# Patient Record
Sex: Female | Born: 1950 | Race: White | Hispanic: No | Smoking: Former smoker
Health system: Southern US, Community
[De-identification: ages and names within clinical notes are randomized; demographics above are authoritative.]

## PROBLEM LIST (undated history)

## (undated) DIAGNOSIS — IMO0002 Reserved for concepts with insufficient information to code with codable children: Secondary | ICD-10-CM

## (undated) DIAGNOSIS — R600 Localized edema: Secondary | ICD-10-CM

## (undated) DIAGNOSIS — E039 Hypothyroidism, unspecified: Secondary | ICD-10-CM

## (undated) DIAGNOSIS — Z9981 Dependence on supplemental oxygen: Secondary | ICD-10-CM

## (undated) DIAGNOSIS — C801 Malignant (primary) neoplasm, unspecified: Secondary | ICD-10-CM

## (undated) DIAGNOSIS — I10 Essential (primary) hypertension: Secondary | ICD-10-CM

## (undated) DIAGNOSIS — J449 Chronic obstructive pulmonary disease, unspecified: Secondary | ICD-10-CM

## (undated) DIAGNOSIS — F419 Anxiety disorder, unspecified: Secondary | ICD-10-CM

## (undated) DIAGNOSIS — G8929 Other chronic pain: Secondary | ICD-10-CM

## (undated) DIAGNOSIS — E119 Type 2 diabetes mellitus without complications: Secondary | ICD-10-CM

## (undated) DIAGNOSIS — M549 Dorsalgia, unspecified: Secondary | ICD-10-CM

## (undated) DIAGNOSIS — I509 Heart failure, unspecified: Secondary | ICD-10-CM

## (undated) DIAGNOSIS — Z9114 Patient's other noncompliance with medication regimen: Secondary | ICD-10-CM

## (undated) DIAGNOSIS — I2510 Atherosclerotic heart disease of native coronary artery without angina pectoris: Secondary | ICD-10-CM

## (undated) HISTORY — PX: TUBAL LIGATION: SHX77

## (undated) HISTORY — PX: HERNIA REPAIR: SHX51

## (undated) HISTORY — DX: Atherosclerotic heart disease of native coronary artery without angina pectoris: I25.10

---

## 1999-01-23 ENCOUNTER — Ambulatory Visit (HOSPITAL_COMMUNITY): Admission: RE | Admit: 1999-01-23 | Discharge: 1999-01-23 | Payer: Self-pay

## 2001-03-10 ENCOUNTER — Emergency Department (HOSPITAL_COMMUNITY): Admission: EM | Admit: 2001-03-10 | Discharge: 2001-03-10 | Payer: Self-pay | Admitting: Emergency Medicine

## 2001-08-13 ENCOUNTER — Ambulatory Visit (HOSPITAL_COMMUNITY): Admission: RE | Admit: 2001-08-13 | Discharge: 2001-08-13 | Payer: Self-pay | Admitting: Obstetrics and Gynecology

## 2001-08-13 ENCOUNTER — Encounter: Payer: Self-pay | Admitting: Family Medicine

## 2001-08-18 ENCOUNTER — Ambulatory Visit (HOSPITAL_COMMUNITY): Admission: RE | Admit: 2001-08-18 | Discharge: 2001-08-18 | Payer: Self-pay | Admitting: Family Medicine

## 2001-08-18 ENCOUNTER — Encounter: Payer: Self-pay | Admitting: Family Medicine

## 2001-11-14 ENCOUNTER — Emergency Department (HOSPITAL_COMMUNITY): Admission: EM | Admit: 2001-11-14 | Discharge: 2001-11-14 | Payer: Self-pay | Admitting: Emergency Medicine

## 2001-11-14 ENCOUNTER — Encounter: Payer: Self-pay | Admitting: Emergency Medicine

## 2001-12-31 ENCOUNTER — Encounter: Payer: Self-pay | Admitting: Family Medicine

## 2001-12-31 ENCOUNTER — Ambulatory Visit (HOSPITAL_COMMUNITY): Admission: RE | Admit: 2001-12-31 | Discharge: 2001-12-31 | Payer: Self-pay | Admitting: Family Medicine

## 2002-02-03 ENCOUNTER — Encounter (HOSPITAL_COMMUNITY): Admission: RE | Admit: 2002-02-03 | Discharge: 2002-03-05 | Payer: Self-pay | Admitting: Internal Medicine

## 2002-02-07 ENCOUNTER — Encounter: Payer: Self-pay | Admitting: Internal Medicine

## 2002-04-30 ENCOUNTER — Emergency Department (HOSPITAL_COMMUNITY): Admission: EM | Admit: 2002-04-30 | Discharge: 2002-04-30 | Payer: Self-pay | Admitting: *Deleted

## 2002-04-30 ENCOUNTER — Encounter: Payer: Self-pay | Admitting: *Deleted

## 2002-07-31 ENCOUNTER — Emergency Department (HOSPITAL_COMMUNITY): Admission: EM | Admit: 2002-07-31 | Discharge: 2002-07-31 | Payer: Self-pay | Admitting: Emergency Medicine

## 2002-07-31 ENCOUNTER — Encounter: Payer: Self-pay | Admitting: Emergency Medicine

## 2002-12-08 ENCOUNTER — Emergency Department (HOSPITAL_COMMUNITY): Admission: EM | Admit: 2002-12-08 | Discharge: 2002-12-08 | Payer: Self-pay | Admitting: Emergency Medicine

## 2003-02-22 ENCOUNTER — Ambulatory Visit (HOSPITAL_COMMUNITY): Admission: RE | Admit: 2003-02-22 | Discharge: 2003-02-22 | Payer: Self-pay | Admitting: Obstetrics and Gynecology

## 2003-02-22 ENCOUNTER — Encounter: Payer: Self-pay | Admitting: Obstetrics and Gynecology

## 2003-04-23 ENCOUNTER — Emergency Department (HOSPITAL_COMMUNITY): Admission: EM | Admit: 2003-04-23 | Discharge: 2003-04-24 | Payer: Self-pay | Admitting: *Deleted

## 2003-07-03 ENCOUNTER — Emergency Department (HOSPITAL_COMMUNITY): Admission: EM | Admit: 2003-07-03 | Discharge: 2003-07-03 | Payer: Self-pay | Admitting: Emergency Medicine

## 2003-07-07 ENCOUNTER — Emergency Department (HOSPITAL_COMMUNITY): Admission: EM | Admit: 2003-07-07 | Discharge: 2003-07-07 | Payer: Self-pay | Admitting: Emergency Medicine

## 2003-12-30 ENCOUNTER — Emergency Department (HOSPITAL_COMMUNITY): Admission: EM | Admit: 2003-12-30 | Discharge: 2003-12-30 | Payer: Self-pay | Admitting: Emergency Medicine

## 2004-01-04 ENCOUNTER — Emergency Department (HOSPITAL_COMMUNITY): Admission: EM | Admit: 2004-01-04 | Discharge: 2004-01-05 | Payer: Self-pay | Admitting: Emergency Medicine

## 2004-01-06 ENCOUNTER — Emergency Department (HOSPITAL_COMMUNITY): Admission: EM | Admit: 2004-01-06 | Discharge: 2004-01-07 | Payer: Self-pay | Admitting: *Deleted

## 2004-01-10 ENCOUNTER — Emergency Department (HOSPITAL_COMMUNITY): Admission: EM | Admit: 2004-01-10 | Discharge: 2004-01-11 | Payer: Self-pay | Admitting: *Deleted

## 2004-01-15 ENCOUNTER — Emergency Department (HOSPITAL_COMMUNITY): Admission: EM | Admit: 2004-01-15 | Discharge: 2004-01-15 | Payer: Self-pay | Admitting: Emergency Medicine

## 2004-05-07 ENCOUNTER — Emergency Department (HOSPITAL_COMMUNITY): Admission: EM | Admit: 2004-05-07 | Discharge: 2004-05-08 | Payer: Self-pay | Admitting: Internal Medicine

## 2004-07-21 ENCOUNTER — Inpatient Hospital Stay (HOSPITAL_COMMUNITY): Admission: EM | Admit: 2004-07-21 | Discharge: 2004-07-25 | Payer: Self-pay | Admitting: Emergency Medicine

## 2004-07-22 ENCOUNTER — Ambulatory Visit: Payer: Self-pay | Admitting: *Deleted

## 2005-06-04 ENCOUNTER — Emergency Department (HOSPITAL_COMMUNITY): Admission: EM | Admit: 2005-06-04 | Discharge: 2005-06-04 | Payer: Self-pay | Admitting: Emergency Medicine

## 2005-06-07 ENCOUNTER — Emergency Department (HOSPITAL_COMMUNITY): Admission: EM | Admit: 2005-06-07 | Discharge: 2005-06-07 | Payer: Self-pay | Admitting: Emergency Medicine

## 2005-06-08 ENCOUNTER — Emergency Department (HOSPITAL_COMMUNITY): Admission: EM | Admit: 2005-06-08 | Discharge: 2005-06-08 | Payer: Self-pay | Admitting: Emergency Medicine

## 2006-04-12 ENCOUNTER — Emergency Department (HOSPITAL_COMMUNITY): Admission: EM | Admit: 2006-04-12 | Discharge: 2006-04-12 | Payer: Self-pay | Admitting: Emergency Medicine

## 2006-09-04 ENCOUNTER — Emergency Department (HOSPITAL_COMMUNITY): Admission: EM | Admit: 2006-09-04 | Discharge: 2006-09-05 | Payer: Self-pay | Admitting: Emergency Medicine

## 2008-07-26 ENCOUNTER — Ambulatory Visit: Payer: Self-pay | Admitting: Cardiology

## 2009-12-19 ENCOUNTER — Emergency Department (HOSPITAL_COMMUNITY): Admission: EM | Admit: 2009-12-19 | Discharge: 2009-12-19 | Payer: Self-pay | Admitting: Emergency Medicine

## 2010-06-02 ENCOUNTER — Encounter: Payer: Self-pay | Admitting: Orthopedic Surgery

## 2010-06-02 ENCOUNTER — Encounter: Payer: Self-pay | Admitting: Obstetrics and Gynecology

## 2010-06-03 ENCOUNTER — Encounter: Payer: Self-pay | Admitting: Internal Medicine

## 2010-08-12 ENCOUNTER — Other Ambulatory Visit (HOSPITAL_COMMUNITY): Payer: Self-pay | Admitting: Internal Medicine

## 2010-08-12 DIAGNOSIS — N644 Mastodynia: Secondary | ICD-10-CM

## 2010-08-21 ENCOUNTER — Ambulatory Visit (HOSPITAL_COMMUNITY): Payer: Medicare Other

## 2010-08-28 ENCOUNTER — Ambulatory Visit (HOSPITAL_COMMUNITY): Payer: Medicare Other

## 2010-09-04 ENCOUNTER — Ambulatory Visit (HOSPITAL_COMMUNITY): Admission: RE | Admit: 2010-09-04 | Payer: Medicare Other | Source: Ambulatory Visit

## 2010-09-27 NOTE — H&P (Signed)
Stephanie Combs, PEARSE               ACCOUNT NO.:  [id number]   MEDICAL RECORD NO.:  [id number]          PATIENT TYPE:  EMS   LOCATION:  ED                            FACILITY:  [facility]   PHYSICIAN:  Osvaldo Shipper, MD     DATE OF BIRTH:  [birth year]   DATE OF ADMISSION:  07/21/2004  DATE OF DISCHARGE:  LH                                HISTORY & PHYSICAL   PRIMARY CARE PHYSICIAN:  Dr. Shirley Muscat, [place], [place].   ADMITTING DIAGNOSES:  1.  Chronic obstructive pulmonary disease exacerbation.  2.  Diabetes.  3.  Hypertension.  4.  Hypothyroidism.  5.  Obesity.   CHIEF COMPLAINT:  Shortness of breath and fever for two days.   HISTORY OF PRESENT ILLNESS:  The patient is a 60 year old white female with  a past medical history of hypertension, diabetes, hypothyroidism, obesity,  who presents to the emergency room after being brought in by EMS with  complaints of progressive shortness of breath and fever starting about two  nights ago.  The patient says that she was apparently well until about two  days ago when she started noticing that she was having difficulty breathing  with minimal activity.  The patient does use home O2 at baseline, and she  does use a walker to ambulate at home.  She noticed that along with her  shortness of breath, she was also having temperatures.  She mentioned at one  point, she had a temperature of 105.  She also mentioned a history of body  aches and headache.  The patient also gives a history of cough, but with no  expectoration.  The patient gives a history of a six-minute episode of chest  tightness in the retrosternal area with no radiation, and which apparently  went away by itself last night.  There was no history or nausea of vomiting.  No history of diaphoresis.  She did have one episode of diarrhea this  morning; otherwise, no abdominal pain.  The patient mentioned that she had  leg swelling at baseline, but has not noticed any change or increase  in the  same.  The patient does not mention any kind of sick contacts recently.  She  does not give any history of any dysuria or hematuria.  No history of any  other specific joint pains or back pain.   MEDICATIONS:  1.  She is on Synthroid 300 micrograms daily.  2.  Glucovance 2.5/500 b.i.d.  3.  Cardizem 360 mg daily.  4.  Xanax 2 mg three times daily.  5.  Hydrochlorothiazide 25 mg daily.  6.  Prevacid 40 mg daily.  7.  Quinine sulfate unknown dose every four hours for leg cramps.  8.  Albuterol nebulizers up to three times a day as needed.  9.  Home O2 at 2.5 liters per minute.  10. Lasix 40 mg when she thinks her leg swelling is increasing.   ALLERGIES:  She is allergic to Actifed, Phenergan and Vistaril which causes  her to be sick.   PAST MEDICAL HISTORY:  1.  Significant for  diabetes which she has had for about 15 years, and      controlled apparently on oral medications.  2.  Hypertension for about five years.  3.  Hypothyroidism for about 15 years.  4.  Chronic obstructive pulmonary disease for about five years.   SURGICAL HISTORY:  Includes tubal ligation, history of a growth on her ovary  which was removed.  She also has a history of hernia repair.  She also gives  a history of several warts on her skin.   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  She is disabled.  She quit smoking one month ago, and she was  smoking about two packs per day, and had cut down to about eight cigarettes  per day lately.  She has been smoking for about 25 years.  No alcohol use,  and no other illicit drug use.   FAMILY HISTORY:  Father died at the age of 37 of a heart attack apparently.  Mother died at the age of 81 of a stroke.  One of her sisters died  postpartum.  The other sister had cerebral palsy.  One of her other brothers  also had cerebral palsy who has  passed away.  Her second brother has  premature coronary artery disease, and also had diabetes.   REVIEW OF SYSTEMS:  A 10-point review of systems was done which was  unremarkable.  The patient is very drowsy from all the medication that she  has received in the ER, but she is able to wake up and give appropriate  answers when asked.   PHYSICAL EXAMINATION:  VITAL SIGNS:  Temperature on arrival to the ER was  101.3 orally.  Last measured temperature is 100.6.  Blood pressure 106/64.  It was 165/72 on arrival to the ER.  Heart rate is about 90 beats per minute  and regular.  Respiratory rate is about 20 breaths per minute.  She is  saturating about 93% on two liters nasal cannula.  GENERAL:  This is a very drowsy, obese female, in no apparent distress.  She  does wake up on asking questions.  HEENT:  There is no pallor, no icterus, no cyanosis.  There is no oral  lesions seen. Mucous membranes are moist.  NECK:  Soft and supple.  LUNGS:  Very reduced air entry bilaterally over most of the lung fields.  However, no crackles are heard. A few end-expiratory wheezes are heard  bilaterally.  CARDIOVASCULAR:  Exam, S1, S2 normal, regular.  No S3, S4 is heard.  No  murmurs are appreciated.  No JVD is seen.  No carotid bruits are  appreciated.  ABDOMEN:  Very obese; however, nontender, nondistended.  No organomegaly is  appreciated.  Bowel sounds are heard.  EXTREMITIES:  Extremely edematous; however, most of it appears to be chronic  in nature with skin changes on the dorsal side of her left leg.  Peripheral  pulses are palpable.   LABORATORY DATA:  The patient had a CBC done which shows white count of 9.9  with neutrophil count of about 84%.  Hemoglobin is 12 with MCV of 86.7.  Platelet count is 259,000.   ABG was done which showed a pH of 7.32, pCO2 of 52, pO2 of 71, bicarbonate 26, saturating 92%.  This was done on about two liters of nasal cannula.  A chemistry profile was  unremarkable except for glucose of 173.  Her LFT's  were also unremarkable.  She had albumin of 3.1.  Two troponins done within  a period of an hour were negative.  A BNP was done which was 68.   EKG showed normal sinus rhythm.  All the intervals appeared normal.  No Q  waves were seen.  No T wave changes were seen. ST segments were also within  normal limits.   Chest x-ray showed emphysematous changes; otherwise, no findings of  pulmonary edema or any specific consolidation is appreciated.   IMPRESSION:  This is a 60 year old white female with a history of diabetes,  hypertension, chronic obstructive pulmonary disease, hypothyroidism who  presents with a two-day history of progressive shortness of breath along  with fever and generalized body aches.  It appears currently the patient  seems to have an exacerbation of her chronic obstructive pulmonary disease.  It is possible that the patient might have started out with some flu-like  symptoms.  There is no clear infiltrate seen on her x-ray.  The patient also  had about six-minute episode of chest tightness which resolved  spontaneously.   PLAN:  1.  Chronic obstructive pulmonary disease exacerbation.  We will put the      patient on steroids.  We will start the patient on nebulizer treatment      every three hours, and then we will cut down.  We will also start the      patient on some antibiotics.  We will also continue the patient on her      oxygen.  2.  Cardiac. The patient does not have any documented history of coronary      artery disease nor any history of congestive heart failure.  Her BNP is      within normal limits.  This is not CHF presentation at this time.  We      will, however, rule the patient out because of her episode of chest pain      that happened last night.  However, it appears it was more secondary to      her chronic obstructive pulmonary disease and all the coughing spells      that she had been having.   The patient also does not have any EKG      changes.  We will obtain a 2-D echocardiogram to evaluate her left heart      function.  3.  Diabetes.  We will continue the patient on her home medications.  We      will also check her fingersticks every a.c. and h.s.  We will cover her      with sliding scale insulin.  We will also check her HB A1c to get a      measure of how well her diabetes is controlled.  4.  Hypertension.  Continue the patient on her antihypertensive at this      time.  5.  Hypothyroidism.  We will continue the patient on Synthroid. We will      check a TSH and a free T4.  6.  We will put the patient on DVT prophylaxis with Lovenox.  7.  Diet:  We will put the patient on an 1800 calorie ADA diet.  8.  Further management decisions will be made on patient's response to this      initial treatment.      GK/MEDQ  D:  07/21/2004  T:  07/21/2004  Job:  [id number]  cc:   Sande Brothers, M.D.  [place], [place]

## 2010-09-27 NOTE — Procedures (Signed)
NAMESHIANE, WENBERG               ACCOUNT NO.:  [id number]   MEDICAL RECORD NO.:  [id number]          PATIENT TYPE:  INP   LOCATION:  A208                          FACILITY:  [facility]   PHYSICIAN:  Vida Roller, M.D.   DATE OF BIRTH:  [birth year]   DATE OF PROCEDURE:  07/22/2004  DATE OF DISCHARGE:                                  ECHOCARDIOGRAM   PRIMARY CARE PHYSICIAN:  Osvaldo Shipper, MD   TAPE:  LB-6-12.   TAPE COUNT:  3131 through 3633.   This is a 60 year old woman with chest pain, shortness of breath, and  congestive heart failure.  The technical quality of the study is difficult.   M-MODE TRACINGS:  The aorta is 31 mm.  Left atrium is 50 mm.  The septum is  15 mm.  The posterior wall is 14 mm.  Left ventricular diastolic dimension  is 42 mm.  Left ventricular systolic dimension is 29 mm.   2D AND DOPPLER IMAGING:  The left ventricle is normal size with normal  systolic function.  Estimated ejection fraction is 65-70%.  There is mild  concentric left ventricular hypertrophy.   The right ventricle is top-normal in size with normal systolic function.  There is biatrial enlargement.   There is no pericardial effusion.   The aortic valve is sclerotic with trace aortic insufficiency.  No stenosis  is seen.   The mitral valve has mild annular calcification with mild regurgitation.  No  stenosis is seen.   There is mild-to-moderate tricuspid regurgitation with an increased right  ventricular systolic pressure between 45-50 mmHg.   The inferior vena cava is enlarged.  Ascending aorta not well seen.      JH/MEDQ  D:  07/22/2004  T:  07/22/2004  Job:  [id number]

## 2010-09-27 NOTE — Discharge Summary (Signed)
NAMEAUREA, ARONOV               ACCOUNT NO.:  [id number]   MEDICAL RECORD NO.:  [id number]          PATIENT TYPE:  INP   LOCATION:  A331                          FACILITY:  [facility]   PHYSICIAN:  Osvaldo Shipper, MD     DATE OF BIRTH:  1950/11/04   DATE OF ADMISSION:  07/21/2004  DATE OF DISCHARGE:  03/16/2006LH                                 DISCHARGE SUMMARY   DISCHARGE DIAGNOSES:  1.  Chronic obstructive pulmonary disease exacerbation.  2.  Diabetes.  3.  Hypertension.  4.  Severe anxiety disorder.  5.  Hypothyroidism.  6.  Obesity.   Please review the H&P dictated March 12 for details regarding present  illness.   BRIEF HOSPITAL COURSE:  This is a 60 year old white female with multiple  medical problems who presented to the emergency room with progressive  shortness of breath.  1.  COPD exacerbation.  The patient was thought to have exacerbation of her      COPD based on clinical examination.  She was also running a low-grade      temperature.  The patient was started on IV Solu-Medrol, antibiotics,      and given nebulizer treatments.  With this regimen, the patient has      improved significantly.  She is ambulating in the hall without any      difficulty.  The patient does use home O2.  2.  Diabetes.  The patient was monitored during this hospital stay for her      diabetic care.  Her CBGs were obtained q.a.c. and h.s.  Because of      steroid use, her CBG seemed to going high.  As a result, her medications      were adjusted.  Her HBA1C came back at about 7, implying poor control.  3.  Hypertension.  The patient was continued on her home medications, and      her blood pressure was controlled during the stay.  4.  Hypothyroidism.  She will be continued on her Synthroid, and it was not      an active issue.  5.  Lower extremity edema.  The patient takes Lasix on and off for her lower      extremity edema.  At the time of admission, she has significant edema.      We  started her on initially IV Lasix followed by p.o. Lasix.  She      responded adequately to this treatment, and her leg swelling subsided.  6.  Smoking.  The patient wishes to try smoking cessation, and she was      requesting a nicotine patch.  She said she has not been smoking for a      month, but is finding it very difficulty not to be able to do so.  As a      result, we put her on a nicotine patch.   DISCHARGE MEDICATIONS:  We are discharging the patient on the following  medications -  1.  Quinine sulfate 325 mg p.o. b.i.d.  2.  Nicotine patch 14 mg per day  patch topically daily.  3.  Prednisone taper 40 mg daily. for 5 days, followed by 20 mg for 5 days,      followed by 10 mg for 5 days.  4.  Levofloxacin 500 mg daily for 3 more days.  5.  Advair inhaler 250/50 inhaled b.i.d.  6.  Lasix 40 mg daily.  7.  Prevacid 40 mg daily.  8.  Hydrochlorothiazide 25 mg daily.  9.  Cardizem CD 360 mg daily.  10. Glucovance 5/500 two tablets p.o. b.i.d.  11. Synthroid 300 mcg daily.  12. Albuterol nebulizer 2.5 q.6h.  13. The patient also needs to be on home O2, which she has available.   DISCHARGE DIET:  1800 calorie ADA diet.   PHYSICAL EXAMINATION:  As tolerated.   PROCEDURES DURING THIS ADMISSION:  None.   FOLLOW UP CARE:  The patient needs to see her primary doctor in about 1-2  weeks.      GK/MEDQ  D:  07/25/2004  T:  07/25/2004  Job:  [id number]

## 2012-02-25 ENCOUNTER — Encounter (HOSPITAL_COMMUNITY): Payer: Self-pay | Admitting: *Deleted

## 2012-02-25 ENCOUNTER — Emergency Department (HOSPITAL_COMMUNITY)
Admission: EM | Admit: 2012-02-25 | Discharge: 2012-02-25 | Disposition: A | Discharge status: Home / Self Care | Payer: Medicare Other | Attending: Emergency Medicine | Admitting: Emergency Medicine

## 2012-02-25 DIAGNOSIS — R110 Nausea: Secondary | ICD-10-CM | POA: Insufficient documentation

## 2012-02-25 DIAGNOSIS — I10 Essential (primary) hypertension: Secondary | ICD-10-CM | POA: Insufficient documentation

## 2012-02-25 DIAGNOSIS — R109 Unspecified abdominal pain: Secondary | ICD-10-CM | POA: Insufficient documentation

## 2012-02-25 DIAGNOSIS — E079 Disorder of thyroid, unspecified: Secondary | ICD-10-CM | POA: Insufficient documentation

## 2012-02-25 DIAGNOSIS — E669 Obesity, unspecified: Secondary | ICD-10-CM | POA: Insufficient documentation

## 2012-02-25 HISTORY — DX: Essential (primary) hypertension: I10

## 2012-02-25 HISTORY — DX: Type 2 diabetes mellitus without complications: E11.9

## 2012-02-25 LAB — COMPREHENSIVE METABOLIC PANEL
ALT: <5 U/L (ref 0–35)
AST: 12 U/L (ref 0–37)
Albumin: 3.5 g/dL (ref 3.5–5.2)
Alkaline Phosphatase: 124 U/L — ABNORMAL HIGH (ref 39–117)
BUN: 23 mg/dL (ref 6–23)
CO2: 27 mEq/L (ref 19–32)
Calcium: 9.5 mg/dL (ref 8.4–10.5)
Chloride: 103 mEq/L (ref 96–112)
Creatinine, Ser: 0.99 mg/dL (ref 0.50–1.10)
GFR calc Af Amer: 69 mL/min — ABNORMAL LOW (ref >90)
GFR calc non Af Amer: 60 mL/min — ABNORMAL LOW (ref >90)
Glucose, Bld: 108 mg/dL — ABNORMAL HIGH (ref 70–99)
Potassium: 4.2 mEq/L (ref 3.5–5.1)
Sodium: 138 mEq/L (ref 135–145)
Total Bilirubin: 0.2 mg/dL — ABNORMAL LOW (ref 0.3–1.2)
Total Protein: 6.9 g/dL (ref 6.0–8.3)

## 2012-02-25 LAB — CBC WITH DIFFERENTIAL/PLATELET
Basophils Absolute: 0.1 10*3/uL (ref 0.0–0.1)
Basophils Relative: 1 % (ref 0–1)
Eosinophils Absolute: 0.2 10*3/uL (ref 0.0–0.7)
Eosinophils Relative: 3 % (ref 0–5)
HCT: 37.1 % (ref 36.0–46.0)
Hemoglobin: 12.3 g/dL (ref 12.0–15.0)
Lymphocytes Relative: 26 % (ref 12–46)
Lymphs Abs: 2.3 10*3/uL (ref 0.7–4.0)
MCH: 27.3 pg (ref 26.0–34.0)
MCHC: 33.2 g/dL (ref 30.0–36.0)
MCV: 82.3 fL (ref 78.0–100.0)
Monocytes Absolute: 0.8 10*3/uL (ref 0.1–1.0)
Monocytes Relative: 9 % (ref 3–12)
Neutro Abs: 5.3 10*3/uL (ref 1.7–7.7)
Neutrophils Relative %: 61 % (ref 43–77)
Platelets: 334 10*3/uL (ref 150–400)
RBC: 4.51 MIL/uL (ref 3.87–5.11)
RDW: 14.8 % (ref 11.5–15.5)
WBC: 8.8 10*3/uL (ref 4.0–10.5)

## 2012-02-25 LAB — URINALYSIS, ROUTINE W REFLEX MICROSCOPIC
Bilirubin Urine: NEGATIVE
Glucose, UA: 100 mg/dL — AB
Ketones, ur: NEGATIVE mg/dL
Leukocytes, UA: NEGATIVE
Nitrite: NEGATIVE
Protein, ur: NEGATIVE mg/dL
Specific Gravity, Urine: >1.03 — ABNORMAL HIGH (ref 1.005–1.030)
Urobilinogen, UA: 0.2 mg/dL (ref 0.0–1.0)
pH: 5.5 (ref 5.0–8.0)

## 2012-02-25 LAB — GLUCOSE, CAPILLARY: Glucose-Capillary: 97 mg/dL (ref 70–99)

## 2012-02-25 LAB — LIPASE, BLOOD: Lipase: 140 U/L — ABNORMAL HIGH (ref 11–59)

## 2012-02-25 LAB — URINE MICROSCOPIC-ADD ON

## 2012-02-25 LAB — LACTIC ACID, PLASMA: Lactic Acid, Venous: 1.4 mmol/L (ref 0.5–2.2)

## 2012-02-25 MED ORDER — HYDROCODONE-ACETAMINOPHEN 5-325 MG PO TABS: ORAL_TABLET | ORAL | Status: DC

## 2012-02-25 MED ORDER — MORPHINE SULFATE 4 MG/ML IJ SOLN
4.0000 mg | Freq: Once | INTRAMUSCULAR | Status: AC
  Administered 2012-02-25: 4 mg via INTRAVENOUS
  Filled 2012-02-25: qty 1

## 2012-02-25 MED ORDER — ONDANSETRON 8 MG PO TBDP
8.0000 mg | ORAL_TABLET | Freq: Once | ORAL | Status: AC
  Administered 2012-02-25: 8 mg via ORAL
  Filled 2012-02-25: qty 1

## 2012-02-25 MED ORDER — ONDANSETRON HCL 4 MG PO TABS: 4.0000 mg | ORAL_TABLET | Freq: Four times a day (QID) | ORAL | Status: DC

## 2012-02-25 MED ORDER — HYDROCODONE-ACETAMINOPHEN 5-325 MG PO TABS
1.0000 | ORAL_TABLET | Freq: Once | ORAL | Status: AC
  Administered 2012-02-25: 1 via ORAL
  Filled 2012-02-25: qty 1

## 2012-02-25 MED ORDER — SODIUM CHLORIDE 0.9 % IV BOLUS (SEPSIS)
500.0000 mL | Freq: Once | INTRAVENOUS | Status: AC
  Administered 2012-02-25: 10:00:00 via INTRAVENOUS

## 2012-02-25 MED ORDER — ONDANSETRON HCL 4 MG/2ML IJ SOLN
4.0000 mg | Freq: Once | INTRAMUSCULAR | Status: AC
  Administered 2012-02-25: 4 mg via INTRAVENOUS
  Filled 2012-02-25: qty 2

## 2012-02-25 NOTE — ED Notes (Signed)
CBG 97 

## 2012-02-25 NOTE — ED Provider Notes (Signed)
History     CSN: [id number]  Arrival date & time 02/25/12  0906   First MD Initiated Contact with Patient 02/25/12 0911      Chief Complaint  Patient presents with  . Nausea  . Abdominal Pain    (Consider location/radiation/quality/duration/timing/severity/associated sxs/prior treatment) HPI Comments: Patient states she woke up at 2:00 am this morning with sudden onset of nausea and sharp abdominal pain.  States that she ate two hot dogs at 9:00 pm last evening at a social gathering and she believes that is the cause of her pain and nausea.  She used a suppository and took a stool softener and had a watery BM after w/o relief.  She states she had similar symptoms after eating "bad lasagna" several years ago.  She denies vomiting, recent abd surgery, fever, chest pain, or shortness of breath. She has hx of DM and has not taken any of her am medications.    PMD is Dr. Robynn Pane in [place]  Patient is a 61 y.o. female presenting with abdominal pain. The history is provided by the patient.  Abdominal Pain The primary symptoms of the illness include abdominal pain and nausea. The primary symptoms of the illness do not include fever, fatigue, shortness of breath, vomiting, diarrhea, hematemesis or dysuria. The current episode started 6 to 12 hours ago. The onset of the illness was sudden. The problem has not changed since onset. The pain came on suddenly. The abdominal pain has been unchanged since its onset. The abdominal pain is generalized. The abdominal pain does not radiate. The abdominal pain is relieved by nothing. Exacerbated by: nothing.  The illness is associated with eating. The patient states that she believes she is currently not pregnant. The patient has not had a change in bowel habit. Risk factors: diabetic. Additional symptoms associated with the illness include chills. Symptoms associated with the illness do not include anorexia, diaphoresis, heartburn, constipation, urgency,  hematuria, frequency or back pain. Significant associated medical issues include diabetes. Significant associated medical issues do not include substance abuse or cardiac disease.    Past Medical History  Diagnosis Date  . Thyroid disease   . Hypertension   . Diabetes mellitus without complication   . Fluid retention     Past Surgical History  Procedure Date  . Tubal ligation     No family history on file.  History  Substance Use Topics  . Smoking status: Current Every Day Smoker    Types: Cigarettes  . Smokeless tobacco: Not on file  . Alcohol Use: No    OB History    Grav Para Term Preterm Abortions TAB SAB Ect Mult Living                  Review of Systems  Constitutional: Positive for chills and appetite change. Negative for fever, diaphoresis and fatigue.  HENT: Negative for neck pain and neck stiffness.   Respiratory: Negative for chest tightness and shortness of breath.   Cardiovascular: Negative for chest pain and leg swelling.  Gastrointestinal: Positive for nausea and abdominal pain. Negative for heartburn, vomiting, diarrhea, constipation, blood in stool, abdominal distention, anorexia and hematemesis.  Genitourinary: Negative for dysuria, urgency, frequency, hematuria and difficulty urinating.  Musculoskeletal: Negative for back pain.  Skin: Negative for rash.  Neurological: Negative for dizziness, speech difficulty, weakness and numbness.  All other systems reviewed and are negative.    Allergies  Dilaudid; Oxycontin; Reglan; Valium; and Vistaril  Home Medications  No current outpatient prescriptions  on file.  BP 169/70  Pulse 95  Temp 97.9 F (36.6 C) (Oral)  Resp 20  Ht 5\' 6"  (1.676 m)  Wt 238 lb (107.956 kg)  BMI 38.41 kg/m2  SpO2 91%  Physical Exam  Nursing note and vitals reviewed. Constitutional: She is oriented to person, place, and time. She appears well-developed and well-nourished. No distress.       obese  HENT:  Head:  Normocephalic and atraumatic.  Mouth/Throat: Oropharynx is clear and moist.  Cardiovascular: Normal rate, regular rhythm, normal heart sounds and intact distal pulses.   No murmur heard. Pulmonary/Chest: Effort normal and breath sounds normal. She exhibits no tenderness.  Abdominal: Soft. Bowel sounds are normal. She exhibits no distension and no mass. There is generalized tenderness. There is no rigidity, no rebound, no guarding, no CVA tenderness and no tenderness at McBurney's point.  Musculoskeletal: She exhibits no edema and no tenderness.  Neurological: She is alert and oriented to person, place, and time.  Skin: Skin is warm and dry.    ED Course  Procedures (including critical care time)  Results for orders placed during the hospital encounter of 02/25/12  GLUCOSE, CAPILLARY      Component Value Range   Glucose-Capillary 97  70 - 99 mg/dL  CBC WITH DIFFERENTIAL      Component Value Range   WBC 8.8  4.0 - 10.5 K/uL   RBC 4.51  3.87 - 5.11 MIL/uL   Hemoglobin 12.3  12.0 - 15.0 g/dL   HCT 62.9  52.8 - 41.3 %   MCV 82.3  78.0 - 100.0 fL   MCH 27.3  26.0 - 34.0 pg   MCHC 33.2  30.0 - 36.0 g/dL   RDW 24.4  01.0 - 27.2 %   Platelets 334  150 - 400 K/uL   Neutrophils Relative 61  43 - 77 %   Neutro Abs 5.3  1.7 - 7.7 K/uL   Lymphocytes Relative 26  12 - 46 %   Lymphs Abs 2.3  0.7 - 4.0 K/uL   Monocytes Relative 9  3 - 12 %   Monocytes Absolute 0.8  0.1 - 1.0 K/uL   Eosinophils Relative 3  0 - 5 %   Eosinophils Absolute 0.2  0.0 - 0.7 K/uL   Basophils Relative 1  0 - 1 %   Basophils Absolute 0.1  0.0 - 0.1 K/uL  COMPREHENSIVE METABOLIC PANEL      Component Value Range   Sodium 138  135 - 145 mEq/L   Potassium 4.2  3.5 - 5.1 mEq/L   Chloride 103  96 - 112 mEq/L   CO2 27  19 - 32 mEq/L   Glucose, Bld 108 (*) 70 - 99 mg/dL   BUN 23  6 - 23 mg/dL   Creatinine, Ser 5.36  0.50 - 1.10 mg/dL   Calcium 9.5  8.4 - 64.4 mg/dL   Total Protein 6.9  6.0 - 8.3 g/dL   Albumin 3.5   3.5 - 5.2 g/dL   AST 12  0 - 37 U/L   ALT <5  0 - 35 U/L   Alkaline Phosphatase 124 (*) 39 - 117 U/L   Total Bilirubin 0.2 (*) 0.3 - 1.2 mg/dL   GFR calc non Af Amer 60 (*) >90 mL/min   GFR calc Af Amer 69 (*) >90 mL/min  URINALYSIS, ROUTINE W REFLEX MICROSCOPIC      Component Value Range   Color, Urine YELLOW  YELLOW   APPearance  CLEAR  CLEAR   Specific Gravity, Urine >1.030 (*) 1.005 - 1.030   pH 5.5  5.0 - 8.0   Glucose, UA 100 (*) NEGATIVE mg/dL   Hgb urine dipstick MODERATE (*) NEGATIVE   Bilirubin Urine NEGATIVE  NEGATIVE   Ketones, ur NEGATIVE  NEGATIVE mg/dL   Protein, ur NEGATIVE  NEGATIVE mg/dL   Urobilinogen, UA 0.2  0.0 - 1.0 mg/dL   Nitrite NEGATIVE  NEGATIVE   Leukocytes, UA NEGATIVE  NEGATIVE  LIPASE, BLOOD      Component Value Range   Lipase 140 (*) 11 - 59 U/L  LACTIC ACID, PLASMA      Component Value Range   Lactic Acid, Venous 1.4  0.5 - 2.2 mmol/L  URINE MICROSCOPIC-ADD ON      Component Value Range   Squamous Epithelial / LPF FEW (*) RARE   RBC / HPF 7-10  <3 RBC/hpf   Bacteria, UA FEW (*) RARE         MDM     Date: 02/25/2012  Rate: 80  Rhythm: normal sinus rhythm  QRS Axis: normal  Intervals: normal  ST/T Wave abnormalities: normal  Conduction Disutrbances:none  Narrative Interpretation:   Old EKG Reviewed: unchanged  EKG read by Dr. Bebe Shaggy     Patient is resting.  Will try po challenge  Labs reviewed, bacteria in the urine is likely contaminate.  Culture pending  Patient has received IVF's and tolerated po fluids.  No vomiting during ed stay.  abd remains soft, now NT.  No guarding or rebound tenderness.  Doubt acute pancreatitis.  Sx's are most likely related to gastroenteritis.    Patient requesting to go home, will prescribe anti-emetic, norco #10.  She agrees to clear fluids, bland diet as tolerated and close f/u with her PMD.  Also agrees to return if sx's worsen  Patient also seen by EDP and care plan discussed.     Tiane Szydlowski L. [place], [place] 02/25/12 1211

## 2012-02-25 NOTE — ED Provider Notes (Signed)
Medical screening examination/treatment/procedure(s) were conducted as a shared visit with non-physician practitioner(s) and myself.  I personally evaluated the patient during the encounter  Pt well appearing, abd soft on my exam, stable for d/c home  Joya Gaskins, MD 02/25/12 1237

## 2012-02-25 NOTE — ED Notes (Signed)
C/o nausea and abd pain that started approx 0200 this morning.  Reports taking suppositories and enemas at home for constipation with no relief.  Denies vomiting.

## 2012-08-07 ENCOUNTER — Inpatient Hospital Stay (HOSPITAL_COMMUNITY)
Admission: EM | Admit: 2012-08-07 | Discharge: 2012-08-09 | DRG: 683 | Disposition: A | Discharge status: Home / Self Care | Payer: Medicare Other | Attending: Internal Medicine | Admitting: Internal Medicine

## 2012-08-07 ENCOUNTER — Emergency Department (HOSPITAL_COMMUNITY): Payer: Medicare Other

## 2012-08-07 ENCOUNTER — Encounter (HOSPITAL_COMMUNITY): Payer: Self-pay | Admitting: Emergency Medicine

## 2012-08-07 DIAGNOSIS — Z7982 Long term (current) use of aspirin: Secondary | ICD-10-CM

## 2012-08-07 DIAGNOSIS — Z79899 Other long term (current) drug therapy: Secondary | ICD-10-CM

## 2012-08-07 DIAGNOSIS — E8809 Other disorders of plasma-protein metabolism, not elsewhere classified: Secondary | ICD-10-CM | POA: Diagnosis present

## 2012-08-07 DIAGNOSIS — Z8249 Family history of ischemic heart disease and other diseases of the circulatory system: Secondary | ICD-10-CM

## 2012-08-07 DIAGNOSIS — N390 Urinary tract infection, site not specified: Secondary | ICD-10-CM | POA: Diagnosis present

## 2012-08-07 DIAGNOSIS — I10 Essential (primary) hypertension: Secondary | ICD-10-CM | POA: Diagnosis present

## 2012-08-07 DIAGNOSIS — R4182 Altered mental status, unspecified: Secondary | ICD-10-CM

## 2012-08-07 DIAGNOSIS — M549 Dorsalgia, unspecified: Secondary | ICD-10-CM | POA: Diagnosis present

## 2012-08-07 DIAGNOSIS — J440 Chronic obstructive pulmonary disease with acute lower respiratory infection: Secondary | ICD-10-CM | POA: Diagnosis present

## 2012-08-07 DIAGNOSIS — M7989 Other specified soft tissue disorders: Secondary | ICD-10-CM | POA: Diagnosis present

## 2012-08-07 DIAGNOSIS — N179 Acute kidney failure, unspecified: Principal | ICD-10-CM | POA: Diagnosis present

## 2012-08-07 DIAGNOSIS — E861 Hypovolemia: Secondary | ICD-10-CM | POA: Diagnosis present

## 2012-08-07 DIAGNOSIS — E039 Hypothyroidism, unspecified: Secondary | ICD-10-CM | POA: Diagnosis present

## 2012-08-07 DIAGNOSIS — E1169 Type 2 diabetes mellitus with other specified complication: Secondary | ICD-10-CM | POA: Diagnosis present

## 2012-08-07 DIAGNOSIS — Z823 Family history of stroke: Secondary | ICD-10-CM

## 2012-08-07 DIAGNOSIS — E079 Disorder of thyroid, unspecified: Secondary | ICD-10-CM | POA: Diagnosis present

## 2012-08-07 DIAGNOSIS — Z833 Family history of diabetes mellitus: Secondary | ICD-10-CM

## 2012-08-07 DIAGNOSIS — F17200 Nicotine dependence, unspecified, uncomplicated: Secondary | ICD-10-CM | POA: Diagnosis present

## 2012-08-07 DIAGNOSIS — I959 Hypotension, unspecified: Secondary | ICD-10-CM | POA: Diagnosis present

## 2012-08-07 DIAGNOSIS — Z6841 Body Mass Index (BMI) 40.0 and over, adult: Secondary | ICD-10-CM

## 2012-08-07 DIAGNOSIS — F411 Generalized anxiety disorder: Secondary | ICD-10-CM | POA: Diagnosis present

## 2012-08-07 DIAGNOSIS — G8929 Other chronic pain: Secondary | ICD-10-CM | POA: Diagnosis present

## 2012-08-07 DIAGNOSIS — J449 Chronic obstructive pulmonary disease, unspecified: Secondary | ICD-10-CM | POA: Diagnosis present

## 2012-08-07 DIAGNOSIS — E8779 Other fluid overload: Secondary | ICD-10-CM | POA: Diagnosis present

## 2012-08-07 DIAGNOSIS — R51 Headache: Secondary | ICD-10-CM | POA: Diagnosis present

## 2012-08-07 DIAGNOSIS — J209 Acute bronchitis, unspecified: Secondary | ICD-10-CM | POA: Diagnosis present

## 2012-08-07 DIAGNOSIS — I509 Heart failure, unspecified: Secondary | ICD-10-CM | POA: Diagnosis present

## 2012-08-07 DIAGNOSIS — Z794 Long term (current) use of insulin: Secondary | ICD-10-CM

## 2012-08-07 DIAGNOSIS — Z23 Encounter for immunization: Secondary | ICD-10-CM

## 2012-08-07 DIAGNOSIS — E860 Dehydration: Secondary | ICD-10-CM | POA: Diagnosis present

## 2012-08-07 DIAGNOSIS — E119 Type 2 diabetes mellitus without complications: Secondary | ICD-10-CM | POA: Diagnosis present

## 2012-08-07 DIAGNOSIS — E162 Hypoglycemia, unspecified: Secondary | ICD-10-CM

## 2012-08-07 HISTORY — DX: Heart failure, unspecified: I50.9

## 2012-08-07 HISTORY — DX: Chronic obstructive pulmonary disease, unspecified: J44.9

## 2012-08-07 HISTORY — DX: Anxiety disorder, unspecified: F41.9

## 2012-08-07 LAB — URINE MICROSCOPIC-ADD ON

## 2012-08-07 LAB — URINALYSIS, ROUTINE W REFLEX MICROSCOPIC
Bilirubin Urine: NEGATIVE
Glucose, UA: NEGATIVE mg/dL
Ketones, ur: NEGATIVE mg/dL
Leukocytes, UA: NEGATIVE
Nitrite: NEGATIVE
Protein, ur: NEGATIVE mg/dL
Specific Gravity, Urine: >1.03 — ABNORMAL HIGH (ref 1.005–1.030)
Urobilinogen, UA: 0.2 mg/dL (ref 0.0–1.0)
pH: 5.5 (ref 5.0–8.0)

## 2012-08-07 LAB — CBC WITH DIFFERENTIAL/PLATELET
Basophils Absolute: 0.1 10*3/uL (ref 0.0–0.1)
Basophils Relative: 1 % (ref 0–1)
Eosinophils Absolute: 0.3 10*3/uL (ref 0.0–0.7)
Eosinophils Relative: 2 % (ref 0–5)
HCT: 33.8 % — ABNORMAL LOW (ref 36.0–46.0)
Hemoglobin: 11 g/dL — ABNORMAL LOW (ref 12.0–15.0)
Lymphocytes Relative: 19 % (ref 12–46)
Lymphs Abs: 3.1 10*3/uL (ref 0.7–4.0)
MCH: 28.1 pg (ref 26.0–34.0)
MCHC: 32.5 g/dL (ref 30.0–36.0)
MCV: 86.4 fL (ref 78.0–100.0)
Monocytes Absolute: 0.9 10*3/uL (ref 0.1–1.0)
Monocytes Relative: 6 % (ref 3–12)
Neutro Abs: 11.6 10*3/uL — ABNORMAL HIGH (ref 1.7–7.7)
Neutrophils Relative %: 73 % (ref 43–77)
Platelets: 329 10*3/uL (ref 150–400)
RBC: 3.91 MIL/uL (ref 3.87–5.11)
RDW: 14.8 % (ref 11.5–15.5)
WBC: 15.9 10*3/uL — ABNORMAL HIGH (ref 4.0–10.5)

## 2012-08-07 LAB — LACTIC ACID, PLASMA: Lactic Acid, Venous: 2 mmol/L (ref 0.5–2.2)

## 2012-08-07 LAB — BASIC METABOLIC PANEL
BUN: 50 mg/dL — ABNORMAL HIGH (ref 6–23)
CO2: 25 mEq/L (ref 19–32)
Calcium: 8.9 mg/dL (ref 8.4–10.5)
Chloride: 98 mEq/L (ref 96–112)
Creatinine, Ser: 1.88 mg/dL — ABNORMAL HIGH (ref 0.50–1.10)
GFR calc Af Amer: 32 mL/min — ABNORMAL LOW (ref >90)
GFR calc non Af Amer: 28 mL/min — ABNORMAL LOW (ref >90)
Glucose, Bld: 63 mg/dL — ABNORMAL LOW (ref 70–99)
Potassium: 3.8 mEq/L (ref 3.5–5.1)
Sodium: 134 mEq/L — ABNORMAL LOW (ref 135–145)

## 2012-08-07 LAB — GLUCOSE, CAPILLARY: Glucose-Capillary: 93 mg/dL (ref 70–99)

## 2012-08-07 MED ORDER — HYDROCODONE-ACETAMINOPHEN 5-325 MG PO TABS
1.0000 | ORAL_TABLET | ORAL | Status: DC | PRN
  Administered 2012-08-08 – 2012-08-09 (×4): 1 via ORAL
  Filled 2012-08-07 (×4): qty 1

## 2012-08-07 MED ORDER — HYDROCODONE-ACETAMINOPHEN 5-325 MG PO TABS
2.0000 | ORAL_TABLET | Freq: Once | ORAL | Status: AC
  Administered 2012-08-07: 2 via ORAL
  Filled 2012-08-07: qty 2

## 2012-08-07 MED ORDER — ALPRAZOLAM 0.5 MG PO TABS
1.0000 mg | ORAL_TABLET | Freq: Three times a day (TID) | ORAL | Status: DC | PRN
  Administered 2012-08-08 – 2012-08-09 (×4): 1 mg via ORAL
  Filled 2012-08-07 (×4): qty 2

## 2012-08-07 MED ORDER — ALBUTEROL SULFATE (5 MG/ML) 0.5% IN NEBU
2.5000 mg | INHALATION_SOLUTION | Freq: Four times a day (QID) | RESPIRATORY_TRACT | Status: DC
  Administered 2012-08-08 – 2012-08-09 (×6): 2.5 mg via RESPIRATORY_TRACT
  Filled 2012-08-07 (×7): qty 0.5

## 2012-08-07 MED ORDER — ACETAMINOPHEN 325 MG PO TABS: 650.0000 mg | ORAL_TABLET | Freq: Four times a day (QID) | ORAL | Status: DC | PRN

## 2012-08-07 MED ORDER — SODIUM CHLORIDE 0.9 % IV BOLUS (SEPSIS)
500.0000 mL | Freq: Once | INTRAVENOUS | Status: AC
  Administered 2012-08-07: 500 mL via INTRAVENOUS

## 2012-08-07 MED ORDER — ACETAMINOPHEN 650 MG RE SUPP: 650.0000 mg | Freq: Four times a day (QID) | RECTAL | Status: DC | PRN

## 2012-08-07 MED ORDER — IPRATROPIUM BROMIDE 0.02 % IN SOLN
0.5000 mg | Freq: Once | RESPIRATORY_TRACT | Status: AC
  Administered 2012-08-07: 0.5 mg via RESPIRATORY_TRACT
  Filled 2012-08-07: qty 2.5

## 2012-08-07 MED ORDER — HEPARIN SODIUM (PORCINE) 5000 UNIT/ML IJ SOLN
5000.0000 [IU] | Freq: Three times a day (TID) | INTRAMUSCULAR | Status: DC
  Administered 2012-08-08 – 2012-08-09 (×4): 5000 [IU] via SUBCUTANEOUS
  Filled 2012-08-07 (×4): qty 1

## 2012-08-07 MED ORDER — IPRATROPIUM BROMIDE 0.02 % IN SOLN
0.5000 mg | Freq: Four times a day (QID) | RESPIRATORY_TRACT | Status: DC
  Administered 2012-08-07 – 2012-08-09 (×7): 0.5 mg via RESPIRATORY_TRACT
  Filled 2012-08-07 (×7): qty 2.5

## 2012-08-07 MED ORDER — ALBUTEROL SULFATE (5 MG/ML) 0.5% IN NEBU
2.5000 mg | INHALATION_SOLUTION | RESPIRATORY_TRACT | Status: DC | PRN
  Administered 2012-08-07: 2.5 mg via RESPIRATORY_TRACT

## 2012-08-07 MED ORDER — AZITHROMYCIN 250 MG PO TABS
500.0000 mg | ORAL_TABLET | Freq: Every day | ORAL | Status: DC
  Administered 2012-08-08 – 2012-08-09 (×2): 500 mg via ORAL
  Filled 2012-08-07: qty 1
  Filled 2012-08-07 (×2): qty 2

## 2012-08-07 MED ORDER — ALBUTEROL SULFATE (5 MG/ML) 0.5% IN NEBU
5.0000 mg | INHALATION_SOLUTION | Freq: Once | RESPIRATORY_TRACT | Status: AC
  Administered 2012-08-07: 5 mg via RESPIRATORY_TRACT
  Filled 2012-08-07: qty 1

## 2012-08-07 MED ORDER — DEXTROSE 5 % IV SOLN
1.0000 g | INTRAVENOUS | Status: DC
  Administered 2012-08-08: 1 g via INTRAVENOUS
  Filled 2012-08-07: qty 10

## 2012-08-07 MED ORDER — SODIUM CHLORIDE 0.9 % IV BOLUS (SEPSIS): 500.0000 mL | Freq: Once | INTRAVENOUS | Status: AC

## 2012-08-07 MED ORDER — SODIUM CHLORIDE 0.9 % IV SOLN
INTRAVENOUS | Status: AC
  Administered 2012-08-08 (×2): 1000 mL via INTRAVENOUS

## 2012-08-07 MED ORDER — ONDANSETRON 8 MG PO TBDP
8.0000 mg | ORAL_TABLET | Freq: Once | ORAL | Status: AC
  Administered 2012-08-07: 8 mg via ORAL

## 2012-08-07 MED ORDER — SODIUM CHLORIDE 0.9 % IJ SOLN
3.0000 mL | Freq: Two times a day (BID) | INTRAMUSCULAR | Status: DC
  Administered 2012-08-08 – 2012-08-09 (×3): 3 mL via INTRAVENOUS

## 2012-08-07 MED ORDER — ONDANSETRON HCL 4 MG/2ML IJ SOLN
4.0000 mg | Freq: Once | INTRAMUSCULAR | Status: AC
  Administered 2012-08-07: 4 mg via INTRAVENOUS
  Filled 2012-08-07: qty 2

## 2012-08-07 MED ORDER — DEXTROSE 5 % IV SOLN
1.0000 g | INTRAVENOUS | Status: DC
  Administered 2012-08-07: 1 g via INTRAVENOUS
  Filled 2012-08-07 (×2): qty 10

## 2012-08-07 MED ORDER — GUAIFENESIN ER 600 MG PO TB12
600.0000 mg | ORAL_TABLET | Freq: Two times a day (BID) | ORAL | Status: DC
  Administered 2012-08-08 – 2012-08-09 (×4): 600 mg via ORAL
  Filled 2012-08-07 (×4): qty 1

## 2012-08-07 MED ORDER — MORPHINE SULFATE 4 MG/ML IJ SOLN
4.0000 mg | Freq: Once | INTRAMUSCULAR | Status: AC
  Administered 2012-08-07: 4 mg via INTRAVENOUS
  Filled 2012-08-07: qty 1

## 2012-08-07 MED ORDER — SODIUM CHLORIDE 0.9 % IV SOLN: INTRAVENOUS | Status: DC

## 2012-08-07 MED ORDER — ASPIRIN EC 325 MG PO TBEC
325.0000 mg | DELAYED_RELEASE_TABLET | Freq: Every day | ORAL | Status: DC
  Administered 2012-08-08 – 2012-08-09 (×2): 325 mg via ORAL
  Filled 2012-08-07 (×2): qty 1

## 2012-08-07 MED ORDER — ONDANSETRON HCL 4 MG/2ML IJ SOLN: 4.0000 mg | Freq: Four times a day (QID) | INTRAMUSCULAR | Status: DC | PRN

## 2012-08-07 MED ORDER — ONDANSETRON HCL 4 MG PO TABS
4.0000 mg | ORAL_TABLET | Freq: Four times a day (QID) | ORAL | Status: DC | PRN
  Administered 2012-08-08: 4 mg via ORAL
  Filled 2012-08-07: qty 1

## 2012-08-07 MED ORDER — LEVOTHYROXINE SODIUM 100 MCG PO TABS
300.0000 ug | ORAL_TABLET | Freq: Every day | ORAL | Status: DC
  Administered 2012-08-08 – 2012-08-09 (×2): 300 ug via ORAL
  Filled 2012-08-07 (×2): qty 3

## 2012-08-07 NOTE — ED Provider Notes (Addendum)
History  This chart was scribed for Suzi Roots, MD by Erskine Emery, ED Scribe. This patient was seen in room APA10/APA10 and the patient's care was started at 15:26.   CSN: [id number]  Arrival date & time 08/07/12  1515   First MD Initiated Contact with Patient 08/07/12 1526      Chief Complaint  Patient presents with  . Hypotension  . Hypoglycemia    (Consider location/radiation/quality/duration/timing/severity/associated sxs/prior treatment) The history is provided by the patient and the EMS personnel. No language interpreter was used.  Stephanie Combs is a 62 y.o. female brought in by ambulance, who presents to the Emergency Department complaining of hypoglycemia and associated altered mental status. Pt's family called EMS and when the the Fire Dept arrived her cbg was at 66. EMS made her eat a PJ&J sandwich and her cbg was 248 en route. Pt reports some associated nausea, low back pain, migraines, abdominal pain, and congestion. Pt reports she had eaten 1.5 hours before this happened. She claims she took her medication as normal today and she has not changed her dosages in 15 years. She takes 2 Metformin every morning and 2 injections of Novolog/day. Pt is a smoker. She has a h/o CHF. Pt is on 2L O2 at home.   Dr. Robynn Pane in [place] is the pt's PCP.  Past Medical History  Diagnosis Date  . Thyroid disease   . Hypertension   . Diabetes mellitus without complication   . Fluid retention     Past Surgical History  Procedure Laterality Date  . Tubal ligation      History reviewed. No pertinent family history.  History  Substance Use Topics  . Smoking status: Current Every Day Smoker    Types: Cigarettes  . Smokeless tobacco: Not on file  . Alcohol Use: No    OB History   Grav Para Term Preterm Abortions TAB SAB Ect Mult Living                  Review of Systems  Constitutional: Positive for fatigue.  HENT: Positive for congestion.   Respiratory: Positive for cough  and wheezing. Negative for shortness of breath.   Cardiovascular: Negative for chest pain.  Gastrointestinal: Positive for nausea. Negative for vomiting and diarrhea.  Endocrine: Negative for polyuria.  Genitourinary: Negative for dysuria.  Musculoskeletal: Positive for back pain.  Skin: Negative for rash.  Neurological: Positive for headaches.  Psychiatric/Behavioral: Positive for confusion.  All other systems reviewed and are negative.    Allergies  Dilaudid; Oxycontin; Reglan; Valium; and Vistaril  Home Medications   Current Outpatient Rx  Name  Route  Sig  Dispense  Refill  . alprazolam (XANAX) 2 MG tablet   Oral   Take 1-2 mg by mouth 4 (four) times daily as needed. Sometimes patient only takes 1mg , and she does not take 4 daily unless needed for anxiety         . aspirin 325 MG EC tablet   Oral   Take 325 mg by mouth daily. Ecotrin OTC         . aspirin EC 81 MG tablet   Oral   Take 81 mg by mouth daily.         . benazepril (LOTENSIN) 10 MG tablet   Oral   Take 10 mg by mouth daily.         Marland Kitchen diltiazem (TIAZAC) 240 MG 24 hr capsule   Oral   Take 240 mg by  mouth daily.         . furosemide (LASIX) 20 MG tablet   Oral   Take 10 mg by mouth at bedtime as needed. Only takes if leg swelling did not go down during day         . glyBURIDE-metformin (GLUCOVANCE) 5-500 MG per tablet   Oral   Take 2 tablets by mouth 2 (two) times daily with a meal.         . HYDROcodone-acetaminophen (NORCO/VICODIN) 5-325 MG per tablet      Take one-two tabs po q 4-6 hrs prn pain   10 tablet   0   . insulin aspart (NOVOLOG) 100 UNIT/ML injection   Subcutaneous   Inject 20 Units into the skin 2 (two) times daily. Takes back to back with Levemir         . insulin detemir (LEVEMIR) 100 UNIT/ML injection   Subcutaneous   Inject 40 Units into the skin 2 (two) times daily.         Marland Kitchen levothyroxine (SYNTHROID, LEVOTHROID) 300 MCG tablet   Oral   Take 300 mcg by  mouth daily.         . ondansetron (ZOFRAN) 4 MG tablet   Oral   Take 1 tablet (4 mg total) by mouth every 6 (six) hours. Prn nausea/vomiting   12 tablet   0   . torsemide (DEMADEX) 20 MG tablet   Oral   Take 20 mg by mouth 2 (two) times daily.           Triage Vitals: BP 111/65  Pulse 80  Temp(Src) 97.5 F (36.4 C) (Oral)  Resp 24  Ht 5\' 6"  (1.676 m)  Wt 228 lb (103.42 kg)  BMI 36.82 kg/m2  SpO2 98%  Physical Exam  Nursing note and vitals reviewed. Constitutional: She is oriented to person, place, and time. She appears well-developed and well-nourished. No distress.  HENT:  Head: Normocephalic and atraumatic.  Nose: Nose normal.  Mouth/Throat: Oropharynx is clear and moist.  Eyes: Conjunctivae and EOM are normal. Pupils are equal, round, and reactive to light.  Neck: Neck supple. No tracheal deviation present.  Cardiovascular: Normal rate, regular rhythm, normal heart sounds and intact distal pulses.   Pulmonary/Chest: Effort normal. No respiratory distress. She has wheezes.  Mild wheezing.  Abdominal: Soft. Bowel sounds are normal. She exhibits no distension and no mass. There is no tenderness. There is no rebound and no guarding.  Genitourinary:  No cva tenderness  Musculoskeletal: Normal range of motion. She exhibits edema. She exhibits no tenderness.  Bilateral leg edema to thighs, symmetric. Pt states is chronic.   Neurological: She is alert and oriented to person, place, and time.  Skin: Skin is warm and dry.  Psychiatric: She has a normal mood and affect.    ED Course  Procedures (including critical care time) DIAGNOSTIC STUDIES: Oxygen Saturation is 98% on room air, normal by my interpretation.    COORDINATION OF CARE: 15:28--I evaluated the patient and we discussed a treatment plan including chest x-ray and pain medication to which the pt agreed.    Results for orders placed during the hospital encounter of 08/07/12  CBC WITH DIFFERENTIAL       Result Value Range   WBC 15.9 (*) 4.0 - 10.5 K/uL   RBC 3.91  3.87 - 5.11 MIL/uL   Hemoglobin 11.0 (*) 12.0 - 15.0 g/dL   HCT 86.5 (*) 78.4 - 69.6 %   MCV 86.4  78.0 -  100.0 fL   MCH 28.1  26.0 - 34.0 pg   MCHC 32.5  30.0 - 36.0 g/dL   RDW 16.1  09.6 - 04.5 %   Platelets 329  150 - 400 K/uL   Neutrophils Relative 73  43 - 77 %   Neutro Abs 11.6 (*) 1.7 - 7.7 K/uL   Lymphocytes Relative 19  12 - 46 %   Lymphs Abs 3.1  0.7 - 4.0 K/uL   Monocytes Relative 6  3 - 12 %   Monocytes Absolute 0.9  0.1 - 1.0 K/uL   Eosinophils Relative 2  0 - 5 %   Eosinophils Absolute 0.3  0.0 - 0.7 K/uL   Basophils Relative 1  0 - 1 %   Basophils Absolute 0.1  0.0 - 0.1 K/uL  BASIC METABOLIC PANEL      Result Value Range   Sodium 134 (*) 135 - 145 mEq/L   Potassium 3.8  3.5 - 5.1 mEq/L   Chloride 98  96 - 112 mEq/L   CO2 25  19 - 32 mEq/L   Glucose, Bld 63 (*) 70 - 99 mg/dL   BUN 50 (*) 6 - 23 mg/dL   Creatinine, Ser 4.09 (*) 0.50 - 1.10 mg/dL   Calcium 8.9  8.4 - 81.1 mg/dL   GFR calc non Af Amer 28 (*) >90 mL/min   GFR calc Af Amer 32 (*) >90 mL/min  URINALYSIS, ROUTINE W REFLEX MICROSCOPIC      Result Value Range   Color, Urine YELLOW  YELLOW   APPearance HAZY (*) CLEAR   Specific Gravity, Urine >1.030 (*) 1.005 - 1.030   pH 5.5  5.0 - 8.0   Glucose, UA NEGATIVE  NEGATIVE mg/dL   Hgb urine dipstick SMALL (*) NEGATIVE   Bilirubin Urine NEGATIVE  NEGATIVE   Ketones, ur NEGATIVE  NEGATIVE mg/dL   Protein, ur NEGATIVE  NEGATIVE mg/dL   Urobilinogen, UA 0.2  0.0 - 1.0 mg/dL   Nitrite NEGATIVE  NEGATIVE   Leukocytes, UA NEGATIVE  NEGATIVE  GLUCOSE, CAPILLARY      Result Value Range   Glucose-Capillary 93  70 - 99 mg/dL  URINE MICROSCOPIC-ADD ON      Result Value Range   Squamous Epithelial / LPF FEW (*) RARE   WBC, UA 7-10  <3 WBC/hpf   RBC / HPF 3-6  <3 RBC/hpf   Bacteria, UA MANY (*) RARE   Dg Chest 2 View  08/07/2012  *RADIOLOGY REPORT*  Clinical Data: Cough and congestion.  CHEST -  2 VIEW  Comparison:  Portable chest 06/05/2011.  Findings:  The patient is rotated to the right.  The heart is enlarged.  There is no edema or effusion to suggest failure. Degenerative changes are noted in the thoracolumbar spine.  IMPRESSION: Cardiomegaly without evidence for failure.   Original Report Authenticated By: Marin Roberts, M.D.        MDM  I personally performed the services described in this documentation, which was scribed in my presence. The recorded information has been reviewed and is accurate.  Meal.  Labs.   Date: 08/07/2012  Rate: 79  Rhythm: normal sinus rhythm  QRS Axis: normal  Intervals: normal  ST/T Wave abnormalities: nonspecific ST changes  Conduction Disutrbances:none  Narrative Interpretation:   Old EKG Reviewed: unchanged  On multiple recheck pt noted w low to borderline bp, w bp 92/72, 86/55. Ns bolus. Probable uti on labs, u cx sent. Rocephin iv. Will call med service  to admit.   Recheck bp improved post ivf 120/70.  Pt requests med for chronic back pain, morphine iv.     Given ?uti, marginal/low bp, recurrent hypoglycemia, will admit.     Suzi Roots, MD 08/07/12 2007  Suzi Roots, MD 08/07/12 2019

## 2012-08-07 NOTE — H&P (Signed)
[facility] History and Physical  Stephanie Combs ZOX:[id number] DOB: 1950-08-22 DOA: 08/07/2012  PCP: Toma Deiters, MD  Specialists: She is planning on seeing Dr. Gerilyn Pilgrim for chronic back pain  Chief Complaint: Low blood sugar and low blood pressure  HPI: Stephanie Combs is a 62 y.o. female with a past medical history of COPD, congestive heart failure, diabetes, hypertension, obesity, hypothyroidism, who was in her usual state of health till this morning when she was feeling weak. She tells me that she's had a cough for the last 3 weeks, which has been dry. She's had some fever and chills over the last week. Her husband has been sick as well. However, she did not seek attention for the same. She however, denies any change in her oral intake. And, then last night she started having some chest pain, which would get worse with deep breathing. Denies any syncopal episode. Had a few episodes of nausea and vomiting. She's also concerned about swelling in her legs for which she takes diuretics at home. However does not know, why it's not improving. The chest pain was a nonspecific pain, described variously as achy and sharp, located in the midline of the chest. The pain increases with cough and deep breathing. Does not radiate anywhere else. No precipitating or aggravating factors otherwise identified. She denies taking more medicine than she should. Denies any diarrhea recently.  Home Medications: Prior to Admission medications   Medication Sig Start Date End Date Taking? Authorizing Provider  alprazolam Prudy Feeler) 2 MG tablet Take 1-2 mg by mouth 4 (four) times daily as needed. Sometimes patient only takes 1mg , and she does not take 4 daily unless needed for anxiety   Yes Historical Provider, MD  aspirin 325 MG EC tablet Take 325 mg by mouth daily. Ecotrin OTC   Yes Historical Provider, MD  benazepril (LOTENSIN) 10 MG tablet Take 10 mg by mouth daily.   Yes Historical Provider, MD  diltiazem  (TIAZAC) 240 MG 24 hr capsule Take 240 mg by mouth daily.   Yes Historical Provider, MD  furosemide (LASIX) 20 MG tablet Take 40 mg by mouth at bedtime as needed. Only takes if leg swelling did not go down during day   Yes Historical Provider, MD  glyBURIDE-metformin (GLUCOVANCE) 5-500 MG per tablet Take 2 tablets by mouth 2 (two) times daily with a meal.   Yes Historical Provider, MD  HYDROcodone-acetaminophen (NORCO/VICODIN) 5-325 MG per tablet Take 1 tablet by mouth every 4 (four) hours as needed for pain. 02/25/12  Yes Tammy L. Triplett, PA-C  insulin aspart (NOVOLOG) 100 UNIT/ML injection Inject 20 Units into the skin daily. Takes back to back with Levemir   Yes Historical Provider, MD  insulin detemir (LEVEMIR) 100 UNIT/ML injection Inject 40 Units into the skin daily.    Yes Historical Provider, MD  levothyroxine (SYNTHROID, LEVOTHROID) 300 MCG tablet Take 300 mcg by mouth daily.   Yes Historical Provider, MD  torsemide (DEMADEX) 20 MG tablet Take 20 mg by mouth daily.    Yes Historical Provider, MD  ondansetron (ZOFRAN) 4 MG tablet Take 1 tablet (4 mg total) by mouth every 6 (six) hours. Prn nausea/vomiting 02/25/12   Tammy L. Triplett, PA-C    Allergies:  Allergies  Allergen Reactions  . Dilaudid (Hydromorphone Hcl)   . Oxycontin (Oxycodone)   . Reglan (Metoclopramide)   . Valium (Diazepam)   . Vistaril (Hydroxyzine Hcl)     Past Medical History: Past Medical History  Diagnosis Date  .  Thyroid disease   . Hypertension   . Diabetes mellitus without complication   . Fluid retention   . COPD (chronic obstructive pulmonary disease)   . CHF (congestive heart failure)   . Anxiety     Past Surgical History  Procedure Laterality Date  . Tubal ligation    . Hernia repair      Social History:  reports that she has been smoking Cigarettes.  She has been smoking about 0.50 packs per day. She does not have any smokeless tobacco history on file. She reports that she does not drink  alcohol or use illicit drugs.  Living Situation: She lives in [place] with her husband Activity Level: Usually requires a walker to ambulate outside her house   Family History: There is history of, diabetes, stroke, and heart disease in the family  Review of Systems - History obtained from the patient General ROS: positive for  - chills, fatigue and fever Psychological ROS: negative Ophthalmic ROS: negative ENT ROS: negative Allergy and Immunology ROS: negative Hematological and Lymphatic ROS: negative Endocrine ROS: negative Respiratory ROS: as in hpi Cardiovascular ROS: as in hpi Gastrointestinal ROS: no abdominal pain, change in bowel habits, or black or bloody stools Genito-Urinary ROS: Reports difficulty with urination today Musculoskeletal ROS: negative Neurological ROS: no TIA or stroke symptoms Dermatological ROS: negative  Physical Examination  Filed Vitals:   08/07/12 1823 08/07/12 2000 08/07/12 2027 08/07/12 2100  BP: 121/70 92/72 102/50 114/46  Pulse: 81 83 82 78  Temp:      TempSrc:      Resp: 22 24 18 20   Height:      Weight:      SpO2: 97% 98% 98% 97%   Temp: 97.5  General appearance: alert, cooperative, appears stated age, no distress and morbidly obese Head: Normocephalic, without obvious abnormality, atraumatic Eyes: conjunctivae/corneas clear. PERRL, EOM's intact.  Throat: lips, mucosa, and tongue normal; teeth and gums normal Resp: End expiratory wheezing bilaterally, with rhonchi. No definite crackles. Cardio: regular rate and rhythm, S1, S2 normal, no murmur, click, rub or gallop GI: Obese. soft, non-tender; bowel sounds normal; no masses,  no organomegaly Extremities: Swelling both legs noted, without any pitting edema. No calf tenderness. No erythema Pulses: 2+ and symmetric Skin: Skin color, texture, turgor normal. No rashes or lesions Lymph nodes: Cervical, supraclavicular, and axillary nodes normal. Neurologic: Alert and oriented X 3,  normal strength and tone. Normal symmetric reflexes.   Laboratory Data: Results for orders placed during the hospital encounter of 08/07/12 (from the past 48 hour(s))  CBC WITH DIFFERENTIAL     Status: Abnormal   Collection Time    08/07/12  3:22 PM      Result Value Range   WBC 15.9 (*) 4.0 - 10.5 K/uL   RBC 3.91  3.87 - 5.11 MIL/uL   Hemoglobin 11.0 (*) 12.0 - 15.0 g/dL   HCT 16.1 (*) 09.6 - 04.5 %   MCV 86.4  78.0 - 100.0 fL   MCH 28.1  26.0 - 34.0 pg   MCHC 32.5  30.0 - 36.0 g/dL   RDW 40.9  81.1 - 91.4 %   Platelets 329  150 - 400 K/uL   Neutrophils Relative 73  43 - 77 %   Neutro Abs 11.6 (*) 1.7 - 7.7 K/uL   Lymphocytes Relative 19  12 - 46 %   Lymphs Abs 3.1  0.7 - 4.0 K/uL   Monocytes Relative 6  3 - 12 %  Monocytes Absolute 0.9  0.1 - 1.0 K/uL   Eosinophils Relative 2  0 - 5 %   Eosinophils Absolute 0.3  0.0 - 0.7 K/uL   Basophils Relative 1  0 - 1 %   Basophils Absolute 0.1  0.0 - 0.1 K/uL  BASIC METABOLIC PANEL     Status: Abnormal   Collection Time    08/07/12  3:22 PM      Result Value Range   Sodium 134 (*) 135 - 145 mEq/L   Potassium 3.8  3.5 - 5.1 mEq/L   Chloride 98  96 - 112 mEq/L   CO2 25  19 - 32 mEq/L   Glucose, Bld 63 (*) 70 - 99 mg/dL   BUN 50 (*) 6 - 23 mg/dL   Creatinine, Ser 9.14 (*) 0.50 - 1.10 mg/dL   Calcium 8.9  8.4 - 78.2 mg/dL   GFR calc non Af Amer 28 (*) >90 mL/min   GFR calc Af Amer 32 (*) >90 mL/min   Comment:            The eGFR has been calculated     using the CKD EPI equation.     This calculation has not been     validated in all clinical     situations.     eGFR's persistently     <90 mL/min signify     possible Chronic Kidney Disease.  GLUCOSE, CAPILLARY     Status: None   Collection Time    08/07/12  6:15 PM      Result Value Range   Glucose-Capillary 93  70 - 99 mg/dL  URINALYSIS, ROUTINE W REFLEX MICROSCOPIC     Status: Abnormal   Collection Time    08/07/12  7:13 PM      Result Value Range   Color, Urine  YELLOW  YELLOW   APPearance HAZY (*) CLEAR   Specific Gravity, Urine >1.030 (*) 1.005 - 1.030   pH 5.5  5.0 - 8.0   Glucose, UA NEGATIVE  NEGATIVE mg/dL   Hgb urine dipstick SMALL (*) NEGATIVE   Bilirubin Urine NEGATIVE  NEGATIVE   Ketones, ur NEGATIVE  NEGATIVE mg/dL   Protein, ur NEGATIVE  NEGATIVE mg/dL   Urobilinogen, UA 0.2  0.0 - 1.0 mg/dL   Nitrite NEGATIVE  NEGATIVE   Leukocytes, UA NEGATIVE  NEGATIVE  URINE MICROSCOPIC-ADD ON     Status: Abnormal   Collection Time    08/07/12  7:13 PM      Result Value Range   Squamous Epithelial / LPF FEW (*) RARE   WBC, UA 7-10  <3 WBC/hpf   RBC / HPF 3-6  <3 RBC/hpf   Bacteria, UA MANY (*) RARE  LACTIC ACID, PLASMA     Status: None   Collection Time    08/07/12  8:13 PM      Result Value Range   Lactic Acid, Venous 2.0  0.5 - 2.2 mmol/L    Radiology Reports: Dg Chest 2 View  08/07/2012  *RADIOLOGY REPORT*  Clinical Data: Cough and congestion.  CHEST - 2 VIEW  Comparison:  Portable chest 06/05/2011.  Findings:  The patient is rotated to the right.  The heart is enlarged.  There is no edema or effusion to suggest failure. Degenerative changes are noted in the thoracolumbar spine.  IMPRESSION: Cardiomegaly without evidence for failure.   Original Report Authenticated By: Marin Roberts, M.D.     Electrocardiogram: Pending  Problem List  Active Problems:  UTI (lower urinary tract infection)   Hypoglycemia   Hypotension, unspecified   Bronchitis, acute   COPD (chronic obstructive pulmonary disease)   Morbid obesity   DM type 2 (diabetes mellitus, type 2)   Hypothyroidism (acquired)   ARF (acute renal failure)   Assessment: This is a 62 year old, Caucasian female, who is morbidly obese, who comes as she was noted to be hypoglycemic and hypotensive at home. Patient is found to have acute renal failure. She appears to have a urinary tract infection. Lactic acid is reassuringly normal. She also has a cough, which has been  ongoing for 3 weeks. However, chest x-ray is normal. Examination does reveal wheezing bilaterally. It appears, that patient may have gotten dehydrated due to her infection. She's also had a few episodes of emesis. Her chest pain, appears to be secondary to her bronchitis.   Plan: #1 hypoglycemia in the setting of known diabetes: Probably due to continued intake of her diabetes medications superimposed on the dehydration and acute renal failure. We'll check HbA1c. We'll monitor her CBGs closely. Hold all her insulin and oral agents.  #2 hypotension: Most likely due to hypovolemia. This is improved with IV fluids. We will monitor her in step down unit overnight. Hold her antihypertensive agents.  #3 acute bronchitis and chest pain: We will put her on antibiotics. We'll give her nebulizer treatments. We'll hold off on steroids for now. Oxygen as needed. EKG will be done and troponins will be checked. Chest appears to be pleuritic.  #4 acute renal failure: Give her IV fluids, and monitor renal function closely.  #5 urinary tract infection: Follow up with, urine cultures. Continue with ceftriaxone for now.  #6 history of hypothyroidism: Continue with Synthroid. Check TSH and free T4.  #7 leg swelling: Does not have any pitting edema per se. She reports a history of CHF. We don't have any echocardiogram data. Will order echocardiogram and get venous Dopplers.   DVT Prophylaxis: Heparin Code Status: She is a full code Family Communication: Discussed with the patient  Disposition Plan: Will order PT and OT   Further management decisions will depend on results of further testing and patient's response to treatment.  [facility]  [facility] Pager [phone]  If 7PM-7AM, please contact night-coverage [email or web address] Password [facility]  08/07/2012, 9:54 PM

## 2012-08-07 NOTE — ED Notes (Signed)
Report received from off going RN Dustin Folks.

## 2012-08-07 NOTE — ED Notes (Signed)
Pt family called EMS for hypoglycemia. Fire dpt checked it and was 47. Pt has since ate pbj sandwich. cbg in route 248. Pt alert/oriented. Pt now c/o nausea "i feel lifeless". Low back pain and "migraine". No s/s of pain or severe weakness noted. Onset sx's x 2 years ago.

## 2012-08-08 ENCOUNTER — Inpatient Hospital Stay (HOSPITAL_COMMUNITY): Payer: Medicare Other

## 2012-08-08 DIAGNOSIS — J209 Acute bronchitis, unspecified: Secondary | ICD-10-CM

## 2012-08-08 DIAGNOSIS — E119 Type 2 diabetes mellitus without complications: Secondary | ICD-10-CM

## 2012-08-08 LAB — COMPREHENSIVE METABOLIC PANEL
ALT: 6 U/L (ref 0–35)
AST: 9 U/L (ref 0–37)
Albumin: 2.5 g/dL — ABNORMAL LOW (ref 3.5–5.2)
Alkaline Phosphatase: 78 U/L (ref 39–117)
BUN: 45 mg/dL — ABNORMAL HIGH (ref 6–23)
CO2: 25 mEq/L (ref 19–32)
Calcium: 7.6 mg/dL — ABNORMAL LOW (ref 8.4–10.5)
Chloride: 104 mEq/L (ref 96–112)
Creatinine, Ser: 1.62 mg/dL — ABNORMAL HIGH (ref 0.50–1.10)
GFR calc Af Amer: 38 mL/min — ABNORMAL LOW (ref >90)
GFR calc non Af Amer: 33 mL/min — ABNORMAL LOW (ref >90)
Glucose, Bld: 154 mg/dL — ABNORMAL HIGH (ref 70–99)
Potassium: 4.1 mEq/L (ref 3.5–5.1)
Sodium: 135 mEq/L (ref 135–145)
Total Bilirubin: 0.2 mg/dL — ABNORMAL LOW (ref 0.3–1.2)
Total Protein: 5.1 g/dL — ABNORMAL LOW (ref 6.0–8.3)

## 2012-08-08 LAB — T4, FREE: Free T4: 1.78 ng/dL (ref 0.80–1.80)

## 2012-08-08 LAB — GLUCOSE, CAPILLARY
Glucose-Capillary: 135 mg/dL — ABNORMAL HIGH (ref 70–99)
Glucose-Capillary: 148 mg/dL — ABNORMAL HIGH (ref 70–99)
Glucose-Capillary: 152 mg/dL — ABNORMAL HIGH (ref 70–99)
Glucose-Capillary: 175 mg/dL — ABNORMAL HIGH (ref 70–99)
Glucose-Capillary: 185 mg/dL — ABNORMAL HIGH (ref 70–99)
Glucose-Capillary: 188 mg/dL — ABNORMAL HIGH (ref 70–99)
Glucose-Capillary: 190 mg/dL — ABNORMAL HIGH (ref 70–99)

## 2012-08-08 LAB — PROTIME-INR
INR: 0.96 (ref 0.00–1.49)
Prothrombin Time: 12.7 seconds (ref 11.6–15.2)

## 2012-08-08 LAB — CBC
HCT: 29.8 % — ABNORMAL LOW (ref 36.0–46.0)
Hemoglobin: 9.7 g/dL — ABNORMAL LOW (ref 12.0–15.0)
MCH: 28 pg (ref 26.0–34.0)
MCHC: 32.6 g/dL (ref 30.0–36.0)
MCV: 86.1 fL (ref 78.0–100.0)
Platelets: 255 10*3/uL (ref 150–400)
RBC: 3.46 MIL/uL — ABNORMAL LOW (ref 3.87–5.11)
RDW: 14.8 % (ref 11.5–15.5)
WBC: 10.3 10*3/uL (ref 4.0–10.5)

## 2012-08-08 LAB — TROPONIN I
Troponin I: <0.3 ng/mL (ref <0.30)
Troponin I: <0.3 ng/mL (ref <0.30)
Troponin I: <0.3 ng/mL (ref <0.30)

## 2012-08-08 LAB — HEMOGLOBIN A1C
Hgb A1c MFr Bld: 7.2 % — ABNORMAL HIGH (ref <5.7)
Mean Plasma Glucose: 160 mg/dL — ABNORMAL HIGH (ref <117)

## 2012-08-08 LAB — MRSA PCR SCREENING: MRSA by PCR: NEGATIVE

## 2012-08-08 LAB — TSH: TSH: 0.011 u[IU]/mL — ABNORMAL LOW (ref 0.350–4.500)

## 2012-08-08 MED ORDER — SODIUM CHLORIDE 0.9 % IV BOLUS (SEPSIS)
500.0000 mL | Freq: Once | INTRAVENOUS | Status: AC
  Administered 2012-08-08: 500 mL via INTRAVENOUS

## 2012-08-08 MED ORDER — INFLUENZA VIRUS VACC SPLIT PF IM SUSP
0.5000 mL | INTRAMUSCULAR | Status: DC
  Filled 2012-08-08: qty 0.5

## 2012-08-08 MED ORDER — INSULIN ASPART 100 UNIT/ML ~~LOC~~ SOLN
0.0000 [IU] | Freq: Three times a day (TID) | SUBCUTANEOUS | Status: DC
  Administered 2012-08-08: 1 [IU] via SUBCUTANEOUS
  Administered 2012-08-08 (×2): 2 [IU] via SUBCUTANEOUS
  Administered 2012-08-09: 3 [IU] via SUBCUTANEOUS
  Administered 2012-08-09 (×2): 1 [IU] via SUBCUTANEOUS

## 2012-08-08 MED ORDER — GABAPENTIN 300 MG PO CAPS
600.0000 mg | ORAL_CAPSULE | Freq: Three times a day (TID) | ORAL | Status: DC | PRN
  Administered 2012-08-08 – 2012-08-09 (×2): 600 mg via ORAL
  Filled 2012-08-08 (×2): qty 2

## 2012-08-08 MED ORDER — PNEUMOCOCCAL VAC POLYVALENT 25 MCG/0.5ML IJ INJ
0.5000 mL | INJECTION | INTRAMUSCULAR | Status: AC
  Administered 2012-08-08: 0.5 mL via INTRAMUSCULAR
  Filled 2012-08-08: qty 0.5

## 2012-08-08 MED ORDER — INSULIN ASPART 100 UNIT/ML ~~LOC~~ SOLN: 0.0000 [IU] | Freq: Every day | SUBCUTANEOUS | Status: DC

## 2012-08-08 MED ORDER — NYSTATIN 100000 UNIT/GM EX POWD
Freq: Two times a day (BID) | CUTANEOUS | Status: DC
  Administered 2012-08-08 – 2012-08-09 (×2): via TOPICAL
  Filled 2012-08-08: qty 15

## 2012-08-08 MED ORDER — PANTOPRAZOLE SODIUM 40 MG PO TBEC
40.0000 mg | DELAYED_RELEASE_TABLET | Freq: Every day | ORAL | Status: DC
  Administered 2012-08-08 – 2012-08-09 (×2): 40 mg via ORAL
  Filled 2012-08-08 (×2): qty 1

## 2012-08-08 NOTE — Plan of Care (Signed)
Problem: Consults Goal: General Medical Patient Education See Patient Education Module for specific education. Outcome: Progressing Admitted with hypotension, 2 liters of fluid given IV. Goal: Skin Care Protocol Initiated - if Braden Score 18 or less If consults are not indicated, leave blank or document N/A Outcome: Progressing Yeast under abdominal folds Goal: Nutrition Consult-if indicated Outcome: Progressing Patient states she has lost 130lbs since the loss of her mother, father and brother. Goal: Diabetes Guidelines if Diabetic/Glucose > 140 If diabetic or lab glucose is > 140 mg/dl - Initiate Diabetes/Hyperglycemia Guidelines & Document Interventions  Outcome: Not Progressing Patient stated she ate [employer] fast food earlier today.  Poor diet  Problem: Phase I Progression Outcomes Goal: Pain controlled with appropriate interventions Outcome: Progressing Vicodin 1 tab given for back, left neck, headache, chest pain Goal: OOB as tolerated unless otherwise ordered Outcome: Progressing Patient transferred from ED stretcher to bed with minimal standby assist.   Goal: Initial discharge plan identified Outcome: Progressing To home with spouse  Goal: Hemodynamically stable Outcome: Progressing hypotensive

## 2012-08-08 NOTE — Progress Notes (Signed)
eLink Physician-Brief Progress Note Patient Name: Stephanie Combs DOB: Sep 20, 1950 MRN: [id number]  Date of Service  08/08/2012   HPI/Events of Note  Patient admitted with UTI/AKI, currently with BP of 84/38 (50).  Had received 1 liter of NS in ED.  Current IVFs at 100 cc/hr.   eICU Interventions  Plan: 500 cc bolus of NS for hypotension. Continue to monitor BP   Intervention Category Intermediate Interventions: Hypotension - evaluation and management  DETERDING,ELIZABETH 08/08/2012, 1:06 AM

## 2012-08-08 NOTE — Progress Notes (Signed)
[facility] PROGRESS NOTE  Stephanie Combs ZOX:[id number] DOB: October 19, 1950 DOA: 08/07/2012 PCP: Toma Deiters, MD  Assessment/Plan: 1. Hypoglycemia, improved, likely due to oral hypoglycemics in the setting of renal failure. 2. Hypotension.  Due to hypovolemia.  Improving with IV fluids 3. Acute bronchitis and associated pleuritic chest pain.  Continue antibiotics and supportive treatments.  Cardiac markers are negative. 4. Acute renal failure.  Improving with IV fluids.  Monitor urine output 5. Hypothyroidism.  Follow up TSH 6. Leg swelling.  Follow up venous dopplers and echocardiogram 7. Possible UTI.  Follow up cultures.  On rocephin. 8. Hypoalbuminemia.  Likely due to poor nutrition.  Code Status: full code Family Communication: discussed with patient Disposition Plan: to be determined   Consultants:  none  Procedures:  none  Antibiotics:  Rocephin 3/30  Azithromycin 3/30  HPI/Subjective: Feeling better today, starting to make urine. Still coughing and wheezing.  Objective: Filed Vitals:   08/08/12 0630 08/08/12 0645 08/08/12 0713 08/08/12 0738  BP: 89/49 93/50    Pulse: 67 64    Temp:    97.9 F (36.6 C)  TempSrc:    Oral  Resp: 15 14    Height:      Weight:      SpO2: 99% 99% 98%     Intake/Output Summary (Last 24 hours) at 08/08/12 0925 Last data filed at 08/08/12 0600  Gross per 24 hour  Intake   1260 ml  Output    300 ml  Net    960 ml   Filed Weights   08/07/12 1524 08/07/12 2300  Weight: 103.42 kg (228 lb) 129.1 kg (284 lb 9.8 oz)    Exam:   General:  NAD  Cardiovascular: S1, S2 RRR  Respiratory: b/l exp wheezes and rhonchi  Abdomen: soft, nt, nd, bs+  Musculoskeletal: no pitting edema b/l   Data Reviewed: Basic Metabolic Panel:  Recent Labs Lab 08/07/12 1522 08/08/12 0456  NA 134* 135  K 3.8 4.1  CL 98 104  CO2 25 25  GLUCOSE 63* 154*  BUN 50* 45*  CREATININE 1.88* 1.62*  CALCIUM 8.9 7.6*   Liver Function  Tests:  Recent Labs Lab 08/08/12 0456  AST 9  ALT 6  ALKPHOS 78  BILITOT 0.2*  PROT 5.1*  ALBUMIN 2.5*   No results found for this basename: LIPASE, AMYLASE,  in the last 168 hours No results found for this basename: AMMONIA,  in the last 168 hours CBC:  Recent Labs Lab 08/07/12 1522 08/08/12 0456  WBC 15.9* 10.3  NEUTROABS 11.6*  --   HGB 11.0* 9.7*  HCT 33.8* 29.8*  MCV 86.4 86.1  PLT 329 255   Cardiac Enzymes:  Recent Labs Lab 08/07/12 2326 08/08/12 0456  TROPONINI <0.30 <0.30   BNP (last 3 results) No results found for this basename: PROBNP,  in the last 8760 hours CBG:  Recent Labs Lab 08/07/12 1815 08/08/12 0030 08/08/12 0354 08/08/12 0727  GLUCAP 93 185* 152* 135*    Recent Results (from the past 240 hour(s))  MRSA PCR SCREENING     Status: None   Collection Time    08/08/12  4:40 AM      Result Value Range Status   MRSA by PCR NEGATIVE  NEGATIVE Final   Comment:            The GeneXpert MRSA Assay (FDA     approved for NASAL specimens     only), is one component of a  comprehensive MRSA colonization     surveillance program. It is not     intended to diagnose MRSA     infection nor to guide or     monitor treatment for     MRSA infections.     Studies: Dg Chest 2 View  08/07/2012  *RADIOLOGY REPORT*  Clinical Data: Cough and congestion.  CHEST - 2 VIEW  Comparison:  Portable chest 06/05/2011.  Findings:  The patient is rotated to the right.  The heart is enlarged.  There is no edema or effusion to suggest failure. Degenerative changes are noted in the thoracolumbar spine.  IMPRESSION: Cardiomegaly without evidence for failure.   Original Report Authenticated By: Marin Roberts, M.D.     Scheduled Meds: . albuterol  2.5 mg Nebulization Q6H  . aspirin  325 mg Oral Daily  . azithromycin  500 mg Oral Daily  . cefTRIAXone (ROCEPHIN)  IV  1 g Intravenous Q24H  . guaiFENesin  600 mg Oral BID  . heparin  5,000 Units Subcutaneous Q8H   . insulin aspart  0-5 Units Subcutaneous QHS  . insulin aspart  0-9 Units Subcutaneous TID WC  . ipratropium  0.5 mg Nebulization Q6H  . levothyroxine  300 mcg Oral QAC breakfast  . nystatin   Topical BID  . pantoprazole  40 mg Oral Q1200  . pneumococcal 23 valent vaccine  0.5 mL Intramuscular Tomorrow-1000  . sodium chloride  3 mL Intravenous Q12H   Continuous Infusions: . sodium chloride 150 mL/hr at 08/08/12 0600    Active Problems:   UTI (lower urinary tract infection)   Hypoglycemia   Hypotension, unspecified   Bronchitis, acute   COPD (chronic obstructive pulmonary disease)   Morbid obesity   DM type 2 (diabetes mellitus, type 2)   Hypothyroidism (acquired)   ARF (acute renal failure)    Time spent: 25    [facility]  [facility] Pager [phone]. If 7PM-7AM, please contact night-coverage at [email or web address], password [facility] 08/08/2012, 9:25 AM  LOS: 1 day

## 2012-08-09 DIAGNOSIS — I059 Rheumatic mitral valve disease, unspecified: Secondary | ICD-10-CM

## 2012-08-09 DIAGNOSIS — R4182 Altered mental status, unspecified: Secondary | ICD-10-CM

## 2012-08-09 LAB — GLUCOSE, CAPILLARY
Glucose-Capillary: 147 mg/dL — ABNORMAL HIGH (ref 70–99)
Glucose-Capillary: 221 mg/dL — ABNORMAL HIGH (ref 70–99)
Glucose-Capillary: 136 mg/dL — ABNORMAL HIGH (ref 70–99)

## 2012-08-09 LAB — CBC
HCT: 30.1 % — ABNORMAL LOW (ref 36.0–46.0)
Hemoglobin: 9.7 g/dL — ABNORMAL LOW (ref 12.0–15.0)
MCH: 28 pg (ref 26.0–34.0)
MCHC: 32.2 g/dL (ref 30.0–36.0)
MCV: 86.7 fL (ref 78.0–100.0)
Platelets: 268 10*3/uL (ref 150–400)
RBC: 3.47 MIL/uL — ABNORMAL LOW (ref 3.87–5.11)
RDW: 14.7 % (ref 11.5–15.5)
WBC: 8 10*3/uL (ref 4.0–10.5)

## 2012-08-09 LAB — BASIC METABOLIC PANEL
BUN: 28 mg/dL — ABNORMAL HIGH (ref 6–23)
CO2: 26 mEq/L (ref 19–32)
Calcium: 8.2 mg/dL — ABNORMAL LOW (ref 8.4–10.5)
Chloride: 107 mEq/L (ref 96–112)
Creatinine, Ser: 1.05 mg/dL (ref 0.50–1.10)
GFR calc Af Amer: 65 mL/min — ABNORMAL LOW (ref >90)
GFR calc non Af Amer: 56 mL/min — ABNORMAL LOW (ref >90)
Glucose, Bld: 189 mg/dL — ABNORMAL HIGH (ref 70–99)
Potassium: 4.8 mEq/L (ref 3.5–5.1)
Sodium: 138 mEq/L (ref 135–145)

## 2012-08-09 LAB — URINE CULTURE
Colony Count: NO GROWTH
Culture: NO GROWTH

## 2012-08-09 MED ORDER — CEFUROXIME AXETIL 250 MG PO TABS
500.0000 mg | ORAL_TABLET | Freq: Two times a day (BID) | ORAL | Status: DC
  Administered 2012-08-09 (×2): 500 mg via ORAL
  Filled 2012-08-09 (×2): qty 2

## 2012-08-09 MED ORDER — CEFUROXIME AXETIL 500 MG PO TABS: 500.0000 mg | ORAL_TABLET | Freq: Two times a day (BID) | ORAL | Status: DC

## 2012-08-09 MED ORDER — POLYETHYLENE GLYCOL 3350 17 G PO PACK
17.0000 g | PACK | Freq: Every day | ORAL | Status: DC
  Administered 2012-08-09: 17 g via ORAL
  Filled 2012-08-09: qty 1

## 2012-08-09 MED ORDER — AZITHROMYCIN 250 MG PO TABS: 250.0000 mg | ORAL_TABLET | Freq: Every day | ORAL | Status: DC

## 2012-08-09 NOTE — Progress Notes (Signed)
UR Chart Review Completed  

## 2012-08-09 NOTE — Progress Notes (Signed)
[facility] PROGRESS NOTE  Stephanie Combs ZOX:[id number] DOB: 07-10-1950 DOA: 08/07/2012 PCP: Toma Deiters, MD  Assessment/Plan: 1. Hypoglycemia, improved, likely due to oral hypoglycemics in the setting of renal failure. 2. Hypotension. Due to hypovolemia. Improving with IV fluids 3. Acute bronchitis and associated pleuritic chest pain. Continue antibiotics and supportive treatments. Cardiac markers are negative. 4. Acute renal failure. Improving with IV fluids. Monitor urine output 5. Hypothyroidism. TSH is low,  But free T4 is normal. this will need to be followed up by PCP. 6. Leg swelling. Venous dopplers negative. Echocardiogram pending. Will prescribe TED hose. 7. Possible UTI. Follow up cultures. On rocephin. Will empirically change to ceftin. 8. Hypoalbuminemia. Likely due to poor nutrition.   Code Status: full code  Family Communication: discussed with patient  Disposition Plan: probable discharge home today after echo.  Patient has declined any home health services including PT or RN  Consultants:  none Procedures:  none Antibiotics:  Rocephin 3/30  Azithromycin 3/30   HPI/Subjective: Feeling better today.  Wants to go home today.  Objective: Filed Vitals:   08/09/12 0600 08/09/12 0700 08/09/12 0748 08/09/12 0800  BP: 109/56 113/63  111/79  Pulse: 74 71  76  Temp:      TempSrc:      Resp: 17 18  16   Height:      Weight:      SpO2: 98% 97% 97% 94%    Intake/Output Summary (Last 24 hours) at 08/09/12 0929 Last data filed at 08/09/12 0800  Gross per 24 hour  Intake 1922.5 ml  Output   2100 ml  Net -177.5 ml   Filed Weights   08/07/12 1524 08/07/12 2300 08/09/12 0500  Weight: 103.42 kg (228 lb) 129.1 kg (284 lb 9.8 oz) 133.6 kg (294 lb 8.6 oz)    Exam:   General:  NAD  Cardiovascular: S1, S2 RRR  Respiratory: CTA B  Abdomen: S1, S2 RRR  Musculoskeletal: trace pitting edema b/l   Data Reviewed: Basic Metabolic Panel:  Recent  Labs Lab 08/07/12 1522 08/08/12 0456 08/09/12 0439  NA 134* 135 138  K 3.8 4.1 4.8  CL 98 104 107  CO2 25 25 26   GLUCOSE 63* 154* 189*  BUN 50* 45* 28*  CREATININE 1.88* 1.62* 1.05  CALCIUM 8.9 7.6* 8.2*   Liver Function Tests:  Recent Labs Lab 08/08/12 0456  AST 9  ALT 6  ALKPHOS 78  BILITOT 0.2*  PROT 5.1*  ALBUMIN 2.5*   No results found for this basename: LIPASE, AMYLASE,  in the last 168 hours No results found for this basename: AMMONIA,  in the last 168 hours CBC:  Recent Labs Lab 08/07/12 1522 08/08/12 0456 08/09/12 0439  WBC 15.9* 10.3 8.0  NEUTROABS 11.6*  --   --   HGB 11.0* 9.7* 9.7*  HCT 33.8* 29.8* 30.1*  MCV 86.4 86.1 86.7  PLT 329 255 268   Cardiac Enzymes:  Recent Labs Lab 08/07/12 2326 08/08/12 0456 08/08/12 1103  TROPONINI <0.30 <0.30 <0.30   BNP (last 3 results) No results found for this basename: PROBNP,  in the last 8760 hours CBG:  Recent Labs Lab 08/08/12 1141 08/08/12 1644 08/08/12 2015 08/08/12 2140 08/09/12 0711  GLUCAP 188* 190* 175* 148* 147*    Recent Results (from the past 240 hour(s))  MRSA PCR SCREENING     Status: None   Collection Time    08/08/12  4:40 AM      Result Value Range Status  MRSA by PCR NEGATIVE  NEGATIVE Final   Comment:            The GeneXpert MRSA Assay (FDA     approved for NASAL specimens     only), is one component of a     comprehensive MRSA colonization     surveillance program. It is not     intended to diagnose MRSA     infection nor to guide or     monitor treatment for     MRSA infections.     Studies: Dg Chest 2 View  08/07/2012  *RADIOLOGY REPORT*  Clinical Data: Cough and congestion.  CHEST - 2 VIEW  Comparison:  Portable chest 06/05/2011.  Findings:  The patient is rotated to the right.  The heart is enlarged.  There is no edema or effusion to suggest failure. Degenerative changes are noted in the thoracolumbar spine.  IMPRESSION: Cardiomegaly without evidence for  failure.   Original Report Authenticated By: Marin Roberts, M.D.    US Venous Img Lower Bilateral  08/08/2012  *RADIOLOGY REPORT*  Clinical Data: Leg pain and swelling  BILATERAL LOWER EXTREMITY VENOUS DUPLEX ULTRASOUND  Technique:  Gray-scale sonography with graded compression, as well as color Doppler and duplex ultrasound, were performed to evaluate the deep venous system of both lower extremities from the level of the common femoral vein through the popliteal and proximal calf veins.  Spectral Doppler was utilized to evaluate flow at rest and with distal augmentation maneuvers.  Comparison:  None.  Findings:  Normal compressibility of bilateral common femoral, superficial femoral, and popliteal veins is demonstrated, as well as the visualized proximal calf veins.  No filling defects to suggest DVT on grayscale or color Doppler imaging.  Doppler waveforms show normal direction of venous flow, normal respiratory phasicity and response to augmentation.  IMPRESSION: No evidence of deep vein thrombosis in either lower extremity.   Original Report Authenticated By: Judie Petit. Shick, M.D.     Scheduled Meds: . albuterol  2.5 mg Nebulization Q6H  . aspirin  325 mg Oral Daily  . azithromycin  500 mg Oral Daily  . cefUROXime  500 mg Oral BID WC  . guaiFENesin  600 mg Oral BID  . heparin  5,000 Units Subcutaneous Q8H  . insulin aspart  0-5 Units Subcutaneous QHS  . insulin aspart  0-9 Units Subcutaneous TID WC  . ipratropium  0.5 mg Nebulization Q6H  . levothyroxine  300 mcg Oral QAC breakfast  . nystatin   Topical BID  . pantoprazole  40 mg Oral Q1200  . sodium chloride  3 mL Intravenous Q12H   Continuous Infusions:   Active Problems:   UTI (lower urinary tract infection)   Hypoglycemia   Hypotension, unspecified   Bronchitis, acute   COPD (chronic obstructive pulmonary disease)   Morbid obesity   DM type 2 (diabetes mellitus, type 2)   Hypothyroidism (acquired)   ARF (acute renal  failure)    Time spent:    Parlee Amescua  [facility] Pager [phone]. If 7PM-7AM, please contact night-coverage at [email or web address], password [facility] 08/09/2012, 9:29 AM  LOS: 2 days

## 2012-08-09 NOTE — Evaluation (Signed)
Physical Therapy Evaluation Patient Details Name: Stephanie Combs MRN: [id number] DOB: Oct 24, 1950 Today's Date: 08/09/2012 Time: 4098-1191 PT Time Calculation (min): 30 min  PT Assessment / Plan / Recommendation Clinical Impression  Pt is a 62 year old female admitted to [facility] for UTI and referred to PT for mobility.  At this time pt reports she feels she is at [facility] and tolerates activity well.  At this time pt does not require f/u PT.     PT Assessment       Follow Up Recommendations  No PT follow up    Does the patient have the potential to tolerate intense rehabilitation      Barriers to Discharge        Equipment Recommendations  None recommended by PT    Recommendations for Other Services     Frequency      Precautions / Restrictions Precautions Precautions: None Restrictions Weight Bearing Restrictions: No   Pertinent Vitals/Pain Denies pain      Mobility  Bed Mobility Bed Mobility: Supine to Sit Supine to Sit: 6: Modified independent (Device/Increase time) Transfers Transfers: Sit to Stand;Stand to Sit Sit to Stand: 5: Supervision;From bed;From toilet Stand to Sit: 5: Supervision;To toilet;To bed Ambulation/Gait Ambulation/Gait Assistance: 5: Supervision Assistive device: Rolling walker    Exercises     PT Diagnosis:    PT Problem List:   PT Treatment Interventions:     PT Goals    Visit Information  Last PT Received On: 08/09/12    Subjective Data  Subjective: I need to go to the bathroom...my legs have really been swollen Patient Stated Goal: I am planning to go home tonight.    Prior Functioning  Home Living Lives With: Spouse Available Help at Discharge: Family Type of Home: House Home Access: Ramped entrance Home Layout: One level Bathroom Shower/Tub: Teacher, adult education: Shower chair with back;Walker - rolling;Shower chair without back;Straight cane Prior Function Level of Independence: Independent with  assistive device(s) Able to Take Stairs?: No Driving: Yes Comments: takes care of her home. Communication Communication: No difficulties Dominant Hand: Right    Cognition       Extremity/Trunk Assessment     Balance    End of Session PT - End of Session Equipment Utilized During Treatment: Gait belt Activity Tolerance: Patient tolerated treatment well Patient left: in chair;with call bell/phone within reach;with nursing in room Nurse Communication: Mobility status  GP     Larri Yehle, PT 08/09/2012, 10:22 AM

## 2012-08-09 NOTE — Progress Notes (Signed)
*  PRELIMINARY RESULTS* Echocardiogram 2D Echocardiogram has been performed.  Stephanie Combs 08/09/2012, 11:43 AM

## 2012-08-09 NOTE — Progress Notes (Signed)
Inpatient Diabetes Program Recommendations  AACE/ADA: New Consensus Statement on Inpatient Glycemic Control (2013)  Target Ranges:  Prepandial:   less than 140 mg/dL      Peak postprandial:   less than 180 mg/dL (1-2 hours)      Critically ill patients:  140 - 180 mg/dL   Results for KELEE, CUNNINGHAM (MRN [id number]) as of 08/09/2012 12:48  Ref. Range 08/08/2012 16:44 08/08/2012 20:15 08/08/2012 21:40 08/09/2012 07:11 08/09/2012 11:40  Glucose-Capillary Latest Range: 70-99 mg/dL 213 (H) 086 (H) 578 (H) 147 (H) 221 (H)    Inpatient Diabetes Program Recommendations Insulin - Basal: If patient is not discharged today as planned, please order basal insulin (recommend lower dose than used at home and titrate accordingly).  Note: Patient has a history of diabetes and takes Glyburide-Metformin 5-500 mg BID and Levemir 40 units daily at home for diabetes management.  Currently, patient is ordered to receive Novolog sensitive correction AC along with bedtime Novolog correction for inpatient glycemic control.  Patient was initially noted to be hypoglycemic on arrival to the hospital and hypoglycemia appears to be resolved at this time.  Noted that patient may discharge home today.  If patient is not discharged home today as planned, please consider ordering basal insulin (would recommend lower dose than used at home and titrate accordingly).  Will continue to follow as an inpatient.  Thanks, Orlando Penner, RN, BSN, CCRN Diabetes Coordinator Inpatient Diabetes Program [phone]

## 2012-08-09 NOTE — Progress Notes (Signed)
DISCHARGE INSTRUCTIONS GIVEN. LT FOREARM NSL D/C'D. VSS AND CBG'S STABLE.PT ALERT AND ORIENTED. ABLE TO AMBULATE IN ROOM PT GIVEN RX'S. DISCHARGED HOME VIA W/C ACCOMPAINED MY DAUGHTER AND SON.

## 2012-08-09 NOTE — Discharge Summary (Signed)
Physician Discharge Summary  DEDEE Combs RUE:[id number] DOB: 1950/08/09 DOA: 08/07/2012  PCP: Toma Deiters, MD  Admit date: 08/07/2012 Discharge date: 08/09/2012  Time spent: 40 minutes  Recommendations for Outpatient Follow-up:  1. Followup with Dr. Gerilyn Pilgrim tomorrow as previously scheduled 2. Followup with primary care physician in 2 weeks. 3. Patient's antihypertensives are currently on hold and can be resumed as her blood pressure allows.  Discharge Diagnoses:  Active Problems:   UTI (lower urinary tract infection)   Hypoglycemia   Hypotension, unspecified   Bronchitis, acute   COPD (chronic obstructive pulmonary disease)   Morbid obesity   DM type 2 (diabetes mellitus, type 2)   Hypothyroidism (acquired)   ARF (acute renal failure)   Discharge Condition: improved  Diet recommendation: low salt, low carb  Filed Weights   08/07/12 1524 08/07/12 2300 08/09/12 0500  Weight: 103.42 kg (228 lb) 129.1 kg (284 lb 9.8 oz) 133.6 kg (294 lb 8.6 oz)    History of present illness:  Stephanie Combs is a 62 y.o. female with a past medical history of COPD, congestive heart failure, diabetes, hypertension, obesity, hypothyroidism, who was in her usual state of health till this morning when she was feeling weak. She tells me that she's had a cough for the last 3 weeks, which has been dry. She's had some fever and chills over the last week. Her husband has been sick as well. However, she did not seek attention for the same. She however, denies any change in her oral intake. And, then last night she started having some chest pain, which would get worse with deep breathing. Denies any syncopal episode. Had a few episodes of nausea and vomiting. She's also concerned about swelling in her legs for which she takes diuretics at home. However does not know, why it's not improving. The chest pain was a nonspecific pain, described variously as achy and sharp, located in the midline of the chest. The  pain increases with cough and deep breathing. Does not radiate anywhere else. No precipitating or aggravating factors otherwise identified. She denies taking more medicine than she should. Denies any diarrhea recently.   Hospital Course:  This patient was initially admitted to the hospital blood sugar and low blood pressure. She was found to be hypoglycemic and hypotensive in the emergency room. This was likely secondary to dehydration. Patient was recently diagnosed with acute bronchitis and reports that her by mouth intake was poor for the past few days. She was sent to be acute renal failure. Of note she was taking diuretics prior to admission. Patient's antihypertensives as well as diuretics were held and she was rehydrated with IV fluids. Since that her renal failure has resolved and creatinine is back to normal range. We will restart her diuretics at this point. Her hypotension is also improved. We have not restarted her blood pressure medications since she is normotensive. These can certainly be restarted as an outpatient as her blood pressure allows.  Her hypoglycemia also resolved after admission. She was taking oral hypoglycemics which likely had a lumbar effect in the setting of acute renal failure. Since her renal failure is resolved, we will resume her outpatient therapy.  The patient's respiratory status has also improved. Her wheezing has resolved. She is continued on bronchodilators and antibiotics. She reports using oxygen at home intermittently. Currently she is breathing comfortably on 2 L and will be discharged home on the same.  Patient was found that h normal T4. This will need to  be further repeated and reevaluated the outpatient setting.  Patient is clinically improved. She requests discharge home today.  Procedures: 2D Echo: Left ventricle: The cavity size was normal. There was moderate focal basal hypertrophy. Systolic function was vigorous. The estimated ejection fraction  was in the range of 65% to 70%. Although no diagnostic regional wall motion abnormality was identified, this possibility cannot be completely excluded on the basis of this study. Features are consistent with a pseudonormal left ventricular filling pattern, with concomitant abnormal relaxation and increased filling pressure (grade 2 diastolic dysfunction). Doppler parameters are consistent with elevated ventricular end-diastolic filling pressure. No previous study for comparison. - Aortic valve: Poorly visualized. Mildly calcified annulus. Mildly calcified leaflets. Transvalvular velocity was within the normal range. - Mitral valve: Calcified annulus. Mild regurgitation. - Left atrium: The atrium was mildly to moderately dilated. - Right ventricle: The cavity size was mildly dilated. Wall thickness was mildly increased. Systolic function was mildly reduced. - Right atrium: Central venous pressure: 15mm Hg (est). - Tricuspid valve: Mild regurgitation. - Pulmonary arteries: Systolic pressure was at least moderately increased. PA peak pressure: 60mm Hg (S). - Pericardium, extracardiac: There was no pericardial effusion.     Consultations:  none  Discharge Exam: Filed Vitals:   08/09/12 1428 08/09/12 1500 08/09/12 1529 08/09/12 1600  BP:      Pulse:      Temp:   98.1 F (36.7 C)   TempSrc:      Resp:  17  16  Height:      Weight:      SpO2: 96%       General: No acute distress Cardiovascular: S1, S2, regular rate and rhythm Respiratory: Clear to auscultation bilaterally, wheezing has resolved  Discharge Instructions  Discharge Orders   Future Orders Complete By Expires     Call MD for:  difficulty breathing, headache or visual disturbances  As directed     Call MD for:  temperature >100.4  As directed     Diet - low sodium heart healthy  As directed     Increase activity slowly  As directed         Medication List    STOP taking these medications        benazepril 10 MG tablet  Commonly known as:  LOTENSIN     diltiazem 240 MG 24 hr capsule  Commonly known as:  TIAZAC     furosemide 20 MG tablet  Commonly known as:  LASIX      TAKE these medications       alprazolam 2 MG tablet  Commonly known as:  XANAX  Take 1-2 mg by mouth 4 (four) times daily as needed. Sometimes patient only takes 1mg , and she does not take 4 daily unless needed for anxiety     aspirin 325 MG EC tablet  Take 325 mg by mouth daily. Ecotrin OTC     azithromycin 250 MG tablet  Commonly known as:  ZITHROMAX  Take 1 tablet (250 mg total) by mouth daily.     cefUROXime 500 MG tablet  Commonly known as:  CEFTIN  Take 1 tablet (500 mg total) by mouth 2 (two) times daily with a meal.     glyBURIDE-metformin 5-500 MG per tablet  Commonly known as:  GLUCOVANCE  Take 2 tablets by mouth 2 (two) times daily with a meal.     HYDROcodone-acetaminophen 5-325 MG per tablet  Commonly known as:  NORCO/VICODIN  Take 1 tablet by mouth  every 4 (four) hours as needed for pain.     insulin aspart 100 UNIT/ML injection  Commonly known as:  novoLOG  Inject 20 Units into the skin daily. Takes back to back with Levemir     insulin detemir 100 UNIT/ML injection  Commonly known as:  LEVEMIR  Inject 40 Units into the skin daily.     levothyroxine 300 MCG tablet  Commonly known as:  SYNTHROID, LEVOTHROID  Take 300 mcg by mouth daily.     ondansetron 4 MG tablet  Commonly known as:  ZOFRAN  Take 1 tablet (4 mg total) by mouth every 6 (six) hours. Prn nausea/vomiting     torsemide 20 MG tablet  Commonly known as:  DEMADEX  Take 20 mg by mouth daily.           Follow-up Information   Follow up with HASANAJ,XAJE A, MD. Schedule an appointment as soon as possible for a visit in 2 weeks.      Follow up with Beryle Beams, MD. (tomorrow as scheduled)    Contact information:   [address] [place] [address] [phone]        The results of  significant diagnostics from this hospitalization (including imaging, microbiology, ancillary and laboratory) are listed below for reference.    Significant Diagnostic Studies: Dg Chest 2 View  08/07/2012  *RADIOLOGY REPORT*  Clinical Data: Cough and congestion.  CHEST - 2 VIEW  Comparison:  Portable chest 06/05/2011.  Findings:  The patient is rotated to the right.  The heart is enlarged.  There is no edema or effusion to suggest failure. Degenerative changes are noted in the thoracolumbar spine.  IMPRESSION: Cardiomegaly without evidence for failure.   Original Report Authenticated By: Marin Roberts, M.D.    US Venous Img Lower Bilateral  08/08/2012  *RADIOLOGY REPORT*  Clinical Data: Leg pain and swelling  BILATERAL LOWER EXTREMITY VENOUS DUPLEX ULTRASOUND  Technique:  Gray-scale sonography with graded compression, as well as color Doppler and duplex ultrasound, were performed to evaluate the deep venous system of both lower extremities from the level of the common femoral vein through the popliteal and proximal calf veins.  Spectral Doppler was utilized to evaluate flow at rest and with distal augmentation maneuvers.  Comparison:  None.  Findings:  Normal compressibility of bilateral common femoral, superficial femoral, and popliteal veins is demonstrated, as well as the visualized proximal calf veins.  No filling defects to suggest DVT on grayscale or color Doppler imaging.  Doppler waveforms show normal direction of venous flow, normal respiratory phasicity and response to augmentation.  IMPRESSION: No evidence of deep vein thrombosis in either lower extremity.   Original Report Authenticated By: Judie Petit. Miles Costain, M.D.     Microbiology: Recent Results (from the past 240 hour(s))  MRSA PCR SCREENING     Status: None   Collection Time    08/08/12  4:40 AM      Result Value Range Status   MRSA by PCR NEGATIVE  NEGATIVE Final   Comment:            The GeneXpert MRSA Assay (FDA     approved for  NASAL specimens     only), is one component of a     comprehensive MRSA colonization     surveillance program. It is not     intended to diagnose MRSA     infection nor to guide or     monitor treatment for     MRSA infections.  Labs: Basic Metabolic Panel:  Recent Labs Lab 08/07/12 1522 08/08/12 0456 08/09/12 0439  NA 134* 135 138  K 3.8 4.1 4.8  CL 98 104 107  CO2 25 25 26   GLUCOSE 63* 154* 189*  BUN 50* 45* 28*  CREATININE 1.88* 1.62* 1.05  CALCIUM 8.9 7.6* 8.2*   Liver Function Tests:  Recent Labs Lab 08/08/12 0456  AST 9  ALT 6  ALKPHOS 78  BILITOT 0.2*  PROT 5.1*  ALBUMIN 2.5*   No results found for this basename: LIPASE, AMYLASE,  in the last 168 hours No results found for this basename: AMMONIA,  in the last 168 hours CBC:  Recent Labs Lab 08/07/12 1522 08/08/12 0456 08/09/12 0439  WBC 15.9* 10.3 8.0  NEUTROABS 11.6*  --   --   HGB 11.0* 9.7* 9.7*  HCT 33.8* 29.8* 30.1*  MCV 86.4 86.1 86.7  PLT 329 255 268   Cardiac Enzymes:  Recent Labs Lab 08/07/12 2326 08/08/12 0456 08/08/12 1103  TROPONINI <0.30 <0.30 <0.30   BNP: BNP (last 3 results) No results found for this basename: PROBNP,  in the last 8760 hours CBG:  Recent Labs Lab 08/08/12 2015 08/08/12 2140 08/09/12 0711 08/09/12 1140 08/09/12 1714  GLUCAP 175* 148* 147* 221* 136*       Signed:  Kynesha Guerin  [facility] 08/09/2012, 6:44 PM

## 2012-10-19 ENCOUNTER — Other Ambulatory Visit: Payer: Self-pay | Admitting: Adult Health

## 2012-11-08 ENCOUNTER — Telehealth: Payer: Self-pay | Admitting: *Deleted

## 2012-11-08 NOTE — Telephone Encounter (Signed)
ERROR

## 2013-04-14 ENCOUNTER — Encounter (HOSPITAL_COMMUNITY): Payer: Self-pay | Admitting: Emergency Medicine

## 2013-04-14 ENCOUNTER — Emergency Department (HOSPITAL_COMMUNITY): Payer: Medicare Other

## 2013-04-14 ENCOUNTER — Inpatient Hospital Stay (HOSPITAL_COMMUNITY)
Admission: EM | Admit: 2013-04-14 | Discharge: 2013-04-21 | DRG: 291 | Disposition: A | Discharge status: Home Health | Payer: Medicare Other | Attending: Internal Medicine | Admitting: Internal Medicine

## 2013-04-14 DIAGNOSIS — J9620 Acute and chronic respiratory failure, unspecified whether with hypoxia or hypercapnia: Secondary | ICD-10-CM | POA: Diagnosis present

## 2013-04-14 DIAGNOSIS — E871 Hypo-osmolality and hyponatremia: Secondary | ICD-10-CM | POA: Diagnosis present

## 2013-04-14 DIAGNOSIS — I959 Hypotension, unspecified: Secondary | ICD-10-CM

## 2013-04-14 DIAGNOSIS — K5900 Constipation, unspecified: Secondary | ICD-10-CM | POA: Diagnosis present

## 2013-04-14 DIAGNOSIS — Z9981 Dependence on supplemental oxygen: Secondary | ICD-10-CM

## 2013-04-14 DIAGNOSIS — J441 Chronic obstructive pulmonary disease with (acute) exacerbation: Secondary | ICD-10-CM | POA: Diagnosis present

## 2013-04-14 DIAGNOSIS — E039 Hypothyroidism, unspecified: Secondary | ICD-10-CM | POA: Insufficient documentation

## 2013-04-14 DIAGNOSIS — D631 Anemia in chronic kidney disease: Secondary | ICD-10-CM | POA: Diagnosis present

## 2013-04-14 DIAGNOSIS — F17200 Nicotine dependence, unspecified, uncomplicated: Secondary | ICD-10-CM | POA: Diagnosis present

## 2013-04-14 DIAGNOSIS — M549 Dorsalgia, unspecified: Secondary | ICD-10-CM | POA: Diagnosis present

## 2013-04-14 DIAGNOSIS — G8929 Other chronic pain: Secondary | ICD-10-CM | POA: Diagnosis present

## 2013-04-14 DIAGNOSIS — I5033 Acute on chronic diastolic (congestive) heart failure: Principal | ICD-10-CM | POA: Diagnosis present

## 2013-04-14 DIAGNOSIS — D72829 Elevated white blood cell count, unspecified: Secondary | ICD-10-CM | POA: Diagnosis present

## 2013-04-14 DIAGNOSIS — D509 Iron deficiency anemia, unspecified: Secondary | ICD-10-CM | POA: Diagnosis present

## 2013-04-14 DIAGNOSIS — Z6841 Body Mass Index (BMI) 40.0 and over, adult: Secondary | ICD-10-CM

## 2013-04-14 DIAGNOSIS — D649 Anemia, unspecified: Secondary | ICD-10-CM

## 2013-04-14 DIAGNOSIS — N183 Chronic kidney disease, stage 3 unspecified: Secondary | ICD-10-CM | POA: Diagnosis present

## 2013-04-14 DIAGNOSIS — Z79899 Other long term (current) drug therapy: Secondary | ICD-10-CM

## 2013-04-14 DIAGNOSIS — F411 Generalized anxiety disorder: Secondary | ICD-10-CM | POA: Diagnosis present

## 2013-04-14 DIAGNOSIS — Z7982 Long term (current) use of aspirin: Secondary | ICD-10-CM

## 2013-04-14 DIAGNOSIS — I5032 Chronic diastolic (congestive) heart failure: Secondary | ICD-10-CM | POA: Insufficient documentation

## 2013-04-14 DIAGNOSIS — D638 Anemia in other chronic diseases classified elsewhere: Secondary | ICD-10-CM | POA: Diagnosis present

## 2013-04-14 DIAGNOSIS — J209 Acute bronchitis, unspecified: Secondary | ICD-10-CM

## 2013-04-14 DIAGNOSIS — I129 Hypertensive chronic kidney disease with stage 1 through stage 4 chronic kidney disease, or unspecified chronic kidney disease: Secondary | ICD-10-CM | POA: Diagnosis present

## 2013-04-14 DIAGNOSIS — N179 Acute kidney failure, unspecified: Secondary | ICD-10-CM

## 2013-04-14 DIAGNOSIS — E162 Hypoglycemia, unspecified: Secondary | ICD-10-CM

## 2013-04-14 DIAGNOSIS — Z794 Long term (current) use of insulin: Secondary | ICD-10-CM

## 2013-04-14 DIAGNOSIS — J449 Chronic obstructive pulmonary disease, unspecified: Secondary | ICD-10-CM | POA: Diagnosis present

## 2013-04-14 DIAGNOSIS — I10 Essential (primary) hypertension: Secondary | ICD-10-CM | POA: Diagnosis present

## 2013-04-14 DIAGNOSIS — N390 Urinary tract infection, site not specified: Secondary | ICD-10-CM

## 2013-04-14 DIAGNOSIS — R079 Chest pain, unspecified: Secondary | ICD-10-CM | POA: Diagnosis present

## 2013-04-14 DIAGNOSIS — E119 Type 2 diabetes mellitus without complications: Secondary | ICD-10-CM | POA: Diagnosis present

## 2013-04-14 DIAGNOSIS — N289 Disorder of kidney and ureter, unspecified: Secondary | ICD-10-CM

## 2013-04-14 DIAGNOSIS — I509 Heart failure, unspecified: Secondary | ICD-10-CM | POA: Diagnosis present

## 2013-04-14 DIAGNOSIS — T380X5A Adverse effect of glucocorticoids and synthetic analogues, initial encounter: Secondary | ICD-10-CM | POA: Diagnosis present

## 2013-04-14 DIAGNOSIS — R0600 Dyspnea, unspecified: Secondary | ICD-10-CM | POA: Diagnosis present

## 2013-04-14 HISTORY — DX: Hypothyroidism, unspecified: E03.9

## 2013-04-14 HISTORY — DX: Other chronic pain: G89.29

## 2013-04-14 HISTORY — DX: Dorsalgia, unspecified: M54.9

## 2013-04-14 HISTORY — DX: Dependence on supplemental oxygen: Z99.81

## 2013-04-14 HISTORY — DX: Localized edema: R60.0

## 2013-04-14 LAB — URINALYSIS W MICROSCOPIC + REFLEX CULTURE
Bilirubin Urine: NEGATIVE
Glucose, UA: NEGATIVE mg/dL
Ketones, ur: NEGATIVE mg/dL
Leukocytes, UA: NEGATIVE
Nitrite: NEGATIVE
Protein, ur: NEGATIVE mg/dL
Specific Gravity, Urine: 1.025 (ref 1.005–1.030)
Urobilinogen, UA: 0.2 mg/dL (ref 0.0–1.0)
pH: 5.5 (ref 5.0–8.0)

## 2013-04-14 LAB — CBC WITH DIFFERENTIAL/PLATELET
Basophils Absolute: 0.1 10*3/uL (ref 0.0–0.1)
Basophils Relative: 0 % (ref 0–1)
Eosinophils Absolute: 0 10*3/uL (ref 0.0–0.7)
Eosinophils Relative: 0 % (ref 0–5)
HCT: 36.1 % (ref 36.0–46.0)
Hemoglobin: 11.6 g/dL — ABNORMAL LOW (ref 12.0–15.0)
Lymphocytes Relative: 17 % (ref 12–46)
Lymphs Abs: 1.9 10*3/uL (ref 0.7–4.0)
MCH: 28.3 pg (ref 26.0–34.0)
MCHC: 32.1 g/dL (ref 30.0–36.0)
MCV: 88 fL (ref 78.0–100.0)
Monocytes Absolute: 0.9 10*3/uL (ref 0.1–1.0)
Monocytes Relative: 8 % (ref 3–12)
Neutro Abs: 8.7 10*3/uL — ABNORMAL HIGH (ref 1.7–7.7)
Neutrophils Relative %: 75 % (ref 43–77)
Platelets: 251 10*3/uL (ref 150–400)
RBC: 4.1 MIL/uL (ref 3.87–5.11)
RDW: 15 % (ref 11.5–15.5)
WBC: 11.6 10*3/uL — ABNORMAL HIGH (ref 4.0–10.5)

## 2013-04-14 LAB — CBC
HCT: 34.4 % — ABNORMAL LOW (ref 36.0–46.0)
Hemoglobin: 10.8 g/dL — ABNORMAL LOW (ref 12.0–15.0)
MCH: 28.6 pg (ref 26.0–34.0)
MCHC: 31.4 g/dL (ref 30.0–36.0)
MCV: 91.2 fL (ref 78.0–100.0)
Platelets: 266 10*3/uL (ref 150–400)
RBC: 3.77 MIL/uL — ABNORMAL LOW (ref 3.87–5.11)
RDW: 15.2 % (ref 11.5–15.5)
WBC: 9.3 10*3/uL (ref 4.0–10.5)

## 2013-04-14 LAB — BASIC METABOLIC PANEL
BUN: 37 mg/dL — ABNORMAL HIGH (ref 6–23)
BUN: 42 mg/dL — ABNORMAL HIGH (ref 6–23)
CO2: 26 mEq/L (ref 19–32)
CO2: 25 mEq/L (ref 19–32)
Calcium: 9.3 mg/dL (ref 8.4–10.5)
Calcium: 8.9 mg/dL (ref 8.4–10.5)
Chloride: 96 mEq/L (ref 96–112)
Chloride: 92 mEq/L — ABNORMAL LOW (ref 96–112)
Creatinine, Ser: 1.63 mg/dL — ABNORMAL HIGH (ref 0.50–1.10)
Creatinine, Ser: 1.83 mg/dL — ABNORMAL HIGH (ref 0.50–1.10)
GFR calc Af Amer: 38 mL/min — ABNORMAL LOW (ref >90)
GFR calc Af Amer: 33 mL/min — ABNORMAL LOW (ref >90)
GFR calc non Af Amer: 33 mL/min — ABNORMAL LOW (ref >90)
GFR calc non Af Amer: 28 mL/min — ABNORMAL LOW (ref >90)
Glucose, Bld: 279 mg/dL — ABNORMAL HIGH (ref 70–99)
Glucose, Bld: 773 mg/dL (ref 70–99)
Potassium: 4.3 mEq/L (ref 3.5–5.1)
Potassium: 4.5 mEq/L (ref 3.5–5.1)
Sodium: 133 mEq/L — ABNORMAL LOW (ref 135–145)
Sodium: 132 mEq/L — ABNORMAL LOW (ref 135–145)

## 2013-04-14 LAB — PRO B NATRIURETIC PEPTIDE: Pro B Natriuretic peptide (BNP): 14124 pg/mL — ABNORMAL HIGH (ref 0–125)

## 2013-04-14 LAB — RETICULOCYTES
RBC.: 4.03 MIL/uL (ref 3.87–5.11)
Retic Count, Absolute: 56.4 10*3/uL (ref 19.0–186.0)
Retic Ct Pct: 1.4 % (ref 0.4–3.1)

## 2013-04-14 LAB — TROPONIN I
Troponin I: <0.3 ng/mL (ref <0.30)
Troponin I: <0.3 ng/mL (ref <0.30)

## 2013-04-14 LAB — GLUCOSE, RANDOM
Glucose, Bld: 8 mg/dL — CL (ref 70–99)
Glucose, Bld: 773 mg/dL (ref 70–99)

## 2013-04-14 MED ORDER — ALBUTEROL SULFATE (5 MG/ML) 0.5% IN NEBU
2.5000 mg | INHALATION_SOLUTION | RESPIRATORY_TRACT | Status: DC
  Administered 2013-04-14 – 2013-04-21 (×40): 2.5 mg via RESPIRATORY_TRACT
  Filled 2013-04-14 (×38): qty 0.5

## 2013-04-14 MED ORDER — IPRATROPIUM BROMIDE 0.02 % IN SOLN
0.5000 mg | RESPIRATORY_TRACT | Status: DC
  Administered 2013-04-14 – 2013-04-21 (×40): 0.5 mg via RESPIRATORY_TRACT
  Filled 2013-04-14 (×38): qty 2.5

## 2013-04-14 MED ORDER — FUROSEMIDE 10 MG/ML IJ SOLN
40.0000 mg | Freq: Three times a day (TID) | INTRAMUSCULAR | Status: DC
  Administered 2013-04-15: 40 mg via INTRAVENOUS
  Filled 2013-04-14: qty 4

## 2013-04-14 MED ORDER — ALPRAZOLAM 1 MG PO TABS
1.0000 mg | ORAL_TABLET | Freq: Three times a day (TID) | ORAL | Status: DC | PRN
  Administered 2013-04-14: 2 mg via ORAL
  Filled 2013-04-14: qty 2

## 2013-04-14 MED ORDER — INSULIN ASPART 100 UNIT/ML ~~LOC~~ SOLN: 0.0000 [IU] | Freq: Three times a day (TID) | SUBCUTANEOUS | Status: DC

## 2013-04-14 MED ORDER — ACETAMINOPHEN 325 MG PO TABS
650.0000 mg | ORAL_TABLET | Freq: Four times a day (QID) | ORAL | Status: DC | PRN
Start: 2013-04-14 — End: 2013-04-21

## 2013-04-14 MED ORDER — LEVOFLOXACIN 750 MG PO TABS
750.0000 mg | ORAL_TABLET | ORAL | Status: DC
  Administered 2013-04-14: 750 mg via ORAL
  Filled 2013-04-14: qty 1

## 2013-04-14 MED ORDER — SODIUM CHLORIDE 0.9 % IV SOLN
INTRAVENOUS | Status: DC
  Administered 2013-04-15: 5.4 [IU]/h via INTRAVENOUS
  Filled 2013-04-14: qty 1

## 2013-04-14 MED ORDER — HYDROCODONE-ACETAMINOPHEN 5-325 MG PO TABS
1.0000 | ORAL_TABLET | ORAL | Status: DC | PRN
  Administered 2013-04-20: 1 via ORAL
  Filled 2013-04-14: qty 1

## 2013-04-14 MED ORDER — ASPIRIN EC 325 MG PO TBEC
325.0000 mg | DELAYED_RELEASE_TABLET | Freq: Every day | ORAL | Status: DC
  Administered 2013-04-14 – 2013-04-21 (×8): 325 mg via ORAL
  Filled 2013-04-14 (×8): qty 1

## 2013-04-14 MED ORDER — DILTIAZEM HCL ER COATED BEADS 240 MG PO CP24
240.0000 mg | ORAL_CAPSULE | Freq: Every day | ORAL | Status: DC
  Administered 2013-04-14 – 2013-04-21 (×8): 240 mg via ORAL
  Filled 2013-04-14 (×8): qty 1

## 2013-04-14 MED ORDER — PREDNISONE 20 MG PO TABS
40.0000 mg | ORAL_TABLET | Freq: Every day | ORAL | Status: DC
  Administered 2013-04-15 – 2013-04-17 (×3): 40 mg via ORAL
  Filled 2013-04-14 (×3): qty 2

## 2013-04-14 MED ORDER — SODIUM CHLORIDE 0.9 % IV SOLN
INTRAVENOUS | Status: DC
  Administered 2013-04-14: 19:00:00 via INTRAVENOUS

## 2013-04-14 MED ORDER — SODIUM CHLORIDE 0.9 % IJ SOLN
3.0000 mL | Freq: Two times a day (BID) | INTRAMUSCULAR | Status: DC
  Administered 2013-04-14 – 2013-04-21 (×12): 3 mL via INTRAVENOUS

## 2013-04-14 MED ORDER — INSULIN GLARGINE 100 UNIT/ML ~~LOC~~ SOLN
20.0000 [IU] | Freq: Every day | SUBCUTANEOUS | Status: DC
  Administered 2013-04-14: 20 [IU] via SUBCUTANEOUS
  Filled 2013-04-14 (×2): qty 0.2

## 2013-04-14 MED ORDER — LEVOTHYROXINE SODIUM 100 MCG PO TABS
250.0000 ug | ORAL_TABLET | Freq: Every day | ORAL | Status: DC
  Administered 2013-04-15 – 2013-04-21 (×7): 250 ug via ORAL
  Filled 2013-04-14: qty 2
  Filled 2013-04-14: qty 1
  Filled 2013-04-14 (×2): qty 2
  Filled 2013-04-14 (×3): qty 1
  Filled 2013-04-14 (×5): qty 2
  Filled 2013-04-14: qty 1
  Filled 2013-04-14: qty 2
  Filled 2013-04-14 (×2): qty 1

## 2013-04-14 MED ORDER — ENOXAPARIN SODIUM 60 MG/0.6ML ~~LOC~~ SOLN
50.0000 mg | SUBCUTANEOUS | Status: DC
  Administered 2013-04-14 – 2013-04-20 (×7): 50 mg via SUBCUTANEOUS
  Filled 2013-04-14 (×7): qty 0.6

## 2013-04-14 MED ORDER — GUAIFENESIN-DM 100-10 MG/5ML PO SYRP: 5.0000 mL | ORAL_SOLUTION | ORAL | Status: DC | PRN

## 2013-04-14 MED ORDER — ACETAMINOPHEN 650 MG RE SUPP: 650.0000 mg | Freq: Four times a day (QID) | RECTAL | Status: DC | PRN

## 2013-04-14 MED ORDER — METHYLPREDNISOLONE SODIUM SUCC 125 MG IJ SOLR
125.0000 mg | Freq: Once | INTRAMUSCULAR | Status: AC
  Administered 2013-04-14: 125 mg via INTRAVENOUS
  Filled 2013-04-14: qty 2

## 2013-04-14 MED ORDER — IPRATROPIUM BROMIDE 0.02 % IN SOLN
1.0000 mg | Freq: Once | RESPIRATORY_TRACT | Status: AC
  Administered 2013-04-14: 1 mg via RESPIRATORY_TRACT
  Filled 2013-04-14: qty 5

## 2013-04-14 MED ORDER — INSULIN ASPART 100 UNIT/ML ~~LOC~~ SOLN: 0.0000 [IU] | Freq: Every day | SUBCUTANEOUS | Status: DC

## 2013-04-14 MED ORDER — ONDANSETRON HCL 4 MG/2ML IJ SOLN: 4.0000 mg | Freq: Four times a day (QID) | INTRAMUSCULAR | Status: DC | PRN

## 2013-04-14 MED ORDER — DEXTROSE 50 % IV SOLN: 25.0000 mL | INTRAVENOUS | Status: DC | PRN

## 2013-04-14 MED ORDER — ALPRAZOLAM 1 MG PO TABS
1.0000 mg | ORAL_TABLET | Freq: Three times a day (TID) | ORAL | Status: DC | PRN
  Administered 2013-04-15 – 2013-04-16 (×2): 1 mg via ORAL
  Filled 2013-04-14: qty 2
  Filled 2013-04-14: qty 1

## 2013-04-14 MED ORDER — FUROSEMIDE 10 MG/ML IJ SOLN
40.0000 mg | Freq: Four times a day (QID) | INTRAMUSCULAR | Status: DC
  Administered 2013-04-14: 40 mg via INTRAVENOUS
  Filled 2013-04-14: qty 4

## 2013-04-14 MED ORDER — ALBUTEROL SULFATE (5 MG/ML) 0.5% IN NEBU: 2.5000 mg | INHALATION_SOLUTION | RESPIRATORY_TRACT | Status: DC | PRN

## 2013-04-14 MED ORDER — POTASSIUM CHLORIDE CRYS ER 10 MEQ PO TBCR
20.0000 meq | EXTENDED_RELEASE_TABLET | Freq: Two times a day (BID) | ORAL | Status: DC
  Administered 2013-04-14 – 2013-04-21 (×14): 20 meq via ORAL
  Filled 2013-04-14 (×17): qty 2

## 2013-04-14 MED ORDER — ONDANSETRON HCL 4 MG PO TABS: 4.0000 mg | ORAL_TABLET | Freq: Four times a day (QID) | ORAL | Status: DC | PRN

## 2013-04-14 MED ORDER — MORPHINE SULFATE 2 MG/ML IJ SOLN: 1.0000 mg | INTRAMUSCULAR | Status: DC | PRN

## 2013-04-14 MED ORDER — ALBUTEROL (5 MG/ML) CONTINUOUS INHALATION SOLN
15.0000 mg/h | INHALATION_SOLUTION | Freq: Once | RESPIRATORY_TRACT | Status: AC
  Administered 2013-04-14: 15 mg/h via RESPIRATORY_TRACT
  Filled 2013-04-14: qty 20

## 2013-04-14 MED ORDER — BISACODYL 10 MG RE SUPP: 10.0000 mg | Freq: Every day | RECTAL | Status: DC | PRN

## 2013-04-14 MED ORDER — ALUM & MAG HYDROXIDE-SIMETH 200-200-20 MG/5ML PO SUSP
30.0000 mL | Freq: Four times a day (QID) | ORAL | Status: DC | PRN
  Administered 2013-04-14: 30 mL via ORAL
  Filled 2013-04-14: qty 30

## 2013-04-14 MED ORDER — SENNOSIDES-DOCUSATE SODIUM 8.6-50 MG PO TABS
1.0000 | ORAL_TABLET | Freq: Every evening | ORAL | Status: DC | PRN
  Administered 2013-04-15: 1 via ORAL
  Filled 2013-04-14: qty 1

## 2013-04-14 MED ORDER — ALBUTEROL SULFATE (5 MG/ML) 0.5% IN NEBU: 2.5000 mg | INHALATION_SOLUTION | RESPIRATORY_TRACT | Status: DC

## 2013-04-14 MED ORDER — FUROSEMIDE 10 MG/ML IJ SOLN
40.0000 mg | Freq: Once | INTRAMUSCULAR | Status: AC
  Administered 2013-04-14: 40 mg via INTRAVENOUS
  Filled 2013-04-14: qty 4

## 2013-04-14 MED ORDER — SODIUM CHLORIDE 0.9 % IV SOLN
INTRAVENOUS | Status: DC
  Administered 2013-04-15: 20 mL/h via INTRAVENOUS

## 2013-04-14 MED ORDER — MECLIZINE HCL 12.5 MG PO TABS
25.0000 mg | ORAL_TABLET | Freq: Two times a day (BID) | ORAL | Status: DC
  Administered 2013-04-14 – 2013-04-21 (×14): 25 mg via ORAL
  Filled 2013-04-14 (×14): qty 2

## 2013-04-14 MED ORDER — GABAPENTIN 300 MG PO CAPS
600.0000 mg | ORAL_CAPSULE | Freq: Four times a day (QID) | ORAL | Status: DC
  Administered 2013-04-14 – 2013-04-21 (×28): 600 mg via ORAL
  Filled 2013-04-14 (×20): qty 2
  Filled 2013-04-14: qty 6
  Filled 2013-04-14 (×8): qty 2

## 2013-04-14 NOTE — ED Notes (Signed)
Report given to Karen,RN unit 300. Ready to receive patient. 

## 2013-04-14 NOTE — H&P (Signed)
[facility] History and Physical  [name] ZOX:[id number] DOB: May 12, 1951 DOA: 04/14/2013  Referring physician:  PCP: Toma Deiters, MD  Specialists:   Chief Complaint:   HPI: Stephanie Combs is a 62 y.o. female with past medical history that includes COPD she is oxygen dependent at home, CHF, diabetes, hypertension, chronic kidney disease, lower extremity edema presents to the emergency department with chief complaint of worsening shortness of breath and cough. Information is obtained from the patient. She reports that approximately 3 weeks ago she developed worsening shortness of breath and she went to [facility]. She was informed that she had "fluid on the lungs". She reports they "got the fluid off of me and 70 home". Liver she states that she never felt like her breathing improved. Over the next several days her dyspnea worsened and she developed worsening lower extremity edema. 3 days ago she developed wheezing and moist nonproductive cough. Associated symptoms include nausea with one episode of emesis, dizziness with position change, intermittent chest pain, 3 episodes of diarrhea. She denies any coffee ground emesis or melena or bright red blood per rectum. She denies any palpitations headache visual disturbances. She reports that chest pain is located anteriorly left and right. She describes it as a pressure that is worse with position change. She states episodes are brief and rates the intensity 8/10. She also indicates that her lower extremity edema worsened particularly on the right leg. In the emergency room initial evaluation significant for sodium of 133, creatinine of 1.63, glucose of 279, proBNP 14,124, white blood cells 11.6. Chest x-ray yields slightly increased cardiomegaly, worsened pulmonary vascular  congestion and mild interstitial edema consistent with mild-moderate CHF. EKG yields a normal sinus rhythm with T-wave inversion that is a change when compared to  the EKG done in March of this year. We are asked to admit   Review of Systems: 10 point review of systems completed and all systems are negative except as indicated in the history of present illness  Past Medical History  Diagnosis Date  . Hypertension   . Diabetes mellitus without complication   . COPD (chronic obstructive pulmonary disease)   . CHF (congestive heart failure)   . Anxiety   . Chronic back pain   . Hypothyroidism   . Pedal edema   . On home O2     2.5 L N/C prn   Past Surgical History  Procedure Laterality Date  . Tubal ligation    . Hernia repair     Social History:  reports that she has been smoking Cigarettes.  She has been smoking about 0.25 packs per day. She does not have any smokeless tobacco history on file. She reports that she does not drink alcohol or use illicit drugs. She lives at home alone. She uses a walker to ambulate. Allergies  Allergen Reactions  . Dilaudid [Hydromorphone Hcl]   . Oxycontin [Oxycodone]   . Reglan [Metoclopramide]   . Valium [Diazepam]   . Vistaril [Hydroxyzine Hcl]     No family history on file. MI medical history reviewed and noncontributory to the admission of this leg  Prior to Admission medications   Medication Sig Start Date End Date Taking? Authorizing Provider  alprazolam Prudy Feeler) 2 MG tablet Take 1-2 mg by mouth 4 (four) times daily as needed. Sometimes patient only takes 1mg , and she does not take 4 daily unless needed for anxiety   Yes Historical Provider, MD  aspirin 325 MG EC tablet Take 325 mg  by mouth daily. Ecotrin OTC   Yes Historical Provider, MD  benazepril (LOTENSIN) 10 MG tablet Take 10 mg by mouth daily. 03/29/13  Yes Historical Provider, MD  diltiazem (CARDIZEM CD) 240 MG 24 hr capsule Take 240 mg by mouth daily. 03/17/13  Yes Historical Provider, MD  furosemide (LASIX) 20 MG tablet Take 20 mg by mouth 2 (two) times daily. 02/18/13  Yes Historical Provider, MD  gabapentin (NEURONTIN) 600 MG tablet Take  600 mg by mouth 4 (four) times daily. 03/02/13  Yes Historical Provider, MD  glyBURIDE-metformin (GLUCOVANCE) 5-500 MG per tablet Take 2 tablets by mouth 2 (two) times daily with a meal.   Yes Historical Provider, MD  HYDROcodone-acetaminophen (NORCO/VICODIN) 5-325 MG per tablet Take 1 tablet by mouth every 4 (four) hours as needed for pain. 02/25/12  Yes Tammy L. Triplett, PA-C  insulin aspart (NOVOLOG) 100 UNIT/ML injection Inject 20 Units into the skin daily. Takes back to back with Levemir   Yes Historical Provider, MD  insulin detemir (LEVEMIR) 100 UNIT/ML injection Inject 40 Units into the skin daily.    Yes Historical Provider, MD  levothyroxine (SYNTHROID, LEVOTHROID) 125 MCG tablet Take 250 mcg by mouth daily before breakfast.   Yes Historical Provider, MD  meclizine (ANTIVERT) 25 MG tablet Take 25 mg by mouth 2 (two) times daily. 04/06/13  Yes Historical Provider, MD  Omega-3 Fatty Acids (FISH OIL PO) Take 1 capsule by mouth daily.   Yes Historical Provider, MD  potassium chloride (K-DUR) 10 MEQ tablet Take 20 mEq by mouth 2 (two) times daily. 03/29/13  Yes Historical Provider, MD  PROAIR HFA 108 (90 BASE) MCG/ACT inhaler Inhale 1 puff into the lungs every 6 (six) hours as needed. For shortness of breath 04/12/13  Yes Historical Provider, MD  torsemide (DEMADEX) 20 MG tablet Take 20 mg by mouth daily.    Yes Historical Provider, MD   Physical Exam: Filed Vitals:   04/14/13 1300  BP: 123/63  Pulse: 93  Temp:   Resp: 24     General:  Obese somewhat ill appearing no acute distress  Eyes: PERRLA, EOMI, no scleral icterus  ENT: Ears are clear nose without drainage oropharynx without erythema or exudate. Mucous membranes of her mouth are pink slightly dry  Neck: Supple full range of motion no lymphadenopathy  Cardiovascular: Regular rate and rhythm no murmur no gallop no rub trace to 1+ pitting edema on right lower extremity trace edema on left lower extremity. Bilateral feet  particularly puffy  Respiratory: Increased work of breathing with conversation. Use of abdominal accessory muscles. Difficulty completing a sentence. Sounds are somewhat diminished particularly in the bases otherwise very coarse with scattered rhonchi and fine crackles. Hear very little wheezing  Abdomen: Obese soft positive bowel sounds nontender to palpation no mass organomegaly noted  Skin: Warm and dry no rash no lesions noted  Musculoskeletal: Fair muscle tone. Joints without swelling/erythema. No clubbing  Psychiatric: Calm cooperative  Neurologic: Cranial nerves II through XII grossly intact speech is clear but very soft moves all extremities  Labs on Admission:  Basic Metabolic Panel:  Recent Labs Lab 04/14/13 1315  NA 133*  K 4.3  CL 96  CO2 26  GLUCOSE 279*  BUN 37*  CREATININE 1.63*  CALCIUM 9.3   Liver Function Tests: No results found for this basename: AST, ALT, ALKPHOS, BILITOT, PROT, ALBUMIN,  in the last 168 hours No results found for this basename: LIPASE, AMYLASE,  in the last 168 hours No results  found for this basename: AMMONIA,  in the last 168 hours CBC:  Recent Labs Lab 04/14/13 1315  WBC 11.6*  NEUTROABS 8.7*  HGB 11.6*  HCT 36.1  MCV 88.0  PLT 251   Cardiac Enzymes:  Recent Labs Lab 04/14/13 1315  TROPONINI <0.30    BNP (last 3 results)  Recent Labs  04/14/13 1315  PROBNP 14124.0*   CBG: No results found for this basename: GLUCAP,  in the last 168 hours  Radiological Exams on Admission: Dg Chest Portable 1 View  04/14/2013   CLINICAL DATA:  Short of breath  EXAM: PORTABLE CHEST - 1 VIEW  COMPARISON:  Prior chest x-ray 04/05/2013  FINDINGS: The patient is markedly rotated toward the right. Given rotation and differences in technique, the heart appears slightly enlarged compared to prior. Additionally, there is increased pulmonary vascular congestion now with mild interstitial edema. No large pleural effusion identified. No  pneumothorax. A skin fold projects over the left lung field. No acute osseous abnormality.  IMPRESSION: Slightly increased cardiomegaly, worsened pulmonary vascular congestion and mild interstitial edema consistent with mild-moderate CHF.   Electronically Signed   By: Malachy Moan M.D.   On: 04/14/2013 13:49    EKG: Independently reviewed  Normal sinus rhythm with T-wave inversion when compared to EKG from March of this year.  Assessment/Plan Principal Problem:   Acute on chronic diastolic HF (heart failure): The setting of COPD exacerbation. Trigger unclear. Patient reports compliance with her medication. Will admit to telemetry. Will continue IV Lasix at 40 mg every 6 hours. Will closely monitor intake and output. Will obtain daily weights. Will repeat echo cardiogram to assess LV function given EKG changes. Of note last echo done in March of this year which yielded an EF of 60% and grade 2 diastolic dysfunction. Will request cardiology consult. Active Problems:  COPD with exacerbation: Trigger unclear. Patient with gradual worsening symptoms over the last 3 weeks. Provide nebulizers every 4 hours. Will hold off on steroids for now. We'll continue oxygen supplement patient. Monitor sats closely. Provide Levaquin  Chest pain. Atypical. EKG with some T-wave inversions when compared to EKG done in March of this year. Cycle her cardiac enzymes. Will repeat EKG in the a.m. Will provide oxygen morphine and Zofran as needed. Will request a cardiology consult    Hyponatremia: Mild likely related to decreased by mouth intake over the last several days. In the anticipated diuresis will keep a close eye on her electrolytes. Will correct as indicated.    Leukocytosis: Mild. Likely reactive. Patient is currently afebrile and hemodynamically stable. She is nontoxic appearing. Will monitor closely.   CKD (chronic kidney disease), stage III: Chart review indicates history of chronic kidney disease. Current  creatinine is 1.6. He is requiring Lasix due to problem #1. Hold any other nephrotoxins. On its her urine output. Monitor closely    DM type 2 (diabetes mellitus, type 2): Patient reports decreased by mouth intake and that her CBGs have been running "low in the 90s over the last several days. Will continue her Lantus at half her home dose. Will use sliding scale insulin for optimal glycemic control. Will check a hemoglobin A1c.    HTN (hypertension), benign: Stable while in the emergency department. I medications include diltiazem M. and Lasix and torsemide. Will continue the diltiazem M. Lasix IV due to #1. Will hold Demadex    Anemia: Likely related to chronic disease. Will obtain an anemia panel.    COPD (chronic obstructive pulmonary disease):  See #2    Morbid obesity: BMI 38.5. Will request nutritional consult    Cardiology consult ordered    Code Status: full Family Communication: none presenty Disposition Plan: home when ready. Likely 2-3 days  Time spent: 60 minutes  Stephanie Combs [facility] Pager [phone]  If 7PM-7AM, please contact night-coverage [email or web address] Password [email or web address] 04/14/2013, 2:57 PM

## 2013-04-14 NOTE — ED Notes (Signed)
Patient attempted to use bed pan x 2. NO success. States she needs to urinate, but unable. MD made aware and order for foley catheter obtained.

## 2013-04-14 NOTE — ED Notes (Signed)
Patient's oxygen decreased to 2.5 liters per minute [place] per MD.

## 2013-04-14 NOTE — ED Notes (Signed)
Attempted to call report. Clydie Braun, RN to call back.

## 2013-04-14 NOTE — H&P (Signed)
Patient seen and examined. Agree with note as above per Toya Smothers, NP with the exceptions as below:  This patient was admitted for worsening shortness of breath. She does have history of diastolic congestive heart failure as well as COPD on home oxygen. She presents with wheezing as well as evidence of volume overload. She is also coughing. It is difficult to say if her symptoms apparently related to volume overload, or if there is a component of COPD exacerbation. I suspect that there are elements of both. The patient has been started on IV diuretics. We will continue her nebulizer treatments, antibiotics as well as give her a short course of prednisone. She was noted to have some EKG changes and therefore cardiac enzymes will be cycled, echocardiogram will be repeated and we will request a cardiology consultation. Her lower extremity edema it does appear somewhat chronic, but chest x-ray does indicate evidence of volume overload. She does have some elements of chronic kidney disease, stage III. We'll continue to monitor creatinine closely while we are diuresing her.  Jimi Schappert

## 2013-04-14 NOTE — ED Notes (Signed)
Patient arrives via EMS from home with c/o progressively worsening shortness of breath for past week. H/o CHF. Swelling noted to lower extremities. Patient alert. Audible wheezing noted. EMS reports rales in lower lobes bilaterally. 1 duo neb given by EMS PTA.

## 2013-04-14 NOTE — ED Provider Notes (Signed)
CSN: [id number]     Arrival date & time 04/14/13  1249 History   First MD Initiated Contact with Patient 04/14/13 1307     Chief Complaint  Patient presents with  . Shortness of Breath    HPI Pt was seen at 1315.  Per pt, c/o gradual onset and worsening of persistent cough, wheezing and SOB for the past 1 week. Describes the cough as "moist."  Describes her symptoms as "it's either my COPD or my CHF acting up."  Has been using home MDI without relief. EMS gave neb en route with "some" improvement. Denies CP/palpitations, no back pain, no abd pain, no N/V/D, no fevers, no rash.    Past Medical History  Diagnosis Date  . Hypertension   . Diabetes mellitus without complication   . COPD (chronic obstructive pulmonary disease)   . CHF (congestive heart failure)   . Anxiety   . Chronic back pain   . Hypothyroidism   . Pedal edema   . On home O2     2.5 L N/C prn   Past Surgical History  Procedure Laterality Date  . Tubal ligation    . Hernia repair      History  Substance Use Topics  . Smoking status: Current Every Day Smoker -- 0.25 packs/day    Types: Cigarettes  . Smokeless tobacco: Not on file  . Alcohol Use: No    Review of Systems ROS: Statement: All systems negative except as marked or noted in the HPI; Constitutional: Negative for fever and chills. ; ; Eyes: Negative for eye pain, redness and discharge. ; ; ENMT: Negative for ear pain, hoarseness, nasal congestion, sinus pressure and sore throat. ; ; Cardiovascular: Negative for chest pain, palpitations, diaphoresis and peripheral edema. ; ; Respiratory: +SOB, cough, wheezing. Negative for stridor. ; ; Gastrointestinal: Negative for nausea, vomiting, diarrhea, abdominal pain, blood in stool, hematemesis, jaundice and rectal bleeding. . ; ; Genitourinary: Negative for dysuria, flank pain and hematuria. ; ; Musculoskeletal: Negative for back pain and neck pain. Negative for swelling and trauma.; ; Skin: Negative for pruritus,  rash, abrasions, blisters, bruising and skin lesion.; ; Neuro: Negative for headache, lightheadedness and neck stiffness. Negative for weakness, altered level of consciousness , altered mental status, extremity weakness, paresthesias, involuntary movement, seizure and syncope.       Allergies  Dilaudid; Oxycontin; Reglan; Valium; and Vistaril  Home Medications   Current Outpatient Rx  Name  Route  Sig  Dispense  Refill  . alprazolam (XANAX) 2 MG tablet   Oral   Take 1-2 mg by mouth 4 (four) times daily as needed. Sometimes patient only takes 1mg , and she does not take 4 daily unless needed for anxiety         . aspirin 325 MG EC tablet   Oral   Take 325 mg by mouth daily. Ecotrin OTC         . benazepril (LOTENSIN) 10 MG tablet   Oral   Take 10 mg by mouth daily.         Marland Kitchen diltiazem (CARDIZEM CD) 240 MG 24 hr capsule   Oral   Take 240 mg by mouth daily.         . furosemide (LASIX) 20 MG tablet   Oral   Take 20 mg by mouth 2 (two) times daily.         Marland Kitchen gabapentin (NEURONTIN) 600 MG tablet   Oral   Take 600 mg by mouth 4 (  four) times daily.         Marland Kitchen glyBURIDE-metformin (GLUCOVANCE) 5-500 MG per tablet   Oral   Take 2 tablets by mouth 2 (two) times daily with a meal.         . HYDROcodone-acetaminophen (NORCO/VICODIN) 5-325 MG per tablet   Oral   Take 1 tablet by mouth every 4 (four) hours as needed for pain.         Marland Kitchen insulin aspart (NOVOLOG) 100 UNIT/ML injection   Subcutaneous   Inject 20 Units into the skin daily. Takes back to back with Levemir         . insulin detemir (LEVEMIR) 100 UNIT/ML injection   Subcutaneous   Inject 40 Units into the skin daily.          Marland Kitchen levothyroxine (SYNTHROID, LEVOTHROID) 125 MCG tablet   Oral   Take 250 mcg by mouth daily before breakfast.         . meclizine (ANTIVERT) 25 MG tablet   Oral   Take 25 mg by mouth 2 (two) times daily.         . Omega-3 Fatty Acids (FISH OIL PO)   Oral   Take 1  capsule by mouth daily.         . potassium chloride (K-DUR) 10 MEQ tablet   Oral   Take 20 mEq by mouth 2 (two) times daily.         Marland Kitchen PROAIR HFA 108 (90 BASE) MCG/ACT inhaler   Inhalation   Inhale 1 puff into the lungs every 6 (six) hours as needed. For shortness of breath         . torsemide (DEMADEX) 20 MG tablet   Oral   Take 20 mg by mouth daily.           BP 123/63  Pulse 93  Temp(Src) 98.3 F (36.8 C) (Oral)  Resp 24  Ht 5\' 6"  (1.676 m)  Wt 238 lb (107.956 kg)  BMI 38.43 kg/m2  SpO2 96% Physical Exam 1320: Physical examination:  Nursing notes reviewed; Vital signs and O2 SAT reviewed;  Constitutional: Well developed, Well nourished, Uncomfortable appearing; Head:  Normocephalic, atraumatic; Eyes: EOMI, PERRL, No scleral icterus; ENMT: Mouth and pharynx normal, Mucous membranes dry; Neck: Supple, Full range of motion, No lymphadenopathy; Cardiovascular: Regular rate and rhythm, No gallop; Respiratory: Breath sounds dimnished & equal bilaterally, insp/exp wheezes bilat, moist cough. Sitting upright. Speaking in long phrases. Tachypneic; Chest: Nontender, Movement normal; Abdomen: Soft, Nontender, Nondistended, Normal bowel sounds; Genitourinary: No CVA tenderness; Extremities: Pulses normal, No tenderness, +2 pedal edema bilat without calf asymmetry.; Neuro: AA&Ox3, Major CN grossly intact.  Speech clear. No gross focal motor or sensory deficits in extremities.; Skin: Color normal, Warm, Dry.   ED Course  Procedures  EKG Interpretation    Date/Time:  Thursday April 14 2013 12:49:22 EST Ventricular Rate:  95 PR Interval:  150 QRS Duration: 82 QT Interval:  374 QTC Calculation: 469 R Axis:   70 Text Interpretation:  Normal sinus rhythm Low voltage QRS T wave abnormality, consider anterior ischemia Abnormal ECG When compared with ECG of 08-Aug-2012 00:48, T wave inversion now evident in Anterior leads Confirmed by HORTON  MD, Toni Amend ([id number]) on 04/14/2013 1:01:57  PM            MDM  MDM Reviewed: previous chart, nursing note and vitals Reviewed previous: labs and ECG Interpretation: labs, ECG and x-ray Total time providing critical care: 30-74 minutes. This excludes time  spent performing separately reportable procedures and services. Consults: admitting MD     CRITICAL CARE Performed by: Laray Anger Total critical care time: 40 Critical care time was exclusive of separately billable procedures and treating other patients. Critical care was necessary to treat or prevent imminent or life-threatening deterioration. Critical care was time spent personally by me on the following activities: development of treatment plan with patient and/or surrogate as well as nursing, discussions with consultants, evaluation of patient's response to treatment, examination of patient, obtaining history from patient or surrogate, ordering and performing treatments and interventions, ordering and review of laboratory studies, ordering and review of radiographic studies, pulse oximetry and re-evaluation of patient's condition.   Date: 04/14/2013  Rate: 95  Rhythm: normal sinus rhythm  QRS Axis: normal  Intervals: normal  ST/T Wave abnormalities: nonspecific T wave changes, TWI leads V1-V4  Conduction Disutrbances:none  Narrative Interpretation:   Old EKG Reviewed: changes noted; TWI new since previous EKG dated 08/08/2012.   Results for orders placed during the hospital encounter of 04/14/13  BASIC METABOLIC PANEL      Result Value Range   Sodium 133 (*) 135 - 145 mEq/L   Potassium 4.3  3.5 - 5.1 mEq/L   Chloride 96  96 - 112 mEq/L   CO2 26  19 - 32 mEq/L   Glucose, Bld 279 (*) 70 - 99 mg/dL   BUN 37 (*) 6 - 23 mg/dL   Creatinine, Ser 8.11 (*) 0.50 - 1.10 mg/dL   Calcium 9.3  8.4 - 91.4 mg/dL   GFR calc non Af Amer 33 (*) >90 mL/min   GFR calc Af Amer 38 (*) >90 mL/min  CBC WITH DIFFERENTIAL      Result Value Range   WBC 11.6 (*) 4.0 - 10.5  K/uL   RBC 4.10  3.87 - 5.11 MIL/uL   Hemoglobin 11.6 (*) 12.0 - 15.0 g/dL   HCT 78.2  95.6 - 21.3 %   MCV 88.0  78.0 - 100.0 fL   MCH 28.3  26.0 - 34.0 pg   MCHC 32.1  30.0 - 36.0 g/dL   RDW 08.6  57.8 - 46.9 %   Platelets 251  150 - 400 K/uL   Neutrophils Relative % 75  43 - 77 %   Neutro Abs 8.7 (*) 1.7 - 7.7 K/uL   Lymphocytes Relative 17  12 - 46 %   Lymphs Abs 1.9  0.7 - 4.0 K/uL   Monocytes Relative 8  3 - 12 %   Monocytes Absolute 0.9  0.1 - 1.0 K/uL   Eosinophils Relative 0  0 - 5 %   Eosinophils Absolute 0.0  0.0 - 0.7 K/uL   Basophils Relative 0  0 - 1 %   Basophils Absolute 0.1  0.0 - 0.1 K/uL  TROPONIN I      Result Value Range   Troponin I <0.30  <0.30 ng/mL  PRO B NATRIURETIC PEPTIDE      Result Value Range   Pro B Natriuretic peptide (BNP) 14124.0 (*) 0 - 125 pg/mL   Dg Chest Portable 1 View 04/14/2013   CLINICAL DATA:  Short of breath  EXAM: PORTABLE CHEST - 1 VIEW  COMPARISON:  Prior chest x-ray 04/05/2013  FINDINGS: The patient is markedly rotated toward the right. Given rotation and differences in technique, the heart appears slightly enlarged compared to prior. Additionally, there is increased pulmonary vascular congestion now with mild interstitial edema. No large pleural effusion identified. No pneumothorax. A  skin fold projects over the left lung field. No acute osseous abnormality.  IMPRESSION: Slightly increased cardiomegaly, worsened pulmonary vascular congestion and mild interstitial edema consistent with mild-moderate CHF.   Electronically Signed   By: Malachy Moan M.D.   On: 04/14/2013 13:49    1325:  Pt tachypneic on arrival, sitting upright, lungs diminished with wheezing bilat. Pt states she has hx CHF and COPD; will start hour long neb, IV steroid and IV lasix.    1415:  Hour long neb in progress with Sats 96%. Continues to deny CP. Dx and testing d/w pt.  Questions answered.  Verb understanding, agreeable to admit.  T/C to [facility]. Kerry Hough, case  discussed, including:  HPI, pertinent PM/SHx, VS/PE, dx testing, ED course and treatment:  Agreeable to admit, requests to write temporary orders, obtain tele bed to team 2.   Laray Anger, DO 04/16/13 1517

## 2013-04-14 NOTE — Progress Notes (Signed)
Shift event: RN paged this NP secondary to pt's CBG reading "high" only. Stat glucose revealed a sugar of 773. Stat BMP ordered to check anion gap/acidosis. I suspect HONK and not DKA given this high of a sugar. Orders placed for insulin drip. IVF at [facility] secondary to CHF decompensation. If DKA, will add appropriate orders to ones placed already. Will make pt NPO. RN informed me that pt has to be transferred to SDU for use of insulin gtt. Order for transfer placed.  Pt did receive Lantus 20 U around 7pm.  Other treatment depends on BMP results. Will also recheck a BMP in am. CBGs q1hr while on gtt.  Report given to MD, Dr. Rito Ehrlich, who is present at [facility] and will take over floor calls after 1am.  Jimmye Norman, NP [facility]

## 2013-04-15 ENCOUNTER — Inpatient Hospital Stay (HOSPITAL_COMMUNITY): Payer: Medicare Other

## 2013-04-15 ENCOUNTER — Encounter (HOSPITAL_COMMUNITY): Payer: Self-pay | Admitting: Adult Health

## 2013-04-15 DIAGNOSIS — I10 Essential (primary) hypertension: Secondary | ICD-10-CM

## 2013-04-15 DIAGNOSIS — I5033 Acute on chronic diastolic (congestive) heart failure: Secondary | ICD-10-CM | POA: Diagnosis present

## 2013-04-15 DIAGNOSIS — N183 Chronic kidney disease, stage 3 unspecified: Secondary | ICD-10-CM

## 2013-04-15 DIAGNOSIS — J449 Chronic obstructive pulmonary disease, unspecified: Secondary | ICD-10-CM

## 2013-04-15 DIAGNOSIS — I509 Heart failure, unspecified: Secondary | ICD-10-CM

## 2013-04-15 DIAGNOSIS — J9620 Acute and chronic respiratory failure, unspecified whether with hypoxia or hypercapnia: Secondary | ICD-10-CM

## 2013-04-15 DIAGNOSIS — I369 Nonrheumatic tricuspid valve disorder, unspecified: Secondary | ICD-10-CM

## 2013-04-15 LAB — GLUCOSE, CAPILLARY
Glucose-Capillary: 117 mg/dL — ABNORMAL HIGH (ref 70–99)
Glucose-Capillary: 125 mg/dL — ABNORMAL HIGH (ref 70–99)
Glucose-Capillary: 153 mg/dL — ABNORMAL HIGH (ref 70–99)
Glucose-Capillary: 175 mg/dL — ABNORMAL HIGH (ref 70–99)
Glucose-Capillary: 179 mg/dL — ABNORMAL HIGH (ref 70–99)
Glucose-Capillary: 184 mg/dL — ABNORMAL HIGH (ref 70–99)
Glucose-Capillary: 203 mg/dL — ABNORMAL HIGH (ref 70–99)
Glucose-Capillary: 210 mg/dL — ABNORMAL HIGH (ref 70–99)
Glucose-Capillary: 214 mg/dL — ABNORMAL HIGH (ref 70–99)
Glucose-Capillary: 230 mg/dL — ABNORMAL HIGH (ref 70–99)
Glucose-Capillary: 356 mg/dL — ABNORMAL HIGH (ref 70–99)
Glucose-Capillary: 392 mg/dL — ABNORMAL HIGH (ref 70–99)
Glucose-Capillary: 476 mg/dL — ABNORMAL HIGH (ref 70–99)
Glucose-Capillary: 549 mg/dL — ABNORMAL HIGH (ref 70–99)
Glucose-Capillary: 94 mg/dL (ref 70–99)
Glucose-Capillary: >600 mg/dL (ref 70–99)
Glucose-Capillary: >600 mg/dL (ref 70–99)
Glucose-Capillary: >600 mg/dL (ref 70–99)

## 2013-04-15 LAB — BASIC METABOLIC PANEL
BUN: 42 mg/dL — ABNORMAL HIGH (ref 6–23)
BUN: 42 mg/dL — ABNORMAL HIGH (ref 6–23)
BUN: 42 mg/dL — ABNORMAL HIGH (ref 6–23)
CO2: 25 mEq/L (ref 19–32)
CO2: 26 mEq/L (ref 19–32)
CO2: 28 mEq/L (ref 19–32)
Calcium: 8.7 mg/dL (ref 8.4–10.5)
Calcium: 9.2 mg/dL (ref 8.4–10.5)
Calcium: 9.3 mg/dL (ref 8.4–10.5)
Chloride: 94 mEq/L — ABNORMAL LOW (ref 96–112)
Chloride: 97 mEq/L (ref 96–112)
Chloride: 99 mEq/L (ref 96–112)
Creatinine, Ser: 1.66 mg/dL — ABNORMAL HIGH (ref 0.50–1.10)
Creatinine, Ser: 1.64 mg/dL — ABNORMAL HIGH (ref 0.50–1.10)
Creatinine, Ser: 1.61 mg/dL — ABNORMAL HIGH (ref 0.50–1.10)
GFR calc Af Amer: 37 mL/min — ABNORMAL LOW (ref >90)
GFR calc Af Amer: 37 mL/min — ABNORMAL LOW (ref >90)
GFR calc Af Amer: 38 mL/min — ABNORMAL LOW (ref >90)
GFR calc non Af Amer: 32 mL/min — ABNORMAL LOW (ref >90)
GFR calc non Af Amer: 32 mL/min — ABNORMAL LOW (ref >90)
GFR calc non Af Amer: 33 mL/min — ABNORMAL LOW (ref >90)
Glucose, Bld: 705 mg/dL (ref 70–99)
Glucose, Bld: 447 mg/dL — ABNORMAL HIGH (ref 70–99)
Glucose, Bld: 169 mg/dL — ABNORMAL HIGH (ref 70–99)
Potassium: 4.4 mEq/L (ref 3.5–5.1)
Potassium: 3.8 mEq/L (ref 3.5–5.1)
Potassium: 4.3 mEq/L (ref 3.5–5.1)
Sodium: 131 mEq/L — ABNORMAL LOW (ref 135–145)
Sodium: 136 mEq/L (ref 135–145)
Sodium: 137 mEq/L (ref 135–145)

## 2013-04-15 LAB — HEMOGLOBIN A1C
Hgb A1c MFr Bld: 8.3 % — ABNORMAL HIGH (ref <5.7)
Mean Plasma Glucose: 192 mg/dL — ABNORMAL HIGH (ref <117)

## 2013-04-15 LAB — VITAMIN B12: Vitamin B-12: 345 pg/mL (ref 211–911)

## 2013-04-15 LAB — CBC
HCT: 33.5 % — ABNORMAL LOW (ref 36.0–46.0)
Hemoglobin: 10.5 g/dL — ABNORMAL LOW (ref 12.0–15.0)
MCH: 27.7 pg (ref 26.0–34.0)
MCHC: 31.3 g/dL (ref 30.0–36.0)
MCV: 88.4 fL (ref 78.0–100.0)
Platelets: 283 10*3/uL (ref 150–400)
RBC: 3.79 MIL/uL — ABNORMAL LOW (ref 3.87–5.11)
RDW: 15 % (ref 11.5–15.5)
WBC: 8.6 10*3/uL (ref 4.0–10.5)

## 2013-04-15 LAB — TSH: TSH: <0.008 u[IU]/mL — ABNORMAL LOW (ref 0.350–4.500)

## 2013-04-15 LAB — FERRITIN: Ferritin: 100 ng/mL (ref 10–291)

## 2013-04-15 LAB — IRON AND TIBC
Iron: <10 ug/dL — ABNORMAL LOW (ref 42–135)
UIBC: 253 ug/dL (ref 125–400)

## 2013-04-15 LAB — MRSA PCR SCREENING: MRSA by PCR: NEGATIVE

## 2013-04-15 LAB — TROPONIN I: Troponin I: <0.3 ng/mL (ref <0.30)

## 2013-04-15 LAB — FOLATE: Folate: 12.8 ng/mL

## 2013-04-15 MED ORDER — INSULIN ASPART 100 UNIT/ML ~~LOC~~ SOLN
0.0000 [IU] | Freq: Three times a day (TID) | SUBCUTANEOUS | Status: DC
  Administered 2013-04-15: 3 [IU] via SUBCUTANEOUS
  Administered 2013-04-16: 15 [IU] via SUBCUTANEOUS

## 2013-04-15 MED ORDER — FUROSEMIDE 10 MG/ML IJ SOLN
60.0000 mg | Freq: Three times a day (TID) | INTRAMUSCULAR | Status: DC
  Administered 2013-04-15 – 2013-04-18 (×11): 60 mg via INTRAVENOUS
  Filled 2013-04-15 (×11): qty 6

## 2013-04-15 MED ORDER — SODIUM CHLORIDE 0.9 % IV SOLN
INTRAVENOUS | Status: DC
Start: 2013-04-15 — End: 2013-04-21
  Administered 2013-04-15: 18:00:00 via INTRAVENOUS

## 2013-04-15 MED ORDER — INSULIN ASPART 100 UNIT/ML ~~LOC~~ SOLN
4.0000 [IU] | Freq: Three times a day (TID) | SUBCUTANEOUS | Status: DC
  Administered 2013-04-15 – 2013-04-17 (×5): 4 [IU] via SUBCUTANEOUS

## 2013-04-15 MED ORDER — NYSTATIN 100000 UNIT/GM EX POWD
Freq: Two times a day (BID) | CUTANEOUS | Status: DC
  Administered 2013-04-15 – 2013-04-21 (×10): via TOPICAL
  Filled 2013-04-15 (×3): qty 15

## 2013-04-15 MED ORDER — DEXTROSE-NACL 5-0.45 % IV SOLN: INTRAVENOUS | Status: DC

## 2013-04-15 MED ORDER — LEVOFLOXACIN 750 MG PO TABS
750.0000 mg | ORAL_TABLET | ORAL | Status: DC
Start: 2013-04-16 — End: 2013-04-16
  Administered 2013-04-16: 750 mg via ORAL
  Filled 2013-04-15: qty 1

## 2013-04-15 MED ORDER — INSULIN DETEMIR 100 UNIT/ML ~~LOC~~ SOLN
40.0000 [IU] | Freq: Every day | SUBCUTANEOUS | Status: DC
  Administered 2013-04-15 – 2013-04-16 (×2): 40 [IU] via SUBCUTANEOUS
  Filled 2013-04-15 (×4): qty 0.4

## 2013-04-15 NOTE — Progress Notes (Signed)
Patient seen and examined. Agree with the note as above.  Patient was transferred to the step down unit for management of her blood sugars requiring insulin infusion. Her anion gap this is close to her blood sugars have improved. She's been transitioned to subcutaneous insulin. Regarding her respiratory status, she does appear to be improving. She appears to have elements of both COPD exacerbation as well as congestive heart failure. I discussed the case with Dr. branch who wishes to continue with IV diuretics at this time. If she fails to improve with Lasix 60 mg 3 times a day, then consideration can be made for Lasix infusion. It does not appear that any acute intervention needs to be done regarding her T-wave inversions on EKG. We'll continue with steroids and nebulizers for COPD exacerbation. Continue with remainder of treatments. Anticipate discharge home in the next few days.        Stephanie Combs

## 2013-04-15 NOTE — Progress Notes (Signed)
Report called and given to Reesa Chew, RN. Patient being transferred to dept 300. Patient is alert, oriented and ins table condition at the time of transport.

## 2013-04-15 NOTE — Clinical Documentation Improvement (Signed)
Please clarify respiratory status  Patient admitted with COPD exacerbation, Acute on Chronic diastolic heart failure, and diabetes. Presented to ED with Shortness of breath and gradual and worsening of persistent cough and wheezing  X 1 week History of COPD, CHF, and Home O2 2.5L/m via Nasal canula   H&P :  COPD she is oxygen dependent at home,  Treatment  Proventil neb q4h and q2h prn Atrovent neb q4h O2 therapy Keep sats >92%-currently on 2.5L/m via [place]  Possible Clinical Conditions?  Acute Respiratory Failure Acute on Chronic Respiratory Failure Chronic Respiratory Failure Other Condition Cannot Clinically Determine   Thank You, Harless Litten ,RN Clinical Documentation Specialist:  [phone]  [facility]- Health Information Management

## 2013-04-15 NOTE — Consult Note (Signed)
CARDIOLOGY CONSULT NOTE   Patient ID: ORION VANDERVORT MRN: [id number] DOB/AGE: [birth year] 62 y.o.  Admit Date: 04/14/2013 Referring Physician: PTH Primary Physician: Toma Deiters, MD Consulting Cardiologist: Wyline Mood, MD Primary Cardiologist:New Reason for Consultation: CHF  Clinical Summary Ms. Formisano is a 62 y.o.female admitted with worsening shortness of breath and edema. She has a history of COPD, Diastolic Dysfunction (Grade II), diabetes, and hypertension. She was recently admitted to [facility] a couple of  weeks for two days, due to dyspnea and dizziness. She was released with continued edema. She has worsening coughing and weakness with ongoing dizziness and came to [facility] ER.She also began a non-productive cough which is now productive. She was scheduled to see cardiologist at [facility] as her husband and brother are followed there, but cancelled appts due to death of her husband.   In ER O2 sat 98% on 4/liters. Glucose elevated at 279. Creat 1.63. Na 133. Pro-BNP 14,124. CXR demonstrated caridomegaly, with worsened pulmonary congestion and interstitial edema. CHF. Seh was treated with lasix and steroids with inhalers. She has diuresed 925 cc. Repeat echo has been ordered. She is being treated with abx. Breathing status has not improved, but edema has. Lasix at 40 mg IV TID.      Allergies  Allergen Reactions  . Dilaudid [Hydromorphone Hcl]   . Oxycontin [Oxycodone]   . Reglan [Metoclopramide]   . Valium [Diazepam]   . Vistaril [Hydroxyzine Hcl]     Medications Scheduled Medications: . albuterol  2.5 mg Nebulization Q4H  . aspirin  325 mg Oral Daily  . diltiazem  240 mg Oral Daily  . enoxaparin (LOVENOX) injection  50 mg Subcutaneous Q24H  . furosemide  40 mg Intravenous TID  . gabapentin  600 mg Oral QID  . ipratropium  0.5 mg Nebulization Q4H  . levofloxacin  750 mg Oral Q24H  . levothyroxine  250 mcg Oral QAC breakfast  . meclizine  25 mg Oral BID  . potassium  chloride  20 mEq Oral BID  . predniSONE  40 mg Oral Q breakfast  . sodium chloride  3 mL Intravenous Q12H    Infusions: . dextrose 5 % and 0.45% NaCl    . insulin (NOVOLIN-R) infusion 2 Units/hr (04/15/13 0645)    PRN Medications: acetaminophen, acetaminophen, albuterol, alprazolam, alum & mag hydroxide-simeth, bisacodyl, dextrose, guaiFENesin-dextromethorphan, HYDROcodone-acetaminophen, morphine injection, ondansetron (ZOFRAN) IV, ondansetron, senna-docusate   Past Medical History  Diagnosis Date  . Hypertension   . Diabetes mellitus without complication   . COPD (chronic obstructive pulmonary disease)   . CHF (congestive heart failure)   . Anxiety   . Chronic back pain   . Hypothyroidism   . Pedal edema   . On home O2     2.5 L N/C prn    Past Surgical History  Procedure Laterality Date  . Tubal ligation    . Hernia repair      History reviewed. No pertinent family history.  Social History Ms. Whyte reports that she has been smoking Cigarettes.  She has been smoking about 0.25 packs per day. She does not have any smokeless tobacco history on file. Ms. Azizi reports that she does not drink alcohol.  Review of Systems Otherwise reviewed and negative except as outlined.  Physical Examination Blood pressure 132/60, pulse 81, temperature 98.1 F (36.7 C), temperature source Oral, resp. rate 28, height 5\' 5"  (1.651 m), weight 238 lb 5.1 oz (108.1 kg), SpO2 90.00%.  Intake/Output Summary (Last 24 hours) at  04/15/13 1253 Last data filed at 04/15/13 0645  Gross per 24 hour  Intake  149.6 ml  Output   1075 ml  Net -925.4 ml      HEENT: Conjunctiva and lids normal, oropharynx clear with moist mucosa. Neck: Supple, no elevated JVP or carotid bruits, no thyromegaly. Lungs: Bilateral rales, rhonchi and wheezing with productive coughing. Continues on O2. Poor respiratory effort. Cardiac: Regular rate and rhythm, tachycardic,  no pericardial rub. Abdomen: Soft,  nontender, no hepatomegaly, bowel sounds present, no guarding or rebound. Extremities: No pitting edema, distal pulses 2+. Skin: Warm and dry. Musculoskeletal: No kyphosis. Neuropsychiatric: Alert and oriented x3, affect grossly appropriate.  Prior Cardiac Testing/Procedures 1.Echo 08/09/2012 Left ventricle: The cavity size was normal. There was moderate focal basal hypertrophy. Systolic function was vigorous. The estimated ejection fraction was in the range of 65% to 70%. Although no diagnostic regional wall motion abnormality was identified, this possibility cannot be completely excluded on the basis of this study. Features are consistent with a pseudonormal left ventricular filling pattern, with concomitant abnormal relaxation and increased filling pressure (grade 2 diastolic dysfunction). Doppler parameters are consistent with elevated ventricular end-diastolic filling pressure. No previous study for comparison. - Aortic valve: Poorly visualized. Mildly calcified annulus. Mildly calcified leaflets. Transvalvular velocity was within the normal range. - Mitral valve: Calcified annulus. Mild regurgitation. - Left atrium: The atrium was mildly to moderately dilated. - Right ventricle: The cavity size was mildly dilated. Wall thickness was mildly increased. Systolic function was mildly reduced. - Right atrium: Central venous pressure: 15mm Hg (est). - Tricuspid valve: Mild regurgitation. - Pulmonary arteries: Systolic pressure was at least moderately increased. PA peak pressure: 60mm Hg (S). - Pericardium, extracardiac: There was no pericardial effusion.   Lab Results  Basic Metabolic Panel:  Recent Labs Lab 04/14/13 1315 04/14/13 1942 04/14/13 2225 04/15/13 0147 04/15/13 0413 04/15/13 0953  NA 133*  --  132* 131* 136 137  K 4.3  --  4.5 4.4 3.8 4.3  CL 96  --  92* 94* 97 99  CO2 26  --  25 25 26 28   GLUCOSE 279* 8* 773*  773* 705* 447* 169*  BUN 37*  --  42* 42*  42* 42*  CREATININE 1.63*  --  1.83* 1.66* 1.64* 1.61*  CALCIUM 9.3  --  8.9 8.7 9.2 9.3    CBC:  Recent Labs Lab 04/14/13 1315 04/14/13 2329 04/15/13 0413  WBC 11.6* 9.3 8.6  NEUTROABS 8.7*  --   --   HGB 11.6* 10.8* 10.5*  HCT 36.1 34.4* 33.5*  MCV 88.0 91.2 88.4  PLT 251 266 283    Cardiac Enzymes:  Recent Labs Lab 04/14/13 1315 04/14/13 1942 04/15/13 0413  TROPONINI <0.30 <0.30 <0.30     Radiology: Dg Chest Portable 1 View  04/14/2013   CLINICAL DATA:  Short of breath  EXAM: PORTABLE CHEST - 1 VIEW  COMPARISON:  Prior chest x-ray 04/05/2013  FINDINGS: The patient is markedly rotated toward the right. Given rotation and differences in technique, the heart appears slightly enlarged compared to prior. Additionally, there is increased pulmonary vascular congestion now with mild interstitial edema. No large pleural effusion identified. No pneumothorax. A skin fold projects over the left lung field. No acute osseous abnormality.  IMPRESSION: Slightly increased cardiomegaly, worsened pulmonary vascular congestion and mild interstitial edema consistent with mild-moderate CHF.   Electronically Signed   By: Malachy Moan M.D.   On: 04/14/2013 13:49     ECG: NSR  with anterior T-wave inversion   Impression and Recommendations:  1.Acute on Chronic Diastolic CHF: Very little urine output since admission with IV lasix. Edema improved. CXR demonstrated mild pulmonary edema. Continues with severe chest congestion. Most recent echo in March 2014, demonstrated EF of 65%-70% with grade 2 diastolic dysfunction.  Repeat echo has been ordered, but not completed presently. EKG demonstrates inferior T-wave inversion with flattening in the anterior leads. Cardiac enzymes argue against ACS. Will continue diureses, and await LV fx studies. She may benefit from transfer to [facility] to be treated over the weekend and then plan for R&L heart cath with EKG changes and COPD. Will discuss with Dr.  Wyline Mood.  2. COPD exacerbation: She appears to have more evidence of exacerbation presently event though Pro-BNP and CXR indicate CHF. Continue treatment for this per PTH. Repeat CXR.   3.Multiple CVRF: Hypertension, diabetes, obesity,FH and ongoing tobacco abuse. Check lipids. Await echo. She should be considered for further cardiac testing with CVRF. Should also be evaluated for OSA.   4.Diabetes: Per PTH. Hgb A1c 8.3 on admission. Elevated with use of steroids.   5. Hypertension: Currently controlled.       Signed: Bettey Mare. Lyman Bishop NP [facility] 04/15/2013, 12:53 PM Co-Sign MD  Attending Note Patient seen and discussed with NP Lyman Bishop, I agree with her documentation above. 62 yo female with history of COPD, grade II diastolic dysfunction, DM, and HTN admitted with SOB. She reports cold like symptoms a few days ago with sore throat and nasal congestion that progressed to cough that is somewhat productive. She denies noticing any significant LE edema, but has chronic orthopnea.She has been treated for both COPD exacerbation and acute on chronic diastolic heart failure. Previous echo 07/2012 showe LVEF 65-70%, grade II diastolic dysfunction and mildly decreased RV function. CXR was consistent with pulmonary edema, pro-BNP 14,124, troponin negative. EKG shows sinus rhythm with TWI anteroseptal leads V1-V4 which appear new. She is negative since starting lasix 40mg  tid. Repeat echo has been ordered and is pending. On exam significant expirtory wheezing, also has bilateral crackles, bilateral LE edema. Overall symptoms consistent with COPD exacerbation likely brought on by URI as well as acute on chronic diastolic heart failure. She reports she had been on torsemide but stopped working, and was recently switched back to lasix. Recommend increasing IV lasix to 60mg  tid, when convert to oral given her poor response to toresmide, would actually discharge her on oral bumex 1mg  daily  with instructions to take an extra if worsening SOB. I suspect that given her underlying renal dysfunction and likely poor oral absorption, lasix will not be enough for her at home. Will follow up results of repeat echo.   Dina Rich MD

## 2013-04-15 NOTE — Progress Notes (Signed)
CRITICAL VALUE ALERT  Critical value received:  Glucose=773  Date of notification:  04/14/13  Time of notification:  2311  Critical value read back:yes  Nurse who received alert:  Jinny Sanders, RN  MD notified (1st page):  Jimmye Norman, NP  Time of first page:  2320  MD notified (2nd page):  Time of second page:  Responding MD:  Jimmye Norman, NP notified of the above and new orders given.  Time MD responded:  2325

## 2013-04-15 NOTE — Progress Notes (Signed)
[facility] PROGRESS NOTE  Stephanie Combs UYQ:[id number] DOB: 03-31-1951 DOA: 04/14/2013 PCP: Toma Deiters, MD  Assessment/Plan: Acute on chronic diastolic HF (heart failure): The setting of COPD exacerbation. Trigger unclear. Patient reports compliance with her medication. Volume status - . Weight 108.1.  Will continue IV Lasix every 8 hours. Await echo cardiogram to assess LV function given EKG changes. Of note last echo done in March of this year which yielded an EF of 60% and grade 2 diastolic dysfunction. Await cardiology consult.   Active Problems:  COPD with exacerbation: Remains somewhat tight. Continue nebulizers every 4 hours and solumedrol withy levaquin day #2. Sats 91-93% on 3.5 L. Of note, pt oxygen at home range 2-3L.   Chest pain. Atypical. EKG with some T-wave inversions when compared to EKG done in March of this year. Troponin neg x 3. Repeat EKG when compared to yesterday change QRS axis and t wave changes in inferior leads. Continue oxygen morphine and Zofran as needed. Await cardiology consult   DM type 2 (diabetes mellitus, type 2): Events of last night noted. Gap 13 this am. Will follow glucomander protocol. Hemoglobin A1c 8.3. Will transition to home dose lantus and resistant SSI when indicated.    Hyponatremia: Resolved.    Leukocytosis: resolved.  Likely reactive.   CKD (chronic kidney disease), stage III: Chart review indicates history of chronic kidney disease. Current creatinine is 1.64 down from 1.66 on admission. Will continue to monitor closely given lasix.     HTN (hypertension), benign: Stable. medications include diltiazem  and Lasix and torsemide. Will continue the diltiazem  Lasix IV due to #1. Will hold Demadex   Anemia: Likely related to chronic disease. Anemia panel yields Iron <10 otherwise within the limits of normal. Consider supplementation   COPD (chronic obstructive pulmonary disease): See #2   Morbid obesity: BMI 38.5. Will  request nutritional consult     Code Status: full Family Communication: none present Disposition Plan: home when ready   Consultants:  cardiology  Procedures:  none  Antibiotics:  Levaquin: 04/14/13>>>  HPI/Subjective: Awake, alert. comfortable  Objective: Filed Vitals:   04/15/13 0730  BP:   Pulse:   Temp: 98.1 F (36.7 C)  Resp:     Intake/Output Summary (Last 24 hours) at 04/15/13 0922 Last data filed at 04/15/13 0645  Gross per 24 hour  Intake  149.6 ml  Output   1075 ml  Net -925.4 ml   Filed Weights   04/14/13 1252 04/15/13 0015 04/15/13 0500  Weight: 107.956 kg (238 lb) 107.8 kg (237 lb 10.5 oz) 108.1 kg (238 lb 5.1 oz)    Exam:   General:  Obese comfortable NAD  Cardiovascular: RRR no MGR 1+ LE edema on right trace LE edema on left  Respiratory: less increased work of breathing with conversation. BS remain diminished but improved. Diffuse rhonchi and coarseness  Abdomen: obese soft +BS non-tender to palpation  Musculoskeletal: no clubbing or cyanosis   Data Reviewed: Basic Metabolic Panel:  Recent Labs Lab 04/14/13 1315 04/14/13 1942 04/14/13 2225 04/15/13 0147 04/15/13 0413  NA 133*  --  132* 131* 136  K 4.3  --  4.5 4.4 3.8  CL 96  --  92* 94* 97  CO2 26  --  25 25 26   GLUCOSE 279* 8* 773*  773* 705* 447*  BUN 37*  --  42* 42* 42*  CREATININE 1.63*  --  1.83* 1.66* 1.64*  CALCIUM 9.3  --  8.9 8.7 9.2  Liver Function Tests: No results found for this basename: AST, ALT, ALKPHOS, BILITOT, PROT, ALBUMIN,  in the last 168 hours No results found for this basename: LIPASE, AMYLASE,  in the last 168 hours No results found for this basename: AMMONIA,  in the last 168 hours CBC:  Recent Labs Lab 04/14/13 1315 04/14/13 2329 04/15/13 0413  WBC 11.6* 9.3 8.6  NEUTROABS 8.7*  --   --   HGB 11.6* 10.8* 10.5*  HCT 36.1 34.4* 33.5*  MCV 88.0 91.2 88.4  PLT 251 266 283   Cardiac Enzymes:  Recent Labs Lab 04/14/13 1315  04/14/13 1942 04/15/13 0413  TROPONINI <0.30 <0.30 <0.30   BNP (last 3 results)  Recent Labs  04/14/13 1315  PROBNP 14124.0*   CBG:  Recent Labs Lab 04/15/13 0428 04/15/13 0535 04/15/13 0640 04/15/13 0744 04/15/13 0845  GLUCAP 392* 214* 125* 94 117*    Recent Results (from the past 240 hour(s))  MRSA PCR SCREENING     Status: None   Collection Time    04/15/13  1:00 AM      Result Value Range Status   MRSA by PCR NEGATIVE  NEGATIVE Final   Comment:            The GeneXpert MRSA Assay (FDA     approved for NASAL specimens     only), is one component of a     comprehensive MRSA colonization     surveillance program. It is not     intended to diagnose MRSA     infection nor to guide or     monitor treatment for     MRSA infections.     Studies: Dg Chest Portable 1 View  04/14/2013   CLINICAL DATA:  Short of breath  EXAM: PORTABLE CHEST - 1 VIEW  COMPARISON:  Prior chest x-ray 04/05/2013  FINDINGS: The patient is markedly rotated toward the right. Given rotation and differences in technique, the heart appears slightly enlarged compared to prior. Additionally, there is increased pulmonary vascular congestion now with mild interstitial edema. No large pleural effusion identified. No pneumothorax. A skin fold projects over the left lung field. No acute osseous abnormality.  IMPRESSION: Slightly increased cardiomegaly, worsened pulmonary vascular congestion and mild interstitial edema consistent with mild-moderate CHF.   Electronically Signed   By: Malachy Moan M.D.   On: 04/14/2013 13:49    Scheduled Meds: . albuterol  2.5 mg Nebulization Q4H  . aspirin  325 mg Oral Daily  . diltiazem  240 mg Oral Daily  . enoxaparin (LOVENOX) injection  50 mg Subcutaneous Q24H  . furosemide  40 mg Intravenous TID  . gabapentin  600 mg Oral QID  . ipratropium  0.5 mg Nebulization Q4H  . levofloxacin  750 mg Oral Q24H  . levothyroxine  250 mcg Oral QAC breakfast  . meclizine  25  mg Oral BID  . potassium chloride  20 mEq Oral BID  . predniSONE  40 mg Oral Q breakfast  . sodium chloride  3 mL Intravenous Q12H   Continuous Infusions: . dextrose 5 % and 0.45% NaCl    . insulin (NOVOLIN-R) infusion 2 Units/hr (04/15/13 0645)    Principal Problem:   Acute on chronic diastolic HF (heart failure) Active Problems:   COPD (chronic obstructive pulmonary disease)   Morbid obesity   DM type 2 (diabetes mellitus, type 2)   HTN (hypertension), benign   CKD (chronic kidney disease), stage III   COPD with exacerbation  Hyponatremia   Leukocytosis   Anemia   Chest pain   Dyspnea    Time spent: 30 minutes    [facility]  [facility] Pager [phone]. If 7PM-7AM, please contact night-coverage at [email or web address], password [facility] 04/15/2013, 9:22 AM  LOS: 1 day

## 2013-04-15 NOTE — Progress Notes (Signed)
UR chart review completed.  

## 2013-04-15 NOTE — Progress Notes (Signed)
CBG meter reading is "Hi" for HS CBG.  Order placed for stat lab verification and Jimmye Norman, NP notified via phone.  Nursing staff to continue to monitor.

## 2013-04-15 NOTE — Progress Notes (Signed)
Patient is currently on Levaquin for COPD exacerbation.  CKD noted.  Estimated CrCl 40-45 ml/min.  Have changed Levaquin to 750mg  sq 48h per manufacturer recommendations for CrCl 20-9ml/min.  If patient discharged on Levaquin, consider decreased dose of 750mg  q48h or 500mg  daily for renal impairment.  Thanks!  Junita Push, PharmD, BCPS 04/15/2013@12 :53 PM

## 2013-04-15 NOTE — Progress Notes (Signed)
Inpatient Diabetes Program Recommendations  AACE/ADA: New Consensus Statement on Inpatient Glycemic Control (2013)  Target Ranges:  Prepandial:   less than 140 mg/dL      Peak postprandial:   less than 180 mg/dL (1-2 hours)      Critically ill patients:  140 - 180 mg/dL   Results for SHAQUILLE, MURDY (MRN [id number]) as of 04/15/2013 11:22  Ref. Range 04/15/2013 03:32 04/15/2013 04:28 04/15/2013 05:35 04/15/2013 06:40 04/15/2013 07:44 04/15/2013 08:45 04/15/2013 09:42 04/15/2013 10:47  Glucose-Capillary Latest Range: 70-99 mg/dL 147 (H) 829 (H) 562 (H) 125 (H) 94 117 (H) 153 (H) 179 (H)   Inpatient Diabetes Program Recommendations Insulin - Basal: When ready to transition from IV to SQ insulin, please consider ordering Levemir 30 units Q24H (to start at time ready to transition and leave insulin drip going for 1-2 hours after Levemir given before stopping insulin drip) and adjusting accordingly. Correction (SSI): When ready to transition from IV to SQ insulin, please consider ordering Novolog moderate correction scale Q4H (if NPO) or ACHS (if diet ordered and patient able to tolerate diet). Insulin - Meal Coverage: Once patient is transitioned from IV to SQ insulin and diet is ordered may want to consider ordering Novolog 4 units TID with meals while on steroids if postprandial glucose consistently >180 mg/dl.  Note: Patient has a history of diabetes and takes Levemir 40 units daily, Novolog 20 units daily, and Glucovance 5-500 mg BID as an outpatient for diabetes management.  Currently, patient is ordered to receive Novolin R insulin drip via GlucoStabilizer for inpatient glycemic control.  Patient is receiving steroids which is contributing to hyperglycemia.  When patient is ready to transition from IV to SQ insulin, please consider ordering Levemir 30 units Q24H, moderate correction scale Q4H or ACHS, and when diet is ordered may want to consider ordering Novolog 4 units TID with meals if postprandial  glucose consistently greater than 180 mg/dl.  Will continue to follow.  Thanks, Orlando Penner, RN, MSN, CCRN Diabetes Coordinator Inpatient Diabetes Program [phone] (Team Pager) [phone] ([facility] office) [phone] [facility] office)

## 2013-04-15 NOTE — Progress Notes (Signed)
Nutrition Brief Note  Patient identified on the Malnutrition Screening Tool (MST) Report, yielding a score of 4.   Wt Readings from Last 15 Encounters:  04/15/13 238 lb 5.1 oz (108.1 kg)  08/09/12 294 lb 8.6 oz (133.6 kg)  02/25/12 238 lb (107.956 kg)   Chart reviewed. Pt admitted with SOB, CHF, and COPD exacerbation. Noted that pt was admitted at [facility] approximately 3 weeks ago for diuresis at [facility].  Wt hx reports a 19% wt loss x 8 months, which is clinically significant, however, baseline wt is 238#, which pt has returned to. Wt fluctuations likely due to fluid.   Body mass index is 39.66 kg/(m^2). Patient meets criteria for obesity, class II based on current BMI.   Current diet order is NPO, patient is consuming approximately n/a% of meals at this time. Labs and medications reviewed.   No nutrition interventions warranted at this time. If nutrition issues arise, please consult RD.   Ranay Ketter A. Mayford Knife, RD, LDN Pager: [phone]

## 2013-04-15 NOTE — Care Management Note (Addendum)
    Page 1 of 2   04/21/2013     11:27:10 AM   CARE MANAGEMENT NOTE 04/21/2013  Patient:  FLORIDE, HUTMACHER   Account Number:  [id number]  Date Initiated:  04/15/2013  Documentation initiated by:  Sharrie Rothman  Subjective/Objective Assessment:   Pt admitted from home with CHF and hyperglycemia. Pt lives alone and has neighbors who are very active in the care of the pt. Pt has home O2, walker, and w/c     Action/Plan:   Pt denies any HH needs.   Anticipated DC Date:  04/18/2013   Anticipated DC Plan:  HOME/SELF CARE      DC Planning Services  CM consult      Choice offered to / List presented to:     DME arranged  NEBULIZER MACHINE      DME agency  [facility]     [facility] arranged  HH-1 RN  HH-10 DISEASE MANAGEMENT  HH-4 NURSE'S AIDE      HH agency  [facility]   Status of service:  Completed, signed off Medicare Important Message given?  YES (If response is "NO", the following Medicare IM given date fields will be blank) Date Medicare IM given:  04/21/2013 Date Additional Medicare IM given:    Discharge Disposition:  HOME W HOME HEALTH SERVICES  Per UR Regulation:    If discussed at Long Length of Stay Meetings, dates discussed:   04/19/2013  04/21/2013    Comments:  04/20/13 Rosemary Holms RN BSN CM Pt requesting HH RN/Aide from [facility].  04/15/13 1445 Arlyss Queen, RN BSN CM

## 2013-04-15 NOTE — Progress Notes (Signed)
*  PRELIMINARY RESULTS* Echocardiogram 2D Echocardiogram has been performed.  Stephanie Combs 04/15/2013, 5:54 PM

## 2013-04-16 DIAGNOSIS — J441 Chronic obstructive pulmonary disease with (acute) exacerbation: Secondary | ICD-10-CM

## 2013-04-16 LAB — GLUCOSE, RANDOM: Glucose, Bld: 440 mg/dL — ABNORMAL HIGH (ref 70–99)

## 2013-04-16 LAB — GLUCOSE, CAPILLARY
Glucose-Capillary: 357 mg/dL — ABNORMAL HIGH (ref 70–99)
Glucose-Capillary: 417 mg/dL — ABNORMAL HIGH (ref 70–99)
Glucose-Capillary: 269 mg/dL — ABNORMAL HIGH (ref 70–99)

## 2013-04-16 LAB — BASIC METABOLIC PANEL
BUN: 43 mg/dL — ABNORMAL HIGH (ref 6–23)
CO2: 30 mEq/L (ref 19–32)
Calcium: 8.8 mg/dL (ref 8.4–10.5)
Chloride: 94 mEq/L — ABNORMAL LOW (ref 96–112)
Creatinine, Ser: 1.38 mg/dL — ABNORMAL HIGH (ref 0.50–1.10)
GFR calc Af Amer: 46 mL/min — ABNORMAL LOW (ref >90)
GFR calc non Af Amer: 40 mL/min — ABNORMAL LOW (ref >90)
Glucose, Bld: 388 mg/dL — ABNORMAL HIGH (ref 70–99)
Potassium: 4.6 mEq/L (ref 3.5–5.1)
Sodium: 136 mEq/L (ref 135–145)

## 2013-04-16 MED ORDER — INSULIN ASPART 100 UNIT/ML ~~LOC~~ SOLN
21.0000 [IU] | Freq: Once | SUBCUTANEOUS | Status: AC
  Administered 2013-04-16: 21 [IU] via SUBCUTANEOUS

## 2013-04-16 MED ORDER — ALPRAZOLAM 1 MG PO TABS
1.0000 mg | ORAL_TABLET | Freq: Four times a day (QID) | ORAL | Status: DC | PRN
  Administered 2013-04-16: 1 mg via ORAL
  Administered 2013-04-16: 2 mg via ORAL
  Filled 2013-04-16: qty 2
  Filled 2013-04-16: qty 1

## 2013-04-16 MED ORDER — POLYETHYLENE GLYCOL 3350 17 G PO PACK
17.0000 g | PACK | Freq: Every day | ORAL | Status: DC
  Administered 2013-04-16 – 2013-04-21 (×6): 17 g via ORAL
  Filled 2013-04-16 (×7): qty 1

## 2013-04-16 MED ORDER — PANTOPRAZOLE SODIUM 40 MG PO TBEC
40.0000 mg | DELAYED_RELEASE_TABLET | Freq: Two times a day (BID) | ORAL | Status: DC
  Administered 2013-04-16 – 2013-04-21 (×11): 40 mg via ORAL
  Filled 2013-04-16 (×11): qty 1

## 2013-04-16 MED ORDER — INSULIN DETEMIR 100 UNIT/ML ~~LOC~~ SOLN
50.0000 [IU] | Freq: Every day | SUBCUTANEOUS | Status: DC
  Administered 2013-04-17 – 2013-04-18 (×2): 50 [IU] via SUBCUTANEOUS
  Filled 2013-04-16 (×4): qty 0.5

## 2013-04-16 MED ORDER — FLEET ENEMA 7-19 GM/118ML RE ENEM
1.0000 | ENEMA | Freq: Once | RECTAL | Status: AC
  Administered 2013-04-16: 1 via RECTAL

## 2013-04-16 MED ORDER — INSULIN DETEMIR 100 UNIT/ML ~~LOC~~ SOLN
10.0000 [IU] | Freq: Once | SUBCUTANEOUS | Status: AC
  Administered 2013-04-16: 10 [IU] via SUBCUTANEOUS
  Filled 2013-04-16: qty 0.1

## 2013-04-16 MED ORDER — LEVOFLOXACIN 750 MG PO TABS
750.0000 mg | ORAL_TABLET | Freq: Every day | ORAL | Status: DC
  Administered 2013-04-17 – 2013-04-21 (×5): 750 mg via ORAL
  Filled 2013-04-16 (×5): qty 1

## 2013-04-16 MED ORDER — INSULIN ASPART 100 UNIT/ML ~~LOC~~ SOLN
0.0000 [IU] | Freq: Every day | SUBCUTANEOUS | Status: DC
  Administered 2013-04-16: 3 [IU] via SUBCUTANEOUS
  Administered 2013-04-17 – 2013-04-18 (×2): 4 [IU] via SUBCUTANEOUS
  Administered 2013-04-19: 23:00:00 via SUBCUTANEOUS
  Administered 2013-04-20: 2 [IU] via SUBCUTANEOUS

## 2013-04-16 MED ORDER — INSULIN ASPART 100 UNIT/ML ~~LOC~~ SOLN
0.0000 [IU] | Freq: Three times a day (TID) | SUBCUTANEOUS | Status: DC
  Administered 2013-04-16: 20 [IU] via SUBCUTANEOUS
  Administered 2013-04-17: 4 [IU] via SUBCUTANEOUS
  Administered 2013-04-17: 15 [IU] via SUBCUTANEOUS
  Administered 2013-04-17 – 2013-04-18 (×3): 11 [IU] via SUBCUTANEOUS
  Administered 2013-04-18: 7 [IU] via SUBCUTANEOUS
  Administered 2013-04-19 (×2): 11 [IU] via SUBCUTANEOUS
  Administered 2013-04-19 – 2013-04-20 (×2): 4 [IU] via SUBCUTANEOUS
  Administered 2013-04-20: 7 [IU] via SUBCUTANEOUS
  Administered 2013-04-20: 11 [IU] via SUBCUTANEOUS
  Administered 2013-04-21: 20 [IU] via SUBCUTANEOUS

## 2013-04-16 MED ORDER — MENTHOL 3 MG MT LOZG
1.0000 | LOZENGE | OROMUCOSAL | Status: DC | PRN
  Administered 2013-04-17: 3 mg via ORAL
  Filled 2013-04-16 (×2): qty 9

## 2013-04-16 MED ORDER — INSULIN DETEMIR 100 UNIT/ML ~~LOC~~ SOLN
50.0000 [IU] | Freq: Every day | SUBCUTANEOUS | Status: DC
  Filled 2013-04-16 (×2): qty 0.5

## 2013-04-16 NOTE — Progress Notes (Signed)
[facility] PROGRESS NOTE  Stephanie Combs OZH:[id number] DOB: June 03, 1950 DOA: 04/14/2013 PCP: Toma Deiters, MD  Assessment/Plan: Acute on chronic diastolic HF (heart failure): The setting of COPD exacerbation. Trigger unclear. Patient reports compliance with her medication. Volume status -3.1L. Will continue IV Lasix every 8 hours. Echo cardiogram shows preserved EF with diastolic dysfunction, which is similar to last echo.  COPD with exacerbation: improving with current treatment Continue nebulizers every 4 hours and solumedrol with levaquin day #3. Of note, pt oxygen at home range 2-3L. If continues to improve, can likely start steroid taper tomorrow.  Acute on chronic respiratory failure, related to #1 and #2.  Continue current treatments.  Chest pain. Atypical. EKG with some T-wave inversions when compared to EKG done in March of this year. Troponin neg x 3. Repeat EKG when compared to yesterday change QRS axis and t wave changes in inferior leads. Continue oxygen morphine and Zofran as needed. Discussed with cardiology and it appears that she does not need any ischemic work up at this time.   DM type 2 (diabetes mellitus, type 2): blood sugars are running higher due to steroids, will increase levemir and place on resistant sliding scale coverage novolog   Hyponatremia: Resolved.    Leukocytosis: resolved.  Likely reactive.   CKD (chronic kidney disease), stage III: Chart review indicates history of chronic kidney disease. Creatinine improving with diuresis.     HTN (hypertension), benign: Stable.   Anemia, Iron deficiency.  Will start on iron supplementation as an outpatient. She will need cancer screenings to be performed as an outpatient   Morbid obesity: BMI 38.5. Will request nutritional consult     Code Status: full Family Communication: none present Disposition Plan: home when ready   Consultants:  cardiology  Procedures:  none  Antibiotics:  Levaquin:  04/14/13>>>  HPI/Subjective: Feels like breathing is improving, complaining of constipation  Objective: Filed Vitals:   04/16/13 0513  BP: 104/64  Pulse: 86  Temp: 98.2 F (36.8 C)  Resp: 20    Intake/Output Summary (Last 24 hours) at 04/16/13 1344 Last data filed at 04/16/13 0848  Gross per 24 hour  Intake  149.5 ml  Output   2400 ml  Net -2250.5 ml   Filed Weights   04/15/13 0015 04/15/13 0500 04/16/13 0453  Weight: 107.8 kg (237 lb 10.5 oz) 108.1 kg (238 lb 5.1 oz) 107.2 kg (236 lb 5.3 oz)    Exam:   General:  Obese comfortable NAD  Cardiovascular: RRR no MGR 1+ LE edema on right trace LE edema on left  Respiratory:  Diffuse rhonchi and coarseness, improving  Abdomen: obese soft +BS non-tender to palpation  Musculoskeletal: no clubbing or cyanosis   Data Reviewed: Basic Metabolic Panel:  Recent Labs Lab 04/14/13 2225 04/15/13 0147 04/15/13 0413 04/15/13 0953 04/16/13 0714 04/16/13 1151  NA 132* 131* 136 137 136  --   K 4.5 4.4 3.8 4.3 4.6  --   CL 92* 94* 97 99 94*  --   CO2 25 25 26 28 30   --   GLUCOSE 773*  773* 705* 447* 169* 388* 440*  BUN 42* 42* 42* 42* 43*  --   CREATININE 1.83* 1.66* 1.64* 1.61* 1.38*  --   CALCIUM 8.9 8.7 9.2 9.3 8.8  --    Liver Function Tests: No results found for this basename: AST, ALT, ALKPHOS, BILITOT, PROT, ALBUMIN,  in the last 168 hours No results found for this basename: LIPASE, AMYLASE,  in the  last 168 hours No results found for this basename: AMMONIA,  in the last 168 hours CBC:  Recent Labs Lab 04/14/13 1315 04/14/13 2329 04/15/13 0413  WBC 11.6* 9.3 8.6  NEUTROABS 8.7*  --   --   HGB 11.6* 10.8* 10.5*  HCT 36.1 34.4* 33.5*  MCV 88.0 91.2 88.4  PLT 251 266 283   Cardiac Enzymes:  Recent Labs Lab 04/14/13 1315 04/14/13 1942 04/15/13 0413  TROPONINI <0.30 <0.30 <0.30   BNP (last 3 results)  Recent Labs  04/14/13 1315  PROBNP 14124.0*   CBG:  Recent Labs Lab 04/15/13 1457  04/15/13 1719 04/15/13 2100 04/16/13 0753 04/16/13 1136  GLUCAP 175* 184* 356* 357* 417*    Recent Results (from the past 240 hour(s))  MRSA PCR SCREENING     Status: None   Collection Time    04/15/13  1:00 AM      Result Value Range Status   MRSA by PCR NEGATIVE  NEGATIVE Final   Comment:            The GeneXpert MRSA Assay (FDA     approved for NASAL specimens     only), is one component of a     comprehensive MRSA colonization     surveillance program. It is not     intended to diagnose MRSA     infection nor to guide or     monitor treatment for     MRSA infections.     Studies: Dg Chest 2 View  04/15/2013   CLINICAL DATA:  CHF/ COPD  EXAM: CHEST  2 VIEW  COMPARISON:  04/14/2013  FINDINGS: COPD. Negative for heart failure. Improved aeration of the lung bases compared with the prior study. Negative for edema or effusion.  IMPRESSION: Overall improved aeration from the prior study. No acute cardiopulmonary abnormality.   Electronically Signed   By: Marlan Palau M.D.   On: 04/15/2013 14:46    Scheduled Meds: . albuterol  2.5 mg Nebulization Q4H  . aspirin  325 mg Oral Daily  . diltiazem  240 mg Oral Daily  . enoxaparin (LOVENOX) injection  50 mg Subcutaneous Q24H  . furosemide  60 mg Intravenous TID  . gabapentin  600 mg Oral QID  . insulin aspart  0-15 Units Subcutaneous TID WC  . insulin aspart  4 Units Subcutaneous TID WC  . insulin detemir  10 Units Subcutaneous Once  . insulin detemir  50 Units Subcutaneous QHS  . ipratropium  0.5 mg Nebulization Q4H  . [START ON 04/17/2013] levofloxacin  750 mg Oral Daily  . levothyroxine  250 mcg Oral QAC breakfast  . meclizine  25 mg Oral BID  . nystatin   Topical BID  . pantoprazole  40 mg Oral BID  . polyethylene glycol  17 g Oral Daily  . potassium chloride  20 mEq Oral BID  . predniSONE  40 mg Oral Q breakfast  . sodium chloride  3 mL Intravenous Q12H   Continuous Infusions: . sodium chloride 10 mL/hr at 04/15/13  1751    Principal Problem:   Acute on chronic diastolic HF (heart failure) Active Problems:   COPD (chronic obstructive pulmonary disease)   Morbid obesity   DM type 2 (diabetes mellitus, type 2)   HTN (hypertension), benign   CKD (chronic kidney disease), stage III   COPD with exacerbation   Hyponatremia   Leukocytosis   Anemia   Chest pain   Dyspnea   Acute on chronic diastolic  congestive heart failure   Acute-on-chronic respiratory failure    Time spent: 30 minutes    Kiptyn Rafuse  [facility] Pager [phone]. If 7PM-7AM, please contact night-coverage at [email or web address], password [facility] 04/16/2013, 1:44 PM  LOS: 2 days

## 2013-04-16 NOTE — Progress Notes (Signed)
Pt requesting diflucan or something po for yeast. She does have active yeast infection in abdominal folds. Pt states that she has these frequently and that po meds seem to work well. I notified night time hospitalist coverage, Dr. Rito Ehrlich. No orders received at this time. [name]

## 2013-04-17 LAB — GLUCOSE, CAPILLARY
Glucose-Capillary: 372 mg/dL — ABNORMAL HIGH (ref 70–99)
Glucose-Capillary: 184 mg/dL — ABNORMAL HIGH (ref 70–99)
Glucose-Capillary: 300 mg/dL — ABNORMAL HIGH (ref 70–99)
Glucose-Capillary: 344 mg/dL — ABNORMAL HIGH (ref 70–99)
Glucose-Capillary: 343 mg/dL — ABNORMAL HIGH (ref 70–99)

## 2013-04-17 LAB — BASIC METABOLIC PANEL
BUN: 42 mg/dL — ABNORMAL HIGH (ref 6–23)
CO2: 34 mEq/L — ABNORMAL HIGH (ref 19–32)
Calcium: 8.8 mg/dL (ref 8.4–10.5)
Chloride: 99 mEq/L (ref 96–112)
Creatinine, Ser: 1.27 mg/dL — ABNORMAL HIGH (ref 0.50–1.10)
GFR calc Af Amer: 51 mL/min — ABNORMAL LOW (ref >90)
GFR calc non Af Amer: 44 mL/min — ABNORMAL LOW (ref >90)
Glucose, Bld: 227 mg/dL — ABNORMAL HIGH (ref 70–99)
Potassium: 4.5 mEq/L (ref 3.5–5.1)
Sodium: 140 mEq/L (ref 135–145)

## 2013-04-17 MED ORDER — INSULIN ASPART 100 UNIT/ML ~~LOC~~ SOLN
8.0000 [IU] | Freq: Three times a day (TID) | SUBCUTANEOUS | Status: DC
  Administered 2013-04-17 – 2013-04-18 (×5): 8 [IU] via SUBCUTANEOUS

## 2013-04-17 MED ORDER — PREDNISONE 20 MG PO TABS
30.0000 mg | ORAL_TABLET | Freq: Every day | ORAL | Status: DC
  Administered 2013-04-18 – 2013-04-20 (×3): 30 mg via ORAL
  Filled 2013-04-17 (×6): qty 1

## 2013-04-17 MED ORDER — ALPRAZOLAM 1 MG PO TABS
1.0000 mg | ORAL_TABLET | Freq: Four times a day (QID) | ORAL | Status: DC | PRN
Start: 2013-04-17 — End: 2013-04-21
  Administered 2013-04-17 – 2013-04-21 (×11): 1 mg via ORAL
  Filled 2013-04-17 (×11): qty 1

## 2013-04-17 NOTE — Progress Notes (Signed)
[facility] PROGRESS NOTE  Stephanie Combs ZOX:[id number] DOB: [birth year] DOA: 04/14/2013 PCP: Toma Deiters, MD  Assessment/Plan: Acute on chronic diastolic HF (heart failure): The setting of COPD exacerbation. Trigger unclear. Patient reports compliance with her medication. Volume status -6.9L. Will continue IV Lasix every 8 hours. Echo cardiogram shows preserved EF with diastolic dysfunction, which is similar to last echo. She is having good diuresis at this time.  Continue current treatments.  COPD with exacerbation: improving with current treatment Continue nebulizers every 4 hours and steroids with levaquin day #4. Of note, pt oxygen at home range 2-3L. Start prednisone taper  Acute on chronic respiratory failure, related to #1 and #2.  Continue current treatments.  Chest pain. Atypical. EKG with some T-wave inversions when compared to EKG done in March of this year. Troponin neg x 3. Repeat EKG when compared to yesterday change QRS axis and t wave changes in inferior leads. Continue oxygen morphine and Zofran as needed. Discussed with cardiology and it appears that she does not need any ischemic work up at this time.   DM type 2 (diabetes mellitus, type 2): increased meal coverage novolog  Hyponatremia: Resolved.    Leukocytosis: resolved.  Likely reactive.   CKD (chronic kidney disease), stage III: Chart review indicates history of chronic kidney disease. Creatinine improving with diuresis.     HTN (hypertension), benign: Stable.   Anemia, Iron deficiency.  Will start on iron supplementation as an outpatient. She will need cancer screenings to be performed as an outpatient   Morbid obesity: BMI 38.5. Will request nutritional consult     Code Status: full Family Communication: none present Disposition Plan: home when ready   Consultants:  cardiology  Procedures:  none  Antibiotics:  Levaquin: 04/14/13>>>  HPI/Subjective: Patient feels sleepy today.  Feels  breathing is slowly improving.  Objective: Filed Vitals:   04/17/13 0843  BP:   Pulse: 90  Temp:   Resp: 18    Intake/Output Summary (Last 24 hours) at 04/17/13 1308 Last data filed at 04/17/13 0500  Gross per 24 hour  Intake      0 ml  Output   3800 ml  Net  -3800 ml   Filed Weights   04/15/13 0500 04/16/13 0453 04/17/13 0620  Weight: 108.1 kg (238 lb 5.1 oz) 107.2 kg (236 lb 5.3 oz) 115.667 kg (255 lb)    Exam:   General:  Obese comfortable NAD, drowsy  Cardiovascular: RRR no MGR 1+ LE edema on right trace LE edema on left  Respiratory:  Diffuse rhonchi and coarseness, improving  Abdomen: obese soft +BS non-tender to palpation  Musculoskeletal: no clubbing or cyanosis   Data Reviewed: Basic Metabolic Panel:  Recent Labs Lab 04/15/13 0147 04/15/13 0413 04/15/13 0953 04/16/13 0714 04/16/13 1151 04/17/13 0552  NA 131* 136 137 136  --  140  K 4.4 3.8 4.3 4.6  --  4.5  CL 94* 97 99 94*  --  99  CO2 25 26 28 30   --  34*  GLUCOSE 705* 447* 169* 388* 440* 227*  BUN 42* 42* 42* 43*  --  42*  CREATININE 1.66* 1.64* 1.61* 1.38*  --  1.27*  CALCIUM 8.7 9.2 9.3 8.8  --  8.8   Liver Function Tests: No results found for this basename: AST, ALT, ALKPHOS, BILITOT, PROT, ALBUMIN,  in the last 168 hours No results found for this basename: LIPASE, AMYLASE,  in the last 168 hours No results found for this basename: AMMONIA,  in the last 168 hours CBC:  Recent Labs Lab 04/14/13 1315 04/14/13 2329 04/15/13 0413  WBC 11.6* 9.3 8.6  NEUTROABS 8.7*  --   --   HGB 11.6* 10.8* 10.5*  HCT 36.1 34.4* 33.5*  MCV 88.0 91.2 88.4  PLT 251 266 283   Cardiac Enzymes:  Recent Labs Lab 04/14/13 1315 04/14/13 1942 04/15/13 0413  TROPONINI <0.30 <0.30 <0.30   BNP (last 3 results)  Recent Labs  04/14/13 1315  PROBNP 14124.0*   CBG:  Recent Labs Lab 04/16/13 0753 04/16/13 1136 04/16/13 2106 04/17/13 0736 04/17/13 1146  GLUCAP 357* 417* 269* 184* 300*     Recent Results (from the past 240 hour(s))  MRSA PCR SCREENING     Status: None   Collection Time    04/15/13  1:00 AM      Result Value Range Status   MRSA by PCR NEGATIVE  NEGATIVE Final   Comment:            The GeneXpert MRSA Assay (FDA     approved for NASAL specimens     only), is one component of a     comprehensive MRSA colonization     surveillance program. It is not     intended to diagnose MRSA     infection nor to guide or     monitor treatment for     MRSA infections.     Studies: Dg Chest 2 View  04/15/2013   CLINICAL DATA:  CHF/ COPD  EXAM: CHEST  2 VIEW  COMPARISON:  04/14/2013  FINDINGS: COPD. Negative for heart failure. Improved aeration of the lung bases compared with the prior study. Negative for edema or effusion.  IMPRESSION: Overall improved aeration from the prior study. No acute cardiopulmonary abnormality.   Electronically Signed   By: Marlan Palau M.D.   On: 04/15/2013 14:46    Scheduled Meds: . albuterol  2.5 mg Nebulization Q4H  . aspirin  325 mg Oral Daily  . diltiazem  240 mg Oral Daily  . enoxaparin (LOVENOX) injection  50 mg Subcutaneous Q24H  . furosemide  60 mg Intravenous TID  . gabapentin  600 mg Oral QID  . insulin aspart  0-20 Units Subcutaneous TID WC  . insulin aspart  0-5 Units Subcutaneous QHS  . insulin aspart  8 Units Subcutaneous TID WC  . insulin detemir  50 Units Subcutaneous Daily  . ipratropium  0.5 mg Nebulization Q4H  . levofloxacin  750 mg Oral Daily  . levothyroxine  250 mcg Oral QAC breakfast  . meclizine  25 mg Oral BID  . nystatin   Topical BID  . pantoprazole  40 mg Oral BID  . polyethylene glycol  17 g Oral Daily  . potassium chloride  20 mEq Oral BID  . predniSONE  40 mg Oral Q breakfast  . sodium chloride  3 mL Intravenous Q12H   Continuous Infusions: . sodium chloride 10 mL/hr at 04/15/13 1751    Principal Problem:   Acute on chronic diastolic HF (heart failure) Active Problems:   COPD (chronic  obstructive pulmonary disease)   Morbid obesity   DM type 2 (diabetes mellitus, type 2)   HTN (hypertension), benign   CKD (chronic kidney disease), stage III   COPD with exacerbation   Hyponatremia   Leukocytosis   Anemia   Chest pain   Dyspnea   Acute on chronic diastolic congestive heart failure   Acute-on-chronic respiratory failure    Time spent: 30  minutes    Brena Windsor  [facility] Pager [phone]. If 7PM-7AM, please contact night-coverage at [email or web address], password [facility] 04/17/2013, 1:08 PM  LOS: 3 days

## 2013-04-17 NOTE — Progress Notes (Signed)
Pt given SSE per rectum. Large amount of formed stool returned.

## 2013-04-18 DIAGNOSIS — N179 Acute kidney failure, unspecified: Secondary | ICD-10-CM

## 2013-04-18 LAB — BASIC METABOLIC PANEL
BUN: 33 mg/dL — ABNORMAL HIGH (ref 6–23)
CO2: 39 mEq/L — ABNORMAL HIGH (ref 19–32)
Calcium: 9 mg/dL (ref 8.4–10.5)
Chloride: 91 mEq/L — ABNORMAL LOW (ref 96–112)
Creatinine, Ser: 1.03 mg/dL (ref 0.50–1.10)
GFR calc Af Amer: 66 mL/min — ABNORMAL LOW (ref >90)
GFR calc non Af Amer: 57 mL/min — ABNORMAL LOW (ref >90)
Glucose, Bld: 303 mg/dL — ABNORMAL HIGH (ref 70–99)
Potassium: 4.3 mEq/L (ref 3.5–5.1)
Sodium: 137 mEq/L (ref 135–145)

## 2013-04-18 LAB — GLUCOSE, CAPILLARY
Glucose-Capillary: 340 mg/dL — ABNORMAL HIGH (ref 70–99)
Glucose-Capillary: 245 mg/dL — ABNORMAL HIGH (ref 70–99)
Glucose-Capillary: 278 mg/dL — ABNORMAL HIGH (ref 70–99)
Glucose-Capillary: 276 mg/dL — ABNORMAL HIGH (ref 70–99)
Glucose-Capillary: 352 mg/dL — ABNORMAL HIGH (ref 70–99)

## 2013-04-18 NOTE — Progress Notes (Signed)
[facility] PROGRESS NOTE  Stephanie Combs ZOX:[id number] DOB: [birth year] DOA: 04/14/2013 PCP: Toma Deiters, MD  Assessment/Plan: Acute on chronic diastolic HF (heart failure): The setting of COPD exacerbation. Volume status -12L. Wt. 110.9kg from 115.6kg.  Will defer IV lasix management to cardiology. Appreciate the assistance.  Echo cardiogram shows preserved EF with diastolic dysfunction, which is similar to last echo. Continue current treatments.   COPD with exacerbation: Continues with coarse BS and wheeze.  Continue nebulizers every 4 hours and steroid taper. Levaquin dose #4. Will discontinue after 5 doses. Of note, pt oxygen at home range 2-3L. Start prednisone taper   Acute on chronic respiratory failure, related to #1 and #2. Improved. Continue current treatments.   Chest pain. Atypical. EKG with some T-wave inversions when compared to EKG done in March of this year. Troponin neg x 3. Repeat EKG when yields change QRS axis and t wave changes in inferior leads. Discussed with cardiology and it appears that she does not need any ischemic work up at this time.   DM type 2 (diabetes mellitus, type 2): SSI. Anticipate glucose becoming more stable with steroid taper.   Hyponatremia: Resolved.   Leukocytosis: resolved. Likely reactive.   CKD (chronic kidney disease), stage III: Chart review indicates history of chronic kidney disease. Creatinine improved with diuresis.   HTN (hypertension), benign: Stable. SBP range 121-146. Continue diltiazem. Transition lasix to po per cards.   Anemia, Iron deficiency. Will start on iron supplementation as an outpatient. She will need cancer screenings to be performed as an outpatient   Morbid obesity: BMI 38.5. Will request nutritional consult    Code Status: full Family Communication: none present Disposition Plan: home when ready. 24-48 hours hopefull   Consultants:  Cardiology  Procedures:  none  Antibiotics:  Levaquin  04/13/13-04/18/13  HPI/Subjective: Lying in bed eyes closed. Easily aroused. Denies pain/discomfort. Requesting SSE for constipation  Objective: Filed Vitals:   04/18/13 0540  BP: 146/53  Pulse: 80  Temp: 98 F (36.7 C)  Resp: 18    Intake/Output Summary (Last 24 hours) at 04/18/13 0844 Last data filed at 04/18/13 0600  Gross per 24 hour  Intake    932 ml  Output   6700 ml  Net  -5768 ml   Filed Weights   04/16/13 0453 04/17/13 0620 04/18/13 0540  Weight: 107.2 kg (236 lb 5.3 oz) 115.667 kg (255 lb) 110.995 kg (244 lb 11.2 oz)    Exam:   General:  Obese NAD  Cardiovascular: RRR No m/g/r. Trace LE edema   Respiratory: mild increase work of breathing with conversation. BS diffusely coarse and anterior and exterior wheeze  Abdomen: obese soft +BS  throughout  Musculoskeletal: no joint swelling/erythema no clubbing   Data Reviewed: Basic Metabolic Panel:  Recent Labs Lab 04/15/13 0413 04/15/13 0953 04/16/13 0714 04/16/13 1151 04/17/13 0552 04/18/13 0634  NA 136 137 136  --  140 137  K 3.8 4.3 4.6  --  4.5 4.3  CL 97 99 94*  --  99 91*  CO2 26 28 30   --  34* 39*  GLUCOSE 447* 169* 388* 440* 227* 303*  BUN 42* 42* 43*  --  42* 33*  CREATININE 1.64* 1.61* 1.38*  --  1.27* 1.03  CALCIUM 9.2 9.3 8.8  --  8.8 9.0   Liver Function Tests: No results found for this basename: AST, ALT, ALKPHOS, BILITOT, PROT, ALBUMIN,  in the last 168 hours No results found for this basename: LIPASE, AMYLASE,  in the last 168 hours No results found for this basename: AMMONIA,  in the last 168 hours CBC:  Recent Labs Lab 04/14/13 1315 04/14/13 2329 04/15/13 0413  WBC 11.6* 9.3 8.6  NEUTROABS 8.7*  --   --   HGB 11.6* 10.8* 10.5*  HCT 36.1 34.4* 33.5*  MCV 88.0 91.2 88.4  PLT 251 266 283   Cardiac Enzymes:  Recent Labs Lab 04/14/13 1315 04/14/13 1942 04/15/13 0413  TROPONINI <0.30 <0.30 <0.30   BNP (last 3 results)  Recent Labs  04/14/13 1315  PROBNP 14124.0*    CBG:  Recent Labs Lab 04/17/13 1146 04/17/13 1659 04/17/13 2210 04/18/13 0226 04/18/13 0800  GLUCAP 300* 344* 343* 340* 245*    Recent Results (from the past 240 hour(s))  MRSA PCR SCREENING     Status: None   Collection Time    04/15/13  1:00 AM      Result Value Range Status   MRSA by PCR NEGATIVE  NEGATIVE Final   Comment:            The GeneXpert MRSA Assay (FDA     approved for NASAL specimens     only), is one component of a     comprehensive MRSA colonization     surveillance program. It is not     intended to diagnose MRSA     infection nor to guide or     monitor treatment for     MRSA infections.     Studies: No results found.  Scheduled Meds: . albuterol  2.5 mg Nebulization Q4H  . aspirin  325 mg Oral Daily  . diltiazem  240 mg Oral Daily  . enoxaparin (LOVENOX) injection  50 mg Subcutaneous Q24H  . furosemide  60 mg Intravenous TID  . gabapentin  600 mg Oral QID  . insulin aspart  0-20 Units Subcutaneous TID WC  . insulin aspart  0-5 Units Subcutaneous QHS  . insulin aspart  8 Units Subcutaneous TID WC  . insulin detemir  50 Units Subcutaneous Daily  . ipratropium  0.5 mg Nebulization Q4H  . levofloxacin  750 mg Oral Daily  . levothyroxine  250 mcg Oral QAC breakfast  . meclizine  25 mg Oral BID  . nystatin   Topical BID  . pantoprazole  40 mg Oral BID  . polyethylene glycol  17 g Oral Daily  . potassium chloride  20 mEq Oral BID  . predniSONE  30 mg Oral Q breakfast  . sodium chloride  3 mL Intravenous Q12H   Continuous Infusions: . sodium chloride 10 mL/hr at 04/15/13 1751    Principal Problem:   Acute on chronic diastolic HF (heart failure) Active Problems:   COPD (chronic obstructive pulmonary disease)   Morbid obesity   DM type 2 (diabetes mellitus, type 2)   HTN (hypertension), benign   CKD (chronic kidney disease), stage III   COPD with exacerbation   Hyponatremia   Leukocytosis   Anemia   Chest pain   Dyspnea   Acute on  chronic diastolic congestive heart failure   Acute-on-chronic respiratory failure    Time spent: 30 minutes    [facility]  [facility] Pager [phone]. If 7PM-7AM, please contact night-coverage at [email or web address], password [facility] 04/18/2013, 8:44 AM  LOS: 4 days

## 2013-04-18 NOTE — Progress Notes (Signed)
Consulting cardiologist: Dr. Dina Rich   Subjective:    Feeling and breathing better, less edema. Still coughing productively.   Objective:   Temp:  [98 F (36.7 C)-98.4 F (36.9 C)] 98 F (36.7 C) (12/08 0540) Pulse Rate:  [80-90] 80 (12/08 0540) Resp:  [18] 18 (12/08 0540) BP: (121-146)/(50-67) 146/53 mmHg (12/08 0540) SpO2:  [90 %-96 %] 93 % (12/08 0708) Weight:  [244 lb 11.2 oz (110.995 kg)] 244 lb 11.2 oz (110.995 kg) (12/08 0540) Last BM Date: 04/16/13  Filed Weights   04/16/13 0453 04/17/13 0620 04/18/13 0540  Weight: 236 lb 5.3 oz (107.2 kg) 255 lb (115.667 kg) 244 lb 11.2 oz (110.995 kg)    Intake/Output Summary (Last 24 hours) at 04/18/13 0907 Last data filed at 04/18/13 0600  Gross per 24 hour  Intake    932 ml  Output   6700 ml  Net  -5768 ml    Telemetry: NSR rates in the 80's.   Exam:  General: No acute distress.  Lungs: Inspiratory and expiratory wheezes and rhonchi.  Cardiac: No elevated JVP or bruits. RRR, no gallop or rub.   Extremities: No pitting edema, distal pulses full.   Lab Results:  Basic Metabolic Panel:  Recent Labs Lab 04/16/13 0714 04/16/13 1151 04/17/13 0552 04/18/13 0634  NA 136  --  140 137  K 4.6  --  4.5 4.3  CL 94*  --  99 91*  CO2 30  --  34* 39*  GLUCOSE 388* 440* 227* 303*  BUN 43*  --  42* 33*  CREATININE 1.38*  --  1.27* 1.03  CALCIUM 8.8  --  8.8 9.0    CBC:  Recent Labs Lab 04/14/13 1315 04/14/13 2329 04/15/13 0413  WBC 11.6* 9.3 8.6  HGB 11.6* 10.8* 10.5*  HCT 36.1 34.4* 33.5*  MCV 88.0 91.2 88.4  PLT 251 266 283     Medications:   Scheduled Medications: . albuterol  2.5 mg Nebulization Q4H  . aspirin  325 mg Oral Daily  . diltiazem  240 mg Oral Daily  . enoxaparin (LOVENOX) injection  50 mg Subcutaneous Q24H  . furosemide  60 mg Intravenous TID  . gabapentin  600 mg Oral QID  . insulin aspart  0-20 Units Subcutaneous TID WC  . insulin aspart  0-5 Units Subcutaneous QHS  .  insulin aspart  8 Units Subcutaneous TID WC  . insulin detemir  50 Units Subcutaneous Daily  . ipratropium  0.5 mg Nebulization Q4H  . levofloxacin  750 mg Oral Daily  . levothyroxine  250 mcg Oral QAC breakfast  . meclizine  25 mg Oral BID  . nystatin   Topical BID  . pantoprazole  40 mg Oral BID  . polyethylene glycol  17 g Oral Daily  . potassium chloride  20 mEq Oral BID  . predniSONE  30 mg Oral Q breakfast  . sodium chloride  3 mL Intravenous Q12H     Infusions: . sodium chloride 10 mL/hr at 04/15/13 1751   PRN Medications:  acetaminophen, acetaminophen, albuterol, alprazolam, alum & mag hydroxide-simeth, bisacodyl, dextrose, guaiFENesin-dextromethorphan, HYDROcodone-acetaminophen, menthol-cetylpyridinium, morphine injection, ondansetron (ZOFRAN) IV, ondansetron, senna-docusate   Assessment and Plan:   1. Acute on Chronic Diastolic CHF: LVEF 65-70% with grade I diastolic dysfunction. She continues to diurese on high-dose IV Lasix. Creatinine 1.03.  Suggest D/C foley soon to prevent UTI.  2. Hypertension: Currently on Cardizem CD. Systolic blood pressure 121/40. Could consider low-dose ACE inhibitor as  well, particularly with diabetes mellitus. Wait until off IV Lasix and ensure that creatinine is stable.  3. COPD exacerbation: Per PTH. Still with cough and some chest congestion/wheezing.   Bettey Mare. Lyman Bishop NP [facility] 04/18/2013, 9:07 AM   Attending note:  Patient seen and examined. Modified above note by Ms. Lawrence NP. She continues to diuresis on high-dose Lasix, creatinine is stable. No change to oral diuretic as yet. She continues on Cardizem CD, may eventually consider a low dose ACE inhibitor, although will hold off until IV diuresis is completed, and we ensure that creatinine has not changed significantly. Otherwise continue management of COPD per primary team.  Jonelle Sidle, M.D., F.A.C.C.

## 2013-04-18 NOTE — Progress Notes (Signed)
Patient seen and examined.  Agree with note as above.  Patient continues to improve with IV diuresis.  Creatinine also improving with diuresis. Appreciate cardiology assistance.  Would continue with current treatments.  Continue steroids and nebs for copd exacerbation. Will request physical therapy evaluation.  Cherl Gorney

## 2013-04-18 NOTE — Progress Notes (Signed)
Inpatient Diabetes Program Recommendations  AACE/ADA: New Consensus Statement on Inpatient Glycemic Control (2013)  Target Ranges:  Prepandial:   less than 140 mg/dL      Peak postprandial:   less than 180 mg/dL (1-2 hours)      Critically ill patients:  140 - 180 mg/dL    Results for MARJIE, CHEA (MRN [id number]) as of 04/18/2013 09:03  Ref. Range 04/17/2013 07:36 04/17/2013 11:46 04/17/2013 16:59 04/17/2013 22:10 04/18/2013 02:26 04/18/2013 08:00  Glucose-Capillary Latest Range: 70-99 mg/dL 782 (H) 956 (H) 213 (H) 343 (H) 340 (H) 245 (H)   Inpatient Diabetes Program Recommendations Insulin - Basal: Please consider increasing Levemir to 55 units daily. Insulin - Meal Coverage: Please consider increasing Novolog meal coverage to 10 units TID with meals.  Note: Blood glucose ranged from 184-344 mg/dl on 12/16/55 and fasting glucose this morning was 245 mg/dl.  Noted Prednisone was reduced from 40 mg to 30 mg daily.  Please consider increasing Levemir to 55 units daily and increasing meal coverage to Novolog 10 units TID with meals.  Will continue to follow.  Thanks, Orlando Penner, RN, MSN, CCRN Diabetes Coordinator Inpatient Diabetes Program [phone] (Team Pager) [phone] ([facility] office) [phone] [facility] office)

## 2013-04-19 LAB — BASIC METABOLIC PANEL
BUN: 35 mg/dL — ABNORMAL HIGH (ref 6–23)
CO2: 41 mEq/L (ref 19–32)
Calcium: 8.7 mg/dL (ref 8.4–10.5)
Chloride: 91 mEq/L — ABNORMAL LOW (ref 96–112)
Creatinine, Ser: 1.17 mg/dL — ABNORMAL HIGH (ref 0.50–1.10)
GFR calc Af Amer: 56 mL/min — ABNORMAL LOW (ref >90)
GFR calc non Af Amer: 49 mL/min — ABNORMAL LOW (ref >90)
Glucose, Bld: 265 mg/dL — ABNORMAL HIGH (ref 70–99)
Potassium: 4.1 mEq/L (ref 3.5–5.1)
Sodium: 137 mEq/L (ref 135–145)

## 2013-04-19 LAB — CBC
HCT: 37.3 % (ref 36.0–46.0)
Hemoglobin: 12.4 g/dL (ref 12.0–15.0)
MCH: 28.6 pg (ref 26.0–34.0)
MCHC: 33.2 g/dL (ref 30.0–36.0)
MCV: 85.9 fL (ref 78.0–100.0)
Platelets: 434 10*3/uL — ABNORMAL HIGH (ref 150–400)
RBC: 4.34 MIL/uL (ref 3.87–5.11)
RDW: 14.3 % (ref 11.5–15.5)
WBC: 11.8 10*3/uL — ABNORMAL HIGH (ref 4.0–10.5)

## 2013-04-19 LAB — GLUCOSE, CAPILLARY
Glucose-Capillary: 251 mg/dL — ABNORMAL HIGH (ref 70–99)
Glucose-Capillary: 256 mg/dL — ABNORMAL HIGH (ref 70–99)
Glucose-Capillary: 183 mg/dL — ABNORMAL HIGH (ref 70–99)
Glucose-Capillary: 330 mg/dL — ABNORMAL HIGH (ref 70–99)

## 2013-04-19 MED ORDER — INSULIN DETEMIR 100 UNIT/ML ~~LOC~~ SOLN
55.0000 [IU] | Freq: Every day | SUBCUTANEOUS | Status: DC
  Administered 2013-04-19 – 2013-04-20 (×2): 55 [IU] via SUBCUTANEOUS
  Filled 2013-04-19 (×4): qty 0.55

## 2013-04-19 MED ORDER — INSULIN ASPART 100 UNIT/ML ~~LOC~~ SOLN
10.0000 [IU] | Freq: Three times a day (TID) | SUBCUTANEOUS | Status: DC
  Administered 2013-04-19 – 2013-04-20 (×5): 10 [IU] via SUBCUTANEOUS

## 2013-04-19 MED ORDER — FUROSEMIDE 40 MG PO TABS
40.0000 mg | ORAL_TABLET | Freq: Two times a day (BID) | ORAL | Status: DC
  Administered 2013-04-19 – 2013-04-20 (×3): 40 mg via ORAL
  Filled 2013-04-19 (×3): qty 1

## 2013-04-19 NOTE — Progress Notes (Signed)
[facility] PROGRESS NOTE  Stephanie Combs ZOX:[id number] DOB: 1950-07-27 DOA: 04/14/2013 PCP: Toma Deiters, MD  Assessment/Plan:  Acute on chronic diastolic HF (heart failure): The setting of COPD exacerbation. Volume status -15L. Wt. 110.4kg from 115.6kg. IV lasix to po today per cardiology. Echo cardiogram shows preserved EF with diastolic dysfunction, which is similar to last echo.    COPD with exacerbation: somewhat improved but continues with coarse BS and wheeze. Continue nebulizers every 4 hours and steroids at 30 mg. Levaquin dose #5. Will discontinue today after 5 doses. Of note, pt oxygen at home range 2-3L.    Acute on chronic respiratory failure, related to #1 and #2. Improving. Continue current treatments.   Chest pain. Atypical. Improved.  EKG with some T-wave inversions when compared to EKG done in March of this year. Troponin neg x 3. Repeat EKG when yields change QRS axis and t wave changes in inferior leads. Discussed with cardiology and it appears that she does not need any ischemic work up at this time.   DM type 2 (diabetes mellitus, type 2): SSI. Anticipate glucose becoming more stable with steroid taper. Will increase levemir and meal coverage. CBG 251-352.   Hyponatremia: Resolved.   Leukocytosis: resolved. Likely reactive. She is afebrile and non-toxic appearing.    CKD (chronic kidney disease), stage III: Chart review indicates history of chronic kidney disease. Creatinine improved with diuresis.   HTN (hypertension), benign: Stable. SBP range 124-126. Continue diltiazem. Transition lasix to po per cards.   Anemia, Iron deficiency. Will start on iron supplementation as an outpatient. She will need cancer screenings to be performed as an outpatient   Morbid obesity: BMI 38.5. Will request nutritional consult    Code Status: full Family Communication: none present Disposition Plan: home when ready hopefully  tomorrow   Consultants:  cardiology  Procedures:  none  Antibiotics:  04/13/13-04/19/13 for 5 doses  HPI/Subjective: Sitting up reporting "getting there". Appears well. NAD  Objective: Filed Vitals:   04/19/13 0555  BP: 126/78  Pulse: 85  Temp: 98.6 F (37 C)  Resp: 18    Intake/Output Summary (Last 24 hours) at 04/19/13 0908 Last data filed at 04/19/13 8119  Gross per 24 hour  Intake    600 ml  Output   3901 ml  Net  -3301 ml   Filed Weights   04/17/13 0620 04/18/13 0540 04/19/13 0500  Weight: 115.667 kg (255 lb) 110.995 kg (244 lb 11.2 oz) 110.496 kg (243 lb 9.6 oz)    Exam:   General:  Obese calm appears comfortable  Cardiovascular: RRR No m/g/r trace LE edema  Respiratory: normal effort . BS with diffuse rhonchi and mild expiratory wheeze  Abdomen: obese soft +BS non-tender to palpation  Musculoskeletal: no clubbing or cyanosis   Data Reviewed: Basic Metabolic Panel:  Recent Labs Lab 04/15/13 0953 04/16/13 0714 04/16/13 1151 04/17/13 0552 04/18/13 0634 04/19/13 0500  NA 137 136  --  140 137 137  K 4.3 4.6  --  4.5 4.3 4.1  CL 99 94*  --  99 91* 91*  CO2 28 30  --  34* 39* 41*  GLUCOSE 169* 388* 440* 227* 303* 265*  BUN 42* 43*  --  42* 33* 35*  CREATININE 1.61* 1.38*  --  1.27* 1.03 1.17*  CALCIUM 9.3 8.8  --  8.8 9.0 8.7   Liver Function Tests: No results found for this basename: AST, ALT, ALKPHOS, BILITOT, PROT, ALBUMIN,  in the last 168 hours No  results found for this basename: LIPASE, AMYLASE,  in the last 168 hours No results found for this basename: AMMONIA,  in the last 168 hours CBC:  Recent Labs Lab 04/14/13 1315 04/14/13 2329 04/15/13 0413 04/19/13 0500  WBC 11.6* 9.3 8.6 11.8*  NEUTROABS 8.7*  --   --   --   HGB 11.6* 10.8* 10.5* 12.4  HCT 36.1 34.4* 33.5* 37.3  MCV 88.0 91.2 88.4 85.9  PLT 251 266 283 434*   Cardiac Enzymes:  Recent Labs Lab 04/14/13 1315 04/14/13 1942 04/15/13 0413  TROPONINI <0.30 <0.30  <0.30   BNP (last 3 results)  Recent Labs  04/14/13 1315  PROBNP 14124.0*   CBG:  Recent Labs Lab 04/18/13 0800 04/18/13 1147 04/18/13 1718 04/18/13 2145 04/19/13 0749  GLUCAP 245* 278* 276* 352* 251*    Recent Results (from the past 240 hour(s))  MRSA PCR SCREENING     Status: None   Collection Time    04/15/13  1:00 AM      Result Value Range Status   MRSA by PCR NEGATIVE  NEGATIVE Final   Comment:            The GeneXpert MRSA Assay (FDA     approved for NASAL specimens     only), is one component of a     comprehensive MRSA colonization     surveillance program. It is not     intended to diagnose MRSA     infection nor to guide or     monitor treatment for     MRSA infections.     Studies: No results found.  Scheduled Meds: . albuterol  2.5 mg Nebulization Q4H  . aspirin  325 mg Oral Daily  . diltiazem  240 mg Oral Daily  . enoxaparin (LOVENOX) injection  50 mg Subcutaneous Q24H  . furosemide  40 mg Oral BID  . gabapentin  600 mg Oral QID  . insulin aspart  0-20 Units Subcutaneous TID WC  . insulin aspart  0-5 Units Subcutaneous QHS  . insulin aspart  10 Units Subcutaneous TID WC  . insulin detemir  55 Units Subcutaneous Daily  . ipratropium  0.5 mg Nebulization Q4H  . levofloxacin  750 mg Oral Daily  . levothyroxine  250 mcg Oral QAC breakfast  . meclizine  25 mg Oral BID  . nystatin   Topical BID  . pantoprazole  40 mg Oral BID  . polyethylene glycol  17 g Oral Daily  . potassium chloride  20 mEq Oral BID  . predniSONE  30 mg Oral Q breakfast  . sodium chloride  3 mL Intravenous Q12H   Continuous Infusions: . sodium chloride 10 mL/hr at 04/15/13 1751    Principal Problem:   Acute on chronic diastolic HF (heart failure) Active Problems:   COPD (chronic obstructive pulmonary disease)   Morbid obesity   DM type 2 (diabetes mellitus, type 2)   HTN (hypertension), benign   CKD (chronic kidney disease), stage III   COPD with exacerbation    Hyponatremia   Leukocytosis   Anemia   Chest pain   Dyspnea   Acute on chronic diastolic congestive heart failure   Acute-on-chronic respiratory failure    Time spent: 30 minutes    [facility] M  [facility] Pager [phone]. If 7PM-7AM, please contact night-coverage at [email or web address], password [facility] 04/19/2013, 9:08 AM  LOS: 5 days

## 2013-04-19 NOTE — Progress Notes (Signed)
Consulting cardiologist: Dr. Jonelle Sidle  Subjective:    Up in room and out of bed yesterday. Still weak, but breathing better in general. Cough. No chest pain.  Objective:   Temp:  [97.9 F (36.6 C)-98.6 F (37 C)] 98.6 F (37 C) (12/09 0555) Pulse Rate:  [69-89] 85 (12/09 0555) Resp:  [18] 18 (12/09 0555) BP: (121-126)/(76-78) 126/78 mmHg (12/09 0555) SpO2:  [90 %-96 %] 93 % (12/09 0737) Weight:  [243 lb 9.6 oz (110.496 kg)] 243 lb 9.6 oz (110.496 kg) (12/09 0500) Last BM Date: 04/18/13  Filed Weights   04/17/13 0620 04/18/13 0540 04/19/13 0500  Weight: 255 lb (115.667 kg) 244 lb 11.2 oz (110.995 kg) 243 lb 9.6 oz (110.496 kg)    Intake/Output Summary (Last 24 hours) at 04/19/13 0850 Last data filed at 04/19/13 1610  Gross per 24 hour  Intake    600 ml  Output   3901 ml  Net  -3301 ml   Telemetry: Sinus rhythm.  Exam:  General: No distress.  Lungs: Expiratory wheezes, rhonchi.  Cardiac: RRR, no gallop.  Extremities: No pitting.  Lab Results:  Basic Metabolic Panel:  Recent Labs Lab 04/17/13 0552 04/18/13 0634 04/19/13 0500  NA 140 137 137  K 4.5 4.3 4.1  CL 99 91* 91*  CO2 34* 39* 41*  GLUCOSE 227* 303* 265*  BUN 42* 33* 35*  CREATININE 1.27* 1.03 1.17*  CALCIUM 8.8 9.0 8.7    CBC:  Recent Labs Lab 04/14/13 2329 04/15/13 0413 04/19/13 0500  WBC 9.3 8.6 11.8*  HGB 10.8* 10.5* 12.4  HCT 34.4* 33.5* 37.3  MCV 91.2 88.4 85.9  PLT 266 283 434*    Cardiac Enzymes:  Recent Labs Lab 04/14/13 1315 04/14/13 1942 04/15/13 0413  TROPONINI <0.30 <0.30 <0.30    Medications:   Scheduled Medications: . albuterol  2.5 mg Nebulization Q4H  . aspirin  325 mg Oral Daily  . diltiazem  240 mg Oral Daily  . enoxaparin (LOVENOX) injection  50 mg Subcutaneous Q24H  . furosemide  60 mg Intravenous TID  . gabapentin  600 mg Oral QID  . insulin aspart  0-20 Units Subcutaneous TID WC  . insulin aspart  0-5 Units Subcutaneous QHS  .  insulin aspart  10 Units Subcutaneous TID WC  . insulin detemir  55 Units Subcutaneous Daily  . ipratropium  0.5 mg Nebulization Q4H  . levofloxacin  750 mg Oral Daily  . levothyroxine  250 mcg Oral QAC breakfast  . meclizine  25 mg Oral BID  . nystatin   Topical BID  . pantoprazole  40 mg Oral BID  . polyethylene glycol  17 g Oral Daily  . potassium chloride  20 mEq Oral BID  . predniSONE  30 mg Oral Q breakfast  . sodium chloride  3 mL Intravenous Q12H     Infusions: . sodium chloride 10 mL/hr at 04/15/13 1751     PRN Medications:  acetaminophen, acetaminophen, albuterol, alprazolam, alum & mag hydroxide-simeth, bisacodyl, dextrose, guaiFENesin-dextromethorphan, HYDROcodone-acetaminophen, menthol-cetylpyridinium, morphine injection, ondansetron (ZOFRAN) IV, ondansetron, senna-docusate   Assessment:   1. Acute on Chronic Diastolic CHF: LVEF 65-70% with grade I diastolic dysfunction. Had 3000 cc output last 24 hours on high-dose IV Lasix. BUN and creatinine 35/1.17.  2. Hypertension: Currently on Cardizem CD. Systolic blood pressure controlled at present. Could consider low-dose ACE inhibitor later, particularly with diabetes mellitus. Would wait until off IV Lasix and ensure that renal function is stable.  3. COPD exacerbation: Per PTH. Better, but still with some cough and chest congestion/wheezing.   Plan/Discussion:    Will try and make switch to oral Lasix today. Would suggest D/C Foley and increase activity as tolerated. Otherwise continue ASA and Cardizem CD. BMET AM.   Jonelle Sidle, M.D., F.A.C.C.

## 2013-04-19 NOTE — Evaluation (Signed)
Physical Therapy Evaluation Patient Details Name: Stephanie Combs MRN: [id number] DOB: 12-22-1950 Today's Date: 04/19/2013 Time: 4098-1191 PT Time Calculation (min): 36 min  PT Assessment / Plan / Recommendation History of Present Illness  Pt with a hx of DM, HTN, chronic kidney disease and COPD is admitted with acute on chronic CHF.  She is morbidly obese.  Pt lives alone and is normally independent with ADLs...she is chronically on supplemental O2.  Clinical Impression   Pt is seen for evaluation and found to be very close to prior functional level.  She should be able to transition to home with occasional family assist.    PT Assessment  Patent does not need any further PT services    Follow Up Recommendations  No PT follow up    Does the patient have the potential to tolerate intense rehabilitation      Barriers to Discharge        Equipment Recommendations  None recommended by PT    Recommendations for Other Services     Frequency      Precautions / Restrictions Precautions Precautions: None Restrictions Weight Bearing Restrictions: No   Pertinent Vitals/Pain       Mobility  Bed Mobility Bed Mobility: Supine to Sit Supine to Sit: 7: Independent Transfers Transfers: Sit to Stand;Stand to Sit Sit to Stand: 7: Independent;With upper extremity assist Stand to Sit: 7: Independent;With upper extremity assist Ambulation/Gait Ambulation/Gait Assistance: 6: Modified independent (Device/Increase time) Ambulation Distance (Feet): 100 Feet Assistive device: Rolling walker Gait Pattern: Within Functional Limits Gait velocity: WNL Stairs: No Wheelchair Mobility Wheelchair Mobility: No    Exercises     PT Diagnosis:    PT Problem List:   PT Treatment Interventions:       PT Goals(Current goals can be found in the care plan section) Acute Rehab PT Goals PT Goal Formulation: No goals set, d/c therapy  Visit Information  Last PT Received On: 04/19/13 History  of Present Illness: Pt with a hx of DM, HTN, chronic kidney disease and COPD is admitted with acute on chronic CHF.  She is morbidly obese.  Pt lives alone and is normally independent with ADLs...she is chronically on supplemental O2.       Prior Functioning  Home Living Family/patient expects to be discharged to:: Private residence Living Arrangements: Alone Available Help at Discharge: Family;Available PRN/intermittently Type of Home: House Home Access: Ramped entrance Home Layout: One level Home Equipment: Emergency planning/management officer - 2 wheels;Cane - single point;Bedside commode Prior Function Level of Independence: Independent with assistive device(s) Comments: uses a walker for gait Communication Communication: No difficulties    Cognition  Cognition Arousal/Alertness: Awake/alert Behavior During Therapy: WFL for tasks assessed/performed Overall Cognitive Status: Within Functional Limits for tasks assessed    Extremity/Trunk Assessment Lower Extremity Assessment Lower Extremity Assessment: Overall WFL for tasks assessed Cervical / Trunk Assessment Cervical / Trunk Assessment: Normal   Balance Balance Balance Assessed: No  End of Session PT - End of Session Activity Tolerance: Patient tolerated treatment well Patient left: in bed;with family/visitor present;with call bell/phone within reach  GP     Konrad Penta 04/19/2013, 4:25 PM

## 2013-04-19 NOTE — Progress Notes (Signed)
Patient seen and examined.  Above note reviewed and agree.  She was admitted to the hospital with acute on chronic resp failure due to CHF/COPD exacerbation.  She was started on appropriate treatment with diuretics, nebs and steroids.  The patient has improved significantly.  She continues to diurese well and now lasix has been changed to po.  She is chronically on 2-3L of oxygen at home.  I suspect she is approaching discharge.  Foley catheter was discontinued today and she was encouraged to ambulate.  Anticipate discharge home in the next 24-48 hours.  Rosealee Recinos

## 2013-04-20 DIAGNOSIS — R079 Chest pain, unspecified: Secondary | ICD-10-CM

## 2013-04-20 LAB — BASIC METABOLIC PANEL
BUN: 33 mg/dL — ABNORMAL HIGH (ref 6–23)
CO2: 37 mEq/L — ABNORMAL HIGH (ref 19–32)
Calcium: 8.6 mg/dL (ref 8.4–10.5)
Chloride: 92 mEq/L — ABNORMAL LOW (ref 96–112)
Creatinine, Ser: 1.15 mg/dL — ABNORMAL HIGH (ref 0.50–1.10)
GFR calc Af Amer: 57 mL/min — ABNORMAL LOW (ref >90)
GFR calc non Af Amer: 50 mL/min — ABNORMAL LOW (ref >90)
Glucose, Bld: 260 mg/dL — ABNORMAL HIGH (ref 70–99)
Potassium: 4 mEq/L (ref 3.5–5.1)
Sodium: 137 mEq/L (ref 135–145)

## 2013-04-20 LAB — CBC
HCT: 37.1 % (ref 36.0–46.0)
Hemoglobin: 12 g/dL (ref 12.0–15.0)
MCH: 27.7 pg (ref 26.0–34.0)
MCHC: 32.3 g/dL (ref 30.0–36.0)
MCV: 85.7 fL (ref 78.0–100.0)
Platelets: 412 10*3/uL — ABNORMAL HIGH (ref 150–400)
RBC: 4.33 MIL/uL (ref 3.87–5.11)
RDW: 14.5 % (ref 11.5–15.5)
WBC: 11.3 10*3/uL — ABNORMAL HIGH (ref 4.0–10.5)

## 2013-04-20 LAB — TROPONIN I
Troponin I: <0.3 ng/mL (ref <0.30)
Troponin I: <0.3 ng/mL (ref <0.30)

## 2013-04-20 LAB — GLUCOSE, CAPILLARY
Glucose-Capillary: 219 mg/dL — ABNORMAL HIGH (ref 70–99)
Glucose-Capillary: 193 mg/dL — ABNORMAL HIGH (ref 70–99)
Glucose-Capillary: 295 mg/dL — ABNORMAL HIGH (ref 70–99)
Glucose-Capillary: 235 mg/dL — ABNORMAL HIGH (ref 70–99)

## 2013-04-20 MED ORDER — INSULIN ASPART 100 UNIT/ML ~~LOC~~ SOLN
12.0000 [IU] | Freq: Three times a day (TID) | SUBCUTANEOUS | Status: DC
  Administered 2013-04-20 – 2013-04-21 (×3): 12 [IU] via SUBCUTANEOUS

## 2013-04-20 MED ORDER — GLYBURIDE 5 MG PO TABS
2.5000 mg | ORAL_TABLET | Freq: Two times a day (BID) | ORAL | Status: DC
  Administered 2013-04-21: 2.5 mg via ORAL
  Filled 2013-04-20: qty 1

## 2013-04-20 MED ORDER — METFORMIN HCL 500 MG PO TABS
500.0000 mg | ORAL_TABLET | Freq: Two times a day (BID) | ORAL | Status: DC
  Administered 2013-04-21: 500 mg via ORAL
  Filled 2013-04-20: qty 1

## 2013-04-20 MED ORDER — INSULIN DETEMIR 100 UNIT/ML ~~LOC~~ SOLN
60.0000 [IU] | Freq: Every day | SUBCUTANEOUS | Status: DC
  Administered 2013-04-20: 60 [IU] via SUBCUTANEOUS
  Filled 2013-04-20 (×2): qty 0.6

## 2013-04-20 MED ORDER — FUROSEMIDE 40 MG PO TABS
60.0000 mg | ORAL_TABLET | Freq: Two times a day (BID) | ORAL | Status: DC
  Administered 2013-04-20 – 2013-04-21 (×2): 60 mg via ORAL
  Filled 2013-04-20 (×4): qty 1

## 2013-04-20 MED ORDER — PREDNISONE 20 MG PO TABS
20.0000 mg | ORAL_TABLET | Freq: Every day | ORAL | Status: DC
  Administered 2013-04-21: 20 mg via ORAL
  Filled 2013-04-20: qty 1

## 2013-04-20 NOTE — Progress Notes (Signed)
Pt ambulated around nurses station and back to her room. I monitored her O2 sats on room air and she maintained in the 90's. She denies any chest pain during ambulation, states that she does feel a little SOB and like her heart is racing. I informed her that this is likely due to this being the most activity that she has had in a week or two. Placed pt back on O2 when she returned to her room.  Pt tolerated very well. [name]

## 2013-04-20 NOTE — Progress Notes (Signed)
Consulting cardiologist: Dr. Dina Rich  Subjective:    State she ambulated in the hall twice yesterday and did well with a walker. Last night she got up to go the bathroom, was short of breath and had a breathing treatment. After that she has left-sided chest pain.  Objective:   Temp:  [97.5 F (36.4 C)-98.5 F (36.9 C)] 97.5 F (36.4 C) (12/10 0700) Pulse Rate:  [85-88] 85 (12/10 0700) Resp:  [15-18] 15 (12/10 0700) BP: (128-130)/(45-69) 128/45 mmHg (12/10 0700) SpO2:  [93 %-96 %] 96 % (12/10 0735) Last BM Date: 04/19/13  Filed Weights   04/17/13 0620 04/18/13 0540 04/19/13 0500  Weight: 255 lb (115.667 kg) 244 lb 11.2 oz (110.995 kg) 243 lb 9.6 oz (110.496 kg)    Intake/Output Summary (Last 24 hours) at 04/20/13 0834 Last data filed at 04/20/13 0100  Gross per 24 hour  Intake    480 ml  Output    200 ml  Net    280 ml    Telemetry: Sinus rhythm.  Exam:  General: Chronically ill-appearing, in no distress.  Lungs: Coarse breath sounds with end expiratory wheezes.  Cardiac: RRR, no gallop.  Extremities: No edema.   Lab Results:  Basic Metabolic Panel:  Recent Labs Lab 04/18/13 0634 04/19/13 0500 04/20/13 0611  NA 137 137 137  K 4.3 4.1 4.0  CL 91* 91* 92*  CO2 39* 41* 37*  GLUCOSE 303* 265* 260*  BUN 33* 35* 33*  CREATININE 1.03 1.17* 1.15*  CALCIUM 9.0 8.7 8.6    CBC:  Recent Labs Lab 04/15/13 0413 04/19/13 0500 04/20/13 0611  WBC 8.6 11.8* 11.3*  HGB 10.5* 12.4 12.0  HCT 33.5* 37.3 37.1  MCV 88.4 85.9 85.7  PLT 283 434* 412*    Cardiac Enzymes:  Recent Labs Lab 04/14/13 1315 04/14/13 1942 04/15/13 0413  TROPONINI <0.30 <0.30 <0.30     Medications:   Scheduled Medications: . albuterol  2.5 mg Nebulization Q4H  . aspirin  325 mg Oral Daily  . diltiazem  240 mg Oral Daily  . enoxaparin (LOVENOX) injection  50 mg Subcutaneous Q24H  . furosemide  40 mg Oral BID  . gabapentin  600 mg Oral QID  . insulin aspart  0-20  Units Subcutaneous TID WC  . insulin aspart  0-5 Units Subcutaneous QHS  . insulin aspart  10 Units Subcutaneous TID WC  . insulin detemir  55 Units Subcutaneous Daily  . ipratropium  0.5 mg Nebulization Q4H  . levofloxacin  750 mg Oral Daily  . levothyroxine  250 mcg Oral QAC breakfast  . meclizine  25 mg Oral BID  . nystatin   Topical BID  . pantoprazole  40 mg Oral BID  . polyethylene glycol  17 g Oral Daily  . potassium chloride  20 mEq Oral BID  . predniSONE  30 mg Oral Q breakfast  . sodium chloride  3 mL Intravenous Q12H     Infusions: . sodium chloride 10 mL/hr at 04/15/13 1751     PRN Medications:  acetaminophen, acetaminophen, albuterol, alprazolam, alum & mag hydroxide-simeth, bisacodyl, dextrose, guaiFENesin-dextromethorphan, HYDROcodone-acetaminophen, menthol-cetylpyridinium, morphine injection, ondansetron (ZOFRAN) IV, ondansetron, senna-docusate   Assessment:   1. Acute on chronic diastolic heart failure. Urine output has slowed following conversion to oral Lasix. LVEF 65-70% with grade 1 diastolic dysfunction by recent assessment.  2. Episode of chest pain overnight following breathing treatment.  3. COPD exacerbation. She continues on breathing treatments, antibiotics, and steroids. Slowly improving.  4. Hypertension, blood pressure stable with systolics in the 120s to 130s on Cardizem CD. No additional medications being at this time.  Plan/Discussion:    Increase Lasix to 60 mg by mouth twice a day. Followup ECG and set of cardiac markers with recent chest pain overnight. Her initial cardiac markers were normal. Would observe further with increased activity presuming workup is reassuring.   Jonelle Sidle, M.D., F.A.C.C.

## 2013-04-20 NOTE — Progress Notes (Signed)
The patient was seen and examined. She was discussed with nurse practitioner, Ms. Vedia Coffer. Agree with her assessment and findings. Cardiologist, Dr. Ival Bible assessment and recommendations noted and appreciated. She continues to have a few scattered rhonchus wheezing on exam, but her breathing is nonlabored. She no longer has chest pain. Her troponin I is negative x3. This afternoon, she ambulated fairly well with anticipated shortness of breath and tachycardia and but with her oxygen saturations were maintaining in the 90s on room air.   The patient's capillary blood glucose has been trending upward, attributed to steroid treatment.. Her insulin has been titrated up. Will restart metformin and glyburide tomorrow. She will likely be ready for discharge tomorrow or Friday.

## 2013-04-20 NOTE — Progress Notes (Signed)
Inpatient Diabetes Program Recommendations  AACE/ADA: New Consensus Statement on Inpatient Glycemic Control (2013)  Target Ranges:  Prepandial:   less than 140 mg/dL      Peak postprandial:   less than 180 mg/dL (1-2 hours)      Critically ill patients:  140 - 180 mg/dL   Results for FIDELIA, CATHERS (MRN [id number]) as of 04/20/2013 11:53  Ref. Range 04/19/2013 07:49 04/19/2013 11:49 04/19/2013 16:52 04/19/2013 22:00 04/20/2013 07:17 04/20/2013 11:21  Glucose-Capillary Latest Range: 70-99 mg/dL 409 (H) 811 (H) 914 (H) 330 (H) 219 (H) 193 (H)    Inpatient Diabetes Program Recommendations Insulin - Basal: Please consider increasing Levemir to 60 units daily. Insulin - Meal Coverage: Please consider increasing Novolog meal coverage to 12 units TID with meals.  Thanks, Orlando Penner, RN, MSN, CCRN Diabetes Coordinator Inpatient Diabetes Program [phone] (Team Pager) [phone] (AP office) [phone] [facility] office)

## 2013-04-20 NOTE — Progress Notes (Signed)
[facility] PROGRESS NOTE  Stephanie Combs ZOX:[id number] DOB: 12/10/1950 DOA: 04/14/2013 PCP: Toma Deiters, MD  Assessment/Plan: Acute on chronic diastolic HF (heart failure): The setting of COPD exacerbation. Volume status -14L. Urine output somewhat less since lasix changed to po.  Wt. Lasix increased to 60mg  BID per cardiology.   Echo cardiogram shows preserved EF with grade 1 diastolic dysfunction, which is similar to last echo.   COPD with exacerbation: continues to improve. Improved air flow and less wheeze. Continue nebulizers every 4 hours. Will taper steroid 20 mg. Completed 5 doses of Levaquin. Of note, pt oxygen at home range 2-3L.   Acute on chronic respiratory failure, related to #1 and #2. Improving. Continue current treatments.   Chest pain. Episode CP and SOB when ambulating to BR last night. Quickly resolved. Troponin this am negative. Repeat EKG with NSR and some T-wave abnormality. Evaluated by cardiology who opines further observation with activity warranted. Will cycle troponin as well.   DM type 2 (diabetes mellitus, type 2): SSI. Anticipate glucose becoming more stable with steroid taper. Will increase levemir and meal coverage. CBG 193-330.   Hyponatremia: Resolved.   Leukocytosis: Likely related to steroids.  She is afebrile and non-toxic appearing.   CKD (chronic kidney disease), stage III: Chart review indicates history of chronic kidney disease. Creatinine improved initially with diuresis. Current creatinine appears close to baseline.   HTN (hypertension), benign: Stable. SBP range 128-130. Continue diltiazem.    Anemia, Iron deficiency. Will start on iron supplementation as an outpatient. She will need cancer screenings to be performed as an outpatient   Morbid obesity: BMI 38.5. Will request nutritional consult     Code Status: full Family Communication: none present Disposition Plan: home hopefully tomorrow   Consultants:  Dr. Diona Browner  cardiology  Procedures:  none  Antibiotics:  levaquin 04/13/13>>>04/19/13 for 5 doses  HPI/Subjective: Sitting up eating lunch. Reports fatigue today as last night had episode chest pain. Reports breathing continuing to improve  Objective: Filed Vitals:   04/20/13 0700  BP: 128/45  Pulse: 85  Temp: 97.5 F (36.4 C)  Resp: 15    Intake/Output Summary (Last 24 hours) at 04/20/13 1233 Last data filed at 04/20/13 0100  Gross per 24 hour  Intake    240 ml  Output    200 ml  Net     40 ml   Filed Weights   04/17/13 0620 04/18/13 0540 04/19/13 0500  Weight: 115.667 kg (255 lb) 110.995 kg (244 lb 11.2 oz) 110.496 kg (243 lb 9.6 oz)    Exam:   General:  Obese comfortable  Cardiovascular: RRR No murmur gallop or rub. Trace LE edema  Respiratory: normal effort. BS with improved air flow. BS with scattered rhonchi and faint wheeze.   Abdomen: obese soft +BS. Non-tender to palpation  Musculoskeletal: no clubbing or cyanosis   Data Reviewed: Basic Metabolic Panel:  Recent Labs Lab 04/16/13 0714 04/16/13 1151 04/17/13 0552 04/18/13 0634 04/19/13 0500 04/20/13 0611  NA 136  --  140 137 137 137  K 4.6  --  4.5 4.3 4.1 4.0  CL 94*  --  99 91* 91* 92*  CO2 30  --  34* 39* 41* 37*  GLUCOSE 388* 440* 227* 303* 265* 260*  BUN 43*  --  42* 33* 35* 33*  CREATININE 1.38*  --  1.27* 1.03 1.17* 1.15*  CALCIUM 8.8  --  8.8 9.0 8.7 8.6   Liver Function Tests: No results found for this  basename: AST, ALT, ALKPHOS, BILITOT, PROT, ALBUMIN,  in the last 168 hours No results found for this basename: LIPASE, AMYLASE,  in the last 168 hours No results found for this basename: AMMONIA,  in the last 168 hours CBC:  Recent Labs Lab 04/14/13 1315 04/14/13 2329 04/15/13 0413 04/19/13 0500 04/20/13 0611  WBC 11.6* 9.3 8.6 11.8* 11.3*  NEUTROABS 8.7*  --   --   --   --   HGB 11.6* 10.8* 10.5* 12.4 12.0  HCT 36.1 34.4* 33.5* 37.3 37.1  MCV 88.0 91.2 88.4 85.9 85.7  PLT 251 266  283 434* 412*   Cardiac Enzymes:  Recent Labs Lab 04/14/13 1315 04/14/13 1942 04/15/13 0413 04/20/13 0611  TROPONINI <0.30 <0.30 <0.30 <0.30   BNP (last 3 results)  Recent Labs  04/14/13 1315  PROBNP 14124.0*   CBG:  Recent Labs Lab 04/19/13 1149 04/19/13 1652 04/19/13 2200 04/20/13 0717 04/20/13 1121  GLUCAP 256* 183* 330* 219* 193*    Recent Results (from the past 240 hour(s))  MRSA PCR SCREENING     Status: None   Collection Time    04/15/13  1:00 AM      Result Value Range Status   MRSA by PCR NEGATIVE  NEGATIVE Final   Comment:            The GeneXpert MRSA Assay (FDA     approved for NASAL specimens     only), is one component of a     comprehensive MRSA colonization     surveillance program. It is not     intended to diagnose MRSA     infection nor to guide or     monitor treatment for     MRSA infections.     Studies: No results found.  Scheduled Meds: . albuterol  2.5 mg Nebulization Q4H  . aspirin  325 mg Oral Daily  . diltiazem  240 mg Oral Daily  . enoxaparin (LOVENOX) injection  50 mg Subcutaneous Q24H  . furosemide  60 mg Oral BID  . gabapentin  600 mg Oral QID  . insulin aspart  0-20 Units Subcutaneous TID WC  . insulin aspart  0-5 Units Subcutaneous QHS  . insulin aspart  10 Units Subcutaneous TID WC  . insulin detemir  55 Units Subcutaneous Daily  . ipratropium  0.5 mg Nebulization Q4H  . levofloxacin  750 mg Oral Daily  . levothyroxine  250 mcg Oral QAC breakfast  . meclizine  25 mg Oral BID  . nystatin   Topical BID  . pantoprazole  40 mg Oral BID  . polyethylene glycol  17 g Oral Daily  . potassium chloride  20 mEq Oral BID  . predniSONE  30 mg Oral Q breakfast  . sodium chloride  3 mL Intravenous Q12H   Continuous Infusions: . sodium chloride 10 mL/hr at 04/15/13 1751    Principal Problem:   Acute on chronic diastolic HF (heart failure) Active Problems:   COPD (chronic obstructive pulmonary disease)   Morbid  obesity   DM type 2 (diabetes mellitus, type 2)   HTN (hypertension), benign   CKD (chronic kidney disease), stage III   COPD with exacerbation   Hyponatremia   Leukocytosis   Anemia   Chest pain   Dyspnea   Acute on chronic diastolic congestive heart failure   Acute-on-chronic respiratory failure    Time spent: 30 minutes    [facility] M  [facility] Pager [phone]. If 7PM-7AM, please contact night-coverage  at [email or web address], password [facility] 04/20/2013, 12:33 PM  LOS: 6 days

## 2013-04-21 LAB — BASIC METABOLIC PANEL
BUN: 34 mg/dL — ABNORMAL HIGH (ref 6–23)
CO2: 33 mEq/L — ABNORMAL HIGH (ref 19–32)
Calcium: 8.5 mg/dL (ref 8.4–10.5)
Chloride: 91 mEq/L — ABNORMAL LOW (ref 96–112)
Creatinine, Ser: 1.13 mg/dL — ABNORMAL HIGH (ref 0.50–1.10)
GFR calc Af Amer: 59 mL/min — ABNORMAL LOW (ref >90)
GFR calc non Af Amer: 51 mL/min — ABNORMAL LOW (ref >90)
Glucose, Bld: 358 mg/dL — ABNORMAL HIGH (ref 70–99)
Potassium: 4 mEq/L (ref 3.5–5.1)
Sodium: 133 mEq/L — ABNORMAL LOW (ref 135–145)

## 2013-04-21 LAB — GLUCOSE, CAPILLARY
Glucose-Capillary: 301 mg/dL — ABNORMAL HIGH (ref 70–99)
Glucose-Capillary: 352 mg/dL — ABNORMAL HIGH (ref 70–99)
Glucose-Capillary: 104 mg/dL — ABNORMAL HIGH (ref 70–99)

## 2013-04-21 LAB — CBC
HCT: 36.2 % (ref 36.0–46.0)
Hemoglobin: 11.8 g/dL — ABNORMAL LOW (ref 12.0–15.0)
MCH: 28.2 pg (ref 26.0–34.0)
MCHC: 32.6 g/dL (ref 30.0–36.0)
MCV: 86.4 fL (ref 78.0–100.0)
Platelets: 415 10*3/uL — ABNORMAL HIGH (ref 150–400)
RBC: 4.19 MIL/uL (ref 3.87–5.11)
RDW: 14.8 % (ref 11.5–15.5)
WBC: 13 10*3/uL — ABNORMAL HIGH (ref 4.0–10.5)

## 2013-04-21 MED ORDER — ALBUTEROL SULFATE HFA 108 (90 BASE) MCG/ACT IN AERS
2.0000 | INHALATION_SPRAY | RESPIRATORY_TRACT | Status: DC
  Administered 2013-04-21: 2 via RESPIRATORY_TRACT
  Filled 2013-04-21: qty 6.7

## 2013-04-21 MED ORDER — PANTOPRAZOLE SODIUM 40 MG PO TBEC: 40.0000 mg | DELAYED_RELEASE_TABLET | Freq: Two times a day (BID) | ORAL | Status: DC

## 2013-04-21 MED ORDER — PREDNISONE 10 MG PO TABS: ORAL_TABLET | ORAL | Status: DC

## 2013-04-21 MED ORDER — ALPRAZOLAM 1 MG PO TABS
1.0000 mg | ORAL_TABLET | Freq: Once | ORAL | Status: AC
  Administered 2013-04-21: 1 mg via ORAL
  Filled 2013-04-21: qty 1

## 2013-04-21 MED ORDER — INSULIN ASPART 100 UNIT/ML ~~LOC~~ SOLN: 25.0000 [IU] | Freq: Every day | SUBCUTANEOUS | Status: DC

## 2013-04-21 MED ORDER — ALBUTEROL SULFATE HFA 108 (90 BASE) MCG/ACT IN AERS: 1.0000 | INHALATION_SPRAY | RESPIRATORY_TRACT | Status: DC | PRN

## 2013-04-21 MED ORDER — FUROSEMIDE 20 MG PO TABS: 60.0000 mg | ORAL_TABLET | Freq: Two times a day (BID) | ORAL | Status: DC

## 2013-04-21 MED ORDER — INSULIN DETEMIR 100 UNIT/ML ~~LOC~~ SOLN: 45.0000 [IU] | Freq: Every day | SUBCUTANEOUS | Status: DC

## 2013-04-21 MED ORDER — GUAIFENESIN-DM 100-10 MG/5ML PO SYRP: 5.0000 mL | ORAL_SOLUTION | ORAL | Status: DC | PRN

## 2013-04-21 MED ORDER — ALUM & MAG HYDROXIDE-SIMETH 200-200-20 MG/5ML PO SUSP: 30.0000 mL | Freq: Four times a day (QID) | ORAL | Status: DC | PRN

## 2013-04-21 MED ORDER — ALBUTEROL SULFATE HFA 108 (90 BASE) MCG/ACT IN AERS: 1.0000 | INHALATION_SPRAY | Freq: Four times a day (QID) | RESPIRATORY_TRACT | Status: DC | PRN

## 2013-04-21 NOTE — Discharge Summary (Signed)
The patient was seen and examined. She was discussed with nurse practitioner Ms. Black. Agree with her findings and assessment and plan. The patient was encouraged to stop smoking forever. She was instructed to take her medications as prescribed. She will followup with cardiology next week as scheduled.

## 2013-04-21 NOTE — Progress Notes (Signed)
Pt verbalizes understanding of d/c instructions, medications and follow up appts. Pt has prescriptions at time of d/c. IV d/c without complications. Pt d/c via wheelchair accompanied by staff member and her family. [name]

## 2013-04-21 NOTE — Progress Notes (Signed)
Subjective:  Still complains of some chest tightness when ambulating. Eases with rest. Also complains of increased stress and anxiety. Worried they are trying to wean her from anxiolytics  Objective:  Vital Signs in the last 24 hours: Temp:  [97.4 F (36.3 C)-97.9 F (36.6 C)] 97.4 F (36.3 C) (12/11 0641) Pulse Rate:  [85-90] 90 (12/11 0641) Resp:  [16] 16 (12/11 0641) BP: (91-122)/(41-68) 122/41 mmHg (12/11 0641) SpO2:  [92 %-100 %] 99 % (12/11 0701) Weight:  [274 lb 6.4 oz (124.467 kg)] 274 lb 6.4 oz (124.467 kg) (12/11 0641)  Intake/Output from previous day: 12/10 0701 - 12/11 0700 In: 720 [P.O.:720] Out: 1775 [Urine:1775] Intake/Output from this shift:    Physical Exam: NECK: Without JVD, HJR, or bruit LUNGS: Decreased breath sounds with basilar crackles and scattered wheezes. HEART: Regular rate and rhythm, no murmur, gallop, rub, bruit, thrill, or heave EXTREMITIES:plus 1 edema bilaterally, otherwise Without cyanosis, clubbing   Lab Results:  Recent Labs  04/20/13 0611 04/21/13 0531  WBC 11.3* 13.0*  HGB 12.0 11.8*  PLT 412* 415*    Recent Labs  04/20/13 0611 04/21/13 0531  NA 137 133*  K 4.0 4.0  CL 92* 91*  CO2 37* 33*  GLUCOSE 260* 358*  BUN 33* 34*  CREATININE 1.15* 1.13*    Recent Labs  04/20/13 0611 04/20/13 1339  TROPONINI <0.30 <0.30   Hepatic Function Panel No results found for this basename: PROT, ALBUMIN, AST, ALT, ALKPHOS, BILITOT, BILIDIR, IBILI,  in the last 72 hours No results found for this basename: CHOL,  in the last 72 hours No results found for this basename: PROTIME,  in the last 72 hours  Imaging: ECHO:04/15/13: Study Conclusions  - Study data: Technically adequate study. - Left ventricle: The cavity size was normal. Wall thickness   was increased in a pattern of moderate LVH. Systolic   function was vigorous. The estimated ejection fraction was   in the range of 65% to 70%. Doppler parameters are  consistent with abnormal left ventricular relaxation   (grade 1 diastolic dysfunction). - Aortic valve: Mildly calcified annulus. Trileaflet; mildly   thickened leaflets. Valve area: 1.91cm^2(VTI). Valve area:   2cm^2 (Vmax). - Mitral valve: Moderately calcified annulus. Mildly   thickened leaflets . - Left atrium: The atrium was mildly dilated. - Right ventricle: The cavity size was moderately to   severely dilated. - Right atrium: The atrium was mildly dilated. - Pulmonary arteries: Systolic pressure was moderately   increased. PA peak pressure: 58mm Hg (S). - Inferior vena cava: The vessel was dilated; the   respirophasic diameter changes were blunted (< 50%);   findings are consistent with elevated central venous   pressure.   Assessment/Plan:  1. Acute on chronic diastolic heart failure. Diuresed 1055 since Lasix increased to 60mg  BID. LVEF 65-70% with grade 1 diastolic dysfunction by recent assessment.  2. Chest pain with ambulation-Troponins negative.EKG-NSR with Nonspecific ST-T wave changes & T wave inversion anterior-unchanged from prior tracing. Can consider stress testing as outpatient.  3. COPD exacerbation. She continues on breathing treatments, antibiotics, and steroids. Slowly improving.  4. Hypertension, blood pressure stable       LOS: 7 days    Jacolyn Reedy 04/21/2013, 8:32 AM   Attending note:  Patient seen and examined. Cardiac markers negative and ECG with nonspecific ST-T changes. She has had some exertional chest discomfort in the setting of her lung disease, no clear evidence of ACS. Plan per primary team  is for discharge home today. Keep her on Lasix 60 mg by mouth twice a day, arrange a 1 week followup visit. Consideration can be given to followup outpatient ischemic testing, particularly if symptoms continue.  Jonelle Sidle, M.D., F.A.C.C.

## 2013-04-21 NOTE — Discharge Summary (Signed)
Physician Discharge Summary  JAIDAH LOMAX NWG:[id number] DOB: [birth year] DOA: 04/14/2013  PCP: Toma Deiters, MD  Admit date: 04/14/2013 Discharge date: 04/21/2013  Time spent: 40 minutes  Recommendations for Outpatient Follow-up:  1. Joni Reining NP cardiology 04/29/13 at 2: 30 2. PCP 1 week for evaluation of symptoms. Recommend BMET for evaluation of sodium and potassium level. Recommend CBC for tracking of anemia. May benefit from cancer screening. Instructed to monitor CBG closely while on steroids.  3. Home Health RN and Aid  Discharge Diagnoses:  Principal Problem:   Acute-on-chronic respiratory failure Acute on chronic diastolic congestive heart failure   Active Problems: COPD with exacerbation  Dyspnea Chest pain  Hyponatremia   Leukocytosis  CKD (chronic kidney disease), stage III   COPD (chronic obstructive pulmonary disease)   Morbid obesity   DM type 2 (diabetes mellitus, type 2)   HTN (hypertension), benign    Anemia       Discharge Condition: stable  Diet recommendation: carb modified heart healthy  Filed Weights   04/18/13 0540 04/19/13 0500 04/21/13 0641  Weight: 110.995 kg (244 lb 11.2 oz) 110.496 kg (243 lb 9.6 oz) 124.467 kg (274 lb 6.4 oz)    History of present illness:  Stephanie Combs is a 62 y.o. female with past medical history that includes COPD she is oxygen dependent at home, CHF, diabetes, hypertension, chronic kidney disease, lower extremity edema presented to the emergency department on 04/14/13 with chief complaint of worsening shortness of breath and cough. She reported that approximately 3 weeks prior she developed worsening shortness of breath and she went to [facility]. She was informed that she had "fluid on the lungs". She reported they "got the fluid off of me and sent home". She states that she never felt like her breathing improved. Over the next several days her dyspnea worsened and she developed worsening lower extremity  edema. 3 days prior she developed wheezing and moist nonproductive cough. Associated symptoms included nausea with one episode of emesis, dizziness with position change, intermittent chest pain, 3 episodes of diarrhea. She denied any coffee ground emesis or melena or bright red blood per rectum. She denied any palpitations headache visual disturbances. She reported that chest pain  located anteriorly left and right. She described it as a pressure that is worse with position change. She stated episodes were brief and rated the intensity 8/10. She also indicated that her lower extremity edema worsened particularly on the right leg. In the emergency room initial evaluation significant for sodium of 133, creatinine of 1.63, glucose of 279, proBNP 14,124, white blood cells 11.6. Chest x-ray yielded slightly increased cardiomegaly, worsened pulmonary vascular  congestion and mild interstitial edema consistent with mild-moderate CHF. EKG yielded a normal sinus rhythm with T-wave inversion that was a change when compared to the EKG done in March of this year.   Hospital Course:  Acute on chronic respiratory failure, related to acute on chronic diastolic heart failure in setting of COPD exacerbation. Admitted to tele. Provided with diuretics, nebulizers and steroids. She diuresed well and responded to nebs and steroids. At discharge respiratory status at baseline. Oxygen saturation level >90% on 3L with ambulation. This is her baseline.  Acute on chronic diastolic HF (heart failure): In setting of COPD exacerbation. Provided with IV lasix on admission. Evaluated by cardiology who increased IV lasix. She diuresed 15L over 5 days. Lasix transitioned to po at higher dose than pre-admission. Urine output slightly less since lasix changed to po. Echo  cardiogram shows preserved EF with grade 1 diastolic dysfunction, which is similar to last echo. She has appointment with Joni Reining 04/29/13 for close follow up.    COPD with exacerbation: Provided with nebs every 4 hours and solumedrol on admission. Improved slowly but steadily. At discharge will complete prednisone taper over 5 days and will continue nebulizer treatments at home as pre-admission. She also completed 5 doses of Levaquin. Of note, pt oxygen at home range 2-3L. At discharge respiratory status at baseline. Recommend follow up with PCP 1 week for evaluation of smptoms  Chest pain. Episode CP and SOB when ambulating to BR on 04/20/13. Quickly resolved. Troponins negative x2. Repeat EKG with NSR and some T-wave abnormality. Evaluated by cardiology who opined further observation with activity warranted. She ambulated in hall x3 with anticipated shortness of breath and tachycardia but oxygen saturation level remained >90%. Evaluated by cardiology on day of discharge and agreed with discharge to home. Has follow up appointment as above.    DM type 2 (diabetes mellitus, type 2): uncontrolled. HgA1c 8.3 on admission.  Was provided with SSI on admission and oral agents held on admission. Continued with uncontrolled diabetes likely related to steroids. Oral agents resumed 04/20/13 and steroids with quick taper at discharge. At discharge will increase home insulins to Levimir 45 units and Novolog 25 units with oral agents. Has been instructed to monitor CBG closely while on steroids. Recommend close follow up with PCP for optimal glycemic control.     Hyponatremia: Mild. Recommend BMET on PCP visit to evaluate sodium level.    Leukocytosis: Likely related to steroids. She is afebrile and non-toxic appearing at discharge. Follow up with PCP 1 week.   CKD (chronic kidney disease), stage III: Chart review indicates history of chronic kidney disease. Creatinine improved initially with diuresis. Current creatinine remains close to baseline at discharge.  HTN (hypertension), benign: Stable. SBP range 128-130. Continue diltiazem at discharge.   Anemia, Iron  deficiency. Will start on iron supplementation as an outpatient. She will need cancer screenings to be performed as an outpatient. Recommend PCP appointment 1 week.    Morbid obesity: BMI 38.5. Provided with nutritional consult.    Procedures:  none  Consultations:  Dr. Diona Browner cardiology  Discharge Exam: Filed Vitals:   04/21/13 0641  BP: 122/41  Pulse: 90  Temp: 97.4 F (36.3 C)  Resp: 16    General: obese appears comfortable. NAD Cardiovascular: RRR No murmur gallup or rub. Trace LE edema  Respiratory: BS with good air flow and mild diffuse rhonchi with faint wheeze   Discharge Instructions      Discharge Orders   Future Appointments Provider Department Dept Phone   04/29/2013 2:30 PM Jodelle Gross, NP [facility] [place] [phone]   05/31/2013 3:40 PM Antoine Poche, MD [facility] [place] [phone]   Future Orders Complete By Expires   Diet - low sodium heart healthy  As directed    Discharge instructions  As directed    Comments:     Take medications as directed. Follow up with Joni Reining with cardiology 12/19 Follow up with PCP 12/22   Increase activity slowly  As directed        Medication List    STOP taking these medications       torsemide 20 MG tablet  Commonly known as:  DEMADEX      TAKE these medications       albuterol 108 (90 BASE) MCG/ACT inhaler  Commonly known as:  PROAIR HFA  Inhale 1 puff into the lungs every 4 (four) hours as needed. For shortness of breath     alprazolam 2 MG tablet  Commonly known as:  XANAX  Take 1-2 mg by mouth 4 (four) times daily as needed. Sometimes patient only takes 1mg , and she does not take 4 daily unless needed for anxiety     alum & mag hydroxide-simeth 200-200-20 MG/5ML suspension  Commonly known as:  MAALOX/MYLANTA  Take 30 mLs by mouth every 6 (six) hours as needed for indigestion or heartburn (dyspepsia).     aspirin 325 MG EC tablet  Take 325 mg by mouth  daily. Ecotrin OTC     benazepril 10 MG tablet  Commonly known as:  LOTENSIN  Take 10 mg by mouth daily.     diltiazem 240 MG 24 hr capsule  Commonly known as:  CARDIZEM CD  Take 240 mg by mouth daily.     FISH OIL PO  Take 1 capsule by mouth daily.     furosemide 20 MG tablet  Commonly known as:  LASIX  Take 3 tablets (60 mg total) by mouth 2 (two) times daily.     gabapentin 600 MG tablet  Commonly known as:  NEURONTIN  Take 600 mg by mouth 4 (four) times daily.     glyBURIDE-metformin 5-500 MG per tablet  Commonly known as:  GLUCOVANCE  Take 2 tablets by mouth 2 (two) times daily with a meal.     guaiFENesin-dextromethorphan 100-10 MG/5ML syrup  Commonly known as:  ROBITUSSIN DM  Take 5 mLs by mouth every 4 (four) hours as needed for cough.     HYDROcodone-acetaminophen 5-325 MG per tablet  Commonly known as:  NORCO/VICODIN  Take 1 tablet by mouth every 4 (four) hours as needed for pain.     insulin aspart 100 UNIT/ML injection  Commonly known as:  novoLOG  Inject 25 Units into the skin daily. Takes back to back with Levemir     insulin detemir 100 UNIT/ML injection  Commonly known as:  LEVEMIR  Inject 0.45 mLs (45 Units total) into the skin daily.     levothyroxine 125 MCG tablet  Commonly known as:  SYNTHROID, LEVOTHROID  Take 250 mcg by mouth daily before breakfast.     meclizine 25 MG tablet  Commonly known as:  ANTIVERT  Take 25 mg by mouth 2 (two) times daily.     pantoprazole 40 MG tablet  Commonly known as:  PROTONIX  Take 1 tablet (40 mg total) by mouth 2 (two) times daily.     potassium chloride 10 MEQ tablet  Commonly known as:  K-DUR  Take 20 mEq by mouth 2 (two) times daily.     predniSONE 10 MG tablet  Commonly known as:  DELTASONE  Take 5 tabs on 12/12, take 4 tabs on 12/13 take 3 tabs 12/14 take 2 tabs 12/15 take 1 tab 12/16 then stop.       Allergies  Allergen Reactions  . Dilaudid [Hydromorphone Hcl]   . Oxycontin [Oxycodone]   .  Reglan [Metoclopramide]   . Valium [Diazepam]   . Vistaril [Hydroxyzine Hcl]    Follow-up Information   Follow up with Joni Reining, NP On 04/29/2013. (appointment at 2:30pm)    Specialty:  Nurse Practitioner   Contact information:   [address] [place] [address] [phone]       Follow up with Toma Deiters, MD On 05/02/2013. (has appointment at 12:30. recommend BMET  and CBC to track electrolytes and Hg. recommend evaluation of CBG's as insulin increased slightly at discharge )    Specialty:  Internal Medicine   Contact information:   [address] [place] [address] [phone]        The results of significant diagnostics from this hospitalization (including imaging, microbiology, ancillary and laboratory) are listed below for reference.    Significant Diagnostic Studies: Dg Chest 2 View  04/15/2013   CLINICAL DATA:  CHF/ COPD  EXAM: CHEST  2 VIEW  COMPARISON:  04/14/2013  FINDINGS: COPD. Negative for heart failure. Improved aeration of the lung bases compared with the prior study. Negative for edema or effusion.  IMPRESSION: Overall improved aeration from the prior study. No acute cardiopulmonary abnormality.   Electronically Signed   By: Marlan Palau M.D.   On: 04/15/2013 14:46   Dg Chest Portable 1 View  04/14/2013   CLINICAL DATA:  Short of breath  EXAM: PORTABLE CHEST - 1 VIEW  COMPARISON:  Prior chest x-ray 04/05/2013  FINDINGS: The patient is markedly rotated toward the right. Given rotation and differences in technique, the heart appears slightly enlarged compared to prior. Additionally, there is increased pulmonary vascular congestion now with mild interstitial edema. No large pleural effusion identified. No pneumothorax. A skin fold projects over the left lung field. No acute osseous abnormality.  IMPRESSION: Slightly increased cardiomegaly, worsened pulmonary vascular congestion and mild interstitial edema consistent with mild-moderate CHF.    Electronically Signed   By: Malachy Moan M.D.   On: 04/14/2013 13:49    Microbiology: Recent Results (from the past 240 hour(s))  MRSA PCR SCREENING     Status: None   Collection Time    04/15/13  1:00 AM      Result Value Range Status   MRSA by PCR NEGATIVE  NEGATIVE Final   Comment:            The GeneXpert MRSA Assay (FDA     approved for NASAL specimens     only), is one component of a     comprehensive MRSA colonization     surveillance program. It is not     intended to diagnose MRSA     infection nor to guide or     monitor treatment for     MRSA infections.     Labs: Basic Metabolic Panel:  Recent Labs Lab 04/17/13 0552 04/18/13 0634 04/19/13 0500 04/20/13 0611 04/21/13 0531  NA 140 137 137 137 133*  K 4.5 4.3 4.1 4.0 4.0  CL 99 91* 91* 92* 91*  CO2 34* 39* 41* 37* 33*  GLUCOSE 227* 303* 265* 260* 358*  BUN 42* 33* 35* 33* 34*  CREATININE 1.27* 1.03 1.17* 1.15* 1.13*  CALCIUM 8.8 9.0 8.7 8.6 8.5   Liver Function Tests: No results found for this basename: AST, ALT, ALKPHOS, BILITOT, PROT, ALBUMIN,  in the last 168 hours No results found for this basename: LIPASE, AMYLASE,  in the last 168 hours No results found for this basename: AMMONIA,  in the last 168 hours CBC:  Recent Labs Lab 04/14/13 1315 04/14/13 2329 04/15/13 0413 04/19/13 0500 04/20/13 0611 04/21/13 0531  WBC 11.6* 9.3 8.6 11.8* 11.3* 13.0*  NEUTROABS 8.7*  --   --   --   --   --   HGB 11.6* 10.8* 10.5* 12.4 12.0 11.8*  HCT 36.1 34.4* 33.5* 37.3 37.1 36.2  MCV 88.0 91.2 88.4 85.9 85.7 86.4  PLT 251 266 283  434* 412* 415*   Cardiac Enzymes:  Recent Labs Lab 04/14/13 1315 04/14/13 1942 04/15/13 0413 04/20/13 0611 04/20/13 1339  TROPONINI <0.30 <0.30 <0.30 <0.30 <0.30   BNP: BNP (last 3 results)  Recent Labs  04/14/13 1315  PROBNP 14124.0*   CBG:  Recent Labs Lab 04/20/13 1635 04/20/13 2057 04/21/13 0141 04/21/13 0730 04/21/13 1145  GLUCAP 295* 235* 301*  352* 104*       Signed:  Brittnay Pigman M  [facility] 04/21/2013, 12:23 PM

## 2013-04-28 ENCOUNTER — Telehealth: Payer: Self-pay

## 2013-04-28 NOTE — Telephone Encounter (Signed)
Clarified discharge summary orders regarding meds with pt.patient to Stop torsemide,continue furosemide (lasix) rescheduled tomorrows apt

## 2013-04-28 NOTE — Telephone Encounter (Signed)
Patient called and states lost papers from hospital, not sure of medication she was supposed to start, she thinks Cardizem.  Please call.  [phone].

## 2013-04-29 ENCOUNTER — Encounter: Payer: Medicare Other | Admitting: Adult Health

## 2013-05-03 ENCOUNTER — Encounter: Payer: Medicare Other | Admitting: Adult Health

## 2013-05-03 NOTE — Progress Notes (Signed)
HPI: [name] is a 62 year old patient of Dr. Dorna Mai whereupon for ongoing assessment and management of acute on chronic diastolic CHF, hypertension, with multiple cardiovascular risk factors. The patient was seen during hospitalization in December of 2014 in the setting of decompensated CHF. She also had a COPD exacerbation. She was treated with diuretics nebulizers and steroids. She was complaining of chest pain the cardiac enzymes are found be negative. The patient was diuresis approximately 30 pounds during hospitalization with admission weight of 274 pounds in a discharge weight of 244 pounds.  Allergies  Allergen Reactions  . Dilaudid [Hydromorphone Hcl]   . Oxycontin [Oxycodone]   . Reglan [Metoclopramide]   . Valium [Diazepam]   . Vistaril [Hydroxyzine Hcl]     Current Outpatient Prescriptions  Medication Sig Dispense Refill  . albuterol (PROAIR HFA) 108 (90 BASE) MCG/ACT inhaler Inhale 1 puff into the lungs every 4 (four) hours as needed. For shortness of breath  1 Inhaler  1  . alprazolam (XANAX) 2 MG tablet Take 1-2 mg by mouth 4 (four) times daily as needed. Sometimes patient only takes 1mg , and she does not take 4 daily unless needed for anxiety      . alum & mag hydroxide-simeth (MAALOX/MYLANTA) 200-200-20 MG/5ML suspension Take 30 mLs by mouth every 6 (six) hours as needed for indigestion or heartburn (dyspepsia).  355 mL  0  . aspirin 325 MG EC tablet Take 325 mg by mouth daily. Ecotrin OTC      . benazepril (LOTENSIN) 10 MG tablet Take 10 mg by mouth daily.      Marland Kitchen diltiazem (CARDIZEM CD) 240 MG 24 hr capsule Take 240 mg by mouth daily.      . furosemide (LASIX) 20 MG tablet Take 3 tablets (60 mg total) by mouth 2 (two) times daily.  180 tablet  1  . gabapentin (NEURONTIN) 600 MG tablet Take 600 mg by mouth 4 (four) times daily.      Marland Kitchen glyBURIDE-metformin (GLUCOVANCE) 5-500 MG per tablet Take 2 tablets by mouth 2 (two) times daily with a meal.      .  guaiFENesin-dextromethorphan (ROBITUSSIN DM) 100-10 MG/5ML syrup Take 5 mLs by mouth every 4 (four) hours as needed for cough.  118 mL  0  . HYDROcodone-acetaminophen (NORCO/VICODIN) 5-325 MG per tablet Take 1 tablet by mouth every 4 (four) hours as needed for pain.      Marland Kitchen insulin aspart (NOVOLOG) 100 UNIT/ML injection Inject 25 Units into the skin daily. Takes back to back with Levemir  1 vial  12  . insulin detemir (LEVEMIR) 100 UNIT/ML injection Inject 0.45 mLs (45 Units total) into the skin daily.  10 mL  12  . levothyroxine (SYNTHROID, LEVOTHROID) 125 MCG tablet Take 250 mcg by mouth daily before breakfast.      . meclizine (ANTIVERT) 25 MG tablet Take 25 mg by mouth 2 (two) times daily.      . Omega-3 Fatty Acids (FISH OIL PO) Take 1 capsule by mouth daily.      . pantoprazole (PROTONIX) 40 MG tablet Take 1 tablet (40 mg total) by mouth 2 (two) times daily.  60 tablet  0  . potassium chloride (K-DUR) 10 MEQ tablet Take 20 mEq by mouth 2 (two) times daily.      . predniSONE (DELTASONE) 10 MG tablet Take 5 tabs on 12/12, take 4 tabs on 12/13 take 3 tabs 12/14 take 2 tabs 12/15 take 1 tab 12/16 then stop.  15 tablet  0   No current facility-administered medications for this visit.    Past Medical History  Diagnosis Date  . Hypertension   . Diabetes mellitus without complication   . COPD (chronic obstructive pulmonary disease)   . CHF (congestive heart failure)     Diastolic  . Anxiety   . Chronic back pain   . Hypothyroidism   . Pedal edema   . On home O2     2.5 L N/C prn    Past Surgical History  Procedure Laterality Date  . Tubal ligation    . Hernia repair      ROS: PHYSICAL EXAM There were no vitals taken for this visit.  EKG:  ASSESSMENT AND PLAN

## 2013-05-16 ENCOUNTER — Encounter: Payer: Medicare Other | Admitting: Adult Health

## 2013-05-25 ENCOUNTER — Inpatient Hospital Stay (HOSPITAL_COMMUNITY)
Admission: EM | Admit: 2013-05-25 | Discharge: 2013-05-30 | DRG: 190 | Disposition: A | Discharge status: Home / Self Care | Payer: Medicare Other | Attending: Internal Medicine | Admitting: Internal Medicine

## 2013-05-25 DIAGNOSIS — Z9981 Dependence on supplemental oxygen: Secondary | ICD-10-CM

## 2013-05-25 DIAGNOSIS — N183 Chronic kidney disease, stage 3 unspecified: Secondary | ICD-10-CM

## 2013-05-25 DIAGNOSIS — E162 Hypoglycemia, unspecified: Secondary | ICD-10-CM

## 2013-05-25 DIAGNOSIS — J9620 Acute and chronic respiratory failure, unspecified whether with hypoxia or hypercapnia: Secondary | ICD-10-CM | POA: Diagnosis present

## 2013-05-25 DIAGNOSIS — E119 Type 2 diabetes mellitus without complications: Secondary | ICD-10-CM | POA: Diagnosis present

## 2013-05-25 DIAGNOSIS — R079 Chest pain, unspecified: Secondary | ICD-10-CM | POA: Diagnosis present

## 2013-05-25 DIAGNOSIS — I509 Heart failure, unspecified: Secondary | ICD-10-CM

## 2013-05-25 DIAGNOSIS — F17200 Nicotine dependence, unspecified, uncomplicated: Secondary | ICD-10-CM | POA: Diagnosis present

## 2013-05-25 DIAGNOSIS — I5033 Acute on chronic diastolic (congestive) heart failure: Secondary | ICD-10-CM

## 2013-05-25 DIAGNOSIS — I5032 Chronic diastolic (congestive) heart failure: Secondary | ICD-10-CM | POA: Diagnosis present

## 2013-05-25 DIAGNOSIS — E875 Hyperkalemia: Secondary | ICD-10-CM | POA: Diagnosis not present

## 2013-05-25 DIAGNOSIS — Z66 Do not resuscitate: Secondary | ICD-10-CM | POA: Diagnosis present

## 2013-05-25 DIAGNOSIS — J449 Chronic obstructive pulmonary disease, unspecified: Secondary | ICD-10-CM

## 2013-05-25 DIAGNOSIS — G43909 Migraine, unspecified, not intractable, without status migrainosus: Secondary | ICD-10-CM | POA: Diagnosis present

## 2013-05-25 DIAGNOSIS — J209 Acute bronchitis, unspecified: Secondary | ICD-10-CM

## 2013-05-25 DIAGNOSIS — Z79899 Other long term (current) drug therapy: Secondary | ICD-10-CM

## 2013-05-25 DIAGNOSIS — D72829 Elevated white blood cell count, unspecified: Secondary | ICD-10-CM

## 2013-05-25 DIAGNOSIS — D649 Anemia, unspecified: Secondary | ICD-10-CM

## 2013-05-25 DIAGNOSIS — G8929 Other chronic pain: Secondary | ICD-10-CM | POA: Diagnosis present

## 2013-05-25 DIAGNOSIS — F3289 Other specified depressive episodes: Secondary | ICD-10-CM | POA: Diagnosis present

## 2013-05-25 DIAGNOSIS — Z7982 Long term (current) use of aspirin: Secondary | ICD-10-CM

## 2013-05-25 DIAGNOSIS — F411 Generalized anxiety disorder: Secondary | ICD-10-CM | POA: Diagnosis present

## 2013-05-25 DIAGNOSIS — F329 Major depressive disorder, single episode, unspecified: Secondary | ICD-10-CM | POA: Diagnosis present

## 2013-05-25 DIAGNOSIS — R0600 Dyspnea, unspecified: Secondary | ICD-10-CM

## 2013-05-25 DIAGNOSIS — N179 Acute kidney failure, unspecified: Secondary | ICD-10-CM

## 2013-05-25 DIAGNOSIS — M549 Dorsalgia, unspecified: Secondary | ICD-10-CM | POA: Diagnosis present

## 2013-05-25 DIAGNOSIS — R600 Localized edema: Secondary | ICD-10-CM | POA: Diagnosis present

## 2013-05-25 DIAGNOSIS — N390 Urinary tract infection, site not specified: Secondary | ICD-10-CM

## 2013-05-25 DIAGNOSIS — R519 Headache, unspecified: Secondary | ICD-10-CM | POA: Diagnosis not present

## 2013-05-25 DIAGNOSIS — T380X5A Adverse effect of glucocorticoids and synthetic analogues, initial encounter: Secondary | ICD-10-CM | POA: Diagnosis present

## 2013-05-25 DIAGNOSIS — E871 Hypo-osmolality and hyponatremia: Secondary | ICD-10-CM | POA: Diagnosis present

## 2013-05-25 DIAGNOSIS — Z794 Long term (current) use of insulin: Secondary | ICD-10-CM

## 2013-05-25 DIAGNOSIS — I10 Essential (primary) hypertension: Secondary | ICD-10-CM | POA: Diagnosis present

## 2013-05-25 DIAGNOSIS — E039 Hypothyroidism, unspecified: Secondary | ICD-10-CM | POA: Diagnosis present

## 2013-05-25 DIAGNOSIS — J441 Chronic obstructive pulmonary disease with (acute) exacerbation: Principal | ICD-10-CM | POA: Diagnosis present

## 2013-05-25 DIAGNOSIS — I959 Hypotension, unspecified: Secondary | ICD-10-CM

## 2013-05-25 DIAGNOSIS — R51 Headache: Secondary | ICD-10-CM

## 2013-05-25 DIAGNOSIS — E669 Obesity, unspecified: Secondary | ICD-10-CM | POA: Diagnosis present

## 2013-05-25 DIAGNOSIS — Z6841 Body Mass Index (BMI) 40.0 and over, adult: Secondary | ICD-10-CM

## 2013-05-25 NOTE — ED Provider Notes (Signed)
CSN: [id number]     Arrival date & time 05/25/13  2338 History  This chart was scribed for Stephanie Lower, MD by Elby Beck, ED Scribe. This patient was seen in room APA15/APA15 and the patient's care was started at 11:42 PM.   Chief Complaint  Patient presents with  . Chest Pain    did not take her xanax because she did not want to get addicted per ems  . Leg Swelling    right leg swollen  . Depression    Patient is a 63 y.o. female presenting with chest pain. The history is provided by the patient. No language interpreter was used.  Chest Pain Pain location:  Unable to specify Pain quality: throbbing   Pain radiates to:  Neck and L shoulder Pain radiates to the back: no   Pain severity:  Moderate Onset quality:  Gradual Duration:  1 hour Timing:  Constant Progression:  Worsening Chronicity:  New Relieved by:  None tried Worsened by:  Nothing tried Ineffective treatments:  None tried Associated symptoms: headache and nausea   Associated symptoms: no fever, no numbness, not vomiting and no weakness     HPI Comments: Stephanie Combs is a 63 y.o. female with a history of DM, HTN, COPD and CHF who presents to the Emergency Department complaining of "throbbing, 9/10" chest pain that radiates to her neck and left shoulder onset about 1 hour ago. She also states that she is having right leg pain with associated increased swelling today. She denies any injury to have onset any of her pain. She also states that she is having a "10/10" migraine today. She further states that she has been having associated nausea, dry heaves, congestion, chills, diarrhea and sore throat. She states that she is SOB at baseline, and she is on 2 L of supplemental oxygen at home. She states that she did not receive this season's flu vaccine. She denies fever, emesis, weakness, numbness or any other symptoms.    Past Medical History  Diagnosis Date  . Hypertension   . Diabetes mellitus without complication   .  COPD (chronic obstructive pulmonary disease)   . CHF (congestive heart failure)     Diastolic  . Anxiety   . Chronic back pain   . Hypothyroidism   . Pedal edema   . On home O2     2.5 L N/C prn   Past Surgical History  Procedure Laterality Date  . Tubal ligation    . Hernia repair     No family history on file. History  Substance Use Topics  . Smoking status: Current Every Day Smoker -- 0.25 packs/day    Types: Cigarettes  . Smokeless tobacco: Not on file  . Alcohol Use: No   OB History   Grav Para Term Preterm Abortions TAB SAB Ect Mult Living                 Review of Systems  Constitutional: Positive for chills. Negative for fever.  HENT: Positive for congestion and sore throat.   Cardiovascular: Positive for chest pain and leg swelling.  Gastrointestinal: Positive for nausea and diarrhea. Negative for vomiting.  Musculoskeletal: Positive for arthralgias (left shoulder) and neck pain.  Neurological: Positive for headaches. Negative for weakness and numbness.  All other systems reviewed and are negative.     Allergies  Dilaudid; Oxycontin; Reglan; Valium; and Vistaril  Home Medications   Current Outpatient Rx  Name  Route  Sig  Dispense  Refill  . albuterol (PROAIR HFA) 108 (90 BASE) MCG/ACT inhaler   Inhalation   Inhale 1 puff into the lungs every 4 (four) hours as needed. For shortness of breath   1 Inhaler   1   . alprazolam (XANAX) 2 MG tablet   Oral   Take 1-2 mg by mouth 4 (four) times daily as needed. Sometimes patient only takes 1mg , and she does not take 4 daily unless needed for anxiety         . alum & mag hydroxide-simeth (MAALOX/MYLANTA) 200-200-20 MG/5ML suspension   Oral   Take 30 mLs by mouth every 6 (six) hours as needed for indigestion or heartburn (dyspepsia).   355 mL   0   . aspirin 325 MG EC tablet   Oral   Take 325 mg by mouth daily. Ecotrin OTC         . benazepril (LOTENSIN) 10 MG tablet   Oral   Take 10 mg by mouth  daily.         Marland Kitchen diltiazem (CARDIZEM CD) 240 MG 24 hr capsule   Oral   Take 240 mg by mouth daily.         . furosemide (LASIX) 20 MG tablet   Oral   Take 3 tablets (60 mg total) by mouth 2 (two) times daily.   180 tablet   1   . gabapentin (NEURONTIN) 600 MG tablet   Oral   Take 600 mg by mouth 4 (four) times daily.         Marland Kitchen glyBURIDE-metformin (GLUCOVANCE) 5-500 MG per tablet   Oral   Take 2 tablets by mouth 2 (two) times daily with a meal.         . guaiFENesin-dextromethorphan (ROBITUSSIN DM) 100-10 MG/5ML syrup   Oral   Take 5 mLs by mouth every 4 (four) hours as needed for cough.   118 mL   0   . HYDROcodone-acetaminophen (NORCO/VICODIN) 5-325 MG per tablet   Oral   Take 1 tablet by mouth every 4 (four) hours as needed for pain.         Marland Kitchen insulin aspart (NOVOLOG) 100 UNIT/ML injection   Subcutaneous   Inject 25 Units into the skin daily. Takes back to back with Levemir   1 vial   12   . insulin detemir (LEVEMIR) 100 UNIT/ML injection   Subcutaneous   Inject 0.45 mLs (45 Units total) into the skin daily.   10 mL   12   . levothyroxine (SYNTHROID, LEVOTHROID) 125 MCG tablet   Oral   Take 250 mcg by mouth daily before breakfast.         . meclizine (ANTIVERT) 25 MG tablet   Oral   Take 25 mg by mouth 2 (two) times daily.         . Omega-3 Fatty Acids (FISH OIL PO)   Oral   Take 1 capsule by mouth daily.         . pantoprazole (PROTONIX) 40 MG tablet   Oral   Take 1 tablet (40 mg total) by mouth 2 (two) times daily.   60 tablet   0   . potassium chloride (K-DUR) 10 MEQ tablet   Oral   Take 20 mEq by mouth 2 (two) times daily.         . predniSONE (DELTASONE) 10 MG tablet      Take 5 tabs on 12/12, take 4 tabs on 12/13 take 3 tabs 12/14 take  2 tabs 12/15 take 1 tab 12/16 then stop.   15 tablet   0    Triage Vitals: BP 103/52  Pulse 81  Temp(Src) 98 F (36.7 C) (Oral)  Ht 5\' 7"  (1.702 m)  Wt 238 lb (107.956 kg)  BMI 37.27  kg/m2  SpO2 97%  Physical Exam  Nursing note and vitals reviewed. Constitutional: She is oriented to person, place, and time. She appears well-developed and well-nourished. No distress.  HENT:  Head: Normocephalic and atraumatic.  Eyes: EOM are normal.  Neck: Neck supple. No tracheal deviation present.  Cardiovascular: Normal rate.   Pulmonary/Chest: Effort normal. No respiratory distress. She has wheezes.  Expiratory wheezes bilaterally.   Musculoskeletal: Normal range of motion. She exhibits edema.  Severe bilateral Combs extremity edema. No calf tenderness.   Neurological: She is alert and oriented to person, place, and time. No cranial nerve deficit.  Cranial nerves intact. Speech is clear. No unilateral deficits.   Skin: Skin is warm and dry.  Psychiatric: She has a normal mood and affect. Her behavior is normal.    ED Course  Procedures (including critical care time)  DIAGNOSTIC STUDIES: Oxygen Saturation is 97% on RA, normal by my interpretation.    COORDINATION OF CARE: 11:50 PM- Discussed clinical suspicion that pt may have the flu. Also discussed possible plan for overnight admission. Pt advised of plan for treatment and pt agrees.  Labs Review Labs Reviewed  CBC - Abnormal; Notable for the following:    RDW 15.8 (*)    All other components within normal limits  COMPREHENSIVE METABOLIC PANEL - Abnormal; Notable for the following:    Glucose, Bld 159 (*)    BUN 31 (*)    Creatinine, Ser 1.60 (*)    Albumin 3.2 (*)    Total Bilirubin 0.2 (*)    GFR calc non Af Amer 33 (*)    GFR calc Af Amer 39 (*)    All other components within normal limits  PRO B NATRIURETIC PEPTIDE - Abnormal; Notable for the following:    Pro B Natriuretic peptide (BNP) 135.9 (*)    All other components within normal limits  TROPONIN I  INFLUENZA PANEL BY PCR (TYPE A & B, H1N1)   Imaging Review Dg Chest 2 View  05/26/2013   CLINICAL DATA:  Shortness of breath. generalized weakness.  Bilateral Combs extremity edema. Smoker with current history of COPD, hypertension, and diabetes.  EXAM: CHEST  2 VIEW  COMPARISON:  04/15/2013, 08/07/2012 [facility] and 04/05/2013 [facility].  FINDINGS: Suboptimal inspiration due to body habitus accounts for crowded bronchovascular markings diffusely and bibasilar atelectasis, and also accentuates the cardiac silhouette. Taking this into account, cardiac silhouette upper normal in size, unchanged. Thoracic aorta mildly atherosclerotic, unchanged. Hilar and mediastinal contours otherwise unremarkable. Lungs otherwise clear. No localized airspace consolidation. No pleural effusions. No pneumothorax. Normal pulmonary vascularity. Degenerative changes and DISH involving the thoracic spine.  IMPRESSION: Suboptimal inspiration accounts for atelectasis in the Combs lobes. No acute cardiopulmonary disease otherwise.   Electronically Signed   By: Evangeline Dakin M.D.   On: 05/26/2013 01:33     Date: 05/26/2013  Rate: 80  Rhythm: normal sinus rhythm  QRS Axis: normal  Intervals: normal  ST/T Wave abnormalities: nonspecific ST changes  Conduction Disutrbances:none  Narrative Interpretation:   Old EKG Reviewed: unchanged  IV morphine, Zofran, aspirin nitroglycerin for chest pain IV Solu-Medrol, albuterol for wheezes  On recheck still wheezing and symptomatic. Albuterol repeated and  medicine consult for admission. Discussed with Dr. Darrick Meigs, plan admit telemetry  MDM  Diagnosis: COPD exacerbation  Evaluated with EKG, labs and chest x-ray reviewed as above. Influenza PCR pending. Treated with steroids, breathing treatment and IV narcotics Medical admission  I personally performed the services described in this documentation, which was scribed in my presence. The recorded information has been reviewed and is accurate.     Stephanie Lower, MD 05/26/13 [phone]

## 2013-05-26 ENCOUNTER — Emergency Department (HOSPITAL_COMMUNITY): Payer: Medicare Other

## 2013-05-26 ENCOUNTER — Encounter (HOSPITAL_COMMUNITY): Payer: Self-pay | Admitting: Unknown Physician Specialty

## 2013-05-26 DIAGNOSIS — I5033 Acute on chronic diastolic (congestive) heart failure: Secondary | ICD-10-CM

## 2013-05-26 DIAGNOSIS — E039 Hypothyroidism, unspecified: Secondary | ICD-10-CM

## 2013-05-26 DIAGNOSIS — E119 Type 2 diabetes mellitus without complications: Secondary | ICD-10-CM

## 2013-05-26 DIAGNOSIS — I509 Heart failure, unspecified: Secondary | ICD-10-CM

## 2013-05-26 DIAGNOSIS — N183 Chronic kidney disease, stage 3 unspecified: Secondary | ICD-10-CM

## 2013-05-26 DIAGNOSIS — R079 Chest pain, unspecified: Secondary | ICD-10-CM

## 2013-05-26 DIAGNOSIS — J441 Chronic obstructive pulmonary disease with (acute) exacerbation: Secondary | ICD-10-CM | POA: Diagnosis present

## 2013-05-26 DIAGNOSIS — R51 Headache: Secondary | ICD-10-CM

## 2013-05-26 DIAGNOSIS — N179 Acute kidney failure, unspecified: Secondary | ICD-10-CM

## 2013-05-26 DIAGNOSIS — I10 Essential (primary) hypertension: Secondary | ICD-10-CM

## 2013-05-26 DIAGNOSIS — R519 Headache, unspecified: Secondary | ICD-10-CM | POA: Diagnosis not present

## 2013-05-26 LAB — TROPONIN I
Troponin I: <0.3 ng/mL (ref <0.30)
Troponin I: <0.3 ng/mL (ref <0.30)
Troponin I: <0.3 ng/mL (ref <0.30)
Troponin I: <0.3 ng/mL (ref <0.30)

## 2013-05-26 LAB — CBC
HCT: 33.7 % — ABNORMAL LOW (ref 36.0–46.0)
HCT: 36.1 % (ref 36.0–46.0)
Hemoglobin: 11.2 g/dL — ABNORMAL LOW (ref 12.0–15.0)
Hemoglobin: 12.1 g/dL (ref 12.0–15.0)
MCH: 29.4 pg (ref 26.0–34.0)
MCH: 29.7 pg (ref 26.0–34.0)
MCHC: 33.2 g/dL (ref 30.0–36.0)
MCHC: 33.5 g/dL (ref 30.0–36.0)
MCV: 88.5 fL (ref 78.0–100.0)
MCV: 88.7 fL (ref 78.0–100.0)
Platelets: 328 10*3/uL (ref 150–400)
Platelets: 343 10*3/uL (ref 150–400)
RBC: 3.81 MIL/uL — ABNORMAL LOW (ref 3.87–5.11)
RBC: 4.07 MIL/uL (ref 3.87–5.11)
RDW: 15.3 % (ref 11.5–15.5)
RDW: 15.8 % — ABNORMAL HIGH (ref 11.5–15.5)
WBC: 10.2 10*3/uL (ref 4.0–10.5)
WBC: 6 10*3/uL (ref 4.0–10.5)

## 2013-05-26 LAB — COMPREHENSIVE METABOLIC PANEL
ALT: 6 U/L (ref 0–35)
ALT: 6 U/L (ref 0–35)
AST: 12 U/L (ref 0–37)
AST: 13 U/L (ref 0–37)
Albumin: 3.1 g/dL — ABNORMAL LOW (ref 3.5–5.2)
Albumin: 3.2 g/dL — ABNORMAL LOW (ref 3.5–5.2)
Alkaline Phosphatase: 114 U/L (ref 39–117)
Alkaline Phosphatase: 116 U/L (ref 39–117)
BUN: 31 mg/dL — ABNORMAL HIGH (ref 6–23)
BUN: 33 mg/dL — ABNORMAL HIGH (ref 6–23)
CO2: 23 mEq/L (ref 19–32)
CO2: 28 mEq/L (ref 19–32)
Calcium: 8.5 mg/dL (ref 8.4–10.5)
Calcium: 8.7 mg/dL (ref 8.4–10.5)
Chloride: 95 mEq/L — ABNORMAL LOW (ref 96–112)
Chloride: 96 mEq/L (ref 96–112)
Creatinine, Ser: 1.34 mg/dL — ABNORMAL HIGH (ref 0.50–1.10)
Creatinine, Ser: 1.6 mg/dL — ABNORMAL HIGH (ref 0.50–1.10)
GFR calc Af Amer: 39 mL/min — ABNORMAL LOW (ref >90)
GFR calc Af Amer: 48 mL/min — ABNORMAL LOW (ref >90)
GFR calc non Af Amer: 33 mL/min — ABNORMAL LOW (ref >90)
GFR calc non Af Amer: 41 mL/min — ABNORMAL LOW (ref >90)
Glucose, Bld: 159 mg/dL — ABNORMAL HIGH (ref 70–99)
Glucose, Bld: 449 mg/dL — ABNORMAL HIGH (ref 70–99)
Potassium: 4.8 mEq/L (ref 3.7–5.3)
Potassium: 5 mEq/L (ref 3.7–5.3)
Sodium: 134 mEq/L — ABNORMAL LOW (ref 137–147)
Sodium: 138 mEq/L (ref 137–147)
Total Bilirubin: 0.2 mg/dL — ABNORMAL LOW (ref 0.3–1.2)
Total Bilirubin: 0.2 mg/dL — ABNORMAL LOW (ref 0.3–1.2)
Total Protein: 6.6 g/dL (ref 6.0–8.3)
Total Protein: 6.7 g/dL (ref 6.0–8.3)

## 2013-05-26 LAB — GLUCOSE, CAPILLARY
Glucose-Capillary: 395 mg/dL — ABNORMAL HIGH (ref 70–99)
Glucose-Capillary: 379 mg/dL — ABNORMAL HIGH (ref 70–99)
Glucose-Capillary: 327 mg/dL — ABNORMAL HIGH (ref 70–99)
Glucose-Capillary: 355 mg/dL — ABNORMAL HIGH (ref 70–99)

## 2013-05-26 LAB — INFLUENZA PANEL BY PCR (TYPE A & B)
H1N1 flu by pcr: NOT DETECTED
Influenza A By PCR: NEGATIVE
Influenza B By PCR: NEGATIVE

## 2013-05-26 LAB — PRO B NATRIURETIC PEPTIDE: Pro B Natriuretic peptide (BNP): 135.9 pg/mL — ABNORMAL HIGH (ref 0–125)

## 2013-05-26 MED ORDER — IPRATROPIUM-ALBUTEROL 0.5-2.5 (3) MG/3ML IN SOLN
3.0000 mL | Freq: Four times a day (QID) | RESPIRATORY_TRACT | Status: DC
  Administered 2013-05-26 – 2013-05-30 (×12): 3 mL via RESPIRATORY_TRACT
  Filled 2013-05-26 (×15): qty 3

## 2013-05-26 MED ORDER — ASPIRIN 81 MG PO CHEW
CHEWABLE_TABLET | ORAL | Status: AC
  Filled 2013-05-26: qty 4

## 2013-05-26 MED ORDER — INSULIN ASPART 100 UNIT/ML ~~LOC~~ SOLN
0.0000 [IU] | Freq: Three times a day (TID) | SUBCUTANEOUS | Status: DC
Start: 2013-05-26 — End: 2013-05-27
  Administered 2013-05-26 (×2): 9 [IU] via SUBCUTANEOUS
  Administered 2013-05-26: 7 [IU] via SUBCUTANEOUS
  Administered 2013-05-27: 5 [IU] via SUBCUTANEOUS
  Administered 2013-05-27: 9 [IU] via SUBCUTANEOUS

## 2013-05-26 MED ORDER — SALINE SPRAY 0.65 % NA SOLN
NASAL | Status: AC
Start: 2013-05-26 — End: 2013-05-26
  Filled 2013-05-26: qty 44

## 2013-05-26 MED ORDER — ALBUTEROL SULFATE (2.5 MG/3ML) 0.083% IN NEBU: 5.0000 mg | INHALATION_SOLUTION | Freq: Once | RESPIRATORY_TRACT | Status: DC

## 2013-05-26 MED ORDER — MECLIZINE HCL 12.5 MG PO TABS
25.0000 mg | ORAL_TABLET | Freq: Two times a day (BID) | ORAL | Status: DC
  Administered 2013-05-26 – 2013-05-30 (×9): 25 mg via ORAL
  Filled 2013-05-26 (×9): qty 2

## 2013-05-26 MED ORDER — ALBUTEROL SULFATE (2.5 MG/3ML) 0.083% IN NEBU
5.0000 mg | INHALATION_SOLUTION | Freq: Once | RESPIRATORY_TRACT | Status: AC
  Administered 2013-05-26: 5 mg via RESPIRATORY_TRACT

## 2013-05-26 MED ORDER — INSULIN DETEMIR 100 UNIT/ML ~~LOC~~ SOLN
50.0000 [IU] | Freq: Every day | SUBCUTANEOUS | Status: DC
  Administered 2013-05-26 – 2013-05-27 (×2): 50 [IU] via SUBCUTANEOUS
  Filled 2013-05-26 (×5): qty 0.5

## 2013-05-26 MED ORDER — OSELTAMIVIR PHOSPHATE 75 MG PO CAPS
75.0000 mg | ORAL_CAPSULE | Freq: Two times a day (BID) | ORAL | Status: DC
Start: 2013-05-26 — End: 2013-05-26

## 2013-05-26 MED ORDER — METHYLPREDNISOLONE SODIUM SUCC 40 MG IJ SOLR
40.0000 mg | Freq: Two times a day (BID) | INTRAMUSCULAR | Status: DC
  Administered 2013-05-26 – 2013-05-28 (×5): 40 mg via INTRAVENOUS
  Filled 2013-05-26 (×5): qty 1

## 2013-05-26 MED ORDER — ONDANSETRON HCL 4 MG PO TABS
4.0000 mg | ORAL_TABLET | Freq: Four times a day (QID) | ORAL | Status: DC | PRN
  Administered 2013-05-26 – 2013-05-29 (×3): 4 mg via ORAL
  Filled 2013-05-26 (×3): qty 1

## 2013-05-26 MED ORDER — NICOTINE 21 MG/24HR TD PT24
21.0000 mg | MEDICATED_PATCH | Freq: Every day | TRANSDERMAL | Status: DC
  Administered 2013-05-26 – 2013-05-30 (×5): 21 mg via TRANSDERMAL
  Filled 2013-05-26 (×5): qty 1

## 2013-05-26 MED ORDER — NITROGLYCERIN 0.4 MG SL SUBL
0.4000 mg | SUBLINGUAL_TABLET | SUBLINGUAL | Status: DC | PRN
  Administered 2013-05-26: 0.4 mg via SUBLINGUAL
  Filled 2013-05-26: qty 25

## 2013-05-26 MED ORDER — IPRATROPIUM BROMIDE 0.02 % IN SOLN: 0.5000 mg | Freq: Four times a day (QID) | RESPIRATORY_TRACT | Status: DC

## 2013-05-26 MED ORDER — METHYLPREDNISOLONE SODIUM SUCC 40 MG IJ SOLR: 40.0000 mg | Freq: Four times a day (QID) | INTRAMUSCULAR | Status: DC

## 2013-05-26 MED ORDER — INSULIN ASPART 100 UNIT/ML ~~LOC~~ SOLN
6.0000 [IU] | Freq: Three times a day (TID) | SUBCUTANEOUS | Status: DC
  Administered 2013-05-26 – 2013-05-30 (×13): 6 [IU] via SUBCUTANEOUS

## 2013-05-26 MED ORDER — SALINE SPRAY 0.65 % NA SOLN
1.0000 | NASAL | Status: DC | PRN
  Administered 2013-05-26: 1 via NASAL
  Filled 2013-05-26: qty 44

## 2013-05-26 MED ORDER — ALPRAZOLAM 1 MG PO TABS
1.0000 mg | ORAL_TABLET | Freq: Four times a day (QID) | ORAL | Status: DC | PRN
  Administered 2013-05-26: 2 mg via ORAL
  Administered 2013-05-26: 1 mg via ORAL
  Administered 2013-05-26 – 2013-05-30 (×10): 2 mg via ORAL
  Filled 2013-05-26 (×13): qty 2

## 2013-05-26 MED ORDER — MORPHINE SULFATE 2 MG/ML IJ SOLN
2.0000 mg | INTRAMUSCULAR | Status: DC | PRN
  Administered 2013-05-26 – 2013-05-27 (×4): 2 mg via INTRAVENOUS
  Filled 2013-05-26 (×4): qty 1

## 2013-05-26 MED ORDER — DILTIAZEM HCL ER COATED BEADS 240 MG PO CP24
240.0000 mg | ORAL_CAPSULE | Freq: Every day | ORAL | Status: DC
  Administered 2013-05-26 – 2013-05-30 (×5): 240 mg via ORAL
  Filled 2013-05-26 (×5): qty 1

## 2013-05-26 MED ORDER — OSELTAMIVIR PHOSPHATE 75 MG PO CAPS
75.0000 mg | ORAL_CAPSULE | Freq: Once | ORAL | Status: DC
  Administered 2013-05-26: 75 mg via ORAL
  Filled 2013-05-26: qty 1

## 2013-05-26 MED ORDER — MORPHINE SULFATE 4 MG/ML IJ SOLN
4.0000 mg | Freq: Once | INTRAMUSCULAR | Status: AC
  Administered 2013-05-26: 4 mg via INTRAVENOUS
  Filled 2013-05-26: qty 1

## 2013-05-26 MED ORDER — ASPIRIN 81 MG PO CHEW
324.0000 mg | CHEWABLE_TABLET | Freq: Once | ORAL | Status: AC
  Administered 2013-05-26: 324 mg via ORAL

## 2013-05-26 MED ORDER — ASPIRIN EC 325 MG PO TBEC
325.0000 mg | DELAYED_RELEASE_TABLET | Freq: Every day | ORAL | Status: DC
  Administered 2013-05-26 – 2013-05-30 (×5): 325 mg via ORAL
  Filled 2013-05-26 (×6): qty 1

## 2013-05-26 MED ORDER — LEVOTHYROXINE SODIUM 50 MCG PO TABS
250.0000 ug | ORAL_TABLET | Freq: Every day | ORAL | Status: DC
  Administered 2013-05-26 – 2013-05-30 (×5): 250 ug via ORAL
  Filled 2013-05-26: qty 1
  Filled 2013-05-26 (×6): qty 2
  Filled 2013-05-26 (×3): qty 1

## 2013-05-26 MED ORDER — SODIUM CHLORIDE 0.9 % IJ SOLN
3.0000 mL | Freq: Two times a day (BID) | INTRAMUSCULAR | Status: DC
  Administered 2013-05-26 – 2013-05-30 (×8): 3 mL via INTRAVENOUS

## 2013-05-26 MED ORDER — MORPHINE SULFATE 2 MG/ML IJ SOLN
INTRAMUSCULAR | Status: AC
  Administered 2013-05-26: 2 mg via INTRAVENOUS
  Filled 2013-05-26: qty 1

## 2013-05-26 MED ORDER — GABAPENTIN 300 MG PO CAPS
600.0000 mg | ORAL_CAPSULE | Freq: Four times a day (QID) | ORAL | Status: DC
  Administered 2013-05-26 – 2013-05-30 (×17): 600 mg via ORAL
  Filled 2013-05-26 (×17): qty 2

## 2013-05-26 MED ORDER — METHYLPREDNISOLONE SODIUM SUCC 125 MG IJ SOLR
125.0000 mg | Freq: Once | INTRAMUSCULAR | Status: AC
  Administered 2013-05-26: 125 mg via INTRAVENOUS
  Filled 2013-05-26: qty 2

## 2013-05-26 MED ORDER — INSULIN DETEMIR 100 UNIT/ML ~~LOC~~ SOLN
45.0000 [IU] | Freq: Every day | SUBCUTANEOUS | Status: DC
  Filled 2013-05-26: qty 0.45

## 2013-05-26 MED ORDER — ALBUTEROL SULFATE (2.5 MG/3ML) 0.083% IN NEBU: 2.5000 mg | INHALATION_SOLUTION | RESPIRATORY_TRACT | Status: DC | PRN

## 2013-05-26 MED ORDER — ALBUTEROL SULFATE (2.5 MG/3ML) 0.083% IN NEBU: 2.5000 mg | INHALATION_SOLUTION | Freq: Four times a day (QID) | RESPIRATORY_TRACT | Status: DC

## 2013-05-26 MED ORDER — MORPHINE SULFATE 2 MG/ML IJ SOLN
2.0000 mg | Freq: Once | INTRAMUSCULAR | Status: DC
Start: 2013-05-26 — End: 2013-05-30

## 2013-05-26 MED ORDER — PANTOPRAZOLE SODIUM 40 MG PO TBEC
40.0000 mg | DELAYED_RELEASE_TABLET | Freq: Two times a day (BID) | ORAL | Status: DC
  Administered 2013-05-26 – 2013-05-30 (×9): 40 mg via ORAL
  Filled 2013-05-26 (×9): qty 1

## 2013-05-26 MED ORDER — ONDANSETRON HCL 4 MG/2ML IJ SOLN
4.0000 mg | Freq: Once | INTRAMUSCULAR | Status: AC
  Administered 2013-05-26: 4 mg via INTRAVENOUS
  Filled 2013-05-26: qty 2

## 2013-05-26 MED ORDER — ONDANSETRON HCL 4 MG/2ML IJ SOLN
4.0000 mg | Freq: Four times a day (QID) | INTRAMUSCULAR | Status: DC | PRN
  Administered 2013-05-28: 4 mg via INTRAVENOUS
  Filled 2013-05-26: qty 2

## 2013-05-26 MED ORDER — ENOXAPARIN SODIUM 40 MG/0.4ML ~~LOC~~ SOLN
40.0000 mg | SUBCUTANEOUS | Status: DC
  Administered 2013-05-26 – 2013-05-30 (×5): 40 mg via SUBCUTANEOUS
  Filled 2013-05-26 (×5): qty 0.4

## 2013-05-26 MED ORDER — SODIUM CHLORIDE 0.9 % IV SOLN: INTRAVENOUS | Status: DC

## 2013-05-26 NOTE — Consult Note (Signed)
Reason for Consult:chest pain Referring Physician: PTH  Stephanie Combs is an 63 y.o. female.  HPI: This is a 63 yo female patient of Dr. Harl Bowie who was just in the hospital in December for Diastolic CHF. Home health was seeing her until last week. Appts were cancelled and rescheduled because she has trouble with transportation.  Last night she complained of chest tightness into left shoulder, left arm, and neck. She called EMS who transported her here.Troponins negative. EKG no acute change. Started on Tamiflu.& IV fluids because Crt up (1.6 today).  She has a history of COPD on home O2, continues to smoke, chronic diastolic CHF EF 68-03% with grade 1 diastolic dysfunction on echo 12/14. Currently Lasix and ACEI on hold. Also has anxiety/depression, and HTN.   Past Medical History  Diagnosis Date  . Hypertension   . Diabetes mellitus without complication   . COPD (chronic obstructive pulmonary disease)   . CHF (congestive heart failure)     Diastolic  . Anxiety   . Chronic back pain   . Hypothyroidism   . Pedal edema   . On home O2     2.5 L N/C prn    Past Surgical History  Procedure Laterality Date  . Tubal ligation    . Hernia repair      No family history on file.  Social History:  reports that she has been smoking Cigarettes.  She has been smoking about 0.25 packs per day. She does not have any smokeless tobacco history on file. She reports that she does not drink alcohol or use illicit drugs.  Allergies:  Allergies  Allergen Reactions  . Dilaudid [Hydromorphone Hcl]   . Oxycontin [Oxycodone]   . Reglan [Metoclopramide]   . Valium [Diazepam]   . Vistaril [Hydroxyzine Hcl]     Medications: Scheduled Meds: . aspirin  325 mg Oral Daily  . diltiazem  240 mg Oral Daily  . enoxaparin (LOVENOX) injection  40 mg Subcutaneous Q24H  . gabapentin  600 mg Oral QID  . insulin aspart  0-9 Units Subcutaneous TID WC  . insulin aspart  6 Units Subcutaneous TID WC  . insulin  detemir  50 Units Subcutaneous Daily  . ipratropium-albuterol  3 mL Nebulization Q6H  . levothyroxine  250 mcg Oral QAC breakfast  . meclizine  25 mg Oral BID  . methylPREDNISolone (SOLU-MEDROL) injection  40 mg Intravenous Q12H  . morphine  2 mg Intravenous Once  . nicotine  21 mg Transdermal Daily  . pantoprazole  40 mg Oral BID  . sodium chloride  3 mL Intravenous Q12H   Continuous Infusions: . sodium chloride     PRN Meds:.albuterol, alprazolam, morphine injection, ondansetron (ZOFRAN) IV, ondansetron   Results for orders placed during the hospital encounter of 05/25/13 (from the past 48 hour(s))  CBC     Status: Abnormal   Collection Time    05/26/13 12:10 AM      Result Value Range   WBC 10.2  4.0 - 10.5 K/uL   RBC 4.07  3.87 - 5.11 MIL/uL   Hemoglobin 12.1  12.0 - 15.0 g/dL   HCT 36.1  36.0 - 46.0 %   MCV 88.7  78.0 - 100.0 fL   MCH 29.7  26.0 - 34.0 pg   MCHC 33.5  30.0 - 36.0 g/dL   RDW 15.8 (*) 11.5 - 15.5 %   Platelets 343  150 - 400 K/uL  COMPREHENSIVE METABOLIC PANEL     Status: Abnormal  Collection Time    05/26/13 12:10 AM      Result Value Range   Sodium 138  137 - 147 mEq/L   Potassium 4.8  3.7 - 5.3 mEq/L   Chloride 96  96 - 112 mEq/L   CO2 28  19 - 32 mEq/L   Glucose, Bld 159 (*) 70 - 99 mg/dL   BUN 31 (*) 6 - 23 mg/dL   Creatinine, Ser 1.60 (*) 0.50 - 1.10 mg/dL   Calcium 8.7  8.4 - 10.5 mg/dL   Total Protein 6.7  6.0 - 8.3 g/dL   Albumin 3.2 (*) 3.5 - 5.2 g/dL   AST 13  0 - 37 U/L   ALT 6  0 - 35 U/L   Alkaline Phosphatase 114  39 - 117 U/L   Total Bilirubin 0.2 (*) 0.3 - 1.2 mg/dL   GFR calc non Af Amer 33 (*) >90 mL/min   GFR calc Af Amer 39 (*) >90 mL/min   Comment: (NOTE)     The eGFR has been calculated using the CKD EPI equation.     This calculation has not been validated in all clinical situations.     eGFR's persistently <90 mL/min signify possible Chronic Kidney     Disease.  TROPONIN I     Status: None   Collection Time     05/26/13 12:10 AM      Result Value Range   Troponin I <0.30  <0.30 ng/mL   Comment:            Due to the release kinetics of cTnI,     a negative result within the first hours     of the onset of symptoms does not rule out     myocardial infarction with certainty.     If myocardial infarction is still suspected,     repeat the test at appropriate intervals.  PRO B NATRIURETIC PEPTIDE     Status: Abnormal   Collection Time    05/26/13 12:10 AM      Result Value Range   Pro B Natriuretic peptide (BNP) 135.9 (*) 0 - 125 pg/mL  INFLUENZA PANEL BY PCR (TYPE A & B, H1N1)     Status: None   Collection Time    05/26/13  4:40 AM      Result Value Range   Influenza A By PCR NEGATIVE  NEGATIVE   Influenza B By PCR NEGATIVE  NEGATIVE   H1N1 flu by pcr NOT DETECTED  NOT DETECTED   Comment:            The Xpert Flu assay (FDA approved for     nasal aspirates or washes and     nasopharyngeal swab specimens), is     intended as an aid in the diagnosis of     influenza and should not be used as     a sole basis for treatment.  TROPONIN I     Status: None   Collection Time    05/26/13  6:32 AM      Result Value Range   Troponin I <0.30  <0.30 ng/mL   Comment:            Due to the release kinetics of cTnI,     a negative result within the first hours     of the onset of symptoms does not rule out     myocardial infarction with certainty.     If myocardial infarction is still  suspected,     repeat the test at appropriate intervals.  CBC     Status: Abnormal   Collection Time    05/26/13  6:32 AM      Result Value Range   WBC 6.0  4.0 - 10.5 K/uL   RBC 3.81 (*) 3.87 - 5.11 MIL/uL   Hemoglobin 11.2 (*) 12.0 - 15.0 g/dL   HCT 33.7 (*) 36.0 - 46.0 %   MCV 88.5  78.0 - 100.0 fL   MCH 29.4  26.0 - 34.0 pg   MCHC 33.2  30.0 - 36.0 g/dL   RDW 15.3  11.5 - 15.5 %   Platelets 328  150 - 400 K/uL  COMPREHENSIVE METABOLIC PANEL     Status: Abnormal   Collection Time    05/26/13  6:32 AM       Result Value Range   Sodium 134 (*) 137 - 147 mEq/L   Potassium 5.0  3.7 - 5.3 mEq/L   Chloride 95 (*) 96 - 112 mEq/L   CO2 23  19 - 32 mEq/L   Glucose, Bld 449 (*) 70 - 99 mg/dL   BUN 33 (*) 6 - 23 mg/dL   Creatinine, Ser 1.34 (*) 0.50 - 1.10 mg/dL   Calcium 8.5  8.4 - 10.5 mg/dL   Total Protein 6.6  6.0 - 8.3 g/dL   Albumin 3.1 (*) 3.5 - 5.2 g/dL   AST 12  0 - 37 U/L   ALT 6  0 - 35 U/L   Alkaline Phosphatase 116  39 - 117 U/L   Total Bilirubin 0.2 (*) 0.3 - 1.2 mg/dL   GFR calc non Af Amer 41 (*) >90 mL/min   GFR calc Af Amer 48 (*) >90 mL/min   Comment: (NOTE)     The eGFR has been calculated using the CKD EPI equation.     This calculation has not been validated in all clinical situations.     eGFR's persistently <90 mL/min signify possible Chronic Kidney     Disease.  GLUCOSE, CAPILLARY     Status: Abnormal   Collection Time    05/26/13  7:26 AM      Result Value Range   Glucose-Capillary 395 (*) 70 - 99 mg/dL    Dg Chest 2 View  05/26/2013   CLINICAL DATA:  Shortness of breath. generalized weakness. Bilateral lower extremity edema. Smoker with current history of COPD, hypertension, and diabetes.  EXAM: CHEST  2 VIEW  COMPARISON:  04/15/2013, 08/07/2012 [facility] and 04/05/2013 [facility]  FINDINGS: Suboptimal inspiration due to body habitus accounts for crowded bronchovascular markings diffusely and bibasilar atelectasis, and also accentuates the cardiac silhouette. Taking this into account, cardiac silhouette upper normal in size, unchanged. Thoracic aorta mildly atherosclerotic, unchanged. Hilar and mediastinal contours otherwise unremarkable. Lungs otherwise clear. No localized airspace consolidation. No pleural effusions. No pneumothorax. Normal pulmonary vascularity. Degenerative changes and DISH involving the thoracic spine.  IMPRESSION: Suboptimal inspiration accounts for atelectasis in the lower lobes. No acute cardiopulmonary disease  otherwise.   Electronically Signed   By: Evangeline Dakin M.D.   On: 05/26/2013 01:33    ROS See HPI headache, sorethroat, and runny nose. Eyes: Negative Ears:Negative for hearing loss, tinnitus Cardiovascular: Negative for  palpitations,irregular heartbeat, orthopnea, paroxysmal nocturnal dyspnea and syncope,edema, claudication, cyanosis,.  Respiratory:   Negative for cough, hemoptysis,sleep disturbances due to breathing, sputum production and wheezing.   Endocrine: Negative for cold intolerance and heat intolerance.  Hematologic/Lymphatic: Negative  for adenopathy and bleeding problem. Does not bruise/bleed easily.  Musculoskeletal: Negative.   Gastrointestinal: Negative for nausea, vomiting, reflux, abdominal pain, diarrhea, constipation.   Neurological: Negative.  Allergic/Immunologic: Negative for environmental allergies.  Blood pressure 111/55, pulse 89, temperature 98.5 F (36.9 C), temperature source Oral, resp. rate 20, height '5\' 6"'  (1.676 m), weight 251 lb 8.7 oz (114.1 kg), SpO2 95.00%. Physical Exam PHYSICAL EXAM: Well-nournished, in no acute distress. Neck: No JVD, HJR, Bruit, or thyroid enlargement Lungs: Decreased breath sounds with few crackles at the right base. Cardiovascular: RRR, PMI not displaced, heart sounds normal, no murmurs, gallops, bruit, thrill, or heave. Abdomen: BS normal. Soft without organomegaly, masses, lesions or tenderness. Extremities: without cyanosis, clubbing or edema. Good distal pulses bilateral SKin: Warm, no lesions or rashes  Musculoskeletal: No deformities Neuro: no focal signs  Imaging: ECHO:04/15/13: Study Conclusions  - Study data: Technically adequate study. - Left ventricle: The cavity size was normal. Wall thickness   was increased in a pattern of moderate LVH. Systolic   function was vigorous. The estimated ejection fraction was   in the range of 65% to 70%. Doppler parameters are   consistent with abnormal left ventricular  relaxation   (grade 1 diastolic dysfunction). - Aortic valve: Mildly calcified annulus. Trileaflet; mildly   thickened leaflets. Valve area: 1.91cm^2(VTI). Valve area:   2cm^2 (Vmax). - Mitral valve: Moderately calcified annulus. Mildly   thickened leaflets . - Left atrium: The atrium was mildly dilated. - Right ventricle: The cavity size was moderately to   severely dilated. - Right atrium: The atrium was mildly dilated. - Pulmonary arteries: Systolic pressure was moderately   increased. PA peak pressure: 51m Hg (S). - Inferior vena cava: The vessel was dilated; the   respirophasic diameter changes were blunted (< 50%);   findings are consistent with elevated central venous   pressure.    Assessment/Plan: Chest pain MI ruled out. Patient has never had work up for CAD with multiple cardiac risk factors. [facility].Will order lexiscan for tomorrow since patient has no car and trouble with transportation.  Chronic diastolic heart failure currently compensated. Will need to go home on Lasix with multiple admissions for CHF in the past 2 months. Was on Lasix 663mBID and Benazepril 1031mt home. Consider Lasix 86m28mD with close outpatient f/u.  Renal insufficiency: Crt down to 1.32 after IV fluids.   COPD on home O2 continues to smoke  HTN: controlled  Anxiety/depression.  Social issues: would benefit from [facility].  MichErmalinda Barrios5/2015, 9:57 AM

## 2013-05-26 NOTE — Progress Notes (Signed)
Chart review complete.  Patient is not eligible for [facility] Care Management services because his/her PCP is not a [facility] primary care provider or is not [facility] affiliated.  For any additional questions or new referrals please contact [facility] Liaison at [phone]

## 2013-05-26 NOTE — Progress Notes (Signed)
Inpatient Diabetes Program Recommendations  AACE/ADA: New Consensus Statement on Inpatient Glycemic Control (2013)  Target Ranges:  Prepandial:   less than 140 mg/dL      Peak postprandial:   less than 180 mg/dL (1-2 hours)      Critically ill patients:  140 - 180 mg/dL   Results for Stephanie Combs, Stephanie Combs (MRN [id number]) as of 05/26/2013 07:59  Ref. Range 05/26/2013 00:10 05/26/2013 06:32  Glucose Latest Range: 70-99 mg/dL 159 (H) 449 (H)   Inpatient Diabetes Program Recommendations Insulin - Basal: Please increase Levemir to 50 units daily. Correction (SSI): Please increase Novolog correction to resistant scale and add Novolog bedtime correction scale. Insulin - Meal Coverage: Please order Novolog 6 units TID with meals for meal coverage.  Thanks, Barnie Alderman, RN, MSN, CCRN Diabetes Coordinator Inpatient Diabetes Program [phone] (Team Pager) [phone] (AP office) [phone] [facility] office)

## 2013-05-26 NOTE — Progress Notes (Signed)
PT Cancellation Note  Patient Details Name: Stephanie Combs MRN: [id number] DOB: 1951-04-20   Cancelled Treatment:    Reason Eval/Treat Not Completed: Patient declined, no reason specified Pt declines PT stating that she doesn't need it.  She states that she is perfectly capable of walking, she even dances.  I recommended that if she decides that she would like to be seen for any mobility issues to please tell the MD or nurse.  Demetrios Isaacs L 05/26/2013, 3:16 PM

## 2013-05-26 NOTE — Progress Notes (Signed)
Nutrition Brief Note  Patient identified on the Malnutrition Screening Tool (MST) Report  Wt Readings from Last 15 Encounters:  05/26/13 251 lb 8.7 oz (114.1 kg)  04/21/13 274 lb 6.4 oz (124.467 kg)  08/09/12 294 lb 8.6 oz (133.6 kg)  02/25/12 238 lb (107.956 kg)    Body mass index is 40.62 kg/(m^2). 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   Pt appetite is very good.Current diet order is Heart Healthy/ CHO Modified, patient is consuming approximately 100% of meals at this time. Labs and medications reviewed. Weight loss likely related to fluid status changes.  Colman Cater MS,RD,CSG,LDN Office: [phone] Pager: [phone]

## 2013-05-26 NOTE — Progress Notes (Signed)
Utilization Review Complete  

## 2013-05-26 NOTE — Progress Notes (Signed)
63 year old female who has a past medical history of Hypertension; Diabetes mellitus without complication; COPD (chronic obstructive pulmonary disease); CHF (congestive heart failure); Anxiety; Chronic back pain; Hypothyroidism; Pedal edema; and On home O2 admitted with chest pain, copd exacerbation and flu like symptoms.  Exam: Gen: obese sitting up in bed putting on lipstick. Appears comfortable CV: RRR No m/g/r/ Trace LE edema PPP Respiratory: normal effort BS diminished with faint diffuse wheeze Abdomen: obese soft +BS non-tender to palpation Neuro: alert oriented x3. Somewhat anxious   Chest pain improved. Troponin negative to date. No events on tele. Admission for same last month and ruled out. Has not had follow up visit yet. Verbalizes anxiety about not having seen cards since discharge 04/21/13. Will request consult  Chronic diastolic HF. Continue cardizem. Appears compensated. Daily weights intake and output.   Will discontinue tamiflu as influenza panel negative  COPD exacerbation: will wean steroids. Continue nebs.   AKI/dehydration: IV fluids for 12 hours. Hold lasix and ACE. Home lasix dose 60mg  BID.    Stephanie Combs. Black NP  Patient is seen examined, chart reviewed, assessment, plan as above; appreciate cardiology eval;  [name]

## 2013-05-26 NOTE — Care Management Note (Addendum)
    Page 1 of 2   05/30/2013     11:46:56 AM   CARE MANAGEMENT NOTE 05/30/2013  Patient:  Stephanie Combs, Stephanie Combs   Account Number:  [id number]  Date Initiated:  05/26/2013  Documentation initiated by:  Claretha Cooper  Subjective/Objective Assessment:   Pt admitted from home. CM was asked to provide her with funds that could assist her with affording her nicotine patches. CM explain that there was no such fund, she insisted there was and asked for [name] to give her a call. Inez Catalina     Action/Plan:   financial Coun. aware.Prescription Drug Discount Cards given to pt.  Pt lives alone. Prev/current with [facility]. No additional  DME equipment needs identified.   Anticipated DC Date:  05/28/2013   Anticipated DC Plan:    In-house referral  [place]  CM consult      [facility] Choice  Resumption Of Svcs/PTA Provider   Choice offered to / List presented to:  NA           [place].   Status of service:  In process, will continue to follow Medicare Important Message given?  YES (If response is "NO", the following Medicare IM given date fields will be blank) Date Medicare IM given:  05/30/2013 Date Additional Medicare IM given:    Discharge Disposition:  HOME/SELF CARE  Per UR Regulation:    If discussed at Long Length of Stay Meetings, dates discussed:    Comments:  05/30/13 Claretha Cooper RN BSN CM Pt states she does not meed HH. Requests to speak with Tora prior to DC. Cecily informed since Bo Merino is out of office.  05/26/13 Claretha Cooper RN BSN CM Tim from [facility] asked to cm to call. Left VM with Tim.

## 2013-05-26 NOTE — H&P (Signed)
PCP:   HASANAJ,XAJE A, MD   Chief Complaint:  Chest pain  HPI:  63 year old female who  has a past medical history of Hypertension; Diabetes mellitus without complication; COPD (chronic obstructive pulmonary disease); CHF (congestive heart failure); Anxiety; Chronic back pain; Hypothyroidism; Pedal edema; and On home O2. Today presented to the ED with chest pain and leg swelling. Patient has history of COPD and is on home oxygen, she has been having shortness of breath but today started having chest pain so she came to the hospital. Pain radiates to neck and left shoulder him, she also complaining of headache, sore throat and runny nose. Denies nausea vomiting or diarrhea. Patient says that she has severe headache, she has a history of migraine in the past. Patient is also complaining of aches and pains in her back and legs. She does not have history of CAD. In the ED patient was started empirically on Tamiflu, influenza PCR has already been obtained.  Allergies:   Allergies  Allergen Reactions  . Dilaudid [Hydromorphone Hcl]   . Oxycontin [Oxycodone]   . Reglan [Metoclopramide]   . Valium [Diazepam]   . Vistaril [Hydroxyzine Hcl]       Past Medical History  Diagnosis Date  . Hypertension   . Diabetes mellitus without complication   . COPD (chronic obstructive pulmonary disease)   . CHF (congestive heart failure)     Diastolic  . Anxiety   . Chronic back pain   . Hypothyroidism   . Pedal edema   . On home O2     2.5 L N/C prn    Past Surgical History  Procedure Laterality Date  . Tubal ligation    . Hernia repair      Prior to Admission medications   Medication Sig Start Date End Date Taking? Authorizing Provider  albuterol (PROAIR HFA) 108 (90 BASE) MCG/ACT inhaler Inhale 1 puff into the lungs every 4 (four) hours as needed. For shortness of breath 04/21/13   Radene Gunning, NP  alprazolam Duanne Moron) 2 MG tablet Take 1-2 mg by mouth 4 (four) times daily as needed.  Sometimes patient only takes 54m, and she does not take 4 daily unless needed for anxiety    Historical Provider, MD  alum & mag hydroxide-simeth (MAALOX/MYLANTA) 200-200-20 MG/5ML suspension Take 30 mLs by mouth every 6 (six) hours as needed for indigestion or heartburn (dyspepsia). 04/21/13   KRadene Gunning NP  aspirin 325 MG EC tablet Take 325 mg by mouth daily. Ecotrin OTC    Historical Provider, MD  benazepril (LOTENSIN) 10 MG tablet Take 10 mg by mouth daily. 03/29/13   Historical Provider, MD  diltiazem (CARDIZEM CD) 240 MG 24 hr capsule Take 240 mg by mouth daily. 03/17/13   Historical Provider, MD  furosemide (LASIX) 20 MG tablet Take 3 tablets (60 mg total) by mouth 2 (two) times daily. 04/21/13   KRadene Gunning NP  gabapentin (NEURONTIN) 600 MG tablet Take 600 mg by mouth 4 (four) times daily. 03/02/13   Historical Provider, MD  glyBURIDE-metformin (GLUCOVANCE) 5-500 MG per tablet Take 2 tablets by mouth 2 (two) times daily with a meal.    Historical Provider, MD  guaiFENesin-dextromethorphan (ROBITUSSIN DM) 100-10 MG/5ML syrup Take 5 mLs by mouth every 4 (four) hours as needed for cough. 04/21/13   KRadene Gunning NP  HYDROcodone-acetaminophen (NORCO/VICODIN) 5-325 MG per tablet Take 1 tablet by mouth every 4 (four) hours as needed for pain. 02/25/12   Tammy L. Triplett,  PA-C  insulin aspart (NOVOLOG) 100 UNIT/ML injection Inject 25 Units into the skin daily. Takes back to back with Levemir 04/21/13   Radene Gunning, NP  insulin detemir (LEVEMIR) 100 UNIT/ML injection Inject 0.45 mLs (45 Units total) into the skin daily. 04/21/13   Radene Gunning, NP  levothyroxine (SYNTHROID, LEVOTHROID) 125 MCG tablet Take 250 mcg by mouth daily before breakfast.    Historical Provider, MD  meclizine (ANTIVERT) 25 MG tablet Take 25 mg by mouth 2 (two) times daily. 04/06/13   Historical Provider, MD  Omega-3 Fatty Acids (FISH OIL PO) Take 1 capsule by mouth daily.    Historical Provider, MD  pantoprazole  (PROTONIX) 40 MG tablet Take 1 tablet (40 mg total) by mouth 2 (two) times daily. 04/21/13   Radene Gunning, NP  potassium chloride (K-DUR) 10 MEQ tablet Take 20 mEq by mouth 2 (two) times daily. 03/29/13   Historical Provider, MD  predniSONE (DELTASONE) 10 MG tablet Take 5 tabs on 12/12, take 4 tabs on 12/13 take 3 tabs 12/14 take 2 tabs 12/15 take 1 tab 12/16 then stop. 04/21/13   Radene Gunning, NP    Social History:  reports that she has been smoking Cigarettes.  She has been smoking about 0.25 packs per day. She does not have any smokeless tobacco history on file. She reports that she does not drink alcohol or use illicit drugs.    All the positives are listed in BOLD  Review of Systems:  HEENT: Headache, blurred vision, runny nose, sore throat Neck: Hypothyroidism, hyperthyroidism,,lymphadenopathy Chest : Shortness of breath, history of COPD, Asthma Heart : Chest pain, history of coronary arterey disease GI:  Nausea, vomiting, diarrhea, constipation, GERD GU: Dysuria, urgency, frequency of urination, hematuria Neuro: Stroke, seizures, syncope Psych: Depression, anxiety, hallucinations   Physical Exam: Blood pressure 113/46, pulse 91, temperature 98 F (36.7 C), temperature source Oral, height '5\' 7"'  (1.702 m), weight 107.956 kg (238 lb), SpO2 97.00%. Constitutional:   Patient is a well-developed and well-nourished female in no acute distress and cooperative with exam. Head: Normocephalic and atraumatic Mouth: Mucus membranes moist Eyes: PERRL, EOMI, conjunctivae normal Neck: Supple, No Thyromegaly Cardiovascular: RRR, S1 normal, S2 normal Pulmonary/Chest: Bilateral wheezing, decreased breath sounds Abdominal: Soft. Non-tender, non-distended, bowel sounds are normal, no masses, organomegaly, or guarding present.  Neurological: A&O x3, Strenght is normal and symmetric bilaterally, cranial nerve II-XII are grossly intact, no focal motor deficit, sensory intact to light touch  bilaterally.  Extremities : Bilateral 1+ pitting edema   Labs on Admission:  Results for orders placed during the hospital encounter of 05/25/13 (from the past 48 hour(s))  CBC     Status: Abnormal   Collection Time    05/26/13 12:10 AM      Result Value Range   WBC 10.2  4.0 - 10.5 K/uL   RBC 4.07  3.87 - 5.11 MIL/uL   Hemoglobin 12.1  12.0 - 15.0 g/dL   HCT 36.1  36.0 - 46.0 %   MCV 88.7  78.0 - 100.0 fL   MCH 29.7  26.0 - 34.0 pg   MCHC 33.5  30.0 - 36.0 g/dL   RDW 15.8 (*) 11.5 - 15.5 %   Platelets 343  150 - 400 K/uL  COMPREHENSIVE METABOLIC PANEL     Status: Abnormal   Collection Time    05/26/13 12:10 AM      Result Value Range   Sodium 138  137 - 147 mEq/L  Potassium 4.8  3.7 - 5.3 mEq/L   Chloride 96  96 - 112 mEq/L   CO2 28  19 - 32 mEq/L   Glucose, Bld 159 (*) 70 - 99 mg/dL   BUN 31 (*) 6 - 23 mg/dL   Creatinine, Ser 1.60 (*) 0.50 - 1.10 mg/dL   Calcium 8.7  8.4 - 10.5 mg/dL   Total Protein 6.7  6.0 - 8.3 g/dL   Albumin 3.2 (*) 3.5 - 5.2 g/dL   AST 13  0 - 37 U/L   ALT 6  0 - 35 U/L   Alkaline Phosphatase 114  39 - 117 U/L   Total Bilirubin 0.2 (*) 0.3 - 1.2 mg/dL   GFR calc non Af Amer 33 (*) >90 mL/min   GFR calc Af Amer 39 (*) >90 mL/min   Comment: (NOTE)     The eGFR has been calculated using the CKD EPI equation.     This calculation has not been validated in all clinical situations.     eGFR's persistently <90 mL/min signify possible Chronic Kidney     Disease.  TROPONIN I     Status: None   Collection Time    05/26/13 12:10 AM      Result Value Range   Troponin I <0.30  <0.30 ng/mL   Comment:            Due to the release kinetics of cTnI,     a negative result within the first hours     of the onset of symptoms does not rule out     myocardial infarction with certainty.     If myocardial infarction is still suspected,     repeat the test at appropriate intervals.  PRO B NATRIURETIC PEPTIDE     Status: Abnormal   Collection Time    05/26/13  12:10 AM      Result Value Range   Pro B Natriuretic peptide (BNP) 135.9 (*) 0 - 125 pg/mL    Radiological Exams on Admission: Dg Chest 2 View  05/26/2013   CLINICAL DATA:  Shortness of breath. generalized weakness. Bilateral lower extremity edema. Smoker with current history of COPD, hypertension, and diabetes.  EXAM: CHEST  2 VIEW  COMPARISON:  04/15/2013, 08/07/2012 [facility] and 04/05/2013 [facility].  FINDINGS: Suboptimal inspiration due to body habitus accounts for crowded bronchovascular markings diffusely and bibasilar atelectasis, and also accentuates the cardiac silhouette. Taking this into account, cardiac silhouette upper normal in size, unchanged. Thoracic aorta mildly atherosclerotic, unchanged. Hilar and mediastinal contours otherwise unremarkable. Lungs otherwise clear. No localized airspace consolidation. No pleural effusions. No pneumothorax. Normal pulmonary vascularity. Degenerative changes and DISH involving the thoracic spine.  IMPRESSION: Suboptimal inspiration accounts for atelectasis in the lower lobes. No acute cardiopulmonary disease otherwise.   Electronically Signed   By: Evangeline Dakin M.D.   On: 05/26/2013 01:33    Assessment/Plan Principal Problem:   Chest pain Active Problems:   DM type 2 (diabetes mellitus, type 2)   Hypothyroidism (acquired)   COPD exacerbation   Headache  Chest pain-likely atypical and associated with influenza-like symptoms, patient denies pain at this time, but complains of generalized pain including shoulders and back and legs. Troponin is negative in the ED, will check troponin every 6 hours x3. EKG shows no significant ST-T changes.  Influenza like symptoms- patient has been started on Tamiflu empirically, influenza PCR has already been obtained. We'll follow the results of influenza PCR and discontinue Tamiflu  if results are negative.  COPD exacerbation-patient has underlying COPD and is on home oxygen. She  does have bilateral wheezing, will start her on Solu-Medrol, DuoNeb nebulizers.  Headache-patient has history of migraine, we'll continue with morphine when necessary along with Zofran for nausea vomiting.  Diabetes mellitus- patient takes Levemir at home, we'll continue living there at the home dose and start her on sliding-scale insulin.  AK I/ dehydration- patient has been on high-dose Lasix at home, and now has developed mild dehydration. Will hold the Lasix at this time and start gentle hydration with IV normal saline at  50 mL per hour. Will follow the BMP the morning.  Code status: Patient is DO NOT RESUSCITATE  Family discussion: no family at bedside   Time Spent on Admission: 60 minutes  [facility] Pager: [phone] 05/26/2013, 4:43 AM  If 7PM-7AM, please contact night-coverage  [email or web address]  Password [email or web address]

## 2013-05-26 NOTE — Consult Note (Signed)
The patient was seen and examined, and I agree with the assessment and plan as documented above, with modifications as noted below. Pt with h/o [facility] presented with chest pain, ruled out for an ACS. Multiple cardiovascular risk factors with no ischemic evaluation thus far. Will plan for nuclear stress test on 1/16. Of note, minimal PR depression in precordial leads with minimal PR elevation in aVR. If stress test is normal, consider pericarditis in differential. Agree with [facility] consult.

## 2013-05-27 ENCOUNTER — Encounter (HOSPITAL_COMMUNITY): Payer: Medicare Other

## 2013-05-27 ENCOUNTER — Encounter (HOSPITAL_COMMUNITY): Payer: Self-pay

## 2013-05-27 ENCOUNTER — Inpatient Hospital Stay (HOSPITAL_COMMUNITY): Payer: Medicare Other

## 2013-05-27 DIAGNOSIS — R079 Chest pain, unspecified: Secondary | ICD-10-CM

## 2013-05-27 DIAGNOSIS — R600 Localized edema: Secondary | ICD-10-CM | POA: Diagnosis present

## 2013-05-27 DIAGNOSIS — E875 Hyperkalemia: Secondary | ICD-10-CM | POA: Diagnosis not present

## 2013-05-27 DIAGNOSIS — J449 Chronic obstructive pulmonary disease, unspecified: Secondary | ICD-10-CM

## 2013-05-27 LAB — GLUCOSE, CAPILLARY
Glucose-Capillary: 394 mg/dL — ABNORMAL HIGH (ref 70–99)
Glucose-Capillary: 293 mg/dL — ABNORMAL HIGH (ref 70–99)
Glucose-Capillary: 408 mg/dL — ABNORMAL HIGH (ref 70–99)
Glucose-Capillary: 413 mg/dL — ABNORMAL HIGH (ref 70–99)

## 2013-05-27 LAB — CBC
HCT: 31.9 % — ABNORMAL LOW (ref 36.0–46.0)
Hemoglobin: 10.8 g/dL — ABNORMAL LOW (ref 12.0–15.0)
MCH: 29.6 pg (ref 26.0–34.0)
MCHC: 33.9 g/dL (ref 30.0–36.0)
MCV: 87.4 fL (ref 78.0–100.0)
Platelets: 312 10*3/uL (ref 150–400)
RBC: 3.65 MIL/uL — ABNORMAL LOW (ref 3.87–5.11)
RDW: 15 % (ref 11.5–15.5)
WBC: 11.4 10*3/uL — ABNORMAL HIGH (ref 4.0–10.5)

## 2013-05-27 LAB — BASIC METABOLIC PANEL
BUN: 37 mg/dL — ABNORMAL HIGH (ref 6–23)
CO2: 25 mEq/L (ref 19–32)
Calcium: 8.8 mg/dL (ref 8.4–10.5)
Chloride: 97 mEq/L (ref 96–112)
Creatinine, Ser: 1.12 mg/dL — ABNORMAL HIGH (ref 0.50–1.10)
GFR calc Af Amer: 59 mL/min — ABNORMAL LOW (ref >90)
GFR calc non Af Amer: 51 mL/min — ABNORMAL LOW (ref >90)
Glucose, Bld: 389 mg/dL — ABNORMAL HIGH (ref 70–99)
Potassium: 5.5 mEq/L — ABNORMAL HIGH (ref 3.7–5.3)
Sodium: 134 mEq/L — ABNORMAL LOW (ref 137–147)

## 2013-05-27 LAB — GLUCOSE, RANDOM: Glucose, Bld: 384 mg/dL — ABNORMAL HIGH (ref 70–99)

## 2013-05-27 MED ORDER — INSULIN ASPART 100 UNIT/ML ~~LOC~~ SOLN
0.0000 [IU] | Freq: Three times a day (TID) | SUBCUTANEOUS | Status: DC
  Administered 2013-05-27: 20 [IU] via SUBCUTANEOUS
  Administered 2013-05-28: 15 [IU] via SUBCUTANEOUS
  Administered 2013-05-28: 11 [IU] via SUBCUTANEOUS
  Administered 2013-05-28: 15 [IU] via SUBCUTANEOUS
  Administered 2013-05-29: 20 [IU] via SUBCUTANEOUS
  Administered 2013-05-29 (×2): 7 [IU] via SUBCUTANEOUS
  Administered 2013-05-30: 15 [IU] via SUBCUTANEOUS
  Administered 2013-05-30: 11 [IU] via SUBCUTANEOUS

## 2013-05-27 MED ORDER — TECHNETIUM TC 99M SESTAMIBI GENERIC - CARDIOLITE
10.0000 | Freq: Once | INTRAVENOUS | Status: AC | PRN
  Administered 2013-05-27: 10 via INTRAVENOUS

## 2013-05-27 MED ORDER — MORPHINE SULFATE 2 MG/ML IJ SOLN
2.0000 mg | INTRAMUSCULAR | Status: DC | PRN
  Administered 2013-05-27 – 2013-05-30 (×8): 2 mg via INTRAVENOUS
  Filled 2013-05-27 (×8): qty 1

## 2013-05-27 MED ORDER — INSULIN ASPART 100 UNIT/ML ~~LOC~~ SOLN
6.0000 [IU] | Freq: Once | SUBCUTANEOUS | Status: AC
  Administered 2013-05-27: 6 [IU] via SUBCUTANEOUS

## 2013-05-27 MED ORDER — SODIUM CHLORIDE 0.9 % IJ SOLN
INTRAMUSCULAR | Status: AC
  Administered 2013-05-27: 10 mL via INTRAVENOUS
  Filled 2013-05-27: qty 10

## 2013-05-27 MED ORDER — INSULIN DETEMIR 100 UNIT/ML ~~LOC~~ SOLN
60.0000 [IU] | Freq: Every day | SUBCUTANEOUS | Status: DC
  Administered 2013-05-28 – 2013-05-29 (×2): 60 [IU] via SUBCUTANEOUS
  Filled 2013-05-27 (×4): qty 0.6

## 2013-05-27 MED ORDER — INSULIN ASPART 100 UNIT/ML ~~LOC~~ SOLN
0.0000 [IU] | Freq: Every day | SUBCUTANEOUS | Status: DC
  Administered 2013-05-29: 3 [IU] via SUBCUTANEOUS

## 2013-05-27 MED ORDER — FUROSEMIDE 40 MG PO TABS
60.0000 mg | ORAL_TABLET | Freq: Two times a day (BID) | ORAL | Status: DC
  Administered 2013-05-27 – 2013-05-30 (×6): 60 mg via ORAL
  Filled 2013-05-27 (×12): qty 1

## 2013-05-27 MED ORDER — TECHNETIUM TC 99M SESTAMIBI - CARDIOLITE
30.0000 | Freq: Once | INTRAVENOUS | Status: AC | PRN
  Administered 2013-05-27: 30 via INTRAVENOUS

## 2013-05-27 MED ORDER — REGADENOSON 0.4 MG/5ML IV SOLN
INTRAVENOUS | Status: AC
  Administered 2013-05-27: 0.4 mg via INTRAVENOUS
  Filled 2013-05-27: qty 5

## 2013-05-27 MED ORDER — ACETAMINOPHEN 325 MG PO TABS: 650.0000 mg | ORAL_TABLET | Freq: Four times a day (QID) | ORAL | Status: DC | PRN

## 2013-05-27 NOTE — Progress Notes (Addendum)
[facility] PROGRESS NOTE  Stephanie Combs DHR:[id number] DOB: 11-Dec-1950 DOA: 05/25/2013 PCP: Stephanie Burly, MD  Assessment/Plan:   Chest pain: continue with intermittient. Troponin negative x3. No events on tele. Admission for same last month and ruled out. Stress test today. Awaiting results. Appreciate cardiology assistance.   Chronic diastolic HF. Lasix held initially due to worsening creatinine and dehydration. Will resume today as creatinine at baseline and LE edema worsening.  Continue cardizem.  Wt 114.1kg. Monitor intake and output  COPD exacerbation: Some increased work of breathing with activity. Continue steroids. Continue nebs. Improved air flow.   Acute on chronic respiratory failure: improved. Pt on oxygen at home. Current sats 98% on 2L which is close to baseline.     AKI/dehydration: IV fluids for 12 hours. Held lasix on admission. Creatinine improved. Will resume home meds.    HTN stable. Continue diltiazem and resume lasix.   Hyperkalemia:mild. Will likely resolve with lasix. Recheck in am.  Hyponatremia: related to fluid imbalance. Resuming lasix today. Will recheck in am.    Code Status: full Family Communication: family at baseline Disposition Plan: home when ready   Consultants:  cardiology  Procedures:  Stress test 05/27/13  Antibiotics *HPI/Subjective: Lying in bed visiting with family. Reports continued intermittent chest "tightness"  Objective: Filed Vitals:   05/26/13 2209  BP: 146/71  Pulse: 85  Temp: 97.9 F (36.6 C)  Resp: 20    Intake/Output Summary (Last 24 hours) at 05/27/13 1231 Last data filed at 05/26/13 2238  Gross per 24 hour  Intake    720 ml  Output   2100 ml  Net  -1380 ml   Filed Weights   05/25/13 2343 05/26/13 0643  Weight: 107.956 kg (238 lb) 114.1 kg (251 lb 8.7 oz)    Exam:   General:  Obese NAD  Cardiovascular: RRR No MGR 1-2+LE edema  Respiratory: normal effort. BS somewhat tight with wheeze.  No crackles  Abdomen: obese soft +BS non-tender to palpation  Musculoskeletal: no clubbing or cyanosis   Data Reviewed: Basic Metabolic Panel:  Recent Labs Lab 05/26/13 0010 05/26/13 0632 05/27/13 0538  NA 138 134* 134*  K 4.8 5.0 5.5*  CL 96 95* 97  CO2 28 23 25   GLUCOSE 159* 449* 389*  BUN 31* 33* 37*  CREATININE 1.60* 1.34* 1.12*  CALCIUM 8.7 8.5 8.8   Liver Function Tests:  Recent Labs Lab 05/26/13 0010 05/26/13 0632  AST 13 12  ALT 6 6  ALKPHOS 114 116  BILITOT 0.2* 0.2*  PROT 6.7 6.6  ALBUMIN 3.2* 3.1*   No results found for this basename: LIPASE, AMYLASE,  in the last 168 hours No results found for this basename: AMMONIA,  in the last 168 hours CBC:  Recent Labs Lab 05/26/13 0010 05/26/13 0632 05/27/13 0538  WBC 10.2 6.0 11.4*  HGB 12.1 11.2* 10.8*  HCT 36.1 33.7* 31.9*  MCV 88.7 88.5 87.4  PLT 343 328 312   Cardiac Enzymes:  Recent Labs Lab 05/26/13 0010 05/26/13 0632 05/26/13 1248 05/26/13 1801  TROPONINI <0.30 <0.30 <0.30 <0.30   BNP (last 3 results)  Recent Labs  04/14/13 1315 05/26/13 0010  PROBNP 14124.0* 135.9*   CBG:  Recent Labs Lab 05/26/13 1136 05/26/13 1643 05/26/13 2147 05/27/13 0756 05/27/13 1216  GLUCAP 379* 327* 355* 394* 293*    No results found for this or any previous visit (from the past 240 hour(s)).   Studies: Dg Chest 2 View  05/26/2013   CLINICAL DATA:  Shortness of breath. generalized weakness. Bilateral lower extremity edema. Smoker with current history of COPD, hypertension, and diabetes.  EXAM: CHEST  2 VIEW  COMPARISON:  04/15/2013, 08/07/2012 [facility] and 04/05/2013 [facility]  FINDINGS: Suboptimal inspiration due to body habitus accounts for crowded bronchovascular markings diffusely and bibasilar atelectasis, and also accentuates the cardiac silhouette. Taking this into account, cardiac silhouette upper normal in size, unchanged. Thoracic aorta mildly  atherosclerotic, unchanged. Hilar and mediastinal contours otherwise unremarkable. Lungs otherwise clear. No localized airspace consolidation. No pleural effusions. No pneumothorax. Normal pulmonary vascularity. Degenerative changes and DISH involving the thoracic spine.  IMPRESSION: Suboptimal inspiration accounts for atelectasis in the lower lobes. No acute cardiopulmonary disease otherwise.   Electronically Signed   By: Evangeline Dakin M.D.   On: 05/26/2013 01:33    Scheduled Meds: . aspirin  325 mg Oral Daily  . diltiazem  240 mg Oral Daily  . enoxaparin (LOVENOX) injection  40 mg Subcutaneous Q24H  . furosemide  60 mg Oral BID  . gabapentin  600 mg Oral QID  . insulin aspart  0-9 Units Subcutaneous TID WC  . insulin aspart  6 Units Subcutaneous TID WC  . insulin detemir  50 Units Subcutaneous Daily  . ipratropium-albuterol  3 mL Nebulization Q6H  . levothyroxine  250 mcg Oral QAC breakfast  . meclizine  25 mg Oral BID  . methylPREDNISolone (SOLU-MEDROL) injection  40 mg Intravenous Q12H  . morphine  2 mg Intravenous Once  . nicotine  21 mg Transdermal Daily  . pantoprazole  40 mg Oral BID  . sodium chloride  3 mL Intravenous Q12H   Continuous Infusions:   Principal Problem:   Chest pain Active Problems:   DM type 2 (diabetes mellitus, type 2)   HTN (hypertension), benign   Hypothyroidism (acquired)   CHF (congestive heart failure)   Acute-on-chronic respiratory failure   COPD exacerbation   Headache   Edema of lower extremity    Time spent: [place] [facility] [facility] Pager [phone]. If 7PM-7AM, please contact night-coverage at [email or web address], password [facility] 05/27/2013, 12:31 PM  LOS: 2 days   Patient is seen examined, acute on chronic respiratory failure with COPD exacerbation; clinically stable; cont current regimen, hyperglycemia, increase insulin regimen, close monitor;  -- assesment and plan as above; pend stress test; cardiology involved   [name],  Milad Bublitz N

## 2013-05-27 NOTE — Progress Notes (Signed)
Patient CBG 408. Dr. Daleen Bo notified. Will recheck.

## 2013-05-27 NOTE — Progress Notes (Signed)
Inpatient Diabetes Program Recommendations  AACE/ADA: New Consensus Statement on Inpatient Glycemic Control (2013)  Target Ranges:  Prepandial:   less than 140 mg/dL      Peak postprandial:   less than 180 mg/dL (1-2 hours)      Critically ill patients:  140 - 180 mg/dL   Results for Stephanie Combs, Stephanie Combs (MRN [id number]) as of 05/27/2013 12:16  Ref. Range 05/26/2013 07:26 05/26/2013 11:36 05/26/2013 16:43 05/26/2013 21:47 05/27/2013 07:56  Glucose-Capillary Latest Range: 70-99 mg/dL 395 (H) 379 (H) 327 (H) 355 (H) 394 (H)   Inpatient Diabetes Program Recommendations Insulin - Basal: Please increase Levemir to 55 units daily. Correction (SSI): Please increase Novolog correction to resistant scale and add Novolog bedtime correction.  Thanks, Barnie Alderman, RN, MSN, CCRN Diabetes Coordinator Inpatient Diabetes Program [phone] (Team Pager) [phone] ([facility] office) [phone] [facility] office)

## 2013-05-27 NOTE — Progress Notes (Signed)
Notified MD of blood sugar of 413.  Will continue to monitor.  Clayborn Bigness 05/27/2013

## 2013-05-27 NOTE — Progress Notes (Signed)
Stress Lab Nurses Notes - Stephanie Combs  Stephanie Combs 05/27/2013 Reason for doing test: Chest Pain Type of test: Wille Glaser Nurse performing test: Gerrit Halls, RN Nuclear Medicine Tech: Redmond Baseman Echo Tech: Not Applicable MD performing test: Dr. Lestine Mount.Lawrence NP Family MD: Dr. Telford Nab Test explained and consent signed: yes IV started: 20g jelco, Saline lock flushed, No redness or edema and Saline lock from floor Symptoms: Flushed Treatment/Intervention: None Reason test stopped: protocol completed After recovery IV was: No redness or edema and Saline Lock flushed Patient to return to [place]. Med at : 10:00 Patient discharged: Transported back to room 321 via [place] Patient's Condition upon discharge was: stable Comments: During test BP 116/62 & HR 98. Recovery BP 113/48 & HR 89.  Symptoms resolved in recovery. Geanie Cooley T

## 2013-05-27 NOTE — Clinical Documentation Improvement (Signed)
Please clarify respiratory status.Thank you.  Possible Clinical Conditions?  Acute Respiratory Failure Acute on Chronic Respiratory Failure Chronic Respiratory Failure Other Condition Cannot Clinically Determine   Supporting Information: Risk Factors: Admitted with COPD exacerbation History of COPD "On home O2 admitted with chest pain, copd exacerbation and flu like symptoms"  Signs & Symptoms: Shortness of breath  Diagnostics: CXR= IMPRESSION: Suboptimal inspiration accounts for atelectasis in the lower lobes.   Treatment: O2 2L/min; Keep O2 sats >92%  Thank You, Estella Husk ,RN Clinical Documentation Specialist:  [place] Information Management

## 2013-05-28 LAB — GLUCOSE, CAPILLARY
Glucose-Capillary: 318 mg/dL — ABNORMAL HIGH (ref 70–99)
Glucose-Capillary: 268 mg/dL — ABNORMAL HIGH (ref 70–99)
Glucose-Capillary: 358 mg/dL — ABNORMAL HIGH (ref 70–99)
Glucose-Capillary: 302 mg/dL — ABNORMAL HIGH (ref 70–99)
Glucose-Capillary: 490 mg/dL — ABNORMAL HIGH (ref 70–99)

## 2013-05-28 LAB — BASIC METABOLIC PANEL
BUN: 43 mg/dL — ABNORMAL HIGH (ref 6–23)
CO2: 28 mEq/L (ref 19–32)
Calcium: 8.9 mg/dL (ref 8.4–10.5)
Chloride: 97 mEq/L (ref 96–112)
Creatinine, Ser: 1.07 mg/dL (ref 0.50–1.10)
GFR calc Af Amer: 63 mL/min — ABNORMAL LOW (ref >90)
GFR calc non Af Amer: 54 mL/min — ABNORMAL LOW (ref >90)
Glucose, Bld: 297 mg/dL — ABNORMAL HIGH (ref 70–99)
Potassium: 5.2 mEq/L (ref 3.7–5.3)
Sodium: 134 mEq/L — ABNORMAL LOW (ref 137–147)

## 2013-05-28 LAB — CBC
HCT: 32.1 % — ABNORMAL LOW (ref 36.0–46.0)
Hemoglobin: 10.3 g/dL — ABNORMAL LOW (ref 12.0–15.0)
MCH: 28.4 pg (ref 26.0–34.0)
MCHC: 32.1 g/dL (ref 30.0–36.0)
MCV: 88.4 fL (ref 78.0–100.0)
Platelets: 317 10*3/uL (ref 150–400)
RBC: 3.63 MIL/uL — ABNORMAL LOW (ref 3.87–5.11)
RDW: 15 % (ref 11.5–15.5)
WBC: 10.9 10*3/uL — ABNORMAL HIGH (ref 4.0–10.5)

## 2013-05-28 LAB — GLUCOSE, RANDOM: Glucose, Bld: 528 mg/dL — ABNORMAL HIGH (ref 70–99)

## 2013-05-28 MED ORDER — METHYLPREDNISOLONE SODIUM SUCC 40 MG IJ SOLR
40.0000 mg | INTRAMUSCULAR | Status: DC
Start: 2013-05-29 — End: 2013-05-29
  Administered 2013-05-29: 40 mg via INTRAVENOUS
  Filled 2013-05-28: qty 1

## 2013-05-28 MED ORDER — INSULIN ASPART 100 UNIT/ML ~~LOC~~ SOLN
8.0000 [IU] | Freq: Once | SUBCUTANEOUS | Status: AC
  Administered 2013-05-28: 8 [IU] via SUBCUTANEOUS

## 2013-05-28 MED ORDER — AZITHROMYCIN 250 MG PO TABS
250.0000 mg | ORAL_TABLET | Freq: Every day | ORAL | Status: DC
  Administered 2013-05-28 – 2013-05-30 (×3): 250 mg via ORAL
  Filled 2013-05-28 (×3): qty 1

## 2013-05-28 NOTE — Progress Notes (Signed)
[facility] PROGRESS NOTE  ARAYAH KROUSE RDE:[id number] DOB: 1951-02-28 DOA: 05/25/2013 PCP: Neale Burly, MD  Assessment/Plan: 63 year old female who has a past medical history of Hypertension; Diabetes mellitus without complication; COPD (chronic obstructive pulmonary disease); CHF (congestive heart failure); Anxiety; Chronic back pain; Hypothyroidism; Pedal edema; and On home O2.  Today presented to the ED with chest pain and leg swelling. Patient has history of COPD and is on home oxygen, she has been having shortness of breath   1. Chest pain: Troponin negative x3. No events on tele. Stress test abnormal due to arm movement per  Cardiology;  -no new episodes; cont monitoring, cardiology recommended Outpatient follow up     2. Chronic diastolic HF.  -resume lasix, cont I/O, daily weight; lungs better on exam   3. COPD exacerbation: Some increased work of breathing with activity.  -improving, decrease steroids. Continue nebs. Improved air flow. Added azithromycin   4.Acute on chronic respiratory failure: improved. Pt on oxygen at home. Current sats 98% on 2L which is close to baseline.  -recommended to stop smoking   5. AKI/dehydration: IV fluids for 12 hours. Held lasix on admission. Creatinine improved. Will resume home meds.   6. HTN stable. Continue diltiazem and resume lasix.   7. DM uncontrolled due to steroids; cont insulin, increased, taper steroids   Code Status: DNR Family Communication: d/w patient, family at the bedside (indicate person spoken with, relationship, and if by phone, the number) Disposition Plan: home 24-48 hours    Consultants: Cardiology  Procedures:  Stress test   Antibiotics:  None  (indicate start date, and stop date if known)  HPI/Subjective: alert  Objective: Filed Vitals:   05/28/13 0505  BP: 120/49  Pulse: 71  Temp: 98.1 F (36.7 C)  Resp: 20    Intake/Output Summary (Last 24 hours) at 05/28/13 0944 Last data filed  at 05/28/13 0900  Gross per 24 hour  Intake    720 ml  Output   2750 ml  Net  -2030 ml   Filed Weights   05/25/13 2343 05/26/13 0643 05/28/13 0505  Weight: 107.956 kg (238 lb) 114.1 kg (251 lb 8.7 oz) 116 kg (255 lb 11.7 oz)    Exam:   General:  alert  Cardiovascular: s1,s2 rrr  Respiratory: CTA BL  Abdomen: soft, nt, nd   Musculoskeletal: no LE edema   Data Reviewed: Basic Metabolic Panel:  Recent Labs Lab 05/26/13 0010 05/26/13 0632 05/27/13 0538 05/27/13 2143 05/28/13 0650  NA 138 134* 134*  --  134*  K 4.8 5.0 5.5*  --  5.2  CL 96 95* 97  --  97  CO2 28 23 25   --  28  GLUCOSE 159* 449* 389* 384* 297*  BUN 31* 33* 37*  --  43*  CREATININE 1.60* 1.34* 1.12*  --  1.07  CALCIUM 8.7 8.5 8.8  --  8.9   Liver Function Tests:  Recent Labs Lab 05/26/13 0010 05/26/13 0632  AST 13 12  ALT 6 6  ALKPHOS 114 116  BILITOT 0.2* 0.2*  PROT 6.7 6.6  ALBUMIN 3.2* 3.1*   No results found for this basename: LIPASE, AMYLASE,  in the last 168 hours No results found for this basename: AMMONIA,  in the last 168 hours CBC:  Recent Labs Lab 05/26/13 0010 05/26/13 0632 05/27/13 0538 05/28/13 0650  WBC 10.2 6.0 11.4* 10.9*  HGB 12.1 11.2* 10.8* 10.3*  HCT 36.1 33.7* 31.9* 32.1*  MCV 88.7 88.5 87.4 88.4  PLT 343 328 312 317   Cardiac Enzymes:  Recent Labs Lab 05/26/13 0010 05/26/13 0632 05/26/13 1248 05/26/13 1801  TROPONINI <0.30 <0.30 <0.30 <0.30   BNP (last 3 results)  Recent Labs  04/14/13 1315 05/26/13 0010  PROBNP 14124.0* 135.9*   CBG:  Recent Labs Lab 05/27/13 1216 05/27/13 1712 05/27/13 2055 05/28/13 0154 05/28/13 0746  GLUCAP 293* 408* 413* 318* 268*    No results found for this or any previous visit (from the past 240 hour(s)).   Studies: Nm Myocar Single W/spect W/wall Motion And Ef  05/27/2013   CLINICAL DATA:  The patient is a 63 year old woman with a history of diastolic heart failure, who is admitted with chest pain. She  is referred for an ischemic evaluation.  EXAM: MYOCARDIAL IMAGING WITH SPECT (REST AND PHARMACOLOGIC-STRESS)  GATED LEFT VENTRICULAR WALL MOTION STUDY  LEFT VENTRICULAR EJECTION FRACTION  TECHNIQUE: Standard myocardial SPECT imaging was performed after resting intravenous injection of 10 mCi Tc-25m sestamibi. Subsequently, intravenous infusion of Lexiscan was performed under the supervision of the Cardiology staff. At peak effect of the drug, 30 mCi Tc-72m sestamibi was injected intravenously and standard myocardial SPECT imaging was performed. Quantitative gated imaging was also performed to evaluate left ventricular wall motion, and estimate left ventricular ejection fraction.  COMPARISON:  None.  FINDINGS: The patient was stressed according to Lexiscan protocol for 3 min and 11 seconds. The resting heart rate of 71 beats per min rose to a maximal heart of 99 beats per min. The resting blood pressure of 116/53 rose to a maximum blood pressure of 120/56. The stress test was stopped due to protocol completion.  The baseline ECG shows normal sinus rhythm, 75 beats per min, with an isolated PAC. The stress ECG showed no diagnostic ST segment or T-wave abnormalities, nor any arrhythmias.  Analysis of the raw data revealed that the patient brought her arms down, distorting the perfusion imaging. A mild degree of motion artifact was also noted. Analysis of the perfusion images revealed significant diaphragmatic artifact. There is a fixed apical anteroseptal and apical defect extending to the base. This defect was moderate and in size and severe in intensity. It may very well be due to patient arm movement. Left ventricular systolic function was normal with a calculated LV EF of 72%. TID value was 0.79.  IMPRESSION: 1.  Abnormal Lexiscan Cardiolite stress test.  2. Fixed apical anteroseptal and apical defect extending to the base. It is unclear whether this defect is actually due to myocardial scar vs significant artifact  caused by patient arm movement. Would consider re-imaging at a later point in time if deemed necessary.  3. Normal left ventricular systolic function, calculated LVEF 72%. Wall motion was abnormal in the apical region, but this may be due to the aforementioned reasons.  4.  No chest pain reported.  5.  EKG showed no diagnostic ST-T abnormalities.   Electronically Signed   By: Kate Sable   On: 05/27/2013 14:43    Scheduled Meds: . aspirin  325 mg Oral Daily  . diltiazem  240 mg Oral Daily  . enoxaparin (LOVENOX) injection  40 mg Subcutaneous Q24H  . furosemide  60 mg Oral BID  . gabapentin  600 mg Oral QID  . insulin aspart  0-20 Units Subcutaneous TID WC  . insulin aspart  0-5 Units Subcutaneous QHS  . insulin aspart  6 Units Subcutaneous TID WC  . insulin detemir  60 Units Subcutaneous Daily  . ipratropium-albuterol  3 mL Nebulization Q6H  . levothyroxine  250 mcg Oral QAC breakfast  . meclizine  25 mg Oral BID  . methylPREDNISolone (SOLU-MEDROL) injection  40 mg Intravenous Q12H  . morphine  2 mg Intravenous Once  . nicotine  21 mg Transdermal Daily  . pantoprazole  40 mg Oral BID  . sodium chloride  3 mL Intravenous Q12H   Continuous Infusions:   Principal Problem:   Chest pain Active Problems:   DM type 2 (diabetes mellitus, type 2)   HTN (hypertension), benign   Hypothyroidism (acquired)   CHF (congestive heart failure)   Hyponatremia   Acute-on-chronic respiratory failure   COPD exacerbation   Headache   Edema of lower extremity   Hyperkalemia    Time spent: >35 minutes     Kinnie Feil  [facility] Pager [phone]. If 7PM-7AM, please contact night-coverage at [email or web address], password [facility] 05/28/2013, 9:44 AM  LOS: 3 days

## 2013-05-29 DIAGNOSIS — J9620 Acute and chronic respiratory failure, unspecified whether with hypoxia or hypercapnia: Secondary | ICD-10-CM

## 2013-05-29 LAB — GLUCOSE, CAPILLARY
Glucose-Capillary: 217 mg/dL — ABNORMAL HIGH (ref 70–99)
Glucose-Capillary: 240 mg/dL — ABNORMAL HIGH (ref 70–99)
Glucose-Capillary: 259 mg/dL — ABNORMAL HIGH (ref 70–99)
Glucose-Capillary: 367 mg/dL — ABNORMAL HIGH (ref 70–99)
Glucose-Capillary: 375 mg/dL — ABNORMAL HIGH (ref 70–99)
Glucose-Capillary: 444 mg/dL — ABNORMAL HIGH (ref 70–99)

## 2013-05-29 MED ORDER — INSULIN DETEMIR 100 UNIT/ML ~~LOC~~ SOLN
80.0000 [IU] | Freq: Every day | SUBCUTANEOUS | Status: DC
  Administered 2013-05-30: 80 [IU] via SUBCUTANEOUS
  Filled 2013-05-29 (×2): qty 0.8

## 2013-05-29 MED ORDER — INSULIN ASPART 100 UNIT/ML ~~LOC~~ SOLN
8.0000 [IU] | Freq: Once | SUBCUTANEOUS | Status: AC
Start: 2013-05-29 — End: 2013-05-29
  Administered 2013-05-29: 8 [IU] via SUBCUTANEOUS

## 2013-05-29 MED ORDER — FLUCONAZOLE 100 MG PO TABS
100.0000 mg | ORAL_TABLET | Freq: Once | ORAL | Status: AC
  Administered 2013-05-29: 100 mg via ORAL
  Filled 2013-05-29: qty 1

## 2013-05-29 NOTE — Progress Notes (Signed)
[facility] PROGRESS NOTE  KAMELA BLANSETT KKX:[id number] DOB: [birth year] DOA: 05/25/2013 PCP: Neale Burly, MD  Assessment/Plan: 63 year old female who has a past medical history of Hypertension; Diabetes mellitus without complication; COPD (chronic obstructive pulmonary disease); CHF (congestive heart failure); Anxiety; Chronic back pain; Hypothyroidism; Pedal edema; and On home O2.  Today presented to the ED with chest pain and leg swelling. Patient has history of COPD and is on home oxygen, she has been having shortness of breath   1. Chest pain: Troponin negative x3. No events on tele. Stress test abnormal due to arm movement per  Cardiology;  -no new episodes; cont monitoring, cardiology recommended Outpatient follow up     2. Chronic diastolic HF.  -resumed lasix, cont I/O, daily weight; lungs better on exam   3. COPD exacerbation: Some increased work of breathing with activity.  -improving, d/c steroids. Continue nebs. Improved air flow. Added azithromycin   4. Acute on chronic respiratory failure: improved. Pt on oxygen at home. Current sats 98% on 2L which is close to baseline.  -recommended to stop smoking   5. AKI/dehydration: IV fluids for 12 hours. Held lasix on admission. Creatinine improved. Will resume home meds.   6. HTN stable. Continue diltiazem and resume lasix.   7. DM uncontrolled due to steroids; cont insulin, increased lantus 80 (1/18), d/c steroids   Code Status: DNR Family Communication: d/w patient, family at the bedside (indicate person spoken with, relationship, and if by phone, the number) Disposition Plan: home 24-48 hours    Consultants: Cardiology  Procedures:  Stress test   Antibiotics:  None  (indicate start date, and stop date if known)  HPI/Subjective: alert  Objective: Filed Vitals:   05/29/13 0311  BP: 141/58  Pulse: 73  Temp: 98.1 F (36.7 C)  Resp: 20    Intake/Output Summary (Last 24 hours) at 05/29/13 0858 Last  data filed at 05/29/13 0500  Gross per 24 hour  Intake      0 ml  Output   1400 ml  Net  -1400 ml   Filed Weights   05/25/13 2343 05/26/13 0643 05/28/13 0505  Weight: 107.956 kg (238 lb) 114.1 kg (251 lb 8.7 oz) 116 kg (255 lb 11.7 oz)    Exam:   General:  alert  Cardiovascular: s1,s2 rrr  Respiratory: CTA BL  Abdomen: soft, nt, nd   Musculoskeletal: no LE edema   Data Reviewed: Basic Metabolic Panel:  Recent Labs Lab 05/26/13 0010 05/26/13 1696 05/27/13 0538 05/27/13 2143 05/28/13 0650 05/28/13 2152  NA 138 134* 134*  --  134*  --   K 4.8 5.0 5.5*  --  5.2  --   CL 96 95* 97  --  97  --   CO2 28 23 25   --  28  --   GLUCOSE 159* 449* 389* 384* 297* 528*  BUN 31* 33* 37*  --  43*  --   CREATININE 1.60* 1.34* 1.12*  --  1.07  --   CALCIUM 8.7 8.5 8.8  --  8.9  --    Liver Function Tests:  Recent Labs Lab 05/26/13 0010 05/26/13 0632  AST 13 12  ALT 6 6  ALKPHOS 114 116  BILITOT 0.2* 0.2*  PROT 6.7 6.6  ALBUMIN 3.2* 3.1*   No results found for this basename: LIPASE, AMYLASE,  in the last 168 hours No results found for this basename: AMMONIA,  in the last 168 hours CBC:  Recent Labs Lab 05/26/13 0010 05/26/13  3151 05/27/13 0538 05/28/13 0650  WBC 10.2 6.0 11.4* 10.9*  HGB 12.1 11.2* 10.8* 10.3*  HCT 36.1 33.7* 31.9* 32.1*  MCV 88.7 88.5 87.4 88.4  PLT 343 328 312 317   Cardiac Enzymes:  Recent Labs Lab 05/26/13 0010 05/26/13 0632 05/26/13 1248 05/26/13 1801  TROPONINI <0.30 <0.30 <0.30 <0.30   BNP (last 3 results)  Recent Labs  04/14/13 1315 05/26/13 0010  PROBNP 14124.0* 135.9*   CBG:  Recent Labs Lab 05/28/13 1702 05/28/13 2129 05/29/13 0012 05/29/13 0309 05/29/13 0737  GLUCAP 302* 490* 444* 367* 217*    No results found for this or any previous visit (from the past 240 hour(s)).   Studies: Nm Myocar Single W/spect W/wall Motion And Ef  05/27/2013   CLINICAL DATA:  The patient is a 63 year old woman with a history  of diastolic heart failure, who is admitted with chest pain. She is referred for an ischemic evaluation.  EXAM: MYOCARDIAL IMAGING WITH SPECT (REST AND PHARMACOLOGIC-STRESS)  GATED LEFT VENTRICULAR WALL MOTION STUDY  LEFT VENTRICULAR EJECTION FRACTION  TECHNIQUE: Standard myocardial SPECT imaging was performed after resting intravenous injection of 10 mCi Tc-31m sestamibi. Subsequently, intravenous infusion of Lexiscan was performed under the supervision of the Cardiology staff. At peak effect of the drug, 30 mCi Tc-54m sestamibi was injected intravenously and standard myocardial SPECT imaging was performed. Quantitative gated imaging was also performed to evaluate left ventricular wall motion, and estimate left ventricular ejection fraction.  COMPARISON:  None.  FINDINGS: The patient was stressed according to Lexiscan protocol for 3 min and 11 seconds. The resting heart rate of 71 beats per min rose to a maximal heart of 99 beats per min. The resting blood pressure of 116/53 rose to a maximum blood pressure of 120/56. The stress test was stopped due to protocol completion.  The baseline ECG shows normal sinus rhythm, 75 beats per min, with an isolated PAC. The stress ECG showed no diagnostic ST segment or T-wave abnormalities, nor any arrhythmias.  Analysis of the raw data revealed that the patient brought her arms down, distorting the perfusion imaging. A mild degree of motion artifact was also noted. Analysis of the perfusion images revealed significant diaphragmatic artifact. There is a fixed apical anteroseptal and apical defect extending to the base. This defect was moderate and in size and severe in intensity. It may very well be due to patient arm movement. Left ventricular systolic function was normal with a calculated LV EF of 72%. TID value was 0.79.  IMPRESSION: 1.  Abnormal Lexiscan Cardiolite stress test.  2. Fixed apical anteroseptal and apical defect extending to the base. It is unclear whether this  defect is actually due to myocardial scar vs significant artifact caused by patient arm movement. Would consider re-imaging at a later point in time if deemed necessary.  3. Normal left ventricular systolic function, calculated LVEF 72%. Wall motion was abnormal in the apical region, but this may be due to the aforementioned reasons.  4.  No chest pain reported.  5.  EKG showed no diagnostic ST-T abnormalities.   Electronically Signed   By: Kate Sable   On: 05/27/2013 14:43    Scheduled Meds: . aspirin  325 mg Oral Daily  . azithromycin  250 mg Oral Daily  . diltiazem  240 mg Oral Daily  . enoxaparin (LOVENOX) injection  40 mg Subcutaneous Q24H  . furosemide  60 mg Oral BID  . gabapentin  600 mg Oral QID  . insulin aspart  0-20 Units Subcutaneous TID WC  . insulin aspart  0-5 Units Subcutaneous QHS  . insulin aspart  6 Units Subcutaneous TID WC  . [START ON 05/30/2013] insulin detemir  80 Units Subcutaneous Daily  . ipratropium-albuterol  3 mL Nebulization Q6H  . levothyroxine  250 mcg Oral QAC breakfast  . meclizine  25 mg Oral BID  . methylPREDNISolone (SOLU-MEDROL) injection  40 mg Intravenous Q24H  . morphine  2 mg Intravenous Once  . nicotine  21 mg Transdermal Daily  . pantoprazole  40 mg Oral BID  . sodium chloride  3 mL Intravenous Q12H   Continuous Infusions:   Principal Problem:   Chest pain Active Problems:   DM type 2 (diabetes mellitus, type 2)   HTN (hypertension), benign   Hypothyroidism (acquired)   CHF (congestive heart failure)   Hyponatremia   Acute-on-chronic respiratory failure   COPD exacerbation   Headache   Edema of lower extremity   Hyperkalemia    Time spent: >35 minutes     Kinnie Feil  [facility] Pager [phone]. If 7PM-7AM, please contact night-coverage at [email or web address], password [facility] 05/29/2013, 8:58 AM  LOS: 4 days

## 2013-05-30 LAB — GLUCOSE, CAPILLARY
Glucose-Capillary: 260 mg/dL — ABNORMAL HIGH (ref 70–99)
Glucose-Capillary: 329 mg/dL — ABNORMAL HIGH (ref 70–99)

## 2013-05-30 MED ORDER — CLOTRIMAZOLE 1 % VA CREA: 1.0000 | TOPICAL_CREAM | Freq: Every day | VAGINAL | Status: DC

## 2013-05-30 MED ORDER — DILTIAZEM HCL ER COATED BEADS 240 MG PO CP24: 240.0000 mg | ORAL_CAPSULE | Freq: Every day | ORAL | Status: DC

## 2013-05-30 MED ORDER — DSS 100 MG PO CAPS: 100.0000 mg | ORAL_CAPSULE | Freq: Two times a day (BID) | ORAL | Status: DC

## 2013-05-30 MED ORDER — ALPRAZOLAM 2 MG PO TABS: 1.0000 mg | ORAL_TABLET | Freq: Four times a day (QID) | ORAL | Status: DC | PRN

## 2013-05-30 MED ORDER — SENNA 8.6 MG PO TABS: 1.0000 | ORAL_TABLET | Freq: Every day | ORAL | Status: DC

## 2013-05-30 MED ORDER — INSULIN ASPART 100 UNIT/ML ~~LOC~~ SOLN: 6.0000 [IU] | Freq: Three times a day (TID) | SUBCUTANEOUS | Status: DC

## 2013-05-30 MED ORDER — NICOTINE 21 MG/24HR TD PT24: 21.0000 mg | MEDICATED_PATCH | Freq: Every day | TRANSDERMAL | Status: DC

## 2013-05-30 MED ORDER — INSULIN DETEMIR 100 UNIT/ML ~~LOC~~ SOLN: 60.0000 [IU] | Freq: Every day | SUBCUTANEOUS | Status: DC

## 2013-05-30 MED ORDER — HYDROCODONE-ACETAMINOPHEN 5-325 MG PO TABS: 1.0000 | ORAL_TABLET | ORAL | Status: DC | PRN

## 2013-05-30 MED ORDER — DOCUSATE SODIUM 100 MG PO CAPS
100.0000 mg | ORAL_CAPSULE | Freq: Two times a day (BID) | ORAL | Status: DC
Start: 2013-05-30 — End: 2013-05-30
  Administered 2013-05-30: 100 mg via ORAL
  Filled 2013-05-30: qty 1

## 2013-05-30 MED ORDER — IPRATROPIUM-ALBUTEROL 0.5-2.5 (3) MG/3ML IN SOLN: 3.0000 mL | Freq: Four times a day (QID) | RESPIRATORY_TRACT | Status: DC

## 2013-05-30 MED ORDER — SENNA 8.6 MG PO TABS
1.0000 | ORAL_TABLET | Freq: Every day | ORAL | Status: DC
  Administered 2013-05-30: 8.6 mg via ORAL
  Filled 2013-05-30: qty 1

## 2013-05-30 MED ORDER — GABAPENTIN 600 MG PO TABS
600.0000 mg | ORAL_TABLET | Freq: Four times a day (QID) | ORAL | Status: DC
Start: 2013-05-30 — End: 2015-05-02

## 2013-05-30 MED ORDER — FUROSEMIDE 20 MG PO TABS: 60.0000 mg | ORAL_TABLET | Freq: Two times a day (BID) | ORAL | Status: DC

## 2013-05-30 MED ORDER — CLOTRIMAZOLE 1 % VA CREA
1.0000 | TOPICAL_CREAM | Freq: Every day | VAGINAL | Status: DC
  Filled 2013-05-30: qty 45

## 2013-05-30 MED ORDER — LEVOTHYROXINE SODIUM 125 MCG PO TABS: 250.0000 ug | ORAL_TABLET | Freq: Every day | ORAL | Status: DC

## 2013-05-30 MED ORDER — ALBUTEROL SULFATE HFA 108 (90 BASE) MCG/ACT IN AERS: 1.0000 | INHALATION_SPRAY | RESPIRATORY_TRACT | Status: DC | PRN

## 2013-05-30 MED ORDER — INSULIN ASPART 100 UNIT/ML ~~LOC~~ SOLN: 0.0000 [IU] | Freq: Three times a day (TID) | SUBCUTANEOUS | Status: DC

## 2013-05-30 NOTE — Progress Notes (Signed)
Recheck of blood sugar is 444. Paged MD. Ordered 8 units of Novalog again.

## 2013-05-30 NOTE — Progress Notes (Signed)
Notified MD of 490 blood sugar. Obtained STAT labs and order for 8 units Novalog. Will recheck blood sugar

## 2013-05-30 NOTE — Plan of Care (Signed)
Problem: Discharge Progression Outcomes Goal: Pain controlled with appropriate interventions Outcome: Completed/Met Date Met:  05/30/13 Pt taking pain medication as prescribed by physician Goal: Complications resolved/controlled Outcome: Completed/Met Date Met:  05/30/13 Constipation relieved by soap suds enema

## 2013-05-30 NOTE — Discharge Summary (Signed)
Physician Discharge Summary  Stephanie Combs HWE:[id number] DOB: 12-06-1950 DOA: 05/25/2013  PCP: Neale Burly, MD  Admit date: 05/25/2013 Discharge date: 05/30/2013  Time spent: >35 minutes  Recommendations for Outpatient Follow-up:  F/u with PCP in 1 week  Discharge Diagnoses:  Principal Problem:   Chest pain Active Problems:   DM type 2 (diabetes mellitus, type 2)   HTN (hypertension), benign   Hypothyroidism (acquired)   CHF (congestive heart failure)   Hyponatremia   Acute-on-chronic respiratory failure   COPD exacerbation   Headache   Edema of lower extremity   Hyperkalemia   Discharge Condition: stable   Diet recommendation: DM  Filed Weights   05/25/13 2343 05/26/13 0643 05/28/13 0505  Weight: 107.956 kg (238 lb) 114.1 kg (251 lb 8.7 oz) 116 kg (255 lb 11.7 oz)    History of present illness:  63 year old female who has a past medical history of Hypertension; Diabetes mellitus without complication; COPD (chronic obstructive pulmonary disease); CHF (congestive heart failure); Anxiety; Chronic back pain; Hypothyroidism; Pedal edema; and On home O2.  - presented to the ED with chest pain and leg swelling. Patient has history of COPD and is on home oxygen, she has been having shortness of breath   Hospital Course 1. Chest pain: Troponin negative x3. No events on tele. Stress test abnormal due to arm movement per Cardiology;  -no new episodes; cont monitoring, cardiology recommended Outpatient follow up  2. Chronic diastolic HF.  -resumed lasix, lungs better on exam; cont diuretics OP follow up  3. COPD exacerbation: Some increased work of breathing with activity.  -improved on steroids, nebs.   4. Acute on chronic respiratory failure: improved. Pt on oxygen at home. Current sats 98% on 2L which is close to baseline.  -recommended to stop smoking  5. AKI/dehydration: resolved  6. HTN stable.   7. DM uncontrolled due to steroids; also patient was taking novolog only  once a day; insulin regimen changed to levemir 60 units+ novolog 6 unit swith meals + ISS;  -blood sugars improved and reccommended to f/u with PCP next week to titrate  As needed;    Procedures:  None  (i.e. Studies not automatically included, echos, thoracentesis, etc; not x-rays)  Consultations:  None   Discharge Exam: Filed Vitals:   05/30/13 0601  BP: 131/73  Pulse: 69  Temp: 98.2 F (36.8 C)  Resp: 20    General: alert Cardiovascular: s1,s2 rrr Respiratory: CTA BL  Discharge Instructions  Discharge Orders   Future Appointments Provider Department Dept Phone   05/31/2013 3:40 PM Arnoldo Lenis, MD [facility] [phone]   Future Orders Complete By Expires   Diet - low sodium heart healthy  As directed    Discharge instructions  As directed    Comments:     Follow up with primary care in 1 week   DME Nebulizer machine  As directed    Increase activity slowly  As directed        Medication List         albuterol 108 (90 BASE) MCG/ACT inhaler  Commonly known as:  PROAIR HFA  Inhale 1 puff into the lungs every 4 (four) hours as needed. For shortness of breath     alprazolam 2 MG tablet  Commonly known as:  XANAX  Take 0.5-1 tablets (1-2 mg total) by mouth 4 (four) times daily as needed. Sometimes patient only takes 1mg , and she does not take 4 daily unless needed for anxiety  aspirin 325 MG EC tablet  Take 325 mg by mouth daily. Ecotrin OTC     clotrimazole 1 % vaginal cream  Commonly known as:  GYNE-LOTRIMIN  Place 1 Applicatorful vaginally at bedtime.     diltiazem 240 MG 24 hr capsule  Commonly known as:  CARDIZEM CD  Take 1 capsule (240 mg total) by mouth daily.     DSS 100 MG Caps  Take 100 mg by mouth 2 (two) times daily.     FISH OIL PO  Take 1 capsule by mouth daily.     furosemide 20 MG tablet  Commonly known as:  LASIX  Take 3 tablets (60 mg total) by mouth 2 (two) times daily.     gabapentin 600 MG tablet   Commonly known as:  NEURONTIN  Take 1 tablet (600 mg total) by mouth 4 (four) times daily.     glipiZIDE-metformin 5-500 MG per tablet  Commonly known as:  METAGLIP  Take 1 tablet by mouth 2 (two) times daily before a meal.     HYDROcodone-acetaminophen 5-325 MG per tablet  Commonly known as:  NORCO/VICODIN  Take 1 tablet by mouth every 4 (four) hours as needed.     insulin aspart 100 UNIT/ML injection  Commonly known as:  novoLOG  Inject 0-20 Units into the skin 3 (three) times daily with meals.     insulin aspart 100 UNIT/ML injection  Commonly known as:  novoLOG  Inject 6 Units into the skin 3 (three) times daily with meals.     insulin detemir 100 UNIT/ML injection  Commonly known as:  LEVEMIR  Inject 0.6 mLs (60 Units total) into the skin daily.     ipratropium-albuterol 0.5-2.5 (3) MG/3ML Soln  Commonly known as:  DUONEB  Take 3 mLs by nebulization every 6 (six) hours.     levothyroxine 125 MCG tablet  Commonly known as:  SYNTHROID, LEVOTHROID  Take 2 tablets (250 mcg total) by mouth daily before breakfast.     meclizine 25 MG tablet  Commonly known as:  ANTIVERT  Take 25 mg by mouth 2 (two) times daily.     nicotine 21 mg/24hr patch  Commonly known as:  NICODERM CQ - dosed in mg/24 hours  Place 1 patch (21 mg total) onto the skin daily.     pantoprazole 40 MG tablet  Commonly known as:  PROTONIX  Take 1 tablet (40 mg total) by mouth 2 (two) times daily.     potassium chloride 10 MEQ tablet  Commonly known as:  K-DUR  Take 25 mEq by mouth daily.     senna 8.6 MG Tabs tablet  Commonly known as:  SENOKOT  Take 1 tablet (8.6 mg total) by mouth daily.       Allergies  Allergen Reactions  . Dilaudid [Hydromorphone Hcl]   . Oxycontin [Oxycodone]   . Reglan [Metoclopramide]   . Valium [Diazepam]   . Vistaril [Hydroxyzine Hcl]        Follow-up Information   Follow up with HASANAJ,XAJE A, MD In 1 week.   Specialty:  Internal Medicine   Contact  information:   [address] [place] [address] [phone]        The results of significant diagnostics from this hospitalization (including imaging, microbiology, ancillary and laboratory) are listed below for reference.    Significant Diagnostic Studies: Dg Chest 2 View  05/26/2013   CLINICAL DATA:  Shortness of breath. generalized weakness. Bilateral lower extremity edema. Smoker with current  history of COPD, hypertension, and diabetes.  EXAM: CHEST  2 VIEW  COMPARISON:  04/15/2013, 08/07/2012 [facility] and 04/05/2013 [facility].  FINDINGS: Suboptimal inspiration due to body habitus accounts for crowded bronchovascular markings diffusely and bibasilar atelectasis, and also accentuates the cardiac silhouette. Taking this into account, cardiac silhouette upper normal in size, unchanged. Thoracic aorta mildly atherosclerotic, unchanged. Hilar and mediastinal contours otherwise unremarkable. Lungs otherwise clear. No localized airspace consolidation. No pleural effusions. No pneumothorax. Normal pulmonary vascularity. Degenerative changes and DISH involving the thoracic spine.  IMPRESSION: Suboptimal inspiration accounts for atelectasis in the lower lobes. No acute cardiopulmonary disease otherwise.   Electronically Signed   By: Evangeline Dakin M.D.   On: 05/26/2013 01:33   Nm Myocar Single W/spect W/wall Motion And Ef  05/27/2013   CLINICAL DATA:  The patient is a 63 year old woman with a history of diastolic heart failure, who is admitted with chest pain. She is referred for an ischemic evaluation.  EXAM: MYOCARDIAL IMAGING WITH SPECT (REST AND PHARMACOLOGIC-STRESS)  GATED LEFT VENTRICULAR WALL MOTION STUDY  LEFT VENTRICULAR EJECTION FRACTION  TECHNIQUE: Standard myocardial SPECT imaging was performed after resting intravenous injection of 10 mCi Tc-36m sestamibi. Subsequently, intravenous infusion of Lexiscan was performed under the supervision of the Cardiology  staff. At peak effect of the drug, 30 mCi Tc-46m sestamibi was injected intravenously and standard myocardial SPECT imaging was performed. Quantitative gated imaging was also performed to evaluate left ventricular wall motion, and estimate left ventricular ejection fraction.  COMPARISON:  None.  FINDINGS: The patient was stressed according to Lexiscan protocol for 3 min and 11 seconds. The resting heart rate of 71 beats per min rose to a maximal heart of 99 beats per min. The resting blood pressure of 116/53 rose to a maximum blood pressure of 120/56. The stress test was stopped due to protocol completion.  The baseline ECG shows normal sinus rhythm, 75 beats per min, with an isolated PAC. The stress ECG showed no diagnostic ST segment or T-wave abnormalities, nor any arrhythmias.  Analysis of the raw data revealed that the patient brought her arms down, distorting the perfusion imaging. A mild degree of motion artifact was also noted. Analysis of the perfusion images revealed significant diaphragmatic artifact. There is a fixed apical anteroseptal and apical defect extending to the base. This defect was moderate and in size and severe in intensity. It may very well be due to patient arm movement. Left ventricular systolic function was normal with a calculated LV EF of 72%. TID value was 0.79.  IMPRESSION: 1.  Abnormal Lexiscan Cardiolite stress test.  2. Fixed apical anteroseptal and apical defect extending to the base. It is unclear whether this defect is actually due to myocardial scar vs significant artifact caused by patient arm movement. Would consider re-imaging at a later point in time if deemed necessary.  3. Normal left ventricular systolic function, calculated LVEF 72%. Wall motion was abnormal in the apical region, but this may be due to the aforementioned reasons.  4.  No chest pain reported.  5.  EKG showed no diagnostic ST-T abnormalities.   Electronically Signed   By: Kate Sable   On:  05/27/2013 14:43    Microbiology: No results found for this or any previous visit (from the past 240 hour(s)).   Labs: Basic Metabolic Panel:  Recent Labs Lab 05/26/13 0010 05/26/13 8413 05/27/13 0538 05/27/13 2143 05/28/13 0650 05/28/13 2152  NA 138 134* 134*  --  134*  --  K 4.8 5.0 5.5*  --  5.2  --   CL 96 95* 97  --  97  --   CO2 28 23 25   --  28  --   GLUCOSE 159* 449* 389* 384* 297* 528*  BUN 31* 33* 37*  --  43*  --   CREATININE 1.60* 1.34* 1.12*  --  1.07  --   CALCIUM 8.7 8.5 8.8  --  8.9  --    Liver Function Tests:  Recent Labs Lab 05/26/13 0010 05/26/13 0632  AST 13 12  ALT 6 6  ALKPHOS 114 116  BILITOT 0.2* 0.2*  PROT 6.7 6.6  ALBUMIN 3.2* 3.1*   No results found for this basename: LIPASE, AMYLASE,  in the last 168 hours No results found for this basename: AMMONIA,  in the last 168 hours CBC:  Recent Labs Lab 05/26/13 0010 05/26/13 0632 05/27/13 0538 05/28/13 0650  WBC 10.2 6.0 11.4* 10.9*  HGB 12.1 11.2* 10.8* 10.3*  HCT 36.1 33.7* 31.9* 32.1*  MCV 88.7 88.5 87.4 88.4  PLT 343 328 312 317   Cardiac Enzymes:  Recent Labs Lab 05/26/13 0010 05/26/13 0632 05/26/13 1248 05/26/13 1801  TROPONINI <0.30 <0.30 <0.30 <0.30   BNP: BNP (last 3 results)  Recent Labs  04/14/13 1315 05/26/13 0010  PROBNP 14124.0* 135.9*   CBG:  Recent Labs Lab 05/29/13 0737 05/29/13 1147 05/29/13 1641 05/29/13 2046 05/30/13 0723  GLUCAP 217* 375* 240* 259* 260*       Signed:  Rowe Clack N  [facility] 05/30/2013, 11:05 AM

## 2013-05-31 ENCOUNTER — Ambulatory Visit: Payer: Medicare Other | Admitting: Cardiology

## 2013-05-31 ENCOUNTER — Encounter: Payer: Self-pay | Admitting: *Deleted

## 2013-07-19 ENCOUNTER — Encounter: Payer: Self-pay | Admitting: Cardiology

## 2013-07-19 ENCOUNTER — Encounter (INDEPENDENT_AMBULATORY_CARE_PROVIDER_SITE_OTHER): Payer: Self-pay

## 2013-07-19 ENCOUNTER — Ambulatory Visit (INDEPENDENT_AMBULATORY_CARE_PROVIDER_SITE_OTHER): Payer: Medicare Other | Admitting: Cardiology

## 2013-07-19 VITALS — BP 122/58 | HR 90 | Ht 65.0 in | Wt 243.0 lb

## 2013-07-19 DIAGNOSIS — R079 Chest pain, unspecified: Secondary | ICD-10-CM

## 2013-07-19 DIAGNOSIS — I5032 Chronic diastolic (congestive) heart failure: Secondary | ICD-10-CM

## 2013-07-19 DIAGNOSIS — I10 Essential (primary) hypertension: Secondary | ICD-10-CM

## 2013-07-19 NOTE — Patient Instructions (Signed)
Your physician wants you to follow-up in: 6 months You will receive a reminder letter in the mail two months in advance. If you don't receive a letter, please call our office to schedule the follow-up appointment.    Your physician has recommended you make the following change in your medication:   Decrease aspirin dose to 81 mg daily    Thank you for choosing [facility] !

## 2013-07-19 NOTE — Progress Notes (Signed)
Clinical Summary Stephanie Combs is a 63 y.o.female seen today for follow up of the following medical problems.    1. Diastolic heart failure - compliant with meds, stable SOB. Occas LE edema, sleeps chronically with 2 pillows.  2. COPD - compliant with inhalers - has home O2 as needed  3. HTN - compliant with meds  4. Chest pain - recent admit 05/2013 with chest pain, negative cardiac enzymes - 05/2013 MPI: affected by arm movement, defect thought to be artifact. - no recent chest pain since discharge    Past Medical History  Diagnosis Date  . Hypertension   . Diabetes mellitus without complication   . COPD (chronic obstructive pulmonary disease)   . CHF (congestive heart failure)     Diastolic  . Anxiety   . Chronic back pain   . Hypothyroidism   . Pedal edema   . On home O2     2.5 L N/C prn     Allergies  Allergen Reactions  . Dilaudid [Hydromorphone Hcl]   . Oxycontin [Oxycodone]   . Reglan [Metoclopramide]   . Valium [Diazepam]   . Vistaril [Hydroxyzine Hcl]      Current Outpatient Prescriptions  Medication Sig Dispense Refill  . albuterol (PROAIR HFA) 108 (90 BASE) MCG/ACT inhaler Inhale 1 puff into the lungs every 4 (four) hours as needed. For shortness of breath  1 Inhaler  1  . alprazolam (XANAX) 2 MG tablet Take 0.5-1 tablets (1-2 mg total) by mouth 4 (four) times daily as needed. Sometimes patient only takes 1mg , and she does not take 4 daily unless needed for anxiety  30 tablet  0  . aspirin 325 MG EC tablet Take 325 mg by mouth daily. Ecotrin OTC      . clotrimazole (GYNE-LOTRIMIN) 1 % vaginal cream Place 1 Applicatorful vaginally at bedtime.  45 g  0  . diltiazem (CARDIZEM CD) 240 MG 24 hr capsule Take 1 capsule (240 mg total) by mouth daily.  30 capsule  2  . docusate sodium 100 MG CAPS Take 100 mg by mouth 2 (two) times daily.  10 capsule  0  . furosemide (LASIX) 20 MG tablet Take 3 tablets (60 mg total) by mouth 2 (two) times daily.  180  tablet  1  . gabapentin (NEURONTIN) 600 MG tablet Take 1 tablet (600 mg total) by mouth 4 (four) times daily.  60 tablet  2  . glipiZIDE-metformin (METAGLIP) 5-500 MG per tablet Take 1 tablet by mouth 2 (two) times daily before a meal.      . HYDROcodone-acetaminophen (NORCO/VICODIN) 5-325 MG per tablet Take 1 tablet by mouth every 4 (four) hours as needed.  30 tablet  0  . insulin aspart (NOVOLOG) 100 UNIT/ML injection Inject 0-20 Units into the skin 3 (three) times daily with meals.  10 mL  11  . insulin aspart (NOVOLOG) 100 UNIT/ML injection Inject 6 Units into the skin 3 (three) times daily with meals.  10 mL  11  . insulin detemir (LEVEMIR) 100 UNIT/ML injection Inject 0.6 mLs (60 Units total) into the skin daily.  10 mL  12  . ipratropium-albuterol (DUONEB) 0.5-2.5 (3) MG/3ML SOLN Take 3 mLs by nebulization every 6 (six) hours.  360 mL  2  . levothyroxine (SYNTHROID, LEVOTHROID) 125 MCG tablet Take 2 tablets (250 mcg total) by mouth daily before breakfast.  30 tablet  2  . meclizine (ANTIVERT) 25 MG tablet Take 25 mg by mouth 2 (two) times  daily.      . nicotine (NICODERM CQ - DOSED IN MG/24 HOURS) 21 mg/24hr patch Place 1 patch (21 mg total) onto the skin daily.  28 patch  0  . Omega-3 Fatty Acids (FISH OIL PO) Take 1 capsule by mouth daily.      . pantoprazole (PROTONIX) 40 MG tablet Take 1 tablet (40 mg total) by mouth 2 (two) times daily.  60 tablet  0  . potassium chloride (K-DUR) 10 MEQ tablet Take 25 mEq by mouth daily.      Marland Kitchen senna (SENOKOT) 8.6 MG TABS tablet Take 1 tablet (8.6 mg total) by mouth daily.  120 each  0   No current facility-administered medications for this visit.     Past Surgical History  Procedure Laterality Date  . Tubal ligation    . Hernia repair       Allergies  Allergen Reactions  . Dilaudid [Hydromorphone Hcl]   . Oxycontin [Oxycodone]   . Reglan [Metoclopramide]   . Valium [Diazepam]   . Vistaril [Hydroxyzine Hcl]       No family history on  file.   Social History Ms. Admire reports that she has been smoking Cigarettes.  She has been smoking about 0.25 packs per day. She does not have any smokeless tobacco history on file. Ms. Chatham reports that she does not drink alcohol.   Review of Systems CONSTITUTIONAL: No weight loss, fever, chills, weakness or fatigue.  HEENT: Eyes: No visual loss, blurred vision, double vision or yellow sclerae.No hearing loss, sneezing, congestion, runny nose or sore throat.  SKIN: No rash or itching.  CARDIOVASCULAR: per HPI RESPIRATORY: chronic SOB  GASTROINTESTINAL: No anorexia, nausea, vomiting or diarrhea. No abdominal pain or blood.  GENITOURINARY: No burning on urination, no polyuria NEUROLOGICAL: No headache, dizziness, syncope, paralysis, ataxia, numbness or tingling in the extremities. No change in bowel or bladder control.  MUSCULOSKELETAL: No muscle, back pain, joint pain or stiffness.  LYMPHATICS: No enlarged nodes. No history of splenectomy.  PSYCHIATRIC: No history of depression or anxiety.  ENDOCRINOLOGIC: No reports of sweating, cold or heat intolerance. No polyuria or polydipsia.  Marland Kitchen   Physical Examination p 90 bp 122/58 Wt 242 lbs BMI 40 Gen: resting comfortably, no acute distress HEENT: no scleral icterus, pupils equal round and reactive, no palptable cervical adenopathy,  CV: RRR, no m/r/g, no JVD Resp: Clear to auscultation bilaterally GI: abdomen is soft, non-tender, non-distended, normal bowel sounds, no hepatosplenomegaly MSK: extremities are warm, no edema.  Skin: warm, no rash Neuro:  no focal deficits Psych: appropriate affect   Diagnostic Studies 1.Echo 08/09/2012  Left ventricle: The cavity size was normal. There was moderate focal basal hypertrophy. Systolic function was vigorous. The estimated ejection fraction was in the range of 65% to 70%. Although no diagnostic regional wall motion abnormality was identified, this possibility cannot be completely  excluded on the basis of this study. Features are consistent with a pseudonormal left ventricular filling pattern, with concomitant abnormal relaxation and increased filling pressure (grade 2 diastolic dysfunction). Doppler parameters are consistent with elevated ventricular end-diastolic filling pressure. No previous study for comparison. - Aortic valve: Poorly visualized. Mildly calcified annulus. Mildly calcified leaflets. Transvalvular velocity was within the normal range. - Mitral valve: Calcified annulus. Mild regurgitation. - Left atrium: The atrium was mildly to moderately dilated. - Right ventricle: The cavity size was mildly dilated. Wall thickness was mildly increased. Systolic function was mildly reduced. - Right atrium: Central venous pressure: 93mm Hg (est). -  Tricuspid valve: Mild regurgitation. - Pulmonary arteries: Systolic pressure was at least moderately increased. PA peak pressure: 45mm Hg (S). - Pericardium, extracardiac: There was no pericardial effusion.  Jan 2015 MPI FINDINGS: The patient was stressed according to Lexiscan protocol for 3 min and 11 seconds. The resting heart rate of 71 beats per min rose to a maximal heart of 99 beats per min. The resting blood pressure of 116/53 rose to a maximum blood pressure of 120/56. The stress test was stopped due to protocol completion.  The baseline ECG shows normal sinus rhythm, 75 beats per min, with an isolated PAC. The stress ECG showed no diagnostic ST segment or T-wave abnormalities, nor any arrhythmias.  Analysis of the raw data revealed that the patient brought her arms down, distorting the perfusion imaging. A mild degree of motion artifact was also noted. Analysis of the perfusion images revealed significant diaphragmatic artifact. There is a fixed apical anteroseptal and apical defect extending to the base. This defect was moderate and in size and severe in intensity. It may very well be due to  patient arm movement. Left ventricular systolic function was normal with a calculated LV EF of 72%. TID value was 0.79.  IMPRESSION: 1. Abnormal Lexiscan Cardiolite stress test.  2. Fixed apical anteroseptal and apical defect extending to the base. It is unclear whether this defect is actually due to myocardial scar vs significant artifact caused by patient arm movement. Would consider re-imaging at a later point in time if deemed necessary.  3. Normal left ventricular systolic function, calculated LVEF 72%. Wall motion was abnormal in the apical region, but this may be due to the aforementioned reasons.  4. No chest pain reported.  5. EKG showed no diagnostic ST-T abnormalities.   04/2013 Echo LVEF 36-62%, grade I diastolic dysfunction, RV mild to moderately dilated, PASP 58 mmHg.    Assessment and Plan  1. Chronic diastolic heart failure - appears euvolemic today, continue current meds  2. COPD - continue home O2 and inhalers  3. HTN - at goal, continue current meds  4. Chest pain - recent stress test without clear evidence of ischemia, no recurrent symptoms - continue to follow        Arnoldo Lenis, M.D., F.A.C.C.

## 2013-08-13 ENCOUNTER — Encounter (HOSPITAL_COMMUNITY): Payer: Self-pay | Admitting: Emergency Medicine

## 2013-08-13 ENCOUNTER — Emergency Department (HOSPITAL_COMMUNITY)
Admission: EM | Admit: 2013-08-13 | Discharge: 2013-08-13 | Disposition: A | Discharge status: Home / Self Care | Payer: Medicare Other | Attending: Emergency Medicine | Admitting: Emergency Medicine

## 2013-08-13 ENCOUNTER — Emergency Department (HOSPITAL_COMMUNITY): Payer: Medicare Other

## 2013-08-13 DIAGNOSIS — J449 Chronic obstructive pulmonary disease, unspecified: Secondary | ICD-10-CM

## 2013-08-13 DIAGNOSIS — E039 Hypothyroidism, unspecified: Secondary | ICD-10-CM | POA: Insufficient documentation

## 2013-08-13 DIAGNOSIS — Z9981 Dependence on supplemental oxygen: Secondary | ICD-10-CM | POA: Insufficient documentation

## 2013-08-13 DIAGNOSIS — I5030 Unspecified diastolic (congestive) heart failure: Secondary | ICD-10-CM | POA: Insufficient documentation

## 2013-08-13 DIAGNOSIS — R51 Headache: Secondary | ICD-10-CM | POA: Insufficient documentation

## 2013-08-13 DIAGNOSIS — R600 Localized edema: Secondary | ICD-10-CM

## 2013-08-13 DIAGNOSIS — G8929 Other chronic pain: Secondary | ICD-10-CM | POA: Insufficient documentation

## 2013-08-13 DIAGNOSIS — F411 Generalized anxiety disorder: Secondary | ICD-10-CM | POA: Insufficient documentation

## 2013-08-13 DIAGNOSIS — N289 Disorder of kidney and ureter, unspecified: Secondary | ICD-10-CM

## 2013-08-13 DIAGNOSIS — E162 Hypoglycemia, unspecified: Secondary | ICD-10-CM

## 2013-08-13 DIAGNOSIS — F17200 Nicotine dependence, unspecified, uncomplicated: Secondary | ICD-10-CM | POA: Insufficient documentation

## 2013-08-13 DIAGNOSIS — Z7982 Long term (current) use of aspirin: Secondary | ICD-10-CM | POA: Insufficient documentation

## 2013-08-13 DIAGNOSIS — IMO0002 Reserved for concepts with insufficient information to code with codable children: Secondary | ICD-10-CM | POA: Insufficient documentation

## 2013-08-13 DIAGNOSIS — Z794 Long term (current) use of insulin: Secondary | ICD-10-CM | POA: Insufficient documentation

## 2013-08-13 DIAGNOSIS — R609 Edema, unspecified: Secondary | ICD-10-CM | POA: Insufficient documentation

## 2013-08-13 DIAGNOSIS — E1169 Type 2 diabetes mellitus with other specified complication: Secondary | ICD-10-CM | POA: Insufficient documentation

## 2013-08-13 DIAGNOSIS — E8779 Other fluid overload: Secondary | ICD-10-CM | POA: Insufficient documentation

## 2013-08-13 DIAGNOSIS — J441 Chronic obstructive pulmonary disease with (acute) exacerbation: Secondary | ICD-10-CM | POA: Insufficient documentation

## 2013-08-13 DIAGNOSIS — Z79899 Other long term (current) drug therapy: Secondary | ICD-10-CM | POA: Insufficient documentation

## 2013-08-13 DIAGNOSIS — I10 Essential (primary) hypertension: Secondary | ICD-10-CM | POA: Insufficient documentation

## 2013-08-13 DIAGNOSIS — M79609 Pain in unspecified limb: Secondary | ICD-10-CM | POA: Insufficient documentation

## 2013-08-13 LAB — CBC WITH DIFFERENTIAL/PLATELET
Basophils Absolute: 0.1 10*3/uL (ref 0.0–0.1)
Basophils Relative: 1 % (ref 0–1)
Eosinophils Absolute: 0.2 10*3/uL (ref 0.0–0.7)
Eosinophils Relative: 2 % (ref 0–5)
HCT: 34.2 % — ABNORMAL LOW (ref 36.0–46.0)
Hemoglobin: 11 g/dL — ABNORMAL LOW (ref 12.0–15.0)
Lymphocytes Relative: 35 % (ref 12–46)
Lymphs Abs: 4.1 10*3/uL — ABNORMAL HIGH (ref 0.7–4.0)
MCH: 26.9 pg (ref 26.0–34.0)
MCHC: 32.2 g/dL (ref 30.0–36.0)
MCV: 83.6 fL (ref 78.0–100.0)
Monocytes Absolute: 1 10*3/uL (ref 0.1–1.0)
Monocytes Relative: 9 % (ref 3–12)
Neutro Abs: 6.1 10*3/uL (ref 1.7–7.7)
Neutrophils Relative %: 53 % (ref 43–77)
Platelets: 328 10*3/uL (ref 150–400)
RBC: 4.09 MIL/uL (ref 3.87–5.11)
RDW: 14.5 % (ref 11.5–15.5)
WBC: 11.5 10*3/uL — ABNORMAL HIGH (ref 4.0–10.5)

## 2013-08-13 LAB — URINE MICROSCOPIC-ADD ON

## 2013-08-13 LAB — TROPONIN I: Troponin I: <0.3 ng/mL (ref <0.30)

## 2013-08-13 LAB — URINALYSIS, ROUTINE W REFLEX MICROSCOPIC
Bilirubin Urine: NEGATIVE
Glucose, UA: NEGATIVE mg/dL
Ketones, ur: NEGATIVE mg/dL
Nitrite: NEGATIVE
Protein, ur: NEGATIVE mg/dL
Specific Gravity, Urine: <1.005 — ABNORMAL LOW (ref 1.005–1.030)
Urobilinogen, UA: 0.2 mg/dL (ref 0.0–1.0)
pH: 6 (ref 5.0–8.0)

## 2013-08-13 LAB — COMPREHENSIVE METABOLIC PANEL
ALT: 8 U/L (ref 0–35)
AST: 16 U/L (ref 0–37)
Albumin: 3.3 g/dL — ABNORMAL LOW (ref 3.5–5.2)
Alkaline Phosphatase: 97 U/L (ref 39–117)
BUN: 36 mg/dL — ABNORMAL HIGH (ref 6–23)
CO2: 29 mEq/L (ref 19–32)
Calcium: 9.4 mg/dL (ref 8.4–10.5)
Chloride: 96 mEq/L (ref 96–112)
Creatinine, Ser: 1.72 mg/dL — ABNORMAL HIGH (ref 0.50–1.10)
GFR calc Af Amer: 35 mL/min — ABNORMAL LOW (ref >90)
GFR calc non Af Amer: 30 mL/min — ABNORMAL LOW (ref >90)
Glucose, Bld: 59 mg/dL — ABNORMAL LOW (ref 70–99)
Potassium: 3.8 mEq/L (ref 3.7–5.3)
Sodium: 138 mEq/L (ref 137–147)
Total Bilirubin: 0.2 mg/dL — ABNORMAL LOW (ref 0.3–1.2)
Total Protein: 6.9 g/dL (ref 6.0–8.3)

## 2013-08-13 LAB — CBG MONITORING, ED: Glucose-Capillary: 113 mg/dL — ABNORMAL HIGH (ref 70–99)

## 2013-08-13 LAB — PRO B NATRIURETIC PEPTIDE: Pro B Natriuretic peptide (BNP): 198 pg/mL — ABNORMAL HIGH (ref 0–125)

## 2013-08-13 MED ORDER — ALBUTEROL SULFATE (2.5 MG/3ML) 0.083% IN NEBU
5.0000 mg | INHALATION_SOLUTION | Freq: Once | RESPIRATORY_TRACT | Status: AC
  Administered 2013-08-13: 5 mg via RESPIRATORY_TRACT
  Filled 2013-08-13: qty 6

## 2013-08-13 MED ORDER — PREDNISONE 20 MG PO TABS
40.0000 mg | ORAL_TABLET | Freq: Once | ORAL | Status: AC
  Administered 2013-08-13: 40 mg via ORAL
  Filled 2013-08-13: qty 2

## 2013-08-13 MED ORDER — PREDNISONE 20 MG PO TABS: 40.0000 mg | ORAL_TABLET | Freq: Every day | ORAL | Status: DC

## 2013-08-13 MED ORDER — FUROSEMIDE 10 MG/ML IJ SOLN
40.0000 mg | INTRAMUSCULAR | Status: AC
  Administered 2013-08-13: 40 mg via INTRAVENOUS
  Filled 2013-08-13: qty 4

## 2013-08-13 MED ORDER — KETOROLAC TROMETHAMINE 30 MG/ML IJ SOLN
30.0000 mg | Freq: Once | INTRAMUSCULAR | Status: AC
  Administered 2013-08-13: 30 mg via INTRAVENOUS
  Filled 2013-08-13: qty 1

## 2013-08-13 MED ORDER — ALBUTEROL SULFATE HFA 108 (90 BASE) MCG/ACT IN AERS
2.0000 | INHALATION_SPRAY | Freq: Once | RESPIRATORY_TRACT | Status: AC
  Administered 2013-08-13: 2 via RESPIRATORY_TRACT
  Filled 2013-08-13: qty 6.7

## 2013-08-13 MED ORDER — NAPROXEN 500 MG PO TABS: 500.0000 mg | ORAL_TABLET | Freq: Two times a day (BID) | ORAL | Status: DC

## 2013-08-13 NOTE — ED Notes (Signed)
Ordered pt meal tray, glucose 59. Pt also requesting graham crackers and peanut butter.

## 2013-08-13 NOTE — ED Notes (Signed)
Pt alert & oriented x4, stable gait. Patient given discharge instructions, paperwork & prescription(s). Patient  instructed to stop at the registration desk to finish any additional paperwork. Patient verbalized understanding. Pt left department w/ no further questions. 

## 2013-08-13 NOTE — ED Notes (Signed)
Increased fluid retention for past 2 weeks, increasing sob tonight. "I need some IV lasix"

## 2013-08-13 NOTE — ED Provider Notes (Signed)
CSN: [id number]     Arrival date & time 08/13/13  0459 History   First MD Initiated Contact with Patient 08/13/13 0501     Chief Complaint  Patient presents with  . Shortness of Breath     (Consider location/radiation/quality/duration/timing/severity/associated sxs/prior Treatment) HPI Comments: 63 y/o morbidly Obese female with hx of Grade 1 diastolic CHF presents with 2 weeks of gradual onset, persistent SOB which is gradually worsening and associated with increased swelling of the bilateral LE's.  She has no fever but has had a cough.  She has hx of swelling for which she takes lasix, is a diabetic and has had good control of CBG according to her report.  She does have an associated headache this evening.  According to cardiolgoy clinic notes, the pt had Lexiscan stress done 3 months ago - equivocal due to the pt's inability to keep her arms elevated - may have caused abnormal findings to show up - no obvious known obstructive disease in the past.  Pt also c/o cramping of the legs from teh toes to the knees bilaterally despite taking all her K  Patient is a 63 y.o. female presenting with shortness of breath. The history is provided by the patient and medical records.  Shortness of Breath   Past Medical History  Diagnosis Date  . Hypertension   . Diabetes mellitus without complication   . COPD (chronic obstructive pulmonary disease)   . CHF (congestive heart failure)     Diastolic  . Anxiety   . Chronic back pain   . Hypothyroidism   . Pedal edema   . On home O2     2.5 L N/C prn   Past Surgical History  Procedure Laterality Date  . Tubal ligation    . Hernia repair     No family history on file. History  Substance Use Topics  . Smoking status: Current Every Day Smoker -- 0.25 packs/day    Types: Cigarettes  . Smokeless tobacco: Not on file  . Alcohol Use: No   OB History   Grav Para Term Preterm Abortions TAB SAB Ect Mult Living                 Review of Systems   Respiratory: Positive for shortness of breath.   All other systems reviewed and are negative.      Allergies  Dilaudid; Oxycontin; Reglan; Valium; and Vistaril  Home Medications   Current Outpatient Rx  Name  Route  Sig  Dispense  Refill  . albuterol (PROAIR HFA) 108 (90 BASE) MCG/ACT inhaler   Inhalation   Inhale 1 puff into the lungs every 4 (four) hours as needed. For shortness of breath   1 Inhaler   1   . alprazolam (XANAX) 2 MG tablet   Oral   Take 0.5-1 tablets (1-2 mg total) by mouth 4 (four) times daily as needed. Sometimes patient only takes 1mg , and she does not take 4 daily unless needed for anxiety   30 tablet   0   . aspirin 81 MG tablet   Oral   Take 81 mg by mouth daily.         . clotrimazole (GYNE-LOTRIMIN) 1 % vaginal cream   Vaginal   Place 1 Applicatorful vaginally at bedtime.   45 g   0   . diltiazem (CARDIZEM CD) 240 MG 24 hr capsule   Oral   Take 1 capsule (240 mg total) by mouth daily.   30 capsule  2   . docusate sodium 100 MG CAPS   Oral   Take 100 mg by mouth 2 (two) times daily.   10 capsule   0   . furosemide (LASIX) 20 MG tablet   Oral   Take 3 tablets (60 mg total) by mouth 2 (two) times daily.   180 tablet   1   . gabapentin (NEURONTIN) 600 MG tablet   Oral   Take 1 tablet (600 mg total) by mouth 4 (four) times daily.   60 tablet   2   . glipiZIDE-metformin (METAGLIP) 5-500 MG per tablet   Oral   Take 1 tablet by mouth 2 (two) times daily before a meal.         . HYDROcodone-acetaminophen (NORCO/VICODIN) 5-325 MG per tablet   Oral   Take 1 tablet by mouth every 4 (four) hours as needed.   30 tablet   0   . insulin aspart (NOVOLOG) 100 UNIT/ML injection   Subcutaneous   Inject 0-20 Units into the skin 3 (three) times daily with meals.   10 mL   11     CBG 70 - 120: 0 units CBG 121 - 150: 3 units CBG ...   . insulin aspart (NOVOLOG) 100 UNIT/ML injection   Subcutaneous   Inject 6 Units into the  skin 3 (three) times daily with meals.   10 mL   11   . insulin detemir (LEVEMIR) 100 UNIT/ML injection   Subcutaneous   Inject 0.6 mLs (60 Units total) into the skin daily.   10 mL   12   . ipratropium-albuterol (DUONEB) 0.5-2.5 (3) MG/3ML SOLN   Nebulization   Take 3 mLs by nebulization every 6 (six) hours.   360 mL   2   . levothyroxine (SYNTHROID, LEVOTHROID) 125 MCG tablet   Oral   Take 2 tablets (250 mcg total) by mouth daily before breakfast.   30 tablet   2   . meclizine (ANTIVERT) 25 MG tablet   Oral   Take 25 mg by mouth 2 (two) times daily.         . naproxen (NAPROSYN) 500 MG tablet   Oral   Take 1 tablet (500 mg total) by mouth 2 (two) times daily with a meal.   30 tablet   0   . nicotine (NICODERM CQ - DOSED IN MG/24 HOURS) 21 mg/24hr patch   Transdermal   Place 1 patch (21 mg total) onto the skin daily.   28 patch   0   . Omega-3 Fatty Acids (FISH OIL PO)   Oral   Take 1 capsule by mouth daily.         Marland Kitchen oxyCODONE-acetaminophen (PERCOCET) 10-325 MG per tablet               . pantoprazole (PROTONIX) 40 MG tablet   Oral   Take 1 tablet (40 mg total) by mouth 2 (two) times daily.   60 tablet   0   . potassium chloride (K-DUR) 10 MEQ tablet   Oral   Take 25 mEq by mouth daily.         . predniSONE (DELTASONE) 20 MG tablet   Oral   Take 2 tablets (40 mg total) by mouth daily.   10 tablet   0   . senna (SENOKOT) 8.6 MG TABS tablet   Oral   Take 1 tablet (8.6 mg total) by mouth daily.   120 each   0  BP 121/96  Pulse 86  Temp(Src) 98 F (36.7 C)  Resp 19  Ht 5\' 6"  (1.676 m)  Wt 252 lb (114.306 kg)  BMI 40.69 kg/m2  SpO2 98% Physical Exam  Nursing note and vitals reviewed. Constitutional: She appears well-developed and well-nourished.  Slight tachypnea, no other distress  HENT:  Head: Normocephalic and atraumatic.  Mouth/Throat: Oropharynx is clear and moist. No oropharyngeal exudate.  Eyes: Conjunctivae and EOM are  normal. Pupils are equal, round, and reactive to light. Right eye exhibits no discharge. Left eye exhibits no discharge. No scleral icterus.  Neck: Normal range of motion. Neck supple. No JVD present. No thyromegaly present.  Cardiovascular: Normal rate, regular rhythm, normal heart sounds and intact distal pulses.  Exam reveals no gallop and no friction rub.   No murmur heard. Pulmonary/Chest: No respiratory distress. She has wheezes ( bilateral expiratory). She has no rales.  Mild tachypnea - speaks in full sentences, no rales.  Bilateral wheezing  Abdominal: Soft. Bowel sounds are normal. She exhibits no distension and no mass. There is no tenderness.  Diffusely soft and nontender abdomen.  Musculoskeletal: Normal range of motion. She exhibits edema. She exhibits no tenderness.  Severely obese LE's.  No pitting edema.  symmetrical  Lymphadenopathy:    She has no cervical adenopathy.  Neurological: She is alert. Coordination normal.  Skin: Skin is warm and dry. No rash noted. No erythema.  Psychiatric: She has a normal mood and affect. Her behavior is normal.    ED Course  Procedures (including critical care time) Labs Review Labs Reviewed  PRO B NATRIURETIC PEPTIDE - Abnormal; Notable for the following:    Pro B Natriuretic peptide (BNP) 198.0 (*)    All other components within normal limits  CBC WITH DIFFERENTIAL - Abnormal; Notable for the following:    WBC 11.5 (*)    Hemoglobin 11.0 (*)    HCT 34.2 (*)    Lymphs Abs 4.1 (*)    All other components within normal limits  COMPREHENSIVE METABOLIC PANEL - Abnormal; Notable for the following:    Glucose, Bld 59 (*)    BUN 36 (*)    Creatinine, Ser 1.72 (*)    Albumin 3.3 (*)    Total Bilirubin 0.2 (*)    GFR calc non Af Amer 30 (*)    GFR calc Af Amer 35 (*)    All other components within normal limits  URINALYSIS, ROUTINE W REFLEX MICROSCOPIC - Abnormal; Notable for the following:    Color, Urine STRAW (*)    Specific  Gravity, Urine <1.005 (*)    Hgb urine dipstick SMALL (*)    Leukocytes, UA TRACE (*)    All other components within normal limits  URINE MICROSCOPIC-ADD ON - Abnormal; Notable for the following:    Bacteria, UA FEW (*)    Casts HYALINE CASTS (*)    All other components within normal limits  TROPONIN I   Imaging Review Dg Chest Port 1 View  08/13/2013   CLINICAL DATA:  Increased fluid retention for 2 weeks. Worsening shortness of breath.  EXAM: PORTABLE CHEST - 1 VIEW  COMPARISON:  Chest radiograph performed 05/26/2013  FINDINGS: The lungs are well-aerated. Mild vascular congestion is noted. There is no evidence of focal opacification, pleural effusion or pneumothorax.  The cardiomediastinal silhouette is within normal limits. No acute osseous abnormalities are seen.  IMPRESSION: Mild vascular congestion noted; lungs remain grossly clear.   Electronically Signed   By: Garald Balding  M.D.   On: 08/13/2013 05:54     EKG Interpretation   Date/Time:  Saturday August 13 2013 05:02:41 EDT Ventricular Rate:  85 PR Interval:  172 QRS Duration: 88 QT Interval:  390 QTC Calculation: 464 R Axis:   76 Text Interpretation:  Normal sinus rhythm Normal ECG When compared with  ECG of 26-May-2013 00:01, No significant change was found Confirmed by  Joran Kallal  MD, Kailah Pennel ([id number]) on 08/13/2013 5:17:27 AM      MDM   Final diagnoses:  Hypoglycemia  Renal insufficiency  COPD (chronic obstructive pulmonary disease)  Fluid retention in legs    The pt has SOB and headache - she appears to have increased fluid retention, need xray to r/o pneumonia though history is more consistent with CHF with fluid retention.  She requests IV lasix and d/c but is willing to stay for some additional testing.  The pt has several abnormal findings on lab work.  #1 - low blood sugar - food given, tolerated without difficulty, encouraged to check CBG frequently today.  #2 - Cr elevated to 1.7 - has hx of some fluctuation in  the level, will f/u in office for same.  BNP unremarkable and CXR without edema or consolidation - no fever or tachycarida and no hypoxia.  Given lasix with some improvement in SOB, albuterol for her SOb and prednisone.  Pt informed of all results and states she wants to go home with f/u.  MDI given for home.  Meds given in ED:  Medications  albuterol (PROVENTIL HFA;VENTOLIN HFA) 108 (90 BASE) MCG/ACT inhaler 2 puff (not administered)  predniSONE (DELTASONE) tablet 40 mg (not administered)  furosemide (LASIX) injection 40 mg (40 mg Intravenous Given 08/13/13 0538)  ketorolac (TORADOL) 30 MG/ML injection 30 mg (30 mg Intravenous Given 08/13/13 0539)  albuterol (PROVENTIL) (2.5 MG/3ML) 0.083% nebulizer solution 5 mg (5 mg Nebulization Given 08/13/13 0610)    New Prescriptions   NAPROXEN (NAPROSYN) 500 MG TABLET    Take 1 tablet (500 mg total) by mouth 2 (two) times daily with a meal.   PREDNISONE (DELTASONE) 20 MG TABLET    Take 2 tablets (40 mg total) by mouth daily.      Johnna Acosta, MD 08/13/13 0700

## 2013-08-13 NOTE — Discharge Instructions (Signed)
Please take the prednisone once daily for the wheezing and COPD - use the inhaler 2 puffs every 4 hours for 24 hours, then 2 puffs every 4 hours as needed.  Your blood sugar was slightly low - have this checked every 2 hours at home and eat a health diabetic diet  Your kidney tests need to be rechecked at your doctors office - Your Cr today was 1.7  Please call your doctor for a followup appointment within 24-48 hours. When you talk to your doctor please let them know that you were seen in the emergency department and have them acquire all of your records so that they can discuss the findings with you and formulate a treatment plan to fully care for your new and ongoing problems.

## 2013-08-13 NOTE — ED Notes (Signed)
Pt's glucose improved, pt calling for a ride at this time.

## 2013-09-10 ENCOUNTER — Telehealth: Payer: Self-pay | Admitting: Surgery

## 2013-09-10 NOTE — Telephone Encounter (Signed)
Message copied by Laurena Slimmer on Sat Sep 10, 2013  2:44 PM ------      Message from: Noemi Chapel      Created: Sat Aug 13, 2013  6:55 AM      Regarding: Order for JISELLE, SHEU             Patient Name: Stephanie Combs, MOMON J([id number])      Sex: Female      DOB: 01/02/1951              PCP: Neale Burly        Center: [facility]             Types of orders made on 08/13/2013: Consult, ECG, Imaging, Lab, Medications,                                           Nursing            Order Date:08/13/2013      Ordering User:MILLER, [place] [[id number]]      Attending Provider:Brian Amparo [place], MD [phone]      Authorizing Provider: Johnna Acosta, MD [[id number]]      Department:[facility][[id number]]            Order Specific Information      Order: COPD clinic follow up [Custom: [id number]]  Order #: [id number]  Qty: 1        Priority: Routine  Class: Hospital Performed          Where does the patient receive follow up COPD care -> Other                 Follow up in -> 5-7 days               Released on: 08/13/2013  6:55 AM                    Priority: Routine  Class: Hospital Performed          Where does the patient receive follow up COPD care -> Other                 Follow up in -> 5-7 days               Released on: 08/13/2013  6:55 AM       ------

## 2013-09-10 NOTE — Telephone Encounter (Signed)
ED CM contacted patient by phone concerning ED f/u for COPD on 08/13/13. Pt reports she f/u with her PCP who manages her COPD Dr. Donnamarie Rossetti 4/7. No further CM needs identified.

## 2013-10-02 ENCOUNTER — Encounter (HOSPITAL_COMMUNITY): Payer: Self-pay | Admitting: Emergency Medicine

## 2013-10-02 ENCOUNTER — Emergency Department (HOSPITAL_COMMUNITY): Payer: Medicare Other

## 2013-10-02 ENCOUNTER — Emergency Department (HOSPITAL_COMMUNITY)
Admission: EM | Admit: 2013-10-02 | Discharge: 2013-10-02 | Disposition: A | Discharge status: Home / Self Care | Payer: Medicare Other | Attending: Emergency Medicine | Admitting: Emergency Medicine

## 2013-10-02 DIAGNOSIS — J449 Chronic obstructive pulmonary disease, unspecified: Secondary | ICD-10-CM | POA: Insufficient documentation

## 2013-10-02 DIAGNOSIS — E119 Type 2 diabetes mellitus without complications: Secondary | ICD-10-CM | POA: Insufficient documentation

## 2013-10-02 DIAGNOSIS — F3289 Other specified depressive episodes: Secondary | ICD-10-CM | POA: Insufficient documentation

## 2013-10-02 DIAGNOSIS — Z794 Long term (current) use of insulin: Secondary | ICD-10-CM | POA: Insufficient documentation

## 2013-10-02 DIAGNOSIS — I10 Essential (primary) hypertension: Secondary | ICD-10-CM | POA: Insufficient documentation

## 2013-10-02 DIAGNOSIS — Z7982 Long term (current) use of aspirin: Secondary | ICD-10-CM | POA: Insufficient documentation

## 2013-10-02 DIAGNOSIS — I5030 Unspecified diastolic (congestive) heart failure: Secondary | ICD-10-CM | POA: Insufficient documentation

## 2013-10-02 DIAGNOSIS — F17200 Nicotine dependence, unspecified, uncomplicated: Secondary | ICD-10-CM | POA: Insufficient documentation

## 2013-10-02 DIAGNOSIS — Z8739 Personal history of other diseases of the musculoskeletal system and connective tissue: Secondary | ICD-10-CM | POA: Insufficient documentation

## 2013-10-02 DIAGNOSIS — F1310 Sedative, hypnotic or anxiolytic abuse, uncomplicated: Secondary | ICD-10-CM | POA: Insufficient documentation

## 2013-10-02 DIAGNOSIS — R519 Headache, unspecified: Secondary | ICD-10-CM

## 2013-10-02 DIAGNOSIS — G43909 Migraine, unspecified, not intractable, without status migrainosus: Secondary | ICD-10-CM | POA: Insufficient documentation

## 2013-10-02 DIAGNOSIS — J4489 Other specified chronic obstructive pulmonary disease: Secondary | ICD-10-CM | POA: Insufficient documentation

## 2013-10-02 DIAGNOSIS — Z79899 Other long term (current) drug therapy: Secondary | ICD-10-CM | POA: Insufficient documentation

## 2013-10-02 DIAGNOSIS — F32A Depression, unspecified: Secondary | ICD-10-CM

## 2013-10-02 DIAGNOSIS — R51 Headache: Secondary | ICD-10-CM

## 2013-10-02 DIAGNOSIS — E039 Hypothyroidism, unspecified: Secondary | ICD-10-CM | POA: Insufficient documentation

## 2013-10-02 DIAGNOSIS — G8929 Other chronic pain: Secondary | ICD-10-CM | POA: Insufficient documentation

## 2013-10-02 DIAGNOSIS — F329 Major depressive disorder, single episode, unspecified: Secondary | ICD-10-CM | POA: Insufficient documentation

## 2013-10-02 DIAGNOSIS — M7989 Other specified soft tissue disorders: Secondary | ICD-10-CM | POA: Insufficient documentation

## 2013-10-02 DIAGNOSIS — N390 Urinary tract infection, site not specified: Secondary | ICD-10-CM | POA: Insufficient documentation

## 2013-10-02 DIAGNOSIS — Z9981 Dependence on supplemental oxygen: Secondary | ICD-10-CM | POA: Insufficient documentation

## 2013-10-02 HISTORY — DX: Reserved for concepts with insufficient information to code with codable children: IMO0002

## 2013-10-02 LAB — URINALYSIS, ROUTINE W REFLEX MICROSCOPIC
Bilirubin Urine: NEGATIVE
Glucose, UA: NEGATIVE mg/dL
Ketones, ur: NEGATIVE mg/dL
Nitrite: NEGATIVE
Protein, ur: NEGATIVE mg/dL
Specific Gravity, Urine: 1.01 (ref 1.005–1.030)
Urobilinogen, UA: 0.2 mg/dL (ref 0.0–1.0)
pH: 6 (ref 5.0–8.0)

## 2013-10-02 LAB — TROPONIN I: Troponin I: <0.3 ng/mL (ref <0.30)

## 2013-10-02 LAB — URINE MICROSCOPIC-ADD ON

## 2013-10-02 LAB — RAPID URINE DRUG SCREEN, HOSP PERFORMED
Amphetamines: NOT DETECTED
Barbiturates: NOT DETECTED
Benzodiazepines: POSITIVE — AB
Cocaine: NOT DETECTED
Opiates: NOT DETECTED
Tetrahydrocannabinol: NOT DETECTED

## 2013-10-02 LAB — CBC WITH DIFFERENTIAL/PLATELET
Basophils Absolute: 0.1 10*3/uL (ref 0.0–0.1)
Basophils Relative: 1 % (ref 0–1)
Eosinophils Absolute: 0.3 10*3/uL (ref 0.0–0.7)
Eosinophils Relative: 3 % (ref 0–5)
HCT: 36 % (ref 36.0–46.0)
Hemoglobin: 11.4 g/dL — ABNORMAL LOW (ref 12.0–15.0)
Lymphocytes Relative: 39 % (ref 12–46)
Lymphs Abs: 3.4 10*3/uL (ref 0.7–4.0)
MCH: 26.3 pg (ref 26.0–34.0)
MCHC: 31.7 g/dL (ref 30.0–36.0)
MCV: 82.9 fL (ref 78.0–100.0)
Monocytes Absolute: 0.7 10*3/uL (ref 0.1–1.0)
Monocytes Relative: 8 % (ref 3–12)
Neutro Abs: 4.3 10*3/uL (ref 1.7–7.7)
Neutrophils Relative %: 49 % (ref 43–77)
Platelets: 260 10*3/uL (ref 150–400)
RBC: 4.34 MIL/uL (ref 3.87–5.11)
RDW: 16.3 % — ABNORMAL HIGH (ref 11.5–15.5)
WBC: 8.6 10*3/uL (ref 4.0–10.5)

## 2013-10-02 LAB — COMPREHENSIVE METABOLIC PANEL
ALT: 8 U/L (ref 0–35)
AST: 14 U/L (ref 0–37)
Albumin: 3.2 g/dL — ABNORMAL LOW (ref 3.5–5.2)
Alkaline Phosphatase: 92 U/L (ref 39–117)
BUN: 29 mg/dL — ABNORMAL HIGH (ref 6–23)
CO2: 30 mEq/L (ref 19–32)
Calcium: 9.3 mg/dL (ref 8.4–10.5)
Chloride: 97 mEq/L (ref 96–112)
Creatinine, Ser: 1.25 mg/dL — ABNORMAL HIGH (ref 0.50–1.10)
GFR calc Af Amer: 52 mL/min — ABNORMAL LOW (ref >90)
GFR calc non Af Amer: 45 mL/min — ABNORMAL LOW (ref >90)
Glucose, Bld: 89 mg/dL (ref 70–99)
Potassium: 3.6 mEq/L — ABNORMAL LOW (ref 3.7–5.3)
Sodium: 140 mEq/L (ref 137–147)
Total Bilirubin: 0.2 mg/dL — ABNORMAL LOW (ref 0.3–1.2)
Total Protein: 6.5 g/dL (ref 6.0–8.3)

## 2013-10-02 LAB — D-DIMER, QUANTITATIVE: D-Dimer, Quant: <0.27 ug/mL-FEU (ref 0.00–0.48)

## 2013-10-02 LAB — ETHANOL: Alcohol, Ethyl (B): <11 mg/dL (ref 0–11)

## 2013-10-02 LAB — CBG MONITORING, ED: Glucose-Capillary: 64 mg/dL — ABNORMAL LOW (ref 70–99)

## 2013-10-02 MED ORDER — CEPHALEXIN 500 MG PO CAPS
500.0000 mg | ORAL_CAPSULE | Freq: Three times a day (TID) | ORAL | Status: DC
  Administered 2013-10-02: 500 mg via ORAL
  Filled 2013-10-02 (×2): qty 1

## 2013-10-02 MED ORDER — ENOXAPARIN SODIUM 100 MG/ML ~~LOC~~ SOLN
100.0000 mg | Freq: Once | SUBCUTANEOUS | Status: AC
  Administered 2013-10-02: 100 mg via SUBCUTANEOUS
  Filled 2013-10-02: qty 1

## 2013-10-02 MED ORDER — VALPROATE SODIUM 500 MG/5ML IV SOLN
INTRAVENOUS | Status: AC
  Filled 2013-10-02: qty 10

## 2013-10-02 MED ORDER — FUROSEMIDE 10 MG/ML IJ SOLN
INTRAMUSCULAR | Status: AC
  Filled 2013-10-02: qty 10

## 2013-10-02 MED ORDER — MAGNESIUM SULFATE 40 MG/ML IJ SOLN
2.0000 g | Freq: Once | INTRAMUSCULAR | Status: AC
  Administered 2013-10-02: 2 g via INTRAVENOUS
  Filled 2013-10-02: qty 50

## 2013-10-02 MED ORDER — CEPHALEXIN 500 MG PO CAPS: 500.0000 mg | ORAL_CAPSULE | Freq: Three times a day (TID) | ORAL | Status: DC

## 2013-10-02 MED ORDER — DIPHENHYDRAMINE HCL 50 MG/ML IJ SOLN
25.0000 mg | Freq: Once | INTRAMUSCULAR | Status: AC
  Administered 2013-10-02: 25 mg via INTRAVENOUS
  Filled 2013-10-02: qty 1

## 2013-10-02 MED ORDER — LORAZEPAM 2 MG/ML IJ SOLN
1.0000 mg | Freq: Once | INTRAMUSCULAR | Status: AC
  Administered 2013-10-02: 1 mg via INTRAVENOUS
  Filled 2013-10-02: qty 1

## 2013-10-02 MED ORDER — VALPROATE SODIUM 500 MG/5ML IV SOLN
1000.0000 mg | Freq: Once | INTRAVENOUS | Status: AC
  Administered 2013-10-02: 1000 mg via INTRAVENOUS
  Filled 2013-10-02: qty 10

## 2013-10-02 MED ORDER — ONDANSETRON HCL 4 MG/2ML IJ SOLN
4.0000 mg | Freq: Once | INTRAMUSCULAR | Status: DC
  Filled 2013-10-02: qty 2

## 2013-10-02 MED ORDER — FUROSEMIDE 10 MG/ML IJ SOLN
60.0000 mg | Freq: Once | INTRAMUSCULAR | Status: AC
  Administered 2013-10-02: 60 mg via INTRAVENOUS

## 2013-10-02 MED ORDER — ONDANSETRON HCL 4 MG/2ML IJ SOLN
4.0000 mg | Freq: Once | INTRAMUSCULAR | Status: AC
  Administered 2013-10-02: 4 mg via INTRAVENOUS

## 2013-10-02 NOTE — Discharge Instructions (Signed)
Return to the hospital tomorrow at 8:45 for a 9:00 appointment to get a doppler ultrasound of your right lower leg to make sure you don't have a blood clot in your leg. Return to the ED if you get chest pain or shortness of breath. Call [facility] on Tuesday, May 26 at 8:30 am to get an appointment to see Dr Harrington Challenger on Tuesday for your depression. Return to the ED if you feel like you are going to do something to harm yourself or someone else.

## 2013-10-02 NOTE — ED Provider Notes (Addendum)
CSN: [id number]     Arrival date & time 10/02/13  1538 History   First MD Initiated Contact with Patient 10/02/13 1555     Chief Complaint  Patient presents with  . Migraine     (Consider location/radiation/quality/duration/timing/severity/associated sxs/prior Treatment) HPI  Patient reports she started getting depressed 2 days ago and also getting a migraine headache. She states she has had a lot of deaths in her life. She states her mother and father died 13 days apart 22 years ago. At that time she had to be admitted to the hospital for about 2 weeks because "I cried all the time". She states her sister died at childbirth and she adopted the baby who is now 76 years old. She states in June it will be one year since her husband committed suicide. She states he shot himself while they were in bed sleeping together. She states she was taking care of her brother who had cerebral palsy however he died last month. She states after her husband's death she was seeing a psychiatrist and going to group therapy however she quit after 3 or 4 months because she didn't think it was helping. She reports feeling depressed, she has loss of appetite, she has difficulty sleeping. She states she cries all the time. She states in the past year she has lost 171 pounds, down from 400 pounds to 208 pounds. She states she doesn't want to eat because her husband and her brother can't eat. She states she has recently realized that her brother and her husband are not coming back. She states after husband's death she did not leave her bedroom for 4 months. She states that she never left her husband alone for 25 years. She denies hearing any voices. She states she wishes she would hear voices of her husband or from [place]. She denies suicidal or homicidal ideation. She states "I don't want to go to hell and not be able to see my family or Jesus". She states she started getting a frontal headache and also top of her head achiness  about 2 days ago. She has had nausea and started having vomiting today. She states she used to have migraines like this however the last time was about 10 years ago. She also reports some chronic swelling of her legs, right worse than left. She states they normally "give me IV Lasix and it goes away". She denies chest pain or SOB.   PCP Dr Sherrie Sport  Past Medical History  Diagnosis Date  . Hypertension   . Diabetes mellitus without complication   . COPD (chronic obstructive pulmonary disease)   . CHF (congestive heart failure)     Diastolic  . Anxiety   . Chronic back pain   . Hypothyroidism   . Pedal edema   . On home O2     2.5 L N/C prn  . Degenerative disk disease    Past Surgical History  Procedure Laterality Date  . Tubal ligation    . Hernia repair     No family history on file. History  Substance Use Topics  . Smoking status: Current Every Day Smoker -- 0.25 packs/day    Types: Cigarettes  . Smokeless tobacco: Not on file  . Alcohol Use: No   Lives at home Lives alone Uses a walker  OB History   Grav Para Term Preterm Abortions TAB SAB Ect Mult Living  Review of Systems  All other systems reviewed and are negative.     Allergies  Dilaudid; Oxycontin; Reglan; Valium; and Vistaril  Home Medications   Prior to Admission medications   Medication Sig Start Date End Date Taking? Authorizing Provider  alprazolam Duanne Moron) 2 MG tablet Take 0.5-1 tablets (1-2 mg total) by mouth 4 (four) times daily as needed. Sometimes patient only takes 1mg , and she does not take 4 daily unless needed for anxiety 05/30/13  Yes Kinnie Feil, MD  aspirin EC 81 MG tablet Take 81 mg by mouth daily.   Yes Historical Provider, MD  diltiazem (CARDIZEM CD) 240 MG 24 hr capsule Take 1 capsule (240 mg total) by mouth daily. 05/30/13  Yes Kinnie Feil, MD  docusate sodium 100 MG CAPS Take 100 mg by mouth 2 (two) times daily. 05/30/13  Yes Kinnie Feil, MD  furosemide  (LASIX) 20 MG tablet Take 3 tablets (60 mg total) by mouth 2 (two) times daily. 05/30/13  Yes Kinnie Feil, MD  gabapentin (NEURONTIN) 600 MG tablet Take 1 tablet (600 mg total) by mouth 4 (four) times daily. 05/30/13  Yes Kinnie Feil, MD  glipiZIDE-metformin (METAGLIP) 5-500 MG per tablet Take 1 tablet by mouth 2 (two) times daily before a meal.   Yes Historical Provider, MD  insulin aspart (NOVOLOG) 100 UNIT/ML injection Inject 0-20 Units into the skin 3 (three) times daily with meals. 05/30/13  Yes Kinnie Feil, MD  insulin detemir (LEVEMIR) 100 UNIT/ML injection Inject 40 Units into the skin at bedtime.   Yes Historical Provider, MD  ipratropium-albuterol (DUONEB) 0.5-2.5 (3) MG/3ML SOLN Take 3 mLs by nebulization every 6 (six) hours. 05/30/13  Yes Kinnie Feil, MD  levothyroxine (SYNTHROID, LEVOTHROID) 125 MCG tablet Take 2 tablets (250 mcg total) by mouth daily before breakfast. 05/30/13  Yes Kinnie Feil, MD  nicotine (NICODERM CQ - DOSED IN MG/24 HOURS) 21 mg/24hr patch Place 1 patch (21 mg total) onto the skin daily. 05/30/13  Yes Kinnie Feil, MD  Omega-3 Fatty Acids (FISH OIL PO) Take 1 capsule by mouth daily.   Yes Historical Provider, MD  ondansetron (ZOFRAN) 4 MG tablet Take 4 mg by mouth 2 (two) times daily as needed. nausea 09/28/13  Yes Historical Provider, MD  oxyCODONE-acetaminophen (PERCOCET) 10-325 MG per tablet Take 1 tablet by mouth every 4 (four) hours as needed for pain.  06/30/13  Yes Historical Provider, MD  pantoprazole (PROTONIX) 40 MG tablet Take 1 tablet (40 mg total) by mouth 2 (two) times daily. 04/21/13  Yes Lezlie Octave Black, NP  potassium chloride (K-DUR) 10 MEQ tablet Take 20 mEq by mouth daily.    Yes Historical Provider, MD  tiZANidine (ZANAFLEX) 4 MG tablet Take 4 mg by mouth daily. 08/29/13  Yes Historical Provider, MD  albuterol (PROAIR HFA) 108 (90 BASE) MCG/ACT inhaler Inhale 1 puff into the lungs every 4 (four) hours as needed. For shortness of  breath 05/30/13   Kinnie Feil, MD  meclizine (ANTIVERT) 25 MG tablet Take 25 mg by mouth 2 (two) times daily. 04/06/13   Historical Provider, MD   BP 110/47  Pulse 79  Temp(Src) 97.5 F (36.4 C) (Oral)  Resp 20  Ht 5\' 6"  (1.676 m)  Wt 211 lb (95.709 kg)  BMI 34.07 kg/m2  SpO2 99%  Vital signs normal   Physical Exam  Nursing note and vitals reviewed. Constitutional: She is oriented to person, place, and time. She appears well-developed  and well-nourished.  Non-toxic appearance. She does not appear ill. No distress.  HENT:  Head: Normocephalic and atraumatic.  Right Ear: External ear normal.  Left Ear: External ear normal.  Nose: Nose normal. No mucosal edema or rhinorrhea.  Mouth/Throat: Oropharynx is clear and moist and mucous membranes are normal. No dental abscesses or uvula swelling.  Eyes: Conjunctivae and EOM are normal. Pupils are equal, round, and reactive to light.  Neck: Normal range of motion and full passive range of motion without pain. Neck supple.  Cardiovascular: Normal rate, regular rhythm and normal heart sounds.  Exam reveals no gallop and no friction rub.   No murmur heard. Pulmonary/Chest: Effort normal and breath sounds normal. No respiratory distress. She has no wheezes. She has no rhonchi. She has no rales. She exhibits no tenderness and no crepitus.  Abdominal: Soft. Normal appearance and bowel sounds are normal. She exhibits no distension. There is no tenderness. There is no rebound and no guarding.  Musculoskeletal: Normal range of motion. She exhibits no edema and no tenderness.  Moves all extremities well. Patient has swelling of her lower extremities. Right leg is more swollen than the left. It is not red or warm to touch. She states normally she gets Lasix and that swelling goes away.  Neurological: She is alert and oriented to person, place, and time. She has normal strength. No cranial nerve deficit.  Skin: Skin is warm, dry and intact. No rash  noted. No erythema. No pallor.  Psychiatric: Her speech is normal. She is slowed. She exhibits a depressed mood.  Tearful, crying    ED Course  Procedures (including critical care time)  Medications  cephALEXin (KEFLEX) capsule 500 mg (not administered)  enoxaparin (LOVENOX) injection 100 mg (not administered)  furosemide (LASIX) injection 60 mg (60 mg Intravenous Given 10/02/13 1718)  diphenhydrAMINE (BENADRYL) injection 25 mg (25 mg Intravenous Given 10/02/13 1717)  ondansetron (ZOFRAN) injection 4 mg (4 mg Intravenous Given 10/02/13 1717)  valproate (DEPACON) 1,000 mg in dextrose 5 % 50 mL IVPB (1,000 mg Intravenous New Bag/Given 10/02/13 2053)  magnesium sulfate IVPB 2 g 50 mL (0 g Intravenous Stopped 10/02/13 2004)  LORazepam (ATIVAN) injection 1 mg (1 mg Intravenous Given 10/02/13 1947)   19:45 pt continues to c/o her head hurting. PT started on keflex for possible UTI. She was given IV lasix for the swelling in her right leg.   19:50 Ford, TSS called to get reason for consult.   20:40 Ford, TSS, states they offered patient inpatient treatment, however, she refuses to leave [facility]. She is not having SI or HI so is not eligible for IVC. He wants her to follow up with Dr Harrington Challenger at the [facility] in [place] on Tuesday, May 26th, she should call to get an appointment.   21:00 pt eating in NAD, states her headache is better. We discussed her RLE which is about double the size of the left. She is willing to get lovenox and return in the morning to get an outpatient doppler US done.   Labs Review Results for orders placed during the hospital encounter of 10/02/13  CBC WITH DIFFERENTIAL      Result Value Ref Range   WBC 8.6  4.0 - 10.5 K/uL   RBC 4.34  3.87 - 5.11 MIL/uL   Hemoglobin 11.4 (*) 12.0 - 15.0 g/dL   HCT 36.0  36.0 - 46.0 %   MCV 82.9  78.0 - 100.0 fL   MCH 26.3  26.0 - 34.0  pg   MCHC 31.7  30.0 - 36.0 g/dL   RDW 16.3 (*) 11.5 - 15.5 %   Platelets 260  150 - 400 K/uL    Neutrophils Relative % 49  43 - 77 %   Neutro Abs 4.3  1.7 - 7.7 K/uL   Lymphocytes Relative 39  12 - 46 %   Lymphs Abs 3.4  0.7 - 4.0 K/uL   Monocytes Relative 8  3 - 12 %   Monocytes Absolute 0.7  0.1 - 1.0 K/uL   Eosinophils Relative 3  0 - 5 %   Eosinophils Absolute 0.3  0.0 - 0.7 K/uL   Basophils Relative 1  0 - 1 %   Basophils Absolute 0.1  0.0 - 0.1 K/uL  COMPREHENSIVE METABOLIC PANEL      Result Value Ref Range   Sodium 140  137 - 147 mEq/L   Potassium 3.6 (*) 3.7 - 5.3 mEq/L   Chloride 97  96 - 112 mEq/L   CO2 30  19 - 32 mEq/L   Glucose, Bld 89  70 - 99 mg/dL   BUN 29 (*) 6 - 23 mg/dL   Creatinine, Ser 1.25 (*) 0.50 - 1.10 mg/dL   Calcium 9.3  8.4 - 10.5 mg/dL   Total Protein 6.5  6.0 - 8.3 g/dL   Albumin 3.2 (*) 3.5 - 5.2 g/dL   AST 14  0 - 37 U/L   ALT 8  0 - 35 U/L   Alkaline Phosphatase 92  39 - 117 U/L   Total Bilirubin 0.2 (*) 0.3 - 1.2 mg/dL   GFR calc non Af Amer 45 (*) >90 mL/min   GFR calc Af Amer 52 (*) >90 mL/min  ETHANOL      Result Value Ref Range   Alcohol, Ethyl (B) <11  0 - 11 mg/dL  TROPONIN I      Result Value Ref Range   Troponin I <0.30  <0.30 ng/mL  D-DIMER, QUANTITATIVE      Result Value Ref Range   D-Dimer, Quant <0.27  0.00 - 0.48 ug/mL-FEU  URINALYSIS, ROUTINE W REFLEX MICROSCOPIC      Result Value Ref Range   Color, Urine YELLOW  YELLOW   APPearance HAZY (*) CLEAR   Specific Gravity, Urine 1.010  1.005 - 1.030   pH 6.0  5.0 - 8.0   Glucose, UA NEGATIVE  NEGATIVE mg/dL   Hgb urine dipstick SMALL (*) NEGATIVE   Bilirubin Urine NEGATIVE  NEGATIVE   Ketones, ur NEGATIVE  NEGATIVE mg/dL   Protein, ur NEGATIVE  NEGATIVE mg/dL   Urobilinogen, UA 0.2  0.0 - 1.0 mg/dL   Nitrite NEGATIVE  NEGATIVE   Leukocytes, UA SMALL (*) NEGATIVE  URINE RAPID DRUG SCREEN (HOSP PERFORMED)      Result Value Ref Range   Opiates NONE DETECTED  NONE DETECTED   Cocaine NONE DETECTED  NONE DETECTED   Benzodiazepines POSITIVE (*) NONE DETECTED    Amphetamines NONE DETECTED  NONE DETECTED   Tetrahydrocannabinol NONE DETECTED  NONE DETECTED   Barbiturates NONE DETECTED  NONE DETECTED  URINE MICROSCOPIC-ADD ON      Result Value Ref Range   Squamous Epithelial / LPF FEW (*) RARE   WBC, UA 11-20  <3 WBC/hpf   RBC / HPF 0-2  <3 RBC/hpf   Bacteria, UA FEW (*) RARE  CBG MONITORING, ED      Result Value Ref Range   Glucose-Capillary 64 (*) 70 - 99 mg/dL  Laboratory interpretation all normal except poss uti, renal insuffic     Imaging Review Dg Chest 2 View  10/02/2013   CLINICAL DATA:  Cough  EXAM: CHEST  2 VIEW  COMPARISON:  08/13/2013  FINDINGS: Heart, mediastinum hila are unremarkable. Lungs are clear. No pleural effusion. No pneumothorax.  Bony thorax is grossly intact.  IMPRESSION: No active cardiopulmonary disease.   Electronically Signed   By: Lajean Manes M.D.   On: 10/02/2013 17:28     EKG Interpretation   Date/Time:  Sunday Oct 02 2013 16:10:17 EDT Ventricular Rate:  76 PR Interval:  169 QRS Duration: 90 QT Interval:  408 QTC Calculation: 459 R Axis:   74 Text Interpretation:  Sinus rhythm Atrial premature complex Otherwise  within normal limits No significant change since last tracing 13 Aug 2013  Confirmed by [facility]  MD-I, Sakiyah Shur ([id number]) on 10/02/2013 7:21:16 PM      MDM   Final diagnoses:  Headache  Depression  Right leg swelling  UTI (urinary tract infection)    New Prescriptions   CEPHALEXIN (KEFLEX) 500 MG CAPSULE    Take 1 capsule (500 mg total) by mouth 3 (three) times daily.     Plan discharge  Rolland Porter, MD, Alanson Aly, MD 10/02/13 2141  Janice Norrie, MD 10/02/13 2158

## 2013-10-02 NOTE — ED Notes (Signed)
EKG completed at 1610. Second copy of EKG given to EDP.

## 2013-10-02 NOTE — ED Notes (Signed)
Pt. Reports increased depression. Pt. Reports that it is coming up on the anniversary of the death of her husband and son. Pt. Reports "My husband committed suicide in bed next to me." Pt. Reports hx of loss of parents and disabled brother. Pt. States "I just want to get set up with a psychiatrist." Pt. Denies suicidal ideation. Pt. Denies  Ever having feelings of wanting herself. Pt. States "I would never commit suicide, I don't want to go to hell, I want to see my family again." Pt. Denies homicidal ideation.

## 2013-10-02 NOTE — ED Notes (Signed)
Pt oxygen saturation on room air 95%. Pt reports wears 2liters when at home. Pt placed on 2liters for comfort. nad noted.

## 2013-10-02 NOTE — BH Assessment (Signed)
Assessment complete. Discussed inpatient psychiatric crisis stabilization with Pt and she said she would not be willing to sign herself into [facility] because she doesn't want to travel to [place] and would rather be served in [place]. Explained there are no inpatient behavioral health services in [place] but she did not want to go anywhere else. Discussed case with Serena Colonel, NP who agreed Pt does not meet criteria for involuntary commitment and agreed with plan for Pt to follow up with [facility] for outpatient treatment. Discussed recommendation with Dr. Rolland Porter who agreed with plan. Faxed referral letter with instructions to [facility] to be given to Pt.  Orpah Greek Rosana Hoes, [facility] Triage Specialist [phone]

## 2013-10-02 NOTE — BH Assessment (Signed)
Received call for assessment. Spoke to Dr. 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 She is crying, losing weight and having poor sleep. Tele-assessment will be initiated.  Orpah Greek Rosana Hoes, [facility] Triage Specialist [phone]

## 2013-10-02 NOTE — ED Notes (Signed)
Pt c/o migraine headache with vomiting, depression and chest tightness that started last night, states that she has "alot going on" and has been under a lot of stress lately.

## 2013-10-02 NOTE — BH Assessment (Signed)
Tele Assessment Note   Stephanie Combs is an 63 y.o. female, widowed, Caucasian who presented to [facility] accompanied by her daughter, Stephanie Combs [phone], who participated in the assessment with the Pt's permission. Pt reports she came to [facility] due to increasing symptoms of depression and migraine headaches. Pt states she has been depressed since her husband committed suicide on October 26, 2012 by shooting himself while they were sleeping in bed together. She also lost her brother, whom she was caring for due to his cerebral palsy, in 2013. She reports her depressive symptoms have been more severe over the past two weeks and she feels that as the anniversary of his death approaches "I am starting to finally accept they are gone and never coming back." She reports symptoms including persistent crying spells, poor sleep, excessive worry, social withdrawal, anhedonia, low self-esteem and feelings of sadness. Pt reports she has lost 170 lbs over the past year, from 400 to 208 lbs, because she didn't want to eat because her husband and brother could not eat. She denies any suicidal ideation or history of suicide attempts and insists she would never commit suicide because she believes she would go to hell and never be reunited with her loved ones. She denies homicidal ideation or any history of violence. She denies any auditory or visual hallucinations. She denies any alcohol or substance abuse and says she takes all her prescription medications as prescribed.  Pt lives alone in a trailer. She says her mother and father died 95 days apart 82 years ago. She says her sister died in childbirth 64 years ago and she raised the baby as her daughter. She says her daughter comes by regularly and she also has several neighbors who check in on her daily. Pt states she has seen a psychiatrist, therapist and has participated in group therapy in the past, saying "the people were nice but I didn't get a lot out of  it." She report one previous hospitalization many years ago at [facility] for depression.   Pt's daughter says she has noticed that her mother has been more depressed recently. She attributes her mother's mood to the anniversary of the death of Pt's husband. She has no concerns that her mother is suicidal or in any way a danger to herself or others.  Pt is disheveled, dressed in a hospital gown, alert, oriented x4 with normal speech and normal motor behavior. Eye contact is good and she is tearful at times. Pt's mood is depressed and sad and affect is congruent with mood. Thought process is coherent and relevant. There is no indication Pt is currently responding to internal stimuli or experiencing delusional thought content. Pt was cooperative during assessment. She says she wants an appointment to speak individually with a psychiatrist.  .   Axis I: 296.33 Major Depressive Disorder, Recurrent, Severe Axis II: Deferred Axis III:  Past Medical History  Diagnosis Date  . Hypertension   . Diabetes mellitus without complication   . COPD (chronic obstructive pulmonary disease)   . CHF (congestive heart failure)     Diastolic  . Anxiety   . Chronic back pain   . Hypothyroidism   . Pedal edema   . On home O2     2.5 L N/C prn  . Degenerative disk disease    Axis IV: Grief Axis V: GAF=40  Past Medical History:  Past Medical History  Diagnosis Date  . Hypertension   . Diabetes mellitus without complication   .  COPD (chronic obstructive pulmonary disease)   . CHF (congestive heart failure)     Diastolic  . Anxiety   . Chronic back pain   . Hypothyroidism   . Pedal edema   . On home O2     2.5 L N/C prn  . Degenerative disk disease     Past Surgical History  Procedure Laterality Date  . Tubal ligation    . Hernia repair      Family History: No family history on file.  Social History:  reports that she has been smoking Cigarettes.  She has been smoking about 0.25 packs per  day. She does not have any smokeless tobacco history on file. She reports that she does not drink alcohol or use illicit drugs.  Additional Social History:  Alcohol / Drug Use Pain Medications: Denies abuse - See MAR Prescriptions: Denies abuse - See MAR Over the Counter: Denies abuse History of alcohol / drug use?: No history of alcohol / drug abuse Longest period of sobriety (when/how long): NA  CIWA: CIWA-Ar BP: 109/60 mmHg Pulse Rate: 69 COWS:    Allergies:  Allergies  Allergen Reactions  . Dilaudid [Hydromorphone Hcl]   . Oxycontin [Oxycodone]   . Reglan [Metoclopramide]   . Valium [Diazepam]   . Vistaril [Hydroxyzine Hcl]     Home Medications:  (Not in a hospital admission)  OB/GYN Status:  No LMP recorded. Patient is postmenopausal.  General Assessment Data Location of Assessment: [facility] Is this a Tele or Face-to-Face Assessment?: Tele Assessment Is this an Initial Assessment or a Re-assessment for this encounter?: Initial Assessment Living Arrangements: Alone Can pt return to current living arrangement?: Yes Admission Status: Voluntary Is patient capable of signing voluntary admission?: Yes Transfer from: [facility] Referral Source: Self/Family/Friend     Learned Living Arrangements: Alone Name of Psychiatrist: None Name of Therapist: None  Education Status Is patient currently in school?: No Current Grade: NA Highest grade of school patient has completed: NA Name of school: NA Contact person: NA  Risk to self Suicidal Ideation: No Suicidal Intent: No Is patient at risk for suicide?: No Suicidal Plan?: No Access to Means: No What has been your use of drugs/alcohol within the last 12 months?: None Previous Attempts/Gestures: No How many times?: 0 Other Self Harm Risks: None Triggers for Past Attempts: None known Intentional Self Injurious Behavior: None Family Suicide History: Yes;See progress notes Recent stressful life  event(s): Loss (Comment);Recent negative physical changes (See note) Persecutory voices/beliefs?: No Depression: Yes Depression Symptoms: Despondent;Insomnia;Tearfulness;Isolating;Fatigue;Guilt;Loss of interest in usual pleasures;Feeling worthless/self pity Substance abuse history and/or treatment for substance abuse?: No Suicide prevention information given to non-admitted patients: Yes  Risk to Others Homicidal Ideation: No Thoughts of Harm to Others: No Current Homicidal Intent: No Current Homicidal Plan: No Access to Homicidal Means: No Identified Victim: None History of harm to others?: No Assessment of Violence: None Noted Violent Behavior Description: None Does patient have access to weapons?: No Criminal Charges Pending?: No Does patient have a court date: No  Psychosis Hallucinations: None noted Delusions: None noted  Mental Status Report Appear/Hygiene: In hospital gown;Disheveled Eye Contact: Good Motor Activity: Unremarkable Speech: Logical/coherent Level of Consciousness: Alert Mood: Depressed Affect: Depressed Anxiety Level: Minimal Thought Processes: Coherent;Relevant Judgement: Unimpaired Orientation: Person;Place;Time;Situation Obsessive Compulsive Thoughts/Behaviors: None  Cognitive Functioning Concentration: Normal Memory: Recent Intact;Remote Intact IQ: Average Insight: Good Impulse Control: Good Appetite: Poor Weight Loss: 170 (in one year) Weight Gain: 0 Sleep: Decreased Total Hours  of Sleep: 4 Vegetative Symptoms: Staying in bed  ADLScreening ([place]) Patient's cognitive ability adequate to safely complete daily activities?: Yes Patient able to express need for assistance with ADLs?: Yes Independently performs ADLs?: Yes (appropriate for developmental age)  Prior Inpatient Therapy Prior Inpatient Therapy: Yes Prior Therapy Dates: Approximately 30 years ago Prior Therapy Facilty/Provider(s): [facility] Reason  for Treatment: Depression  Prior Outpatient Therapy Prior Outpatient Therapy: Yes Prior Therapy Dates: 2014 Prior Therapy Facilty/Provider(s): [facility] Reason for Treatment: Grief  ADL Screening (condition at time of admission) Patient's cognitive ability adequate to safely complete daily activities?: Yes Is the patient deaf or have difficulty hearing?: No Does the patient have difficulty seeing, even when wearing glasses/contacts?: No Does the patient have difficulty concentrating, remembering, or making decisions?: No Patient able to express need for assistance with ADLs?: Yes Does the patient have difficulty dressing or bathing?: No Independently performs ADLs?: Yes (appropriate for developmental age) Does the patient have difficulty walking or climbing stairs?: No Weakness of Legs: Both Weakness of Arms/Hands: None  Home Assistive Devices/Equipment Home Assistive Devices/Equipment: CBG Meter;Walker (specify type)    Abuse/Neglect Assessment (Assessment to be complete while patient is alone) Physical Abuse: Denies Verbal Abuse: Denies Sexual Abuse: Denies Exploitation of patient/patient's resources: Denies Self-Neglect: Denies Values / Beliefs Cultural Requests During Hospitalization: None Spiritual Requests During Hospitalization: None   Advance Directives (For Healthcare) Advance Directive: Patient does not have advance directive;Patient would not like information Pre-existing out of facility DNR order (yellow form or pink MOST form): No Nutrition Screen- [facility] Adult/[facility] Patient's home diet: Carb modified  Additional Information 1:1 In Past 12 Months?: No CIRT Risk: No Elopement Risk: No Does patient have medical clearance?: Yes     Disposition: Discussed inpatient psychiatric crisis stabilization with Pt and she said she would not be willing to sign herself into [facility] because she doesn't want to travel to [place] and would rather be served in [place].  Explained there are no inpatient behavioral health services in [place] but she did not want to go anywhere else. Discussed case with Serena Colonel, NP who agreed Pt does not meet criteria for involuntary commitment and agreed with plan for Pt to follow up with [facility] for outpatient treatment. Discussed recommendation with Dr. Rolland Porter who agreed with plan. Pt contracts for safety. Faxed referral letter with instructions to [facility] to be given to Pt.   Disposition Initial Assessment Completed for this Encounter: Yes Disposition of Patient: Outpatient treatment Type of outpatient treatment: Adult ([facility])  Evelena Peat, [facility], [facility] Triage Specialist [phone]   Orpah Greek Anson Fret. 10/02/2013 9:11 PM

## 2013-10-02 NOTE — ED Notes (Signed)
TTS at bedside. 

## 2013-10-03 ENCOUNTER — Other Ambulatory Visit (HOSPITAL_COMMUNITY): Payer: Medicare Other

## 2013-10-06 LAB — URINE CULTURE: Colony Count: >=100000

## 2013-10-08 ENCOUNTER — Telehealth (HOSPITAL_BASED_OUTPATIENT_CLINIC_OR_DEPARTMENT_OTHER): Payer: Self-pay | Admitting: Emergency Medicine

## 2013-10-08 NOTE — Telephone Encounter (Signed)
Post ED Visit - Positive Culture Follow-up  Culture report reviewed by antimicrobial stewardship pharmacist: []  Wes Hilliard, Pharm.D., BCPS []  Heide Guile, Pharm.D., BCPS [x]  Alycia Rossetti, Pharm.D., BCPS []  [place], Pharm.D., BCPS, AAHIVP []  Legrand Como, Pharm.D., BCPS, AAHIVP []  Juliene Pina, Pharm.D.  Positive urine culture Treated with Keflex, organism sensitive to the same and no further patient follow-up is required at this time.  Chanta Bauers 10/08/2013, 6:09 PM

## 2013-12-04 ENCOUNTER — Emergency Department (HOSPITAL_COMMUNITY): Payer: Medicare Other

## 2013-12-04 ENCOUNTER — Emergency Department (HOSPITAL_COMMUNITY)
Admission: EM | Admit: 2013-12-04 | Discharge: 2013-12-04 | Disposition: A | Discharge status: Home / Self Care | Payer: Medicare Other | Attending: Emergency Medicine | Admitting: Emergency Medicine

## 2013-12-04 ENCOUNTER — Encounter (HOSPITAL_COMMUNITY): Payer: Self-pay | Admitting: Emergency Medicine

## 2013-12-04 DIAGNOSIS — R51 Headache: Secondary | ICD-10-CM | POA: Insufficient documentation

## 2013-12-04 DIAGNOSIS — G8929 Other chronic pain: Secondary | ICD-10-CM | POA: Insufficient documentation

## 2013-12-04 DIAGNOSIS — Z794 Long term (current) use of insulin: Secondary | ICD-10-CM | POA: Insufficient documentation

## 2013-12-04 DIAGNOSIS — IMO0002 Reserved for concepts with insufficient information to code with codable children: Secondary | ICD-10-CM | POA: Insufficient documentation

## 2013-12-04 DIAGNOSIS — Z79899 Other long term (current) drug therapy: Secondary | ICD-10-CM | POA: Diagnosis not present

## 2013-12-04 DIAGNOSIS — R739 Hyperglycemia, unspecified: Secondary | ICD-10-CM

## 2013-12-04 DIAGNOSIS — G43009 Migraine without aura, not intractable, without status migrainosus: Secondary | ICD-10-CM

## 2013-12-04 DIAGNOSIS — Z9981 Dependence on supplemental oxygen: Secondary | ICD-10-CM | POA: Insufficient documentation

## 2013-12-04 DIAGNOSIS — F17200 Nicotine dependence, unspecified, uncomplicated: Secondary | ICD-10-CM | POA: Insufficient documentation

## 2013-12-04 DIAGNOSIS — E039 Hypothyroidism, unspecified: Secondary | ICD-10-CM | POA: Insufficient documentation

## 2013-12-04 DIAGNOSIS — F411 Generalized anxiety disorder: Secondary | ICD-10-CM | POA: Insufficient documentation

## 2013-12-04 DIAGNOSIS — E119 Type 2 diabetes mellitus without complications: Secondary | ICD-10-CM | POA: Insufficient documentation

## 2013-12-04 DIAGNOSIS — I5032 Chronic diastolic (congestive) heart failure: Secondary | ICD-10-CM | POA: Insufficient documentation

## 2013-12-04 DIAGNOSIS — Z792 Long term (current) use of antibiotics: Secondary | ICD-10-CM | POA: Insufficient documentation

## 2013-12-04 DIAGNOSIS — J441 Chronic obstructive pulmonary disease with (acute) exacerbation: Secondary | ICD-10-CM | POA: Diagnosis not present

## 2013-12-04 DIAGNOSIS — Z7982 Long term (current) use of aspirin: Secondary | ICD-10-CM | POA: Insufficient documentation

## 2013-12-04 DIAGNOSIS — I10 Essential (primary) hypertension: Secondary | ICD-10-CM | POA: Insufficient documentation

## 2013-12-04 DIAGNOSIS — Z8739 Personal history of other diseases of the musculoskeletal system and connective tissue: Secondary | ICD-10-CM | POA: Diagnosis not present

## 2013-12-04 LAB — CBC WITH DIFFERENTIAL/PLATELET
Basophils Absolute: 0.1 10*3/uL (ref 0.0–0.1)
Basophils Relative: 1 % (ref 0–1)
Eosinophils Absolute: 0.1 10*3/uL (ref 0.0–0.7)
Eosinophils Relative: 1 % (ref 0–5)
HCT: 40.1 % (ref 36.0–46.0)
Hemoglobin: 13.3 g/dL (ref 12.0–15.0)
Lymphocytes Relative: 24 % (ref 12–46)
Lymphs Abs: 3.1 10*3/uL (ref 0.7–4.0)
MCH: 28.2 pg (ref 26.0–34.0)
MCHC: 33.2 g/dL (ref 30.0–36.0)
MCV: 85 fL (ref 78.0–100.0)
Monocytes Absolute: 0.7 10*3/uL (ref 0.1–1.0)
Monocytes Relative: 6 % (ref 3–12)
Neutro Abs: 8.8 10*3/uL — ABNORMAL HIGH (ref 1.7–7.7)
Neutrophils Relative %: 68 % (ref 43–77)
Platelets: 336 10*3/uL (ref 150–400)
RBC: 4.72 MIL/uL (ref 3.87–5.11)
RDW: 15 % (ref 11.5–15.5)
WBC: 12.8 10*3/uL — ABNORMAL HIGH (ref 4.0–10.5)

## 2013-12-04 LAB — COMPREHENSIVE METABOLIC PANEL
ALT: 8 U/L (ref 0–35)
AST: 12 U/L (ref 0–37)
Albumin: 3.6 g/dL (ref 3.5–5.2)
Alkaline Phosphatase: 109 U/L (ref 39–117)
Anion gap: 15 (ref 5–15)
BUN: 24 mg/dL — ABNORMAL HIGH (ref 6–23)
CO2: 24 mEq/L (ref 19–32)
Calcium: 9.4 mg/dL (ref 8.4–10.5)
Chloride: 96 mEq/L (ref 96–112)
Creatinine, Ser: 1.5 mg/dL — ABNORMAL HIGH (ref 0.50–1.10)
GFR calc Af Amer: 42 mL/min — ABNORMAL LOW (ref >90)
GFR calc non Af Amer: 36 mL/min — ABNORMAL LOW (ref >90)
Glucose, Bld: 323 mg/dL — ABNORMAL HIGH (ref 70–99)
Potassium: 4.1 mEq/L (ref 3.7–5.3)
Sodium: 135 mEq/L — ABNORMAL LOW (ref 137–147)
Total Bilirubin: 0.3 mg/dL (ref 0.3–1.2)
Total Protein: 7 g/dL (ref 6.0–8.3)

## 2013-12-04 LAB — TROPONIN I: Troponin I: <0.3 ng/mL (ref <0.30)

## 2013-12-04 LAB — PRO B NATRIURETIC PEPTIDE: Pro B Natriuretic peptide (BNP): 273.5 pg/mL — ABNORMAL HIGH (ref 0–125)

## 2013-12-04 MED ORDER — PREDNISONE 50 MG PO TABS
60.0000 mg | ORAL_TABLET | Freq: Once | ORAL | Status: AC
  Administered 2013-12-04: 60 mg via ORAL
  Filled 2013-12-04 (×2): qty 1

## 2013-12-04 MED ORDER — DIPHENHYDRAMINE HCL 50 MG/ML IJ SOLN
25.0000 mg | Freq: Once | INTRAMUSCULAR | Status: AC
  Administered 2013-12-04: 25 mg via INTRAVENOUS
  Filled 2013-12-04: qty 1

## 2013-12-04 MED ORDER — DOXYCYCLINE HYCLATE 100 MG PO CAPS: 100.0000 mg | ORAL_CAPSULE | Freq: Two times a day (BID) | ORAL | Status: DC

## 2013-12-04 MED ORDER — PROCHLORPERAZINE EDISYLATE 5 MG/ML IJ SOLN
10.0000 mg | Freq: Once | INTRAMUSCULAR | Status: AC
  Administered 2013-12-04: 10 mg via INTRAVENOUS
  Filled 2013-12-04: qty 2

## 2013-12-04 MED ORDER — IPRATROPIUM-ALBUTEROL 0.5-2.5 (3) MG/3ML IN SOLN
3.0000 mL | Freq: Once | RESPIRATORY_TRACT | Status: AC
  Administered 2013-12-04: 3 mL via RESPIRATORY_TRACT
  Filled 2013-12-04: qty 3

## 2013-12-04 MED ORDER — AMMONIA AROMATIC IN INHA
RESPIRATORY_TRACT | Status: AC
  Filled 2013-12-04: qty 10

## 2013-12-04 MED ORDER — PREDNISONE 20 MG PO TABS: 60.0000 mg | ORAL_TABLET | Freq: Once | ORAL | Status: DC

## 2013-12-04 NOTE — Discharge Instructions (Signed)
You were evaluated today for multiple complaints.  YOu appear to be having a COPD exacerbation.  You were noted to have high blood sugar without evidence of DKA.  YOu will be discharged on prednisone and you need to monitor your blood sugars closely while taking steroids.  Migraine Headache A migraine headache is an intense, throbbing pain on one or both sides of your head. A migraine can last for 30 minutes to several hours. CAUSES  The exact cause of a migraine headache is not always known. However, a migraine may be caused when nerves in the brain become irritated and release chemicals that cause inflammation. This causes pain. Certain things may also trigger migraines, such as:  Alcohol.  Smoking.  Stress.  Menstruation.  Aged cheeses.  Foods or drinks that contain nitrates, glutamate, aspartame, or tyramine.  Lack of sleep.  Chocolate.  Caffeine.  Hunger.  Physical exertion.  Fatigue.  Medicines used to treat chest pain (nitroglycerine), birth control pills, estrogen, and some blood pressure medicines. SIGNS AND SYMPTOMS  Pain on one or both sides of your head.  Pulsating or throbbing pain.  Severe pain that prevents daily activities.  Pain that is aggravated by any physical activity.  Nausea, vomiting, or both.  Dizziness.  Pain with exposure to bright lights, loud noises, or activity.  General sensitivity to bright lights, loud noises, or smells. Before you get a migraine, you may get warning signs that a migraine is coming (aura). An aura may include:  Seeing flashing lights.  Seeing bright spots, halos, or zigzag lines.  Having tunnel vision or blurred vision.  Having feelings of numbness or tingling.  Having trouble talking.  Having muscle weakness. DIAGNOSIS  A migraine headache is often diagnosed based on:  Symptoms.  Physical exam.  A CT scan or MRI of your head. These imaging tests cannot diagnose migraines, but they can help rule  out other causes of headaches. TREATMENT Medicines may be given for pain and nausea. Medicines can also be given to help prevent recurrent migraines.  HOME CARE INSTRUCTIONS  Only take over-the-counter or prescription medicines for pain or discomfort as directed by your health care provider. The use of long-term narcotics is not recommended.  Lie down in a dark, quiet room when you have a migraine.  Keep a journal to find out what may trigger your migraine headaches. For example, write down:  What you eat and drink.  How much sleep you get.  Any change to your diet or medicines.  Limit alcohol consumption.  Quit smoking if you smoke.  Get 7-9 hours of sleep, or as recommended by your health care provider.  Limit stress.  Keep lights dim if bright lights bother you and make your migraines worse. SEEK IMMEDIATE MEDICAL CARE IF:   Your migraine becomes severe.  You have a fever.  You have a stiff neck.  You have vision loss.  You have muscular weakness or loss of muscle control.  You start losing your balance or have trouble walking.  You feel faint or pass out.  You have severe symptoms that are different from your first symptoms. MAKE SURE YOU:   Understand these instructions.  Will watch your condition.  Will get help right away if you are not doing well or get worse. Document Released: 04/28/2005 Document Revised: 09/12/2013 Document Reviewed: 01/03/2013 [facility] Patient Information 2015 [place], [place]. This information is not intended to replace advice given to you by your health care provider. Make sure you discuss  any questions you have with your health care provider. Hyperglycemia Hyperglycemia occurs when the glucose (sugar) in your blood is too high. Hyperglycemia can happen for many reasons, but it most often happens to people who do not know they have diabetes or are not managing their diabetes properly.  CAUSES  Whether you have diabetes or not, there  are other causes of hyperglycemia. Hyperglycemia can occur when you have diabetes, but it can also occur in other situations that you might not be as aware of, such as: Diabetes  If you have diabetes and are having problems controlling your blood glucose, hyperglycemia could occur because of some of the following reasons:  Not following your meal plan.  Not taking your diabetes medications or not taking it properly.  Exercising less or doing less activity than you normally do.  Being sick. Pre-diabetes  This cannot be ignored. Before people develop Type 2 diabetes, they almost always have "pre-diabetes." This is when your blood glucose levels are higher than normal, but not yet high enough to be diagnosed as diabetes. Research has shown that some long-term damage to the body, especially the heart and circulatory system, may already be occurring during pre-diabetes. If you take action to manage your blood glucose when you have pre-diabetes, you may delay or prevent Type 2 diabetes from developing. Stress  If you have diabetes, you may be "diet" controlled or on oral medications or insulin to control your diabetes. However, you may find that your blood glucose is higher than usual in the hospital whether you have diabetes or not. This is often referred to as "stress hyperglycemia." Stress can elevate your blood glucose. This happens because of hormones put out by the body during times of stress. If stress has been the cause of your high blood glucose, it can be followed regularly by your caregiver. That way he/she can make sure your hyperglycemia does not continue to get worse or progress to diabetes. Steroids  Steroids are medications that act on the infection fighting system (immune system) to block inflammation or infection. One side effect can be a rise in blood glucose. Most people can produce enough extra insulin to allow for this rise, but for those who cannot, steroids make blood glucose  levels go even higher. It is not unusual for steroid treatments to "uncover" diabetes that is developing. It is not always possible to determine if the hyperglycemia will go away after the steroids are stopped. A special blood test called an A1c is sometimes done to determine if your blood glucose was elevated before the steroids were started. SYMPTOMS  Thirsty.  Frequent urination.  Dry mouth.  Blurred vision.  Tired or fatigue.  Weakness.  Sleepy.  Tingling in feet or leg. DIAGNOSIS  Diagnosis is made by monitoring blood glucose in one or all of the following ways:  A1c test. This is a chemical found in your blood.  Fingerstick blood glucose monitoring.  Laboratory results. TREATMENT  First, knowing the cause of the hyperglycemia is important before the hyperglycemia can be treated. Treatment may include, but is not be limited to:  Education.  Change or adjustment in medications.  Change or adjustment in meal plan.  Treatment for an illness, infection, etc.  More frequent blood glucose monitoring.  Change in exercise plan.  Decreasing or stopping steroids.  Lifestyle changes. HOME CARE INSTRUCTIONS   Test your blood glucose as directed.  Exercise regularly. Your caregiver will give you instructions about exercise. Pre-diabetes or diabetes which comes  on with stress is helped by exercising.  Eat wholesome, balanced meals. Eat often and at regular, fixed times. Your caregiver or nutritionist will give you a meal plan to guide your sugar intake.  Being at an ideal weight is important. If needed, losing as little as 10 to 15 pounds may help improve blood glucose levels. SEEK MEDICAL CARE IF:   You have questions about medicine, activity, or diet.  You continue to have symptoms (problems such as increased thirst, urination, or weight gain). SEEK IMMEDIATE MEDICAL CARE IF:   You are vomiting or have diarrhea.  Your breath smells fruity.  You are breathing  faster or slower.  You are very sleepy or incoherent.  You have numbness, tingling, or pain in your feet or hands.  You have chest pain.  Your symptoms get worse even though you have been following your caregiver's orders.  If you have any other questions or concerns. Document Released: 10/22/2000 Document Revised: 07/21/2011 Document Reviewed: 08/25/2011 [facility] Patient Information 2015 [place], [place]. This information is not intended to replace advice given to you by your health care provider. Make sure you discuss any questions you have with your health care provider. Chronic Obstructive Pulmonary Disease Chronic obstructive pulmonary disease (COPD) is a common lung condition in which airflow from the lungs is limited. COPD is a general term that can be used to describe many different lung problems that limit airflow, including both chronic bronchitis and emphysema. If you have COPD, your lung function will probably never return to normal, but there are measures you can take to improve lung function and make yourself feel better.  CAUSES   Smoking (common).   Exposure to secondhand smoke.   Genetic problems.  Chronic inflammatory lung diseases or recurrent infections. SYMPTOMS   Shortness of breath, especially with physical activity.   Deep, persistent (chronic) cough with a large amount of thick mucus.   Wheezing.   Rapid breaths (tachypnea).   Gray or bluish discoloration (cyanosis) of the skin, especially in fingers, toes, or lips.   Fatigue.   Weight loss.   Frequent infections or episodes when breathing symptoms become much worse (exacerbations).   Chest tightness. DIAGNOSIS  Your health care provider will take a medical history and perform a physical examination to make the initial diagnosis. Additional tests for COPD may include:   Lung (pulmonary) function tests.  Chest X-ray.  CT scan.  Blood tests. TREATMENT  Treatment available to help  you feel better when you have COPD includes:   Inhaler and nebulizer medicines. These help manage the symptoms of COPD and make your breathing more comfortable.  Supplemental oxygen. Supplemental oxygen is only helpful if you have a low oxygen level in your blood.   Exercise and physical activity. These are beneficial for nearly all people with COPD. Some people may also benefit from a pulmonary rehabilitation program. HOME CARE INSTRUCTIONS   Take all medicines (inhaled or pills) as directed by your health care provider.  Avoid over-the-counter medicines or cough syrups that dry up your airway (such as antihistamines) and slow down the elimination of secretions unless instructed otherwise by your health care provider.   If you are a smoker, the most important thing that you can do is stop smoking. Continuing to smoke will cause further lung damage and breathing trouble. Ask your health care provider for help with quitting smoking. He or she can direct you to community resources or hospitals that provide support.  Avoid exposure to irritants such  as smoke, chemicals, and fumes that aggravate your breathing.  Use oxygen therapy and pulmonary rehabilitation if directed by your health care provider. If you require home oxygen therapy, ask your health care provider whether you should purchase a pulse oximeter to measure your oxygen level at home.   Avoid contact with individuals who have a contagious illness.  Avoid extreme temperature and humidity changes.  Eat healthy foods. Eating smaller, more frequent meals and resting before meals may help you maintain your strength.  Stay active, but balance activity with periods of rest. Exercise and physical activity will help you maintain your ability to do things you want to do.  Preventing infection and hospitalization is very important when you have COPD. Make sure to receive all the vaccines your health care provider recommends, especially the  pneumococcal and influenza vaccines. Ask your health care provider whether you need a pneumonia vaccine.  Learn and use relaxation techniques to manage stress.  Learn and use controlled breathing techniques as directed by your health care provider. Controlled breathing techniques include:   Pursed lip breathing. Start by breathing in (inhaling) through your nose for 1 second. Then, purse your lips as if you were going to whistle and breathe out (exhale) through the pursed lips for 2 seconds.   Diaphragmatic breathing. Start by putting one hand on your abdomen just above your waist. Inhale slowly through your nose. The hand on your abdomen should move out. Then purse your lips and exhale slowly. You should be able to feel the hand on your abdomen moving in as you exhale.   Learn and use controlled coughing to clear mucus from your lungs. Controlled coughing is a series of short, progressive coughs. The steps of controlled coughing are:  1. Lean your head slightly forward.  2. Breathe in deeply using diaphragmatic breathing.  3. Try to hold your breath for 3 seconds.  4. Keep your mouth slightly open while coughing twice.  5. Spit any mucus out into a tissue.  6. Rest and repeat the steps once or twice as needed. SEEK MEDICAL CARE IF:   You are coughing up more mucus than usual.   There is a change in the color or thickness of your mucus.   Your breathing is more labored than usual.   Your breathing is faster than usual.  SEEK IMMEDIATE MEDICAL CARE IF:   You have shortness of breath while you are resting.   You have shortness of breath that prevents you from:  Being able to talk.   Performing your usual physical activities.   You have chest pain lasting longer than 5 minutes.   Your skin color is more cyanotic than usual.  You measure low oxygen saturations for longer than 5 minutes with a pulse oximeter. MAKE SURE YOU:   Understand these  instructions.  Will watch your condition.  Will get help right away if you are not doing well or get worse. Document Released: 02/05/2005 Document Revised: 09/12/2013 Document Reviewed: 12/23/2012 [facility] Patient Information 2015 [place], [place]. This information is not intended to replace advice given to you by your health care provider. Make sure you discuss any questions you have with your health care provider.

## 2013-12-04 NOTE — ED Provider Notes (Signed)
CSN: [id number]     Arrival date & time 12/04/13  0044 History   First MD Initiated Contact with Patient 12/04/13 0058     Chief Complaint  Patient presents with  . Shortness of Breath  . Headache     (Consider location/radiation/quality/duration/timing/severity/associated sxs/prior Treatment) HPI  This is a 63 year old female who presents with multiple complaints. Patient has a history of diabetes, hypertension, COPD, CHF. Patient reports 2-3 days of worsening shortness of breath and chest tightness. She also reports wheezing. She's been using her nebulizers at home. Patient wears 2 L O2 at home when necessary. She denies a cough or fever. Patient states that this pain is constant and nonradiating. Currently is 4/10.  Patient also reports migraine. She states "I feel like my head is going to blow up."  She has a history of migraines and states that she is normally given morphine or Nubain for her migraines. She states that this headache feels similar. She denies acute onset of headache. Patient reports increasing stress at home and thinks this is the cause of her symptoms.  Past Medical History  Diagnosis Date  . Hypertension   . Diabetes mellitus without complication   . COPD (chronic obstructive pulmonary disease)   . CHF (congestive heart failure)     Diastolic  . Anxiety   . Chronic back pain   . Hypothyroidism   . Pedal edema   . On home O2     2.5 L N/C prn  . Degenerative disk disease    Past Surgical History  Procedure Laterality Date  . Tubal ligation    . Hernia repair     No family history on file. History  Substance Use Topics  . Smoking status: Current Every Day Smoker -- 0.25 packs/day    Types: Cigarettes  . Smokeless tobacco: Not on file  . Alcohol Use: No   OB History   Grav Para Term Preterm Abortions TAB SAB Ect Mult Living                 Review of Systems  Constitutional: Negative for fever.  Eyes: Negative for visual disturbance.  Respiratory:  Positive for chest tightness, shortness of breath and wheezing. Negative for cough.   Cardiovascular: Positive for chest pain and leg swelling.  Gastrointestinal: Negative for nausea, vomiting and abdominal pain.  Genitourinary: Negative for dysuria.  Musculoskeletal: Negative for back pain.  Neurological: Positive for headaches. Negative for dizziness, weakness and numbness.  Psychiatric/Behavioral: Negative for confusion.  All other systems reviewed and are negative.     Allergies  Dilaudid; Oxycontin; Reglan; Valium; and Vistaril  Home Medications   Prior to Admission medications   Medication Sig Start Date End Date Taking? Authorizing Provider  albuterol (PROAIR HFA) 108 (90 BASE) MCG/ACT inhaler Inhale 1 puff into the lungs every 4 (four) hours as needed. For shortness of breath 05/30/13  Yes Kinnie Feil, MD  alprazolam Duanne Moron) 2 MG tablet Take 2 mg by mouth 4 (four) times daily.   Yes Historical Provider, MD  aspirin EC 81 MG tablet Take 81 mg by mouth daily.   Yes Historical Provider, MD  diltiazem (CARDIZEM CD) 240 MG 24 hr capsule Take 1 capsule (240 mg total) by mouth daily. 05/30/13  Yes Kinnie Feil, MD  docusate sodium 100 MG CAPS Take 100 mg by mouth 2 (two) times daily. 05/30/13  Yes Kinnie Feil, MD  furosemide (LASIX) 20 MG tablet Take 3 tablets (60 mg total) by  mouth 2 (two) times daily. 05/30/13  Yes Kinnie Feil, MD  gabapentin (NEURONTIN) 600 MG tablet Take 1 tablet (600 mg total) by mouth 4 (four) times daily. 05/30/13  Yes Kinnie Feil, MD  insulin aspart (NOVOLOG) 100 UNIT/ML injection Inject 0-20 Units into the skin 3 (three) times daily with meals. 05/30/13  Yes Kinnie Feil, MD  insulin detemir (LEVEMIR) 100 UNIT/ML injection Inject 40 Units into the skin at bedtime.   Yes Historical Provider, MD  ipratropium-albuterol (DUONEB) 0.5-2.5 (3) MG/3ML SOLN Take 3 mLs by nebulization every 6 (six) hours. 05/30/13  Yes Kinnie Feil, MD   levothyroxine (SYNTHROID, LEVOTHROID) 125 MCG tablet Take 2 tablets (250 mcg total) by mouth daily before breakfast. 05/30/13  Yes Kinnie Feil, MD  meclizine (ANTIVERT) 25 MG tablet Take 25 mg by mouth 2 (two) times daily. 04/06/13  Yes Historical Provider, MD  Omega-3 Fatty Acids (FISH OIL PO) Take 1 capsule by mouth 2 (two) times daily.    Yes Historical Provider, MD  ondansetron (ZOFRAN) 4 MG tablet Take 4 mg by mouth 2 (two) times daily as needed. nausea 09/28/13  Yes Historical Provider, MD  oxyCODONE-acetaminophen (PERCOCET) 10-325 MG per tablet Take 1 tablet by mouth every 4 (four) hours as needed for pain.  06/30/13  Yes Historical Provider, MD  pantoprazole (PROTONIX) 40 MG tablet Take 1 tablet (40 mg total) by mouth 2 (two) times daily. 04/21/13  Yes Lezlie Octave Black, NP  potassium chloride (K-DUR) 10 MEQ tablet Take 20 mEq by mouth daily.    Yes Historical Provider, MD  cephALEXin (KEFLEX) 500 MG capsule Take 1 capsule (500 mg total) by mouth 3 (three) times daily. 10/02/13   Janice Norrie, MD  doxycycline (VIBRAMYCIN) 100 MG capsule Take 1 capsule (100 mg total) by mouth 2 (two) times daily. 12/04/13   Merryl Hacker, MD  glipiZIDE-metformin (METAGLIP) 5-500 MG per tablet Take 1 tablet by mouth 2 (two) times daily before a meal.    Historical Provider, MD  nicotine (NICODERM CQ - DOSED IN MG/24 HOURS) 21 mg/24hr patch Place 1 patch (21 mg total) onto the skin daily. 05/30/13   Kinnie Feil, MD  predniSONE (DELTASONE) 20 MG tablet Take 3 tablets (60 mg total) by mouth once. 12/04/13   Merryl Hacker, MD  tiZANidine (ZANAFLEX) 4 MG tablet Take 4 mg by mouth daily. 08/29/13   Historical Provider, MD   BP 112/45  Pulse 93  Temp(Src) 97.9 F (36.6 C) (Oral)  Resp 23  Ht 5\' 6"  (1.676 m)  Wt 220 lb (99.791 kg)  BMI 35.53 kg/m2  SpO2 91% Physical Exam  Nursing note and vitals reviewed. Constitutional: She is oriented to person, place, and time. No distress.  Chronically  ill-appearing, overweight  HENT:  Head: Normocephalic and atraumatic.  Neck: Neck supple.  Cardiovascular: Normal rate, regular rhythm and normal heart sounds.   Pulmonary/Chest: Effort normal. No respiratory distress. She has wheezes. She has no rales.  Abdominal: Soft. Bowel sounds are normal. There is no tenderness. There is no rebound.  Musculoskeletal: She exhibits edema.  2+ bilateral lower extremity edema  Neurological: She is alert and oriented to person, place, and time.  5 out of 5 strength in all 4 extremities, cranial nerves II through XII intact  Skin: Skin is warm and dry.  Psychiatric: She has a normal mood and affect.    ED Course  Procedures (including critical care time) Labs Review Labs Reviewed  CBC  WITH DIFFERENTIAL - Abnormal; Notable for the following:    WBC 12.8 (*)    Neutro Abs 8.8 (*)    All other components within normal limits  COMPREHENSIVE METABOLIC PANEL - Abnormal; Notable for the following:    Sodium 135 (*)    Glucose, Bld 323 (*)    BUN 24 (*)    Creatinine, Ser 1.50 (*)    GFR calc non Af Amer 36 (*)    GFR calc Af Amer 42 (*)    All other components within normal limits  PRO B NATRIURETIC PEPTIDE - Abnormal; Notable for the following:    Pro B Natriuretic peptide (BNP) 273.5 (*)    All other components within normal limits  TROPONIN I    Imaging Review Dg Chest 2 View  12/04/2013   CLINICAL DATA:  SHORTNESS OF BREATH HEADACHE  EXAM: CHEST  2 VIEW  COMPARISON:  Prior radiograph from 10/02/2013  FINDINGS: The cardiac and mediastinal silhouettes are stable in size and contour, and remain within normal limits.  The lungs are hypoinflated. There is mild central perihilar vascular congestion without overt pulmonary edema. No definite focal infiltrate. No pneumothorax.  No acute osseous abnormality identified.  IMPRESSION: 1. Mild central perihilar vascular congestion without overt pulmonary edema. This finding may be in part related to shallow  lung inflation. 2. No other acute cardiopulmonary abnormality.   Electronically Signed   By: Jeannine Boga M.D.   On: 12/04/2013 02:31     EKG Interpretation   Date/Time:  Sunday December 04 2013 01:37:04 EDT Ventricular Rate:  86 PR Interval:  176 QRS Duration: 89 QT Interval:  389 QTC Calculation: 465 R Axis:   61 Text Interpretation:  Sinus rhythm No significant change since last  tracing Confirmed by HORTON  MD, Loma Sousa ([id number]) on 12/04/2013 1:40:44 AM      MDM   Final diagnoses:  COPD with acute exacerbation  Migraine without aura and without status migrainosus, not intractable  Hyperglycemia    Patient presents with multiple complaints including shortness of breath, chest pain, and headache. She is nontoxic on exam. Vital signs notable for a pulse ox of 91%. Patient is on her home 2 L of oxygen. She is wheezing on exam. Chest pain is related to shortness of breath.  Screening EKG reassuring. Chest x-ray shows mild perihilar congestion without pulmonary edema and no pneumonia. Basic labwork obtained.  Troponin negative.  BNP of 273. Patient has a mild leukocytosis. Creatinine is at baseline. She was noted to be hyperglycemic with a glucose of 323. Patient was given a duo neb and prednisone for presumed COPD exacerbation given exam. She was also given Compazine and Benadryl for her headache. She's been resting comfortably and sleeping while in the emergency room. On repeat exam, wheezing has improved. The patient symptoms are likely secondary to acute COPD exacerbation.  Regarding patient's headache I have low suspicion for subarachnoid or other process and this is likely consistent with patient's prior migraines. Will place patient on prednisone and doxycycline at discharge. Patient is to monitor blood sugars given noted hyperglycemia. Patient stated understanding.  After history, exam, and medical workup I feel the patient has been appropriately medically screened and is safe for  discharge home. Pertinent diagnoses were discussed with the patient. Patient was given return precautions.     Merryl Hacker, MD 12/04/13 [phone]

## 2013-12-04 NOTE — ED Notes (Signed)
Pt states she has had a headache and feels sob for several days, states she is also nauseated, having a lot of stress recently

## 2013-12-31 ENCOUNTER — Encounter (HOSPITAL_COMMUNITY): Payer: Self-pay | Admitting: Emergency Medicine

## 2013-12-31 ENCOUNTER — Emergency Department (HOSPITAL_COMMUNITY)
Admission: EM | Admit: 2013-12-31 | Discharge: 2013-12-31 | Disposition: A | Discharge status: Home / Self Care | Payer: Medicare Other | Attending: Emergency Medicine | Admitting: Emergency Medicine

## 2013-12-31 ENCOUNTER — Emergency Department (HOSPITAL_COMMUNITY): Payer: Medicare Other

## 2013-12-31 DIAGNOSIS — Z794 Long term (current) use of insulin: Secondary | ICD-10-CM | POA: Insufficient documentation

## 2013-12-31 DIAGNOSIS — E119 Type 2 diabetes mellitus without complications: Secondary | ICD-10-CM | POA: Diagnosis not present

## 2013-12-31 DIAGNOSIS — Z8739 Personal history of other diseases of the musculoskeletal system and connective tissue: Secondary | ICD-10-CM | POA: Insufficient documentation

## 2013-12-31 DIAGNOSIS — Z79899 Other long term (current) drug therapy: Secondary | ICD-10-CM | POA: Insufficient documentation

## 2013-12-31 DIAGNOSIS — Z7982 Long term (current) use of aspirin: Secondary | ICD-10-CM | POA: Diagnosis not present

## 2013-12-31 DIAGNOSIS — F17200 Nicotine dependence, unspecified, uncomplicated: Secondary | ICD-10-CM | POA: Insufficient documentation

## 2013-12-31 DIAGNOSIS — E039 Hypothyroidism, unspecified: Secondary | ICD-10-CM | POA: Insufficient documentation

## 2013-12-31 DIAGNOSIS — IMO0002 Reserved for concepts with insufficient information to code with codable children: Secondary | ICD-10-CM | POA: Diagnosis not present

## 2013-12-31 DIAGNOSIS — I509 Heart failure, unspecified: Secondary | ICD-10-CM | POA: Insufficient documentation

## 2013-12-31 DIAGNOSIS — Z9981 Dependence on supplemental oxygen: Secondary | ICD-10-CM | POA: Diagnosis not present

## 2013-12-31 DIAGNOSIS — F411 Generalized anxiety disorder: Secondary | ICD-10-CM | POA: Diagnosis not present

## 2013-12-31 DIAGNOSIS — R0602 Shortness of breath: Secondary | ICD-10-CM | POA: Insufficient documentation

## 2013-12-31 DIAGNOSIS — G8929 Other chronic pain: Secondary | ICD-10-CM | POA: Diagnosis not present

## 2013-12-31 DIAGNOSIS — J441 Chronic obstructive pulmonary disease with (acute) exacerbation: Secondary | ICD-10-CM | POA: Insufficient documentation

## 2013-12-31 DIAGNOSIS — I10 Essential (primary) hypertension: Secondary | ICD-10-CM | POA: Diagnosis not present

## 2013-12-31 DIAGNOSIS — M2550 Pain in unspecified joint: Secondary | ICD-10-CM

## 2013-12-31 LAB — CBC WITH DIFFERENTIAL/PLATELET
Basophils Absolute: 0.1 10*3/uL (ref 0.0–0.1)
Basophils Relative: 1 % (ref 0–1)
Eosinophils Absolute: 0.2 10*3/uL (ref 0.0–0.7)
Eosinophils Relative: 2 % (ref 0–5)
HCT: 36.1 % (ref 36.0–46.0)
Hemoglobin: 11.6 g/dL — ABNORMAL LOW (ref 12.0–15.0)
Lymphocytes Relative: 34 % (ref 12–46)
Lymphs Abs: 3.5 10*3/uL (ref 0.7–4.0)
MCH: 27.4 pg (ref 26.0–34.0)
MCHC: 32.1 g/dL (ref 30.0–36.0)
MCV: 85.1 fL (ref 78.0–100.0)
Monocytes Absolute: 0.7 10*3/uL (ref 0.1–1.0)
Monocytes Relative: 7 % (ref 3–12)
Neutro Abs: 5.7 10*3/uL (ref 1.7–7.7)
Neutrophils Relative %: 56 % (ref 43–77)
Platelets: 301 10*3/uL (ref 150–400)
RBC: 4.24 MIL/uL (ref 3.87–5.11)
RDW: 14.5 % (ref 11.5–15.5)
WBC: 10.3 10*3/uL (ref 4.0–10.5)

## 2013-12-31 LAB — COMPREHENSIVE METABOLIC PANEL
ALT: 8 U/L (ref 0–35)
AST: 14 U/L (ref 0–37)
Albumin: 3.4 g/dL — ABNORMAL LOW (ref 3.5–5.2)
Alkaline Phosphatase: 99 U/L (ref 39–117)
Anion gap: 12 (ref 5–15)
BUN: 33 mg/dL — ABNORMAL HIGH (ref 6–23)
CO2: 30 mEq/L (ref 19–32)
Calcium: 9.6 mg/dL (ref 8.4–10.5)
Chloride: 99 mEq/L (ref 96–112)
Creatinine, Ser: 1.4 mg/dL — ABNORMAL HIGH (ref 0.50–1.10)
GFR calc Af Amer: 45 mL/min — ABNORMAL LOW (ref >90)
GFR calc non Af Amer: 39 mL/min — ABNORMAL LOW (ref >90)
Glucose, Bld: 133 mg/dL — ABNORMAL HIGH (ref 70–99)
Potassium: 4.6 mEq/L (ref 3.7–5.3)
Sodium: 141 mEq/L (ref 137–147)
Total Bilirubin: <0.2 mg/dL — ABNORMAL LOW (ref 0.3–1.2)
Total Protein: 6.5 g/dL (ref 6.0–8.3)

## 2013-12-31 LAB — TROPONIN I: Troponin I: <0.3 ng/mL (ref <0.30)

## 2013-12-31 MED ORDER — IPRATROPIUM BROMIDE 0.02 % IN SOLN: 0.5000 mg | Freq: Once | RESPIRATORY_TRACT | Status: DC

## 2013-12-31 MED ORDER — ONDANSETRON 8 MG PO TBDP: 8.0000 mg | ORAL_TABLET | Freq: Three times a day (TID) | ORAL | Status: DC | PRN

## 2013-12-31 MED ORDER — IPRATROPIUM-ALBUTEROL 0.5-2.5 (3) MG/3ML IN SOLN
3.0000 mL | Freq: Once | RESPIRATORY_TRACT | Status: AC
  Administered 2013-12-31: 3 mL via RESPIRATORY_TRACT
  Filled 2013-12-31: qty 3

## 2013-12-31 MED ORDER — ALBUTEROL SULFATE (2.5 MG/3ML) 0.083% IN NEBU: 5.0000 mg | INHALATION_SOLUTION | Freq: Once | RESPIRATORY_TRACT | Status: DC

## 2013-12-31 MED ORDER — ALBUTEROL SULFATE HFA 108 (90 BASE) MCG/ACT IN AERS
2.0000 | INHALATION_SPRAY | RESPIRATORY_TRACT | Status: DC
  Administered 2013-12-31: 2 via RESPIRATORY_TRACT
  Filled 2013-12-31: qty 6.7

## 2013-12-31 MED ORDER — HYDROCODONE-ACETAMINOPHEN 5-325 MG PO TABS: 1.0000 | ORAL_TABLET | Freq: Four times a day (QID) | ORAL | Status: DC | PRN

## 2013-12-31 MED ORDER — OXYCODONE-ACETAMINOPHEN 5-325 MG PO TABS
1.0000 | ORAL_TABLET | Freq: Once | ORAL | Status: AC
  Administered 2013-12-31: 1 via ORAL
  Filled 2013-12-31: qty 1

## 2013-12-31 MED ORDER — PREDNISONE 10 MG PO TABS: 60.0000 mg | ORAL_TABLET | Freq: Every day | ORAL | Status: DC

## 2013-12-31 MED ORDER — ALBUTEROL SULFATE (2.5 MG/3ML) 0.083% IN NEBU
2.5000 mg | INHALATION_SOLUTION | Freq: Once | RESPIRATORY_TRACT | Status: AC
  Administered 2013-12-31: 2.5 mg via RESPIRATORY_TRACT
  Filled 2013-12-31: qty 3

## 2013-12-31 MED ORDER — BENZONATATE 100 MG PO CAPS: 100.0000 mg | ORAL_CAPSULE | Freq: Three times a day (TID) | ORAL | Status: DC | PRN

## 2013-12-31 NOTE — ED Provider Notes (Addendum)
CSN: [id number]     Arrival date & time 12/31/13  0551 History   First MD Initiated Contact with Patient 12/31/13 [phone]     Chief Complaint  Patient presents with  . Shortness of Breath  . Knee Pain     HPI Patient states she developed some increasing cough and shortness of breath this evening.  She went to her [place] dance and danced for approximately an hour and a half.  She said she was sweaty.  She states she previously had been having some coughing congestion and was placed on azithromycin by her doctor without improvement in her symptoms.  She tried her albuterol at home without improvement in her symptoms.  She continues to smoke cigarettes.  She has a history of COPD on home oxygen.  She states that when she has flares like this she prefers not to give steroids as it makes her blood sugar go up.  She also reports aching pain in her right knee, left shoulder, left elbow.  Pain is mild in severity.  Nothing worsens or improves her pain.  No fevers or chills.  Denies nausea vomiting or diarrhea.   Past Medical History  Diagnosis Date  . Hypertension   . Diabetes mellitus without complication   . COPD (chronic obstructive pulmonary disease)   . CHF (congestive heart failure)     Diastolic  . Anxiety   . Chronic back pain   . Hypothyroidism   . Pedal edema   . On home O2     2.5 L N/C prn  . Degenerative disk disease    Past Surgical History  Procedure Laterality Date  . Tubal ligation    . Hernia repair     No family history on file. History  Substance Use Topics  . Smoking status: Current Every Day Smoker -- 0.25 packs/day    Types: Cigarettes  . Smokeless tobacco: Not on file  . Alcohol Use: No   OB History   Grav Para Term Preterm Abortions TAB SAB Ect Mult Living                 Review of Systems  All other systems reviewed and are negative.     Allergies  Dilaudid; Oxycontin; Reglan; Valium; and Vistaril  Home Medications   Prior to Admission  medications   Medication Sig Start Date End Date Taking? Authorizing Provider  albuterol (PROAIR HFA) 108 (90 BASE) MCG/ACT inhaler Inhale 1 puff into the lungs every 4 (four) hours as needed. For shortness of breath 05/30/13  Yes Kinnie Feil, MD  alprazolam Duanne Moron) 2 MG tablet Take 2 mg by mouth 4 (four) times daily.   Yes Historical Provider, MD  aspirin EC 81 MG tablet Take 81 mg by mouth daily.   Yes Historical Provider, MD  benzonatate (TESSALON) 200 MG capsule Take 200 mg by mouth 2 (two) times daily as needed for cough.   Yes Historical Provider, MD  diltiazem (CARDIZEM CD) 240 MG 24 hr capsule Take 1 capsule (240 mg total) by mouth daily. 05/30/13  Yes Kinnie Feil, MD  docusate sodium 100 MG CAPS Take 100 mg by mouth 2 (two) times daily. 05/30/13  Yes Kinnie Feil, MD  furosemide (LASIX) 20 MG tablet Take 3 tablets (60 mg total) by mouth 2 (two) times daily. 05/30/13  Yes Kinnie Feil, MD  gabapentin (NEURONTIN) 600 MG tablet Take 1 tablet (600 mg total) by mouth 4 (four) times daily. 05/30/13  Yes Alicia Amel  Wynonia Lawman, MD  glipiZIDE-metformin (METAGLIP) 5-500 MG per tablet Take 1 tablet by mouth 2 (two) times daily before a meal.   Yes Historical Provider, MD  insulin aspart (NOVOLOG) 100 UNIT/ML injection Inject 0-20 Units into the skin 3 (three) times daily with meals. 05/30/13  Yes Kinnie Feil, MD  insulin detemir (LEVEMIR) 100 UNIT/ML injection Inject 40 Units into the skin at bedtime.   Yes Historical Provider, MD  ipratropium-albuterol (DUONEB) 0.5-2.5 (3) MG/3ML SOLN Take 3 mLs by nebulization every 6 (six) hours. 05/30/13  Yes Kinnie Feil, MD  levothyroxine (SYNTHROID, LEVOTHROID) 125 MCG tablet Take 2 tablets (250 mcg total) by mouth daily before breakfast. 05/30/13  Yes Kinnie Feil, MD  meclizine (ANTIVERT) 25 MG tablet Take 25 mg by mouth 2 (two) times daily. 04/06/13  Yes Historical Provider, MD  nicotine (NICODERM CQ - DOSED IN MG/24 HOURS) 21 mg/24hr patch  Place 1 patch (21 mg total) onto the skin daily. 05/30/13  Yes Kinnie Feil, MD  Omega-3 Fatty Acids (FISH OIL PO) Take 1 capsule by mouth 2 (two) times daily.    Yes Historical Provider, MD  ondansetron (ZOFRAN) 4 MG tablet Take 4 mg by mouth 2 (two) times daily as needed. nausea 09/28/13  Yes Historical Provider, MD  oxyCODONE-acetaminophen (PERCOCET) 10-325 MG per tablet Take 1 tablet by mouth every 4 (four) hours as needed for pain.  06/30/13  Yes Historical Provider, MD  pantoprazole (PROTONIX) 40 MG tablet Take 1 tablet (40 mg total) by mouth 2 (two) times daily. 04/21/13  Yes Lezlie Octave Black, NP  potassium chloride (K-DUR) 10 MEQ tablet Take 20 mEq by mouth daily.    Yes Historical Provider, MD  tiZANidine (ZANAFLEX) 4 MG tablet Take 4 mg by mouth daily. 08/29/13  Yes Historical Provider, MD  cephALEXin (KEFLEX) 500 MG capsule Take 1 capsule (500 mg total) by mouth 3 (three) times daily. 10/02/13   Janice Norrie, MD  doxycycline (VIBRAMYCIN) 100 MG capsule Take 1 capsule (100 mg total) by mouth 2 (two) times daily. 12/04/13   Merryl Hacker, MD  predniSONE (DELTASONE) 20 MG tablet Take 3 tablets (60 mg total) by mouth once. 12/04/13   Merryl Hacker, MD   BP 130/86  Pulse 94  Temp(Src) 98.1 F (36.7 C) (Oral)  Resp 24  Ht 5\' 6"  (1.676 m)  Wt 199 lb (90.266 kg)  BMI 32.13 kg/m2  SpO2 100% Physical Exam  Nursing note and vitals reviewed. Constitutional: She is oriented to person, place, and time. She appears well-developed and well-nourished. No distress.  HENT:  Head: Normocephalic and atraumatic.  Eyes: EOM are normal.  Neck: Normal range of motion.  Cardiovascular: Normal rate, regular rhythm and normal heart sounds.   Pulmonary/Chest: Effort normal. She has wheezes.  Abdominal: Soft. She exhibits no distension. There is no tenderness.  Musculoskeletal: Normal range of motion.  Neurological: She is alert and oriented to person, place, and time.  Skin: Skin is warm and dry.   Psychiatric: She has a normal mood and affect. Judgment normal.    ED Course  Procedures (including critical care time) Labs Review Labs Reviewed  CBC WITH DIFFERENTIAL - Abnormal; Notable for the following:    Hemoglobin 11.6 (*)    All other components within normal limits  COMPREHENSIVE METABOLIC PANEL - Abnormal; Notable for the following:    Glucose, Bld 133 (*)    BUN 33 (*)    Creatinine, Ser 1.40 (*)  Albumin 3.4 (*)    Total Bilirubin <0.2 (*)    GFR calc non Af Amer 39 (*)    GFR calc Af Amer 45 (*)    All other components within normal limits  TROPONIN I    Imaging Review Dg Chest Port 1 View  12/31/2013   CLINICAL DATA:  Shortness of breath.  EXAM: PORTABLE CHEST - 1 VIEW  COMPARISON:  December 04, 2013.  FINDINGS: Stable cardiomediastinal silhouette. No pneumothorax or pleural effusion is noted. Mild central pulmonary vascular congestion is noted. Bony thorax is intact. No consolidative process is noted.  IMPRESSION: Mild central pulmonary vascular congestion.   Electronically Signed   By: Sabino Dick M.D.   On: 12/31/2013 07:47     EKG Interpretation   Date/Time:  Saturday December 31 2013 06:06:42 EDT Ventricular Rate:  96 PR Interval:  184 QRS Duration: 88 QT Interval:  364 QTC Calculation: 460 R Axis:   82 Text Interpretation:  Sinus rhythm Borderline right axis deviation No  significant change was found Confirmed by Karmen Altamirano  MD, Honest Safranek ([id number]) on  12/31/2013 6:22:41 AM      MDM   Final diagnoses:  None    Suspect mild COPD exacerbation.  I think the patient would benefit from steroids but she is very hesitant to take these.  Chest x-ray pending.  Patient states she feels much better after her initial breathing treatment.  Her other joint pain sounds nonacute.  She was able to dance tonight at the [place], MD 12/31/13 [place], MD 12/31/13 [phone]

## 2013-12-31 NOTE — Discharge Instructions (Signed)

## 2013-12-31 NOTE — ED Notes (Signed)
Pt arrived from home by EMS. Pt reports having cold & flu symptoms, right knee & left shoulder pain. Pt presents w/ cough and wheezing

## 2014-01-10 ENCOUNTER — Encounter: Payer: Self-pay | Admitting: Adult Health

## 2014-01-10 ENCOUNTER — Ambulatory Visit (INDEPENDENT_AMBULATORY_CARE_PROVIDER_SITE_OTHER): Payer: Medicare Other | Admitting: Adult Health

## 2014-01-10 VITALS — BP 142/58 | Ht 66.0 in | Wt 231.0 lb

## 2014-01-10 DIAGNOSIS — L0293 Carbuncle, unspecified: Secondary | ICD-10-CM

## 2014-01-10 DIAGNOSIS — L0292 Furuncle, unspecified: Secondary | ICD-10-CM

## 2014-01-10 MED ORDER — SULFAMETHOXAZOLE-TMP DS 800-160 MG PO TABS: 1.0000 | ORAL_TABLET | Freq: Two times a day (BID) | ORAL | Status: DC

## 2014-01-10 MED ORDER — FLUCONAZOLE 150 MG PO TABS: ORAL_TABLET | ORAL | Status: DC

## 2014-01-10 NOTE — Progress Notes (Signed)
Subjective:     Patient ID: Stephanie Combs, female   DOB: 1950-05-21, 63 y.o.   MRN: [id number]  HPI Stephanie Combs is a 63 year old white female, widowed, in for boil on left labia, she says it hurts to wipe or wear panties, has had them before.She has lost about 168 lbs and is diabetic.She says she has been on antibiotics.She is walking with walker.  Review of Systems See HPI Reviewed past medical,surgical, social and family history. Reviewed medications and allergies.     Objective:   Physical Exam BP 142/58  Ht 5\' 6"  (1.676 m)  Wt 231 lb (104.781 kg)  BMI 37.30 kg/m2Has huge panniculus,   Has boil left labia,cleansed with betadine and injected with about 5 cc 2% lidocaine and then lanced with # 11 blade by Dr Bonner Puna,, and expressed about 20 cc bloody purulent fluid.Pad placed, let continue to drain.Keep clean and I gave her extra pads. She requests refill on diflucan because she gets yeast easy after antibiotics.  Assessment:     Boil left labia    Plan:     Rx septra ds #28 1 bid x 14 days with 1 refill Rx diflucan 150 mg #2 1 now and 1 in 3 days with 2 refills Rx fanny cream 120 oz use 3-4 x daily prn with 1 refill Follow up in 2 weeks for pap and physical and recheck

## 2014-01-10 NOTE — Patient Instructions (Signed)
Abscess An abscess is an infected area that contains a collection of pus and debris.It can occur in almost any part of the body. An abscess is also known as a furuncle or boil. CAUSES  An abscess occurs when tissue gets infected. This can occur from blockage of oil or sweat glands, infection of hair follicles, or a minor injury to the skin. As the body tries to fight the infection, pus collects in the area and creates pressure under the skin. This pressure causes pain. People with weakened immune systems have difficulty fighting infections and get certain abscesses more often.  SYMPTOMS Usually an abscess develops on the skin and becomes a painful mass that is red, warm, and tender. If the abscess forms under the skin, you may feel a moveable soft area under the skin. Some abscesses break open (rupture) on their own, but most will continue to get worse without care. The infection can spread deeper into the body and eventually into the bloodstream, causing you to feel ill.  DIAGNOSIS  Your caregiver will take your medical history and perform a physical exam. A sample of fluid may also be taken from the abscess to determine what is causing your infection. TREATMENT  Your caregiver may prescribe antibiotic medicines to fight the infection. However, taking antibiotics alone usually does not cure an abscess. Your caregiver may need to make a small cut (incision) in the abscess to drain the pus. In some cases, gauze is packed into the abscess to reduce pain and to continue draining the area. HOME CARE INSTRUCTIONS   Only take over-the-counter or prescription medicines for pain, discomfort, or fever as directed by your caregiver.  If you were prescribed antibiotics, take them as directed. Finish them even if you start to feel better.  If gauze is used, follow your caregiver's directions for changing the gauze.  To avoid spreading the infection:  Keep your draining abscess covered with a  bandage.  Wash your hands well.  Do not share personal care items, towels, or whirlpools with others.  Avoid skin contact with others.  Keep your skin and clothes clean around the abscess.  Keep all follow-up appointments as directed by your caregiver. SEEK MEDICAL CARE IF:   You have increased pain, swelling, redness, fluid drainage, or bleeding.  You have muscle aches, chills, or a general ill feeling.  You have a fever. MAKE SURE YOU:   Understand these instructions.  Will watch your condition.  Will get help right away if you are not doing well or get worse. Document Released: 02/05/2005 Document Revised: 10/28/2011 Document Reviewed: 07/11/2011 [facility] Patient Information 2015 [place], [place]. This information is not intended to replace advice given to you by your health care provider. Make sure you discuss any questions you have with your health care provider. Follow up in 2 weeks for pap and physical

## 2014-01-24 ENCOUNTER — Other Ambulatory Visit: Payer: Medicare Other | Admitting: Adult Health

## 2014-01-25 ENCOUNTER — Other Ambulatory Visit: Payer: Medicare Other | Admitting: Adult Health

## 2014-08-24 ENCOUNTER — Telehealth: Payer: Self-pay | Admitting: Adult Health

## 2014-08-24 NOTE — Telephone Encounter (Signed)
RX for "[name]," 60 g apply as needed with 3 refills called to [facility] per Nigel Berthold, CNM.

## 2014-08-24 NOTE — Telephone Encounter (Signed)
Please call pharmacy and rx "[name], apply as needed, disp " 60g with 3 refills"  Thanks!

## 2014-11-07 ENCOUNTER — Inpatient Hospital Stay (HOSPITAL_COMMUNITY)
Admission: EM | Admit: 2014-11-07 | Discharge: 2014-11-10 | DRG: 292 | Disposition: A | Discharge status: Home Health | Payer: Medicare Other | Attending: Internal Medicine | Admitting: Internal Medicine

## 2014-11-07 ENCOUNTER — Emergency Department (HOSPITAL_COMMUNITY): Payer: Medicare Other

## 2014-11-07 ENCOUNTER — Inpatient Hospital Stay (HOSPITAL_COMMUNITY): Payer: Medicare Other

## 2014-11-07 ENCOUNTER — Other Ambulatory Visit (HOSPITAL_COMMUNITY): Payer: Medicare Other

## 2014-11-07 ENCOUNTER — Encounter (HOSPITAL_COMMUNITY): Payer: Self-pay | Admitting: Emergency Medicine

## 2014-11-07 DIAGNOSIS — Z7952 Long term (current) use of systemic steroids: Secondary | ICD-10-CM

## 2014-11-07 DIAGNOSIS — Z833 Family history of diabetes mellitus: Secondary | ICD-10-CM

## 2014-11-07 DIAGNOSIS — Z8249 Family history of ischemic heart disease and other diseases of the circulatory system: Secondary | ICD-10-CM | POA: Diagnosis not present

## 2014-11-07 DIAGNOSIS — E13621 Other specified diabetes mellitus with foot ulcer: Secondary | ICD-10-CM | POA: Diagnosis present

## 2014-11-07 DIAGNOSIS — Z9981 Dependence on supplemental oxygen: Secondary | ICD-10-CM | POA: Diagnosis not present

## 2014-11-07 DIAGNOSIS — Z6837 Body mass index (BMI) 37.0-37.9, adult: Secondary | ICD-10-CM | POA: Diagnosis not present

## 2014-11-07 DIAGNOSIS — Z794 Long term (current) use of insulin: Secondary | ICD-10-CM | POA: Diagnosis not present

## 2014-11-07 DIAGNOSIS — F419 Anxiety disorder, unspecified: Secondary | ICD-10-CM | POA: Diagnosis present

## 2014-11-07 DIAGNOSIS — Z79891 Long term (current) use of opiate analgesic: Secondary | ICD-10-CM | POA: Diagnosis not present

## 2014-11-07 DIAGNOSIS — F17210 Nicotine dependence, cigarettes, uncomplicated: Secondary | ICD-10-CM | POA: Diagnosis present

## 2014-11-07 DIAGNOSIS — R600 Localized edema: Secondary | ICD-10-CM

## 2014-11-07 DIAGNOSIS — N183 Chronic kidney disease, stage 3 unspecified: Secondary | ICD-10-CM | POA: Diagnosis present

## 2014-11-07 DIAGNOSIS — J9610 Chronic respiratory failure, unspecified whether with hypoxia or hypercapnia: Secondary | ICD-10-CM | POA: Diagnosis present

## 2014-11-07 DIAGNOSIS — I129 Hypertensive chronic kidney disease with stage 1 through stage 4 chronic kidney disease, or unspecified chronic kidney disease: Secondary | ICD-10-CM | POA: Diagnosis present

## 2014-11-07 DIAGNOSIS — L97509 Non-pressure chronic ulcer of other part of unspecified foot with unspecified severity: Secondary | ICD-10-CM | POA: Diagnosis present

## 2014-11-07 DIAGNOSIS — E11621 Type 2 diabetes mellitus with foot ulcer: Secondary | ICD-10-CM | POA: Diagnosis present

## 2014-11-07 DIAGNOSIS — E039 Hypothyroidism, unspecified: Secondary | ICD-10-CM | POA: Diagnosis present

## 2014-11-07 DIAGNOSIS — L97519 Non-pressure chronic ulcer of other part of right foot with unspecified severity: Secondary | ICD-10-CM | POA: Diagnosis present

## 2014-11-07 DIAGNOSIS — J449 Chronic obstructive pulmonary disease, unspecified: Secondary | ICD-10-CM | POA: Diagnosis present

## 2014-11-07 DIAGNOSIS — I5032 Chronic diastolic (congestive) heart failure: Secondary | ICD-10-CM

## 2014-11-07 DIAGNOSIS — M869 Osteomyelitis, unspecified: Secondary | ICD-10-CM | POA: Diagnosis present

## 2014-11-07 DIAGNOSIS — I5031 Acute diastolic (congestive) heart failure: Secondary | ICD-10-CM | POA: Diagnosis not present

## 2014-11-07 DIAGNOSIS — E11649 Type 2 diabetes mellitus with hypoglycemia without coma: Secondary | ICD-10-CM | POA: Diagnosis not present

## 2014-11-07 DIAGNOSIS — Z79899 Other long term (current) drug therapy: Secondary | ICD-10-CM

## 2014-11-07 DIAGNOSIS — R0600 Dyspnea, unspecified: Secondary | ICD-10-CM | POA: Diagnosis not present

## 2014-11-07 DIAGNOSIS — Z823 Family history of stroke: Secondary | ICD-10-CM | POA: Diagnosis not present

## 2014-11-07 DIAGNOSIS — E1142 Type 2 diabetes mellitus with diabetic polyneuropathy: Secondary | ICD-10-CM | POA: Diagnosis present

## 2014-11-07 DIAGNOSIS — Z7982 Long term (current) use of aspirin: Secondary | ICD-10-CM

## 2014-11-07 DIAGNOSIS — I5033 Acute on chronic diastolic (congestive) heart failure: Secondary | ICD-10-CM | POA: Diagnosis not present

## 2014-11-07 DIAGNOSIS — E119 Type 2 diabetes mellitus without complications: Secondary | ICD-10-CM

## 2014-11-07 DIAGNOSIS — R0602 Shortness of breath: Secondary | ICD-10-CM

## 2014-11-07 DIAGNOSIS — L97529 Non-pressure chronic ulcer of other part of left foot with unspecified severity: Secondary | ICD-10-CM

## 2014-11-07 LAB — GLUCOSE, CAPILLARY
Glucose-Capillary: 416 mg/dL — ABNORMAL HIGH (ref 65–99)
Glucose-Capillary: 582 mg/dL (ref 65–99)
Glucose-Capillary: 435 mg/dL — ABNORMAL HIGH (ref 65–99)
Glucose-Capillary: 411 mg/dL — ABNORMAL HIGH (ref 65–99)
Glucose-Capillary: 359 mg/dL — ABNORMAL HIGH (ref 65–99)

## 2014-11-07 LAB — BASIC METABOLIC PANEL
Anion gap: 8 (ref 5–15)
BUN: 30 mg/dL — ABNORMAL HIGH (ref 6–20)
CO2: 29 mmol/L (ref 22–32)
Calcium: 8.6 mg/dL — ABNORMAL LOW (ref 8.9–10.3)
Chloride: 98 mmol/L — ABNORMAL LOW (ref 101–111)
Creatinine, Ser: 1.33 mg/dL — ABNORMAL HIGH (ref 0.44–1.00)
GFR calc Af Amer: 48 mL/min — ABNORMAL LOW (ref >60)
GFR calc non Af Amer: 41 mL/min — ABNORMAL LOW (ref >60)
Glucose, Bld: 271 mg/dL — ABNORMAL HIGH (ref 65–99)
Potassium: 4 mmol/L (ref 3.5–5.1)
Sodium: 135 mmol/L (ref 135–145)

## 2014-11-07 LAB — CBC WITH DIFFERENTIAL/PLATELET
Basophils Absolute: 0.1 10*3/uL (ref 0.0–0.1)
Basophils Relative: 1 % (ref 0–1)
Eosinophils Absolute: 0.2 10*3/uL (ref 0.0–0.7)
Eosinophils Relative: 2 % (ref 0–5)
HCT: 39.7 % (ref 36.0–46.0)
Hemoglobin: 11.8 g/dL — ABNORMAL LOW (ref 12.0–15.0)
Lymphocytes Relative: 23 % (ref 12–46)
Lymphs Abs: 1.8 10*3/uL (ref 0.7–4.0)
MCH: 26 pg (ref 26.0–34.0)
MCHC: 29.7 g/dL — ABNORMAL LOW (ref 30.0–36.0)
MCV: 87.4 fL (ref 78.0–100.0)
Monocytes Absolute: 0.3 10*3/uL (ref 0.1–1.0)
Monocytes Relative: 4 % (ref 3–12)
Neutro Abs: 5.8 10*3/uL (ref 1.7–7.7)
Neutrophils Relative %: 70 % (ref 43–77)
Platelets: 290 10*3/uL (ref 150–400)
RBC: 4.54 MIL/uL (ref 3.87–5.11)
RDW: 16.9 % — ABNORMAL HIGH (ref 11.5–15.5)
WBC: 8.2 10*3/uL (ref 4.0–10.5)

## 2014-11-07 LAB — TSH: TSH: 1.168 u[IU]/mL (ref 0.350–4.500)

## 2014-11-07 LAB — TROPONIN I: Troponin I: <0.03 ng/mL (ref <0.031)

## 2014-11-07 LAB — BRAIN NATRIURETIC PEPTIDE: B Natriuretic Peptide: 102 pg/mL — ABNORMAL HIGH (ref 0.0–100.0)

## 2014-11-07 MED ORDER — INSULIN ASPART 100 UNIT/ML ~~LOC~~ SOLN
20.0000 [IU] | Freq: Once | SUBCUTANEOUS | Status: AC
  Administered 2014-11-07: 20 [IU] via SUBCUTANEOUS

## 2014-11-07 MED ORDER — ALBUTEROL SULFATE (2.5 MG/3ML) 0.083% IN NEBU: 3.0000 mL | INHALATION_SOLUTION | RESPIRATORY_TRACT | Status: DC | PRN

## 2014-11-07 MED ORDER — INSULIN DETEMIR 100 UNIT/ML ~~LOC~~ SOLN
40.0000 [IU] | Freq: Two times a day (BID) | SUBCUTANEOUS | Status: DC
  Administered 2014-11-07 – 2014-11-10 (×6): 40 [IU] via SUBCUTANEOUS
  Filled 2014-11-07 (×12): qty 0.4

## 2014-11-07 MED ORDER — INSULIN ASPART 100 UNIT/ML ~~LOC~~ SOLN: 0.0000 [IU] | Freq: Every day | SUBCUTANEOUS | Status: DC

## 2014-11-07 MED ORDER — DILTIAZEM HCL ER COATED BEADS 240 MG PO CP24
240.0000 mg | ORAL_CAPSULE | Freq: Every day | ORAL | Status: DC
  Administered 2014-11-07: 240 mg via ORAL
  Filled 2014-11-07: qty 1

## 2014-11-07 MED ORDER — IPRATROPIUM-ALBUTEROL 0.5-2.5 (3) MG/3ML IN SOLN
3.0000 mL | Freq: Four times a day (QID) | RESPIRATORY_TRACT | Status: DC
  Administered 2014-11-07 – 2014-11-10 (×14): 3 mL via RESPIRATORY_TRACT
  Filled 2014-11-07 (×14): qty 3

## 2014-11-07 MED ORDER — GABAPENTIN 300 MG PO CAPS
600.0000 mg | ORAL_CAPSULE | Freq: Four times a day (QID) | ORAL | Status: DC
  Administered 2014-11-07 – 2014-11-10 (×14): 600 mg via ORAL
  Filled 2014-11-07 (×12): qty 2
  Filled 2014-11-07: qty 6
  Filled 2014-11-07 (×2): qty 2

## 2014-11-07 MED ORDER — VANCOMYCIN HCL 10 G IV SOLR
1250.0000 mg | INTRAVENOUS | Status: DC
  Administered 2014-11-08 – 2014-11-10 (×3): 1250 mg via INTRAVENOUS
  Filled 2014-11-07 (×6): qty 1250

## 2014-11-07 MED ORDER — POTASSIUM CHLORIDE CRYS ER 20 MEQ PO TBCR: 20.0000 meq | EXTENDED_RELEASE_TABLET | Freq: Two times a day (BID) | ORAL | Status: DC

## 2014-11-07 MED ORDER — DOCUSATE SODIUM 100 MG PO CAPS
100.0000 mg | ORAL_CAPSULE | Freq: Two times a day (BID) | ORAL | Status: DC
  Administered 2014-11-07 – 2014-11-10 (×7): 100 mg via ORAL
  Filled 2014-11-07 (×7): qty 1

## 2014-11-07 MED ORDER — OMEGA-3-ACID ETHYL ESTERS 1 G PO CAPS
1.0000 g | ORAL_CAPSULE | Freq: Two times a day (BID) | ORAL | Status: DC
  Administered 2014-11-07 – 2014-11-10 (×7): 1 g via ORAL
  Filled 2014-11-07 (×7): qty 1

## 2014-11-07 MED ORDER — SODIUM CHLORIDE 0.9 % IJ SOLN
3.0000 mL | Freq: Two times a day (BID) | INTRAMUSCULAR | Status: DC
  Administered 2014-11-07 – 2014-11-10 (×4): 3 mL via INTRAVENOUS

## 2014-11-07 MED ORDER — INSULIN ASPART 100 UNIT/ML ~~LOC~~ SOLN: 0.0000 [IU] | Freq: Three times a day (TID) | SUBCUTANEOUS | Status: DC

## 2014-11-07 MED ORDER — DILTIAZEM HCL ER COATED BEADS 180 MG PO CP24
180.0000 mg | ORAL_CAPSULE | Freq: Every day | ORAL | Status: DC
  Administered 2014-11-08 – 2014-11-10 (×3): 180 mg via ORAL
  Filled 2014-11-07 (×3): qty 1

## 2014-11-07 MED ORDER — OXYCODONE-ACETAMINOPHEN 5-325 MG PO TABS
1.0000 | ORAL_TABLET | ORAL | Status: DC | PRN
  Administered 2014-11-07 – 2014-11-10 (×10): 1 via ORAL
  Filled 2014-11-07 (×10): qty 1

## 2014-11-07 MED ORDER — INSULIN ASPART 100 UNIT/ML ~~LOC~~ SOLN
10.0000 [IU] | Freq: Three times a day (TID) | SUBCUTANEOUS | Status: DC
  Administered 2014-11-07 – 2014-11-08 (×3): 10 [IU] via SUBCUTANEOUS

## 2014-11-07 MED ORDER — LEVOTHYROXINE SODIUM 50 MCG PO TABS
250.0000 ug | ORAL_TABLET | Freq: Every day | ORAL | Status: DC
  Administered 2014-11-07 – 2014-11-10 (×4): 250 ug via ORAL
  Filled 2014-11-07 (×2): qty 1
  Filled 2014-11-07 (×3): qty 2
  Filled 2014-11-07 (×2): qty 1
  Filled 2014-11-07: qty 2

## 2014-11-07 MED ORDER — FUROSEMIDE 10 MG/ML IJ SOLN: 20.0000 mg | Freq: Every day | INTRAMUSCULAR | Status: DC

## 2014-11-07 MED ORDER — INSULIN NPH (HUMAN) (ISOPHANE) 100 UNIT/ML ~~LOC~~ SUSP
10.0000 [IU] | Freq: Once | SUBCUTANEOUS | Status: AC
  Administered 2014-11-07: 10 [IU] via SUBCUTANEOUS
  Filled 2014-11-07: qty 10

## 2014-11-07 MED ORDER — INSULIN ASPART 100 UNIT/ML ~~LOC~~ SOLN
0.0000 [IU] | SUBCUTANEOUS | Status: DC
  Administered 2014-11-07 (×2): 20 [IU] via SUBCUTANEOUS
  Administered 2014-11-08 (×3): 7 [IU] via SUBCUTANEOUS
  Administered 2014-11-08 – 2014-11-09 (×3): 11 [IU] via SUBCUTANEOUS
  Administered 2014-11-09: 4 [IU] via SUBCUTANEOUS
  Administered 2014-11-09: 3 [IU] via SUBCUTANEOUS
  Administered 2014-11-09 (×2): 7 [IU] via SUBCUTANEOUS
  Administered 2014-11-10 (×3): 4 [IU] via SUBCUTANEOUS

## 2014-11-07 MED ORDER — INSULIN ASPART 100 UNIT/ML ~~LOC~~ SOLN
10.0000 [IU] | Freq: Once | SUBCUTANEOUS | Status: AC
  Administered 2014-11-07: 10 [IU] via SUBCUTANEOUS

## 2014-11-07 MED ORDER — INSULIN DETEMIR 100 UNIT/ML ~~LOC~~ SOLN
40.0000 [IU] | Freq: Every day | SUBCUTANEOUS | Status: DC
  Filled 2014-11-07: qty 0.4

## 2014-11-07 MED ORDER — FUROSEMIDE 10 MG/ML IJ SOLN
80.0000 mg | Freq: Once | INTRAMUSCULAR | Status: AC
  Administered 2014-11-07: 80 mg via INTRAVENOUS
  Filled 2014-11-07: qty 8

## 2014-11-07 MED ORDER — INSULIN ASPART 100 UNIT/ML ~~LOC~~ SOLN: 12.0000 [IU] | Freq: Three times a day (TID) | SUBCUTANEOUS | Status: DC

## 2014-11-07 MED ORDER — SODIUM CHLORIDE 0.9 % IV BOLUS (SEPSIS)
1000.0000 mL | Freq: Once | INTRAVENOUS | Status: AC
Start: 2014-11-07 — End: 2014-11-07
  Administered 2014-11-07: 1000 mL via INTRAVENOUS

## 2014-11-07 MED ORDER — VANCOMYCIN HCL 10 G IV SOLR
2000.0000 mg | Freq: Once | INTRAVENOUS | Status: AC
  Administered 2014-11-07: 2000 mg via INTRAVENOUS
  Filled 2014-11-07: qty 1000

## 2014-11-07 MED ORDER — POTASSIUM CHLORIDE CRYS ER 20 MEQ PO TBCR
30.0000 meq | EXTENDED_RELEASE_TABLET | Freq: Two times a day (BID) | ORAL | Status: AC
  Administered 2014-11-07 – 2014-11-08 (×2): 30 meq via ORAL
  Filled 2014-11-07 (×4): qty 1

## 2014-11-07 MED ORDER — POTASSIUM CHLORIDE IN NACL 20-0.9 MEQ/L-% IV SOLN
INTRAVENOUS | Status: DC
  Administered 2014-11-07 – 2014-11-08 (×2): via INTRAVENOUS

## 2014-11-07 MED ORDER — PANTOPRAZOLE SODIUM 40 MG PO TBEC
40.0000 mg | DELAYED_RELEASE_TABLET | Freq: Two times a day (BID) | ORAL | Status: DC
  Administered 2014-11-07 – 2014-11-10 (×7): 40 mg via ORAL
  Filled 2014-11-07 (×7): qty 1

## 2014-11-07 MED ORDER — POTASSIUM CHLORIDE CRYS ER 10 MEQ PO TBCR
20.0000 meq | EXTENDED_RELEASE_TABLET | Freq: Every day | ORAL | Status: DC
  Administered 2014-11-07 – 2014-11-10 (×4): 20 meq via ORAL
  Filled 2014-11-07 (×6): qty 2

## 2014-11-07 MED ORDER — ALPRAZOLAM 0.5 MG PO TABS
2.0000 mg | ORAL_TABLET | Freq: Once | ORAL | Status: AC
  Administered 2014-11-07: 2 mg via ORAL
  Filled 2014-11-07: qty 4

## 2014-11-07 MED ORDER — HEPARIN SODIUM (PORCINE) 5000 UNIT/ML IJ SOLN
5000.0000 [IU] | Freq: Three times a day (TID) | INTRAMUSCULAR | Status: DC
  Administered 2014-11-07 – 2014-11-10 (×11): 5000 [IU] via SUBCUTANEOUS
  Filled 2014-11-07 (×11): qty 1

## 2014-11-07 MED ORDER — NICOTINE 21 MG/24HR TD PT24
21.0000 mg | MEDICATED_PATCH | Freq: Every day | TRANSDERMAL | Status: DC
  Administered 2014-11-07 – 2014-11-10 (×4): 21 mg via TRANSDERMAL
  Filled 2014-11-07 (×4): qty 1

## 2014-11-07 MED ORDER — ALPRAZOLAM 1 MG PO TABS
1.0000 mg | ORAL_TABLET | Freq: Four times a day (QID) | ORAL | Status: DC | PRN
  Administered 2014-11-07 – 2014-11-10 (×8): 1 mg via ORAL
  Filled 2014-11-07 (×8): qty 1

## 2014-11-07 MED ORDER — FUROSEMIDE 10 MG/ML IJ SOLN
40.0000 mg | Freq: Two times a day (BID) | INTRAMUSCULAR | Status: DC
  Filled 2014-11-07: qty 4

## 2014-11-07 MED ORDER — ASPIRIN EC 81 MG PO TBEC
81.0000 mg | DELAYED_RELEASE_TABLET | Freq: Every day | ORAL | Status: DC
  Administered 2014-11-07 – 2014-11-10 (×4): 81 mg via ORAL
  Filled 2014-11-07 (×4): qty 1

## 2014-11-07 MED ORDER — FUROSEMIDE 10 MG/ML IJ SOLN
20.0000 mg | Freq: Two times a day (BID) | INTRAMUSCULAR | Status: DC
  Administered 2014-11-07 – 2014-11-08 (×2): 20 mg via INTRAVENOUS
  Filled 2014-11-07 (×2): qty 2

## 2014-11-07 NOTE — ED Notes (Signed)
Noted open wound right big toe. Patient stated that a big chunk of meat fell out of bottom of big toe this am.

## 2014-11-07 NOTE — ED Provider Notes (Signed)
CSN: [id number]     Arrival date & time 11/07/14  0121 History   First MD Initiated Contact with Patient 11/07/14 0143     Chief Complaint  Patient presents with  . Shortness of Breath     (Consider location/radiation/quality/duration/timing/severity/associated sxs/prior Treatment) HPI  This is a 65 year old with a history of hypertension, diabetes, COPD on chronic oxygen, CHF who presents with worsening lower extremity edema and shortness of breath. Patient states over the last several weeks she has seen her doctor multiple times for worsening lower extremity edema. Her Lasix was increased and she was given an additional medication to try to help. She states that it is not helping. She also reports persistent shortness of breath. Denies any fever or cough. Reports chest discomfort which is on the left side worse worse with palpation.  States that she has been taking nitroglycerin more frequently. Currently denies chest pain.  Past Medical History  Diagnosis Date  . Hypertension   . Diabetes mellitus without complication   . COPD (chronic obstructive pulmonary disease)   . CHF (congestive heart failure)     Diastolic  . Anxiety   . Chronic back pain   . Hypothyroidism   . Pedal edema   . On home O2     2.5 L N/C prn  . Degenerative disk disease   . Boil 01/10/2014   Past Surgical History  Procedure Laterality Date  . Tubal ligation    . Hernia repair     Family History  Problem Relation Age of Onset  . Stroke Mother   . Heart attack Father   . Diabetes Brother   . Cerebral palsy Brother   . Pneumonia Brother   . Diabetes Other   . Heart attack Other    History  Substance Use Topics  . Smoking status: Current Every Day Smoker -- 0.25 packs/day    Types: Cigarettes  . Smokeless tobacco: Never Used  . Alcohol Use: No   OB History    No data available     Review of Systems  Constitutional: Negative for fever.  Respiratory: Positive for chest tightness and shortness  of breath. Negative for cough.   Cardiovascular: Positive for chest pain and leg swelling.  Gastrointestinal: Negative for nausea, vomiting and abdominal pain.  Genitourinary: Negative for dysuria.  Musculoskeletal: Negative for back pain.  Skin: Positive for wound. Negative for color change.  Neurological: Negative for headaches.  Psychiatric/Behavioral: Negative for confusion.  All other systems reviewed and are negative.     Allergies  Dilaudid; Oxycontin; Reglan; Valium; and Vistaril  Home Medications   Prior to Admission medications   Medication Sig Start Date End Date Taking? Authorizing Provider  ACCU-CHEK AVIVA PLUS test strip  12/29/13   Historical Provider, MD  ADACEL 09-10-13.5 LF-MCG/0.5 injection  12/27/13   Historical Provider, MD  albuterol (PROAIR HFA) 108 (90 BASE) MCG/ACT inhaler Inhale 1 puff into the lungs every 4 (four) hours as needed. For shortness of breath 05/30/13   Kinnie Feil, MD  alprazolam Duanne Moron) 2 MG tablet Take 2 mg by mouth 4 (four) times daily.    Historical Provider, MD  aspirin EC 81 MG tablet Take 81 mg by mouth daily.    Historical Provider, MD  benzonatate (TESSALON) 100 MG capsule Take 1 capsule (100 mg total) by mouth 3 (three) times daily as needed for cough. 12/31/13   Jola Schmidt, MD  Blood Glucose Monitoring Suppl (ACCU-CHEK AVIVA PLUS) W/DEVICE KIT  11/07/13  Historical Provider, MD  diltiazem (CARDIZEM CD) 240 MG 24 hr capsule Take 1 capsule (240 mg total) by mouth daily. 05/30/13   Kinnie Feil, MD  docusate sodium 100 MG CAPS Take 100 mg by mouth 2 (two) times daily. 05/30/13   Kinnie Feil, MD  fluconazole (DIFLUCAN) 150 MG tablet Take 1 now and 1 in 3 days 01/10/14   Estill Dooms, NP  furosemide (LASIX) 20 MG tablet Take 3 tablets (60 mg total) by mouth 2 (two) times daily. 05/30/13   Kinnie Feil, MD  gabapentin (NEURONTIN) 600 MG tablet Take 1 tablet (600 mg total) by mouth 4 (four) times daily. 05/30/13   Kinnie Feil, MD  glipiZIDE-metformin (METAGLIP) 5-500 MG per tablet Take 1 tablet by mouth 2 (two) times daily before a meal. Takes 2 every am and 2 every pm    Historical Provider, MD  insulin aspart (NOVOLOG) 100 UNIT/ML injection Inject 0-20 Units into the skin 3 (three) times daily with meals. Takes 25 Units in am and 25 Units at night 05/30/13   Kinnie Feil, MD  insulin detemir (LEVEMIR) 100 UNIT/ML injection Inject 40 Units into the skin at bedtime. Takes 40 Units in am and 40 Units at night.    Historical Provider, MD  ipratropium-albuterol (DUONEB) 0.5-2.5 (3) MG/3ML SOLN Take 3 mLs by nebulization every 6 (six) hours. 05/30/13   Kinnie Feil, MD  levothyroxine (SYNTHROID, LEVOTHROID) 125 MCG tablet Take 2 tablets (250 mcg total) by mouth daily before breakfast. 05/30/13   Kinnie Feil, MD  meclizine (ANTIVERT) 25 MG tablet Take 25 mg by mouth 2 (two) times daily. 04/06/13   Historical Provider, MD  nicotine (NICODERM CQ - DOSED IN MG/24 HOURS) 21 mg/24hr patch Place 1 patch (21 mg total) onto the skin daily. 05/30/13   Kinnie Feil, MD  Omega-3 Fatty Acids (FISH OIL PO) Take 1 capsule by mouth 2 (two) times daily.     Historical Provider, MD  ondansetron (ZOFRAN ODT) 8 MG disintegrating tablet Take 1 tablet (8 mg total) by mouth every 8 (eight) hours as needed for nausea or vomiting. 12/31/13   Jola Schmidt, MD  oxyCODONE-acetaminophen (PERCOCET) 10-325 MG per tablet Take 1 tablet by mouth every 4 (four) hours as needed for pain.    Historical Provider, MD  pantoprazole (PROTONIX) 40 MG tablet Take 1 tablet (40 mg total) by mouth 2 (two) times daily. 04/21/13   Radene Gunning, NP  potassium chloride (K-DUR) 10 MEQ tablet Take 20 mEq by mouth daily.     Historical Provider, MD  predniSONE (DELTASONE) 20 MG tablet Take 80 mg by mouth once. 12/04/13   Merryl Hacker, MD  sulfamethoxazole-trimethoprim (BACTRIM DS) 800-160 MG per tablet Take 1 tablet by mouth 2 (two) times daily. 01/10/14    Estill Dooms, NP   BP 101/43 mmHg  Pulse 76  Resp 21  Ht '5\' 6"'  (1.676 m)  Wt 234 lb (106.142 kg)  BMI 37.79 kg/m2  SpO2 100% Physical Exam  Constitutional: She is oriented to person, place, and time. No distress.  Obese, chronically ill-appearing, no acute distress  HENT:  Head: Normocephalic and atraumatic.  Eyes: Pupils are equal, round, and reactive to light.  Cardiovascular: Normal rate, regular rhythm and normal heart sounds.   No murmur heard. Pulmonary/Chest: Effort normal. No respiratory distress. She has wheezes. She exhibits tenderness.  Abdominal: Soft. Bowel sounds are normal. There is no tenderness. There is no rebound.  Musculoskeletal:  3+ bilateral lower extremity edema  Neurological: She is alert and oriented to person, place, and time.  Skin: Skin is warm and dry.  0.5 cm Wound noted on the plantar aspect of the right first toe, clear margins, no active drainage, no surrounding erythema  Psychiatric: She has a normal mood and affect.  Nursing note and vitals reviewed.   ED Course  Procedures (including critical care time) Labs Review Labs Reviewed  CBC WITH DIFFERENTIAL/PLATELET - Abnormal; Notable for the following:    Hemoglobin 11.8 (*)    MCHC 29.7 (*)    RDW 16.9 (*)    All other components within normal limits  BASIC METABOLIC PANEL - Abnormal; Notable for the following:    Chloride 98 (*)    Glucose, Bld 271 (*)    BUN 30 (*)    Creatinine, Ser 1.33 (*)    Calcium 8.6 (*)    GFR calc non Af Amer 41 (*)    GFR calc Af Amer 48 (*)    All other components within normal limits  BRAIN NATRIURETIC PEPTIDE - Abnormal; Notable for the following:    B Natriuretic Peptide 102.0 (*)    All other components within normal limits  TROPONIN I    Imaging Review Dg Chest Portable 1 View  11/07/2014   CLINICAL DATA:  Dyspnea for 2 weeks.  EXAM: PORTABLE CHEST - 1 VIEW  COMPARISON:  07/19/2014  FINDINGS: A single AP portable view of the chest  demonstrates no focal airspace consolidation or alveolar edema. The lungs are grossly clear. There is no large effusion or pneumothorax. Cardiac and mediastinal contours appear unremarkable.  IMPRESSION: No active disease.   Electronically Signed   By: Andreas Newport M.D.   On: 11/07/2014 02:57     EKG Interpretation   Date/Time:  Tuesday November 07 2014 01:54:05 EDT Ventricular Rate:  80 PR Interval:  182 QRS Duration: 96 QT Interval:  397 QTC Calculation: 458 R Axis:   77 Text Interpretation:  Sinus rhythm Atrial premature complex Consider left  atrial enlargement Confirmed by Beaux Wedemeyer  MD, Tryone Kille ([id number]) on 11/07/2014  3:14:21 AM      MDM   Final diagnoses:  Bilateral edema of lower extremity  Shortness of breath    Patient presents with worsening shortness of breath and lower extremity edema the last several weeks. Nontoxic on exam. Nasal cannula in place. Scattered wheezing. No acute respiratory distress. Patient has impressive bilateral lower extremity edema to the thigh.  Lab work is largely reassuring and at the patient's baseline. Troponin is negative. EKG is nonischemic. Chest x-ray shows no evidence of pulmonary edema. Suspect scattered wheezing is secondary to known COPD. Patient given 80 mg of IV Lasix. Patient reports that she is having difficulty ambulating secondary to her lower extremity edema. Given this and the extent of physical exam findings, will admit for diuresis.      Merryl Hacker, MD 11/07/14 [phone]

## 2014-11-07 NOTE — H&P (Signed)
[facility] History and Physical  Stephanie Combs ZDG:[id number] DOB: [birth year]    PCP:   Neale Burly, MD   Chief Complaint: swelling of the lower extremities.   HPI: Stephanie Combs is an 64 y.o. female with hx of COPD, DM with neuropathy, right foot ulcer, anxiety, diastolic CHF, hypothyroidism, lives at home alone, presents to the ER with bilateral leg edema.  She has swelling and pain so she can't walk.  She recently had debridement of her right foot.  She had no chest pain, orthopnea, PND, fever, chills, coughs or calf tenderness.  Evaluation in the ER showed normal WBC, Hb 11.8 g per dL, normal K, and BNP of 102.  EKG showed NSR with no acute ST T changes. CXR showed no active disease.  She said her Lasix has not worked for her.  In the ER, she was given 78m IV Lasix, and hospitalist was asked to admit her for acute on chronic CHF.   Rewiew of Systems:  Constitutional: Negative for malaise, fever and chills. No significant weight loss or weight gain Eyes: Negative for eye pain, redness and discharge, diplopia, visual changes, or flashes of light. ENMT: Negative for ear pain, hoarseness, nasal congestion, sinus pressure and sore throat. No headaches; tinnitus, drooling, or problem swallowing. Cardiovascular: Negative for chest pain, palpitations, diaphoresis, dyspnea and peripheral edema. ; No orthopnea, PND Respiratory: Negative for cough, hemoptysis, wheezing and stridor. No pleuritic chestpain. Gastrointestinal: Negative for nausea, vomiting, diarrhea, constipation, abdominal pain, melena, blood in stool, hematemesis, jaundice and rectal bleeding.    Genitourinary: Negative for frequency, dysuria, incontinence,flank pain and hematuria; Musculoskeletal: Negative for back pain and neck pain. Swelling bilaterally.   Skin: . She has erythema and a foot ulcer.   Neuro: Negative for headache, lightheadedness and neck stiffness. Negative for weakness, altered level of consciousness  , altered mental status, extremity weakness, burning feet, involuntary movement, seizure and syncope.  Psych: negative for anxiety, depression, insomnia, tearfulness, panic attacks, hallucinations, paranoia, suicidal or homicidal ideation    Past Medical History  Diagnosis Date  . Hypertension   . Diabetes mellitus without complication   . COPD (chronic obstructive pulmonary disease)   . CHF (congestive heart failure)     Diastolic  . Anxiety   . Chronic back pain   . Hypothyroidism   . Pedal edema   . On home O2     2.5 L N/C prn  . Degenerative disk disease   . Boil 01/10/2014    Past Surgical History  Procedure Laterality Date  . Tubal ligation    . Hernia repair      Medications:  HOME MEDS: Prior to Admission medications   Medication Sig Start Date End Date Taking? Authorizing Provider  ACCU-CHEK AVIVA PLUS test strip  12/29/13   Historical Provider, MD  ADACEL 09-10-13.5 LF-MCG/0.5 injection  12/27/13   Historical Provider, MD  albuterol (PROAIR HFA) 108 (90 BASE) MCG/ACT inhaler Inhale 1 puff into the lungs every 4 (four) hours as needed. For shortness of breath 05/30/13   UKinnie Feil MD  alprazolam (Duanne Moron 2 MG tablet Take 2 mg by mouth 4 (four) times daily.    Historical Provider, MD  aspirin EC 81 MG tablet Take 81 mg by mouth daily.    Historical Provider, MD  benzonatate (TESSALON) 100 MG capsule Take 1 capsule (100 mg total) by mouth 3 (three) times daily as needed for cough. 12/31/13   KJola Schmidt MD  Blood Glucose Monitoring Suppl (ACCU-CHEK  AVIVA PLUS) W/DEVICE KIT  11/07/13   Historical Provider, MD  diltiazem (CARDIZEM CD) 240 MG 24 hr capsule Take 1 capsule (240 mg total) by mouth daily. 05/30/13   Kinnie Feil, MD  docusate sodium 100 MG CAPS Take 100 mg by mouth 2 (two) times daily. 05/30/13   Kinnie Feil, MD  fluconazole (DIFLUCAN) 150 MG tablet Take 1 now and 1 in 3 days 01/10/14   Estill Dooms, NP  furosemide (LASIX) 20 MG tablet Take 3  tablets (60 mg total) by mouth 2 (two) times daily. 05/30/13   Kinnie Feil, MD  gabapentin (NEURONTIN) 600 MG tablet Take 1 tablet (600 mg total) by mouth 4 (four) times daily. 05/30/13   Kinnie Feil, MD  glipiZIDE-metformin (METAGLIP) 5-500 MG per tablet Take 1 tablet by mouth 2 (two) times daily before a meal. Takes 2 every am and 2 every pm    Historical Provider, MD  insulin aspart (NOVOLOG) 100 UNIT/ML injection Inject 0-20 Units into the skin 3 (three) times daily with meals. Takes 25 Units in am and 25 Units at night 05/30/13   Kinnie Feil, MD  insulin detemir (LEVEMIR) 100 UNIT/ML injection Inject 40 Units into the skin at bedtime. Takes 40 Units in am and 40 Units at night.    Historical Provider, MD  ipratropium-albuterol (DUONEB) 0.5-2.5 (3) MG/3ML SOLN Take 3 mLs by nebulization every 6 (six) hours. 05/30/13   Kinnie Feil, MD  levothyroxine (SYNTHROID, LEVOTHROID) 125 MCG tablet Take 2 tablets (250 mcg total) by mouth daily before breakfast. 05/30/13   Kinnie Feil, MD  meclizine (ANTIVERT) 25 MG tablet Take 25 mg by mouth 2 (two) times daily. 04/06/13   Historical Provider, MD  nicotine (NICODERM CQ - DOSED IN MG/24 HOURS) 21 mg/24hr patch Place 1 patch (21 mg total) onto the skin daily. 05/30/13   Kinnie Feil, MD  Omega-3 Fatty Acids (FISH OIL PO) Take 1 capsule by mouth 2 (two) times daily.     Historical Provider, MD  ondansetron (ZOFRAN ODT) 8 MG disintegrating tablet Take 1 tablet (8 mg total) by mouth every 8 (eight) hours as needed for nausea or vomiting. 12/31/13   Jola Schmidt, MD  oxyCODONE-acetaminophen (PERCOCET) 10-325 MG per tablet Take 1 tablet by mouth every 4 (four) hours as needed for pain.    Historical Provider, MD  pantoprazole (PROTONIX) 40 MG tablet Take 1 tablet (40 mg total) by mouth 2 (two) times daily. 04/21/13   Radene Gunning, NP  potassium chloride (K-DUR) 10 MEQ tablet Take 20 mEq by mouth daily.     Historical Provider, MD  predniSONE  (DELTASONE) 20 MG tablet Take 80 mg by mouth once. 12/04/13   Merryl Hacker, MD  sulfamethoxazole-trimethoprim (BACTRIM DS) 800-160 MG per tablet Take 1 tablet by mouth 2 (two) times daily. 01/10/14   Estill Dooms, NP     Allergies:  Allergies  Allergen Reactions  . Dilaudid [Hydromorphone Hcl]   . Oxycontin [Oxycodone]   . Reglan [Metoclopramide]   . Valium [Diazepam]   . Vistaril [Hydroxyzine Hcl]     Social History:   reports that she has been smoking Cigarettes.  She has been smoking about 0.25 packs per day. She has never used smokeless tobacco. She reports that she does not drink alcohol or use illicit drugs.  Family History: Family History  Problem Relation Age of Onset  . Stroke Mother   . Heart attack Father   .  Diabetes Brother   . Cerebral palsy Brother   . Pneumonia Brother   . Diabetes Other   . Heart attack Other      Physical Exam: Filed Vitals:   11/07/14 0124 11/07/14 0130  BP: 111/60 101/43  Pulse: 75 76  Resp: 21   Height: '5\' 6"'  (1.676 m)   Weight: 106.142 kg (234 lb)   SpO2: 100% 100%   Blood pressure 101/43, pulse 76, resp. rate 21, height '5\' 6"'  (1.676 m), weight 106.142 kg (234 lb), SpO2 100 %.  GEN:  Pleasant  patient lying in the stretcher in no acute distress; cooperative with exam. PSYCH:  alert and oriented x4; does not appear anxious or depressed; affect is appropriate. HEENT: Mucous membranes pink and anicteric; PERRLA; EOM intact; no cervical lymphadenopathy nor thyromegaly or carotid bruit; no JVD; There were no stridor. Neck is very supple. Breasts:: Not examined CHEST WALL: No tenderness CHEST: Normal respiration, bibasilar rales.  HEART: Regular rate and rhythm.  There are no murmur, rub, or gallops.   BACK: No kyphosis or scoliosis; no CVA tenderness ABDOMEN: soft and non-tender; no masses, no organomegaly, normal abdominal bowel sounds; no pannus; no intertriginous candida. There is no rebound and no distention. Rectal  Exam: Not done EXTREMITIES: No bone or joint deformity; age-appropriate arthropathy of the hands and knees; no edema; no ulcerations.  There is no calf tenderness. Genitalia: not examined PULSES: 2+ and symmetric SKIN: erythema and ulcer on right foot. This is suspicious for cellultis.  CNS: Cranial nerves 2-12 grossly intact no focal lateralizing neurologic deficit.  Speech is fluent; uvula elevated with phonation, facial symmetry and tongue midline. DTR are normal bilaterally, cerebella exam is intact, barbinski is negative and strengths are equaled bilaterally.  No sensory loss.   Labs on Admission:  Basic Metabolic Panel:  Recent Labs Lab 11/07/14 0235  NA 135  K 4.0  CL 98*  CO2 29  GLUCOSE 271*  BUN 30*  CREATININE 1.33*  CALCIUM 8.6*   CBC:  Recent Labs Lab 11/07/14 0235  WBC 8.2  NEUTROABS 5.8  HGB 11.8*  HCT 39.7  MCV 87.4  PLT 290   Cardiac Enzymes:  Recent Labs Lab 11/07/14 0235  TROPONINI <0.03    CBG: No results for input(s): GLUCAP in the last 168 hours.   Radiological Exams on Admission: Dg Chest Portable 1 View  11/07/2014   CLINICAL DATA:  Dyspnea for 2 weeks.  EXAM: PORTABLE CHEST - 1 VIEW  COMPARISON:  07/19/2014  FINDINGS: A single AP portable view of the chest demonstrates no focal airspace consolidation or alveolar edema. The lungs are grossly clear. There is no large effusion or pneumothorax. Cardiac and mediastinal contours appear unremarkable.  IMPRESSION: No active disease.   Electronically Signed   By: Andreas Newport M.D.   On: 11/07/2014 02:57    Assessment/Plan Present on Admission:  . Acute on chronic diastolic congestive heart failure . CKD (chronic kidney disease), stage III . COPD (chronic obstructive pulmonary disease) . Edema of lower extremity . Morbid obesity . Foot ulcer due to secondary DM . Diabetic ulcer of right foot  PLAN:  CHF:  Will admit her to telemetry and continue with IV diuresis.  She will be given 27m  IV Q 12.  Follow lytes, BUN and Cr daily.  For her COPD, will continue with her meds.  For her DM, will give ISS with resistant scale, place on carb modified diet.  For her ulcer, will start her on  IV vancomycin, and obtain xray to see if she has developed osteomyelitis.  She is stable, full code, and will be admitted to telemetry.  Thank you and have a great day.   Other plans as per orders.  Code Status: FULL Haskel Khan, MD. [facility] Pager [phone] 7pm to 7am.  11/07/2014, 5:14 AM

## 2014-11-07 NOTE — ED Notes (Signed)
Pt c/o sob x 2 weeks. Pt blood sugar was 238 and pt was given albuterol '5mg'$  and '125mg'$  of solumedrol enroute to facility.

## 2014-11-07 NOTE — Progress Notes (Signed)
UR chart review completed.  

## 2014-11-07 NOTE — Progress Notes (Signed)
ANTIBIOTIC CONSULT NOTE - INITIAL  Pharmacy Consult for Vancomycin Indication: cellulitis  Allergies  Allergen Reactions  . Dilaudid [Hydromorphone Hcl] Itching  . Oxycontin [Oxycodone] Other (See Comments)    "watery bowels"  . Reglan [Metoclopramide] Itching  . Valium [Diazepam] Itching  . Vistaril [Hydroxyzine Hcl] Itching   Patient Measurements: Height: '5\' 6"'$  (167.6 cm) Weight: 234 lb 12.6 oz (106.5 kg) IBW/kg (Calculated) : 59.3  Vital Signs: Temp: 98 F (36.7 C) (06/28 0629) Temp Source: Oral (06/28 0629) BP: 119/60 mmHg (06/28 1035) Pulse Rate: 75 (06/28 1035) Intake/Output from previous day:   Intake/Output from this shift:    Labs:  Recent Labs  11/07/14 0235  WBC 8.2  HGB 11.8*  PLT 290  CREATININE 1.33*   Estimated Creatinine Clearance: 52.8 mL/min (by C-G formula based on Cr of 1.33). No results for input(s): VANCOTROUGH, VANCOPEAK, VANCORANDOM, GENTTROUGH, GENTPEAK, GENTRANDOM, TOBRATROUGH, TOBRAPEAK, TOBRARND, AMIKACINPEAK, AMIKACINTROU, AMIKACIN in the last 72 hours.   Microbiology: No results found for this or any previous visit (from the past 720 hour(s)).  Medical History: Past Medical History  Diagnosis Date  . Hypertension   . Diabetes mellitus without complication   . COPD (chronic obstructive pulmonary disease)   . CHF (congestive heart failure)     Diastolic  . Anxiety   . Chronic back pain   . Hypothyroidism   . Pedal edema   . On home O2     2.5 L N/C prn  . Degenerative disk disease   . Boil 01/10/2014   Vancomycin 6/28 >>  Assessment: 64yo female with right foot ulcer.  Asked to initiate Vancomycin for cellulitis.  Pt is obese and SCr elevated.    Goal of Therapy:  Vancomycin trough level ~ 15  Plan:  Vancomycin '2000mg'$  IV today x 1 then Vancomycin '1250mg'$  IV q24hrs starting tomorrow. Check trough at steady state Monitor labs, renal fxn, and c/s  [place] Crane, Edgardo Petrenko A 11/07/2014,10:41 AM

## 2014-11-07 NOTE — Progress Notes (Signed)
Patient is a 64 yo with a hx COPD, DM, right foot ulcer, diastolic heart failure who was admitted this morning by Dr. Marin Comment for swelling of his legs. She was briefly seen and examined. Her chart, vital signs, and laboratory studies were reviewed. Agree with current assessment and plan with additions below.  -The patient's have been elevated ranging in the upper 400-500s. Therefore, will bolus her with normal saline and start her on maintenance IV fluids, despite her lower extremity edema. We'll add SCDs which may help with mobilizing the leg edema. Her chest x-ray was clear on admission.  -We will change sliding scale NovoLog to resistance scale every 4 hours and add 3 times a day mealtime coverage. Will change Levemir to twice a day rather than at bedtime. She was given one 10 unit dose of NPH.  -We will order hemoglobin A1c.  -The patient received 80 mg of Lasix in the ED early this morning. Will decrease Lasix to 20 mg IV every 12 hours. -2-D echocardiogram is pending.  -Decrease diltiazem to 180 mg due to soft blood pressures.  -TSH is within normal limits.  -X-ray of the right foot great toe revealed soft tissue ulceration with several punctate densities; no radiographic evidence of osteomyelitis; diffuse soft tissue swelling. Continue vancomycin.

## 2014-11-07 NOTE — Progress Notes (Signed)
*  PRELIMINARY RESULTS* Echocardiogram 2D Echocardiogram has been performed.  Leavy Cella 11/07/2014, 4:00 PM

## 2014-11-08 DIAGNOSIS — E08621 Diabetes mellitus due to underlying condition with foot ulcer: Secondary | ICD-10-CM

## 2014-11-08 DIAGNOSIS — J449 Chronic obstructive pulmonary disease, unspecified: Secondary | ICD-10-CM

## 2014-11-08 DIAGNOSIS — L97519 Non-pressure chronic ulcer of other part of right foot with unspecified severity: Secondary | ICD-10-CM

## 2014-11-08 DIAGNOSIS — I5033 Acute on chronic diastolic (congestive) heart failure: Principal | ICD-10-CM

## 2014-11-08 DIAGNOSIS — N183 Chronic kidney disease, stage 3 (moderate): Secondary | ICD-10-CM

## 2014-11-08 DIAGNOSIS — R600 Localized edema: Secondary | ICD-10-CM

## 2014-11-08 LAB — GLUCOSE, CAPILLARY
Glucose-Capillary: 214 mg/dL — ABNORMAL HIGH (ref 65–99)
Glucose-Capillary: 216 mg/dL — ABNORMAL HIGH (ref 65–99)
Glucose-Capillary: 276 mg/dL — ABNORMAL HIGH (ref 65–99)
Glucose-Capillary: 285 mg/dL — ABNORMAL HIGH (ref 65–99)
Glucose-Capillary: 53 mg/dL — ABNORMAL LOW (ref 65–99)
Glucose-Capillary: 84 mg/dL (ref 65–99)

## 2014-11-08 LAB — BASIC METABOLIC PANEL
Anion gap: 6 (ref 5–15)
BUN: 30 mg/dL — ABNORMAL HIGH (ref 6–20)
CO2: 29 mmol/L (ref 22–32)
Calcium: 8.1 mg/dL — ABNORMAL LOW (ref 8.9–10.3)
Chloride: 101 mmol/L (ref 101–111)
Creatinine, Ser: 1.19 mg/dL — ABNORMAL HIGH (ref 0.44–1.00)
GFR calc Af Amer: 55 mL/min — ABNORMAL LOW (ref >60)
GFR calc non Af Amer: 47 mL/min — ABNORMAL LOW (ref >60)
Glucose, Bld: 225 mg/dL — ABNORMAL HIGH (ref 65–99)
Potassium: 4.4 mmol/L (ref 3.5–5.1)
Sodium: 136 mmol/L (ref 135–145)

## 2014-11-08 LAB — CBC
HCT: 33.4 % — ABNORMAL LOW (ref 36.0–46.0)
Hemoglobin: 10.1 g/dL — ABNORMAL LOW (ref 12.0–15.0)
MCH: 25.7 pg — ABNORMAL LOW (ref 26.0–34.0)
MCHC: 30.2 g/dL (ref 30.0–36.0)
MCV: 85 fL (ref 78.0–100.0)
Platelets: 260 10*3/uL (ref 150–400)
RBC: 3.93 MIL/uL (ref 3.87–5.11)
RDW: 16.1 % — ABNORMAL HIGH (ref 11.5–15.5)
WBC: 8.2 10*3/uL (ref 4.0–10.5)

## 2014-11-08 MED ORDER — INSULIN ASPART 100 UNIT/ML ~~LOC~~ SOLN
6.0000 [IU] | Freq: Three times a day (TID) | SUBCUTANEOUS | Status: DC
  Administered 2014-11-09 (×3): 6 [IU] via SUBCUTANEOUS

## 2014-11-08 MED ORDER — ALPRAZOLAM 1 MG PO TABS
1.0000 mg | ORAL_TABLET | Freq: Once | ORAL | Status: AC
  Administered 2014-11-08: 1 mg via ORAL
  Filled 2014-11-08: qty 1

## 2014-11-08 MED ORDER — MILK AND MOLASSES ENEMA
1.0000 | Freq: Once | RECTAL | Status: AC
Start: 2014-11-08 — End: 2014-11-08
  Administered 2014-11-08: 250 mL via RECTAL

## 2014-11-08 MED ORDER — FUROSEMIDE 10 MG/ML IJ SOLN
40.0000 mg | Freq: Two times a day (BID) | INTRAMUSCULAR | Status: DC
  Administered 2014-11-08 – 2014-11-10 (×4): 40 mg via INTRAVENOUS
  Filled 2014-11-08 (×5): qty 4

## 2014-11-08 NOTE — Progress Notes (Signed)
[facility] PROGRESS NOTE  Stephanie Combs WUJ:[id number] DOB: 1950-07-08 DOA: 11/07/2014 PCP: Neale Burly, MD  Assessment/Plan: 1. Acute on chronic diastolic congestive heart failure. Continue with intravenous Lasix. Lower extremity edema appears to be improving. 2. Diabetic foot ulcer, right great toe. Plain films did not show any evidence of osteomyelitis. Check MRI of her foot since her wound has been present for several weeks now and is foul-smelling. Continue antibiotic coverage of vancomycin. Check arterial Dopplers. 3. COPD and chronic respiratory failure . appears stable. Continue bronchodilators 4. Diabetes. She is on Levemir, NovoLog sliding scale as well as meal coverage. Since she had an episode of hypoglycemia, will decrease her meal coverage. Hemoglobin A1c has been ordered. 5. Hypothyroidism. Continue Synthroid. TSH is normal. 6. Anxiety. Continue Xanax.  Code Status: Full code Family Communication: Discussed with patient Disposition Plan: Discharge home once improved   Consultants:    Procedures:    Antibiotics:  Vancomycin 6/29  HPI/Subjective: Patient is concerned that her blood pressure appears to be lower than her baseline. She did have a hypoglycemic episode today. Reports that her feet have been numb for quite some time now.  Objective: Filed Vitals:   11/08/14 1504  BP: 119/51  Pulse: 60  Temp: 97.7 F (36.5 C)  Resp: 20    Intake/Output Summary (Last 24 hours) at 11/08/14 1922 Last data filed at 11/08/14 1635  Gross per 24 hour  Intake 2209.58 ml  Output   4700 ml  Net -2490.42 ml   Filed Weights   11/07/14 0124 11/07/14 0629  Weight: 106.142 kg (234 lb) 106.5 kg (234 lb 12.6 oz)    Exam:   General:  NAD  Cardiovascular: S1, S2 RRR  Respiratory: CTA B  Abdomen: soft, nt, nd, bs+  Musculoskeletal: 1+ edema b/l. Right great toe ulceration without any visible discharge. There is foul smell from the wound.   Data  Reviewed: Basic Metabolic Panel:  Recent Labs Lab 11/07/14 0235 11/08/14 0638  NA 135 136  K 4.0 4.4  CL 98* 101  CO2 29 29  GLUCOSE 271* 225*  BUN 30* 30*  CREATININE 1.33* 1.19*  CALCIUM 8.6* 8.1*   Liver Function Tests: No results for input(s): AST, ALT, ALKPHOS, BILITOT, PROT, ALBUMIN in the last 168 hours. No results for input(s): LIPASE, AMYLASE in the last 168 hours. No results for input(s): AMMONIA in the last 168 hours. CBC:  Recent Labs Lab 11/07/14 0235 11/08/14 0638  WBC 8.2 8.2  NEUTROABS 5.8  --   HGB 11.8* 10.1*  HCT 39.7 33.4*  MCV 87.4 85.0  PLT 290 260   Cardiac Enzymes:  Recent Labs Lab 11/07/14 0235  TROPONINI <0.03   BNP (last 3 results)  Recent Labs  11/07/14 0235  BNP 102.0*    ProBNP (last 3 results)  Recent Labs  12/04/13 0100  PROBNP 273.5*    CBG:  Recent Labs Lab 11/08/14 0321 11/08/14 0736 11/08/14 1152 11/08/14 1601 11/08/14 1632  GLUCAP 276* 216* 214* 53* 84    No results found for this or any previous visit (from the past 240 hour(s)).   Studies: Dg Chest Portable 1 View  11/07/2014   CLINICAL DATA:  Dyspnea for 2 weeks.  EXAM: PORTABLE CHEST - 1 VIEW  COMPARISON:  07/19/2014  FINDINGS: A single AP portable view of the chest demonstrates no focal airspace consolidation or alveolar edema. The lungs are grossly clear. There is no large effusion or pneumothorax. Cardiac and mediastinal contours appear unremarkable.  IMPRESSION: No active disease.   Electronically Signed   By: Andreas Newport M.D.   On: 11/07/2014 02:57   Dg Foot 2 Views Right  11/07/2014   CLINICAL DATA:  Diabetic foot ulcer.  Right great toe wound.  EXAM: RIGHT FOOT - 2 VIEW  COMPARISON:  None.  FINDINGS: There is soft tissue ulceration along the dorsal aspect of the great toe with several punctate radiodensities along the margins of the ulcer. There is soft tissue swelling involving the great toe, with diffuse soft tissue swelling about the  foot and visualized lower leg as well. No dissecting soft tissue emphysema is identified. No acute fracture, dislocation, or osseous erosion is identified. The bones are diffusely osteopenic. Small plantar calcaneal enthesophyte is noted.  IMPRESSION: 1. Right great toe soft tissue ulceration with several punctate densities along its margin. Correlate for foreign bodies on the skin or in the ulcer. 2. No radiographic evidence of active osteomyelitis. 3. Diffuse soft tissue swelling.   Electronically Signed   By: Logan Bores   On: 11/07/2014 09:22    Scheduled Meds: . aspirin EC  81 mg Oral Daily  . diltiazem  180 mg Oral Daily  . docusate sodium  100 mg Oral BID  . furosemide  40 mg Intravenous Q12H  . gabapentin  600 mg Oral QID  . heparin  5,000 Units Subcutaneous 3 times per day  . insulin aspart  0-20 Units Subcutaneous 6 times per day  . insulin aspart  10 Units Subcutaneous TID WC  . insulin detemir  40 Units Subcutaneous BID  . ipratropium-albuterol  3 mL Nebulization Q6H  . levothyroxine  250 mcg Oral QAC breakfast  . nicotine  21 mg Transdermal Daily  . omega-3 acid ethyl esters  1 g Oral BID  . pantoprazole  40 mg Oral BID  . potassium chloride  20 mEq Oral Daily  . sodium chloride  3 mL Intravenous Q12H  . vancomycin  1,250 mg Intravenous Q24H   Continuous Infusions: . 0.9 % NaCl with KCl 20 mEq / L 10 mL/hr at 11/08/14 1811    Principal Problem:   Acute on chronic diastolic congestive heart failure Active Problems:   COPD (chronic obstructive pulmonary disease)   Morbid obesity   DM type 2 (diabetes mellitus, type 2)   CKD (chronic kidney disease), stage III   CHF (congestive heart failure)   Edema of lower extremity   Foot ulcer due to secondary DM   Diabetic ulcer of right foot    Time spent: 26mns    [name]  [facility] Pager [phone] If 7PM-7AM, please contact night-coverage at [email or web address], password [email or web address]6/29/2016, 7:22 PM  LOS: 1 day

## 2014-11-08 NOTE — Progress Notes (Signed)
Pt CBG 53 and asymptomatic. MD notified and made aware. Pt given 4 oz regular soda. Will recheck CBG and continue to monitor patient.

## 2014-11-08 NOTE — Care Management Note (Signed)
Case Management Note  Patient Details  Name: Stephanie Combs MRN: [id number] Date of Birth: April 24, 1951  Subjective/Objective:                  Pt admitted from home with CHF and wound infection. Pt lives alone and will return home at discharge. Pt is still driving and uses a walker for prn use. Pt has a daughter and neighbors who are very supportive and able to assist pt as needed.  Action/Plan: ? Need for [facility] RN at discharge for wound care. Will continue to follow for discharge planning needs.  Expected Discharge Date:                  Expected Discharge Plan:  Home/Self Care  In-House Referral:  NA  Discharge planning Services  CM Consult  Post Acute Care Choice:  NA Choice offered to:  NA  DME Arranged:    DME Agency:     HH Arranged:    HH Agency:     Status of Service:  Completed, signed off  Medicare Important Message Given:    Date Medicare IM Given:    Medicare IM give by:    Date Additional Medicare IM Given:    Additional Medicare Important Message give by:     If discussed at [place] of Stay Meetings, dates discussed:    Additional Comments:  Joylene Draft, RN 11/08/2014, 2:27 PM

## 2014-11-08 NOTE — Progress Notes (Signed)
Pt CBG rechecked and is 83.

## 2014-11-09 ENCOUNTER — Inpatient Hospital Stay (HOSPITAL_COMMUNITY): Payer: Medicare Other

## 2014-11-09 DIAGNOSIS — R600 Localized edema: Secondary | ICD-10-CM | POA: Insufficient documentation

## 2014-11-09 DIAGNOSIS — E11621 Type 2 diabetes mellitus with foot ulcer: Secondary | ICD-10-CM | POA: Insufficient documentation

## 2014-11-09 DIAGNOSIS — L97519 Non-pressure chronic ulcer of other part of right foot with unspecified severity: Secondary | ICD-10-CM

## 2014-11-09 LAB — GLUCOSE, CAPILLARY
Glucose-Capillary: 107 mg/dL — ABNORMAL HIGH (ref 65–99)
Glucose-Capillary: 136 mg/dL — ABNORMAL HIGH (ref 65–99)
Glucose-Capillary: 195 mg/dL — ABNORMAL HIGH (ref 65–99)
Glucose-Capillary: 203 mg/dL — ABNORMAL HIGH (ref 65–99)
Glucose-Capillary: 205 mg/dL — ABNORMAL HIGH (ref 65–99)
Glucose-Capillary: 223 mg/dL — ABNORMAL HIGH (ref 65–99)
Glucose-Capillary: 290 mg/dL — ABNORMAL HIGH (ref 65–99)

## 2014-11-09 LAB — BASIC METABOLIC PANEL
Anion gap: 8 (ref 5–15)
BUN: 32 mg/dL — ABNORMAL HIGH (ref 6–20)
CO2: 28 mmol/L (ref 22–32)
Calcium: 7.9 mg/dL — ABNORMAL LOW (ref 8.9–10.3)
Chloride: 100 mmol/L — ABNORMAL LOW (ref 101–111)
Creatinine, Ser: 1.16 mg/dL — ABNORMAL HIGH (ref 0.44–1.00)
GFR calc Af Amer: 56 mL/min — ABNORMAL LOW (ref >60)
GFR calc non Af Amer: 49 mL/min — ABNORMAL LOW (ref >60)
Glucose, Bld: 114 mg/dL — ABNORMAL HIGH (ref 65–99)
Potassium: 4.2 mmol/L (ref 3.5–5.1)
Sodium: 136 mmol/L (ref 135–145)

## 2014-11-09 LAB — HEMOGLOBIN A1C
Hgb A1c MFr Bld: 8.5 % — ABNORMAL HIGH (ref 4.8–5.6)
Mean Plasma Glucose: 197 mg/dL

## 2014-11-09 MED ORDER — LORAZEPAM 2 MG/ML IJ SOLN: 0.5000 mg | Freq: Once | INTRAMUSCULAR | Status: DC

## 2014-11-09 NOTE — Progress Notes (Signed)
[facility] PROGRESS NOTE  Stephanie Combs OIN:[id number] DOB: 1950/06/06 DOA: 11/07/2014 PCP: Neale Burly, MD  Assessment/Plan: 1. Acute on chronic diastolic congestive heart failure. Volume status -6.3L. Will continue with intravenous Lasix. Lower extremity edema continues to improve. Kidney function improving. monitor 2. Diabetic foot ulcer, right great toe. Plain films did not show any evidence of osteomyelitis. MRI worrisome for osteo vs reactive. Will request podiatry consult and wound care consult. Afebrile and non-toxic appearing. Continue vancomycin day #2.  Await dopplers.  3. COPD and chronic respiratory failure . Due to #1. remains stable. Continue bronchodilators 4. Diabetes. Episode of hypoglycemia yesterday. Insulin adjusted. Range today 107-290. Hemoglobin A1c 8.5. monitor 5. Hypothyroidism. Continue Synthroid. TSH is normal. 6. Anxiety. Continue Xanax.  Code Status: full Family Communication: none present Disposition Plan: home hopefully 24 hours   Consultants:  podiatry  Procedures:  echo 10/2014 Wall thickness was increased in a pattern of moderate LVH. Systolic function was vigorous. The estimated ejection fraction was in the range of 65% to 70%. Wall motion was normal; there were no regional wall motion abnormalities. Doppler parameters are consistent with abnormal left ventricular relaxation (grade 1 diastolic  Antibiotics:  Vancomycin 10/2014  HPI/Subjective: Awake. Reports feeling anxious and "scared".   Objective: Filed Vitals:   11/09/14 0839  BP: 135/55  Pulse:   Temp:   Resp:     Intake/Output Summary (Last 24 hours) at 11/09/14 1326 Last data filed at 11/09/14 0900  Gross per 24 hour  Intake    363 ml  Output   2700 ml  Net  -2337 ml   Filed Weights   11/07/14 0124 11/07/14 0629 11/09/14 0657  Weight: 106.142 kg (234 lb) 106.5 kg (234 lb 12.6 oz) 125.782 kg (277 lb 4.8 oz)    Exam:   General:  Obese appears  slightly lethargic  Cardiovascular: RRR trace -1+ LE edema  Respiratory: normal effort BS distant with fine crackles no wheeze  Abdomen: obese soft +BS non-tender to palpation  Musculoskeletal:  Right toe dressing dry and intact.   Data Reviewed: Basic Metabolic Panel:  Recent Labs Lab 11/07/14 0235 11/08/14 7096 11/09/14 0621  NA 135 136 136  K 4.0 4.4 4.2  CL 98* 101 100*  CO2 '29 29 28  '$ GLUCOSE 271* 225* 114*  BUN 30* 30* 32*  CREATININE 1.33* 1.19* 1.16*  CALCIUM 8.6* 8.1* 7.9*   Liver Function Tests: No results for input(s): AST, ALT, ALKPHOS, BILITOT, PROT, ALBUMIN in the last 168 hours. No results for input(s): LIPASE, AMYLASE in the last 168 hours. No results for input(s): AMMONIA in the last 168 hours. CBC:  Recent Labs Lab 11/07/14 0235 11/08/14 0638  WBC 8.2 8.2  NEUTROABS 5.8  --   HGB 11.8* 10.1*  HCT 39.7 33.4*  MCV 87.4 85.0  PLT 290 260   Cardiac Enzymes:  Recent Labs Lab 11/07/14 0235  TROPONINI <0.03   BNP (last 3 results)  Recent Labs  11/07/14 0235  BNP 102.0*    ProBNP (last 3 results)  Recent Labs  12/04/13 0100  PROBNP 273.5*    CBG:  Recent Labs Lab 11/08/14 2015 11/09/14 0017 11/09/14 0429 11/09/14 0745 11/09/14 1156  GLUCAP 223* 290* 107* 203* 136*    No results found for this or any previous visit (from the past 240 hour(s)).   Studies: Mr Foot Right Wo Contrast  11/09/2014   CLINICAL DATA:  Wound on the right great toe for several weeks. Diabetic patient. Initial encounter.  EXAM: MRI OF THE RIGHT FOREFOOT WITHOUT CONTRAST  TECHNIQUE: Multiplanar, multisequence MR imaging was performed. No intravenous contrast was administered.  COMPARISON:  Plain films of the right great toe 11/07/2014.  FINDINGS: There is intense subcutaneous edema over the dorsum of the foot. The appears to be a soft tissue wound along the medial aspect of the distal right great toe. No focal fluid collection is identified. Mildly  increased T2 signal is seen in the distal phalanx of the great toe. Bone marrow signal is otherwise unremarkable. All visualized tendons appear intact. Intrinsic musculature of the foot is atrophied. No mass identified.  IMPRESSION: Wound along the medial aspect of the right great toe. No abscess is identified. Mild marrow edema in the distal phalanx of the great toe is worrisome for osteomyelitis but could reflect reactive change.  Intense subcutaneous edema over the dorsum of the foot.  Atrophy of the intrinsic musculature of the foot.   Electronically Signed   By: Inge Rise M.D.   On: 11/09/2014 10:55    Scheduled Meds: . aspirin EC  81 mg Oral Daily  . diltiazem  180 mg Oral Daily  . docusate sodium  100 mg Oral BID  . furosemide  40 mg Intravenous Q12H  . gabapentin  600 mg Oral QID  . heparin  5,000 Units Subcutaneous 3 times per day  . insulin aspart  0-20 Units Subcutaneous 6 times per day  . insulin aspart  6 Units Subcutaneous TID WC  . insulin detemir  40 Units Subcutaneous BID  . ipratropium-albuterol  3 mL Nebulization Q6H  . levothyroxine  250 mcg Oral QAC breakfast  . LORazepam  0.5 mg Intravenous Once  . nicotine  21 mg Transdermal Daily  . omega-3 acid ethyl esters  1 g Oral BID  . pantoprazole  40 mg Oral BID  . potassium chloride  20 mEq Oral Daily  . sodium chloride  3 mL Intravenous Q12H  . vancomycin  1,250 mg Intravenous Q24H   Continuous Infusions: . 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  If 7PM-7AM, please contact night-coverage at [email or web address], password [facility] 11/09/2014, 1:26 PM  LOS: 2 days

## 2014-11-09 NOTE — Consult Note (Signed)
WOC wound consult note Reason for Consult: RGT ulcer, chronic.  Recently debrided.  MRI is worrisome for osteo. Surgical consult has been requested. Wound type:Neuropathic Pressure Ulcer POA: No Measurement:1cm x 0.8cm x 0.6cm ulcer in a callous measuring 2.5cm x 2cm. Ulcer was recently debrided to reveal this defect.  Wound has clean red wound bed, minimal exudate and no odor. Wound bed: As described above Drainage (amount, consistency, odor) As above Periwound: Intact with callous as described above.  Patient ambulates without protective foot wear and hygiene in the area is not optimal.  She requires education regarding diabetic foot care and is not receptive to my basic instructions this evening. Patient states she wishes to go home. She is unimpressed with a normal saline dressing and states she would prefer betadine for wound care.  I today have explained to her about using harsh antimicrobials in wound care with little impact.  She is polite, but not receptive to my instruction. Dressing procedure/placement/frequency: I will implement twice daily saline dressings and note that a surgical consult has been requested. If ongoing care is desired, referral to an outpatient wound care center could be considered.  Patient will require a wide dissection of this lesion and treatment for osteomyelitis for digit (and perhaps even forefoot salvage); she would benefit from antibiotic coverage for osteo and HHRN to oversee wound care and IV antibiotics. If you agree, please order. Further intervention is outside the scope [facility] nursing team will not follow, but will remain available to this patient, the nursing, surgical and medical teams.  Please re-consult if needed. Thanks, Maudie Flakes, MSN, RN, [place], [place], [place] [phone])

## 2014-11-10 LAB — GLUCOSE, CAPILLARY
Glucose-Capillary: 183 mg/dL — ABNORMAL HIGH (ref 65–99)
Glucose-Capillary: 99 mg/dL (ref 65–99)
Glucose-Capillary: 85 mg/dL (ref 65–99)
Glucose-Capillary: 156 mg/dL — ABNORMAL HIGH (ref 65–99)
Glucose-Capillary: 186 mg/dL — ABNORMAL HIGH (ref 65–99)

## 2014-11-10 LAB — BASIC METABOLIC PANEL
Anion gap: 9 (ref 5–15)
BUN: 30 mg/dL — ABNORMAL HIGH (ref 6–20)
CO2: 31 mmol/L (ref 22–32)
Calcium: 8.3 mg/dL — ABNORMAL LOW (ref 8.9–10.3)
Chloride: 96 mmol/L — ABNORMAL LOW (ref 101–111)
Creatinine, Ser: 1.18 mg/dL — ABNORMAL HIGH (ref 0.44–1.00)
GFR calc Af Amer: 55 mL/min — ABNORMAL LOW (ref >60)
GFR calc non Af Amer: 48 mL/min — ABNORMAL LOW (ref >60)
Glucose, Bld: 77 mg/dL (ref 65–99)
Potassium: 4.2 mmol/L (ref 3.5–5.1)
Sodium: 136 mmol/L (ref 135–145)

## 2014-11-10 LAB — VANCOMYCIN, TROUGH: Vancomycin Tr: 16 ug/mL (ref 10.0–20.0)

## 2014-11-10 MED ORDER — PANTOPRAZOLE SODIUM 40 MG PO TBEC: 40.0000 mg | DELAYED_RELEASE_TABLET | Freq: Two times a day (BID) | ORAL | Status: DC

## 2014-11-10 MED ORDER — LEVOFLOXACIN 750 MG PO TABS: 750.0000 mg | ORAL_TABLET | Freq: Every day | ORAL | Status: DC

## 2014-11-10 MED ORDER — ALPRAZOLAM 2 MG PO TABS: 2.0000 mg | ORAL_TABLET | Freq: Three times a day (TID) | ORAL | Status: DC | PRN

## 2014-11-10 MED ORDER — AMOXICILLIN-POT CLAVULANATE 875-125 MG PO TABS: 1.0000 | ORAL_TABLET | Freq: Two times a day (BID) | ORAL | Status: DC

## 2014-11-10 MED ORDER — AMOXICILLIN-POT CLAVULANATE 875-125 MG PO TABS
1.0000 | ORAL_TABLET | Freq: Two times a day (BID) | ORAL | Status: DC
Start: 2014-11-10 — End: 2014-11-10
  Administered 2014-11-10: 1 via ORAL
  Filled 2014-11-10: qty 1

## 2014-11-10 MED ORDER — FUROSEMIDE 20 MG PO TABS: 80.0000 mg | ORAL_TABLET | Freq: Two times a day (BID) | ORAL | Status: DC

## 2014-11-10 MED ORDER — OXYCODONE-ACETAMINOPHEN 10-325 MG PO TABS: 1.0000 | ORAL_TABLET | ORAL | Status: DC | PRN

## 2014-11-10 NOTE — Discharge Summary (Signed)
Physician Discharge Summary  Stephanie Combs URK:[id number] DOB: 03/26/1951 DOA: 11/07/2014  PCP: Stephanie Burly, MD  Admit date: 11/07/2014 Discharge date: 11/10/2014  Time spent: 40 minutes  Recommendations for Outpatient Follow-up:  1. Follow up with PCP evaluation of diabetic foot ulcer with likely osteo as well as volume status as lasix increased 2. 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 female with hx of COPD, DM with neuropathy, right foot ulcer, anxiety, diastolic CHF, hypothyroidism, lives at home alone, presented to the ER  On 11/07/14 with bilateral leg edema. She had swelling and pain so she couldnt walk. She recently had debridement of her right foot. She had no chest pain, orthopnea, PND, fever, chills, coughs or calf tenderness. Evaluation in the ER showed normal WBC, Hb 11.8 g per dL, normal K, and BNP of 102. EKG showed NSR with no acute ST T changes. CXR showed no active disease. She said her Lasix had not worked for her. In the ER, she was given '80mg'$  IV Lasix, and hospitalist was asked to admit her for acute on chronic CHF.    Hospital Course:  1. Acute on chronic diastolic congestive heart failure. Volume status  -9.2L. Will increase home lasix to 80 BID. Creatinine 1.19 on discharge down from 1.4 on admission. Close follow up with PCP for evaluation of volume status 2. Diabetic foot ulcer, right great toe. Plain films did not show any evidence of osteomyelitis. MRI worrisome for osteo vs reactive. Received vancomycin 3 days.  Evaluated by general surgery who opine no surgery needed at this time.  Patient not good candidate for picc line. Discussed with ID who recommended 1 months of levaquin and augmentin. She remained afebrile and non-toxic appearing. HHRN for dressing changes per [place] 3. COPD and chronic respiratory failure . Due to #1. remained stable.  4. Diabetes. Episode of hypoglycemia 11/07/14. Range at discharge 85-156. Will discontinue glipizide and metformin at discharge.  Hemoglobin A1c 8.5. Close OP follow uuo 5. Hypothyroidism. Continue Synthroid. TSH is normal. 6. Anxiety. Continue Xanax.   Procedures:none Consultations:  General surgery  Discharge Exam: Filed Vitals:   11/10/14 1306  BP: 128/40  Pulse: 78  Temp: 98.3 F (36.8 C)  Resp: 20    General: obese appears comfortable and slightly sleepy Cardiovascular: RRR no trace LE edema Respiratory: normal effort BS clear MS: right toe dressing dry and intact. No drainage or odor  Discharge Instructions    Current Discharge Medication List    START taking these medications   Details  amoxicillin-clavulanate (AUGMENTIN) 875-125 MG per tablet Take 1 tablet by mouth 2 (two) times daily. Qty: 60 tablet, Refills: 0  levofloxacin (LEVAQUIN) 750 MG tablet Take 1 tablet (750 mg total) by mouth daily. Qty: 30 tablet, Refills: 0      CONTINUE these medications which have CHANGED   Details  alprazolam (XANAX) 2 MG tablet Take 1 tablet (2 mg total) by mouth 3 (three) times daily as needed for anxiety.    furosemide (LASIX) 20 MG tablet Take 4 tablets (80 mg total) by mouth 2 (two) times daily. Qty: 180 tablet, Refills: 1     oxyCODONE-acetaminophen (PERCOCET) 10-325 MG per tablet Take 1 tablet by mouth every 4 (four) hours as needed for pain. Qty: 10 tablet, Refills: 0    pantoprazole (PROTONIX) 40 MG tablet Take 1 tablet (40 mg total) by mouth 2 (two) times daily. Qty: 60 tablet, Refills: 0      CONTINUE these medications which have NOT CHANGED   Details  albuterol (PROAIR HFA) 108 (90 BASE) MCG/ACT inhaler Inhale 1 puff into the lungs every 4 (four) hours as needed. For shortness of breath Qty: 1 Inhaler, Refills: 1    aspirin EC 81 MG tablet Take 81 mg by mouth daily.    benzonatate (TESSALON) 100 MG capsule Take 1 capsule (100 mg total) by mouth 3 (three) times daily as needed for cough. Qty: 21 capsule, Refills: 0    diltiazem (CARDIZEM CD) 240 MG 24 hr capsule Take 1 capsule (240 mg total) by mouth daily. Qty: 30 capsule, Refills: 2    diphenhydrAMINE (BENADRYL) 25 MG tablet Take 25 mg by mouth every 6 (six) hours as needed for allergies.    docusate sodium 100 MG CAPS Take 100 mg by mouth 2 (two) times daily. Qty: 10 capsule, Refills: 0    empagliflozin (JARDIANCE) 25 MG TABS tablet Take 25 mg by mouth daily.    gabapentin (NEURONTIN) 600 MG tablet Take 1 tablet (600 mg total) by mouth 4 (four) times daily. Qty: 60 tablet, Refills: 2    insulin aspart (NOVOLOG) 100 UNIT/ML injection Inject 0-20 Units into the skin 3 (three) times daily with meals. Takes 20 Units in am and 20 Units at night.  Per sliding scale    insulin detemir (LEVEMIR) 100 UNIT/ML injection Inject 40 Units into the skin 2 (two) times daily. Takes 40 Units in am and 40 Units at night.    ipratropium-albuterol (DUONEB) 0.5-2.5 (3) MG/3ML SOLN Take 3 mLs by nebulization every 6 (six) hours. Qty: 360 mL, Refills: 2    levothyroxine (SYNTHROID, LEVOTHROID) 125 MCG tablet Take 2 tablets (250 mcg total) by mouth daily before breakfast. Qty: 30 tablet, Refills: 2    meclizine (ANTIVERT) 25 MG tablet Take 25 mg by mouth 3  (three) times daily as needed for dizziness.     metolazone (ZAROXOLYN) 2.5 MG tablet Take 2.5 mg by mouth every other day.    naproxen sodium (ANAPROX) 220 MG tablet Take 220 mg by mouth daily as needed (headaches).    nicotine (NICODERM CQ - DOSED IN MG/24 HOURS) 21 mg/24hr patch Place 1 patch (21 mg total) onto the skin daily. Qty: 28 patch, Refills: 0    nystatin (MYCOSTATIN/NYSTOP) 100000 UNIT/GM POWD Apply 1 g topically 2 (two) times daily.    Omega-3 Fatty Acids (FISH OIL PO) Take 1 capsule by mouth 2 (two) times daily.     ondansetron (ZOFRAN ODT) 8 MG disintegrating tablet Take 1 tablet (8 mg total) by mouth every 8 (eight) hours as needed for nausea or vomiting. Qty: 10 tablet, Refills: 0    potassium chloride (  K-DUR) 10 MEQ tablet Take 20 mEq by mouth daily.     tiZANidine (ZANAFLEX) 4 MG tablet Take 4 mg by mouth 4 (four) times daily.    ADACEL 09-10-13.5 LF-MCG/0.5 injection Inject 0.5 mLs into the muscle once.       STOP taking these medications     glipiZIDE-metformin (METAGLIP) 5-500 MG per tablet        Allergies  Allergen Reactions  . Dilaudid [Hydromorphone Hcl] Itching  . Oxycontin [Oxycodone] Other (See Comments)    "watery bowels"  . Reglan [Metoclopramide] Itching  . Valium [Diazepam] Itching  . Vistaril [Hydroxyzine Hcl] Itching   Follow-up Information    Follow up with [facility] A, MD.   Specialty:  Internal Medicine   Contact information:   [address] [place] [phone]        The results of significant diagnostics from this hospitalization (including imaging, microbiology, ancillary and laboratory) are listed below for reference.    Significant Diagnostic Studies: Mr Foot Right Wo Contrast  11/09/2014   CLINICAL DATA:  Wound on the right great toe for several weeks. Diabetic patient. Initial encounter.  EXAM: MRI OF THE RIGHT FOREFOOT WITHOUT CONTRAST  TECHNIQUE: Multiplanar, multisequence MR imaging was performed.  No intravenous contrast was administered.  COMPARISON:  Plain films of the right great toe 11/07/2014.  FINDINGS: There is intense subcutaneous edema over the dorsum of the foot. The appears to be a soft tissue wound along the medial aspect of the distal right great toe. No focal fluid collection is identified. Mildly increased T2 signal is seen in the distal phalanx of the great toe. Bone marrow signal is otherwise unremarkable. All visualized tendons appear intact. Intrinsic musculature of the foot is atrophied. No mass identified.  IMPRESSION: Wound along the medial aspect of the right great toe. No abscess is identified. Mild marrow edema in the distal phalanx of the great toe is worrisome for osteomyelitis but could reflect reactive change.  Intense subcutaneous edema over the dorsum of the foot.  Atrophy of the intrinsic musculature of the foot.   Electronically Signed   By: Inge Rise M.D.   On: 11/09/2014 10:55   US Arterial Seg Multiple  11/09/2014   CLINICAL DATA:  Right great toe diabetic ulcer for 5 weeks. Peripheral vascular disease, leg pain concern for claudication.  EXAM: NONINVASIVE PHYSIOLOGIC VASCULAR STUDY OF BILATERAL LOWER EXTREMITIES  TECHNIQUE: Evaluation of both lower extremities was performed at rest, including calculation of ankle-brachial indices, multiple segmental pressure evaluation, segmental Doppler and segmental pulse volume recording.  COMPARISON:  None.  FINDINGS: Limited exam. Toe pressures were not obtainable. Patient refused pressure measurements in the thigh and popliteal regions.  Right ABI:  1.32  Left ABI:  1.21  Right Lower Extremity: Right lower thigh vessels are noncompressible compatible with peripheral atherosclerosis. Right posterior tibial tracing is triphasic. Right dorsalis pedis tracing is minimally dampened and biphasic. No significant waveform or pulse volume recording abnormality. Toe pressure is not obtained. Right ABI 1.32, likely falsely elevated  related to peripheral calcific atherosclerosis.  Left Lower Extremity: Triphasic left posterior tibial and dorsalis pedis tracings. No significant pressure gradient. Symmetric pulse from recordings.  IMPRESSION: ABIs greater than 1 bilaterally, suspect falsely elevated related to peripheral atherosclerosis. No significant posterior tibial or dorsalis pedis waveform abnormality at either ankle. Symmetric pulse volume recordings.   Electronically Signed   By: Jerilynn Mages.  Shick M.D.   On: 11/09/2014 15:18   Dg Chest Portable 1  View  11/07/2014   CLINICAL DATA:  Dyspnea for 2 weeks.  EXAM: PORTABLE CHEST - 1 VIEW  COMPARISON:  07/19/2014  FINDINGS: A single AP portable view of the chest demonstrates no focal airspace consolidation or alveolar edema. The lungs are grossly clear. There is no large effusion or pneumothorax. Cardiac and mediastinal contours appear unremarkable.  IMPRESSION: No active disease.   Electronically Signed   By: Andreas Newport M.D.   On: 11/07/2014 02:57   Dg Foot 2 Views Right  11/07/2014   CLINICAL DATA:  Diabetic foot ulcer.  Right great toe wound.  EXAM: RIGHT FOOT - 2 VIEW  COMPARISON:  None.  FINDINGS: There is soft tissue ulceration along the dorsal aspect of the great toe with several punctate radiodensities along the margins of the ulcer. There is soft tissue swelling involving the great toe, with diffuse soft tissue swelling about the foot and visualized lower leg as well. No dissecting soft tissue emphysema is identified. No acute fracture, dislocation, or osseous erosion is identified. The bones are diffusely osteopenic. Small plantar calcaneal enthesophyte is noted.  IMPRESSION: 1. Right great toe soft tissue ulceration with several punctate densities along its margin. Correlate for foreign bodies on the skin or in the ulcer. 2. No radiographic evidence of active osteomyelitis. 3. Diffuse soft tissue swelling.   Electronically Signed   By: Logan Bores   On: 11/07/2014 09:22     Microbiology: No results found for this or any previous visit (from the past 240 hour(s)).   Labs: Basic Metabolic Panel:  Recent Labs Lab 11/07/14 0235 11/08/14 5631 11/09/14 0621 11/10/14 0812  NA 135 136 136 136  K 4.0 4.4 4.2 4.2  CL 98* 101 100* 96*  CO2 '29 29 28 31  '$ GLUCOSE 271* 225* 114* 77  BUN 30* 30* 32* 30*  CREATININE 1.33* 1.19* 1.16* 1.18*  CALCIUM 8.6* 8.1* 7.9* 8.3*   Liver Function Tests: No results for input(s): AST, ALT, ALKPHOS, BILITOT, PROT, ALBUMIN in the last 168 hours. No results for input(s): LIPASE, AMYLASE in the last 168 hours. No results for input(s): AMMONIA in the last 168 hours. CBC:  Recent Labs Lab 11/07/14 0235 11/08/14 0638  WBC 8.2 8.2  NEUTROABS 5.8  --   HGB 11.8* 10.1*  HCT 39.7 33.4*  MCV 87.4 85.0  PLT 290 260   Cardiac Enzymes:  Recent Labs Lab 11/07/14 0235  TROPONINI <0.03   BNP: BNP (last 3 results)  Recent Labs  11/07/14 0235  BNP 102.0*    ProBNP (last 3 results)  Recent Labs  12/04/13 0100  PROBNP 273.5*    CBG:  Recent Labs Lab 11/09/14 2025 11/10/14 0052 11/10/14 0445 11/10/14 0803 11/10/14 1124  GLUCAP 195* 183* 99 85 156*       Signed:  BLACK,KAREN M  [facility] 11/10/2014, 3:03 PM

## 2014-11-10 NOTE — Consult Note (Signed)
Reason for Consult: Right great toe ulceration Referring Physician: Hospitalist  Stephanie Combs is an 64 y.o. female.  HPI: Patient is a 64 year old white female with multiple medical problems including diabetes mellitus with peripheral neuropathy who had a callus excised on the right great toe by Dr. Nils Pyle in the evening recently who now presented with right foot swelling and purulent drainage. MRI was performed which revealed possible osteomyelitis of the distal phalanx of the right great toe. Edematous soft tissue changes were also noted. She states that she has not had follow-up diabetic care.  Past Medical History  Diagnosis Date  . Hypertension   . Diabetes mellitus without complication   . COPD (chronic obstructive pulmonary disease)   . CHF (congestive heart failure)     Diastolic  . Anxiety   . Chronic back pain   . Hypothyroidism   . Pedal edema   . On home O2     2.5 L N/C prn  . Degenerative disk disease   . Boil 01/10/2014    Past Surgical History  Procedure Laterality Date  . Tubal ligation    . Hernia repair      Family History  Problem Relation Age of Onset  . Stroke Mother   . Heart attack Father   . Diabetes Brother   . Cerebral palsy Brother   . Pneumonia Brother   . Diabetes Other   . Heart attack Other     Social History:  reports that she has been smoking Cigarettes.  She has been smoking about 0.25 packs per day. She has never used smokeless tobacco. She reports that she does not drink alcohol or use illicit drugs.  Allergies:  Allergies  Allergen Reactions  . Dilaudid [Hydromorphone Hcl] Itching  . Oxycontin [Oxycodone] Other (See Comments)    "watery bowels"  . Reglan [Metoclopramide] Itching  . Valium [Diazepam] Itching  . Vistaril [Hydroxyzine Hcl] Itching    Medications: I have reviewed the patient's current medications.  Results for orders placed or performed during the hospital encounter of 11/07/14 (from the past 48 hour(s))   Glucose, capillary     Status: Abnormal   Collection Time: 11/08/14 11:52 AM  Result Value Ref Range   Glucose-Capillary 214 (H) 65 - 99 mg/dL   Comment 1 Notify RN   Glucose, capillary     Status: Abnormal   Collection Time: 11/08/14  4:01 PM  Result Value Ref Range   Glucose-Capillary 53 (L) 65 - 99 mg/dL   Comment 1 Notify RN   Glucose, capillary     Status: None   Collection Time: 11/08/14  4:32 PM  Result Value Ref Range   Glucose-Capillary 84 65 - 99 mg/dL   Comment 1 Notify RN   Glucose, capillary     Status: Abnormal   Collection Time: 11/08/14  8:15 PM  Result Value Ref Range   Glucose-Capillary 223 (H) 65 - 99 mg/dL   Comment 1 Notify RN   Glucose, capillary     Status: Abnormal   Collection Time: 11/09/14 12:17 AM  Result Value Ref Range   Glucose-Capillary 290 (H) 65 - 99 mg/dL   Comment 1 Notify RN   Glucose, capillary     Status: Abnormal   Collection Time: 11/09/14  4:29 AM  Result Value Ref Range   Glucose-Capillary 107 (H) 65 - 99 mg/dL   Comment 1 Notify RN   Basic metabolic panel     Status: Abnormal   Collection Time: 11/09/14  6:21 AM  Result Value Ref Range   Sodium 136 135 - 145 mmol/L   Potassium 4.2 3.5 - 5.1 mmol/L   Chloride 100 (L) 101 - 111 mmol/L   CO2 28 22 - 32 mmol/L   Glucose, Bld 114 (H) 65 - 99 mg/dL   BUN 32 (H) 6 - 20 mg/dL   Creatinine, Ser 1.16 (H) 0.44 - 1.00 mg/dL   Calcium 7.9 (L) 8.9 - 10.3 mg/dL   GFR calc non Af Amer 49 (L) >60 mL/min   GFR calc Af Amer 56 (L) >60 mL/min    Comment: (NOTE) The eGFR has been calculated using the CKD EPI equation. This calculation has not been validated in all clinical situations. eGFR's persistently <60 mL/min signify possible Chronic Kidney Disease.    Anion gap 8 5 - 15  Glucose, capillary     Status: Abnormal   Collection Time: 11/09/14  7:45 AM  Result Value Ref Range   Glucose-Capillary 203 (H) 65 - 99 mg/dL   Comment 1 Notify RN   Glucose, capillary     Status: Abnormal    Collection Time: 11/09/14 11:56 AM  Result Value Ref Range   Glucose-Capillary 136 (H) 65 - 99 mg/dL   Comment 1 Notify RN   Glucose, capillary     Status: Abnormal   Collection Time: 11/09/14  4:42 PM  Result Value Ref Range   Glucose-Capillary 205 (H) 65 - 99 mg/dL   Comment 1 Notify RN   Glucose, capillary     Status: Abnormal   Collection Time: 11/09/14  8:25 PM  Result Value Ref Range   Glucose-Capillary 195 (H) 65 - 99 mg/dL   Comment 1 Notify RN   Glucose, capillary     Status: Abnormal   Collection Time: 11/10/14 12:52 AM  Result Value Ref Range   Glucose-Capillary 183 (H) 65 - 99 mg/dL   Comment 1 Notify RN   Glucose, capillary     Status: None   Collection Time: 11/10/14  4:45 AM  Result Value Ref Range   Glucose-Capillary 99 65 - 99 mg/dL  Glucose, capillary     Status: None   Collection Time: 11/10/14  8:03 AM  Result Value Ref Range   Glucose-Capillary 85 65 - 99 mg/dL   Comment 1 Notify RN    Comment 2 Document in Chart   Basic metabolic panel     Status: Abnormal   Collection Time: 11/10/14  8:12 AM  Result Value Ref Range   Sodium 136 135 - 145 mmol/L   Potassium 4.2 3.5 - 5.1 mmol/L   Chloride 96 (L) 101 - 111 mmol/L   CO2 31 22 - 32 mmol/L   Glucose, Bld 77 65 - 99 mg/dL   BUN 30 (H) 6 - 20 mg/dL   Creatinine, Ser 1.18 (H) 0.44 - 1.00 mg/dL   Calcium 8.3 (L) 8.9 - 10.3 mg/dL   GFR calc non Af Amer 48 (L) >60 mL/min   GFR calc Af Amer 55 (L) >60 mL/min    Comment: (NOTE) The eGFR has been calculated using the CKD EPI equation. This calculation has not been validated in all clinical situations. eGFR's persistently <60 mL/min signify possible Chronic Kidney Disease.    Anion gap 9 5 - 15  Vancomycin, trough     Status: None   Collection Time: 11/10/14  8:13 AM  Result Value Ref Range   Vancomycin Tr 16 10.0 - 20.0 ug/mL    Mr Foot Right  Wo Contrast  11/09/2014   CLINICAL DATA:  Wound on the right great toe for several weeks. Diabetic patient.  Initial encounter.  EXAM: MRI OF THE RIGHT FOREFOOT WITHOUT CONTRAST  TECHNIQUE: Multiplanar, multisequence MR imaging was performed. No intravenous contrast was administered.  COMPARISON:  Plain films of the right great toe 11/07/2014.  FINDINGS: There is intense subcutaneous edema over the dorsum of the foot. The appears to be a soft tissue wound along the medial aspect of the distal right great toe. No focal fluid collection is identified. Mildly increased T2 signal is seen in the distal phalanx of the great toe. Bone marrow signal is otherwise unremarkable. All visualized tendons appear intact. Intrinsic musculature of the foot is atrophied. No mass identified.  IMPRESSION: Wound along the medial aspect of the right great toe. No abscess is identified. Mild marrow edema in the distal phalanx of the great toe is worrisome for osteomyelitis but could reflect reactive change.  Intense subcutaneous edema over the dorsum of the foot.  Atrophy of the intrinsic musculature of the foot.   Electronically Signed   By: Inge Rise M.D.   On: 11/09/2014 10:55   US Arterial Seg Multiple  11/09/2014   CLINICAL DATA:  Right great toe diabetic ulcer for 5 weeks. Peripheral vascular disease, leg pain concern for claudication.  EXAM: NONINVASIVE PHYSIOLOGIC VASCULAR STUDY OF BILATERAL LOWER EXTREMITIES  TECHNIQUE: Evaluation of both lower extremities was performed at rest, including calculation of ankle-brachial indices, multiple segmental pressure evaluation, segmental Doppler and segmental pulse volume recording.  COMPARISON:  None.  FINDINGS: Limited exam. Toe pressures were not obtainable. Patient refused pressure measurements in the thigh and popliteal regions.  Right ABI:  1.32  Left ABI:  1.21  Right Lower Extremity: Right lower thigh vessels are noncompressible compatible with peripheral atherosclerosis. Right posterior tibial tracing is triphasic. Right dorsalis pedis tracing is minimally dampened and biphasic.  No significant waveform or pulse volume recording abnormality. Toe pressure is not obtained. Right ABI 1.32, likely falsely elevated related to peripheral calcific atherosclerosis.  Left Lower Extremity: Triphasic left posterior tibial and dorsalis pedis tracings. No significant pressure gradient. Symmetric pulse from recordings.  IMPRESSION: ABIs greater than 1 bilaterally, suspect falsely elevated related to peripheral atherosclerosis. No significant posterior tibial or dorsalis pedis waveform abnormality at either ankle. Symmetric pulse volume recordings.   Electronically Signed   By: Jerilynn Mages.  Shick M.D.   On: 11/09/2014 15:18    ROS: See chart Blood pressure 123/58, pulse 68, temperature 98 F (36.7 C), temperature source Oral, resp. rate 20, height _0  (1.676 m), weight 125.329 kg (276 lb 4.8 oz), SpO2 98 %. Physical Exam: Pleasant morbidly obese white female in no acute distress. Difficult to palpate dorsalis pedis or posterior tibial pulses right foot. Mild swelling of the right foot as compared to the left foot. Decreased erythema is present. No ulceration is noted along the sole of the right great toe close to the metatarsal head. No purulent drainage was present. It does not appear to be any significant ischemic changes present right toe or foot this morning.  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 Plan: I do not think she needs amputation at this time. Patient is agreeable to IV antibiotic therapy for 6 weeks. She should also follow-up with podiatry as an outpatient for further management and treatment.  Gaelle Adriance A 11/10/2014, 9:31 AM

## 2014-11-10 NOTE — Progress Notes (Signed)
Peripheral IV and heart monitor removed. Wound dressing changed. Patient educated on how to change dressing. AVS summary given and explained to patient. Discussed heart failure management. Patient needs reinforcement in this area. Patient currently waiting for someone to pick her up.

## 2014-11-10 NOTE — Care Management (Signed)
Important Message  Patient Details  Name: Stephanie Combs MRN: [id number] Date of Birth: [birth year]   Medicare Important Message Given:  Yes-second notification given    Sherald Barge, RN 11/10/2014, 4:09 PM

## 2014-11-10 NOTE — Care Management Note (Signed)
Case Management Note  Patient Details  Name: Stephanie Combs MRN: [id number] Date of Birth: [birth year]   Expected Discharge Date:                  Expected Discharge Plan:  [place]  In-House Referral:  NA  Discharge planning Services  CM Consult  Post Acute Care Choice:  NA Choice offered to:  NA  DME Arranged:    DME Agency:     HH Arranged:  RN [place] Agency:  [place]  Status of Service:  Completed, signed off  Medicare Important Message Given:    Date Medicare IM Given:    Medicare IM give by:    Date Additional Medicare IM Given:    Additional Medicare Important Message give by:     If discussed at [place] of Stay Meetings, dates discussed:    Additional Comments: Pt discharging home today with [facility] services for wound care. Pt has requests [facility] for [facility] services. Romualdo Bolk of [facility] made aware of referral and will obtain pt info from chart. Pt aware HH has 48 hours to make first visit. No further CM needs.  Sherald Barge, RN 11/10/2014, 4:08 PM

## 2014-11-10 NOTE — Progress Notes (Signed)
ANTIBIOTIC CONSULT NOTE - follow up  Pharmacy Consult for Vancomycin Indication: cellulitis / osteomyelitis  Allergies  Allergen Reactions  . Dilaudid [Hydromorphone Hcl] Itching  . Oxycontin [Oxycodone] Other (See Comments)    "watery bowels"  . Reglan [Metoclopramide] Itching  . Valium [Diazepam] Itching  . Vistaril [Hydroxyzine Hcl] Itching   Patient Measurements: Height: '5\' 6"'$  (167.6 cm) Weight: 276 lb 4.8 oz (125.329 kg) (pt voided, telemetry off, weight accurate) IBW/kg (Calculated) : 59.3  Vital Signs: Temp: 98 F (36.7 C) (07/01 0441) Temp Source: Oral (07/01 0441) BP: 123/58 mmHg (07/01 0827) Pulse Rate: 68 (07/01 0822) Intake/Output from previous day: 06/30 0701 - 07/01 0700 In: 363 [P.O.:360; I.V.:3] Out: 3250 [Urine:3250] Intake/Output from this shift: Total I/O In: 720 [P.O.:720] Out: -   Labs:  Recent Labs  11/08/14 0638 11/09/14 0621 11/10/14 0812  WBC 8.2  --   --   HGB 10.1*  --   --   PLT 260  --   --   CREATININE 1.19* 1.16* 1.18*   Estimated Creatinine Clearance: 65.2 mL/min (by C-G formula based on Cr of 1.18).  Recent Labs  11/10/14 0813  [place] 55    Microbiology: No results found for this or any previous visit (from the past 720 hour(s)).  Medical History: Past Medical History  Diagnosis Date  . Hypertension   . Diabetes mellitus without complication   . COPD (chronic obstructive pulmonary disease)   . CHF (congestive heart failure)     Diastolic  . Anxiety   . Chronic back pain   . Hypothyroidism   . Pedal edema   . On home O2     2.5 L N/C prn  . Degenerative disk disease   . Boil 01/10/2014   Vancomycin 6/28 >>  Assessment: 64yo female with right foot ulcer.  Asked to initiate Vancomycin for cellulitis.  Pt is obese and SCr elevated.  MRI revealed possible osteomyelitis of the distal phalanx of the right great toe.  Trough level is on target.  Micro noncontributory.  Goal of Therapy:  Vancomycin trough level  ~ 15  Plan:  Continue Vancomycin '1250mg'$  IV q24hrs  Check trough level weekly or sooner if warranted Monitor SCr 3 times weekly Duration of therapy per MD  Hart Robinsons A 11/10/2014,10:29 AM

## 2014-11-24 ENCOUNTER — Encounter (HOSPITAL_COMMUNITY): Payer: Self-pay | Admitting: Emergency Medicine

## 2014-11-24 ENCOUNTER — Inpatient Hospital Stay (HOSPITAL_COMMUNITY)
Admission: EM | Admit: 2014-11-24 | Discharge: 2014-11-28 | DRG: 292 | Disposition: A | Discharge status: Home Health | Payer: Medicare Other | Attending: Internal Medicine | Admitting: Internal Medicine

## 2014-11-24 ENCOUNTER — Emergency Department (HOSPITAL_COMMUNITY): Payer: Medicare Other

## 2014-11-24 DIAGNOSIS — J441 Chronic obstructive pulmonary disease with (acute) exacerbation: Secondary | ICD-10-CM

## 2014-11-24 DIAGNOSIS — Z9981 Dependence on supplemental oxygen: Secondary | ICD-10-CM

## 2014-11-24 DIAGNOSIS — J449 Chronic obstructive pulmonary disease, unspecified: Secondary | ICD-10-CM | POA: Diagnosis not present

## 2014-11-24 DIAGNOSIS — Z794 Long term (current) use of insulin: Secondary | ICD-10-CM

## 2014-11-24 DIAGNOSIS — E039 Hypothyroidism, unspecified: Secondary | ICD-10-CM | POA: Diagnosis present

## 2014-11-24 DIAGNOSIS — F17210 Nicotine dependence, cigarettes, uncomplicated: Secondary | ICD-10-CM | POA: Diagnosis present

## 2014-11-24 DIAGNOSIS — R600 Localized edema: Secondary | ICD-10-CM | POA: Diagnosis present

## 2014-11-24 DIAGNOSIS — Z7982 Long term (current) use of aspirin: Secondary | ICD-10-CM

## 2014-11-24 DIAGNOSIS — Z6841 Body Mass Index (BMI) 40.0 and over, adult: Secondary | ICD-10-CM | POA: Diagnosis not present

## 2014-11-24 DIAGNOSIS — N179 Acute kidney failure, unspecified: Secondary | ICD-10-CM | POA: Diagnosis present

## 2014-11-24 DIAGNOSIS — Z833 Family history of diabetes mellitus: Secondary | ICD-10-CM

## 2014-11-24 DIAGNOSIS — E13621 Other specified diabetes mellitus with foot ulcer: Secondary | ICD-10-CM | POA: Diagnosis present

## 2014-11-24 DIAGNOSIS — E119 Type 2 diabetes mellitus without complications: Secondary | ICD-10-CM

## 2014-11-24 DIAGNOSIS — Z823 Family history of stroke: Secondary | ICD-10-CM | POA: Diagnosis not present

## 2014-11-24 DIAGNOSIS — I5033 Acute on chronic diastolic (congestive) heart failure: Principal | ICD-10-CM | POA: Diagnosis present

## 2014-11-24 DIAGNOSIS — Z8249 Family history of ischemic heart disease and other diseases of the circulatory system: Secondary | ICD-10-CM

## 2014-11-24 DIAGNOSIS — N289 Disorder of kidney and ureter, unspecified: Secondary | ICD-10-CM

## 2014-11-24 DIAGNOSIS — I129 Hypertensive chronic kidney disease with stage 1 through stage 4 chronic kidney disease, or unspecified chronic kidney disease: Secondary | ICD-10-CM | POA: Diagnosis present

## 2014-11-24 DIAGNOSIS — L0390 Cellulitis, unspecified: Secondary | ICD-10-CM | POA: Diagnosis present

## 2014-11-24 DIAGNOSIS — L97509 Non-pressure chronic ulcer of other part of unspecified foot with unspecified severity: Secondary | ICD-10-CM | POA: Diagnosis present

## 2014-11-24 DIAGNOSIS — E11621 Type 2 diabetes mellitus with foot ulcer: Secondary | ICD-10-CM | POA: Diagnosis present

## 2014-11-24 DIAGNOSIS — I10 Essential (primary) hypertension: Secondary | ICD-10-CM | POA: Diagnosis present

## 2014-11-24 DIAGNOSIS — L03116 Cellulitis of left lower limb: Secondary | ICD-10-CM | POA: Diagnosis present

## 2014-11-24 DIAGNOSIS — N183 Chronic kidney disease, stage 3 (moderate): Secondary | ICD-10-CM | POA: Diagnosis present

## 2014-11-24 DIAGNOSIS — E118 Type 2 diabetes mellitus with unspecified complications: Secondary | ICD-10-CM | POA: Diagnosis not present

## 2014-11-24 DIAGNOSIS — R0602 Shortness of breath: Secondary | ICD-10-CM | POA: Diagnosis not present

## 2014-11-24 DIAGNOSIS — R609 Edema, unspecified: Secondary | ICD-10-CM

## 2014-11-24 LAB — CBC WITH DIFFERENTIAL/PLATELET
Basophils Absolute: 0 10*3/uL (ref 0.0–0.1)
Basophils Relative: 1 % (ref 0–1)
Eosinophils Absolute: 0.7 10*3/uL (ref 0.0–0.7)
Eosinophils Relative: 9 % — ABNORMAL HIGH (ref 0–5)
HCT: 35.2 % — ABNORMAL LOW (ref 36.0–46.0)
Hemoglobin: 11 g/dL — ABNORMAL LOW (ref 12.0–15.0)
Lymphocytes Relative: 29 % (ref 12–46)
Lymphs Abs: 2.2 10*3/uL (ref 0.7–4.0)
MCH: 26.4 pg (ref 26.0–34.0)
MCHC: 31.3 g/dL (ref 30.0–36.0)
MCV: 84.6 fL (ref 78.0–100.0)
Monocytes Absolute: 0.6 10*3/uL (ref 0.1–1.0)
Monocytes Relative: 7 % (ref 3–12)
Neutro Abs: 4.1 10*3/uL (ref 1.7–7.7)
Neutrophils Relative %: 54 % (ref 43–77)
Platelets: 248 10*3/uL (ref 150–400)
RBC: 4.16 MIL/uL (ref 3.87–5.11)
RDW: 17.4 % — ABNORMAL HIGH (ref 11.5–15.5)
WBC: 7.7 10*3/uL (ref 4.0–10.5)

## 2014-11-24 LAB — BASIC METABOLIC PANEL
Anion gap: 11 (ref 5–15)
BUN: 37 mg/dL — ABNORMAL HIGH (ref 6–20)
CO2: 32 mmol/L (ref 22–32)
Calcium: 8.2 mg/dL — ABNORMAL LOW (ref 8.9–10.3)
Chloride: 93 mmol/L — ABNORMAL LOW (ref 101–111)
Creatinine, Ser: 1.96 mg/dL — ABNORMAL HIGH (ref 0.44–1.00)
GFR calc Af Amer: 30 mL/min — ABNORMAL LOW (ref >60)
GFR calc non Af Amer: 26 mL/min — ABNORMAL LOW (ref >60)
Glucose, Bld: 220 mg/dL — ABNORMAL HIGH (ref 65–99)
Potassium: 3.9 mmol/L (ref 3.5–5.1)
Sodium: 136 mmol/L (ref 135–145)

## 2014-11-24 LAB — GLUCOSE, CAPILLARY
Glucose-Capillary: 191 mg/dL — ABNORMAL HIGH (ref 65–99)
Glucose-Capillary: 372 mg/dL — ABNORMAL HIGH (ref 65–99)

## 2014-11-24 LAB — TROPONIN I
Troponin I: <0.03 ng/mL (ref <0.031)
Troponin I: <0.03 ng/mL (ref <0.031)

## 2014-11-24 LAB — CBG MONITORING, ED: Glucose-Capillary: 199 mg/dL — ABNORMAL HIGH (ref 65–99)

## 2014-11-24 LAB — BRAIN NATRIURETIC PEPTIDE: B Natriuretic Peptide: 50 pg/mL (ref 0.0–100.0)

## 2014-11-24 MED ORDER — DILTIAZEM HCL ER COATED BEADS 240 MG PO CP24
240.0000 mg | ORAL_CAPSULE | Freq: Every day | ORAL | Status: DC
  Administered 2014-11-24 – 2014-11-28 (×5): 240 mg via ORAL
  Filled 2014-11-24 (×5): qty 1

## 2014-11-24 MED ORDER — IPRATROPIUM-ALBUTEROL 0.5-2.5 (3) MG/3ML IN SOLN
3.0000 mL | Freq: Four times a day (QID) | RESPIRATORY_TRACT | Status: DC
  Administered 2014-11-25 – 2014-11-28 (×13): 3 mL via RESPIRATORY_TRACT
  Filled 2014-11-24 (×14): qty 3

## 2014-11-24 MED ORDER — ALBUTEROL SULFATE (2.5 MG/3ML) 0.083% IN NEBU: 2.5000 mg | INHALATION_SOLUTION | RESPIRATORY_TRACT | Status: DC | PRN

## 2014-11-24 MED ORDER — VANCOMYCIN HCL 10 G IV SOLR
1500.0000 mg | INTRAVENOUS | Status: DC
  Administered 2014-11-25 – 2014-11-26 (×2): 1500 mg via INTRAVENOUS
  Filled 2014-11-24 (×3): qty 1500

## 2014-11-24 MED ORDER — INSULIN ASPART 100 UNIT/ML ~~LOC~~ SOLN
0.0000 [IU] | Freq: Every day | SUBCUTANEOUS | Status: DC
  Administered 2014-11-24: 5 [IU] via SUBCUTANEOUS
  Administered 2014-11-25: 2 [IU] via SUBCUTANEOUS
  Administered 2014-11-26: 4 [IU] via SUBCUTANEOUS
  Administered 2014-11-27: 2 [IU] via SUBCUTANEOUS

## 2014-11-24 MED ORDER — ACETAMINOPHEN 325 MG PO TABS
650.0000 mg | ORAL_TABLET | Freq: Four times a day (QID) | ORAL | Status: DC | PRN
  Administered 2014-11-28 (×2): 650 mg via ORAL
  Filled 2014-11-24 (×2): qty 2

## 2014-11-24 MED ORDER — ALPRAZOLAM 1 MG PO TABS
2.0000 mg | ORAL_TABLET | Freq: Three times a day (TID) | ORAL | Status: DC | PRN
  Administered 2014-11-24 – 2014-11-27 (×6): 2 mg via ORAL
  Filled 2014-11-24 (×8): qty 2

## 2014-11-24 MED ORDER — FUROSEMIDE 10 MG/ML IJ SOLN
40.0000 mg | Freq: Once | INTRAMUSCULAR | Status: AC
  Administered 2014-11-24: 40 mg via INTRAVENOUS
  Filled 2014-11-24: qty 4

## 2014-11-24 MED ORDER — OXYCODONE HCL 5 MG PO TABS
5.0000 mg | ORAL_TABLET | ORAL | Status: DC | PRN
  Administered 2014-11-24 – 2014-11-26 (×5): 5 mg via ORAL
  Filled 2014-11-24 (×7): qty 1

## 2014-11-24 MED ORDER — NICOTINE 21 MG/24HR TD PT24
21.0000 mg | MEDICATED_PATCH | Freq: Every day | TRANSDERMAL | Status: DC
  Administered 2014-11-24 – 2014-11-28 (×5): 21 mg via TRANSDERMAL
  Filled 2014-11-24 (×5): qty 1

## 2014-11-24 MED ORDER — SODIUM CHLORIDE 0.9 % IJ SOLN
3.0000 mL | INTRAMUSCULAR | Status: DC | PRN
  Administered 2014-11-25 – 2014-11-26 (×2): 3 mL via INTRAVENOUS
  Filled 2014-11-24 (×2): qty 3

## 2014-11-24 MED ORDER — OXYCODONE-ACETAMINOPHEN 5-325 MG PO TABS
1.0000 | ORAL_TABLET | ORAL | Status: DC | PRN
  Administered 2014-11-24 – 2014-11-26 (×5): 1 via ORAL
  Filled 2014-11-24 (×8): qty 1

## 2014-11-24 MED ORDER — SODIUM CHLORIDE 0.9 % IJ SOLN
3.0000 mL | Freq: Two times a day (BID) | INTRAMUSCULAR | Status: DC
  Administered 2014-11-24 – 2014-11-28 (×8): 3 mL via INTRAVENOUS

## 2014-11-24 MED ORDER — ALBUTEROL SULFATE (2.5 MG/3ML) 0.083% IN NEBU
5.0000 mg | INHALATION_SOLUTION | Freq: Once | RESPIRATORY_TRACT | Status: AC
  Administered 2014-11-24: 5 mg via RESPIRATORY_TRACT
  Filled 2014-11-24: qty 6

## 2014-11-24 MED ORDER — INSULIN DETEMIR 100 UNIT/ML ~~LOC~~ SOLN
20.0000 [IU] | Freq: Every day | SUBCUTANEOUS | Status: DC
  Administered 2014-11-24: 20 [IU] via SUBCUTANEOUS
  Filled 2014-11-24 (×3): qty 0.2

## 2014-11-24 MED ORDER — OXYCODONE-ACETAMINOPHEN 10-325 MG PO TABS: 1.0000 | ORAL_TABLET | ORAL | Status: DC | PRN

## 2014-11-24 MED ORDER — LEVOFLOXACIN 500 MG PO TABS
500.0000 mg | ORAL_TABLET | ORAL | Status: DC
  Administered 2014-11-24 – 2014-11-26 (×2): 500 mg via ORAL
  Filled 2014-11-24 (×2): qty 1

## 2014-11-24 MED ORDER — VANCOMYCIN HCL 10 G IV SOLR
2000.0000 mg | Freq: Once | INTRAVENOUS | Status: AC
  Administered 2014-11-24: 2000 mg via INTRAVENOUS
  Filled 2014-11-24: qty 2000

## 2014-11-24 MED ORDER — TRAZODONE HCL 50 MG PO TABS: 25.0000 mg | ORAL_TABLET | Freq: Every evening | ORAL | Status: DC | PRN

## 2014-11-24 MED ORDER — MORPHINE SULFATE 2 MG/ML IJ SOLN
1.0000 mg | INTRAMUSCULAR | Status: DC | PRN
  Administered 2014-11-25: 1 mg via INTRAVENOUS
  Filled 2014-11-24: qty 1

## 2014-11-24 MED ORDER — POLYETHYLENE GLYCOL 3350 17 G PO PACK
17.0000 g | PACK | Freq: Every day | ORAL | Status: DC | PRN
  Administered 2014-11-25 – 2014-11-26 (×2): 17 g via ORAL
  Filled 2014-11-24 (×2): qty 1

## 2014-11-24 MED ORDER — FUROSEMIDE 10 MG/ML IJ SOLN
60.0000 mg | Freq: Two times a day (BID) | INTRAMUSCULAR | Status: AC
  Administered 2014-11-24 – 2014-11-26 (×5): 60 mg via INTRAVENOUS
  Filled 2014-11-24 (×5): qty 6

## 2014-11-24 MED ORDER — PANTOPRAZOLE SODIUM 40 MG PO TBEC
40.0000 mg | DELAYED_RELEASE_TABLET | Freq: Two times a day (BID) | ORAL | Status: DC
  Administered 2014-11-24 – 2014-11-28 (×8): 40 mg via ORAL
  Filled 2014-11-24 (×8): qty 1

## 2014-11-24 MED ORDER — ONDANSETRON HCL 4 MG PO TABS: 4.0000 mg | ORAL_TABLET | Freq: Four times a day (QID) | ORAL | Status: DC | PRN

## 2014-11-24 MED ORDER — ONDANSETRON HCL 4 MG/2ML IJ SOLN: 4.0000 mg | Freq: Four times a day (QID) | INTRAMUSCULAR | Status: DC | PRN

## 2014-11-24 MED ORDER — BENZONATATE 100 MG PO CAPS: 200.0000 mg | ORAL_CAPSULE | Freq: Two times a day (BID) | ORAL | Status: DC | PRN

## 2014-11-24 MED ORDER — LEVOTHYROXINE SODIUM 50 MCG PO TABS
250.0000 ug | ORAL_TABLET | Freq: Every day | ORAL | Status: DC
  Administered 2014-11-25 – 2014-11-28 (×4): 250 ug via ORAL
  Filled 2014-11-24: qty 1
  Filled 2014-11-24 (×3): qty 2
  Filled 2014-11-24: qty 1
  Filled 2014-11-24: qty 2
  Filled 2014-11-24 (×2): qty 1

## 2014-11-24 MED ORDER — ASPIRIN EC 81 MG PO TBEC
81.0000 mg | DELAYED_RELEASE_TABLET | Freq: Every day | ORAL | Status: DC
  Administered 2014-11-24 – 2014-11-28 (×5): 81 mg via ORAL
  Filled 2014-11-24 (×5): qty 1

## 2014-11-24 MED ORDER — HYDROCODONE-ACETAMINOPHEN 5-325 MG PO TABS
ORAL_TABLET | ORAL | Status: AC
  Filled 2014-11-24: qty 1

## 2014-11-24 MED ORDER — ALUM & MAG HYDROXIDE-SIMETH 200-200-20 MG/5ML PO SUSP: 30.0000 mL | Freq: Four times a day (QID) | ORAL | Status: DC | PRN

## 2014-11-24 MED ORDER — MECLIZINE HCL 12.5 MG PO TABS: 25.0000 mg | ORAL_TABLET | Freq: Three times a day (TID) | ORAL | Status: DC | PRN

## 2014-11-24 MED ORDER — HYDROCODONE-ACETAMINOPHEN 5-325 MG PO TABS
1.0000 | ORAL_TABLET | Freq: Once | ORAL | Status: AC
  Administered 2014-11-24: 1 via ORAL

## 2014-11-24 MED ORDER — TIZANIDINE HCL 4 MG PO TABS
4.0000 mg | ORAL_TABLET | Freq: Four times a day (QID) | ORAL | Status: DC
  Administered 2014-11-24 – 2014-11-28 (×16): 4 mg via ORAL
  Filled 2014-11-24 (×16): qty 1

## 2014-11-24 MED ORDER — POTASSIUM CHLORIDE CRYS ER 20 MEQ PO TBCR
20.0000 meq | EXTENDED_RELEASE_TABLET | Freq: Two times a day (BID) | ORAL | Status: DC
  Administered 2014-11-24 – 2014-11-28 (×8): 20 meq via ORAL
  Filled 2014-11-24 (×8): qty 1

## 2014-11-24 MED ORDER — IPRATROPIUM-ALBUTEROL 0.5-2.5 (3) MG/3ML IN SOLN
3.0000 mL | RESPIRATORY_TRACT | Status: DC
  Administered 2014-11-24: 3 mL via RESPIRATORY_TRACT
  Filled 2014-11-24: qty 3

## 2014-11-24 MED ORDER — IPRATROPIUM-ALBUTEROL 0.5-2.5 (3) MG/3ML IN SOLN
3.0000 mL | Freq: Once | RESPIRATORY_TRACT | Status: AC
  Administered 2014-11-24: 3 mL via RESPIRATORY_TRACT
  Filled 2014-11-24: qty 3

## 2014-11-24 MED ORDER — DIPHENHYDRAMINE HCL 25 MG PO CAPS: 25.0000 mg | ORAL_CAPSULE | Freq: Four times a day (QID) | ORAL | Status: DC | PRN

## 2014-11-24 MED ORDER — GABAPENTIN 400 MG PO CAPS
1200.0000 mg | ORAL_CAPSULE | Freq: Two times a day (BID) | ORAL | Status: DC
  Administered 2014-11-24 – 2014-11-28 (×8): 1200 mg via ORAL
  Filled 2014-11-24 (×8): qty 3

## 2014-11-24 MED ORDER — PREDNISONE 50 MG PO TABS
60.0000 mg | ORAL_TABLET | Freq: Once | ORAL | Status: AC
  Administered 2014-11-24: 60 mg via ORAL
  Filled 2014-11-24 (×2): qty 1

## 2014-11-24 MED ORDER — INSULIN ASPART 100 UNIT/ML ~~LOC~~ SOLN
0.0000 [IU] | Freq: Three times a day (TID) | SUBCUTANEOUS | Status: DC
  Administered 2014-11-24: 4 [IU] via SUBCUTANEOUS
  Administered 2014-11-25: 11 [IU] via SUBCUTANEOUS
  Administered 2014-11-25: 3 [IU] via SUBCUTANEOUS
  Administered 2014-11-25: 11 [IU] via SUBCUTANEOUS
  Administered 2014-11-26: 4 [IU] via SUBCUTANEOUS
  Administered 2014-11-26: 7 [IU] via SUBCUTANEOUS
  Administered 2014-11-26: 3 [IU] via SUBCUTANEOUS
  Administered 2014-11-27 (×2): 4 [IU] via SUBCUTANEOUS
  Administered 2014-11-27: 7 [IU] via SUBCUTANEOUS
  Administered 2014-11-28: 3 [IU] via SUBCUTANEOUS
  Administered 2014-11-28: 7 [IU] via SUBCUTANEOUS

## 2014-11-24 MED ORDER — FUROSEMIDE 10 MG/ML IJ SOLN
20.0000 mg | Freq: Once | INTRAMUSCULAR | Status: AC
  Administered 2014-11-24: 20 mg via INTRAVENOUS
  Filled 2014-11-24: qty 2

## 2014-11-24 MED ORDER — ALBUTEROL SULFATE HFA 108 (90 BASE) MCG/ACT IN AERS: 2.0000 | INHALATION_SPRAY | RESPIRATORY_TRACT | Status: DC | PRN

## 2014-11-24 MED ORDER — HEPARIN SODIUM (PORCINE) 5000 UNIT/ML IJ SOLN
5000.0000 [IU] | Freq: Three times a day (TID) | INTRAMUSCULAR | Status: DC
  Administered 2014-11-24 – 2014-11-28 (×13): 5000 [IU] via SUBCUTANEOUS
  Filled 2014-11-24 (×13): qty 1

## 2014-11-24 MED ORDER — SODIUM CHLORIDE 0.9 % IV SOLN: 250.0000 mL | INTRAVENOUS | Status: DC | PRN

## 2014-11-24 MED ORDER — ACETAMINOPHEN 650 MG RE SUPP: 650.0000 mg | Freq: Four times a day (QID) | RECTAL | Status: DC | PRN

## 2014-11-24 NOTE — Plan of Care (Signed)
Pt did choose to use security password. Please confirm password of anyone requesting information on her care.

## 2014-11-24 NOTE — Progress Notes (Signed)
ANTIBIOTIC CONSULT NOTE - INITIAL  Pharmacy Consult for Vancomycin Indication: cellulitis  Allergies  Allergen Reactions  . Dilaudid [Hydromorphone Hcl] Itching  . Actifed Cold-Allergy [Chlorpheniramine-Phenylephrine]     "I was sick and red and it didn't agree with me at all"  . Other Cough    Pt states she is allergic to ragweed and that she starts coughing and sneezing like crazy  . Penicillins Hives and Itching  . Reglan [Metoclopramide] Itching  . Valium [Diazepam] Itching  . Vistaril [Hydroxyzine Hcl] Itching    Patient Measurements: Height: '5\' 7"'$  (170.2 cm) Weight: 276 lb 3.2 oz (125.283 kg) IBW/kg (Calculated) : 61.6  Vital Signs: Temp: 98 F (36.7 C) (07/15 1612) Temp Source: Oral (07/15 1612) BP: 135/61 mmHg (07/15 1612) Pulse Rate: 75 (07/15 1612) Intake/Output from previous day:   Intake/Output from this shift: Total I/O In: -  Out: 500 [Urine:500]  Labs:  Recent Labs  11/24/14 0934  WBC 7.7  HGB 11.0*  PLT 248  CREATININE 1.96*   Estimated Creatinine Clearance: 39.9 mL/min (by C-G formula based on Cr of 1.96). No results for input(s): VANCOTROUGH, VANCOPEAK, VANCORANDOM, GENTTROUGH, GENTPEAK, GENTRANDOM, TOBRATROUGH, TOBRAPEAK, TOBRARND, AMIKACINPEAK, AMIKACINTROU, AMIKACIN in the last 72 hours.   Microbiology: No results found for this or any previous visit (from the past 720 hour(s)).  Medical History: Past Medical History  Diagnosis Date  . Hypertension   . Diabetes mellitus without complication   . COPD (chronic obstructive pulmonary disease)   . CHF (congestive heart failure)     Diastolic  . Anxiety   . Chronic back pain   . Hypothyroidism   . Pedal edema   . On home O2     2.5 L N/C prn  . Degenerative disk disease   . Boil 01/10/2014   Anti-infectives    Start     Dose/Rate Route Frequency Ordered Stop   11/25/14 1800  vancomycin (VANCOCIN) 1,500 mg in sodium chloride 0.9 % 500 mL IVPB     1,500 mg 250 mL/hr over 120 Minutes  Intravenous Every 24 hours 11/24/14 1643     11/24/14 1800  levofloxacin (LEVAQUIN) tablet 500 mg     500 mg Oral Every 48 hours 11/24/14 1618     11/24/14 1800  vancomycin (VANCOCIN) 2,000 mg in sodium chloride 0.9 % 500 mL IVPB     2,000 mg 250 mL/hr over 120 Minutes Intravenous  Once 11/24/14 1643       Assessment: 64yo female with worsening LE edema.  Asked to initiate Vancomycin.  SCr is elevated, Normalized clcr ~ 32-74m/min  Goal of Therapy:  Vancomycin trough level 10-15 mcg/ml  Plan:  Vancomycin '2000mg'$  IV now x 1 then Vancomycin '1500mg'$  IV q24hrs Check trough at steady state Monitor labs, renal fxn, and c/s  HHart RobinsonsA 11/24/2014,4:44 PM

## 2014-11-24 NOTE — ED Provider Notes (Signed)
CSN: [id number]     Arrival date & time 11/24/14  7829 History  This chart was scribed for Ripley Fraise, MD by Terressa Koyanagi, ED Scribe. This patient was seen in room APA10/APA10 and the patient's care was started at 9:02 AM.   Chief Complaint  Patient presents with  . Leg Swelling   The history is provided by the patient. No language interpreter was used.   PCP: Neale Burly, MD HPI Comments: Stephanie Combs is a 64 y.o. female, arriving via ambulance, with PMH noted below including daily tobacco use (0.25 ppd), COPD and pedal edema, on 2L/min O2 at home, who presents to the Emergency Department complaining of atraumatic, recurrent, worsening swelling to her legs bilaterally with associated SOB onset a few days ago. Pt also reports fever, mild nausea, and intermittent chest pain onset this morning. Pt reports she was recently admitted to [facility] for similar Sx. Pt takes  '80mg'$  of lasix daily.   Past Medical History  Diagnosis Date  . Hypertension   . Diabetes mellitus without complication   . COPD (chronic obstructive pulmonary disease)   . CHF (congestive heart failure)     Diastolic  . Anxiety   . Chronic back pain   . Hypothyroidism   . Pedal edema   . On home O2     2.5 L N/C prn  . Degenerative disk disease   . Boil 01/10/2014   Past Surgical History  Procedure Laterality Date  . Tubal ligation    . Hernia repair     Family History  Problem Relation Age of Onset  . Stroke Mother   . Heart attack Father   . Diabetes Brother   . Cerebral palsy Brother   . Pneumonia Brother   . Diabetes Other   . Heart attack Other    History  Substance Use Topics  . Smoking status: Current Every Day Smoker -- 0.25 packs/day    Types: Cigarettes  . Smokeless tobacco: Never Used  . Alcohol Use: No   OB History    No data available     Review of Systems  Constitutional: Positive for fever.  Respiratory: Positive for shortness of breath.   Cardiovascular: Positive for chest  pain and leg swelling.  Gastrointestinal: Positive for nausea.  All other systems reviewed and are negative.  Allergies  Dilaudid; Oxycontin; Reglan; Valium; and Vistaril  Home Medications   Prior to Admission medications   Medication Sig Start Date End Date Taking? Authorizing Provider  ADACEL 09-10-13.5 LF-MCG/0.5 injection Inject 0.5 mLs into the muscle once.  12/27/13   Historical Provider, MD  albuterol (PROAIR HFA) 108 (90 BASE) MCG/ACT inhaler Inhale 1 puff into the lungs every 4 (four) hours as needed. For shortness of breath 05/30/13   Kinnie Feil, MD  alprazolam Duanne Moron) 2 MG tablet Take 1 tablet (2 mg total) by mouth 3 (three) times daily as needed for anxiety. 11/10/14   Radene Gunning, NP  amoxicillin-clavulanate (AUGMENTIN) 875-125 MG per tablet Take 1 tablet by mouth 2 (two) times daily. 11/10/14   Radene Gunning, NP  aspirin EC 81 MG tablet Take 81 mg by mouth daily.    Historical Provider, MD  benzonatate (TESSALON) 100 MG capsule Take 1 capsule (100 mg total) by mouth 3 (three) times daily as needed for cough. Patient taking differently: Take 200 mg by mouth 2 (two) times daily as needed for cough.  12/31/13   Jola Schmidt, MD  diltiazem [facility] CD) 240  MG 24 hr capsule Take 1 capsule (240 mg total) by mouth daily. 05/30/13   Kinnie Feil, MD  diphenhydrAMINE (BENADRYL) 25 MG tablet Take 25 mg by mouth every 6 (six) hours as needed for allergies.    Historical Provider, MD  docusate sodium 100 MG CAPS Take 100 mg by mouth 2 (two) times daily. 05/30/13   Kinnie Feil, MD  empagliflozin (JARDIANCE) 25 MG TABS tablet Take 25 mg by mouth daily.    Historical Provider, MD  furosemide (LASIX) 20 MG tablet Take 4 tablets (80 mg total) by mouth 2 (two) times daily. 11/10/14   Radene Gunning, NP  gabapentin (NEURONTIN) 600 MG tablet Take 1 tablet (600 mg total) by mouth 4 (four) times daily. 05/30/13   Kinnie Feil, MD  insulin aspart (NOVOLOG) 100 UNIT/ML injection Inject 0-20  Units into the skin 3 (three) times daily with meals. Takes 20 Units in am and 20 Units at night.  Per sliding scale 05/30/13   Kinnie Feil, MD  insulin detemir (LEVEMIR) 100 UNIT/ML injection Inject 40 Units into the skin 2 (two) times daily. Takes 40 Units in am and 40 Units at night.    Historical Provider, MD  ipratropium-albuterol (DUONEB) 0.5-2.5 (3) MG/3ML SOLN Take 3 mLs by nebulization every 6 (six) hours. 05/30/13   Kinnie Feil, MD  levofloxacin (LEVAQUIN) 750 MG tablet Take 1 tablet (750 mg total) by mouth daily. 11/10/14   Radene Gunning, NP  levothyroxine (SYNTHROID, LEVOTHROID) 125 MCG tablet Take 2 tablets (250 mcg total) by mouth daily before breakfast. 05/30/13   Kinnie Feil, MD  meclizine (ANTIVERT) 25 MG tablet Take 25 mg by mouth 3 (three) times daily as needed for dizziness.  04/06/13   Historical Provider, MD  metolazone (ZAROXOLYN) 2.5 MG tablet Take 2.5 mg by mouth every other day.    Historical Provider, MD  nicotine (NICODERM CQ - DOSED IN MG/24 HOURS) 21 mg/24hr patch Place 1 patch (21 mg total) onto the skin daily. 05/30/13   Kinnie Feil, MD  nystatin (MYCOSTATIN/NYSTOP) 100000 UNIT/GM POWD Apply 1 g topically 2 (two) times daily.    Historical Provider, MD  Omega-3 Fatty Acids (FISH OIL PO) Take 1 capsule by mouth 2 (two) times daily.     Historical Provider, MD  ondansetron (ZOFRAN ODT) 8 MG disintegrating tablet Take 1 tablet (8 mg total) by mouth every 8 (eight) hours as needed for nausea or vomiting. 12/31/13   Jola Schmidt, MD  oxyCODONE-acetaminophen (PERCOCET) 10-325 MG per tablet Take 1 tablet by mouth every 4 (four) hours as needed for pain. 11/10/14   Radene Gunning, NP  pantoprazole (PROTONIX) 40 MG tablet Take 1 tablet (40 mg total) by mouth 2 (two) times daily. 11/10/14   Radene Gunning, NP  potassium chloride (K-DUR) 10 MEQ tablet Take 20 mEq by mouth daily.     Historical Provider, MD  tiZANidine (ZANAFLEX) 4 MG tablet Take 4 mg by mouth 4 (four) times  daily.    Historical Provider, MD   Triage Vitals: BP 114/53 mmHg  Pulse 84  Temp(Src) 98.4 F (36.9 C) (Oral)  Resp 23  Ht '5\' 2"'$  (1.575 m)  Wt 280 lb (127.007 kg)  BMI 51.20 kg/m2  SpO2 97% Physical Exam CONSTITUTIONAL: Well developed/well nourished HEAD: Normocephalic/atraumatic EYES: EOMI/PERRL ENMT: Mucous membranes moist NECK: supple no meningeal signs, positive JVD SPINE/BACK:entire spine nontender CV: S1/S2 noted, no murmurs/rubs/gallops noted LUNGS: wheezing bilaterally ABDOMEN: soft, nontender, no  rebound or guarding, bowel sounds noted throughout abdomen GU:no cva tenderness NEURO: Pt is awake/alert/appropriate, moves all extremitiesx4.  No facial droop.   EXTREMITIES: pulses normal/equal, full ROM, 2+ pitting edema BLE. Healing ulcer to right great toe.  SKIN: warm, color normal PSYCH: no abnormalities of mood noted, alert and oriented to situation  ED Course  Procedures  DIAGNOSTIC STUDIES: Oxygen Saturation is 97% on 4L/min.   COORDINATION OF CARE: 9:07 AM-Discussed treatment plan which includes breathing Tx, labs, imaging of lungs and possible hospital admittance with pt at bedside and pt agreed to plan.  1:19 PM Pt with continued sob/wheezing Will admit for further treatment/monitoring of COPD Pt agreeable D/w dr Cruzita Lederer will admit  Labs Review Labs Reviewed  BASIC METABOLIC PANEL - Abnormal; Notable for the following:    Chloride 93 (*)    Glucose, Bld 220 (*)    BUN 37 (*)    Creatinine, Ser 1.96 (*)    Calcium 8.2 (*)    GFR calc non Af Amer 26 (*)    GFR calc Af Amer 30 (*)    All other components within normal limits  CBC WITH DIFFERENTIAL/PLATELET - Abnormal; Notable for the following:    Hemoglobin 11.0 (*)    HCT 35.2 (*)    RDW 17.4 (*)    Eosinophils Relative 9 (*)    All other components within normal limits  CBG MONITORING, ED - Abnormal; Notable for the following:    Glucose-Capillary 199 (*)    All other components within normal  limits  TROPONIN I  BRAIN NATRIURETIC PEPTIDE    Imaging Review Dg Chest Portable 1 View  11/24/2014   CLINICAL DATA:  Shortness of breath.  Fluid retention.  EXAM: PORTABLE CHEST - 1 VIEW  COMPARISON:  One-view chest 11/18/2014.  FINDINGS: The heart is enlarged. The patient is rotated to the right. There is no significant edema or effusion to suggest failure. No focal airspace disease is evident.  IMPRESSION: Stable cardiomegaly without failure.   Electronically Signed   By: San Morelle M.D.   On: 11/24/2014 09:19     EKG Interpretation   Date/Time:  Friday November 24 2014 09:08:47 EDT Ventricular Rate:  82 PR Interval:  171 QRS Duration: 93 QT Interval:  401 QTC Calculation: 468 R Axis:   78 Text Interpretation:  Sinus rhythm Low voltage, precordial leads artifact  noted No significant change since last tracing Confirmed by Christy Gentles  MD,  Taz Vanness ([id number]) on 11/24/2014 9:20:02 AM     Medications  ipratropium-albuterol (DUONEB) 0.5-2.5 (3) MG/3ML nebulizer solution 3 mL (3 mLs Nebulization Given 11/24/14 1012)  HYDROcodone-acetaminophen (NORCO/VICODIN) 5-325 MG per tablet 1 tablet (1 tablet Oral Given 11/24/14 0920)  albuterol (PROVENTIL) (2.5 MG/3ML) 0.083% nebulizer solution 5 mg (5 mg Nebulization Given 11/24/14 1203)  predniSONE (DELTASONE) tablet 60 mg (60 mg Oral Given 11/24/14 1213)  furosemide (LASIX) injection 40 mg (40 mg Intravenous Given 11/24/14 1213)    MDM   Final diagnoses:  Renal insufficiency  Chronic obstructive pulmonary disease with acute exacerbation    Nursing notes including past medical history and social history reviewed and considered in documentation xrays/imaging reviewed by myself and considered during evaluation Labs/vital reviewed myself and considered during evaluation Previous records reviewed and considered    I personally performed the services described in this documentation, which was scribed in my presence. The recorded information  has been reviewed and is accurate.      Ripley Fraise, MD 11/24/14 1320

## 2014-11-24 NOTE — ED Notes (Addendum)
Pt reports Bilateral LE swelling x 2 days with redness to left LE that started yesterday. Pt reports she was recently in [facility] admitted for the same. Per EMS o2 sats were in the high 80's upon arrival to home. Pt placed on 2L o2. Pt also reports headache and increased SHOB. Pt reports she is on '80mg'$  lasix daily

## 2014-11-24 NOTE — Plan of Care (Signed)
Pt informed RN that she wanted to be a DNR. Pt stated "if something happens, I do not want to have CPR or to be put on any machines. Just let me go." Dr. Informed and stated he would prefer to talk with pt and family again before changing status.

## 2014-11-24 NOTE — H&P (Addendum)
[facility] History and Physical  Stephanie Combs KDT:[id number] DOB: 1951-01-25 DOA: 11/24/2014  Referring physician: Dr. Christy Gentles PCP: Neale Burly, MD   Chief Complaint: LE edema  HPI: Stephanie Combs is a 64 y.o. female with a past medical history that includes hypertension, diabetes, COPD on home oxygen, chronic diastolic heart failure, chronic pain, anxiety, lower extremity edema, presents to the emergency department with chief complaint of worsening lower extremity edema. Initial evaluation concerning for acute on chronic diastolic heart failure and acute on chronic kidney failure. Patient states she was discharged from the hospital July 1 and was doing well until about 3 days ago when she developed a gradual increase of her lower extremity edema. She reports compliance with her medication and her diet. Associated symptoms include shortness of breath worsening orthopnea, pain with ambulation nonproductive cough. She denies fever chills headache visual disturbances syncope or near-syncope. She denies abdominal pain nausea vomiting diarrhea constipation melanoma. She denies she really hematuria frequency or urgency. Does report that home health nursing has been coming to her house for dressing changes for diabetic wound on her right great toe. Workup in the emergency department significant for creatinine of 1.96 BUN 37 chloride 93 hemoglobin 11 serum glucose 220, proBNP 50, troponin negative. Chest X-ray yields stable cardiomegaly without failure. EKG with sinus rhythm no acute changes. She is hemodynamically stable afebrile oxygen saturation level 92% on 3 L. In the emergency department she is provided with albuterol nebulizer 40 mg of Lasix DuoNeb nebulizer and 60 mg prednisone  Review of Systems:  10 point review of systems complete and all systems are negative except as indicated in the history of present illness  Past Medical History  Diagnosis Date  . Hypertension   . Diabetes  mellitus without complication   . COPD (chronic obstructive pulmonary disease)   . CHF (congestive heart failure)     Diastolic  . Anxiety   . Chronic back pain   . Hypothyroidism   . Pedal edema   . On home O2     2.5 L N/C prn  . Degenerative disk disease   . Boil 01/10/2014   Past Surgical History  Procedure Laterality Date  . Tubal ligation    . Hernia repair     Social History:  reports that she has been smoking Cigarettes.  She has been smoking about 0.25 packs per day. She has never used smokeless tobacco. She reports that she does not drink alcohol or use illicit drugs. She lives at home alone she wears oxygen she smokes, fairly independent with ADLs Allergies  Allergen Reactions  . Dilaudid [Hydromorphone Hcl] Itching  . Actifed Cold-Allergy [Chlorpheniramine-Phenylephrine]     "I was sick and red and it didn't agree with me at all"  . Other Cough    Pt states she is allergic to ragweed and that she starts coughing and sneezing like crazy  . Penicillins Hives and Itching  . Reglan [Metoclopramide] Itching  . Valium [Diazepam] Itching  . Vistaril [Hydroxyzine Hcl] Itching   Family History  Problem Relation Age of Onset  . Stroke Mother   . Heart attack Father   . Diabetes Brother   . Cerebral palsy Brother   . Pneumonia Brother   . Diabetes Other   . Heart attack Other     Prior to Admission medications   Medication Sig Start Date End Date Taking? Authorizing Provider  albuterol (PROAIR HFA) 108 (90 BASE) MCG/ACT inhaler Inhale 1 puff into the  lungs every 4 (four) hours as needed. For shortness of breath Patient taking differently: Inhale 2 puffs into the lungs every 4 (four) hours as needed. For shortness of breath 05/30/13  Yes Kinnie Feil, MD  alprazolam Duanne Moron) 2 MG tablet Take 1 tablet (2 mg total) by mouth 3 (three) times daily as needed for anxiety. 11/10/14  Yes Radene Gunning, NP  aspirin EC 81 MG tablet Take 81 mg by mouth daily.   Yes Historical  Provider, MD  benzonatate (TESSALON) 100 MG capsule Take 1 capsule (100 mg total) by mouth 3 (three) times daily as needed for cough. Patient taking differently: Take 200 mg by mouth 2 (two) times daily as needed for cough.  12/31/13  Yes Jola Schmidt, MD  Bisacodyl (FLEET LAXATIVE RE) Place 1 application rectally daily as needed (constipation).   Yes Historical Provider, MD  diltiazem (CARDIZEM CD) 240 MG 24 hr capsule Take 1 capsule (240 mg total) by mouth daily. 05/30/13  Yes Kinnie Feil, MD  diphenhydrAMINE (BENADRYL) 25 MG tablet Take 25 mg by mouth every 6 (six) hours as needed for allergies.   Yes Historical Provider, MD  empagliflozin (JARDIANCE) 25 MG TABS tablet Take 25 mg by mouth daily.   Yes Historical Provider, MD  furosemide (LASIX) 20 MG tablet Take 4 tablets (80 mg total) by mouth 2 (two) times daily. 11/10/14  Yes Lezlie Octave Black, NP  gabapentin (NEURONTIN) 600 MG tablet Take 1 tablet (600 mg total) by mouth 4 (four) times daily. Patient taking differently: Take 1,200 mg by mouth 2 (two) times daily.  05/30/13  Yes Kinnie Feil, MD  insulin aspart (NOVOLOG) 100 UNIT/ML injection Inject 0-20 Units into the skin 3 (three) times daily with meals. Takes 20 Units in am and 20 Units at night.  Per sliding scale 05/30/13  Yes Kinnie Feil, MD  insulin detemir (LEVEMIR) 100 UNIT/ML injection Inject 40 Units into the skin 2 (two) times daily. Takes 40 Units in am and 40 Units at night.   Yes Historical Provider, MD  ipratropium-albuterol (DUONEB) 0.5-2.5 (3) MG/3ML SOLN Take 3 mLs by nebulization every 6 (six) hours. 05/30/13  Yes Kinnie Feil, MD  levothyroxine (SYNTHROID, LEVOTHROID) 125 MCG tablet Take 2 tablets (250 mcg total) by mouth daily before breakfast. 05/30/13  Yes Kinnie Feil, MD  meclizine (ANTIVERT) 25 MG tablet Take 25 mg by mouth 3 (three) times daily as needed for dizziness.  04/06/13  Yes Historical Provider, MD  metFORMIN (GLUCOPHAGE) 500 MG tablet Take 500 mg  by mouth 2 (two) times daily with a meal.   Yes Historical Provider, MD  naproxen sodium (ANAPROX) 220 MG tablet Take 440 mg by mouth 2 (two) times daily as needed (headache).   Yes Historical Provider, MD  nicotine (NICODERM CQ - DOSED IN MG/24 HOURS) 21 mg/24hr patch Place 1 patch (21 mg total) onto the skin daily. 05/30/13  Yes Kinnie Feil, MD  nystatin (MYCOSTATIN/NYSTOP) 100000 UNIT/GM POWD Apply 1 g topically 2 (two) times daily.   Yes Historical Provider, MD  Omega-3 Fatty Acids (FISH OIL PO) Take 1 capsule by mouth 2 (two) times daily.    Yes Historical Provider, MD  ondansetron (ZOFRAN ODT) 8 MG disintegrating tablet Take 1 tablet (8 mg total) by mouth every 8 (eight) hours as needed for nausea or vomiting. 12/31/13  Yes Jola Schmidt, MD  OVER THE COUNTER MEDICATION Apply 1 application topically daily.   Yes Historical Provider, MD  oxyCODONE-acetaminophen (  PERCOCET) 10-325 MG per tablet Take 1 tablet by mouth every 4 (four) hours as needed for pain. 11/10/14  Yes Lezlie Octave Black, NP  pantoprazole (PROTONIX) 40 MG tablet Take 1 tablet (40 mg total) by mouth 2 (two) times daily. 11/10/14  Yes Lezlie Octave Black, NP  potassium chloride (K-DUR) 10 MEQ tablet Take 20 mEq by mouth 2 (two) times daily.    Yes Historical Provider, MD  tiZANidine (ZANAFLEX) 4 MG tablet Take 4 mg by mouth 4 (four) times daily.   Yes Historical Provider, MD  amoxicillin-clavulanate (AUGMENTIN) 875-125 MG per tablet Take 1 tablet by mouth 2 (two) times daily. Patient not taking: Reported on 11/24/2014 11/10/14   Radene Gunning, NP  levofloxacin (LEVAQUIN) 750 MG tablet Take 1 tablet (750 mg total) by mouth daily. Patient not taking: Reported on 11/24/2014 11/10/14   Radene Gunning, NP   Physical Exam: Filed Vitals:   11/24/14 1000 11/24/14 1012 11/24/14 1130 11/24/14 1206  BP: 114/46  107/39 113/53  Pulse: 78  75 78  Temp:      TempSrc:      Resp: '26  24 19  '$ Height:      Weight:      SpO2: 100% 100% 97% 92%   Wt Readings  from Last 3 Encounters:  11/24/14 127.007 kg (280 lb)  11/10/14 125.329 kg (276 lb 4.8 oz)  11/09/14 106.142 kg (234 lb)   General:  Appears calm and comfortable Eyes: PERRL, normal lids, irises & conjunctiva ENT: grossly normal hearing, his membranes of her mouth are pink but dry Neck +jvd Cardiovascular: RRR, no m/r/g. 2-3+LE edema pitting. Respiratory: normal effort with conversation BS diminished mostly clear with fine crackles base Abdomen: soft, ntnd obese +BS Skin: left LE from top of ankle to bottom of knee with erythema, early blistering, heat and tenderness. Healing ulcer to right great toe.  Musculoskeletal: grossly normal tone BUE/BLE Psychiatric: grossly normal mood and affect, speech fluent and appropriate Neurologic: grossly non-focal. Speech clear          Labs on Admission:  Basic Metabolic Panel:  Recent Labs Lab 11/24/14 0934  NA 136  K 3.9  CL 93*  CO2 32  GLUCOSE 220*  BUN 37*  CREATININE 1.96*  CALCIUM 8.2*   CBC:  Recent Labs Lab 11/24/14 0934  WBC 7.7  NEUTROABS 4.1  HGB 11.0*  HCT 35.2*  MCV 84.6  PLT 248   Cardiac Enzymes:  Recent Labs Lab 11/24/14 0934  TROPONINI <0.03    BNP (last 3 results)  Recent Labs  11/07/14 0235 11/24/14 0933  BNP 102.0* 50.0    ProBNP (last 3 results)  Recent Labs  12/04/13 0100  PROBNP 273.5*    CBG:  Recent Labs Lab 11/24/14 0853  GLUCAP 199*   Radiological Exams on Admission: Dg Chest Portable 1 View  11/24/2014   CLINICAL DATA:  Shortness of breath.  Fluid retention.  EXAM: PORTABLE CHEST - 1 VIEW  COMPARISON:  One-view chest 11/18/2014.  FINDINGS: The heart is enlarged. The patient is rotated to the right. There is no significant edema or effusion to suggest failure. No focal airspace disease is evident.  IMPRESSION: Stable cardiomegaly without failure.   Electronically Signed   By: San Morelle M.D.   On: 11/24/2014 09:19   EKG: sinus rhythm. Independently  reviewed  Assessment/Plan  Acute on chronic diastolic HF (heart failure) - reports compliance with medication and diet. Echo done in June of this year reveals moderate  LVH EF of 65% with grade 1 diastolic dysfunction. She was recently discharged for same at that time her Lasix was increased. She does have chronic lower extremity edema but her legs this admission appear much more edematous than at discharge 3 weeks ago.  - Will admit to telemetry.  - Will provide IV Lasix 60 BID  - We'll continue her home Cardizem. Currently not on beta blocker.  - Monitor intake and output and obtain daily weights.  - Consider cardiology consult and/or referral to heart failure team given this is her second admission in a month.  Acute renal failure superimposed on stage 3 chronic kidney disease - Discharge her creatinine was 1.19. Today is 1.9. Likely related to above. Will provide IV Lasix. Monitor urine output. If no improvement consider renal ultrasound  Cellulitis - Left lower extremity with erythema early blistering. May be related to #1. She was discharged 3 weeks ago on Levaquin and Augmentin for a diabetic foot ulcer that is healing. Will hold this and ask pharmacy to dose vancomycin for now. She is afebrile and nontoxic appearing WBCs within the limits of normal - start Vancomycin and continue levaquin  COPD (chronic obstructive pulmonary disease) - Appears stable at baseline. She is on home oxygen. She doesn't always wear because she prefers to smoke. Will continue her home inhalers and nebulizers. She was given 60 mg of prednisone in the emergency department. Will hold off on this for now. Watch her closely, resume prednisone if she is wheezing in am.  Morbid obesity - BMI 51.3. Nutritional consult  DM type 2 (diabetes mellitus, type 2) - A1c 2 weeks ago was 8.5.  - I will continue her home Levemir at a lower dose as her appetite is somewhat unreliable.  - Will use sliding scale insulin for  optimal control.  HTN (hypertension), benign - Stable but slightly on the soft side. Will continue her home Cardizem. Will monitor  Edema of lower extremity - Acute on chronic. 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 Her case was discussed with infectious disease who recommended 1 month of Levaquin and Augmentin as she was not a good candidate for PICC line. This ulcer is healing nicely currently.  - will hold the Augmentin and Levaquin for now as starting antibiotics for cellulitis.  - Dressing changes per usual   Code Status: Full DVT Prophylaxis: heparin s.q. Family Communication: none present Disposition Plan: home when ready  [facility] Pager [phone]  Patient was seen, examined,treatment plan was discussed with the Advance Practice Provider.  I have directly reviewed the clinical findings, lab, imaging studies and management of this patient in detail. I have made the necessary changes to the above noted documentation, and agree with the documentation, as recorded by the Advance Practice Provider.  Time spent: 70 minutes  Marzetta Board, MD [facility] [phone]

## 2014-11-25 DIAGNOSIS — N179 Acute kidney failure, unspecified: Secondary | ICD-10-CM

## 2014-11-25 DIAGNOSIS — L03116 Cellulitis of left lower limb: Secondary | ICD-10-CM

## 2014-11-25 DIAGNOSIS — E118 Type 2 diabetes mellitus with unspecified complications: Secondary | ICD-10-CM

## 2014-11-25 DIAGNOSIS — J449 Chronic obstructive pulmonary disease, unspecified: Secondary | ICD-10-CM

## 2014-11-25 DIAGNOSIS — N183 Chronic kidney disease, stage 3 (moderate): Secondary | ICD-10-CM

## 2014-11-25 LAB — GLUCOSE, CAPILLARY
Glucose-Capillary: 300 mg/dL — ABNORMAL HIGH (ref 65–99)
Glucose-Capillary: 265 mg/dL — ABNORMAL HIGH (ref 65–99)
Glucose-Capillary: 264 mg/dL — ABNORMAL HIGH (ref 65–99)
Glucose-Capillary: 134 mg/dL — ABNORMAL HIGH (ref 65–99)
Glucose-Capillary: 226 mg/dL — ABNORMAL HIGH (ref 65–99)

## 2014-11-25 LAB — CBC
HCT: 35.4 % — ABNORMAL LOW (ref 36.0–46.0)
Hemoglobin: 11 g/dL — ABNORMAL LOW (ref 12.0–15.0)
MCH: 25.9 pg — ABNORMAL LOW (ref 26.0–34.0)
MCHC: 31.1 g/dL (ref 30.0–36.0)
MCV: 83.5 fL (ref 78.0–100.0)
Platelets: 288 10*3/uL (ref 150–400)
RBC: 4.24 MIL/uL (ref 3.87–5.11)
RDW: 16.8 % — ABNORMAL HIGH (ref 11.5–15.5)
WBC: 7.3 10*3/uL (ref 4.0–10.5)

## 2014-11-25 LAB — COMPREHENSIVE METABOLIC PANEL
ALT: 10 U/L — ABNORMAL LOW (ref 14–54)
AST: 21 U/L (ref 15–41)
Albumin: 3.4 g/dL — ABNORMAL LOW (ref 3.5–5.0)
Alkaline Phosphatase: 70 U/L (ref 38–126)
Anion gap: 12 (ref 5–15)
BUN: 35 mg/dL — ABNORMAL HIGH (ref 6–20)
CO2: 32 mmol/L (ref 22–32)
Calcium: 8.4 mg/dL — ABNORMAL LOW (ref 8.9–10.3)
Chloride: 90 mmol/L — ABNORMAL LOW (ref 101–111)
Creatinine, Ser: 1.87 mg/dL — ABNORMAL HIGH (ref 0.44–1.00)
GFR calc Af Amer: 32 mL/min — ABNORMAL LOW (ref >60)
GFR calc non Af Amer: 27 mL/min — ABNORMAL LOW (ref >60)
Glucose, Bld: 265 mg/dL — ABNORMAL HIGH (ref 65–99)
Potassium: 3.5 mmol/L (ref 3.5–5.1)
Sodium: 134 mmol/L — ABNORMAL LOW (ref 135–145)
Total Bilirubin: 0.7 mg/dL (ref 0.3–1.2)
Total Protein: 6.6 g/dL (ref 6.5–8.1)

## 2014-11-25 MED ORDER — INSULIN DETEMIR 100 UNIT/ML ~~LOC~~ SOLN
30.0000 [IU] | Freq: Two times a day (BID) | SUBCUTANEOUS | Status: DC
  Administered 2014-11-25 – 2014-11-28 (×7): 30 [IU] via SUBCUTANEOUS
  Filled 2014-11-25 (×9): qty 0.3

## 2014-11-25 MED ORDER — GLYCERIN (LAXATIVE) 2.1 G RE SUPP
1.0000 | Freq: Two times a day (BID) | RECTAL | Status: DC | PRN
  Administered 2014-11-26 – 2014-11-27 (×2): 1 via RECTAL
  Filled 2014-11-25 (×3): qty 1

## 2014-11-25 NOTE — Progress Notes (Signed)
PROGRESS NOTE  Stephanie Combs:[id number] DOB: Sep 15, 1950 DOA: 11/24/2014 PCP: Neale Burly, MD   HPI: Stephanie Combs is a 64 y.o. female with a past medical history that includes hypertension, diabetes, COPD on home oxygen, chronic diastolic heart failure, chronic pain, anxiety, lower extremity edema, presents to the emergency department with chief complaint of worsening lower extremity edema.  Subjective / 24 H Interval events - feeling better this morning, appreciates decreased swelling in her legs - no chest pain, shortness of breath, no abdominal pain, nausea or vomiting.   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   Acute renal failure superimposed on stage 3 chronic kidney disease   Cellulitis   Acute on chronic diastolic heart failure    Acute on chronic diastolic heart failure - Patient diuresed well overnight, she is net -4 L this morning, her weight improved to 276 >> 272 - blood pressure is good this morning, her renal function has improved a little bit, continue diuresis without changes  Acute renal failure superimposed on stage 3 chronic kidney disease - Creatinine on admission 1.96, 1.87 this morning, with slight improvement but not much - Monitor daily BMPs  Cellulitis - Left lower extremity with erythema early blistering - Continue vancomycin and Levaquin  COPD (chronic obstructive pulmonary disease) - Appears stable at baseline. She is on home oxygen. She doesn't always wear because she prefers to smoke.  - Stable on her medications, no wheezing   Morbid obesity - BMI 51.3.  DM type 2 (diabetes mellitus, type 2) - A1c 2 weeks ago was 8.5.  - Increase Levemir to 30 units twice a day, which is  close to her home dose since her CBGs are relatively high  HTN (hypertension), benign - Stable  Edema of lower extremity - Acute on chronic. 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 Her case was discussed with infectious disease who recommended 1 month of Levaquin and Augmentin as she was not a good candidate for PICC line. This ulcer is healing nicely currently.  - Dressing changes per usual - Antiemetics as above    Diet: Diet heart healthy/carb modified Room service appropriate?: Yes; Fluid consistency:: Thin Fluids: none  DVT Prophylaxis: heparin s.q.  Code Status: Full Code Family Communication: no family bedside  Disposition Plan: home when ready ~2-3 days  Consultants:  None   Procedures:  None    Antibiotics Vancomycin 7/15 >> Levofloxacin 7/15 >>   Studies  Dg Chest Portable 1 View  11/24/2014   CLINICAL DATA:  Shortness of breath.  Fluid retention.  EXAM: PORTABLE CHEST - 1 VIEW  COMPARISON:  One-view chest 11/18/2014.  FINDINGS: The heart is enlarged. The patient is rotated to the right. There is no significant edema or effusion to suggest failure. No focal airspace disease is evident.  IMPRESSION: Stable cardiomegaly without failure.   Electronically Signed   By: Wynetta Fines.D.  On: 11/24/2014 09:19   Objective  Filed Vitals:   11/24/14 2033 11/25/14 0232 11/25/14 0405 11/25/14 0532  BP: 120/48  118/45 119/58  Pulse: 88  62 74  Temp: 98.6 F (37 C)   98.3 F (36.8 C)  TempSrc: Oral   Oral  Resp: '16  20 18  '$ Height:      Weight:    123.741 kg (272 lb 12.8 oz)  SpO2: 95% 96% 96% 97%    Intake/Output Summary (Last 24 hours) at 11/25/14  0754 Last data filed at 11/25/14 0100  Gross per 24 hour  Intake    240 ml  Output   4300 ml  Net  -4060 ml   Filed Weights   11/24/14 0845 11/24/14 1612 11/25/14 0532  Weight: 127.007 kg (280 lb) 125.283 kg (276 lb 3.2 oz) 123.741 kg (272 lb 12.8 oz)    Exam:  General:  NAD, talkative  HEENT: no scleral icterus, PERRL  Cardiovascular: RRR without MRG, 2+ peripheral pulses, 1-2 + edema, improving  Respiratory: CTA biL, good air movement, no wheezing, no crackles, no rales  Abdomen: soft, non tender, BS +, no guarding  MSK/Extremities: no clubbing/cyanosis, no joint swelling  Skin: no rashes  Neuro: non focal  Data Reviewed: Basic Metabolic Panel:  Recent Labs Lab 11/24/14 0934 11/25/14 0606  NA 136 134*  K 3.9 3.5  CL 93* 90*  CO2 32 32  GLUCOSE 220* 265*  BUN 37* 35*  CREATININE 1.96* 1.87*  CALCIUM 8.2* 8.4*   Liver Function Tests:  Recent Labs Lab 11/25/14 0606  AST 21  ALT 10*  ALKPHOS 70  BILITOT 0.7  PROT 6.6  ALBUMIN 3.4*   No results for input(s): LIPASE, AMYLASE in the last 168 hours. No results for input(s): AMMONIA in the last 168 hours. CBC:  Recent Labs Lab 11/24/14 0934 11/25/14 0606  WBC 7.7 7.3  NEUTROABS 4.1  --   HGB 11.0* 11.0*  HCT 35.2* 35.4*  MCV 84.6 83.5  PLT 248 288   Cardiac Enzymes:  Recent Labs Lab 11/24/14 0934 11/24/14 1756  TROPONINI <0.03 <0.03   BNP (last 3 results)  Recent Labs  11/07/14 0235 11/24/14 0933  BNP 102.0* 50.0    ProBNP (last 3 results)  Recent Labs  12/04/13 0100  PROBNP 273.5*    CBG:  Recent Labs Lab 11/24/14 0853 11/24/14 1616 11/24/14 2028 11/25/14 0215 11/25/14 0713  GLUCAP 199* 191* 372* 300* 265*    No results found for this or any previous visit (from the past 240 hour(s)).   Scheduled Meds: . aspirin EC  81 mg Oral Daily  . diltiazem  240 mg Oral Daily  . furosemide  60 mg Intravenous BID  . gabapentin  1,200 mg Oral BID  . heparin  5,000 Units  Subcutaneous 3 times per day  . insulin aspart  0-20 Units Subcutaneous TID WC  . insulin aspart  0-5 Units Subcutaneous QHS  . insulin detemir  30 Units Subcutaneous BID  . ipratropium-albuterol  3 mL Nebulization Q6H WA  . levofloxacin  500 mg Oral Q48H  . levothyroxine  250 mcg Oral QAC breakfast  . nicotine  21 mg Transdermal Daily  . pantoprazole  40 mg Oral BID  . potassium chloride SA  20 mEq Oral BID  . sodium chloride  3 mL Intravenous Q12H  . tiZANidine  4 mg Oral QID  . vancomycin  1,500 mg Intravenous Q24H   Continuous Infusions:   Marzetta Board, MD  [facility] Pager [phone]. If 7 PM - 7 AM, please contact night-coverage at [email or web address], password [facility] 11/25/2014, 7:54 AM  LOS: 1 day

## 2014-11-25 NOTE — Progress Notes (Signed)
Patient complaining of chest pain.  Stat EKG ordered and obtained per protocol.  EKG showed normal sinus rhythm and vitals signs are stable.  Patient given Morphine and Xanax.  Will continue to monitor.

## 2014-11-26 LAB — BASIC METABOLIC PANEL
Anion gap: 10 (ref 5–15)
BUN: 36 mg/dL — ABNORMAL HIGH (ref 6–20)
CO2: 32 mmol/L (ref 22–32)
Calcium: 8.3 mg/dL — ABNORMAL LOW (ref 8.9–10.3)
Chloride: 95 mmol/L — ABNORMAL LOW (ref 101–111)
Creatinine, Ser: 1.69 mg/dL — ABNORMAL HIGH (ref 0.44–1.00)
GFR calc Af Amer: 36 mL/min — ABNORMAL LOW (ref >60)
GFR calc non Af Amer: 31 mL/min — ABNORMAL LOW (ref >60)
Glucose, Bld: 162 mg/dL — ABNORMAL HIGH (ref 65–99)
Potassium: 3.9 mmol/L (ref 3.5–5.1)
Sodium: 137 mmol/L (ref 135–145)

## 2014-11-26 LAB — GLUCOSE, CAPILLARY
Glucose-Capillary: 142 mg/dL — ABNORMAL HIGH (ref 65–99)
Glucose-Capillary: 213 mg/dL — ABNORMAL HIGH (ref 65–99)
Glucose-Capillary: 125 mg/dL — ABNORMAL HIGH (ref 65–99)
Glucose-Capillary: 159 mg/dL — ABNORMAL HIGH (ref 65–99)
Glucose-Capillary: 313 mg/dL — ABNORMAL HIGH (ref 65–99)

## 2014-11-26 LAB — CBC
HCT: 33.1 % — ABNORMAL LOW (ref 36.0–46.0)
Hemoglobin: 10.2 g/dL — ABNORMAL LOW (ref 12.0–15.0)
MCH: 26 pg (ref 26.0–34.0)
MCHC: 30.8 g/dL (ref 30.0–36.0)
MCV: 84.2 fL (ref 78.0–100.0)
Platelets: 246 10*3/uL (ref 150–400)
RBC: 3.93 MIL/uL (ref 3.87–5.11)
RDW: 16.8 % — ABNORMAL HIGH (ref 11.5–15.5)
WBC: 7.3 10*3/uL (ref 4.0–10.5)

## 2014-11-26 NOTE — Progress Notes (Signed)
PROGRESS NOTE  Stephanie Combs QAS:[id number] DOB: 1950-05-29 DOA: 11/24/2014 PCP: Neale Burly, MD   HPI: Stephanie Combs is a 64 y.o. female with a past medical history that includes hypertension, diabetes, COPD on home oxygen, chronic diastolic heart failure, chronic pain, anxiety, lower extremity edema, presents to the emergency department with chief complaint of worsening lower extremity edema.  Subjective / 24 H Interval events - swelling better, able to ambulate better - no chest pain, shortness of breath, no abdominal pain, nausea or vomiting.    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   Acute renal failure superimposed on stage 3 chronic kidney disease   Cellulitis   Acute on chronic diastolic heart failure   Acute on chronic diastolic heart failure - Patient diuresed well overall, her weight improved to 276 >> 272 >> 272 - blood pressure is good this morning, her renal function has improved a little bit, continue diuresis without changes via IV today and transition to po tomorrow  Acute renal failure superimposed on stage 3 chronic kidney disease - Creatinine on admission 1.96, slowly improving with diuresis - Monitor daily BMPs  Cellulitis - Left lower extremity with erythema, improving with anti-biotics, we'll transition to by mouth antibiotics tomorrow - Continue vancomycin and Levaquin  COPD (chronic obstructive pulmonary disease) - Appears stable at baseline. She is on home oxygen. She doesn't always wear because she prefers to smoke.  - Stable on her medications, no wheezing   Morbid obesity - BMI 51.3.  DM type 2 (diabetes mellitus, type 2) - A1c 2 weeks ago was 8.5.  - Increased Levemir to 30  units twice a day on 7/16, CBGs improved today  HTN (hypertension), benign - Stable  Edema of lower extremity - Acute on chronic. 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 Her case was discussed with infectious disease who recommended 1 month of Levaquin and Augmentin as she was not a good candidate for PICC line. This ulcer is healing nicely currently.  - Dressing changes per usual   Diet: Diet heart healthy/carb modified Room service appropriate?: Yes; Fluid consistency:: Thin Fluids: none  DVT Prophylaxis: heparin s.q.  Code Status: Full Code Family Communication: no family bedside  Disposition Plan: home when ready ~1-2 days  Consultants:  None   Procedures:  None    Antibiotics Vancomycin 7/15 >> Levofloxacin 7/15 >>   Studies  No results found. Objective  Filed Vitals:   11/25/14 2108 11/26/14 0121 11/26/14 0500 11/26/14 0810  BP: 114/45  107/68   Pulse: 58  70   Temp: 98 F (36.7 C)  98.8 F (37.1 C)   TempSrc: Axillary  Oral   Resp:      Height:      Weight:   123.6 kg (272 lb 7.8 oz)   SpO2: 100% 100% 99% 98%    Intake/Output Summary (Last 24 hours) at 11/26/14 1219 Last data  filed at 11/26/14 0925  Gross per 24 hour  Intake   1940 ml  Output   3350 ml  Net  -1410 ml   Filed Weights   11/24/14 1612 11/25/14 0532 11/26/14 0500  Weight: 125.283 kg (276 lb 3.2 oz) 123.741 kg (272 lb 12.8 oz) 123.6 kg (272 lb 7.8 oz)    Exam:  General:  NAD, talkative  HEENT: no scleral icterus, PERRL  Cardiovascular: RRR without MRG, 2+ peripheral pulses, 1 + edema, improving  Respiratory: CTA biL, good air movement, no wheezing, no crackles, no  rales  Abdomen: soft, non tender, BS +, no guarding  MSK/Extremities: no clubbing/cyanosis, no joint swelling  Skin: no rashes  Data Reviewed: Basic Metabolic Panel:  Recent Labs Lab 11/24/14 0934 11/25/14 0606 11/26/14 0629  NA 136 134* 137  K 3.9 3.5 3.9  CL 93* 90* 95*  CO2 32 32 32  GLUCOSE 220* 265* 162*  BUN 37* 35* 36*  CREATININE 1.96* 1.87* 1.69*  CALCIUM 8.2* 8.4* 8.3*   Liver Function Tests:  Recent Labs Lab 11/25/14 0606  AST 21  ALT 10*  ALKPHOS 70  BILITOT 0.7  PROT 6.6  ALBUMIN 3.4*   No results for input(s): LIPASE, AMYLASE in the last 168 hours. No results for input(s): AMMONIA in the last 168 hours. CBC:  Recent Labs Lab 11/24/14 0934 11/25/14 0606 11/26/14 0629  WBC 7.7 7.3 7.3  NEUTROABS 4.1  --   --   HGB 11.0* 11.0* 10.2*  HCT 35.2* 35.4* 33.1*  MCV 84.6 83.5 84.2  PLT 248 288 246   Cardiac Enzymes:  Recent Labs Lab 11/24/14 0934 11/24/14 1756  TROPONINI <0.03 <0.03   BNP (last 3 results)  Recent Labs  11/07/14 0235 11/24/14 0933  BNP 102.0* 50.0    ProBNP (last 3 results)  Recent Labs  12/04/13 0100  PROBNP 273.5*    CBG:  Recent Labs Lab 11/25/14 1634 11/25/14 2125 11/26/14 0203 11/26/14 0722 11/26/14 1145  GLUCAP 134* 226* 142* 213* 125*    No results found for this or any previous visit (from the past 240 hour(s)).   Scheduled Meds: . aspirin EC  81 mg Oral Daily  . diltiazem  240 mg Oral Daily  . furosemide  60 mg Intravenous BID  . gabapentin  1,200 mg Oral BID  . heparin  5,000 Units Subcutaneous 3 times per day  . insulin aspart  0-20 Units Subcutaneous TID WC  . insulin aspart  0-5 Units Subcutaneous QHS  . insulin detemir  30 Units Subcutaneous BID  . ipratropium-albuterol  3 mL Nebulization Q6H WA  . levofloxacin  500 mg Oral Q48H  . levothyroxine  250 mcg Oral QAC breakfast  . nicotine  21 mg Transdermal Daily  . pantoprazole  40 mg Oral BID  . potassium chloride SA  20 mEq Oral  BID  . sodium chloride  3 mL Intravenous Q12H  . tiZANidine  4 mg Oral QID  . vancomycin  1,500 mg Intravenous Q24H   Continuous Infusions:   Marzetta Board, MD [facility] Pager [phone]. If 7 PM - 7 AM, please contact night-coverage at [email or web address], password [facility] 11/26/2014, 12:19 PM  LOS: 2 days

## 2014-11-27 LAB — BASIC METABOLIC PANEL
Anion gap: 9 (ref 5–15)
BUN: 33 mg/dL — ABNORMAL HIGH (ref 6–20)
CO2: 32 mmol/L (ref 22–32)
Calcium: 8 mg/dL — ABNORMAL LOW (ref 8.9–10.3)
Chloride: 96 mmol/L — ABNORMAL LOW (ref 101–111)
Creatinine, Ser: 1.54 mg/dL — ABNORMAL HIGH (ref 0.44–1.00)
GFR calc Af Amer: 40 mL/min — ABNORMAL LOW (ref >60)
GFR calc non Af Amer: 35 mL/min — ABNORMAL LOW (ref >60)
Glucose, Bld: 282 mg/dL — ABNORMAL HIGH (ref 65–99)
Potassium: 4.3 mmol/L (ref 3.5–5.1)
Sodium: 137 mmol/L (ref 135–145)

## 2014-11-27 LAB — GLUCOSE, CAPILLARY
Glucose-Capillary: 238 mg/dL — ABNORMAL HIGH (ref 65–99)
Glucose-Capillary: 172 mg/dL — ABNORMAL HIGH (ref 65–99)
Glucose-Capillary: 161 mg/dL — ABNORMAL HIGH (ref 65–99)
Glucose-Capillary: 227 mg/dL — ABNORMAL HIGH (ref 65–99)

## 2014-11-27 MED ORDER — SORBITOL 70 % SOLN
960.0000 mL | TOPICAL_OIL | Freq: Once | ORAL | Status: AC
  Administered 2014-11-27: 960 mL via RECTAL
  Filled 2014-11-27: qty 240

## 2014-11-27 MED ORDER — LEVOFLOXACIN 500 MG PO TABS
500.0000 mg | ORAL_TABLET | ORAL | Status: DC
  Administered 2014-11-27: 500 mg via ORAL
  Filled 2014-11-27: qty 1

## 2014-11-27 MED ORDER — AMOXICILLIN-POT CLAVULANATE 875-125 MG PO TABS
1.0000 | ORAL_TABLET | Freq: Two times a day (BID) | ORAL | Status: DC
  Administered 2014-11-27 – 2014-11-28 (×3): 1 via ORAL
  Filled 2014-11-27 (×3): qty 1

## 2014-11-27 MED ORDER — FUROSEMIDE 10 MG/ML IJ SOLN
40.0000 mg | Freq: Once | INTRAMUSCULAR | Status: AC
  Administered 2014-11-27: 40 mg via INTRAVENOUS
  Filled 2014-11-27: qty 4

## 2014-11-27 MED ORDER — FUROSEMIDE 10 MG/ML IJ SOLN
60.0000 mg | Freq: Once | INTRAMUSCULAR | Status: DC
  Administered 2014-11-27: 60 mg via INTRAVENOUS
  Filled 2014-11-27: qty 6

## 2014-11-27 MED ORDER — FUROSEMIDE 10 MG/ML IJ SOLN: 60.0000 mg | Freq: Once | INTRAMUSCULAR | Status: DC

## 2014-11-27 MED ORDER — INSULIN ASPART 100 UNIT/ML ~~LOC~~ SOLN
5.0000 [IU] | Freq: Three times a day (TID) | SUBCUTANEOUS | Status: DC
  Administered 2014-11-27 – 2014-11-28 (×3): 5 [IU] via SUBCUTANEOUS

## 2014-11-27 NOTE — Discharge Instructions (Signed)
Phone number for [facility] (Aging, [place]) [phone]

## 2014-11-27 NOTE — Progress Notes (Signed)
[facility] PROGRESS NOTE  DYNA FIGUEREO GDJ:[id number] DOB: 10-20-1950 DOA: 11/24/2014 PCP: Neale Burly, MD  Assessment/Plan: Acute on chronic diastolic heart failure - weight tending up today to 279 from 276 >> 272 >> 272. Will continue IV lasix and add fluid restriction - blood pressure fair control. Creatinine continues to trend down slowly.  - anticipate needing close OP follow up with cards. Am concerned about compliance   Acute renal failure superimposed on stage 3 chronic kidney disease - Creatinine on admission 1.96 continues to slowly improve with diuresis - Monitor daily BMPs  Cellulitis - Left lower extremity with erythema. Continues to improve. - will stop Vancomycin and continue Levaquin day #4. Add back Augmentin.  COPD (chronic obstructive pulmonary disease) - remains stable at baseline. She is on home oxygen. She doesn't always wear because she prefers to smoke.  - Stable on her medications, no wheezing   Morbid obesity - BMI 51.3. Nutritional consult  DM type 2 (diabetes mellitus, type 2) - A1c 2 weeks ago was 8.5.  - Increased Levemir to 30 units twice a day on 7/16, CBG range 159-313. Will add meal coverage.  HTN (hypertension), benign - Fair control.   Edema of lower extremity - Acute on chronic. 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 Her case was discussed with infectious disease who recommended 1 month of Levaquin and Augmentin as she was not a good candidate for PICC line. This ulcer is healing nicely currently.  - Dressing changes per usual   Diet: Diet heart healthy/carb modified Room service appropriate?: Yes; Fluid consistency::  Thin. Will add fluid restriction Fluids: none  DVT Prophylaxis: heparin s.q.  Code Status: full Family Communication: none present Disposition Plan: home hopefully tomorrow  Consultants:  none  Procedures:  none  Antibiotics: Vancomycin 7/15 >>11/27/14 Levofloxacin 7/15 >>  HPI/Subjective: Sitting on side of bed. Reports feeling better yesterday than today. Denies pain/discomfort  Objective: Filed Vitals:   11/27/14 0834  BP: 117/39  Pulse:   Temp:   Resp:     Intake/Output Summary (Last 24 hours) at 11/27/14 1203 Last data filed at 11/27/14 0900  Gross per 24 hour  Intake   1100 ml  Output   3701 ml  Net  -2601 ml   Filed Weights   11/26/14 0500 11/27/14 0500 11/27/14 0552  Weight: 123.6 kg (272 lb 7.8 oz) 127.234 kg (280 lb 8 oz) 127.234 kg (280 lb 8 oz)   Exam:  General:  Obese appears comfortable  Cardiovascular: RRR no MGR 1+ LE edema  Respiratory: normal effort with conversation fine crackles base, scattered wheezing right lower base  Abdomen: obese soft +BS non-tender to palpation  Musculoskeletal: joints without swelling/erythema    Data Reviewed: Basic Metabolic Panel:  Recent Labs Lab 11/24/14 0934 11/25/14 0606 11/26/14 0629 11/27/14 0540  NA 136 134* 137 137  K 3.9 3.5 3.9 4.3  CL 93* 90* 95* 96*  CO2 32 32 32 32  GLUCOSE 220* 265* 162* 282*  BUN 37* 35* 36* 33*  CREATININE 1.96* 1.87* 1.69* 1.54*  CALCIUM 8.2* 8.4* 8.3* 8.0*   Liver Function Tests:  Recent Labs Lab 11/25/14 0606  AST 21  ALT 10*  ALKPHOS 70  BILITOT 0.7  PROT 6.6  ALBUMIN 3.4*   CBC:  Recent Labs Lab 11/24/14 0934 11/25/14 0606 11/26/14 0629  WBC 7.7 7.3 7.3  NEUTROABS 4.1  --   --   HGB 11.0* 11.0* 10.2*  HCT 35.2* 35.4* 33.1*  MCV 84.6 83.5 84.2  PLT 248 288 246   Cardiac Enzymes:  Recent Labs Lab 11/24/14 0934 11/24/14 1756  TROPONINI <0.03 <0.03   BNP (last 3 results)  Recent Labs  11/07/14 0235 11/24/14 0933  BNP  102.0* 50.0    ProBNP (last 3 results)  Recent Labs  12/04/13 0100  PROBNP 273.5*    CBG:  Recent Labs Lab 11/26/14 1145 11/26/14 1633 11/26/14 2048 11/27/14 0731 11/27/14 1126  GLUCAP 125* 159* 313* 238* 172*   Studies: No results found.  Scheduled Meds: . aspirin EC  81 mg Oral Daily  . diltiazem  240 mg Oral Daily  . furosemide  40 mg Intravenous Once  . furosemide  60 mg Intravenous Once  . gabapentin  1,200 mg Oral BID  . heparin  5,000 Units Subcutaneous 3 times per day  . insulin aspart  0-20 Units Subcutaneous TID WC  . insulin aspart  0-5 Units Subcutaneous QHS  . insulin detemir  30 Units Subcutaneous BID  . ipratropium-albuterol  3 mL Nebulization Q6H WA  . levofloxacin  500 mg Oral Q24H  . levothyroxine  250 mcg Oral QAC breakfast  . nicotine  21 mg Transdermal Daily  . pantoprazole  40 mg Oral BID  . potassium chloride SA  20 mEq Oral BID  . sodium chloride  3 mL Intravenous Q12H  . tiZANidine  4 mg Oral QID  . vancomycin  1,500 mg Intravenous Q24H   Continuous Infusions:   Principal Problem:   Acute on chronic diastolic HF (heart failure) Active Problems:   COPD (chronic obstructive pulmonary disease)   Morbid obesity   DM type 2 (diabetes mellitus, type 2)   HTN (hypertension), benign   Edema of lower extremity   Foot ulcer due to secondary DM   Acute renal failure superimposed on stage 3 chronic kidney disease   Cellulitis   Acute on chronic diastolic heart failure  [facility]  [facility] Pager [phone]. If 7PM-7AM, please contact night-coverage at [email or web address], password [facility] 11/27/2014, 12:03 PM  LOS: 3 days   Patient was seen, examined,treatment plan was discussed with the Advance Practice Provider.  I have directly reviewed the clinical findings, lab, imaging studies and management of this patient in detail. I have made the necessary changes to the above noted documentation, and agree with the documentation, as recorded by  the Advance Practice Provider.   Marzetta Board, MD [facility] [phone]

## 2014-11-27 NOTE — Care Management Note (Signed)
Case Management Note  Patient Details  Name: Stephanie Combs MRN: [id number] Date of Birth: [birth year]   Expected Discharge Date:    11/28/2014              Expected Discharge Plan:  [place]  In-House Referral:  NA  Discharge planning Services  CM Consult  Post Acute Care Choice:  Resumption of Svcs/PTA Provider Choice offered to:     DME Arranged:    DME Agency:     HH Arranged:  RN [place] Agency:  [place]  Status of Service:  In process, will continue to follow  Medicare Important Message Given:    Date Medicare IM Given:    Medicare IM give by:    Date Additional Medicare IM Given:    Additional Medicare Important Message give by:     If discussed at [place] of Stay Meetings, dates discussed:    Additional Comments: Pt is from home, lives alone and independent at baseline. Pt has home O2, neb machine prior to admission. Pt plans to discharge home with resumptions of [place] services. Romualdo Bolk, of [facility], made aware of admission and will obtain pt info from chart. Will need order to resume [place] at DC. Will cont to follow.   Sherald Barge, RN 11/27/2014, 11:52 AM

## 2014-11-27 NOTE — Care Management Important Message (Signed)
Important Message  Patient Details  Name: Stephanie Combs MRN: [id number] Date of Birth: 04/01/1951   Medicare Important Message Given:  Yes-second notification given    Sherald Barge, RN 11/27/2014, 11:58 AM

## 2014-11-28 ENCOUNTER — Inpatient Hospital Stay (HOSPITAL_COMMUNITY): Payer: Medicare Other

## 2014-11-28 LAB — BASIC METABOLIC PANEL
Anion gap: 9 (ref 5–15)
BUN: 29 mg/dL — ABNORMAL HIGH (ref 6–20)
CO2: 32 mmol/L (ref 22–32)
Calcium: 8.1 mg/dL — ABNORMAL LOW (ref 8.9–10.3)
Chloride: 95 mmol/L — ABNORMAL LOW (ref 101–111)
Creatinine, Ser: 1.35 mg/dL — ABNORMAL HIGH (ref 0.44–1.00)
GFR calc Af Amer: 47 mL/min — ABNORMAL LOW (ref >60)
GFR calc non Af Amer: 41 mL/min — ABNORMAL LOW (ref >60)
Glucose, Bld: 286 mg/dL — ABNORMAL HIGH (ref 65–99)
Potassium: 3.6 mmol/L (ref 3.5–5.1)
Sodium: 136 mmol/L (ref 135–145)

## 2014-11-28 LAB — GLUCOSE, CAPILLARY
Glucose-Capillary: 205 mg/dL — ABNORMAL HIGH (ref 65–99)
Glucose-Capillary: 144 mg/dL — ABNORMAL HIGH (ref 65–99)

## 2014-11-28 MED ORDER — INSULIN DETEMIR 100 UNIT/ML ~~LOC~~ SOLN
33.0000 [IU] | Freq: Two times a day (BID) | SUBCUTANEOUS | Status: DC
  Filled 2014-11-28 (×2): qty 0.33

## 2014-11-28 MED ORDER — AMOXICILLIN-POT CLAVULANATE 875-125 MG PO TABS: 1.0000 | ORAL_TABLET | Freq: Two times a day (BID) | ORAL | Status: DC

## 2014-11-28 MED ORDER — LEVOFLOXACIN 500 MG PO TABS: 500.0000 mg | ORAL_TABLET | ORAL | Status: DC

## 2014-11-28 NOTE — Progress Notes (Signed)
Removed pt IV, tolerated well.  Reviewed discharge instructions,including HF info, gave pt a scale and reviewed prescriptions with pt.  Answered questions at this time.

## 2014-11-28 NOTE — Progress Notes (Signed)
Inpatient Diabetes Program Recommendations  AACE/ADA: New Consensus Statement on Inpatient Glycemic Control (2013)  Target Ranges:  Prepandial:   less than 140 mg/dL      Peak postprandial:   less than 180 mg/dL (1-2 hours)      Critically ill patients:  140 - 180 mg/dL   Results for IMOGINE, CARVELL (MRN [id number]) as of 11/28/2014 08:22  Ref. Range 11/27/2014 07:31 11/27/2014 11:26 11/27/2014 16:07 11/27/2014 21:09 11/28/2014 07:34  Glucose-Capillary Latest Ref Range: 65-99 mg/dL 238 (H) 172 (H) 161 (H) 227 (H) 205 (H)   Current orders for Inpatient glycemic control: Levemir 30 units BID, Novolog 5 units TID with meals for meal coverage, Novolog 0-20 units TID with meals, Novolog 0-5 units HS  Inpatient Diabetes Program Recommendations Insulin - Basal: Please consider increasing Levemir to 33 units BID.  Thanks, Barnie Alderman, RN, MSN, CCRN, CDE Diabetes Coordinator Inpatient Diabetes Program [phone] (Team Pager from [place] to [place]) [phone] ([facility] office) [phone] [facility] office) [phone] [facility] office)

## 2014-11-28 NOTE — Care Management Note (Signed)
Case Management Note  Patient Details  Name: Stephanie Combs MRN: [id number] Date of Birth: 20-Jan-1951  Expected Discharge Date:                  Expected Discharge Plan:  [place]  In-House Referral:  NA  Discharge planning Services  CM Consult  Post Acute Care Choice:  Resumption of Svcs/PTA Provider Choice offered to:     DME Arranged:    DME Agency:     HH Arranged:  RN [place] Agency:  [place]  Status of Service:  Completed, signed off  Medicare Important Message Given:  Yes-second notification given Date Medicare IM Given:    Medicare IM give by:    Date Additional Medicare IM Given:    Additional Medicare Important Message give by:     If discussed at [place] of Stay Meetings, dates discussed:    Additional Comments: MD anticipates DC later today. Pt plans to discharge home with [facility] services. [facility], per pt's choice, is aware of pending DC. Patient aware [facility] has 48 hours to resume [place] services. No further CM needs.  Sherald Barge, RN 11/28/2014, 3:37 PM

## 2014-11-28 NOTE — Discharge Summary (Signed)
Physician Discharge Summary  Stephanie Combs WVP:[id number] DOB: 1951/02/24 DOA: 11/24/2014  PCP: Neale Burly, MD  Admit date: 11/24/2014 Discharge date: 11/28/2014  Time spent: 40 minutes  Recommendations for Outpatient Follow-up:  1. Follow up with cardiology next week to evaluate volume status and BP control. 2. Follow up with PCP 1 week evaluate kidney function  Discharge Diagnoses:  Principal Problem:   Acute on chronic diastolic HF (heart failure) Active Problems:   COPD (chronic obstructive pulmonary disease)   Morbid obesity   DM type 2 (diabetes mellitus, type 2)   HTN (hypertension), benign   Edema of lower extremity   Foot ulcer due to secondary DM   Acute renal failure superimposed on stage 3 chronic kidney disease   Cellulitis   Acute on chronic diastolic heart failure   Discharge Condition: stable  Diet recommendation: heart healthy  Filed Weights   11/27/14 0500 11/27/14 0552 11/28/14 0538  Weight: 127.234 kg (280 lb 8 oz) 127.234 kg (280 lb 8 oz) 125.9 kg (277 lb 9 oz)    History of present illness:  Stephanie Combs is a 64 y.o. female with a past medical history that includes hypertension, diabetes, COPD on home oxygen, chronic diastolic heart failure, chronic pain, anxiety, lower extremity edema, presented to the emergency department 11/24/14 with chief complaint of worsening lower extremity edema. Initial evaluation concerning for acute on chronic diastolic heart failure and acute on chronic kidney failure. Patient stated she was discharged from the hospital July 1 and was doing well until about 3 days prior when she developed a gradual increase of her lower extremity edema. She reported compliance with her medication and her diet. Associated symptoms include shortness of breath worsening orthopnea, pain with ambulation nonproductive cough. She denied fever chills headache visual disturbances syncope or near-syncope. She denied abdominal pain nausea vomiting  diarrhea constipation melanoma. She denied  hematuria frequency or urgency. Did report that home health nursing had been coming to her house for dressing changes for diabetic wound on her right great toe. Workup in the emergency department significant for creatinine of 1.96 BUN 37 chloride 93 hemoglobin 11 serum glucose 220, proBNP 50, troponin negative. Chest X-ray yielded stable cardiomegaly without failure. EKG with sinus rhythm no acute changes. She was hemodynamically stable afebrile oxygen saturation level 92% on 3 L. In the emergency department she was provided with albuterol nebulizer 40 mg of Lasix DuoNeb nebulizer and 60 mg prednisone  Hospital Course:  Acute on chronic diastolic heart failure -  Admitted and provided with IV lasix. Diuresed well. Weight trended from 276 >> 272 >> 272. Volume status -12L but doubt reliabililtiy of data. Blood pressure fair control. I am concerned about compliance. Follow up with cards as scheduled. Acute renal failure superimposed on stage 3 chronic kidney disease - Creatinine on admission 1.96 and 1.3 on discharge Cellulitis - Left lower extremity with erythema. Received  Vancomycin and Levaquin for 4 days. Will discharge with Augmentin and levaquin as per prior plans outlined below COPD (chronic obstructive pulmonary disease) - remained stable.  Supposed to wear oxygen but doesn't always wear because she prefers to smoke.  Morbid obesity - BMI 51.3. Nutritional consult DM type 2 (diabetes mellitus, type 2) - A1c 2 weeks ago was 8.5.  Continue  HTN (hypertension), benign - Fair control.  Edema of lower extremity - Acute on chronic. Improved at discharge Foot ulcer due to secondary DM - Recent hospitalization included plain films that did not show any osteomyelitis,  and MRI showed Mild marrow edema in the distal phalanx of the great toe is worrisome for osteomyelitis but could reflect reactive change - At that time she was evaluated by general surgery who did  not feel surgery needed. Her case was discussed with infectious disease who recommended 1 month of Levaquin and Augmentin as she was not a good candidate for PICC line. This ulcer is healing nicely currently.   - per current discharge paperwork it inadvertently shows that patient is to take Augmentin and levofloxacin for 3 days, I have realized this after the patient left, I have called the patient and discussed with her over the phone in the evening of the day of her discharge on 7/19, I explained to her that the antibiotics regimen is to be resumed as it was previously arranged by Dr. Roderic Palau, she expressed understanding, she was able to relate back to me that she will continue to take antibiotics for the full 1 month, she confirmed that she has the antibiotics in her home from her previous hospital stay and she will take them until finished.  Procedures:  none  Consultations:  none  Discharge Exam: Filed Vitals:   11/28/14 0538  BP: 131/44  Pulse: 80  Temp: 98.5 F (36.9 C)  Resp: 20   General: obese appears comfortable Cardiovascular: RRR no MGR trace LE edema Respiratory: normal effort BS clear bilaterally no wheeze  Discharge Instructions   Discharge Instructions    Diet - low sodium heart healthy    Complete by:  As directed      Increase activity slowly    Complete by:  As directed           Current Discharge Medication List    CONTINUE these medications which have CHANGED   Details  amoxicillin-clavulanate (AUGMENTIN) 875-125 MG per tablet Take 1 tablet by mouth every 12 (twelve) hours. For 3 more days Qty: 6 tablet, Refills: 0    levofloxacin (LEVAQUIN) 500 MG tablet Take 1 tablet (500 mg total) by mouth daily. Qty: 3 tablet, Refills: 0      CONTINUE these medications which have NOT CHANGED   Details  albuterol (PROAIR HFA) 108 (90 BASE) MCG/ACT inhaler Inhale 1 puff into the lungs every 4 (four) hours as needed. For shortness of breath Qty: 1 Inhaler,  Refills: 1    alprazolam (XANAX) 2 MG tablet Take 1 tablet (2 mg total) by mouth 3 (three) times daily as needed for anxiety.    aspirin EC 81 MG tablet Take 81 mg by mouth daily.    benzonatate (TESSALON) 100 MG capsule Take 1 capsule (100 mg total) by mouth 3 (three) times daily as needed for cough. Qty: 21 capsule, Refills: 0    Bisacodyl (FLEET LAXATIVE RE) Place 1 application rectally daily as needed (constipation).    diltiazem (CARDIZEM CD) 240 MG 24 hr capsule Take 1 capsule (240 mg total) by mouth daily. Qty: 30 capsule, Refills: 2    diphenhydrAMINE (BENADRYL) 25 MG tablet Take 25 mg by mouth every 6 (six) hours as needed for allergies.    empagliflozin (JARDIANCE) 25 MG TABS tablet Take 25 mg by mouth daily.    furosemide (LASIX) 20 MG tablet Take 4 tablets (80 mg total) by mouth 2 (two) times daily. Qty: 180 tablet, Refills: 1    gabapentin (NEURONTIN) 600 MG tablet Take 1 tablet (600 mg total) by mouth 4 (four) times daily. Qty: 60 tablet, Refills: 2    insulin aspart (NOVOLOG) 100  UNIT/ML injection Inject 0-20 Units into the skin 3 (three) times daily with meals. Takes 20 Units in am and 20 Units at night.  Per sliding scale    insulin detemir (LEVEMIR) 100 UNIT/ML injection Inject 40 Units into the skin 2 (two) times daily. Takes 40 Units in am and 40 Units at night.    ipratropium-albuterol (DUONEB) 0.5-2.5 (3) MG/3ML SOLN Take 3 mLs by nebulization every 6 (six) hours. Qty: 360 mL, Refills: 2    levothyroxine (SYNTHROID, LEVOTHROID) 125 MCG tablet Take 2 tablets (250 mcg total) by mouth daily before breakfast. Qty: 30 tablet, Refills: 2    meclizine (ANTIVERT) 25 MG tablet Take 25 mg by mouth 3 (three) times daily as needed for dizziness.     metFORMIN (GLUCOPHAGE) 500 MG tablet Take 500 mg by mouth 2 (two) times daily with a meal.    naproxen sodium (ANAPROX) 220 MG tablet Take 440 mg by mouth 2 (two) times daily as needed (headache).    nicotine (NICODERM CQ  - DOSED IN MG/24 HOURS) 21 mg/24hr patch Place 1 patch (21 mg total) onto the skin daily. Qty: 28 patch, Refills: 0    nystatin (MYCOSTATIN/NYSTOP) 100000 UNIT/GM POWD Apply 1 g topically 2 (two) times daily.    Omega-3 Fatty Acids (FISH OIL PO) Take 1 capsule by mouth 2 (two) times daily.     ondansetron (ZOFRAN ODT) 8 MG disintegrating tablet Take 1 tablet (8 mg total) by mouth every 8 (eight) hours as needed for nausea or vomiting. Qty: 10 tablet, Refills: 0    OVER THE COUNTER MEDICATION Apply 1 application topically daily.    oxyCODONE-acetaminophen (PERCOCET) 10-325 MG per tablet Take 1 tablet by mouth every 4 (four) hours as needed for pain. Qty: 10 tablet, Refills: 0    pantoprazole (PROTONIX) 40 MG tablet Take 1 tablet (40 mg total) by mouth 2 (two) times daily. Qty: 60 tablet, Refills: 0    potassium chloride (K-DUR) 10 MEQ tablet Take 20 mEq by mouth 2 (two) times daily.     tiZANidine (ZANAFLEX) 4 MG tablet Take 4 mg by mouth 4 (four) times daily.       Allergies  Allergen Reactions  . Dilaudid [Hydromorphone Hcl] Itching  . Actifed Cold-Allergy [Chlorpheniramine-Phenylephrine]     "I was sick and red and it didn't agree with me at all"  . Other Cough    Pt states she is allergic to ragweed and that she starts coughing and sneezing like crazy  . Penicillins Hives and Itching  . Reglan [Metoclopramide] Itching  . Valium [Diazepam] Itching  . Vistaril [Hydroxyzine Hcl] Itching   Follow-up Information    Follow up with [facility].   Contact information:   [address] [place] [address] [phone]       Follow up with Jory Sims, NP On 12/07/2014.   Specialties:  Nurse Practitioner, Radiology, Cardiology   Why:  appointment at 1:50   Contact information:   [place] [address] [phone]      The results of significant diagnostics from this hospitalization (including imaging, microbiology, ancillary and  laboratory) are listed below for reference.    Significant Diagnostic Studies: Mr Foot Right Wo Contrast  11/09/2014   CLINICAL DATA:  Wound on the right great toe for several weeks. Diabetic patient. Initial encounter.  EXAM: MRI OF THE RIGHT FOREFOOT WITHOUT CONTRAST  TECHNIQUE: Multiplanar, multisequence MR imaging was performed. No intravenous contrast was administered.  COMPARISON:  Plain films of the right great toe 11/07/2014.  FINDINGS: There is intense subcutaneous edema over the dorsum of the foot. The appears to be a soft tissue wound along the medial aspect of the distal right great toe. No focal fluid collection is identified. Mildly increased T2 signal is seen in the distal phalanx of the great toe. Bone marrow signal is otherwise unremarkable. All visualized tendons appear intact. Intrinsic musculature of the foot is atrophied. No mass identified.  IMPRESSION: Wound along the medial aspect of the right great toe. No abscess is identified. Mild marrow edema in the distal phalanx of the great toe is worrisome for osteomyelitis but could reflect reactive change.  Intense subcutaneous edema over the dorsum of the foot.  Atrophy of the intrinsic musculature of the foot.   Electronically Signed   By: Inge Rise M.D.   On: 11/09/2014 10:55   US Venous Img Lower Unilateral Right  11/28/2014   CLINICAL DATA:  Right lower extremity swelling.  EXAM: Right LOWER EXTREMITY VENOUS DOPPLER ULTRASOUND  TECHNIQUE: Gray-scale sonography with graded compression, as well as color Doppler and duplex ultrasound were performed to evaluate the lower extremity deep venous systems from the level of the common femoral vein and including the common femoral, femoral, profunda femoral, popliteal and calf veins including the posterior tibial, peroneal and gastrocnemius veins when visible. The superficial great saphenous vein was also interrogated. Spectral Doppler was utilized to evaluate flow at rest and with distal  augmentation maneuvers in the common femoral, femoral and popliteal veins.  COMPARISON:  None.  FINDINGS: Contralateral Common Femoral Vein: Respiratory phasicity is normal and symmetric with the symptomatic side. No evidence of thrombus. Normal compressibility.  Common Femoral Vein: No evidence of thrombus. Normal compressibility, respiratory phasicity and response to augmentation.  Saphenofemoral Junction: No evidence of thrombus. Normal compressibility and flow on color Doppler imaging.  Profunda Femoral Vein: No evidence of thrombus. Normal compressibility and flow on color Doppler imaging.  Femoral Vein: No evidence of thrombus. Normal compressibility, respiratory phasicity and response to augmentation.  Popliteal Vein: No evidence of thrombus. Normal compressibility, respiratory phasicity and response to augmentation.  Calf Veins: No evidence of thrombus. Normal compressibility and flow on color Doppler imaging.  Superficial Great Saphenous Vein: No evidence of thrombus. Normal compressibility and flow on color Doppler imaging.  Venous Reflux:  None.  Other Findings:  None.  IMPRESSION: No evidence of deep venous thrombosis seen in right lower extremity.   Electronically Signed   By: Marijo Conception, M.D.   On: 11/28/2014 14:44   US Arterial Seg Multiple  11/09/2014   CLINICAL DATA:  Right great toe diabetic ulcer for 5 weeks. Peripheral vascular disease, leg pain concern for claudication.  EXAM: NONINVASIVE PHYSIOLOGIC VASCULAR STUDY OF BILATERAL LOWER EXTREMITIES  TECHNIQUE: Evaluation of both lower extremities was performed at rest, including calculation of ankle-brachial indices, multiple segmental pressure evaluation, segmental Doppler and segmental pulse volume recording.  COMPARISON:  None.  FINDINGS: Limited exam. Toe pressures were not obtainable. Patient refused pressure measurements in the thigh and popliteal regions.  Right ABI:  1.32  Left ABI:  1.21  Right Lower Extremity: Right lower thigh  vessels are noncompressible compatible with peripheral atherosclerosis. Right posterior tibial tracing is triphasic. Right dorsalis pedis tracing is minimally dampened and biphasic. No significant waveform or pulse volume recording abnormality. Toe pressure is not obtained. Right ABI 1.32, likely falsely elevated related to peripheral calcific atherosclerosis.  Left Lower Extremity: Triphasic left posterior tibial  and dorsalis pedis tracings. No significant pressure gradient. Symmetric pulse from recordings.  IMPRESSION: ABIs greater than 1 bilaterally, suspect falsely elevated related to peripheral atherosclerosis. No significant posterior tibial or dorsalis pedis waveform abnormality at either ankle. Symmetric pulse volume recordings.   Electronically Signed   By: Jerilynn Mages.  Shick M.D.   On: 11/09/2014 15:18   Dg Chest Portable 1 View  11/24/2014   CLINICAL DATA:  Shortness of breath.  Fluid retention.  EXAM: PORTABLE CHEST - 1 VIEW  COMPARISON:  One-view chest 11/18/2014.  FINDINGS: The heart is enlarged. The patient is rotated to the right. There is no significant edema or effusion to suggest failure. No focal airspace disease is evident.  IMPRESSION: Stable cardiomegaly without failure.   Electronically Signed   By: San Morelle M.D.   On: 11/24/2014 09:19   Dg Chest Portable 1 View  11/07/2014   CLINICAL DATA:  Dyspnea for 2 weeks.  EXAM: PORTABLE CHEST - 1 VIEW  COMPARISON:  07/19/2014  FINDINGS: A single AP portable view of the chest demonstrates no focal airspace consolidation or alveolar edema. The lungs are grossly clear. There is no large effusion or pneumothorax. Cardiac and mediastinal contours appear unremarkable.  IMPRESSION: No active disease.   Electronically Signed   By: Andreas Newport M.D.   On: 11/07/2014 02:57   Dg Foot 2 Views Right  11/07/2014   CLINICAL DATA:  Diabetic foot ulcer.  Right great toe wound.  EXAM: RIGHT FOOT - 2 VIEW  COMPARISON:  None.  FINDINGS: There is soft  tissue ulceration along the dorsal aspect of the great toe with several punctate radiodensities along the margins of the ulcer. There is soft tissue swelling involving the great toe, with diffuse soft tissue swelling about the foot and visualized lower leg as well. No dissecting soft tissue emphysema is identified. No acute fracture, dislocation, or osseous erosion is identified. The bones are diffusely osteopenic. Small plantar calcaneal enthesophyte is noted.  IMPRESSION: 1. Right great toe soft tissue ulceration with several punctate densities along its margin. Correlate for foreign bodies on the skin or in the ulcer. 2. No radiographic evidence of active osteomyelitis. 3. Diffuse soft tissue swelling.   Electronically Signed   By: Logan Bores   On: 11/07/2014 09:22    Microbiology: No results found for this or any previous visit (from the past 240 hour(s)).   Labs: Basic Metabolic Panel:  Recent Labs Lab 11/24/14 0934 11/25/14 0606 11/26/14 0629 11/27/14 0540 11/28/14 0526  NA 136 134* 137 137 136  K 3.9 3.5 3.9 4.3 3.6  CL 93* 90* 95* 96* 95*  CO2 32 32 32 32 32  GLUCOSE 220* 265* 162* 282* 286*  BUN 37* 35* 36* 33* 29*  CREATININE 1.96* 1.87* 1.69* 1.54* 1.35*  CALCIUM 8.2* 8.4* 8.3* 8.0* 8.1*   Liver Function Tests:  Recent Labs Lab 11/25/14 0606  AST 21  ALT 10*  ALKPHOS 70  BILITOT 0.7  PROT 6.6  ALBUMIN 3.4*   CBC:  Recent Labs Lab 11/24/14 0934 11/25/14 0606 11/26/14 0629  WBC 7.7 7.3 7.3  NEUTROABS 4.1  --   --   HGB 11.0* 11.0* 10.2*  HCT 35.2* 35.4* 33.1*  MCV 84.6 83.5 84.2  PLT 248 288 246   Cardiac Enzymes:  Recent Labs Lab 11/24/14 0934 11/24/14 1756  TROPONINI <0.03 <0.03   BNP: BNP (last 3 results)  Recent Labs  11/07/14 0235 11/24/14 0933  BNP 102.0* 50.0    ProBNP (  last 3 results)  Recent Labs  12/04/13 0100  PROBNP 273.5*    CBG:  Recent Labs Lab 11/27/14 1126 11/27/14 1607 11/27/14 2109 11/28/14 0734  11/28/14 1108  GLUCAP 172* 161* 227* 205* 144*   Signed:  BLACK,KAREN M  [facility] 11/28/2014, 3:42 PM  Patient was seen, examined,treatment plan was discussed with the Advance Practice Provider.  I have directly reviewed the clinical findings, lab, imaging studies and management of this patient in detail. I have made the necessary changes to the above noted documentation, and agree with the documentation, as recorded by the Advance Practice Provider.    Marzetta Board, MD [facility] [phone]

## 2014-12-04 ENCOUNTER — Observation Stay (HOSPITAL_COMMUNITY)
Admission: EM | Admit: 2014-12-04 | Discharge: 2014-12-06 | Disposition: A | Discharge status: Home / Self Care | Payer: Medicare Other | Attending: Internal Medicine | Admitting: Internal Medicine

## 2014-12-04 ENCOUNTER — Encounter (HOSPITAL_COMMUNITY): Payer: Self-pay | Admitting: Emergency Medicine

## 2014-12-04 ENCOUNTER — Emergency Department (HOSPITAL_COMMUNITY): Payer: Medicare Other

## 2014-12-04 ENCOUNTER — Observation Stay (HOSPITAL_COMMUNITY): Payer: Medicare Other

## 2014-12-04 DIAGNOSIS — I10 Essential (primary) hypertension: Secondary | ICD-10-CM | POA: Diagnosis not present

## 2014-12-04 DIAGNOSIS — J449 Chronic obstructive pulmonary disease, unspecified: Secondary | ICD-10-CM | POA: Diagnosis not present

## 2014-12-04 DIAGNOSIS — N183 Chronic kidney disease, stage 3 unspecified: Secondary | ICD-10-CM | POA: Diagnosis present

## 2014-12-04 DIAGNOSIS — N189 Chronic kidney disease, unspecified: Secondary | ICD-10-CM

## 2014-12-04 DIAGNOSIS — E119 Type 2 diabetes mellitus without complications: Secondary | ICD-10-CM | POA: Diagnosis not present

## 2014-12-04 DIAGNOSIS — R600 Localized edema: Secondary | ICD-10-CM | POA: Insufficient documentation

## 2014-12-04 DIAGNOSIS — Z794 Long term (current) use of insulin: Secondary | ICD-10-CM | POA: Diagnosis not present

## 2014-12-04 DIAGNOSIS — Z880 Allergy status to penicillin: Secondary | ICD-10-CM | POA: Diagnosis not present

## 2014-12-04 DIAGNOSIS — I509 Heart failure, unspecified: Secondary | ICD-10-CM | POA: Insufficient documentation

## 2014-12-04 DIAGNOSIS — G9340 Encephalopathy, unspecified: Secondary | ICD-10-CM | POA: Diagnosis not present

## 2014-12-04 DIAGNOSIS — E13621 Other specified diabetes mellitus with foot ulcer: Secondary | ICD-10-CM | POA: Diagnosis present

## 2014-12-04 DIAGNOSIS — E1122 Type 2 diabetes mellitus with diabetic chronic kidney disease: Secondary | ICD-10-CM

## 2014-12-04 DIAGNOSIS — R609 Edema, unspecified: Secondary | ICD-10-CM

## 2014-12-04 DIAGNOSIS — Z720 Tobacco use: Secondary | ICD-10-CM | POA: Insufficient documentation

## 2014-12-04 DIAGNOSIS — R079 Chest pain, unspecified: Secondary | ICD-10-CM | POA: Diagnosis present

## 2014-12-04 DIAGNOSIS — R4182 Altered mental status, unspecified: Secondary | ICD-10-CM | POA: Diagnosis present

## 2014-12-04 DIAGNOSIS — N179 Acute kidney failure, unspecified: Secondary | ICD-10-CM

## 2014-12-04 DIAGNOSIS — I5032 Chronic diastolic (congestive) heart failure: Secondary | ICD-10-CM

## 2014-12-04 DIAGNOSIS — Z9981 Dependence on supplemental oxygen: Secondary | ICD-10-CM | POA: Insufficient documentation

## 2014-12-04 DIAGNOSIS — I9589 Other hypotension: Secondary | ICD-10-CM

## 2014-12-04 DIAGNOSIS — D631 Anemia in chronic kidney disease: Secondary | ICD-10-CM

## 2014-12-04 DIAGNOSIS — Z79899 Other long term (current) drug therapy: Secondary | ICD-10-CM | POA: Diagnosis not present

## 2014-12-04 DIAGNOSIS — G8929 Other chronic pain: Secondary | ICD-10-CM | POA: Diagnosis not present

## 2014-12-04 DIAGNOSIS — N289 Disorder of kidney and ureter, unspecified: Secondary | ICD-10-CM

## 2014-12-04 DIAGNOSIS — I959 Hypotension, unspecified: Secondary | ICD-10-CM

## 2014-12-04 DIAGNOSIS — E039 Hypothyroidism, unspecified: Secondary | ICD-10-CM | POA: Insufficient documentation

## 2014-12-04 DIAGNOSIS — Z7982 Long term (current) use of aspirin: Secondary | ICD-10-CM | POA: Insufficient documentation

## 2014-12-04 DIAGNOSIS — L97509 Non-pressure chronic ulcer of other part of unspecified foot with unspecified severity: Secondary | ICD-10-CM

## 2014-12-04 LAB — CBC WITH DIFFERENTIAL/PLATELET
Basophils Absolute: 0.2 10*3/uL — ABNORMAL HIGH (ref 0.0–0.1)
Basophils Relative: 2 % — ABNORMAL HIGH (ref 0–1)
Eosinophils Absolute: 0.6 10*3/uL (ref 0.0–0.7)
Eosinophils Relative: 6 % — ABNORMAL HIGH (ref 0–5)
HCT: 32.6 % — ABNORMAL LOW (ref 36.0–46.0)
Hemoglobin: 10.2 g/dL — ABNORMAL LOW (ref 12.0–15.0)
Lymphocytes Relative: 29 % (ref 12–46)
Lymphs Abs: 3 10*3/uL (ref 0.7–4.0)
MCH: 26.1 pg (ref 26.0–34.0)
MCHC: 31.3 g/dL (ref 30.0–36.0)
MCV: 83.4 fL (ref 78.0–100.0)
Monocytes Absolute: 1.1 10*3/uL — ABNORMAL HIGH (ref 0.1–1.0)
Monocytes Relative: 10 % (ref 3–12)
Neutro Abs: 5.4 10*3/uL (ref 1.7–7.7)
Neutrophils Relative %: 53 % (ref 43–77)
Platelets: 261 10*3/uL (ref 150–400)
RBC: 3.91 MIL/uL (ref 3.87–5.11)
RDW: 17.4 % — ABNORMAL HIGH (ref 11.5–15.5)
WBC: 10.2 10*3/uL (ref 4.0–10.5)

## 2014-12-04 LAB — COMPREHENSIVE METABOLIC PANEL
ALT: 15 U/L (ref 14–54)
AST: 16 U/L (ref 15–41)
Albumin: 3.2 g/dL — ABNORMAL LOW (ref 3.5–5.0)
Alkaline Phosphatase: 57 U/L (ref 38–126)
Anion gap: 9 (ref 5–15)
BUN: 61 mg/dL — ABNORMAL HIGH (ref 6–20)
CO2: 30 mmol/L (ref 22–32)
Calcium: 8.7 mg/dL — ABNORMAL LOW (ref 8.9–10.3)
Chloride: 96 mmol/L — ABNORMAL LOW (ref 101–111)
Creatinine, Ser: 3 mg/dL — ABNORMAL HIGH (ref 0.44–1.00)
GFR calc Af Amer: 18 mL/min — ABNORMAL LOW (ref >60)
GFR calc non Af Amer: 15 mL/min — ABNORMAL LOW (ref >60)
Glucose, Bld: 74 mg/dL (ref 65–99)
Potassium: 4.5 mmol/L (ref 3.5–5.1)
Sodium: 135 mmol/L (ref 135–145)
Total Bilirubin: 0.3 mg/dL (ref 0.3–1.2)
Total Protein: 6.1 g/dL — ABNORMAL LOW (ref 6.5–8.1)

## 2014-12-04 LAB — I-STAT CHEM 8, ED
BUN: 58 mg/dL — ABNORMAL HIGH (ref 6–20)
Calcium, Ion: 1.19 mmol/L (ref 1.13–1.30)
Chloride: 95 mmol/L — ABNORMAL LOW (ref 101–111)
Creatinine, Ser: 2.9 mg/dL — ABNORMAL HIGH (ref 0.44–1.00)
Glucose, Bld: 89 mg/dL (ref 65–99)
HCT: 33 % — ABNORMAL LOW (ref 36.0–46.0)
Hemoglobin: 11.2 g/dL — ABNORMAL LOW (ref 12.0–15.0)
Potassium: 4.4 mmol/L (ref 3.5–5.1)
Sodium: 135 mmol/L (ref 135–145)
TCO2: 29 mmol/L (ref 0–100)

## 2014-12-04 LAB — GLUCOSE, CAPILLARY
Glucose-Capillary: 57 mg/dL — ABNORMAL LOW (ref 65–99)
Glucose-Capillary: 66 mg/dL (ref 65–99)
Glucose-Capillary: 81 mg/dL (ref 65–99)
Glucose-Capillary: 115 mg/dL — ABNORMAL HIGH (ref 65–99)
Glucose-Capillary: 134 mg/dL — ABNORMAL HIGH (ref 65–99)
Glucose-Capillary: 198 mg/dL — ABNORMAL HIGH (ref 65–99)
Glucose-Capillary: 236 mg/dL — ABNORMAL HIGH (ref 65–99)

## 2014-12-04 LAB — URINE MICROSCOPIC-ADD ON

## 2014-12-04 LAB — URINALYSIS, ROUTINE W REFLEX MICROSCOPIC
Bilirubin Urine: NEGATIVE
Glucose, UA: NEGATIVE mg/dL
Leukocytes, UA: NEGATIVE
Nitrite: NEGATIVE
Protein, ur: NEGATIVE mg/dL
Specific Gravity, Urine: 1.02 (ref 1.005–1.030)
Urobilinogen, UA: 0.2 mg/dL (ref 0.0–1.0)
pH: 5.5 (ref 5.0–8.0)

## 2014-12-04 LAB — TROPONIN I
Troponin I: <0.03 ng/mL (ref <0.031)
Troponin I: <0.03 ng/mL (ref <0.031)
Troponin I: <0.03 ng/mL (ref <0.031)
Troponin I: <0.03 ng/mL (ref <0.031)

## 2014-12-04 LAB — LACTIC ACID, PLASMA
Lactic Acid, Venous: 1.1 mmol/L (ref 0.5–2.0)
Lactic Acid, Venous: 0.7 mmol/L (ref 0.5–2.0)

## 2014-12-04 LAB — CBG MONITORING, ED: Glucose-Capillary: 71 mg/dL (ref 65–99)

## 2014-12-04 LAB — MRSA PCR SCREENING: MRSA by PCR: NEGATIVE

## 2014-12-04 LAB — BRAIN NATRIURETIC PEPTIDE: B Natriuretic Peptide: 50 pg/mL (ref 0.0–100.0)

## 2014-12-04 MED ORDER — ACETAMINOPHEN 325 MG PO TABS
650.0000 mg | ORAL_TABLET | Freq: Four times a day (QID) | ORAL | Status: DC | PRN
  Administered 2014-12-05 (×2): 650 mg via ORAL
  Filled 2014-12-04 (×2): qty 2

## 2014-12-04 MED ORDER — LEVOTHYROXINE SODIUM 50 MCG PO TABS
250.0000 ug | ORAL_TABLET | Freq: Every day | ORAL | Status: DC
  Administered 2014-12-05 – 2014-12-06 (×2): 250 ug via ORAL
  Filled 2014-12-04 (×2): qty 1
  Filled 2014-12-04: qty 2
  Filled 2014-12-04: qty 1
  Filled 2014-12-04 (×2): qty 2

## 2014-12-04 MED ORDER — SODIUM CHLORIDE 0.9 % IJ SOLN: 3.0000 mL | INTRAMUSCULAR | Status: DC | PRN

## 2014-12-04 MED ORDER — NICOTINE 21 MG/24HR TD PT24
21.0000 mg | MEDICATED_PATCH | Freq: Every day | TRANSDERMAL | Status: DC
  Administered 2014-12-04 – 2014-12-06 (×3): 21 mg via TRANSDERMAL
  Filled 2014-12-04 (×3): qty 1

## 2014-12-04 MED ORDER — ONDANSETRON 4 MG PO TBDP
8.0000 mg | ORAL_TABLET | Freq: Three times a day (TID) | ORAL | Status: DC | PRN
  Filled 2014-12-04: qty 1

## 2014-12-04 MED ORDER — AMOXICILLIN-POT CLAVULANATE 875-125 MG PO TABS
1.0000 | ORAL_TABLET | Freq: Two times a day (BID) | ORAL | Status: DC
  Administered 2014-12-04 – 2014-12-06 (×5): 1 via ORAL
  Filled 2014-12-04 (×9): qty 1

## 2014-12-04 MED ORDER — SODIUM CHLORIDE 0.9 % IV SOLN: 250.0000 mL | INTRAVENOUS | Status: DC | PRN

## 2014-12-04 MED ORDER — HEPARIN SODIUM (PORCINE) 5000 UNIT/ML IJ SOLN
5000.0000 [IU] | Freq: Three times a day (TID) | INTRAMUSCULAR | Status: DC
  Administered 2014-12-04 – 2014-12-06 (×8): 5000 [IU] via SUBCUTANEOUS
  Filled 2014-12-04 (×7): qty 1

## 2014-12-04 MED ORDER — ASPIRIN EC 81 MG PO TBEC
81.0000 mg | DELAYED_RELEASE_TABLET | Freq: Every day | ORAL | Status: DC
  Administered 2014-12-04 – 2014-12-06 (×3): 81 mg via ORAL
  Filled 2014-12-04 (×3): qty 1

## 2014-12-04 MED ORDER — GABAPENTIN 400 MG PO CAPS
1200.0000 mg | ORAL_CAPSULE | Freq: Two times a day (BID) | ORAL | Status: DC
  Administered 2014-12-04 – 2014-12-06 (×5): 1200 mg via ORAL
  Filled 2014-12-04 (×5): qty 3

## 2014-12-04 MED ORDER — CETYLPYRIDINIUM CHLORIDE 0.05 % MT LIQD
7.0000 mL | Freq: Two times a day (BID) | OROMUCOSAL | Status: DC
  Administered 2014-12-05 – 2014-12-06 (×2): 7 mL via OROMUCOSAL

## 2014-12-04 MED ORDER — IPRATROPIUM-ALBUTEROL 0.5-2.5 (3) MG/3ML IN SOLN
3.0000 mL | Freq: Four times a day (QID) | RESPIRATORY_TRACT | Status: DC
  Administered 2014-12-04 – 2014-12-06 (×9): 3 mL via RESPIRATORY_TRACT
  Filled 2014-12-04 (×9): qty 3

## 2014-12-04 MED ORDER — ALPRAZOLAM 1 MG PO TABS
2.0000 mg | ORAL_TABLET | Freq: Three times a day (TID) | ORAL | Status: DC | PRN
  Administered 2014-12-04 – 2014-12-06 (×5): 2 mg via ORAL
  Filled 2014-12-04: qty 2
  Filled 2014-12-04: qty 4
  Filled 2014-12-04: qty 2
  Filled 2014-12-04 (×2): qty 4

## 2014-12-04 MED ORDER — SODIUM CHLORIDE 0.9 % IJ SOLN
3.0000 mL | Freq: Two times a day (BID) | INTRAMUSCULAR | Status: DC
  Administered 2014-12-05 (×2): 3 mL via INTRAVENOUS

## 2014-12-04 MED ORDER — ACETAMINOPHEN 650 MG RE SUPP: 650.0000 mg | Freq: Four times a day (QID) | RECTAL | Status: DC | PRN

## 2014-12-04 MED ORDER — DILTIAZEM HCL ER COATED BEADS 240 MG PO CP24: 240.0000 mg | ORAL_CAPSULE | Freq: Every day | ORAL | Status: DC

## 2014-12-04 MED ORDER — SODIUM CHLORIDE 0.9 % IV BOLUS (SEPSIS)
500.0000 mL | Freq: Once | INTRAVENOUS | Status: AC
  Administered 2014-12-04: 500 mL via INTRAVENOUS

## 2014-12-04 MED ORDER — TIZANIDINE HCL 4 MG PO TABS
4.0000 mg | ORAL_TABLET | Freq: Three times a day (TID) | ORAL | Status: DC | PRN
  Filled 2014-12-04: qty 1

## 2014-12-04 MED ORDER — POTASSIUM CHLORIDE ER 10 MEQ PO TBCR: 20.0000 meq | EXTENDED_RELEASE_TABLET | Freq: Two times a day (BID) | ORAL | Status: DC

## 2014-12-04 MED ORDER — SODIUM CHLORIDE 0.9 % IV BOLUS (SEPSIS)
1000.0000 mL | Freq: Once | INTRAVENOUS | Status: AC
  Administered 2014-12-04: 1000 mL via INTRAVENOUS

## 2014-12-04 MED ORDER — PANTOPRAZOLE SODIUM 40 MG PO TBEC
40.0000 mg | DELAYED_RELEASE_TABLET | Freq: Two times a day (BID) | ORAL | Status: DC
  Administered 2014-12-04 – 2014-12-06 (×5): 40 mg via ORAL
  Filled 2014-12-04 (×5): qty 1

## 2014-12-04 MED ORDER — MECLIZINE HCL 12.5 MG PO TABS: 25.0000 mg | ORAL_TABLET | Freq: Three times a day (TID) | ORAL | Status: DC | PRN

## 2014-12-04 MED ORDER — SODIUM CHLORIDE 0.9 % IJ SOLN
3.0000 mL | Freq: Two times a day (BID) | INTRAMUSCULAR | Status: DC
  Administered 2014-12-04 – 2014-12-05 (×2): 3 mL via INTRAVENOUS

## 2014-12-04 MED ORDER — LEVOFLOXACIN 500 MG PO TABS
500.0000 mg | ORAL_TABLET | ORAL | Status: DC
  Administered 2014-12-04 – 2014-12-06 (×3): 500 mg via ORAL
  Filled 2014-12-04 (×3): qty 1

## 2014-12-04 MED ORDER — INSULIN ASPART 100 UNIT/ML ~~LOC~~ SOLN: 0.0000 [IU] | Freq: Three times a day (TID) | SUBCUTANEOUS | Status: DC

## 2014-12-04 MED ORDER — DIPHENHYDRAMINE HCL 25 MG PO CAPS: 25.0000 mg | ORAL_CAPSULE | Freq: Four times a day (QID) | ORAL | Status: DC | PRN

## 2014-12-04 NOTE — ED Notes (Signed)
Pt arrived by EMS. Per EMS pt was alert & talking to them. upon arrival pt was lethargic. Pt would respond to painfum stimuli.

## 2014-12-04 NOTE — ED Notes (Signed)
Patient originally called EMS for chest pain that started today and was complaining of increasing swelling to legs that started Friday. Patient was alert and talking to EMS when they arrived. Patient now lethargic.

## 2014-12-04 NOTE — ED Notes (Signed)
Pt assisted to bed side commode, pt says unable to urinate. EDP notified.

## 2014-12-04 NOTE — H&P (Signed)
History and Physical  Stephanie Combs QPY:[id number] DOB: 05/01/1951 DOA: 12/04/2014  Referring physician: Dr. Rolland Porter in ED PCP: Neale Burly, MD   Chief Complaint: Low blood sugar  HPI:  63yow presented to ED with chest pain, low blood sugar. Initial evaluation with unremarkable CXR, nonacute EKG. AKI, hypotension that responded to IVF bolus. Referred for admission for AKI, hypotension, acute encephalopathy, chest pain.  History obtained from patient. Tangential, requires frequent redirection. Reports multiple complaints. She was compliant with her discharge medications including Lasix but reports low urine output. Chest pain pressure x 1 month, worse in the last 24 hours a pressure-like sensation "hurts a lot" no specific aggravating or alleviating factors, currently better. Nausea without vomiting, no abdominal pain. Slight SOB Low CBG she checked her blood sugars this morning and they were in the 30s. She ate a Snickers but it failed to improve so she called 911. She has been drinking a lot of water.  Discharge 7/19, treated for acute on chronic diastolic heart failure, acute kidney injury superimposed on chronic kidney disease stage III, left lower extremity cellulitis,  In the emergency department intermittent hypotension with most recent blood pressure 119/79. Normal heart rate. No tachypnea. Afebrile. Stable oxygenation on 2 L. Treated with 1.5 L bolus. Pertinent labs: Blood sugar 71. Creatinine 3, BUN 61 well above baseline. Anion gap 9. LFTs unremarkable. BNP 50, troponin negative, lactic acid within normal limits, hemoglobin 11.2.urinalysis unremarkable. EKG: Independently reviewed. Sinus rhythm, no acute changes. Imaging: independently reviewed. Mild cardiomegaly on chest x-ray. No acute changes.  Review of Systems:  Negative for fever, visual changes, rash, new muscle aches, dysuria, bleeding, vomiting.  Endorses chills, sore throat, difficulty urinating, nausea,    Past Medical History  Diagnosis Date  . Hypertension   . Diabetes mellitus without complication   . COPD (chronic obstructive pulmonary disease)   . CHF (congestive heart failure)     Diastolic  . Anxiety   . Chronic back pain   . Hypothyroidism   . Pedal edema   . On home O2     2.5 L N/C prn  . Degenerative disk disease   . Boil 01/10/2014    Past Surgical History  Procedure Laterality Date  . Tubal ligation    . Hernia repair      Social History:  reports that she has been smoking Cigarettes.  She has been smoking about 0.25 packs per day. She has never used smokeless tobacco. She reports that she does not drink alcohol or use illicit drugs. lives with friend, family helps Partial assistance  Allergies  Allergen Reactions  . Dilaudid [Hydromorphone Hcl] Itching  . Actifed Cold-Allergy [Chlorpheniramine-Phenylephrine]     "I was sick and red and it didn't agree with me at all"  . Other Cough    Pt states she is allergic to ragweed and that she starts coughing and sneezing like crazy  . Penicillins Hives and Itching  . Reglan [Metoclopramide] Itching  . Valium [Diazepam] Itching  . Vistaril [Hydroxyzine Hcl] Itching    Family History  Problem Relation Age of Onset  . Stroke Mother   . Heart attack Father   . Diabetes Brother   . Cerebral palsy Brother   . Pneumonia Brother   . Diabetes Other   . Heart attack Other      Prior to Admission medications   Medication Sig Start Date End Date Taking? Authorizing Provider  albuterol (PROAIR HFA) 108 (90 BASE) MCG/ACT inhaler Inhale 1  puff into the lungs every 4 (four) hours as needed. For shortness of breath Patient taking differently: Inhale 2 puffs into the lungs every 4 (four) hours as needed. For shortness of breath 05/30/13   Kinnie Feil, MD  alprazolam Duanne Moron) 2 MG tablet Take 1 tablet (2 mg total) by mouth 3 (three) times daily as needed for anxiety. 11/10/14   Radene Gunning, NP  amoxicillin-clavulanate  (AUGMENTIN) 875-125 MG per tablet Take 1 tablet by mouth every 12 (twelve) hours. For 3 more days 11/28/14   Radene Gunning, NP  aspirin EC 81 MG tablet Take 81 mg by mouth daily.    Historical Provider, MD  benzonatate (TESSALON) 100 MG capsule Take 1 capsule (100 mg total) by mouth 3 (three) times daily as needed for cough. Patient taking differently: Take 200 mg by mouth 2 (two) times daily as needed for cough.  12/31/13   Jola Schmidt, MD  Bisacodyl (FLEET LAXATIVE RE) Place 1 application rectally daily as needed (constipation).    Historical Provider, MD  diltiazem (CARDIZEM CD) 240 MG 24 hr capsule Take 1 capsule (240 mg total) by mouth daily. 05/30/13   Kinnie Feil, MD  diphenhydrAMINE (BENADRYL) 25 MG tablet Take 25 mg by mouth every 6 (six) hours as needed for allergies.    Historical Provider, MD  empagliflozin (JARDIANCE) 25 MG TABS tablet Take 25 mg by mouth daily.    Historical Provider, MD  furosemide (LASIX) 20 MG tablet Take 4 tablets (80 mg total) by mouth 2 (two) times daily. 11/10/14   Radene Gunning, NP  gabapentin (NEURONTIN) 600 MG tablet Take 1 tablet (600 mg total) by mouth 4 (four) times daily. Patient taking differently: Take 1,200 mg by mouth 2 (two) times daily.  05/30/13   Kinnie Feil, MD  insulin aspart (NOVOLOG) 100 UNIT/ML injection Inject 0-20 Units into the skin 3 (three) times daily with meals. Takes 20 Units in am and 20 Units at night.  Per sliding scale 05/30/13   Kinnie Feil, MD  insulin detemir (LEVEMIR) 100 UNIT/ML injection Inject 40 Units into the skin 2 (two) times daily. Takes 40 Units in am and 40 Units at night.    Historical Provider, MD  ipratropium-albuterol (DUONEB) 0.5-2.5 (3) MG/3ML SOLN Take 3 mLs by nebulization every 6 (six) hours. 05/30/13   Kinnie Feil, MD  levofloxacin (LEVAQUIN) 500 MG tablet Take 1 tablet (500 mg total) by mouth daily. 11/28/14   Radene Gunning, NP  levothyroxine (SYNTHROID, LEVOTHROID) 125 MCG tablet Take 2 tablets  (250 mcg total) by mouth daily before breakfast. 05/30/13   Kinnie Feil, MD  meclizine (ANTIVERT) 25 MG tablet Take 25 mg by mouth 3 (three) times daily as needed for dizziness.  04/06/13   Historical Provider, MD  metFORMIN (GLUCOPHAGE) 500 MG tablet Take 500 mg by mouth 2 (two) times daily with a meal.    Historical Provider, MD  naproxen sodium (ANAPROX) 220 MG tablet Take 440 mg by mouth 2 (two) times daily as needed (headache).    Historical Provider, MD  nicotine (NICODERM CQ - DOSED IN MG/24 HOURS) 21 mg/24hr patch Place 1 patch (21 mg total) onto the skin daily. 05/30/13   Kinnie Feil, MD  nystatin (MYCOSTATIN/NYSTOP) 100000 UNIT/GM POWD Apply 1 g topically 2 (two) times daily.    Historical Provider, MD  Omega-3 Fatty Acids (FISH OIL PO) Take 1 capsule by mouth 2 (two) times daily.     Historical  Provider, MD  ondansetron (ZOFRAN ODT) 8 MG disintegrating tablet Take 1 tablet (8 mg total) by mouth every 8 (eight) hours as needed for nausea or vomiting. 12/31/13   Jola Schmidt, MD  OVER THE COUNTER MEDICATION Apply 1 application topically daily.    Historical Provider, MD  oxyCODONE-acetaminophen (PERCOCET) 10-325 MG per tablet Take 1 tablet by mouth every 4 (four) hours as needed for pain. 11/10/14   Radene Gunning, NP  pantoprazole (PROTONIX) 40 MG tablet Take 1 tablet (40 mg total) by mouth 2 (two) times daily. 11/10/14   Radene Gunning, NP  potassium chloride (K-DUR) 10 MEQ tablet Take 20 mEq by mouth 2 (two) times daily.     Historical Provider, MD  tiZANidine (ZANAFLEX) 4 MG tablet Take 4 mg by mouth 4 (four) times daily.    Historical Provider, MD   Physical Exam: Filed Vitals:   12/04/14 0530 12/04/14 0600 12/04/14 0640 12/04/14 0700  BP: '86/61 93/49 96/52 '$ 111/79  Pulse: 71 72 72 75  Resp: '22 21 20 23  '$ Weight:      SpO2: 99% 100% 97% 100%   General: Appears calm and comfortable Eyes: PERRL, normal lids, irises  ENT: grossly normal hearing, lips & tongue Neck: no LAD, masses  or thyromegaly Cardiovascular: RRR, no m/r/g. 2-3+ LE edema. Respiratory:faint wheeze, no r/r. Decreased breath sounds. Normal respiratory effort. Speaks in paragraphs. Abdomen: soft, ntnd, obese Skin: slight erythema right foot, ulcer right 1st great without evidence of complicating features Musculoskeletal: grossly normal tone BUE/BLE Psychiatric: grossly normal mood and affect, speech fluent and appropriate Neurologic: grossly non-focal.  Wt Readings from Last 3 Encounters:  12/04/14 125.646 kg (277 lb)  11/28/14 125.9 kg (277 lb 9 oz)  11/10/14 125.329 kg (276 lb 4.8 oz)    Labs on Admission:  Basic Metabolic Panel:  Recent Labs Lab 11/28/14 0526 12/04/14 0456 12/04/14 0509  NA 136 135 135  K 3.6 4.5 4.4  CL 95* 96* 95*  CO2 32 30  --   GLUCOSE 286* 74 89  BUN 29* 61* 58*  CREATININE 1.35* 3.00* 2.90*  CALCIUM 8.1* 8.7*  --     Liver Function Tests:  Recent Labs Lab 12/04/14 0456  AST 16  ALT 15  ALKPHOS 57  BILITOT 0.3  PROT 6.1*  ALBUMIN 3.2*   CBC:  Recent Labs Lab 12/04/14 0456 12/04/14 0509  WBC 10.2  --   NEUTROABS 5.4  --   HGB 10.2* 11.2*  HCT 32.6* 33.0*  MCV 83.4  --   PLT 261  --     Cardiac Enzymes:  Recent Labs Lab 12/04/14 0456  TROPONINI <0.03   CBG:  Recent Labs Lab 11/27/14 1607 11/27/14 2109 11/28/14 0734 11/28/14 1108 12/04/14 0452  GLUCAP 161* 227* 205* 144* 71     Radiological Exams on Admission: Dg Chest Port 1 View  12/04/2014   CLINICAL DATA:  Altered mental status. Intermittent midsternal chest pain starting today. Bilateral leg swelling and weakness.  EXAM: PORTABLE CHEST - 1 VIEW  COMPARISON:  Most recent chest radiograph 11/24/2014  FINDINGS: Lung volumes remain low. The patient is rotated to the left. Cardiomediastinal contours are unchanged with mild cardiomegaly. No evidence of pulmonary edema, confluent airspace disease, pleural effusion or pneumothorax. Minimal left basilar atelectasis.  IMPRESSION:  Hypoventilatory chest with stable mild cardiomegaly.   Electronically Signed   By: Jeb Levering M.D.   On: 12/04/2014 06:04      Principal Problem:   AKI (acute  kidney injury) Active Problems:   Hypotension   COPD (chronic obstructive pulmonary disease)   Morbid obesity   DM type 2 (diabetes mellitus, type 2)   CKD (chronic kidney disease), stage III   Chronic diastolic CHF (congestive heart failure)   Anemia in CKD (chronic kidney disease)   Chest pain   Foot ulcer due to secondary DM   Acute encephalopathy   Assessment/Plan 1. AKI superimposed on CKD stage III, etiology unclear, consider NSAID. Consider diuretic versus urinary retention/postrenal obstruction, cardiorenal syndrome seems less likely. 2. Hypotension with normal lactic acid responded to IVF. U/A and CXR clear. Troponin and BNP unremarkable. Suspect related to Lasix. There is no evidence to suggest sepsis, acute coronary syndrome or severe illness. 3. Acute encephalopathy. Resolved. Likely metabolic, secondary to hypoglycemia. 4. Urinary retention? She reports difficulty urinating at home. Workup as below. 5. Chest pain. Described as a pressure-like sensation present for more than one month with acute worsening in the last 24 hours. Initial troponin and EKG unremarkable. 6. Anemia of CKD, stable. 7. DM type 2 with hypoglycemia on admission, asymptomatic. Hold empagliflozin. HgbA1c 8.5, 10/2014. Stop metformin. 8. Chronic diastolic congestive heart failure. She has bilateral lower extremity edema but chest is clear and there is no radiographic evidence of volume overload. 9. COPD, chronic hypoxic respiratory failure on 2 L [place], appears stable. 10. Morbid obesity. BMI 51.3. 11. Recent cellulitis left lower extremity. Continue outpatient antibiotics for right diabetic toe ulcer. No evidence of sepsis.   Plan admission to stepdown unit.  Judicious IV fluids, check bladder scan, renal ultrasound. Urinalysis was  unremarkable. Hold Lasix for now.  Trend troponin.   Follow blood sugars closely. Advance diet. hold insulin for now.   Continue supplemental oxygen.   Consider nephrology consultation if fails to improve.  Code Status: full  DVT prophylaxis:full code Family Communication: none present Disposition Plan/Anticipated LOS: observation, 1-2 days  Time spent: 42 minutes  Murray Hodgkins, MD  [facility] Pager [phone] 12/04/2014, 7:26 AM

## 2014-12-04 NOTE — Progress Notes (Signed)
Patient tearful and states she feels depressed. Patient requesting Xanax.

## 2014-12-04 NOTE — ED Provider Notes (Signed)
CSN: [id number]     Arrival date & time 12/04/14  0450 History   First MD Initiated Contact with Patient 12/04/14 [id number]     Chief Complaint  Patient presents with  . Chest Pain  . Altered Mental Status   Level V caveat for altered mental status  (Consider location/radiation/quality/duration/timing/severity/associated sxs/prior Treatment) HPI  Per EMS they were called for chest pain. He also reports she said she had a CBG in the 30s. EMS reports she talked to them all the way to the ED. Nursing staff states she was initially talking when she arrived. However at the time I went in to see the patient she was nonverbal. She would open her eyes to deep sternal rub and mumbled. Her initial blood pressure was 70/39.  PCP Dr.Hasanaj in [place]     Past Medical History  Diagnosis Date  . Hypertension   . Diabetes mellitus without complication   . COPD (chronic obstructive pulmonary disease)   . CHF (congestive heart failure)     Diastolic  . Anxiety   . Chronic back pain   . Hypothyroidism   . Pedal edema   . On home O2     2.5 L N/C prn  . Degenerative disk disease   . Boil 01/10/2014   Past Surgical History  Procedure Laterality Date  . Tubal ligation    . Hernia repair     Family History  Problem Relation Age of Onset  . Stroke Mother   . Heart attack Father   . Diabetes Brother   . Cerebral palsy Brother   . Pneumonia Brother   . Diabetes Other   . Heart attack Other    History  Substance Use Topics  . Smoking status: Current Every Day Smoker -- 0.25 packs/day    Types: Cigarettes  . Smokeless tobacco: Never Used  . Alcohol Use: No   Lives at home  OB History    No data available     Review of Systems  Unable to perform ROS: Mental status change      Allergies  Dilaudid; Actifed cold-allergy; Other; Penicillins; Reglan; Valium; and Vistaril  Home Medications   Prior to Admission medications   Medication Sig Start Date End Date Taking? Authorizing Provider   albuterol (PROAIR HFA) 108 (90 BASE) MCG/ACT inhaler Inhale 1 puff into the lungs every 4 (four) hours as needed. For shortness of breath Patient taking differently: Inhale 2 puffs into the lungs every 4 (four) hours as needed. For shortness of breath 05/30/13   Kinnie Feil, MD  alprazolam Duanne Moron) 2 MG tablet Take 1 tablet (2 mg total) by mouth 3 (three) times daily as needed for anxiety. 11/10/14   Radene Gunning, NP  amoxicillin-clavulanate (AUGMENTIN) 875-125 MG per tablet Take 1 tablet by mouth every 12 (twelve) hours. For 3 more days 11/28/14   Radene Gunning, NP  aspirin EC 81 MG tablet Take 81 mg by mouth daily.    Historical Provider, MD  benzonatate (TESSALON) 100 MG capsule Take 1 capsule (100 mg total) by mouth 3 (three) times daily as needed for cough. Patient taking differently: Take 200 mg by mouth 2 (two) times daily as needed for cough.  12/31/13   Jola Schmidt, MD  Bisacodyl (FLEET LAXATIVE RE) Place 1 application rectally daily as needed (constipation).    Historical Provider, MD  diltiazem (CARDIZEM CD) 240 MG 24 hr capsule Take 1 capsule (240 mg total) by mouth daily. 05/30/13   Arie Sabina  Buriev, MD  diphenhydrAMINE (BENADRYL) 25 MG tablet Take 25 mg by mouth every 6 (six) hours as needed for allergies.    Historical Provider, MD  empagliflozin (JARDIANCE) 25 MG TABS tablet Take 25 mg by mouth daily.    Historical Provider, MD  furosemide (LASIX) 20 MG tablet Take 4 tablets (80 mg total) by mouth 2 (two) times daily. 11/10/14   Radene Gunning, NP  gabapentin (NEURONTIN) 600 MG tablet Take 1 tablet (600 mg total) by mouth 4 (four) times daily. Patient taking differently: Take 1,200 mg by mouth 2 (two) times daily.  05/30/13   Kinnie Feil, MD  insulin aspart (NOVOLOG) 100 UNIT/ML injection Inject 0-20 Units into the skin 3 (three) times daily with meals. Takes 20 Units in am and 20 Units at night.  Per sliding scale 05/30/13   Kinnie Feil, MD  insulin detemir (LEVEMIR) 100  UNIT/ML injection Inject 40 Units into the skin 2 (two) times daily. Takes 40 Units in am and 40 Units at night.    Historical Provider, MD  ipratropium-albuterol (DUONEB) 0.5-2.5 (3) MG/3ML SOLN Take 3 mLs by nebulization every 6 (six) hours. 05/30/13   Kinnie Feil, MD  levofloxacin (LEVAQUIN) 500 MG tablet Take 1 tablet (500 mg total) by mouth daily. 11/28/14   Radene Gunning, NP  levothyroxine (SYNTHROID, LEVOTHROID) 125 MCG tablet Take 2 tablets (250 mcg total) by mouth daily before breakfast. 05/30/13   Kinnie Feil, MD  meclizine (ANTIVERT) 25 MG tablet Take 25 mg by mouth 3 (three) times daily as needed for dizziness.  04/06/13   Historical Provider, MD  metFORMIN (GLUCOPHAGE) 500 MG tablet Take 500 mg by mouth 2 (two) times daily with a meal.    Historical Provider, MD  naproxen sodium (ANAPROX) 220 MG tablet Take 440 mg by mouth 2 (two) times daily as needed (headache).    Historical Provider, MD  nicotine (NICODERM CQ - DOSED IN MG/24 HOURS) 21 mg/24hr patch Place 1 patch (21 mg total) onto the skin daily. 05/30/13   Kinnie Feil, MD  nystatin (MYCOSTATIN/NYSTOP) 100000 UNIT/GM POWD Apply 1 g topically 2 (two) times daily.    Historical Provider, MD  Omega-3 Fatty Acids (FISH OIL PO) Take 1 capsule by mouth 2 (two) times daily.     Historical Provider, MD  ondansetron (ZOFRAN ODT) 8 MG disintegrating tablet Take 1 tablet (8 mg total) by mouth every 8 (eight) hours as needed for nausea or vomiting. 12/31/13   Jola Schmidt, MD  OVER THE COUNTER MEDICATION Apply 1 application topically daily.    Historical Provider, MD  oxyCODONE-acetaminophen (PERCOCET) 10-325 MG per tablet Take 1 tablet by mouth every 4 (four) hours as needed for pain. 11/10/14   Radene Gunning, NP  pantoprazole (PROTONIX) 40 MG tablet Take 1 tablet (40 mg total) by mouth 2 (two) times daily. 11/10/14   Radene Gunning, NP  potassium chloride (K-DUR) 10 MEQ tablet Take 20 mEq by mouth 2 (two) times daily.     Historical  Provider, MD  tiZANidine (ZANAFLEX) 4 MG tablet Take 4 mg by mouth 4 (four) times daily.    Historical Provider, MD   ED Triage Vitals  Enc Vitals Group     BP 12/04/14 0454 70/39 mmHg     Pulse Rate 12/04/14 0454 69     Resp 12/04/14 0454 20     Temp --      Temp src --      SpO2  12/04/14 0454 97 %     Weight 12/04/14 0454 277 lb (125.646 kg)     Height --      Head Cir --      Peak Flow --      Pain Score 12/04/14 0454 0     Pain Loc --      Pain Edu? --      Excl. in [place]? --    Vital signs normal except for hypotension   Physical Exam  Constitutional: She appears well-developed and well-nourished.  Non-toxic appearance. She does not appear ill. No distress.  obese  HENT:  Head: Normocephalic and atraumatic.  Right Ear: External ear normal.  Left Ear: External ear normal.  Nose: Nose normal. No mucosal edema or rhinorrhea.  Mouth/Throat: Oropharynx is clear and moist and mucous membranes are normal. No dental abscesses or uvula swelling.  Eyes: Conjunctivae and EOM are normal. Pupils are equal, round, and reactive to light.  Neck: Normal range of motion and full passive range of motion without pain. Neck supple.  Cardiovascular: Normal rate, regular rhythm and normal heart sounds.  Exam reveals no gallop and no friction rub.   No murmur heard. Pulmonary/Chest: Effort normal and breath sounds normal. No respiratory distress. She has no wheezes. She has no rhonchi. She has no rales. She exhibits no tenderness and no crepitus.  Abdominal: Soft. Normal appearance and bowel sounds are normal. She exhibits no distension. There is no tenderness. There is no rebound and no guarding.  Musculoskeletal: Normal range of motion. She exhibits edema. She exhibits no tenderness.  Legs appear edematous with 1+ pitting edema. She has some mild diffuse redness of her left lower leg.  Neurological: She has normal strength. No cranial nerve deficit.  Patient is lethargic and responds minimally  to sternal rub she  Skin: Skin is warm, dry and intact. No rash noted. No erythema. No pallor.  Psychiatric: She has a normal mood and affect. Her speech is normal and behavior is normal. Her mood appears not anxious.  Nursing note and vitals reviewed.   ED Course  Procedures (including critical care time)  Medications  sodium chloride 0.9 % bolus 1,000 mL (1,000 mLs Intravenous Rate/Dose Change 12/04/14 0533)  sodium chloride 0.9 % bolus 500 mL (500 mLs Intravenous Bolus from Bag 12/04/14 0634)    5:15 AM. Patient is now sitting in the stretcher wide-awake. Her blood pressure is now 127/87 after about a 700 mL bolus of fluid. Patient keeps saying "I'm scared for myself". When I ask her why she states she is buried almost all of her family members. She states yesterday she had trouble sleeping and she slept until  2 PM in the afternoon. She states this morning she took her medication at 1 AM. She reports she started feeling bad during the night with a "pain, sickness, and pressure" and points to the right side of her chest. She states she's never had it before. She states her CBGs were low. She states the initial one was 32, she states she ate a stickers bar in one up to 42. She was just discharged from the hospital on July 19 with exacerbation of congestive heart failure. She reports she has been having shortness of breath and has to sit up to breathe. She denies diaphoresis. She did have a dry cough today and chills but no fever. She states her legs have been getting more swollen and her abdomen is been getting tight and swollen the day after  she left the hospital. She states yesterday she ate boiled steamed cababage, ribs marinated in [employer] sauce and she ate the snickers when her CBG was low.   6:30 AM patient remains awake. Her blood pressure is however starting to drop slightly into the 80 and 90 range. She was given another 500 mL bolus of fluid. My plan is to do small bolus's  frequently so we do not put her in overt congestive heart failure exacerbation.  07:30 BP improved after 500 cc bolus of NS  07:35 Dr Sarajane Jews, admit to obs, stepdown  Labs Review Results for orders placed or performed during the hospital encounter of 12/04/14  Comprehensive metabolic panel  Result Value Ref Range   Sodium 135 135 - 145 mmol/L   Potassium 4.5 3.5 - 5.1 mmol/L   Chloride 96 (L) 101 - 111 mmol/L   CO2 30 22 - 32 mmol/L   Glucose, Bld 74 65 - 99 mg/dL   BUN 61 (H) 6 - 20 mg/dL   Creatinine, Ser 3.00 (H) 0.44 - 1.00 mg/dL   Calcium 8.7 (L) 8.9 - 10.3 mg/dL   Total Protein 6.1 (L) 6.5 - 8.1 g/dL   Albumin 3.2 (L) 3.5 - 5.0 g/dL   AST 16 15 - 41 U/L   ALT 15 14 - 54 U/L   Alkaline Phosphatase 57 38 - 126 U/L   Total Bilirubin 0.3 0.3 - 1.2 mg/dL   GFR calc non Af Amer 15 (L) >60 mL/min   GFR calc Af Amer 18 (L) >60 mL/min   Anion gap 9 5 - 15  CBC with Differential  Result Value Ref Range   WBC 10.2 4.0 - 10.5 K/uL   RBC 3.91 3.87 - 5.11 MIL/uL   Hemoglobin 10.2 (L) 12.0 - 15.0 g/dL   HCT 32.6 (L) 36.0 - 46.0 %   MCV 83.4 78.0 - 100.0 fL   MCH 26.1 26.0 - 34.0 pg   MCHC 31.3 30.0 - 36.0 g/dL   RDW 17.4 (H) 11.5 - 15.5 %   Platelets 261 150 - 400 K/uL   Neutrophils Relative % 53 43 - 77 %   Neutro Abs 5.4 1.7 - 7.7 K/uL   Lymphocytes Relative 29 12 - 46 %   Lymphs Abs 3.0 0.7 - 4.0 K/uL   Monocytes Relative 10 3 - 12 %   Monocytes Absolute 1.1 (H) 0.1 - 1.0 K/uL   Eosinophils Relative 6 (H) 0 - 5 %   Eosinophils Absolute 0.6 0.0 - 0.7 K/uL   Basophils Relative 2 (H) 0 - 1 %   Basophils Absolute 0.2 (H) 0.0 - 0.1 K/uL  Troponin I  Result Value Ref Range   Troponin I <0.03 <0.031 ng/mL  Lactic acid, plasma  Result Value Ref Range   Lactic Acid, Venous 1.1 0.5 - 2.0 mmol/L  Brain natriuretic peptide  Result Value Ref Range   B Natriuretic Peptide 50.0 0.0 - 100.0 pg/mL  CBG monitoring, ED  Result Value Ref Range   Glucose-Capillary 71 65 - 99 mg/dL   I-stat Chem 8, ED  Result Value Ref Range   Sodium 135 135 - 145 mmol/L   Potassium 4.4 3.5 - 5.1 mmol/L   Chloride 95 (L) 101 - 111 mmol/L   BUN 58 (H) 6 - 20 mg/dL   Creatinine, Ser 2.90 (H) 0.44 - 1.00 mg/dL   Glucose, Bld 89 65 - 99 mg/dL   Calcium, Ion 1.19 1.13 - 1.30 mmol/L   TCO2 29  0 - 100 mmol/L   Hemoglobin 11.2 (L) 12.0 - 15.0 g/dL   HCT 33.0 (L) 36.0 - 46.0 %    Laboratory interpretation all normal except acute worsening of her baseline renal insufficiency, anemia   Imaging Review  Dg Chest Port 1 View  12/04/2014   CLINICAL DATA:  Altered mental status. Intermittent midsternal chest pain starting today. Bilateral leg swelling and weakness.  EXAM: PORTABLE CHEST - 1 VIEW  COMPARISON:  Most recent chest radiograph 11/24/2014  FINDINGS: Lung volumes remain low. The patient is rotated to the left. Cardiomediastinal contours are unchanged with mild cardiomegaly. No evidence of pulmonary edema, confluent airspace disease, pleural effusion or pneumothorax. Minimal left basilar atelectasis.  IMPRESSION: Hypoventilatory chest with stable mild cardiomegaly.   Electronically Signed   By: Jeb Levering M.D.   On: 12/04/2014 06:04    Mr Foot Right Wo Contrast  11/09/2014   CLINICAL DATA:  Wound on the right great toe for several weeks. Diabetic patient. Initial encounter.    IMPRESSION: Wound along the medial aspect of the right great toe. No abscess is identified. Mild marrow edema in the distal phalanx of the great toe is worrisome for osteomyelitis but could reflect reactive change.  Intense subcutaneous edema over the dorsum of the foot.  Atrophy of the intrinsic musculature of the foot.   Electronically Signed   By: Inge Rise M.D.   On: 11/09/2014 10:55   US Venous Img Lower Unilateral Right  11/28/2014   CLINICAL DATA:  Right lower extremity swelling.    IMPRESSION: No evidence of deep venous thrombosis seen in right lower extremity.   Electronically Signed   By: Marijo Conception, M.D.   On: 11/28/2014 14:44   US Arterial Seg Multiple  11/09/2014   CLINICAL DATA:  Right great toe diabetic ulcer for 5 weeks. Peripheral vascular disease, leg pain concern for claudication.  .  IMPRESSION: ABIs greater than 1 bilaterally, suspect falsely elevated related to peripheral atherosclerosis. No significant posterior tibial or dorsalis pedis waveform abnormality at either ankle. Symmetric pulse volume recordings.   Electronically Signed   By: Jerilynn Mages.  Shick M.D.   On: 11/09/2014 15:18      EKG Interpretation   Date/Time:  Monday December 04 2014 04:59:21 EDT Ventricular Rate:  71 PR Interval:  167 QRS Duration: 90 QT Interval:  404 QTC Calculation: 439 R Axis:   88 Text Interpretation:  Sinus rhythm Borderline right axis deviation  Baseline wander in lead(s) V3 Since last tracing 25 Nov 2014 I waves more  peaked Confirmed by Banesa Tristan  MD-I, Selby Foisy ([id number]) on 12/04/2014 5:07:42 AM      MDM   Final diagnoses:  Altered mental status, unspecified altered mental status type  Other specified hypotension  Acute on chronic renal insufficiency  Peripheral edema   Plan admission   Rolland Porter, MD, Barbette Or, MD 12/04/14 [phone]

## 2014-12-04 NOTE — ED Notes (Signed)
Pt alert speaking to EDP. States sugar was low tonight 36 per pt she corrected EMS states 71 when they to CBG. Pt says did not feel right tonight when she called EMS.

## 2014-12-05 DIAGNOSIS — E13621 Other specified diabetes mellitus with foot ulcer: Secondary | ICD-10-CM

## 2014-12-05 DIAGNOSIS — R079 Chest pain, unspecified: Secondary | ICD-10-CM | POA: Diagnosis not present

## 2014-12-05 DIAGNOSIS — N179 Acute kidney failure, unspecified: Secondary | ICD-10-CM | POA: Diagnosis not present

## 2014-12-05 DIAGNOSIS — L97509 Non-pressure chronic ulcer of other part of unspecified foot with unspecified severity: Secondary | ICD-10-CM

## 2014-12-05 DIAGNOSIS — G9340 Encephalopathy, unspecified: Secondary | ICD-10-CM | POA: Diagnosis not present

## 2014-12-05 DIAGNOSIS — N189 Chronic kidney disease, unspecified: Secondary | ICD-10-CM | POA: Diagnosis not present

## 2014-12-05 LAB — BASIC METABOLIC PANEL
Anion gap: 10 (ref 5–15)
BUN: 43 mg/dL — ABNORMAL HIGH (ref 6–20)
CO2: 24 mmol/L (ref 22–32)
Calcium: 8.3 mg/dL — ABNORMAL LOW (ref 8.9–10.3)
Chloride: 102 mmol/L (ref 101–111)
Creatinine, Ser: 1.8 mg/dL — ABNORMAL HIGH (ref 0.44–1.00)
GFR calc Af Amer: 33 mL/min — ABNORMAL LOW (ref >60)
GFR calc non Af Amer: 29 mL/min — ABNORMAL LOW (ref >60)
Glucose, Bld: 205 mg/dL — ABNORMAL HIGH (ref 65–99)
Potassium: 5.2 mmol/L — ABNORMAL HIGH (ref 3.5–5.1)
Sodium: 136 mmol/L (ref 135–145)

## 2014-12-05 LAB — GLUCOSE, CAPILLARY
Glucose-Capillary: 180 mg/dL — ABNORMAL HIGH (ref 65–99)
Glucose-Capillary: 253 mg/dL — ABNORMAL HIGH (ref 65–99)
Glucose-Capillary: 196 mg/dL — ABNORMAL HIGH (ref 65–99)
Glucose-Capillary: 369 mg/dL — ABNORMAL HIGH (ref 65–99)
Glucose-Capillary: 406 mg/dL — ABNORMAL HIGH (ref 65–99)

## 2014-12-05 LAB — CBC
HCT: 31.9 % — ABNORMAL LOW (ref 36.0–46.0)
Hemoglobin: 10 g/dL — ABNORMAL LOW (ref 12.0–15.0)
MCH: 26.2 pg (ref 26.0–34.0)
MCHC: 31.3 g/dL (ref 30.0–36.0)
MCV: 83.5 fL (ref 78.0–100.0)
Platelets: 215 10*3/uL (ref 150–400)
RBC: 3.82 MIL/uL — ABNORMAL LOW (ref 3.87–5.11)
RDW: 17.3 % — ABNORMAL HIGH (ref 11.5–15.5)
WBC: 6.8 10*3/uL (ref 4.0–10.5)

## 2014-12-05 MED ORDER — INSULIN ASPART 100 UNIT/ML ~~LOC~~ SOLN
0.0000 [IU] | Freq: Every day | SUBCUTANEOUS | Status: DC
  Administered 2014-12-05: 5 [IU] via SUBCUTANEOUS

## 2014-12-05 MED ORDER — INSULIN DETEMIR 100 UNIT/ML ~~LOC~~ SOLN
20.0000 [IU] | Freq: Every day | SUBCUTANEOUS | Status: DC
  Administered 2014-12-05: 20 [IU] via SUBCUTANEOUS
  Filled 2014-12-05 (×2): qty 0.2

## 2014-12-05 MED ORDER — INSULIN ASPART 100 UNIT/ML ~~LOC~~ SOLN
0.0000 [IU] | Freq: Three times a day (TID) | SUBCUTANEOUS | Status: DC
  Administered 2014-12-05: 8 [IU] via SUBCUTANEOUS
  Administered 2014-12-05 – 2014-12-06 (×2): 3 [IU] via SUBCUTANEOUS
  Administered 2014-12-06: 5 [IU] via SUBCUTANEOUS
  Administered 2014-12-06: 2 [IU] via SUBCUTANEOUS

## 2014-12-05 MED ORDER — NYSTATIN 100000 UNIT/GM EX POWD
Freq: Two times a day (BID) | CUTANEOUS | Status: DC
  Administered 2014-12-05 – 2014-12-06 (×3): via TOPICAL
  Filled 2014-12-05: qty 15

## 2014-12-05 MED ORDER — SODIUM POLYSTYRENE SULFONATE 15 GM/60ML PO SUSP
15.0000 g | Freq: Once | ORAL | Status: AC
  Administered 2014-12-05: 15 g via ORAL
  Filled 2014-12-05: qty 60

## 2014-12-05 MED ORDER — BISACODYL 10 MG RE SUPP
10.0000 mg | Freq: Every day | RECTAL | Status: DC | PRN
  Administered 2014-12-05: 10 mg via RECTAL
  Filled 2014-12-05: qty 1

## 2014-12-05 NOTE — Progress Notes (Signed)
Patient transferred to 300 without difficulty. In no obvious or stated distress. No further

## 2014-12-05 NOTE — Progress Notes (Signed)
Sitting on bed side commode patients heart rate increased to 150 beats per minute. Became very sleepy and tired. Helped to bed and sat on edge. Heart rate decreased to 85 and she stated she was cold and very tired and fell asleep. Still has not had a bowel movement.

## 2014-12-05 NOTE — Progress Notes (Addendum)
[facility]                                                                              Patient Demographics  Stephanie Combs, is a 64 y.o. female, DOB - 1951/04/04, GUR:[id number]  Admit date - 12/04/2014   Admitting Physician Samuella Cota, MD  Outpatient Primary MD for the patient is Neale Burly, MD  LOS -    Chief Complaint  Patient presents with  . Chest Pain  . Altered Mental Status      Summary 64 year old with history of diastolic heart failure, CAD, stage III, presented to the emergency department with complaints of chest pain, hypoglycemia, AKI, acute encephalopathy.  Currently improving.  Will restart patient on insulin sliding scale with Levemir and monitor carefully.  Assessment & Plan   Acute on chronic kidney disease, stage III -Possibly related to diuretics versus NSAID use versus urinary retention -Improving with IV fluids -Upon admission, creatinine was 3, currently 1.8 -Renal ultrasound unremarkable  Hypotension -Resolved, lactic acid was normal -Patient was given IV fluids  Acute encephalopathy -Resolved, likely multifactorial including hypoglycemia versus hypoxia  Urinary retention -Renal ultrasound unremarkable -Continue to monitor output  Chest pain -Currently chest pain-free, troponins cycled and found to be negative  Anemia of chronic disease -Baseline hemoglobin approximately 11, dropped likely dilutional due to IV fluids -Continue to monitor CBC  Diabetes mellitus type 2, with episodes of hypoglycemia upon admission -Blood sugars have improved, will restart insulin sliding scale with Levemir and monitor carefully -Hemoglobin A1c was 8.5 and 6 2016 -Metformin stopped -empagliflozin held  Chronic diastolic heart failure -Patient has bilateral lower extremity edema however chest is clear -Currently compensated -Continue to monitor closely, daily weights, intake and output  COPD/chronic hypoxic respiratory failure -Uses 2  L nasal cannula at home mainly at night -Will continue to monitor, patient may need to use more frequently during the day -Will monitor O2 sats with and without [place], at rest and ambulation   Morbid obesity -BMI 51.3 -Upon discharge, patient should speak with her primary care physician regarding lifestyle modifications including diet and exercise  Recent left lower extremity cellulitis -Continue outpatient dialysis for right diabetic toe ulcer -Currently no evidence of sepsis  Hyperkalemia -Will give dose of Kayexalate and monitor BMP  Code Status: Full  Family Communication: None at bedside  Disposition Plan: Admitted.  Continue to monitor.  Will restart insulin today.   Time Spent in minutes   30 minutes  Procedures  Renal US  Consults   None  DVT Prophylaxis  Heparin  Lab Results  Component Value Date   PLT 215 12/05/2014    Medications  Scheduled Meds: . amoxicillin-clavulanate  1 tablet Oral Q12H  . antiseptic oral rinse  7 mL Mouth Rinse BID  . aspirin EC  81 mg Oral Daily  . gabapentin  1,200 mg Oral BID  . heparin  5,000 Units Subcutaneous 3 times per day  . insulin aspart  0-15 Units Subcutaneous TID WC  . insulin aspart  0-5 Units Subcutaneous QHS  . insulin detemir  20 Units Subcutaneous QHS  . ipratropium-albuterol  3 mL Nebulization Q6H  . levofloxacin  500 mg Oral  Q48H  . levothyroxine  250 mcg Oral QAC breakfast  . nicotine  21 mg Transdermal Daily  . nystatin   Topical BID  . pantoprazole  40 mg Oral BID  . sodium chloride  3 mL Intravenous Q12H  . sodium chloride  3 mL Intravenous Q12H   Continuous Infusions:  PRN Meds:.sodium chloride, acetaminophen **OR** acetaminophen, alprazolam, diphenhydrAMINE, meclizine, ondansetron, sodium chloride, tiZANidine  Antibiotics    Anti-infectives    Start     Dose/Rate Route Frequency Ordered Stop   12/04/14 1100  amoxicillin-clavulanate (AUGMENTIN) 875-125 MG per tablet 1 tablet     1 tablet Oral  Every 12 hours 12/04/14 0939     12/04/14 1000  levofloxacin (LEVAQUIN) tablet 500 mg     500 mg Oral Every 48 hours 12/04/14 9833          Subjective:   Stephanie Combs seen and examined today.  Upon questioning this morning, patient was lethargic however was able to answer questions when aroused. Patient currently seems to be at her baseline. Denies any chest pain, shortness of breath, abdominal pain, dizziness or headache.   Objective:   Filed Vitals:   12/05/14 0800 12/05/14 0810 12/05/14 0900 12/05/14 1141  BP: 117/53  121/56   Pulse: 70  82   Temp:  98.3 F (36.8 C)  98.5 F (36.9 C)  TempSrc:  Oral  Oral  Resp: 21  18   Height:      Weight:      SpO2: 96%  96%     Wt Readings from Last 3 Encounters:  12/05/14 126.6 kg (279 lb 1.6 oz)  11/28/14 125.9 kg (277 lb 9 oz)  11/10/14 125.329 kg (276 lb 4.8 oz)     Intake/Output Summary (Last 24 hours) at 12/05/14 1223 Last data filed at 12/05/14 1000  Gross per 24 hour  Intake   1400 ml  Output   3850 ml  Net  -2450 ml    Exam  General: Well developed, well nourished, NAD, appears stated age  HEENT: NCAT, mucous membranes moist.   Cardiovascular: S1 S2 auscultated, no rubs, murmurs or gallops. Regular rate and rhythm.  Respiratory: Clear breath sounds, however diminished.   Abdomen: Soft, nontender, nondistended, + bowel sounds  Extremities: warm dry without cyanosis clubbing. +2 LE edema B/L. Right great toe with ulcer.   Neuro: AAOx3, nonfocal  Skin: Without rashes exudates or nodules  Psych: Normal affect and demeanor with intact judgement and insight  Data Review   Micro Results Recent Results (from the past 240 hour(s))  MRSA PCR Screening     Status: None   Collection Time: 12/04/14 10:47 AM  Result Value Ref Range Status   MRSA by PCR NEGATIVE NEGATIVE Final    Comment:        The GeneXpert MRSA Assay (FDA approved for NASAL specimens only), is one component of a comprehensive MRSA  colonization surveillance program. It is not intended to diagnose MRSA infection nor to guide or monitor treatment for MRSA infections.     Radiology Reports US Renal  12/04/2014   CLINICAL DATA:  Acute kidney injury.  EXAM: RENAL / URINARY TRACT ULTRASOUND COMPLETE  COMPARISON:  None.  FINDINGS: Right Kidney:  Length: 11.2 cm. Echogenicity within normal limits. No mass or hydronephrosis visualized.  Left Kidney:  Length: 13.7 cm. Echogenicity within normal limits. No mass or hydronephrosis visualized.  Bladder:  Decompressed secondary to Foley catheter.  IMPRESSION: No definite renal abnormality seen.  Electronically Signed   By: Marijo Conception, M.D.   On: 12/04/2014 11:11   Mr Foot Right Wo Contrast  11/09/2014   CLINICAL DATA:  Wound on the right great toe for several weeks. Diabetic patient. Initial encounter.  EXAM: MRI OF THE RIGHT FOREFOOT WITHOUT CONTRAST  TECHNIQUE: Multiplanar, multisequence MR imaging was performed. No intravenous contrast was administered.  COMPARISON:  Plain films of the right great toe 11/07/2014.  FINDINGS: There is intense subcutaneous edema over the dorsum of the foot. The appears to be a soft tissue wound along the medial aspect of the distal right great toe. No focal fluid collection is identified. Mildly increased T2 signal is seen in the distal phalanx of the great toe. Bone marrow signal is otherwise unremarkable. All visualized tendons appear intact. Intrinsic musculature of the foot is atrophied. No mass identified.  IMPRESSION: Wound along the medial aspect of the right great toe. No abscess is identified. Mild marrow edema in the distal phalanx of the great toe is worrisome for osteomyelitis but could reflect reactive change.  Intense subcutaneous edema over the dorsum of the foot.  Atrophy of the intrinsic musculature of the foot.   Electronically Signed   By: Inge Rise M.D.   On: 11/09/2014 10:55   US Venous Img Lower Unilateral Right  11/28/2014    CLINICAL DATA:  Right lower extremity swelling.  EXAM: Right LOWER EXTREMITY VENOUS DOPPLER ULTRASOUND  TECHNIQUE: Gray-scale sonography with graded compression, as well as color Doppler and duplex ultrasound were performed to evaluate the lower extremity deep venous systems from the level of the common femoral vein and including the common femoral, femoral, profunda femoral, popliteal and calf veins including the posterior tibial, peroneal and gastrocnemius veins when visible. The superficial great saphenous vein was also interrogated. Spectral Doppler was utilized to evaluate flow at rest and with distal augmentation maneuvers in the common femoral, femoral and popliteal veins.  COMPARISON:  None.  FINDINGS: Contralateral Common Femoral Vein: Respiratory phasicity is normal and symmetric with the symptomatic side. No evidence of thrombus. Normal compressibility.  Common Femoral Vein: No evidence of thrombus. Normal compressibility, respiratory phasicity and response to augmentation.  Saphenofemoral Junction: No evidence of thrombus. Normal compressibility and flow on color Doppler imaging.  Profunda Femoral Vein: No evidence of thrombus. Normal compressibility and flow on color Doppler imaging.  Femoral Vein: No evidence of thrombus. Normal compressibility, respiratory phasicity and response to augmentation.  Popliteal Vein: No evidence of thrombus. Normal compressibility, respiratory phasicity and response to augmentation.  Calf Veins: No evidence of thrombus. Normal compressibility and flow on color Doppler imaging.  Superficial Great Saphenous Vein: No evidence of thrombus. Normal compressibility and flow on color Doppler imaging.  Venous Reflux:  None.  Other Findings:  None.  IMPRESSION: No evidence of deep venous thrombosis seen in right lower extremity.   Electronically Signed   By: Marijo Conception, M.D.   On: 11/28/2014 14:44   US Arterial Seg Multiple  11/09/2014   CLINICAL DATA:  Right great toe  diabetic ulcer for 5 weeks. Peripheral vascular disease, leg pain concern for claudication.  EXAM: NONINVASIVE PHYSIOLOGIC VASCULAR STUDY OF BILATERAL LOWER EXTREMITIES  TECHNIQUE: Evaluation of both lower extremities was performed at rest, including calculation of ankle-brachial indices, multiple segmental pressure evaluation, segmental Doppler and segmental pulse volume recording.  COMPARISON:  None.  FINDINGS: Limited exam. Toe pressures were not obtainable. Patient refused pressure measurements in the thigh and popliteal regions.  Right ABI:  1.32  Left ABI:  1.21  Right Lower Extremity: Right lower thigh vessels are noncompressible compatible with peripheral atherosclerosis. Right posterior tibial tracing is triphasic. Right dorsalis pedis tracing is minimally dampened and biphasic. No significant waveform or pulse volume recording abnormality. Toe pressure is not obtained. Right ABI 1.32, likely falsely elevated related to peripheral calcific atherosclerosis.  Left Lower Extremity: Triphasic left posterior tibial and dorsalis pedis tracings. No significant pressure gradient. Symmetric pulse from recordings.  IMPRESSION: ABIs greater than 1 bilaterally, suspect falsely elevated related to peripheral atherosclerosis. No significant posterior tibial or dorsalis pedis waveform abnormality at either ankle. Symmetric pulse volume recordings.   Electronically Signed   By: Jerilynn Mages.  Shick M.D.   On: 11/09/2014 15:18   Dg Chest Port 1 View  12/04/2014   CLINICAL DATA:  Altered mental status. Intermittent midsternal chest pain starting today. Bilateral leg swelling and weakness.  EXAM: PORTABLE CHEST - 1 VIEW  COMPARISON:  Most recent chest radiograph 11/24/2014  FINDINGS: Lung volumes remain low. The patient is rotated to the left. Cardiomediastinal contours are unchanged with mild cardiomegaly. No evidence of pulmonary edema, confluent airspace disease, pleural effusion or pneumothorax. Minimal left basilar atelectasis.   IMPRESSION: Hypoventilatory chest with stable mild cardiomegaly.   Electronically Signed   By: Jeb Levering M.D.   On: 12/04/2014 06:04   Dg Chest Portable 1 View  11/24/2014   CLINICAL DATA:  Shortness of breath.  Fluid retention.  EXAM: PORTABLE CHEST - 1 VIEW  COMPARISON:  One-view chest 11/18/2014.  FINDINGS: The heart is enlarged. The patient is rotated to the right. There is no significant edema or effusion to suggest failure. No focal airspace disease is evident.  IMPRESSION: Stable cardiomegaly without failure.   Electronically Signed   By: San Morelle M.D.   On: 11/24/2014 09:19   Dg Chest Portable 1 View  11/07/2014   CLINICAL DATA:  Dyspnea for 2 weeks.  EXAM: PORTABLE CHEST - 1 VIEW  COMPARISON:  07/19/2014  FINDINGS: A single AP portable view of the chest demonstrates no focal airspace consolidation or alveolar edema. The lungs are grossly clear. There is no large effusion or pneumothorax. Cardiac and mediastinal contours appear unremarkable.  IMPRESSION: No active disease.   Electronically Signed   By: Andreas Newport M.D.   On: 11/07/2014 02:57   Dg Foot 2 Views Right  11/07/2014   CLINICAL DATA:  Diabetic foot ulcer.  Right great toe wound.  EXAM: RIGHT FOOT - 2 VIEW  COMPARISON:  None.  FINDINGS: There is soft tissue ulceration along the dorsal aspect of the great toe with several punctate radiodensities along the margins of the ulcer. There is soft tissue swelling involving the great toe, with diffuse soft tissue swelling about the foot and visualized lower leg as well. No dissecting soft tissue emphysema is identified. No acute fracture, dislocation, or osseous erosion is identified. The bones are diffusely osteopenic. Small plantar calcaneal enthesophyte is noted.  IMPRESSION: 1. Right great toe soft tissue ulceration with several punctate densities along its margin. Correlate for foreign bodies on the skin or in the ulcer. 2. No radiographic evidence of active  osteomyelitis. 3. Diffuse soft tissue swelling.   Electronically Signed   By: Logan Bores   On: 11/07/2014 09:22    CBC  Recent Labs Lab 12/04/14 0456 12/04/14 0509 12/05/14 0501  WBC 10.2  --  6.8  HGB 10.2* 11.2* 10.0*  HCT 32.6* 33.0* 31.9*  PLT 261  --  215  MCV 83.4  --  83.5  MCH 26.1  --  26.2  MCHC 31.3  --  31.3  RDW 17.4*  --  17.3*  LYMPHSABS 3.0  --   --   MONOABS 1.1*  --   --   EOSABS 0.6  --   --   BASOSABS 0.2*  --   --     Chemistries   Recent Labs Lab 12/04/14 0456 12/04/14 0509 12/05/14 0501  NA 135 135 136  K 4.5 4.4 5.2*  CL 96* 95* 102  CO2 30  --  24  GLUCOSE 74 89 205*  BUN 61* 58* 43*  CREATININE 3.00* 2.90* 1.80*  CALCIUM 8.7*  --  8.3*  AST 16  --   --   ALT 15  --   --   ALKPHOS 57  --   --   BILITOT 0.3  --   --    ------------------------------------------------------------------------------------------------------------------ estimated creatinine clearance is 43.7 mL/min (by C-G formula based on Cr of 1.8). ------------------------------------------------------------------------------------------------------------------ No results for input(s): HGBA1C in the last 72 hours. ------------------------------------------------------------------------------------------------------------------ No results for input(s): CHOL, HDL, LDLCALC, TRIG, CHOLHDL, LDLDIRECT in the last 72 hours. ------------------------------------------------------------------------------------------------------------------ No results for input(s): TSH, T4TOTAL, T3FREE, THYROIDAB in the last 72 hours.  Invalid input(s): FREET3 ------------------------------------------------------------------------------------------------------------------ No results for input(s): VITAMINB12, FOLATE, FERRITIN, TIBC, IRON, RETICCTPCT in the last 72 hours.  Coagulation profile No results for input(s): INR, PROTIME in the last 168 hours.  No results for input(s): DDIMER in the  last 72 hours.  Cardiac Enzymes  Recent Labs Lab 12/04/14 1140 12/04/14 1708 12/04/14 2218  TROPONINI <0.03 <0.03 <0.03   ------------------------------------------------------------------------------------------------------------------ Invalid input(s): POCBNP    Malachai Schalk D.O. on 12/05/2014 at 12:23 PM  Between 7am to 7pm - Pager - [phone]  After 7pm go to [email or web address] - password [email or web address]  And look for the night coverage person covering for me after hours  [facility] Office  [phone]

## 2014-12-05 NOTE — Care Management Note (Signed)
Case Management Note  Patient Details  Name: HARSHIKA MAGO MRN: [id number] Date of Birth: March 27, 1951  Expected Discharge Date:  12/06/14               Expected Discharge Plan:  [place]  In-House Referral:  NA  Discharge planning Services  CM Consult  Post Acute Care Choice:  Resumption of Svcs/PTA Provider Choice offered to:  Patient  DME Arranged:    DME Agency:     HH Arranged:  RN [place] Agency:  [place]  Status of Service:  In process, will continue to follow  Medicare Important Message Given:    Date Medicare IM Given:    Medicare IM give by:    Date Additional Medicare IM Given:    Additional Medicare Important Message give by:     If discussed at [place] of Stay Meetings, dates discussed:    Additional Comments: Pt is from home and independent at baseline. Pt lives with boyfriend. Pt is active with [facility] for RN services. [facility] aware of admission and will need order to resume [place] services at DC. Pt has home O2 and neb machine. Pt observation, notification signed by patient, copy given to patient and original placed on patient's shadow chart. Will cont to follow for DC planning.  Sherald Barge, RN 12/05/2014, 2:44 PM

## 2014-12-06 DIAGNOSIS — N179 Acute kidney failure, unspecified: Secondary | ICD-10-CM

## 2014-12-06 LAB — GLUCOSE, CAPILLARY
Glucose-Capillary: 193 mg/dL — ABNORMAL HIGH (ref 65–99)
Glucose-Capillary: 203 mg/dL — ABNORMAL HIGH (ref 65–99)
Glucose-Capillary: 185 mg/dL — ABNORMAL HIGH (ref 65–99)

## 2014-12-06 LAB — BASIC METABOLIC PANEL
Anion gap: 7 (ref 5–15)
BUN: 28 mg/dL — ABNORMAL HIGH (ref 6–20)
CO2: 31 mmol/L (ref 22–32)
Calcium: 8.6 mg/dL — ABNORMAL LOW (ref 8.9–10.3)
Chloride: 102 mmol/L (ref 101–111)
Creatinine, Ser: 1.18 mg/dL — ABNORMAL HIGH (ref 0.44–1.00)
GFR calc Af Amer: 55 mL/min — ABNORMAL LOW (ref >60)
GFR calc non Af Amer: 48 mL/min — ABNORMAL LOW (ref >60)
Glucose, Bld: 218 mg/dL — ABNORMAL HIGH (ref 65–99)
Potassium: 3.6 mmol/L (ref 3.5–5.1)
Sodium: 140 mmol/L (ref 135–145)

## 2014-12-06 LAB — CBC
HCT: 32.2 % — ABNORMAL LOW (ref 36.0–46.0)
Hemoglobin: 10.2 g/dL — ABNORMAL LOW (ref 12.0–15.0)
MCH: 26.4 pg (ref 26.0–34.0)
MCHC: 31.7 g/dL (ref 30.0–36.0)
MCV: 83.4 fL (ref 78.0–100.0)
Platelets: 256 10*3/uL (ref 150–400)
RBC: 3.86 MIL/uL — ABNORMAL LOW (ref 3.87–5.11)
RDW: 17 % — ABNORMAL HIGH (ref 11.5–15.5)
WBC: 6.1 10*3/uL (ref 4.0–10.5)

## 2014-12-06 MED ORDER — FUROSEMIDE 20 MG PO TABS: 40.0000 mg | ORAL_TABLET | Freq: Two times a day (BID) | ORAL | Status: DC

## 2014-12-06 MED ORDER — INSULIN DETEMIR 100 UNIT/ML ~~LOC~~ SOLN: 20.0000 [IU] | Freq: Every day | SUBCUTANEOUS | Status: DC

## 2014-12-06 NOTE — Clinical Social Work Note (Signed)
Pt requesting bills from previous hospital stay. Notified financial counselor who will assist. Pt appreciative and reports no further needs.   Stephanie Combs, [place]

## 2014-12-06 NOTE — Discharge Summary (Signed)
Physician Discharge Summary  Stephanie Combs CHY:[id number] DOB: 14-Mar-1951 DOA: 12/04/2014  PCP: Neale Burly, MD  Admit date: 12/04/2014 Discharge date: 12/06/2014  Time spent: 45 minutes  Recommendations for Outpatient Follow-up:  -Will be discharged home today. -Has follow-up scheduled with cardiology in 2 days, at that time would recommend a basic metabolic profile to follow renal function and make further adjustments to her diuretic regimen.   Discharge Diagnoses:  Principal Problem:   AKI (acute kidney injury) Active Problems:   Hypotension   COPD (chronic obstructive pulmonary disease)   Morbid obesity   DM type 2 (diabetes mellitus, type 2)   CKD (chronic kidney disease), stage III   Chronic diastolic CHF (congestive heart failure)   Anemia in CKD (chronic kidney disease)   Chest pain   Foot ulcer due to secondary DM   Acute encephalopathy   Discharge Condition: Stable and improved  Filed Weights   12/04/14 0900 12/05/14 0500 12/05/14 2008  Weight: 128 kg (282 lb 3 oz) 126.6 kg (279 lb 1.6 oz) 127.688 kg (281 lb 8 oz)    History of present illness:  63yow presented to ED with chest pain, low blood sugar. Initial evaluation with unremarkable CXR, nonacute EKG. AKI, hypotension that responded to IVF bolus. Referred for admission for AKI, hypotension, acute encephalopathy, chest pain.  History obtained from patient. Tangential, requires frequent redirection. Reports multiple complaints. She was compliant with her discharge medications including Lasix but reports low urine output. Chest pain pressure x 1 month, worse in the last 24 hours a pressure-like sensation "hurts a lot" no specific aggravating or alleviating factors, currently better. Nausea without vomiting, no abdominal pain. Slight SOB Low CBG she checked her blood sugars this morning and they were in the 30s. She ate a Snickers but it failed to improve so she called 911. She has been drinking a lot of  water.  Discharge 7/19, treated for acute on chronic diastolic heart failure, acute kidney injury superimposed on chronic kidney disease stage III, left lower extremity cellulitis,  In the emergency department intermittent hypotension with most recent blood pressure 119/79. Normal heart rate. No tachypnea. Afebrile. Stable oxygenation on 2 L. Treated with 1.5 L bolus.  Hospital Course:   Acute on chronic kidney disease stage III -Creatinine is back to baseline of 1.1-1.3. -Creatinine was 3 on admission. -Renal ultrasound was unremarkable for obstruction or hydronephrosis. -Resolved with IV fluids. -Possibly related to overdiuresis versus NSAID use.  Chronic diastolic CHF -Has bilateral lower extremity edema which is baseline for her, chest x-ray without signs of volume overload. -Please note that on discharge, her diuretic dose has been reduced to half her home dose given acute renal failure. -Given her recent hospitalization for acute decompensation of CHF, have recommended close follow-up with cardiology (has an appointment in 2 days) at which time renal function and diuretic dose should be re- evaluated.  Acute encephalopathy -Resolved, likely multifactorial including hypoglycemia.  Chest pain -Currently chest pain-free. -Ruled out for acute coronary syndrome with negative troponins and EKGs that are nonacute. -I did ask Dr. Harl Bowie to review patient's EKG given what appeared to be ST elevations in her inferior leads, however, he believes these are more repolarization changes.  Type 2 diabetes with hypoglycemic episodes on admission -Hemoglobin A1c was 8.5 in   June 2016. -Metformin has been discontinued, will continue Jardiance upon discharge, long acting insulin dose will be reduced, will need close follow-up with her primary care physician for further diabetic  management.  Hypotension -Patient was hypotensive on admission requiring multiple fluid boluses, this enforces the  belief that she is probably over diuresed as an outpatient. -Cardizem has been continued upon discharge. -At outpatient follow-up, consider use of alternate anti-hypertensive medication ;given her history of diabetes and chronic heart failure would probably benefit from an ACE inhibitor plus minus beta blocker.  Procedures:   None   Consultations:   None  Discharge Instructions  Discharge Instructions    Diet - low sodium heart healthy    Complete by:  As directed      Increase activity slowly    Complete by:  As directed             Medication List    STOP taking these medications        amoxicillin-clavulanate 875-125 MG per tablet  Commonly known as:  AUGMENTIN     levofloxacin 500 MG tablet  Commonly known as:  LEVAQUIN     naproxen sodium 220 MG tablet  Commonly known as:  ANAPROX     OVER THE COUNTER MEDICATION     oxyCODONE-acetaminophen 10-325 MG per tablet  Commonly known as:  PERCOCET      TAKE these medications        albuterol 108 (90 BASE) MCG/ACT inhaler  Commonly known as:  PROAIR HFA  Inhale 1 puff into the lungs every 4 (four) hours as needed. For shortness of breath     alprazolam 2 MG tablet  Commonly known as:  XANAX  Take 1 tablet (2 mg total) by mouth 3 (three) times daily as needed for anxiety.     aspirin EC 81 MG tablet  Take 81 mg by mouth daily.     benzonatate 100 MG capsule  Commonly known as:  TESSALON  Take 1 capsule (100 mg total) by mouth 3 (three) times daily as needed for cough.     diltiazem 240 MG 24 hr capsule  Commonly known as:  CARDIZEM CD  Take 1 capsule (240 mg total) by mouth daily.     diphenhydrAMINE 25 MG tablet  Commonly known as:  BENADRYL  Take 25 mg by mouth every 6 (six) hours as needed for allergies.     FISH OIL PO  Take 1 capsule by mouth 2 (two) times daily.     FLEET LAXATIVE RE  Place 1 application rectally daily as needed (constipation).     furosemide 20 MG tablet  Commonly known as:   LASIX  Take 2 tablets (40 mg total) by mouth 2 (two) times daily.     gabapentin 600 MG tablet  Commonly known as:  NEURONTIN  Take 1 tablet (600 mg total) by mouth 4 (four) times daily.     insulin aspart 100 UNIT/ML injection  Commonly known as:  novoLOG  Inject 0-20 Units into the skin 3 (three) times daily with meals. Takes 20 Units in am and 20 Units at night.  Per sliding scale     insulin detemir 100 UNIT/ML injection  Commonly known as:  LEVEMIR  Inject 0.2 mLs (20 Units total) into the skin at bedtime.     ipratropium-albuterol 0.5-2.5 (3) MG/3ML Soln  Commonly known as:  DUONEB  Take 3 mLs by nebulization every 6 (six) hours.     JARDIANCE 25 MG Tabs tablet  Generic drug:  empagliflozin  Take 25 mg by mouth daily.     levothyroxine 125 MCG tablet  Commonly known as:  SYNTHROID, LEVOTHROID  Take  2 tablets (250 mcg total) by mouth daily before breakfast.     meclizine 25 MG tablet  Commonly known as:  ANTIVERT  Take 25 mg by mouth 3 (three) times daily as needed for dizziness.     metFORMIN 500 MG tablet  Commonly known as:  GLUCOPHAGE  Take 500 mg by mouth 2 (two) times daily with a meal.     nicotine 21 mg/24hr patch  Commonly known as:  NICODERM CQ - dosed in mg/24 hours  Place 1 patch (21 mg total) onto the skin daily.     nystatin 100000 UNIT/GM Powd  Apply 1 g topically 2 (two) times daily.     ondansetron 8 MG disintegrating tablet  Commonly known as:  ZOFRAN ODT  Take 1 tablet (8 mg total) by mouth every 8 (eight) hours as needed for nausea or vomiting.     pantoprazole 40 MG tablet  Commonly known as:  PROTONIX  Take 1 tablet (40 mg total) by mouth 2 (two) times daily.     potassium chloride 10 MEQ tablet  Commonly known as:  K-DUR  Take 20 mEq by mouth 2 (two) times daily.     tiZANidine 4 MG tablet  Commonly known as:  ZANAFLEX  Take 4 mg by mouth 4 (four) times daily.       Allergies  Allergen Reactions  . Dilaudid [Hydromorphone Hcl]  Itching  . Actifed Cold-Allergy [Chlorpheniramine-Phenylephrine]     "I was sick and red and it didn't agree with me at all"  . Other Cough    Pt states she is allergic to ragweed and that she starts coughing and sneezing like crazy  . Penicillins Hives and Itching  . Reglan [Metoclopramide] Itching  . Valium [Diazepam] Itching  . Vistaril [Hydroxyzine Hcl] Itching      The results of significant diagnostics from this hospitalization (including imaging, microbiology, ancillary and laboratory) are listed below for reference.    Significant Diagnostic Studies: US Renal  12/04/2014   CLINICAL DATA:  Acute kidney injury.  EXAM: RENAL / URINARY TRACT ULTRASOUND COMPLETE  COMPARISON:  None.  FINDINGS: Right Kidney:  Length: 11.2 cm. Echogenicity within normal limits. No mass or hydronephrosis visualized.  Left Kidney:  Length: 13.7 cm. Echogenicity within normal limits. No mass or hydronephrosis visualized.  Bladder:  Decompressed secondary to Foley catheter.  IMPRESSION: No definite renal abnormality seen.   Electronically Signed   By: Marijo Conception, M.D.   On: 12/04/2014 11:11   Mr Foot Right Wo Contrast  11/09/2014   CLINICAL DATA:  Wound on the right great toe for several weeks. Diabetic patient. Initial encounter.  EXAM: MRI OF THE RIGHT FOREFOOT WITHOUT CONTRAST  TECHNIQUE: Multiplanar, multisequence MR imaging was performed. No intravenous contrast was administered.  COMPARISON:  Plain films of the right great toe 11/07/2014.  FINDINGS: There is intense subcutaneous edema over the dorsum of the foot. The appears to be a soft tissue wound along the medial aspect of the distal right great toe. No focal fluid collection is identified. Mildly increased T2 signal is seen in the distal phalanx of the great toe. Bone marrow signal is otherwise unremarkable. All visualized tendons appear intact. Intrinsic musculature of the foot is atrophied. No mass identified.  IMPRESSION: Wound along the medial  aspect of the right great toe. No abscess is identified. Mild marrow edema in the distal phalanx of the great toe is worrisome for osteomyelitis but could reflect reactive change.  Intense subcutaneous edema  over the dorsum of the foot.  Atrophy of the intrinsic musculature of the foot.   Electronically Signed   By: Inge Rise M.D.   On: 11/09/2014 10:55   US Venous Img Lower Unilateral Right  11/28/2014   CLINICAL DATA:  Right lower extremity swelling.  EXAM: Right LOWER EXTREMITY VENOUS DOPPLER ULTRASOUND  TECHNIQUE: Gray-scale sonography with graded compression, as well as color Doppler and duplex ultrasound were performed to evaluate the lower extremity deep venous systems from the level of the common femoral vein and including the common femoral, femoral, profunda femoral, popliteal and calf veins including the posterior tibial, peroneal and gastrocnemius veins when visible. The superficial great saphenous vein was also interrogated. Spectral Doppler was utilized to evaluate flow at rest and with distal augmentation maneuvers in the common femoral, femoral and popliteal veins.  COMPARISON:  None.  FINDINGS: Contralateral Common Femoral Vein: Respiratory phasicity is normal and symmetric with the symptomatic side. No evidence of thrombus. Normal compressibility.  Common Femoral Vein: No evidence of thrombus. Normal compressibility, respiratory phasicity and response to augmentation.  Saphenofemoral Junction: No evidence of thrombus. Normal compressibility and flow on color Doppler imaging.  Profunda Femoral Vein: No evidence of thrombus. Normal compressibility and flow on color Doppler imaging.  Femoral Vein: No evidence of thrombus. Normal compressibility, respiratory phasicity and response to augmentation.  Popliteal Vein: No evidence of thrombus. Normal compressibility, respiratory phasicity and response to augmentation.  Calf Veins: No evidence of thrombus. Normal compressibility and flow on color  Doppler imaging.  Superficial Great Saphenous Vein: No evidence of thrombus. Normal compressibility and flow on color Doppler imaging.  Venous Reflux:  None.  Other Findings:  None.  IMPRESSION: No evidence of deep venous thrombosis seen in right lower extremity.   Electronically Signed   By: Marijo Conception, M.D.   On: 11/28/2014 14:44   US Arterial Seg Multiple  11/09/2014   CLINICAL DATA:  Right great toe diabetic ulcer for 5 weeks. Peripheral vascular disease, leg pain concern for claudication.  EXAM: NONINVASIVE PHYSIOLOGIC VASCULAR STUDY OF BILATERAL LOWER EXTREMITIES  TECHNIQUE: Evaluation of both lower extremities was performed at rest, including calculation of ankle-brachial indices, multiple segmental pressure evaluation, segmental Doppler and segmental pulse volume recording.  COMPARISON:  None.  FINDINGS: Limited exam. Toe pressures were not obtainable. Patient refused pressure measurements in the thigh and popliteal regions.  Right ABI:  1.32  Left ABI:  1.21  Right Lower Extremity: Right lower thigh vessels are noncompressible compatible with peripheral atherosclerosis. Right posterior tibial tracing is triphasic. Right dorsalis pedis tracing is minimally dampened and biphasic. No significant waveform or pulse volume recording abnormality. Toe pressure is not obtained. Right ABI 1.32, likely falsely elevated related to peripheral calcific atherosclerosis.  Left Lower Extremity: Triphasic left posterior tibial and dorsalis pedis tracings. No significant pressure gradient. Symmetric pulse from recordings.  IMPRESSION: ABIs greater than 1 bilaterally, suspect falsely elevated related to peripheral atherosclerosis. No significant posterior tibial or dorsalis pedis waveform abnormality at either ankle. Symmetric pulse volume recordings.   Electronically Signed   By: Jerilynn Mages.  Shick M.D.   On: 11/09/2014 15:18   Dg Chest Port 1 View  12/04/2014   CLINICAL DATA:  Altered mental status. Intermittent midsternal  chest pain starting today. Bilateral leg swelling and weakness.  EXAM: PORTABLE CHEST - 1 VIEW  COMPARISON:  Most recent chest radiograph 11/24/2014  FINDINGS: Lung volumes remain low. The patient is rotated to the left. Cardiomediastinal contours are unchanged with mild  cardiomegaly. No evidence of pulmonary edema, confluent airspace disease, pleural effusion or pneumothorax. Minimal left basilar atelectasis.  IMPRESSION: Hypoventilatory chest with stable mild cardiomegaly.   Electronically Signed   By: Jeb Levering M.D.   On: 12/04/2014 06:04   Dg Chest Portable 1 View  11/24/2014   CLINICAL DATA:  Shortness of breath.  Fluid retention.  EXAM: PORTABLE CHEST - 1 VIEW  COMPARISON:  One-view chest 11/18/2014.  FINDINGS: The heart is enlarged. The patient is rotated to the right. There is no significant edema or effusion to suggest failure. No focal airspace disease is evident.  IMPRESSION: Stable cardiomegaly without failure.   Electronically Signed   By: San Morelle M.D.   On: 11/24/2014 09:19   Dg Chest Portable 1 View  11/07/2014   CLINICAL DATA:  Dyspnea for 2 weeks.  EXAM: PORTABLE CHEST - 1 VIEW  COMPARISON:  07/19/2014  FINDINGS: A single AP portable view of the chest demonstrates no focal airspace consolidation or alveolar edema. The lungs are grossly clear. There is no large effusion or pneumothorax. Cardiac and mediastinal contours appear unremarkable.  IMPRESSION: No active disease.   Electronically Signed   By: Andreas Newport M.D.   On: 11/07/2014 02:57   Dg Foot 2 Views Right  11/07/2014   CLINICAL DATA:  Diabetic foot ulcer.  Right great toe wound.  EXAM: RIGHT FOOT - 2 VIEW  COMPARISON:  None.  FINDINGS: There is soft tissue ulceration along the dorsal aspect of the great toe with several punctate radiodensities along the margins of the ulcer. There is soft tissue swelling involving the great toe, with diffuse soft tissue swelling about the foot and visualized lower leg as  well. No dissecting soft tissue emphysema is identified. No acute fracture, dislocation, or osseous erosion is identified. The bones are diffusely osteopenic. Small plantar calcaneal enthesophyte is noted.  IMPRESSION: 1. Right great toe soft tissue ulceration with several punctate densities along its margin. Correlate for foreign bodies on the skin or in the ulcer. 2. No radiographic evidence of active osteomyelitis. 3. Diffuse soft tissue swelling.   Electronically Signed   By: Logan Bores   On: 11/07/2014 09:22    Microbiology: Recent Results (from the past 240 hour(s))  MRSA PCR Screening     Status: None   Collection Time: 12/04/14 10:47 AM  Result Value Ref Range Status   MRSA by PCR NEGATIVE NEGATIVE Final    Comment:        The GeneXpert MRSA Assay (FDA approved for NASAL specimens only), is one component of a comprehensive MRSA colonization surveillance program. It is not intended to diagnose MRSA infection nor to guide or monitor treatment for MRSA infections.      Labs: Basic Metabolic Panel:  Recent Labs Lab 12/04/14 0456 12/04/14 0509 12/05/14 0501 12/06/14 0623  NA 135 135 136 140  K 4.5 4.4 5.2* 3.6  CL 96* 95* 102 102  CO2 30  --  24 31  GLUCOSE 74 89 205* 218*  BUN 61* 58* 43* 28*  CREATININE 3.00* 2.90* 1.80* 1.18*  CALCIUM 8.7*  --  8.3* 8.6*   Liver Function Tests:  Recent Labs Lab 12/04/14 0456  AST 16  ALT 15  ALKPHOS 57  BILITOT 0.3  PROT 6.1*  ALBUMIN 3.2*   No results for input(s): LIPASE, AMYLASE in the last 168 hours. No results for input(s): AMMONIA in the last 168 hours. CBC:  Recent Labs Lab 12/04/14 0456 12/04/14 0509 12/05/14 0501  12/06/14 0623  WBC 10.2  --  6.8 6.1  NEUTROABS 5.4  --   --   --   HGB 10.2* 11.2* 10.0* 10.2*  HCT 32.6* 33.0* 31.9* 32.2*  MCV 83.4  --  83.5 83.4  PLT 261  --  215 256   Cardiac Enzymes:  Recent Labs Lab 12/04/14 0456 12/04/14 1140 12/04/14 1708 12/04/14 2218  TROPONINI <0.03  <0.03 <0.03 <0.03   BNP: BNP (last 3 results)  Recent Labs  11/07/14 0235 11/24/14 0933 12/04/14 0456  BNP 102.0* 50.0 50.0    ProBNP (last 3 results) No results for input(s): PROBNP in the last 8760 hours.  CBG:  Recent Labs Lab 12/05/14 1134 12/05/14 1626 12/05/14 2059 12/05/14 2102 12/06/14 0712  GLUCAP 253* 196* 406* 369* 193*       Signed:  Lelon Frohlich  [facility] Pager: [phone] 12/06/2014, 10:22 AM

## 2014-12-06 NOTE — Care Management Note (Signed)
Case Management Note  Patient Details  Name: Stephanie Combs MRN: [id number] Date of Birth: 09-Apr-1951  Subjective/Objective:                    Action/Plan:   Expected Discharge Date:  12/06/14               Expected Discharge Plan:  [place]  In-House Referral:  NA  Discharge planning Services  CM Consult  Post Acute Care Choice:  Resumption of Svcs/PTA Provider Choice offered to:  Patient  DME Arranged:    DME Agency:     HH Arranged:  RN [place] Agency:  [facility]  Status of Service:  Completed, signed off  Medicare Important Message Given:    Date Medicare IM Given:    Medicare IM give by:    Date Additional Medicare IM Given:    Additional Medicare Important Message give by:     If discussed at [place] of Stay Meetings, dates discussed:    Additional Comments: Pt discharged home today with resumption of [facility] Rn (per pts choice). Emma with [facility] is aware of discharge and will collect pts information from the chart. [place] services to resume within 48 hours of discharge. Pt and pts nurse aware of discharge arrangements. Christinia Gully [place], RN 12/06/2014, 10:35 AM

## 2014-12-06 NOTE — Progress Notes (Signed)
Patient with orders to be discharge home. Discharge instructions given, patient verbalized understanding. Prescriptions given. Patient stable. Patient left in private vehicle with family.  

## 2014-12-07 ENCOUNTER — Ambulatory Visit (INDEPENDENT_AMBULATORY_CARE_PROVIDER_SITE_OTHER): Payer: Medicare Other | Admitting: Adult Health

## 2014-12-07 ENCOUNTER — Encounter: Payer: Self-pay | Admitting: Adult Health

## 2014-12-07 VITALS — BP 110/60 | HR 87 | Ht 67.0 in | Wt 275.2 lb

## 2014-12-07 DIAGNOSIS — I10 Essential (primary) hypertension: Secondary | ICD-10-CM | POA: Diagnosis not present

## 2014-12-07 DIAGNOSIS — E09628 Drug or chemical induced diabetes mellitus with other skin complications: Secondary | ICD-10-CM | POA: Diagnosis not present

## 2014-12-07 DIAGNOSIS — I5032 Chronic diastolic (congestive) heart failure: Secondary | ICD-10-CM

## 2014-12-07 DIAGNOSIS — F418 Other specified anxiety disorders: Secondary | ICD-10-CM

## 2014-12-07 NOTE — Patient Instructions (Addendum)
Your physician wants you to follow-up in: 6 months with Jory Sims NP You will receive a reminder letter in the mail two months in advance. If you don't receive a letter, please call our office to schedule the follow-up appointment.   Your physician recommends that you continue on your current medications as directed. Please refer to the Current Medication list given to you today.  You have been referred to Dr Dorris Fetch, for diabetic care    Thank you for choosing [facility] !

## 2014-12-07 NOTE — Progress Notes (Deleted)
Name: Stephanie Combs    DOB: 07/23/1950  Age: 64 y.o.  MR#: [id number]       PCP:  Neale Burly, MD      Insurance: Payor: [employer] MEDICARE / Plan: [facility] MEDICARE / Product Type: *No Product type* /   CC:    Chief Complaint  Patient presents with  . Congestive Heart Failure    Diastolic  . COPD  . Hypertension    VS Filed Vitals:   12/07/14 1413  BP: 110/60  Pulse: 87  Height: '5\' 7"'$  (1.702 m)  Weight: 275 lb 3.2 oz (124.83 kg)  SpO2: 94%    Weights Current Weight  12/07/14 275 lb 3.2 oz (124.83 kg)  12/05/14 281 lb 8 oz (127.688 kg)  11/28/14 277 lb 9 oz (125.9 kg)    Blood Pressure  BP Readings from Last 3 Encounters:  12/07/14 110/60  12/06/14 122/49  11/28/14 108/60     Admit date:  (Not on file) Last encounter with RMR:  Visit date not found   Allergy Dilaudid; Actifed cold-allergy; Other; Penicillins; Reglan; Valium; and Vistaril  Current Outpatient Prescriptions  Medication Sig Dispense Refill  . albuterol (PROAIR HFA) 108 (90 BASE) MCG/ACT inhaler Inhale 1 puff into the lungs every 4 (four) hours as needed. For shortness of breath (Patient taking differently: Inhale 2 puffs into the lungs every 4 (four) hours as needed. For shortness of breath) 1 Inhaler 1  . alprazolam (XANAX) 2 MG tablet Take 1 tablet (2 mg total) by mouth 3 (three) times daily as needed for anxiety.    Marland Kitchen aspirin EC 81 MG tablet Take 81 mg by mouth daily.    . benzonatate (TESSALON) 100 MG capsule Take 1 capsule (100 mg total) by mouth 3 (three) times daily as needed for cough. (Patient taking differently: Take 200 mg by mouth 2 (two) times daily as needed for cough. ) 21 capsule 0  . Bisacodyl (FLEET LAXATIVE RE) Place 1 application rectally daily as needed (constipation).    Marland Kitchen diltiazem (CARDIZEM CD) 240 MG 24 hr capsule Take 1 capsule (240 mg total) by mouth daily. 30 capsule 2  . diphenhydrAMINE (BENADRYL) 25 MG tablet Take 25 mg by mouth every 6 (six) hours as needed for  allergies.    Marland Kitchen empagliflozin (JARDIANCE) 25 MG TABS tablet Take 25 mg by mouth daily.    . furosemide (LASIX) 20 MG tablet Take 2 tablets (40 mg total) by mouth 2 (two) times daily. 180 tablet 1  . gabapentin (NEURONTIN) 600 MG tablet Take 1 tablet (600 mg total) by mouth 4 (four) times daily. (Patient taking differently: Take 1,200 mg by mouth 2 (two) times daily. ) 60 tablet 2  . insulin aspart (NOVOLOG) 100 UNIT/ML injection Inject 0-20 Units into the skin 3 (three) times daily with meals. Takes 20 Units in am and 20 Units at night.  Per sliding scale    . insulin detemir (LEVEMIR) 100 UNIT/ML injection Inject 0.2 mLs (20 Units total) into the skin at bedtime. 10 mL 11  . ipratropium-albuterol (DUONEB) 0.5-2.5 (3) MG/3ML SOLN Take 3 mLs by nebulization every 6 (six) hours. 360 mL 2  . levothyroxine (SYNTHROID, LEVOTHROID) 125 MCG tablet Take 2 tablets (250 mcg total) by mouth daily before breakfast. 30 tablet 2  . meclizine (ANTIVERT) 25 MG tablet Take 25 mg by mouth 3 (three) times daily as needed for dizziness.     . metFORMIN (GLUCOPHAGE) 500 MG tablet Take 500 mg by mouth  2 (two) times daily with a meal.    . nicotine (NICODERM CQ - DOSED IN MG/24 HOURS) 21 mg/24hr patch Place 1 patch (21 mg total) onto the skin daily. 28 patch 0  . nystatin (MYCOSTATIN/NYSTOP) 100000 UNIT/GM POWD Apply 1 g topically 2 (two) times daily.    . Omega-3 Fatty Acids (FISH OIL PO) Take 1 capsule by mouth 2 (two) times daily.     . ondansetron (ZOFRAN ODT) 8 MG disintegrating tablet Take 1 tablet (8 mg total) by mouth every 8 (eight) hours as needed for nausea or vomiting. 10 tablet 0  . pantoprazole (PROTONIX) 40 MG tablet Take 1 tablet (40 mg total) by mouth 2 (two) times daily. 60 tablet 0  . potassium chloride (K-DUR) 10 MEQ tablet Take 20 mEq by mouth 2 (two) times daily.     Marland Kitchen tiZANidine (ZANAFLEX) 4 MG tablet Take 4 mg by mouth 4 (four) times daily.     No current facility-administered medications for this  visit.    Discontinued Meds:   There are no discontinued medications.  Patient Active Problem List   Diagnosis Date Noted  . Peripheral edema 12/04/2014  . AKI (acute kidney injury) 12/04/2014  . Acute encephalopathy 12/04/2014  . Acute renal failure superimposed on stage 3 chronic kidney disease 11/24/2014  . Cellulitis 11/24/2014  . Acute on chronic diastolic heart failure 45/62/5638  . Bilateral edema of lower extremity   . Diabetic ulcer of right great toe   . Foot ulcer due to secondary DM 11/07/2014  . Diabetic ulcer of right foot 11/07/2014  . Boil 01/10/2014  . Edema of lower extremity 05/27/2013  . Hyperkalemia 05/27/2013  . Headache 05/26/2013  . Acute on chronic diastolic congestive heart failure 04/15/2013  . Acute-on-chronic respiratory failure 04/15/2013  . CKD (chronic kidney disease), stage III 04/14/2013  . Acute on chronic diastolic HF (heart failure) 04/14/2013  . Hyponatremia 04/14/2013  . Leukocytosis 04/14/2013  . Anemia in CKD (chronic kidney disease) 04/14/2013  . Chest pain 04/14/2013  . Dyspnea 04/14/2013  . Chronic diastolic CHF (congestive heart failure)   . Hypothyroidism   . UTI (lower urinary tract infection) 08/07/2012  . Hypoglycemia 08/07/2012  . Hypotension 08/07/2012  . COPD (chronic obstructive pulmonary disease) 08/07/2012  . Morbid obesity 08/07/2012  . DM type 2 (diabetes mellitus, type 2) 08/07/2012  . HTN (hypertension), benign 08/07/2012  . Hypothyroidism (acquired) 08/07/2012    LABS    Component Value Date/Time   NA 140 12/06/2014 0623   NA 136 12/05/2014 0501   NA 135 12/04/2014 0509   K 3.6 12/06/2014 0623   K 5.2* 12/05/2014 0501   K 4.4 12/04/2014 0509   CL 102 12/06/2014 0623   CL 102 12/05/2014 0501   CL 95* 12/04/2014 0509   CO2 31 12/06/2014 0623   CO2 24 12/05/2014 0501   CO2 30 12/04/2014 0456   GLUCOSE 218* 12/06/2014 0623   GLUCOSE 205* 12/05/2014 0501   GLUCOSE 89 12/04/2014 0509   BUN 28*  12/06/2014 0623   BUN 43* 12/05/2014 0501   BUN 58* 12/04/2014 0509   CREATININE 1.18* 12/06/2014 0623   CREATININE 1.80* 12/05/2014 0501   CREATININE 2.90* 12/04/2014 0509   CALCIUM 8.6* 12/06/2014 0623   CALCIUM 8.3* 12/05/2014 0501   CALCIUM 8.7* 12/04/2014 0456   GFRNONAA 48* 12/06/2014 0623   GFRNONAA 29* 12/05/2014 0501   GFRNONAA 15* 12/04/2014 0456   GFRAA 55* 12/06/2014 0623   GFRAA 33* 12/05/2014 0501  GFRAA 18* 12/04/2014 0456   CMP     Component Value Date/Time   NA 140 12/06/2014 0623   K 3.6 12/06/2014 0623   CL 102 12/06/2014 0623   CO2 31 12/06/2014 0623   GLUCOSE 218* 12/06/2014 0623   BUN 28* 12/06/2014 0623   CREATININE 1.18* 12/06/2014 0623   CALCIUM 8.6* 12/06/2014 0623   PROT 6.1* 12/04/2014 0456   ALBUMIN 3.2* 12/04/2014 0456   AST 16 12/04/2014 0456   ALT 15 12/04/2014 0456   ALKPHOS 57 12/04/2014 0456   BILITOT 0.3 12/04/2014 0456   GFRNONAA 48* 12/06/2014 0623   GFRAA 55* 12/06/2014 0623       Component Value Date/Time   WBC 6.1 12/06/2014 0623   WBC 6.8 12/05/2014 0501   WBC 10.2 12/04/2014 0456   HGB 10.2* 12/06/2014 0623   HGB 10.0* 12/05/2014 0501   HGB 11.2* 12/04/2014 0509   HCT 32.2* 12/06/2014 0623   HCT 31.9* 12/05/2014 0501   HCT 33.0* 12/04/2014 0509   MCV 83.4 12/06/2014 0623   MCV 83.5 12/05/2014 0501   MCV 83.4 12/04/2014 0456    Lipid Panel  No results found for: CHOL, TRIG, HDL, CHOLHDL, VLDL, LDLCALC, LDLDIRECT  ABG    Component Value Date/Time   TCO2 29 12/04/2014 0509     Lab Results  Component Value Date   TSH 1.168 11/07/2014   BNP (last 3 results)  Recent Labs  11/07/14 0235 11/24/14 0933 12/04/14 0456  BNP 102.0* 50.0 50.0    ProBNP (last 3 results) No results for input(s): PROBNP in the last 8760 hours.  Cardiac Panel (last 3 results)  Recent Labs  12/04/14 1708 12/04/14 2218  TROPONINI <0.03 <0.03    Iron/TIBC/Ferritin/ %Sat    Component Value Date/Time   IRON <10* 04/14/2013  1315   TIBC Not calculated due to Iron <10. 04/14/2013 1315   FERRITIN 100 04/14/2013 1315   IRONPCTSAT Not calculated due to Iron <10. 04/14/2013 1315     EKG Orders placed or performed during the hospital encounter of 12/04/14  . EKG 12-Lead  . EKG 12-Lead  . EKG     Prior Assessment and Plan Problem List as of 12/07/2014      Cardiovascular and Mediastinum   HTN (hypertension), benign   Hypotension   Chronic diastolic CHF (congestive heart failure)   Acute on chronic diastolic HF (heart failure)   Acute on chronic diastolic congestive heart failure   Acute on chronic diastolic heart failure     Respiratory   COPD (chronic obstructive pulmonary disease)   Acute-on-chronic respiratory failure     Endocrine   DM type 2 (diabetes mellitus, type 2)   Hypothyroidism (acquired)   Hypoglycemia   Hypothyroidism   Foot ulcer due to secondary DM   Diabetic ulcer of right foot   Diabetic ulcer of right great toe     Nervous and Auditory   Acute encephalopathy     Musculoskeletal and Integument   Boil     Genitourinary   UTI (lower urinary tract infection)   CKD (chronic kidney disease), stage III   Anemia in CKD (chronic kidney disease)   Acute renal failure superimposed on stage 3 chronic kidney disease   AKI (acute kidney injury)     Other   Morbid obesity   Hyponatremia   Leukocytosis   Chest pain   Dyspnea   Headache   Edema of lower extremity   Hyperkalemia   Bilateral edema of lower extremity  Cellulitis   Peripheral edema       Imaging: US Renal  12/04/2014   CLINICAL DATA:  Acute kidney injury.  EXAM: RENAL / URINARY TRACT ULTRASOUND COMPLETE  COMPARISON:  None.  FINDINGS: Right Kidney:  Length: 11.2 cm. Echogenicity within normal limits. No mass or hydronephrosis visualized.  Left Kidney:  Length: 13.7 cm. Echogenicity within normal limits. No mass or hydronephrosis visualized.  Bladder:  Decompressed secondary to Foley catheter.  IMPRESSION: No  definite renal abnormality seen.   Electronically Signed   By: Marijo Conception, M.D.   On: 12/04/2014 11:11   Mr Foot Right Wo Contrast  11/09/2014   CLINICAL DATA:  Wound on the right great toe for several weeks. Diabetic patient. Initial encounter.  EXAM: MRI OF THE RIGHT FOREFOOT WITHOUT CONTRAST  TECHNIQUE: Multiplanar, multisequence MR imaging was performed. No intravenous contrast was administered.  COMPARISON:  Plain films of the right great toe 11/07/2014.  FINDINGS: There is intense subcutaneous edema over the dorsum of the foot. The appears to be a soft tissue wound along the medial aspect of the distal right great toe. No focal fluid collection is identified. Mildly increased T2 signal is seen in the distal phalanx of the great toe. Bone marrow signal is otherwise unremarkable. All visualized tendons appear intact. Intrinsic musculature of the foot is atrophied. No mass identified.  IMPRESSION: Wound along the medial aspect of the right great toe. No abscess is identified. Mild marrow edema in the distal phalanx of the great toe is worrisome for osteomyelitis but could reflect reactive change.  Intense subcutaneous edema over the dorsum of the foot.  Atrophy of the intrinsic musculature of the foot.   Electronically Signed   By: Inge Rise M.D.   On: 11/09/2014 10:55   US Venous Img Lower Unilateral Right  11/28/2014   CLINICAL DATA:  Right lower extremity swelling.  EXAM: Right LOWER EXTREMITY VENOUS DOPPLER ULTRASOUND  TECHNIQUE: Gray-scale sonography with graded compression, as well as color Doppler and duplex ultrasound were performed to evaluate the lower extremity deep venous systems from the level of the common femoral vein and including the common femoral, femoral, profunda femoral, popliteal and calf veins including the posterior tibial, peroneal and gastrocnemius veins when visible. The superficial great saphenous vein was also interrogated. Spectral Doppler was utilized to evaluate  flow at rest and with distal augmentation maneuvers in the common femoral, femoral and popliteal veins.  COMPARISON:  None.  FINDINGS: Contralateral Common Femoral Vein: Respiratory phasicity is normal and symmetric with the symptomatic side. No evidence of thrombus. Normal compressibility.  Common Femoral Vein: No evidence of thrombus. Normal compressibility, respiratory phasicity and response to augmentation.  Saphenofemoral Junction: No evidence of thrombus. Normal compressibility and flow on color Doppler imaging.  Profunda Femoral Vein: No evidence of thrombus. Normal compressibility and flow on color Doppler imaging.  Femoral Vein: No evidence of thrombus. Normal compressibility, respiratory phasicity and response to augmentation.  Popliteal Vein: No evidence of thrombus. Normal compressibility, respiratory phasicity and response to augmentation.  Calf Veins: No evidence of thrombus. Normal compressibility and flow on color Doppler imaging.  Superficial Great Saphenous Vein: No evidence of thrombus. Normal compressibility and flow on color Doppler imaging.  Venous Reflux:  None.  Other Findings:  None.  IMPRESSION: No evidence of deep venous thrombosis seen in right lower extremity.   Electronically Signed   By: Marijo Conception, M.D.   On: 11/28/2014 14:44   US Arterial Seg Multiple  11/09/2014   CLINICAL DATA:  Right great toe diabetic ulcer for 5 weeks. Peripheral vascular disease, leg pain concern for claudication.  EXAM: NONINVASIVE PHYSIOLOGIC VASCULAR STUDY OF BILATERAL LOWER EXTREMITIES  TECHNIQUE: Evaluation of both lower extremities was performed at rest, including calculation of ankle-brachial indices, multiple segmental pressure evaluation, segmental Doppler and segmental pulse volume recording.  COMPARISON:  None.  FINDINGS: Limited exam. Toe pressures were not obtainable. Patient refused pressure measurements in the thigh and popliteal regions.  Right ABI:  1.32  Left ABI:  1.21  Right Lower  Extremity: Right lower thigh vessels are noncompressible compatible with peripheral atherosclerosis. Right posterior tibial tracing is triphasic. Right dorsalis pedis tracing is minimally dampened and biphasic. No significant waveform or pulse volume recording abnormality. Toe pressure is not obtained. Right ABI 1.32, likely falsely elevated related to peripheral calcific atherosclerosis.  Left Lower Extremity: Triphasic left posterior tibial and dorsalis pedis tracings. No significant pressure gradient. Symmetric pulse from recordings.  IMPRESSION: ABIs greater than 1 bilaterally, suspect falsely elevated related to peripheral atherosclerosis. No significant posterior tibial or dorsalis pedis waveform abnormality at either ankle. Symmetric pulse volume recordings.   Electronically Signed   By: Jerilynn Mages.  Shick M.D.   On: 11/09/2014 15:18   Dg Chest Port 1 View  12/04/2014   CLINICAL DATA:  Altered mental status. Intermittent midsternal chest pain starting today. Bilateral leg swelling and weakness.  EXAM: PORTABLE CHEST - 1 VIEW  COMPARISON:  Most recent chest radiograph 11/24/2014  FINDINGS: Lung volumes remain low. The patient is rotated to the left. Cardiomediastinal contours are unchanged with mild cardiomegaly. No evidence of pulmonary edema, confluent airspace disease, pleural effusion or pneumothorax. Minimal left basilar atelectasis.  IMPRESSION: Hypoventilatory chest with stable mild cardiomegaly.   Electronically Signed   By: Jeb Levering M.D.   On: 12/04/2014 06:04   Dg Chest Portable 1 View  11/24/2014   CLINICAL DATA:  Shortness of breath.  Fluid retention.  EXAM: PORTABLE CHEST - 1 VIEW  COMPARISON:  One-view chest 11/18/2014.  FINDINGS: The heart is enlarged. The patient is rotated to the right. There is no significant edema or effusion to suggest failure. No focal airspace disease is evident.  IMPRESSION: Stable cardiomegaly without failure.   Electronically Signed   By: San Morelle M.D.    On: 11/24/2014 09:19

## 2014-12-07 NOTE — Progress Notes (Signed)
Cardiology Office Note   Date:  12/07/2014   ID:  Stephanie Combs, DOB 1951-02-09, MRN [id number]  PCP:  Neale Burly, MD  Cardiologist:  Cloria Spring, NP   Chief Complaint  Patient presents with  . Congestive Heart Failure    Diastolic  . COPD  . Hypertension      History of Present Illness: Stephanie Combs is a 64 y.o. female who presents for and management.  Diastolic heart failure, hypertension, with history of COPD, and chronic chest pain.  The patient was recently hospitalized in the setting of decompensated diastolic CHF.  She was discharged on 11/28/2014, diuresis, 12 L, was weight on discharge, at 272.  She has a history of medical non-appearance.  The patient unfortunately, continues to smoke.  She was also treated for cellulitis of the distal phalanx of the great toe.  She was started on Levaquin and Augmentin.'Most recent echocardiogram dated 11/06/2013 demonstrated EF of 65-70%.  No wall motion abnormalities.  Grade 1 diastolic dysfunction  She comes today very anxious. She states that she is afraid she is going to die and wants the truth about her heart. She has HHN who visits her during the week who kekeps up with her blood sugar and BP. She weighs her as well. The patient is very emotional about the death of her husband and her brother, who she cared for until 18 months ago, before they died.  She has multiple questions and concerns. She is also followed by counseling services.    Past Medical History  Diagnosis Date  . Hypertension   . Diabetes mellitus without complication   . COPD (chronic obstructive pulmonary disease)   . CHF (congestive heart failure)     Diastolic  . Anxiety   . Chronic back pain   . Hypothyroidism   . Pedal edema   . On home O2     2.5 L N/C prn  . Degenerative disk disease     Past Surgical History  Procedure Laterality Date  . Tubal ligation    . Hernia repair       Current Outpatient Prescriptions  Medication Sig  Dispense Refill  . albuterol (PROAIR HFA) 108 (90 BASE) MCG/ACT inhaler Inhale 1 puff into the lungs every 4 (four) hours as needed. For shortness of breath (Patient taking differently: Inhale 2 puffs into the lungs every 4 (four) hours as needed. For shortness of breath) 1 Inhaler 1  . alprazolam (XANAX) 2 MG tablet Take 1 tablet (2 mg total) by mouth 3 (three) times daily as needed for anxiety.    Marland Kitchen aspirin EC 81 MG tablet Take 81 mg by mouth daily.    . benzonatate (TESSALON) 100 MG capsule Take 1 capsule (100 mg total) by mouth 3 (three) times daily as needed for cough. (Patient taking differently: Take 200 mg by mouth 2 (two) times daily as needed for cough. ) 21 capsule 0  . Bisacodyl (FLEET LAXATIVE RE) Place 1 application rectally daily as needed (constipation).    Marland Kitchen diltiazem (CARDIZEM CD) 240 MG 24 hr capsule Take 1 capsule (240 mg total) by mouth daily. 30 capsule 2  . diphenhydrAMINE (BENADRYL) 25 MG tablet Take 25 mg by mouth every 6 (six) hours as needed for allergies.    Marland Kitchen empagliflozin (JARDIANCE) 25 MG TABS tablet Take 25 mg by mouth daily.    . furosemide (LASIX) 20 MG tablet Take 2 tablets (40 mg total) by mouth 2 (two) times daily. [place]  tablet 1  . gabapentin (NEURONTIN) 600 MG tablet Take 1 tablet (600 mg total) by mouth 4 (four) times daily. (Patient taking differently: Take 1,200 mg by mouth 2 (two) times daily. ) 60 tablet 2  . insulin aspart (NOVOLOG) 100 UNIT/ML injection Inject 0-20 Units into the skin 3 (three) times daily with meals. Takes 20 Units in am and 20 Units at night.  Per sliding scale    . insulin detemir (LEVEMIR) 100 UNIT/ML injection Inject 0.2 mLs (20 Units total) into the skin at bedtime. 10 mL 11  . ipratropium-albuterol (DUONEB) 0.5-2.5 (3) MG/3ML SOLN Take 3 mLs by nebulization every 6 (six) hours. 360 mL 2  . levothyroxine (SYNTHROID, LEVOTHROID) 125 MCG tablet Take 2 tablets (250 mcg total) by mouth daily before breakfast. 30 tablet 2  . meclizine  (ANTIVERT) 25 MG tablet Take 25 mg by mouth 3 (three) times daily as needed for dizziness.     . metFORMIN (GLUCOPHAGE) 500 MG tablet Take 500 mg by mouth 2 (two) times daily with a meal.    . nicotine (NICODERM CQ - DOSED IN MG/24 HOURS) 21 mg/24hr patch Place 1 patch (21 mg total) onto the skin daily. 28 patch 0  . nystatin (MYCOSTATIN/NYSTOP) 100000 UNIT/GM POWD Apply 1 g topically 2 (two) times daily.    . Omega-3 Fatty Acids (FISH OIL PO) Take 1 capsule by mouth 2 (two) times daily.     . ondansetron (ZOFRAN ODT) 8 MG disintegrating tablet Take 1 tablet (8 mg total) by mouth every 8 (eight) hours as needed for nausea or vomiting. 10 tablet 0  . pantoprazole (PROTONIX) 40 MG tablet Take 1 tablet (40 mg total) by mouth 2 (two) times daily. 60 tablet 0  . potassium chloride (K-DUR) 10 MEQ tablet Take 20 mEq by mouth 2 (two) times daily.     Marland Kitchen tiZANidine (ZANAFLEX) 4 MG tablet Take 4 mg by mouth 4 (four) times daily.     No current facility-administered medications for this visit.    Allergies:   Dilaudid; Actifed cold-allergy; Other; Penicillins; Reglan; Valium; and Vistaril    Social History:  The patient  reports that she has been smoking Cigarettes.  She has been smoking about 0.25 packs per day. She has never used smokeless tobacco. She reports that she does not drink alcohol or use illicit drugs.   Family History:  The patient's family history includes Cerebral palsy in her brother; Diabetes in her brother and other; Heart attack in her father and other; Pneumonia in her brother; Stroke in her mother.    ROS: .   All other systems are reviewed and negative.Unless otherwise mentioned in H&P above.   PHYSICAL EXAM: VS:  BP 110/60 mmHg  Pulse 87  Ht '5\' 7"'$  (1.702 m)  Wt 275 lb 3.2 oz (124.83 kg)  BMI 43.09 kg/m2  SpO2 94% , BMI Body mass index is 43.09 kg/(m^2). GEN: Well nourished, well developed, in no acute distressMorbidly obese HEENT: normal Neck: no JVD, carotid bruits, or  masses Cardiac: RRR; no murmurs, rubs, or gallops,no edema  Respiratory:  clear to auscultation bilaterally, normal work of breathing GI: soft, nontender, nondistended, + BS MS: no deformity or atrophyUses a walker for ambulation Skin: warm and dry, no rash Neuro:  Strength and sensation are intact Psych: euthymic mood, full affect  Recent Labs: 11/07/2014: TSH 1.168 12/04/2014: ALT 15; B Natriuretic Peptide 50.0 12/06/2014: BUN 28*; Creatinine, Ser 1.18*; Hemoglobin 10.2*; Platelets 256; Potassium 3.6; Sodium 140  Wt Readings from Last 3 Encounters:  12/07/14 275 lb 3.2 oz (124.83 kg)  12/05/14 281 lb 8 oz (127.688 kg)  11/28/14 277 lb 9 oz (125.9 kg)      Other studies Reviewed: Additional studies/ records that were reviewed today include: Recent hospitalization. Review of the above records demonstrates: Improvement in kidney fx and wt. Echo as described above.    ASSESSMENT AND PLAN:  1. Chronic Diastolic CHF:  Currently she is well compensated. She is adhering to a low sodium diet. She  Is adamant about this. She occasionally eats peanut butter when her blood glucose is low. She will continue on furosemide 40 mg BID. She is to see her PCP next month who is planning on drawing labs. She will continue daily wts and low dodium diet. I have spent over 25 minutes with this patient going over her heart function, labs, and medications. She is given a lot of reassurance.   2. Hypertension: Currently well controlled. I have reassured her about this.   3. Diabetes: She complains that her blood sugar is not stable. Sometimes low in the mornings and other times, very high. I have referred her to Endocrinologist at her request. She states that her PCP has not referred her before. PCP to follow up on referral.  4.Chronic Pain: She is requesting pain medications. I have explained to her that cardiology will not be managing her pain control and that she will need to follow up with her PCP for  ongoing management or referral.  5.Anxiety and Depression: She is requesting that I write a letter stating that she needs to stay away from stress and send this to PCP. I have referred her back to PCP and encouraged her to continue counseling services.   6. Ongoing Tobacco abuse: At first she denies this,but then admits that she still smokes to help her anxiety. She wants to cut down. I have encouraged this.   Current medicines are reviewed at length with the patient today.    Labs/ tests ordered today include: None No orders of the defined types were placed in this encounter.     Disposition:   FU with cardiology in 6 months. I have spent over 25 minutes answering questions and giving reassurance to this patient and answering multiple questions.   Signed, Jory Sims, NP  12/07/2014 2:23 PM    [facility] [address], [place], [place] [address] Phone: [phone]; Fax: [phone]

## 2015-02-26 ENCOUNTER — Ambulatory Visit: Payer: Self-pay | Admitting: "Endocrinology

## 2015-03-24 ENCOUNTER — Inpatient Hospital Stay (HOSPITAL_COMMUNITY)
Admission: EM | Admit: 2015-03-24 | Discharge: 2015-03-25 | DRG: 603 | Disposition: A | Discharge status: Home / Self Care | Payer: Medicare Other | Attending: Internal Medicine | Admitting: Internal Medicine

## 2015-03-24 ENCOUNTER — Encounter (HOSPITAL_COMMUNITY): Payer: Self-pay | Admitting: Cardiology

## 2015-03-24 DIAGNOSIS — Z833 Family history of diabetes mellitus: Secondary | ICD-10-CM

## 2015-03-24 DIAGNOSIS — I5032 Chronic diastolic (congestive) heart failure: Secondary | ICD-10-CM | POA: Diagnosis present

## 2015-03-24 DIAGNOSIS — M79671 Pain in right foot: Secondary | ICD-10-CM | POA: Diagnosis present

## 2015-03-24 DIAGNOSIS — E1142 Type 2 diabetes mellitus with diabetic polyneuropathy: Secondary | ICD-10-CM | POA: Diagnosis present

## 2015-03-24 DIAGNOSIS — L97519 Non-pressure chronic ulcer of other part of right foot with unspecified severity: Secondary | ICD-10-CM

## 2015-03-24 DIAGNOSIS — Z794 Long term (current) use of insulin: Secondary | ICD-10-CM

## 2015-03-24 DIAGNOSIS — L03115 Cellulitis of right lower limb: Principal | ICD-10-CM | POA: Diagnosis present

## 2015-03-24 DIAGNOSIS — Z8249 Family history of ischemic heart disease and other diseases of the circulatory system: Secondary | ICD-10-CM | POA: Diagnosis not present

## 2015-03-24 DIAGNOSIS — E1169 Type 2 diabetes mellitus with other specified complication: Secondary | ICD-10-CM | POA: Diagnosis present

## 2015-03-24 DIAGNOSIS — E669 Obesity, unspecified: Secondary | ICD-10-CM | POA: Diagnosis present

## 2015-03-24 DIAGNOSIS — R7989 Other specified abnormal findings of blood chemistry: Secondary | ICD-10-CM | POA: Diagnosis present

## 2015-03-24 DIAGNOSIS — F17210 Nicotine dependence, cigarettes, uncomplicated: Secondary | ICD-10-CM | POA: Diagnosis present

## 2015-03-24 DIAGNOSIS — Z66 Do not resuscitate: Secondary | ICD-10-CM | POA: Diagnosis present

## 2015-03-24 DIAGNOSIS — M86679 Other chronic osteomyelitis, unspecified ankle and foot: Secondary | ICD-10-CM | POA: Diagnosis present

## 2015-03-24 DIAGNOSIS — J441 Chronic obstructive pulmonary disease with (acute) exacerbation: Secondary | ICD-10-CM | POA: Diagnosis present

## 2015-03-24 DIAGNOSIS — E11621 Type 2 diabetes mellitus with foot ulcer: Secondary | ICD-10-CM | POA: Diagnosis present

## 2015-03-24 DIAGNOSIS — Z9981 Dependence on supplemental oxygen: Secondary | ICD-10-CM | POA: Diagnosis not present

## 2015-03-24 DIAGNOSIS — R791 Abnormal coagulation profile: Secondary | ICD-10-CM

## 2015-03-24 DIAGNOSIS — E039 Hypothyroidism, unspecified: Secondary | ICD-10-CM | POA: Diagnosis present

## 2015-03-24 DIAGNOSIS — I110 Hypertensive heart disease with heart failure: Secondary | ICD-10-CM | POA: Diagnosis present

## 2015-03-24 DIAGNOSIS — Z823 Family history of stroke: Secondary | ICD-10-CM

## 2015-03-24 DIAGNOSIS — M7989 Other specified soft tissue disorders: Secondary | ICD-10-CM

## 2015-03-24 LAB — CBC WITH DIFFERENTIAL/PLATELET
Basophils Absolute: 0.1 10*3/uL (ref 0.0–0.1)
Basophils Relative: 1 %
Eosinophils Absolute: 0.2 10*3/uL (ref 0.0–0.7)
Eosinophils Relative: 2 %
HCT: 34.3 % — ABNORMAL LOW (ref 36.0–46.0)
Hemoglobin: 10.6 g/dL — ABNORMAL LOW (ref 12.0–15.0)
Lymphocytes Relative: 29 %
Lymphs Abs: 2.7 10*3/uL (ref 0.7–4.0)
MCH: 26.2 pg (ref 26.0–34.0)
MCHC: 30.9 g/dL (ref 30.0–36.0)
MCV: 84.9 fL (ref 78.0–100.0)
Monocytes Absolute: 0.9 10*3/uL (ref 0.1–1.0)
Monocytes Relative: 10 %
Neutro Abs: 5.4 10*3/uL (ref 1.7–7.7)
Neutrophils Relative %: 58 %
Platelets: 224 10*3/uL (ref 150–400)
RBC: 4.04 MIL/uL (ref 3.87–5.11)
RDW: 17.3 % — ABNORMAL HIGH (ref 11.5–15.5)
WBC: 9.2 10*3/uL (ref 4.0–10.5)

## 2015-03-24 LAB — COMPREHENSIVE METABOLIC PANEL
ALT: 9 U/L — ABNORMAL LOW (ref 14–54)
AST: 12 U/L — ABNORMAL LOW (ref 15–41)
Albumin: 3.1 g/dL — ABNORMAL LOW (ref 3.5–5.0)
Alkaline Phosphatase: 75 U/L (ref 38–126)
Anion gap: 8 (ref 5–15)
BUN: 21 mg/dL — ABNORMAL HIGH (ref 6–20)
CO2: 26 mmol/L (ref 22–32)
Calcium: 8.7 mg/dL — ABNORMAL LOW (ref 8.9–10.3)
Chloride: 105 mmol/L (ref 101–111)
Creatinine, Ser: 1.15 mg/dL — ABNORMAL HIGH (ref 0.44–1.00)
GFR calc Af Amer: 57 mL/min — ABNORMAL LOW (ref >60)
GFR calc non Af Amer: 49 mL/min — ABNORMAL LOW (ref >60)
Glucose, Bld: 94 mg/dL (ref 65–99)
Potassium: 4.6 mmol/L (ref 3.5–5.1)
Sodium: 139 mmol/L (ref 135–145)
Total Bilirubin: 0.3 mg/dL (ref 0.3–1.2)
Total Protein: 6.3 g/dL — ABNORMAL LOW (ref 6.5–8.1)

## 2015-03-24 LAB — BRAIN NATRIURETIC PEPTIDE: B Natriuretic Peptide: 170 pg/mL — ABNORMAL HIGH (ref 0.0–100.0)

## 2015-03-24 LAB — I-STAT CG4 LACTIC ACID, ED: Lactic Acid, Venous: 1.04 mmol/L (ref 0.5–2.0)

## 2015-03-24 LAB — GLUCOSE, CAPILLARY: Glucose-Capillary: 317 mg/dL — ABNORMAL HIGH (ref 65–99)

## 2015-03-24 LAB — D-DIMER, QUANTITATIVE: D-Dimer, Quant: 0.5 ug/mL-FEU — ABNORMAL HIGH (ref 0.00–0.48)

## 2015-03-24 LAB — TROPONIN I: Troponin I: <0.03 ng/mL (ref <0.031)

## 2015-03-24 MED ORDER — GABAPENTIN 600 MG PO TABS
1200.0000 mg | ORAL_TABLET | Freq: Two times a day (BID) | ORAL | Status: DC
  Filled 2015-03-24 (×4): qty 2

## 2015-03-24 MED ORDER — ALBUTEROL SULFATE (2.5 MG/3ML) 0.083% IN NEBU: 3.0000 mL | INHALATION_SOLUTION | RESPIRATORY_TRACT | Status: DC | PRN

## 2015-03-24 MED ORDER — INSULIN ASPART 100 UNIT/ML ~~LOC~~ SOLN
0.0000 [IU] | Freq: Three times a day (TID) | SUBCUTANEOUS | Status: DC
  Administered 2015-03-25 (×2): 20 [IU] via SUBCUTANEOUS

## 2015-03-24 MED ORDER — DOCUSATE SODIUM 100 MG PO CAPS
100.0000 mg | ORAL_CAPSULE | Freq: Two times a day (BID) | ORAL | Status: DC
  Administered 2015-03-24 – 2015-03-25 (×2): 100 mg via ORAL
  Filled 2015-03-24 (×2): qty 1

## 2015-03-24 MED ORDER — ASPIRIN EC 81 MG PO TBEC
81.0000 mg | DELAYED_RELEASE_TABLET | Freq: Every day | ORAL | Status: DC
Start: 2015-03-25 — End: 2015-03-25
  Administered 2015-03-25: 81 mg via ORAL
  Filled 2015-03-24: qty 1

## 2015-03-24 MED ORDER — FUROSEMIDE 40 MG PO TABS
40.0000 mg | ORAL_TABLET | Freq: Two times a day (BID) | ORAL | Status: DC
  Administered 2015-03-24 – 2015-03-25 (×2): 40 mg via ORAL
  Filled 2015-03-24 (×2): qty 1

## 2015-03-24 MED ORDER — NYSTATIN 100000 UNIT/GM EX POWD
CUTANEOUS | Status: AC
  Filled 2015-03-24: qty 15

## 2015-03-24 MED ORDER — DILTIAZEM HCL ER COATED BEADS 240 MG PO CP24
240.0000 mg | ORAL_CAPSULE | Freq: Every day | ORAL | Status: DC
Start: 2015-03-25 — End: 2015-03-25
  Administered 2015-03-25: 240 mg via ORAL
  Filled 2015-03-24: qty 1

## 2015-03-24 MED ORDER — ONDANSETRON 4 MG PO TBDP
8.0000 mg | ORAL_TABLET | Freq: Three times a day (TID) | ORAL | Status: DC | PRN
  Administered 2015-03-25: 8 mg via ORAL
  Filled 2015-03-24: qty 2

## 2015-03-24 MED ORDER — OXYCODONE HCL 5 MG PO TABS
10.0000 mg | ORAL_TABLET | ORAL | Status: DC | PRN
  Administered 2015-03-24 – 2015-03-25 (×2): 10 mg via ORAL
  Filled 2015-03-24 (×2): qty 2

## 2015-03-24 MED ORDER — ALUM & MAG HYDROXIDE-SIMETH 200-200-20 MG/5ML PO SUSP: 30.0000 mL | Freq: Four times a day (QID) | ORAL | Status: DC | PRN

## 2015-03-24 MED ORDER — TIZANIDINE HCL 4 MG PO TABS
4.0000 mg | ORAL_TABLET | Freq: Four times a day (QID) | ORAL | Status: DC | PRN
  Administered 2015-03-24: 4 mg via ORAL
  Filled 2015-03-24: qty 1

## 2015-03-24 MED ORDER — IPRATROPIUM BROMIDE 0.02 % IN SOLN
0.5000 mg | RESPIRATORY_TRACT | Status: AC
  Administered 2015-03-24: 0.5 mg via RESPIRATORY_TRACT
  Filled 2015-03-24: qty 2.5

## 2015-03-24 MED ORDER — INSULIN ASPART 100 UNIT/ML ~~LOC~~ SOLN
0.0000 [IU] | Freq: Every day | SUBCUTANEOUS | Status: DC
  Administered 2015-03-24: 5 [IU] via SUBCUTANEOUS

## 2015-03-24 MED ORDER — GABAPENTIN 300 MG PO CAPS
ORAL_CAPSULE | ORAL | Status: AC
  Filled 2015-03-24: qty 4

## 2015-03-24 MED ORDER — LEVOTHYROXINE SODIUM 50 MCG PO TABS
250.0000 ug | ORAL_TABLET | Freq: Every day | ORAL | Status: DC
  Administered 2015-03-25: 250 ug via ORAL
  Filled 2015-03-24: qty 1
  Filled 2015-03-24: qty 2

## 2015-03-24 MED ORDER — METFORMIN HCL 500 MG PO TABS
500.0000 mg | ORAL_TABLET | Freq: Two times a day (BID) | ORAL | Status: DC
  Administered 2015-03-25: 500 mg via ORAL
  Filled 2015-03-24: qty 1

## 2015-03-24 MED ORDER — CLINDAMYCIN PHOSPHATE 600 MG/50ML IV SOLN
600.0000 mg | Freq: Three times a day (TID) | INTRAVENOUS | Status: DC
  Administered 2015-03-25 (×2): 600 mg via INTRAVENOUS
  Filled 2015-03-24 (×5): qty 50

## 2015-03-24 MED ORDER — SENNOSIDES-DOCUSATE SODIUM 8.6-50 MG PO TABS: 1.0000 | ORAL_TABLET | Freq: Every evening | ORAL | Status: DC | PRN

## 2015-03-24 MED ORDER — NICOTINE 21 MG/24HR TD PT24
21.0000 mg | MEDICATED_PATCH | Freq: Every day | TRANSDERMAL | Status: DC
  Administered 2015-03-24 – 2015-03-25 (×2): 21 mg via TRANSDERMAL
  Filled 2015-03-24 (×2): qty 1

## 2015-03-24 MED ORDER — CLINDAMYCIN PHOSPHATE 600 MG/50ML IV SOLN
600.0000 mg | Freq: Once | INTRAVENOUS | Status: AC
  Administered 2015-03-24: 600 mg via INTRAVENOUS
  Filled 2015-03-24: qty 50

## 2015-03-24 MED ORDER — CLINDAMYCIN PHOSPHATE 600 MG/50ML IV SOLN
INTRAVENOUS | Status: AC
  Filled 2015-03-24: qty 50

## 2015-03-24 MED ORDER — ENOXAPARIN SODIUM 120 MG/0.8ML ~~LOC~~ SOLN
1.0000 mg/kg | Freq: Two times a day (BID) | SUBCUTANEOUS | Status: DC
  Administered 2015-03-25: 120 mg via SUBCUTANEOUS
  Filled 2015-03-24 (×4): qty 0.8

## 2015-03-24 MED ORDER — GABAPENTIN 300 MG PO CAPS
1200.0000 mg | ORAL_CAPSULE | Freq: Two times a day (BID) | ORAL | Status: DC
  Administered 2015-03-24: 1200 mg via ORAL

## 2015-03-24 MED ORDER — PANTOPRAZOLE SODIUM 40 MG PO TBEC
40.0000 mg | DELAYED_RELEASE_TABLET | Freq: Two times a day (BID) | ORAL | Status: DC
Start: 2015-03-24 — End: 2015-03-25
  Administered 2015-03-24 – 2015-03-25 (×2): 40 mg via ORAL
  Filled 2015-03-24 (×2): qty 1

## 2015-03-24 MED ORDER — POTASSIUM CHLORIDE ER 10 MEQ PO TBCR
20.0000 meq | EXTENDED_RELEASE_TABLET | Freq: Two times a day (BID) | ORAL | Status: DC
  Administered 2015-03-24 – 2015-03-25 (×2): 20 meq via ORAL
  Filled 2015-03-24 (×6): qty 2

## 2015-03-24 MED ORDER — ACETAMINOPHEN 650 MG RE SUPP: 650.0000 mg | Freq: Four times a day (QID) | RECTAL | Status: DC | PRN

## 2015-03-24 MED ORDER — ALBUTEROL (5 MG/ML) CONTINUOUS INHALATION SOLN
10.0000 mg/h | INHALATION_SOLUTION | RESPIRATORY_TRACT | Status: DC
  Administered 2015-03-24: 10 mg/h via RESPIRATORY_TRACT
  Filled 2015-03-24: qty 20

## 2015-03-24 MED ORDER — ACETAMINOPHEN 325 MG PO TABS: 650.0000 mg | ORAL_TABLET | Freq: Four times a day (QID) | ORAL | Status: DC | PRN

## 2015-03-24 MED ORDER — NYSTATIN 100000 UNIT/GM EX POWD
1.0000 g | Freq: Two times a day (BID) | CUTANEOUS | Status: DC
  Administered 2015-03-25: 1 g via TOPICAL
  Filled 2015-03-24: qty 15

## 2015-03-24 MED ORDER — VANCOMYCIN HCL IN DEXTROSE 1-5 GM/200ML-% IV SOLN
1000.0000 mg | Freq: Once | INTRAVENOUS | Status: DC
  Filled 2015-03-24: qty 200

## 2015-03-24 MED ORDER — METHYLPREDNISOLONE SODIUM SUCC 125 MG IJ SOLR
125.0000 mg | Freq: Once | INTRAMUSCULAR | Status: AC
  Administered 2015-03-24: 125 mg via INTRAVENOUS
  Filled 2015-03-24: qty 2

## 2015-03-24 MED ORDER — LEVOFLOXACIN IN D5W 750 MG/150ML IV SOLN
750.0000 mg | INTRAVENOUS | Status: DC
  Administered 2015-03-24: 750 mg via INTRAVENOUS
  Filled 2015-03-24: qty 150

## 2015-03-24 MED ORDER — INSULIN DETEMIR 100 UNIT/ML ~~LOC~~ SOLN
40.0000 [IU] | Freq: Every day | SUBCUTANEOUS | Status: DC
  Administered 2015-03-24: 40 [IU] via SUBCUTANEOUS
  Filled 2015-03-24 (×2): qty 0.4

## 2015-03-24 MED ORDER — EMPAGLIFLOZIN 25 MG PO TABS
25.0000 mg | ORAL_TABLET | Freq: Every day | ORAL | Status: DC
  Filled 2015-03-24 (×2): qty 25

## 2015-03-24 MED ORDER — ALPRAZOLAM 1 MG PO TABS
2.0000 mg | ORAL_TABLET | Freq: Three times a day (TID) | ORAL | Status: DC | PRN
  Administered 2015-03-24 – 2015-03-25 (×2): 2 mg via ORAL
  Filled 2015-03-24 (×2): qty 2

## 2015-03-24 MED ORDER — NAPROXEN SODIUM 220 MG PO TABS: 220.0000 mg | ORAL_TABLET | Freq: Every day | ORAL | Status: DC | PRN

## 2015-03-24 MED ORDER — IPRATROPIUM-ALBUTEROL 0.5-2.5 (3) MG/3ML IN SOLN: 3.0000 mL | Freq: Three times a day (TID) | RESPIRATORY_TRACT | Status: DC | PRN

## 2015-03-24 MED ORDER — ALPRAZOLAM 1 MG PO TABS: 2.0000 mg | ORAL_TABLET | Freq: Three times a day (TID) | ORAL | Status: DC | PRN

## 2015-03-24 NOTE — ED Notes (Signed)
EMS reports pt c/o intermittent chest pain x 3 days.  ALso has wound to r toe and is on antibiotics.  Reports r calf swelling per ems.

## 2015-03-24 NOTE — ED Provider Notes (Signed)
CSN: [id number]     Arrival date & time 03/24/15  1457 History   First MD Initiated Contact with Patient 03/24/15 1503     Chief Complaint  Patient presents with  . Chest Pain  . Wound Check     (Consider location/radiation/quality/duration/timing/severity/associated sxs/prior Treatment) HPI Comments: The patient is a 64 year old female, she is obese, she has a history of diastolic congestive heart failure (last echocardiogram in June 2015 had normal ejection fraction with grade 1 diastolic dysfunction), history of COPD (still continues to smoke). She presents to the hospital with a complaint of right lower extremity redness and swelling which is gradually worsening and is now become severe. She also has a complaint of chest pain and of note has been noted to have chronic chest pain. She states is more complaining about her red leg swelling, pain, fever as high as 104 in the evenings. The symptoms are persistent, gradually worsening and have now become severe. She is artery on antibiotics per her report.  Patient is a 64 y.o. female presenting with chest pain and wound check.  Chest Pain Wound Check Associated symptoms include chest pain.    Past Medical History  Diagnosis Date  . Hypertension   . Diabetes mellitus without complication ([place])   . COPD (chronic obstructive pulmonary disease) ([place])   . CHF (congestive heart failure) ([facility])     Diastolic  . Anxiety   . Chronic back pain   . Hypothyroidism   . Pedal edema   . On home O2     2.5 L N/C prn  . Degenerative disk disease    Past Surgical History  Procedure Laterality Date  . Tubal ligation    . Hernia repair     Family History  Problem Relation Age of Onset  . Stroke Mother   . Heart attack Father   . Diabetes Brother   . Cerebral palsy Brother   . Pneumonia Brother   . Diabetes Other   . Heart attack Other    Social History  Substance Use Topics  . Smoking status: Current Every Day Smoker -- 0.25 packs/day     Types: Cigarettes  . Smokeless tobacco: Never Used  . Alcohol Use: No   OB History    No data available     Review of Systems  Cardiovascular: Positive for chest pain.  All other systems reviewed and are negative.     Allergies  Dilaudid; Actifed cold-allergy; Other; Penicillins; Reglan; Valium; and Vistaril  Home Medications   Prior to Admission medications   Medication Sig Start Date End Date Taking? Authorizing Provider  albuterol (PROAIR HFA) 108 (90 BASE) MCG/ACT inhaler Inhale 1 puff into the lungs every 4 (four) hours as needed. For shortness of breath Patient taking differently: Inhale 2 puffs into the lungs every 4 (four) hours as needed. For shortness of breath 05/30/13  Yes Kinnie Feil, MD  alprazolam Duanne Moron) 2 MG tablet Take 1 tablet (2 mg total) by mouth 3 (three) times daily as needed for anxiety. 11/10/14  Yes Radene Gunning, NP  aspirin EC 81 MG tablet Take 81 mg by mouth daily.   Yes Historical Provider, MD  benzonatate (TESSALON) 100 MG capsule Take 1 capsule (100 mg total) by mouth 3 (three) times daily as needed for cough. Patient taking differently: Take 200 mg by mouth 2 (two) times daily as needed for cough.  12/31/13  Yes Jola Schmidt, MD  Bisacodyl (FLEET LAXATIVE RE) Place 1 application rectally daily  as needed (constipation).   Yes Historical Provider, MD  diltiazem (CARDIZEM CD) 240 MG 24 hr capsule Take 1 capsule (240 mg total) by mouth daily. 05/30/13  Yes Kinnie Feil, MD  empagliflozin (JARDIANCE) 25 MG TABS tablet Take 25 mg by mouth daily.   Yes Historical Provider, MD  furosemide (LASIX) 20 MG tablet Take 2 tablets (40 mg total) by mouth 2 (two) times daily. 12/06/14  Yes Estela Leonie Green, MD  gabapentin (NEURONTIN) 600 MG tablet Take 1 tablet (600 mg total) by mouth 4 (four) times daily. Patient taking differently: Take 1,200 mg by mouth 2 (two) times daily.  05/30/13  Yes Kinnie Feil, MD  insulin aspart (NOVOLOG) 100 UNIT/ML  injection Inject 0-20 Units into the skin 3 (three) times daily with meals. Takes 20 Units in am and 20 Units at night.  Per sliding scale 05/30/13  Yes Kinnie Feil, MD  insulin detemir (LEVEMIR) 100 UNIT/ML injection Inject 0.2 mLs (20 Units total) into the skin at bedtime. Patient taking differently: Inject 40 Units into the skin at bedtime.  12/06/14  Yes Erline Hau, MD  ipratropium-albuterol (DUONEB) 0.5-2.5 (3) MG/3ML SOLN Take 3 mLs by nebulization every 6 (six) hours. Patient taking differently: Take 3 mLs by nebulization 3 (three) times daily. *may also use as needed* 05/30/13  Yes Kinnie Feil, MD  levothyroxine (SYNTHROID, LEVOTHROID) 125 MCG tablet Take 2 tablets (250 mcg total) by mouth daily before breakfast. 05/30/13  Yes Kinnie Feil, MD  metFORMIN (GLUCOPHAGE) 500 MG tablet Take 500 mg by mouth 2 (two) times daily with a meal.   Yes Historical Provider, MD  naproxen sodium (ALEVE) 220 MG tablet Take 220 mg by mouth daily as needed (for pain).   Yes Historical Provider, MD  nicotine (NICODERM CQ - DOSED IN MG/24 HOURS) 21 mg/24hr patch Place 1 patch (21 mg total) onto the skin daily. 05/30/13  Yes Kinnie Feil, MD  nystatin (MYCOSTATIN/NYSTOP) 100000 UNIT/GM POWD Apply 1 g topically 2 (two) times daily.   Yes Historical Provider, MD  Omega-3 Fatty Acids (FISH OIL PO) Take 1 capsule by mouth 2 (two) times daily.    Yes Historical Provider, MD  ondansetron (ZOFRAN ODT) 8 MG disintegrating tablet Take 1 tablet (8 mg total) by mouth every 8 (eight) hours as needed for nausea or vomiting. 12/31/13  Yes Jola Schmidt, MD  pantoprazole (PROTONIX) 40 MG tablet Take 1 tablet (40 mg total) by mouth 2 (two) times daily. 11/10/14  Yes Lezlie Octave Black, NP  potassium chloride (K-DUR) 10 MEQ tablet Take 20 mEq by mouth 2 (two) times daily.    Yes Historical Provider, MD  tiZANidine (ZANAFLEX) 4 MG tablet Take 4 mg by mouth 4 (four) times daily.   Yes Historical Provider, MD   diphenhydrAMINE (BENADRYL) 25 MG tablet Take 25 mg by mouth every 6 (six) hours as needed for allergies.    Historical Provider, MD  meclizine (ANTIVERT) 25 MG tablet Take 25 mg by mouth 3 (three) times daily as needed for dizziness.  04/06/13   Historical Provider, MD   BP 136/53 mmHg  Pulse 78  Temp(Src) 97.8 F (36.6 C) (Oral)  Resp 29  Ht '5\' 6"'$  (1.676 m)  Wt 236 lb (107.049 kg)  BMI 38.11 kg/m2  SpO2 95% Physical Exam  Constitutional: She appears well-developed and well-nourished. She appears distressed ( increased RR).  HENT:  Head: Normocephalic and atraumatic.  Mouth/Throat: Oropharynx is clear and moist. No  oropharyngeal exudate.  Eyes: Conjunctivae and EOM are normal. Pupils are equal, round, and reactive to light. Right eye exhibits no discharge. Left eye exhibits no discharge. No scleral icterus.  Neck: Normal range of motion. Neck supple. No JVD present. No thyromegaly present.  Cardiovascular: Normal rate, regular rhythm, normal heart sounds and intact distal pulses.  Exam reveals no gallop and no friction rub.   No murmur heard. Pulmonary/Chest: Effort normal. No respiratory distress. She has wheezes. She has rales ( scattered ).  Abdominal: Soft. Bowel sounds are normal. She exhibits no distension and no mass. There is no tenderness.  Musculoskeletal: Normal range of motion. She exhibits edema ( bilateral edema of the LE's - R>L with redness of the R ankle and foot - circumferential at the anikle and foot ). She exhibits no tenderness.  Lymphadenopathy:    She has no cervical adenopathy.  Neurological: She is alert. Coordination normal.  Skin: Skin is warm and dry. No rash noted. No erythema.  Ulcer on great toe - no drainage  Psychiatric: She has a normal mood and affect. Her behavior is normal.  Nursing note and vitals reviewed.   ED Course  Procedures (including critical care time) Labs Review Labs Reviewed  CBC WITH DIFFERENTIAL/PLATELET - Abnormal; Notable  for the following:    Hemoglobin 10.6 (*)    HCT 34.3 (*)    RDW 17.3 (*)    All other components within normal limits  COMPREHENSIVE METABOLIC PANEL - Abnormal; Notable for the following:    BUN 21 (*)    Creatinine, Ser 1.15 (*)    Calcium 8.7 (*)    Total Protein 6.3 (*)    Albumin 3.1 (*)    AST 12 (*)    ALT 9 (*)    GFR calc non Af Amer 49 (*)    GFR calc Af Amer 57 (*)    All other components within normal limits  BRAIN NATRIURETIC PEPTIDE - Abnormal; Notable for the following:    B Natriuretic Peptide 170.0 (*)    All other components within normal limits  D-DIMER, QUANTITATIVE (NOT AT [facility]) - Abnormal; Notable for the following:    D-Dimer, Quant 0.50 (*)    All other components within normal limits  TROPONIN I  I-STAT CG4 LACTIC ACID, ED    Imaging Review No results found. I have personally reviewed and evaluated these images and lab results as part of my medical decision-making.   EKG Interpretation   Date/Time:  Saturday March 24 2015 15:05:49 EST Ventricular Rate:  80 PR Interval:  165 QRS Duration: 85 QT Interval:  377 QTC Calculation: 435 R Axis:   86 Text Interpretation:  Sinus rhythm Borderline right axis deviation since  last tracing no significant change Confirmed by Navya Timmons  MD, Yurika Pereda ([id number])  on 03/24/2015 3:42:05 PM      MDM   Final diagnoses:  Cellulitis of right foot  COPD exacerbation ([facility])    There is findings of COPD exacerbation - she also has CP - investigate with labs and CT - she has redness spreading up her leg with some increased swelling and redness of the R leg.  Added on d dimer - no leukocytosis -   Given neb - slight improvement  antiboitcs for LE redness,  Will d/w hospitalist.  D/w Dr. Nehemiah Settle who will admit.  Meds given in ED:  Medications  albuterol (PROVENTIL,VENTOLIN) solution continuous neb (10 mg/hr Nebulization New Bag/Given 03/24/15 1543)  clindamycin (CLEOCIN) IVPB 600 mg (  600 mg Intravenous New  Bag/Given 03/24/15 1654)  ipratropium (ATROVENT) nebulizer solution 0.5 mg (0.5 mg Nebulization Given 03/24/15 1543)  methylPREDNISolone sodium succinate (SOLU-MEDROL) 125 mg/2 mL injection 125 mg (125 mg Intravenous Given 03/24/15 1535)      Noemi Chapel, MD 03/24/15 1702

## 2015-03-24 NOTE — ED Notes (Signed)
R leg swollen, calf red and warm to touch.  Pedal pulse audible with doppler.

## 2015-03-24 NOTE — ED Notes (Signed)
cbg 143 per ems.

## 2015-03-24 NOTE — H&P (Signed)
History and Physical  Stephanie Combs IRS:[id number] DOB: 1950/11/11 DOA: 03/24/2015  Referring physician: Dr Sabra Heck, ED physician PCP: Neale Burly, MD   Chief Complaint: pain, swelling in right leg and foot.  HPI: Stephanie Combs is a 63 y.o. female  With a history of COPD, hypertension, diabetes mellitus with peripheral neuropathy and right toe diabetic foot ulcer which is chronic in nature, diastolic heart failure, hypothyroidism. The patient was admitted back in June 2016 for her diabetic foot ulcer with an MRI that showed osteomyelitis versus reactive infection. The patient has been struggling with her foot infection since that time, but appears to still have a chronic osteomyelitis. 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 Prudy Feeler. Several different antibiotics had been used. Over the past couple of days, the patient noted increased swelling and redness in her lower leg. She's also had pain particularly in the back side of her leg has been worsening over the same time. Ambulation increase pain. Rest improves the pain..   Review of Systems:   Pt denies any fevers, chills, nausea, vomiting, diarrhea, constipation, abdominal pain, shortness of breath, dyspnea on exertion, orthopnea, cough, wheezing, palpitations, headache, vision changes, lightheadedness, dizziness, diarrhea, constipation, melena, rectal bleeding.  Review of systems are otherwise negative  Past Medical History  Diagnosis Date  . Hypertension   . Diabetes mellitus without complication ([place])   . COPD (chronic obstructive pulmonary disease) ([place])   . CHF (congestive heart failure) ([facility])     Diastolic  . Anxiety   . Chronic back pain   . Hypothyroidism   . Pedal edema   . On home O2     2.5 L N/C prn  . Degenerative disk disease    Past Surgical History  Procedure Laterality Date  . Tubal ligation    . Hernia repair     Social  History:  reports that she has been smoking Cigarettes.  She has been smoking about 0.25 packs per day. She has never used smokeless tobacco. She reports that she does not drink alcohol or use illicit drugs. Patient lives at home& is able to participate in activities of daily living   Allergies  Allergen Reactions  . Dilaudid [Hydromorphone Hcl] Itching  . Actifed Cold-Allergy [Chlorpheniramine-Phenylephrine]     "I was sick and red and it didn't agree with me at all"  . Doxycycline Nausea And Vomiting  . Other Cough    Pt states she is allergic to ragweed and that she starts coughing and sneezing like crazy  . Penicillins Hives and Itching    Has patient had a PCN reaction causing immediate rash, facial/tongue/throat swelling, SOB or lightheadedness with hypotension: Yes Has patient had a PCN reaction causing severe rash involving mucus membranes or skin necrosis: No Has patient had a PCN reaction that required hospitalization Yes Has patient had a PCN reaction occurring within the last 10 years: No If all of the above answers are "NO", then may proceed with Cephalosporin use.   . Reglan [Metoclopramide] Itching  . Valium [Diazepam] Itching  . Vistaril [Hydroxyzine Hcl] Itching    Family History  Problem Relation Age of Onset  . Stroke Mother   . Heart attack Father   . Diabetes Brother   . Cerebral palsy Brother   . Pneumonia Brother   . Diabetes Other   . Heart attack Other      Prior to  Admission medications   Medication Sig Start Date End Date Taking? Authorizing Provider  albuterol (PROAIR HFA) 108 (90 BASE) MCG/ACT inhaler Inhale 1 puff into the lungs every 4 (four) hours as needed. For shortness of breath Patient taking differently: Inhale 2 puffs into the lungs every 4 (four) hours as needed. For shortness of breath 05/30/13  Yes Kinnie Feil, MD  alprazolam Duanne Moron) 2 MG tablet Take 1 tablet (2 mg total) by mouth 3 (three) times daily as needed for anxiety. 11/10/14   Yes Radene Gunning, NP  aspirin EC 81 MG tablet Take 81 mg by mouth daily.   Yes Historical Provider, MD  benzonatate (TESSALON) 100 MG capsule Take 1 capsule (100 mg total) by mouth 3 (three) times daily as needed for cough. Patient taking differently: Take 200 mg by mouth 2 (two) times daily as needed for cough.  12/31/13  Yes Jola Schmidt, MD  Bisacodyl (FLEET LAXATIVE RE) Place 1 application rectally daily as needed (constipation).   Yes Historical Provider, MD  diltiazem (CARDIZEM CD) 240 MG 24 hr capsule Take 1 capsule (240 mg total) by mouth daily. 05/30/13  Yes Kinnie Feil, MD  empagliflozin (JARDIANCE) 25 MG TABS tablet Take 25 mg by mouth daily.   Yes Historical Provider, MD  furosemide (LASIX) 20 MG tablet Take 2 tablets (40 mg total) by mouth 2 (two) times daily. 12/06/14  Yes Estela Leonie Green, MD  gabapentin (NEURONTIN) 600 MG tablet Take 1 tablet (600 mg total) by mouth 4 (four) times daily. Patient taking differently: Take 1,200 mg by mouth 2 (two) times daily.  05/30/13  Yes Kinnie Feil, MD  insulin aspart (NOVOLOG) 100 UNIT/ML injection Inject 0-20 Units into the skin 3 (three) times daily with meals. Takes 20 Units in am and 20 Units at night.  Per sliding scale 05/30/13  Yes Kinnie Feil, MD  insulin detemir (LEVEMIR) 100 UNIT/ML injection Inject 0.2 mLs (20 Units total) into the skin at bedtime. Patient taking differently: Inject 40 Units into the skin at bedtime.  12/06/14  Yes Erline Hau, MD  ipratropium-albuterol (DUONEB) 0.5-2.5 (3) MG/3ML SOLN Take 3 mLs by nebulization every 6 (six) hours. Patient taking differently: Take 3 mLs by nebulization 3 (three) times daily. *may also use as needed* 05/30/13  Yes Kinnie Feil, MD  levothyroxine (SYNTHROID, LEVOTHROID) 125 MCG tablet Take 2 tablets (250 mcg total) by mouth daily before breakfast. 05/30/13  Yes Kinnie Feil, MD  metFORMIN (GLUCOPHAGE) 500 MG tablet Take 500 mg by mouth 2 (two) times  daily with a meal.   Yes Historical Provider, MD  naproxen sodium (ALEVE) 220 MG tablet Take 220 mg by mouth daily as needed (for pain).   Yes Historical Provider, MD  nicotine (NICODERM CQ - DOSED IN MG/24 HOURS) 21 mg/24hr patch Place 1 patch (21 mg total) onto the skin daily. 05/30/13  Yes Kinnie Feil, MD  nystatin (MYCOSTATIN/NYSTOP) 100000 UNIT/GM POWD Apply 1 g topically 2 (two) times daily.   Yes Historical Provider, MD  Omega-3 Fatty Acids (FISH OIL PO) Take 1 capsule by mouth 2 (two) times daily.    Yes Historical Provider, MD  ondansetron (ZOFRAN ODT) 8 MG disintegrating tablet Take 1 tablet (8 mg total) by mouth every 8 (eight) hours as needed for nausea or vomiting. 12/31/13  Yes Jola Schmidt, MD  pantoprazole (PROTONIX) 40 MG tablet Take 1 tablet (40 mg total) by mouth 2 (two) times daily. 11/10/14  Yes  Radene Gunning, NP  potassium chloride (K-DUR) 10 MEQ tablet Take 20 mEq by mouth 2 (two) times daily.    Yes Historical Provider, MD  tiZANidine (ZANAFLEX) 4 MG tablet Take 4 mg by mouth 4 (four) times daily.   Yes Historical Provider, MD    Physical Exam: BP 124/94 mmHg  Pulse 93  Temp(Src) 98 F (36.7 C) (Oral)  Resp 20  Ht '5\' 6"'$  (1.676 m)  Wt 118.253 kg (260 lb 11.2 oz)  BMI 42.10 kg/m2  SpO2 96%  General:  elderly Caucasian female. Awake and alert and oriented x3. No acute cardiopulmonary distress.  Eyes: Pupils equal, round, reactive to light. Extraocular muscles are intact. Sclerae anicteric and noninjected.  ENT:  Moist mucosal membranes. No mucosal lesions.   Neck: Neck supple without lymphadenopathy. No carotid bruits. No masses palpated.  Cardiovascular: Regular rate with normal S1-S2 sounds. No murmurs, rubs, gallops auscultated. No JVD.  Respiratory: Good respiratory effort with no wheezes, rales, rhonchi. Lungs clear to auscultation bilaterally.  Abdomen: Soft, nontender, nondistended. Active bowel sounds. No masses or hepatosplenomegaly  Skin: Dry, warm to  touch. 2+ radial pulses. feet cool to touch. There is mild erythema in the medial aspect of the right lower leg that wraps around to the posterior aspect. This extends to the ankle and a small amount of the hindfoot but does not extend to the forefoot. There is no erythema around the diabetic foot ulcer. There is significant tenderness to the calves of her right leg. Musculoskeletal: No calf or leg pain. All major joints not erythematous nontender.  Psychiatric: Intact judgment and insight.  Neurologic: No focal neurological deficits. Cranial nerves II through XII are grossly intact.           Labs on Admission:  Basic Metabolic Panel:  Recent Labs Lab 03/24/15 1538  NA 139  K 4.6  CL 105  CO2 26  GLUCOSE 94  BUN 21*  CREATININE 1.15*  CALCIUM 8.7*   Liver Function Tests:  Recent Labs Lab 03/24/15 1538  AST 12*  ALT 9*  ALKPHOS 75  BILITOT 0.3  PROT 6.3*  ALBUMIN 3.1*   No results for input(s): LIPASE, AMYLASE in the last 168 hours. No results for input(s): AMMONIA in the last 168 hours. CBC:  Recent Labs Lab 03/24/15 1538  WBC 9.2  NEUTROABS 5.4  HGB 10.6*  HCT 34.3*  MCV 84.9  PLT 224   Cardiac Enzymes:  Recent Labs Lab 03/24/15 1538  TROPONINI <0.03    BNP (last 3 results)  Recent Labs  11/24/14 0933 12/04/14 0456 03/24/15 1538  BNP 50.0 50.0 170.0*    ProBNP (last 3 results) No results for input(s): PROBNP in the last 8760 hours.  CBG: No results for input(s): GLUCAP in the last 168 hours.  Radiological Exams on Admission: No results found.   Assessment/Plan Present on Admission:  . Cellulitis of foot, right . Elevated d-dimer . Diabetic ulcer of right great toe [facility])  This patient was discussed with the ED physician, including pertinent vitals, physical exam findings, labs, and imaging.  We also discussed care given by the ED provider.   #1 cellulitis of the right leg  Admit  Clindamycin and levofloxacin for antibiotic  coverage to cover diabetic pathogens   Wound care consult  Diabetic consult  Ankle-brachial index  Recheck CBC in the morning  #2 elevated d-dimer  We'll need right lower extremity duplex  Cover with therapeutic dose of Lovenox  #3 diabetic ulcer of  the right great toe   chronic osteomyelitis   consult wound care  #4 diabetes   continue home insulin with sliding scale insulin    CBGs before meals and daily at bedtime  DVT prophylaxis: treatment dose of Lovenox   Consultants: as above   Code Status: DO NOT RESUSCITATE  Family Communication: none    Disposition Plan: admit    Truett Mainland, DO [facility] Pager [phone]

## 2015-03-24 NOTE — ED Notes (Signed)
Lab at bedside

## 2015-03-24 NOTE — ED Notes (Signed)
RT at bedside.

## 2015-03-25 ENCOUNTER — Inpatient Hospital Stay (HOSPITAL_COMMUNITY): Payer: Medicare Other

## 2015-03-25 LAB — BASIC METABOLIC PANEL
Anion gap: 7 (ref 5–15)
BUN: 26 mg/dL — ABNORMAL HIGH (ref 6–20)
CO2: 25 mmol/L (ref 22–32)
Calcium: 8.3 mg/dL — ABNORMAL LOW (ref 8.9–10.3)
Chloride: 100 mmol/L — ABNORMAL LOW (ref 101–111)
Creatinine, Ser: 1.11 mg/dL — ABNORMAL HIGH (ref 0.44–1.00)
GFR calc Af Amer: 59 mL/min — ABNORMAL LOW (ref >60)
GFR calc non Af Amer: 51 mL/min — ABNORMAL LOW (ref >60)
Glucose, Bld: 388 mg/dL — ABNORMAL HIGH (ref 65–99)
Potassium: 4.5 mmol/L (ref 3.5–5.1)
Sodium: 132 mmol/L — ABNORMAL LOW (ref 135–145)

## 2015-03-25 LAB — GLUCOSE, CAPILLARY
Glucose-Capillary: 359 mg/dL — ABNORMAL HIGH (ref 65–99)
Glucose-Capillary: 356 mg/dL — ABNORMAL HIGH (ref 65–99)

## 2015-03-25 LAB — CBC
HCT: 33.7 % — ABNORMAL LOW (ref 36.0–46.0)
Hemoglobin: 10.5 g/dL — ABNORMAL LOW (ref 12.0–15.0)
MCH: 26.3 pg (ref 26.0–34.0)
MCHC: 31.2 g/dL (ref 30.0–36.0)
MCV: 84.3 fL (ref 78.0–100.0)
Platelets: 252 10*3/uL (ref 150–400)
RBC: 4 MIL/uL (ref 3.87–5.11)
RDW: 17 % — ABNORMAL HIGH (ref 11.5–15.5)
WBC: 6 10*3/uL (ref 4.0–10.5)

## 2015-03-25 MED ORDER — MENTHOL 3 MG MT LOZG
1.0000 | LOZENGE | OROMUCOSAL | Status: DC | PRN
  Filled 2015-03-25 (×2): qty 9

## 2015-03-25 MED ORDER — CLINDAMYCIN HCL 300 MG PO CAPS: 300.0000 mg | ORAL_CAPSULE | Freq: Three times a day (TID) | ORAL | Status: DC

## 2015-03-25 MED ORDER — LEVOFLOXACIN 500 MG PO TABS: 500.0000 mg | ORAL_TABLET | Freq: Every day | ORAL | Status: DC

## 2015-03-25 MED ORDER — SACCHAROMYCES BOULARDII 250 MG PO CAPS: 250.0000 mg | ORAL_CAPSULE | Freq: Two times a day (BID) | ORAL | Status: DC

## 2015-03-25 MED ORDER — MENTHOL 3 MG MT LOZG: 1.0000 | LOZENGE | OROMUCOSAL | Status: DC | PRN

## 2015-03-25 MED ORDER — GABAPENTIN 400 MG PO CAPS
1200.0000 mg | ORAL_CAPSULE | Freq: Two times a day (BID) | ORAL | Status: DC
  Administered 2015-03-25: 1200 mg via ORAL
  Filled 2015-03-25: qty 3

## 2015-03-25 NOTE — Discharge Summary (Signed)
Physician Discharge Summary  Stephanie Combs OXB:[id number] DOB: 1951-01-11 DOA: 03/24/2015  PCP: Neale Burly, MD  Admit date: 03/24/2015 Discharge date: 03/25/2015  Time spent: 20 minutes  Recommendations for Outpatient Follow-up:  1. Follow up with PCP in 2-3 weeks 2.  Follow up with foot specialist as already scheduled of 11/14   Discharge Diagnoses:  Active Problems:   Diabetic ulcer of right great toe ([place])   Cellulitis of foot, right   Elevated d-dimer   Discharge Condition: Stable  Diet recommendation: Diabetic  Filed Weights   03/24/15 1508 03/24/15 1832  Weight: 107.049 kg (236 lb) 118.253 kg (260 lb 11.2 oz)    History of present illness:  Please review dictated H and P from 11/12 for details. Briefly, 64 y.o. female with a history of COPD, hypertension, diabetes mellitus with peripheral neuropathy and right toe diabetic foot ulcer which is chronic in nature, diastolic heart failure, hypothyroidism who presents with increased RLE pain and swelling. Patient was admitted for tx of cellulitis.  Hospital Course:  #1 cellulitis of the right leg  Patient was started on empiric Clindamycin and levofloxacin for antibiotic coverage to cover diabetic pathogens, and will continue to complete 7 days of treatment  Lower extremity dopplers were unrevealing  On further questioning, pt reports already having a follow up with foot specialist at [facility] scheduled for around 2pm on 11/14. Pt was advised to follow up at that apponitment  Patient remained afebrile without leukocytosis #2 elevated d-dimer  Lower extremity duplex US was negative for DVT #3 diabetic ulcer of the right great toe  Known chronic osteomyelitis  Pt reports being able to do wound care at home. No purulence on exam and no surrounding erythema  Pt already scheduled to follow up with specialist for foot in [facility] scheduled for around 2pm on 11/14 #4 diabetes  Continued home insulin  with sliding scale insulin   Procedures:  LE dopplers  Discharge Exam: Filed Vitals:   03/24/15 1832 03/24/15 2035 03/24/15 2300 03/25/15 0644  BP: 124/94 123/45  119/42  Pulse: 93 86 80 75  Temp: 98 F (36.7 C) 99.4 F (37.4 C)  97.6 F (36.4 C)  TempSrc: Oral Oral  Oral  Resp: '20 20 20 20  '$ Height: '5\' 6"'$  (1.676 m)     Weight: 118.253 kg (260 lb 11.2 oz)     SpO2: 96% 97% 98% 98%    General: Awake, in nad Cardiovascular: regular, s1, s2 Respiratory: normal resp effort, no wheezing  Discharge Instructions     Medication List    TAKE these medications        albuterol 108 (90 BASE) MCG/ACT inhaler  Commonly known as:  PROAIR HFA  Inhale 1 puff into the lungs every 4 (four) hours as needed. For shortness of breath     ALEVE 220 MG tablet  Generic drug:  naproxen sodium  Take 220 mg by mouth daily as needed (for pain).     alprazolam 2 MG tablet  Commonly known as:  XANAX  Take 1 tablet (2 mg total) by mouth 3 (three) times daily as needed for anxiety.     aspirin EC 81 MG tablet  Take 81 mg by mouth daily.     benzonatate 100 MG capsule  Commonly known as:  TESSALON  Take 1 capsule (100 mg total) by mouth 3 (three) times daily as needed for cough.     clindamycin 300 MG capsule  Commonly known as:  CLEOCIN  Take 1 capsule (300 mg total) by mouth 3 (three) times daily.     diltiazem 240 MG 24 hr capsule  Commonly known as:  CARDIZEM CD  Take 1 capsule (240 mg total) by mouth daily.     FISH OIL PO  Take 1 capsule by mouth 2 (two) times daily.     FLEET LAXATIVE RE  Place 1 application rectally daily as needed (constipation).     furosemide 20 MG tablet  Commonly known as:  LASIX  Take 2 tablets (40 mg total) by mouth 2 (two) times daily.     gabapentin 600 MG tablet  Commonly known as:  NEURONTIN  Take 1 tablet (600 mg total) by mouth 4 (four) times daily.     insulin aspart 100 UNIT/ML injection  Commonly known as:  novoLOG  Inject 0-20 Units  into the skin 3 (three) times daily with meals. Takes 20 Units in am and 20 Units at night.  Per sliding scale     insulin detemir 100 UNIT/ML injection  Commonly known as:  LEVEMIR  Inject 0.2 mLs (20 Units total) into the skin at bedtime.     ipratropium-albuterol 0.5-2.5 (3) MG/3ML Soln  Commonly known as:  DUONEB  Take 3 mLs by nebulization every 6 (six) hours.     JARDIANCE 25 MG Tabs tablet  Generic drug:  empagliflozin  Take 25 mg by mouth daily.     levofloxacin 500 MG tablet  Commonly known as:  LEVAQUIN  Take 1 tablet (500 mg total) by mouth daily.     levothyroxine 125 MCG tablet  Commonly known as:  SYNTHROID, LEVOTHROID  Take 2 tablets (250 mcg total) by mouth daily before breakfast.     menthol-cetylpyridinium 3 MG lozenge  Commonly known as:  CEPACOL  Take 1 lozenge (3 mg total) by mouth as needed for sore throat.     metFORMIN 500 MG tablet  Commonly known as:  GLUCOPHAGE  Take 500 mg by mouth 2 (two) times daily with a meal.     nicotine 21 mg/24hr patch  Commonly known as:  NICODERM CQ - dosed in mg/24 hours  Place 1 patch (21 mg total) onto the skin daily.     nystatin 100000 UNIT/GM Powd  Apply 1 g topically 2 (two) times daily.     ondansetron 8 MG disintegrating tablet  Commonly known as:  ZOFRAN ODT  Take 1 tablet (8 mg total) by mouth every 8 (eight) hours as needed for nausea or vomiting.     pantoprazole 40 MG tablet  Commonly known as:  PROTONIX  Take 1 tablet (40 mg total) by mouth 2 (two) times daily.     potassium chloride 10 MEQ tablet  Commonly known as:  K-DUR  Take 20 mEq by mouth 2 (two) times daily.     saccharomyces boulardii 250 MG capsule  Commonly known as:  FLORASTOR  Take 1 capsule (250 mg total) by mouth 2 (two) times daily.     tiZANidine 4 MG tablet  Commonly known as:  ZANAFLEX  Take 4 mg by mouth 4 (four) times daily.       Allergies  Allergen Reactions  . Dilaudid [Hydromorphone Hcl] Itching  . Actifed  Cold-Allergy [Chlorpheniramine-Phenylephrine]     "I was sick and red and it didn't agree with me at all"  . Doxycycline Nausea And Vomiting  . Other Cough    Pt states she is allergic to ragweed and that she starts coughing and  sneezing like crazy  . Penicillins Hives and Itching    Has patient had a PCN reaction causing immediate rash, facial/tongue/throat swelling, SOB or lightheadedness with hypotension: Yes Has patient had a PCN reaction causing severe rash involving mucus membranes or skin necrosis: No Has patient had a PCN reaction that required hospitalization Yes Has patient had a PCN reaction occurring within the last 10 years: No If all of the above answers are "NO", then may proceed with Cephalosporin use.   . Reglan [Metoclopramide] Itching  . Valium [Diazepam] Itching  . Vistaril [Hydroxyzine Hcl] Itching   Follow-up Information    Follow up with Neale Burly, MD. Schedule an appointment as soon as possible for a visit in 2 weeks.   Specialty:  Internal Medicine   Why:  Hospital follow up   Contact information:   [place] [id number] [phone]       Follow up with Follow up with your scheduled appointment with your foot specialist on 11/14.       The results of significant diagnostics from this hospitalization (including imaging, microbiology, ancillary and laboratory) are listed below for reference.    Significant Diagnostic Studies: US Venous Img Lower Unilateral Right  03/25/2015  CLINICAL DATA:  64 year old female with right lower extremity pain and swelling for 5 days. EXAM: RIGHT LOWER EXTREMITY VENOUS DOPPLER ULTRASOUND TECHNIQUE: Gray-scale sonography with graded compression, as well as color Doppler and duplex ultrasound were performed to evaluate the lower extremity deep venous systems from the level of the common femoral vein and including the common femoral, femoral, profunda femoral, popliteal and calf veins including the posterior  tibial, peroneal and gastrocnemius veins when visible. The superficial great saphenous vein was also interrogated. Spectral Doppler was utilized to evaluate flow at rest and with distal augmentation maneuvers in the common femoral, femoral and popliteal veins. COMPARISON:  None. FINDINGS: The right deep venous system appears patent and compressible from groin through popliteal fossae. Spontaneous venous flow present with evidence of respiratory phasicity. Augmentation intact. No intraluminal thrombus identified. Visualized portions of the greater saphenous veins patent. The left common femoral vein is patent without DVT. IMPRESSION: No evidence of right lower extremity deep venous thrombosis. Electronically Signed   By: Margarette Canada M.D.   On: 03/25/2015 11:29    Microbiology: No results found for this or any previous visit (from the past 240 hour(s)).   Labs: Basic Metabolic Panel:  Recent Labs Lab 03/24/15 1538 03/25/15 0633  NA 139 132*  K 4.6 4.5  CL 105 100*  CO2 26 25  GLUCOSE 94 388*  BUN 21* 26*  CREATININE 1.15* 1.11*  CALCIUM 8.7* 8.3*   Liver Function Tests:  Recent Labs Lab 03/24/15 1538  AST 12*  ALT 9*  ALKPHOS 75  BILITOT 0.3  PROT 6.3*  ALBUMIN 3.1*   No results for input(s): LIPASE, AMYLASE in the last 168 hours. No results for input(s): AMMONIA in the last 168 hours. CBC:  Recent Labs Lab 03/24/15 1538 03/25/15 0633  WBC 9.2 6.0  NEUTROABS 5.4  --   HGB 10.6* 10.5*  HCT 34.3* 33.7*  MCV 84.9 84.3  PLT 224 252   Cardiac Enzymes:  Recent Labs Lab 03/24/15 1538  TROPONINI <0.03   BNP: BNP (last 3 results)  Recent Labs  11/24/14 0933 12/04/14 0456 03/24/15 1538  BNP 50.0 50.0 170.0*    ProBNP (last 3 results) No results for input(s): PROBNP in the last 8760 hours.  CBG:  Recent Labs Lab 03/24/15 2035 03/25/15 0731 03/25/15 1116  GLUCAP 317* 359* 356*    Signed:  Makiyah Zentz K  [facility] 03/25/2015, 2:30 PM

## 2015-03-25 NOTE — Progress Notes (Signed)
Discharge instructions given to patient with all questions answered. Prescriptions given and told to get filled today. Patient is waiting for ride.

## 2015-04-30 ENCOUNTER — Encounter (HOSPITAL_COMMUNITY): Payer: Self-pay | Admitting: *Deleted

## 2015-04-30 ENCOUNTER — Emergency Department (HOSPITAL_COMMUNITY): Payer: Medicare Other

## 2015-04-30 ENCOUNTER — Observation Stay (HOSPITAL_COMMUNITY)
Admission: EM | Admit: 2015-04-30 | Discharge: 2015-05-02 | Disposition: A | Discharge status: Home / Self Care | Payer: Medicare Other | Attending: Internal Medicine | Admitting: Internal Medicine

## 2015-04-30 ENCOUNTER — Observation Stay (HOSPITAL_COMMUNITY): Payer: Medicare Other

## 2015-04-30 DIAGNOSIS — Z7984 Long term (current) use of oral hypoglycemic drugs: Secondary | ICD-10-CM | POA: Insufficient documentation

## 2015-04-30 DIAGNOSIS — R609 Edema, unspecified: Secondary | ICD-10-CM

## 2015-04-30 DIAGNOSIS — N179 Acute kidney failure, unspecified: Secondary | ICD-10-CM | POA: Diagnosis not present

## 2015-04-30 DIAGNOSIS — Z7982 Long term (current) use of aspirin: Secondary | ICD-10-CM | POA: Diagnosis not present

## 2015-04-30 DIAGNOSIS — E039 Hypothyroidism, unspecified: Secondary | ICD-10-CM | POA: Insufficient documentation

## 2015-04-30 DIAGNOSIS — IMO0002 Reserved for concepts with insufficient information to code with codable children: Secondary | ICD-10-CM

## 2015-04-30 DIAGNOSIS — R079 Chest pain, unspecified: Secondary | ICD-10-CM | POA: Diagnosis not present

## 2015-04-30 DIAGNOSIS — J449 Chronic obstructive pulmonary disease, unspecified: Secondary | ICD-10-CM | POA: Insufficient documentation

## 2015-04-30 DIAGNOSIS — N183 Chronic kidney disease, stage 3 unspecified: Secondary | ICD-10-CM

## 2015-04-30 DIAGNOSIS — Z9981 Dependence on supplemental oxygen: Secondary | ICD-10-CM | POA: Insufficient documentation

## 2015-04-30 DIAGNOSIS — E1165 Type 2 diabetes mellitus with hyperglycemia: Secondary | ICD-10-CM

## 2015-04-30 DIAGNOSIS — E1122 Type 2 diabetes mellitus with diabetic chronic kidney disease: Secondary | ICD-10-CM

## 2015-04-30 DIAGNOSIS — I10 Essential (primary) hypertension: Secondary | ICD-10-CM | POA: Diagnosis not present

## 2015-04-30 DIAGNOSIS — F419 Anxiety disorder, unspecified: Secondary | ICD-10-CM | POA: Insufficient documentation

## 2015-04-30 DIAGNOSIS — N181 Chronic kidney disease, stage 1: Secondary | ICD-10-CM

## 2015-04-30 DIAGNOSIS — F17210 Nicotine dependence, cigarettes, uncomplicated: Secondary | ICD-10-CM | POA: Diagnosis not present

## 2015-04-30 DIAGNOSIS — I5032 Chronic diastolic (congestive) heart failure: Secondary | ICD-10-CM | POA: Diagnosis not present

## 2015-04-30 DIAGNOSIS — J441 Chronic obstructive pulmonary disease with (acute) exacerbation: Secondary | ICD-10-CM | POA: Diagnosis not present

## 2015-04-30 DIAGNOSIS — E119 Type 2 diabetes mellitus without complications: Secondary | ICD-10-CM | POA: Insufficient documentation

## 2015-04-30 DIAGNOSIS — I509 Heart failure, unspecified: Secondary | ICD-10-CM | POA: Diagnosis not present

## 2015-04-30 DIAGNOSIS — R0602 Shortness of breath: Secondary | ICD-10-CM

## 2015-04-30 DIAGNOSIS — E871 Hypo-osmolality and hyponatremia: Secondary | ICD-10-CM | POA: Diagnosis not present

## 2015-04-30 DIAGNOSIS — Z79899 Other long term (current) drug therapy: Secondary | ICD-10-CM | POA: Diagnosis not present

## 2015-04-30 DIAGNOSIS — Z794 Long term (current) use of insulin: Secondary | ICD-10-CM | POA: Insufficient documentation

## 2015-04-30 DIAGNOSIS — N189 Chronic kidney disease, unspecified: Secondary | ICD-10-CM

## 2015-04-30 DIAGNOSIS — L8990 Pressure ulcer of unspecified site, unspecified stage: Secondary | ICD-10-CM | POA: Insufficient documentation

## 2015-04-30 DIAGNOSIS — M7989 Other specified soft tissue disorders: Secondary | ICD-10-CM

## 2015-04-30 LAB — CBC WITH DIFFERENTIAL/PLATELET
Basophils Absolute: 0.1 10*3/uL (ref 0.0–0.1)
Basophils Relative: 1 %
Eosinophils Absolute: 0.3 10*3/uL (ref 0.0–0.7)
Eosinophils Relative: 3 %
HCT: 40.6 % (ref 36.0–46.0)
Hemoglobin: 13 g/dL (ref 12.0–15.0)
Lymphocytes Relative: 42 %
Lymphs Abs: 3.5 10*3/uL (ref 0.7–4.0)
MCH: 26.5 pg (ref 26.0–34.0)
MCHC: 32 g/dL (ref 30.0–36.0)
MCV: 82.7 fL (ref 78.0–100.0)
Monocytes Absolute: 0.5 10*3/uL (ref 0.1–1.0)
Monocytes Relative: 6 %
Neutro Abs: 4 10*3/uL (ref 1.7–7.7)
Neutrophils Relative %: 48 %
Platelets: 291 10*3/uL (ref 150–400)
RBC: 4.91 MIL/uL (ref 3.87–5.11)
RDW: 16.5 % — ABNORMAL HIGH (ref 11.5–15.5)
WBC: 8.3 10*3/uL (ref 4.0–10.5)

## 2015-04-30 LAB — GLUCOSE, CAPILLARY
Glucose-Capillary: 235 mg/dL — ABNORMAL HIGH (ref 65–99)
Glucose-Capillary: 466 mg/dL — ABNORMAL HIGH (ref 65–99)
Glucose-Capillary: 520 mg/dL — ABNORMAL HIGH (ref 65–99)
Glucose-Capillary: 526 mg/dL — ABNORMAL HIGH (ref 65–99)
Glucose-Capillary: 499 mg/dL — ABNORMAL HIGH (ref 65–99)

## 2015-04-30 LAB — URINE MICROSCOPIC-ADD ON: WBC, UA: NONE SEEN WBC/hpf (ref 0–5)

## 2015-04-30 LAB — URINALYSIS, ROUTINE W REFLEX MICROSCOPIC
Bilirubin Urine: NEGATIVE
Glucose, UA: >1000 mg/dL — AB
Ketones, ur: NEGATIVE mg/dL
Leukocytes, UA: NEGATIVE
Nitrite: NEGATIVE
Protein, ur: NEGATIVE mg/dL
Specific Gravity, Urine: 1.01 (ref 1.005–1.030)
pH: 5.5 (ref 5.0–8.0)

## 2015-04-30 LAB — BASIC METABOLIC PANEL
Anion gap: 7 (ref 5–15)
Anion gap: 11 (ref 5–15)
BUN: 21 mg/dL — ABNORMAL HIGH (ref 6–20)
BUN: 26 mg/dL — ABNORMAL HIGH (ref 6–20)
CO2: 25 mmol/L (ref 22–32)
CO2: 21 mmol/L — ABNORMAL LOW (ref 22–32)
Calcium: 8.9 mg/dL (ref 8.9–10.3)
Calcium: 8.3 mg/dL — ABNORMAL LOW (ref 8.9–10.3)
Chloride: 107 mmol/L (ref 101–111)
Chloride: 96 mmol/L — ABNORMAL LOW (ref 101–111)
Creatinine, Ser: 1.24 mg/dL — ABNORMAL HIGH (ref 0.44–1.00)
Creatinine, Ser: 1.63 mg/dL — ABNORMAL HIGH (ref 0.44–1.00)
GFR calc Af Amer: 52 mL/min — ABNORMAL LOW (ref >60)
GFR calc Af Amer: 37 mL/min — ABNORMAL LOW (ref >60)
GFR calc non Af Amer: 45 mL/min — ABNORMAL LOW (ref >60)
GFR calc non Af Amer: 32 mL/min — ABNORMAL LOW (ref >60)
Glucose, Bld: 167 mg/dL — ABNORMAL HIGH (ref 65–99)
Glucose, Bld: 492 mg/dL — ABNORMAL HIGH (ref 65–99)
Potassium: 4.5 mmol/L (ref 3.5–5.1)
Potassium: 5 mmol/L (ref 3.5–5.1)
Sodium: 139 mmol/L (ref 135–145)
Sodium: 128 mmol/L — ABNORMAL LOW (ref 135–145)

## 2015-04-30 LAB — TROPONIN I
Troponin I: <0.03 ng/mL (ref <0.031)
Troponin I: <0.03 ng/mL (ref <0.031)
Troponin I: <0.03 ng/mL (ref <0.031)
Troponin I: <0.03 ng/mL (ref <0.031)

## 2015-04-30 LAB — INFLUENZA PANEL BY PCR (TYPE A & B)
H1N1 flu by pcr: NOT DETECTED
Influenza A By PCR: NEGATIVE
Influenza B By PCR: NEGATIVE

## 2015-04-30 LAB — BRAIN NATRIURETIC PEPTIDE: B Natriuretic Peptide: 37 pg/mL (ref 0.0–100.0)

## 2015-04-30 MED ORDER — INSULIN ASPART 100 UNIT/ML ~~LOC~~ SOLN
0.0000 [IU] | Freq: Three times a day (TID) | SUBCUTANEOUS | Status: DC
  Administered 2015-04-30: 3 [IU] via SUBCUTANEOUS
  Administered 2015-04-30 (×2): 9 [IU] via SUBCUTANEOUS
  Administered 2015-05-01 (×2): 5 [IU] via SUBCUTANEOUS
  Administered 2015-05-01: 3 [IU] via SUBCUTANEOUS
  Administered 2015-05-02 (×2): 2 [IU] via SUBCUTANEOUS

## 2015-04-30 MED ORDER — ENOXAPARIN SODIUM 40 MG/0.4ML ~~LOC~~ SOLN
40.0000 mg | SUBCUTANEOUS | Status: DC
  Administered 2015-04-30 – 2015-05-02 (×3): 40 mg via SUBCUTANEOUS
  Filled 2015-04-30 (×4): qty 0.4

## 2015-04-30 MED ORDER — FUROSEMIDE 10 MG/ML IJ SOLN
20.0000 mg | Freq: Once | INTRAMUSCULAR | Status: AC
  Administered 2015-04-30: 20 mg via INTRAVENOUS
  Filled 2015-04-30: qty 2

## 2015-04-30 MED ORDER — LEVOFLOXACIN IN D5W 500 MG/100ML IV SOLN
500.0000 mg | INTRAVENOUS | Status: DC
  Administered 2015-05-01 – 2015-05-02 (×2): 500 mg via INTRAVENOUS
  Filled 2015-04-30 (×2): qty 100

## 2015-04-30 MED ORDER — METHYLPREDNISOLONE SODIUM SUCC 125 MG IJ SOLR: 60.0000 mg | Freq: Four times a day (QID) | INTRAMUSCULAR | Status: DC

## 2015-04-30 MED ORDER — METHYLPREDNISOLONE SODIUM SUCC 125 MG IJ SOLR
60.0000 mg | Freq: Four times a day (QID) | INTRAMUSCULAR | Status: DC
  Administered 2015-04-30: 60 mg via INTRAVENOUS
  Filled 2015-04-30: qty 2

## 2015-04-30 MED ORDER — INSULIN ASPART 100 UNIT/ML ~~LOC~~ SOLN
10.0000 [IU] | Freq: Once | SUBCUTANEOUS | Status: AC
  Administered 2015-04-30: 10 [IU] via SUBCUTANEOUS

## 2015-04-30 MED ORDER — OXYCODONE-ACETAMINOPHEN 5-325 MG PO TABS
2.0000 | ORAL_TABLET | Freq: Four times a day (QID) | ORAL | Status: DC | PRN
  Administered 2015-04-30 – 2015-05-02 (×5): 2 via ORAL
  Filled 2015-04-30 (×5): qty 2

## 2015-04-30 MED ORDER — INSULIN ASPART 100 UNIT/ML ~~LOC~~ SOLN
6.0000 [IU] | Freq: Three times a day (TID) | SUBCUTANEOUS | Status: DC
  Administered 2015-04-30: 6 [IU] via SUBCUTANEOUS

## 2015-04-30 MED ORDER — GABAPENTIN 400 MG PO CAPS
1200.0000 mg | ORAL_CAPSULE | Freq: Two times a day (BID) | ORAL | Status: DC
  Administered 2015-04-30 – 2015-05-02 (×5): 1200 mg via ORAL
  Filled 2015-04-30 (×5): qty 3

## 2015-04-30 MED ORDER — LOPERAMIDE HCL 2 MG PO CAPS
2.0000 mg | ORAL_CAPSULE | Freq: Once | ORAL | Status: AC
  Administered 2015-04-30: 2 mg via ORAL
  Filled 2015-04-30: qty 1

## 2015-04-30 MED ORDER — INSULIN DETEMIR 100 UNIT/ML ~~LOC~~ SOLN
40.0000 [IU] | Freq: Every day | SUBCUTANEOUS | Status: DC
  Filled 2015-04-30: qty 0.4

## 2015-04-30 MED ORDER — IPRATROPIUM-ALBUTEROL 0.5-2.5 (3) MG/3ML IN SOLN
3.0000 mL | Freq: Four times a day (QID) | RESPIRATORY_TRACT | Status: DC
  Administered 2015-04-30 – 2015-05-02 (×7): 3 mL via RESPIRATORY_TRACT
  Filled 2015-04-30 (×7): qty 3

## 2015-04-30 MED ORDER — DILTIAZEM HCL ER COATED BEADS 240 MG PO CP24
240.0000 mg | ORAL_CAPSULE | Freq: Every day | ORAL | Status: DC
  Administered 2015-04-30 – 2015-05-02 (×3): 240 mg via ORAL
  Filled 2015-04-30 (×4): qty 1

## 2015-04-30 MED ORDER — NITROGLYCERIN 0.4 MG SL SUBL: 0.4000 mg | SUBLINGUAL_TABLET | SUBLINGUAL | Status: DC | PRN

## 2015-04-30 MED ORDER — POTASSIUM CHLORIDE ER 10 MEQ PO TBCR
20.0000 meq | EXTENDED_RELEASE_TABLET | Freq: Two times a day (BID) | ORAL | Status: DC
  Administered 2015-04-30: 20 meq via ORAL
  Filled 2015-04-30 (×6): qty 2

## 2015-04-30 MED ORDER — LEVOTHYROXINE SODIUM 50 MCG PO TABS
250.0000 ug | ORAL_TABLET | Freq: Every day | ORAL | Status: DC
  Administered 2015-04-30 – 2015-05-02 (×3): 250 ug via ORAL
  Filled 2015-04-30 (×2): qty 1
  Filled 2015-04-30: qty 2
  Filled 2015-04-30: qty 1

## 2015-04-30 MED ORDER — TIZANIDINE HCL 4 MG PO TABS
4.0000 mg | ORAL_TABLET | Freq: Four times a day (QID) | ORAL | Status: DC
  Administered 2015-04-30 – 2015-05-02 (×10): 4 mg via ORAL
  Filled 2015-04-30 (×10): qty 1

## 2015-04-30 MED ORDER — IPRATROPIUM-ALBUTEROL 0.5-2.5 (3) MG/3ML IN SOLN
3.0000 mL | RESPIRATORY_TRACT | Status: DC
  Administered 2015-04-30: 3 mL via RESPIRATORY_TRACT
  Filled 2015-04-30: qty 3

## 2015-04-30 MED ORDER — LEVOFLOXACIN IN D5W 500 MG/100ML IV SOLN
500.0000 mg | Freq: Once | INTRAVENOUS | Status: AC
  Administered 2015-04-30: 500 mg via INTRAVENOUS
  Filled 2015-04-30: qty 100

## 2015-04-30 MED ORDER — PREDNISONE 20 MG PO TABS: 50.0000 mg | ORAL_TABLET | Freq: Every day | ORAL | Status: DC

## 2015-04-30 MED ORDER — ONDANSETRON HCL 4 MG/2ML IJ SOLN: 4.0000 mg | Freq: Four times a day (QID) | INTRAMUSCULAR | Status: DC | PRN

## 2015-04-30 MED ORDER — GUAIFENESIN ER 600 MG PO TB12
600.0000 mg | ORAL_TABLET | Freq: Two times a day (BID) | ORAL | Status: DC
  Administered 2015-04-30 – 2015-05-02 (×5): 600 mg via ORAL
  Filled 2015-04-30 (×5): qty 1

## 2015-04-30 MED ORDER — ONDANSETRON HCL 4 MG PO TABS
4.0000 mg | ORAL_TABLET | Freq: Four times a day (QID) | ORAL | Status: DC | PRN
  Administered 2015-05-02: 4 mg via ORAL
  Filled 2015-04-30: qty 1

## 2015-04-30 MED ORDER — INSULIN ASPART 100 UNIT/ML ~~LOC~~ SOLN
0.0000 [IU] | Freq: Every day | SUBCUTANEOUS | Status: DC
  Administered 2015-05-01: 2 [IU] via SUBCUTANEOUS

## 2015-04-30 MED ORDER — INSULIN DETEMIR 100 UNIT/ML ~~LOC~~ SOLN
50.0000 [IU] | Freq: Every day | SUBCUTANEOUS | Status: DC
  Administered 2015-04-30: 50 [IU] via SUBCUTANEOUS
  Filled 2015-04-30: qty 0.5

## 2015-04-30 MED ORDER — ASPIRIN 81 MG PO CHEW
324.0000 mg | CHEWABLE_TABLET | Freq: Once | ORAL | Status: AC
  Administered 2015-04-30: 324 mg via ORAL

## 2015-04-30 MED ORDER — NICOTINE 7 MG/24HR TD PT24
7.0000 mg | MEDICATED_PATCH | Freq: Every day | TRANSDERMAL | Status: DC
  Administered 2015-04-30 – 2015-05-02 (×3): 7 mg via TRANSDERMAL
  Filled 2015-04-30 (×5): qty 1

## 2015-04-30 MED ORDER — ALBUTEROL (5 MG/ML) CONTINUOUS INHALATION SOLN
15.0000 mg/h | INHALATION_SOLUTION | RESPIRATORY_TRACT | Status: DC
  Administered 2015-04-30: 15 mg/h via RESPIRATORY_TRACT
  Filled 2015-04-30: qty 20

## 2015-04-30 MED ORDER — SODIUM CHLORIDE 0.9 % IV SOLN
INTRAVENOUS | Status: DC
  Administered 2015-04-30: 10 mL/h via INTRAVENOUS
  Administered 2015-05-01: 05:00:00 via INTRAVENOUS

## 2015-04-30 MED ORDER — ASPIRIN EC 81 MG PO TBEC
81.0000 mg | DELAYED_RELEASE_TABLET | Freq: Every day | ORAL | Status: DC
  Administered 2015-04-30 – 2015-05-02 (×3): 81 mg via ORAL
  Filled 2015-04-30 (×3): qty 1

## 2015-04-30 MED ORDER — ALPRAZOLAM 1 MG PO TABS
2.0000 mg | ORAL_TABLET | Freq: Three times a day (TID) | ORAL | Status: DC | PRN
  Administered 2015-04-30 – 2015-05-02 (×4): 2 mg via ORAL
  Filled 2015-04-30 (×4): qty 2

## 2015-04-30 MED ORDER — METHYLPREDNISOLONE SODIUM SUCC 125 MG IJ SOLR
125.0000 mg | Freq: Once | INTRAMUSCULAR | Status: AC
  Administered 2015-04-30: 125 mg via INTRAVENOUS
  Filled 2015-04-30: qty 2

## 2015-04-30 MED ORDER — PANTOPRAZOLE SODIUM 40 MG PO TBEC
40.0000 mg | DELAYED_RELEASE_TABLET | Freq: Two times a day (BID) | ORAL | Status: DC
  Administered 2015-04-30 – 2015-05-02 (×5): 40 mg via ORAL
  Filled 2015-04-30 (×5): qty 1

## 2015-04-30 MED ORDER — POTASSIUM CHLORIDE CRYS ER 20 MEQ PO TBCR
20.0000 meq | EXTENDED_RELEASE_TABLET | Freq: Two times a day (BID) | ORAL | Status: DC
  Administered 2015-04-30: 20 meq via ORAL
  Filled 2015-04-30: qty 1

## 2015-04-30 MED ORDER — GABAPENTIN 600 MG PO TABS
1200.0000 mg | ORAL_TABLET | Freq: Two times a day (BID) | ORAL | Status: DC
  Filled 2015-04-30: qty 2

## 2015-04-30 NOTE — H&P (Signed)
PCP:   Neale Burly, MD   Chief Complaint:  Chest pain, shortness of breath  HPI:  64 year old female who  has a past medical history of Hypertension; Diabetes mellitus without complication ([place]); COPD (chronic obstructive pulmonary disease) ([place]); CHF (congestive heart failure) ([place]); Anxiety; Chronic back pain; Hypothyroidism; Pedal edema; On home O2; and Degenerative disk disease. Today presents to the hospital with chief complaint of left-sided chest pain, shortness of breath coughing. Patient has a history of COPD and is chronically on 2 L of oxygen at home. Patient says for the past 2 days she started having left-sided chest pressure, with shortness of breath and dry cough. Patient also complains of worsening swelling in the lower extremities. She is chronically on Lasix, as per patient her PCP is reduced the dose of Lasix to 20 mg twice a day. Since then the leg swelling got worse. Patient also says that right leg is bigger than the left one and she had duplex ultrasound of the lower extremities done last month which was negative for DVT. She described chest pain as a pressure radiating to the left arm and pain going towards the jaw. No history of CAD. She denies nausea vomiting, . Also complains of fever 10 24F at home. She is afebrile in the ED.Influenza panel by PCR is negative.   Allergies:   Allergies  Allergen Reactions  . Dilaudid [Hydromorphone Hcl] Itching  . Actifed Cold-Allergy [Chlorpheniramine-Phenylephrine]     "I was sick and red and it didn't agree with me at all"  . Doxycycline Nausea And Vomiting  . Other Cough    Pt states she is allergic to ragweed and that she starts coughing and sneezing like crazy  . Penicillins Hives and Itching    Has patient had a PCN reaction causing immediate rash, facial/tongue/throat swelling, SOB or lightheadedness with hypotension: Yes Has patient had a PCN reaction causing severe rash involving mucus membranes or skin necrosis:  No Has patient had a PCN reaction that required hospitalization Yes Has patient had a PCN reaction occurring within the last 10 years: No If all of the above answers are "NO", then may proceed with Cephalosporin use.   . Reglan [Metoclopramide] Itching  . Valium [Diazepam] Itching  . Vistaril [Hydroxyzine Hcl] Itching      Past Medical History  Diagnosis Date  . Hypertension   . Diabetes mellitus without complication ([place])   . COPD (chronic obstructive pulmonary disease) ([place])   . CHF (congestive heart failure) ([facility])     Diastolic  . Anxiety   . Chronic back pain   . Hypothyroidism   . Pedal edema   . On home O2     2.5 L N/C prn  . Degenerative disk disease     Past Surgical History  Procedure Laterality Date  . Tubal ligation    . Hernia repair      Prior to Admission medications   Medication Sig Start Date End Date Taking? Authorizing Provider  albuterol (PROAIR HFA) 108 (90 BASE) MCG/ACT inhaler Inhale 1 puff into the lungs every 4 (four) hours as needed. For shortness of breath Patient taking differently: Inhale 2 puffs into the lungs every 4 (four) hours as needed. For shortness of breath 05/30/13   Kinnie Feil, MD  alprazolam Duanne Moron) 2 MG tablet Take 1 tablet (2 mg total) by mouth 3 (three) times daily as needed for anxiety. 11/10/14   Radene Gunning, NP  aspirin EC 81 MG tablet Take  81 mg by mouth daily.    Historical Provider, MD  benzonatate (TESSALON) 100 MG capsule Take 1 capsule (100 mg total) by mouth 3 (three) times daily as needed for cough. Patient taking differently: Take 200 mg by mouth 2 (two) times daily as needed for cough.  12/31/13   Jola Schmidt, MD  Bisacodyl (FLEET LAXATIVE RE) Place 1 application rectally daily as needed (constipation).    Historical Provider, MD  clindamycin (CLEOCIN) 300 MG capsule Take 1 capsule (300 mg total) by mouth 3 (three) times daily. 03/25/15   Donne Hazel, MD  diltiazem (CARDIZEM CD) 240 MG 24 hr capsule Take 1  capsule (240 mg total) by mouth daily. 05/30/13   Kinnie Feil, MD  empagliflozin (JARDIANCE) 25 MG TABS tablet Take 25 mg by mouth daily.    Historical Provider, MD  furosemide (LASIX) 20 MG tablet Take 2 tablets (40 mg total) by mouth 2 (two) times daily. 12/06/14   Erline Hau, MD  gabapentin (NEURONTIN) 600 MG tablet Take 1 tablet (600 mg total) by mouth 4 (four) times daily. Patient taking differently: Take 1,200 mg by mouth 2 (two) times daily.  05/30/13   Kinnie Feil, MD  insulin aspart (NOVOLOG) 100 UNIT/ML injection Inject 0-20 Units into the skin 3 (three) times daily with meals. Takes 20 Units in am and 20 Units at night.  Per sliding scale 05/30/13   Kinnie Feil, MD  insulin detemir (LEVEMIR) 100 UNIT/ML injection Inject 0.2 mLs (20 Units total) into the skin at bedtime. Patient taking differently: Inject 40 Units into the skin at bedtime.  12/06/14   Erline Hau, MD  ipratropium-albuterol (DUONEB) 0.5-2.5 (3) MG/3ML SOLN Take 3 mLs by nebulization every 6 (six) hours. Patient taking differently: Take 3 mLs by nebulization 3 (three) times daily. *may also use as needed* 05/30/13   Kinnie Feil, MD  levofloxacin (LEVAQUIN) 500 MG tablet Take 1 tablet (500 mg total) by mouth daily. 03/25/15   Donne Hazel, MD  levothyroxine (SYNTHROID, LEVOTHROID) 125 MCG tablet Take 2 tablets (250 mcg total) by mouth daily before breakfast. 05/30/13   Kinnie Feil, MD  menthol-cetylpyridinium (CEPACOL) 3 MG lozenge Take 1 lozenge (3 mg total) by mouth as needed for sore throat. 03/25/15   Donne Hazel, MD  metFORMIN (GLUCOPHAGE) 500 MG tablet Take 500 mg by mouth 2 (two) times daily with a meal.    Historical Provider, MD  naproxen sodium (ALEVE) 220 MG tablet Take 220 mg by mouth daily as needed (for pain).    Historical Provider, MD  nicotine (NICODERM CQ - DOSED IN MG/24 HOURS) 21 mg/24hr patch Place 1 patch (21 mg total) onto the skin daily. 05/30/13    Kinnie Feil, MD  nystatin (MYCOSTATIN/NYSTOP) 100000 UNIT/GM POWD Apply 1 g topically 2 (two) times daily.    Historical Provider, MD  Omega-3 Fatty Acids (FISH OIL PO) Take 1 capsule by mouth 2 (two) times daily.     Historical Provider, MD  ondansetron (ZOFRAN ODT) 8 MG disintegrating tablet Take 1 tablet (8 mg total) by mouth every 8 (eight) hours as needed for nausea or vomiting. 12/31/13   Jola Schmidt, MD  pantoprazole (PROTONIX) 40 MG tablet Take 1 tablet (40 mg total) by mouth 2 (two) times daily. 11/10/14   Radene Gunning, NP  potassium chloride (K-DUR) 10 MEQ tablet Take 20 mEq by mouth 2 (two) times daily.     Historical Provider, MD  saccharomyces boulardii (FLORASTOR) 250 MG capsule Take 1 capsule (250 mg total) by mouth 2 (two) times daily. 03/25/15   Donne Hazel, MD  tiZANidine (ZANAFLEX) 4 MG tablet Take 4 mg by mouth 4 (four) times daily.    Historical Provider, MD    Social History:  reports that she has been smoking Cigarettes.  She has been smoking about 0.25 packs per day. She has never used smokeless tobacco. She reports that she does not drink alcohol or use illicit drugs.  Family History  Problem Relation Age of Onset  . Stroke Mother   . Heart attack Father   . Diabetes Brother   . Cerebral palsy Brother   . Pneumonia Brother   . Diabetes Other   . Heart attack Other     There were no vitals filed for this visit.  All the positives are listed in BOLD  Review of Systems:  HEENT: Headache, blurred vision, runny nose, sore throat Neck: Hypothyroidism, hyperthyroidism,,lymphadenopathy Chest : Shortness of breath, history of COPD, Asthma Heart : Chest pain, history of coronary arterey disease GI:  Nausea, vomiting, diarrhea, constipation, GERD GU: Dysuria, urgency, frequency of urination, hematuria Neuro: Stroke, seizures, syncope Psych: Depression, anxiety, hallucinations   Physical Exam: Blood pressure 134/77, pulse 74, temperature 98.3 F (36.8 C),  temperature source Rectal, resp. rate 22, height '5\' 6"'$  (1.676 m), SpO2 100 %. Constitutional:   Patient is a well-developed and well-nourished female* in no acute distress and cooperative with exam. Head: Normocephalic and atraumatic Mouth: Mucus membranes moist Eyes: PERRL, EOMI, conjunctivae normal Neck: Supple, No Thyromegaly Cardiovascular: RRR, S1 normal, S2 normal Pulmonary/Chest: Bilateral wheezing Abdominal: Soft. Non-tender, non-distended, bowel sounds are normal, no masses, organomegaly, or guarding present.  Neurological: A&O x3, Strength is normal and symmetric bilaterally, cranial nerve II-XII are grossly intact, no focal motor deficit, sensory intact to light touch bilaterally.  Extremities : No Cyanosis, Clubbing , 1+ edema of the lower extremities right more than left  Labs on Admission:  Basic Metabolic Panel:  Recent Labs Lab 04/30/15 0358  NA 139  K 4.5  CL 107  CO2 25  GLUCOSE 167*  BUN 21*  CREATININE 1.24*  CALCIUM 8.9   CBC:  Recent Labs Lab 04/30/15 0358  WBC 8.3  NEUTROABS 4.0  HGB 13.0  HCT 40.6  MCV 82.7  PLT 291   Cardiac Enzymes:  Recent Labs Lab 04/30/15 0358  TROPONINI <0.03    BNP (last 3 results)  Recent Labs  12/04/14 0456 03/24/15 1538 04/30/15 0358  BNP 50.0 170.0* 37.0     Radiological Exams on Admission: Dg Chest Port 1 View  04/30/2015  CLINICAL DATA:  Shortness of breath EXAM: PORTABLE CHEST 1 VIEW COMPARISON:  02/22/2015 FINDINGS: Haziness of the lower chest is likely from soft tissue attenuation. There is no edema, convincing consolidation, effusion, or pneumothorax. No cardiomegaly or aortic contour change when accounting for rotation and portable technique. IMPRESSION: Limited portable chest without acute finding. Electronically Signed   By: Monte Fantasia M.D.   On: 04/30/2015 04:48    EKG: Independently reviewed. Normal sinus rhythm   Assessment/Plan Active Problems:   DM type 2 (diabetes mellitus,  type 2) ([facility])   Hypothyroidism (acquired)   Chronic diastolic CHF (congestive heart failure) ([facility])   Peripheral edema   COPD exacerbation ([facility])  COPD exacerbation Admit the patient to telemetry under observation, start Solu-Medrol 60 mg IV every 6 hours, Levaquin per pharmacy consultation, DuoNeb nebulizers every 6 hours. Mucinex 1  tablet by mouth twice a day.   Chest pain Patient presenting with left-sided chest pressure with radiation to left arm and jaw, EKG shows normal sinus rhythm. Will cycle troponin every 6 hours 3. First troponin in the ED is negative.  Chronic diastolic CHF Patient has chronic diastolic CHF, currently compensated. Chest x-ray showed no pulmonary edema. By mouth Lasix is on hold. BNP 37  Lower extremity edema Patient has chronic lower extremity edema, will give 1 dose of Lasix 20 g IV 1. I will hold by mouth Lasix at this time due to patient's worsening renal function. Follow BMP in a.m. to decide when to restart patient's by mouth Lasix.  Diabetes mellitus Continue Lantus, hold oral hypoglycemic agents. Start sliding scale insulin with NovoLog.  Hypothyroidism Continue Synthroid  DVT prophylaxis Lovenox  Code status: DO NOT RESUSCITATE  Family discussion: No family present at bedside   Time Spent on Admission: 16 min  [facility] Pager: [phone] 04/30/2015, 6:26 AM  If 7PM-7AM, please contact night-coverage  [email or web address]  Password [email or web address]

## 2015-04-30 NOTE — Care Management Note (Signed)
Case Management Note  Patient Details  Name: Stephanie Combs MRN: [id number] Date of Birth: 01/21/1951  Subjective/Objective:                  Pt admitted from home with COPD exacerbation. Pt lives alone and will return home at discharge. Pt is on home O2 and has neb machine for home use. Pt stated she does her ADl's without assistance.  Action/Plan: No CM needs noted.  Expected Discharge Date:                  Expected Discharge Plan:  Home/Self Care  In-House Referral:  NA  Discharge planning Services  CM Consult  Post Acute Care Choice:  NA Choice offered to:  NA  DME Arranged:    DME Agency:     HH Arranged:    HH Agency:     Status of Service:  Completed, signed off  Medicare Important Message Given:    Date Medicare IM Given:    Medicare IM give by:    Date Additional Medicare IM Given:    Additional Medicare Important Message give by:     If discussed at [place] of Stay Meetings, dates discussed:    Additional Comments:  Joylene Draft, RN 04/30/2015, 4:23 PM

## 2015-04-30 NOTE — Progress Notes (Signed)
Notified Dr. Clementeen Graham of the patients last 2 reading being 500 plus even with the new meal coverage given.  Waiting on call return.

## 2015-04-30 NOTE — Progress Notes (Signed)
[place] for Levofloxacin Indication: COPD exacerbation/bronchitis  Allergies  Allergen Reactions  . Dilaudid [Hydromorphone Hcl] Itching  . Actifed Cold-Allergy [Chlorpheniramine-Phenylephrine]     "I was sick and red and it didn't agree with me at all"  . Doxycycline Nausea And Vomiting  . Other Cough    Pt states she is allergic to ragweed and that she starts coughing and sneezing like crazy  . Penicillins Hives and Itching    Has patient had a PCN reaction causing immediate rash, facial/tongue/throat swelling, SOB or lightheadedness with hypotension: Yes Has patient had a PCN reaction causing severe rash involving mucus membranes or skin necrosis: No Has patient had a PCN reaction that required hospitalization Yes Has patient had a PCN reaction occurring within the last 10 years: No If all of the above answers are "NO", then may proceed with Cephalosporin use.   . Reglan [Metoclopramide] Itching  . Valium [Diazepam] Itching  . Vistaril [Hydroxyzine Hcl] Itching    Patient Measurements: Height: '5\' 6"'$  (167.6 cm) Weight: 263 lb 14.3 oz (119.7 kg) IBW/kg (Calculated) : 59.3   Vital Signs: Temp: 98 F (36.7 C) (12/19 0655) Temp Source: Rectal (12/19 0429) BP: 141/56 mmHg (12/19 0655) Pulse Rate: 92 (12/19 0655)  Labs:  Recent Labs  04/30/15 0358  WBC 8.3  HGB 13.0  PLT 291  CREATININE 1.24*    Estimated Creatinine Clearance: 59.6 mL/min (by C-G formula based on Cr of 1.24).  No results for input(s): VANCOTROUGH, VANCOPEAK, VANCORANDOM, GENTTROUGH, GENTPEAK, GENTRANDOM, TOBRATROUGH, TOBRAPEAK, TOBRARND, AMIKACINPEAK, AMIKACINTROU, AMIKACIN in the last 72 hours.   Microbiology: No results found for this or any previous visit (from the past 720 hour(s)).  Medical History: Past Medical History  Diagnosis Date  . Hypertension   . Diabetes mellitus without complication ([place])   . COPD (chronic obstructive pulmonary  disease) ([place])   . CHF (congestive heart failure) ([facility])     Diastolic  . Anxiety   . Chronic back pain   . Hypothyroidism   . Pedal edema   . On home O2     2.5 L N/C prn  . Degenerative disk disease     Medications:   Assessment: 64 yo female with hx of COPD seen in the Ed for increased shortness of breath and left-sided chest pain. Pt reports fever but is afebrile in the ED. Pt will be admitted for exacerbation of COPD and bronchitis and started on IV antibiotics.  Goal of Therapy:  Eradicate infection  Plan:  Preliminary review of pertinent patient information completed.  Protocol will be initiated with a one-time dose of levofloxacin 500 mg IV.  Forestine Na clinical pharmacist will complete review during morning rounds to assess patient and finalize treatment regimen.  Norberto Sorenson, [facility] 04/30/2015,7:24 AM

## 2015-04-30 NOTE — ED Notes (Signed)
Pt c/o sob, chest pain that radiates to back area that started tonight, also reports that she has had a cough for the past two weeks,

## 2015-04-30 NOTE — Progress Notes (Signed)
[facility] PROGRESS NOTE  NAARA KELTY LFY:[id number] DOB: 10/19/1950 DOA: 04/30/2015 PCP: Neale Burly, MD  Brief narrative 64 year old obese female with history of uncontrolled type II mellitus, COPD on home O2, diastolic CHF, anxiety, chronic back pain, hypothyroidism, hypertension, degenerative disc disease who presented to the hospital with left-sided chest pain shortness of breath and coughing for the past 2 days. She reported having left-sided chest pressure worsened with respiration and associated with nonproductive cough. She also has noticed increased swelling in her legs (R>>L). She has been on Lasix the dose of which was reduced by her PCP recently. Since then her swelling has gotten worse and she even have Doppler of her right leg done one month back which was negative for DVT. She also reports a fever of upto 104 Fahrenheit at home 2-3 days back. Patient admitted for acute COPD exacerbation.  Assessment/Plan: Acute exacerbation of COPD. On scheduled Solu-Medrol, DuoNeb and empiric Levaquin. Continue O2 via nasal cannula. Flu PCR negative. Her clinical signs and symptoms appear mild on exam this morning. Will switch her Solu-Medrol to oral prednisone. (Has worsened her blood glucose) -Supportive care with Tylenol and antitussives. - smokes about 6 or 7 cigarettes a day and counseled strongly on cessation. Ordered nicotine patch and really prescription upon discharge.  Left-sided chest pain Appears to be pleuritic associated with COPD exacerbation and? Acute Bronchitis Resolved at this time. Serial  troponin negative. KG unremarkable. Would not pursue further workup.  Worsened bilateral leg edema R>L No signs of acute CHF exacerbation. Her right leg is definitely more swollen than the left although no pitting edema observed. However patient insists that she has developed fluid all over. Does not appear volume overloaded on my exam. Will check CT of abdomen and pelvis to  rule out any into abdominal or pelvic venous compression. Continue home dose Lasix.  Uncontrolled type 2 diabetes mellitus with peripheral neuropathy Patient on 40 units of Levemir at home with prenatal aspart. Blood glucose worsened with IV Solu-Medrol. Have increased Levemir dose to 50 units and added pre-meal aspart 6 units 3 times a day. Continue Neurontin. Check A1c. Check UA. She will likely need to be on ACE inhibitor or ARB.  Morbid obesity Needs counseling on diet and exercise to lose weight.  Hypothyroidism Continue Synthroid.  GERD Continue PPI  Chronic back pain Reports taking Percocet at home which has been resumed. Continue Zanaflex.  Anxiety Resumed her Xanax.  Essential hypertension Continue Cardizem.   CK D stage II At baseline.  DVT prophylaxis: Subcutaneous Lovenox Diet: Diabetic  Code Status: DO NOT RESUSCITATE Family Communication: None at bedside Disposition Plan: Home possibly in a.m. if symptoms improved   Consultants:  None  Procedures:  CT abdomen and pelvis ordered  Antibiotics: Levaquin   HPI/Subjective: Seen and examined. Admission H&P reviewed. Reports of breathing to be better.  Objective: Filed Vitals:   04/30/15 0429 04/30/15 0655  BP:  141/56  Pulse:  92  Temp: 98.3 F (36.8 C) 98 F (36.7 C)  Resp:      Intake/Output Summary (Last 24 hours) at 04/30/15 1404 Last data filed at 04/30/15 0853  Gross per 24 hour  Intake    360 ml  Output      0 ml  Net    360 ml   Filed Weights   04/30/15 0700  Weight: 119.7 kg (263 lb 14.3 oz)    Exam:   General:  Elderly obese female not in distress  HEENT: No pallor,  moist oral mucosa, supple neck  Chest: Diminished bibasilar breath sounds, no added sounds  CVS: Normal S1 and S2, no murmurs rub or gallop  GI: Soft, nondistended, nontender  Musculoskeletal: Warm, bilateral leg swellings (R >L)    Data Reviewed: Basic Metabolic Panel:  Recent Labs Lab  04/30/15 0358  NA 139  K 4.5  CL 107  CO2 25  GLUCOSE 167*  BUN 21*  CREATININE 1.24*  CALCIUM 8.9   Liver Function Tests: No results for input(s): AST, ALT, ALKPHOS, BILITOT, PROT, ALBUMIN in the last 168 hours. No results for input(s): LIPASE, AMYLASE in the last 168 hours. No results for input(s): AMMONIA in the last 168 hours. CBC:  Recent Labs Lab 04/30/15 0358  WBC 8.3  NEUTROABS 4.0  HGB 13.0  HCT 40.6  MCV 82.7  PLT 291   Cardiac Enzymes:  Recent Labs Lab 04/30/15 0358 04/30/15 0720  TROPONINI <0.03 <0.03   BNP (last 3 results)  Recent Labs  12/04/14 0456 03/24/15 1538 04/30/15 0358  BNP 50.0 170.0* 37.0    ProBNP (last 3 results) No results for input(s): PROBNP in the last 8760 hours.  CBG:  Recent Labs Lab 04/30/15 0710 04/30/15 1202  GLUCAP 235* 466*    No results found for this or any previous visit (from the past 240 hour(s)).   Studies: Ct Abdomen Pelvis Wo Contrast  04/30/2015  CLINICAL DATA:  Inpatient. Right lower extremity swelling. CHF. COPD. EXAM: CT ABDOMEN AND PELVIS WITHOUT CONTRAST TECHNIQUE: Multidetector CT imaging of the abdomen and pelvis was performed following the standard protocol without IV contrast. COMPARISON:  Report from 11/14/2001 CT abdomen/ pelvis (images not available). 07/27/2008 chest CT angiogram. 04/12/2006 abdominal sonogram. FINDINGS: Lower chest: No significant pulmonary nodules or acute consolidative airspace disease. Left anterior descending coronary atherosclerosis. Hepatobiliary: Mild heterogeneous hepatic steatosis. No liver mass. Normal gallbladder with no radiopaque cholelithiasis. No biliary ductal dilatation. Pancreas: Normal, with no mass or duct dilation. Spleen: Normal size. No mass. Adrenals/Urinary Tract: Normal right adrenal. Left adrenal 2.1 cm adenoma (series 3/ image 31). No hydronephrosis. Punctate nonobstructing 1-2 mm stones in the interpolar right kidney. Nonobstructing 2 mm stone in the  upper left kidney. Simple parapelvic renal cyst in the lower left renal sinus. No additional contour deforming renal masses. Normal bladder. Stomach/Bowel: Grossly normal stomach. Normal caliber small bowel with no small bowel wall thickening. Appendix is not discretely visualized. Normal large bowel with no diverticulosis, large bowel wall thickening or pericolonic fat stranding. Oral contrast progresses to the distal colon. Vascular/Lymphatic: Atherosclerotic nonaneurysmal abdominal aorta. No pathologically enlarged lymph nodes in the abdomen or pelvis. Reproductive: Grossly normal uterus.  No adnexal mass. Other: No pneumoperitoneum, ascites or focal fluid collection. Musculoskeletal: No aggressive appearing focal osseous lesions. Sclerotic focus in the T10 vertebral body is stable since 07/27/2008, in keeping with a benign etiology. Severe degenerative disc disease at L4-5 with mild degenerative disc disease throughout the remaining visualized thoracolumbar spine. IMPRESSION: 1. No noncontrast CT findings in the abdomen or pelvis to explain the lower extremity edema. No abdominopelvic lymphadenopathy. 2. No acute abnormality. No evidence of bowel obstruction or acute bowel inflammation. 3. Nonobstructing tiny stones in both kidneys.  No hydronephrosis. 4. Mild heterogeneous hepatic steatosis. 5. Coronary atherosclerosis. Electronically Signed   By: Ilona Sorrel M.D.   On: 04/30/2015 13:13   Dg Chest Port 1 View  04/30/2015  CLINICAL DATA:  Shortness of breath EXAM: PORTABLE CHEST 1 VIEW COMPARISON:  02/22/2015 FINDINGS: Haziness of the lower  chest is likely from soft tissue attenuation. There is no edema, convincing consolidation, effusion, or pneumothorax. No cardiomegaly or aortic contour change when accounting for rotation and portable technique. IMPRESSION: Limited portable chest without acute finding. Electronically Signed   By: Monte Fantasia M.D.   On: 04/30/2015 04:48    Scheduled Meds: .  aspirin EC  81 mg Oral Daily  . diltiazem  240 mg Oral Daily  . enoxaparin (LOVENOX) injection  40 mg Subcutaneous Q24H  . gabapentin  1,200 mg Oral BID  . guaiFENesin  600 mg Oral BID  . insulin aspart  0-5 Units Subcutaneous QHS  . insulin aspart  0-9 Units Subcutaneous TID WC  . insulin aspart  6 Units Subcutaneous TID WC  . insulin detemir  50 Units Subcutaneous QHS  . ipratropium-albuterol  3 mL Nebulization Q6H  . [START ON 05/01/2015] levofloxacin (LEVAQUIN) IV  500 mg Intravenous Q24H  . levothyroxine  250 mcg Oral QAC breakfast  . methylPREDNISolone (SOLU-MEDROL) injection  60 mg Intravenous Q6H  . nicotine  7 mg Transdermal Daily  . pantoprazole  40 mg Oral BID  . potassium chloride  20 mEq Oral BID  . tiZANidine  4 mg Oral QID   Continuous Infusions: . sodium chloride 10 mL/hr (04/30/15 1035)      Time spent: 20 minutes    Jan Olano  [facility] Pager [phone]. If 7PM-7AM, please contact night-coverage at [email or web address], password [facility] 04/30/2015, 2:04 PM

## 2015-04-30 NOTE — ED Provider Notes (Signed)
TIME SEEN: 4:15 AM  CHIEF COMPLAINT: Chest pain, shortness of breath, cough, wheezing  HPI: Pt is a 64 y.o. female with history of hypertension, diabetes, CHF, COPD who is chronically on 2-1/2 L of oxygen at home who presents to the emergency department with 2-3 days of left-sided chest pressure, burning, shortness of breath, dry cough, wheezing, subjective fevers, generalized weakness, lightheadedness. Also feels like both of her legs have been swelling but feels her right leg is more swollen than her left. No history of PE or DVT but has had superficial thrombophlebitis in the past. Not on anticoagulation. States that this feels like pneumonia. No no sick contacts or recent travel. No vomiting but has had some diarrhea.  PCP is Hasanaj  ROS: See HPI Constitutional: Subjective fever  Eyes: no drainage  ENT: no runny nose   Cardiovascular:   chest pain  Resp:  SOB  GI: no vomiting GU: no dysuria Integumentary: no rash  Allergy: no hives  Musculoskeletal: B/L leg swelling  Neurological: no slurred speech ROS otherwise negative  PAST MEDICAL HISTORY/PAST SURGICAL HISTORY:  Past Medical History  Diagnosis Date  . Hypertension   . Diabetes mellitus without complication ([place])   . COPD (chronic obstructive pulmonary disease) ([place])   . CHF (congestive heart failure) ([facility])     Diastolic  . Anxiety   . Chronic back pain   . Hypothyroidism   . Pedal edema   . On home O2     2.5 L N/C prn  . Degenerative disk disease     MEDICATIONS:  Prior to Admission medications   Medication Sig Start Date End Date Taking? Authorizing Provider  albuterol (PROAIR HFA) 108 (90 BASE) MCG/ACT inhaler Inhale 1 puff into the lungs every 4 (four) hours as needed. For shortness of breath Patient taking differently: Inhale 2 puffs into the lungs every 4 (four) hours as needed. For shortness of breath 05/30/13   Kinnie Feil, MD  alprazolam Duanne Moron) 2 MG tablet Take 1 tablet (2 mg total) by mouth 3  (three) times daily as needed for anxiety. 11/10/14   Radene Gunning, NP  aspirin EC 81 MG tablet Take 81 mg by mouth daily.    Historical Provider, MD  benzonatate (TESSALON) 100 MG capsule Take 1 capsule (100 mg total) by mouth 3 (three) times daily as needed for cough. Patient taking differently: Take 200 mg by mouth 2 (two) times daily as needed for cough.  12/31/13   Jola Schmidt, MD  Bisacodyl (FLEET LAXATIVE RE) Place 1 application rectally daily as needed (constipation).    Historical Provider, MD  clindamycin (CLEOCIN) 300 MG capsule Take 1 capsule (300 mg total) by mouth 3 (three) times daily. 03/25/15   Donne Hazel, MD  diltiazem (CARDIZEM CD) 240 MG 24 hr capsule Take 1 capsule (240 mg total) by mouth daily. 05/30/13   Kinnie Feil, MD  empagliflozin (JARDIANCE) 25 MG TABS tablet Take 25 mg by mouth daily.    Historical Provider, MD  furosemide (LASIX) 20 MG tablet Take 2 tablets (40 mg total) by mouth 2 (two) times daily. 12/06/14   Erline Hau, MD  gabapentin (NEURONTIN) 600 MG tablet Take 1 tablet (600 mg total) by mouth 4 (four) times daily. Patient taking differently: Take 1,200 mg by mouth 2 (two) times daily.  05/30/13   Kinnie Feil, MD  insulin aspart (NOVOLOG) 100 UNIT/ML injection Inject 0-20 Units into the skin 3 (three) times daily with meals. Takes  20 Units in am and 20 Units at night.  Per sliding scale 05/30/13   Kinnie Feil, MD  insulin detemir (LEVEMIR) 100 UNIT/ML injection Inject 0.2 mLs (20 Units total) into the skin at bedtime. Patient taking differently: Inject 40 Units into the skin at bedtime.  12/06/14   Erline Hau, MD  ipratropium-albuterol (DUONEB) 0.5-2.5 (3) MG/3ML SOLN Take 3 mLs by nebulization every 6 (six) hours. Patient taking differently: Take 3 mLs by nebulization 3 (three) times daily. *may also use as needed* 05/30/13   Kinnie Feil, MD  levofloxacin (LEVAQUIN) 500 MG tablet Take 1 tablet (500 mg total) by mouth  daily. 03/25/15   Donne Hazel, MD  levothyroxine (SYNTHROID, LEVOTHROID) 125 MCG tablet Take 2 tablets (250 mcg total) by mouth daily before breakfast. 05/30/13   Kinnie Feil, MD  menthol-cetylpyridinium (CEPACOL) 3 MG lozenge Take 1 lozenge (3 mg total) by mouth as needed for sore throat. 03/25/15   Donne Hazel, MD  metFORMIN (GLUCOPHAGE) 500 MG tablet Take 500 mg by mouth 2 (two) times daily with a meal.    Historical Provider, MD  naproxen sodium (ALEVE) 220 MG tablet Take 220 mg by mouth daily as needed (for pain).    Historical Provider, MD  nicotine (NICODERM CQ - DOSED IN MG/24 HOURS) 21 mg/24hr patch Place 1 patch (21 mg total) onto the skin daily. 05/30/13   Kinnie Feil, MD  nystatin (MYCOSTATIN/NYSTOP) 100000 UNIT/GM POWD Apply 1 g topically 2 (two) times daily.    Historical Provider, MD  Omega-3 Fatty Acids (FISH OIL PO) Take 1 capsule by mouth 2 (two) times daily.     Historical Provider, MD  ondansetron (ZOFRAN ODT) 8 MG disintegrating tablet Take 1 tablet (8 mg total) by mouth every 8 (eight) hours as needed for nausea or vomiting. 12/31/13   Jola Schmidt, MD  pantoprazole (PROTONIX) 40 MG tablet Take 1 tablet (40 mg total) by mouth 2 (two) times daily. 11/10/14   Radene Gunning, NP  potassium chloride (K-DUR) 10 MEQ tablet Take 20 mEq by mouth 2 (two) times daily.     Historical Provider, MD  saccharomyces boulardii (FLORASTOR) 250 MG capsule Take 1 capsule (250 mg total) by mouth 2 (two) times daily. 03/25/15   Donne Hazel, MD  tiZANidine (ZANAFLEX) 4 MG tablet Take 4 mg by mouth 4 (four) times daily.    Historical Provider, MD    ALLERGIES:  Allergies  Allergen Reactions  . Dilaudid [Hydromorphone Hcl] Itching  . Actifed Cold-Allergy [Chlorpheniramine-Phenylephrine]     "I was sick and red and it didn't agree with me at all"  . Doxycycline Nausea And Vomiting  . Other Cough    Pt states she is allergic to ragweed and that she starts coughing and sneezing like  crazy  . Penicillins Hives and Itching    Has patient had a PCN reaction causing immediate rash, facial/tongue/throat swelling, SOB or lightheadedness with hypotension: Yes Has patient had a PCN reaction causing severe rash involving mucus membranes or skin necrosis: No Has patient had a PCN reaction that required hospitalization Yes Has patient had a PCN reaction occurring within the last 10 years: No If all of the above answers are "NO", then may proceed with Cephalosporin use.   . Reglan [Metoclopramide] Itching  . Valium [Diazepam] Itching  . Vistaril [Hydroxyzine Hcl] Itching    SOCIAL HISTORY:  Social History  Substance Use Topics  . Smoking status: Current Every Day  Smoker -- 0.25 packs/day    Types: Cigarettes  . Smokeless tobacco: Never Used  . Alcohol Use: No    FAMILY HISTORY: Family History  Problem Relation Age of Onset  . Stroke Mother   . Heart attack Father   . Diabetes Brother   . Cerebral palsy Brother   . Pneumonia Brother   . Diabetes Other   . Heart attack Other     EXAM: BP 134/77 mmHg  Pulse 74  Temp(Src) 98.1 F (36.7 C) (Oral)  Resp 22  Ht '5\' 6"'$  (1.676 m)  SpO2 100% CONSTITUTIONAL: Alert and oriented and responds appropriately to questions. Chronically ill-appearing, oxygen dependent, obese HEAD: Normocephalic EYES: Conjunctivae clear, PERRL ENT: normal nose; no rhinorrhea; moist mucous membranes; pharynx without lesions noted NECK: Supple, no meningismus, no LAD  CARD: RRR; S1 and S2 appreciated; no murmurs, no clicks, no rubs, no gallops RESP: Normal chest excursion without splinting or tachypnea; breath sounds are equal bilaterally but has diffuse rhonchorous and wheezing breath sounds bilaterally, no rales, no hypoxia on her normal 2.5 to 3 L of oxygen, speaking full sentences when laying at rest but whenever she moves she began speaking short sentences and becomes tachypneic ABD/GI: Normal bowel sounds; non-distended; soft, non-tender, no  rebound, no guarding, no peritoneal signs BACK:  The back appears normal and is non-tender to palpation, there is no CVA tenderness EXT: Normal ROM in all joints; non-tender to palpation; bilateral lower extremities appear large but there is no pitting edema seen on exam, it is difficult to determine if she truly has swelling in her legs given her morbid obesity; normal capillary refill; no cyanosis, no calf tenderness or swelling    SKIN: Normal color for age and race; warm NEURO: Moves all extremities equally, sensation to light touch intact diffusely, cranial nerves II through XII intact PSYCH: The patient's mood and manner are appropriate. Grooming and personal hygiene are appropriate.  MEDICAL DECISION MAKING: Patient here with chest pain, shortness of breath, wheezing. Differential diagnosis includes ACS, COPD exacerbation, pneumonia, CHF exacerbation. She feels like her right leg is more swollen than her left but I do not appreciate this difference. PE and DVT are also on the differential. Will obtain labs, chest x-ray. We'll obtain a rectal temperature. We'll give continuous epidural treatment, IV steroids. We'll also obtain influenza swab. EKG shows no ischemic changes. We'll also give aspirin nitroglycerin for her chest pain as she does have many risk factors for ACS.  ED PROGRESS: Patient's labs are unremarkable. Rectal temperature is normal. Troponin negative. BNP normal. Chest x-ray clear. Suspect that this is all a COPD exacerbation. Influenza swab is pending. She has received continuous albuterol for over one hour and is still has a wheezing and is diminished at her bases. She does not have any hypoxia on her chronic oxygen but given her persistent wheezing and shortness of breath I feel she will need admission for COPD exacerbation. Her tachypnea and work of breathing with any minimal exertion has improved after treatment. We'll continue duo nebs. Will discuss with hospitalist.  6:00 AM   D/w Dr. Darrick Meigs for admission to obs, tele.  Patient comfortable with this plan. I will place holding orders per hospitalist request.     EKG Interpretation  Date/Time:  Monday April 30 2015 04:20:59 EST Ventricular Rate:  70 PR Interval:  177 QRS Duration: 93 QT Interval:  436 QTC Calculation: 470 R Axis:   85 Text Interpretation:  Sinus rhythm Borderline right axis deviation No  significant change since last tracing Confirmed by Marshawn Ninneman,  DO, Cuca Benassi [id number]) on 04/30/2015 4:26:54 AM        CRITICAL CARE Performed by: Nyra Jabs   Total critical care time: 35 minutes - COPD exacerbation requiring continuous albuterol, DuoNebs, IV steroids  Critical care time was exclusive of separately billable procedures and treating other patients.  Critical care was necessary to treat or prevent imminent or life-threatening deterioration.  Critical care was time spent personally by me on the following activities: development of treatment plan with patient and/or surrogate as well as nursing, discussions with consultants, evaluation of patient's response to treatment, examination of patient, obtaining history from patient or surrogate, ordering and performing treatments and interventions, ordering and review of laboratory studies, ordering and review of radiographic studies, pulse oximetry and re-evaluation of patient's condition.   [place], DO 04/30/15 [phone]

## 2015-04-30 NOTE — Progress Notes (Signed)
ANTIBIOTIC CONSULT NOTE - INITIAL  Pharmacy Consult for Levaquin Indication: COPD exacerbation/ bronchitis  Allergies  Allergen Reactions  . Dilaudid [Hydromorphone Hcl] Itching  . Actifed Cold-Allergy [Chlorpheniramine-Phenylephrine]     "I was sick and red and it didn't agree with me at all"  . Doxycycline Nausea And Vomiting  . Other Cough    Pt states she is allergic to ragweed and that she starts coughing and sneezing like crazy  . Penicillins Hives and Itching    Has patient had a PCN reaction causing immediate rash, facial/tongue/throat swelling, SOB or lightheadedness with hypotension: Yes Has patient had a PCN reaction causing severe rash involving mucus membranes or skin necrosis: No Has patient had a PCN reaction that required hospitalization Yes Has patient had a PCN reaction occurring within the last 10 years: No If all of the above answers are "NO", then may proceed with Cephalosporin use.   . Reglan [Metoclopramide] Itching  . Valium [Diazepam] Itching  . Vistaril [Hydroxyzine Hcl] Itching    Patient Measurements: Height: '5\' 6"'$  (167.6 cm) Weight: 263 lb 14.3 oz (119.7 kg) IBW/kg (Calculated) : 59.3  Vital Signs: Temp: 98 F (36.7 C) (12/19 0655) Temp Source: Rectal (12/19 0429) BP: 141/56 mmHg (12/19 0655) Pulse Rate: 92 (12/19 0655) Intake/Output from previous day:   Intake/Output from this shift: Total I/O In: 360 [P.O.:360] Out: -   Labs:  Recent Labs  04/30/15 0358  WBC 8.3  HGB 13.0  PLT 291  CREATININE 1.24*   Estimated Creatinine Clearance: 59.6 mL/min (by C-G formula based on Cr of 1.24). No results for input(s): VANCOTROUGH, VANCOPEAK, VANCORANDOM, GENTTROUGH, GENTPEAK, GENTRANDOM, TOBRATROUGH, TOBRAPEAK, TOBRARND, AMIKACINPEAK, AMIKACINTROU, AMIKACIN in the last 72 hours.   Microbiology: No results found for this or any previous visit (from the past 720 hour(s)).  Medical History: Past Medical History  Diagnosis Date  .  Hypertension   . Diabetes mellitus without complication ([place])   . COPD (chronic obstructive pulmonary disease) ([place])   . CHF (congestive heart failure) ([facility])     Diastolic  . Anxiety   . Chronic back pain   . Hypothyroidism   . Pedal edema   . On home O2     2.5 L N/C prn  . Degenerative disk disease     Medications:  Scheduled:  . aspirin EC  81 mg Oral Daily  . diltiazem  240 mg Oral Daily  . enoxaparin (LOVENOX) injection  40 mg Subcutaneous Q24H  . furosemide  20 mg Intravenous Once  . gabapentin  1,200 mg Oral BID  . guaiFENesin  600 mg Oral BID  . insulin aspart  0-5 Units Subcutaneous QHS  . insulin aspart  0-9 Units Subcutaneous TID WC  . insulin detemir  40 Units Subcutaneous QHS  . ipratropium-albuterol  3 mL Nebulization Q6H  . levofloxacin (LEVAQUIN) IV  500 mg Intravenous Once  . levothyroxine  250 mcg Oral QAC breakfast  . methylPREDNISolone (SOLU-MEDROL) injection  60 mg Intravenous Q6H  . nicotine  7 mg Transdermal Daily  . pantoprazole  40 mg Oral BID  . potassium chloride  20 mEq Oral BID  . tiZANidine  4 mg Oral QID   Assessment: 64 yo female with hx of COPD seen in the Ed for increased shortness of breath and left-sided chest pain. Pt reports fever but is afebrile in the ED. Pt will be admitted for exacerbation of COPD and bronchitis. Antibiotic tx with Levaquin  Goal of Therapy:  resolution of bronchitis  Plan:  Levaquin '500mg'$  IV daily Follow up culture results  Monitor  V/S and labs  Isac Sarna, BS Pharm D, BCPS Clinical Pharmacist Pager [phone] 04/30/2015,9:43 AM

## 2015-04-30 NOTE — Progress Notes (Signed)
Results for ROSEA, DORY (MRN [id number]) as of 04/30/2015 15:13  Ref. Range 04/30/2015 07:10 04/30/2015 12:02  Glucose-Capillary Latest Ref Range: 65-99 mg/dL 235 (H)  466 (H)   Noted consult for uncontrolled diabetes. In reviewing the chart, noted patient takes: Levemir 40 units QHS, Novolog 20 units QAM, and Novolog 20 units QHS for glycemic control. Currently patient is ordered: Levemir 50 units QHS, Novolog 0-9 units TID with meals, Novolog 0-5 units HS, Novolog 6 units TID. Patient was admitted with COPD exacerbation and was given Solumedrol 125 mg at 4:42 am and Solumedrol 60 mg at 10:36 am on 04/30/15. Glucose up to 466 mg/dl at 12:02. Agree with current insulin regimen ordered. Will continue to follow and make further recommendations as more data is collected.  Thanks, Barnie Alderman, RN, MSN, CDE Diabetes Coordinator Inpatient Diabetes Program [phone] (Team Pager from [place] to [place]) [phone] ([facility] office) [phone] [facility] office) [phone] [facility] office)

## 2015-04-30 NOTE — Care Management Obs Status (Signed)
[place] NOTIFICATION   Patient Details  Name: Stephanie Combs MRN: [id number] Date of Birth: 1950/09/16   Medicare Observation Status Notification Given:  Yes    Joylene Draft, RN 04/30/2015, 4:12 PM

## 2015-04-30 NOTE — Progress Notes (Signed)
Blood sugar 499, K.Berneice Heinrich, NP notified, orders placed for BMET.

## 2015-05-01 ENCOUNTER — Telehealth (HOSPITAL_COMMUNITY): Payer: Self-pay | Admitting: Physical Therapy

## 2015-05-01 DIAGNOSIS — N179 Acute kidney failure, unspecified: Secondary | ICD-10-CM

## 2015-05-01 DIAGNOSIS — E1165 Type 2 diabetes mellitus with hyperglycemia: Secondary | ICD-10-CM

## 2015-05-01 DIAGNOSIS — E1122 Type 2 diabetes mellitus with diabetic chronic kidney disease: Secondary | ICD-10-CM

## 2015-05-01 DIAGNOSIS — N183 Chronic kidney disease, stage 3 unspecified: Secondary | ICD-10-CM

## 2015-05-01 DIAGNOSIS — E871 Hypo-osmolality and hyponatremia: Secondary | ICD-10-CM | POA: Diagnosis not present

## 2015-05-01 DIAGNOSIS — R609 Edema, unspecified: Secondary | ICD-10-CM | POA: Diagnosis not present

## 2015-05-01 DIAGNOSIS — E0865 Diabetes mellitus due to underlying condition with hyperglycemia: Secondary | ICD-10-CM | POA: Diagnosis not present

## 2015-05-01 DIAGNOSIS — IMO0002 Reserved for concepts with insufficient information to code with codable children: Secondary | ICD-10-CM

## 2015-05-01 DIAGNOSIS — N189 Chronic kidney disease, unspecified: Secondary | ICD-10-CM

## 2015-05-01 DIAGNOSIS — J441 Chronic obstructive pulmonary disease with (acute) exacerbation: Secondary | ICD-10-CM | POA: Diagnosis not present

## 2015-05-01 DIAGNOSIS — E039 Hypothyroidism, unspecified: Secondary | ICD-10-CM | POA: Diagnosis not present

## 2015-05-01 LAB — COMPREHENSIVE METABOLIC PANEL
ALT: 10 U/L — ABNORMAL LOW (ref 14–54)
AST: 11 U/L — ABNORMAL LOW (ref 15–41)
Albumin: 3.2 g/dL — ABNORMAL LOW (ref 3.5–5.0)
Alkaline Phosphatase: 85 U/L (ref 38–126)
Anion gap: 9 (ref 5–15)
BUN: 28 mg/dL — ABNORMAL HIGH (ref 6–20)
CO2: 23 mmol/L (ref 22–32)
Calcium: 8.2 mg/dL — ABNORMAL LOW (ref 8.9–10.3)
Chloride: 96 mmol/L — ABNORMAL LOW (ref 101–111)
Creatinine, Ser: 1.44 mg/dL — ABNORMAL HIGH (ref 0.44–1.00)
GFR calc Af Amer: 43 mL/min — ABNORMAL LOW (ref >60)
GFR calc non Af Amer: 37 mL/min — ABNORMAL LOW (ref >60)
Glucose, Bld: 355 mg/dL — ABNORMAL HIGH (ref 65–99)
Potassium: 4.6 mmol/L (ref 3.5–5.1)
Sodium: 128 mmol/L — ABNORMAL LOW (ref 135–145)
Total Bilirubin: 0.5 mg/dL (ref 0.3–1.2)
Total Protein: 6.2 g/dL — ABNORMAL LOW (ref 6.5–8.1)

## 2015-05-01 LAB — VITAMIN B12: Vitamin B-12: 222 pg/mL (ref 180–914)

## 2015-05-01 LAB — GLUCOSE, CAPILLARY
Glucose-Capillary: 343 mg/dL — ABNORMAL HIGH (ref 65–99)
Glucose-Capillary: 298 mg/dL — ABNORMAL HIGH (ref 65–99)
Glucose-Capillary: 289 mg/dL — ABNORMAL HIGH (ref 65–99)
Glucose-Capillary: 241 mg/dL — ABNORMAL HIGH (ref 65–99)
Glucose-Capillary: 227 mg/dL — ABNORMAL HIGH (ref 65–99)

## 2015-05-01 LAB — CBC
HCT: 33.1 % — ABNORMAL LOW (ref 36.0–46.0)
Hemoglobin: 10.6 g/dL — ABNORMAL LOW (ref 12.0–15.0)
MCH: 26 pg (ref 26.0–34.0)
MCHC: 32 g/dL (ref 30.0–36.0)
MCV: 81.1 fL (ref 78.0–100.0)
Platelets: 262 10*3/uL (ref 150–400)
RBC: 4.08 MIL/uL (ref 3.87–5.11)
RDW: 15.9 % — ABNORMAL HIGH (ref 11.5–15.5)
WBC: 8.8 10*3/uL (ref 4.0–10.5)

## 2015-05-01 LAB — HEMOGLOBIN A1C
Hgb A1c MFr Bld: 8.9 % — ABNORMAL HIGH (ref 4.8–5.6)
Mean Plasma Glucose: 209 mg/dL

## 2015-05-01 LAB — TSH: TSH: 0.104 u[IU]/mL — ABNORMAL LOW (ref 0.350–4.500)

## 2015-05-01 MED ORDER — PREDNISONE 20 MG PO TABS
40.0000 mg | ORAL_TABLET | Freq: Every day | ORAL | Status: DC
  Administered 2015-05-01 – 2015-05-02 (×2): 40 mg via ORAL
  Filled 2015-05-01 (×2): qty 2

## 2015-05-01 MED ORDER — BISACODYL 10 MG RE SUPP
10.0000 mg | Freq: Once | RECTAL | Status: AC
  Administered 2015-05-01: 10 mg via RECTAL
  Filled 2015-05-01: qty 1

## 2015-05-01 MED ORDER — SODIUM CHLORIDE 0.9 % IV BOLUS (SEPSIS)
1000.0000 mL | Freq: Once | INTRAVENOUS | Status: AC
  Administered 2015-05-01: 1000 mL via INTRAVENOUS

## 2015-05-01 MED ORDER — INSULIN ASPART 100 UNIT/ML ~~LOC~~ SOLN
12.0000 [IU] | Freq: Three times a day (TID) | SUBCUTANEOUS | Status: DC
  Administered 2015-05-01 – 2015-05-02 (×5): 12 [IU] via SUBCUTANEOUS

## 2015-05-01 MED ORDER — INSULIN DETEMIR 100 UNIT/ML ~~LOC~~ SOLN
60.0000 [IU] | Freq: Every day | SUBCUTANEOUS | Status: DC
  Administered 2015-05-01: 60 [IU] via SUBCUTANEOUS
  Filled 2015-05-01 (×2): qty 0.6

## 2015-05-01 NOTE — Telephone Encounter (Signed)
Called pt to schedule apptment for Right Big Toe Wound. Could not leave a message called her friend Louie Casa and he stated she was in [facility] ED right now. 05/01/15 NF

## 2015-05-01 NOTE — Progress Notes (Signed)
[facility] PROGRESS NOTE  Stephanie Combs HUD:[id number] DOB: Sep 12, 1950 DOA: 04/30/2015 PCP: Neale Burly, MD  Brief narrative 64 year old obese female with history of uncontrolled type II mellitus, COPD on home O2, diastolic CHF, anxiety, chronic back pain, hypothyroidism, hypertension, degenerative disc disease who presented to the hospital with left-sided chest pain shortness of breath and coughing for the past 2 days. She reported having left-sided chest pressure worsened with respiration and associated with nonproductive cough. She also has noticed increased swelling in her legs (R>>L). She has been on Lasix the dose of which was reduced by her PCP recently. Since then her swelling has gotten worse and she even have Doppler of her right leg done one month back which was negative for DVT. She also reports a fever of upto 104 Fahrenheit at home 2-3 days back. Patient admitted for acute COPD exacerbation. Hospital course prolonged due to severe hyperglycemia.  Assessment/Plan: Acute exacerbation of COPD. On scheduled Solu-Medrol, DuoNeb and empiric Levaquin. Continue O2 via nasal cannula. Flu PCR negative. Times are mild. Switch so the Medrol to oral prednisone.-Supportive care with Tylenol and antitussives. - smokes about 6 or 7 cigarettes a day and counseled strongly on cessation. Ordered nicotine patch and needs prescription upon discharge.  Uncontrolled type 2 diabetes mellitus with peripheral neuropathy Patient on 40 units of Levemir at home with sliding scale insulin.. Reports her blood glucose to be stable in the range of 150-200s at home. A1c is 8.9. Patient had persistent hyperglycemia ranging from 350-550 on 12/19. Possibly worsened by IV Solu-Medrol. Increase her Levemir to 50 units and added female aspart 6 units 3 times a day with minimal improvement.  Blood glucose worsened with IV Solu-Medrol. Have increased Levemir dose to 50 units and added pre-meal aspart 6 units 3  times a day with minimal improvement. I have increased her Levemir further to 60 units daily and pre-meal aspart to 12 units 3 times a day before meals. Appreciate diabetic coordinator follow-up. UA negative for proteinuria. - Hyponatremia and acute on chronic kidney injury stage II Seen on subsequent labs most likely due to dehydration with hyperglycemia and Lasix use. Have stopped Lasix and ordered 1 L IV fluid bolus. Patient appears dry clinically. Even her history of diastolic CHF will monitor on IV fluid use. Avoid nephrotoxins. Monitor in a.m. labs.  Left-sided chest pain Appears to be pleuritic associated with COPD exacerbation and? Acute Bronchitis Resolved at this time. Serial  troponin negative. KG unremarkable. Would not pursue further workup.  Worsened bilateral leg edema R>L No signs of acute CHF exacerbation. Her right leg is definitely more swollen than the left although no pitting edema observed. However patient insists that she has developed fluid all over. Pt  is actually hypovolemic..  check CT of abdomen and pelvis to rule out any into abdominal and pelvis was unremarkable for any venous compression. -Doppler of the extremity done last month was negative for DVT.   Morbid obesity Counseled on diet and exercise to lose weight.  Hypothyroidism Continue Synthroid. Check TSH  GERD Continue PPI  Chronic back pain Reports taking Percocet at home which has been resumed. Continue Zanaflex.  Anxiety Resumed her Xanax.  Essential hypertension Continue Cardizem.  -order to Discontinue foley catheter.     DVT prophylaxis: Subcutaneous Lovenox Diet: Diabetic  Code Status: DO NOT RESUSCITATE Family Communication: None at bedside Disposition Plan: Home tomorrow if blood glucose better controlled, hyponatremia and AKI improved   Consultants:  None  Procedures:  CT abdomen  and pelvis ordered  Antibiotics: Levaquin   HPI/Subjective: Seen and examined. Had  persistent hyperglycemia yesterday. Labs showing hyponatremia and AKI. Reports breathing to be better and patient tearful for being ill several times this year  Objective: Filed Vitals:   04/30/15 2055 05/01/15 0524  BP: 121/56 115/71  Pulse: 67 71  Temp: 98 F (36.7 C) 97.8 F (36.6 C)  Resp: 16 16    Intake/Output Summary (Last 24 hours) at 05/01/15 0847 Last data filed at 05/01/15 9678  Gross per 24 hour  Intake 2587.67 ml  Output   2750 ml  Net -162.33 ml   Filed Weights   04/30/15 0700  Weight: 119.7 kg (263 lb 14.3 oz)    Exam:   General:  64 obese female not in distress  HEENT: No pallor, moist oral mucosa, supple neck  Chest: Approved bilateral breath sounds, no added sounds  CVS: Normal S1 and S2, no murmurs rub or gallop  GI: Soft, nondistended, nontender, obese  Musculoskeletal: Warm, bilateral leg swellings (R >L), foley in place.    Data Reviewed: Basic Metabolic Panel:  Recent Labs Lab 04/30/15 0358 04/30/15 2130 05/01/15 0549  NA 139 128* 128*  K 4.5 5.0 4.6  CL 107 96* 96*  CO2 25 21* 23  GLUCOSE 167* 492* 355*  BUN 21* 26* 28*  CREATININE 1.24* 1.63* 1.44*  CALCIUM 8.9 8.3* 8.2*   Liver Function Tests:  Recent Labs Lab 05/01/15 0549  AST 11*  ALT 10*  ALKPHOS 85  BILITOT 0.5  PROT 6.2*  ALBUMIN 3.2*   No results for input(s): LIPASE, AMYLASE in the last 168 hours. No results for input(s): AMMONIA in the last 168 hours. CBC:  Recent Labs Lab 04/30/15 0358 05/01/15 0549  WBC 8.3 8.8  NEUTROABS 4.0  --   HGB 13.0 10.6*  HCT 40.6 33.1*  MCV 82.7 81.1  PLT 291 262   Cardiac Enzymes:  Recent Labs Lab 04/30/15 0358 04/30/15 0720 04/30/15 1317 04/30/15 1859  TROPONINI <0.03 <0.03 <0.03 <0.03   BNP (last 3 results)  Recent Labs  12/04/14 0456 03/24/15 1538 04/30/15 0358  BNP 50.0 170.0* 37.0    ProBNP (last 3 results) No results for input(s): PROBNP in the last 8760 hours.  CBG:  Recent  Labs Lab 04/30/15 1618 04/30/15 1839 04/30/15 2052 05/01/15 0115 05/01/15 0755  GLUCAP 520* 526* 499* 343* 298*    No results found for this or any previous visit (from the past 240 hour(s)).   Studies: Ct Abdomen Pelvis Wo Contrast  04/30/2015  CLINICAL DATA:  Inpatient. Right lower extremity swelling. CHF. COPD. EXAM: CT ABDOMEN AND PELVIS WITHOUT CONTRAST TECHNIQUE: Multidetector CT imaging of the abdomen and pelvis was performed following the standard protocol without IV contrast. COMPARISON:  Report from 11/14/2001 CT abdomen/ pelvis (images not available). 07/27/2008 chest CT angiogram. 04/12/2006 abdominal sonogram. FINDINGS: Lower chest: No significant pulmonary nodules or acute consolidative airspace disease. Left anterior descending coronary atherosclerosis. Hepatobiliary: Mild heterogeneous hepatic steatosis. No liver mass. Normal gallbladder with no radiopaque cholelithiasis. No biliary ductal dilatation. Pancreas: Normal, with no mass or duct dilation. Spleen: Normal size. No mass. Adrenals/Urinary Tract: Normal right adrenal. Left adrenal 2.1 cm adenoma (series 3/ image 31). No hydronephrosis. Punctate nonobstructing 1-2 mm stones in the interpolar right kidney. Nonobstructing 2 mm stone in the upper left kidney. Simple parapelvic renal cyst in the lower left renal sinus. No additional contour deforming renal masses. Normal bladder. Stomach/Bowel: Grossly normal stomach. Normal caliber small bowel  with no small bowel wall thickening. Appendix is not discretely visualized. Normal large bowel with no diverticulosis, large bowel wall thickening or pericolonic fat stranding. Oral contrast progresses to the distal colon. Vascular/Lymphatic: Atherosclerotic nonaneurysmal abdominal aorta. No pathologically enlarged lymph nodes in the abdomen or pelvis. Reproductive: Grossly normal uterus.  No adnexal mass. Other: No pneumoperitoneum, ascites or focal fluid collection. Musculoskeletal: No  aggressive appearing focal osseous lesions. Sclerotic focus in the T10 vertebral body is stable since 07/27/2008, in keeping with a benign etiology. Severe degenerative disc disease at L4-5 with mild degenerative disc disease throughout the remaining visualized thoracolumbar spine. IMPRESSION: 1. No noncontrast CT findings in the abdomen or pelvis to explain the lower extremity edema. No abdominopelvic lymphadenopathy. 2. No acute abnormality. No evidence of bowel obstruction or acute bowel inflammation. 3. Nonobstructing tiny stones in both kidneys.  No hydronephrosis. 4. Mild heterogeneous hepatic steatosis. 5. Coronary atherosclerosis. Electronically Signed   By: Ilona Sorrel M.D.   On: 04/30/2015 13:13   Dg Chest Port 1 View  04/30/2015  CLINICAL DATA:  Shortness of breath EXAM: PORTABLE CHEST 1 VIEW COMPARISON:  02/22/2015 FINDINGS: Haziness of the lower chest is likely from soft tissue attenuation. There is no edema, convincing consolidation, effusion, or pneumothorax. No cardiomegaly or aortic contour change when accounting for rotation and portable technique. IMPRESSION: Limited portable chest without acute finding. Electronically Signed   By: Monte Fantasia M.D.   On: 04/30/2015 04:48    Scheduled Meds: . aspirin EC  81 mg Oral Daily  . diltiazem  240 mg Oral Daily  . enoxaparin (LOVENOX) injection  40 mg Subcutaneous Q24H  . gabapentin  1,200 mg Oral BID  . guaiFENesin  600 mg Oral BID  . insulin aspart  0-5 Units Subcutaneous QHS  . insulin aspart  0-9 Units Subcutaneous TID WC  . insulin aspart  12 Units Subcutaneous TID WC  . insulin detemir  60 Units Subcutaneous QHS  . ipratropium-albuterol  3 mL Nebulization Q6H  . levofloxacin (LEVAQUIN) IV  500 mg Intravenous Q24H  . levothyroxine  250 mcg Oral QAC breakfast  . nicotine  7 mg Transdermal Daily  . pantoprazole  40 mg Oral BID  . predniSONE  40 mg Oral Q breakfast  . sodium chloride  1,000 mL Intravenous Once  . tiZANidine  4  mg Oral QID   Continuous Infusions: . sodium chloride 10 mL/hr at 05/01/15 0521      Time spent: 25 minutes    Lauralynn Loeb  [facility] Pager [phone]. If 7PM-7AM, please contact night-coverage at [email or web address], password [email or web address] 05/01/2015, 8:47 AM

## 2015-05-01 NOTE — Progress Notes (Signed)
**Note De-Identified  Obfuscation** Per RT protocol no changes made to treatment schedule at this time. BBS exp. Wheeze.   RRT to continue to monitor

## 2015-05-02 DIAGNOSIS — J441 Chronic obstructive pulmonary disease with (acute) exacerbation: Secondary | ICD-10-CM | POA: Diagnosis not present

## 2015-05-02 DIAGNOSIS — N182 Chronic kidney disease, stage 2 (mild): Secondary | ICD-10-CM

## 2015-05-02 DIAGNOSIS — N179 Acute kidney failure, unspecified: Secondary | ICD-10-CM | POA: Diagnosis not present

## 2015-05-02 DIAGNOSIS — I5032 Chronic diastolic (congestive) heart failure: Secondary | ICD-10-CM | POA: Diagnosis not present

## 2015-05-02 DIAGNOSIS — L8990 Pressure ulcer of unspecified site, unspecified stage: Secondary | ICD-10-CM | POA: Insufficient documentation

## 2015-05-02 DIAGNOSIS — E1122 Type 2 diabetes mellitus with diabetic chronic kidney disease: Secondary | ICD-10-CM | POA: Diagnosis not present

## 2015-05-02 LAB — BASIC METABOLIC PANEL
Anion gap: 8 (ref 5–15)
BUN: 33 mg/dL — ABNORMAL HIGH (ref 6–20)
CO2: 25 mmol/L (ref 22–32)
Calcium: 8.6 mg/dL — ABNORMAL LOW (ref 8.9–10.3)
Chloride: 99 mmol/L — ABNORMAL LOW (ref 101–111)
Creatinine, Ser: 1.45 mg/dL — ABNORMAL HIGH (ref 0.44–1.00)
GFR calc Af Amer: 43 mL/min — ABNORMAL LOW (ref >60)
GFR calc non Af Amer: 37 mL/min — ABNORMAL LOW (ref >60)
Glucose, Bld: 178 mg/dL — ABNORMAL HIGH (ref 65–99)
Potassium: 4.3 mmol/L (ref 3.5–5.1)
Sodium: 132 mmol/L — ABNORMAL LOW (ref 135–145)

## 2015-05-02 LAB — GLUCOSE, CAPILLARY
Glucose-Capillary: 157 mg/dL — ABNORMAL HIGH (ref 65–99)
Glucose-Capillary: 153 mg/dL — ABNORMAL HIGH (ref 65–99)

## 2015-05-02 MED ORDER — NICOTINE 7 MG/24HR TD PT24: 7.0000 mg | MEDICATED_PATCH | Freq: Every day | TRANSDERMAL | Status: DC

## 2015-05-02 MED ORDER — INSULIN ASPART 100 UNIT/ML ~~LOC~~ SOLN: 12.0000 [IU] | Freq: Three times a day (TID) | SUBCUTANEOUS | Status: DC

## 2015-05-02 MED ORDER — HYDROCODONE-HOMATROPINE 5-1.5 MG/5ML PO SYRP: 5.0000 mL | ORAL_SOLUTION | Freq: Four times a day (QID) | ORAL | Status: DC | PRN

## 2015-05-02 MED ORDER — MICONAZOLE NITRATE POWD
Freq: Four times a day (QID) | Status: DC
  Administered 2015-05-02: 10:00:00 via TOPICAL

## 2015-05-02 MED ORDER — LEVOFLOXACIN 500 MG PO TABS: 500.0000 mg | ORAL_TABLET | Freq: Every day | ORAL | Status: DC

## 2015-05-02 MED ORDER — GABAPENTIN 400 MG PO CAPS: 1200.0000 mg | ORAL_CAPSULE | Freq: Two times a day (BID) | ORAL | Status: DC

## 2015-05-02 MED ORDER — PREDNISONE 20 MG PO TABS: 30.0000 mg | ORAL_TABLET | Freq: Every day | ORAL | Status: DC

## 2015-05-02 MED ORDER — OXYCODONE-ACETAMINOPHEN 5-325 MG PO TABS: 2.0000 | ORAL_TABLET | Freq: Four times a day (QID) | ORAL | Status: DC | PRN

## 2015-05-02 NOTE — Discharge Summary (Signed)
Physician Discharge Summary  Stephanie Combs NKN:[id number] DOB: 1950-07-05 DOA: 04/30/2015  PCP: Neale Burly, MD  Admit date: 04/30/2015 Discharge date: 05/02/2015  Time spent: 65mnutes  Recommendations for Outpatient Follow-up:  1. Follow up with PCP in one week.    Discharge Diagnoses:  Active Problems:   DM type 2 (diabetes mellitus, type 2) (HCC)   Hypothyroidism (acquired)   Chronic diastolic CHF (congestive heart failure) (HCC)   Peripheral edema   COPD exacerbation (HCC)   Hyponatremia   Acute[place]chronic kidney injury ([place]   Diabetes mellitus with hyperglycemia, with long-term current use of insulin (HCC)   Pressure ulcer   Discharge Condition: improved.  BS 157, and breathing without wheezing.   Diet recommendation: cardiac and carb modified diet.   Filed Weights   04/30/15 0700  Weight: 119.7 kg (263 lb 14.3 oz)    History of present illness: patient was admitted by Dr LDarrick Meigsfor SOB and chest pain on Apr 30, 2015.  As per his H and P:  " 64year old female who  has a past medical history of Hypertension; Diabetes mellitus without complication ([place]; COPD (chronic obstructive pulmonary disease) ([place]; CHF (congestive heart failure) ([place]; Anxiety; Chronic back pain; Hypothyroidism; Pedal edema; On home O2; and Degenerative disk disease. Today presents to the hospital with chief complaint of left-sided chest pain, shortness of breath coughing. Patient has a history of COPD and is chronically on 2 L of oxygen at home. Patient says for the past 2 days she started having left-sided chest pressure, with shortness of breath and dry cough. Patient also complains of worsening swelling in the lower extremities. She is chronically on Lasix, as per patient her PCP is reduced the dose of Lasix to 20 mg twice a day. Since then the leg swelling got worse. Patient also says that right leg is bigger than the left one and she had duplex ultrasound of the lower extremities done last  month which was negative for DVT. She described chest pain as a pressure radiating to the left arm and pain going towards the jaw. No history of CAD. She denies nausea vomiting, . Also complains of fever 10 65F at home. She is afebrile in the ED.Influenza panel by PCR is negative.   Hospital Course:   Patient was admitted into the hospital, and she was felt to have COPD with exacerbation and acute bronchitis.  She was started on IV Solumedrol, and was given nebs, along with IV Levaquin.  Her flu PCR was negative.  She was recommnended to stop her cigarettes, and was given Nicotine Patches.  Her breathing improved, and she said the "cough syrup" was helpful.  She was found to have severe hyperglycemia, undoubtedly was due to her IV steroids.  She was delayed in discharge, partly due to her CBG being too high.  It was in the 300-400 ranges.  She was tapered on her steroids, and her insulin coverage was increased.   On the day of discharge, her CBG was 157. With respect to her hyponatremia and CKD, her lasix was discontinued, and she was given IVF.  On the day of discharge, her Cr was 1.45.  It seems to fluctuate between 1.24 and 1.63.  With respect to her leg edema, since it was asymmetrical with R greater than L, an [place] her legs were done, and there was  No DVT.  For her hypothyroidism,  Her synthroid was continued. Her left sided CP resolved, and her troponins were negative.  She  is now ready to be discharge, and is stable for discharge.  She will be discharged on Prednisone '30mg'$  for 5 days, then stop.  She will have 5 days of Levaquin orally, and she was given 10 tablets of percocet, and 120cc of Hycodan cough syrup.  She will continue with her Insulin as before, and monitor her CBG.  She will follow up with her PCP.  Than k you and Good Day.    Discharge Exam: Filed Vitals:   05/02/15 0736 05/02/15 0910  BP:  128/56  Pulse: 64 71  Temp:    Resp: 17    Discharge Instructions   Discharge  Instructions    Diet - low sodium heart healthy    Complete by:  As directed      Increase activity slowly    Complete by:  As directed           Current Discharge Medication List    START taking these medications   Details  gabapentin (NEURONTIN) 400 MG capsule Take 3 capsules (1,200 mg total) by mouth 2 (two) times daily. Qty: 60 capsule, Refills: 1    HYDROcodone-homatropine (HYCODAN) 5-1.5 MG/5ML syrup Take 5 mLs by mouth every 6 (six) hours as needed for cough. Qty: 120 mL, Refills: 0    !! insulin aspart (NOVOLOG) 100 UNIT/ML injection Inject 12 Units into the skin 3 (three) times daily with meals. Qty: 10 mL, Refills: 11    levofloxacin (LEVAQUIN) 500 MG tablet Take 1 tablet (500 mg total) by mouth daily. Qty: 5 tablet, Refills: 0    nicotine (NICODERM CQ - DOSED IN MG/24 HR) 7 mg/24hr patch Place 1 patch (7 mg total) onto the skin daily. Qty: 28 patch, Refills: 0    oxyCODONE-acetaminophen (PERCOCET/ROXICET) 5-325 MG tablet Take 2 tablets by mouth every 6 (six) hours as needed for moderate pain. Qty: 10 tablet, Refills: 0    predniSONE (DELTASONE) 20 MG tablet Take 1.5 tablets (30 mg total) by mouth daily with breakfast. Qty: 5 tablet, Refills: 0     !! - Potential duplicate medications found. Please discuss with provider.    CONTINUE these medications which have NOT CHANGED   Details  albuterol (PROAIR HFA) 108 (90 BASE) MCG/ACT inhaler Inhale 1 puff into the lungs every 4 (four) hours as needed. For shortness of breath Qty: 1 Inhaler, Refills: 1    alprazolam (XANAX) 2 MG tablet Take 1 tablet (2 mg total) by mouth 3 (three) times daily as needed for anxiety.    aspirin EC 81 MG tablet Take 81 mg by mouth daily.    diltiazem (CARDIZEM CD) 240 MG 24 hr capsule Take 1 capsule (240 mg total) by mouth daily. Qty: 30 capsule, Refills: 2    empagliflozin (JARDIANCE) 25 MG TABS tablet Take 25 mg by mouth daily.    furosemide (LASIX) 20 MG tablet Take 2 tablets (40  mg total) by mouth 2 (two) times daily. Qty: 180 tablet, Refills: 1    gabapentin (NEURONTIN) 600 MG tablet Take 1 tablet (600 mg total) by mouth 4 (four) times daily. Qty: 60 tablet, Refills: 2    !! insulin aspart (NOVOLOG) 100 UNIT/ML injection Inject 0-20 Units into the skin 3 (three) times daily with meals. Takes 20 Units in am and 20 Units at night.  Per sliding scale    insulin detemir (LEVEMIR) 100 UNIT/ML injection Inject 0.2 mLs (20 Units total) into the skin at bedtime. Qty: 10 mL, Refills: 11    levothyroxine (SYNTHROID,  LEVOTHROID) 125 MCG tablet Take 2 tablets (250 mcg total) by mouth daily before breakfast. Qty: 30 tablet, Refills: 2    menthol-cetylpyridinium (CEPACOL) 3 MG lozenge Take 1 lozenge (3 mg total) by mouth as needed for sore throat. Qty: 100 tablet, Refills: 0    metFORMIN (GLUCOPHAGE) 500 MG tablet Take 500 mg by mouth 2 (two) times daily with a meal.    nicotine (NICODERM CQ - DOSED IN MG/24 HOURS) 21 mg/24hr patch Place 1 patch (21 mg total) onto the skin daily. Qty: 28 patch, Refills: 0    nystatin (MYCOSTATIN/NYSTOP) 100000 UNIT/GM POWD Apply 1 g topically 2 (two) times daily.    Omega-3 Fatty Acids (FISH OIL PO) Take 1 capsule by mouth 2 (two) times daily.     ondansetron (ZOFRAN ODT) 8 MG disintegrating tablet Take 1 tablet (8 mg total) by mouth every 8 (eight) hours as needed for nausea or vomiting. Qty: 10 tablet, Refills: 0    pantoprazole (PROTONIX) 40 MG tablet Take 1 tablet (40 mg total) by mouth 2 (two) times daily. Qty: 60 tablet, Refills: 0    potassium chloride (K-DUR) 10 MEQ tablet Take 20 mEq by mouth 2 (two) times daily.     saccharomyces boulardii (FLORASTOR) 250 MG capsule Take 1 capsule (250 mg total) by mouth 2 (two) times daily. Qty: 30 capsule, Refills: 0    tiZANidine (ZANAFLEX) 4 MG tablet Take 4 mg by mouth 4 (four) times daily.     !! - Potential duplicate medications found. Please discuss with provider.    STOP taking  these medications     benzonatate (TESSALON) 100 MG capsule      Bisacodyl (FLEET LAXATIVE RE)      ipratropium-albuterol (DUONEB) 0.5-2.5 (3) MG/3ML SOLN      naproxen sodium (ALEVE) 220 MG tablet        Allergies  Allergen Reactions  . Dilaudid [Hydromorphone Hcl] Itching  . Actifed Cold-Allergy [Chlorpheniramine-Phenylephrine]     "I was sick and red and it didn't agree with me at all"  . Doxycycline Nausea And Vomiting  . Other Cough    Pt states she is allergic to ragweed and that she starts coughing and sneezing like crazy  . Penicillins Hives and Itching    Has patient had a PCN reaction causing immediate rash, facial/tongue/throat swelling, SOB or lightheadedness with hypotension: Yes Has patient had a PCN reaction causing severe rash involving mucus membranes or skin necrosis: No Has patient had a PCN reaction that required hospitalization Yes Has patient had a PCN reaction occurring within the last 10 years: No If all of the above answers are "NO", then may proceed with Cephalosporin use.   . Reglan [Metoclopramide] Itching  . Valium [Diazepam] Itching  . Vistaril [Hydroxyzine Hcl] Itching      The results of significant diagnostics from this hospitalization (including imaging, microbiology, ancillary and laboratory) are listed below for reference.    Significant Diagnostic Studies: Ct Abdomen Pelvis Wo Contrast  04/30/2015  CLINICAL DATA:  Inpatient. Right lower extremity swelling. CHF. COPD. EXAM: CT ABDOMEN AND PELVIS WITHOUT CONTRAST TECHNIQUE: Multidetector CT imaging of the abdomen and pelvis was performed following the standard protocol without IV contrast. COMPARISON:  Report from 11/14/2001 CT abdomen/ pelvis (images not available). 07/27/2008 chest CT angiogram. 04/12/2006 abdominal sonogram. FINDINGS: Lower chest: No significant pulmonary nodules or acute consolidative airspace disease. Left anterior descending coronary atherosclerosis. Hepatobiliary: Mild  heterogeneous hepatic steatosis. No liver mass. Normal gallbladder with no radiopaque cholelithiasis. No  biliary ductal dilatation. Pancreas: Normal, with no mass or duct dilation. Spleen: Normal size. No mass. Adrenals/Urinary Tract: Normal right adrenal. Left adrenal 2.1 cm adenoma (series 3/ image 31). No hydronephrosis. Punctate nonobstructing 1-2 mm stones in the interpolar right kidney. Nonobstructing 2 mm stone in the upper left kidney. Simple parapelvic renal cyst in the lower left renal sinus. No additional contour deforming renal masses. Normal bladder. Stomach/Bowel: Grossly normal stomach. Normal caliber small bowel with no small bowel wall thickening. Appendix is not discretely visualized. Normal large bowel with no diverticulosis, large bowel wall thickening or pericolonic fat stranding. Oral contrast progresses to the distal colon. Vascular/Lymphatic: Atherosclerotic nonaneurysmal abdominal aorta. No pathologically enlarged lymph nodes in the abdomen or pelvis. Reproductive: Grossly normal uterus.  No adnexal mass. Other: No pneumoperitoneum, ascites or focal fluid collection. Musculoskeletal: No aggressive appearing focal osseous lesions. Sclerotic focus in the T10 vertebral body is stable since 07/27/2008, in keeping with a benign etiology. Severe degenerative disc disease at L4-5 with mild degenerative disc disease throughout the remaining visualized thoracolumbar spine. IMPRESSION: 1. No noncontrast CT findings in the abdomen or pelvis to explain the lower extremity edema. No abdominopelvic lymphadenopathy. 2. No acute abnormality. No evidence of bowel obstruction or acute bowel inflammation. 3. Nonobstructing tiny stones in both kidneys.  No hydronephrosis. 4. Mild heterogeneous hepatic steatosis. 5. Coronary atherosclerosis. Electronically Signed   By: Ilona Sorrel M.D.   On: 04/30/2015 13:13   Dg Chest Port 1 View  04/30/2015  CLINICAL DATA:  Shortness of breath EXAM: PORTABLE CHEST 1 VIEW  COMPARISON:  02/22/2015 FINDINGS: Haziness of the lower chest is likely from soft tissue attenuation. There is no edema, convincing consolidation, effusion, or pneumothorax. No cardiomegaly or aortic contour change when accounting for rotation and portable technique. IMPRESSION: Limited portable chest without acute finding. Electronically Signed   By: Monte Fantasia M.D.   On: 04/30/2015 04:48    Microbiology: No results found for this or any previous visit (from the past 240 hour(s)).   Labs: Basic Metabolic Panel:  Recent Labs Lab 04/30/15 0358 04/30/15 2130 05/01/15 0549 05/02/15 0450  NA 139 128* 128* 132*  K 4.5 5.0 4.6 4.3  CL 107 96* 96* 99*  CO2 25 21* 23 25  GLUCOSE 167* 492* 355* 178*  BUN 21* 26* 28* 33*  CREATININE 1.24* 1.63* 1.44* 1.45*  CALCIUM 8.9 8.3* 8.2* 8.6*   Liver Function Tests:  Recent Labs Lab 05/01/15 0549  AST 11*  ALT 10*  ALKPHOS 85  BILITOT 0.5  PROT 6.2*  ALBUMIN 3.2*   No results for input(s): LIPASE, AMYLASE in the last 168 hours. No results for input(s): AMMONIA in the last 168 hours. CBC:  Recent Labs Lab 04/30/15 0358 05/01/15 0549  WBC 8.3 8.8  NEUTROABS 4.0  --   HGB 13.0 10.6*  HCT 40.6 33.1*  MCV 82.7 81.1  PLT 291 262   Cardiac Enzymes:  Recent Labs Lab 04/30/15 0358 04/30/15 0720 04/30/15 1317 04/30/15 1859  TROPONINI <0.03 <0.03 <0.03 <0.03    Recent Labs  12/04/14 0456 03/24/15 1538 04/30/15 0358  BNP 50.0 170.0* 37.0   CBG:  Recent Labs Lab 05/01/15 1143 05/01/15 1642 05/01/15 2108 05/02/15 0759 05/02/15 1128  GLUCAP 289* 241* 227* 157* 153*     Signed:  Latrecia Capito MD  FACP  [facility] 05/02/2015, 1:26 PM

## 2015-05-02 NOTE — Care Management Note (Signed)
Case Management Note  Patient Details  Name: Stephanie Combs MRN: [id number] Date of Birth: 07-30-1950  Subjective/Objective:                    Action/Plan:   Expected Discharge Date:                  Expected Discharge Plan:  Home/Self Care  In-House Referral:  NA  Discharge planning Services  CM Consult  Post Acute Care Choice:  NA Choice offered to:  NA  DME Arranged:    DME Agency:     HH Arranged:    HH Agency:     Status of Service:  Completed, signed off  Medicare Important Message Given:    Date Medicare IM Given:    Medicare IM give by:    Date Additional Medicare IM Given:    Additional Medicare Important Message give by:     If discussed at [place] of Stay Meetings, dates discussed:    Additional Comments: Pt discharged home today. No CM needs noted. Christinia Gully [place], RN 05/02/2015, 2:12 PM

## 2015-05-02 NOTE — Progress Notes (Signed)
RN notified Dr. Marin Comment with questions about patient's AVS.  Dr. Marin Comment changed order for Gabapentin, stated that patient should take the Nicotine patch, and follow the insulin orders as prescribed and will need to be adjusted after steroids are complete.  Patient verbalized understanding of discharge instructions, physician follow-up, and medications.  Prescriptions for cough syrup and pain medication given to patient.  Patient's IV removed.  Site WNL.  Patient transported by Nurse Tech via wheelchair to main entrance for discharge.  Patient's family providing transportation home.  Patient stable at time of discharge.

## 2015-05-10 ENCOUNTER — Ambulatory Visit (HOSPITAL_COMMUNITY): Payer: Medicare Other | Admitting: Physical Therapy

## 2015-05-15 ENCOUNTER — Telehealth (HOSPITAL_COMMUNITY): Payer: Self-pay | Admitting: Physical Therapy

## 2015-05-15 NOTE — Telephone Encounter (Signed)
Attempted to call patient to see if she would move to 8:45am appointment tomorrow however did not answer phone; did not leave message as did not want to put patient in position of not being able to call clinic back after hours regarding potentially moving to early appointment in am. Patient currently remains scheduled for 11:45am appointment.  Deniece Ree PT, DPT [phone]

## 2015-05-16 ENCOUNTER — Ambulatory Visit (HOSPITAL_COMMUNITY): Payer: Medicare Other | Attending: Internal Medicine | Admitting: Physical Therapy

## 2015-05-16 DIAGNOSIS — I890 Lymphedema, not elsewhere classified: Secondary | ICD-10-CM | POA: Diagnosis not present

## 2015-05-16 DIAGNOSIS — T148 Other injury of unspecified body region: Secondary | ICD-10-CM | POA: Insufficient documentation

## 2015-05-16 DIAGNOSIS — R29898 Other symptoms and signs involving the musculoskeletal system: Secondary | ICD-10-CM | POA: Insufficient documentation

## 2015-05-16 DIAGNOSIS — R269 Unspecified abnormalities of gait and mobility: Secondary | ICD-10-CM | POA: Diagnosis not present

## 2015-05-16 DIAGNOSIS — X58XXXA Exposure to other specified factors, initial encounter: Secondary | ICD-10-CM | POA: Insufficient documentation

## 2015-05-16 DIAGNOSIS — R262 Difficulty in walking, not elsewhere classified: Secondary | ICD-10-CM | POA: Insufficient documentation

## 2015-05-16 DIAGNOSIS — T148XXA Other injury of unspecified body region, initial encounter: Secondary | ICD-10-CM

## 2015-05-16 NOTE — Therapy (Signed)
[facility] [place], [name], [address] Phone: [phone]   Fax:  [phone]  Physical Therapy Evaluation  Patient Details  Name: Stephanie Combs MRN: [id number] Date of Birth: 08/19/1950 No Data Recorded  Encounter Date: 05/16/2015      PT End of Session - 05/16/15 1656    Visit Number 1   Number of Visits 12   Date for PT Re-Evaluation 06/15/15   Authorization Type [employer] medicare   Authorization - Visit Number 1   Authorization - Number of Visits 10   PT Start Time 8756   PT Stop Time 1255   PT Time Calculation (min) 70 min   Activity Tolerance Patient tolerated treatment well   Behavior During Therapy [facility] for tasks assessed/performed      Past Medical History  Diagnosis Date  . Hypertension   . Diabetes mellitus without complication ([place])   . COPD (chronic obstructive pulmonary disease) ([place])   . CHF (congestive heart failure) ([facility])     Diastolic  . Anxiety   . Chronic back pain   . Hypothyroidism   . Pedal edema   . On home O2     2.5 L N/C prn  . Degenerative disk disease     Past Surgical History  Procedure Laterality Date  . Tubal ligation    . Hernia repair      There were no vitals filed for this visit.  Visit Diagnosis:  Nonhealing nonsurgical wound  Lymphedema  Leg heaviness  Difficulty walking  Abnormal gait     Date 05/16/2015 05/16/2015   right left  MTP 30.00 29.20  ankle 32 31.5  4cm 41.20 34.50  8cm 47.00 42.00  12 cm 53.00 54.30  16cm 55.50 55.20  20cm 56.50 54.00  24cm 54.20 47.00  28cm    32cm    36cm    40cm    44cm    48cm    52cm    56cm    58cm    60cm        Sum of squares 17849.58 15919.67  Total Volume 5681.7 4332.9518                    Wound Therapy - 05/16/15 1632    Subjective Stephanie Combs states that she has been in the hospital almost every month for the past year with cellulitis.  She states she is tired and wants to find out  what can heal her wound.    Patient and Family Stated Goals Legs swelling to go down as well as wound to heal.  To be able to get around again    Date of Onset 08/11/14   Prior Treatments hospitalized, various antibiotics, self care.    Pain Assessment 0-10   Pain Score 6    Pain Type Chronic pain   Pain Location Toe (Comment which one)  Rt great    Pain Descriptors / Indicators Discomfort   Pain Onset On-going   Patients Stated Pain Goal 0   Pain Intervention(s) Repositioned   Evaluation and Treatment Procedures Explained to Patient/Family Yes   Evaluation and Treatment Procedures agreed to   Pressure Ulcer Properties Date First Assessed: 04/30/15 Time First Assessed: 0825 Location: Foot Location Orientation: Posterior , right great toe.  Staging: Unstageable - Full thickness tissue loss in which the base of the ulcer is covered by slough (yellow, tan, gray, green or brown) and/or eschar (tan, brown or black) in  the wound bed. Wound Description (Comments): black dry skin on the toe Present on Admission: Yes Final Assessment Date: 05/16/15 Final Assessment Time: 1636   Wound Properties Date First Assessed: 11/07/14 Location: Toe (Comment  which one) , right great toe  Location Orientation: Right Wound Description (Comments): small open hole under the right grest toe   Dressing Type Gauze (Comment)   Dressing Changed Changed   Dressing Status Clean;Old drainage   Dressing Change Frequency PRN   Site / Wound Assessment Granulation tissue;Painful;Yellow   % Wound base Red or Granulating 75%   % Wound base Yellow 25%   Peri-wound Assessment Edema;Induration   Wound Length (cm) 0.9 cm   Wound Width (cm) 0.6 cm   Wound Depth (cm) 0.6 cm   Undermining (cm) entire wound bed ; greatest is superiorly .5 cm    Margins Epibole (rolled edges)   Drainage Amount Moderate   Treatment Cleansed;Debridement (Selective);Other (Comment)  sliver hydrofiber2x2 ; toe wrap and profore lite.    Selective  Debridement - Location wound bed    Selective Debridement - Tools Used Forceps;Scalpel   Wound Therapy - Clinical Statement Stephanie Combs is a 65 yo female who appears to have lymphedema.  The lymphedema is the most probable cause of her cellulitis that she has been hospitalized for in the past as well as her present wound.  A referral for lymphedema treatment has been sent to her referring MD.  Her condition is also complicated by the fact that she has COPD, renal failure and CHF.  She will benefit from total decongestive techniques to B LE including manual and bandaging as well as treatment for her wound located on the plantar aspect of her Rt great toe.    Wound Therapy - Functional Problem List Live impact score 84/90(impacting life severly)   Factors Delaying/Impairing Wound Healing Altered sensation;Diabetes Mellitus;Infection - systemic/local;Immobility;Multiple medical problems;Tobacco use   Hydrotherapy Plan Debridement;Dressing change;Patient/family education;Other (comment)  begin lymphedema treatment as soon as possible   Wound Therapy - Frequency Other (comment)   Wound Therapy - Current Recommendations --  2x due to transportation issues.    Wound Therapy - Follow Up Recommendations Other (comment)   Wound Plan Pt will be seen for wound care.  If pt tolerated profore lite change to profore.  Give pt lymphedema information as well as order sheet for short stretch as pt forgot them.  Begin treatment on Lt LE as soon as order has returned    Dressing  silver hydrofiber, 2x2, toe wrap and profore/proforelite progress to foam and short stretch bandages as soon as possible.    Decrease Necrotic Tissue to STG:  2 wk 0%   Decrease Necrotic Tissue - Progress Goal set today   Increase Granulation Tissue to STG: 2 weeks 100%    Increase Granulation Tissue - Progress Goal set today   Decrease Length/Width/Depth by (cm) STG: 2 weeks decrease depth and undermining by .3cm; LTG: 6 weeks:  decease by  .7x.4.x.5   Decrease Length/Width/Depth - Progress Goal set today   Improve Drainage Characteristics --  scant   Improve Drainage Characteristics - Progress Goal set today   Patient/Family will be able to  STG: 2 weeks understanding of lymphedema and risk of cellulitis;  4 weeK;; Signs and symptoms of cellulitis    Patient/Family Instruction Goal - Progress Goal set today   Additional Wound Therapy Goal STG 3 weeks:  volume to be decreased by 20%; LTG 6 weeks volume to be decreased by  35%  6 weeks:  pt to have been fitted for a compression garment   Additional Wound Therapy Goal - Progress Goal set today   Goals/treatment plan/discharge plan were made with and agreed upon by patient/family Yes   Time For Goal Achievement --  6 weeks    Wound Therapy - Potential for Goals Fair                 PT Education - 06-Jun-2015 1655    Education provided Yes   Education Details lymphedema pt given sheets but left them here.    Person(s) Educated Patient   Methods Explanation                      G-Codes - June 06, 2015 1658    Functional Assessment Tool Used lymphedema life impact scale   Functional Limitation Other PT primary   Other PT Primary Current Status (W4097) At least 80 percent but less than 100 percent impaired, limited or restricted   Other PT Primary Goal Status (D5329) At least 60 percent but less than 80 percent impaired, limited or restricted       Problem List Patient Active Problem List   Diagnosis Date Noted  . Pressure ulcer 05/02/2015  . Hyponatremia 05/01/2015  . Acute-on-chronic kidney injury ([place]) 05/01/2015  . Diabetes mellitus with hyperglycemia, with long-term current use of insulin ([place]) 05/01/2015  . COPD exacerbation ([place]) 04/30/2015  . Cellulitis of foot, right 03/24/2015  . Elevated d-dimer 03/24/2015  . Peripheral edema 12/04/2014  . Bilateral edema of lower extremity   . Diabetic ulcer of right great toe ([place])   . Foot ulcer due to  secondary DM ([place]) 11/07/2014  . Diabetic ulcer of right foot ([place]) 11/07/2014  . Edema of lower extremity 05/27/2013  . Headache 05/26/2013  . CKD (chronic kidney disease), stage III 04/14/2013  . Leukocytosis 04/14/2013  . Anemia in CKD (chronic kidney disease) 04/14/2013  . Dyspnea 04/14/2013  . Chronic diastolic CHF (congestive heart failure) ([place])   . COPD (chronic obstructive pulmonary disease) ([place]) 08/07/2012  . Morbid obesity ([place]) 08/07/2012  . DM type 2 (diabetes mellitus, type 2) ([place]) 08/07/2012  . HTN (hypertension), benign 08/07/2012  . Hypothyroidism (acquired) 08/07/2012    Rayetta Humphrey, PT CLT [phone] June 06, 2015, 4:59 PM  [facility] [address] [place], [name], [address] Phone: [phone]   Fax:  [phone]  Name: Stephanie Combs MRN: [id number] Date of Birth: Oct 20, 1950

## 2015-05-18 ENCOUNTER — Encounter (HOSPITAL_COMMUNITY): Payer: Medicare Other | Admitting: Physical Therapy

## 2015-05-18 ENCOUNTER — Telehealth (HOSPITAL_COMMUNITY): Payer: Self-pay | Admitting: Physical Therapy

## 2015-05-18 NOTE — Telephone Encounter (Signed)
She is sick today and can not come in. NF 05/18/15

## 2015-05-23 ENCOUNTER — Ambulatory Visit (HOSPITAL_COMMUNITY): Payer: Medicare Other | Admitting: Physical Therapy

## 2015-05-23 DIAGNOSIS — R269 Unspecified abnormalities of gait and mobility: Secondary | ICD-10-CM | POA: Diagnosis not present

## 2015-05-23 DIAGNOSIS — T148XXA Other injury of unspecified body region, initial encounter: Secondary | ICD-10-CM

## 2015-05-23 DIAGNOSIS — I890 Lymphedema, not elsewhere classified: Secondary | ICD-10-CM | POA: Diagnosis not present

## 2015-05-23 DIAGNOSIS — T148 Other injury of unspecified body region: Secondary | ICD-10-CM | POA: Diagnosis not present

## 2015-05-23 DIAGNOSIS — R29898 Other symptoms and signs involving the musculoskeletal system: Secondary | ICD-10-CM

## 2015-05-23 DIAGNOSIS — R262 Difficulty in walking, not elsewhere classified: Secondary | ICD-10-CM

## 2015-05-23 NOTE — Therapy (Addendum)
[facility] [place], [name], [address] Phone: [phone]   Fax:  [phone]  Wound Care Therapy  Patient Details  Name: Stephanie Combs MRN: [id number] Date of Birth: [birth year] No Data Recorded  Encounter Date: 05/23/2015        PT End of Session - 05/25/15 1300    Visit Number 2   Number of Visits 12   Date for PT Re-Evaluation 06/15/15   Authorization - Visit Number 2   Authorization - Number of Visits 10   PT Start Time 0737   PT Stop Time 1257   PT Time Calculation (min) 70 min   Activity Tolerance Patient tolerated treatment well      Past Medical History  Diagnosis Date  . Hypertension   . Diabetes mellitus without complication ([place])   . COPD (chronic obstructive pulmonary disease) ([place])   . CHF (congestive heart failure) ([facility])     Diastolic  . Anxiety   . Chronic back pain   . Hypothyroidism   . Pedal edema   . On home O2     2.5 L N/C prn  . Degenerative disk disease     Past Surgical History  Procedure Laterality Date  . Tubal ligation    . Hernia repair      There were no vitals filed for this visit.  Visit Diagnosis:  Nonhealing nonsurgical wound  Lymphedema  Leg heaviness  Difficulty walking  Abnormal gait                 Wound Therapy - 05/23/15 1519    Subjective Stephanie Combs states that the dressing helped with her swelling but then the dressing fell down and her leg began to swell again.   Patient and Family Stated Goals Legs swelling to go down as well as wound to heal.  To be able to get around again    Date of Onset 08/11/14   Prior Treatments hospitalized, various antibiotics, self care.    Pain Assessment 0-10   Pain Score 4    Pain Type Chronic pain   Pain Location Toe (Comment which one)   Pain Descriptors / Indicators Discomfort   Pain Onset On-going   Patients Stated Pain Goal 0   Evaluation and Treatment Procedures Explained to Patient/Family Yes    Evaluation and Treatment Procedures agreed to   Wound Properties Date First Assessed: 11/07/14 Location: Toe (Comment  which one) , right great toe  Location Orientation: Right Wound Description (Comments): small open hole under the right grest toe   Dressing Type Gauze (Comment);Silver hydrofiber   Dressing Changed Changed   Dressing Status Clean;Old drainage   Dressing Change Frequency PRN   Site / Wound Assessment Granulation tissue;Painful;Yellow   % Wound base Red or Granulating 75%   % Wound base Yellow 25%   Peri-wound Assessment Edema;Induration   Wound Length (cm) 0.9 cm   Wound Width (cm) 0.6 cm   Wound Depth (cm) 0.9 cm   Undermining (cm) epiboled edges and bed of wound    Margins Epibole (rolled edges)   Drainage Amount Moderate   Treatment Cleansed;Debridement (Selective)  manual techniques hip to toe/toe to hip to decrease edema.   Selective Debridement - Location wound bed    Selective Debridement - Tools Used Forceps;Scalpel   Wound Therapy - Clinical Statement Therapist hs recieved orders for lymphedema.  Pt was given order sheet for lymphedema as well as tips and exercises to complete  to improve lymph flow.  Wound is deeper but unsure if this is from debriding or if the wound is actuallly increasing in depth at this time.  Will begin lymphedema treatments to B LE once pt has purchased short stretch bandages.    Wound Therapy - Functional Problem List Live impact score 84/90(impacting life severly)   Factors Delaying/Impairing Wound Healing Altered sensation;Diabetes Mellitus;Infection - systemic/local;Immobility;Multiple medical problems;Tobacco use   Hydrotherapy Plan Debridement;Dressing change;Patient/family education;Other (comment)  begin lymphedema treatment as soon as possible   Wound Therapy - Frequency Other (comment)   Wound Therapy - Current Recommendations --  2x due to transportation issues.    Wound Therapy - Follow Up Recommendations Other (comment)    Wound Plan Pt will be seen for wound care.  If pt tolerated profore lite change to profore.  Give pt lymphedema information as well as order sheet for short stretch as pt forgot them.  Begin treatment on Lt LE as soon as order has returned    Dressing  silver hydrofiber, 2x2, toe wrap and profore/proforelite progress to foam and short stretch bandages as soon as possible.    Decrease Necrotic Tissue to STG:  2 wk 0%   Decrease Necrotic Tissue - Progress Progressing toward goal   Increase Granulation Tissue to STG: 2 weeks 100%    Increase Granulation Tissue - Progress Progressing toward goal   Decrease Length/Width/Depth by (cm) STG: 2 weeks decrease depth and undermining by .3cm; LTG: 6 weeks:  decease by .7x.4.x.5   Decrease Length/Width/Depth - Progress Progressing toward goal   Improve Drainage Characteristics --  scant   Improve Drainage Characteristics - Progress Progressing toward goal   Patient/Family will be able to  STG: 2 weeks understanding of lymphedema and risk of cellulitis;  4 weeK;; Signs and symptoms of cellulitis    Patient/Family Instruction Goal - Progress Progressing toward goal   Additional Wound Therapy Goal STG 3 weeks:  volume to be decreased by 20%; LTG 6 weeks volume to be decreased by 35%  6 weeks:  pt to have been fitted for a compression garment   Additional Wound Therapy Goal - Progress Progressing toward goal   Goals/treatment plan/discharge plan were made with and agreed upon by patient/family Yes   Time For Goal Achievement --  6 weeks    Wound Therapy - Potential for Goals Fair                 PT Education - 05/23/15 1654    Education provided Yes   Education Details given lymphedma information as well as order sheet for short stretch bandages    Person(s) Educated Patient   Methods Explanation   Comprehension Verbalized understanding                     Problem List Patient Active Problem List   Diagnosis Date Noted  .  Pressure ulcer 05/02/2015  . Hyponatremia 05/01/2015  . Acute-on-chronic kidney injury ([place]) 05/01/2015  . Diabetes mellitus with hyperglycemia, with long-term current use of insulin ([place]) 05/01/2015  . COPD exacerbation ([place]) 04/30/2015  . Cellulitis of foot, right 03/24/2015  . Elevated d-dimer 03/24/2015  . Peripheral edema 12/04/2014  . Bilateral edema of lower extremity   . Diabetic ulcer of right great toe ([place])   . Foot ulcer due to secondary DM ([place]) 11/07/2014  . Diabetic ulcer of right foot ([place]) 11/07/2014  . Edema of lower extremity 05/27/2013  . Headache 05/26/2013  . CKD (chronic  kidney disease), stage III 04/14/2013  . Leukocytosis 04/14/2013  . Anemia in CKD (chronic kidney disease) 04/14/2013  . Dyspnea 04/14/2013  . Chronic diastolic CHF (congestive heart failure) ([place])   . COPD (chronic obstructive pulmonary disease) ([place]) 08/07/2012  . Morbid obesity ([place]) 08/07/2012  . DM type 2 (diabetes mellitus, type 2) ([place]) 08/07/2012  . HTN (hypertension), benign 08/07/2012  . Hypothyroidism (acquired) 08/07/2012    Rayetta Humphrey, PT CLT [phone] 05/23/2015, 4:57 PM  [facility] [address] [place], [name], [address] Phone: [phone]   Fax:  [phone]  Name: Stephanie Combs MRN: [id number] Date of Birth: 08-15-1950

## 2015-05-24 ENCOUNTER — Telehealth (HOSPITAL_COMMUNITY): Payer: Self-pay | Admitting: Physical Therapy

## 2015-05-24 NOTE — Telephone Encounter (Signed)
Stephanie Combs corrected the sizes that the pt needed and patient was called and she understands what she needs to buy.  2 rolls - 6 CM 2 rolls- 8 CM 6 rolls - 10 CM Patient called [name] they had the supplies but patient did not want to pay their prices. Advised patient to come in for her wound care even if she did not have the wrapping bandages, confirmed by CR.  05/24/15 NF Spoke to Stephanie Combs, went over the supplies she needs to purchase to wrap her legs. Pre Stephanie Combs 2 rolls - 4 CM 2 rolls - 6 CM 6 rolls - 8 CM Patient wrote this on the back of the order form Stephanie Combs gave her yesterday and will understands what she needs to purchase. NF 05/24/15

## 2015-05-24 NOTE — Telephone Encounter (Signed)
Amber with Dr. Stoney Bang she will fax order for Eval and tx of Lymphedema of Left leg. NF 05/24/15 5:10pm

## 2015-05-24 NOTE — Telephone Encounter (Signed)
Spoke to Stephanie Combs, went over the supplies she needs to purchase to wrap her legs. Pre [place] 2 rolls - 4 CM 2 rolls - 6 CM 6 rolls - 8 CM Patient wrote this on the back of the order form Stephanie Combs gave her yesterday and will understands what she needs to purchase. NF 05/24/15

## 2015-05-25 ENCOUNTER — Ambulatory Visit (HOSPITAL_COMMUNITY): Payer: Medicare Other | Admitting: Physical Therapy

## 2015-05-25 DIAGNOSIS — R262 Difficulty in walking, not elsewhere classified: Secondary | ICD-10-CM

## 2015-05-25 DIAGNOSIS — I890 Lymphedema, not elsewhere classified: Secondary | ICD-10-CM | POA: Diagnosis not present

## 2015-05-25 DIAGNOSIS — R269 Unspecified abnormalities of gait and mobility: Secondary | ICD-10-CM | POA: Diagnosis not present

## 2015-05-25 DIAGNOSIS — R29898 Other symptoms and signs involving the musculoskeletal system: Secondary | ICD-10-CM

## 2015-05-25 DIAGNOSIS — T148 Other injury of unspecified body region: Secondary | ICD-10-CM | POA: Diagnosis not present

## 2015-05-25 DIAGNOSIS — T148XXA Other injury of unspecified body region, initial encounter: Secondary | ICD-10-CM

## 2015-05-25 NOTE — Therapy (Signed)
[place], [name], [address] Phone: [phone]   Fax:  [phone]  Wound Care Therapy  Patient Details  Name: Stephanie Combs MRN: [id number] Date of Birth: May 07, 1951 No Data Recorded  Encounter Date: 05/25/2015      PT End of Session - 05/25/15 1300    Visit Number 3   Number of Visits 12   Date for PT Re-Evaluation 06/15/15   Authorization - Visit Number 3   Authorization - Number of Visits 10   PT Start Time 1130   PT Stop Time 1250   PT Time Calculation (min) 80 min   Activity Tolerance Patient tolerated treatment well      Past Medical History  Diagnosis Date  . Hypertension   . Diabetes mellitus without complication ([facility])   . COPD (chronic obstructive pulmonary disease) ([facility])   . CHF (congestive heart failure) ([facility])     Diastolic  . Anxiety   . Chronic back pain   . Hypothyroidism   . Pedal edema   . On home O2     2.5 L N/C prn  . Degenerative disk disease     Past Surgical History  Procedure Laterality Date  . Tubal ligation    . Hernia repair      There were no vitals filed for this visit.  Visit Diagnosis:  Nonhealing nonsurgical wound  Lymphedema  Leg heaviness  Difficulty walking  Abnormal gait                 Wound Therapy - 05/25/15 1251    Subjective Pt states that she can not afford the short stretch bandages.  Pt states that her Lt LE is getting bigger an she is feaful that she will wind up in the hospital again.  Pt states after last session she had to void all night long    Patient and Family Stated Goals Legs swelling to go down as well as wound to heal.  To be able to get around again    Date of Onset 08/11/14   Prior Treatments hospitalized, various antibiotics, self care.    Pain Assessment 0-10   Pain Score 3    Pain Type Chronic pain   Pain Location Toe (Comment which one)   Pain Descriptors / Indicators Burning   Pain Onset On-going   Patients Stated Pain Goal 0   Evaluation and Treatment Procedures Explained to Patient/Family Yes   Evaluation and Treatment Procedures agreed to   Wound Properties Date First Assessed: 11/07/14 Location: Toe (Comment  which one) , right great toe  Location Orientation: Right Wound Description (Comments): small open hole under the right grest toe   Dressing Type Gauze (Comment);Silver hydrofiber   Dressing Changed Changed   Dressing Status Clean;Old drainage   Dressing Change Frequency PRN   Site / Wound Assessment Granulation tissue;Painful;Yellow   % Wound base Red or Granulating 80%   % Wound base Yellow 20%   Peri-wound Assessment Edema;Induration   Margins Epibole (rolled edges)   Drainage Amount Minimal   Treatment Cleansed;Other (Comment)   Wound Therapy - Clinical Statement Pt is unable to afford short stretch bandages will wrap with profore and send order for compression bandage to [facility] - Functional Problem List Live impact score 84/90(impacting life severly)   Factors Delaying/Impairing Wound Healing Altered sensation;Diabetes Mellitus;Infection - systemic/local;Immobility;Multiple medical problems;Tobacco use   Hydrotherapy Plan Debridement;Dressing change;Patient/family education;Other (comment)  begin  lymphedema treatment as soon as possible   Wound Therapy - Frequency Other (comment)   Wound Therapy - Current Recommendations --  2x due to transportation issues.    Wound Therapy - Follow Up Recommendations Other (comment)   Wound Plan Pt to recieve debridement as necessary to wound followed by manual techniques to decrease edema.    Dressing  silver hydrofiber, 2x2, toe wrap and profore   Manual Therapy supraclavicular, deep and superfical abdominal followed by routing fluid using inguinal/axillary anastomosis anteriorly and LE .  Manual done on both the right and the left side.    Decrease Necrotic Tissue to STG:  2 wk 0%   Increase Granulation  Tissue to STG: 2 weeks 100%    Decrease Length/Width/Depth by (cm) STG: 2 weeks decrease depth and undermining by .3cm; LTG: 6 weeks:  decease by .7x.4.x.5   Improve Drainage Characteristics --  scant   Patient/Family will be able to  STG: 2 weeks understanding of lymphedema and risk of cellulitis;  4 weeK;; Signs and symptoms of cellulitis    Additional Wound Therapy Goal STG 3 weeks:  volume to be decreased by 20%; LTG 6 weeks volume to be decreased by 35%  6 weeks:  pt to have been fitted for a compression garment   Goals/treatment plan/discharge plan were made with and agreed upon by patient/family Yes   Time For Goal Achievement --  6 weeks    Wound Therapy - Potential for Goals Fair                 PT Education - 05/25/15 1259    Education Details yes to takel layers off of bandages if they become to tight.   Person(s) Educated Patient   Methods Explanation   Comprehension Verbalized understanding                     Problem List Patient Active Problem List   Diagnosis Date Noted  . Pressure ulcer 05/02/2015  . Hyponatremia 05/01/2015  . Acute-on-chronic kidney injury ([facility]) 05/01/2015  . Diabetes mellitus with hyperglycemia, with long-term current use of insulin ([facility]) 05/01/2015  . COPD exacerbation ([facility]) 04/30/2015  . Cellulitis of foot, right 03/24/2015  . Elevated d-dimer 03/24/2015  . Peripheral edema 12/04/2014  . Bilateral edema of lower extremity   . Diabetic ulcer of right great toe ([facility])   . Foot ulcer due to secondary DM ([facility]) 11/07/2014  . Diabetic ulcer of right foot ([facility]) 11/07/2014  . Edema of lower extremity 05/27/2013  . Headache 05/26/2013  . CKD (chronic kidney disease), stage III 04/14/2013  . Leukocytosis 04/14/2013  . Anemia in CKD (chronic kidney disease) 04/14/2013  . Dyspnea 04/14/2013  . Chronic diastolic CHF (congestive heart failure) ([facility])   . COPD (chronic obstructive pulmonary disease) ([facility]) 08/07/2012  . Morbid  obesity ([facility]) 08/07/2012  . DM type 2 (diabetes mellitus, type 2) ([facility]) 08/07/2012  . HTN (hypertension), benign 08/07/2012  . Hypothyroidism (acquired) 08/07/2012    Rayetta Humphrey, PT CLT [phone] 05/25/2015, 1:02 PM  [facility] [address] [place], [name], [address] Phone: [phone]   Fax:  [phone]  Name: MALEEKA SABATINO MRN: [id number] Date of Birth: 06/27/1950

## 2015-05-28 ENCOUNTER — Telehealth: Payer: Self-pay | Admitting: Internal Medicine

## 2015-05-28 ENCOUNTER — Ambulatory Visit (HOSPITAL_COMMUNITY): Payer: Medicare Other | Admitting: Physical Therapy

## 2015-05-28 DIAGNOSIS — R29898 Other symptoms and signs involving the musculoskeletal system: Secondary | ICD-10-CM | POA: Diagnosis not present

## 2015-05-28 DIAGNOSIS — R262 Difficulty in walking, not elsewhere classified: Secondary | ICD-10-CM | POA: Diagnosis not present

## 2015-05-28 DIAGNOSIS — I890 Lymphedema, not elsewhere classified: Secondary | ICD-10-CM | POA: Diagnosis not present

## 2015-05-28 DIAGNOSIS — R269 Unspecified abnormalities of gait and mobility: Secondary | ICD-10-CM | POA: Diagnosis not present

## 2015-05-28 DIAGNOSIS — T148XXA Other injury of unspecified body region, initial encounter: Secondary | ICD-10-CM

## 2015-05-28 DIAGNOSIS — T148 Other injury of unspecified body region: Secondary | ICD-10-CM | POA: Diagnosis not present

## 2015-05-28 NOTE — Telephone Encounter (Signed)
Patient states that she would like to speak with Dr.Taylor regarding her husband Carigan Lister 08/17/46). Patient's husband is deceased and has been for some time. / tg

## 2015-05-28 NOTE — Therapy (Signed)
[facility], [name], [address] Phone: [phone]   Fax:  [phone]  Wound Care Therapy  Patient Details  Name: Stephanie Combs MRN: [id number] Date of Birth: Mar 15, 1951 No Data Recorded  Encounter Date: 05/28/2015      PT End of Session - 05/28/15 1248    Visit Number 4   Number of Visits 12   Date for PT Re-Evaluation 06/15/15   Authorization Type [place] - Visit Number 4   Authorization - Number of Visits 10   PT Start Time 1130   PT Stop Time 1236   PT Time Calculation (min) 66 min   Activity Tolerance Patient tolerated treatment well   Behavior During Therapy [facility] for tasks assessed/performed      Past Medical History  Diagnosis Date  . Hypertension   . Diabetes mellitus without complication ([facility])   . COPD (chronic obstructive pulmonary disease) ([facility])   . CHF (congestive heart failure) ([facility])     Diastolic  . Anxiety   . Chronic back pain   . Hypothyroidism   . Pedal edema   . On home O2     2.5 L N/C prn  . Degenerative disk disease     Past Surgical History  Procedure Laterality Date  . Tubal ligation    . Hernia repair      There were no vitals filed for this visit.  Visit Diagnosis:  Nonhealing nonsurgical wound  Lymphedema  Leg heaviness  Difficulty walking  Abnormal gait                 Wound Therapy - 05/28/15 1240    Subjective Pt states that she has had to void more often. States that her bandages slid down.   Patient and Family Stated Goals Legs swelling to go down as well as wound to heal.  To be able to get around again    Date of Onset 08/11/14   Prior Treatments hospitalized, various antibiotics, self care.    Pain Assessment 0-10   Pain Score 3    Pain Type Chronic pain   Pain Location Toe (Comment which one)   Pain Descriptors / Indicators Aching;Burning   Pain Onset On-going   Patients Stated Pain Goal 0   Evaluation and  Treatment Procedures Explained to Patient/Family Yes   Evaluation and Treatment Procedures agreed to   Wound Properties Date First Assessed: 11/07/14 Location: Toe (Comment  which one) , right great toe  Location Orientation: Right Wound Description (Comments): small open hole under the right grest toe   Dressing Type Gauze (Comment);Silver hydrofiber   Dressing Changed Changed   Dressing Status Clean;Old drainage   Dressing Change Frequency PRN   Site / Wound Assessment Granulation tissue;Painful;Yellow   % Wound base Red or Granulating 90%   % Wound base Yellow 10%   Peri-wound Assessment Edema;Induration   Margins Epibole (rolled edges)   Drainage Amount Minimal   Treatment Cleansed;Other (Comment)  manual therapy for lymphedema B LE    Wound Therapy - Clinical Statement PT concerned about dressing falling down.  Explained to pt that profore is not for lymphedema and she would do better with short stretch bandages but pt states that she is not able to affort the bandages.    Wound Therapy - Functional Problem List Live impact score 84/90(impacting life severly)   Factors Delaying/Impairing Wound Healing Altered sensation;Diabetes Mellitus;Infection - systemic/local;Immobility;Multiple medical problems;Tobacco use  Hydrotherapy Plan Debridement;Dressing change;Patient/family education;Other (comment)  begin lymphedema treatment as soon as possible   Wound Therapy - Frequency Other (comment)   Wound Therapy - Current Recommendations --  2x due to transportation issues.    Wound Therapy - Follow Up Recommendations Other (comment)  manual techniques for lymphedema    Wound Plan If dressing continues to fall down may have a trial of foam with profore to see if this assists in keeping dressing up.  Pt to be remeasured next treatment. Pt to recieve debridement as necessary to wound followed by manual techniques to decrease edema.    Dressing  changed to [place] as wound bed appears dry at  this time.    Manual Therapy supraclavicular, deep and superfical abdominal followed by routing fluid using inguinal/axillary anastomosis anteriorly and LE .  Manual done on both the right and the left side.    Decrease Necrotic Tissue to STG:  2 wk 0%   Decrease Necrotic Tissue - Progress Progressing toward goal   Increase Granulation Tissue to STG: 2 weeks 100%    Increase Granulation Tissue - Progress Progressing toward goal   Decrease Length/Width/Depth by (cm) STG: 2 weeks decrease depth and undermining by .3cm; LTG: 6 weeks:  decease by .7x.4.x.5   Decrease Length/Width/Depth - Progress Progressing toward goal   Improve Drainage Characteristics --  scant   Improve Drainage Characteristics - Progress Progressing toward goal   Patient/Family will be able to  STG: 2 weeks understanding of lymphedema and risk of cellulitis;  4 weeK;; Signs and symptoms of cellulitis    Patient/Family Instruction Goal - Progress Progressing toward goal   Additional Wound Therapy Goal STG 3 weeks:  volume to be decreased by 20%; LTG 6 weeks volume to be decreased by 35%  6 weeks:  pt to have been fitted for a compression garment   Additional Wound Therapy Goal - Progress Progressing toward goal   Goals/treatment plan/discharge plan were made with and agreed upon by patient/family Yes   Time For Goal Achievement --  6 weeks    Wound Therapy - Potential for Goals Fair                 PT Education - 05/28/15 1247    Education provided Yes   Education Details explained again that if the coban begins to slide down her leg she should take it off.  Pt had tied "knots" in the bandage attempting to keep it on which caused uneven compression.   Person(s) Educated Patient   Methods Explanation   Comprehension Verbalized understanding                     Problem List Patient Active Problem List   Diagnosis Date Noted  . Pressure ulcer 05/02/2015  . Hyponatremia 05/01/2015  .  Acute-on-chronic kidney injury ([facility]) 05/01/2015  . Diabetes mellitus with hyperglycemia, with long-term current use of insulin ([place]) 05/01/2015  . COPD exacerbation ([facility]) 04/30/2015  . Cellulitis of foot, right 03/24/2015  . Elevated d-dimer 03/24/2015  . Peripheral edema 12/04/2014  . Bilateral edema of lower extremity   . Diabetic ulcer of right great toe ([facility])   . Foot ulcer due to secondary DM ([place]) 11/07/2014  . Diabetic ulcer of right foot ([place]) 11/07/2014  . Edema of lower extremity 05/27/2013  . Headache 05/26/2013  . CKD (chronic kidney disease), stage III 04/14/2013  . Leukocytosis 04/14/2013  . Anemia in CKD (chronic kidney disease) 04/14/2013  . Dyspnea  04/14/2013  . Chronic diastolic CHF (congestive heart failure) ([place])   . COPD (chronic obstructive pulmonary disease) ([place]) 08/07/2012  . Morbid obesity ([place]) 08/07/2012  . DM type 2 (diabetes mellitus, type 2) ([facility] [place]) 08/07/2012  . HTN (hypertension), benign 08/07/2012  . Hypothyroidism (acquired) 08/07/2012   Rayetta Humphrey, PT CLT [phone] 05/28/2015, 12:49 PM  [facility] [address] [place], [name], [address] Phone: [phone]   Fax:  [phone]  Name: Stephanie Combs MRN: [id number] Date of Birth: [birth year]

## 2015-05-28 NOTE — Telephone Encounter (Signed)
Will route to Dr Lovena Le and he will decide if he wishes to speak with patient

## 2015-05-29 DIAGNOSIS — I890 Lymphedema, not elsewhere classified: Secondary | ICD-10-CM | POA: Diagnosis not present

## 2015-05-30 ENCOUNTER — Ambulatory Visit (HOSPITAL_COMMUNITY): Payer: Medicare Other | Admitting: Physical Therapy

## 2015-05-30 DIAGNOSIS — R269 Unspecified abnormalities of gait and mobility: Secondary | ICD-10-CM

## 2015-05-30 DIAGNOSIS — T148 Other injury of unspecified body region: Secondary | ICD-10-CM | POA: Diagnosis not present

## 2015-05-30 DIAGNOSIS — I890 Lymphedema, not elsewhere classified: Secondary | ICD-10-CM

## 2015-05-30 DIAGNOSIS — R262 Difficulty in walking, not elsewhere classified: Secondary | ICD-10-CM | POA: Diagnosis not present

## 2015-05-30 DIAGNOSIS — T148XXA Other injury of unspecified body region, initial encounter: Secondary | ICD-10-CM

## 2015-05-30 DIAGNOSIS — R29898 Other symptoms and signs involving the musculoskeletal system: Secondary | ICD-10-CM

## 2015-05-30 NOTE — Therapy (Signed)
[facility] [place], [name], [address] Phone: [phone]   Fax:  [phone]  Wound Care Therapy  Patient Details  Name: Stephanie Combs MRN: [id number] Date of Birth: 03/15/1951 No Data Recorded  Encounter Date: 05/30/2015      PT End of Session - 05/30/15 1612    Visit Number 5   Number of Visits 12   Date for PT Re-Evaluation 06/15/15   Authorization Type [employer] medicare   Authorization - Visit Number 5   Authorization - Number of Visits 10   PT Start Time 5852  Pt late for appointment    PT Stop Time 1145   PT Time Calculation (min) 70 min   Activity Tolerance Patient tolerated treatment well      Past Medical History  Diagnosis Date  . Hypertension   . Diabetes mellitus without complication ([facility])   . COPD (chronic obstructive pulmonary disease) ([facility])   . CHF (congestive heart failure) ([facility])     Diastolic  . Anxiety   . Chronic back pain   . Hypothyroidism   . Pedal edema   . On home O2     2.5 L N/C prn  . Degenerative disk disease     Past Surgical History  Procedure Laterality Date  . Tubal ligation    . Hernia repair      There were no vitals filed for this visit.  Visit Diagnosis:  Nonhealing nonsurgical wound  Lymphedema  Leg heaviness  Difficulty walking  Abnormal gait                 Wound Therapy - 05/30/15 1603    Subjective Pt states that her dressings stayed up this time; states that she has had to void more often. States that her bandages slid down.   Patient and Family Stated Goals Legs swelling to go down as well as wound to heal.  To be able to get around again    Date of Onset 08/11/14   Prior Treatments hospitalized, various antibiotics, self care.    Pain Assessment 0-10   Pain Score 3    Pain Type Chronic pain   Pain Location Toe (Comment which one)   Pain Descriptors / Indicators Aching;Sore   Pain Onset On-going   Patients Stated Pain Goal 0   Evaluation and Treatment Procedures Explained to Patient/Family Yes   Evaluation and Treatment Procedures agreed to   Wound Properties Date First Assessed: 11/07/14 Location: Toe (Comment  which one) , right great toe  Location Orientation: Right Wound Description (Comments): small open hole under the right grest toe   Dressing Type Compression wrap;Gauze (Comment);Other (Comment)  medihoney   Dressing Changed Changed   Dressing Status Clean;Old drainage   Dressing Change Frequency PRN   Site / Wound Assessment Granulation tissue;Painful;Yellow   % Wound base Red or Granulating 95%   % Wound base Yellow 5%   Peri-wound Assessment Edema;Induration   Wound Length (cm) 0.9 cm   Wound Width (cm) 0.6 cm   Wound Depth (cm) 0.5 cm   Margins Epibole (rolled edges)   Drainage Amount Minimal   Treatment Cleansed;Other (Comment)  manual lymph decongestive followed by compression bandage   Wound Therapy - Clinical Statement Wound bed is filling in with improved granulation.  Pt demonstrates decreased congestion in bilateral LE>     Wound Therapy - Functional Problem List Live impact score 84/90(impacting life severly)   Factors Delaying/Impairing Wound Healing Altered sensation;Diabetes  Mellitus;Infection - systemic/local;Immobility;Multiple medical problems;Tobacco use   Hydrotherapy Plan Debridement;Dressing change;Patient/family education;Other (comment)  begin lymphedema treatment as soon as possible   Wound Therapy - Frequency Other (comment)  decongestive manual techniques followed by compression.   Wound Therapy - Current Recommendations --  2x due to transportation issues.    Wound Therapy - Follow Up Recommendations Other (comment)  manual techniques for lymphedema    Wound Plan Continue to see for wound care and manual decongestive techniques.  Pt to be remeasured next treatment.    Dressing  continue with medihoney followed by compression wrapping to include toe and profore to LE     Manual Therapy supraclavicular, deep and superfical abdominal followed by routing fluid using inguinal/axillary anastomosis anteriorly and LE .  Manual done on both the right and the left side.    Decrease Necrotic Tissue to STG:  2 wk 0%   Decrease Necrotic Tissue - Progress Progressing toward goal   Increase Granulation Tissue to STG: 2 weeks 100%    Increase Granulation Tissue - Progress Progressing toward goal   Decrease Length/Width/Depth by (cm) STG: 2 weeks decrease depth and undermining by .3cm; LTG: 6 weeks:  decease by .7x.4.x.5   Decrease Length/Width/Depth - Progress Progressing toward goal   Improve Drainage Characteristics --  scant   Improve Drainage Characteristics - Progress Progressing toward goal   Patient/Family will be able to  STG: 2 weeks understanding of lymphedema and risk of cellulitis;  4 weeK;; Signs and symptoms of cellulitis    Patient/Family Instruction Goal - Progress Progressing toward goal   Additional Wound Therapy Goal STG 3 weeks:  volume to be decreased by 20%; LTG 6 weeks volume to be decreased by 35%  6 weeks:  pt to have been fitted for a compression garment   Additional Wound Therapy Goal - Progress Progressing toward goal   Goals/treatment plan/discharge plan were made with and agreed upon by patient/family Yes   Time For Goal Achievement --  6 weeks    Wound Therapy - Potential for Goals Good             Problem List Patient Active Problem List   Diagnosis Date Noted  . Pressure ulcer 05/02/2015  . Hyponatremia 05/01/2015  . Acute-on-chronic kidney injury ([place]) 05/01/2015  . Diabetes mellitus with hyperglycemia, with long-term current use of insulin ([place]) 05/01/2015  . COPD exacerbation ([place]) 04/30/2015  . Cellulitis of foot, right 03/24/2015  . Elevated d-dimer 03/24/2015  . Peripheral edema 12/04/2014  . Bilateral edema of lower extremity   . Diabetic ulcer of right great toe ([place])   . Foot ulcer due to secondary DM ([place])  11/07/2014  . Diabetic ulcer of right foot ([place]) 11/07/2014  . Edema of lower extremity 05/27/2013  . Headache 05/26/2013  . CKD (chronic kidney disease), stage III 04/14/2013  . Leukocytosis 04/14/2013  . Anemia in CKD (chronic kidney disease) 04/14/2013  . Dyspnea 04/14/2013  . Chronic diastolic CHF (congestive heart failure) ([place])   . COPD (chronic obstructive pulmonary disease) ([place]) 08/07/2012  . Morbid obesity ([place]) 08/07/2012  . DM type 2 (diabetes mellitus, type 2) ([place]) 08/07/2012  . HTN (hypertension), benign 08/07/2012  . Hypothyroidism (acquired) 08/07/2012    Rayetta Humphrey, PT CLT [phone] 05/30/2015, 4:14 PM  [facility] [address] [place], [name], [address] Phone: [phone]   Fax:  [phone]  Name: RONNELL MAKAREWICZ MRN: [id number] Date of Birth: 11/28/1950

## 2015-06-01 ENCOUNTER — Telehealth (HOSPITAL_COMMUNITY): Payer: Self-pay | Admitting: Physical Therapy

## 2015-06-01 ENCOUNTER — Ambulatory Visit (HOSPITAL_COMMUNITY): Payer: Medicare Other | Admitting: Physical Therapy

## 2015-06-01 NOTE — Telephone Encounter (Signed)
She can not get out of her home and keep her feet dry due to the rain, therefore she will not come for wound care today. NF 06/01/15

## 2015-06-03 DIAGNOSIS — J449 Chronic obstructive pulmonary disease, unspecified: Secondary | ICD-10-CM | POA: Diagnosis not present

## 2015-06-06 ENCOUNTER — Ambulatory Visit (HOSPITAL_COMMUNITY): Payer: Medicare Other | Admitting: Physical Therapy

## 2015-06-06 DIAGNOSIS — T148XXA Other injury of unspecified body region, initial encounter: Secondary | ICD-10-CM

## 2015-06-06 DIAGNOSIS — I890 Lymphedema, not elsewhere classified: Secondary | ICD-10-CM | POA: Diagnosis not present

## 2015-06-06 DIAGNOSIS — T148 Other injury of unspecified body region: Secondary | ICD-10-CM | POA: Diagnosis not present

## 2015-06-06 DIAGNOSIS — R262 Difficulty in walking, not elsewhere classified: Secondary | ICD-10-CM

## 2015-06-06 DIAGNOSIS — R269 Unspecified abnormalities of gait and mobility: Secondary | ICD-10-CM | POA: Diagnosis not present

## 2015-06-06 DIAGNOSIS — R29898 Other symptoms and signs involving the musculoskeletal system: Secondary | ICD-10-CM

## 2015-06-06 NOTE — Therapy (Signed)
[place], [name], [address] Phone: [phone]   Fax:  [phone]  Wound Care Therapy  Patient Details  Name: Stephanie Combs MRN: [id number] Date of Birth: 05-21-1950 No Data Recorded  Encounter Date: 06/06/2015      PT End of Session - 06/06/15 1757    Visit Number 6   Number of Visits 12   Date for PT Re-Evaluation 06/15/15   Authorization Type [employer] medicare   Authorization - Visit Number 6   Authorization - Number of Visits 10   PT Start Time 1300   PT Stop Time 1440   PT Time Calculation (min) 100 min   Activity Tolerance Patient tolerated treatment well   Behavior During Therapy [facility] for tasks assessed/performed      Past Medical History  Diagnosis Date  . Hypertension   . Diabetes mellitus without complication ([place])   . COPD (chronic obstructive pulmonary disease) ([place])   . CHF (congestive heart failure) ([facility])     Diastolic  . Anxiety   . Chronic back pain   . Hypothyroidism   . Pedal edema   . On home O2     2.5 L N/C prn  . Degenerative disk disease     Past Surgical History  Procedure Laterality Date  . Tubal ligation    . Hernia repair      There were no vitals filed for this visit.  Visit Diagnosis:  Nonhealing nonsurgical wound  Lymphedema  Leg heaviness  Difficulty walking  Abnormal gait                 Wound Therapy - 06/06/15 1750    Subjective PT states her legs feel really good and she can tell they have gotten smaller.   Patient and Family Stated Goals Legs swelling to go down as well as wound to heal.  To be able to get around again    Date of Onset 08/11/14   Prior Treatments hospitalized, various antibiotics, self care.    Pain Assessment No/denies pain   Wound Properties Date First Assessed: 11/07/14 Location: Toe (Comment  which one) , right great toe  Location Orientation: Right Wound Description (Comments): small open hole under the right  grest toe   Dressing Type Compression wrap;Gauze (Comment);Other (Comment)  medihoney gel   Dressing Changed Changed   Dressing Status Clean;Old drainage   Dressing Change Frequency PRN   Site / Wound Assessment Granulation tissue;Painful;Yellow   % Wound base Red or Granulating 95%   % Wound base Yellow 5%   Peri-wound Assessment Edema;Induration   Wound Length (cm) 0.5 cm   Wound Width (cm) 0.5 cm   Wound Depth (cm) 0.3 cm   Margins Epibole (rolled edges)   Drainage Amount Minimal   Treatment Cleansed;Debridement (Selective)   Selective Debridement - Location Plantar surface of Rt great toe   Selective Debridement - Tools Used Scissors;Forceps   Selective Debridement - Tissue Removed callous and slough   Wound Therapy - Clinical Statement Wound remeasured today with overall reduction in size.  Circumferential measurements also taken of bilateral LE's with reduction of 1,838.3cc from [place] and 1,457.3cc from [place].  Anticipate patient will be ready for garments soon pending on complete healing of Rt great toe.    Wound Therapy - Functional Problem List Live impact score 84/90(impacting life severly)   Factors Delaying/Impairing Wound Healing Altered sensation;Diabetes Mellitus;Infection - systemic/local;Immobility;Multiple medical problems;Tobacco use   Hydrotherapy Plan  Debridement;Dressing change;Patient/family education;Other (comment)  begin lymphedema treatment as soon as possible   Wound Therapy - Frequency Other (comment)  decongestive manual techniques followed by compression.   Wound Therapy - Current Recommendations --  2x due to transportation issues.    Wound Therapy - Follow Up Recommendations Other (comment)  manual techniques for lymphedema    Wound Plan Continue to see for wound care and manual decongestive techniques.  Pt to be remeasured next treatment.    Dressing  continue with medihoney followed by compression wrapping to include toe bandaging and profore to LE     Manual Therapy Manual for bilateral LE's, short neck followed by routing fluid using inguinal/axillary anastomosis anteriorly and LE .  Manual done on both the right and the left side.    Manual Therapy weight 260.4   Decrease Necrotic Tissue to STG:  2 wk 0%   Increase Granulation Tissue to STG: 2 weeks 100%    Decrease Length/Width/Depth by (cm) STG: 2 weeks decrease depth and undermining by .3cm; LTG: 6 weeks:  decease by .7x.4.x.5   Improve Drainage Characteristics --  scant   Patient/Family will be able to  STG: 2 weeks understanding of lymphedema and risk of cellulitis;  4 weeK;; Signs and symptoms of cellulitis    Additional Wound Therapy Goal STG 3 weeks:  volume to be decreased by 20%; LTG 6 weeks volume to be decreased by 35%  6 weeks:  pt to have been fitted for a compression garment   Goals/treatment plan/discharge plan were made with and agreed upon by patient/family Yes   Time For Goal Achievement --  6 weeks    Wound Therapy - Potential for Goals Good        Lymphedema volumes measured in CC   Date 05/16/2015  06/06/2015    Rt LE Lt LE Rt LE Lt LE  MTP 30.00 29.20 26.8 26.5  ankle 32 31.5 32.00 31.00  4cm 41.20 34.50 28.40 28.70  8cm 47.00 42.00 34.70 33.20  12 cm 53.00 54.30 40.50 39.40  16cm 55.50 55.20 45.00 43.30  20cm 56.50 54.00 48.00 46.00  24cm 54.20 47.00 48.50 47.00              Sum of squares 17849.58 15919.67 12074.39 11341.43  Total Volume 5681.7 5067.39 3843.399 3610.091                        Problem List Patient Active Problem List   Diagnosis Date Noted  . Pressure ulcer 05/02/2015  . Hyponatremia 05/01/2015  . Acute-on-chronic kidney injury ([place]) 05/01/2015  . Diabetes mellitus with hyperglycemia, with long-term current use of insulin ([place]) 05/01/2015  . COPD exacerbation ([place]) 04/30/2015  . Cellulitis of foot, right 03/24/2015  . Elevated d-dimer 03/24/2015  . Peripheral edema 12/04/2014  . Bilateral edema of lower  extremity   . Diabetic ulcer of right great toe ([place])   . Foot ulcer due to secondary DM ([place]) 11/07/2014  . Diabetic ulcer of right foot ([place]) 11/07/2014  . Edema of lower extremity 05/27/2013  . Headache 05/26/2013  . CKD (chronic kidney disease), stage III 04/14/2013  . Leukocytosis 04/14/2013  . Anemia in CKD (chronic kidney disease) 04/14/2013  . Dyspnea 04/14/2013  . Chronic diastolic CHF (congestive heart failure) ([place])   . COPD (chronic obstructive pulmonary disease) ([place]) 08/07/2012  . Morbid obesity ([place]) 08/07/2012  . DM type 2 (diabetes mellitus, type 2) ([place]) 08/07/2012  . HTN (hypertension), benign 08/07/2012  .  Hypothyroidism (acquired) 08/07/2012    Teena Irani, PTA/CLT [phone]  06/06/2015, 6:02 PM  [facility] [address] [place], [name], [address] Phone: [phone]   Fax:  [phone]  Name: Stephanie Combs MRN: [id number] Date of Birth: [birth year]

## 2015-06-08 ENCOUNTER — Ambulatory Visit (HOSPITAL_COMMUNITY): Payer: Medicare Other | Admitting: Physical Therapy

## 2015-06-08 DIAGNOSIS — R29898 Other symptoms and signs involving the musculoskeletal system: Secondary | ICD-10-CM | POA: Diagnosis not present

## 2015-06-08 DIAGNOSIS — T148 Other injury of unspecified body region: Secondary | ICD-10-CM | POA: Diagnosis not present

## 2015-06-08 DIAGNOSIS — R269 Unspecified abnormalities of gait and mobility: Secondary | ICD-10-CM

## 2015-06-08 DIAGNOSIS — R262 Difficulty in walking, not elsewhere classified: Secondary | ICD-10-CM

## 2015-06-08 DIAGNOSIS — T148XXA Other injury of unspecified body region, initial encounter: Secondary | ICD-10-CM

## 2015-06-08 DIAGNOSIS — I890 Lymphedema, not elsewhere classified: Secondary | ICD-10-CM

## 2015-06-08 NOTE — Therapy (Addendum)
[place], [name], [address] Phone: [phone]   Fax:  [phone]  Wound Care Therapy  Patient Details  Name: Stephanie Combs MRN: [id number] Date of Birth: [birth year] No Data Recorded  Encounter Date: 06/08/2015      PT End of Session - 06/08/15 1153    Visit Number 7   Number of Visits 12   Date for PT Re-Evaluation 06/15/15   Authorization - Visit Number 7   Authorization - Number of Visits 10   PT Start Time 1020   PT Stop Time 1138   PT Time Calculation (min) 78 min   Activity Tolerance Patient tolerated treatment well      Past Medical History  Diagnosis Date  . Hypertension   . Diabetes mellitus without complication ([place])   . COPD (chronic obstructive pulmonary disease) ([place])   . CHF (congestive heart failure) ([facility])     Diastolic  . Anxiety   . Chronic back pain   . Hypothyroidism   . Pedal edema   . On home O2     2.5 L N/C prn  . Degenerative disk disease     Past Surgical History  Procedure Laterality Date  . Tubal ligation    . Hernia repair      There were no vitals filed for this visit.  Visit Diagnosis:  Nonhealing nonsurgical wound  Lymphedema  Leg heaviness  Difficulty walking  Abnormal gait                 Wound Therapy - 06/08/15 1143    Subjective Pt states that she has not seen her legs as small as they are for years.  States it is easier to lift them into the bed.    Patient and Family Stated Goals Legs swelling to go down as well as wound to heal.  To be able to get around again    Date of Onset 08/11/14   Prior Treatments hospitalized, various antibiotics, self care.    Pain Assessment No/denies pain   Evaluation and Treatment Procedures agreed to   Wound Properties Date First Assessed: 11/07/14 Location: Toe (Comment  which one) , right great toe  Location Orientation: Right Wound Description (Comments): small open hole under the right grest  toe   Dressing Type Compression wrap;Gauze (Comment);Other (Comment)  medihoney gel   Dressing Changed Changed   Dressing Status Clean;Old drainage   Dressing Change Frequency PRN   Site / Wound Assessment Granulation tissue;Painful;Yellow   % Wound base Red or Granulating --  98%   % Wound base Yellow --  2%   Peri-wound Assessment Edema   Wound Length (cm) 0.5 cm   Wound Width (cm) 0.5 cm   Wound Depth (cm) 0.2 cm   Margins Attached edges (approximated)   Drainage Amount Minimal   Treatment Cleansed;Other (Comment)  manual decongestive techniques with compression bandaging    Wound Therapy - Clinical Statement Pt wound continues to decrease in size as well as volume of LE>  Pt will continue to benefit from  Skilled therapy until wound is healed and pt has compression garments.    Wound Therapy - Functional Problem List Live impact score 84/90(impacting life severly)   Factors Delaying/Impairing Wound Healing Altered sensation;Diabetes Mellitus;Infection - systemic/local;Immobility;Multiple medical problems;Tobacco use   Hydrotherapy Plan Debridement;Dressing change;Patient/family education;Other (comment)  begin lymphedema treatment as soon as possible   Wound Therapy - Frequency Other (comment)  decongestive manual techniques followed  by compression.   Wound Therapy - Current Recommendations --  2x due to transportation issues.    Wound Therapy - Follow Up Recommendations Other (comment)  manual techniques for lymphedema    Wound Plan Continue to see for wound care and manual decongestive techniques.  Pt to be remeasured next treatment.    Dressing  continue with medihoney followed by compression wrapping to include toe bandaging and profore to LE    Manual Therapy Manual for bilateral LE's, short neck followed by routing fluid using inguinal/axillary anastomosis anteriorly and LE .  Manual done on both the right and the left side.    Decrease Necrotic Tissue to STG:  2 wk 0%    Decrease Necrotic Tissue - Progress Progressing toward goal   Increase Granulation Tissue to STG: 2 weeks 100%    Increase Granulation Tissue - Progress Progressing toward goal   Decrease Length/Width/Depth by (cm) STG: 2 weeks decrease depth and undermining by .3cm; LTG: 6 weeks:  decease by .7x.4.x.5   Improve Drainage Characteristics --  scant   Patient/Family will be able to  STG: 2 weeks understanding of lymphedema and risk of cellulitis;  4 weeK;; Signs and symptoms of cellulitis    Patient/Family Instruction Goal - Progress Met   Additional Wound Therapy Goal STG 3 weeks:  volume to be decreased by 20%; LTG 6 weeks volume to be decreased by 35%  Rt decreased 31%; LT 37%   Additional Wound Therapy Goal - Progress Met   Goals/treatment plan/discharge plan were made with and agreed upon by patient/family Yes   Time For Goal Achievement --  6 weeks    Wound Therapy - Potential for Goals Good                              Problem List Patient Active Problem List   Diagnosis Date Noted  . Pressure ulcer 05/02/2015  . Hyponatremia 05/01/2015  . Acute-on-chronic kidney injury ([place]) 05/01/2015  . Diabetes mellitus with hyperglycemia, with long-term current use of insulin ([place]) 05/01/2015  . COPD exacerbation ([place]) 04/30/2015  . Cellulitis of foot, right 03/24/2015  . Elevated d-dimer 03/24/2015  . Peripheral edema 12/04/2014  . Bilateral edema of lower extremity   . Diabetic ulcer of right great toe ([place])   . Foot ulcer due to secondary DM ([place]) 11/07/2014  . Diabetic ulcer of right foot ([place]) 11/07/2014  . Edema of lower extremity 05/27/2013  . Headache 05/26/2013  . CKD (chronic kidney disease), stage III 04/14/2013  . Leukocytosis 04/14/2013  . Anemia in CKD (chronic kidney disease) 04/14/2013  . Dyspnea 04/14/2013  . Chronic diastolic CHF (congestive heart failure) ([place])   . COPD (chronic obstructive pulmonary disease) ([place]) 08/07/2012  . Morbid  obesity ([place]) 08/07/2012  . DM type 2 (diabetes mellitus, type 2) ([place]) 08/07/2012  . HTN (hypertension), benign 08/07/2012  . Hypothyroidism (acquired) 08/07/2012  Rayetta Humphrey, PT CLT [phone] 06/08/2015, 11:54 AM  [facility] [address] [place], [name], [address] Phone: [phone]   Fax:  [phone]  Name: SHERRYLL SKOCZYLAS MRN: [id number] Date of Birth: March 02, 1951

## 2015-06-13 ENCOUNTER — Ambulatory Visit (HOSPITAL_COMMUNITY): Payer: Medicare Other | Attending: Internal Medicine | Admitting: Physical Therapy

## 2015-06-13 DIAGNOSIS — I890 Lymphedema, not elsewhere classified: Secondary | ICD-10-CM | POA: Diagnosis present

## 2015-06-13 DIAGNOSIS — R269 Unspecified abnormalities of gait and mobility: Secondary | ICD-10-CM | POA: Diagnosis present

## 2015-06-13 DIAGNOSIS — R29898 Other symptoms and signs involving the musculoskeletal system: Secondary | ICD-10-CM | POA: Diagnosis present

## 2015-06-13 DIAGNOSIS — T148 Other injury of unspecified body region: Secondary | ICD-10-CM | POA: Diagnosis not present

## 2015-06-13 DIAGNOSIS — T148XXA Other injury of unspecified body region, initial encounter: Secondary | ICD-10-CM

## 2015-06-13 DIAGNOSIS — R262 Difficulty in walking, not elsewhere classified: Secondary | ICD-10-CM | POA: Diagnosis present

## 2015-06-13 NOTE — Therapy (Signed)
[place], [name], [address] Phone: [phone]   Fax:  [phone]  Physical Therapy Treatment  Patient Details  Name: Stephanie Combs MRN: [id number] Date of Birth: 02/12/1951 No Data Recorded  Encounter Date: 06/13/2015      PT End of Session - 06/13/15 1628    Visit Number 8   Number of Visits 12   Date for PT Re-Evaluation 06/15/15   Authorization - Visit Number 8   Authorization - Number of Visits 10   PT Start Time 1020   PT Stop Time 1130   PT Time Calculation (min) 70 min   Activity Tolerance Patient tolerated treatment well   Behavior During Therapy [facility] for tasks assessed/performed      Past Medical History  Diagnosis Date  . Hypertension   . Diabetes mellitus without complication ([place])   . COPD (chronic obstructive pulmonary disease) ([place])   . CHF (congestive heart failure) ([facility])     Diastolic  . Anxiety   . Chronic back pain   . Hypothyroidism   . Pedal edema   . On home O2     2.5 L N/C prn  . Degenerative disk disease     Past Surgical History  Procedure Laterality Date  . Tubal ligation    . Hernia repair      There were no vitals filed for this visit.  Visit Diagnosis:  Nonhealing nonsurgical wound  Lymphedema  Leg heaviness  Difficulty walking  Abnormal gait                     Wound Therapy - 06/13/15 1622    Subjective Pt states she is not feeling great and is tired.  Reported being short of breath also.  BP taken as 137/60,  HR 79bpm.   Date of Onset 08/11/14   Prior Treatments hospitalized, various antibiotics, self care.    Pain Assessment No/denies pain   Evaluation and Treatment Procedures agreed to   Wound Properties Date First Assessed: 11/07/14 Location: Toe (Comment  which one) , right great toe  Location Orientation: Right Wound Description (Comments): small open hole under the right grest toe   Dressing Type Compression wrap;Gauze  (Comment);Other (Comment)  medihoney gel   Dressing Changed Changed   Dressing Status Clean;Old drainage   Dressing Change Frequency PRN   Site / Wound Assessment Granulation tissue;Painful;Yellow   % Wound base Red or Granulating 95%  98%   % Wound base Yellow 5%  2%   Peri-wound Assessment Edema;Induration  callous   Drainage Amount Minimal   Treatment Cleansed;Debridement (Selective)   Selective Debridement - Location Plantar surface of Rt great toe   Selective Debridement - Tools Used Scalpel;Forceps   Selective Debridement - Tissue Removed callous and slough   Wound Therapy - Clinical Statement Removed large amount of callous from perimeter of wound.  This seems to be preventing approximation. Continued with medihoney dressing to promote drainage and granulation.  Pt tolerated well without pain.  manual lymph drainage completed after woundcare and prior to dressing change using profore bandage system.     Wound Therapy - Functional Problem List --   Factors Delaying/Impairing Wound Healing Altered sensation;Diabetes Mellitus;Infection - systemic/local;Immobility;Multiple medical problems;Tobacco use   Hydrotherapy Plan --   Wound Therapy - Frequency --   Wound Therapy - Current Recommendations --   Wound Therapy - Follow Up Recommendations --   Wound Plan Continue to see for wound  care and manual decongestive techniques.     Dressing  medihoney gauze, 2X2, toebandages and profore system   Manual Therapy Manual for bilateral LE's, short neck followed by routing fluid using inguinal/axillary anastomosis anteriorly and LE .  Manual done on both the right and the left side.    Decrease Necrotic Tissue to STG:  2 wk 0%   Decrease Necrotic Tissue - Progress Progressing toward goal   Increase Granulation Tissue to STG: 2 weeks 100%    Increase Granulation Tissue - Progress Progressing toward goal   Decrease Length/Width/Depth by (cm) STG: 2 weeks decrease depth and undermining by .3cm;  LTG: 6 weeks:  decease by .7x.4.x.5   Improve Drainage Characteristics --  scant   Patient/Family will be able to  STG: 2 weeks understanding of lymphedema and risk of cellulitis;  4 weeK;; Signs and symptoms of cellulitis    Patient/Family Instruction Goal - Progress Met   Additional Wound Therapy Goal STG 3 weeks:  volume to be decreased by 20%; LTG 6 weeks volume to be decreased by 35%  Rt decreased 31%; LT 37%   Additional Wound Therapy Goal - Progress Met   Goals/treatment plan/discharge plan were made with and agreed upon by patient/family Yes   Time For Goal Achievement --  6 weeks    Wound Therapy - Potential for Goals Good                              Problem List Patient Active Problem List   Diagnosis Date Noted  . Pressure ulcer 05/02/2015  . Hyponatremia 05/01/2015  . Acute-on-chronic kidney injury ([place]) 05/01/2015  . Diabetes mellitus with hyperglycemia, with long-term current use of insulin ([place]) 05/01/2015  . COPD exacerbation ([place]) 04/30/2015  . Cellulitis of foot, right 03/24/2015  . Elevated d-dimer 03/24/2015  . Peripheral edema 12/04/2014  . Bilateral edema of lower extremity   . Diabetic ulcer of right great toe ([place])   . Foot ulcer due to secondary DM ([place]) 11/07/2014  . Diabetic ulcer of right foot ([place]) 11/07/2014  . Edema of lower extremity 05/27/2013  . Headache 05/26/2013  . CKD (chronic kidney disease), stage III 04/14/2013  . Leukocytosis 04/14/2013  . Anemia in CKD (chronic kidney disease) 04/14/2013  . Dyspnea 04/14/2013  . Chronic diastolic CHF (congestive heart failure) ([place])   . COPD (chronic obstructive pulmonary disease) ([place]) 08/07/2012  . Morbid obesity ([place]) 08/07/2012  . DM type 2 (diabetes mellitus, type 2) ([place]) 08/07/2012  . HTN (hypertension), benign 08/07/2012  . Hypothyroidism (acquired) 08/07/2012    Teena Irani, PTA/CLT [phone]  06/13/2015, 4:29 PM  [facility] [address] [place], [name], [address] Phone: [phone]   Fax:  [phone]  Name: Stephanie Combs MRN: [id number] Date of Birth: 09-Dec-1950

## 2015-06-14 DIAGNOSIS — E1121 Type 2 diabetes mellitus with diabetic nephropathy: Secondary | ICD-10-CM | POA: Diagnosis not present

## 2015-06-14 DIAGNOSIS — I10 Essential (primary) hypertension: Secondary | ICD-10-CM | POA: Diagnosis not present

## 2015-06-14 DIAGNOSIS — E038 Other specified hypothyroidism: Secondary | ICD-10-CM | POA: Diagnosis not present

## 2015-06-14 DIAGNOSIS — I208 Other forms of angina pectoris: Secondary | ICD-10-CM | POA: Diagnosis not present

## 2015-06-15 ENCOUNTER — Ambulatory Visit (HOSPITAL_COMMUNITY): Payer: Medicare Other | Admitting: Physical Therapy

## 2015-06-15 DIAGNOSIS — R29898 Other symptoms and signs involving the musculoskeletal system: Secondary | ICD-10-CM | POA: Diagnosis not present

## 2015-06-15 DIAGNOSIS — I890 Lymphedema, not elsewhere classified: Secondary | ICD-10-CM

## 2015-06-15 DIAGNOSIS — R269 Unspecified abnormalities of gait and mobility: Secondary | ICD-10-CM | POA: Diagnosis not present

## 2015-06-15 DIAGNOSIS — R262 Difficulty in walking, not elsewhere classified: Secondary | ICD-10-CM | POA: Diagnosis not present

## 2015-06-15 DIAGNOSIS — T148 Other injury of unspecified body region: Secondary | ICD-10-CM | POA: Diagnosis not present

## 2015-06-15 DIAGNOSIS — T148XXA Other injury of unspecified body region, initial encounter: Secondary | ICD-10-CM

## 2015-06-15 NOTE — Therapy (Addendum)
[place], [name], [address] Phone: [phone]   Fax:  [phone]  Wound Care Therapy  Patient Details  Name: Stephanie Combs MRN: [id number] Date of Birth: 1950/07/04 No Data Recorded  Encounter Date: 06/15/2015      PT End of Session - 06/15/15 1251    Visit Number 9   Number of Visits 16   Date for PT Re-Evaluation 07/15/15   Authorization Type UHC medicare   Authorization - Visit Number 9  Progress note sent at 9    Authorization - Number of Visits 19   PT Start Time 1016   PT Stop Time 1145   PT Time Calculation (min) 89 min   Activity Tolerance Patient tolerated treatment well   Behavior During Therapy [facility] for tasks assessed/performed      Past Medical History  Diagnosis Date  . Hypertension   . Diabetes mellitus without complication ([place])   . COPD (chronic obstructive pulmonary disease) ([place])   . CHF (congestive heart failure) (H[facility])     Diastolic  . Anxiety   . Chronic back pain   . Hypothyroidism   . Pedal edema   . On home O2     2.5 L N/C prn  . Degenerative disk disease     Past Surgical History  Procedure Laterality Date  . Tubal ligation    . Hernia repair      There were no vitals filed for this visit.  Visit Diagnosis:  Nonhealing nonsurgical wound  Lymphedema  Leg heaviness  Difficulty walking  Abnormal gait      Date 05/16/2015 05/16/2015 06/06/2015 06/06/2015 06/15/2015 06/15/2015   right left right left right  Left  MTP 30.00 29.20 26.8 26.5 27.5 26.7  ankle 32 31.5 32.00 31.00 28.80 28.00  4cm 41.20 34.50 28.40 28.70 29.00 28.70  8cm 47.00 42.00 34.70 33.20 24.50 31.70  12 cm 53.00 54.30 40.50 39.40 39.00 38.00  16cm 55.50 55.20 45.00 43.30 43.50 41.20  20cm 56.50 54.00 48.00 46.00 46.20 45.30  24cm 54.20 47.00 48.50 47.00 49.00 45.80  28cm        32cm        36cm        40cm        44cm        48cm        52cm        56cm        58cm        60cm                 Sum of squares 17849.58 15919.67 12074.39 11341.43 10975.63 10616.64  Total Volume 5681.7 1093.2355 3843.3991 3610.0906 7322.0254 3379.383                Wound Therapy - 06/15/15 1239    Subjective Pt states that her bandages were to tight and caused discomfort.    Patient and Family Stated Goals wound to heal and to not have to wear profore    Date of Onset 08/11/14   Prior Treatments hospitalized, various antibiotics, self care.    Pain Assessment No/denies pain  just soreness   Evaluation and Treatment Procedures agreed to   Wound Properties Date First Assessed: 11/07/14 Location: Toe (Comment  which one) , right great toe  Location Orientation: Right Wound Description (Comments): small open hole under the right grest toe   Dressing Type Compression wrap;Gauze (Comment);Other (Comment)  medihoney gel  Dressing Changed Changed   Dressing Status Clean;Old drainage   Dressing Change Frequency PRN   Site / Wound Assessment Granulation tissue;Painful;Yellow   % Wound base Red or Granulating 95%  98%   % Wound base Yellow 5%  2%   Peri-wound Assessment Edema;Induration  callous   Wound Length (cm) 0.3 cm  was .5   Wound Width (cm) 0.3 cm  was .5   Wound Depth (cm) 0.3 cm  was .3   Undermining (cm) .3 perimeter    Drainage Amount Minimal   Treatment Cleansed;Other (Comment)  decongestive techniques    Wound Therapy - Clinical Statement Pt remeasured with contimued decongestion of LE.  PT is ready to be measured for compression wrap.[facility] contacted  Due to obesity pt may do better with juxtafit although I am not sure pt would wear these.     Factors Delaying/Impairing Wound Healing Altered sensation;Diabetes Mellitus;Infection - systemic/local;Immobility;Multiple medical problems;Tobacco use   Hydrotherapy Plan Dressing change;Patient/family education;Other (comment)  manual decongestive techniques followed by compression dress   Wound Therapy -  Frequency --  2x a week for four more weeks until compression garment arrive.    Wound Plan Continue to see for wound care and manual decongestive techniques.     Dressing  medihoney gauze, 2X2, toebandages and profore system   Manual Therapy Manual for bilateral LE's, short neck followed by routing fluid using inguinal/axillary anastomosis anteriorly and LE .  Manual done on both the right and the left side.    Decrease Necrotic Tissue to STG:  2 wk 0%   Decrease Necrotic Tissue - Progress Progressing toward goal   Increase Granulation Tissue to STG: 2 weeks 100%    Increase Granulation Tissue - Progress Progressing toward goal   Decrease Length/Width/Depth by (cm) STG: 2 weeks decrease depth and undermining by .3cm; LTG: 6 weeks:  decease by .7x.4.x.5   Decrease Length/Width/Depth - Progress Progressing toward goal   Improve Drainage Characteristics --  scant   Patient/Family will be able to  STG: 2 weeks understanding of lymphedema and risk of cellulitis;  4 weeK;; Signs and symptoms of cellulitis    Patient/Family Instruction Goal - Progress Met   Additional Wound Therapy Goal STG 3 weeks:  volume to be decreased by 20%; LTG 6 weeks volume to be decreased by 35%  Rt decreased 31%; LT 37%   Additional Wound Therapy Goal - Progress Met   Goals/treatment plan/discharge plan were made with and agreed upon by patient/family Yes   Time For Goal Achievement --  6 weeks    Wound Therapy - Potential for Goals Good                               G-Codes - 06/22/15 1256    Functional Assessment Tool Used volume   Functional Limitation Other PT primary   Other PT Primary Current Status (C3762) At least 60 percent but less than 80 percent impaired, limited or restricted   Other PT Primary Goal Status (G3151) At least 60 percent but less than 80 percent impaired, limited or restricted       Problem List Patient Active Problem List   Diagnosis Date Noted  . Pressure  ulcer 05/02/2015  . Hyponatremia 05/01/2015  . Acute-on-chronic kidney injury ([place]) 05/01/2015  . Diabetes mellitus with hyperglycemia, with long-term current use of insulin ([place]) 05/01/2015  . COPD exacerbation ([place]) 04/30/2015  . Cellulitis of foot, right  03/24/2015  . Elevated d-dimer 03/24/2015  . Peripheral edema 12/04/2014  . Bilateral edema of lower extremity   . Diabetic ulcer of right great toe ([place])   . Foot ulcer due to secondary DM ([place]) 11/07/2014  . Diabetic ulcer of right foot ([place]) 11/07/2014  . Edema of lower extremity 05/27/2013  . Headache 05/26/2013  . CKD (chronic kidney disease), stage III 04/14/2013  . Leukocytosis 04/14/2013  . Anemia in CKD (chronic kidney disease) 04/14/2013  . Dyspnea 04/14/2013  . Chronic diastolic CHF (congestive heart failure) ([place])   . COPD (chronic obstructive pulmonary disease) ([place]) 08/07/2012  . Morbid obesity ([place]) 08/07/2012  . DM type 2 (diabetes mellitus, type 2) ([place]) 08/07/2012  . HTN (hypertension), benign 08/07/2012  . Hypothyroidism (acquired) 08/07/2012    Rayetta Humphrey, PT CLT [phone] 06/15/2015, 12:56 PM  [place], [name], [address] Phone: [phone]   Fax:  [phone]  Name: Stephanie Combs MRN: [id number] Date of Birth: 1950/08/11   Physical Therapy Progress Note  Dates of Reporting Period:05/23/2015 to 06/15/2015  Objective Reports of Subjective Statement: My legs have never been smaller  Objective Measurements: see above  Goal Update: see above  Plan: continue 2x a week for 4 more weeks   Reason Skilled Services are Required: to continue volume reduction and wound healing to reduce risk of infection. Rayetta Humphrey, [place] CLT [phone]

## 2015-06-20 ENCOUNTER — Ambulatory Visit (HOSPITAL_COMMUNITY): Payer: Medicare Other | Admitting: Physical Therapy

## 2015-06-20 DIAGNOSIS — R29898 Other symptoms and signs involving the musculoskeletal system: Secondary | ICD-10-CM | POA: Diagnosis not present

## 2015-06-20 DIAGNOSIS — R269 Unspecified abnormalities of gait and mobility: Secondary | ICD-10-CM

## 2015-06-20 DIAGNOSIS — I890 Lymphedema, not elsewhere classified: Secondary | ICD-10-CM | POA: Diagnosis not present

## 2015-06-20 DIAGNOSIS — R262 Difficulty in walking, not elsewhere classified: Secondary | ICD-10-CM

## 2015-06-20 DIAGNOSIS — T148XXA Other injury of unspecified body region, initial encounter: Secondary | ICD-10-CM

## 2015-06-20 DIAGNOSIS — T148 Other injury of unspecified body region: Secondary | ICD-10-CM | POA: Diagnosis not present

## 2015-06-20 NOTE — Therapy (Signed)
[place], [name], [address] Phone: [phone]   Fax:  [phone]  Wound Care Therapy  Patient Details  Name: Stephanie Combs MRN: [id number] Date of Birth: 25-Aug-1950 No Data Recorded  Encounter Date: 06/20/2015      PT End of Session - 06/20/15 1222    Visit Number 10   Number of Visits 16   Date for PT Re-Evaluation 07/15/15   Authorization Type UHC medicare (gcode completed on visit #9)   Authorization - Visit Number 10  Progress note sent at 9    Authorization - Number of Visits 19   PT Start Time 1324   PT Stop Time 1200   PT Time Calculation (min) 85 min   Activity Tolerance Patient tolerated treatment well   Behavior During Therapy [facility] for tasks assessed/performed      Past Medical History  Diagnosis Date  . Hypertension   . Diabetes mellitus without complication ([place])   . COPD (chronic obstructive pulmonary disease) ([place])   . CHF (congestive heart failure) (H[facility])     Diastolic  . Anxiety   . Chronic back pain   . Hypothyroidism   . Pedal edema   . On home O2     2.5 L N/C prn  . Degenerative disk disease     Past Surgical History  Procedure Laterality Date  . Tubal ligation    . Hernia repair      There were no vitals filed for this visit.  Visit Diagnosis:  Nonhealing nonsurgical wound  Lymphedema  Leg heaviness  Difficulty walking  Abnormal gait                 Wound Therapy - 06/20/15 1213    Subjective Pt states [facility] called but she could not understand what they were saying.  Pt concerned that she will not be able to afford them if her insurance doesnt pay.  Pt without pain.   Patient and Family Stated Goals wound to heal and to not have to wear profore    Date of Onset 08/11/14   Prior Treatments hospitalized, various antibiotics, self care.    Pain Assessment No/denies pain   Wound Properties Date First Assessed: 11/07/14 Location: Toe (Comment   which one) , right great toe  Location Orientation: Right Wound Description (Comments): small open hole under the right grest toe   Dressing Type Compression wrap;Gauze (Comment);Other (Comment)  medihoney gel   Dressing Changed Changed   Dressing Status Clean;Old drainage   Dressing Change Frequency PRN   Site / Wound Assessment Granulation tissue;Painful;Yellow   % Wound base Red or Granulating --  98%   % Wound base Yellow --  2%   Peri-wound Assessment Edema;Induration  callous   Drainage Amount Minimal   Treatment Cleansed;Debridement (Selective)   Selective Debridement - Location Plantar surface of Rt great toe   Selective Debridement - Tools Used Scalpel;Forceps   Selective Debridement - Tissue Removed callous and slough   Wound Therapy - Clinical Statement Great toe with less depth, however thick callous remains perimeter that requires debridement.  Continued debridement to perimeter to increase approximation.  Contacted Brandi at [facility] and scheduled measurement for next Tuesday (2/14) at 12:00.  PT aware and given new scedule.  Also worked on getting appointments moved further away from each other to space out treatments.  continued dressing with medihoney gel, toe bandages and profore system.    Factors Delaying/Impairing Wound  Healing Altered sensation;Diabetes Mellitus;Infection - systemic/local;Immobility;Multiple medical problems;Tobacco use   Hydrotherapy Plan Dressing change;Patient/family education;Other (comment)  manual decongestive techniques followed by compression dress   Wound Therapy - Frequency --  2x a week for two more weeks    Wound Plan Continue to see for wound care and manual decongestive techniques.     Dressing  medihoney gauze, 2X2, toebandages and profore system   Manual Therapy Manual for bilateral LE's, short neck followed by routing fluid using inguinal/axillary anastomosis anteriorly and LE .  Manual done on both the right and the left side.     Decrease Necrotic Tissue to STG:  2 wk 0%   Increase Granulation Tissue to STG: 2 weeks 100%    Decrease Length/Width/Depth by (cm) STG: 2 weeks decrease depth and undermining by .3cm; LTG: 6 weeks:  decease by .7x.4.x.5   Improve Drainage Characteristics --  scant   Patient/Family will be able to  STG: 2 weeks understanding of lymphedema and risk of cellulitis;  4 weeK;; Signs and symptoms of cellulitis    Additional Wound Therapy Goal STG 3 weeks:  volume to be decreased by 20%; LTG 6 weeks volume to be decreased by 35%  Rt decreased 31%; LT 37%   Goals/treatment plan/discharge plan were made with and agreed upon by patient/family Yes   Time For Goal Achievement --  6 weeks    Wound Therapy - Potential for Goals Good                              Problem List Patient Active Problem List   Diagnosis Date Noted  . Pressure ulcer 05/02/2015  . Hyponatremia 05/01/2015  . Acute-on-chronic kidney injury ([place]) 05/01/2015  . Diabetes mellitus with hyperglycemia, with long-term current use of insulin ([place]) 05/01/2015  . COPD exacerbation ([place]) 04/30/2015  . Cellulitis of foot, right 03/24/2015  . Elevated d-dimer 03/24/2015  . Peripheral edema 12/04/2014  . Bilateral edema of lower extremity   . Diabetic ulcer of right great toe ([place])   . Foot ulcer due to secondary DM ([place]) 11/07/2014  . Diabetic ulcer of right foot ([place]) 11/07/2014  . Edema of lower extremity 05/27/2013  . Headache 05/26/2013  . CKD (chronic kidney disease), stage III 04/14/2013  . Leukocytosis 04/14/2013  . Anemia in CKD (chronic kidney disease) 04/14/2013  . Dyspnea 04/14/2013  . Chronic diastolic CHF (congestive heart failure) ([place])   . COPD (chronic obstructive pulmonary disease) ([place]) 08/07/2012  . Morbid obesity ([place]) 08/07/2012  . DM type 2 (diabetes mellitus, type 2) ([place]) 08/07/2012  . HTN (hypertension), benign 08/07/2012  . Hypothyroidism (acquired) 08/07/2012    Teena Irani,  PTA/CLT [phone]  06/20/2015, 12:24 PM  [place], [name], [address] Phone: [phone]   Fax:  [phone]  Name: JEWELENE MAIRENA MRN: [id number] Date of Birth: 12/16/1950

## 2015-06-22 ENCOUNTER — Ambulatory Visit (HOSPITAL_COMMUNITY): Payer: Medicare Other | Admitting: Physical Therapy

## 2015-06-22 DIAGNOSIS — T148XXA Other injury of unspecified body region, initial encounter: Secondary | ICD-10-CM

## 2015-06-22 DIAGNOSIS — R269 Unspecified abnormalities of gait and mobility: Secondary | ICD-10-CM

## 2015-06-22 DIAGNOSIS — R29898 Other symptoms and signs involving the musculoskeletal system: Secondary | ICD-10-CM | POA: Diagnosis not present

## 2015-06-22 DIAGNOSIS — R262 Difficulty in walking, not elsewhere classified: Secondary | ICD-10-CM | POA: Diagnosis not present

## 2015-06-22 DIAGNOSIS — I890 Lymphedema, not elsewhere classified: Secondary | ICD-10-CM | POA: Diagnosis not present

## 2015-06-22 DIAGNOSIS — T148 Other injury of unspecified body region: Secondary | ICD-10-CM | POA: Diagnosis not present

## 2015-06-22 NOTE — Therapy (Signed)
[place], [name], [address] Phone: [phone]   Fax:  [phone]  Wound Care Therapy  Patient Details  Name: Stephanie Combs MRN: [id number] Date of Birth: April 13, 1951 No Data Recorded  Encounter Date: 06/22/2015      PT End of Session - 06/22/15 1156    Visit Number 11   Number of Visits 16   Date for PT Re-Evaluation 07/15/15   Authorization Type UHC medicare (gcode completed on visit #9)   Authorization - Visit Number 11   Authorization - Number of Visits 16   PT Start Time 0935   PT Stop Time 1100   PT Time Calculation (min) 85 min   Activity Tolerance Patient tolerated treatment well   Behavior During Therapy [facility] for tasks assessed/performed      Past Medical History  Diagnosis Date  . Hypertension   . Diabetes mellitus without complication ([place])   . COPD (chronic obstructive pulmonary disease) ([place])   . CHF (congestive heart failure) (H[facility])     Diastolic  . Anxiety   . Chronic back pain   . Hypothyroidism   . Pedal edema   . On home O2     2.5 L N/C prn  . Degenerative disk disease     Past Surgical History  Procedure Laterality Date  . Tubal ligation    . Hernia repair      There were no vitals filed for this visit.  Visit Diagnosis:  Nonhealing nonsurgical wound  Lymphedema  Leg heaviness  Difficulty walking  Abnormal gait                 Wound Therapy - 06/22/15 1146    Subjective Pt states she is going to be measured for her compression garments on Tuesday.  Pt can not remember when her legs have been this small.    Patient and Family Stated Goals wound to heal and to not have to wear profore    Date of Onset 08/11/14   Prior Treatments hospitalized, various antibiotics, self care.    Pain Assessment No/denies pain   Evaluation and Treatment Procedures agreed to   Wound Properties Date First Assessed: 11/07/14 Location: Toe (Comment  which one) , right  great toe  Location Orientation: Right Wound Description (Comments): small open hole under the right grest toe   Dressing Type Compression wrap;Gauze (Comment);Other (Comment)  medihoney gel   Dressing Status Clean;Old drainage   Dressing Change Frequency PRN   Site / Wound Assessment Granulation tissue;Painful;Yellow   % Wound base Red or Granulating 100%  98%   % Wound base Yellow 0%  2%   Peri-wound Assessment Edema  callous   Wound Length (cm) 0.9 cm  increased due to debridement of callous;expose undermining    Wound Width (cm) 0.4 cm   Wound Depth (cm) 0.25 cm   Undermining (cm) .2 medial boarder only.   Margins Attached edges (approximated)   Drainage Amount Minimal   Treatment Cleansed;Debridement (Selective)   Selective Debridement - Location Plantar surface of Rt great toe   Selective Debridement - Tools Used Scalpel;Forceps   Selective Debridement - Tissue Removed callous and slough   Wound Therapy - Clinical Statement Debridement of callous has made wound appear to be bigger when in reality there was undermining under the callous.  The wound continues to improve in depth which is the most important aspect.  Pt continues to have undermining along the medial boarder of  the wound but this is much less than before   Factors Delaying/Impairing Wound Healing Altered sensation;Diabetes Mellitus;Infection - systemic/local;Immobility;Multiple medical problems;Tobacco use   Hydrotherapy Plan Dressing change;Patient/family education;Other (comment)  manual decongestive techniques followed by compression dress   Wound Therapy - Frequency --  Continue 2x a week for 4 weeks until garments come in.    Wound Plan Continue to see for wound care and manual decongestive techniques.     Dressing  medihoney gauze, 2X2, toebandages and profore system   Manual Therapy Manual for bilateral LE's, short neck followed by routing fluid using inguinal/axillary anastomosis anteriorly and LE .  Manual  done on both the right and the left side.    Decrease Necrotic Tissue to STG:  2 wk 0%   Decrease Necrotic Tissue - Progress Met   Increase Granulation Tissue to STG: 2 weeks 100%    Increase Granulation Tissue - Progress Met   Decrease Length/Width/Depth by (cm) STG: 2 weeks decrease depth and undermining by .3cm; LTG: 6 weeks:  decease by .7x.4.x.5   Decrease Length/Width/Depth - Progress Progressing toward goal   Improve Drainage Characteristics --  scant   Improve Drainage Characteristics - Progress Progressing toward goal   Patient/Family will be able to  STG: 2 weeks understanding of lymphedema and risk of cellulitis;  4 weeK;; Signs and symptoms of cellulitis    Additional Wound Therapy Goal STG 3 weeks:  volume to be decreased by 20%; LTG 6 weeks volume to be decreased by 35%  Rt decreased 31%; LT 37%   Additional Wound Therapy Goal - Progress Met   Goals/treatment plan/discharge plan were made with and agreed upon by patient/family Yes   Time For Goal Achievement --  6 weeks    Wound Therapy - Potential for Goals Good           06/22/15 1146  Wound Therapy Goals - Improve the function of patient's integumentary system by progressing the wound(s) through the phases of wound healing by:  Decrease Necrotic Tissue to STG:  2 wk 0%  Decrease Necrotic Tissue - Progress Met  Increase Granulation Tissue to STG: 2 weeks 100%   Increase Granulation Tissue - Progress Met  Decrease Length/Width/Depth by (cm) STG: 2 weeks decrease depth and undermining by .3cm; LTG: 6 weeks:  decease by .7x.4.x.5  Decrease Length/Width/Depth - Progress Progressing toward goal  Improve Drainage Characteristics (scant)  Improve Drainage Characteristics - Progress Progressing toward goal  Patient/Family will be able to  STG: 2 weeks understanding of lymphedema and risk of cellulitis;  4 weeK;; Signs and symptoms of cellulitis   Additional Wound Therapy Goal STG 3 weeks:  volume to be decreased by 20%; LTG  6 weeks volume to be decreased by 35% (Rt decreased 31%; LT 37%)  Additional Wound Therapy Goal - Progress Met  Goals/treatment plan/discharge plan were made with and agreed upon by patient/family Yes  Time For Goal Achievement (6 weeks )  Wound Therapy - Potential for Goals Good                        Problem List Patient Active Problem List   Diagnosis Date Noted  . Pressure ulcer 05/02/2015  . Hyponatremia 05/01/2015  . Acute-on-chronic kidney injury ([place]) 05/01/2015  . Diabetes mellitus with hyperglycemia, with long-term current use of insulin ([place]) 05/01/2015  . COPD exacerbation ([place]) 04/30/2015  . Cellulitis of foot, right 03/24/2015  . Elevated d-dimer 03/24/2015  . Peripheral edema 12/04/2014  .  Bilateral edema of lower extremity   . Diabetic ulcer of right great toe ([place])   . Foot ulcer due to secondary DM ([place]) 11/07/2014  . Diabetic ulcer of right foot ([place]) 11/07/2014  . Edema of lower extremity 05/27/2013  . Headache 05/26/2013  . CKD (chronic kidney disease), stage III 04/14/2013  . Leukocytosis 04/14/2013  . Anemia in CKD (chronic kidney disease) 04/14/2013  . Dyspnea 04/14/2013  . Chronic diastolic CHF (congestive heart failure) ([place])   . COPD (chronic obstructive pulmonary disease) ([place]) 08/07/2012  . Morbid obesity ([place]) 08/07/2012  . DM type 2 (diabetes mellitus, type 2) ([place]) 08/07/2012  . HTN (hypertension), benign 08/07/2012  . Hypothyroidism (acquired) 08/07/2012  Rayetta Humphrey, PT CLT [phone] 06/22/2015, 11:59 AM  [place] [address] [place], [name], [address] Phone: [phone]   Fax:  [phone]  Name: Stephanie Combs MRN: [id number] Date of Birth: 1951/04/02

## 2015-06-26 ENCOUNTER — Telehealth (HOSPITAL_COMMUNITY): Payer: Self-pay | Admitting: Physical Therapy

## 2015-06-26 ENCOUNTER — Ambulatory Visit (HOSPITAL_COMMUNITY): Payer: Medicare Other | Admitting: Physical Therapy

## 2015-06-26 NOTE — Telephone Encounter (Signed)
Pt did not show for 12:00 appointment with [place] from [facility] to measure for garments.  At 12:15 pm patient was called and stated her appointment was not until 1:00 and she was at her hair appointment.  Garment fitter left at 12:30 for another appointment.  Tried to contact patient X 2 more attempts to inform her that she would need to reschedule.  Pt and CG showed at 13:00 and was informed that appointment had been cancelled.  CG [place] [phone]) was given information regarding contacting [facility] to reschedule her garment appointment.  Both were instructed of next appointment at clinic on 2/15 at 2:30 pm.  Teena Irani, PTA/CLT [phone]

## 2015-06-27 ENCOUNTER — Ambulatory Visit (HOSPITAL_COMMUNITY): Payer: Medicare Other | Admitting: Physical Therapy

## 2015-06-27 DIAGNOSIS — R29898 Other symptoms and signs involving the musculoskeletal system: Secondary | ICD-10-CM | POA: Diagnosis not present

## 2015-06-27 DIAGNOSIS — T148XXA Other injury of unspecified body region, initial encounter: Secondary | ICD-10-CM

## 2015-06-27 DIAGNOSIS — I890 Lymphedema, not elsewhere classified: Secondary | ICD-10-CM | POA: Diagnosis not present

## 2015-06-27 DIAGNOSIS — T148 Other injury of unspecified body region: Secondary | ICD-10-CM | POA: Diagnosis not present

## 2015-06-27 DIAGNOSIS — R269 Unspecified abnormalities of gait and mobility: Secondary | ICD-10-CM | POA: Diagnosis not present

## 2015-06-27 DIAGNOSIS — R262 Difficulty in walking, not elsewhere classified: Secondary | ICD-10-CM | POA: Diagnosis not present

## 2015-06-27 NOTE — Therapy (Signed)
[place], [name], [address] Phone: [phone]   Fax:  [phone]  Wound Care Therapy  Patient Details  Name: Stephanie Combs MRN: [id number] Date of Birth: 04-25-1951 No Data Recorded  Encounter Date: 06/27/2015      PT End of Session - 06/27/15 1604    Visit Number 12   Number of Visits 16   Date for PT Re-Evaluation 07/15/15   Authorization Type UHC medicare (gcode completed on visit #9)   Authorization - Visit Number 12   Authorization - Number of Visits 16   PT Start Time 1430   PT Stop Time 1550   PT Time Calculation (min) 80 min   Activity Tolerance Patient tolerated treatment well   Behavior During Therapy [facility] for tasks assessed/performed      Past Medical History  Diagnosis Date  . Hypertension   . Diabetes mellitus without complication ([place])   . COPD (chronic obstructive pulmonary disease) ([place])   . CHF (congestive heart failure) (H[facility])     Diastolic  . Anxiety   . Chronic back pain   . Hypothyroidism   . Pedal edema   . On home O2     2.5 L N/C prn  . Degenerative disk disease     Past Surgical History  Procedure Laterality Date  . Tubal ligation    . Hernia repair      There were no vitals filed for this visit.  Visit Diagnosis:  Nonhealing nonsurgical wound  Lymphedema  Leg heaviness  Difficulty walking                 Wound Therapy - 06/27/15 1556    Subjective PT states she missed her appt yesterday and is suppose to be fitted tomorrow at her house.  Reminded patient she would have to remove her bandages herself and she verbalized understanding and ability to complete task.    Patient and Family Stated Goals wound to heal and to not have to wear profore    Date of Onset 08/11/14   Prior Treatments hospitalized, various antibiotics, self care.    Pain Assessment No/denies pain   Evaluation and Treatment Procedures agreed to   Wound Properties Date First  Assessed: 11/07/14 Location: Toe (Comment  which one) , right great toe  Location Orientation: Right Wound Description (Comments): small open hole under the right grest toe   Dressing Type Compression wrap;Gauze (Comment);Other (Comment)  medihoney gel   Dressing Changed Changed   Dressing Status Clean;Old drainage   Dressing Change Frequency PRN   Site / Wound Assessment Granulation tissue;Painful;Yellow   % Wound base Red or Granulating 100%  98%   % Wound base Yellow 0%  2%   Peri-wound Assessment Edema  callous   Margins Attached edges (approximated)   Drainage Amount Minimal   Drainage Description Serosanguineous   Treatment Cleansed;Debridement (Selective)   Selective Debridement - Location Plantar surface of Rt great toe   Selective Debridement - Tools Used Scalpel;Forceps   Selective Debridement - Tissue Removed callous and slough   Wound Therapy - Clinical Statement Continued thick callous on plantar surface and perimeter of wound.  Debrided away layers using scapel and moisturized well prior to redressing wtih medihoney gel/toebandages.  Profore system used bilateral LE's following cleansing and moisturizing.  Pt reported comfort of dressings in bilateral LE's at end of session.     Factors Delaying/Impairing Wound Healing Altered sensation;Diabetes Mellitus;Infection - systemic/local;Immobility;Multiple medical problems;Tobacco use  Hydrotherapy Plan Dressing change;Patient/family education;Other (comment)  manual decongestive techniques followed by compression dress   Wound Therapy - Frequency --  Continue 2x a week for 4 weeks until garments come in.    Wound Plan Continue to see for wound care and manual decongestive techniques.     Dressing  medihoney gauze, 2X2, toebandages and profore system   Manual Therapy Manual for bilateral LE's, short neck followed by routing fluid using inguinal/axillary anastomosis anteriorly and LE .  Manual done on both the right and the left  side.    Decrease Necrotic Tissue to STG:  2 wk 0%   Increase Granulation Tissue to STG: 2 weeks 100%    Decrease Length/Width/Depth by (cm) STG: 2 weeks decrease depth and undermining by .3cm; LTG: 6 weeks:  decease by .7x.4.x.5   Improve Drainage Characteristics --  scant   Patient/Family will be able to  STG: 2 weeks understanding of lymphedema and risk of cellulitis;  4 weeK;; Signs and symptoms of cellulitis    Additional Wound Therapy Goal STG 3 weeks:  volume to be decreased by 20%; LTG 6 weeks volume to be decreased by 35%  Rt decreased 31%; LT 37%   Goals/treatment plan/discharge plan were made with and agreed upon by patient/family Yes   Time For Goal Achievement --  6 weeks    Wound Therapy - Potential for Goals Good                              Problem List Patient Active Problem List   Diagnosis Date Noted  . Pressure ulcer 05/02/2015  . Hyponatremia 05/01/2015  . Acute-on-chronic kidney injury ([place]) 05/01/2015  . Diabetes mellitus with hyperglycemia, with long-term current use of insulin ([place]) 05/01/2015  . COPD exacerbation ([place]) 04/30/2015  . Cellulitis of foot, right 03/24/2015  . Elevated d-dimer 03/24/2015  . Peripheral edema 12/04/2014  . Bilateral edema of lower extremity   . Diabetic ulcer of right great toe ([place])   . Foot ulcer due to secondary DM ([place]) 11/07/2014  . Diabetic ulcer of right foot ([place]) 11/07/2014  . Edema of lower extremity 05/27/2013  . Headache 05/26/2013  . CKD (chronic kidney disease), stage III 04/14/2013  . Leukocytosis 04/14/2013  . Anemia in CKD (chronic kidney disease) 04/14/2013  . Dyspnea 04/14/2013  . Chronic diastolic CHF (congestive heart failure) ([place])   . COPD (chronic obstructive pulmonary disease) ([place]) 08/07/2012  . Morbid obesity ([place]) 08/07/2012  . DM type 2 (diabetes mellitus, type 2) ([place]) 08/07/2012  . HTN (hypertension), benign 08/07/2012  . Hypothyroidism (acquired) 08/07/2012    Teena Irani, PTA/CLT [phone]  06/27/2015, 4:06 PM  [place], [name], [address] Phone: [phone]   Fax:  [phone]  Name: BERKLEY WRIGHTSMAN MRN: [id number] Date of Birth: 10-03-1950

## 2015-06-29 ENCOUNTER — Ambulatory Visit (HOSPITAL_COMMUNITY): Payer: Medicare Other | Admitting: Physical Therapy

## 2015-06-29 DIAGNOSIS — R29898 Other symptoms and signs involving the musculoskeletal system: Secondary | ICD-10-CM

## 2015-06-29 DIAGNOSIS — T148XXA Other injury of unspecified body region, initial encounter: Secondary | ICD-10-CM

## 2015-06-29 DIAGNOSIS — R269 Unspecified abnormalities of gait and mobility: Secondary | ICD-10-CM | POA: Diagnosis not present

## 2015-06-29 DIAGNOSIS — R262 Difficulty in walking, not elsewhere classified: Secondary | ICD-10-CM

## 2015-06-29 DIAGNOSIS — T148 Other injury of unspecified body region: Secondary | ICD-10-CM | POA: Diagnosis not present

## 2015-06-29 DIAGNOSIS — I890 Lymphedema, not elsewhere classified: Secondary | ICD-10-CM

## 2015-06-29 NOTE — Therapy (Signed)
[place], [name], [address] Phone: [phone]   Fax:  [phone]  Wound Care Therapy  Patient Details  Name: Stephanie Combs MRN: [id number] Date of Birth: 1950-10-15 No Data Recorded  Encounter Date: 06/29/2015      PT End of Session - 06/29/15 1626    Visit Number 13   Number of Visits 16   Date for PT Re-Evaluation 07/15/15   Authorization - Visit Number 13   Authorization - Number of Visits 16   PT Start Time 1400  Pt was late for appointment   PT Stop Time 1514   PT Time Calculation (min) 74 min   Activity Tolerance Patient tolerated treatment well      Past Medical History  Diagnosis Date  . Hypertension   . Diabetes mellitus without complication ([place])   . COPD (chronic obstructive pulmonary disease) ([place])   . CHF (congestive heart failure) (H[facility])     Diastolic  . Anxiety   . Chronic back pain   . Hypothyroidism   . Pedal edema   . On home O2     2.5 L N/C prn  . Degenerative disk disease     Past Surgical History  Procedure Laterality Date  . Tubal ligation    . Hernia repair      There were no vitals filed for this visit.  Visit Diagnosis:  Lymphedema  Nonhealing nonsurgical wound  Leg heaviness  Difficulty walking  Abnormal gait      Subjective Assessment - 06/29/15 1615    Subjective Pt states that she is going to be fitted for her compression stocking on Wednesday.  Pt states she has been having some throbbing in her toe but it feels alright now.   Currently in Pain? No/denies                   Wound Therapy - 06/29/15 1617    Subjective PT states that she is going to be fitted for her compression stockings on Wednesday.   Patient and Family Stated Goals wound to heal and to not have to wear profore    Date of Onset 08/11/14   Prior Treatments hospitalized, various antibiotics, self care.    Pain Assessment No/denies pain   Evaluation and Treatment  Procedures agreed to   Wound Properties Date First Assessed: 11/07/14 Location: Toe (Comment  which one) , right great toe  Location Orientation: Right Wound Description (Comments): small open hole under the right grest toe   Dressing Type Compression wrap;Gauze (Comment);Other (Comment)  medihoney gel   Dressing Changed Changed   Dressing Status Clean;Old drainage   Dressing Change Frequency PRN   Site / Wound Assessment Granulation tissue;Painful;Yellow   % Wound base Red or Granulating 100%  98%   % Wound base Yellow 0%  2%   Peri-wound Assessment Edema  callous   Wound Length (cm) 0.8 cm   Wound Width (cm) 0.5 cm   Wound Depth (cm) 0.4 cm   Undermining (cm) .2 medical aspect    Margins Attached edges (approximated)   Drainage Amount Minimal   Drainage Description Serosanguineous   Treatment Other (Comment)   Selective Debridement - Location Plantar surface of Rt great toe   Selective Debridement - Tools Used Scalpel;Forceps   Selective Debridement - Tissue Removed callous and slough   Wound Therapy - Clinical Statement Pt wound appears to be deeper. Pt has been up on her LE more than  usual.  Pt encouraged to stay off of her foot as much as possible.    Factors Delaying/Impairing Wound Healing Altered sensation;Diabetes Mellitus;Infection - systemic/local;Immobility;Multiple medical problems;Tobacco use   Hydrotherapy Plan Dressing change;Patient/family education;Other (comment)  manual decongestive techniques followed by compression dress   Wound Therapy - Frequency --  Continue 2x a week for 4 weeks until garments come in.    Wound Plan manual consisit of supraclavicular deep and superficial abdominal, routing fluid using the inguinal/axillary anastomosis as well as LE anteriorly. If wound is not improving change to silver dressing instead of medihoney.    Dressing  medihoney gauze, 2X2, toebandages and profore system   Manual Therapy Manual for bilateral LE's, short neck  followed by routing fluid using inguinal/axillary anastomosis anteriorly and LE .  Manual done on both the right and the left side.    Decrease Necrotic Tissue to STG:  2 wk 0%   Decrease Necrotic Tissue - Progress Met   Increase Granulation Tissue to STG: 2 weeks 100%    Increase Granulation Tissue - Progress Met   Decrease Length/Width/Depth by (cm) STG: 2 weeks decrease depth and undermining by .3cm; LTG: 6 weeks:  decease by .7x.4.x.5   Decrease Length/Width/Depth - Progress Progressing toward goal   Improve Drainage Characteristics --  scant   Patient/Family will be able to  STG: 2 weeks understanding of lymphedema and risk of cellulitis;  4 weeK;; Signs and symptoms of cellulitis    Patient/Family Instruction Goal - Progress Met   Additional Wound Therapy Goal STG 3 weeks:  volume to be decreased by 20%; LTG 6 weeks volume to be decreased by 35%  Rt decreased 31%; LT 37%   Additional Wound Therapy Goal - Progress Met   Goals/treatment plan/discharge plan were made with and agreed upon by patient/family Yes   Time For Goal Achievement --  6 weeks    Wound Therapy - Potential for Goals Good                              Problem List Patient Active Problem List   Diagnosis Date Noted  . Pressure ulcer 05/02/2015  . Hyponatremia 05/01/2015  . Acute-on-chronic kidney injury ([place]) 05/01/2015  . Diabetes mellitus with hyperglycemia, with long-term current use of insulin ([place]) 05/01/2015  . COPD exacerbation ([place]) 04/30/2015  . Cellulitis of foot, right 03/24/2015  . Elevated d-dimer 03/24/2015  . Peripheral edema 12/04/2014  . Bilateral edema of lower extremity   . Diabetic ulcer of right great toe ([place])   . Foot ulcer due to secondary DM ([place]) 11/07/2014  . Diabetic ulcer of right foot ([place]) 11/07/2014  . Edema of lower extremity 05/27/2013  . Headache 05/26/2013  . CKD (chronic kidney disease), stage III 04/14/2013  . Leukocytosis 04/14/2013  . Anemia  in CKD (chronic kidney disease) 04/14/2013  . Dyspnea 04/14/2013  . Chronic diastolic CHF (congestive heart failure) ([place])   . COPD (chronic obstructive pulmonary disease) ([place]) 08/07/2012  . Morbid obesity ([place]) 08/07/2012  . DM type 2 (diabetes mellitus, type 2) ([place]) 08/07/2012  . HTN (hypertension), benign 08/07/2012  . Hypothyroidism (acquired) 08/07/2012    Rayetta Humphrey, PT CLT [phone] 06/29/2015, 4:28 PM  [facility] [address] [place], [name], [address] Phone: [phone]   Fax:  [phone]  Name: Stephanie Combs MRN: [id number] Date of Birth: [birth year]

## 2015-07-04 ENCOUNTER — Encounter (HOSPITAL_COMMUNITY): Payer: Self-pay | Admitting: Physical Therapy

## 2015-07-04 ENCOUNTER — Ambulatory Visit (HOSPITAL_COMMUNITY): Payer: Medicare Other | Admitting: Physical Therapy

## 2015-07-04 DIAGNOSIS — R269 Unspecified abnormalities of gait and mobility: Secondary | ICD-10-CM

## 2015-07-04 DIAGNOSIS — R29898 Other symptoms and signs involving the musculoskeletal system: Secondary | ICD-10-CM | POA: Diagnosis not present

## 2015-07-04 DIAGNOSIS — I890 Lymphedema, not elsewhere classified: Secondary | ICD-10-CM | POA: Diagnosis not present

## 2015-07-04 DIAGNOSIS — T148 Other injury of unspecified body region: Secondary | ICD-10-CM | POA: Diagnosis not present

## 2015-07-04 DIAGNOSIS — J449 Chronic obstructive pulmonary disease, unspecified: Secondary | ICD-10-CM | POA: Diagnosis not present

## 2015-07-04 DIAGNOSIS — R262 Difficulty in walking, not elsewhere classified: Secondary | ICD-10-CM | POA: Diagnosis not present

## 2015-07-04 DIAGNOSIS — T148XXA Other injury of unspecified body region, initial encounter: Secondary | ICD-10-CM

## 2015-07-04 NOTE — Therapy (Signed)
[place], [name], [address] Phone: [phone]   Fax:  [phone]  Wound Care Therapy  Patient Details  Name: Stephanie Combs MRN: [id number] Date of Birth: [birth year] No Data Recorded  Encounter Date: 07/04/2015      PT End of Session - 07/04/15 1549    Visit Number 14   Number of Visits 16   Date for PT Re-Evaluation 07/15/15   Authorization Type UHC medicare (gcode completed on visit #9)   Authorization - Visit Number 13   Authorization - Number of Visits 16   PT Start Time 1301   PT Stop Time 1500   PT Time Calculation (min) 119 min   Activity Tolerance Patient tolerated treatment well   Behavior During Therapy [facility] for tasks assessed/performed      Past Medical History  Diagnosis Date  . Hypertension   . Diabetes mellitus without complication ([place])   . COPD (chronic obstructive pulmonary disease) ([place])   . CHF (congestive heart failure) (H[facility])     Diastolic  . Anxiety   . Chronic back pain   . Hypothyroidism   . Pedal edema   . On home O2     2.5 L N/C prn  . Degenerative disk disease     Past Surgical History  Procedure Laterality Date  . Tubal ligation    . Hernia repair      There were no vitals filed for this visit.  Visit Diagnosis:  Lymphedema  Nonhealing nonsurgical wound  Leg heaviness  Difficulty walking  Abnormal gait      Subjective Assessment - 07/04/15 1547    Subjective Pt states she is tired today. She also states she will be fitted for her compression garments at 2:30.    Currently in Pain? No/denies                   Wound Therapy - 07/04/15 1535    Subjective Pt states she is tired today.    Patient and Family Stated Goals wound to heal and to not have to wear profore    Date of Onset 08/11/14   Prior Treatments hospitalized, various antibiotics, self care.    Pain Assessment No/denies pain   Evaluation and Treatment Procedures agreed to   Wound Properties Date First Assessed: 11/07/14 Location: Toe (Comment  which one) , right great toe  Location Orientation: Right Wound Description (Comments): small open hole under the right grest toe   Dressing Type Compression wrap;Gauze (Comment);Other (Comment)   Dressing Changed Other (Comment)   Dressing Status Clean  dressing not intact but wound appeared cleared.   Dressing Change Frequency PRN   Site / Wound Assessment Granulation tissue;Yellow;Dry   Wound Therapy - Clinical Statement Wound appears to be healing. Pt arrived to appointment with wound dressing and toe bandages not intact. Pt unable to state when wound dressing came off. Pt was measured for bilateral LE compression garments (nighttime and daytime).    Factors Delaying/Impairing Wound Healing Altered sensation;Diabetes Mellitus;Infection - systemic/local;Immobility;Multiple medical problems;Tobacco use   Hydrotherapy Plan Dressing change;Patient/family education;Other (comment)  manual decongestive techniques followed by compression dress   Wound Plan manual consisit of supraclavicular deep and superficial abdominal, routing fluid using the inguinal/axillary anastomosis as well as LE anteriorly. If wound is not improving change to silver dressing instead of medihoney.    Manual Therapy Manual for bilateral LE's, short neck followed by deep abdominal, then stimulation of axillary lymphnodes routing  fluid using inguinal/axillary anastomosis anteriorly and LE .  Manual done on both the right and the left side.    Wound Therapy - Potential for Goals Good                 PT Education - 07/04/15 1548    Education provided Yes   Education Details Discussed various types of compression garments and benefits of compression garments   Person(s) Educated Patient   Methods Explanation;Demonstration   Comprehension Verbalized understanding                     Problem List Patient Active Problem List    Diagnosis Date Noted  . Pressure ulcer 05/02/2015  . Hyponatremia 05/01/2015  . Acute-on-chronic kidney injury ([place]) 05/01/2015  . Diabetes mellitus with hyperglycemia, with long-term current use of insulin ([place]) 05/01/2015  . COPD exacerbation ([place]) 04/30/2015  . Cellulitis of foot, right 03/24/2015  . Elevated d-dimer 03/24/2015  . Peripheral edema 12/04/2014  . Bilateral edema of lower extremity   . Diabetic ulcer of right great toe ([place])   . Foot ulcer due to secondary DM ([place]) 11/07/2014  . Diabetic ulcer of right foot ([place]) 11/07/2014  . Edema of lower extremity 05/27/2013  . Headache 05/26/2013  . CKD (chronic kidney disease), stage III 04/14/2013  . Leukocytosis 04/14/2013  . Anemia in CKD (chronic kidney disease) 04/14/2013  . Dyspnea 04/14/2013  . Chronic diastolic CHF (congestive heart failure) ([place])   . COPD (chronic obstructive pulmonary disease) ([place]) 08/07/2012  . Morbid obesity ([place]) 08/07/2012  . DM type 2 (diabetes mellitus, type 2) ([place]) 08/07/2012  . HTN (hypertension), benign 08/07/2012  . Hypothyroidism (acquired) 08/07/2012    Teena Irani, PTA/CLT [phone]  07/04/2015, 3:55 PM  [facility] [address] [place], [name], [address] Phone: [phone]   Fax:  [phone]  Name: Stephanie Combs MRN: [id number] Date of Birth: 27-Dec-1950

## 2015-07-06 ENCOUNTER — Ambulatory Visit (HOSPITAL_COMMUNITY): Payer: Medicare Other | Admitting: Physical Therapy

## 2015-07-06 DIAGNOSIS — I890 Lymphedema, not elsewhere classified: Secondary | ICD-10-CM

## 2015-07-06 DIAGNOSIS — R29898 Other symptoms and signs involving the musculoskeletal system: Secondary | ICD-10-CM

## 2015-07-06 DIAGNOSIS — T148XXA Other injury of unspecified body region, initial encounter: Secondary | ICD-10-CM

## 2015-07-06 DIAGNOSIS — R262 Difficulty in walking, not elsewhere classified: Secondary | ICD-10-CM

## 2015-07-06 DIAGNOSIS — R269 Unspecified abnormalities of gait and mobility: Secondary | ICD-10-CM | POA: Diagnosis not present

## 2015-07-06 DIAGNOSIS — T148 Other injury of unspecified body region: Secondary | ICD-10-CM | POA: Diagnosis not present

## 2015-07-06 NOTE — Progress Notes (Signed)
   07/06/15 1521  Wound Therapy Goals - Improve the function of patient's integumentary system by progressing the wound(s) through the phases of wound healing by:  Decrease Necrotic Tissue to STG:  2 wk 0%  Decrease Necrotic Tissue - Progress Met  Increase Granulation Tissue to STG: 2 weeks 100%   Increase Granulation Tissue - Progress Met  Decrease Length/Width/Depth by (cm) STG: 2 weeks decrease depth and undermining by .3cm; LTG: 6 weeks:  decease by .7x.4.x.5  Decrease Length/Width/Depth - Progress Progressing toward goal  Improve Drainage Characteristics (scant)  Patient/Family will be able to  STG: 2 weeks understanding of lymphedema and risk of cellulitis;  4 weeK;; Signs and symptoms of cellulitis   Patient/Family Instruction Goal - Progress Met  Additional Wound Therapy Goal STG 3 weeks:  volume to be decreased by 20%; LTG 6 weeks volume to be decreased by 35% (Rt decreased 31%; LT 37%)  Additional Wound Therapy Goal - Progress Met  Goals/treatment plan/discharge plan were made with and agreed upon by patient/family Yes  Time For Goal Achievement (6 weeks )  Wound Therapy - Potential for Goals Good

## 2015-07-06 NOTE — Therapy (Signed)
[place], [name], [address] Phone: [phone]   Fax:  [phone]  Wound Care Therapy  Patient Details  Name: Stephanie Combs MRN: [id number] Date of Birth: 1950-08-26 No Data Recorded  Encounter Date: 07/06/2015      PT End of Session - 07/06/15 1529    Visit Number 15   Number of Visits 16   Date for PT Re-Evaluation 07/15/15   Authorization Type UHC medicare (gcode completed on visit #9)   Authorization - Visit Number 15   Authorization - Number of Visits 16   PT Start Time 1400   PT Stop Time 1516   PT Time Calculation (min) 76 min   Activity Tolerance Patient tolerated treatment well      Past Medical History  Diagnosis Date  . Hypertension   . Diabetes mellitus without complication ([place])   . COPD (chronic obstructive pulmonary disease) ([place])   . CHF (congestive heart failure) ([facility])     Diastolic  . Anxiety   . Chronic back pain   . Hypothyroidism   . Pedal edema   . On home O2     2.5 L N/C prn  . Degenerative disk disease     Past Surgical History  Procedure Laterality Date  . Tubal ligation    . Hernia repair      There were no vitals filed for this visit.  Visit Diagnosis:  Lymphedema  Nonhealing nonsurgical wound  Leg heaviness  Difficulty walking  Abnormal gait                 Wound Therapy - 07/06/15 1521    Subjective Pt states that she about passed out in the parking lot.  States that her blood sugar feels as though it is dropping. Pt given coke and cheese nips to eat.   Patient and Family Stated Goals wound to heal and to not have to wear profore    Date of Onset 08/11/14   Prior Treatments hospitalized, various antibiotics, self care.    Pain Assessment No/denies pain   Evaluation and Treatment Procedures agreed to   Wound Properties Date First Assessed: 11/07/14 Location: Toe (Comment  which one) , right great toe  Location Orientation: Right Wound  Description (Comments): small open hole under the right grest toe   Dressing Type Compression wrap;Gauze (Comment);Other (Comment)   Dressing Changed Changed   Dressing Status Clean  dressing not intact but wound appeared cleared.   Dressing Change Frequency PRN   Site / Wound Assessment Granulation tissue;Yellow;Dry   % Wound base Red or Granulating 100%   % Wound base Yellow 0%   Peri-wound Assessment Other (Comment)  calloused   Undermining (cm) .2 cm    Margins Attached edges (approximated)   Drainage Amount Scant   Drainage Description Serous   Treatment Cleansed;Debridement (Selective);Other (Comment)  manual techniques for decongestion   Selective Debridement - Location callous on plantar aspect of 1st MTP   Selective Debridement - Tools Used Forceps;Scissors   Selective Debridement - Tissue Removed callous    Wound Therapy - Clinical Statement Pt leg was not measured today secondary to pt being late and then therapist had to wait for pt to consume beverage and crackers due to low blood sugar.  Will measure next treatment.  Pt has no induration and forefoot appears to have decreased in edema in both LE>    Wound Therapy - Functional Problem List Live impact score 84/90(impacting  life severly)   Factors Delaying/Impairing Wound Healing Altered sensation;Diabetes Mellitus;Infection - systemic/local;Immobility;Multiple medical problems;Tobacco use   Hydrotherapy Plan Dressing change;Patient/family education;Other (comment)  manual decongestive techniques followed by compression dress   Wound Plan manual consisit of supraclavicular deep and superficial abdominal, routing fluid using the inguinal/axillary anastomosis as well as LE anteriorly. If wound is not improving change to silver dressing instead of medihoney.    Dressing  vaseline to callous, medihoney to wound followed by 2x2 and toe wrap.  Pt then dressed with layered compression system,(profore) bilaterally, netting to prevent  coban from sticking to pants.    Manual Therapy Manual for bilateral LE's, short neck followed by deep abdominal, then stimulation of axillary lymphnodes routing fluid using inguinal/axillary anastomosis anteriorly and LE .  Manual done on both the right and the left side.    Decrease Necrotic Tissue to STG:  2 wk 0%   Decrease Necrotic Tissue - Progress Met   Increase Granulation Tissue to STG: 2 weeks 100%    Increase Granulation Tissue - Progress Met   Decrease Length/Width/Depth by (cm) STG: 2 weeks decrease depth and undermining by .3cm; LTG: 6 weeks:  decease by .7x.4.x.5   Decrease Length/Width/Depth - Progress Progressing toward goal   Improve Drainage Characteristics --  scant   Patient/Family will be able to  STG: 2 weeks understanding of lymphedema and risk of cellulitis;  4 weeK;; Signs and symptoms of cellulitis    Patient/Family Instruction Goal - Progress Met   Additional Wound Therapy Goal STG 3 weeks:  volume to be decreased by 20%; LTG 6 weeks volume to be decreased by 35%  Rt decreased 31%; LT 37%   Additional Wound Therapy Goal - Progress Met   Goals/treatment plan/discharge plan were made with and agreed upon by patient/family Yes   Time For Goal Achievement --  6 weeks    Wound Therapy - Potential for Goals Good            07/06/15 1521  Wound Therapy Goals - Improve the function of patient's integumentary system by progressing the wound(s) through the phases of wound healing by:  Decrease Necrotic Tissue to STG:  2 wk 0%  Decrease Necrotic Tissue - Progress Met  Increase Granulation Tissue to STG: 2 weeks 100%   Increase Granulation Tissue - Progress Met  Decrease Length/Width/Depth by (cm) STG: 2 weeks decrease depth and undermining by .3cm; LTG: 6 weeks:  decease by .7x.4.x.5  Decrease Length/Width/Depth - Progress Progressing toward goal  Improve Drainage Characteristics (scant)  Patient/Family will be able to  STG: 2 weeks understanding of lymphedema and  risk of cellulitis;  4 weeK;; Signs and symptoms of cellulitis   Patient/Family Instruction Goal - Progress Met  Additional Wound Therapy Goal STG 3 weeks:  volume to be decreased by 20%; LTG 6 weeks volume to be decreased by 35% (Rt decreased 31%; LT 37%)  Additional Wound Therapy Goal - Progress Met  Goals/treatment plan/discharge plan were made with and agreed upon by patient/family Yes  Time For Goal Achievement (6 weeks )  Wound Therapy - Potential for Goals Good                        Problem List Patient Active Problem List   Diagnosis Date Noted  . Pressure ulcer 05/02/2015  . Hyponatremia 05/01/2015  . Acute-on-chronic kidney injury ([place]) 05/01/2015  . Diabetes mellitus with hyperglycemia, with long-term current use of insulin ([place]) 05/01/2015  . COPD  exacerbation ([place]) 04/30/2015  . Cellulitis of foot, right 03/24/2015  . Elevated d-dimer 03/24/2015  . Peripheral edema 12/04/2014  . Bilateral edema of lower extremity   . Diabetic ulcer of right great toe ([place])   . Foot ulcer due to secondary DM ([place]) 11/07/2014  . Diabetic ulcer of right foot ([place]) 11/07/2014  . Edema of lower extremity 05/27/2013  . Headache 05/26/2013  . CKD (chronic kidney disease), stage III 04/14/2013  . Leukocytosis 04/14/2013  . Anemia in CKD (chronic kidney disease) 04/14/2013  . Dyspnea 04/14/2013  . Chronic diastolic CHF (congestive heart failure) ([place])   . COPD (chronic obstructive pulmonary disease) ([place]) 08/07/2012  . Morbid obesity ([place]) 08/07/2012  . DM type 2 (diabetes mellitus, type 2) ([place]) 08/07/2012  . HTN (hypertension), benign 08/07/2012  . Hypothyroidism (acquired) 08/07/2012   Rayetta Humphrey, PT CLT [phone] 07/06/2015, 3:31 PM  [facility] [address] [place], [name], [address] Phone: [phone]   Fax:  [phone]  Name: Stephanie Combs MRN: [id number] Date of Birth: [birth year]

## 2015-07-09 ENCOUNTER — Encounter (HOSPITAL_COMMUNITY): Payer: Self-pay | Admitting: Physical Therapy

## 2015-07-09 ENCOUNTER — Ambulatory Visit (HOSPITAL_COMMUNITY): Payer: Medicare Other | Admitting: Physical Therapy

## 2015-07-09 DIAGNOSIS — R262 Difficulty in walking, not elsewhere classified: Secondary | ICD-10-CM | POA: Diagnosis not present

## 2015-07-09 DIAGNOSIS — R29898 Other symptoms and signs involving the musculoskeletal system: Secondary | ICD-10-CM | POA: Diagnosis not present

## 2015-07-09 DIAGNOSIS — R269 Unspecified abnormalities of gait and mobility: Secondary | ICD-10-CM | POA: Diagnosis not present

## 2015-07-09 DIAGNOSIS — I890 Lymphedema, not elsewhere classified: Secondary | ICD-10-CM | POA: Diagnosis not present

## 2015-07-09 DIAGNOSIS — T148XXA Other injury of unspecified body region, initial encounter: Secondary | ICD-10-CM

## 2015-07-09 DIAGNOSIS — T148 Other injury of unspecified body region: Secondary | ICD-10-CM | POA: Diagnosis not present

## 2015-07-09 NOTE — Therapy (Signed)
[place], [name], [address] Phone: [phone]   Fax:  [phone]  Physical Therapy Treatment  Patient Details  Name: Stephanie Combs MRN: [id number] Date of Birth: 1950/10/27 No Data Recorded  Encounter Date: 07/09/2015      PT End of Session - 07/09/15 1254    Visit Number 16   Number of Visits 28   Date for PT Re-Evaluation 08/20/15   Authorization Type UHC medicare (gcode completed on visit #9)   Authorization - Visit Number 16   Authorization - Number of Visits 26   PT Start Time 1025   PT Stop Time 1206   PT Time Calculation (min) 101 min   Activity Tolerance Patient tolerated treatment well   Behavior During Therapy [facility] for tasks assessed/performed      Past Medical History  Diagnosis Date  . Hypertension   . Diabetes mellitus without complication ([place])   . COPD (chronic obstructive pulmonary disease) ([place])   . CHF (congestive heart failure) ([facility])     Diastolic  . Anxiety   . Chronic back pain   . Hypothyroidism   . Pedal edema   . On home O2     2.5 L N/C prn  . Degenerative disk disease     Past Surgical History  Procedure Laterality Date  . Tubal ligation    . Hernia repair      There were no vitals filed for this visit.  Visit Diagnosis:  Lymphedema - Plan: PT PLAN OF CARE CERT/RE-CERT  Nonhealing nonsurgical wound - Plan: PT PLAN OF CARE CERT/RE-CERT  Leg heaviness - Plan: PT PLAN OF CARE CERT/RE-CERT      Subjective Assessment - 07/09/15 1036    Subjective My legs are doing better. I am getting there I think.                        Wound Therapy - 07/09/15 1330    Subjective Pt states her legs are doing better. She is eager to get her garments.    Patient and Family Stated Goals wound to heal and to not have to wear profore    Date of Onset 08/11/14   Prior Treatments hospitalized, various antibiotics, self care.    Evaluation and Treatment  Procedures agreed to   Wound Properties Date First Assessed: 11/07/14 Location: Toe (Comment  which one) , right great toe  Location Orientation: Right Wound Description (Comments): small open hole under the right grest toe   Dressing Type Compression wrap;Gauze (Comment);Other (Comment)   Dressing Status Clean   Dressing Change Frequency PRN   Site / Wound Assessment Granulation tissue;Dry   % Wound base Red or Granulating 100%   % Wound base Yellow 0%   Peri-wound Assessment Other (Comment)  calloused   Margins Attached edges (approximated)   Drainage Amount Scant   Drainage Description Serous   Wound Therapy - Clinical Statement Took pt's bilateral LE circumferential measurements today. Had pt complete Lymphedema LIfe Impact Scale this day. Pt's wound sized has decreased since it was measured last time. Depth has decreased by 0.2 cm. Goals updated this day. Pt is awaiting arrival of compression garments and once she is able to demonstrate independence with donning/doffing compression garments she will be ready for discharge from skilled PT services.    Wound Therapy - Functional Problem List Life Impact score:  24% impairment   Factors Delaying/Impairing Wound Healing Altered sensation;Diabetes Mellitus;Infection -  systemic/local;Immobility;Multiple medical problems;Tobacco use   Hydrotherapy Plan Dressing change;Patient/family education;Other (comment)  manual decongestive techniques followed by compression dress   Wound Plan manual consisit of supraclavicular deep and superficial abdominal, routing fluid using the inguinal/axillary anastomosis as well as LE anteriorly. If wound is not improving change to silver dressing instead of medihoney.    Dressing  vaseline to callous, medihoney to wound followed by 2x2 and toe wrap.  Pt then dressed with layered compression system,(profore) bilaterally, netting to prevent coban from sticking to pants.    Manual Therapy Manual for bilateral LE's, short  neck followed by deep abdominal, then stimulation of axillary lymphnodes routing fluid using inguinal/axillary anastomosis anteriorly and LE .  Manual done on both the right and the left side.    Decrease Necrotic Tissue to STG:  2 wk 0%   Increase Granulation Tissue to STG: 2 weeks 100%    Decrease Length/Width/Depth by (cm) STG: 2 weeks decrease depth and undermining by .3cm; LTG: 6 weeks:  decease by .7x.4.x.5   Patient/Family will be able to  STG: 2 weeks understanding of lymphedema and risk of cellulitis;  4 weeK;; Signs and symptoms of cellulitis    Additional Wound Therapy Goal STG 3 weeks:  volume to be decreased by 20%; LTG 6 weeks volume to be decreased by 35%   Goals/treatment plan/discharge plan were made with and agreed upon by patient/family Yes   Time For Goal Achievement --  6 weeks   Wound Therapy - Potential for Goals Good                  PT Education - July 16, 2015 1253    Education provided Yes   Education Details Importance of maintaining compression until compression garments arrive   Person(s) Educated Patient   Methods Explanation;Demonstration   Comprehension Verbalized understanding             PT Long Term Goals - 2015/07/16 1308    PT LONG TERM GOAL #1   Title Pt will be independent in donning/doffing compression garments for long term management of lymphedema.   Baseline unable - pt does not have compression garment at this point   Time 6   Period Weeks   Status New               Plan - 2015/07/16 1257    PT Frequency 2x / week   PT Duration 6 weeks   PT Treatment/Interventions --  wound care, sharp debridement   Consulted and Agree with Plan of Care Patient     Took pt's bilateral LE circumferential measurements today. Had pt complete Lymphedema LIfe Impact Scale this day. Pt's wound sized has decreased since it was measured last time. Depth has decreased by 0.2 cm. Goals updated this day. Pt is awaiting arrival of compression  garments and once she is able to demonstrate independence with donning/doffing compression garments she will be ready for discharge from skilled PT services. RLE volume decreased by 136.21 cm^3 and LLE by 632.18 cm^3. Pt will be instructed in proper donning/doffing of compression garments for long term management of lymphedema.      G-Codes - 07-16-15 1301    Functional Assessment Tool Used Lymphedema Life Impact Scale   Functional Limitation Other PT primary   Other PT Primary Current Status (M0102) At least 20 percent but less than 40 percent impaired, limited or restricted   Other PT Primary Goal Status (V2536) At least 1 percent but less than 20 percent impaired,  limited or restricted      Problem List Patient Active Problem List   Diagnosis Date Noted  . Pressure ulcer 05/02/2015  . Hyponatremia 05/01/2015  . Acute-on-chronic kidney injury ([place]) 05/01/2015  . Diabetes mellitus with hyperglycemia, with long-term current use of insulin ([place]) 05/01/2015  . COPD exacerbation ([place]) 04/30/2015  . Cellulitis of foot, right 03/24/2015  . Elevated d-dimer 03/24/2015  . Peripheral edema 12/04/2014  . Bilateral edema of lower extremity   . Diabetic ulcer of right great toe ([place])   . Foot ulcer due to secondary DM ([place]) 11/07/2014  . Diabetic ulcer of right foot ([place]) 11/07/2014  . Edema of lower extremity 05/27/2013  . Headache 05/26/2013  . CKD (chronic kidney disease), stage III 04/14/2013  . Leukocytosis 04/14/2013  . Anemia in CKD (chronic kidney disease) 04/14/2013  . Dyspnea 04/14/2013  . Chronic diastolic CHF (congestive heart failure) ([place])   . COPD (chronic obstructive pulmonary disease) ([place]) 08/07/2012  . Morbid obesity ([place]) 08/07/2012  . DM type 2 (diabetes mellitus, type 2) ([place]) 08/07/2012  . HTN (hypertension), benign 08/07/2012  . Hypothyroidism (acquired) 08/07/2012    Alexia Freestone 07/09/2015, 1:30 PM  [facility] [place], [name], [address] Phone: [phone]   Fax:  [phone]  Name: Stephanie Combs MRN: [id number] Date of Birth: 06/22/1950    Allyson Sabal, PT 07/09/2015 1:31 PM

## 2015-07-11 ENCOUNTER — Ambulatory Visit (HOSPITAL_COMMUNITY): Payer: Medicare Other | Attending: Internal Medicine | Admitting: Physical Therapy

## 2015-07-11 DIAGNOSIS — T148 Other injury of unspecified body region: Secondary | ICD-10-CM | POA: Insufficient documentation

## 2015-07-11 DIAGNOSIS — R262 Difficulty in walking, not elsewhere classified: Secondary | ICD-10-CM | POA: Diagnosis present

## 2015-07-11 DIAGNOSIS — I890 Lymphedema, not elsewhere classified: Secondary | ICD-10-CM | POA: Insufficient documentation

## 2015-07-11 DIAGNOSIS — R29898 Other symptoms and signs involving the musculoskeletal system: Secondary | ICD-10-CM

## 2015-07-11 DIAGNOSIS — R269 Unspecified abnormalities of gait and mobility: Secondary | ICD-10-CM | POA: Diagnosis present

## 2015-07-11 DIAGNOSIS — T148XXA Other injury of unspecified body region, initial encounter: Secondary | ICD-10-CM

## 2015-07-11 NOTE — Therapy (Signed)
[place], [name], [address] Phone: [phone]   Fax:  [phone]  Wound Care Therapy  Patient Details  Name: Stephanie Combs MRN: [id number] Date of Birth: 04/27/1951 No Data Recorded  Encounter Date: 07/11/2015      PT End of Session - 07/11/15 1618    Visit Number 17   Number of Visits 28   Date for PT Re-Evaluation 08/20/15   Authorization Type UHC medicare (gcode completed on visit #16)   Authorization - Visit Number 17   Authorization - Number of Visits 26   PT Start Time 1315   PT Stop Time 1440   PT Time Calculation (min) 85 min   Activity Tolerance Patient tolerated treatment well   Behavior During Therapy [facility] for tasks assessed/performed      Past Medical History  Diagnosis Date  . Hypertension   . Diabetes mellitus without complication ([place])   . COPD (chronic obstructive pulmonary disease) ([place])   . CHF (congestive heart failure) ([facility])     Diastolic  . Anxiety   . Chronic back pain   . Hypothyroidism   . Pedal edema   . On home O2     2.5 L N/C prn  . Degenerative disk disease     Past Surgical History  Procedure Laterality Date  . Tubal ligation    . Hernia repair      There were no vitals filed for this visit.  Visit Diagnosis:  Lymphedema  Nonhealing nonsurgical wound  Leg heaviness  Difficulty walking  Abnormal gait                 Wound Therapy - 07/11/15 1453    Subjective PT states she is doing well overall.  Today is week 2 since ordering stockings so she's hoping to have them next week.    Patient and Family Stated Goals wound to heal and to not have to wear profore    Date of Onset 08/11/14   Prior Treatments hospitalized, various antibiotics, self care.    Pain Assessment No/denies pain   Evaluation and Treatment Procedures agreed to   Wound Properties Date First Assessed: 11/07/14 Location: Toe (Comment  which one) , right great toe  Location  Orientation: Right Wound Description (Comments): small open hole under the right grest toe   Dressing Type Compression wrap;Gauze (Comment);Other (Comment)  profore   Dressing Changed Changed   Dressing Status Clean   Dressing Change Frequency PRN   Site / Wound Assessment Granulation tissue;Dry   % Wound base Red or Granulating 100%   % Wound base Yellow 0%   Peri-wound Assessment Other (Comment)  calloused   Margins Attached edges (approximated)   Drainage Amount Scant   Drainage Description Serous   Treatment Cleansed;Debridement (Selective)   Selective Debridement - Location callous on plantar surface of Rt great toe   Selective Debridement - Tools Used Scalpel   Selective Debridement - Tissue Removed callous    Wound Therapy - Clinical Statement Spent large amount of time shaving callous from perimeter of wound on plantar surface of great toe.  Wound is overall decreasing in depth, but not yet approximated.  Continued with medihoney dressings and toe bandages.  Manual lymph drainage completed followed by moisturizing LE's, bandaging using profore system for compression.    Wound Therapy - Functional Problem List Life Impact score:  24% impairment   Factors Delaying/Impairing Wound Healing Altered sensation;Diabetes Mellitus;Infection - systemic/local;Immobility;Multiple medical problems;Tobacco  use   Hydrotherapy Plan Dressing change;Patient/family education;Other (comment)  manual decongestive techniques followed by compression dress   Wound Plan continue wound care and manual lymph drainage/ wrapping with profore until compression stockings received.    Dressing  vaseline to callous, medihoney to wound followed by 2x2 and toe wrap.  Pt then dressed with layered compression system,(profore) bilaterally, netting to prevent coban from sticking to pants.    Manual Therapy Manual for bilateral LE's, short neck followed by deep abdominal, then stimulation of axillary lymphnodes routing  fluid using inguinal/axillary anastomosis anteriorly and LE .  Manual done on both the right and the left side.    Decrease Necrotic Tissue to STG:  2 wk 0%   Increase Granulation Tissue to STG: 2 weeks 100%    Decrease Length/Width/Depth by (cm) STG: 2 weeks decrease depth and undermining by .3cm; LTG: 6 weeks:  decease by .7x.4.x.5   Patient/Family will be able to  STG: 2 weeks understanding of lymphedema and risk of cellulitis;  4 weeK;; Signs and symptoms of cellulitis    Additional Wound Therapy Goal STG 3 weeks:  volume to be decreased by 20%; LTG 6 weeks volume to be decreased by 35%   Goals/treatment plan/discharge plan were made with and agreed upon by patient/family Yes   Time For Goal Achievement --  6 weeks   Wound Therapy - Potential for Goals Good                      PT Long Term Goals - 07/09/15 1308    PT LONG TERM GOAL #1   Title Pt will be independent in donning/doffing compression garments for long term management of lymphedema.   Baseline unable - pt does not have compression garment at this point   Time 6   Period Weeks   Status New                Problem List Patient Active Problem List   Diagnosis Date Noted  . Pressure ulcer 05/02/2015  . Hyponatremia 05/01/2015  . Acute-on-chronic kidney injury ([place]) 05/01/2015  . Diabetes mellitus with hyperglycemia, with long-term current use of insulin ([place]) 05/01/2015  . COPD exacerbation ([place]) 04/30/2015  . Cellulitis of foot, right 03/24/2015  . Elevated d-dimer 03/24/2015  . Peripheral edema 12/04/2014  . Bilateral edema of lower extremity   . Diabetic ulcer of right great toe ([place])   . Foot ulcer due to secondary DM ([place]) 11/07/2014  . Diabetic ulcer of right foot ([place]) 11/07/2014  . Edema of lower extremity 05/27/2013  . Headache 05/26/2013  . CKD (chronic kidney disease), stage III 04/14/2013  . Leukocytosis 04/14/2013  . Anemia in CKD (chronic kidney disease) 04/14/2013  . Dyspnea  04/14/2013  . Chronic diastolic CHF (congestive heart failure) ([place])   . COPD (chronic obstructive pulmonary disease) ([place]) 08/07/2012  . Morbid obesity ([place]) 08/07/2012  . DM type 2 (diabetes mellitus, type 2) ([place]) 08/07/2012  . HTN (hypertension), benign 08/07/2012  . Hypothyroidism (acquired) 08/07/2012    Teena Irani, PTA/CLT [phone]  07/11/2015, 4:19 PM  [facility] [place], [name], [address] Phone: [phone]   Fax:  [phone]  Name: KENDALLYN LIPPOLD MRN: [id number] Date of Birth: Jan 25, 1951

## 2015-07-13 ENCOUNTER — Ambulatory Visit (HOSPITAL_COMMUNITY): Payer: Medicare Other | Admitting: Physical Therapy

## 2015-07-16 ENCOUNTER — Encounter (HOSPITAL_COMMUNITY): Payer: Medicare Other | Admitting: Physical Therapy

## 2015-07-18 ENCOUNTER — Telehealth (HOSPITAL_COMMUNITY): Payer: Self-pay | Admitting: Physical Therapy

## 2015-07-18 ENCOUNTER — Ambulatory Visit (HOSPITAL_COMMUNITY): Payer: Medicare Other | Admitting: Physical Therapy

## 2015-07-18 ENCOUNTER — Encounter (HOSPITAL_COMMUNITY): Payer: Medicare Other | Admitting: Physical Therapy

## 2015-07-18 DIAGNOSIS — I890 Lymphedema, not elsewhere classified: Secondary | ICD-10-CM | POA: Diagnosis not present

## 2015-07-18 DIAGNOSIS — T148XXA Other injury of unspecified body region, initial encounter: Secondary | ICD-10-CM

## 2015-07-18 DIAGNOSIS — R269 Unspecified abnormalities of gait and mobility: Secondary | ICD-10-CM | POA: Diagnosis not present

## 2015-07-18 DIAGNOSIS — R262 Difficulty in walking, not elsewhere classified: Secondary | ICD-10-CM | POA: Diagnosis not present

## 2015-07-18 DIAGNOSIS — T148 Other injury of unspecified body region: Secondary | ICD-10-CM | POA: Diagnosis not present

## 2015-07-18 DIAGNOSIS — R29898 Other symptoms and signs involving the musculoskeletal system: Secondary | ICD-10-CM

## 2015-07-18 NOTE — Telephone Encounter (Signed)
Contacted Brandy at [facility] regarding compression garments as today is her 3 week point since getting measured.  Brandy informed that since medicaid was the covering provider they would be delivered by this source.  Contacted Hope at [phone] and discussed with her.  They are out of town and scheduled to come this way the week of March 21st.  Pt states she has spoken to Ms. Ore about this and is aware that the patient is unhappy with her wait to receive her garments.  Hope stated her reidsleeves and garments are ready and she is trying to work out an earlier delivery date on 3/16 and would let the patient and our office know.  Teena Irani, PTA/CLT [phone]

## 2015-07-18 NOTE — Therapy (Signed)
[place], [name], [address] Phone: [phone]   Fax:  [phone]  Wound Care Therapy  Patient Details  Name: Stephanie Combs MRN: [id number] Date of Birth: [birth year] No Data Recorded  Encounter Date: 07/18/2015      PT End of Session - 07/18/15 1242    Visit Number 18   Number of Visits 28   Date for PT Re-Evaluation 08/20/15   Authorization Type [employer] medicare (gcode completed on visit #16)   Authorization - Visit Number 18   Authorization - Number of Visits 26   PT Start Time 1030   PT Stop Time 1145   PT Time Calculation (min) 75 min   Activity Tolerance Patient tolerated treatment well   Behavior During Therapy [facility] for tasks assessed/performed      Past Medical History  Diagnosis Date  . Hypertension   . Diabetes mellitus without complication ([place])   . COPD (chronic obstructive pulmonary disease) ([place])   . CHF (congestive heart failure) ([facility])     Diastolic  . Anxiety   . Chronic back pain   . Hypothyroidism   . Pedal edema   . On home O2     2.5 L N/C prn  . Degenerative disk disease     Past Surgical History  Procedure Laterality Date  . Tubal ligation    . Hernia repair      There were no vitals filed for this visit.  Visit Diagnosis:  Lymphedema  Nonhealing nonsurgical wound  Leg heaviness  Difficulty walking  Abnormal gait                 Wound Therapy - 07/18/15 1220    Subjective Pt is upset with not having her garments as she thought this week.  Pt has spoken to [place] and the medicaid people about her garments.  suppose to get them in the next 2 weeks.   Patient and Family Stated Goals wound to heal and to not have to wear profore    Date of Onset 08/11/14   Prior Treatments hospitalized, various antibiotics, self care.    Pain Assessment No/denies pain   Evaluation and Treatment Procedures agreed to   Wound Properties Date First Assessed: 11/07/14  Location: Toe (Comment  which one) , right great toe  Location Orientation: Right Wound Description (Comments): small open hole under the right grest toe   Dressing Type Compression wrap;Gauze (Comment);Other (Comment)  profore   Dressing Changed Changed   Dressing Status Clean   Dressing Change Frequency PRN   Site / Wound Assessment Granulation tissue;Dry   % Wound base Red or Granulating 100%   % Wound base Yellow 0%   Peri-wound Assessment Other (Comment)  calloused   Margins Attached edges (approximated)   Drainage Amount Scant   Drainage Description Serous   Treatment Cleansed;Debridement (Selective)   Selective Debridement - Location callous on plantar surface of Rt great toe   Selective Debridement - Tools Used Scalpel   Selective Debridement - Tissue Removed callous    Wound Therapy - Clinical Statement continued shaving of callous with continued improvement.  LE's continue to stay maintained with reduced fluid.  Cleansed LE's, moisturized and continued with profore dressings to B LE's.  Pt with overall comfort expressed.   Wound Therapy - Functional Problem List Life Impact score:  24% impairment   Factors Delaying/Impairing Wound Healing Altered sensation;Diabetes Mellitus;Infection - systemic/local;Immobility;Multiple medical problems;Tobacco use   Hydrotherapy Plan Dressing  change;Patient/family education;Other (comment)  manual decongestive techniques followed by compression dress   Wound Plan manual consisit of supraclavicular deep and superficial abdominal, routing fluid using the inguinal/axillary anastomosis as well as LE anteriorly. If wound is not improving change to silver dressing instead of medihoney.    Dressing  vaseline to callous, medihoney to wound followed by 2x2 and toe wrap.  Pt then dressed with layered compression system,(profore) bilaterally, netting to prevent coban from sticking to pants.    Manual Therapy Manual for bilateral LE's, short neck followed by  deep abdominal, then stimulation of axillary lymphnodes routing fluid using inguinal/axillary anastomosis anteriorly and LE .  Manual done on both the right and the left side.    Decrease Necrotic Tissue to STG:  2 wk 0%   Increase Granulation Tissue to STG: 2 weeks 100%    Decrease Length/Width/Depth by (cm) STG: 2 weeks decrease depth and undermining by .3cm; LTG: 6 weeks:  decease by .7x.4.x.5   Patient/Family will be able to  STG: 2 weeks understanding of lymphedema and risk of cellulitis;  4 weeK;; Signs and symptoms of cellulitis    Additional Wound Therapy Goal STG 3 weeks:  volume to be decreased by 20%; LTG 6 weeks volume to be decreased by 35%   Goals/treatment plan/discharge plan were made with and agreed upon by patient/family Yes   Time For Goal Achievement --  6 weeks   Wound Therapy - Potential for Goals Good                      PT Long Term Goals - 07/09/15 1308    PT LONG TERM GOAL #1   Title Pt will be independent in donning/doffing compression garments for long term management of lymphedema.   Baseline unable - pt does not have compression garment at this point   Time 6   Period Weeks   Status New                Problem List Patient Active Problem List   Diagnosis Date Noted  . Pressure ulcer 05/02/2015  . Hyponatremia 05/01/2015  . Acute-on-chronic kidney injury ([place]) 05/01/2015  . Diabetes mellitus with hyperglycemia, with long-term current use of insulin ([place]) 05/01/2015  . COPD exacerbation ([place]) 04/30/2015  . Cellulitis of foot, right 03/24/2015  . Elevated d-dimer 03/24/2015  . Peripheral edema 12/04/2014  . Bilateral edema of lower extremity   . Diabetic ulcer of right great toe ([place])   . Foot ulcer due to secondary DM ([place]) 11/07/2014  . Diabetic ulcer of right foot ([place]) 11/07/2014  . Edema of lower extremity 05/27/2013  . Headache 05/26/2013  . CKD (chronic kidney disease), stage III 04/14/2013  . Leukocytosis 04/14/2013   . Anemia in CKD (chronic kidney disease) 04/14/2013  . Dyspnea 04/14/2013  . Chronic diastolic CHF (congestive heart failure) ([place])   . COPD (chronic obstructive pulmonary disease) ([place]) 08/07/2012  . Morbid obesity ([place]) 08/07/2012  . DM type 2 (diabetes mellitus, type 2) ([place]) 08/07/2012  . HTN (hypertension), benign 08/07/2012  . Hypothyroidism (acquired) 08/07/2012    Teena Irani, PTA/CLT [phone]  07/18/2015, 12:43 PM  [facility] [address] [place], [name], [address] Phone: [phone]   Fax:  [phone]  Name: TABYTHA GRADILLAS MRN: [id number] Date of Birth: 05-03-1951

## 2015-07-19 ENCOUNTER — Encounter (HOSPITAL_COMMUNITY): Payer: Medicare Other | Admitting: Physical Therapy

## 2015-07-23 ENCOUNTER — Ambulatory Visit (HOSPITAL_COMMUNITY): Payer: Medicare Other | Admitting: Physical Therapy

## 2015-07-23 DIAGNOSIS — R269 Unspecified abnormalities of gait and mobility: Secondary | ICD-10-CM

## 2015-07-23 DIAGNOSIS — T148 Other injury of unspecified body region: Secondary | ICD-10-CM | POA: Diagnosis not present

## 2015-07-23 DIAGNOSIS — R262 Difficulty in walking, not elsewhere classified: Secondary | ICD-10-CM

## 2015-07-23 DIAGNOSIS — T148XXA Other injury of unspecified body region, initial encounter: Secondary | ICD-10-CM

## 2015-07-23 DIAGNOSIS — I890 Lymphedema, not elsewhere classified: Secondary | ICD-10-CM | POA: Diagnosis not present

## 2015-07-23 DIAGNOSIS — R29898 Other symptoms and signs involving the musculoskeletal system: Secondary | ICD-10-CM

## 2015-07-23 NOTE — Therapy (Signed)
[place], [name], [address] Phone: [phone]   Fax:  [phone]  Wound Care Therapy  Patient Details  Name: Stephanie Combs MRN: [id number] Date of Birth: Aug 19, 1950 No Data Recorded  Encounter Date: 07/23/2015      PT End of Session - 07/23/15 1425    Visit Number 19   Number of Visits 28   Date for PT Re-Evaluation 08/20/15   Authorization Type [employer] medicare (gcode completed on visit #16)   Authorization - Visit Number 19   Authorization - Number of Visits 26   PT Start Time 6415   PT Stop Time 1200   PT Time Calculation (min) 85 min   Activity Tolerance Patient tolerated treatment well   Behavior During Therapy [facility] for tasks assessed/performed      Past Medical History  Diagnosis Date  . Hypertension   . Diabetes mellitus without complication ([place])   . COPD (chronic obstructive pulmonary disease) ([place])   . CHF (congestive heart failure) ([facility])     Diastolic  . Anxiety   . Chronic back pain   . Hypothyroidism   . Pedal edema   . On home O2     2.5 L N/C prn  . Degenerative disk disease     Past Surgical History  Procedure Laterality Date  . Tubal ligation    . Hernia repair      There were no vitals filed for this visit.  Visit Diagnosis:  Lymphedema  Nonhealing nonsurgical wound  Leg heaviness  Difficulty walking  Abnormal gait      Subjective Assessment - 07/23/15 1416    Subjective PT came at 10:30 stating her appt was at 10:15 (it was at 2:30).  Able to arrange the schedule to get visit completed.  PT reported some Lt ankle discomfort unsure of reason.   Currently in Pain? No/denies                   Wound Therapy - 07/23/15 1420    Subjective Pt hoping to receive her garment this week but should definately next week.  No pain currently.   Patient and Family Stated Goals wound to heal and to not have to wear profore    Date of Onset 08/11/14   Prior  Treatments hospitalized, various antibiotics, self care.    Evaluation and Treatment Procedures agreed to   Wound Properties Date First Assessed: 11/07/14 Location: Toe (Comment  which one) , right great toe  Location Orientation: Right Wound Description (Comments): small open hole under the right grest toe   Dressing Type Compression wrap;Gauze (Comment);Other (Comment)  profore   Dressing Changed Changed   Dressing Status Clean   Dressing Change Frequency PRN   Site / Wound Assessment Granulation tissue;Dry   % Wound base Red or Granulating 100%   % Wound base Yellow 0%   Peri-wound Assessment Other (Comment)  calloused   Margins Attached edges (approximated)   Drainage Amount Scant   Drainage Description Serosanguineous   Treatment Cleansed;Debridement (Selective)   Selective Debridement - Location callous on plantar surface of Rt great toe   Selective Debridement - Tools Used Scalpel   Selective Debridement - Tissue Removed callous    Wound Therapy - Clinical Statement Toe wound still not with vast improvement from last weel  continued to shave callous with continued improvement.  LE's continue to stay maintained with reduced fluid.  Cleansed LE's, moisturized and continued with profore dressings  to B LE's.  Pt with overall comfort expressed.   Wound Therapy - Functional Problem List Life Impact score:  24% impairment   Factors Delaying/Impairing Wound Healing Altered sensation;Diabetes Mellitus;Infection - systemic/local;Immobility;Multiple medical problems;Tobacco use   Hydrotherapy Plan Dressing change;Patient/family education;Other (comment)  manual decongestive techniques followed by compression dress   Wound Plan manual consisit of supraclavicular deep and superficial abdominal, routing fluid using the inguinal/axillary anastomosis as well as LE anteriorly. If wound is not improving change to silver dressing instead of medihoney.    Dressing  vaseline to callous, medihoney to  wound followed by 2x2 and toe wrap.  Pt then dressed with layered compression system,(profore) bilaterally, netting to prevent coban from sticking to pants.    Manual Therapy Manual for bilateral LE's, short neck followed by deep abdominal, then stimulation of axillary lymphnodes routing fluid using inguinal/axillary anastomosis anteriorly and LE .  Manual done on both the right and the left side.    Decrease Necrotic Tissue to STG:  2 wk 0%   Decrease Necrotic Tissue - Progress Progressing toward goal   Increase Granulation Tissue to STG: 2 weeks 100%    Increase Granulation Tissue - Progress Progressing toward goal   Decrease Length/Width/Depth by (cm) STG: 2 weeks decrease depth and undermining by .3cm; LTG: 6 weeks:  decease by .7x.4.x.5   Decrease Length/Width/Depth - Progress Progressing toward goal   Patient/Family will be able to  STG: 2 weeks understanding of lymphedema and risk of cellulitis;  4 weeK;; Signs and symptoms of cellulitis    Patient/Family Instruction Goal - Progress Met   Additional Wound Therapy Goal STG 3 weeks:  volume to be decreased by 20%; LTG 6 weeks volume to be decreased by 35%   Additional Wound Therapy Goal - Progress Met   Goals/treatment plan/discharge plan were made with and agreed upon by patient/family Yes   Time For Goal Achievement --  6 weeks   Wound Therapy - Potential for Goals Good                      PT Long Term Goals - 07/09/15 1308    PT LONG TERM GOAL #1   Title Pt will be independent in donning/doffing compression garments for long term management of lymphedema.   Baseline unable - pt does not have compression garment at this point   Time 6   Period Weeks   Status New                Problem List Patient Active Problem List   Diagnosis Date Noted  . Pressure ulcer 05/02/2015  . Hyponatremia 05/01/2015  . Acute-on-chronic kidney injury ([place]) 05/01/2015  . Diabetes mellitus with hyperglycemia, with long-term  current use of insulin ([place]) 05/01/2015  . COPD exacerbation ([place]) 04/30/2015  . Cellulitis of foot, right 03/24/2015  . Elevated d-dimer 03/24/2015  . Peripheral edema 12/04/2014  . Bilateral edema of lower extremity   . Diabetic ulcer of right great toe ([place])   . Foot ulcer due to secondary DM ([place]) 11/07/2014  . Diabetic ulcer of right foot ([place]) 11/07/2014  . Edema of lower extremity 05/27/2013  . Headache 05/26/2013  . CKD (chronic kidney disease), stage III 04/14/2013  . Leukocytosis 04/14/2013  . Anemia in CKD (chronic kidney disease) 04/14/2013  . Dyspnea 04/14/2013  . Chronic diastolic CHF (congestive heart failure) ([place])   . COPD (chronic obstructive pulmonary disease) ([place]) 08/07/2012  . Morbid obesity ([place]) 08/07/2012  . DM  type 2 (diabetes mellitus, type 2) ([place]) 08/07/2012  . HTN (hypertension), benign 08/07/2012  . Hypothyroidism (acquired) 08/07/2012    Teena Irani, PTA/CLT [phone]  07/23/2015, 2:28 PM  [place], [name], [address] Phone: [phone]   Fax:  [phone]  Name: AHSHA HINSLEY MRN: [id number] Date of Birth: July 09, 1950

## 2015-07-25 ENCOUNTER — Encounter (HOSPITAL_COMMUNITY): Payer: Self-pay | Admitting: Emergency Medicine

## 2015-07-25 ENCOUNTER — Emergency Department (HOSPITAL_COMMUNITY): Payer: Medicare Other

## 2015-07-25 ENCOUNTER — Inpatient Hospital Stay (HOSPITAL_COMMUNITY)
Admission: EM | Admit: 2015-07-25 | Discharge: 2015-07-27 | DRG: 152 | Disposition: A | Discharge status: Home Health | Payer: Medicare Other | Attending: Internal Medicine | Admitting: Internal Medicine

## 2015-07-25 ENCOUNTER — Encounter (HOSPITAL_COMMUNITY): Payer: Medicare Other | Admitting: Physical Therapy

## 2015-07-25 DIAGNOSIS — N183 Chronic kidney disease, stage 3 unspecified: Secondary | ICD-10-CM | POA: Diagnosis present

## 2015-07-25 DIAGNOSIS — E1122 Type 2 diabetes mellitus with diabetic chronic kidney disease: Secondary | ICD-10-CM

## 2015-07-25 DIAGNOSIS — I5032 Chronic diastolic (congestive) heart failure: Secondary | ICD-10-CM | POA: Diagnosis present

## 2015-07-25 DIAGNOSIS — Z7982 Long term (current) use of aspirin: Secondary | ICD-10-CM

## 2015-07-25 DIAGNOSIS — Z833 Family history of diabetes mellitus: Secondary | ICD-10-CM | POA: Diagnosis not present

## 2015-07-25 DIAGNOSIS — Z7952 Long term (current) use of systemic steroids: Secondary | ICD-10-CM

## 2015-07-25 DIAGNOSIS — Z794 Long term (current) use of insulin: Secondary | ICD-10-CM

## 2015-07-25 DIAGNOSIS — R531 Weakness: Secondary | ICD-10-CM

## 2015-07-25 DIAGNOSIS — M791 Myalgia: Secondary | ICD-10-CM | POA: Diagnosis present

## 2015-07-25 DIAGNOSIS — I10 Essential (primary) hypertension: Secondary | ICD-10-CM | POA: Diagnosis present

## 2015-07-25 DIAGNOSIS — I130 Hypertensive heart and chronic kidney disease with heart failure and stage 1 through stage 4 chronic kidney disease, or unspecified chronic kidney disease: Secondary | ICD-10-CM | POA: Diagnosis present

## 2015-07-25 DIAGNOSIS — Z7951 Long term (current) use of inhaled steroids: Secondary | ICD-10-CM

## 2015-07-25 DIAGNOSIS — J441 Chronic obstructive pulmonary disease with (acute) exacerbation: Secondary | ICD-10-CM | POA: Diagnosis present

## 2015-07-25 DIAGNOSIS — J440 Chronic obstructive pulmonary disease with acute lower respiratory infection: Secondary | ICD-10-CM | POA: Diagnosis present

## 2015-07-25 DIAGNOSIS — E039 Hypothyroidism, unspecified: Secondary | ICD-10-CM | POA: Diagnosis present

## 2015-07-25 DIAGNOSIS — R509 Fever, unspecified: Secondary | ICD-10-CM | POA: Diagnosis not present

## 2015-07-25 DIAGNOSIS — Z823 Family history of stroke: Secondary | ICD-10-CM

## 2015-07-25 DIAGNOSIS — Z66 Do not resuscitate: Secondary | ICD-10-CM | POA: Diagnosis present

## 2015-07-25 DIAGNOSIS — F17210 Nicotine dependence, cigarettes, uncomplicated: Secondary | ICD-10-CM | POA: Diagnosis present

## 2015-07-25 DIAGNOSIS — K5900 Constipation, unspecified: Secondary | ICD-10-CM | POA: Diagnosis present

## 2015-07-25 DIAGNOSIS — Z79891 Long term (current) use of opiate analgesic: Secondary | ICD-10-CM | POA: Diagnosis not present

## 2015-07-25 DIAGNOSIS — E1165 Type 2 diabetes mellitus with hyperglycemia: Secondary | ICD-10-CM | POA: Diagnosis present

## 2015-07-25 DIAGNOSIS — R031 Nonspecific low blood-pressure reading: Secondary | ICD-10-CM | POA: Diagnosis not present

## 2015-07-25 DIAGNOSIS — J449 Chronic obstructive pulmonary disease, unspecified: Secondary | ICD-10-CM | POA: Diagnosis present

## 2015-07-25 DIAGNOSIS — J111 Influenza due to unidentified influenza virus with other respiratory manifestations: Secondary | ICD-10-CM | POA: Diagnosis present

## 2015-07-25 DIAGNOSIS — R05 Cough: Secondary | ICD-10-CM | POA: Diagnosis not present

## 2015-07-25 DIAGNOSIS — IMO0002 Reserved for concepts with insufficient information to code with codable children: Secondary | ICD-10-CM

## 2015-07-25 DIAGNOSIS — Z9981 Dependence on supplemental oxygen: Secondary | ICD-10-CM | POA: Diagnosis not present

## 2015-07-25 DIAGNOSIS — Z8249 Family history of ischemic heart disease and other diseases of the circulatory system: Secondary | ICD-10-CM

## 2015-07-25 DIAGNOSIS — Z6841 Body Mass Index (BMI) 40.0 and over, adult: Secondary | ICD-10-CM | POA: Diagnosis not present

## 2015-07-25 DIAGNOSIS — J189 Pneumonia, unspecified organism: Secondary | ICD-10-CM

## 2015-07-25 DIAGNOSIS — J9691 Respiratory failure, unspecified with hypoxia: Secondary | ICD-10-CM | POA: Diagnosis present

## 2015-07-25 DIAGNOSIS — R600 Localized edema: Secondary | ICD-10-CM | POA: Diagnosis present

## 2015-07-25 DIAGNOSIS — J438 Other emphysema: Secondary | ICD-10-CM | POA: Diagnosis not present

## 2015-07-25 DIAGNOSIS — R609 Edema, unspecified: Secondary | ICD-10-CM | POA: Diagnosis present

## 2015-07-25 LAB — COMPREHENSIVE METABOLIC PANEL
ALT: 10 U/L — ABNORMAL LOW (ref 14–54)
AST: 17 U/L (ref 15–41)
Albumin: 3.6 g/dL (ref 3.5–5.0)
Alkaline Phosphatase: 90 U/L (ref 38–126)
Anion gap: 7 (ref 5–15)
BUN: 26 mg/dL — ABNORMAL HIGH (ref 6–20)
CO2: 29 mmol/L (ref 22–32)
Calcium: 8.4 mg/dL — ABNORMAL LOW (ref 8.9–10.3)
Chloride: 103 mmol/L (ref 101–111)
Creatinine, Ser: 1.57 mg/dL — ABNORMAL HIGH (ref 0.44–1.00)
GFR calc Af Amer: 39 mL/min — ABNORMAL LOW (ref >60)
GFR calc non Af Amer: 34 mL/min — ABNORMAL LOW (ref >60)
Glucose, Bld: 134 mg/dL — ABNORMAL HIGH (ref 65–99)
Potassium: 4.5 mmol/L (ref 3.5–5.1)
Sodium: 139 mmol/L (ref 135–145)
Total Bilirubin: 0.5 mg/dL (ref 0.3–1.2)
Total Protein: 6.9 g/dL (ref 6.5–8.1)

## 2015-07-25 LAB — GLUCOSE, CAPILLARY
Glucose-Capillary: 206 mg/dL — ABNORMAL HIGH (ref 65–99)
Glucose-Capillary: 199 mg/dL — ABNORMAL HIGH (ref 65–99)
Glucose-Capillary: 306 mg/dL — ABNORMAL HIGH (ref 65–99)

## 2015-07-25 LAB — INFLUENZA PANEL BY PCR (TYPE A & B)
H1N1 flu by pcr: NOT DETECTED
Influenza A By PCR: POSITIVE — AB
Influenza B By PCR: NEGATIVE

## 2015-07-25 LAB — CBC WITH DIFFERENTIAL/PLATELET
Basophils Absolute: 0.1 10*3/uL (ref 0.0–0.1)
Basophils Relative: 1 %
Eosinophils Absolute: 0 10*3/uL (ref 0.0–0.7)
Eosinophils Relative: 1 %
HCT: 38.7 % (ref 36.0–46.0)
Hemoglobin: 11.6 g/dL — ABNORMAL LOW (ref 12.0–15.0)
Lymphocytes Relative: 12 %
Lymphs Abs: 0.9 10*3/uL (ref 0.7–4.0)
MCH: 25.7 pg — ABNORMAL LOW (ref 26.0–34.0)
MCHC: 30 g/dL (ref 30.0–36.0)
MCV: 85.8 fL (ref 78.0–100.0)
Monocytes Absolute: 0.6 10*3/uL (ref 0.1–1.0)
Monocytes Relative: 8 %
Neutro Abs: 6 10*3/uL (ref 1.7–7.7)
Neutrophils Relative %: 78 %
Platelets: 260 10*3/uL (ref 150–400)
RBC: 4.51 MIL/uL (ref 3.87–5.11)
RDW: 17 % — ABNORMAL HIGH (ref 11.5–15.5)
WBC: 7.6 10*3/uL (ref 4.0–10.5)

## 2015-07-25 LAB — URINALYSIS, ROUTINE W REFLEX MICROSCOPIC
Bilirubin Urine: NEGATIVE
Glucose, UA: >1000 mg/dL — AB
Ketones, ur: NEGATIVE mg/dL
Leukocytes, UA: NEGATIVE
Nitrite: NEGATIVE
Protein, ur: 30 mg/dL — AB
Specific Gravity, Urine: 1.02 (ref 1.005–1.030)
pH: 5 (ref 5.0–8.0)

## 2015-07-25 LAB — URINE MICROSCOPIC-ADD ON: WBC, UA: NONE SEEN WBC/hpf (ref 0–5)

## 2015-07-25 LAB — LACTIC ACID, PLASMA
Lactic Acid, Venous: 1.2 mmol/L (ref 0.5–2.0)
Lactic Acid, Venous: 1.1 mmol/L (ref 0.5–2.0)

## 2015-07-25 LAB — CBG MONITORING, ED: Glucose-Capillary: 123 mg/dL — ABNORMAL HIGH (ref 65–99)

## 2015-07-25 LAB — TSH: TSH: 0.117 u[IU]/mL — ABNORMAL LOW (ref 0.350–4.500)

## 2015-07-25 MED ORDER — ONDANSETRON HCL 4 MG PO TABS
4.0000 mg | ORAL_TABLET | Freq: Four times a day (QID) | ORAL | Status: DC | PRN
  Administered 2015-07-26: 4 mg via ORAL
  Filled 2015-07-25: qty 1

## 2015-07-25 MED ORDER — PANTOPRAZOLE SODIUM 40 MG PO TBEC
40.0000 mg | DELAYED_RELEASE_TABLET | Freq: Two times a day (BID) | ORAL | Status: DC
  Administered 2015-07-25 – 2015-07-27 (×5): 40 mg via ORAL
  Filled 2015-07-25 (×5): qty 1

## 2015-07-25 MED ORDER — ALBUTEROL SULFATE (2.5 MG/3ML) 0.083% IN NEBU: 2.5000 mg | INHALATION_SOLUTION | RESPIRATORY_TRACT | Status: DC | PRN

## 2015-07-25 MED ORDER — SACCHAROMYCES BOULARDII 250 MG PO CAPS
250.0000 mg | ORAL_CAPSULE | Freq: Two times a day (BID) | ORAL | Status: DC
  Administered 2015-07-25 – 2015-07-27 (×5): 250 mg via ORAL
  Filled 2015-07-25 (×5): qty 1

## 2015-07-25 MED ORDER — INSULIN ASPART 100 UNIT/ML ~~LOC~~ SOLN
0.0000 [IU] | Freq: Every day | SUBCUTANEOUS | Status: DC
  Administered 2015-07-25: 4 [IU] via SUBCUTANEOUS

## 2015-07-25 MED ORDER — HEPARIN SODIUM (PORCINE) 5000 UNIT/ML IJ SOLN
5000.0000 [IU] | Freq: Three times a day (TID) | INTRAMUSCULAR | Status: DC
  Administered 2015-07-25 – 2015-07-26 (×5): 5000 [IU] via SUBCUTANEOUS
  Filled 2015-07-25 (×5): qty 1

## 2015-07-25 MED ORDER — SODIUM CHLORIDE 0.9 % IV BOLUS (SEPSIS)
1000.0000 mL | Freq: Once | INTRAVENOUS | Status: AC
  Administered 2015-07-25: 1000 mL via INTRAVENOUS

## 2015-07-25 MED ORDER — IPRATROPIUM BROMIDE 0.02 % IN SOLN
0.5000 mg | Freq: Once | RESPIRATORY_TRACT | Status: AC
  Administered 2015-07-25: 0.5 mg via RESPIRATORY_TRACT

## 2015-07-25 MED ORDER — INSULIN DETEMIR 100 UNIT/ML ~~LOC~~ SOLN
20.0000 [IU] | Freq: Every day | SUBCUTANEOUS | Status: DC
  Administered 2015-07-25 – 2015-07-26 (×2): 20 [IU] via SUBCUTANEOUS
  Filled 2015-07-25 (×5): qty 0.2

## 2015-07-25 MED ORDER — OXYCODONE-ACETAMINOPHEN 5-325 MG PO TABS
2.0000 | ORAL_TABLET | Freq: Four times a day (QID) | ORAL | Status: DC | PRN
  Administered 2015-07-25 – 2015-07-27 (×4): 2 via ORAL
  Filled 2015-07-25 (×5): qty 2

## 2015-07-25 MED ORDER — LEVOFLOXACIN IN D5W 750 MG/150ML IV SOLN
750.0000 mg | Freq: Once | INTRAVENOUS | Status: AC
  Administered 2015-07-25: 750 mg via INTRAVENOUS

## 2015-07-25 MED ORDER — NYSTATIN 100000 UNIT/GM EX POWD
1.0000 g | Freq: Two times a day (BID) | CUTANEOUS | Status: DC
  Administered 2015-07-25 – 2015-07-26 (×4): 1 g via TOPICAL
  Filled 2015-07-25 (×2): qty 15

## 2015-07-25 MED ORDER — INSULIN ASPART 100 UNIT/ML ~~LOC~~ SOLN
0.0000 [IU] | Freq: Three times a day (TID) | SUBCUTANEOUS | Status: DC
  Administered 2015-07-25: 4 [IU] via SUBCUTANEOUS
  Administered 2015-07-25: 7 [IU] via SUBCUTANEOUS
  Administered 2015-07-26: 15 [IU] via SUBCUTANEOUS
  Administered 2015-07-26: 20 [IU] via SUBCUTANEOUS
  Administered 2015-07-26: 11 [IU] via SUBCUTANEOUS
  Administered 2015-07-27: 7 [IU] via SUBCUTANEOUS
  Administered 2015-07-27: 11 [IU] via SUBCUTANEOUS

## 2015-07-25 MED ORDER — ALBUTEROL SULFATE (2.5 MG/3ML) 0.083% IN NEBU
2.5000 mg | INHALATION_SOLUTION | Freq: Four times a day (QID) | RESPIRATORY_TRACT | Status: DC
  Administered 2015-07-25 – 2015-07-27 (×6): 2.5 mg via RESPIRATORY_TRACT
  Filled 2015-07-25 (×7): qty 3

## 2015-07-25 MED ORDER — OSELTAMIVIR PHOSPHATE 75 MG PO CAPS
75.0000 mg | ORAL_CAPSULE | Freq: Two times a day (BID) | ORAL | Status: DC
  Administered 2015-07-25 – 2015-07-27 (×5): 75 mg via ORAL
  Filled 2015-07-25 (×5): qty 1

## 2015-07-25 MED ORDER — FUROSEMIDE 10 MG/ML IJ SOLN
40.0000 mg | Freq: Two times a day (BID) | INTRAMUSCULAR | Status: DC
  Administered 2015-07-25 – 2015-07-26 (×4): 40 mg via INTRAVENOUS
  Filled 2015-07-25 (×4): qty 4

## 2015-07-25 MED ORDER — ALBUTEROL (5 MG/ML) CONTINUOUS INHALATION SOLN
15.0000 mg/h | INHALATION_SOLUTION | Freq: Once | RESPIRATORY_TRACT | Status: AC
Start: 2015-07-25 — End: 2015-07-25
  Administered 2015-07-25: 15 mg/h via RESPIRATORY_TRACT

## 2015-07-25 MED ORDER — ASPIRIN EC 81 MG PO TBEC
81.0000 mg | DELAYED_RELEASE_TABLET | Freq: Every day | ORAL | Status: DC
  Administered 2015-07-25 – 2015-07-27 (×3): 81 mg via ORAL
  Filled 2015-07-25 (×3): qty 1

## 2015-07-25 MED ORDER — DILTIAZEM HCL ER COATED BEADS 240 MG PO CP24
240.0000 mg | ORAL_CAPSULE | Freq: Every day | ORAL | Status: DC
  Administered 2015-07-25 – 2015-07-27 (×3): 240 mg via ORAL
  Filled 2015-07-25 (×3): qty 1

## 2015-07-25 MED ORDER — METHYLPREDNISOLONE SODIUM SUCC 40 MG IJ SOLR
40.0000 mg | Freq: Four times a day (QID) | INTRAMUSCULAR | Status: DC
  Administered 2015-07-25 – 2015-07-26 (×4): 40 mg via INTRAVENOUS
  Filled 2015-07-25 (×5): qty 1

## 2015-07-25 MED ORDER — GABAPENTIN 400 MG PO CAPS
1200.0000 mg | ORAL_CAPSULE | Freq: Two times a day (BID) | ORAL | Status: DC
  Administered 2015-07-25 – 2015-07-27 (×5): 1200 mg via ORAL
  Filled 2015-07-25 (×5): qty 3

## 2015-07-25 MED ORDER — LEVOFLOXACIN IN D5W 750 MG/150ML IV SOLN
750.0000 mg | INTRAVENOUS | Status: DC
  Administered 2015-07-26: 750 mg via INTRAVENOUS
  Filled 2015-07-25: qty 150

## 2015-07-25 MED ORDER — ALPRAZOLAM 1 MG PO TABS
1.0000 mg | ORAL_TABLET | Freq: Three times a day (TID) | ORAL | Status: DC | PRN
  Administered 2015-07-25 – 2015-07-27 (×4): 1 mg via ORAL
  Filled 2015-07-25: qty 2
  Filled 2015-07-25: qty 1
  Filled 2015-07-25 (×2): qty 2
  Filled 2015-07-25: qty 1

## 2015-07-25 MED ORDER — ONDANSETRON HCL 4 MG/2ML IJ SOLN
4.0000 mg | Freq: Four times a day (QID) | INTRAMUSCULAR | Status: DC | PRN
  Administered 2015-07-25: 4 mg via INTRAVENOUS
  Filled 2015-07-25 (×2): qty 2

## 2015-07-25 MED ORDER — OMEGA-3-ACID ETHYL ESTERS 1 G PO CAPS
1.0000 g | ORAL_CAPSULE | Freq: Every day | ORAL | Status: DC
  Administered 2015-07-25 – 2015-07-27 (×3): 1 g via ORAL
  Filled 2015-07-25 (×3): qty 1

## 2015-07-25 MED ORDER — SODIUM CHLORIDE 0.9% FLUSH
3.0000 mL | Freq: Two times a day (BID) | INTRAVENOUS | Status: DC
  Administered 2015-07-25 – 2015-07-26 (×2): 3 mL via INTRAVENOUS

## 2015-07-25 MED ORDER — NICOTINE 7 MG/24HR TD PT24
7.0000 mg | MEDICATED_PATCH | Freq: Every day | TRANSDERMAL | Status: DC
Start: 2015-07-25 — End: 2015-07-27
  Administered 2015-07-25 – 2015-07-27 (×3): 7 mg via TRANSDERMAL
  Filled 2015-07-25 (×6): qty 1

## 2015-07-25 MED ORDER — LEVOTHYROXINE SODIUM 50 MCG PO TABS
250.0000 ug | ORAL_TABLET | Freq: Every day | ORAL | Status: DC
  Administered 2015-07-26 – 2015-07-27 (×2): 250 ug via ORAL
  Filled 2015-07-25 (×3): qty 1

## 2015-07-25 MED ORDER — METHYLPREDNISOLONE SODIUM SUCC 125 MG IJ SOLR
125.0000 mg | Freq: Once | INTRAMUSCULAR | Status: AC
  Administered 2015-07-25: 125 mg via INTRAVENOUS

## 2015-07-25 MED ORDER — MENTHOL 3 MG MT LOZG: 1.0000 | LOZENGE | OROMUCOSAL | Status: DC | PRN

## 2015-07-25 MED ORDER — HYDROCODONE-HOMATROPINE 5-1.5 MG/5ML PO SYRP: 5.0000 mL | ORAL_SOLUTION | Freq: Four times a day (QID) | ORAL | Status: DC | PRN

## 2015-07-25 NOTE — ED Notes (Signed)
Pt's son in [place] [phone]

## 2015-07-25 NOTE — ED Notes (Signed)
Per EMS pt was trying to get out of bed and "eased down to floor" and was sitting on floor when they arrived. Pt states she began having generalized body aches, congestion, N/V/, and fever. Pt denies hitting her head.

## 2015-07-25 NOTE — ED Provider Notes (Signed)
CSN: [id number]     Arrival date & time 07/25/15  0620 History   First MD Initiated Contact with Patient 07/25/15 [phone]     Chief Complaint  Patient presents with  . Fall  . Generalized Body Aches     (Consider location/radiation/quality/duration/timing/severity/associated sxs/prior Treatment) HPI  Patient reports on March 13 she started feeling weak and having cold and hot spells. She states she feels drained. She has had sneezing and coughing that's dry. She states that evening she had a fever of 104. She's had nausea and vomiting "a couple times". She's had diarrhea once. She states she feels short of breath and she's been wheezing. She uses her nebulizers which helped temporarily. She states her chest hurts when she exerts herself. She denies any abdominal pain.   PCP Dr Sherrie Sport  Past Medical History  Diagnosis Date  . Hypertension   . Diabetes mellitus without complication ([place])   . COPD (chronic obstructive pulmonary disease) ([place])   . CHF (congestive heart failure) ([facility])     Diastolic  . Anxiety   . Chronic back pain   . Hypothyroidism   . Pedal edema   . On home O2     2.5 L N/C prn  . Degenerative disk disease    Past Surgical History  Procedure Laterality Date  . Tubal ligation    . Hernia repair     Family History  Problem Relation Age of Onset  . Stroke Mother   . Heart attack Father   . Diabetes Brother   . Cerebral palsy Brother   . Pneumonia Brother   . Diabetes Other   . Heart attack Other    Social History  Substance Use Topics  . Smoking status: Current Every Day Smoker -- 0.25 packs/day    Types: Cigarettes  . Smokeless tobacco: Never Used  . Alcohol Use: No   Lives at home  Lives alone Uses oxygen 2 lpm [place] Smokes 1/2 ppd  OB History    No data available     Review of Systems  All other systems reviewed and are negative.     Allergies  Dilaudid; Actifed cold-allergy; Doxycycline; Other; Penicillins; Reglan; Valium; and  Vistaril  Home Medications   Prior to Admission medications   Medication Sig Start Date End Date Taking? Authorizing Provider  albuterol (PROAIR HFA) 108 (90 BASE) MCG/ACT inhaler Inhale 1 puff into the lungs every 4 (four) hours as needed. For shortness of breath Patient taking differently: Inhale 2 puffs into the lungs every 4 (four) hours as needed. For shortness of breath 05/30/13   Kinnie Feil, MD  alprazolam Duanne Moron) 2 MG tablet Take 1 tablet (2 mg total) by mouth 3 (three) times daily as needed for anxiety. 11/10/14   Radene Gunning, NP  aspirin EC 81 MG tablet Take 81 mg by mouth daily.    Historical Provider, MD  diltiazem (CARDIZEM CD) 240 MG 24 hr capsule Take 1 capsule (240 mg total) by mouth daily. 05/30/13   Kinnie Feil, MD  empagliflozin (JARDIANCE) 25 MG TABS tablet Take 25 mg by mouth daily.    Historical Provider, MD  furosemide (LASIX) 20 MG tablet Take 2 tablets (40 mg total) by mouth 2 (two) times daily. 12/06/14   Erline Hau, MD  gabapentin (NEURONTIN) 400 MG capsule Take 3 capsules (1,200 mg total) by mouth 2 (two) times daily. 05/02/15   Orvan Falconer, MD  HYDROcodone-homatropine [facility]) 5-1.5 MG/5ML syrup Take 5 mLs by  mouth every 6 (six) hours as needed for cough. 05/02/15   Orvan Falconer, MD  insulin aspart (NOVOLOG) 100 UNIT/ML injection Inject 0-20 Units into the skin 3 (three) times daily with meals. Takes 20 Units in am and 20 Units at night.  Per sliding scale 05/30/13   Kinnie Feil, MD  insulin aspart (NOVOLOG) 100 UNIT/ML injection Inject 12 Units into the skin 3 (three) times daily with meals. 05/02/15   Orvan Falconer, MD  insulin detemir (LEVEMIR) 100 UNIT/ML injection Inject 0.2 mLs (20 Units total) into the skin at bedtime. Patient taking differently: Inject 40 Units into the skin at bedtime.  12/06/14   Erline Hau, MD  levofloxacin (LEVAQUIN) 500 MG tablet Take 1 tablet (500 mg total) by mouth daily. 05/02/15   Orvan Falconer, MD   levothyroxine (SYNTHROID, LEVOTHROID) 125 MCG tablet Take 2 tablets (250 mcg total) by mouth daily before breakfast. 05/30/13   Kinnie Feil, MD  menthol-cetylpyridinium (CEPACOL) 3 MG lozenge Take 1 lozenge (3 mg total) by mouth as needed for sore throat. 03/25/15   Donne Hazel, MD  metFORMIN (GLUCOPHAGE) 500 MG tablet Take 500 mg by mouth 2 (two) times daily with a meal.    Historical Provider, MD  nicotine (NICODERM CQ - DOSED IN MG/24 HOURS) 21 mg/24hr patch Place 1 patch (21 mg total) onto the skin daily. 05/30/13   Kinnie Feil, MD  nicotine (NICODERM CQ - DOSED IN MG/24 HR) 7 mg/24hr patch Place 1 patch (7 mg total) onto the skin daily. 05/02/15   Orvan Falconer, MD  nystatin (MYCOSTATIN/NYSTOP) 100000 UNIT/GM POWD Apply 1 g topically 2 (two) times daily.    Historical Provider, MD  Omega-3 Fatty Acids (FISH OIL PO) Take 1 capsule by mouth 2 (two) times daily.     Historical Provider, MD  ondansetron (ZOFRAN ODT) 8 MG disintegrating tablet Take 1 tablet (8 mg total) by mouth every 8 (eight) hours as needed for nausea or vomiting. 12/31/13   Jola Schmidt, MD  oxyCODONE-acetaminophen (PERCOCET/ROXICET) 5-325 MG tablet Take 2 tablets by mouth every 6 (six) hours as needed for moderate pain. 05/02/15   Orvan Falconer, MD  pantoprazole (PROTONIX) 40 MG tablet Take 1 tablet (40 mg total) by mouth 2 (two) times daily. 11/10/14   Radene Gunning, NP  potassium chloride (K-DUR) 10 MEQ tablet Take 20 mEq by mouth 2 (two) times daily.     Historical Provider, MD  predniSONE (DELTASONE) 20 MG tablet Take 1.5 tablets (30 mg total) by mouth daily with breakfast. 05/02/15   Orvan Falconer, MD  saccharomyces boulardii (FLORASTOR) 250 MG capsule Take 1 capsule (250 mg total) by mouth 2 (two) times daily. 03/25/15   Donne Hazel, MD  tiZANidine (ZANAFLEX) 4 MG tablet Take 4 mg by mouth 4 (four) times daily.    Historical Provider, MD   BP 133/56 mmHg  Pulse 105  Temp(Src) 99.9 F (37.7 C) (Rectal)  Resp 20  SpO2  90%  Vital signs normal   Physical Exam  Constitutional: She is oriented to person, place, and time. She appears well-developed and well-nourished.  Non-toxic appearance. She does not appear ill. She appears distressed.  Appears weak  HENT:  Head: Normocephalic and atraumatic.  Right Ear: External ear normal.  Left Ear: External ear normal.  Nose: Nose normal. No mucosal edema or rhinorrhea.  Mouth/Throat: Oropharynx is clear and moist and mucous membranes are normal. No dental abscesses or uvula swelling.  Eyes: Conjunctivae and  EOM are normal. Pupils are equal, round, and reactive to light.  Neck: Normal range of motion and full passive range of motion without pain. Neck supple.  Cardiovascular: Normal rate, regular rhythm and normal heart sounds.  Exam reveals no gallop and no friction rub.   No murmur heard. Pulmonary/Chest: Tachypnea noted. She is in respiratory distress. She has decreased breath sounds. She has no wheezes. She has rhonchi. She has no rales. She exhibits no tenderness and no crepitus.  Abdominal: Soft. Normal appearance and bowel sounds are normal. She exhibits no distension. There is no tenderness. There is no rebound and no guarding.  Musculoskeletal: Normal range of motion. She exhibits no edema or tenderness.  Moves all extremities well.   Neurological: She is alert and oriented to person, place, and time. She has normal strength. No cranial nerve deficit.  Skin: Skin is warm, dry and intact. No rash noted. No erythema. No pallor.  Psychiatric: She has a normal mood and affect. Her speech is normal and behavior is normal. Her mood appears not anxious.  Nursing note and vitals reviewed.   ED Course  Procedures (including critical care time)  Medications  albuterol (PROVENTIL,VENTOLIN) solution continuous neb (not administered)  ipratropium (ATROVENT) nebulizer solution 0.5 mg (not administered)  levofloxacin (LEVAQUIN) IVPB 750 mg (not administered)   methylPREDNISolone sodium succinate (SOLU-MEDROL) 125 mg/2 mL injection 125 mg (not administered)  sodium chloride 0.9 % bolus 1,000 mL (not administered)  sodium chloride 0.9 % bolus 1,000 mL (not administered)   Patient was given IV fluids. She was given a continuous nebulizer. After reviewing her x-ray she was started on Levaquin.  Patient has COPD and is on home oxygen and continues to smoke. She has new bibasilar infiltrates. She appears to be very weak and having some shortness of breath. She was admitted.  07:43 Dr Marin Comment, will admit.  Labs Review Results for orders placed or performed during the hospital encounter of 07/25/15  Comprehensive metabolic panel  Result Value Ref Range   Sodium 139 135 - 145 mmol/L   Potassium 4.5 3.5 - 5.1 mmol/L   Chloride 103 101 - 111 mmol/L   CO2 29 22 - 32 mmol/L   Glucose, Bld 134 (H) 65 - 99 mg/dL   BUN 26 (H) 6 - 20 mg/dL   Creatinine, Ser 1.57 (H) 0.44 - 1.00 mg/dL   Calcium 8.4 (L) 8.9 - 10.3 mg/dL   Total Protein 6.9 6.5 - 8.1 g/dL   Albumin 3.6 3.5 - 5.0 g/dL   AST 17 15 - 41 U/L   ALT 10 (L) 14 - 54 U/L   Alkaline Phosphatase 90 38 - 126 U/L   Total Bilirubin 0.5 0.3 - 1.2 mg/dL   GFR calc non Af Amer 34 (L) >60 mL/min   GFR calc Af Amer 39 (L) >60 mL/min   Anion gap 7 5 - 15  CBC with Differential  Result Value Ref Range   WBC 7.6 4.0 - 10.5 K/uL   RBC 4.51 3.87 - 5.11 MIL/uL   Hemoglobin 11.6 (L) 12.0 - 15.0 g/dL   HCT 38.7 36.0 - 46.0 %   MCV 85.8 78.0 - 100.0 fL   MCH 25.7 (L) 26.0 - 34.0 pg   MCHC 30.0 30.0 - 36.0 g/dL   RDW 17.0 (H) 11.5 - 15.5 %   Platelets 260 150 - 400 K/uL   Neutrophils Relative % 78 %   Neutro Abs 6.0 1.7 - 7.7 K/uL   Lymphocytes Relative 12 %  Lymphs Abs 0.9 0.7 - 4.0 K/uL   Monocytes Relative 8 %   Monocytes Absolute 0.6 0.1 - 1.0 K/uL   Eosinophils Relative 1 %   Eosinophils Absolute 0.0 0.0 - 0.7 K/uL   Basophils Relative 1 %   Basophils Absolute 0.1 0.0 - 0.1 K/uL  CBG monitoring, ED   Result Value Ref Range   Glucose-Capillary 123 (H) 65 - 99 mg/dL   Laboratory interpretation all normal except mild anemia, renal insufficiency     Imaging Review Dg Chest Port 1 View  07/25/2015  CLINICAL DATA:  65 year old female with a history of fever and cough for 3 days. EXAM: PORTABLE CHEST 1 VIEW COMPARISON:  04/30/2015 FINDINGS: Significant right rotation and positioning of the patient limits evaluation. Ill-defined interstitial and airspace opacities at the bilateral lung bases. No definite pneumothorax.  No large pleural effusion. Cardiomediastinal silhouette likely unchanged, though evaluation is limited. IMPRESSION: Limited chest x-ray demonstrates ill-defined basilar airspace and interstitial opacities, potentially atelectasis, edema, and/ or developing infection. If there is ongoing concern for more specific evaluation, a formal PA and lateral chest x-ray with improved position may be useful. Signed, Dulcy Fanny. Earleen Newport, DO [facility] Electronically Signed   By: Corrie Mckusick D.O.   On: 07/25/2015 07:02   I have personally reviewed and evaluated these images and lab results as part of my medical decision-making.    MDM   Final diagnoses:  CAP (community acquired pneumonia)  Weakness   Plan admission  Rolland Porter, MD, Barbette Or, MD 07/25/15 [phone]

## 2015-07-25 NOTE — ED Notes (Signed)
EMS reported pt was 97% on RA PTA. Pt reports being on 2L [place] at home, O2 saturation in 80's on RA, pt put on 4L [place] and now 94%

## 2015-07-25 NOTE — H&P (Signed)
[facility] History and Physical  Stephanie Combs TMH:[id number] DOB: [birth year]    PCP:   Neale Burly, MD   Chief Complaint: cough, SOB, and chills.   HPI: Stephanie Combs is an 65 y.o. female with hx of COPD on home oxygen, DM with labile BS, hypothyrodism, CHF, anxiety, whom I discharged in Dec after COPD exacerbation, presented to the ER with chills, SOB, subjective fever, and coughs.  She has some myalgia as well.  She did not have CP, abdominal pain, nausea, vomiting, or diarrhea.  She has no ill contact or distant travel.  Evalaution in the ER showed CXR with possible infiltrate, WBC of 6K, normal Hb and Cr of 1,57.  She was given neb, IV levoquin, and hospitalist was asked to admit her for COPD exacerbation, and possible CAP.   Rewiew of Systems:  Constitutional: Negative for malaise, fever and chills. No significant weight loss or weight gain Eyes: Negative for eye pain, redness and discharge, diplopia, visual changes, or flashes of light. ENMT: Negative for ear pain, hoarseness, nasal congestion, sinus pressure and sore throat. No headaches; tinnitus, drooling, or problem swallowing. Cardiovascular: Negative for chest pain, palpitations, diaphoresis, dyspnea and peripheral edema. ; No orthopnea, PND Respiratory: Negative for  hemoptysis,  and stridor. No pleuritic chestpain. Gastrointestinal: Negative for nausea, vomiting, diarrhea, constipation, abdominal pain, melena, blood in stool, hematemesis, jaundice and rectal bleeding.    Genitourinary: Negative for frequency, dysuria, incontinence,flank pain and hematuria; Musculoskeletal: Negative for back pain and neck pain. Negative for swelling and trauma.;  Skin: . Negative for pruritus, rash, abrasions, bruising and skin lesion.; ulcerations Neuro: Negative for headache, lightheadedness and neck stiffness. Negative for weakness, altered level of consciousness , altered mental status, extremity weakness, burning feet,  involuntary movement, seizure and syncope.  Psych: negative for anxiety, depression, insomnia, tearfulness, panic attacks, hallucinations, paranoia, suicidal or homicidal ideation   Past Medical History  Diagnosis Date  . Hypertension   . Diabetes mellitus without complication ([place])   . COPD (chronic obstructive pulmonary disease) ([place])   . CHF (congestive heart failure) ([facility])     Diastolic  . Anxiety   . Chronic back pain   . Hypothyroidism   . Pedal edema   . On home O2     2.5 L N/C prn  . Degenerative disk disease     Past Surgical History  Procedure Laterality Date  . Tubal ligation    . Hernia repair      Medications:  HOME MEDS: Prior to Admission medications   Medication Sig Start Date End Date Taking? Authorizing Provider  albuterol (PROAIR HFA) 108 (90 BASE) MCG/ACT inhaler Inhale 1 puff into the lungs every 4 (four) hours as needed. For shortness of breath Patient taking differently: Inhale 2 puffs into the lungs every 4 (four) hours as needed. For shortness of breath 05/30/13   Kinnie Feil, MD  alprazolam Duanne Moron) 2 MG tablet Take 1 tablet (2 mg total) by mouth 3 (three) times daily as needed for anxiety. 11/10/14   Radene Gunning, NP  aspirin EC 81 MG tablet Take 81 mg by mouth daily.    Historical Provider, MD  diltiazem (CARDIZEM CD) 240 MG 24 hr capsule Take 1 capsule (240 mg total) by mouth daily. 05/30/13   Kinnie Feil, MD  empagliflozin (JARDIANCE) 25 MG TABS tablet Take 25 mg by mouth daily.    Historical Provider, MD  furosemide (LASIX) 20 MG tablet Take 2 tablets (40 mg total)  by mouth 2 (two) times daily. 12/06/14   Erline Hau, MD  gabapentin (NEURONTIN) 400 MG capsule Take 3 capsules (1,200 mg total) by mouth 2 (two) times daily. 05/02/15   Orvan Falconer, MD  HYDROcodone-homatropine [facility]) 5-1.5 MG/5ML syrup Take 5 mLs by mouth every 6 (six) hours as needed for cough. 05/02/15   Orvan Falconer, MD  insulin aspart (NOVOLOG) 100 UNIT/ML injection  Inject 0-20 Units into the skin 3 (three) times daily with meals. Takes 20 Units in am and 20 Units at night.  Per sliding scale 05/30/13   Kinnie Feil, MD  insulin aspart (NOVOLOG) 100 UNIT/ML injection Inject 12 Units into the skin 3 (three) times daily with meals. 05/02/15   Orvan Falconer, MD  insulin detemir (LEVEMIR) 100 UNIT/ML injection Inject 0.2 mLs (20 Units total) into the skin at bedtime. Patient taking differently: Inject 40 Units into the skin at bedtime.  12/06/14   Erline Hau, MD  levofloxacin (LEVAQUIN) 500 MG tablet Take 1 tablet (500 mg total) by mouth daily. 05/02/15   Orvan Falconer, MD  levothyroxine (SYNTHROID, LEVOTHROID) 125 MCG tablet Take 2 tablets (250 mcg total) by mouth daily before breakfast. 05/30/13   Kinnie Feil, MD  menthol-cetylpyridinium (CEPACOL) 3 MG lozenge Take 1 lozenge (3 mg total) by mouth as needed for sore throat. 03/25/15   Donne Hazel, MD  metFORMIN (GLUCOPHAGE) 500 MG tablet Take 500 mg by mouth 2 (two) times daily with a meal.    Historical Provider, MD  nicotine (NICODERM CQ - DOSED IN MG/24 HOURS) 21 mg/24hr patch Place 1 patch (21 mg total) onto the skin daily. 05/30/13   Kinnie Feil, MD  nicotine (NICODERM CQ - DOSED IN MG/24 HR) 7 mg/24hr patch Place 1 patch (7 mg total) onto the skin daily. 05/02/15   Orvan Falconer, MD  nystatin (MYCOSTATIN/NYSTOP) 100000 UNIT/GM POWD Apply 1 g topically 2 (two) times daily.    Historical Provider, MD  Omega-3 Fatty Acids (FISH OIL PO) Take 1 capsule by mouth 2 (two) times daily.     Historical Provider, MD  ondansetron (ZOFRAN ODT) 8 MG disintegrating tablet Take 1 tablet (8 mg total) by mouth every 8 (eight) hours as needed for nausea or vomiting. 12/31/13   Jola Schmidt, MD  oxyCODONE-acetaminophen (PERCOCET/ROXICET) 5-325 MG tablet Take 2 tablets by mouth every 6 (six) hours as needed for moderate pain. 05/02/15   Orvan Falconer, MD  pantoprazole (PROTONIX) 40 MG tablet Take 1 tablet (40 mg total) by  mouth 2 (two) times daily. 11/10/14   Radene Gunning, NP  potassium chloride (K-DUR) 10 MEQ tablet Take 20 mEq by mouth 2 (two) times daily.     Historical Provider, MD  predniSONE (DELTASONE) 20 MG tablet Take 1.5 tablets (30 mg total) by mouth daily with breakfast. 05/02/15   Orvan Falconer, MD  saccharomyces boulardii (FLORASTOR) 250 MG capsule Take 1 capsule (250 mg total) by mouth 2 (two) times daily. 03/25/15   Donne Hazel, MD  tiZANidine (ZANAFLEX) 4 MG tablet Take 4 mg by mouth 4 (four) times daily.    Historical Provider, MD     Allergies:  Allergies  Allergen Reactions  . Dilaudid [Hydromorphone Hcl] Itching  . Actifed Cold-Allergy [Chlorpheniramine-Phenylephrine]     "I was sick and red and it didn't agree with me at all"  . Doxycycline Nausea And Vomiting  . Other Cough    Pt states she is allergic to ragweed and that  she starts coughing and sneezing like crazy  . Penicillins Hives and Itching    Has patient had a PCN reaction causing immediate rash, facial/tongue/throat swelling, SOB or lightheadedness with hypotension: Yes Has patient had a PCN reaction causing severe rash involving mucus membranes or skin necrosis: No Has patient had a PCN reaction that required hospitalization Yes Has patient had a PCN reaction occurring within the last 10 years: No If all of the above answers are "NO", then may proceed with Cephalosporin use.   . Reglan [Metoclopramide] Itching  . Valium [Diazepam] Itching  . Vistaril [Hydroxyzine Hcl] Itching    Social History:   reports that she has been smoking Cigarettes.  She has been smoking about 0.25 packs per day. She has never used smokeless tobacco. She reports that she does not drink alcohol or use illicit drugs.  Family History: Family History  Problem Relation Age of Onset  . Stroke Mother   . Heart attack Father   . Diabetes Brother   . Cerebral palsy Brother   . Pneumonia Brother   . Diabetes Other   . Heart attack Other       Physical Exam: Filed Vitals:   07/25/15 0629 07/25/15 0630 07/25/15 0800 07/25/15 0812  BP: 97/72 133/56 123/57   Pulse: 105 105 99   Temp: 99.9 F (37.7 C)     TempSrc: Rectal     Resp: 20  23   SpO2: 92% 90% 96% 96%   Blood pressure 123/57, pulse 99, temperature 99.9 F (37.7 C), temperature source Rectal, resp. rate 23, SpO2 96 %.  GEN:  Pleasant  patient lying in the stretcher in no acute distress; cooperative with exam. PSYCH:  alert and oriented x4; does not appear anxious or depressed; affect is appropriate. HEENT: Mucous membranes pink and anicteric; PERRLA; EOM intact; no cervical lymphadenopathy nor thyromegaly or carotid bruit; no JVD; There were no stridor. Neck is very supple. Breasts:: Not examined CHEST WALL: No tenderness CHEST: Normal respiration, wheezing bilaterally.  No rales.  HEART: Regular rate and rhythm.  There are no murmur, rub, or gallops.   BACK: No kyphosis or scoliosis; no CVA tenderness ABDOMEN: soft and non-tender; no masses, no organomegaly, normal abdominal bowel sounds; no pannus; no intertriginous candida. There is no rebound and no distention. Rectal Exam: Not done EXTREMITIES: No bone or joint deformity; age-appropriate arthropathy of the hands and knees; no edema; no ulcerations.  There is no calf tenderness. Genitalia: not examined PULSES: 2+ and symmetric SKIN: Normal hydration no rash or ulceration CNS: Cranial nerves 2-12 grossly intact no focal lateralizing neurologic deficit.  Speech is fluent; uvula elevated with phonation, facial symmetry and tongue midline. DTR are normal bilaterally, cerebella exam is intact, barbinski is negative and strengths are equaled bilaterally.  No sensory loss.   Labs on Admission:  Basic Metabolic Panel:  Recent Labs Lab 07/25/15 0640  NA 139  K 4.5  CL 103  CO2 29  GLUCOSE 134*  BUN 26*  CREATININE 1.57*  CALCIUM 8.4*   Liver Function Tests:  Recent Labs Lab 07/25/15 0640  AST 17   ALT 10*  ALKPHOS 90  BILITOT 0.5  PROT 6.9  ALBUMIN 3.6   CBC:  Recent Labs Lab 07/25/15 0640  WBC 7.6  NEUTROABS 6.0  HGB 11.6*  HCT 38.7  MCV 85.8  PLT 260   Cardiac Enzymes: No results for input(s): CKTOTAL, CKMB, CKMBINDEX, TROPONINI in the last 168 hours.  CBG:  Recent Labs Lab 07/25/15  [place] on Admission: Dg Chest Port 1 View  07/25/2015  CLINICAL DATA:  65 year old female with a history of fever and cough for 3 days. EXAM: PORTABLE CHEST 1 VIEW COMPARISON:  04/30/2015 FINDINGS: Significant right rotation and positioning of the patient limits evaluation. Ill-defined interstitial and airspace opacities at the bilateral lung bases. No definite pneumothorax.  No large pleural effusion. Cardiomediastinal silhouette likely unchanged, though evaluation is limited. IMPRESSION: Limited chest x-ray demonstrates ill-defined basilar airspace and interstitial opacities, potentially atelectasis, edema, and/ or developing infection. If there is ongoing concern for more specific evaluation, a formal PA and lateral chest x-ray with improved position may be useful. Signed, Dulcy Fanny. Earleen Newport, DO [facility] Electronically Signed   By: Corrie Mckusick D.O.   On: 07/25/2015 07:02    Assessment/Plan Present on Admission:  . COPD (chronic obstructive pulmonary disease) ([place]) . CKD (chronic kidney disease), stage III . Chronic diastolic CHF (congestive heart failure) ([place]) . HTN (hypertension), benign . Morbid obesity ([place]) . Peripheral edema  PLAN:  COPD exacerbation with possible CAP:  I suspect she may have influenza.  Place on droplet, start Tamiflu pending flu swab.  Will continue with IV Levoquin, oxygen, nebs, and IV steroids.  DM2: She will likely have hyperglycemia as with last admission.  WIll use SSI resistant scale.    CKD:  Mild with stable Cr.    CHF:  No IVF.  Will continue  Lasix in IV form BID.  Follow Cr carefully.   Other plans as per orders. Code Status: DNR.    Orvan Falconer, MD. FACP [facility] Pager [phone] 7pm to 7am.  07/25/2015, 8:30 AM

## 2015-07-26 ENCOUNTER — Telehealth (HOSPITAL_COMMUNITY): Payer: Self-pay | Admitting: Physical Therapy

## 2015-07-26 ENCOUNTER — Telehealth (HOSPITAL_COMMUNITY): Payer: Self-pay

## 2015-07-26 ENCOUNTER — Ambulatory Visit (HOSPITAL_COMMUNITY): Payer: Medicare Other | Admitting: Physical Therapy

## 2015-07-26 DIAGNOSIS — J438 Other emphysema: Secondary | ICD-10-CM

## 2015-07-26 LAB — BASIC METABOLIC PANEL
Anion gap: 9 (ref 5–15)
BUN: 35 mg/dL — ABNORMAL HIGH (ref 6–20)
CO2: 28 mmol/L (ref 22–32)
Calcium: 8 mg/dL — ABNORMAL LOW (ref 8.9–10.3)
Chloride: 101 mmol/L (ref 101–111)
Creatinine, Ser: 1.53 mg/dL — ABNORMAL HIGH (ref 0.44–1.00)
GFR calc Af Amer: 40 mL/min — ABNORMAL LOW (ref >60)
GFR calc non Af Amer: 35 mL/min — ABNORMAL LOW (ref >60)
Glucose, Bld: 307 mg/dL — ABNORMAL HIGH (ref 65–99)
Potassium: 4.5 mmol/L (ref 3.5–5.1)
Sodium: 138 mmol/L (ref 135–145)

## 2015-07-26 LAB — GLUCOSE, CAPILLARY
Glucose-Capillary: 362 mg/dL — ABNORMAL HIGH (ref 65–99)
Glucose-Capillary: 347 mg/dL — ABNORMAL HIGH (ref 65–99)
Glucose-Capillary: 367 mg/dL — ABNORMAL HIGH (ref 65–99)
Glucose-Capillary: 267 mg/dL — ABNORMAL HIGH (ref 65–99)
Glucose-Capillary: 224 mg/dL — ABNORMAL HIGH (ref 65–99)

## 2015-07-26 LAB — URINE CULTURE: Culture: NO GROWTH

## 2015-07-26 LAB — CBC
HCT: 35.4 % — ABNORMAL LOW (ref 36.0–46.0)
Hemoglobin: 10.7 g/dL — ABNORMAL LOW (ref 12.0–15.0)
MCH: 26.2 pg (ref 26.0–34.0)
MCHC: 30.2 g/dL (ref 30.0–36.0)
MCV: 86.8 fL (ref 78.0–100.0)
Platelets: 232 10*3/uL (ref 150–400)
RBC: 4.08 MIL/uL (ref 3.87–5.11)
RDW: 16.8 % — ABNORMAL HIGH (ref 11.5–15.5)
WBC: 4.6 10*3/uL (ref 4.0–10.5)

## 2015-07-26 LAB — MRSA PCR SCREENING: MRSA by PCR: NEGATIVE

## 2015-07-26 MED ORDER — OSELTAMIVIR PHOSPHATE 75 MG PO CAPS: 75.0000 mg | ORAL_CAPSULE | Freq: Two times a day (BID) | ORAL | Status: DC

## 2015-07-26 MED ORDER — FLEET ENEMA 7-19 GM/118ML RE ENEM: 1.0000 | ENEMA | Freq: Once | RECTAL | Status: DC

## 2015-07-26 MED ORDER — FUROSEMIDE 40 MG PO TABS
40.0000 mg | ORAL_TABLET | Freq: Every day | ORAL | Status: DC
  Administered 2015-07-27: 40 mg via ORAL
  Filled 2015-07-26: qty 1

## 2015-07-26 MED ORDER — PREDNISONE 20 MG PO TABS
40.0000 mg | ORAL_TABLET | Freq: Every day | ORAL | Status: DC
  Administered 2015-07-27: 40 mg via ORAL
  Filled 2015-07-26: qty 2

## 2015-07-26 MED ORDER — INSULIN DETEMIR 100 UNIT/ML ~~LOC~~ SOLN: 20.0000 [IU] | Freq: Every day | SUBCUTANEOUS | Status: DC

## 2015-07-26 MED ORDER — MAGNESIUM CITRATE PO SOLN: 0.5000 | Freq: Once | ORAL | Status: DC

## 2015-07-26 MED ORDER — ALPRAZOLAM 1 MG PO TABS: 1.0000 mg | ORAL_TABLET | Freq: Three times a day (TID) | ORAL | Status: DC | PRN

## 2015-07-26 MED ORDER — MILK AND MOLASSES ENEMA
1.0000 | Freq: Once | RECTAL | Status: AC
  Administered 2015-07-26: 250 mL via RECTAL

## 2015-07-26 NOTE — Telephone Encounter (Signed)
Wendee Copp called to talk with Jenny Reichmann concerning pt ICU #7. Jenny Reichmann was at the hospital so I called her cell and requested that she speak with Purcell Nails concerning pt. NF 9:40 am

## 2015-07-26 NOTE — Telephone Encounter (Signed)
Pt did not show for appt.  Friend of patient called to report she had been admitted into the hospital yesterday with possible Flu.  Teena Irani, PTA/CLT [phone]

## 2015-07-26 NOTE — Care Management Note (Signed)
Case Management Note  Patient Details  Name: Stephanie Combs MRN: [id number] Date of Birth: [birth year]   Subjective/Objective:                  Pt admitted with CAP. Lives at home alone and receives support from her children. Pt has home O2 with port tanks that she uses PRN. Pt has neb machine and walker PTA. Pt says her walker is very old and she needs a new one. Pt would like walker to come from [place]. Pt has no HH services prior to admission. Pt has no difficulty obtaining her medications. Pt states he children make sure she gets to her appointments.   Action/Plan: Pt plans to return home with self care. Pt will be ordered walker and order will be faxed to [facility]. Pt will pick walker up after discharge from hospital. No further CM needs anticipated.   Expected Discharge Date:  07/28/15               Expected Discharge Plan:  Home/Self Care  In-House Referral:  NA  Discharge planning Services  CM Consult  Post Acute Care Choice:  Durable Medical Equipment Choice offered to:  Patient  DME Arranged:  Gilford Rile rolling DME Agency:  [employer]  HH Arranged:    [facility] Agency:     Status of Service:  Completed, signed off  Medicare Important Message Given:    Date Medicare IM Given:    Medicare IM give by:    Date Additional Medicare IM Given:    Additional Medicare Important Message give by:     If discussed at [place] of Stay Meetings, dates discussed:    Additional Comments:  Sherald Barge, RN 07/26/2015, 1:21 PM

## 2015-07-26 NOTE — Progress Notes (Signed)
Pt states she does not feel she is well enough to d/c home and demands enema for constipation. Dr. Marin Comment in room to assess pt's requests.

## 2015-07-26 NOTE — Telephone Encounter (Signed)
She is in the ICU at [facility], neighbor called last night to cx apptement, I could not cx due to power failure, Amy Dagoberto Reef is aware of issue. NF

## 2015-07-26 NOTE — Progress Notes (Signed)
[facility] PROGRESS NOTE  BASIL BLAKESLEY ELF:[id number] DOB: January 09, 1951    PCP:   Neale Burly, MD   HPI: Stephanie Combs is an 65 y.o. female admitted yesterday for URI, SOB, ? CAP, and was found to have influenza.  She was started on Tamiflu and placed on Droplet precaution upon admission.  She is breathing better.  Rewiew of Systems:  Constitutional: Negative for malaise, fever and chills. No significant weight loss or weight gain Eyes: Negative for eye pain, redness and discharge, diplopia, visual changes, or flashes of light. ENMT: Negative for ear pain, hoarseness, nasal congestion, sinus pressure and sore throat. No headaches; tinnitus, drooling, or problem swallowing. Cardiovascular: Negative for chest pain, palpitations, diaphoresis, dyspnea and peripheral edema. ; No orthopnea, PND Respiratory: Negative for cough, hemoptysis, wheezing and stridor. No pleuritic chestpain. Gastrointestinal: Negative for nausea, vomiting, diarrhea, constipation, abdominal pain, melena, blood in stool, hematemesis, jaundice and rectal bleeding.    Genitourinary: Negative for frequency, dysuria, incontinence,flank pain and hematuria; Musculoskeletal: Negative for back pain and neck pain. Negative for swelling and trauma.;  Skin: . Negative for pruritus, rash, abrasions, bruising and skin lesion.; ulcerations Neuro: Negative for headache, lightheadedness and neck stiffness. Negative for weakness, altered level of consciousness , altered mental status, extremity weakness, burning feet, involuntary movement, seizure and syncope.  Psych: negative for anxiety, depression, insomnia, tearfulness, panic attacks, hallucinations, paranoia, suicidal or homicidal ideation    Past Medical History  Diagnosis Date  . Hypertension   . Diabetes mellitus without complication ([place])   . COPD (chronic obstructive pulmonary disease) ([place])   . CHF (congestive heart failure) ([facility])     Diastolic  . Anxiety   .  Chronic back pain   . Hypothyroidism   . Pedal edema   . On home O2     2.5 L N/C prn  . Degenerative disk disease     Past Surgical History  Procedure Laterality Date  . Tubal ligation    . Hernia repair      Medications:  HOME MEDS: Prior to Admission medications   Medication Sig Start Date End Date Taking? Authorizing Provider  albuterol (PROAIR HFA) 108 (90 BASE) MCG/ACT inhaler Inhale 1 puff into the lungs every 4 (four) hours as needed. For shortness of breath Patient taking differently: Inhale 2 puffs into the lungs every 4 (four) hours as needed. For shortness of breath 05/30/13  Yes Kinnie Feil, MD  alprazolam Duanne Moron) 2 MG tablet Take 1 tablet (2 mg total) by mouth 3 (three) times daily as needed for anxiety. 11/10/14  Yes Radene Gunning, NP  aspirin EC 81 MG tablet Take 81 mg by mouth daily.   Yes Historical Provider, MD  diltiazem (CARDIZEM CD) 240 MG 24 hr capsule Take 1 capsule (240 mg total) by mouth daily. 05/30/13  Yes Kinnie Feil, MD  empagliflozin (JARDIANCE) 25 MG TABS tablet Take 25 mg by mouth daily.   Yes Historical Provider, MD  furosemide (LASIX) 20 MG tablet Take 2 tablets (40 mg total) by mouth 2 (two) times daily. 12/06/14  Yes Estela Leonie Green, MD  gabapentin (NEURONTIN) 400 MG capsule Take 3 capsules (1,200 mg total) by mouth 2 (two) times daily. 05/02/15  Yes Orvan Falconer, MD  insulin aspart (NOVOLOG) 100 UNIT/ML injection Inject 0-20 Units into the skin 3 (three) times daily with meals. Takes 20 Units in am and 20 Units at night.  Per sliding scale 05/30/13  Yes Kinnie Feil, MD  insulin detemir (LEVEMIR) 100 UNIT/ML injection Inject 0.2 mLs (20 Units total) into the skin at bedtime. Patient taking differently: Inject 40 Units into the skin 2 (two) times daily.  12/06/14  Yes Estela Leonie Green, MD  levothyroxine (SYNTHROID, LEVOTHROID) 125 MCG tablet Take 2 tablets (250 mcg total) by mouth daily before breakfast. 05/30/13  Yes Kinnie Feil, MD  menthol-cetylpyridinium (CEPACOL) 3 MG lozenge Take 1 lozenge (3 mg total) by mouth as needed for sore throat. 03/25/15  Yes Donne Hazel, MD  metFORMIN (GLUCOPHAGE) 500 MG tablet Take 500 mg by mouth 2 (two) times daily with a meal.   Yes Historical Provider, MD  nicotine (NICODERM CQ - DOSED IN MG/24 HOURS) 21 mg/24hr patch Place 1 patch (21 mg total) onto the skin daily. 05/30/13  Yes Kinnie Feil, MD  nystatin (MYCOSTATIN/NYSTOP) 100000 UNIT/GM POWD Apply 1 g topically 2 (two) times daily.   Yes Historical Provider, MD  Omega-3 Fatty Acids (FISH OIL PO) Take 1 capsule by mouth 2 (two) times daily.    Yes Historical Provider, MD  ondansetron (ZOFRAN ODT) 8 MG disintegrating tablet Take 1 tablet (8 mg total) by mouth every 8 (eight) hours as needed for nausea or vomiting. 12/31/13  Yes Jola Schmidt, MD  oxyCODONE-acetaminophen (PERCOCET/ROXICET) 5-325 MG tablet Take 2 tablets by mouth every 6 (six) hours as needed for moderate pain. 05/02/15  Yes Orvan Falconer, MD  potassium chloride (K-DUR) 10 MEQ tablet Take 20 mEq by mouth 2 (two) times daily.    Yes Historical Provider, MD  tiZANidine (ZANAFLEX) 4 MG tablet Take 4 mg by mouth 4 (four) times daily.   Yes Historical Provider, MD  ALPRAZolam Duanne Moron) 1 MG tablet Take 1 tablet (1 mg total) by mouth 3 (three) times daily as needed for anxiety. 07/26/15   Orvan Falconer, MD  HYDROcodone-homatropine [facility]) 5-1.5 MG/5ML syrup Take 5 mLs by mouth every 6 (six) hours as needed for cough. Patient not taking: Reported on 07/25/2015 05/02/15   Orvan Falconer, MD  insulin aspart (NOVOLOG) 100 UNIT/ML injection Inject 12 Units into the skin 3 (three) times daily with meals. Patient not taking: Reported on 07/25/2015 05/02/15   Orvan Falconer, MD  insulin detemir (LEVEMIR) 100 UNIT/ML injection Inject 0.2 mLs (20 Units total) into the skin at bedtime. 07/26/15   Orvan Falconer, MD  levofloxacin (LEVAQUIN) 500 MG tablet Take 1 tablet (500 mg total) by mouth daily. Patient  not taking: Reported on 07/25/2015 05/02/15   Orvan Falconer, MD  nicotine (NICODERM CQ - DOSED IN MG/24 HR) 7 mg/24hr patch Place 1 patch (7 mg total) onto the skin daily. 05/02/15   Orvan Falconer, MD  oseltamivir (TAMIFLU) 75 MG capsule Take 1 capsule (75 mg total) by mouth 2 (two) times daily. 07/26/15   Orvan Falconer, MD  pantoprazole (PROTONIX) 40 MG tablet Take 1 tablet (40 mg total) by mouth 2 (two) times daily. Patient not taking: Reported on 07/25/2015 11/10/14   Radene Gunning, NP  predniSONE (DELTASONE) 20 MG tablet Take 1.5 tablets (30 mg total) by mouth daily with breakfast. Patient not taking: Reported on 07/25/2015 05/02/15   Orvan Falconer, MD  saccharomyces boulardii (FLORASTOR) 250 MG capsule Take 1 capsule (250 mg total) by mouth 2 (two) times daily. Patient not taking: Reported on 07/25/2015 03/25/15   Donne Hazel, MD     Allergies:  Allergies  Allergen Reactions  . Dilaudid [Hydromorphone Hcl] Itching  . Actifed Cold-Allergy [Chlorpheniramine-Phenylephrine]     "  I was sick and red and it didn't agree with me at all"  . Doxycycline Nausea And Vomiting  . Other Cough    Pt states she is allergic to ragweed and that she starts coughing and sneezing like crazy  . Penicillins Hives and Itching    Has patient had a PCN reaction causing immediate rash, facial/tongue/throat swelling, SOB or lightheadedness with hypotension: Yes Has patient had a PCN reaction causing severe rash involving mucus membranes or skin necrosis: No Has patient had a PCN reaction that required hospitalization Yes Has patient had a PCN reaction occurring within the last 10 years: No If all of the above answers are "NO", then may proceed with Cephalosporin use.   . Reglan [Metoclopramide] Itching  . Valium [Diazepam] Itching  . Vistaril [Hydroxyzine Hcl] Itching    Social History:   reports that she has been smoking Cigarettes.  She has been smoking about 0.25 packs per day. She has never used smokeless tobacco. She reports  that she does not drink alcohol or use illicit drugs.  Family History: Family History  Problem Relation Age of Onset  . Stroke Mother   . Heart attack Father   . Diabetes Brother   . Cerebral palsy Brother   . Pneumonia Brother   . Diabetes Other   . Heart attack Other      Physical Exam: Filed Vitals:   07/26/15 1400 07/26/15 1458 07/26/15 1500 07/26/15 1621  BP:   114/71   Pulse: 80  72   Temp:    98.2 F (36.8 C)  TempSrc:    Axillary  Resp: 24  17   Height:      Weight:      SpO2: 96% 97% 98%    Blood pressure 114/71, pulse 72, temperature 98.2 F (36.8 C), temperature source Axillary, resp. rate 17, height '5\' 6"'$  (1.676 m), weight 120.6 kg (265 lb 14 oz), SpO2 98 %.  GEN:  Pleasant  patient lying in the stretcher in no acute distress; cooperative with exam. PSYCH:  alert and oriented x4; does not appear anxious or depressed; affect is appropriate. HEENT: Mucous membranes pink and anicteric; PERRLA; EOM intact; no cervical lymphadenopathy nor thyromegaly or carotid bruit; no JVD; There were no stridor. Neck is very supple. Breasts:: Not examined CHEST WALL: No tenderness CHEST: Normal respiration, clear to auscultation bilaterally.  HEART: Regular rate and rhythm.  There are no murmur, rub, or gallops.   BACK: No kyphosis or scoliosis; no CVA tenderness ABDOMEN: soft and non-tender; no masses, no organomegaly, normal abdominal bowel sounds; no pannus; no intertriginous candida. There is no rebound and no distention. Rectal Exam: Not done EXTREMITIES: No bone or joint deformity; age-appropriate arthropathy of the hands and knees; no edema; no ulcerations.  There is no calf tenderness. Genitalia: not examined PULSES: 2+ and symmetric SKIN: Normal hydration no rash or ulceration CNS: Cranial nerves 2-12 grossly intact no focal lateralizing neurologic deficit.  Speech is fluent; uvula elevated with phonation, facial symmetry and tongue midline. DTR are normal bilaterally,  cerebella exam is intact, barbinski is negative and strengths are equaled bilaterally.  No sensory loss.   Labs on Admission:  Basic Metabolic Panel:  Recent Labs Lab 07/25/15 0640 07/26/15 0436  NA 139 138  K 4.5 4.5  CL 103 101  CO2 29 28  GLUCOSE 134* 307*  BUN 26* 35*  CREATININE 1.57* 1.53*  CALCIUM 8.4* 8.0*   Liver Function Tests:  Recent Labs Lab 07/25/15 0640  AST 17  ALT 10*  ALKPHOS 90  BILITOT 0.5  PROT 6.9  ALBUMIN 3.6   CBC:  Recent Labs Lab 07/25/15 0640 07/26/15 0436  WBC 7.6 4.6  NEUTROABS 6.0  --   HGB 11.6* 10.7*  HCT 38.7 35.4*  MCV 85.8 86.8  PLT 260 232   CBG:  Recent Labs Lab 07/25/15 2155 07/26/15 0611 07/26/15 0748 07/26/15 1126 07/26/15 1620  GLUCAP 306* 362* 347* 367* 267*     Radiological Exams on Admission: Dg Chest Port 1 View  07/25/2015  CLINICAL DATA:  65 year old female with a history of fever and cough for 3 days. EXAM: PORTABLE CHEST 1 VIEW COMPARISON:  04/30/2015 FINDINGS: Significant right rotation and positioning of the patient limits evaluation. Ill-defined interstitial and airspace opacities at the bilateral lung bases. No definite pneumothorax.  No large pleural effusion. Cardiomediastinal silhouette likely unchanged, though evaluation is limited. IMPRESSION: Limited chest x-ray demonstrates ill-defined basilar airspace and interstitial opacities, potentially atelectasis, edema, and/ or developing infection. If there is ongoing concern for more specific evaluation, a formal PA and lateral chest x-ray with improved position may be useful. Signed, Dulcy Fanny. Earleen Newport, DO [facility] Electronically Signed   By: Corrie Mckusick D.O.   On: 07/25/2015 07:02    EKG: Independently reviewed.   Assessment/Plan Present on Admission:  . COPD (chronic obstructive pulmonary disease) ([place]) . CKD (chronic kidney disease), stage III . Chronic diastolic CHF (congestive heart  failure) ([place]) . HTN (hypertension), benign . Morbid obesity ([place]) . Peripheral edema  PLAN:  INFLUENZA :  She was discharged, but became quite upset and said that her daughter cannot take off work and she worries that her daughter would get fired.  She now would like to have an enema for her constipation.  Discharge was held, and we ll transfer her to the floor.  She feels that she is ready to go home tomorrow.   HTN:  Stable. Continue with meds.  COPD exacerbation:  She was given IV Steroids, will transition to oral Prednisone.   Obesity.  CHRONIC DIASTOLIC CHF:  Stable.   Other plans as per orders. Code Status: FULL Haskel Khan, MD.  FACP [facility] Pager [phone] 7pm to 7am.  07/26/2015, 5:03 PM

## 2015-07-26 NOTE — Care Management Note (Signed)
Case Management Note  Patient Details  Name: Stephanie Combs MRN: [id number] Date of Birth: February 16, 1951  Expected Discharge Date:  07/28/15               Expected Discharge Plan:  [place]  In-House Referral:  NA  Discharge planning Services  CM Consult  Post Acute Care Choice:  Durable Medical Equipment, Home Health Choice offered to:  Patient  DME Arranged:  Walker rolling DME Agency:  [facility]  HH Arranged:  RN [place] Agency:  [facility]  Status of Service:  Completed, signed off  Medicare Important Message Given:    Date Medicare IM Given:    Medicare IM give by:    Date Additional Medicare IM Given:    Additional Medicare Important Message give by:     If discussed at [place] of Stay Meetings, dates discussed:    Additional Comments: Pt re-seen by MD and will be discharged home today with [facility] nursing for f/u. Pt uses [facility] for [facility] services. PT understands HH has 48 hours to make first visit. Romualdo Bolk, of [facility], made aware of referral and will obtain pt info from chart. Pt concerned about transportation home. Pt told she had Medicaid and [facility] will provide transportation home for $3. Pt prefers her daughter takes her home and states her daughter does not get off work until 6pm, Pt informed that she could be discharged later this evening once her daughter gets off work. No further CM needs.  Sherald Barge, RN 07/26/2015, 3:13 PM

## 2015-07-26 NOTE — Progress Notes (Signed)
Inpatient Diabetes Program Recommendations  AACE/ADA: New Consensus Statement on Inpatient Glycemic Control (2015)  Target Ranges:  Prepandial:   less than 140 mg/dL      Peak postprandial:   less than 180 mg/dL (1-2 hours)      Critically ill patients:  140 - 180 mg/dL  Results for Stephanie Combs, Stephanie Combs (MRN [id number]) as of 07/26/2015 11:06  Ref. Range 07/25/2015 06:34 07/25/2015 11:47 07/25/2015 16:44 07/25/2015 21:55 07/26/2015 06:11 07/26/2015 07:48  Glucose-Capillary Latest Ref Range: 65-99 mg/dL 123 (H) 206 (H) 199 (H) 306 (H) 362 (H) 347 (H)   Review of Glycemic Control  Diabetes history: DM2 Outpatient Diabetes medications: Levemir 40 units BID, Novolog 20 units QAM, Novolog 20 units QPM, Jardiance 25 mg daily, Metformin 500 mg BID Current orders for Inpatient glycemic control: Levemir 20 units QHS, Novolog 0-20 units TID with meals, Novolog 0-5 units QHS  Inpatient Diabetes Program Recommendations:  Insulin - Basal: Fasting glucose was 347 mg/dl this morning after patient received Levemir 20 units last night. Please consider increasing Levemir to 20 units BID starting now (which is half of outpatient dose). Insulin - Meal Coverage: If steroids are continued as ordered, please consider ordering Novolog 5 units TID with meals for meal coverage.  Thanks, Barnie Alderman, RN, MSN, CDE Diabetes Coordinator Inpatient Diabetes Program [phone] (Team Pager from [place] to [place]) [phone] ([facility] office) [phone] [facility] office) [phone] [facility] office)

## 2015-07-26 NOTE — Care Management Important Message (Signed)
Important Message  Patient Details  Name: Stephanie Combs MRN: [id number] Date of Birth: [birth year]   Medicare Important Message Given:  N/A - LOS <3 / Initial given by admissions    Sherald Barge, RN 07/26/2015, 3:16 PM

## 2015-07-27 DIAGNOSIS — I10 Essential (primary) hypertension: Secondary | ICD-10-CM

## 2015-07-27 LAB — BASIC METABOLIC PANEL
Anion gap: 6 (ref 5–15)
BUN: 47 mg/dL — ABNORMAL HIGH (ref 6–20)
CO2: 30 mmol/L (ref 22–32)
Calcium: 7.8 mg/dL — ABNORMAL LOW (ref 8.9–10.3)
Chloride: 96 mmol/L — ABNORMAL LOW (ref 101–111)
Creatinine, Ser: 1.78 mg/dL — ABNORMAL HIGH (ref 0.44–1.00)
GFR calc Af Amer: 33 mL/min — ABNORMAL LOW (ref >60)
GFR calc non Af Amer: 29 mL/min — ABNORMAL LOW (ref >60)
Glucose, Bld: 301 mg/dL — ABNORMAL HIGH (ref 65–99)
Potassium: 4.5 mmol/L (ref 3.5–5.1)
Sodium: 132 mmol/L — ABNORMAL LOW (ref 135–145)

## 2015-07-27 LAB — GLUCOSE, CAPILLARY
Glucose-Capillary: 246 mg/dL — ABNORMAL HIGH (ref 65–99)
Glucose-Capillary: 288 mg/dL — ABNORMAL HIGH (ref 65–99)

## 2015-07-27 MED ORDER — OSELTAMIVIR PHOSPHATE 30 MG PO CAPS
30.0000 mg | ORAL_CAPSULE | Freq: Two times a day (BID) | ORAL | Status: DC
  Filled 2015-07-27 (×6): qty 1

## 2015-07-27 MED ORDER — FUROSEMIDE 40 MG PO TABS: 40.0000 mg | ORAL_TABLET | Freq: Every day | ORAL | Status: DC

## 2015-07-27 MED ORDER — PREDNISONE 20 MG PO TABS: 40.0000 mg | ORAL_TABLET | Freq: Every day | ORAL | Status: DC

## 2015-07-27 MED ORDER — OSELTAMIVIR PHOSPHATE 30 MG PO CAPS: 30.0000 mg | ORAL_CAPSULE | Freq: Two times a day (BID) | ORAL | Status: DC

## 2015-07-27 NOTE — Progress Notes (Signed)
Inpatient Diabetes Program Recommendations  AACE/ADA: New Consensus Statement on Inpatient Glycemic Control (2015)  Target Ranges:  Prepandial:   less than 140 mg/dL      Peak postprandial:   less than 180 mg/dL (1-2 hours)      Critically ill patients:  140 - 180 mg/dL  Results for Stephanie Combs, Stephanie Combs (MRN [id number]) as of 07/27/2015 15:41  Ref. Range 07/25/2015 06:40 07/25/2015 09:26 07/26/2015 04:36 07/27/2015 04:53  Glucose Latest Ref Range: 65-99 mg/dL 134 (H)  307 (H) 301 (H)  07/27/2015 Noted decrease in steroids on 07/26/15.  Please consider increasing Lantus to 20 units bid.  [place], CDE. M.Ed. Pager [phone] Inpatient Diabetes Coordinator

## 2015-07-27 NOTE — Discharge Summary (Signed)
Physician Discharge Summary  Stephanie Combs IHK:[id number] DOB: Feb 10, 1951 DOA: 07/25/2015  PCP: Neale Burly, MD  Admit date: 07/25/2015 Discharge date: 07/27/2015  Time spent: 35 minutes  Recommendations for Outpatient Follow-up:  1. Will follow up with PCP in one week.    Discharge Diagnoses:  Principal Problem:   CAP (community acquired pneumonia) Active Problems:   COPD (chronic obstructive pulmonary disease) ([place])   Morbid obesity ([place])   HTN (hypertension), benign   CKD (chronic kidney disease), stage III   Chronic diastolic CHF (congestive heart failure) ([place])   Peripheral edema   Diabetes mellitus with hyperglycemia, with long-term current use of insulin ([place])   Discharge Condition: improved. No fever, chills, and breathing well with no DOE.   Diet recommendation: Cardiac with carb modified diet.   Filed Weights   07/25/15 1012 07/26/15 0500  Weight: 122.3 kg (269 lb 10 oz) 120.6 kg (265 lb 14 oz)    History of present illness:  Patient was admitted for SOB, coughs, and chills by me on July 25, 2015.  As per my prior H and P:  " Stephanie Combs is an 65 y.o. female with hx of COPD on home oxygen, DM with labile BS, hypothyrodism, CHF, anxiety, whom I discharged in Dec after COPD exacerbation, presented to the ER with chills, SOB, subjective fever, and coughs. She has some myalgia as well. She did not have CP, abdominal pain, nausea, vomiting, or diarrhea. She has no ill contact or distant travel. Evalaution in the ER showed CXR with possible infiltrate, WBC of 6K, normal Hb and Cr of 1,57. She was given neb, IV levoquin, and hospitalist was asked to admit her for COPD exacerbation, and possible CAP.   Hospital Course: Patient was given Tamiflu for suspect influenza infection, along with antibiotics and steroids for her wheezing.  Her Influenza test came back positive, and her breathing has improved.  She was subsequently discharged to home, but she did not wanted to  have her daughter help her as she didn't want her daughter to miss work.  She was therefore kept over for another night in the hospital.  Her IV was discontinued, and her meds were placed on oral meds.  She is now ready to go home.  Home health was set up for her as well.  She will need to be on Prednisone 40 mg for another 5 days, and she will finish her 5 day course total of Tamiflu.  She will follow up with her PCP next week.  Thank you and Good Day.    Discharge Exam: Filed Vitals:   07/26/15 1943 07/27/15 0646  BP: 147/44 128/64  Pulse: 72 71  Temp: 97.8 F (36.6 C) 99 F (37.2 C)  Resp: 16 18    Discharge Instructions   Discharge Instructions    Diet - low sodium heart healthy    Complete by:  As directed      Discharge instructions    Complete by:  As directed   Follow up with your PCP in one week.     Increase activity slowly    Complete by:  As directed           Current Discharge Medication List    START taking these medications   Details  !! furosemide (LASIX) 40 MG tablet Take 1 tablet (40 mg total) by mouth daily. Qty: 30 tablet, Refills: 0    oseltamivir (TAMIFLU) 75 MG capsule Take 1 capsule (75 mg total) by  mouth 2 (two) times daily. Qty: 10 capsule, Refills: 0    !! predniSONE (DELTASONE) 20 MG tablet Take 2 tablets (40 mg total) by mouth daily before breakfast. Qty: 5 tablet, Refills: 0     !! - Potential duplicate medications found. Please discuss with provider.    CONTINUE these medications which have CHANGED   Details  ALPRAZolam (XANAX) 1 MG tablet Take 1 tablet (1 mg total) by mouth 3 (three) times daily as needed for anxiety. Qty: 30 tablet, Refills: 0    insulin detemir (LEVEMIR) 100 UNIT/ML injection Inject 0.2 mLs (20 Units total) into the skin at bedtime. Qty: 10 mL, Refills: 11      CONTINUE these medications which have NOT CHANGED   Details  albuterol (PROAIR HFA) 108 (90 BASE) MCG/ACT inhaler Inhale 1 puff into the lungs every 4  (four) hours as needed. For shortness of breath Qty: 1 Inhaler, Refills: 1    aspirin EC 81 MG tablet Take 81 mg by mouth daily.    diltiazem (CARDIZEM CD) 240 MG 24 hr capsule Take 1 capsule (240 mg total) by mouth daily. Qty: 30 capsule, Refills: 2    empagliflozin (JARDIANCE) 25 MG TABS tablet Take 25 mg by mouth daily.    !! furosemide (LASIX) 20 MG tablet Take 2 tablets (40 mg total) by mouth 2 (two) times daily. Qty: 180 tablet, Refills: 1    gabapentin (NEURONTIN) 400 MG capsule Take 3 capsules (1,200 mg total) by mouth 2 (two) times daily. Qty: 60 capsule, Refills: 1    !! insulin aspart (NOVOLOG) 100 UNIT/ML injection Inject 0-20 Units into the skin 3 (three) times daily with meals. Takes 20 Units in am and 20 Units at night.  Per sliding scale    levothyroxine (SYNTHROID, LEVOTHROID) 125 MCG tablet Take 2 tablets (250 mcg total) by mouth daily before breakfast. Qty: 30 tablet, Refills: 2    menthol-cetylpyridinium (CEPACOL) 3 MG lozenge Take 1 lozenge (3 mg total) by mouth as needed for sore throat. Qty: 100 tablet, Refills: 0    metFORMIN (GLUCOPHAGE) 500 MG tablet Take 500 mg by mouth 2 (two) times daily with a meal.    nicotine (NICODERM CQ - DOSED IN MG/24 HOURS) 21 mg/24hr patch Place 1 patch (21 mg total) onto the skin daily. Qty: 28 patch, Refills: 0    nystatin (MYCOSTATIN/NYSTOP) 100000 UNIT/GM POWD Apply 1 g topically 2 (two) times daily.    Omega-3 Fatty Acids (FISH OIL PO) Take 1 capsule by mouth 2 (two) times daily.     ondansetron (ZOFRAN ODT) 8 MG disintegrating tablet Take 1 tablet (8 mg total) by mouth every 8 (eight) hours as needed for nausea or vomiting. Qty: 10 tablet, Refills: 0    oxyCODONE-acetaminophen (PERCOCET/ROXICET) 5-325 MG tablet Take 2 tablets by mouth every 6 (six) hours as needed for moderate pain. Qty: 10 tablet, Refills: 0    potassium chloride (K-DUR) 10 MEQ tablet Take 20 mEq by mouth 2 (two) times daily.      HYDROcodone-homatropine (HYCODAN) 5-1.5 MG/5ML syrup Take 5 mLs by mouth every 6 (six) hours as needed for cough. Qty: 120 mL, Refills: 0    !! insulin aspart (NOVOLOG) 100 UNIT/ML injection Inject 12 Units into the skin 3 (three) times daily with meals. Qty: 10 mL, Refills: 11    pantoprazole (PROTONIX) 40 MG tablet Take 1 tablet (40 mg total) by mouth 2 (two) times daily. Qty: 60 tablet, Refills: 0    !! predniSONE (DELTASONE) 20  MG tablet Take 1.5 tablets (30 mg total) by mouth daily with breakfast. Qty: 5 tablet, Refills: 0    saccharomyces boulardii (FLORASTOR) 250 MG capsule Take 1 capsule (250 mg total) by mouth 2 (two) times daily. Qty: 30 capsule, Refills: 0     !! - Potential duplicate medications found. Please discuss with provider.    STOP taking these medications     tiZANidine (ZANAFLEX) 4 MG tablet      levofloxacin (LEVAQUIN) 500 MG tablet      nicotine (NICODERM CQ - DOSED IN MG/24 HR) 7 mg/24hr patch        Allergies  Allergen Reactions  . Dilaudid [Hydromorphone Hcl] Itching  . Actifed Cold-Allergy [Chlorpheniramine-Phenylephrine]     "I was sick and red and it didn't agree with me at all"  . Doxycycline Nausea And Vomiting  . Other Cough    Pt states she is allergic to ragweed and that she starts coughing and sneezing like crazy  . Penicillins Hives and Itching    Has patient had a PCN reaction causing immediate rash, facial/tongue/throat swelling, SOB or lightheadedness with hypotension: Yes Has patient had a PCN reaction causing severe rash involving mucus membranes or skin necrosis: No Has patient had a PCN reaction that required hospitalization Yes Has patient had a PCN reaction occurring within the last 10 years: No If all of the above answers are "NO", then may proceed with Cephalosporin use.   . Reglan [Metoclopramide] Itching  . Valium [Diazepam] Itching  . Vistaril [Hydroxyzine Hcl] Itching   Follow-up Information    Follow up with  [facility].   Contact information:   [address] [place] [address] [phone]        The results of significant diagnostics from this hospitalization (including imaging, microbiology, ancillary and laboratory) are listed below for reference.    Significant Diagnostic Studies: Dg Chest Port 1 View  07/25/2015  CLINICAL DATA:  65 year old female with a history of fever and cough for 3 days. EXAM: PORTABLE CHEST 1 VIEW COMPARISON:  04/30/2015 FINDINGS: Significant right rotation and positioning of the patient limits evaluation. Ill-defined interstitial and airspace opacities at the bilateral lung bases. No definite pneumothorax.  No large pleural effusion. Cardiomediastinal silhouette likely unchanged, though evaluation is limited. IMPRESSION: Limited chest x-ray demonstrates ill-defined basilar airspace and interstitial opacities, potentially atelectasis, edema, and/ or developing infection. If there is ongoing concern for more specific evaluation, a formal PA and lateral chest x-ray with improved position may be useful. Signed, Dulcy Fanny. Earleen Newport, DO [facility] Electronically Signed   By: Corrie Mckusick D.O.   On: 07/25/2015 07:02    Microbiology: Recent Results (from the past 240 hour(s))  Culture, blood (routine x 2)     Status: None (Preliminary result)   Collection Time: 07/25/15  6:40 AM  Result Value Ref Range Status   Specimen Description BLOOD RIGHT ANTECUBITAL  Final   Special Requests BOTTLES DRAWN AEROBIC AND ANAEROBIC 10CC  Final   Culture NO GROWTH 1 DAY  Final   Report Status PENDING  Incomplete  Culture, blood (routine x 2)     Status: None (Preliminary result)   Collection Time: 07/25/15  6:52 AM  Result Value Ref Range Status   Specimen Description BLOOD LEFT ANTECUBITAL  Final   Special Requests BOTTLES DRAWN AEROBIC AND ANAEROBIC 10CC  Final   Culture  Setup Time   Final    GRAM  POSITIVE COCCI  IN CLUSTERS RECOVERED FROM BOTH BOTTLES. Gram Stain Report Called to,Read Back By and Verified With: PHILLIPS C AT 0430 ON [id number] BY Mikel Cella K Performed at [facility]    Culture   Final    STAPHYLOCOCCUS SPECIES (COAGULASE NEGATIVE) THE SIGNIFICANCE OF ISOLATING THIS ORGANISM FROM A SINGLE SET OF BLOOD CULTURES WHEN MULTIPLE SETS ARE DRAWN IS UNCERTAIN. PLEASE NOTIFY THE MICROBIOLOGY DEPARTMENT WITHIN ONE WEEK IF SPECIATION AND SENSITIVITIES ARE REQUIRED. Performed at [facility]    Report Status PENDING  Incomplete  Urine culture     Status: None   Collection Time: 07/25/15  9:10 AM  Result Value Ref Range Status   Specimen Description URINE, CLEAN CATCH  Final   Special Requests NONE  Final   Culture   Final    NO GROWTH 1 DAY Performed at [facility]    Report Status 07/26/2015 FINAL  Final  MRSA PCR Screening     Status: None   Collection Time: 07/25/15 10:45 PM  Result Value Ref Range Status   MRSA by PCR NEGATIVE NEGATIVE Final    Comment:        The GeneXpert MRSA Assay (FDA approved for NASAL specimens only), is one component of a comprehensive MRSA colonization surveillance program. It is not intended to diagnose MRSA infection nor to guide or monitor treatment for MRSA infections.      Labs: Basic Metabolic Panel:  Recent Labs Lab 07/25/15 0640 07/26/15 0436 07/27/15 0453  NA 139 138 132*  K 4.5 4.5 4.5  CL 103 101 96*  CO2 '29 28 30  '$ GLUCOSE 134* 307* 301*  BUN 26* 35* 47*  CREATININE 1.57* 1.53* 1.78*  CALCIUM 8.4* 8.0* 7.8*   Liver Function Tests:  Recent Labs Lab 07/25/15 0640  AST 17  ALT 10*  ALKPHOS 90  BILITOT 0.5  PROT 6.9  ALBUMIN 3.6   CBC:  Recent Labs Lab 07/25/15 0640 07/26/15 0436  WBC 7.6 4.6  NEUTROABS 6.0  --   HGB 11.6* 10.7*  HCT 38.7 35.4*  MCV 85.8 86.8  PLT 260 232    BNP (last 3 results)  Recent Labs  12/04/14 0456 03/24/15 1538 04/30/15 0358  BNP 50.0 170.0*  37.0    CBG:  Recent Labs Lab 07/26/15 1126 07/26/15 1620 07/26/15 2006 07/27/15 0720 07/27/15 1114  GLUCAP 367* 267* 224* 246* 288*    SignedOrvan Falconer MD.  [facility] 07/27/2015, 11:23 AM

## 2015-07-27 NOTE — Clinical Documentation Improvement (Signed)
Internal Medicine  Patient is Oxygen Dependent secondary to COPD. Please provide a diagnosis associated with O2 dependence and treatment provided. Please document response in next progress note. Thank you!   Acute Hypoxic or Hypercapneic Respiratory Failure  Chronic Hypoxic or Hypercapneic Respiratory Failure  Respiratory Failure ruled out  Other Condition  Clinically Undetermined  Document any associated diagnoses/conditions.  Supporting Information:  Respiratory rates ranging from 18 to 34 per flow sheets  Heart rate > 90  Placed in 4L FiO2 on admission and gradually weaned to 3L  Having occasional drops in sats to high 80's  Please exercise your independent, professional judgment when responding. A specific answer is not anticipated or expected.  Thank You,  Zoila Shutter RN, BSN, [employer] [phone]; Cell: [phone]

## 2015-07-27 NOTE — Discharge Planning (Signed)
Pt being discharged home with home health, no c/o pain , vitals stable , all paperwork sent with pt, no questions for the nurse

## 2015-07-28 LAB — CULTURE, BLOOD (ROUTINE X 2)

## 2015-07-30 LAB — CULTURE, BLOOD (ROUTINE X 2): Culture: NO GROWTH

## 2015-07-31 ENCOUNTER — Ambulatory Visit (HOSPITAL_COMMUNITY): Payer: Medicare Other | Admitting: Physical Therapy

## 2015-07-31 DIAGNOSIS — I890 Lymphedema, not elsewhere classified: Secondary | ICD-10-CM

## 2015-07-31 DIAGNOSIS — M545 Low back pain: Secondary | ICD-10-CM | POA: Diagnosis not present

## 2015-07-31 DIAGNOSIS — J449 Chronic obstructive pulmonary disease, unspecified: Secondary | ICD-10-CM | POA: Diagnosis not present

## 2015-07-31 DIAGNOSIS — J441 Chronic obstructive pulmonary disease with (acute) exacerbation: Secondary | ICD-10-CM | POA: Diagnosis not present

## 2015-07-31 DIAGNOSIS — R262 Difficulty in walking, not elsewhere classified: Secondary | ICD-10-CM

## 2015-07-31 DIAGNOSIS — M150 Primary generalized (osteo)arthritis: Secondary | ICD-10-CM | POA: Diagnosis not present

## 2015-07-31 DIAGNOSIS — I509 Heart failure, unspecified: Secondary | ICD-10-CM | POA: Diagnosis not present

## 2015-07-31 DIAGNOSIS — T148XXA Other injury of unspecified body region, initial encounter: Secondary | ICD-10-CM

## 2015-07-31 DIAGNOSIS — E875 Hyperkalemia: Secondary | ICD-10-CM | POA: Diagnosis not present

## 2015-07-31 DIAGNOSIS — R0902 Hypoxemia: Secondary | ICD-10-CM | POA: Diagnosis not present

## 2015-07-31 DIAGNOSIS — R0602 Shortness of breath: Secondary | ICD-10-CM | POA: Diagnosis not present

## 2015-07-31 DIAGNOSIS — R29898 Other symptoms and signs involving the musculoskeletal system: Secondary | ICD-10-CM

## 2015-07-31 DIAGNOSIS — E039 Hypothyroidism, unspecified: Secondary | ICD-10-CM | POA: Diagnosis not present

## 2015-07-31 DIAGNOSIS — J9602 Acute respiratory failure with hypercapnia: Secondary | ICD-10-CM | POA: Diagnosis not present

## 2015-07-31 DIAGNOSIS — Z888 Allergy status to other drugs, medicaments and biological substances status: Secondary | ICD-10-CM | POA: Diagnosis not present

## 2015-07-31 DIAGNOSIS — J9601 Acute respiratory failure with hypoxia: Secondary | ICD-10-CM | POA: Diagnosis not present

## 2015-07-31 DIAGNOSIS — I10 Essential (primary) hypertension: Secondary | ICD-10-CM | POA: Diagnosis not present

## 2015-07-31 DIAGNOSIS — G8929 Other chronic pain: Secondary | ICD-10-CM | POA: Diagnosis not present

## 2015-07-31 DIAGNOSIS — Z91040 Latex allergy status: Secondary | ICD-10-CM | POA: Diagnosis not present

## 2015-07-31 DIAGNOSIS — R269 Unspecified abnormalities of gait and mobility: Secondary | ICD-10-CM

## 2015-07-31 NOTE — Therapy (Signed)
[place], [name], [address] Phone: [phone]   Fax:  [phone]  Physical Therapy Treatment  Patient Details  Name: Stephanie Combs MRN: [id number] Date of Birth: 07-10-1950 No Data Recorded  Encounter Date: 07/31/2015    Past Medical History  Diagnosis Date  . Hypertension   . Diabetes mellitus without complication ([place])   . COPD (chronic obstructive pulmonary disease) ([place])   . CHF (congestive heart failure) ([facility])     Diastolic  . Anxiety   . Chronic back pain   . Hypothyroidism   . Pedal edema   . On home O2     2.5 L N/C prn  . Degenerative disk disease     Past Surgical History  Procedure Laterality Date  . Tubal ligation    . Hernia repair      There were no vitals filed for this visit.  Visit Diagnosis:  Lymphedema  Nonhealing nonsurgical wound  Leg heaviness  Difficulty walking  Abnormal gait      Subjective Assessment - 07/31/15 1415    Subjective Pt states that she was admitted into  the hospitalon 3/17 discharged on the 17th.  She recieved her compression stockings in the hospital but has not had them on since discharge and her legs have swelled up to a point that she is unable to don the compression stockings at that time.   Pt states she needs something to eat she does not feel right.  Pt given potatoe chips and water with no imporvement.  O2 taken and was at 84 with HR at 89.  Pt requests to leave to go to the hospital.  Therapist asked pt if she wanted 911 to be called to transport her to the hospital pt states no that she prefers that her ride takes her to [facility] where her MD practices.  No treatment was completed today.                        Wound Therapy    Wound Properties Date First Assessed: 11/07/14 Location: Toe (Comment  which one) , right great toe  Location Orientation: Right Wound Description (Comments): small open hole under the right  grest toe                       PT Long Term Goals - 07/09/15 1308    PT LONG TERM GOAL #1   Title Pt will be independent in donning/doffing compression garments for long term management of lymphedema.   Baseline unable - pt does not have compression garment at this point   Time 6   Period Weeks   Status New               Problem List Patient Active Problem List   Diagnosis Date Noted  . CAP (community acquired pneumonia) 07/25/2015  . Pressure ulcer 05/02/2015  . Hyponatremia 05/01/2015  . Acute-on-chronic kidney injury ([place]) 05/01/2015  . Diabetes mellitus with hyperglycemia, with long-term current use of insulin ([place]) 05/01/2015  . COPD exacerbation ([place]) 04/30/2015  . Cellulitis of foot, right 03/24/2015  . Elevated d-dimer 03/24/2015  . Peripheral edema 12/04/2014  . Bilateral edema of lower extremity   . Diabetic ulcer of right great toe ([place])   . Foot ulcer due to secondary DM ([place]) 11/07/2014  . Diabetic ulcer of right foot ([place]) 11/07/2014  . Edema of lower extremity 05/27/2013  .  Headache 05/26/2013  . CKD (chronic kidney disease), stage III 04/14/2013  . Leukocytosis 04/14/2013  . Anemia in CKD (chronic kidney disease) 04/14/2013  . Dyspnea 04/14/2013  . Chronic diastolic CHF (congestive heart failure) ([place])   . COPD (chronic obstructive pulmonary disease) ([place]) 08/07/2012  . Morbid obesity ([place]) 08/07/2012  . DM type 2 (diabetes mellitus, type 2) ([place]) 08/07/2012  . HTN (hypertension), benign 08/07/2012  . Hypothyroidism (acquired) 08/07/2012  Rayetta Humphrey, [place] CLT [phone] 07/31/2015, 2:19 PM  [facility] [address] [place], [name], [address] Phone: [phone]   Fax:  [phone]  Name: JULIE NAY MRN: [id number] Date of Birth: March 27, 1951

## 2015-08-02 ENCOUNTER — Ambulatory Visit (HOSPITAL_COMMUNITY): Payer: Medicare Other | Admitting: Physical Therapy

## 2015-08-06 ENCOUNTER — Ambulatory Visit (HOSPITAL_COMMUNITY): Payer: Medicare Other | Admitting: Physical Therapy

## 2015-08-06 DIAGNOSIS — R262 Difficulty in walking, not elsewhere classified: Secondary | ICD-10-CM

## 2015-08-06 DIAGNOSIS — R29898 Other symptoms and signs involving the musculoskeletal system: Secondary | ICD-10-CM

## 2015-08-06 DIAGNOSIS — R269 Unspecified abnormalities of gait and mobility: Secondary | ICD-10-CM | POA: Diagnosis present

## 2015-08-06 DIAGNOSIS — T148 Other injury of unspecified body region: Secondary | ICD-10-CM | POA: Diagnosis present

## 2015-08-06 DIAGNOSIS — I890 Lymphedema, not elsewhere classified: Secondary | ICD-10-CM

## 2015-08-06 DIAGNOSIS — T148XXA Other injury of unspecified body region, initial encounter: Secondary | ICD-10-CM

## 2015-08-06 NOTE — Therapy (Signed)
[place], [name], [address] Phone: [phone]   Fax:  [phone] [place] Name: Stephanie Combs MRN: [id number] Date of Birth: 29-May-1950     Physical Therapy Treatment  Patient Details  Name: Stephanie Combs MRN: [id number] Date of Birth: Mar 16, 1951 Referring Provider: Stoney Bang  Encounter Date: 08/06/2015      PT End of Session - 08/06/15 1609    Visit Number 1   Number of Visits 16   Date for PT Re-Evaluation 09/05/15   Authorization Type UHC medicare (gcode completed on visit #20)   Authorization - Visit Number 1   Authorization - Number of Visits 10   PT Start Time 1300   PT Stop Time 1415   PT Time Calculation (min) 75 min   Activity Tolerance Patient tolerated treatment well   Behavior During Therapy [facility] for tasks assessed/performed      Past Medical History  Diagnosis Date  . Hypertension   . Diabetes mellitus without complication ([place])   . COPD (chronic obstructive pulmonary disease) ([place])   . CHF (congestive heart failure) ([facility])     Diastolic  . Anxiety   . Chronic back pain   . Hypothyroidism   . Pedal edema   . On home O2     2.5 L N/C prn  . Degenerative disk disease     Past Surgical History  Procedure Laterality Date  . Tubal ligation    . Hernia repair      There were no vitals filed for this visit.  Visit Diagnosis:  Lymphedema  Nonhealing nonsurgical wound  Leg heaviness  Difficulty walking  Abnormal gait      Subjective Assessment - 08/06/15 1417    Subjective [name] was admitted into [facility] on 3/15 for acute COPD.  She was discharged on 07/27/2015.  She was then admitted into [facility] on 07/31/2015 with congestive heart failure and discharged on 08/03/2015. She comes back to outpatient therapy seeking treatment of her lymphedma which has exacerbated due to the patient not having any compression on her LE since 3/17.  She states that she did  recieve her compression garment but by the time she recieved them her legs had swollen so much that they no longer fit her.      How long can you sit comfortably? no problem   How long can you stand comfortably? less than 2 mintues    How long can you walk comfortably? less than 2 minutes    Currently in Pain? Yes   Pain Score 8    Pain Location Leg   Pain Orientation Right;Left   Pain Descriptors / Indicators Aching;Tightness;Throbbing;Heaviness   Pain Onset 1 to 4 weeks ago   Pain Frequency Constant   Aggravating Factors  activity    Pain Relieving Factors not sure             [facility] PT Assessment - 08/06/15 0001    Assessment   Medical Diagnosis B lymphedema    Referring Provider Stoney Bang   Onset Date/Surgical Date 07/27/15   Hand Dominance Right   Prior Therapy for wound care and lymphedema   Restrictions   Weight Bearing Restrictions No   Balance Screen   Has the patient fallen in the past 6 months No   Has the patient had a decrease in activity level because of a fear of falling?  Yes   Is the patient reluctant to leave their home  because of a fear of falling?  Yes   [place] residence   Prior Function   Level of Independence Independent with basic ADLs;Independent with household mobility with device   Vocation Unemployed   Leisure goes to [place]   Overall Cognitive Status Within Functional Limits for tasks assessed   Observation/Other Assessments   Other Surveys  --  Lymphedema life imact survey =85/100    Transfers   Comments difficult to come sit to stand    Ambulation/Gait   Ambulation/Gait Yes   Ambulation/Gait Assistance 5: Supervision   Ambulation Distance (Feet) 80 Feet   Assistive device Rolling walker   Gait Pattern Step-to pattern   Gait Comments fatigues and out of breath.  Did not use O2          05/16/2015 05/16/2015 06/06/2015 06/06/2015 06/15/2015 06/15/2015 08/06/2015 08/06/2015  right  left right left right  Left right Left   30.00 29.20 26.8 26.5 27.5 26.7 29.7 48.3  32 31.5 32.00 31.00 28.80 28.00 31.50 31.30  41.20 34.50 28.40 28.70 29.00 28.70 38.00 35.20  47.00 42.00 34.70 33.20 24.50 31.70 45.00 41.70  53.00 54.30 40.50 39.40 39.00 38.00 49.70 48.00  55.50 55.20 45.00 43.30 43.50 41.20 51.80 52.10  56.50 54.00 48.00 46.00 46.20 45.30 53.00 53.70  54.20 47.00 48.50 47.00 49.00 45.80 52.90 51.90                                                                                                     17849.58 15919.67 12074.39 11341.43 10975.63 10616.64 16104.08 16886.22  5681.7 4627.0350 3843.3991 0938.1829 9371.6967 3379.383 8938.1017 [id number]    Rt LE volume increased by 1633 cc; Lt increased by 1995 cc.             [facility] Adult PT Treatment/Exercise - 08/06/15 0001    Manual Therapy   Manual Therapy Manual Lymphatic Drainage (MLD)   Manual therapy comments manual done to bilateral LE    Manual Lymphatic Drainage (MLD) Supraclavicular, deep and superfical abdominal, Routing fluid using inguinal/axillary anastomsis followed by toe wrapping and profore compression dressing                 PT Education - 08/06/15 1430    Education provided Yes   Education Details take the dressings off if they are painful   Person(s) Educated Patient   Methods Explanation   Comprehension Verbalized understanding          PT Short Term Goals - 08/06/15 1609    PT SHORT TERM GOAL #1   Title Pt to be able to verbalize the signs and symptoms of cellulitis and to be able to verbalize that she understands that people diagnosed with lymphedema hae an increase risk of cellulitis   Time 2   Period Weeks   Status Achieved   PT SHORT TERM GOAL #2   Title Pt pain to be no greater than a 4/10 throughout the day to allow pt to amblulate for at least 10 minutes at a time   Time  4   Period Weeks   PT SHORT TERM GOAL #3   Title Pt to volume to decrease by 20%  for improved fitting of clothes    Time 4   Period Weeks   Status New   PT SHORT TERM GOAL #4   Title Pt feeling of heaviness to decrease so that pt is able to easily lift her legs up onto the bed or couch    Time 4   Period Weeks   Status New           PT Long Term Goals - 23-Aug-2015 1614    PT LONG TERM GOAL #1   Title PT volume to decrease by 35% to allow pt to fit into her compression stockings.    Time 8   Period Weeks   Status New   PT LONG TERM GOAL #2   Title Pt pain to be no greater than a 2/10 to allow pt feel confident going to and participating in [place] dances    Time 8   Period Weeks   PT LONG TERM GOAL #3   Title Pt will be independent in donning/doffing compression garments for long term management of lymphedema.   Time 8   Period Weeks   Status New               Plan - 08-23-2015 1431    Clinical Impression Statement Ms. Norwwod has not been seen in this clinic since 07/23/2015.  She has been admitted into two different hospitals with two different diagnosis,(COPD and CHF), during this time she did not have any compression on her LE's and currently she has edema almost equal to the first time she was seen in this office;(Rt increased by 1633 cc; Lt increased by 1995 cc). The wound that was on the plantar aspect of her left foot has calloused over.  She will need continued skilled therapy to reduce her edema so that she can fit into her compression stockings.  Pt is at high risk for cellulitis with her legs as edematous as they are at this time as pt has been hospitalized with cellulitis several times in the past.    Pt will benefit from skilled therapeutic intervention in order to improve on the following deficits Pain;Difficulty walking;Decreased activity tolerance;Decreased balance;Increased edema   Rehab Potential Good   PT Frequency 2x / week   PT Duration 8 weeks   PT Treatment/Interventions Manual lymph drainage;Compression bandaging;Patient/family  education   PT Next Visit Plan continue with manual techniques as well as multilayer compression bandages.           G-Codes - 08/23/2015 1437    Functional Assessment Tool Used lympedema life impact scale    Functional Limitation Other PT primary   Other PT Primary Current Status (W1191) At least 80 percent but less than 100 percent impaired, limited or restricted   Other PT Primary Goal Status (Y7829) At least 1 percent but less than 20 percent impaired, limited or restricted      Problem List Patient Active Problem List   Diagnosis Date Noted  . CAP (community acquired pneumonia) 07/25/2015  . Pressure ulcer 05/02/2015  . Hyponatremia 05/01/2015  . Acute-on-chronic kidney injury ([place]) 05/01/2015  . Diabetes mellitus with hyperglycemia, with long-term current use of insulin ([place]) 05/01/2015  . COPD exacerbation ([place]) 04/30/2015  . Cellulitis of foot, right 03/24/2015  . Elevated d-dimer 03/24/2015  . Peripheral edema 12/04/2014  . Bilateral edema of lower extremity   .  Diabetic ulcer of right great toe ([place])   . Foot ulcer due to secondary DM ([place]) 11/07/2014  . Diabetic ulcer of right foot ([place]) 11/07/2014  . Edema of lower extremity 05/27/2013  . Headache 05/26/2013  . CKD (chronic kidney disease), stage III 04/14/2013  . Leukocytosis 04/14/2013  . Anemia in CKD (chronic kidney disease) 04/14/2013  . Dyspnea 04/14/2013  . Chronic diastolic CHF (congestive heart failure) ([place])   . COPD (chronic obstructive pulmonary disease) ([place]) 08/07/2012  . Morbid obesity ([place]) 08/07/2012  . DM type 2 (diabetes mellitus, type 2) ([place]) 08/07/2012  . HTN (hypertension), benign 08/07/2012  . Hypothyroidism (acquired) 08/07/2012   Rayetta Humphrey, PT CLT [phone] 08/06/2015, 4:16 PM

## 2015-08-08 ENCOUNTER — Ambulatory Visit (HOSPITAL_COMMUNITY): Payer: Medicare Other | Admitting: Physical Therapy

## 2015-08-08 DIAGNOSIS — I890 Lymphedema, not elsewhere classified: Secondary | ICD-10-CM

## 2015-08-08 DIAGNOSIS — T148XXA Other injury of unspecified body region, initial encounter: Secondary | ICD-10-CM

## 2015-08-08 DIAGNOSIS — T148 Other injury of unspecified body region: Secondary | ICD-10-CM | POA: Diagnosis not present

## 2015-08-08 DIAGNOSIS — R262 Difficulty in walking, not elsewhere classified: Secondary | ICD-10-CM | POA: Diagnosis not present

## 2015-08-08 DIAGNOSIS — R269 Unspecified abnormalities of gait and mobility: Secondary | ICD-10-CM

## 2015-08-08 DIAGNOSIS — R29898 Other symptoms and signs involving the musculoskeletal system: Secondary | ICD-10-CM | POA: Diagnosis not present

## 2015-08-08 NOTE — Therapy (Signed)
[place], [name], [address] Phone: [phone]   Fax:  [phone]  Physical Therapy Treatment  Patient Details  Name: Stephanie Combs MRN: [id number] Date of Birth: 09-01-1950 Referring Provider: Stoney Bang  Encounter Date: 08/08/2015      PT End of Session - 08/08/15 1200    Visit Number 2   Number of Visits 16   Date for PT Re-Evaluation 09/05/15   Authorization Type UHC medicare    Authorization - Visit Number 2   Authorization - Number of Visits 10   PT Start Time 1025   PT Stop Time 1135   PT Time Calculation (min) 70 min   Activity Tolerance Patient tolerated treatment well   Behavior During Therapy [facility] for tasks assessed/performed      Past Medical History  Diagnosis Date  . Hypertension   . Diabetes mellitus without complication ([place])   . COPD (chronic obstructive pulmonary disease) ([place])   . CHF (congestive heart failure) ([facility])     Diastolic  . Anxiety   . Chronic back pain   . Hypothyroidism   . Pedal edema   . On home O2     2.5 L N/C prn  . Degenerative disk disease     Past Surgical History  Procedure Laterality Date  . Tubal ligation    . Hernia repair      There were no vitals filed for this visit.  Visit Diagnosis:  Lymphedema  Nonhealing nonsurgical wound  Leg heaviness  Difficulty walking  Abnormal gait      Subjective Assessment - 08/08/15 1158    Subjective Pt states she can tell her LE's have gone down.  Reports she is diong well with smoking cessation and continues to use her oxygen.   Pertinent History Stephanie Combs was admitted into [facility] on 3/15 for acute COPD.  She was discharged on 07/27/2015.  She was then admitted into [facility] on 07/31/2015 with congestive heart failure and discharged on 08/03/2015. She comes back to outpatient therapy seeking treatment of her lymphedma which has exacerbated due to the patient not having any compression on her LE  since 3/17.  She states that she did recieve her compression garment but by the time she recieved them her legs had swollen so much that they no longer fit her.      Currently in Pain? No/denies                       Wound Therapy    Wound Properties Date First Assessed: 11/07/14 Location: Toe (Comment  which one) , right great toe  Location Orientation: Right Wound Description (Comments): small open hole under the right grest toe          [facility] Adult PT Treatment/Exercise - 08/08/15 0001    Manual Therapy   Manual Therapy Manual Lymphatic Drainage (MLD)   Manual therapy comments manual done prior to bandaging to bilateral LE    Manual Lymphatic Drainage (MLD) Supraclavicular, deep and superfical abdominal, Routing fluid using inguinal/axillary anastomsis followed by toe wrapping and profore compression dressing                   PT Short Term Goals - 08/06/15 1609    PT SHORT TERM GOAL #1   Title Pt to be able to verbalize the signs and symptoms of cellulitis and to be able to verbalize that she understands that people diagnosed with  lymphedema hae an increase risk of cellulitis   Time 2   Period Weeks   Status Achieved   PT SHORT TERM GOAL #2   Title Pt pain to be no greater than a 4/10 throughout the day to allow pt to amblulate for at least 10 minutes at a time   Time 4   Period Weeks   PT SHORT TERM GOAL #3   Title Pt to volume to decrease by 20% for improved fitting of clothes    Time 4   Period Weeks   Status New   PT SHORT TERM GOAL #4   Title Pt feeling of heaviness to decrease so that pt is able to easily lift her legs up onto the bed or couch    Time 4   Period Weeks   Status New           PT Long Term Goals - 08/06/15 1614    PT LONG TERM GOAL #1   Title PT volume to decrease by 35% to allow pt to fit into her compression stockings.    Time 8   Period Weeks   Status New   PT LONG TERM GOAL #2   Title Pt pain to be no greater  than a 2/10 to allow pt feel confident going to and participating in [place] dances    Time 8   Period Weeks   PT LONG TERM GOAL #3   Title Pt will be independent in donning/doffing compression garments for long term management of lymphedema.   Time 8   Period Weeks   Status New               Plan - 08/08/15 1200    Clinical Impression Statement LE's cleansed well and moisturized following manual and prior to rebandaging using profore system.  Pt with questions regarding her meds and was referred to go over to [place] for instructions.  Overall improvement in edema with no induration present today.  Continued with toe bandaging as well.     Pt will benefit from skilled therapeutic intervention in order to improve on the following deficits Pain;Difficulty walking;Decreased activity tolerance;Decreased balance;Increased edema   Rehab Potential Good   PT Frequency 2x / week   PT Duration 8 weeks   PT Treatment/Interventions Manual lymph drainage;Compression bandaging;Patient/family education   PT Next Visit Plan continue with manual techniques.  remeasure next session as may be able to transition to garments when volume returns to smallest value.         Problem Combs Patient Active Problem Combs   Diagnosis Date Noted  . CAP (community acquired pneumonia) 07/25/2015  . Pressure ulcer 05/02/2015  . Hyponatremia 05/01/2015  . Acute-on-chronic kidney injury ([place]) 05/01/2015  . Diabetes mellitus with hyperglycemia, with long-term current use of insulin ([place]) 05/01/2015  . COPD exacerbation ([place]) 04/30/2015  . Cellulitis of foot, right 03/24/2015  . Elevated d-dimer 03/24/2015  . Peripheral edema 12/04/2014  . Bilateral edema of lower extremity   . Diabetic ulcer of right great toe ([place])   . Foot ulcer due to secondary DM ([place]) 11/07/2014  . Diabetic ulcer of right foot ([place]) 11/07/2014  . Edema of lower extremity 05/27/2013  . Headache 05/26/2013  . CKD (chronic  kidney disease), stage III 04/14/2013  . Leukocytosis 04/14/2013  . Anemia in CKD (chronic kidney disease) 04/14/2013  . Dyspnea 04/14/2013  . Chronic diastolic CHF (congestive heart failure) ([place])   . COPD (chronic obstructive pulmonary disease) ([place])  08/07/2012  . Morbid obesity ([place]) 08/07/2012  . DM type 2 (diabetes mellitus, type 2) ([place]) 08/07/2012  . HTN (hypertension), benign 08/07/2012  . Hypothyroidism (acquired) 08/07/2012    Teena Irani, PTA/CLT [phone]  08/08/2015, 12:04 PM  [facility] [address] [place], [name], [address] Phone: [phone]   Fax:  [phone]  Name: Stephanie [facility]bs MRN: [id number] Date of Birth: Dec 02, 1950

## 2015-08-13 ENCOUNTER — Encounter (HOSPITAL_COMMUNITY): Payer: Medicare Other | Admitting: Physical Therapy

## 2015-08-14 ENCOUNTER — Ambulatory Visit (HOSPITAL_COMMUNITY): Payer: Medicare Other | Attending: Internal Medicine | Admitting: Physical Therapy

## 2015-08-14 DIAGNOSIS — T148 Other injury of unspecified body region: Secondary | ICD-10-CM | POA: Diagnosis present

## 2015-08-14 DIAGNOSIS — M6281 Muscle weakness (generalized): Secondary | ICD-10-CM | POA: Insufficient documentation

## 2015-08-14 DIAGNOSIS — R2689 Other abnormalities of gait and mobility: Secondary | ICD-10-CM | POA: Diagnosis present

## 2015-08-14 DIAGNOSIS — I890 Lymphedema, not elsewhere classified: Secondary | ICD-10-CM | POA: Diagnosis not present

## 2015-08-14 DIAGNOSIS — R269 Unspecified abnormalities of gait and mobility: Secondary | ICD-10-CM | POA: Diagnosis present

## 2015-08-14 DIAGNOSIS — R29898 Other symptoms and signs involving the musculoskeletal system: Secondary | ICD-10-CM | POA: Insufficient documentation

## 2015-08-14 DIAGNOSIS — T148XXA Other injury of unspecified body region, initial encounter: Secondary | ICD-10-CM

## 2015-08-14 DIAGNOSIS — R262 Difficulty in walking, not elsewhere classified: Secondary | ICD-10-CM | POA: Insufficient documentation

## 2015-08-14 NOTE — Therapy (Signed)
[place], [name], [address] Phone: [phone]   Fax:  [phone]  Physical Therapy Treatment  Patient Details  Name: Stephanie [facility]bs MRN: [id number] Date of Birth: Oct 02, 1950 Referring Provider: Stoney Bang  Encounter Date: 08/14/2015      PT End of Session - 08/14/15 1832    Visit Number 3   Number of Visits 16   Date for PT Re-Evaluation 09/05/15   Authorization Type UHC medicare    Authorization - Visit Number 3   Authorization - Number of Visits 10   PT Start Time 1608   PT Stop Time 1730   PT Time Calculation (min) 82 min   Activity Tolerance Patient tolerated treatment well   Behavior During Therapy [facility] for tasks assessed/performed      Past Medical History  Diagnosis Date  . Hypertension   . Diabetes mellitus without complication ([place])   . COPD (chronic obstructive pulmonary disease) ([place])   . CHF (congestive heart failure) ([facility])     Diastolic  . Anxiety   . Chronic back pain   . Hypothyroidism   . Pedal edema   . On home O2     2.5 L N/C prn  . Degenerative disk disease     Past Surgical History  Procedure Laterality Date  . Tubal ligation    . Hernia repair      There were no vitals filed for this visit.  Visit Diagnosis:  Lymphedema  Nonhealing nonsurgical wound  Leg heaviness  Difficulty walking  Abnormal gait      Subjective Assessment - 08/14/15 1827    Subjective PT states she got her meds straightened out and knows how to work her nebulizer now.  Pt still using oxygen and has remained smoke free.     Currently in Pain? No/denies                       Wound Therapy    Wound Properties Date First Assessed: 11/07/14 Location: Toe (Comment  which one) , right great toe  Location Orientation: Right Wound Description (Comments): small open hole under the right grest toe          [facility] Adult PT Treatment/Exercise - 08/14/15 0001    Manual Therapy    Manual Therapy Manual Lymphatic Drainage (MLD)   Manual therapy comments manual done prior to bandaging to bilateral LE    Manual Lymphatic Drainage (MLD) Supraclavicular, deep and superfical abdominal, Routing fluid using inguinal/axillary anastomsis followed by toe wrapping and profore compression dressing                   PT Short Term Goals - 08/06/15 1609    PT SHORT TERM GOAL #1   Title Pt to be able to verbalize the signs and symptoms of cellulitis and to be able to verbalize that she understands that people diagnosed with lymphedema hae an increase risk of cellulitis   Time 2   Period Weeks   Status Achieved   PT SHORT TERM GOAL #2   Title Pt pain to be no greater than a 4/10 throughout the day to allow pt to amblulate for at least 10 minutes at a time   Time 4   Period Weeks   PT SHORT TERM GOAL #3   Title Pt to volume to decrease by 20% for improved fitting of clothes    Time 4   Period Weeks  Status New   PT SHORT TERM GOAL #4   Title Pt feeling of heaviness to decrease so that pt is able to easily lift her legs up onto the bed or couch    Time 4   Period Weeks   Status New           PT Long Term Goals - 08/06/15 1614    PT LONG TERM GOAL #1   Title PT volume to decrease by 35% to allow pt to fit into her compression stockings.    Time 8   Period Weeks   Status New   PT LONG TERM GOAL #2   Title Pt pain to be no greater than a 2/10 to allow pt feel confident going to and participating in [place] dances    Time 8   Period Weeks   PT LONG TERM GOAL #3   Title Pt will be independent in donning/doffing compression garments for long term management of lymphedema.   Time 8   Period Weeks   Status New               Plan - 08/14/15 1832    Clinical Impression Statement Continued with lymphedema treatment today.  Noted redness around nailbed of Lt middle toe.  Pt reported she hit it on the vaccum cleaner.  LE's cleansed and moisturized  following massage and prior to rebandaging.  Pt reported overall comfort at end of session.    Pt will benefit from skilled therapeutic intervention in order to improve on the following deficits Pain;Difficulty walking;Decreased activity tolerance;Decreased balance;Increased edema   Rehab Potential Good   PT Frequency 2x / week   PT Duration 8 weeks   PT Treatment/Interventions Manual lymph drainage;Compression bandaging;Patient/family education   PT Next Visit Plan continue with manual techniques.  remeasure next session as may be able to transition to garments when volume returns to smallest value.         Problem List Patient Active Problem List   Diagnosis Date Noted  . CAP (community acquired pneumonia) 07/25/2015  . Pressure ulcer 05/02/2015  . Hyponatremia 05/01/2015  . Acute-on-chronic kidney injury ([place]) 05/01/2015  . Diabetes mellitus with hyperglycemia, with long-term current use of insulin ([place]) 05/01/2015  . COPD exacerbation ([place]) 04/30/2015  . Cellulitis of foot, right 03/24/2015  . Elevated d-dimer 03/24/2015  . Peripheral edema 12/04/2014  . Bilateral edema of lower extremity   . Diabetic ulcer of right great toe ([place])   . Foot ulcer due to secondary DM ([place]) 11/07/2014  . Diabetic ulcer of right foot ([place]) 11/07/2014  . Edema of lower extremity 05/27/2013  . Headache 05/26/2013  . CKD (chronic kidney disease), stage III 04/14/2013  . Leukocytosis 04/14/2013  . Anemia in CKD (chronic kidney disease) 04/14/2013  . Dyspnea 04/14/2013  . Chronic diastolic CHF (congestive heart failure) ([place])   . COPD (chronic obstructive pulmonary disease) ([place]) 08/07/2012  . Morbid obesity ([place]) 08/07/2012  . DM type 2 (diabetes mellitus, type 2) ([place]) 08/07/2012  . HTN (hypertension), benign 08/07/2012  . Hypothyroidism (acquired) 08/07/2012    Teena Irani, PTA/CLT [phone] 08/14/2015, 6:36 PM  [place], [name], [address] Phone: [phone]   Fax:  [phone]  Name: Stephanie [facility]bs MRN: [id number] Date of Birth: 25-Nov-1950

## 2015-08-15 ENCOUNTER — Encounter (HOSPITAL_COMMUNITY): Payer: Medicare Other | Admitting: Physical Therapy

## 2015-08-16 ENCOUNTER — Ambulatory Visit (HOSPITAL_COMMUNITY): Payer: Medicare Other | Admitting: Physical Therapy

## 2015-08-16 DIAGNOSIS — R262 Difficulty in walking, not elsewhere classified: Secondary | ICD-10-CM

## 2015-08-16 DIAGNOSIS — M6281 Muscle weakness (generalized): Secondary | ICD-10-CM | POA: Diagnosis not present

## 2015-08-16 DIAGNOSIS — T148 Other injury of unspecified body region: Secondary | ICD-10-CM | POA: Diagnosis not present

## 2015-08-16 DIAGNOSIS — I890 Lymphedema, not elsewhere classified: Secondary | ICD-10-CM | POA: Diagnosis not present

## 2015-08-16 DIAGNOSIS — I208 Other forms of angina pectoris: Secondary | ICD-10-CM | POA: Diagnosis not present

## 2015-08-16 DIAGNOSIS — R269 Unspecified abnormalities of gait and mobility: Secondary | ICD-10-CM | POA: Diagnosis not present

## 2015-08-16 DIAGNOSIS — R29898 Other symptoms and signs involving the musculoskeletal system: Secondary | ICD-10-CM | POA: Diagnosis not present

## 2015-08-16 DIAGNOSIS — Z0000 Encounter for general adult medical examination without abnormal findings: Secondary | ICD-10-CM | POA: Diagnosis not present

## 2015-08-16 DIAGNOSIS — R2689 Other abnormalities of gait and mobility: Secondary | ICD-10-CM | POA: Diagnosis not present

## 2015-08-16 NOTE — Therapy (Signed)
[place], [name], [address] Phone: [phone]   Fax:  [phone]  Physical Therapy Treatment  Patient Details  Name: Stephanie Combs MRN: [id number] Date of Birth: 12-May-1951 Referring Provider: Stoney Bang  Encounter Date: 08/16/2015      PT End of Session - 08/16/15 1905    Visit Number 4   Number of Visits 16   Date for PT Re-Evaluation 09/05/15   Authorization Type U[employer] medicare    Authorization - Visit Number 4   Authorization - Number of Visits 10   PT Start Time 1450   PT Stop Time 1608   PT Time Calculation (min) 78 min   Activity Tolerance Patient tolerated treatment well   Behavior During Therapy [facility] for tasks assessed/performed      Past Medical History  Diagnosis Date  . Hypertension   . Diabetes mellitus without complication ([place])   . COPD (chronic obstructive pulmonary disease) ([place])   . CHF (congestive heart failure) ([facility])     Diastolic  . Anxiety   . Chronic back pain   . Hypothyroidism   . Pedal edema   . On home O2     2.5 L N/C prn  . Degenerative disk disease     Past Surgical History  Procedure Laterality Date  . Tubal ligation    . Hernia repair      There were no vitals filed for this visit.      Subjective Assessment - 08/16/15 1901    Subjective Pt states she is doing well today.  No pain or complaints.   Currently in Pain? No/denies                         [facility] Adult PT Treatment/Exercise - 08/16/15 0001    Manual Therapy   Manual Therapy Manual Lymphatic Drainage (MLD)   Manual therapy comments manual done prior to bandaging to bilateral LE    Manual Lymphatic Drainage (MLD) Supraclavicular, deep and superfical abdominal, Routing fluid using inguinal/axillary anastomsis followed by toe wrapping and profore compression dressing           05/16/2015 05/16/2015 06/06/2015 06/06/2015 06/15/2015 06/15/2015 08/06/2015 08/06/2015  right left  right left right  Left right Left   30.00 29.20 26.8 26.5 27.5 26.7 29.7 48.3  32 31.5 32.00 31.00 28.80 28.00 31.50 31.30  41.20 34.50 28.40 28.70 29.00 28.70 38.00 35.20  47.00 42.00 34.70 33.20 24.50 31.70 45.00 41.70  53.00 54.30 40.50 39.40 39.00 38.00 49.70 48.00  55.50 55.20 45.00 43.30 43.50 41.20 51.80 52.10  56.50 54.00 48.00 46.00 46.20 45.30 53.00 53.70  54.20 47.00 48.50 47.00 49.00 45.80 52.90 51.90                    17849.58 15919.67 12074.39 11341.43 10975.63 10616.64 16104.08 16886.22  5681.7 6283.6629 3843.3991 4765.4650 3546.5681 3379.383 5126.0897 [id number]       todays measurements:  08/16/2015   Rt Lt  27 25.5  30.80 31.20  29.00 26.50  36.00 29.00  39.50 34.20  44.00 40.00  47.00 42.00  49.40 43.50        11960.25 9592.83  3807.067 3053.494             PT Short Term Goals - 08/16/15 1901    PT SHORT TERM GOAL #1   Title Pt to be able to verbalize the signs and symptoms of cellulitis and to be able  to verbalize that she understands that people diagnosed with lymphedema hae an increase risk of cellulitis   Time 2   Period Weeks   Status Achieved   PT SHORT TERM GOAL #2   Title Pt pain to be no greater than a 4/10 throughout the day to allow pt to amblulate for at least 10 minutes at a time   Time 4   Period Weeks   Status Partially Met   PT SHORT TERM GOAL #3   Title Pt to volume to decrease by 20% for improved fitting of clothes    Time 4   Period Weeks   Status On-going   PT SHORT TERM GOAL #4   Title Pt feeling of heaviness to decrease so that pt is able to easily lift her legs up onto the bed or couch    Time 4   Period Weeks   Status On-going           PT Long Term Goals - 08/16/15 1902    PT LONG TERM GOAL #1   Title PT volume to decrease by 35% to allow pt to fit into her compression stockings.    Time 8   Period  Weeks   Status Partially Met   PT LONG TERM GOAL #2   Title Pt pain to be no greater than a 2/10 to allow pt feel confident going to and participating in [place] dances    Time 8   Period Weeks   Status Partially Met   PT LONG TERM GOAL #3   Title Pt will be independent in donning/doffing compression garments for long term management of lymphedema.   Time 8   Period Weeks   Status On-going               Plan - 08/16/15 1906    Clinical Impression Statement LE's remeasured today with overall decongestion noted nearly back to original size before hospital admission.  Overall oss of 1,318.4cc from Rt LE and 2,321.6 cc from Lt LE.  Pt instructed to bring compression garments next session to attempt to work with these.     Rehab Potential Good   PT Frequency 2x / week   PT Duration 8 weeks   PT Treatment/Interventions Manual lymph drainage;Compression bandaging;Patient/family education   PT Next Visit Plan Pt to bring compression garments next session to check fit.        Patient will benefit from skilled therapeutic intervention in order to improve the following deficits and impairments:  Pain, Difficulty walking, Decreased activity tolerance, Decreased balance, Increased edema  Visit Diagnosis: Lymphedema, not elsewhere classified  Muscle weakness (generalized)  Difficulty in walking, not elsewhere classified  Other abnormalities of gait and mobility     Problem List Patient Active Problem List   Diagnosis Date Noted  . CAP (community acquired pneumonia) 07/25/2015  . Pressure ulcer 05/02/2015  . Hyponatremia 05/01/2015  . Acute-on-chronic kidney injury ([place]) 05/01/2015  . Diabetes mellitus with hyperglycemia, with long-term current use of insulin ([place]) 05/01/2015  . COPD exacerbation ([place]) 04/30/2015  . Cellulitis of foot, right 03/24/2015  . Elevated d-dimer 03/24/2015  . Peripheral edema 12/04/2014  . Bilateral edema of lower extremity   . Diabetic ulcer  of right great toe ([place])   . Foot ulcer due to secondary DM ([place]) 11/07/2014  . Diabetic ulcer of right foot ([place]) 11/07/2014  . Edema of lower extremity 05/27/2013  . Headache 05/26/2013  . CKD (chronic kidney disease), stage III 04/14/2013  .  Leukocytosis 04/14/2013  . Anemia in CKD (chronic kidney disease) 04/14/2013  . Dyspnea 04/14/2013  . Chronic diastolic CHF (congestive heart failure) ([place])   . COPD (chronic obstructive pulmonary disease) ([place]) 08/07/2012  . Morbid obesity ([place]) 08/07/2012  . DM type 2 (diabetes mellitus, type 2) ([place]) 08/07/2012  . HTN (hypertension), benign 08/07/2012  . Hypothyroidism (acquired) 08/07/2012    Teena Irani, PTA/CLT [phone]  08/16/2015, 7:08 PM Rayetta Humphrey, PT CLT [place] [address] [place], [name], [address] Phone: [phone]   Fax:  [phone]  Name: Stephanie Combs MRN: [id number] Date of Birth: 03-Jun-1950

## 2015-08-20 ENCOUNTER — Ambulatory Visit (HOSPITAL_COMMUNITY): Payer: Medicare Other | Admitting: Physical Therapy

## 2015-08-20 DIAGNOSIS — M6281 Muscle weakness (generalized): Secondary | ICD-10-CM | POA: Diagnosis not present

## 2015-08-20 DIAGNOSIS — I890 Lymphedema, not elsewhere classified: Secondary | ICD-10-CM

## 2015-08-20 DIAGNOSIS — R29898 Other symptoms and signs involving the musculoskeletal system: Secondary | ICD-10-CM | POA: Diagnosis not present

## 2015-08-20 DIAGNOSIS — R262 Difficulty in walking, not elsewhere classified: Secondary | ICD-10-CM

## 2015-08-20 DIAGNOSIS — R2689 Other abnormalities of gait and mobility: Secondary | ICD-10-CM | POA: Diagnosis not present

## 2015-08-20 DIAGNOSIS — T148 Other injury of unspecified body region: Secondary | ICD-10-CM | POA: Diagnosis not present

## 2015-08-20 DIAGNOSIS — R269 Unspecified abnormalities of gait and mobility: Secondary | ICD-10-CM | POA: Diagnosis not present

## 2015-08-20 NOTE — Therapy (Signed)
[name], [name], [address] Phone: [phone]   Fax:  [phone]  Physical Therapy Treatment  Patient Details  Name: Stephanie Combs MRN: [id number] Date of Birth: 06/23/1950 Referring Provider: Stoney Bang  Encounter Date: 08/20/2015      PT End of Session - 08/20/15 1626    Visit Number 5   Number of Visits [place] - Visit Number 5   Authorization - Number of Visits 10   PT Start Time 6962   PT Stop Time 1555   PT Time Calculation (min) 95 min   Activity Tolerance Patient tolerated treatment well   Behavior During Therapy [facility] for tasks assessed/performed      Past Medical History  Diagnosis Date  . Hypertension   . Diabetes mellitus without complication ([place])   . COPD (chronic obstructive pulmonary disease) ([place])   . CHF (congestive heart failure) ([facility])     Diastolic  . Anxiety   . Chronic back pain   . Hypothyroidism   . Pedal edema   . On home O2     2.5 L N/C prn  . Degenerative disk disease     Past Surgical History  Procedure Laterality Date  . Tubal ligation    . Hernia repair      There were no vitals filed for this visit.      Subjective Assessment - 08/20/15 1620    Subjective Pt states that her bandages were really hurting last night but she did not take them off.    Pertinent History [name] was admitted into [facility] on 3/15 for acute COPD.  She was discharged on 07/27/2015.  She was then admitted into [facility] on 07/31/2015 with congestive heart failure and discharged on 08/03/2015. She comes back to outpatient therapy seeking treatment of her lymphedma which has exacerbated due to the patient not having any compression on her LE since 3/17.  She states that she did recieve her compression garment but by the time she recieved them her legs had swollen so much that they no longer fit her.      Pain Score 8    Pain Location  Toe (Comment which one)   Pain Orientation Right   Pain Descriptors / Indicators Burning   Pain Type Acute pain   Pain Onset In the past 7 days   Pain Frequency Constant   Aggravating Factors  wraps   Pain Relieving Factors taking bandages off                        Wound Therapy    Wound Properties Date First Assessed: 11/07/14 Location: Toe (Comment  which one) , right great toe  Location Orientation: Right Wound Description (Comments): small open hole under the right grest toe          [facility] Adult PT Treatment/Exercise - 08/20/15 0001    Manual Therapy   Manual Therapy Manual Lymphatic Drainage (MLD)   Manual therapy comments manual done prior to bandaging to bilateral LE    Manual Lymphatic Drainage (MLD) Supraclavicular, deep and superfical abdominal, Routing fluid using inguinal/axillary anastomsis and LE.  Manual followed by donning of compression garment bilaterally.  Pt shown various donning equipment to assist in doning garments.                  PT Education - 08/20/15 1624  Education provided Yes   Education Details stressed that she should take dressing/ garment off when they are painful as they can cause damage.  Pt states she just wanted to deal with it.  Explained you should never deal with pain it is the body's way of warning you that something is not right.    Person(s) Educated Patient   Methods Explanation   Comprehension Verbalized understanding          PT Short Term Goals - 08/16/15 1901    PT SHORT TERM GOAL #1   Title Pt to be able to verbalize the signs and symptoms of cellulitis and to be able to verbalize that she understands that people diagnosed with lymphedema hae an increase risk of cellulitis   Time 2   Period Weeks   Status Achieved   PT SHORT TERM GOAL #2   Title Pt pain to be no greater than a 4/10 throughout the day to allow pt to amblulate for at least 10 minutes at a time   Time 4   Period Weeks   Status  Partially Met   PT SHORT TERM GOAL #3   Title Pt to volume to decrease by 20% for improved fitting of clothes    Time 4   Period Weeks   Status On-going   PT SHORT TERM GOAL #4   Title Pt feeling of heaviness to decrease so that pt is able to easily lift her legs up onto the bed or couch    Time 4   Period Weeks   Status On-going           PT Long Term Goals - 08/16/15 1902    PT LONG TERM GOAL #1   Title PT volume to decrease by 35% to allow pt to fit into her compression stockings.    Time 8   Period Weeks   Status Partially Met   PT LONG TERM GOAL #2   Title Pt pain to be no greater than a 2/10 to allow pt feel confident going to and participating in [place] dances    Time 8   Period Weeks   Status Partially Met   PT LONG TERM GOAL #3   Title Pt will be independent in donning/doffing compression garments for long term management of lymphedema.   Time 8   Period Weeks   Status On-going               Plan - 08/20/15 1626    Clinical Impression Statement Pt toe and profore bandages slid causing unsegmental edema.  Rt second and third toes red and swollen but inspection shows no broken skin.  Therapist spent excessive time with manual lymph massage at Rt toes and forefoot with noted decrease of swelling prior to donning of compression garment.  Pt verbalized the desire to attemp donning with rubber gloves only when therapist  showed patient various donning equipment..  Pt will need skilled care only until she is independent with donning of her garments at this time.    Rehab Potential Good   PT Frequency 2x / week   PT Duration 8 weeks   PT Next Visit Plan Practice donning and doffing compression garment.       Patient will benefit from skilled therapeutic intervention in order to improve the following deficits and impairments:  Pain, Difficulty walking, Decreased activity tolerance, Decreased balance, Increased edema  Visit Diagnosis: Lymphedema, not elsewhere  classified  Muscle weakness (generalized)  Difficulty in walking,  not elsewhere classified  Other abnormalities of gait and mobility     Problem List Patient Active Problem List   Diagnosis Date Noted  . CAP (community acquired pneumonia) 07/25/2015  . Pressure ulcer 05/02/2015  . Hyponatremia 05/01/2015  . Acute-on-chronic kidney injury ([place]) 05/01/2015  . Diabetes mellitus with hyperglycemia, with long-term current use of insulin ([place]) 05/01/2015  . COPD exacerbation ([place]) 04/30/2015  . Cellulitis of foot, right 03/24/2015  . Elevated d-dimer 03/24/2015  . Peripheral edema 12/04/2014  . Bilateral edema of lower extremity   . Diabetic ulcer of right great toe ([place])   . Foot ulcer due to secondary DM ([place]) 11/07/2014  . Diabetic ulcer of right foot ([place]) 11/07/2014  . Edema of lower extremity 05/27/2013  . Headache 05/26/2013  . CKD (chronic kidney disease), stage III 04/14/2013  . Leukocytosis 04/14/2013  . Anemia in CKD (chronic kidney disease) 04/14/2013  . Dyspnea 04/14/2013  . Chronic diastolic CHF (congestive heart failure) ([place])   . COPD (chronic obstructive pulmonary disease) ([place]) 08/07/2012  . Morbid obesity ([place]) 08/07/2012  . DM type 2 (diabetes mellitus, type 2) ([place]) 08/07/2012  . HTN (hypertension), benign 08/07/2012  . Hypothyroidism (acquired) 08/07/2012    Rayetta Humphrey, PT CLT [phone] 08/20/2015, 4:34 PM  [place] [address] [place], [name], [address] Phone: [phone]   Fax:  [phone]  Name: CHARONDA HEFTER MRN: [id number] Date of Birth: 09-27-1950

## 2015-08-24 ENCOUNTER — Ambulatory Visit (HOSPITAL_COMMUNITY): Payer: Medicare Other | Admitting: Physical Therapy

## 2015-08-24 DIAGNOSIS — R262 Difficulty in walking, not elsewhere classified: Secondary | ICD-10-CM | POA: Diagnosis not present

## 2015-08-24 DIAGNOSIS — I890 Lymphedema, not elsewhere classified: Secondary | ICD-10-CM | POA: Diagnosis not present

## 2015-08-24 DIAGNOSIS — R29898 Other symptoms and signs involving the musculoskeletal system: Secondary | ICD-10-CM | POA: Diagnosis not present

## 2015-08-24 DIAGNOSIS — T148 Other injury of unspecified body region: Secondary | ICD-10-CM | POA: Diagnosis not present

## 2015-08-24 DIAGNOSIS — R2689 Other abnormalities of gait and mobility: Secondary | ICD-10-CM

## 2015-08-24 DIAGNOSIS — M6281 Muscle weakness (generalized): Secondary | ICD-10-CM

## 2015-08-24 DIAGNOSIS — R269 Unspecified abnormalities of gait and mobility: Secondary | ICD-10-CM | POA: Diagnosis not present

## 2015-08-24 NOTE — Therapy (Signed)
[place], [name], [address] Phone: [phone]   Fax:  [phone]  Physical Therapy Treatment  Patient Details  Name: Stephanie Combs MRN: [id number] Date of Birth: 1950-05-23 Referring Provider: Stoney Bang  Encounter Date: 08/24/2015      PT End of Session - 08/24/15 1426    Visit Number 6   Number of Visits 16   Date for PT Re-Evaluation 09/05/15   Authorization Type UHC medicare    Authorization - Visit Number 6   Authorization - Number of Visits 10   PT Start Time 0932   PT Stop Time 1417   PT Time Calculation (min) 75 min   Activity Tolerance Patient tolerated treatment well   Behavior During Therapy [facility] for tasks assessed/performed      Past Medical History  Diagnosis Date  . Hypertension   . Diabetes mellitus without complication ([place])   . COPD (chronic obstructive pulmonary disease) ([place])   . CHF (congestive heart failure) ([facility])     Diastolic  . Anxiety   . Chronic back pain   . Hypothyroidism   . Pedal edema   . On home O2     2.5 L N/C prn  . Degenerative disk disease     Past Surgical History  Procedure Laterality Date  . Tubal ligation    . Hernia repair      There were no vitals filed for this visit.      Subjective Assessment - 08/24/15 1422    Subjective Pt states that she has not taken her compression garments off but they have been hurting her for the past two days especially at the bend of her ankle.    Pertinent History [name] was admitted into [facility] on 3/15 for acute COPD.  She was discharged on 07/27/2015.  She was then admitted into [facility] on 07/31/2015 with congestive heart failure and discharged on 08/03/2015. She comes back to outpatient therapy seeking treatment of her lymphedma which has exacerbated due to the patient not having any compression on her LE since 3/17.  She states that she did recieve her compression garment but by the time she recieved  them her legs had swollen so much that they no longer fit her.      Currently in Pain? Yes   Pain Score 5    Pain Location Ankle   Pain Orientation Right;Left   Pain Descriptors / Indicators Burning   Pain Type Acute pain   Pain Radiating Towards no radiation    Pain Onset In the past 7 days   Pain Frequency Constant   Aggravating Factors  walking    Pain Relieving Factors being still                       Wound Therapy    Wound Properties Date First Assessed: 11/07/14 Location: Toe (Comment  which one) , right great toe  Location Orientation: Right Wound Description (Comments): small open hole under the right grest toe          [facility] Adult PT Treatment/Exercise - 08/24/15 0001    Manual Therapy   Manual Therapy Manual Lymphatic Drainage (MLD)   Manual therapy comments manual done prior to bandaging to bilateral LE    Manual Lymphatic Drainage (MLD) Supraclavicular, deep and superfical abdominal, Routing fluid using inguinal/axillary anastomsis followed by toe wrapping and profore compression dressing (profore to bilateral LE)  PT Education - 08/24/15 1436    Education provided Yes   Education Details Stressed that garments and compression dressings should be pain free and should be taken off if they are not.    Person(s) Educated Patient   Methods Explanation   Comprehension Verbalized understanding          PT Short Term Goals - 08/16/15 1901    PT SHORT TERM GOAL #1   Title Pt to be able to verbalize the signs and symptoms of cellulitis and to be able to verbalize that she understands that people diagnosed with lymphedema hae an increase risk of cellulitis   Time 2   Period Weeks   Status Achieved   PT SHORT TERM GOAL #2   Title Pt pain to be no greater than a 4/10 throughout the day to allow pt to amblulate for at least 10 minutes at a time   Time 4   Period Weeks   Status Partially Met   PT SHORT TERM GOAL #3   Title Pt to  volume to decrease by 20% for improved fitting of clothes    Time 4   Period Weeks   Status On-going   PT SHORT TERM GOAL #4   Title Pt feeling of heaviness to decrease so that pt is able to easily lift her legs up onto the bed or couch    Time 4   Period Weeks   Status On-going           PT Long Term Goals - 08/16/15 1902    PT LONG TERM GOAL #1   Title PT volume to decrease by 35% to allow pt to fit into her compression stockings.    Time 8   Period Weeks   Status Partially Met   PT LONG TERM GOAL #2   Title Pt pain to be no greater than a 2/10 to allow pt feel confident going to and participating in [place] dances    Time 8   Period Weeks   Status Partially Met   PT LONG TERM GOAL #3   Title Pt will be independent in donning/doffing compression garments for long term management of lymphedema.   Time 8   Period Weeks   Status On-going               Plan - 08/24/15 1429    Clinical Impression Statement Pt garments doffed by therapist exposing skin irritation,(dark redness that did not resolve with time), on dorsal aspect of bilateral ankles.  Pt toes and forefoot with decreased swelling but swelling in forefoot continues to be more than in patient's ankles.  Due to skin irritation therapist opted to place profore on B LE with cotton build up at ankle to produce equalized pressure.  Therapist stressed once again that dressings and garments should be painfree and should be taken off if they are causing any pain.    Rehab Potential Good   PT Frequency 2x / week   PT Duration 8 weeks   PT Treatment/Interventions Manual lymph drainage;Compression bandaging;Patient/family education   PT Next Visit Plan Pt to bring in shoes and garden gloves to practice donning of compression garment next viist.       Patient will benefit from skilled therapeutic intervention in order to improve the following deficits and impairments:  Pain, Difficulty walking, Decreased activity  tolerance, Decreased balance, Increased edema  Visit Diagnosis: Lymphedema, not elsewhere classified  Muscle weakness (generalized)  Difficulty in walking, not elsewhere classified  Other abnormalities of gait and mobility     Problem List Patient Active Problem List   Diagnosis Date Noted  . CAP (community acquired pneumonia) 07/25/2015  . Pressure ulcer 05/02/2015  . Hyponatremia 05/01/2015  . Acute-on-chronic kidney injury ([place]) 05/01/2015  . Diabetes mellitus with hyperglycemia, with long-term current use of insulin ([place]) 05/01/2015  . COPD exacerbation ([place]) 04/30/2015  . Cellulitis of foot, right 03/24/2015  . Elevated d-dimer 03/24/2015  . Peripheral edema 12/04/2014  . Bilateral edema of lower extremity   . Diabetic ulcer of right great toe ([place])   . Foot ulcer due to secondary DM ([place]) 11/07/2014  . Diabetic ulcer of right foot ([place]) 11/07/2014  . Edema of lower extremity 05/27/2013  . Headache 05/26/2013  . CKD (chronic kidney disease), stage III 04/14/2013  . Leukocytosis 04/14/2013  . Anemia in CKD (chronic kidney disease) 04/14/2013  . Dyspnea 04/14/2013  . Chronic diastolic CHF (congestive heart failure) ([place])   . COPD (chronic obstructive pulmonary disease) ([place]) 08/07/2012  . Morbid obesity ([place]) 08/07/2012  . DM type 2 (diabetes mellitus, type 2) ([place]) 08/07/2012  . HTN (hypertension), benign 08/07/2012  . Hypothyroidism (acquired) 08/07/2012   Rayetta Humphrey, PT CLT [phone] 08/24/2015, 2:37 PM  [facility] [address] [place], [name], [address] Phone: [phone]   Fax:  [phone]  Name: CHANTIL BARI MRN: [id number] Date of Birth: [birth year]

## 2015-08-29 ENCOUNTER — Ambulatory Visit (HOSPITAL_COMMUNITY): Payer: Medicare Other | Admitting: Physical Therapy

## 2015-08-29 DIAGNOSIS — R29898 Other symptoms and signs involving the musculoskeletal system: Secondary | ICD-10-CM | POA: Diagnosis not present

## 2015-08-29 DIAGNOSIS — R262 Difficulty in walking, not elsewhere classified: Secondary | ICD-10-CM

## 2015-08-29 DIAGNOSIS — I890 Lymphedema, not elsewhere classified: Secondary | ICD-10-CM

## 2015-08-29 DIAGNOSIS — M6281 Muscle weakness (generalized): Secondary | ICD-10-CM

## 2015-08-29 DIAGNOSIS — R2689 Other abnormalities of gait and mobility: Secondary | ICD-10-CM

## 2015-08-29 DIAGNOSIS — R269 Unspecified abnormalities of gait and mobility: Secondary | ICD-10-CM | POA: Diagnosis not present

## 2015-08-29 DIAGNOSIS — T148 Other injury of unspecified body region: Secondary | ICD-10-CM | POA: Diagnosis not present

## 2015-08-29 NOTE — Therapy (Signed)
[place], [name], [address] Phone: [phone]   Fax:  [phone]  Physical Therapy Treatment  Patient Details  Name: Stephanie Combs MRN: [id number] Date of Birth: 03-25-1951 Referring Provider: Stoney Bang  Encounter Date: 08/29/2015      PT End of Session - 08/29/15 1541    Visit Number 7   Number of Visits 16   Date for PT Re-Evaluation 09/05/15   Authorization Type UHC medicare    Authorization - Visit Number 7   Authorization - Number of Visits 10   PT Start Time 8616   PT Stop Time 1525   PT Time Calculation (min) 80 min   Activity Tolerance Patient tolerated treatment well   Behavior During Therapy [facility] for tasks assessed/performed      Past Medical History  Diagnosis Date  . Hypertension   . Diabetes mellitus without complication ([place])   . COPD (chronic obstructive pulmonary disease) ([place])   . CHF (congestive heart failure) (HCC)     Diastolic  . Anxiety   . Chronic back pain   . Hypothyroidism   . Pedal edema   . On home O2     2.5 L N/C prn  . Degenerative disk disease     Past Surgical History  Procedure Laterality Date  . Tubal ligation    . Hernia repair      There were no vitals filed for this visit.      Subjective Assessment - 08/29/15 1537    Subjective Pt comes today wearing her profores.  States her toes and the bottoms of her feet are extremeley sore.  States she has an appointment with a cardiologist Friday afternoon.   Currently in Pain? No/denies                       Wound Therapy    Wound Properties Date First Assessed: 11/07/14 Location: Toe (Comment  which one) , right great toe  Location Orientation: Right Wound Description (Comments): small open hole under the right grest toe          [facility] Adult PT Treatment/Exercise - 08/29/15 0001    Manual Therapy   Manual Therapy Other (comment)   Manual therapy comments donning/doffing of  compression garments, general education.   Other Manual Therapy no time to complete MLD today, working with donning/doffing garments only                PT Education - 08/29/15 1555    Education provided Yes   Education Details Read assessment for all education provided.      Person(s) Educated Patient   Methods Explanation;Demonstration;Tactile cues;Verbal cues;Handout   Comprehension Verbalized understanding;Returned demonstration;Verbal cues required;Tactile cues required;Need further instruction          PT Short Term Goals - 08/16/15 1901    PT SHORT TERM GOAL #1   Title Pt to be able to verbalize the signs and symptoms of cellulitis and to be able to verbalize that she understands that people diagnosed with lymphedema hae an increase risk of cellulitis   Time 2   Period Weeks   Status Achieved   PT SHORT TERM GOAL #2   Title Pt pain to be no greater than a 4/10 throughout the day to allow pt to amblulate for at least 10 minutes at a time   Time 4   Period Weeks   Status Partially Met   PT SHORT  TERM GOAL #3   Title Pt to volume to decrease by 20% for improved fitting of clothes    Time 4   Period Weeks   Status On-going   PT SHORT TERM GOAL #4   Title Pt feeling of heaviness to decrease so that pt is able to easily lift her legs up onto the bed or couch    Time 4   Period Weeks   Status On-going           PT Long Term Goals - 08/16/15 1902    PT LONG TERM GOAL #1   Title PT volume to decrease by 35% to allow pt to fit into her compression stockings.    Time 8   Period Weeks   Status Partially Met   PT LONG TERM GOAL #2   Title Pt pain to be no greater than a 2/10 to allow pt feel confident going to and participating in [place] dances    Time 8   Period Weeks   Status Partially Met   PT LONG TERM GOAL #3   Title Pt will be independent in donning/doffing compression garments for long term management of lymphedema.   Time 8   Period Weeks    Status On-going               Plan - 08/29/15 1542    Clinical Impression Statement Pt was 15 minutes late for appt then on phone when entered clinic.  Pt with increased aggitation bilateral toes and bottoms of feet soreness. Upon removal of profores, noticed some slippage of toe bandaging on Rt foot with uneven compression.  Under third and fourth toes some irritation/redness but no opening or drainage noted.   Pt adamant that she was not wearing the hose, "they are too hot" and she would never be able to get them on or take them off.  Able to convince patient to try donning/doffing using the sock butler.  pt surprised as ability to complete task using this tool.  Pt only needed help getting sock completely onto tool and getting her heel to go into butler.  Pt able to get sock up independently from this point.  Able to don garment with extra time spent getting off over heel.  suggested a small shoe horn to wedge down to remove from heel.  Pt given information on medieven sock butler (size XL) on [employer] to order or to check with pharmacy.  Also recommended  dish glove.  Pt reported 100% comfort from garments when leaving.  able to get Croc shoes to fit over (it was raining) but instructed to remove upon return home and put socks over her feet.  Pt instructed to bring reidsleeves (she has not worn these as she has been able to don/doff) and additional stockings to session on Friday. discussed the importance of removing stockings each night, only wearing during day time hours.  Pt reports until she is able to get the butler, she is unable to get them  back on and will have to wear them until Friday.  Instructed to remove if increased pain, drainage or noted skin breakdown.  Pt verbalized understanding.     Rehab Potential Good   PT Frequency 2x / week   PT Duration 8 weeks   PT Treatment/Interventions Manual lymph drainage;Compression bandaging;Patient/family education   PT Next Visit Plan Pt to bring  in night compression and additional garments as well as kitchen gloves next session.  Follow up if able  to order/purchase a sock butler. Continue with donning/doffing to increase independence and proficiency in wearing garments as instructed.       Patient will benefit from skilled therapeutic intervention in order to improve the following deficits and impairments:  Pain, Difficulty walking, Decreased activity tolerance, Decreased balance, Increased edema  Visit Diagnosis: Lymphedema, not elsewhere classified  Muscle weakness (generalized)  Difficulty in walking, not elsewhere classified  Other abnormalities of gait and mobility     Problem List Patient Active Problem List   Diagnosis Date Noted  . CAP (community acquired pneumonia) 07/25/2015  . Pressure ulcer 05/02/2015  . Hyponatremia 05/01/2015  . Acute-on-chronic kidney injury ([place]) 05/01/2015  . Diabetes mellitus with hyperglycemia, with long-term current use of insulin ([place]) 05/01/2015  . COPD exacerbation ([place]) 04/30/2015  . Cellulitis of foot, right 03/24/2015  . Elevated d-dimer 03/24/2015  . Peripheral edema 12/04/2014  . Bilateral edema of lower extremity   . Diabetic ulcer of right great toe ([place])   . Foot ulcer due to secondary DM ([place]) 11/07/2014  . Diabetic ulcer of right foot ([place]) 11/07/2014  . Edema of lower extremity 05/27/2013  . Headache 05/26/2013  . CKD (chronic kidney disease), stage III 04/14/2013  . Leukocytosis 04/14/2013  . Anemia in CKD (chronic kidney disease) 04/14/2013  . Dyspnea 04/14/2013  . Chronic diastolic CHF (congestive heart failure) ([place])   . COPD (chronic obstructive pulmonary disease) ([place]) 08/07/2012  . Morbid obesity ([place]) 08/07/2012  . DM type 2 (diabetes mellitus, type 2) ([place]) 08/07/2012  . HTN (hypertension), benign 08/07/2012  . Hypothyroidism (acquired) 08/07/2012    Teena Irani, PTA/CLT [phone]  08/29/2015, 3:57 PM  [place] [address] [place], [name], [address] Phone: [phone]   Fax:  [phone]  Name: Stephanie Combs MRN: [id number] Date of Birth: Dec 17, 1950

## 2015-08-31 ENCOUNTER — Ambulatory Visit (HOSPITAL_COMMUNITY): Payer: Medicare Other | Admitting: Physical Therapy

## 2015-09-01 DIAGNOSIS — J449 Chronic obstructive pulmonary disease, unspecified: Secondary | ICD-10-CM | POA: Diagnosis not present

## 2015-09-03 ENCOUNTER — Encounter (HOSPITAL_COMMUNITY): Payer: Medicare Other | Admitting: Physical Therapy

## 2015-09-04 ENCOUNTER — Ambulatory Visit (HOSPITAL_COMMUNITY): Payer: Medicare Other | Admitting: Physical Therapy

## 2015-09-04 ENCOUNTER — Telehealth (HOSPITAL_COMMUNITY): Payer: Self-pay | Admitting: Physical Therapy

## 2015-09-04 DIAGNOSIS — I890 Lymphedema, not elsewhere classified: Secondary | ICD-10-CM

## 2015-09-04 DIAGNOSIS — R269 Unspecified abnormalities of gait and mobility: Secondary | ICD-10-CM | POA: Diagnosis not present

## 2015-09-04 DIAGNOSIS — R262 Difficulty in walking, not elsewhere classified: Secondary | ICD-10-CM

## 2015-09-04 DIAGNOSIS — R2689 Other abnormalities of gait and mobility: Secondary | ICD-10-CM | POA: Diagnosis not present

## 2015-09-04 DIAGNOSIS — R29898 Other symptoms and signs involving the musculoskeletal system: Secondary | ICD-10-CM | POA: Diagnosis not present

## 2015-09-04 DIAGNOSIS — T148 Other injury of unspecified body region: Secondary | ICD-10-CM | POA: Diagnosis not present

## 2015-09-04 DIAGNOSIS — M6281 Muscle weakness (generalized): Secondary | ICD-10-CM

## 2015-09-04 NOTE — Therapy (Signed)
[place], [name], [address] Phone: [phone]   Fax:  [phone]  Physical Therapy Treatment  Patient Details  Name: Stephanie Combs MRN: [id number] Date of Birth: [birth year] Referring Provider: Stoney Bang  Encounter Date: 09/04/2015      PT End of Session - 09/04/15 1647    Visit Number 8   Number of Visits 16   Date for PT Re-Evaluation 09/05/15   Authorization Type UHC medicare    Authorization - Visit Number 8   Authorization - Number of Visits 10   PT Start Time 7616   PT Stop Time 1515   PT Time Calculation (min) 67 min   Activity Tolerance Patient tolerated treatment well   Behavior During Therapy [facility] for tasks assessed/performed      Past Medical History  Diagnosis Date  . Hypertension   . Diabetes mellitus without complication ([place])   . COPD (chronic obstructive pulmonary disease) ([place])   . CHF (congestive heart failure) (H[facility])     Diastolic  . Anxiety   . Chronic back pain   . Hypothyroidism   . Pedal edema   . On home O2     2.5 L N/C prn  . Degenerative disk disease     Past Surgical History  Procedure Laterality Date  . Tubal ligation    . Hernia repair      There were no vitals filed for this visit.      Subjective Assessment - 09/04/15 1645    Subjective PT had to cancel last session due to stomach virus so has had current garments on X 6 days.  Pt reports she did have her daughter take them off once and put them back on during this time to wash them.  Pt reports no pain, only tenderness in feet.    Currently in Pain? No/denies                       Wound Therapy    Wound Properties Date First Assessed: 11/07/14 Location: Toe (Comment  which one) , right great toe  Location Orientation: Right Wound Description (Comments): small open hole under the right grest toe          [facility] Adult PT Treatment/Exercise - 09/04/15 0001    Manual Therapy   Manual  Therapy Other (comment)   Manual therapy comments donning/doffing of compression garments, general education.                PT Education - 09/04/15 1647    Education provided Yes   Education Details read assessment for all education provided.   Person(s) Educated Patient   Methods Explanation;Demonstration;Tactile cues;Verbal cues   Comprehension Verbalized understanding;Returned demonstration;Verbal cues required;Tactile cues required;Need further instruction          PT Short Term Goals - 08/16/15 1901    PT SHORT TERM GOAL #1   Title Pt to be able to verbalize the signs and symptoms of cellulitis and to be able to verbalize that she understands that people diagnosed with lymphedema hae an increase risk of cellulitis   Time 2   Period Weeks   Status Achieved   PT SHORT TERM GOAL #2   Title Pt pain to be no greater than a 4/10 throughout the day to allow pt to amblulate for at least 10 minutes at a time   Time 4   Period Weeks   Status Partially Met  PT SHORT TERM GOAL #3   Title Pt to volume to decrease by 20% for improved fitting of clothes    Time 4   Period Weeks   Status On-going   PT SHORT TERM GOAL #4   Title Pt feeling of heaviness to decrease so that pt is able to easily lift her legs up onto the bed or couch    Time 4   Period Weeks   Status On-going           PT Long Term Goals - 08/16/15 1902    PT LONG TERM GOAL #1   Title PT volume to decrease by 35% to allow pt to fit into her compression stockings.    Time 8   Period Weeks   Status Partially Met   PT LONG TERM GOAL #2   Title Pt pain to be no greater than a 2/10 to allow pt feel confident going to and participating in [place] dances    Time 8   Period Weeks   Status Partially Met   PT LONG TERM GOAL #3   Title Pt will be independent in donning/doffing compression garments for long term management of lymphedema.   Time 8   Period Weeks   Status On-going                Plan - 09/04/15 1651    Clinical Impression Statement Pt comes today in a generally better spirit than last session.  Compression garments remain intact without outward signs of irritation or skin breakdown from having them on an extended time.  counseled patient on the importance of taking them off everyday, wearing her night time garments and putting on a new, clean garment each day.  Pt states she is going to order the sock butler as she thinks she cannot get them on independently.  Spent session working on donning/doffing stockings.  Pt now able to doff independently in seated position but still  unable to don without use of AD.  Introduced sequential compression pump to patient and feel this would also be benficial for keeping her edema down.  Pt reported interest in this and forms faxed to company [facility]).  Instructed patient to bring in her reidsleeves next session to work on donning/doffing these form night time use.  Pt with noted satisfaction in ability to get stockings on/off this session and reports she will order her sock butler.    Rehab Potential Good   PT Frequency 2x / week   PT Duration 8 weeks   PT Treatment/Interventions Manual lymph drainage;Compression bandaging;Patient/family education   PT Next Visit Plan Pt to bring in night compression.  Follow up if able to order/purchase a sock butler. Continue with donning/doffing to increase independence and proficiency in wearing garments as instructed. Follow up with compression pump order.       Patient will benefit from skilled therapeutic intervention in order to improve the following deficits and impairments:  Pain, Difficulty walking, Decreased activity tolerance, Decreased balance, Increased edema  Visit Diagnosis: Lymphedema, not elsewhere classified  Muscle weakness (generalized)  Difficulty in walking, not elsewhere classified  Other abnormalities of gait and mobility     Problem List Patient Active Problem  List   Diagnosis Date Noted  . CAP (community acquired pneumonia) 07/25/2015  . Pressure ulcer 05/02/2015  . Hyponatremia 05/01/2015  . Acute-on-chronic kidney injury ([place]) 05/01/2015  . Diabetes mellitus with hyperglycemia, with long-term current use of insulin ([place]) 05/01/2015  . COPD exacerbation (  [place]) 04/30/2015  . Cellulitis of foot, right 03/24/2015  . Elevated d-dimer 03/24/2015  . Peripheral edema 12/04/2014  . Bilateral edema of lower extremity   . Diabetic ulcer of right great toe ([place])   . Foot ulcer due to secondary DM ([place]) 11/07/2014  . Diabetic ulcer of right foot ([place]) 11/07/2014  . Edema of lower extremity 05/27/2013  . Headache 05/26/2013  . CKD (chronic kidney disease), stage III 04/14/2013  . Leukocytosis 04/14/2013  . Anemia in CKD (chronic kidney disease) 04/14/2013  . Dyspnea 04/14/2013  . Chronic diastolic CHF (congestive heart failure) ([place])   . COPD (chronic obstructive pulmonary disease) ([place]) 08/07/2012  . Morbid obesity ([place]) 08/07/2012  . DM type 2 (diabetes mellitus, type 2) ([place]) 08/07/2012  . HTN (hypertension), benign 08/07/2012  . Hypothyroidism (acquired) 08/07/2012    Teena Irani, PTA/CLT [phone]  09/04/2015, 5:00 PM  [place] [address] [place], [name], [address] Phone: [phone]   Fax:  [phone]  Name: ANNISSA ANDREONI MRN: [id number] Date of Birth: Jun 17, 1950

## 2015-09-05 NOTE — Telephone Encounter (Signed)
Returned patient's phone call no answer but left message.   Rayetta Humphrey, [place] CLT [phone]

## 2015-09-06 ENCOUNTER — Ambulatory Visit (HOSPITAL_COMMUNITY): Payer: Medicare Other | Admitting: Physical Therapy

## 2015-09-06 DIAGNOSIS — R2689 Other abnormalities of gait and mobility: Secondary | ICD-10-CM | POA: Diagnosis not present

## 2015-09-06 DIAGNOSIS — M6281 Muscle weakness (generalized): Secondary | ICD-10-CM

## 2015-09-06 DIAGNOSIS — T148 Other injury of unspecified body region: Secondary | ICD-10-CM | POA: Diagnosis not present

## 2015-09-06 DIAGNOSIS — R29898 Other symptoms and signs involving the musculoskeletal system: Secondary | ICD-10-CM | POA: Diagnosis not present

## 2015-09-06 DIAGNOSIS — I890 Lymphedema, not elsewhere classified: Secondary | ICD-10-CM

## 2015-09-06 DIAGNOSIS — R262 Difficulty in walking, not elsewhere classified: Secondary | ICD-10-CM | POA: Diagnosis not present

## 2015-09-06 DIAGNOSIS — R269 Unspecified abnormalities of gait and mobility: Secondary | ICD-10-CM | POA: Diagnosis not present

## 2015-09-06 NOTE — Therapy (Signed)
[place], [name], [address] Phone: [phone]   Fax:  [phone]  Physical Therapy Treatment  Patient Details  Name: Stephanie Combs MRN: [id number] Date of Birth: 11-Sep-1950 Referring Provider: Stoney Bang  Encounter Date: 09/06/2015      PT End of Session - 09/06/15 1459    Visit Number 9   Number of Visits 16   Date for PT Re-Evaluation 09/05/15   Authorization Type UHC medicare    Authorization - Visit Number 9   Authorization - Number of Visits 10   PT Start Time 1400   PT Stop Time 1445   PT Time Calculation (min) 45 min   Activity Tolerance Patient tolerated treatment well   Behavior During Therapy [facility] for tasks assessed/performed      Past Medical History  Diagnosis Date  . Hypertension   . Diabetes mellitus without complication ([place])   . COPD (chronic obstructive pulmonary disease) ([place])   . CHF (congestive heart failure) ([facility])     Diastolic  . Anxiety   . Chronic back pain   . Hypothyroidism   . Pedal edema   . On home O2     2.5 L N/C prn  . Degenerative disk disease     Past Surgical History  Procedure Laterality Date  . Tubal ligation    . Hernia repair      There were no vitals filed for this visit.      Subjective Assessment - 09/06/15 1427    Subjective PT states her back hurts today and generally doesnt feel good.  States she is returning to her cardiologist tomorrow and is hoping for good news.  Pt did not bring new hose oor night time garments with her this session.  States he son in law has ordered her sock butler.    Currently in Pain? No/denies                       Wound Therapy    Wound Properties Date First Assessed: 11/07/14 Location: Toe (Comment  which one) , right great toe  Location Orientation: Right Wound Description (Comments): small open hole under the right grest toe                  PT Education - 09/06/15 1458    Education provided Yes   Education Details Education on lymphedema equipment and progression, see assessment   Person(s) Educated Patient   Methods Explanation   Comprehension Verbalized understanding          PT Short Term Goals - 08/16/15 1901    PT SHORT TERM GOAL #1   Title Pt to be able to verbalize the signs and symptoms of cellulitis and to be able to verbalize that she understands that people diagnosed with lymphedema hae an increase risk of cellulitis   Time 2   Period Weeks   Status Achieved   PT SHORT TERM GOAL #2   Title Pt pain to be no greater than a 4/10 throughout the day to allow pt to amblulate for at least 10 minutes at a time   Time 4   Period Weeks   Status Partially Met   PT SHORT TERM GOAL #3   Title Pt to volume to decrease by 20% for improved fitting of clothes    Time 4   Period Weeks   Status On-going   PT SHORT TERM GOAL #4  Title Pt feeling of heaviness to decrease so that pt is able to easily lift her legs up onto the bed or couch    Time 4   Period Weeks   Status On-going           PT Long Term Goals - 08/16/15 1902    PT LONG TERM GOAL #1   Title PT volume to decrease by 35% to allow pt to fit into her compression stockings.    Time 8   Period Weeks   Status Partially Met   PT LONG TERM GOAL #2   Title Pt pain to be no greater than a 2/10 to allow pt feel confident going to and participating in [place] dances    Time 8   Period Weeks   Status Partially Met   PT LONG TERM GOAL #3   Title Pt will be independent in donning/doffing compression garments for long term management of lymphedema.   Time 8   Period Weeks   Status On-going               Plan - 09/06/15 1459    Clinical Impression Statement Pt reported to therapy today wtih general malaise and complaints of not feeling well.  States she was going to call and cancel but did not want to be dismissed from therapy.  pt did not bring a clean daytime garment or her  nighttime garments to practice donning/dofficng.  States she did manage to remove her garments yesterday to take a shower and had her daughter put on a clean one afterward.  Pt reports her son in law is ordering her sock butlet.  Explained importance of bringing her garments next session as she has met all goals and this is the main purpose of continuing therapy.  Pt declined to try to don/doff her garments today as she reported her chest muscles have been hurting and she wants to go to her cardiologist first tomorrow.  Instructed patient ot have daughter come and change her garments daily and verbalized understanding.  Instructed to bring all garments and sock aid next session to therapy.   Rehab Potential Good   PT Frequency 2x / week   PT Duration 8 weeks   PT Treatment/Interventions Manual lymph drainage;Compression bandaging;Patient/family education   PT Next Visit Plan Pt to bring in night compression.  discharge when able to donn stocking independently with purchased sock butler.  Gcodes and reasssessment next session to determine continued skilled need.      Patient will benefit from skilled therapeutic intervention in order to improve the following deficits and impairments:  Pain, Difficulty walking, Decreased activity tolerance, Decreased balance, Increased edema  Visit Diagnosis: Lymphedema, not elsewhere classified  Muscle weakness (generalized)  Difficulty in walking, not elsewhere classified  Other abnormalities of gait and mobility     Problem List Patient Active Problem List   Diagnosis Date Noted  . CAP (community acquired pneumonia) 07/25/2015  . Pressure ulcer 05/02/2015  . Hyponatremia 05/01/2015  . Acute-on-chronic kidney injury ([place]) 05/01/2015  . Diabetes mellitus with hyperglycemia, with long-term current use of insulin ([place]) 05/01/2015  . COPD exacerbation ([place]) 04/30/2015  . Cellulitis of foot, right 03/24/2015  . Elevated d-dimer 03/24/2015  . Peripheral  edema 12/04/2014  . Bilateral edema of lower extremity   . Diabetic ulcer of right great toe ([place])   . Foot ulcer due to secondary DM ([place]) 11/07/2014  . Diabetic ulcer of right foot ([place]) 11/07/2014  . Edema of  lower extremity 05/27/2013  . Headache 05/26/2013  . CKD (chronic kidney disease), stage III 04/14/2013  . Leukocytosis 04/14/2013  . Anemia in CKD (chronic kidney disease) 04/14/2013  . Dyspnea 04/14/2013  . Chronic diastolic CHF (congestive heart failure) ([place])   . COPD (chronic obstructive pulmonary disease) ([place]) 08/07/2012  . Morbid obesity ([place]) 08/07/2012  . DM type 2 (diabetes mellitus, type 2) ([place]) 08/07/2012  . HTN (hypertension), benign 08/07/2012  . Hypothyroidism (acquired) 08/07/2012    Teena Irani, PTA/CLT [phone]  09/06/2015, 3:08 PM  [place], [name], [address] Phone: [phone]   Fax:  [phone]  Name: Stephanie Combs MRN: [id number] Date of Birth: 08-14-1950

## 2015-09-07 ENCOUNTER — Encounter: Payer: Self-pay | Admitting: Adult Health

## 2015-09-07 ENCOUNTER — Ambulatory Visit (INDEPENDENT_AMBULATORY_CARE_PROVIDER_SITE_OTHER): Payer: Medicare Other | Admitting: Adult Health

## 2015-09-07 VITALS — BP 132/68 | HR 85 | Ht 66.0 in | Wt 265.0 lb

## 2015-09-07 DIAGNOSIS — J42 Unspecified chronic bronchitis: Secondary | ICD-10-CM

## 2015-09-07 DIAGNOSIS — I10 Essential (primary) hypertension: Secondary | ICD-10-CM | POA: Diagnosis not present

## 2015-09-07 DIAGNOSIS — F418 Other specified anxiety disorders: Secondary | ICD-10-CM | POA: Diagnosis not present

## 2015-09-07 NOTE — Progress Notes (Signed)
Name: Stephanie Combs    DOB: 1951/01/31  Age: 65 y.o.  MR#: [id number]       PCP:  Neale Burly, MD      Insurance: Payor: [employer] MEDICARE / Plan: [facility] MEDICARE / Product Type: *No Product type* /   CC:   No chief complaint on file.   VS Filed Vitals:   09/07/15 1521  BP: 132/68  Pulse: 85  Height: '5\' 6"'$  (1.676 m)  Weight: 265 lb (120.203 kg)  SpO2: 90%    Weights Current Weight  09/07/15 265 lb (120.203 kg)  07/26/15 265 lb 14 oz (120.6 kg)  04/30/15 263 lb 14.3 oz (119.7 kg)    Blood Pressure  BP Readings from Last 3 Encounters:  09/07/15 132/68  07/27/15 132/62  05/02/15 117/48     Admit date:  (Not on file) Last encounter with RMR:  Visit date not found   Allergy Dilaudid; Actifed cold-allergy; Doxycycline; Other; Penicillins; Reglan; Valium; and Vistaril  Current Outpatient Prescriptions  Medication Sig Dispense Refill  . albuterol (PROAIR HFA) 108 (90 BASE) MCG/ACT inhaler Inhale 1 puff into the lungs every 4 (four) hours as needed. For shortness of breath (Patient taking differently: Inhale 2 puffs into the lungs every 4 (four) hours as needed. For shortness of breath) 1 Inhaler 1  . ALPRAZolam (XANAX) 1 MG tablet Take 1 tablet (1 mg total) by mouth 3 (three) times daily as needed for anxiety. 30 tablet 0  . aspirin EC 81 MG tablet Take 81 mg by mouth daily.    Marland Kitchen diltiazem (CARDIZEM CD) 240 MG 24 hr capsule Take 1 capsule (240 mg total) by mouth daily. 30 capsule 2  . empagliflozin (JARDIANCE) 25 MG TABS tablet Take 25 mg by mouth daily.    . furosemide (LASIX) 20 MG tablet Take 2 tablets (40 mg total) by mouth 2 (two) times daily. 180 tablet 1  . furosemide (LASIX) 40 MG tablet Take 1 tablet (40 mg total) by mouth daily. 30 tablet 0  . gabapentin (NEURONTIN) 400 MG capsule Take 3 capsules (1,200 mg total) by mouth 2 (two) times daily. 60 capsule 1  . HYDROcodone-homatropine (HYCODAN) 5-1.5 MG/5ML syrup Take 5 mLs by mouth every 6 (six) hours as needed  for cough. 120 mL 0  . insulin aspart (NOVOLOG) 100 UNIT/ML injection Inject 0-20 Units into the skin 3 (three) times daily with meals. Takes 20 Units in am and 20 Units at night.  Per sliding scale    . insulin aspart (NOVOLOG) 100 UNIT/ML injection Inject 12 Units into the skin 3 (three) times daily with meals. 10 mL 11  . insulin detemir (LEVEMIR) 100 UNIT/ML injection Inject 0.2 mLs (20 Units total) into the skin at bedtime. 10 mL 11  . levothyroxine (SYNTHROID, LEVOTHROID) 125 MCG tablet Take 2 tablets (250 mcg total) by mouth daily before breakfast. 30 tablet 2  . menthol-cetylpyridinium (CEPACOL) 3 MG lozenge Take 1 lozenge (3 mg total) by mouth as needed for sore throat. 100 tablet 0  . metFORMIN (GLUCOPHAGE) 500 MG tablet Take 500 mg by mouth 2 (two) times daily with a meal.    . nicotine (NICODERM CQ - DOSED IN MG/24 HOURS) 21 mg/24hr patch Place 1 patch (21 mg total) onto the skin daily. 28 patch 0  . nystatin (MYCOSTATIN/NYSTOP) 100000 UNIT/GM POWD Apply 1 g topically 2 (two) times daily.    . Omega-3 Fatty Acids (FISH OIL PO) Take 1 capsule by mouth 2 (two) times daily.     Marland Kitchen  ondansetron (ZOFRAN ODT) 8 MG disintegrating tablet Take 1 tablet (8 mg total) by mouth every 8 (eight) hours as needed for nausea or vomiting. 10 tablet 0  . oseltamivir (TAMIFLU) 30 MG capsule Take 1 capsule (30 mg total) by mouth 2 (two) times daily. 6 capsule 0  . oxyCODONE-acetaminophen (PERCOCET/ROXICET) 5-325 MG tablet Take 2 tablets by mouth every 6 (six) hours as needed for moderate pain. 10 tablet 0  . pantoprazole (PROTONIX) 40 MG tablet Take 1 tablet (40 mg total) by mouth 2 (two) times daily. 60 tablet 0  . potassium chloride (K-DUR) 10 MEQ tablet Take 20 mEq by mouth 2 (two) times daily.     . predniSONE (DELTASONE) 20 MG tablet Take 1.5 tablets (30 mg total) by mouth daily with breakfast. 5 tablet 0  . predniSONE (DELTASONE) 20 MG tablet Take 2 tablets (40 mg total) by mouth daily before breakfast. 5  tablet 0  . saccharomyces boulardii (FLORASTOR) 250 MG capsule Take 1 capsule (250 mg total) by mouth 2 (two) times daily. 30 capsule 0   No current facility-administered medications for this visit.    Discontinued Meds:   There are no discontinued medications.  Patient Active Problem List   Diagnosis Date Noted  . CAP (community acquired pneumonia) 07/25/2015  . Pressure ulcer 05/02/2015  . Hyponatremia 05/01/2015  . Acute-on-chronic kidney injury ([place]) 05/01/2015  . Diabetes mellitus with hyperglycemia, with long-term current use of insulin ([place]) 05/01/2015  . COPD exacerbation ([place]) 04/30/2015  . Cellulitis of foot, right 03/24/2015  . Elevated d-dimer 03/24/2015  . Peripheral edema 12/04/2014  . Bilateral edema of lower extremity   . Diabetic ulcer of right great toe ([place])   . Foot ulcer due to secondary DM ([place]) 11/07/2014  . Diabetic ulcer of right foot ([place]) 11/07/2014  . Edema of lower extremity 05/27/2013  . Headache 05/26/2013  . CKD (chronic kidney disease), stage III 04/14/2013  . Leukocytosis 04/14/2013  . Anemia in CKD (chronic kidney disease) 04/14/2013  . Dyspnea 04/14/2013  . Chronic diastolic CHF (congestive heart failure) ([place])   . COPD (chronic obstructive pulmonary disease) ([place]) 08/07/2012  . Morbid obesity ([place]) 08/07/2012  . DM type 2 (diabetes mellitus, type 2) ([place]) 08/07/2012  . HTN (hypertension), benign 08/07/2012  . Hypothyroidism (acquired) 08/07/2012    LABS    Component Value Date/Time   NA 132* 07/27/2015 0453   NA 138 07/26/2015 0436   NA 139 07/25/2015 0640   K 4.5 07/27/2015 0453   K 4.5 07/26/2015 0436   K 4.5 07/25/2015 0640   CL 96* 07/27/2015 0453   CL 101 07/26/2015 0436   CL 103 07/25/2015 0640   CO2 30 07/27/2015 0453   CO2 28 07/26/2015 0436   CO2 29 07/25/2015 0640   GLUCOSE 301* 07/27/2015 0453   GLUCOSE 307* 07/26/2015 0436   GLUCOSE 134* 07/25/2015 0640   BUN 47* 07/27/2015 0453   BUN 35* 07/26/2015 0436   BUN  26* 07/25/2015 0640   CREATININE 1.78* 07/27/2015 0453   CREATININE 1.53* 07/26/2015 0436   CREATININE 1.57* 07/25/2015 0640   CALCIUM 7.8* 07/27/2015 0453   CALCIUM 8.0* 07/26/2015 0436   CALCIUM 8.4* 07/25/2015 0640   GFRNONAA 29* 07/27/2015 0453   GFRNONAA 35* 07/26/2015 0436   GFRNONAA 34* 07/25/2015 0640   GFRAA 33* 07/27/2015 0453   GFRAA 40* 07/26/2015 0436   GFRAA 39* 07/25/2015 0640   CMP     Component Value Date/Time   NA 132* 07/27/2015  0453   K 4.5 07/27/2015 0453   CL 96* 07/27/2015 0453   CO2 30 07/27/2015 0453   GLUCOSE 301* 07/27/2015 0453   BUN 47* 07/27/2015 0453   CREATININE 1.78* 07/27/2015 0453   CALCIUM 7.8* 07/27/2015 0453   PROT 6.9 07/25/2015 0640   ALBUMIN 3.6 07/25/2015 0640   AST 17 07/25/2015 0640   ALT 10* 07/25/2015 0640   ALKPHOS 90 07/25/2015 0640   BILITOT 0.5 07/25/2015 0640   GFRNONAA 29* 07/27/2015 0453   GFRAA 33* 07/27/2015 0453       Component Value Date/Time   WBC 4.6 07/26/2015 0436   WBC 7.6 07/25/2015 0640   WBC 8.8 05/01/2015 0549   HGB 10.7* 07/26/2015 0436   HGB 11.6* 07/25/2015 0640   HGB 10.6* 05/01/2015 0549   HCT 35.4* 07/26/2015 0436   HCT 38.7 07/25/2015 0640   HCT 33.1* 05/01/2015 0549   MCV 86.8 07/26/2015 0436   MCV 85.8 07/25/2015 0640   MCV 81.1 05/01/2015 0549    Lipid Panel  No results found for: CHOL, TRIG, HDL, CHOLHDL, VLDL, LDLCALC, LDLDIRECT  ABG    Component Value Date/Time   TCO2 29 12/04/2014 0509     Lab Results  Component Value Date   TSH 0.117* 07/25/2015   BNP (last 3 results)  Recent Labs  12/04/14 0456 03/24/15 1538 04/30/15 0358  BNP 50.0 170.0* 37.0    ProBNP (last 3 results) No results for input(s): PROBNP in the last 8760 hours.  Cardiac Panel (last 3 results) No results for input(s): CKTOTAL, CKMB, TROPONINI, RELINDX in the last 72 hours.  Iron/TIBC/Ferritin/ %Sat    Component Value Date/Time   IRON <10* 04/14/2013 1315   TIBC Not calculated due to Iron <10.  04/14/2013 1315   FERRITIN 100 04/14/2013 1315   IRONPCTSAT Not calculated due to Iron <10. 04/14/2013 1315     EKG Orders placed or performed in visit on 09/07/15  . EKG 12-Lead     Prior Assessment and Plan Problem List as of 09/07/2015      Cardiovascular and Mediastinum   HTN (hypertension), benign   Chronic diastolic CHF (congestive heart failure) (HCC)     Respiratory   COPD (chronic obstructive pulmonary disease) (HCC)   COPD exacerbation (HCC)   CAP (community acquired pneumonia)     Endocrine   DM type 2 (diabetes mellitus, type 2) ([place])   Hypothyroidism (acquired)   Foot ulcer due to secondary DM ([place])   Diabetic ulcer of right foot ([place])   Diabetic ulcer of right great toe ([place])   Diabetes mellitus with hyperglycemia, with long-term current use of insulin ([place])     Musculoskeletal and Integument   Pressure ulcer     Genitourinary   CKD (chronic kidney disease), stage III   Anemia in CKD (chronic kidney disease)   Acute-on-chronic kidney injury ([place])     Other   Morbid obesity (HCC)   Leukocytosis   Dyspnea   Headache   Edema of lower extremity   Bilateral edema of lower extremity   Peripheral edema   Cellulitis of foot, right   Elevated d-dimer   Hyponatremia       Imaging: No results found.

## 2015-09-07 NOTE — Progress Notes (Signed)
Cardiology Office Note   Date:  09/07/2015   ID:  Stephanie Combs, DOB August 09, 1950, MRN [id number]  PCP:  Neale Burly, MD  Cardiologist: Branch/ Taylor/  Jory Sims, NP   Chief Complaint  Patient presents with  . Congestive Heart Failure  . Chest Pain      History of Present Illness: Stephanie Combs is a 65 y.o. female who presents for ongoing assessment and management of diastolic heart failure, hypertension, history of COPD, and chronic chest pain. She was last seen in the office in July 2016 at which time she was having a lot of anxiety. She was concerned mostly about her own health, and her life expectancy. She is also being followed by psychiatry as she has had severe anxiety related to deaths of family members.  On last office visit the patient was given a lot of time on reassurance and going over medications. No medication changes were made. She is advised to quit smoking, she was referred back to PCP and to continue psychiatric counseling.  She comes in with multiple somatic complaints. She denies any chest pain. She remains on oxygen via nasal cannula. She is stop smoking. She has no cardiac complaints. Her main complaint is sluggishness and feeling tired all the time.  Past Medical History  Diagnosis Date  . Hypertension   . Diabetes mellitus without complication ([place])   . COPD (chronic obstructive pulmonary disease) ([place])   . CHF (congestive heart failure) (HCC)     Diastolic  . Anxiety   . Chronic back pain   . Hypothyroidism   . Pedal edema   . On home O2     2.5 L N/C prn  . Degenerative disk disease     Past Surgical History  Procedure Laterality Date  . Tubal ligation    . Hernia repair       Current Outpatient Prescriptions  Medication Sig Dispense Refill  . albuterol (PROAIR HFA) 108 (90 BASE) MCG/ACT inhaler Inhale 1 puff into the lungs every 4 (four) hours as needed. For shortness of breath (Patient taking differently: Inhale 2 puffs into the  lungs every 4 (four) hours as needed. For shortness of breath) 1 Inhaler 1  . ALPRAZolam (XANAX) 1 MG tablet Take 1 tablet (1 mg total) by mouth 3 (three) times daily as needed for anxiety. 30 tablet 0  . aspirin EC 81 MG tablet Take 81 mg by mouth daily.    Marland Kitchen diltiazem (CARDIZEM CD) 240 MG 24 hr capsule Take 1 capsule (240 mg total) by mouth daily. 30 capsule 2  . empagliflozin (JARDIANCE) 25 MG TABS tablet Take 25 mg by mouth daily.    . furosemide (LASIX) 20 MG tablet Take 2 tablets (40 mg total) by mouth 2 (two) times daily. 180 tablet 1  . gabapentin (NEURONTIN) 400 MG capsule Take 3 capsules (1,200 mg total) by mouth 2 (two) times daily. 60 capsule 1  . HYDROcodone-homatropine (HYCODAN) 5-1.5 MG/5ML syrup Take 5 mLs by mouth every 6 (six) hours as needed for cough. 120 mL 0  . insulin aspart (NOVOLOG) 100 UNIT/ML injection Inject 0-20 Units into the skin 3 (three) times daily with meals. Takes 20 Units in am and 20 Units at night.  Per sliding scale    . insulin aspart (NOVOLOG) 100 UNIT/ML injection Inject 12 Units into the skin 3 (three) times daily with meals. 10 mL 11  . insulin detemir (LEVEMIR) 100 UNIT/ML injection Inject 0.2 mLs (20 Units total) into  the skin at bedtime. 10 mL 11  . levothyroxine (SYNTHROID, LEVOTHROID) 125 MCG tablet Take 2 tablets (250 mcg total) by mouth daily before breakfast. 30 tablet 2  . menthol-cetylpyridinium (CEPACOL) 3 MG lozenge Take 1 lozenge (3 mg total) by mouth as needed for sore throat. 100 tablet 0  . metFORMIN (GLUCOPHAGE) 500 MG tablet Take 500 mg by mouth 2 (two) times daily with a meal.    . nicotine (NICODERM CQ - DOSED IN MG/24 HOURS) 21 mg/24hr patch Place 1 patch (21 mg total) onto the skin daily. 28 patch 0  . nystatin (MYCOSTATIN/NYSTOP) 100000 UNIT/GM POWD Apply 1 g topically 2 (two) times daily.    . Omega-3 Fatty Acids (FISH OIL PO) Take 1 capsule by mouth 2 (two) times daily.     . ondansetron (ZOFRAN ODT) 8 MG disintegrating tablet  Take 1 tablet (8 mg total) by mouth every 8 (eight) hours as needed for nausea or vomiting. 10 tablet 0  . oxyCODONE-acetaminophen (PERCOCET/ROXICET) 5-325 MG tablet Take 2 tablets by mouth every 6 (six) hours as needed for moderate pain. 10 tablet 0  . pantoprazole (PROTONIX) 40 MG tablet Take 1 tablet (40 mg total) by mouth 2 (two) times daily. 60 tablet 0  . potassium chloride (K-DUR) 10 MEQ tablet Take 20 mEq by mouth 2 (two) times daily.     Marland Kitchen saccharomyces boulardii (FLORASTOR) 250 MG capsule Take 1 capsule (250 mg total) by mouth 2 (two) times daily. 30 capsule 0   No current facility-administered medications for this visit.    Allergies:   Dilaudid; Actifed cold-allergy; Doxycycline; Other; Penicillins; Reglan; Valium; and Vistaril    Social History:  The patient  reports that she quit smoking about 2 months ago. Her smoking use included Cigarettes. She smoked 0.25 packs per day. She has never used smokeless tobacco. She reports that she does not drink alcohol or use illicit drugs.   Family History:  The patient's family history includes Cerebral palsy in her brother; Diabetes in her brother and other; Heart attack in her father and other; Pneumonia in her brother; Stroke in her mother.    ROS: All other systems are reviewed and negative. Unless otherwise mentioned in H&P    PHYSICAL EXAM: VS:  BP 132/68 mmHg  Pulse 85  Ht '5\' 6"'$  (1.676 m)  Wt 265 lb (120.203 kg)  BMI 42.79 kg/m2  SpO2 90% , BMI Body mass index is 42.79 kg/(m^2). GEN: Well nourished, well developed, in no acute distress HEENT: normal Neck: no JVD, carotid bruits, or masses Cardiac: RRR; no murmurs, rubs, or gallops,no edema  Respiratory:  clear to auscultation bilaterally, normal work of breathing. O2 2 L via nasal cannula GI: soft, nontender, nondistended, + BS MS: no deformity or atrophyuses walker for ambulation. She also has knee-high TED hose on. Skin: warm and dry, no rash Neuro:  Strength and  sensation are intact Psych: euthymic mood, full affect  Recent Labs: 04/30/2015: B Natriuretic Peptide 37.0 07/25/2015: ALT 10*; TSH 0.117* 07/26/2015: Hemoglobin 10.7*; Platelets 232 07/27/2015: BUN 47*; Creatinine, Ser 1.78*; Potassium 4.5; Sodium 132*    Lipid Panel No results found for: CHOL, TRIG, HDL, CHOLHDL, VLDL, LDLCALC, LDLDIRECT    Wt Readings from Last 3 Encounters:  09/07/15 265 lb (120.203 kg)  07/26/15 265 lb 14 oz (120.6 kg)  04/30/15 263 lb 14.3 oz (119.7 kg)      ASSESSMENT AND PLAN:  1. Hypertension: She remains on diltiazem, Lasix, and potassium replacement. She did have  transient atrial fibrillation while she was in the hospital with pneumonia. This is since return to normal sinus rhythm. No changes in her medication regimen at this time.  2. Oxygen-dependent COPD:she remains on oxygen. She wishes to stop it. I've advised her to follow up with primary care. Her primary care physician has placed her on the oxygen.  3. Hypertension:blood pressure is fairly controlled. No changes in her medication regimen.  4.  Anxiety over her health issues:this is very prominent on this office visit. She talks at length about her health issues and chronic complaints. She may need to be placed on antidepressant therapy but will defer to primary care physician.  Current medicines are reviewed at length with the patient today.    Labs/ tests ordered today include:   Orders Placed This Encounter  Procedures  . EKG 12-Lead     Disposition:   FU with 6 months Signed, Jory Sims, NP  09/07/2015 4:07 PM    [facility] [address], [place], [place] [address] Phone: [phone]; Fax: [phone]

## 2015-09-07 NOTE — Patient Instructions (Addendum)
Your physician wants you to follow-up in: 6 Months.  You will receive a reminder letter in the mail two months in advance. If you don't receive a letter, please call our office to schedule the follow-up appointment.  Your physician recommends that you continue on your current medications as directed. Please refer to the Current Medication list given to you today.  If you need a refill on your cardiac medications before your next appointment, please call your pharmacy.  Thank you for choosing [facility]!   

## 2015-09-10 ENCOUNTER — Ambulatory Visit (HOSPITAL_COMMUNITY): Payer: Medicare Other | Admitting: Physical Therapy

## 2015-09-10 ENCOUNTER — Telehealth (HOSPITAL_COMMUNITY): Payer: Self-pay

## 2015-09-10 NOTE — Telephone Encounter (Signed)
09/10/15 called to  Move appts but patient said to just cx this week and she would come back next week

## 2015-09-13 ENCOUNTER — Telehealth (HOSPITAL_COMMUNITY): Payer: Self-pay | Admitting: Physical Therapy

## 2015-09-13 NOTE — Telephone Encounter (Signed)
Pt called with concerns regarding her compression stockings. Stated she didn't think they measured her right and they are hurting in her anterior ankle creases.  Asked patient about wear time and initially told me that she has had them off only once since her last visit here last week.  When I explained to her, again about being imperative that she remove them every afternoon, NOT TO SLEEP IN THEM, she then stated that she is taking them off every night.  Patient requested to be seen today, however no appt time was available.  Pt had cancelled all her appointments for this week on Monday and when questioned why she cancelled them she stated she didn't that we cancelled them.  Secretary attempted to move one of her appointments this week and when called patient requested to go ahead and cancel all her appointments this week as her LE's were doing good and she was independent with donning/doffing her garments.  Pt then expressed that no one had taught her how to wear her night time garments.  Reminded Ms Sarracino that we have requested her to bring them to every appointment since she got them and she has failed to do so.  Instructed patient to go ahead and remove her garments, moisturize and leave them off until tomorrow morning.  Instructed to bring her night time garment in next session.  Pt verbally agreed to do this.   Teena Irani, PTA/CLT [phone]

## 2015-09-14 ENCOUNTER — Encounter (HOSPITAL_COMMUNITY): Payer: Medicare Other | Admitting: Physical Therapy

## 2015-09-14 DIAGNOSIS — I5032 Chronic diastolic (congestive) heart failure: Secondary | ICD-10-CM | POA: Diagnosis not present

## 2015-09-14 DIAGNOSIS — Z1389 Encounter for screening for other disorder: Secondary | ICD-10-CM | POA: Diagnosis not present

## 2015-09-14 DIAGNOSIS — Z0000 Encounter for general adult medical examination without abnormal findings: Secondary | ICD-10-CM | POA: Diagnosis not present

## 2015-09-14 DIAGNOSIS — E1141 Type 2 diabetes mellitus with diabetic mononeuropathy: Secondary | ICD-10-CM | POA: Diagnosis not present

## 2015-09-14 DIAGNOSIS — J440 Chronic obstructive pulmonary disease with acute lower respiratory infection: Secondary | ICD-10-CM | POA: Diagnosis not present

## 2015-09-17 ENCOUNTER — Ambulatory Visit (HOSPITAL_COMMUNITY): Payer: Medicare Other | Attending: Internal Medicine | Admitting: Physical Therapy

## 2015-09-17 DIAGNOSIS — I890 Lymphedema, not elsewhere classified: Secondary | ICD-10-CM | POA: Diagnosis not present

## 2015-09-17 DIAGNOSIS — R262 Difficulty in walking, not elsewhere classified: Secondary | ICD-10-CM | POA: Insufficient documentation

## 2015-09-17 DIAGNOSIS — M6281 Muscle weakness (generalized): Secondary | ICD-10-CM | POA: Diagnosis present

## 2015-09-17 NOTE — Therapy (Addendum)
[place], [name], [address] Phone: [phone]   Fax:  [phone]  Physical Therapy Treatment  Patient Details  Name: Stephanie Combs MRN: [id number] Date of Birth: Mar 12, 1951 Referring Provider: Stoney Bang  Encounter Date: 09/17/2015      PT End of Session - 09/17/15 1336    Visit Number 10   Number of Visits 16   Date for PT Re-Evaluation 10/13/15   Authorization Type [employer] medicare    Authorization - Visit Number 10   Authorization - Number of Visits 20   PT Start Time 7858   PT Stop Time 1430   PT Time Calculation (min) 82 min   Activity Tolerance Patient tolerated treatment well   Behavior During Therapy [facility] for tasks assessed/performed      Past Medical History  Diagnosis Date  . Hypertension   . Diabetes mellitus without complication ([place])   . COPD (chronic obstructive pulmonary disease) ([place])   . CHF (congestive heart failure) ([facility])     Diastolic  . Anxiety   . Chronic back pain   . Hypothyroidism   . Pedal edema   . On home O2     2.5 L N/C prn  . Degenerative disk disease     Past Surgical History  Procedure Laterality Date  . Tubal ligation    . Hernia repair      There were no vitals filed for this visit.      Subjective Assessment - 09/17/15 1307    Subjective [name] states that she was able to get her old socking on and off with her buttress but she can not put her new ones on therefore she been keeping her stockings on overnight.  She has her Reidsleeve but has not been using them.  Pt is to get her compresion pumps next week.   Pertinent History [name] was admitted into [facility] on 3/15 for acute COPD.  She was discharged on 07/27/2015.  She was then admitted into [facility] on 07/31/2015 with congestive heart failure and discharged on 08/03/2015. She comes back to outpatient therapy seeking treatment of her lymphedma which has exacerbated due to the patient not  having any compression on her LE since 3/17.  She states that she did recieve her compression garment but by the time she recieved them her legs had swollen so much that they no longer fit her.      How long can you sit comfortably? no problem   How long can you stand comfortably? less than 2 mintues    How long can you walk comfortably? less than 2 minutes    Currently in Pain? Yes   Pain Score 10-Worst pain ever   Pain Location Ankle   Pain Orientation Right;Left   Pain Descriptors / Indicators Burning   Pain Type Acute pain   Pain Onset In the past 7 days   Pain Frequency Constant   Aggravating Factors  compression hose                        Wound Therapy    Wound Properties Date First Assessed: 11/07/14 Location: Toe (Comment  which one) , right great toe  Location Orientation: Right Wound Description (Comments): small open hole under the right grest toe          [facility] Adult PT Treatment/Exercise - 09/17/15 0001    Self-Care   Self-Care Other Self-Care Comments  Other Self-Care Comments  donnning/doffing Reidsleeve and compression dressing    Manual Therapy   Manual Therapy Compression Bandaging   Compression Bandaging Profore dressing used due to ecchymosis on anterior aspect of ankle.         08/06/2015 08/06/2015 09/17/2015 09/17/2015  right Left  RT  LT   29.7 48.3 26.7 25.6  31.50 31.30 27.40 26.30  38.00 35.20 29.50 29.30  45.00 41.70 34.70 34.80  49.70 48.00 41.00 40.80  51.80 52.10 44.70 42.90  53.00 53.70 47.70 46.10  52.90 51.90 53.30 53.20                                                         16104.08 97026.37 12333.26 11877.08  5126.0897 [id number] 3925.8 [id number]              PT Education - 09/17/15 1335    Education provided Yes   Education Details Once again stressed to patient that she is not suppose to wear her compression stockings at night. She must take the stockings off and place the Reidsleeve on     Person(s) Educated Patient   Methods Explanation   Comprehension Verbalized understanding          PT Short Term Goals - 09/17/15 1654    PT SHORT TERM GOAL #1   Title Pt to be able to verbalize the signs and symptoms of cellulitis and to be able to verbalize that she understands that people diagnosed with lymphedema hae an increase risk of cellulitis   Time 2   Period Weeks   Status Achieved   PT SHORT TERM GOAL #2   Title Pt pain to be no greater than a 4/10 throughout the day to allow pt to amblulate for at least 10 minutes at a time   Time 4   Status Partially Met  was met until new skin breakdown    PT SHORT TERM GOAL #3   Title Pt to volume to decrease by 20% for improved fitting of clothes    Time 4   Period Weeks   Status Achieved   PT SHORT TERM GOAL #4   Title Pt feeling of heaviness to decrease so that pt is able to easily lift her legs up onto the bed or couch    Time 4   Period Weeks   Status Achieved           PT Long Term Goals - 09/17/15 1655    PT LONG TERM GOAL #1   Title PT volume to decrease by 35% to allow pt to fit into her compression stockings.    Baseline able to fit into garment but wore them incorrectly so now there is skin breakdown.    Time 8   Period Weeks   Status Achieved   PT LONG TERM GOAL #2   Title Pt pain to be no greater than a 2/10 to allow pt feel confident going to and participating in [place] dances    Time 8   Status Partially Met   PT LONG TERM GOAL #3   Title Pt will be independent in donning/doffing compression garments for long term management of lymphedema.   Time 8   Period Weeks   Status On-going  Plan - 13-Oct-2015 1650    Clinical Impression Statement Since initial evaluation pt has lost 3,771 cc on the Rt and 50009 cc on the left.  Pt comes to department with her Reidsleeve as well as her Buttress.  Pt has not taken her compression stockings off and is sleeping in them despite therapist  telling her not to. She has skin breakdown on the anterior aspect of both LE.  Pt was edcuated on how to put Reidsleeve on which took the majority of the session.  Pt had an application of profore to both LE and will continue until skin breakdown is healed.     Rehab Potential Good   PT Frequency 2x / week   PT Duration 8 weeks   PT Next Visit Plan Pt Reidsleeve and buttress is in Lymph room.  Pt will need continued education on donning/ doffing compression garment and reidsleeve once this is complete pt may be discharged   Please have pt complete life impact form.     Consulted and Agree with Plan of Care Patient      Patient will benefit from skilled therapeutic intervention in order to improve the following deficits and impairments:  Pain, Difficulty walking, Decreased activity tolerance, Decreased balance, Increased edema  Visit Diagnosis: Lymphedema, not elsewhere classified  Muscle weakness (generalized)  Difficulty in walking, not elsewhere classified       G-Codes - 2015/10/13 1656    Functional Assessment Tool Used clinical judgement   Functional Limitation Other PT primary   Other PT Primary Current Status (C1448) At least 80 percent but less than 100 percent impaired, limited or restricted   Other PT Primary Goal Status (J8563) At least 40 percent but less than 60 percent impaired, limited or restricted      Problem List Patient Active Problem List   Diagnosis Date Noted  . CAP (community acquired pneumonia) 07/25/2015  . Pressure ulcer 05/02/2015  . Hyponatremia 05/01/2015  . Acute-on-chronic kidney injury ([place]) 05/01/2015  . Diabetes mellitus with hyperglycemia, with long-term current use of insulin ([place]) 05/01/2015  . COPD exacerbation ([place]) 04/30/2015  . Cellulitis of foot, right 03/24/2015  . Elevated d-dimer 03/24/2015  . Peripheral edema 12/04/2014  . Bilateral edema of lower extremity   . Diabetic ulcer of right great toe ([place])   . Foot ulcer due to  secondary DM ([place]) 11/07/2014  . Diabetic ulcer of right foot ([place]) 11/07/2014  . Edema of lower extremity 05/27/2013  . Headache 05/26/2013  . CKD (chronic kidney disease), stage III 04/14/2013  . Leukocytosis 04/14/2013  . Anemia in CKD (chronic kidney disease) 04/14/2013  . Dyspnea 04/14/2013  . Chronic diastolic CHF (congestive heart failure) ([place])   . COPD (chronic obstructive pulmonary disease) ([place]) 08/07/2012  . Morbid obesity ([place]) 08/07/2012  . DM type 2 (diabetes mellitus, type 2) ([place]) 08/07/2012  . HTN (hypertension), benign 08/07/2012  . Hypothyroidism (acquired) 08/07/2012    Rayetta Humphrey, PT CLT [phone] 2015/10/13, 4:58 PM  [facility] [address] [place], [name], [address] Phone: [phone]   Fax:  [phone]  Name: LACRETIA TINDALL MRN: [id number] Date of Birth: 09/19/1950

## 2015-09-21 ENCOUNTER — Ambulatory Visit (HOSPITAL_COMMUNITY): Payer: Medicare Other | Admitting: Physical Therapy

## 2015-09-21 DIAGNOSIS — M6281 Muscle weakness (generalized): Secondary | ICD-10-CM | POA: Diagnosis not present

## 2015-09-21 DIAGNOSIS — R262 Difficulty in walking, not elsewhere classified: Secondary | ICD-10-CM | POA: Diagnosis not present

## 2015-09-21 DIAGNOSIS — I890 Lymphedema, not elsewhere classified: Secondary | ICD-10-CM

## 2015-09-21 NOTE — Therapy (Signed)
[place], [name], [address] Phone: [phone]   Fax:  [phone]  Physical Therapy Treatment  Patient Details  Name: Stephanie Combs MRN: [id number] Date of Birth: [birth year] Referring Provider: Stoney Bang  Encounter Date: 09/21/2015      PT End of Session - 09/21/15 1335    Visit Number 11   Number of Visits 16   Date for PT Re-Evaluation 10/13/15   Authorization - Visit Number 11   Authorization - Number of Visits 20   PT Start Time 3295   PT Stop Time 1430   PT Time Calculation (min) 82 min   Activity Tolerance Patient tolerated treatment well      Past Medical History  Diagnosis Date  . Hypertension   . Diabetes mellitus without complication ([place])   . COPD (chronic obstructive pulmonary disease) ([place])   . CHF (congestive heart failure) ([facility])     Diastolic  . Anxiety   . Chronic back pain   . Hypothyroidism   . Pedal edema   . On home O2     2.5 L N/C prn  . Degenerative disk disease     Past Surgical History  Procedure Laterality Date  . Tubal ligation    . Hernia repair      There were no vitals filed for this visit.      Subjective Assessment - 09/21/15 1330    Subjective Pt states that she called [facility] and she is to leave her compression garments here. (Garments need to be longer.).     Pertinent History Ms. Widmayer was admitted into [facility] on 3/15 for acute COPD.  She was discharged on 07/27/2015.  She was then admitted into [facility] on 07/31/2015 with congestive heart failure and discharged on 08/03/2015. She comes back to outpatient therapy seeking treatment of her lymphedma which has exacerbated due to the patient not having any compression on her LE since 3/17.  She states that she did recieve her compression garment but by the time she recieved them her legs had swollen so much that they no longer fit her.      Currently in Pain? Yes   Pain Score 9    Pain Location  Toe (Comment which one) all   Pain Orientation Right;Left   Pain Descriptors / Indicators Aching;Throbbing   Pain Onset More than a month ago   Pain Frequency Constant   Aggravating Factors  weight bearing   Pain Relieving Factors toe bandages                        Wound Therapy    Wound Properties Date First Assessed: 11/07/14 Location: Toe (Comment  which one) , right great toe  Location Orientation: Right Wound Description (Comments): small open hole under the right grest toe          [facility] Adult PT Treatment/Exercise - 09/21/15 0001    Self-Care   Self-Care Other Self-Care Comments   Other Self-Care Comments  donnning/doffing Reidsleeve and compression dressing    Manual Therapy   Manual Therapy Toe bandaging to B feet followed by Compression Bandaging using multilayer profore.   Manual therapy comments Compression circles to toes and forefoot of bilateral LE. Marland Kitchen   Compression Bandaging Skin irritation on anterior aspect of both LE are much better; anticipate full healing by next visit.  PT Short Term Goals - 09/17/15 1654    PT SHORT TERM GOAL #1   Title Pt to be able to verbalize the signs and symptoms of cellulitis and to be able to verbalize that she understands that people diagnosed with lymphedema hae an increase risk of cellulitis   Time 2   Period Weeks   Status Achieved   PT SHORT TERM GOAL #2   Title Pt pain to be no greater than a 4/10 throughout the day to allow pt to amblulate for at least 10 minutes at a time   Time 4   Status Partially Met  was met until new skin breakdown    PT SHORT TERM GOAL #3   Title Pt to volume to decrease by 20% for improved fitting of clothes    Time 4   Period Weeks   Status Achieved   PT SHORT TERM GOAL #4   Title Pt feeling of heaviness to decrease so that pt is able to easily lift her legs up onto the bed or couch    Time 4   Period Weeks   Status Achieved           PT  Long Term Goals - 09/17/15 1655    PT LONG TERM GOAL #1   Title PT volume to decrease by 35% to allow pt to fit into her compression stockings.    Baseline able to fit into garment but wore them incorrectly so now there is skin breakdown.    Time 8   Period Weeks   Status Achieved   PT LONG TERM GOAL #2   Title Pt pain to be no greater than a 2/10 to allow pt feel confident going to and participating in [place] dances    Time 8   Status Partially Met   PT LONG TERM GOAL #3   Title Pt will be independent in donning/doffing compression garments for long term management of lymphedema.   Time 8   Period Weeks   Status On-going               Plan - 09/21/15 1336    Clinical Impression Statement Pt becomes SOB with mimimal exertion.  Pt able to don Reidsleeve with incrased ease but stilll needs verbal cuing and multiple rest breaks.  Dorsal aspect of B LE 90% healed.  Pt had difficulty puttiing compression garment on with her own buttress (smaller than cliics).  but was able to don garment using departments buttress    PT Frequency 2x / week   PT Duration 8 weeks   PT Next Visit Plan Assess skin breakdown; Continue donning and doffing compression garment and buttress       Patient will benefit from skilled therapeutic intervention in order to improve the following deficits and impairments:  Pain, Difficulty walking, Decreased activity tolerance, Decreased balance, Increased edema  Visit Diagnosis: Lymphedema, not elsewhere classified  Muscle weakness (generalized)  Difficulty in walking, not elsewhere classified     Problem List Patient Active Problem List   Diagnosis Date Noted  . CAP (community acquired pneumonia) 07/25/2015  . Pressure ulcer 05/02/2015  . Hyponatremia 05/01/2015  . Acute-on-chronic kidney injury ([place]) 05/01/2015  . Diabetes mellitus with hyperglycemia, with long-term current use of insulin ([place]) 05/01/2015  . COPD exacerbation ([place]) 04/30/2015  .  Cellulitis of foot, right 03/24/2015  . Elevated d-dimer 03/24/2015  . Peripheral edema 12/04/2014  . Bilateral edema of lower extremity   . Diabetic ulcer of right  great toe ([place])   . Foot ulcer due to secondary DM ([place]) 11/07/2014  . Diabetic ulcer of right foot ([place]) 11/07/2014  . Edema of lower extremity 05/27/2013  . Headache 05/26/2013  . CKD (chronic kidney disease), stage III 04/14/2013  . Leukocytosis 04/14/2013  . Anemia in CKD (chronic kidney disease) 04/14/2013  . Dyspnea 04/14/2013  . Chronic diastolic CHF (congestive heart failure) ([place])   . COPD (chronic obstructive pulmonary disease) ([place]) 08/07/2012  . Morbid obesity ([place]) 08/07/2012  . DM type 2 (diabetes mellitus, type 2) ([place]) 08/07/2012  . HTN (hypertension), benign 08/07/2012  . Hypothyroidism (acquired) 08/07/2012   Rayetta Humphrey, PT CLT [phone] 09/21/2015, 3:12 PM  [facility] [address] [place], [name], [address] Phone: [phone]   Fax:  [phone]  Name: Stephanie Combs MRN: [id number] Date of Birth: [birth year]

## 2015-09-24 ENCOUNTER — Telehealth (HOSPITAL_COMMUNITY): Payer: Self-pay

## 2015-09-24 ENCOUNTER — Encounter (HOSPITAL_COMMUNITY): Payer: Medicare Other | Admitting: Physical Therapy

## 2015-09-24 NOTE — Telephone Encounter (Signed)
09/24/15 left a message to cx today she has a stomach virus

## 2015-09-27 ENCOUNTER — Ambulatory Visit (HOSPITAL_COMMUNITY): Payer: Medicare Other | Admitting: Physical Therapy

## 2015-09-27 DIAGNOSIS — M6281 Muscle weakness (generalized): Secondary | ICD-10-CM

## 2015-09-27 DIAGNOSIS — R262 Difficulty in walking, not elsewhere classified: Secondary | ICD-10-CM | POA: Diagnosis not present

## 2015-09-27 DIAGNOSIS — I890 Lymphedema, not elsewhere classified: Secondary | ICD-10-CM

## 2015-09-27 NOTE — Therapy (Signed)
[place], [name], [address] Phone: [phone]   Fax:  [phone]  Physical Therapy Treatment  Patient Details  Name: Stephanie Combs MRN: [id number] Date of Birth: 1950-11-08 Referring Provider: Stoney Bang  Encounter Date: 09/27/2015      PT End of Session - 09/27/15 1425    Visit Number 12   Number of Visits 16   Date for PT Re-Evaluation 10/13/15   Authorization Type [place] - Visit Number 12   Authorization - Number of Visits 16   PT Start Time 8315   PT Stop Time 1428   PT Time Calculation (min) 83 min   Activity Tolerance Patient tolerated treatment well      Past Medical History  Diagnosis Date  . Hypertension   . Diabetes mellitus without complication ([place])   . COPD (chronic obstructive pulmonary disease) ([place])   . CHF (congestive heart failure) ([facility])     Diastolic  . Anxiety   . Chronic back pain   . Hypothyroidism   . Pedal edema   . On home O2     2.5 L N/C prn  . Degenerative disk disease     Past Surgical History  Procedure Laterality Date  . Tubal ligation    . Hernia repair      There were no vitals filed for this visit.      Subjective Assessment - 09/27/15 1419    Subjective Pt states that her legs and feet feel good today.    Pertinent History [name] was admitted into [facility] on 3/15 for acute COPD.  She was discharged on 07/27/2015.  She was then admitted into [facility] on 07/31/2015 with congestive heart failure and discharged on 08/03/2015. She comes back to outpatient therapy seeking treatment of her lymphedma which has exacerbated due to the patient not having any compression on her LE since 3/17.  She states that she did recieve her compression garment but by the time she recieved them her legs had swollen so much that they no longer fit her.      How long can you sit comfortably? no problem   How long can you stand comfortably? Able to  stand for 5 minutes    How long can you walk comfortably? if pt has her walker she can walk for ten minutes.    Currently in Pain? Yes   Pain Score 3    Pain Location Ankle   Pain Descriptors / Indicators Aching   Pain Radiating Towards no radiation    Pain Frequency Constant   Aggravating Factors  walking                        Wound Therapy    Wound Properties Date First Assessed: 11/07/14 Location: Toe (Comment  which one) , right great toe  Location Orientation: Right Wound Description (Comments): small open hole under the right grest toe          [facility] Adult PT Treatment/Exercise - 09/27/15 0001    Self-Care   Self-Care Other Self-Care Comments   Other Self-Care Comments  donnning/doffing Reidsleeve and compression garment    Manual Therapy   Manual Therapy Manual Lymphatic Drainage (MLD)   Manual therapy comments donning/doffing of compression garments, general education.   Manual Lymphatic Drainage (MLD) toes and forefoot bilaterally  PT Education - 09/27/15 1424    Education provided Yes   Education Details Not to sleep in compression garment.  Put reidsleeve on to sleep in. Compression garmentsfor day time.            PT Short Term Goals - 09/17/15 1654    PT SHORT TERM GOAL #1   Title Pt to be able to verbalize the signs and symptoms of cellulitis and to be able to verbalize that she understands that people diagnosed with lymphedema hae an increase risk of cellulitis   Time 2   Period Weeks   Status Achieved   PT SHORT TERM GOAL #2   Title Pt pain to be no greater than a 4/10 throughout the day to allow pt to amblulate for at least 10 minutes at a time   Time 4   Status Partially Met  was met until new skin breakdown    PT SHORT TERM GOAL #3   Title Pt to volume to decrease by 20% for improved fitting of clothes    Time 4   Period Weeks   Status Achieved   PT SHORT TERM GOAL #4   Title Pt feeling of heaviness to  decrease so that pt is able to easily lift her legs up onto the bed or couch    Time 4   Period Weeks   Status Achieved           PT Long Term Goals - 09/17/15 1655    PT LONG TERM GOAL #1   Title PT volume to decrease by 35% to allow pt to fit into her compression stockings.    Baseline able to fit into garment but wore them incorrectly so now there is skin breakdown.    Time 8   Period Weeks   Status Achieved   PT LONG TERM GOAL #2   Title Pt pain to be no greater than a 2/10 to allow pt feel confident going to and participating in [place] dances    Time 8   Status Partially Met   PT LONG TERM GOAL #3   Title Pt will be independent in donning/doffing compression garments for long term management of lymphedema.   Time 8   Period Weeks   Status On-going               Plan - 09/27/15 1425    Clinical Impression Statement Pt states that she has not recieved her compression pump.  She recieved a call last week about her compression pump but has not recieved it at this time.  Pt is able to don garment on with increased ease will have patient practice donning and doffing reidsleeve and compression garent at home.    Rehab Potential Good   PT Frequency 2x / week   PT Duration 8 weeks   PT Treatment/Interventions Manual lymph drainage;Compression bandaging;Patient/family education   PT Next Visit Plan Check on compression pump if pt has not recieved it.  COntinue practice donning and doffing garment as well as reidsleeve.       Patient will benefit from skilled therapeutic intervention in order to improve the following deficits and impairments:  Pain, Difficulty walking, Decreased activity tolerance, Decreased balance, Increased edema  Visit Diagnosis: Lymphedema, not elsewhere classified  Muscle weakness (generalized)  Difficulty in walking, not elsewhere classified     Problem List Patient Active Problem List   Diagnosis Date Noted  . CAP (community acquired  pneumonia) 07/25/2015  . Pressure ulcer  05/02/2015  . Hyponatremia 05/01/2015  . Acute-on-chronic kidney injury ([place]) 05/01/2015  . Diabetes mellitus with hyperglycemia, with long-term current use of insulin ([place]) 05/01/2015  . COPD exacerbation ([place]) 04/30/2015  . Cellulitis of foot, right 03/24/2015  . Elevated d-dimer 03/24/2015  . Peripheral edema 12/04/2014  . Bilateral edema of lower extremity   . Diabetic ulcer of right great toe ([place])   . Foot ulcer due to secondary DM ([place]) 11/07/2014  . Diabetic ulcer of right foot ([place]) 11/07/2014  . Edema of lower extremity 05/27/2013  . Headache 05/26/2013  . CKD (chronic kidney disease), stage III 04/14/2013  . Leukocytosis 04/14/2013  . Anemia in CKD (chronic kidney disease) 04/14/2013  . Dyspnea 04/14/2013  . Chronic diastolic CHF (congestive heart failure) ([place])   . COPD (chronic obstructive pulmonary disease) ([place]) 08/07/2012  . Morbid obesity ([place]) 08/07/2012  . DM type 2 (diabetes mellitus, type 2) ([place]) 08/07/2012  . HTN (hypertension), benign 08/07/2012  . Hypothyroidism (acquired) 08/07/2012   Rayetta Humphrey, PT CLT [phone] 09/27/2015, 2:31 PM  [facility] [address] [place], [name], [address] Phone: [phone]   Fax:  [phone]  Name: FORREST DEMURO MRN: [id number] Date of Birth: 23-Nov-1950

## 2015-10-01 ENCOUNTER — Ambulatory Visit (HOSPITAL_COMMUNITY): Payer: Medicare Other | Admitting: Physical Therapy

## 2015-10-01 DIAGNOSIS — M6281 Muscle weakness (generalized): Secondary | ICD-10-CM

## 2015-10-01 DIAGNOSIS — J449 Chronic obstructive pulmonary disease, unspecified: Secondary | ICD-10-CM | POA: Diagnosis not present

## 2015-10-01 DIAGNOSIS — I890 Lymphedema, not elsewhere classified: Secondary | ICD-10-CM | POA: Diagnosis not present

## 2015-10-01 DIAGNOSIS — R262 Difficulty in walking, not elsewhere classified: Secondary | ICD-10-CM

## 2015-10-01 NOTE — Therapy (Signed)
[place], [name], [address] Phone: [phone]   Fax:  [phone]  Physical Therapy Treatment  Patient Details  Name: Stephanie Combs MRN: [id number] Date of Birth: [birth year] Referring Provider: Stoney Bang  Encounter Date: 10/01/2015      PT End of Session - 10/01/15 1357    Visit Number 13   Number of Visits [facility] - Visit Number 13   Authorization - Number of Visits 13   PT Start Time 9937   PT Stop Time 1410   PT Time Calculation (min) 65 min   Activity Tolerance Patient tolerated treatment well      Past Medical History  Diagnosis Date  . Hypertension   . Diabetes mellitus without complication ([facility])   . COPD (chronic obstructive pulmonary disease) ([facility])   . CHF (congestive heart failure) ([facility])     Diastolic  . Anxiety   . Chronic back pain   . Hypothyroidism   . Pedal edema   . On home O2     2.5 L N/C prn  . Degenerative disk disease     Past Surgical History  Procedure Laterality Date  . Tubal ligation    . Hernia repair      There were no vitals filed for this visit.      Subjective Assessment - 10/01/15 1314    Subjective Pt states that she got her compression stockings on by herself; her daughter helps her with her reidsleeve at night.  Pt has not recieved her compression pump yet.    How long can you sit comfortably? no problem   How long can you stand comfortably? Able to stand for 5 minutes due to her bag.    How long can you walk comfortably? if pt has her walker she can walk for ten minutes due to her COPD    Currently in Pain? No/denies   Pain Score 0-No pain            [facility] PT Assessment - 10/01/15 0001    Observation/Other Assessments   Other Surveys  --  life impact score 42 was 85/100            09/17/2015 09/17/2015 09/27/2015 09/27/2015 10/02/2015 10/02/2015  RT  LT  RT LT RT  LT   26.7 25.6 36.3 26.1 27.2  25  27.40 26.30 28.40 28.00 27.90 28.00  29.50 29.30 28.50 28.20 29.00 28.30  34.70 34.80 32.60 30.80 36.30 33.00  41.00 40.80 37.50 35.90 42.50 40.50  44.70 42.90 41.70 39.20 44.00 33.40  47.70 46.10 45.30 44.40 47.00 44.30  53.30 53.20 49.30 46.00 47.60 44.00                                                                               12333.26 11877.08 11626.98 10121.90 11893.95 9953.19  3925.8 [id number] 3221.90199 2778.2423 5361.4431         Reviewed and completed self lymph massage with patient including supraclavicular; inguinal/axillary anastomosis And LE to the Rt and LT .             PT Education - 10/01/15  1356    Education provided Yes   Education Details self manual decongestive techniques.    Person(s) Educated Patient   Methods Explanation   Comprehension Verbalized understanding          PT Short Term Goals - 2015/10/06 1357    PT SHORT TERM GOAL #1   Title Pt to be able to verbalize the signs and symptoms of cellulitis and to be able to verbalize that she understands that people diagnosed with lymphedema hae an increase risk of cellulitis   Time 2   Period Weeks   Status Achieved   PT SHORT TERM GOAL #2   Title Pt pain to be no greater than a 4/10 throughout the day to allow pt to amblulate for at least 10 minutes at a time   Time 4   Period Weeks   Status Achieved   PT SHORT TERM GOAL #3   Title Pt to volume to decrease by 20% for improved fitting of clothes    Time 4   Period Weeks   PT SHORT TERM GOAL #4   Title Pt feeling of heaviness to decrease so that pt is able to easily lift her legs up onto the bed or couch    Time 4   Period Weeks   Status Achieved           PT Long Term Goals - 2015/10/06 1358    PT LONG TERM GOAL #1   Title PT volume to decrease by 35% to allow pt to fit into her compression stockings.    Time 8   Period Weeks   Status Achieved   PT LONG TERM GOAL #2   Title Pt pain to be no  greater than a 2/10 to allow pt feel confident going to and participating in [place] dances    Time 8   Period Weeks   Status Achieved   PT LONG TERM GOAL #3   Title Pt will be independent in donning/doffing compression garments for long term management of lymphedema.   Time 8   Period Weeks   Status Achieved               Plan - 10-06-2015 1359    Clinical Impression Statement Pt still does not have compression pump. Therapist called [facility] and they stated that they will look into it.  Pt life impact score has decreased from 85 to 42; pt able to don and doff compression garment.  Pt given and reviewed with therapist  self compression techniques. Pt volume of RT LE has decreased 140cc since 5/8; Lt has decreased 612 cc.  Pt is independent with donning and doffing of compression garment and has assistance with reidsleeve.    Rehab Potential Good   PT Treatment/Interventions Manual lymph drainage;Compression bandaging;Patient/family education   PT Next Visit Plan Discharge pt to self care.    Consulted and Agree with Plan of Care Patient      Patient will benefit from skilled therapeutic intervention in order to improve the following deficits and impairments:  Pain, Difficulty walking, Decreased activity tolerance, Decreased balance, Increased edema  Visit Diagnosis: Lymphedema, not elsewhere classified  Muscle weakness (generalized)  Difficulty in walking, not elsewhere classified       G-Codes - 10/06/15 1402    Functional Assessment Tool Used clinical judgement   Functional Limitation Other PT primary   Other PT Primary Goal Status (B4496) At least 40 percent but less than 60 percent impaired, limited or restricted  Other PT Primary Discharge Status [phone]) At least 40 percent but less than 60 percent impaired, limited or restricted      Problem List Patient Active Problem List   Diagnosis Date Noted  . CAP (community acquired pneumonia) 07/25/2015  .  Pressure ulcer 05/02/2015  . Hyponatremia 05/01/2015  . Acute-on-chronic kidney injury ([place]) 05/01/2015  . Diabetes mellitus with hyperglycemia, with long-term current use of insulin ([place]) 05/01/2015  . COPD exacerbation ([facility]) 04/30/2015  . Cellulitis of foot, right 03/24/2015  . Elevated d-dimer 03/24/2015  . Peripheral edema 12/04/2014  . Bilateral edema of lower extremity   . Diabetic ulcer of right great toe ([place])   . Foot ulcer due to secondary DM ([facility]) 11/07/2014  . Diabetic ulcer of right foot ([facility]) 11/07/2014  . Edema of lower extremity 05/27/2013  . Headache 05/26/2013  . CKD (chronic kidney disease), stage III 04/14/2013  . Leukocytosis 04/14/2013  . Anemia in CKD (chronic kidney disease) 04/14/2013  . Dyspnea 04/14/2013  . Chronic diastolic CHF (congestive heart failure) ([facility])   . COPD (chronic obstructive pulmonary disease) ([facility]) 08/07/2012  . Morbid obesity ([facility] [facility]) 08/07/2012  . DM type 2 (diabetes mellitus, type 2) ([facility]) 08/07/2012  . HTN (hypertension), benign 08/07/2012  . Hypothyroidism (acquired) 08/07/2012    RUSSELL,CINDY 10/01/2015, 2:03 PM  [facility] [address] [place], [name], [address] Phone: [phone]   Fax:  [phone]  Name: Stephanie Combs MRN: [id number] Date of Birth: 1950-06-15   PHYSICAL THERAPY DISCHARGE SUMMARY  Visits from Start of Care: 13  Current functional level related to goals / functional outcomes: See above   Remaining deficits: See above   Education / Equipment: Self massage   Plan: Patient agrees to discharge.  Patient goals were met. Patient is being discharged due to meeting the stated rehab goals.  ?????     Rayetta Humphrey, [place] CLT [phone]

## 2015-10-04 ENCOUNTER — Encounter (HOSPITAL_COMMUNITY): Payer: Medicare Other | Admitting: Physical Therapy

## 2015-10-09 ENCOUNTER — Encounter (HOSPITAL_COMMUNITY): Payer: Medicare Other | Admitting: Physical Therapy

## 2015-10-11 ENCOUNTER — Encounter (HOSPITAL_COMMUNITY): Payer: Medicare Other | Admitting: Physical Therapy

## 2015-11-01 DIAGNOSIS — J449 Chronic obstructive pulmonary disease, unspecified: Secondary | ICD-10-CM | POA: Diagnosis not present

## 2015-11-14 DIAGNOSIS — Z79899 Other long term (current) drug therapy: Secondary | ICD-10-CM | POA: Diagnosis not present

## 2015-11-14 DIAGNOSIS — I5032 Chronic diastolic (congestive) heart failure: Secondary | ICD-10-CM | POA: Diagnosis not present

## 2015-11-14 DIAGNOSIS — E038 Other specified hypothyroidism: Secondary | ICD-10-CM | POA: Diagnosis not present

## 2015-11-14 DIAGNOSIS — E1141 Type 2 diabetes mellitus with diabetic mononeuropathy: Secondary | ICD-10-CM | POA: Diagnosis not present

## 2015-11-14 DIAGNOSIS — J440 Chronic obstructive pulmonary disease with acute lower respiratory infection: Secondary | ICD-10-CM | POA: Diagnosis not present

## 2015-11-14 DIAGNOSIS — Z6841 Body Mass Index (BMI) 40.0 and over, adult: Secondary | ICD-10-CM | POA: Diagnosis not present

## 2015-11-14 DIAGNOSIS — N183 Chronic kidney disease, stage 3 (moderate): Secondary | ICD-10-CM | POA: Diagnosis not present

## 2015-11-14 DIAGNOSIS — Z5181 Encounter for therapeutic drug level monitoring: Secondary | ICD-10-CM | POA: Diagnosis not present

## 2015-11-14 DIAGNOSIS — M545 Low back pain: Secondary | ICD-10-CM | POA: Diagnosis not present

## 2015-11-19 ENCOUNTER — Other Ambulatory Visit: Payer: Self-pay | Admitting: *Deleted

## 2015-11-19 ENCOUNTER — Other Ambulatory Visit: Payer: Self-pay | Admitting: Adult Health

## 2015-12-01 DIAGNOSIS — J449 Chronic obstructive pulmonary disease, unspecified: Secondary | ICD-10-CM | POA: Diagnosis not present

## 2015-12-05 ENCOUNTER — Encounter (HOSPITAL_COMMUNITY): Payer: Self-pay | Admitting: *Deleted

## 2015-12-05 ENCOUNTER — Observation Stay (HOSPITAL_COMMUNITY)
Admission: EM | Admit: 2015-12-05 | Discharge: 2015-12-06 | Disposition: A | Discharge status: Home / Self Care | Payer: Medicare Other | Attending: Internal Medicine | Admitting: Internal Medicine

## 2015-12-05 DIAGNOSIS — R531 Weakness: Secondary | ICD-10-CM | POA: Diagnosis not present

## 2015-12-05 DIAGNOSIS — E039 Hypothyroidism, unspecified: Secondary | ICD-10-CM | POA: Diagnosis not present

## 2015-12-05 DIAGNOSIS — Z7982 Long term (current) use of aspirin: Secondary | ICD-10-CM | POA: Insufficient documentation

## 2015-12-05 DIAGNOSIS — N183 Chronic kidney disease, stage 3 (moderate): Secondary | ICD-10-CM | POA: Diagnosis not present

## 2015-12-05 DIAGNOSIS — E119 Type 2 diabetes mellitus without complications: Secondary | ICD-10-CM | POA: Diagnosis not present

## 2015-12-05 DIAGNOSIS — J449 Chronic obstructive pulmonary disease, unspecified: Secondary | ICD-10-CM | POA: Diagnosis not present

## 2015-12-05 DIAGNOSIS — Z79899 Other long term (current) drug therapy: Secondary | ICD-10-CM | POA: Diagnosis not present

## 2015-12-05 DIAGNOSIS — I5032 Chronic diastolic (congestive) heart failure: Secondary | ICD-10-CM | POA: Insufficient documentation

## 2015-12-05 DIAGNOSIS — Z794 Long term (current) use of insulin: Secondary | ICD-10-CM | POA: Insufficient documentation

## 2015-12-05 DIAGNOSIS — R29898 Other symptoms and signs involving the musculoskeletal system: Secondary | ICD-10-CM | POA: Diagnosis present

## 2015-12-05 DIAGNOSIS — Z87891 Personal history of nicotine dependence: Secondary | ICD-10-CM | POA: Insufficient documentation

## 2015-12-05 DIAGNOSIS — I639 Cerebral infarction, unspecified: Secondary | ICD-10-CM | POA: Diagnosis not present

## 2015-12-05 DIAGNOSIS — Z7984 Long term (current) use of oral hypoglycemic drugs: Secondary | ICD-10-CM | POA: Insufficient documentation

## 2015-12-05 DIAGNOSIS — R200 Anesthesia of skin: Secondary | ICD-10-CM | POA: Diagnosis present

## 2015-12-05 DIAGNOSIS — I6789 Other cerebrovascular disease: Secondary | ICD-10-CM | POA: Diagnosis not present

## 2015-12-05 DIAGNOSIS — I130 Hypertensive heart and chronic kidney disease with heart failure and stage 1 through stage 4 chronic kidney disease, or unspecified chronic kidney disease: Secondary | ICD-10-CM | POA: Diagnosis not present

## 2015-12-05 LAB — CBC
HCT: 35.8 % — ABNORMAL LOW (ref 36.0–46.0)
Hemoglobin: 11.6 g/dL — ABNORMAL LOW (ref 12.0–15.0)
MCH: 28.2 pg (ref 26.0–34.0)
MCHC: 32.4 g/dL (ref 30.0–36.0)
MCV: 86.9 fL (ref 78.0–100.0)
Platelets: 284 10*3/uL (ref 150–400)
RBC: 4.12 MIL/uL (ref 3.87–5.11)
RDW: 14.2 % (ref 11.5–15.5)
WBC: 6.2 10*3/uL (ref 4.0–10.5)

## 2015-12-05 LAB — DIFFERENTIAL
Basophils Absolute: 0.1 10*3/uL (ref 0.0–0.1)
Basophils Relative: 2 %
Eosinophils Absolute: 0.2 10*3/uL (ref 0.0–0.7)
Eosinophils Relative: 3 %
Lymphocytes Relative: 32 %
Lymphs Abs: 2 10*3/uL (ref 0.7–4.0)
Monocytes Absolute: 0.6 10*3/uL (ref 0.1–1.0)
Monocytes Relative: 9 %
Neutro Abs: 3.4 10*3/uL (ref 1.7–7.7)
Neutrophils Relative %: 54 %

## 2015-12-05 NOTE — ED Provider Notes (Signed)
[facility] DEPT Provider Note   CSN: [id number] Arrival date & time: 12/05/15  2333  First Provider Contact: 12/05/2015 11:36 PM By signing my name below, I, Georgette Shell, attest that this documentation has been prepared under the direction and in the presence of Orpah Greek, MD. Electronically Signed: Georgette Shell, ED Scribe. 12/05/15. 11:45 PM.    History   Chief Complaint Chief Complaint  Patient presents with  . Extremity Weakness    HPI Comments: Stephanie Combs is a 65 y.o. female with h/o COPD, CHF, DM and HTN who presents to the Emergency Department by EMS complaining of sudden onset, constant left hand and arm numbness and pain onset just PTA. Pt was taking a nap when it came on and woke her up out of her sleep. Pt reports the numbness and pain have spread to her bilateral legs and left cheek. Pt also has associated headache. Pt took two Aspirins with no relief. She notes her CBG has been running high recently. Pt is in no acute distress. Pt denies fever.  The history is provided by the patient. No language interpreter was used.    Past Medical History:  Diagnosis Date  . Anxiety   . CHF (congestive heart failure) ([facility])    Diastolic  . Chronic back pain   . COPD (chronic obstructive pulmonary disease) ([facility])   . Degenerative disk disease   . Diabetes mellitus without complication ([facility])   . Hypertension   . Hypothyroidism   . On home O2    2.5 L N/C prn  . Pedal edema     Patient Active Problem List   Diagnosis Date Noted  . CAP (community acquired pneumonia) 07/25/2015  . Pressure ulcer 05/02/2015  . Hyponatremia 05/01/2015  . Acute-on-chronic kidney injury ([facility]) 05/01/2015  . Diabetes mellitus with hyperglycemia, with long-term current use of insulin ([facility]) 05/01/2015  . COPD exacerbation ([place]) 04/30/2015  . Cellulitis of foot, right 03/24/2015  . Elevated d-dimer 03/24/2015  . Peripheral edema 12/04/2014  . Bilateral edema of lower extremity   .  Diabetic ulcer of right great toe ([place])   . Foot ulcer due to secondary DM ([place] [facility]) 11/07/2014  . Diabetic ulcer of right foot ([facility]) 11/07/2014  . Edema of lower extremity 05/27/2013  . Headache 05/26/2013  . CKD (chronic kidney disease), stage III 04/14/2013  . Leukocytosis 04/14/2013  . Anemia in CKD (chronic kidney disease) 04/14/2013  . Dyspnea 04/14/2013  . Chronic diastolic CHF (congestive heart failure) ([facility])   . COPD (chronic obstructive pulmonary disease) ([facility]) 08/07/2012  . Morbid obesity ([facility]) 08/07/2012  . DM type 2 (diabetes mellitus, type 2) ([facility]) 08/07/2012  . HTN (hypertension), benign 08/07/2012  . Hypothyroidism (acquired) 08/07/2012    Past Surgical History:  Procedure Laterality Date  . HERNIA REPAIR    . TUBAL LIGATION      OB History    No data available       Home Medications    Prior to Admission medications   Medication Sig Start Date End Date Taking? Authorizing Provider  albuterol (PROAIR HFA) 108 (90 BASE) MCG/ACT inhaler Inhale 1 puff into the lungs every 4 (four) hours as needed. For shortness of breath Patient taking differently: Inhale 2 puffs into the lungs every 4 (four) hours as needed. For shortness of breath 05/30/13   Kinnie Feil, MD  ALPRAZolam Duanne Moron) 1 MG tablet Take 1 tablet (1 mg total) by mouth 3 (three) times daily as needed for anxiety. 07/26/15  Orvan Falconer, MD  aspirin EC 81 MG tablet Take 81 mg by mouth daily.    Historical Provider, MD  diltiazem (CARDIZEM CD) 240 MG 24 hr capsule Take 1 capsule (240 mg total) by mouth daily. 05/30/13   Kinnie Feil, MD  empagliflozin (JARDIANCE) 25 MG TABS tablet Take 25 mg by mouth daily.    Historical Provider, MD  furosemide (LASIX) 20 MG tablet Take 2 tablets (40 mg total) by mouth 2 (two) times daily. 12/06/14   Erline Hau, MD  gabapentin (NEURONTIN) 400 MG capsule Take 3 capsules (1,200 mg total) by mouth 2 (two) times daily. 05/02/15   Orvan Falconer, MD    HYDROcodone-homatropine [facility]) 5-1.5 MG/5ML syrup Take 5 mLs by mouth every 6 (six) hours as needed for cough. 05/02/15   Orvan Falconer, MD  insulin aspart (NOVOLOG) 100 UNIT/ML injection Inject 0-20 Units into the skin 3 (three) times daily with meals. Takes 20 Units in am and 20 Units at night.  Per sliding scale 05/30/13   Kinnie Feil, MD  insulin aspart (NOVOLOG) 100 UNIT/ML injection Inject 12 Units into the skin 3 (three) times daily with meals. 05/02/15   Orvan Falconer, MD  insulin detemir (LEVEMIR) 100 UNIT/ML injection Inject 0.2 mLs (20 Units total) into the skin at bedtime. 07/26/15   Orvan Falconer, MD  levothyroxine (SYNTHROID, LEVOTHROID) 125 MCG tablet Take 2 tablets (250 mcg total) by mouth daily before breakfast. 05/30/13   Kinnie Feil, MD  menthol-cetylpyridinium (CEPACOL) 3 MG lozenge Take 1 lozenge (3 mg total) by mouth as needed for sore throat. 03/25/15   Donne Hazel, MD  metFORMIN (GLUCOPHAGE) 500 MG tablet Take 500 mg by mouth 2 (two) times daily with a meal.    Historical Provider, MD  nicotine (NICODERM CQ - DOSED IN MG/24 HOURS) 21 mg/24hr patch Place 1 patch (21 mg total) onto the skin daily. 05/30/13   Kinnie Feil, MD  nystatin (MYCOSTATIN/NYSTOP) 100000 UNIT/GM POWD Apply 1 g topically 2 (two) times daily.    Historical Provider, MD  Omega-3 Fatty Acids (FISH OIL PO) Take 1 capsule by mouth 2 (two) times daily.     Historical Provider, MD  ondansetron (ZOFRAN ODT) 8 MG disintegrating tablet Take 1 tablet (8 mg total) by mouth every 8 (eight) hours as needed for nausea or vomiting. 12/31/13   Jola Schmidt, MD  oxyCODONE-acetaminophen (PERCOCET/ROXICET) 5-325 MG tablet Take 2 tablets by mouth every 6 (six) hours as needed for moderate pain. 05/02/15   Orvan Falconer, MD  pantoprazole (PROTONIX) 40 MG tablet Take 1 tablet (40 mg total) by mouth 2 (two) times daily. 11/10/14   Radene Gunning, NP  potassium chloride (K-DUR) 10 MEQ tablet Take 20 mEq by mouth 2 (two) times daily.      Historical Provider, MD  saccharomyces boulardii (FLORASTOR) 250 MG capsule Take 1 capsule (250 mg total) by mouth 2 (two) times daily. 03/25/15   Donne Hazel, MD    Family History Family History  Problem Relation Age of Onset  . Stroke Mother   . Heart attack Father   . Diabetes Brother   . Cerebral palsy Brother   . Pneumonia Brother   . Diabetes Other   . Heart attack Other     Social History Social History  Substance Use Topics  . Smoking status: Former Smoker    Packs/day: 0.25    Types: Cigarettes    Quit date: 06/27/2015  . Smokeless tobacco: Never  Used  . Alcohol use No     Allergies   Dilaudid [hydromorphone hcl]; Actifed cold-allergy [chlorpheniramine-phenylephrine]; Doxycycline; Other; Penicillins; Reglan [metoclopramide]; Valium [diazepam]; and Vistaril [hydroxyzine hcl]   Review of Systems Review of Systems  Constitutional: Negative for fever.  Gastrointestinal: Negative for vomiting.  Neurological: Positive for numbness and headaches.   Physical Exam Updated Vital Signs BP 147/70 (BP Location: Left Arm)   Pulse 77   Temp 97.9 F (36.6 C) (Oral)   Resp 20   Ht '5\' 6"'$  (1.676 m)   Wt 266 lb (120.7 kg)   SpO2 94%   BMI 42.93 kg/m   Physical Exam  Constitutional: She is oriented to person, place, and time. She appears well-developed and well-nourished. No distress.  HENT:  Head: Normocephalic and atraumatic.  Right Ear: Hearing normal.  Left Ear: Hearing normal.  Nose: Nose normal.  Mouth/Throat: Oropharynx is clear and moist and mucous membranes are normal.  Eyes: Conjunctivae and EOM are normal. Pupils are equal, round, and reactive to light.  Neck: Normal range of motion. Neck supple.  Cardiovascular: Regular rhythm, S1 normal and S2 normal.  Exam reveals no gallop and no friction rub.   No murmur heard. Pulmonary/Chest: Effort normal and breath sounds normal. No respiratory distress. She exhibits no tenderness.  Abdominal: Soft. Normal  appearance and bowel sounds are normal. There is no hepatosplenomegaly. There is no tenderness. There is no rebound, no guarding, no tenderness at McBurney's point and negative Murphy's sign. No hernia.  Musculoskeletal: Normal range of motion.  Neurological: She is alert and oriented to person, place, and time. She has normal strength. No cranial nerve deficit or sensory deficit. Coordination normal. GCS eye subscore is 4. GCS verbal subscore is 5. GCS motor subscore is 6.  3/5 strength of LUE with decreased sensation. Subjective decreased sensation in left cheek without droop.  Skin: Skin is warm, dry and intact. No rash noted. No cyanosis.  Psychiatric: She has a normal mood and affect. Her speech is normal and behavior is normal. Thought content normal.  Nursing note and vitals reviewed.    ED Treatments / Results  DIAGNOSTIC STUDIES: Oxygen Saturation is 94% on RA, poor by my interpretation.    COORDINATION OF CARE: 11:45 PM Discussed treatment plan with pt at bedside and pt agreed to plan.  Labs (all labs ordered are listed, but only abnormal results are displayed) Labs Reviewed  CBC - Abnormal; Notable for the following:       Result Value   Hemoglobin 11.6 (*)    HCT 35.8 (*)    All other components within normal limits  PROTIME-INR  APTT  DIFFERENTIAL  ETHANOL  COMPREHENSIVE METABOLIC PANEL  URINE RAPID DRUG SCREEN, HOSP PERFORMED  URINALYSIS, ROUTINE W REFLEX MICROSCOPIC (NOT AT [facility])  TROPONIN I  I-STAT CHEM 8, ED    EKG  EKG Interpretation None       Radiology Ct Head Code Stroke W/o Cm  Result Date: 12/06/2015 CLINICAL DATA:  Code stroke.  Constant left hand pain and numbness. EXAM: CT HEAD WITHOUT CONTRAST TECHNIQUE: Contiguous axial images were obtained from the base of the skull through the vertex without intravenous contrast. COMPARISON:  None. FINDINGS: Scattered periventricular hypodensities compatible with microvascular ischemic disease.  Calcifications within the right basal ganglia. The gray-white differentiation is otherwise well maintained without CT evidence of acute large territory infarct. No intraparenchymal or extra-axial mass or hemorrhage. Normal size and configuration of the ventricles and basilar cisterns. No midline shift. Limited  visualization the paranasal sinuses and mastoid air cells is normal. No air-fluid levels. Regional soft tissues appear normal. No displaced calvarial fracture. Note is made of hyperostosis frontalis. IMPRESSION: Microvascular ischemic disease without acute intracranial process. Critical Value/emergent results were called by telephone at the time of interpretation on 12/06/2015 at 12:07 am to Dr. Joseph Berkshire , who verbally acknowledged these results. Electronically Signed   By: Sandi Mariscal M.D.   On: 12/06/2015 00:09   Procedures Procedures (including critical care time)  Medications Ordered in ED Medications - No data to display   Initial Impression / Assessment and Plan / ED Course  I have reviewed the triage vital signs and the nursing notes.  Pertinent labs & imaging results that were available during my care of the patient were reviewed by me and considered in my medical decision making (see chart for details).  Clinical Course    Patient presented to emergency department from home by ambulance. Patient had constant to sleep and then awakened with numbness and tingling of the left arm. Patient reports decreased ability to use the left arm. She was also experiencing subjective numbness on the left side of her face and left leg without any cervical deficits. She did, however, have decreased strength of the left upper extremity.  A Code Stroke was initiated. Patient was evaluated in conjunction with Dr. Donivan Scull, neurology on-call. After examination and further history taking, it was felt that the patient was out of treatment window based on the amount of time she was a sleep and  inability to clearly pin down onset of symptoms. Recommendation was therefore treated with aspirin and admitted for stroke workup.  Final Clinical Impressions(s) / ED Diagnoses   Final diagnoses:  None  CVA  New Prescriptions New Prescriptions   No medications on file  I personally performed the services described in this documentation, which was scribed in my presence. The recorded information has been reviewed and is accurate.     Orpah Greek, MD 12/06/15 [phone]

## 2015-12-05 NOTE — ED Triage Notes (Signed)
Pt c/o pain and numbness to left arm; pt states her cbg's have been running high lately

## 2015-12-06 ENCOUNTER — Observation Stay (HOSPITAL_COMMUNITY): Payer: Medicare Other

## 2015-12-06 ENCOUNTER — Observation Stay (HOSPITAL_BASED_OUTPATIENT_CLINIC_OR_DEPARTMENT_OTHER): Payer: Medicare Other

## 2015-12-06 ENCOUNTER — Emergency Department (HOSPITAL_COMMUNITY): Payer: Medicare Other

## 2015-12-06 ENCOUNTER — Encounter (HOSPITAL_COMMUNITY): Payer: Self-pay | Admitting: *Deleted

## 2015-12-06 DIAGNOSIS — R918 Other nonspecific abnormal finding of lung field: Secondary | ICD-10-CM | POA: Diagnosis not present

## 2015-12-06 DIAGNOSIS — R200 Anesthesia of skin: Secondary | ICD-10-CM | POA: Diagnosis not present

## 2015-12-06 DIAGNOSIS — R29898 Other symptoms and signs involving the musculoskeletal system: Secondary | ICD-10-CM | POA: Diagnosis present

## 2015-12-06 DIAGNOSIS — I6789 Other cerebrovascular disease: Secondary | ICD-10-CM | POA: Diagnosis not present

## 2015-12-06 DIAGNOSIS — R531 Weakness: Secondary | ICD-10-CM | POA: Diagnosis not present

## 2015-12-06 DIAGNOSIS — M6289 Other specified disorders of muscle: Secondary | ICD-10-CM | POA: Diagnosis not present

## 2015-12-06 DIAGNOSIS — I639 Cerebral infarction, unspecified: Secondary | ICD-10-CM | POA: Diagnosis present

## 2015-12-06 LAB — COMPREHENSIVE METABOLIC PANEL
ALT: 11 U/L — ABNORMAL LOW (ref 14–54)
AST: 16 U/L (ref 15–41)
Albumin: 4 g/dL (ref 3.5–5.0)
Alkaline Phosphatase: 88 U/L (ref 38–126)
Anion gap: 5 (ref 5–15)
BUN: 32 mg/dL — ABNORMAL HIGH (ref 6–20)
CO2: 24 mmol/L (ref 22–32)
Calcium: 8.8 mg/dL — ABNORMAL LOW (ref 8.9–10.3)
Chloride: 111 mmol/L (ref 101–111)
Creatinine, Ser: 1.55 mg/dL — ABNORMAL HIGH (ref 0.44–1.00)
GFR calc Af Amer: 39 mL/min — ABNORMAL LOW (ref >60)
GFR calc non Af Amer: 34 mL/min — ABNORMAL LOW (ref >60)
Glucose, Bld: 221 mg/dL — ABNORMAL HIGH (ref 65–99)
Potassium: 4.1 mmol/L (ref 3.5–5.1)
Sodium: 140 mmol/L (ref 135–145)
Total Bilirubin: 0.4 mg/dL (ref 0.3–1.2)
Total Protein: 6.9 g/dL (ref 6.5–8.1)

## 2015-12-06 LAB — GLUCOSE, CAPILLARY
Glucose-Capillary: 220 mg/dL — ABNORMAL HIGH (ref 65–99)
Glucose-Capillary: 174 mg/dL — ABNORMAL HIGH (ref 65–99)
Glucose-Capillary: 263 mg/dL — ABNORMAL HIGH (ref 65–99)
Glucose-Capillary: 179 mg/dL — ABNORMAL HIGH (ref 65–99)

## 2015-12-06 LAB — LIPID PANEL
Cholesterol: 186 mg/dL (ref 0–200)
HDL: 39 mg/dL — ABNORMAL LOW (ref >40)
LDL Cholesterol: 110 mg/dL — ABNORMAL HIGH (ref 0–99)
Total CHOL/HDL Ratio: 4.8 RATIO
Triglycerides: 185 mg/dL — ABNORMAL HIGH (ref <150)
VLDL: 37 mg/dL (ref 0–40)

## 2015-12-06 LAB — PROTIME-INR
INR: 0.95
Prothrombin Time: 12.7 seconds (ref 11.4–15.2)

## 2015-12-06 LAB — ECHOCARDIOGRAM COMPLETE
Height: 66 in
Weight: 4254 oz

## 2015-12-06 LAB — RAPID URINE DRUG SCREEN, HOSP PERFORMED
Amphetamines: NOT DETECTED
Barbiturates: NOT DETECTED
Benzodiazepines: POSITIVE — AB
Cocaine: NOT DETECTED
Opiates: NOT DETECTED
Tetrahydrocannabinol: NOT DETECTED

## 2015-12-06 LAB — TROPONIN I: Troponin I: <0.03 ng/mL (ref <0.03)

## 2015-12-06 LAB — APTT: aPTT: 26 seconds (ref 24–36)

## 2015-12-06 LAB — ETHANOL: Alcohol, Ethyl (B): <5 mg/dL (ref <5)

## 2015-12-06 MED ORDER — NYSTATIN 100000 UNIT/GM EX POWD
Freq: Two times a day (BID) | CUTANEOUS | Status: DC
  Filled 2015-12-06: qty 15

## 2015-12-06 MED ORDER — HYDROCODONE-HOMATROPINE 5-1.5 MG/5ML PO SYRP: 5.0000 mL | ORAL_SOLUTION | Freq: Four times a day (QID) | ORAL | Status: DC | PRN

## 2015-12-06 MED ORDER — SENNOSIDES-DOCUSATE SODIUM 8.6-50 MG PO TABS
1.0000 | ORAL_TABLET | Freq: Every evening | ORAL | Status: DC | PRN
  Administered 2015-12-06: 1 via ORAL
  Filled 2015-12-06: qty 1

## 2015-12-06 MED ORDER — INSULIN ASPART 100 UNIT/ML ~~LOC~~ SOLN
0.0000 [IU] | Freq: Three times a day (TID) | SUBCUTANEOUS | Status: DC
  Administered 2015-12-06: 2 [IU] via SUBCUTANEOUS
  Administered 2015-12-06: 5 [IU] via SUBCUTANEOUS
  Administered 2015-12-06: 2 [IU] via SUBCUTANEOUS

## 2015-12-06 MED ORDER — PANTOPRAZOLE SODIUM 40 MG PO TBEC
40.0000 mg | DELAYED_RELEASE_TABLET | Freq: Two times a day (BID) | ORAL | Status: DC
  Administered 2015-12-06 (×2): 40 mg via ORAL
  Filled 2015-12-06 (×2): qty 1

## 2015-12-06 MED ORDER — INSULIN DETEMIR 100 UNIT/ML ~~LOC~~ SOLN
20.0000 [IU] | Freq: Every day | SUBCUTANEOUS | Status: DC
  Administered 2015-12-06: 20 [IU] via SUBCUTANEOUS
  Filled 2015-12-06 (×2): qty 0.2

## 2015-12-06 MED ORDER — ASPIRIN EC 81 MG PO TBEC
81.0000 mg | DELAYED_RELEASE_TABLET | Freq: Every day | ORAL | Status: DC
  Administered 2015-12-06: 81 mg via ORAL
  Filled 2015-12-06: qty 1

## 2015-12-06 MED ORDER — SODIUM CHLORIDE 0.9 % IV SOLN: INTRAVENOUS | Status: DC

## 2015-12-06 MED ORDER — STROKE: EARLY STAGES OF RECOVERY BOOK
Freq: Once | Status: DC
  Filled 2015-12-06: qty 1

## 2015-12-06 MED ORDER — ALUM & MAG HYDROXIDE-SIMETH 200-200-20 MG/5ML PO SUSP
30.0000 mL | Freq: Four times a day (QID) | ORAL | Status: DC | PRN
  Filled 2015-12-06: qty 30

## 2015-12-06 MED ORDER — ALPRAZOLAM 1 MG PO TABS
1.0000 mg | ORAL_TABLET | Freq: Three times a day (TID) | ORAL | Status: DC | PRN
  Administered 2015-12-06 (×2): 1 mg via ORAL
  Filled 2015-12-06 (×3): qty 1

## 2015-12-06 MED ORDER — ENOXAPARIN SODIUM 40 MG/0.4ML ~~LOC~~ SOLN
40.0000 mg | SUBCUTANEOUS | Status: DC
  Administered 2015-12-06: 40 mg via SUBCUTANEOUS
  Filled 2015-12-06: qty 0.4

## 2015-12-06 MED ORDER — LEVOTHYROXINE SODIUM 50 MCG PO TABS
250.0000 ug | ORAL_TABLET | Freq: Every day | ORAL | Status: DC
  Administered 2015-12-06: 250 ug via ORAL
  Filled 2015-12-06: qty 1

## 2015-12-06 MED ORDER — GABAPENTIN 400 MG PO CAPS
1200.0000 mg | ORAL_CAPSULE | Freq: Two times a day (BID) | ORAL | Status: DC
  Administered 2015-12-06 (×2): 1200 mg via ORAL
  Filled 2015-12-06 (×2): qty 3

## 2015-12-06 NOTE — Progress Notes (Signed)
Inpatient Diabetes Program Recommendations  AACE/ADA: New Consensus Statement on Inpatient Glycemic Control (2015)  Target Ranges:  Prepandial:   less than 140 mg/dL      Peak postprandial:   less than 180 mg/dL (1-2 hours)      Critically ill patients:  140 - 180 mg/dL   Results for Stephanie Combs, Stephanie Combs (MRN [id number]) as of 12/06/2015 12:50  Ref. Range 07/27/2015 07:20 07/27/2015 11:14 12/06/2015 03:48 12/06/2015 08:28 12/06/2015 11:40  Glucose-Capillary Latest Ref Range: 65 - 99 mg/dL 246 (H) 288 (H) 220 (H) 174 (H) 263 (H)    Review of Glycemic Control  Diabetes history: DM2 Outpatient Diabetes medications: Levemir 40 units + Novolog 20 units ac bid + Metformin 500 mg bid Current orders for Inpatient glycemic control: Levemir 20 units hs + Novolog correction 0-9 units tid  Inpatient Diabetes Program Recommendations:  Noted patient eating poorly @ present. Will follow.   Thank you, Nani Gasser. Duanna Runk, RN, MSN, CDE Inpatient Glycemic Control Team Team Pager [phone] (8am-5pm) 12/06/2015 12:55 PM

## 2015-12-06 NOTE — ED Notes (Signed)
[facility] called @ [phone]

## 2015-12-06 NOTE — Progress Notes (Signed)
*  PRELIMINARY RESULTS* Echocardiogram 2D Echocardiogram has been performed.  Leavy Cella 12/06/2015, 4:34 PM

## 2015-12-06 NOTE — Care Management Note (Signed)
Case Management Note  Patient Details  Name: Stephanie Combs MRN: [id number] Date of Birth: 08/01/1950  Subjective/Objective: Patient adm from home with stroke like symptoms. Ind with ADL's. Has a PCP and insurance, reports no issues. OT has recommended OP OT.                   Action/Plan: Faxed order for OP OT. Patient is agreeable.    Expected Discharge Date:                  Expected Discharge Plan:  Home/Self Care  In-House Referral:  NA  Discharge planning Services  CM Consult  Post Acute Care Choice:  NA Choice offered to:  NA  DME Arranged:    DME Agency:     HH Arranged:    HH Agency:     Status of Service:  Completed, signed off  If discussed at [employer] of Stay Meetings, dates discussed:    Additional Comments:  Damin Salido, Chauncey Reading, RN 12/06/2015, 3:12 PM

## 2015-12-06 NOTE — H&P (Signed)
TRH H&P   Patient Demographics:    Stephanie Combs, is a 65 y.o. female  MRN: [id number]  DOB - 01-19-1951  Admit Date - 12/05/2015  Outpatient Primary MD for the patient is Neale Burly, MD  Referring MD/NP/PA: Dr Betsey Holiday  Patient coming from: Home  Chief Complaint  Patient presents with  . Extremity Weakness      HPI:    Stephanie Combs  is a 65 y.o. female, History of COPD, CHF, diabetes mellitus, hypertension who came to the ED with sudden onset left hand weakness and numbness. Patient was taking a nap when the symptoms came on and woke patient from sleep. Patient also complains of numbness in the left leg. She took 2 aspirin at home before coming to the hospital." Was initiated and patient was seen by neurology on call Dr. Donivan Scull, after the examination and neurological evaluation it was felt the patient was not a TPA candidate, due to inability to clearly pin down onset of symptoms. Patient admitted for stroke workup. She denies chest pain, no shortness of breath. No nausea vomiting or diarrhea. CT head negative for stroke     Review of systems:    In addition to the HPI above,  No Fever-chills, No Headache, No changes with Vision or hearing, No problems swallowing food or Liquids, No Chest pain, Cough or Shortness of Breath, No Abdominal pain, No Nausea or Vomiting, bowel movements are regular, No Blood in stool or Urine, No dysuria, No new skin rashes or bruises, No new joints pains-aches,  No new weakness, tingling, numbness in any extremity, No recent weight gain or loss, No polyuria, polydypsia or polyphagia, No significant Mental Stressors.  A full 10 point Review of Systems was done, except as stated above, all other Review of Systems were negative.   With Past History of the following :    Past Medical History:  Diagnosis Date  . Anxiety   . CHF (congestive heart failure)  (HCC)    Diastolic  . Chronic back pain   . COPD (chronic obstructive pulmonary disease) ([place])   . Degenerative disk disease   . Diabetes mellitus without complication ([place])   . Hypertension   . Hypothyroidism   . On home O2    2.5 L N/C prn  . Pedal edema       Past Surgical History:  Procedure Laterality Date  . HERNIA REPAIR    . TUBAL LIGATION        Social History:     Social History  Substance Use Topics  . Smoking status: Former Smoker    Packs/day: 0.25    Types: Cigarettes    Quit date: 06/27/2015  . Smokeless tobacco: Never Used  . Alcohol use No         Family History :     Family History  Problem Relation Age of Onset  . Stroke Mother   . Heart attack Father   . Diabetes Brother   . Cerebral palsy Brother   .  Pneumonia Brother   . Diabetes Other   . Heart attack Other       Home Medications:   Prior to Admission medications   Medication Sig Start Date End Date Taking? Authorizing Provider  albuterol (PROAIR HFA) 108 (90 BASE) MCG/ACT inhaler Inhale 1 puff into the lungs every 4 (four) hours as needed. For shortness of breath Patient taking differently: Inhale 2 puffs into the lungs every 4 (four) hours as needed. For shortness of breath 05/30/13   Kinnie Feil, MD  ALPRAZolam Duanne Moron) 1 MG tablet Take 1 tablet (1 mg total) by mouth 3 (three) times daily as needed for anxiety. 07/26/15   Orvan Falconer, MD  aspirin EC 81 MG tablet Take 81 mg by mouth daily.    Historical Provider, MD  diltiazem (CARDIZEM CD) 240 MG 24 hr capsule Take 1 capsule (240 mg total) by mouth daily. 05/30/13   Kinnie Feil, MD  empagliflozin (JARDIANCE) 25 MG TABS tablet Take 25 mg by mouth daily.    Historical Provider, MD  furosemide (LASIX) 20 MG tablet Take 2 tablets (40 mg total) by mouth 2 (two) times daily. 12/06/14   Erline Hau, MD  gabapentin (NEURONTIN) 400 MG capsule Take 3 capsules (1,200 mg total) by mouth 2 (two) times daily. 05/02/15   Orvan Falconer, MD  HYDROcodone-homatropine [facility]) 5-1.5 MG/5ML syrup Take 5 mLs by mouth every 6 (six) hours as needed for cough. 05/02/15   Orvan Falconer, MD  insulin aspart (NOVOLOG) 100 UNIT/ML injection Inject 0-20 Units into the skin 3 (three) times daily with meals. Takes 20 Units in am and 20 Units at night.  Per sliding scale 05/30/13   Kinnie Feil, MD  insulin aspart (NOVOLOG) 100 UNIT/ML injection Inject 12 Units into the skin 3 (three) times daily with meals. 05/02/15   Orvan Falconer, MD  insulin detemir (LEVEMIR) 100 UNIT/ML injection Inject 0.2 mLs (20 Units total) into the skin at bedtime. 07/26/15   Orvan Falconer, MD  levothyroxine (SYNTHROID, LEVOTHROID) 125 MCG tablet Take 2 tablets (250 mcg total) by mouth daily before breakfast. 05/30/13   Kinnie Feil, MD  menthol-cetylpyridinium (CEPACOL) 3 MG lozenge Take 1 lozenge (3 mg total) by mouth as needed for sore throat. 03/25/15   Donne Hazel, MD  metFORMIN (GLUCOPHAGE) 500 MG tablet Take 500 mg by mouth 2 (two) times daily with a meal.    Historical Provider, MD  nicotine (NICODERM CQ - DOSED IN MG/24 HOURS) 21 mg/24hr patch Place 1 patch (21 mg total) onto the skin daily. 05/30/13   Kinnie Feil, MD  nystatin (MYCOSTATIN/NYSTOP) 100000 UNIT/GM POWD Apply 1 g topically 2 (two) times daily.    Historical Provider, MD  Omega-3 Fatty Acids (FISH OIL PO) Take 1 capsule by mouth 2 (two) times daily.     Historical Provider, MD  ondansetron (ZOFRAN ODT) 8 MG disintegrating tablet Take 1 tablet (8 mg total) by mouth every 8 (eight) hours as needed for nausea or vomiting. 12/31/13   Jola Schmidt, MD  oxyCODONE-acetaminophen (PERCOCET/ROXICET) 5-325 MG tablet Take 2 tablets by mouth every 6 (six) hours as needed for moderate pain. 05/02/15   Orvan Falconer, MD  pantoprazole (PROTONIX) 40 MG tablet Take 1 tablet (40 mg total) by mouth 2 (two) times daily. 11/10/14   Radene Gunning, NP  potassium chloride (K-DUR) 10 MEQ tablet Take 20 mEq by mouth 2 (two) times daily.      Historical Provider, MD  saccharomyces  boulardii (FLORASTOR) 250 MG capsule Take 1 capsule (250 mg total) by mouth 2 (two) times daily. 03/25/15   Donne Hazel, MD     Allergies:     Allergies  Allergen Reactions  . Dilaudid [Hydromorphone Hcl] Itching  . Actifed Cold-Allergy [Chlorpheniramine-Phenylephrine]     "I was sick and red and it didn't agree with me at all"  . Doxycycline Nausea And Vomiting  . Other Cough    Pt states she is allergic to ragweed and that she starts coughing and sneezing like crazy  . Penicillins Hives and Itching    Has patient had a PCN reaction causing immediate rash, facial/tongue/throat swelling, SOB or lightheadedness with hypotension: Yes Has patient had a PCN reaction causing severe rash involving mucus membranes or skin necrosis: No Has patient had a PCN reaction that required hospitalization Yes Has patient had a PCN reaction occurring within the last 10 years: No If all of the above answers are "NO", then may proceed with Cephalosporin use.   . Reglan [Metoclopramide] Itching  . Valium [Diazepam] Itching  . Vistaril [Hydroxyzine Hcl] Itching     Physical Exam:   Vitals  Blood pressure 140/66, pulse 72, temperature 97.7 F (36.5 C), resp. rate 19, height '5\' 6"'$  (1.676 m), weight 120.7 kg (266 lb), SpO2 100 %.   1. General morbidly obese female  lying in bed in NAD, cooperative with exam  2. Normal affect and insight, Awake Alert, Oriented X 3.  3. No F.N deficits, ALL C.Nerves Intact, Strength 5/5 all 4 extremities, Sensation reduced in left upper and lower extremity. Left hand in flexed position.  4. Ears and Eyes appear Normal, Conjunctivae clear, PERRLA. Moist Oral Mucosa.  5. Supple Neck, No JVD, No cervical lymphadenopathy appriciated, No Carotid Bruits.  6. Symmetrical Chest wall movement, Good air movement bilaterally, CTAB.  7. RRR, No Gallops, Rubs or Murmurs, No Parasternal Heave.No Leg edema  8. Positive Bowel  Sounds, Abdomen Soft, No tenderness, No organomegaly appriciated,No rebound -guarding or rigidity.  9.  No Cyanosis, Normal Skin Turgor, No Skin Rash or Bruise.  10. Good muscle tone,  joints appear normal , no effusions, Normal ROM.      Data Review:    CBC  Recent Labs Lab 12/05/15 2350  WBC 6.2  HGB 11.6*  HCT 35.8*  PLT 284  MCV 86.9  MCH 28.2  MCHC 32.4  RDW 14.2  LYMPHSABS 2.0  MONOABS 0.6  EOSABS 0.2  BASOSABS 0.1   ------------------------------------------------------------------------------------------------------------------  Chemistries   Recent Labs Lab 12/05/15 2350  NA 140  K 4.1  CL 111  CO2 24  GLUCOSE 221*  BUN 32*  CREATININE 1.55*  CALCIUM 8.8*  AST 16  ALT 11*  ALKPHOS 88  BILITOT 0.4   ------------------------------------------------------------------------------------------------------------------  Coagulation profile  Recent Labs Lab 12/05/15 2350  INR 0.95   ------------------------------------------------------------------------------------------------------------------- No results for input(s): DDIMER in the last 72 hours. -------------------------------------------------------------------------------------------------------------------  Cardiac Enzymes  Recent Labs Lab 12/05/15 2350  TROPONINI <0.03   ------------------------------------------------------------------------------------------------------------------    Component Value Date/Time   BNP 37.0 04/30/2015 0358     ---------------------------------------------------------------------------------------------------------------  Urinalysis   ----------------------------------------------------------------------------------------------------------------   Imaging Results:    Ct Head Code Stroke W/o Cm  Result Date: 12/06/2015 CLINICAL DATA:  Code stroke.  Constant left hand pain and numbness. EXAM: CT HEAD WITHOUT CONTRAST TECHNIQUE: Contiguous  axial images were obtained from the base of the skull through the vertex without intravenous contrast. COMPARISON:  None. FINDINGS: Scattered periventricular hypodensities  compatible with microvascular ischemic disease. Calcifications within the right basal ganglia. The gray-white differentiation is otherwise well maintained without CT evidence of acute large territory infarct. No intraparenchymal or extra-axial mass or hemorrhage. Normal size and configuration of the ventricles and basilar cisterns. No midline shift. Limited visualization the paranasal sinuses and mastoid air cells is normal. No air-fluid levels. Regional soft tissues appear normal. No displaced calvarial fracture. Note is made of hyperostosis frontalis. IMPRESSION: Microvascular ischemic disease without acute intracranial process. Critical Value/emergent results were called by telephone at the time of interpretation on 12/06/2015 at 12:07 am to Dr. Joseph Berkshire , who verbally acknowledged these results. Electronically Signed   By: Sandi Mariscal M.D.   On: 12/06/2015 00:09   My personal review of EKG: Normal sinus rhythm   Assessment & Plan:    Active Problems:   CVA (cerebral infarction)   Left hand weakness   1. Left hand weakness- patient able to move left upper extremity, but her left hand is in flexed position. Will admit the patient for stroke workup, including MRI/MRA brain, carotid ultrasound, echocardiogram. Check hemoglobin A1c, lipid profile. Continue aspirin 81 mg by mouth daily. Neurochecks every 2 hours. PTOT evaluation. 2. Diabetes mellitus- initiate sliding scale insulin with NovoLog, Levemir. Hold metformin, Jardiance. 3. Hypertension- blood pressure is stable, continue to hold Cardizem, Lasix for permissive hypertension 4. Hypothyroidism- continue Synthroid   DVT Prophylaxis-   Lovenox   AM Labs Ordered, also please review Full Orders  Family Communication: no family at bedside  Code Status:   DNR  Admission status: Observation  Time spent in minutes : 60 min   Jenna Ardoin S M.D on 12/06/2015 at 2:00 AM  Between 7am to 7pm - Pager - [phone]. After 7pm go to [email or web address] - password [facility]  [facility] - Office  [phone]

## 2015-12-06 NOTE — Evaluation (Signed)
Physical Therapy Evaluation Patient Details Name: Stephanie Combs MRN: [id number] DOB: [birth year] Today's Date: 12/06/2015   History of Present Illness  65 yo F admitted 12/05/2015 with L hand and leg weakness and numbness.  Head CT negative for stroke.  MRI negative for stroke.  PMH COPD, CHF, DM, HTN, anxiety, chronic back pain, DDD, hypothyroidism, on home O2 - 2.5L PRN, pedal edema, hernia repair.  Clinical Impression  Pt received in bed, and was agreeable to PT evaluation.  Pt expressed that she normally uses a walker for ambulation at home, but she is independent with ADL's.  During today's PT evaluation she demonstrates bed mobility, transfers and ambulation at modified independent level with use of the RW.  She states that her L hand is the most aggravating thing to her right now.  MMT does not seem to be consistent with observations or functional mobility. Pt observed washing her hands, and can move L hand well to do this thoroughly.  No further PT is recommended at this time as pt is at her baseline level of function.  PT will sign off.     Follow Up Recommendations No PT follow up    Equipment Recommendations  None recommended by PT    Recommendations for Other Services       Precautions / Restrictions Precautions Precautions: None Restrictions Weight Bearing Restrictions: No      Mobility  Bed Mobility Overal bed mobility: Modified Independent                Transfers Overall transfer level: Modified independent Equipment used: Rolling walker (2 wheeled)                Ambulation/Gait Ambulation/Gait assistance: Modified independent (Device/Increase time) Ambulation Distance (Feet): 70 Feet Assistive device: Rolling walker (2 wheeled) Gait Pattern/deviations: Wide base of support;Trunk flexed   Gait velocity interpretation: <1.8 ft/sec, indicative of risk for recurrent falls    Stairs            Wheelchair Mobility    Modified Rankin  (Stroke Patients Only)       Balance Overall balance assessment: Modified Independent                                           Pertinent Vitals/Pain Pain Assessment: No/denies pain (Pt states she has tingling in the L hand. )    Home Living   Living Arrangements: Alone Available Help at Discharge:  (dtr, and neighbor on the corner, neighbor's son, neighbor's niece comes on the weekend.  ) Type of Home: Mobile home Home Access: Ramped entrance     Home Layout: One level Home Equipment: Tub bench;Bedside commode;Walker - 4 wheels;Walker - standard;Cane - single point;Wheelchair - manual;Other (comment) (Pt has a sock aid to help get her compression socks on.  Pt has O2 at home. )      Prior Function     Gait / Transfers Assistance Needed: Pt states that she was using the walker with skiis.  Pt states she uses it all the time.   ADL's / Homemaking Assistance Needed: Pt states she is independent with dressing and bathing.  Pt states she is independent with household work.  Pt was still driving up until 3 months ago due to doing therapy with compression hose.  Pt's neighbors and friends from church have been helping pt with running errands -  pt goes with him.         Hand Dominance   Dominant Hand: Right    Extremity/Trunk Assessment   Upper Extremity Assessment: Defer to OT evaluation           Lower Extremity Assessment: Overall WFL for tasks assessed         Communication   Communication: No difficulties  Cognition Arousal/Alertness: Awake/alert Behavior During Therapy: WFL for tasks assessed/performed Overall Cognitive Status: Within Functional Limits for tasks assessed                      General Comments      Exercises        Assessment/Plan    PT Assessment Patent does not need any further PT services  PT Diagnosis Generalized weakness   PT Problem List    PT Treatment Interventions     PT Goals (Current goals can  be found in the Care Plan section) Acute Rehab PT Goals PT Goal Formulation: All assessment and education complete, DC therapy    Frequency     Barriers to discharge        Co-evaluation               End of Session Equipment Utilized During Treatment: Gait belt;Oxygen Activity Tolerance: Patient tolerated treatment well Patient left: in chair      Functional Assessment Tool Used: [employer]	[place] AM-PAC "6-clicks"  Functional Limitation: Mobility: Walking and moving around Mobility: Walking and Moving Around Current Status [phone]): At least 1 percent but less than 20 percent impaired, limited or restricted Mobility: Walking and Moving Around Goal Status [phone]): At least 1 percent but less than 20 percent impaired, limited or restricted Mobility: Walking and Moving Around Discharge Status [phone]): At least 1 percent but less than 20 percent impaired, limited or restricted    Time: 0958-1030 PT Time Calculation (min) (ACUTE ONLY): 32 min   Charges:   PT Evaluation $PT Eval Low Complexity: 1 Procedure PT Treatments $Gait Training: 8-22 mins   PT G Codes:   PT G-Codes **NOT FOR INPATIENT CLASS** Functional Assessment Tool Used: [employer] "6-clicks"  Functional Limitation: Mobility: Walking and moving around Mobility: Walking and Moving Around Current Status [phone]): At least 1 percent but less than 20 percent impaired, limited or restricted Mobility: Walking and Moving Around Goal Status [phone]): At least 1 percent but less than 20 percent impaired, limited or restricted Mobility: Walking and Moving Around Discharge Status [phone]): At least 1 percent but less than 20 percent impaired, limited or restricted    Beth Valincia Touch, PT, DPT X: [phone]

## 2015-12-06 NOTE — ED Notes (Signed)
Dr. Lama in with pt at this time 

## 2015-12-06 NOTE — ED Notes (Signed)
[place] paged @ [phone]

## 2015-12-06 NOTE — ED Notes (Signed)
Ems reported that pt stated she could not open her left hand without assistance, but ems reports they saw pt pick up her purse, pick up her cat, and close the door with the left hand without difficulty

## 2015-12-06 NOTE — Progress Notes (Signed)
Inserted SLP eval, but patient passed swallow screen, so D/C order.  Will page MD for diet order.

## 2015-12-06 NOTE — Discharge Summary (Signed)
Physician Discharge Summary  FLYNN LININGER SNK:[id number] DOB: Sep 21, 1950 DOA: 12/05/2015  PCP: Neale Burly, MD  Admit date: 12/05/2015 Discharge date: 12/06/2015  Time spent: 45 minutes  Recommendations for Outpatient Follow-up:  -Will be discharged home today. -Advised to follow up with PCP in 2 weeks.   Discharge Diagnoses:  Active Problems:   Left hand weakness   Discharge Condition: Stable and improved  Filed Weights   12/05/15 2351 12/06/15 0206  Weight: 120.7 kg (266 lb) 120.6 kg (265 lb 14 oz)    History of present illness:  As per Dr. Darrick Meigs on 7/27: Stephanie Combs  is a 65 y.o. female, History of COPD, CHF, diabetes mellitus, hypertension who came to the ED with sudden onset left hand weakness and numbness. Patient was taking a nap when the symptoms came on and woke patient from sleep. Patient also complains of numbness in the left leg. She took 2 aspirin at home before coming to the hospital." Was initiated and patient was seen by neurology on call Dr. Donivan Scull, after the examination and neurological evaluation it was felt the patient was not a TPA candidate, due to inability to clearly pin down onset of symptoms. Patient admitted for stroke workup. She denies chest pain, no shortness of breath. No nausea vomiting or diarrhea. CT head negative for stroke   Hospital Course:   Left Arm Weakness -MRI negative for CVA. -Per patient this still persists. -ECHO: Left ventricle: The cavity size was normal. Wall thickness was   increased in a pattern of moderate LVH. Systolic function was   vigorous. The estimated ejection fraction was in the range of 65%   to 70%. Wall motion was normal; there were no regional wall   motion abnormalities. Doppler parameters are consistent with   abnormal left ventricular relaxation (grade 1 diastolic   dysfunction). Doppler parameters are consistent with high   ventricular filling pressure. -Dopplers: Color duplex indicates moderate  heterogeneous and calcified plaque, with no hemodynamically significant stenosis by duplex criteria in the extracranial cerebrovascular circulation. -I am starting to suspect she may be malingering for drug-seeking purposes: she has been requesting narcotics and benzos, and in fact has been somewhat lethargic today after her "home xanax" dose was given. -Continue ASA. -Do not believe this to be a TIA given persistence of symptoms.  Procedures:  None   Consultations:  None  Discharge Instructions  Discharge Instructions    Diet - low sodium heart healthy    Complete by:  As directed   Increase activity slowly    Complete by:  As directed       Medication List    TAKE these medications   albuterol 108 (90 Base) MCG/ACT inhaler Commonly known as:  PROAIR HFA Inhale 1 puff into the lungs every 4 (four) hours as needed. For shortness of breath What changed:  how much to take  additional instructions   alprazolam 2 MG tablet Commonly known as:  XANAX Take 2 mg by mouth 3 (three) times daily as needed for sleep or anxiety. What changed:  Another medication with the same name was removed. Continue taking this medication, and follow the directions you see here.   aspirin EC 81 MG tablet Take 81 mg by mouth daily.   diltiazem 240 MG 24 hr capsule Commonly known as:  CARDIZEM CD Take 1 capsule (240 mg total) by mouth daily.   FISH OIL PO Take 1 capsule by mouth daily.   furosemide 20 MG tablet  Commonly known as:  LASIX Take 2 tablets (40 mg total) by mouth 2 (two) times daily.   gabapentin 400 MG capsule Commonly known as:  NEURONTIN Take 3 capsules (1,200 mg total) by mouth 2 (two) times daily. What changed:  how much to take   insulin aspart 100 UNIT/ML injection Commonly known as:  novoLOG Inject 20 Units into the skin 2 (two) times daily. Sliding scale.   insulin detemir 100 UNIT/ML injection Commonly known as:  LEVEMIR Inject 0.2 mLs (20 Units total) into  the skin at bedtime. What changed:  how much to take   JARDIANCE 25 MG Tabs tablet Generic drug:  empagliflozin Take 25 mg by mouth daily.   levothyroxine 125 MCG tablet Commonly known as:  SYNTHROID, LEVOTHROID Take 2 tablets (250 mcg total) by mouth daily before breakfast.   metFORMIN 500 MG tablet Commonly known as:  GLUCOPHAGE Take 500 mg by mouth 2 (two) times daily with a meal.   nystatin powder Generic drug:  nystatin Apply 1 g topically 2 (two) times daily.   ondansetron 8 MG disintegrating tablet Commonly known as:  ZOFRAN ODT Take 1 tablet (8 mg total) by mouth every 8 (eight) hours as needed for nausea or vomiting. What changed:  when to take this   oxyCODONE-acetaminophen 5-325 MG tablet Commonly known as:  PERCOCET/ROXICET Take 2 tablets by mouth every 6 (six) hours as needed for moderate pain. What changed:  how much to take  when to take this   potassium chloride 10 MEQ tablet Commonly known as:  K-DUR Take 20 mEq by mouth 2 (two) times daily.   ranitidine 150 MG tablet Commonly known as:  ZANTAC Take 150 mg by mouth daily.      Allergies  Allergen Reactions  . Dilaudid [Hydromorphone Hcl] Itching  . Actifed Cold-Allergy [Chlorpheniramine-Phenylephrine]     "I was sick and red and it didn't agree with me at all"  . Doxycycline Nausea And Vomiting  . Other Cough    Pt states she is allergic to ragweed and that she starts coughing and sneezing like crazy  . Penicillins Hives and Itching    Has patient had a PCN reaction causing immediate rash, facial/tongue/throat swelling, SOB or lightheadedness with hypotension: Yes Has patient had a PCN reaction causing severe rash involving mucus membranes or skin necrosis: No Has patient had a PCN reaction that required hospitalization Yes Has patient had a PCN reaction occurring within the last 10 years: No If all of the above answers are "NO", then may proceed with Cephalosporin use.   . Reglan  [Metoclopramide] Itching  . Valium [Diazepam] Itching  . Vistaril [Hydroxyzine Hcl] Itching   Follow-up Information    Neale Burly, MD. Schedule an appointment as soon as possible for a visit in 2 week(s).   Specialty:  Internal Medicine Contact information: [place] [phone]            The results of significant diagnostics from this hospitalization (including imaging, microbiology, ancillary and laboratory) are listed below for reference.    Significant Diagnostic Studies: Dg Chest 2 View  Result Date: 12/06/2015 CLINICAL DATA:  Stroke-like symptoms beginning last night with left-sided weakness and numbness. EXAM: CHEST  2 VIEW COMPARISON:  07/31/2015 and 02/22/2015 FINDINGS: Lungs are adequately inflated with subtle density in the right base which may be due to atelectasis or infection. Cardiomediastinal silhouette is within normal. There are mild degenerate changes of the spine. IMPRESSION: Mild  right basilar opacification likely atelectasis although cannot exclude infection. Electronically Signed   By: Marin Olp M.D.   On: 12/06/2015 08:10  Mr Jodene Nam Head Wo Contrast  Result Date: 12/06/2015 CLINICAL DATA:  Sudden onset left hand weakness and numbness. Numbness in the left leg. EXAM: MRI HEAD WITHOUT CONTRAST MRA HEAD WITHOUT CONTRAST TECHNIQUE: Multiplanar, multiecho pulse sequences of the brain and surrounding structures were obtained without intravenous contrast. Angiographic images of the head were obtained using MRA technique without contrast. COMPARISON:  Head CT 12/06/2015 FINDINGS: MRI HEAD FINDINGS There is no evidence of acute infarct, intracranial hemorrhage, mass, midline shift, or extra-axial fluid collection. Patchy to confluent T2 hyperintensities in the cerebral white matter bilaterally and pons are nonspecific but compatible with moderate chronic small vessel ischemic disease. There is mild cerebral atrophy. Orbits are  unremarkable. There is a small right mastoid effusion. The paranasal sinuses are clear. Major intracranial vascular flow voids are preserved. Hyperostosis frontalis interna is noted as well as ossification along the anterior falx. MRA HEAD FINDINGS The visualized distal vertebral arteries are patent to the basilar with the left being strongly dominant. PICA origins and SCA origins are patent. Basilar artery is widely patent. There is a small left posterior communicating artery. PCAs are patent without evidence of significant proximal stenosis. PCA branch vessels are not well evaluated. The internal carotid arteries are patent from skullbase to carotid termini without evidence of significant stenosis. The right A1 segment is hypoplastic. The left A1 segment is widely patent, and there is a patent anterior communicating artery. No evidence of significant A2 stenosis. M1 segments are widely patent. There is mild asymmetric attenuation and mild irregular narrowing of left M2 branch vessels compared to the right. No intracranial aneurysm is identified. IMPRESSION: 1. No acute intracranial abnormality. 2. Moderate chronic small vessel ischemic disease. 3. No evidence of major intracranial arterial occlusion or significant proximal stenosis. Mild left MCA branch vessel attenuation and irregularity. Electronically Signed   By: Logan Bores M.D.   On: 12/06/2015 08:48  Mr Brain Wo Contrast  Result Date: 12/06/2015 CLINICAL DATA:  Sudden onset left hand weakness and numbness. Numbness in the left leg. EXAM: MRI HEAD WITHOUT CONTRAST MRA HEAD WITHOUT CONTRAST TECHNIQUE: Multiplanar, multiecho pulse sequences of the brain and surrounding structures were obtained without intravenous contrast. Angiographic images of the head were obtained using MRA technique without contrast. COMPARISON:  Head CT 12/06/2015 FINDINGS: MRI HEAD FINDINGS There is no evidence of acute infarct, intracranial hemorrhage, mass, midline shift, or  extra-axial fluid collection. Patchy to confluent T2 hyperintensities in the cerebral white matter bilaterally and pons are nonspecific but compatible with moderate chronic small vessel ischemic disease. There is mild cerebral atrophy. Orbits are unremarkable. There is a small right mastoid effusion. The paranasal sinuses are clear. Major intracranial vascular flow voids are preserved. Hyperostosis frontalis interna is noted as well as ossification along the anterior falx. MRA HEAD FINDINGS The visualized distal vertebral arteries are patent to the basilar with the left being strongly dominant. PICA origins and SCA origins are patent. Basilar artery is widely patent. There is a small left posterior communicating artery. PCAs are patent without evidence of significant proximal stenosis. PCA branch vessels are not well evaluated. The internal carotid arteries are patent from skullbase to carotid termini without evidence of significant stenosis. The right A1 segment is hypoplastic. The left A1 segment is widely patent, and there is a patent anterior communicating artery. No evidence of significant A2 stenosis. M1 segments are  widely patent. There is mild asymmetric attenuation and mild irregular narrowing of left M2 branch vessels compared to the right. No intracranial aneurysm is identified. IMPRESSION: 1. No acute intracranial abnormality. 2. Moderate chronic small vessel ischemic disease. 3. No evidence of major intracranial arterial occlusion or significant proximal stenosis. Mild left MCA branch vessel attenuation and irregularity. Electronically Signed   By: Logan Bores M.D.   On: 12/06/2015 08:48  US Carotid Bilateral  Result Date: 12/06/2015 CLINICAL DATA:  65 year old female with a history of weakness. Cardiovascular risk factors include hypertension, stroke/ TIA, known coronary artery disease, diabetes, tobacco use EXAM: BILATERAL CAROTID DUPLEX ULTRASOUND TECHNIQUE: Pearline Cables scale imaging, color Doppler and  duplex ultrasound were performed of bilateral carotid and vertebral arteries in the neck. COMPARISON:  No prior duplex FINDINGS: Criteria: Quantification of carotid stenosis is based on velocity parameters that correlate the residual internal carotid diameter with NASCET-based stenosis levels, using the diameter of the distal internal carotid lumen as the denominator for stenosis measurement. The following velocity measurements were obtained: RIGHT ICA:  Systolic 95 cm/sec, Diastolic 16 cm/sec CCA:  86 cm/sec SYSTOLIC ICA/CCA RATIO:  1.1 ECA:  141 cm/sec LEFT ICA:  Systolic 94 cm/sec, Diastolic 28 cm/sec CCA:  88 cm/sec SYSTOLIC ICA/CCA RATIO:  1.3 ECA:  111 cm/sec Right Brachial SBP: Not acquired Left Brachial SBP: Not acquired RIGHT CAROTID ARTERY: No significant calcifications of the right common carotid artery. Intermediate waveform maintained. Heterogeneous and partially calcified plaque at the right carotid bifurcation. No significant lumen shadowing. Low resistance waveform of the right ICA. No significant tortuosity. RIGHT VERTEBRAL ARTERY: Antegrade flow with low resistance waveform. LEFT CAROTID ARTERY: No significant calcifications of the left common carotid artery. Intermediate waveform maintained. Heterogeneous and partially calcified plaque at the left carotid bifurcation without significant lumen shadowing. Low resistance waveform of the left ICA. No significant tortuosity. LEFT VERTEBRAL ARTERY:  Antegrade flow with low resistance waveform. IMPRESSION: Color duplex indicates moderate heterogeneous and calcified plaque, with no hemodynamically significant stenosis by duplex criteria in the extracranial cerebrovascular circulation. Signed, Dulcy Fanny. Earleen Newport, DO [facility] Electronically Signed   By: Corrie Mckusick D.O.   On: 12/06/2015 08:04  Ct Head Code Stroke W/o Cm  Result Date: 12/06/2015 CLINICAL DATA:  Code stroke.  Constant left hand  pain and numbness. EXAM: CT HEAD WITHOUT CONTRAST TECHNIQUE: Contiguous axial images were obtained from the base of the skull through the vertex without intravenous contrast. COMPARISON:  None. FINDINGS: Scattered periventricular hypodensities compatible with microvascular ischemic disease. Calcifications within the right basal ganglia. The gray-white differentiation is otherwise well maintained without CT evidence of acute large territory infarct. No intraparenchymal or extra-axial mass or hemorrhage. Normal size and configuration of the ventricles and basilar cisterns. No midline shift. Limited visualization the paranasal sinuses and mastoid air cells is normal. No air-fluid levels. Regional soft tissues appear normal. No displaced calvarial fracture. Note is made of hyperostosis frontalis. IMPRESSION: Microvascular ischemic disease without acute intracranial process. Critical Value/emergent results were called by telephone at the time of interpretation on 12/06/2015 at 12:07 am to Dr. Joseph Berkshire , who verbally acknowledged these results. Electronically Signed   By: Sandi Mariscal M.D.   On: 12/06/2015 00:09   Microbiology: No results found for this or any previous visit (from the past 240 hour(s)).   Labs: Basic Metabolic Panel:  Recent Labs Lab 12/05/15 2350  NA 140  K 4.1  CL 111  CO2 24  GLUCOSE 221*  BUN 32*  CREATININE 1.55*  CALCIUM 8.8*   Liver Function Tests:  Recent Labs Lab 12/05/15 2350  AST 16  ALT 11*  ALKPHOS 88  BILITOT 0.4  PROT 6.9  ALBUMIN 4.0   No results for input(s): LIPASE, AMYLASE in the last 168 hours. No results for input(s): AMMONIA in the last 168 hours. CBC:  Recent Labs Lab 12/05/15 2350  WBC 6.2  NEUTROABS 3.4  HGB 11.6*  HCT 35.8*  MCV 86.9  PLT 284   Cardiac Enzymes:  Recent Labs Lab 12/05/15 2350  TROPONINI <0.03   BNP: BNP (last 3 results)  Recent Labs  03/24/15 1538 04/30/15 0358  BNP 170.0* 37.0    ProBNP  (last 3 results) No results for input(s): PROBNP in the last 8760 hours.  CBG:  Recent Labs Lab 12/06/15 0348 12/06/15 0828 12/06/15 1140 12/06/15 1627  GLUCAP 220* 174* 263* 179*       Signed:  HERNANDEZ ACOSTA,Sabra Sessler  [facility] Pager: [phone] 12/06/2015, 5:28 PM

## 2015-12-06 NOTE — Progress Notes (Signed)
Occupational Therapy Evaluation Patient Details Name: Stephanie Combs MRN: [id number] DOB: 1950/07/07 Today's Date: 12/06/2015    History of Present Illness 65 yo F admitted 12/05/2015 with L hand and leg weakness and numbness.  Head CT negative for stroke.  MRI negative for stroke.  PMH COPD, CHF, DM, HTN, anxiety, chronic back pain, DDD, hypothyroidism, on home O2 - 2.5L PRN, pedal edema, hernia repair.   Clinical Impression   Pt seen this am, awake, alert, and oriented. Pt reports main concern is left hand, as she has been unable to extend fingers or wrist and use hand. However, upon OT observation and ADL completion, pt uses left hand volitionally with minimal difficulty. With formal MMT pt demonstrates wrist extension of 0/5 and digit extension of 50-75% range. If pt truly has 0/5 strength in wrist extensors, resting hand splint or wrist cock-up splint recommended, however further assessment needed prior to providing a splint. Educated pt on self-ROM exercises to perform, as well as stretches for the LUE. Pt tone is normal, sensation and coordination are intact. Will continue to see while in acute care to determine extent of weakness in LUE, recommend outpatient OT services if weakness and lack of coordination continue, if pt is interested.      Follow Up Recommendations  Outpatient OT (if LUE deficits continue and pt is interested)    Equipment Recommendations  None recommended by OT       Precautions / Restrictions Precautions Precautions: None Restrictions Weight Bearing Restrictions: No      Mobility Bed Mobility Overal bed mobility: Modified Independent                Transfers Overall transfer level: Modified independent Equipment used: Rolling walker (2 wheeled)                  Balance Overall balance assessment: Modified Independent                                          ADL Overall ADL's : Needs assistance/impaired      Grooming: Wash/dry hands;Modified independent;Standing                   Toilet Transfer: Modified Independent;Grab bars;RW   Toileting- Clothing Manipulation and Hygiene: Modified independent;Sitting/lateral lean;Sit to/from stand Toileting - Clothing Manipulation Details (indicate cue type and reason): Pt used left hand for hygiene tasks     Functional mobility during ADLs: Modified independent;Rolling walker General ADL Comments: Pt reports inability to extend wrist or digits, however is able to use left hand functionally during ADL tasks.      Vision Vision Assessment?: No apparent visual deficits          Pertinent Vitals/Pain Pain Assessment: No/denies pain     Hand Dominance Right   Extremity/Trunk Assessment Upper Extremity Assessment Upper Extremity Assessment: LUE deficits/detail LUE Deficits / Details: Pt reports tingling in LUE from hand to elbow. Pt reports in ability to use left hand or extend fingers. On objective MMT: shoulder 4-/5, elbow 4/5, wrist flexors 4-/5, wrist extensors 0/5. Pt able to achieve full composite flexion, however only able to achieve 50-75% composite extension.  (Pt able to use left hand during functional tasks) LUE Coordination: decreased fine motor   Lower Extremity Assessment Lower Extremity Assessment: Defer to PT evaluation       Communication Communication Communication: No difficulties  Cognition Arousal/Alertness: Awake/alert Behavior During Therapy: WFL for tasks assessed/performed Overall Cognitive Status: Within Functional Limits for tasks assessed                                Home Living Family/patient expects to be discharged to:: Private residence Living Arrangements: Alone Available Help at Discharge: Other (Comment) (various people check in on pt)               Bathroom Shower/Tub: Tub/shower unit   Bathroom Toilet: Handicapped height     Home Equipment: Tub bench;Bedside  commode;Walker - 4 wheels;Walker - standard;Cane - single point;Wheelchair - manual;Other (comment) (O2 at home)          Prior Functioning/Environment Level of Independence: Independent with assistive device(s)  Gait / Transfers Assistance Needed: Pt states that she was using the walker with skiis.  Pt states she uses it all the time.  ADL's / Homemaking Assistance Needed: Pt reports independence in all ADL tasks and was driving until approximately 3 months ago. Pt has various people to assist with running errands, and calls someone to be at her house during bathing in case of a fall.         OT Diagnosis: Hemiplegia non-dominant side   OT Problem List: Decreased strength;Decreased range of motion;Decreased activity tolerance;Impaired UE functional use   OT Treatment/Interventions: Self-care/ADL training;Therapeutic exercise;DME and/or AE instruction;Therapeutic activities;Patient/family education    OT Goals(Current goals can be found in the care plan section) Acute Rehab OT Goals Patient Stated Goal: To be able to use my left hand OT Goal Formulation: With patient Time For Goal Achievement: 12/20/15 Potential to Achieve Goals: Good  OT Frequency: Min 2X/week    End of Session Equipment Utilized During Treatment: Gait belt;Rolling walker  Activity Tolerance: Patient tolerated treatment well Patient left: in bed;with call bell/phone within reach   Time: 0817-0859 OT Time Calculation (min): 42 min Charges:  OT General Charges $OT Visit: 1 Procedure OT Evaluation $OT Eval Low Complexity: 1 Procedure G-Codes: OT G-codes **NOT FOR INPATIENT CLASS** Functional Assessment Tool Used: clinical judgement Functional Limitation: Self care Self Care Current Status (Z6109): At least 1 percent but less than 20 percent impaired, limited or restricted Self Care Goal Status (U0454): At least 1 percent but less than 20 percent impaired, limited or restricted   Guadelupe Sabin, OTR/L   [phone] 12/06/2015, 2:12 PM

## 2015-12-06 NOTE — Plan of Care (Signed)
Problem: Coping: Goal: Ability to identify strategies to decrease anxiety will improve Outcome: Progressing Pt states she is depressed. Pt references today as the death of a close relative.  Problem: Health Behavior/Discharge Planning: Goal: Ability to manage health-related needs will improve Outcome: Progressing Pt anxious. Given Xanax to relieve anxiety.

## 2015-12-06 NOTE — Care Management Obs Status (Signed)
[place] NOTIFICATION   Patient Details  Name: CELESTER MORGAN MRN: [id number] Date of Birth: Nov 08, 1950   Medicare Observation Status Notification Given:  Yes    Pearl Berlinger, Chauncey Reading, RN 12/06/2015, 11:17 AM

## 2015-12-06 NOTE — ED Notes (Signed)
Report given to RN on 300 

## 2015-12-06 NOTE — Progress Notes (Signed)
Patient discharged home with discharge papers, Iv removed and site intact.  Patient sent home with all personal belongings.

## 2015-12-06 NOTE — Progress Notes (Signed)
CODE STROKE   BEEPER  1145PM PT. IN CT  1159PM PT. OUT OF CT 1204AM SOC CALLED  1203 RADIOLOGIST 1206

## 2015-12-07 LAB — HEMOGLOBIN A1C
Hgb A1c MFr Bld: 9.6 % — ABNORMAL HIGH (ref 4.8–5.6)
Mean Plasma Glucose: 229 mg/dL

## 2015-12-17 DIAGNOSIS — I5032 Chronic diastolic (congestive) heart failure: Secondary | ICD-10-CM | POA: Diagnosis not present

## 2015-12-17 DIAGNOSIS — N183 Chronic kidney disease, stage 3 (moderate): Secondary | ICD-10-CM | POA: Diagnosis not present

## 2015-12-17 DIAGNOSIS — E1141 Type 2 diabetes mellitus with diabetic mononeuropathy: Secondary | ICD-10-CM | POA: Diagnosis not present

## 2015-12-17 DIAGNOSIS — I6789 Other cerebrovascular disease: Secondary | ICD-10-CM | POA: Diagnosis not present

## 2015-12-20 ENCOUNTER — Ambulatory Visit (HOSPITAL_COMMUNITY): Payer: Medicare Other

## 2015-12-20 ENCOUNTER — Encounter (HOSPITAL_COMMUNITY): Payer: Self-pay

## 2015-12-20 DIAGNOSIS — N183 Chronic kidney disease, stage 3 (moderate): Secondary | ICD-10-CM | POA: Diagnosis not present

## 2015-12-20 DIAGNOSIS — I6789 Other cerebrovascular disease: Secondary | ICD-10-CM | POA: Diagnosis not present

## 2015-12-20 DIAGNOSIS — R531 Weakness: Secondary | ICD-10-CM | POA: Diagnosis not present

## 2015-12-20 DIAGNOSIS — E1141 Type 2 diabetes mellitus with diabetic mononeuropathy: Secondary | ICD-10-CM | POA: Diagnosis not present

## 2015-12-20 DIAGNOSIS — I5032 Chronic diastolic (congestive) heart failure: Secondary | ICD-10-CM | POA: Diagnosis not present

## 2015-12-21 ENCOUNTER — Encounter (HOSPITAL_COMMUNITY): Payer: Medicare Other

## 2015-12-24 DIAGNOSIS — I6789 Other cerebrovascular disease: Secondary | ICD-10-CM | POA: Diagnosis not present

## 2015-12-24 DIAGNOSIS — I5032 Chronic diastolic (congestive) heart failure: Secondary | ICD-10-CM | POA: Diagnosis not present

## 2015-12-24 DIAGNOSIS — E1141 Type 2 diabetes mellitus with diabetic mononeuropathy: Secondary | ICD-10-CM | POA: Diagnosis not present

## 2015-12-24 DIAGNOSIS — R531 Weakness: Secondary | ICD-10-CM | POA: Diagnosis not present

## 2015-12-24 DIAGNOSIS — N183 Chronic kidney disease, stage 3 (moderate): Secondary | ICD-10-CM | POA: Diagnosis not present

## 2015-12-26 DIAGNOSIS — R531 Weakness: Secondary | ICD-10-CM | POA: Diagnosis not present

## 2015-12-26 DIAGNOSIS — N183 Chronic kidney disease, stage 3 (moderate): Secondary | ICD-10-CM | POA: Diagnosis not present

## 2015-12-26 DIAGNOSIS — I6789 Other cerebrovascular disease: Secondary | ICD-10-CM | POA: Diagnosis not present

## 2015-12-26 DIAGNOSIS — I5032 Chronic diastolic (congestive) heart failure: Secondary | ICD-10-CM | POA: Diagnosis not present

## 2015-12-26 DIAGNOSIS — E1141 Type 2 diabetes mellitus with diabetic mononeuropathy: Secondary | ICD-10-CM | POA: Diagnosis not present

## 2015-12-27 DIAGNOSIS — N183 Chronic kidney disease, stage 3 (moderate): Secondary | ICD-10-CM | POA: Diagnosis not present

## 2015-12-27 DIAGNOSIS — R531 Weakness: Secondary | ICD-10-CM | POA: Diagnosis not present

## 2015-12-27 DIAGNOSIS — I5032 Chronic diastolic (congestive) heart failure: Secondary | ICD-10-CM | POA: Diagnosis not present

## 2015-12-27 DIAGNOSIS — E1141 Type 2 diabetes mellitus with diabetic mononeuropathy: Secondary | ICD-10-CM | POA: Diagnosis not present

## 2015-12-27 DIAGNOSIS — I6789 Other cerebrovascular disease: Secondary | ICD-10-CM | POA: Diagnosis not present

## 2016-01-01 DIAGNOSIS — J449 Chronic obstructive pulmonary disease, unspecified: Secondary | ICD-10-CM | POA: Diagnosis not present

## 2016-01-03 DIAGNOSIS — E1141 Type 2 diabetes mellitus with diabetic mononeuropathy: Secondary | ICD-10-CM | POA: Diagnosis not present

## 2016-01-03 DIAGNOSIS — I5032 Chronic diastolic (congestive) heart failure: Secondary | ICD-10-CM | POA: Diagnosis not present

## 2016-01-03 DIAGNOSIS — I6789 Other cerebrovascular disease: Secondary | ICD-10-CM | POA: Diagnosis not present

## 2016-01-03 DIAGNOSIS — N183 Chronic kidney disease, stage 3 (moderate): Secondary | ICD-10-CM | POA: Diagnosis not present

## 2016-01-03 DIAGNOSIS — R531 Weakness: Secondary | ICD-10-CM | POA: Diagnosis not present

## 2016-01-09 DIAGNOSIS — N183 Chronic kidney disease, stage 3 (moderate): Secondary | ICD-10-CM | POA: Diagnosis not present

## 2016-01-09 DIAGNOSIS — E1141 Type 2 diabetes mellitus with diabetic mononeuropathy: Secondary | ICD-10-CM | POA: Diagnosis not present

## 2016-01-09 DIAGNOSIS — R531 Weakness: Secondary | ICD-10-CM | POA: Diagnosis not present

## 2016-01-09 DIAGNOSIS — I5032 Chronic diastolic (congestive) heart failure: Secondary | ICD-10-CM | POA: Diagnosis not present

## 2016-01-09 DIAGNOSIS — I6789 Other cerebrovascular disease: Secondary | ICD-10-CM | POA: Diagnosis not present

## 2016-01-17 DIAGNOSIS — N183 Chronic kidney disease, stage 3 (moderate): Secondary | ICD-10-CM | POA: Diagnosis not present

## 2016-01-17 DIAGNOSIS — I6789 Other cerebrovascular disease: Secondary | ICD-10-CM | POA: Diagnosis not present

## 2016-01-17 DIAGNOSIS — E1141 Type 2 diabetes mellitus with diabetic mononeuropathy: Secondary | ICD-10-CM | POA: Diagnosis not present

## 2016-01-17 DIAGNOSIS — I5032 Chronic diastolic (congestive) heart failure: Secondary | ICD-10-CM | POA: Diagnosis not present

## 2016-01-17 DIAGNOSIS — R531 Weakness: Secondary | ICD-10-CM | POA: Diagnosis not present

## 2016-01-21 DIAGNOSIS — M5416 Radiculopathy, lumbar region: Secondary | ICD-10-CM | POA: Diagnosis not present

## 2016-01-21 DIAGNOSIS — Z79891 Long term (current) use of opiate analgesic: Secondary | ICD-10-CM | POA: Diagnosis not present

## 2016-02-01 DIAGNOSIS — Z79891 Long term (current) use of opiate analgesic: Secondary | ICD-10-CM | POA: Diagnosis not present

## 2016-02-01 DIAGNOSIS — J449 Chronic obstructive pulmonary disease, unspecified: Secondary | ICD-10-CM | POA: Diagnosis not present

## 2016-02-01 DIAGNOSIS — M5416 Radiculopathy, lumbar region: Secondary | ICD-10-CM | POA: Diagnosis not present

## 2016-02-14 DIAGNOSIS — N183 Chronic kidney disease, stage 3 (moderate): Secondary | ICD-10-CM | POA: Diagnosis not present

## 2016-02-14 DIAGNOSIS — I5032 Chronic diastolic (congestive) heart failure: Secondary | ICD-10-CM | POA: Diagnosis not present

## 2016-02-14 DIAGNOSIS — E1141 Type 2 diabetes mellitus with diabetic mononeuropathy: Secondary | ICD-10-CM | POA: Diagnosis not present

## 2016-02-14 DIAGNOSIS — G458 Other transient cerebral ischemic attacks and related syndromes: Secondary | ICD-10-CM | POA: Diagnosis not present

## 2016-02-27 DIAGNOSIS — M5416 Radiculopathy, lumbar region: Secondary | ICD-10-CM | POA: Diagnosis not present

## 2016-02-27 DIAGNOSIS — M818 Other osteoporosis without current pathological fracture: Secondary | ICD-10-CM | POA: Diagnosis not present

## 2016-02-27 DIAGNOSIS — R5383 Other fatigue: Secondary | ICD-10-CM | POA: Diagnosis not present

## 2016-02-27 DIAGNOSIS — E559 Vitamin D deficiency, unspecified: Secondary | ICD-10-CM | POA: Diagnosis not present

## 2016-02-27 DIAGNOSIS — E538 Deficiency of other specified B group vitamins: Secondary | ICD-10-CM | POA: Diagnosis not present

## 2016-02-27 DIAGNOSIS — Z79899 Other long term (current) drug therapy: Secondary | ICD-10-CM | POA: Diagnosis not present

## 2016-02-29 ENCOUNTER — Telehealth (HOSPITAL_COMMUNITY): Payer: Self-pay | Admitting: Physical Therapy

## 2016-02-29 NOTE — Telephone Encounter (Signed)
family member having kidney stone surgery. She decieded to keep this apptment and not go with the family to the hospital. NF 02/29/16

## 2016-03-02 DIAGNOSIS — J449 Chronic obstructive pulmonary disease, unspecified: Secondary | ICD-10-CM | POA: Diagnosis not present

## 2016-03-04 ENCOUNTER — Telehealth (HOSPITAL_COMMUNITY): Payer: Self-pay | Admitting: Physical Therapy

## 2016-03-04 NOTE — Telephone Encounter (Signed)
Pt called back states she has 4 blister that look like the will pop and Lymphedema in both legs per Barton Fanny [[id number]] NF 02/29/16 1:28 family member having kidney stone surgery. She decieded to keep this apptment and not go with the family to the hospital. NF 02/29/16  1:33 pm

## 2016-03-05 ENCOUNTER — Ambulatory Visit (HOSPITAL_COMMUNITY): Payer: Medicare Other | Attending: Neurology | Admitting: Physical Therapy

## 2016-03-05 DIAGNOSIS — M79661 Pain in right lower leg: Secondary | ICD-10-CM | POA: Insufficient documentation

## 2016-03-05 DIAGNOSIS — R262 Difficulty in walking, not elsewhere classified: Secondary | ICD-10-CM | POA: Diagnosis present

## 2016-03-05 DIAGNOSIS — I890 Lymphedema, not elsewhere classified: Secondary | ICD-10-CM | POA: Insufficient documentation

## 2016-03-05 DIAGNOSIS — M6281 Muscle weakness (generalized): Secondary | ICD-10-CM | POA: Diagnosis present

## 2016-03-05 DIAGNOSIS — M79605 Pain in left leg: Secondary | ICD-10-CM | POA: Insufficient documentation

## 2016-03-05 NOTE — Therapy (Signed)
[place], [name], [address] Phone: [phone]   Fax:  [phone]  Physical Therapy Evaluation  Patient Details  Name: Stephanie Combs MRN: [id number] Date of Birth: [birth year] Referring Provider: Barton Fanny  Encounter Date: 03/05/2016      PT End of Session - 03/05/16 1223    Visit Number 1   Number of Visits 12   Date for PT Re-Evaluation 04/04/16   Authorization Type [employer] medicare   Authorization - Visit Number 1   Authorization - Number of Visits 12   PT Start Time 1000   PT Stop Time 1120   PT Time Calculation (min) 80 min   Activity Tolerance Patient tolerated treatment well   Behavior During Therapy [facility] for tasks assessed/performed      Past Medical History:  Diagnosis Date  . Anxiety   . CHF (congestive heart failure) (HCC)    Diastolic  . Chronic back pain   . COPD (chronic obstructive pulmonary disease) ([place])   . Degenerative disk disease   . Diabetes mellitus without complication ([place])   . Hypertension   . Hypothyroidism   . On home O2    2.5 L N/C prn  . Pedal edema     Past Surgical History:  Procedure Laterality Date  . HERNIA REPAIR    . TUBAL LIGATION      There were no vitals filed for this visit.       Subjective Assessment - 03/05/16 0952    Subjective Pt states that she has blisters on her toes.  She states that her feet started swelling back up about a month ago.  She tried to work with them but they have not healed therefore she is being referred to physical therapy for wound and lymphedema therapy.     Pertinent History PT has been hospitalized for cellulitis; COPD and weakness; Hx of HTN, DM, Asthma, COPD    How long can you sit comfortably? no difficulty    How long can you walk comfortably? Ambulates with rolling walker; becomes SOB walking from waiting room to lymphedema room which is    Currently in Pain? Yes   Pain Location Foot   Pain Orientation  Right;Left   Pain Descriptors / Indicators Numbness;Throbbing   Pain Type Chronic pain   Pain Radiating Towards to knee area    Pain Onset More than a month ago   Pain Frequency Constant   Aggravating Factors  walking    Pain Relieving Factors not sure    Effect of Pain on Daily Activities increase            [facility] PT Assessment - 03/05/16 0001      Assessment   Medical Diagnosis nonhealing wounds with lymphedema   Referring Provider Barton Fanny   Onset Date/Surgical Date 02/04/16   Hand Dominance Right   Next MD Visit unscheduled   Prior Therapy in the past for cellulitis and lymphedema      Precautions   Precautions None     Restrictions   Weight Bearing Restrictions No     Balance Screen   Has the patient fallen in the past 6 months No   Has the patient had a decrease in activity level because of a fear of falling?  No   Is the patient reluctant to leave their home because of a fear of falling?  No     Home Ecologist residence  Prior Function   Level of Independence Independent with basic ADLs   Vocation Retired   Leisure dancing      Cognition   Overall Cognitive Status Within Functional Limits for tasks assessed     Observation/Other Assessments   Other Surveys  --  Lymphedema life impact scale 60/100           LYMPHEDEMA/ONCOLOGY QUESTIONNAIRE - 03/05/16 1015      Date Lymphedema/Swelling Started   Date (P)  02/04/16  for this episode      What other symptoms do you have   Are you Having Heaviness or Tightness (P)  Yes   Are you having Pain (P)  Yes   Are you having pitting edema (P)  Yes   Is it Hard or Difficult finding clothes that fit (P)  Yes   Do you have infections (P)  No   Stemmer Sign (P)  Yes     Lymphedema Stage   Stage (P)  STAGE 2 SPONTANEOUSLY IRREVERSIBLE     Lymphedema Assessments   Lymphedema Assessments (P)  Lower extremities     Right Lower Extremity Lymphedema   At  Midpatella/Popliteal Crease (P)  48.8 cm   30 cm Proximal to Floor at Lateral Plantar Foot (P)  57.1 cm   20 cm Proximal to Floor at Lateral Plantar Foot (P)  51.'3 1   10 '$ cm Proximal to Floor at Lateral Malleoli (P)  35.3 cm   Circumference of ankle/heel (P)  38.2 cm.   5 cm Proximal to 1st MTP Joint (P)  28.5 cm   Across MTP Joint (P)  29 cm   Around Proximal Great Toe (P)  12.3 cm     Left Lower Extremity Lymphedema   30 cm Proximal to Floor at Lateral Plantar Foot (P)  53.8 cm   20 cm Proximal to Floor at Lateral Plantar Foot (P)  48.2 cm   10 cm Proximal to Floor at Lateral Malleoli (P)  32.2 cm   Circumference of ankle/heel (P)  36.7 cm.   5 cm Proximal to 1st MTP Joint (P)  26.9 cm   Across MTP Joint (P)  27.6 cm   Around Proximal Great Toe (P)  11.3 cm                [facility] Adult PT Treatment/Exercise - 03/05/16 0001      Manual Therapy   Manual Therapy Edema management;Manual Lymphatic Drainage (MLD);Compression Bandaging   Manual therapy comments done seperate from all other aspects of treatment    Manual Lymphatic Drainage (MLD) to include:  supraclavicular, deep and superficial abdominal,routing fluid using inguinal/axillary anastomosis as well as anterior aspect of LE.  Posterior aspect was not completed due to time restraints.    Compression Bandaging toe wrapping bilaterally followed by profore application                 PT Education - 03/05/16 1222    Education provided Yes   Education Details The importance of washing feet and LE everyday to prevent cellulitis; the importance of using her compression hose everyday.    Person(s) Educated Patient   Comprehension Verbalized understanding;Returned demonstration          PT Short Term Goals - 03/05/16 1305      PT SHORT TERM GOAL #1   Title Pt pain in her feet  to decrease to no greater than a 5/10 to allow pt to ambulate with rolling walker and O2 with rolling  walker.    Time 2   Period Weeks    Status New     PT SHORT TERM GOAL #2   Title Pt to have no redness or signs of infection in her feet or legs    Time 1   Period Weeks   Status New     PT SHORT TERM GOAL #3   Title Pt to have acquired a long sponge to be able to cleanse lower legs and feet for decreased risk of infection    Time 2   Period Weeks           PT Long Term Goals - 03/05/16 1309      PT LONG TERM GOAL #1   Title Pt fluid in bilateral LE to have decreased by 3 cm to allow pt to be able to don her compression stockings    Time 4   Period Weeks   Status New     PT LONG TERM GOAL #2   Title Pt pain in bilateral LE to be no greater than a 3/10 to allow pt to ambulate with her walker and O2 for 339f without stopping  to improved commuinty ambulation    Time 4   Period Weeks   Status New     PT LONG TERM GOAL #3   Title Pt to have acquired a compression pump and be using twice a day as instructed for maintanence phase of lymphedema.    Time 4   Period Weeks               Plan - 03/05/16 1226    Clinical Impression Statement (P)  Ms. NBilliteris a 65yo female who has been referred to skilled physical therapy due to having blisters on her toes and acute exacerbation of her  lymphedema bilaterally.  Treatment was started 15 minutes late due to patient asking if we had anything to eat or drink due to her blood sugars being low; pt was given a diet sprite and muffin.  Pt LE was washed with significant dirt being taken off both pt feet and lower legs.  Therapist stressed the importance of washing pt feet and legs on a daily basis to prevent cellulitis.   Pt Rt second toe is red but no blisters are noted; Lt LE has areas on the plantar aspect of her great toe and dorsal aspect of her second toe where it appears blisters were and have already popped.  Pt right LE demonstrates increased edema over the Lt.  Pt will benefit from skilled physical therapy for both manual decongestive techniques as well as  multilayer compression bandaging.  Pt will also benefit from education on proper self massaging techniques as what pt shows therapist is a squeezing technique.     Rehab Potential (P)  Good   PT Frequency (P)  3x / week   PT Duration (P)  4 weeks   PT Treatment/Interventions (P)  ADLs/Self Care Home Management;Patient/family education;Manual techniques;Manual lymph drainage;Compression bandaging   PT Next Visit Plan (P)  Complete manal techniques both anteriorly and posteriorly; attempt to see why pt never recieved her compression pump, emphasis the need to wear compression garment everyday.    Consulted and Agree with Plan of Care (P)  Patient      Patient will benefit from skilled therapeutic intervention in order to improve the following deficits and impairments:  (P) Pain, Increased edema, Difficulty walking, Obesity, Decreased activity tolerance  Visit Diagnosis: Lymphedema, not elsewhere  classified  Muscle weakness (generalized)  Difficulty in walking, not elsewhere classified  Pain in right lower leg  Pain in left leg      G-Codes - March 10, 2016 1313    Functional Assessment Tool Used lymph impact questionaire    Functional Limitation Other PT primary   Other PT Primary Current Status (T4707) At least 60 percent but less than 80 percent impaired, limited or restricted   Other PT Primary Goal Status (A1518) At least 40 percent but less than 60 percent impaired, limited or restricted       Problem List Patient Active Problem List   Diagnosis Date Noted  . Left hand weakness 12/06/2015  . CAP (community acquired pneumonia) 07/25/2015  . Pressure ulcer 05/02/2015  . Hyponatremia 05/01/2015  . Acute-on-chronic kidney injury ([place]) 05/01/2015  . Diabetes mellitus with hyperglycemia, with long-term current use of insulin ([place]) 05/01/2015  . COPD exacerbation ([place]) 04/30/2015  . Cellulitis of foot, right 03/24/2015  . Elevated d-dimer 03/24/2015  . Peripheral edema 12/04/2014   . Bilateral edema of lower extremity   . Diabetic ulcer of right great toe ([place])   . Foot ulcer due to secondary DM ([place]) 11/07/2014  . Diabetic ulcer of right foot ([place]) 11/07/2014  . Edema of lower extremity 05/27/2013  . Headache 05/26/2013  . CKD (chronic kidney disease), stage III 04/14/2013  . Leukocytosis 04/14/2013  . Anemia in CKD (chronic kidney disease) 04/14/2013  . Dyspnea 04/14/2013  . Chronic diastolic CHF (congestive heart failure) ([place])   . COPD (chronic obstructive pulmonary disease) ([place]) 08/07/2012  . Morbid obesity ([place]) 08/07/2012  . DM type 2 (diabetes mellitus, type 2) ([place]) 08/07/2012  . HTN (hypertension), benign 08/07/2012  . Hypothyroidism (acquired) 08/07/2012    Rayetta Humphrey, PT CLT [phone] 2016-03-10, 1:14 PM  [place] [address] [place], [name], [address] Phone: [phone]   Fax:  [phone]  Name: Stephanie Combs MRN: [id number] Date of Birth: April 29, 1951

## 2016-03-06 ENCOUNTER — Ambulatory Visit (HOSPITAL_COMMUNITY): Payer: Medicare Other | Admitting: Physical Therapy

## 2016-03-06 DIAGNOSIS — I890 Lymphedema, not elsewhere classified: Secondary | ICD-10-CM | POA: Diagnosis not present

## 2016-03-06 DIAGNOSIS — M79605 Pain in left leg: Secondary | ICD-10-CM | POA: Diagnosis not present

## 2016-03-06 DIAGNOSIS — M79661 Pain in right lower leg: Secondary | ICD-10-CM | POA: Diagnosis not present

## 2016-03-06 DIAGNOSIS — M6281 Muscle weakness (generalized): Secondary | ICD-10-CM

## 2016-03-06 DIAGNOSIS — R262 Difficulty in walking, not elsewhere classified: Secondary | ICD-10-CM

## 2016-03-06 NOTE — Therapy (Signed)
[facility] [address] [place], [name], [address] Phone: [phone]   Fax:  [phone]  Physical Therapy Treatment  Patient Details  Name: Stephanie Combs MRN: [id number] Date of Birth: Apr 05, 1951 Referring Provider: Barton Fanny  Encounter Date: 03/06/2016      PT End of Session - 03/06/16 1003    Visit Number 2   Number of Visits 12   Date for PT Re-Evaluation 04/04/16   Authorization Type [employer]HC medicare   Authorization - Visit Number 2   Authorization - Number of Visits 12   PT Start Time 0900   PT Stop Time 0950   PT Time Calculation (min) 50 min   Activity Tolerance Patient tolerated treatment well   Behavior During Therapy [facility] for tasks assessed/performed      Past Medical History:  Diagnosis Date  . Anxiety   . CHF (congestive heart failure) (HCC)    Diastolic  . Chronic back pain   . COPD (chronic obstructive pulmonary disease) ([place])   . Degenerative disk disease   . Diabetes mellitus without complication ([place])   . Hypertension   . Hypothyroidism   . On home O2    2.5 L N/C prn  . Pedal edema     Past Surgical History:  Procedure Laterality Date  . HERNIA REPAIR    . TUBAL LIGATION      There were no vitals filed for this visit.      Subjective Assessment - 03/06/16 1001    Subjective Pt states the wraps felt tight but able to keep them on.  States she is going to the dance [place].    Pertinent History PT has been hospitalized for cellulitis; COPD and weakness; Hx of HTN, DM, Asthma, COPD    Currently in Pain? No/denies               LYMPHEDEMA/ONCOLOGY QUESTIONNAIRE - 03/05/16 1015      Date Lymphedema/Swelling Started   Date (P)  02/04/16  for this episode      What other symptoms do you have   Are you Having Heaviness or Tightness (P)  Yes   Are you having Pain (P)  Yes   Are you having pitting edema (P)  Yes   Is it Hard or Difficult finding clothes that fit (P)  Yes   Do you  have infections (P)  No   Stemmer Sign (P)  Yes     Lymphedema Stage   Stage (P)  STAGE 2 SPONTANEOUSLY IRREVERSIBLE     Lymphedema Assessments   Lymphedema Assessments (P)  Lower extremities     Right Lower Extremity Lymphedema   At Midpatella/Popliteal Crease (P)  48.8 cm   30 cm Proximal to Floor at Lateral Plantar Foot (P)  57.1 cm   20 cm Proximal to Floor at Lateral Plantar Foot (P)  51.'3 1   10 '$ cm Proximal to Floor at Lateral Malleoli (P)  35.3 cm   Circumference of ankle/heel (P)  38.2 cm.   5 cm Proximal to 1st MTP Joint (P)  28.5 cm   Across MTP Joint (P)  29 cm   Around Proximal Great Toe (P)  12.3 cm     Left Lower Extremity Lymphedema   30 cm Proximal to Floor at Lateral Plantar Foot (P)  53.8 cm   20 cm Proximal to Floor at Lateral Plantar Foot (P)  48.2 cm   10 cm Proximal to Floor at Lateral Malleoli (P)  32.2 cm   Circumference of ankle/heel (P)  36.7 cm.   5 cm Proximal to 1st MTP Joint (P)  26.9 cm   Across MTP Joint (P)  27.6 cm   Around Proximal Great Toe (P)  11.3 cm                  [facility] Adult PT Treatment/Exercise - 03/06/16 0001      Manual Therapy   Manual Therapy Compression Bandaging   Manual therapy comments unable to complete manual this session due to time eloted for treatment   Compression Bandaging profore bilaterally, no toe wraps today.                PT Education - 03/05/16 1222    Education provided Yes   Education Details The importance of washing feet and LE everyday to prevent cellulitis; the importance of using her compression hose everyday.    Person(s) Educated Patient   Comprehension Verbalized understanding;Returned demonstration          PT Short Term Goals - 03/05/16 1305      PT SHORT TERM GOAL #1   Title Pt pain in her feet  to decrease to no greater than a 5/10 to allow pt to ambulate with rolling walker and O2 with rolling walker.    Time 2   Period Weeks   Status New     PT SHORT TERM GOAL  #2   Title Pt to have no redness or signs of infection in her feet or legs    Time 1   Period Weeks   Status New     PT SHORT TERM GOAL #3   Title Pt to have acquired a long sponge to be able to cleanse lower legs and feet for decreased risk of infection    Time 2   Period Weeks           PT Long Term Goals - 03/05/16 1309      PT LONG TERM GOAL #1   Title Pt fluid in bilateral LE to have decreased by 3 cm to allow pt to be able to don her compression stockings    Time 4   Period Weeks   Status New     PT LONG TERM GOAL #2   Title Pt pain in bilateral LE to be no greater than a 3/10 to allow pt to ambulate with her walker and O2 for 367f without stopping  to improved commuinty ambulation    Time 4   Period Weeks   Status New     PT LONG TERM GOAL #3   Title Pt to have acquired a compression pump and be using twice a day as instructed for maintanence phase of lymphedema.    Time 4   Period Weeks               Plan - 03/06/16 1003    Clinical Impression Statement Completed rebandaging of bilateral LE's including cleansing (especially between toes and around feet) and moisturizing.  All blisters healed on toes and no new open areas noted.  Toe bandaging was irritating on her toes with several bands of compression noted and redness.  Did not utilize these this session.  Unable to complete manual due to only 45 minutes eloted for treatment.  Looking back at notes appears we only used profore and never utilized short stretch bandaging.  will continue with this until pt is able to donn compression socks comfortably.  Rehab Potential Good   PT Frequency 3x / week   PT Duration 4 weeks   PT Treatment/Interventions ADLs/Self Care Home Management;Patient/family education;Manual techniques;Manual lymph drainage;Compression bandaging   PT Next Visit Plan Complete manal techniques both anteriorly and posteriorly; attempt to see why pt never recieved her compression pump,  emphasis the need to wear compression garment everyday.    Consulted and Agree with Plan of Care Patient      Patient will benefit from skilled therapeutic intervention in order to improve the following deficits and impairments:  Pain, Increased edema, Difficulty walking, Obesity, Decreased activity tolerance  Visit Diagnosis: Lymphedema, not elsewhere classified  Muscle weakness (generalized)  Difficulty in walking, not elsewhere classified       G-Codes - 09-Mar-2016 1313    Functional Assessment Tool Used lymph impact questionaire    Functional Limitation Other PT primary   Other PT Primary Current Status (T9122) At least 60 percent but less than 80 percent impaired, limited or restricted   Other PT Primary Goal Status (Z8346) At least 40 percent but less than 60 percent impaired, limited or restricted      Problem List Patient Active Problem List   Diagnosis Date Noted  . Left hand weakness 12/06/2015  . CAP (community acquired pneumonia) 07/25/2015  . Pressure ulcer 05/02/2015  . Hyponatremia 05/01/2015  . Acute-on-chronic kidney injury ([place]) 05/01/2015  . Diabetes mellitus with hyperglycemia, with long-term current use of insulin ([place]) 05/01/2015  . COPD exacerbation ([place]) 04/30/2015  . Cellulitis of foot, right 03/24/2015  . Elevated d-dimer 03/24/2015  . Peripheral edema 12/04/2014  . Bilateral edema of lower extremity   . Diabetic ulcer of right great toe ([place])   . Foot ulcer due to secondary DM ([place]) 11/07/2014  . Diabetic ulcer of right foot ([place]) 11/07/2014  . Edema of lower extremity 05/27/2013  . Headache 05/26/2013  . CKD (chronic kidney disease), stage III 04/14/2013  . Leukocytosis 04/14/2013  . Anemia in CKD (chronic kidney disease) 04/14/2013  . Dyspnea 04/14/2013  . Chronic diastolic CHF (congestive heart failure) ([place])   . COPD (chronic obstructive pulmonary disease) ([place]) 08/07/2012  . Morbid obesity ([place]) 08/07/2012  . DM type 2 (diabetes  mellitus, type 2) ([place]) 08/07/2012  . HTN (hypertension), benign 08/07/2012  . Hypothyroidism (acquired) 08/07/2012    Teena Irani, PTA/CLT [phone]  03/06/2016, 10:08 AM  [facility] [address] [place], [name], [address] Phone: [phone]   Fax:  [phone]  Name: Stephanie Combs MRN: [id number] Date of Birth: 21-Apr-1951

## 2016-03-10 ENCOUNTER — Ambulatory Visit (HOSPITAL_COMMUNITY): Payer: Medicare Other | Admitting: Physical Therapy

## 2016-03-10 DIAGNOSIS — R262 Difficulty in walking, not elsewhere classified: Secondary | ICD-10-CM | POA: Diagnosis not present

## 2016-03-10 DIAGNOSIS — I890 Lymphedema, not elsewhere classified: Secondary | ICD-10-CM

## 2016-03-10 DIAGNOSIS — M6281 Muscle weakness (generalized): Secondary | ICD-10-CM

## 2016-03-10 DIAGNOSIS — M79605 Pain in left leg: Secondary | ICD-10-CM | POA: Diagnosis not present

## 2016-03-10 DIAGNOSIS — M79661 Pain in right lower leg: Secondary | ICD-10-CM | POA: Diagnosis not present

## 2016-03-10 NOTE — Therapy (Signed)
[facility] [address] [place], [name], [address] Phone: [phone]   Fax:  [phone]  Physical Therapy Treatment  Patient Details  Name: Stephanie Combs MRN: [id number] Date of Birth: Jul 10, 1950 Referring Provider: Barton Fanny  Encounter Date: 03/10/2016      PT End of Session - 03/10/16 1216    Visit Number 3   Number of Visits 12   Date for PT Re-Evaluation 04/04/16   Authorization Type UHC medicare   Authorization - Visit Number 3   Authorization - Number of Visits 12   PT Start Time 1102   PT Stop Time 1202   PT Time Calculation (min) 60 min   Activity Tolerance Patient tolerated treatment well   Behavior During Therapy [facility] for tasks assessed/performed      Past Medical History:  Diagnosis Date  . Anxiety   . CHF (congestive heart failure) (HCC)    Diastolic  . Chronic back pain   . COPD (chronic obstructive pulmonary disease) ([place])   . Degenerative disk disease   . Diabetes mellitus without complication ([place])   . Hypertension   . Hypothyroidism   . On home O2    2.5 L N/C prn  . Pedal edema     Past Surgical History:  Procedure Laterality Date  . HERNIA REPAIR    . TUBAL LIGATION      There were no vitals filed for this visit.      Subjective Assessment - 03/10/16 1215    Subjective Pt states that she can tell that she has lost fluid in her legs.    Pertinent History PT has been hospitalized for cellulitis; COPD and weakness; Hx of HTN, DM, Asthma, COPD    Currently in Pain? Yes   Pain Score 4    Pain Location Leg   Pain Orientation Right;Left   Pain Descriptors / Indicators Aching;Throbbing   Aggravating Factors  dependent postition    Pain Relieving Factors elevating LE                         [facility] Adult PT Treatment/Exercise - 03/10/16 0001      Manual Therapy   Manual Therapy Edema management;Manual Lymphatic Drainage (MLD);Compression Bandaging   Manual therapy  comments done seperate from all other aspects of treatment    Manual Lymphatic Drainage (MLD) to include:  supraclavicular, deep and superficial abdominal,routing fluid using inguinal/axillary anastomosis as well as anterior aspect of LE.  Posterior aspect was not completed due to time restraints.    Compression Bandaging toe wrapping bilaterally followed by profore application                   PT Short Term Goals - 03/05/16 1305      PT SHORT TERM GOAL #1   Title Pt pain in her feet  to decrease to no greater than a 5/10 to allow pt to ambulate with rolling walker and O2 with rolling walker.    Time 2   Period Weeks   Status New     PT SHORT TERM GOAL #2   Title Pt to have no redness or signs of infection in her feet or legs    Time 1   Period Weeks   Status New     PT SHORT TERM GOAL #3   Title Pt to have acquired a long sponge to be able to cleanse lower legs and feet for decreased risk  of infection    Time 2   Period Weeks           PT Long Term Goals - 03/05/16 1309      PT LONG TERM GOAL #1   Title Pt fluid in bilateral LE to have decreased by 3 cm to allow pt to be able to don her compression stockings    Time 4   Period Weeks   Status New     PT LONG TERM GOAL #2   Title Pt pain in bilateral LE to be no greater than a 3/10 to allow pt to ambulate with her walker and O2 for 355f without stopping  to improved commuinty ambulation    Time 4   Period Weeks   Status New     PT LONG TERM GOAL #3   Title Pt to have acquired a compression pump and be using twice a day as instructed for maintanence phase of lymphedema.    Time 4   Period Weeks               Plan - 03/10/16 1217    Clinical Impression Statement Pt has open blister on Rt second toe.  All toes have increased swelling.  Xeroform placed on second toe with toe wrapping resumed to B feet.  Pt has brought compression garment but we will wait until wound on toe are healed before attempting  to DON.  Pt request we keep garments here; her name was placed on the bag and we placed them below counter in lymphedema room.     Rehab Potential Good   PT Frequency 3x / week   PT Duration 4 weeks   PT Treatment/Interventions ADLs/Self Care Home Management;Patient/family education;Manual techniques;Manual lymph drainage;Compression bandaging   PT Next Visit Plan Complete manal techniques both anteriorly and posteriorly; attempt to see why pt never recieved her compression pump, emphasis the need to wear compression garment everyday.    Consulted and Agree with Plan of Care Patient      Patient will benefit from skilled therapeutic intervention in order to improve the following deficits and impairments:  Pain, Increased edema, Difficulty walking, Obesity, Decreased activity tolerance  Visit Diagnosis: Lymphedema, not elsewhere classified  Muscle weakness (generalized)  Difficulty in walking, not elsewhere classified  Pain in right lower leg  Pain in left leg     Problem List Patient Active Problem List   Diagnosis Date Noted  . Left hand weakness 12/06/2015  . CAP (community acquired pneumonia) 07/25/2015  . Pressure ulcer 05/02/2015  . Hyponatremia 05/01/2015  . Acute-on-chronic kidney injury ([place] 05/01/2015  . Diabetes mellitus with hyperglycemia, with long-term current use of insulin ([place] 05/01/2015  . COPD exacerbation (HCaddo Mills 04/30/2015  . Cellulitis of foot, right 03/24/2015  . Elevated d-dimer 03/24/2015  . Peripheral edema 12/04/2014  . Bilateral edema of lower extremity   . Diabetic ulcer of right great toe ([place]   . Foot ulcer due to secondary DM (H[place] 11/07/2014  . Diabetic ulcer of right foot (HC[place] 11/07/2014  . Edema of lower extremity 05/27/2013  . Headache 05/26/2013  . CKD (chronic kidney disease), stage III 04/14/2013  . Leukocytosis 04/14/2013  . Anemia in CKD (chronic kidney disease) 04/14/2013  . Dyspnea 04/14/2013  . Chronic diastolic CHF  (congestive heart failure) ([place]osewood [place]   . COPD (chronic obstructive pulmonary disease) ([place] 08/07/2012  . Morbid obesity (HBlo[place] 08/07/2012  . DM type 2 (diabetes mellitus, type 2) (HE[place] 08/07/2012  . HTN (hypertension), benign  08/07/2012  . Hypothyroidism (acquired) 08/07/2012    Rayetta Humphrey, PT CLT [phone] 03/10/2016, 12:20 PM  [place], [name], [address] Phone: [phone]   Fax:  [phone]  Name: Stephanie Combs MRN: [id number] Date of Birth: 10-05-1950

## 2016-03-11 ENCOUNTER — Ambulatory Visit (HOSPITAL_COMMUNITY): Payer: Medicare Other | Admitting: Physical Therapy

## 2016-03-11 DIAGNOSIS — I890 Lymphedema, not elsewhere classified: Secondary | ICD-10-CM

## 2016-03-11 DIAGNOSIS — R262 Difficulty in walking, not elsewhere classified: Secondary | ICD-10-CM

## 2016-03-11 DIAGNOSIS — M79661 Pain in right lower leg: Secondary | ICD-10-CM | POA: Diagnosis not present

## 2016-03-11 DIAGNOSIS — M79605 Pain in left leg: Secondary | ICD-10-CM

## 2016-03-11 DIAGNOSIS — M6281 Muscle weakness (generalized): Secondary | ICD-10-CM | POA: Diagnosis not present

## 2016-03-11 NOTE — Therapy (Signed)
[facility] [place], [name], [address] Phone: [phone]   Fax:  [phone]  Physical Therapy Treatment  Patient Details  Name: Stephanie Combs MRN: [id number] Date of Birth: Feb 20, 1951 Referring Provider: Barton Fanny  Encounter Date: 03/11/2016      PT End of Session - 03/11/16 1554    Visit Number 4   Number of Visits 12   Date for PT Re-Evaluation 04/04/16   Authorization Type UHC medicare   Authorization - Visit Number 4   Authorization - Number of Visits 12   PT Start Time 0900   PT Stop Time 1017   PT Time Calculation (min) 77 min   Activity Tolerance Patient tolerated treatment well   Behavior During Therapy [facility] for tasks assessed/performed      Past Medical History:  Diagnosis Date  . Anxiety   . CHF (congestive heart failure) (HCC)    Diastolic  . Chronic back pain   . COPD (chronic obstructive pulmonary disease) ([place])   . Degenerative disk disease   . Diabetes mellitus without complication ([place])   . Hypertension   . Hypothyroidism   . On home O2    2.5 L N/C prn  . Pedal edema     Past Surgical History:  Procedure Laterality Date  . HERNIA REPAIR    . TUBAL LIGATION      There were no vitals filed for this visit.      Subjective Assessment - 03/11/16 1552    Subjective Pt states her legs feel tight and her toes are tender.  No pain, just can feel it   Currently in Pain? No/denies                         [facility] Adult PT Treatment/Exercise - 03/11/16 1553      Manual Therapy   Manual Therapy Edema management;Manual Lymphatic Drainage (MLD);Compression Bandaging   Manual therapy comments done seperate from all other aspects of treatment    Manual Lymphatic Drainage (MLD) to include:  supraclavicular, deep and superficial abdominal,routing fluid using inguinal/axillary anastomosis as well as anterior aspect of LE.     Compression Bandaging toe wrapping bilaterally  followed by profore application                   PT Short Term Goals - 03/05/16 1305      PT SHORT TERM GOAL #1   Title Pt pain in her feet  to decrease to no greater than a 5/10 to allow pt to ambulate with rolling walker and O2 with rolling walker.    Time 2   Period Weeks   Status New     PT SHORT TERM GOAL #2   Title Pt to have no redness or signs of infection in her feet or legs    Time 1   Period Weeks   Status New     PT SHORT TERM GOAL #3   Title Pt to have acquired a long sponge to be able to cleanse lower legs and feet for decreased risk of infection    Time 2   Period Weeks           PT Long Term Goals - 03/05/16 1309      PT LONG TERM GOAL #1   Title Pt fluid in bilateral LE to have decreased by 3 cm to allow pt to be able to don her compression stockings  Time 4   Period Weeks   Status New     PT LONG TERM GOAL #2   Title Pt pain in bilateral LE to be no greater than a 3/10 to allow pt to ambulate with her walker and O2 for 376f without stopping  to improved commuinty ambulation    Time 4   Period Weeks   Status New     PT LONG TERM GOAL #3   Title Pt to have acquired a compression pump and be using twice a day as instructed for maintanence phase of lymphedema.    Time 4   Period Weeks               Plan - 03/11/16 1554    Clinical Impression Statement Area on Rt second toe is now 100% granulated and approximating well.  Cleansed and moisturized LE's well following manual and prior to rebandaging.  No other open areas noted.  Anticipate blistered toe to be healed next session with hopes to soon transition back in to garments.   [facility]    Rehab Potential Good   PT Frequency 3x / week   PT Duration 4 weeks   PT Treatment/Interventions ADLs/Self Care Home Management;Patient/family education;Manual techniques;Manual lymph drainage;Compression bandaging   PT Next  Visit Plan Complete manal techniques and compression bandaging using profores.  Follow up with compression pump (connie Cares).  Remeasure next session.   Consulted and Agree with Plan of Care Patient      Patient will benefit from skilled therapeutic intervention in order to improve the following deficits and impairments:  Pain, Increased edema, Difficulty walking, Obesity, Decreased activity tolerance  Visit Diagnosis: Lymphedema, not elsewhere classified  Muscle weakness (generalized)  Difficulty in walking, not elsewhere classified  Pain in right lower leg  Pain in left leg     Problem List Patient Active Problem List   Diagnosis Date Noted  . Left hand weakness 12/06/2015  . CAP (community acquired pneumonia) 07/25/2015  . Pressure ulcer 05/02/2015  . Hyponatremia 05/01/2015  . Acute-on-chronic kidney injury (HJackson 05/01/2015  . Diabetes mellitus with hyperglycemia, with long-term current use of insulin (HChanh[place] 05/01/2015  . COPD exacerbation (HLoma Vista 04/30/2015  . Cellulitis of foot, right 03/24/2015  . Elevated d-dimer 03/24/2015  . Peripheral edema 12/04/2014  . Bilateral edema of lower extremity   . Diabetic ulcer of right great toe (HKalamaz[place]   . Foot ulcer due to secondary DM (HWarren City 11/07/2014  . Diabetic ulcer of right foot (HPeak [place] 11/07/2014  . Edema of lower extremity 05/27/2013  . Headache 05/26/2013  . CKD (chronic kidney disease), stage III 04/14/2013  . Leukocytosis 04/14/2013  . Anemia in CKD (chronic kidney disease) 04/14/2013  . Dyspnea 04/14/2013  . Chronic diastolic CHF (congestive heart failure) (HT[place] Creek   . COPD (chronic obstructive pulmonary disease) (H[place] 08/07/2012  . Morbid obesity (HS[place] 08/07/2012  . DM type 2 (diabetes mellitus, type 2) (HCaspar 08/07/2012  . HTN (hypertension), benign 08/07/2012  . Hypothyroidism (acquired) 08/07/2012    ATeena Irani PTA/CLT [phone] 03/11/2016, 3:58 PM  [place] [address]Phone: [phone]  Fax:  [phone] Name: Stephanie SIMONETMRN: [id number] of Birth: 805-May-1952

## 2016-03-13 ENCOUNTER — Ambulatory Visit (HOSPITAL_COMMUNITY): Payer: Medicare Other | Attending: Neurology | Admitting: Physical Therapy

## 2016-03-13 DIAGNOSIS — M6281 Muscle weakness (generalized): Secondary | ICD-10-CM | POA: Insufficient documentation

## 2016-03-13 DIAGNOSIS — R262 Difficulty in walking, not elsewhere classified: Secondary | ICD-10-CM | POA: Insufficient documentation

## 2016-03-13 DIAGNOSIS — R2689 Other abnormalities of gait and mobility: Secondary | ICD-10-CM | POA: Diagnosis present

## 2016-03-13 DIAGNOSIS — M79605 Pain in left leg: Secondary | ICD-10-CM | POA: Diagnosis present

## 2016-03-13 DIAGNOSIS — M79661 Pain in right lower leg: Secondary | ICD-10-CM | POA: Insufficient documentation

## 2016-03-13 DIAGNOSIS — I890 Lymphedema, not elsewhere classified: Secondary | ICD-10-CM | POA: Diagnosis not present

## 2016-03-13 NOTE — Therapy (Signed)
[place], [name], [address] Phone: [phone]   Fax:  [phone]  Physical Therapy Treatment  Patient Details  Name: Stephanie Combs MRN: [id number] Date of Birth: 1950/07/18 Referring Provider: Barton Fanny  Encounter Date: 03/13/2016      PT End of Session - 03/13/16 1346    Visit Number 5   Number of Visits 12   Date for PT Re-Evaluation 04/04/16   Authorization Type UHC medicare   Authorization - Visit Number 5   Authorization - Number of Visits 12   PT Start Time 0900   PT Stop Time 0945   PT Time Calculation (min) 45 min   Activity Tolerance Patient tolerated treatment well   Behavior During Therapy [facility] for tasks assessed/performed      Past Medical History:  Diagnosis Date  . Anxiety   . CHF (congestive heart failure) (HCC)    Diastolic  . Chronic back pain   . COPD (chronic obstructive pulmonary disease) ([place])   . Degenerative disk disease   . Diabetes mellitus without complication ([place])   . Hypertension   . Hypothyroidism   . On home O2    2.5 L N/C prn  . Pedal edema     Past Surgical History:  Procedure Laterality Date  . HERNIA REPAIR    . TUBAL LIGATION      There were no vitals filed for this visit.      Subjective Assessment - 03/13/16 1344    Subjective Pt came earlier as she got her appoitnments confused.     Currently in Pain? No/denies                         [facility] Adult PT Treatment/Exercise - 03/13/16 1345      Manual Therapy   Manual Therapy Compression Bandaging   Manual therapy comments done seperate from all other aspects of treatment    Manual Lymphatic Drainage (MLD) unable to complete this session; to include:  supraclavicular, deep and superficial abdominal,routing fluid using inguinal/axillary anastomosis as well as anterior aspect of LE.     Compression Bandaging toe wrapping bilaterally followed by profore application                    PT Short Term Goals - 03/05/16 1305      PT SHORT TERM GOAL #1   Title Pt pain in her feet  to decrease to no greater than a 5/10 to allow pt to ambulate with rolling walker and O2 with rolling walker.    Time 2   Period Weeks   Status New     PT SHORT TERM GOAL #2   Title Pt to have no redness or signs of infection in her feet or legs    Time 1   Period Weeks   Status New     PT SHORT TERM GOAL #3   Title Pt to have acquired a long sponge to be able to cleanse lower legs and feet for decreased risk of infection    Time 2   Period Weeks           PT Long Term Goals - 03/05/16 1309      PT LONG TERM GOAL #1   Title Pt fluid in bilateral LE to have decreased by 3 cm to allow pt to be able to don her compression stockings    Time 4  Period Weeks   Status New     PT LONG TERM GOAL #2   Title Pt pain in bilateral LE to be no greater than a 3/10 to allow pt to ambulate with her walker and O2 for 360f without stopping  to improved commuinty ambulation    Time 4   Period Weeks   Status New     PT LONG TERM GOAL #3   Title Pt to have acquired a compression pump and be using twice a day as instructed for maintanence phase of lymphedema.    Time 4   Period Weeks               Plan - 03/13/16 1346    Clinical Impression Statement Pt unable to get full 90 minute treatment today, so unable to complete manual lymph drainage.  Removed profores and revealed all toe bandages had come off of Rt foot and toes engorged with fluid.  Lt foot/toes decompressed well with bandages still intact.  Removed dressings, cleansed, moisturized and reapplied profore including toe bandages.     Rehab Potential Good   PT Frequency 3x / week   PT Duration 4 weeks   PT Treatment/Interventions ADLs/Self Care Home Management;Patient/family education;Manual techniques;Manual lymph drainage;Compression bandaging   PT Next Visit Plan Complete manal techniques and  compression bandaging using profores.  Follow up with compression pump (connie Cares).  Remeasure next session.   Consulted and Agree with Plan of Care Patient      Patient will benefit from skilled therapeutic intervention in order to improve the following deficits and impairments:  Pain, Increased edema, Difficulty walking, Obesity, Decreased activity tolerance  Visit Diagnosis: Lymphedema, not elsewhere classified  Muscle weakness (generalized)  Difficulty in walking, not elsewhere classified  Pain in right lower leg  Pain in left leg     Problem List Patient Active Problem List   Diagnosis Date Noted  . Left hand weakness 12/06/2015  . CAP (community acquired pneumonia) 07/25/2015  . Pressure ulcer 05/02/2015  . Hyponatremia 05/01/2015  . Acute-on-chronic kidney injury (HSeboyeta 05/01/2015  . Diabetes mellitus with hyperglycemia, with long-term current use of insulin (HLorena 05/01/2015  . COPD exacerbation (HNyssa 04/30/2015  . Cellulitis of foot, right 03/24/2015  . Elevated d-dimer 03/24/2015  . Peripheral edema 12/04/2014  . Bilateral edema of lower extremity   . Diabetic ulcer of right great toe (HPortage   . Foot ulcer due to secondary DM (HTru[place] 11/07/2014  . Diabetic ulcer of right foot (HHe[place] 11/07/2014  . Edema of lower extremity 05/27/2013  . Headache 05/26/2013  . CKD (chronic kidney disease), stage III 04/14/2013  . Leukocytosis 04/14/2013  . Anemia in CKD (chronic kidney disease) 04/14/2013  . Dyspnea 04/14/2013  . Chronic diastolic CHF (congestive heart failure) (HO[place]   . COPD (chronic obstructive pulmonary disease) (HLyman 08/07/2012  . Morbid obesity (HGlencoe 08/07/2012  . DM type 2 (diabetes mellitus, type 2) (HDeer Island 08/07/2012  . HTN (hypertension), benign 08/07/2012  . Hypothyroidism (acquired) 08/07/2012    ATeena Irani PTA/CLT [phone] 03/13/2016, 1:48 PM  [id number]  [address]Phone: [phone]  Fax:  [phone] Name: Stephanie MILERMRN: [id number] of Birth: 814-Nov-1952

## 2016-03-17 ENCOUNTER — Ambulatory Visit (HOSPITAL_COMMUNITY): Payer: Medicare Other | Admitting: Physical Therapy

## 2016-03-17 DIAGNOSIS — R2689 Other abnormalities of gait and mobility: Secondary | ICD-10-CM | POA: Diagnosis not present

## 2016-03-17 DIAGNOSIS — M79605 Pain in left leg: Secondary | ICD-10-CM | POA: Diagnosis not present

## 2016-03-17 DIAGNOSIS — M79661 Pain in right lower leg: Secondary | ICD-10-CM | POA: Diagnosis not present

## 2016-03-17 DIAGNOSIS — R262 Difficulty in walking, not elsewhere classified: Secondary | ICD-10-CM | POA: Diagnosis not present

## 2016-03-17 DIAGNOSIS — I10 Essential (primary) hypertension: Secondary | ICD-10-CM | POA: Diagnosis not present

## 2016-03-17 DIAGNOSIS — Z23 Encounter for immunization: Secondary | ICD-10-CM | POA: Diagnosis not present

## 2016-03-17 DIAGNOSIS — M6281 Muscle weakness (generalized): Secondary | ICD-10-CM

## 2016-03-17 DIAGNOSIS — E1143 Type 2 diabetes mellitus with diabetic autonomic (poly)neuropathy: Secondary | ICD-10-CM | POA: Diagnosis not present

## 2016-03-17 DIAGNOSIS — E039 Hypothyroidism, unspecified: Secondary | ICD-10-CM | POA: Diagnosis not present

## 2016-03-17 DIAGNOSIS — I509 Heart failure, unspecified: Secondary | ICD-10-CM | POA: Diagnosis not present

## 2016-03-17 DIAGNOSIS — I890 Lymphedema, not elsewhere classified: Secondary | ICD-10-CM | POA: Diagnosis not present

## 2016-03-17 NOTE — Therapy (Signed)
[facility] [address] [place], [name], [address] Phone: [phone]   Fax:  [phone]  Physical Therapy Treatment  Patient Details  Name: Stephanie Combs MRN: [id number] Date of Birth: 07/28/1950 Referring Provider: Barton Fanny  Encounter Date: 03/17/2016      PT End of Session - 03/17/16 1149    Visit Number 6   Number of Visits 12   Date for PT Re-Evaluation 04/04/16   Authorization Type UHC medicare   Authorization - Visit Number 6   Authorization - Number of Visits 12   PT Start Time 4562   PT Stop Time 1125   PT Time Calculation (min) 50 min   Activity Tolerance Patient tolerated treatment well   Behavior During Therapy [facility] for tasks assessed/performed      Past Medical History:  Diagnosis Date  . Anxiety   . CHF (congestive heart failure) (HCC)    Diastolic  . Chronic back pain   . COPD (chronic obstructive pulmonary disease) ([place])   . Degenerative disk disease   . Diabetes mellitus without complication ([place])   . Hypertension   . Hypothyroidism   . On home O2    2.5 L N/C prn  . Pedal edema     Past Surgical History:  Procedure Laterality Date  . HERNIA REPAIR    . TUBAL LIGATION      There were no vitals filed for this visit.      Subjective Assessment - 03/17/16 1143    Subjective Pt comes today with wet socks and bottom of bandages well.  Also with toe bandages curled up on Rt foot.  No pain   Currently in Pain? No/denies               LYMPHEDEMA/ONCOLOGY QUESTIONNAIRE - 03/17/16 1044      Right Lower Extremity Lymphedema   At Midpatella/Popliteal Crease 48 cm  was 48.8   30 cm Proximal to Floor at Lateral Plantar Foot 48 cm  was 57.1   20 cm Proximal to Floor at Lateral Plantar Foot 41 1  was 51.3   10 cm Proximal to Floor at Lateral Malleoli 28.4 cm  was 35.3   Circumference of ankle/heel 33.6 cm.  was 38.2   5 cm Proximal to 1st MTP Joint 26 cm  was 28.5   Across MTP  Joint 27.5 cm  was 29   Around Proximal Great Toe 11 cm  was 12.3     Left Lower Extremity Lymphedema   At Midpatella/Popliteal Crease 43.2 cm   30 cm Proximal to Floor at Lateral Plantar Foot 45 cm  was 53.8   20 cm Proximal to Floor at Lateral Plantar Foot 37.5 cm  was 48.2   10 cm Proximal to Floor at Lateral Malleoli 28.2 cm  was 32.2   Circumference of ankle/heel 32.6 cm.  was 36.7   5 cm Proximal to 1st MTP Joint 24.2 cm  was 26.9   Across MTP Joint 26.4 cm  was 27.6   Around Proximal Great Toe 9.7 cm  was 11.3                  [facility] Adult PT Treatment/Exercise - 03/17/16 1143      Manual Therapy   Manual Therapy Compression Bandaging   Manual therapy comments done seperate from all other aspects of treatment    Manual Lymphatic Drainage (MLD) unable to complete this session; to include:  supraclavicular, deep and superficial  abdominal,routing fluid using inguinal/axillary anastomosis as well as anterior aspect of LE.     Compression Bandaging toe wrapping bilaterally followed by profore application                   PT Short Term Goals - 03/05/16 1305      PT SHORT TERM GOAL #1   Title Pt pain in her feet  to decrease to no greater than a 5/10 to allow pt to ambulate with rolling walker and O2 with rolling walker.    Time 2   Period Weeks   Status New     PT SHORT TERM GOAL #2   Title Pt to have no redness or signs of infection in her feet or legs    Time 1   Period Weeks   Status New     PT SHORT TERM GOAL #3   Title Pt to have acquired a long sponge to be able to cleanse lower legs and feet for decreased risk of infection    Time 2   Period Weeks           PT Long Term Goals - 03/05/16 1309      PT LONG TERM GOAL #1   Title Pt fluid in bilateral LE to have decreased by 3 cm to allow pt to be able to don her compression stockings    Time 4   Period Weeks   Status New     PT LONG TERM GOAL #2   Title Pt pain in bilateral LE  to be no greater than a 3/10 to allow pt to ambulate with her walker and O2 for 368f without stopping  to improved commuinty ambulation    Time 4   Period Weeks   Status New     PT LONG TERM GOAL #3   Title Pt to have acquired a compression pump and be using twice a day as instructed for maintanence phase of lymphedema.    Time 4   Period Weeks               Plan - 03/17/16 1149    Clinical Impression Statement Pt scheduled a single slot this session, so unable to complete manual lymph drainage.  Bilateral LE's remeasured today with overall decompression except for Rt dorsal foot and toes.  Toes remain engorged as she is having difficulty keeping the toe bandages secure.  Cleansed LE's, moisturized and redressed wtih profore system including toe bandages. Anticpate return to compression garments by end of this week.    Rehab Potential Good   PT Frequency 3x / week   PT Duration 4 weeks   PT Treatment/Interventions ADLs/Self Care Home Management;Patient/family education;Manual techniques;Manual lymph drainage;Compression bandaging   PT Next Visit Plan Complete manal techniques and compression bandaging using profores.  Follow up with compression pump (connie    Consulted and Agree with Plan of Care Patient      Patient will benefit from skilled therapeutic intervention in order to improve the following deficits and impairments:  Pain, Increased edema, Difficulty walking, Obesity, Decreased activity tolerance  Visit Diagnosis: Lymphedema, not elsewhere classified  Muscle weakness (generalized)  Difficulty in walking, not elsewhere classified     Problem List Patient Active Problem List   Diagnosis Date Noted  . Left hand weakness 12/06/2015  . CAP (community acquired pneumonia) 07/25/2015  . Pressure ulcer 05/02/2015  . Hyponatremia 05/01/2015  . Acute-on-chronic kidney injury ([place] 05/01/2015  . Diabetes mellitus with hyperglycemia, with  long-term current use of  insulin ([place]) 05/01/2015  . COPD exacerbation ([place]) 04/30/2015  . Cellulitis of foot, right 03/24/2015  . Elevated d-dimer 03/24/2015  . Peripheral edema 12/04/2014  . Bilateral edema of lower extremity   . Diabetic ulcer of right great toe ([place])   . Foot ulcer due to secondary DM ([place]) 11/07/2014  . Diabetic ulcer of right foot ([place]) 11/07/2014  . Edema of lower extremity 05/27/2013  . Headache 05/26/2013  . CKD (chronic kidney disease), stage III 04/14/2013  . Leukocytosis 04/14/2013  . Anemia in CKD (chronic kidney disease) 04/14/2013  . Dyspnea 04/14/2013  . Chronic diastolic CHF (congestive heart failure) ([place])   . COPD (chronic obstructive pulmonary disease) ([place]) 08/07/2012  . Morbid obesity ([place]) 08/07/2012  . DM type 2 (diabetes mellitus, type 2) ([place]) 08/07/2012  . HTN (hypertension), benign 08/07/2012  . Hypothyroidism (acquired) 08/07/2012    Teena Irani, PTA/CLT [phone]  03/17/2016, 11:52 AM  [facility] [address] [place], [name], [address] Phone: [phone]   Fax:  [phone]  Name: Stephanie Combs MRN: [id number] Date of Birth: Aug 14, 1950

## 2016-03-18 ENCOUNTER — Other Ambulatory Visit (HOSPITAL_COMMUNITY): Payer: Self-pay | Admitting: Internal Medicine

## 2016-03-18 DIAGNOSIS — Z780 Asymptomatic menopausal state: Secondary | ICD-10-CM

## 2016-03-18 DIAGNOSIS — Z1231 Encounter for screening mammogram for malignant neoplasm of breast: Secondary | ICD-10-CM

## 2016-03-19 ENCOUNTER — Telehealth (HOSPITAL_COMMUNITY): Payer: Self-pay | Admitting: Internal Medicine

## 2016-03-19 ENCOUNTER — Ambulatory Visit (HOSPITAL_COMMUNITY): Payer: Medicare Other | Admitting: Physical Therapy

## 2016-03-19 NOTE — Telephone Encounter (Signed)
03/19/16 cx - said that she got her flu shot yesterday and is sick today and has diarrhea

## 2016-03-21 ENCOUNTER — Ambulatory Visit (HOSPITAL_COMMUNITY): Payer: Medicare Other | Admitting: Physical Therapy

## 2016-03-21 ENCOUNTER — Telehealth (HOSPITAL_COMMUNITY): Payer: Self-pay | Admitting: Internal Medicine
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
## 2016-03-24 ENCOUNTER — Ambulatory Visit (HOSPITAL_COMMUNITY): Payer: Medicare Other | Admitting: Physical Therapy

## 2016-03-24 DIAGNOSIS — I890 Lymphedema, not elsewhere classified: Secondary | ICD-10-CM | POA: Diagnosis not present

## 2016-03-24 DIAGNOSIS — M79605 Pain in left leg: Secondary | ICD-10-CM | POA: Diagnosis not present

## 2016-03-24 DIAGNOSIS — R262 Difficulty in walking, not elsewhere classified: Secondary | ICD-10-CM

## 2016-03-24 DIAGNOSIS — R2689 Other abnormalities of gait and mobility: Secondary | ICD-10-CM | POA: Diagnosis not present

## 2016-03-24 DIAGNOSIS — M79661 Pain in right lower leg: Secondary | ICD-10-CM

## 2016-03-24 DIAGNOSIS — M6281 Muscle weakness (generalized): Secondary | ICD-10-CM | POA: Diagnosis not present
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
## 2016-03-25 ENCOUNTER — Telehealth (HOSPITAL_COMMUNITY): Payer: Self-pay | Admitting: Internal Medicine

## 2016-03-25 ENCOUNTER — Ambulatory Visit (HOSPITAL_COMMUNITY): Payer: Medicare Other | Admitting: Physical Therapy

## 2016-03-25 NOTE — Telephone Encounter (Signed)
03/25/16 pt left a messge that she was sick, has been up all night and cx for today

## 2016-03-27 ENCOUNTER — Ambulatory Visit (HOSPITAL_COMMUNITY): Payer: Medicare Other | Admitting: Physical Therapy

## 2016-03-27 DIAGNOSIS — R262 Difficulty in walking, not elsewhere classified: Secondary | ICD-10-CM | POA: Diagnosis not present

## 2016-03-27 DIAGNOSIS — M79661 Pain in right lower leg: Secondary | ICD-10-CM | POA: Diagnosis not present

## 2016-03-27 DIAGNOSIS — M6281 Muscle weakness (generalized): Secondary | ICD-10-CM | POA: Diagnosis not present

## 2016-03-27 DIAGNOSIS — Z79891 Long term (current) use of opiate analgesic: Secondary | ICD-10-CM | POA: Diagnosis not present

## 2016-03-27 DIAGNOSIS — R2689 Other abnormalities of gait and mobility: Secondary | ICD-10-CM | POA: Diagnosis not present

## 2016-03-27 DIAGNOSIS — I890 Lymphedema, not elsewhere classified: Secondary | ICD-10-CM

## 2016-03-27 DIAGNOSIS — M79605 Pain in left leg: Secondary | ICD-10-CM

## 2016-03-27 DIAGNOSIS — M5416 Radiculopathy, lumbar region: Secondary | ICD-10-CM | POA: Diagnosis not present

## 2016-03-27 NOTE — Therapy (Signed)
[facility] [place], [name], [address] Phone: [phone]   Fax:  [phone]  Physical Therapy Treatment  Patient Details  Name: Stephanie Combs MRN: [id number] Date of Birth: 11/16/1950 Referring Provider: Barton Fanny  Encounter Date: 03/27/2016      PT End of Session - 03/27/16 1108    Visit Number 8   Number of Visits 12   Date for PT Re-Evaluation 04/04/16   Authorization Type UHC medicare   Authorization - Visit Number 8   Authorization - Number of Visits 12   PT Start Time 0912   PT Stop Time 0945   PT Time Calculation (min) 33 min   Activity Tolerance Patient tolerated treatment well   Behavior During Therapy [facility] for tasks assessed/performed      Past Medical History:  Diagnosis Date  . Anxiety   . CHF (congestive heart failure) (HCC)    Diastolic  . Chronic back pain   . COPD (chronic obstructive pulmonary disease) ([place])   . Degenerative disk disease   . Diabetes mellitus without complication ([place])   . Hypertension   . Hypothyroidism   . On home O2    2.5 L N/C prn  . Pedal edema     Past Surgical History:  Procedure Laterality Date  . HERNIA REPAIR    . TUBAL LIGATION      There were no vitals filed for this visit.      Subjective Assessment - 03/27/16 1105    Subjective Pt showed 11 minutes late and only scheduled one appointment slot this session.  Reports no pain and with dressings still intact.   Currently in Pain? No/denies               LYMPHEDEMA/ONCOLOGY QUESTIONNAIRE - 03/27/16 0932      Right Lower Extremity Lymphedema   At Midpatella/Popliteal Crease 48 cm   30 cm Proximal to Floor at Lateral Plantar Foot 47 cm   20 cm Proximal to Floor at Lateral Plantar Foot 40.'5 1   10 '$ cm Proximal to Floor at Lateral Malleoli 28.8 cm   Circumference of ankle/heel 32.8 cm.   5 cm Proximal to 1st MTP Joint 25 cm   Across MTP Joint 26 cm   Around Proximal Great Toe 10 cm      Left Lower Extremity Lymphedema   At Midpatella/Popliteal Crease 43 cm   30 cm Proximal to Floor at Lateral Plantar Foot 44.5 cm   20 cm Proximal to Floor at Lateral Plantar Foot 37 cm   10 cm Proximal to Floor at Lateral Malleoli 27.4 cm   Circumference of ankle/heel 32 cm.   5 cm Proximal to 1st MTP Joint 24.3 cm   Across MTP Joint 26.5 cm   Around Proximal Great Toe 8.8 cm                  [facility] Adult PT Treatment/Exercise - 03/27/16 0001      Manual Therapy   Manual Therapy Compression Bandaging;Other (comment)   Manual therapy comments done seperate from all other aspects of treatment    Compression Bandaging donning of personal compression garments   Other Manual Therapy measurements of bilateral LE's                  PT Short Term Goals - 03/24/16 1221      PT SHORT TERM GOAL #1   Title Pt pain in her feet  to decrease to  no greater than a 5/10 to allow pt to ambulate with rolling walker and O2 with rolling walker.    Time 2   Period Weeks   Status Achieved     PT SHORT TERM GOAL #2   Title Pt to have no redness or signs of infection in her feet or legs    Time 1   Period Weeks   Status On-going     PT SHORT TERM GOAL #3   Title Pt to have acquired a long sponge to be able to cleanse lower legs and feet for decreased risk of infection    Time 2   Period Weeks   Status On-going           PT Long Term Goals - 03/24/16 1221      PT LONG TERM GOAL #1   Title Pt fluid in bilateral LE to have decreased by 3 cm to allow pt to be able to don her compression stockings    Time 4   Period Weeks   Status On-going     PT LONG TERM GOAL #2   Title Pt pain in bilateral LE to be no greater than a 3/10 to allow pt to ambulate with her walker and O2 for 326f without stopping  to improved commuinty ambulation    Time 4   Period Weeks   Status Achieved     PT LONG TERM GOAL #3   Title Pt to have acquired a compression pump and be using twice a  day as instructed for maintanence phase of lymphedema.    Time 4   Period Weeks   Status On-going               Plan - 03/27/16 1109    Clinical Impression Statement Unable to complete full session due to time available. Remeasured with slight reduction in circumference bilaterally.  Blistered area on Rt second toe is now healed with noted reduction of edema and induration in toes.  Able to donn personal knee high garments this session . pt is unable to complete this independently and has someone come do it for her daily.    Rehab Potential Good   PT Frequency 3x / week   PT Duration 4 weeks   PT Treatment/Interventions ADLs/Self Care Home Management;Patient/family education;Manual techniques;Manual lymph drainage;Compression bandaging   PT Next Visit Plan Measure volume next treatment to ensure levels are holding with using of garments.  Re-evaluate next week with possible discharge.   Consulted and Agree with Plan of Care Patient      Patient will benefit from skilled therapeutic intervention in order to improve the following deficits and impairments:  Pain, Increased edema, Difficulty walking, Obesity, Decreased activity tolerance  Visit Diagnosis: Lymphedema, not elsewhere classified  Muscle weakness (generalized)  Difficulty in walking, not elsewhere classified  Pain in right lower leg  Pain in left leg     Problem List Patient Active Problem List   Diagnosis Date Noted  . Left hand weakness 12/06/2015  . CAP (community acquired pneumonia) 07/25/2015  . Pressure ulcer 05/02/2015  . Hyponatremia 05/01/2015  . Acute-on-chronic kidney injury ([place] 05/01/2015  . Diabetes mellitus with hyperglycemia, with long-term current use of insulin ([place] 05/01/2015  . COPD exacerbation ([place] 04/30/2015  . Cellulitis of foot, right 03/24/2015  . Elevated d-dimer 03/24/2015  . Peripheral edema 12/04/2014  . Bilateral edema of lower extremity   . Diabetic ulcer of right great  toe ([facility] Center   . Foot  ulcer due to secondary DM ([place]) 11/07/2014  . Diabetic ulcer of right foot ([place]) 11/07/2014  . Edema of lower extremity 05/27/2013  . Headache 05/26/2013  . CKD (chronic kidney disease), stage III 04/14/2013  . Leukocytosis 04/14/2013  . Anemia in CKD (chronic kidney disease) 04/14/2013  . Dyspnea 04/14/2013  . Chronic diastolic CHF (congestive heart failure) ([place])   . COPD (chronic obstructive pulmonary disease) ([place]) 08/07/2012  . Morbid obesity ([place]) 08/07/2012  . DM type 2 (diabetes mellitus, type 2) ([place]) 08/07/2012  . HTN (hypertension), benign 08/07/2012  . Hypothyroidism (acquired) 08/07/2012    Teena Irani, PTA/CLT [phone]  03/27/2016, 11:12 AM  [facility], [name], [address] Phone: [phone]   Fax:  [phone]  Name: Stephanie Combs MRN: [id number] Date of Birth: 04-15-1951

## 2016-03-28 ENCOUNTER — Ambulatory Visit (HOSPITAL_COMMUNITY): Payer: Medicare Other | Admitting: Physical Therapy

## 2016-03-31 ENCOUNTER — Ambulatory Visit (HOSPITAL_COMMUNITY): Payer: Medicare Other | Admitting: Physical Therapy

## 2016-03-31 DIAGNOSIS — R262 Difficulty in walking, not elsewhere classified: Secondary | ICD-10-CM

## 2016-03-31 DIAGNOSIS — R2689 Other abnormalities of gait and mobility: Secondary | ICD-10-CM | POA: Diagnosis not present

## 2016-03-31 DIAGNOSIS — I890 Lymphedema, not elsewhere classified: Secondary | ICD-10-CM | POA: Diagnosis not present

## 2016-03-31 DIAGNOSIS — M79605 Pain in left leg: Secondary | ICD-10-CM | POA: Diagnosis not present

## 2016-03-31 DIAGNOSIS — M6281 Muscle weakness (generalized): Secondary | ICD-10-CM

## 2016-03-31 DIAGNOSIS — M79661 Pain in right lower leg: Secondary | ICD-10-CM | POA: Diagnosis not present

## 2016-03-31 NOTE — Therapy (Signed)
[place], [name], [address] Phone: [phone]   Fax:  [phone]  Physical Therapy Treatment  Patient Details  Name: Stephanie Combs MRN: [id number] Date of Birth: [birth year] Referring Provider: Barton Fanny  Encounter Date: 03/31/2016      PT End of Session - 03/31/16 1159    Visit Number 9   Number of Visits 12   Date for PT Re-Evaluation 04/04/16   Authorization Type UHC medicare   Authorization - Visit Number 9   Authorization - Number of Visits 12   PT Start Time 1779   PT Stop Time 1155   PT Time Calculation (min) 70 min   Activity Tolerance Patient tolerated treatment well   Behavior During Therapy [facility] for tasks assessed/performed      Past Medical History:  Diagnosis Date  . Anxiety   . CHF (congestive heart failure) (HCC)    Diastolic  . Chronic back pain   . COPD (chronic obstructive pulmonary disease) ([place])   . Degenerative disk disease   . Diabetes mellitus without complication ([place])   . Hypertension   . Hypothyroidism   . On home O2    2.5 L N/C prn  . Pedal edema     Past Surgical History:  Procedure Laterality Date  . HERNIA REPAIR    . TUBAL LIGATION      There were no vitals filed for this visit.      Subjective Assessment - 03/31/16 1157    Subjective Pt 12 minutes late for appointment.  States that her legs swelled with her stockings on.  Therapist questioned if the pt had her stockings on the whole weekend pt verbalized that she didi.      Pertinent History PT has been hospitalized for cellulitis; COPD and weakness; Hx of HTN, DM, Asthma, COPD    Currently in Pain? Yes   Pain Score 4    Pain Location Toe (Comment which one)   Pain Orientation Right   Pain Descriptors / Indicators Burning;Aching   Pain Type Chronic pain   Pain Onset More than a month ago   Pain Frequency Intermittent   Aggravating Factors  swelling   Pain Relieving Factors decongestion                 LYMPHEDEMA/ONCOLOGY QUESTIONNAIRE - 03/31/16 1039      Right Lower Extremity Lymphedema   At Midpatella/Popliteal Crease (P)  47.7 cm   30 cm Proximal to Floor at Lateral Plantar Foot (P)  52 cm   20 cm Proximal to Floor at Lateral Plantar Foot (P)  47.'8 1   10 '$ cm Proximal to Floor at Lateral Malleoli (P)  33.6 cm   Circumference of ankle/heel (P)  35.8 cm.   5 cm Proximal to 1st MTP Joint (P)  27.7 cm   Across MTP Joint (P)  27.2 cm   Around Proximal Great Toe (P)  12 cm     Left Lower Extremity Lymphedema   At Midpatella/Popliteal Crease (P)  43.5 cm   30 cm Proximal to Floor at Lateral Plantar Foot (P)  50.5 cm   20 cm Proximal to Floor at Lateral Plantar Foot (P)  46.7 cm   10 cm Proximal to Floor at Lateral Malleoli (P)  33 cm   Circumference of ankle/heel (P)  34.2 cm.   5 cm Proximal to 1st MTP Joint (P)  28.2 cm   Across MTP Joint (P)  27 cm  Around Proximal Great Toe (P)  10.3 cm                  [facility] Adult PT Treatment/Exercise - 03/31/16 0001      Manual Therapy   Manual Therapy Compression Bandaging   Manual therapy comments done seperate from all other aspects of treatment    Manual Lymphatic Drainage (MLD)  to include:  supraclavicular, deep and superficial abdominal,routing fluid using inguinal/axillary anastomosis as well as anterior and posterior aspect of LE.     Compression Bandaging toe wrapping bilaterally followed by profore application    Other Manual Therapy measurements of bilateral LE's                  PT Short Term Goals - 03/24/16 1221      PT SHORT TERM GOAL #1   Title Pt pain in her feet  to decrease to no greater than a 5/10 to allow pt to ambulate with rolling walker and O2 with rolling walker.    Time 2   Period Weeks   Status Achieved     PT SHORT TERM GOAL #2   Title Pt to have no redness or signs of infection in her feet or legs    Time 1   Period Weeks   Status On-going     PT SHORT TERM  GOAL #3   Title Pt to have acquired a long sponge to be able to cleanse lower legs and feet for decreased risk of infection    Time 2   Period Weeks   Status On-going           PT Long Term Goals - 03/24/16 1221      PT LONG TERM GOAL #1   Title Pt fluid in bilateral LE to have decreased by 3 cm to allow pt to be able to don her compression stockings    Time 4   Period Weeks   Status On-going     PT LONG TERM GOAL #2   Title Pt pain in bilateral LE to be no greater than a 3/10 to allow pt to ambulate with her walker and O2 for 349f without stopping  to improved commuinty ambulation    Time 4   Period Weeks   Status Achieved     PT LONG TERM GOAL #3   Title Pt to have acquired a compression pump and be using twice a day as instructed for maintanence phase of lymphedema.    Time 4   Period Weeks   Status On-going               Plan - 03/31/16 1159    Clinical Impression Statement LE measured with noted increased edema.  Pt verbalizes compliance of wearing compression garment.  Therapist believes that compression garments should contain swelling if they are being worn correctly.  Changed back to profore and toe wrapping for this treatment.    Rehab Potential Good   PT Frequency 3x / week   PT Duration 4 weeks   PT Treatment/Interventions ADLs/Self Care Home Management;Patient/family education;Manual techniques;Manual lymph drainage;Compression bandaging   PT Next Visit Plan If volume is back down attempt donning pt compression garment again.     Consulted and Agree with Plan of Care Patient      Patient will benefit from skilled therapeutic intervention in order to improve the following deficits and impairments:  Pain, Increased edema, Difficulty walking, Obesity, Decreased activity tolerance  Visit Diagnosis: No diagnosis  found.     Problem List Patient Active Problem List   Diagnosis Date Noted  . Left hand weakness 12/06/2015  . CAP (community acquired  pneumonia) 07/25/2015  . Pressure ulcer 05/02/2015  . Hyponatremia 05/01/2015  . Acute-on-chronic kidney injury ([place]) 05/01/2015  . Diabetes mellitus with hyperglycemia, with long-term current use of insulin ([place]) 05/01/2015  . COPD exacerbation ([place]) 04/30/2015  . Cellulitis of foot, right 03/24/2015  . Elevated d-dimer 03/24/2015  . Peripheral edema 12/04/2014  . Bilateral edema of lower extremity   . Diabetic ulcer of right great toe ([place])   . Foot ulcer due to secondary DM ([place]) 11/07/2014  . Diabetic ulcer of right foot ([place]) 11/07/2014  . Edema of lower extremity 05/27/2013  . Headache 05/26/2013  . CKD (chronic kidney disease), stage III 04/14/2013  . Leukocytosis 04/14/2013  . Anemia in CKD (chronic kidney disease) 04/14/2013  . Dyspnea 04/14/2013  . Chronic diastolic CHF (congestive heart failure) ([place])   . COPD (chronic obstructive pulmonary disease) ([place]) 08/07/2012  . Morbid obesity ([place]) 08/07/2012  . DM type 2 (diabetes mellitus, type 2) ([place]) 08/07/2012  . HTN (hypertension), benign 08/07/2012  . Hypothyroidism (acquired) 08/07/2012    Rayetta Humphrey, PT CLT [phone] 03/31/2016, 12:03 PM  [place] [address] [place], [name], [address] Phone: [phone]   Fax:  [phone]  Name: DANYLAH HOLDEN MRN: [id number] Date of Birth: 11/18/1950

## 2016-04-01 ENCOUNTER — Ambulatory Visit (HOSPITAL_COMMUNITY): Payer: Medicare Other | Admitting: Physical Therapy

## 2016-04-02 ENCOUNTER — Ambulatory Visit (HOSPITAL_COMMUNITY): Payer: Medicare Other | Admitting: Physical Therapy

## 2016-04-02 DIAGNOSIS — R262 Difficulty in walking, not elsewhere classified: Secondary | ICD-10-CM | POA: Diagnosis not present

## 2016-04-02 DIAGNOSIS — I890 Lymphedema, not elsewhere classified: Secondary | ICD-10-CM | POA: Diagnosis not present

## 2016-04-02 DIAGNOSIS — M79661 Pain in right lower leg: Secondary | ICD-10-CM | POA: Diagnosis not present

## 2016-04-02 DIAGNOSIS — M6281 Muscle weakness (generalized): Secondary | ICD-10-CM

## 2016-04-02 DIAGNOSIS — M79605 Pain in left leg: Secondary | ICD-10-CM | POA: Diagnosis not present

## 2016-04-02 DIAGNOSIS — R2689 Other abnormalities of gait and mobility: Secondary | ICD-10-CM | POA: Diagnosis not present

## 2016-04-02 DIAGNOSIS — J449 Chronic obstructive pulmonary disease, unspecified: Secondary | ICD-10-CM | POA: Diagnosis not present

## 2016-04-02 NOTE — Therapy (Signed)
[place], [name], [address] Phone: [phone]   Fax:  [phone]  Physical Therapy Treatment  Patient Details  Name: JALESIA LOUDENSLAGER MRN: [id number] Date of Birth: [birth year] Referring Provider: Barton Fanny  Encounter Date: 04/02/2016      PT End of Session - 04/02/16 1202    Visit Number 10   Number of Visits 12   Date for PT Re-Evaluation 04/04/16   Authorization Type UHC medicare   Authorization - Visit Number 10   Authorization - Number of Visits 12   PT Start Time 1761   PT Stop Time 1150   PT Time Calculation (min) 80 min   Activity Tolerance Patient tolerated treatment well   Behavior During Therapy [facility] for tasks assessed/performed      Past Medical History:  Diagnosis Date  . Anxiety   . CHF (congestive heart failure) (HCC)    Diastolic  . Chronic back pain   . COPD (chronic obstructive pulmonary disease) ([place])   . Degenerative disk disease   . Diabetes mellitus without complication ([place])   . Hypertension   . Hypothyroidism   . On home O2    2.5 L N/C prn  . Pedal edema     Past Surgical History:  Procedure Laterality Date  . HERNIA REPAIR    . TUBAL LIGATION      There were no vitals filed for this visit.      Subjective Assessment - 04/02/16 1200    Subjective Pt states that her toes on her right leg are sore.    Pertinent History PT has been hospitalized for cellulitis; COPD and weakness; Hx of HTN, DM, Asthma, COPD    Currently in Pain? Yes   Pain Score 2    Pain Location Toe (Comment which one)   Pain Orientation Right   Pain Descriptors / Indicators Tender   Pain Type Chronic pain   Pain Onset More than a month ago                         [facility] Adult PT Treatment/Exercise - 04/02/16 0001      Manual Therapy   Manual Therapy Compression Bandaging   Manual therapy comments done seperate from all other aspects of treatment    Manual Lymphatic  Drainage (MLD)  to include:  supraclavicular, deep and superficial abdominal,routing fluid using inguinal/axillary anastomosis as well as anterior and posterior aspect of LE.     Compression Bandaging toe wrapping bilaterally followed by profore application                   PT Short Term Goals - 04/02/16 1207      PT SHORT TERM GOAL #1   Title Pt pain in her feet  to decrease to no greater than a 5/10 to allow pt to ambulate with rolling walker and O2 with rolling walker.    Time 2   Period Weeks   Status Achieved     PT SHORT TERM GOAL #2   Title Pt to have no redness or signs of infection in her feet or legs    Time 1   Period Weeks   Status On-going     PT SHORT TERM GOAL #3   Title Pt to have acquired a long sponge to be able to cleanse lower legs and feet for decreased risk of infection    Time 2   Period  Weeks   Status On-going           PT Long Term Goals - 04/22/16 1208      PT LONG TERM GOAL #1   Title Pt fluid in bilateral LE to have decreased by 3 cm to allow pt to be able to don her compression stockings    Time 4   Period Weeks   Status On-going     PT LONG TERM GOAL #2   Title Pt pain in bilateral LE to be no greater than a 3/10 to allow pt to ambulate with her walker and O2 for 366f without stopping  to improved commuinty ambulation    Time 4   Period Weeks   Status Achieved     PT LONG TERM GOAL #3   Title Pt to have acquired a compression pump and be using twice a day as instructed for maintanence phase of lymphedema.    Time 4   Period Weeks   Status On-going               Plan - 1Dec 12, 20171203    Clinical Impression Statement Pt Rt second toe continues to be red and swollen but skin is intact.  Pt brought garment in which appears to be fraying at the seam.  Amy FLadon ApplebaumPTA took picture [place].  Therapist requested pt to bring garments without fraying in next treatment.    Rehab  Potential Good   PT Frequency 3x / week   PT Duration 4 weeks   PT Treatment/Interventions ADLs/Self Care Home Management;Patient/family education;Manual techniques;Manual lymph drainage;Compression bandaging   PT Next Visit Plan If second toe redness has decreased attempt to don patients personal compression garment.  Remeasure mid week.    Consulted and Agree with Plan of Care Patient      Patient will benefit from skilled therapeutic intervention in order to improve the following deficits and impairments:  Pain, Increased edema, Difficulty walking, Obesity, Decreased activity tolerance  Visit Diagnosis: Lymphedema, not elsewhere classified  Muscle weakness (generalized)  Difficulty in walking, not elsewhere classified       G-Codes - [id number]    Functional Assessment Tool Used clinical judgement:  swelling/pain   Functional Limitation Other PT primary   Other PT Primary Goal Status ((R4854 At least 40 percent but less than 60 percent impaired, limited or restricted   Other PT Primary Discharge Status ((O2703 At least 40 percent but less than 60 percent impaired, limited or restricted      Problem List Patient Active Problem List   Diagnosis Date Noted  . Left hand weakness 12/06/2015  . CAP (community acquired pneumonia) 07/25/2015  . Pressure ulcer 05/02/2015  . Hyponatremia 05/01/2015  . Acute-on-chronic kidney injury ([place] 05/01/2015  . Diabetes mellitus with hyperglycemia, with long-term current use of insulin ([place] 05/01/2015  . COPD exacerbation ([place] 04/30/2015  . Cellulitis of foot, right 03/24/2015  . Elevated d-dimer 03/24/2015  . Peripheral edema 12/04/2014  . Bilateral edema of lower extremity   . Diabetic ulcer of right great toe ([place]   . Foot ulcer due to secondary DM ([place] 11/07/2014  . Diabetic ulcer of right foot ([place] 11/07/2014  . Edema of lower extremity 05/27/2013  . Headache 05/26/2013  . CKD (chronic kidney disease), stage III 04/14/2013   . Leukocytosis 04/14/2013  . Anemia in CKD (chronic kidney disease) 04/14/2013  . Dyspnea 04/14/2013  . Chronic diastolic CHF (congestive heart  failure) ([place])   . COPD (chronic obstructive pulmonary disease) ([place]) 08/07/2012  . Morbid obesity ([place]) 08/07/2012  . DM type 2 (diabetes mellitus, type 2) ([place]) 08/07/2012  . HTN (hypertension), benign 08/07/2012  . Hypothyroidism (acquired) 08/07/2012   Rayetta Humphrey, PT CLT [phone] 04/02/2016, 12:11 PM  [place] [address] [place], [name], [address] Phone: [phone]   Fax:  [phone]  Name: BAYYINAH DUKEMAN MRN: [id number] Date of Birth: 05-13-1950

## 2016-04-07 ENCOUNTER — Ambulatory Visit (HOSPITAL_COMMUNITY)
Admission: RE | Admit: 2016-04-07 | Discharge: 2016-04-07 | Disposition: A | Discharge status: Home / Self Care | Payer: Medicare Other | Source: Ambulatory Visit | Attending: Internal Medicine | Admitting: Internal Medicine

## 2016-04-07 ENCOUNTER — Ambulatory Visit (HOSPITAL_COMMUNITY): Payer: Medicare Other | Admitting: Physical Therapy

## 2016-04-07 DIAGNOSIS — I890 Lymphedema, not elsewhere classified: Secondary | ICD-10-CM | POA: Diagnosis not present

## 2016-04-07 DIAGNOSIS — R262 Difficulty in walking, not elsewhere classified: Secondary | ICD-10-CM | POA: Diagnosis not present

## 2016-04-07 DIAGNOSIS — R2689 Other abnormalities of gait and mobility: Secondary | ICD-10-CM | POA: Diagnosis not present

## 2016-04-07 DIAGNOSIS — M6281 Muscle weakness (generalized): Secondary | ICD-10-CM | POA: Diagnosis not present

## 2016-04-07 DIAGNOSIS — I10 Essential (primary) hypertension: Secondary | ICD-10-CM | POA: Diagnosis not present

## 2016-04-07 DIAGNOSIS — Z780 Asymptomatic menopausal state: Secondary | ICD-10-CM | POA: Insufficient documentation

## 2016-04-07 DIAGNOSIS — M79605 Pain in left leg: Secondary | ICD-10-CM | POA: Diagnosis not present

## 2016-04-07 DIAGNOSIS — E039 Hypothyroidism, unspecified: Secondary | ICD-10-CM | POA: Diagnosis not present

## 2016-04-07 DIAGNOSIS — I509 Heart failure, unspecified: Secondary | ICD-10-CM | POA: Diagnosis not present

## 2016-04-07 DIAGNOSIS — E1143 Type 2 diabetes mellitus with diabetic autonomic (poly)neuropathy: Secondary | ICD-10-CM | POA: Diagnosis not present

## 2016-04-07 DIAGNOSIS — M85852 Other specified disorders of bone density and structure, left thigh: Secondary | ICD-10-CM | POA: Insufficient documentation

## 2016-04-07 DIAGNOSIS — M79661 Pain in right lower leg: Secondary | ICD-10-CM

## 2016-04-07 DIAGNOSIS — Z1231 Encounter for screening mammogram for malignant neoplasm of breast: Secondary | ICD-10-CM | POA: Insufficient documentation

## 2016-04-07 NOTE — Therapy (Signed)
[place], [name], [address] Phone: [phone]   Fax:  [phone]  Physical Therapy Treatment  Patient Details  Name: Stephanie Combs MRN: [id number] Date of Birth: Sep 24, 1950 Referring Provider: Barton Fanny  Encounter Date: 04/07/2016      PT End of Session - 04/07/16 1222    Visit Number 11   Number of Visits 12   Date for PT Re-Evaluation 04/04/16   Authorization Type UHC medicare   Authorization - Visit Number 11   Authorization - Number of Visits 12   PT Start Time 6948  pt is late   PT Stop Time 1200   PT Time Calculation (min) 75 min   Activity Tolerance Patient tolerated treatment well   Behavior During Therapy [facility] for tasks assessed/performed      Past Medical History:  Diagnosis Date  . Anxiety   . CHF (congestive heart failure) (HCC)    Diastolic  . Chronic back pain   . COPD (chronic obstructive pulmonary disease) ([place])   . Degenerative disk disease   . Diabetes mellitus without complication ([place])   . Hypertension   . Hypothyroidism   . On home O2    2.5 L N/C prn  . Pedal edema     Past Surgical History:  Procedure Laterality Date  . HERNIA REPAIR    . TUBAL LIGATION      There were no vitals filed for this visit.      Subjective Assessment - 04/07/16 1219    Subjective Pt states that her toes on her right leg continues to be sore.   Pertinent History PT has been hospitalized for cellulitis; COPD and weakness; Hx of HTN, DM, Asthma, COPD    Currently in Pain? Yes   Pain Score 4    Pain Location Toe (Comment which one)   Pain Orientation Right  2nd and 3rd   Pain Descriptors / Indicators Sore   Pain Type Chronic pain   Pain Radiating Towards none   Pain Onset More than a month ago   Aggravating Factors  swelling                         [facility] Adult PT Treatment/Exercise - 04/07/16 0001      Manual Therapy   Manual Therapy Compression Bandaging    Manual therapy comments done seperate from all other aspects of treatment    Manual Lymphatic Drainage (MLD)  to include:  supraclavicular, deep and superficial abdominal,routing fluid using inguinal/axillary anastomosis as well as anterior and posterior aspect of LE.     Compression Bandaging toe wrapping bilaterally followed by profore application                   PT Short Term Goals - 04/02/16 1207      PT SHORT TERM GOAL #1   Title Pt pain in her feet  to decrease to no greater than a 5/10 to allow pt to ambulate with rolling walker and O2 with rolling walker.    Time 2   Period Weeks   Status Achieved     PT SHORT TERM GOAL #2   Title Pt to have no redness or signs of infection in her feet or legs    Time 1   Period Weeks   Status On-going     PT SHORT TERM GOAL #3   Title Pt to have acquired a long sponge to  be able to cleanse lower legs and feet for decreased risk of infection    Time 2   Period Weeks   Status On-going           PT Long Term Goals - 04/02/16 1208      PT LONG TERM GOAL #1   Title Pt fluid in bilateral LE to have decreased by 3 cm to allow pt to be able to don her compression stockings    Time 4   Period Weeks   Status On-going     PT LONG TERM GOAL #2   Title Pt pain in bilateral LE to be no greater than a 3/10 to allow pt to ambulate with her walker and O2 for 352f without stopping  to improved commuinty ambulation    Time 4   Period Weeks   Status Achieved     PT LONG TERM GOAL #3   Title Pt to have acquired a compression pump and be using twice a day as instructed for maintanence phase of lymphedema.    Time 4   Period Weeks   Status On-going               Plan - 04/07/16 1223    Clinical Impression Statement Pt second toe is not as red or as swollen.  Pt did not bring her compression garments  Pt was told to bring her own compression garment to next treatment.     Rehab Potential Good   PT Frequency 3x / week    PT Duration 4 weeks   PT Treatment/Interventions ADLs/Self Care Home Management;Patient/family education;Manual techniques;Manual lymph drainage;Compression bandaging   PT Next Visit Plan Remeasure and don pt own compression garment.  Possible D/C in two visits   Consulted and Agree with Plan of Care Patient      Patient will benefit from skilled therapeutic intervention in order to improve the following deficits and impairments:  Pain, Increased edema, Difficulty walking, Obesity, Decreased activity tolerance  Visit Diagnosis: Lymphedema, not elsewhere classified  Muscle weakness (generalized)  Difficulty in walking, not elsewhere classified  Pain in right lower leg     Problem List Patient Active Problem List   Diagnosis Date Noted  . Left hand weakness 12/06/2015  . CAP (community acquired pneumonia) 07/25/2015  . Pressure ulcer 05/02/2015  . Hyponatremia 05/01/2015  . Acute-on-chronic kidney injury ([place] 05/01/2015  . Diabetes mellitus with hyperglycemia, with long-term current use of insulin ([place] 05/01/2015  . COPD exacerbation ([place] 04/30/2015  . Cellulitis of foot, right 03/24/2015  . Elevated d-dimer 03/24/2015  . Peripheral edema 12/04/2014  . Bilateral edema of lower extremity   . Diabetic ulcer of right great toe (HDutch [place]   . Foot ulcer due to secondary DM (HG[place] 11/07/2014  . Diabetic ulcer of right foot ([place] 11/07/2014  . Edema of lower extremity 05/27/2013  . Headache 05/26/2013  . CKD (chronic kidney disease), stage III 04/14/2013  . Leukocytosis 04/14/2013  . Anemia in CKD (chronic kidney disease) 04/14/2013  . Dyspnea 04/14/2013  . Chronic diastolic CHF (congestive heart failure) (HB[place]   . COPD (chronic obstructive pulmonary disease) ([place] 08/07/2012  . Morbid obesity ([place] 08/07/2012  . DM type 2 (diabetes mellitus, type 2) ([place] 08/07/2012  . HTN (hypertension), benign 08/07/2012  . Hypothyroidism (acquired) 08/07/2012    CRayetta Humphrey PT  CLT [phone]/27/2017, 12:32 PM  [place] [place] [address]Phone: [phone]  Fax:  [phone]  Name: Stephanie Combs MRN: [id number] Date of Birth: 09/23/1950

## 2016-04-09 ENCOUNTER — Telehealth (HOSPITAL_COMMUNITY): Payer: Self-pay | Admitting: Physical Therapy

## 2016-04-09 ENCOUNTER — Ambulatory Visit (HOSPITAL_COMMUNITY): Payer: Medicare Other | Admitting: Physical Therapy

## 2016-04-09 DIAGNOSIS — R2689 Other abnormalities of gait and mobility: Secondary | ICD-10-CM | POA: Diagnosis not present

## 2016-04-09 DIAGNOSIS — I890 Lymphedema, not elsewhere classified: Secondary | ICD-10-CM

## 2016-04-09 DIAGNOSIS — R262 Difficulty in walking, not elsewhere classified: Secondary | ICD-10-CM | POA: Diagnosis not present

## 2016-04-09 DIAGNOSIS — M6281 Muscle weakness (generalized): Secondary | ICD-10-CM | POA: Diagnosis not present

## 2016-04-09 DIAGNOSIS — M79661 Pain in right lower leg: Secondary | ICD-10-CM | POA: Diagnosis not present

## 2016-04-09 DIAGNOSIS — M79605 Pain in left leg: Secondary | ICD-10-CM | POA: Diagnosis not present

## 2016-04-09 NOTE — Telephone Encounter (Signed)
Called [employer] [place]) and left message to call clinic regarding patient's pump order.  Pt reports several calls from company requesting her physical address to drop shipment to.  Pt states she has given it to them but still has not received her pump Physical address is [address] [place].  Pt states make sure Edwena Felty has 2 R's because there is another Loraine in [place].  Teena Irani, PTA/CLT [phone]

## 2016-04-09 NOTE — Addendum Note (Signed)
Addended by: Leeroy Cha on: 04/09/2016 12:28 PM   Modules accepted: Orders

## 2016-04-09 NOTE — Therapy (Signed)
[place], [name], [address] Phone: [phone]   Fax:  [phone]  Physical Therapy Treatment  Patient Details  Name: Stephanie Combs MRN: [id number] Date of Birth: [birth year] Referring Provider: Barton Fanny  Encounter Date: 04/09/2016      PT End of Session - 04/09/16 1146    Visit Number 12   Number of Visits 12   Date for PT Re-Evaluation 04/04/16   Authorization Type [employer] medicare   Authorization - Visit Number 12   Authorization - Number of Visits 12   PT Start Time 6962  pt was late for appt   PT Stop Time 1140   PT Time Calculation (min) 55 min   Activity Tolerance Patient tolerated treatment well   Behavior During Therapy [facility] for tasks assessed/performed      Past Medical History:  Diagnosis Date  . Anxiety   . CHF (congestive heart failure) (HCC)    Diastolic  . Chronic back pain   . COPD (chronic obstructive pulmonary disease) ([place])   . Degenerative disk disease   . Diabetes mellitus without complication ([place])   . Hypertension   . Hypothyroidism   . On home O2    2.5 L N/C prn  . Pedal edema     Past Surgical History:  Procedure Laterality Date  . HERNIA REPAIR    . TUBAL LIGATION      There were no vitals filed for this visit.      Subjective Assessment - 04/09/16 1144    Subjective Pt states her feet are sore and thinks they are bleeding.  No pain just soreness.   Currently in Pain? No/denies               LYMPHEDEMA/ONCOLOGY QUESTIONNAIRE - 04/09/16 1047      Right Lower Extremity Lymphedema   30 cm Proximal to Floor at Lateral Plantar Foot 47.3 cm   20 cm Proximal to Floor at Lateral Plantar Foot 38.'7 1   10 '$ cm Proximal to Floor at Lateral Malleoli 27 cm   Circumference of ankle/heel 33.5 cm.   5 cm Proximal to 1st MTP Joint 25.3 cm   Across MTP Joint 25.5 cm   Around Proximal Great Toe 10 cm     Left Lower Extremity Lymphedema   At  Midpatella/Popliteal Crease 43.5 cm   30 cm Proximal to Floor at Lateral Plantar Foot 45 cm   20 cm Proximal to Floor at Lateral Plantar Foot 39 cm   10 cm Proximal to Floor at Lateral Malleoli 27.3 cm   Circumference of ankle/heel 33.2 cm.   5 cm Proximal to 1st MTP Joint 24.2 cm   Across MTP Joint 24.7 cm   Around Proximal Great Toe 8.7 cm                  [facility] Adult PT Treatment/Exercise - 04/09/16 0001      Manual Therapy   Manual Therapy Other (comment);Manual Lymphatic Drainage (MLD)   Manual therapy comments done seperate from all other aspects of treatment    Manual Lymphatic Drainage (MLD)  to include:  supraclavicular, deep and superficial abdominal,routing fluid using inguinal/axillary anastomosis as well as anterior and posterior aspect of LE.     Other Manual Therapy donning of knee high compression garments                PT Education - 04/09/16 1151    Education provided Yes  Education Details Must wear her garments everyday.  Don in the mornings and Remove in the evenings to wash and moisturize   Person(s) Educated Patient   Methods Explanation   Comprehension Verbalized understanding          PT Short Term Goals - 04/02/16 1207      PT SHORT TERM GOAL #1   Title Pt pain in her feet  to decrease to no greater than a 5/10 to allow pt to ambulate with rolling walker and O2 with rolling walker.    Time 2   Period Weeks   Status Achieved     PT SHORT TERM GOAL #2   Title Pt to have no redness or signs of infection in her feet or legs    Time 1   Period Weeks   Status On-going     PT SHORT TERM GOAL #3   Title Pt to have acquired a long sponge to be able to cleanse lower legs and feet for decreased risk of infection    Time 2   Period Weeks   Status On-going           PT Long Term Goals - 04/02/16 1208      PT LONG TERM GOAL #1   Title Pt fluid in bilateral LE to have decreased by 3 cm to allow pt to be able to don her  compression stockings    Time 4   Period Weeks   Status On-going     PT LONG TERM GOAL #2   Title Pt pain in bilateral LE to be no greater than a 3/10 to allow pt to ambulate with her walker and O2 for 33f without stopping  to improved commuinty ambulation    Time 4   Period Weeks   Status Achieved     PT LONG TERM GOAL #3   Title Pt to have acquired a compression pump and be using twice a day as instructed for maintanence phase of lymphedema.    Time 4   Period Weeks   Status On-going               Plan - 04/09/16 1146    Clinical Impression Statement Profores and toe bandages removed revealing decompressed LE's/feet/toes.  Very slight redness Rt second toe but no other areas of broken down skin or concern.  Manual completed for bilateral LE's and donning of compression garments bilaterally.  Educated, once again that these must be worn daily.  Donning in the morning and doffing in the evening to moisturize.  Pt verbalized understanding and expressed she has a sock butler to get them on independently and also has a neighbor that would help.  Pt verbalized she still has not received pump.  Call made to MJosephine Igo([facility] and left message to return call.  Bilateral LE's remeasured this session with good decompression noted.  Good fit of hose as well.     Rehab Potential Good   PT Frequency 3x / week   PT Duration 4 weeks   PT Treatment/Interventions ADLs/Self Care Home Management;Patient/family education;Manual techniques;Manual lymph drainage;Compression bandaging   PT Next Visit Plan Remeasure and ensure good results with compression garments.  Possible D/C next session.    Consulted and Agree with Plan of Care Patient      Patient will benefit from skilled therapeutic intervention in order to improve the following deficits and impairments:  Pain, Increased edema, Difficulty walking, Obesity, Decreased activity tolerance  Visit Diagnosis:  Lymphedema, not elsewhere  classified     Problem List Patient Active Problem List   Diagnosis Date Noted  . Left hand weakness 12/06/2015  . CAP (community acquired pneumonia) 07/25/2015  . Pressure ulcer 05/02/2015  . Hyponatremia 05/01/2015  . Acute-on-chronic kidney injury ([place]) 05/01/2015  . Diabetes mellitus with hyperglycemia, with long-term current use of insulin ([place]) 05/01/2015  . COPD exacerbation ([place]) 04/30/2015  . Cellulitis of foot, right 03/24/2015  . Elevated d-dimer 03/24/2015  . Peripheral edema 12/04/2014  . Bilateral edema of lower extremity   . Diabetic ulcer of right great toe ([place])   . Foot ulcer due to secondary DM ([place]) 11/07/2014  . Diabetic ulcer of right foot ([place]) 11/07/2014  . Edema of lower extremity 05/27/2013  . Headache 05/26/2013  . CKD (chronic kidney disease), stage III 04/14/2013  . Leukocytosis 04/14/2013  . Anemia in CKD (chronic kidney disease) 04/14/2013  . Dyspnea 04/14/2013  . Chronic diastolic CHF (congestive heart failure) ([place])   . COPD (chronic obstructive pulmonary disease) ([place]) 08/07/2012  . Morbid obesity ([place]) 08/07/2012  . DM type 2 (diabetes mellitus, type 2) ([place]) 08/07/2012  . HTN (hypertension), benign 08/07/2012  . Hypothyroidism (acquired) 08/07/2012    Teena Irani, PTA/CLT [phone]  04/09/2016, 11:52 AM  [facility] [address] [place], [name], [address] Phone: [phone]   Fax:  [phone]  Name: Stephanie Combs MRN: [id number] Date of Birth: 1951/02/12

## 2016-04-11 ENCOUNTER — Ambulatory Visit (HOSPITAL_COMMUNITY): Payer: Medicare Other | Attending: Neurology | Admitting: Physical Therapy

## 2016-04-11 DIAGNOSIS — I890 Lymphedema, not elsewhere classified: Secondary | ICD-10-CM | POA: Insufficient documentation

## 2016-04-11 NOTE — Therapy (Signed)
[place], [name], [address] Phone: [phone]   Fax:  [phone]  Physical Therapy Treatment  Patient Details  Name: Stephanie Combs MRN: [id number] Date of Birth: [birth year] Referring Provider: Barton Fanny  Encounter Date: 04/11/2016      PT End of Session - 04/11/16 1154    Visit Number 13   Number of Visits 13   Date for PT Re-Evaluation 04/04/16   Authorization Type [employer]HC medicare   Authorization - Visit Number 13   Authorization - Number of Visits 13   PT Start Time 2449   PT Stop Time 1145   PT Time Calculation (min) 60 min   Activity Tolerance Patient tolerated treatment well   Behavior During Therapy [facility] for tasks assessed/performed      Past Medical History:  Diagnosis Date  . Anxiety   . CHF (congestive heart failure) (HCC)    Diastolic  . Chronic back pain   . COPD (chronic obstructive pulmonary disease) ([place])   . Degenerative disk disease   . Diabetes mellitus without complication ([place])   . Hypertension   . Hypothyroidism   . On home O2    2.5 L N/C prn  . Pedal edema     Past Surgical History:  Procedure Laterality Date  . HERNIA REPAIR    . TUBAL LIGATION      There were no vitals filed for this visit.      Subjective Assessment - 04/11/16 1051    Subjective Pt states her feet are sore in the bones.   No pain just soreness.   Currently in Pain? No/denies            [facility] PT Assessment - 04/11/16 0001      Assessment   Medical Diagnosis nonhealing wounds with lymphedema   Referring Provider Barton Fanny   Onset Date/Surgical Date 02/04/16   Hand Dominance Right   Next MD Visit unscheduled   Prior Therapy in the past for cellulitis and lymphedema      Precautions   Precautions None     Restrictions   Weight Bearing Restrictions No     [place] residence     Prior Function   Level of Independence Independent with  basic ADLs   Vocation Retired   Leisure dancing      Cognition   Overall Cognitive Status Within Functional Limits for tasks assessed     Observation/Other Assessments   Other Surveys  --  Lymphedema life impact scale  44/100 was 60/100           LYMPHEDEMA/ONCOLOGY QUESTIONNAIRE - 04/11/16 1052      Right Lower Extremity Lymphedema   At Midpatella/Popliteal Crease 47.2 cm   30 cm Proximal to Floor at Lateral Plantar Foot 49.3 cm   20 cm Proximal to Floor at Lateral Plantar Foot 41.'6 1   10 ' cm Proximal to Floor at Lateral Malleoli 28.8 cm   Circumference of ankle/heel 34.4 cm.   5 cm Proximal to 1st MTP Joint 27.2 cm   Across MTP Joint 26.8 cm   Around Proximal Great Toe 11.8 cm     Left Lower Extremity Lymphedema   At Midpatella/Popliteal Crease 44.5 cm   30 cm Proximal to Floor at Lateral Plantar Foot 46 cm   20 cm Proximal to Floor at Lateral Plantar Foot 39.5 cm   10 cm Proximal to Floor at Lateral Malleoli 28.7 cm  Circumference of ankle/heel 34 cm.   5 cm Proximal to 1st MTP Joint 26.2 cm   Across MTP Joint 27.5 cm   Around Proximal Great Toe 10.8 cm                  [facility] Adult PT Treatment/Exercise - 04/11/16 0001      Manual Therapy   Manual Therapy Other (comment);Manual Lymphatic Drainage (MLD)   Manual therapy comments done seperate from all other aspects of treatment    Manual Lymphatic Drainage (MLD)  to include:  supraclavicular, deep and superficial abdominal,routing fluid using inguinal/axillary anastomosis as well as anterior and posterior aspect of LE.     Other Manual Therapy donning of knee high compression garments                  PT Short Term Goals - 04/11/16 1158      PT SHORT TERM GOAL #1   Title Pt pain in her feet  to decrease to no greater than a 5/10 to allow pt to ambulate with rolling walker and O2 with rolling walker.    Time 2   Period Weeks   Status Achieved     PT SHORT TERM GOAL #2   Title Pt to have  no redness or signs of infection in her feet or legs    Baseline noted increased redness of left big toe but pt states that it has always been like that.  Therapist urged pt to keep an eye on it.     Time 1   Period Weeks   Status Partially Met     PT SHORT TERM GOAL #3   Title Pt to have acquired a long sponge to be able to cleanse lower legs and feet for decreased risk of infection    Time 2   Period Weeks   Status Not Met           PT Long Term Goals - 04/11/16 1159      PT LONG TERM GOAL #1   Title Pt fluid in bilateral LE to have decreased by 3 cm to allow pt to be able to don her compression stockings    Time 4   Period Weeks   Status Achieved     PT LONG TERM GOAL #2   Title Pt pain in bilateral LE to be no greater than a 3/10 to allow pt to ambulate with her walker and O2 for 345f without stopping  to improved commuinty ambulation    Time 4   Period Weeks   Status Achieved     PT LONG TERM GOAL #3   Title Pt to have acquired a compression pump and be using twice a day as instructed for maintanence phase of lymphedema.    Baseline Pt has been contacted by company who state that they have sent the pump out.  Pt is awaiting arrival.    Time 4   Period Weeks   Status Partially Met               Plan - 04/11/16 1156    Clinical Impression Statement Pt LE measured again following use of pt personal compression garment rather than profore and LE continue to decrease in volume.  LE are smaller than they have ever been.  All goals have been met and pt is ready for discharge at this time.    Rehab Potential Good   PT Frequency 3x / week   PT Duration  4 weeks   PT Treatment/Interventions ADLs/Self Care Home Management;Patient/family education;Manual techniques;Manual lymph drainage;Compression bandaging   PT Next Visit Plan discharge to self care.    Consulted and Agree with Plan of Care Patient      Patient will benefit from skilled therapeutic intervention in  order to improve the following deficits and impairments:  Pain, Increased edema, Difficulty walking, Obesity, Decreased activity tolerance  Visit Diagnosis: Lymphedema, not elsewhere classified       G-Codes - May 10, 2016 1200    Functional Assessment Tool Used based on lymphedema impact score.    Functional Limitation Other PT primary   Other PT Primary Goal Status (Z6109) At least 40 percent but less than 60 percent impaired, limited or restricted   Other PT Primary Discharge Status [phone]) At least 40 percent but less than 60 percent impaired, limited or restricted      Problem List Patient Active Problem List   Diagnosis Date Noted  . Left hand weakness 12/06/2015  . CAP (community acquired pneumonia) 07/25/2015  . Pressure ulcer 05/02/2015  . Hyponatremia 05/01/2015  . Acute-on-chronic kidney injury ([place]) 05/01/2015  . Diabetes mellitus with hyperglycemia, with long-term current use of insulin ([place]) 05/01/2015  . COPD exacerbation ([place]) 04/30/2015  . Cellulitis of foot, right 03/24/2015  . Elevated d-dimer 03/24/2015  . Peripheral edema 12/04/2014  . Bilateral edema of lower extremity   . Diabetic ulcer of right great toe ([place])   . Foot ulcer due to secondary DM ([place]) 11/07/2014  . Diabetic ulcer of right foot ([place]) 11/07/2014  . Edema of lower extremity 05/27/2013  . Headache 05/26/2013  . CKD (chronic kidney disease), stage III 04/14/2013  . Leukocytosis 04/14/2013  . Anemia in CKD (chronic kidney disease) 04/14/2013  . Dyspnea 04/14/2013  . Chronic diastolic CHF (congestive heart failure) ([place])   . COPD (chronic obstructive pulmonary disease) ([place]) 08/07/2012  . Morbid obesity ([place]) 08/07/2012  . DM type 2 (diabetes mellitus, type 2) ([place]) 08/07/2012  . HTN (hypertension), benign 08/07/2012  . Hypothyroidism (acquired) 08/07/2012    Rayetta Humphrey, PT CLT [phone] May 10, 2016, 12:01 PM  [facility] [address]  [place], [name], [address] Phone: [phone]   Fax:  [phone]  Name: SHATYRA BECKA MRN: [id number] Date of Birth: 10-15-1950  PHYSICAL THERAPY DISCHARGE SUMMARY  Visits from Start of Care: 13  Current functional level related to goals / functional outcomes: See above   Remaining deficits: See above    Education / Equipment: Importance of wearing compression garment Plan: Patient agrees to discharge.  Patient goals were partially met. Patient is being discharged due to being pleased with the current functional level.  ?????        Rayetta Humphrey, Delia CLT [phone]

## 2016-04-15 DIAGNOSIS — B351 Tinea unguium: Secondary | ICD-10-CM | POA: Diagnosis not present

## 2016-04-15 DIAGNOSIS — E1142 Type 2 diabetes mellitus with diabetic polyneuropathy: Secondary | ICD-10-CM | POA: Diagnosis not present

## 2016-04-15 DIAGNOSIS — L84 Corns and callosities: Secondary | ICD-10-CM | POA: Diagnosis not present

## 2016-04-16 ENCOUNTER — Ambulatory Visit (HOSPITAL_COMMUNITY): Payer: Medicare Other | Admitting: Physical Therapy

## 2016-04-16 DIAGNOSIS — E782 Mixed hyperlipidemia: Secondary | ICD-10-CM | POA: Diagnosis not present

## 2016-04-16 DIAGNOSIS — I10 Essential (primary) hypertension: Secondary | ICD-10-CM | POA: Diagnosis not present

## 2016-04-16 DIAGNOSIS — E039 Hypothyroidism, unspecified: Secondary | ICD-10-CM | POA: Diagnosis not present

## 2016-04-16 DIAGNOSIS — E1140 Type 2 diabetes mellitus with diabetic neuropathy, unspecified: Secondary | ICD-10-CM | POA: Diagnosis not present

## 2016-04-23 ENCOUNTER — Encounter (HOSPITAL_COMMUNITY): Payer: Medicare Other | Admitting: Physical Therapy

## 2016-04-23 DIAGNOSIS — M5416 Radiculopathy, lumbar region: Secondary | ICD-10-CM | POA: Diagnosis not present

## 2016-04-28 ENCOUNTER — Telehealth: Payer: Self-pay | Admitting: Adult Health

## 2016-04-28 DIAGNOSIS — E1140 Type 2 diabetes mellitus with diabetic neuropathy, unspecified: Secondary | ICD-10-CM | POA: Diagnosis not present

## 2016-04-28 DIAGNOSIS — E039 Hypothyroidism, unspecified: Secondary | ICD-10-CM | POA: Diagnosis not present

## 2016-04-28 NOTE — Telephone Encounter (Signed)
Patient is requesting appointment with Curt Bears for "severe chest pain". Was advised that Curt Bears did not have any appointments until next week. Please advise patient that she may need to go to ER. / tg

## 2016-04-28 NOTE — Telephone Encounter (Signed)
Pt reports she had CP with N/V last night,took NTG and only got HA.Today has SSCP and was advised to got to the ED.She states she will

## 2016-04-30 ENCOUNTER — Encounter (HOSPITAL_COMMUNITY): Payer: Medicare Other | Admitting: Physical Therapy

## 2016-04-30 DIAGNOSIS — E1140 Type 2 diabetes mellitus with diabetic neuropathy, unspecified: Secondary | ICD-10-CM | POA: Diagnosis not present

## 2016-04-30 DIAGNOSIS — E039 Hypothyroidism, unspecified: Secondary | ICD-10-CM | POA: Diagnosis not present

## 2016-04-30 DIAGNOSIS — I10 Essential (primary) hypertension: Secondary | ICD-10-CM | POA: Diagnosis not present

## 2016-05-02 DIAGNOSIS — J449 Chronic obstructive pulmonary disease, unspecified: Secondary | ICD-10-CM | POA: Diagnosis not present

## 2016-05-07 ENCOUNTER — Encounter (HOSPITAL_COMMUNITY): Payer: Medicare Other | Admitting: Physical Therapy

## 2016-05-14 ENCOUNTER — Encounter (HOSPITAL_COMMUNITY): Payer: Medicare Other | Admitting: Physical Therapy

## 2016-05-16 ENCOUNTER — Ambulatory Visit (INDEPENDENT_AMBULATORY_CARE_PROVIDER_SITE_OTHER): Payer: Medicare Other | Admitting: "Endocrinology

## 2016-05-16 ENCOUNTER — Encounter: Payer: Self-pay | Admitting: "Endocrinology

## 2016-05-16 ENCOUNTER — Encounter (HOSPITAL_COMMUNITY): Payer: Medicare Other | Admitting: Physical Therapy

## 2016-05-16 VITALS — BP 121/75 | HR 60

## 2016-05-16 DIAGNOSIS — E1165 Type 2 diabetes mellitus with hyperglycemia: Secondary | ICD-10-CM | POA: Diagnosis not present

## 2016-05-16 DIAGNOSIS — E1140 Type 2 diabetes mellitus with diabetic neuropathy, unspecified: Secondary | ICD-10-CM | POA: Diagnosis not present

## 2016-05-16 DIAGNOSIS — Z794 Long term (current) use of insulin: Secondary | ICD-10-CM

## 2016-05-16 DIAGNOSIS — N183 Chronic kidney disease, stage 3 (moderate): Secondary | ICD-10-CM

## 2016-05-16 DIAGNOSIS — IMO0002 Reserved for concepts with insufficient information to code with codable children: Secondary | ICD-10-CM

## 2016-05-16 DIAGNOSIS — E1122 Type 2 diabetes mellitus with diabetic chronic kidney disease: Secondary | ICD-10-CM

## 2016-05-16 DIAGNOSIS — I10 Essential (primary) hypertension: Secondary | ICD-10-CM

## 2016-05-16 DIAGNOSIS — E039 Hypothyroidism, unspecified: Secondary | ICD-10-CM

## 2016-05-16 NOTE — Progress Notes (Signed)
Subjective:    Patient ID: Stephanie Combs, female    DOB: 27-Apr-1951. Patient is being seen in consultation for management of diabetes requested by  [name], MD  Past Medical History:  Diagnosis Date  . Anxiety   . CHF (congestive heart failure) (HCC)    Diastolic  . Chronic back pain   . COPD (chronic obstructive pulmonary disease) ([place])   . Degenerative disk disease   . Diabetes mellitus without complication ([place])   . Hypertension   . Hypothyroidism   . On home O2    2.5 L N/C prn  . Pedal edema    Past Surgical History:  Procedure Laterality Date  . HERNIA REPAIR    . TUBAL LIGATION     Social History   Social History  . Marital status: Widowed    Spouse name: N/A  . Number of children: N/A  . Years of education: N/A   Social History Main Topics  . Smoking status: Former Smoker    Packs/day: 0.25    Types: Cigarettes    Quit date: 06/27/2015  . Smokeless tobacco: Never Used  . Alcohol use No  . Drug use: No  . Sexual activity: Not Currently    Birth control/ protection: Surgical   Other Topics Concern  . Not on file   Social History Narrative  . No narrative on file   Outpatient Encounter Prescriptions as of 05/16/2016  Medication Sig  . Insulin Detemir (LEVEMIR [place]) Inject 40 Units into the skin at bedtime.  . insulin lispro (HUMALOG) 100 UNIT/ML injection Inject 10-16 Units into the skin 3 (three) times daily before meals.  Marland Kitchen levothyroxine (SYNTHROID, LEVOTHROID) 75 MCG tablet Take 75 mcg by mouth daily before breakfast.  . Liraglutide (VICTOZA [place]) Inject 0.6 mg into the skin daily after breakfast.  . [DISCONTINUED] insulin lispro (HUMALOG KWIKPEN) 100 UNIT/ML KiwkPen Inject 10-16 Units into the skin 3 (three) times daily with meals.  Marland Kitchen albuterol (PROAIR HFA) 108 (90 BASE) MCG/ACT inhaler Inhale 1 puff into the lungs every 4 (four) hours as needed. For shortness of breath (Patient taking differently: Inhale 2 puffs into the lungs every 4 (four) hours  as needed. For shortness of breath)  . alprazolam (XANAX) 2 MG tablet Take 2 mg by mouth 3 (three) times daily as needed for sleep or anxiety.  Marland Kitchen aspirin EC 81 MG tablet Take 81 mg by mouth daily.  Marland Kitchen diltiazem (CARDIZEM CD) 240 MG 24 hr capsule Take 1 capsule (240 mg total) by mouth daily.  . furosemide (LASIX) 20 MG tablet Take 2 tablets (40 mg total) by mouth 2 (two) times daily.  Marland Kitchen gabapentin (NEURONTIN) 400 MG capsule Take 3 capsules (1,200 mg total) by mouth 2 (two) times daily. (Patient taking differently: Take 1,600 mg by mouth 2 (two) times daily. )  . nystatin (MYCOSTATIN/NYSTOP) 100000 UNIT/GM POWD Apply 1 g topically 2 (two) times daily.  . Omega-3 Fatty Acids (FISH OIL PO) Take 1 capsule by mouth daily.   . ondansetron (ZOFRAN ODT) 8 MG disintegrating tablet Take 1 tablet (8 mg total) by mouth every 8 (eight) hours as needed for nausea or vomiting. (Patient taking differently: Take 8 mg by mouth daily. )  . oxyCODONE-acetaminophen (PERCOCET/ROXICET) 5-325 MG tablet Take 2 tablets by mouth every 6 (six) hours as needed for moderate pain. (Patient taking differently: Take 1 tablet by mouth every 8 (eight) hours as needed for moderate pain. )  . potassium chloride (K-DUR) 10 MEQ  tablet Take 20 mEq by mouth 2 (two) times daily.   . ranitidine (ZANTAC) 150 MG tablet Take 150 mg by mouth daily.  . [DISCONTINUED] empagliflozin (JARDIANCE) 25 MG TABS tablet Take 25 mg by mouth daily.  . [DISCONTINUED] insulin aspart (NOVOLOG) 100 UNIT/ML injection Inject 20 Units into the skin 2 (two) times daily. Sliding scale.  . [DISCONTINUED] insulin detemir (LEVEMIR) 100 UNIT/ML injection Inject 0.2 mLs (20 Units total) into the skin at bedtime. (Patient taking differently: Inject 40 Units into the skin at bedtime. )  . [DISCONTINUED] levothyroxine (SYNTHROID, LEVOTHROID) 125 MCG tablet Take 2 tablets (250 mcg total) by mouth daily before breakfast.  . [DISCONTINUED] metFORMIN (GLUCOPHAGE) 500 MG tablet Take  500 mg by mouth 2 (two) times daily with a meal.   No facility-administered encounter medications on file as of 05/16/2016.    ALLERGIES: Allergies  Allergen Reactions  . Dilaudid [Hydromorphone Hcl] Itching  . Actifed Cold-Allergy [Chlorpheniramine-Phenylephrine]     "I was sick and red and it didn't agree with me at all"  . Doxycycline Nausea And Vomiting  . Other Cough    Pt states she is allergic to ragweed and that she starts coughing and sneezing like crazy  . Penicillins Hives and Itching    Has patient had a PCN reaction causing immediate rash, facial/tongue/throat swelling, SOB or lightheadedness with hypotension: Yes Has patient had a PCN reaction causing severe rash involving mucus membranes or skin necrosis: No Has patient had a PCN reaction that required hospitalization Yes Has patient had a PCN reaction occurring within the last 10 years: No If all of the above answers are "NO", then may proceed with Cephalosporin use.   . Reglan [Metoclopramide] Itching  . Valium [Diazepam] Itching  . Vistaril [Hydroxyzine Hcl] Itching   VACCINATION STATUS: Immunization History  Administered Date(s) Administered  . Pneumococcal Polysaccharide-23 08/08/2012    Diabetes  She presents for her initial diabetic visit. She has type 2 diabetes mellitus. Onset time: She was diagnosed at approximate age of 61 years. Her disease course has been worsening. There are no hypoglycemic associated symptoms. Pertinent negatives for hypoglycemia include no confusion, headaches, pallor or seizures. Associated symptoms include blurred vision, fatigue, polydipsia and polyuria. Pertinent negatives for diabetes include no chest pain and no polyphagia. There are no hypoglycemic complications. Symptoms are worsening. Diabetic complications include a CVA, heart disease, nephropathy, peripheral neuropathy, PVD and retinopathy. Risk factors for coronary artery disease include diabetes mellitus, dyslipidemia,  hypertension, obesity and sedentary lifestyle. Current diabetic treatment includes insulin injections (She is not very clear on her medications. Her medication list in the referral packet includes Levemir, Humalog, Giardia, metformin.). Her weight is decreasing steadily. She is following a generally unhealthy diet. When asked about meal planning, she reported none. She has not had a previous visit with a dietitian. She never participates in exercise. (She did not bring any meter nor logs to review today. She admits that she is she does not monitor blood glucose regularly. )  Hyperlipidemia  This is a chronic problem. The current episode started more than 1 year ago. Exacerbating diseases include diabetes and obesity. Pertinent negatives include no chest pain, myalgias or shortness of breath. Risk factors for coronary artery disease include diabetes mellitus, dyslipidemia, hypertension, obesity, a sedentary lifestyle and post-menopausal.  Hypertension  This is a chronic problem. The current episode started more than 1 year ago. Associated symptoms include blurred vision. Pertinent negatives include no chest pain, headaches, palpitations or shortness of breath. Risk  factors for coronary artery disease include diabetes mellitus, dyslipidemia, family history, obesity, sedentary lifestyle and smoking/tobacco exposure. Hypertensive end-organ damage includes CVA, PVD and retinopathy.       Review of Systems  Constitutional: Positive for fatigue. Negative for chills, fever and unexpected weight change.  HENT: Negative for trouble swallowing and voice change.   Eyes: Positive for blurred vision. Negative for visual disturbance.  Respiratory: Negative for cough, shortness of breath and wheezing.   Cardiovascular: Negative for chest pain, palpitations and leg swelling.  Gastrointestinal: Negative for diarrhea, nausea and vomiting.  Endocrine: Positive for polydipsia and polyuria. Negative for cold  intolerance, heat intolerance and polyphagia.  Musculoskeletal: Negative for arthralgias and myalgias.  Skin: Negative for color change, pallor, rash and wound.  Neurological: Negative for seizures and headaches.  Psychiatric/Behavioral: Negative for confusion and suicidal ideas.    Objective:    BP 121/75   Pulse 60   Wt Readings from Last 3 Encounters:  12/06/15 265 lb 14 oz (120.6 kg)  09/07/15 265 lb (120.2 kg)  07/26/15 265 lb 14 oz (120.6 kg)    Physical Exam  Constitutional: She is oriented to person, place, and time. She appears well-developed.  HENT:  Head: Normocephalic and atraumatic.  Eyes: EOM are normal.  Neck: Normal range of motion. Neck supple. No tracheal deviation present. No thyromegaly present.  Cardiovascular: Normal rate and regular rhythm.   Pulmonary/Chest: Effort normal and breath sounds normal.  Abdominal: Soft. Bowel sounds are normal. There is no tenderness. There is no guarding.  Musculoskeletal: She exhibits edema and deformity.  As large bilateral lower extremities due to lymphedema. He uses a walker to get around.  Neurological: She is alert and oriented to person, place, and time. She has normal reflexes. No cranial nerve deficit. Coordination normal.  Skin: Skin is warm and dry. No rash noted. No erythema. No pallor.  Psychiatric: She has a normal mood and affect.  Reluctant affect. She is difficult to engage.     CMP ( most recent) CMP     Component Value Date/Time   NA 140 12/05/2015 2350   K 4.1 12/05/2015 2350   CL 111 12/05/2015 2350   CO2 24 12/05/2015 2350   GLUCOSE 221 (H) 12/05/2015 2350   BUN 32 (H) 12/05/2015 2350   CREATININE 1.55 (H) 12/05/2015 2350   CALCIUM 8.8 (L) 12/05/2015 2350   PROT 6.9 12/05/2015 2350   ALBUMIN 4.0 12/05/2015 2350   AST 16 12/05/2015 2350   ALT 11 (L) 12/05/2015 2350   ALKPHOS 88 12/05/2015 2350   BILITOT 0.4 12/05/2015 2350   GFRNONAA 34 (L) 12/05/2015 2350   GFRAA 39 (L) 12/05/2015 2350      Diabetic Labs (most recent): Lab Results  Component Value Date   HGBA1C 9.6 (H) 12/05/2015   HGBA1C 8.9 (H) 04/30/2015   HGBA1C 8.5 (H) 11/07/2014     Lipid Panel ( most recent) Lipid Panel     Component Value Date/Time   CHOL 186 12/06/2015 0603   TRIG 185 (H) 12/06/2015 0603   HDL 39 (L) 12/06/2015 0603   CHOLHDL 4.8 12/06/2015 0603   VLDL 37 12/06/2015 0603   LDLCALC 110 (H) 12/06/2015 0603       Assessment & Plan:   1. Uncontrolled type 2 diabetes mellitus with stage 3 chronic kidney disease, with long-term current use of insulin ([place])  - Patient has currently uncontrolled symptomatic type 2 DM since  66 years of age,  with most recent A1c of  7.2% from October 2017.  - She states she had recent labs at her primary care doctor's office, we will request for a copy.   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 These are all discussed in detail with the patient.  - I have counseled the patient on diet management and weight loss, by adopting a carbohydrate restricted/protein rich diet.  - Suggestion is made for patient to avoid simple carbohydrates   from their diet including Cakes , Desserts, Ice Cream,  Soda (  diet and regular) , Sweet Tea , Candies,  Chips, Cookies, Artificial Sweeteners,   and "Sugar-free" Products . This will help patient to have stable blood glucose profile and potentially avoid unintended weight gain.  - I encouraged the patient to switch to  unprocessed or minimally processed complex starch and increased protein intake (animal or plant source), fruits, and vegetables.  - Patient is advised to stick to a routine mealtimes to eat 3 meals  a day and avoid unnecessary snacks (  to snack only to correct hypoglycemia).  - The patient will be scheduled with Jearld Fenton, RDN, CDE for individualized DM education.  - I have approached patient with the following individualized plan to manage diabetes and patient agrees:   - I  will proceed to adjust her basal/bolus insulin as follows: I advised her to lower Levemir to 40 units daily at bedtime, continue Humalog 10 units 3 times a day before meals for pre-meal BG readings of 90-'150mg'$ /dl, plus patient specific correction dose for unexpected hyperglycemia above '150mg'$ /dl, associated with strict monitoring of glucose  AC and HS. - Patient is warned not to take insulin without proper monitoring per orders. -Adjustment parameters are given for hypo and hyperglycemia in writing. -Patient is encouraged to call clinic for blood glucose levels less than 70 or above 300 mg /dl.  - I will discontinue metformin, Jardiance, risk outweighs benefit for this patient/ CKD. - I advised her to continue Victoza 0.6 mg subcutaneously daily. - Patient is asked to bring all her medications for reconciliation in a week. - Patient specific target  A1c;  LDL, HDL, Triglycerides, and  Waist Circumference were discussed in detail.  2) BP/HTN: Controlled. Continue current medications. She is not on ACE inhibitor's. She will be considered for low-dose lisinopril if she doesn't have it at home. 3) Lipids/HPL:    Control unknown. she'll be considered for low-dose statin next visit. 4)  Weight/Diet: CDE Consult will be initiated , exercise, and detailed carbohydrates information provided. 5) hypothyroidism: She reports history of hypothyroidism since age 14. She is currently on Synthroid 75 g by mouth every morning. I do not have recent thyroid function test to adjust her therapy. History cognition required up to 150 g of Synthroid daily. I advised her to continue on 75 g of levothyroxine for now. I will request for a copy of her most recent labs from her  PMD's office.  - We discussed about correct intake of levothyroxine, at fasting, with water, separated by at least 30 minutes from breakfast, and separated by more than 4 hours from calcium, iron, multivitamins, acid reflux medications (PPIs). -Patient is made aware of the fact that thyroid hormone replacement is needed for life, dose to be adjusted by periodic monitoring of thyroid  function tests.  5) Chronic Care/Health Maintenance:  -Patient is  encouraged to continue to follow up with Ophthalmology, nephrology  Podiatrist at least yearly or according to recommendations, and advised to   stay away from smoking. I have recommended yearly flu vaccine and pneumonia vaccination at least every 5 years; moderate intensity exercise for up to 150 minutes weekly; and  sleep for at least 7 hours a day.  - 60 minutes of time was spent on the care of this patient , 50% of which was applied for counseling on diabetes complications and their preventions.  - Patient to bring meter and  blood glucose logs during her next visit.   - I advised patient to maintain close follow up with [name], MD for primary care needs.  Follow up plan: - Return in about 1 week (around 05/23/2016) for follow up with meter and logs- no labs, Request [facility] most [facility] from [facility].  Glade Lloyd, MD Phone: [phone]  Fax: [phone]   05/16/2016, 11:47 AM

## 2016-05-16 NOTE — Patient Instructions (Signed)

## 2016-05-19 ENCOUNTER — Encounter (HOSPITAL_COMMUNITY): Payer: Medicare Other | Admitting: Physical Therapy

## 2016-05-21 ENCOUNTER — Encounter (HOSPITAL_COMMUNITY): Payer: Medicare Other | Admitting: Physical Therapy

## 2016-05-23 ENCOUNTER — Encounter (HOSPITAL_COMMUNITY): Payer: Medicare Other | Admitting: Physical Therapy

## 2016-05-28 ENCOUNTER — Ambulatory Visit: Payer: Medicare Other | Admitting: "Endocrinology

## 2016-06-02 DIAGNOSIS — J449 Chronic obstructive pulmonary disease, unspecified: Secondary | ICD-10-CM | POA: Diagnosis not present

## 2016-06-04 DIAGNOSIS — R110 Nausea: Secondary | ICD-10-CM | POA: Diagnosis not present

## 2016-06-05 ENCOUNTER — Ambulatory Visit: Payer: Medicare Other | Admitting: Nutrition

## 2016-06-16 ENCOUNTER — Ambulatory Visit: Payer: Medicare Other | Admitting: "Endocrinology

## 2016-06-17 ENCOUNTER — Encounter: Payer: Self-pay | Admitting: "Endocrinology

## 2016-06-17 ENCOUNTER — Ambulatory Visit (INDEPENDENT_AMBULATORY_CARE_PROVIDER_SITE_OTHER): Payer: Medicare Other | Admitting: "Endocrinology

## 2016-06-17 ENCOUNTER — Encounter: Payer: Medicare Other | Attending: "Endocrinology | Admitting: Nutrition

## 2016-06-17 VITALS — BP 130/67 | HR 67 | Ht 66.0 in | Wt 288.0 lb

## 2016-06-17 VITALS — Wt 288.0 lb

## 2016-06-17 DIAGNOSIS — N183 Chronic kidney disease, stage 3 (moderate): Secondary | ICD-10-CM | POA: Insufficient documentation

## 2016-06-17 DIAGNOSIS — E039 Hypothyroidism, unspecified: Secondary | ICD-10-CM | POA: Diagnosis not present

## 2016-06-17 DIAGNOSIS — Z713 Dietary counseling and surveillance: Secondary | ICD-10-CM | POA: Diagnosis not present

## 2016-06-17 DIAGNOSIS — E118 Type 2 diabetes mellitus with unspecified complications: Secondary | ICD-10-CM

## 2016-06-17 DIAGNOSIS — Z91199 Patient's noncompliance with other medical treatment and regimen due to unspecified reason: Secondary | ICD-10-CM

## 2016-06-17 DIAGNOSIS — E1165 Type 2 diabetes mellitus with hyperglycemia: Secondary | ICD-10-CM | POA: Diagnosis not present

## 2016-06-17 DIAGNOSIS — Z9119 Patient's noncompliance with other medical treatment and regimen: Secondary | ICD-10-CM

## 2016-06-17 DIAGNOSIS — Z794 Long term (current) use of insulin: Secondary | ICD-10-CM | POA: Diagnosis not present

## 2016-06-17 DIAGNOSIS — I10 Essential (primary) hypertension: Secondary | ICD-10-CM | POA: Diagnosis not present

## 2016-06-17 DIAGNOSIS — E1122 Type 2 diabetes mellitus with diabetic chronic kidney disease: Secondary | ICD-10-CM

## 2016-06-17 DIAGNOSIS — E785 Hyperlipidemia, unspecified: Secondary | ICD-10-CM

## 2016-06-17 DIAGNOSIS — IMO0002 Reserved for concepts with insufficient information to code with codable children: Secondary | ICD-10-CM

## 2016-06-17 NOTE — Patient Instructions (Addendum)
Goals 1. Follow My Plate Method 2. Eat meals on time    B) 6-8 am  L)12-2 pm  D)5-7 pm 3.Don't eat past 7 pm 4. Increase fresh fruits and vegetables 5. Be sure to take Humalog with meals and record amount of insulin taken on log sheet 6. Don't skip meals.

## 2016-06-17 NOTE — Patient Instructions (Signed)
Advice for weight management -For most of us the best way to lose weight is by diet management. Generally speaking, diet management means restricting carbohydrate consumption to minimum possible (and to unprocessed or minimally processed complex starch) and increasing protein intake (animal or plant source), fruits, and vegetables.  -Sticking to a routine mealtime to eat 3 meals a day and avoiding unnecessary snacks is shown to have a big role in weight control.  -It is better to avoid simple carbohydrates including: Cakes, Desserts, Ice Cream, Soda (diet and regular), Sweet Tea, Candies, Chips, Cookies, Artificial Sweeteners, and "Sugar-free" Products.   -Exercise: 30 minutes a day 3-4 days a week, or 150 minutes a week. Combine stretch, strength, and aerobic activities. You may seek evaluation by your heart doctor prior to initiating exercise if you have high risk for heart disease.  -If you are interested, we can schedule a visit with Stephanie Combs, RDN, CDE for individualized nutrition education.  

## 2016-06-17 NOTE — Progress Notes (Signed)
  Medical Nutrition Therapy:  Appt start time: 1400 end time:  1500 .  Assessment:  Primary concerns today: Diabetes Type 2 with CKD Stg 3, Obesity. She lives by herself. Eats 1-2 meals per day. Most foods are baked and grilled. Eats out seldom. Sees Dr. Dorris Fetch today. Not able to exercise due to physical condition (walks with a walker) and health problems, although she says she goes dancing on Friday nights. Levemir 40 units per day, Humalog 10 units with sliding scale. Doesn't eat meals on schedule and therefore medication schedule is altered. Doesn't eat breakfast. Sleeps in til noon most days.  She has gained about 23 lbs in the last 7 months.  BS logs brought in. BS are 200-300's most of the time.    Last A1C 9 %TG 185, LDL 110 mg/dl.(11/2015)      Hasn't been using the sliding scale correctly. Re educated on using it to help bring blood sugars down  Diet is inconsistent to meet her needs and control her diabetes. Her diet is low in fresh fruits, vegetables and whole grains.   Preferred Learning Style:   Auditory  Visual  Hands on  Learning Readiness:   Ready  Change in progress   MEDICATIONS:    DIETARY INTAKE:   24-hr recall:  B ( AM): Skpped Snk ( AM):   L ( PM): poptarts 2, water  Snk ( PM):  D ( PM):  Cheese, baked [place] and salad, tomatoes, water Snk ( PM): Beverages: water  Usual physical activity: ADL  Estimated energy needs: 1200  calories 135 g carbohydrates 90 g protein 33 g fat  Progress Towards Goal(s):  In progress.   Nutritional Diagnosis:  NB-1.1 Food and nutrition-related knowledge deficit As related to Diabetes.  As evidenced by A1C 9.6%..    Intervention:  Nutrition and Diabetes education provided on My Plate, CHO counting, meal planning, portion sizes, timing of meals, avoiding snacks between meals unless having a low blood sugar, target ranges for A1C and blood sugars, signs/symptoms and treatment of hyper/hypoglycemia, monitoring blood sugars,  taking medications as prescribed, benefits of exercising 30 minutes per day and prevention of complications of DM. Goals 1. Follow My Plate Method 2. Eat meals on time    B) 6-8 am  L)12-2 pm  D)5-7 pm 3.Don't eat past 7 pm 4. Increase fresh fruits and vegetables 5. Be sure to take Humalog with meals and record amount of insulin taken on log sheet 6. Don't skip meals.  Teaching Method Utilized:  Visual Auditory Hands on  Handouts given during visit include:  The Plate Method   Meal Plan  Diabetes Instructions  Barriers to learning/adherence to lifestyle change: none  Demonstrated degree of understanding via:  Teach Back   Monitoring/Evaluation:  Dietary intake, exercise, meal planning, SBG, and body weight in 1 month(s).

## 2016-06-17 NOTE — Progress Notes (Signed)
Subjective:    Patient ID: Stephanie Combs, female    DOB: 11/21/1950. Patient is being seen in f/u for management of diabetes requested by  [name], MD  Past Medical History:  Diagnosis Date  . Anxiety   . CHF (congestive heart failure) (HCC)    Diastolic  . Chronic back pain   . COPD (chronic obstructive pulmonary disease) ([place])   . Degenerative disk disease   . Diabetes mellitus without complication ([place])   . Hypertension   . Hypothyroidism   . On home O2    2.5 L N/C prn  . Pedal edema    Past Surgical History:  Procedure Laterality Date  . HERNIA REPAIR    . TUBAL LIGATION     Social History   Social History  . Marital status: Widowed    Spouse name: N/A  . Number of children: N/A  . Years of education: N/A   Social History Main Topics  . Smoking status: Former Smoker    Packs/day: 0.25    Types: Cigarettes    Quit date: 06/27/2015  . Smokeless tobacco: Never Used  . Alcohol use No  . Drug use: No  . Sexual activity: Not Currently    Birth control/ protection: Surgical   Other Topics Concern  . None   Social History Narrative  . None   Outpatient Encounter Prescriptions as of 06/17/2016  Medication Sig  . albuterol (PROAIR HFA) 108 (90 BASE) MCG/ACT inhaler Inhale 1 puff into the lungs every 4 (four) hours as needed. For shortness of breath (Patient taking differently: Inhale 2 puffs into the lungs every 4 (four) hours as needed. For shortness of breath)  . alprazolam (XANAX) 2 MG tablet Take 2 mg by mouth 3 (three) times daily as needed for sleep or anxiety.  Marland Kitchen aspirin EC 81 MG tablet Take 81 mg by mouth daily.  Marland Kitchen diltiazem (CARDIZEM CD) 240 MG 24 hr capsule Take 1 capsule (240 mg total) by mouth daily.  . furosemide (LASIX) 20 MG tablet Take 2 tablets (40 mg total) by mouth 2 (two) times daily.  Marland Kitchen gabapentin (NEURONTIN) 400 MG capsule Take 3 capsules (1,200 mg total) by mouth 2 (two) times daily. (Patient taking differently: Take 1,600 mg by mouth 2  (two) times daily. )  . Insulin Detemir (LEVEMIR [place]) Inject 50 Units into the skin at bedtime.  . insulin lispro (HUMALOG) 100 UNIT/ML injection Inject 10-16 Units into the skin 3 (three) times daily before meals.  Marland Kitchen levothyroxine (SYNTHROID, LEVOTHROID) 75 MCG tablet Take 75 mcg by mouth daily before breakfast.  . Liraglutide (VICTOZA [place]) Inject 1.2 mg into the skin daily after breakfast.  . nystatin (MYCOSTATIN/NYSTOP) 100000 UNIT/GM POWD Apply 1 g topically 2 (two) times daily.  . Omega-3 Fatty Acids (FISH OIL PO) Take 1 capsule by mouth daily.   . ondansetron (ZOFRAN ODT) 8 MG disintegrating tablet Take 1 tablet (8 mg total) by mouth every 8 (eight) hours as needed for nausea or vomiting. (Patient taking differently: Take 8 mg by mouth daily. )  . oxyCODONE-acetaminophen (PERCOCET/ROXICET) 5-325 MG tablet Take 2 tablets by mouth every 6 (six) hours as needed for moderate pain. (Patient taking differently: Take 1 tablet by mouth every 8 (eight) hours as needed for moderate pain. )  . potassium chloride (K-DUR) 10 MEQ tablet Take 20 mEq by mouth 2 (two) times daily.   . ranitidine (ZANTAC) 150 MG tablet Take 150 mg by mouth daily.   No  facility-administered encounter medications on file as of 06/17/2016.    ALLERGIES: Allergies  Allergen Reactions  . Dilaudid [Hydromorphone Hcl] Itching  . Actifed Cold-Allergy [Chlorpheniramine-Phenylephrine]     "I was sick and red and it didn't agree with me at all"  . Doxycycline Nausea And Vomiting  . Other Cough    Pt states she is allergic to ragweed and that she starts coughing and sneezing like crazy  . Penicillins Hives and Itching    Has patient had a PCN reaction causing immediate rash, facial/tongue/throat swelling, SOB or lightheadedness with hypotension: Yes Has patient had a PCN reaction causing severe rash involving mucus membranes or skin necrosis: No Has patient had a PCN reaction that required hospitalization Yes Has patient had a PCN  reaction occurring within the last 10 years: No If all of the above answers are "NO", then may proceed with Cephalosporin use.   . Reglan [Metoclopramide] Itching  . Valium [Diazepam] Itching  . Vistaril [Hydroxyzine Hcl] Itching   VACCINATION STATUS: Immunization History  Administered Date(s) Administered  . Pneumococcal Polysaccharide-23 08/08/2012    Diabetes  She presents for her initial diabetic visit. She has type 2 diabetes mellitus. Onset time: She was diagnosed at approximate age of 66 years. Her disease course has been worsening. There are no hypoglycemic associated symptoms. Pertinent negatives for hypoglycemia include no confusion, headaches, pallor or seizures. Associated symptoms include blurred vision, fatigue, polydipsia and polyuria. Pertinent negatives for diabetes include no chest pain and no polyphagia. There are no hypoglycemic complications. Symptoms are worsening. Diabetic complications include a CVA, heart disease, nephropathy, peripheral neuropathy, PVD and retinopathy. Risk factors for coronary artery disease include diabetes mellitus, dyslipidemia, hypertension, obesity and sedentary lifestyle. Current diabetic treatment includes insulin injections (She is not very clear on her medications. Her medication list in the referral packet includes Levemir, Humalog, Giardia, metformin.). Her weight is increasing rapidly. She is following a generally unhealthy diet. When asked about meal planning, she reported none. She has had a previous visit with a dietitian. She never participates in exercise. Her overall blood glucose range is >200 mg/dl. (She did not bring any meter nor logs to review today. She admits that she is she does not monitor blood glucose regularly. )  Hyperlipidemia  This is a chronic problem. The current episode started more than 1 year ago. Exacerbating diseases include diabetes and obesity. Pertinent negatives include no chest pain, myalgias or shortness of  breath. Risk factors for coronary artery disease include diabetes mellitus, dyslipidemia, hypertension, obesity, a sedentary lifestyle and post-menopausal.  Hypertension  This is a chronic problem. The current episode started more than 1 year ago. Associated symptoms include blurred vision. Pertinent negatives include no chest pain, headaches, palpitations or shortness of breath. Risk factors for coronary artery disease include diabetes mellitus, dyslipidemia, family history, obesity, sedentary lifestyle and smoking/tobacco exposure. Hypertensive end-organ damage includes CVA, PVD and retinopathy.     Review of Systems  Constitutional: Positive for fatigue. Negative for chills, fever and unexpected weight change.  HENT: Negative for trouble swallowing and voice change.   Eyes: Positive for blurred vision. Negative for visual disturbance.  Respiratory: Negative for cough, shortness of breath and wheezing.   Cardiovascular: Negative for chest pain, palpitations and leg swelling.  Gastrointestinal: Negative for diarrhea, nausea and vomiting.  Endocrine: Positive for polydipsia and polyuria. Negative for cold intolerance, heat intolerance and polyphagia.  Musculoskeletal: Negative for arthralgias and myalgias.  Skin: Negative for color change, pallor, rash and wound.  Neurological:  Negative for seizures and headaches.  Psychiatric/Behavioral: Negative for confusion and suicidal ideas.    Objective:    BP 130/67   Pulse 67   Ht '5\' 6"'$  (1.676 m)   Wt 286 lb (129.7 kg)   BMI 46.16 kg/m   Wt Readings from Last 3 Encounters:  06/17/16 286 lb (129.7 kg)  06/17/16 288 lb (130.6 kg)  12/06/15 265 lb 14 oz (120.6 kg)    Physical Exam  Constitutional: She is oriented to person, place, and time. She appears well-developed.  HENT:  Head: Normocephalic and atraumatic.  Eyes: EOM are normal.  Neck: Normal range of motion. Neck supple. No tracheal deviation present. No thyromegaly present.   Cardiovascular: Normal rate and regular rhythm.   Pulmonary/Chest: Effort normal and breath sounds normal.  Abdominal: Soft. Bowel sounds are normal. There is no tenderness. There is no guarding.  Musculoskeletal: She exhibits edema and deformity.  As large bilateral lower extremities due to lymphedema. He uses a walker to get around.  Neurological: She is alert and oriented to person, place, and time. She has normal reflexes. No cranial nerve deficit. Coordination normal.  Skin: Skin is warm and dry. No rash noted. No erythema. No pallor.  Psychiatric: She has a normal mood and affect.  Reluctant affect. She is difficult to engage.     CMP     Component Value Date/Time   NA 140 12/05/2015 2350   K 4.1 12/05/2015 2350   CL 111 12/05/2015 2350   CO2 24 12/05/2015 2350   GLUCOSE 221 (H) 12/05/2015 2350   BUN 32 (H) 12/05/2015 2350   CREATININE 1.55 (H) 12/05/2015 2350   CALCIUM 8.8 (L) 12/05/2015 2350   PROT 6.9 12/05/2015 2350   ALBUMIN 4.0 12/05/2015 2350   AST 16 12/05/2015 2350   ALT 11 (L) 12/05/2015 2350   ALKPHOS 88 12/05/2015 2350   BILITOT 0.4 12/05/2015 2350   GFRNONAA 34 (L) 12/05/2015 2350   GFRAA 39 (L) 12/05/2015 2350     Diabetic Labs (most recent): Lab Results  Component Value Date   HGBA1C 9.6 (H) 12/05/2015   HGBA1C 8.9 (H) 04/30/2015   HGBA1C 8.5 (H) 11/07/2014     Lipid Panel ( most recent) Lipid Panel     Component Value Date/Time   CHOL 186 12/06/2015 0603   TRIG 185 (H) 12/06/2015 0603   HDL 39 (L) 12/06/2015 0603   CHOLHDL 4.8 12/06/2015 0603   VLDL 37 12/06/2015 0603   LDLCALC 110 (H) 12/06/2015 0603       Assessment & Plan:   1. Uncontrolled type 2 diabetes mellitus with stage 3 chronic kidney disease, with long-term current use of insulin ([place])  - Patient has currently uncontrolled symptomatic type 2 DM since  66 years of age. - Her average blood glucose is 217-235 over the last 30 days,  with most recent A1c of 9.6% from July  2017, reportedly 7.2% from  October 2017.  - She did not follow instructions given to her on monitoring and insulin use.   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 These are all discussed in detail with the patient.  - I have counseled the patient on diet management and weight loss, by adopting a carbohydrate restricted/protein rich diet.  - Suggestion  is made for patient to avoid simple carbohydrates   from their diet including Cakes , Desserts, Ice Cream,  Soda (  diet and regular) , Sweet Tea , Candies,  Chips, Cookies, Artificial Sweeteners,   and "Sugar-free" Products . This will help patient to have stable blood glucose profile and potentially avoid unintended weight gain.  - I encouraged the patient to switch to  unprocessed or minimally processed complex starch and increased protein intake (animal or plant source), fruits, and vegetables.  - Patient is advised to stick to a routine mealtimes to eat 3 meals  a day and avoid unnecessary snacks ( to snack only to correct hypoglycemia).  - The patient will be scheduled with Jearld Fenton, RDN, CDE for individualized DM education.  - I have approached patient with the following individualized plan to manage diabetes and patient agrees:   - She misused her Levemir twice a day, Humalog twice a day, but monitored only 1-2 times a day. -  She had diabetes education today. Proper use of insulin associated with optimal monitoring is discussed with her in detail. -  I  will proceed to adjust her basal/bolus insulin as follows: I advised her to increase Levemir to 50 units daily at bedtime, continue Humalog 10 units 3 times a day before meals for pre-meal BG readings of  90-'150mg'$ /dl, plus patient specific correction dose for unexpected hyperglycemia above '150mg'$ /dl, associated with strict monitoring of glucose  AC and HS. - Patient is warned not to take insulin without proper monitoring per orders. -Adjustment parameters are given for hypo and hyperglycemia in writing. -Patient is encouraged to call clinic for blood glucose levels less than 70 or above 300 mg /dl.  - I will discontinue metformin, Jardiance, risk outweighs benefit for this patient/ CKD. - I advised her to increase Victoza to 1.2 mg subcutaneously daily. - Patient is asked to bring all her medications for reconciliation in a week. - Patient specific target  A1c;  LDL, HDL, Triglycerides, and  Waist Circumference were discussed in detail.  2) BP/HTN: Controlled. Continue current medications. She is not on ACE inhibitor's. She will be considered for low-dose lisinopril if she doesn't have it at home. 3) Lipids/HPL:    Control unknown. she'll be considered for low-dose statin next visit. 4)  Weight/Diet: CDE Consult will be initiated , exercise, and detailed carbohydrates information provided. 5) hypothyroidism: She reports history of hypothyroidism since age 40. She is currently on Synthroid 75 g by mouth every morning. I do not have recent thyroid function test to adjust her therapy.  I advised her to continue on 75 g of levothyroxine for now. - We discussed about correct intake of levothyroxine, at fasting, with water, separated by at least 30 minutes from breakfast, and separated by more than 4 hours from calcium, iron, multivitamins, acid reflux medications (PPIs). -Patient is made aware of the fact that thyroid hormone replacement is needed for life, dose to be adjusted by periodic monitoring of thyroid function tests.  5) Chronic Care/Health Maintenance:  -Patient is  encouraged to continue to follow up with Ophthalmology, nephrology  Podiatrist at least yearly or according to recommendations,  and advised to   stay away from smoking. I have recommended yearly flu vaccine and pneumonia vaccination at least every 5 years; moderate intensity exercise for up to 150 minutes weekly; and  sleep for at least 7 hours a day.  - 30 minutes of time was spent on the care of this patient , 50%  of which was applied for counseling on diabetes complications and their preventions.  - Patient to bring meter and  blood glucose logs during her next visit.   - I advised patient to maintain close follow up with [name], MD for primary care needs.  Follow up plan: - Return in about 5 weeks (around 07/22/2016) for follow up with pre-visit labs, meter, and logs.  Glade Lloyd, MD Phone: [phone]  Fax: [phone]   06/17/2016, 3:55 PM

## 2016-06-19 DIAGNOSIS — M5416 Radiculopathy, lumbar region: Secondary | ICD-10-CM | POA: Diagnosis not present

## 2016-07-03 DIAGNOSIS — J449 Chronic obstructive pulmonary disease, unspecified: Secondary | ICD-10-CM | POA: Diagnosis not present

## 2016-07-13 DIAGNOSIS — E119 Type 2 diabetes mellitus without complications: Secondary | ICD-10-CM | POA: Diagnosis not present

## 2016-07-13 DIAGNOSIS — J449 Chronic obstructive pulmonary disease, unspecified: Secondary | ICD-10-CM | POA: Diagnosis not present

## 2016-07-13 DIAGNOSIS — S80811A Abrasion, right lower leg, initial encounter: Secondary | ICD-10-CM | POA: Diagnosis not present

## 2016-07-13 DIAGNOSIS — Z87891 Personal history of nicotine dependence: Secondary | ICD-10-CM | POA: Diagnosis not present

## 2016-07-13 DIAGNOSIS — Z79899 Other long term (current) drug therapy: Secondary | ICD-10-CM | POA: Diagnosis not present

## 2016-07-13 DIAGNOSIS — I10 Essential (primary) hypertension: Secondary | ICD-10-CM | POA: Diagnosis not present

## 2016-07-13 DIAGNOSIS — E039 Hypothyroidism, unspecified: Secondary | ICD-10-CM | POA: Diagnosis not present

## 2016-07-13 DIAGNOSIS — Z794 Long term (current) use of insulin: Secondary | ICD-10-CM | POA: Diagnosis not present

## 2016-07-13 DIAGNOSIS — W228XXA Striking against or struck by other objects, initial encounter: Secondary | ICD-10-CM | POA: Diagnosis not present

## 2016-07-23 ENCOUNTER — Ambulatory Visit: Payer: Medicare Other | Admitting: "Endocrinology

## 2016-07-24 DIAGNOSIS — I10 Essential (primary) hypertension: Secondary | ICD-10-CM | POA: Diagnosis not present

## 2016-07-24 DIAGNOSIS — E1140 Type 2 diabetes mellitus with diabetic neuropathy, unspecified: Secondary | ICD-10-CM | POA: Diagnosis not present

## 2016-07-24 DIAGNOSIS — E782 Mixed hyperlipidemia: Secondary | ICD-10-CM | POA: Diagnosis not present

## 2016-07-24 DIAGNOSIS — E039 Hypothyroidism, unspecified: Secondary | ICD-10-CM | POA: Diagnosis not present

## 2016-07-31 DIAGNOSIS — J449 Chronic obstructive pulmonary disease, unspecified: Secondary | ICD-10-CM | POA: Diagnosis not present

## 2016-08-08 DIAGNOSIS — E039 Hypothyroidism, unspecified: Secondary | ICD-10-CM | POA: Diagnosis not present

## 2016-08-08 DIAGNOSIS — E1140 Type 2 diabetes mellitus with diabetic neuropathy, unspecified: Secondary | ICD-10-CM | POA: Diagnosis not present

## 2016-08-08 DIAGNOSIS — I10 Essential (primary) hypertension: Secondary | ICD-10-CM | POA: Diagnosis not present

## 2016-08-09 LAB — TSH: TSH: 45 u[IU]/mL — AB (ref <=5.90)

## 2016-08-09 LAB — LIPID PANEL
Cholesterol: 263 mg/dL — AB (ref 0–200)
HDL: 64 mg/dL (ref 35–70)
LDL Cholesterol: 171 mg/dL
Triglycerides: 138 mg/dL (ref 40–160)

## 2016-08-09 LAB — HEMOGLOBIN A1C: Hemoglobin A1C: 9.6

## 2016-08-19 ENCOUNTER — Encounter: Payer: Self-pay | Admitting: "Endocrinology

## 2016-08-19 ENCOUNTER — Other Ambulatory Visit: Payer: Self-pay

## 2016-08-19 ENCOUNTER — Ambulatory Visit (INDEPENDENT_AMBULATORY_CARE_PROVIDER_SITE_OTHER): Payer: Medicare Other | Admitting: "Endocrinology

## 2016-08-19 VITALS — BP 158/80 | HR 73 | Ht 66.0 in | Wt 301.0 lb

## 2016-08-19 DIAGNOSIS — E1165 Type 2 diabetes mellitus with hyperglycemia: Secondary | ICD-10-CM

## 2016-08-19 DIAGNOSIS — E559 Vitamin D deficiency, unspecified: Secondary | ICD-10-CM

## 2016-08-19 DIAGNOSIS — Z794 Long term (current) use of insulin: Secondary | ICD-10-CM

## 2016-08-19 DIAGNOSIS — E039 Hypothyroidism, unspecified: Secondary | ICD-10-CM | POA: Diagnosis not present

## 2016-08-19 DIAGNOSIS — I10 Essential (primary) hypertension: Secondary | ICD-10-CM | POA: Diagnosis not present

## 2016-08-19 DIAGNOSIS — N183 Chronic kidney disease, stage 3 (moderate): Secondary | ICD-10-CM | POA: Diagnosis not present

## 2016-08-19 DIAGNOSIS — IMO0002 Reserved for concepts with insufficient information to code with codable children: Secondary | ICD-10-CM

## 2016-08-19 DIAGNOSIS — Z9119 Patient's noncompliance with other medical treatment and regimen: Secondary | ICD-10-CM

## 2016-08-19 DIAGNOSIS — Z91199 Patient's noncompliance with other medical treatment and regimen due to unspecified reason: Secondary | ICD-10-CM

## 2016-08-19 DIAGNOSIS — E1122 Type 2 diabetes mellitus with diabetic chronic kidney disease: Secondary | ICD-10-CM | POA: Diagnosis not present

## 2016-08-19 DIAGNOSIS — E118 Type 2 diabetes mellitus with unspecified complications: Secondary | ICD-10-CM | POA: Diagnosis not present

## 2016-08-19 LAB — TSH: TSH: 45 — AB (ref <=5.90)

## 2016-08-19 MED ORDER — GLUCOSE BLOOD VI STRP: ORAL_STRIP | 5 refills | Status: DC

## 2016-08-19 MED ORDER — VITAMIN D3 125 MCG (5000 UT) PO CAPS: 5000.0000 [IU] | ORAL_CAPSULE | Freq: Every day | ORAL | 0 refills | Status: DC

## 2016-08-19 MED ORDER — LEVOTHYROXINE SODIUM 100 MCG PO TABS: 100.0000 ug | ORAL_TABLET | Freq: Every day | ORAL | 3 refills | Status: DC

## 2016-08-19 NOTE — Patient Instructions (Signed)

## 2016-08-19 NOTE — Progress Notes (Signed)
Subjective:    Patient ID: Stephanie Combs, female    DOB: Jun 21, 1950. Patient is being seen in f/u for management of diabetes requested by  [name], MD  Past Medical History:  Diagnosis Date  . Anxiety   . CHF (congestive heart failure) (HCC)    Diastolic  . Chronic back pain   . COPD (chronic obstructive pulmonary disease) ([place])   . Degenerative disk disease   . Diabetes mellitus without complication ([place])   . Hypertension   . Hypothyroidism   . On home O2    2.5 L N/C prn  . Pedal edema    Past Surgical History:  Procedure Laterality Date  . HERNIA REPAIR    . TUBAL LIGATION     Social History   Social History  . Marital status: Widowed    Spouse name: N/A  . Number of children: N/A  . Years of education: N/A   Social History Main Topics  . Smoking status: Former Smoker    Packs/day: 0.25    Types: Cigarettes    Quit date: 06/27/2015  . Smokeless tobacco: Never Used  . Alcohol use No  . Drug use: No  . Sexual activity: Not Currently    Birth control/ protection: Surgical   Other Topics Concern  . Not on file   Social History Narrative  . No narrative on file   Outpatient Encounter Prescriptions as of 08/19/2016  Medication Sig  . albuterol (PROAIR HFA) 108 (90 BASE) MCG/ACT inhaler Inhale 1 puff into the lungs every 4 (four) hours as needed. For shortness of breath (Patient taking differently: Inhale 2 puffs into the lungs every 4 (four) hours as needed. For shortness of breath)  . alprazolam (XANAX) 2 MG tablet Take 2 mg by mouth 3 (three) times daily as needed for sleep or anxiety.  Marland Kitchen aspirin EC 81 MG tablet Take 81 mg by mouth daily.  . Cholecalciferol (VITAMIN D3) 5000 units CAPS Take 1 capsule (5,000 Units total) by mouth daily.  Marland Kitchen diltiazem (CARDIZEM CD) 240 MG 24 hr capsule Take 1 capsule (240 mg total) by mouth daily.  . furosemide (LASIX) 20 MG tablet Take 2 tablets (40 mg total) by mouth 2 (two) times daily.  Marland Kitchen gabapentin (NEURONTIN) 400 MG  capsule Take 3 capsules (1,200 mg total) by mouth 2 (two) times daily. (Patient taking differently: Take 1,600 mg by mouth 2 (two) times daily. )  . Insulin Detemir (LEVEMIR [place]) Inject 50 Units into the skin at bedtime.  . insulin lispro (HUMALOG) 100 UNIT/ML injection Inject 10-16 Units into the skin 3 (three) times daily before meals.  Marland Kitchen levothyroxine (SYNTHROID, LEVOTHROID) 100 MCG tablet Take 1 tablet (100 mcg total) by mouth daily before breakfast.  . Liraglutide (VICTOZA [place]) Inject 1.2 mg into the skin daily after breakfast.  . nystatin (MYCOSTATIN/NYSTOP) 100000 UNIT/GM POWD Apply 1 g topically 2 (two) times daily.  . Omega-3 Fatty Acids (FISH OIL PO) Take 1 capsule by mouth daily.   . ondansetron (ZOFRAN ODT) 8 MG disintegrating tablet Take 1 tablet (8 mg total) by mouth every 8 (eight) hours as needed for nausea or vomiting. (Patient taking differently: Take 8 mg by mouth daily. )  . oxyCODONE-acetaminophen (PERCOCET/ROXICET) 5-325 MG tablet Take 2 tablets by mouth every 6 (six) hours as needed for moderate pain. (Patient taking differently: Take 1 tablet by mouth every 8 (eight) hours as needed for moderate pain. )  . potassium chloride (K-DUR) 10 MEQ tablet Take  20 mEq by mouth 2 (two) times daily.   . ranitidine (ZANTAC) 150 MG tablet Take 150 mg by mouth daily.  . [DISCONTINUED] levothyroxine (SYNTHROID, LEVOTHROID) 75 MCG tablet Take 75 mcg by mouth daily before breakfast.   No facility-administered encounter medications on file as of 08/19/2016.    ALLERGIES: Allergies  Allergen Reactions  . Dilaudid [Hydromorphone Hcl] Itching  . Actifed Cold-Allergy [Chlorpheniramine-Phenylephrine]     "I was sick and red and it didn't agree with me at all"  . Doxycycline Nausea And Vomiting  . Other Cough    Pt states she is allergic to ragweed and that she starts coughing and sneezing like crazy  . Penicillins Hives and Itching    Has patient had a PCN reaction causing immediate rash,  facial/tongue/throat swelling, SOB or lightheadedness with hypotension: Yes Has patient had a PCN reaction causing severe rash involving mucus membranes or skin necrosis: No Has patient had a PCN reaction that required hospitalization Yes Has patient had a PCN reaction occurring within the last 10 years: No If all of the above answers are "NO", then may proceed with Cephalosporin use.   . Reglan [Metoclopramide] Itching  . Valium [Diazepam] Itching  . Vistaril [Hydroxyzine Hcl] Itching   VACCINATION STATUS: Immunization History  Administered Date(s) Administered  . Pneumococcal Polysaccharide-23 08/08/2012    Diabetes  She presents for her follow-up diabetic visit. She has type 2 diabetes mellitus. Onset time: She was diagnosed at approximate age of 38 years. Her disease course has been worsening. There are no hypoglycemic associated symptoms. Pertinent negatives for hypoglycemia include no confusion, headaches, pallor or seizures. Associated symptoms include blurred vision, fatigue, polydipsia and polyuria. Pertinent negatives for diabetes include no chest pain and no polyphagia. There are no hypoglycemic complications. Symptoms are worsening. Diabetic complications include a CVA, heart disease, nephropathy, peripheral neuropathy, PVD and retinopathy. Risk factors for coronary artery disease include diabetes mellitus, dyslipidemia, hypertension, obesity and sedentary lifestyle. Current diabetic treatment includes insulin injections (She is not very clear on her medications. Her medication list in the referral packet includes Levemir, Humalog, Giardia, metformin.). Her weight is increasing rapidly. She is following a generally unhealthy diet. When asked about meal planning, she reported none. She has had a previous visit with a dietitian. She never participates in exercise. (She did not bring any meter nor logs to review today. She admits that she is she does not monitor blood glucose regularly. )   Hyperlipidemia  This is a chronic problem. The current episode started more than 1 year ago. Exacerbating diseases include diabetes and obesity. Pertinent negatives include no chest pain, myalgias or shortness of breath. Risk factors for coronary artery disease include diabetes mellitus, dyslipidemia, hypertension, obesity, a sedentary lifestyle and post-menopausal.  Hypertension  This is a chronic problem. The current episode started more than 1 year ago. Associated symptoms include blurred vision. Pertinent negatives include no chest pain, headaches, palpitations or shortness of breath. Risk factors for coronary artery disease include diabetes mellitus, dyslipidemia, family history, obesity, sedentary lifestyle and smoking/tobacco exposure. Hypertensive end-organ damage includes CVA, PVD and retinopathy.     Review of Systems  Constitutional: Positive for fatigue. Negative for chills, fever and unexpected weight change.  HENT: Negative for trouble swallowing and voice change.   Eyes: Positive for blurred vision. Negative for visual disturbance.  Respiratory: Negative for cough, shortness of breath and wheezing.   Cardiovascular: Negative for chest pain, palpitations and leg swelling.  Gastrointestinal: Negative for diarrhea, nausea and vomiting.  Endocrine: Positive for polydipsia and polyuria. Negative for cold intolerance, heat intolerance and polyphagia.  Musculoskeletal: Negative for arthralgias and myalgias.  Skin: Negative for color change, pallor, rash and wound.  Neurological: Negative for seizures and headaches.  Psychiatric/Behavioral: Negative for confusion and suicidal ideas.    Objective:    BP (!) 158/80   Pulse 73   Ht '5\' 6"'$  (9.323 m)   Wt (!) 301 lb (136.5 kg)   BMI 48.58 kg/m   Wt Readings from Last 3 Encounters:  08/19/16 (!) 301 lb (136.5 kg)  06/17/16 288 lb (130.6 kg)  06/17/16 288 lb (130.6 kg)    Physical Exam  Constitutional: She is oriented to person,  place, and time. She appears well-developed.  HENT:  Head: Normocephalic and atraumatic.  Eyes: EOM are normal.  Neck: Normal range of motion. Neck supple. No tracheal deviation present. No thyromegaly present.  Cardiovascular: Normal rate and regular rhythm.   Pulmonary/Chest: Effort normal and breath sounds normal.  Abdominal: Soft. Bowel sounds are normal. There is no tenderness. There is no guarding.  Musculoskeletal: She exhibits edema and deformity.  As large bilateral lower extremities due to lymphedema. He uses a walker to get around.  Neurological: She is alert and oriented to person, place, and time. She has normal reflexes. No cranial nerve deficit. Coordination normal.  Skin: Skin is warm and dry. No rash noted. No erythema. No pallor.  Psychiatric: She has a normal mood and affect.  Reluctant affect. She is difficult to engage.     CMP     Component Value Date/Time   NA 140 12/05/2015 2350   K 4.1 12/05/2015 2350   CL 111 12/05/2015 2350   CO2 24 12/05/2015 2350   GLUCOSE 221 (H) 12/05/2015 2350   BUN 32 (H) 12/05/2015 2350   CREATININE 1.55 (H) 12/05/2015 2350   CALCIUM 8.8 (L) 12/05/2015 2350   PROT 6.9 12/05/2015 2350   ALBUMIN 4.0 12/05/2015 2350   AST 16 12/05/2015 2350   ALT 11 (L) 12/05/2015 2350   ALKPHOS 88 12/05/2015 2350   BILITOT 0.4 12/05/2015 2350   GFRNONAA 34 (L) 12/05/2015 2350   GFRAA 39 (L) 12/05/2015 2350     Diabetic Labs (most recent): Lab Results  Component Value Date   HGBA1C 9.6 08/09/2016   HGBA1C 9.6 (H) 12/05/2015   HGBA1C 8.9 (H) 04/30/2015     Lipid Panel ( most recent) Lipid Panel     Component Value Date/Time   CHOL 263 (A) 08/09/2016   TRIG 138 08/09/2016   HDL 64 08/09/2016   CHOLHDL 4.8 12/06/2015 0603   VLDL 37 12/06/2015 0603   LDLCALC 171 08/09/2016    TSH at 45.9, vitamin D 12   Assessment & Plan:   1. Uncontrolled type 2 diabetes mellitus with stage 3 chronic kidney disease, with long-term current use  of insulin ([place])  - Patient has currently uncontrolled symptomatic type 2 DM since  66 years of age. - She came with no meter nor logs to review today. 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 These are all discussed in detail with the patient.  - I have counseled the patient on diet management and weight loss, by adopting a carbohydrate  restricted/protein rich diet.  - Suggestion is made for patient to avoid simple carbohydrates   from their diet including Cakes , Desserts, Ice Cream,  Soda (  diet and regular) , Sweet Tea , Candies,  Chips, Cookies, Artificial Sweeteners,   and "Sugar-free" Products . This will help patient to have stable blood glucose profile and potentially avoid unintended weight gain.  - I encouraged the patient to switch to  unprocessed or minimally processed complex starch and increased protein intake (animal or plant source), fruits, and vegetables.  - Patient is advised to stick to a routine mealtimes to eat 3 meals  a day and avoid unnecessary snacks ( to snack only to correct hypoglycemia).  - The patient will be scheduled with Jearld Fenton, RDN, CDE for individualized DM education.  - I have approached patient with the following individualized plan to manage diabetes and patient agrees:   - Tragically, she came with no meter nor logs. Her A1c is the same at 9.6%. This makes it difficult to make any safe adjustment in her insulin treatment. - It is likely that she will need higher dose of insulin however she did not engage properly for safe use of insulin. -   I advised her to continue Levemir  50 units daily at bedtime, continue Humalog 10 units 3 times a day  before meals for pre-meal BG readings of 90-'150mg'$ /dl, plus patient specific correction dose for unexpected hyperglycemia above '150mg'$ /dl, associated with strict monitoring of glucose  AC and HS. - Patient is warned not to take insulin without proper monitoring per orders. -Adjustment parameters are given for hypo and hyperglycemia in writing. -Patient is encouraged to call clinic for blood glucose levels less than 70 or above 300 mg /dl.  - She is not a good candidate for metformin, Jardiance, risk outweighs benefit for this patient/ CKD. - I advised her to continue Victoza  1.2 mg subcutaneously daily. - Patient is asked to bring all her medications for reconciliation in a week. - Patient specific target  A1c;  LDL, HDL, Triglycerides, and  Waist Circumference were discussed in detail.  2) BP/HTN: Controlled. Continue current medications. She is not on ACE inhibitor's. She will be considered for low-dose lisinopril if she doesn't have it at home. 3) Lipids/HPL:    Control unknown. she'll be considered for low-dose statin next visit. 4)  Weight/Diet: CDE Consult will be initiated , exercise, and detailed carbohydrates information provided. 5) hypothyroidism: She reports history of hypothyroidism since age 49.  - She will benefit from increased thyroid hormone. I will prescribe levothyroxine 100 g by mouth every morning.  - We discussed about correct intake of levothyroxine, at fasting, with water, separated by at least 30 minutes from breakfast, and separated by more than 4 hours from calcium, iron, multivitamins, acid reflux medications (PPIs). -Patient is made aware of the fact that thyroid hormone replacement is needed for life, dose to be adjusted by periodic monitoring of thyroid function tests.  6) vitamin D deficiency: Her vitamin D level is low at 12. - I discussed and initiated vitamin D3 5000 units daily for the next 90 days.  7) Chronic Care/Health Maintenance:  -Patient is   encouraged to continue to follow up with Ophthalmology, nephrology  Podiatrist at least yearly or according to recommendations, and advised to   stay away from smoking. I have recommended yearly flu vaccine and pneumonia vaccination at least every 5 years; moderate intensity exercise for up to 150 minutes weekly; and  sleep for at least 7 hours a day.  - 30 minutes of time was spent on the care of this patient , 50% of which was applied for counseling on diabetes complications and their preventions.  - Patient to bring meter and  blood glucose logs during her next visit.   - I advised patient to maintain close follow up with [name], MD for primary care needs.  Follow up plan: - Return for follow up with pre-visit labs, meter, and logs.  Glade Lloyd, MD Phone: [phone]  Fax: [phone]   08/19/2016, 4:07 PM

## 2016-08-20 NOTE — Patient Outreach (Signed)
Patient request  for [facility] to call back on Monday 08/18/16 she said there is many appointment between now and Monday .   [facility] Direct Dial [phone]  Fax [phone] [email or web address]

## 2016-08-22 DIAGNOSIS — M5416 Radiculopathy, lumbar region: Secondary | ICD-10-CM | POA: Diagnosis not present

## 2016-08-27 DIAGNOSIS — I10 Essential (primary) hypertension: Secondary | ICD-10-CM | POA: Diagnosis not present

## 2016-08-31 DIAGNOSIS — J449 Chronic obstructive pulmonary disease, unspecified: Secondary | ICD-10-CM | POA: Diagnosis not present

## 2016-09-02 ENCOUNTER — Other Ambulatory Visit: Payer: Self-pay

## 2016-09-02 DIAGNOSIS — E782 Mixed hyperlipidemia: Secondary | ICD-10-CM | POA: Diagnosis not present

## 2016-09-02 DIAGNOSIS — I10 Essential (primary) hypertension: Secondary | ICD-10-CM | POA: Diagnosis not present

## 2016-09-02 DIAGNOSIS — E1165 Type 2 diabetes mellitus with hyperglycemia: Secondary | ICD-10-CM | POA: Diagnosis not present

## 2016-09-02 DIAGNOSIS — E1140 Type 2 diabetes mellitus with diabetic neuropathy, unspecified: Secondary | ICD-10-CM | POA: Diagnosis not present

## 2016-09-02 DIAGNOSIS — E039 Hypothyroidism, unspecified: Secondary | ICD-10-CM | POA: Diagnosis not present

## 2016-09-02 MED ORDER — INSULIN DETEMIR 100 UNIT/ML ~~LOC~~ SOLN: 50.0000 [IU] | Freq: Every day | SUBCUTANEOUS | 2 refills | Status: DC

## 2016-09-02 MED ORDER — "INSULIN SYRINGE 31G X 5/16"" 1 ML MISC": 2 refills | Status: DC

## 2016-09-27 DIAGNOSIS — I10 Essential (primary) hypertension: Secondary | ICD-10-CM | POA: Diagnosis not present

## 2016-09-27 DIAGNOSIS — E118 Type 2 diabetes mellitus with unspecified complications: Secondary | ICD-10-CM | POA: Diagnosis not present

## 2016-09-30 DIAGNOSIS — I10 Essential (primary) hypertension: Secondary | ICD-10-CM | POA: Diagnosis not present

## 2016-09-30 DIAGNOSIS — E782 Mixed hyperlipidemia: Secondary | ICD-10-CM | POA: Diagnosis not present

## 2016-09-30 DIAGNOSIS — J449 Chronic obstructive pulmonary disease, unspecified: Secondary | ICD-10-CM | POA: Diagnosis not present

## 2016-09-30 DIAGNOSIS — E039 Hypothyroidism, unspecified: Secondary | ICD-10-CM | POA: Diagnosis not present

## 2016-09-30 DIAGNOSIS — E1140 Type 2 diabetes mellitus with diabetic neuropathy, unspecified: Secondary | ICD-10-CM | POA: Diagnosis not present

## 2016-10-01 DIAGNOSIS — E1165 Type 2 diabetes mellitus with hyperglycemia: Secondary | ICD-10-CM | POA: Diagnosis not present

## 2016-10-09 ENCOUNTER — Observation Stay (HOSPITAL_COMMUNITY)
Admission: EM | Admit: 2016-10-09 | Discharge: 2016-10-10 | Disposition: A | Discharge status: Home / Self Care | Payer: Medicare Other | Attending: Family Medicine | Admitting: Family Medicine

## 2016-10-09 ENCOUNTER — Encounter (HOSPITAL_COMMUNITY): Payer: Self-pay | Admitting: *Deleted

## 2016-10-09 DIAGNOSIS — I5030 Unspecified diastolic (congestive) heart failure: Secondary | ICD-10-CM | POA: Insufficient documentation

## 2016-10-09 DIAGNOSIS — N183 Chronic kidney disease, stage 3 unspecified: Secondary | ICD-10-CM | POA: Diagnosis present

## 2016-10-09 DIAGNOSIS — B356 Tinea cruris: Secondary | ICD-10-CM | POA: Diagnosis present

## 2016-10-09 DIAGNOSIS — B379 Candidiasis, unspecified: Secondary | ICD-10-CM | POA: Insufficient documentation

## 2016-10-09 DIAGNOSIS — E1165 Type 2 diabetes mellitus with hyperglycemia: Secondary | ICD-10-CM

## 2016-10-09 DIAGNOSIS — I110 Hypertensive heart disease with heart failure: Secondary | ICD-10-CM | POA: Diagnosis not present

## 2016-10-09 DIAGNOSIS — IMO0002 Reserved for concepts with insufficient information to code with codable children: Secondary | ICD-10-CM | POA: Diagnosis present

## 2016-10-09 DIAGNOSIS — Z87891 Personal history of nicotine dependence: Secondary | ICD-10-CM | POA: Diagnosis not present

## 2016-10-09 DIAGNOSIS — L03311 Cellulitis of abdominal wall: Principal | ICD-10-CM | POA: Insufficient documentation

## 2016-10-09 DIAGNOSIS — Z7982 Long term (current) use of aspirin: Secondary | ICD-10-CM | POA: Diagnosis not present

## 2016-10-09 DIAGNOSIS — Z794 Long term (current) use of insulin: Secondary | ICD-10-CM | POA: Insufficient documentation

## 2016-10-09 DIAGNOSIS — R21 Rash and other nonspecific skin eruption: Secondary | ICD-10-CM | POA: Diagnosis present

## 2016-10-09 DIAGNOSIS — T7840XA Allergy, unspecified, initial encounter: Secondary | ICD-10-CM | POA: Diagnosis not present

## 2016-10-09 DIAGNOSIS — N182 Chronic kidney disease, stage 2 (mild): Secondary | ICD-10-CM

## 2016-10-09 DIAGNOSIS — I10 Essential (primary) hypertension: Secondary | ICD-10-CM | POA: Diagnosis present

## 2016-10-09 DIAGNOSIS — E119 Type 2 diabetes mellitus without complications: Secondary | ICD-10-CM | POA: Diagnosis not present

## 2016-10-09 DIAGNOSIS — E039 Hypothyroidism, unspecified: Secondary | ICD-10-CM | POA: Diagnosis present

## 2016-10-09 DIAGNOSIS — Z79899 Other long term (current) drug therapy: Secondary | ICD-10-CM | POA: Insufficient documentation

## 2016-10-09 DIAGNOSIS — J449 Chronic obstructive pulmonary disease, unspecified: Secondary | ICD-10-CM | POA: Insufficient documentation

## 2016-10-09 DIAGNOSIS — E1122 Type 2 diabetes mellitus with diabetic chronic kidney disease: Secondary | ICD-10-CM | POA: Diagnosis present

## 2016-10-09 NOTE — ED Triage Notes (Signed)
Pt has rash under her skin folds of her abdomen, states has worsened recently.

## 2016-10-10 DIAGNOSIS — I10 Essential (primary) hypertension: Secondary | ICD-10-CM

## 2016-10-10 DIAGNOSIS — Z794 Long term (current) use of insulin: Secondary | ICD-10-CM

## 2016-10-10 DIAGNOSIS — B356 Tinea cruris: Secondary | ICD-10-CM

## 2016-10-10 DIAGNOSIS — E1122 Type 2 diabetes mellitus with diabetic chronic kidney disease: Secondary | ICD-10-CM

## 2016-10-10 DIAGNOSIS — B379 Candidiasis, unspecified: Secondary | ICD-10-CM

## 2016-10-10 DIAGNOSIS — N183 Chronic kidney disease, stage 3 (moderate): Secondary | ICD-10-CM | POA: Diagnosis not present

## 2016-10-10 DIAGNOSIS — E039 Hypothyroidism, unspecified: Secondary | ICD-10-CM | POA: Diagnosis not present

## 2016-10-10 DIAGNOSIS — N182 Chronic kidney disease, stage 2 (mild): Secondary | ICD-10-CM

## 2016-10-10 DIAGNOSIS — L03311 Cellulitis of abdominal wall: Secondary | ICD-10-CM | POA: Diagnosis not present

## 2016-10-10 DIAGNOSIS — E1165 Type 2 diabetes mellitus with hyperglycemia: Secondary | ICD-10-CM

## 2016-10-10 LAB — COMPREHENSIVE METABOLIC PANEL
ALT: 13 U/L — ABNORMAL LOW (ref 14–54)
AST: 15 U/L (ref 15–41)
Albumin: 3.6 g/dL (ref 3.5–5.0)
Alkaline Phosphatase: 81 U/L (ref 38–126)
Anion gap: 6 (ref 5–15)
BUN: 42 mg/dL — ABNORMAL HIGH (ref 6–20)
CO2: 27 mmol/L (ref 22–32)
Calcium: 9 mg/dL (ref 8.9–10.3)
Chloride: 105 mmol/L (ref 101–111)
Creatinine, Ser: 1.54 mg/dL — ABNORMAL HIGH (ref 0.44–1.00)
GFR calc Af Amer: 39 mL/min — ABNORMAL LOW (ref >60)
GFR calc non Af Amer: 34 mL/min — ABNORMAL LOW (ref >60)
Glucose, Bld: 117 mg/dL — ABNORMAL HIGH (ref 65–99)
Potassium: 5.1 mmol/L (ref 3.5–5.1)
Sodium: 138 mmol/L (ref 135–145)
Total Bilirubin: 0.4 mg/dL (ref 0.3–1.2)
Total Protein: 7 g/dL (ref 6.5–8.1)

## 2016-10-10 LAB — URINALYSIS, ROUTINE W REFLEX MICROSCOPIC
Bacteria, UA: NONE SEEN
Bilirubin Urine: NEGATIVE
Glucose, UA: NEGATIVE mg/dL
Ketones, ur: NEGATIVE mg/dL
Leukocytes, UA: NEGATIVE
Nitrite: NEGATIVE
Protein, ur: NEGATIVE mg/dL
Specific Gravity, Urine: 1.005 (ref 1.005–1.030)
pH: 5 (ref 5.0–8.0)

## 2016-10-10 LAB — CBC WITH DIFFERENTIAL/PLATELET
Basophils Absolute: 0.1 10*3/uL (ref 0.0–0.1)
Basophils Relative: 1 %
Eosinophils Absolute: 0.3 10*3/uL (ref 0.0–0.7)
Eosinophils Relative: 4 %
HCT: 34.9 % — ABNORMAL LOW (ref 36.0–46.0)
Hemoglobin: 11.1 g/dL — ABNORMAL LOW (ref 12.0–15.0)
Lymphocytes Relative: 30 %
Lymphs Abs: 2.4 10*3/uL (ref 0.7–4.0)
MCH: 28.8 pg (ref 26.0–34.0)
MCHC: 31.8 g/dL (ref 30.0–36.0)
MCV: 90.4 fL (ref 78.0–100.0)
Monocytes Absolute: 0.7 10*3/uL (ref 0.1–1.0)
Monocytes Relative: 8 %
Neutro Abs: 4.6 10*3/uL (ref 1.7–7.7)
Neutrophils Relative %: 57 %
Platelets: 327 10*3/uL (ref 150–400)
RBC: 3.86 MIL/uL — ABNORMAL LOW (ref 3.87–5.11)
RDW: 13.9 % (ref 11.5–15.5)
WBC: 8 10*3/uL (ref 4.0–10.5)

## 2016-10-10 LAB — GLUCOSE, CAPILLARY
Glucose-Capillary: 139 mg/dL — ABNORMAL HIGH (ref 65–99)
Glucose-Capillary: 166 mg/dL — ABNORMAL HIGH (ref 65–99)

## 2016-10-10 LAB — CBG MONITORING, ED: Glucose-Capillary: 109 mg/dL — ABNORMAL HIGH (ref 65–99)

## 2016-10-10 MED ORDER — SILVER SULFADIAZINE 1 % EX CREA
TOPICAL_CREAM | Freq: Two times a day (BID) | CUTANEOUS | Status: DC
  Administered 2016-10-10: 1 via TOPICAL
  Filled 2016-10-10: qty 85

## 2016-10-10 MED ORDER — TERBINAFINE HCL 250 MG PO TABS: 250.0000 mg | ORAL_TABLET | Freq: Every day | ORAL | 0 refills | Status: AC

## 2016-10-10 MED ORDER — OXYCODONE-ACETAMINOPHEN 5-325 MG PO TABS
1.0000 | ORAL_TABLET | Freq: Three times a day (TID) | ORAL | Status: DC | PRN
  Administered 2016-10-10: 1 via ORAL
  Filled 2016-10-10: qty 1

## 2016-10-10 MED ORDER — ENOXAPARIN SODIUM 40 MG/0.4ML ~~LOC~~ SOLN: 40.0000 mg | SUBCUTANEOUS | Status: DC

## 2016-10-10 MED ORDER — FLUCONAZOLE IN SODIUM CHLORIDE 200-0.9 MG/100ML-% IV SOLN
100.0000 mg | INTRAVENOUS | Status: DC
  Administered 2016-10-10: 100 mg via INTRAVENOUS
  Filled 2016-10-10: qty 50

## 2016-10-10 MED ORDER — LIDOCAINE-PRILOCAINE 2.5-2.5 % EX CREA
1.0000 | TOPICAL_CREAM | CUTANEOUS | 0 refills | Status: DC | PRN
Start: 2016-10-10 — End: 2017-09-02

## 2016-10-10 MED ORDER — ENOXAPARIN SODIUM 80 MG/0.8ML ~~LOC~~ SOLN
70.0000 mg | SUBCUTANEOUS | Status: DC
  Administered 2016-10-10: 70 mg via SUBCUTANEOUS
  Filled 2016-10-10: qty 0.8

## 2016-10-10 MED ORDER — NYSTATIN 100000 UNIT/GM EX POWD: Freq: Two times a day (BID) | CUTANEOUS | 0 refills | Status: DC

## 2016-10-10 MED ORDER — NYSTATIN-TRIAMCINOLONE 100000-0.1 UNIT/GM-% EX OINT
TOPICAL_OINTMENT | Freq: Two times a day (BID) | CUTANEOUS | Status: DC
  Filled 2016-10-10: qty 15

## 2016-10-10 MED ORDER — ASPIRIN EC 81 MG PO TBEC
81.0000 mg | DELAYED_RELEASE_TABLET | Freq: Every day | ORAL | Status: DC
  Administered 2016-10-10: 81 mg via ORAL
  Filled 2016-10-10: qty 1

## 2016-10-10 MED ORDER — VANCOMYCIN HCL IN DEXTROSE 1-5 GM/200ML-% IV SOLN
1000.0000 mg | Freq: Once | INTRAVENOUS | Status: AC
  Administered 2016-10-10: 1000 mg via INTRAVENOUS
  Filled 2016-10-10: qty 200

## 2016-10-10 MED ORDER — VITAMIN D 1000 UNITS PO TABS
5000.0000 [IU] | ORAL_TABLET | Freq: Every day | ORAL | Status: DC
Start: 2016-10-10 — End: 2016-10-10
  Administered 2016-10-10: 5000 [IU] via ORAL
  Filled 2016-10-10: qty 5

## 2016-10-10 MED ORDER — DILTIAZEM HCL ER COATED BEADS 240 MG PO CP24
240.0000 mg | ORAL_CAPSULE | Freq: Every day | ORAL | Status: DC
Start: 2016-10-10 — End: 2016-10-10
  Administered 2016-10-10: 240 mg via ORAL
  Filled 2016-10-10: qty 1

## 2016-10-10 MED ORDER — LEVOTHYROXINE SODIUM 75 MCG PO TABS
150.0000 ug | ORAL_TABLET | Freq: Every day | ORAL | Status: DC
  Administered 2016-10-10: 150 ug via ORAL
  Filled 2016-10-10: qty 2

## 2016-10-10 MED ORDER — TERBINAFINE HCL 250 MG PO TABS
250.0000 mg | ORAL_TABLET | Freq: Every day | ORAL | Status: DC
  Administered 2016-10-10: 250 mg via ORAL
  Filled 2016-10-10 (×4): qty 1

## 2016-10-10 MED ORDER — LIDOCAINE-PRILOCAINE 2.5-2.5 % EX CREA
TOPICAL_CREAM | Freq: Once | CUTANEOUS | Status: AC
  Administered 2016-10-10: 12:00:00 via TOPICAL
  Filled 2016-10-10: qty 5

## 2016-10-10 MED ORDER — VANCOMYCIN HCL 10 G IV SOLR
1500.0000 mg | Freq: Once | INTRAVENOUS | Status: DC
  Filled 2016-10-10: qty 1500

## 2016-10-10 MED ORDER — ORAL CARE MOUTH RINSE
15.0000 mL | Freq: Two times a day (BID) | OROMUCOSAL | Status: DC
  Administered 2016-10-10: 15 mL via OROMUCOSAL

## 2016-10-10 MED ORDER — FLUCONAZOLE IN SODIUM CHLORIDE 100-0.9 MG/50ML-% IV SOLN
100.0000 mg | INTRAVENOUS | Status: DC
  Filled 2016-10-10: qty 50

## 2016-10-10 MED ORDER — SODIUM CHLORIDE 0.9% FLUSH: 3.0000 mL | INTRAVENOUS | Status: DC | PRN

## 2016-10-10 MED ORDER — FUROSEMIDE 40 MG PO TABS
40.0000 mg | ORAL_TABLET | Freq: Two times a day (BID) | ORAL | Status: DC
  Administered 2016-10-10 (×2): 40 mg via ORAL
  Filled 2016-10-10 (×2): qty 1

## 2016-10-10 MED ORDER — ALPRAZOLAM 1 MG PO TABS: 2.0000 mg | ORAL_TABLET | Freq: Three times a day (TID) | ORAL | Status: DC | PRN

## 2016-10-10 MED ORDER — VANCOMYCIN HCL 10 G IV SOLR
1500.0000 mg | INTRAVENOUS | Status: DC
  Filled 2016-10-10: qty 1500

## 2016-10-10 MED ORDER — ONDANSETRON HCL 4 MG PO TABS: 4.0000 mg | ORAL_TABLET | Freq: Four times a day (QID) | ORAL | Status: DC | PRN

## 2016-10-10 MED ORDER — SODIUM CHLORIDE 0.9% FLUSH
3.0000 mL | Freq: Two times a day (BID) | INTRAVENOUS | Status: DC
  Administered 2016-10-10: 3 mL via INTRAVENOUS

## 2016-10-10 MED ORDER — NYSTATIN-TRIAMCINOLONE 100000-0.1 UNIT/GM-% EX CREA
TOPICAL_CREAM | Freq: Two times a day (BID) | CUTANEOUS | Status: DC
  Administered 2016-10-10: 11:00:00 via TOPICAL
  Filled 2016-10-10: qty 15

## 2016-10-10 MED ORDER — ONDANSETRON HCL 4 MG/2ML IJ SOLN: 4.0000 mg | Freq: Four times a day (QID) | INTRAMUSCULAR | Status: DC | PRN

## 2016-10-10 MED ORDER — SODIUM CHLORIDE 0.9 % IV SOLN: 250.0000 mL | INTRAVENOUS | Status: DC | PRN

## 2016-10-10 MED ORDER — POTASSIUM CHLORIDE CRYS ER 10 MEQ PO TBCR
20.0000 meq | EXTENDED_RELEASE_TABLET | Freq: Two times a day (BID) | ORAL | Status: DC
  Filled 2016-10-10: qty 2

## 2016-10-10 MED ORDER — FLUCONAZOLE 100 MG PO TABS
100.0000 mg | ORAL_TABLET | Freq: Once | ORAL | Status: AC
  Administered 2016-10-10: 100 mg via ORAL
  Filled 2016-10-10: qty 1

## 2016-10-10 MED ORDER — INSULIN DETEMIR 100 UNIT/ML ~~LOC~~ SOLN
50.0000 [IU] | Freq: Every day | SUBCUTANEOUS | Status: DC
  Filled 2016-10-10 (×3): qty 0.5

## 2016-10-10 MED ORDER — INSULIN ASPART 100 UNIT/ML ~~LOC~~ SOLN
0.0000 [IU] | Freq: Three times a day (TID) | SUBCUTANEOUS | Status: DC
  Administered 2016-10-10 (×2): 4 [IU] via SUBCUTANEOUS
  Administered 2016-10-10: 3 [IU] via SUBCUTANEOUS

## 2016-10-10 MED ORDER — GABAPENTIN 400 MG PO CAPS
1600.0000 mg | ORAL_CAPSULE | Freq: Two times a day (BID) | ORAL | Status: DC
  Administered 2016-10-10: 1600 mg via ORAL
  Filled 2016-10-10: qty 4

## 2016-10-10 MED ORDER — NYSTATIN 100000 UNIT/GM EX POWD
Freq: Two times a day (BID) | CUTANEOUS | Status: DC
  Administered 2016-10-10: 14:00:00 via TOPICAL
  Filled 2016-10-10: qty 15

## 2016-10-10 NOTE — ED Provider Notes (Signed)
[facility] DEPT Provider Note   CSN: [id number] Arrival date & time: 10/09/16  2258  Time seen 01:00 AM   History   Chief Complaint Chief Complaint  Patient presents with  . Rash    abdominal folds    HPI Stephanie Combs is a 66 y.o. female.  HPI  patient states she has rales skin underneath her abdomen for the past 5 days. She states her PCP sent her medication for it that she received 3 days ago. She states she has been using Zeasorb AF, clotrimazole/betamethasone cream, and Triamcinolone and Silvadene cream and gentian violet. She states she's been laying naked in front of a fan to dry the skin. She states it's not getting better. She states yesterday she had fever to 103. She states it was 101 today. She last took 2 Aleve at 10 AM yesterday. She has had nausea without vomiting, she states she had "one and a half episodes" of diarrhea about 6 PM. She states she's had chills. She states earlier today her CBG was 237 and 314 although this evening was 127. She states she's had this before and its a yeast-type infection but not yeast.  PCP [name], MD   Past Medical History:  Diagnosis Date  . Anxiety   . CHF (congestive heart failure) (HCC)    Diastolic  . Chronic back pain   . COPD (chronic obstructive pulmonary disease) ([place])   . Degenerative disk disease   . Diabetes mellitus without complication ([place])   . Hypertension   . Hypothyroidism   . On home O2    2.5 L N/C prn  . Pedal edema     Patient Active Problem List   Diagnosis Date Noted  . Vitamin D deficiency 08/19/2016  . Personal history of noncompliance with medical treatment, presenting hazards to health 06/17/2016  . Left hand weakness 12/06/2015  . CAP (community acquired pneumonia) 07/25/2015  . Pressure ulcer 05/02/2015  . Hyponatremia 05/01/2015  . Acute-on-chronic kidney injury ([place]) 05/01/2015  . Uncontrolled type 2 diabetes mellitus with stage 3 chronic kidney disease ([place]) 05/01/2015  . COPD  exacerbation ([employer]) 04/30/2015  . Cellulitis of foot, right 03/24/2015  . Elevated d-dimer 03/24/2015  . Peripheral edema 12/04/2014  . Bilateral edema of lower extremity   . Diabetic ulcer of right great toe ([place])   . Foot ulcer due to secondary DM ([place]) 11/07/2014  . Diabetic ulcer of right foot ([place]) 11/07/2014  . Edema of lower extremity 05/27/2013  . Headache 05/26/2013  . CKD (chronic kidney disease), stage III 04/14/2013  . Leukocytosis 04/14/2013  . Anemia in CKD (chronic kidney disease) 04/14/2013  . Dyspnea 04/14/2013  . Chronic diastolic CHF (congestive heart failure) ([place])   . Hypothyroidism   . COPD (chronic obstructive pulmonary disease) ([place]) 08/07/2012  . Morbid obesity ([place]) 08/07/2012  . DM type 2 (diabetes mellitus, type 2) ([place]) 08/07/2012  . HTN (hypertension), benign 08/07/2012  . Hypothyroidism (acquired) 08/07/2012    Past Surgical History:  Procedure Laterality Date  . HERNIA REPAIR    . TUBAL LIGATION      OB History    No data available       Home Medications    Prior to Admission medications   Medication Sig Start Date End Date Taking? Authorizing Provider  albuterol (PROAIR HFA) 108 (90 BASE) MCG/ACT inhaler Inhale 1 puff into the lungs every 4 (four) hours as needed. For shortness of breath Patient taking differently: Inhale 2 puffs into the  lungs every 4 (four) hours as needed. For shortness of breath 05/30/13  Yes Buriev, Arie Sabina, MD  alprazolam Duanne Moron) 2 MG tablet Take 2 mg by mouth 3 (three) times daily as needed for sleep or anxiety.   Yes [provider]  aspirin EC 81 MG tablet Take 81 mg by mouth daily.   Yes [provider]  Cholecalciferol (VITAMIN D3) 5000 units CAPS Take 1 capsule (5,000 Units total) by mouth daily. 08/19/16  Yes Nida, Marella Chimes, MD  diltiazem (CARDIZEM CD) 240 MG 24 hr capsule Take 1 capsule (240 mg total) by mouth daily. 05/30/13  Yes Buriev, Arie Sabina, MD  furosemide (LASIX) 20 MG tablet  Take 2 tablets (40 mg total) by mouth 2 (two) times daily. 12/06/14  Yes Isaac Bliss, Rayford Halsted, MD  gabapentin (NEURONTIN) 400 MG capsule Take 3 capsules (1,200 mg total) by mouth 2 (two) times daily. Patient taking differently: Take 1,600 mg by mouth 2 (two) times daily.  05/02/15  Yes Orvan Falconer, MD  glucose blood (ONETOUCH VERIO) test strip Use as instructed 4 x daily. E11.65 08/19/16  Yes Nida, Marella Chimes, MD  insulin detemir (LEVEMIR) 100 UNIT/ML injection Inject 0.5 mLs (50 Units total) into the skin at bedtime. 09/02/16  Yes Nida, Marella Chimes, MD  insulin lispro (HUMALOG) 100 UNIT/ML injection Inject 10-16 Units into the skin 3 (three) times daily before meals.   Yes [provider]  Insulin Syringe-Needle U-100 (INSULIN SYRINGE 1CC/31GX5/16") 31G X 5/16" 1 ML MISC qhs 09/02/16  Yes Nida, Marella Chimes, MD  levothyroxine (SYNTHROID, LEVOTHROID) 100 MCG tablet Take 1 tablet (100 mcg total) by mouth daily before breakfast. Patient taking differently: Take 150 mcg by mouth daily before breakfast.  08/19/16  Yes Nida, Marella Chimes, MD  Liraglutide (VICTOZA [place]) Inject 1.2 mg into the skin daily after breakfast.   Yes [provider]  nystatin (MYCOSTATIN/NYSTOP) 100000 UNIT/GM POWD Apply 1 g topically 2 (two) times daily.   Yes [provider]  Omega-3 Fatty Acids (FISH OIL PO) Take 1 capsule by mouth daily.    Yes [provider]  ondansetron (ZOFRAN ODT) 8 MG disintegrating tablet Take 1 tablet (8 mg total) by mouth every 8 (eight) hours as needed for nausea or vomiting. Patient taking differently: Take 8 mg by mouth daily.  12/31/13  Yes Jola Schmidt, MD  oxyCODONE-acetaminophen (PERCOCET/ROXICET) 5-325 MG tablet Take 2 tablets by mouth every 6 (six) hours as needed for moderate pain. Patient taking differently: Take 1 tablet by mouth every 8 (eight) hours as needed for moderate pain.  05/02/15  Yes Orvan Falconer, MD  potassium chloride (K-DUR) 10 MEQ  tablet Take 20 mEq by mouth 2 (two) times daily.    Yes [provider]  ranitidine (ZANTAC) 150 MG tablet Take 150 mg by mouth daily.   Yes [provider]  sulfamethoxazole-trimethoprim (BACTRIM DS,SEPTRA DS) 800-160 MG tablet Take 1 tablet by mouth 2 (two) times daily.   Yes [provider]    Family History Family History  Problem Relation Age of Onset  . Stroke Mother   . Heart attack Father   . Diabetes Brother   . Cerebral palsy Brother   . Pneumonia Brother   . Diabetes Other   . Heart attack Other     Social History Social History  Substance Use Topics  . Smoking status: Former Smoker    Packs/day: 0.25    Types: Cigarettes    Quit date: 06/27/2015  .  Smokeless tobacco: Never Used  . Alcohol use No  on home oxygen 2 lpm [place] Lives with a friend   Allergies   Dilaudid [hydromorphone hcl]; Actifed cold-allergy [chlorpheniramine-phenylephrine]; Doxycycline; Other; Penicillins; Reglan [metoclopramide]; Valium [diazepam]; and Vistaril [hydroxyzine hcl]   Review of Systems Review of Systems  All other systems reviewed and are negative.    Physical Exam Updated Vital Signs BP (!) 143/61   Pulse 71   Temp 98.3 F (36.8 C) (Oral)   Resp 20   Ht 5\' 7"  (1.702 m)   Wt 123.4 kg (272 lb)   SpO2 100%   BMI 42.60 kg/m   Vital signs normal    Physical Exam  Constitutional: She is oriented to person, place, and time. She appears well-developed and well-nourished.  Non-toxic appearance. She does not appear ill. No distress.  obese  HENT:  Head: Normocephalic and atraumatic.  Right Ear: External ear normal.  Left Ear: External ear normal.  Nose: Nose normal. No mucosal edema or rhinorrhea.  Mouth/Throat: Oropharynx is clear and moist and mucous membranes are normal. No dental abscesses or uvula swelling.  Eyes: Conjunctivae and EOM are normal. Pupils are equal, round, and reactive to light.  Neck: Normal range of motion and full passive  range of motion without pain. Neck supple.  Cardiovascular: Normal rate, regular rhythm and normal heart sounds.  Exam reveals no gallop and no friction rub.   No murmur heard. Pulmonary/Chest: Effort normal and breath sounds normal. No respiratory distress. She has no wheezes. She has no rhonchi. She has no rales. She exhibits no tenderness and no crepitus.  Abdominal: Soft. Normal appearance and bowel sounds are normal. She exhibits no distension. There is no tenderness. There is no rebound and no guarding.  Patient has a very large pannus  Musculoskeletal: Normal range of motion. She exhibits no edema or tenderness.  Moves all extremities well.   Neurological: She is alert and oriented to person, place, and time. She has normal strength. No cranial nerve deficit.  Skin: Skin is warm, dry and intact. No rash noted. No erythema. No pallor.  When the patient's pannus was lifted up she has an area of redness and superficial loss of the epidermis on her left anterior thigh and underneath her left pannus, she also has some smaller areas on the right side. Her skin is discolored from gentian violet.  Psychiatric: She has a normal mood and affect. Her speech is normal and behavior is normal. Her mood appears not anxious.  Nursing note and vitals reviewed.  Right thigh/abdomen     Left thigh and abdomen     ED Treatments / Results  Labs (all labs ordered are listed, but only abnormal results are displayed) Results for orders placed or performed during the hospital encounter of 10/09/16  Comprehensive metabolic panel  Result Value Ref Range   Sodium 138 135 - 145 mmol/L   Potassium 5.1 3.5 - 5.1 mmol/L   Chloride 105 101 - 111 mmol/L   CO2 27 22 - 32 mmol/L   Glucose, Bld 117 (H) 65 - 99 mg/dL   BUN 42 (H) 6 - 20 mg/dL   Creatinine, Ser 1.54 (H) 0.44 - 1.00 mg/dL   Calcium 9.0 8.9 - 10.3 mg/dL   Total Protein 7.0 6.5 - 8.1 g/dL   Albumin 3.6 3.5 - 5.0 g/dL   AST 15 15 - 41 U/L   ALT  13 (L) 14 - 54 U/L   Alkaline Phosphatase 81 38 -  126 U/L   Total Bilirubin 0.4 0.3 - 1.2 mg/dL   GFR calc non Af Amer 34 (L) >60 mL/min   GFR calc Af Amer 39 (L) >60 mL/min   Anion gap 6 5 - 15  CBC with Differential  Result Value Ref Range   WBC 8.0 4.0 - 10.5 K/uL   RBC 3.86 (L) 3.87 - 5.11 MIL/uL   Hemoglobin 11.1 (L) 12.0 - 15.0 g/dL   HCT 34.9 (L) 36.0 - 46.0 %   MCV 90.4 78.0 - 100.0 fL   MCH 28.8 26.0 - 34.0 pg   MCHC 31.8 30.0 - 36.0 g/dL   RDW 13.9 11.5 - 15.5 %   Platelets 327 150 - 400 K/uL   Neutrophils Relative % 57 %   Neutro Abs 4.6 1.7 - 7.7 K/uL   Lymphocytes Relative 30 %   Lymphs Abs 2.4 0.7 - 4.0 K/uL   Monocytes Relative 8 %   Monocytes Absolute 0.7 0.1 - 1.0 K/uL   Eosinophils Relative 4 %   Eosinophils Absolute 0.3 0.0 - 0.7 K/uL   Basophils Relative 1 %   Basophils Absolute 0.1 0.0 - 0.1 K/uL  CBG monitoring, ED  Result Value Ref Range   Glucose-Capillary 109 (H) 65 - 99 mg/dL   Laboratory interpretation all normal except  Stable renal insufficiency    EKG  EKG Interpretation None       Radiology No results found.  Procedures Procedures (including critical care time)  Medications Ordered in ED Medications  vancomycin (VANCOCIN) IVPB 1000 mg/200 mL premix (0 mg Intravenous Stopped 10/10/16 0416)  fluconazole (DIFLUCAN) tablet 100 mg (100 mg Oral Given 10/10/16 0229)     Initial Impression / Assessment and Plan / ED Course  I have reviewed the triage vital signs and the nursing notes.  Pertinent labs & imaging results that were available during my care of the patient were reviewed by me and considered in my medical decision making (see chart for details).  Patient was started on vancomycin due to penicillin allergy, and oral Diflucan.  03:49 Dr Darrick Meigs, recommends oral diflucan and discharge home.   04:40 AM Patient now states she has been on 2 packs of Diflucan at home. She had not told me that before. She finally has decided she needs  to be admitted and continue getting IV antibiotics.  04:51 AM Dr Darrick Meigs, in ED, will admit patient.   Final Clinical Impressions(s) / ED Diagnoses   Final diagnoses:  Yeast infection  Cellulitis of abdominal wall    Plan admission  Rolland Porter, MD, Barbette Or, MD 10/10/16 [phone]

## 2016-10-10 NOTE — Discharge Summary (Signed)
Physician Discharge Summary  [name] RCB:[id number] DOB: Feb 18, 1951 DOA: 10/09/2016  PCP: [name], MD  Admit date: 10/09/2016 Discharge date: 10/10/2016  Admitted From: Home Disposition:  Home   Recommendations for Outpatient Follow-up:  1. Follow up with PCP in 1-2 weeks 2. Take medications as prescribed 3. Please obtain BMP/CBC in one week  Home Health: No Equipment/Devices: None    Discharge Condition:Stable CODE STATUS:DNR Diet recommendation: Heart Healthy / Carb Modified  Brief/Interim Summary: Stephanie Combs  is a 66 y.o. female, With history of diabetes mellitus, morbid obesity came to hospital with rash underneath abdominal skin fold for past 5 days. Patient's PCP prescribed multiple creams including Zeasorb AF,clotrimazole/betamethasone cream, and Triamcinolone and Silvadene cream and gentian violet. Patient also worked tablets of Diflucan and Bactrim but was not getting better. She states that she had a fever of 101 today. Patient also complains of loose bowel movements yesterday. She denies nausea vomiting. No dysuria. She denies chest pain or shortness of breath. Complains of headache.  In the ED patient was given vancomycin and Diflucan.  She was then transitioned to PO terbanifine during hospital stay.  Upon further clarification patient is unsure of what she was taking and using at home for this rash.  She reports she had been using whatever she had at home, leftover from previous rashes.  She was afebrile while in the hospital and tolerated the terbinafine.  She was discharged with instructions to take this for two weeks and to follow up with her PCP for further evaluation.  She was told that this rash may take time to improve.  Patient requested ABD's be given to her at time of discharge and she was given numerous pads to place on her rash.    Discharge Diagnoses:  Active Problems:   Morbid obesity (HCC)   HTN (hypertension), benign   Hypothyroidism    Uncontrolled type 2 diabetes mellitus with stage 3 chronic kidney disease ([place])   Tinea cruris    Discharge Instructions  Discharge Instructions    Call MD for:  difficulty breathing, headache or visual disturbances    Complete by:  As directed    Call MD for:  extreme fatigue    Complete by:  As directed    Call MD for:  hives    Complete by:  As directed    Call MD for:  persistant dizziness or light-headedness    Complete by:  As directed    Call MD for:  persistant nausea and vomiting    Complete by:  As directed    Call MD for:  severe uncontrolled pain    Complete by:  As directed    Call MD for:  temperature >100.4    Complete by:  As directed    Diet - low sodium heart healthy    Complete by:  As directed    Diet Carb Modified    Complete by:  As directed    Discharge instructions    Complete by:  As directed    Please take prescriptions as prescribed Please follow up with Dr. Maudie Mercury in 1-2 weeks   Increase activity slowly    Complete by:  As directed      Allergies as of 10/10/2016      Reactions   Dilaudid [hydromorphone Hcl] Itching   Actifed Cold-allergy [chlorpheniramine-phenylephrine]    "I was sick and red and it didn't agree with me at all"   Doxycycline Nausea And Vomiting   Other Cough  Pt states she is allergic to ragweed and that she starts coughing and sneezing like crazy   Penicillins Hives, Itching   Has patient had a PCN reaction causing immediate rash, facial/tongue/throat swelling, SOB or lightheadedness with hypotension: Yes Has patient had a PCN reaction causing severe rash involving mucus membranes or skin necrosis: No Has patient had a PCN reaction that required hospitalization Yes Has patient had a PCN reaction occurring within the last 10 years: No If all of the above answers are "NO", then may proceed with Cephalosporin use.   Reglan [metoclopramide] Itching   Valium [diazepam] Itching   Vistaril [hydroxyzine Hcl] Itching       Medication List    TAKE these medications   albuterol 108 (90 Base) MCG/ACT inhaler Commonly known as:  PROAIR HFA Inhale 1 puff into the lungs every 4 (four) hours as needed. For shortness of breath What changed:  how much to take  additional instructions   alprazolam 2 MG tablet Commonly known as:  XANAX Take 2 mg by mouth 3 (three) times daily as needed for sleep or anxiety.   aspirin EC 81 MG tablet Take 81 mg by mouth daily.   diltiazem 240 MG 24 hr capsule Commonly known as:  CARDIZEM CD Take 1 capsule (240 mg total) by mouth daily.   FISH OIL PO Take 1 capsule by mouth daily.   furosemide 20 MG tablet Commonly known as:  LASIX Take 2 tablets (40 mg total) by mouth 2 (two) times daily.   gabapentin 400 MG capsule Commonly known as:  NEURONTIN Take 3 capsules (1,200 mg total) by mouth 2 (two) times daily. What changed:  how much to take   insulin detemir 100 UNIT/ML injection Commonly known as:  LEVEMIR Inject 0.5 mLs (50 Units total) into the skin at bedtime.   insulin lispro 100 UNIT/ML injection Commonly known as:  HUMALOG Inject 10-16 Units into the skin 3 (three) times daily before meals.   levothyroxine 100 MCG tablet Commonly known as:  SYNTHROID, LEVOTHROID Take 1 tablet (100 mcg total) by mouth daily before breakfast. What changed:  how much to take   lidocaine-prilocaine cream Commonly known as:  EMLA Apply 1 application topically as needed.   nystatin powder Commonly known as:  MYCOSTATIN/NYSTOP Apply topically 2 (two) times daily.   ondansetron 8 MG disintegrating tablet Commonly known as:  ZOFRAN ODT Take 1 tablet (8 mg total) by mouth every 8 (eight) hours as needed for nausea or vomiting. What changed:  when to take this   oxyCODONE-acetaminophen 5-325 MG tablet Commonly known as:  PERCOCET/ROXICET Take 2 tablets by mouth every 6 (six) hours as needed for moderate pain. What changed:  how much to take  when to take this    potassium chloride 10 MEQ tablet Commonly known as:  K-DUR Take 20 mEq by mouth 2 (two) times daily.   ranitidine 150 MG tablet Commonly known as:  ZANTAC Take 150 mg by mouth daily.   terbinafine 250 MG tablet Commonly known as:  LAMISIL Take 1 tablet (250 mg total) by mouth daily. Start taking on:  10/11/2016   VICTOZA [place] Inject 1.2 mg into the skin daily after breakfast.   Vitamin D3 5000 units Caps Take 1 capsule (5,000 Units total) by mouth daily.      Follow-up Information    [name], MD Follow up on 10/17/2016.   Specialty:  Internal Medicine Why:  appointment time 10:45 AM Contact information: [address] [place] [address] [phone]          Allergies  Allergen Reactions  . Dilaudid [Hydromorphone Hcl] Itching  . Actifed Cold-Allergy [Chlorpheniramine-Phenylephrine]     "I was sick and red and it didn't agree with me at all"  . Doxycycline Nausea And Vomiting  . Other Cough    Pt states she is allergic to ragweed and that she starts coughing and sneezing like crazy  . Penicillins Hives and Itching    Has patient had a PCN reaction causing immediate rash, facial/tongue/throat swelling, SOB or lightheadedness with hypotension: Yes Has patient had a PCN reaction causing severe rash involving mucus membranes or skin necrosis: No Has patient had a PCN reaction that required hospitalization Yes Has patient had a PCN reaction occurring within the last 10 years: No If all of the above answers are "NO", then may proceed with Cephalosporin use.   . Reglan [Metoclopramide] Itching  . Valium [Diazepam] Itching  . Vistaril [Hydroxyzine Hcl] Itching    Consultations:  None   Procedures/Studies: No results found.    Subjective: Patient reports that she is worried she is going to go home and her rash is going to "fever" again.  She does voice some improvement after taking terbinafine and using cream.    Discharge Exam: Vitals:    10/10/16 1200 10/10/16 1551  BP:  (!) 119/45  Pulse:  62  Resp:  18  Temp: 98.7 F (37.1 C) 98.4 F (36.9 C)   Vitals:   10/10/16 0530 10/10/16 0600 10/10/16 1200 10/10/16 1551  BP: (!) 143/58 (!) 136/59  (!) 119/45  Pulse: 67 69  62  Resp:  20  18  Temp:  97.8 F (36.6 C) 98.7 F (37.1 C) 98.4 F (36.9 C)  TempSrc:  Oral Oral Oral  SpO2: 99% 100%  100%  Weight:  (!) 137.5 kg (303 lb 3.2 oz)    Height:  5\' 7"  (1.702 m)      General: Pt is alert, awake, not in acute distress Cardiovascular: RRR, S1/S2 +, no rubs, no gallops Respiratory: CTA bilaterally, no wheezing, no rhonchi Abdominal: Soft, obese, NT, ND, bowel sounds + Extremities: no edema, no cyanosis Skin: erythema in skin folds that is raised with a few possible satellite lesions at border    The results of significant diagnostics from this hospitalization (including imaging, microbiology, ancillary and laboratory) are listed below for reference.     Microbiology: No results found for this or any previous visit (from the past 240 hour(s)).   Labs: BNP (last 3 results) No results for input(s): BNP in the last 8760 hours. Basic Metabolic Panel:  Recent Labs Lab 10/10/16 0225  NA 138  K 5.1  CL 105  CO2 27  GLUCOSE 117*  BUN 42*  CREATININE 1.54*  CALCIUM 9.0   Liver Function Tests:  Recent Labs Lab 10/10/16 0225  AST 15  ALT 13*  ALKPHOS 81  BILITOT 0.4  PROT 7.0  ALBUMIN 3.6   No results for input(s): LIPASE, AMYLASE in the last 168 hours. No results for input(s): AMMONIA in the last 168 hours. CBC:  Recent Labs Lab 10/10/16 0225  WBC 8.0  NEUTROABS 4.6  HGB 11.1*  HCT 34.9*  MCV 90.4  PLT 327   Cardiac Enzymes: No results for input(s): CKTOTAL, CKMB, CKMBINDEX, TROPONINI in the last 168 hours. BNP: Invalid input(s): POCBNP CBG:  Recent Labs Lab 10/10/16 0149 10/10/16 0824 10/10/16 1104  GLUCAP 109* 139* 166*  D-Dimer No results for input(s): DDIMER in the last 72  hours. Hgb A1c No results for input(s): HGBA1C in the last 72 hours. Lipid Profile No results for input(s): CHOL, HDL, LDLCALC, TRIG, CHOLHDL, LDLDIRECT in the last 72 hours. Thyroid function studies No results for input(s): TSH, T4TOTAL, T3FREE, THYROIDAB in the last 72 hours.  Invalid input(s): FREET3 Anemia work up No results for input(s): VITAMINB12, FOLATE, FERRITIN, TIBC, IRON, RETICCTPCT in the last 72 hours. Urinalysis    Component Value Date/Time   COLORURINE COLORLESS (A) 10/10/2016 1343   APPEARANCEUR CLEAR 10/10/2016 1343   LABSPEC 1.005 10/10/2016 1343   PHURINE 5.0 10/10/2016 1343   GLUCOSEU NEGATIVE 10/10/2016 1343   HGBUR SMALL (A) 10/10/2016 1343   BILIRUBINUR NEGATIVE 10/10/2016 1343   KETONESUR NEGATIVE 10/10/2016 1343   PROTEINUR NEGATIVE 10/10/2016 1343   UROBILINOGEN 0.2 12/04/2014 0650   NITRITE NEGATIVE 10/10/2016 1343   LEUKOCYTESUR NEGATIVE 10/10/2016 1343   Sepsis Labs Invalid input(s): PROCALCITONIN,  WBC,  LACTICIDVEN Microbiology No results found for this or any previous visit (from the past 240 hour(s)).   Time coordinating discharge: 40 minutes  SIGNED:   Loretha Stapler, MD  [facility] 10/10/2016, 4:42 PM Pager [phone] If 7PM-7AM, please contact night-coverage [email or web address] Password [email or web address]

## 2016-10-10 NOTE — Progress Notes (Signed)
Dr. Tomi Bamberger called regarding admission for this patient, discussed in detail. Patient came with skin rash underneath abdominal folds, has been treated with gentian violet, 1, Silvadene cream, betamethasone, clotrimazole as outpatient. Patient has been afebrile in the ED. WBC 8.0. Blood pressure stable 117/62, pulse 70, respiration 20. Patient received vancomycin and Diflucan in the ED. I recommended patient can be discharged home on Diflucan for 7 days. Dr. Tomi Bamberger agrees with the plan, and will discuss with patient.

## 2016-10-10 NOTE — Care Management Obs Status (Signed)
[facility] NOTIFICATION   Patient Details  Name: Stephanie Combs MRN: [id number] Date of Birth: June 25, 1950   Medicare Observation Status Notification Given:  Yes    Reford Olliff, Chauncey Reading, RN 10/10/2016, 10:15 AM

## 2016-10-10 NOTE — Care Management Note (Signed)
Case Management Note  Patient Details  Name: Stephanie Combs MRN: [id number] Date of Birth: 05/19/1950  Subjective/Objective:    Adm with cellulitis. From home, ind PTA. Walks with RW. Has PCP, still drives, reports no issues affording medications. Reports having setup for continuous oxygen, but does not always wear continuously.                  Action/Plan: Plans to return home with self care. No CM needs known.    Expected Discharge Date:   10/11/2016          Expected Discharge Plan:  Home/Self Care  In-House Referral:     Discharge planning Services  CM Consult  Post Acute Care Choice:    Choice offered to:  NA  DME Arranged:    DME Agency:     HH Arranged:    HH Agency:     Status of Service:  Completed, signed off  If discussed at [employer] of Stay Meetings, dates discussed:    Additional Comments:  Lilyanah Celestin, Chauncey Reading, RN 10/10/2016, 12:11 PM

## 2016-10-10 NOTE — Progress Notes (Signed)
IV discontinued,catheter intact. Patient discharged home with instructions given on medications,and follow up visits, patient verbalized understanding. Prescriptions sent to Pharmacy of choice documented on AVS. Staff to accompany patient to awaiting vehicle.

## 2016-10-10 NOTE — Progress Notes (Signed)
ANTIBIOTIC CONSULT NOTE-Preliminary  Pharmacy Consult for Vancomycin Indication: Cellulitis  Allergies  Allergen Reactions  . Dilaudid [Hydromorphone Hcl] Itching  . Actifed Cold-Allergy [Chlorpheniramine-Phenylephrine]     "I was sick and red and it didn't agree with me at all"  . Doxycycline Nausea And Vomiting  . Other Cough    Pt states she is allergic to ragweed and that she starts coughing and sneezing like crazy  . Penicillins Hives and Itching    Has patient had a PCN reaction causing immediate rash, facial/tongue/throat swelling, SOB or lightheadedness with hypotension: Yes Has patient had a PCN reaction causing severe rash involving mucus membranes or skin necrosis: No Has patient had a PCN reaction that required hospitalization Yes Has patient had a PCN reaction occurring within the last 10 years: No If all of the above answers are "NO", then may proceed with Cephalosporin use.   . Reglan [Metoclopramide] Itching  . Valium [Diazepam] Itching  . Vistaril [Hydroxyzine Hcl] Itching    Patient Measurements: Height: 5\' 7"  (170.2 cm) Weight: 272 lb (123.4 kg) IBW/kg (Calculated) : 61.6  Vital Signs: Temp: 97.9 F (36.6 C) (06/01 0433) Temp Source: Oral (06/01 0433) BP: 143/58 (06/01 0530) Pulse Rate: 67 (06/01 0530)  Labs:  Recent Labs  10/10/16 0225  WBC 8.0  HGB 11.1*  PLT 327  CREATININE 1.54*    Estimated Creatinine Clearance: 49 mL/min (A) (by C-G formula based on SCr of 1.54 mg/dL (H)).  No results for input(s): VANCOTROUGH, VANCOPEAK, VANCORANDOM, GENTTROUGH, GENTPEAK, GENTRANDOM, TOBRATROUGH, TOBRAPEAK, TOBRARND, AMIKACINPEAK, AMIKACINTROU, AMIKACIN in the last 72 hours.   Microbiology: No results found for this or any previous visit (from the past 720 hour(s)).  Medical History: Past Medical History:  Diagnosis Date  . Anxiety   . CHF (congestive heart failure) (HCC)    Diastolic  . Chronic back pain   . COPD (chronic obstructive pulmonary  disease) ([place])   . Degenerative disk disease   . Diabetes mellitus without complication ([place])   . Hypertension   . Hypothyroidism   . On home O2    2.5 L N/C prn  . Pedal edema     Medications:  Vancomycin 1 Gm IV given in the ED  Assessment: 66 yo morbidly obese female seen in the ED for 5 day history of rash under her abdominal skin folds. Topical creams prescribed but Pt now has fever, and loose bowel movements. Pt to be started on Vancomycin for cellulitis.  Goal of Therapy:  Vancomycin troughs 10-15 mcg/ml  Plan:  Preliminary review of pertinent patient information completed.  Protocol will be initiated with a one-time dose of Vancomycin 1500 mg IV to be given in addition to the 1 Gm dose received in the ED, for a total loading dose of 2500 mg.  Forestine Na clinical pharmacist will complete review during morning rounds to assess patient and finalize treatment regimen if needed.  Norberto Sorenson, [facility] 10/10/2016,6:20 AM

## 2016-10-10 NOTE — Progress Notes (Signed)
Pharmacy Antibiotic Note  Stephanie Combs is a 66 y.o. female admitted on 10/09/2016 with cellulitis.  Pharmacy has been consulted for Vancomycin dosing.  Plan: Vancomycin 1500mg  IV every 24 hours.  Goal trough 10-15 mcg/mL. (1gm given in ED this AM) F/U cxs and clinical progress Deescalate tx as indicated  Height: 5\' 7"  (170.2 cm) Weight: (!) 303 lb 3.2 oz (137.5 kg) IBW/kg (Calculated) : 61.6  Temp (24hrs), Avg:98 F (36.7 C), Min:97.8 F (36.6 C), Max:98.3 F (36.8 C)   Recent Labs Lab 10/10/16 0225  WBC 8.0  CREATININE 1.54*    Normalized CrCl 66mls/min Estimated Creatinine Clearance: 52.2 mL/min (A) (by C-G formula based on SCr of 1.54 mg/dL (H)).    Allergies  Allergen Reactions  . Dilaudid [Hydromorphone Hcl] Itching  . Actifed Cold-Allergy [Chlorpheniramine-Phenylephrine]     "I was sick and red and it didn't agree with me at all"  . Doxycycline Nausea And Vomiting  . Other Cough    Pt states she is allergic to ragweed and that she starts coughing and sneezing like crazy  . Penicillins Hives and Itching    Has patient had a PCN reaction causing immediate rash, facial/tongue/throat swelling, SOB or lightheadedness with hypotension: Yes Has patient had a PCN reaction causing severe rash involving mucus membranes or skin necrosis: No Has patient had a PCN reaction that required hospitalization Yes Has patient had a PCN reaction occurring within the last 10 years: No If all of the above answers are "NO", then may proceed with Cephalosporin use.   . Reglan [Metoclopramide] Itching  . Valium [Diazepam] Itching  . Vistaril [Hydroxyzine Hcl] Itching    Antimicrobials this admission: Vancomcyin 6/1 >>  Fluconazole 6/1 >>   Dose adjustments this admission: N/A  Microbiology results: No cultures Thank you for allowing pharmacy to be a part of this patient's care.  Isac Sarna, BS Pharm D, [place] Clinical Pharmacist Pager [phone] 10/10/2016 8:37 AM

## 2016-10-10 NOTE — H&P (Signed)
TRH H&P    Patient Demographics:    Stephanie Combs, is a 66 y.o. female  MRN: [id number]  DOB - 10-19-1950  Admit Date - 10/09/2016  Referring MD/NP/PA: Dr Tomi Bamberger  Outpatient Primary MD for the patient is [name], MD  Patient coming from:   Chief Complaint  Patient presents with  . Rash    abdominal folds      HPI:    Stephanie Combs  is a 66 y.o. female, With history of diabetes mellitus, morbid obesity came to hospital with rash underneath abdominal skin fold for past 5 days. Patient's PCP prescribed multiple creams including Zeasorb AF,clotrimazole/betamethasone cream, and Triamcinolone and Silvadene cream and gentian violet. Patient also worked tablets of Diflucan and Bactrim but was not getting better. She states that she had a fever of 101 today. Patient also complains of loose bowel movements yesterday. She denies nausea vomiting. No dysuria. She denies chest pain or shortness of breath. Complains of headache.  In the ED patient was given vancomycin and Diflucan.      Review of systems:     A full 10 point Review of Systems was done, except as stated above, all other Review of Systems were negative.   With Past History of the following :    Past Medical History:  Diagnosis Date  . Anxiety   . CHF (congestive heart failure) (HCC)    Diastolic  . Chronic back pain   . COPD (chronic obstructive pulmonary disease) ([place])   . Degenerative disk disease   . Diabetes mellitus without complication ([place])   . Hypertension   . Hypothyroidism   . On home O2    2.5 L N/C prn  . Pedal edema       Past Surgical History:  Procedure Laterality Date  . HERNIA REPAIR    . TUBAL LIGATION        Social History:      Social History  Substance Use Topics  . Smoking status: Former Smoker    Packs/day: 0.25    Types: Cigarettes    Quit date: 06/27/2015  . Smokeless tobacco: Never Used  .  Alcohol use No       Family History :     Family History  Problem Relation Age of Onset  . Stroke Mother   . Heart attack Father   . Diabetes Brother   . Cerebral palsy Brother   . Pneumonia Brother   . Diabetes Other   . Heart attack Other       Home Medications:   Prior to Admission medications   Medication Sig Start Date End Date Taking? Authorizing Provider  albuterol (PROAIR HFA) 108 (90 BASE) MCG/ACT inhaler Inhale 1 puff into the lungs every 4 (four) hours as needed. For shortness of breath Patient taking differently: Inhale 2 puffs into the lungs every 4 (four) hours as needed. For shortness of breath 05/30/13  Yes Buriev, Arie Sabina, MD  alprazolam Duanne Moron) 2 MG tablet Take 2 mg by mouth 3 (three) times daily as needed for  sleep or anxiety.   Yes [provider]  aspirin EC 81 MG tablet Take 81 mg by mouth daily.   Yes [provider]  Cholecalciferol (VITAMIN D3) 5000 units CAPS Take 1 capsule (5,000 Units total) by mouth daily. 08/19/16  Yes Nida, Marella Chimes, MD  diltiazem (CARDIZEM CD) 240 MG 24 hr capsule Take 1 capsule (240 mg total) by mouth daily. 05/30/13  Yes Buriev, Arie Sabina, MD  furosemide (LASIX) 20 MG tablet Take 2 tablets (40 mg total) by mouth 2 (two) times daily. 12/06/14  Yes Isaac Bliss, Rayford Halsted, MD  gabapentin (NEURONTIN) 400 MG capsule Take 3 capsules (1,200 mg total) by mouth 2 (two) times daily. Patient taking differently: Take 1,600 mg by mouth 2 (two) times daily.  05/02/15  Yes Orvan Falconer, MD  glucose blood (ONETOUCH VERIO) test strip Use as instructed 4 x daily. E11.65 08/19/16  Yes Nida, Marella Chimes, MD  insulin detemir (LEVEMIR) 100 UNIT/ML injection Inject 0.5 mLs (50 Units total) into the skin at bedtime. 09/02/16  Yes Nida, Marella Chimes, MD  insulin lispro (HUMALOG) 100 UNIT/ML injection Inject 10-16 Units into the skin 3 (three) times daily before meals.   Yes [provider]  Insulin Syringe-Needle U-100  (INSULIN SYRINGE 1CC/31GX5/16") 31G X 5/16" 1 ML MISC qhs 09/02/16  Yes Nida, Marella Chimes, MD  levothyroxine (SYNTHROID, LEVOTHROID) 100 MCG tablet Take 1 tablet (100 mcg total) by mouth daily before breakfast. Patient taking differently: Take 150 mcg by mouth daily before breakfast.  08/19/16  Yes Nida, Marella Chimes, MD  Liraglutide (VICTOZA [place]) Inject 1.2 mg into the skin daily after breakfast.   Yes [provider]  nystatin (MYCOSTATIN/NYSTOP) 100000 UNIT/GM POWD Apply 1 g topically 2 (two) times daily.   Yes [provider]  Omega-3 Fatty Acids (FISH OIL PO) Take 1 capsule by mouth daily.    Yes [provider]  ondansetron (ZOFRAN ODT) 8 MG disintegrating tablet Take 1 tablet (8 mg total) by mouth every 8 (eight) hours as needed for nausea or vomiting. Patient taking differently: Take 8 mg by mouth daily.  12/31/13  Yes Jola Schmidt, MD  oxyCODONE-acetaminophen (PERCOCET/ROXICET) 5-325 MG tablet Take 2 tablets by mouth every 6 (six) hours as needed for moderate pain. Patient taking differently: Take 1 tablet by mouth every 8 (eight) hours as needed for moderate pain.  05/02/15  Yes Orvan Falconer, MD  potassium chloride (K-DUR) 10 MEQ tablet Take 20 mEq by mouth 2 (two) times daily.    Yes [provider]  ranitidine (ZANTAC) 150 MG tablet Take 150 mg by mouth daily.   Yes [provider]  sulfamethoxazole-trimethoprim (BACTRIM DS,SEPTRA DS) 800-160 MG tablet Take 1 tablet by mouth 2 (two) times daily.   Yes [provider]     Allergies:     Allergies  Allergen Reactions  . Dilaudid [Hydromorphone Hcl] Itching  . Actifed Cold-Allergy [Chlorpheniramine-Phenylephrine]     "I was sick and red and it didn't agree with me at all"  . Doxycycline Nausea And Vomiting  . Other Cough    Pt states she is allergic to ragweed and that she starts coughing and sneezing like crazy  . Penicillins Hives and Itching    Has patient had a PCN  reaction causing immediate rash, facial/tongue/throat swelling, SOB or lightheadedness with hypotension: Yes Has patient had a PCN reaction causing severe rash involving mucus membranes or skin necrosis: No Has patient had a PCN reaction that required hospitalization Yes  Has patient had a PCN reaction occurring within the last 10 years: No If all of the above answers are "NO", then may proceed with Cephalosporin use.   . Reglan [Metoclopramide] Itching  . Valium [Diazepam] Itching  . Vistaril [Hydroxyzine Hcl] Itching     Physical Exam:   Vitals  Blood pressure 117/62, pulse 70, temperature 97.9 F (36.6 C), temperature source Oral, resp. rate 20, height 5\' 7"  (1.702 m), weight 123.4 kg (272 lb), SpO2 99 %.  1.  General: Appears in no acute distress  2. Psychiatric:  Intact judgement and  insight, awake alert, oriented x 3.  3. Neurologic: No focal neurological deficits, all cranial nerves intact.Strength 5/5 all 4 extremities, sensation intact all 4 extremities, plantars down going.  4. Eyes :  anicteric sclerae, moist conjunctivae with no lid lag. PERRLA.  5. ENMT:  Oropharynx clear with moist mucous membranes and good dentition  6. Neck:  supple, no cervical lymphadenopathy appriciated, No thyromegaly  7. Respiratory : Normal respiratory effort, good air movement bilaterally,clear to  auscultation bilaterally  8. Cardiovascular : RRR, no gallops, rubs or murmurs, no leg edema  9. Gastrointestinal:  Positive bowel sounds, abdomen soft, non-tender to palpation,no hepatosplenomegaly, no rigidity or guarding       10. Skin:      11.Musculoskeletal:  Good muscle tone,  joints appear normal , no effusions,  normal range of motion    Data Review:    CBC  Recent Labs Lab 10/10/16 0225  WBC 8.0  HGB 11.1*  HCT 34.9*  PLT 327  MCV 90.4  MCH 28.8  MCHC 31.8  RDW 13.9  LYMPHSABS 2.4  MONOABS 0.7  EOSABS 0.3  BASOSABS 0.1    ------------------------------------------------------------------------------------------------------------------  Chemistries   Recent Labs Lab 10/10/16 0225  NA 138  K 5.1  CL 105  CO2 27  GLUCOSE 117*  BUN 42*  CREATININE 1.54*  CALCIUM 9.0  AST 15  ALT 13*  ALKPHOS 81  BILITOT 0.4   ------------------------------------------------------------------------------------------------------------------  ------------------------------------------------------------------------------------------------------------------ GFR: Estimated Creatinine Clearance: 49 mL/min (A) (by C-G formula based on SCr of 1.54 mg/dL (H)). Liver Function Tests:  Recent Labs Lab 10/10/16 0225  AST 15  ALT 13*  ALKPHOS 81  BILITOT 0.4  PROT 7.0  ALBUMIN 3.6   No results for input(s): LIPASE, AMYLASE in the last 168 hours. No results for input(s): AMMONIA in the last 168 hours. Coagulation Profile: No results for input(s): INR, PROTIME in the last 168 hours. Cardiac Enzymes: No results for input(s): CKTOTAL, CKMB, CKMBINDEX, TROPONINI in the last 168 hours. BNP (last 3 results) No results for input(s): PROBNP in the last 8760 hours. HbA1C: No results for input(s): HGBA1C in the last 72 hours. CBG:  Recent Labs Lab 10/10/16 0149  GLUCAP 109*   Lipid Profile: No results for input(s): CHOL, HDL, LDLCALC, TRIG, CHOLHDL, LDLDIRECT in the last 72 hours. Thyroid Function Tests: No results for input(s): TSH, T4TOTAL, FREET4, T3FREE, THYROIDAB in the last 72 hours. Anemia Panel: No results for input(s): VITAMINB12, FOLATE, FERRITIN, TIBC, IRON, RETICCTPCT in the last 72 hours.  --------------------------------------------------------------------------------------------------------------- Urine analysis:    Component Value Date/Time   COLORURINE YELLOW 07/25/2015 0910   APPEARANCEUR CLEAR 07/25/2015 0910   LABSPEC 1.020 07/25/2015 0910   PHURINE 5.0 07/25/2015 0910   GLUCOSEU >1000  (A) 07/25/2015 0910   HGBUR LARGE (A) 07/25/2015 0910   BILIRUBINUR NEGATIVE 07/25/2015 0910   KETONESUR NEGATIVE 07/25/2015 0910   PROTEINUR 30 (A) 07/25/2015 0910  UROBILINOGEN 0.2 12/04/2014 0650   NITRITE NEGATIVE 07/25/2015 0910   LEUKOCYTESUR NEGATIVE 07/25/2015 0910      Imaging Results:      Assessment & Plan:    Active Problems:   Morbid obesity ([place])   HTN (hypertension), benign   Hypothyroidism   Uncontrolled type 2 diabetes mellitus with stage 3 chronic kidney disease (HCC)   Cellulitis   1. Cellulitis- place under observation, continue Silvadene ointment twice a day, will add nystatin/triamcinolone 0.1% cream twice a day. We'll start vancomycin per pharmacy consultation, Diflucan 100 mg IV daily. 2. Diabetes mellitus-will start resistance sliding scale insulin, continue Levemir 50 units subcutaneous daily 3. Hypertension-continue Cardizem daily. 4. Chronic diastolic CHF-continue Lasix 40 mg by mouth twice a day   DVT Prophylaxis-   Lovenox   AM Labs Ordered, also please review Full Orders  Family Communication: Admission, patients condition and plan of care including tests being ordered have been discussed with the patient  who indicate understanding and agree with the plan and Code Status.  Code Status:  DO NOT RESUSCITATE  Admission status: Observation    Time spent in minutes : 60 minutes   Leshon Armistead S M.D on 10/10/2016 at 5:22 AM  Between 7am to 7pm - Pager - [phone]. After 7pm go to [email or web address] - password [facility]  [facility] - Office  [phone]

## 2016-10-13 LAB — GLUCOSE, CAPILLARY: Glucose-Capillary: 176 mg/dL — ABNORMAL HIGH (ref 65–99)

## 2016-10-21 DIAGNOSIS — M5416 Radiculopathy, lumbar region: Secondary | ICD-10-CM | POA: Diagnosis not present

## 2016-10-24 DIAGNOSIS — E118 Type 2 diabetes mellitus with unspecified complications: Secondary | ICD-10-CM | POA: Diagnosis not present

## 2016-10-24 DIAGNOSIS — B354 Tinea corporis: Secondary | ICD-10-CM | POA: Diagnosis not present

## 2016-10-31 DIAGNOSIS — J449 Chronic obstructive pulmonary disease, unspecified: Secondary | ICD-10-CM | POA: Diagnosis not present

## 2016-11-01 DIAGNOSIS — E1165 Type 2 diabetes mellitus with hyperglycemia: Secondary | ICD-10-CM | POA: Diagnosis not present

## 2016-11-11 DIAGNOSIS — E118 Type 2 diabetes mellitus with unspecified complications: Secondary | ICD-10-CM | POA: Diagnosis not present

## 2016-11-11 DIAGNOSIS — I10 Essential (primary) hypertension: Secondary | ICD-10-CM | POA: Diagnosis not present

## 2016-11-11 DIAGNOSIS — E039 Hypothyroidism, unspecified: Secondary | ICD-10-CM | POA: Diagnosis not present

## 2016-11-11 LAB — BASIC METABOLIC PANEL
BUN: 28 — AB (ref 4–21)
Creatinine: 1.5 — AB (ref <=1.1)

## 2016-11-11 LAB — HEMOGLOBIN A1C: Hemoglobin A1C: 7.5

## 2016-11-11 LAB — TSH: TSH: 28 — AB (ref <=5.90)

## 2016-11-14 DIAGNOSIS — I10 Essential (primary) hypertension: Secondary | ICD-10-CM | POA: Diagnosis not present

## 2016-11-14 DIAGNOSIS — E782 Mixed hyperlipidemia: Secondary | ICD-10-CM | POA: Diagnosis not present

## 2016-11-14 DIAGNOSIS — E039 Hypothyroidism, unspecified: Secondary | ICD-10-CM | POA: Diagnosis not present

## 2016-11-27 ENCOUNTER — Other Ambulatory Visit: Payer: Self-pay

## 2016-11-27 ENCOUNTER — Ambulatory Visit (INDEPENDENT_AMBULATORY_CARE_PROVIDER_SITE_OTHER): Payer: Medicare Other | Admitting: "Endocrinology

## 2016-11-27 VITALS — HR 82 | Ht 66.0 in | Wt 321.0 lb

## 2016-11-27 DIAGNOSIS — IMO0002 Reserved for concepts with insufficient information to code with codable children: Secondary | ICD-10-CM

## 2016-11-27 DIAGNOSIS — I10 Essential (primary) hypertension: Secondary | ICD-10-CM | POA: Diagnosis not present

## 2016-11-27 DIAGNOSIS — E039 Hypothyroidism, unspecified: Secondary | ICD-10-CM

## 2016-11-27 DIAGNOSIS — E1122 Type 2 diabetes mellitus with diabetic chronic kidney disease: Secondary | ICD-10-CM | POA: Diagnosis not present

## 2016-11-27 DIAGNOSIS — Z9119 Patient's noncompliance with other medical treatment and regimen: Secondary | ICD-10-CM

## 2016-11-27 DIAGNOSIS — N183 Chronic kidney disease, stage 3 (moderate): Secondary | ICD-10-CM

## 2016-11-27 DIAGNOSIS — Z91199 Patient's noncompliance with other medical treatment and regimen due to unspecified reason: Secondary | ICD-10-CM

## 2016-11-27 DIAGNOSIS — Z794 Long term (current) use of insulin: Secondary | ICD-10-CM

## 2016-11-27 DIAGNOSIS — E1165 Type 2 diabetes mellitus with hyperglycemia: Secondary | ICD-10-CM

## 2016-11-27 MED ORDER — INSULIN LISPRO 100 UNIT/ML ~~LOC~~ SOLN: 10.0000 [IU] | Freq: Three times a day (TID) | SUBCUTANEOUS | 2 refills | Status: DC

## 2016-11-27 MED ORDER — INSULIN DETEMIR 100 UNIT/ML ~~LOC~~ SOLN: 50.0000 [IU] | Freq: Every day | SUBCUTANEOUS | 2 refills | Status: DC

## 2016-11-27 MED ORDER — "INSULIN SYRINGE 29G X 1/2"" 0.5 ML MISC": 3 refills | Status: DC

## 2016-11-27 NOTE — Progress Notes (Signed)
Subjective:    Patient ID: Stephanie Combs, female    DOB: January 10, 1951. Patient is being seen in f/u for management of diabetes requested by  [name], MD  Past Medical History:  Diagnosis Date  . Anxiety   . CHF (congestive heart failure) (HCC)    Diastolic  . Chronic back pain   . COPD (chronic obstructive pulmonary disease) ([place])   . Degenerative disk disease   . Diabetes mellitus without complication ([place])   . Hypertension   . Hypothyroidism   . On home O2    2.5 L N/C prn  . Pedal edema    Past Surgical History:  Procedure Laterality Date  . HERNIA REPAIR    . TUBAL LIGATION     Social History   Social History  . Marital status: Widowed    Spouse name: N/A  . Number of children: N/A  . Years of education: N/A   Social History Main Topics  . Smoking status: Former Smoker    Packs/day: 0.25    Types: Cigarettes    Quit date: 06/27/2015  . Smokeless tobacco: Never Used  . Alcohol use No  . Drug use: No  . Sexual activity: Not Currently    Birth control/ protection: Surgical   Other Topics Concern  . Not on file   Social History Narrative  . No narrative on file   Outpatient Encounter Prescriptions as of 11/27/2016  Medication Sig  . diltiazem (CARDIZEM CD) 300 MG 24 hr capsule Take 300 mg by mouth daily.  Marland Kitchen albuterol (PROAIR HFA) 108 (90 BASE) MCG/ACT inhaler Inhale 1 puff into the lungs every 4 (four) hours as needed. For shortness of breath (Patient taking differently: Inhale 2 puffs into the lungs every 4 (four) hours as needed. For shortness of breath)  . alprazolam (XANAX) 2 MG tablet Take 2 mg by mouth 3 (three) times daily as needed for sleep or anxiety.  Marland Kitchen aspirin EC 81 MG tablet Take 81 mg by mouth daily.  . Cholecalciferol (VITAMIN D3) 5000 units CAPS Take 1 capsule (5,000 Units total) by mouth daily.  . furosemide (LASIX) 20 MG tablet Take 2 tablets (40 mg total) by mouth 2 (two) times daily.  Marland Kitchen gabapentin (NEURONTIN) 400 MG capsule Take 3  capsules (1,200 mg total) by mouth 2 (two) times daily. (Patient taking differently: Take 1,600 mg by mouth 2 (two) times daily. )  . insulin detemir (LEVEMIR) 100 UNIT/ML injection Inject 0.5 mLs (50 Units total) into the skin at bedtime.  . insulin lispro (HUMALOG) 100 UNIT/ML injection Inject 10-16 Units into the skin 3 (three) times daily before meals.  . INSULIN SYRINGE .5CC/29G 29G X 1/2" 0.5 ML MISC Use to inject insulin 4 times a day.  . levothyroxine (SYNTHROID, LEVOTHROID) 100 MCG tablet Take 1 tablet (100 mcg total) by mouth daily before breakfast. (Patient taking differently: Take 200 mcg by mouth daily before breakfast. )  . lidocaine-prilocaine (EMLA) cream Apply 1 application topically as needed.  . Liraglutide (VICTOZA [place]) Inject 1.2 mg into the skin daily after breakfast.  . nystatin (MYCOSTATIN/NYSTOP) powder Apply topically 2 (two) times daily.  . Omega-3 Fatty Acids (FISH OIL PO) Take 1 capsule by mouth daily.   . ondansetron (ZOFRAN ODT) 8 MG disintegrating tablet Take 1 tablet (8 mg total) by mouth every 8 (eight) hours as needed for nausea or vomiting. (Patient taking differently: Take 8 mg by mouth daily. )  . oxyCODONE-acetaminophen (PERCOCET/ROXICET) 5-325 MG tablet Take  2 tablets by mouth every 6 (six) hours as needed for moderate pain. (Patient taking differently: Take 1 tablet by mouth every 8 (eight) hours as needed for moderate pain. )  . potassium chloride (K-DUR) 10 MEQ tablet Take 20 mEq by mouth 2 (two) times daily.   . ranitidine (ZANTAC) 150 MG tablet Take 150 mg by mouth daily.  . [DISCONTINUED] diltiazem (CARDIZEM CD) 240 MG 24 hr capsule Take 1 capsule (240 mg total) by mouth daily.   No facility-administered encounter medications on file as of 11/27/2016.    ALLERGIES: Allergies  Allergen Reactions  . Dilaudid [Hydromorphone Hcl] Itching  . Actifed Cold-Allergy [Chlorpheniramine-Phenylephrine]     "I was sick and red and it didn't agree with me at all"   . Doxycycline Nausea And Vomiting  . Other Cough    Pt states she is allergic to ragweed and that she starts coughing and sneezing like crazy  . Penicillins Hives and Itching    Has patient had a PCN reaction causing immediate rash, facial/tongue/throat swelling, SOB or lightheadedness with hypotension: Yes Has patient had a PCN reaction causing severe rash involving mucus membranes or skin necrosis: No Has patient had a PCN reaction that required hospitalization Yes Has patient had a PCN reaction occurring within the last 10 years: No If all of the above answers are "NO", then may proceed with Cephalosporin use.   . Reglan [Metoclopramide] Itching  . Valium [Diazepam] Itching  . Vistaril [Hydroxyzine Hcl] Itching   VACCINATION STATUS: Immunization History  Administered Date(s) Administered  . Pneumococcal Polysaccharide-23 08/08/2012    Diabetes  She presents for her follow-up diabetic visit. She has type 2 diabetes mellitus. Onset time: She was diagnosed at approximate age of 66 years. Her disease course has been improving. There are no hypoglycemic associated symptoms. Pertinent negatives for hypoglycemia include no confusion, headaches, pallor or seizures. Associated symptoms include blurred vision, fatigue, polydipsia and polyuria. Pertinent negatives for diabetes include no chest pain and no polyphagia. There are no hypoglycemic complications. Symptoms are improving. Diabetic complications include a CVA, heart disease, nephropathy, peripheral neuropathy, PVD and retinopathy. Risk factors for coronary artery disease include diabetes mellitus, dyslipidemia, hypertension, obesity and sedentary lifestyle. Current diabetic treatment includes insulin injections (She is not very clear on her medications. Her medication list in the referral packet includes Levemir, Humalog, Giardia, metformin.). Her weight is increasing rapidly. She is following a generally unhealthy diet. When asked about meal  planning, she reported none. She has had a previous visit with a dietitian. She never participates in exercise. Her breakfast blood glucose range is generally 140-180 mg/dl. Her lunch blood glucose range is generally 140-180 mg/dl. Her dinner blood glucose range is generally 140-180 mg/dl. Her bedtime blood glucose range is generally 140-180 mg/dl. Her overall blood glucose range is 140-180 mg/dl. (She did not bring any meter nor logs to review today. She admits that she is she does not monitor blood glucose regularly. )  Hyperlipidemia  This is a chronic problem. The current episode started more than 1 year ago. Exacerbating diseases include diabetes and obesity. Pertinent negatives include no chest pain, myalgias or shortness of breath. Risk factors for coronary artery disease include diabetes mellitus, dyslipidemia, hypertension, obesity, a sedentary lifestyle and post-menopausal.  Hypertension  This is a chronic problem. The current episode started more than 1 year ago. Associated symptoms include blurred vision. Pertinent negatives include no chest pain, headaches, palpitations or shortness of breath. Risk factors for coronary artery disease include diabetes  mellitus, dyslipidemia, family history, obesity, sedentary lifestyle and smoking/tobacco exposure. Hypertensive end-organ damage includes CVA, PVD and retinopathy.     Review of Systems  Constitutional: Positive for fatigue. Negative for chills, fever and unexpected weight change.  HENT: Negative for trouble swallowing and voice change.   Eyes: Positive for blurred vision. Negative for visual disturbance.  Respiratory: Negative for cough, shortness of breath and wheezing.   Cardiovascular: Negative for chest pain, palpitations and leg swelling.  Gastrointestinal: Negative for diarrhea, nausea and vomiting.  Endocrine: Positive for polydipsia and polyuria. Negative for cold intolerance, heat intolerance and polyphagia.  Musculoskeletal:  Negative for arthralgias and myalgias.  Skin: Negative for color change, pallor, rash and wound.  Neurological: Negative for seizures and headaches.  Psychiatric/Behavioral: Negative for confusion and suicidal ideas.    Objective:    Pulse 82   Ht 5\' 6"  (1.676 m)   Wt (!) 321 lb (145.6 kg)   BMI 51.81 kg/m   Wt Readings from Last 3 Encounters:  11/27/16 (!) 321 lb (145.6 kg)  10/10/16 (!) 303 lb 3.2 oz (137.5 kg)  08/19/16 (!) 301 lb (136.5 kg)    Physical Exam  Constitutional: She is oriented to person, place, and time. She appears well-developed.  HENT:  Head: Normocephalic and atraumatic.  Eyes: EOM are normal.  Neck: Normal range of motion. Neck supple. No tracheal deviation present. No thyromegaly present.  Cardiovascular: Normal rate and regular rhythm.   Pulmonary/Chest: Effort normal and breath sounds normal.  Abdominal: Soft. Bowel sounds are normal. There is no tenderness. There is no guarding.  Musculoskeletal: She exhibits edema and deformity.  As large bilateral lower extremities due to lymphedema. He uses a walker to get around.  Neurological: She is alert and oriented to person, place, and time. She has normal reflexes. No cranial nerve deficit. Coordination normal.  Skin: Skin is warm and dry. No rash noted. No erythema. No pallor.  Psychiatric: She has a normal mood and affect.  Reluctant affect. She is difficult to engage.     CMP     Component Value Date/Time   NA 138 10/10/2016 0225   K 5.1 10/10/2016 0225   CL 105 10/10/2016 0225   CO2 27 10/10/2016 0225   GLUCOSE 117 (H) 10/10/2016 0225   BUN 28 (A) 11/11/2016   CREATININE 1.5 (A) 11/11/2016   CREATININE 1.54 (H) 10/10/2016 0225   CALCIUM 9.0 10/10/2016 0225   PROT 7.0 10/10/2016 0225   ALBUMIN 3.6 10/10/2016 0225   AST 15 10/10/2016 0225   ALT 13 (L) 10/10/2016 0225   ALKPHOS 81 10/10/2016 0225   BILITOT 0.4 10/10/2016 0225   GFRNONAA 34 (L) 10/10/2016 0225   GFRAA 39 (L) 10/10/2016 0225      Diabetic Labs (most recent): Lab Results  Component Value Date   HGBA1C 7.5 11/11/2016   HGBA1C 9.6 08/09/2016   HGBA1C 9.6 (H) 12/05/2015     Lipid Panel ( most recent) Lipid Panel     Component Value Date/Time   CHOL 263 (A) 08/09/2016   TRIG 138 08/09/2016   HDL 64 08/09/2016   CHOLHDL 4.8 12/06/2015 0603   VLDL 37 12/06/2015 0603   LDLCALC 171 08/09/2016      Assessment & Plan:   1. Uncontrolled type 2 diabetes mellitus with stage 3 chronic kidney disease, with long-term current use of insulin ([place])  - Patient has currently uncontrolled symptomatic type 2 DM since  66 years of age. - She came with her logs, Showing  better blood glucose profile. 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 These are all discussed in detail with the patient.  - I have counseled the patient on diet management and weight loss, by adopting a carbohydrate restricted/protein rich diet.  - Suggestion is made for patient to avoid simple carbohydrates   from her diet including Cakes , Desserts, Ice Cream,  Soda (  diet and regular) , Sweet Tea , Candies,  Chips, Cookies, Artificial Sweeteners,   and "Sugar-free" Products . This will help patient to have stable blood glucose profile and potentially avoid unintended weight gain.  - I encouraged the patient to switch to  unprocessed or minimally processed complex starch and increased protein intake (animal or plant source), fruits, and vegetables.  - Patient is advised to stick to a routine mealtimes to eat 3 meals  a day and avoid unnecessary snacks ( to snack only to correct hypoglycemia).  - The patient will be scheduled with Jearld Fenton, RDN, CDE for individualized DM education.  - I have approached patient with the following individualized plan to manage diabetes and patient agrees:   - She came with significant improvement in her A1c to 9.5% from  9.6%.  -   I advised her to continue Levemir  50 units daily at bedtime, continue Humalog 10 units 3 times a day before meals for pre-meal BG readings of 90-150mg /dl, plus patient specific correction dose for unexpected hyperglycemia above 150mg /dl, associated with strict monitoring of glucose  AC and HS. - Patient is warned not to take insulin without proper monitoring per orders. -Adjustment parameters are given for hypo and hyperglycemia in writing. -Patient is encouraged to call clinic for blood glucose levels less than 70 or above 300 mg /dl.  - She is not a good candidate for metformin, Jardiance, risk outweighs benefit for this patient/ CKD. - I advised her to continue Victoza  1.2 mg subcutaneously daily. - Patient is asked to bring all her medications for reconciliation in a week. - Patient specific target  A1c;  LDL, HDL, Triglycerides, and  Waist Circumference were discussed in detail.  2) BP/HTN: Controlled. Continue current medications. She is not on ACE inhibitor's. She will be considered for low-dose lisinopril if she doesn't have it at home. 3) Lipids/HPL:    Control unknown. she'll be considered for low-dose statin next visit. 4)  Weight/Diet: CDE Consult has been  initiated , exercise, and detailed carbohydrates information provided. 5) hypothyroidism: She reports history of hypothyroidism since age 36.  - Her levothyroxine dose was increased to 200 g in the interim by her PMD. Her TSH has improved to 28 from 45. - I advised her to stay on L-thyroxine 200 mcg by mouth every morning.  - We discussed about correct intake of levothyroxine, at fasting, with water, separated by at least 30 minutes from breakfast, and separated by more than 4 hours from  calcium, iron, multivitamins, acid reflux medications (PPIs). -Patient is made aware of the fact that thyroid hormone replacement is needed for life, dose to be adjusted by periodic monitoring of thyroid function tests.  6) vitamin D deficiency: Her vitamin D level was  low at 12 prior to her last visit. -  She is currently on vitamin  D3 5000 units daily for the next 90 days.  7) Chronic Care/Health Maintenance:  -Patient is  encouraged to continue to follow up with Ophthalmology, nephrology  Podiatrist at least yearly or according to recommendations, and advised to   stay away from smoking. I have recommended yearly flu vaccine and pneumonia vaccination at least every 5 years; moderate intensity exercise for up to 150 minutes weekly; and  sleep for at least 7 hours a day.  - 20 minutes of time was spent on the care of this patient , 50% of which was applied for counseling on diabetes complications and their preventions.  - Patient to bring meter and  blood glucose logs during her next visit.   - I advised patient to maintain close follow up with [name], MD for primary care needs.  Follow up plan: - Return in about 3 months (around 02/27/2017) for meter, and logs.  Glade Lloyd, MD Phone: [phone]  Fax: [phone]   11/27/2016, 4:35 PM

## 2016-11-27 NOTE — Patient Instructions (Signed)

## 2016-11-28 ENCOUNTER — Emergency Department (HOSPITAL_COMMUNITY)
Admission: EM | Admit: 2016-11-28 | Discharge: 2016-11-29 | Disposition: A | Discharge status: Home / Self Care | Payer: Medicare Other | Attending: Emergency Medicine | Admitting: Emergency Medicine

## 2016-11-28 ENCOUNTER — Encounter (HOSPITAL_COMMUNITY): Payer: Self-pay | Admitting: *Deleted

## 2016-11-28 ENCOUNTER — Encounter: Payer: Self-pay | Admitting: "Endocrinology

## 2016-11-28 DIAGNOSIS — R42 Dizziness and giddiness: Secondary | ICD-10-CM | POA: Diagnosis not present

## 2016-11-28 DIAGNOSIS — I5032 Chronic diastolic (congestive) heart failure: Secondary | ICD-10-CM | POA: Diagnosis not present

## 2016-11-28 DIAGNOSIS — Z7982 Long term (current) use of aspirin: Secondary | ICD-10-CM | POA: Diagnosis not present

## 2016-11-28 DIAGNOSIS — N183 Chronic kidney disease, stage 3 (moderate): Secondary | ICD-10-CM | POA: Insufficient documentation

## 2016-11-28 DIAGNOSIS — J441 Chronic obstructive pulmonary disease with (acute) exacerbation: Secondary | ICD-10-CM | POA: Insufficient documentation

## 2016-11-28 DIAGNOSIS — E119 Type 2 diabetes mellitus without complications: Secondary | ICD-10-CM | POA: Insufficient documentation

## 2016-11-28 DIAGNOSIS — Z794 Long term (current) use of insulin: Secondary | ICD-10-CM | POA: Insufficient documentation

## 2016-11-28 DIAGNOSIS — Z79899 Other long term (current) drug therapy: Secondary | ICD-10-CM | POA: Diagnosis not present

## 2016-11-28 DIAGNOSIS — R404 Transient alteration of awareness: Secondary | ICD-10-CM | POA: Diagnosis not present

## 2016-11-28 DIAGNOSIS — Z87891 Personal history of nicotine dependence: Secondary | ICD-10-CM | POA: Diagnosis not present

## 2016-11-28 DIAGNOSIS — E039 Hypothyroidism, unspecified: Secondary | ICD-10-CM | POA: Diagnosis not present

## 2016-11-28 DIAGNOSIS — I129 Hypertensive chronic kidney disease with stage 1 through stage 4 chronic kidney disease, or unspecified chronic kidney disease: Secondary | ICD-10-CM | POA: Diagnosis not present

## 2016-11-28 DIAGNOSIS — T383X1A Poisoning by insulin and oral hypoglycemic [antidiabetic] drugs, accidental (unintentional), initial encounter: Secondary | ICD-10-CM | POA: Diagnosis not present

## 2016-11-28 DIAGNOSIS — R061 Stridor: Secondary | ICD-10-CM | POA: Diagnosis not present

## 2016-11-28 LAB — CBC WITH DIFFERENTIAL/PLATELET
Basophils Absolute: 0.1 10*3/uL (ref 0.0–0.1)
Basophils Relative: 1 %
Eosinophils Absolute: 0.2 10*3/uL (ref 0.0–0.7)
Eosinophils Relative: 3 %
HCT: 30.2 % — ABNORMAL LOW (ref 36.0–46.0)
Hemoglobin: 9.3 g/dL — ABNORMAL LOW (ref 12.0–15.0)
Lymphocytes Relative: 30 %
Lymphs Abs: 2 10*3/uL (ref 0.7–4.0)
MCH: 27.5 pg (ref 26.0–34.0)
MCHC: 30.8 g/dL (ref 30.0–36.0)
MCV: 89.3 fL (ref 78.0–100.0)
Monocytes Absolute: 0.5 10*3/uL (ref 0.1–1.0)
Monocytes Relative: 8 %
Neutro Abs: 3.9 10*3/uL (ref 1.7–7.7)
Neutrophils Relative %: 58 %
Platelets: 297 10*3/uL (ref 150–400)
RBC: 3.38 MIL/uL — ABNORMAL LOW (ref 3.87–5.11)
RDW: 14.5 % (ref 11.5–15.5)
WBC: 6.7 10*3/uL (ref 4.0–10.5)

## 2016-11-28 LAB — COMPREHENSIVE METABOLIC PANEL
ALT: 14 U/L (ref 14–54)
AST: 15 U/L (ref 15–41)
Albumin: 3.7 g/dL (ref 3.5–5.0)
Alkaline Phosphatase: 73 U/L (ref 38–126)
Anion gap: 8 (ref 5–15)
BUN: 28 mg/dL — ABNORMAL HIGH (ref 6–20)
CO2: 32 mmol/L (ref 22–32)
Calcium: 8.9 mg/dL (ref 8.9–10.3)
Chloride: 102 mmol/L (ref 101–111)
Creatinine, Ser: 1.46 mg/dL — ABNORMAL HIGH (ref 0.44–1.00)
GFR calc Af Amer: 42 mL/min — ABNORMAL LOW (ref >60)
GFR calc non Af Amer: 36 mL/min — ABNORMAL LOW (ref >60)
Glucose, Bld: 211 mg/dL — ABNORMAL HIGH (ref 65–99)
Potassium: 4.3 mmol/L (ref 3.5–5.1)
Sodium: 142 mmol/L (ref 135–145)
Total Bilirubin: 0.7 mg/dL (ref 0.3–1.2)
Total Protein: 6.7 g/dL (ref 6.5–8.1)

## 2016-11-28 LAB — CBG MONITORING, ED
Glucose-Capillary: 227 mg/dL — ABNORMAL HIGH (ref 65–99)
Glucose-Capillary: 192 mg/dL — ABNORMAL HIGH (ref 65–99)

## 2016-11-28 MED ORDER — SODIUM CHLORIDE 0.9 % IV BOLUS (SEPSIS)
500.0000 mL | Freq: Once | INTRAVENOUS | Status: AC
  Administered 2016-11-28: 500 mL via INTRAVENOUS

## 2016-11-28 NOTE — ED Triage Notes (Signed)
Pt brought in by [facility] for c/o overdose; pt states she took 50 units of humalog instead of her levimir; pt states her cbg at home was in the upper 400's pt is c/o some diarrhea and chest pain; pt given aspirin 324mg  en route by ems

## 2016-11-29 NOTE — ED Provider Notes (Signed)
[facility] DEPT Provider Note   CSN: [id number] Arrival date & time: 11/28/16  1956     History   Chief Complaint Chief Complaint  Patient presents with  . Drug Overdose    HPI Stephanie Combs is a 66 y.o. female.  HPI Patient presents with accidental insulin overdose. States that around 6:00 today she took 30 units of Humalog instead of her 50 units of Levemir. She took 20 units of Levemir instead of 20 units of Humalog. Patient states she's felt a little shaky since then. States she called her doctor was told to come in to the hospital. Sugar was 400 at home. 200 upon arrival. States she just got confused because she was talking to someone on the phone. States her sugars been well-controlled otherwise with the best control she's had recently due according to her hemoglobin A1c. Past Medical History:  Diagnosis Date  . Anxiety   . CHF (congestive heart failure) (HCC)    Diastolic  . Chronic back pain   . COPD (chronic obstructive pulmonary disease) ([place])   . Degenerative disk disease   . Diabetes mellitus without complication ([place])   . Hypertension   . Hypothyroidism   . On home O2    2.5 L N/C prn  . Pedal edema     Patient Active Problem List   Diagnosis Date Noted  . Tinea cruris 10/10/2016  . Vitamin D deficiency 08/19/2016  . Personal history of noncompliance with medical treatment, presenting hazards to health 06/17/2016  . Left hand weakness 12/06/2015  . CAP (community acquired pneumonia) 07/25/2015  . Pressure ulcer 05/02/2015  . Hyponatremia 05/01/2015  . Acute-on-chronic kidney injury ([place]) 05/01/2015  . Uncontrolled type 2 diabetes mellitus with stage 3 chronic kidney disease ([place]) 05/01/2015  . COPD exacerbation ([place]) 04/30/2015  . Cellulitis of foot, right 03/24/2015  . Elevated d-dimer 03/24/2015  . Peripheral edema 12/04/2014  . Bilateral edema of lower extremity   . Diabetic ulcer of right great toe ([place])   . Foot ulcer due to secondary DM  ([place]) 11/07/2014  . Diabetic ulcer of right foot ([place]) 11/07/2014  . Edema of lower extremity 05/27/2013  . Headache 05/26/2013  . CKD (chronic kidney disease), stage III 04/14/2013  . Leukocytosis 04/14/2013  . Anemia in CKD (chronic kidney disease) 04/14/2013  . Dyspnea 04/14/2013  . Chronic diastolic CHF (congestive heart failure) ([place])   . Hypothyroidism   . COPD (chronic obstructive pulmonary disease) ([place]) 08/07/2012  . Morbid obesity ([place]) 08/07/2012  . DM type 2 (diabetes mellitus, type 2) ([place]) 08/07/2012  . HTN (hypertension), benign 08/07/2012  . Hypothyroidism (acquired) 08/07/2012    Past Surgical History:  Procedure Laterality Date  . HERNIA REPAIR    . TUBAL LIGATION      OB History    No data available       Home Medications    Prior to Admission medications   Medication Sig Start Date End Date Taking? Authorizing Provider  albuterol (PROAIR HFA) 108 (90 BASE) MCG/ACT inhaler Inhale 1 puff into the lungs every 4 (four) hours as needed. For shortness of breath Patient taking differently: Inhale 2 puffs into the lungs every 4 (four) hours as needed. For shortness of breath 05/30/13   Kinnie Feil, MD  alprazolam Duanne Moron) 2 MG tablet Take 2 mg by mouth 3 (three) times daily as needed for sleep or anxiety.    [provider]  aspirin EC 81 MG tablet Take 81 mg by mouth  daily.    [provider]  Cholecalciferol (VITAMIN D3) 5000 units CAPS Take 1 capsule (5,000 Units total) by mouth daily. 08/19/16   Cassandria Anger, MD  diltiazem (CARDIZEM CD) 300 MG 24 hr capsule Take 300 mg by mouth daily.    [provider]  furosemide (LASIX) 20 MG tablet Take 2 tablets (40 mg total) by mouth 2 (two) times daily. 12/06/14   Isaac Bliss, Rayford Halsted, MD  gabapentin (NEURONTIN) 400 MG capsule Take 3 capsules (1,200 mg total) by mouth 2 (two) times daily. Patient taking differently: Take 1,600 mg by mouth 2 (two) times daily.  05/02/15   Orvan Falconer, MD  insulin detemir (LEVEMIR) 100 UNIT/ML injection Inject 0.5 mLs (50 Units total) into the skin at bedtime. 11/27/16   Cassandria Anger, MD  insulin lispro (HUMALOG) 100 UNIT/ML injection Inject 0.1-0.16 mLs (10-16 Units total) into the skin 3 (three) times daily before meals. 11/27/16   Cassandria Anger, MD  INSULIN SYRINGE .5CC/29G 29G X 1/2" 0.5 ML MISC Use to inject insulin 4 times a day. 11/27/16   Cassandria Anger, MD  levothyroxine (SYNTHROID, LEVOTHROID) 100 MCG tablet Take 1 tablet (100 mcg total) by mouth daily before breakfast. Patient taking differently: Take 200 mcg by mouth daily before breakfast.  08/19/16   Nida, Marella Chimes, MD  lidocaine-prilocaine (EMLA) cream Apply 1 application topically as needed. 10/10/16   Eber Jones, MD  Liraglutide (VICTOZA [place]) Inject 1.2 mg into the skin daily after breakfast.    [provider]  nystatin (MYCOSTATIN/NYSTOP) powder Apply topically 2 (two) times daily. 10/10/16   Eber Jones, MD  Omega-3 Fatty Acids (FISH OIL PO) Take 1 capsule by mouth daily.     [provider]  ondansetron (ZOFRAN ODT) 8 MG disintegrating tablet Take 1 tablet (8 mg total) by mouth every 8 (eight) hours as needed for nausea or vomiting. Patient taking differently: Take 8 mg by mouth daily.  12/31/13   Jola Schmidt, MD  oxyCODONE-acetaminophen (PERCOCET/ROXICET) 5-325 MG tablet Take 2 tablets by mouth every 6 (six) hours as needed for moderate pain. Patient taking differently: Take 1 tablet by mouth every 8 (eight) hours as needed for moderate pain.  05/02/15   Orvan Falconer, MD  potassium chloride (K-DUR) 10 MEQ tablet Take 20 mEq by mouth 2 (two) times daily.     [provider]  ranitidine (ZANTAC) 150 MG tablet Take 150 mg by mouth daily.    [provider]    Family History Family History  Problem Relation Age of Onset  . Stroke Mother   . Heart attack Father   . Diabetes Brother   .  Cerebral palsy Brother   . Pneumonia Brother   . Diabetes Other   . Heart attack Other     Social History Social History  Substance Use Topics  . Smoking status: Former Smoker    Packs/day: 0.25    Types: Cigarettes    Quit date: 06/27/2015  . Smokeless tobacco: Never Used  . Alcohol use No     Allergies   Dilaudid [hydromorphone hcl]; Actifed cold-allergy [chlorpheniramine-phenylephrine]; Doxycycline; Other; Penicillins; Reglan [metoclopramide]; Valium [diazepam]; and Vistaril [hydroxyzine hcl]   Review of Systems Review of Systems  Constitutional: Negative for appetite change and fever.  HENT: Negative for congestion.   Respiratory: Negative for shortness of breath.   Gastrointestinal: Negative for abdominal distention.  Genitourinary: Negative for flank pain.  Musculoskeletal: Negative for arthralgias.  Neurological:  Positive for tremors.  Psychiatric/Behavioral: Negative for confusion. The patient is nervous/anxious.      Physical Exam Updated Vital Signs BP 130/69   Pulse 71   Temp 97.6 F (36.4 C) (Oral)   Resp 18   Ht 5\' 6"  (1.676 m)   Wt (!) 144.7 kg (319 lb)   SpO2 93%   BMI 51.49 kg/m   Physical Exam  Constitutional: She appears well-developed.  HENT:  Head: Atraumatic.  Neck: Neck supple.  Cardiovascular: Normal rate.   Pulmonary/Chest: Effort normal.  Abdominal: Soft.  Neurological: She is alert.  Skin: Skin is warm. Capillary refill takes less than 2 seconds.  Psychiatric:  Patient appears somewhat anxious.     ED Treatments / Results  Labs (all labs ordered are listed, but only abnormal results are displayed) Labs Reviewed  CBC WITH DIFFERENTIAL/PLATELET - Abnormal; Notable for the following:       Result Value   RBC 3.38 (*)    Hemoglobin 9.3 (*)    HCT 30.2 (*)    All other components within normal limits  COMPREHENSIVE METABOLIC PANEL - Abnormal; Notable for the following:    Glucose, Bld 211 (*)    BUN 28 (*)    Creatinine,  Ser 1.46 (*)    GFR calc non Af Amer 36 (*)    GFR calc Af Amer 42 (*)    All other components within normal limits  CBG MONITORING, ED - Abnormal; Notable for the following:    Glucose-Capillary 227 (*)    All other components within normal limits  CBG MONITORING, ED - Abnormal; Notable for the following:    Glucose-Capillary 192 (*)    All other components within normal limits    EKG  EKG Interpretation None       Radiology No results found.  Procedures Procedures (including critical care time)  Medications Ordered in ED Medications  sodium chloride 0.9 % bolus 500 mL (500 mLs Intravenous New Bag/Given 11/28/16 2045)     Initial Impression / Assessment and Plan / ED Course  I have reviewed the triage vital signs and the nursing notes.  Pertinent labs & imaging results that were available during my care of the patient were reviewed by me and considered in my medical decision making (see chart for details).     Patient with accidental insulin overdose. Discussed with poison control. Needs 5 hours of monitoring. Sugars have not gone low. Does have mild anemia that will need to get followed. Discharge home.  Final Clinical Impressions(s) / ED Diagnoses   Final diagnoses:  Insulin overdose, accidental or unintentional, initial encounter    New Prescriptions New Prescriptions   No medications on file     Davonna Belling, MD 11/29/16 [phone]

## 2016-11-30 DIAGNOSIS — J449 Chronic obstructive pulmonary disease, unspecified: Secondary | ICD-10-CM | POA: Diagnosis not present

## 2016-12-01 ENCOUNTER — Emergency Department (HOSPITAL_COMMUNITY): Payer: Medicare Other

## 2016-12-01 ENCOUNTER — Emergency Department (HOSPITAL_COMMUNITY)
Admission: EM | Admit: 2016-12-01 | Discharge: 2016-12-01 | Disposition: A | Discharge status: Home / Self Care | Payer: Medicare Other | Attending: Emergency Medicine | Admitting: Emergency Medicine

## 2016-12-01 ENCOUNTER — Encounter (HOSPITAL_COMMUNITY): Payer: Self-pay | Admitting: Emergency Medicine

## 2016-12-01 DIAGNOSIS — Z794 Long term (current) use of insulin: Secondary | ICD-10-CM | POA: Insufficient documentation

## 2016-12-01 DIAGNOSIS — Z7984 Long term (current) use of oral hypoglycemic drugs: Secondary | ICD-10-CM | POA: Diagnosis not present

## 2016-12-01 DIAGNOSIS — E11649 Type 2 diabetes mellitus with hypoglycemia without coma: Secondary | ICD-10-CM | POA: Insufficient documentation

## 2016-12-01 DIAGNOSIS — R404 Transient alteration of awareness: Secondary | ICD-10-CM | POA: Diagnosis not present

## 2016-12-01 DIAGNOSIS — J449 Chronic obstructive pulmonary disease, unspecified: Secondary | ICD-10-CM | POA: Diagnosis not present

## 2016-12-01 DIAGNOSIS — I509 Heart failure, unspecified: Secondary | ICD-10-CM | POA: Insufficient documentation

## 2016-12-01 DIAGNOSIS — Z87891 Personal history of nicotine dependence: Secondary | ICD-10-CM | POA: Insufficient documentation

## 2016-12-01 DIAGNOSIS — E039 Hypothyroidism, unspecified: Secondary | ICD-10-CM | POA: Insufficient documentation

## 2016-12-01 DIAGNOSIS — I517 Cardiomegaly: Secondary | ICD-10-CM | POA: Diagnosis not present

## 2016-12-01 DIAGNOSIS — R531 Weakness: Secondary | ICD-10-CM

## 2016-12-01 DIAGNOSIS — I110 Hypertensive heart disease with heart failure: Secondary | ICD-10-CM | POA: Diagnosis not present

## 2016-12-01 DIAGNOSIS — M6281 Muscle weakness (generalized): Secondary | ICD-10-CM | POA: Insufficient documentation

## 2016-12-01 DIAGNOSIS — R42 Dizziness and giddiness: Secondary | ICD-10-CM | POA: Diagnosis not present

## 2016-12-01 DIAGNOSIS — E11641 Type 2 diabetes mellitus with hypoglycemia with coma: Secondary | ICD-10-CM | POA: Diagnosis not present

## 2016-12-01 DIAGNOSIS — E162 Hypoglycemia, unspecified: Secondary | ICD-10-CM

## 2016-12-01 LAB — URINALYSIS, ROUTINE W REFLEX MICROSCOPIC
Bilirubin Urine: NEGATIVE
Glucose, UA: NEGATIVE mg/dL
Hgb urine dipstick: NEGATIVE
Ketones, ur: NEGATIVE mg/dL
Leukocytes, UA: NEGATIVE
Nitrite: NEGATIVE
Protein, ur: 30 mg/dL — AB
Specific Gravity, Urine: 1.013 (ref 1.005–1.030)
pH: 6 (ref 5.0–8.0)

## 2016-12-01 LAB — CBC WITH DIFFERENTIAL/PLATELET
Basophils Absolute: 0.1 10*3/uL (ref 0.0–0.1)
Basophils Relative: 1 %
Eosinophils Absolute: 0 10*3/uL (ref 0.0–0.7)
Eosinophils Relative: 0 %
HCT: 29.6 % — ABNORMAL LOW (ref 36.0–46.0)
Hemoglobin: 9.1 g/dL — ABNORMAL LOW (ref 12.0–15.0)
Lymphocytes Relative: 16 %
Lymphs Abs: 1.5 10*3/uL (ref 0.7–4.0)
MCH: 27.2 pg (ref 26.0–34.0)
MCHC: 30.7 g/dL (ref 30.0–36.0)
MCV: 88.6 fL (ref 78.0–100.0)
Monocytes Absolute: 0.8 10*3/uL (ref 0.1–1.0)
Monocytes Relative: 9 %
Neutro Abs: 7.1 10*3/uL (ref 1.7–7.7)
Neutrophils Relative %: 74 %
Platelets: 311 10*3/uL (ref 150–400)
RBC: 3.34 MIL/uL — ABNORMAL LOW (ref 3.87–5.11)
RDW: 14.6 % (ref 11.5–15.5)
WBC: 9.5 10*3/uL (ref 4.0–10.5)

## 2016-12-01 LAB — COMPREHENSIVE METABOLIC PANEL
ALT: 14 U/L (ref 14–54)
AST: 19 U/L (ref 15–41)
Albumin: 3.4 g/dL — ABNORMAL LOW (ref 3.5–5.0)
Alkaline Phosphatase: 71 U/L (ref 38–126)
Anion gap: 7 (ref 5–15)
BUN: 25 mg/dL — ABNORMAL HIGH (ref 6–20)
CO2: 30 mmol/L (ref 22–32)
Calcium: 8.5 mg/dL — ABNORMAL LOW (ref 8.9–10.3)
Chloride: 102 mmol/L (ref 101–111)
Creatinine, Ser: 1.47 mg/dL — ABNORMAL HIGH (ref 0.44–1.00)
GFR calc Af Amer: 42 mL/min — ABNORMAL LOW (ref >60)
GFR calc non Af Amer: 36 mL/min — ABNORMAL LOW (ref >60)
Glucose, Bld: 119 mg/dL — ABNORMAL HIGH (ref 65–99)
Potassium: 3.8 mmol/L (ref 3.5–5.1)
Sodium: 139 mmol/L (ref 135–145)
Total Bilirubin: 0.4 mg/dL (ref 0.3–1.2)
Total Protein: 6.4 g/dL — ABNORMAL LOW (ref 6.5–8.1)

## 2016-12-01 LAB — I-STAT TROPONIN, ED: Troponin i, poc: 0.01 ng/mL (ref 0.00–0.08)

## 2016-12-01 LAB — CBG MONITORING, ED
Glucose-Capillary: 123 mg/dL — ABNORMAL HIGH (ref 65–99)
Glucose-Capillary: 202 mg/dL — ABNORMAL HIGH (ref 65–99)

## 2016-12-01 NOTE — ED Provider Notes (Signed)
Emergency Department Provider Note   I have reviewed the triage vital signs and the nursing notes.   HISTORY  Chief Complaint Hypoglycemia   HPI Stephanie Combs is a 66 y.o. female with PMH of CHF, COPD on home O2, IDDM, HTN, and Hypothyroidism presents to the emergency department for evaluation of hypoglycemia. Patient states that she suddenly felt "terrible" and checked her blood sugar which was 50. She reportedly drank several non-diet sodas and ate a peanut butter sandwich prior to calling EMS. She denies any chest pain or difficulty breathing. She was seen in the emergency department 2 days ago with accidental insulin overdose. She states this morning she took the appropriate amount of her insulin. When she woke this morning her blood sugar was 80 and she took her Levemir. She typically does not eat breakfast except for a pack of Nab crackers which she has done for the last 15 years. She denies any fevers or chills. She does feel like she has increased fluid on her abdomen is asking for IV Lasix. Patient reports to have a conversation with her primary care physician prior to ED presentation. Changes in any of her medications recently including her insulin.    Past Medical History:  Diagnosis Date  . Anxiety   . CHF (congestive heart failure) (HCC)    Diastolic  . Chronic back pain   . COPD (chronic obstructive pulmonary disease) ([place])   . Degenerative disk disease   . Diabetes mellitus without complication ([place])   . Hypertension   . Hypothyroidism   . On home O2    2.5 L N/C prn  . Pedal edema     Patient Active Problem List   Diagnosis Date Noted  . Tinea cruris 10/10/2016  . Vitamin D deficiency 08/19/2016  . Personal history of noncompliance with medical treatment, presenting hazards to health 06/17/2016  . Left hand weakness 12/06/2015  . CAP (community acquired pneumonia) 07/25/2015  . Pressure ulcer 05/02/2015  . Hyponatremia 05/01/2015  . Acute-on-chronic  kidney injury ([place]) 05/01/2015  . Uncontrolled type 2 diabetes mellitus with stage 3 chronic kidney disease ([place]) 05/01/2015  . COPD exacerbation ([place]) 04/30/2015  . Cellulitis of foot, right 03/24/2015  . Elevated d-dimer 03/24/2015  . Peripheral edema 12/04/2014  . Bilateral edema of lower extremity   . Diabetic ulcer of right great toe ([place])   . Foot ulcer due to secondary DM ([place]) 11/07/2014  . Diabetic ulcer of right foot ([place]) 11/07/2014  . Edema of lower extremity 05/27/2013  . Headache 05/26/2013  . CKD (chronic kidney disease), stage III 04/14/2013  . Leukocytosis 04/14/2013  . Anemia in CKD (chronic kidney disease) 04/14/2013  . Dyspnea 04/14/2013  . Chronic diastolic CHF (congestive heart failure) ([place])   . Hypothyroidism   . COPD (chronic obstructive pulmonary disease) ([place]) 08/07/2012  . Morbid obesity ([place]) 08/07/2012  . DM type 2 (diabetes mellitus, type 2) ([place]) 08/07/2012  . HTN (hypertension), benign 08/07/2012  . Hypothyroidism (acquired) 08/07/2012    Past Surgical History:  Procedure Laterality Date  . HERNIA REPAIR    . TUBAL LIGATION      Current Outpatient Rx  . Order #: [id number] Class: Print  . Order #: [id number] Class: Historical Med  . Order #: [id number] Class: Historical Med  . Order #: [id number] Class: Normal  . Order #: [id number] Class: Historical Med  . Order #: [id number] Class: Print  . Order #: [id number] Class: Normal  . Order #: [id number] Class: Normal  . Order #: [id number] Class: Normal  .  Order #: [id number] Class: Normal  . Order #: [id number] Class: Historical Med  . Order #: [id number] Class: Normal  . Order #: [id number] Class: Historical Med  . Order #: [id number] Class: Print  . Order #: [id number] Class: Historical Med  . Order #: [id number] Class: Historical Med  . Order #: [id number] Class: Historical Med  . Order #: [id number] Class: Normal  . Order #: [id number] Class: Normal    Allergies Dilaudid [hydromorphone hcl]; Actifed cold-allergy  [chlorpheniramine-phenylephrine]; Doxycycline; Other; Penicillins; Reglan [metoclopramide]; Valium [diazepam]; and Vistaril [hydroxyzine hcl]  Family History  Problem Relation Age of Onset  . Stroke Mother   . Heart attack Father   . Diabetes Brother   . Cerebral palsy Brother   . Pneumonia Brother   . Diabetes Other   . Heart attack Other     Social History Social History  Substance Use Topics  . Smoking status: Former Smoker    Packs/day: 0.25    Types: Cigarettes    Quit date: 06/27/2015  . Smokeless tobacco: Never Used  . Alcohol use No    Review of Systems  Constitutional: No fever/chills. Positive generalized weakness and hypoglycemia at home.  Eyes: No visual changes. ENT: No sore throat. Cardiovascular: Denies chest pain. Respiratory: Denies shortness of breath. Gastrointestinal: No abdominal pain.  No nausea, no vomiting.  No diarrhea.  No constipation. Genitourinary: Negative for dysuria. Musculoskeletal: Negative for back pain. Skin: Negative for rash. Neurological: Negative for headaches, focal weakness or numbness.  10-point ROS otherwise negative.  ____________________________________________   PHYSICAL EXAM:  VITAL SIGNS: ED Triage Vitals  Enc Vitals Group     BP 12/01/16 1611 (!) 108/48     Pulse Rate 12/01/16 1611 77     Resp 12/01/16 1611 20     Temp 12/01/16 1611 98.8 F (37.1 C)     Temp src --      SpO2 12/01/16 1611 100 %     Weight 12/01/16 1608 (!) 321 lb (145.6 kg)     Height 12/01/16 1608 5\' 6"  (1.676 m)     Pain Score 12/01/16 1608 0   Constitutional: Alert and oriented. Well appearing and in no acute distress. Eyes: Conjunctivae are normal.  Head: Atraumatic. Nose: No congestion/rhinnorhea. Mouth/Throat: Mucous membranes are moist.  Neck: No stridor.   Cardiovascular: Normal rate, regular rhythm. Good peripheral circulation. Grossly normal heart sounds.   Respiratory: Normal respiratory effort.  No retractions. Lungs  CTAB. Gastrointestinal: Soft and nontender. No distention.  Musculoskeletal: No lower extremity tenderness. Trace B/L LE edema. No abdominal wall edema noted. No gross deformities of extremities. Neurologic:  Normal speech and language. No gross focal neurologic deficits are appreciated.  Skin:  Skin is warm, dry and intact. No rash noted.  ____________________________________________   LABS (all labs ordered are listed, but only abnormal results are displayed)  Labs Reviewed  COMPREHENSIVE METABOLIC PANEL - Abnormal; Notable for the following:       Result Value   Glucose, Bld 119 (*)    BUN 25 (*)    Creatinine, Ser 1.47 (*)    Calcium 8.5 (*)    Total Protein 6.4 (*)    Albumin 3.4 (*)    GFR calc non Af Amer 36 (*)    GFR calc Af Amer 42 (*)    All other components within normal limits  CBC WITH DIFFERENTIAL/PLATELET - Abnormal; Notable for the following:    RBC 3.34 (*)    Hemoglobin 9.1 (*)    HCT 29.6 (*)    All other components  within normal limits  URINALYSIS, ROUTINE W REFLEX MICROSCOPIC - Abnormal; Notable for the following:    Protein, ur 30 (*)    Bacteria, UA RARE (*)    Squamous Epithelial / LPF 0-5 (*)    All other components within normal limits  CBG MONITORING, ED - Abnormal; Notable for the following:    Glucose-Capillary 123 (*)    All other components within normal limits  CBG MONITORING, ED - Abnormal; Notable for the following:    Glucose-Capillary 202 (*)    All other components within normal limits  I-STAT TROPONIN, ED  CBG MONITORING, ED   ____________________________________________  EKG   EKG Interpretation  Date/Time:  Monday December 01 2016 16:40:59 EDT Ventricular Rate:  72 PR Interval:    QRS Duration: 95 QT Interval:  426 QTC Calculation: 463 R Axis:   68 Text Interpretation:  Sinus rhythm Low voltage, precordial leads Baseline wander in lead(s) I III aVL No STEMI.  Confirmed by Nanda Quinton [phone]) on 12/01/2016 4:44:35 PM        ____________________________________________  RADIOLOGY  Dg Chest 2 View  Result Date: 12/01/2016 CLINICAL DATA:  Tired and foggy EXAM: CHEST  2 VIEW COMPARISON:  12/06/2015 FINDINGS: No significant pleural effusion. Mild cardiomegaly with mild central congestion. No focal consolidation. No pneumothorax. IMPRESSION: 1. Borderline to mild cardiomegaly likely exaggerated by rotation. Suggestion of mild central vascular congestion 2. No focal infiltrate is seen Electronically Signed   By: Donavan Foil M.D.   On: 12/01/2016 18:11    ____________________________________________   PROCEDURES  Procedure(s) performed:   Procedures  None ____________________________________________   INITIAL IMPRESSION / ASSESSMENT AND PLAN / ED COURSE  Pertinent labs & imaging results that were available during my care of the patient were reviewed by me and considered in my medical decision making (see chart for details).  Patient presents to the emergency department for evaluation of hypoglycemia. She had prodrome symptoms consistent with hypoglycemic event. Very low suspicion for atypical ACS. No evidence of underlying infection. Patient has no concern that she took wrong amount of insulin which did happen to her two days prior. She feels like her fluid is increased especially in her abdominal wall. Plan for labs, serial blood sugars, and CXR.   06:51 PM She was observed in the emergency department for the last 3 hours. No hypoglycemic episodes here. Labs and chest x-ray are unremarkable. Plan for discharge home. She will call the primary care physician tomorrow to discuss her home insulin dosing.   At this time, I do not feel there is any life-threatening condition present. I have reviewed and discussed all results (EKG, imaging, lab, urine as appropriate), exam findings with patient. I have reviewed nursing notes and appropriate previous records.  I feel the patient is safe to be discharged home  without further emergent workup. Discussed usual and customary return precautions. Patient and family (if present) verbalize understanding and are comfortable with this plan.  Patient will follow-up with their primary care provider. If they do not have a primary care provider, information for follow-up has been provided to them. All questions have been answered.  ____________________________________________  FINAL CLINICAL IMPRESSION(S) / ED DIAGNOSES  Final diagnoses:  Hypoglycemia  Generalized weakness     MEDICATIONS GIVEN DURING THIS VISIT:  Medications - No data to display   NEW OUTPATIENT MEDICATIONS STARTED DURING THIS VISIT:  None   Note:  This document was prepared using Dragon voice recognition software and may include unintentional dictation errors.  Nanda Quinton, MD Emergency Medicine    Bricyn Labrada, [facility], MD 12/01/16 [phone]

## 2016-12-01 NOTE — Discharge Instructions (Signed)
As we discussed, your blood sugar dropped too low today.  We recommend that you eat small snacks and keep a very careful watch on your blood sugar.  Follow-up with your regular doctor at the next available opportunity.  Return to the emergency department with new or worsening symptoms that concern you.

## 2016-12-01 NOTE — ED Notes (Signed)
Pt alert & oriented x4, stable gait. Patient given discharge instructions, paperwork & prescription(s). Patient verbalized understanding. Pt left department in wheelchair escorted by staff. Pt left w/ no further questions. 

## 2016-12-01 NOTE — ED Triage Notes (Signed)
Pt called EMS for low blood sugar of 55 at 1500. Pt states she ate peanut butter and drank soda and blood sugar increased to 197, cbg-147 in route per EMS. Pt reports she feels "foggy and tired". No neuro deficits. nad noted.

## 2016-12-08 DIAGNOSIS — E1165 Type 2 diabetes mellitus with hyperglycemia: Secondary | ICD-10-CM | POA: Diagnosis not present

## 2016-12-19 DIAGNOSIS — E1040 Type 1 diabetes mellitus with diabetic neuropathy, unspecified: Secondary | ICD-10-CM | POA: Diagnosis not present

## 2016-12-19 DIAGNOSIS — M5416 Radiculopathy, lumbar region: Secondary | ICD-10-CM | POA: Diagnosis not present

## 2016-12-19 DIAGNOSIS — Z79891 Long term (current) use of opiate analgesic: Secondary | ICD-10-CM | POA: Diagnosis not present

## 2016-12-19 DIAGNOSIS — R269 Unspecified abnormalities of gait and mobility: Secondary | ICD-10-CM | POA: Diagnosis not present

## 2016-12-26 DIAGNOSIS — L98499 Non-pressure chronic ulcer of skin of other sites with unspecified severity: Secondary | ICD-10-CM | POA: Diagnosis not present

## 2016-12-31 DIAGNOSIS — J449 Chronic obstructive pulmonary disease, unspecified: Secondary | ICD-10-CM | POA: Diagnosis not present

## 2017-01-02 DIAGNOSIS — Z79891 Long term (current) use of opiate analgesic: Secondary | ICD-10-CM | POA: Diagnosis not present

## 2017-01-02 DIAGNOSIS — L98492 Non-pressure chronic ulcer of skin of other sites with fat layer exposed: Secondary | ICD-10-CM | POA: Diagnosis not present

## 2017-01-02 DIAGNOSIS — E1140 Type 2 diabetes mellitus with diabetic neuropathy, unspecified: Secondary | ICD-10-CM | POA: Diagnosis not present

## 2017-01-02 DIAGNOSIS — Z794 Long term (current) use of insulin: Secondary | ICD-10-CM | POA: Diagnosis not present

## 2017-01-02 DIAGNOSIS — I110 Hypertensive heart disease with heart failure: Secondary | ICD-10-CM | POA: Diagnosis not present

## 2017-01-02 DIAGNOSIS — Z9981 Dependence on supplemental oxygen: Secondary | ICD-10-CM | POA: Diagnosis not present

## 2017-01-02 DIAGNOSIS — E039 Hypothyroidism, unspecified: Secondary | ICD-10-CM | POA: Diagnosis not present

## 2017-01-02 DIAGNOSIS — I509 Heart failure, unspecified: Secondary | ICD-10-CM | POA: Diagnosis not present

## 2017-01-02 DIAGNOSIS — L98499 Non-pressure chronic ulcer of skin of other sites with unspecified severity: Secondary | ICD-10-CM | POA: Diagnosis not present

## 2017-01-02 DIAGNOSIS — Z9181 History of falling: Secondary | ICD-10-CM | POA: Diagnosis not present

## 2017-01-02 DIAGNOSIS — J449 Chronic obstructive pulmonary disease, unspecified: Secondary | ICD-10-CM | POA: Diagnosis not present

## 2017-01-03 DIAGNOSIS — Z9181 History of falling: Secondary | ICD-10-CM | POA: Diagnosis not present

## 2017-01-03 DIAGNOSIS — L98492 Non-pressure chronic ulcer of skin of other sites with fat layer exposed: Secondary | ICD-10-CM | POA: Diagnosis not present

## 2017-01-03 DIAGNOSIS — Z79891 Long term (current) use of opiate analgesic: Secondary | ICD-10-CM | POA: Diagnosis not present

## 2017-01-03 DIAGNOSIS — I509 Heart failure, unspecified: Secondary | ICD-10-CM | POA: Diagnosis not present

## 2017-01-03 DIAGNOSIS — J449 Chronic obstructive pulmonary disease, unspecified: Secondary | ICD-10-CM | POA: Diagnosis not present

## 2017-01-03 DIAGNOSIS — I110 Hypertensive heart disease with heart failure: Secondary | ICD-10-CM | POA: Diagnosis not present

## 2017-01-03 DIAGNOSIS — E1140 Type 2 diabetes mellitus with diabetic neuropathy, unspecified: Secondary | ICD-10-CM | POA: Diagnosis not present

## 2017-01-03 DIAGNOSIS — Z794 Long term (current) use of insulin: Secondary | ICD-10-CM | POA: Diagnosis not present

## 2017-01-03 DIAGNOSIS — E039 Hypothyroidism, unspecified: Secondary | ICD-10-CM | POA: Diagnosis not present

## 2017-01-03 DIAGNOSIS — Z9981 Dependence on supplemental oxygen: Secondary | ICD-10-CM | POA: Diagnosis not present

## 2017-01-04 DIAGNOSIS — Z79891 Long term (current) use of opiate analgesic: Secondary | ICD-10-CM | POA: Diagnosis not present

## 2017-01-04 DIAGNOSIS — E039 Hypothyroidism, unspecified: Secondary | ICD-10-CM | POA: Diagnosis not present

## 2017-01-04 DIAGNOSIS — J449 Chronic obstructive pulmonary disease, unspecified: Secondary | ICD-10-CM | POA: Diagnosis not present

## 2017-01-04 DIAGNOSIS — Z9981 Dependence on supplemental oxygen: Secondary | ICD-10-CM | POA: Diagnosis not present

## 2017-01-04 DIAGNOSIS — Z9181 History of falling: Secondary | ICD-10-CM | POA: Diagnosis not present

## 2017-01-04 DIAGNOSIS — E1140 Type 2 diabetes mellitus with diabetic neuropathy, unspecified: Secondary | ICD-10-CM | POA: Diagnosis not present

## 2017-01-04 DIAGNOSIS — I509 Heart failure, unspecified: Secondary | ICD-10-CM | POA: Diagnosis not present

## 2017-01-04 DIAGNOSIS — I110 Hypertensive heart disease with heart failure: Secondary | ICD-10-CM | POA: Diagnosis not present

## 2017-01-04 DIAGNOSIS — L98492 Non-pressure chronic ulcer of skin of other sites with fat layer exposed: Secondary | ICD-10-CM | POA: Diagnosis not present

## 2017-01-04 DIAGNOSIS — Z794 Long term (current) use of insulin: Secondary | ICD-10-CM | POA: Diagnosis not present

## 2017-01-05 DIAGNOSIS — Z9181 History of falling: Secondary | ICD-10-CM | POA: Diagnosis not present

## 2017-01-05 DIAGNOSIS — I110 Hypertensive heart disease with heart failure: Secondary | ICD-10-CM | POA: Diagnosis not present

## 2017-01-05 DIAGNOSIS — E039 Hypothyroidism, unspecified: Secondary | ICD-10-CM | POA: Diagnosis not present

## 2017-01-05 DIAGNOSIS — I509 Heart failure, unspecified: Secondary | ICD-10-CM | POA: Diagnosis not present

## 2017-01-05 DIAGNOSIS — E1140 Type 2 diabetes mellitus with diabetic neuropathy, unspecified: Secondary | ICD-10-CM | POA: Diagnosis not present

## 2017-01-05 DIAGNOSIS — J449 Chronic obstructive pulmonary disease, unspecified: Secondary | ICD-10-CM | POA: Diagnosis not present

## 2017-01-05 DIAGNOSIS — Z794 Long term (current) use of insulin: Secondary | ICD-10-CM | POA: Diagnosis not present

## 2017-01-05 DIAGNOSIS — L98492 Non-pressure chronic ulcer of skin of other sites with fat layer exposed: Secondary | ICD-10-CM | POA: Diagnosis not present

## 2017-01-05 DIAGNOSIS — Z9981 Dependence on supplemental oxygen: Secondary | ICD-10-CM | POA: Diagnosis not present

## 2017-01-05 DIAGNOSIS — Z79891 Long term (current) use of opiate analgesic: Secondary | ICD-10-CM | POA: Diagnosis not present

## 2017-01-06 DIAGNOSIS — Z9181 History of falling: Secondary | ICD-10-CM | POA: Diagnosis not present

## 2017-01-06 DIAGNOSIS — Z794 Long term (current) use of insulin: Secondary | ICD-10-CM | POA: Diagnosis not present

## 2017-01-06 DIAGNOSIS — J449 Chronic obstructive pulmonary disease, unspecified: Secondary | ICD-10-CM | POA: Diagnosis not present

## 2017-01-06 DIAGNOSIS — Z9981 Dependence on supplemental oxygen: Secondary | ICD-10-CM | POA: Diagnosis not present

## 2017-01-06 DIAGNOSIS — I509 Heart failure, unspecified: Secondary | ICD-10-CM | POA: Diagnosis not present

## 2017-01-06 DIAGNOSIS — Z79891 Long term (current) use of opiate analgesic: Secondary | ICD-10-CM | POA: Diagnosis not present

## 2017-01-06 DIAGNOSIS — E039 Hypothyroidism, unspecified: Secondary | ICD-10-CM | POA: Diagnosis not present

## 2017-01-06 DIAGNOSIS — E1140 Type 2 diabetes mellitus with diabetic neuropathy, unspecified: Secondary | ICD-10-CM | POA: Diagnosis not present

## 2017-01-06 DIAGNOSIS — I110 Hypertensive heart disease with heart failure: Secondary | ICD-10-CM | POA: Diagnosis not present

## 2017-01-06 DIAGNOSIS — L98492 Non-pressure chronic ulcer of skin of other sites with fat layer exposed: Secondary | ICD-10-CM | POA: Diagnosis not present

## 2017-01-06 DIAGNOSIS — L98499 Non-pressure chronic ulcer of skin of other sites with unspecified severity: Secondary | ICD-10-CM | POA: Diagnosis not present

## 2017-01-07 DIAGNOSIS — E039 Hypothyroidism, unspecified: Secondary | ICD-10-CM | POA: Diagnosis not present

## 2017-01-07 DIAGNOSIS — Z9181 History of falling: Secondary | ICD-10-CM | POA: Diagnosis not present

## 2017-01-07 DIAGNOSIS — I509 Heart failure, unspecified: Secondary | ICD-10-CM | POA: Diagnosis not present

## 2017-01-07 DIAGNOSIS — I110 Hypertensive heart disease with heart failure: Secondary | ICD-10-CM | POA: Diagnosis not present

## 2017-01-07 DIAGNOSIS — L98492 Non-pressure chronic ulcer of skin of other sites with fat layer exposed: Secondary | ICD-10-CM | POA: Diagnosis not present

## 2017-01-07 DIAGNOSIS — E1140 Type 2 diabetes mellitus with diabetic neuropathy, unspecified: Secondary | ICD-10-CM | POA: Diagnosis not present

## 2017-01-07 DIAGNOSIS — J449 Chronic obstructive pulmonary disease, unspecified: Secondary | ICD-10-CM | POA: Diagnosis not present

## 2017-01-07 DIAGNOSIS — Z79891 Long term (current) use of opiate analgesic: Secondary | ICD-10-CM | POA: Diagnosis not present

## 2017-01-07 DIAGNOSIS — Z9981 Dependence on supplemental oxygen: Secondary | ICD-10-CM | POA: Diagnosis not present

## 2017-01-07 DIAGNOSIS — Z794 Long term (current) use of insulin: Secondary | ICD-10-CM | POA: Diagnosis not present

## 2017-01-08 DIAGNOSIS — Z794 Long term (current) use of insulin: Secondary | ICD-10-CM | POA: Diagnosis not present

## 2017-01-08 DIAGNOSIS — J449 Chronic obstructive pulmonary disease, unspecified: Secondary | ICD-10-CM | POA: Diagnosis not present

## 2017-01-08 DIAGNOSIS — E039 Hypothyroidism, unspecified: Secondary | ICD-10-CM | POA: Diagnosis not present

## 2017-01-08 DIAGNOSIS — I110 Hypertensive heart disease with heart failure: Secondary | ICD-10-CM | POA: Diagnosis not present

## 2017-01-08 DIAGNOSIS — I509 Heart failure, unspecified: Secondary | ICD-10-CM | POA: Diagnosis not present

## 2017-01-08 DIAGNOSIS — Z9981 Dependence on supplemental oxygen: Secondary | ICD-10-CM | POA: Diagnosis not present

## 2017-01-08 DIAGNOSIS — E1140 Type 2 diabetes mellitus with diabetic neuropathy, unspecified: Secondary | ICD-10-CM | POA: Diagnosis not present

## 2017-01-08 DIAGNOSIS — L98492 Non-pressure chronic ulcer of skin of other sites with fat layer exposed: Secondary | ICD-10-CM | POA: Diagnosis not present

## 2017-01-08 DIAGNOSIS — Z9181 History of falling: Secondary | ICD-10-CM | POA: Diagnosis not present

## 2017-01-08 DIAGNOSIS — Z79891 Long term (current) use of opiate analgesic: Secondary | ICD-10-CM | POA: Diagnosis not present

## 2017-01-09 DIAGNOSIS — Z794 Long term (current) use of insulin: Secondary | ICD-10-CM | POA: Diagnosis not present

## 2017-01-09 DIAGNOSIS — Z9981 Dependence on supplemental oxygen: Secondary | ICD-10-CM | POA: Diagnosis not present

## 2017-01-09 DIAGNOSIS — Z79891 Long term (current) use of opiate analgesic: Secondary | ICD-10-CM | POA: Diagnosis not present

## 2017-01-09 DIAGNOSIS — I509 Heart failure, unspecified: Secondary | ICD-10-CM | POA: Diagnosis not present

## 2017-01-09 DIAGNOSIS — I110 Hypertensive heart disease with heart failure: Secondary | ICD-10-CM | POA: Diagnosis not present

## 2017-01-09 DIAGNOSIS — J449 Chronic obstructive pulmonary disease, unspecified: Secondary | ICD-10-CM | POA: Diagnosis not present

## 2017-01-09 DIAGNOSIS — Z9181 History of falling: Secondary | ICD-10-CM | POA: Diagnosis not present

## 2017-01-09 DIAGNOSIS — E039 Hypothyroidism, unspecified: Secondary | ICD-10-CM | POA: Diagnosis not present

## 2017-01-09 DIAGNOSIS — L98492 Non-pressure chronic ulcer of skin of other sites with fat layer exposed: Secondary | ICD-10-CM | POA: Diagnosis not present

## 2017-01-09 DIAGNOSIS — E1140 Type 2 diabetes mellitus with diabetic neuropathy, unspecified: Secondary | ICD-10-CM | POA: Diagnosis not present

## 2017-01-10 DIAGNOSIS — Z9181 History of falling: Secondary | ICD-10-CM | POA: Diagnosis not present

## 2017-01-10 DIAGNOSIS — Z794 Long term (current) use of insulin: Secondary | ICD-10-CM | POA: Diagnosis not present

## 2017-01-10 DIAGNOSIS — L98492 Non-pressure chronic ulcer of skin of other sites with fat layer exposed: Secondary | ICD-10-CM | POA: Diagnosis not present

## 2017-01-10 DIAGNOSIS — I509 Heart failure, unspecified: Secondary | ICD-10-CM | POA: Diagnosis not present

## 2017-01-10 DIAGNOSIS — J449 Chronic obstructive pulmonary disease, unspecified: Secondary | ICD-10-CM | POA: Diagnosis not present

## 2017-01-10 DIAGNOSIS — E1140 Type 2 diabetes mellitus with diabetic neuropathy, unspecified: Secondary | ICD-10-CM | POA: Diagnosis not present

## 2017-01-10 DIAGNOSIS — Z79891 Long term (current) use of opiate analgesic: Secondary | ICD-10-CM | POA: Diagnosis not present

## 2017-01-10 DIAGNOSIS — Z9981 Dependence on supplemental oxygen: Secondary | ICD-10-CM | POA: Diagnosis not present

## 2017-01-10 DIAGNOSIS — E039 Hypothyroidism, unspecified: Secondary | ICD-10-CM | POA: Diagnosis not present

## 2017-01-10 DIAGNOSIS — I110 Hypertensive heart disease with heart failure: Secondary | ICD-10-CM | POA: Diagnosis not present

## 2017-01-10 DIAGNOSIS — L98499 Non-pressure chronic ulcer of skin of other sites with unspecified severity: Secondary | ICD-10-CM | POA: Diagnosis not present

## 2017-01-11 DIAGNOSIS — Z9981 Dependence on supplemental oxygen: Secondary | ICD-10-CM | POA: Diagnosis not present

## 2017-01-11 DIAGNOSIS — Z9181 History of falling: Secondary | ICD-10-CM | POA: Diagnosis not present

## 2017-01-11 DIAGNOSIS — E039 Hypothyroidism, unspecified: Secondary | ICD-10-CM | POA: Diagnosis not present

## 2017-01-11 DIAGNOSIS — J449 Chronic obstructive pulmonary disease, unspecified: Secondary | ICD-10-CM | POA: Diagnosis not present

## 2017-01-11 DIAGNOSIS — Z79891 Long term (current) use of opiate analgesic: Secondary | ICD-10-CM | POA: Diagnosis not present

## 2017-01-11 DIAGNOSIS — I509 Heart failure, unspecified: Secondary | ICD-10-CM | POA: Diagnosis not present

## 2017-01-11 DIAGNOSIS — Z794 Long term (current) use of insulin: Secondary | ICD-10-CM | POA: Diagnosis not present

## 2017-01-11 DIAGNOSIS — I110 Hypertensive heart disease with heart failure: Secondary | ICD-10-CM | POA: Diagnosis not present

## 2017-01-11 DIAGNOSIS — E1140 Type 2 diabetes mellitus with diabetic neuropathy, unspecified: Secondary | ICD-10-CM | POA: Diagnosis not present

## 2017-01-11 DIAGNOSIS — L98492 Non-pressure chronic ulcer of skin of other sites with fat layer exposed: Secondary | ICD-10-CM | POA: Diagnosis not present

## 2017-01-13 DIAGNOSIS — L98492 Non-pressure chronic ulcer of skin of other sites with fat layer exposed: Secondary | ICD-10-CM | POA: Diagnosis not present

## 2017-01-13 DIAGNOSIS — Z9181 History of falling: Secondary | ICD-10-CM | POA: Diagnosis not present

## 2017-01-13 DIAGNOSIS — I509 Heart failure, unspecified: Secondary | ICD-10-CM | POA: Diagnosis not present

## 2017-01-13 DIAGNOSIS — Z9981 Dependence on supplemental oxygen: Secondary | ICD-10-CM | POA: Diagnosis not present

## 2017-01-13 DIAGNOSIS — E039 Hypothyroidism, unspecified: Secondary | ICD-10-CM | POA: Diagnosis not present

## 2017-01-13 DIAGNOSIS — J449 Chronic obstructive pulmonary disease, unspecified: Secondary | ICD-10-CM | POA: Diagnosis not present

## 2017-01-13 DIAGNOSIS — E1140 Type 2 diabetes mellitus with diabetic neuropathy, unspecified: Secondary | ICD-10-CM | POA: Diagnosis not present

## 2017-01-13 DIAGNOSIS — Z794 Long term (current) use of insulin: Secondary | ICD-10-CM | POA: Diagnosis not present

## 2017-01-13 DIAGNOSIS — I110 Hypertensive heart disease with heart failure: Secondary | ICD-10-CM | POA: Diagnosis not present

## 2017-01-13 DIAGNOSIS — Z79891 Long term (current) use of opiate analgesic: Secondary | ICD-10-CM | POA: Diagnosis not present

## 2017-01-15 DIAGNOSIS — E039 Hypothyroidism, unspecified: Secondary | ICD-10-CM | POA: Diagnosis not present

## 2017-01-15 DIAGNOSIS — L98492 Non-pressure chronic ulcer of skin of other sites with fat layer exposed: Secondary | ICD-10-CM | POA: Diagnosis not present

## 2017-01-15 DIAGNOSIS — Z9181 History of falling: Secondary | ICD-10-CM | POA: Diagnosis not present

## 2017-01-15 DIAGNOSIS — J449 Chronic obstructive pulmonary disease, unspecified: Secondary | ICD-10-CM | POA: Diagnosis not present

## 2017-01-15 DIAGNOSIS — Z79891 Long term (current) use of opiate analgesic: Secondary | ICD-10-CM | POA: Diagnosis not present

## 2017-01-15 DIAGNOSIS — I509 Heart failure, unspecified: Secondary | ICD-10-CM | POA: Diagnosis not present

## 2017-01-15 DIAGNOSIS — Z9981 Dependence on supplemental oxygen: Secondary | ICD-10-CM | POA: Diagnosis not present

## 2017-01-15 DIAGNOSIS — I110 Hypertensive heart disease with heart failure: Secondary | ICD-10-CM | POA: Diagnosis not present

## 2017-01-15 DIAGNOSIS — E1140 Type 2 diabetes mellitus with diabetic neuropathy, unspecified: Secondary | ICD-10-CM | POA: Diagnosis not present

## 2017-01-15 DIAGNOSIS — Z794 Long term (current) use of insulin: Secondary | ICD-10-CM | POA: Diagnosis not present

## 2017-01-20 DIAGNOSIS — Z9181 History of falling: Secondary | ICD-10-CM | POA: Diagnosis not present

## 2017-01-20 DIAGNOSIS — Z794 Long term (current) use of insulin: Secondary | ICD-10-CM | POA: Diagnosis not present

## 2017-01-20 DIAGNOSIS — I509 Heart failure, unspecified: Secondary | ICD-10-CM | POA: Diagnosis not present

## 2017-01-20 DIAGNOSIS — E039 Hypothyroidism, unspecified: Secondary | ICD-10-CM | POA: Diagnosis not present

## 2017-01-20 DIAGNOSIS — I110 Hypertensive heart disease with heart failure: Secondary | ICD-10-CM | POA: Diagnosis not present

## 2017-01-20 DIAGNOSIS — L98492 Non-pressure chronic ulcer of skin of other sites with fat layer exposed: Secondary | ICD-10-CM | POA: Diagnosis not present

## 2017-01-20 DIAGNOSIS — E1140 Type 2 diabetes mellitus with diabetic neuropathy, unspecified: Secondary | ICD-10-CM | POA: Diagnosis not present

## 2017-01-20 DIAGNOSIS — Z79891 Long term (current) use of opiate analgesic: Secondary | ICD-10-CM | POA: Diagnosis not present

## 2017-01-20 DIAGNOSIS — J449 Chronic obstructive pulmonary disease, unspecified: Secondary | ICD-10-CM | POA: Diagnosis not present

## 2017-01-20 DIAGNOSIS — Z9981 Dependence on supplemental oxygen: Secondary | ICD-10-CM | POA: Diagnosis not present

## 2017-01-22 DIAGNOSIS — Z9181 History of falling: Secondary | ICD-10-CM | POA: Diagnosis not present

## 2017-01-22 DIAGNOSIS — E039 Hypothyroidism, unspecified: Secondary | ICD-10-CM | POA: Diagnosis not present

## 2017-01-22 DIAGNOSIS — Z79891 Long term (current) use of opiate analgesic: Secondary | ICD-10-CM | POA: Diagnosis not present

## 2017-01-22 DIAGNOSIS — Z9981 Dependence on supplemental oxygen: Secondary | ICD-10-CM | POA: Diagnosis not present

## 2017-01-22 DIAGNOSIS — E1040 Type 1 diabetes mellitus with diabetic neuropathy, unspecified: Secondary | ICD-10-CM | POA: Diagnosis not present

## 2017-01-22 DIAGNOSIS — I509 Heart failure, unspecified: Secondary | ICD-10-CM | POA: Diagnosis not present

## 2017-01-22 DIAGNOSIS — J449 Chronic obstructive pulmonary disease, unspecified: Secondary | ICD-10-CM | POA: Diagnosis not present

## 2017-01-22 DIAGNOSIS — E1140 Type 2 diabetes mellitus with diabetic neuropathy, unspecified: Secondary | ICD-10-CM | POA: Diagnosis not present

## 2017-01-22 DIAGNOSIS — L98492 Non-pressure chronic ulcer of skin of other sites with fat layer exposed: Secondary | ICD-10-CM | POA: Diagnosis not present

## 2017-01-22 DIAGNOSIS — I110 Hypertensive heart disease with heart failure: Secondary | ICD-10-CM | POA: Diagnosis not present

## 2017-01-22 DIAGNOSIS — Z794 Long term (current) use of insulin: Secondary | ICD-10-CM | POA: Diagnosis not present

## 2017-01-22 DIAGNOSIS — M545 Low back pain: Secondary | ICD-10-CM | POA: Diagnosis not present

## 2017-01-22 DIAGNOSIS — R269 Unspecified abnormalities of gait and mobility: Secondary | ICD-10-CM | POA: Diagnosis not present

## 2017-01-26 ENCOUNTER — Encounter (HOSPITAL_COMMUNITY): Payer: Self-pay | Admitting: *Deleted

## 2017-01-26 ENCOUNTER — Emergency Department (HOSPITAL_COMMUNITY)
Admission: EM | Admit: 2017-01-26 | Discharge: 2017-01-27 | Disposition: A | Discharge status: Home / Self Care | Payer: Medicare Other | Attending: Emergency Medicine | Admitting: Emergency Medicine

## 2017-01-26 DIAGNOSIS — N183 Chronic kidney disease, stage 3 (moderate): Secondary | ICD-10-CM | POA: Diagnosis not present

## 2017-01-26 DIAGNOSIS — Z794 Long term (current) use of insulin: Secondary | ICD-10-CM | POA: Diagnosis not present

## 2017-01-26 DIAGNOSIS — Z79899 Other long term (current) drug therapy: Secondary | ICD-10-CM | POA: Insufficient documentation

## 2017-01-26 DIAGNOSIS — I5032 Chronic diastolic (congestive) heart failure: Secondary | ICD-10-CM | POA: Insufficient documentation

## 2017-01-26 DIAGNOSIS — E1122 Type 2 diabetes mellitus with diabetic chronic kidney disease: Secondary | ICD-10-CM | POA: Insufficient documentation

## 2017-01-26 DIAGNOSIS — I130 Hypertensive heart and chronic kidney disease with heart failure and stage 1 through stage 4 chronic kidney disease, or unspecified chronic kidney disease: Secondary | ICD-10-CM | POA: Insufficient documentation

## 2017-01-26 DIAGNOSIS — R0789 Other chest pain: Secondary | ICD-10-CM | POA: Diagnosis not present

## 2017-01-26 DIAGNOSIS — J449 Chronic obstructive pulmonary disease, unspecified: Secondary | ICD-10-CM | POA: Diagnosis not present

## 2017-01-26 DIAGNOSIS — D649 Anemia, unspecified: Secondary | ICD-10-CM | POA: Diagnosis not present

## 2017-01-26 DIAGNOSIS — Z87891 Personal history of nicotine dependence: Secondary | ICD-10-CM | POA: Diagnosis not present

## 2017-01-26 DIAGNOSIS — E039 Hypothyroidism, unspecified: Secondary | ICD-10-CM | POA: Diagnosis not present

## 2017-01-26 DIAGNOSIS — I5033 Acute on chronic diastolic (congestive) heart failure: Secondary | ICD-10-CM

## 2017-01-26 DIAGNOSIS — R079 Chest pain, unspecified: Secondary | ICD-10-CM | POA: Diagnosis not present

## 2017-01-26 DIAGNOSIS — Z7982 Long term (current) use of aspirin: Secondary | ICD-10-CM | POA: Diagnosis not present

## 2017-01-26 DIAGNOSIS — N289 Disorder of kidney and ureter, unspecified: Secondary | ICD-10-CM

## 2017-01-26 DIAGNOSIS — R7989 Other specified abnormal findings of blood chemistry: Secondary | ICD-10-CM | POA: Diagnosis not present

## 2017-01-26 NOTE — ED Triage Notes (Signed)
Pt c/o chest pain that started 3 days ago, migraine headache, open skin areas to lower abd folds.

## 2017-01-27 ENCOUNTER — Emergency Department (HOSPITAL_COMMUNITY): Payer: Medicare Other

## 2017-01-27 ENCOUNTER — Encounter (HOSPITAL_COMMUNITY): Payer: Self-pay

## 2017-01-27 DIAGNOSIS — R079 Chest pain, unspecified: Secondary | ICD-10-CM | POA: Diagnosis not present

## 2017-01-27 DIAGNOSIS — R7989 Other specified abnormal findings of blood chemistry: Secondary | ICD-10-CM | POA: Diagnosis not present

## 2017-01-27 LAB — CBC WITH DIFFERENTIAL/PLATELET
Basophils Absolute: 0.1 10*3/uL (ref 0.0–0.1)
Basophils Relative: 1 %
Eosinophils Absolute: 0.2 10*3/uL (ref 0.0–0.7)
Eosinophils Relative: 2 %
HCT: 35.3 % — ABNORMAL LOW (ref 36.0–46.0)
Hemoglobin: 10.9 g/dL — ABNORMAL LOW (ref 12.0–15.0)
Lymphocytes Relative: 23 %
Lymphs Abs: 1.9 10*3/uL (ref 0.7–4.0)
MCH: 26.4 pg (ref 26.0–34.0)
MCHC: 30.9 g/dL (ref 30.0–36.0)
MCV: 85.5 fL (ref 78.0–100.0)
Monocytes Absolute: 0.9 10*3/uL (ref 0.1–1.0)
Monocytes Relative: 11 %
Neutro Abs: 5.1 10*3/uL (ref 1.7–7.7)
Neutrophils Relative %: 63 %
Platelets: 333 10*3/uL (ref 150–400)
RBC: 4.13 MIL/uL (ref 3.87–5.11)
RDW: 15.7 % — ABNORMAL HIGH (ref 11.5–15.5)
WBC: 8 10*3/uL (ref 4.0–10.5)

## 2017-01-27 LAB — COMPREHENSIVE METABOLIC PANEL
ALT: 13 U/L — ABNORMAL LOW (ref 14–54)
AST: 15 U/L (ref 15–41)
Albumin: 3.7 g/dL (ref 3.5–5.0)
Alkaline Phosphatase: 93 U/L (ref 38–126)
Anion gap: 10 (ref 5–15)
BUN: 25 mg/dL — ABNORMAL HIGH (ref 6–20)
CO2: 27 mmol/L (ref 22–32)
Calcium: 9.1 mg/dL (ref 8.9–10.3)
Chloride: 101 mmol/L (ref 101–111)
Creatinine, Ser: 1.09 mg/dL — ABNORMAL HIGH (ref 0.44–1.00)
GFR calc Af Amer: 59 mL/min — ABNORMAL LOW (ref >60)
GFR calc non Af Amer: 51 mL/min — ABNORMAL LOW (ref >60)
Glucose, Bld: 148 mg/dL — ABNORMAL HIGH (ref 65–99)
Potassium: 3.7 mmol/L (ref 3.5–5.1)
Sodium: 138 mmol/L (ref 135–145)
Total Bilirubin: 0.5 mg/dL (ref 0.3–1.2)
Total Protein: 7.2 g/dL (ref 6.5–8.1)

## 2017-01-27 LAB — TROPONIN I: Troponin I: <0.03 ng/mL (ref <0.03)

## 2017-01-27 LAB — D-DIMER, QUANTITATIVE: D-Dimer, Quant: 1.74 ug/mL-FEU — ABNORMAL HIGH (ref 0.00–0.50)

## 2017-01-27 LAB — CBG MONITORING, ED: Glucose-Capillary: 136 mg/dL — ABNORMAL HIGH (ref 65–99)

## 2017-01-27 LAB — BRAIN NATRIURETIC PEPTIDE: B Natriuretic Peptide: 125 pg/mL — ABNORMAL HIGH (ref 0.0–100.0)

## 2017-01-27 MED ORDER — FUROSEMIDE 10 MG/ML IJ SOLN
80.0000 mg | Freq: Once | INTRAMUSCULAR | Status: AC
  Administered 2017-01-27: 80 mg via INTRAVENOUS
  Filled 2017-01-27: qty 8

## 2017-01-27 MED ORDER — IOPAMIDOL (ISOVUE-370) INJECTION 76%
100.0000 mL | Freq: Once | INTRAVENOUS | Status: AC | PRN
  Administered 2017-01-27: 100 mL via INTRAVENOUS

## 2017-01-27 MED ORDER — ASPIRIN 81 MG PO CHEW
324.0000 mg | CHEWABLE_TABLET | Freq: Once | ORAL | Status: DC
  Filled 2017-01-27: qty 4

## 2017-01-27 MED ORDER — OXYCODONE-ACETAMINOPHEN 5-325 MG PO TABS
2.0000 | ORAL_TABLET | Freq: Once | ORAL | Status: AC
  Administered 2017-01-27: 2 via ORAL
  Filled 2017-01-27: qty 2

## 2017-01-27 MED ORDER — ALPRAZOLAM 0.5 MG PO TABS
2.0000 mg | ORAL_TABLET | Freq: Once | ORAL | Status: AC
  Administered 2017-01-27: 2 mg via ORAL
  Filled 2017-01-27: qty 4

## 2017-01-27 NOTE — Discharge Instructions (Signed)
Increase your furosemide (Lasix) to 80 mg twice a day. If you fail to get fluid off with this regimen, then we may need to consider admitting you to the hospital. Return if symptoms are getting worse.

## 2017-01-27 NOTE — ED Notes (Signed)
Pt refusing to be discharged.  States she is supposed to be admitted today and wants to get in touch with her doctor first.  States she does not have transportation home.

## 2017-01-27 NOTE — ED Notes (Signed)
Pt continues to states that her doctor is going to call.  Dr. Lita Mains coming to talk with pt.

## 2017-01-27 NOTE — ED Notes (Addendum)
20 gauge in right ac that was placed by ems was removed due to pain,  Cath intact, site wnl

## 2017-01-27 NOTE — ED Provider Notes (Signed)
[facility] DEPT Provider Note   CSN: [id number] Arrival date & time: 01/26/17  2349     History   Chief Complaint Chief Complaint  Patient presents with  . Chest Pain    HPI Stephanie Combs is a 66 y.o. female.  The history is provided by the patient.  Patient has a very complex history and is a very colorful historian. She came in because of chest pain and dyspnea. This is been going on for several days. She had been getting relief with nitroglycerin, but is not getting relief today. She describes a heavy feeling in her chest. There is no cough. She has had some swelling in her legs. She has chronic lymphedema, but feels that it is worse. She also states that her abdomen is more swollen than normal. She has chronic sores on her abdomen which have been treated by home health nurse. She denies fever or chills. She also has chronic headache which is unchanged from baseline. She states that she was told that she would have to come in the hospital to get fluid drained off of her abdomen.  Past Medical History:  Diagnosis Date  . Anxiety   . CHF (congestive heart failure) (HCC)    Diastolic  . Chronic back pain   . COPD (chronic obstructive pulmonary disease) ([place])   . Degenerative disk disease   . Diabetes mellitus without complication ([place])   . Hypertension   . Hypothyroidism   . On home O2    2.5 L N/C prn  . Pedal edema     Patient Active Problem List   Diagnosis Date Noted  . Tinea cruris 10/10/2016  . Vitamin D deficiency 08/19/2016  . Personal history of noncompliance with medical treatment, presenting hazards to health 06/17/2016  . Left hand weakness 12/06/2015  . CAP (community acquired pneumonia) 07/25/2015  . Pressure ulcer 05/02/2015  . Hyponatremia 05/01/2015  . Acute-on-chronic kidney injury ([place]) 05/01/2015  . Uncontrolled type 2 diabetes mellitus with stage 3 chronic kidney disease ([place]) 05/01/2015  . COPD exacerbation ([place]) 04/30/2015  . Cellulitis of  foot, right 03/24/2015  . Elevated d-dimer 03/24/2015  . Peripheral edema 12/04/2014  . Bilateral edema of lower extremity   . Diabetic ulcer of right great toe ([place])   . Foot ulcer due to secondary DM ([place]) 11/07/2014  . Diabetic ulcer of right foot ([place]) 11/07/2014  . Edema of lower extremity 05/27/2013  . Headache 05/26/2013  . CKD (chronic kidney disease), stage III 04/14/2013  . Leukocytosis 04/14/2013  . Anemia in CKD (chronic kidney disease) 04/14/2013  . Dyspnea 04/14/2013  . Chronic diastolic CHF (congestive heart failure) ([place])   . Hypothyroidism   . COPD (chronic obstructive pulmonary disease) ([place]) 08/07/2012  . Morbid obesity ([place]) 08/07/2012  . DM type 2 (diabetes mellitus, type 2) ([place]) 08/07/2012  . HTN (hypertension), benign 08/07/2012  . Hypothyroidism (acquired) 08/07/2012    Past Surgical History:  Procedure Laterality Date  . HERNIA REPAIR    . TUBAL LIGATION      OB History    No data available       Home Medications    Prior to Admission medications   Medication Sig Start Date End Date Taking? Authorizing Provider  silver sulfADIAZINE (SILVADENE) 1 % cream Apply 1 application topically daily.   Yes [provider]  albuterol (PROAIR HFA) 108 (90 BASE) MCG/ACT inhaler Inhale 1 puff into the lungs every 4 (four) hours as needed. For shortness of breath Patient  taking differently: Inhale 2 puffs into the lungs every 4 (four) hours as needed. For shortness of breath 05/30/13   Kinnie Feil, MD  alprazolam Duanne Moron) 2 MG tablet Take 2 mg by mouth 3 (three) times daily as needed for sleep or anxiety.    [provider]  aspirin EC 81 MG tablet Take 81 mg by mouth daily.    [provider]  Cholecalciferol (VITAMIN D3) 5000 units CAPS Take 1 capsule (5,000 Units total) by mouth daily. 08/19/16   Cassandria Anger, MD  diltiazem (CARDIZEM CD) 300 MG 24 hr capsule Take 300 mg by mouth daily.    [provider]    furosemide (LASIX) 20 MG tablet Take 2 tablets (40 mg total) by mouth 2 (two) times daily. 12/06/14   Isaac Bliss, Rayford Halsted, MD  gabapentin (NEURONTIN) 400 MG capsule Take 3 capsules (1,200 mg total) by mouth 2 (two) times daily. Patient taking differently: Take 1,600 mg by mouth 2 (two) times daily.  05/02/15   Orvan Falconer, MD  insulin detemir (LEVEMIR) 100 UNIT/ML injection Inject 0.5 mLs (50 Units total) into the skin at bedtime. 11/27/16   Cassandria Anger, MD  insulin lispro (HUMALOG) 100 UNIT/ML injection Inject 0.1-0.16 mLs (10-16 Units total) into the skin 3 (three) times daily before meals. Patient taking differently: Inject 15-20 Units into the skin 3 (three) times daily before meals.  11/27/16   Cassandria Anger, MD  INSULIN SYRINGE .5CC/29G 29G X 1/2" 0.5 ML MISC Use to inject insulin 4 times a day. 11/27/16   Cassandria Anger, MD  levothyroxine (SYNTHROID, LEVOTHROID) 100 MCG tablet Take 1 tablet (100 mcg total) by mouth daily before breakfast. Patient taking differently: Take 244 mcg by mouth daily before breakfast.  08/19/16   Nida, Marella Chimes, MD  lidocaine-prilocaine (EMLA) cream Apply 1 application topically as needed. 10/10/16   Eber Jones, MD  Liraglutide (VICTOZA [place]) Inject 1.2 mg into the skin daily after breakfast.    [provider]  nystatin (MYCOSTATIN/NYSTOP) powder Apply topically 2 (two) times daily. 10/10/16   Eber Jones, MD  Omega-3 Fatty Acids (FISH OIL PO) Take 1 capsule by mouth daily.     [provider]  ondansetron (ZOFRAN ODT) 8 MG disintegrating tablet Take 1 tablet (8 mg total) by mouth every 8 (eight) hours as needed for nausea or vomiting. Patient taking differently: Take 8 mg by mouth daily.  12/31/13   Jola Schmidt, MD  oxyCODONE-acetaminophen (PERCOCET) 10-325 MG tablet Take 1 tablet by mouth every 6 (six) hours as needed for pain.    [provider]  potassium chloride (K-DUR) 10 MEQ tablet  Take 20 mEq by mouth 2 (two) times daily.     [provider]  ranitidine (ZANTAC) 150 MG tablet Take 150 mg by mouth daily.    [provider]    Family History Family History  Problem Relation Age of Onset  . Stroke Mother   . Heart attack Father   . Diabetes Brother   . Cerebral palsy Brother   . Pneumonia Brother   . Diabetes Other   . Heart attack Other     Social History Social History  Substance Use Topics  . Smoking status: Former Smoker    Packs/day: 0.25    Types: Cigarettes    Quit date: 06/27/2015  . Smokeless tobacco: Never Used  . Alcohol use No     Allergies   Dilaudid [hydromorphone hcl]; Actifed cold-allergy [  chlorpheniramine-phenylephrine]; Doxycycline; Other; Penicillins; Reglan [metoclopramide]; Valium [diazepam]; and Vistaril [hydroxyzine hcl]   Review of Systems Review of Systems  All other systems reviewed and are negative.    Physical Exam Updated Vital Signs BP (!) 158/74 (BP Location: Left Wrist)   Pulse 79   Temp 98 F (36.7 C) (Oral)   Resp (!) 22   Ht 5' 6.5" (1.689 m)   Wt (!) 141.1 kg (311 lb)   SpO2 100%   BMI 49.44 kg/m   Physical Exam  Nursing note and vitals reviewed.  Morbidly obese 66 year old female, resting comfortably and in no acute distress. Vital signs are significant for hypertension and tachypnea. Oxygen saturation is 100%, which is normal. Head is normocephalic and atraumatic. PERRLA, EOMI. Oropharynx is clear. Neck is nontender and supple without adenopathy or JVD. Back is nontender and there is no CVA tenderness. Lungs have bibasilar rales without wheezes or rhonchi. Chest is nontender. Heart has regular rate and rhythm without murmur. Abdomen is soft, flat, nontender without masses or hepatosplenomegaly and peristalsis is normoactive. There is a large pannus with ulcerations to the lateral sides bilaterally. These do not have erythema or drainage and did not appear grossly  infected. Extremities have 2+ lymphedema and trace to 1+ pitting edema. Skin is warm and dry without rash. Neurologic: Mental status is normal, cranial nerves are intact, there are no motor or sensory deficits.  ED Treatments / Results  Labs (all labs ordered are listed, but only abnormal results are displayed) Labs Reviewed  COMPREHENSIVE METABOLIC PANEL - Abnormal; Notable for the following:       Result Value   Glucose, Bld 148 (*)    BUN 25 (*)    Creatinine, Ser 1.09 (*)    ALT 13 (*)    GFR calc non Af Amer 51 (*)    GFR calc Af Amer 59 (*)    All other components within normal limits  CBC WITH DIFFERENTIAL/PLATELET - Abnormal; Notable for the following:    Hemoglobin 10.9 (*)    HCT 35.3 (*)    RDW 15.7 (*)    All other components within normal limits  BRAIN NATRIURETIC PEPTIDE - Abnormal; Notable for the following:    B Natriuretic Peptide 125.0 (*)    All other components within normal limits  D-DIMER, QUANTITATIVE (NOT AT [facility]) - Abnormal; Notable for the following:    D-Dimer, Quant 1.74 (*)    All other components within normal limits  CBG MONITORING, ED - Abnormal; Notable for the following:    Glucose-Capillary 136 (*)    All other components within normal limits  TROPONIN I    EKG  EKG Interpretation  Date/Time:  Monday January 26 2017 23:54:58 EDT Ventricular Rate:  83 PR Interval:    QRS Duration: 106 QT Interval:  382 QTC Calculation: 449 R Axis:   80 Text Interpretation:  Sinus rhythm Ventricular premature complex Borderline low voltage, extremity leads Nonspecific T abnormalities, lateral leads When compared with ECG of 12/01/2016, No significant change was found Confirmed by Delora Fuel ([id number]) on 01/27/2017 12:07:01 AM       Radiology Ct Angio Chest Pe W And/or Wo Contrast  Result Date: 01/27/2017 CLINICAL DATA:  Chest pain starting 3 days ago. Headaches. Elevated D-dimer. Intermediate clinical probability for pulmonary embolus. EXAM: CT  ANGIOGRAPHY CHEST WITH CONTRAST TECHNIQUE: Multidetector CT imaging of the chest was performed using the standard protocol during bolus administration of intravenous contrast. Multiplanar CT image reconstructions and MIPs  were obtained to evaluate the vascular anatomy. CONTRAST:  100 mL Isovue 370 COMPARISON:  None. FINDINGS: Cardiovascular: Satisfactory opacification of the pulmonary arteries to the segmental level. No evidence of pulmonary embolism. Normal heart size. No pericardial effusion. Normal caliber thoracic aorta. No evidence of aortic dissection. Great vessel origins are patent. Scattered calcifications in the aorta and coronary arteries. Mediastinum/Nodes: No enlarged mediastinal, hilar, or axillary lymph nodes. Thyroid gland, trachea, and esophagus demonstrate no significant findings. Lungs/Pleura: Lungs are clear. No pleural effusion or pneumothorax. Upper Abdomen: 2 cm diameter left adrenal gland nodule. Fat attenuation measurements consistent with benign adenoma. Musculoskeletal: Degenerative changes in the spine. No destructive bone lesions. Review of the MIP images confirms the above findings. IMPRESSION: 1. No evidence of significant pulmonary embolus. 2. No evidence of active pulmonary disease. 3. Aortic atherosclerosis. 4. Benign-appearing left adrenal gland nodule likely representing an adenoma. Aortic Atherosclerosis (ICD10-I70.0). Electronically Signed   By: Lucienne Capers M.D.   On: 01/27/2017 06:27   Dg Chest Port 1 View  Result Date: 01/27/2017 CLINICAL DATA:  Chest pain for 3 days EXAM: PORTABLE CHEST 1 VIEW COMPARISON:  Chest radiograph 12/01/2016 FINDINGS: Patient is rotated to the right. Cardiomegaly is unchanged. There is no focal airspace consolidation or pulmonary edema. No sizable pleural effusion. IMPRESSION: Unchanged cardiomegaly, again probably exaggerated by the degree of rotation. No focal airspace disease or pulmonary edema. Electronically Signed   By: Ulyses Jarred  M.D.   On: 01/27/2017 02:49    Procedures Procedures (including critical care time)  Medications Ordered in ED Medications - No data to display   Initial Impression / Assessment and Plan / ED Course  I have reviewed the triage vital signs and the nursing notes.  Pertinent labs & imaging results that were available during my care of the patient were reviewed by me and considered in my medical decision making (see chart for details).  Chest pain and dyspnea concerning for possible CHF/ACS. Old records reviewed, and she has been treated for lymphedema. Hospital admission for possible TIA was negative and there was some question of drug-seeking during her hospitalization. She has seen a cardiologist for diastolic heart failure. She is requesting her evening dose of oxycodone-acetaminophen and alprazolam. She states she cannot take that at home. This is given to her. She is given oral aspirin. ECG shows no acute changes. Screening labs are obtained including troponin and BNP. Chest x-ray has been ordered.  Chest x-ray showed unchanged cardiomegaly with no evidence of pulmonary vascular congestion. BNP is mildly elevated. She has a mild anemia, which is unchanged from baseline. Mild renal insufficiency is present which is actually improved over baseline. D-dimer was checked because patient is somewhat sedentary and felt to be at risk for pulmonary embolism. It has come back significantly elevated, and she is sent for CT angiogram which is negative for pulmonary embolism or occult pneumonia. At this point, her dyspnea seems to be related to exacerbation of her known diastolic heart failure. She is given a dose of furosemide intravenously. Patient is insisting that she must come in the hospital for IV furosemide. Advised that her evaluation in the ED does not show severe enough illness to warrant inpatient care. She is discharged with instructions to increase her furosemide from 40 mg twice a day to 80 mg  twice a day, follow-up with PCP. If she fails this regimen as an outpatient, can consider admission at that time. Return precautions discussed.  Final Clinical Impressions(s) / ED Diagnoses  Final diagnoses:  Atypical chest pain  Acute on chronic diastolic heart failure (HCC)  Renal insufficiency  Normochromic normocytic anemia    New Prescriptions New Prescriptions   No medications on file     Delora Fuel, MD 88/87/57 [phone]

## 2017-01-27 NOTE — ED Notes (Signed)
Pt's linen changed and new gown provided. Bed bath performed.

## 2017-01-29 DIAGNOSIS — E1140 Type 2 diabetes mellitus with diabetic neuropathy, unspecified: Secondary | ICD-10-CM | POA: Diagnosis not present

## 2017-01-29 DIAGNOSIS — Z79891 Long term (current) use of opiate analgesic: Secondary | ICD-10-CM | POA: Diagnosis not present

## 2017-01-29 DIAGNOSIS — I110 Hypertensive heart disease with heart failure: Secondary | ICD-10-CM | POA: Diagnosis not present

## 2017-01-29 DIAGNOSIS — J449 Chronic obstructive pulmonary disease, unspecified: Secondary | ICD-10-CM | POA: Diagnosis not present

## 2017-01-29 DIAGNOSIS — L98492 Non-pressure chronic ulcer of skin of other sites with fat layer exposed: Secondary | ICD-10-CM | POA: Diagnosis not present

## 2017-01-29 DIAGNOSIS — I509 Heart failure, unspecified: Secondary | ICD-10-CM | POA: Diagnosis not present

## 2017-01-29 DIAGNOSIS — Z9981 Dependence on supplemental oxygen: Secondary | ICD-10-CM | POA: Diagnosis not present

## 2017-01-29 DIAGNOSIS — Z794 Long term (current) use of insulin: Secondary | ICD-10-CM | POA: Diagnosis not present

## 2017-01-29 DIAGNOSIS — E039 Hypothyroidism, unspecified: Secondary | ICD-10-CM | POA: Diagnosis not present

## 2017-01-29 DIAGNOSIS — Z9181 History of falling: Secondary | ICD-10-CM | POA: Diagnosis not present

## 2017-01-30 ENCOUNTER — Inpatient Hospital Stay (HOSPITAL_COMMUNITY): Payer: Medicare Other

## 2017-01-30 ENCOUNTER — Encounter (HOSPITAL_COMMUNITY): Payer: Self-pay | Admitting: Internal Medicine

## 2017-01-30 ENCOUNTER — Inpatient Hospital Stay (HOSPITAL_COMMUNITY)
Admission: AD | Admit: 2017-01-30 | Discharge: 2017-02-05 | DRG: 602 | Disposition: A | Discharge status: Home Health | Payer: Medicare Other | Source: Ambulatory Visit | Attending: Internal Medicine | Admitting: Internal Medicine

## 2017-01-30 DIAGNOSIS — R0602 Shortness of breath: Secondary | ICD-10-CM | POA: Diagnosis not present

## 2017-01-30 DIAGNOSIS — N183 Chronic kidney disease, stage 3 unspecified: Secondary | ICD-10-CM | POA: Diagnosis present

## 2017-01-30 DIAGNOSIS — Z8249 Family history of ischemic heart disease and other diseases of the circulatory system: Secondary | ICD-10-CM

## 2017-01-30 DIAGNOSIS — E11622 Type 2 diabetes mellitus with other skin ulcer: Secondary | ICD-10-CM | POA: Diagnosis not present

## 2017-01-30 DIAGNOSIS — Z833 Family history of diabetes mellitus: Secondary | ICD-10-CM

## 2017-01-30 DIAGNOSIS — J9811 Atelectasis: Secondary | ICD-10-CM | POA: Diagnosis not present

## 2017-01-30 DIAGNOSIS — F419 Anxiety disorder, unspecified: Secondary | ICD-10-CM | POA: Diagnosis present

## 2017-01-30 DIAGNOSIS — Z888 Allergy status to other drugs, medicaments and biological substances status: Secondary | ICD-10-CM | POA: Diagnosis not present

## 2017-01-30 DIAGNOSIS — D509 Iron deficiency anemia, unspecified: Secondary | ICD-10-CM | POA: Diagnosis present

## 2017-01-30 DIAGNOSIS — I5032 Chronic diastolic (congestive) heart failure: Secondary | ICD-10-CM | POA: Diagnosis not present

## 2017-01-30 DIAGNOSIS — Z87891 Personal history of nicotine dependence: Secondary | ICD-10-CM

## 2017-01-30 DIAGNOSIS — Z885 Allergy status to narcotic agent status: Secondary | ICD-10-CM

## 2017-01-30 DIAGNOSIS — E1122 Type 2 diabetes mellitus with diabetic chronic kidney disease: Secondary | ICD-10-CM | POA: Diagnosis present

## 2017-01-30 DIAGNOSIS — J449 Chronic obstructive pulmonary disease, unspecified: Secondary | ICD-10-CM | POA: Diagnosis present

## 2017-01-30 DIAGNOSIS — Z23 Encounter for immunization: Secondary | ICD-10-CM | POA: Diagnosis not present

## 2017-01-30 DIAGNOSIS — I130 Hypertensive heart and chronic kidney disease with heart failure and stage 1 through stage 4 chronic kidney disease, or unspecified chronic kidney disease: Secondary | ICD-10-CM | POA: Diagnosis present

## 2017-01-30 DIAGNOSIS — Z9981 Dependence on supplemental oxygen: Secondary | ICD-10-CM

## 2017-01-30 DIAGNOSIS — E119 Type 2 diabetes mellitus without complications: Secondary | ICD-10-CM

## 2017-01-30 DIAGNOSIS — Z880 Allergy status to penicillin: Secondary | ICD-10-CM | POA: Diagnosis not present

## 2017-01-30 DIAGNOSIS — M62838 Other muscle spasm: Secondary | ICD-10-CM | POA: Diagnosis not present

## 2017-01-30 DIAGNOSIS — L03311 Cellulitis of abdominal wall: Principal | ICD-10-CM | POA: Diagnosis present

## 2017-01-30 DIAGNOSIS — Z881 Allergy status to other antibiotic agents status: Secondary | ICD-10-CM | POA: Diagnosis not present

## 2017-01-30 DIAGNOSIS — R0600 Dyspnea, unspecified: Secondary | ICD-10-CM | POA: Diagnosis present

## 2017-01-30 DIAGNOSIS — L98491 Non-pressure chronic ulcer of skin of other sites limited to breakdown of skin: Secondary | ICD-10-CM | POA: Diagnosis not present

## 2017-01-30 DIAGNOSIS — L98499 Non-pressure chronic ulcer of skin of other sites with unspecified severity: Secondary | ICD-10-CM

## 2017-01-30 DIAGNOSIS — R600 Localized edema: Secondary | ICD-10-CM | POA: Diagnosis present

## 2017-01-30 DIAGNOSIS — R109 Unspecified abdominal pain: Secondary | ICD-10-CM

## 2017-01-30 DIAGNOSIS — K5900 Constipation, unspecified: Secondary | ICD-10-CM | POA: Diagnosis not present

## 2017-01-30 DIAGNOSIS — E43 Unspecified severe protein-calorie malnutrition: Secondary | ICD-10-CM | POA: Diagnosis not present

## 2017-01-30 DIAGNOSIS — Z6841 Body Mass Index (BMI) 40.0 and over, adult: Secondary | ICD-10-CM | POA: Diagnosis not present

## 2017-01-30 DIAGNOSIS — Z794 Long term (current) use of insulin: Secondary | ICD-10-CM | POA: Diagnosis not present

## 2017-01-30 DIAGNOSIS — E1140 Type 2 diabetes mellitus with diabetic neuropathy, unspecified: Secondary | ICD-10-CM | POA: Diagnosis not present

## 2017-01-30 DIAGNOSIS — E039 Hypothyroidism, unspecified: Secondary | ICD-10-CM | POA: Diagnosis present

## 2017-01-30 DIAGNOSIS — L98492 Non-pressure chronic ulcer of skin of other sites with fat layer exposed: Secondary | ICD-10-CM | POA: Diagnosis not present

## 2017-01-30 DIAGNOSIS — K8020 Calculus of gallbladder without cholecystitis without obstruction: Secondary | ICD-10-CM | POA: Diagnosis not present

## 2017-01-30 LAB — CBC
HCT: 34.1 % — ABNORMAL LOW (ref 36.0–46.0)
Hemoglobin: 10.4 g/dL — ABNORMAL LOW (ref 12.0–15.0)
MCH: 25.7 pg — ABNORMAL LOW (ref 26.0–34.0)
MCHC: 30.5 g/dL (ref 30.0–36.0)
MCV: 84.4 fL (ref 78.0–100.0)
Platelets: 298 10*3/uL (ref 150–400)
RBC: 4.04 MIL/uL (ref 3.87–5.11)
RDW: 15.8 % — ABNORMAL HIGH (ref 11.5–15.5)
WBC: 7.8 10*3/uL (ref 4.0–10.5)

## 2017-01-30 LAB — COMPREHENSIVE METABOLIC PANEL
ALT: 16 U/L (ref 14–54)
AST: 27 U/L (ref 15–41)
Albumin: 3.6 g/dL (ref 3.5–5.0)
Alkaline Phosphatase: 96 U/L (ref 38–126)
Anion gap: 9 (ref 5–15)
BUN: 19 mg/dL (ref 6–20)
CO2: 28 mmol/L (ref 22–32)
Calcium: 8.7 mg/dL — ABNORMAL LOW (ref 8.9–10.3)
Chloride: 101 mmol/L (ref 101–111)
Creatinine, Ser: 1.17 mg/dL — ABNORMAL HIGH (ref 0.44–1.00)
GFR calc Af Amer: 55 mL/min — ABNORMAL LOW (ref >60)
GFR calc non Af Amer: 47 mL/min — ABNORMAL LOW (ref >60)
Glucose, Bld: 208 mg/dL — ABNORMAL HIGH (ref 65–99)
Potassium: 4.2 mmol/L (ref 3.5–5.1)
Sodium: 138 mmol/L (ref 135–145)
Total Bilirubin: 0.4 mg/dL (ref 0.3–1.2)
Total Protein: 6.7 g/dL (ref 6.5–8.1)

## 2017-01-30 LAB — GLUCOSE, CAPILLARY: Glucose-Capillary: 254 mg/dL — ABNORMAL HIGH (ref 65–99)

## 2017-01-30 LAB — TSH: TSH: 1.525 u[IU]/mL (ref 0.350–4.500)

## 2017-01-30 LAB — TROPONIN I: Troponin I: <0.03 ng/mL (ref <0.03)

## 2017-01-30 MED ORDER — SODIUM CHLORIDE 0.9 % IV SOLN: 250.0000 mL | INTRAVENOUS | Status: DC | PRN

## 2017-01-30 MED ORDER — LEVOTHYROXINE SODIUM 88 MCG PO TABS
244.0000 ug | ORAL_TABLET | Freq: Every day | ORAL | Status: DC
  Administered 2017-01-31 – 2017-02-05 (×6): 244 ug via ORAL
  Filled 2017-01-30 (×6): qty 2

## 2017-01-30 MED ORDER — INSULIN DETEMIR 100 UNIT/ML ~~LOC~~ SOLN
50.0000 [IU] | Freq: Every day | SUBCUTANEOUS | Status: DC
  Administered 2017-01-30 – 2017-02-04 (×6): 50 [IU] via SUBCUTANEOUS
  Filled 2017-01-30 (×7): qty 0.5

## 2017-01-30 MED ORDER — ENOXAPARIN SODIUM 80 MG/0.8ML ~~LOC~~ SOLN
0.5000 mg/kg | SUBCUTANEOUS | Status: DC
  Administered 2017-01-30 – 2017-02-04 (×6): 70 mg via SUBCUTANEOUS
  Filled 2017-01-30 (×6): qty 0.7

## 2017-01-30 MED ORDER — INSULIN ASPART 100 UNIT/ML ~~LOC~~ SOLN
10.0000 [IU] | Freq: Three times a day (TID) | SUBCUTANEOUS | Status: DC
  Administered 2017-01-31 – 2017-02-05 (×15): 10 [IU] via SUBCUTANEOUS
  Filled 2017-01-30 (×4): qty 1

## 2017-01-30 MED ORDER — OXYCODONE-ACETAMINOPHEN 5-325 MG PO TABS
1.0000 | ORAL_TABLET | Freq: Four times a day (QID) | ORAL | Status: DC | PRN
  Administered 2017-01-30 – 2017-02-05 (×9): 1 via ORAL
  Filled 2017-01-30 (×10): qty 1

## 2017-01-30 MED ORDER — ACETAMINOPHEN 325 MG PO TABS
650.0000 mg | ORAL_TABLET | Freq: Four times a day (QID) | ORAL | Status: DC | PRN
  Administered 2017-01-31: 650 mg via ORAL
  Filled 2017-01-30: qty 2

## 2017-01-30 MED ORDER — SODIUM CHLORIDE 0.9 % IV SOLN
1500.0000 mg | INTRAVENOUS | Status: DC
  Administered 2017-01-31 – 2017-02-01 (×2): 1500 mg via INTRAVENOUS
  Filled 2017-01-30 (×3): qty 1500

## 2017-01-30 MED ORDER — VANCOMYCIN HCL 10 G IV SOLR
2000.0000 mg | Freq: Once | INTRAVENOUS | Status: AC
  Administered 2017-01-30: 2000 mg via INTRAVENOUS
  Filled 2017-01-30: qty 2000

## 2017-01-30 MED ORDER — POTASSIUM CHLORIDE CRYS ER 10 MEQ PO TBCR
20.0000 meq | EXTENDED_RELEASE_TABLET | Freq: Two times a day (BID) | ORAL | Status: DC
  Administered 2017-01-30 – 2017-02-05 (×12): 20 meq via ORAL
  Filled 2017-01-30 (×13): qty 2

## 2017-01-30 MED ORDER — INSULIN ASPART 100 UNIT/ML ~~LOC~~ SOLN
0.0000 [IU] | Freq: Three times a day (TID) | SUBCUTANEOUS | Status: DC
  Administered 2017-01-31 (×2): 1 [IU] via SUBCUTANEOUS
  Administered 2017-01-31: 2 [IU] via SUBCUTANEOUS
  Administered 2017-02-01: 3 [IU] via SUBCUTANEOUS
  Administered 2017-02-01: 1 [IU] via SUBCUTANEOUS
  Administered 2017-02-02 – 2017-02-04 (×2): 3 [IU] via SUBCUTANEOUS
  Administered 2017-02-04 – 2017-02-05 (×2): 1 [IU] via SUBCUTANEOUS
  Administered 2017-02-05: 5 [IU] via SUBCUTANEOUS
  Filled 2017-01-30: qty 1

## 2017-01-30 MED ORDER — DEXTROSE 5 % IV SOLN
2.0000 g | INTRAVENOUS | Status: DC
  Administered 2017-01-30 – 2017-02-04 (×6): 2 g via INTRAVENOUS
  Filled 2017-01-30 (×7): qty 2

## 2017-01-30 MED ORDER — ALPRAZOLAM 1 MG PO TABS
1.0000 mg | ORAL_TABLET | Freq: Three times a day (TID) | ORAL | Status: DC | PRN
  Administered 2017-01-30 – 2017-02-05 (×10): 1 mg via ORAL
  Filled 2017-01-30 (×11): qty 1

## 2017-01-30 MED ORDER — VITAMIN D3 25 MCG (1000 UNIT) PO TABS
5000.0000 [IU] | ORAL_TABLET | Freq: Every day | ORAL | Status: DC
  Administered 2017-01-31 – 2017-02-03 (×3): 5000 [IU] via ORAL
  Filled 2017-01-30 (×6): qty 5

## 2017-01-30 MED ORDER — ACETAMINOPHEN 650 MG RE SUPP: 650.0000 mg | Freq: Four times a day (QID) | RECTAL | Status: DC | PRN

## 2017-01-30 MED ORDER — DEXTROSE 5 % IV SOLN
1.0000 g | INTRAVENOUS | Status: DC
  Filled 2017-01-30: qty 10

## 2017-01-30 MED ORDER — NYSTATIN 100000 UNIT/GM EX POWD
Freq: Two times a day (BID) | CUTANEOUS | Status: DC
  Administered 2017-01-31 – 2017-02-03 (×7): via TOPICAL
  Administered 2017-02-04: 1 via TOPICAL
  Administered 2017-02-04 – 2017-02-05 (×2): via TOPICAL
  Filled 2017-01-30: qty 15

## 2017-01-30 MED ORDER — LIRAGLUTIDE 18 MG/3ML ~~LOC~~ SOPN
1.2000 mg | PEN_INJECTOR | Freq: Every day | SUBCUTANEOUS | Status: DC
  Administered 2017-02-05: 1.2 mg via SUBCUTANEOUS

## 2017-01-30 MED ORDER — DILTIAZEM HCL ER COATED BEADS 180 MG PO CP24
300.0000 mg | ORAL_CAPSULE | Freq: Every day | ORAL | Status: DC
  Administered 2017-01-31 – 2017-02-05 (×6): 300 mg via ORAL
  Filled 2017-01-30 (×7): qty 1

## 2017-01-30 MED ORDER — INSULIN ASPART 100 UNIT/ML ~~LOC~~ SOLN
0.0000 [IU] | Freq: Every day | SUBCUTANEOUS | Status: DC
  Administered 2017-01-30: 3 [IU] via SUBCUTANEOUS
  Administered 2017-01-31: 2 [IU] via SUBCUTANEOUS

## 2017-01-30 MED ORDER — FAMOTIDINE 20 MG PO TABS
20.0000 mg | ORAL_TABLET | Freq: Two times a day (BID) | ORAL | Status: DC
  Administered 2017-01-30 – 2017-02-05 (×12): 20 mg via ORAL
  Filled 2017-01-30 (×12): qty 1

## 2017-01-30 MED ORDER — SODIUM CHLORIDE 0.9% FLUSH
3.0000 mL | Freq: Two times a day (BID) | INTRAVENOUS | Status: DC
  Administered 2017-01-31 – 2017-02-04 (×6): 3 mL via INTRAVENOUS

## 2017-01-30 MED ORDER — SODIUM CHLORIDE 0.9% FLUSH
3.0000 mL | INTRAVENOUS | Status: DC | PRN
  Administered 2017-02-05: 3 mL via INTRAVENOUS
  Filled 2017-01-30: qty 3

## 2017-01-30 MED ORDER — OXYCODONE HCL 5 MG PO TABS
5.0000 mg | ORAL_TABLET | Freq: Four times a day (QID) | ORAL | Status: DC | PRN
  Administered 2017-01-31 – 2017-02-05 (×6): 5 mg via ORAL
  Filled 2017-01-30 (×7): qty 1

## 2017-01-30 MED ORDER — ALBUTEROL SULFATE (2.5 MG/3ML) 0.083% IN NEBU
2.5000 mg | INHALATION_SOLUTION | Freq: Four times a day (QID) | RESPIRATORY_TRACT | Status: DC | PRN
  Administered 2017-01-31: 2.5 mg via RESPIRATORY_TRACT
  Filled 2017-01-30: qty 3

## 2017-01-30 MED ORDER — INFLUENZA VAC SPLIT HIGH-DOSE 0.5 ML IM SUSY
0.5000 mL | PREFILLED_SYRINGE | INTRAMUSCULAR | Status: AC
  Administered 2017-01-31: 0.5 mL via INTRAMUSCULAR
  Filled 2017-01-30: qty 0.5

## 2017-01-30 MED ORDER — FUROSEMIDE 10 MG/ML IJ SOLN
40.0000 mg | Freq: Two times a day (BID) | INTRAMUSCULAR | Status: DC
  Administered 2017-01-30 – 2017-02-02 (×7): 40 mg via INTRAVENOUS
  Filled 2017-01-30 (×7): qty 4

## 2017-01-30 MED ORDER — OXYCODONE-ACETAMINOPHEN 10-325 MG PO TABS: 1.0000 | ORAL_TABLET | Freq: Four times a day (QID) | ORAL | Status: DC | PRN

## 2017-01-30 MED ORDER — ASPIRIN EC 81 MG PO TBEC
81.0000 mg | DELAYED_RELEASE_TABLET | Freq: Every day | ORAL | Status: DC
  Administered 2017-01-31 – 2017-02-05 (×6): 81 mg via ORAL
  Filled 2017-01-30 (×6): qty 1

## 2017-01-30 NOTE — H&P (Addendum)
H&P   Patient Demographics:    Stephanie Combs, is a 67 y.o. female  MRN: [id number]   DOB - [birth year]  Admit Date - 01/30/2017  Outpatient Primary MD for the patient is [name], MD  Referring MD/NP/PA:   Outpatient Specialists:  Dr. Dorris Fetch (endocrinologist)    Patient coming from:  home  No chief complaint on file. skin ulcer, nonhealing, dyspnea    HPI:    Stephanie Combs  is a 66 y.o. female, w Dm2, hypertension, morbid obeisity, hypothyroidism, CHF (EF 40-45%), Copd, c/o increase in size of skin ulcer w yellow discharge as well as increase in dyspnea and orthopnea, and slight leg swelling.  + weight gain.  Pt denies fever, chills, cough, cp, palp, n/v, diarrhea, dysuria, hematuria.  Her endocrinologist physician has recommended inpatient admission with iv abx.  Pt was seen today in office and sent for direct admission for large abdominal wound as well as w/up of dyspnea.     Review of systems:    In addition to the HPI above,  No Fever-chills, No Headache, No changes with Vision or hearing, No problems swallowing food or Liquids, No Chest pain, No Cough  No Abdominal pain, No Nausea or Vommitting, Bowel movements are regular, No Blood in stool or Urine, No dysuria,  No new joints pains-aches,  No new weakness, tingling, numbness in any extremity, No recent weight gain or loss, No polyuria, polydypsia or polyphagia, No significant Mental Stressors.  A full 10 point Review of Systems was done, except as stated above, all other Review of Systems were negative.   With Past History of the following :    Past Medical History:  Diagnosis Date  . Anxiety   . CHF (congestive heart failure) (HCC)    Diastolic  . Chronic back pain   . COPD (chronic obstructive pulmonary disease) ([place])   . Degenerative disk disease   . Diabetes mellitus without complication ([place])   .  Hypertension   . Hypothyroidism   . On home O2    2.5 L N/C prn  . Pedal edema       Past Surgical History:  Procedure Laterality Date  . HERNIA REPAIR    . TUBAL LIGATION        Social History:     Social History  Substance Use Topics  . Smoking status: Former Smoker    Packs/day: 0.25    Types: Cigarettes    Quit date: 06/27/2015  . Smokeless tobacco: Never Used  . Alcohol use No     Lives - at home   Mobility -   Walks with walker at baseline   Family History :     Family History  Problem Relation Age of Onset  . Stroke Mother   . Heart attack Father   . Diabetes Brother   . Cerebral palsy Brother   . Pneumonia Brother   . Diabetes Other   . Heart  attack Other       Home Medications:   Prior to Admission medications   Medication Sig Start Date End Date Taking? Authorizing Provider  albuterol (PROAIR HFA) 108 (90 BASE) MCG/ACT inhaler Inhale 1 puff into the lungs every 4 (four) hours as needed. For shortness of breath Patient taking differently: Inhale 2 puffs into the lungs every 4 (four) hours as needed. For shortness of breath 05/30/13   Kinnie Feil, MD  alprazolam Duanne Moron) 2 MG tablet Take 2 mg by mouth 3 (three) times daily as needed for sleep or anxiety.    [provider]  aspirin EC 81 MG tablet Take 81 mg by mouth daily.    [provider]  Cholecalciferol (VITAMIN D3) 5000 units CAPS Take 1 capsule (5,000 Units total) by mouth daily. 08/19/16   Cassandria Anger, MD  diltiazem (CARDIZEM CD) 300 MG 24 hr capsule Take 300 mg by mouth daily.    [provider]  furosemide (LASIX) 20 MG tablet Take 2 tablets (40 mg total) by mouth 2 (two) times daily. 12/06/14   Isaac Bliss, Rayford Halsted, MD  gabapentin (NEURONTIN) 400 MG capsule Take 3 capsules (1,200 mg total) by mouth 2 (two) times daily. Patient taking differently: Take 1,600 mg by mouth 2 (two) times daily.  05/02/15   Orvan Falconer, MD  insulin detemir (LEVEMIR)  100 UNIT/ML injection Inject 0.5 mLs (50 Units total) into the skin at bedtime. 11/27/16   Cassandria Anger, MD  insulin lispro (HUMALOG) 100 UNIT/ML injection Inject 0.1-0.16 mLs (10-16 Units total) into the skin 3 (three) times daily before meals. Patient taking differently: Inject 15-20 Units into the skin 3 (three) times daily before meals.  11/27/16   Cassandria Anger, MD  INSULIN SYRINGE .5CC/29G 29G X 1/2" 0.5 ML MISC Use to inject insulin 4 times a day. 11/27/16   Cassandria Anger, MD  levothyroxine (SYNTHROID, LEVOTHROID) 100 MCG tablet Take 1 tablet (100 mcg total) by mouth daily before breakfast. Patient taking differently: Take 244 mcg by mouth daily before breakfast.  08/19/16   Nida, Marella Chimes, MD  lidocaine-prilocaine (EMLA) cream Apply 1 application topically as needed. 10/10/16   Eber Jones, MD  Liraglutide (VICTOZA [place]) Inject 1.2 mg into the skin daily after breakfast.    [provider]  nystatin (MYCOSTATIN/NYSTOP) powder Apply topically 2 (two) times daily. 10/10/16   Eber Jones, MD  Omega-3 Fatty Acids (FISH OIL PO) Take 1 capsule by mouth daily.     [provider]  ondansetron (ZOFRAN ODT) 8 MG disintegrating tablet Take 1 tablet (8 mg total) by mouth every 8 (eight) hours as needed for nausea or vomiting. Patient taking differently: Take 8 mg by mouth daily.  12/31/13   Jola Schmidt, MD  oxyCODONE-acetaminophen (PERCOCET) 10-325 MG tablet Take 1 tablet by mouth every 6 (six) hours as needed for pain.    [provider]  potassium chloride (K-DUR) 10 MEQ tablet Take 20 mEq by mouth 2 (two) times daily.     [provider]  ranitidine (ZANTAC) 150 MG tablet Take 150 mg by mouth daily.    [provider]  silver sulfADIAZINE (SILVADENE) 1 % cream Apply 1 application topically daily.    [provider]     Allergies:     Allergies  Allergen Reactions  . Dilaudid [Hydromorphone Hcl]  Itching  . Actifed Cold-Allergy [Chlorpheniramine-Phenylephrine]     "I was sick and red and it didn't agree  with me at all"  . Doxycycline Nausea And Vomiting  . Other Cough    Pt states she is allergic to ragweed and that she starts coughing and sneezing like crazy  . Penicillins Hives and Itching    Has patient had a PCN reaction causing immediate rash, facial/tongue/throat swelling, SOB or lightheadedness with hypotension: Yes Has patient had a PCN reaction causing severe rash involving mucus membranes or skin necrosis: No Has patient had a PCN reaction that required hospitalization Yes Has patient had a PCN reaction occurring within the last 10 years: No If all of the above answers are "NO", then may proceed with Cephalosporin use.   . Reglan [Metoclopramide] Itching  . Valium [Diazepam] Itching  . Vistaril [Hydroxyzine Hcl] Itching     Physical Exam:   Vitals  There were no vitals taken for this visit.   1. General  lying in bed in NAD,   2. Normal affect and insight, Not Suicidal or Homicidal, Awake Alert, Oriented X 3.  3. No F.N deficits, ALL C.Nerves Intact, Strength 5/5 all 4 extremities, Sensation intact all 4 extremities, Plantars down going.  4. Ears and Eyes appear Normal, Conjunctivae clear, PERRLA. Moist Oral Mucosa.  5. Supple Neck, slight  JVD, No cervical lymphadenopathy appriciated, No Carotid Bruits.  6. Symmetrical Chest wall movement, Good air movement bilaterally, slight crackle bilateral base, no wheezing.   7. RRR, No Gallops, Rubs or Murmurs, No Parasternal Heave.  8. Positive Bowel Sounds, Abdomen Soft, No tenderness, No organomegaly appriciated,No rebound -guarding or rigidity.  9.  No Cyanosis,  Trace edema  10. Good muscle tone,  joints appear normal , no effusions, Normal ROM.  11. No Palpable Lymph Nodes in Neck or Axillae  Multiple shallow skin ulcers on the left lateral abdomen largest about 2.5x3 cm round.   Large 7cm skin ulcer  below  mid abdomen   Data Review:    CBC  Recent Labs Lab 01/27/17 0109  WBC 8.0  HGB 10.9*  HCT 35.3*  PLT 333  MCV 85.5  MCH 26.4  MCHC 30.9  RDW 15.7*  LYMPHSABS 1.9  MONOABS 0.9  EOSABS 0.2  BASOSABS 0.1   ------------------------------------------------------------------------------------------------------------------  Chemistries   Recent Labs Lab 01/27/17 0109  NA 138  K 3.7  CL 101  CO2 27  GLUCOSE 148*  BUN 25*  CREATININE 1.09*  CALCIUM 9.1  AST 15  ALT 13*  ALKPHOS 93  BILITOT 0.5   ------------------------------------------------------------------------------------------------------------------ estimated creatinine clearance is 73.3 mL/min (A) (by C-G formula based on SCr of 1.09 mg/dL (H)). ------------------------------------------------------------------------------------------------------------------ No results for input(s): TSH, T4TOTAL, T3FREE, THYROIDAB in the last 72 hours.  Invalid input(s): FREET3  Coagulation profile No results for input(s): INR, PROTIME in the last 168 hours. ------------------------------------------------------------------------------------------------------------------- No results for input(s): DDIMER in the last 72 hours. -------------------------------------------------------------------------------------------------------------------  Cardiac Enzymes  Recent Labs Lab 01/27/17 0109  TROPONINI <0.03   ------------------------------------------------------------------------------------------------------------------    Component Value Date/Time   BNP 125.0 (H) 01/27/2017 0109     ---------------------------------------------------------------------------------------------------------------  Urinalysis    Component Value Date/Time   COLORURINE YELLOW 12/01/2016 1628   APPEARANCEUR CLEAR 12/01/2016 1628   LABSPEC 1.013 12/01/2016 1628   PHURINE 6.0 12/01/2016 1628   GLUCOSEU NEGATIVE 12/01/2016 1628    HGBUR NEGATIVE 12/01/2016 1628   BILIRUBINUR NEGATIVE 12/01/2016 1628   KETONESUR NEGATIVE 12/01/2016 1628   PROTEINUR 30 (A) 12/01/2016 1628   UROBILINOGEN 0.2 12/04/2014 0650   NITRITE NEGATIVE 12/01/2016 1628   LEUKOCYTESUR NEGATIVE 12/01/2016 1628    ----------------------------------------------------------------------------------------------------------------  Imaging Results:    No results found.    Assessment & Plan:    Principal Problem:   Skin ulcer ([place]) Active Problems:   DM type 2 (diabetes mellitus, type 2) ([place])   CKD (chronic kidney disease), stage III   Chronic diastolic CHF (congestive heart failure) (HCC)   Dyspnea   Bilateral edema of lower extremity    Skin ulcer/ wound Wound culture vanco iv pharmacy to dose, rocephin iv Wound care rn consult Wet to dry dressing for now  CHF (EF 40%) CXR  BNP Trop 12 lead ekg Lasix 40mg  iv bid Check daily weight strict I and O Consider echo if not improving  Bilateral LE edema Lasix iv as above  CKD stage 3 Check cmp in am  Dm2 fsbs ac and qhs, ISS  Copd Albuterol neb 1 q6h prn       DVT Prophylaxis  Lovenox -  AM Labs Ordered, also please review Full Orders  Family Communication: Admission, patients condition and plan of care including tests being ordered have been discussed with the patient  who indicate understanding and agree with the plan and Code Status.  Code Status FULL CODE  Likely DC to  home  Condition GUARDED    Consults called: none  Admission status: inpatient   Time spent in minutes : 45 minutes   [name] M.D on 01/30/2017 at 5:35 PM  Between 7am to [place] - Pager - [phone]

## 2017-01-30 NOTE — Progress Notes (Signed)
Patient stated that MD "promised her" a foley catheter because they were going to be giving her IV lasix.   Patient very upset at this time. RN tried informing her of hospital processes, and that RN's must have MD order to insert a foley catheter. Patient stated to page the MD.

## 2017-01-30 NOTE — Progress Notes (Signed)
Pharmacy Antibiotic Note  Stephanie Combs is a 66 y.o. female admitted on 01/30/2017 with abdominal wound.  Pharmacy has been consulted for vancomycin dosing. MD is dosing ceftriaxone.  Note Hx CKD, SCr 1.17, CrCl 53 ml/min/1.47m2 (normalized)  Plan: Vancomycin 2g IV x 1, then 1500mg  IV q24h Check trough at steady state, goal 10-15 mcg/ml Ceftriaxone 2g IV q24h (per MD) Follow up renal function & cultures   Height: 5\' 6"  (167.6 cm) Weight: (!) 300 lb 0.7 oz (136.1 kg) IBW/kg (Calculated) : 59.3  Temp (24hrs), Avg:98.5 F (36.9 C), Min:98.5 F (36.9 C), Max:98.5 F (36.9 C)   Recent Labs Lab 01/27/17 0109 01/30/17 1816  WBC 8.0 7.8  CREATININE 1.09* 1.17*    Estimated Creatinine Clearance: 66.3 mL/min (A) (by C-G formula based on SCr of 1.17 mg/dL (H)).    Allergies  Allergen Reactions  . Dilaudid [Hydromorphone Hcl] Itching  . Actifed Cold-Allergy [Chlorpheniramine-Phenylephrine]     "I was sick and red and it didn't agree with me at all"  . Doxycycline Nausea And Vomiting  . Other Cough    Pt states she is allergic to ragweed and that she starts coughing and sneezing like crazy  . Penicillins Hives and Itching    Has patient had a PCN reaction causing immediate rash, facial/tongue/throat swelling, SOB or lightheadedness with hypotension: Yes Has patient had a PCN reaction causing severe rash involving mucus membranes or skin necrosis: No Has patient had a PCN reaction that required hospitalization Yes Has patient had a PCN reaction occurring within the last 10 years: No If all of the above answers are "NO", then may proceed with Cephalosporin use.   . Reglan [Metoclopramide] Itching  . Valium [Diazepam] Itching  . Vistaril [Hydroxyzine Hcl] Itching    Antimicrobials this admission: 9/21 Vancomycin >> 9/21 Ceftriaxone >>  Dose adjustments this admission:   Microbiology results: 9/21 Wound:  Thank you for allowing pharmacy to be a part of this patient's  care.  Peggyann Juba, PharmD, BCPS Pager: [phone] 01/30/2017 7:53 PM

## 2017-01-31 LAB — GLUCOSE, CAPILLARY
Glucose-Capillary: 124 mg/dL — ABNORMAL HIGH (ref 65–99)
Glucose-Capillary: 127 mg/dL — ABNORMAL HIGH (ref 65–99)
Glucose-Capillary: 181 mg/dL — ABNORMAL HIGH (ref 65–99)
Glucose-Capillary: 243 mg/dL — ABNORMAL HIGH (ref 65–99)

## 2017-01-31 LAB — BRAIN NATRIURETIC PEPTIDE: B Natriuretic Peptide: 115.6 pg/mL — ABNORMAL HIGH (ref 0.0–100.0)

## 2017-01-31 LAB — HEMOGLOBIN A1C
Hgb A1c MFr Bld: 7.5 % — ABNORMAL HIGH (ref 4.8–5.6)
Mean Plasma Glucose: 168.55 mg/dL

## 2017-01-31 MED ORDER — ALBUTEROL SULFATE (2.5 MG/3ML) 0.083% IN NEBU
2.5000 mg | INHALATION_SOLUTION | Freq: Three times a day (TID) | RESPIRATORY_TRACT | Status: DC
  Administered 2017-01-31 – 2017-02-01 (×6): 2.5 mg via RESPIRATORY_TRACT
  Filled 2017-01-31 (×6): qty 3

## 2017-01-31 MED ORDER — FLEET ENEMA 7-19 GM/118ML RE ENEM
1.0000 | ENEMA | Freq: Every day | RECTAL | Status: DC | PRN
  Administered 2017-01-31: 1 via RECTAL
  Filled 2017-01-31: qty 1

## 2017-01-31 MED ORDER — TIZANIDINE HCL 4 MG PO TABS
2.0000 mg | ORAL_TABLET | Freq: Four times a day (QID) | ORAL | Status: DC | PRN
  Administered 2017-01-31: 2 mg via ORAL
  Filled 2017-01-31: qty 1

## 2017-01-31 NOTE — Progress Notes (Signed)
Patient ID: Stephanie Combs, female   DOB: 06-19-1950, 66 y.o.   MRN: [id number]                                                                PROGRESS NOTE                                                                                                                                                                                                             Patient Demographics:    Stephanie Combs, is a 66 y.o. female, DOB - 09/05/1950, ZTI:[id number]  Admit date - 01/30/2017   Admitting Physician [name], MD  Outpatient Primary MD for the patient is [name], MD  LOS - 1  Outpatient Specialists:   No chief complaint on file. dyspnea, skin ulcer     Brief [place] y.o. female, w Dm2, hypertension, morbid obeisity, hypothyroidism, CHF (EF 40-45%), Copd, c/o increase in size of skin ulcer w yellow discharge as well as increase in dyspnea and orthopnea, and slight leg swelling.  + weight gain.  Pt denies fever, chills, cough, cp, palp, n/v, diarrhea, dysuria, hematuria.  Her endocrinologist physician has recommended inpatient admission with iv abx.  Pt was seen today in office and sent for direct admission for large abdominal wound as well as w/up of dyspnea.     Subjective:    Stephanie Combs today thinks her breathing has improved some with diuresis.  Wound stable.  Afebrile.  + constipation.    No headache, No chest pain, No abdominal pain - No Nausea, No new weakness tingling or numbness   Assessment  & Plan :    Principal Problem:   Skin ulcer ([place]) Active Problems:   DM type 2 (diabetes mellitus, type 2) (HCC)   CKD (chronic kidney disease), stage III   Chronic diastolic CHF (congestive heart failure) (HCC)   Dyspnea   Bilateral edema of lower extremity   Skin ulcer,  Wound culture pending Cont vanco iv, rocephin iv Wound care appreciate  Check cbc in am  Dyspnea, Chronic CHF, Bilateral lower ext edema.  Cont lasix 40mg  iv bid  Constipation Fleet enema  prn  Muscle spasm Tizanidine 2mg  po q6h prn   CKD stage3 Check cmp in am  Anemia Check b12, folate, ferritin, iron , tibc, spep  DM2 (Hga1c=7.5) Cont fsbs ac and qhs, ISS    Code Status : FULL CODE  Family Communication  : w patient  Disposition Plan  : home  Barriers For Discharge :   Consults  :  Wound care  Procedures  :   DVT Prophylaxis  :  Lovenox -  SCD  Lab Results  Component Value Date   PLT 298 01/30/2017    Antibiotics  :  vanco/rocephin  Anti-infectives    Start     Dose/Rate Route Frequency Ordered Stop   01/31/17 2200  vancomycin (VANCOCIN) 1,500 mg in sodium chloride 0.9 % 500 mL IVPB     1,500 mg 250 mL/hr over 120 Minutes Intravenous Every 24 hours 01/30/17 1957     01/30/17 2200  cefTRIAXone (ROCEPHIN) 1 g in dextrose 5 % 50 mL IVPB  Status:  Discontinued     1 g 100 mL/hr over 30 Minutes Intravenous Every 24 hours 01/30/17 1938 01/30/17 1950   01/30/17 2100  vancomycin (VANCOCIN) 2,000 mg in sodium chloride 0.9 % 500 mL IVPB     2,000 mg 250 mL/hr over 120 Minutes Intravenous  Once 01/30/17 1949 01/31/17 0006   01/30/17 2030  cefTRIAXone (ROCEPHIN) 2 g in dextrose 5 % 50 mL IVPB     2 g 100 mL/hr over 30 Minutes Intravenous Every 24 hours 01/30/17 1950          Objective:   Vitals:   01/30/17 1751 01/30/17 2200 01/31/17 0054 01/31/17 0537  BP:  (!) 145/61  (!) 150/70  Pulse:  87  82  Resp:  20  20  Temp:  98.6 F (37 C)  98.6 F (37 C)  TempSrc:      SpO2:  95% 99% 100%  Weight: (!) 136.1 kg (300 lb 0.7 oz)   (!) 136.6 kg (301 lb 2.4 oz)  Height: 5\' 6"  (1.676 m)       Wt Readings from Last 3 Encounters:  01/31/17 (!) 136.6 kg (301 lb 2.4 oz)  01/26/17 (!) 141.1 kg (311 lb)  12/01/16 (!) 145.6 kg (321 lb)     Intake/Output Summary (Last 24 hours) at 01/31/17 0621 Last data filed at 01/31/17 0538  Gross per 24 hour  Intake              960 ml  Output             2200 ml  Net            -1240 ml     Physical  Exam  Awake Alert, Oriented X 3, No new F.N deficits, Normal affect [place].AT,PERRAL Supple Neck,No JVD, No cervical lymphadenopathy appriciated.  Symmetrical Chest wall movement, Good air movement bilaterally, slight crackle, bilateral lung base, no wheezing,  RRR,No Gallops,Rubs or new Murmurs, No Parasternal Heave +ve B.Sounds, Abd Soft, No tenderness, No organomegaly appriciated, No rebound - guarding or rigidity. No Cyanosis, Clubbing , trace-1+ edema Multiple shallow skin ulcers on the left lateral abdomen largest about 2.5x3 cm round.   Large 7cm skin ulcer below  mid abdomen   Data Review:    CBC  Recent Labs Lab 01/27/17 0109 01/30/17 1816  WBC 8.0 7.8  HGB 10.9* 10.4*  HCT 35.3* 34.1*  PLT 333 298  MCV 85.5 84.4  MCH 26.4 25.7*  MCHC 30.9 30.5  RDW 15.7* 15.8*  LYMPHSABS 1.9  --   MONOABS 0.9  --   EOSABS 0.2  --   BASOSABS 0.1  --  Chemistries   Recent Labs Lab 01/27/17 0109 01/30/17 1816  NA 138 138  K 3.7 4.2  CL 101 101  CO2 27 28  GLUCOSE 148* 208*  BUN 25* 19  CREATININE 1.09* 1.17*  CALCIUM 9.1 8.7*  AST 15 27  ALT 13* 16  ALKPHOS 93 96  BILITOT 0.5 0.4   ------------------------------------------------------------------------------------------------------------------ No results for input(s): CHOL, HDL, LDLCALC, TRIG, CHOLHDL, LDLDIRECT in the last 72 hours.  Lab Results  Component Value Date   HGBA1C 7.5 (H) 01/30/2017   ------------------------------------------------------------------------------------------------------------------  Recent Labs  01/30/17 1816  TSH 1.525   ------------------------------------------------------------------------------------------------------------------ No results for input(s): VITAMINB12, FOLATE, FERRITIN, TIBC, IRON, RETICCTPCT in the last 72 hours.  Coagulation profile No results for input(s): INR, PROTIME in the last 168 hours.  No results for input(s): DDIMER in the last 72  hours.  Cardiac Enzymes  Recent Labs Lab 01/27/17 0109 01/30/17 2014  TROPONINI <0.03 <0.03   ------------------------------------------------------------------------------------------------------------------    Component Value Date/Time   BNP 115.6 (H) 01/30/2017 1816    Inpatient Medications  Scheduled Meds: . albuterol  2.5 mg Nebulization TID  . aspirin EC  81 mg Oral Daily  . cholecalciferol  5,000 Units Oral Daily  . diltiazem  300 mg Oral Daily  . enoxaparin (LOVENOX) injection  0.5 mg/kg Subcutaneous Q24H  . famotidine  20 mg Oral BID  . furosemide  40 mg Intravenous BID  . Influenza vac split quadrivalent PF  0.5 mL Intramuscular Tomorrow-1000  . insulin aspart  0-5 Units Subcutaneous QHS  . insulin aspart  0-9 Units Subcutaneous TID WC  . insulin aspart  10 Units Subcutaneous TID WC  . insulin detemir  50 Units Subcutaneous QHS  . levothyroxine  244 mcg Oral QAC breakfast  . liraglutide  1.2 mg Subcutaneous QPC breakfast  . nystatin   Topical BID  . potassium chloride  20 mEq Oral BID  . sodium chloride flush  3 mL Intravenous Q12H   Continuous Infusions: . sodium chloride    . cefTRIAXone (ROCEPHIN)  IV 2 g (01/30/17 2057)  . vancomycin     PRN Meds:.sodium chloride, acetaminophen **OR** acetaminophen, albuterol, ALPRAZolam, oxyCODONE-acetaminophen **AND** oxyCODONE, sodium chloride flush  Micro Results No results found for this or any previous visit (from the past 240 hour(s)).  Radiology Reports Dg Chest 1 View  Result Date: 01/30/2017 CLINICAL DATA:  Dyspnea EXAM: CHEST 1 VIEW COMPARISON:  None. FINDINGS: Rotated patient. Minimal basilar atelectasis. No acute consolidation or effusion. Borderline cardiomegaly. No pneumothorax. IMPRESSION: Minimal bibasilar atelectasis Electronically Signed   By: Donavan Foil M.D.   On: 01/30/2017 20:36   Ct Angio Chest Pe W And/or Wo Contrast  Result Date: 01/27/2017 CLINICAL DATA:  Chest pain starting 3 days ago.  Headaches. Elevated D-dimer. Intermediate clinical probability for pulmonary embolus. EXAM: CT ANGIOGRAPHY CHEST WITH CONTRAST TECHNIQUE: Multidetector CT imaging of the chest was performed using the standard protocol during bolus administration of intravenous contrast. Multiplanar CT image reconstructions and MIPs were obtained to evaluate the vascular anatomy. CONTRAST:  100 mL Isovue 370 COMPARISON:  None. FINDINGS: Cardiovascular: Satisfactory opacification of the pulmonary arteries to the segmental level. No evidence of pulmonary embolism. Normal heart size. No pericardial effusion. Normal caliber thoracic aorta. No evidence of aortic dissection. Great vessel origins are patent. Scattered calcifications in the aorta and coronary arteries. Mediastinum/Nodes: No enlarged mediastinal, hilar, or axillary lymph nodes. Thyroid gland, trachea, and esophagus demonstrate no significant findings. Lungs/Pleura: Lungs are clear. No pleural effusion or pneumothorax.  Upper Abdomen: 2 cm diameter left adrenal gland nodule. Fat attenuation measurements consistent with benign adenoma. Musculoskeletal: Degenerative changes in the spine. No destructive bone lesions. Review of the MIP images confirms the above findings. IMPRESSION: 1. No evidence of significant pulmonary embolus. 2. No evidence of active pulmonary disease. 3. Aortic atherosclerosis. 4. Benign-appearing left adrenal gland nodule likely representing an adenoma. Aortic Atherosclerosis (ICD10-I70.0). Electronically Signed   By: Lucienne Capers M.D.   On: 01/27/2017 06:27   Dg Chest Port 1 View  Result Date: 01/27/2017 CLINICAL DATA:  Chest pain for 3 days EXAM: PORTABLE CHEST 1 VIEW COMPARISON:  Chest radiograph 12/01/2016 FINDINGS: Patient is rotated to the right. Cardiomegaly is unchanged. There is no focal airspace consolidation or pulmonary edema. No sizable pleural effusion. IMPRESSION: Unchanged cardiomegaly, again probably exaggerated by the degree of  rotation. No focal airspace disease or pulmonary edema. Electronically Signed   By: Ulyses Jarred M.D.   On: 01/27/2017 02:49    Time Spent in minutes  30   [name] M.D on 01/31/2017 at 6:21 AM  Between 7am to 7am - Pager - [phone]

## 2017-01-31 NOTE — Progress Notes (Signed)
Patient with multiple full thick ness wounds to pannus. Wound to right side of pannus measures 2x1x0.2 cm. Base of wound pale red and dry. Xeroform gauze and dry 2x2 and tegaderm as per patient request because of tape allergy. To the medial section of pannus, wound measures 3x4.5x2cm woth pale red tissue to base and draining scant amount of grayish drainage. Xeroform gauze applied, dry 4x4 and tegaderm. The left side of pannus has a 1.5x3x0.2 cm full thickness wound with pale dry red base. Xeroform gauze applied, dry 2x2 and tegaderm.

## 2017-02-01 LAB — IRON AND TIBC
Iron: 44 ug/dL (ref 28–170)
Saturation Ratios: 13 % (ref 10.4–31.8)
TIBC: 333 ug/dL (ref 250–450)
UIBC: 289 ug/dL

## 2017-02-01 LAB — CBC
HCT: 30.5 % — ABNORMAL LOW (ref 36.0–46.0)
Hemoglobin: 9.2 g/dL — ABNORMAL LOW (ref 12.0–15.0)
MCH: 25.6 pg — ABNORMAL LOW (ref 26.0–34.0)
MCHC: 30.2 g/dL (ref 30.0–36.0)
MCV: 84.7 fL (ref 78.0–100.0)
Platelets: 270 10*3/uL (ref 150–400)
RBC: 3.6 MIL/uL — ABNORMAL LOW (ref 3.87–5.11)
RDW: 15.8 % — ABNORMAL HIGH (ref 11.5–15.5)
WBC: 6.2 10*3/uL (ref 4.0–10.5)

## 2017-02-01 LAB — COMPREHENSIVE METABOLIC PANEL
ALT: 12 U/L — ABNORMAL LOW (ref 14–54)
AST: 16 U/L (ref 15–41)
Albumin: 3 g/dL — ABNORMAL LOW (ref 3.5–5.0)
Alkaline Phosphatase: 79 U/L (ref 38–126)
Anion gap: 9 (ref 5–15)
BUN: 18 mg/dL (ref 6–20)
CO2: 30 mmol/L (ref 22–32)
Calcium: 8.4 mg/dL — ABNORMAL LOW (ref 8.9–10.3)
Chloride: 101 mmol/L (ref 101–111)
Creatinine, Ser: 1.09 mg/dL — ABNORMAL HIGH (ref 0.44–1.00)
GFR calc Af Amer: 59 mL/min — ABNORMAL LOW (ref >60)
GFR calc non Af Amer: 51 mL/min — ABNORMAL LOW (ref >60)
Glucose, Bld: 168 mg/dL — ABNORMAL HIGH (ref 65–99)
Potassium: 4.1 mmol/L (ref 3.5–5.1)
Sodium: 140 mmol/L (ref 135–145)
Total Bilirubin: 0.4 mg/dL (ref 0.3–1.2)
Total Protein: 6 g/dL — ABNORMAL LOW (ref 6.5–8.1)

## 2017-02-01 LAB — GLUCOSE, CAPILLARY
Glucose-Capillary: 143 mg/dL — ABNORMAL HIGH (ref 65–99)
Glucose-Capillary: 112 mg/dL — ABNORMAL HIGH (ref 65–99)
Glucose-Capillary: 224 mg/dL — ABNORMAL HIGH (ref 65–99)
Glucose-Capillary: 196 mg/dL — ABNORMAL HIGH (ref 65–99)

## 2017-02-01 LAB — VITAMIN B12: Vitamin B-12: 249 pg/mL (ref 180–914)

## 2017-02-01 LAB — FERRITIN: Ferritin: 8 ng/mL — ABNORMAL LOW (ref 11–307)

## 2017-02-01 MED ORDER — ONDANSETRON HCL 4 MG/2ML IJ SOLN
4.0000 mg | Freq: Four times a day (QID) | INTRAMUSCULAR | Status: DC | PRN
  Administered 2017-02-01 – 2017-02-04 (×7): 4 mg via INTRAVENOUS
  Filled 2017-02-01 (×7): qty 2

## 2017-02-01 MED ORDER — PRO-STAT SUGAR FREE PO LIQD
30.0000 mL | Freq: Two times a day (BID) | ORAL | Status: DC
  Administered 2017-02-01 – 2017-02-05 (×5): 30 mL via ORAL
  Filled 2017-02-01 (×9): qty 30

## 2017-02-01 MED ORDER — GABAPENTIN 300 MG PO CAPS
300.0000 mg | ORAL_CAPSULE | Freq: Three times a day (TID) | ORAL | Status: DC
  Administered 2017-02-01 – 2017-02-05 (×12): 300 mg via ORAL
  Filled 2017-02-01 (×12): qty 1

## 2017-02-01 MED ORDER — ALBUTEROL SULFATE (2.5 MG/3ML) 0.083% IN NEBU
2.5000 mg | INHALATION_SOLUTION | Freq: Two times a day (BID) | RESPIRATORY_TRACT | Status: DC
  Administered 2017-02-02 – 2017-02-04 (×5): 2.5 mg via RESPIRATORY_TRACT
  Filled 2017-02-01 (×5): qty 3

## 2017-02-01 NOTE — Consult Note (Signed)
[place] Nurse wound consult note Reason for Consult: Intertriginous dermatitis with full thickness wounds.  See over weekend by [facility] Nurse Rhae Hammock.  Her interventions are appreciated. Wound type:Intertriginous dermatitis, moisture plus friction and pressure Pressure Injury POA: N/A Measurement: three (3) distinct, full thickness wounds:  Right, central and left. Central wound is the largest and deepest Right pannicular:  0.4cm x 2cm x 0.2cm with pink, moist wound bed. Small amount of light yellow exudate. Central pannicular:  3cm x 4cm x 0.4cm with undermining at 6 o'clock measuring 2cm. Red wound bed with 20% yellow slouch, 80% red tissue.  Moist, moderate light yellow exudate. Left pannicular:  1cm x 3cm x 0.2cm red, moist wound bed with recently healed satellite lesions. Small amount of light yellow exudate. Wound bed:As described above Drainage (amount, consistency, odor) As described above Periwound: Some medical adhesive related skin injury (MARSI) due to repeated dressing removal with pitted appearance of subcutaneous adipose Dressing procedure/placement/frequency: I will provide patient with a bariatric bed due to her body habitus (wider width than standard mattress accomodates despite meeting weight requirements).  She does not use the trapeze, so I will discontinue that.  We will provide local wound care using a calcium alginate to enhance wound recovery and allow for once daily dressing changes rather than twice daily. Hopefully this will decrease the MARSI. We will provide our house antimicrobial wicking textile (InterDry Ag+) for her use while in house and this will decrease moisture accumulation in her vulnerable area. [place] nursing team will not follow, but will remain available to this patient, the nursing and medical teams.  Please re-consult if needed. Thanks, Maudie Flakes, MSN, RN, [place], Arther Abbott  Pager# [phone]

## 2017-02-01 NOTE — Progress Notes (Signed)
Wounds to pannus cleaned and dressed with xeroform gauze, dry gauze and secure with transparent tape. Wound to lateral sides of pannus lean and pale red with no drainage noted. Wound to midline with scant thick grayish drainage and wound base remains pale red. For wound care consult.

## 2017-02-01 NOTE — Progress Notes (Signed)
Patient ID: Stephanie Combs, female   DOB: February 05, 1951, 66 y.o.   MRN: [id number]                                                                PROGRESS NOTE                                                                                                                                                                                                             Patient Demographics:    Stephanie Combs, is a 66 y.o. female, DOB - 1951/04/06, IEP:[id number]  Admit date - 01/30/2017   Admitting Physician [name], MD  Outpatient Primary MD for the patient is [name], MD  LOS - 2  Outpatient Specialists:    No chief complaint on file.    Skin ulcer  Brief Narrative  [place] y.o.female,w Dm2, hypertension, morbid obeisity, hypothyroidism, CHF (EF 40-45%), Copd, c/o increase in size of skin ulcer w yellow discharge as well as increase in dyspnea and orthopnea, and slight leg swelling. + weight gain. Pt denies fever, chills, cough, cp, palp, n/v, diarrhea, dysuria, hematuria. Her endocrinologistphysician has recommended inpatient admission with iv abx. Pt was seen today in office and sent for direct admission for large abdominal wound as well as w/up of dyspnea.    Subjective:    [name] today states that her wound still has yellow discharge.  Pt afebrile.  Dyspnea improving slowly with iv diuresis.   No headache, No chest pain, No abdominal pain - No Nausea, No new weakness tingling or numbness, No Cough    Assessment  & Plan :    Principal Problem:   Skin ulcer ([place]) Active Problems:   DM type 2 (diabetes mellitus, type 2) (HCC)   CKD (chronic kidney disease), stage III   Chronic diastolic CHF (congestive heart failure) (HCC)   Dyspnea   Bilateral edema of lower extremity   Skin ulcer,  Wound culture pending Cont vanco iv, rocephin iv Wound care appreciated  Check cbc in am  Dyspnea, Chronic CHF, Bilateral lower ext edema.  Cont lasix 40mg  iv  bid  Constipation Fleet enema prn  Muscle spasm Tizanidine 2mg  po q6h prn   CKD stage3 Check cmp in am  Anemia Check b12, folate, ferritin, iron , tibc, spep pending  DM2 (Hga1c=7.5) Cont fsbs ac and qhs, ISS  Diabetic neuropathy Start gabapentin, monitor to see if worsens edema  Severe Protein Calorie malnutrition Start prostat    Code Status : FULL CODE  Family Communication  : w patient  Disposition Plan  : home  Barriers For Discharge :   Consults  :  Wound care  Procedures  :   DVT Prophylaxis  :  Lovenox -  SCD  Antibiotics  :  vanco/rocephin     Lab Results  Component Value Date   PLT 270 02/01/2017    Anti-infectives    Start     Dose/Rate Route Frequency Ordered Stop   01/31/17 2200  vancomycin (VANCOCIN) 1,500 mg in sodium chloride 0.9 % 500 mL IVPB     1,500 mg 250 mL/hr over 120 Minutes Intravenous Every 24 hours 01/30/17 1957     01/30/17 2200  cefTRIAXone (ROCEPHIN) 1 g in dextrose 5 % 50 mL IVPB  Status:  Discontinued     1 g 100 mL/hr over 30 Minutes Intravenous Every 24 hours 01/30/17 1938 01/30/17 1950   01/30/17 2100  vancomycin (VANCOCIN) 2,000 mg in sodium chloride 0.9 % 500 mL IVPB     2,000 mg 250 mL/hr over 120 Minutes Intravenous  Once 01/30/17 1949 01/31/17 0006   01/30/17 2030  cefTRIAXone (ROCEPHIN) 2 g in dextrose 5 % 50 mL IVPB     2 g 100 mL/hr over 30 Minutes Intravenous Every 24 hours 01/30/17 1950          Objective:   Vitals:   01/31/17 1404 01/31/17 1428 01/31/17 2023 01/31/17 2100  BP: (!) 159/64   (!) 144/92  Pulse: 75   84  Resp: 18   20  Temp: 98.4 F (36.9 C)   98.5 F (36.9 C)  TempSrc: Oral     SpO2: 99% 98% 100% 98%  Weight:      Height:        Wt Readings from Last 3 Encounters:  01/31/17 (!) 136.6 kg (301 lb 2.4 oz)  01/26/17 (!) 141.1 kg (311 lb)  12/01/16 (!) 145.6 kg (321 lb)     Intake/Output Summary (Last 24 hours) at 02/01/17 0615 Last data filed at 01/31/17  2101  Gross per 24 hour  Intake              840 ml  Output             3450 ml  Net            -2610 ml     Physical Exam  Awake Alert, Oriented X 3, No new F.N deficits, Normal affect [place].AT,PERRAL Supple Neck,No JVD, No cervical lymphadenopathy appriciated.  Symmetrical Chest wall movement, Good air movement bilaterally, slight crackle, bilateral lung base, no wheezing,  RRR,No Gallops,Rubs or new Murmurs, No Parasternal Heave +ve B.Sounds, Abd Soft, No tenderness, No organomegaly appriciated, No rebound - guarding or rigidity. No Cyanosis, Clubbing , trace-1+ edema + lymphedema Multiple shallow skin ulcers on the left lateral abdomen largest about 2.5x3 cm round.  Large 7cm skin ulcer below mid abdomen    Data Review:    CBC  Recent Labs Lab 01/27/17 0109 01/30/17 1816 02/01/17 0528  WBC 8.0 7.8 6.2  HGB 10.9* 10.4* 9.2*  HCT 35.3* 34.1* 30.5*  PLT 333 298 270  MCV 85.5 84.4 84.7  MCH 26.4 25.7* 25.6*  MCHC 30.9 30.5 30.2  RDW 15.7* 15.8* 15.8*  LYMPHSABS 1.9  --   --  MONOABS 0.9  --   --   EOSABS 0.2  --   --   BASOSABS 0.1  --   --     Chemistries   Recent Labs Lab 01/27/17 0109 01/30/17 1816  NA 138 138  K 3.7 4.2  CL 101 101  CO2 27 28  GLUCOSE 148* 208*  BUN 25* 19  CREATININE 1.09* 1.17*  CALCIUM 9.1 8.7*  AST 15 27  ALT 13* 16  ALKPHOS 93 96  BILITOT 0.5 0.4   ------------------------------------------------------------------------------------------------------------------ No results for input(s): CHOL, HDL, LDLCALC, TRIG, CHOLHDL, LDLDIRECT in the last 72 hours.  Lab Results  Component Value Date   HGBA1C 7.5 (H) 01/30/2017   ------------------------------------------------------------------------------------------------------------------  Recent Labs  01/30/17 1816  TSH 1.525   ------------------------------------------------------------------------------------------------------------------ No results for input(s):  VITAMINB12, FOLATE, FERRITIN, TIBC, IRON, RETICCTPCT in the last 72 hours.  Coagulation profile No results for input(s): INR, PROTIME in the last 168 hours.  No results for input(s): DDIMER in the last 72 hours.  Cardiac Enzymes  Recent Labs Lab 01/27/17 0109 01/30/17 2014  TROPONINI <0.03 <0.03   ------------------------------------------------------------------------------------------------------------------    Component Value Date/Time   BNP 115.6 (H) 01/30/2017 1816    Inpatient Medications  Scheduled Meds: . albuterol  2.5 mg Nebulization TID  . aspirin EC  81 mg Oral Daily  . cholecalciferol  5,000 Units Oral Daily  . diltiazem  300 mg Oral Daily  . enoxaparin (LOVENOX) injection  0.5 mg/kg Subcutaneous Q24H  . famotidine  20 mg Oral BID  . furosemide  40 mg Intravenous BID  . insulin aspart  0-5 Units Subcutaneous QHS  . insulin aspart  0-9 Units Subcutaneous TID WC  . insulin aspart  10 Units Subcutaneous TID WC  . insulin detemir  50 Units Subcutaneous QHS  . levothyroxine  244 mcg Oral QAC breakfast  . liraglutide  1.2 mg Subcutaneous QPC breakfast  . nystatin   Topical BID  . potassium chloride  20 mEq Oral BID  . sodium chloride flush  3 mL Intravenous Q12H   Continuous Infusions: . sodium chloride    . cefTRIAXone (ROCEPHIN)  IV Stopped (01/31/17 2003)  . vancomycin Stopped (02/01/17 0103)   PRN Meds:.sodium chloride, acetaminophen **OR** acetaminophen, albuterol, ALPRAZolam, oxyCODONE-acetaminophen **AND** oxyCODONE, sodium chloride flush, sodium phosphate, tiZANidine  Micro Results No results found for this or any previous visit (from the past 240 hour(s)).  Radiology Reports Dg Chest 1 View  Result Date: 01/30/2017 CLINICAL DATA:  Dyspnea EXAM: CHEST 1 VIEW COMPARISON:  None. FINDINGS: Rotated patient. Minimal basilar atelectasis. No acute consolidation or effusion. Borderline cardiomegaly. No pneumothorax. IMPRESSION: Minimal bibasilar  atelectasis Electronically Signed   By: Donavan Foil M.D.   On: 01/30/2017 20:36   Ct Angio Chest Pe W And/or Wo Contrast  Result Date: 01/27/2017 CLINICAL DATA:  Chest pain starting 3 days ago. Headaches. Elevated D-dimer. Intermediate clinical probability for pulmonary embolus. EXAM: CT ANGIOGRAPHY CHEST WITH CONTRAST TECHNIQUE: Multidetector CT imaging of the chest was performed using the standard protocol during bolus administration of intravenous contrast. Multiplanar CT image reconstructions and MIPs were obtained to evaluate the vascular anatomy. CONTRAST:  100 mL Isovue 370 COMPARISON:  None. FINDINGS: Cardiovascular: Satisfactory opacification of the pulmonary arteries to the segmental level. No evidence of pulmonary embolism. Normal heart size. No pericardial effusion. Normal caliber thoracic aorta. No evidence of aortic dissection. Great vessel origins are patent. Scattered calcifications in the aorta and coronary arteries. Mediastinum/Nodes: No enlarged mediastinal, hilar, or axillary  lymph nodes. Thyroid gland, trachea, and esophagus demonstrate no significant findings. Lungs/Pleura: Lungs are clear. No pleural effusion or pneumothorax. Upper Abdomen: 2 cm diameter left adrenal gland nodule. Fat attenuation measurements consistent with benign adenoma. Musculoskeletal: Degenerative changes in the spine. No destructive bone lesions. Review of the MIP images confirms the above findings. IMPRESSION: 1. No evidence of significant pulmonary embolus. 2. No evidence of active pulmonary disease. 3. Aortic atherosclerosis. 4. Benign-appearing left adrenal gland nodule likely representing an adenoma. Aortic Atherosclerosis (ICD10-I70.0). Electronically Signed   By: Lucienne Capers M.D.   On: 01/27/2017 06:27   Dg Chest Port 1 View  Result Date: 01/27/2017 CLINICAL DATA:  Chest pain for 3 days EXAM: PORTABLE CHEST 1 VIEW COMPARISON:  Chest radiograph 12/01/2016 FINDINGS: Patient is rotated to the right.  Cardiomegaly is unchanged. There is no focal airspace consolidation or pulmonary edema. No sizable pleural effusion. IMPRESSION: Unchanged cardiomegaly, again probably exaggerated by the degree of rotation. No focal airspace disease or pulmonary edema. Electronically Signed   By: Ulyses Jarred M.D.   On: 01/27/2017 02:49    Time Spent in minutes  30   [name] M.D on 02/01/2017 at 6:15 AM  Between 7am to 7am - Pager - [phone]

## 2017-02-01 NOTE — Plan of Care (Signed)
Problem: Skin Integrity: Goal: Risk for impaired skin integrity will decrease Outcome: Progressing Practice good skin care to especially pannus area/. Keep area clean and dry and continue nystatin powder as ordered

## 2017-02-02 LAB — CREATININE, SERUM
Creatinine, Ser: 1.27 mg/dL — ABNORMAL HIGH (ref 0.44–1.00)
GFR calc Af Amer: 49 mL/min — ABNORMAL LOW (ref >60)
GFR calc non Af Amer: 43 mL/min — ABNORMAL LOW (ref >60)

## 2017-02-02 LAB — HEPATIC FUNCTION PANEL
ALT: 12 U/L — ABNORMAL LOW (ref 14–54)
AST: 17 U/L (ref 15–41)
Albumin: 3 g/dL — ABNORMAL LOW (ref 3.5–5.0)
Alkaline Phosphatase: 80 U/L (ref 38–126)
Bilirubin, Direct: <0.1 mg/dL — ABNORMAL LOW (ref 0.1–0.5)
Total Bilirubin: 0.4 mg/dL (ref 0.3–1.2)
Total Protein: 5.9 g/dL — ABNORMAL LOW (ref 6.5–8.1)

## 2017-02-02 LAB — FOLATE RBC
Folate, Hemolysate: 484.4 ng/mL
Folate, RBC: 1563 ng/mL (ref >498)
Hematocrit: 31 % — ABNORMAL LOW (ref 34.0–46.6)

## 2017-02-02 LAB — GLUCOSE, CAPILLARY
Glucose-Capillary: 214 mg/dL — ABNORMAL HIGH (ref 65–99)
Glucose-Capillary: 102 mg/dL — ABNORMAL HIGH (ref 65–99)
Glucose-Capillary: 116 mg/dL — ABNORMAL HIGH (ref 65–99)
Glucose-Capillary: 155 mg/dL — ABNORMAL HIGH (ref 65–99)

## 2017-02-02 LAB — VANCOMYCIN, TROUGH: Vancomycin Tr: 23 ug/mL (ref 15–20)

## 2017-02-02 LAB — POTASSIUM: Potassium: 4.1 mmol/L (ref 3.5–5.1)

## 2017-02-02 NOTE — Progress Notes (Signed)
Patient ID: Stephanie Combs, female   DOB: 06-19-1950, 66 y.o.   MRN: [id number]                                                                PROGRESS NOTE                                                                                                                                                                                                             Patient Demographics:    Stephanie Combs, is a 66 y.o. female, DOB - [birth year], [id number]:[id number]  Admit date - 01/30/2017   Admitting Physician [name], MD  Outpatient Primary MD for the patient is [name], MD  LOS - 3  Outpatient Specialists:  No chief complaint on file.    Skin ulcer  Brief Narrative   66 y.o.female,w Dm2, hypertension, morbid obeisity, hypothyroidism, CHF (EF 40-45%), Copd, c/o increase in size of skin ulcer w yellow discharge as well as increase in dyspnea and orthopnea, and slight leg swelling. + weight gain. Pt denies fever, chills, cough, cp, palp, n/v, diarrhea, dysuria, hematuria. Her endocrinologistphysician has recommended inpatient admission with iv abx. Pt was seen today in office and sent for direct admission for large abdominal wound as well as w/up of dyspnea.    Subjective:    Stephanie Combs today has skin ulcer has less yellow drainage.  Slight dyspnea but improving.   No headache, No chest pain, No abdominal pain - No Nausea, No new weakness tingling or numbness, No Cough -   Assessment  & Plan :    Principal Problem:   Skin ulcer ([place]) Active Problems:   DM type 2 (diabetes mellitus, type 2) (HCC)   CKD (chronic kidney disease), stage III   Chronic diastolic CHF (congestive heart failure) (HCC)   Dyspnea   Bilateral edema of lower extremity   Skin ulcer, improving Wound culture pending Cont vanco iv, rocephin iv Wound care appreciated  Check cbc in am  Dyspnea, Chronic CHF, Bilateral lower ext edema.  Cont lasix 40mg  iv bid  Constipation Fleet enema prn  Muscle  spasm Tizanidine 2mg  po q6h prn   CKD stage3 Check cmp in am  Anemia Check b12, folate, ferritin, iron , tibc, spep pending  DM2 (Hga1c=7.5) Cont fsbs ac and qhs,  ISS  Diabetic neuropathy Cont  gabapentin, monitor to see if worsens edema  Severe Protein Calorie malnutrition Cont prostat    Code Status:FULL CODE  Family Communication :w patient  Disposition Plan:home  Barriers For Discharge:  Consults :Wound care  Procedures :   DVT Prophylaxis: Lovenox- SCD  Antibiotics :vanco/rocephin     Lab Results  Component Value Date   PLT 270 02/01/2017      Anti-infectives    Start     Dose/Rate Route Frequency Ordered Stop   01/31/17 2200  vancomycin (VANCOCIN) 1,500 mg in sodium chloride 0.9 % 500 mL IVPB     1,500 mg 250 mL/hr over 120 Minutes Intravenous Every 24 hours 01/30/17 1957     01/30/17 2200  cefTRIAXone (ROCEPHIN) 1 g in dextrose 5 % 50 mL IVPB  Status:  Discontinued     1 g 100 mL/hr over 30 Minutes Intravenous Every 24 hours 01/30/17 1938 01/30/17 1950   01/30/17 2100  vancomycin (VANCOCIN) 2,000 mg in sodium chloride 0.9 % 500 mL IVPB     2,000 mg 250 mL/hr over 120 Minutes Intravenous  Once 01/30/17 1949 01/31/17 0006   01/30/17 2030  cefTRIAXone (ROCEPHIN) 2 g in dextrose 5 % 50 mL IVPB     2 g 100 mL/hr over 30 Minutes Intravenous Every 24 hours 01/30/17 1950          Objective:   Vitals:   02/01/17 1453 02/01/17 2015 02/01/17 2241 02/02/17 0528  BP:   (!) 120/40 (!) 109/42  Pulse:   72 75  Resp:   14 18  Temp:   98.4 F (36.9 C) 98.3 F (36.8 C)  TempSrc:   Oral Oral  SpO2: 98% 96% 98% 99%  Weight:    133.4 kg (294 lb 1.5 oz)  Height:        Wt Readings from Last 3 Encounters:  02/02/17 133.4 kg (294 lb 1.5 oz)  01/26/17 (!) 141.1 kg (311 lb)  12/01/16 (!) 145.6 kg (321 lb)     Intake/Output Summary (Last 24 hours) at 02/02/17 0728 Last data filed at 02/02/17 0550  Gross per 24  hour  Intake             1870 ml  Output             3850 ml  Net            -1980 ml     Physical Exam  Awake Alert, Oriented X 3, No new F.N deficits, Normal affect [address] [place] [address]AT,PERRAL Supple Neck,No JVD, No cervical lymphadenopathy appriciated.  Symmetrical Chest wall movement, Good air movement bilaterally, slight crackle, bilateral lung base, no wheezing,  RRR,No Gallops,Rubs or new Murmurs, No Parasternal Heave +ve B.Sounds, Abd Soft, Notenderness, No organomegaly appriciated,No rebound -guarding or rigidity. No Cyanosis, Clubbing , trace-1+ edema + lymphedema Multiple shallow skin ulcers on the left lateral abdomen largest about 2.5x3 cm round.  Large 7cm skin ulcer below mid abdomen slight yellow discharge    Data Review:    CBC  Recent Labs Lab 01/27/17 0109 01/30/17 1816 02/01/17 0528  WBC 8.0 7.8 6.2  HGB 10.9* 10.4* 9.2*  HCT 35.3* 34.1* 30.5*  PLT 333 298 270  MCV 85.5 84.4 84.7  MCH 26.4 25.7* 25.6*  MCHC 30.9 30.5 30.2  RDW 15.7* 15.8* 15.8*  LYMPHSABS 1.9  --   --   MONOABS 0.9  --   --   EOSABS 0.2  --   --  BASOSABS 0.1  --   --     Chemistries   Recent Labs Lab 01/27/17 0109 01/30/17 1816 02/01/17 0528 02/02/17 0520  NA 138 138 140  --   K 3.7 4.2 4.1  --   CL 101 101 101  --   CO2 27 28 30   --   GLUCOSE 148* 208* 168*  --   BUN 25* 19 18  --   CREATININE 1.09* 1.17* 1.09* 1.27*  CALCIUM 9.1 8.7* 8.4*  --   AST 15 27 16   --   ALT 13* 16 12*  --   ALKPHOS 93 96 79  --   BILITOT 0.5 0.4 0.4  --    ------------------------------------------------------------------------------------------------------------------ No results for input(s): CHOL, HDL, LDLCALC, TRIG, CHOLHDL, LDLDIRECT in the last 72 hours.  Lab Results  Component Value Date   HGBA1C 7.5 (H) 01/30/2017   ------------------------------------------------------------------------------------------------------------------  Recent Labs  01/30/17 1816  TSH 1.525    ------------------------------------------------------------------------------------------------------------------  Recent Labs  02/01/17 0528  VITAMINB12 249  FERRITIN 8*  TIBC 333  IRON 44    Coagulation profile No results for input(s): INR, PROTIME in the last 168 hours.  No results for input(s): DDIMER in the last 72 hours.  Cardiac Enzymes  Recent Labs Lab 01/27/17 0109 01/30/17 2014  TROPONINI <0.03 <0.03   ------------------------------------------------------------------------------------------------------------------    Component Value Date/Time   BNP 115.6 (H) 01/30/2017 1816    Inpatient Medications  Scheduled Meds: . albuterol  2.5 mg Nebulization BID  . aspirin EC  81 mg Oral Daily  . cholecalciferol  5,000 Units Oral Daily  . diltiazem  300 mg Oral Daily  . enoxaparin (LOVENOX) injection  0.5 mg/kg Subcutaneous Q24H  . famotidine  20 mg Oral BID  . feeding supplement (PRO-STAT SUGAR FREE 64)  30 mL Oral BID  . furosemide  40 mg Intravenous BID  . gabapentin  300 mg Oral TID  . insulin aspart  0-5 Units Subcutaneous QHS  . insulin aspart  0-9 Units Subcutaneous TID WC  . insulin aspart  10 Units Subcutaneous TID WC  . insulin detemir  50 Units Subcutaneous QHS  . levothyroxine  244 mcg Oral QAC breakfast  . liraglutide  1.2 mg Subcutaneous QPC breakfast  . nystatin   Topical BID  . potassium chloride  20 mEq Oral BID  . sodium chloride flush  3 mL Intravenous Q12H   Continuous Infusions: . sodium chloride    . cefTRIAXone (ROCEPHIN)  IV Stopped (02/01/17 2101)  . vancomycin Stopped (02/02/17 0059)   PRN Meds:.sodium chloride, acetaminophen **OR** acetaminophen, albuterol, ALPRAZolam, ondansetron (ZOFRAN) IV, oxyCODONE-acetaminophen **AND** oxyCODONE, sodium chloride flush, sodium phosphate, tiZANidine  Micro Results No results found for this or any previous visit (from the past 240 hour(s)).  Radiology Reports Dg Chest 1 View  Result  Date: 01/30/2017 CLINICAL DATA:  Dyspnea EXAM: CHEST 1 VIEW COMPARISON:  None. FINDINGS: Rotated patient. Minimal basilar atelectasis. No acute consolidation or effusion. Borderline cardiomegaly. No pneumothorax. IMPRESSION: Minimal bibasilar atelectasis Electronically Signed   By: Donavan Foil M.D.   On: 01/30/2017 20:36   Ct Angio Chest Pe W And/or Wo Contrast  Result Date: 01/27/2017 CLINICAL DATA:  Chest pain starting 3 days ago. Headaches. Elevated D-dimer. Intermediate clinical probability for pulmonary embolus. EXAM: CT ANGIOGRAPHY CHEST WITH CONTRAST TECHNIQUE: Multidetector CT imaging of the chest was performed using the standard protocol during bolus administration of intravenous contrast. Multiplanar CT image reconstructions and MIPs were obtained to evaluate the vascular anatomy.  CONTRAST:  100 mL Isovue 370 COMPARISON:  None. FINDINGS: Cardiovascular: Satisfactory opacification of the pulmonary arteries to the segmental level. No evidence of pulmonary embolism. Normal heart size. No pericardial effusion. Normal caliber thoracic aorta. No evidence of aortic dissection. Great vessel origins are patent. Scattered calcifications in the aorta and coronary arteries. Mediastinum/Nodes: No enlarged mediastinal, hilar, or axillary lymph nodes. Thyroid gland, trachea, and esophagus demonstrate no significant findings. Lungs/Pleura: Lungs are clear. No pleural effusion or pneumothorax. Upper Abdomen: 2 cm diameter left adrenal gland nodule. Fat attenuation measurements consistent with benign adenoma. Musculoskeletal: Degenerative changes in the spine. No destructive bone lesions. Review of the MIP images confirms the above findings. IMPRESSION: 1. No evidence of significant pulmonary embolus. 2. No evidence of active pulmonary disease. 3. Aortic atherosclerosis. 4. Benign-appearing left adrenal gland nodule likely representing an adenoma. Aortic Atherosclerosis (ICD10-I70.0). Electronically Signed   By:  Lucienne Capers M.D.   On: 01/27/2017 06:27   Dg Chest Port 1 View  Result Date: 01/27/2017 CLINICAL DATA:  Chest pain for 3 days EXAM: PORTABLE CHEST 1 VIEW COMPARISON:  Chest radiograph 12/01/2016 FINDINGS: Patient is rotated to the right. Cardiomegaly is unchanged. There is no focal airspace consolidation or pulmonary edema. No sizable pleural effusion. IMPRESSION: Unchanged cardiomegaly, again probably exaggerated by the degree of rotation. No focal airspace disease or pulmonary edema. Electronically Signed   By: Ulyses Jarred M.D.   On: 01/27/2017 02:49    Time Spent in minutes  30   [name] M.D on 02/02/2017 at 7:28 AM  Between 7am to 7am - Pager - [phone]

## 2017-02-02 NOTE — Care Management Important Message (Signed)
Important Message  Patient Details  Name: Stephanie Combs MRN: [id number] Date of Birth: 14-Apr-1951   Medicare Important Message Given:  Yes    Kerin Salen 02/02/2017, 12:05 [place] Message  Patient Details  Name: Stephanie Combs MRN: [id number] Date of Birth: Sep 16, 1950   Medicare Important Message Given:  Yes    Kerin Salen 02/02/2017, 12:05 PM

## 2017-02-02 NOTE — Progress Notes (Signed)
Brief Pharmacy Note:    See note by Reuel Boom, PharmD, for full details. Briefly, this is a 25 y/oF on Vancomycin and Ceftriaxone for wound infection/skin ulcer.    Vancomycin trough level this PM = 23 mcg/mL, supratherapeutic on Vancomycin 1500mg  IV q24h.   SCr increased to 1.27 today  Specimen for wound culture does not appear to have been collected   Plan:  Hold Vancomycin tonight due to elevated trough level.  Check random Vancomycin level tomorrow morning. Resume Vancomycin when level <= 15 mcg/mL.   Daily SCr.   Consider de-escalation of antibiotics as clinically appropriate.    Lindell Spar, PharmD, BCPS Pager: [phone] 02/02/2017 10:41 PM

## 2017-02-02 NOTE — Progress Notes (Signed)
Pharmacy Antibiotic Note  Stephanie Combs is a 66 y.o. female admitted on 01/30/2017 with abdominal wound.  Pharmacy has been consulted for vancomycin dosing. MD is dosing ceftriaxone.  Today, 02/02/2017:  MD note states awaiting wound culture results but doesn't appear to have been collected  SCr back up today  WBC wnl, remains afebrile  Plan:  Continue Vancomycin 2g IV x 1, then 1500mg  IV q24h  Will check vanc trough tonight, goal 10-15 mcg/ml  Ceftriaxone 2g IV q24h (per MD)  Follow up renal function & cultures   Height: 5\' 6"  (167.6 cm) Weight: 294 lb 1.5 oz (133.4 kg) IBW/kg (Calculated) : 59.3  Temp (24hrs), Avg:98.4 F (36.9 C), Min:98.3 F (36.8 C), Max:98.6 F (37 C)   Recent Labs Lab 01/27/17 0109 01/30/17 1816 02/01/17 0528 02/02/17 0520  WBC 8.0 7.8 6.2  --   CREATININE 1.09* 1.17* 1.09* 1.27*    Estimated Creatinine Clearance: 60.3 mL/min (A) (by C-G formula based on SCr of 1.27 mg/dL (H)).    Allergies  Allergen Reactions  . Dilaudid [Hydromorphone Hcl] Itching  . Actifed Cold-Allergy [Chlorpheniramine-Phenylephrine]     "I was sick and red and it didn't agree with me at all"  . Doxycycline Nausea And Vomiting  . Other Cough    Pt states she is allergic to ragweed and that she starts coughing and sneezing like crazy  . Penicillins Hives and Itching    Has patient had a PCN reaction causing immediate rash, facial/tongue/throat swelling, SOB or lightheadedness with hypotension: Yes Has patient had a PCN reaction causing severe rash involving mucus membranes or skin necrosis: No Has patient had a PCN reaction that required hospitalization Yes Has patient had a PCN reaction occurring within the last 10 years: No If all of the above answers are "NO", then may proceed with Cephalosporin use.   . Reglan [Metoclopramide] Itching  . Valium [Diazepam] Itching  . Vistaril [Hydroxyzine Hcl] Itching     Thank you for allowing pharmacy to be a part of  this patient's care.  Reuel Boom, PharmD, BCPS Pager: [phone] 02/02/2017, 3:39 PM

## 2017-02-03 LAB — CBC
HCT: 28.8 % — ABNORMAL LOW (ref 36.0–46.0)
Hemoglobin: 8.9 g/dL — ABNORMAL LOW (ref 12.0–15.0)
MCH: 26.5 pg (ref 26.0–34.0)
MCHC: 30.9 g/dL (ref 30.0–36.0)
MCV: 85.7 fL (ref 78.0–100.0)
Platelets: 235 10*3/uL (ref 150–400)
RBC: 3.36 MIL/uL — ABNORMAL LOW (ref 3.87–5.11)
RDW: 15.8 % — ABNORMAL HIGH (ref 11.5–15.5)
WBC: 6.8 10*3/uL (ref 4.0–10.5)

## 2017-02-03 LAB — COMPREHENSIVE METABOLIC PANEL
ALT: 13 U/L — ABNORMAL LOW (ref 14–54)
AST: 15 U/L (ref 15–41)
Albumin: 2.8 g/dL — ABNORMAL LOW (ref 3.5–5.0)
Alkaline Phosphatase: 69 U/L (ref 38–126)
Anion gap: 6 (ref 5–15)
BUN: 26 mg/dL — ABNORMAL HIGH (ref 6–20)
CO2: 32 mmol/L (ref 22–32)
Calcium: 8.4 mg/dL — ABNORMAL LOW (ref 8.9–10.3)
Chloride: 102 mmol/L (ref 101–111)
Creatinine, Ser: 1.68 mg/dL — ABNORMAL HIGH (ref 0.44–1.00)
GFR calc Af Amer: 35 mL/min — ABNORMAL LOW (ref >60)
GFR calc non Af Amer: 30 mL/min — ABNORMAL LOW (ref >60)
Glucose, Bld: 136 mg/dL — ABNORMAL HIGH (ref 65–99)
Potassium: 4.5 mmol/L (ref 3.5–5.1)
Sodium: 140 mmol/L (ref 135–145)
Total Bilirubin: 0.4 mg/dL (ref 0.3–1.2)
Total Protein: 5.5 g/dL — ABNORMAL LOW (ref 6.5–8.1)

## 2017-02-03 LAB — GLUCOSE, CAPILLARY
Glucose-Capillary: 116 mg/dL — ABNORMAL HIGH (ref 65–99)
Glucose-Capillary: 101 mg/dL — ABNORMAL HIGH (ref 65–99)
Glucose-Capillary: 97 mg/dL (ref 65–99)
Glucose-Capillary: 155 mg/dL — ABNORMAL HIGH (ref 65–99)

## 2017-02-03 MED ORDER — FERROUS SULFATE 325 (65 FE) MG PO TABS
325.0000 mg | ORAL_TABLET | Freq: Every day | ORAL | Status: DC
  Administered 2017-02-03 – 2017-02-05 (×2): 325 mg via ORAL
  Filled 2017-02-03 (×3): qty 1

## 2017-02-03 MED ORDER — PROCHLORPERAZINE EDISYLATE 5 MG/ML IJ SOLN
10.0000 mg | Freq: Four times a day (QID) | INTRAMUSCULAR | Status: DC | PRN
  Administered 2017-02-03 (×2): 10 mg via INTRAVENOUS
  Filled 2017-02-03 (×2): qty 2

## 2017-02-03 NOTE — Progress Notes (Addendum)
Patient ID: Stephanie Combs, female   DOB: [birth year], 66 y.o.   MRN: [id number]                                                                PROGRESS NOTE                                                                                                                                                                                                             Patient Demographics:    Stephanie Combs, is a 66 y.o. female, DOB - 04/19/1951, YTK:[id number]  Admit date - 01/30/2017   Admitting Physician [name], MD  Outpatient Primary MD for the patient is [name], MD  LOS - 4  Outpatient Specialists:    No chief complaint on file. skin ulcer     Brief Narrative    Subjective:    Stephanie Combs today has been afebrile,  Skin ulcer has slight yellow drainage but appears to be less.  Slight dyspnea.  But improved.    No headache, No chest pain, No abdominal pain - No Nausea, No new weakness tingling or numbness,    Assessment  & Plan :    Principal Problem:   Skin ulcer ([place]) Active Problems:   DM type 2 (diabetes mellitus, type 2) (HCC)   CKD (chronic kidney disease), stage III   Chronic diastolic CHF (congestive heart failure) (HCC)   Dyspnea   Bilateral edema of lower extremity   DM type 2 (diabetes mellitus, type 2) (HCC)   CKD (chronic kidney disease), stage III   Chronic diastolic CHF (congestive heart failure) (HCC)   Dyspnea   Bilateral edema of lower extremity   Skin ulcer, improving Wound culture (there is an order), but apparently not collected, cont empiric abx Cont vanco iv, rocephin iv Wound care appreciated Check cbc in am  Dyspnea, Chronic CHF, Bilateral lower ext edema.  Hold Lasix today due to renal insufficiency  Constipation Fleet enema prn  Muscle spasm Tizanidine 2mg  po q6h prn   CKD stage3 Hold Lasix today Check cmp in am  Anemia Iron deficiency Start Ferrous sulfate 325mg  po qday  DM2 (Hga1c=7.5) Cont fsbs ac and qhs,  ISS  Diabetic neuropathy Cont  gabapentin, monitor to see if worsens edema  Severe Protein Calorie malnutrition Cont  prostat    Code Status:FULL CODE  Family Communication :w patient  Disposition Plan:home  Barriers For Discharge:  Consults :Wound care  Procedures :   DVT Prophylaxis: Lovenox- SCD  Antibiotics :vanco/rocephin  Lab Results  Component Value Date   PLT 235 02/03/2017    Anti-infectives    Start     Dose/Rate Route Frequency Ordered Stop   01/31/17 2200  vancomycin (VANCOCIN) 1,500 mg in sodium chloride 0.9 % 500 mL IVPB  Status:  Discontinued     1,500 mg 250 mL/hr over 120 Minutes Intravenous Every 24 hours 01/30/17 1957 02/02/17 2221   01/30/17 2200  cefTRIAXone (ROCEPHIN) 1 g in dextrose 5 % 50 mL IVPB  Status:  Discontinued     1 g 100 mL/hr over 30 Minutes Intravenous Every 24 hours 01/30/17 1938 01/30/17 1950   01/30/17 2100  vancomycin (VANCOCIN) 2,000 mg in sodium chloride 0.9 % 500 mL IVPB     2,000 mg 250 mL/hr over 120 Minutes Intravenous  Once 01/30/17 1949 01/31/17 0006   01/30/17 2030  cefTRIAXone (ROCEPHIN) 2 g in dextrose 5 % 50 mL IVPB     2 g 100 mL/hr over 30 Minutes Intravenous Every 24 hours 01/30/17 1950          Objective:   Vitals:   02/02/17 0939 02/02/17 1341 02/02/17 1930 02/02/17 2200  BP: 117/90 (!) 101/56  (!) 132/47  Pulse:  61  65  Resp:  18  18  Temp:  98.6 F (37 C)  98.6 F (37 C)  TempSrc:  Oral  Oral  SpO2:  96% 98% 99%  Weight:      Height:        Wt Readings from Last 3 Encounters:  02/02/17 133.4 kg (294 lb 1.5 oz)  01/26/17 (!) 141.1 kg (311 lb)  12/01/16 (!) 145.6 kg (321 lb)     Intake/Output Summary (Last 24 hours) at 02/03/17 0657 Last data filed at 02/03/17 0200  Gross per 24 hour  Intake             1193 ml  Output             3775 ml  Net            -2582 ml     Physical Exam  Awake Alert, Oriented X 3, No new F.N deficits, Normal  affect [place].AT,PERRAL Supple Neck,No JVD, No cervical lymphadenopathy appriciated.  Symmetrical Chest wall movement, Good air movement bilaterally, CTAB RRR,No Gallops,Rubs or new Murmurs, No Parasternal Heave +ve B.Sounds, Abd Soft, No tenderness, No organomegaly appriciated, No rebound - guarding or rigidity. No Cyanosis, Clubbing  trace-1+ edema + lymphedema Multiple shallow skin ulcers on the left lateral abdomen largest about 2.5x3 cm round.  Large 7cm skin ulcer below mid abdomen slight yellow discharge lessening    Data Review:    CBC  Recent Labs Lab 01/30/17 1816 02/01/17 0528 02/03/17 0551  WBC 7.8 6.2 6.8  HGB 10.4* 9.2* 8.9*  HCT 34.1* 30.5*  31.0* 28.8*  PLT 298 270 235  MCV 84.4 84.7 85.7  MCH 25.7* 25.6* 26.5  MCHC 30.5 30.2 30.9  RDW 15.8* 15.8* 15.8*    Chemistries   Recent Labs Lab 01/30/17 1816 02/01/17 0528 02/02/17 0520 02/03/17 0551  NA 138 140  --  140  K 4.2 4.1 4.1 4.5  CL 101 101  --  102  CO2 28 30  --  32  GLUCOSE 208* 168*  --  136*  BUN 19 18  --  26*  CREATININE 1.17* 1.09* 1.27* 1.68*  CALCIUM 8.7* 8.4*  --  8.4*  AST 27 16 17 15   ALT 16 12* 12* 13*  ALKPHOS 96 79 80 69  BILITOT 0.4 0.4 0.4 0.4   ------------------------------------------------------------------------------------------------------------------ No results for input(s): CHOL, HDL, LDLCALC, TRIG, CHOLHDL, LDLDIRECT in the last 72 hours.  Lab Results  Component Value Date   HGBA1C 7.5 (H) 01/30/2017   ------------------------------------------------------------------------------------------------------------------ No results for input(s): TSH, T4TOTAL, T3FREE, THYROIDAB in the last 72 hours.  Invalid input(s): FREET3 ------------------------------------------------------------------------------------------------------------------  Recent Labs  02/01/17 0528  VITAMINB12 249  FERRITIN 8*  TIBC 333  IRON 44    Coagulation profile No results for  input(s): INR, PROTIME in the last 168 hours.  No results for input(s): DDIMER in the last 72 hours.  Cardiac Enzymes  Recent Labs Lab 01/30/17 2014  TROPONINI <0.03   ------------------------------------------------------------------------------------------------------------------    Component Value Date/Time   BNP 115.6 (H) 01/30/2017 1816    Inpatient Medications  Scheduled Meds: . albuterol  2.5 mg Nebulization BID  . aspirin EC  81 mg Oral Daily  . cholecalciferol  5,000 Units Oral Daily  . diltiazem  300 mg Oral Daily  . enoxaparin (LOVENOX) injection  0.5 mg/kg Subcutaneous Q24H  . famotidine  20 mg Oral BID  . feeding supplement (PRO-STAT SUGAR FREE 64)  30 mL Oral BID  . furosemide  40 mg Intravenous BID  . gabapentin  300 mg Oral TID  . insulin aspart  0-5 Units Subcutaneous QHS  . insulin aspart  0-9 Units Subcutaneous TID WC  . insulin aspart  10 Units Subcutaneous TID WC  . insulin detemir  50 Units Subcutaneous QHS  . levothyroxine  244 mcg Oral QAC breakfast  . liraglutide  1.2 mg Subcutaneous QPC breakfast  . nystatin   Topical BID  . potassium chloride  20 mEq Oral BID  . sodium chloride flush  3 mL Intravenous Q12H   Continuous Infusions: . sodium chloride Stopped (02/02/17 1600)  . cefTRIAXone (ROCEPHIN)  IV Stopped (02/02/17 2235)   PRN Meds:.sodium chloride, acetaminophen **OR** acetaminophen, albuterol, ALPRAZolam, ondansetron (ZOFRAN) IV, oxyCODONE-acetaminophen **AND** oxyCODONE, sodium chloride flush, sodium phosphate, tiZANidine  Micro Results No results found for this or any previous visit (from the past 240 hour(s)).  Radiology Reports Dg Chest 1 View  Result Date: 01/30/2017 CLINICAL DATA:  Dyspnea EXAM: CHEST 1 VIEW COMPARISON:  None. FINDINGS: Rotated patient. Minimal basilar atelectasis. No acute consolidation or effusion. Borderline cardiomegaly. No pneumothorax. IMPRESSION: Minimal bibasilar atelectasis Electronically Signed   By:  Donavan Foil M.D.   On: 01/30/2017 20:36   Ct Angio Chest Pe W And/or Wo Contrast  Result Date: 01/27/2017 CLINICAL DATA:  Chest pain starting 3 days ago. Headaches. Elevated D-dimer. Intermediate clinical probability for pulmonary embolus. EXAM: CT ANGIOGRAPHY CHEST WITH CONTRAST TECHNIQUE: Multidetector CT imaging of the chest was performed using the standard protocol during bolus administration of intravenous contrast. Multiplanar CT image reconstructions and MIPs were obtained to evaluate the vascular anatomy. CONTRAST:  100 mL Isovue 370 COMPARISON:  None. FINDINGS: Cardiovascular: Satisfactory opacification of the pulmonary arteries to the segmental level. No evidence of pulmonary embolism. Normal heart size. No pericardial effusion. Normal caliber thoracic aorta. No evidence of aortic dissection. Great vessel origins are patent. Scattered calcifications in the aorta and coronary arteries. Mediastinum/Nodes: No enlarged mediastinal, hilar, or axillary lymph nodes. Thyroid gland, trachea, and esophagus demonstrate no significant findings. Lungs/Pleura: Lungs  are clear. No pleural effusion or pneumothorax. Upper Abdomen: 2 cm diameter left adrenal gland nodule. Fat attenuation measurements consistent with benign adenoma. Musculoskeletal: Degenerative changes in the spine. No destructive bone lesions. Review of the MIP images confirms the above findings. IMPRESSION: 1. No evidence of significant pulmonary embolus. 2. No evidence of active pulmonary disease. 3. Aortic atherosclerosis. 4. Benign-appearing left adrenal gland nodule likely representing an adenoma. Aortic Atherosclerosis (ICD10-I70.0). Electronically Signed   By: Lucienne Capers M.D.   On: 01/27/2017 06:27   Dg Chest Port 1 View  Result Date: 01/27/2017 CLINICAL DATA:  Chest pain for 3 days EXAM: PORTABLE CHEST 1 VIEW COMPARISON:  Chest radiograph 12/01/2016 FINDINGS: Patient is rotated to the right. Cardiomegaly is unchanged. There is no  focal airspace consolidation or pulmonary edema. No sizable pleural effusion. IMPRESSION: Unchanged cardiomegaly, again probably exaggerated by the degree of rotation. No focal airspace disease or pulmonary edema. Electronically Signed   By: Ulyses Jarred M.D.   On: 01/27/2017 02:49    Time Spent in minutes  30   [name] M.D on 02/03/2017 at 6:57 AM  Between 7am to 7am    [phone]

## 2017-02-04 ENCOUNTER — Inpatient Hospital Stay (HOSPITAL_COMMUNITY): Payer: Medicare Other

## 2017-02-04 LAB — CBC
HCT: 30.6 % — ABNORMAL LOW (ref 36.0–46.0)
Hemoglobin: 9.2 g/dL — ABNORMAL LOW (ref 12.0–15.0)
MCH: 25.8 pg — ABNORMAL LOW (ref 26.0–34.0)
MCHC: 30.1 g/dL (ref 30.0–36.0)
MCV: 85.7 fL (ref 78.0–100.0)
Platelets: 257 10*3/uL (ref 150–400)
RBC: 3.57 MIL/uL — ABNORMAL LOW (ref 3.87–5.11)
RDW: 15.7 % — ABNORMAL HIGH (ref 11.5–15.5)
WBC: 6.7 10*3/uL (ref 4.0–10.5)

## 2017-02-04 LAB — COMPREHENSIVE METABOLIC PANEL
ALT: 13 U/L — ABNORMAL LOW (ref 14–54)
AST: 18 U/L (ref 15–41)
Albumin: 2.9 g/dL — ABNORMAL LOW (ref 3.5–5.0)
Alkaline Phosphatase: 68 U/L (ref 38–126)
Anion gap: 6 (ref 5–15)
BUN: 25 mg/dL — ABNORMAL HIGH (ref 6–20)
CO2: 30 mmol/L (ref 22–32)
Calcium: 8.5 mg/dL — ABNORMAL LOW (ref 8.9–10.3)
Chloride: 102 mmol/L (ref 101–111)
Creatinine, Ser: 1.28 mg/dL — ABNORMAL HIGH (ref 0.44–1.00)
GFR calc Af Amer: 49 mL/min — ABNORMAL LOW (ref >60)
GFR calc non Af Amer: 42 mL/min — ABNORMAL LOW (ref >60)
Glucose, Bld: 164 mg/dL — ABNORMAL HIGH (ref 65–99)
Potassium: 4.6 mmol/L (ref 3.5–5.1)
Sodium: 138 mmol/L (ref 135–145)
Total Bilirubin: 0.4 mg/dL (ref 0.3–1.2)
Total Protein: 5.6 g/dL — ABNORMAL LOW (ref 6.5–8.1)

## 2017-02-04 LAB — GLUCOSE, CAPILLARY
Glucose-Capillary: 137 mg/dL — ABNORMAL HIGH (ref 65–99)
Glucose-Capillary: 134 mg/dL — ABNORMAL HIGH (ref 65–99)
Glucose-Capillary: 216 mg/dL — ABNORMAL HIGH (ref 65–99)
Glucose-Capillary: 182 mg/dL — ABNORMAL HIGH (ref 65–99)

## 2017-02-04 MED ORDER — FUROSEMIDE 40 MG PO TABS
40.0000 mg | ORAL_TABLET | Freq: Two times a day (BID) | ORAL | Status: DC
  Administered 2017-02-04 – 2017-02-05 (×3): 40 mg via ORAL
  Filled 2017-02-04 (×3): qty 1

## 2017-02-04 MED ORDER — PANTOPRAZOLE SODIUM 40 MG PO TBEC
40.0000 mg | DELAYED_RELEASE_TABLET | Freq: Every day | ORAL | Status: DC
  Administered 2017-02-04 – 2017-02-05 (×2): 40 mg via ORAL
  Filled 2017-02-04 (×2): qty 1

## 2017-02-04 NOTE — Progress Notes (Addendum)
Patient ID: Stephanie Combs, female   DOB: 05/13/1950, 66 y.o.   MRN: [id number]                                                                PROGRESS NOTE                                                                                                                                                                                                             Patient Demographics:    Stephanie Combs, is a 66 y.o. female, DOB - 1951/04/23, TFT:[id number]  Admit date - 01/30/2017   Admitting Physician [name], MD  Outpatient Primary MD for the patient is [name], MD  LOS - 5  Outpatient Specialists:    No chief complaint on file.    Skin ulcer  Brief Narrative  66 y.o.female,w Dm2, hypertension, morbid obeisity, hypothyroidism, CHF (EF 40-45%), Copd, c/o increase in size of skin ulcer w yellow discharge as well as increase in dyspnea and orthopnea, and slight leg swelling. + weight gain. Pt denies fever, chills, cough, cp, palp, n/v, diarrhea, dysuria, hematuria. Her endocrinologistphysician has recommended inpatient admission with iv abx. Pt was seen today in office and sent for direct admission for large abdominal wound as well as w/up of dyspnea.    Subjective:    Stephanie Combs today had nausea yesterday,  Complains of slight ruq pain, sharp   Skin ulcer is smaller,  Had to stop vanco yesterday due to renal insufficiency,  Pt afebrile,  Yellow discharge minimal .     No headache, No chest pain,   No new weakness tingling or numbness, No Cough - SOB.    Assessment  & Plan :    Principal Problem:   Skin ulcer ([place]) Active Problems:   DM type 2 (diabetes mellitus, type 2) ([place])   CKD (chronic kidney disease), stage III   Chronic diastolic CHF (congestive heart failure) (HCC)   Dyspnea   Bilateral edema of lower extremity   DM type 2 (diabetes mellitus, type 2) (HCC) CKD (chronic kidney disease), stage III Chronic diastolic CHF (congestive heart failure)  (HCC) Dyspnea Bilateral edema of lower extremity   Skin ulcer, improving Wound culture (there is an order), but apparently not collected, cont empiric abx Cont vanco iv, rocephin iv  Wound care appreciated Check cbc in am  Dyspnea, Chronic CHF, Bilateral lower ext edema.  Hold Lasix 9/26 Start lasix 40mg  po bid  Nausea, RUQ pain  RUQ ultrasound Start protonix 40mg  po qday Cont pepcid 20mg  po bid Cont zofran, compazine prn  Constipation Fleet enema prn  Muscle spasm Tizanidine 2mg  po q6h prn   CKD stage3 Hold Lasix 9/26 Check cmp in am  Anemia Iron deficiency Cont  Ferrous sulfate 325mg  po qday  DM2 (Hga1c=7.5) Cont fsbs ac and qhs, ISS  Diabetic neuropathy Cont gabapentin, monitor to see if worsens edema  Severe Protein Calorie malnutrition Cont prostat    Code Status:FULL CODE  Family Communication :w patient  Disposition Plan:home  Barriers For Discharge:  Consults :Wound care  Procedures :   DVT Prophylaxis: Lovenox- SCD  Antibiotics :vanco  9/21=> 9/25 Rocephin 9/21=>   Lab Results  Component Value Date   PLT 257 02/04/2017    Anti-infectives    Start     Dose/Rate Route Frequency Ordered Stop   01/31/17 2200  vancomycin (VANCOCIN) 1,500 mg in sodium chloride 0.9 % 500 mL IVPB  Status:  Discontinued     1,500 mg 250 mL/hr over 120 Minutes Intravenous Every 24 hours 01/30/17 1957 02/02/17 2221   01/30/17 2200  cefTRIAXone (ROCEPHIN) 1 g in dextrose 5 % 50 mL IVPB  Status:  Discontinued     1 g 100 mL/hr over 30 Minutes Intravenous Every 24 hours 01/30/17 1938 01/30/17 1950   01/30/17 2100  vancomycin (VANCOCIN) 2,000 mg in sodium chloride 0.9 % 500 mL IVPB     2,000 mg 250 mL/hr over 120 Minutes Intravenous  Once 01/30/17 1949 01/31/17 0006   01/30/17 2030  cefTRIAXone (ROCEPHIN) 2 g in dextrose 5 % 50 mL IVPB     2 g 100 mL/hr over 30 Minutes Intravenous Every 24 hours 01/30/17 1950           Objective:   Vitals:   02/03/17 1936 02/03/17 2101 02/03/17 2133 02/04/17 0537  BP:  (!) 116/38 (!) 154/58 (!) 120/46  Pulse:  65 69 (!) 56  Resp:  18  17  Temp:  98.5 F (36.9 C)  98.5 F (36.9 C)  TempSrc:  Oral  Oral  SpO2: 96% 98%  100%  Weight:      Height:        Wt Readings from Last 3 Encounters:  02/02/17 133.4 kg (294 lb 1.5 oz)  01/26/17 (!) 141.1 kg (311 lb)  12/01/16 (!) 145.6 kg (321 lb)     Intake/Output Summary (Last 24 hours) at 02/04/17 0724 Last data filed at 02/04/17 0538  Gross per 24 hour  Intake             1190 ml  Output             3450 ml  Net            -2260 ml     Physical Exam  Awake Alert, Oriented X 3, No new F.N deficits, Normal affect [place].AT,PERRAL Supple Neck,No JVD, No cervical lymphadenopathy appriciated.  Symmetrical Chest wall movement, Good air movement bilaterally, CTAB RRR,No Gallops,Rubs or new Murmurs, No Parasternal Heave +ve B.Sounds, Abd Soft, No tenderness, No organomegaly appriciated, No rebound - guarding or rigidity. No Cyanosis, Clubbing Awake Alert, Oriented X 3, No new F.N deficits, Normal affect [place].AT,PERRAL Supple Neck,No JVD, No cervical lymphadenopathy appriciated.  Symmetrical Chest wall movement, Good air movement bilaterally, CTAB RRR,No Gallops,Rubs or  new Murmurs, No Parasternal Heave +ve B.Sounds, Abd Soft, No tenderness, No organomegaly appriciated, No rebound - guarding or rigidity. No Cyanosis, Clubbing  trace-1+ edema + lymphedema Multiple shallow skin ulcers on the left lateral abdomen largest about 2.5x3 cm round.  Large 6cm skin ulcer below mid abdomen (size improved) slight yellow discharge lessening   Data Review:    CBC  Recent Labs Lab 01/30/17 1816 02/01/17 0528 02/03/17 0551 02/04/17 0516  WBC 7.8 6.2 6.8 6.7  HGB 10.4* 9.2* 8.9* 9.2*  HCT 34.1* 30.5*  31.0* 28.8* 30.6*  PLT 298 270 235 257  MCV 84.4 84.7 85.7 85.7  MCH 25.7* 25.6* 26.5 25.8*  MCHC 30.5 30.2  30.9 30.1  RDW 15.8* 15.8* 15.8* 15.7*    Chemistries   Recent Labs Lab 01/30/17 1816 02/01/17 0528 02/02/17 0520 02/03/17 0551 02/04/17 0516  NA 138 140  --  140 138  K 4.2 4.1 4.1 4.5 4.6  CL 101 101  --  102 102  CO2 28 30  --  32 30  GLUCOSE 208* 168*  --  136* 164*  BUN 19 18  --  26* 25*  CREATININE 1.17* 1.09* 1.27* 1.68* 1.28*  CALCIUM 8.7* 8.4*  --  8.4* 8.5*  AST 27 16 17 15 18   ALT 16 12* 12* 13* 13*  ALKPHOS 96 79 80 69 68  BILITOT 0.4 0.4 0.4 0.4 0.4   ------------------------------------------------------------------------------------------------------------------ No results for input(s): CHOL, HDL, LDLCALC, TRIG, CHOLHDL, LDLDIRECT in the last 72 hours.  Lab Results  Component Value Date   HGBA1C 7.5 (H) 01/30/2017   ------------------------------------------------------------------------------------------------------------------ No results for input(s): TSH, T4TOTAL, T3FREE, THYROIDAB in the last 72 hours.  Invalid input(s): FREET3 ------------------------------------------------------------------------------------------------------------------ No results for input(s): VITAMINB12, FOLATE, FERRITIN, TIBC, IRON, RETICCTPCT in the last 72 hours.  Coagulation profile No results for input(s): INR, PROTIME in the last 168 hours.  No results for input(s): DDIMER in the last 72 hours.  Cardiac Enzymes  Recent Labs Lab 01/30/17 2014  TROPONINI <0.03   ------------------------------------------------------------------------------------------------------------------    Component Value Date/Time   BNP 115.6 (H) 01/30/2017 1816    Inpatient Medications  Scheduled Meds: . albuterol  2.5 mg Nebulization BID  . aspirin EC  81 mg Oral Daily  . cholecalciferol  5,000 Units Oral Daily  . diltiazem  300 mg Oral Daily  . enoxaparin (LOVENOX) injection  0.5 mg/kg Subcutaneous Q24H  . famotidine  20 mg Oral BID  . feeding supplement (PRO-STAT SUGAR FREE 64)   30 mL Oral BID  . ferrous sulfate  325 mg Oral Q breakfast  . gabapentin  300 mg Oral TID  . insulin aspart  0-5 Units Subcutaneous QHS  . insulin aspart  0-9 Units Subcutaneous TID WC  . insulin aspart  10 Units Subcutaneous TID WC  . insulin detemir  50 Units Subcutaneous QHS  . levothyroxine  244 mcg Oral QAC breakfast  . liraglutide  1.2 mg Subcutaneous QPC breakfast  . nystatin   Topical BID  . potassium chloride  20 mEq Oral BID  . sodium chloride flush  3 mL Intravenous Q12H   Continuous Infusions: . sodium chloride Stopped (02/02/17 1600)  . cefTRIAXone (ROCEPHIN)  IV Stopped (02/03/17 2203)   PRN Meds:.sodium chloride, acetaminophen **OR** acetaminophen, albuterol, ALPRAZolam, ondansetron (ZOFRAN) IV, oxyCODONE-acetaminophen **AND** oxyCODONE, prochlorperazine, sodium chloride flush, sodium phosphate, tiZANidine  Micro Results No results found for this or any previous visit (from the past 240 hour(s)).  Radiology Reports Dg Chest 1 View  Result Date: 01/30/2017 CLINICAL DATA:  Dyspnea EXAM: CHEST 1 VIEW COMPARISON:  None. FINDINGS: Rotated patient. Minimal basilar atelectasis. No acute consolidation or effusion. Borderline cardiomegaly. No pneumothorax. IMPRESSION: Minimal bibasilar atelectasis Electronically Signed   By: Donavan Foil M.D.   On: 01/30/2017 20:36   Ct Angio Chest Pe W And/or Wo Contrast  Result Date: 01/27/2017 CLINICAL DATA:  Chest pain starting 3 days ago. Headaches. Elevated D-dimer. Intermediate clinical probability for pulmonary embolus. EXAM: CT ANGIOGRAPHY CHEST WITH CONTRAST TECHNIQUE: Multidetector CT imaging of the chest was performed using the standard protocol during bolus administration of intravenous contrast. Multiplanar CT image reconstructions and MIPs were obtained to evaluate the vascular anatomy. CONTRAST:  100 mL Isovue 370 COMPARISON:  None. FINDINGS: Cardiovascular: Satisfactory opacification of the pulmonary arteries to the segmental  level. No evidence of pulmonary embolism. Normal heart size. No pericardial effusion. Normal caliber thoracic aorta. No evidence of aortic dissection. Great vessel origins are patent. Scattered calcifications in the aorta and coronary arteries. Mediastinum/Nodes: No enlarged mediastinal, hilar, or axillary lymph nodes. Thyroid gland, trachea, and esophagus demonstrate no significant findings. Lungs/Pleura: Lungs are clear. No pleural effusion or pneumothorax. Upper Abdomen: 2 cm diameter left adrenal gland nodule. Fat attenuation measurements consistent with benign adenoma. Musculoskeletal: Degenerative changes in the spine. No destructive bone lesions. Review of the MIP images confirms the above findings. IMPRESSION: 1. No evidence of significant pulmonary embolus. 2. No evidence of active pulmonary disease. 3. Aortic atherosclerosis. 4. Benign-appearing left adrenal gland nodule likely representing an adenoma. Aortic Atherosclerosis (ICD10-I70.0). Electronically Signed   By: Lucienne Capers M.D.   On: 01/27/2017 06:27   Dg Chest Port 1 View  Result Date: 01/27/2017 CLINICAL DATA:  Chest pain for 3 days EXAM: PORTABLE CHEST 1 VIEW COMPARISON:  Chest radiograph 12/01/2016 FINDINGS: Patient is rotated to the right. Cardiomegaly is unchanged. There is no focal airspace consolidation or pulmonary edema. No sizable pleural effusion. IMPRESSION: Unchanged cardiomegaly, again probably exaggerated by the degree of rotation. No focal airspace disease or pulmonary edema. Electronically Signed   By: Ulyses Jarred M.D.   On: 01/27/2017 02:49    Time Spent in minutes  30   [name] M.D on 02/04/2017 at 7:24 AM  Between 7am to [place] - Pager - [phone]

## 2017-02-04 NOTE — Progress Notes (Addendum)
Spoke with patient at bedside. Patient states she was recently active with [facility] for home abx, she states they did not do wound care. Discussed need for wound care at d/c, patient is agreeable and wants to continue with [facility]. Contacted [facility] for referral, they declined. Contacted multiple agencies, [facility] agreed to accept patient. Patient states her niece will be teachable caregiver, she has all the DME she needs, has home O2 from [place]. Has friend who also assist with transportation. Anticipating d/c in am.

## 2017-02-05 DIAGNOSIS — Z23 Encounter for immunization: Secondary | ICD-10-CM | POA: Diagnosis not present

## 2017-02-05 LAB — CBC
HCT: 37.4 % (ref 36.0–46.0)
Hemoglobin: 11.2 g/dL — ABNORMAL LOW (ref 12.0–15.0)
MCH: 25.3 pg — ABNORMAL LOW (ref 26.0–34.0)
MCHC: 29.9 g/dL — ABNORMAL LOW (ref 30.0–36.0)
MCV: 84.6 fL (ref 78.0–100.0)
Platelets: 309 10*3/uL (ref 150–400)
RBC: 4.42 MIL/uL (ref 3.87–5.11)
RDW: 15.6 % — ABNORMAL HIGH (ref 11.5–15.5)
WBC: 6.7 10*3/uL (ref 4.0–10.5)

## 2017-02-05 LAB — COMPREHENSIVE METABOLIC PANEL
ALT: 20 U/L (ref 14–54)
AST: 28 U/L (ref 15–41)
Albumin: 3.7 g/dL (ref 3.5–5.0)
Alkaline Phosphatase: 101 U/L (ref 38–126)
Anion gap: 10 (ref 5–15)
BUN: 29 mg/dL — ABNORMAL HIGH (ref 6–20)
CO2: 29 mmol/L (ref 22–32)
Calcium: 9.2 mg/dL (ref 8.9–10.3)
Chloride: 98 mmol/L — ABNORMAL LOW (ref 101–111)
Creatinine, Ser: 1.49 mg/dL — ABNORMAL HIGH (ref 0.44–1.00)
GFR calc Af Amer: 41 mL/min — ABNORMAL LOW (ref >60)
GFR calc non Af Amer: 35 mL/min — ABNORMAL LOW (ref >60)
Glucose, Bld: 258 mg/dL — ABNORMAL HIGH (ref 65–99)
Potassium: 4.8 mmol/L (ref 3.5–5.1)
Sodium: 137 mmol/L (ref 135–145)
Total Bilirubin: 0.4 mg/dL (ref 0.3–1.2)
Total Protein: 7.2 g/dL (ref 6.5–8.1)

## 2017-02-05 LAB — GLUCOSE, CAPILLARY
Glucose-Capillary: 256 mg/dL — ABNORMAL HIGH (ref 65–99)
Glucose-Capillary: 127 mg/dL — ABNORMAL HIGH (ref 65–99)

## 2017-02-05 MED ORDER — FERROUS SULFATE 325 (65 FE) MG PO TABS: 325.0000 mg | ORAL_TABLET | Freq: Every day | ORAL | 3 refills | Status: DC

## 2017-02-05 MED ORDER — FUROSEMIDE 20 MG PO TABS: 20.0000 mg | ORAL_TABLET | Freq: Two times a day (BID) | ORAL | 0 refills | Status: DC

## 2017-02-05 MED ORDER — PANTOPRAZOLE SODIUM 40 MG PO TBEC: 40.0000 mg | DELAYED_RELEASE_TABLET | Freq: Every day | ORAL | 0 refills | Status: DC

## 2017-02-05 MED ORDER — PRO-STAT SUGAR FREE PO LIQD: 30.0000 mL | Freq: Two times a day (BID) | ORAL | 0 refills | Status: DC

## 2017-02-05 MED ORDER — CEPHALEXIN 500 MG PO CAPS: 500.0000 mg | ORAL_CAPSULE | Freq: Four times a day (QID) | ORAL | 0 refills | Status: AC

## 2017-02-05 MED ORDER — GABAPENTIN 300 MG PO CAPS: 300.0000 mg | ORAL_CAPSULE | Freq: Three times a day (TID) | ORAL | 0 refills | Status: DC

## 2017-02-05 MED ORDER — CHLORHEXIDINE GLUCONATE CLOTH 2 % EX PADS: 6.0000 | MEDICATED_PAD | Freq: Every day | CUTANEOUS | Status: DC

## 2017-02-05 MED ORDER — MUPIROCIN 2 % EX OINT: 1.0000 "application " | TOPICAL_OINTMENT | Freq: Two times a day (BID) | CUTANEOUS | Status: DC

## 2017-02-05 NOTE — Discharge Summary (Signed)
Stephanie Combs, is a 66 y.o. female  DOB Jun 12, 1950  MRN [id number].  Admission date:  01/30/2017  Admitting Physician  [name], MD  Discharge Date:  02/05/2017   Primary MD  [name], MD  Recommendations for primary care physician for things to follow:   Skin ulcer full thickness Wound care RN. [place], appreciate input.  Keflex 500mg  po qid x 5 days  Diabetic neuropathy Cont gabapentin 300mg  tid  Chronic CHF  Resume lasix 20mg  po bid Check cmp in 1-2 week  Edema Resolved  Severe protein calorie malnutrition Cont prostat  Iron deficiency anemia Check ferritin, iron, tibc cbc in 6 weeks.    Admission Diagnosis  skin ulcer cellulitis   Discharge Diagnosis  skin ulcer cellulitis    Principal Problem:   Skin ulcer ([place]) Active Problems:   DM type 2 (diabetes mellitus, type 2) (HCC)   CKD (chronic kidney disease), stage III   Chronic diastolic CHF (congestive heart failure) (HCC)   Dyspnea   Bilateral edema of lower extremity      Past Medical History:  Diagnosis Date  . Anxiety   . CHF (congestive heart failure) (HCC)    Diastolic  . Chronic back pain   . COPD (chronic obstructive pulmonary disease) ([place])   . Degenerative disk disease   . Diabetes mellitus without complication ([place])   . Hypertension   . Hypothyroidism   . On home O2    2.5 L N/C prn  . Pedal edema     Past Surgical History:  Procedure Laterality Date  . HERNIA REPAIR    . TUBAL LIGATION         HPI  from the history and physical done on the day of admission:   66 y.o. female, w Dm2, hypertension, morbid obeisity, hypothyroidism, CHF (EF 40-45%), Copd, c/o increase in size of skin ulcer w yellow discharge as well as increase in dyspnea and orthopnea, and slight leg swelling.  + weight gain.  Pt denies fever, chills, cough, cp, palp, n/v, diarrhea, dysuria, hematuria.  Her endocrinologist  physician has recommended inpatient admission with iv abx.  Pt was seen today in office and sent for direct admission for large abdominal wound as well as w/up of dyspnea.       Hospital Course:    Pt was admitted and started on vanco iv, rocephin iv empirically, blood culture x2 negative, wound culture was ordered but apparently not obtained. Pt was started on lasix 60mg  iv bid,  And developed mild renal insufficiency,  With a bump in creatinine 1.69.  Lasix was held and creatinne improved and patient was started on oral lasix.  Pt wound has improved with wound care,  Previously like 7cm now 5.5cm.        Follow UP  Follow-up Information    [place] Follow up.   Specialty:  [place] Why:  nurse to assist with wound care Contact information: [place]  [place]        [name], MD Follow up in 1 week(s).   Specialty:  Internal Medicine Contact information: [place] [address] [phone]            Consults obtained - none  Discharge Condition: stable  Diet and Activity recommendation: See Discharge Instructions below  Discharge Instructions         Discharge Medications     Allergies as of 02/05/2017      Reactions   Dilaudid [hydromorphone Hcl] Itching   Actifed Cold-allergy [chlorpheniramine-phenylephrine]    "I was sick and red and it didn't agree with me at all"   Doxycycline Nausea And Vomiting   Other Cough   Pt states she is allergic to ragweed and that she starts coughing and sneezing like crazy   Penicillins Hives, Itching   Tolerates Rocephin Has patient had a PCN reaction causing immediate rash, facial/tongue/throat swelling, SOB or lightheadedness with hypotension: Yes Has patient had a PCN reaction causing severe rash involving mucus membranes or skin necrosis: No Has patient had a PCN reaction that required hospitalization  Yes Has patient had a PCN reaction occurring within the last 10 years: No If all of the above answers are "NO", then may proceed with Cephalosporin use.   Reglan [metoclopramide] Itching   Valium [diazepam] Itching   Vistaril [hydroxyzine Hcl] Itching      Medication List    TAKE these medications   albuterol 108 (90 Base) MCG/ACT inhaler Commonly known as:  PROAIR HFA Inhale 1 puff into the lungs every 4 (four) hours as needed. For shortness of breath What changed:  how much to take  additional instructions   ALPRAZolam 1 MG tablet Commonly known as:  XANAX Take 1 mg by mouth 3 (three) times daily as needed for anxiety or sleep.   aspirin EC 81 MG tablet Take 81 mg by mouth daily.   cephALEXin 500 MG capsule Commonly known as:  KEFLEX Take 1 capsule (500 mg total) by mouth 4 (four) times daily.   diltiazem 300 MG 24 hr capsule Commonly known as:  CARDIZEM CD Take 300 mg by mouth daily.   feeding supplement (PRO-STAT SUGAR FREE 64) Liqd Take 30 mLs by mouth 2 (two) times daily.   ferrous sulfate 325 (65 FE) MG tablet Take 1 tablet (325 mg total) by mouth daily with breakfast.   FISH OIL PO Take 1 capsule by mouth daily.   furosemide 20 MG tablet Commonly known as:  LASIX Take 1 tablet (20 mg total) by mouth 2 (two) times daily.   gabapentin 300 MG capsule Commonly known as:  NEURONTIN Take 1 capsule (300 mg total) by mouth 3 (three) times daily. What changed:  medication strength  how much to take  when to take this  Another medication with the same name was removed. Continue taking this medication, and follow the directions you see here.   insulin detemir 100 UNIT/ML injection Commonly known as:  LEVEMIR Inject 0.5 mLs (50 Units total) into the skin at bedtime.   insulin lispro 100 UNIT/ML injection Commonly known as:  HUMALOG Inject 0.1-0.16 mLs (10-16 Units total) into the skin 3 (three) times daily before meals. What changed:  how much to  take  when to take this  reasons to take this   INSULIN SYRINGE .5CC/29G 29G X 1/2" 0.5 ML Misc Use to inject insulin 4 times a day.   levothyroxine 112 MCG tablet Commonly known  as:  SYNTHROID, LEVOTHROID Take 224 mcg by mouth daily before breakfast.   lidocaine-prilocaine cream Commonly known as:  EMLA Apply 1 application topically as needed. What changed:  when to take this   nystatin powder Commonly known as:  MYCOSTATIN/NYSTOP Apply topically 2 (two) times daily. What changed:  Another medication with the same name was removed. Continue taking this medication, and follow the directions you see here.   ondansetron 4 MG tablet Commonly known as:  ZOFRAN Take 4 mg by mouth every 8 (eight) hours as needed for vomiting.   oxyCODONE-acetaminophen 10-325 MG tablet Commonly known as:  PERCOCET Take 1 tablet by mouth every 6 (six) hours as needed for pain.   pantoprazole 40 MG tablet Commonly known as:  PROTONIX Take 1 tablet (40 mg total) by mouth daily.   potassium chloride 10 MEQ tablet Commonly known as:  K-DUR Take 20 mEq by mouth 2 (two) times daily.   ranitidine 150 MG tablet Commonly known as:  ZANTAC Take 450 mg by mouth 2 (two) times daily.   silver sulfADIAZINE 1 % cream Commonly known as:  SILVADENE Apply 1 application topically daily.   tiZANidine 4 MG tablet Commonly known as:  ZANAFLEX Take 4 mg by mouth every 8 (eight) hours as needed for muscle spasms.   topiramate 25 MG tablet Commonly known as:  TOPAMAX Take 75 mg by mouth daily.   triamcinolone cream 0.1 % Commonly known as:  KENALOG Apply 1 application topically 4 (four) times daily.   VICTOZA 18 MG/3ML Sopn Generic drug:  liraglutide Inject 1.2 mg into the skin daily.   Vitamin D3 5000 units Caps Take 1 capsule (5,000 Units total) by mouth daily.            Discharge Care Instructions        Start     Ordered   02/05/17 0000  Amino Acids-Protein Hydrolys (FEEDING  SUPPLEMENT, PRO-STAT SUGAR FREE 64,) LIQD  2 times daily     02/05/17 0840   02/05/17 0000  ferrous sulfate 325 (65 FE) MG tablet  Daily with breakfast     02/05/17 0840   02/05/17 0000  furosemide (LASIX) 20 MG tablet  2 times daily     02/05/17 0840   02/05/17 0000  gabapentin (NEURONTIN) 300 MG capsule  3 times daily     02/05/17 0840   02/05/17 0000  pantoprazole (PROTONIX) 40 MG tablet  Daily     02/05/17 0840   02/05/17 0000  cephALEXin (KEFLEX) 500 MG capsule  4 times daily     02/05/17 0840      Major procedures and Radiology Reports - PLEASE review detailed and final reports for all details, in brief -      Dg Chest 1 View  Result Date: 01/30/2017 CLINICAL DATA:  Dyspnea EXAM: CHEST 1 VIEW COMPARISON:  None. FINDINGS: Rotated patient. Minimal basilar atelectasis. No acute consolidation or effusion. Borderline cardiomegaly. No pneumothorax. IMPRESSION: Minimal bibasilar atelectasis Electronically Signed   By: Donavan Foil M.D.   On: 01/30/2017 20:36   Ct Angio Chest Pe W And/or Wo Contrast  Result Date: 01/27/2017 CLINICAL DATA:  Chest pain starting 3 days ago. Headaches. Elevated D-dimer. Intermediate clinical probability for pulmonary embolus. EXAM: CT ANGIOGRAPHY CHEST WITH CONTRAST TECHNIQUE: Multidetector CT imaging of the chest was performed using the standard protocol during bolus administration of intravenous contrast. Multiplanar CT image reconstructions and MIPs were obtained to evaluate the vascular anatomy. CONTRAST:  100 mL Isovue 370  COMPARISON:  None. FINDINGS: Cardiovascular: Satisfactory opacification of the pulmonary arteries to the segmental level. No evidence of pulmonary embolism. Normal heart size. No pericardial effusion. Normal caliber thoracic aorta. No evidence of aortic dissection. Great vessel origins are patent. Scattered calcifications in the aorta and coronary arteries. Mediastinum/Nodes: No enlarged mediastinal, hilar, or axillary lymph nodes.  Thyroid gland, trachea, and esophagus demonstrate no significant findings. Lungs/Pleura: Lungs are clear. No pleural effusion or pneumothorax. Upper Abdomen: 2 cm diameter left adrenal gland nodule. Fat attenuation measurements consistent with benign adenoma. Musculoskeletal: Degenerative changes in the spine. No destructive bone lesions. Review of the MIP images confirms the above findings. IMPRESSION: 1. No evidence of significant pulmonary embolus. 2. No evidence of active pulmonary disease. 3. Aortic atherosclerosis. 4. Benign-appearing left adrenal gland nodule likely representing an adenoma. Aortic Atherosclerosis (ICD10-I70.0). Electronically Signed   By: Lucienne Capers M.D.   On: 01/27/2017 06:27   Dg Chest Port 1 View  Result Date: 01/27/2017 CLINICAL DATA:  Chest pain for 3 days EXAM: PORTABLE CHEST 1 VIEW COMPARISON:  Chest radiograph 12/01/2016 FINDINGS: Patient is rotated to the right. Cardiomegaly is unchanged. There is no focal airspace consolidation or pulmonary edema. No sizable pleural effusion. IMPRESSION: Unchanged cardiomegaly, again probably exaggerated by the degree of rotation. No focal airspace disease or pulmonary edema. Electronically Signed   By: Ulyses Jarred M.D.   On: 01/27/2017 02:49   US Abdomen Limited Ruq  Result Date: 02/04/2017 CLINICAL DATA:  Acute right upper quadrant abdominal pain. EXAM: ULTRASOUND ABDOMEN LIMITED RIGHT UPPER QUADRANT COMPARISON:  None. FINDINGS: Gallbladder: Mild cholelithiasis is noted with the largest stone measuring 3 mm. No gallbladder wall thickening or pericholecystic fluid is noted. No sonographic Murphy's sign is noted. Common bile duct: Diameter: 5 mm which is within normal limits. Liver: No focal lesion identified. Within normal limits in parenchymal echogenicity. Portal vein is patent on color Doppler imaging with normal direction of blood flow towards the liver. IMPRESSION: Mild cholelithiasis without evidence of cholecystitis. No other  abnormality seen in the right upper quadrant of the abdomen. Electronically Signed   By: Marijo Conception, M.D.   On: 02/04/2017 15:00    Micro Results     No results found for this or any previous visit (from the past 240 hour(s)).     Today   Subjective    Stephanie Combs today states that her wound has less yellow discharge.  Edema resolved.  Breathing improved.   no headache,no chest abdominal pain,no new weakness tingling or numbness, feels much better wants to go home today.   Objective   Blood pressure (!) 144/60, pulse 60, temperature 98.4 F (36.9 C), temperature source Oral, resp. rate 18, height 5\' 6"  (1.676 m), weight 133.4 kg (294 lb 1.5 oz), SpO2 100 %.   Intake/Output Summary (Last 24 hours) at 02/05/17 0841 Last data filed at 02/05/17 0600  Gross per 24 hour  Intake              650 ml  Output             4700 ml  Net            -4050 ml    Exam Awake Alert, Oriented x 3, No new F.N deficits, Normal affect [place].AT,PERRAL Supple Neck,No JVD, No cervical lymphadenopathy appriciated.  Symmetrical Chest wall movement, Good air movement bilaterally, CTAB RRR,No Gallops,Rubs or new Murmurs, No Parasternal Heave +ve B.Sounds, Abd Soft, Non tender, No organomegaly appriciated, No rebound -  guarding or rigidity. No Cyanosis, Clubbing or edema, No new Rash or bruise Multiple shallow skin ulcers on the left lateral abdomen largest about 2.5x3 cm round.  Large 5.5cm skin ulcer below mid abdomen (size improved) slight yellow discharge lessening   Data Review   CBC w Diff: Lab Results  Component Value Date   WBC 6.7 02/05/2017   HGB 11.2 (L) 02/05/2017   HCT 37.4 02/05/2017   HCT 31.0 (L) 02/01/2017   PLT 309 02/05/2017   LYMPHOPCT 23 01/27/2017   MONOPCT 11 01/27/2017   EOSPCT 2 01/27/2017   BASOPCT 1 01/27/2017    CMP: Lab Results  Component Value Date   NA 137 02/05/2017   K 4.8 02/05/2017   CL 98 (L) 02/05/2017   CO2 29 02/05/2017   BUN 29 (H)  02/05/2017   BUN 28 (A) 11/11/2016   CREATININE 1.49 (H) 02/05/2017   PROT 7.2 02/05/2017   ALBUMIN 3.7 02/05/2017   BILITOT 0.4 02/05/2017   ALKPHOS 101 02/05/2017   AST 28 02/05/2017   ALT 20 02/05/2017  .   Total Time in preparing paper work, data evaluation and todays exam - 46 minutes  [name] M.D on 02/05/2017 at 8:41 AM  [facility]   Office  [phone]

## 2017-02-07 DIAGNOSIS — N390 Urinary tract infection, site not specified: Secondary | ICD-10-CM | POA: Diagnosis not present

## 2017-02-07 DIAGNOSIS — R0600 Dyspnea, unspecified: Secondary | ICD-10-CM | POA: Diagnosis not present

## 2017-02-07 DIAGNOSIS — I10 Essential (primary) hypertension: Secondary | ICD-10-CM | POA: Diagnosis not present

## 2017-02-07 DIAGNOSIS — N179 Acute kidney failure, unspecified: Secondary | ICD-10-CM | POA: Diagnosis not present

## 2017-02-07 DIAGNOSIS — J209 Acute bronchitis, unspecified: Secondary | ICD-10-CM | POA: Diagnosis not present

## 2017-02-07 DIAGNOSIS — L98499 Non-pressure chronic ulcer of skin of other sites with unspecified severity: Secondary | ICD-10-CM | POA: Diagnosis not present

## 2017-02-07 DIAGNOSIS — J449 Chronic obstructive pulmonary disease, unspecified: Secondary | ICD-10-CM | POA: Diagnosis not present

## 2017-02-07 DIAGNOSIS — E119 Type 2 diabetes mellitus without complications: Secondary | ICD-10-CM | POA: Diagnosis not present

## 2017-02-07 DIAGNOSIS — E162 Hypoglycemia, unspecified: Secondary | ICD-10-CM | POA: Diagnosis not present

## 2017-02-07 DIAGNOSIS — I959 Hypotension, unspecified: Secondary | ICD-10-CM | POA: Diagnosis not present

## 2017-02-08 DIAGNOSIS — L98499 Non-pressure chronic ulcer of skin of other sites with unspecified severity: Secondary | ICD-10-CM | POA: Diagnosis not present

## 2017-02-08 DIAGNOSIS — N390 Urinary tract infection, site not specified: Secondary | ICD-10-CM | POA: Diagnosis not present

## 2017-02-08 DIAGNOSIS — N179 Acute kidney failure, unspecified: Secondary | ICD-10-CM | POA: Diagnosis not present

## 2017-02-08 DIAGNOSIS — E119 Type 2 diabetes mellitus without complications: Secondary | ICD-10-CM | POA: Diagnosis not present

## 2017-02-08 DIAGNOSIS — J209 Acute bronchitis, unspecified: Secondary | ICD-10-CM | POA: Diagnosis not present

## 2017-02-08 DIAGNOSIS — I959 Hypotension, unspecified: Secondary | ICD-10-CM | POA: Diagnosis not present

## 2017-02-08 DIAGNOSIS — R0600 Dyspnea, unspecified: Secondary | ICD-10-CM | POA: Diagnosis not present

## 2017-02-08 DIAGNOSIS — E162 Hypoglycemia, unspecified: Secondary | ICD-10-CM | POA: Diagnosis not present

## 2017-02-08 DIAGNOSIS — I10 Essential (primary) hypertension: Secondary | ICD-10-CM | POA: Diagnosis not present

## 2017-02-08 DIAGNOSIS — J449 Chronic obstructive pulmonary disease, unspecified: Secondary | ICD-10-CM | POA: Diagnosis not present

## 2017-02-09 DIAGNOSIS — E1122 Type 2 diabetes mellitus with diabetic chronic kidney disease: Secondary | ICD-10-CM | POA: Diagnosis not present

## 2017-02-09 DIAGNOSIS — N183 Chronic kidney disease, stage 3 (moderate): Secondary | ICD-10-CM | POA: Diagnosis not present

## 2017-02-09 DIAGNOSIS — I130 Hypertensive heart and chronic kidney disease with heart failure and stage 1 through stage 4 chronic kidney disease, or unspecified chronic kidney disease: Secondary | ICD-10-CM | POA: Diagnosis not present

## 2017-02-09 DIAGNOSIS — D631 Anemia in chronic kidney disease: Secondary | ICD-10-CM | POA: Diagnosis not present

## 2017-02-09 DIAGNOSIS — L304 Erythema intertrigo: Secondary | ICD-10-CM | POA: Diagnosis not present

## 2017-02-09 DIAGNOSIS — J449 Chronic obstructive pulmonary disease, unspecified: Secondary | ICD-10-CM | POA: Diagnosis not present

## 2017-02-09 DIAGNOSIS — Z79810 Long term (current) use of selective estrogen receptor modulators (SERMs): Secondary | ICD-10-CM | POA: Diagnosis not present

## 2017-02-09 DIAGNOSIS — Z794 Long term (current) use of insulin: Secondary | ICD-10-CM | POA: Diagnosis not present

## 2017-02-09 DIAGNOSIS — I5032 Chronic diastolic (congestive) heart failure: Secondary | ICD-10-CM | POA: Diagnosis not present

## 2017-02-11 DIAGNOSIS — Z79810 Long term (current) use of selective estrogen receptor modulators (SERMs): Secondary | ICD-10-CM | POA: Diagnosis not present

## 2017-02-11 DIAGNOSIS — J449 Chronic obstructive pulmonary disease, unspecified: Secondary | ICD-10-CM | POA: Diagnosis not present

## 2017-02-11 DIAGNOSIS — L304 Erythema intertrigo: Secondary | ICD-10-CM | POA: Diagnosis not present

## 2017-02-11 DIAGNOSIS — I5032 Chronic diastolic (congestive) heart failure: Secondary | ICD-10-CM | POA: Diagnosis not present

## 2017-02-11 DIAGNOSIS — Z794 Long term (current) use of insulin: Secondary | ICD-10-CM | POA: Diagnosis not present

## 2017-02-11 DIAGNOSIS — D631 Anemia in chronic kidney disease: Secondary | ICD-10-CM | POA: Diagnosis not present

## 2017-02-11 DIAGNOSIS — E1122 Type 2 diabetes mellitus with diabetic chronic kidney disease: Secondary | ICD-10-CM | POA: Diagnosis not present

## 2017-02-11 DIAGNOSIS — E1165 Type 2 diabetes mellitus with hyperglycemia: Secondary | ICD-10-CM | POA: Diagnosis not present

## 2017-02-11 DIAGNOSIS — N183 Chronic kidney disease, stage 3 (moderate): Secondary | ICD-10-CM | POA: Diagnosis not present

## 2017-02-11 DIAGNOSIS — I130 Hypertensive heart and chronic kidney disease with heart failure and stage 1 through stage 4 chronic kidney disease, or unspecified chronic kidney disease: Secondary | ICD-10-CM | POA: Diagnosis not present

## 2017-02-12 DIAGNOSIS — N183 Chronic kidney disease, stage 3 (moderate): Secondary | ICD-10-CM | POA: Diagnosis not present

## 2017-02-12 DIAGNOSIS — Z79810 Long term (current) use of selective estrogen receptor modulators (SERMs): Secondary | ICD-10-CM | POA: Diagnosis not present

## 2017-02-12 DIAGNOSIS — D631 Anemia in chronic kidney disease: Secondary | ICD-10-CM | POA: Diagnosis not present

## 2017-02-12 DIAGNOSIS — L304 Erythema intertrigo: Secondary | ICD-10-CM | POA: Diagnosis not present

## 2017-02-12 DIAGNOSIS — J449 Chronic obstructive pulmonary disease, unspecified: Secondary | ICD-10-CM | POA: Diagnosis not present

## 2017-02-12 DIAGNOSIS — Z794 Long term (current) use of insulin: Secondary | ICD-10-CM | POA: Diagnosis not present

## 2017-02-12 DIAGNOSIS — L89152 Pressure ulcer of sacral region, stage 2: Secondary | ICD-10-CM | POA: Diagnosis not present

## 2017-02-12 DIAGNOSIS — E1122 Type 2 diabetes mellitus with diabetic chronic kidney disease: Secondary | ICD-10-CM | POA: Diagnosis not present

## 2017-02-12 DIAGNOSIS — I5032 Chronic diastolic (congestive) heart failure: Secondary | ICD-10-CM | POA: Diagnosis not present

## 2017-02-12 DIAGNOSIS — I130 Hypertensive heart and chronic kidney disease with heart failure and stage 1 through stage 4 chronic kidney disease, or unspecified chronic kidney disease: Secondary | ICD-10-CM | POA: Diagnosis not present

## 2017-02-13 DIAGNOSIS — I130 Hypertensive heart and chronic kidney disease with heart failure and stage 1 through stage 4 chronic kidney disease, or unspecified chronic kidney disease: Secondary | ICD-10-CM | POA: Diagnosis not present

## 2017-02-13 DIAGNOSIS — Z79810 Long term (current) use of selective estrogen receptor modulators (SERMs): Secondary | ICD-10-CM | POA: Diagnosis not present

## 2017-02-13 DIAGNOSIS — Z794 Long term (current) use of insulin: Secondary | ICD-10-CM | POA: Diagnosis not present

## 2017-02-13 DIAGNOSIS — D631 Anemia in chronic kidney disease: Secondary | ICD-10-CM | POA: Diagnosis not present

## 2017-02-13 DIAGNOSIS — I5032 Chronic diastolic (congestive) heart failure: Secondary | ICD-10-CM | POA: Diagnosis not present

## 2017-02-13 DIAGNOSIS — L304 Erythema intertrigo: Secondary | ICD-10-CM | POA: Diagnosis not present

## 2017-02-13 DIAGNOSIS — J449 Chronic obstructive pulmonary disease, unspecified: Secondary | ICD-10-CM | POA: Diagnosis not present

## 2017-02-13 DIAGNOSIS — E1122 Type 2 diabetes mellitus with diabetic chronic kidney disease: Secondary | ICD-10-CM | POA: Diagnosis not present

## 2017-02-13 DIAGNOSIS — N183 Chronic kidney disease, stage 3 (moderate): Secondary | ICD-10-CM | POA: Diagnosis not present

## 2017-02-16 DIAGNOSIS — R269 Unspecified abnormalities of gait and mobility: Secondary | ICD-10-CM | POA: Diagnosis not present

## 2017-02-16 DIAGNOSIS — Z79891 Long term (current) use of opiate analgesic: Secondary | ICD-10-CM | POA: Diagnosis not present

## 2017-02-16 DIAGNOSIS — M5416 Radiculopathy, lumbar region: Secondary | ICD-10-CM | POA: Diagnosis not present

## 2017-02-17 DIAGNOSIS — L304 Erythema intertrigo: Secondary | ICD-10-CM | POA: Diagnosis not present

## 2017-02-17 DIAGNOSIS — Z794 Long term (current) use of insulin: Secondary | ICD-10-CM | POA: Diagnosis not present

## 2017-02-17 DIAGNOSIS — Z79810 Long term (current) use of selective estrogen receptor modulators (SERMs): Secondary | ICD-10-CM | POA: Diagnosis not present

## 2017-02-17 DIAGNOSIS — D631 Anemia in chronic kidney disease: Secondary | ICD-10-CM | POA: Diagnosis not present

## 2017-02-17 DIAGNOSIS — J449 Chronic obstructive pulmonary disease, unspecified: Secondary | ICD-10-CM | POA: Diagnosis not present

## 2017-02-17 DIAGNOSIS — I5032 Chronic diastolic (congestive) heart failure: Secondary | ICD-10-CM | POA: Diagnosis not present

## 2017-02-17 DIAGNOSIS — E1122 Type 2 diabetes mellitus with diabetic chronic kidney disease: Secondary | ICD-10-CM | POA: Diagnosis not present

## 2017-02-17 DIAGNOSIS — I130 Hypertensive heart and chronic kidney disease with heart failure and stage 1 through stage 4 chronic kidney disease, or unspecified chronic kidney disease: Secondary | ICD-10-CM | POA: Diagnosis not present

## 2017-02-17 DIAGNOSIS — N183 Chronic kidney disease, stage 3 (moderate): Secondary | ICD-10-CM | POA: Diagnosis not present

## 2017-02-18 DIAGNOSIS — Z79810 Long term (current) use of selective estrogen receptor modulators (SERMs): Secondary | ICD-10-CM | POA: Diagnosis not present

## 2017-02-18 DIAGNOSIS — Z794 Long term (current) use of insulin: Secondary | ICD-10-CM | POA: Diagnosis not present

## 2017-02-18 DIAGNOSIS — I130 Hypertensive heart and chronic kidney disease with heart failure and stage 1 through stage 4 chronic kidney disease, or unspecified chronic kidney disease: Secondary | ICD-10-CM | POA: Diagnosis not present

## 2017-02-18 DIAGNOSIS — I5032 Chronic diastolic (congestive) heart failure: Secondary | ICD-10-CM | POA: Diagnosis not present

## 2017-02-18 DIAGNOSIS — J449 Chronic obstructive pulmonary disease, unspecified: Secondary | ICD-10-CM | POA: Diagnosis not present

## 2017-02-18 DIAGNOSIS — E1122 Type 2 diabetes mellitus with diabetic chronic kidney disease: Secondary | ICD-10-CM | POA: Diagnosis not present

## 2017-02-18 DIAGNOSIS — L304 Erythema intertrigo: Secondary | ICD-10-CM | POA: Diagnosis not present

## 2017-02-18 DIAGNOSIS — N183 Chronic kidney disease, stage 3 (moderate): Secondary | ICD-10-CM | POA: Diagnosis not present

## 2017-02-18 DIAGNOSIS — D631 Anemia in chronic kidney disease: Secondary | ICD-10-CM | POA: Diagnosis not present

## 2017-02-20 DIAGNOSIS — D631 Anemia in chronic kidney disease: Secondary | ICD-10-CM | POA: Diagnosis not present

## 2017-02-20 DIAGNOSIS — J449 Chronic obstructive pulmonary disease, unspecified: Secondary | ICD-10-CM | POA: Diagnosis not present

## 2017-02-20 DIAGNOSIS — L304 Erythema intertrigo: Secondary | ICD-10-CM | POA: Diagnosis not present

## 2017-02-20 DIAGNOSIS — Z794 Long term (current) use of insulin: Secondary | ICD-10-CM | POA: Diagnosis not present

## 2017-02-20 DIAGNOSIS — N183 Chronic kidney disease, stage 3 (moderate): Secondary | ICD-10-CM | POA: Diagnosis not present

## 2017-02-20 DIAGNOSIS — I130 Hypertensive heart and chronic kidney disease with heart failure and stage 1 through stage 4 chronic kidney disease, or unspecified chronic kidney disease: Secondary | ICD-10-CM | POA: Diagnosis not present

## 2017-02-20 DIAGNOSIS — E1122 Type 2 diabetes mellitus with diabetic chronic kidney disease: Secondary | ICD-10-CM | POA: Diagnosis not present

## 2017-02-20 DIAGNOSIS — I5032 Chronic diastolic (congestive) heart failure: Secondary | ICD-10-CM | POA: Diagnosis not present

## 2017-02-20 DIAGNOSIS — Z79810 Long term (current) use of selective estrogen receptor modulators (SERMs): Secondary | ICD-10-CM | POA: Diagnosis not present

## 2017-02-23 DIAGNOSIS — I130 Hypertensive heart and chronic kidney disease with heart failure and stage 1 through stage 4 chronic kidney disease, or unspecified chronic kidney disease: Secondary | ICD-10-CM | POA: Diagnosis not present

## 2017-02-23 DIAGNOSIS — N183 Chronic kidney disease, stage 3 (moderate): Secondary | ICD-10-CM | POA: Diagnosis not present

## 2017-02-23 DIAGNOSIS — I5032 Chronic diastolic (congestive) heart failure: Secondary | ICD-10-CM | POA: Diagnosis not present

## 2017-02-23 DIAGNOSIS — E1122 Type 2 diabetes mellitus with diabetic chronic kidney disease: Secondary | ICD-10-CM | POA: Diagnosis not present

## 2017-02-23 DIAGNOSIS — J449 Chronic obstructive pulmonary disease, unspecified: Secondary | ICD-10-CM | POA: Diagnosis not present

## 2017-02-23 DIAGNOSIS — L304 Erythema intertrigo: Secondary | ICD-10-CM | POA: Diagnosis not present

## 2017-02-23 DIAGNOSIS — Z794 Long term (current) use of insulin: Secondary | ICD-10-CM | POA: Diagnosis not present

## 2017-02-23 DIAGNOSIS — Z79810 Long term (current) use of selective estrogen receptor modulators (SERMs): Secondary | ICD-10-CM | POA: Diagnosis not present

## 2017-02-23 DIAGNOSIS — D631 Anemia in chronic kidney disease: Secondary | ICD-10-CM | POA: Diagnosis not present

## 2017-02-25 DIAGNOSIS — L304 Erythema intertrigo: Secondary | ICD-10-CM | POA: Diagnosis not present

## 2017-02-25 DIAGNOSIS — I130 Hypertensive heart and chronic kidney disease with heart failure and stage 1 through stage 4 chronic kidney disease, or unspecified chronic kidney disease: Secondary | ICD-10-CM | POA: Diagnosis not present

## 2017-02-25 DIAGNOSIS — Z79810 Long term (current) use of selective estrogen receptor modulators (SERMs): Secondary | ICD-10-CM | POA: Diagnosis not present

## 2017-02-25 DIAGNOSIS — J449 Chronic obstructive pulmonary disease, unspecified: Secondary | ICD-10-CM | POA: Diagnosis not present

## 2017-02-25 DIAGNOSIS — I5032 Chronic diastolic (congestive) heart failure: Secondary | ICD-10-CM | POA: Diagnosis not present

## 2017-02-25 DIAGNOSIS — D631 Anemia in chronic kidney disease: Secondary | ICD-10-CM | POA: Diagnosis not present

## 2017-02-25 DIAGNOSIS — Z794 Long term (current) use of insulin: Secondary | ICD-10-CM | POA: Diagnosis not present

## 2017-02-25 DIAGNOSIS — E1122 Type 2 diabetes mellitus with diabetic chronic kidney disease: Secondary | ICD-10-CM | POA: Diagnosis not present

## 2017-02-25 DIAGNOSIS — N183 Chronic kidney disease, stage 3 (moderate): Secondary | ICD-10-CM | POA: Diagnosis not present

## 2017-02-26 DIAGNOSIS — M5416 Radiculopathy, lumbar region: Secondary | ICD-10-CM | POA: Diagnosis not present

## 2017-02-27 DIAGNOSIS — D631 Anemia in chronic kidney disease: Secondary | ICD-10-CM | POA: Diagnosis not present

## 2017-02-27 DIAGNOSIS — L304 Erythema intertrigo: Secondary | ICD-10-CM | POA: Diagnosis not present

## 2017-02-27 DIAGNOSIS — Z79810 Long term (current) use of selective estrogen receptor modulators (SERMs): Secondary | ICD-10-CM | POA: Diagnosis not present

## 2017-02-27 DIAGNOSIS — E1122 Type 2 diabetes mellitus with diabetic chronic kidney disease: Secondary | ICD-10-CM | POA: Diagnosis not present

## 2017-02-27 DIAGNOSIS — I5032 Chronic diastolic (congestive) heart failure: Secondary | ICD-10-CM | POA: Diagnosis not present

## 2017-02-27 DIAGNOSIS — I130 Hypertensive heart and chronic kidney disease with heart failure and stage 1 through stage 4 chronic kidney disease, or unspecified chronic kidney disease: Secondary | ICD-10-CM | POA: Diagnosis not present

## 2017-02-27 DIAGNOSIS — J449 Chronic obstructive pulmonary disease, unspecified: Secondary | ICD-10-CM | POA: Diagnosis not present

## 2017-02-27 DIAGNOSIS — Z794 Long term (current) use of insulin: Secondary | ICD-10-CM | POA: Diagnosis not present

## 2017-02-27 DIAGNOSIS — N183 Chronic kidney disease, stage 3 (moderate): Secondary | ICD-10-CM | POA: Diagnosis not present

## 2017-03-02 DIAGNOSIS — Z79810 Long term (current) use of selective estrogen receptor modulators (SERMs): Secondary | ICD-10-CM | POA: Diagnosis not present

## 2017-03-02 DIAGNOSIS — E1122 Type 2 diabetes mellitus with diabetic chronic kidney disease: Secondary | ICD-10-CM | POA: Diagnosis not present

## 2017-03-02 DIAGNOSIS — I5032 Chronic diastolic (congestive) heart failure: Secondary | ICD-10-CM | POA: Diagnosis not present

## 2017-03-02 DIAGNOSIS — I130 Hypertensive heart and chronic kidney disease with heart failure and stage 1 through stage 4 chronic kidney disease, or unspecified chronic kidney disease: Secondary | ICD-10-CM | POA: Diagnosis not present

## 2017-03-02 DIAGNOSIS — Z794 Long term (current) use of insulin: Secondary | ICD-10-CM | POA: Diagnosis not present

## 2017-03-02 DIAGNOSIS — N183 Chronic kidney disease, stage 3 (moderate): Secondary | ICD-10-CM | POA: Diagnosis not present

## 2017-03-02 DIAGNOSIS — D631 Anemia in chronic kidney disease: Secondary | ICD-10-CM | POA: Diagnosis not present

## 2017-03-02 DIAGNOSIS — L304 Erythema intertrigo: Secondary | ICD-10-CM | POA: Diagnosis not present

## 2017-03-02 DIAGNOSIS — J449 Chronic obstructive pulmonary disease, unspecified: Secondary | ICD-10-CM | POA: Diagnosis not present

## 2017-03-04 DIAGNOSIS — Z794 Long term (current) use of insulin: Secondary | ICD-10-CM | POA: Diagnosis not present

## 2017-03-04 DIAGNOSIS — E1140 Type 2 diabetes mellitus with diabetic neuropathy, unspecified: Secondary | ICD-10-CM | POA: Diagnosis not present

## 2017-03-04 DIAGNOSIS — D631 Anemia in chronic kidney disease: Secondary | ICD-10-CM | POA: Diagnosis not present

## 2017-03-04 DIAGNOSIS — Z79810 Long term (current) use of selective estrogen receptor modulators (SERMs): Secondary | ICD-10-CM | POA: Diagnosis not present

## 2017-03-04 DIAGNOSIS — N183 Chronic kidney disease, stage 3 (moderate): Secondary | ICD-10-CM | POA: Diagnosis not present

## 2017-03-04 DIAGNOSIS — L98499 Non-pressure chronic ulcer of skin of other sites with unspecified severity: Secondary | ICD-10-CM | POA: Diagnosis not present

## 2017-03-04 DIAGNOSIS — L304 Erythema intertrigo: Secondary | ICD-10-CM | POA: Diagnosis not present

## 2017-03-04 DIAGNOSIS — I10 Essential (primary) hypertension: Secondary | ICD-10-CM | POA: Diagnosis not present

## 2017-03-04 DIAGNOSIS — M25561 Pain in right knee: Secondary | ICD-10-CM | POA: Diagnosis not present

## 2017-03-04 DIAGNOSIS — I130 Hypertensive heart and chronic kidney disease with heart failure and stage 1 through stage 4 chronic kidney disease, or unspecified chronic kidney disease: Secondary | ICD-10-CM | POA: Diagnosis not present

## 2017-03-04 DIAGNOSIS — I5032 Chronic diastolic (congestive) heart failure: Secondary | ICD-10-CM | POA: Diagnosis not present

## 2017-03-04 DIAGNOSIS — J449 Chronic obstructive pulmonary disease, unspecified: Secondary | ICD-10-CM | POA: Diagnosis not present

## 2017-03-04 DIAGNOSIS — E1122 Type 2 diabetes mellitus with diabetic chronic kidney disease: Secondary | ICD-10-CM | POA: Diagnosis not present

## 2017-03-05 ENCOUNTER — Ambulatory Visit: Payer: Medicare Other | Admitting: "Endocrinology

## 2017-03-05 ENCOUNTER — Other Ambulatory Visit: Payer: Self-pay | Admitting: "Endocrinology

## 2017-03-05 ENCOUNTER — Encounter: Payer: Self-pay | Admitting: "Endocrinology

## 2017-03-06 DIAGNOSIS — N183 Chronic kidney disease, stage 3 (moderate): Secondary | ICD-10-CM | POA: Diagnosis not present

## 2017-03-06 DIAGNOSIS — L304 Erythema intertrigo: Secondary | ICD-10-CM | POA: Diagnosis not present

## 2017-03-06 DIAGNOSIS — I5032 Chronic diastolic (congestive) heart failure: Secondary | ICD-10-CM | POA: Diagnosis not present

## 2017-03-06 DIAGNOSIS — Z794 Long term (current) use of insulin: Secondary | ICD-10-CM | POA: Diagnosis not present

## 2017-03-06 DIAGNOSIS — I130 Hypertensive heart and chronic kidney disease with heart failure and stage 1 through stage 4 chronic kidney disease, or unspecified chronic kidney disease: Secondary | ICD-10-CM | POA: Diagnosis not present

## 2017-03-06 DIAGNOSIS — E1122 Type 2 diabetes mellitus with diabetic chronic kidney disease: Secondary | ICD-10-CM | POA: Diagnosis not present

## 2017-03-06 DIAGNOSIS — J449 Chronic obstructive pulmonary disease, unspecified: Secondary | ICD-10-CM | POA: Diagnosis not present

## 2017-03-06 DIAGNOSIS — Z79810 Long term (current) use of selective estrogen receptor modulators (SERMs): Secondary | ICD-10-CM | POA: Diagnosis not present

## 2017-03-06 DIAGNOSIS — D631 Anemia in chronic kidney disease: Secondary | ICD-10-CM | POA: Diagnosis not present

## 2017-03-09 DIAGNOSIS — N183 Chronic kidney disease, stage 3 (moderate): Secondary | ICD-10-CM | POA: Diagnosis not present

## 2017-03-09 DIAGNOSIS — I5032 Chronic diastolic (congestive) heart failure: Secondary | ICD-10-CM | POA: Diagnosis not present

## 2017-03-09 DIAGNOSIS — E1122 Type 2 diabetes mellitus with diabetic chronic kidney disease: Secondary | ICD-10-CM | POA: Diagnosis not present

## 2017-03-09 DIAGNOSIS — Z79810 Long term (current) use of selective estrogen receptor modulators (SERMs): Secondary | ICD-10-CM | POA: Diagnosis not present

## 2017-03-09 DIAGNOSIS — D631 Anemia in chronic kidney disease: Secondary | ICD-10-CM | POA: Diagnosis not present

## 2017-03-09 DIAGNOSIS — J449 Chronic obstructive pulmonary disease, unspecified: Secondary | ICD-10-CM | POA: Diagnosis not present

## 2017-03-09 DIAGNOSIS — Z794 Long term (current) use of insulin: Secondary | ICD-10-CM | POA: Diagnosis not present

## 2017-03-09 DIAGNOSIS — I130 Hypertensive heart and chronic kidney disease with heart failure and stage 1 through stage 4 chronic kidney disease, or unspecified chronic kidney disease: Secondary | ICD-10-CM | POA: Diagnosis not present

## 2017-03-09 DIAGNOSIS — L304 Erythema intertrigo: Secondary | ICD-10-CM | POA: Diagnosis not present

## 2017-03-11 DIAGNOSIS — E1122 Type 2 diabetes mellitus with diabetic chronic kidney disease: Secondary | ICD-10-CM | POA: Diagnosis not present

## 2017-03-11 DIAGNOSIS — Z794 Long term (current) use of insulin: Secondary | ICD-10-CM | POA: Diagnosis not present

## 2017-03-11 DIAGNOSIS — I5032 Chronic diastolic (congestive) heart failure: Secondary | ICD-10-CM | POA: Diagnosis not present

## 2017-03-11 DIAGNOSIS — D631 Anemia in chronic kidney disease: Secondary | ICD-10-CM | POA: Diagnosis not present

## 2017-03-11 DIAGNOSIS — I130 Hypertensive heart and chronic kidney disease with heart failure and stage 1 through stage 4 chronic kidney disease, or unspecified chronic kidney disease: Secondary | ICD-10-CM | POA: Diagnosis not present

## 2017-03-11 DIAGNOSIS — N183 Chronic kidney disease, stage 3 (moderate): Secondary | ICD-10-CM | POA: Diagnosis not present

## 2017-03-11 DIAGNOSIS — J449 Chronic obstructive pulmonary disease, unspecified: Secondary | ICD-10-CM | POA: Diagnosis not present

## 2017-03-11 DIAGNOSIS — Z79810 Long term (current) use of selective estrogen receptor modulators (SERMs): Secondary | ICD-10-CM | POA: Diagnosis not present

## 2017-03-11 DIAGNOSIS — L304 Erythema intertrigo: Secondary | ICD-10-CM | POA: Diagnosis not present

## 2017-03-12 DIAGNOSIS — E1165 Type 2 diabetes mellitus with hyperglycemia: Secondary | ICD-10-CM | POA: Diagnosis not present

## 2017-03-13 DIAGNOSIS — Z794 Long term (current) use of insulin: Secondary | ICD-10-CM | POA: Diagnosis not present

## 2017-03-13 DIAGNOSIS — I5032 Chronic diastolic (congestive) heart failure: Secondary | ICD-10-CM | POA: Diagnosis not present

## 2017-03-13 DIAGNOSIS — E1122 Type 2 diabetes mellitus with diabetic chronic kidney disease: Secondary | ICD-10-CM | POA: Diagnosis not present

## 2017-03-13 DIAGNOSIS — Z79810 Long term (current) use of selective estrogen receptor modulators (SERMs): Secondary | ICD-10-CM | POA: Diagnosis not present

## 2017-03-13 DIAGNOSIS — L98492 Non-pressure chronic ulcer of skin of other sites with fat layer exposed: Secondary | ICD-10-CM | POA: Diagnosis not present

## 2017-03-13 DIAGNOSIS — Z79891 Long term (current) use of opiate analgesic: Secondary | ICD-10-CM | POA: Diagnosis not present

## 2017-03-13 DIAGNOSIS — I130 Hypertensive heart and chronic kidney disease with heart failure and stage 1 through stage 4 chronic kidney disease, or unspecified chronic kidney disease: Secondary | ICD-10-CM | POA: Diagnosis not present

## 2017-03-13 DIAGNOSIS — Z4800 Encounter for change or removal of nonsurgical wound dressing: Secondary | ICD-10-CM | POA: Diagnosis not present

## 2017-03-13 DIAGNOSIS — R2689 Other abnormalities of gait and mobility: Secondary | ICD-10-CM | POA: Diagnosis not present

## 2017-03-13 DIAGNOSIS — D631 Anemia in chronic kidney disease: Secondary | ICD-10-CM | POA: Diagnosis not present

## 2017-03-13 DIAGNOSIS — Z9981 Dependence on supplemental oxygen: Secondary | ICD-10-CM | POA: Diagnosis not present

## 2017-03-13 DIAGNOSIS — L304 Erythema intertrigo: Secondary | ICD-10-CM | POA: Diagnosis not present

## 2017-03-13 DIAGNOSIS — Z7982 Long term (current) use of aspirin: Secondary | ICD-10-CM | POA: Diagnosis not present

## 2017-03-13 DIAGNOSIS — J449 Chronic obstructive pulmonary disease, unspecified: Secondary | ICD-10-CM | POA: Diagnosis not present

## 2017-03-13 DIAGNOSIS — G894 Chronic pain syndrome: Secondary | ICD-10-CM | POA: Diagnosis not present

## 2017-03-13 DIAGNOSIS — N183 Chronic kidney disease, stage 3 (moderate): Secondary | ICD-10-CM | POA: Diagnosis not present

## 2017-03-15 DIAGNOSIS — L98492 Non-pressure chronic ulcer of skin of other sites with fat layer exposed: Secondary | ICD-10-CM | POA: Diagnosis not present

## 2017-03-15 DIAGNOSIS — Z79891 Long term (current) use of opiate analgesic: Secondary | ICD-10-CM | POA: Diagnosis not present

## 2017-03-15 DIAGNOSIS — J449 Chronic obstructive pulmonary disease, unspecified: Secondary | ICD-10-CM | POA: Diagnosis not present

## 2017-03-15 DIAGNOSIS — Z79810 Long term (current) use of selective estrogen receptor modulators (SERMs): Secondary | ICD-10-CM | POA: Diagnosis not present

## 2017-03-15 DIAGNOSIS — E1122 Type 2 diabetes mellitus with diabetic chronic kidney disease: Secondary | ICD-10-CM | POA: Diagnosis not present

## 2017-03-15 DIAGNOSIS — Z794 Long term (current) use of insulin: Secondary | ICD-10-CM | POA: Diagnosis not present

## 2017-03-15 DIAGNOSIS — Z7982 Long term (current) use of aspirin: Secondary | ICD-10-CM | POA: Diagnosis not present

## 2017-03-15 DIAGNOSIS — I5032 Chronic diastolic (congestive) heart failure: Secondary | ICD-10-CM | POA: Diagnosis not present

## 2017-03-15 DIAGNOSIS — Z9981 Dependence on supplemental oxygen: Secondary | ICD-10-CM | POA: Diagnosis not present

## 2017-03-15 DIAGNOSIS — N183 Chronic kidney disease, stage 3 (moderate): Secondary | ICD-10-CM | POA: Diagnosis not present

## 2017-03-15 DIAGNOSIS — D631 Anemia in chronic kidney disease: Secondary | ICD-10-CM | POA: Diagnosis not present

## 2017-03-15 DIAGNOSIS — Z4800 Encounter for change or removal of nonsurgical wound dressing: Secondary | ICD-10-CM | POA: Diagnosis not present

## 2017-03-15 DIAGNOSIS — I130 Hypertensive heart and chronic kidney disease with heart failure and stage 1 through stage 4 chronic kidney disease, or unspecified chronic kidney disease: Secondary | ICD-10-CM | POA: Diagnosis not present

## 2017-03-15 DIAGNOSIS — L304 Erythema intertrigo: Secondary | ICD-10-CM | POA: Diagnosis not present

## 2017-03-17 DIAGNOSIS — L304 Erythema intertrigo: Secondary | ICD-10-CM | POA: Diagnosis not present

## 2017-03-17 DIAGNOSIS — E1122 Type 2 diabetes mellitus with diabetic chronic kidney disease: Secondary | ICD-10-CM | POA: Diagnosis not present

## 2017-03-17 DIAGNOSIS — N183 Chronic kidney disease, stage 3 (moderate): Secondary | ICD-10-CM | POA: Diagnosis not present

## 2017-03-17 DIAGNOSIS — D631 Anemia in chronic kidney disease: Secondary | ICD-10-CM | POA: Diagnosis not present

## 2017-03-17 DIAGNOSIS — Z79810 Long term (current) use of selective estrogen receptor modulators (SERMs): Secondary | ICD-10-CM | POA: Diagnosis not present

## 2017-03-17 DIAGNOSIS — Z7982 Long term (current) use of aspirin: Secondary | ICD-10-CM | POA: Diagnosis not present

## 2017-03-17 DIAGNOSIS — J449 Chronic obstructive pulmonary disease, unspecified: Secondary | ICD-10-CM | POA: Diagnosis not present

## 2017-03-17 DIAGNOSIS — Z4800 Encounter for change or removal of nonsurgical wound dressing: Secondary | ICD-10-CM | POA: Diagnosis not present

## 2017-03-17 DIAGNOSIS — L98492 Non-pressure chronic ulcer of skin of other sites with fat layer exposed: Secondary | ICD-10-CM | POA: Diagnosis not present

## 2017-03-17 DIAGNOSIS — I5032 Chronic diastolic (congestive) heart failure: Secondary | ICD-10-CM | POA: Diagnosis not present

## 2017-03-17 DIAGNOSIS — Z79891 Long term (current) use of opiate analgesic: Secondary | ICD-10-CM | POA: Diagnosis not present

## 2017-03-17 DIAGNOSIS — Z9981 Dependence on supplemental oxygen: Secondary | ICD-10-CM | POA: Diagnosis not present

## 2017-03-17 DIAGNOSIS — Z794 Long term (current) use of insulin: Secondary | ICD-10-CM | POA: Diagnosis not present

## 2017-03-17 DIAGNOSIS — I130 Hypertensive heart and chronic kidney disease with heart failure and stage 1 through stage 4 chronic kidney disease, or unspecified chronic kidney disease: Secondary | ICD-10-CM | POA: Diagnosis not present

## 2017-03-20 DIAGNOSIS — Z7982 Long term (current) use of aspirin: Secondary | ICD-10-CM | POA: Diagnosis not present

## 2017-03-20 DIAGNOSIS — I5032 Chronic diastolic (congestive) heart failure: Secondary | ICD-10-CM | POA: Diagnosis not present

## 2017-03-20 DIAGNOSIS — Z794 Long term (current) use of insulin: Secondary | ICD-10-CM | POA: Diagnosis not present

## 2017-03-20 DIAGNOSIS — J449 Chronic obstructive pulmonary disease, unspecified: Secondary | ICD-10-CM | POA: Diagnosis not present

## 2017-03-20 DIAGNOSIS — Z79891 Long term (current) use of opiate analgesic: Secondary | ICD-10-CM | POA: Diagnosis not present

## 2017-03-20 DIAGNOSIS — N183 Chronic kidney disease, stage 3 (moderate): Secondary | ICD-10-CM | POA: Diagnosis not present

## 2017-03-20 DIAGNOSIS — I130 Hypertensive heart and chronic kidney disease with heart failure and stage 1 through stage 4 chronic kidney disease, or unspecified chronic kidney disease: Secondary | ICD-10-CM | POA: Diagnosis not present

## 2017-03-20 DIAGNOSIS — Z4800 Encounter for change or removal of nonsurgical wound dressing: Secondary | ICD-10-CM | POA: Diagnosis not present

## 2017-03-20 DIAGNOSIS — L98492 Non-pressure chronic ulcer of skin of other sites with fat layer exposed: Secondary | ICD-10-CM | POA: Diagnosis not present

## 2017-03-20 DIAGNOSIS — E1122 Type 2 diabetes mellitus with diabetic chronic kidney disease: Secondary | ICD-10-CM | POA: Diagnosis not present

## 2017-03-20 DIAGNOSIS — L304 Erythema intertrigo: Secondary | ICD-10-CM | POA: Diagnosis not present

## 2017-03-20 DIAGNOSIS — Z79810 Long term (current) use of selective estrogen receptor modulators (SERMs): Secondary | ICD-10-CM | POA: Diagnosis not present

## 2017-03-20 DIAGNOSIS — D631 Anemia in chronic kidney disease: Secondary | ICD-10-CM | POA: Diagnosis not present

## 2017-03-20 DIAGNOSIS — Z9981 Dependence on supplemental oxygen: Secondary | ICD-10-CM | POA: Diagnosis not present

## 2017-03-23 DIAGNOSIS — Z79891 Long term (current) use of opiate analgesic: Secondary | ICD-10-CM | POA: Diagnosis not present

## 2017-03-23 DIAGNOSIS — Z79810 Long term (current) use of selective estrogen receptor modulators (SERMs): Secondary | ICD-10-CM | POA: Diagnosis not present

## 2017-03-23 DIAGNOSIS — N183 Chronic kidney disease, stage 3 (moderate): Secondary | ICD-10-CM | POA: Diagnosis not present

## 2017-03-23 DIAGNOSIS — Z9981 Dependence on supplemental oxygen: Secondary | ICD-10-CM | POA: Diagnosis not present

## 2017-03-23 DIAGNOSIS — I130 Hypertensive heart and chronic kidney disease with heart failure and stage 1 through stage 4 chronic kidney disease, or unspecified chronic kidney disease: Secondary | ICD-10-CM | POA: Diagnosis not present

## 2017-03-23 DIAGNOSIS — L98492 Non-pressure chronic ulcer of skin of other sites with fat layer exposed: Secondary | ICD-10-CM | POA: Diagnosis not present

## 2017-03-23 DIAGNOSIS — L89152 Pressure ulcer of sacral region, stage 2: Secondary | ICD-10-CM | POA: Diagnosis not present

## 2017-03-23 DIAGNOSIS — Z7982 Long term (current) use of aspirin: Secondary | ICD-10-CM | POA: Diagnosis not present

## 2017-03-23 DIAGNOSIS — D631 Anemia in chronic kidney disease: Secondary | ICD-10-CM | POA: Diagnosis not present

## 2017-03-23 DIAGNOSIS — I5032 Chronic diastolic (congestive) heart failure: Secondary | ICD-10-CM | POA: Diagnosis not present

## 2017-03-23 DIAGNOSIS — E1122 Type 2 diabetes mellitus with diabetic chronic kidney disease: Secondary | ICD-10-CM | POA: Diagnosis not present

## 2017-03-23 DIAGNOSIS — J449 Chronic obstructive pulmonary disease, unspecified: Secondary | ICD-10-CM | POA: Diagnosis not present

## 2017-03-23 DIAGNOSIS — L304 Erythema intertrigo: Secondary | ICD-10-CM | POA: Diagnosis not present

## 2017-03-23 DIAGNOSIS — Z794 Long term (current) use of insulin: Secondary | ICD-10-CM | POA: Diagnosis not present

## 2017-03-23 DIAGNOSIS — Z4800 Encounter for change or removal of nonsurgical wound dressing: Secondary | ICD-10-CM | POA: Diagnosis not present

## 2017-03-25 ENCOUNTER — Other Ambulatory Visit: Payer: Self-pay | Admitting: "Endocrinology

## 2017-03-25 DIAGNOSIS — Z9981 Dependence on supplemental oxygen: Secondary | ICD-10-CM | POA: Diagnosis not present

## 2017-03-25 DIAGNOSIS — D631 Anemia in chronic kidney disease: Secondary | ICD-10-CM | POA: Diagnosis not present

## 2017-03-25 DIAGNOSIS — N183 Chronic kidney disease, stage 3 (moderate): Secondary | ICD-10-CM | POA: Diagnosis not present

## 2017-03-25 DIAGNOSIS — Z79810 Long term (current) use of selective estrogen receptor modulators (SERMs): Secondary | ICD-10-CM | POA: Diagnosis not present

## 2017-03-25 DIAGNOSIS — L304 Erythema intertrigo: Secondary | ICD-10-CM | POA: Diagnosis not present

## 2017-03-25 DIAGNOSIS — Z4800 Encounter for change or removal of nonsurgical wound dressing: Secondary | ICD-10-CM | POA: Diagnosis not present

## 2017-03-25 DIAGNOSIS — Z794 Long term (current) use of insulin: Secondary | ICD-10-CM | POA: Diagnosis not present

## 2017-03-25 DIAGNOSIS — Z79891 Long term (current) use of opiate analgesic: Secondary | ICD-10-CM | POA: Diagnosis not present

## 2017-03-25 DIAGNOSIS — J449 Chronic obstructive pulmonary disease, unspecified: Secondary | ICD-10-CM | POA: Diagnosis not present

## 2017-03-25 DIAGNOSIS — I130 Hypertensive heart and chronic kidney disease with heart failure and stage 1 through stage 4 chronic kidney disease, or unspecified chronic kidney disease: Secondary | ICD-10-CM | POA: Diagnosis not present

## 2017-03-25 DIAGNOSIS — I5032 Chronic diastolic (congestive) heart failure: Secondary | ICD-10-CM | POA: Diagnosis not present

## 2017-03-25 DIAGNOSIS — E1122 Type 2 diabetes mellitus with diabetic chronic kidney disease: Secondary | ICD-10-CM | POA: Diagnosis not present

## 2017-03-25 DIAGNOSIS — L98492 Non-pressure chronic ulcer of skin of other sites with fat layer exposed: Secondary | ICD-10-CM | POA: Diagnosis not present

## 2017-03-25 DIAGNOSIS — Z7982 Long term (current) use of aspirin: Secondary | ICD-10-CM | POA: Diagnosis not present

## 2017-03-27 DIAGNOSIS — E1122 Type 2 diabetes mellitus with diabetic chronic kidney disease: Secondary | ICD-10-CM | POA: Diagnosis not present

## 2017-03-27 DIAGNOSIS — Z9981 Dependence on supplemental oxygen: Secondary | ICD-10-CM | POA: Diagnosis not present

## 2017-03-27 DIAGNOSIS — Z79810 Long term (current) use of selective estrogen receptor modulators (SERMs): Secondary | ICD-10-CM | POA: Diagnosis not present

## 2017-03-27 DIAGNOSIS — D631 Anemia in chronic kidney disease: Secondary | ICD-10-CM | POA: Diagnosis not present

## 2017-03-27 DIAGNOSIS — E039 Hypothyroidism, unspecified: Secondary | ICD-10-CM | POA: Diagnosis not present

## 2017-03-27 DIAGNOSIS — I10 Essential (primary) hypertension: Secondary | ICD-10-CM | POA: Diagnosis not present

## 2017-03-27 DIAGNOSIS — L304 Erythema intertrigo: Secondary | ICD-10-CM | POA: Diagnosis not present

## 2017-03-27 DIAGNOSIS — N183 Chronic kidney disease, stage 3 (moderate): Secondary | ICD-10-CM | POA: Diagnosis not present

## 2017-03-27 DIAGNOSIS — I5032 Chronic diastolic (congestive) heart failure: Secondary | ICD-10-CM | POA: Diagnosis not present

## 2017-03-27 DIAGNOSIS — E1140 Type 2 diabetes mellitus with diabetic neuropathy, unspecified: Secondary | ICD-10-CM | POA: Diagnosis not present

## 2017-03-27 DIAGNOSIS — I130 Hypertensive heart and chronic kidney disease with heart failure and stage 1 through stage 4 chronic kidney disease, or unspecified chronic kidney disease: Secondary | ICD-10-CM | POA: Diagnosis not present

## 2017-03-27 DIAGNOSIS — Z7982 Long term (current) use of aspirin: Secondary | ICD-10-CM | POA: Diagnosis not present

## 2017-03-27 DIAGNOSIS — E782 Mixed hyperlipidemia: Secondary | ICD-10-CM | POA: Diagnosis not present

## 2017-03-27 DIAGNOSIS — Z79891 Long term (current) use of opiate analgesic: Secondary | ICD-10-CM | POA: Diagnosis not present

## 2017-03-27 DIAGNOSIS — L98492 Non-pressure chronic ulcer of skin of other sites with fat layer exposed: Secondary | ICD-10-CM | POA: Diagnosis not present

## 2017-03-27 DIAGNOSIS — J449 Chronic obstructive pulmonary disease, unspecified: Secondary | ICD-10-CM | POA: Diagnosis not present

## 2017-03-27 DIAGNOSIS — Z4800 Encounter for change or removal of nonsurgical wound dressing: Secondary | ICD-10-CM | POA: Diagnosis not present

## 2017-03-27 DIAGNOSIS — Z794 Long term (current) use of insulin: Secondary | ICD-10-CM | POA: Diagnosis not present

## 2017-03-30 DIAGNOSIS — J449 Chronic obstructive pulmonary disease, unspecified: Secondary | ICD-10-CM | POA: Diagnosis not present

## 2017-03-30 DIAGNOSIS — L304 Erythema intertrigo: Secondary | ICD-10-CM | POA: Diagnosis not present

## 2017-03-30 DIAGNOSIS — E1122 Type 2 diabetes mellitus with diabetic chronic kidney disease: Secondary | ICD-10-CM | POA: Diagnosis not present

## 2017-03-30 DIAGNOSIS — Z7982 Long term (current) use of aspirin: Secondary | ICD-10-CM | POA: Diagnosis not present

## 2017-03-30 DIAGNOSIS — Z79810 Long term (current) use of selective estrogen receptor modulators (SERMs): Secondary | ICD-10-CM | POA: Diagnosis not present

## 2017-03-30 DIAGNOSIS — Z794 Long term (current) use of insulin: Secondary | ICD-10-CM | POA: Diagnosis not present

## 2017-03-30 DIAGNOSIS — L98492 Non-pressure chronic ulcer of skin of other sites with fat layer exposed: Secondary | ICD-10-CM | POA: Diagnosis not present

## 2017-03-30 DIAGNOSIS — I5032 Chronic diastolic (congestive) heart failure: Secondary | ICD-10-CM | POA: Diagnosis not present

## 2017-03-30 DIAGNOSIS — N183 Chronic kidney disease, stage 3 (moderate): Secondary | ICD-10-CM | POA: Diagnosis not present

## 2017-03-30 DIAGNOSIS — I130 Hypertensive heart and chronic kidney disease with heart failure and stage 1 through stage 4 chronic kidney disease, or unspecified chronic kidney disease: Secondary | ICD-10-CM | POA: Diagnosis not present

## 2017-03-30 DIAGNOSIS — Z4800 Encounter for change or removal of nonsurgical wound dressing: Secondary | ICD-10-CM | POA: Diagnosis not present

## 2017-03-30 DIAGNOSIS — D631 Anemia in chronic kidney disease: Secondary | ICD-10-CM | POA: Diagnosis not present

## 2017-03-30 DIAGNOSIS — Z9981 Dependence on supplemental oxygen: Secondary | ICD-10-CM | POA: Diagnosis not present

## 2017-03-30 DIAGNOSIS — Z79891 Long term (current) use of opiate analgesic: Secondary | ICD-10-CM | POA: Diagnosis not present

## 2017-04-01 DIAGNOSIS — L304 Erythema intertrigo: Secondary | ICD-10-CM | POA: Diagnosis not present

## 2017-04-01 DIAGNOSIS — Z4800 Encounter for change or removal of nonsurgical wound dressing: Secondary | ICD-10-CM | POA: Diagnosis not present

## 2017-04-01 DIAGNOSIS — I130 Hypertensive heart and chronic kidney disease with heart failure and stage 1 through stage 4 chronic kidney disease, or unspecified chronic kidney disease: Secondary | ICD-10-CM | POA: Diagnosis not present

## 2017-04-01 DIAGNOSIS — J449 Chronic obstructive pulmonary disease, unspecified: Secondary | ICD-10-CM | POA: Diagnosis not present

## 2017-04-01 DIAGNOSIS — Z794 Long term (current) use of insulin: Secondary | ICD-10-CM | POA: Diagnosis not present

## 2017-04-01 DIAGNOSIS — E1122 Type 2 diabetes mellitus with diabetic chronic kidney disease: Secondary | ICD-10-CM | POA: Diagnosis not present

## 2017-04-01 DIAGNOSIS — I5032 Chronic diastolic (congestive) heart failure: Secondary | ICD-10-CM | POA: Diagnosis not present

## 2017-04-01 DIAGNOSIS — Z79891 Long term (current) use of opiate analgesic: Secondary | ICD-10-CM | POA: Diagnosis not present

## 2017-04-01 DIAGNOSIS — Z7982 Long term (current) use of aspirin: Secondary | ICD-10-CM | POA: Diagnosis not present

## 2017-04-01 DIAGNOSIS — L98492 Non-pressure chronic ulcer of skin of other sites with fat layer exposed: Secondary | ICD-10-CM | POA: Diagnosis not present

## 2017-04-01 DIAGNOSIS — Z79810 Long term (current) use of selective estrogen receptor modulators (SERMs): Secondary | ICD-10-CM | POA: Diagnosis not present

## 2017-04-01 DIAGNOSIS — D631 Anemia in chronic kidney disease: Secondary | ICD-10-CM | POA: Diagnosis not present

## 2017-04-01 DIAGNOSIS — Z9981 Dependence on supplemental oxygen: Secondary | ICD-10-CM | POA: Diagnosis not present

## 2017-04-01 DIAGNOSIS — N183 Chronic kidney disease, stage 3 (moderate): Secondary | ICD-10-CM | POA: Diagnosis not present

## 2017-04-02 DIAGNOSIS — J449 Chronic obstructive pulmonary disease, unspecified: Secondary | ICD-10-CM | POA: Diagnosis not present

## 2017-04-03 DIAGNOSIS — L98492 Non-pressure chronic ulcer of skin of other sites with fat layer exposed: Secondary | ICD-10-CM | POA: Diagnosis not present

## 2017-04-03 DIAGNOSIS — D631 Anemia in chronic kidney disease: Secondary | ICD-10-CM | POA: Diagnosis not present

## 2017-04-03 DIAGNOSIS — Z4800 Encounter for change or removal of nonsurgical wound dressing: Secondary | ICD-10-CM | POA: Diagnosis not present

## 2017-04-03 DIAGNOSIS — L304 Erythema intertrigo: Secondary | ICD-10-CM | POA: Diagnosis not present

## 2017-04-03 DIAGNOSIS — Z79891 Long term (current) use of opiate analgesic: Secondary | ICD-10-CM | POA: Diagnosis not present

## 2017-04-03 DIAGNOSIS — Z9981 Dependence on supplemental oxygen: Secondary | ICD-10-CM | POA: Diagnosis not present

## 2017-04-03 DIAGNOSIS — Z794 Long term (current) use of insulin: Secondary | ICD-10-CM | POA: Diagnosis not present

## 2017-04-03 DIAGNOSIS — E1122 Type 2 diabetes mellitus with diabetic chronic kidney disease: Secondary | ICD-10-CM | POA: Diagnosis not present

## 2017-04-03 DIAGNOSIS — Z79810 Long term (current) use of selective estrogen receptor modulators (SERMs): Secondary | ICD-10-CM | POA: Diagnosis not present

## 2017-04-03 DIAGNOSIS — J449 Chronic obstructive pulmonary disease, unspecified: Secondary | ICD-10-CM | POA: Diagnosis not present

## 2017-04-03 DIAGNOSIS — I130 Hypertensive heart and chronic kidney disease with heart failure and stage 1 through stage 4 chronic kidney disease, or unspecified chronic kidney disease: Secondary | ICD-10-CM | POA: Diagnosis not present

## 2017-04-03 DIAGNOSIS — I5032 Chronic diastolic (congestive) heart failure: Secondary | ICD-10-CM | POA: Diagnosis not present

## 2017-04-03 DIAGNOSIS — Z7982 Long term (current) use of aspirin: Secondary | ICD-10-CM | POA: Diagnosis not present

## 2017-04-03 DIAGNOSIS — N183 Chronic kidney disease, stage 3 (moderate): Secondary | ICD-10-CM | POA: Diagnosis not present

## 2017-04-06 DIAGNOSIS — Z7982 Long term (current) use of aspirin: Secondary | ICD-10-CM | POA: Diagnosis not present

## 2017-04-06 DIAGNOSIS — Z79810 Long term (current) use of selective estrogen receptor modulators (SERMs): Secondary | ICD-10-CM | POA: Diagnosis not present

## 2017-04-06 DIAGNOSIS — Z794 Long term (current) use of insulin: Secondary | ICD-10-CM | POA: Diagnosis not present

## 2017-04-06 DIAGNOSIS — I5032 Chronic diastolic (congestive) heart failure: Secondary | ICD-10-CM | POA: Diagnosis not present

## 2017-04-06 DIAGNOSIS — Z4800 Encounter for change or removal of nonsurgical wound dressing: Secondary | ICD-10-CM | POA: Diagnosis not present

## 2017-04-06 DIAGNOSIS — L98492 Non-pressure chronic ulcer of skin of other sites with fat layer exposed: Secondary | ICD-10-CM | POA: Diagnosis not present

## 2017-04-06 DIAGNOSIS — I130 Hypertensive heart and chronic kidney disease with heart failure and stage 1 through stage 4 chronic kidney disease, or unspecified chronic kidney disease: Secondary | ICD-10-CM | POA: Diagnosis not present

## 2017-04-06 DIAGNOSIS — J449 Chronic obstructive pulmonary disease, unspecified: Secondary | ICD-10-CM | POA: Diagnosis not present

## 2017-04-06 DIAGNOSIS — D631 Anemia in chronic kidney disease: Secondary | ICD-10-CM | POA: Diagnosis not present

## 2017-04-06 DIAGNOSIS — L304 Erythema intertrigo: Secondary | ICD-10-CM | POA: Diagnosis not present

## 2017-04-06 DIAGNOSIS — E1122 Type 2 diabetes mellitus with diabetic chronic kidney disease: Secondary | ICD-10-CM | POA: Diagnosis not present

## 2017-04-06 DIAGNOSIS — Z9981 Dependence on supplemental oxygen: Secondary | ICD-10-CM | POA: Diagnosis not present

## 2017-04-06 DIAGNOSIS — Z79891 Long term (current) use of opiate analgesic: Secondary | ICD-10-CM | POA: Diagnosis not present

## 2017-04-06 DIAGNOSIS — N183 Chronic kidney disease, stage 3 (moderate): Secondary | ICD-10-CM | POA: Diagnosis not present

## 2017-04-08 DIAGNOSIS — D631 Anemia in chronic kidney disease: Secondary | ICD-10-CM | POA: Diagnosis not present

## 2017-04-08 DIAGNOSIS — E1122 Type 2 diabetes mellitus with diabetic chronic kidney disease: Secondary | ICD-10-CM | POA: Diagnosis not present

## 2017-04-08 DIAGNOSIS — L304 Erythema intertrigo: Secondary | ICD-10-CM | POA: Diagnosis not present

## 2017-04-08 DIAGNOSIS — Z4800 Encounter for change or removal of nonsurgical wound dressing: Secondary | ICD-10-CM | POA: Diagnosis not present

## 2017-04-08 DIAGNOSIS — N183 Chronic kidney disease, stage 3 (moderate): Secondary | ICD-10-CM | POA: Diagnosis not present

## 2017-04-08 DIAGNOSIS — L98492 Non-pressure chronic ulcer of skin of other sites with fat layer exposed: Secondary | ICD-10-CM | POA: Diagnosis not present

## 2017-04-08 DIAGNOSIS — Z7982 Long term (current) use of aspirin: Secondary | ICD-10-CM | POA: Diagnosis not present

## 2017-04-08 DIAGNOSIS — Z79891 Long term (current) use of opiate analgesic: Secondary | ICD-10-CM | POA: Diagnosis not present

## 2017-04-08 DIAGNOSIS — Z79810 Long term (current) use of selective estrogen receptor modulators (SERMs): Secondary | ICD-10-CM | POA: Diagnosis not present

## 2017-04-08 DIAGNOSIS — Z9981 Dependence on supplemental oxygen: Secondary | ICD-10-CM | POA: Diagnosis not present

## 2017-04-08 DIAGNOSIS — Z794 Long term (current) use of insulin: Secondary | ICD-10-CM | POA: Diagnosis not present

## 2017-04-08 DIAGNOSIS — I5032 Chronic diastolic (congestive) heart failure: Secondary | ICD-10-CM | POA: Diagnosis not present

## 2017-04-08 DIAGNOSIS — J449 Chronic obstructive pulmonary disease, unspecified: Secondary | ICD-10-CM | POA: Diagnosis not present

## 2017-04-08 DIAGNOSIS — I130 Hypertensive heart and chronic kidney disease with heart failure and stage 1 through stage 4 chronic kidney disease, or unspecified chronic kidney disease: Secondary | ICD-10-CM | POA: Diagnosis not present

## 2017-04-10 DIAGNOSIS — J449 Chronic obstructive pulmonary disease, unspecified: Secondary | ICD-10-CM | POA: Diagnosis not present

## 2017-04-10 DIAGNOSIS — Z9981 Dependence on supplemental oxygen: Secondary | ICD-10-CM | POA: Diagnosis not present

## 2017-04-10 DIAGNOSIS — D631 Anemia in chronic kidney disease: Secondary | ICD-10-CM | POA: Diagnosis not present

## 2017-04-10 DIAGNOSIS — I5032 Chronic diastolic (congestive) heart failure: Secondary | ICD-10-CM | POA: Diagnosis not present

## 2017-04-10 DIAGNOSIS — L304 Erythema intertrigo: Secondary | ICD-10-CM | POA: Diagnosis not present

## 2017-04-10 DIAGNOSIS — Z794 Long term (current) use of insulin: Secondary | ICD-10-CM | POA: Diagnosis not present

## 2017-04-10 DIAGNOSIS — Z4800 Encounter for change or removal of nonsurgical wound dressing: Secondary | ICD-10-CM | POA: Diagnosis not present

## 2017-04-10 DIAGNOSIS — L98492 Non-pressure chronic ulcer of skin of other sites with fat layer exposed: Secondary | ICD-10-CM | POA: Diagnosis not present

## 2017-04-10 DIAGNOSIS — Z79891 Long term (current) use of opiate analgesic: Secondary | ICD-10-CM | POA: Diagnosis not present

## 2017-04-10 DIAGNOSIS — I130 Hypertensive heart and chronic kidney disease with heart failure and stage 1 through stage 4 chronic kidney disease, or unspecified chronic kidney disease: Secondary | ICD-10-CM | POA: Diagnosis not present

## 2017-04-10 DIAGNOSIS — N183 Chronic kidney disease, stage 3 (moderate): Secondary | ICD-10-CM | POA: Diagnosis not present

## 2017-04-10 DIAGNOSIS — Z7982 Long term (current) use of aspirin: Secondary | ICD-10-CM | POA: Diagnosis not present

## 2017-04-10 DIAGNOSIS — E1122 Type 2 diabetes mellitus with diabetic chronic kidney disease: Secondary | ICD-10-CM | POA: Diagnosis not present

## 2017-04-10 DIAGNOSIS — Z79810 Long term (current) use of selective estrogen receptor modulators (SERMs): Secondary | ICD-10-CM | POA: Diagnosis not present

## 2017-04-13 DIAGNOSIS — Z7982 Long term (current) use of aspirin: Secondary | ICD-10-CM | POA: Diagnosis not present

## 2017-04-13 DIAGNOSIS — L304 Erythema intertrigo: Secondary | ICD-10-CM | POA: Diagnosis not present

## 2017-04-13 DIAGNOSIS — I130 Hypertensive heart and chronic kidney disease with heart failure and stage 1 through stage 4 chronic kidney disease, or unspecified chronic kidney disease: Secondary | ICD-10-CM | POA: Diagnosis not present

## 2017-04-13 DIAGNOSIS — Z4800 Encounter for change or removal of nonsurgical wound dressing: Secondary | ICD-10-CM | POA: Diagnosis not present

## 2017-04-13 DIAGNOSIS — Z9981 Dependence on supplemental oxygen: Secondary | ICD-10-CM | POA: Diagnosis not present

## 2017-04-13 DIAGNOSIS — Z79810 Long term (current) use of selective estrogen receptor modulators (SERMs): Secondary | ICD-10-CM | POA: Diagnosis not present

## 2017-04-13 DIAGNOSIS — Z794 Long term (current) use of insulin: Secondary | ICD-10-CM | POA: Diagnosis not present

## 2017-04-13 DIAGNOSIS — E1122 Type 2 diabetes mellitus with diabetic chronic kidney disease: Secondary | ICD-10-CM | POA: Diagnosis not present

## 2017-04-13 DIAGNOSIS — L98492 Non-pressure chronic ulcer of skin of other sites with fat layer exposed: Secondary | ICD-10-CM | POA: Diagnosis not present

## 2017-04-13 DIAGNOSIS — I5032 Chronic diastolic (congestive) heart failure: Secondary | ICD-10-CM | POA: Diagnosis not present

## 2017-04-13 DIAGNOSIS — Z79891 Long term (current) use of opiate analgesic: Secondary | ICD-10-CM | POA: Diagnosis not present

## 2017-04-13 DIAGNOSIS — E1165 Type 2 diabetes mellitus with hyperglycemia: Secondary | ICD-10-CM | POA: Diagnosis not present

## 2017-04-13 DIAGNOSIS — J449 Chronic obstructive pulmonary disease, unspecified: Secondary | ICD-10-CM | POA: Diagnosis not present

## 2017-04-13 DIAGNOSIS — N183 Chronic kidney disease, stage 3 (moderate): Secondary | ICD-10-CM | POA: Diagnosis not present

## 2017-04-13 DIAGNOSIS — D631 Anemia in chronic kidney disease: Secondary | ICD-10-CM | POA: Diagnosis not present

## 2017-04-15 ENCOUNTER — Other Ambulatory Visit: Payer: Self-pay | Admitting: "Endocrinology

## 2017-04-15 ENCOUNTER — Telehealth: Payer: Self-pay

## 2017-04-15 ENCOUNTER — Ambulatory Visit: Payer: Self-pay | Admitting: "Endocrinology

## 2017-04-15 DIAGNOSIS — Z79810 Long term (current) use of selective estrogen receptor modulators (SERMs): Secondary | ICD-10-CM | POA: Diagnosis not present

## 2017-04-15 DIAGNOSIS — D631 Anemia in chronic kidney disease: Secondary | ICD-10-CM | POA: Diagnosis not present

## 2017-04-15 DIAGNOSIS — J449 Chronic obstructive pulmonary disease, unspecified: Secondary | ICD-10-CM | POA: Diagnosis not present

## 2017-04-15 DIAGNOSIS — L98492 Non-pressure chronic ulcer of skin of other sites with fat layer exposed: Secondary | ICD-10-CM | POA: Diagnosis not present

## 2017-04-15 DIAGNOSIS — Z7982 Long term (current) use of aspirin: Secondary | ICD-10-CM | POA: Diagnosis not present

## 2017-04-15 DIAGNOSIS — E1165 Type 2 diabetes mellitus with hyperglycemia: Secondary | ICD-10-CM

## 2017-04-15 DIAGNOSIS — Z9981 Dependence on supplemental oxygen: Secondary | ICD-10-CM | POA: Diagnosis not present

## 2017-04-15 DIAGNOSIS — Z79891 Long term (current) use of opiate analgesic: Secondary | ICD-10-CM | POA: Diagnosis not present

## 2017-04-15 DIAGNOSIS — E1122 Type 2 diabetes mellitus with diabetic chronic kidney disease: Secondary | ICD-10-CM | POA: Diagnosis not present

## 2017-04-15 DIAGNOSIS — I130 Hypertensive heart and chronic kidney disease with heart failure and stage 1 through stage 4 chronic kidney disease, or unspecified chronic kidney disease: Secondary | ICD-10-CM | POA: Diagnosis not present

## 2017-04-15 DIAGNOSIS — Z794 Long term (current) use of insulin: Secondary | ICD-10-CM | POA: Diagnosis not present

## 2017-04-15 DIAGNOSIS — I5032 Chronic diastolic (congestive) heart failure: Secondary | ICD-10-CM | POA: Diagnosis not present

## 2017-04-15 DIAGNOSIS — Z4800 Encounter for change or removal of nonsurgical wound dressing: Secondary | ICD-10-CM | POA: Diagnosis not present

## 2017-04-15 DIAGNOSIS — N183 Chronic kidney disease, stage 3 (moderate): Secondary | ICD-10-CM | POA: Diagnosis not present

## 2017-04-15 DIAGNOSIS — L304 Erythema intertrigo: Secondary | ICD-10-CM | POA: Diagnosis not present

## 2017-04-15 NOTE — Telephone Encounter (Signed)
Pt called screaming in the phone today stating that we will see her even though she is going to be late. After looking at the schedule I notified her that she wasn't on the schedule for today. Pt continued to scream in the phone that she was coming and we will see her. I advised pt that we would not talk to her while she continued to scream and advised her to call back.

## 2017-04-16 DIAGNOSIS — M130 Polyarthritis, unspecified: Secondary | ICD-10-CM | POA: Diagnosis not present

## 2017-04-16 DIAGNOSIS — M549 Dorsalgia, unspecified: Secondary | ICD-10-CM | POA: Diagnosis not present

## 2017-04-16 DIAGNOSIS — M5416 Radiculopathy, lumbar region: Secondary | ICD-10-CM | POA: Diagnosis not present

## 2017-04-16 DIAGNOSIS — R2689 Other abnormalities of gait and mobility: Secondary | ICD-10-CM | POA: Diagnosis not present

## 2017-04-16 DIAGNOSIS — M545 Low back pain: Secondary | ICD-10-CM | POA: Diagnosis not present

## 2017-04-16 DIAGNOSIS — Z79891 Long term (current) use of opiate analgesic: Secondary | ICD-10-CM | POA: Diagnosis not present

## 2017-04-17 DIAGNOSIS — D631 Anemia in chronic kidney disease: Secondary | ICD-10-CM | POA: Diagnosis not present

## 2017-04-17 DIAGNOSIS — N183 Chronic kidney disease, stage 3 (moderate): Secondary | ICD-10-CM | POA: Diagnosis not present

## 2017-04-17 DIAGNOSIS — Z7982 Long term (current) use of aspirin: Secondary | ICD-10-CM | POA: Diagnosis not present

## 2017-04-17 DIAGNOSIS — J449 Chronic obstructive pulmonary disease, unspecified: Secondary | ICD-10-CM | POA: Diagnosis not present

## 2017-04-17 DIAGNOSIS — Z79810 Long term (current) use of selective estrogen receptor modulators (SERMs): Secondary | ICD-10-CM | POA: Diagnosis not present

## 2017-04-17 DIAGNOSIS — L304 Erythema intertrigo: Secondary | ICD-10-CM | POA: Diagnosis not present

## 2017-04-17 DIAGNOSIS — Z4800 Encounter for change or removal of nonsurgical wound dressing: Secondary | ICD-10-CM | POA: Diagnosis not present

## 2017-04-17 DIAGNOSIS — I5032 Chronic diastolic (congestive) heart failure: Secondary | ICD-10-CM | POA: Diagnosis not present

## 2017-04-17 DIAGNOSIS — Z79891 Long term (current) use of opiate analgesic: Secondary | ICD-10-CM | POA: Diagnosis not present

## 2017-04-17 DIAGNOSIS — Z9981 Dependence on supplemental oxygen: Secondary | ICD-10-CM | POA: Diagnosis not present

## 2017-04-17 DIAGNOSIS — Z794 Long term (current) use of insulin: Secondary | ICD-10-CM | POA: Diagnosis not present

## 2017-04-17 DIAGNOSIS — L98492 Non-pressure chronic ulcer of skin of other sites with fat layer exposed: Secondary | ICD-10-CM | POA: Diagnosis not present

## 2017-04-17 DIAGNOSIS — E1122 Type 2 diabetes mellitus with diabetic chronic kidney disease: Secondary | ICD-10-CM | POA: Diagnosis not present

## 2017-04-17 DIAGNOSIS — I130 Hypertensive heart and chronic kidney disease with heart failure and stage 1 through stage 4 chronic kidney disease, or unspecified chronic kidney disease: Secondary | ICD-10-CM | POA: Diagnosis not present

## 2017-04-21 DIAGNOSIS — Z79891 Long term (current) use of opiate analgesic: Secondary | ICD-10-CM | POA: Diagnosis not present

## 2017-04-21 DIAGNOSIS — L98492 Non-pressure chronic ulcer of skin of other sites with fat layer exposed: Secondary | ICD-10-CM | POA: Diagnosis not present

## 2017-04-21 DIAGNOSIS — I5032 Chronic diastolic (congestive) heart failure: Secondary | ICD-10-CM | POA: Diagnosis not present

## 2017-04-21 DIAGNOSIS — Z9981 Dependence on supplemental oxygen: Secondary | ICD-10-CM | POA: Diagnosis not present

## 2017-04-21 DIAGNOSIS — L304 Erythema intertrigo: Secondary | ICD-10-CM | POA: Diagnosis not present

## 2017-04-21 DIAGNOSIS — Z79810 Long term (current) use of selective estrogen receptor modulators (SERMs): Secondary | ICD-10-CM | POA: Diagnosis not present

## 2017-04-21 DIAGNOSIS — Z4800 Encounter for change or removal of nonsurgical wound dressing: Secondary | ICD-10-CM | POA: Diagnosis not present

## 2017-04-21 DIAGNOSIS — N183 Chronic kidney disease, stage 3 (moderate): Secondary | ICD-10-CM | POA: Diagnosis not present

## 2017-04-21 DIAGNOSIS — I130 Hypertensive heart and chronic kidney disease with heart failure and stage 1 through stage 4 chronic kidney disease, or unspecified chronic kidney disease: Secondary | ICD-10-CM | POA: Diagnosis not present

## 2017-04-21 DIAGNOSIS — Z794 Long term (current) use of insulin: Secondary | ICD-10-CM | POA: Diagnosis not present

## 2017-04-21 DIAGNOSIS — J449 Chronic obstructive pulmonary disease, unspecified: Secondary | ICD-10-CM | POA: Diagnosis not present

## 2017-04-21 DIAGNOSIS — Z7982 Long term (current) use of aspirin: Secondary | ICD-10-CM | POA: Diagnosis not present

## 2017-04-21 DIAGNOSIS — E1122 Type 2 diabetes mellitus with diabetic chronic kidney disease: Secondary | ICD-10-CM | POA: Diagnosis not present

## 2017-04-21 DIAGNOSIS — D631 Anemia in chronic kidney disease: Secondary | ICD-10-CM | POA: Diagnosis not present

## 2017-04-22 DIAGNOSIS — N183 Chronic kidney disease, stage 3 (moderate): Secondary | ICD-10-CM | POA: Diagnosis not present

## 2017-04-22 DIAGNOSIS — Z794 Long term (current) use of insulin: Secondary | ICD-10-CM | POA: Diagnosis not present

## 2017-04-22 DIAGNOSIS — Z9981 Dependence on supplemental oxygen: Secondary | ICD-10-CM | POA: Diagnosis not present

## 2017-04-22 DIAGNOSIS — J449 Chronic obstructive pulmonary disease, unspecified: Secondary | ICD-10-CM | POA: Diagnosis not present

## 2017-04-22 DIAGNOSIS — L98492 Non-pressure chronic ulcer of skin of other sites with fat layer exposed: Secondary | ICD-10-CM | POA: Diagnosis not present

## 2017-04-22 DIAGNOSIS — Z4800 Encounter for change or removal of nonsurgical wound dressing: Secondary | ICD-10-CM | POA: Diagnosis not present

## 2017-04-22 DIAGNOSIS — Z79891 Long term (current) use of opiate analgesic: Secondary | ICD-10-CM | POA: Diagnosis not present

## 2017-04-22 DIAGNOSIS — E1122 Type 2 diabetes mellitus with diabetic chronic kidney disease: Secondary | ICD-10-CM | POA: Diagnosis not present

## 2017-04-22 DIAGNOSIS — Z79810 Long term (current) use of selective estrogen receptor modulators (SERMs): Secondary | ICD-10-CM | POA: Diagnosis not present

## 2017-04-22 DIAGNOSIS — Z7982 Long term (current) use of aspirin: Secondary | ICD-10-CM | POA: Diagnosis not present

## 2017-04-22 DIAGNOSIS — I5032 Chronic diastolic (congestive) heart failure: Secondary | ICD-10-CM | POA: Diagnosis not present

## 2017-04-22 DIAGNOSIS — L304 Erythema intertrigo: Secondary | ICD-10-CM | POA: Diagnosis not present

## 2017-04-22 DIAGNOSIS — I130 Hypertensive heart and chronic kidney disease with heart failure and stage 1 through stage 4 chronic kidney disease, or unspecified chronic kidney disease: Secondary | ICD-10-CM | POA: Diagnosis not present

## 2017-04-22 DIAGNOSIS — D631 Anemia in chronic kidney disease: Secondary | ICD-10-CM | POA: Diagnosis not present

## 2017-04-24 DIAGNOSIS — I5032 Chronic diastolic (congestive) heart failure: Secondary | ICD-10-CM | POA: Diagnosis not present

## 2017-04-24 DIAGNOSIS — D631 Anemia in chronic kidney disease: Secondary | ICD-10-CM | POA: Diagnosis not present

## 2017-04-24 DIAGNOSIS — N183 Chronic kidney disease, stage 3 (moderate): Secondary | ICD-10-CM | POA: Diagnosis not present

## 2017-04-24 DIAGNOSIS — Z79891 Long term (current) use of opiate analgesic: Secondary | ICD-10-CM | POA: Diagnosis not present

## 2017-04-24 DIAGNOSIS — E1122 Type 2 diabetes mellitus with diabetic chronic kidney disease: Secondary | ICD-10-CM | POA: Diagnosis not present

## 2017-04-24 DIAGNOSIS — Z4800 Encounter for change or removal of nonsurgical wound dressing: Secondary | ICD-10-CM | POA: Diagnosis not present

## 2017-04-24 DIAGNOSIS — Z9981 Dependence on supplemental oxygen: Secondary | ICD-10-CM | POA: Diagnosis not present

## 2017-04-24 DIAGNOSIS — L304 Erythema intertrigo: Secondary | ICD-10-CM | POA: Diagnosis not present

## 2017-04-24 DIAGNOSIS — Z79810 Long term (current) use of selective estrogen receptor modulators (SERMs): Secondary | ICD-10-CM | POA: Diagnosis not present

## 2017-04-24 DIAGNOSIS — Z7982 Long term (current) use of aspirin: Secondary | ICD-10-CM | POA: Diagnosis not present

## 2017-04-24 DIAGNOSIS — Z794 Long term (current) use of insulin: Secondary | ICD-10-CM | POA: Diagnosis not present

## 2017-04-24 DIAGNOSIS — J449 Chronic obstructive pulmonary disease, unspecified: Secondary | ICD-10-CM | POA: Diagnosis not present

## 2017-04-24 DIAGNOSIS — L98492 Non-pressure chronic ulcer of skin of other sites with fat layer exposed: Secondary | ICD-10-CM | POA: Diagnosis not present

## 2017-04-24 DIAGNOSIS — I130 Hypertensive heart and chronic kidney disease with heart failure and stage 1 through stage 4 chronic kidney disease, or unspecified chronic kidney disease: Secondary | ICD-10-CM | POA: Diagnosis not present

## 2017-04-27 ENCOUNTER — Other Ambulatory Visit (HOSPITAL_COMMUNITY)
Admission: RE | Admit: 2017-04-27 | Discharge: 2017-04-27 | Disposition: A | Discharge status: Home / Self Care | Payer: Medicare Other | Source: Other Acute Inpatient Hospital | Attending: Internal Medicine | Admitting: Internal Medicine

## 2017-04-27 DIAGNOSIS — Z79891 Long term (current) use of opiate analgesic: Secondary | ICD-10-CM | POA: Diagnosis not present

## 2017-04-27 DIAGNOSIS — I5032 Chronic diastolic (congestive) heart failure: Secondary | ICD-10-CM | POA: Diagnosis not present

## 2017-04-27 DIAGNOSIS — J449 Chronic obstructive pulmonary disease, unspecified: Secondary | ICD-10-CM | POA: Diagnosis not present

## 2017-04-27 DIAGNOSIS — E1122 Type 2 diabetes mellitus with diabetic chronic kidney disease: Secondary | ICD-10-CM | POA: Diagnosis not present

## 2017-04-27 DIAGNOSIS — N183 Chronic kidney disease, stage 3 (moderate): Secondary | ICD-10-CM | POA: Diagnosis not present

## 2017-04-27 DIAGNOSIS — I130 Hypertensive heart and chronic kidney disease with heart failure and stage 1 through stage 4 chronic kidney disease, or unspecified chronic kidney disease: Secondary | ICD-10-CM | POA: Diagnosis not present

## 2017-04-27 DIAGNOSIS — L98492 Non-pressure chronic ulcer of skin of other sites with fat layer exposed: Secondary | ICD-10-CM | POA: Diagnosis not present

## 2017-04-27 DIAGNOSIS — Z4800 Encounter for change or removal of nonsurgical wound dressing: Secondary | ICD-10-CM | POA: Diagnosis not present

## 2017-04-27 DIAGNOSIS — Z79810 Long term (current) use of selective estrogen receptor modulators (SERMs): Secondary | ICD-10-CM | POA: Diagnosis not present

## 2017-04-27 DIAGNOSIS — D631 Anemia in chronic kidney disease: Secondary | ICD-10-CM | POA: Diagnosis not present

## 2017-04-27 DIAGNOSIS — Z7982 Long term (current) use of aspirin: Secondary | ICD-10-CM | POA: Diagnosis not present

## 2017-04-27 DIAGNOSIS — Z794 Long term (current) use of insulin: Secondary | ICD-10-CM | POA: Diagnosis not present

## 2017-04-27 DIAGNOSIS — L304 Erythema intertrigo: Secondary | ICD-10-CM | POA: Diagnosis not present

## 2017-04-27 DIAGNOSIS — Z9981 Dependence on supplemental oxygen: Secondary | ICD-10-CM | POA: Diagnosis not present

## 2017-04-27 LAB — CBC WITH DIFFERENTIAL/PLATELET
Basophils Absolute: 0.1 10*3/uL (ref 0.0–0.1)
Basophils Relative: 1 %
Eosinophils Absolute: 0.2 10*3/uL (ref 0.0–0.7)
Eosinophils Relative: 2 %
HCT: 36.6 % (ref 36.0–46.0)
Hemoglobin: 10.8 g/dL — ABNORMAL LOW (ref 12.0–15.0)
Lymphocytes Relative: 19 %
Lymphs Abs: 1.5 10*3/uL (ref 0.7–4.0)
MCH: 26 pg (ref 26.0–34.0)
MCHC: 29.5 g/dL — ABNORMAL LOW (ref 30.0–36.0)
MCV: 88 fL (ref 78.0–100.0)
Monocytes Absolute: 0.5 10*3/uL (ref 0.1–1.0)
Monocytes Relative: 6 %
Neutro Abs: 5.7 10*3/uL (ref 1.7–7.7)
Neutrophils Relative %: 72 %
Platelets: 345 10*3/uL (ref 150–400)
RBC: 4.16 MIL/uL (ref 3.87–5.11)
RDW: 16.3 % — ABNORMAL HIGH (ref 11.5–15.5)
WBC: 7.9 10*3/uL (ref 4.0–10.5)

## 2017-04-27 LAB — BRAIN NATRIURETIC PEPTIDE: B Natriuretic Peptide: 188 pg/mL — ABNORMAL HIGH (ref 0.0–100.0)

## 2017-04-29 DIAGNOSIS — N183 Chronic kidney disease, stage 3 (moderate): Secondary | ICD-10-CM | POA: Diagnosis not present

## 2017-04-29 DIAGNOSIS — I5032 Chronic diastolic (congestive) heart failure: Secondary | ICD-10-CM | POA: Diagnosis not present

## 2017-04-29 DIAGNOSIS — Z7982 Long term (current) use of aspirin: Secondary | ICD-10-CM | POA: Diagnosis not present

## 2017-04-29 DIAGNOSIS — I130 Hypertensive heart and chronic kidney disease with heart failure and stage 1 through stage 4 chronic kidney disease, or unspecified chronic kidney disease: Secondary | ICD-10-CM | POA: Diagnosis not present

## 2017-04-29 DIAGNOSIS — J449 Chronic obstructive pulmonary disease, unspecified: Secondary | ICD-10-CM | POA: Diagnosis not present

## 2017-04-29 DIAGNOSIS — D631 Anemia in chronic kidney disease: Secondary | ICD-10-CM | POA: Diagnosis not present

## 2017-04-29 DIAGNOSIS — Z9981 Dependence on supplemental oxygen: Secondary | ICD-10-CM | POA: Diagnosis not present

## 2017-04-29 DIAGNOSIS — L304 Erythema intertrigo: Secondary | ICD-10-CM | POA: Diagnosis not present

## 2017-04-29 DIAGNOSIS — Z79810 Long term (current) use of selective estrogen receptor modulators (SERMs): Secondary | ICD-10-CM | POA: Diagnosis not present

## 2017-04-29 DIAGNOSIS — Z794 Long term (current) use of insulin: Secondary | ICD-10-CM | POA: Diagnosis not present

## 2017-04-29 DIAGNOSIS — E1122 Type 2 diabetes mellitus with diabetic chronic kidney disease: Secondary | ICD-10-CM | POA: Diagnosis not present

## 2017-04-29 DIAGNOSIS — Z79891 Long term (current) use of opiate analgesic: Secondary | ICD-10-CM | POA: Diagnosis not present

## 2017-04-29 DIAGNOSIS — L98492 Non-pressure chronic ulcer of skin of other sites with fat layer exposed: Secondary | ICD-10-CM | POA: Diagnosis not present

## 2017-04-29 DIAGNOSIS — Z4800 Encounter for change or removal of nonsurgical wound dressing: Secondary | ICD-10-CM | POA: Diagnosis not present

## 2017-04-30 ENCOUNTER — Ambulatory Visit: Payer: Self-pay | Admitting: "Endocrinology

## 2017-04-30 DIAGNOSIS — L304 Erythema intertrigo: Secondary | ICD-10-CM | POA: Diagnosis not present

## 2017-04-30 DIAGNOSIS — J449 Chronic obstructive pulmonary disease, unspecified: Secondary | ICD-10-CM | POA: Diagnosis not present

## 2017-04-30 DIAGNOSIS — D631 Anemia in chronic kidney disease: Secondary | ICD-10-CM | POA: Diagnosis not present

## 2017-04-30 DIAGNOSIS — I5032 Chronic diastolic (congestive) heart failure: Secondary | ICD-10-CM | POA: Diagnosis not present

## 2017-04-30 DIAGNOSIS — E1122 Type 2 diabetes mellitus with diabetic chronic kidney disease: Secondary | ICD-10-CM | POA: Diagnosis not present

## 2017-04-30 DIAGNOSIS — Z4800 Encounter for change or removal of nonsurgical wound dressing: Secondary | ICD-10-CM | POA: Diagnosis not present

## 2017-04-30 DIAGNOSIS — Z794 Long term (current) use of insulin: Secondary | ICD-10-CM | POA: Diagnosis not present

## 2017-04-30 DIAGNOSIS — Z9981 Dependence on supplemental oxygen: Secondary | ICD-10-CM | POA: Diagnosis not present

## 2017-04-30 DIAGNOSIS — Z7982 Long term (current) use of aspirin: Secondary | ICD-10-CM | POA: Diagnosis not present

## 2017-04-30 DIAGNOSIS — L98492 Non-pressure chronic ulcer of skin of other sites with fat layer exposed: Secondary | ICD-10-CM | POA: Diagnosis not present

## 2017-04-30 DIAGNOSIS — I130 Hypertensive heart and chronic kidney disease with heart failure and stage 1 through stage 4 chronic kidney disease, or unspecified chronic kidney disease: Secondary | ICD-10-CM | POA: Diagnosis not present

## 2017-04-30 DIAGNOSIS — N183 Chronic kidney disease, stage 3 (moderate): Secondary | ICD-10-CM | POA: Diagnosis not present

## 2017-04-30 DIAGNOSIS — Z79891 Long term (current) use of opiate analgesic: Secondary | ICD-10-CM | POA: Diagnosis not present

## 2017-04-30 DIAGNOSIS — Z79810 Long term (current) use of selective estrogen receptor modulators (SERMs): Secondary | ICD-10-CM | POA: Diagnosis not present

## 2017-05-01 ENCOUNTER — Other Ambulatory Visit (HOSPITAL_COMMUNITY)
Admission: RE | Admit: 2017-05-01 | Discharge: 2017-05-01 | Disposition: A | Discharge status: Home / Self Care | Payer: Medicare Other | Source: Other Acute Inpatient Hospital | Attending: Internal Medicine | Admitting: Internal Medicine

## 2017-05-01 DIAGNOSIS — I130 Hypertensive heart and chronic kidney disease with heart failure and stage 1 through stage 4 chronic kidney disease, or unspecified chronic kidney disease: Secondary | ICD-10-CM | POA: Diagnosis not present

## 2017-05-01 DIAGNOSIS — Z79891 Long term (current) use of opiate analgesic: Secondary | ICD-10-CM | POA: Diagnosis not present

## 2017-05-01 DIAGNOSIS — Z794 Long term (current) use of insulin: Secondary | ICD-10-CM | POA: Diagnosis not present

## 2017-05-01 DIAGNOSIS — D631 Anemia in chronic kidney disease: Secondary | ICD-10-CM | POA: Diagnosis not present

## 2017-05-01 DIAGNOSIS — I5032 Chronic diastolic (congestive) heart failure: Secondary | ICD-10-CM | POA: Insufficient documentation

## 2017-05-01 DIAGNOSIS — Z9981 Dependence on supplemental oxygen: Secondary | ICD-10-CM | POA: Diagnosis not present

## 2017-05-01 DIAGNOSIS — Z79810 Long term (current) use of selective estrogen receptor modulators (SERMs): Secondary | ICD-10-CM | POA: Diagnosis not present

## 2017-05-01 DIAGNOSIS — Z7982 Long term (current) use of aspirin: Secondary | ICD-10-CM | POA: Diagnosis not present

## 2017-05-01 DIAGNOSIS — E1122 Type 2 diabetes mellitus with diabetic chronic kidney disease: Secondary | ICD-10-CM | POA: Diagnosis not present

## 2017-05-01 DIAGNOSIS — Z4800 Encounter for change or removal of nonsurgical wound dressing: Secondary | ICD-10-CM | POA: Diagnosis not present

## 2017-05-01 DIAGNOSIS — J449 Chronic obstructive pulmonary disease, unspecified: Secondary | ICD-10-CM | POA: Diagnosis not present

## 2017-05-01 DIAGNOSIS — N183 Chronic kidney disease, stage 3 (moderate): Secondary | ICD-10-CM | POA: Diagnosis not present

## 2017-05-01 DIAGNOSIS — L304 Erythema intertrigo: Secondary | ICD-10-CM | POA: Diagnosis not present

## 2017-05-01 DIAGNOSIS — L98492 Non-pressure chronic ulcer of skin of other sites with fat layer exposed: Secondary | ICD-10-CM | POA: Diagnosis not present

## 2017-05-01 LAB — COMPREHENSIVE METABOLIC PANEL
ALT: 10 U/L — ABNORMAL LOW (ref 14–54)
AST: 12 U/L — ABNORMAL LOW (ref 15–41)
Albumin: 3.3 g/dL — ABNORMAL LOW (ref 3.5–5.0)
Alkaline Phosphatase: 86 U/L (ref 38–126)
Anion gap: 12 (ref 5–15)
BUN: 24 mg/dL — ABNORMAL HIGH (ref 6–20)
CO2: 25 mmol/L (ref 22–32)
Calcium: 9.1 mg/dL (ref 8.9–10.3)
Chloride: 101 mmol/L (ref 101–111)
Creatinine, Ser: 1.06 mg/dL — ABNORMAL HIGH (ref 0.44–1.00)
GFR calc Af Amer: >60 mL/min (ref >60)
GFR calc non Af Amer: 53 mL/min — ABNORMAL LOW (ref >60)
Glucose, Bld: 136 mg/dL — ABNORMAL HIGH (ref 65–99)
Potassium: 3.7 mmol/L (ref 3.5–5.1)
Sodium: 138 mmol/L (ref 135–145)
Total Bilirubin: 0.5 mg/dL (ref 0.3–1.2)
Total Protein: 6.7 g/dL (ref 6.5–8.1)

## 2017-05-01 LAB — BRAIN NATRIURETIC PEPTIDE: B Natriuretic Peptide: 108 pg/mL — ABNORMAL HIGH (ref 0.0–100.0)

## 2017-05-02 DIAGNOSIS — J449 Chronic obstructive pulmonary disease, unspecified: Secondary | ICD-10-CM | POA: Diagnosis not present

## 2017-05-03 DIAGNOSIS — Z9981 Dependence on supplemental oxygen: Secondary | ICD-10-CM | POA: Diagnosis not present

## 2017-05-03 DIAGNOSIS — N183 Chronic kidney disease, stage 3 (moderate): Secondary | ICD-10-CM | POA: Diagnosis not present

## 2017-05-03 DIAGNOSIS — L98492 Non-pressure chronic ulcer of skin of other sites with fat layer exposed: Secondary | ICD-10-CM | POA: Diagnosis not present

## 2017-05-03 DIAGNOSIS — I5032 Chronic diastolic (congestive) heart failure: Secondary | ICD-10-CM | POA: Diagnosis not present

## 2017-05-03 DIAGNOSIS — I130 Hypertensive heart and chronic kidney disease with heart failure and stage 1 through stage 4 chronic kidney disease, or unspecified chronic kidney disease: Secondary | ICD-10-CM | POA: Diagnosis not present

## 2017-05-03 DIAGNOSIS — L304 Erythema intertrigo: Secondary | ICD-10-CM | POA: Diagnosis not present

## 2017-05-03 DIAGNOSIS — D631 Anemia in chronic kidney disease: Secondary | ICD-10-CM | POA: Diagnosis not present

## 2017-05-03 DIAGNOSIS — J449 Chronic obstructive pulmonary disease, unspecified: Secondary | ICD-10-CM | POA: Diagnosis not present

## 2017-05-03 DIAGNOSIS — E1122 Type 2 diabetes mellitus with diabetic chronic kidney disease: Secondary | ICD-10-CM | POA: Diagnosis not present

## 2017-05-03 DIAGNOSIS — Z7982 Long term (current) use of aspirin: Secondary | ICD-10-CM | POA: Diagnosis not present

## 2017-05-03 DIAGNOSIS — Z79810 Long term (current) use of selective estrogen receptor modulators (SERMs): Secondary | ICD-10-CM | POA: Diagnosis not present

## 2017-05-03 DIAGNOSIS — Z79891 Long term (current) use of opiate analgesic: Secondary | ICD-10-CM | POA: Diagnosis not present

## 2017-05-03 DIAGNOSIS — Z794 Long term (current) use of insulin: Secondary | ICD-10-CM | POA: Diagnosis not present

## 2017-05-03 DIAGNOSIS — Z4800 Encounter for change or removal of nonsurgical wound dressing: Secondary | ICD-10-CM | POA: Diagnosis not present

## 2017-05-06 DIAGNOSIS — L98492 Non-pressure chronic ulcer of skin of other sites with fat layer exposed: Secondary | ICD-10-CM | POA: Diagnosis not present

## 2017-05-06 DIAGNOSIS — Z79891 Long term (current) use of opiate analgesic: Secondary | ICD-10-CM | POA: Diagnosis not present

## 2017-05-06 DIAGNOSIS — Z4800 Encounter for change or removal of nonsurgical wound dressing: Secondary | ICD-10-CM | POA: Diagnosis not present

## 2017-05-06 DIAGNOSIS — Z794 Long term (current) use of insulin: Secondary | ICD-10-CM | POA: Diagnosis not present

## 2017-05-06 DIAGNOSIS — D631 Anemia in chronic kidney disease: Secondary | ICD-10-CM | POA: Diagnosis not present

## 2017-05-06 DIAGNOSIS — E1122 Type 2 diabetes mellitus with diabetic chronic kidney disease: Secondary | ICD-10-CM | POA: Diagnosis not present

## 2017-05-06 DIAGNOSIS — L304 Erythema intertrigo: Secondary | ICD-10-CM | POA: Diagnosis not present

## 2017-05-06 DIAGNOSIS — Z79810 Long term (current) use of selective estrogen receptor modulators (SERMs): Secondary | ICD-10-CM | POA: Diagnosis not present

## 2017-05-06 DIAGNOSIS — Z9981 Dependence on supplemental oxygen: Secondary | ICD-10-CM | POA: Diagnosis not present

## 2017-05-06 DIAGNOSIS — I130 Hypertensive heart and chronic kidney disease with heart failure and stage 1 through stage 4 chronic kidney disease, or unspecified chronic kidney disease: Secondary | ICD-10-CM | POA: Diagnosis not present

## 2017-05-06 DIAGNOSIS — J449 Chronic obstructive pulmonary disease, unspecified: Secondary | ICD-10-CM | POA: Diagnosis not present

## 2017-05-06 DIAGNOSIS — Z7982 Long term (current) use of aspirin: Secondary | ICD-10-CM | POA: Diagnosis not present

## 2017-05-06 DIAGNOSIS — I5032 Chronic diastolic (congestive) heart failure: Secondary | ICD-10-CM | POA: Diagnosis not present

## 2017-05-06 DIAGNOSIS — N183 Chronic kidney disease, stage 3 (moderate): Secondary | ICD-10-CM | POA: Diagnosis not present

## 2017-05-08 DIAGNOSIS — L98492 Non-pressure chronic ulcer of skin of other sites with fat layer exposed: Secondary | ICD-10-CM | POA: Diagnosis not present

## 2017-05-08 DIAGNOSIS — L304 Erythema intertrigo: Secondary | ICD-10-CM | POA: Diagnosis not present

## 2017-05-08 DIAGNOSIS — Z79891 Long term (current) use of opiate analgesic: Secondary | ICD-10-CM | POA: Diagnosis not present

## 2017-05-08 DIAGNOSIS — E1122 Type 2 diabetes mellitus with diabetic chronic kidney disease: Secondary | ICD-10-CM | POA: Diagnosis not present

## 2017-05-08 DIAGNOSIS — J449 Chronic obstructive pulmonary disease, unspecified: Secondary | ICD-10-CM | POA: Diagnosis not present

## 2017-05-08 DIAGNOSIS — Z79810 Long term (current) use of selective estrogen receptor modulators (SERMs): Secondary | ICD-10-CM | POA: Diagnosis not present

## 2017-05-08 DIAGNOSIS — I130 Hypertensive heart and chronic kidney disease with heart failure and stage 1 through stage 4 chronic kidney disease, or unspecified chronic kidney disease: Secondary | ICD-10-CM | POA: Diagnosis not present

## 2017-05-08 DIAGNOSIS — Z7982 Long term (current) use of aspirin: Secondary | ICD-10-CM | POA: Diagnosis not present

## 2017-05-08 DIAGNOSIS — N183 Chronic kidney disease, stage 3 (moderate): Secondary | ICD-10-CM | POA: Diagnosis not present

## 2017-05-08 DIAGNOSIS — D631 Anemia in chronic kidney disease: Secondary | ICD-10-CM | POA: Diagnosis not present

## 2017-05-08 DIAGNOSIS — Z794 Long term (current) use of insulin: Secondary | ICD-10-CM | POA: Diagnosis not present

## 2017-05-08 DIAGNOSIS — L89152 Pressure ulcer of sacral region, stage 2: Secondary | ICD-10-CM | POA: Diagnosis not present

## 2017-05-08 DIAGNOSIS — Z9981 Dependence on supplemental oxygen: Secondary | ICD-10-CM | POA: Diagnosis not present

## 2017-05-08 DIAGNOSIS — Z4800 Encounter for change or removal of nonsurgical wound dressing: Secondary | ICD-10-CM | POA: Diagnosis not present

## 2017-05-08 DIAGNOSIS — I5032 Chronic diastolic (congestive) heart failure: Secondary | ICD-10-CM | POA: Diagnosis not present

## 2017-05-12 DIAGNOSIS — I130 Hypertensive heart and chronic kidney disease with heart failure and stage 1 through stage 4 chronic kidney disease, or unspecified chronic kidney disease: Secondary | ICD-10-CM | POA: Diagnosis not present

## 2017-05-12 DIAGNOSIS — L304 Erythema intertrigo: Secondary | ICD-10-CM | POA: Diagnosis not present

## 2017-05-12 DIAGNOSIS — Z79891 Long term (current) use of opiate analgesic: Secondary | ICD-10-CM | POA: Diagnosis not present

## 2017-05-12 DIAGNOSIS — Z79810 Long term (current) use of selective estrogen receptor modulators (SERMs): Secondary | ICD-10-CM | POA: Diagnosis not present

## 2017-05-12 DIAGNOSIS — Z4800 Encounter for change or removal of nonsurgical wound dressing: Secondary | ICD-10-CM | POA: Diagnosis not present

## 2017-05-12 DIAGNOSIS — Z7982 Long term (current) use of aspirin: Secondary | ICD-10-CM | POA: Diagnosis not present

## 2017-05-12 DIAGNOSIS — J449 Chronic obstructive pulmonary disease, unspecified: Secondary | ICD-10-CM | POA: Diagnosis not present

## 2017-05-12 DIAGNOSIS — Z794 Long term (current) use of insulin: Secondary | ICD-10-CM | POA: Diagnosis not present

## 2017-05-12 DIAGNOSIS — L98492 Non-pressure chronic ulcer of skin of other sites with fat layer exposed: Secondary | ICD-10-CM | POA: Diagnosis not present

## 2017-05-12 DIAGNOSIS — I5032 Chronic diastolic (congestive) heart failure: Secondary | ICD-10-CM | POA: Diagnosis not present

## 2017-05-12 DIAGNOSIS — L89152 Pressure ulcer of sacral region, stage 2: Secondary | ICD-10-CM | POA: Diagnosis not present

## 2017-05-12 DIAGNOSIS — Z9981 Dependence on supplemental oxygen: Secondary | ICD-10-CM | POA: Diagnosis not present

## 2017-05-12 DIAGNOSIS — N183 Chronic kidney disease, stage 3 (moderate): Secondary | ICD-10-CM | POA: Diagnosis not present

## 2017-05-12 DIAGNOSIS — E1122 Type 2 diabetes mellitus with diabetic chronic kidney disease: Secondary | ICD-10-CM | POA: Diagnosis not present

## 2017-05-12 DIAGNOSIS — D631 Anemia in chronic kidney disease: Secondary | ICD-10-CM | POA: Diagnosis not present

## 2017-05-13 ENCOUNTER — Other Ambulatory Visit: Payer: Self-pay | Admitting: "Endocrinology

## 2017-05-13 ENCOUNTER — Telehealth: Payer: Self-pay

## 2017-05-13 DIAGNOSIS — Z79891 Long term (current) use of opiate analgesic: Secondary | ICD-10-CM | POA: Diagnosis not present

## 2017-05-13 DIAGNOSIS — Z794 Long term (current) use of insulin: Secondary | ICD-10-CM | POA: Diagnosis not present

## 2017-05-13 DIAGNOSIS — L98492 Non-pressure chronic ulcer of skin of other sites with fat layer exposed: Secondary | ICD-10-CM | POA: Diagnosis not present

## 2017-05-13 DIAGNOSIS — Z9981 Dependence on supplemental oxygen: Secondary | ICD-10-CM | POA: Diagnosis not present

## 2017-05-13 DIAGNOSIS — I5032 Chronic diastolic (congestive) heart failure: Secondary | ICD-10-CM | POA: Diagnosis not present

## 2017-05-13 DIAGNOSIS — D631 Anemia in chronic kidney disease: Secondary | ICD-10-CM | POA: Diagnosis not present

## 2017-05-13 DIAGNOSIS — Z79810 Long term (current) use of selective estrogen receptor modulators (SERMs): Secondary | ICD-10-CM | POA: Diagnosis not present

## 2017-05-13 DIAGNOSIS — J449 Chronic obstructive pulmonary disease, unspecified: Secondary | ICD-10-CM | POA: Diagnosis not present

## 2017-05-13 DIAGNOSIS — Z7982 Long term (current) use of aspirin: Secondary | ICD-10-CM | POA: Diagnosis not present

## 2017-05-13 DIAGNOSIS — I130 Hypertensive heart and chronic kidney disease with heart failure and stage 1 through stage 4 chronic kidney disease, or unspecified chronic kidney disease: Secondary | ICD-10-CM | POA: Diagnosis not present

## 2017-05-13 DIAGNOSIS — L89152 Pressure ulcer of sacral region, stage 2: Secondary | ICD-10-CM | POA: Diagnosis not present

## 2017-05-13 DIAGNOSIS — E1122 Type 2 diabetes mellitus with diabetic chronic kidney disease: Secondary | ICD-10-CM | POA: Diagnosis not present

## 2017-05-13 DIAGNOSIS — Z4800 Encounter for change or removal of nonsurgical wound dressing: Secondary | ICD-10-CM | POA: Diagnosis not present

## 2017-05-13 DIAGNOSIS — N183 Chronic kidney disease, stage 3 (moderate): Secondary | ICD-10-CM | POA: Diagnosis not present

## 2017-05-13 DIAGNOSIS — L304 Erythema intertrigo: Secondary | ICD-10-CM | POA: Diagnosis not present

## 2017-05-13 MED ORDER — INSULIN LISPRO 100 UNIT/ML ~~LOC~~ SOLN: SUBCUTANEOUS | 0 refills | Status: DC

## 2017-05-13 MED ORDER — INSULIN DETEMIR 100 UNIT/ML ~~LOC~~ SOLN: SUBCUTANEOUS | 0 refills | Status: DC

## 2017-05-13 NOTE — Telephone Encounter (Signed)
Pt called screaming in the phone that she was having an emergency. Her BG is 500. I advised her to go to the ER. She refused. She is wanting refills but states that she cannot come for a office visit. She states it will be 4 more months before she might be able to get here. Last appt was 7-18.  I again advised her to go to the ER and she again refused.

## 2017-05-13 NOTE — Telephone Encounter (Signed)
That is the right recommendation. Since she missed her appointments, it is difficult to help her based on one reading of 500. She needs ER visit, and can reshcedule her office visit , if she wants.

## 2017-05-13 NOTE — Telephone Encounter (Signed)
Pt notified. Pt states that even though her BG readings are extremely high she refuses to go to the ER and states that she will not make an appt. With Dr Dorris Fetch. No refills authorized.

## 2017-05-14 DIAGNOSIS — I130 Hypertensive heart and chronic kidney disease with heart failure and stage 1 through stage 4 chronic kidney disease, or unspecified chronic kidney disease: Secondary | ICD-10-CM | POA: Diagnosis not present

## 2017-05-14 DIAGNOSIS — J449 Chronic obstructive pulmonary disease, unspecified: Secondary | ICD-10-CM | POA: Diagnosis not present

## 2017-05-14 DIAGNOSIS — Z79891 Long term (current) use of opiate analgesic: Secondary | ICD-10-CM | POA: Diagnosis not present

## 2017-05-14 DIAGNOSIS — E1122 Type 2 diabetes mellitus with diabetic chronic kidney disease: Secondary | ICD-10-CM | POA: Diagnosis not present

## 2017-05-14 DIAGNOSIS — Z9981 Dependence on supplemental oxygen: Secondary | ICD-10-CM | POA: Diagnosis not present

## 2017-05-14 DIAGNOSIS — I5032 Chronic diastolic (congestive) heart failure: Secondary | ICD-10-CM | POA: Diagnosis not present

## 2017-05-14 DIAGNOSIS — L89152 Pressure ulcer of sacral region, stage 2: Secondary | ICD-10-CM | POA: Diagnosis not present

## 2017-05-14 DIAGNOSIS — Z7982 Long term (current) use of aspirin: Secondary | ICD-10-CM | POA: Diagnosis not present

## 2017-05-14 DIAGNOSIS — Z794 Long term (current) use of insulin: Secondary | ICD-10-CM | POA: Diagnosis not present

## 2017-05-14 DIAGNOSIS — L304 Erythema intertrigo: Secondary | ICD-10-CM | POA: Diagnosis not present

## 2017-05-14 DIAGNOSIS — N183 Chronic kidney disease, stage 3 (moderate): Secondary | ICD-10-CM | POA: Diagnosis not present

## 2017-05-14 DIAGNOSIS — Z79810 Long term (current) use of selective estrogen receptor modulators (SERMs): Secondary | ICD-10-CM | POA: Diagnosis not present

## 2017-05-14 DIAGNOSIS — L98492 Non-pressure chronic ulcer of skin of other sites with fat layer exposed: Secondary | ICD-10-CM | POA: Diagnosis not present

## 2017-05-14 DIAGNOSIS — D631 Anemia in chronic kidney disease: Secondary | ICD-10-CM | POA: Diagnosis not present

## 2017-05-14 DIAGNOSIS — Z4800 Encounter for change or removal of nonsurgical wound dressing: Secondary | ICD-10-CM | POA: Diagnosis not present

## 2017-05-15 ENCOUNTER — Encounter (HOSPITAL_COMMUNITY): Payer: Self-pay | Admitting: *Deleted

## 2017-05-15 ENCOUNTER — Inpatient Hospital Stay (HOSPITAL_COMMUNITY): Payer: Medicare Other

## 2017-05-15 ENCOUNTER — Inpatient Hospital Stay (HOSPITAL_COMMUNITY)
Admission: AD | Admit: 2017-05-15 | Discharge: 2017-05-26 | DRG: 246 | Disposition: A | Discharge status: Home Health | Payer: Medicare Other | Source: Ambulatory Visit | Attending: Internal Medicine | Admitting: Internal Medicine

## 2017-05-15 DIAGNOSIS — E039 Hypothyroidism, unspecified: Secondary | ICD-10-CM | POA: Diagnosis not present

## 2017-05-15 DIAGNOSIS — F329 Major depressive disorder, single episode, unspecified: Secondary | ICD-10-CM | POA: Diagnosis present

## 2017-05-15 DIAGNOSIS — K30 Functional dyspepsia: Secondary | ICD-10-CM | POA: Diagnosis not present

## 2017-05-15 DIAGNOSIS — Z66 Do not resuscitate: Secondary | ICD-10-CM | POA: Diagnosis not present

## 2017-05-15 DIAGNOSIS — D649 Anemia, unspecified: Secondary | ICD-10-CM | POA: Diagnosis not present

## 2017-05-15 DIAGNOSIS — I200 Unstable angina: Secondary | ICD-10-CM | POA: Diagnosis not present

## 2017-05-15 DIAGNOSIS — K5900 Constipation, unspecified: Secondary | ICD-10-CM | POA: Diagnosis not present

## 2017-05-15 DIAGNOSIS — I208 Other forms of angina pectoris: Secondary | ICD-10-CM | POA: Diagnosis not present

## 2017-05-15 DIAGNOSIS — R0602 Shortness of breath: Secondary | ICD-10-CM

## 2017-05-15 DIAGNOSIS — I272 Pulmonary hypertension, unspecified: Secondary | ICD-10-CM | POA: Diagnosis present

## 2017-05-15 DIAGNOSIS — Z87891 Personal history of nicotine dependence: Secondary | ICD-10-CM

## 2017-05-15 DIAGNOSIS — I2510 Atherosclerotic heart disease of native coronary artery without angina pectoris: Secondary | ICD-10-CM | POA: Diagnosis not present

## 2017-05-15 DIAGNOSIS — R072 Precordial pain: Secondary | ICD-10-CM | POA: Diagnosis not present

## 2017-05-15 DIAGNOSIS — L929 Granulomatous disorder of the skin and subcutaneous tissue, unspecified: Secondary | ICD-10-CM | POA: Diagnosis present

## 2017-05-15 DIAGNOSIS — F419 Anxiety disorder, unspecified: Secondary | ICD-10-CM

## 2017-05-15 DIAGNOSIS — N179 Acute kidney failure, unspecified: Secondary | ICD-10-CM | POA: Diagnosis not present

## 2017-05-15 DIAGNOSIS — R079 Chest pain, unspecified: Secondary | ICD-10-CM | POA: Diagnosis not present

## 2017-05-15 DIAGNOSIS — Z794 Long term (current) use of insulin: Secondary | ICD-10-CM | POA: Diagnosis not present

## 2017-05-15 DIAGNOSIS — E1165 Type 2 diabetes mellitus with hyperglycemia: Secondary | ICD-10-CM | POA: Diagnosis not present

## 2017-05-15 DIAGNOSIS — E119 Type 2 diabetes mellitus without complications: Secondary | ICD-10-CM

## 2017-05-15 DIAGNOSIS — M549 Dorsalgia, unspecified: Secondary | ICD-10-CM | POA: Diagnosis present

## 2017-05-15 DIAGNOSIS — L98492 Non-pressure chronic ulcer of skin of other sites with fat layer exposed: Secondary | ICD-10-CM | POA: Diagnosis not present

## 2017-05-15 DIAGNOSIS — Z885 Allergy status to narcotic agent status: Secondary | ICD-10-CM | POA: Diagnosis not present

## 2017-05-15 DIAGNOSIS — J441 Chronic obstructive pulmonary disease with (acute) exacerbation: Secondary | ICD-10-CM | POA: Diagnosis present

## 2017-05-15 DIAGNOSIS — Z9981 Dependence on supplemental oxygen: Secondary | ICD-10-CM | POA: Diagnosis not present

## 2017-05-15 DIAGNOSIS — R262 Difficulty in walking, not elsewhere classified: Secondary | ICD-10-CM | POA: Diagnosis present

## 2017-05-15 DIAGNOSIS — I25118 Atherosclerotic heart disease of native coronary artery with other forms of angina pectoris: Principal | ICD-10-CM | POA: Diagnosis present

## 2017-05-15 DIAGNOSIS — Z880 Allergy status to penicillin: Secondary | ICD-10-CM

## 2017-05-15 DIAGNOSIS — I10 Essential (primary) hypertension: Secondary | ICD-10-CM | POA: Diagnosis not present

## 2017-05-15 DIAGNOSIS — Z6841 Body Mass Index (BMI) 40.0 and over, adult: Secondary | ICD-10-CM

## 2017-05-15 DIAGNOSIS — L89152 Pressure ulcer of sacral region, stage 2: Secondary | ICD-10-CM | POA: Diagnosis not present

## 2017-05-15 DIAGNOSIS — R9439 Abnormal result of other cardiovascular function study: Secondary | ICD-10-CM | POA: Diagnosis present

## 2017-05-15 DIAGNOSIS — I130 Hypertensive heart and chronic kidney disease with heart failure and stage 1 through stage 4 chronic kidney disease, or unspecified chronic kidney disease: Secondary | ICD-10-CM | POA: Diagnosis not present

## 2017-05-15 DIAGNOSIS — J449 Chronic obstructive pulmonary disease, unspecified: Secondary | ICD-10-CM | POA: Diagnosis not present

## 2017-05-15 DIAGNOSIS — Z4800 Encounter for change or removal of nonsurgical wound dressing: Secondary | ICD-10-CM | POA: Diagnosis not present

## 2017-05-15 DIAGNOSIS — Z7982 Long term (current) use of aspirin: Secondary | ICD-10-CM

## 2017-05-15 DIAGNOSIS — E1122 Type 2 diabetes mellitus with diabetic chronic kidney disease: Secondary | ICD-10-CM | POA: Diagnosis present

## 2017-05-15 DIAGNOSIS — N183 Chronic kidney disease, stage 3 (moderate): Secondary | ICD-10-CM | POA: Diagnosis present

## 2017-05-15 DIAGNOSIS — R0789 Other chest pain: Secondary | ICD-10-CM | POA: Diagnosis not present

## 2017-05-15 DIAGNOSIS — R0600 Dyspnea, unspecified: Secondary | ICD-10-CM

## 2017-05-15 DIAGNOSIS — R609 Edema, unspecified: Secondary | ICD-10-CM | POA: Diagnosis present

## 2017-05-15 DIAGNOSIS — E785 Hyperlipidemia, unspecified: Secondary | ICD-10-CM | POA: Diagnosis present

## 2017-05-15 DIAGNOSIS — Z79810 Long term (current) use of selective estrogen receptor modulators (SERMs): Secondary | ICD-10-CM | POA: Diagnosis not present

## 2017-05-15 DIAGNOSIS — Z9862 Peripheral vascular angioplasty status: Secondary | ICD-10-CM | POA: Diagnosis not present

## 2017-05-15 DIAGNOSIS — Z9861 Coronary angioplasty status: Secondary | ICD-10-CM | POA: Diagnosis not present

## 2017-05-15 DIAGNOSIS — L304 Erythema intertrigo: Secondary | ICD-10-CM | POA: Diagnosis not present

## 2017-05-15 DIAGNOSIS — Z79890 Hormone replacement therapy (postmenopausal): Secondary | ICD-10-CM

## 2017-05-15 DIAGNOSIS — I25119 Atherosclerotic heart disease of native coronary artery with unspecified angina pectoris: Secondary | ICD-10-CM | POA: Diagnosis not present

## 2017-05-15 DIAGNOSIS — I5033 Acute on chronic diastolic (congestive) heart failure: Secondary | ICD-10-CM | POA: Diagnosis not present

## 2017-05-15 DIAGNOSIS — Z833 Family history of diabetes mellitus: Secondary | ICD-10-CM

## 2017-05-15 DIAGNOSIS — R600 Localized edema: Secondary | ICD-10-CM | POA: Diagnosis not present

## 2017-05-15 DIAGNOSIS — D631 Anemia in chronic kidney disease: Secondary | ICD-10-CM | POA: Diagnosis not present

## 2017-05-15 DIAGNOSIS — I5030 Unspecified diastolic (congestive) heart failure: Secondary | ICD-10-CM | POA: Diagnosis not present

## 2017-05-15 DIAGNOSIS — E1140 Type 2 diabetes mellitus with diabetic neuropathy, unspecified: Secondary | ICD-10-CM | POA: Diagnosis not present

## 2017-05-15 DIAGNOSIS — Z823 Family history of stroke: Secondary | ICD-10-CM

## 2017-05-15 DIAGNOSIS — I5032 Chronic diastolic (congestive) heart failure: Secondary | ICD-10-CM | POA: Diagnosis not present

## 2017-05-15 DIAGNOSIS — Z79891 Long term (current) use of opiate analgesic: Secondary | ICD-10-CM | POA: Diagnosis not present

## 2017-05-15 DIAGNOSIS — Z8249 Family history of ischemic heart disease and other diseases of the circulatory system: Secondary | ICD-10-CM

## 2017-05-15 DIAGNOSIS — G8929 Other chronic pain: Secondary | ICD-10-CM | POA: Diagnosis present

## 2017-05-15 LAB — CBC
HCT: 38 % (ref 36.0–46.0)
Hemoglobin: 11.4 g/dL — ABNORMAL LOW (ref 12.0–15.0)
MCH: 25.6 pg — ABNORMAL LOW (ref 26.0–34.0)
MCHC: 30 g/dL (ref 30.0–36.0)
MCV: 85.2 fL (ref 78.0–100.0)
Platelets: 333 10*3/uL (ref 150–400)
RBC: 4.46 MIL/uL (ref 3.87–5.11)
RDW: 15.9 % — ABNORMAL HIGH (ref 11.5–15.5)
WBC: 7.2 10*3/uL (ref 4.0–10.5)

## 2017-05-15 LAB — COMPREHENSIVE METABOLIC PANEL
ALT: 11 U/L — ABNORMAL LOW (ref 14–54)
AST: 16 U/L (ref 15–41)
Albumin: 3.6 g/dL (ref 3.5–5.0)
Alkaline Phosphatase: 82 U/L (ref 38–126)
Anion gap: 7 (ref 5–15)
BUN: 29 mg/dL — ABNORMAL HIGH (ref 6–20)
CO2: 29 mmol/L (ref 22–32)
Calcium: 9 mg/dL (ref 8.9–10.3)
Chloride: 101 mmol/L (ref 101–111)
Creatinine, Ser: 1.21 mg/dL — ABNORMAL HIGH (ref 0.44–1.00)
GFR calc Af Amer: 52 mL/min — ABNORMAL LOW (ref >60)
GFR calc non Af Amer: 45 mL/min — ABNORMAL LOW (ref >60)
Glucose, Bld: 179 mg/dL — ABNORMAL HIGH (ref 65–99)
Potassium: 4 mmol/L (ref 3.5–5.1)
Sodium: 137 mmol/L (ref 135–145)
Total Bilirubin: 0.5 mg/dL (ref 0.3–1.2)
Total Protein: 7 g/dL (ref 6.5–8.1)

## 2017-05-15 LAB — URINALYSIS, ROUTINE W REFLEX MICROSCOPIC
Bilirubin Urine: NEGATIVE
Glucose, UA: 50 mg/dL — AB
Ketones, ur: NEGATIVE mg/dL
Leukocytes, UA: NEGATIVE
Nitrite: NEGATIVE
Protein, ur: NEGATIVE mg/dL
Specific Gravity, Urine: 1.006 (ref 1.005–1.030)
pH: 7 (ref 5.0–8.0)

## 2017-05-15 LAB — GLUCOSE, CAPILLARY
Glucose-Capillary: 166 mg/dL — ABNORMAL HIGH (ref 65–99)
Glucose-Capillary: 253 mg/dL — ABNORMAL HIGH (ref 65–99)

## 2017-05-15 LAB — HEMOGLOBIN A1C
Hgb A1c MFr Bld: 7.7 % — ABNORMAL HIGH (ref 4.8–5.6)
Mean Plasma Glucose: 174.29 mg/dL

## 2017-05-15 LAB — TROPONIN I: Troponin I: <0.03 ng/mL (ref <0.03)

## 2017-05-15 LAB — TSH: TSH: 0.32 u[IU]/mL — ABNORMAL LOW (ref 0.350–4.500)

## 2017-05-15 MED ORDER — INSULIN DETEMIR 100 UNIT/ML ~~LOC~~ SOLN
50.0000 [IU] | Freq: Every day | SUBCUTANEOUS | Status: DC
Start: 2017-05-15 — End: 2017-05-23
  Administered 2017-05-15 – 2017-05-22 (×8): 50 [IU] via SUBCUTANEOUS
  Filled 2017-05-15 (×9): qty 0.5

## 2017-05-15 MED ORDER — ENOXAPARIN SODIUM 80 MG/0.8ML ~~LOC~~ SOLN
0.5000 mg/kg | SUBCUTANEOUS | Status: DC
  Administered 2017-05-15 – 2017-05-18 (×4): 65 mg via SUBCUTANEOUS
  Filled 2017-05-15 (×4): qty 0.8

## 2017-05-15 MED ORDER — VITAMIN D 1000 UNITS PO TABS
5000.0000 [IU] | ORAL_TABLET | Freq: Every day | ORAL | Status: DC
  Administered 2017-05-16 – 2017-05-26 (×10): 5000 [IU] via ORAL
  Filled 2017-05-15 (×11): qty 5

## 2017-05-15 MED ORDER — ALBUTEROL SULFATE (2.5 MG/3ML) 0.083% IN NEBU: 2.5000 mg | INHALATION_SOLUTION | RESPIRATORY_TRACT | Status: DC | PRN

## 2017-05-15 MED ORDER — PANTOPRAZOLE SODIUM 40 MG PO TBEC
40.0000 mg | DELAYED_RELEASE_TABLET | Freq: Every day | ORAL | Status: DC
  Administered 2017-05-16 – 2017-05-26 (×10): 40 mg via ORAL
  Filled 2017-05-15 (×10): qty 1

## 2017-05-15 MED ORDER — SODIUM CHLORIDE 0.9 % IV SOLN: 250.0000 mL | INTRAVENOUS | Status: DC | PRN

## 2017-05-15 MED ORDER — SODIUM CHLORIDE 0.9% FLUSH
3.0000 mL | Freq: Two times a day (BID) | INTRAVENOUS | Status: DC
  Administered 2017-05-15 – 2017-05-25 (×13): 3 mL via INTRAVENOUS

## 2017-05-15 MED ORDER — OXYCODONE-ACETAMINOPHEN 5-325 MG PO TABS
1.0000 | ORAL_TABLET | Freq: Four times a day (QID) | ORAL | Status: DC | PRN
  Administered 2017-05-16 – 2017-05-26 (×24): 1 via ORAL
  Filled 2017-05-15 (×24): qty 1

## 2017-05-15 MED ORDER — LEVOTHYROXINE SODIUM 112 MCG PO TABS
224.0000 ug | ORAL_TABLET | Freq: Every day | ORAL | Status: DC
  Administered 2017-05-16 – 2017-05-26 (×11): 224 ug via ORAL
  Filled 2017-05-15 (×11): qty 2

## 2017-05-15 MED ORDER — FAMOTIDINE 20 MG PO TABS
20.0000 mg | ORAL_TABLET | Freq: Every day | ORAL | Status: DC
  Administered 2017-05-15 – 2017-05-25 (×11): 20 mg via ORAL
  Filled 2017-05-15 (×11): qty 1

## 2017-05-15 MED ORDER — FUROSEMIDE 10 MG/ML IJ SOLN
40.0000 mg | Freq: Two times a day (BID) | INTRAMUSCULAR | Status: DC
  Administered 2017-05-15 – 2017-05-17 (×5): 40 mg via INTRAVENOUS
  Filled 2017-05-15 (×5): qty 4

## 2017-05-15 MED ORDER — SODIUM CHLORIDE 0.9% FLUSH
3.0000 mL | INTRAVENOUS | Status: DC | PRN
  Administered 2017-05-17: 3 mL via INTRAVENOUS
  Filled 2017-05-15: qty 3

## 2017-05-15 MED ORDER — ONDANSETRON HCL 4 MG PO TABS
4.0000 mg | ORAL_TABLET | Freq: Three times a day (TID) | ORAL | Status: DC | PRN
  Administered 2017-05-17 – 2017-05-24 (×8): 4 mg via ORAL
  Filled 2017-05-15 (×10): qty 1

## 2017-05-15 MED ORDER — NYSTATIN 100000 UNIT/GM EX POWD
Freq: Two times a day (BID) | CUTANEOUS | Status: DC
  Administered 2017-05-15 – 2017-05-20 (×11): via TOPICAL
  Administered 2017-05-21: 1 via TOPICAL
  Administered 2017-05-21 – 2017-05-22 (×2): via TOPICAL
  Administered 2017-05-22: 1 via TOPICAL
  Administered 2017-05-23 – 2017-05-25 (×6): via TOPICAL
  Filled 2017-05-15 (×4): qty 15

## 2017-05-15 MED ORDER — DILTIAZEM HCL ER COATED BEADS 180 MG PO CP24
300.0000 mg | ORAL_CAPSULE | Freq: Every day | ORAL | Status: DC
  Administered 2017-05-16 – 2017-05-20 (×4): 300 mg via ORAL
  Filled 2017-05-15 (×4): qty 1

## 2017-05-15 MED ORDER — INSULIN ASPART 100 UNIT/ML ~~LOC~~ SOLN
0.0000 [IU] | Freq: Three times a day (TID) | SUBCUTANEOUS | Status: DC
  Administered 2017-05-16: 3 [IU] via SUBCUTANEOUS
  Administered 2017-05-16: 5 [IU] via SUBCUTANEOUS
  Administered 2017-05-17: 3 [IU] via SUBCUTANEOUS
  Administered 2017-05-17: 2 [IU] via SUBCUTANEOUS
  Administered 2017-05-18: 3 [IU] via SUBCUTANEOUS
  Administered 2017-05-18 – 2017-05-19 (×2): 2 [IU] via SUBCUTANEOUS
  Administered 2017-05-20: 4 [IU] via SUBCUTANEOUS
  Administered 2017-05-20: 1 [IU] via SUBCUTANEOUS
  Administered 2017-05-20 – 2017-05-21 (×3): 3 [IU] via SUBCUTANEOUS
  Administered 2017-05-21 – 2017-05-23 (×2): 2 [IU] via SUBCUTANEOUS
  Administered 2017-05-23: 3 [IU] via SUBCUTANEOUS
  Administered 2017-05-23: 1 [IU] via SUBCUTANEOUS
  Administered 2017-05-24: 2 [IU] via SUBCUTANEOUS
  Administered 2017-05-24: 5 [IU] via SUBCUTANEOUS
  Administered 2017-05-24: 1 [IU] via SUBCUTANEOUS
  Administered 2017-05-25: 2 [IU] via SUBCUTANEOUS
  Administered 2017-05-25: 3 [IU] via SUBCUTANEOUS
  Administered 2017-05-26: 2 [IU] via SUBCUTANEOUS
  Administered 2017-05-26: 3 [IU] via SUBCUTANEOUS

## 2017-05-15 MED ORDER — TOPIRAMATE 25 MG PO TABS
75.0000 mg | ORAL_TABLET | Freq: Every day | ORAL | Status: DC
  Administered 2017-05-15 – 2017-05-25 (×11): 75 mg via ORAL
  Filled 2017-05-15 (×11): qty 3

## 2017-05-15 MED ORDER — GABAPENTIN 300 MG PO CAPS
300.0000 mg | ORAL_CAPSULE | Freq: Three times a day (TID) | ORAL | Status: DC
  Administered 2017-05-15 – 2017-05-26 (×29): 300 mg via ORAL
  Filled 2017-05-15 (×29): qty 1

## 2017-05-15 MED ORDER — TIZANIDINE HCL 4 MG PO TABS
4.0000 mg | ORAL_TABLET | Freq: Three times a day (TID) | ORAL | Status: DC | PRN
  Administered 2017-05-20: 4 mg via ORAL
  Filled 2017-05-15 (×3): qty 1

## 2017-05-15 MED ORDER — INSULIN ASPART 100 UNIT/ML ~~LOC~~ SOLN
0.0000 [IU] | Freq: Every day | SUBCUTANEOUS | Status: DC
  Administered 2017-05-15: 3 [IU] via SUBCUTANEOUS
  Administered 2017-05-16: 4 [IU] via SUBCUTANEOUS
  Administered 2017-05-17: 2 [IU] via SUBCUTANEOUS
  Administered 2017-05-18 – 2017-05-21 (×3): 3 [IU] via SUBCUTANEOUS
  Administered 2017-05-21: 2 [IU] via SUBCUTANEOUS
  Administered 2017-05-23: 4 [IU] via SUBCUTANEOUS
  Administered 2017-05-24: 3 [IU] via SUBCUTANEOUS

## 2017-05-15 MED ORDER — POTASSIUM CHLORIDE CRYS ER 10 MEQ PO TBCR
20.0000 meq | EXTENDED_RELEASE_TABLET | Freq: Two times a day (BID) | ORAL | Status: DC
  Administered 2017-05-15 – 2017-05-17 (×5): 20 meq via ORAL
  Filled 2017-05-15 (×6): qty 2

## 2017-05-15 MED ORDER — OXYCODONE HCL 5 MG PO TABS
5.0000 mg | ORAL_TABLET | Freq: Four times a day (QID) | ORAL | Status: DC | PRN
  Administered 2017-05-16 – 2017-05-26 (×23): 5 mg via ORAL
  Filled 2017-05-15 (×23): qty 1

## 2017-05-15 MED ORDER — FERROUS SULFATE 325 (65 FE) MG PO TABS
325.0000 mg | ORAL_TABLET | Freq: Every day | ORAL | Status: DC
  Administered 2017-05-16 – 2017-05-26 (×9): 325 mg via ORAL
  Filled 2017-05-15 (×9): qty 1

## 2017-05-15 MED ORDER — LIRAGLUTIDE 18 MG/3ML ~~LOC~~ SOPN
1.2000 mg | PEN_INJECTOR | Freq: Every day | SUBCUTANEOUS | Status: DC
  Filled 2017-05-15: qty 3

## 2017-05-15 MED ORDER — ACETAMINOPHEN 325 MG PO TABS
650.0000 mg | ORAL_TABLET | Freq: Four times a day (QID) | ORAL | Status: DC | PRN
Start: 2017-05-15 — End: 2017-05-26

## 2017-05-15 MED ORDER — ACETAMINOPHEN 650 MG RE SUPP: 650.0000 mg | Freq: Four times a day (QID) | RECTAL | Status: DC | PRN

## 2017-05-15 MED ORDER — ALPRAZOLAM 0.5 MG PO TABS
1.0000 mg | ORAL_TABLET | Freq: Three times a day (TID) | ORAL | Status: DC | PRN
  Administered 2017-05-16 – 2017-05-26 (×24): 1 mg via ORAL
  Filled 2017-05-15 (×2): qty 2
  Filled 2017-05-15: qty 1
  Filled 2017-05-15 (×4): qty 2
  Filled 2017-05-15: qty 1
  Filled 2017-05-15: qty 2
  Filled 2017-05-15: qty 1
  Filled 2017-05-15 (×4): qty 2
  Filled 2017-05-15: qty 1
  Filled 2017-05-15 (×4): qty 2
  Filled 2017-05-15: qty 1
  Filled 2017-05-15: qty 2
  Filled 2017-05-15: qty 1
  Filled 2017-05-15: qty 2
  Filled 2017-05-15: qty 1
  Filled 2017-05-15: qty 2
  Filled 2017-05-15: qty 1

## 2017-05-15 MED ORDER — OXYCODONE-ACETAMINOPHEN 10-325 MG PO TABS: 1.0000 | ORAL_TABLET | Freq: Four times a day (QID) | ORAL | Status: DC | PRN

## 2017-05-15 MED ORDER — PRO-STAT SUGAR FREE PO LIQD
30.0000 mL | Freq: Two times a day (BID) | ORAL | Status: DC
  Administered 2017-05-15 – 2017-05-23 (×5): 30 mL via ORAL
  Filled 2017-05-15 (×14): qty 30

## 2017-05-15 MED ORDER — ASPIRIN EC 81 MG PO TBEC
81.0000 mg | DELAYED_RELEASE_TABLET | Freq: Every day | ORAL | Status: DC
  Administered 2017-05-16 – 2017-05-26 (×8): 81 mg via ORAL
  Filled 2017-05-15 (×9): qty 1

## 2017-05-15 NOTE — Progress Notes (Signed)
Patient arrived on unit via [facility].  No family at bedside.  Telemetry placed and CMT notified. Dr. Maudie Mercury notified of patient's arrival.

## 2017-05-15 NOTE — H&P (Addendum)
H&P   Patient Demographics:    Stephanie Combs, is a 67 y.o. female  MRN: [id number]   DOB - 1951-01-29  Admit Date - 05/15/2017  Outpatient Primary MD for the patient is [name], MD  Referring MD/NP/PA:   Outpatient Specialists:  Dr. Dorris Fetch (endocrine)  Patient coming from: home  No chief complaint on file.  dyspnea, chest pain   HPI:    Stephanie Combs  is a 67 y.o. female, w hypothyroidism, hypertension, CHF (EF 65%), Copd Dm2 w neuropathy apparently c/o increase in bilateral lower ext edema, + weight gain , dyspnea, orthopnea, as well as chest pain "burning" and squeezing and radiation to the left arm for the past 2 week. Intermittent. Last had chest pain last nite.  Pt requested direct admission for chest pain and dyspnea and lower ext edema.     Review of systems:    In addition to the HPI above,  No Fever-chills, No Headache, No changes with Vision or hearing, No problems swallowing food or Liquids, + dry cough No Abdominal pain, No Nausea or Vommitting, Bowel movements are regular, No Blood in stool or Urine, No dysuria, No new skin rashes or bruises, No new joints pains-aches,  No new weakness, tingling, numbness in any extremity, No recent weight gain or loss, No polyuria, polydypsia or polyphagia, No significant Mental Stressors.  A full 10 point Review of Systems was done, except as stated above, all other Review of Systems were negative.   With Past History of the following :    Past Medical History:  Diagnosis Date  . Anxiety   . CHF (congestive heart failure) (HCC)    Diastolic  . Chronic back pain   . COPD (chronic obstructive pulmonary disease) ([place])   . Degenerative disk disease   . Diabetes mellitus without complication ([place])   . Hypertension   . Hypothyroidism   . On home O2    2.5 L N/C prn  . Pedal edema       Past Surgical  History:  Procedure Laterality Date  . HERNIA REPAIR    . TUBAL LIGATION        Social History:     Social History   Tobacco Use  . Smoking status: Former Smoker    Packs/day: 0.25    Types: Cigarettes    Last attempt to quit: 06/27/2015    Years since quitting: 1.8  . Smokeless tobacco: Never Used  Substance Use Topics  . Alcohol use: No    Alcohol/week: 0.0 oz     Lives - at home by self  Mobility - unable to walk   Family History :     Family History  Problem Relation Age of Onset  . Stroke Mother   . Heart attack Father   . Diabetes Brother   . Cerebral palsy Brother   . Pneumonia Brother   .  Diabetes Other   . Heart attack Other       Home Medications:   Prior to Admission medications   Medication Sig Start Date End Date Taking? Authorizing Provider  albuterol (PROAIR HFA) 108 (90 BASE) MCG/ACT inhaler Inhale 1 puff into the lungs every 4 (four) hours as needed. For shortness of breath Patient taking differently: Inhale 2 puffs into the lungs every 4 (four) hours as needed. For shortness of breath 05/30/13   Kinnie Feil, MD  ALPRAZolam Duanne Moron) 1 MG tablet Take 1 mg by mouth 3 (three) times daily as needed for anxiety or sleep.  01/05/17   [provider]  Amino Acids-Protein Hydrolys (FEEDING SUPPLEMENT, PRO-STAT SUGAR FREE 64,) LIQD Take 30 mLs by mouth 2 (two) times daily. 02/05/17   [name], MD  aspirin EC 81 MG tablet Take 81 mg by mouth daily.    [provider]  Cholecalciferol (VITAMIN D3) 5000 units CAPS Take 1 capsule (5,000 Units total) by mouth daily. 08/19/16   Cassandria Anger, MD  diltiazem (CARDIZEM CD) 300 MG 24 hr capsule Take 300 mg by mouth daily.    [provider]  ferrous sulfate 325 (65 FE) MG tablet Take 1 tablet (325 mg total) by mouth daily with breakfast. 02/05/17   [name], MD  furosemide (LASIX) 20 MG tablet Take 1 tablet (20 mg total) by mouth 2 (two) times daily. 02/05/17   [name], MD   gabapentin (NEURONTIN) 300 MG capsule Take 1 capsule (300 mg total) by mouth 3 (three) times daily. 02/05/17   [name], MD  insulin detemir (LEVEMIR) 100 UNIT/ML injection INJECT 50 UNITS SUB-Q EVERY NIGHT AT BEDTIME. 05/13/17   Nida, Marella Chimes, MD  insulin lispro (HUMALOG) 100 UNIT/ML injection INJECT 10-16 UNITS 3 TIMES DAILY BEFORE MEALS 05/13/17   Cassandria Anger, MD  levothyroxine (SYNTHROID, LEVOTHROID) 112 MCG tablet Take 224 mcg by mouth daily before breakfast.  01/21/17   [provider]  lidocaine-prilocaine (EMLA) cream Apply 1 application topically as needed. Patient taking differently: Apply 1 application topically daily as needed.  10/10/16   Eber Jones, MD  nystatin (MYCOSTATIN/NYSTOP) powder Apply topically 2 (two) times daily. 10/10/16   Eber Jones, MD  Omega-3 Fatty Acids (FISH OIL PO) Take 1 capsule by mouth daily.     [provider]  ondansetron (ZOFRAN) 4 MG tablet Take 4 mg by mouth every 8 (eight) hours as needed for vomiting.  01/01/17   [provider]  oxyCODONE-acetaminophen (PERCOCET) 10-325 MG tablet Take 1 tablet by mouth every 6 (six) hours as needed for pain.    [provider]  pantoprazole (PROTONIX) 40 MG tablet Take 1 tablet (40 mg total) by mouth daily. 02/05/17   [name], MD  potassium chloride (K-DUR) 10 MEQ tablet Take 20 mEq by mouth 2 (two) times daily.     [provider]  ranitidine (ZANTAC) 150 MG tablet Take 450 mg by mouth 2 (two) times daily.     [provider]  silver sulfADIAZINE (SILVADENE) 1 % cream Apply 1 application topically daily.    [provider]  SURE COMFORT INS SYR 1CC/28G 28G X 1/2" 1 ML MISC USE TO INJECT INSULIN UP TO 4 TIMES DAILY. 03/06/17   Cassandria Anger, MD  tiZANidine (ZANAFLEX) 4 MG tablet Take 4 mg by mouth every 8 (eight) hours as needed for muscle spasms.  01/10/17   [provider]  topiramate (TOPAMAX) 25 MG  tablet  Take 75 mg by mouth daily.  01/02/17   [provider]  triamcinolone cream (KENALOG) 0.1 % Apply 1 application topically 4 (four) times daily.  01/24/17   [provider]  VICTOZA 18 MG/3ML SOPN Inject 1.2 mg into the skin daily.  01/01/17   [provider]     Allergies:     Allergies  Allergen Reactions  . Dilaudid [Hydromorphone Hcl] Itching  . Actifed Cold-Allergy [Chlorpheniramine-Phenylephrine]     "I was sick and red and it didn't agree with me at all"  . Doxycycline Nausea And Vomiting  . Other Cough    Pt states she is allergic to ragweed and that she starts coughing and sneezing like crazy  . Penicillins Hives and Itching    Tolerates Rocephin Has patient had a PCN reaction causing immediate rash, facial/tongue/throat swelling, SOB or lightheadedness with hypotension: Yes Has patient had a PCN reaction causing severe rash involving mucus membranes or skin necrosis: No Has patient had a PCN reaction that required hospitalization Yes Has patient had a PCN reaction occurring within the last 10 years: No If all of the above answers are "NO", then may proceed with Cephalosporin use.   . Reglan [Metoclopramide] Itching  . Valium [Diazepam] Itching  . Vistaril [Hydroxyzine Hcl] Itching     Physical Exam:   Vitals  There were no vitals taken for this visit.   1. General lying in bed in NAD,    2. Normal affect and insight, Not Suicidal or Homicidal, Awake Alert, Oriented X 3.  3. No F.N deficits, ALL C.Nerves Intact, Strength 5/5 all 4 extremities, Sensation intact all 4 extremities, Plantars down going.  4. Ears and Eyes appear Normal, Conjunctivae clear, PERRLA. Moist Oral Mucosa.  5. Supple Neck,  ? JVD, No cervical lymphadenopathy appriciated, No Carotid Bruits.  6. Symmetrical Chest wall movement, Good air movement bilaterally, slight crackles right lung base, slight bilateral exp wheezing  7. RRR, No Gallops, Rubs or Murmurs, No  Parasternal Heave.  8. Positive Bowel Sounds, Abdomen Soft, No tenderness, No organomegaly appriciated,No rebound -guarding or rigidity.  9.  No Cyanosis, 2+ edema bilateral LE, slight skin tears 80mmx 86mm on bilateral buttock  10. Good muscle tone,  joints appear normal , no effusions, Normal ROM.  11. No Palpable Lymph Nodes in Neck or Axillae    Data Review:    CBC No results for input(s): WBC, HGB, HCT, PLT, MCV, MCH, MCHC, RDW, LYMPHSABS, MONOABS, EOSABS, BASOSABS, BANDABS in the last 168 hours.  Invalid input(s): NEUTRABS, BANDSABD ------------------------------------------------------------------------------------------------------------------  Chemistries  No results for input(s): NA, K, CL, CO2, GLUCOSE, BUN, CREATININE, CALCIUM, MG, AST, ALT, ALKPHOS, BILITOT in the last 168 hours.  Invalid input(s): GFRCGP ------------------------------------------------------------------------------------------------------------------ CrCl cannot be calculated (Unknown ideal weight.). ------------------------------------------------------------------------------------------------------------------ No results for input(s): TSH, T4TOTAL, T3FREE, THYROIDAB in the last 72 hours.  Invalid input(s): FREET3  Coagulation profile No results for input(s): INR, PROTIME in the last 168 hours. ------------------------------------------------------------------------------------------------------------------- No results for input(s): DDIMER in the last 72 hours. -------------------------------------------------------------------------------------------------------------------  Cardiac Enzymes No results for input(s): CKMB, TROPONINI, MYOGLOBIN in the last 168 hours.  Invalid input(s): CK ------------------------------------------------------------------------------------------------------------------    Component Value Date/Time   BNP 108.0 (H) 05/01/2017 1000      ---------------------------------------------------------------------------------------------------------------  Urinalysis    Component Value Date/Time   COLORURINE YELLOW 12/01/2016 1628   APPEARANCEUR CLEAR 12/01/2016 1628   LABSPEC 1.013 12/01/2016 1628   PHURINE 6.0 12/01/2016 1628   GLUCOSEU NEGATIVE 12/01/2016 1628  HGBUR NEGATIVE 12/01/2016 1628   BILIRUBINUR NEGATIVE 12/01/2016 1628   KETONESUR NEGATIVE 12/01/2016 1628   PROTEINUR 30 (A) 12/01/2016 1628   UROBILINOGEN 0.2 12/04/2014 0650   NITRITE NEGATIVE 12/01/2016 1628   LEUKOCYTESUR NEGATIVE 12/01/2016 1628    ----------------------------------------------------------------------------------------------------------------   Imaging Results:    No results found.    Assessment & Plan:    Active Problems:   DM type 2 (diabetes mellitus, type 2) (HCC)   Dyspnea   Edema   Cp Tele Trop I q6h x3 12 lead ekg Cardiac echo  Dyspnea  cxr Check echo  LE edema Lasix 40mg  iv bid  Dm2 fsbs ac and qhs, ISS Cont lantus  Hypothyroidism Check tsh  Abdominal wound, skin tears on buttock Wound care consult   DVT ProphylaxisLovenox - SCDs  AM Labs Ordered, also please review Full Orders  Family Communication: Admission, patients condition and plan of care including tests being ordered have been discussed with the patient who indicate understanding and agree with the plan and Code Status.  Code Status FULL CODE  Likely DC to  home  Condition GUARDED   Consults called: none  Admission status: inpatient   Time spent in minutes : 45    [name] M.D on 05/15/2017 at 6:24 PM  Between 7am to 7am  - Pager - [phone]

## 2017-05-15 NOTE — Progress Notes (Signed)
Patient has wounds on sacrum which she states is d/t a yeast infection.  Patient has an abdominal wound which she states she does not want the dressing removed because it was just packed today. Patient states she has a Therapist, sports that comes to her home and dresses her wounds. Bilateral lower extremities are wrapped with compression wraps.  Remove wraps and leave open to air per Dr. Maudie Mercury.  WOC consult placed by MD.

## 2017-05-16 ENCOUNTER — Ambulatory Visit (HOSPITAL_COMMUNITY)
Admit: 2017-05-16 | Discharge: 2017-05-16 | Disposition: A | Discharge status: Home / Self Care | Payer: Medicare Other | Attending: Internal Medicine | Admitting: Internal Medicine

## 2017-05-16 ENCOUNTER — Inpatient Hospital Stay (HOSPITAL_COMMUNITY): Payer: Medicare Other

## 2017-05-16 LAB — PROTIME-INR
INR: 0.99
Prothrombin Time: 13 seconds (ref 11.4–15.2)

## 2017-05-16 LAB — GLUCOSE, CAPILLARY
Glucose-Capillary: 141 mg/dL — ABNORMAL HIGH (ref 65–99)
Glucose-Capillary: 159 mg/dL — ABNORMAL HIGH (ref 65–99)
Glucose-Capillary: 269 mg/dL — ABNORMAL HIGH (ref 65–99)
Glucose-Capillary: 278 mg/dL — ABNORMAL HIGH (ref 65–99)

## 2017-05-16 LAB — TROPONIN I
Troponin I: <0.03 ng/mL (ref <0.03)
Troponin I: <0.03 ng/mL (ref <0.03)

## 2017-05-16 LAB — ECHOCARDIOGRAM COMPLETE: Weight: 4335.13 oz

## 2017-05-16 LAB — APTT: aPTT: 28 seconds (ref 24–36)

## 2017-05-16 LAB — T4, FREE: Free T4: 1.65 ng/dL — ABNORMAL HIGH (ref 0.61–1.12)

## 2017-05-16 MED ORDER — TECHNETIUM TC 99M TETROFOSMIN IV KIT
30.0000 | PACK | Freq: Once | INTRAVENOUS | Status: AC | PRN
  Administered 2017-05-16: 30 via INTRAVENOUS

## 2017-05-16 MED ORDER — ALBUTEROL SULFATE (2.5 MG/3ML) 0.083% IN NEBU
2.5000 mg | INHALATION_SOLUTION | Freq: Three times a day (TID) | RESPIRATORY_TRACT | Status: DC
  Administered 2017-05-16 – 2017-05-24 (×17): 2.5 mg via RESPIRATORY_TRACT
  Filled 2017-05-16 (×24): qty 3

## 2017-05-16 MED ORDER — UMECLIDINIUM-VILANTEROL 62.5-25 MCG/INH IN AEPB
1.0000 | INHALATION_SPRAY | Freq: Every day | RESPIRATORY_TRACT | Status: DC
  Administered 2017-05-16 – 2017-05-26 (×6): 1 via RESPIRATORY_TRACT
  Filled 2017-05-16 (×2): qty 14

## 2017-05-16 NOTE — Plan of Care (Signed)
  Safety: Ability to remain free from injury will improve 05/16/2017 0245 - Progressing by Mickie Kay, RN

## 2017-05-16 NOTE — Progress Notes (Addendum)
Patient ID: Stephanie Combs, female   DOB: [birth year], 67 y.o.   MRN: [id number]                                                                PROGRESS NOTE                                                                                                                                                                                                             Patient Demographics:    Stephanie Combs, is a 67 y.o. female, DOB - 31-Mar-1951, NTZ:[id number]  Admit date - 05/15/2017   Admitting Physician [name], MD  Outpatient Primary MD for the patient is [name], MD  LOS - 1  Outpatient Specialists: [facility] cardiology Carlyle Dolly  No chief complaint on file.      Brief Narrative  67 y.o. female, w hypothyroidism, hypertension, CHF (EF 65%), Copd Dm2 w neuropathy apparently c/o increase in bilateral lower ext edema, + weight gain , dyspnea, orthopnea, as well as chest pain "burning" and squeezing and radiation to the left arm for the past 2 week. Intermittent. Last had chest pain last nite.  Pt requested direct admission for chest pain and dyspnea and lower ext edema.      Subjective:    [name] today is not currently having chest pain, typically she says she has at nite.  Still with some dyspnea, lower ext edema slowly improving with lasix.  Still has abdominal wound.   No headache,  No abdominal pain - No Nausea, No new weakness tingling or numbness, No Cough -   Assessment  & Plan :    Active Problems:   DM type 2 (diabetes mellitus, type 2) (HCC)   Dyspnea   Edema   Chest pain Tele Cardiac echo to assess EF Nuclear stress test ordered for tomorrow AM NPO after midnite  Dyspnea/ wheezing likely mild Copd exacerbation, if not improving will start steroids Start on Anoro Start albuterol neb tid Check echo  LE edema (failed outpatient diuresis) Lasix 40mg  iv bid  Dm2/neuropathy fsbs ac and qhs iss  Code Status : DNR  Family Communication  : w  patient  Disposition Plan  : home  Barriers For Discharge :   Consults  :  none  Procedures  :  DVT Prophylaxis  :  Lovenox  SCDs   Lab Results  Component Value Date   PLT 333 05/15/2017    Antibiotics  :    Anti-infectives (From admission, onward)   None        Objective:   Vitals:   05/15/17 1943 05/15/17 2030 05/16/17 0457  BP:  (!) 152/62 137/63  Pulse:  71 61  Resp:  18 19  Temp:  98.2 F (36.8 C) 98.5 F (36.9 C)  TempSrc:  Oral Oral  SpO2:  100% 100%  Weight: 130.3 kg (287 lb 4.2 oz)  122.9 kg (270 lb 15.1 oz)    Wt Readings from Last 3 Encounters:  05/16/17 122.9 kg (270 lb 15.1 oz)  02/02/17 133.4 kg (294 lb 1.5 oz)  01/26/17 (!) 141.1 kg (311 lb)     Intake/Output Summary (Last 24 hours) at 05/16/2017 0735 Last data filed at 05/16/2017 0259 Gross per 24 hour  Intake -  Output 2500 ml  Net -2500 ml     Physical Exam  Awake Alert, Oriented X 3, No new F.N deficits, Normal affect [place].AT,PERRAL Supple Neck,No JVD, No cervical lymphadenopathy appriciated.  Symmetrical Chest wall movement, Good air movement bilaterally, bilateral exp wheezing, no crackle.  RRR,No Gallops,Rubs or new Murmurs, No Parasternal Heave +ve B.Sounds, Abd Soft, No tenderness, No organomegaly appriciated, No rebound - guarding or rigidity. No Cyanosis, 2+ edema Unable to walk    Data Review:    CBC Recent Labs  Lab 05/15/17 1837  WBC 7.2  HGB 11.4*  HCT 38.0  PLT 333  MCV 85.2  MCH 25.6*  MCHC 30.0  RDW 15.9*    Chemistries  Recent Labs  Lab 05/15/17 1837  NA 137  K 4.0  CL 101  CO2 29  GLUCOSE 179*  BUN 29*  CREATININE 1.21*  CALCIUM 9.0  AST 16  ALT 11*  ALKPHOS 82  BILITOT 0.5   ------------------------------------------------------------------------------------------------------------------ No results for input(s): CHOL, HDL, LDLCALC, TRIG, CHOLHDL, LDLDIRECT in the last 72 hours.  Lab Results  Component Value Date   HGBA1C 7.7 (H)  05/15/2017   ------------------------------------------------------------------------------------------------------------------ Recent Labs    05/15/17 1837  TSH 0.320*   ------------------------------------------------------------------------------------------------------------------ No results for input(s): VITAMINB12, FOLATE, FERRITIN, TIBC, IRON, RETICCTPCT in the last 72 hours.  Coagulation profile Recent Labs  Lab 05/16/17 0517  INR 0.99    No results for input(s): DDIMER in the last 72 hours.  Cardiac Enzymes Recent Labs  Lab 05/15/17 1837 05/16/17 0011 05/16/17 0517  TROPONINI <0.03 <0.03 <0.03   ------------------------------------------------------------------------------------------------------------------    Component Value Date/Time   BNP 108.0 (H) 05/01/2017 1000    Inpatient Medications  Scheduled Meds: . aspirin EC  81 mg Oral Daily  . cholecalciferol  5,000 Units Oral Daily  . diltiazem  300 mg Oral Daily  . enoxaparin (LOVENOX) injection  0.5 mg/kg Subcutaneous Q24H  . famotidine  20 mg Oral QHS  . feeding supplement (PRO-STAT SUGAR FREE 64)  30 mL Oral BID  . ferrous sulfate  325 mg Oral Q breakfast  . furosemide  40 mg Intravenous BID  . gabapentin  300 mg Oral TID  . insulin aspart  0-5 Units Subcutaneous QHS  . insulin aspart  0-9 Units Subcutaneous TID WC  . insulin detemir  50 Units Subcutaneous QHS  . levothyroxine  224 mcg Oral QAC breakfast  . liraglutide  1.2 mg Subcutaneous Daily  . nystatin   Topical BID  . pantoprazole  40 mg Oral  Daily  . potassium chloride  20 mEq Oral BID  . sodium chloride flush  3 mL Intravenous Q12H  . topiramate  75 mg Oral Daily   Continuous Infusions: . sodium chloride     PRN Meds:.sodium chloride, acetaminophen **OR** acetaminophen, albuterol, ALPRAZolam, ondansetron, oxyCODONE-acetaminophen **AND** oxyCODONE, sodium chloride flush, tiZANidine  Micro Results No results found for this or any  previous visit (from the past 240 hour(s)).  Radiology Reports Dg Chest Portable 1 View  Result Date: 05/16/2017 CLINICAL DATA:  Chest pain EXAM: PORTABLE CHEST 1 VIEW COMPARISON:  01/30/2017 FINDINGS: Patient rotation limits evaluation. Heart size and pulmonary vascularity are normal. Linear atelectasis in the lung bases, slightly more pronounced than on previous study. No developing consolidation or airspace disease. No pneumothorax. IMPRESSION: Shallow inspiration with linear atelectasis in the lung bases. Electronically Signed   By: Lucienne Capers M.D.   On: 05/16/2017 00:59    Time Spent in minutes  30   [name] M.D on 05/16/2017 at 7:35 AM  Between 7am to 7am - Pager - [phone]

## 2017-05-16 NOTE — Plan of Care (Signed)
  Pain Managment: General experience of comfort will improve 05/16/2017 2323 - Progressing by Mickie Kay, RN

## 2017-05-16 NOTE — Progress Notes (Signed)
  Echocardiogram 2D Echocardiogram has been performed.  Merrie Roof F 05/16/2017, 4:55 PM

## 2017-05-17 DIAGNOSIS — R079 Chest pain, unspecified: Secondary | ICD-10-CM

## 2017-05-17 LAB — COMPREHENSIVE METABOLIC PANEL
ALT: 8 U/L — ABNORMAL LOW (ref 14–54)
AST: 11 U/L — ABNORMAL LOW (ref 15–41)
Albumin: 3 g/dL — ABNORMAL LOW (ref 3.5–5.0)
Alkaline Phosphatase: 69 U/L (ref 38–126)
Anion gap: 8 (ref 5–15)
BUN: 28 mg/dL — ABNORMAL HIGH (ref 6–20)
CO2: 28 mmol/L (ref 22–32)
Calcium: 8.7 mg/dL — ABNORMAL LOW (ref 8.9–10.3)
Chloride: 101 mmol/L (ref 101–111)
Creatinine, Ser: 1.44 mg/dL — ABNORMAL HIGH (ref 0.44–1.00)
GFR calc Af Amer: 42 mL/min — ABNORMAL LOW (ref >60)
GFR calc non Af Amer: 37 mL/min — ABNORMAL LOW (ref >60)
Glucose, Bld: 210 mg/dL — ABNORMAL HIGH (ref 65–99)
Potassium: 4.2 mmol/L (ref 3.5–5.1)
Sodium: 137 mmol/L (ref 135–145)
Total Bilirubin: 0.5 mg/dL (ref 0.3–1.2)
Total Protein: 5.9 g/dL — ABNORMAL LOW (ref 6.5–8.1)

## 2017-05-17 LAB — CBC
HCT: 34.6 % — ABNORMAL LOW (ref 36.0–46.0)
Hemoglobin: 10.5 g/dL — ABNORMAL LOW (ref 12.0–15.0)
MCH: 26.1 pg (ref 26.0–34.0)
MCHC: 30.3 g/dL (ref 30.0–36.0)
MCV: 85.9 fL (ref 78.0–100.0)
Platelets: 272 10*3/uL (ref 150–400)
RBC: 4.03 MIL/uL (ref 3.87–5.11)
RDW: 15.9 % — ABNORMAL HIGH (ref 11.5–15.5)
WBC: 6.5 10*3/uL (ref 4.0–10.5)

## 2017-05-17 LAB — GLUCOSE, CAPILLARY
Glucose-Capillary: 307 mg/dL — ABNORMAL HIGH (ref 65–99)
Glucose-Capillary: 176 mg/dL — ABNORMAL HIGH (ref 65–99)
Glucose-Capillary: 176 mg/dL — ABNORMAL HIGH (ref 65–99)
Glucose-Capillary: 233 mg/dL — ABNORMAL HIGH (ref 65–99)
Glucose-Capillary: 247 mg/dL — ABNORMAL HIGH (ref 65–99)

## 2017-05-17 LAB — NM MYOCAR MULTI W/SPECT W/WALL MOTION / EF
Estimated workload: 1 METS
Exercise duration (min): 0 min
Exercise duration (sec): 0 s
MPHR: 153 {beats}/min
Peak HR: 96 {beats}/min
Percent HR: 62 %
Rest HR: 57 {beats}/min

## 2017-05-17 LAB — URINE CULTURE: Culture: NO GROWTH

## 2017-05-17 MED ORDER — REGADENOSON 0.4 MG/5ML IV SOLN
INTRAVENOUS | Status: AC
  Filled 2017-05-17: qty 5

## 2017-05-17 MED ORDER — TECHNETIUM TC 99M TETROFOSMIN IV KIT
30.0000 | PACK | Freq: Once | INTRAVENOUS | Status: AC | PRN
  Administered 2017-05-17: 30 via INTRAVENOUS

## 2017-05-17 MED ORDER — REGADENOSON 0.4 MG/5ML IV SOLN
0.4000 mg | Freq: Once | INTRAVENOUS | Status: AC
  Administered 2017-05-17: 0.4 mg via INTRAVENOUS

## 2017-05-17 NOTE — Progress Notes (Signed)
Abnormal nuclear stress, moderate ischemia in apex. If clinically indicated consider full cardiology consultation, this will need to be called in. I have made patient npo at midnight in case further testing is neccesary.    Carlyle Dolly MD

## 2017-05-17 NOTE — Progress Notes (Signed)
   [name] presented for a lexiscan cardiolite today.  No immediate complications.  Stress imaging is pending at this time.  Murray Hodgkins, NP 05/17/2017, 9:36 AM

## 2017-05-17 NOTE — Progress Notes (Signed)
Patient ID: Stephanie Combs, female   DOB: [birth year], 67 y.o.   MRN: [id number]                                                                PROGRESS NOTE                                                                                                                                                                                                             Patient Demographics:    Stephanie Combs, is a 67 y.o. female, DOB - [birth year], [id number]:[id number]  Admit date - 05/15/2017   Admitting Physician [name], MD  Outpatient Primary MD for the patient is [name], MD  LOS - 2  Outpatient Specialists:   [facility] cardiology Carlyle Dolly    No chief complaint on file.      Brief Narrative 67 y.o.female,w hypothyroidism, hypertension, CHF (EF 65%), Copd Dm2 w neuropathy apparently c/o increase in bilateral lower ext edema, + weight gain , dyspnea, orthopnea, as well as chest pain "burning" and squeezing and radiation to the left arm for the past 2 week. Intermittent. Last had chest pain last nite. Pt requested direct admission for chest pain and dyspnea and lower ext edema.      Subjective:    Stephanie Combs today notes slight chest discomfort last nite.  She is awaiting her nuclear stress test.  Lower ext edema slowly improving with lasix.  Her breathing is feeling slightly better since -3L. As well as starting Anoro yesterday.   Still with abdominal wound, for which she is receiving wound care.    No headache,  No abdominal pain - No Nausea, No new weakness tingling or numbness, No Cough -   Assessment  & Plan :    Active Problems:   DM type 2 (diabetes mellitus, type 2) (HCC)   Dyspnea   Edema     Chest pain Tele Cardiac echo to assess EF NPO currently , while awaiting nuclear stress test  Dyspnea/ wheezing likely mild Copd exacerbation Cont Anoro Cont albuterol neb tid Check echo  LE edema (failed outpatient diuresis) Lasix 40mg  iv bid cmp in  am  Dm2/neuropathy fsbs ac and qhs iss  Hypothyroidism Cont levothyroxine  Anemia Check cbc in am  Abdominal wound, 3x2cm pink granulation tissue  Continue wound care  Code Status : DNR  Family Communication  : w patient  Disposition Plan  : home  Barriers For Discharge :   Consults  :  none  Procedures  :   DVT Prophylaxis  :  Lovenox  SCDs       Lab Results  Component Value Date   PLT 272 05/17/2017      Anti-infectives (From admission, onward)   None        Objective:   Vitals:   05/16/17 1500 05/16/17 1531 05/16/17 2124 05/17/17 0649  BP: (!) 96/36  (!) 136/58 124/62  Pulse: 65  70 (!) 56  Resp: 18  16 18   Temp: (!) 97.2 F (36.2 C)  98.1 F (36.7 C) 98.5 F (36.9 C)  TempSrc: Oral  Oral Oral  SpO2: 100% 97% 100% 100%  Weight:    126 kg (277 lb 12.5 oz)    Wt Readings from Last 3 Encounters:  05/17/17 126 kg (277 lb 12.5 oz)  02/02/17 133.4 kg (294 lb 1.5 oz)  01/26/17 (!) 141.1 kg (311 lb)     Intake/Output Summary (Last 24 hours) at 05/17/2017 0725 Last data filed at 05/17/2017 0649 Gross per 24 hour  Intake 360 ml  Output 1750 ml  Net -1390 ml     Physical Exam  Awake Alert, Oriented X 3, No new F.N deficits, Normal affect [place].AT,PERRAL Supple Neck,No JVD, No cervical lymphadenopathy appriciated.  Symmetrical Chest wall movement, Good air movement bilaterally, CTAB RRR,No Gallops,Rubs or new Murmurs, No Parasternal Heave +ve B.Sounds, Abd Soft, No tenderness, No organomegaly appriciated, No rebound - guarding or rigidity. No Cyanosis, Clubbing , has  2+ edema  2x3cm oval wound mid lower abdomen.    Data Review:    CBC Recent Labs  Lab 05/15/17 1837 05/17/17 0602  WBC 7.2 6.5  HGB 11.4* 10.5*  HCT 38.0 34.6*  PLT 333 272  MCV 85.2 85.9  MCH 25.6* 26.1  MCHC 30.0 30.3  RDW 15.9* 15.9*    Chemistries  Recent Labs  Lab 05/15/17 1837  NA 137  K 4.0  CL 101  CO2 29  GLUCOSE 179*  BUN 29*   CREATININE 1.21*  CALCIUM 9.0  AST 16  ALT 11*  ALKPHOS 82  BILITOT 0.5   ------------------------------------------------------------------------------------------------------------------ No results for input(s): CHOL, HDL, LDLCALC, TRIG, CHOLHDL, LDLDIRECT in the last 72 hours.  Lab Results  Component Value Date   HGBA1C 7.7 (H) 05/15/2017   ------------------------------------------------------------------------------------------------------------------ Recent Labs    05/15/17 1837  TSH 0.320*   ------------------------------------------------------------------------------------------------------------------ No results for input(s): VITAMINB12, FOLATE, FERRITIN, TIBC, IRON, RETICCTPCT in the last 72 hours.  Coagulation profile Recent Labs  Lab 05/16/17 0517  INR 0.99    No results for input(s): DDIMER in the last 72 hours.  Cardiac Enzymes Recent Labs  Lab 05/15/17 1837 05/16/17 0011 05/16/17 0517  TROPONINI <0.03 <0.03 <0.03   ------------------------------------------------------------------------------------------------------------------    Component Value Date/Time   BNP 108.0 (H) 05/01/2017 1000    Inpatient Medications  Scheduled Meds: . albuterol  2.5 mg Nebulization TID  . aspirin EC  81 mg Oral Daily  . cholecalciferol  5,000 Units Oral Daily  . diltiazem  300 mg Oral Daily  . enoxaparin (LOVENOX) injection  0.5 mg/kg Subcutaneous Q24H  . famotidine  20 mg Oral QHS  . feeding supplement (PRO-STAT SUGAR FREE 64)  30 mL Oral BID  . ferrous sulfate  325 mg Oral Q breakfast  . furosemide  40 mg Intravenous BID  . gabapentin  300 mg Oral TID  . insulin aspart  0-5 Units Subcutaneous QHS  . insulin aspart  0-9 Units Subcutaneous TID WC  . insulin detemir  50 Units Subcutaneous QHS  . levothyroxine  224 mcg Oral QAC breakfast  . liraglutide  1.2 mg Subcutaneous Daily  . nystatin   Topical BID  . pantoprazole  40 mg Oral Daily  . potassium  chloride  20 mEq Oral BID  . sodium chloride flush  3 mL Intravenous Q12H  . topiramate  75 mg Oral Daily  . umeclidinium-vilanterol  1 puff Inhalation Daily   Continuous Infusions: . sodium chloride     PRN Meds:.sodium chloride, acetaminophen **OR** acetaminophen, albuterol, ALPRAZolam, ondansetron, oxyCODONE-acetaminophen **AND** oxyCODONE, sodium chloride flush, tiZANidine  Micro Results No results found for this or any previous visit (from the past 240 hour(s)).  Radiology Reports Dg Chest Portable 1 View  Result Date: 05/16/2017 CLINICAL DATA:  Chest pain EXAM: PORTABLE CHEST 1 VIEW COMPARISON:  01/30/2017 FINDINGS: Patient rotation limits evaluation. Heart size and pulmonary vascularity are normal. Linear atelectasis in the lung bases, slightly more pronounced than on previous study. No developing consolidation or airspace disease. No pneumothorax. IMPRESSION: Shallow inspiration with linear atelectasis in the lung bases. Electronically Signed   By: Lucienne Capers M.D.   On: 05/16/2017 00:59    Time Spent in minutes  30   [name] M.D on 05/17/2017 at 7:25 AM  Between 7am to 7am- Pager - [phone]

## 2017-05-18 DIAGNOSIS — R072 Precordial pain: Secondary | ICD-10-CM

## 2017-05-18 LAB — CBC
HCT: 39.1 % (ref 36.0–46.0)
Hemoglobin: 11.8 g/dL — ABNORMAL LOW (ref 12.0–15.0)
MCH: 26.2 pg (ref 26.0–34.0)
MCHC: 30.2 g/dL (ref 30.0–36.0)
MCV: 86.7 fL (ref 78.0–100.0)
Platelets: 325 10*3/uL (ref 150–400)
RBC: 4.51 MIL/uL (ref 3.87–5.11)
RDW: 15.8 % — ABNORMAL HIGH (ref 11.5–15.5)
WBC: 6.7 10*3/uL (ref 4.0–10.5)

## 2017-05-18 LAB — COMPREHENSIVE METABOLIC PANEL
ALT: 10 U/L — ABNORMAL LOW (ref 14–54)
AST: 15 U/L (ref 15–41)
Albumin: 3.7 g/dL (ref 3.5–5.0)
Alkaline Phosphatase: 82 U/L (ref 38–126)
Anion gap: 9 (ref 5–15)
BUN: 30 mg/dL — ABNORMAL HIGH (ref 6–20)
CO2: 29 mmol/L (ref 22–32)
Calcium: 9.2 mg/dL (ref 8.9–10.3)
Chloride: 97 mmol/L — ABNORMAL LOW (ref 101–111)
Creatinine, Ser: 1.66 mg/dL — ABNORMAL HIGH (ref 0.44–1.00)
GFR calc Af Amer: 36 mL/min — ABNORMAL LOW (ref >60)
GFR calc non Af Amer: 31 mL/min — ABNORMAL LOW (ref >60)
Glucose, Bld: 199 mg/dL — ABNORMAL HIGH (ref 65–99)
Potassium: 4.5 mmol/L (ref 3.5–5.1)
Sodium: 135 mmol/L (ref 135–145)
Total Bilirubin: 0.5 mg/dL (ref 0.3–1.2)
Total Protein: 7.2 g/dL (ref 6.5–8.1)

## 2017-05-18 LAB — GLUCOSE, CAPILLARY
Glucose-Capillary: 180 mg/dL — ABNORMAL HIGH (ref 65–99)
Glucose-Capillary: 106 mg/dL — ABNORMAL HIGH (ref 65–99)
Glucose-Capillary: 207 mg/dL — ABNORMAL HIGH (ref 65–99)
Glucose-Capillary: 274 mg/dL — ABNORMAL HIGH (ref 65–99)

## 2017-05-18 MED ORDER — GERHARDT'S BUTT CREAM
TOPICAL_CREAM | Freq: Three times a day (TID) | CUTANEOUS | Status: DC
  Administered 2017-05-18 – 2017-05-19 (×2): via TOPICAL
  Administered 2017-05-19: 1 via TOPICAL
  Administered 2017-05-20 (×3): via TOPICAL
  Administered 2017-05-21: 1 via TOPICAL
  Administered 2017-05-21 – 2017-05-22 (×3): via TOPICAL
  Administered 2017-05-22: 1 via TOPICAL
  Administered 2017-05-23 – 2017-05-25 (×8): via TOPICAL
  Filled 2017-05-18 (×6): qty 1

## 2017-05-18 MED ORDER — SODIUM CHLORIDE 0.9 % IV SOLN
INTRAVENOUS | Status: AC
Start: 2017-05-18 — End: 2017-05-18
  Administered 2017-05-18: 09:00:00 via INTRAVENOUS

## 2017-05-18 MED ORDER — ATORVASTATIN CALCIUM 80 MG PO TABS
80.0000 mg | ORAL_TABLET | Freq: Every day | ORAL | Status: DC
  Administered 2017-05-18 – 2017-05-25 (×7): 80 mg via ORAL
  Filled 2017-05-18 (×5): qty 1
  Filled 2017-05-18: qty 2
  Filled 2017-05-18: qty 1

## 2017-05-18 NOTE — Progress Notes (Signed)
Pt refused low air loss mattress; this RN explained the benefits of the low air loss mattress and risks of not using on. Pt states she hated it last time and can not move in the bed and does not want it.

## 2017-05-18 NOTE — Care Management Note (Signed)
Case Management Note  Patient Details  Name: Stephanie Combs MRN: [id number] Date of Birth: 04-28-1951  Subjective/Objective:                  Chest apin, dyspnea and bilateral lower ext. edema  Action/Plan: Date:  May 18, 2017 Chart reviewed for concurrent status and case management needs.  Will continue to follow patient progress.  Discharge Planning: following for needs  Expected discharge date: May 21 2017 Velva Harman, BSN, [place], [place]   Expected Discharge Date:  (unknown)               Expected Discharge Plan:  [place]  In-House Referral:     Discharge planning Services  CM Consult  Post Acute Care Choice:  Resumption of Svcs/PTA Provider Choice offered to:     DME Arranged:    DME Agency:     HH Arranged:  Nurse's Aide [place] Agency:  Other - See comment  Status of Service:  In process, will continue to follow  If discussed at Long Length of Stay Meetings, dates discussed:    Additional Comments:  Leeroy Cha, RN 05/18/2017, 10:30 AM

## 2017-05-18 NOTE — Consult Note (Signed)
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 Patient known to me from a previous admission. Wound type: Moisture Pressure Injury POA: NA Measurement: Central subpannicular wound (full thickness) is much improved over last admission and currently measures 2.2cm x 1.5cm x 0.2cm.  Red, moist wound bed with maceration in the periwound area.  Dressing is due to be changed. 5 circular lesions in the lower buttock, perineal area: pink wound bed, scant serous drainage. The largest lesion measures .8cm round x 0.1cm deep. Intertriginous dermatitis on either side of the subpannicular skin fold with musty odor and maceration, but no break in skin. Wound bed:As described above Drainage (amount, consistency, odor) As described above Periwound:As described above Dressing procedure/placement/frequency: I will place patient on a mattress replacement with low air loss feature to manage moisture and redistribute pressure. Additionally, topical care to the buttock lesions exacerbated by urinary incontinence despite the external female powered wicking device (PurWick) is provided using three times daily application of a compounded prescriptive product: Gerhart's Butt Cream (1:1:1 hydrocortisone, lotrimin and zinc oxide). Intertriginous dermatitis will be managed and prevented using our house antimicrobial textile, InterDry Ag+. Finally, the central subpannicular wound is much improved over her last admission and is a credit to the [name] with her care at home. I will continue topical wound care to this area with daily dressing changes using Aquacel Ag+.  (It is understood that dressing changes were reduced to three times weekly at home). Turning and repositioning remains an important component of this patients POC. Recommendations discussed with patient and with bedside RN. [place] nursing team will not  follow, but will remain available to this patient, the nursing and medical teams.  Please re-consult if needed. Thanks, Maudie Flakes, MSN, RN, [place], Arther Abbott  Pager# [phone]

## 2017-05-18 NOTE — Plan of Care (Signed)
  Safety: Ability to remain free from injury will improve 05/18/2017 0046 - Progressing by Mickie Kay, RN

## 2017-05-18 NOTE — Consult Note (Signed)
Cardiology Consultation:   Patient ID: Stephanie Combs; [id number]; Jan 27, 1951   Admit date: 05/15/2017 Date of Consult: 05/18/2017  Primary Care Provider: [name], MD Primary Cardiologist: Carlyle Dolly, MD   Patient Profile:   Stephanie Combs is a 67 y.o. female with a PMH of diastolic HF (EF 56%), HTN, HLD, COPD, DM type 2, hypothyroidism, depression, anxiety, and neuropathy who is being seen today for the evaluation of CP with an abnormal stress test at the request of Dr. Maudie Mercury.  History of Present Illness:   Stephanie Combs is a 67yo F with PMH of diastolic HF (EF 38%), HTN, HLD, COPD, DM type 2, hypothyroidism, depression, anxiety, and neuropathy, who presented to [facility] ED 05/15/17 with complaints of chest pain and volume overload symptoms, worsening over the past 2 weeks. CP has been an intermittent burning/squeezing pain which radiates to the L arm. States pain occurs at both rest and with activity, typically lasts about 30 minutes, and is relieved with taking ASA and nitroglycerine. She notes associated dizziness, SOB, and nausea when chest pain occurs. States she has been experiencing a significant amount of personal stress, particularly around the holidays. She has never had a cardiac cath.   Patient states she has also been having increase weight gain, b/l LE swelling, dyspnea, and orthopnea. States she was last admitted to [facility] 01/2017 for CHF exacerbation and diuresed with IV lasix. She was discharged home on po lasix 20mg  BID.   Last seen by Cardiology outpatient 08/2015, thought to be doing well from a cardiac standpoint. No changes were made to her CHF or HTN regimen.   Hospital course: Admitted to medicine for CHF exacerbation and evaluation of chest pain. VSS - afebrile, BP stable. EKG non-ischemic, troponin negative x3. Echo with no significant change - stable EF, no wall motion abnormalities. Stress test concerning for moderate ischemia in the apex. She has been diuresed with IV  lasix 40mg  BID with good UOP.  (-5L).  Past Medical History:  Diagnosis Date  . Anxiety   . CHF (congestive heart failure) (HCC)    Diastolic  . Chronic back pain   . COPD (chronic obstructive pulmonary disease) ([place])   . Degenerative disk disease   . Diabetes mellitus without complication ([place])   . Hypertension   . Hypothyroidism   . On home O2    2.5 L N/C prn  . Pedal edema     Past Surgical History:  Procedure Laterality Date  . HERNIA REPAIR    . TUBAL LIGATION         Inpatient Medications: Scheduled Meds: . albuterol  2.5 mg Nebulization TID  . aspirin EC  81 mg Oral Daily  . atorvastatin  80 mg Oral q1800  . cholecalciferol  5,000 Units Oral Daily  . diltiazem  300 mg Oral Daily  . enoxaparin (LOVENOX) injection  0.5 mg/kg Subcutaneous Q24H  . famotidine  20 mg Oral QHS  . feeding supplement (PRO-STAT SUGAR FREE 64)  30 mL Oral BID  . ferrous sulfate  325 mg Oral Q breakfast  . gabapentin  300 mg Oral TID  . insulin aspart  0-5 Units Subcutaneous QHS  . insulin aspart  0-9 Units Subcutaneous TID WC  . insulin detemir  50 Units Subcutaneous QHS  . levothyroxine  224 mcg Oral QAC breakfast  . liraglutide  1.2 mg Subcutaneous Daily  . nystatin   Topical BID  . pantoprazole  40 mg Oral Daily  . potassium chloride  20 mEq Oral BID  . sodium chloride flush  3 mL Intravenous Q12H  . topiramate  75 mg Oral Daily  . umeclidinium-vilanterol  1 puff Inhalation Daily   Continuous Infusions: . sodium chloride    . sodium chloride 75 mL/hr at 05/18/17 0835   PRN Meds: sodium chloride, acetaminophen **OR** acetaminophen, albuterol, ALPRAZolam, ondansetron, oxyCODONE-acetaminophen **AND** oxyCODONE, sodium chloride flush, tiZANidine  Allergies:    Allergies  Allergen Reactions  . Dilaudid [Hydromorphone Hcl] Itching  . Actifed Cold-Allergy [Chlorpheniramine-Phenylephrine]     "I was sick and red and it didn't agree with me at all"  . Doxycycline Nausea And  Vomiting  . Other Cough    Pt states she is allergic to ragweed and that she starts coughing and sneezing like crazy  . Penicillins Hives and Itching    Tolerates Rocephin Has patient had a PCN reaction causing immediate rash, facial/tongue/throat swelling, SOB or lightheadedness with hypotension: Yes Has patient had a PCN reaction causing severe rash involving mucus membranes or skin necrosis: No Has patient had a PCN reaction that required hospitalization Yes Has patient had a PCN reaction occurring within the last 10 years: No If all of the above answers are "NO", then may proceed with Cephalosporin use.   . Reglan [Metoclopramide] Itching  . Valium [Diazepam] Itching  . Vistaril [Hydroxyzine Hcl] Itching    Social History:   Social History   Socioeconomic History  . Marital status: Widowed    Spouse name: Not on file  . Number of children: Not on file  . Years of education: Not on file  . Highest education level: Not on file  Social Needs  . Financial resource strain: Not on file  . Food insecurity - worry: Not on file  . Food insecurity - inability: Not on file  . Transportation needs - medical: Not on file  . Transportation needs - non-medical: Not on file  Occupational History  . Not on file  Tobacco Use  . Smoking status: Former Smoker    Packs/day: 0.25    Types: Cigarettes    Last attempt to quit: 06/27/2015    Years since quitting: 1.8  . Smokeless tobacco: Never Used  Substance and Sexual Activity  . Alcohol use: No    Alcohol/week: 0.0 oz  . Drug use: No  . Sexual activity: Not Currently    Birth control/protection: Surgical  Other Topics Concern  . Not on file  Social History Narrative  . Not on file    Family History:    Family History  Problem Relation Age of Onset  . Stroke Mother   . Heart attack Father   . Diabetes Brother   . Cerebral palsy Brother   . Pneumonia Brother   . Diabetes Other   . Heart attack Other      ROS:  Please see  the history of present illness.   All other ROS reviewed and negative.     Physical Exam/Data:   Vitals:   05/17/17 2204 05/17/17 2222 05/18/17 0451 05/18/17 0850  BP: 102/68  (!) 131/49   Pulse: 65  (!) 56   Resp: 18  18   Temp: 98.3 F (36.8 C)  98.3 F (36.8 C)   TempSrc: Oral  Oral   SpO2: 100% 98% 98% 99%  Weight:   283 lb 1.1 oz (128.4 kg)     Intake/Output Summary (Last 24 hours) at 05/18/2017 0859 Last data filed at 05/18/2017 0738 Gross per 24 hour  Intake 480 ml  Output 1750 ml  Net -1270 ml   Filed Weights   05/16/17 0457 05/17/17 0649 05/18/17 0451  Weight: 270 lb 15.1 oz (122.9 kg) 277 lb 12.5 oz (126 kg) 283 lb 1.1 oz (128.4 kg)   Body mass index is 45.69 kg/m.  General:  Chronically ill appearing female laying in bed in NAD HEENT: sclera anicteric  Neck: JVD difficulty to appreciate given body habitus Vascular: No carotid bruits; distal pulses 2+ bilaterally Cardiac:  Heart sounds distant due to body habitus, RRR; no obvious murmur, gallops, or rubs Lungs:  Receiving O2 via [place] (2L), clear to auscultation bilaterally, no wheezing, rhonchi or rales  Abd: NABS, obese, soft, nontender, no hepatomegaly Ext: 1+ edema b/l LE Musculoskeletal:  No deformities, BUE and BLE strength normal and equal Skin: warm and dry  Neuro:  CNs 2-12 intact, no focal abnormalities noted Psych:  Anxious   EKG:  The EKG was personally reviewed and demonstrates:  Sinus rhythm, no STE/D, no TWI Telemetry:  Telemetry was personally reviewed and demonstrates:  Not on telemetry - nothing to review  Relevant CV Studies: ECHO 5/0/93: PMH of diastolic HF (EF 26%), HTN, COPD, DM type 2, hypothyroidism, and neuropathy Study Conclusions  - Left ventricle: The cavity size was normal. Wall thickness was   increased in a pattern of moderate LVH. Systolic function was   normal. The estimated ejection fraction was in the range of 60%   to 65%. Wall motion was normal; there were no regional  wall   motion abnormalities. Doppler parameters are consistent with   abnormal left ventricular relaxation (grade 1 diastolic   dysfunction). - Mitral valve: Mildly to moderately calcified annulus. - Left atrium: The atrium was moderately dilated. - Right ventricle: The cavity size was mildly dilated. Wall   thickness was normal. Systolic function was mildly reduced. - Right atrium: The atrium was moderately dilated. - Pulmonary arteries: Systolic pressure was mildly to moderately   increased.  Nuclear Stress Test: 05/16/17  There was no ST segment deviation noted during stress.  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 Prior small basal septal infarct.  This is an intermediate risk study. Borderline TID of 1.29 may suggest higher risk and balanced ischemia  The left ventricular ejection fraction is normal (55-65%).  Abnormal nuclear stress, moderate ischemia in apex.    Laboratory Data:  Chemistry Recent Labs  Lab 05/15/17 1837 05/17/17 0602 05/18/17 0618  NA 137 137 135  K 4.0 4.2 4.5  CL 101 101 97*  CO2 29 28 29   GLUCOSE 179* 210* 199*  BUN 29* 28* 30*  CREATININE 1.21* 1.44* 1.66*  CALCIUM 9.0 8.7* 9.2  GFRNONAA 45* 37* 31*  GFRAA 52* 42* 36*  ANIONGAP 7 8 9     Recent Labs  Lab 05/15/17 1837 05/17/17 0602 05/18/17 0618  PROT 7.0 5.9* 7.2  ALBUMIN 3.6 3.0* 3.7  AST 16 11* 15  ALT 11* 8* 10*  ALKPHOS 82 69 82  BILITOT 0.5 0.5 0.5   Hematology Recent Labs  Lab 05/15/17 1837 05/17/17 0602 05/18/17 0618  WBC 7.2 6.5 6.7  RBC 4.46 4.03 4.51  HGB 11.4* 10.5* 11.8*  HCT 38.0 34.6* 39.1  MCV 85.2 85.9 86.7  MCH 25.6* 26.1 26.2  MCHC 30.0 30.3 30.2  RDW 15.9* 15.9* 15.8*  PLT 333 272 325   Cardiac Enzymes Recent Labs  Lab 05/15/17 1837 05/16/17 0011 05/16/17 0517  TROPONINI <0.03 <  0.03 <0.03   No results for input(s): TROPIPOC in the last 168 hours.  BNPNo results for  input(s): BNP, PROBNP in the last 168 hours.  DDimer No results for input(s): DDIMER in the last 168 hours.  Radiology/Studies:  Nm Myocar Multi W/spect W/wall Motion / Ef  Result Date: 05/17/2017  There was no ST segment deviation noted during stress.  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 Prior small basal septal infarct.  This is an intermediate risk study. Borderline TID of 1.29 may suggest higher risk and balanced ischemia  The left ventricular ejection fraction is normal (55-65%).    Dg Chest Portable 1 View  Result Date: 05/16/2017 CLINICAL DATA:  Chest pain EXAM: PORTABLE CHEST 1 VIEW COMPARISON:  01/30/2017 FINDINGS: Patient rotation limits evaluation. Heart size and pulmonary vascularity are normal. Linear atelectasis in the lung bases, slightly more pronounced than on previous study. No developing consolidation or airspace disease. No pneumothorax. IMPRESSION: Shallow inspiration with linear atelectasis in the lung bases. Electronically Signed   By: Lucienne Capers M.D.   On: 05/16/2017 00:59    Assessment and Plan:   1. Chest pain: patient is tangential and has difficulty pinpointing nature of chest pain, although sounds atypical. Could have anxiety component. Risk factors for ACS include HTN, DM, HLD obesity, family history (dad with MI in 37s).   - Troponin negative x3 - EKG non-ischemic - Echo with stable EF and no evidence of wall motion abnormalities - Nuclear stress test concerning for moderate ischemia at the apex - Will plan for cardiac cath once Cr improved. Plan for 1L NS today and hold lasix. Will reassess daily to determine timing of cath. - Continue ASA  2. Acute on Chronic Diastolic Heart Failure:   - CXR without vascular congestion - Diureses with IV lasix 40mg  BID  - Total output this admission: -5.1L - Wt down 4lb this admission - Cr bumped up to 1.66 today - IV lasix on hold. Would  continue to hold until Cr improved in anticipation of cardiac cath.  3. HTN: BP well controlled - Continue current regimen  4. HLD: - Continue Statin  5. DM: - Hgb A1C 7.7 - Continue ISS per primary team  6. Hypothyroid - TSH wnl - Continue levothyroxine per primary team     For questions or updates, please contact [place] Please consult [email or web address] for contact info under Cardiology/STEMI.   Signed, Abigail Butts, PA-C  05/18/2017 8:59 AM [phone] As above, patient seen and examined.  Briefly she is a 67 year old female with past medical history of chronic diastolic congestive heart failure, diabetes mellitus, hypertension, hyperlipidemia, home O2 dependent COPD for evaluation of chest pain and abnormal nuclear study.  Patient was admitted with complaints of progressive lower extremity edema, weight gain and worsening dyspnea.  She also describes intermittent chest pain which sounds to be chronic.  The pain is in her left chest radiating to her shoulder and left upper extremity.  Described as a tightness and burning pain.  Some improvement with nitroglycerin.  Lasts 15-45 minutes.  Enzymes negative.  Electrocardiogram shows sinus rhythm with no ST changes.  Nuclear study shows normal LV function and prior apical infarct with peri-infarct ischemia.  BUN 30 and creatinine 1.66.  Echocardiogram shows normal LV function, biatrial enlargement and mild right ventricular enlargement.  1 chest pain-symptoms with both typical and atypical features.  Enzymes negative.  Nuclear study shows possible anterior ischemia though breast attenuation  cannot be excluded.  We will plan definitive evaluation with cardiac catheterization.  The risks and benefits including myocardial infarction, CVA and death discussed and she agrees to proceed.  Her renal function has deteriorated since admission with diuresis.  Hold diuretics.  We will proceed with cardiac catheterization as creatinine improves.   Continue aspirin, statin and Cardizem.  Note she has home O2 dependent COPD and limited functional capacity.  She would not be a candidate for coronary artery bypass and graft but we are looking for a lesion amenable to PCI.  2 acute on chronic diastolic congestive heart failure-she is not markedly volume overloaded today.  Hold diuretics in anticipation of cardiac catheterization.  Would proceed with right heart catheterization at time of procedure to evaluate filling pressures.  3 acute on chronic stage III kidney disease-patient would be at risk for contrast nephropathy.  Hold diuretics and check renal function daily prior to catheterization.  4 hypertension-blood pressure is controlled.  Continue present medications.  5 home O2 dependent COPD  Kirk Ruths, MD

## 2017-05-18 NOTE — Progress Notes (Addendum)
Patient ID: Stephanie Combs, female   DOB: [birth year], 67 y.o.   MRN: [id number]                                                                PROGRESS NOTE                                                                                                                                                                                                             Patient Demographics:    Stephanie Combs, is a 67 y.o. female, DOB - [birth year], UVO:[id number]  Admit date - 05/15/2017   Admitting Physician Stephanie Gravel, MD  Outpatient Primary MD for the patient is Stephanie Gravel, MD  LOS - 3  Outpatient Specialists: [facility] in [place] ? Branch  No chief complaint on file.      Brief Narrative   67 y.o.female,w hypothyroidism, hypertension, CHF (EF 65%), Copd Dm2 w neuropathy apparently c/o increase in bilateral lower ext edema, + weight gain , dyspnea, orthopnea, as well as chest pain "burning" and squeezing and radiation to the left arm for the past 2 week. Intermittent. Last had chest pain last nite. Pt requested direct admission for chest pain and dyspnea and lower ext edema.       Subjective:    Stephanie Combs today is not currently having chest discomfort.  She had some chest pain during her stress testing.  Pt has mild renal insufficiency due to lasix, and having been NPO per the last 2 nite, can't eat or drink per pt.  Lower ext edema (failed outpatient diuresis), better.  Breathing better as well on Anoro.     No abdominal pain - No Nausea, No new weakness tingling or numbness, No Cough -   Assessment  & Plan :    Active Problems:   DM type 2 (diabetes mellitus, type 2) (HCC)   Dyspnea   Edema     Chest pain, nuclear stress test =>intermediate risk Tele NPO currently  Cont aspirin lipitor 80mg  po qhs Cardiology consult, appreciated   Dyspnea/ wheezing likely mild Copd exacerbation Cont Anoro Cont albuterol neb tid   LE edema (failed outpatient  diuresis) Hold lasix due to renal insufficiency cmp in am  Dm2/neuropathy fsbs ac and qhs iss  Hypothyroidism Cont levothyroxine  Anemia Check  cbc in am  Abdominal wound, 3x2cm pink granulation tissue Continue wound care  Code Status:DNR  Family Communication :w patient  Disposition Plan:home  Barriers For Discharge:  Consults :cardiology consult phoned in   Procedures :   DVT Prophylaxis: LovenoxSCDs      Lab Results  Component Value Date   PLT 325 05/18/2017    Antibiotics  :  none  Anti-infectives (From admission, onward)   None        Objective:   Vitals:   05/17/17 1432 05/17/17 2204 05/17/17 2222 05/18/17 0451  BP: 123/78 102/68  (!) 131/49  Pulse: 64 65  (!) 56  Resp: 18 18  18   Temp: 98.7 F (37.1 C) 98.3 F (36.8 C)  98.3 F (36.8 C)  TempSrc: Oral Oral  Oral  SpO2: 100% 100% 98% 98%  Weight:    128.4 kg (283 lb 1.1 oz)    Wt Readings from Last 3 Encounters:  05/18/17 128.4 kg (283 lb 1.1 oz)  02/02/17 133.4 kg (294 lb 1.5 oz)  01/26/17 (!) 141.1 kg (311 lb)     Intake/Output Summary (Last 24 hours) at 05/18/2017 0756 Last data filed at 05/18/2017 0738 Gross per 24 hour  Intake 480 ml  Output 1750 ml  Net -1270 ml     Physical Exam  Awake Alert, Oriented X 3, No new F.N deficits, Normal affect [place].AT,PERRAL Supple Neck,No JVD, No cervical lymphadenopathy appriciated.  Symmetrical Chest wall movement, Good air movement bilaterally, slight exp wheezing, no crackle RRR,No Gallops,Rubs or new Murmurs, No Parasternal Heave +ve B.Sounds, Abd Soft, No tenderness, No organomegaly appriciated, No rebound - guarding or rigidity. No Cyanosis, Clubbing  1+ edema  2x3cm oval wound mid lower abdomen.      Data Review:    CBC Recent Labs  Lab 05/15/17 1837 05/17/17 0602 05/18/17 0618  WBC 7.2 6.5 6.7  HGB 11.4* 10.5* 11.8*  HCT 38.0 34.6* 39.1  PLT 333 272 325  MCV 85.2 85.9 86.7  MCH  25.6* 26.1 26.2  MCHC 30.0 30.3 30.2  RDW 15.9* 15.9* 15.8*    Chemistries  Recent Labs  Lab 05/15/17 1837 05/17/17 0602 05/18/17 0618  NA 137 137 135  K 4.0 4.2 4.5  CL 101 101 97*  CO2 29 28 29   GLUCOSE 179* 210* 199*  BUN 29* 28* 30*  CREATININE 1.21* 1.44* 1.66*  CALCIUM 9.0 8.7* 9.2  AST 16 11* 15  ALT 11* 8* 10*  ALKPHOS 82 69 82  BILITOT 0.5 0.5 0.5   ------------------------------------------------------------------------------------------------------------------ No results for input(s): CHOL, HDL, LDLCALC, TRIG, CHOLHDL, LDLDIRECT in the last 72 hours.  Lab Results  Component Value Date   HGBA1C 7.7 (H) 05/15/2017   ------------------------------------------------------------------------------------------------------------------ Recent Labs    05/15/17 1837  TSH 0.320*   ------------------------------------------------------------------------------------------------------------------ No results for input(s): VITAMINB12, FOLATE, FERRITIN, TIBC, IRON, RETICCTPCT in the last 72 hours.  Coagulation profile Recent Labs  Lab 05/16/17 0517  INR 0.99    No results for input(s): DDIMER in the last 72 hours.  Cardiac Enzymes Recent Labs  Lab 05/15/17 1837 05/16/17 0011 05/16/17 0517  TROPONINI <0.03 <0.03 <0.03   ------------------------------------------------------------------------------------------------------------------    Component Value Date/Time   BNP 108.0 (H) 05/01/2017 1000    Inpatient Medications  Scheduled Meds: . albuterol  2.5 mg Nebulization TID  . aspirin EC  81 mg Oral Daily  . cholecalciferol  5,000 Units Oral Daily  . diltiazem  300 mg Oral Daily  . enoxaparin (LOVENOX) injection  0.5 mg/kg Subcutaneous Q24H  . famotidine  20 mg Oral QHS  . feeding supplement (PRO-STAT SUGAR FREE 64)  30 mL Oral BID  . ferrous sulfate  325 mg Oral Q breakfast  . gabapentin  300 mg Oral TID  . insulin aspart  0-5 Units Subcutaneous QHS  .  insulin aspart  0-9 Units Subcutaneous TID WC  . insulin detemir  50 Units Subcutaneous QHS  . levothyroxine  224 mcg Oral QAC breakfast  . liraglutide  1.2 mg Subcutaneous Daily  . nystatin   Topical BID  . pantoprazole  40 mg Oral Daily  . potassium chloride  20 mEq Oral BID  . sodium chloride flush  3 mL Intravenous Q12H  . topiramate  75 mg Oral Daily  . umeclidinium-vilanterol  1 puff Inhalation Daily   Continuous Infusions: . sodium chloride     PRN Meds:.sodium chloride, acetaminophen **OR** acetaminophen, albuterol, ALPRAZolam, ondansetron, oxyCODONE-acetaminophen **AND** oxyCODONE, sodium chloride flush, tiZANidine  Micro Results Recent Results (from the past 240 hour(s))  Culture, Urine     Status: None   Collection Time: 05/15/17  9:19 PM  Result Value Ref Range Status   Specimen Description URINE, CLEAN CATCH  Final   Special Requests NONE  Final   Culture   Final    NO GROWTH Performed at [facility], [place] [address], [place], [place] [address]    Report Status 05/17/2017 FINAL  Final    Radiology Reports Nm Myocar Multi W/spect W/wall Motion / Ef  Result Date: 05/17/2017  There was no ST segment deviation noted during stress.  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 Prior small basal septal infarct.  This is an intermediate risk study. Borderline TID of 1.29 may suggest higher risk and balanced ischemia  The left ventricular ejection fraction is normal (55-65%).    Dg Chest Portable 1 View  Result Date: 05/16/2017 CLINICAL DATA:  Chest pain EXAM: PORTABLE CHEST 1 VIEW COMPARISON:  01/30/2017 FINDINGS: Patient rotation limits evaluation. Heart size and pulmonary vascularity are normal. Linear atelectasis in the lung bases, slightly more pronounced than on previous study. No developing consolidation or airspace disease. No pneumothorax. IMPRESSION: Shallow inspiration with linear  atelectasis in the lung bases. Electronically Signed   By: Lucienne Capers M.D.   On: 05/16/2017 00:59    Time Spent in minutes  30   Stephanie Combs M.D on 05/18/2017 at 7:56 AM  Between 7am to 7am- Pager - [phone]

## 2017-05-18 NOTE — Care Management Important Message (Signed)
Important Message  Patient Details  Name: BREEAN NANNINI MRN: [id number] Date of Birth: 05-28-1950   Medicare Important Message Given:  Yes    Kerin Salen 05/18/2017, 11:25 AMImportant Message  Patient Details  Name: CAILEEN VERACRUZ MRN: [id number] Date of Birth: [birth year]   Medicare Important Message Given:  Yes    Kerin Salen 05/18/2017, 11:25 AM

## 2017-05-19 ENCOUNTER — Encounter (HOSPITAL_COMMUNITY)
Admission: AD | Disposition: A | Discharge status: Home Health | Payer: Self-pay | Source: Ambulatory Visit | Attending: Internal Medicine

## 2017-05-19 DIAGNOSIS — R9439 Abnormal result of other cardiovascular function study: Secondary | ICD-10-CM | POA: Diagnosis present

## 2017-05-19 DIAGNOSIS — R0600 Dyspnea, unspecified: Secondary | ICD-10-CM

## 2017-05-19 DIAGNOSIS — I208 Other forms of angina pectoris: Secondary | ICD-10-CM | POA: Clinically undetermined

## 2017-05-19 HISTORY — PX: RIGHT/LEFT HEART CATH AND CORONARY ANGIOGRAPHY: CATH118266

## 2017-05-19 LAB — COMPREHENSIVE METABOLIC PANEL
ALT: 10 U/L — ABNORMAL LOW (ref 14–54)
AST: 13 U/L — ABNORMAL LOW (ref 15–41)
Albumin: 3 g/dL — ABNORMAL LOW (ref 3.5–5.0)
Alkaline Phosphatase: 71 U/L (ref 38–126)
Anion gap: 6 (ref 5–15)
BUN: 25 mg/dL — ABNORMAL HIGH (ref 6–20)
CO2: 27 mmol/L (ref 22–32)
Calcium: 8.8 mg/dL — ABNORMAL LOW (ref 8.9–10.3)
Chloride: 102 mmol/L (ref 101–111)
Creatinine, Ser: 1.06 mg/dL — ABNORMAL HIGH (ref 0.44–1.00)
GFR calc Af Amer: >60 mL/min (ref >60)
GFR calc non Af Amer: 53 mL/min — ABNORMAL LOW (ref >60)
Glucose, Bld: 176 mg/dL — ABNORMAL HIGH (ref 65–99)
Potassium: 4.1 mmol/L (ref 3.5–5.1)
Sodium: 135 mmol/L (ref 135–145)
Total Bilirubin: 0.5 mg/dL (ref 0.3–1.2)
Total Protein: 5.8 g/dL — ABNORMAL LOW (ref 6.5–8.1)

## 2017-05-19 LAB — POCT I-STAT 3, ART BLOOD GAS (G3+)
Acid-Base Excess: 1 mmol/L (ref 0.0–2.0)
Bicarbonate: 27.1 mmol/L (ref 20.0–28.0)
O2 Saturation: 94 %
TCO2: 29 mmol/L (ref 22–32)
pCO2 arterial: 49.8 mmHg — ABNORMAL HIGH (ref 32.0–48.0)
pH, Arterial: 7.344 — ABNORMAL LOW (ref 7.350–7.450)
pO2, Arterial: 74 mmHg — ABNORMAL LOW (ref 83.0–108.0)

## 2017-05-19 LAB — POCT I-STAT 3, VENOUS BLOOD GAS (G3P V)
Acid-Base Excess: 2 mmol/L (ref 0.0–2.0)
Acid-Base Excess: 2 mmol/L (ref 0.0–2.0)
Bicarbonate: 28.8 mmol/L — ABNORMAL HIGH (ref 20.0–28.0)
Bicarbonate: 29.2 mmol/L — ABNORMAL HIGH (ref 20.0–28.0)
O2 Saturation: 56 %
O2 Saturation: 59 %
TCO2: 30 mmol/L (ref 22–32)
TCO2: 31 mmol/L (ref 22–32)
pCO2, Ven: 55.8 mmHg (ref 44.0–60.0)
pCO2, Ven: 57.1 mmHg (ref 44.0–60.0)
pH, Ven: 7.316 (ref 7.250–7.430)
pH, Ven: 7.32 (ref 7.250–7.430)
pO2, Ven: 33 mmHg (ref 32.0–45.0)
pO2, Ven: 34 mmHg (ref 32.0–45.0)

## 2017-05-19 LAB — LIPID PANEL
Cholesterol: 164 mg/dL (ref 0–200)
HDL: 36 mg/dL — ABNORMAL LOW (ref >40)
LDL Cholesterol: 107 mg/dL — ABNORMAL HIGH (ref 0–99)
Total CHOL/HDL Ratio: 4.6 RATIO
Triglycerides: 104 mg/dL (ref <150)
VLDL: 21 mg/dL (ref 0–40)

## 2017-05-19 LAB — GLUCOSE, CAPILLARY
Glucose-Capillary: 173 mg/dL — ABNORMAL HIGH (ref 65–99)
Glucose-Capillary: 90 mg/dL (ref 65–99)
Glucose-Capillary: 91 mg/dL (ref 65–99)
Glucose-Capillary: 251 mg/dL — ABNORMAL HIGH (ref 65–99)

## 2017-05-19 LAB — CBC
HCT: 35 % — ABNORMAL LOW (ref 36.0–46.0)
Hemoglobin: 10.7 g/dL — ABNORMAL LOW (ref 12.0–15.0)
MCH: 26.4 pg (ref 26.0–34.0)
MCHC: 30.6 g/dL (ref 30.0–36.0)
MCV: 86.2 fL (ref 78.0–100.0)
Platelets: 281 10*3/uL (ref 150–400)
RBC: 4.06 MIL/uL (ref 3.87–5.11)
RDW: 15.6 % — ABNORMAL HIGH (ref 11.5–15.5)
WBC: 5.9 10*3/uL (ref 4.0–10.5)

## 2017-05-19 SURGERY — RIGHT/LEFT HEART CATH AND CORONARY ANGIOGRAPHY
Anesthesia: LOCAL

## 2017-05-19 MED ORDER — HEPARIN (PORCINE) IN NACL 100-0.45 UNIT/ML-% IJ SOLN
1400.0000 [IU]/h | INTRAMUSCULAR | Status: DC
  Administered 2017-05-20: 1250 [IU]/h via INTRAVENOUS
  Administered 2017-05-20: 1400 [IU]/h via INTRAVENOUS
  Filled 2017-05-19 (×3): qty 250

## 2017-05-19 MED ORDER — IOPAMIDOL (ISOVUE-370) INJECTION 76%
INTRAVENOUS | Status: AC
  Filled 2017-05-19: qty 100

## 2017-05-19 MED ORDER — VERAPAMIL HCL 2.5 MG/ML IV SOLN
INTRAVENOUS | Status: AC
  Filled 2017-05-19: qty 2

## 2017-05-19 MED ORDER — FENTANYL CITRATE (PF) 100 MCG/2ML IJ SOLN
INTRAMUSCULAR | Status: AC
  Filled 2017-05-19: qty 2

## 2017-05-19 MED ORDER — HEPARIN SODIUM (PORCINE) 1000 UNIT/ML IJ SOLN
INTRAMUSCULAR | Status: DC | PRN
  Administered 2017-05-19: 5000 [IU] via INTRAVENOUS

## 2017-05-19 MED ORDER — LIDOCAINE HCL (PF) 1 % IJ SOLN
INTRAMUSCULAR | Status: DC | PRN
  Administered 2017-05-19 (×2): 2 mL via INTRADERMAL

## 2017-05-19 MED ORDER — OXYCODONE HCL 5 MG PO TABS
ORAL_TABLET | ORAL | Status: AC
  Filled 2017-05-19: qty 1

## 2017-05-19 MED ORDER — SODIUM CHLORIDE 0.9 % IV SOLN
250.0000 mL | INTRAVENOUS | Status: DC | PRN
  Administered 2017-05-20: 250 mL via INTRAVENOUS

## 2017-05-19 MED ORDER — IOPAMIDOL (ISOVUE-370) INJECTION 76%
INTRAVENOUS | Status: DC | PRN
  Administered 2017-05-19: 75 mL via INTRA_ARTERIAL

## 2017-05-19 MED ORDER — HEPARIN (PORCINE) IN NACL 2-0.9 UNIT/ML-% IJ SOLN
INTRAMUSCULAR | Status: AC
  Filled 2017-05-19: qty 1000

## 2017-05-19 MED ORDER — NITROGLYCERIN 1 MG/10 ML FOR IR/CATH LAB
INTRA_ARTERIAL | Status: DC | PRN
Start: 2017-05-19 — End: 2017-05-19
  Administered 2017-05-19: 200 ug via INTRACORONARY

## 2017-05-19 MED ORDER — SODIUM CHLORIDE 0.9 % IV SOLN
INTRAVENOUS | Status: DC
  Administered 2017-05-19: 01:00:00 via INTRAVENOUS

## 2017-05-19 MED ORDER — HEPARIN SODIUM (PORCINE) 1000 UNIT/ML IJ SOLN
INTRAMUSCULAR | Status: AC
  Filled 2017-05-19: qty 1

## 2017-05-19 MED ORDER — SODIUM CHLORIDE 0.9% FLUSH: 3.0000 mL | INTRAVENOUS | Status: DC | PRN

## 2017-05-19 MED ORDER — NITROGLYCERIN 1 MG/10 ML FOR IR/CATH LAB
INTRA_ARTERIAL | Status: AC
  Filled 2017-05-19: qty 10

## 2017-05-19 MED ORDER — VERAPAMIL HCL 2.5 MG/ML IV SOLN
INTRAVENOUS | Status: DC | PRN
  Administered 2017-05-19: 10 mL via INTRA_ARTERIAL

## 2017-05-19 MED ORDER — OXYCODONE-ACETAMINOPHEN 5-325 MG PO TABS
ORAL_TABLET | ORAL | Status: AC
  Filled 2017-05-19: qty 1

## 2017-05-19 MED ORDER — LIDOCAINE HCL (PF) 1 % IJ SOLN
INTRAMUSCULAR | Status: AC
  Filled 2017-05-19: qty 30

## 2017-05-19 MED ORDER — ENOXAPARIN SODIUM 60 MG/0.6ML ~~LOC~~ SOLN: 60.0000 mg | SUBCUTANEOUS | Status: DC

## 2017-05-19 MED ORDER — HEPARIN (PORCINE) IN NACL 2-0.9 UNIT/ML-% IJ SOLN
INTRAMUSCULAR | Status: AC | PRN
  Administered 2017-05-19: 1000 mL via INTRA_ARTERIAL

## 2017-05-19 MED ORDER — SODIUM CHLORIDE 0.9 % IV SOLN
INTRAVENOUS | Status: AC | PRN
  Administered 2017-05-19: 125 mL/h via INTRAVENOUS

## 2017-05-19 MED ORDER — ONDANSETRON HCL 4 MG/2ML IJ SOLN
4.0000 mg | Freq: Four times a day (QID) | INTRAMUSCULAR | Status: DC | PRN
  Administered 2017-05-20 – 2017-05-21 (×2): 4 mg via INTRAVENOUS
  Filled 2017-05-19 (×2): qty 2

## 2017-05-19 MED ORDER — ALPRAZOLAM 0.25 MG PO TABS
ORAL_TABLET | ORAL | Status: AC
  Filled 2017-05-19: qty 4

## 2017-05-19 MED ORDER — SODIUM CHLORIDE 0.9 % IV SOLN: INTRAVENOUS | Status: AC

## 2017-05-19 MED ORDER — SODIUM CHLORIDE 0.9% FLUSH
3.0000 mL | Freq: Two times a day (BID) | INTRAVENOUS | Status: DC
  Administered 2017-05-20 – 2017-05-23 (×4): 3 mL via INTRAVENOUS

## 2017-05-19 MED ORDER — FENTANYL CITRATE (PF) 100 MCG/2ML IJ SOLN
INTRAMUSCULAR | Status: DC | PRN
  Administered 2017-05-19: 25 ug via INTRAVENOUS

## 2017-05-19 SURGICAL SUPPLY — 12 items
CATH BALLN WEDGE 5F 110CM (CATHETERS) ×2 IMPLANT
CATH IMPULSE 5F ANG/FL3.5 (CATHETERS) ×2 IMPLANT
DEVICE RAD COMP TR BAND LRG (VASCULAR PRODUCTS) ×2 IMPLANT
GLIDESHEATH SLEND SS 6F .021 (SHEATH) ×2 IMPLANT
GUIDEWIRE INQWIRE 1.5J.035X260 (WIRE) ×1 IMPLANT
INQWIRE 1.5J .035X260CM (WIRE) ×2
KIT HEART LEFT (KITS) ×2 IMPLANT
PACK CARDIAC CATHETERIZATION (CUSTOM PROCEDURE TRAY) ×2 IMPLANT
SHEATH GLIDE SLENDER 4/5FR (SHEATH) ×2 IMPLANT
TRANSDUCER W/STOPCOCK (MISCELLANEOUS) ×2 IMPLANT
TUBING CIL FLEX 10 FLL-RA (TUBING) ×2 IMPLANT
WIRE EMERALD 3MM-J .025X260CM (WIRE) ×2 IMPLANT

## 2017-05-19 NOTE — Progress Notes (Signed)
Progress Note  Patient Name: Stephanie Combs Date of Encounter: 05/19/2017  Primary Cardiologist: Carlyle Dolly, MD   Subjective   No dyspnea; brief CP last PM; none this AM  Inpatient Medications    Scheduled Meds: . albuterol  2.5 mg Nebulization TID  . aspirin EC  81 mg Oral Daily  . atorvastatin  80 mg Oral q1800  . cholecalciferol  5,000 Units Oral Daily  . diltiazem  300 mg Oral Daily  . enoxaparin (LOVENOX) injection  0.5 mg/kg Subcutaneous Q24H  . famotidine  20 mg Oral QHS  . feeding supplement (PRO-STAT SUGAR FREE 64)  30 mL Oral BID  . ferrous sulfate  325 mg Oral Q breakfast  . gabapentin  300 mg Oral TID  . Gerhardt's butt cream   Topical TID  . insulin aspart  0-5 Units Subcutaneous QHS  . insulin aspart  0-9 Units Subcutaneous TID WC  . insulin detemir  50 Units Subcutaneous QHS  . levothyroxine  224 mcg Oral QAC breakfast  . liraglutide  1.2 mg Subcutaneous Daily  . nystatin   Topical BID  . pantoprazole  40 mg Oral Daily  . sodium chloride flush  3 mL Intravenous Q12H  . topiramate  75 mg Oral Daily  . umeclidinium-vilanterol  1 puff Inhalation Daily   Continuous Infusions: . sodium chloride    . sodium chloride 50 mL/hr at 05/19/17 0045   PRN Meds: sodium chloride, acetaminophen **OR** acetaminophen, albuterol, ALPRAZolam, ondansetron, oxyCODONE-acetaminophen **AND** oxyCODONE, sodium chloride flush, tiZANidine   Vital Signs    Vitals:   05/18/17 1646 05/18/17 2042 05/18/17 2131 05/19/17 0615  BP:  (!) 124/51  (!) 143/57  Pulse:  61  (!) 50  Resp:  19  16  Temp:  98.3 F (36.8 C)  98.4 F (36.9 C)  TempSrc:  Oral  Oral  SpO2: 100% 99% 96% 100%  Weight:    275 lb 5.7 oz (124.9 kg)    Intake/Output Summary (Last 24 hours) at 05/19/2017 0723 Last data filed at 05/19/2017 0041 Gross per 24 hour  Intake 756.75 ml  Output 900 ml  Net -143.25 ml   Filed Weights   05/17/17 0649 05/18/17 0451 05/19/17 0615  Weight: 277 lb 12.5 oz (126 kg)  283 lb 1.1 oz (128.4 kg) 275 lb 5.7 oz (124.9 kg)    Telemetry    Sinus - Personally Reviewed  Physical Exam   GEN: WD, morbidly obese No acute distress.   Neck: No JVD Cardiac: RRR, no murmurs, rubs, or gallops.  Respiratory: Clear to auscultation bilaterally. GI: Soft, nontender, non-distended  MS: Trace edema. Neuro:  Nonfocal  Psych: Normal affect   Labs    Chemistry Recent Labs  Lab 05/17/17 0602 05/18/17 0618 05/19/17 0622  NA 137 135 135  K 4.2 4.5 4.1  CL 101 97* 102  CO2 28 29 27   GLUCOSE 210* 199* 176*  BUN 28* 30* 25*  CREATININE 1.44* 1.66* 1.06*  CALCIUM 8.7* 9.2 8.8*  PROT 5.9* 7.2 5.8*  ALBUMIN 3.0* 3.7 3.0*  AST 11* 15 13*  ALT 8* 10* 10*  ALKPHOS 69 82 71  BILITOT 0.5 0.5 0.5  GFRNONAA 37* 31* 53*  GFRAA 42* 36* >60  ANIONGAP 8 9 6      Hematology Recent Labs  Lab 05/17/17 0602 05/18/17 0618 05/19/17 0622  WBC 6.5 6.7 5.9  RBC 4.03 4.51 4.06  HGB 10.5* 11.8* 10.7*  HCT 34.6* 39.1 35.0*  MCV 85.9 86.7 86.2  MCH 26.1 26.2 26.4  MCHC 30.3 30.2 30.6  RDW 15.9* 15.8* 15.6*  PLT 272 325 281    Cardiac Enzymes Recent Labs  Lab 05/15/17 1837 05/16/17 0011 05/16/17 0517  TROPONINI <0.03 <0.03 <0.03    Radiology    Nm Myocar Multi W/spect W/wall Motion / Ef  Result Date: 05/17/2017  There was no ST segment deviation noted during stress.  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 Prior small basal septal infarct.  This is an intermediate risk study. Borderline TID of 1.29 may suggest higher risk and balanced ischemia  The left ventricular ejection fraction is normal (55-65%).     Patient Profile     67 year old female with past medical history of chronic diastolic congestive heart failure, diabetes mellitus, hypertension, hyperlipidemia, home O2 dependent COPD for evaluation of chest pain and abnormal nuclear study. Enzymes negative. Nuclear study shows normal  LV function and prior apical infarct with peri-infarct ischemia. Echocardiogram shows normal LV function, biatrial enlargement and mild right ventricular enlargement.    Assessment & Plan    1 chest pain-continues with intermittent CP. Enzymes negative.  Nuclear study shows possible anterior ischemia though breast attenuation cannot be excluded.  We will plan definitive evaluation with cardiac catheterization.  The risks and benefits including myocardial infarction, CVA and death discussed previously and she agrees to proceed. Renal function better with gentle hydration; will arrange cardiac catheterization today. Continue aspirin, statin and Cardizem.  Note she has home O2 dependent COPD and limited functional capacity.  She would not be a candidate for coronary artery bypass and graft but we are looking for a lesion amenable to PCI.  2 acute on chronic diastolic congestive heart failure-diuretics on hold for cath. Not markedly volume overloaded on exam. Will arrange right heart catheterization as well to evaluate filling pressures.  3 acute on chronic stage III kidney disease-diuretics on hold; recheck BMET in AM.   4 hypertension-blood pressure is controlled.  Continue present medications.  5 home O2 dependent COPD    For questions or updates, please contact [place] Please consult [email or web address] for contact info under Cardiology/STEMI.      Signed, Kirk Ruths, MD  05/19/2017, 7:23 AM

## 2017-05-19 NOTE — Interval H&P Note (Signed)
History and Physical Interval Note:  05/19/2017 3:22 PM  Stephanie Combs  has presented today for surgery, with the diagnosis of angina -abnormal nuclear stress test the various methods of treatment have been discussed with the patient and family. After consideration of risks, benefits and other options for treatment, the patient has consented to  Procedure(s): LEFT HEART CATH AND CORONARY ANGIOGRAPHY (N/A) with possible PERCUTANEOUS CORONARY INTERVENTION as a surgical intervention .  The patient's history has been reviewed, patient examined, no change in status, stable for surgery.  I have reviewed the patient's chart and labs.  Questions were answered to the patient's satisfaction.    Cath Lab Visit (complete for each Cath Lab visit)  Clinical Evaluation Leading to the Procedure:   ACS: No.  Non-ACS:    Anginal Classification: CCS III  Anti-ischemic medical therapy: Minimal Therapy (1 class of medications)  Non-Invasive Test Results: Intermediate-risk stress test findings: cardiac mortality 1-3%/year  Prior CABG: No previous CABG   Glenetta Hew

## 2017-05-19 NOTE — H&P (View-Only) (Signed)
Progress Note  Patient Name: Stephanie Combs Date of Encounter: 05/19/2017  Primary Cardiologist: Carlyle Dolly, MD   Subjective   No dyspnea; brief CP last PM; none this AM  Inpatient Medications    Scheduled Meds: . albuterol  2.5 mg Nebulization TID  . aspirin EC  81 mg Oral Daily  . atorvastatin  80 mg Oral q1800  . cholecalciferol  5,000 Units Oral Daily  . diltiazem  300 mg Oral Daily  . enoxaparin (LOVENOX) injection  0.5 mg/kg Subcutaneous Q24H  . famotidine  20 mg Oral QHS  . feeding supplement (PRO-STAT SUGAR FREE 64)  30 mL Oral BID  . ferrous sulfate  325 mg Oral Q breakfast  . gabapentin  300 mg Oral TID  . Gerhardt's butt cream   Topical TID  . insulin aspart  0-5 Units Subcutaneous QHS  . insulin aspart  0-9 Units Subcutaneous TID WC  . insulin detemir  50 Units Subcutaneous QHS  . levothyroxine  224 mcg Oral QAC breakfast  . liraglutide  1.2 mg Subcutaneous Daily  . nystatin   Topical BID  . pantoprazole  40 mg Oral Daily  . sodium chloride flush  3 mL Intravenous Q12H  . topiramate  75 mg Oral Daily  . umeclidinium-vilanterol  1 puff Inhalation Daily   Continuous Infusions: . sodium chloride    . sodium chloride 50 mL/hr at 05/19/17 0045   PRN Meds: sodium chloride, acetaminophen **OR** acetaminophen, albuterol, ALPRAZolam, ondansetron, oxyCODONE-acetaminophen **AND** oxyCODONE, sodium chloride flush, tiZANidine   Vital Signs    Vitals:   05/18/17 1646 05/18/17 2042 05/18/17 2131 05/19/17 0615  BP:  (!) 124/51  (!) 143/57  Pulse:  61  (!) 50  Resp:  19  16  Temp:  98.3 F (36.8 C)  98.4 F (36.9 C)  TempSrc:  Oral  Oral  SpO2: 100% 99% 96% 100%  Weight:    275 lb 5.7 oz (124.9 kg)    Intake/Output Summary (Last 24 hours) at 05/19/2017 0723 Last data filed at 05/19/2017 0041 Gross per 24 hour  Intake 756.75 ml  Output 900 ml  Net -143.25 ml   Filed Weights   05/17/17 0649 05/18/17 0451 05/19/17 0615  Weight: 277 lb 12.5 oz (126 kg)  283 lb 1.1 oz (128.4 kg) 275 lb 5.7 oz (124.9 kg)    Telemetry    Sinus - Personally Reviewed  Physical Exam   GEN: WD, morbidly obese No acute distress.   Neck: No JVD Cardiac: RRR, no murmurs, rubs, or gallops.  Respiratory: Clear to auscultation bilaterally. GI: Soft, nontender, non-distended  MS: Trace edema. Neuro:  Nonfocal  Psych: Normal affect   Labs    Chemistry Recent Labs  Lab 05/17/17 0602 05/18/17 0618 05/19/17 0622  NA 137 135 135  K 4.2 4.5 4.1  CL 101 97* 102  CO2 28 29 27   GLUCOSE 210* 199* 176*  BUN 28* 30* 25*  CREATININE 1.44* 1.66* 1.06*  CALCIUM 8.7* 9.2 8.8*  PROT 5.9* 7.2 5.8*  ALBUMIN 3.0* 3.7 3.0*  AST 11* 15 13*  ALT 8* 10* 10*  ALKPHOS 69 82 71  BILITOT 0.5 0.5 0.5  GFRNONAA 37* 31* 53*  GFRAA 42* 36* >60  ANIONGAP 8 9 6      Hematology Recent Labs  Lab 05/17/17 0602 05/18/17 0618 05/19/17 0622  WBC 6.5 6.7 5.9  RBC 4.03 4.51 4.06  HGB 10.5* 11.8* 10.7*  HCT 34.6* 39.1 35.0*  MCV 85.9 86.7 86.2  MCH 26.1 26.2 26.4  MCHC 30.3 30.2 30.6  RDW 15.9* 15.8* 15.6*  PLT 272 325 281    Cardiac Enzymes Recent Labs  Lab 05/15/17 1837 05/16/17 0011 05/16/17 0517  TROPONINI <0.03 <0.03 <0.03    Radiology    Nm Myocar Multi W/spect W/wall Motion / Ef  Result Date: 05/17/2017  There was no ST segment deviation noted during stress.  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 Prior small basal septal infarct.  This is an intermediate risk study. Borderline TID of 1.29 may suggest higher risk and balanced ischemia  The left ventricular ejection fraction is normal (55-65%).     Patient Profile     67 year old female with past medical history of chronic diastolic congestive heart failure, diabetes mellitus, hypertension, hyperlipidemia, home O2 dependent COPD for evaluation of chest pain and abnormal nuclear study. Enzymes negative. Nuclear study shows normal  LV function and prior apical infarct with peri-infarct ischemia. Echocardiogram shows normal LV function, biatrial enlargement and mild right ventricular enlargement.    Assessment & Plan    1 chest pain-continues with intermittent CP. Enzymes negative.  Nuclear study shows possible anterior ischemia though breast attenuation cannot be excluded.  We will plan definitive evaluation with cardiac catheterization.  The risks and benefits including myocardial infarction, CVA and death discussed previously and she agrees to proceed. Renal function better with gentle hydration; will arrange cardiac catheterization today. Continue aspirin, statin and Cardizem.  Note she has home O2 dependent COPD and limited functional capacity.  She would not be a candidate for coronary artery bypass and graft but we are looking for a lesion amenable to PCI.  2 acute on chronic diastolic congestive heart failure-diuretics on hold for cath. Not markedly volume overloaded on exam. Will arrange right heart catheterization as well to evaluate filling pressures.  3 acute on chronic stage III kidney disease-diuretics on hold; recheck BMET in AM.   4 hypertension-blood pressure is controlled.  Continue present medications.  5 home O2 dependent COPD    For questions or updates, please contact [place] Please consult [email or web address] for contact info under Cardiology/STEMI.      Signed, Kirk Ruths, MD  05/19/2017, 7:23 AM

## 2017-05-19 NOTE — Progress Notes (Signed)
Patient ID: Stephanie Combs, female   DOB: [birth year], 67 y.o.   MRN: [id number]                                                                PROGRESS NOTE                                                                                                                                                                                                             Patient Demographics:    Stephanie Combs, is a 67 y.o. female, DOB - [birth year], VUY:[id number]  Admit date - 05/15/2017   Admitting Physician [name], MD  Outpatient Primary MD for the patient is [name], MD  LOS - 4  Outpatient Specialists: Leonia Reader Dr. Avon Gully  No chief complaint on file.      Brief Narrative  67 y.o.female,w hypothyroidism, hypertension, CHF (EF 65%), Copd Dm2 w neuropathy apparently c/o increase in bilateral lower ext edema, + weight gain , dyspnea, orthopnea, as well as chest pain "burning" and squeezing and radiation to the left arm for the past 2 week. Intermittent. Last had chest pain last nite. Pt requested direct admission for chest pain and dyspnea and lower ext edema.       Subjective:    [name] today is s/p cardiac catheterization.  Pt is currently chest pain free.   Pt has been afebrile.  Lower ext edema. Improved.  Off lasix due to renal insufficiency. Denies dyspnea, orthopnea, pnd, lower ext edema.    Pt is contemplating cabg vs stenting of LAD lesions. She appears afraid of surgery    Assessment  & Plan :    Active Problems:   DM type 2 (diabetes mellitus, type 2) (HCC)   Dyspnea   Edema   Abnormal nuclear stress test   Atypical angina (HCC)    Chest pain, nuclear stress test =>intermediate risk S/p Cardiac cath 05/19/2017=>LAD lesions  Cont aspirin Cont Lipitor 80mg  po qhs Cont Cardizem  Heparin Iv per pharmacy  Cardiology consult, appreciated  Dyspnea/ wheezing likely mild Copd exacerbation ContAnoro Contalbuterol neb tid   LE edema (failed  outpatient diuresis) Hold lasix due to renal insufficiency cmp in am  Dm2/neuropathy fsbs ac and qhs iss  Hypothyroidism Cont levothyroxine  Anemia Check cbc in am  Abdominal  wound, 3x2cm pink granulation tissue Continue wound care  Code Status:DNR  Family Communication :w patient  Disposition Plan:home  Barriers For Discharge:  Consults :cardiology consult  Procedures :   DVT Prophylaxis: Heparin    Anti-infectives (From admission, onward)   None        Objective:   Vitals:   05/19/17 1725 05/19/17 1745 05/19/17 1820 05/19/17 1825  BP: (!) 161/31 (!) 128/28 (!) 116/56 (!) 111/36  Pulse: (!) 53 (!) 51 70 65  Resp: 17 12 18 15   Temp:      TempSrc:      SpO2: 100% 98% 97% 96%  Weight:        Wt Readings from Last 3 Encounters:  05/19/17 124.9 kg (275 lb 5.7 oz)  02/02/17 133.4 kg (294 lb 1.5 oz)  01/26/17 (!) 141.1 kg (311 lb)     Intake/Output Summary (Last 24 hours) at 05/19/2017 1929 Last data filed at 05/19/2017 1500 Gross per 24 hour  Intake 3 ml  Output 2250 ml  Net -2247 ml     Physical Exam  Awake Alert, Oriented X 3, No new F.N deficits, Normal affect [place].AT,PERRAL Supple Neck,No JVD, No cervical lymphadenopathy appriciated.  Symmetrical Chest wall movement, Good air movement bilaterally, CTAB RRR,No Gallops,Rubs or new Murmurs, No Parasternal Heave +ve B.Sounds, Abd Soft, No tenderness, No organomegaly appriciated, No rebound - guarding or rigidity. No Cyanosis, Clubbing or edema, No new Rash or bruise     2x3cm oval wound mid lower abdomen     Data Review:    CBC Recent Labs  Lab 05/15/17 1837 05/17/17 0602 05/18/17 0618 05/19/17 0622  WBC 7.2 6.5 6.7 5.9  HGB 11.4* 10.5* 11.8* 10.7*  HCT 38.0 34.6* 39.1 35.0*  PLT 333 272 325 281  MCV 85.2 85.9 86.7 86.2  MCH 25.6* 26.1 26.2 26.4  MCHC 30.0 30.3 30.2 30.6  RDW 15.9* 15.9* 15.8* 15.6*    Chemistries  Recent Labs  Lab 05/15/17 1837  05/17/17 0602 05/18/17 0618 05/19/17 0622  NA 137 137 135 135  K 4.0 4.2 4.5 4.1  CL 101 101 97* 102  CO2 29 28 29 27   GLUCOSE 179* 210* 199* 176*  BUN 29* 28* 30* 25*  CREATININE 1.21* 1.44* 1.66* 1.06*  CALCIUM 9.0 8.7* 9.2 8.8*  AST 16 11* 15 13*  ALT 11* 8* 10* 10*  ALKPHOS 82 69 82 71  BILITOT 0.5 0.5 0.5 0.5   ------------------------------------------------------------------------------------------------------------------ Recent Labs    05/19/17 0622  CHOL 164  HDL 36*  LDLCALC 107*  TRIG 104  CHOLHDL 4.6    Lab Results  Component Value Date   HGBA1C 7.7 (H) 05/15/2017   ------------------------------------------------------------------------------------------------------------------ No results for input(s): TSH, T4TOTAL, T3FREE, THYROIDAB in the last 72 hours.  Invalid input(s): FREET3 ------------------------------------------------------------------------------------------------------------------ No results for input(s): VITAMINB12, FOLATE, FERRITIN, TIBC, IRON, RETICCTPCT in the last 72 hours.  Coagulation profile Recent Labs  Lab 05/16/17 0517  INR 0.99    No results for input(s): DDIMER in the last 72 hours.  Cardiac Enzymes Recent Labs  Lab 05/15/17 1837 05/16/17 0011 05/16/17 0517  TROPONINI <0.03 <0.03 <0.03   ------------------------------------------------------------------------------------------------------------------    Component Value Date/Time   BNP 108.0 (H) 05/01/2017 1000    Inpatient Medications  Scheduled Meds: . albuterol  2.5 mg Nebulization TID  . aspirin EC  81 mg Oral Daily  . atorvastatin  80 mg Oral q1800  . cholecalciferol  5,000 Units Oral Daily  . diltiazem  300 mg  Oral Daily  . famotidine  20 mg Oral QHS  . feeding supplement (PRO-STAT SUGAR FREE 64)  30 mL Oral BID  . ferrous sulfate  325 mg Oral Q breakfast  . gabapentin  300 mg Oral TID  . Gerhardt's butt cream   Topical TID  . insulin aspart  0-5  Units Subcutaneous QHS  . insulin aspart  0-9 Units Subcutaneous TID WC  . insulin detemir  50 Units Subcutaneous QHS  . levothyroxine  224 mcg Oral QAC breakfast  . liraglutide  1.2 mg Subcutaneous Daily  . nystatin   Topical BID  . pantoprazole  40 mg Oral Daily  . sodium chloride flush  3 mL Intravenous Q12H  . topiramate  75 mg Oral Daily  . umeclidinium-vilanterol  1 puff Inhalation Daily   Continuous Infusions: . sodium chloride    . sodium chloride 100 mL/hr at 05/19/17 1701  . [START ON 05/20/2017] heparin     PRN Meds:.sodium chloride, acetaminophen **OR** acetaminophen, albuterol, ALPRAZolam, ondansetron, oxyCODONE-acetaminophen **AND** oxyCODONE, sodium chloride flush, tiZANidine  Micro Results Recent Results (from the past 240 hour(s))  Culture, Urine     Status: None   Collection Time: 05/15/17  9:19 PM  Result Value Ref Range Status   Specimen Description URINE, CLEAN CATCH  Final   Special Requests NONE  Final   Culture   Final    NO GROWTH Performed at [facility], [place] [address], [place], [place] [address]    Report Status 05/17/2017 FINAL  Final    Radiology Reports Nm Myocar Multi W/spect W/wall Motion / Ef  Result Date: 05/17/2017  There was no ST segment deviation noted during stress.  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 Prior small basal septal infarct.  This is an intermediate risk study. Borderline TID of 1.29 may suggest higher risk and balanced ischemia  The left ventricular ejection fraction is normal (55-65%).    Dg Chest Portable 1 View  Result Date: 05/16/2017 CLINICAL DATA:  Chest pain EXAM: PORTABLE CHEST 1 VIEW COMPARISON:  01/30/2017 FINDINGS: Patient rotation limits evaluation. Heart size and pulmonary vascularity are normal. Linear atelectasis in the lung bases, slightly more pronounced than on previous study. No developing consolidation or airspace disease.  No pneumothorax. IMPRESSION: Shallow inspiration with linear atelectasis in the lung bases. Electronically Signed   By: Lucienne Capers M.D.   On: 05/16/2017 00:59    Time Spent in minutes  30   [name] M.D on 05/19/2017 at 7:29 PM  Between 7am to 7am - Pager - [phone]

## 2017-05-19 NOTE — Progress Notes (Signed)
ANTICOAGULATION CONSULT NOTE - Initial Consult  Pharmacy Consult:  Heparin Indication: chest pain/ACS  Allergies  Allergen Reactions  . Dilaudid [Hydromorphone Hcl] Itching  . Actifed Cold-Allergy [Chlorpheniramine-Phenylephrine]     "I was sick and red and it didn't agree with me at all"  . Doxycycline Nausea And Vomiting  . Other Cough    Pt states she is allergic to ragweed and that she starts coughing and sneezing like crazy  . Penicillins Hives and Itching    Tolerates Rocephin Has patient had a PCN reaction causing immediate rash, facial/tongue/throat swelling, SOB or lightheadedness with hypotension: Yes Has patient had a PCN reaction causing severe rash involving mucus membranes or skin necrosis: No Has patient had a PCN reaction that required hospitalization Yes Has patient had a PCN reaction occurring within the last 10 years: No If all of the above answers are "NO", then may proceed with Cephalosporin use.   . Reglan [Metoclopramide] Itching  . Valium [Diazepam] Itching  . Vistaril [Hydroxyzine Hcl] Itching    Patient Measurements: Weight: 275 lb 5.7 oz (124.9 kg) Heparin Dosing Weight: 89 kg  Vital Signs: Temp: 97.5 F (36.4 C) (01/08 1311) Temp Source: Oral (01/08 1311) BP: 161/31 (01/08 1725) Pulse Rate: 53 (01/08 1725)  Labs: Recent Labs    05/17/17 0602 05/18/17 0618 05/19/17 0622  HGB 10.5* 11.8* 10.7*  HCT 34.6* 39.1 35.0*  PLT 272 325 281  CREATININE 1.44* 1.66* 1.06*    Estimated Creatinine Clearance: 69.5 mL/min (A) (by C-G formula based on SCr of 1.06 mg/dL (H)).   Medical History: Past Medical History:  Diagnosis Date  . Anxiety   . CHF (congestive heart failure) (HCC)    Diastolic  . Chronic back pain   . COPD (chronic obstructive pulmonary disease) ([place])   . Degenerative disk disease   . Diabetes mellitus without complication ([place])   . Hypertension   . Hypothyroidism   . On home O2    2.5 L N/C prn  . Pedal edema        Assessment: 61 YOF [facility] with [facility], then transferred to [facility] for cardiac cath.  Pharmacy consulted to start IV heparin 8 hours post sheath removal.  Sheath removed around 1625 per procedural log.  No bleeding nor hematoma documented.  Per documentation, patient is not a good candidate for bypass surgery; possible bifurcation PCI.   Goal of Therapy:  Heparin level 0.3-0.7 units/ml Monitor platelets by anticoagulation protocol: Yes    Plan:  On 05/20/17 at [place], start heparin gtt at 1250 units/hr, no bolus post cath Check 6 hr heparin level Daily heparin level and CBC Monitor for s/sx of bleeding, hematoma   Araminta Zorn D. Mina Marble, PharmD, BCPS Pager:  [phone] 05/19/2017, 5:35 PM

## 2017-05-19 NOTE — Progress Notes (Signed)
Inpatient Diabetes Program Recommendations  AACE/ADA: New Consensus Statement on Inpatient Glycemic Control (2015)  Target Ranges:  Prepandial:   less than 140 mg/dL      Peak postprandial:   less than 180 mg/dL (1-2 hours)      Critically ill patients:  140 - 180 mg/dL   Lab Results  Component Value Date   GLUCAP 173 (H) 05/19/2017   HGBA1C 7.7 (H) 05/15/2017    Review of Glycemic Control  Post-prandials > 180 mg/dL May benefit from addition of meal coverage insulin.  Inpatient Diabetes Program Recommendations:   Add Novolog 3 units tidwc for meal coverage insulin if pt eats > 50% meal.  Thank you. Lorenda Peck, RD, LDN, CDE Inpatient Diabetes Coordinator [phone]

## 2017-05-20 ENCOUNTER — Encounter (HOSPITAL_COMMUNITY): Payer: Self-pay | Admitting: Cardiology

## 2017-05-20 DIAGNOSIS — I208 Other forms of angina pectoris: Secondary | ICD-10-CM

## 2017-05-20 DIAGNOSIS — R9439 Abnormal result of other cardiovascular function study: Secondary | ICD-10-CM

## 2017-05-20 LAB — HEPARIN LEVEL (UNFRACTIONATED)
Heparin Unfractionated: 0.28 IU/mL — ABNORMAL LOW (ref 0.30–0.70)
Heparin Unfractionated: 0.5 IU/mL (ref 0.30–0.70)

## 2017-05-20 LAB — CBC
HCT: 35.7 % — ABNORMAL LOW (ref 36.0–46.0)
Hemoglobin: 10.6 g/dL — ABNORMAL LOW (ref 12.0–15.0)
MCH: 25.5 pg — ABNORMAL LOW (ref 26.0–34.0)
MCHC: 29.7 g/dL — ABNORMAL LOW (ref 30.0–36.0)
MCV: 86 fL (ref 78.0–100.0)
Platelets: 262 10*3/uL (ref 150–400)
RBC: 4.15 MIL/uL (ref 3.87–5.11)
RDW: 15.5 % (ref 11.5–15.5)
WBC: 6 10*3/uL (ref 4.0–10.5)

## 2017-05-20 LAB — COMPREHENSIVE METABOLIC PANEL
ALT: 10 U/L — ABNORMAL LOW (ref 14–54)
AST: 16 U/L (ref 15–41)
Albumin: 2.9 g/dL — ABNORMAL LOW (ref 3.5–5.0)
Alkaline Phosphatase: 74 U/L (ref 38–126)
Anion gap: 7 (ref 5–15)
BUN: 19 mg/dL (ref 6–20)
CO2: 27 mmol/L (ref 22–32)
Calcium: 8.8 mg/dL — ABNORMAL LOW (ref 8.9–10.3)
Chloride: 101 mmol/L (ref 101–111)
Creatinine, Ser: 1.05 mg/dL — ABNORMAL HIGH (ref 0.44–1.00)
GFR calc Af Amer: >60 mL/min (ref >60)
GFR calc non Af Amer: 54 mL/min — ABNORMAL LOW (ref >60)
Glucose, Bld: 178 mg/dL — ABNORMAL HIGH (ref 65–99)
Potassium: 3.9 mmol/L (ref 3.5–5.1)
Sodium: 135 mmol/L (ref 135–145)
Total Bilirubin: 0.5 mg/dL (ref 0.3–1.2)
Total Protein: 6 g/dL — ABNORMAL LOW (ref 6.5–8.1)

## 2017-05-20 LAB — GLUCOSE, CAPILLARY
Glucose-Capillary: 211 mg/dL — ABNORMAL HIGH (ref 65–99)
Glucose-Capillary: 146 mg/dL — ABNORMAL HIGH (ref 65–99)
Glucose-Capillary: 204 mg/dL — ABNORMAL HIGH (ref 65–99)
Glucose-Capillary: 251 mg/dL — ABNORMAL HIGH (ref 65–99)

## 2017-05-20 LAB — MRSA PCR SCREENING: MRSA by PCR: POSITIVE — AB

## 2017-05-20 MED ORDER — ISOSORBIDE MONONITRATE ER 30 MG PO TB24
30.0000 mg | ORAL_TABLET | Freq: Every day | ORAL | Status: DC
  Administered 2017-05-20: 30 mg via ORAL
  Filled 2017-05-20: qty 1

## 2017-05-20 MED ORDER — MUPIROCIN 2 % EX OINT
1.0000 "application " | TOPICAL_OINTMENT | Freq: Two times a day (BID) | CUTANEOUS | Status: AC
  Administered 2017-05-20 – 2017-05-24 (×9): 1 via NASAL
  Filled 2017-05-20 (×2): qty 22

## 2017-05-20 MED ORDER — GABAPENTIN 300 MG PO CAPS
300.0000 mg | ORAL_CAPSULE | Freq: Once | ORAL | Status: AC
  Administered 2017-05-20: 300 mg via ORAL
  Filled 2017-05-20: qty 1

## 2017-05-20 MED ORDER — CHLORHEXIDINE GLUCONATE CLOTH 2 % EX PADS
6.0000 | MEDICATED_PAD | Freq: Every day | CUTANEOUS | Status: AC
  Administered 2017-05-20 – 2017-05-21 (×2): 6 via TOPICAL

## 2017-05-20 NOTE — Care Management Note (Signed)
Case Management Note  Patient Details  Name: Stephanie Combs MRN: [id number] Date of Birth: 10-Apr-1951  Subjective/Objective:                  Pt admitted with CHF and edema  Action/Plan: PTA from home, pt is active with [facility] for [facility] to assist with wound care - agency will resume services at discharge - CM requested resumption order.  Pt is on home oxygen supplied by [facility]    Expected Discharge Date:  (unknown)               Expected Discharge Plan:  [employer]  In-House Referral:     Discharge planning Services  CM Consult  Post Acute Care Choice:  Resumption of Svcs/PTA Provider Choice offered to:     DME Arranged:    DME Agency:     HH Arranged:  [facility] [place] Agency:  Nanine Means)  Status of Service:  In process, will continue to follow  If discussed at Long Length of Stay Meetings, dates discussed:    Additional Comments:  Maryclare Labrador, RN 05/20/2017, 3:56 PM

## 2017-05-20 NOTE — Progress Notes (Signed)
Progress Note  Patient Name: Stephanie Combs Date of Encounter: 05/20/2017  Primary Cardiologist: Carlyle Dolly, MD   Subjective   No dyspnea; CP last PM  Inpatient Medications    Scheduled Meds: . albuterol  2.5 mg Nebulization TID  . aspirin EC  81 mg Oral Daily  . atorvastatin  80 mg Oral q1800  . cholecalciferol  5,000 Units Oral Daily  . diltiazem  300 mg Oral Daily  . famotidine  20 mg Oral QHS  . feeding supplement (PRO-STAT SUGAR FREE 64)  30 mL Oral BID  . ferrous sulfate  325 mg Oral Q breakfast  . gabapentin  300 mg Oral TID  . Gerhardt's butt cream   Topical TID  . insulin aspart  0-5 Units Subcutaneous QHS  . insulin aspart  0-9 Units Subcutaneous TID WC  . insulin detemir  50 Units Subcutaneous QHS  . levothyroxine  224 mcg Oral QAC breakfast  . liraglutide  1.2 mg Subcutaneous Daily  . nystatin   Topical BID  . pantoprazole  40 mg Oral Daily  . sodium chloride flush  3 mL Intravenous Q12H  . sodium chloride flush  3 mL Intravenous Q12H  . topiramate  75 mg Oral Daily  . umeclidinium-vilanterol  1 puff Inhalation Daily   Continuous Infusions: . sodium chloride 250 mL (05/20/17 0607)  . heparin 1,250 Units/hr (05/20/17 0300)   PRN Meds: sodium chloride, acetaminophen **OR** acetaminophen, albuterol, ALPRAZolam, ondansetron (ZOFRAN) IV, ondansetron, oxyCODONE-acetaminophen **AND** oxyCODONE, sodium chloride flush, sodium chloride flush, tiZANidine   Vital Signs    Vitals:   05/19/17 2328 05/20/17 0030 05/20/17 0300 05/20/17 0516  BP: 134/72   113/82  Pulse: 77 (!) 55 (!) 49 (!) 51  Resp: 16 18 15 17   Temp: 98.5 F (36.9 C)   98.3 F (36.8 C)  TempSrc: Oral   Oral  SpO2: 98% 99% 97% 99%  Weight:    283 lb 8.2 oz (128.6 kg)    Intake/Output Summary (Last 24 hours) at 05/20/2017 0731 Last data filed at 05/20/2017 0519 Gross per 24 hour  Intake 629.58 ml  Output 2450 ml  Net -1820.42 ml   Filed Weights   05/18/17 0451 05/19/17 0615 05/20/17  0516  Weight: 283 lb 1.1 oz (128.4 kg) 275 lb 5.7 oz (124.9 kg) 283 lb 8.2 oz (128.6 kg)    Telemetry    Sinus - Personally Reviewed  Physical Exam   GEN: WD, morbidly obese NAD Neck: No JVD, supple Cardiac: RRR Respiratory: Clear to auscultation bilaterally; no wheeze GI: Soft, obese, chronic ulcer MS: No edema; radial cath site with no hematoma Neuro:  grossly intact   Labs    Chemistry Recent Labs  Lab 05/17/17 0602 05/18/17 0618 05/19/17 0622  NA 137 135 135  K 4.2 4.5 4.1  CL 101 97* 102  CO2 28 29 27   GLUCOSE 210* 199* 176*  BUN 28* 30* 25*  CREATININE 1.44* 1.66* 1.06*  CALCIUM 8.7* 9.2 8.8*  PROT 5.9* 7.2 5.8*  ALBUMIN 3.0* 3.7 3.0*  AST 11* 15 13*  ALT 8* 10* 10*  ALKPHOS 69 82 71  BILITOT 0.5 0.5 0.5  GFRNONAA 37* 31* 53*  GFRAA 42* 36* >60  ANIONGAP 8 9 6      Hematology Recent Labs  Lab 05/17/17 0602 05/18/17 0618 05/19/17 0622  WBC 6.5 6.7 5.9  RBC 4.03 4.51 4.06  HGB 10.5* 11.8* 10.7*  HCT 34.6* 39.1 35.0*  MCV 85.9 86.7 86.2  MCH  26.1 26.2 26.4  MCHC 30.3 30.2 30.6  RDW 15.9* 15.8* 15.6*  PLT 272 325 281    Cardiac Enzymes Recent Labs  Lab 05/15/17 1837 05/16/17 0011 05/16/17 0517  TROPONINI <0.03 <0.03 <0.03    Patient Profile     67 year old female with past medical history of chronic diastolic congestive heart failure, diabetes mellitus, hypertension, hyperlipidemia, home O2 dependent COPD for evaluation of chest pain and abnormal nuclear study. Enzymes negative. Nuclear study shows normal LV function and prior apical infarct with peri-infarct ischemia. Echocardiogram shows normal LV function, biatrial enlargement and mild right ventricular enlargement.  Cardiac catheterization reveals ostial 65 LAD, 70 followed by 65 prox LAD and first diagonal of 80%.    Assessment & Plan    1 chest pain-cardiac catheterization reveals LAD and diagonal disease.  Dr. Ellyn Hack will review films with interventional colleagues and make  decision concerning PCI.  This will likely be performed tomorrow.  Note she has home O2 dependent COPD and limited functional capacity.  Doubt she would not be a candidate for coronary artery bypass and graft and pt states she would not be agreeable to surgery.  Continue aspirin, Cardizem, heparin and statin. Add imdur 30 mg daily.  2 acute on chronic diastolic congestive heart failure-diuretics on hold for possible PCI in AM. Will resume follow procedure when renal function stable.   3 acute on chronic stage III kidney disease-diuretics on hold; recheck BMET today and in AM.  4 hypertension-blood pressure is controlled.  Continue present medications.  5 home O2 dependent COPD    For questions or updates, please contact [place] Please consult [email or web address] for contact info under Cardiology/STEMI.      Signed, Kirk Ruths, MD  05/20/2017, 7:31 AM

## 2017-05-20 NOTE — Progress Notes (Signed)
ANTICOAGULATION CONSULT NOTE - Follow Up Consult  Pharmacy Consult for Heparin Indication: chest pain/ACS  Allergies  Allergen Reactions  . Dilaudid [Hydromorphone Hcl] Itching  . Actifed Cold-Allergy [Chlorpheniramine-Phenylephrine]     "I was sick and red and it didn't agree with me at all"  . Doxycycline Nausea And Vomiting  . Other Cough    Pt states she is allergic to ragweed and that she starts coughing and sneezing like crazy  . Penicillins Hives and Itching    Tolerates Rocephin Has patient had a PCN reaction causing immediate rash, facial/tongue/throat swelling, SOB or lightheadedness with hypotension: Yes Has patient had a PCN reaction causing severe rash involving mucus membranes or skin necrosis: No Has patient had a PCN reaction that required hospitalization Yes Has patient had a PCN reaction occurring within the last 10 years: No If all of the above answers are "NO", then may proceed with Cephalosporin use.   . Reglan [Metoclopramide] Itching  . Valium [Diazepam] Itching  . Vistaril [Hydroxyzine Hcl] Itching    Patient Measurements: Height: 5\' 6"  (167.6 cm) Weight: 283 lb 8.2 oz (128.6 kg) IBW/kg (Calculated) : 59.3 Heparin Dosing Weight: 90 kg  Vital Signs: Temp: 98.3 F (36.8 C) (01/09 1710) Temp Source: Oral (01/09 1710) BP: 147/73 (01/09 1710) Pulse Rate: 63 (01/09 1710)  Labs: Recent Labs    05/18/17 0618 05/19/17 0622 05/20/17 0853 05/20/17 1747  HGB 11.8* 10.7* 10.6*  --   HCT 39.1 35.0* 35.7*  --   PLT 325 281 262  --   HEPARINUNFRC  --   --  0.28* 0.50  CREATININE 1.66* 1.06* 1.05*  --     Estimated Creatinine Clearance: 71.4 mL/min (A) (by C-G formula based on SCr of 1.05 mg/dL (H)).  Assessment: 67 yo female with CP s/p cath and possible PCI on 1/10. Pharmacy consulted to dose heparin  -Heparin level is at goal after increase to 1400 units/hr  Goal of Therapy:  Heparin level 0.3-0.7 units/ml Monitor platelets by anticoagulation  protocol: Yes   Plan:  -No heparin changes needed -Daily heparin level and CBC  Hildred Laser, Pharm D 05/20/2017 7:02 PM

## 2017-05-20 NOTE — Progress Notes (Signed)
ANTICOAGULATION CONSULT NOTE - Follow Up Consult  Pharmacy Consult for Heparin Indication: chest pain/ACS  Allergies  Allergen Reactions  . Dilaudid [Hydromorphone Hcl] Itching  . Actifed Cold-Allergy [Chlorpheniramine-Phenylephrine]     "I was sick and red and it didn't agree with me at all"  . Doxycycline Nausea And Vomiting  . Other Cough    Pt states she is allergic to ragweed and that she starts coughing and sneezing like crazy  . Penicillins Hives and Itching    Tolerates Rocephin Has patient had a PCN reaction causing immediate rash, facial/tongue/throat swelling, SOB or lightheadedness with hypotension: Yes Has patient had a PCN reaction causing severe rash involving mucus membranes or skin necrosis: No Has patient had a PCN reaction that required hospitalization Yes Has patient had a PCN reaction occurring within the last 10 years: No If all of the above answers are "NO", then may proceed with Cephalosporin use.   . Reglan [Metoclopramide] Itching  . Valium [Diazepam] Itching  . Vistaril [Hydroxyzine Hcl] Itching    Patient Measurements: Height: 5\' 6"  (167.6 cm) Weight: 283 lb 8.2 oz (128.6 kg) IBW/kg (Calculated) : 59.3 Heparin Dosing Weight: 90 kg  Vital Signs: Temp: 98.3 F (36.8 C) (01/09 0516) Temp Source: Oral (01/09 0516) BP: 113/82 (01/09 0516) Pulse Rate: 51 (01/09 0516)  Labs: Recent Labs    05/18/17 0618 05/19/17 0622 05/20/17 0853  HGB 11.8* 10.7* 10.6*  HCT 39.1 35.0* 35.7*  PLT 325 281 262  HEPARINUNFRC  --   --  0.28*  CREATININE 1.66* 1.06* 1.05*    Estimated Creatinine Clearance: 71.4 mL/min (A) (by C-G formula based on SCr of 1.05 mg/dL (H)).  Assessment:    Initial heparin level is 0.28 on 1250 units/hr.  Just below target range.    Possible PCI on 1/10.  Goal of Therapy:  Heparin level 0.3-0.7 units/ml Monitor platelets by anticoagulation protocol: Yes   Plan:   Increase heparin drip to 1400 units/hr  Heparin level ~6 hrs  after rate change.  Daily heparin level and CBC while on heparin.  Follow up plans.  Arty Baumgartner, [place] Pager: [phone] 05/20/2017,11:56 AM

## 2017-05-20 NOTE — Progress Notes (Signed)
Patient ID: Stephanie Combs, female   DOB: Jun 02, 1950, 67 y.o.   MRN: [id number]                                                                PROGRESS NOTE                                                                                                                                                                                                             Patient Demographics:    Stephanie Combs, is a 67 y.o. female, DOB - 1951-01-09, VOZ:[id number]  Admit date - 05/15/2017   Admitting Physician [name], MD  Outpatient Primary MD for the patient is [name], MD  LOS - 5  Outpatient Specialists: Dr. Norberto Sorenson  No chief complaint on file.      Brief Narrative 67 y.o.female,w hypothyroidism, hypertension, CHF (EF 65%), Copd Dm2 w neuropathy apparently c/o increase in bilateral lower ext edema, + weight gain , dyspnea, orthopnea, as well as chest pain "burning" and squeezing and radiation to the left arm for the past 2 week. Intermittent. Last had chest pain last nite. Pt requested direct admission for chest pain and dyspnea and lower ext edema.       Subjective:    [name] today no chest pain, no dyspnea.  Pt denies any bleeding.  Breathing stable.     Assessment  & Plan :    Active Problems:   DM type 2 (diabetes mellitus, type 2) (HCC)   Dyspnea   Edema   Abnormal nuclear stress test   Atypical angina (HCC)   Chest pain, nuclear stress test =>intermediate risk S/p Cardiac cath 05/19/2017=>LAD lesions  Cont aspirin Cont Lipitor 80mg  po qhs Cont Cardizem  Heparin Iv per pharmacy  Cardiology consult, appreciated  Dyspnea/ wheezing likely mild Copd exacerbation ContAnoro Contalbuterol neb tid   LE edema (failed outpatient diuresis), CHF (diastolic) Hold lasix due to renal insufficiency cmp in am  Dm2/neuropathy fsbs ac and qhs iss  Hypothyroidism Cont levothyroxine  Anemia Check cbc in am  Abdominal wound, 3x2cm  pink granulation tissue Continue wound care  Code Status:DNR  Family Communication :w patient  Disposition Plan:home  Barriers For Discharge:  Consults :cardiology consult  Procedures :   DVT Prophylaxis: Heparin  Anti-infectives (From admission, onward)   None        RecentLabs       Lab Results  Component Value Date   PLT 325 05/18/2017      Antibiotics  :  none     Anti-infectives (From admission, onward)   None          RecentLabs       Lab Results  Component Value Date   PLT 325 05/18/2017      Antibiotics  :  none     Anti-infectives (From admission, onward)   None      Anti-infectives (From admission, onward)   None        Objective:   Vitals:   05/20/17 0300 05/20/17 0516 05/20/17 1100 05/20/17 1213  BP:  113/82  (!) 155/67  Pulse: (!) 49 (!) 51    Resp: 15 17  17   Temp:  98.3 F (36.8 C)  98.5 F (36.9 C)  TempSrc:  Oral  Oral  SpO2: 97% 99%    Weight:  128.6 kg (283 lb 8.2 oz)    Height:   5\' 6"  (1.676 m)     Wt Readings from Last 3 Encounters:  05/20/17 128.6 kg (283 lb 8.2 oz)  02/02/17 133.4 kg (294 lb 1.5 oz)  01/26/17 (!) 141.1 kg (311 lb)     Intake/Output Summary (Last 24 hours) at 05/20/2017 1307 Last data filed at 05/20/2017 1234 Gross per 24 hour  Intake 739.58 ml  Output 2400 ml  Net -1660.42 ml     Physical Exam  Awake Alert, Oriented X 3, No new F.N deficits, Normal affect Williams.AT,PERRAL Supple Neck,No JVD, No cervical lymphadenopathy appriciated.  Symmetrical Chest wall movement, Good air movement bilaterally, CTAB RRR,No Gallops,Rubs or new Murmurs, No Parasternal Heave +ve B.Sounds, Abd Soft, No tenderness, No organomegaly appriciated, No rebound - guarding or rigidity. No Cyanosis, Clubbing or edema, No new Rash or bruise      2x3cm oval wound mid lower abdomen      Data Review:    CBC Recent Labs  Lab 05/15/17 1837  05/17/17 0602 05/18/17 0618 05/19/17 0622 05/20/17 0853  WBC 7.2 6.5 6.7 5.9 6.0  HGB 11.4* 10.5* 11.8* 10.7* 10.6*  HCT 38.0 34.6* 39.1 35.0* 35.7*  PLT 333 272 325 281 262  MCV 85.2 85.9 86.7 86.2 86.0  MCH 25.6* 26.1 26.2 26.4 25.5*  MCHC 30.0 30.3 30.2 30.6 29.7*  RDW 15.9* 15.9* 15.8* 15.6* 15.5    Chemistries  Recent Labs  Lab 05/15/17 1837 05/17/17 0602 05/18/17 0618 05/19/17 0622 05/20/17 0853  NA 137 137 135 135 135  K 4.0 4.2 4.5 4.1 3.9  CL 101 101 97* 102 101  CO2 29 28 29 27 27   GLUCOSE 179* 210* 199* 176* 178*  BUN 29* 28* 30* 25* 19  CREATININE 1.21* 1.44* 1.66* 1.06* 1.05*  CALCIUM 9.0 8.7* 9.2 8.8* 8.8*  AST 16 11* 15 13* 16  ALT 11* 8* 10* 10* 10*  ALKPHOS 82 69 82 71 74  BILITOT 0.5 0.5 0.5 0.5 0.5   ------------------------------------------------------------------------------------------------------------------ Recent Labs    05/19/17 0622  CHOL 164  HDL 36*  LDLCALC 107*  TRIG 104  CHOLHDL 4.6    Lab Results  Component Value Date   HGBA1C 7.7 (H) 05/15/2017   ------------------------------------------------------------------------------------------------------------------ No results for input(s): TSH, T4TOTAL, T3FREE, THYROIDAB in the last 72 hours.  Invalid input(s): FREET3 ------------------------------------------------------------------------------------------------------------------ No results for input(s): VITAMINB12, FOLATE, FERRITIN, TIBC, IRON,  RETICCTPCT in the last 72 hours.  Coagulation profile Recent Labs  Lab 05/16/17 0517  INR 0.99    No results for input(s): DDIMER in the last 72 hours.  Cardiac Enzymes Recent Labs  Lab 05/15/17 1837 05/16/17 0011 05/16/17 0517  TROPONINI <0.03 <0.03 <0.03   ------------------------------------------------------------------------------------------------------------------    Component Value Date/Time   BNP 108.0 (H) 05/01/2017 1000    Inpatient  Medications  Scheduled Meds: . albuterol  2.5 mg Nebulization TID  . aspirin EC  81 mg Oral Daily  . atorvastatin  80 mg Oral q1800  . Chlorhexidine Gluconate Cloth  6 each Topical Q0600  . cholecalciferol  5,000 Units Oral Daily  . diltiazem  300 mg Oral Daily  . famotidine  20 mg Oral QHS  . feeding supplement (PRO-STAT SUGAR FREE 64)  30 mL Oral BID  . ferrous sulfate  325 mg Oral Q breakfast  . gabapentin  300 mg Oral TID  . Gerhardt's butt cream   Topical TID  . insulin aspart  0-5 Units Subcutaneous QHS  . insulin aspart  0-9 Units Subcutaneous TID WC  . insulin detemir  50 Units Subcutaneous QHS  . isosorbide mononitrate  30 mg Oral Daily  . levothyroxine  224 mcg Oral QAC breakfast  . mupirocin ointment  1 application Nasal BID  . nystatin   Topical BID  . pantoprazole  40 mg Oral Daily  . sodium chloride flush  3 mL Intravenous Q12H  . sodium chloride flush  3 mL Intravenous Q12H  . topiramate  75 mg Oral Daily  . umeclidinium-vilanterol  1 puff Inhalation Daily   Continuous Infusions: . sodium chloride 250 mL (05/20/17 0607)  . heparin 1,400 Units/hr (05/20/17 1205)   PRN Meds:.sodium chloride, acetaminophen **OR** acetaminophen, albuterol, ALPRAZolam, ondansetron (ZOFRAN) IV, ondansetron, oxyCODONE-acetaminophen **AND** oxyCODONE, sodium chloride flush, sodium chloride flush, tiZANidine  Micro Results Recent Results (from the past 240 hour(s))  Culture, Urine     Status: None   Collection Time: 05/15/17  9:19 PM  Result Value Ref Range Status   Specimen Description URINE, CLEAN CATCH  Final   Special Requests NONE  Final   Culture   Final    NO GROWTH Performed at [facility], [place] [address] [place], [place] [address]    Report Status 05/17/2017 FINAL  Final  MRSA PCR Screening     Status: Abnormal   Collection Time: 05/20/17  4:20 AM  Result Value Ref Range Status   MRSA by PCR POSITIVE (A) NEGATIVE Final    Comment:        The GeneXpert MRSA  Assay (FDA approved for NASAL specimens only), is one component of a comprehensive MRSA colonization surveillance program. It is not intended to diagnose MRSA infection nor to guide or monitor treatment for MRSA infections. RESULT CALLED TO, READ BACK BY AND VERIFIED WITH: Verdene Rio RN 9:50 05/20/17 ([email or web address])     Radiology Reports Nm Myocar Multi W/spect W/wall Motion / Ef  Result Date: 05/17/2017  There was no ST segment deviation noted during stress.  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 Prior small basal septal infarct.  This is an intermediate risk study. Borderline TID of 1.29 may suggest higher risk and balanced ischemia  The left ventricular ejection fraction is normal (55-65%).    Dg Chest Portable 1 View  Result Date: 05/16/2017 CLINICAL DATA:  Chest pain EXAM: PORTABLE CHEST 1 VIEW COMPARISON:  01/30/2017 FINDINGS:  Patient rotation limits evaluation. Heart size and pulmonary vascularity are normal. Linear atelectasis in the lung bases, slightly more pronounced than on previous study. No developing consolidation or airspace disease. No pneumothorax. IMPRESSION: Shallow inspiration with linear atelectasis in the lung bases. Electronically Signed   By: Lucienne Capers M.D.   On: 05/16/2017 00:59    Time Spent in minutes  30   [name] M.D on 05/20/2017 at 1:07 PM  Between 7am to [place] - Pager - [phone]

## 2017-05-21 ENCOUNTER — Other Ambulatory Visit: Payer: Self-pay

## 2017-05-21 LAB — GLUCOSE, CAPILLARY
Glucose-Capillary: 159 mg/dL — ABNORMAL HIGH (ref 65–99)
Glucose-Capillary: 220 mg/dL — ABNORMAL HIGH (ref 65–99)
Glucose-Capillary: 229 mg/dL — ABNORMAL HIGH (ref 65–99)
Glucose-Capillary: 230 mg/dL — ABNORMAL HIGH (ref 65–99)

## 2017-05-21 LAB — CBC
HCT: 34.3 % — ABNORMAL LOW (ref 36.0–46.0)
Hemoglobin: 10.6 g/dL — ABNORMAL LOW (ref 12.0–15.0)
MCH: 26.7 pg (ref 26.0–34.0)
MCHC: 30.9 g/dL (ref 30.0–36.0)
MCV: 86.4 fL (ref 78.0–100.0)
Platelets: 255 10*3/uL (ref 150–400)
RBC: 3.97 MIL/uL (ref 3.87–5.11)
RDW: 15.9 % — ABNORMAL HIGH (ref 11.5–15.5)
WBC: 5.5 10*3/uL (ref 4.0–10.5)

## 2017-05-21 LAB — BASIC METABOLIC PANEL
Anion gap: 4 — ABNORMAL LOW (ref 5–15)
BUN: 24 mg/dL — ABNORMAL HIGH (ref 6–20)
CO2: 29 mmol/L (ref 22–32)
Calcium: 8.9 mg/dL (ref 8.9–10.3)
Chloride: 103 mmol/L (ref 101–111)
Creatinine, Ser: 1.37 mg/dL — ABNORMAL HIGH (ref 0.44–1.00)
GFR calc Af Amer: 45 mL/min — ABNORMAL LOW (ref >60)
GFR calc non Af Amer: 39 mL/min — ABNORMAL LOW (ref >60)
Glucose, Bld: 240 mg/dL — ABNORMAL HIGH (ref 65–99)
Potassium: 4.7 mmol/L (ref 3.5–5.1)
Sodium: 136 mmol/L (ref 135–145)

## 2017-05-21 LAB — HEPARIN LEVEL (UNFRACTIONATED): Heparin Unfractionated: 0.56 IU/mL (ref 0.30–0.70)

## 2017-05-21 MED ORDER — NITROGLYCERIN 0.4 MG SL SUBL
0.4000 mg | SUBLINGUAL_TABLET | Freq: Once | SUBLINGUAL | Status: AC
  Administered 2017-05-21: 0.4 mg via SUBLINGUAL
  Filled 2017-05-21: qty 1

## 2017-05-21 MED ORDER — METOPROLOL TARTRATE 25 MG PO TABS
25.0000 mg | ORAL_TABLET | Freq: Two times a day (BID) | ORAL | Status: DC
  Administered 2017-05-21 – 2017-05-26 (×7): 25 mg via ORAL
  Filled 2017-05-21 (×9): qty 1

## 2017-05-21 MED ORDER — HEPARIN SODIUM (PORCINE) 5000 UNIT/ML IJ SOLN
5000.0000 [IU] | Freq: Three times a day (TID) | INTRAMUSCULAR | Status: DC
  Administered 2017-05-21 – 2017-05-22 (×6): 5000 [IU] via SUBCUTANEOUS
  Filled 2017-05-21 (×6): qty 1

## 2017-05-21 MED ORDER — ISOSORBIDE MONONITRATE ER 60 MG PO TB24
60.0000 mg | ORAL_TABLET | Freq: Every day | ORAL | Status: DC
  Administered 2017-05-21 – 2017-05-22 (×2): 60 mg via ORAL
  Filled 2017-05-21 (×3): qty 1

## 2017-05-21 MED ORDER — FUROSEMIDE 10 MG/ML IJ SOLN: 20.0000 mg | Freq: Once | INTRAMUSCULAR | Status: DC

## 2017-05-21 NOTE — Progress Notes (Signed)
Cardiology paged, pt c/o chest pain that has been unrelieved since time of admission. Describes pain as tightness, burning sensation that radiates down left shoulder, rates 9/10. VSS. Verbal orders given for SL Nitro. Will administer, re-evaluate and continue to monitor.

## 2017-05-21 NOTE — H&P (Signed)
PROGRESS NOTE                                                                                                                                                                                                             Patient Demographics:    Stephanie Combs, is a 67 y.o. female, DOB - 04-30-1951, KPT:[id number]  Admit date - 05/15/2017   Admitting Physician [name], MD  Outpatient Primary MD for the patient is [name], MD  LOS - 6  Outpatient Specialists: Dr. Norberto Sorenson   No chief complaint on file.    Dyspnea, edema  Brief Narrative   67 y.o.female,w hypothyroidism, hypertension, CHF (EF 65%), Copd Dm2 w neuropathy apparently c/o increase in bilateral lower ext edema, + weight gain , dyspnea, orthopnea, as well as chest pain "burning" and squeezing and radiation to the left arm for the past 2 week. Intermittent. Last had chest pain last nite. Pt requested direct admission for chest pain and dyspnea and lower ext edema.       Subjective:    Jenin Birdsall today has no chest pain this am.  Pt currently on heparin awaiting cath vs CABG,  Cardiology following. No lasix this am due to renal insufficiency  No chest pain, No abdominal pain - No Nausea, No new weakness tingling or numbness, No Cough - SOB.    Assessment  & Plan :    Active Problems:   DM type 2 (diabetes mellitus, type 2) (HCC)   Dyspnea   Edema   Abnormal nuclear stress test   Atypical angina (HCC)     Chest pain, nuclear stress test =>intermediate risk S/p Cardiac cath 05/19/2017=>LAD lesions Cont aspirin Cont Lipitor 80mg  po qhs Cont Cardizem  Cont imdur Heparin Iv per pharmacy Cardiology consult, appreciated Awaiting cath vs CABG  Dyspnea/ wheezing likely mild Copd exacerbation ContAnoro Contalbuterol neb tid   LE edema (failed outpatient diuresis), CHF (diastolic) Hold lasix due to renal  insufficiency bmp in am  Dm2/neuropathy fsbs ac and qhs iss  Hypothyroidism Cont levothyroxine  Anemia Check cbc in am  Abdominal wound, 3x2cm pink granulation tissue Continue wound care  Code Status:DNR  Family  Communication :w patient  Disposition Plan:home  Barriers For Discharge:  Consults :cardiology consult  Procedures :   DVT Prophylaxis: Heparin       Anti-infectives(From admission, onward)   None        Lab Results  Component Value Date   PLT 255 05/21/2017    Antibiotics  :  none  Anti-infectives (From admission, onward)   None        Objective:   Vitals:   05/21/17 0014 05/21/17 0438 05/21/17 0528 05/21/17 0754  BP: 124/76 (!) 113/59  123/62  Pulse:    (!) 50  Resp: 17 (!) 27  12  Temp: 97.9 F (36.6 C) 98.2 F (36.8 C)  97.6 F (36.4 C)  TempSrc: Oral Oral  Oral  SpO2:      Weight:   128.6 kg (283 lb 8.2 oz)   Height:        Wt Readings from Last 3 Encounters:  05/21/17 128.6 kg (283 lb 8.2 oz)  02/02/17 133.4 kg (294 lb 1.5 oz)  01/26/17 (!) 141.1 kg (311 lb)     Intake/Output Summary (Last 24 hours) at 05/21/2017 0801 Last data filed at 05/20/2017 1400 Gross per 24 hour  Intake 220 ml  Output 550 ml  Net -330 ml     Physical Exam  Awake Alert, Oriented X 3, No new F.N deficits, Normal affect [place].AT,PERRAL Supple Neck,No JVD, No cervical lymphadenopathy appriciated.  Symmetrical Chest wall movement, Good air movement bilaterally, CTAB RRR,No Gallops,Rubs or new Murmurs, No Parasternal Heave +ve B.Sounds, Abd Soft, No tenderness, No organomegaly appriciated, No rebound - guarding or rigidity. No Cyanosis, Clubbing or edema, No new Rash or bruise      2x3cm oval wound mid lower abdomen      Data Review:    CBC Recent Labs  Lab 05/17/17 0602 05/18/17 0618 05/19/17 0622 05/20/17 0853 05/21/17 0309  WBC 6.5 6.7 5.9 6.0 5.5  HGB 10.5* 11.8* 10.7* 10.6* 10.6*   HCT 34.6* 39.1 35.0* 35.7* 34.3*  PLT 272 325 281 262 255  MCV 85.9 86.7 86.2 86.0 86.4  MCH 26.1 26.2 26.4 25.5* 26.7  MCHC 30.3 30.2 30.6 29.7* 30.9  RDW 15.9* 15.8* 15.6* 15.5 15.9*    Chemistries  Recent Labs  Lab 05/15/17 1837 05/17/17 0602 05/18/17 0618 05/19/17 0622 05/20/17 0853 05/21/17 0309  NA 137 137 135 135 135 136  K 4.0 4.2 4.5 4.1 3.9 4.7  CL 101 101 97* 102 101 103  CO2 29 28 29 27 27 29   GLUCOSE 179* 210* 199* 176* 178* 240*  BUN 29* 28* 30* 25* 19 24*  CREATININE 1.21* 1.44* 1.66* 1.06* 1.05* 1.37*  CALCIUM 9.0 8.7* 9.2 8.8* 8.8* 8.9  AST 16 11* 15 13* 16  --   ALT 11* 8* 10* 10* 10*  --   ALKPHOS 82 69 82 71 74  --   BILITOT 0.5 0.5 0.5 0.5 0.5  --    ------------------------------------------------------------------------------------------------------------------ Recent Labs    05/19/17 0622  CHOL 164  HDL 36*  LDLCALC 107*  TRIG 104  CHOLHDL 4.6    Lab Results  Component Value Date   HGBA1C 7.7 (H) 05/15/2017   ------------------------------------------------------------------------------------------------------------------ No results for input(s): TSH, T4TOTAL, T3FREE, THYROIDAB in the last 72 hours.  Invalid input(s): FREET3 ------------------------------------------------------------------------------------------------------------------ No results for input(s): VITAMINB12, FOLATE, FERRITIN, TIBC, IRON, RETICCTPCT in the last 72 hours.  Coagulation profile Recent Labs  Lab 05/16/17 0517  INR 0.99    No results  for input(s): DDIMER in the last 72 hours.  Cardiac Enzymes Recent Labs  Lab 05/15/17 1837 05/16/17 0011 05/16/17 0517  TROPONINI <0.03 <0.03 <0.03   ------------------------------------------------------------------------------------------------------------------    Component Value Date/Time   BNP 108.0 (H) 05/01/2017 1000    Inpatient Medications  Scheduled Meds: . albuterol  2.5 mg Nebulization TID  .  aspirin EC  81 mg Oral Daily  . atorvastatin  80 mg Oral q1800  . Chlorhexidine Gluconate Cloth  6 each Topical Q0600  . cholecalciferol  5,000 Units Oral Daily  . famotidine  20 mg Oral QHS  . feeding supplement (PRO-STAT SUGAR FREE 64)  30 mL Oral BID  . ferrous sulfate  325 mg Oral Q breakfast  . furosemide  20 mg Intravenous Once  . gabapentin  300 mg Oral TID  . Gerhardt's butt cream   Topical TID  . heparin injection (subcutaneous)  5,000 Units Subcutaneous Q8H  . insulin aspart  0-5 Units Subcutaneous QHS  . insulin aspart  0-9 Units Subcutaneous TID WC  . insulin detemir  50 Units Subcutaneous QHS  . isosorbide mononitrate  60 mg Oral Daily  . levothyroxine  224 mcg Oral QAC breakfast  . metoprolol tartrate  25 mg Oral BID  . mupirocin ointment  1 application Nasal BID  . nystatin   Topical BID  . pantoprazole  40 mg Oral Daily  . sodium chloride flush  3 mL Intravenous Q12H  . sodium chloride flush  3 mL Intravenous Q12H  . topiramate  75 mg Oral Daily  . umeclidinium-vilanterol  1 puff Inhalation Daily   Continuous Infusions: . sodium chloride 250 mL (05/20/17 0607)   PRN Meds:.sodium chloride, acetaminophen **OR** acetaminophen, albuterol, ALPRAZolam, ondansetron (ZOFRAN) IV, ondansetron, oxyCODONE-acetaminophen **AND** oxyCODONE, sodium chloride flush, sodium chloride flush, tiZANidine  Micro Results Recent Results (from the past 240 hour(s))  Culture, Urine     Status: None   Collection Time: 05/15/17  9:19 PM  Result Value Ref Range Status   Specimen Description URINE, CLEAN CATCH  Final   Special Requests NONE  Final   Culture   Final    NO GROWTH Performed at [facility], [place] [address], [place], [place] [address]    Report Status 05/17/2017 FINAL  Final  MRSA PCR Screening     Status: Abnormal   Collection Time: 05/20/17  4:20 AM  Result Value Ref Range Status   MRSA by PCR POSITIVE (A) NEGATIVE Final    Comment:        The GeneXpert MRSA Assay  (FDA approved for NASAL specimens only), is one component of a comprehensive MRSA colonization surveillance program. It is not intended to diagnose MRSA infection nor to guide or monitor treatment for MRSA infections. RESULT CALLED TO, READ BACK BY AND VERIFIED WITH: Verdene Rio RN 9:50 05/20/17 ([email or web address])     Radiology Reports Nm Myocar Multi W/spect W/wall Motion / Ef  Result Date: 05/17/2017  There was no ST segment deviation noted during stress.  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 Prior small basal septal infarct.  This is an intermediate risk study. Borderline TID of 1.29 may suggest higher risk and balanced ischemia  The left ventricular ejection fraction is normal (55-65%).    Dg Chest Portable 1 View  Result Date: 05/16/2017 CLINICAL DATA:  Chest pain EXAM: PORTABLE CHEST 1 VIEW COMPARISON:  01/30/2017 FINDINGS: Patient rotation limits evaluation. Heart size and pulmonary vascularity are normal.  Linear atelectasis in the lung bases, slightly more pronounced than on previous study. No developing consolidation or airspace disease. No pneumothorax. IMPRESSION: Shallow inspiration with linear atelectasis in the lung bases. Electronically Signed   By: Lucienne Capers M.D.   On: 05/16/2017 00:59    Time Spent in minutes  30   [name] M.D on 05/21/2017 at 8:01 AM  Between 7am to 7am - Pager - [phone]

## 2017-05-21 NOTE — Progress Notes (Signed)
Progress Note  Patient Name: Stephanie Combs Date of Encounter: 05/21/2017  Primary Cardiologist: Carlyle Dolly, MD   Subjective   Continues with intermittent CP; no dyspnea  Inpatient Medications    Scheduled Meds: . albuterol  2.5 mg Nebulization TID  . aspirin EC  81 mg Oral Daily  . atorvastatin  80 mg Oral q1800  . Chlorhexidine Gluconate Cloth  6 each Topical Q0600  . cholecalciferol  5,000 Units Oral Daily  . diltiazem  300 mg Oral Daily  . famotidine  20 mg Oral QHS  . feeding supplement (PRO-STAT SUGAR FREE 64)  30 mL Oral BID  . ferrous sulfate  325 mg Oral Q breakfast  . gabapentin  300 mg Oral TID  . Gerhardt's butt cream   Topical TID  . insulin aspart  0-5 Units Subcutaneous QHS  . insulin aspart  0-9 Units Subcutaneous TID WC  . insulin detemir  50 Units Subcutaneous QHS  . isosorbide mononitrate  30 mg Oral Daily  . levothyroxine  224 mcg Oral QAC breakfast  . mupirocin ointment  1 application Nasal BID  . nystatin   Topical BID  . pantoprazole  40 mg Oral Daily  . sodium chloride flush  3 mL Intravenous Q12H  . sodium chloride flush  3 mL Intravenous Q12H  . topiramate  75 mg Oral Daily  . umeclidinium-vilanterol  1 puff Inhalation Daily   Continuous Infusions: . sodium chloride 250 mL (05/20/17 0607)  . heparin 1,400 Units/hr (05/20/17 1752)   PRN Meds: sodium chloride, acetaminophen **OR** acetaminophen, albuterol, ALPRAZolam, ondansetron (ZOFRAN) IV, ondansetron, oxyCODONE-acetaminophen **AND** oxyCODONE, sodium chloride flush, sodium chloride flush, tiZANidine   Vital Signs    Vitals:   05/20/17 2049 05/21/17 0014 05/21/17 0438 05/21/17 0528  BP: 126/61 124/76 (!) 113/59   Pulse: 73     Resp: 16 17 (!) 27   Temp: 99.1 F (37.3 C) 97.9 F (36.6 C) 98.2 F (36.8 C)   TempSrc: Oral Oral Oral   SpO2:      Weight:    283 lb 8.2 oz (128.6 kg)  Height:        Intake/Output Summary (Last 24 hours) at 05/21/2017 0730 Last data filed at  05/20/2017 1400 Gross per 24 hour  Intake 230 ml  Output 550 ml  Net -320 ml   Filed Weights   05/19/17 0615 05/20/17 0516 05/21/17 0528  Weight: 275 lb 5.7 oz (124.9 kg) 283 lb 8.2 oz (128.6 kg) 283 lb 8.2 oz (128.6 kg)    Telemetry    Sinus bradycardia - Personally Reviewed  Physical Exam   GEN: WD, obese Neck: supple Cardiac: RRR, no murmur Respiratory: CTA anteriorly GI: Soft, NT, obese, healing ulcer MS: No lower ext edema Neuro:  no focal findings   Labs    Chemistry Recent Labs  Lab 05/18/17 0618 05/19/17 0622 05/20/17 0853 05/21/17 0309  NA 135 135 135 136  K 4.5 4.1 3.9 4.7  CL 97* 102 101 103  CO2 29 27 27 29   GLUCOSE 199* 176* 178* 240*  BUN 30* 25* 19 24*  CREATININE 1.66* 1.06* 1.05* 1.37*  CALCIUM 9.2 8.8* 8.8* 8.9  PROT 7.2 5.8* 6.0*  --   ALBUMIN 3.7 3.0* 2.9*  --   AST 15 13* 16  --   ALT 10* 10* 10*  --   ALKPHOS 82 71 74  --   BILITOT 0.5 0.5 0.5  --   GFRNONAA 31* 53* 54* 39*  GFRAA 36* >60 >60 45*  ANIONGAP 9 6 7  4*     Hematology Recent Labs  Lab 05/19/17 0622 05/20/17 0853 05/21/17 0309  WBC 5.9 6.0 5.5  RBC 4.06 4.15 3.97  HGB 10.7* 10.6* 10.6*  HCT 35.0* 35.7* 34.3*  MCV 86.2 86.0 86.4  MCH 26.4 25.5* 26.7  MCHC 30.6 29.7* 30.9  RDW 15.6* 15.5 15.9*  PLT 281 262 255    Cardiac Enzymes Recent Labs  Lab 05/15/17 1837 05/16/17 0011 05/16/17 0517  TROPONINI <0.03 <0.03 <0.03    Patient Profile     67 year old female with past medical history of chronic diastolic congestive heart failure, diabetes mellitus, hypertension, hyperlipidemia, home O2 dependent COPD for evaluation of chest pain and abnormal nuclear study. Enzymes negative. Nuclear study shows normal LV function and prior apical infarct with peri-infarct ischemia. Echocardiogram shows normal LV function, biatrial enlargement and mild right ventricular enlargement.  Cardiac catheterization reveals ostial 65 LAD, 70 followed by 65 prox LAD and first diagonal of  80%.    Assessment & Plan    1 chest pain-Difficult situation; pt continues to have intermittent CP predominantly at night; nuclear study abnormal; cardiac catheterization reveals LAD and diagonal disease.  Pt does not want to consider CABG (she also has home O2 dependent COPD, morbid obesity and limited functional capacity). Dr Ellyn Hack and Dr Tamala Julian reviewed films yesterday and felt medical therapy should be attempted; will also review with other interventionalists today. Continue aspirin and statin. DC cardizem. Add metoprolol 25 mg BID. Increase imdur to 60 mg daily. Change heparin to SQ.  2 acute on chronic diastolic congestive heart failure-renal function slightly worse today; will resume diuretics in AM if renal function stable.  3 acute on chronic stage III kidney disease-diuretics on hold; recheck BMET in AM.  4 hypertension-blood pressure is controlled.  Continue present medications.  5 home O2 dependent COPD    For questions or updates, please contact [place] Please consult [email or web address] for contact info under Cardiology/STEMI.      Signed, Kirk Ruths, MD  05/21/2017, 7:30 AM

## 2017-05-22 ENCOUNTER — Encounter (HOSPITAL_COMMUNITY)
Admission: AD | Disposition: A | Discharge status: Home Health | Payer: Self-pay | Source: Ambulatory Visit | Attending: Internal Medicine

## 2017-05-22 DIAGNOSIS — N183 Chronic kidney disease, stage 3 (moderate): Secondary | ICD-10-CM

## 2017-05-22 DIAGNOSIS — I200 Unstable angina: Secondary | ICD-10-CM

## 2017-05-22 DIAGNOSIS — I2510 Atherosclerotic heart disease of native coronary artery without angina pectoris: Secondary | ICD-10-CM

## 2017-05-22 HISTORY — PX: CORONARY STENT INTERVENTION: CATH118234

## 2017-05-22 LAB — CBC
HCT: 33.2 % — ABNORMAL LOW (ref 36.0–46.0)
Hemoglobin: 9.8 g/dL — ABNORMAL LOW (ref 12.0–15.0)
MCH: 25.4 pg — ABNORMAL LOW (ref 26.0–34.0)
MCHC: 29.5 g/dL — ABNORMAL LOW (ref 30.0–36.0)
MCV: 86 fL (ref 78.0–100.0)
Platelets: 229 10*3/uL (ref 150–400)
RBC: 3.86 MIL/uL — ABNORMAL LOW (ref 3.87–5.11)
RDW: 15.8 % — ABNORMAL HIGH (ref 11.5–15.5)
WBC: 6.1 10*3/uL (ref 4.0–10.5)

## 2017-05-22 LAB — BASIC METABOLIC PANEL
Anion gap: 6 (ref 5–15)
BUN: 26 mg/dL — ABNORMAL HIGH (ref 6–20)
CO2: 29 mmol/L (ref 22–32)
Calcium: 9 mg/dL (ref 8.9–10.3)
Chloride: 99 mmol/L — ABNORMAL LOW (ref 101–111)
Creatinine, Ser: 1.41 mg/dL — ABNORMAL HIGH (ref 0.44–1.00)
GFR calc Af Amer: 44 mL/min — ABNORMAL LOW (ref >60)
GFR calc non Af Amer: 38 mL/min — ABNORMAL LOW (ref >60)
Glucose, Bld: 144 mg/dL — ABNORMAL HIGH (ref 65–99)
Potassium: 4.3 mmol/L (ref 3.5–5.1)
Sodium: 134 mmol/L — ABNORMAL LOW (ref 135–145)

## 2017-05-22 LAB — GLUCOSE, CAPILLARY
Glucose-Capillary: 94 mg/dL (ref 65–99)
Glucose-Capillary: 81 mg/dL (ref 65–99)
Glucose-Capillary: 159 mg/dL — ABNORMAL HIGH (ref 65–99)

## 2017-05-22 LAB — POCT ACTIVATED CLOTTING TIME: Activated Clotting Time: 516 seconds

## 2017-05-22 LAB — PROTIME-INR
INR: 0.94
Prothrombin Time: 12.5 seconds (ref 11.4–15.2)

## 2017-05-22 SURGERY — CORONARY STENT INTERVENTION
Anesthesia: LOCAL

## 2017-05-22 MED ORDER — IOPAMIDOL (ISOVUE-370) INJECTION 76%
INTRAVENOUS | Status: AC
  Filled 2017-05-22: qty 50

## 2017-05-22 MED ORDER — LIDOCAINE HCL (PF) 1 % IJ SOLN
INTRAMUSCULAR | Status: AC
  Filled 2017-05-22: qty 30

## 2017-05-22 MED ORDER — MIDAZOLAM HCL 2 MG/2ML IJ SOLN
INTRAMUSCULAR | Status: AC
  Filled 2017-05-22: qty 2

## 2017-05-22 MED ORDER — BIVALIRUDIN TRIFLUOROACETATE 250 MG IV SOLR
INTRAVENOUS | Status: AC
  Filled 2017-05-22: qty 250

## 2017-05-22 MED ORDER — HEPARIN SODIUM (PORCINE) 1000 UNIT/ML IJ SOLN
INTRAMUSCULAR | Status: AC
  Filled 2017-05-22: qty 1

## 2017-05-22 MED ORDER — SODIUM CHLORIDE 0.9 % IV SOLN: INTRAVENOUS | Status: DC

## 2017-05-22 MED ORDER — VERAPAMIL HCL 2.5 MG/ML IV SOLN
INTRAVENOUS | Status: AC
  Filled 2017-05-22: qty 2

## 2017-05-22 MED ORDER — FENTANYL CITRATE (PF) 100 MCG/2ML IJ SOLN
INTRAMUSCULAR | Status: DC | PRN
  Administered 2017-05-22 (×4): 25 ug via INTRAVENOUS

## 2017-05-22 MED ORDER — FENTANYL CITRATE (PF) 100 MCG/2ML IJ SOLN
INTRAMUSCULAR | Status: AC
  Filled 2017-05-22: qty 2

## 2017-05-22 MED ORDER — IOPAMIDOL (ISOVUE-300) INJECTION 61%
INTRAVENOUS | Status: AC
  Filled 2017-05-22: qty 50

## 2017-05-22 MED ORDER — SODIUM CHLORIDE 0.9 % IV SOLN: 250.0000 mL | INTRAVENOUS | Status: DC | PRN

## 2017-05-22 MED ORDER — HEPARIN (PORCINE) IN NACL 2-0.9 UNIT/ML-% IJ SOLN
INTRAMUSCULAR | Status: AC | PRN
  Administered 2017-05-22: 1000 mL

## 2017-05-22 MED ORDER — SODIUM CHLORIDE 0.9 % IV SOLN
INTRAVENOUS | Status: DC
  Administered 2017-05-22: 22:00:00 via INTRAVENOUS

## 2017-05-22 MED ORDER — SODIUM CHLORIDE 0.9 % IV SOLN
250.0000 mL | INTRAVENOUS | Status: DC | PRN
  Administered 2017-05-22: 250 mL via INTRAVENOUS

## 2017-05-22 MED ORDER — CLOPIDOGREL BISULFATE 75 MG PO TABS
75.0000 mg | ORAL_TABLET | Freq: Every day | ORAL | Status: DC
  Administered 2017-05-23 – 2017-05-26 (×4): 75 mg via ORAL
  Filled 2017-05-22 (×4): qty 1

## 2017-05-22 MED ORDER — MIDAZOLAM HCL 2 MG/2ML IJ SOLN
INTRAMUSCULAR | Status: DC | PRN
  Administered 2017-05-22 (×2): 1 mg via INTRAVENOUS

## 2017-05-22 MED ORDER — BIVALIRUDIN BOLUS VIA INFUSION - CUPID
INTRAVENOUS | Status: DC | PRN
  Administered 2017-05-22: 94.875 mg via INTRAVENOUS

## 2017-05-22 MED ORDER — SODIUM CHLORIDE 0.9 % WEIGHT BASED INFUSION: 1.0000 mL/kg/h | INTRAVENOUS | Status: DC

## 2017-05-22 MED ORDER — LIDOCAINE HCL (PF) 1 % IJ SOLN
INTRAMUSCULAR | Status: DC | PRN
  Administered 2017-05-22: 2 mL

## 2017-05-22 MED ORDER — IOPAMIDOL (ISOVUE-370) INJECTION 76%
INTRAVENOUS | Status: AC
  Filled 2017-05-22: qty 100

## 2017-05-22 MED ORDER — NITROGLYCERIN 1 MG/10 ML FOR IR/CATH LAB
INTRA_ARTERIAL | Status: DC | PRN
  Administered 2017-05-22: 200 ug via INTRACORONARY

## 2017-05-22 MED ORDER — VERAPAMIL HCL 2.5 MG/ML IV SOLN
INTRAVENOUS | Status: DC | PRN
  Administered 2017-05-22: 10 mL via INTRA_ARTERIAL

## 2017-05-22 MED ORDER — NITROGLYCERIN 1 MG/10 ML FOR IR/CATH LAB
INTRA_ARTERIAL | Status: AC
  Filled 2017-05-22: qty 10

## 2017-05-22 MED ORDER — ASPIRIN 81 MG PO CHEW
81.0000 mg | CHEWABLE_TABLET | ORAL | Status: AC
  Administered 2017-05-22: 81 mg via ORAL
  Filled 2017-05-22: qty 1

## 2017-05-22 MED ORDER — SODIUM CHLORIDE 0.9 % IV SOLN
INTRAVENOUS | Status: DC | PRN
  Administered 2017-05-22: 17:00:00
  Administered 2017-05-22: 1.75 mg/kg/h via INTRAVENOUS

## 2017-05-22 MED ORDER — SODIUM CHLORIDE 0.9% FLUSH: 3.0000 mL | INTRAVENOUS | Status: DC | PRN

## 2017-05-22 MED ORDER — SODIUM CHLORIDE 0.9% FLUSH
3.0000 mL | Freq: Two times a day (BID) | INTRAVENOUS | Status: DC
  Administered 2017-05-22: 3 mL via INTRAVENOUS

## 2017-05-22 MED ORDER — HEPARIN (PORCINE) IN NACL 2-0.9 UNIT/ML-% IJ SOLN
INTRAMUSCULAR | Status: AC
  Filled 2017-05-22: qty 1000

## 2017-05-22 MED ORDER — IOPAMIDOL (ISOVUE-370) INJECTION 76%
INTRAVENOUS | Status: DC | PRN
  Administered 2017-05-22: 130 mL via INTRA_ARTERIAL

## 2017-05-22 MED ORDER — CLOPIDOGREL BISULFATE 300 MG PO TABS
600.0000 mg | ORAL_TABLET | Freq: Once | ORAL | Status: AC
  Administered 2017-05-22: 600 mg via ORAL
  Filled 2017-05-22: qty 2

## 2017-05-22 SURGICAL SUPPLY — 20 items
BALLN SAPPHIRE 2.5X12 (BALLOONS) ×2
BALLN WOLVERINE 2.25X10 (BALLOONS) ×2
BALLN ~~LOC~~ EUPHORA RX 2.75X15 (BALLOONS) ×2
BALLOON SAPPHIRE 2.5X12 (BALLOONS) ×1 IMPLANT
BALLOON WOLVERINE 2.25X10 (BALLOONS) ×1 IMPLANT
BALLOON ~~LOC~~ EUPHORA RX 2.75X15 (BALLOONS) ×1 IMPLANT
CATH VISTA GUIDE 6FR XBLAD3.5 (CATHETERS) ×2 IMPLANT
DEVICE RAD COMP TR BAND LRG (VASCULAR PRODUCTS) ×2 IMPLANT
GLIDESHEATH SLEND SS 6F .021 (SHEATH) ×2 IMPLANT
GUIDEWIRE INQWIRE 1.5J.035X260 (WIRE) ×2 IMPLANT
INQWIRE 1.5J .035X260CM (WIRE) ×4
KIT ENCORE 26 ADVANTAGE (KITS) ×4 IMPLANT
KIT HEART LEFT (KITS) ×2 IMPLANT
PACK CARDIAC CATHETERIZATION (CUSTOM PROCEDURE TRAY) ×2 IMPLANT
STENT SYNERGY DES 2.5X20 (Permanent Stent) ×2 IMPLANT
TRANSDUCER W/STOPCOCK (MISCELLANEOUS) ×2 IMPLANT
TUBING CIL FLEX 10 FLL-RA (TUBING) ×2 IMPLANT
WIRE ASAHI PROWATER 180CM (WIRE) ×2 IMPLANT
WIRE FIGHTER CROSSING 190CM (WIRE) ×2 IMPLANT
WIRE PT2 MS 185 (WIRE) ×2 IMPLANT

## 2017-05-22 NOTE — H&P (View-Only) (Signed)
Progress Note  Patient Name: Stephanie Combs Date of Encounter: 05/22/2017  Primary Cardiologist: Carlyle Dolly, MD   Subjective   No dyspnea; continues with intermittent CP  Inpatient Medications    Scheduled Meds: . albuterol  2.5 mg Nebulization TID  . aspirin EC  81 mg Oral Daily  . atorvastatin  80 mg Oral q1800  . Chlorhexidine Gluconate Cloth  6 each Topical Q0600  . cholecalciferol  5,000 Units Oral Daily  . famotidine  20 mg Oral QHS  . feeding supplement (PRO-STAT SUGAR FREE 64)  30 mL Oral BID  . ferrous sulfate  325 mg Oral Q breakfast  . gabapentin  300 mg Oral TID  . Gerhardt's butt cream   Topical TID  . heparin injection (subcutaneous)  5,000 Units Subcutaneous Q8H  . insulin aspart  0-5 Units Subcutaneous QHS  . insulin aspart  0-9 Units Subcutaneous TID WC  . insulin detemir  50 Units Subcutaneous QHS  . isosorbide mononitrate  60 mg Oral Daily  . levothyroxine  224 mcg Oral QAC breakfast  . metoprolol tartrate  25 mg Oral BID  . mupirocin ointment  1 application Nasal BID  . nystatin   Topical BID  . pantoprazole  40 mg Oral Daily  . sodium chloride flush  3 mL Intravenous Q12H  . sodium chloride flush  3 mL Intravenous Q12H  . topiramate  75 mg Oral Daily  . umeclidinium-vilanterol  1 puff Inhalation Daily   Continuous Infusions: . sodium chloride 250 mL (05/21/17 0957)   PRN Meds: sodium chloride, acetaminophen **OR** acetaminophen, albuterol, ALPRAZolam, ondansetron (ZOFRAN) IV, ondansetron, oxyCODONE-acetaminophen **AND** oxyCODONE, sodium chloride flush, sodium chloride flush, tiZANidine   Vital Signs    Vitals:   05/21/17 1602 05/21/17 2007 05/22/17 0005 05/22/17 0418  BP: 131/66 (!) 125/54 136/65 (!) 123/47  Pulse: 64 65 65   Resp: 16 (!) 22 20 15   Temp: 97.6 F (36.4 C) 98.4 F (36.9 C)  97.8 F (36.6 C)  TempSrc: Oral Oral Oral Oral  SpO2: 100% 97% 100% 100%  Weight:    287 lb 11.2 oz (130.5 kg)  Height:         Intake/Output Summary (Last 24 hours) at 05/22/2017 0722 Last data filed at 05/22/2017 0422 Gross per 24 hour  Intake 1007.75 ml  Output 1650 ml  Net -642.25 ml   Filed Weights   05/20/17 0516 05/21/17 0528 05/22/17 0418  Weight: 283 lb 8.2 oz (128.6 kg) 283 lb 8.2 oz (128.6 kg) 287 lb 11.2 oz (130.5 kg)    Telemetry    Sinus bradycardia - Personally Reviewed  Physical Exam   GEN: WD, morbidly obese, NAD Neck: supple, no JVD Cardiac: RRR Respiratory: CTA anteriorly, no wheeze GI: Soft, NT, morbidly obese, small nonhealing ulcer MS: ext with no edema Neuro:  grossly intact   Labs    Chemistry Recent Labs  Lab 05/18/17 0618 05/19/17 0622 05/20/17 0853 05/21/17 0309 05/22/17 0315  NA 135 135 135 136 134*  K 4.5 4.1 3.9 4.7 4.3  CL 97* 102 101 103 99*  CO2 29 27 27 29 29   GLUCOSE 199* 176* 178* 240* 144*  BUN 30* 25* 19 24* 26*  CREATININE 1.66* 1.06* 1.05* 1.37* 1.41*  CALCIUM 9.2 8.8* 8.8* 8.9 9.0  PROT 7.2 5.8* 6.0*  --   --   ALBUMIN 3.7 3.0* 2.9*  --   --   AST 15 13* 16  --   --   ALT  10* 10* 10*  --   --   ALKPHOS 82 71 74  --   --   BILITOT 0.5 0.5 0.5  --   --   GFRNONAA 31* 53* 54* 39* 38*  GFRAA 36* >60 >60 45* 44*  ANIONGAP 9 6 7  4* 6     Hematology Recent Labs  Lab 05/20/17 0853 05/21/17 0309 05/22/17 0315  WBC 6.0 5.5 6.1  RBC 4.15 3.97 3.86*  HGB 10.6* 10.6* 9.8*  HCT 35.7* 34.3* 33.2*  MCV 86.0 86.4 86.0  MCH 25.5* 26.7 25.4*  MCHC 29.7* 30.9 29.5*  RDW 15.5 15.9* 15.8*  PLT 262 255 229    Cardiac Enzymes Recent Labs  Lab 05/15/17 1837 05/16/17 0011 05/16/17 0517  TROPONINI <0.03 <0.03 <0.03    Patient Profile     67 year old female with past medical history of chronic diastolic congestive heart failure, diabetes mellitus, hypertension, hyperlipidemia, home O2 dependent COPD for evaluation of chest pain and abnormal nuclear study. Enzymes negative. Nuclear study shows normal LV function and prior apical infarct with  peri-infarct ischemia. Echocardiogram shows normal LV function, biatrial enlargement and mild right ventricular enlargement.  Cardiac catheterization reveals ostial 65 LAD, 70 followed by 65 prox LAD and first diagonal of 80%.    Assessment & Plan    1 chest pain-Pt continues to have CP despite medical therapy; nuclear study abnormal; cardiac catheterization reveals LAD and diagonal disease.  Pt not agreeable to CABG and I do not think she would be a surgical candidate regardless (home O2 dependent COPD, morbid obesity and limited functional capacity). I have reviewed films with Dr [place] this AM; given continued CP despite medical therapy, high risk PCI will be pursued (risk and benefits including MI, CVA, death and worsening renal insuff discussed with pt and she agrees to proceed). Continue aspirin, metoprolol, imdur and statin. Load with plavix 600 mg x 1; plavix 75 mg daily thereafter. Hydrate prior to cath; follow renal function after procedure.   2 acute on chronic diastolic congestive heart failure-diuretics on hold prior to PCI.  3 acute on chronic stage III kidney disease-diuretics on hold; hydrate prior to PCI; recheck BMET in AM.  4 hypertension-blood pressure is controlled.  Continue present medications.  5 home O2 dependent COPD    For questions or updates, please contact [place] Please consult [email or web address] for contact info under Cardiology/STEMI.      Signed, Kirk Ruths, MD  05/22/2017, 7:22 AM

## 2017-05-22 NOTE — Interval H&P Note (Signed)
History and Physical Interval Note:  05/22/2017 3:55 PM  Stephanie Combs  has presented today for surgery, with the diagnosis of cad  The various methods of treatment have been discussed with the patient and family. After consideration of risks, benefits and other options for treatment, the patient has consented to  Procedure(s): CORONARY STENT INTERVENTION (N/A) as a surgical intervention .  The patient's history has been reviewed, patient examined, no change in status, stable for surgery.  I have reviewed the patient's chart and labs.  Questions were answered to the patient's satisfaction.    Cath Lab Visit (complete for each Cath Lab visit)  Clinical Evaluation Leading to the Procedure:   ACS: Yes.    Non-ACS:    Anginal Classification: CCS IV  Anti-ischemic medical therapy: Maximal Therapy (2 or more classes of medications)  Non-Invasive Test Results: Intermediate-risk stress test findings: cardiac mortality 1-3%/year  Prior CABG: No previous CABG       Stephanie Combs [facility] 05/22/2017 3:56 PM

## 2017-05-22 NOTE — Progress Notes (Signed)
Progress Note  Patient Name: Stephanie Combs Date of Encounter: 05/22/2017  Primary Cardiologist: Carlyle Dolly, MD   Subjective   No dyspnea; continues with intermittent CP  Inpatient Medications    Scheduled Meds: . albuterol  2.5 mg Nebulization TID  . aspirin EC  81 mg Oral Daily  . atorvastatin  80 mg Oral q1800  . Chlorhexidine Gluconate Cloth  6 each Topical Q0600  . cholecalciferol  5,000 Units Oral Daily  . famotidine  20 mg Oral QHS  . feeding supplement (PRO-STAT SUGAR FREE 64)  30 mL Oral BID  . ferrous sulfate  325 mg Oral Q breakfast  . gabapentin  300 mg Oral TID  . Gerhardt's butt cream   Topical TID  . heparin injection (subcutaneous)  5,000 Units Subcutaneous Q8H  . insulin aspart  0-5 Units Subcutaneous QHS  . insulin aspart  0-9 Units Subcutaneous TID WC  . insulin detemir  50 Units Subcutaneous QHS  . isosorbide mononitrate  60 mg Oral Daily  . levothyroxine  224 mcg Oral QAC breakfast  . metoprolol tartrate  25 mg Oral BID  . mupirocin ointment  1 application Nasal BID  . nystatin   Topical BID  . pantoprazole  40 mg Oral Daily  . sodium chloride flush  3 mL Intravenous Q12H  . sodium chloride flush  3 mL Intravenous Q12H  . topiramate  75 mg Oral Daily  . umeclidinium-vilanterol  1 puff Inhalation Daily   Continuous Infusions: . sodium chloride 250 mL (05/21/17 0957)   PRN Meds: sodium chloride, acetaminophen **OR** acetaminophen, albuterol, ALPRAZolam, ondansetron (ZOFRAN) IV, ondansetron, oxyCODONE-acetaminophen **AND** oxyCODONE, sodium chloride flush, sodium chloride flush, tiZANidine   Vital Signs    Vitals:   05/21/17 1602 05/21/17 2007 05/22/17 0005 05/22/17 0418  BP: 131/66 (!) 125/54 136/65 (!) 123/47  Pulse: 64 65 65   Resp: 16 (!) 22 20 15   Temp: 97.6 F (36.4 C) 98.4 F (36.9 C)  97.8 F (36.6 C)  TempSrc: Oral Oral Oral Oral  SpO2: 100% 97% 100% 100%  Weight:    287 lb 11.2 oz (130.5 kg)  Height:         Intake/Output Summary (Last 24 hours) at 05/22/2017 0722 Last data filed at 05/22/2017 0422 Gross per 24 hour  Intake 1007.75 ml  Output 1650 ml  Net -642.25 ml   Filed Weights   05/20/17 0516 05/21/17 0528 05/22/17 0418  Weight: 283 lb 8.2 oz (128.6 kg) 283 lb 8.2 oz (128.6 kg) 287 lb 11.2 oz (130.5 kg)    Telemetry    Sinus bradycardia - Personally Reviewed  Physical Exam   GEN: WD, morbidly obese, NAD Neck: supple, no JVD Cardiac: RRR Respiratory: CTA anteriorly, no wheeze GI: Soft, NT, morbidly obese, small nonhealing ulcer MS: ext with no edema Neuro:  grossly intact   Labs    Chemistry Recent Labs  Lab 05/18/17 0618 05/19/17 0622 05/20/17 0853 05/21/17 0309 05/22/17 0315  NA 135 135 135 136 134*  K 4.5 4.1 3.9 4.7 4.3  CL 97* 102 101 103 99*  CO2 29 27 27 29 29   GLUCOSE 199* 176* 178* 240* 144*  BUN 30* 25* 19 24* 26*  CREATININE 1.66* 1.06* 1.05* 1.37* 1.41*  CALCIUM 9.2 8.8* 8.8* 8.9 9.0  PROT 7.2 5.8* 6.0*  --   --   ALBUMIN 3.7 3.0* 2.9*  --   --   AST 15 13* 16  --   --   ALT  10* 10* 10*  --   --   ALKPHOS 82 71 74  --   --   BILITOT 0.5 0.5 0.5  --   --   GFRNONAA 31* 53* 54* 39* 38*  GFRAA 36* >60 >60 45* 44*  ANIONGAP 9 6 7  4* 6     Hematology Recent Labs  Lab 05/20/17 0853 05/21/17 0309 05/22/17 0315  WBC 6.0 5.5 6.1  RBC 4.15 3.97 3.86*  HGB 10.6* 10.6* 9.8*  HCT 35.7* 34.3* 33.2*  MCV 86.0 86.4 86.0  MCH 25.5* 26.7 25.4*  MCHC 29.7* 30.9 29.5*  RDW 15.5 15.9* 15.8*  PLT 262 255 229    Cardiac Enzymes Recent Labs  Lab 05/15/17 1837 05/16/17 0011 05/16/17 0517  TROPONINI <0.03 <0.03 <0.03    Patient Profile     67 year old female with past medical history of chronic diastolic congestive heart failure, diabetes mellitus, hypertension, hyperlipidemia, home O2 dependent COPD for evaluation of chest pain and abnormal nuclear study. Enzymes negative. Nuclear study shows normal LV function and prior apical infarct with  peri-infarct ischemia. Echocardiogram shows normal LV function, biatrial enlargement and mild right ventricular enlargement.  Cardiac catheterization reveals ostial 65 LAD, 70 followed by 65 prox LAD and first diagonal of 80%.    Assessment & Plan    1 chest pain-Pt continues to have CP despite medical therapy; nuclear study abnormal; cardiac catheterization reveals LAD and diagonal disease.  Pt not agreeable to CABG and I do not think she would be a surgical candidate regardless (home O2 dependent COPD, morbid obesity and limited functional capacity). I have reviewed films with Dr Martinique this AM; given continued CP despite medical therapy, high risk PCI will be pursued (risk and benefits including MI, CVA, death and worsening renal insuff discussed with pt and she agrees to proceed). Continue aspirin, metoprolol, imdur and statin. Load with plavix 600 mg x 1; plavix 75 mg daily thereafter. Hydrate prior to cath; follow renal function after procedure.   2 acute on chronic diastolic congestive heart failure-diuretics on hold prior to PCI.  3 acute on chronic stage III kidney disease-diuretics on hold; hydrate prior to PCI; recheck BMET in AM.  4 hypertension-blood pressure is controlled.  Continue present medications.  5 home O2 dependent COPD    For questions or updates, please contact [place] Please consult [email or web address] for contact info under Cardiology/STEMI.      Signed, Kirk Ruths, MD  05/22/2017, 7:22 AM

## 2017-05-22 NOTE — Progress Notes (Signed)
2cc removed from TR band, 12cc remaining. No bleeding noted or c/o at this time. WCTM

## 2017-05-22 NOTE — Care Management Note (Signed)
Case Management Note  Patient Details  Name: Stephanie Combs MRN: [id number] Date of Birth: 11/15/1950  Subjective/Objective:                  Pt admitted with CHF and edema  Action/Plan: PTA from home, pt is active with [facility] to assist with wound care - agency will resume services at discharge - CM requested resumption order.  Pt is on home oxygen supplied by [facility]    Expected Discharge Date:  (unknown)               Expected Discharge Plan:  [place]  In-House Referral:     Discharge planning Services  CM Consult  Post Acute Care Choice:  Resumption of Svcs/PTA Provider Choice offered to:     DME Arranged:    DME Agency:     HH Arranged:  [facility] [place] Agency:  Nanine Means)  Status of Service:  In process, will continue to follow  If discussed at Long Length of Stay Meetings, dates discussed:    Additional Comments: Pt states her daughter can transport her home at discharge - daughter will also retrieve portable oxygen tank from her home and bring to hospital for transport home.   Pt is currently on baseline oxygen need of 2 liters Maryclare Labrador, RN 05/22/2017, 4:20 PM

## 2017-05-22 NOTE — Progress Notes (Signed)
Inpatient Diabetes Program Recommendations  AACE/ADA: New Consensus Statement on Inpatient Glycemic Control (2015)  Target Ranges:  Prepandial:   less than 140 mg/dL      Peak postprandial:   less than 180 mg/dL (1-2 hours)      Critically ill patients:  140 - 180 mg/dL   Results for Stephanie Combs, Stephanie Combs (MRN [id number]) as of 05/22/2017 09:21  Ref. Range 05/21/2017 08:41 05/21/2017 12:38 05/21/2017 16:01 05/21/2017 22:52  Glucose-Capillary Latest Ref Range: 65 - 99 mg/dL 159 (H) 220 (H) 229 (H) 230 (H)   Results for Stephanie Combs, Stephanie Combs (MRN [id number]) as of 05/22/2017 09:21  Ref. Range 05/22/2017 08:26  Glucose-Capillary Latest Ref Range: 65 - 99 mg/dL 94    Home DM Meds: Levemir 50 units QHS       Humalog 10-16 units TID       Victoza 1.2 mg daily  Current Insulin Orders: Levemir 50 units QHS      Novolog Sensitive Correction Scale/ SSI (0-9 units) TID AC + HS        Note patient NPO today.  Has been eating 100% of meals the past few days.  Having elevated postprandial glucose levels.  Takes Humalog 10-16 units TID with meals at home.  MD- Please consider starting Novolog Meal Coverage for patient after patient resumes PO diet today post-procedure:  Novolog 5 units TID with meals (hold if pt eats <50% of meal)       --Will follow patient during hospitalization--  Wyn Quaker RN, MSN, CDE Diabetes Coordinator Inpatient Glycemic Control Team Team Pager: [phone] (8a-5p)

## 2017-05-22 NOTE — Progress Notes (Signed)
3cc removed from TR band, 9 remaining. No bleeding or c/o at this time. WCTM

## 2017-05-22 NOTE — Progress Notes (Signed)
Patient ID: Stephanie Combs, female   DOB: Oct 19, 1950, 67 y.o.   MRN: [id number]                                                                PROGRESS NOTE                                                                                                                                                                                                             Patient Demographics:    Stephanie Combs, is a 67 y.o. female, DOB - 1950-06-21, BOF:[id number]  Admit date - 05/15/2017   Admitting Physician [name], MD  Outpatient Primary MD for the patient is [name], MD  LOS - 7  Outpatient Specialists:    No chief complaint on file.      Brief Narrative  67 y.o.female,w hypothyroidism, hypertension, CHF (EF 65%), Copd Dm2 w neuropathy apparently c/o increase in bilateral lower ext edema, + weight gain , dyspnea, orthopnea, as well as chest pain "burning" and squeezing and radiation to the left arm for the past 2 week. Intermittent. Last had chest pain last nite. Pt requested direct admission for chest pain and dyspnea and lower ext edema.       Subjective:    Stephanie Combs apparently had some chest pain last nite.  Cardiology contemplating stent.  No chest pain currently .   No headache,  No abdominal pain - No Nausea, No new weakness tingling or numbness, No Cough - SOB.    Assessment  & Plan :    Active Problems:   DM type 2 (diabetes mellitus, type 2) (HCC)   Dyspnea   Edema   Abnormal nuclear stress test   Atypical angina (HCC)     Chest pain, nuclear stress test =>intermediate risk S/p Cardiac cath 05/19/2017=>LAD lesions Cont aspirin Cont Lipitor 80mg  po qhs Cont Cardizem  Cont imdur Heparin Iv per pharmacy Agree with Plavix load Cardiology consult, appreciated Awaiting cath vs CABG  Renal insufficiency Hydrate gently w ns this evening.   Dyspnea/ wheezing likely mild Copd exacerbation ContAnoro Contalbuterol neb tid   LE edema (failed  outpatient diuresis), CHF (diastolic) Hold lasix due to renal insufficiency bmp in am  Dm2/neuropathy fsbs ac and qhs iss  Hypothyroidism Cont levothyroxine  Anemia Check cbc in  am  Abdominal wound, 3x2cm pink granulation tissue Continue wound care  Code Status:DNR  Family Communication :w patient  Disposition Plan:home  Barriers For Discharge:  Consults :cardiology consult  Procedures :   DVT Prophylaxis: Heparin       Anti-infectives(From admission, onward)   None             Lab Results  Component Value Date   PLT 229 05/22/2017     Anti-infectives (From admission, onward)   None        Objective:   Vitals:   05/21/17 1602 05/21/17 2007 05/22/17 0005 05/22/17 0418  BP: 131/66 (!) 125/54 136/65 (!) 123/47  Pulse: 64 65 65   Resp: 16 (!) 22 20 15   Temp: 97.6 F (36.4 C) 98.4 F (36.9 C)  97.8 F (36.6 C)  TempSrc: Oral Oral Oral Oral  SpO2: 100% 97% 100% 100%  Weight:    130.5 kg (287 lb 11.2 oz)  Height:        Wt Readings from Last 3 Encounters:  05/22/17 130.5 kg (287 lb 11.2 oz)  02/02/17 133.4 kg (294 lb 1.5 oz)  01/26/17 (!) 141.1 kg (311 lb)     Intake/Output Summary (Last 24 hours) at 05/22/2017 0801 Last data filed at 05/22/2017 0422 Gross per 24 hour  Intake 1007.75 ml  Output 1650 ml  Net -642.25 ml     Physical Exam  Awake Alert, Oriented X 3, No new F.N deficits, Normal affect [place].AT,PERRAL Supple Neck,No JVD, No cervical lymphadenopathy appriciated.  Symmetrical Chest wall movement, Good air movement bilaterally, CTAB RRR,No Gallops,Rubs or new Murmurs, No Parasternal Heave +ve B.Sounds, Abd Soft, No tenderness, No organomegaly appriciated, No rebound - guarding or rigidity. No Cyanosis, Clubbing or edema, No new Rash or bruise        2x3cm oval wound mid lower abdomen        Data Review:    CBC Recent Labs  Lab 05/18/17 0618 05/19/17 0622  05/20/17 0853 05/21/17 0309 05/22/17 0315  WBC 6.7 5.9 6.0 5.5 6.1  HGB 11.8* 10.7* 10.6* 10.6* 9.8*  HCT 39.1 35.0* 35.7* 34.3* 33.2*  PLT 325 281 262 255 229  MCV 86.7 86.2 86.0 86.4 86.0  MCH 26.2 26.4 25.5* 26.7 25.4*  MCHC 30.2 30.6 29.7* 30.9 29.5*  RDW 15.8* 15.6* 15.5 15.9* 15.8*    Chemistries  Recent Labs  Lab 05/15/17 1837 05/17/17 0602 05/18/17 0618 05/19/17 0622 05/20/17 0853 05/21/17 0309 05/22/17 0315  NA 137 137 135 135 135 136 134*  K 4.0 4.2 4.5 4.1 3.9 4.7 4.3  CL 101 101 97* 102 101 103 99*  CO2 29 28 29 27 27 29 29   GLUCOSE 179* 210* 199* 176* 178* 240* 144*  BUN 29* 28* 30* 25* 19 24* 26*  CREATININE 1.21* 1.44* 1.66* 1.06* 1.05* 1.37* 1.41*  CALCIUM 9.0 8.7* 9.2 8.8* 8.8* 8.9 9.0  AST 16 11* 15 13* 16  --   --   ALT 11* 8* 10* 10* 10*  --   --   ALKPHOS 82 69 82 71 74  --   --   BILITOT 0.5 0.5 0.5 0.5 0.5  --   --    ------------------------------------------------------------------------------------------------------------------ No results for input(s): CHOL, HDL, LDLCALC, TRIG, CHOLHDL, LDLDIRECT in the last 72 hours.  Lab Results  Component Value Date   HGBA1C 7.7 (H) 05/15/2017   ------------------------------------------------------------------------------------------------------------------ No results for input(s): TSH, T4TOTAL, T3FREE, THYROIDAB in the last 72 hours.  Invalid input(s): FREET3 ------------------------------------------------------------------------------------------------------------------  No results for input(s): VITAMINB12, FOLATE, FERRITIN, TIBC, IRON, RETICCTPCT in the last 72 hours.  Coagulation profile Recent Labs  Lab 05/16/17 0517  INR 0.99    No results for input(s): DDIMER in the last 72 hours.  Cardiac Enzymes Recent Labs  Lab 05/15/17 1837 05/16/17 0011 05/16/17 0517  TROPONINI <0.03 <0.03 <0.03    ------------------------------------------------------------------------------------------------------------------    Component Value Date/Time   BNP 108.0 (H) 05/01/2017 1000    Inpatient Medications  Scheduled Meds: . albuterol  2.5 mg Nebulization TID  . aspirin EC  81 mg Oral Daily  . atorvastatin  80 mg Oral q1800  . Chlorhexidine Gluconate Cloth  6 each Topical Q0600  . cholecalciferol  5,000 Units Oral Daily  . clopidogrel  600 mg Oral Once  . [START ON 05/23/2017] clopidogrel  75 mg Oral Daily  . famotidine  20 mg Oral QHS  . feeding supplement (PRO-STAT SUGAR FREE 64)  30 mL Oral BID  . ferrous sulfate  325 mg Oral Q breakfast  . gabapentin  300 mg Oral TID  . Gerhardt's butt cream   Topical TID  . heparin injection (subcutaneous)  5,000 Units Subcutaneous Q8H  . insulin aspart  0-5 Units Subcutaneous QHS  . insulin aspart  0-9 Units Subcutaneous TID WC  . insulin detemir  50 Units Subcutaneous QHS  . isosorbide mononitrate  60 mg Oral Daily  . levothyroxine  224 mcg Oral QAC breakfast  . metoprolol tartrate  25 mg Oral BID  . mupirocin ointment  1 application Nasal BID  . nystatin   Topical BID  . pantoprazole  40 mg Oral Daily  . sodium chloride flush  3 mL Intravenous Q12H  . sodium chloride flush  3 mL Intravenous Q12H  . topiramate  75 mg Oral Daily  . umeclidinium-vilanterol  1 puff Inhalation Daily   Continuous Infusions: . sodium chloride    . sodium chloride     PRN Meds:.sodium chloride, acetaminophen **OR** acetaminophen, albuterol, ALPRAZolam, ondansetron (ZOFRAN) IV, ondansetron, oxyCODONE-acetaminophen **AND** oxyCODONE, sodium chloride flush, sodium chloride flush, tiZANidine  Micro Results Recent Results (from the past 240 hour(s))  Culture, Urine     Status: None   Collection Time: 05/15/17  9:19 PM  Result Value Ref Range Status   Specimen Description URINE, CLEAN CATCH  Final   Special Requests NONE  Final   Culture   Final    NO  GROWTH Performed at [facility], [place] [address], [place], [place] [address]    Report Status 05/17/2017 FINAL  Final  MRSA PCR Screening     Status: Abnormal   Collection Time: 05/20/17  4:20 AM  Result Value Ref Range Status   MRSA by PCR POSITIVE (A) NEGATIVE Final    Comment:        The GeneXpert MRSA Assay (FDA approved for NASAL specimens only), is one component of a comprehensive MRSA colonization surveillance program. It is not intended to diagnose MRSA infection nor to guide or monitor treatment for MRSA infections. RESULT CALLED TO, READ BACK BY AND VERIFIED WITH: Verdene Rio RN 9:50 05/20/17 ([email or web address])     Radiology Reports Nm Myocar Multi W/spect W/wall Motion / Ef  Result Date: 05/17/2017  There was no ST segment deviation noted during stress.  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 Prior small basal septal infarct.  This is an intermediate risk study. Borderline TID of 1.29 may suggest higher risk and balanced  ischemia  The left ventricular ejection fraction is normal (55-65%).    Dg Chest Portable 1 View  Result Date: 05/16/2017 CLINICAL DATA:  Chest pain EXAM: PORTABLE CHEST 1 VIEW COMPARISON:  01/30/2017 FINDINGS: Patient rotation limits evaluation. Heart size and pulmonary vascularity are normal. Linear atelectasis in the lung bases, slightly more pronounced than on previous study. No developing consolidation or airspace disease. No pneumothorax. IMPRESSION: Shallow inspiration with linear atelectasis in the lung bases. Electronically Signed   By: Lucienne Capers M.D.   On: 05/16/2017 00:59    Time Spent in minutes  30   [name] M.D on 05/22/2017 at 8:01 AM  Between 7am to 7am - Pager - [phone]

## 2017-05-23 ENCOUNTER — Encounter (HOSPITAL_COMMUNITY): Payer: Self-pay | Admitting: Cardiology

## 2017-05-23 LAB — CBC
HCT: 34.7 % — ABNORMAL LOW (ref 36.0–46.0)
Hemoglobin: 10.4 g/dL — ABNORMAL LOW (ref 12.0–15.0)
MCH: 25.9 pg — ABNORMAL LOW (ref 26.0–34.0)
MCHC: 30 g/dL (ref 30.0–36.0)
MCV: 86.5 fL (ref 78.0–100.0)
Platelets: 244 10*3/uL (ref 150–400)
RBC: 4.01 MIL/uL (ref 3.87–5.11)
RDW: 15.9 % — ABNORMAL HIGH (ref 11.5–15.5)
WBC: 6.3 10*3/uL (ref 4.0–10.5)

## 2017-05-23 LAB — BASIC METABOLIC PANEL
Anion gap: 5 (ref 5–15)
BUN: 22 mg/dL — ABNORMAL HIGH (ref 6–20)
CO2: 28 mmol/L (ref 22–32)
Calcium: 9.1 mg/dL (ref 8.9–10.3)
Chloride: 104 mmol/L (ref 101–111)
Creatinine, Ser: 1.28 mg/dL — ABNORMAL HIGH (ref 0.44–1.00)
GFR calc Af Amer: 49 mL/min — ABNORMAL LOW (ref >60)
GFR calc non Af Amer: 42 mL/min — ABNORMAL LOW (ref >60)
Glucose, Bld: 161 mg/dL — ABNORMAL HIGH (ref 65–99)
Potassium: 4.5 mmol/L (ref 3.5–5.1)
Sodium: 137 mmol/L (ref 135–145)

## 2017-05-23 LAB — GLUCOSE, CAPILLARY
Glucose-Capillary: 143 mg/dL — ABNORMAL HIGH (ref 65–99)
Glucose-Capillary: 199 mg/dL — ABNORMAL HIGH (ref 65–99)
Glucose-Capillary: 204 mg/dL — ABNORMAL HIGH (ref 65–99)
Glucose-Capillary: 349 mg/dL — ABNORMAL HIGH (ref 65–99)

## 2017-05-23 MED ORDER — SODIUM CHLORIDE 0.9 % IV SOLN: 250.0000 mL | INTRAVENOUS | Status: DC | PRN

## 2017-05-23 MED ORDER — SODIUM CHLORIDE 0.9% FLUSH: 3.0000 mL | INTRAVENOUS | Status: DC | PRN

## 2017-05-23 MED ORDER — ISOSORBIDE MONONITRATE ER 30 MG PO TB24
30.0000 mg | ORAL_TABLET | Freq: Every day | ORAL | Status: DC
  Administered 2017-05-23 – 2017-05-26 (×4): 30 mg via ORAL
  Filled 2017-05-23 (×3): qty 1

## 2017-05-23 MED ORDER — POLYETHYLENE GLYCOL 3350 17 G PO PACK
17.0000 g | PACK | Freq: Every day | ORAL | Status: DC | PRN
  Administered 2017-05-23: 17 g via ORAL
  Filled 2017-05-23: qty 1

## 2017-05-23 MED ORDER — INSULIN DETEMIR 100 UNIT/ML ~~LOC~~ SOLN
52.0000 [IU] | Freq: Every day | SUBCUTANEOUS | Status: DC
  Administered 2017-05-23 – 2017-05-25 (×3): 52 [IU] via SUBCUTANEOUS
  Filled 2017-05-23 (×5): qty 0.52

## 2017-05-23 MED ORDER — SODIUM CHLORIDE 0.9% FLUSH
3.0000 mL | Freq: Two times a day (BID) | INTRAVENOUS | Status: DC
  Administered 2017-05-24 – 2017-05-26 (×3): 3 mL via INTRAVENOUS

## 2017-05-23 MED ORDER — ASPIRIN 81 MG PO CHEW: 81.0000 mg | CHEWABLE_TABLET | ORAL | Status: AC

## 2017-05-23 MED ORDER — SODIUM CHLORIDE 0.9 % IV SOLN: INTRAVENOUS | Status: DC

## 2017-05-23 MED ORDER — HEPARIN SODIUM (PORCINE) 5000 UNIT/ML IJ SOLN
5000.0000 [IU] | Freq: Three times a day (TID) | INTRAMUSCULAR | Status: DC
  Administered 2017-05-23 – 2017-05-26 (×11): 5000 [IU] via SUBCUTANEOUS
  Filled 2017-05-23 (×11): qty 1

## 2017-05-23 MED ORDER — FUROSEMIDE 40 MG PO TABS
40.0000 mg | ORAL_TABLET | Freq: Every day | ORAL | Status: DC
  Administered 2017-05-23 – 2017-05-26 (×4): 40 mg via ORAL
  Filled 2017-05-23 (×4): qty 1

## 2017-05-23 MED ORDER — SODIUM CHLORIDE 0.9 % WEIGHT BASED INFUSION: 1.0000 mL/kg/h | INTRAVENOUS | Status: AC

## 2017-05-23 MED ORDER — LABETALOL HCL 5 MG/ML IV SOLN: 10.0000 mg | INTRAVENOUS | Status: AC | PRN

## 2017-05-23 MED ORDER — HYDRALAZINE HCL 20 MG/ML IJ SOLN: 5.0000 mg | INTRAMUSCULAR | Status: AC | PRN

## 2017-05-23 MED ORDER — SODIUM CHLORIDE 0.9% FLUSH
3.0000 mL | Freq: Two times a day (BID) | INTRAVENOUS | Status: DC
  Administered 2017-05-23: 3 mL via INTRAVENOUS

## 2017-05-23 NOTE — Progress Notes (Signed)
CARDIAC REHAB PHASE I   PRE:  Rate/Rhythm: 80 SR  BP:  Supine:   Sitting: 123/44  Standing:    SaO2: 98 2L  MODE:  Ambulation: 140 ft   POST:  Rate/Rhythm: 114  BP:  Supine:   Sitting: 109/59  Standing:    SaO2: 98 2L 1310-1425 Assisted X 2 to ambulate. Used walker and O2 2L to ambulate and pushed wheelchair behind her. Pt states that she has not walked in 3 months. States that she mostly goes to the [facility] to chair and bed. Pt was willing to try to walk. She was able to walk 140 feet. Pt was exhausted by end of walk. To recliner after walk with call light in reach. Started stent education with pt and gave her stent card.We discussed Plavix and I left her diabetic diet guidelines. She states that she does not leave home but to go to the doctor.Pt is not a candidate for Outpt. CRP at this point. Pt would also benefit from Physical Therapy consult to assist with discharge needs.   Rodney Langton RN 05/23/2017 2:23 PM

## 2017-05-23 NOTE — Progress Notes (Signed)
Patient ID: Stephanie Combs, female   DOB: 08-18-1950, 67 y.o.   MRN: [id number]                                                               PROGRESS NOTE                                                                                                                                                                                                             Patient Demographics:    Stephanie Combs, is a 67 y.o. female, DOB - 1950-09-11, LNL:[id number]  Admit date - 05/15/2017   Admitting Physician [name], MD  Outpatient Primary MD for the patient is [name], MD  LOS - 8  Outpatient Specialists: Dr. Clayburn Pert  No chief complaint on file.    Dyspnea, chest pain  Brief Narrative   67 y.o.female,w hypothyroidism, hypertension, CHF (EF 65%), Copd Dm2 w neuropathy apparently c/o increase in bilateral lower ext edema, + weight gain , dyspnea, orthopnea, as well as chest pain "burning" and squeezing and radiation to the left arm for the past 2 week. Intermittent. Last had chest pain last nite. Pt requested direct admission for chest pain and dyspnea and lower ext edema.      Subjective:    Stephanie Combs today has slight substernal chest discomfort "burning" and pressure.  Slight headache from nitro. Doing well s/p cath and DES to LAD as well as angioplasty to LM.   Breathing good.  Afebrile.    No abdominal pain - No Nausea, No new weakness tingling or numbness   Assessment  & Plan :    Active Problems:   DM type 2 (diabetes mellitus, type 2) (HCC)   Dyspnea   Edema   Abnormal nuclear stress test   Atypical angina (HCC)     Chest pain, nuclear stress test =>intermediate risk S/p Cardiac cath 05/19/2017=>LAD lesions S/p cardiac cath 05/22/2017=> intervension with angioplasty to LM and DES to LAD Cont aspirin Cont Lipitor 80mg  po qhs Cont Cardizem Cont imdur Cont plavix Cardiology consult, appreciated   Renal insufficiency Bmp pending cmp in  am  Dyspnea/ wheezing likely mild Copd exacerbation ContAnoro Contalbuterol neb tid   LE edema (failed outpatient diuresis), CHF (diastolic) Hold lasix due to renal insufficiency Comp in am  Dm2/neuropathy  fsbs ac and qhs iss Increase levemir to 52 units  Hypothyroidism Cont levothyroxine  Anemia Check cbc in am  Abdominal wound, 3x2cm pink granulation tissue Continue wound care  Code Status:DNR  Family Communication :w patient  Disposition Plan:home  Barriers For Discharge:  Consults :cardiology consult  Procedures :   DVT Prophylaxis: Heparin         Lab Results  Component Value Date   PLT 229 05/22/2017     Anti-infectives (From admission, onward)   None        Objective:   Vitals:   05/22/17 1633 05/22/17 1955 05/23/17 0021 05/23/17 0413  BP:  (!) 103/54 124/61 (!) 145/57  Pulse:  64 67 (!) 52  Resp:  11 (!) 25 15  Temp:  97.6 F (36.4 C) 98.3 F (36.8 C)   TempSrc:  Oral Oral   SpO2: 97% 100% 100% 99%  Weight:    127.8 kg (281 lb 12 oz)  Height:        Wt Readings from Last 3 Encounters:  05/23/17 127.8 kg (281 lb 12 oz)  02/02/17 133.4 kg (294 lb 1.5 oz)  01/26/17 (!) 141.1 kg (311 lb)     Intake/Output Summary (Last 24 hours) at 05/23/2017 0628 Last data filed at 05/23/2017 0030 Gross per 24 hour  Intake 799.75 ml  Output 1450 ml  Net -650.25 ml     Physical Exam  Awake Alert, Oriented X 3, No new F.N deficits, Normal affect .AT,PERRAL Supple Neck,No JVD, No cervical lymphadenopathy appriciated.  Symmetrical Chest wall movement, Good air movement bilaterally, CTAB RRR,No Gallops,Rubs or new Murmurs, No Parasternal Heave +ve B.Sounds, Abd Soft, No tenderness, No organomegaly appriciated, No rebound - guarding or rigidity. No Cyanosis, Clubbing or edema, No new Rash or bruise      2x3cm oval wound mid lower abdomen        Data Review:    CBC Recent Labs  Lab  05/18/17 0618 05/19/17 0622 05/20/17 0853 05/21/17 0309 05/22/17 0315  WBC 6.7 5.9 6.0 5.5 6.1  HGB 11.8* 10.7* 10.6* 10.6* 9.8*  HCT 39.1 35.0* 35.7* 34.3* 33.2*  PLT 325 281 262 255 229  MCV 86.7 86.2 86.0 86.4 86.0  MCH 26.2 26.4 25.5* 26.7 25.4*  MCHC 30.2 30.6 29.7* 30.9 29.5*  RDW 15.8* 15.6* 15.5 15.9* 15.8*    Chemistries  Recent Labs  Lab 05/17/17 0602 05/18/17 0618 05/19/17 0622 05/20/17 0853 05/21/17 0309 05/22/17 0315  NA 137 135 135 135 136 134*  K 4.2 4.5 4.1 3.9 4.7 4.3  CL 101 97* 102 101 103 99*  CO2 28 29 27 27 29 29   GLUCOSE 210* 199* 176* 178* 240* 144*  BUN 28* 30* 25* 19 24* 26*  CREATININE 1.44* 1.66* 1.06* 1.05* 1.37* 1.41*  CALCIUM 8.7* 9.2 8.8* 8.8* 8.9 9.0  AST 11* 15 13* 16  --   --   ALT 8* 10* 10* 10*  --   --   ALKPHOS 69 82 71 74  --   --   BILITOT 0.5 0.5 0.5 0.5  --   --    ------------------------------------------------------------------------------------------------------------------ No results for input(s): CHOL, HDL, LDLCALC, TRIG, CHOLHDL, LDLDIRECT in the last 72 hours.  Lab Results  Component Value Date   HGBA1C 7.7 (H) 05/15/2017   ------------------------------------------------------------------------------------------------------------------ No results for input(s): TSH, T4TOTAL, T3FREE, THYROIDAB in the last 72 hours.  Invalid input(s): FREET3 ------------------------------------------------------------------------------------------------------------------ No results for input(s): VITAMINB12, FOLATE, FERRITIN, TIBC, IRON, RETICCTPCT in the last 72  hours.  Coagulation profile Recent Labs  Lab 05/22/17 1324  INR 0.94    No results for input(s): DDIMER in the last 72 hours.  Cardiac Enzymes No results for input(s): CKMB, TROPONINI, MYOGLOBIN in the last 168 hours.  Invalid input(s): CK ------------------------------------------------------------------------------------------------------------------     Component Value Date/Time   BNP 108.0 (H) 05/01/2017 1000    Inpatient Medications  Scheduled Meds: . albuterol  2.5 mg Nebulization TID  . [START ON 05/24/2017] aspirin  81 mg Oral Pre-Cath  . aspirin EC  81 mg Oral Daily  . atorvastatin  80 mg Oral q1800  . Chlorhexidine Gluconate Cloth  6 each Topical Q0600  . cholecalciferol  5,000 Units Oral Daily  . clopidogrel  75 mg Oral Daily  . famotidine  20 mg Oral QHS  . feeding supplement (PRO-STAT SUGAR FREE 64)  30 mL Oral BID  . ferrous sulfate  325 mg Oral Q breakfast  . gabapentin  300 mg Oral TID  . Gerhardt's butt cream   Topical TID  . heparin  5,000 Units Subcutaneous Q8H  . insulin aspart  0-5 Units Subcutaneous QHS  . insulin aspart  0-9 Units Subcutaneous TID WC  . insulin detemir  50 Units Subcutaneous QHS  . isosorbide mononitrate  60 mg Oral Daily  . levothyroxine  224 mcg Oral QAC breakfast  . metoprolol tartrate  25 mg Oral BID  . mupirocin ointment  1 application Nasal BID  . nystatin   Topical BID  . pantoprazole  40 mg Oral Daily  . sodium chloride flush  3 mL Intravenous Q12H  . sodium chloride flush  3 mL Intravenous Q12H  . sodium chloride flush  3 mL Intravenous Q12H  . sodium chloride flush  3 mL Intravenous Q12H  . topiramate  75 mg Oral Daily  . umeclidinium-vilanterol  1 puff Inhalation Daily   Continuous Infusions: . sodium chloride    . [START ON 05/24/2017] sodium chloride    . sodium chloride    . sodium chloride     PRN Meds:.sodium chloride, sodium chloride, acetaminophen **OR** acetaminophen, albuterol, ALPRAZolam, hydrALAZINE, labetalol, ondansetron (ZOFRAN) IV, ondansetron, oxyCODONE-acetaminophen **AND** oxyCODONE, sodium chloride flush, sodium chloride flush, sodium chloride flush, sodium chloride flush, tiZANidine  Micro Results Recent Results (from the past 240 hour(s))  Culture, Urine     Status: None   Collection Time: 05/15/17  9:19 PM  Result Value Ref Range Status   Specimen  Description URINE, CLEAN CATCH  Final   Special Requests NONE  Final   Culture   Final    NO GROWTH Performed at [facility], [place] [address] [place], [place] [address]    Report Status 05/17/2017 FINAL  Final  MRSA PCR Screening     Status: Abnormal   Collection Time: 05/20/17  4:20 AM  Result Value Ref Range Status   MRSA by PCR POSITIVE (A) NEGATIVE Final    Comment:        The GeneXpert MRSA Assay (FDA approved for NASAL specimens only), is one component of a comprehensive MRSA colonization surveillance program. It is not intended to diagnose MRSA infection nor to guide or monitor treatment for MRSA infections. RESULT CALLED TO, READ BACK BY AND VERIFIED WITH: Verdene Rio RN 9:50 05/20/17 ([email or web address])     Radiology Reports Nm Myocar Multi W/spect W/wall Motion / Ef  Result Date: 05/17/2017  There was no ST segment deviation noted during stress.  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 Prior small basal septal infarct.  This is an intermediate risk study. Borderline TID of 1.29 may suggest higher risk and balanced ischemia  The left ventricular ejection fraction is normal (55-65%).    Dg Chest Portable 1 View  Result Date: 05/16/2017 CLINICAL DATA:  Chest pain EXAM: PORTABLE CHEST 1 VIEW COMPARISON:  01/30/2017 FINDINGS: Patient rotation limits evaluation. Heart size and pulmonary vascularity are normal. Linear atelectasis in the lung bases, slightly more pronounced than on previous study. No developing consolidation or airspace disease. No pneumothorax. IMPRESSION: Shallow inspiration with linear atelectasis in the lung bases. Electronically Signed   By: Lucienne Capers M.D.   On: 05/16/2017 00:59    Time Spent in minutes  30   [name] M.D on 05/23/2017 at 6:28 AM  Between 7am to 7am - Pager - [phone]

## 2017-05-23 NOTE — Progress Notes (Signed)
Progress Note  Patient Name: Stephanie Combs Date of Encounter: 05/23/2017  Primary Cardiologist: Carlyle Dolly, MD   Subjective   Chest "sore and burning"; no dyspnea  Inpatient Medications    Scheduled Meds: . albuterol  2.5 mg Nebulization TID  . [START ON 05/24/2017] aspirin  81 mg Oral Pre-Cath  . aspirin EC  81 mg Oral Daily  . atorvastatin  80 mg Oral q1800  . Chlorhexidine Gluconate Cloth  6 each Topical Q0600  . cholecalciferol  5,000 Units Oral Daily  . clopidogrel  75 mg Oral Daily  . famotidine  20 mg Oral QHS  . feeding supplement (PRO-STAT SUGAR FREE 64)  30 mL Oral BID  . ferrous sulfate  325 mg Oral Q breakfast  . gabapentin  300 mg Oral TID  . Gerhardt's butt cream   Topical TID  . heparin  5,000 Units Subcutaneous Q8H  . insulin aspart  0-5 Units Subcutaneous QHS  . insulin aspart  0-9 Units Subcutaneous TID WC  . insulin detemir  52 Units Subcutaneous QHS  . isosorbide mononitrate  60 mg Oral Daily  . levothyroxine  224 mcg Oral QAC breakfast  . metoprolol tartrate  25 mg Oral BID  . mupirocin ointment  1 application Nasal BID  . nystatin   Topical BID  . pantoprazole  40 mg Oral Daily  . sodium chloride flush  3 mL Intravenous Q12H  . sodium chloride flush  3 mL Intravenous Q12H  . sodium chloride flush  3 mL Intravenous Q12H  . sodium chloride flush  3 mL Intravenous Q12H  . topiramate  75 mg Oral Daily  . umeclidinium-vilanterol  1 puff Inhalation Daily   Continuous Infusions: . sodium chloride    . [START ON 05/24/2017] sodium chloride    . sodium chloride     PRN Meds: sodium chloride, sodium chloride, acetaminophen **OR** acetaminophen, albuterol, ALPRAZolam, ondansetron (ZOFRAN) IV, ondansetron, oxyCODONE-acetaminophen **AND** oxyCODONE, sodium chloride flush, sodium chloride flush, sodium chloride flush, sodium chloride flush, tiZANidine   Vital Signs    Vitals:   05/22/17 1633 05/22/17 1955 05/23/17 0021 05/23/17 0413  BP:  (!)  103/54 124/61 (!) 145/57  Pulse:  64 67 (!) 52  Resp:  11 (!) 25 15  Temp:  97.6 F (36.4 C) 98.3 F (36.8 C)   TempSrc:  Oral Oral   SpO2: 97% 100% 100% 99%  Weight:    281 lb 12 oz (127.8 kg)  Height:        Intake/Output Summary (Last 24 hours) at 05/23/2017 0747 Last data filed at 05/23/2017 0700 Gross per 24 hour  Intake 799.75 ml  Output 1800 ml  Net -1000.25 ml   Filed Weights   05/22/17 0418 05/22/17 1048 05/23/17 0413  Weight: 287 lb 11.2 oz (130.5 kg) 278 lb 14.1 oz (126.5 kg) 281 lb 12 oz (127.8 kg)    Telemetry    Sinus- Personally Reviewed  Physical Exam   GEN: Morbidly obese, NAD Neck: supple Cardiac: RRR, no murmur Respiratory: CTA GI: Soft, NT/ND small nonhealing ulcer, obese MS: ext with trace edema; radial cath site with no hematoma Neuro:  no focal findings   Labs    Chemistry Recent Labs  Lab 05/18/17 0618 05/19/17 0622 05/20/17 0853 05/21/17 0309 05/22/17 0315 05/23/17 0552  NA 135 135 135 136 134* 137  K 4.5 4.1 3.9 4.7 4.3 4.5  CL 97* 102 101 103 99* 104  CO2 29 27 27 29 29  28  GLUCOSE 199* 176* 178* 240* 144* 161*  BUN 30* 25* 19 24* 26* 22*  CREATININE 1.66* 1.06* 1.05* 1.37* 1.41* 1.28*  CALCIUM 9.2 8.8* 8.8* 8.9 9.0 9.1  PROT 7.2 5.8* 6.0*  --   --   --   ALBUMIN 3.7 3.0* 2.9*  --   --   --   AST 15 13* 16  --   --   --   ALT 10* 10* 10*  --   --   --   ALKPHOS 82 71 74  --   --   --   BILITOT 0.5 0.5 0.5  --   --   --   GFRNONAA 31* 53* 54* 39* 38* 42*  GFRAA 36* >60 >60 45* 44* 49*  ANIONGAP 9 6 7  4* 6 5     Hematology Recent Labs  Lab 05/21/17 0309 05/22/17 0315 05/23/17 0552  WBC 5.5 6.1 6.3  RBC 3.97 3.86* 4.01  HGB 10.6* 9.8* 10.4*  HCT 34.3* 33.2* 34.7*  MCV 86.4 86.0 86.5  MCH 26.7 25.4* 25.9*  MCHC 30.9 29.5* 30.0  RDW 15.9* 15.8* 15.9*  PLT 255 229 244     Patient Profile     67 year old female with past medical history of chronic diastolic congestive heart failure, diabetes mellitus,  hypertension, hyperlipidemia, home O2 dependent COPD for evaluation of chest pain and abnormal nuclear study. Enzymes negative. Nuclear study shows normal LV function and prior apical infarct with peri-infarct ischemia. Echocardiogram shows normal LV function, biatrial enlargement and mild right ventricular enlargement.  Cardiac catheterization reveals ostial 65 LAD, 70 followed by 65 prox LAD and first diagonal of 80%.    Assessment & Plan    1 chest pain-Pt s/p DES to LAD and PTCA of diagonal; chest "sore and burning this AM"; not clear all CP cardiac. Continue aspirin, plavix, metoprolol, imdur (decrease to 30 mg daily as BP borderline) and statin.   2 acute on chronic diastolic congestive heart failure-resume lasix 40 mg daily; bmet in AM.  3 acute on chronic stage III kidney disease-renal function stable; follow post PCI.  4 hypertension-blood pressure is borderline; decrease imdur to 30 mg daily and follow.  5 home O2 dependent COPD    For questions or updates, please contact [place] Please consult [email or web address] for contact info under Cardiology/STEMI.      Signed, Kirk Ruths, MD  05/23/2017, 7:47 AM

## 2017-05-24 LAB — COMPREHENSIVE METABOLIC PANEL
ALT: 11 U/L — ABNORMAL LOW (ref 14–54)
AST: 14 U/L — ABNORMAL LOW (ref 15–41)
Albumin: 2.8 g/dL — ABNORMAL LOW (ref 3.5–5.0)
Alkaline Phosphatase: 65 U/L (ref 38–126)
Anion gap: 9 (ref 5–15)
BUN: 25 mg/dL — ABNORMAL HIGH (ref 6–20)
CO2: 25 mmol/L (ref 22–32)
Calcium: 8.8 mg/dL — ABNORMAL LOW (ref 8.9–10.3)
Chloride: 101 mmol/L (ref 101–111)
Creatinine, Ser: 1.43 mg/dL — ABNORMAL HIGH (ref 0.44–1.00)
GFR calc Af Amer: 43 mL/min — ABNORMAL LOW (ref >60)
GFR calc non Af Amer: 37 mL/min — ABNORMAL LOW (ref >60)
Glucose, Bld: 261 mg/dL — ABNORMAL HIGH (ref 65–99)
Potassium: 4.4 mmol/L (ref 3.5–5.1)
Sodium: 135 mmol/L (ref 135–145)
Total Bilirubin: 0.5 mg/dL (ref 0.3–1.2)
Total Protein: 5.3 g/dL — ABNORMAL LOW (ref 6.5–8.1)

## 2017-05-24 LAB — GLUCOSE, CAPILLARY
Glucose-Capillary: 262 mg/dL — ABNORMAL HIGH (ref 65–99)
Glucose-Capillary: 152 mg/dL — ABNORMAL HIGH (ref 65–99)
Glucose-Capillary: 270 mg/dL — ABNORMAL HIGH (ref 65–99)

## 2017-05-24 LAB — CBC
HCT: 29.6 % — ABNORMAL LOW (ref 36.0–46.0)
Hemoglobin: 9.2 g/dL — ABNORMAL LOW (ref 12.0–15.0)
MCH: 26.7 pg (ref 26.0–34.0)
MCHC: 31.1 g/dL (ref 30.0–36.0)
MCV: 85.8 fL (ref 78.0–100.0)
Platelets: 228 10*3/uL (ref 150–400)
RBC: 3.45 MIL/uL — ABNORMAL LOW (ref 3.87–5.11)
RDW: 15.8 % — ABNORMAL HIGH (ref 11.5–15.5)
WBC: 6.3 10*3/uL (ref 4.0–10.5)

## 2017-05-24 MED ORDER — POLYETHYLENE GLYCOL 3350 17 G PO PACK
17.0000 g | PACK | Freq: Every day | ORAL | Status: DC
Start: 2017-05-24 — End: 2017-05-26
  Administered 2017-05-24 – 2017-05-26 (×3): 17 g via ORAL
  Filled 2017-05-24 (×4): qty 1

## 2017-05-24 MED ORDER — MINERAL OIL RE ENEM
1.0000 | ENEMA | Freq: Once | RECTAL | Status: AC
  Administered 2017-05-24: 1 via RECTAL
  Filled 2017-05-24: qty 1

## 2017-05-24 MED ORDER — ONDANSETRON HCL 4 MG/2ML IJ SOLN
4.0000 mg | INTRAMUSCULAR | Status: DC | PRN
  Administered 2017-05-24 – 2017-05-26 (×4): 4 mg via INTRAVENOUS
  Filled 2017-05-24 (×4): qty 2

## 2017-05-24 NOTE — Progress Notes (Signed)
Progress Note  Patient Name: Stephanie Combs Date of Encounter: 05/24/2017  Primary Cardiologist: Carlyle Dolly, MD   Subjective   Still with mild chest burning; no dyspnea this AM  Inpatient Medications    Scheduled Meds: . albuterol  2.5 mg Nebulization TID  . aspirin  81 mg Oral Pre-Cath  . aspirin EC  81 mg Oral Daily  . atorvastatin  80 mg Oral q1800  . Chlorhexidine Gluconate Cloth  6 each Topical Q0600  . cholecalciferol  5,000 Units Oral Daily  . clopidogrel  75 mg Oral Daily  . famotidine  20 mg Oral QHS  . feeding supplement (PRO-STAT SUGAR FREE 64)  30 mL Oral BID  . ferrous sulfate  325 mg Oral Q breakfast  . furosemide  40 mg Oral Daily  . gabapentin  300 mg Oral TID  . Gerhardt's butt cream   Topical TID  . heparin  5,000 Units Subcutaneous Q8H  . insulin aspart  0-5 Units Subcutaneous QHS  . insulin aspart  0-9 Units Subcutaneous TID WC  . insulin detemir  52 Units Subcutaneous QHS  . isosorbide mononitrate  30 mg Oral Daily  . levothyroxine  224 mcg Oral QAC breakfast  . metoprolol tartrate  25 mg Oral BID  . mupirocin ointment  1 application Nasal BID  . nystatin   Topical BID  . pantoprazole  40 mg Oral Daily  . sodium chloride flush  3 mL Intravenous Q12H  . sodium chloride flush  3 mL Intravenous Q12H  . sodium chloride flush  3 mL Intravenous Q12H  . sodium chloride flush  3 mL Intravenous Q12H  . topiramate  75 mg Oral Daily  . umeclidinium-vilanterol  1 puff Inhalation Daily   Continuous Infusions: . sodium chloride    . sodium chloride    . sodium chloride     PRN Meds: sodium chloride, sodium chloride, acetaminophen **OR** acetaminophen, albuterol, ALPRAZolam, ondansetron (ZOFRAN) IV, ondansetron, oxyCODONE-acetaminophen **AND** oxyCODONE, polyethylene glycol, sodium chloride flush, sodium chloride flush, sodium chloride flush, sodium chloride flush, tiZANidine   Vital Signs    Vitals:   05/24/17 0424 05/24/17 0626 05/24/17 0804  05/24/17 0830  BP:      Pulse: (!) 55   (!) 59  Resp: 15   16  Temp:    98.1 F (36.7 C)  TempSrc:    Oral  SpO2: 100%  100% 100%  Weight:  274 lb 0.5 oz (124.3 kg)    Height:        Intake/Output Summary (Last 24 hours) at 05/24/2017 0931 Last data filed at 05/24/2017 0017 Gross per 24 hour  Intake 240 ml  Output 1150 ml  Net -910 ml   Filed Weights   05/22/17 1048 05/23/17 0413 05/24/17 0626  Weight: 278 lb 14.1 oz (126.5 kg) 281 lb 12 oz (127.8 kg) 274 lb 0.5 oz (124.3 kg)    Telemetry    Sinus- Personally Reviewed  Physical Exam   GEN: WD Morbidly obese, NAD Neck: supple, no JVD Cardiac: RRR Respiratory: CTA, no wheeze GI: Soft, NT/ND MS: ext with trace edema Neuro:  grossly intact   Labs    Chemistry Recent Labs  Lab 05/19/17 0622 05/20/17 0853  05/22/17 0315 05/23/17 0552 05/24/17 0225  NA 135 135   < > 134* 137 135  K 4.1 3.9   < > 4.3 4.5 4.4  CL 102 101   < > 99* 104 101  CO2 27 27   < >  29 28 25   GLUCOSE 176* 178*   < > 144* 161* 261*  BUN 25* 19   < > 26* 22* 25*  CREATININE 1.06* 1.05*   < > 1.41* 1.28* 1.43*  CALCIUM 8.8* 8.8*   < > 9.0 9.1 8.8*  PROT 5.8* 6.0*  --   --   --  5.3*  ALBUMIN 3.0* 2.9*  --   --   --  2.8*  AST 13* 16  --   --   --  14*  ALT 10* 10*  --   --   --  11*  ALKPHOS 71 74  --   --   --  65  BILITOT 0.5 0.5  --   --   --  0.5  GFRNONAA 53* 54*   < > 38* 42* 37*  GFRAA >60 >60   < > 44* 49* 43*  ANIONGAP 6 7   < > 6 5 9    < > = values in this interval not displayed.     Hematology Recent Labs  Lab 05/22/17 0315 05/23/17 0552 05/24/17 0225  WBC 6.1 6.3 6.3  RBC 3.86* 4.01 3.45*  HGB 9.8* 10.4* 9.2*  HCT 33.2* 34.7* 29.6*  MCV 86.0 86.5 85.8  MCH 25.4* 25.9* 26.7  MCHC 29.5* 30.0 31.1  RDW 15.8* 15.9* 15.8*  PLT 229 244 228     Patient Profile     67 year old female with past medical history of chronic diastolic congestive heart failure, diabetes mellitus, hypertension, hyperlipidemia, home O2  dependent COPD for evaluation of chest pain and abnormal nuclear study. Enzymes negative. Nuclear study shows normal LV function and prior apical infarct with peri-infarct ischemia. Echocardiogram shows normal LV function, biatrial enlargement and mild right ventricular enlargement.  Cardiac catheterization reveals ostial 65 LAD, 70 followed by 65 prox LAD and first diagonal of 80%.    Assessment & Plan    1 chest pain-Pt s/p DES to LAD and PTCA of diagonal; continues with chest burning; I do not think this is cardiac pain. Continue aspirin, plavix, metoprolol, imdur and statin.   2 acute on chronic diastolic congestive heart failure-continue lasix 40 mg daily; follow renal function.  3 acute on chronic stage III kidney disease-repeat BMET in AM.  4 hypertension-blood pressure controlled; continue present meds.  5 home O2 dependent COPD  If renal function stable in AM, could be DCed from a cardiac standpoint; needs rehab.  For questions or updates, please contact [place] Please consult [email or web address] for contact info under Cardiology/STEMI.      Signed, Kirk Ruths, MD  05/24/2017, 9:31 AM

## 2017-05-24 NOTE — Progress Notes (Signed)
Patient ID: Stephanie Combs, female   DOB: [birth year], 67 y.o.   MRN: [id number]                                                                PROGRESS NOTE                                                                                                                                                                                                             Patient Demographics:    Stephanie Combs, is a 67 y.o. female, DOB - Jul 20, 1950, VEL:[id number]  Admit date - 05/15/2017   Admitting Physician [name], MD  Outpatient Primary MD for the patient is [name], MD  LOS - 9  Outpatient Specialists: Dr. Avon Gully  No chief complaint on file.     Dyspnea, chest pain  Brief Narrative    67 y.o.female,w hypothyroidism, hypertension, CHF (EF 65%), Copd Dm2 w neuropathy apparently c/o increase in bilateral lower ext edema, + weight gain , dyspnea, orthopnea, as well as chest pain "burning" and squeezing and radiation to the left arm for the past 2 week. Intermittent. Last had chest pain last nite. Pt requested direct admission for chest pain and dyspnea and lower ext edema.     Subjective:    [name] today has constipation, enema was ordered and she has had a bm. Slight chest discomfort but per cardiology seems atypical.  Breathing fine.    No headache, No abdominal pain - No Nausea, No new weakness tingling or numbness, No Cough - SOB.    Assessment  & Plan :    Active Problems:   DM type 2 (diabetes mellitus, type 2) (HCC)   Dyspnea   Edema   Abnormal nuclear stress test   Atypical angina (HCC)  Chest pain, nuclear stress test =>intermediate risk S/p Cardiac cath 05/19/2017=>LAD lesions S/p cardiac cath 05/22/2017=> intervension with angioplasty to LM and DES to LAD Cont aspirin Cont Lipitor 80mg  po qhs Cont Cardizem Cont imdur Cont plavix Cont lasix Cardiology consult, appreciated   Renal insufficiency cmp in am  Dyspnea/ wheezing likely mild Copd  exacerbation ContAnoro Contalbuterol neb tid   LE edema (failed outpatient diuresis), CHF (diastolic) Cont lasix 40mg  po qday Cmp in am  Dm2/neuropathy fsbs ac and qhs iss Increase levemir to  52 units 05/23/2017 Hypothyroidism Cont levothyroxine  Anemia Check cbc in am  Abdominal wound, 3x2cm pink granulation tissue Continue wound care  Code Status:DNR  Family Communication :w patient  Disposition Plan:home  Barriers For Discharge:  Consults :cardiology consult  Procedures :   DVT Prophylaxis: Heparin    Lab Results  Component Value Date   PLT 228 05/24/2017    Antibiotics  :  None  Anti-infectives (From admission, onward)   None        Objective:   Vitals:   05/24/17 0626 05/24/17 0804 05/24/17 0830 05/24/17 1155  BP:      Pulse:   (!) 59 (!) 53  Resp:   16 16  Temp:   98.1 F (36.7 C) 98.6 F (37 C)  TempSrc:   Oral Oral  SpO2:  100% 100% 99%  Weight: 124.3 kg (274 lb 0.5 oz)     Height:        Wt Readings from Last 3 Encounters:  05/24/17 124.3 kg (274 lb 0.5 oz)  02/02/17 133.4 kg (294 lb 1.5 oz)  01/26/17 (!) 141.1 kg (311 lb)     Intake/Output Summary (Last 24 hours) at 05/24/2017 1316 Last data filed at 05/24/2017 0017 Gross per 24 hour  Intake 240 ml  Output 950 ml  Net -710 ml     Physical Exam  Awake Alert, Oriented X 3, No new F.N deficits, Normal affect [place].AT,PERRAL Supple Neck,No JVD, No cervical lymphadenopathy appriciated.  Symmetrical Chest wall movement, Good air movement bilaterally, CTAB RRR,No Gallops,Rubs or new Murmurs, No Parasternal Heave +ve B.Sounds, Abd Soft, No tenderness, No organomegaly appriciated, No rebound - guarding or rigidity. No Cyanosis, Clubbing or edema, No new Rash or bruise     2x3cm oval wound mid lower abdomen       Data Review:    CBC Recent Labs  Lab 05/20/17 0853 05/21/17 0309 05/22/17 0315 05/23/17 0552 05/24/17 0225  WBC 6.0  5.5 6.1 6.3 6.3  HGB 10.6* 10.6* 9.8* 10.4* 9.2*  HCT 35.7* 34.3* 33.2* 34.7* 29.6*  PLT 262 255 229 244 228  MCV 86.0 86.4 86.0 86.5 85.8  MCH 25.5* 26.7 25.4* 25.9* 26.7  MCHC 29.7* 30.9 29.5* 30.0 31.1  RDW 15.5 15.9* 15.8* 15.9* 15.8*    Chemistries  Recent Labs  Lab 05/18/17 0618 05/19/17 0622 05/20/17 0853 05/21/17 0309 05/22/17 0315 05/23/17 0552 05/24/17 0225  NA 135 135 135 136 134* 137 135  K 4.5 4.1 3.9 4.7 4.3 4.5 4.4  CL 97* 102 101 103 99* 104 101  CO2 29 27 27 29 29 28 25   GLUCOSE 199* 176* 178* 240* 144* 161* 261*  BUN 30* 25* 19 24* 26* 22* 25*  CREATININE 1.66* 1.06* 1.05* 1.37* 1.41* 1.28* 1.43*  CALCIUM 9.2 8.8* 8.8* 8.9 9.0 9.1 8.8*  AST 15 13* 16  --   --   --  14*  ALT 10* 10* 10*  --   --   --  11*  ALKPHOS 82 71 74  --   --   --  65  BILITOT 0.5 0.5 0.5  --   --   --  0.5   ------------------------------------------------------------------------------------------------------------------ No results for input(s): CHOL, HDL, LDLCALC, TRIG, CHOLHDL, LDLDIRECT in the last 72 hours.  Lab Results  Component Value Date   HGBA1C 7.7 (H) 05/15/2017   ------------------------------------------------------------------------------------------------------------------ No results for input(s): TSH, T4TOTAL, T3FREE, THYROIDAB in the last 72 hours.  Invalid input(s): FREET3 ------------------------------------------------------------------------------------------------------------------ No results for input(s):  VITAMINB12, FOLATE, FERRITIN, TIBC, IRON, RETICCTPCT in the last 72 hours.  Coagulation profile Recent Labs  Lab 05/22/17 1324  INR 0.94    No results for input(s): DDIMER in the last 72 hours.  Cardiac Enzymes No results for input(s): CKMB, TROPONINI, MYOGLOBIN in the last 168 hours.  Invalid input(s): CK ------------------------------------------------------------------------------------------------------------------    Component Value  Date/Time   BNP 108.0 (H) 05/01/2017 1000    Inpatient Medications  Scheduled Meds: . aspirin  81 mg Oral Pre-Cath  . aspirin EC  81 mg Oral Daily  . atorvastatin  80 mg Oral q1800  . Chlorhexidine Gluconate Cloth  6 each Topical Q0600  . cholecalciferol  5,000 Units Oral Daily  . clopidogrel  75 mg Oral Daily  . famotidine  20 mg Oral QHS  . feeding supplement (PRO-STAT SUGAR FREE 64)  30 mL Oral BID  . ferrous sulfate  325 mg Oral Q breakfast  . furosemide  40 mg Oral Daily  . gabapentin  300 mg Oral TID  . Gerhardt's butt cream   Topical TID  . heparin  5,000 Units Subcutaneous Q8H  . insulin aspart  0-5 Units Subcutaneous QHS  . insulin aspart  0-9 Units Subcutaneous TID WC  . insulin detemir  52 Units Subcutaneous QHS  . isosorbide mononitrate  30 mg Oral Daily  . levothyroxine  224 mcg Oral QAC breakfast  . metoprolol tartrate  25 mg Oral BID  . mupirocin ointment  1 application Nasal BID  . nystatin   Topical BID  . pantoprazole  40 mg Oral Daily  . sodium chloride flush  3 mL Intravenous Q12H  . sodium chloride flush  3 mL Intravenous Q12H  . topiramate  75 mg Oral Daily  . umeclidinium-vilanterol  1 puff Inhalation Daily   Continuous Infusions: . sodium chloride    . sodium chloride     PRN Meds:.sodium chloride, acetaminophen **OR** acetaminophen, albuterol, ALPRAZolam, ondansetron (ZOFRAN) IV, ondansetron, oxyCODONE-acetaminophen **AND** oxyCODONE, polyethylene glycol, sodium chloride flush, sodium chloride flush, tiZANidine  Micro Results Recent Results (from the past 240 hour(s))  Culture, Urine     Status: None   Collection Time: 05/15/17  9:19 PM  Result Value Ref Range Status   Specimen Description URINE, CLEAN CATCH  Final   Special Requests NONE  Final   Culture   Final    NO GROWTH Performed at [facility], [place] [address] [place], [place] [address]    Report Status 05/17/2017 FINAL  Final  MRSA PCR Screening     Status: Abnormal    Collection Time: 05/20/17  4:20 AM  Result Value Ref Range Status   MRSA by PCR POSITIVE (A) NEGATIVE Final    Comment:        The GeneXpert MRSA Assay (FDA approved for NASAL specimens only), is one component of a comprehensive MRSA colonization surveillance program. It is not intended to diagnose MRSA infection nor to guide or monitor treatment for MRSA infections. RESULT CALLED TO, READ BACK BY AND VERIFIED WITH: Verdene Rio RN 9:50 05/20/17 ([email or web address])     Radiology Reports Nm Myocar Multi W/spect W/wall Motion / Ef  Result Date: 05/17/2017  There was no ST segment deviation noted during stress.  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 Prior small basal septal infarct.  This is an intermediate risk study. Borderline TID of 1.29 may suggest higher risk and balanced ischemia  The left ventricular ejection fraction is  normal (55-65%).    Dg Chest Portable 1 View  Result Date: 05/16/2017 CLINICAL DATA:  Chest pain EXAM: PORTABLE CHEST 1 VIEW COMPARISON:  01/30/2017 FINDINGS: Patient rotation limits evaluation. Heart size and pulmonary vascularity are normal. Linear atelectasis in the lung bases, slightly more pronounced than on previous study. No developing consolidation or airspace disease. No pneumothorax. IMPRESSION: Shallow inspiration with linear atelectasis in the lung bases. Electronically Signed   By: Lucienne Capers M.D.   On: 05/16/2017 00:59    Time Spent in minutes  30   [name] M.D on 05/24/2017 at 1:16 PM  Between 7am to 7am - Pager - [phone]

## 2017-05-25 ENCOUNTER — Other Ambulatory Visit: Payer: Self-pay

## 2017-05-25 DIAGNOSIS — Z9862 Peripheral vascular angioplasty status: Secondary | ICD-10-CM

## 2017-05-25 LAB — CBC
HCT: 33.7 % — ABNORMAL LOW (ref 36.0–46.0)
Hemoglobin: 10.2 g/dL — ABNORMAL LOW (ref 12.0–15.0)
MCH: 26.1 pg (ref 26.0–34.0)
MCHC: 30.3 g/dL (ref 30.0–36.0)
MCV: 86.2 fL (ref 78.0–100.0)
Platelets: 231 10*3/uL (ref 150–400)
RBC: 3.91 MIL/uL (ref 3.87–5.11)
RDW: 16.1 % — ABNORMAL HIGH (ref 11.5–15.5)
WBC: 7 10*3/uL (ref 4.0–10.5)

## 2017-05-25 LAB — GLUCOSE, CAPILLARY
Glucose-Capillary: 145 mg/dL — ABNORMAL HIGH (ref 65–99)
Glucose-Capillary: 155 mg/dL — ABNORMAL HIGH (ref 65–99)
Glucose-Capillary: 103 mg/dL — ABNORMAL HIGH (ref 65–99)
Glucose-Capillary: 202 mg/dL — ABNORMAL HIGH (ref 65–99)
Glucose-Capillary: 197 mg/dL — ABNORMAL HIGH (ref 65–99)

## 2017-05-25 LAB — COMPREHENSIVE METABOLIC PANEL
ALT: 13 U/L — ABNORMAL LOW (ref 14–54)
AST: 16 U/L (ref 15–41)
Albumin: 3.1 g/dL — ABNORMAL LOW (ref 3.5–5.0)
Alkaline Phosphatase: 67 U/L (ref 38–126)
Anion gap: 9 (ref 5–15)
BUN: 25 mg/dL — ABNORMAL HIGH (ref 6–20)
CO2: 27 mmol/L (ref 22–32)
Calcium: 9.3 mg/dL (ref 8.9–10.3)
Chloride: 101 mmol/L (ref 101–111)
Creatinine, Ser: 1.23 mg/dL — ABNORMAL HIGH (ref 0.44–1.00)
GFR calc Af Amer: 51 mL/min — ABNORMAL LOW (ref >60)
GFR calc non Af Amer: 44 mL/min — ABNORMAL LOW (ref >60)
Glucose, Bld: 171 mg/dL — ABNORMAL HIGH (ref 65–99)
Potassium: 4.3 mmol/L (ref 3.5–5.1)
Sodium: 137 mmol/L (ref 135–145)
Total Bilirubin: 0.5 mg/dL (ref 0.3–1.2)
Total Protein: 5.8 g/dL — ABNORMAL LOW (ref 6.5–8.1)

## 2017-05-25 NOTE — Progress Notes (Signed)
CARDIAC REHAB PHASE I   PRE:  Rate/Rhythm: 97 SR  BP:  Supine:   Sitting: 99/61  Standing:    SaO2: 100% 2L  MODE:  Ambulation: 80 ft In room and then to bathroom  POST:  Rate/Rhythm: 70  BP:  Supine:   Sitting: 113/55  Standing:   SaO2: 100% 2L 1315-1354 Pt stated she walked in room earlier and would prefer to do that again. Pt walked about 80 ft in room and then to bathroom. C/o feeling a little dizzy and we assisted to sitting on side of bed. BP 113/55.  Reinforced taking plavix for stent.  Pt walked with rolling walker on 2L of oxygen and asst x 2. No c/o CP.   Graylon Good, RN BSN  05/25/2017 1:50 PM

## 2017-05-25 NOTE — Progress Notes (Signed)
Progress Note  Patient Name: Stephanie Combs Date of Encounter: 05/25/2017  Primary Cardiologist: Stephanie Dolly, MD wants to see Stephanie Combs   Subjective   Pain all the time thinks it's indigestion Nerves are bad Indicates not being able To go home no heat in house   Inpatient Medications    Scheduled Meds: . aspirin EC  81 mg Oral Daily  . atorvastatin  80 mg Oral q1800  . cholecalciferol  5,000 Units Oral Daily  . clopidogrel  75 mg Oral Daily  . famotidine  20 mg Oral QHS  . feeding supplement (PRO-STAT SUGAR FREE 64)  30 mL Oral BID  . ferrous sulfate  325 mg Oral Q breakfast  . furosemide  40 mg Oral Daily  . gabapentin  300 mg Oral TID  . Gerhardt's butt cream   Topical TID  . heparin  5,000 Units Subcutaneous Q8H  . insulin aspart  0-5 Units Subcutaneous QHS  . insulin aspart  0-9 Units Subcutaneous TID WC  . insulin detemir  52 Units Subcutaneous QHS  . isosorbide mononitrate  30 mg Oral Daily  . levothyroxine  224 mcg Oral QAC breakfast  . metoprolol tartrate  25 mg Oral BID  . mupirocin ointment  1 application Nasal BID  . nystatin   Topical BID  . pantoprazole  40 mg Oral Daily  . polyethylene glycol  17 g Oral Daily  . sodium chloride flush  3 mL Intravenous Q12H  . sodium chloride flush  3 mL Intravenous Q12H  . topiramate  75 mg Oral Daily  . umeclidinium-vilanterol  1 puff Inhalation Daily   Continuous Infusions: . sodium chloride    . sodium chloride     PRN Meds: sodium chloride, acetaminophen **OR** acetaminophen, albuterol, ALPRAZolam, ondansetron (ZOFRAN) IV, oxyCODONE-acetaminophen **AND** oxyCODONE, polyethylene glycol, sodium chloride flush, sodium chloride flush, tiZANidine   Vital Signs    Vitals:   05/24/17 1913 05/24/17 2140 05/25/17 0245 05/25/17 0515  BP: (!) 110/38 (!) 117/59  (!) 134/42  Pulse: 68 61  (!) 58  Resp: 18   18  Temp: 98.3 F (36.8 C)   98.5 F (36.9 C)  TempSrc: Oral   Oral  SpO2: 100%   100%  Weight:   272  lb 6.4 oz (123.6 kg)   Height:        Intake/Output Summary (Last 24 hours) at 05/25/2017 0759 Last data filed at 05/25/2017 0542 Gross per 24 hour  Intake 600 ml  Output 1050 ml  Net -450 ml   Filed Weights   05/24/17 0626 05/24/17 1445 05/25/17 0245  Weight: 274 lb 0.5 oz (124.3 kg) 274 lb 7.6 oz (124.5 kg) 272 lb 6.4 oz (123.6 kg)    Telemetry    Sinus- Personally Reviewed  Physical Exam   Affect appropriate Obese white female  HEENT: normal Neck supple with no adenopathy JVP normal no bruits no thyromegaly Lungs clear with no wheezing and good diaphragmatic motion Heart:  S1/S2 no murmur, no rub, gallop or click PMI normal Abdomen: benighn, BS positve, no tenderness, no AAA no bruit.  No HSM or HJR Distal pulses intact with no bruits No edema Neuro non-focal Skin warm and dry No muscular weakness    Labs    Chemistry Recent Labs  Lab 05/20/17 0853  05/23/17 0552 05/24/17 0225 05/25/17 0626  NA 135   < > 137 135 137  K 3.9   < > 4.5 4.4 4.3  CL 101   < >  104 101 101  CO2 27   < > 28 25 27   GLUCOSE 178*   < > 161* 261* 171*  BUN 19   < > 22* 25* 25*  CREATININE 1.05*   < > 1.28* 1.43* 1.23*  CALCIUM 8.8*   < > 9.1 8.8* 9.3  PROT 6.0*  --   --  5.3* 5.8*  ALBUMIN 2.9*  --   --  2.8* 3.1*  AST 16  --   --  14* 16  ALT 10*  --   --  11* 13*  ALKPHOS 74  --   --  65 67  BILITOT 0.5  --   --  0.5 0.5  GFRNONAA 54*   < > 42* 37* 44*  GFRAA >60   < > 49* 43* 51*  ANIONGAP 7   < > 5 9 9    < > = values in this interval not displayed.     Hematology Recent Labs  Lab 05/23/17 0552 05/24/17 0225 05/25/17 0626  WBC 6.3 6.3 7.0  RBC 4.01 3.45* 3.91  HGB 10.4* 9.2* 10.2*  HCT 34.7* 29.6* 33.7*  MCV 86.5 85.8 86.2  MCH 25.9* 26.7 26.1  MCHC 30.0 31.1 30.3  RDW 15.9* 15.8* 16.1*  PLT 244 228 231     Patient Profile     67 year old female with past medical history of chronic diastolic congestive heart failure, diabetes mellitus, hypertension,  hyperlipidemia, home O2 dependent COPD for evaluation of chest pain and abnormal nuclear study. Enzymes negative. Nuclear study shows normal LV function and prior apical infarct with peri-infarct ischemia. Echocardiogram shows normal LV function, biatrial enlargement and mild right ventricular enlargement.  Cardiac catheterization reveals ostial 65 LAD, 70 followed by 65 prox LAD and first diagonal of 80%.    Assessment & Plan    1 chest pain-Pt s/p DES to LAD and PTCA of diagonal; stable with non cardiac pain now Continue medical Rx will arrange f/u with Stephanie Combs in [place]   2 acute on chronic diastolic congestive heart failure-continue lasix 40 mg daily; follow renal function.  3 acute on chronic stage III kidney disease- K 4.3 Cr 1.23 stable   4 hypertension-blood pressure controlled; continue present meds.  5 home O2 dependent COPD   Will sign off ok to d/c when primary service able f/u Stephanie Combs   For questions or updates, please contact Stephanie Combs Please consult [email or web address] for contact info under Cardiology/STEMI.      Signed, Stephanie Rouge, MD  05/25/2017, 7:59 AM

## 2017-05-25 NOTE — Progress Notes (Signed)
Patient ID: Stephanie Combs, female   DOB: Jul 11, 1950, 67 y.o.   MRN: [id number]                                                                PROGRESS NOTE                                                                                                                                                                                                             Patient Demographics:    Stephanie Combs, is a 67 y.o. female, DOB - Mar 31, 1951, VHQ:[id number]  Admit date - 05/15/2017   Admitting Physician [name], MD  Outpatient Primary MD for the patient is [name], MD  LOS - 10  Outpatient Specialists:    No chief complaint on file.      Brief Narrative   67 y.o.female,w hypothyroidism, hypertension, CHF (EF 65%), Copd Dm2 w neuropathy apparently c/o increase in bilateral lower ext edema, + weight gain , dyspnea, orthopnea, as well as chest pain "burning" and squeezing and radiation to the left arm for the past 2 week. Intermittent. Last had chest pain last nite. Pt requested direct admission for chest pain and dyspnea and lower ext edema.      Subjective:    Stephanie Combs today has no chest pain.  Denies fever, chills, cough, dyspnea, palp, n/v, diarrhea.  Pt states that her electicity at her house is not working and therefore unable to go home today.   No headache,  No abdominal pain - No Nausea, No new weakness tingling or numbness,    Assessment  & Plan :    Active Problems:   DM type 2 (diabetes mellitus, type 2) (HCC)   Dyspnea   Edema   Abnormal nuclear stress test   Atypical angina (HCC)    Chest pain, nuclear stress test =>intermediate risk S/p Cardiac cath 05/19/2017=>LAD lesions S/p cardiac cath 05/22/2017=> intervension with angioplasty to LM and DES to LAD Cont aspirin Cont Lipitor 80mg  po qhs Cont Cardizem Cont imdur Cont plavix Cont lasix Cardiology consult, appreciated   Renal insufficiency cmp in am  Dyspnea/ wheezing likely mild Copd  exacerbation ContAnoro Contalbuterol neb tid unable to go home today due to lack of home o2  LE edema (failed outpatient diuresis), CHF (diastolic) Cont lasix 40mg   po qday Cmp in am  Dm2/neuropathy fsbs ac and qhs iss Increase levemir to 52 units 05/23/2017 Hypothyroidism Cont levothyroxine  Anemia Check cbc in am  Abdominal wound, 3x2cm pink granulation tissue Continue wound care  Code Status:DNR  Family Communication :w patient  Disposition Plan:home  Barriers For Discharge: no home o2 available to due lack of electricity  Consults :cardiology consult  Procedures :   DVT Prophylaxis: Heparin    RecentLabs       Lab Results  Component Value Date   PLT 228 05/24/2017      Antibiotics  :  None      Lab Results  Component Value Date   PLT 231 05/25/2017   Anti-infectives (From admission, onward)   None        Objective:   Vitals:   05/24/17 1913 05/24/17 2140 05/25/17 0245 05/25/17 0515  BP: (!) 110/38 (!) 117/59  (!) 134/42  Pulse: 68 61  (!) 58  Resp: 18   18  Temp: 98.3 F (36.8 C)   98.5 F (36.9 C)  TempSrc: Oral   Oral  SpO2: 100%   100%  Weight:   123.6 kg (272 lb 6.4 oz)   Height:        Wt Readings from Last 3 Encounters:  05/25/17 123.6 kg (272 lb 6.4 oz)  02/02/17 133.4 kg (294 lb 1.5 oz)  01/26/17 (!) 141.1 kg (311 lb)     Intake/Output Summary (Last 24 hours) at 05/25/2017 0804 Last data filed at 05/25/2017 0542 Gross per 24 hour  Intake 600 ml  Output 1050 ml  Net -450 ml     Physical Exam  Awake Alert, Oriented X 3, No new F.N deficits, Normal affect [place].AT,PERRAL Supple Neck,No JVD, No cervical lymphadenopathy appriciated.  Symmetrical Chest wall movement, Good air movement bilaterally, CTAB RRR,No Gallops,Rubs or new Murmurs, No Parasternal Heave +ve B.Sounds, Abd Soft, No tenderness, No organomegaly appriciated, No rebound - guarding or rigidity. No Cyanosis,  Clubbing or edema, No new Rash or bruise    Wound stable    Data Review:    CBC Recent Labs  Lab 05/21/17 0309 05/22/17 0315 05/23/17 0552 05/24/17 0225 05/25/17 0626  WBC 5.5 6.1 6.3 6.3 7.0  HGB 10.6* 9.8* 10.4* 9.2* 10.2*  HCT 34.3* 33.2* 34.7* 29.6* 33.7*  PLT 255 229 244 228 231  MCV 86.4 86.0 86.5 85.8 86.2  MCH 26.7 25.4* 25.9* 26.7 26.1  MCHC 30.9 29.5* 30.0 31.1 30.3  RDW 15.9* 15.8* 15.9* 15.8* 16.1*    Chemistries  Recent Labs  Lab 05/19/17 0622 05/20/17 0853 05/21/17 0309 05/22/17 0315 05/23/17 0552 05/24/17 0225 05/25/17 0626  NA 135 135 136 134* 137 135 137  K 4.1 3.9 4.7 4.3 4.5 4.4 4.3  CL 102 101 103 99* 104 101 101  CO2 27 27 29 29 28 25 27   GLUCOSE 176* 178* 240* 144* 161* 261* 171*  BUN 25* 19 24* 26* 22* 25* 25*  CREATININE 1.06* 1.05* 1.37* 1.41* 1.28* 1.43* 1.23*  CALCIUM 8.8* 8.8* 8.9 9.0 9.1 8.8* 9.3  AST 13* 16  --   --   --  14* 16  ALT 10* 10*  --   --   --  11* 13*  ALKPHOS 71 74  --   --   --  65 67  BILITOT 0.5 0.5  --   --   --  0.5 0.5   ------------------------------------------------------------------------------------------------------------------ No results for input(s): CHOL, HDL, LDLCALC, TRIG, CHOLHDL,  LDLDIRECT in the last 72 hours.  Lab Results  Component Value Date   HGBA1C 7.7 (H) 05/15/2017   ------------------------------------------------------------------------------------------------------------------ No results for input(s): TSH, T4TOTAL, T3FREE, THYROIDAB in the last 72 hours.  Invalid input(s): FREET3 ------------------------------------------------------------------------------------------------------------------ No results for input(s): VITAMINB12, FOLATE, FERRITIN, TIBC, IRON, RETICCTPCT in the last 72 hours.  Coagulation profile Recent Labs  Lab 05/22/17 1324  INR 0.94    No results for input(s): DDIMER in the last 72 hours.  Cardiac Enzymes No results for input(s): CKMB, TROPONINI,  MYOGLOBIN in the last 168 hours.  Invalid input(s): CK ------------------------------------------------------------------------------------------------------------------    Component Value Date/Time   BNP 108.0 (H) 05/01/2017 1000    Inpatient Medications  Scheduled Meds: . aspirin EC  81 mg Oral Daily  . atorvastatin  80 mg Oral q1800  . cholecalciferol  5,000 Units Oral Daily  . clopidogrel  75 mg Oral Daily  . famotidine  20 mg Oral QHS  . feeding supplement (PRO-STAT SUGAR FREE 64)  30 mL Oral BID  . ferrous sulfate  325 mg Oral Q breakfast  . furosemide  40 mg Oral Daily  . gabapentin  300 mg Oral TID  . Gerhardt's butt cream   Topical TID  . heparin  5,000 Units Subcutaneous Q8H  . insulin aspart  0-5 Units Subcutaneous QHS  . insulin aspart  0-9 Units Subcutaneous TID WC  . insulin detemir  52 Units Subcutaneous QHS  . isosorbide mononitrate  30 mg Oral Daily  . levothyroxine  224 mcg Oral QAC breakfast  . metoprolol tartrate  25 mg Oral BID  . mupirocin ointment  1 application Nasal BID  . nystatin   Topical BID  . pantoprazole  40 mg Oral Daily  . polyethylene glycol  17 g Oral Daily  . sodium chloride flush  3 mL Intravenous Q12H  . sodium chloride flush  3 mL Intravenous Q12H  . topiramate  75 mg Oral Daily  . umeclidinium-vilanterol  1 puff Inhalation Daily   Continuous Infusions: . sodium chloride    . sodium chloride     PRN Meds:.sodium chloride, acetaminophen **OR** acetaminophen, albuterol, ALPRAZolam, ondansetron (ZOFRAN) IV, oxyCODONE-acetaminophen **AND** oxyCODONE, polyethylene glycol, sodium chloride flush, sodium chloride flush, tiZANidine  Micro Results Recent Results (from the past 240 hour(s))  Culture, Urine     Status: None   Collection Time: 05/15/17  9:19 PM  Result Value Ref Range Status   Specimen Description URINE, CLEAN CATCH  Final   Special Requests NONE  Final   Culture   Final    NO GROWTH Performed at [facility],  [place] [address], [place], [place] [address]    Report Status 05/17/2017 FINAL  Final  MRSA PCR Screening     Status: Abnormal   Collection Time: 05/20/17  4:20 AM  Result Value Ref Range Status   MRSA by PCR POSITIVE (A) NEGATIVE Final    Comment:        The GeneXpert MRSA Assay (FDA approved for NASAL specimens only), is one component of a comprehensive MRSA colonization surveillance program. It is not intended to diagnose MRSA infection nor to guide or monitor treatment for MRSA infections. RESULT CALLED TO, READ BACK BY AND VERIFIED WITH: Verdene Rio RN 9:50 05/20/17 ([email or web address])     Radiology Reports Nm Myocar Multi W/spect W/wall Motion / Ef  Result Date: 05/17/2017  There was no ST segment deviation noted during stress.  Findings consistent with prior medium to large apical myocardial infarction  with moderate peri-infarct ischemia. Prior small basal septal infarct.  This is an intermediate risk study. Borderline TID of 1.29 may suggest higher risk and balanced ischemia  The left ventricular ejection fraction is normal (55-65%).    Dg Chest Portable 1 View  Result Date: 05/16/2017 CLINICAL DATA:  Chest pain EXAM: PORTABLE CHEST 1 VIEW COMPARISON:  01/30/2017 FINDINGS: Patient rotation limits evaluation. Heart size and pulmonary vascularity are normal. Linear atelectasis in the lung bases, slightly more pronounced than on previous study. No developing consolidation or airspace disease. No pneumothorax. IMPRESSION: Shallow inspiration with linear atelectasis in the lung bases. Electronically Signed   By: Lucienne Capers M.D.   On: 05/16/2017 00:59    Time Spent in minutes  30   [name] M.D on 05/25/2017 at 8:04 AM  Between 7am to 7pm - Pager - [phone]  After 7pm go to [email or web address] - password [facility]  [facility] -  Office  [phone]

## 2017-05-25 NOTE — Consult Note (Addendum)
   [facility] CM Inpatient Consult   05/25/2017  Stephanie Combs 02/24/1951 [id number]  Patient screened for potential [place] [facility] services. Patient is in the [facility] of the [place] [facility] services under patient's [employer]  plan. Patient will be followed by [facility]. Met with the patient at the bedside.  She endorses Dr. [name] as her primary care provider. She gets her oxygen from [place] but her medications she gets from [facility] in [place].  She denies any issues with transportation or medications.  She states she needs a bariatric scale because she had trouble with using the tele-monitoring scale she had returned to [facility].  She states she will look in her benefits book an get them to send one.  She accepted a brochure and 24 hour nurse advise line magnet and encourage to call for needs. She also states she is going to apply for Medicaid again and needs her billing from her hospitalizations and ambulance services she used.  Offered to give her the phone number to [facility] patient billing but she declined stating, "I just hate holding for long periods of time." Patient's primary care provider is listed to provide the transition of care calls and follow up.  Patient will receive EMMI COPD follow up calls.    For questions contact:   Natividad Brood, RN BSN [facility] Liaison  [phone] business mobile phone Toll free office [phone]  1030 am: Patient states she gets wound care from [facility] and home health aide. Follow up with inpatient RNCM.

## 2017-05-25 NOTE — Care Management Note (Signed)
Case Management Note  Patient Details  Name: Stephanie Combs MRN: [id number] Date of Birth: 12-11-1950  Subjective/Objective:  CHF                Action/Plan: Patient lives at home alone; PCP is Dr [name]; has private insurance with [facility] with prescription drug coverage; pharmacy of choice is [facility], they deliver medication to her home; she is active with Nanine Means for [facility] as prior to admission; home oxygen through [place]; She stated that the electricity is off at her home but is expecting it to be back on by today or tomorrow; she states that she has a back up plan, spare 02 tanks, kerosine  Heater; patient voiced no concerns or complaints; CM will continue to follow for progression of care.  Expected Discharge Date:  Possibly 05/26/2017            Expected Discharge Plan:  [place]  Discharge planning Services  CM Consult  HH Arranged:  Nurse's Aide [facility] Agency:  Nanine Means)  Status of Service:  In process, will continue to follow  Sherrilyn Rist [phone] 05/25/2017, 9:56 AM

## 2017-05-26 LAB — BASIC METABOLIC PANEL
Anion gap: 9 (ref 5–15)
BUN: 32 mg/dL — ABNORMAL HIGH (ref 6–20)
CO2: 26 mmol/L (ref 22–32)
Calcium: 9.1 mg/dL (ref 8.9–10.3)
Chloride: 100 mmol/L — ABNORMAL LOW (ref 101–111)
Creatinine, Ser: 1.47 mg/dL — ABNORMAL HIGH (ref 0.44–1.00)
GFR calc Af Amer: 41 mL/min — ABNORMAL LOW (ref >60)
GFR calc non Af Amer: 36 mL/min — ABNORMAL LOW (ref >60)
Glucose, Bld: 136 mg/dL — ABNORMAL HIGH (ref 65–99)
Potassium: 4.4 mmol/L (ref 3.5–5.1)
Sodium: 135 mmol/L (ref 135–145)

## 2017-05-26 LAB — GLUCOSE, CAPILLARY
Glucose-Capillary: 233 mg/dL — ABNORMAL HIGH (ref 65–99)
Glucose-Capillary: 152 mg/dL — ABNORMAL HIGH (ref 65–99)

## 2017-05-26 MED ORDER — FUROSEMIDE 40 MG PO TABS: 40.0000 mg | ORAL_TABLET | Freq: Every day | ORAL | 0 refills | Status: DC

## 2017-05-26 MED ORDER — ATORVASTATIN CALCIUM 80 MG PO TABS: 80.0000 mg | ORAL_TABLET | Freq: Every day | ORAL | 0 refills | Status: DC

## 2017-05-26 MED ORDER — ISOSORBIDE MONONITRATE ER 30 MG PO TB24: 30.0000 mg | ORAL_TABLET | Freq: Every day | ORAL | 0 refills | Status: DC

## 2017-05-26 MED ORDER — GERHARDT'S BUTT CREAM: 1.0000 "application " | TOPICAL_CREAM | Freq: Three times a day (TID) | CUTANEOUS | 0 refills | Status: DC

## 2017-05-26 MED ORDER — POLYETHYLENE GLYCOL 3350 17 G PO PACK: 17.0000 g | PACK | Freq: Every day | ORAL | 0 refills | Status: DC | PRN

## 2017-05-26 MED ORDER — INSULIN ASPART 100 UNIT/ML ~~LOC~~ SOLN: 0.0000 [IU] | Freq: Every day | SUBCUTANEOUS | 11 refills | Status: DC

## 2017-05-26 MED ORDER — INSULIN DETEMIR 100 UNIT/ML ~~LOC~~ SOLN: 52.0000 [IU] | Freq: Every day | SUBCUTANEOUS | 11 refills | Status: DC

## 2017-05-26 MED ORDER — CLOPIDOGREL BISULFATE 75 MG PO TABS: 75.0000 mg | ORAL_TABLET | Freq: Every day | ORAL | 0 refills | Status: DC

## 2017-05-26 MED ORDER — INSULIN ASPART 100 UNIT/ML ~~LOC~~ SOLN: 0.0000 [IU] | Freq: Three times a day (TID) | SUBCUTANEOUS | 11 refills | Status: DC

## 2017-05-26 MED ORDER — METOPROLOL TARTRATE 25 MG PO TABS: 25.0000 mg | ORAL_TABLET | Freq: Two times a day (BID) | ORAL | 0 refills | Status: DC

## 2017-05-26 MED ORDER — UMECLIDINIUM-VILANTEROL 62.5-25 MCG/INH IN AEPB: 1.0000 | INHALATION_SPRAY | Freq: Every day | RESPIRATORY_TRACT | 0 refills | Status: DC

## 2017-05-26 NOTE — Discharge Summary (Addendum)
Stephanie Combs, is a 67 y.o. female  DOB [birth year]  MRN [id number].  Admission date:  05/15/2017  Admitting Physician  [name], MD  Discharge Date:  05/26/2017   Primary MD  [name], MD  Recommendations for primary care physician for things to follow:    Chest pain, nuclear stress test =>intermediate risk S/p Cardiac cath 05/19/2017=>LAD lesions S/p cardiac cath 05/22/2017=> intervension with angioplasty to LM and DES to LAD Cont aspirin Cont Lipitor 80mg  po qhs Cont metoprolol Cont imdur Cont plavix Cont lasix Cardiology consult, appreciated check cmp in 1 week   Renal insufficiency cmp in 1 week  Dyspnea/ wheezing likely mild Copd exacerbation ContAnoro Contalbuterol HFA prn  Cont home o2  LE edema (failed outpatient diuresis), Acute CHF (diastolic) Cont lasix 40mg  po qday Needs home health RN to check vital signs and educated patient   Dm2/neuropathy fsbs ac and qhs iss Increase levemir to 52 units1/04/2018  Hypothyroidism Cont levothyroxine  Anemia Cont iron Check cbc in 1 week  Abdominal wound, 3x2cm pink granulation tissue Continue wound care per home health       Admission Diagnosis  dyspnea   Discharge Diagnosis  dyspnea      Active Problems:   DM type 2 (diabetes mellitus, type 2) (HCC)   Dyspnea   Edema   Abnormal nuclear stress test   Atypical angina [facility])      Past Medical History:  Diagnosis Date  . Anxiety   . CHF (congestive heart failure) (HCC)    Diastolic  . Chronic back pain   . COPD (chronic obstructive pulmonary disease) ([place])   . Degenerative disk disease   . Diabetes mellitus without complication ([place])   . Hypertension   . Hypothyroidism   . On home O2    2.5 L N/C prn  . Pedal edema     Past Surgical History:  Procedure Laterality Date  . CORONARY STENT INTERVENTION N/A 05/22/2017   Procedure: CORONARY  STENT INTERVENTION;  Surgeon: Martinique, Peter M, MD;  Location: [facility];  Service: Cardiovascular;  Laterality: N/A;  . HERNIA REPAIR    . RIGHT/LEFT HEART CATH AND CORONARY ANGIOGRAPHY N/A 05/19/2017   Procedure: RIGHT/LEFT HEART CATH AND CORONARY ANGIOGRAPHY;  Surgeon: Leonie Man, MD;  Location: [facility];  Service: Cardiovascular;  Laterality: N/A;  . TUBAL LIGATION         HPI  from the history and physical done on the day of admission:     67 y.o.female,w hypothyroidism, hypertension, CHF (EF 65%), Copd Dm2 w neuropathy apparently c/o increase in bilateral lower ext edema, + weight gain , dyspnea, orthopnea, as well as chest pain "burning" and squeezing and radiation to the left arm for the past 2 week. Intermittent. Last had chest pain last nite. Pt requested direct admission for chest pain and dyspnea and lower ext edema.        Hospital Course:      Pt was admitted for diuresis with  iv lasix as she had failed outpatient diuretics for LE edema.  Pt c/o dyspnea as well as chest pain.  Though CXR 05/15/2017=> linear atelectasis in the lung base, cardiology thought might have a component of CHF. Her dyspnea was probably a combination of fluid overload as well as Copd. Pt was tx with IV lasix initially 40mg  iv bid and then transitioned to lasix 40mg  po qday.  Due to her Cp, pt had   Cardiac echo 05/16/2017=>  EF 60-65%, and grade1 diastolic dysfunction and mild to moderate pulmonary hypertension.    Nuclear stress test 05/17/2017 =>intermediate risk  Cardiac catheterization 05/19/2017=>   Ost LAD lesion is 65% stenosed.  Prox LAD-1 lesion is 70% stenosed with 80% stenosed side branch in Ost 1st Diag.  Prox LAD-2 lesion is 60% stenosed.  LV end diastolic pressure is mildly elevated.  There is no aortic valve stenosis.  Hemodynamic findings consistent with mild pulmonary hypertension.  Cardiac catheterization 05/22/2017=>  Ost LAD lesion is 55%  stenosed.  Prox LAD lesion is 95% stenosed with 80% stenosed side branch in Ost 1st Diag.  A drug-eluting stent was successfully placed using a STENT SYNERGY DES 2.5X20.  Post intervention, there is a 0% residual stenosis.  Post intervention, the side branch was reduced to 20% residual stenosis.  Pt was having some post procedure chest discomfort but this is thought to be atypical chest pain by cardiology.  Pt breathing has been doing well.  Pt has been restarted on lasix 40mg  po qday and will need f/u of renal insufficiency as well as cholesterol. Pt appears stable and will be discharged to home.     Follow UP  Follow-up Information    [place] [place] Follow up.   Specialty:  [place] Why:  They will continue to do your home health care at your home Contact information: [name] [address] [phone]        [name], MD Follow up in 1 week(s).   Specialty:  Internal Medicine Contact information: [address] [place] [address] [phone]        Arnoldo Lenis, MD Follow up in 1 week(s).   Specialty:  Cardiology Contact information: [address] [name] [address] [phone]            Consults obtained - cardiology  Discharge Condition: stable  Diet and Activity recommendation: See Discharge Instructions below  Discharge Instructions         Discharge Medications     Allergies as of 05/26/2017      Reactions   Dilaudid [hydromorphone Hcl] Itching   Actifed Cold-allergy [chlorpheniramine-phenylephrine]    "I was sick and red and it didn't agree with me at all"   Doxycycline Nausea And Vomiting   Other Cough   Pt states she is allergic to ragweed and that she starts coughing and sneezing like crazy   Penicillins Hives, Itching   Tolerates Rocephin Has patient had a PCN reaction causing immediate rash, facial/tongue/throat swelling, SOB or  lightheadedness with hypotension: Yes Has patient had a PCN reaction causing severe rash involving mucus membranes or skin necrosis: No Has patient had a PCN reaction that required hospitalization Yes Has patient had a PCN reaction occurring within the last 10 years: No If all of the above answers are "NO", then may proceed with Cephalosporin use.   Reglan [metoclopramide] Itching   Valium [diazepam] Itching   Vistaril [hydroxyzine Hcl]  Itching      Medication List    STOP taking these medications   diltiazem 300 MG 24 hr capsule Commonly known as:  CARDIZEM CD   potassium chloride 10 MEQ tablet Commonly known as:  K-DUR   ranitidine 150 MG tablet Commonly known as:  ZANTAC     TAKE these medications   albuterol 108 (90 Base) MCG/ACT inhaler Commonly known as:  PROAIR HFA Inhale 1 puff into the lungs every 4 (four) hours as needed. For shortness of breath What changed:    how much to take  additional instructions   ALPRAZolam 1 MG tablet Commonly known as:  XANAX Take 1 mg by mouth 3 (three) times daily as needed for anxiety or sleep.   aspirin EC 81 MG tablet Take 81 mg by mouth daily.   atorvastatin 80 MG tablet Commonly known as:  LIPITOR Take 1 tablet (80 mg total) by mouth daily at 6 PM.   clopidogrel 75 MG tablet Commonly known as:  PLAVIX Take 1 tablet (75 mg total) by mouth daily.   feeding supplement (PRO-STAT SUGAR FREE 64) Liqd Take 30 mLs by mouth 2 (two) times daily.   ferrous sulfate 325 (65 FE) MG tablet Take 1 tablet (325 mg total) by mouth daily with breakfast.   FISH OIL PO Take 1 capsule by mouth daily.   furosemide 40 MG tablet Commonly known as:  LASIX Take 1 tablet (40 mg total) by mouth daily. What changed:    medication strength  how much to take  when to take this   gabapentin 300 MG capsule Commonly known as:  NEURONTIN Take 1 capsule (300 mg total) by mouth 3 (three) times daily.   Gerhardt's butt cream Crea Apply 1  application topically 3 (three) times daily.   insulin aspart 100 UNIT/ML injection Commonly known as:  novoLOG Inject 0-5 Units into the skin at bedtime.   insulin aspart 100 UNIT/ML injection Commonly known as:  novoLOG Inject 0-9 Units into the skin 3 (three) times daily with meals.   insulin detemir 100 UNIT/ML injection Commonly known as:  LEVEMIR Inject 0.52 mLs (52 Units total) into the skin at bedtime. What changed:    how much to take  how to take this  when to take this  additional instructions   insulin lispro 100 UNIT/ML injection Commonly known as:  HUMALOG INJECT 10-16 UNITS 3 TIMES DAILY BEFORE MEALS   isosorbide mononitrate 30 MG 24 hr tablet Commonly known as:  IMDUR Take 1 tablet (30 mg total) by mouth daily.   levothyroxine 112 MCG tablet Commonly known as:  SYNTHROID, LEVOTHROID Take 224 mcg by mouth daily before breakfast.   lidocaine-prilocaine cream Commonly known as:  EMLA Apply 1 application topically as needed. What changed:  when to take this   metoprolol tartrate 25 MG tablet Commonly known as:  LOPRESSOR Take 1 tablet (25 mg total) by mouth 2 (two) times daily.   nystatin powder Commonly known as:  MYCOSTATIN/NYSTOP Apply topically 2 (two) times daily.   ondansetron 4 MG tablet Commonly known as:  ZOFRAN Take 4 mg by mouth every 8 (eight) hours as needed for vomiting.   oxyCODONE-acetaminophen 10-325 MG tablet Commonly known as:  PERCOCET Take 1 tablet by mouth every 6 (six) hours as needed for pain.   pantoprazole 40 MG tablet Commonly known as:  PROTONIX Take 1 tablet (40 mg total) by mouth daily.   polyethylene glycol packet Commonly known as:  MIRALAX / GLYCOLAX  Take 17 g by mouth daily as needed for mild constipation or moderate constipation.   silver sulfADIAZINE 1 % cream Commonly known as:  SILVADENE Apply 1 application topically daily.   SURE COMFORT INS SYR 1CC/28G 28G X 1/2" 1 ML Misc Generic drug:  INS  SYRINGE/NEEDLE 1CC/28G USE TO INJECT INSULIN UP TO 4 TIMES DAILY.   tiZANidine 4 MG tablet Commonly known as:  ZANAFLEX Take 4 mg by mouth every 8 (eight) hours as needed for muscle spasms.   topiramate 25 MG tablet Commonly known as:  TOPAMAX Take 75 mg by mouth daily.   triamcinolone cream 0.1 % Commonly known as:  KENALOG Apply 1 application topically 4 (four) times daily.   umeclidinium-vilanterol 62.5-25 MCG/INH Aepb Commonly known as:  ANORO ELLIPTA Inhale 1 puff into the lungs daily.   VICTOZA 18 MG/3ML Sopn Generic drug:  liraglutide Inject 1.2 mg into the skin daily.   Vitamin D3 5000 units Caps Take 1 capsule (5,000 Units total) by mouth daily.       Major procedures and Radiology Reports - PLEASE review detailed and final reports for all details, in brief -      Nm Myocar Multi W/spect W/wall Motion / Ef  Result Date: 05/17/2017  There was no ST segment deviation noted during stress.  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 Prior small basal septal infarct.  This is an intermediate risk study. Borderline TID of 1.29 may suggest higher risk and balanced ischemia  The left ventricular ejection fraction is normal (55-65%).    Dg Chest Portable 1 View  Result Date: 05/16/2017 CLINICAL DATA:  Chest pain EXAM: PORTABLE CHEST 1 VIEW COMPARISON:  01/30/2017 FINDINGS: Patient rotation limits evaluation. Heart size and pulmonary vascularity are normal. Linear atelectasis in the lung bases, slightly more pronounced than on previous study. No developing consolidation or airspace disease. No pneumothorax. IMPRESSION: Shallow inspiration with linear atelectasis in the lung bases. Electronically Signed   By: Lucienne Capers M.D.   On: 05/16/2017 00:59    Micro Results     Recent Results (from the past 240 hour(s))  MRSA PCR Screening     Status: Abnormal   Collection Time: 05/20/17  4:20 AM   Result Value Ref Range Status   MRSA by PCR POSITIVE (A) NEGATIVE Final    Comment:        The GeneXpert MRSA Assay (FDA approved for NASAL specimens only), is one component of a comprehensive MRSA colonization surveillance program. It is not intended to diagnose MRSA infection nor to guide or monitor treatment for MRSA infections. RESULT CALLED TO, READ BACK BY AND VERIFIED WITH: Verdene Rio RN 9:50 05/20/17 ([email or web address])        Today   Subjective    [name] today has no headache,no chest abdominal pain,no new weakness tingling or numbness, feels much better wants to go home today.   Objective   Blood pressure (!) 108/45, pulse 61, temperature 98.3 F (36.8 C), temperature source Oral, resp. rate 18, height 5\' 6"  (1.676 m), weight 124.6 kg (274 lb 9.6 oz), SpO2 100 %.   Intake/Output Summary (Last 24 hours) at 05/26/2017 0814 Last data filed at 05/25/2017 2200 Gross per 24 hour  Intake 120 ml  Output 800 ml  Net -680 ml    Exam Awake Alert, Oriented x 3, No new F.N deficits, Normal affect [place].AT,PERRAL Supple Neck,No JVD, No cervical lymphadenopathy appriciated.  Symmetrical Chest wall movement, Good  air movement bilaterally, CTAB RRR,No Gallops,Rubs or new Murmurs, No Parasternal Heave +ve B.Sounds, Abd Soft, Non tender, No organomegaly appriciated, No rebound -guarding or rigidity. No Cyanosis, Clubbing or edema, No new Rash or bruise  Wound stable on mid abdomen   Data Review   CBC w Diff:  Lab Results  Component Value Date   WBC 7.0 05/25/2017   HGB 10.2 (L) 05/25/2017   HCT 33.7 (L) 05/25/2017   HCT 31.0 (L) 02/01/2017   PLT 231 05/25/2017   LYMPHOPCT 19 04/27/2017   MONOPCT 6 04/27/2017   EOSPCT 2 04/27/2017   BASOPCT 1 04/27/2017    CMP:  Lab Results  Component Value Date   NA 135 05/26/2017   K 4.4 05/26/2017   CL 100 (L) 05/26/2017   CO2 26 05/26/2017   BUN 32 (H) 05/26/2017   BUN 28 (A) 11/11/2016   CREATININE 1.47 (H) 05/26/2017    PROT 5.8 (L) 05/25/2017   ALBUMIN 3.1 (L) 05/25/2017   BILITOT 0.5 05/25/2017   ALKPHOS 67 05/25/2017   AST 16 05/25/2017   ALT 13 (L) 05/25/2017  .   Total Time in preparing paper work, data evaluation and todays exam - 66 minutes  [name] M.D on 05/26/2017 at [place]  [phone]

## 2017-05-26 NOTE — Progress Notes (Signed)
Pt discharge instructions reviewed with pt. Pt verbalizes understanding. Pt belongings with pt. Pt is not in distress. Pt is waiting for her family member to pick her up. RN applied ace wrap to pt's bilateral legs (foot to knee) and changed the pink foam on pt's sacrum.

## 2017-05-28 DIAGNOSIS — I130 Hypertensive heart and chronic kidney disease with heart failure and stage 1 through stage 4 chronic kidney disease, or unspecified chronic kidney disease: Secondary | ICD-10-CM | POA: Diagnosis not present

## 2017-05-28 DIAGNOSIS — E1122 Type 2 diabetes mellitus with diabetic chronic kidney disease: Secondary | ICD-10-CM | POA: Diagnosis not present

## 2017-05-28 DIAGNOSIS — L304 Erythema intertrigo: Secondary | ICD-10-CM | POA: Diagnosis not present

## 2017-05-28 DIAGNOSIS — J449 Chronic obstructive pulmonary disease, unspecified: Secondary | ICD-10-CM | POA: Diagnosis not present

## 2017-05-28 DIAGNOSIS — Z79891 Long term (current) use of opiate analgesic: Secondary | ICD-10-CM | POA: Diagnosis not present

## 2017-05-28 DIAGNOSIS — Z4800 Encounter for change or removal of nonsurgical wound dressing: Secondary | ICD-10-CM | POA: Diagnosis not present

## 2017-05-28 DIAGNOSIS — L98492 Non-pressure chronic ulcer of skin of other sites with fat layer exposed: Secondary | ICD-10-CM | POA: Diagnosis not present

## 2017-05-28 DIAGNOSIS — Z9981 Dependence on supplemental oxygen: Secondary | ICD-10-CM | POA: Diagnosis not present

## 2017-05-28 DIAGNOSIS — Z7982 Long term (current) use of aspirin: Secondary | ICD-10-CM | POA: Diagnosis not present

## 2017-05-28 DIAGNOSIS — L89152 Pressure ulcer of sacral region, stage 2: Secondary | ICD-10-CM | POA: Diagnosis not present

## 2017-05-28 DIAGNOSIS — Z794 Long term (current) use of insulin: Secondary | ICD-10-CM | POA: Diagnosis not present

## 2017-05-28 DIAGNOSIS — N183 Chronic kidney disease, stage 3 (moderate): Secondary | ICD-10-CM | POA: Diagnosis not present

## 2017-05-28 DIAGNOSIS — Z79810 Long term (current) use of selective estrogen receptor modulators (SERMs): Secondary | ICD-10-CM | POA: Diagnosis not present

## 2017-05-28 DIAGNOSIS — D631 Anemia in chronic kidney disease: Secondary | ICD-10-CM | POA: Diagnosis not present

## 2017-05-28 DIAGNOSIS — I5032 Chronic diastolic (congestive) heart failure: Secondary | ICD-10-CM | POA: Diagnosis not present

## 2017-06-01 DIAGNOSIS — L98492 Non-pressure chronic ulcer of skin of other sites with fat layer exposed: Secondary | ICD-10-CM | POA: Diagnosis not present

## 2017-06-01 DIAGNOSIS — Z79810 Long term (current) use of selective estrogen receptor modulators (SERMs): Secondary | ICD-10-CM | POA: Diagnosis not present

## 2017-06-01 DIAGNOSIS — Z9981 Dependence on supplemental oxygen: Secondary | ICD-10-CM | POA: Diagnosis not present

## 2017-06-01 DIAGNOSIS — E1122 Type 2 diabetes mellitus with diabetic chronic kidney disease: Secondary | ICD-10-CM | POA: Diagnosis not present

## 2017-06-01 DIAGNOSIS — Z794 Long term (current) use of insulin: Secondary | ICD-10-CM | POA: Diagnosis not present

## 2017-06-01 DIAGNOSIS — Z79891 Long term (current) use of opiate analgesic: Secondary | ICD-10-CM | POA: Diagnosis not present

## 2017-06-01 DIAGNOSIS — I5032 Chronic diastolic (congestive) heart failure: Secondary | ICD-10-CM | POA: Diagnosis not present

## 2017-06-01 DIAGNOSIS — L304 Erythema intertrigo: Secondary | ICD-10-CM | POA: Diagnosis not present

## 2017-06-01 DIAGNOSIS — Z7982 Long term (current) use of aspirin: Secondary | ICD-10-CM | POA: Diagnosis not present

## 2017-06-01 DIAGNOSIS — I130 Hypertensive heart and chronic kidney disease with heart failure and stage 1 through stage 4 chronic kidney disease, or unspecified chronic kidney disease: Secondary | ICD-10-CM | POA: Diagnosis not present

## 2017-06-01 DIAGNOSIS — Z4800 Encounter for change or removal of nonsurgical wound dressing: Secondary | ICD-10-CM | POA: Diagnosis not present

## 2017-06-01 DIAGNOSIS — J449 Chronic obstructive pulmonary disease, unspecified: Secondary | ICD-10-CM | POA: Diagnosis not present

## 2017-06-01 DIAGNOSIS — L89152 Pressure ulcer of sacral region, stage 2: Secondary | ICD-10-CM | POA: Diagnosis not present

## 2017-06-01 DIAGNOSIS — D631 Anemia in chronic kidney disease: Secondary | ICD-10-CM | POA: Diagnosis not present

## 2017-06-01 DIAGNOSIS — N183 Chronic kidney disease, stage 3 (moderate): Secondary | ICD-10-CM | POA: Diagnosis not present

## 2017-06-02 DIAGNOSIS — Z9981 Dependence on supplemental oxygen: Secondary | ICD-10-CM | POA: Diagnosis not present

## 2017-06-02 DIAGNOSIS — D631 Anemia in chronic kidney disease: Secondary | ICD-10-CM | POA: Diagnosis not present

## 2017-06-02 DIAGNOSIS — I130 Hypertensive heart and chronic kidney disease with heart failure and stage 1 through stage 4 chronic kidney disease, or unspecified chronic kidney disease: Secondary | ICD-10-CM | POA: Diagnosis not present

## 2017-06-02 DIAGNOSIS — I5032 Chronic diastolic (congestive) heart failure: Secondary | ICD-10-CM | POA: Diagnosis not present

## 2017-06-02 DIAGNOSIS — L304 Erythema intertrigo: Secondary | ICD-10-CM | POA: Diagnosis not present

## 2017-06-02 DIAGNOSIS — L89152 Pressure ulcer of sacral region, stage 2: Secondary | ICD-10-CM | POA: Diagnosis not present

## 2017-06-02 DIAGNOSIS — Z794 Long term (current) use of insulin: Secondary | ICD-10-CM | POA: Diagnosis not present

## 2017-06-02 DIAGNOSIS — Z79810 Long term (current) use of selective estrogen receptor modulators (SERMs): Secondary | ICD-10-CM | POA: Diagnosis not present

## 2017-06-02 DIAGNOSIS — E1122 Type 2 diabetes mellitus with diabetic chronic kidney disease: Secondary | ICD-10-CM | POA: Diagnosis not present

## 2017-06-02 DIAGNOSIS — Z4800 Encounter for change or removal of nonsurgical wound dressing: Secondary | ICD-10-CM | POA: Diagnosis not present

## 2017-06-02 DIAGNOSIS — Z7982 Long term (current) use of aspirin: Secondary | ICD-10-CM | POA: Diagnosis not present

## 2017-06-02 DIAGNOSIS — N183 Chronic kidney disease, stage 3 (moderate): Secondary | ICD-10-CM | POA: Diagnosis not present

## 2017-06-02 DIAGNOSIS — J449 Chronic obstructive pulmonary disease, unspecified: Secondary | ICD-10-CM | POA: Diagnosis not present

## 2017-06-02 DIAGNOSIS — L98492 Non-pressure chronic ulcer of skin of other sites with fat layer exposed: Secondary | ICD-10-CM | POA: Diagnosis not present

## 2017-06-02 DIAGNOSIS — Z79891 Long term (current) use of opiate analgesic: Secondary | ICD-10-CM | POA: Diagnosis not present

## 2017-06-03 DIAGNOSIS — Z4800 Encounter for change or removal of nonsurgical wound dressing: Secondary | ICD-10-CM | POA: Diagnosis not present

## 2017-06-03 DIAGNOSIS — Z9981 Dependence on supplemental oxygen: Secondary | ICD-10-CM | POA: Diagnosis not present

## 2017-06-03 DIAGNOSIS — E1122 Type 2 diabetes mellitus with diabetic chronic kidney disease: Secondary | ICD-10-CM | POA: Diagnosis not present

## 2017-06-03 DIAGNOSIS — N183 Chronic kidney disease, stage 3 (moderate): Secondary | ICD-10-CM | POA: Diagnosis not present

## 2017-06-03 DIAGNOSIS — J449 Chronic obstructive pulmonary disease, unspecified: Secondary | ICD-10-CM | POA: Diagnosis not present

## 2017-06-03 DIAGNOSIS — D631 Anemia in chronic kidney disease: Secondary | ICD-10-CM | POA: Diagnosis not present

## 2017-06-03 DIAGNOSIS — Z79810 Long term (current) use of selective estrogen receptor modulators (SERMs): Secondary | ICD-10-CM | POA: Diagnosis not present

## 2017-06-03 DIAGNOSIS — I130 Hypertensive heart and chronic kidney disease with heart failure and stage 1 through stage 4 chronic kidney disease, or unspecified chronic kidney disease: Secondary | ICD-10-CM | POA: Diagnosis not present

## 2017-06-03 DIAGNOSIS — L304 Erythema intertrigo: Secondary | ICD-10-CM | POA: Diagnosis not present

## 2017-06-03 DIAGNOSIS — Z794 Long term (current) use of insulin: Secondary | ICD-10-CM | POA: Diagnosis not present

## 2017-06-03 DIAGNOSIS — I5032 Chronic diastolic (congestive) heart failure: Secondary | ICD-10-CM | POA: Diagnosis not present

## 2017-06-03 DIAGNOSIS — L89152 Pressure ulcer of sacral region, stage 2: Secondary | ICD-10-CM | POA: Diagnosis not present

## 2017-06-03 DIAGNOSIS — Z79891 Long term (current) use of opiate analgesic: Secondary | ICD-10-CM | POA: Diagnosis not present

## 2017-06-03 DIAGNOSIS — L98492 Non-pressure chronic ulcer of skin of other sites with fat layer exposed: Secondary | ICD-10-CM | POA: Diagnosis not present

## 2017-06-03 DIAGNOSIS — Z7982 Long term (current) use of aspirin: Secondary | ICD-10-CM | POA: Diagnosis not present

## 2017-06-03 NOTE — Progress Notes (Deleted)
Cardiology Office Note    Date:  06/03/2017   ID:  Stephanie Combs, DOB May 15, 1950, MRN [id number]  PCP:  [name], MD  Cardiologist: Dr. Harl Bowie   No chief complaint on file.   History of Present Illness:    Stephanie Combs is a 67 y.o. female ***    Past Medical History:  Diagnosis Date  . Anxiety   . CHF (congestive heart failure) (HCC)    Diastolic  . Chronic back pain   . COPD (chronic obstructive pulmonary disease) ([place])   . Degenerative disk disease   . Diabetes mellitus without complication ([place])   . Hypertension   . Hypothyroidism   . On home O2    2.5 L N/C prn  . Pedal edema     Past Surgical History:  Procedure Laterality Date  . CORONARY STENT INTERVENTION N/A 05/22/2017   Procedure: CORONARY STENT INTERVENTION;  Surgeon: Martinique, Peter M, MD;  Location: [facility];  Service: Cardiovascular;  Laterality: N/A;  . HERNIA REPAIR    . RIGHT/LEFT HEART CATH AND CORONARY ANGIOGRAPHY N/A 05/19/2017   Procedure: RIGHT/LEFT HEART CATH AND CORONARY ANGIOGRAPHY;  Surgeon: Leonie Man, MD;  Location: [facility];  Service: Cardiovascular;  Laterality: N/A;  . TUBAL LIGATION      Current Medications: Outpatient Medications Prior to Visit  Medication Sig Dispense Refill  . albuterol (PROAIR HFA) 108 (90 BASE) MCG/ACT inhaler Inhale 1 puff into the lungs every 4 (four) hours as needed. For shortness of breath (Patient taking differently: Inhale 2 puffs into the lungs every 4 (four) hours as needed. For shortness of breath) 1 Inhaler 1  . ALPRAZolam (XANAX) 1 MG tablet Take 1 mg by mouth 3 (three) times daily as needed for anxiety or sleep.     . Amino Acids-Protein Hydrolys (FEEDING SUPPLEMENT, PRO-STAT SUGAR FREE 64,) LIQD Take 30 mLs by mouth 2 (two) times daily. 900 mL 0  . aspirin EC 81 MG tablet Take 81 mg by mouth daily.    Marland Kitchen atorvastatin (LIPITOR) 80 MG tablet Take 1 tablet (80 mg total) by mouth daily at 6 PM. 30 tablet 0  . Cholecalciferol  (VITAMIN D3) 5000 units CAPS Take 1 capsule (5,000 Units total) by mouth daily. 90 capsule 0  . clopidogrel (PLAVIX) 75 MG tablet Take 1 tablet (75 mg total) by mouth daily. 30 tablet 0  . ferrous sulfate 325 (65 FE) MG tablet Take 1 tablet (325 mg total) by mouth daily with breakfast. 30 tablet 3  . furosemide (LASIX) 40 MG tablet Take 1 tablet (40 mg total) by mouth daily. 30 tablet 0  . gabapentin (NEURONTIN) 300 MG capsule Take 1 capsule (300 mg total) by mouth 3 (three) times daily. 90 capsule 0  . Hydrocortisone (GERHARDT'S BUTT CREAM) CREA Apply 1 application topically 3 (three) times daily. 90 each 0  . insulin aspart (NOVOLOG) 100 UNIT/ML injection Inject 0-5 Units into the skin at bedtime. 10 mL 11  . insulin aspart (NOVOLOG) 100 UNIT/ML injection Inject 0-9 Units into the skin 3 (three) times daily with meals. 10 mL 11  . insulin detemir (LEVEMIR) 100 UNIT/ML injection Inject 0.52 mLs (52 Units total) into the skin at bedtime. 10 mL 11  . insulin lispro (HUMALOG) 100 UNIT/ML injection INJECT 10-16 UNITS 3 TIMES DAILY BEFORE MEALS 10 mL 0  . isosorbide mononitrate (IMDUR) 30 MG 24 hr tablet Take 1 tablet (30 mg total) by mouth daily. 30 tablet 0  .  levothyroxine (SYNTHROID, LEVOTHROID) 112 MCG tablet Take 224 mcg by mouth daily before breakfast.     . lidocaine-prilocaine (EMLA) cream Apply 1 application topically as needed. (Patient taking differently: Apply 1 application topically daily as needed. ) 30 g 0  . metoprolol tartrate (LOPRESSOR) 25 MG tablet Take 1 tablet (25 mg total) by mouth 2 (two) times daily. 60 tablet 0  . nystatin (MYCOSTATIN/NYSTOP) powder Apply topically 2 (two) times daily. 15 g 0  . Omega-3 Fatty Acids (FISH OIL PO) Take 1 capsule by mouth daily.     . ondansetron (ZOFRAN) 4 MG tablet Take 4 mg by mouth every 8 (eight) hours as needed for vomiting.     Marland Kitchen oxyCODONE-acetaminophen (PERCOCET) 10-325 MG tablet Take 1 tablet by mouth every 6 (six) hours as needed for  pain.    . pantoprazole (PROTONIX) 40 MG tablet Take 1 tablet (40 mg total) by mouth daily. 30 tablet 0  . polyethylene glycol (MIRALAX / GLYCOLAX) packet Take 17 g by mouth daily as needed for mild constipation or moderate constipation. 14 each 0  . silver sulfADIAZINE (SILVADENE) 1 % cream Apply 1 application topically daily.    . SURE COMFORT INS SYR 1CC/28G 28G X 1/2" 1 ML MISC USE TO INJECT INSULIN UP TO 4 TIMES DAILY. 150 each 5  . tiZANidine (ZANAFLEX) 4 MG tablet Take 4 mg by mouth every 8 (eight) hours as needed for muscle spasms.     Marland Kitchen topiramate (TOPAMAX) 25 MG tablet Take 75 mg by mouth daily.     Marland Kitchen triamcinolone cream (KENALOG) 0.1 % Apply 1 application topically 4 (four) times daily.     Marland Kitchen umeclidinium-vilanterol (ANORO ELLIPTA) 62.5-25 MCG/INH AEPB Inhale 1 puff into the lungs daily. 30 each 0  . VICTOZA 18 MG/3ML SOPN Inject 1.2 mg into the skin daily.      No facility-administered medications prior to visit.      Allergies:   Dilaudid [hydromorphone hcl]; Actifed cold-allergy [chlorpheniramine-phenylephrine]; Doxycycline; Other; Penicillins; Reglan [metoclopramide]; Valium [diazepam]; and Vistaril [hydroxyzine hcl]   Social History   Socioeconomic History  . Marital status: Widowed    Spouse name: Not on file  . Number of children: Not on file  . Years of education: Not on file  . Highest education level: Not on file  Social Needs  . Financial resource strain: Not on file  . Food insecurity - worry: Not on file  . Food insecurity - inability: Not on file  . Transportation needs - medical: Not on file  . Transportation needs - non-medical: Not on file  Occupational History  . Not on file  Tobacco Use  . Smoking status: Former Smoker    Packs/day: 0.25    Types: Cigarettes    Last attempt to quit: 06/27/2015    Years since quitting: 1.9  . Smokeless tobacco: Never Used  Substance and Sexual Activity  . Alcohol use: No    Alcohol/week: 0.0 oz  . Drug use: No  .  Sexual activity: Not Currently    Birth control/protection: Surgical  Other Topics Concern  . Not on file  Social History Narrative  . Not on file     Family History:  The patient's ***family history includes Cerebral palsy in her brother; Diabetes in her brother and other; Heart attack in her father and other; Pneumonia in her brother; Stroke in her mother.   Review of Systems:   Please see the history of present illness.     General:  No chills, fever, night sweats or weight changes.  Cardiovascular:  No chest pain, dyspnea on exertion, edema, orthopnea, palpitations, paroxysmal nocturnal dyspnea. Dermatological: No rash, lesions/masses Respiratory: No cough, dyspnea Urologic: No hematuria, dysuria Abdominal:   No nausea, vomiting, diarrhea, bright red blood per rectum, melena, or hematemesis Neurologic:  No visual changes, wkns, changes in mental status. All other systems reviewed and are otherwise negative except as noted above.   Physical Exam:    VS:  There were no vitals taken for this visit.   General: Well developed, well nourished,female appearing in no acute distress. Head: Normocephalic, atraumatic, sclera non-icteric, no xanthomas, nares are without discharge.  Neck: No carotid bruits. JVD not elevated.  Lungs: Respirations regular and unlabored, without wheezes or rales.  Heart: ***Regular rate and rhythm. No S3 or S4.  No murmur, no rubs, or gallops appreciated. Abdomen: Soft, non-tender, non-distended with normoactive bowel sounds. No hepatomegaly. No rebound/guarding. No obvious abdominal masses. Msk:  Strength and tone appear normal for age. No joint deformities or effusions. Extremities: No clubbing or cyanosis. No edema.  Distal pedal pulses are 2+ bilaterally. Neuro: Alert and oriented X 3. Moves all extremities spontaneously. No focal deficits noted. Psych:  Responds to questions appropriately with a normal affect. Skin: No rashes or lesions noted  Wt  Readings from Last 3 Encounters:  05/26/17 274 lb 9.6 oz (124.6 kg)  02/02/17 294 lb 1.5 oz (133.4 kg)  01/26/17 (!) 311 lb (141.1 kg)        Studies/Labs Reviewed:   EKG:  EKG is*** ordered today.  The ekg ordered today demonstrates ***  Recent Labs: 05/01/2017: B Natriuretic Peptide 108.0 05/15/2017: TSH 0.320 05/25/2017: ALT 13; Hemoglobin 10.2; Platelets 231 05/26/2017: BUN 32; Creatinine, Ser 1.47; Potassium 4.4; Sodium 135   Lipid Panel    Component Value Date/Time   CHOL 164 05/19/2017 0622   TRIG 104 05/19/2017 0622   HDL 36 (L) 05/19/2017 0622   CHOLHDL 4.6 05/19/2017 0622   VLDL 21 05/19/2017 0622   LDLCALC 107 (H) 05/19/2017 0622    Additional studies/ records that were reviewed today include:  ***  Assessment:    No diagnosis found.   Plan:   In order of problems listed above:  1. ***    Medication Adjustments/Labs and Tests Ordered: Current medicines are reviewed at length with the patient today.  Concerns regarding medicines are outlined above.  Medication changes, Labs and Tests ordered today are listed in the Patient Instructions below. There are no Patient Instructions on file for this visit.   Signed, Erma Heritage, PA-C  06/03/2017 10:24 AM    [facility] [address] [place], [place] [address] Phone: [phone]

## 2017-06-04 ENCOUNTER — Ambulatory Visit: Payer: Medicare Other | Admitting: Student

## 2017-06-04 DIAGNOSIS — Z7982 Long term (current) use of aspirin: Secondary | ICD-10-CM | POA: Diagnosis not present

## 2017-06-04 DIAGNOSIS — Z794 Long term (current) use of insulin: Secondary | ICD-10-CM | POA: Diagnosis not present

## 2017-06-04 DIAGNOSIS — L89152 Pressure ulcer of sacral region, stage 2: Secondary | ICD-10-CM | POA: Diagnosis not present

## 2017-06-04 DIAGNOSIS — D631 Anemia in chronic kidney disease: Secondary | ICD-10-CM | POA: Diagnosis not present

## 2017-06-04 DIAGNOSIS — Z79891 Long term (current) use of opiate analgesic: Secondary | ICD-10-CM | POA: Diagnosis not present

## 2017-06-04 DIAGNOSIS — L304 Erythema intertrigo: Secondary | ICD-10-CM | POA: Diagnosis not present

## 2017-06-04 DIAGNOSIS — Z79810 Long term (current) use of selective estrogen receptor modulators (SERMs): Secondary | ICD-10-CM | POA: Diagnosis not present

## 2017-06-04 DIAGNOSIS — N183 Chronic kidney disease, stage 3 (moderate): Secondary | ICD-10-CM | POA: Diagnosis not present

## 2017-06-04 DIAGNOSIS — I130 Hypertensive heart and chronic kidney disease with heart failure and stage 1 through stage 4 chronic kidney disease, or unspecified chronic kidney disease: Secondary | ICD-10-CM | POA: Diagnosis not present

## 2017-06-04 DIAGNOSIS — E1122 Type 2 diabetes mellitus with diabetic chronic kidney disease: Secondary | ICD-10-CM | POA: Diagnosis not present

## 2017-06-04 DIAGNOSIS — Z4800 Encounter for change or removal of nonsurgical wound dressing: Secondary | ICD-10-CM | POA: Diagnosis not present

## 2017-06-04 DIAGNOSIS — J449 Chronic obstructive pulmonary disease, unspecified: Secondary | ICD-10-CM | POA: Diagnosis not present

## 2017-06-04 DIAGNOSIS — L98492 Non-pressure chronic ulcer of skin of other sites with fat layer exposed: Secondary | ICD-10-CM | POA: Diagnosis not present

## 2017-06-04 DIAGNOSIS — Z9981 Dependence on supplemental oxygen: Secondary | ICD-10-CM | POA: Diagnosis not present

## 2017-06-04 DIAGNOSIS — I5032 Chronic diastolic (congestive) heart failure: Secondary | ICD-10-CM | POA: Diagnosis not present

## 2017-06-05 DIAGNOSIS — I130 Hypertensive heart and chronic kidney disease with heart failure and stage 1 through stage 4 chronic kidney disease, or unspecified chronic kidney disease: Secondary | ICD-10-CM | POA: Diagnosis not present

## 2017-06-05 DIAGNOSIS — Z7982 Long term (current) use of aspirin: Secondary | ICD-10-CM | POA: Diagnosis not present

## 2017-06-05 DIAGNOSIS — Z9981 Dependence on supplemental oxygen: Secondary | ICD-10-CM | POA: Diagnosis not present

## 2017-06-05 DIAGNOSIS — Z4800 Encounter for change or removal of nonsurgical wound dressing: Secondary | ICD-10-CM | POA: Diagnosis not present

## 2017-06-05 DIAGNOSIS — N183 Chronic kidney disease, stage 3 (moderate): Secondary | ICD-10-CM | POA: Diagnosis not present

## 2017-06-05 DIAGNOSIS — Z79891 Long term (current) use of opiate analgesic: Secondary | ICD-10-CM | POA: Diagnosis not present

## 2017-06-05 DIAGNOSIS — L89152 Pressure ulcer of sacral region, stage 2: Secondary | ICD-10-CM | POA: Diagnosis not present

## 2017-06-05 DIAGNOSIS — J449 Chronic obstructive pulmonary disease, unspecified: Secondary | ICD-10-CM | POA: Diagnosis not present

## 2017-06-05 DIAGNOSIS — E1122 Type 2 diabetes mellitus with diabetic chronic kidney disease: Secondary | ICD-10-CM | POA: Diagnosis not present

## 2017-06-05 DIAGNOSIS — L304 Erythema intertrigo: Secondary | ICD-10-CM | POA: Diagnosis not present

## 2017-06-05 DIAGNOSIS — I5032 Chronic diastolic (congestive) heart failure: Secondary | ICD-10-CM | POA: Diagnosis not present

## 2017-06-05 DIAGNOSIS — Z79810 Long term (current) use of selective estrogen receptor modulators (SERMs): Secondary | ICD-10-CM | POA: Diagnosis not present

## 2017-06-05 DIAGNOSIS — Z794 Long term (current) use of insulin: Secondary | ICD-10-CM | POA: Diagnosis not present

## 2017-06-05 DIAGNOSIS — D631 Anemia in chronic kidney disease: Secondary | ICD-10-CM | POA: Diagnosis not present

## 2017-06-05 DIAGNOSIS — L98492 Non-pressure chronic ulcer of skin of other sites with fat layer exposed: Secondary | ICD-10-CM | POA: Diagnosis not present

## 2017-06-08 DIAGNOSIS — N183 Chronic kidney disease, stage 3 (moderate): Secondary | ICD-10-CM | POA: Diagnosis not present

## 2017-06-08 DIAGNOSIS — I130 Hypertensive heart and chronic kidney disease with heart failure and stage 1 through stage 4 chronic kidney disease, or unspecified chronic kidney disease: Secondary | ICD-10-CM | POA: Diagnosis not present

## 2017-06-08 DIAGNOSIS — Z4800 Encounter for change or removal of nonsurgical wound dressing: Secondary | ICD-10-CM | POA: Diagnosis not present

## 2017-06-08 DIAGNOSIS — L98492 Non-pressure chronic ulcer of skin of other sites with fat layer exposed: Secondary | ICD-10-CM | POA: Diagnosis not present

## 2017-06-08 DIAGNOSIS — L89152 Pressure ulcer of sacral region, stage 2: Secondary | ICD-10-CM | POA: Diagnosis not present

## 2017-06-08 DIAGNOSIS — E1122 Type 2 diabetes mellitus with diabetic chronic kidney disease: Secondary | ICD-10-CM | POA: Diagnosis not present

## 2017-06-08 DIAGNOSIS — Z7982 Long term (current) use of aspirin: Secondary | ICD-10-CM | POA: Diagnosis not present

## 2017-06-08 DIAGNOSIS — Z9981 Dependence on supplemental oxygen: Secondary | ICD-10-CM | POA: Diagnosis not present

## 2017-06-08 DIAGNOSIS — Z79891 Long term (current) use of opiate analgesic: Secondary | ICD-10-CM | POA: Diagnosis not present

## 2017-06-08 DIAGNOSIS — L304 Erythema intertrigo: Secondary | ICD-10-CM | POA: Diagnosis not present

## 2017-06-08 DIAGNOSIS — J449 Chronic obstructive pulmonary disease, unspecified: Secondary | ICD-10-CM | POA: Diagnosis not present

## 2017-06-08 DIAGNOSIS — I5032 Chronic diastolic (congestive) heart failure: Secondary | ICD-10-CM | POA: Diagnosis not present

## 2017-06-08 DIAGNOSIS — Z79810 Long term (current) use of selective estrogen receptor modulators (SERMs): Secondary | ICD-10-CM | POA: Diagnosis not present

## 2017-06-08 DIAGNOSIS — D631 Anemia in chronic kidney disease: Secondary | ICD-10-CM | POA: Diagnosis not present

## 2017-06-08 DIAGNOSIS — Z794 Long term (current) use of insulin: Secondary | ICD-10-CM | POA: Diagnosis not present

## 2017-06-09 ENCOUNTER — Encounter: Payer: Self-pay | Admitting: Internal Medicine

## 2017-06-09 DIAGNOSIS — I2510 Atherosclerotic heart disease of native coronary artery without angina pectoris: Secondary | ICD-10-CM | POA: Diagnosis not present

## 2017-06-09 DIAGNOSIS — E039 Hypothyroidism, unspecified: Secondary | ICD-10-CM | POA: Diagnosis not present

## 2017-06-09 DIAGNOSIS — E1140 Type 2 diabetes mellitus with diabetic neuropathy, unspecified: Secondary | ICD-10-CM | POA: Diagnosis not present

## 2017-06-09 DIAGNOSIS — I10 Essential (primary) hypertension: Secondary | ICD-10-CM | POA: Diagnosis not present

## 2017-06-10 DIAGNOSIS — Z79810 Long term (current) use of selective estrogen receptor modulators (SERMs): Secondary | ICD-10-CM | POA: Diagnosis not present

## 2017-06-10 DIAGNOSIS — L98492 Non-pressure chronic ulcer of skin of other sites with fat layer exposed: Secondary | ICD-10-CM | POA: Diagnosis not present

## 2017-06-10 DIAGNOSIS — J449 Chronic obstructive pulmonary disease, unspecified: Secondary | ICD-10-CM | POA: Diagnosis not present

## 2017-06-10 DIAGNOSIS — I130 Hypertensive heart and chronic kidney disease with heart failure and stage 1 through stage 4 chronic kidney disease, or unspecified chronic kidney disease: Secondary | ICD-10-CM | POA: Diagnosis not present

## 2017-06-10 DIAGNOSIS — Z4800 Encounter for change or removal of nonsurgical wound dressing: Secondary | ICD-10-CM | POA: Diagnosis not present

## 2017-06-10 DIAGNOSIS — D631 Anemia in chronic kidney disease: Secondary | ICD-10-CM | POA: Diagnosis not present

## 2017-06-10 DIAGNOSIS — Z9981 Dependence on supplemental oxygen: Secondary | ICD-10-CM | POA: Diagnosis not present

## 2017-06-10 DIAGNOSIS — L304 Erythema intertrigo: Secondary | ICD-10-CM | POA: Diagnosis not present

## 2017-06-10 DIAGNOSIS — Z79891 Long term (current) use of opiate analgesic: Secondary | ICD-10-CM | POA: Diagnosis not present

## 2017-06-10 DIAGNOSIS — E1122 Type 2 diabetes mellitus with diabetic chronic kidney disease: Secondary | ICD-10-CM | POA: Diagnosis not present

## 2017-06-10 DIAGNOSIS — Z7982 Long term (current) use of aspirin: Secondary | ICD-10-CM | POA: Diagnosis not present

## 2017-06-10 DIAGNOSIS — I5032 Chronic diastolic (congestive) heart failure: Secondary | ICD-10-CM | POA: Diagnosis not present

## 2017-06-10 DIAGNOSIS — L89152 Pressure ulcer of sacral region, stage 2: Secondary | ICD-10-CM | POA: Diagnosis not present

## 2017-06-10 DIAGNOSIS — Z794 Long term (current) use of insulin: Secondary | ICD-10-CM | POA: Diagnosis not present

## 2017-06-10 DIAGNOSIS — N183 Chronic kidney disease, stage 3 (moderate): Secondary | ICD-10-CM | POA: Diagnosis not present

## 2017-06-11 DIAGNOSIS — I5032 Chronic diastolic (congestive) heart failure: Secondary | ICD-10-CM | POA: Diagnosis not present

## 2017-06-11 DIAGNOSIS — N183 Chronic kidney disease, stage 3 (moderate): Secondary | ICD-10-CM | POA: Diagnosis not present

## 2017-06-11 DIAGNOSIS — Z7982 Long term (current) use of aspirin: Secondary | ICD-10-CM | POA: Diagnosis not present

## 2017-06-11 DIAGNOSIS — Z794 Long term (current) use of insulin: Secondary | ICD-10-CM | POA: Diagnosis not present

## 2017-06-11 DIAGNOSIS — J449 Chronic obstructive pulmonary disease, unspecified: Secondary | ICD-10-CM | POA: Diagnosis not present

## 2017-06-11 DIAGNOSIS — L89152 Pressure ulcer of sacral region, stage 2: Secondary | ICD-10-CM | POA: Diagnosis not present

## 2017-06-11 DIAGNOSIS — E1122 Type 2 diabetes mellitus with diabetic chronic kidney disease: Secondary | ICD-10-CM | POA: Diagnosis not present

## 2017-06-11 DIAGNOSIS — D631 Anemia in chronic kidney disease: Secondary | ICD-10-CM | POA: Diagnosis not present

## 2017-06-11 DIAGNOSIS — Z79891 Long term (current) use of opiate analgesic: Secondary | ICD-10-CM | POA: Diagnosis not present

## 2017-06-11 DIAGNOSIS — Z79810 Long term (current) use of selective estrogen receptor modulators (SERMs): Secondary | ICD-10-CM | POA: Diagnosis not present

## 2017-06-11 DIAGNOSIS — Z9981 Dependence on supplemental oxygen: Secondary | ICD-10-CM | POA: Diagnosis not present

## 2017-06-11 DIAGNOSIS — I130 Hypertensive heart and chronic kidney disease with heart failure and stage 1 through stage 4 chronic kidney disease, or unspecified chronic kidney disease: Secondary | ICD-10-CM | POA: Diagnosis not present

## 2017-06-11 DIAGNOSIS — L98492 Non-pressure chronic ulcer of skin of other sites with fat layer exposed: Secondary | ICD-10-CM | POA: Diagnosis not present

## 2017-06-11 DIAGNOSIS — L304 Erythema intertrigo: Secondary | ICD-10-CM | POA: Diagnosis not present

## 2017-06-11 DIAGNOSIS — Z4800 Encounter for change or removal of nonsurgical wound dressing: Secondary | ICD-10-CM | POA: Diagnosis not present

## 2017-06-12 DIAGNOSIS — I130 Hypertensive heart and chronic kidney disease with heart failure and stage 1 through stage 4 chronic kidney disease, or unspecified chronic kidney disease: Secondary | ICD-10-CM | POA: Diagnosis not present

## 2017-06-12 DIAGNOSIS — I5032 Chronic diastolic (congestive) heart failure: Secondary | ICD-10-CM | POA: Diagnosis not present

## 2017-06-12 DIAGNOSIS — E1122 Type 2 diabetes mellitus with diabetic chronic kidney disease: Secondary | ICD-10-CM | POA: Diagnosis not present

## 2017-06-12 DIAGNOSIS — Z9981 Dependence on supplemental oxygen: Secondary | ICD-10-CM | POA: Diagnosis not present

## 2017-06-12 DIAGNOSIS — Z4800 Encounter for change or removal of nonsurgical wound dressing: Secondary | ICD-10-CM | POA: Diagnosis not present

## 2017-06-12 DIAGNOSIS — L98492 Non-pressure chronic ulcer of skin of other sites with fat layer exposed: Secondary | ICD-10-CM | POA: Diagnosis not present

## 2017-06-12 DIAGNOSIS — Z794 Long term (current) use of insulin: Secondary | ICD-10-CM | POA: Diagnosis not present

## 2017-06-12 DIAGNOSIS — J449 Chronic obstructive pulmonary disease, unspecified: Secondary | ICD-10-CM | POA: Diagnosis not present

## 2017-06-12 DIAGNOSIS — Z79891 Long term (current) use of opiate analgesic: Secondary | ICD-10-CM | POA: Diagnosis not present

## 2017-06-12 DIAGNOSIS — L304 Erythema intertrigo: Secondary | ICD-10-CM | POA: Diagnosis not present

## 2017-06-12 DIAGNOSIS — N183 Chronic kidney disease, stage 3 (moderate): Secondary | ICD-10-CM | POA: Diagnosis not present

## 2017-06-12 DIAGNOSIS — D631 Anemia in chronic kidney disease: Secondary | ICD-10-CM | POA: Diagnosis not present

## 2017-06-12 DIAGNOSIS — Z7982 Long term (current) use of aspirin: Secondary | ICD-10-CM | POA: Diagnosis not present

## 2017-06-12 DIAGNOSIS — L89152 Pressure ulcer of sacral region, stage 2: Secondary | ICD-10-CM | POA: Diagnosis not present

## 2017-06-12 DIAGNOSIS — Z79810 Long term (current) use of selective estrogen receptor modulators (SERMs): Secondary | ICD-10-CM | POA: Diagnosis not present

## 2017-06-15 DIAGNOSIS — L98492 Non-pressure chronic ulcer of skin of other sites with fat layer exposed: Secondary | ICD-10-CM | POA: Diagnosis not present

## 2017-06-15 DIAGNOSIS — Z4800 Encounter for change or removal of nonsurgical wound dressing: Secondary | ICD-10-CM | POA: Diagnosis not present

## 2017-06-15 DIAGNOSIS — D631 Anemia in chronic kidney disease: Secondary | ICD-10-CM | POA: Diagnosis not present

## 2017-06-15 DIAGNOSIS — Z79810 Long term (current) use of selective estrogen receptor modulators (SERMs): Secondary | ICD-10-CM | POA: Diagnosis not present

## 2017-06-15 DIAGNOSIS — N183 Chronic kidney disease, stage 3 (moderate): Secondary | ICD-10-CM | POA: Diagnosis not present

## 2017-06-15 DIAGNOSIS — L89152 Pressure ulcer of sacral region, stage 2: Secondary | ICD-10-CM | POA: Diagnosis not present

## 2017-06-15 DIAGNOSIS — M545 Low back pain: Secondary | ICD-10-CM | POA: Diagnosis not present

## 2017-06-15 DIAGNOSIS — Z7982 Long term (current) use of aspirin: Secondary | ICD-10-CM | POA: Diagnosis not present

## 2017-06-15 DIAGNOSIS — J449 Chronic obstructive pulmonary disease, unspecified: Secondary | ICD-10-CM | POA: Diagnosis not present

## 2017-06-15 DIAGNOSIS — I5032 Chronic diastolic (congestive) heart failure: Secondary | ICD-10-CM | POA: Diagnosis not present

## 2017-06-15 DIAGNOSIS — Z794 Long term (current) use of insulin: Secondary | ICD-10-CM | POA: Diagnosis not present

## 2017-06-15 DIAGNOSIS — G894 Chronic pain syndrome: Secondary | ICD-10-CM | POA: Diagnosis not present

## 2017-06-15 DIAGNOSIS — L304 Erythema intertrigo: Secondary | ICD-10-CM | POA: Diagnosis not present

## 2017-06-15 DIAGNOSIS — I130 Hypertensive heart and chronic kidney disease with heart failure and stage 1 through stage 4 chronic kidney disease, or unspecified chronic kidney disease: Secondary | ICD-10-CM | POA: Diagnosis not present

## 2017-06-15 DIAGNOSIS — R2689 Other abnormalities of gait and mobility: Secondary | ICD-10-CM | POA: Diagnosis not present

## 2017-06-15 DIAGNOSIS — E1122 Type 2 diabetes mellitus with diabetic chronic kidney disease: Secondary | ICD-10-CM | POA: Diagnosis not present

## 2017-06-15 DIAGNOSIS — Z9981 Dependence on supplemental oxygen: Secondary | ICD-10-CM | POA: Diagnosis not present

## 2017-06-15 DIAGNOSIS — Z79891 Long term (current) use of opiate analgesic: Secondary | ICD-10-CM | POA: Diagnosis not present

## 2017-06-16 DIAGNOSIS — Z7982 Long term (current) use of aspirin: Secondary | ICD-10-CM | POA: Diagnosis not present

## 2017-06-16 DIAGNOSIS — I5032 Chronic diastolic (congestive) heart failure: Secondary | ICD-10-CM | POA: Diagnosis not present

## 2017-06-16 DIAGNOSIS — Z79891 Long term (current) use of opiate analgesic: Secondary | ICD-10-CM | POA: Diagnosis not present

## 2017-06-16 DIAGNOSIS — Z9981 Dependence on supplemental oxygen: Secondary | ICD-10-CM | POA: Diagnosis not present

## 2017-06-16 DIAGNOSIS — D631 Anemia in chronic kidney disease: Secondary | ICD-10-CM | POA: Diagnosis not present

## 2017-06-16 DIAGNOSIS — I130 Hypertensive heart and chronic kidney disease with heart failure and stage 1 through stage 4 chronic kidney disease, or unspecified chronic kidney disease: Secondary | ICD-10-CM | POA: Diagnosis not present

## 2017-06-16 DIAGNOSIS — Z794 Long term (current) use of insulin: Secondary | ICD-10-CM | POA: Diagnosis not present

## 2017-06-16 DIAGNOSIS — N183 Chronic kidney disease, stage 3 (moderate): Secondary | ICD-10-CM | POA: Diagnosis not present

## 2017-06-16 DIAGNOSIS — E1122 Type 2 diabetes mellitus with diabetic chronic kidney disease: Secondary | ICD-10-CM | POA: Diagnosis not present

## 2017-06-16 DIAGNOSIS — L89152 Pressure ulcer of sacral region, stage 2: Secondary | ICD-10-CM | POA: Diagnosis not present

## 2017-06-16 DIAGNOSIS — Z4800 Encounter for change or removal of nonsurgical wound dressing: Secondary | ICD-10-CM | POA: Diagnosis not present

## 2017-06-16 DIAGNOSIS — L98492 Non-pressure chronic ulcer of skin of other sites with fat layer exposed: Secondary | ICD-10-CM | POA: Diagnosis not present

## 2017-06-16 DIAGNOSIS — L304 Erythema intertrigo: Secondary | ICD-10-CM | POA: Diagnosis not present

## 2017-06-16 DIAGNOSIS — J449 Chronic obstructive pulmonary disease, unspecified: Secondary | ICD-10-CM | POA: Diagnosis not present

## 2017-06-16 DIAGNOSIS — Z79810 Long term (current) use of selective estrogen receptor modulators (SERMs): Secondary | ICD-10-CM | POA: Diagnosis not present

## 2017-06-17 DIAGNOSIS — Z79810 Long term (current) use of selective estrogen receptor modulators (SERMs): Secondary | ICD-10-CM | POA: Diagnosis not present

## 2017-06-17 DIAGNOSIS — Z7982 Long term (current) use of aspirin: Secondary | ICD-10-CM | POA: Diagnosis not present

## 2017-06-17 DIAGNOSIS — N183 Chronic kidney disease, stage 3 (moderate): Secondary | ICD-10-CM | POA: Diagnosis not present

## 2017-06-17 DIAGNOSIS — Z794 Long term (current) use of insulin: Secondary | ICD-10-CM | POA: Diagnosis not present

## 2017-06-17 DIAGNOSIS — L89152 Pressure ulcer of sacral region, stage 2: Secondary | ICD-10-CM | POA: Diagnosis not present

## 2017-06-17 DIAGNOSIS — E1122 Type 2 diabetes mellitus with diabetic chronic kidney disease: Secondary | ICD-10-CM | POA: Diagnosis not present

## 2017-06-17 DIAGNOSIS — Z4800 Encounter for change or removal of nonsurgical wound dressing: Secondary | ICD-10-CM | POA: Diagnosis not present

## 2017-06-17 DIAGNOSIS — Z9981 Dependence on supplemental oxygen: Secondary | ICD-10-CM | POA: Diagnosis not present

## 2017-06-17 DIAGNOSIS — Z79891 Long term (current) use of opiate analgesic: Secondary | ICD-10-CM | POA: Diagnosis not present

## 2017-06-17 DIAGNOSIS — L304 Erythema intertrigo: Secondary | ICD-10-CM | POA: Diagnosis not present

## 2017-06-17 DIAGNOSIS — D631 Anemia in chronic kidney disease: Secondary | ICD-10-CM | POA: Diagnosis not present

## 2017-06-17 DIAGNOSIS — I5032 Chronic diastolic (congestive) heart failure: Secondary | ICD-10-CM | POA: Diagnosis not present

## 2017-06-17 DIAGNOSIS — L98492 Non-pressure chronic ulcer of skin of other sites with fat layer exposed: Secondary | ICD-10-CM | POA: Diagnosis not present

## 2017-06-17 DIAGNOSIS — J449 Chronic obstructive pulmonary disease, unspecified: Secondary | ICD-10-CM | POA: Diagnosis not present

## 2017-06-17 DIAGNOSIS — I130 Hypertensive heart and chronic kidney disease with heart failure and stage 1 through stage 4 chronic kidney disease, or unspecified chronic kidney disease: Secondary | ICD-10-CM | POA: Diagnosis not present

## 2017-06-18 ENCOUNTER — Ambulatory Visit: Payer: Medicare Other | Admitting: Student

## 2017-06-19 DIAGNOSIS — L89152 Pressure ulcer of sacral region, stage 2: Secondary | ICD-10-CM | POA: Diagnosis not present

## 2017-06-19 DIAGNOSIS — I5032 Chronic diastolic (congestive) heart failure: Secondary | ICD-10-CM | POA: Diagnosis not present

## 2017-06-19 DIAGNOSIS — E1122 Type 2 diabetes mellitus with diabetic chronic kidney disease: Secondary | ICD-10-CM | POA: Diagnosis not present

## 2017-06-19 DIAGNOSIS — Z4800 Encounter for change or removal of nonsurgical wound dressing: Secondary | ICD-10-CM | POA: Diagnosis not present

## 2017-06-19 DIAGNOSIS — L304 Erythema intertrigo: Secondary | ICD-10-CM | POA: Diagnosis not present

## 2017-06-19 DIAGNOSIS — Z79810 Long term (current) use of selective estrogen receptor modulators (SERMs): Secondary | ICD-10-CM | POA: Diagnosis not present

## 2017-06-19 DIAGNOSIS — Z9981 Dependence on supplemental oxygen: Secondary | ICD-10-CM | POA: Diagnosis not present

## 2017-06-19 DIAGNOSIS — Z79891 Long term (current) use of opiate analgesic: Secondary | ICD-10-CM | POA: Diagnosis not present

## 2017-06-19 DIAGNOSIS — J449 Chronic obstructive pulmonary disease, unspecified: Secondary | ICD-10-CM | POA: Diagnosis not present

## 2017-06-19 DIAGNOSIS — N183 Chronic kidney disease, stage 3 (moderate): Secondary | ICD-10-CM | POA: Diagnosis not present

## 2017-06-19 DIAGNOSIS — Z794 Long term (current) use of insulin: Secondary | ICD-10-CM | POA: Diagnosis not present

## 2017-06-19 DIAGNOSIS — L98492 Non-pressure chronic ulcer of skin of other sites with fat layer exposed: Secondary | ICD-10-CM | POA: Diagnosis not present

## 2017-06-19 DIAGNOSIS — D631 Anemia in chronic kidney disease: Secondary | ICD-10-CM | POA: Diagnosis not present

## 2017-06-19 DIAGNOSIS — I130 Hypertensive heart and chronic kidney disease with heart failure and stage 1 through stage 4 chronic kidney disease, or unspecified chronic kidney disease: Secondary | ICD-10-CM | POA: Diagnosis not present

## 2017-06-19 DIAGNOSIS — Z7982 Long term (current) use of aspirin: Secondary | ICD-10-CM | POA: Diagnosis not present

## 2017-06-22 DIAGNOSIS — Z79891 Long term (current) use of opiate analgesic: Secondary | ICD-10-CM | POA: Diagnosis not present

## 2017-06-22 DIAGNOSIS — J449 Chronic obstructive pulmonary disease, unspecified: Secondary | ICD-10-CM | POA: Diagnosis not present

## 2017-06-22 DIAGNOSIS — N183 Chronic kidney disease, stage 3 (moderate): Secondary | ICD-10-CM | POA: Diagnosis not present

## 2017-06-22 DIAGNOSIS — I5032 Chronic diastolic (congestive) heart failure: Secondary | ICD-10-CM | POA: Diagnosis not present

## 2017-06-22 DIAGNOSIS — L98492 Non-pressure chronic ulcer of skin of other sites with fat layer exposed: Secondary | ICD-10-CM | POA: Diagnosis not present

## 2017-06-22 DIAGNOSIS — Z79810 Long term (current) use of selective estrogen receptor modulators (SERMs): Secondary | ICD-10-CM | POA: Diagnosis not present

## 2017-06-22 DIAGNOSIS — Z4800 Encounter for change or removal of nonsurgical wound dressing: Secondary | ICD-10-CM | POA: Diagnosis not present

## 2017-06-22 DIAGNOSIS — L304 Erythema intertrigo: Secondary | ICD-10-CM | POA: Diagnosis not present

## 2017-06-22 DIAGNOSIS — E1122 Type 2 diabetes mellitus with diabetic chronic kidney disease: Secondary | ICD-10-CM | POA: Diagnosis not present

## 2017-06-22 DIAGNOSIS — I130 Hypertensive heart and chronic kidney disease with heart failure and stage 1 through stage 4 chronic kidney disease, or unspecified chronic kidney disease: Secondary | ICD-10-CM | POA: Diagnosis not present

## 2017-06-22 DIAGNOSIS — Z9981 Dependence on supplemental oxygen: Secondary | ICD-10-CM | POA: Diagnosis not present

## 2017-06-22 DIAGNOSIS — D631 Anemia in chronic kidney disease: Secondary | ICD-10-CM | POA: Diagnosis not present

## 2017-06-22 DIAGNOSIS — Z794 Long term (current) use of insulin: Secondary | ICD-10-CM | POA: Diagnosis not present

## 2017-06-22 DIAGNOSIS — L89152 Pressure ulcer of sacral region, stage 2: Secondary | ICD-10-CM | POA: Diagnosis not present

## 2017-06-22 DIAGNOSIS — Z7982 Long term (current) use of aspirin: Secondary | ICD-10-CM | POA: Diagnosis not present

## 2017-06-23 DIAGNOSIS — N183 Chronic kidney disease, stage 3 (moderate): Secondary | ICD-10-CM | POA: Diagnosis not present

## 2017-06-23 DIAGNOSIS — L98492 Non-pressure chronic ulcer of skin of other sites with fat layer exposed: Secondary | ICD-10-CM | POA: Diagnosis not present

## 2017-06-23 DIAGNOSIS — Z9981 Dependence on supplemental oxygen: Secondary | ICD-10-CM | POA: Diagnosis not present

## 2017-06-23 DIAGNOSIS — I130 Hypertensive heart and chronic kidney disease with heart failure and stage 1 through stage 4 chronic kidney disease, or unspecified chronic kidney disease: Secondary | ICD-10-CM | POA: Diagnosis not present

## 2017-06-23 DIAGNOSIS — Z794 Long term (current) use of insulin: Secondary | ICD-10-CM | POA: Diagnosis not present

## 2017-06-23 DIAGNOSIS — I5032 Chronic diastolic (congestive) heart failure: Secondary | ICD-10-CM | POA: Diagnosis not present

## 2017-06-23 DIAGNOSIS — Z7982 Long term (current) use of aspirin: Secondary | ICD-10-CM | POA: Diagnosis not present

## 2017-06-23 DIAGNOSIS — D631 Anemia in chronic kidney disease: Secondary | ICD-10-CM | POA: Diagnosis not present

## 2017-06-23 DIAGNOSIS — Z79891 Long term (current) use of opiate analgesic: Secondary | ICD-10-CM | POA: Diagnosis not present

## 2017-06-23 DIAGNOSIS — E1122 Type 2 diabetes mellitus with diabetic chronic kidney disease: Secondary | ICD-10-CM | POA: Diagnosis not present

## 2017-06-23 DIAGNOSIS — Z79810 Long term (current) use of selective estrogen receptor modulators (SERMs): Secondary | ICD-10-CM | POA: Diagnosis not present

## 2017-06-23 DIAGNOSIS — L89152 Pressure ulcer of sacral region, stage 2: Secondary | ICD-10-CM | POA: Diagnosis not present

## 2017-06-23 DIAGNOSIS — L304 Erythema intertrigo: Secondary | ICD-10-CM | POA: Diagnosis not present

## 2017-06-23 DIAGNOSIS — Z4800 Encounter for change or removal of nonsurgical wound dressing: Secondary | ICD-10-CM | POA: Diagnosis not present

## 2017-06-23 DIAGNOSIS — J449 Chronic obstructive pulmonary disease, unspecified: Secondary | ICD-10-CM | POA: Diagnosis not present

## 2017-06-24 DIAGNOSIS — E1122 Type 2 diabetes mellitus with diabetic chronic kidney disease: Secondary | ICD-10-CM | POA: Diagnosis not present

## 2017-06-24 DIAGNOSIS — N183 Chronic kidney disease, stage 3 (moderate): Secondary | ICD-10-CM | POA: Diagnosis not present

## 2017-06-24 DIAGNOSIS — Z9981 Dependence on supplemental oxygen: Secondary | ICD-10-CM | POA: Diagnosis not present

## 2017-06-24 DIAGNOSIS — Z79891 Long term (current) use of opiate analgesic: Secondary | ICD-10-CM | POA: Diagnosis not present

## 2017-06-24 DIAGNOSIS — J449 Chronic obstructive pulmonary disease, unspecified: Secondary | ICD-10-CM | POA: Diagnosis not present

## 2017-06-24 DIAGNOSIS — L89152 Pressure ulcer of sacral region, stage 2: Secondary | ICD-10-CM | POA: Diagnosis not present

## 2017-06-24 DIAGNOSIS — Z794 Long term (current) use of insulin: Secondary | ICD-10-CM | POA: Diagnosis not present

## 2017-06-24 DIAGNOSIS — L98492 Non-pressure chronic ulcer of skin of other sites with fat layer exposed: Secondary | ICD-10-CM | POA: Diagnosis not present

## 2017-06-24 DIAGNOSIS — Z79810 Long term (current) use of selective estrogen receptor modulators (SERMs): Secondary | ICD-10-CM | POA: Diagnosis not present

## 2017-06-24 DIAGNOSIS — I130 Hypertensive heart and chronic kidney disease with heart failure and stage 1 through stage 4 chronic kidney disease, or unspecified chronic kidney disease: Secondary | ICD-10-CM | POA: Diagnosis not present

## 2017-06-24 DIAGNOSIS — I5032 Chronic diastolic (congestive) heart failure: Secondary | ICD-10-CM | POA: Diagnosis not present

## 2017-06-24 DIAGNOSIS — Z7982 Long term (current) use of aspirin: Secondary | ICD-10-CM | POA: Diagnosis not present

## 2017-06-24 DIAGNOSIS — L304 Erythema intertrigo: Secondary | ICD-10-CM | POA: Diagnosis not present

## 2017-06-24 DIAGNOSIS — D631 Anemia in chronic kidney disease: Secondary | ICD-10-CM | POA: Diagnosis not present

## 2017-06-24 DIAGNOSIS — Z4800 Encounter for change or removal of nonsurgical wound dressing: Secondary | ICD-10-CM | POA: Diagnosis not present

## 2017-06-25 DIAGNOSIS — L89152 Pressure ulcer of sacral region, stage 2: Secondary | ICD-10-CM | POA: Diagnosis not present

## 2017-06-25 DIAGNOSIS — Z79891 Long term (current) use of opiate analgesic: Secondary | ICD-10-CM | POA: Diagnosis not present

## 2017-06-25 DIAGNOSIS — L304 Erythema intertrigo: Secondary | ICD-10-CM | POA: Diagnosis not present

## 2017-06-25 DIAGNOSIS — Z4800 Encounter for change or removal of nonsurgical wound dressing: Secondary | ICD-10-CM | POA: Diagnosis not present

## 2017-06-25 DIAGNOSIS — J449 Chronic obstructive pulmonary disease, unspecified: Secondary | ICD-10-CM | POA: Diagnosis not present

## 2017-06-25 DIAGNOSIS — Z79810 Long term (current) use of selective estrogen receptor modulators (SERMs): Secondary | ICD-10-CM | POA: Diagnosis not present

## 2017-06-25 DIAGNOSIS — Z794 Long term (current) use of insulin: Secondary | ICD-10-CM | POA: Diagnosis not present

## 2017-06-25 DIAGNOSIS — I5032 Chronic diastolic (congestive) heart failure: Secondary | ICD-10-CM | POA: Diagnosis not present

## 2017-06-25 DIAGNOSIS — Z7982 Long term (current) use of aspirin: Secondary | ICD-10-CM | POA: Diagnosis not present

## 2017-06-25 DIAGNOSIS — L98492 Non-pressure chronic ulcer of skin of other sites with fat layer exposed: Secondary | ICD-10-CM | POA: Diagnosis not present

## 2017-06-25 DIAGNOSIS — E1165 Type 2 diabetes mellitus with hyperglycemia: Secondary | ICD-10-CM | POA: Diagnosis not present

## 2017-06-25 DIAGNOSIS — N183 Chronic kidney disease, stage 3 (moderate): Secondary | ICD-10-CM | POA: Diagnosis not present

## 2017-06-25 DIAGNOSIS — I130 Hypertensive heart and chronic kidney disease with heart failure and stage 1 through stage 4 chronic kidney disease, or unspecified chronic kidney disease: Secondary | ICD-10-CM | POA: Diagnosis not present

## 2017-06-25 DIAGNOSIS — D631 Anemia in chronic kidney disease: Secondary | ICD-10-CM | POA: Diagnosis not present

## 2017-06-25 DIAGNOSIS — E1122 Type 2 diabetes mellitus with diabetic chronic kidney disease: Secondary | ICD-10-CM | POA: Diagnosis not present

## 2017-06-25 DIAGNOSIS — Z9981 Dependence on supplemental oxygen: Secondary | ICD-10-CM | POA: Diagnosis not present

## 2017-06-26 DIAGNOSIS — Z9981 Dependence on supplemental oxygen: Secondary | ICD-10-CM | POA: Diagnosis not present

## 2017-06-26 DIAGNOSIS — N183 Chronic kidney disease, stage 3 (moderate): Secondary | ICD-10-CM | POA: Diagnosis not present

## 2017-06-26 DIAGNOSIS — L89152 Pressure ulcer of sacral region, stage 2: Secondary | ICD-10-CM | POA: Diagnosis not present

## 2017-06-26 DIAGNOSIS — I130 Hypertensive heart and chronic kidney disease with heart failure and stage 1 through stage 4 chronic kidney disease, or unspecified chronic kidney disease: Secondary | ICD-10-CM | POA: Diagnosis not present

## 2017-06-26 DIAGNOSIS — J449 Chronic obstructive pulmonary disease, unspecified: Secondary | ICD-10-CM | POA: Diagnosis not present

## 2017-06-26 DIAGNOSIS — Z79810 Long term (current) use of selective estrogen receptor modulators (SERMs): Secondary | ICD-10-CM | POA: Diagnosis not present

## 2017-06-26 DIAGNOSIS — I5032 Chronic diastolic (congestive) heart failure: Secondary | ICD-10-CM | POA: Diagnosis not present

## 2017-06-26 DIAGNOSIS — Z7982 Long term (current) use of aspirin: Secondary | ICD-10-CM | POA: Diagnosis not present

## 2017-06-26 DIAGNOSIS — Z4800 Encounter for change or removal of nonsurgical wound dressing: Secondary | ICD-10-CM | POA: Diagnosis not present

## 2017-06-26 DIAGNOSIS — L98492 Non-pressure chronic ulcer of skin of other sites with fat layer exposed: Secondary | ICD-10-CM | POA: Diagnosis not present

## 2017-06-26 DIAGNOSIS — E1122 Type 2 diabetes mellitus with diabetic chronic kidney disease: Secondary | ICD-10-CM | POA: Diagnosis not present

## 2017-06-26 DIAGNOSIS — Z794 Long term (current) use of insulin: Secondary | ICD-10-CM | POA: Diagnosis not present

## 2017-06-26 DIAGNOSIS — Z79891 Long term (current) use of opiate analgesic: Secondary | ICD-10-CM | POA: Diagnosis not present

## 2017-06-26 DIAGNOSIS — L304 Erythema intertrigo: Secondary | ICD-10-CM | POA: Diagnosis not present

## 2017-06-26 DIAGNOSIS — D631 Anemia in chronic kidney disease: Secondary | ICD-10-CM | POA: Diagnosis not present

## 2017-06-29 DIAGNOSIS — J449 Chronic obstructive pulmonary disease, unspecified: Secondary | ICD-10-CM | POA: Diagnosis not present

## 2017-06-29 DIAGNOSIS — N183 Chronic kidney disease, stage 3 (moderate): Secondary | ICD-10-CM | POA: Diagnosis not present

## 2017-06-29 DIAGNOSIS — Z7982 Long term (current) use of aspirin: Secondary | ICD-10-CM | POA: Diagnosis not present

## 2017-06-29 DIAGNOSIS — Z79810 Long term (current) use of selective estrogen receptor modulators (SERMs): Secondary | ICD-10-CM | POA: Diagnosis not present

## 2017-06-29 DIAGNOSIS — Z794 Long term (current) use of insulin: Secondary | ICD-10-CM | POA: Diagnosis not present

## 2017-06-29 DIAGNOSIS — Z79891 Long term (current) use of opiate analgesic: Secondary | ICD-10-CM | POA: Diagnosis not present

## 2017-06-29 DIAGNOSIS — I130 Hypertensive heart and chronic kidney disease with heart failure and stage 1 through stage 4 chronic kidney disease, or unspecified chronic kidney disease: Secondary | ICD-10-CM | POA: Diagnosis not present

## 2017-06-29 DIAGNOSIS — Z4800 Encounter for change or removal of nonsurgical wound dressing: Secondary | ICD-10-CM | POA: Diagnosis not present

## 2017-06-29 DIAGNOSIS — E1122 Type 2 diabetes mellitus with diabetic chronic kidney disease: Secondary | ICD-10-CM | POA: Diagnosis not present

## 2017-06-29 DIAGNOSIS — Z9981 Dependence on supplemental oxygen: Secondary | ICD-10-CM | POA: Diagnosis not present

## 2017-06-29 DIAGNOSIS — L98492 Non-pressure chronic ulcer of skin of other sites with fat layer exposed: Secondary | ICD-10-CM | POA: Diagnosis not present

## 2017-06-29 DIAGNOSIS — D631 Anemia in chronic kidney disease: Secondary | ICD-10-CM | POA: Diagnosis not present

## 2017-06-29 DIAGNOSIS — L304 Erythema intertrigo: Secondary | ICD-10-CM | POA: Diagnosis not present

## 2017-06-29 DIAGNOSIS — L89152 Pressure ulcer of sacral region, stage 2: Secondary | ICD-10-CM | POA: Diagnosis not present

## 2017-06-29 DIAGNOSIS — I5032 Chronic diastolic (congestive) heart failure: Secondary | ICD-10-CM | POA: Diagnosis not present

## 2017-06-30 DIAGNOSIS — Z79810 Long term (current) use of selective estrogen receptor modulators (SERMs): Secondary | ICD-10-CM | POA: Diagnosis not present

## 2017-06-30 DIAGNOSIS — I5032 Chronic diastolic (congestive) heart failure: Secondary | ICD-10-CM | POA: Diagnosis not present

## 2017-06-30 DIAGNOSIS — N183 Chronic kidney disease, stage 3 (moderate): Secondary | ICD-10-CM | POA: Diagnosis not present

## 2017-06-30 DIAGNOSIS — Z7982 Long term (current) use of aspirin: Secondary | ICD-10-CM | POA: Diagnosis not present

## 2017-06-30 DIAGNOSIS — L304 Erythema intertrigo: Secondary | ICD-10-CM | POA: Diagnosis not present

## 2017-06-30 DIAGNOSIS — Z4800 Encounter for change or removal of nonsurgical wound dressing: Secondary | ICD-10-CM | POA: Diagnosis not present

## 2017-06-30 DIAGNOSIS — L98492 Non-pressure chronic ulcer of skin of other sites with fat layer exposed: Secondary | ICD-10-CM | POA: Diagnosis not present

## 2017-06-30 DIAGNOSIS — Z79891 Long term (current) use of opiate analgesic: Secondary | ICD-10-CM | POA: Diagnosis not present

## 2017-06-30 DIAGNOSIS — D631 Anemia in chronic kidney disease: Secondary | ICD-10-CM | POA: Diagnosis not present

## 2017-06-30 DIAGNOSIS — Z794 Long term (current) use of insulin: Secondary | ICD-10-CM | POA: Diagnosis not present

## 2017-06-30 DIAGNOSIS — Z9981 Dependence on supplemental oxygen: Secondary | ICD-10-CM | POA: Diagnosis not present

## 2017-06-30 DIAGNOSIS — I130 Hypertensive heart and chronic kidney disease with heart failure and stage 1 through stage 4 chronic kidney disease, or unspecified chronic kidney disease: Secondary | ICD-10-CM | POA: Diagnosis not present

## 2017-06-30 DIAGNOSIS — J449 Chronic obstructive pulmonary disease, unspecified: Secondary | ICD-10-CM | POA: Diagnosis not present

## 2017-06-30 DIAGNOSIS — L89152 Pressure ulcer of sacral region, stage 2: Secondary | ICD-10-CM | POA: Diagnosis not present

## 2017-06-30 DIAGNOSIS — E1122 Type 2 diabetes mellitus with diabetic chronic kidney disease: Secondary | ICD-10-CM | POA: Diagnosis not present

## 2017-07-01 DIAGNOSIS — I130 Hypertensive heart and chronic kidney disease with heart failure and stage 1 through stage 4 chronic kidney disease, or unspecified chronic kidney disease: Secondary | ICD-10-CM | POA: Diagnosis not present

## 2017-07-01 DIAGNOSIS — L98492 Non-pressure chronic ulcer of skin of other sites with fat layer exposed: Secondary | ICD-10-CM | POA: Diagnosis not present

## 2017-07-01 DIAGNOSIS — Z79891 Long term (current) use of opiate analgesic: Secondary | ICD-10-CM | POA: Diagnosis not present

## 2017-07-01 DIAGNOSIS — Z9981 Dependence on supplemental oxygen: Secondary | ICD-10-CM | POA: Diagnosis not present

## 2017-07-01 DIAGNOSIS — L89152 Pressure ulcer of sacral region, stage 2: Secondary | ICD-10-CM | POA: Diagnosis not present

## 2017-07-01 DIAGNOSIS — L304 Erythema intertrigo: Secondary | ICD-10-CM | POA: Diagnosis not present

## 2017-07-01 DIAGNOSIS — Z794 Long term (current) use of insulin: Secondary | ICD-10-CM | POA: Diagnosis not present

## 2017-07-01 DIAGNOSIS — Z4800 Encounter for change or removal of nonsurgical wound dressing: Secondary | ICD-10-CM | POA: Diagnosis not present

## 2017-07-01 DIAGNOSIS — Z79810 Long term (current) use of selective estrogen receptor modulators (SERMs): Secondary | ICD-10-CM | POA: Diagnosis not present

## 2017-07-01 DIAGNOSIS — J449 Chronic obstructive pulmonary disease, unspecified: Secondary | ICD-10-CM | POA: Diagnosis not present

## 2017-07-01 DIAGNOSIS — N183 Chronic kidney disease, stage 3 (moderate): Secondary | ICD-10-CM | POA: Diagnosis not present

## 2017-07-01 DIAGNOSIS — Z7982 Long term (current) use of aspirin: Secondary | ICD-10-CM | POA: Diagnosis not present

## 2017-07-01 DIAGNOSIS — E1122 Type 2 diabetes mellitus with diabetic chronic kidney disease: Secondary | ICD-10-CM | POA: Diagnosis not present

## 2017-07-01 DIAGNOSIS — I5032 Chronic diastolic (congestive) heart failure: Secondary | ICD-10-CM | POA: Diagnosis not present

## 2017-07-01 DIAGNOSIS — D631 Anemia in chronic kidney disease: Secondary | ICD-10-CM | POA: Diagnosis not present

## 2017-07-03 DIAGNOSIS — L304 Erythema intertrigo: Secondary | ICD-10-CM | POA: Diagnosis not present

## 2017-07-03 DIAGNOSIS — E1122 Type 2 diabetes mellitus with diabetic chronic kidney disease: Secondary | ICD-10-CM | POA: Diagnosis not present

## 2017-07-03 DIAGNOSIS — Z79810 Long term (current) use of selective estrogen receptor modulators (SERMs): Secondary | ICD-10-CM | POA: Diagnosis not present

## 2017-07-03 DIAGNOSIS — L98492 Non-pressure chronic ulcer of skin of other sites with fat layer exposed: Secondary | ICD-10-CM | POA: Diagnosis not present

## 2017-07-03 DIAGNOSIS — Z79891 Long term (current) use of opiate analgesic: Secondary | ICD-10-CM | POA: Diagnosis not present

## 2017-07-03 DIAGNOSIS — N183 Chronic kidney disease, stage 3 (moderate): Secondary | ICD-10-CM | POA: Diagnosis not present

## 2017-07-03 DIAGNOSIS — Z4800 Encounter for change or removal of nonsurgical wound dressing: Secondary | ICD-10-CM | POA: Diagnosis not present

## 2017-07-03 DIAGNOSIS — J449 Chronic obstructive pulmonary disease, unspecified: Secondary | ICD-10-CM | POA: Diagnosis not present

## 2017-07-03 DIAGNOSIS — Z794 Long term (current) use of insulin: Secondary | ICD-10-CM | POA: Diagnosis not present

## 2017-07-03 DIAGNOSIS — I5032 Chronic diastolic (congestive) heart failure: Secondary | ICD-10-CM | POA: Diagnosis not present

## 2017-07-03 DIAGNOSIS — D631 Anemia in chronic kidney disease: Secondary | ICD-10-CM | POA: Diagnosis not present

## 2017-07-03 DIAGNOSIS — Z7982 Long term (current) use of aspirin: Secondary | ICD-10-CM | POA: Diagnosis not present

## 2017-07-03 DIAGNOSIS — I130 Hypertensive heart and chronic kidney disease with heart failure and stage 1 through stage 4 chronic kidney disease, or unspecified chronic kidney disease: Secondary | ICD-10-CM | POA: Diagnosis not present

## 2017-07-03 DIAGNOSIS — Z9981 Dependence on supplemental oxygen: Secondary | ICD-10-CM | POA: Diagnosis not present

## 2017-07-03 DIAGNOSIS — L89152 Pressure ulcer of sacral region, stage 2: Secondary | ICD-10-CM | POA: Diagnosis not present

## 2017-07-05 DIAGNOSIS — L89152 Pressure ulcer of sacral region, stage 2: Secondary | ICD-10-CM | POA: Diagnosis not present

## 2017-07-05 DIAGNOSIS — L98492 Non-pressure chronic ulcer of skin of other sites with fat layer exposed: Secondary | ICD-10-CM | POA: Diagnosis not present

## 2017-07-06 DIAGNOSIS — L89152 Pressure ulcer of sacral region, stage 2: Secondary | ICD-10-CM | POA: Diagnosis not present

## 2017-07-06 DIAGNOSIS — J449 Chronic obstructive pulmonary disease, unspecified: Secondary | ICD-10-CM | POA: Diagnosis not present

## 2017-07-06 DIAGNOSIS — I130 Hypertensive heart and chronic kidney disease with heart failure and stage 1 through stage 4 chronic kidney disease, or unspecified chronic kidney disease: Secondary | ICD-10-CM | POA: Diagnosis not present

## 2017-07-06 DIAGNOSIS — N183 Chronic kidney disease, stage 3 (moderate): Secondary | ICD-10-CM | POA: Diagnosis not present

## 2017-07-06 DIAGNOSIS — D631 Anemia in chronic kidney disease: Secondary | ICD-10-CM | POA: Diagnosis not present

## 2017-07-06 DIAGNOSIS — Z4800 Encounter for change or removal of nonsurgical wound dressing: Secondary | ICD-10-CM | POA: Diagnosis not present

## 2017-07-06 DIAGNOSIS — Z79810 Long term (current) use of selective estrogen receptor modulators (SERMs): Secondary | ICD-10-CM | POA: Diagnosis not present

## 2017-07-06 DIAGNOSIS — Z79891 Long term (current) use of opiate analgesic: Secondary | ICD-10-CM | POA: Diagnosis not present

## 2017-07-06 DIAGNOSIS — I5032 Chronic diastolic (congestive) heart failure: Secondary | ICD-10-CM | POA: Diagnosis not present

## 2017-07-06 DIAGNOSIS — Z794 Long term (current) use of insulin: Secondary | ICD-10-CM | POA: Diagnosis not present

## 2017-07-06 DIAGNOSIS — Z9981 Dependence on supplemental oxygen: Secondary | ICD-10-CM | POA: Diagnosis not present

## 2017-07-06 DIAGNOSIS — E1122 Type 2 diabetes mellitus with diabetic chronic kidney disease: Secondary | ICD-10-CM | POA: Diagnosis not present

## 2017-07-06 DIAGNOSIS — Z7982 Long term (current) use of aspirin: Secondary | ICD-10-CM | POA: Diagnosis not present

## 2017-07-06 DIAGNOSIS — L304 Erythema intertrigo: Secondary | ICD-10-CM | POA: Diagnosis not present

## 2017-07-06 DIAGNOSIS — L98492 Non-pressure chronic ulcer of skin of other sites with fat layer exposed: Secondary | ICD-10-CM | POA: Diagnosis not present

## 2017-07-07 DIAGNOSIS — Z7982 Long term (current) use of aspirin: Secondary | ICD-10-CM | POA: Diagnosis not present

## 2017-07-07 DIAGNOSIS — Z9981 Dependence on supplemental oxygen: Secondary | ICD-10-CM | POA: Diagnosis not present

## 2017-07-07 DIAGNOSIS — D631 Anemia in chronic kidney disease: Secondary | ICD-10-CM | POA: Diagnosis not present

## 2017-07-07 DIAGNOSIS — J449 Chronic obstructive pulmonary disease, unspecified: Secondary | ICD-10-CM | POA: Diagnosis not present

## 2017-07-07 DIAGNOSIS — L98492 Non-pressure chronic ulcer of skin of other sites with fat layer exposed: Secondary | ICD-10-CM | POA: Diagnosis not present

## 2017-07-07 DIAGNOSIS — Z4800 Encounter for change or removal of nonsurgical wound dressing: Secondary | ICD-10-CM | POA: Diagnosis not present

## 2017-07-07 DIAGNOSIS — Z79891 Long term (current) use of opiate analgesic: Secondary | ICD-10-CM | POA: Diagnosis not present

## 2017-07-07 DIAGNOSIS — I5032 Chronic diastolic (congestive) heart failure: Secondary | ICD-10-CM | POA: Diagnosis not present

## 2017-07-07 DIAGNOSIS — N183 Chronic kidney disease, stage 3 (moderate): Secondary | ICD-10-CM | POA: Diagnosis not present

## 2017-07-07 DIAGNOSIS — Z794 Long term (current) use of insulin: Secondary | ICD-10-CM | POA: Diagnosis not present

## 2017-07-07 DIAGNOSIS — I130 Hypertensive heart and chronic kidney disease with heart failure and stage 1 through stage 4 chronic kidney disease, or unspecified chronic kidney disease: Secondary | ICD-10-CM | POA: Diagnosis not present

## 2017-07-07 DIAGNOSIS — Z79810 Long term (current) use of selective estrogen receptor modulators (SERMs): Secondary | ICD-10-CM | POA: Diagnosis not present

## 2017-07-07 DIAGNOSIS — L89152 Pressure ulcer of sacral region, stage 2: Secondary | ICD-10-CM | POA: Diagnosis not present

## 2017-07-07 DIAGNOSIS — E1122 Type 2 diabetes mellitus with diabetic chronic kidney disease: Secondary | ICD-10-CM | POA: Diagnosis not present

## 2017-07-07 DIAGNOSIS — L304 Erythema intertrigo: Secondary | ICD-10-CM | POA: Diagnosis not present

## 2017-07-10 DIAGNOSIS — I5032 Chronic diastolic (congestive) heart failure: Secondary | ICD-10-CM | POA: Diagnosis not present

## 2017-07-10 DIAGNOSIS — Z9981 Dependence on supplemental oxygen: Secondary | ICD-10-CM | POA: Diagnosis not present

## 2017-07-10 DIAGNOSIS — S71112D Laceration without foreign body, left thigh, subsequent encounter: Secondary | ICD-10-CM | POA: Diagnosis not present

## 2017-07-10 DIAGNOSIS — Z4800 Encounter for change or removal of nonsurgical wound dressing: Secondary | ICD-10-CM | POA: Diagnosis not present

## 2017-07-10 DIAGNOSIS — Z79891 Long term (current) use of opiate analgesic: Secondary | ICD-10-CM | POA: Diagnosis not present

## 2017-07-10 DIAGNOSIS — L98492 Non-pressure chronic ulcer of skin of other sites with fat layer exposed: Secondary | ICD-10-CM | POA: Diagnosis not present

## 2017-07-10 DIAGNOSIS — L89152 Pressure ulcer of sacral region, stage 2: Secondary | ICD-10-CM | POA: Diagnosis not present

## 2017-07-10 DIAGNOSIS — N183 Chronic kidney disease, stage 3 (moderate): Secondary | ICD-10-CM | POA: Diagnosis not present

## 2017-07-10 DIAGNOSIS — I130 Hypertensive heart and chronic kidney disease with heart failure and stage 1 through stage 4 chronic kidney disease, or unspecified chronic kidney disease: Secondary | ICD-10-CM | POA: Diagnosis not present

## 2017-07-10 DIAGNOSIS — E1122 Type 2 diabetes mellitus with diabetic chronic kidney disease: Secondary | ICD-10-CM | POA: Diagnosis not present

## 2017-07-10 DIAGNOSIS — Z794 Long term (current) use of insulin: Secondary | ICD-10-CM | POA: Diagnosis not present

## 2017-07-10 DIAGNOSIS — Z7982 Long term (current) use of aspirin: Secondary | ICD-10-CM | POA: Diagnosis not present

## 2017-07-10 DIAGNOSIS — Z7902 Long term (current) use of antithrombotics/antiplatelets: Secondary | ICD-10-CM | POA: Diagnosis not present

## 2017-07-10 DIAGNOSIS — Z79810 Long term (current) use of selective estrogen receptor modulators (SERMs): Secondary | ICD-10-CM | POA: Diagnosis not present

## 2017-07-10 DIAGNOSIS — L304 Erythema intertrigo: Secondary | ICD-10-CM | POA: Diagnosis not present

## 2017-07-10 DIAGNOSIS — J449 Chronic obstructive pulmonary disease, unspecified: Secondary | ICD-10-CM | POA: Diagnosis not present

## 2017-07-10 DIAGNOSIS — D631 Anemia in chronic kidney disease: Secondary | ICD-10-CM | POA: Diagnosis not present

## 2017-07-13 DIAGNOSIS — I5032 Chronic diastolic (congestive) heart failure: Secondary | ICD-10-CM | POA: Diagnosis not present

## 2017-07-13 DIAGNOSIS — L98492 Non-pressure chronic ulcer of skin of other sites with fat layer exposed: Secondary | ICD-10-CM | POA: Diagnosis not present

## 2017-07-13 DIAGNOSIS — Z4800 Encounter for change or removal of nonsurgical wound dressing: Secondary | ICD-10-CM | POA: Diagnosis not present

## 2017-07-13 DIAGNOSIS — S71112D Laceration without foreign body, left thigh, subsequent encounter: Secondary | ICD-10-CM | POA: Diagnosis not present

## 2017-07-13 DIAGNOSIS — N183 Chronic kidney disease, stage 3 (moderate): Secondary | ICD-10-CM | POA: Diagnosis not present

## 2017-07-13 DIAGNOSIS — Z9981 Dependence on supplemental oxygen: Secondary | ICD-10-CM | POA: Diagnosis not present

## 2017-07-13 DIAGNOSIS — I130 Hypertensive heart and chronic kidney disease with heart failure and stage 1 through stage 4 chronic kidney disease, or unspecified chronic kidney disease: Secondary | ICD-10-CM | POA: Diagnosis not present

## 2017-07-13 DIAGNOSIS — Z7982 Long term (current) use of aspirin: Secondary | ICD-10-CM | POA: Diagnosis not present

## 2017-07-13 DIAGNOSIS — D631 Anemia in chronic kidney disease: Secondary | ICD-10-CM | POA: Diagnosis not present

## 2017-07-13 DIAGNOSIS — Z794 Long term (current) use of insulin: Secondary | ICD-10-CM | POA: Diagnosis not present

## 2017-07-13 DIAGNOSIS — L304 Erythema intertrigo: Secondary | ICD-10-CM | POA: Diagnosis not present

## 2017-07-13 DIAGNOSIS — E1122 Type 2 diabetes mellitus with diabetic chronic kidney disease: Secondary | ICD-10-CM | POA: Diagnosis not present

## 2017-07-13 DIAGNOSIS — J449 Chronic obstructive pulmonary disease, unspecified: Secondary | ICD-10-CM | POA: Diagnosis not present

## 2017-07-13 DIAGNOSIS — L89152 Pressure ulcer of sacral region, stage 2: Secondary | ICD-10-CM | POA: Diagnosis not present

## 2017-07-13 DIAGNOSIS — Z79810 Long term (current) use of selective estrogen receptor modulators (SERMs): Secondary | ICD-10-CM | POA: Diagnosis not present

## 2017-07-13 DIAGNOSIS — Z7902 Long term (current) use of antithrombotics/antiplatelets: Secondary | ICD-10-CM | POA: Diagnosis not present

## 2017-07-13 DIAGNOSIS — Z79891 Long term (current) use of opiate analgesic: Secondary | ICD-10-CM | POA: Diagnosis not present

## 2017-07-14 DIAGNOSIS — L89152 Pressure ulcer of sacral region, stage 2: Secondary | ICD-10-CM | POA: Diagnosis not present

## 2017-07-14 DIAGNOSIS — N183 Chronic kidney disease, stage 3 (moderate): Secondary | ICD-10-CM | POA: Diagnosis not present

## 2017-07-14 DIAGNOSIS — Z9981 Dependence on supplemental oxygen: Secondary | ICD-10-CM | POA: Diagnosis not present

## 2017-07-14 DIAGNOSIS — Z794 Long term (current) use of insulin: Secondary | ICD-10-CM | POA: Diagnosis not present

## 2017-07-14 DIAGNOSIS — Z7982 Long term (current) use of aspirin: Secondary | ICD-10-CM | POA: Diagnosis not present

## 2017-07-14 DIAGNOSIS — L98492 Non-pressure chronic ulcer of skin of other sites with fat layer exposed: Secondary | ICD-10-CM | POA: Diagnosis not present

## 2017-07-14 DIAGNOSIS — S71112D Laceration without foreign body, left thigh, subsequent encounter: Secondary | ICD-10-CM | POA: Diagnosis not present

## 2017-07-14 DIAGNOSIS — Z7902 Long term (current) use of antithrombotics/antiplatelets: Secondary | ICD-10-CM | POA: Diagnosis not present

## 2017-07-14 DIAGNOSIS — I130 Hypertensive heart and chronic kidney disease with heart failure and stage 1 through stage 4 chronic kidney disease, or unspecified chronic kidney disease: Secondary | ICD-10-CM | POA: Diagnosis not present

## 2017-07-14 DIAGNOSIS — D631 Anemia in chronic kidney disease: Secondary | ICD-10-CM | POA: Diagnosis not present

## 2017-07-14 DIAGNOSIS — L304 Erythema intertrigo: Secondary | ICD-10-CM | POA: Diagnosis not present

## 2017-07-14 DIAGNOSIS — Z79810 Long term (current) use of selective estrogen receptor modulators (SERMs): Secondary | ICD-10-CM | POA: Diagnosis not present

## 2017-07-14 DIAGNOSIS — E1122 Type 2 diabetes mellitus with diabetic chronic kidney disease: Secondary | ICD-10-CM | POA: Diagnosis not present

## 2017-07-14 DIAGNOSIS — J449 Chronic obstructive pulmonary disease, unspecified: Secondary | ICD-10-CM | POA: Diagnosis not present

## 2017-07-14 DIAGNOSIS — Z4800 Encounter for change or removal of nonsurgical wound dressing: Secondary | ICD-10-CM | POA: Diagnosis not present

## 2017-07-14 DIAGNOSIS — Z79891 Long term (current) use of opiate analgesic: Secondary | ICD-10-CM | POA: Diagnosis not present

## 2017-07-14 DIAGNOSIS — I5032 Chronic diastolic (congestive) heart failure: Secondary | ICD-10-CM | POA: Diagnosis not present

## 2017-07-17 DIAGNOSIS — S71112D Laceration without foreign body, left thigh, subsequent encounter: Secondary | ICD-10-CM | POA: Diagnosis not present

## 2017-07-17 DIAGNOSIS — Z9981 Dependence on supplemental oxygen: Secondary | ICD-10-CM | POA: Diagnosis not present

## 2017-07-17 DIAGNOSIS — E1122 Type 2 diabetes mellitus with diabetic chronic kidney disease: Secondary | ICD-10-CM | POA: Diagnosis not present

## 2017-07-17 DIAGNOSIS — Z4800 Encounter for change or removal of nonsurgical wound dressing: Secondary | ICD-10-CM | POA: Diagnosis not present

## 2017-07-17 DIAGNOSIS — N183 Chronic kidney disease, stage 3 (moderate): Secondary | ICD-10-CM | POA: Diagnosis not present

## 2017-07-17 DIAGNOSIS — Z7982 Long term (current) use of aspirin: Secondary | ICD-10-CM | POA: Diagnosis not present

## 2017-07-17 DIAGNOSIS — I5032 Chronic diastolic (congestive) heart failure: Secondary | ICD-10-CM | POA: Diagnosis not present

## 2017-07-17 DIAGNOSIS — Z79891 Long term (current) use of opiate analgesic: Secondary | ICD-10-CM | POA: Diagnosis not present

## 2017-07-17 DIAGNOSIS — L89152 Pressure ulcer of sacral region, stage 2: Secondary | ICD-10-CM | POA: Diagnosis not present

## 2017-07-17 DIAGNOSIS — J449 Chronic obstructive pulmonary disease, unspecified: Secondary | ICD-10-CM | POA: Diagnosis not present

## 2017-07-17 DIAGNOSIS — Z79810 Long term (current) use of selective estrogen receptor modulators (SERMs): Secondary | ICD-10-CM | POA: Diagnosis not present

## 2017-07-17 DIAGNOSIS — I130 Hypertensive heart and chronic kidney disease with heart failure and stage 1 through stage 4 chronic kidney disease, or unspecified chronic kidney disease: Secondary | ICD-10-CM | POA: Diagnosis not present

## 2017-07-17 DIAGNOSIS — Z794 Long term (current) use of insulin: Secondary | ICD-10-CM | POA: Diagnosis not present

## 2017-07-17 DIAGNOSIS — L98492 Non-pressure chronic ulcer of skin of other sites with fat layer exposed: Secondary | ICD-10-CM | POA: Diagnosis not present

## 2017-07-17 DIAGNOSIS — L304 Erythema intertrigo: Secondary | ICD-10-CM | POA: Diagnosis not present

## 2017-07-17 DIAGNOSIS — Z7902 Long term (current) use of antithrombotics/antiplatelets: Secondary | ICD-10-CM | POA: Diagnosis not present

## 2017-07-17 DIAGNOSIS — D631 Anemia in chronic kidney disease: Secondary | ICD-10-CM | POA: Diagnosis not present

## 2017-07-20 DIAGNOSIS — I5032 Chronic diastolic (congestive) heart failure: Secondary | ICD-10-CM | POA: Diagnosis not present

## 2017-07-20 DIAGNOSIS — Z79810 Long term (current) use of selective estrogen receptor modulators (SERMs): Secondary | ICD-10-CM | POA: Diagnosis not present

## 2017-07-20 DIAGNOSIS — S71112D Laceration without foreign body, left thigh, subsequent encounter: Secondary | ICD-10-CM | POA: Diagnosis not present

## 2017-07-20 DIAGNOSIS — L98492 Non-pressure chronic ulcer of skin of other sites with fat layer exposed: Secondary | ICD-10-CM | POA: Diagnosis not present

## 2017-07-20 DIAGNOSIS — Z9981 Dependence on supplemental oxygen: Secondary | ICD-10-CM | POA: Diagnosis not present

## 2017-07-20 DIAGNOSIS — Z79891 Long term (current) use of opiate analgesic: Secondary | ICD-10-CM | POA: Diagnosis not present

## 2017-07-20 DIAGNOSIS — Z7982 Long term (current) use of aspirin: Secondary | ICD-10-CM | POA: Diagnosis not present

## 2017-07-20 DIAGNOSIS — Z7902 Long term (current) use of antithrombotics/antiplatelets: Secondary | ICD-10-CM | POA: Diagnosis not present

## 2017-07-20 DIAGNOSIS — L89152 Pressure ulcer of sacral region, stage 2: Secondary | ICD-10-CM | POA: Diagnosis not present

## 2017-07-20 DIAGNOSIS — L304 Erythema intertrigo: Secondary | ICD-10-CM | POA: Diagnosis not present

## 2017-07-20 DIAGNOSIS — N183 Chronic kidney disease, stage 3 (moderate): Secondary | ICD-10-CM | POA: Diagnosis not present

## 2017-07-20 DIAGNOSIS — I130 Hypertensive heart and chronic kidney disease with heart failure and stage 1 through stage 4 chronic kidney disease, or unspecified chronic kidney disease: Secondary | ICD-10-CM | POA: Diagnosis not present

## 2017-07-20 DIAGNOSIS — Z794 Long term (current) use of insulin: Secondary | ICD-10-CM | POA: Diagnosis not present

## 2017-07-20 DIAGNOSIS — J449 Chronic obstructive pulmonary disease, unspecified: Secondary | ICD-10-CM | POA: Diagnosis not present

## 2017-07-20 DIAGNOSIS — Z4800 Encounter for change or removal of nonsurgical wound dressing: Secondary | ICD-10-CM | POA: Diagnosis not present

## 2017-07-20 DIAGNOSIS — E1122 Type 2 diabetes mellitus with diabetic chronic kidney disease: Secondary | ICD-10-CM | POA: Diagnosis not present

## 2017-07-20 DIAGNOSIS — D631 Anemia in chronic kidney disease: Secondary | ICD-10-CM | POA: Diagnosis not present

## 2017-07-21 ENCOUNTER — Other Ambulatory Visit: Payer: Self-pay

## 2017-07-21 ENCOUNTER — Encounter: Payer: Self-pay | Admitting: Cardiovascular Disease

## 2017-07-21 ENCOUNTER — Ambulatory Visit (INDEPENDENT_AMBULATORY_CARE_PROVIDER_SITE_OTHER): Payer: Medicare Other | Admitting: Cardiovascular Disease

## 2017-07-21 VITALS — BP 134/68 | HR 90 | Ht 66.0 in | Wt 269.0 lb

## 2017-07-21 DIAGNOSIS — I10 Essential (primary) hypertension: Secondary | ICD-10-CM

## 2017-07-21 DIAGNOSIS — Z7982 Long term (current) use of aspirin: Secondary | ICD-10-CM | POA: Diagnosis not present

## 2017-07-21 DIAGNOSIS — L98492 Non-pressure chronic ulcer of skin of other sites with fat layer exposed: Secondary | ICD-10-CM | POA: Diagnosis not present

## 2017-07-21 DIAGNOSIS — Z7902 Long term (current) use of antithrombotics/antiplatelets: Secondary | ICD-10-CM | POA: Diagnosis not present

## 2017-07-21 DIAGNOSIS — Z79810 Long term (current) use of selective estrogen receptor modulators (SERMs): Secondary | ICD-10-CM | POA: Diagnosis not present

## 2017-07-21 DIAGNOSIS — N183 Chronic kidney disease, stage 3 unspecified: Secondary | ICD-10-CM

## 2017-07-21 DIAGNOSIS — Z9289 Personal history of other medical treatment: Secondary | ICD-10-CM

## 2017-07-21 DIAGNOSIS — Z4800 Encounter for change or removal of nonsurgical wound dressing: Secondary | ICD-10-CM | POA: Diagnosis not present

## 2017-07-21 DIAGNOSIS — L304 Erythema intertrigo: Secondary | ICD-10-CM | POA: Diagnosis not present

## 2017-07-21 DIAGNOSIS — S71112D Laceration without foreign body, left thigh, subsequent encounter: Secondary | ICD-10-CM | POA: Diagnosis not present

## 2017-07-21 DIAGNOSIS — E1122 Type 2 diabetes mellitus with diabetic chronic kidney disease: Secondary | ICD-10-CM | POA: Diagnosis not present

## 2017-07-21 DIAGNOSIS — I5032 Chronic diastolic (congestive) heart failure: Secondary | ICD-10-CM | POA: Diagnosis not present

## 2017-07-21 DIAGNOSIS — R079 Chest pain, unspecified: Secondary | ICD-10-CM

## 2017-07-21 DIAGNOSIS — I130 Hypertensive heart and chronic kidney disease with heart failure and stage 1 through stage 4 chronic kidney disease, or unspecified chronic kidney disease: Secondary | ICD-10-CM | POA: Diagnosis not present

## 2017-07-21 DIAGNOSIS — Z9981 Dependence on supplemental oxygen: Secondary | ICD-10-CM | POA: Diagnosis not present

## 2017-07-21 DIAGNOSIS — Z79891 Long term (current) use of opiate analgesic: Secondary | ICD-10-CM | POA: Diagnosis not present

## 2017-07-21 DIAGNOSIS — J449 Chronic obstructive pulmonary disease, unspecified: Secondary | ICD-10-CM | POA: Diagnosis not present

## 2017-07-21 DIAGNOSIS — E785 Hyperlipidemia, unspecified: Secondary | ICD-10-CM | POA: Diagnosis not present

## 2017-07-21 DIAGNOSIS — I25118 Atherosclerotic heart disease of native coronary artery with other forms of angina pectoris: Secondary | ICD-10-CM | POA: Diagnosis not present

## 2017-07-21 DIAGNOSIS — L89152 Pressure ulcer of sacral region, stage 2: Secondary | ICD-10-CM | POA: Diagnosis not present

## 2017-07-21 DIAGNOSIS — Z955 Presence of coronary angioplasty implant and graft: Secondary | ICD-10-CM

## 2017-07-21 DIAGNOSIS — D631 Anemia in chronic kidney disease: Secondary | ICD-10-CM | POA: Diagnosis not present

## 2017-07-21 DIAGNOSIS — Z794 Long term (current) use of insulin: Secondary | ICD-10-CM | POA: Diagnosis not present

## 2017-07-21 MED ORDER — METOPROLOL TARTRATE 50 MG PO TABS: 50.0000 mg | ORAL_TABLET | Freq: Two times a day (BID) | ORAL | 1 refills | Status: DC

## 2017-07-21 MED ORDER — ISOSORBIDE MONONITRATE ER 60 MG PO TB24: 60.0000 mg | ORAL_TABLET | Freq: Every day | ORAL | 1 refills | Status: DC

## 2017-07-21 NOTE — Progress Notes (Signed)
SUBJECTIVE: The patient presents for posthospitalization follow-up.  She underwent drug-eluting stent placement to the proximal LAD which had a 95% stenosis with an 80% stenosis in a side branch in the ostium of the first diagonal.  The side branch was reduced to 20% residual stenosis after intervention via balloon angioplasty.  Echocardiogram 05/16/17 showed normal left ventricular systolic function and regional wall motion, LVEF 60-65%, moderate LVH, grade 1 diastolic dysfunction, moderate left atrial and moderate right atrial dilatation with mildly reduced right ventricular systolic function and mild right ventricular dilatation.  Pulmonary pressures were mild to moderately increased.  Past medical history also includes chronic diastolic heart failure, hypertension, hyperlipidemia, type 2 diabetes mellitus, and COPD.  I personally reviewed the ECG performed today which demonstrated sinus rhythm with frequent PACs.  She said she has left elbow pain which radiates into her jaw and then into her chest.  She has a history of chronic chest pain dating back several years.    Review of Systems: As per "subjective", otherwise negative.  Allergies  Allergen Reactions  . Dilaudid [Hydromorphone Hcl] Itching  . Actifed Cold-Allergy [Chlorpheniramine-Phenylephrine]     "I was sick and red and it didn't agree with me at all"  . Doxycycline Nausea And Vomiting  . Other Cough    Pt states she is allergic to ragweed and that she starts coughing and sneezing like crazy  . Penicillins Hives and Itching    Tolerates Rocephin Has patient had a PCN reaction causing immediate rash, facial/tongue/throat swelling, SOB or lightheadedness with hypotension: Yes Has patient had a PCN reaction causing severe rash involving mucus membranes or skin necrosis: No Has patient had a PCN reaction that required hospitalization Yes Has patient had a PCN reaction occurring within the last 10 years: No If all of the  above answers are "NO", then may proceed with Cephalosporin use.   . Reglan [Metoclopramide] Itching  . Valium [Diazepam] Itching  . Vistaril [Hydroxyzine Hcl] Itching    Current Outpatient Medications  Medication Sig Dispense Refill  . albuterol (PROAIR HFA) 108 (90 BASE) MCG/ACT inhaler Inhale 1 puff into the lungs every 4 (four) hours as needed. For shortness of breath (Patient taking differently: Inhale 2 puffs into the lungs every 4 (four) hours as needed. For shortness of breath) 1 Inhaler 1  . ALPRAZolam (XANAX) 1 MG tablet Take 1 mg by mouth 3 (three) times daily as needed for anxiety or sleep.     . Amino Acids-Protein Hydrolys (FEEDING SUPPLEMENT, PRO-STAT SUGAR FREE 64,) LIQD Take 30 mLs by mouth 2 (two) times daily. 900 mL 0  . aspirin EC 81 MG tablet Take 81 mg by mouth daily.    Marland Kitchen atorvastatin (LIPITOR) 80 MG tablet Take 1 tablet (80 mg total) by mouth daily at 6 PM. 30 tablet 0  . Cholecalciferol (VITAMIN D3) 5000 units CAPS Take 1 capsule (5,000 Units total) by mouth daily. 90 capsule 0  . clopidogrel (PLAVIX) 75 MG tablet Take 1 tablet (75 mg total) by mouth daily. 30 tablet 0  . ferrous sulfate 325 (65 FE) MG tablet Take 1 tablet (325 mg total) by mouth daily with breakfast. 30 tablet 3  . furosemide (LASIX) 40 MG tablet Take 1 tablet (40 mg total) by mouth daily. 30 tablet 0  . gabapentin (NEURONTIN) 300 MG capsule Take 1 capsule (300 mg total) by mouth 3 (three) times daily. 90 capsule 0  . Hydrocortisone (GERHARDT'S BUTT CREAM) CREA Apply 1 application topically  3 (three) times daily. 90 each 0  . insulin aspart (NOVOLOG) 100 UNIT/ML injection Inject 0-5 Units into the skin at bedtime. 10 mL 11  . insulin aspart (NOVOLOG) 100 UNIT/ML injection Inject 0-9 Units into the skin 3 (three) times daily with meals. 10 mL 11  . insulin detemir (LEVEMIR) 100 UNIT/ML injection Inject 0.52 mLs (52 Units total) into the skin at bedtime. 10 mL 11  . insulin lispro (HUMALOG) 100 UNIT/ML  injection INJECT 10-16 UNITS 3 TIMES DAILY BEFORE MEALS 10 mL 0  . isosorbide mononitrate (IMDUR) 30 MG 24 hr tablet Take 1 tablet (30 mg total) by mouth daily. 30 tablet 0  . levothyroxine (SYNTHROID, LEVOTHROID) 112 MCG tablet Take 224 mcg by mouth daily before breakfast.     . lidocaine-prilocaine (EMLA) cream Apply 1 application topically as needed. (Patient taking differently: Apply 1 application topically daily as needed. ) 30 g 0  . metoprolol tartrate (LOPRESSOR) 25 MG tablet Take 1 tablet (25 mg total) by mouth 2 (two) times daily. 60 tablet 0  . nystatin (MYCOSTATIN/NYSTOP) powder Apply topically 2 (two) times daily. 15 g 0  . Omega-3 Fatty Acids (FISH OIL PO) Take 1 capsule by mouth daily.     . ondansetron (ZOFRAN) 4 MG tablet Take 4 mg by mouth every 8 (eight) hours as needed for vomiting.     Marland Kitchen oxyCODONE-acetaminophen (PERCOCET) 10-325 MG tablet Take 1 tablet by mouth every 6 (six) hours as needed for pain.    . pantoprazole (PROTONIX) 40 MG tablet Take 1 tablet (40 mg total) by mouth daily. 30 tablet 0  . polyethylene glycol (MIRALAX / GLYCOLAX) packet Take 17 g by mouth daily as needed for mild constipation or moderate constipation. 14 each 0  . silver sulfADIAZINE (SILVADENE) 1 % cream Apply 1 application topically daily.    . SURE COMFORT INS SYR 1CC/28G 28G X 1/2" 1 ML MISC USE TO INJECT INSULIN UP TO 4 TIMES DAILY. 150 each 5  . tiZANidine (ZANAFLEX) 4 MG tablet Take 4 mg by mouth every 8 (eight) hours as needed for muscle spasms.     Marland Kitchen topiramate (TOPAMAX) 25 MG tablet Take 75 mg by mouth daily.     Marland Kitchen triamcinolone cream (KENALOG) 0.1 % Apply 1 application topically 4 (four) times daily.     Marland Kitchen umeclidinium-vilanterol (ANORO ELLIPTA) 62.5-25 MCG/INH AEPB Inhale 1 puff into the lungs daily. 30 each 0  . VICTOZA 18 MG/3ML SOPN Inject 1.2 mg into the skin daily.      No current facility-administered medications for this visit.     Past Medical History:  Diagnosis Date  .  Anxiety   . CAD (coronary artery disease)   . CHF (congestive heart failure) ([facility])    Diastolic  . Chronic back pain   . COPD (chronic obstructive pulmonary disease) ([place])   . Degenerative disk disease   . Diabetes mellitus without complication ([place])   . Hypertension   . Hypothyroidism   . On home O2    2.5 L N/C prn  . Pedal edema     Past Surgical History:  Procedure Laterality Date  . CORONARY STENT INTERVENTION N/A 05/22/2017   Procedure: CORONARY STENT INTERVENTION;  Surgeon: Martinique, Peter M, MD;  Location: [facility];  Service: Cardiovascular;  Laterality: N/A;  . HERNIA REPAIR    . RIGHT/LEFT HEART CATH AND CORONARY ANGIOGRAPHY N/A 05/19/2017   Procedure: RIGHT/LEFT HEART CATH AND CORONARY ANGIOGRAPHY;  Surgeon: Leonie Man, MD;  Location: [facility];  Service: Cardiovascular;  Laterality: N/A;  . TUBAL LIGATION      Social History   Socioeconomic History  . Marital status: Widowed    Spouse name: Not on file  . Number of children: Not on file  . Years of education: Not on file  . Highest education level: Not on file  Social Needs  . Financial resource strain: Not on file  . Food insecurity - worry: Not on file  . Food insecurity - inability: Not on file  . Transportation needs - medical: Not on file  . Transportation needs - non-medical: Not on file  Occupational History  . Not on file  Tobacco Use  . Smoking status: Former Smoker    Packs/day: 0.25    Types: Cigarettes    Last attempt to quit: 06/27/2015    Years since quitting: 2.0  . Smokeless tobacco: Never Used  Substance and Sexual Activity  . Alcohol use: No    Alcohol/week: 0.0 oz  . Drug use: No  . Sexual activity: Not Currently    Birth control/protection: Surgical  Other Topics Concern  . Not on file  Social History Narrative  . Not on file     Vitals:   07/21/17 1613  BP: 134/68  Pulse: 90  SpO2: 97%  Weight: 269 lb (122 kg)  Height: 5\' 6"  (1.676 m)    Wt  Readings from Last 3 Encounters:  07/21/17 269 lb (122 kg)  05/26/17 274 lb 9.6 oz (124.6 kg)  02/02/17 294 lb 1.5 oz (133.4 kg)     PHYSICAL EXAM General: NAD HEENT: Normal. Neck: No JVD, no thyromegaly. Lungs: Clear to auscultation bilaterally with normal respiratory effort. CV: Regular rate and rhythm, normal S1/S2, no S3/S4, no murmur.  Legs are wrapped. Abdomen: Morbidly obese.  Neurologic: Alert and oriented.  Psych: Normal affect. Skin: Normal. Musculoskeletal: No gross deformities.    ECG: Most recent ECG reviewed.   Labs: Lab Results  Component Value Date/Time   K 4.4 05/26/2017 05:56 AM   BUN 32 (H) 05/26/2017 05:56 AM   BUN 28 (A) 11/11/2016   CREATININE 1.47 (H) 05/26/2017 05:56 AM   ALT 13 (L) 05/25/2017 06:26 AM   TSH 0.320 (L) 05/15/2017 06:37 PM   TSH <0.008 (L) 04/14/2013 01:15 PM   HGB 10.2 (L) 05/25/2017 06:26 AM     Lipids: Lab Results  Component Value Date/Time   LDLCALC 107 (H) 05/19/2017 06:22 AM   CHOL 164 05/19/2017 06:22 AM   TRIG 104 05/19/2017 06:22 AM   HDL 36 (L) 05/19/2017 06:22 AM       ASSESSMENT AND PLAN: 1.  Coronary disease status post drug-eluting stent placement to the LAD and angioplasty of the diagonal branch with chest pain: Continue dual antiplatelet therapy for a minimum of 1 year with aspirin and Plavix.  Continue Imdur (increased to 60 mg), metoprolol (increase to 50 mg twice daily), and Lipitor.  2.  Chronic diastolic heart failure: Euvolemic on Lasix 40 mg daily.  No change to therapy.  3.  Hypertension: Blood pressure is normal.  No change to therapy.  4.  Hyperlipidemia: Continue Lipitor 80 mg.  HDL 36 and LDL 107 on 05/19/17.  Lipids will need to be repeated within the next few months.  5.  Oxygen dependent COPD: Stable.  6.  Chronic kidney disease stage III: Stable.  BUN 32, creatinine 1.47 on 05/26/17.    Disposition: Follow up 6 months  Time spent:  40 minutes, of which greater than 50% was spent  reviewing symptoms, relevant blood tests and studies, and discussing management plan with the patient.    Kate Sable, M.D., F.A.C.C.

## 2017-07-21 NOTE — Patient Instructions (Signed)
Medication Instructions:  Your physician has recommended you make the following change in your medication:    INCREASE Lopressor to 50 mg twice daily    INCREASE Imdur to 60 mg daily   Please continue all other medications as prescribed  Labwork: NONE  Testing/Procedures: NONE  Follow-Up: Your physician wants you to follow-up in: [place] will receive a reminder letter in the mail two months in advance. If you don't receive a letter, please call our office to schedule the follow-up appointment.  Any Other Special Instructions Will Be Listed Below (If Applicable).  If you need a refill on your cardiac medications before your next appointment, please call your pharmacy.

## 2017-07-23 ENCOUNTER — Ambulatory Visit: Payer: Medicare Other | Admitting: Cardiovascular Disease

## 2017-07-24 ENCOUNTER — Telehealth: Payer: Self-pay

## 2017-07-24 DIAGNOSIS — Z79810 Long term (current) use of selective estrogen receptor modulators (SERMs): Secondary | ICD-10-CM | POA: Diagnosis not present

## 2017-07-24 DIAGNOSIS — Z794 Long term (current) use of insulin: Secondary | ICD-10-CM | POA: Diagnosis not present

## 2017-07-24 DIAGNOSIS — D631 Anemia in chronic kidney disease: Secondary | ICD-10-CM | POA: Diagnosis not present

## 2017-07-24 DIAGNOSIS — S71112D Laceration without foreign body, left thigh, subsequent encounter: Secondary | ICD-10-CM | POA: Diagnosis not present

## 2017-07-24 DIAGNOSIS — L89152 Pressure ulcer of sacral region, stage 2: Secondary | ICD-10-CM | POA: Diagnosis not present

## 2017-07-24 DIAGNOSIS — Z9981 Dependence on supplemental oxygen: Secondary | ICD-10-CM | POA: Diagnosis not present

## 2017-07-24 DIAGNOSIS — J449 Chronic obstructive pulmonary disease, unspecified: Secondary | ICD-10-CM | POA: Diagnosis not present

## 2017-07-24 DIAGNOSIS — L304 Erythema intertrigo: Secondary | ICD-10-CM | POA: Diagnosis not present

## 2017-07-24 DIAGNOSIS — N183 Chronic kidney disease, stage 3 (moderate): Secondary | ICD-10-CM | POA: Diagnosis not present

## 2017-07-24 DIAGNOSIS — Z7982 Long term (current) use of aspirin: Secondary | ICD-10-CM | POA: Diagnosis not present

## 2017-07-24 DIAGNOSIS — Z79891 Long term (current) use of opiate analgesic: Secondary | ICD-10-CM | POA: Diagnosis not present

## 2017-07-24 DIAGNOSIS — I130 Hypertensive heart and chronic kidney disease with heart failure and stage 1 through stage 4 chronic kidney disease, or unspecified chronic kidney disease: Secondary | ICD-10-CM | POA: Diagnosis not present

## 2017-07-24 DIAGNOSIS — I5032 Chronic diastolic (congestive) heart failure: Secondary | ICD-10-CM | POA: Diagnosis not present

## 2017-07-24 DIAGNOSIS — Z7902 Long term (current) use of antithrombotics/antiplatelets: Secondary | ICD-10-CM | POA: Diagnosis not present

## 2017-07-24 DIAGNOSIS — L98492 Non-pressure chronic ulcer of skin of other sites with fat layer exposed: Secondary | ICD-10-CM | POA: Diagnosis not present

## 2017-07-24 DIAGNOSIS — E1122 Type 2 diabetes mellitus with diabetic chronic kidney disease: Secondary | ICD-10-CM | POA: Diagnosis not present

## 2017-07-24 DIAGNOSIS — Z4800 Encounter for change or removal of nonsurgical wound dressing: Secondary | ICD-10-CM | POA: Diagnosis not present

## 2017-07-24 MED ORDER — ISOSORBIDE MONONITRATE ER 30 MG PO TB24: 30.0000 mg | ORAL_TABLET | Freq: Every day | ORAL | 0 refills | Status: DC

## 2017-07-24 NOTE — Telephone Encounter (Signed)
Can decrease Imdur to 30 mg.

## 2017-07-24 NOTE — Telephone Encounter (Signed)
Home health nurse notified.

## 2017-07-24 NOTE — Telephone Encounter (Signed)
Home health nurse contacted office stating that today at her visit with patient her BP after checking it multiple times was 90/64. Nurse wanted to know if the increase in lopressor might be too much for patient. Nurse also stated that patient reported feeling light headed and dizzy but unsure if these were true statements. Will forward to provider to advise.

## 2017-07-28 DIAGNOSIS — Z7982 Long term (current) use of aspirin: Secondary | ICD-10-CM | POA: Diagnosis not present

## 2017-07-28 DIAGNOSIS — I130 Hypertensive heart and chronic kidney disease with heart failure and stage 1 through stage 4 chronic kidney disease, or unspecified chronic kidney disease: Secondary | ICD-10-CM | POA: Diagnosis not present

## 2017-07-28 DIAGNOSIS — J449 Chronic obstructive pulmonary disease, unspecified: Secondary | ICD-10-CM | POA: Diagnosis not present

## 2017-07-28 DIAGNOSIS — Z4800 Encounter for change or removal of nonsurgical wound dressing: Secondary | ICD-10-CM | POA: Diagnosis not present

## 2017-07-28 DIAGNOSIS — I5032 Chronic diastolic (congestive) heart failure: Secondary | ICD-10-CM | POA: Diagnosis not present

## 2017-07-28 DIAGNOSIS — Z9981 Dependence on supplemental oxygen: Secondary | ICD-10-CM | POA: Diagnosis not present

## 2017-07-28 DIAGNOSIS — Z79891 Long term (current) use of opiate analgesic: Secondary | ICD-10-CM | POA: Diagnosis not present

## 2017-07-28 DIAGNOSIS — L89152 Pressure ulcer of sacral region, stage 2: Secondary | ICD-10-CM | POA: Diagnosis not present

## 2017-07-28 DIAGNOSIS — E1122 Type 2 diabetes mellitus with diabetic chronic kidney disease: Secondary | ICD-10-CM | POA: Diagnosis not present

## 2017-07-28 DIAGNOSIS — N183 Chronic kidney disease, stage 3 (moderate): Secondary | ICD-10-CM | POA: Diagnosis not present

## 2017-07-28 DIAGNOSIS — Z79810 Long term (current) use of selective estrogen receptor modulators (SERMs): Secondary | ICD-10-CM | POA: Diagnosis not present

## 2017-07-28 DIAGNOSIS — Z794 Long term (current) use of insulin: Secondary | ICD-10-CM | POA: Diagnosis not present

## 2017-07-28 DIAGNOSIS — S71112D Laceration without foreign body, left thigh, subsequent encounter: Secondary | ICD-10-CM | POA: Diagnosis not present

## 2017-07-28 DIAGNOSIS — D631 Anemia in chronic kidney disease: Secondary | ICD-10-CM | POA: Diagnosis not present

## 2017-07-28 DIAGNOSIS — Z7902 Long term (current) use of antithrombotics/antiplatelets: Secondary | ICD-10-CM | POA: Diagnosis not present

## 2017-07-28 DIAGNOSIS — L304 Erythema intertrigo: Secondary | ICD-10-CM | POA: Diagnosis not present

## 2017-07-28 DIAGNOSIS — L98492 Non-pressure chronic ulcer of skin of other sites with fat layer exposed: Secondary | ICD-10-CM | POA: Diagnosis not present

## 2017-07-29 DIAGNOSIS — L98492 Non-pressure chronic ulcer of skin of other sites with fat layer exposed: Secondary | ICD-10-CM | POA: Diagnosis not present

## 2017-07-31 DIAGNOSIS — J449 Chronic obstructive pulmonary disease, unspecified: Secondary | ICD-10-CM | POA: Diagnosis not present

## 2017-07-31 DIAGNOSIS — Z794 Long term (current) use of insulin: Secondary | ICD-10-CM | POA: Diagnosis not present

## 2017-07-31 DIAGNOSIS — Z9981 Dependence on supplemental oxygen: Secondary | ICD-10-CM | POA: Diagnosis not present

## 2017-07-31 DIAGNOSIS — Z7982 Long term (current) use of aspirin: Secondary | ICD-10-CM | POA: Diagnosis not present

## 2017-07-31 DIAGNOSIS — Z7902 Long term (current) use of antithrombotics/antiplatelets: Secondary | ICD-10-CM | POA: Diagnosis not present

## 2017-07-31 DIAGNOSIS — Z4800 Encounter for change or removal of nonsurgical wound dressing: Secondary | ICD-10-CM | POA: Diagnosis not present

## 2017-07-31 DIAGNOSIS — S71112D Laceration without foreign body, left thigh, subsequent encounter: Secondary | ICD-10-CM | POA: Diagnosis not present

## 2017-07-31 DIAGNOSIS — L304 Erythema intertrigo: Secondary | ICD-10-CM | POA: Diagnosis not present

## 2017-07-31 DIAGNOSIS — E1122 Type 2 diabetes mellitus with diabetic chronic kidney disease: Secondary | ICD-10-CM | POA: Diagnosis not present

## 2017-07-31 DIAGNOSIS — I5032 Chronic diastolic (congestive) heart failure: Secondary | ICD-10-CM | POA: Diagnosis not present

## 2017-07-31 DIAGNOSIS — D631 Anemia in chronic kidney disease: Secondary | ICD-10-CM | POA: Diagnosis not present

## 2017-07-31 DIAGNOSIS — Z79891 Long term (current) use of opiate analgesic: Secondary | ICD-10-CM | POA: Diagnosis not present

## 2017-07-31 DIAGNOSIS — Z79810 Long term (current) use of selective estrogen receptor modulators (SERMs): Secondary | ICD-10-CM | POA: Diagnosis not present

## 2017-07-31 DIAGNOSIS — L98492 Non-pressure chronic ulcer of skin of other sites with fat layer exposed: Secondary | ICD-10-CM | POA: Diagnosis not present

## 2017-07-31 DIAGNOSIS — N183 Chronic kidney disease, stage 3 (moderate): Secondary | ICD-10-CM | POA: Diagnosis not present

## 2017-07-31 DIAGNOSIS — I130 Hypertensive heart and chronic kidney disease with heart failure and stage 1 through stage 4 chronic kidney disease, or unspecified chronic kidney disease: Secondary | ICD-10-CM | POA: Diagnosis not present

## 2017-07-31 DIAGNOSIS — L89152 Pressure ulcer of sacral region, stage 2: Secondary | ICD-10-CM | POA: Diagnosis not present

## 2017-08-03 DIAGNOSIS — I130 Hypertensive heart and chronic kidney disease with heart failure and stage 1 through stage 4 chronic kidney disease, or unspecified chronic kidney disease: Secondary | ICD-10-CM | POA: Diagnosis not present

## 2017-08-03 DIAGNOSIS — N183 Chronic kidney disease, stage 3 (moderate): Secondary | ICD-10-CM | POA: Diagnosis not present

## 2017-08-03 DIAGNOSIS — J449 Chronic obstructive pulmonary disease, unspecified: Secondary | ICD-10-CM | POA: Diagnosis not present

## 2017-08-03 DIAGNOSIS — D631 Anemia in chronic kidney disease: Secondary | ICD-10-CM | POA: Diagnosis not present

## 2017-08-03 DIAGNOSIS — L89152 Pressure ulcer of sacral region, stage 2: Secondary | ICD-10-CM | POA: Diagnosis not present

## 2017-08-03 DIAGNOSIS — Z794 Long term (current) use of insulin: Secondary | ICD-10-CM | POA: Diagnosis not present

## 2017-08-03 DIAGNOSIS — Z9981 Dependence on supplemental oxygen: Secondary | ICD-10-CM | POA: Diagnosis not present

## 2017-08-03 DIAGNOSIS — L98492 Non-pressure chronic ulcer of skin of other sites with fat layer exposed: Secondary | ICD-10-CM | POA: Diagnosis not present

## 2017-08-03 DIAGNOSIS — L304 Erythema intertrigo: Secondary | ICD-10-CM | POA: Diagnosis not present

## 2017-08-03 DIAGNOSIS — S71112D Laceration without foreign body, left thigh, subsequent encounter: Secondary | ICD-10-CM | POA: Diagnosis not present

## 2017-08-03 DIAGNOSIS — Z7902 Long term (current) use of antithrombotics/antiplatelets: Secondary | ICD-10-CM | POA: Diagnosis not present

## 2017-08-03 DIAGNOSIS — E1122 Type 2 diabetes mellitus with diabetic chronic kidney disease: Secondary | ICD-10-CM | POA: Diagnosis not present

## 2017-08-03 DIAGNOSIS — Z79891 Long term (current) use of opiate analgesic: Secondary | ICD-10-CM | POA: Diagnosis not present

## 2017-08-03 DIAGNOSIS — Z7982 Long term (current) use of aspirin: Secondary | ICD-10-CM | POA: Diagnosis not present

## 2017-08-03 DIAGNOSIS — I5032 Chronic diastolic (congestive) heart failure: Secondary | ICD-10-CM | POA: Diagnosis not present

## 2017-08-03 DIAGNOSIS — Z79810 Long term (current) use of selective estrogen receptor modulators (SERMs): Secondary | ICD-10-CM | POA: Diagnosis not present

## 2017-08-03 DIAGNOSIS — Z4800 Encounter for change or removal of nonsurgical wound dressing: Secondary | ICD-10-CM | POA: Diagnosis not present

## 2017-08-04 DIAGNOSIS — E1165 Type 2 diabetes mellitus with hyperglycemia: Secondary | ICD-10-CM | POA: Diagnosis not present

## 2017-08-05 DIAGNOSIS — L98492 Non-pressure chronic ulcer of skin of other sites with fat layer exposed: Secondary | ICD-10-CM | POA: Diagnosis not present

## 2017-08-05 DIAGNOSIS — N183 Chronic kidney disease, stage 3 (moderate): Secondary | ICD-10-CM | POA: Diagnosis not present

## 2017-08-05 DIAGNOSIS — E1122 Type 2 diabetes mellitus with diabetic chronic kidney disease: Secondary | ICD-10-CM | POA: Diagnosis not present

## 2017-08-05 DIAGNOSIS — Z9981 Dependence on supplemental oxygen: Secondary | ICD-10-CM | POA: Diagnosis not present

## 2017-08-05 DIAGNOSIS — Z794 Long term (current) use of insulin: Secondary | ICD-10-CM | POA: Diagnosis not present

## 2017-08-05 DIAGNOSIS — L89152 Pressure ulcer of sacral region, stage 2: Secondary | ICD-10-CM | POA: Diagnosis not present

## 2017-08-05 DIAGNOSIS — I130 Hypertensive heart and chronic kidney disease with heart failure and stage 1 through stage 4 chronic kidney disease, or unspecified chronic kidney disease: Secondary | ICD-10-CM | POA: Diagnosis not present

## 2017-08-05 DIAGNOSIS — D631 Anemia in chronic kidney disease: Secondary | ICD-10-CM | POA: Diagnosis not present

## 2017-08-05 DIAGNOSIS — Z79891 Long term (current) use of opiate analgesic: Secondary | ICD-10-CM | POA: Diagnosis not present

## 2017-08-05 DIAGNOSIS — S71112D Laceration without foreign body, left thigh, subsequent encounter: Secondary | ICD-10-CM | POA: Diagnosis not present

## 2017-08-05 DIAGNOSIS — L304 Erythema intertrigo: Secondary | ICD-10-CM | POA: Diagnosis not present

## 2017-08-05 DIAGNOSIS — I5032 Chronic diastolic (congestive) heart failure: Secondary | ICD-10-CM | POA: Diagnosis not present

## 2017-08-05 DIAGNOSIS — Z7902 Long term (current) use of antithrombotics/antiplatelets: Secondary | ICD-10-CM | POA: Diagnosis not present

## 2017-08-05 DIAGNOSIS — Z7982 Long term (current) use of aspirin: Secondary | ICD-10-CM | POA: Diagnosis not present

## 2017-08-05 DIAGNOSIS — J449 Chronic obstructive pulmonary disease, unspecified: Secondary | ICD-10-CM | POA: Diagnosis not present

## 2017-08-05 DIAGNOSIS — Z79810 Long term (current) use of selective estrogen receptor modulators (SERMs): Secondary | ICD-10-CM | POA: Diagnosis not present

## 2017-08-05 DIAGNOSIS — Z4800 Encounter for change or removal of nonsurgical wound dressing: Secondary | ICD-10-CM | POA: Diagnosis not present

## 2017-08-06 DIAGNOSIS — Z79891 Long term (current) use of opiate analgesic: Secondary | ICD-10-CM | POA: Diagnosis not present

## 2017-08-06 DIAGNOSIS — J449 Chronic obstructive pulmonary disease, unspecified: Secondary | ICD-10-CM | POA: Diagnosis not present

## 2017-08-06 DIAGNOSIS — L98492 Non-pressure chronic ulcer of skin of other sites with fat layer exposed: Secondary | ICD-10-CM | POA: Diagnosis not present

## 2017-08-06 DIAGNOSIS — I5032 Chronic diastolic (congestive) heart failure: Secondary | ICD-10-CM | POA: Diagnosis not present

## 2017-08-06 DIAGNOSIS — S71112D Laceration without foreign body, left thigh, subsequent encounter: Secondary | ICD-10-CM | POA: Diagnosis not present

## 2017-08-06 DIAGNOSIS — L89152 Pressure ulcer of sacral region, stage 2: Secondary | ICD-10-CM | POA: Diagnosis not present

## 2017-08-06 DIAGNOSIS — Z79810 Long term (current) use of selective estrogen receptor modulators (SERMs): Secondary | ICD-10-CM | POA: Diagnosis not present

## 2017-08-06 DIAGNOSIS — D631 Anemia in chronic kidney disease: Secondary | ICD-10-CM | POA: Diagnosis not present

## 2017-08-06 DIAGNOSIS — L304 Erythema intertrigo: Secondary | ICD-10-CM | POA: Diagnosis not present

## 2017-08-06 DIAGNOSIS — Z4800 Encounter for change or removal of nonsurgical wound dressing: Secondary | ICD-10-CM | POA: Diagnosis not present

## 2017-08-06 DIAGNOSIS — I130 Hypertensive heart and chronic kidney disease with heart failure and stage 1 through stage 4 chronic kidney disease, or unspecified chronic kidney disease: Secondary | ICD-10-CM | POA: Diagnosis not present

## 2017-08-06 DIAGNOSIS — Z9981 Dependence on supplemental oxygen: Secondary | ICD-10-CM | POA: Diagnosis not present

## 2017-08-06 DIAGNOSIS — Z7902 Long term (current) use of antithrombotics/antiplatelets: Secondary | ICD-10-CM | POA: Diagnosis not present

## 2017-08-06 DIAGNOSIS — N183 Chronic kidney disease, stage 3 (moderate): Secondary | ICD-10-CM | POA: Diagnosis not present

## 2017-08-06 DIAGNOSIS — Z794 Long term (current) use of insulin: Secondary | ICD-10-CM | POA: Diagnosis not present

## 2017-08-06 DIAGNOSIS — E1122 Type 2 diabetes mellitus with diabetic chronic kidney disease: Secondary | ICD-10-CM | POA: Diagnosis not present

## 2017-08-06 DIAGNOSIS — Z7982 Long term (current) use of aspirin: Secondary | ICD-10-CM | POA: Diagnosis not present

## 2017-08-07 DIAGNOSIS — L89152 Pressure ulcer of sacral region, stage 2: Secondary | ICD-10-CM | POA: Diagnosis not present

## 2017-08-07 DIAGNOSIS — L98492 Non-pressure chronic ulcer of skin of other sites with fat layer exposed: Secondary | ICD-10-CM | POA: Diagnosis not present

## 2017-08-07 DIAGNOSIS — I5032 Chronic diastolic (congestive) heart failure: Secondary | ICD-10-CM | POA: Diagnosis not present

## 2017-08-07 DIAGNOSIS — S71112D Laceration without foreign body, left thigh, subsequent encounter: Secondary | ICD-10-CM | POA: Diagnosis not present

## 2017-08-07 DIAGNOSIS — Z9981 Dependence on supplemental oxygen: Secondary | ICD-10-CM | POA: Diagnosis not present

## 2017-08-07 DIAGNOSIS — Z7902 Long term (current) use of antithrombotics/antiplatelets: Secondary | ICD-10-CM | POA: Diagnosis not present

## 2017-08-07 DIAGNOSIS — Z7982 Long term (current) use of aspirin: Secondary | ICD-10-CM | POA: Diagnosis not present

## 2017-08-07 DIAGNOSIS — J449 Chronic obstructive pulmonary disease, unspecified: Secondary | ICD-10-CM | POA: Diagnosis not present

## 2017-08-07 DIAGNOSIS — D631 Anemia in chronic kidney disease: Secondary | ICD-10-CM | POA: Diagnosis not present

## 2017-08-07 DIAGNOSIS — Z4800 Encounter for change or removal of nonsurgical wound dressing: Secondary | ICD-10-CM | POA: Diagnosis not present

## 2017-08-07 DIAGNOSIS — N183 Chronic kidney disease, stage 3 (moderate): Secondary | ICD-10-CM | POA: Diagnosis not present

## 2017-08-07 DIAGNOSIS — I130 Hypertensive heart and chronic kidney disease with heart failure and stage 1 through stage 4 chronic kidney disease, or unspecified chronic kidney disease: Secondary | ICD-10-CM | POA: Diagnosis not present

## 2017-08-07 DIAGNOSIS — L304 Erythema intertrigo: Secondary | ICD-10-CM | POA: Diagnosis not present

## 2017-08-07 DIAGNOSIS — Z79810 Long term (current) use of selective estrogen receptor modulators (SERMs): Secondary | ICD-10-CM | POA: Diagnosis not present

## 2017-08-07 DIAGNOSIS — E1122 Type 2 diabetes mellitus with diabetic chronic kidney disease: Secondary | ICD-10-CM | POA: Diagnosis not present

## 2017-08-07 DIAGNOSIS — Z794 Long term (current) use of insulin: Secondary | ICD-10-CM | POA: Diagnosis not present

## 2017-08-07 DIAGNOSIS — Z79891 Long term (current) use of opiate analgesic: Secondary | ICD-10-CM | POA: Diagnosis not present

## 2017-08-10 DIAGNOSIS — Z79891 Long term (current) use of opiate analgesic: Secondary | ICD-10-CM | POA: Diagnosis not present

## 2017-08-10 DIAGNOSIS — Z79810 Long term (current) use of selective estrogen receptor modulators (SERMs): Secondary | ICD-10-CM | POA: Diagnosis not present

## 2017-08-10 DIAGNOSIS — I130 Hypertensive heart and chronic kidney disease with heart failure and stage 1 through stage 4 chronic kidney disease, or unspecified chronic kidney disease: Secondary | ICD-10-CM | POA: Diagnosis not present

## 2017-08-10 DIAGNOSIS — Z794 Long term (current) use of insulin: Secondary | ICD-10-CM | POA: Diagnosis not present

## 2017-08-10 DIAGNOSIS — L304 Erythema intertrigo: Secondary | ICD-10-CM | POA: Diagnosis not present

## 2017-08-10 DIAGNOSIS — S71112D Laceration without foreign body, left thigh, subsequent encounter: Secondary | ICD-10-CM | POA: Diagnosis not present

## 2017-08-10 DIAGNOSIS — E1122 Type 2 diabetes mellitus with diabetic chronic kidney disease: Secondary | ICD-10-CM | POA: Diagnosis not present

## 2017-08-10 DIAGNOSIS — Z7982 Long term (current) use of aspirin: Secondary | ICD-10-CM | POA: Diagnosis not present

## 2017-08-10 DIAGNOSIS — N183 Chronic kidney disease, stage 3 (moderate): Secondary | ICD-10-CM | POA: Diagnosis not present

## 2017-08-10 DIAGNOSIS — Z7902 Long term (current) use of antithrombotics/antiplatelets: Secondary | ICD-10-CM | POA: Diagnosis not present

## 2017-08-10 DIAGNOSIS — I5032 Chronic diastolic (congestive) heart failure: Secondary | ICD-10-CM | POA: Diagnosis not present

## 2017-08-10 DIAGNOSIS — J449 Chronic obstructive pulmonary disease, unspecified: Secondary | ICD-10-CM | POA: Diagnosis not present

## 2017-08-10 DIAGNOSIS — L98492 Non-pressure chronic ulcer of skin of other sites with fat layer exposed: Secondary | ICD-10-CM | POA: Diagnosis not present

## 2017-08-10 DIAGNOSIS — Z4800 Encounter for change or removal of nonsurgical wound dressing: Secondary | ICD-10-CM | POA: Diagnosis not present

## 2017-08-10 DIAGNOSIS — Z9981 Dependence on supplemental oxygen: Secondary | ICD-10-CM | POA: Diagnosis not present

## 2017-08-10 DIAGNOSIS — D631 Anemia in chronic kidney disease: Secondary | ICD-10-CM | POA: Diagnosis not present

## 2017-08-10 DIAGNOSIS — L89152 Pressure ulcer of sacral region, stage 2: Secondary | ICD-10-CM | POA: Diagnosis not present

## 2017-08-11 DIAGNOSIS — L98492 Non-pressure chronic ulcer of skin of other sites with fat layer exposed: Secondary | ICD-10-CM | POA: Diagnosis not present

## 2017-08-11 DIAGNOSIS — Z4800 Encounter for change or removal of nonsurgical wound dressing: Secondary | ICD-10-CM | POA: Diagnosis not present

## 2017-08-11 DIAGNOSIS — Z79810 Long term (current) use of selective estrogen receptor modulators (SERMs): Secondary | ICD-10-CM | POA: Diagnosis not present

## 2017-08-11 DIAGNOSIS — M545 Low back pain: Secondary | ICD-10-CM | POA: Diagnosis not present

## 2017-08-11 DIAGNOSIS — Z7982 Long term (current) use of aspirin: Secondary | ICD-10-CM | POA: Diagnosis not present

## 2017-08-11 DIAGNOSIS — S71112D Laceration without foreign body, left thigh, subsequent encounter: Secondary | ICD-10-CM | POA: Diagnosis not present

## 2017-08-11 DIAGNOSIS — J449 Chronic obstructive pulmonary disease, unspecified: Secondary | ICD-10-CM | POA: Diagnosis not present

## 2017-08-11 DIAGNOSIS — Z7902 Long term (current) use of antithrombotics/antiplatelets: Secondary | ICD-10-CM | POA: Diagnosis not present

## 2017-08-11 DIAGNOSIS — D631 Anemia in chronic kidney disease: Secondary | ICD-10-CM | POA: Diagnosis not present

## 2017-08-11 DIAGNOSIS — E1122 Type 2 diabetes mellitus with diabetic chronic kidney disease: Secondary | ICD-10-CM | POA: Diagnosis not present

## 2017-08-11 DIAGNOSIS — L89152 Pressure ulcer of sacral region, stage 2: Secondary | ICD-10-CM | POA: Diagnosis not present

## 2017-08-11 DIAGNOSIS — Z794 Long term (current) use of insulin: Secondary | ICD-10-CM | POA: Diagnosis not present

## 2017-08-11 DIAGNOSIS — Z9981 Dependence on supplemental oxygen: Secondary | ICD-10-CM | POA: Diagnosis not present

## 2017-08-11 DIAGNOSIS — N183 Chronic kidney disease, stage 3 (moderate): Secondary | ICD-10-CM | POA: Diagnosis not present

## 2017-08-11 DIAGNOSIS — I130 Hypertensive heart and chronic kidney disease with heart failure and stage 1 through stage 4 chronic kidney disease, or unspecified chronic kidney disease: Secondary | ICD-10-CM | POA: Diagnosis not present

## 2017-08-11 DIAGNOSIS — Z79891 Long term (current) use of opiate analgesic: Secondary | ICD-10-CM | POA: Diagnosis not present

## 2017-08-11 DIAGNOSIS — L304 Erythema intertrigo: Secondary | ICD-10-CM | POA: Diagnosis not present

## 2017-08-11 DIAGNOSIS — I5032 Chronic diastolic (congestive) heart failure: Secondary | ICD-10-CM | POA: Diagnosis not present

## 2017-08-13 DIAGNOSIS — Z79891 Long term (current) use of opiate analgesic: Secondary | ICD-10-CM | POA: Diagnosis not present

## 2017-08-13 DIAGNOSIS — E1122 Type 2 diabetes mellitus with diabetic chronic kidney disease: Secondary | ICD-10-CM | POA: Diagnosis not present

## 2017-08-13 DIAGNOSIS — L98492 Non-pressure chronic ulcer of skin of other sites with fat layer exposed: Secondary | ICD-10-CM | POA: Diagnosis not present

## 2017-08-13 DIAGNOSIS — L304 Erythema intertrigo: Secondary | ICD-10-CM | POA: Diagnosis not present

## 2017-08-13 DIAGNOSIS — Z7902 Long term (current) use of antithrombotics/antiplatelets: Secondary | ICD-10-CM | POA: Diagnosis not present

## 2017-08-13 DIAGNOSIS — Z4800 Encounter for change or removal of nonsurgical wound dressing: Secondary | ICD-10-CM | POA: Diagnosis not present

## 2017-08-13 DIAGNOSIS — J449 Chronic obstructive pulmonary disease, unspecified: Secondary | ICD-10-CM | POA: Diagnosis not present

## 2017-08-13 DIAGNOSIS — Z7982 Long term (current) use of aspirin: Secondary | ICD-10-CM | POA: Diagnosis not present

## 2017-08-13 DIAGNOSIS — Z794 Long term (current) use of insulin: Secondary | ICD-10-CM | POA: Diagnosis not present

## 2017-08-13 DIAGNOSIS — D631 Anemia in chronic kidney disease: Secondary | ICD-10-CM | POA: Diagnosis not present

## 2017-08-13 DIAGNOSIS — I130 Hypertensive heart and chronic kidney disease with heart failure and stage 1 through stage 4 chronic kidney disease, or unspecified chronic kidney disease: Secondary | ICD-10-CM | POA: Diagnosis not present

## 2017-08-13 DIAGNOSIS — Z79810 Long term (current) use of selective estrogen receptor modulators (SERMs): Secondary | ICD-10-CM | POA: Diagnosis not present

## 2017-08-13 DIAGNOSIS — Z9981 Dependence on supplemental oxygen: Secondary | ICD-10-CM | POA: Diagnosis not present

## 2017-08-13 DIAGNOSIS — N183 Chronic kidney disease, stage 3 (moderate): Secondary | ICD-10-CM | POA: Diagnosis not present

## 2017-08-13 DIAGNOSIS — L89152 Pressure ulcer of sacral region, stage 2: Secondary | ICD-10-CM | POA: Diagnosis not present

## 2017-08-13 DIAGNOSIS — S71112D Laceration without foreign body, left thigh, subsequent encounter: Secondary | ICD-10-CM | POA: Diagnosis not present

## 2017-08-13 DIAGNOSIS — I5032 Chronic diastolic (congestive) heart failure: Secondary | ICD-10-CM | POA: Diagnosis not present

## 2017-08-14 DIAGNOSIS — L304 Erythema intertrigo: Secondary | ICD-10-CM | POA: Diagnosis not present

## 2017-08-14 DIAGNOSIS — Z9981 Dependence on supplemental oxygen: Secondary | ICD-10-CM | POA: Diagnosis not present

## 2017-08-14 DIAGNOSIS — D631 Anemia in chronic kidney disease: Secondary | ICD-10-CM | POA: Diagnosis not present

## 2017-08-14 DIAGNOSIS — Z4800 Encounter for change or removal of nonsurgical wound dressing: Secondary | ICD-10-CM | POA: Diagnosis not present

## 2017-08-14 DIAGNOSIS — I5032 Chronic diastolic (congestive) heart failure: Secondary | ICD-10-CM | POA: Diagnosis not present

## 2017-08-14 DIAGNOSIS — Z79810 Long term (current) use of selective estrogen receptor modulators (SERMs): Secondary | ICD-10-CM | POA: Diagnosis not present

## 2017-08-14 DIAGNOSIS — Z7902 Long term (current) use of antithrombotics/antiplatelets: Secondary | ICD-10-CM | POA: Diagnosis not present

## 2017-08-14 DIAGNOSIS — Z79891 Long term (current) use of opiate analgesic: Secondary | ICD-10-CM | POA: Diagnosis not present

## 2017-08-14 DIAGNOSIS — J449 Chronic obstructive pulmonary disease, unspecified: Secondary | ICD-10-CM | POA: Diagnosis not present

## 2017-08-14 DIAGNOSIS — Z794 Long term (current) use of insulin: Secondary | ICD-10-CM | POA: Diagnosis not present

## 2017-08-14 DIAGNOSIS — L98492 Non-pressure chronic ulcer of skin of other sites with fat layer exposed: Secondary | ICD-10-CM | POA: Diagnosis not present

## 2017-08-14 DIAGNOSIS — N183 Chronic kidney disease, stage 3 (moderate): Secondary | ICD-10-CM | POA: Diagnosis not present

## 2017-08-14 DIAGNOSIS — S71112D Laceration without foreign body, left thigh, subsequent encounter: Secondary | ICD-10-CM | POA: Diagnosis not present

## 2017-08-14 DIAGNOSIS — I130 Hypertensive heart and chronic kidney disease with heart failure and stage 1 through stage 4 chronic kidney disease, or unspecified chronic kidney disease: Secondary | ICD-10-CM | POA: Diagnosis not present

## 2017-08-14 DIAGNOSIS — L89152 Pressure ulcer of sacral region, stage 2: Secondary | ICD-10-CM | POA: Diagnosis not present

## 2017-08-14 DIAGNOSIS — E1122 Type 2 diabetes mellitus with diabetic chronic kidney disease: Secondary | ICD-10-CM | POA: Diagnosis not present

## 2017-08-14 DIAGNOSIS — Z7982 Long term (current) use of aspirin: Secondary | ICD-10-CM | POA: Diagnosis not present

## 2017-08-17 DIAGNOSIS — N183 Chronic kidney disease, stage 3 (moderate): Secondary | ICD-10-CM | POA: Diagnosis not present

## 2017-08-17 DIAGNOSIS — Z4800 Encounter for change or removal of nonsurgical wound dressing: Secondary | ICD-10-CM | POA: Diagnosis not present

## 2017-08-17 DIAGNOSIS — L304 Erythema intertrigo: Secondary | ICD-10-CM | POA: Diagnosis not present

## 2017-08-17 DIAGNOSIS — D631 Anemia in chronic kidney disease: Secondary | ICD-10-CM | POA: Diagnosis not present

## 2017-08-17 DIAGNOSIS — Z7982 Long term (current) use of aspirin: Secondary | ICD-10-CM | POA: Diagnosis not present

## 2017-08-17 DIAGNOSIS — Z794 Long term (current) use of insulin: Secondary | ICD-10-CM | POA: Diagnosis not present

## 2017-08-17 DIAGNOSIS — L98492 Non-pressure chronic ulcer of skin of other sites with fat layer exposed: Secondary | ICD-10-CM | POA: Diagnosis not present

## 2017-08-17 DIAGNOSIS — J449 Chronic obstructive pulmonary disease, unspecified: Secondary | ICD-10-CM | POA: Diagnosis not present

## 2017-08-17 DIAGNOSIS — L89152 Pressure ulcer of sacral region, stage 2: Secondary | ICD-10-CM | POA: Diagnosis not present

## 2017-08-17 DIAGNOSIS — Z9981 Dependence on supplemental oxygen: Secondary | ICD-10-CM | POA: Diagnosis not present

## 2017-08-17 DIAGNOSIS — E1122 Type 2 diabetes mellitus with diabetic chronic kidney disease: Secondary | ICD-10-CM | POA: Diagnosis not present

## 2017-08-17 DIAGNOSIS — Z79810 Long term (current) use of selective estrogen receptor modulators (SERMs): Secondary | ICD-10-CM | POA: Diagnosis not present

## 2017-08-17 DIAGNOSIS — Z79891 Long term (current) use of opiate analgesic: Secondary | ICD-10-CM | POA: Diagnosis not present

## 2017-08-17 DIAGNOSIS — I5032 Chronic diastolic (congestive) heart failure: Secondary | ICD-10-CM | POA: Diagnosis not present

## 2017-08-17 DIAGNOSIS — S71112D Laceration without foreign body, left thigh, subsequent encounter: Secondary | ICD-10-CM | POA: Diagnosis not present

## 2017-08-17 DIAGNOSIS — I130 Hypertensive heart and chronic kidney disease with heart failure and stage 1 through stage 4 chronic kidney disease, or unspecified chronic kidney disease: Secondary | ICD-10-CM | POA: Diagnosis not present

## 2017-08-17 DIAGNOSIS — Z7902 Long term (current) use of antithrombotics/antiplatelets: Secondary | ICD-10-CM | POA: Diagnosis not present

## 2017-08-18 DIAGNOSIS — J449 Chronic obstructive pulmonary disease, unspecified: Secondary | ICD-10-CM | POA: Diagnosis not present

## 2017-08-18 DIAGNOSIS — E1122 Type 2 diabetes mellitus with diabetic chronic kidney disease: Secondary | ICD-10-CM | POA: Diagnosis not present

## 2017-08-18 DIAGNOSIS — Z4800 Encounter for change or removal of nonsurgical wound dressing: Secondary | ICD-10-CM | POA: Diagnosis not present

## 2017-08-18 DIAGNOSIS — Z7902 Long term (current) use of antithrombotics/antiplatelets: Secondary | ICD-10-CM | POA: Diagnosis not present

## 2017-08-18 DIAGNOSIS — S71112D Laceration without foreign body, left thigh, subsequent encounter: Secondary | ICD-10-CM | POA: Diagnosis not present

## 2017-08-18 DIAGNOSIS — Z7982 Long term (current) use of aspirin: Secondary | ICD-10-CM | POA: Diagnosis not present

## 2017-08-18 DIAGNOSIS — Z79810 Long term (current) use of selective estrogen receptor modulators (SERMs): Secondary | ICD-10-CM | POA: Diagnosis not present

## 2017-08-18 DIAGNOSIS — I130 Hypertensive heart and chronic kidney disease with heart failure and stage 1 through stage 4 chronic kidney disease, or unspecified chronic kidney disease: Secondary | ICD-10-CM | POA: Diagnosis not present

## 2017-08-18 DIAGNOSIS — Z79891 Long term (current) use of opiate analgesic: Secondary | ICD-10-CM | POA: Diagnosis not present

## 2017-08-18 DIAGNOSIS — I5032 Chronic diastolic (congestive) heart failure: Secondary | ICD-10-CM | POA: Diagnosis not present

## 2017-08-18 DIAGNOSIS — Z794 Long term (current) use of insulin: Secondary | ICD-10-CM | POA: Diagnosis not present

## 2017-08-18 DIAGNOSIS — N183 Chronic kidney disease, stage 3 (moderate): Secondary | ICD-10-CM | POA: Diagnosis not present

## 2017-08-18 DIAGNOSIS — D631 Anemia in chronic kidney disease: Secondary | ICD-10-CM | POA: Diagnosis not present

## 2017-08-18 DIAGNOSIS — L98492 Non-pressure chronic ulcer of skin of other sites with fat layer exposed: Secondary | ICD-10-CM | POA: Diagnosis not present

## 2017-08-18 DIAGNOSIS — L304 Erythema intertrigo: Secondary | ICD-10-CM | POA: Diagnosis not present

## 2017-08-18 DIAGNOSIS — Z9981 Dependence on supplemental oxygen: Secondary | ICD-10-CM | POA: Diagnosis not present

## 2017-08-18 DIAGNOSIS — L89152 Pressure ulcer of sacral region, stage 2: Secondary | ICD-10-CM | POA: Diagnosis not present

## 2017-08-21 DIAGNOSIS — Z7982 Long term (current) use of aspirin: Secondary | ICD-10-CM | POA: Diagnosis not present

## 2017-08-21 DIAGNOSIS — E1122 Type 2 diabetes mellitus with diabetic chronic kidney disease: Secondary | ICD-10-CM | POA: Diagnosis not present

## 2017-08-21 DIAGNOSIS — D631 Anemia in chronic kidney disease: Secondary | ICD-10-CM | POA: Diagnosis not present

## 2017-08-21 DIAGNOSIS — Z7902 Long term (current) use of antithrombotics/antiplatelets: Secondary | ICD-10-CM | POA: Diagnosis not present

## 2017-08-21 DIAGNOSIS — S71112D Laceration without foreign body, left thigh, subsequent encounter: Secondary | ICD-10-CM | POA: Diagnosis not present

## 2017-08-21 DIAGNOSIS — Z79810 Long term (current) use of selective estrogen receptor modulators (SERMs): Secondary | ICD-10-CM | POA: Diagnosis not present

## 2017-08-21 DIAGNOSIS — I5032 Chronic diastolic (congestive) heart failure: Secondary | ICD-10-CM | POA: Diagnosis not present

## 2017-08-21 DIAGNOSIS — J449 Chronic obstructive pulmonary disease, unspecified: Secondary | ICD-10-CM | POA: Diagnosis not present

## 2017-08-21 DIAGNOSIS — L98492 Non-pressure chronic ulcer of skin of other sites with fat layer exposed: Secondary | ICD-10-CM | POA: Diagnosis not present

## 2017-08-21 DIAGNOSIS — I130 Hypertensive heart and chronic kidney disease with heart failure and stage 1 through stage 4 chronic kidney disease, or unspecified chronic kidney disease: Secondary | ICD-10-CM | POA: Diagnosis not present

## 2017-08-21 DIAGNOSIS — Z79891 Long term (current) use of opiate analgesic: Secondary | ICD-10-CM | POA: Diagnosis not present

## 2017-08-21 DIAGNOSIS — Z4800 Encounter for change or removal of nonsurgical wound dressing: Secondary | ICD-10-CM | POA: Diagnosis not present

## 2017-08-21 DIAGNOSIS — Z794 Long term (current) use of insulin: Secondary | ICD-10-CM | POA: Diagnosis not present

## 2017-08-21 DIAGNOSIS — L304 Erythema intertrigo: Secondary | ICD-10-CM | POA: Diagnosis not present

## 2017-08-21 DIAGNOSIS — L89152 Pressure ulcer of sacral region, stage 2: Secondary | ICD-10-CM | POA: Diagnosis not present

## 2017-08-21 DIAGNOSIS — N183 Chronic kidney disease, stage 3 (moderate): Secondary | ICD-10-CM | POA: Diagnosis not present

## 2017-08-21 DIAGNOSIS — Z9981 Dependence on supplemental oxygen: Secondary | ICD-10-CM | POA: Diagnosis not present

## 2017-08-24 DIAGNOSIS — Z79810 Long term (current) use of selective estrogen receptor modulators (SERMs): Secondary | ICD-10-CM | POA: Diagnosis not present

## 2017-08-24 DIAGNOSIS — L98492 Non-pressure chronic ulcer of skin of other sites with fat layer exposed: Secondary | ICD-10-CM | POA: Diagnosis not present

## 2017-08-24 DIAGNOSIS — I5032 Chronic diastolic (congestive) heart failure: Secondary | ICD-10-CM | POA: Diagnosis not present

## 2017-08-24 DIAGNOSIS — E1122 Type 2 diabetes mellitus with diabetic chronic kidney disease: Secondary | ICD-10-CM | POA: Diagnosis not present

## 2017-08-24 DIAGNOSIS — Z7982 Long term (current) use of aspirin: Secondary | ICD-10-CM | POA: Diagnosis not present

## 2017-08-24 DIAGNOSIS — J449 Chronic obstructive pulmonary disease, unspecified: Secondary | ICD-10-CM | POA: Diagnosis not present

## 2017-08-24 DIAGNOSIS — Z79891 Long term (current) use of opiate analgesic: Secondary | ICD-10-CM | POA: Diagnosis not present

## 2017-08-24 DIAGNOSIS — L304 Erythema intertrigo: Secondary | ICD-10-CM | POA: Diagnosis not present

## 2017-08-24 DIAGNOSIS — Z794 Long term (current) use of insulin: Secondary | ICD-10-CM | POA: Diagnosis not present

## 2017-08-24 DIAGNOSIS — S71112D Laceration without foreign body, left thigh, subsequent encounter: Secondary | ICD-10-CM | POA: Diagnosis not present

## 2017-08-24 DIAGNOSIS — Z7902 Long term (current) use of antithrombotics/antiplatelets: Secondary | ICD-10-CM | POA: Diagnosis not present

## 2017-08-24 DIAGNOSIS — I130 Hypertensive heart and chronic kidney disease with heart failure and stage 1 through stage 4 chronic kidney disease, or unspecified chronic kidney disease: Secondary | ICD-10-CM | POA: Diagnosis not present

## 2017-08-24 DIAGNOSIS — L89152 Pressure ulcer of sacral region, stage 2: Secondary | ICD-10-CM | POA: Diagnosis not present

## 2017-08-24 DIAGNOSIS — N183 Chronic kidney disease, stage 3 (moderate): Secondary | ICD-10-CM | POA: Diagnosis not present

## 2017-08-24 DIAGNOSIS — Z4800 Encounter for change or removal of nonsurgical wound dressing: Secondary | ICD-10-CM | POA: Diagnosis not present

## 2017-08-24 DIAGNOSIS — D631 Anemia in chronic kidney disease: Secondary | ICD-10-CM | POA: Diagnosis not present

## 2017-08-24 DIAGNOSIS — Z9981 Dependence on supplemental oxygen: Secondary | ICD-10-CM | POA: Diagnosis not present

## 2017-08-25 ENCOUNTER — Other Ambulatory Visit: Payer: Self-pay | Admitting: Cardiovascular Disease

## 2017-08-25 DIAGNOSIS — I5032 Chronic diastolic (congestive) heart failure: Secondary | ICD-10-CM | POA: Diagnosis not present

## 2017-08-25 DIAGNOSIS — Z9981 Dependence on supplemental oxygen: Secondary | ICD-10-CM | POA: Diagnosis not present

## 2017-08-25 DIAGNOSIS — D631 Anemia in chronic kidney disease: Secondary | ICD-10-CM | POA: Diagnosis not present

## 2017-08-25 DIAGNOSIS — N183 Chronic kidney disease, stage 3 (moderate): Secondary | ICD-10-CM | POA: Diagnosis not present

## 2017-08-25 DIAGNOSIS — Z7902 Long term (current) use of antithrombotics/antiplatelets: Secondary | ICD-10-CM | POA: Diagnosis not present

## 2017-08-25 DIAGNOSIS — J449 Chronic obstructive pulmonary disease, unspecified: Secondary | ICD-10-CM | POA: Diagnosis not present

## 2017-08-25 DIAGNOSIS — Z794 Long term (current) use of insulin: Secondary | ICD-10-CM | POA: Diagnosis not present

## 2017-08-25 DIAGNOSIS — E1122 Type 2 diabetes mellitus with diabetic chronic kidney disease: Secondary | ICD-10-CM | POA: Diagnosis not present

## 2017-08-25 DIAGNOSIS — I130 Hypertensive heart and chronic kidney disease with heart failure and stage 1 through stage 4 chronic kidney disease, or unspecified chronic kidney disease: Secondary | ICD-10-CM | POA: Diagnosis not present

## 2017-08-25 DIAGNOSIS — Z7982 Long term (current) use of aspirin: Secondary | ICD-10-CM | POA: Diagnosis not present

## 2017-08-25 DIAGNOSIS — S71112D Laceration without foreign body, left thigh, subsequent encounter: Secondary | ICD-10-CM | POA: Diagnosis not present

## 2017-08-25 DIAGNOSIS — Z79810 Long term (current) use of selective estrogen receptor modulators (SERMs): Secondary | ICD-10-CM | POA: Diagnosis not present

## 2017-08-25 DIAGNOSIS — Z79891 Long term (current) use of opiate analgesic: Secondary | ICD-10-CM | POA: Diagnosis not present

## 2017-08-25 DIAGNOSIS — L304 Erythema intertrigo: Secondary | ICD-10-CM | POA: Diagnosis not present

## 2017-08-25 DIAGNOSIS — L89152 Pressure ulcer of sacral region, stage 2: Secondary | ICD-10-CM | POA: Diagnosis not present

## 2017-08-25 DIAGNOSIS — Z4800 Encounter for change or removal of nonsurgical wound dressing: Secondary | ICD-10-CM | POA: Diagnosis not present

## 2017-08-25 DIAGNOSIS — L98492 Non-pressure chronic ulcer of skin of other sites with fat layer exposed: Secondary | ICD-10-CM | POA: Diagnosis not present

## 2017-08-28 DIAGNOSIS — Z794 Long term (current) use of insulin: Secondary | ICD-10-CM | POA: Diagnosis not present

## 2017-08-28 DIAGNOSIS — L304 Erythema intertrigo: Secondary | ICD-10-CM | POA: Diagnosis not present

## 2017-08-28 DIAGNOSIS — I130 Hypertensive heart and chronic kidney disease with heart failure and stage 1 through stage 4 chronic kidney disease, or unspecified chronic kidney disease: Secondary | ICD-10-CM | POA: Diagnosis not present

## 2017-08-28 DIAGNOSIS — D631 Anemia in chronic kidney disease: Secondary | ICD-10-CM | POA: Diagnosis not present

## 2017-08-28 DIAGNOSIS — Z9981 Dependence on supplemental oxygen: Secondary | ICD-10-CM | POA: Diagnosis not present

## 2017-08-28 DIAGNOSIS — Z7902 Long term (current) use of antithrombotics/antiplatelets: Secondary | ICD-10-CM | POA: Diagnosis not present

## 2017-08-28 DIAGNOSIS — Z79891 Long term (current) use of opiate analgesic: Secondary | ICD-10-CM | POA: Diagnosis not present

## 2017-08-28 DIAGNOSIS — Z7982 Long term (current) use of aspirin: Secondary | ICD-10-CM | POA: Diagnosis not present

## 2017-08-28 DIAGNOSIS — L98492 Non-pressure chronic ulcer of skin of other sites with fat layer exposed: Secondary | ICD-10-CM | POA: Diagnosis not present

## 2017-08-28 DIAGNOSIS — L89152 Pressure ulcer of sacral region, stage 2: Secondary | ICD-10-CM | POA: Diagnosis not present

## 2017-08-28 DIAGNOSIS — Z79810 Long term (current) use of selective estrogen receptor modulators (SERMs): Secondary | ICD-10-CM | POA: Diagnosis not present

## 2017-08-28 DIAGNOSIS — E1122 Type 2 diabetes mellitus with diabetic chronic kidney disease: Secondary | ICD-10-CM | POA: Diagnosis not present

## 2017-08-28 DIAGNOSIS — S71112D Laceration without foreign body, left thigh, subsequent encounter: Secondary | ICD-10-CM | POA: Diagnosis not present

## 2017-08-28 DIAGNOSIS — I5032 Chronic diastolic (congestive) heart failure: Secondary | ICD-10-CM | POA: Diagnosis not present

## 2017-08-28 DIAGNOSIS — Z4800 Encounter for change or removal of nonsurgical wound dressing: Secondary | ICD-10-CM | POA: Diagnosis not present

## 2017-08-28 DIAGNOSIS — N183 Chronic kidney disease, stage 3 (moderate): Secondary | ICD-10-CM | POA: Diagnosis not present

## 2017-08-28 DIAGNOSIS — J449 Chronic obstructive pulmonary disease, unspecified: Secondary | ICD-10-CM | POA: Diagnosis not present

## 2017-08-31 DIAGNOSIS — L89152 Pressure ulcer of sacral region, stage 2: Secondary | ICD-10-CM | POA: Diagnosis not present

## 2017-08-31 DIAGNOSIS — Z9981 Dependence on supplemental oxygen: Secondary | ICD-10-CM | POA: Diagnosis not present

## 2017-08-31 DIAGNOSIS — Z7902 Long term (current) use of antithrombotics/antiplatelets: Secondary | ICD-10-CM | POA: Diagnosis not present

## 2017-08-31 DIAGNOSIS — L98492 Non-pressure chronic ulcer of skin of other sites with fat layer exposed: Secondary | ICD-10-CM | POA: Diagnosis not present

## 2017-08-31 DIAGNOSIS — J449 Chronic obstructive pulmonary disease, unspecified: Secondary | ICD-10-CM | POA: Diagnosis not present

## 2017-08-31 DIAGNOSIS — Z794 Long term (current) use of insulin: Secondary | ICD-10-CM | POA: Diagnosis not present

## 2017-08-31 DIAGNOSIS — N183 Chronic kidney disease, stage 3 (moderate): Secondary | ICD-10-CM | POA: Diagnosis not present

## 2017-08-31 DIAGNOSIS — D631 Anemia in chronic kidney disease: Secondary | ICD-10-CM | POA: Diagnosis not present

## 2017-08-31 DIAGNOSIS — I130 Hypertensive heart and chronic kidney disease with heart failure and stage 1 through stage 4 chronic kidney disease, or unspecified chronic kidney disease: Secondary | ICD-10-CM | POA: Diagnosis not present

## 2017-08-31 DIAGNOSIS — S71112D Laceration without foreign body, left thigh, subsequent encounter: Secondary | ICD-10-CM | POA: Diagnosis not present

## 2017-08-31 DIAGNOSIS — Z4800 Encounter for change or removal of nonsurgical wound dressing: Secondary | ICD-10-CM | POA: Diagnosis not present

## 2017-08-31 DIAGNOSIS — Z79891 Long term (current) use of opiate analgesic: Secondary | ICD-10-CM | POA: Diagnosis not present

## 2017-08-31 DIAGNOSIS — I5032 Chronic diastolic (congestive) heart failure: Secondary | ICD-10-CM | POA: Diagnosis not present

## 2017-08-31 DIAGNOSIS — L304 Erythema intertrigo: Secondary | ICD-10-CM | POA: Diagnosis not present

## 2017-08-31 DIAGNOSIS — E1122 Type 2 diabetes mellitus with diabetic chronic kidney disease: Secondary | ICD-10-CM | POA: Diagnosis not present

## 2017-08-31 DIAGNOSIS — Z79810 Long term (current) use of selective estrogen receptor modulators (SERMs): Secondary | ICD-10-CM | POA: Diagnosis not present

## 2017-08-31 DIAGNOSIS — Z7982 Long term (current) use of aspirin: Secondary | ICD-10-CM | POA: Diagnosis not present

## 2017-09-01 DIAGNOSIS — N183 Chronic kidney disease, stage 3 (moderate): Secondary | ICD-10-CM | POA: Diagnosis not present

## 2017-09-01 DIAGNOSIS — L304 Erythema intertrigo: Secondary | ICD-10-CM | POA: Diagnosis not present

## 2017-09-01 DIAGNOSIS — Z4800 Encounter for change or removal of nonsurgical wound dressing: Secondary | ICD-10-CM | POA: Diagnosis not present

## 2017-09-01 DIAGNOSIS — Z79891 Long term (current) use of opiate analgesic: Secondary | ICD-10-CM | POA: Diagnosis not present

## 2017-09-01 DIAGNOSIS — Z7982 Long term (current) use of aspirin: Secondary | ICD-10-CM | POA: Diagnosis not present

## 2017-09-01 DIAGNOSIS — Z7902 Long term (current) use of antithrombotics/antiplatelets: Secondary | ICD-10-CM | POA: Diagnosis not present

## 2017-09-01 DIAGNOSIS — Z9981 Dependence on supplemental oxygen: Secondary | ICD-10-CM | POA: Diagnosis not present

## 2017-09-01 DIAGNOSIS — Z794 Long term (current) use of insulin: Secondary | ICD-10-CM | POA: Diagnosis not present

## 2017-09-01 DIAGNOSIS — J449 Chronic obstructive pulmonary disease, unspecified: Secondary | ICD-10-CM | POA: Diagnosis not present

## 2017-09-01 DIAGNOSIS — Z79810 Long term (current) use of selective estrogen receptor modulators (SERMs): Secondary | ICD-10-CM | POA: Diagnosis not present

## 2017-09-01 DIAGNOSIS — I5032 Chronic diastolic (congestive) heart failure: Secondary | ICD-10-CM | POA: Diagnosis not present

## 2017-09-01 DIAGNOSIS — S71112D Laceration without foreign body, left thigh, subsequent encounter: Secondary | ICD-10-CM | POA: Diagnosis not present

## 2017-09-01 DIAGNOSIS — L98492 Non-pressure chronic ulcer of skin of other sites with fat layer exposed: Secondary | ICD-10-CM | POA: Diagnosis not present

## 2017-09-01 DIAGNOSIS — E1122 Type 2 diabetes mellitus with diabetic chronic kidney disease: Secondary | ICD-10-CM | POA: Diagnosis not present

## 2017-09-01 DIAGNOSIS — L89152 Pressure ulcer of sacral region, stage 2: Secondary | ICD-10-CM | POA: Diagnosis not present

## 2017-09-01 DIAGNOSIS — I130 Hypertensive heart and chronic kidney disease with heart failure and stage 1 through stage 4 chronic kidney disease, or unspecified chronic kidney disease: Secondary | ICD-10-CM | POA: Diagnosis not present

## 2017-09-01 DIAGNOSIS — D631 Anemia in chronic kidney disease: Secondary | ICD-10-CM | POA: Diagnosis not present

## 2017-09-02 ENCOUNTER — Other Ambulatory Visit: Payer: Self-pay

## 2017-09-02 ENCOUNTER — Emergency Department (HOSPITAL_COMMUNITY): Payer: Medicare Other

## 2017-09-02 ENCOUNTER — Encounter (HOSPITAL_COMMUNITY): Payer: Self-pay | Admitting: Emergency Medicine

## 2017-09-02 ENCOUNTER — Observation Stay (HOSPITAL_COMMUNITY)
Admission: EM | Admit: 2017-09-02 | Discharge: 2017-09-04 | Disposition: A | Discharge status: Home / Self Care | Payer: Medicare Other | Attending: Internal Medicine | Admitting: Internal Medicine

## 2017-09-02 DIAGNOSIS — I498 Other specified cardiac arrhythmias: Secondary | ICD-10-CM | POA: Diagnosis present

## 2017-09-02 DIAGNOSIS — L89309 Pressure ulcer of unspecified buttock, unspecified stage: Secondary | ICD-10-CM | POA: Diagnosis not present

## 2017-09-02 DIAGNOSIS — Z79899 Other long term (current) drug therapy: Secondary | ICD-10-CM | POA: Diagnosis not present

## 2017-09-02 DIAGNOSIS — R4020 Unspecified coma: Secondary | ICD-10-CM | POA: Diagnosis not present

## 2017-09-02 DIAGNOSIS — I259 Chronic ischemic heart disease, unspecified: Secondary | ICD-10-CM | POA: Insufficient documentation

## 2017-09-02 DIAGNOSIS — I5032 Chronic diastolic (congestive) heart failure: Secondary | ICD-10-CM | POA: Diagnosis not present

## 2017-09-02 DIAGNOSIS — J449 Chronic obstructive pulmonary disease, unspecified: Secondary | ICD-10-CM | POA: Diagnosis present

## 2017-09-02 DIAGNOSIS — N183 Chronic kidney disease, stage 3 unspecified: Secondary | ICD-10-CM | POA: Diagnosis present

## 2017-09-02 DIAGNOSIS — L98499 Non-pressure chronic ulcer of skin of other sites with unspecified severity: Secondary | ICD-10-CM | POA: Diagnosis present

## 2017-09-02 DIAGNOSIS — G9341 Metabolic encephalopathy: Secondary | ICD-10-CM | POA: Insufficient documentation

## 2017-09-02 DIAGNOSIS — R531 Weakness: Secondary | ICD-10-CM | POA: Insufficient documentation

## 2017-09-02 DIAGNOSIS — E1165 Type 2 diabetes mellitus with hyperglycemia: Secondary | ICD-10-CM | POA: Insufficient documentation

## 2017-09-02 DIAGNOSIS — G9340 Encephalopathy, unspecified: Secondary | ICD-10-CM

## 2017-09-02 DIAGNOSIS — E039 Hypothyroidism, unspecified: Secondary | ICD-10-CM | POA: Diagnosis not present

## 2017-09-02 DIAGNOSIS — Z87891 Personal history of nicotine dependence: Secondary | ICD-10-CM | POA: Diagnosis not present

## 2017-09-02 DIAGNOSIS — E119 Type 2 diabetes mellitus without complications: Secondary | ICD-10-CM

## 2017-09-02 DIAGNOSIS — N189 Chronic kidney disease, unspecified: Secondary | ICD-10-CM

## 2017-09-02 DIAGNOSIS — R600 Localized edema: Secondary | ICD-10-CM | POA: Diagnosis present

## 2017-09-02 DIAGNOSIS — D631 Anemia in chronic kidney disease: Secondary | ICD-10-CM | POA: Diagnosis not present

## 2017-09-02 DIAGNOSIS — Z794 Long term (current) use of insulin: Secondary | ICD-10-CM | POA: Insufficient documentation

## 2017-09-02 DIAGNOSIS — I130 Hypertensive heart and chronic kidney disease with heart failure and stage 1 through stage 4 chronic kidney disease, or unspecified chronic kidney disease: Secondary | ICD-10-CM | POA: Diagnosis not present

## 2017-09-02 DIAGNOSIS — R001 Bradycardia, unspecified: Secondary | ICD-10-CM

## 2017-09-02 DIAGNOSIS — R55 Syncope and collapse: Principal | ICD-10-CM | POA: Diagnosis present

## 2017-09-02 DIAGNOSIS — I10 Essential (primary) hypertension: Secondary | ICD-10-CM | POA: Diagnosis present

## 2017-09-02 DIAGNOSIS — E66813 Obesity, class 3: Secondary | ICD-10-CM | POA: Diagnosis present

## 2017-09-02 DIAGNOSIS — I951 Orthostatic hypotension: Secondary | ICD-10-CM | POA: Diagnosis present

## 2017-09-02 DIAGNOSIS — N390 Urinary tract infection, site not specified: Secondary | ICD-10-CM | POA: Diagnosis not present

## 2017-09-02 LAB — CBC WITH DIFFERENTIAL/PLATELET
Basophils Absolute: 0.1 10*3/uL (ref 0.0–0.1)
Basophils Relative: 1 %
Eosinophils Absolute: 0.3 10*3/uL (ref 0.0–0.7)
Eosinophils Relative: 4 %
HCT: 33.4 % — ABNORMAL LOW (ref 36.0–46.0)
Hemoglobin: 10.6 g/dL — ABNORMAL LOW (ref 12.0–15.0)
Lymphocytes Relative: 28 %
Lymphs Abs: 2.1 10*3/uL (ref 0.7–4.0)
MCH: 28 pg (ref 26.0–34.0)
MCHC: 31.7 g/dL (ref 30.0–36.0)
MCV: 88.4 fL (ref 78.0–100.0)
Monocytes Absolute: 0.6 10*3/uL (ref 0.1–1.0)
Monocytes Relative: 8 %
Neutro Abs: 4.3 10*3/uL (ref 1.7–7.7)
Neutrophils Relative %: 59 %
Platelets: 284 10*3/uL (ref 150–400)
RBC: 3.78 MIL/uL — ABNORMAL LOW (ref 3.87–5.11)
RDW: 14.4 % (ref 11.5–15.5)
WBC: 7.3 10*3/uL (ref 4.0–10.5)

## 2017-09-02 LAB — BLOOD GAS, VENOUS
Acid-base deficit: 0.1 mmol/L (ref 0.0–2.0)
Bicarbonate: 23.9 mmol/L (ref 20.0–28.0)
Drawn by: 28459
pCO2, Ven: 47.9 mmHg (ref 44.0–60.0)
pH, Ven: 7.337 (ref 7.250–7.430)

## 2017-09-02 LAB — RAPID URINE DRUG SCREEN, HOSP PERFORMED
Amphetamines: NOT DETECTED
Barbiturates: NOT DETECTED
Benzodiazepines: POSITIVE — AB
Cocaine: NOT DETECTED
Opiates: NOT DETECTED
Tetrahydrocannabinol: NOT DETECTED

## 2017-09-02 LAB — COMPREHENSIVE METABOLIC PANEL
ALT: 12 U/L — ABNORMAL LOW (ref 14–54)
AST: 17 U/L (ref 15–41)
Albumin: 3.5 g/dL (ref 3.5–5.0)
Alkaline Phosphatase: 91 U/L (ref 38–126)
Anion gap: 9 (ref 5–15)
BUN: 24 mg/dL — ABNORMAL HIGH (ref 6–20)
CO2: 25 mmol/L (ref 22–32)
Calcium: 8.9 mg/dL (ref 8.9–10.3)
Chloride: 101 mmol/L (ref 101–111)
Creatinine, Ser: 1.45 mg/dL — ABNORMAL HIGH (ref 0.44–1.00)
GFR calc Af Amer: 42 mL/min — ABNORMAL LOW (ref >60)
GFR calc non Af Amer: 36 mL/min — ABNORMAL LOW (ref >60)
Glucose, Bld: 233 mg/dL — ABNORMAL HIGH (ref 65–99)
Potassium: 4.2 mmol/L (ref 3.5–5.1)
Sodium: 135 mmol/L (ref 135–145)
Total Bilirubin: 0.7 mg/dL (ref 0.3–1.2)
Total Protein: 6.4 g/dL — ABNORMAL LOW (ref 6.5–8.1)

## 2017-09-02 LAB — URINALYSIS, ROUTINE W REFLEX MICROSCOPIC
Bilirubin Urine: NEGATIVE
Glucose, UA: NEGATIVE mg/dL
Ketones, ur: NEGATIVE mg/dL
Leukocytes, UA: NEGATIVE
Nitrite: NEGATIVE
Protein, ur: NEGATIVE mg/dL
Specific Gravity, Urine: 1.013 (ref 1.005–1.030)
pH: 5 (ref 5.0–8.0)

## 2017-09-02 LAB — TSH: TSH: 0.052 u[IU]/mL — ABNORMAL LOW (ref 0.350–4.500)

## 2017-09-02 LAB — I-STAT CG4 LACTIC ACID, ED: Lactic Acid, Venous: 1.52 mmol/L (ref 0.5–1.9)

## 2017-09-02 LAB — AMMONIA: Ammonia: 10 umol/L (ref 9–35)

## 2017-09-02 LAB — ETHANOL: Alcohol, Ethyl (B): <10 mg/dL (ref <10)

## 2017-09-02 MED ORDER — ASPIRIN EC 81 MG PO TBEC
81.0000 mg | DELAYED_RELEASE_TABLET | Freq: Every day | ORAL | Status: DC
  Administered 2017-09-03 – 2017-09-04 (×2): 81 mg via ORAL
  Filled 2017-09-02 (×2): qty 1

## 2017-09-02 MED ORDER — SODIUM CHLORIDE 0.9 % IV BOLUS
500.0000 mL | Freq: Once | INTRAVENOUS | Status: AC
  Administered 2017-09-02: 500 mL via INTRAVENOUS

## 2017-09-02 MED ORDER — ATORVASTATIN CALCIUM 40 MG PO TABS
80.0000 mg | ORAL_TABLET | Freq: Every day | ORAL | Status: DC
  Administered 2017-09-03 – 2017-09-04 (×2): 80 mg via ORAL
  Filled 2017-09-02 (×2): qty 2

## 2017-09-02 MED ORDER — HYDRALAZINE HCL 20 MG/ML IJ SOLN: 10.0000 mg | INTRAMUSCULAR | Status: DC | PRN

## 2017-09-02 MED ORDER — ACETAMINOPHEN 650 MG RE SUPP: 650.0000 mg | Freq: Four times a day (QID) | RECTAL | Status: DC | PRN

## 2017-09-02 MED ORDER — SODIUM CHLORIDE 0.9 % IV BOLUS: 1000.0000 mL | Freq: Once | INTRAVENOUS | Status: DC

## 2017-09-02 MED ORDER — SODIUM CHLORIDE 0.9% FLUSH
3.0000 mL | Freq: Two times a day (BID) | INTRAVENOUS | Status: DC
  Administered 2017-09-03 (×2): 3 mL via INTRAVENOUS

## 2017-09-02 MED ORDER — INSULIN ASPART 100 UNIT/ML ~~LOC~~ SOLN
1.0000 [IU] | Freq: Three times a day (TID) | SUBCUTANEOUS | Status: DC
Start: 2017-09-03 — End: 2017-09-03

## 2017-09-02 MED ORDER — NYSTATIN 100000 UNIT/GM EX POWD
Freq: Two times a day (BID) | CUTANEOUS | Status: DC
  Administered 2017-09-03 – 2017-09-04 (×4): via TOPICAL
  Filled 2017-09-02 (×2): qty 15

## 2017-09-02 MED ORDER — INSULIN ASPART 100 UNIT/ML ~~LOC~~ SOLN
0.0000 [IU] | Freq: Three times a day (TID) | SUBCUTANEOUS | Status: DC
  Administered 2017-09-03: 7 [IU] via SUBCUTANEOUS
  Administered 2017-09-03: 4 [IU] via SUBCUTANEOUS
  Administered 2017-09-04: 3 [IU] via SUBCUTANEOUS
  Administered 2017-09-04: 7 [IU] via SUBCUTANEOUS
  Administered 2017-09-04: 4 [IU] via SUBCUTANEOUS

## 2017-09-02 MED ORDER — PANTOPRAZOLE SODIUM 40 MG PO TBEC
40.0000 mg | DELAYED_RELEASE_TABLET | Freq: Every day | ORAL | Status: DC
  Administered 2017-09-03 – 2017-09-04 (×2): 40 mg via ORAL
  Filled 2017-09-02 (×2): qty 1

## 2017-09-02 MED ORDER — OMEGA-3-ACID ETHYL ESTERS 1 G PO CAPS
1.0000 g | ORAL_CAPSULE | Freq: Every day | ORAL | Status: DC
  Administered 2017-09-03 – 2017-09-04 (×2): 1 g via ORAL
  Filled 2017-09-02 (×2): qty 1

## 2017-09-02 MED ORDER — SODIUM CHLORIDE 0.9 % IV SOLN: 250.0000 mL | INTRAVENOUS | Status: DC | PRN

## 2017-09-02 MED ORDER — LIRAGLUTIDE 18 MG/3ML ~~LOC~~ SOPN: 1.2000 mg | PEN_INJECTOR | Freq: Every day | SUBCUTANEOUS | Status: DC

## 2017-09-02 MED ORDER — IPRATROPIUM-ALBUTEROL 0.5-2.5 (3) MG/3ML IN SOLN: 3.0000 mL | Freq: Four times a day (QID) | RESPIRATORY_TRACT | Status: DC | PRN

## 2017-09-02 MED ORDER — CLOPIDOGREL BISULFATE 75 MG PO TABS
75.0000 mg | ORAL_TABLET | Freq: Every day | ORAL | Status: DC
  Administered 2017-09-03 – 2017-09-04 (×2): 75 mg via ORAL
  Filled 2017-09-02 (×2): qty 1

## 2017-09-02 MED ORDER — ACETAMINOPHEN 325 MG PO TABS: 650.0000 mg | ORAL_TABLET | Freq: Four times a day (QID) | ORAL | Status: DC | PRN

## 2017-09-02 MED ORDER — INSULIN DETEMIR 100 UNIT/ML ~~LOC~~ SOLN
52.0000 [IU] | Freq: Every day | SUBCUTANEOUS | Status: DC
  Administered 2017-09-03 (×2): 52 [IU] via SUBCUTANEOUS
  Filled 2017-09-02 (×5): qty 0.52

## 2017-09-02 MED ORDER — SODIUM CHLORIDE 0.9% FLUSH: 3.0000 mL | INTRAVENOUS | Status: DC | PRN

## 2017-09-02 MED ORDER — POLYETHYLENE GLYCOL 3350 17 G PO PACK: 17.0000 g | PACK | Freq: Every day | ORAL | Status: DC | PRN

## 2017-09-02 MED ORDER — ONDANSETRON HCL 4 MG PO TABS: 4.0000 mg | ORAL_TABLET | Freq: Four times a day (QID) | ORAL | Status: DC | PRN

## 2017-09-02 MED ORDER — INSULIN ASPART 100 UNIT/ML ~~LOC~~ SOLN
0.0000 [IU] | Freq: Every day | SUBCUTANEOUS | Status: DC
  Administered 2017-09-03: 2 [IU] via SUBCUTANEOUS

## 2017-09-02 MED ORDER — TOPIRAMATE 25 MG PO TABS
75.0000 mg | ORAL_TABLET | Freq: Every day | ORAL | Status: DC
  Administered 2017-09-03 – 2017-09-04 (×2): 75 mg via ORAL
  Filled 2017-09-02 (×2): qty 3

## 2017-09-02 MED ORDER — ENOXAPARIN SODIUM 60 MG/0.6ML ~~LOC~~ SOLN
0.5000 mg/kg | SUBCUTANEOUS | Status: DC
  Administered 2017-09-03 (×2): 55 mg via SUBCUTANEOUS
  Filled 2017-09-02 (×2): qty 0.6

## 2017-09-02 MED ORDER — FUROSEMIDE 40 MG PO TABS: 40.0000 mg | ORAL_TABLET | Freq: Every day | ORAL | Status: DC

## 2017-09-02 MED ORDER — ISOSORBIDE MONONITRATE ER 30 MG PO TB24
30.0000 mg | ORAL_TABLET | Freq: Every day | ORAL | Status: DC
  Administered 2017-09-03: 30 mg via ORAL
  Filled 2017-09-02: qty 1

## 2017-09-02 MED ORDER — VITAMIN D 1000 UNITS PO TABS
5000.0000 [IU] | ORAL_TABLET | Freq: Every day | ORAL | Status: DC
  Administered 2017-09-03 – 2017-09-04 (×2): 5000 [IU] via ORAL
  Filled 2017-09-02 (×2): qty 5

## 2017-09-02 MED ORDER — GABAPENTIN 300 MG PO CAPS
300.0000 mg | ORAL_CAPSULE | Freq: Three times a day (TID) | ORAL | Status: DC
  Administered 2017-09-03 – 2017-09-04 (×6): 300 mg via ORAL
  Filled 2017-09-02 (×6): qty 1

## 2017-09-02 MED ORDER — SILVER SULFADIAZINE 1 % EX CREA
1.0000 "application " | TOPICAL_CREAM | Freq: Every day | CUTANEOUS | Status: DC
  Administered 2017-09-03 – 2017-09-04 (×2): 1 via TOPICAL
  Filled 2017-09-02: qty 85

## 2017-09-02 MED ORDER — ONDANSETRON HCL 4 MG/2ML IJ SOLN: 4.0000 mg | Freq: Four times a day (QID) | INTRAMUSCULAR | Status: DC | PRN

## 2017-09-02 MED ORDER — FERROUS SULFATE 325 (65 FE) MG PO TABS
325.0000 mg | ORAL_TABLET | Freq: Every day | ORAL | Status: DC
  Administered 2017-09-03 – 2017-09-04 (×2): 325 mg via ORAL
  Filled 2017-09-02 (×2): qty 1

## 2017-09-02 MED ORDER — TRIAMCINOLONE ACETONIDE 0.1 % EX CREA
1.0000 "application " | TOPICAL_CREAM | Freq: Four times a day (QID) | CUTANEOUS | Status: DC
  Administered 2017-09-03 – 2017-09-04 (×6): 1 via TOPICAL
  Filled 2017-09-02 (×2): qty 15

## 2017-09-02 MED ORDER — UMECLIDINIUM-VILANTEROL 62.5-25 MCG/INH IN AEPB
1.0000 | INHALATION_SPRAY | Freq: Every day | RESPIRATORY_TRACT | Status: DC
  Administered 2017-09-03 – 2017-09-04 (×2): 1 via RESPIRATORY_TRACT
  Filled 2017-09-02: qty 14

## 2017-09-02 NOTE — ED Triage Notes (Signed)
Called out for Stephanie Combs. Pt a/o upon ems arrival and was bradycardic of 56, and bs 247. Pt c/o boil on buttocks. Pt arrived a/o. Pale . Pt states lost 80 lbs in last 4 months due to no appetite.

## 2017-09-02 NOTE — ED Notes (Addendum)
Rt called for [facility]. Pt aware needing urine sample

## 2017-09-02 NOTE — H&P (Addendum)
History and Physical    Stephanie Combs XIP:[id number] DOB: 1951/03/25 DOA: 09/02/2017  PCP: [name], MD   Patient coming from: Home  Chief Complaint: Weakness with syncopal episode  HPI: Stephanie Combs is a 67 y.o. female with medical history significant for CAD, chronic diastolic CHF grade 1 on echo 05/2017, hypertension, dyslipidemia, CKD stage III, type 2 diabetes, hypothyroidism, anemia, morbid obesity, and COPD who presents to the emergency department via EMS after a syncopal episode earlier this morning.  She has a difficult time describing exactly what happened and is a poor historian but states that she was instant messaging some friends on the computer and was sitting at the time and the next thing she knew she was on the floor.  She states that there was a period of time where she appears to have lost consciousness and called her neighbor and then 911.  She states that she last took her pain medications at 8 PM last night and has not taken her anxiety medication this morning. She denies any chest pain, palpitations, dyspnea, nausea, vomiting, or diaphoresis. She does report that recently she has had a very poor appetite and has lost approximately 80 pounds of weight over the last several months.   ED Course: Vital signs are noted to be stable, but she is bradycardic with heart rate in the 50 to 60 bpm range with sinus rhythm.  She was initially noted to be drowsy on conversation with the ED physician, but is now more awake and alert.  Laboratory data is unremarkable aside from glucose of 233, and TSH of 0.052.  Two-view chest x-ray with no acute findings and CT head with no acute findings and some noted atrophy.  EKG was sinus rhythm at 53 bpm with low voltage.  Review of Systems: All others reviewed and otherwise negative.  Past Medical History:  Diagnosis Date  . Anxiety   . CAD (coronary artery disease)   . CHF (congestive heart failure) (HCC)    Diastolic  . Chronic back  pain   . COPD (chronic obstructive pulmonary disease) ([place])   . Degenerative disk disease   . Diabetes mellitus without complication ([place])   . Hypertension   . Hypothyroidism   . On home O2    2.5 L N/C prn  . Pedal edema     Past Surgical History:  Procedure Laterality Date  . CORONARY STENT INTERVENTION N/A 05/22/2017   Procedure: CORONARY STENT INTERVENTION;  Surgeon: Martinique, Peter M, MD;  Location: [facility];  Service: Cardiovascular;  Laterality: N/A;  . HERNIA REPAIR    . RIGHT/LEFT HEART CATH AND CORONARY ANGIOGRAPHY N/A 05/19/2017   Procedure: RIGHT/LEFT HEART CATH AND CORONARY ANGIOGRAPHY;  Surgeon: Leonie Man, MD;  Location: [facility];  Service: Cardiovascular;  Laterality: N/A;  . TUBAL LIGATION       reports that she quit smoking about 2 years ago. Her smoking use included cigarettes. She smoked 0.25 packs per day. She has never used smokeless tobacco. She reports that she does not drink alcohol or use drugs.  Allergies  Allergen Reactions  . Dilaudid [Hydromorphone Hcl] Itching  . Actifed Cold-Allergy [Chlorpheniramine-Phenylephrine]     "I was sick and red and it didn't agree with me at all"  . Doxycycline Nausea And Vomiting  . Other Cough    Pt states she is allergic to ragweed and that she starts coughing and sneezing like crazy  . Penicillins Hives and Itching  Tolerates Rocephin Has patient had a PCN reaction causing immediate rash, facial/tongue/throat swelling, SOB or lightheadedness with hypotension: Yes Has patient had a PCN reaction causing severe rash involving mucus membranes or skin necrosis: No Has patient had a PCN reaction that required hospitalization Yes Has patient had a PCN reaction occurring within the last 10 years: No If all of the above answers are "NO", then may proceed with Cephalosporin use.   . Reglan [Metoclopramide] Itching  . Valium [Diazepam] Itching  . Vistaril [Hydroxyzine Hcl] Itching    Family History   Problem Relation Age of Onset  . Stroke Mother   . Heart attack Father   . Diabetes Brother   . Cerebral palsy Brother   . Pneumonia Brother   . Diabetes Other   . Heart attack Other     Prior to Admission medications   Medication Sig Start Date End Date Taking? Authorizing Provider  albuterol (PROAIR HFA) 108 (90 BASE) MCG/ACT inhaler Inhale 1 puff into the lungs every 4 (four) hours as needed. For shortness of breath Patient taking differently: Inhale 2 puffs into the lungs every 4 (four) hours as needed. For shortness of breath 05/30/13  Yes Buriev, Arie Sabina, MD  ALPRAZolam Duanne Moron) 1 MG tablet Take 1 mg by mouth 3 (three) times daily as needed for anxiety or sleep.  01/05/17  Yes [provider]  aspirin EC 81 MG tablet Take 81 mg by mouth daily.   Yes [provider]  atorvastatin (LIPITOR) 80 MG tablet Take 1 tablet (80 mg total) by mouth daily at 6 PM. 05/26/17  Yes [name], MD  Cholecalciferol (VITAMIN D3) 5000 units CAPS Take 1 capsule (5,000 Units total) by mouth daily. 08/19/16  Yes Cassandria Anger, MD  clopidogrel (PLAVIX) 75 MG tablet Take 1 tablet (75 mg total) by mouth daily. 05/26/17  Yes [name], MD  ferrous sulfate 325 (65 FE) MG tablet Take 1 tablet (325 mg total) by mouth daily with breakfast. 02/05/17  Yes [name], MD  furosemide (LASIX) 40 MG tablet Take 1 tablet (40 mg total) by mouth daily. 05/26/17  Yes [name], MD  gabapentin (NEURONTIN) 300 MG capsule Take 1 capsule (300 mg total) by mouth 3 (three) times daily. 02/05/17  Yes [name], MD  insulin aspart (NOVOLOG) 100 UNIT/ML injection Inject 0-9 Units into the skin 3 (three) times daily with meals. Patient taking differently: Inject 1-18 Units into the skin 3 (three) times daily with meals. Per sliding scale 05/26/17  Yes [name], MD  insulin detemir (LEVEMIR) 100 UNIT/ML injection Inject 0.52 mLs (52 Units total) into the skin at bedtime. 05/26/17  Yes [name], MD  isosorbide  mononitrate (IMDUR) 30 MG 24 hr tablet Take 1 tablet (30 mg total) by mouth daily. Patient taking differently: Take 60 mg by mouth daily.  07/24/17 10/22/17 Yes Herminio Commons, MD  levothyroxine (SYNTHROID, LEVOTHROID) 112 MCG tablet Take 224 mcg by mouth daily before breakfast.  01/21/17  Yes [provider]  metoprolol tartrate (LOPRESSOR) 50 MG tablet TAKE (1) TABLET TWICE DAILY. Patient taking differently: TAKE (1) TABLET BY MOUTH DAILY 08/25/17  Yes Herminio Commons, MD  nystatin (MYCOSTATIN/NYSTOP) powder Apply topically 2 (two) times daily. 10/10/16  Yes Eber Jones, MD  Omega-3 Fatty Acids (FISH OIL PO) Take 1 capsule by mouth daily.    Yes [provider]  ondansetron (ZOFRAN) 4 MG tablet Take 4 mg by mouth every 8 (eight) hours as needed for  vomiting.  01/01/17  Yes [provider]  oxyCODONE-acetaminophen (PERCOCET) 10-325 MG tablet Take 1 tablet by mouth every 6 (six) hours as needed for pain.   Yes [provider]  pantoprazole (PROTONIX) 40 MG tablet Take 1 tablet (40 mg total) by mouth daily. 02/05/17  Yes [name], MD  polyethylene glycol [facility] / Floria Raveling) packet Take 17 g by mouth daily as needed for mild constipation or moderate constipation. 05/26/17  Yes [name], MD  silver sulfADIAZINE (SILVADENE) 1 % cream Apply 1 application topically daily.   Yes [provider]  tiZANidine (ZANAFLEX) 4 MG tablet Take 4 mg by mouth every 8 (eight) hours as needed for muscle spasms.  01/10/17  Yes [provider]  topiramate (TOPAMAX) 25 MG tablet Take 75 mg by mouth daily.  01/02/17  Yes [provider]  triamcinolone cream (KENALOG) 0.1 % Apply 1 application topically 4 (four) times daily.  01/24/17  Yes [provider]  umeclidinium-vilanterol (ANORO ELLIPTA) 62.5-25 MCG/INH AEPB Inhale 1 puff into the lungs daily. 05/26/17  Yes [name], MD  VICTOZA 18 MG/3ML SOPN Inject 1.2 mg into the skin daily.   01/01/17  Yes [provider]  Hydrocortisone (GERHARDT'S BUTT CREAM) CREA Apply 1 application topically 3 (three) times daily. 05/26/17   [name], MD  SURE COMFORT INS SYR 1CC/28G 28G X 1/2" 1 ML MISC USE TO INJECT INSULIN UP TO 4 TIMES DAILY. 03/06/17   Cassandria Anger, MD    Physical Exam: Vitals:   09/02/17 1949 09/02/17 2030 09/02/17 2100 09/02/17 2130  BP:  (!) 132/49 (!) 137/42 (!) 143/51  Pulse:  (!) 55 (!) 54 (!) 55  Resp:  15 19 15   Temp:      TempSrc:      SpO2:  96% 97% 97%  Weight: 107.5 kg (237 lb)     Height: 5' 6.5" (1.689 m)       Constitutional: NAD, calm, comfortable; obese Vitals:   09/02/17 1949 09/02/17 2030 09/02/17 2100 09/02/17 2130  BP:  (!) 132/49 (!) 137/42 (!) 143/51  Pulse:  (!) 55 (!) 54 (!) 55  Resp:  15 19 15   Temp:      TempSrc:      SpO2:  96% 97% 97%  Weight: 107.5 kg (237 lb)     Height: 5' 6.5" (1.689 m)      Eyes: lids and conjunctivae normal ENMT: Mucous membranes are moist.  Neck: normal, supple Respiratory: clear to auscultation bilaterally. Normal respiratory effort. No accessory muscle use. On RA. Cardiovascular: Bradycardic, regular rate and rhythm, no murmurs. Extremity edema present. Abdomen: no tenderness, no distention. Bowel sounds positive. Abd pannus. Musculoskeletal:  No joint deformity upper and lower extremities.   Skin: no rashes, lesions, ulcers. Skin breakdown with no abscess on buttocks.  Labs on Admission: I have personally reviewed following labs and imaging studies  CBC: Recent Labs  Lab 09/02/17 1845  WBC 7.3  NEUTROABS 4.3  HGB 10.6*  HCT 33.4*  MCV 88.4  PLT 916   Basic Metabolic Panel: Recent Labs  Lab 09/02/17 1845  NA 135  K 4.2  CL 101  CO2 25  GLUCOSE 233*  BUN 24*  CREATININE 1.45*  CALCIUM 8.9   GFR: Estimated Creatinine Clearance: 47.1 mL/min (A) (by C-G formula based on SCr of 1.45 mg/dL (H)). Liver Function Tests: Recent Labs  Lab 09/02/17 1845  AST 17    ALT 12*  ALKPHOS 91  BILITOT 0.7  PROT 6.4*  ALBUMIN 3.5   No results for input(s): LIPASE, AMYLASE in the last 168 hours. Recent Labs  Lab 09/02/17 1946  AMMONIA 10   Coagulation Profile: No results for input(s): INR, PROTIME in the last 168 hours. Cardiac Enzymes: No results for input(s): CKTOTAL, CKMB, CKMBINDEX, TROPONINI in the last 168 hours. BNP (last 3 results) No results for input(s): PROBNP in the last 8760 hours. HbA1C: No results for input(s): HGBA1C in the last 72 hours. CBG: No results for input(s): GLUCAP in the last 168 hours. Lipid Profile: No results for input(s): CHOL, HDL, LDLCALC, TRIG, CHOLHDL, LDLDIRECT in the last 72 hours. Thyroid Function Tests: Recent Labs    09/02/17 1846  TSH 0.052*   Anemia Panel: No results for input(s): VITAMINB12, FOLATE, FERRITIN, TIBC, IRON, RETICCTPCT in the last 72 hours. Urine analysis:    Component Value Date/Time   COLORURINE YELLOW 09/02/2017 1821   APPEARANCEUR CLEAR 09/02/2017 1821   LABSPEC 1.013 09/02/2017 1821   PHURINE 5.0 09/02/2017 1821   GLUCOSEU NEGATIVE 09/02/2017 1821   HGBUR SMALL (A) 09/02/2017 1821   BILIRUBINUR NEGATIVE 09/02/2017 1821   KETONESUR NEGATIVE 09/02/2017 1821   PROTEINUR NEGATIVE 09/02/2017 1821   UROBILINOGEN 0.2 12/04/2014 0650   NITRITE NEGATIVE 09/02/2017 1821   LEUKOCYTESUR NEGATIVE 09/02/2017 1821    Radiological Exams on Admission: Dg Chest 2 View  Result Date: 09/02/2017 CLINICAL DATA:  Altered mental status. EXAM: CHEST - 2 VIEW COMPARISON:  Single-view of the chest 05/15/2017. CT chest 01/27/2017. FINDINGS: Lungs clear. Heart size normal. No pneumothorax or pleural fluid. No acute bony abnormality. IMPRESSION: No acute disease. Electronically Signed   By: Inge Rise M.D.   On: 09/02/2017 19:49   Ct Head Wo Contrast  Result Date: 09/02/2017 CLINICAL DATA:  Altered level of consciousness. EXAM: CT HEAD WITHOUT CONTRAST TECHNIQUE: Contiguous axial images were  obtained from the base of the skull through the vertex without intravenous contrast. COMPARISON:  12/06/2015 FINDINGS: Brain: There is atrophy and chronic small vessel disease changes. No acute intracranial abnormality. Specifically, no hemorrhage, hydrocephalus, mass lesion, acute infarction, or significant intracranial injury. Vascular: No hyperdense vessel or unexpected calcification. Skull: No acute calvarial abnormality.  Extensive hyperostosis. Sinuses/Orbits: Visualized paranasal sinuses and mastoids clear. Orbital soft tissues unremarkable. Other: None IMPRESSION: No acute intracranial abnormality. Atrophy, chronic microvascular disease. Electronically Signed   By: Rolm Baptise M.D.   On: 09/02/2017 19:49    EKG: Independently reviewed.  Sinus rhythm at 53 bpm with low voltage.  Assessment/Plan Principal Problem:   Syncope and collapse Active Problems:   COPD (chronic obstructive pulmonary disease) (HCC)   Morbid obesity (HCC)   DM type 2 (diabetes mellitus, type 2) (HCC)   HTN (hypertension), benign   CKD (chronic kidney disease), stage III (HCC)   Chronic diastolic CHF (congestive heart failure) (HCC)   Hypothyroidism   Anemia in CKD (chronic kidney disease)   Edema of lower extremity   Skin ulcer ([place])    1. Generalized weakness with syncope.  Patient appears to have had poor oral intake and general poor appetite, but does not appear to be volume depleted and therefore, I will hold on IV fluid for now.  I will check orthostatics in a.m. as well as carotid ultrasounds.  We will not cycle troponins as she has not had any chest pain.  Recent echo on 05/2017 which I will not currently repeat.  I believe much of this may be related to her high doses of Synthroid creating  a hyperthyroid state along with polypharmacy. 2. Sinus bradycardia.  Hold metoprolol and monitor on telemetry. 3. History of hypothyroidism now with decreased TSH.  Hold Synthroid and check T3 and T4 levels.  This may be  responsible for her weight loss and decreased appetite. 4. CAD. Maintain on ASA and Plavix. 5. DM type 2 with hyperglycemia. Maintain on home medications with insulin. SSI for additional coverage and Carb modified diet. 6. Hypertension.  Controlled metoprolol with hydralazine pushes as needed. 7. CKD stage III.  Appears to be at baseline.  Monitor I's and O's and daily weights with IV fluid.  Recheck labs in a.m. 8. Anemia in CKD.  Currently stable.  Continue to monitor.  No signs of bleeding noted. 9. COPD.  No acute bronchospasms noted.  Will add duo nebs as needed 10. Possible developing pressure sore.  Wound care evaluation.  Patient does have home health services who assist with this.   DVT prophylaxis: Lovenox Code Status: Full Family Communication: None at bedside Disposition Plan: Observation on telemetry with some syncope work-up.  If feeling better in a.m. can likely DC to home. Consults called:None Admission status: Observation, telemetry   Pratik Darleen Crocker DO [facility] Pager [phone]  If 7PM-7AM, please contact night-coverage [email or web address] Password [facility]  09/02/2017, 10:53 PM

## 2017-09-02 NOTE — ED Notes (Signed)
Pt was talking upon arrival and awake. Pt now falling asleep during conversation, arouses easily with calling name. Mumbling at times.

## 2017-09-02 NOTE — ED Notes (Signed)
Patient given decaf coffee at this time.

## 2017-09-02 NOTE — ED Provider Notes (Signed)
Emergency Department Provider Note   I have reviewed the triage vital signs and the nursing notes.   HISTORY  Chief Complaint Bradycardia and Weakness   HPI Stephanie Combs is a 67 y.o. female with PMH of CAD, CHF, COPD, elevated BMI, DM, HTN, and Hypothyroidism presents to the emergency department for evaluation by EMS of generalized weakness.  Patient states that she had a syncope event this morning but has difficulty describing exactly what happened.  She denies any chest pain or difficulty breathing currently.  She has been having pain in her gluteal area and is concerned about possibly developing a "boil."  Patient denies any drug or alcohol use.  She states she last took her pain medications at 8 PM last night.   Level 5 caveat: Patient is very drowsy and dozing off during conversation.   Past Medical History:  Diagnosis Date  . Anxiety   . CAD (coronary artery disease)   . CHF (congestive heart failure) (HCC)    Diastolic  . Chronic back pain   . COPD (chronic obstructive pulmonary disease) ([place])   . Degenerative disk disease   . Diabetes mellitus without complication ([place])   . Hypertension   . Hypothyroidism   . On home O2    2.5 L N/C prn  . Pedal edema     Patient Active Problem List   Diagnosis Date Noted  . Syncope and collapse 09/02/2017  . Syncope 09/02/2017  . Abnormal nuclear stress test 05/19/2017  . Atypical angina ([place]) 05/19/2017  . Edema 05/15/2017  . Skin ulcer ([place]) 01/30/2017  . Tinea cruris 10/10/2016  . Vitamin D deficiency 08/19/2016  . Personal history of noncompliance with medical treatment, presenting hazards to health 06/17/2016  . Left hand weakness 12/06/2015  . CAP (community acquired pneumonia) 07/25/2015  . Pressure ulcer 05/02/2015  . Hyponatremia 05/01/2015  . Acute-on-chronic kidney injury ([place]) 05/01/2015  . Uncontrolled type 2 diabetes mellitus with stage 3 chronic kidney disease ([place]) 05/01/2015  . COPD exacerbation  ([place]) 04/30/2015  . Cellulitis of foot, right 03/24/2015  . Elevated d-dimer 03/24/2015  . Peripheral edema 12/04/2014  . Bilateral edema of lower extremity   . Diabetic ulcer of right great toe ([place])   . Foot ulcer due to secondary DM ([place]) 11/07/2014  . Diabetic ulcer of right foot ([place]) 11/07/2014  . Edema of lower extremity 05/27/2013  . Headache 05/26/2013  . CKD (chronic kidney disease), stage III ([place]) 04/14/2013  . Leukocytosis 04/14/2013  . Anemia in CKD (chronic kidney disease) 04/14/2013  . Dyspnea 04/14/2013  . Chronic diastolic CHF (congestive heart failure) ([place])   . Hypothyroidism   . COPD (chronic obstructive pulmonary disease) ([place]) 08/07/2012  . Morbid obesity ([place]) 08/07/2012  . DM type 2 (diabetes mellitus, type 2) ([place]) 08/07/2012  . HTN (hypertension), benign 08/07/2012  . Hypothyroidism (acquired) 08/07/2012    Past Surgical History:  Procedure Laterality Date  . CORONARY STENT INTERVENTION N/A 05/22/2017   Procedure: CORONARY STENT INTERVENTION;  Surgeon: Martinique, Peter M, MD;  Location: [facility];  Service: Cardiovascular;  Laterality: N/A;  . HERNIA REPAIR    . RIGHT/LEFT HEART CATH AND CORONARY ANGIOGRAPHY N/A 05/19/2017   Procedure: RIGHT/LEFT HEART CATH AND CORONARY ANGIOGRAPHY;  Surgeon: Leonie Man, MD;  Location: [facility];  Service: Cardiovascular;  Laterality: N/A;  . TUBAL LIGATION        Allergies Dilaudid [hydromorphone hcl]; Actifed cold-allergy [chlorpheniramine-phenylephrine]; Doxycycline; Other; Penicillins; Reglan [metoclopramide]; Valium [  diazepam]; and Vistaril [hydroxyzine hcl]  Family History  Problem Relation Age of Onset  . Stroke Mother   . Heart attack Father   . Diabetes Brother   . Cerebral palsy Brother   . Pneumonia Brother   . Diabetes Other   . Heart attack Other     Social History Social History   Tobacco Use  . Smoking status: Former Smoker    Packs/day: 0.25    Types: Cigarettes     Last attempt to quit: 06/27/2015    Years since quitting: 2.1  . Smokeless tobacco: Never Used  Substance Use Topics  . Alcohol use: No    Alcohol/week: 0.0 oz  . Drug use: No    Review of Systems  Level 5 caveat: Somnolence  ____________________________________________   PHYSICAL EXAM:  VITAL SIGNS: ED Triage Vitals  Enc Vitals Group     BP 09/02/17 1811 (!) 101/56     Pulse Rate 09/02/17 1811 (!) 52     Resp 09/02/17 1811 19     Temp 09/02/17 1811 (!) 97.5 F (36.4 C)     Temp Source 09/02/17 1811 Oral     SpO2 09/02/17 1811 97 %     Pain Score 09/02/17 1808 10   Constitutional: Somnolent with voice trailing off during interview. Waking with loud voice or gentle nudge to the shoulder. No acute distress.  Eyes: Conjunctivae are normal.  Head: Atraumatic. Nose: No congestion/rhinnorhea. Mouth/Throat: Mucous membranes are very dry.  Neck: No stridor.   Cardiovascular: Sinus bradycardia. Good peripheral circulation. Grossly normal heart sounds.   Respiratory: Normal respiratory effort.  No retractions. Lungs CTAB. Gastrointestinal: Soft and nontender. No distention. Obese abdomen.  Musculoskeletal: No lower extremity tenderness nor edema. No gross deformities of extremities. Neurologic:  Somnolent as above but awakens to provide short history with frequent prompting. No gross focal neurologic deficits are appreciated.  Skin:  Skin is warm, dry and intact. Erythema over the sacrum with no fluctuance or induration. Area of skin breakdown noted most consistent with developing pressure sore. No abscess appreciated.   ____________________________________________   LABS (all labs ordered are listed, but only abnormal results are displayed)  Labs Reviewed  COMPREHENSIVE METABOLIC PANEL - Abnormal; Notable for the following components:      Result Value   Glucose, Bld 233 (*)    BUN 24 (*)    Creatinine, Ser 1.45 (*)    Total Protein 6.4 (*)    ALT 12 (*)    GFR calc non  Af Amer 36 (*)    GFR calc Af Amer 42 (*)    All other components within normal limits  CBC WITH DIFFERENTIAL/PLATELET - Abnormal; Notable for the following components:   RBC 3.78 (*)    Hemoglobin 10.6 (*)    HCT 33.4 (*)    All other components within normal limits  URINALYSIS, ROUTINE W REFLEX MICROSCOPIC - Abnormal; Notable for the following components:   Hgb urine dipstick SMALL (*)    Bacteria, UA RARE (*)    All other components within normal limits  RAPID URINE DRUG SCREEN, HOSP PERFORMED - Abnormal; Notable for the following components:   Benzodiazepines POSITIVE (*)    All other components within normal limits  TSH - Abnormal; Notable for the following components:   TSH 0.052 (*)    All other components within normal limits  GLUCOSE, CAPILLARY - Abnormal; Notable for the following components:   Glucose-Capillary 180 (*)    All other components within  normal limits  URINE CULTURE  ETHANOL  BLOOD GAS, VENOUS  AMMONIA  T4, FREE  T3, FREE  BASIC METABOLIC PANEL  CBC  HEMOGLOBIN A1C  I-STAT TROPONIN, ED  I-STAT CG4 LACTIC ACID, ED   ____________________________________________  EKG   EKG Interpretation  Date/Time:  Wednesday September 02 2017 18:17:14 EDT Ventricular Rate:  53 PR Interval:    QRS Duration: 104 QT Interval:  506 QTC Calculation: 476 R Axis:   82 Text Interpretation:  Sinus rhythm Borderline right axis deviation Low voltage, precordial leads No STEMI.  Confirmed by Nanda Quinton [phone]) on 09/02/2017 6:22:57 PM       ____________________________________________  RADIOLOGY  Dg Chest 2 View  Result Date: 09/02/2017 CLINICAL DATA:  Altered mental status. EXAM: CHEST - 2 VIEW COMPARISON:  Single-view of the chest 05/15/2017. CT chest 01/27/2017. FINDINGS: Lungs clear. Heart size normal. No pneumothorax or pleural fluid. No acute bony abnormality. IMPRESSION: No acute disease. Electronically Signed   By: Inge Rise M.D.   On: 09/02/2017 19:49    Ct Head Wo Contrast  Result Date: 09/02/2017 CLINICAL DATA:  Altered level of consciousness. EXAM: CT HEAD WITHOUT CONTRAST TECHNIQUE: Contiguous axial images were obtained from the base of the skull through the vertex without intravenous contrast. COMPARISON:  12/06/2015 FINDINGS: Brain: There is atrophy and chronic small vessel disease changes. No acute intracranial abnormality. Specifically, no hemorrhage, hydrocephalus, mass lesion, acute infarction, or significant intracranial injury. Vascular: No hyperdense vessel or unexpected calcification. Skull: No acute calvarial abnormality.  Extensive hyperostosis. Sinuses/Orbits: Visualized paranasal sinuses and mastoids clear. Orbital soft tissues unremarkable. Other: None IMPRESSION: No acute intracranial abnormality. Atrophy, chronic microvascular disease. Electronically Signed   By: Rolm Baptise M.D.   On: 09/02/2017 19:49    ____________________________________________   PROCEDURES  Procedure(s) performed:   .Critical Care Performed by: Margette Fast, MD Authorized by: Margette Fast, MD   Critical care provider statement:    Critical care time (minutes):  35   Critical care time was exclusive of:  Separately billable procedures and treating other patients and teaching time   Critical care was necessary to treat or prevent imminent or life-threatening deterioration of the following conditions:  CNS failure or compromise, cardiac failure and dehydration   Critical care was time spent personally by me on the following activities:  Blood draw for specimens, development of treatment plan with patient or surrogate, evaluation of patient's response to treatment, examination of patient, obtaining history from patient or surrogate, ordering and performing treatments and interventions, ordering and review of radiographic studies, ordering and review of laboratory studies, pulse oximetry, re-evaluation of patient's condition and review of old  charts   I assumed direction of critical care for this patient from another provider in my specialty: no      ____________________________________________   INITIAL IMPRESSION / ASSESSMENT AND PLAN / ED COURSE  Pertinent labs & imaging results that were available during my care of the patient were reviewed by me and considered in my medical decision making (see chart for details).  Patient presents to the emergency department with generalized weakness, somnolence by EMS.  She has mild bradycardia with normal intervals.  She is afebrile here with mild hypotension.  She is able to provide brief segments of history but nothing terribly detailed.  She does endorse having a syncope event but denies any chest pain.  At this point my differential remains very broad and includes infectious etiology, thyroid disorder, head injury, polypharmacy, and/or symptomatic bradycardia.  No indication at this time for external pacing.   Patient mental status and vitals are improving. No fever. No clear labs abnormalities other than low TSH. Additional thyroid labs pending. Patient now able to provide more history which sounds concerns for syncope vs seizure. Plan for overnight obs. CT head and CXR reviewed with no acute findings.   Discussed patient's case with Hospitalist, Dr. Manuella Ghazi to request admission. Patient and family (if present) updated with plan. Care transferred to Hospitalist service.  I reviewed all nursing notes, vitals, pertinent old records, EKGs, labs, imaging (as available).  ____________________________________________  FINAL CLINICAL IMPRESSION(S) / ED DIAGNOSES  Final diagnoses:  Syncope, unspecified syncope type  Bradycardia  Acute encephalopathy     MEDICATIONS GIVEN DURING THIS VISIT:  Medications  aspirin EC tablet 81 mg (has no administration in time range)  atorvastatin (LIPITOR) tablet 80 mg (has no administration in time range)  cholecalciferol (VITAMIN D) tablet 5,000  Units (has no administration in time range)  clopidogrel (PLAVIX) tablet 75 mg (has no administration in time range)  ferrous sulfate tablet 325 mg (has no administration in time range)  furosemide (LASIX) tablet 40 mg (has no administration in time range)  gabapentin (NEURONTIN) capsule 300 mg (300 mg Oral Given 09/03/17 0053)  insulin detemir (LEVEMIR) injection 52 Units (has no administration in time range)  isosorbide mononitrate (IMDUR) 24 hr tablet 30 mg (has no administration in time range)  nystatin (MYCOSTATIN/NYSTOP) topical powder (has no administration in time range)  omega-3 acid ethyl esters (LOVAZA) capsule 1 g (has no administration in time range)  pantoprazole (PROTONIX) EC tablet 40 mg (has no administration in time range)  polyethylene glycol (MIRALAX / GLYCOLAX) packet 17 g (has no administration in time range)  silver sulfADIAZINE (SILVADENE) 1 % cream 1 application (has no administration in time range)  topiramate (TOPAMAX) tablet 75 mg (has no administration in time range)  triamcinolone cream (KENALOG) 0.1 % 1 application (has no administration in time range)  umeclidinium-vilanterol (ANORO ELLIPTA) 62.5-25 MCG/INH 1 puff (has no administration in time range)  liraglutide (VICTOZA) SOPN 1.2 mg (has no administration in time range)  enoxaparin (LOVENOX) injection 55 mg (55 mg Subcutaneous Given 09/03/17 0052)  sodium chloride flush (NS) 0.9 % injection 3 mL (3 mLs Intravenous Given 09/03/17 0054)  sodium chloride flush (NS) 0.9 % injection 3 mL (has no administration in time range)  0.9 %  sodium chloride infusion (has no administration in time range)  acetaminophen (TYLENOL) tablet 650 mg (has no administration in time range)    Or  acetaminophen (TYLENOL) suppository 650 mg (has no administration in time range)  ondansetron (ZOFRAN) tablet 4 mg (has no administration in time range)    Or  ondansetron (ZOFRAN) injection 4 mg (has no administration in time range)   ipratropium-albuterol (DUONEB) 0.5-2.5 (3) MG/3ML nebulizer solution 3 mL (has no administration in time range)  hydrALAZINE (APRESOLINE) injection 10 mg (has no administration in time range)  insulin aspart (novoLOG) injection 0-5 Units (0 Units Subcutaneous Not Given 09/03/17 0026)  insulin aspart (novoLOG) injection 0-20 Units (has no administration in time range)  sodium chloride 0.9 % bolus 500 mL (0 mLs Intravenous Stopped 09/02/17 1948)    Note:  This document was prepared using Dragon voice recognition software and may include unintentional dictation errors.  Nanda Quinton, MD Emergency Medicine    Hasel Janish, Wonda [facility], MD 09/03/17 [phone]

## 2017-09-03 ENCOUNTER — Observation Stay (HOSPITAL_COMMUNITY): Payer: Medicare Other

## 2017-09-03 DIAGNOSIS — I498 Other specified cardiac arrhythmias: Secondary | ICD-10-CM | POA: Diagnosis not present

## 2017-09-03 DIAGNOSIS — I951 Orthostatic hypotension: Secondary | ICD-10-CM | POA: Diagnosis not present

## 2017-09-03 DIAGNOSIS — E032 Hypothyroidism due to medicaments and other exogenous substances: Secondary | ICD-10-CM

## 2017-09-03 DIAGNOSIS — E0865 Diabetes mellitus due to underlying condition with hyperglycemia: Secondary | ICD-10-CM | POA: Diagnosis not present

## 2017-09-03 DIAGNOSIS — R55 Syncope and collapse: Secondary | ICD-10-CM | POA: Diagnosis not present

## 2017-09-03 DIAGNOSIS — I5032 Chronic diastolic (congestive) heart failure: Secondary | ICD-10-CM | POA: Diagnosis not present

## 2017-09-03 LAB — BASIC METABOLIC PANEL
Anion gap: 12 (ref 5–15)
BUN: 19 mg/dL (ref 6–20)
CO2: 25 mmol/L (ref 22–32)
Calcium: 8.8 mg/dL — ABNORMAL LOW (ref 8.9–10.3)
Chloride: 103 mmol/L (ref 101–111)
Creatinine, Ser: 1.2 mg/dL — ABNORMAL HIGH (ref 0.44–1.00)
GFR calc Af Amer: 53 mL/min — ABNORMAL LOW (ref >60)
GFR calc non Af Amer: 46 mL/min — ABNORMAL LOW (ref >60)
Glucose, Bld: 135 mg/dL — ABNORMAL HIGH (ref 65–99)
Potassium: 3.5 mmol/L (ref 3.5–5.1)
Sodium: 140 mmol/L (ref 135–145)

## 2017-09-03 LAB — HEMOGLOBIN A1C
Hgb A1c MFr Bld: 8.2 % — ABNORMAL HIGH (ref 4.8–5.6)
Mean Plasma Glucose: 188.64 mg/dL

## 2017-09-03 LAB — CBC
HCT: 33.3 % — ABNORMAL LOW (ref 36.0–46.0)
Hemoglobin: 10.4 g/dL — ABNORMAL LOW (ref 12.0–15.0)
MCH: 27.7 pg (ref 26.0–34.0)
MCHC: 31.2 g/dL (ref 30.0–36.0)
MCV: 88.8 fL (ref 78.0–100.0)
Platelets: 274 10*3/uL (ref 150–400)
RBC: 3.75 MIL/uL — ABNORMAL LOW (ref 3.87–5.11)
RDW: 14.2 % (ref 11.5–15.5)
WBC: 7 10*3/uL (ref 4.0–10.5)

## 2017-09-03 LAB — T4, FREE: Free T4: 2.06 ng/dL — ABNORMAL HIGH (ref 0.61–1.12)

## 2017-09-03 LAB — GLUCOSE, CAPILLARY
Glucose-Capillary: 116 mg/dL — ABNORMAL HIGH (ref 65–99)
Glucose-Capillary: 180 mg/dL — ABNORMAL HIGH (ref 65–99)
Glucose-Capillary: 189 mg/dL — ABNORMAL HIGH (ref 65–99)
Glucose-Capillary: 223 mg/dL — ABNORMAL HIGH (ref 65–99)
Glucose-Capillary: 227 mg/dL — ABNORMAL HIGH (ref 65–99)

## 2017-09-03 LAB — POCT I-STAT TROPONIN I: Troponin i, poc: 0 ng/mL (ref 0.00–0.08)

## 2017-09-03 LAB — MRSA PCR SCREENING: MRSA by PCR: POSITIVE — AB

## 2017-09-03 MED ORDER — SODIUM CHLORIDE 0.9 % IV SOLN
INTRAVENOUS | Status: AC
  Administered 2017-09-03 – 2017-09-04 (×2): via INTRAVENOUS

## 2017-09-03 MED ORDER — ALPRAZOLAM 1 MG PO TABS
1.0000 mg | ORAL_TABLET | Freq: Three times a day (TID) | ORAL | Status: DC | PRN
  Administered 2017-09-03 – 2017-09-04 (×3): 1 mg via ORAL
  Filled 2017-09-03 (×3): qty 1

## 2017-09-03 MED ORDER — CHLORHEXIDINE GLUCONATE CLOTH 2 % EX PADS
6.0000 | MEDICATED_PAD | Freq: Every day | CUTANEOUS | Status: DC
  Administered 2017-09-03 – 2017-09-04 (×2): 6 via TOPICAL

## 2017-09-03 MED ORDER — OXYCODONE-ACETAMINOPHEN 5-325 MG PO TABS
1.0000 | ORAL_TABLET | Freq: Four times a day (QID) | ORAL | Status: DC | PRN
  Administered 2017-09-03 – 2017-09-04 (×2): 1 via ORAL
  Filled 2017-09-03 (×2): qty 1

## 2017-09-03 MED ORDER — MUPIROCIN 2 % EX OINT
1.0000 "application " | TOPICAL_OINTMENT | Freq: Two times a day (BID) | CUTANEOUS | Status: DC
  Administered 2017-09-03 – 2017-09-04 (×3): 1 via NASAL
  Filled 2017-09-03: qty 22

## 2017-09-03 MED ORDER — OXYCODONE HCL 5 MG PO TABS
5.0000 mg | ORAL_TABLET | Freq: Four times a day (QID) | ORAL | Status: DC | PRN
  Administered 2017-09-03 – 2017-09-04 (×3): 5 mg via ORAL
  Filled 2017-09-03 (×3): qty 1

## 2017-09-03 MED ORDER — LEVOTHYROXINE SODIUM 25 MCG PO TABS
25.0000 ug | ORAL_TABLET | Freq: Every day | ORAL | Status: DC
  Administered 2017-09-04: 25 ug via ORAL
  Filled 2017-09-03: qty 1

## 2017-09-03 MED ORDER — TIZANIDINE HCL 4 MG PO TABS: 4.0000 mg | ORAL_TABLET | Freq: Three times a day (TID) | ORAL | Status: DC | PRN

## 2017-09-03 MED ORDER — OXYCODONE-ACETAMINOPHEN 10-325 MG PO TABS: 1.0000 | ORAL_TABLET | Freq: Four times a day (QID) | ORAL | Status: DC | PRN

## 2017-09-03 NOTE — Care Management Note (Signed)
Case Management Note  Patient Details  Name: Stephanie Combs MRN: [id number] Date of Birth: 31-Jul-1950  Subjective/Objective:   Adm with syncope. Active with [facility] home [facility] for nursing and aide services. Recommended to add PT services. Patient is agreeable. Will need new orders for RN, PT and aide services to resume care.  Has oxygen at home. Reports daughter or pastor will transport her home and both have tanks available for transport.                 Action/Plan: DC home with home health. Judson Roch of [facility] aware and will obtain orders when available.   Expected Discharge Date:    09/03/2017             Expected Discharge Plan:  [place]  In-House Referral:     Discharge planning Services  CM Consult  Post Acute Care Choice:  Home Health, Resumption of Svcs/PTA Provider Choice offered to:  Patient  DME Arranged:    DME Agency:     HH Arranged:  PT [place]:  [facility]  Status of Service:  Completed, signed off  If discussed at [place] of Stay Meetings, dates discussed:    Additional Comments:  Melvina Pangelinan, Chauncey [place], RN 09/03/2017, 1:36 PM

## 2017-09-03 NOTE — Plan of Care (Signed)
  Problem: Acute Rehab PT Goals(only PT should resolve) Goal: Pt Will Go Supine/Side To Sit Outcome: Progressing Flowsheets (Taken 09/03/2017 1228) Pt will go Supine/Side to Sit: with supervision Goal: Patient Will Transfer Sit To/From Stand Outcome: Progressing Flowsheets (Taken 09/03/2017 1228) Patient will transfer sit to/from stand: with supervision Goal: Pt Will Transfer Bed To Chair/Chair To Bed Outcome: Progressing Flowsheets (Taken 09/03/2017 1228) Pt will Transfer Bed to Chair/Chair to Bed: with supervision Goal: Pt Will Ambulate Outcome: Progressing Flowsheets (Taken 09/03/2017 1228) Pt will Ambulate: with modified independence;75 feet;with rolling walker  12:29 PM, 09/03/17 Lonell Grandchild, MPT Physical Therapist with [facility] [phone] office [phone] mobile phone

## 2017-09-03 NOTE — Care Management Obs Status (Signed)
[place] NOTIFICATION   Patient Details  Name: Stephanie Combs MRN: [id number] Date of Birth: 08-24-1950   Medicare Observation Status Notification Given:  Yes    Murry Diaz, Chauncey [place], RN 09/03/2017, 11:24 AM

## 2017-09-03 NOTE — Evaluation (Signed)
Physical Therapy Evaluation Patient Details Name: Stephanie Combs MRN: [id number] DOB: [birth year] Today's Date: 09/03/2017   History of Present Illness  Stephanie Combs is a 67 y.o. female with medical history significant for CAD, chronic diastolic CHF grade 1 on echo 05/2017, hypertension, dyslipidemia, CKD stage III, type 2 diabetes, hypothyroidism, anemia, morbid obesity, and COPD who presents to the emergency department via EMS after a syncopal episode earlier this morning.  She has a difficult time describing exactly what happened and is a poor historian but states that she was instant messaging some friends on the computer and was sitting at the time and the next thing she knew she was on the floor.  She states that there was a period of time where she appears to have lost consciousness and called her neighbor and then 911.  She states that she last took her pain medications at 8 PM last night and has not taken her anxiety medication this morning.    Clinical Impression  Patient demonstrates labored movement for sitting up at bedside requiring assistance to pull self up, able to stand with RW for up to 5 minutes while checking BP, tolerated walking in hallway without loss of balance, but limited secondary to c/o fatigue and chest pain while leaning on RW, mostly likely muscular and tolerated transferring to wheelchair to be taken to procedure by nursing staff.  Orthostatics: lying 151/52, sitting 162/60, standing 65/44, after transferring to [facility], then chair 153/77.  Patient c/o mild dizziness that resolved after activity.  Patient will benefit from continued physical therapy in hospital and recommended venue below to increase strength, balance, endurance for safe ADLs and gait.    Follow Up Recommendations Home health PT    Equipment Recommendations  None recommended by PT    Recommendations for Other Services       Precautions / Restrictions Precautions Precautions:  Fall Restrictions Weight Bearing Restrictions: No      Mobility  Bed Mobility Overal bed mobility: Needs Assistance Bed Mobility: Supine to Sit     Supine to sit: Min assist     General bed mobility comments: required assistance to pull self up   Transfers Overall transfer level: Needs assistance Equipment used: Rolling walker (2 wheeled) Transfers: Sit to/from [facility] Sit to Stand: Min guard Stand pivot transfers: Min guard       General transfer comment: labored movement  Ambulation/Gait Ambulation/Gait assistance: Supervision Ambulation Distance (Feet): 35 Feet Assistive device: Rolling walker (2 wheeled) Gait Pattern/deviations: Decreased step length - right;Decreased step length - left;Decreased stride length Gait velocity: decreased   General Gait Details: demnostrates slightly labored slow cadence without loss of balance, limited secondary to c/o fatigue, on 2 LPM O2  Stairs            Wheelchair Mobility    Modified Rankin (Stroke Patients Only)       Balance Overall balance assessment: Needs assistance Sitting-balance support: Feet supported;No upper extremity supported Sitting balance-Leahy Scale: Good     Standing balance support: Bilateral upper extremity supported;During functional activity Standing balance-Leahy Scale: Fair                               Pertinent Vitals/Pain Pain Assessment: Faces Faces Pain Scale: Hurts a little bit Pain Location: chest pain when leaning on RW Pain Descriptors / Indicators: Discomfort Pain Intervention(s): Limited activity within patient's tolerance;Monitored during session    Home Living Family/patient  expects to be discharged to:: Private residence Living Arrangements: Alone Available Help at Discharge: Friend(s) Type of Home: Mobile home Home Access: [place]: One level Home Equipment: Environmental consultant - 2 wheels;Cane - single point;Shower  seat;Bedside commode;Wheelchair - manual      Prior Function Level of Independence: Independent with assistive device(s)         Comments: short distanced community gait with RW, drives     Hand Dominance   Dominant Hand: Right    Extremity/Trunk Assessment   Upper Extremity Assessment Upper Extremity Assessment: Generalized weakness    Lower Extremity Assessment Lower Extremity Assessment: Generalized weakness    Cervical / Trunk Assessment Cervical / Trunk Assessment: Normal  Communication   Communication: No difficulties  Cognition Arousal/Alertness: Awake/alert Behavior During Therapy: WFL for tasks assessed/performed Overall Cognitive Status: Within Functional Limits for tasks assessed                                        General Comments      Exercises     Assessment/Plan    PT Assessment Patient needs continued PT services  PT Problem List Decreased strength;Decreased activity tolerance;Decreased balance;Decreased mobility       PT Treatment Interventions Gait training;Functional mobility training;Therapeutic activities;Therapeutic exercise;Wheelchair mobility training;Patient/family education    PT Goals (Current goals can be found in the Care Plan section)  Acute Rehab PT Goals Patient Stated Goal: return home PT Goal Formulation: With patient Time For Goal Achievement: 09/08/17 Potential to Achieve Goals: Good    Frequency Min 3X/week   Barriers to discharge        Co-evaluation               AM-PAC PT "6 Clicks" Daily Activity  Outcome Measure Difficulty turning over in bed (including adjusting bedclothes, sheets and blankets)?: None Difficulty moving from lying on back to sitting on the side of the bed? : A Little Difficulty sitting down on and standing up from a chair with arms (e.g., wheelchair, bedside commode, etc,.)?: A Little Help needed moving to and from a bed to chair (including a wheelchair)?: A  Little Help needed walking in hospital room?: A Little Help needed climbing 3-5 steps with a railing? : A Lot 6 Click Score: 18    End of Session   Activity Tolerance: Patient tolerated treatment well;Patient limited by fatigue Patient left: in chair;Other (comment)(Patient transferred to wheelchair to be taken for a procedure) Nurse Communication: Mobility status PT Visit Diagnosis: Unsteadiness on feet (R26.81);Other abnormalities of gait and mobility (R26.89);Muscle weakness (generalized) (M62.81)    Time: 6269-4854 PT Time Calculation (min) (ACUTE ONLY): 37 min   Charges:   PT Evaluation $PT Eval Moderate Complexity: 1 Mod PT Treatments $Gait Training: 8-22 mins $Therapeutic Activity: 8-22 mins   PT G Codes:        12:26 PM, 2017-09-19 Lonell Grandchild, MPT Physical Therapist with [facility] [phone] office [phone] mobile phone

## 2017-09-03 NOTE — Progress Notes (Signed)
PROGRESS NOTE                                                                                                                                                                                                             Patient Demographics:    Stephanie Combs, is a 67 y.o. female, DOB - 1950-09-21, EYC:[id number]  Admit date - 09/02/2017   Admitting Physician Pratik Darleen Crocker, DO  Outpatient Primary MD for the patient is [name], MD  LOS - 0  Outpatient Specialists: Dr Lorelee New  Chief Complaint  Patient presents with  . Bradycardia  . Weakness       Brief Narrative   67 year old really obese female with coronary artery disease with recent unstable angina requiring cardiac cath (in January 2019 with proximal LAD lesion of 95% stenosed with 80% stenosed sidebranch in Ost 1st diagonal) with a drug-eluting stent and balloon angioplasty of the diagonal branch, on DAPT, chronic diastolic CHF, hypertension, dyslipidemia, CKD stage III, type 2 diabetes mellitus, hypothyroidism, anemia, COPD presented with a syncopal episode at home.  Patient was messaging some of her friends when she suddenly passed out.  Denies having prodromal symptoms.  Patient was found on the floor by her neighbor who was passing by and patient does not remember how long she was on the floor for.  She denies similar symptoms in the past, not sure if she had a witnessed seizure, denies bowel or urinary incontinence.  She reports she took her last pain medications the previous night and had not taken her antianxiety medicine on the morning of admission. Patient reports poor appetite and has lost almost 80 pounds weight in the past several months. In the ED heart rate was found to be in the 50s-60s and was found to be drowsy during conversation.  Blood work showed TSH markedly suppressed at 0.052 and blood glucose of 233.  Chest x-ray and head CT were unremarkable for  acute findings. Patient placed on observation for further management.    Subjective:   She reports having pain in her arms and back and also feels anxious requesting for her pain and antianxiety medications.  Feels lightheaded on ambulating to the bathroom.   Assessment  & Plan :    Principal Problem:   Syncope and collapse Suspect orthostatic syncope with significant drop in blood pressure to  60 systolic on standing.  Other possibilities include markedly suppressed thyroid function and polypharmacy (patient is on Xanax, Neurontin, oxycodone and Zanaflex for pain)  Stable on telemetry without signs of bradycardia.  Added IV fluids.  Hold all home blood pressure medications. Repeat orthostasis in a.m.  If symptoms persistent despite blood pressure improved she may need outpatient 30-day Holter monitoring. Recent 2D echo results reviewed.  Carotid Doppler showing moderate plaques without significant stenosis.  EEG ordered.  PT recommends home health.   Active Problems: Hypothyroidism Markedly suppressed TSH.  On high-dose Synthroid (224 mcg) which is reduced to minimum dose of 25 mcg daily.  Elevated free T4 of 2.06.  On reviewing her labs she did have suppressed TSH back in January but her dose was not reduced at that time.  Diabetes mellitus type 2, uncontrolled with hyperglycemia and peripheral neuropathy A1c of 8.2.  Continue home dose Levemir with sliding scale coverage.  Continue Neurontin.    COPD (chronic obstructive pulmonary disease) (HCC) Stable.  Continue home inhaler.    Morbid obesity ([place]) Needs counseling on diet and exercise.  Patient does report unintentional significant weight loss.  DM type 2 (diabetes mellitus, type 2) (HCC)   HTN (hypertension), benign   CKD (chronic kidney disease), stage III (HCC) Stable.     Chronic diastolic CHF (congestive heart failure) (HCC) Euvolemic  Coronary artery disease with recent unstable angina Underwent cardiac cath  with DES to LAD and balloon angioplasty of diagonal branch.  On DAPT with aspirin and Plavix for at least one year    Code Status : Full code  Family Communication  : None at bedside  Disposition Plan  : Meet home health tomorrow if blood pressure stable.  Barriers For Discharge : Active symptoms, orthostasis  Consults  : None  Procedures  : CT head, carotid ultrasound  DVT Prophylaxis  :  Lovenox -   Lab Results  Component Value Date   PLT 274 09/03/2017    Antibiotics  :    Anti-infectives (From admission, onward)   None        Objective:   Vitals:   09/03/17 0412 09/03/17 0940 09/03/17 1023 09/03/17 1434  BP: (!) 169/65 (!) 151/52  134/67  Pulse: 63   70  Resp: 20   18  Temp: 98.5 F (36.9 C)   97.8 F (36.6 C)  TempSrc: Oral   Oral  SpO2: 100%  92% 100%  Weight: 119.2 kg (262 lb 12.6 oz)     Height:        Wt Readings from Last 3 Encounters:  09/03/17 119.2 kg (262 lb 12.6 oz)  07/21/17 122 kg (269 lb)  05/26/17 124.6 kg (274 lb 9.6 oz)     Intake/Output Summary (Last 24 hours) at 09/03/2017 1442 Last data filed at 09/03/2017 0600 Gross per 24 hour  Intake 740 ml  Output 1500 ml  Net -760 ml     Physical Exam  Gen: not in distress, fatigued HEENT: no pallor, moist mucosa, supple neck Chest: clear b/l, no added sounds CVS: N S1&S2, no murmurs,  GI: soft, NT, ND,  Musculoskeletal: warm, no edema CNS: Alert and oriented, nonfocal    Data Review:    CBC Recent Labs  Lab 09/02/17 1845 09/03/17 0501  WBC 7.3 7.0  HGB 10.6* 10.4*  HCT 33.4* 33.3*  PLT 284 274  MCV 88.4 88.8  MCH 28.0 27.7  MCHC 31.7 31.2  RDW 14.4 14.2  LYMPHSABS 2.1  --  MONOABS 0.6  --   EOSABS 0.3  --   BASOSABS 0.1  --     Chemistries  Recent Labs  Lab 09/02/17 1845 09/03/17 0501  NA 135 140  K 4.2 3.5  CL 101 103  CO2 25 25  GLUCOSE 233* 135*  BUN 24* 19  CREATININE 1.45* 1.20*  CALCIUM 8.9 8.8*  AST 17  --   ALT 12*  --   ALKPHOS 91  --     BILITOT 0.7  --    ------------------------------------------------------------------------------------------------------------------ No results for input(s): CHOL, HDL, LDLCALC, TRIG, CHOLHDL, LDLDIRECT in the last 72 hours.  Lab Results  Component Value Date   HGBA1C 8.2 (H) 09/03/2017   ------------------------------------------------------------------------------------------------------------------ Recent Labs    09/02/17 1846  TSH 0.052*   ------------------------------------------------------------------------------------------------------------------ No results for input(s): VITAMINB12, FOLATE, FERRITIN, TIBC, IRON, RETICCTPCT in the last 72 hours.  Coagulation profile No results for input(s): INR, PROTIME in the last 168 hours.  No results for input(s): DDIMER in the last 72 hours.  Cardiac Enzymes No results for input(s): CKMB, TROPONINI, MYOGLOBIN in the last 168 hours.  Invalid input(s): CK ------------------------------------------------------------------------------------------------------------------    Component Value Date/Time   BNP 108.0 (H) 05/01/2017 1000    Inpatient Medications  Scheduled Meds: . aspirin EC  81 mg Oral Daily  . atorvastatin  80 mg Oral q1800  . Chlorhexidine Gluconate Cloth  6 each Topical Q0600  . cholecalciferol  5,000 Units Oral Daily  . clopidogrel  75 mg Oral Daily  . enoxaparin (LOVENOX) injection  0.5 mg/kg Subcutaneous Q24H  . ferrous sulfate  325 mg Oral Q breakfast  . gabapentin  300 mg Oral TID  . insulin aspart  0-20 Units Subcutaneous TID WC  . insulin aspart  0-5 Units Subcutaneous QHS  . insulin detemir  52 Units Subcutaneous QHS  . isosorbide mononitrate  30 mg Oral Daily  . [START ON 09/04/2017] levothyroxine  25 mcg Oral Q0600  . liraglutide  1.2 mg Subcutaneous Daily  . mupirocin ointment  1 application Nasal BID  . nystatin   Topical BID  . omega-3 acid ethyl esters  1 g Oral Daily  . pantoprazole  40 mg  Oral Daily  . silver sulfADIAZINE  1 application Topical Daily  . sodium chloride flush  3 mL Intravenous Q12H  . topiramate  75 mg Oral Daily  . triamcinolone cream  1 application Topical QID  . umeclidinium-vilanterol  1 puff Inhalation Daily   Continuous Infusions: . sodium chloride    . sodium chloride     PRN Meds:.sodium chloride, acetaminophen **OR** acetaminophen, ALPRAZolam, hydrALAZINE, ipratropium-albuterol, ondansetron **OR** ondansetron (ZOFRAN) IV, oxyCODONE-acetaminophen **AND** oxyCODONE, polyethylene glycol, sodium chloride flush, tiZANidine  Micro Results Recent Results (from the past 240 hour(s))  MRSA PCR Screening     Status: Abnormal   Collection Time: 09/03/17  2:30 AM  Result Value Ref Range Status   MRSA by PCR POSITIVE (A) NEGATIVE Final    Comment:        The GeneXpert MRSA Assay (FDA approved for NASAL specimens only), is one component of a comprehensive MRSA colonization surveillance program. It is not intended to diagnose MRSA infection nor to guide or monitor treatment for MRSA infections. RESULT CALLED TO, READ BACK BY AND VERIFIED WITH: LIGHTNER,N AT 0650 BY HUFFINES,S ON 09/03/17. Performed at [facility], [address] [place], [place] [address]     Radiology Reports Dg Chest 2 View  Result Date: 09/02/2017 CLINICAL DATA:  Altered mental status.  EXAM: CHEST - 2 VIEW COMPARISON:  Single-view of the chest 05/15/2017. CT chest 01/27/2017. FINDINGS: Lungs clear. Heart size normal. No pneumothorax or pleural fluid. No acute bony abnormality. IMPRESSION: No acute disease. Electronically Signed   By: Inge Rise M.D.   On: 09/02/2017 19:49   Ct Head Wo Contrast  Result Date: 09/02/2017 CLINICAL DATA:  Altered level of consciousness. EXAM: CT HEAD WITHOUT CONTRAST TECHNIQUE: Contiguous axial images were obtained from the base of the skull through the vertex without intravenous contrast. COMPARISON:  12/06/2015 FINDINGS: Brain: There is  atrophy and chronic small vessel disease changes. No acute intracranial abnormality. Specifically, no hemorrhage, hydrocephalus, mass lesion, acute infarction, or significant intracranial injury. Vascular: No hyperdense vessel or unexpected calcification. Skull: No acute calvarial abnormality.  Extensive hyperostosis. Sinuses/Orbits: Visualized paranasal sinuses and mastoids clear. Orbital soft tissues unremarkable. Other: None IMPRESSION: No acute intracranial abnormality. Atrophy, chronic microvascular disease. Electronically Signed   By: Rolm Baptise M.D.   On: 09/02/2017 19:49   US Carotid Bilateral  Result Date: 09/03/2017 CLINICAL DATA:  67 year old female with a history of syncope. Cardiovascular risk factors include hypertension, stroke/TIA, known coronary disease, diabetes, tobacco use EXAM: BILATERAL CAROTID DUPLEX ULTRASOUND TECHNIQUE: Pearline Cables scale imaging, color Doppler and duplex ultrasound were performed of bilateral carotid and vertebral arteries in the neck. COMPARISON:  12/06/2015 FINDINGS: Criteria: Quantification of carotid stenosis is based on velocity parameters that correlate the residual internal carotid diameter with NASCET-based stenosis levels, using the diameter of the distal internal carotid lumen as the denominator for stenosis measurement. The following velocity measurements were obtained: RIGHT ICA:  Systolic 951 cm/sec, Diastolic 12 cm/sec CCA:  884 cm/sec SYSTOLIC ICA/CCA RATIO:  1.0 ECA:  229 cm/sec LEFT ICA:  Systolic 93 cm/sec, Diastolic 19 cm/sec CCA:  166 cm/sec SYSTOLIC ICA/CCA RATIO:  0.9 ECA:  75 cm/sec Right Brachial SBP: Not acquired Left Brachial SBP: Not acquired RIGHT CAROTID ARTERY: No significant calcifications of the right common carotid artery. Intermediate waveform maintained. Moderate heterogeneous and partially calcified plaque at the right carotid bifurcation. No significant lumen shadowing. Low resistance waveform of the right ICA. No significant tortuosity.  RIGHT VERTEBRAL ARTERY: Antegrade flow with low resistance waveform. LEFT CAROTID ARTERY: No significant calcifications of the left common carotid artery. Intermediate waveform maintained. Moderate heterogeneous and partially calcified plaque at the left carotid bifurcation. No significant lumen shadowing. Low resistance waveform of the left ICA. No significant tortuosity. LEFT VERTEBRAL ARTERY:  Antegrade flow with low resistance waveform. IMPRESSION: Color duplex indicates moderate heterogeneous and calcified plaque, with no hemodynamically significant stenosis by duplex criteria in the extracranial cerebrovascular circulation. Signed, Dulcy Fanny. Earleen Newport, DO [facility] Radiology Electronically Signed   By: Corrie Mckusick D.O.   On: 09/03/2017 11:56    Time Spent in minutes  25   Emerick Weatherly M.D on 09/03/2017 at 2:42 PM  Between 7am to 7pm - Pager - [phone]  After 7pm go to [email or web address] - password [facility]  [facility] -  Office  [phone]

## 2017-09-04 ENCOUNTER — Telehealth: Payer: Self-pay

## 2017-09-04 DIAGNOSIS — I5032 Chronic diastolic (congestive) heart failure: Secondary | ICD-10-CM | POA: Diagnosis not present

## 2017-09-04 DIAGNOSIS — I951 Orthostatic hypotension: Secondary | ICD-10-CM | POA: Diagnosis not present

## 2017-09-04 DIAGNOSIS — R55 Syncope and collapse: Secondary | ICD-10-CM

## 2017-09-04 DIAGNOSIS — I498 Other specified cardiac arrhythmias: Secondary | ICD-10-CM | POA: Diagnosis not present

## 2017-09-04 DIAGNOSIS — E032 Hypothyroidism due to medicaments and other exogenous substances: Secondary | ICD-10-CM | POA: Diagnosis not present

## 2017-09-04 DIAGNOSIS — Z794 Long term (current) use of insulin: Secondary | ICD-10-CM

## 2017-09-04 DIAGNOSIS — E0865 Diabetes mellitus due to underlying condition with hyperglycemia: Secondary | ICD-10-CM

## 2017-09-04 LAB — URINE CULTURE
Culture: NO GROWTH
Special Requests: NORMAL

## 2017-09-04 LAB — GLUCOSE, CAPILLARY
Glucose-Capillary: 244 mg/dL — ABNORMAL HIGH (ref 65–99)
Glucose-Capillary: 169 mg/dL — ABNORMAL HIGH (ref 65–99)
Glucose-Capillary: 137 mg/dL — ABNORMAL HIGH (ref 65–99)

## 2017-09-04 LAB — T3, FREE: T3, Free: 2.4 pg/mL (ref 2.0–4.4)

## 2017-09-04 MED ORDER — LEVOTHYROXINE SODIUM 25 MCG PO TABS: 25.0000 ug | ORAL_TABLET | Freq: Every day | ORAL | 0 refills | Status: DC

## 2017-09-04 MED ORDER — METOPROLOL TARTRATE 50 MG PO TABS: 25.0000 mg | ORAL_TABLET | Freq: Two times a day (BID) | ORAL | 6 refills | Status: DC

## 2017-09-04 MED ORDER — MUPIROCIN 2 % EX OINT: 1.0000 "application " | TOPICAL_OINTMENT | Freq: Two times a day (BID) | CUTANEOUS | 0 refills | Status: AC

## 2017-09-04 MED ORDER — FUROSEMIDE 40 MG PO TABS: 20.0000 mg | ORAL_TABLET | Freq: Every day | ORAL | 0 refills | Status: DC

## 2017-09-04 NOTE — Progress Notes (Signed)
Inpatient Diabetes Program Recommendations  AACE/ADA: New Consensus Statement on Inpatient Glycemic Control (2015)  Target Ranges:  Prepandial:   less than 140 mg/dL      Peak postprandial:   less than 180 mg/dL (1-2 hours)      Critically ill patients:  140 - 180 mg/dL  Results for Stephanie Combs, Stephanie Combs (MRN [id number]) as of 09/04/2017 09:17  Ref. Range 09/03/2017 07:30 09/03/2017 11:24 09/03/2017 16:11 09/03/2017 21:57 09/04/2017 07:37  Glucose-Capillary Latest Ref Range: 65 - 99 mg/dL 116 (H) 189 (H) 227 (H) 223 (H) 244 (H)    Review of Glycemic Control  Diabetes history: DM2 Outpatient Diabetes medications: Levemir 52 units QHS, Novolog 0-18 units TID, Victoza 1.2 mg daily Current orders for Inpatient glycemic control:  Levemir 52 units QHS, Novolog 0-20units TID, Novolog 0-5 units QHS, Victoza 1.2 mg daily  Inpatient Diabetes Program Recommendations: Insulin - Meal Coverage: Please consider ordering Novolog 5 units TID with meals for meal coverage if patient eats at least 50% of meals.  Thanks, Barnie Alderman, RN, MSN, CDE Diabetes Coordinator Inpatient Diabetes Program [phone] (Team Pager from 8am to 5pm)

## 2017-09-04 NOTE — Discharge Instructions (Signed)
Orthostatic Hypotension Orthostatic hypotension is a sudden drop in blood pressure that happens when you quickly change positions, such as when you get up from a seated or lying position. Blood pressure is a measurement of how strongly, or weakly, your blood is pressing against the walls of your arteries. Arteries are blood vessels that carry blood from your heart throughout your body. When blood pressure is too low, you may not get enough blood to your brain or to the rest of your organs. This can cause weakness, light-headedness, rapid heartbeat, and fainting. This can last for just a few seconds or for up to a few minutes. Orthostatic hypotension is usually not a serious problem. However, if it happens frequently or gets worse, it may be a sign of something more serious. What are the causes? This condition may be caused by:  Sudden changes in posture, such as standing up quickly after you have been sitting or lying down.  Blood loss.  Loss of body fluids (dehydration).  Heart problems.  Hormone (endocrine) problems.  Pregnancy.  Severe infection.  Lack of certain nutrients.  Severe allergic reactions (anaphylaxis).  Certain medicines, such as blood pressure medicine or medicines that make the body lose excess fluids (diuretics). Sometimes, this condition can be caused by not taking medicine as directed, such as taking too much of a certain medicine.  What increases the risk? Certain factors can make you more likely to develop orthostatic hypotension, including:  Age. Risk increases as you get older.  Conditions that affect the heart or the central nervous system.  Taking certain medicines, such as blood pressure medicine or diuretics.  Being pregnant.  What are the signs or symptoms? Symptoms of this condition may include:  Weakness.  Light-headedness.  Dizziness.  Blurred vision.  Fatigue.  Rapid heartbeat.  Fainting, in severe cases.  How is this  diagnosed? This condition is diagnosed based on:  Your medical history.  Your symptoms.  Your blood pressure measurement. Your health care provider will check your blood pressure when you are: ? Lying down. ? Sitting. ? Standing.  A blood pressure reading is recorded as two numbers, such as "120 over 80" (or 120/80). The first ("top") number is called the systolic pressure. It is a measure of the pressure in your arteries as your heart beats. The second ("bottom") number is called the diastolic pressure. It is a measure of the pressure in your arteries when your heart relaxes between beats. Blood pressure is measured in a unit called mm Hg. Healthy blood pressure for adults is 120/80. If your blood pressure is below 90/60, you may be diagnosed with hypotension. Other information or tests that may be used to diagnose orthostatic hypotension include:  Your other vital signs, such as your heart rate and temperature.  Blood tests.  Tilt table test. For this test, you will be safely secured to a table that moves you from a lying position to an upright position. Your heart rhythm and blood pressure will be monitored during the test.  How is this treated? Treatment for this condition may include:  Changing your diet. This may involve eating more salt (sodium) or drinking more water.  Taking medicines to raise your blood pressure.  Changing the dosage of certain medicines you are taking that might be lowering your blood pressure.  Wearing compression stockings. These stockings help to prevent blood clots and reduce swelling in your legs.  In some cases, you may need to go to the hospital for:    Fluid replacement. This means you will receive fluids through an IV tube.  Blood replacement. This means you will receive donated blood through an IV tube (transfusion).  Treating an infection or heart problems, if this applies.  Monitoring. You may need to be monitored while medicines that you  are taking wear off.  Follow these instructions at home: Eating and drinking   Drink enough fluid to keep your urine clear or pale yellow.  Eat a healthy diet and follow instructions from your health care provider about eating or drinking restrictions. A healthy diet includes: ? Fresh fruits and vegetables. ? Whole grains. ? Lean meats. ? Low-fat dairy products.  Eat extra salt only as directed. Do not add extra salt to your diet unless your health care provider told you to do that.  Eat frequent, small meals.  Avoid standing up suddenly after eating. Medicines  Take over-the-counter and prescription medicines only as told by your health care provider. ? Follow instructions from your health care provider about changing the dosage of your current medicines, if this applies. ? Do not stop or adjust any of your medicines on your own. General instructions  Wear compression stockings as told by your health care provider.  Get up slowly from lying down or sitting positions. This gives your blood pressure a chance to adjust.  Avoid hot showers and excessive heat as directed by your health care provider.  Return to your normal activities as told by your health care provider. Ask your health care provider what activities are safe for you.  Do not use any products that contain nicotine or tobacco, such as cigarettes and e-cigarettes. If you need help quitting, ask your health care provider.  Keep all follow-up visits as told by your health care provider. This is important. Contact a health care provider if:  You vomit.  You have diarrhea.  You have a fever for more than 2-3 days.  You feel more thirsty than usual.  You feel weak and tired. Get help right away if:  You have chest pain.  You have a fast or irregular heartbeat.  You develop numbness in any part of your body.  You cannot move your arms or your legs.  You have trouble speaking.  You become sweaty or feel  lightheaded.  You faint.  You feel short of breath.  You have trouble staying awake.  You feel confused. This information is not intended to replace advice given to you by your health care provider. Make sure you discuss any questions you have with your health care provider. Document Released: 04/18/2002 Document Revised: 01/15/2016 Document Reviewed: 10/19/2015 Elsevier Interactive Patient Education  2018 [employer]  

## 2017-09-04 NOTE — Discharge Summary (Addendum)
Physician Discharge Summary  Stephanie Combs BJY:[id number] DOB: 1950-07-03 DOA: 09/02/2017  PCP: [name], MD  Admit date: 09/02/2017 Discharge date: 09/04/2017  Admitted From: home Disposition:  home  Recommendations for Outpatient Follow-up:  1. Follow up with PCP in 1 week.  Cardiology office will arrange for outpatient 30-day Holter monitoring.  She should follow-up with her cardiologist in the next 4 weeks.  Home Health: RN, PT, aid Equipment/Devices: Chronic 2 L via nasal cannula  Discharge Condition: Fair CODE STATUS: Full code Diet recommendation: Heart Healthy / Carb Modified   Discharge Diagnoses:  Principal Problem:   Syncope due to orthostatic hypotension   Active Problems:   Hypothyroidism   COPD (chronic obstructive pulmonary disease) (HCC)   Morbid obesity (HCC)   DM type 2 (diabetes mellitus, type 2) (HCC)   HTN (hypertension), benign   CKD (chronic kidney disease), stage III (HCC)   Chronic diastolic CHF (congestive heart failure) (HCC)   Anemia in CKD (chronic kidney disease)   Edema of lower extremity   Skin ulcer (HCC)   Syncope and collapse   Sinus arrhythmia   Obesity, Class III, BMI 40-49.9 (morbid obesity) ([place])  Brief narrative/HPI Please refer to admission H&P for details, in brief,67 year old really obese female with coronary artery disease with recent unstable angina requiring cardiac cath (in January 2019 with proximal LAD lesion of 95% stenosed with 80% stenosed sidebranch in Ost 1st diagonal) with a drug-eluting stent and balloon angioplasty of the diagonal branch, on DAPT, chronic diastolic CHF, hypertension, dyslipidemia, CKD stage III, type 2 diabetes mellitus, hypothyroidism, anemia, COPD presented with a syncopal episode at home.  Patient was messaging some of her friends when she suddenly passed out.  Denies having prodromal symptoms.  Patient was found on the floor by her neighbor who was passing by and patient does not remember how  long she was on the floor for.  She denies similar symptoms in the past, not sure if she had a witnessed seizure, denies bowel or urinary incontinence.  She reports she took her last pain medications the previous night and had not taken her antianxiety medicine on the morning of admission. Patient reports poor appetite and has lost almost 80 pounds weight in the past several months. In the ED heart rate was found to be in the 50s-60s and was found to be drowsy during conversation.  Blood work showed TSH markedly suppressed at 0.052 and blood glucose of 233.  Chest x-ray and head CT were unremarkable for acute findings. Patient placed on observation for further management.  Hospital course  Principal Problem:   Syncope and collapse Likely orthostatic syncope with significant drop in blood pressure to 60 systolic on standing.  Other possibilities include markedly suppressed thyroid function and polypharmacy (patient is on Xanax, Neurontin, oxycodone and Zanaflex for pain).  Stable on telemetry without signs of bradycardia (heart rate reportedly was in the 50s on presentation).  Orthostasis now improved with IV fluids and holding all home blood pressure medications. Does not need EEG as seizure is unlikely.  Carotid Doppler showing moderate plaques without significant stenosis.  Recent 2D echo results reviewed and does not need to be repeated. Discussed with cardiology to arrange outpatient 30-day event monitor. Imdur on hold.  Reduced both Lasix and metoprolol dose.  Seen by PT and recommends home health.   Active Problems:  Sinus arrhythmia No signs of A. fib on the monitor.  Her CHADS2 vasc is 4 and given her syncope she would benefit  from getting a 30-day event monitor to capture any A. fib..  I have discussed with cardiology and will arrange it as outpatient.  Hypothyroidism Markedly suppressed TSH.  On high-dose Synthroid (224 mcg) which is reduced to minimum dose of 25 mcg daily.   (Prescription given). Elevated free T4 of 2.06.  On reviewing her labs she did have suppressed TSH back in January but her dose was not reduced at that time.  Diabetes mellitus type 2, uncontrolled with hyperglycemia and peripheral neuropathy A1c of 8.2.  Continue home dose Levemir with sliding scale coverage.    Resume Victoza upon discharge.  Continue Neurontin.    COPD (chronic obstructive pulmonary disease) (HCC) Stable.    On 2 L home O2.  Continue home inhaler.    Morbid obesity ([place]) Needs counseling on diet and exercise.  Patient does report unintentional significant weight loss which should be followed as outpatient.      CKD (chronic kidney disease), stage III (HCC) Stable.     Chronic diastolic CHF (congestive heart failure) (HCC) Euvolemic.  I will reduce her Lasix dose given her significant orthostasis.  Coronary artery disease with recent unstable angina Underwent cardiac cath with DES to LAD and balloon angioplasty of diagonal branch.  On DAPT with aspirin and Plavix for at least one year.  Will reduce metoprolol dose due to orthostasis.  Hold Imdur until blood pressure remained stable as outpatient.      Family Communication  : None at bedside  Disposition Plan  :  Home  Consults  : None  Procedures  : CT head, carotid ultrasound     Discharge Instructions   Allergies as of 09/04/2017      Reactions   Dilaudid [hydromorphone Hcl] Itching   Actifed Cold-allergy [chlorpheniramine-phenylephrine]    "I was sick and red and it didn't agree with me at all"   Doxycycline Nausea And Vomiting   Other Cough   Pt states she is allergic to ragweed and that she starts coughing and sneezing like crazy   Penicillins Hives, Itching   Tolerates Rocephin Has patient had a PCN reaction causing immediate rash, facial/tongue/throat swelling, SOB or lightheadedness with hypotension: Yes Has patient had a PCN reaction causing severe rash involving mucus  membranes or skin necrosis: No Has patient had a PCN reaction that required hospitalization Yes Has patient had a PCN reaction occurring within the last 10 years: No If all of the above answers are "NO", then may proceed with Cephalosporin use.   Reglan [metoclopramide] Itching   Valium [diazepam] Itching   Vistaril [hydroxyzine Hcl] Itching      Medication List    STOP taking these medications   isosorbide mononitrate 30 MG 24 hr tablet Commonly known as:  IMDUR     TAKE these medications   albuterol 108 (90 Base) MCG/ACT inhaler Commonly known as:  PROAIR HFA Inhale 1 puff into the lungs every 4 (four) hours as needed. For shortness of breath What changed:    how much to take  additional instructions   ALPRAZolam 1 MG tablet Commonly known as:  XANAX Take 1 mg by mouth 3 (three) times daily as needed for anxiety or sleep.   aspirin EC 81 MG tablet Take 81 mg by mouth daily.   atorvastatin 80 MG tablet Commonly known as:  LIPITOR Take 1 tablet (80 mg total) by mouth daily at 6 PM.   clopidogrel 75 MG tablet Commonly known as:  PLAVIX Take 1 tablet (75 mg total)  by mouth daily.   ferrous sulfate 325 (65 FE) MG tablet Take 1 tablet (325 mg total) by mouth daily with breakfast.   FISH OIL PO Take 1 capsule by mouth daily.   furosemide 40 MG tablet Commonly known as:  LASIX Take 0.5 tablets (20 mg total) by mouth daily. What changed:  how much to take   gabapentin 300 MG capsule Commonly known as:  NEURONTIN Take 1 capsule (300 mg total) by mouth 3 (three) times daily.   Gerhardt's butt cream Crea Apply 1 application topically 3 (three) times daily.   insulin aspart 100 UNIT/ML injection Commonly known as:  novoLOG Inject 0-9 Units into the skin 3 (three) times daily with meals. What changed:    how much to take  additional instructions   insulin detemir 100 UNIT/ML injection Commonly known as:  LEVEMIR Inject 0.52 mLs (52 Units total) into the skin  at bedtime.   levothyroxine 25 MCG tablet Commonly known as:  SYNTHROID, LEVOTHROID Take 1 tablet (25 mcg total) by mouth daily at 6 (six) AM. Start taking on:  09/05/2017 What changed:    medication strength  how much to take  when to take this   metoprolol tartrate 50 MG tablet Commonly known as:  LOPRESSOR Take 0.5 tablets (25 mg total) by mouth 2 (two) times daily. What changed:  See the new instructions.   mupirocin ointment 2 % Commonly known as:  BACTROBAN Place 1 application into the nose 2 (two) times daily for 5 days.   nystatin powder Commonly known as:  MYCOSTATIN/NYSTOP Apply topically 2 (two) times daily.   ondansetron 4 MG tablet Commonly known as:  ZOFRAN Take 4 mg by mouth every 8 (eight) hours as needed for vomiting.   oxyCODONE-acetaminophen 10-325 MG tablet Commonly known as:  PERCOCET Take 1 tablet by mouth every 6 (six) hours as needed for pain.   pantoprazole 40 MG tablet Commonly known as:  PROTONIX Take 1 tablet (40 mg total) by mouth daily.   polyethylene glycol packet Commonly known as:  MIRALAX / GLYCOLAX Take 17 g by mouth daily as needed for mild constipation or moderate constipation.   silver sulfADIAZINE 1 % cream Commonly known as:  SILVADENE Apply 1 application topically daily.   SURE COMFORT INS SYR 1CC/28G 28G X 1/2" 1 ML Misc Generic drug:  INS SYRINGE/NEEDLE 1CC/28G USE TO INJECT INSULIN UP TO 4 TIMES DAILY.   tiZANidine 4 MG tablet Commonly known as:  ZANAFLEX Take 4 mg by mouth every 8 (eight) hours as needed for muscle spasms.   topiramate 25 MG tablet Commonly known as:  TOPAMAX Take 75 mg by mouth daily.   triamcinolone cream 0.1 % Commonly known as:  KENALOG Apply 1 application topically 4 (four) times daily.   umeclidinium-vilanterol 62.5-25 MCG/INH Aepb Commonly known as:  ANORO ELLIPTA Inhale 1 puff into the lungs daily.   VICTOZA 18 MG/3ML Sopn Generic drug:  liraglutide Inject 1.2 mg into the skin  daily.   Vitamin D3 5000 units Caps Take 1 capsule (5,000 Units total) by mouth daily.      Follow-up Information    [name], MD. Schedule an appointment as soon as possible for a visit in 1 week(s).   Specialty:  Internal Medicine Contact information: [address] [place] [address] [phone]        Herminio Commons, MD. Schedule an appointment as soon as possible for a visit in 2 week(s).   Specialty:  Cardiology Contact information: [place] [address] [phone]          Allergies  Allergen Reactions  . Dilaudid [Hydromorphone Hcl] Itching  . Actifed Cold-Allergy [Chlorpheniramine-Phenylephrine]     "I was sick and red and it didn't agree with me at all"  . Doxycycline Nausea And Vomiting  . Other Cough    Pt states she is allergic to ragweed and that she starts coughing and sneezing like crazy  . Penicillins Hives and Itching    Tolerates Rocephin Has patient had a PCN reaction causing immediate rash, facial/tongue/throat swelling, SOB or lightheadedness with hypotension: Yes Has patient had a PCN reaction causing severe rash involving mucus membranes or skin necrosis: No Has patient had a PCN reaction that required hospitalization Yes Has patient had a PCN reaction occurring within the last 10 years: No If all of the above answers are "NO", then may proceed with Cephalosporin use.   . Reglan [Metoclopramide] Itching  . Valium [Diazepam] Itching  . Vistaril [Hydroxyzine Hcl] Itching        Procedures/Studies: Dg Chest 2 View  Result Date: 09/02/2017 CLINICAL DATA:  Altered mental status. EXAM: CHEST - 2 VIEW COMPARISON:  Single-view of the chest 05/15/2017. CT chest 01/27/2017. FINDINGS: Lungs clear. Heart size normal. No pneumothorax or pleural fluid. No acute bony abnormality. IMPRESSION: No acute disease. Electronically Signed   By: Inge Rise M.D.   On: 09/02/2017 19:49   Ct Head Wo  Contrast  Result Date: 09/02/2017 CLINICAL DATA:  Altered level of consciousness. EXAM: CT HEAD WITHOUT CONTRAST TECHNIQUE: Contiguous axial images were obtained from the base of the skull through the vertex without intravenous contrast. COMPARISON:  12/06/2015 FINDINGS: Brain: There is atrophy and chronic small vessel disease changes. No acute intracranial abnormality. Specifically, no hemorrhage, hydrocephalus, mass lesion, acute infarction, or significant intracranial injury. Vascular: No hyperdense vessel or unexpected calcification. Skull: No acute calvarial abnormality.  Extensive hyperostosis. Sinuses/Orbits: Visualized paranasal sinuses and mastoids clear. Orbital soft tissues unremarkable. Other: None IMPRESSION: No acute intracranial abnormality. Atrophy, chronic microvascular disease. Electronically Signed   By: Rolm Baptise M.D.   On: 09/02/2017 19:49   US Carotid Bilateral  Result Date: 09/03/2017 CLINICAL DATA:  67 year old female with a history of syncope. Cardiovascular risk factors include hypertension, stroke/TIA, known coronary disease, diabetes, tobacco use EXAM: BILATERAL CAROTID DUPLEX ULTRASOUND TECHNIQUE: Pearline Cables scale imaging, color Doppler and duplex ultrasound were performed of bilateral carotid and vertebral arteries in the neck. COMPARISON:  12/06/2015 FINDINGS: Criteria: Quantification of carotid stenosis is based on velocity parameters that correlate the residual internal carotid diameter with NASCET-based stenosis levels, using the diameter of the distal internal carotid lumen as the denominator for stenosis measurement. The following velocity measurements were obtained: RIGHT ICA:  Systolic 269 cm/sec, Diastolic 12 cm/sec CCA:  485 cm/sec SYSTOLIC ICA/CCA RATIO:  1.0 ECA:  229 cm/sec LEFT ICA:  Systolic 93 cm/sec, Diastolic 19 cm/sec CCA:  462 cm/sec SYSTOLIC ICA/CCA RATIO:  0.9 ECA:  75 cm/sec Right Brachial SBP: Not acquired Left Brachial SBP: Not acquired RIGHT CAROTID ARTERY:  No significant calcifications of the right common carotid artery. Intermediate waveform maintained. Moderate heterogeneous and partially calcified plaque at the right carotid bifurcation. No significant lumen shadowing. Low resistance waveform of the right ICA. No significant tortuosity. RIGHT VERTEBRAL ARTERY: Antegrade flow with low resistance waveform. LEFT CAROTID ARTERY: No significant calcifications of the left common carotid artery. Intermediate waveform maintained. Moderate heterogeneous and partially calcified plaque  at the left carotid bifurcation. No significant lumen shadowing. Low resistance waveform of the left ICA. No significant tortuosity. LEFT VERTEBRAL ARTERY:  Antegrade flow with low resistance waveform. IMPRESSION: Color duplex indicates moderate heterogeneous and calcified plaque, with no hemodynamically significant stenosis by duplex criteria in the extracranial cerebrovascular circulation. Signed, Dulcy Fanny. Earleen Newport, DO [facility] Electronically Signed   By: Corrie Mckusick D.O.   On: 09/03/2017 11:56      Subjective: Feels much better today and able to ambulate to the bathroom without feeling dizzy or lightheaded.  Discharge Exam: Vitals:   09/04/17 0805 09/04/17 0922  BP: (!) 142/75   Pulse: (!) 110   Resp:    Temp:    SpO2: 96% 95%   Vitals:   09/04/17 0801 09/04/17 0803 09/04/17 0805 09/04/17 0922  BP: (!) 143/70 128/66 (!) 142/75   Pulse: 70 91 (!) 110   Resp:      Temp:      TempSrc:      SpO2: 97%  96% 95%  Weight:      Height:        General: Elderly obese female not in distress HEENT: Moist mucosa, supple neck Chest: Clear to auscultation bilaterally CVs: Normal S1-S2, no murmurs GI: Soft, nondistended, nontender Musculoskeletal: Warm, no edema CNS: Alert and oriented, nonfocal    The results of significant diagnostics from this hospitalization (including imaging, microbiology, ancillary and  laboratory) are listed below for reference.     Microbiology: Recent Results (from the past 240 hour(s))  Urine culture     Status: None   Collection Time: 09/02/17  6:22 PM  Result Value Ref Range Status   Specimen Description   Final    URINE, CATHETERIZED Performed at [facility], [address], [place], [place] [address]    Special Requests   Final    Normal Performed at [facility], [address], [place], [place] [address]    Culture   Final    NO GROWTH Performed at [facility], [place] [address] [place], [place] [address]    Report Status 09/04/2017 FINAL  Final  MRSA PCR Screening     Status: Abnormal   Collection Time: 09/03/17  2:30 AM  Result Value Ref Range Status   MRSA by PCR POSITIVE (A) NEGATIVE Final    Comment:        The GeneXpert MRSA Assay (FDA approved for NASAL specimens only), is one component of a comprehensive MRSA colonization surveillance program. It is not intended to diagnose MRSA infection nor to guide or monitor treatment for MRSA infections. RESULT CALLED TO, READ BACK BY AND VERIFIED WITH: LIGHTNER,N AT 0650 BY HUFFINES,S ON 09/03/17. Performed at [facility], [address], [place], [place] [address]      Labs: BNP (last 3 results) Recent Labs    01/30/17 1816 04/27/17 1500 05/01/17 1000  BNP 115.6* 188.0* 283.1*   Basic Metabolic Panel: Recent Labs  Lab 09/02/17 1845 09/03/17 0501  NA 135 140  K 4.2 3.5  CL 101 103  CO2 25 25  GLUCOSE 233* 135*  BUN 24* 19  CREATININE 1.45* 1.20*  CALCIUM 8.9 8.8*   Liver Function Tests: Recent Labs  Lab 09/02/17 1845  AST 17  ALT 12*  ALKPHOS 91  BILITOT 0.7  PROT 6.4*  ALBUMIN 3.5   No results for input(s): LIPASE, AMYLASE in the last 168 hours. Recent Labs  Lab 09/02/17 1946  AMMONIA  10   CBC: Recent Labs  Lab 09/02/17 1845 09/03/17 0501  WBC 7.3 7.0  NEUTROABS 4.3  --   HGB 10.6* 10.4*  HCT 33.4* 33.3*  MCV 88.4 88.8  PLT 284 274   Cardiac  Enzymes: No results for input(s): CKTOTAL, CKMB, CKMBINDEX, TROPONINI in the last 168 hours. BNP: Invalid input(s): POCBNP CBG: Recent Labs  Lab 09/03/17 0730 09/03/17 1124 09/03/17 1611 09/03/17 2157 09/04/17 0737  GLUCAP 116* 189* 227* 223* 244*   D-Dimer No results for input(s): DDIMER in the last 72 hours. Hgb A1c Recent Labs    09/03/17 0502  HGBA1C 8.2*   Lipid Profile No results for input(s): CHOL, HDL, LDLCALC, TRIG, CHOLHDL, LDLDIRECT in the last 72 hours. Thyroid function studies Recent Labs    09/02/17 1846 09/02/17 1950  TSH 0.052*  --   T3FREE  --  2.4   Anemia work up No results for input(s): VITAMINB12, FOLATE, FERRITIN, TIBC, IRON, RETICCTPCT in the last 72 hours. Urinalysis    Component Value Date/Time   COLORURINE YELLOW 09/02/2017 1821   APPEARANCEUR CLEAR 09/02/2017 1821   LABSPEC 1.013 09/02/2017 1821   PHURINE 5.0 09/02/2017 1821   GLUCOSEU NEGATIVE 09/02/2017 1821   HGBUR SMALL (A) 09/02/2017 1821   BILIRUBINUR NEGATIVE 09/02/2017 1821   KETONESUR NEGATIVE 09/02/2017 1821   PROTEINUR NEGATIVE 09/02/2017 1821   UROBILINOGEN 0.2 12/04/2014 0650   NITRITE NEGATIVE 09/02/2017 1821   LEUKOCYTESUR NEGATIVE 09/02/2017 1821   Sepsis Labs Invalid input(s): PROCALCITONIN,  WBC,  LACTICIDVEN Microbiology Recent Results (from the past 240 hour(s))  Urine culture     Status: None   Collection Time: 09/02/17  6:22 PM  Result Value Ref Range Status   Specimen Description   Final    URINE, CATHETERIZED Performed at [facility], [address], [place], [place] [address]    Special Requests   Final    Normal Performed at [facility], [address], [place], [place] [address]    Culture   Final    NO GROWTH Performed at [facility], [place] [address] [place], [place] [address]    Report Status 09/04/2017 FINAL  Final  MRSA PCR Screening     Status: Abnormal   Collection Time: 09/03/17  2:30 AM  Result Value Ref Range Status   MRSA by  PCR POSITIVE (A) NEGATIVE Final    Comment:        The GeneXpert MRSA Assay (FDA approved for NASAL specimens only), is one component of a comprehensive MRSA colonization surveillance program. It is not intended to diagnose MRSA infection nor to guide or monitor treatment for MRSA infections. RESULT CALLED TO, READ BACK BY AND VERIFIED WITH: LIGHTNER,N AT 0650 BY HUFFINES,S ON 09/03/17. Performed at [facility], [address] [place], [place] [address]      Time coordinating discharge: <30 minutes  SIGNED:   Louellen Molder, MD  [facility] 09/04/2017, 11:07 AM Pager   If 7PM-7AM, please contact night-coverage [email or web address] Password [email or web address]

## 2017-09-04 NOTE — Telephone Encounter (Signed)
-----   Message from Erma Heritage, [place] sent at 09/04/2017 11:12 AM EDT ----- Regarding: 30-day monitor [facility] Hospitalist contacted me and asked that we order a 30-day cardiac event monitor for this patient for syncope. Being discharged today. Patient of Dr. [name].   Thanks,  [name]

## 2017-09-04 NOTE — Telephone Encounter (Signed)
I spoke with patient, confirmed address as [place], phone [phone].She understands that [employer] will call her and mail monitor via [employer] next week

## 2017-09-06 DIAGNOSIS — D631 Anemia in chronic kidney disease: Secondary | ICD-10-CM | POA: Diagnosis not present

## 2017-09-06 DIAGNOSIS — L89152 Pressure ulcer of sacral region, stage 2: Secondary | ICD-10-CM | POA: Diagnosis not present

## 2017-09-06 DIAGNOSIS — Z7902 Long term (current) use of antithrombotics/antiplatelets: Secondary | ICD-10-CM | POA: Diagnosis not present

## 2017-09-06 DIAGNOSIS — I5032 Chronic diastolic (congestive) heart failure: Secondary | ICD-10-CM | POA: Diagnosis not present

## 2017-09-06 DIAGNOSIS — J449 Chronic obstructive pulmonary disease, unspecified: Secondary | ICD-10-CM | POA: Diagnosis not present

## 2017-09-06 DIAGNOSIS — E1122 Type 2 diabetes mellitus with diabetic chronic kidney disease: Secondary | ICD-10-CM | POA: Diagnosis not present

## 2017-09-06 DIAGNOSIS — Z7982 Long term (current) use of aspirin: Secondary | ICD-10-CM | POA: Diagnosis not present

## 2017-09-06 DIAGNOSIS — I130 Hypertensive heart and chronic kidney disease with heart failure and stage 1 through stage 4 chronic kidney disease, or unspecified chronic kidney disease: Secondary | ICD-10-CM | POA: Diagnosis not present

## 2017-09-06 DIAGNOSIS — L304 Erythema intertrigo: Secondary | ICD-10-CM | POA: Diagnosis not present

## 2017-09-06 DIAGNOSIS — Z9981 Dependence on supplemental oxygen: Secondary | ICD-10-CM | POA: Diagnosis not present

## 2017-09-06 DIAGNOSIS — Z4800 Encounter for change or removal of nonsurgical wound dressing: Secondary | ICD-10-CM | POA: Diagnosis not present

## 2017-09-06 DIAGNOSIS — Z79891 Long term (current) use of opiate analgesic: Secondary | ICD-10-CM | POA: Diagnosis not present

## 2017-09-06 DIAGNOSIS — Z79810 Long term (current) use of selective estrogen receptor modulators (SERMs): Secondary | ICD-10-CM | POA: Diagnosis not present

## 2017-09-06 DIAGNOSIS — Z794 Long term (current) use of insulin: Secondary | ICD-10-CM | POA: Diagnosis not present

## 2017-09-06 DIAGNOSIS — N183 Chronic kidney disease, stage 3 (moderate): Secondary | ICD-10-CM | POA: Diagnosis not present

## 2017-09-06 DIAGNOSIS — S71112D Laceration without foreign body, left thigh, subsequent encounter: Secondary | ICD-10-CM | POA: Diagnosis not present

## 2017-09-06 DIAGNOSIS — L98492 Non-pressure chronic ulcer of skin of other sites with fat layer exposed: Secondary | ICD-10-CM | POA: Diagnosis not present

## 2017-09-06 NOTE — Progress Notes (Signed)
Patient discharged home with instructions given on medications,and follow up visits,patient verbalized understanding.Prescriptions sent to Pharmacy of choice documented on AVS. IV discontinued catheter intact. Accompanied by staff to an awaiting vehicle.

## 2017-09-08 DIAGNOSIS — S71112D Laceration without foreign body, left thigh, subsequent encounter: Secondary | ICD-10-CM | POA: Diagnosis not present

## 2017-09-08 DIAGNOSIS — L304 Erythema intertrigo: Secondary | ICD-10-CM | POA: Diagnosis not present

## 2017-09-08 DIAGNOSIS — Z79810 Long term (current) use of selective estrogen receptor modulators (SERMs): Secondary | ICD-10-CM | POA: Diagnosis not present

## 2017-09-08 DIAGNOSIS — D631 Anemia in chronic kidney disease: Secondary | ICD-10-CM | POA: Diagnosis not present

## 2017-09-08 DIAGNOSIS — Z7982 Long term (current) use of aspirin: Secondary | ICD-10-CM | POA: Diagnosis not present

## 2017-09-08 DIAGNOSIS — I130 Hypertensive heart and chronic kidney disease with heart failure and stage 1 through stage 4 chronic kidney disease, or unspecified chronic kidney disease: Secondary | ICD-10-CM | POA: Diagnosis not present

## 2017-09-08 DIAGNOSIS — L89152 Pressure ulcer of sacral region, stage 2: Secondary | ICD-10-CM | POA: Diagnosis not present

## 2017-09-08 DIAGNOSIS — N183 Chronic kidney disease, stage 3 (moderate): Secondary | ICD-10-CM | POA: Diagnosis not present

## 2017-09-08 DIAGNOSIS — Z79891 Long term (current) use of opiate analgesic: Secondary | ICD-10-CM | POA: Diagnosis not present

## 2017-09-08 DIAGNOSIS — Z4800 Encounter for change or removal of nonsurgical wound dressing: Secondary | ICD-10-CM | POA: Diagnosis not present

## 2017-09-08 DIAGNOSIS — L98492 Non-pressure chronic ulcer of skin of other sites with fat layer exposed: Secondary | ICD-10-CM | POA: Diagnosis not present

## 2017-09-08 DIAGNOSIS — E1122 Type 2 diabetes mellitus with diabetic chronic kidney disease: Secondary | ICD-10-CM | POA: Diagnosis not present

## 2017-09-08 DIAGNOSIS — I5032 Chronic diastolic (congestive) heart failure: Secondary | ICD-10-CM | POA: Diagnosis not present

## 2017-09-08 DIAGNOSIS — Z794 Long term (current) use of insulin: Secondary | ICD-10-CM | POA: Diagnosis not present

## 2017-09-08 DIAGNOSIS — J449 Chronic obstructive pulmonary disease, unspecified: Secondary | ICD-10-CM | POA: Diagnosis not present

## 2017-09-08 DIAGNOSIS — Z9981 Dependence on supplemental oxygen: Secondary | ICD-10-CM | POA: Diagnosis not present

## 2017-09-08 DIAGNOSIS — Z7902 Long term (current) use of antithrombotics/antiplatelets: Secondary | ICD-10-CM | POA: Diagnosis not present

## 2017-09-09 DIAGNOSIS — L98492 Non-pressure chronic ulcer of skin of other sites with fat layer exposed: Secondary | ICD-10-CM | POA: Diagnosis not present

## 2017-09-09 DIAGNOSIS — L304 Erythema intertrigo: Secondary | ICD-10-CM | POA: Diagnosis not present

## 2017-09-09 DIAGNOSIS — Z4800 Encounter for change or removal of nonsurgical wound dressing: Secondary | ICD-10-CM | POA: Diagnosis not present

## 2017-09-09 DIAGNOSIS — Z7902 Long term (current) use of antithrombotics/antiplatelets: Secondary | ICD-10-CM | POA: Diagnosis not present

## 2017-09-09 DIAGNOSIS — Z794 Long term (current) use of insulin: Secondary | ICD-10-CM | POA: Diagnosis not present

## 2017-09-09 DIAGNOSIS — Z79891 Long term (current) use of opiate analgesic: Secondary | ICD-10-CM | POA: Diagnosis not present

## 2017-09-09 DIAGNOSIS — E1122 Type 2 diabetes mellitus with diabetic chronic kidney disease: Secondary | ICD-10-CM | POA: Diagnosis not present

## 2017-09-09 DIAGNOSIS — Z79810 Long term (current) use of selective estrogen receptor modulators (SERMs): Secondary | ICD-10-CM | POA: Diagnosis not present

## 2017-09-09 DIAGNOSIS — N183 Chronic kidney disease, stage 3 (moderate): Secondary | ICD-10-CM | POA: Diagnosis not present

## 2017-09-09 DIAGNOSIS — I130 Hypertensive heart and chronic kidney disease with heart failure and stage 1 through stage 4 chronic kidney disease, or unspecified chronic kidney disease: Secondary | ICD-10-CM | POA: Diagnosis not present

## 2017-09-09 DIAGNOSIS — J449 Chronic obstructive pulmonary disease, unspecified: Secondary | ICD-10-CM | POA: Diagnosis not present

## 2017-09-09 DIAGNOSIS — S71112D Laceration without foreign body, left thigh, subsequent encounter: Secondary | ICD-10-CM | POA: Diagnosis not present

## 2017-09-09 DIAGNOSIS — D631 Anemia in chronic kidney disease: Secondary | ICD-10-CM | POA: Diagnosis not present

## 2017-09-09 DIAGNOSIS — L89152 Pressure ulcer of sacral region, stage 2: Secondary | ICD-10-CM | POA: Diagnosis not present

## 2017-09-09 DIAGNOSIS — I5032 Chronic diastolic (congestive) heart failure: Secondary | ICD-10-CM | POA: Diagnosis not present

## 2017-09-09 DIAGNOSIS — Z9981 Dependence on supplemental oxygen: Secondary | ICD-10-CM | POA: Diagnosis not present

## 2017-09-09 DIAGNOSIS — Z7982 Long term (current) use of aspirin: Secondary | ICD-10-CM | POA: Diagnosis not present

## 2017-09-10 DIAGNOSIS — E1165 Type 2 diabetes mellitus with hyperglycemia: Secondary | ICD-10-CM | POA: Diagnosis not present

## 2017-09-10 DIAGNOSIS — E782 Mixed hyperlipidemia: Secondary | ICD-10-CM | POA: Diagnosis not present

## 2017-09-10 DIAGNOSIS — E039 Hypothyroidism, unspecified: Secondary | ICD-10-CM | POA: Diagnosis not present

## 2017-09-11 DIAGNOSIS — E1122 Type 2 diabetes mellitus with diabetic chronic kidney disease: Secondary | ICD-10-CM | POA: Diagnosis not present

## 2017-09-11 DIAGNOSIS — I130 Hypertensive heart and chronic kidney disease with heart failure and stage 1 through stage 4 chronic kidney disease, or unspecified chronic kidney disease: Secondary | ICD-10-CM | POA: Diagnosis not present

## 2017-09-11 DIAGNOSIS — Z79810 Long term (current) use of selective estrogen receptor modulators (SERMs): Secondary | ICD-10-CM | POA: Diagnosis not present

## 2017-09-11 DIAGNOSIS — J449 Chronic obstructive pulmonary disease, unspecified: Secondary | ICD-10-CM | POA: Diagnosis not present

## 2017-09-11 DIAGNOSIS — Z79891 Long term (current) use of opiate analgesic: Secondary | ICD-10-CM | POA: Diagnosis not present

## 2017-09-11 DIAGNOSIS — Z7982 Long term (current) use of aspirin: Secondary | ICD-10-CM | POA: Diagnosis not present

## 2017-09-11 DIAGNOSIS — Z9981 Dependence on supplemental oxygen: Secondary | ICD-10-CM | POA: Diagnosis not present

## 2017-09-11 DIAGNOSIS — Z7902 Long term (current) use of antithrombotics/antiplatelets: Secondary | ICD-10-CM | POA: Diagnosis not present

## 2017-09-11 DIAGNOSIS — L89152 Pressure ulcer of sacral region, stage 2: Secondary | ICD-10-CM | POA: Diagnosis not present

## 2017-09-11 DIAGNOSIS — L304 Erythema intertrigo: Secondary | ICD-10-CM | POA: Diagnosis not present

## 2017-09-11 DIAGNOSIS — L98492 Non-pressure chronic ulcer of skin of other sites with fat layer exposed: Secondary | ICD-10-CM | POA: Diagnosis not present

## 2017-09-11 DIAGNOSIS — Z794 Long term (current) use of insulin: Secondary | ICD-10-CM | POA: Diagnosis not present

## 2017-09-11 DIAGNOSIS — S71112D Laceration without foreign body, left thigh, subsequent encounter: Secondary | ICD-10-CM | POA: Diagnosis not present

## 2017-09-11 DIAGNOSIS — Z4800 Encounter for change or removal of nonsurgical wound dressing: Secondary | ICD-10-CM | POA: Diagnosis not present

## 2017-09-11 DIAGNOSIS — N183 Chronic kidney disease, stage 3 (moderate): Secondary | ICD-10-CM | POA: Diagnosis not present

## 2017-09-11 DIAGNOSIS — D631 Anemia in chronic kidney disease: Secondary | ICD-10-CM | POA: Diagnosis not present

## 2017-09-11 DIAGNOSIS — I5032 Chronic diastolic (congestive) heart failure: Secondary | ICD-10-CM | POA: Diagnosis not present

## 2017-09-12 ENCOUNTER — Other Ambulatory Visit: Payer: Self-pay | Admitting: Cardiovascular Disease

## 2017-09-14 ENCOUNTER — Ambulatory Visit (INDEPENDENT_AMBULATORY_CARE_PROVIDER_SITE_OTHER): Payer: Medicare Other

## 2017-09-14 DIAGNOSIS — S71112D Laceration without foreign body, left thigh, subsequent encounter: Secondary | ICD-10-CM | POA: Diagnosis not present

## 2017-09-14 DIAGNOSIS — Z4800 Encounter for change or removal of nonsurgical wound dressing: Secondary | ICD-10-CM | POA: Diagnosis not present

## 2017-09-14 DIAGNOSIS — R55 Syncope and collapse: Secondary | ICD-10-CM

## 2017-09-14 DIAGNOSIS — L98492 Non-pressure chronic ulcer of skin of other sites with fat layer exposed: Secondary | ICD-10-CM | POA: Diagnosis not present

## 2017-09-14 DIAGNOSIS — L89152 Pressure ulcer of sacral region, stage 2: Secondary | ICD-10-CM | POA: Diagnosis not present

## 2017-09-14 DIAGNOSIS — J449 Chronic obstructive pulmonary disease, unspecified: Secondary | ICD-10-CM | POA: Diagnosis not present

## 2017-09-14 DIAGNOSIS — Z7902 Long term (current) use of antithrombotics/antiplatelets: Secondary | ICD-10-CM | POA: Diagnosis not present

## 2017-09-14 DIAGNOSIS — Z79891 Long term (current) use of opiate analgesic: Secondary | ICD-10-CM | POA: Diagnosis not present

## 2017-09-14 DIAGNOSIS — Z7982 Long term (current) use of aspirin: Secondary | ICD-10-CM | POA: Diagnosis not present

## 2017-09-14 DIAGNOSIS — L304 Erythema intertrigo: Secondary | ICD-10-CM | POA: Diagnosis not present

## 2017-09-14 DIAGNOSIS — Z79810 Long term (current) use of selective estrogen receptor modulators (SERMs): Secondary | ICD-10-CM | POA: Diagnosis not present

## 2017-09-14 DIAGNOSIS — N183 Chronic kidney disease, stage 3 (moderate): Secondary | ICD-10-CM | POA: Diagnosis not present

## 2017-09-14 DIAGNOSIS — D631 Anemia in chronic kidney disease: Secondary | ICD-10-CM | POA: Diagnosis not present

## 2017-09-14 DIAGNOSIS — I5032 Chronic diastolic (congestive) heart failure: Secondary | ICD-10-CM | POA: Diagnosis not present

## 2017-09-14 DIAGNOSIS — I130 Hypertensive heart and chronic kidney disease with heart failure and stage 1 through stage 4 chronic kidney disease, or unspecified chronic kidney disease: Secondary | ICD-10-CM | POA: Diagnosis not present

## 2017-09-14 DIAGNOSIS — E1122 Type 2 diabetes mellitus with diabetic chronic kidney disease: Secondary | ICD-10-CM | POA: Diagnosis not present

## 2017-09-14 DIAGNOSIS — Z9981 Dependence on supplemental oxygen: Secondary | ICD-10-CM | POA: Diagnosis not present

## 2017-09-14 DIAGNOSIS — Z794 Long term (current) use of insulin: Secondary | ICD-10-CM | POA: Diagnosis not present

## 2017-09-16 DIAGNOSIS — Z9981 Dependence on supplemental oxygen: Secondary | ICD-10-CM | POA: Diagnosis not present

## 2017-09-16 DIAGNOSIS — S71112D Laceration without foreign body, left thigh, subsequent encounter: Secondary | ICD-10-CM | POA: Diagnosis not present

## 2017-09-16 DIAGNOSIS — Z4800 Encounter for change or removal of nonsurgical wound dressing: Secondary | ICD-10-CM | POA: Diagnosis not present

## 2017-09-16 DIAGNOSIS — Z79891 Long term (current) use of opiate analgesic: Secondary | ICD-10-CM | POA: Diagnosis not present

## 2017-09-16 DIAGNOSIS — E1122 Type 2 diabetes mellitus with diabetic chronic kidney disease: Secondary | ICD-10-CM | POA: Diagnosis not present

## 2017-09-16 DIAGNOSIS — J449 Chronic obstructive pulmonary disease, unspecified: Secondary | ICD-10-CM | POA: Diagnosis not present

## 2017-09-16 DIAGNOSIS — L89152 Pressure ulcer of sacral region, stage 2: Secondary | ICD-10-CM | POA: Diagnosis not present

## 2017-09-16 DIAGNOSIS — L98492 Non-pressure chronic ulcer of skin of other sites with fat layer exposed: Secondary | ICD-10-CM | POA: Diagnosis not present

## 2017-09-16 DIAGNOSIS — I5032 Chronic diastolic (congestive) heart failure: Secondary | ICD-10-CM | POA: Diagnosis not present

## 2017-09-16 DIAGNOSIS — N183 Chronic kidney disease, stage 3 (moderate): Secondary | ICD-10-CM | POA: Diagnosis not present

## 2017-09-16 DIAGNOSIS — L304 Erythema intertrigo: Secondary | ICD-10-CM | POA: Diagnosis not present

## 2017-09-16 DIAGNOSIS — Z79810 Long term (current) use of selective estrogen receptor modulators (SERMs): Secondary | ICD-10-CM | POA: Diagnosis not present

## 2017-09-16 DIAGNOSIS — Z7902 Long term (current) use of antithrombotics/antiplatelets: Secondary | ICD-10-CM | POA: Diagnosis not present

## 2017-09-16 DIAGNOSIS — D631 Anemia in chronic kidney disease: Secondary | ICD-10-CM | POA: Diagnosis not present

## 2017-09-16 DIAGNOSIS — I130 Hypertensive heart and chronic kidney disease with heart failure and stage 1 through stage 4 chronic kidney disease, or unspecified chronic kidney disease: Secondary | ICD-10-CM | POA: Diagnosis not present

## 2017-09-16 DIAGNOSIS — Z794 Long term (current) use of insulin: Secondary | ICD-10-CM | POA: Diagnosis not present

## 2017-09-16 DIAGNOSIS — Z7982 Long term (current) use of aspirin: Secondary | ICD-10-CM | POA: Diagnosis not present

## 2017-09-17 DIAGNOSIS — Z794 Long term (current) use of insulin: Secondary | ICD-10-CM | POA: Diagnosis not present

## 2017-09-17 DIAGNOSIS — L89152 Pressure ulcer of sacral region, stage 2: Secondary | ICD-10-CM | POA: Diagnosis not present

## 2017-09-17 DIAGNOSIS — J449 Chronic obstructive pulmonary disease, unspecified: Secondary | ICD-10-CM | POA: Diagnosis not present

## 2017-09-17 DIAGNOSIS — E1122 Type 2 diabetes mellitus with diabetic chronic kidney disease: Secondary | ICD-10-CM | POA: Diagnosis not present

## 2017-09-17 DIAGNOSIS — D631 Anemia in chronic kidney disease: Secondary | ICD-10-CM | POA: Diagnosis not present

## 2017-09-17 DIAGNOSIS — N183 Chronic kidney disease, stage 3 (moderate): Secondary | ICD-10-CM | POA: Diagnosis not present

## 2017-09-17 DIAGNOSIS — Z79810 Long term (current) use of selective estrogen receptor modulators (SERMs): Secondary | ICD-10-CM | POA: Diagnosis not present

## 2017-09-17 DIAGNOSIS — S71112D Laceration without foreign body, left thigh, subsequent encounter: Secondary | ICD-10-CM | POA: Diagnosis not present

## 2017-09-17 DIAGNOSIS — Z79891 Long term (current) use of opiate analgesic: Secondary | ICD-10-CM | POA: Diagnosis not present

## 2017-09-17 DIAGNOSIS — I5032 Chronic diastolic (congestive) heart failure: Secondary | ICD-10-CM | POA: Diagnosis not present

## 2017-09-17 DIAGNOSIS — Z9981 Dependence on supplemental oxygen: Secondary | ICD-10-CM | POA: Diagnosis not present

## 2017-09-17 DIAGNOSIS — Z7982 Long term (current) use of aspirin: Secondary | ICD-10-CM | POA: Diagnosis not present

## 2017-09-17 DIAGNOSIS — L304 Erythema intertrigo: Secondary | ICD-10-CM | POA: Diagnosis not present

## 2017-09-17 DIAGNOSIS — Z4800 Encounter for change or removal of nonsurgical wound dressing: Secondary | ICD-10-CM | POA: Diagnosis not present

## 2017-09-17 DIAGNOSIS — I130 Hypertensive heart and chronic kidney disease with heart failure and stage 1 through stage 4 chronic kidney disease, or unspecified chronic kidney disease: Secondary | ICD-10-CM | POA: Diagnosis not present

## 2017-09-17 DIAGNOSIS — L98492 Non-pressure chronic ulcer of skin of other sites with fat layer exposed: Secondary | ICD-10-CM | POA: Diagnosis not present

## 2017-09-17 DIAGNOSIS — Z7902 Long term (current) use of antithrombotics/antiplatelets: Secondary | ICD-10-CM | POA: Diagnosis not present

## 2017-09-18 DIAGNOSIS — D631 Anemia in chronic kidney disease: Secondary | ICD-10-CM | POA: Diagnosis not present

## 2017-09-18 DIAGNOSIS — L89152 Pressure ulcer of sacral region, stage 2: Secondary | ICD-10-CM | POA: Diagnosis not present

## 2017-09-18 DIAGNOSIS — Z7982 Long term (current) use of aspirin: Secondary | ICD-10-CM | POA: Diagnosis not present

## 2017-09-18 DIAGNOSIS — J449 Chronic obstructive pulmonary disease, unspecified: Secondary | ICD-10-CM | POA: Diagnosis not present

## 2017-09-18 DIAGNOSIS — Z79891 Long term (current) use of opiate analgesic: Secondary | ICD-10-CM | POA: Diagnosis not present

## 2017-09-18 DIAGNOSIS — L304 Erythema intertrigo: Secondary | ICD-10-CM | POA: Diagnosis not present

## 2017-09-18 DIAGNOSIS — Z79810 Long term (current) use of selective estrogen receptor modulators (SERMs): Secondary | ICD-10-CM | POA: Diagnosis not present

## 2017-09-18 DIAGNOSIS — I5032 Chronic diastolic (congestive) heart failure: Secondary | ICD-10-CM | POA: Diagnosis not present

## 2017-09-18 DIAGNOSIS — Z9981 Dependence on supplemental oxygen: Secondary | ICD-10-CM | POA: Diagnosis not present

## 2017-09-18 DIAGNOSIS — E1122 Type 2 diabetes mellitus with diabetic chronic kidney disease: Secondary | ICD-10-CM | POA: Diagnosis not present

## 2017-09-18 DIAGNOSIS — Z4800 Encounter for change or removal of nonsurgical wound dressing: Secondary | ICD-10-CM | POA: Diagnosis not present

## 2017-09-18 DIAGNOSIS — L98492 Non-pressure chronic ulcer of skin of other sites with fat layer exposed: Secondary | ICD-10-CM | POA: Diagnosis not present

## 2017-09-18 DIAGNOSIS — S71112D Laceration without foreign body, left thigh, subsequent encounter: Secondary | ICD-10-CM | POA: Diagnosis not present

## 2017-09-18 DIAGNOSIS — Z7902 Long term (current) use of antithrombotics/antiplatelets: Secondary | ICD-10-CM | POA: Diagnosis not present

## 2017-09-18 DIAGNOSIS — N183 Chronic kidney disease, stage 3 (moderate): Secondary | ICD-10-CM | POA: Diagnosis not present

## 2017-09-18 DIAGNOSIS — Z794 Long term (current) use of insulin: Secondary | ICD-10-CM | POA: Diagnosis not present

## 2017-09-18 DIAGNOSIS — I130 Hypertensive heart and chronic kidney disease with heart failure and stage 1 through stage 4 chronic kidney disease, or unspecified chronic kidney disease: Secondary | ICD-10-CM | POA: Diagnosis not present

## 2017-09-21 DIAGNOSIS — L98492 Non-pressure chronic ulcer of skin of other sites with fat layer exposed: Secondary | ICD-10-CM | POA: Diagnosis not present

## 2017-09-21 DIAGNOSIS — D631 Anemia in chronic kidney disease: Secondary | ICD-10-CM | POA: Diagnosis not present

## 2017-09-21 DIAGNOSIS — I5032 Chronic diastolic (congestive) heart failure: Secondary | ICD-10-CM | POA: Diagnosis not present

## 2017-09-21 DIAGNOSIS — L89152 Pressure ulcer of sacral region, stage 2: Secondary | ICD-10-CM | POA: Diagnosis not present

## 2017-09-21 DIAGNOSIS — J449 Chronic obstructive pulmonary disease, unspecified: Secondary | ICD-10-CM | POA: Diagnosis not present

## 2017-09-21 DIAGNOSIS — Z4800 Encounter for change or removal of nonsurgical wound dressing: Secondary | ICD-10-CM | POA: Diagnosis not present

## 2017-09-21 DIAGNOSIS — Z79810 Long term (current) use of selective estrogen receptor modulators (SERMs): Secondary | ICD-10-CM | POA: Diagnosis not present

## 2017-09-21 DIAGNOSIS — Z794 Long term (current) use of insulin: Secondary | ICD-10-CM | POA: Diagnosis not present

## 2017-09-21 DIAGNOSIS — E1122 Type 2 diabetes mellitus with diabetic chronic kidney disease: Secondary | ICD-10-CM | POA: Diagnosis not present

## 2017-09-21 DIAGNOSIS — Z7982 Long term (current) use of aspirin: Secondary | ICD-10-CM | POA: Diagnosis not present

## 2017-09-21 DIAGNOSIS — L304 Erythema intertrigo: Secondary | ICD-10-CM | POA: Diagnosis not present

## 2017-09-21 DIAGNOSIS — I130 Hypertensive heart and chronic kidney disease with heart failure and stage 1 through stage 4 chronic kidney disease, or unspecified chronic kidney disease: Secondary | ICD-10-CM | POA: Diagnosis not present

## 2017-09-21 DIAGNOSIS — Z9981 Dependence on supplemental oxygen: Secondary | ICD-10-CM | POA: Diagnosis not present

## 2017-09-21 DIAGNOSIS — Z7902 Long term (current) use of antithrombotics/antiplatelets: Secondary | ICD-10-CM | POA: Diagnosis not present

## 2017-09-21 DIAGNOSIS — N183 Chronic kidney disease, stage 3 (moderate): Secondary | ICD-10-CM | POA: Diagnosis not present

## 2017-09-21 DIAGNOSIS — S71112D Laceration without foreign body, left thigh, subsequent encounter: Secondary | ICD-10-CM | POA: Diagnosis not present

## 2017-09-21 DIAGNOSIS — Z79891 Long term (current) use of opiate analgesic: Secondary | ICD-10-CM | POA: Diagnosis not present

## 2017-09-22 DIAGNOSIS — L98492 Non-pressure chronic ulcer of skin of other sites with fat layer exposed: Secondary | ICD-10-CM | POA: Diagnosis not present

## 2017-09-22 DIAGNOSIS — Z794 Long term (current) use of insulin: Secondary | ICD-10-CM | POA: Diagnosis not present

## 2017-09-22 DIAGNOSIS — I130 Hypertensive heart and chronic kidney disease with heart failure and stage 1 through stage 4 chronic kidney disease, or unspecified chronic kidney disease: Secondary | ICD-10-CM | POA: Diagnosis not present

## 2017-09-22 DIAGNOSIS — Z79810 Long term (current) use of selective estrogen receptor modulators (SERMs): Secondary | ICD-10-CM | POA: Diagnosis not present

## 2017-09-22 DIAGNOSIS — Z4800 Encounter for change or removal of nonsurgical wound dressing: Secondary | ICD-10-CM | POA: Diagnosis not present

## 2017-09-22 DIAGNOSIS — Z7982 Long term (current) use of aspirin: Secondary | ICD-10-CM | POA: Diagnosis not present

## 2017-09-22 DIAGNOSIS — Z7902 Long term (current) use of antithrombotics/antiplatelets: Secondary | ICD-10-CM | POA: Diagnosis not present

## 2017-09-22 DIAGNOSIS — E1122 Type 2 diabetes mellitus with diabetic chronic kidney disease: Secondary | ICD-10-CM | POA: Diagnosis not present

## 2017-09-22 DIAGNOSIS — Z79891 Long term (current) use of opiate analgesic: Secondary | ICD-10-CM | POA: Diagnosis not present

## 2017-09-22 DIAGNOSIS — N183 Chronic kidney disease, stage 3 (moderate): Secondary | ICD-10-CM | POA: Diagnosis not present

## 2017-09-22 DIAGNOSIS — I5032 Chronic diastolic (congestive) heart failure: Secondary | ICD-10-CM | POA: Diagnosis not present

## 2017-09-22 DIAGNOSIS — S71112D Laceration without foreign body, left thigh, subsequent encounter: Secondary | ICD-10-CM | POA: Diagnosis not present

## 2017-09-22 DIAGNOSIS — Z9981 Dependence on supplemental oxygen: Secondary | ICD-10-CM | POA: Diagnosis not present

## 2017-09-22 DIAGNOSIS — J449 Chronic obstructive pulmonary disease, unspecified: Secondary | ICD-10-CM | POA: Diagnosis not present

## 2017-09-22 DIAGNOSIS — L89152 Pressure ulcer of sacral region, stage 2: Secondary | ICD-10-CM | POA: Diagnosis not present

## 2017-09-22 DIAGNOSIS — L304 Erythema intertrigo: Secondary | ICD-10-CM | POA: Diagnosis not present

## 2017-09-22 DIAGNOSIS — D631 Anemia in chronic kidney disease: Secondary | ICD-10-CM | POA: Diagnosis not present

## 2017-09-23 DIAGNOSIS — I130 Hypertensive heart and chronic kidney disease with heart failure and stage 1 through stage 4 chronic kidney disease, or unspecified chronic kidney disease: Secondary | ICD-10-CM | POA: Diagnosis not present

## 2017-09-23 DIAGNOSIS — Z7982 Long term (current) use of aspirin: Secondary | ICD-10-CM | POA: Diagnosis not present

## 2017-09-23 DIAGNOSIS — J449 Chronic obstructive pulmonary disease, unspecified: Secondary | ICD-10-CM | POA: Diagnosis not present

## 2017-09-23 DIAGNOSIS — I5032 Chronic diastolic (congestive) heart failure: Secondary | ICD-10-CM | POA: Diagnosis not present

## 2017-09-23 DIAGNOSIS — L304 Erythema intertrigo: Secondary | ICD-10-CM | POA: Diagnosis not present

## 2017-09-23 DIAGNOSIS — Z4800 Encounter for change or removal of nonsurgical wound dressing: Secondary | ICD-10-CM | POA: Diagnosis not present

## 2017-09-23 DIAGNOSIS — N183 Chronic kidney disease, stage 3 (moderate): Secondary | ICD-10-CM | POA: Diagnosis not present

## 2017-09-23 DIAGNOSIS — L89152 Pressure ulcer of sacral region, stage 2: Secondary | ICD-10-CM | POA: Diagnosis not present

## 2017-09-23 DIAGNOSIS — Z9981 Dependence on supplemental oxygen: Secondary | ICD-10-CM | POA: Diagnosis not present

## 2017-09-23 DIAGNOSIS — L98492 Non-pressure chronic ulcer of skin of other sites with fat layer exposed: Secondary | ICD-10-CM | POA: Diagnosis not present

## 2017-09-23 DIAGNOSIS — Z794 Long term (current) use of insulin: Secondary | ICD-10-CM | POA: Diagnosis not present

## 2017-09-23 DIAGNOSIS — E1122 Type 2 diabetes mellitus with diabetic chronic kidney disease: Secondary | ICD-10-CM | POA: Diagnosis not present

## 2017-09-23 DIAGNOSIS — Z79810 Long term (current) use of selective estrogen receptor modulators (SERMs): Secondary | ICD-10-CM | POA: Diagnosis not present

## 2017-09-23 DIAGNOSIS — D631 Anemia in chronic kidney disease: Secondary | ICD-10-CM | POA: Diagnosis not present

## 2017-09-23 DIAGNOSIS — Z79891 Long term (current) use of opiate analgesic: Secondary | ICD-10-CM | POA: Diagnosis not present

## 2017-09-23 DIAGNOSIS — S71112D Laceration without foreign body, left thigh, subsequent encounter: Secondary | ICD-10-CM | POA: Diagnosis not present

## 2017-09-23 DIAGNOSIS — Z7902 Long term (current) use of antithrombotics/antiplatelets: Secondary | ICD-10-CM | POA: Diagnosis not present

## 2017-09-24 DIAGNOSIS — Z4800 Encounter for change or removal of nonsurgical wound dressing: Secondary | ICD-10-CM | POA: Diagnosis not present

## 2017-09-24 DIAGNOSIS — L89152 Pressure ulcer of sacral region, stage 2: Secondary | ICD-10-CM | POA: Diagnosis not present

## 2017-09-24 DIAGNOSIS — Z9981 Dependence on supplemental oxygen: Secondary | ICD-10-CM | POA: Diagnosis not present

## 2017-09-24 DIAGNOSIS — Z79891 Long term (current) use of opiate analgesic: Secondary | ICD-10-CM | POA: Diagnosis not present

## 2017-09-24 DIAGNOSIS — L304 Erythema intertrigo: Secondary | ICD-10-CM | POA: Diagnosis not present

## 2017-09-24 DIAGNOSIS — Z7982 Long term (current) use of aspirin: Secondary | ICD-10-CM | POA: Diagnosis not present

## 2017-09-24 DIAGNOSIS — D631 Anemia in chronic kidney disease: Secondary | ICD-10-CM | POA: Diagnosis not present

## 2017-09-24 DIAGNOSIS — E1122 Type 2 diabetes mellitus with diabetic chronic kidney disease: Secondary | ICD-10-CM | POA: Diagnosis not present

## 2017-09-24 DIAGNOSIS — Z79810 Long term (current) use of selective estrogen receptor modulators (SERMs): Secondary | ICD-10-CM | POA: Diagnosis not present

## 2017-09-24 DIAGNOSIS — S71112D Laceration without foreign body, left thigh, subsequent encounter: Secondary | ICD-10-CM | POA: Diagnosis not present

## 2017-09-24 DIAGNOSIS — Z794 Long term (current) use of insulin: Secondary | ICD-10-CM | POA: Diagnosis not present

## 2017-09-24 DIAGNOSIS — L98492 Non-pressure chronic ulcer of skin of other sites with fat layer exposed: Secondary | ICD-10-CM | POA: Diagnosis not present

## 2017-09-24 DIAGNOSIS — I5032 Chronic diastolic (congestive) heart failure: Secondary | ICD-10-CM | POA: Diagnosis not present

## 2017-09-24 DIAGNOSIS — I130 Hypertensive heart and chronic kidney disease with heart failure and stage 1 through stage 4 chronic kidney disease, or unspecified chronic kidney disease: Secondary | ICD-10-CM | POA: Diagnosis not present

## 2017-09-24 DIAGNOSIS — N183 Chronic kidney disease, stage 3 (moderate): Secondary | ICD-10-CM | POA: Diagnosis not present

## 2017-09-24 DIAGNOSIS — Z7902 Long term (current) use of antithrombotics/antiplatelets: Secondary | ICD-10-CM | POA: Diagnosis not present

## 2017-09-24 DIAGNOSIS — J449 Chronic obstructive pulmonary disease, unspecified: Secondary | ICD-10-CM | POA: Diagnosis not present

## 2017-09-25 DIAGNOSIS — J449 Chronic obstructive pulmonary disease, unspecified: Secondary | ICD-10-CM | POA: Diagnosis not present

## 2017-09-25 DIAGNOSIS — L89152 Pressure ulcer of sacral region, stage 2: Secondary | ICD-10-CM | POA: Diagnosis not present

## 2017-09-25 DIAGNOSIS — L98492 Non-pressure chronic ulcer of skin of other sites with fat layer exposed: Secondary | ICD-10-CM | POA: Diagnosis not present

## 2017-09-25 DIAGNOSIS — I5032 Chronic diastolic (congestive) heart failure: Secondary | ICD-10-CM | POA: Diagnosis not present

## 2017-09-25 DIAGNOSIS — Z4800 Encounter for change or removal of nonsurgical wound dressing: Secondary | ICD-10-CM | POA: Diagnosis not present

## 2017-09-25 DIAGNOSIS — Z7902 Long term (current) use of antithrombotics/antiplatelets: Secondary | ICD-10-CM | POA: Diagnosis not present

## 2017-09-25 DIAGNOSIS — Z794 Long term (current) use of insulin: Secondary | ICD-10-CM | POA: Diagnosis not present

## 2017-09-25 DIAGNOSIS — N183 Chronic kidney disease, stage 3 (moderate): Secondary | ICD-10-CM | POA: Diagnosis not present

## 2017-09-25 DIAGNOSIS — D631 Anemia in chronic kidney disease: Secondary | ICD-10-CM | POA: Diagnosis not present

## 2017-09-25 DIAGNOSIS — E1122 Type 2 diabetes mellitus with diabetic chronic kidney disease: Secondary | ICD-10-CM | POA: Diagnosis not present

## 2017-09-25 DIAGNOSIS — Z9981 Dependence on supplemental oxygen: Secondary | ICD-10-CM | POA: Diagnosis not present

## 2017-09-25 DIAGNOSIS — L304 Erythema intertrigo: Secondary | ICD-10-CM | POA: Diagnosis not present

## 2017-09-25 DIAGNOSIS — Z7982 Long term (current) use of aspirin: Secondary | ICD-10-CM | POA: Diagnosis not present

## 2017-09-25 DIAGNOSIS — E1165 Type 2 diabetes mellitus with hyperglycemia: Secondary | ICD-10-CM | POA: Diagnosis not present

## 2017-09-25 DIAGNOSIS — I130 Hypertensive heart and chronic kidney disease with heart failure and stage 1 through stage 4 chronic kidney disease, or unspecified chronic kidney disease: Secondary | ICD-10-CM | POA: Diagnosis not present

## 2017-09-25 DIAGNOSIS — S71112D Laceration without foreign body, left thigh, subsequent encounter: Secondary | ICD-10-CM | POA: Diagnosis not present

## 2017-09-25 DIAGNOSIS — Z79891 Long term (current) use of opiate analgesic: Secondary | ICD-10-CM | POA: Diagnosis not present

## 2017-09-28 DIAGNOSIS — Z4800 Encounter for change or removal of nonsurgical wound dressing: Secondary | ICD-10-CM | POA: Diagnosis not present

## 2017-09-28 DIAGNOSIS — Z7982 Long term (current) use of aspirin: Secondary | ICD-10-CM | POA: Diagnosis not present

## 2017-09-28 DIAGNOSIS — L89152 Pressure ulcer of sacral region, stage 2: Secondary | ICD-10-CM | POA: Diagnosis not present

## 2017-09-28 DIAGNOSIS — J449 Chronic obstructive pulmonary disease, unspecified: Secondary | ICD-10-CM | POA: Diagnosis not present

## 2017-09-28 DIAGNOSIS — D631 Anemia in chronic kidney disease: Secondary | ICD-10-CM | POA: Diagnosis not present

## 2017-09-28 DIAGNOSIS — E1122 Type 2 diabetes mellitus with diabetic chronic kidney disease: Secondary | ICD-10-CM | POA: Diagnosis not present

## 2017-09-28 DIAGNOSIS — N183 Chronic kidney disease, stage 3 (moderate): Secondary | ICD-10-CM | POA: Diagnosis not present

## 2017-09-28 DIAGNOSIS — Z79810 Long term (current) use of selective estrogen receptor modulators (SERMs): Secondary | ICD-10-CM | POA: Diagnosis not present

## 2017-09-28 DIAGNOSIS — Z79891 Long term (current) use of opiate analgesic: Secondary | ICD-10-CM | POA: Diagnosis not present

## 2017-09-28 DIAGNOSIS — I130 Hypertensive heart and chronic kidney disease with heart failure and stage 1 through stage 4 chronic kidney disease, or unspecified chronic kidney disease: Secondary | ICD-10-CM | POA: Diagnosis not present

## 2017-09-28 DIAGNOSIS — Z7902 Long term (current) use of antithrombotics/antiplatelets: Secondary | ICD-10-CM | POA: Diagnosis not present

## 2017-09-28 DIAGNOSIS — I5032 Chronic diastolic (congestive) heart failure: Secondary | ICD-10-CM | POA: Diagnosis not present

## 2017-09-28 DIAGNOSIS — Z794 Long term (current) use of insulin: Secondary | ICD-10-CM | POA: Diagnosis not present

## 2017-09-28 DIAGNOSIS — Z9981 Dependence on supplemental oxygen: Secondary | ICD-10-CM | POA: Diagnosis not present

## 2017-09-28 DIAGNOSIS — S71112D Laceration without foreign body, left thigh, subsequent encounter: Secondary | ICD-10-CM | POA: Diagnosis not present

## 2017-09-28 DIAGNOSIS — L304 Erythema intertrigo: Secondary | ICD-10-CM | POA: Diagnosis not present

## 2017-09-28 DIAGNOSIS — L98492 Non-pressure chronic ulcer of skin of other sites with fat layer exposed: Secondary | ICD-10-CM | POA: Diagnosis not present

## 2017-09-30 DIAGNOSIS — N183 Chronic kidney disease, stage 3 (moderate): Secondary | ICD-10-CM | POA: Diagnosis not present

## 2017-09-30 DIAGNOSIS — E1122 Type 2 diabetes mellitus with diabetic chronic kidney disease: Secondary | ICD-10-CM | POA: Diagnosis not present

## 2017-09-30 DIAGNOSIS — L98492 Non-pressure chronic ulcer of skin of other sites with fat layer exposed: Secondary | ICD-10-CM | POA: Diagnosis not present

## 2017-09-30 DIAGNOSIS — L304 Erythema intertrigo: Secondary | ICD-10-CM | POA: Diagnosis not present

## 2017-09-30 DIAGNOSIS — S71112D Laceration without foreign body, left thigh, subsequent encounter: Secondary | ICD-10-CM | POA: Diagnosis not present

## 2017-09-30 DIAGNOSIS — I5032 Chronic diastolic (congestive) heart failure: Secondary | ICD-10-CM | POA: Diagnosis not present

## 2017-09-30 DIAGNOSIS — I130 Hypertensive heart and chronic kidney disease with heart failure and stage 1 through stage 4 chronic kidney disease, or unspecified chronic kidney disease: Secondary | ICD-10-CM | POA: Diagnosis not present

## 2017-09-30 DIAGNOSIS — Z9981 Dependence on supplemental oxygen: Secondary | ICD-10-CM | POA: Diagnosis not present

## 2017-09-30 DIAGNOSIS — Z7902 Long term (current) use of antithrombotics/antiplatelets: Secondary | ICD-10-CM | POA: Diagnosis not present

## 2017-09-30 DIAGNOSIS — J449 Chronic obstructive pulmonary disease, unspecified: Secondary | ICD-10-CM | POA: Diagnosis not present

## 2017-09-30 DIAGNOSIS — D631 Anemia in chronic kidney disease: Secondary | ICD-10-CM | POA: Diagnosis not present

## 2017-09-30 DIAGNOSIS — L89152 Pressure ulcer of sacral region, stage 2: Secondary | ICD-10-CM | POA: Diagnosis not present

## 2017-09-30 DIAGNOSIS — Z79810 Long term (current) use of selective estrogen receptor modulators (SERMs): Secondary | ICD-10-CM | POA: Diagnosis not present

## 2017-09-30 DIAGNOSIS — Z7982 Long term (current) use of aspirin: Secondary | ICD-10-CM | POA: Diagnosis not present

## 2017-09-30 DIAGNOSIS — Z4800 Encounter for change or removal of nonsurgical wound dressing: Secondary | ICD-10-CM | POA: Diagnosis not present

## 2017-09-30 DIAGNOSIS — Z794 Long term (current) use of insulin: Secondary | ICD-10-CM | POA: Diagnosis not present

## 2017-09-30 DIAGNOSIS — Z79891 Long term (current) use of opiate analgesic: Secondary | ICD-10-CM | POA: Diagnosis not present

## 2017-10-01 DIAGNOSIS — Z79810 Long term (current) use of selective estrogen receptor modulators (SERMs): Secondary | ICD-10-CM | POA: Diagnosis not present

## 2017-10-01 DIAGNOSIS — Z79891 Long term (current) use of opiate analgesic: Secondary | ICD-10-CM | POA: Diagnosis not present

## 2017-10-01 DIAGNOSIS — D631 Anemia in chronic kidney disease: Secondary | ICD-10-CM | POA: Diagnosis not present

## 2017-10-01 DIAGNOSIS — I5032 Chronic diastolic (congestive) heart failure: Secondary | ICD-10-CM | POA: Diagnosis not present

## 2017-10-01 DIAGNOSIS — J449 Chronic obstructive pulmonary disease, unspecified: Secondary | ICD-10-CM | POA: Diagnosis not present

## 2017-10-01 DIAGNOSIS — N183 Chronic kidney disease, stage 3 (moderate): Secondary | ICD-10-CM | POA: Diagnosis not present

## 2017-10-01 DIAGNOSIS — Z7982 Long term (current) use of aspirin: Secondary | ICD-10-CM | POA: Diagnosis not present

## 2017-10-01 DIAGNOSIS — L89152 Pressure ulcer of sacral region, stage 2: Secondary | ICD-10-CM | POA: Diagnosis not present

## 2017-10-01 DIAGNOSIS — S71112D Laceration without foreign body, left thigh, subsequent encounter: Secondary | ICD-10-CM | POA: Diagnosis not present

## 2017-10-01 DIAGNOSIS — Z9981 Dependence on supplemental oxygen: Secondary | ICD-10-CM | POA: Diagnosis not present

## 2017-10-01 DIAGNOSIS — L304 Erythema intertrigo: Secondary | ICD-10-CM | POA: Diagnosis not present

## 2017-10-01 DIAGNOSIS — Z794 Long term (current) use of insulin: Secondary | ICD-10-CM | POA: Diagnosis not present

## 2017-10-01 DIAGNOSIS — L98492 Non-pressure chronic ulcer of skin of other sites with fat layer exposed: Secondary | ICD-10-CM | POA: Diagnosis not present

## 2017-10-01 DIAGNOSIS — Z4800 Encounter for change or removal of nonsurgical wound dressing: Secondary | ICD-10-CM | POA: Diagnosis not present

## 2017-10-01 DIAGNOSIS — E1122 Type 2 diabetes mellitus with diabetic chronic kidney disease: Secondary | ICD-10-CM | POA: Diagnosis not present

## 2017-10-01 DIAGNOSIS — Z7902 Long term (current) use of antithrombotics/antiplatelets: Secondary | ICD-10-CM | POA: Diagnosis not present

## 2017-10-01 DIAGNOSIS — I130 Hypertensive heart and chronic kidney disease with heart failure and stage 1 through stage 4 chronic kidney disease, or unspecified chronic kidney disease: Secondary | ICD-10-CM | POA: Diagnosis not present

## 2017-10-02 DIAGNOSIS — J449 Chronic obstructive pulmonary disease, unspecified: Secondary | ICD-10-CM | POA: Diagnosis not present

## 2017-10-02 DIAGNOSIS — Z7982 Long term (current) use of aspirin: Secondary | ICD-10-CM | POA: Diagnosis not present

## 2017-10-02 DIAGNOSIS — D631 Anemia in chronic kidney disease: Secondary | ICD-10-CM | POA: Diagnosis not present

## 2017-10-02 DIAGNOSIS — Z7902 Long term (current) use of antithrombotics/antiplatelets: Secondary | ICD-10-CM | POA: Diagnosis not present

## 2017-10-02 DIAGNOSIS — Z4800 Encounter for change or removal of nonsurgical wound dressing: Secondary | ICD-10-CM | POA: Diagnosis not present

## 2017-10-02 DIAGNOSIS — I5032 Chronic diastolic (congestive) heart failure: Secondary | ICD-10-CM | POA: Diagnosis not present

## 2017-10-02 DIAGNOSIS — Z79810 Long term (current) use of selective estrogen receptor modulators (SERMs): Secondary | ICD-10-CM | POA: Diagnosis not present

## 2017-10-02 DIAGNOSIS — I130 Hypertensive heart and chronic kidney disease with heart failure and stage 1 through stage 4 chronic kidney disease, or unspecified chronic kidney disease: Secondary | ICD-10-CM | POA: Diagnosis not present

## 2017-10-02 DIAGNOSIS — E1122 Type 2 diabetes mellitus with diabetic chronic kidney disease: Secondary | ICD-10-CM | POA: Diagnosis not present

## 2017-10-02 DIAGNOSIS — Z794 Long term (current) use of insulin: Secondary | ICD-10-CM | POA: Diagnosis not present

## 2017-10-02 DIAGNOSIS — Z9981 Dependence on supplemental oxygen: Secondary | ICD-10-CM | POA: Diagnosis not present

## 2017-10-02 DIAGNOSIS — N183 Chronic kidney disease, stage 3 (moderate): Secondary | ICD-10-CM | POA: Diagnosis not present

## 2017-10-02 DIAGNOSIS — L98492 Non-pressure chronic ulcer of skin of other sites with fat layer exposed: Secondary | ICD-10-CM | POA: Diagnosis not present

## 2017-10-02 DIAGNOSIS — S71112D Laceration without foreign body, left thigh, subsequent encounter: Secondary | ICD-10-CM | POA: Diagnosis not present

## 2017-10-02 DIAGNOSIS — Z79891 Long term (current) use of opiate analgesic: Secondary | ICD-10-CM | POA: Diagnosis not present

## 2017-10-02 DIAGNOSIS — L304 Erythema intertrigo: Secondary | ICD-10-CM | POA: Diagnosis not present

## 2017-10-02 DIAGNOSIS — L89152 Pressure ulcer of sacral region, stage 2: Secondary | ICD-10-CM | POA: Diagnosis not present

## 2017-10-09 DIAGNOSIS — M545 Low back pain: Secondary | ICD-10-CM | POA: Diagnosis not present

## 2017-10-09 DIAGNOSIS — G894 Chronic pain syndrome: Secondary | ICD-10-CM | POA: Diagnosis not present

## 2017-10-09 DIAGNOSIS — Z79891 Long term (current) use of opiate analgesic: Secondary | ICD-10-CM | POA: Diagnosis not present

## 2017-10-13 ENCOUNTER — Encounter (HOSPITAL_COMMUNITY): Payer: Self-pay | Admitting: Emergency Medicine

## 2017-10-13 ENCOUNTER — Other Ambulatory Visit: Payer: Self-pay

## 2017-10-13 ENCOUNTER — Emergency Department (HOSPITAL_COMMUNITY): Payer: Medicare HMO

## 2017-10-13 ENCOUNTER — Inpatient Hospital Stay (HOSPITAL_COMMUNITY)
Admission: EM | Admit: 2017-10-13 | Discharge: 2017-10-16 | DRG: 312 | Disposition: A | Discharge status: Home Health | Payer: Medicare HMO | Attending: Family Medicine | Admitting: Family Medicine

## 2017-10-13 DIAGNOSIS — G8929 Other chronic pain: Secondary | ICD-10-CM | POA: Diagnosis present

## 2017-10-13 DIAGNOSIS — Z79891 Long term (current) use of opiate analgesic: Secondary | ICD-10-CM | POA: Diagnosis not present

## 2017-10-13 DIAGNOSIS — I25119 Atherosclerotic heart disease of native coronary artery with unspecified angina pectoris: Secondary | ICD-10-CM | POA: Diagnosis not present

## 2017-10-13 DIAGNOSIS — Z7902 Long term (current) use of antithrombotics/antiplatelets: Secondary | ICD-10-CM

## 2017-10-13 DIAGNOSIS — Z6841 Body Mass Index (BMI) 40.0 and over, adult: Secondary | ICD-10-CM

## 2017-10-13 DIAGNOSIS — R42 Dizziness and giddiness: Secondary | ICD-10-CM

## 2017-10-13 DIAGNOSIS — I5032 Chronic diastolic (congestive) heart failure: Secondary | ICD-10-CM | POA: Diagnosis present

## 2017-10-13 DIAGNOSIS — E039 Hypothyroidism, unspecified: Secondary | ICD-10-CM | POA: Diagnosis present

## 2017-10-13 DIAGNOSIS — N183 Chronic kidney disease, stage 3 unspecified: Secondary | ICD-10-CM | POA: Diagnosis present

## 2017-10-13 DIAGNOSIS — I130 Hypertensive heart and chronic kidney disease with heart failure and stage 1 through stage 4 chronic kidney disease, or unspecified chronic kidney disease: Secondary | ICD-10-CM | POA: Diagnosis present

## 2017-10-13 DIAGNOSIS — I951 Orthostatic hypotension: Secondary | ICD-10-CM | POA: Diagnosis present

## 2017-10-13 DIAGNOSIS — Z9851 Tubal ligation status: Secondary | ICD-10-CM

## 2017-10-13 DIAGNOSIS — I10 Essential (primary) hypertension: Secondary | ICD-10-CM | POA: Diagnosis present

## 2017-10-13 DIAGNOSIS — R55 Syncope and collapse: Secondary | ICD-10-CM | POA: Diagnosis present

## 2017-10-13 DIAGNOSIS — Z66 Do not resuscitate: Secondary | ICD-10-CM | POA: Diagnosis present

## 2017-10-13 DIAGNOSIS — Z7982 Long term (current) use of aspirin: Secondary | ICD-10-CM

## 2017-10-13 DIAGNOSIS — Z880 Allergy status to penicillin: Secondary | ICD-10-CM

## 2017-10-13 DIAGNOSIS — M545 Low back pain, unspecified: Secondary | ICD-10-CM

## 2017-10-13 DIAGNOSIS — E032 Hypothyroidism due to medicaments and other exogenous substances: Secondary | ICD-10-CM | POA: Diagnosis not present

## 2017-10-13 DIAGNOSIS — R0789 Other chest pain: Secondary | ICD-10-CM | POA: Diagnosis not present

## 2017-10-13 DIAGNOSIS — E1165 Type 2 diabetes mellitus with hyperglycemia: Secondary | ICD-10-CM | POA: Diagnosis present

## 2017-10-13 DIAGNOSIS — I25118 Atherosclerotic heart disease of native coronary artery with other forms of angina pectoris: Secondary | ICD-10-CM | POA: Diagnosis not present

## 2017-10-13 DIAGNOSIS — E1122 Type 2 diabetes mellitus with diabetic chronic kidney disease: Secondary | ICD-10-CM | POA: Diagnosis present

## 2017-10-13 DIAGNOSIS — E785 Hyperlipidemia, unspecified: Secondary | ICD-10-CM | POA: Diagnosis present

## 2017-10-13 DIAGNOSIS — N179 Acute kidney failure, unspecified: Secondary | ICD-10-CM | POA: Diagnosis present

## 2017-10-13 DIAGNOSIS — Z9981 Dependence on supplemental oxygen: Secondary | ICD-10-CM | POA: Diagnosis not present

## 2017-10-13 DIAGNOSIS — R001 Bradycardia, unspecified: Secondary | ICD-10-CM | POA: Diagnosis present

## 2017-10-13 DIAGNOSIS — R079 Chest pain, unspecified: Secondary | ICD-10-CM | POA: Diagnosis not present

## 2017-10-13 DIAGNOSIS — Z833 Family history of diabetes mellitus: Secondary | ICD-10-CM

## 2017-10-13 DIAGNOSIS — I2510 Atherosclerotic heart disease of native coronary artery without angina pectoris: Secondary | ICD-10-CM | POA: Diagnosis present

## 2017-10-13 DIAGNOSIS — J449 Chronic obstructive pulmonary disease, unspecified: Secondary | ICD-10-CM | POA: Diagnosis present

## 2017-10-13 DIAGNOSIS — Z79890 Hormone replacement therapy (postmenopausal): Secondary | ICD-10-CM | POA: Diagnosis not present

## 2017-10-13 DIAGNOSIS — Z87891 Personal history of nicotine dependence: Secondary | ICD-10-CM

## 2017-10-13 DIAGNOSIS — Z79899 Other long term (current) drug therapy: Secondary | ICD-10-CM | POA: Diagnosis not present

## 2017-10-13 DIAGNOSIS — Z885 Allergy status to narcotic agent status: Secondary | ICD-10-CM

## 2017-10-13 DIAGNOSIS — IMO0002 Reserved for concepts with insufficient information to code with codable children: Secondary | ICD-10-CM | POA: Diagnosis present

## 2017-10-13 DIAGNOSIS — Z8249 Family history of ischemic heart disease and other diseases of the circulatory system: Secondary | ICD-10-CM

## 2017-10-13 DIAGNOSIS — Z955 Presence of coronary angioplasty implant and graft: Secondary | ICD-10-CM | POA: Diagnosis not present

## 2017-10-13 DIAGNOSIS — Z794 Long term (current) use of insulin: Secondary | ICD-10-CM | POA: Diagnosis not present

## 2017-10-13 DIAGNOSIS — Z888 Allergy status to other drugs, medicaments and biological substances status: Secondary | ICD-10-CM

## 2017-10-13 LAB — BASIC METABOLIC PANEL
Anion gap: 7 (ref 5–15)
BUN: 33 mg/dL — ABNORMAL HIGH (ref 6–20)
CO2: 27 mmol/L (ref 22–32)
Calcium: 8.9 mg/dL (ref 8.9–10.3)
Chloride: 102 mmol/L (ref 101–111)
Creatinine, Ser: 2.09 mg/dL — ABNORMAL HIGH (ref 0.44–1.00)
GFR calc Af Amer: 27 mL/min — ABNORMAL LOW (ref >60)
GFR calc non Af Amer: 23 mL/min — ABNORMAL LOW (ref >60)
Glucose, Bld: 90 mg/dL (ref 65–99)
Potassium: 4.6 mmol/L (ref 3.5–5.1)
Sodium: 136 mmol/L (ref 135–145)

## 2017-10-13 LAB — BLOOD GAS, VENOUS
Acid-base deficit: 0.4 mmol/L (ref 0.0–2.0)
Bicarbonate: 22.9 mmol/L (ref 20.0–28.0)
O2 Saturation: 72.6 %
Patient temperature: 37
pCO2, Ven: 56.7 mmHg (ref 44.0–60.0)
pH, Ven: 7.277 (ref 7.250–7.430)
pO2, Ven: 44.5 mmHg (ref 32.0–45.0)

## 2017-10-13 LAB — CBC
HCT: 33.7 % — ABNORMAL LOW (ref 36.0–46.0)
Hemoglobin: 10.2 g/dL — ABNORMAL LOW (ref 12.0–15.0)
MCH: 27.7 pg (ref 26.0–34.0)
MCHC: 30.3 g/dL (ref 30.0–36.0)
MCV: 91.6 fL (ref 78.0–100.0)
Platelets: 256 10*3/uL (ref 150–400)
RBC: 3.68 MIL/uL — ABNORMAL LOW (ref 3.87–5.11)
RDW: 15.2 % (ref 11.5–15.5)
WBC: 8.1 10*3/uL (ref 4.0–10.5)

## 2017-10-13 LAB — LACTIC ACID, PLASMA
Lactic Acid, Venous: 1.5 mmol/L (ref 0.5–1.9)
Lactic Acid, Venous: 1 mmol/L (ref 0.5–1.9)

## 2017-10-13 LAB — TSH: TSH: 199.456 u[IU]/mL — ABNORMAL HIGH (ref 0.350–4.500)

## 2017-10-13 LAB — CK: Total CK: 162 U/L (ref 38–234)

## 2017-10-13 LAB — TROPONIN I: Troponin I: <0.03 ng/mL (ref <0.03)

## 2017-10-13 LAB — CBG MONITORING, ED: Glucose-Capillary: 105 mg/dL — ABNORMAL HIGH (ref 65–99)

## 2017-10-13 MED ORDER — SODIUM CHLORIDE 0.9 % IV BOLUS
1000.0000 mL | Freq: Once | INTRAVENOUS | Status: AC
  Administered 2017-10-13: 1000 mL via INTRAVENOUS

## 2017-10-13 MED ORDER — LEVOTHYROXINE SODIUM 75 MCG PO TABS
75.0000 ug | ORAL_TABLET | Freq: Once | ORAL | Status: AC
  Administered 2017-10-14: 75 ug via ORAL
  Filled 2017-10-13: qty 1

## 2017-10-13 NOTE — ED Notes (Signed)
Checked with pt for urine sample,pt tried no luck,will try back in 30 minutes.

## 2017-10-13 NOTE — H&P (Signed)
TRH H&P    Patient Demographics:    Stephanie Combs, is a 67 y.o. female  MRN: [id number]  DOB - [birth year]  Admit Date - 10/13/2017  Referring MD/NP/PA: Dr. Melina Copa  Outpatient Primary MD for the patient is [name], MD  Patient coming from: Home  Chief complaint-passed out   HPI:    Stephanie Combs  is a 67 y.o. female, with history of coronary artery disease, recent cardiac catheter in January 2019 showed proximal LAD lesion of 95% stenosis and 80% stenosed sidebranch in OST first diagonal, drug-eluting stent with balloon angioplasty of diagonal branch on dual antiplatelet therapy, chronic diastolic heart failure, hypertension, dyslipidemia, chronic kidney disease stage III, type 2 diabetes mellitus, hypothyroidism, syncope, morbid obesity, anemia who came to hospital after patient passed out at home.  Patient says that she was sitting in chair and was reaching over to get something off the floor and then does not recall anything after that.  Patient says that she was on the floor for unknown duration of time and she was very tired and went back to sleep after that.    Patient had syncopal episode in previous admission and was discharged home with Holter monitor.  Patient is currently wearing Holter monitor.  Patient's blood pressure was low at home per EMS.  Her heart rate was in 50s, blood pressure was 80/42.  No history of tongue biting, no bladder or bowel incontinence.  This was unwitnessed episode.  Patient has hypothyroidism, lab work in the ED showed TSH of 199.456.  Patients dose of Synthroid was changed from 224 mcg to  25 mcg due to chronically suppressed TSH.  Patient says that she has been feeling tired for past 1 month.  Dose of metoprolol also was changed by her cardiologist to 25 mg twice a day.  She denies chest pain, no shortness of breath. No nausea vomiting or diarrhea. No fever or  chills. Denies dysuria.    Review of systems:      All other systems reviewed and are negative.   With Past History of the following :    Past Medical History:  Diagnosis Date  . Anxiety   . CAD (coronary artery disease)   . CHF (congestive heart failure) (HCC)    Diastolic  . Chronic back pain   . COPD (chronic obstructive pulmonary disease) ([place])   . Degenerative disk disease   . Diabetes mellitus without complication ([place])   . Hypertension   . Hypothyroidism   . On home O2    2.5 L N/C prn  . Pedal edema       Past Surgical History:  Procedure Laterality Date  . CORONARY STENT INTERVENTION N/A 05/22/2017   Procedure: CORONARY STENT INTERVENTION;  Surgeon: Martinique, Peter M, MD;  Location: [facility];  Service: Cardiovascular;  Laterality: N/A;  . HERNIA REPAIR    . RIGHT/LEFT HEART CATH AND CORONARY ANGIOGRAPHY N/A 05/19/2017   Procedure: RIGHT/LEFT HEART CATH AND CORONARY ANGIOGRAPHY;  Surgeon: Leonie Man, MD;  Location: [facility];  Service: Cardiovascular;  Laterality: N/A;  . TUBAL LIGATION        Social History:      Social History   Tobacco Use  . Smoking status: Former Smoker    Packs/day: 0.25    Types: Cigarettes    Last attempt to quit: 06/27/2015    Years since quitting: 2.2  . Smokeless tobacco: Never Used  Substance Use Topics  . Alcohol use: No    Alcohol/week: 0.0 oz       Family History :     Family History  Problem Relation Age of Onset  . Stroke Mother   . Heart attack Father   . Diabetes Brother   . Cerebral palsy Brother   . Pneumonia Brother   . Diabetes Other   . Heart attack Other       Home Medications:   Prior to Admission medications   Medication Sig Start Date End Date Taking? Authorizing Provider  albuterol (PROAIR HFA) 108 (90 BASE) MCG/ACT inhaler Inhale 1 puff into the lungs every 4 (four) hours as needed. For shortness of breath Patient taking differently: Inhale 2 puffs into the lungs  every 4 (four) hours as needed. For shortness of breath 05/30/13   Kinnie Feil, MD  ALPRAZolam Duanne Moron) 1 MG tablet Take 1 mg by mouth 3 (three) times daily as needed for anxiety or sleep.  01/05/17   [provider]  aspirin EC 81 MG tablet Take 81 mg by mouth daily.    [provider]  atorvastatin (LIPITOR) 80 MG tablet Take 1 tablet (80 mg total) by mouth daily at 6 PM. 05/26/17   [name], MD  Cholecalciferol (VITAMIN D3) 5000 units CAPS Take 1 capsule (5,000 Units total) by mouth daily. 08/19/16   Cassandria Anger, MD  clopidogrel (PLAVIX) 75 MG tablet Take 1 tablet (75 mg total) by mouth daily. 05/26/17   [name], MD  ferrous sulfate 325 (65 FE) MG tablet Take 1 tablet (325 mg total) by mouth daily with breakfast. 02/05/17   [name], MD  furosemide (LASIX) 40 MG tablet Take 0.5 tablets (20 mg total) by mouth daily. 09/04/17   Dhungel, Flonnie Overman, MD  gabapentin (NEURONTIN) 300 MG capsule Take 1 capsule (300 mg total) by mouth 3 (three) times daily. 02/05/17   [name], MD  Hydrocortisone (GERHARDT'S BUTT CREAM) CREA Apply 1 application topically 3 (three) times daily. 05/26/17   [name], MD  insulin aspart (NOVOLOG) 100 UNIT/ML injection Inject 0-9 Units into the skin 3 (three) times daily with meals. Patient taking differently: Inject 1-18 Units into the skin 3 (three) times daily with meals. Per sliding scale 05/26/17   [name], MD  insulin detemir (LEVEMIR) 100 UNIT/ML injection Inject 0.52 mLs (52 Units total) into the skin at bedtime. 05/26/17   [name], MD  levothyroxine (SYNTHROID, LEVOTHROID) 25 MCG tablet Take 1 tablet (25 mcg total) by mouth daily at 6 (six) AM. 09/05/17   Dhungel, Flonnie Overman, MD  metoprolol tartrate (LOPRESSOR) 50 MG tablet Take 0.5 tablets (25 mg total) by mouth 2 (two) times daily. 09/04/17   Dhungel, Flonnie Overman, MD  nystatin (MYCOSTATIN/NYSTOP) powder Apply topically 2 (two) times daily. 10/10/16   Eber Jones, MD  Omega-3 Fatty  Acids (FISH OIL PO) Take 1 capsule by mouth daily.     [provider]  ondansetron (ZOFRAN) 4 MG tablet Take 4 mg by mouth every 8 (eight) hours as needed for vomiting.  01/01/17  [provider]  oxyCODONE-acetaminophen (PERCOCET) 10-325 MG tablet Take 1 tablet by mouth every 6 (six) hours as needed for pain.    [provider]  pantoprazole (PROTONIX) 40 MG tablet Take 1 tablet (40 mg total) by mouth daily. 02/05/17   [name], MD  polyethylene glycol [facility] / Floria Raveling) packet Take 17 g by mouth daily as needed for mild constipation or moderate constipation. 05/26/17   [name], MD  silver sulfADIAZINE (SILVADENE) 1 % cream Apply 1 application topically daily.    [provider]  SURE COMFORT INS SYR 1CC/28G 28G X 1/2" 1 ML MISC USE TO INJECT INSULIN UP TO 4 TIMES DAILY. 03/06/17   Cassandria Anger, MD  tiZANidine (ZANAFLEX) 4 MG tablet Take 4 mg by mouth every 8 (eight) hours as needed for muscle spasms.  01/10/17   [provider]  topiramate (TOPAMAX) 25 MG tablet Take 75 mg by mouth daily.  01/02/17   [provider]  triamcinolone cream (KENALOG) 0.1 % Apply 1 application topically 4 (four) times daily.  01/24/17   [provider]  umeclidinium-vilanterol (ANORO ELLIPTA) 62.5-25 MCG/INH AEPB Inhale 1 puff into the lungs daily. 05/26/17   [name], MD  VICTOZA 18 MG/3ML SOPN Inject 1.2 mg into the skin daily.  01/01/17   [provider]     Allergies:     Allergies  Allergen Reactions  . Dilaudid [Hydromorphone Hcl] Itching  . Actifed Cold-Allergy [Chlorpheniramine-Phenylephrine]     "I was sick and red and it didn't agree with me at all"  . Doxycycline Nausea And Vomiting  . Other Cough    Pt states she is allergic to ragweed and that she starts coughing and sneezing like crazy  . Penicillins Hives and Itching    Tolerates Rocephin Has patient had a PCN reaction causing immediate rash, facial/tongue/throat  swelling, SOB or lightheadedness with hypotension: Yes Has patient had a PCN reaction causing severe rash involving mucus membranes or skin necrosis: No Has patient had a PCN reaction that required hospitalization Yes Has patient had a PCN reaction occurring within the last 10 years: No If all of the above answers are "NO", then may proceed with Cephalosporin use.   . Reglan [Metoclopramide] Itching  . Valium [Diazepam] Itching  . Vistaril [Hydroxyzine Hcl] Itching     Physical Exam:   Vitals  Blood pressure (!) 150/73, pulse (!) 44, temperature (!) 97.5 F (36.4 C), temperature source Oral, resp. rate 16, height 5\' 6"  (1.676 m), weight 117.5 kg (259 lb), SpO2 100 %.  1.  General: Appears in no acute distress  2. Psychiatric:  Intact judgement and  insight, awake alert, oriented x 3.  3. Neurologic: No focal neurological deficits, all cranial nerves intact.Strength 5/5 all 4 extremities, sensation intact all 4 extremities, plantars down going.  4. Eyes :  anicteric sclerae, moist conjunctivae with no lid lag. PERRLA.  5. ENMT:  Oropharynx clear with moist mucous membranes and good dentition  6. Neck:  supple, no cervical lymphadenopathy appriciated, No thyromegaly  7. Respiratory : Normal respiratory effort, good air movement bilaterally,clear to  auscultation bilaterally  8. Cardiovascular : RRR, no gallops, rubs or murmurs, trace edema of the lower extremities  9. Gastrointestinal:  Positive bowel sounds, abdomen soft, non-tender to palpation,no hepatosplenomegaly, no rigidity or guarding       10. Skin:  No cyanosis, normal texture and turgor, no rash, lesions or ulcers  11.Musculoskeletal:  Good muscle tone,  joints appear  normal , no effusions,  normal range of motion    Data Review:    CBC Recent Labs  Lab 10/13/17 1916  WBC 8.1  HGB 10.2*  HCT 33.7*  PLT 256  MCV 91.6  MCH 27.7  MCHC 30.3  RDW 15.2    ------------------------------------------------------------------------------------------------------------------  Chemistries  Recent Labs  Lab 10/13/17 1916  NA 136  K 4.6  CL 102  CO2 27  GLUCOSE 90  BUN 33*  CREATININE 2.09*  CALCIUM 8.9   ------------------------------------------------------------------------------------------------------------------  ------------------------------------------------------------------------------------------------------------------ GFR: Estimated Creatinine Clearance: 34.1 mL/min (A) (by C-G formula based on SCr of 2.09 mg/dL (H)). Liver Function Tests: No results for input(s): AST, ALT, ALKPHOS, BILITOT, PROT, ALBUMIN in the last 168 hours. No results for input(s): LIPASE, AMYLASE in the last 168 hours. No results for input(s): AMMONIA in the last 168 hours. Coagulation Profile: No results for input(s): INR, PROTIME in the last 168 hours. Cardiac Enzymes: Recent Labs  Lab 10/13/17 1856 10/13/17 1916  CKTOTAL  --  162  TROPONINI <0.03  --    BNP (last 3 results) No results for input(s): PROBNP in the last 8760 hours. HbA1C: No results for input(s): HGBA1C in the last 72 hours. CBG: Recent Labs  Lab 10/13/17 1856  GLUCAP 105*   Lipid Profile: No results for input(s): CHOL, HDL, LDLCALC, TRIG, CHOLHDL, LDLDIRECT in the last 72 hours. Thyroid Function Tests: Recent Labs    10/13/17 1916  TSH 199.456*   Anemia Panel: No results for input(s): VITAMINB12, FOLATE, FERRITIN, TIBC, IRON, RETICCTPCT in the last 72 hours.  --------------------------------------------------------------------------------------------------------------- Urine analysis:    Component Value Date/Time   COLORURINE YELLOW 09/02/2017 1821   APPEARANCEUR CLEAR 09/02/2017 1821   LABSPEC 1.013 09/02/2017 1821   PHURINE 5.0 09/02/2017 1821   GLUCOSEU NEGATIVE 09/02/2017 1821   HGBUR SMALL (A) 09/02/2017 1821   BILIRUBINUR NEGATIVE 09/02/2017 1821    KETONESUR NEGATIVE 09/02/2017 1821   PROTEINUR NEGATIVE 09/02/2017 1821   UROBILINOGEN 0.2 12/04/2014 0650   NITRITE NEGATIVE 09/02/2017 1821   LEUKOCYTESUR NEGATIVE 09/02/2017 1821      Imaging Results:    Ct Head Wo Contrast  Result Date: 10/13/2017 CLINICAL DATA:  Syncope and fall. EXAM: CT HEAD WITHOUT CONTRAST TECHNIQUE: Contiguous axial images were obtained from the base of the skull through the vertex without intravenous contrast. COMPARISON:  CT head dated September 02, 2017. FINDINGS: Brain: No evidence of acute infarction, hemorrhage, hydrocephalus, extra-axial collection or mass lesion/mass effect. Stable atrophy and chronic microvascular ischemic changes. Vascular: No hyperdense vessel or unexpected calcification. Skull: Negative for fracture or focal lesion. Sinuses/Orbits: No acute finding. Other: None. IMPRESSION: 1.  No acute intracranial abnormality. Electronically Signed   By: Titus Dubin M.D.   On: 10/13/2017 21:29   Dg Chest Port 1 View  Result Date: 10/13/2017 CLINICAL DATA:  Syncope and weakness. EXAM: PORTABLE CHEST 1 VIEW COMPARISON:  09/02/2017 FINDINGS: The patient is rotated. Cardiomegaly and pulmonary vascular congestion noted. Chronic RIGHT basilar atelectasis/scarring noted. No definite pleural effusion or pneumothorax. No acute bony abnormalities. IMPRESSION: Cardiomegaly with pulmonary vascular congestion. Electronically Signed   By: Margarette Canada M.D.   On: 10/13/2017 19:50    My personal review of EKG: Rhythm NSR, Rate 49 /min, QTc 461,no Acute ST changes   Assessment & Plan:    Active Problems:   Morbid obesity ([place])   HTN (hypertension), benign   CKD (chronic kidney disease), stage III (HCC)   Hypothyroidism   Uncontrolled type 2 diabetes mellitus with  stage 3 chronic kidney disease ([place])   Syncope due to orthostatic hypotension   Syncope   1. Syncope-likely from orthostatic hypotension, patient does have Holter monitor in place.  Will consult  cardiology in a.m. for further recommendations.  2. Severe hypothyroidism-patient's TSH is significantly elevated 199.456, will start patient on 100 mcg of Synthroid daily.  She took 25 mcg dose at home today, will give additional 75 mcg of Synthroid x1 in the ED.  She will need to follow-up with PCP to recheck TSH in 3 to 4 weeks  3. Diabetes mellitus-continue with Levemir, will start sliding scale insulin with NovoLog.  4. Acute kidney injury on chronic kidney disease stage III-today patient's creatinine is 2.09, baseline creatinine is around 1.20.  Will hold Lasix, start gentle IV hydration with normal saline.  Follow BMP in am.  5. Chronic diastolic CHF-we will hold Lasix due to worsening renal function, patient does not appear to be in exacerbation.    6. Coronary artery disease-stable, continue dual antiplatelet therapy with aspirin Plavix for DES to LAD.   DVT Prophylaxis-   Lovenox   AM Labs Ordered, also please review Full Orders  Family Communication: Admission, patients condition and plan of care including tests being ordered have been discussed with the patient and her family members at bedside who indicate understanding and agree with the plan and Code Status.  Code Status: DNR  Admission status: Inpatient  Time spent in minutes : 60 minutes   Oswald Hillock M.D on 10/13/2017 at 10:52 PM  Between 7am to 7pm - Pager - [phone]. After 7pm go to [email or web address] - password [email or web address]  [facility] - Office  [phone]

## 2017-10-13 NOTE — ED Provider Notes (Signed)
[facility] EMERGENCY DEPARTMENT Provider Note   CSN: [id number] Arrival date & time: 10/13/17  1832     History   Chief Complaint Chief Complaint  Patient presents with  . Loss of Consciousness    HPI Stephanie Combs is a 67 y.o. female.  sHe is brought in by EMS after being found on the floor in her house.  She states she was sitting in her chair and was reaching over to get something off the floor and then does not recall anything after that.  She thinks she might have woken up in the floor and then went back to sleep and feels like she is been there for a couple hours.  It sounds like family eventually called and came over and found her and called EMS.  She is complaining of feeling lightheaded and floating kind of feeling in her head.  She denies any head pain.  She is also complaining of some back pain and she has a history of chronic back pain and is on narcotics for this.  She states her last dose of this was probably 10 hours ago.  She denies taking any extra doses of meds.  She was admitted about 2 months ago for syncopal event.  She denies any recent change to her meds.  Is complaining of feeling very thirsty.  She also has chronic lower extremity edema.  She denies any chest pain or shortness of breath or abdominal pain.  No neck pain.  The history is provided by the patient.  Fall  This is a new problem. The current episode started 3 to 5 hours ago. The problem has not changed since onset.Pertinent negatives include no chest pain, no abdominal pain, no headaches and no shortness of breath. The symptoms are aggravated by bending and twisting. The symptoms are relieved by rest. She has tried rest for the symptoms. The treatment provided mild relief.    Past Medical History:  Diagnosis Date  . Anxiety   . CAD (coronary artery disease)   . CHF (congestive heart failure) (HCC)    Diastolic  . Chronic back pain   . COPD (chronic obstructive pulmonary disease) ([place])   .  Degenerative disk disease   . Diabetes mellitus without complication ([place])   . Hypertension   . Hypothyroidism   . On home O2    2.5 L N/C prn  . Pedal edema     Patient Active Problem List   Diagnosis Date Noted  . Syncope due to orthostatic hypotension 09/04/2017  . Sinus arrhythmia 09/04/2017  . Obesity, Class III, BMI 40-49.9 (morbid obesity) ([place]) 09/04/2017  . Syncope and collapse 09/02/2017  . Abnormal nuclear stress test 05/19/2017  . Atypical angina ([place]) 05/19/2017  . Edema 05/15/2017  . Skin ulcer ([place]) 01/30/2017  . Tinea cruris 10/10/2016  . Vitamin D deficiency 08/19/2016  . Personal history of noncompliance with medical treatment, presenting hazards to health 06/17/2016  . Left hand weakness 12/06/2015  . CAP (community acquired pneumonia) 07/25/2015  . Pressure ulcer 05/02/2015  . Hyponatremia 05/01/2015  . Acute-on-chronic kidney injury ([place]) 05/01/2015  . Uncontrolled type 2 diabetes mellitus with stage 3 chronic kidney disease ([place]) 05/01/2015  . COPD exacerbation ([place]) 04/30/2015  . Cellulitis of foot, right 03/24/2015  . Elevated d-dimer 03/24/2015  . Peripheral edema 12/04/2014  . Bilateral edema of lower extremity   . Diabetic ulcer of right great toe ([place])   . Foot ulcer due to secondary DM ([place]) 11/07/2014  .  Diabetic ulcer of right foot ([place]) 11/07/2014  . Edema of lower extremity 05/27/2013  . Headache 05/26/2013  . CKD (chronic kidney disease), stage III ([place]) 04/14/2013  . Leukocytosis 04/14/2013  . Anemia in CKD (chronic kidney disease) 04/14/2013  . Dyspnea 04/14/2013  . Chronic diastolic CHF (congestive heart failure) ([place])   . Hypothyroidism   . COPD (chronic obstructive pulmonary disease) ([place]) 08/07/2012  . Morbid obesity ([place]) 08/07/2012  . DM type 2 (diabetes mellitus, type 2) ([place]) 08/07/2012  . HTN (hypertension), benign 08/07/2012  . Hypothyroidism (acquired) 08/07/2012    Past Surgical History:  Procedure Laterality Date  .  CORONARY STENT INTERVENTION N/A 05/22/2017   Procedure: CORONARY STENT INTERVENTION;  Surgeon: Martinique, Peter M, MD;  Location: [place] [facility];  Service: Cardiovascular;  Laterality: N/A;  . HERNIA REPAIR    . RIGHT/LEFT HEART CATH AND CORONARY ANGIOGRAPHY N/A 05/19/2017   Procedure: RIGHT/LEFT HEART CATH AND CORONARY ANGIOGRAPHY;  Surgeon: Leonie Man, MD;  Location: [place] [facility];  Service: Cardiovascular;  Laterality: N/A;  . TUBAL LIGATION       OB History   None      Home Medications    Prior to Admission medications   Medication Sig Start Date End Date Taking? Authorizing Provider  albuterol (PROAIR HFA) 108 (90 BASE) MCG/ACT inhaler Inhale 1 puff into the lungs every 4 (four) hours as needed. For shortness of breath Patient taking differently: Inhale 2 puffs into the lungs every 4 (four) hours as needed. For shortness of breath 05/30/13   Kinnie Feil, MD  ALPRAZolam Duanne Moron) 1 MG tablet Take 1 mg by mouth 3 (three) times daily as needed for anxiety or sleep.  01/05/17   [provider]  aspirin EC 81 MG tablet Take 81 mg by mouth daily.    [provider]  atorvastatin (LIPITOR) 80 MG tablet Take 1 tablet (80 mg total) by mouth daily at 6 PM. 05/26/17   [name], MD  Cholecalciferol (VITAMIN D3) 5000 units CAPS Take 1 capsule (5,000 Units total) by mouth daily. 08/19/16   Cassandria Anger, MD  clopidogrel (PLAVIX) 75 MG tablet Take 1 tablet (75 mg total) by mouth daily. 05/26/17   [name], MD  ferrous sulfate 325 (65 FE) MG tablet Take 1 tablet (325 mg total) by mouth daily with breakfast. 02/05/17   [name], MD  furosemide (LASIX) 40 MG tablet Take 0.5 tablets (20 mg total) by mouth daily. 09/04/17   Dhungel, Flonnie Overman, MD  gabapentin (NEURONTIN) 300 MG capsule Take 1 capsule (300 mg total) by mouth 3 (three) times daily. 02/05/17   [name], MD  Hydrocortisone (GERHARDT'S BUTT CREAM) CREA Apply 1 application topically 3 (three) times daily.  05/26/17   [name], MD  insulin aspart (NOVOLOG) 100 UNIT/ML injection Inject 0-9 Units into the skin 3 (three) times daily with meals. Patient taking differently: Inject 1-18 Units into the skin 3 (three) times daily with meals. Per sliding scale 05/26/17   [name], MD  insulin detemir (LEVEMIR) 100 UNIT/ML injection Inject 0.52 mLs (52 Units total) into the skin at bedtime. 05/26/17   [name], MD  levothyroxine (SYNTHROID, LEVOTHROID) 25 MCG tablet Take 1 tablet (25 mcg total) by mouth daily at 6 (six) AM. 09/05/17   Dhungel, Flonnie Overman, MD  metoprolol tartrate (LOPRESSOR) 50 MG tablet Take 0.5 tablets (25 mg total) by mouth 2 (two) times daily. 09/04/17   Dhungel, Flonnie Overman, MD  nystatin (MYCOSTATIN/NYSTOP) powder Apply  topically 2 (two) times daily. 10/10/16   Eber Jones, MD  Omega-3 Fatty Acids (FISH OIL PO) Take 1 capsule by mouth daily.     [provider]  ondansetron (ZOFRAN) 4 MG tablet Take 4 mg by mouth every 8 (eight) hours as needed for vomiting.  01/01/17   [provider]  oxyCODONE-acetaminophen (PERCOCET) 10-325 MG tablet Take 1 tablet by mouth every 6 (six) hours as needed for pain.    [provider]  pantoprazole (PROTONIX) 40 MG tablet Take 1 tablet (40 mg total) by mouth daily. 02/05/17   [name], MD  polyethylene glycol [facility] / Floria Raveling) packet Take 17 g by mouth daily as needed for mild constipation or moderate constipation. 05/26/17   [name], MD  silver sulfADIAZINE (SILVADENE) 1 % cream Apply 1 application topically daily.    [provider]  SURE COMFORT INS SYR 1CC/28G 28G X 1/2" 1 ML MISC USE TO INJECT INSULIN UP TO 4 TIMES DAILY. 03/06/17   Cassandria Anger, MD  tiZANidine (ZANAFLEX) 4 MG tablet Take 4 mg by mouth every 8 (eight) hours as needed for muscle spasms.  01/10/17   [provider]  topiramate (TOPAMAX) 25 MG tablet Take 75 mg by mouth daily.  01/02/17   [provider]  triamcinolone cream  (KENALOG) 0.1 % Apply 1 application topically 4 (four) times daily.  01/24/17   [provider]  umeclidinium-vilanterol (ANORO ELLIPTA) 62.5-25 MCG/INH AEPB Inhale 1 puff into the lungs daily. 05/26/17   [name], MD  VICTOZA 18 MG/3ML SOPN Inject 1.2 mg into the skin daily.  01/01/17   [provider]    Family History Family History  Problem Relation Age of Onset  . Stroke Mother   . Heart attack Father   . Diabetes Brother   . Cerebral palsy Brother   . Pneumonia Brother   . Diabetes Other   . Heart attack Other     Social History Social History   Tobacco Use  . Smoking status: Former Smoker    Packs/day: 0.25    Types: Cigarettes    Last attempt to quit: 06/27/2015    Years since quitting: 2.2  . Smokeless tobacco: Never Used  Substance Use Topics  . Alcohol use: No    Alcohol/week: 0.0 oz  . Drug use: No     Allergies   Dilaudid [hydromorphone hcl]; Actifed cold-allergy [chlorpheniramine-phenylephrine]; Doxycycline; Other; Penicillins; Reglan [metoclopramide]; Valium [diazepam]; and Vistaril [hydroxyzine hcl]   Review of Systems Review of Systems  Constitutional: Negative for chills and fever.  HENT: Negative for nosebleeds and sore throat.   Eyes: Negative for visual disturbance.  Respiratory: Negative for shortness of breath.   Cardiovascular: Negative for chest pain and palpitations.  Gastrointestinal: Negative for abdominal pain, nausea and vomiting.  Genitourinary: Negative for dysuria and hematuria.  Musculoskeletal: Positive for back pain. Negative for neck pain.  Skin: Negative for rash and wound.  Neurological: Positive for syncope and light-headedness. Negative for headaches.     Physical Exam Updated Vital Signs Ht 5\' 6"  (1.676 m)   Wt 117.5 kg (259 lb)   BMI 41.80 kg/m   Physical Exam  Constitutional: She appears well-developed and well-nourished. No distress.  HENT:  Head: Normocephalic and atraumatic.  Right Ear:  External ear normal.  Left Ear: External ear normal.  Nose: Nose normal.  Mouth/Throat: Oropharynx is clear and moist.  Eyes: Pupils are equal, round, and reactive to light. Conjunctivae are normal.  Neck: Normal range of motion. Neck supple.  Cardiovascular: Normal rate and regular rhythm.  No murmur heard. Pulmonary/Chest: Effort normal and breath sounds normal. No respiratory distress.  Abdominal: Soft. She exhibits no mass. There is no tenderness.  Musculoskeletal: Normal range of motion. She exhibits edema and tenderness (diffuse low back). She exhibits no deformity.  Neurological: She is alert. She has normal strength. No cranial nerve deficit or sensory deficit. GCS eye subscore is 4. GCS verbal subscore is 5. GCS motor subscore is 6.  Skin: Skin is warm and dry.  Psychiatric: She has a normal mood and affect.  Nursing note and vitals reviewed.    ED Treatments / Results  Labs (all labs ordered are listed, but only abnormal results are displayed) Labs Reviewed  MRSA PCR SCREENING - Abnormal; Notable for the following components:      Result Value   MRSA by PCR POSITIVE (*)    All other components within normal limits  BASIC METABOLIC PANEL - Abnormal; Notable for the following components:   BUN 33 (*)    Creatinine, Ser 2.09 (*)    GFR calc non Af Amer 23 (*)    GFR calc Af Amer 27 (*)    All other components within normal limits  CBC - Abnormal; Notable for the following components:   RBC 3.68 (*)    Hemoglobin 10.2 (*)    HCT 33.7 (*)    All other components within normal limits  URINALYSIS, ROUTINE W REFLEX MICROSCOPIC - Abnormal; Notable for the following components:   Hgb urine dipstick SMALL (*)    All other components within normal limits  TSH - Abnormal; Notable for the following components:   TSH 199.456 (*)    All other components within normal limits  T4, FREE - Abnormal; Notable for the following components:   Free T4 <0.25 (*)    All other components  within normal limits  HEMOGLOBIN A1C - Abnormal; Notable for the following components:   Hgb A1c MFr Bld 8.1 (*)    All other components within normal limits  CBC - Abnormal; Notable for the following components:   RBC 3.60 (*)    Hemoglobin 10.2 (*)    HCT 32.6 (*)    All other components within normal limits  COMPREHENSIVE METABOLIC PANEL - Abnormal; Notable for the following components:   Sodium 134 (*)    Chloride 100 (*)    Glucose, Bld 177 (*)    BUN 33 (*)    Creatinine, Ser 1.77 (*)    Calcium 8.4 (*)    Total Protein 5.7 (*)    Albumin 3.2 (*)    GFR calc non Af Amer 29 (*)    GFR calc Af Amer 33 (*)    All other components within normal limits  GLUCOSE, CAPILLARY - Abnormal; Notable for the following components:   Glucose-Capillary 133 (*)    All other components within normal limits  GLUCOSE, CAPILLARY - Abnormal; Notable for the following components:   Glucose-Capillary 126 (*)    All other components within normal limits  GLUCOSE, CAPILLARY - Abnormal; Notable for the following components:   Glucose-Capillary 220 (*)    All other components within normal limits  BASIC METABOLIC PANEL - Abnormal; Notable for the following components:   Glucose, Bld 226 (*)    BUN 28 (*)    Creatinine, Ser 1.36 (*)    Calcium 8.8 (*)    GFR calc non Af Amer 39 (*)  GFR calc Af Amer 46 (*)    All other components within normal limits  GLUCOSE, CAPILLARY - Abnormal; Notable for the following components:   Glucose-Capillary 225 (*)    All other components within normal limits  GLUCOSE, CAPILLARY - Abnormal; Notable for the following components:   Glucose-Capillary 216 (*)    All other components within normal limits  GLUCOSE, CAPILLARY - Abnormal; Notable for the following components:   Glucose-Capillary 192 (*)    All other components within normal limits  CBG MONITORING, ED - Abnormal; Notable for the following components:   Glucose-Capillary 105 (*)    All other  components within normal limits  TROPONIN I  LACTIC ACID, PLASMA  LACTIC ACID, PLASMA  CK  BLOOD GAS, VENOUS  HIV ANTIBODY (ROUTINE TESTING)  MAGNESIUM    EKG EKG Interpretation  Date/Time:  Tuesday October 13 2017 18:43:44 EDT Ventricular Rate:  49 PR Interval:    QRS Duration: 112 QT Interval:  510 QTC Calculation: 461 R Axis:   58 Text Interpretation:  Sinus bradycardia Borderline intraventricular conduction delay slower than prior tracing 4/19 Confirmed by Aletta Edouard [phone]) on 10/13/2017 6:47:26 PM   Radiology Ct Head Wo Contrast  Result Date: 10/13/2017 CLINICAL DATA:  Syncope and fall. EXAM: CT HEAD WITHOUT CONTRAST TECHNIQUE: Contiguous axial images were obtained from the base of the skull through the vertex without intravenous contrast. COMPARISON:  CT head dated September 02, 2017. FINDINGS: Brain: No evidence of acute infarction, hemorrhage, hydrocephalus, extra-axial collection or mass lesion/mass effect. Stable atrophy and chronic microvascular ischemic changes. Vascular: No hyperdense vessel or unexpected calcification. Skull: Negative for fracture or focal lesion. Sinuses/Orbits: No acute finding. Other: None. IMPRESSION: 1.  No acute intracranial abnormality. Electronically Signed   By: Titus Dubin M.D.   On: 10/13/2017 21:29   Dg Chest Port 1 View  Result Date: 10/13/2017 CLINICAL DATA:  Syncope and weakness. EXAM: PORTABLE CHEST 1 VIEW COMPARISON:  09/02/2017 FINDINGS: The patient is rotated. Cardiomegaly and pulmonary vascular congestion noted. Chronic RIGHT basilar atelectasis/scarring noted. No definite pleural effusion or pneumothorax. No acute bony abnormalities. IMPRESSION: Cardiomegaly with pulmonary vascular congestion. Electronically Signed   By: Margarette Canada M.D.   On: 10/13/2017 19:50    Procedures Procedures (including critical care time)  Medications Ordered in ED Medications  sodium chloride 0.9 % bolus 1,000 mL (has no administration in time range)       Initial Impression / Assessment and Plan / ED Course  I have reviewed the triage vital signs and the nursing notes.  Pertinent labs & imaging results that were available during my care of the patient were reviewed by me and considered in my medical decision making (see chart for details).  Clinical Course as of Oct 15 1140  Tue Oct 13, 2017  2107 Patient was initially hypotensive on arrival and I ordered some IV fluids.  She does have a significant history of some CHF and her chest x-ray was read as increased edema.  I did asked the nurse to stop the IV fluids until we get a better sense of what her fluid status is.  Her creatinine is bumped up from her baseline so she must have some level of prerenal.    [MB]  2109 Her TSH is quite high has any prior levels that high before.  She does have a history of hypothyroidism and is on supplementation.  It seems like she has been bradycardic on her last few evaluations so I do not  think that completely accounts for her being in acute hypothyroidism but it may be a contributing factor.   [MB]  2133 Review the results with the patient and she states she still is feeling very weak and uncomfortable about returning home.  I ultimately do not feel a great idea to get her home to with her thyroid being off with her worsening of her renal function and her multiple chronic medical complaints.  Hospitalist has been paged for admission.   [MB]  2149 Discussed with Dr. Darrick Meigs hospitalist who will evaluate the patient in the ED for admission.   [MB]    Clinical Course User Index [MB] Hayden Rasmussen, MD    Final Clinical Impressions(s) / ED Diagnoses   Final diagnoses:  Syncope and collapse  Low back pain without sciatica, unspecified back pain laterality, unspecified chronicity  AKI (acute kidney injury) ([place])  Lightheadedness  Hypothyroidism, unspecified type  Bradycardia    ED Discharge Orders    None       Hayden Rasmussen, MD 10/15/17  1147

## 2017-10-13 NOTE — ED Triage Notes (Signed)
Patient brought in by EMS for syncopal episode after standing up today. Per EMS, heart rate was in 50's and B/P 80/42 upon arrival to home.

## 2017-10-14 ENCOUNTER — Encounter (HOSPITAL_COMMUNITY): Payer: Self-pay | Admitting: Student

## 2017-10-14 DIAGNOSIS — I10 Essential (primary) hypertension: Secondary | ICD-10-CM

## 2017-10-14 DIAGNOSIS — R0789 Other chest pain: Secondary | ICD-10-CM

## 2017-10-14 DIAGNOSIS — R001 Bradycardia, unspecified: Secondary | ICD-10-CM

## 2017-10-14 DIAGNOSIS — N183 Chronic kidney disease, stage 3 (moderate): Secondary | ICD-10-CM

## 2017-10-14 DIAGNOSIS — I25118 Atherosclerotic heart disease of native coronary artery with other forms of angina pectoris: Secondary | ICD-10-CM

## 2017-10-14 DIAGNOSIS — E039 Hypothyroidism, unspecified: Secondary | ICD-10-CM

## 2017-10-14 DIAGNOSIS — R55 Syncope and collapse: Principal | ICD-10-CM

## 2017-10-14 DIAGNOSIS — I5032 Chronic diastolic (congestive) heart failure: Secondary | ICD-10-CM

## 2017-10-14 DIAGNOSIS — Z955 Presence of coronary angioplasty implant and graft: Secondary | ICD-10-CM

## 2017-10-14 LAB — URINALYSIS, ROUTINE W REFLEX MICROSCOPIC
Bacteria, UA: NONE SEEN
Bilirubin Urine: NEGATIVE
Glucose, UA: NEGATIVE mg/dL
Ketones, ur: NEGATIVE mg/dL
Leukocytes, UA: NEGATIVE
Nitrite: NEGATIVE
Protein, ur: NEGATIVE mg/dL
Specific Gravity, Urine: 1.009 (ref 1.005–1.030)
pH: 5 (ref 5.0–8.0)

## 2017-10-14 LAB — GLUCOSE, CAPILLARY
Glucose-Capillary: 133 mg/dL — ABNORMAL HIGH (ref 65–99)
Glucose-Capillary: 126 mg/dL — ABNORMAL HIGH (ref 65–99)
Glucose-Capillary: 220 mg/dL — ABNORMAL HIGH (ref 65–99)
Glucose-Capillary: 225 mg/dL — ABNORMAL HIGH (ref 65–99)
Glucose-Capillary: 216 mg/dL — ABNORMAL HIGH (ref 65–99)

## 2017-10-14 LAB — T4, FREE: Free T4: <0.25 ng/dL — ABNORMAL LOW (ref 0.82–1.77)

## 2017-10-14 LAB — CBC
HCT: 32.6 % — ABNORMAL LOW (ref 36.0–46.0)
Hemoglobin: 10.2 g/dL — ABNORMAL LOW (ref 12.0–15.0)
MCH: 28.3 pg (ref 26.0–34.0)
MCHC: 31.3 g/dL (ref 30.0–36.0)
MCV: 90.6 fL (ref 78.0–100.0)
Platelets: 257 10*3/uL (ref 150–400)
RBC: 3.6 MIL/uL — ABNORMAL LOW (ref 3.87–5.11)
RDW: 15 % (ref 11.5–15.5)
WBC: 7.6 10*3/uL (ref 4.0–10.5)

## 2017-10-14 LAB — MRSA PCR SCREENING: MRSA by PCR: POSITIVE — AB

## 2017-10-14 LAB — COMPREHENSIVE METABOLIC PANEL
ALT: 14 U/L (ref 14–54)
AST: 21 U/L (ref 15–41)
Albumin: 3.2 g/dL — ABNORMAL LOW (ref 3.5–5.0)
Alkaline Phosphatase: 73 U/L (ref 38–126)
Anion gap: 6 (ref 5–15)
BUN: 33 mg/dL — ABNORMAL HIGH (ref 6–20)
CO2: 28 mmol/L (ref 22–32)
Calcium: 8.4 mg/dL — ABNORMAL LOW (ref 8.9–10.3)
Chloride: 100 mmol/L — ABNORMAL LOW (ref 101–111)
Creatinine, Ser: 1.77 mg/dL — ABNORMAL HIGH (ref 0.44–1.00)
GFR calc Af Amer: 33 mL/min — ABNORMAL LOW (ref >60)
GFR calc non Af Amer: 29 mL/min — ABNORMAL LOW (ref >60)
Glucose, Bld: 177 mg/dL — ABNORMAL HIGH (ref 65–99)
Potassium: 4.1 mmol/L (ref 3.5–5.1)
Sodium: 134 mmol/L — ABNORMAL LOW (ref 135–145)
Total Bilirubin: 0.4 mg/dL (ref 0.3–1.2)
Total Protein: 5.7 g/dL — ABNORMAL LOW (ref 6.5–8.1)

## 2017-10-14 LAB — HEMOGLOBIN A1C
Hgb A1c MFr Bld: 8.1 % — ABNORMAL HIGH (ref 4.8–5.6)
Mean Plasma Glucose: 185.77 mg/dL

## 2017-10-14 MED ORDER — LEVOTHYROXINE SODIUM 100 MCG PO TABS
100.0000 ug | ORAL_TABLET | Freq: Every day | ORAL | Status: DC
  Administered 2017-10-14 – 2017-10-16 (×3): 100 ug via ORAL
  Filled 2017-10-14 (×3): qty 1

## 2017-10-14 MED ORDER — OXYCODONE HCL 5 MG PO TABS
5.0000 mg | ORAL_TABLET | Freq: Four times a day (QID) | ORAL | Status: DC | PRN
  Administered 2017-10-14 – 2017-10-15 (×5): 5 mg via ORAL
  Filled 2017-10-14 (×6): qty 1

## 2017-10-14 MED ORDER — POLYETHYLENE GLYCOL 3350 17 G PO PACK
17.0000 g | PACK | Freq: Every day | ORAL | Status: DC | PRN
  Administered 2017-10-15: 17 g via ORAL
  Filled 2017-10-14 (×2): qty 1

## 2017-10-14 MED ORDER — UMECLIDINIUM-VILANTEROL 62.5-25 MCG/INH IN AEPB
1.0000 | INHALATION_SPRAY | Freq: Every day | RESPIRATORY_TRACT | Status: DC
  Administered 2017-10-14 – 2017-10-16 (×3): 1 via RESPIRATORY_TRACT
  Filled 2017-10-14: qty 14

## 2017-10-14 MED ORDER — INSULIN DETEMIR 100 UNIT/ML ~~LOC~~ SOLN
52.0000 [IU] | Freq: Every day | SUBCUTANEOUS | Status: DC
  Administered 2017-10-14 – 2017-10-15 (×3): 52 [IU] via SUBCUTANEOUS
  Filled 2017-10-14 (×6): qty 0.52

## 2017-10-14 MED ORDER — INSULIN ASPART 100 UNIT/ML ~~LOC~~ SOLN
3.0000 [IU] | Freq: Three times a day (TID) | SUBCUTANEOUS | Status: DC
  Administered 2017-10-14 – 2017-10-15 (×3): 3 [IU] via SUBCUTANEOUS

## 2017-10-14 MED ORDER — ONDANSETRON HCL 4 MG PO TABS: 4.0000 mg | ORAL_TABLET | Freq: Four times a day (QID) | ORAL | Status: DC | PRN

## 2017-10-14 MED ORDER — OXYCODONE-ACETAMINOPHEN 10-325 MG PO TABS: 1.0000 | ORAL_TABLET | Freq: Four times a day (QID) | ORAL | Status: DC | PRN

## 2017-10-14 MED ORDER — ATORVASTATIN CALCIUM 40 MG PO TABS
80.0000 mg | ORAL_TABLET | Freq: Every day | ORAL | Status: DC
  Administered 2017-10-14 – 2017-10-15 (×2): 80 mg via ORAL
  Filled 2017-10-14 (×2): qty 2

## 2017-10-14 MED ORDER — FERROUS SULFATE 325 (65 FE) MG PO TABS
325.0000 mg | ORAL_TABLET | Freq: Every day | ORAL | Status: DC
  Administered 2017-10-14 – 2017-10-16 (×4): 325 mg via ORAL
  Filled 2017-10-14 (×3): qty 1

## 2017-10-14 MED ORDER — SODIUM CHLORIDE 0.9 % IV SOLN
INTRAVENOUS | Status: DC
  Administered 2017-10-14 – 2017-10-15 (×3): via INTRAVENOUS

## 2017-10-14 MED ORDER — INSULIN ASPART 100 UNIT/ML ~~LOC~~ SOLN
0.0000 [IU] | Freq: Three times a day (TID) | SUBCUTANEOUS | Status: DC
  Administered 2017-10-14: 3 [IU] via SUBCUTANEOUS
  Administered 2017-10-15 (×2): 2 [IU] via SUBCUTANEOUS
  Administered 2017-10-15: 3 [IU] via SUBCUTANEOUS
  Administered 2017-10-16: 2 [IU] via SUBCUTANEOUS
  Administered 2017-10-16: 3 [IU] via SUBCUTANEOUS

## 2017-10-14 MED ORDER — ALPRAZOLAM 1 MG PO TABS
1.0000 mg | ORAL_TABLET | Freq: Three times a day (TID) | ORAL | Status: DC | PRN
  Administered 2017-10-14 – 2017-10-16 (×6): 1 mg via ORAL
  Filled 2017-10-14 (×6): qty 1

## 2017-10-14 MED ORDER — TIZANIDINE HCL 4 MG PO TABS: 4.0000 mg | ORAL_TABLET | Freq: Three times a day (TID) | ORAL | Status: DC | PRN

## 2017-10-14 MED ORDER — ONDANSETRON HCL 4 MG/2ML IJ SOLN: 4.0000 mg | Freq: Four times a day (QID) | INTRAMUSCULAR | Status: DC | PRN

## 2017-10-14 MED ORDER — ASPIRIN EC 81 MG PO TBEC
81.0000 mg | DELAYED_RELEASE_TABLET | Freq: Every day | ORAL | Status: DC
  Administered 2017-10-14 – 2017-10-16 (×3): 81 mg via ORAL
  Filled 2017-10-14 (×3): qty 1

## 2017-10-14 MED ORDER — CLOPIDOGREL BISULFATE 75 MG PO TABS
75.0000 mg | ORAL_TABLET | Freq: Every day | ORAL | Status: DC
  Administered 2017-10-14 – 2017-10-16 (×3): 75 mg via ORAL
  Filled 2017-10-14 (×3): qty 1

## 2017-10-14 MED ORDER — PANTOPRAZOLE SODIUM 40 MG PO TBEC
40.0000 mg | DELAYED_RELEASE_TABLET | Freq: Every day | ORAL | Status: DC
  Administered 2017-10-14 – 2017-10-16 (×3): 40 mg via ORAL
  Filled 2017-10-14 (×3): qty 1

## 2017-10-14 MED ORDER — OXYCODONE-ACETAMINOPHEN 5-325 MG PO TABS
1.0000 | ORAL_TABLET | Freq: Four times a day (QID) | ORAL | Status: DC | PRN
  Administered 2017-10-14 – 2017-10-16 (×6): 1 via ORAL
  Filled 2017-10-14 (×7): qty 1

## 2017-10-14 MED ORDER — GABAPENTIN 300 MG PO CAPS
300.0000 mg | ORAL_CAPSULE | Freq: Three times a day (TID) | ORAL | Status: DC
  Administered 2017-10-14 – 2017-10-16 (×7): 300 mg via ORAL
  Filled 2017-10-14 (×7): qty 1

## 2017-10-14 MED ORDER — ORAL CARE MOUTH RINSE
15.0000 mL | Freq: Two times a day (BID) | OROMUCOSAL | Status: DC
  Administered 2017-10-14 – 2017-10-16 (×5): 15 mL via OROMUCOSAL

## 2017-10-14 MED ORDER — ENOXAPARIN SODIUM 60 MG/0.6ML ~~LOC~~ SOLN
0.5000 mg/kg | SUBCUTANEOUS | Status: DC
  Administered 2017-10-14 – 2017-10-16 (×3): 60 mg via SUBCUTANEOUS
  Filled 2017-10-14 (×3): qty 0.6

## 2017-10-14 MED ORDER — TOPIRAMATE 25 MG PO TABS
75.0000 mg | ORAL_TABLET | Freq: Every day | ORAL | Status: DC
Start: 2017-10-14 — End: 2017-10-16
  Administered 2017-10-14 – 2017-10-16 (×3): 75 mg via ORAL
  Filled 2017-10-14 (×3): qty 3

## 2017-10-14 NOTE — Consult Note (Addendum)
Cardiology Consult    Patient ID: Stephanie Combs; [id number]; 02-01-1951   Admit date: 10/13/2017 Date of Consult: 10/14/2017  Primary Care Provider: [name], MD Primary Cardiologist: Kate Sable, MD    Patient Profile    Stephanie Combs is a 67 y.o. female with past medical history of CAD (s/p DES to LAD and angioplasty to D1 in 05/2017), chronic diastolic CHF, HTN, HLD, COPD, and Stage 3 CKD who is being seen today for the evaluation of syncope at the request of Dr. Darrick Meigs.   History of Present Illness    [name] was last examined by Dr. [name] in 07/2017 and reported having left elbow pain at that time which would radiate into her jaw and chest. Also reported having a history of chronic chest pain which had been present for several years. She was continued on DAPT with ASA and Plavix. Imdur was increased to 60 mg daily and Metoprolol was increased to 50 mg twice daily.  In the interim, she was admitted to [facility] from 09/02/2017 to 09/04/2017 for evaluation following a syncopal episode. She was found on the floor by a neighbor and was unsure how long she had lost consciousness for. Denied any specific prodromal symptoms at that time. During admission, her syncopal episode was thought to be due to orthostasis as SBP dropped by more than 60 mm Hg with standing by review of the discharge summary. Lopressor dosing was reduced from 50mg  BID to 25mg  BID and Lasix was reduced to 20mg  daily. A 30-day cardiac event monitor was arranged and she has been wearing this over the past month.  She presented back to the ED on 10/13/2017 for recurrent syncopal event. She reports that she was sitting in a chair and woke up on the floor. She is unable to recall how long she lost consciousness for. Denies any specific chest discomfort or palpitations prior to her event. She does report feeling weak over the past several days and consuming little food and drink. She has also experienced weakness  along her right arm for the past several months and is being followed by Orthopedics. Denies any associated headaches, vision changes, or slurred speech  She does report having episodes of significant chest discomfort for the past several months which can last for hours at a time and are not associated with exertion or positional changes.  She is unsure of what her weight has been at home but denies any recent orthopnea or PND. Does have chronic lower extremity edema. Metoprolol was reduced at the time of her last hospitalization and she has been taking Lopressor 25 mg twice daily.  Says that her heart rate has been in the 50's to 60's when checked at home.  While in the ED, BP was soft at 96/55. This had actually been at 80/42 by EMS report with heart rate in the 50's upon their arrival. Labs show WBC 8.1, Hgb 10.2, platelets 256, Na+ 136, K+ 4.6, and creatinine 2.09 (baseline 1.2 - 1.4).  TSH 199.45. CXR shows cardiomegaly with pulmonary vascular congestion. CT Head with no acute intracranial abnormalities. EKG shows sinus bradycardia, heart rate 49, with no acute ST changes when compared to prior tracings.   Past Medical History:  Diagnosis Date  . Anxiety   . CAD (coronary artery disease)    a. s/p DES to LAD and angioplasty to D1 in 05/2017  . CHF (congestive heart failure) (HCC)    Diastolic  . Chronic back pain   .  COPD (chronic obstructive pulmonary disease) ([place])   . Degenerative disk disease   . Diabetes mellitus without complication ([place])   . Hypertension   . Hypothyroidism   . On home O2    2.5 L N/C prn  . Pedal edema     Past Surgical History:  Procedure Laterality Date  . CORONARY STENT INTERVENTION N/A 05/22/2017   Procedure: CORONARY STENT INTERVENTION;  Surgeon: Martinique, Peter M, MD;  Location: [place] [facility];  Service: Cardiovascular;  Laterality: N/A;  . HERNIA REPAIR    . RIGHT/LEFT HEART CATH AND CORONARY ANGIOGRAPHY N/A 05/19/2017   Procedure: RIGHT/LEFT HEART  CATH AND CORONARY ANGIOGRAPHY;  Surgeon: Leonie Man, MD;  Location: [place] [facility];  Service: Cardiovascular;  Laterality: N/A;  . TUBAL LIGATION       Home Medications:  Prior to Admission medications   Medication Sig Start Date End Date Taking? Authorizing Provider  albuterol (PROAIR HFA) 108 (90 BASE) MCG/ACT inhaler Inhale 1 puff into the lungs every 4 (four) hours as needed. For shortness of breath Patient taking differently: Inhale 2 puffs into the lungs every 4 (four) hours as needed. For shortness of breath 05/30/13  Yes Buriev, Arie Sabina, MD  albuterol (PROVENTIL) (2.5 MG/3ML) 0.083% nebulizer solution Take 2.5 mg by nebulization 2 (two) times daily.   Yes [provider]  ALPRAZolam Duanne Moron) 1 MG tablet Take 1 mg by mouth 3 (three) times daily as needed for anxiety or sleep.  01/05/17  Yes [provider]  aspirin EC 81 MG tablet Take 81 mg by mouth daily.   Yes [provider]  atorvastatin (LIPITOR) 80 MG tablet Take 1 tablet (80 mg total) by mouth daily at 6 PM. Patient taking differently: Take 80 mg by mouth every morning.  05/26/17  Yes [name], MD  Cholecalciferol (VITAMIN D3) 5000 units CAPS Take 1 capsule (5,000 Units total) by mouth daily. 08/19/16  Yes Cassandria Anger, MD  clopidogrel (PLAVIX) 75 MG tablet Take 1 tablet (75 mg total) by mouth daily. 05/26/17  Yes [name], MD  ferrous sulfate 325 (65 FE) MG tablet Take 1 tablet (325 mg total) by mouth daily with breakfast. 02/05/17  Yes [name], MD  furosemide (LASIX) 40 MG tablet Take 0.5 tablets (20 mg total) by mouth daily. Patient taking differently: Take 40 mg by mouth daily.  09/04/17  Yes Dhungel, Nishant, MD  gabapentin (NEURONTIN) 300 MG capsule Take 1 capsule (300 mg total) by mouth 3 (three) times daily. 02/05/17  Yes [name], MD  Hydrocortisone (GERHARDT'S BUTT CREAM) CREA Apply 1 application topically 3 (three) times daily. 05/26/17  Yes [name], MD  insulin aspart  (NOVOLOG) 100 UNIT/ML injection Inject 0-9 Units into the skin 3 (three) times daily with meals. Patient taking differently: Inject 1-18 Units into the skin 3 (three) times daily with meals. Per sliding scale 05/26/17  Yes [name], MD  insulin detemir (LEVEMIR) 100 UNIT/ML injection Inject 0.52 mLs (52 Units total) into the skin at bedtime. 05/26/17  Yes [name], MD  levothyroxine (SYNTHROID, LEVOTHROID) 25 MCG tablet Take 1 tablet (25 mcg total) by mouth daily at 6 (six) AM. 09/05/17  Yes Dhungel, Nishant, MD  metoprolol tartrate (LOPRESSOR) 50 MG tablet Take 0.5 tablets (25 mg total) by mouth 2 (two) times daily. Patient taking differently: Take 25 mg by mouth daily.  09/04/17  Yes Dhungel, Nishant, MD  nystatin (MYCOSTATIN/NYSTOP) powder Apply topically 2 (two) times daily. 10/10/16  Yes  Eber Jones, MD  Omega-3 Fatty Acids (FISH OIL PO) Take 1 capsule by mouth daily.    Yes [provider]  ondansetron (ZOFRAN) 4 MG tablet Take 4 mg by mouth every 8 (eight) hours as needed for vomiting.  01/01/17  Yes [provider]  oxyCODONE-acetaminophen (PERCOCET) 10-325 MG tablet Take 1 tablet by mouth every 6 (six) hours as needed for pain.   Yes [provider]  OXYGEN Inhale 2 L into the lungs continuous.   Yes [provider]  pantoprazole (PROTONIX) 40 MG tablet Take 1 tablet (40 mg total) by mouth daily. 02/05/17  Yes [name], MD  silver sulfADIAZINE (SILVADENE) 1 % cream Apply 1 application topically daily.   Yes [provider]  tiZANidine (ZANAFLEX) 4 MG tablet Take 8 mg by mouth 2 (two) times daily.  01/10/17  Yes [provider]  topiramate (TOPAMAX) 25 MG tablet Take 75 mg by mouth daily.  01/02/17  Yes [provider]  triamcinolone cream (KENALOG) 0.1 % Apply 1 application topically 4 (four) times daily.  01/24/17  Yes [provider]  umeclidinium-vilanterol (ANORO ELLIPTA) 62.5-25 MCG/INH AEPB Inhale 1 puff into  the lungs daily. 05/26/17  Yes [name], MD  VICTOZA 18 MG/3ML SOPN Inject 1.2 mg into the skin daily.  01/01/17  Yes [provider]  SURE COMFORT INS SYR 1CC/28G 28G X 1/2" 1 ML MISC USE TO INJECT INSULIN UP TO 4 TIMES DAILY. 03/06/17   Cassandria Anger, MD    Inpatient Medications: Scheduled Meds: . aspirin EC  81 mg Oral Daily  . atorvastatin  80 mg Oral q1800  . clopidogrel  75 mg Oral Daily  . enoxaparin (LOVENOX) injection  0.5 mg/kg Subcutaneous Q24H  . ferrous sulfate  325 mg Oral Q breakfast  . gabapentin  300 mg Oral TID  . insulin aspart  0-9 Units Subcutaneous TID WC  . insulin detemir  52 Units Subcutaneous QHS  . levothyroxine  100 mcg Oral QAC breakfast  . mouth rinse  15 mL Mouth Rinse BID  . pantoprazole  40 mg Oral Daily  . topiramate  75 mg Oral Daily  . umeclidinium-vilanterol  1 puff Inhalation Daily   Continuous Infusions: . sodium chloride 75 mL/hr at 10/14/17 0117   PRN Meds: ALPRAZolam, ondansetron **OR** ondansetron (ZOFRAN) IV, oxyCODONE-acetaminophen **AND** oxyCODONE, polyethylene glycol, tiZANidine  Allergies:    Allergies  Allergen Reactions  . Dilaudid [Hydromorphone Hcl] Itching  . Actifed Cold-Allergy [Chlorpheniramine-Phenylephrine]     "I was sick and red and it didn't agree with me at all"  . Doxycycline Nausea And Vomiting  . Other Cough    Pt states she is allergic to ragweed and that she starts coughing and sneezing like crazy  . Penicillins Hives and Itching    Tolerates Rocephin Has patient had a PCN reaction causing immediate rash, facial/tongue/throat swelling, SOB or lightheadedness with hypotension: Yes Has patient had a PCN reaction causing severe rash involving mucus membranes or skin necrosis: No Has patient had a PCN reaction that required hospitalization Yes Has patient had a PCN reaction occurring within the last 10 years: No If all of the above answers are "NO", then may proceed with Cephalosporin use.   .  Reglan [Metoclopramide] Itching  . Valium [Diazepam] Itching  . Vistaril [Hydroxyzine Hcl] Itching    Social History:   Social History   Socioeconomic History  . Marital status: Widowed    Spouse name: Not on file  .  Number of children: Not on file  . Years of education: Not on file  . Highest education level: Not on file  Occupational History  . Not on file  Social Needs  . Financial resource strain: Not on file  . Food insecurity:    Worry: Not on file    Inability: Not on file  . Transportation needs:    Medical: Not on file    Non-medical: Not on file  Tobacco Use  . Smoking status: Former Smoker    Packs/day: 0.25    Types: Cigarettes    Last attempt to quit: 06/27/2015    Years since quitting: 2.3  . Smokeless tobacco: Never Used  Substance and Sexual Activity  . Alcohol use: No    Alcohol/week: 0.0 oz  . Drug use: No  . Sexual activity: Not Currently    Birth control/protection: Surgical  Lifestyle  . Physical activity:    Days per week: Not on file    Minutes per session: Not on file  . Stress: Not on file  Relationships  . Social connections:    Talks on phone: Not on file    Gets together: Not on file    Attends religious service: Not on file    Active member of club or organization: Not on file    Attends meetings of clubs or organizations: Not on file    Relationship status: Not on file  . Intimate partner violence:    Fear of current or ex partner: Not on file    Emotionally abused: Not on file    Physically abused: Not on file    Forced sexual activity: Not on file  Other Topics Concern  . Not on file  Social History Narrative  . Not on file     Family History:    Family History  Problem Relation Age of Onset  . Stroke Mother   . Heart attack Father   . Diabetes Brother   . Cerebral palsy Brother   . Pneumonia Brother   . Diabetes Other   . Heart attack Other       Review of Systems    General:  No chills, fever, night sweats  or weight changes. Positive for recurrent syncope.   Cardiovascular:  No edema, orthopnea, palpitations, paroxysmal nocturnal dyspnea. Positive for chest pain and dyspnea on exertion.  Dermatological: No rash, lesions/masses Respiratory: No cough, dyspnea Urologic: No hematuria, dysuria Abdominal:   No nausea, vomiting, diarrhea, bright red blood per rectum, melena, or hematemesis Neurologic:  No visual changes, wkns, changes in mental status. All other systems reviewed and are otherwise negative except as noted above.  Physical Exam/Data    Vitals:   10/14/17 0000 10/14/17 0054 10/14/17 0524 10/14/17 0649  BP: (!) 154/77 (!) 172/83 (!) 123/92   Pulse:  (!) 48 (!) 55   Resp: 12 18 19    Temp:  97.6 F (36.4 C) 97.9 F (36.6 C)   TempSrc:  Oral Oral   SpO2:  100% 99%   Weight:  278 lb (126.1 kg)  279 lb 1.6 oz (126.6 kg)  Height:  5\' 6"  (1.676 m)      Intake/Output Summary (Last 24 hours) at 10/14/2017 0855 Last data filed at 10/14/2017 0700 Gross per 24 hour  Intake 1968.75 ml  Output 1300 ml  Net 668.75 ml   Filed Weights   10/13/17 1836 10/14/17 0054 10/14/17 0649  Weight: 259 lb (117.5 kg) 278 lb (126.1 kg) 279 lb 1.6 oz (  126.6 kg)   Body mass index is 45.05 kg/m.   General: Pleasant, elderly Caucasian female appearing in NAD Psych: Normal affect. Neuro: Alert and oriented X 3. Moves all extremities spontaneously. HEENT: Normal  Neck: Supple without bruits or JVD. Lungs:  Resp regular and unlabored, CTA without wheezing or rales. Heart: Bradycardiac rate, regular rhythm, no s3, s4, or murmurs. Abdomen: Soft, non-tender, non-distended, BS + x 4.  Extremities: No clubbing or cyanosis. Chronic edema. DP/PT/Radials 2+ and equal bilaterally.   EKG:  The EKG was personally reviewed and demonstrates: Sinus bradycardia, heart rate 49, with no acute ST changes when compared to prior tracings.   Labs/Studies     Relevant CV Studies:  Cardiac Catheterization:  05/2017  Ost LAD lesion is 65% stenosed.  Prox LAD-1 lesion is 70% stenosed with 80% stenosed side branch in Ost 1st Diag.  Prox LAD-2 lesion is 60% stenosed.  LV end diastolic pressure is mildly elevated.  There is no aortic valve stenosis.  Hemodynamic findings consistent with mild pulmonary hypertension.   Patient has essentially severe single/2-vessel disease involving the LAD major diagonal branch with otherwise normal circumflex, ramus and RCA with PDA/PLA.  There is ostial LAD as well as bifurcation LAD having diagonal.  By rights, the patient's best option would be bypass surgery, however I have been told by her primary team that she probably is not a good bypass candidate.  I will therefore discuss the findings with my interventional colleagues to determine if there is an approachable option for bifurcation PCI.  The ostial LAD may be not approachable.  Plan: She will be transferred to [facility] 6 E./stepdown unit. We will start IV heparin 6-8 hours post sheath removal/TR band removal. Continue symptomatic treatment with anginal agents.   Will determine course of action after discussion with interventional colleagues.   Coronary Stent Intervention: 05/22/2017  Ost LAD lesion is 55% stenosed.  Prox LAD lesion is 95% stenosed with 80% stenosed side branch in Ost 1st Diag.  A drug-eluting stent was successfully placed using a STENT SYNERGY DES 2.5X20.  Post intervention, there is a 0% residual stenosis.  Post intervention, the side branch was reduced to 20% residual stenosis.   1. Successful PCI with stenting of the LAD with a DES and balloon angioplasty of the diagonal branch   Plan: continue DAPT for at least one year. Anticipate DC when medically stable.    Echocardiogram: 05/16/2017 Study Conclusions  - Left ventricle: The cavity size was normal. Wall thickness was   increased in a pattern of moderate LVH. Systolic function was   normal. The  estimated ejection fraction was in the range of 60%   to 65%. Wall motion was normal; there were no regional wall   motion abnormalities. Doppler parameters are consistent with   abnormal left ventricular relaxation (grade 1 diastolic   dysfunction). - Mitral valve: Mildly to moderately calcified annulus. - Left atrium: The atrium was moderately dilated. - Right ventricle: The cavity size was mildly dilated. Wall   thickness was normal. Systolic function was mildly reduced. - Right atrium: The atrium was moderately dilated. - Pulmonary arteries: Systolic pressure was mildly to moderately   increased.   Laboratory Data:  Chemistry Recent Labs  Lab 10/13/17 1916 10/14/17 0519  NA 136 134*  K 4.6 4.1  CL 102 100*  CO2 27 28  GLUCOSE 90 177*  BUN 33* 33*  CREATININE 2.09* 1.77*  CALCIUM 8.9 8.4*  GFRNONAA 23* 29*  GFRAA  27* 33*  ANIONGAP 7 6    Recent Labs  Lab 10/14/17 0519  PROT 5.7*  ALBUMIN 3.2*  AST 21  ALT 14  ALKPHOS 73  BILITOT 0.4   Hematology Recent Labs  Lab 10/13/17 1916 10/14/17 0519  WBC 8.1 7.6  RBC 3.68* 3.60*  HGB 10.2* 10.2*  HCT 33.7* 32.6*  MCV 91.6 90.6  MCH 27.7 28.3  MCHC 30.3 31.3  RDW 15.2 15.0  PLT 256 257   Cardiac Enzymes Recent Labs  Lab 10/13/17 1856  TROPONINI <0.03   No results for input(s): TROPIPOC in the last 168 hours.  BNPNo results for input(s): BNP, PROBNP in the last 168 hours.  DDimer No results for input(s): DDIMER in the last 168 hours.  Radiology/Studies:  Ct Head Wo Contrast  Result Date: 10/13/2017 CLINICAL DATA:  Syncope and fall. EXAM: CT HEAD WITHOUT CONTRAST TECHNIQUE: Contiguous axial images were obtained from the base of the skull through the vertex without intravenous contrast. COMPARISON:  CT head dated September 02, 2017. FINDINGS: Brain: No evidence of acute infarction, hemorrhage, hydrocephalus, extra-axial collection or mass lesion/mass effect. Stable atrophy and chronic microvascular ischemic  changes. Vascular: No hyperdense vessel or unexpected calcification. Skull: Negative for fracture or focal lesion. Sinuses/Orbits: No acute finding. Other: None. IMPRESSION: 1.  No acute intracranial abnormality. Electronically Signed   By: Titus Dubin M.D.   On: 10/13/2017 21:29   Dg Chest Port 1 View  Result Date: 10/13/2017 CLINICAL DATA:  Syncope and weakness. EXAM: PORTABLE CHEST 1 VIEW COMPARISON:  09/02/2017 FINDINGS: The patient is rotated. Cardiomegaly and pulmonary vascular congestion noted. Chronic RIGHT basilar atelectasis/scarring noted. No definite pleural effusion or pneumothorax. No acute bony abnormalities. IMPRESSION: Cardiomegaly with pulmonary vascular congestion. Electronically Signed   By: Margarette Canada M.D.   On: 10/13/2017 19:50     Assessment & Plan    1. Recurrent Syncopal Events - The patient reports multiple symptoms over the past several weeks but most notable is that she has been having a decreased appetite and fluid intake over the past several days while continuing on Lasix. She experienced a syncopal event in 08/2017 and was found to be orthostatic at that time. In reviewing records, it does not appear that orthostatics have yet been obtained this admission.   - Will recheck orthostatic vitals at this time (of note, she was started on IVF at time of admission). Continue to hold Lasix in the setting of presumed orthostasis and her AKI.  - She has been bradycardic on telemetry and Lopressor has been held since admission. Her dose was reduced last admission but heart rates have continued to be low in the 40's to 50's. Would hold all AV nodal blocking agents at this time and continue to follow on telemetry. She has an event monitor in place and I did touch base with the office to review that we have not yet received any critical results. The full report from 6/4 is pending.   2. CAD - s/p DES to LAD and angioplasty to D1 in 05/2017.  - She reports having episodes of  chest wall pain which is worse with deep breathing and can last for hours at a time. This does not resemble when she required stent placement earlier this year. Initial troponin was negative and EKG shows no acute ischemic changes.   - continue ASA, Plavix, and statin therapy. BB held due to bradycardia.   3. Chronic Diastolic CHF - She has chronic edema on examination but  denies any recent changes in her respiratory status.  Receiving IV fluids at this time due to orthostasis and AKI. Continue to hold Lasix and monitor fluid status closely.   4. Acute on Chronic Stage 3 CKD  - creatinine elevated to 2.09 (baseline 1.2 - 1.4). Improved to 1.77 this AM.  - continue with IVF at this time.   5. Uncontrolled Hypothyroidism - TSH significantly elevated to 199.45 on admission. - Synthroid dose has been adjusted by the admitting team.   For questions or updates, please contact [place] Please consult [email or web address] for contact info under Cardiology/STEMI.  Signed, Erma Heritage, PA-C 10/14/2017, 8:55 AM Pager: [phone]   The patient was seen and examined, and I agree with the history, physical exam, assessment and plan as documented above, with modifications as noted below. I have also personally reviewed all relevant documentation, old records, labs, and both radiographic and cardiovascular studies. I have also independently interpreted old and new ECG's.  Briefly, this is a 67 year old woman whom I know well from clinic and most recently evaluated her on 07/21/2017.  She has coronary artery disease with drug-eluting stent placement to the LAD and angioplasty of the diagonal branch as noted above.  She has a history of chronic chest wall pain dating back several years.  She was hospitalized in April for syncope thought due to orthostatic hypotension.  Metoprolol was reduced to 25 mg twice daily and Lasix was reduced to 20 mg daily and a 30-day event monitor was placed.  She  presented to the [facility] ED on 10/13/2017 for syncope.  She was sitting in a chair and woke up on the floor.  She is a bit drowsy at the time of my evaluation but responds to questions appropriately.  She complains of her usual chest pains.  It is exacerbated with specific movements in bed.  Of note, TSH is 199.45.  Levothyroxine dose has since been adjusted.  Blood pressure was 96/55 but had been 80/42 as per EMS with a heart rate in the 50s.  ECG which I personally reviewed demonstrates sinus bradycardia, heart rate 49 bpm, with no acute ischemic abnormalities.  She is now receiving IV fluids as she had some acute on chronic renal failure.  Metoprolol and Lasix have since been discontinued.  Assessment and plan: I suspect her syncopal episode is multifactorial in etiology with the primary reason being severe hypothyroidism.  Severe hypothyroidism and of itself will lead to profound bradycardia and possibly syncope.  Bradycardia should improve with cessation of AV nodal blocking agents and correction of hypothyroidism with recent adjustment in levothyroxine dose (increased from 25 mcg daily to 100 mcg daily).  Thus far no critical results have been reported with respect to her event monitor.  She is stable with respect to ischemic heart disease and I do not feel her syncopal episode was mediated by ischemia.  Continue aspirin, Plavix, and statin.  Continue IV fluids for orthostatic hypotension and syncope and acute on chronic renal failure but monitor volume status closely given her history of chronic diastolic heart failure and some degree of pulmonary vascular congestion seen on chest x-ray at time of admission.   Kate Sable, MD, [facility]  10/14/2017 11:01 AM

## 2017-10-14 NOTE — Progress Notes (Signed)
PROGRESS NOTE    Stephanie HILGERT  Combs:[id number]  DOB: 1950/09/27  DOA: 10/13/2017 PCP: [name], MD   Brief Admission Hx: Stephanie Combs  is a 67 y.o. female, with history of coronary artery disease, recent cardiac catheter in January 2019 showed proximal LAD lesion of 95% stenosis and 80% stenosed sidebranch in OST first diagonal, drug-eluting stent with balloon angioplasty of diagonal branch on dual antiplatelet therapy, chronic diastolic heart failure, hypertension, dyslipidemia, chronic kidney disease stage III, type 2 diabetes mellitus, hypothyroidism, syncope, morbid obesity, anemia who came to hospital after patient passed out at home.  Patient says that she was sitting in chair and was reaching over to get something off the floor and then does not recall anything after that.  Patient says that she was on the floor for unknown duration of time and she was very tired and went back to sleep after that.     MDM/Assessment & Plan:    1. Syncope-likely from orthostatic hypotension, patient does have Holter monitor in place.  Cardiology has seen patient and do not feel syncope is from ischemic heart disease but from uncontrolled hypothyroidism.    2. Severe hypothyroidism-patient's TSH is significantly elevated 199.456, will start patient on 100 mcg of Synthroid daily.  She took 25 mcg dose at home today, will give additional 75 mcg of Synthroid x1 in the ED.  She will need to follow-up with PCP to recheck TSH in 3 to 4 weeks  3. Diabetes mellitus-continue with Levemir, will start sliding scale insulin with NovoLog.  CBG (last 3)  Recent Labs    10/14/17 0105 10/14/17 0817 10/14/17 1139  GLUCAP 133* 126* 220*    4. Acute kidney injury on chronic kidney disease stage III-today patient's creatinine is 2.09, baseline creatinine is around 1.20.  Will hold Lasix, start gentle IV hydration with normal saline.  Follow BMP in am.  5. Chronic diastolic CHF-we will hold Lasix due to  worsening renal function, patient does not appear to be in exacerbation.    6. Coronary artery disease-stable, continue dual antiplatelet therapy with aspirin Plavix for DES to LAD.   DVT Prophylaxis-   Lovenox   AM Labs Ordered, also please review Full Orders  Family Communication: Admission, patients condition and plan of care including tests being ordered have been discussed with the patient and her family members at bedside who indicate understanding and agree with the plan and Code Status.  Code Status: DNR  Subjective: Pt says that she continues to feel weak.  She has no chest pain and no SOB.   Objective: Vitals:   10/14/17 0054 10/14/17 0524 10/14/17 0649 10/14/17 0926  BP: (!) 172/83 (!) 123/92    Pulse: (!) 48 (!) 55    Resp: 18 19    Temp: 97.6 F (36.4 C) 97.9 F (36.6 C)    TempSrc: Oral Oral    SpO2: 100% 99%  98%  Weight: 126.1 kg (278 lb)  126.6 kg (279 lb 1.6 oz)   Height: 5\' 6"  (1.676 m)       Intake/Output Summary (Last 24 hours) at 10/14/2017 1513 Last data filed at 10/14/2017 1100 Gross per 24 hour  Intake 1968.75 ml  Output 2100 ml  Net -131.25 ml   Filed Weights   10/13/17 1836 10/14/17 0054 10/14/17 0649  Weight: 117.5 kg (259 lb) 126.1 kg (278 lb) 126.6 kg (279 lb 1.6 oz)     REVIEW OF SYSTEMS  As per history otherwise all reviewed and  reported negative  Exam:  General exam: awake, alert, NAD Respiratory system: Clear. No increased work of breathing. Cardiovascular system: S1 & S2 heard, RRR. No JVD, murmurs, gallops, clicks or pedal edema. Gastrointestinal system: Abdomen is nondistended, soft and nontender. Normal bowel sounds heard. Central nervous system: Alert and oriented. No focal neurological deficits. Extremities: no CCE.  Data Reviewed: Basic Metabolic Panel: Recent Labs  Lab 10/13/17 1916 10/14/17 0519  NA 136 134*  K 4.6 4.1  CL 102 100*  CO2 27 28  GLUCOSE 90 177*  BUN 33* 33*  CREATININE 2.09* 1.77*    CALCIUM 8.9 8.4*   Liver Function Tests: Recent Labs  Lab 10/14/17 0519  AST 21  ALT 14  ALKPHOS 73  BILITOT 0.4  PROT 5.7*  ALBUMIN 3.2*   No results for input(s): LIPASE, AMYLASE in the last 168 hours. No results for input(s): AMMONIA in the last 168 hours. CBC: Recent Labs  Lab 10/13/17 1916 10/14/17 0519  WBC 8.1 7.6  HGB 10.2* 10.2*  HCT 33.7* 32.6*  MCV 91.6 90.6  PLT 256 257   Cardiac Enzymes: Recent Labs  Lab 10/13/17 1856 10/13/17 1916  CKTOTAL  --  162  TROPONINI <0.03  --    CBG (last 3)  Recent Labs    10/14/17 0105 10/14/17 0817 10/14/17 1139  GLUCAP 133* 126* 220*   Recent Results (from the past 240 hour(s))  MRSA PCR Screening     Status: Abnormal   Collection Time: 10/14/17  1:04 AM  Result Value Ref Range Status   MRSA by PCR POSITIVE (A) NEGATIVE Final    Comment:        The GeneXpert MRSA Assay (FDA approved for NASAL specimens only), is one component of a comprehensive MRSA colonization surveillance program. It is not intended to diagnose MRSA infection nor to guide or monitor treatment for MRSA infections. RESULT CALLED TO, READ BACK BY AND VERIFIED WITH: BONNIE STONE 10/14/17 @ 0912 [id number] Performed at [facility], [address] [place], [place] [address]      Studies: Ct Head Wo Contrast  Result Date: 10/13/2017 CLINICAL DATA:  Syncope and fall. EXAM: CT HEAD WITHOUT CONTRAST TECHNIQUE: Contiguous axial images were obtained from the base of the skull through the vertex without intravenous contrast. COMPARISON:  CT head dated September 02, 2017. FINDINGS: Brain: No evidence of acute infarction, hemorrhage, hydrocephalus, extra-axial collection or mass lesion/mass effect. Stable atrophy and chronic microvascular ischemic changes. Vascular: No hyperdense vessel or unexpected calcification. Skull: Negative for fracture or focal lesion. Sinuses/Orbits: No acute finding. Other: None. IMPRESSION: 1.  No acute intracranial abnormality.  Electronically Signed   By: Titus Dubin M.D.   On: 10/13/2017 21:29   Dg Chest Port 1 View  Result Date: 10/13/2017 CLINICAL DATA:  Syncope and weakness. EXAM: PORTABLE CHEST 1 VIEW COMPARISON:  09/02/2017 FINDINGS: The patient is rotated. Cardiomegaly and pulmonary vascular congestion noted. Chronic RIGHT basilar atelectasis/scarring noted. No definite pleural effusion or pneumothorax. No acute bony abnormalities. IMPRESSION: Cardiomegaly with pulmonary vascular congestion. Electronically Signed   By: Margarette Canada M.D.   On: 10/13/2017 19:50     Scheduled Meds: . aspirin EC  81 mg Oral Daily  . atorvastatin  80 mg Oral q1800  . clopidogrel  75 mg Oral Daily  . enoxaparin (LOVENOX) injection  0.5 mg/kg Subcutaneous Q24H  . ferrous sulfate  325 mg Oral Q breakfast  . gabapentin  300 mg Oral TID  . insulin aspart  0-9 Units  Subcutaneous TID WC  . insulin detemir  52 Units Subcutaneous QHS  . levothyroxine  100 mcg Oral QAC breakfast  . mouth rinse  15 mL Mouth Rinse BID  . pantoprazole  40 mg Oral Daily  . topiramate  75 mg Oral Daily  . umeclidinium-vilanterol  1 puff Inhalation Daily   Continuous Infusions: . sodium chloride 75 mL/hr at 10/14/17 0117    Active Problems:   Morbid obesity (HCC)   HTN (hypertension), benign   CKD (chronic kidney disease), stage III (HCC)   Hypothyroidism   Uncontrolled type 2 diabetes mellitus with stage 3 chronic kidney disease ([place])   Syncope due to orthostatic hypotension   Syncope   Time spent:   Irwin Brakeman, MD, FAAFP [facility] Pager [phone]  If 7PM-7AM, please contact night-coverage [email or web address] Password [email or web address] 10/14/2017, 3:13 PM    LOS: 1 day

## 2017-10-15 ENCOUNTER — Inpatient Hospital Stay (HOSPITAL_COMMUNITY): Payer: Medicare HMO

## 2017-10-15 DIAGNOSIS — R079 Chest pain, unspecified: Secondary | ICD-10-CM

## 2017-10-15 DIAGNOSIS — N179 Acute kidney failure, unspecified: Secondary | ICD-10-CM

## 2017-10-15 LAB — BASIC METABOLIC PANEL
Anion gap: 5 (ref 5–15)
BUN: 28 mg/dL — ABNORMAL HIGH (ref 6–20)
CO2: 28 mmol/L (ref 22–32)
Calcium: 8.8 mg/dL — ABNORMAL LOW (ref 8.9–10.3)
Chloride: 106 mmol/L (ref 101–111)
Creatinine, Ser: 1.36 mg/dL — ABNORMAL HIGH (ref 0.44–1.00)
GFR calc Af Amer: 46 mL/min — ABNORMAL LOW (ref >60)
GFR calc non Af Amer: 39 mL/min — ABNORMAL LOW (ref >60)
Glucose, Bld: 226 mg/dL — ABNORMAL HIGH (ref 65–99)
Potassium: 4.3 mmol/L (ref 3.5–5.1)
Sodium: 139 mmol/L (ref 135–145)

## 2017-10-15 LAB — TROPONIN I
Troponin I: <0.03 ng/mL (ref <0.03)
Troponin I: <0.03 ng/mL (ref <0.03)

## 2017-10-15 LAB — GLUCOSE, CAPILLARY
Glucose-Capillary: 159 mg/dL — ABNORMAL HIGH (ref 65–99)
Glucose-Capillary: 192 mg/dL — ABNORMAL HIGH (ref 65–99)
Glucose-Capillary: 202 mg/dL — ABNORMAL HIGH (ref 65–99)
Glucose-Capillary: 236 mg/dL — ABNORMAL HIGH (ref 65–99)

## 2017-10-15 LAB — HIV ANTIBODY (ROUTINE TESTING W REFLEX): HIV Screen 4th Generation wRfx: NONREACTIVE

## 2017-10-15 LAB — MAGNESIUM: Magnesium: 1.8 mg/dL (ref 1.7–2.4)

## 2017-10-15 MED ORDER — ALUM & MAG HYDROXIDE-SIMETH 200-200-20 MG/5ML PO SUSP
30.0000 mL | Freq: Four times a day (QID) | ORAL | Status: DC | PRN
  Administered 2017-10-16: 30 mL via ORAL
  Filled 2017-10-15: qty 30

## 2017-10-15 MED ORDER — MUPIROCIN 2 % EX OINT
1.0000 "application " | TOPICAL_OINTMENT | Freq: Two times a day (BID) | CUTANEOUS | Status: DC
  Administered 2017-10-15 – 2017-10-16 (×3): 1 via NASAL
  Filled 2017-10-15: qty 22

## 2017-10-15 MED ORDER — CHLORHEXIDINE GLUCONATE CLOTH 2 % EX PADS
6.0000 | MEDICATED_PAD | Freq: Every day | CUTANEOUS | Status: DC
  Administered 2017-10-15: 6 via TOPICAL

## 2017-10-15 MED ORDER — FUROSEMIDE 40 MG PO TABS
40.0000 mg | ORAL_TABLET | Freq: Every day | ORAL | Status: DC
  Filled 2017-10-15: qty 1

## 2017-10-15 MED ORDER — NITROGLYCERIN 0.4 MG SL SUBL
0.4000 mg | SUBLINGUAL_TABLET | SUBLINGUAL | Status: DC | PRN
  Administered 2017-10-15 (×2): 0.4 mg via SUBLINGUAL
  Filled 2017-10-15 (×2): qty 1

## 2017-10-15 MED ORDER — ISOSORBIDE MONONITRATE ER 60 MG PO TB24
30.0000 mg | ORAL_TABLET | Freq: Every day | ORAL | Status: DC
Start: 2017-10-15 — End: 2017-10-16
  Administered 2017-10-15 – 2017-10-16 (×2): 30 mg via ORAL
  Filled 2017-10-15 (×2): qty 1

## 2017-10-15 MED ORDER — DOCUSATE SODIUM 100 MG PO CAPS
200.0000 mg | ORAL_CAPSULE | Freq: Two times a day (BID) | ORAL | Status: DC
  Administered 2017-10-15 – 2017-10-16 (×3): 200 mg via ORAL
  Filled 2017-10-15 (×3): qty 2

## 2017-10-15 MED ORDER — INSULIN ASPART 100 UNIT/ML ~~LOC~~ SOLN
5.0000 [IU] | Freq: Three times a day (TID) | SUBCUTANEOUS | Status: DC
Start: 2017-10-15 — End: 2017-10-16
  Administered 2017-10-15 – 2017-10-16 (×3): 5 [IU] via SUBCUTANEOUS

## 2017-10-15 NOTE — Progress Notes (Signed)
Inpatient Diabetes Program Recommendations  AACE/ADA: New Consensus Statement on Inpatient Glycemic Control (2019)  Target Ranges:  Prepandial:   less than 140 mg/dL      Peak postprandial:   less than 180 mg/dL (1-2 hours)      Critically ill patients:  140 - 180 mg/dL   Results for Stephanie Combs, Stephanie Combs (MRN [id number]) as of 10/15/2017 08:13  Ref. Range 10/14/2017 08:17 10/14/2017 11:39 10/14/2017 17:05 10/14/2017 22:25  Glucose-Capillary Latest Ref Range: 65 - 99 mg/dL 126 (H) 220 (H) 225 (H) 216 (H)   Review of Glycemic Control  Diabetes history: DM2 Outpatient Diabetes medications: Levemir 52 units QHS, Novolog 1-18 units TID with meals, Victoza 1.2 mg daily Current orders for Inpatient glycemic control: Levemir 52 units QHS, Novolog 0-9 units TID with meals, Novolog 3 units TID with meals for meal coverage  Inpatient Diabetes Program Recommendations: Correction (SSI): Please consider increasing Novolog correction to moderate scale (0-15 units) and adding Novolog 0-5 units QHS for bedtime correction scale.  Thanks, Barnie Alderman, RN, MSN, CDE Diabetes Coordinator Inpatient Diabetes Program [phone] (Team Pager from 8am to 5pm)

## 2017-10-15 NOTE — Progress Notes (Signed)
C/O sharp chest pain and soreness and stated that it comes and goes as it has been doing at home and for which she takes nitro.  Texted Dr. Wynetta Emery and he ordered EKG, CXR, Troponin and nitro

## 2017-10-15 NOTE — Progress Notes (Addendum)
Progress Note  Patient Name: Stephanie Combs Date of Encounter: 10/15/2017  Primary Cardiologist: Kate Sable, MD   Subjective   Reports she had "cramping" along her chest and abdomen this morning which spontaneously resolved. Says breathing is at baseline. Still feels significantly fatigued.   Inpatient Medications    Scheduled Meds: . aspirin EC  81 mg Oral Daily  . atorvastatin  80 mg Oral q1800  . clopidogrel  75 mg Oral Daily  . enoxaparin (LOVENOX) injection  0.5 mg/kg Subcutaneous Q24H  . ferrous sulfate  325 mg Oral Q breakfast  . gabapentin  300 mg Oral TID  . insulin aspart  0-9 Units Subcutaneous TID WC  . insulin aspart  3 Units Subcutaneous TID WC  . insulin detemir  52 Units Subcutaneous QHS  . levothyroxine  100 mcg Oral QAC breakfast  . mouth rinse  15 mL Mouth Rinse BID  . pantoprazole  40 mg Oral Daily  . topiramate  75 mg Oral Daily  . umeclidinium-vilanterol  1 puff Inhalation Daily   Continuous Infusions: . sodium chloride 60 mL/hr at 10/15/17 0636   PRN Meds: ALPRAZolam, ondansetron **OR** ondansetron (ZOFRAN) IV, oxyCODONE-acetaminophen **AND** oxyCODONE, polyethylene glycol, tiZANidine   Vital Signs    Vitals:   10/14/17 2054 10/14/17 2203 10/15/17 0647 10/15/17 1001  BP:  (!) 153/62 (!) 143/72   Pulse:  (!) 57 64   Resp:      Temp:  98.3 F (36.8 C) 97.6 F (36.4 C)   TempSrc:  Oral Oral   SpO2: 96% 100% 98% 98%  Weight:   282 lb 10.1 oz (128.2 kg)   Height:        Intake/Output Summary (Last 24 hours) at 10/15/2017 1022 Last data filed at 10/15/2017 0952 Gross per 24 hour  Intake 1440 ml  Output 3250 ml  Net -1810 ml   Filed Weights   10/14/17 0054 10/14/17 0649 10/15/17 0647  Weight: 278 lb (126.1 kg) 279 lb 1.6 oz (126.6 kg) 282 lb 10.1 oz (128.2 kg)    Telemetry    NSR, HR in 60's to 70's with PAC's. Some strips labeled as atrial fibrillation but appear most consistent with NSR and frequent PAC's.  - Personally  Reviewed  ECG    No new tracings.   Physical Exam   General: Well developed, well nourished, female appearing in no acute distress. Head: Normocephalic, atraumatic.  Neck: Supple without bruits, JVD not elevated. Lungs:  Resp regular and unlabored, CTA without wheezing or rales. Heart: RRR, S1, S2, no S3, S4, or murmur; no rub. Abdomen: Soft, non-tender, non-distended with normoactive bowel sounds. No hepatomegaly. No rebound/guarding. No obvious abdominal masses. Extremities: No clubbing or cyanosis, 1+ pitting edema. Distal pedal pulses are 2+ bilaterally. Neuro: Alert and oriented X 3. Moves all extremities spontaneously. Psych: Normal affect.  Labs    Chemistry Recent Labs  Lab 10/13/17 1916 10/14/17 0519 10/15/17 0504  NA 136 134* 139  K 4.6 4.1 4.3  CL 102 100* 106  CO2 27 28 28   GLUCOSE 90 177* 226*  BUN 33* 33* 28*  CREATININE 2.09* 1.77* 1.36*  CALCIUM 8.9 8.4* 8.8*  PROT  --  5.7*  --   ALBUMIN  --  3.2*  --   AST  --  21  --   ALT  --  14  --   ALKPHOS  --  73  --   BILITOT  --  0.4  --   GFRNONAA 23*  29* 39*  GFRAA 27* 33* 46*  ANIONGAP 7 6 5      Hematology Recent Labs  Lab 10/13/17 1916 10/14/17 0519  WBC 8.1 7.6  RBC 3.68* 3.60*  HGB 10.2* 10.2*  HCT 33.7* 32.6*  MCV 91.6 90.6  MCH 27.7 28.3  MCHC 30.3 31.3  RDW 15.2 15.0  PLT 256 257    Cardiac Enzymes Recent Labs  Lab 10/13/17 1856  TROPONINI <0.03   No results for input(s): TROPIPOC in the last 168 hours.   BNPNo results for input(s): BNP, PROBNP in the last 168 hours.   DDimer No results for input(s): DDIMER in the last 168 hours.   Radiology    Ct Head Wo Contrast  Result Date: 10/13/2017 CLINICAL DATA:  Syncope and fall. EXAM: CT HEAD WITHOUT CONTRAST TECHNIQUE: Contiguous axial images were obtained from the base of the skull through the vertex without intravenous contrast. COMPARISON:  CT head dated September 02, 2017. FINDINGS: Brain: No evidence of acute infarction,  hemorrhage, hydrocephalus, extra-axial collection or mass lesion/mass effect. Stable atrophy and chronic microvascular ischemic changes. Vascular: No hyperdense vessel or unexpected calcification. Skull: Negative for fracture or focal lesion. Sinuses/Orbits: No acute finding. Other: None. IMPRESSION: 1.  No acute intracranial abnormality. Electronically Signed   By: Titus Dubin M.D.   On: 10/13/2017 21:29   Dg Chest Port 1 View  Result Date: 10/13/2017 CLINICAL DATA:  Syncope and weakness. EXAM: PORTABLE CHEST 1 VIEW COMPARISON:  09/02/2017 FINDINGS: The patient is rotated. Cardiomegaly and pulmonary vascular congestion noted. Chronic RIGHT basilar atelectasis/scarring noted. No definite pleural effusion or pneumothorax. No acute bony abnormalities. IMPRESSION: Cardiomegaly with pulmonary vascular congestion. Electronically Signed   By: Margarette Canada M.D.   On: 10/13/2017 19:50    Cardiac Studies   Echocardiogram: 05/16/2017 Study Conclusions  - Left ventricle: The cavity size was normal. Wall thickness was   increased in a pattern of moderate LVH. Systolic function was   normal. The estimated ejection fraction was in the range of 60%   to 65%. Wall motion was normal; there were no regional wall   motion abnormalities. Doppler parameters are consistent with   abnormal left ventricular relaxation (grade 1 diastolic   dysfunction). - Mitral valve: Mildly to moderately calcified annulus. - Left atrium: The atrium was moderately dilated. - Right ventricle: The cavity size was mildly dilated. Wall   thickness was normal. Systolic function was mildly reduced. - Right atrium: The atrium was moderately dilated. - Pulmonary arteries: Systolic pressure was mildly to moderately   increased.   Carotid Dopplers: 08/2017 IMPRESSION: Color duplex indicates moderate heterogeneous and calcified plaque, with no hemodynamically significant stenosis by duplex criteria in the extracranial cerebrovascular  circulation.  Patient Profile     67 y.o. female w/ PMH of CAD (s/p DES to LAD and angioplasty to D1 in 05/2017), chronic diastolic CHF, HTN, HLD, COPD, and Stage 3 CKD who presented to [facility] ED on 10/13/2017 for evaluation of syncope. Had previously been admitted for similar symptoms in 08/2017 with an event monitor placed at that time.   Assessment & Plan    1. Recurrent Syncopal Events - has experienced two syncopal events over the past few months with an event monitor being placed in 09/2017. We have not been notified of any critical results at this time.  - she was bradycardiac on telemetry with HR in the 40's at the time of admission but this has improved with holding Lopressor. Would continue to hold Lopressor  at this time. HR should improve with treatment of her severe hypothyroidism as well.  - concern for dehydration at the time of admission and that her episode could have been due to orthostatic hypotension. Creatinine has improved, therefore will stop IVF in the setting of her known chronic diastolic CHF.   2. CAD - s/p DES to LAD and angioplasty to D1 in 05/2017. Has chronic chest wall pain which is unchanged in etiology. Initial troponin value has been negative this admission.  - continue ASA, Plavix, and statin therapy. BB held due to bradycardia. She had been on Imdur 30mg  daily PTA by review of office notes and it is unclear why this was not listed on her home medications at the time of admission. Will reorder.   3. Chronic Diastolic CHF - noted to have chronic edema on examination but lungs are clear. She was receiving IVF at the time of admission due to concern for dehydration and orthostasis. Will stop IVF today as her kidney function has returned to baseline. Anticipate restarting PTA Lasix 40mg  tomorrow.   4. Acute on Chronic Stage 3 CKD  - creatinine elevated to 2.09 (baseline 1.2 - 1.4). Back to baseline of 1.36 this AM.   5. Uncontrolled Hypothyroidism - TSH  significantly elevated to 199.45 on admission. - Synthroid dose has been increased to 100 mcg daily by the admitting team.   For questions or updates, please contact [place] Please consult [email or web address] for contact info under Cardiology/STEMI.   Arna Medici , PA-C 10:22 AM 10/15/2017 Pager: [phone]   Attending note:  I reviewed consultation note by Dr. [name] from yesterday as well as interval hospital course.  Case discussed with Ms. Ahmed Prima PA-C.  Patient is noted to be markedly hypothyroid with TSH of 199, currently managed by primary team on higher dose Synthroid.  Her cardiac monitor has not demonstrated any specific arrhythmias that would clearly explain syncope.  Bradycardia in the 40s was noted on admission but has improved following discontinuation of Lopressor and should further improve with treatment for hypothyroidism.  She also received IV fluids for documented orthostasis.  ACS is not suspected at this time and with plan to continue with medical therapy including aspirin, Plavix, statin, and Imdur.  Further ischemic testing is not planned.  Satira Sark, M.D., F.A.C.C.

## 2017-10-15 NOTE — Progress Notes (Signed)
**Note De-Identified  Obfuscation** EKG complete; RN notified and placed on patient chart

## 2017-10-15 NOTE — Progress Notes (Signed)
PROGRESS NOTE    Stephanie Combs  LFY:[id number]  DOB: 26-Dec-1950  DOA: 10/13/2017 PCP: [name], MD   Brief Admission Hx: Stephanie Combs  is a 67 y.o. female, with history of coronary artery disease, recent cardiac catheter in January 2019 showed proximal LAD lesion of 95% stenosis and 80% stenosed sidebranch in OST first diagonal, drug-eluting stent with balloon angioplasty of diagonal branch on dual antiplatelet therapy, chronic diastolic heart failure, hypertension, dyslipidemia, chronic kidney disease stage III, type 2 diabetes mellitus, hypothyroidism, syncope, morbid obesity, anemia who came to hospital after patient passed out at home.  Patient says that she was sitting in chair and was reaching over to get something off the floor and then does not recall anything after that.  Patient says that she was on the floor for unknown duration of time and she was very tired and went back to sleep after that.     MDM/Assessment & Plan:    1. Syncope-likely from orthostatic hypotension, patient does have Holter monitor in place.  Cardiology has seen patient and do not feel syncope is from ischemic heart disease but from uncontrolled hypothyroidism.    2. Severe hypothyroidism-patient's TSH is significantly elevated 199.456, will start patient on 100 mcg of Synthroid daily.  She took 25 mcg dose at home today, will give additional 75 mcg of Synthroid x1 in the ED.  She will need to follow-up with PCP to recheck TSH in 3 to 4 weeks  3. Diabetes mellitus-continue with Levemir, will start sliding scale insulin with NovoLog.  CBG (last 3)  Recent Labs    10/14/17 2225 10/15/17 0758 10/15/17 1136  GLUCAP 216* 159* 192*    4. Acute kidney injury on chronic kidney disease stage III-today patient's creatinine is 2.09, baseline creatinine is around 1.20.  Improving to baseline, restart lasix tomorrow per cardiology team.    5. Chronic diastolic CHF-we will hold Lasix due to worsening renal  function, patient does not appear to be in exacerbation.    6. Coronary artery disease-stable, continue dual antiplatelet therapy with aspirin Plavix for DES to LAD.  7.  Chest pain - Pt had some atypical chest pain symptoms, we checked EKG, port CXR, and atelectasis seen on CXR, will have her to start incentive spirometry and follow, nitroglycerin ordered as needed for CP symptoms.  No ischemic work up planned by cardiology service.  Troponin normal.    DVT Prophylaxis-   Lovenox   AM Labs Ordered, also please review Full Orders  Family Communication: Admission, patients condition and plan of care including tests being ordered have been discussed with the patient and her family members at bedside who indicate understanding and agree with the plan and Code Status.  Code Status: DNR  Subjective: Pt had chest pain earlier today.    Objective: Vitals:   10/14/17 2203 10/15/17 0647 10/15/17 1001 10/15/17 1454  BP: (!) 153/62 (!) 143/72  (!) 149/78  Pulse: (!) 57 64  93  Resp:    17  Temp: 98.3 F (36.8 C) 97.6 F (36.4 C)  98.7 F (37.1 C)  TempSrc: Oral Oral  Oral  SpO2: 100% 98% 98% 96%  Weight:  128.2 kg (282 lb 10.1 oz)    Height:        Intake/Output Summary (Last 24 hours) at 10/15/2017 1604 Last data filed at 10/15/2017 1522 Gross per 24 hour  Intake 840 ml  Output 2750 ml  Net -1910 ml   Filed Weights   10/14/17 0054  10/14/17 0649 10/15/17 0647  Weight: 126.1 kg (278 lb) 126.6 kg (279 lb 1.6 oz) 128.2 kg (282 lb 10.1 oz)     REVIEW OF SYSTEMS  As per history otherwise all reviewed and reported negative  Exam:  General exam: awake, alert, NAD Respiratory system: Clear. No increased work of breathing. Cardiovascular system: S1 & S2 heard, RRR. No JVD, murmurs, gallops, clicks or pedal edema. Gastrointestinal system: Abdomen is nondistended, soft and nontender. Normal bowel sounds heard. Central nervous system: Alert and oriented. No focal neurological  deficits. Extremities: no CCE.  Data Reviewed: Basic Metabolic Panel: Recent Labs  Lab 10/13/17 1916 10/14/17 0519 10/15/17 0504  NA 136 134* 139  K 4.6 4.1 4.3  CL 102 100* 106  CO2 27 28 28   GLUCOSE 90 177* 226*  BUN 33* 33* 28*  CREATININE 2.09* 1.77* 1.36*  CALCIUM 8.9 8.4* 8.8*  MG  --   --  1.8   Liver Function Tests: Recent Labs  Lab 10/14/17 0519  AST 21  ALT 14  ALKPHOS 73  BILITOT 0.4  PROT 5.7*  ALBUMIN 3.2*   No results for input(s): LIPASE, AMYLASE in the last 168 hours. No results for input(s): AMMONIA in the last 168 hours. CBC: Recent Labs  Lab 10/13/17 1916 10/14/17 0519  WBC 8.1 7.6  HGB 10.2* 10.2*  HCT 33.7* 32.6*  MCV 91.6 90.6  PLT 256 257   Cardiac Enzymes: Recent Labs  Lab 10/13/17 1856 10/13/17 1916 10/15/17 1220  CKTOTAL  --  162  --   TROPONINI <0.03  --  <0.03   CBG (last 3)  Recent Labs    10/14/17 2225 10/15/17 0758 10/15/17 1136  GLUCAP 216* 159* 192*   Recent Results (from the past 240 hour(s))  MRSA PCR Screening     Status: Abnormal   Collection Time: 10/14/17  1:04 AM  Result Value Ref Range Status   MRSA by PCR POSITIVE (A) NEGATIVE Final    Comment:        The GeneXpert MRSA Assay (FDA approved for NASAL specimens only), is one component of a comprehensive MRSA colonization surveillance program. It is not intended to diagnose MRSA infection nor to guide or monitor treatment for MRSA infections. RESULT CALLED TO, READ BACK BY AND VERIFIED WITH: BONNIE STONE 10/14/17 @ 0912 [id number] Performed at [facility], [address], [place], [place] [address]      Studies: Ct Head Wo Contrast  Result Date: 10/13/2017 CLINICAL DATA:  Syncope and fall. EXAM: CT HEAD WITHOUT CONTRAST TECHNIQUE: Contiguous axial images were obtained from the base of the skull through the vertex without intravenous contrast. COMPARISON:  CT head dated September 02, 2017. FINDINGS: Brain: No evidence of acute infarction, hemorrhage,  hydrocephalus, extra-axial collection or mass lesion/mass effect. Stable atrophy and chronic microvascular ischemic changes. Vascular: No hyperdense vessel or unexpected calcification. Skull: Negative for fracture or focal lesion. Sinuses/Orbits: No acute finding. Other: None. IMPRESSION: 1.  No acute intracranial abnormality. Electronically Signed   By: Titus Dubin M.D.   On: 10/13/2017 21:29   Dg Chest Port 1 View  Result Date: 10/15/2017 CLINICAL DATA:  Chest pain that is in the center and radiating to the left per patient. Patient states that it feels like pressure when patient is holding her breath and becomes painful. Hx of CAD, CHF, COPD, diabetes, HTN. Former smoker. EXAM: PORTABLE CHEST 1 VIEW COMPARISON:  10/13/2017 FINDINGS: Patient is rotated. Heart size is mildly enlarged. There are no focal consolidations  or pleural effusions. Minimal RIGHT LOWER lobe atelectasis. IMPRESSION: 1. Patient rotation. 2. RIGHT LOWER lobe atelectasis. Electronically Signed   By: Nolon Nations M.D.   On: 10/15/2017 12:35   Dg Chest Port 1 View  Result Date: 10/13/2017 CLINICAL DATA:  Syncope and weakness. EXAM: PORTABLE CHEST 1 VIEW COMPARISON:  09/02/2017 FINDINGS: The patient is rotated. Cardiomegaly and pulmonary vascular congestion noted. Chronic RIGHT basilar atelectasis/scarring noted. No definite pleural effusion or pneumothorax. No acute bony abnormalities. IMPRESSION: Cardiomegaly with pulmonary vascular congestion. Electronically Signed   By: Margarette Canada M.D.   On: 10/13/2017 19:50     Scheduled Meds: . aspirin EC  81 mg Oral Daily  . atorvastatin  80 mg Oral q1800  . Chlorhexidine Gluconate Cloth  6 each Topical Q0600  . clopidogrel  75 mg Oral Daily  . docusate sodium  200 mg Oral BID  . enoxaparin (LOVENOX) injection  0.5 mg/kg Subcutaneous Q24H  . ferrous sulfate  325 mg Oral Q breakfast  . [START ON 10/16/2017] furosemide  40 mg Oral Daily  . gabapentin  300 mg Oral TID  . insulin  aspart  0-9 Units Subcutaneous TID WC  . insulin aspart  5 Units Subcutaneous TID WC  . insulin detemir  52 Units Subcutaneous QHS  . isosorbide mononitrate  30 mg Oral Daily  . levothyroxine  100 mcg Oral QAC breakfast  . mouth rinse  15 mL Mouth Rinse BID  . mupirocin ointment  1 application Nasal BID  . pantoprazole  40 mg Oral Daily  . topiramate  75 mg Oral Daily  . umeclidinium-vilanterol  1 puff Inhalation Daily   Continuous Infusions:   Active Problems:   Morbid obesity (HCC)   HTN (hypertension), benign   CKD (chronic kidney disease), stage III (HCC)   Hypothyroidism   Uncontrolled type 2 diabetes mellitus with stage 3 chronic kidney disease ([place])   Syncope due to orthostatic hypotension   Syncope   Time spent:   Irwin Brakeman, MD, FAAFP [facility] Pager [phone]  If 7PM-7AM, please contact night-coverage [email or web address] Password [email or web address] 10/15/2017, 4:04 PM    LOS: 2 days

## 2017-10-16 DIAGNOSIS — I25119 Atherosclerotic heart disease of native coronary artery with unspecified angina pectoris: Secondary | ICD-10-CM

## 2017-10-16 DIAGNOSIS — I951 Orthostatic hypotension: Secondary | ICD-10-CM

## 2017-10-16 DIAGNOSIS — E1122 Type 2 diabetes mellitus with diabetic chronic kidney disease: Secondary | ICD-10-CM

## 2017-10-16 DIAGNOSIS — R001 Bradycardia, unspecified: Secondary | ICD-10-CM

## 2017-10-16 DIAGNOSIS — E1165 Type 2 diabetes mellitus with hyperglycemia: Secondary | ICD-10-CM

## 2017-10-16 LAB — GLUCOSE, CAPILLARY
Glucose-Capillary: 180 mg/dL — ABNORMAL HIGH (ref 65–99)
Glucose-Capillary: 207 mg/dL — ABNORMAL HIGH (ref 65–99)
Glucose-Capillary: 240 mg/dL — ABNORMAL HIGH (ref 65–99)

## 2017-10-16 LAB — TROPONIN I: Troponin I: <0.03 ng/mL (ref <0.03)

## 2017-10-16 MED ORDER — FUROSEMIDE 20 MG PO TABS
20.0000 mg | ORAL_TABLET | Freq: Two times a day (BID) | ORAL | Status: DC
  Administered 2017-10-16: 20 mg via ORAL

## 2017-10-16 MED ORDER — LEVOTHYROXINE SODIUM 100 MCG PO TABS: 100.0000 ug | ORAL_TABLET | Freq: Every day | ORAL | 0 refills | Status: DC

## 2017-10-16 MED ORDER — POLYETHYLENE GLYCOL 3350 17 G PO PACK: 17.0000 g | PACK | Freq: Every day | ORAL | 0 refills | Status: DC | PRN

## 2017-10-16 MED ORDER — FUROSEMIDE 40 MG PO TABS: 40.0000 mg | ORAL_TABLET | Freq: Every day | ORAL | Status: DC

## 2017-10-16 MED ORDER — MECLIZINE HCL 12.5 MG PO TABS: 12.5000 mg | ORAL_TABLET | Freq: Two times a day (BID) | ORAL | Status: DC | PRN

## 2017-10-16 MED ORDER — ISOSORBIDE MONONITRATE ER 30 MG PO TB24: 30.0000 mg | ORAL_TABLET | Freq: Every day | ORAL | 0 refills | Status: DC

## 2017-10-16 NOTE — Care Management Note (Addendum)
Case Management Note  Patient Details  Name: AIRIANA ELMAN MRN: [id number] Date of Birth: [birth year]  Subjective/Objective:                    Action/Plan: Patient discharging home today. Tim of [facility] aware of DC.  Patient will have [facility] services as well,   ADDENDUM:  Patient coming up as having [employer] Medicare. [facility] can not accept this insurance. Discussed with patient. [facility] will accept referral.  Financial counselor notified to verify insurance.   Expected Discharge Date:  10/16/17               Expected Discharge Plan:  [place]  In-House Referral:  NA  Discharge planning Services  CM Consult  Post Acute Care Choice:  Home Health Choice offered to:  Patient  DME Arranged:    DME Agency:     HH Arranged:  RN, PT [place] Agency:  [facility] (formerly [employer])  Status of Service:  Completed, signed off  If discussed at [employer], dates discussed:    Additional Comments:  Forever Arechiga, Chauncey [place], RN 10/16/2017, 12:41 PM

## 2017-10-16 NOTE — Discharge Instructions (Signed)
STOP TAKING METOPROLOL SEEK MEDICAL CARE OR RETURN TO EMERGENCY ROOM IF SYMPTOMS RETURN, WORSEN, OR NEW PROBLEM DEVELOPS.  Blood Glucose Monitoring, Adult Monitoring your blood sugar (glucose) helps you manage your diabetes. It also helps you and your health care provider determine how well your diabetes management plan is working. Blood glucose monitoring involves checking your blood glucose as often as directed, and keeping a record (log) of your results over time. Why should I monitor my blood glucose? Checking your blood glucose regularly can:  Help you understand how food, exercise, illnesses, and medicines affect your blood glucose.  Let you know what your blood glucose is at any time. You can quickly tell if you are having low blood glucose (hypoglycemia) or high blood glucose (hyperglycemia).  Help you and your health care provider adjust your medicines as needed.  When should I check my blood glucose? Follow instructions from your health care provider about how often to check your blood glucose. This may depend on:  The type of diabetes you have.  How well-controlled your diabetes is.  Medicines you are taking.  If you have type 1 diabetes:  Check your blood glucose at least 2 times a day.  Also check your blood glucose: ? Before every insulin injection. ? Before and after exercise. ? Between meals. ? 2 hours after a meal. ? Occasionally between 2:00 a.m. and 3:00 a.m., as directed. ? Before potentially dangerous tasks, like driving or using heavy machinery. ? At bedtime.  You may need to check your blood glucose more often, up to 6-10 times a day: ? If you use an insulin pump. ? If you need multiple daily injections (MDI). ? If your diabetes is not well-controlled. ? If you are ill. ? If you have a history of severe hypoglycemia. ? If you have a history of not knowing when your blood glucose is getting low (hypoglycemia unawareness). If you have type 2  diabetes:  If you take insulin or other diabetes medicines, check your blood glucose at least 2 times a day.  If you are on intensive insulin therapy, check your blood glucose at least 4 times a day. Occasionally, you may also need to check between 2:00 a.m. and 3:00 a.m., as directed.  Also check your blood glucose: ? Before and after exercise. ? Before potentially dangerous tasks, like driving or using heavy machinery.  You may need to check your blood glucose more often if: ? Your medicine is being adjusted. ? Your diabetes is not well-controlled. ? You are ill. What is a blood glucose log?  A blood glucose log is a record of your blood glucose readings. It helps you and your health care provider: ? Look for patterns in your blood glucose over time. ? Adjust your diabetes management plan as needed.  Every time you check your blood glucose, write down your result and notes about things that may be affecting your blood glucose, such as your diet and exercise for the day.  Most glucose meters store a record of glucose readings in the meter. Some meters allow you to download your records to a computer. How do I check my blood glucose? Follow these steps to get accurate readings of your blood glucose: Supplies needed   Blood glucose meter.  Test strips for your meter. Each meter has its own strips. You must use the strips that come with your meter.  A needle to prick your finger (lancet). Do not use lancets more than once.  A  device that holds the lancet (lancing device).  A journal or log book to write down your results. Procedure  Wash your hands with soap and water.  Prick the side of your finger (not the tip) with the lancet. Use a different finger each time.  Gently rub the finger until a small drop of blood appears.  Follow instructions that come with your meter for inserting the test strip, applying blood to the strip, and using your blood glucose meter.  Write  down your result and any notes. Alternative testing sites  Some meters allow you to use areas of your body other than your finger (alternative sites) to test your blood.  If you think you may have hypoglycemia, or if you have hypoglycemia unawareness, do not use alternative sites. Use your finger instead.  Alternative sites may not be as accurate as the fingers, because blood flow is slower in these areas. This means that the result you get may be delayed, and it may be different from the result that you would get from your finger.  The most common alternative sites are: ? Forearm. ? Thigh. ? Palm of the hand. Additional tips  Always keep your supplies with you.  If you have questions or need help, all blood glucose meters have a 24-hour hotline number that you can call. You may also contact your health care provider.  After you use a few boxes of test strips, adjust (calibrate) your blood glucose meter by following instructions that came with your meter. This information is not intended to replace advice given to you by your health care provider. Make sure you discuss any questions you have with your health care provider. Document Released: 05/01/2003 Document Revised: 11/16/2015 Document Reviewed: 10/08/2015 Elsevier Interactive Patient Education  2017 [employer].   Follow with Primary MD  [name], MD  and other consultant's as instructed your Hospitalist MD  Please get a complete blood count and chemistry panel checked by your Primary MD at your next visit, and again as instructed by your Primary MD.  Get Medicines reviewed and adjusted: Please take all your medications with you for your next visit with your Primary MD  Laboratory/radiological data: Please request your Primary MD to go over all hospital tests and procedure/radiological results at the follow up, please ask your Primary MD to get all Hospital records sent to his/her office.  In some cases, they will be  blood work, cultures and biopsy results pending at the time of your discharge. Please request that your primary care M.D. follows up on these results.  Also Note the following: If you experience worsening of your admission symptoms, develop shortness of breath, life threatening emergency, suicidal or homicidal thoughts you must seek medical attention immediately by calling 911 or calling your MD immediately  if symptoms less severe.  You must read complete instructions/literature along with all the possible adverse reactions/side effects for all the Medicines you take and that have been prescribed to you. Take any new Medicines after you have completely understood and accpet all the possible adverse reactions/side effects.   Do not drive when taking Pain medications or sleeping medications (Benzodaizepines)  Do not take more than prescribed Pain, Sleep and Anxiety Medications. It is not advisable to combine anxiety,sleep and pain medications without talking with your primary care practitioner  Special Instructions: If you have smoked or chewed Tobacco  in the last 2 yrs please stop smoking, stop any regular Alcohol  and or any Recreational drug use.  Wear Seat belts while driving.  Please note: You were cared for by a hospitalist during your hospital stay. Once you are discharged, your primary care physician will handle any further medical issues. Please note that NO REFILLS for any discharge medications will be authorized once you are discharged, as it is imperative that you return to your primary care physician (or establish a relationship with a primary care physician if you do not have one) for your post hospital discharge needs so that they can reassess your need for medications and monitor your lab values.     Aspirin and Your Heart Aspirin is a medicine that affects the way blood clots. Aspirin can be used to help reduce the risk of blood clots, heart attacks, and other heart-related  problems. Should I take aspirin? Your health care provider will help you determine whether it is safe and beneficial for you to take aspirin daily. Taking aspirin daily may be beneficial if you:  Have had a heart attack or chest pain.  Have undergone open heart surgery such as coronary artery bypass surgery (CABG).  Have had coronary angioplasty.  Have experienced a stroke or transient ischemic attack (TIA).  Have peripheral vascular disease (PVD).  Have chronic heart rhythm problems such as atrial fibrillation.  Are there any risks of taking aspirin daily? Daily use of aspirin can increase your risk of side effects. Some of these include:  Bleeding. Bleeding problems can be minor or serious. An example of a minor problem is a cut that does not stop bleeding. An example of a more serious problem is stomach bleeding or bleeding into the brain. Your risk of bleeding is increased if you are also taking non-steroidal anti-inflammatory medicine (NSAIDs).  Increased bruising.  Upset stomach.  An allergic reaction. People who have nasal polyps have an increased risk of developing an aspirin allergy.  What are some guidelines I should follow when taking aspirin?  Take aspirin only as directed by your health care provider. Make sure you understand how much you should take and what form you should take. The two forms of aspirin are: ? Non-enteric-coated. This type of aspirin does not have a coating and is absorbed quickly. Non-enteric-coated aspirin is usually recommended for people with chest pain. This type of aspirin also comes in a chewable form. ? Enteric-coated. This type of aspirin has a special coating that releases the medicine very slowly. Enteric-coated aspirin causes less stomach upset than non-enteric-coated aspirin. This type of aspirin should not be chewed or crushed.  Drink alcohol in moderation. Drinking alcohol increases your risk of bleeding. When should I seek medical  care?  You have unusual bleeding or bruising.  You have stomach pain.  You have an allergic reaction. Symptoms of an allergic reaction include: ? Hives. ? Itchy skin. ? Swelling of the lips, tongue, or face.  You have ringing in your ears. When should I seek immediate medical care?  Your bowel movements are bloody, dark red, or black in color.  You vomit or cough up blood.  You have blood in your urine.  You cough, wheeze, or feel short of breath. If you have any of the following symptoms, this is an emergency. Do not wait to see if the pain will go away. Get medical help at once. Call your local emergency services (911 in the [place] Do not drive yourself to the hospital.  You have severe chest pain, especially if the pain is crushing or pressure-like and spreads to the arms,  back, neck, or jaw.  You have stroke-like symptoms, such as: ? Loss of vision. ? Difficulty talking. ? Numbness or weakness on one side of your body. ? Numbness or weakness in your arm or leg. ? Not thinking clearly or feeling confused.  This information is not intended to replace advice given to you by your health care provider. Make sure you discuss any questions you have with your health care provider. Document Released: 04/10/2008 Document Revised: 09/05/2015 Document Reviewed: 08/03/2013 Elsevier Interactive Patient Education  2018 [employer].   Chest Wall Pain Chest wall pain is pain in or around the bones and muscles of your chest. Sometimes, an injury causes this pain. Sometimes, the cause may not be known. This pain may take several weeks or longer to get better. Follow these instructions at home: Pay attention to any changes in your symptoms. Take these actions to help with your pain:  Rest as told by your doctor.  Avoid activities that cause pain. Try not to use your chest, belly (abdominal), or side muscles to lift heavy things.  If directed, apply ice to the painful area: ? Put ice  in a plastic bag. ? Place a towel between your skin and the bag. ? Leave the ice on for 20 minutes, 2-3 times per day.  Take over-the-counter and prescription medicines only as told by your doctor.  Do not use tobacco products, including cigarettes, chewing tobacco, and e-cigarettes. If you need help quitting, ask your doctor.  Keep all follow-up visits as told by your doctor. This is important.  Contact a doctor if:  You have a fever.  Your chest pain gets worse.  You have new symptoms. Get help right away if:  You feel sick to your stomach (nauseous) or you throw up (vomit).  You feel sweaty or light-headed.  You have a cough with phlegm (sputum) or you cough up blood.  You are short of breath. This information is not intended to replace advice given to you by your health care provider. Make sure you discuss any questions you have with your health care provider. Document Released: 10/15/2007 Document Revised: 10/04/2015 Document Reviewed: 07/24/2014 Elsevier Interactive Patient Education  2018 [employer].  Dizziness Dizziness is a common problem. It makes you feel unsteady or light-headed. You may feel like you are about to pass out (faint). Dizziness can lead to getting hurt if you stumble or fall. Dizziness can be caused by many things, including:  Medicines.  Not having enough water in your body (dehydration).  Illness.  Follow these instructions at home: Eating and drinking  Drink enough fluid to keep your pee (urine) clear or pale yellow. This helps to keep you from getting dehydrated. Try to drink more clear fluids, such as water.  Do not drink alcohol.  Limit how much caffeine you drink or eat, if your doctor tells you to do that.  Limit how much salt (sodium) you drink or eat, if your doctor tells you to do that. Activity  Avoid making quick movements. ? When you stand up from sitting in a chair, steady yourself until you feel okay. ? In the morning,  first sit up on the side of the bed. When you feel okay, stand slowly while you hold onto something. Do this until you know that your balance is fine.  If you need to stand in one place for a long time, move your legs often. Tighten and relax the muscles in your legs while you are standing.  Do not drive or use heavy machinery if you feel dizzy.  Avoid bending down if you feel dizzy. Place items in your home so you can reach them easily without leaning over. Lifestyle  Do not use any products that contain nicotine or tobacco, such as cigarettes and e-cigarettes. If you need help quitting, ask your doctor.  Try to lower your stress level. You can do this by using methods such as yoga or meditation. Talk with your doctor if you need help. General instructions  Watch your dizziness for any changes.  Take over-the-counter and prescription medicines only as told by your doctor. Talk with your doctor if you think that you are dizzy because of a medicine that you are taking.  Tell a friend or a family member that you are feeling dizzy. If he or she notices any changes in your behavior, have this person call your doctor.  Keep all follow-up visits as told by your doctor. This is important. Contact a doctor if:  Your dizziness does not go away.  Your dizziness or light-headedness gets worse.  You feel sick to your stomach (nauseous).  You have trouble hearing.  You have new symptoms.  You are unsteady on your feet.  You feel like the room is spinning. Get help right away if:  You throw up (vomit) or have watery poop (diarrhea), and you cannot eat or drink anything.  You have trouble: ? Talking. ? Walking. ? Swallowing. ? Using your arms, hands, or legs.  You feel generally weak.  You are not thinking clearly, or you have trouble forming sentences. A friend or family member may notice this.  You have: ? Chest pain. ? Pain in your belly (abdomen). ? Shortness of  breath. ? Sweating.  Your vision changes.  You are bleeding.  You have a very bad headache.  You have neck pain or a stiff neck.  You have a fever. These symptoms may be an emergency. Do not wait to see if the symptoms will go away. Get medical help right away. Call your local emergency services (911 in the [place] Do not drive yourself to the hospital. Summary  Dizziness makes you feel unsteady or light-headed. You may feel like you are about to pass out (faint).  Drink enough fluid to keep your pee (urine) clear or pale yellow. Do not drink alcohol.  Avoid making quick movements if you feel dizzy.  Watch your dizziness for any changes. This information is not intended to replace advice given to you by your health care provider. Make sure you discuss any questions you have with your health care provider. Document Released: 04/17/2011 Document Revised: 05/15/2016 Document Reviewed: 05/15/2016 Elsevier Interactive Patient Education  2017 [place].   Near-Syncope Near-syncope is when you suddenly get weak or dizzy, or you feel like you might pass out (faint). During an episode of near-syncope, you may:  Feel dizzy or light-headed.  Feel sick to your stomach (nauseous).  See all white or all black.  Have cold, clammy skin.  If you passed out, get help right away.Call your local emergency services (911 in the [place] Do not drive yourself to the hospital. Follow these instructions at home: Pay attention to any changes in your symptoms. Take these actions to help with your condition:  Have someone stay with you until you feel stable.  Do not drive, use machinery, or play sports until your doctor says it is okay.  Keep all follow-up visits as told by your doctor.  This is important.  If you start to feel like you might pass out, lie down right away and raise (elevate) your feet above the level of your heart. Breathe deeply and steadily. Wait until all of the symptoms are  gone.  Drink enough fluid to keep your pee (urine) clear or pale yellow.  If you are taking blood pressure or heart medicine, get up slowly and spend many minutes getting ready to sit and then stand. This can help with dizziness.  Take over-the-counter and prescription medicines only as told by your doctor.  Get help right away if:  You have a very bad headache.  You have unusual pain in your chest, tummy, or back.  You are bleeding from your mouth or rectum.  You have black or tarry poop (stool).  You have a very fast or uneven heartbeat (palpitations).  You pass out one time or more than once.  You have jerky movements that you cannot control (seizure).  You are confused.  You have trouble walking.  You are very weak.  You have vision problems. These symptoms may be an emergency. Do not wait to see if the symptoms will go away. Get medical help right away. Call your local emergency services (911 in the [place] Do not drive yourself to the hospital. This information is not intended to replace advice given to you by your health care provider. Make sure you discuss any questions you have with your health care provider. Document Released: 10/15/2007 Document Revised: 10/04/2015 Document Reviewed: 01/10/2015 Elsevier Interactive Patient Education  2017 [employer].   Syncope Syncope is when you lose temporarily pass out (faint). Signs that you may be about to pass out include:  Feeling dizzy or light-headed.  Feeling sick to your stomach (nauseous).  Seeing all white or all black.  Having cold, clammy skin.  If you passed out, get help right away. Call your local emergency services (911 in the [place] Do not drive yourself to the hospital. Follow these instructions at home: Pay attention to any changes in your symptoms. Take these actions to help with your condition:  Have someone stay with you until you feel stable.  Do not drive, use machinery, or play sports  until your doctor says it is okay.  Keep all follow-up visits as told by your doctor. This is important.  If you start to feel like you might pass out, lie down right away and raise (elevate) your feet above the level of your heart. Breathe deeply and steadily. Wait until all of the symptoms are gone.  Drink enough fluid to keep your pee (urine) clear or pale yellow.  If you are taking blood pressure or heart medicine, get up slowly and spend many minutes getting ready to sit and then stand. This can help with dizziness.  Take over-the-counter and prescription medicines only as told by your doctor.  Get help right away if:  You have a very bad headache.  You have unusual pain in your chest, tummy, or back.  You are bleeding from your mouth or rectum.  You have black or tarry poop (stool).  You have a very fast or uneven heartbeat (palpitations).  It hurts to breathe.  You pass out once or more than once.  You have jerky movements that you cannot control (seizure).  You are confused.  You have trouble walking.  You are very weak.  You have vision problems. These symptoms may be an emergency. Do not wait to see if  the symptoms will go away. Get medical help right away. Call your local emergency services (911 in the [place] Do not drive yourself to the hospital. This information is not intended to replace advice given to you by your health care provider. Make sure you discuss any questions you have with your health care provider. Document Released: 10/15/2007 Document Revised: 10/04/2015 Document Reviewed: 01/10/2015 Elsevier Interactive Patient Education  [name].

## 2017-10-16 NOTE — Discharge Summary (Signed)
Physician Discharge Summary  [name] NIO:[id number] DOB: 12-14-1950 DOA: 10/13/2017  PCP: [name], MD  Admit date: 10/13/2017 Discharge date: 10/16/2017  Admitted From: Home  Disposition: Home   Recommendations for Outpatient Follow-up:  1. Follow up with PCP in 1 weeks 2. Please obtain BMP/CBC in one week 3. Please check TSH in 3-4 weeks to assess treatment response 4. Please follow up on the following pending results: final culture data  Home Health: resume home health services  Discharge Condition: STABLE   CODE STATUS: DNR    Brief Hospitalization Summary: Please see all hospital notes, images, labs for full details of the hospitalization.  HPI:    Stephanie Combs  is a 67 y.o. female, with history of coronary artery disease, recent cardiac catheter in January 2019 showed proximal LAD lesion of 95% stenosis and 80% stenosed sidebranch in OST first diagonal, drug-eluting stent with balloon angioplasty of diagonal branch on dual antiplatelet therapy, chronic diastolic heart failure, hypertension, dyslipidemia, chronic kidney disease stage III, type 2 diabetes mellitus, hypothyroidism, syncope, morbid obesity, anemia who came to hospital after patient passed out at home.  Patient says that she was sitting in chair and was reaching over to get something off the floor and then does not recall anything after that.  Patient says that she was on the floor for unknown duration of time and she was very tired and went back to sleep after that.    Patient had syncopal episode in previous admission and was discharged home with Holter monitor.  Patient is currently wearing Holter monitor.  Patient's blood pressure was low at home per EMS.  Her heart rate was in 50s, blood pressure was 80/42.  No history of tongue biting, no bladder or bowel incontinence.  This was unwitnessed episode.  Patient has hypothyroidism, lab work in the ED showed TSH of 199.456.  Patients dose of Synthroid was  changed from 224 mcg to  25 mcg due to chronically suppressed TSH.  Patient says that she has been feeling tired for past 1 month.  Dose of metoprolol also was changed by her cardiologist to 25 mg twice a day.  She denies chest pain, no shortness of breath. No nausea vomiting or diarrhea. No fever or chills. Denies dysuria.  Brief Admission Hx: HelenNorwoodis a85 y.o.female,with history of coronary artery disease, recent cardiac catheter in January 2019 showed proximal LAD lesion of 95% stenosis and 80% stenosed sidebranch in OST first diagonal, drug-eluting stent with balloon angioplasty of diagonal branch on dual antiplatelet therapy, chronic diastolic heart failure, hypertension, dyslipidemia, chronic kidney disease stage III, type 2 diabetes mellitus, hypothyroidism, syncope, morbid obesity, anemia who came to hospital after patient passed out at home. Patient says that she was sitting in chair and was reaching over to get something off the floor and then does not recall anything after that. Patient says that she was on the floor for unknown duration of time and she was very tired and went back to sleep after that.   MDM/Assessment & Plan:    1. Syncope-likely from orthostatic hypotension, patient does have Holter monitor in place. Cardiology has seen patient and do not feel syncope is not from ischemic heart disease but most likely uncontrolled hypothyroidism that is being addressed and she was taken off the beta blockers, started on imdur 30 mg daily.    2. Severe hypothyroidism-patient's TSH is significantly elevated 199.456, will start patient on 100 mcg of Synthroid daily. She will need to follow-up with PCP  to recheck TSH in 3 to 4 weeks  3. Diabetes mellitus type 2 poorly controlled -continue with Levemir and home diabetes treatment program.  Follow up with Dr. Maudie Mercury.    CBG (last 3)  Recent Labs    10/15/17 1614 10/15/17 2100 10/16/17 0806  GLUCAP 202* 236* 180*     4. Acute kidney injury on chronic kidney disease stage III-admission creatinine was 2.09, baseline creatinine is around 1.20.Improving to baseline.  Follow up with PCP.     5. Chronic diastolic CHF-resumed home lasix therapy.    6. Coronary artery disease-stable, continue dual antiplatelet therapy with aspirin Plavix for DES to LAD.  7.  Chest pain - Pt had some atypical chest pain symptoms, we checked EKG, port CXR, and atelectasis seen on CXR, will have her to start incentive spirometry and follow, nitroglycerin ordered as needed for CP symptoms.  No ischemic work up planned by cardiology service.  Troponins normal x 3.  Cardiology started her on imdur 30 mg daily.  Follow up with cardiology service outpatient as scheduled.     DVT Prophylaxis- Lovenox   AM Labs Ordered, also please review Full Orders  Family Communication:Admission, patients condition and plan of care including tests being ordered have been discussed with the patient andher family members at [place] indicate understanding and agree with the plan and Code Status.  Code Status:DNR  Discharge Diagnoses:  Active Problems:   Morbid obesity (HCC)   HTN (hypertension), benign   CKD (chronic kidney disease), stage III (HCC)   Hypothyroidism   Uncontrolled type 2 diabetes mellitus with stage 3 chronic kidney disease (HCC)   Syncope due to orthostatic hypotension   Syncope  Discharge Instructions: Discharge Instructions    Call MD for:  difficulty breathing, headache or visual disturbances   Complete by:  As directed    Call MD for:  extreme fatigue   Complete by:  As directed    Call MD for:  persistant dizziness or light-headedness   Complete by:  As directed    Call MD for:  severe uncontrolled pain   Complete by:  As directed    Diet - low sodium heart healthy   Complete by:  As directed    Increase activity slowly   Complete by:  As directed      Allergies as of 10/16/2017       Reactions   Dilaudid [hydromorphone Hcl] Itching   Actifed Cold-allergy [chlorpheniramine-phenylephrine]    "I was sick and red and it didn't agree with me at all"   Doxycycline Nausea And Vomiting   Other Cough   Pt states she is allergic to ragweed and that she starts coughing and sneezing like crazy   Penicillins Hives, Itching   Tolerates Rocephin Has patient had a PCN reaction causing immediate rash, facial/tongue/throat swelling, SOB or lightheadedness with hypotension: Yes Has patient had a PCN reaction causing severe rash involving mucus membranes or skin necrosis: No Has patient had a PCN reaction that required hospitalization Yes Has patient had a PCN reaction occurring within the last 10 years: No If all of the above answers are "NO", then may proceed with Cephalosporin use.   Reglan [metoclopramide] Itching   Valium [diazepam] Itching   Vistaril [hydroxyzine Hcl] Itching      Medication List    STOP taking these medications   metoprolol tartrate 50 MG tablet Commonly known as:  LOPRESSOR   silver sulfADIAZINE 1 % cream Commonly known as:  SILVADENE   triamcinolone  cream 0.1 % Commonly known as:  KENALOG     TAKE these medications   albuterol (2.5 MG/3ML) 0.083% nebulizer solution Commonly known as:  PROVENTIL Take 2.5 mg by nebulization 2 (two) times daily. What changed:  Another medication with the same name was changed. Make sure you understand how and when to take each.   albuterol 108 (90 Base) MCG/ACT inhaler Commonly known as:  PROAIR HFA Inhale 1 puff into the lungs every 4 (four) hours as needed. For shortness of breath What changed:    how much to take  additional instructions   ALPRAZolam 1 MG tablet Commonly known as:  XANAX Take 1 mg by mouth 3 (three) times daily as needed for anxiety or sleep.   aspirin EC 81 MG tablet Take 81 mg by mouth daily.   atorvastatin 80 MG tablet Commonly known as:  LIPITOR Take 1 tablet (80 mg total) by  mouth daily at 6 PM. What changed:  when to take this   clopidogrel 75 MG tablet Commonly known as:  PLAVIX Take 1 tablet (75 mg total) by mouth daily.   ferrous sulfate 325 (65 FE) MG tablet Take 1 tablet (325 mg total) by mouth daily with breakfast.   FISH OIL PO Take 1 capsule by mouth daily.   furosemide 40 MG tablet Commonly known as:  LASIX Take 1 tablet (40 mg total) by mouth daily.   gabapentin 300 MG capsule Commonly known as:  NEURONTIN Take 1 capsule (300 mg total) by mouth 3 (three) times daily.   Gerhardt's butt cream Crea Apply 1 application topically 3 (three) times daily.   insulin aspart 100 UNIT/ML injection Commonly known as:  novoLOG Inject 0-9 Units into the skin 3 (three) times daily with meals. What changed:    how much to take  additional instructions   insulin detemir 100 UNIT/ML injection Commonly known as:  LEVEMIR Inject 0.52 mLs (52 Units total) into the skin at bedtime.   isosorbide mononitrate 30 MG 24 hr tablet Commonly known as:  IMDUR Take 1 tablet (30 mg total) by mouth daily. Start taking on:  10/17/2017   levothyroxine 100 MCG tablet Commonly known as:  SYNTHROID, LEVOTHROID Take 1 tablet (100 mcg total) by mouth daily at 6 (six) AM. What changed:    medication strength  how much to take   nystatin powder Commonly known as:  MYCOSTATIN/NYSTOP Apply topically 2 (two) times daily.   ondansetron 4 MG tablet Commonly known as:  ZOFRAN Take 4 mg by mouth every 8 (eight) hours as needed for vomiting.   oxyCODONE-acetaminophen 10-325 MG tablet Commonly known as:  PERCOCET Take 1 tablet by mouth every 6 (six) hours as needed for pain.   OXYGEN Inhale 2 L into the lungs continuous.   pantoprazole 40 MG tablet Commonly known as:  PROTONIX Take 1 tablet (40 mg total) by mouth daily.   polyethylene glycol packet Commonly known as:  MIRALAX / GLYCOLAX Take 17 g by mouth daily as needed for mild constipation or moderate  constipation.   SURE COMFORT INS SYR 1CC/28G 28G X 1/2" 1 ML Misc Generic drug:  INS SYRINGE/NEEDLE 1CC/28G USE TO INJECT INSULIN UP TO 4 TIMES DAILY.   tiZANidine 4 MG tablet Commonly known as:  ZANAFLEX Take 8 mg by mouth 2 (two) times daily.   topiramate 25 MG tablet Commonly known as:  TOPAMAX Take 75 mg by mouth daily.   umeclidinium-vilanterol 62.5-25 MCG/INH Aepb Commonly known as:  ANORO ELLIPTA Inhale  1 puff into the lungs daily.   VICTOZA 18 MG/3ML Sopn Generic drug:  liraglutide Inject 1.2 mg into the skin daily.   Vitamin D3 5000 units Caps Take 1 capsule (5,000 Units total) by mouth daily.      Follow-up Information    [name], MD. Schedule an appointment as soon as possible for a visit in 1 week(s).   Specialty:  Internal Medicine Contact information: [address] [place] [address] [phone]        Herminio Commons, MD. Schedule an appointment as soon as possible for a visit in 3 week(s).   Specialty:  Cardiology Contact information: [place] [address] [phone]          Allergies  Allergen Reactions  . Dilaudid [Hydromorphone Hcl] Itching  . Actifed Cold-Allergy [Chlorpheniramine-Phenylephrine]     "I was sick and red and it didn't agree with me at all"  . Doxycycline Nausea And Vomiting  . Other Cough    Pt states she is allergic to ragweed and that she starts coughing and sneezing like crazy  . Penicillins Hives and Itching    Tolerates Rocephin Has patient had a PCN reaction causing immediate rash, facial/tongue/throat swelling, SOB or lightheadedness with hypotension: Yes Has patient had a PCN reaction causing severe rash involving mucus membranes or skin necrosis: No Has patient had a PCN reaction that required hospitalization Yes Has patient had a PCN reaction occurring within the last 10 years: No If all of the above answers are "NO", then may proceed with Cephalosporin use.    . Reglan [Metoclopramide] Itching  . Valium [Diazepam] Itching  . Vistaril [Hydroxyzine Hcl] Itching   Allergies as of 10/16/2017      Reactions   Dilaudid [hydromorphone Hcl] Itching   Actifed Cold-allergy [chlorpheniramine-phenylephrine]    "I was sick and red and it didn't agree with me at all"   Doxycycline Nausea And Vomiting   Other Cough   Pt states she is allergic to ragweed and that she starts coughing and sneezing like crazy   Penicillins Hives, Itching   Tolerates Rocephin Has patient had a PCN reaction causing immediate rash, facial/tongue/throat swelling, SOB or lightheadedness with hypotension: Yes Has patient had a PCN reaction causing severe rash involving mucus membranes or skin necrosis: No Has patient had a PCN reaction that required hospitalization Yes Has patient had a PCN reaction occurring within the last 10 years: No If all of the above answers are "NO", then may proceed with Cephalosporin use.   Reglan [metoclopramide] Itching   Valium [diazepam] Itching   Vistaril [hydroxyzine Hcl] Itching      Medication List    STOP taking these medications   metoprolol tartrate 50 MG tablet Commonly known as:  LOPRESSOR   silver sulfADIAZINE 1 % cream Commonly known as:  SILVADENE   triamcinolone cream 0.1 % Commonly known as:  KENALOG     TAKE these medications   albuterol (2.5 MG/3ML) 0.083% nebulizer solution Commonly known as:  PROVENTIL Take 2.5 mg by nebulization 2 (two) times daily. What changed:  Another medication with the same name was changed. Make sure you understand how and when to take each.   albuterol 108 (90 Base) MCG/ACT inhaler Commonly known as:  PROAIR HFA Inhale 1 puff into the lungs every 4 (four) hours as needed. For shortness of breath What changed:    how much to take  additional instructions   ALPRAZolam 1  MG tablet Commonly known as:  XANAX Take 1 mg by mouth 3 (three) times daily as needed for anxiety or sleep.   aspirin  EC 81 MG tablet Take 81 mg by mouth daily.   atorvastatin 80 MG tablet Commonly known as:  LIPITOR Take 1 tablet (80 mg total) by mouth daily at 6 PM. What changed:  when to take this   clopidogrel 75 MG tablet Commonly known as:  PLAVIX Take 1 tablet (75 mg total) by mouth daily.   ferrous sulfate 325 (65 FE) MG tablet Take 1 tablet (325 mg total) by mouth daily with breakfast.   FISH OIL PO Take 1 capsule by mouth daily.   furosemide 40 MG tablet Commonly known as:  LASIX Take 1 tablet (40 mg total) by mouth daily.   gabapentin 300 MG capsule Commonly known as:  NEURONTIN Take 1 capsule (300 mg total) by mouth 3 (three) times daily.   Gerhardt's butt cream Crea Apply 1 application topically 3 (three) times daily.   insulin aspart 100 UNIT/ML injection Commonly known as:  novoLOG Inject 0-9 Units into the skin 3 (three) times daily with meals. What changed:    how much to take  additional instructions   insulin detemir 100 UNIT/ML injection Commonly known as:  LEVEMIR Inject 0.52 mLs (52 Units total) into the skin at bedtime.   isosorbide mononitrate 30 MG 24 hr tablet Commonly known as:  IMDUR Take 1 tablet (30 mg total) by mouth daily. Start taking on:  10/17/2017   levothyroxine 100 MCG tablet Commonly known as:  SYNTHROID, LEVOTHROID Take 1 tablet (100 mcg total) by mouth daily at 6 (six) AM. What changed:    medication strength  how much to take   nystatin powder Commonly known as:  MYCOSTATIN/NYSTOP Apply topically 2 (two) times daily.   ondansetron 4 MG tablet Commonly known as:  ZOFRAN Take 4 mg by mouth every 8 (eight) hours as needed for vomiting.   oxyCODONE-acetaminophen 10-325 MG tablet Commonly known as:  PERCOCET Take 1 tablet by mouth every 6 (six) hours as needed for pain.   OXYGEN Inhale 2 L into the lungs continuous.   pantoprazole 40 MG tablet Commonly known as:  PROTONIX Take 1 tablet (40 mg total) by mouth daily.    polyethylene glycol packet Commonly known as:  MIRALAX / GLYCOLAX Take 17 g by mouth daily as needed for mild constipation or moderate constipation.   SURE COMFORT INS SYR 1CC/28G 28G X 1/2" 1 ML Misc Generic drug:  INS SYRINGE/NEEDLE 1CC/28G USE TO INJECT INSULIN UP TO 4 TIMES DAILY.   tiZANidine 4 MG tablet Commonly known as:  ZANAFLEX Take 8 mg by mouth 2 (two) times daily.   topiramate 25 MG tablet Commonly known as:  TOPAMAX Take 75 mg by mouth daily.   umeclidinium-vilanterol 62.5-25 MCG/INH Aepb Commonly known as:  ANORO ELLIPTA Inhale 1 puff into the lungs daily.   VICTOZA 18 MG/3ML Sopn Generic drug:  liraglutide Inject 1.2 mg into the skin daily.   Vitamin D3 5000 units Caps Take 1 capsule (5,000 Units total) by mouth daily.       Procedures/Studies: Ct Head Wo Contrast  Result Date: 10/13/2017 CLINICAL DATA:  Syncope and fall. EXAM: CT HEAD WITHOUT CONTRAST TECHNIQUE: Contiguous axial images were obtained from the base of the skull through the vertex without intravenous contrast. COMPARISON:  CT head dated September 02, 2017. FINDINGS: Brain: No evidence of acute infarction, hemorrhage, hydrocephalus, extra-axial collection or mass  lesion/mass effect. Stable atrophy and chronic microvascular ischemic changes. Vascular: No hyperdense vessel or unexpected calcification. Skull: Negative for fracture or focal lesion. Sinuses/Orbits: No acute finding. Other: None. IMPRESSION: 1.  No acute intracranial abnormality. Electronically Signed   By: Titus Dubin M.D.   On: 10/13/2017 21:29   Dg Chest Port 1 View  Result Date: 10/15/2017 CLINICAL DATA:  Chest pain that is in the center and radiating to the left per patient. Patient states that it feels like pressure when patient is holding her breath and becomes painful. Hx of CAD, CHF, COPD, diabetes, HTN. Former smoker. EXAM: PORTABLE CHEST 1 VIEW COMPARISON:  10/13/2017 FINDINGS: Patient is rotated. Heart size is mildly enlarged.  There are no focal consolidations or pleural effusions. Minimal RIGHT LOWER lobe atelectasis. IMPRESSION: 1. Patient rotation. 2. RIGHT LOWER lobe atelectasis. Electronically Signed   By: Nolon Nations M.D.   On: 10/15/2017 12:35   Dg Chest Port 1 View  Result Date: 10/13/2017 CLINICAL DATA:  Syncope and weakness. EXAM: PORTABLE CHEST 1 VIEW COMPARISON:  09/02/2017 FINDINGS: The patient is rotated. Cardiomegaly and pulmonary vascular congestion noted. Chronic RIGHT basilar atelectasis/scarring noted. No definite pleural effusion or pneumothorax. No acute bony abnormalities. IMPRESSION: Cardiomegaly with pulmonary vascular congestion. Electronically Signed   By: Margarette Canada M.D.   On: 10/13/2017 19:50     Subjective: Pt without complaints. Chest pain resolved now.    Discharge Exam: Vitals:   10/16/17 0551 10/16/17 1033  BP: 131/66   Pulse: 66   Resp:    Temp: 98.5 F (36.9 C)   SpO2: 100% 98%   Vitals:   10/15/17 2005 10/15/17 2059 10/16/17 0551 10/16/17 1033  BP:  127/62 131/66   Pulse:  90 66   Resp:      Temp:  98.5 F (36.9 C) 98.5 F (36.9 C)   TempSrc:  Oral Oral   SpO2: 96% 100% 100% 98%  Weight:   129.3 kg (285 lb 0.9 oz)   Height:       General: Pt is alert, awake, not in acute distress Cardiovascular: RRR, S1/S2 +, no rubs, no gallops Respiratory: CTA bilaterally, no wheezing, no rhonchi Abdominal: Soft, NT, ND, bowel sounds + Extremities: no edema, no cyanosis   The results of significant diagnostics from this hospitalization (including imaging, microbiology, ancillary and laboratory) are listed below for reference.     Microbiology: Recent Results (from the past 240 hour(s))  MRSA PCR Screening     Status: Abnormal   Collection Time: 10/14/17  1:04 AM  Result Value Ref Range Status   MRSA by PCR POSITIVE (A) NEGATIVE Final    Comment:        The GeneXpert MRSA Assay (FDA approved for NASAL specimens only), is one component of a comprehensive MRSA  colonization surveillance program. It is not intended to diagnose MRSA infection nor to guide or monitor treatment for MRSA infections. RESULT CALLED TO, READ BACK BY AND VERIFIED WITH: BONNIE STONE 10/14/17 @ 0912 LSCHMIDT Performed at [facility], [address] [place], [place] [address]      Labs: BNP (last 3 results) Recent Labs    01/30/17 1816 04/27/17 1500 05/01/17 1000  BNP 115.6* 188.0* 341.9*   Basic Metabolic Panel: Recent Labs  Lab 10/13/17 1916 10/14/17 0519 10/15/17 0504  NA 136 134* 139  K 4.6 4.1 4.3  CL 102 100* 106  CO2 27 28 28   GLUCOSE 90 177* 226*  BUN 33* 33* 28*  CREATININE 2.09*  1.77* 1.36*  CALCIUM 8.9 8.4* 8.8*  MG  --   --  1.8   Liver Function Tests: Recent Labs  Lab 10/14/17 0519  AST 21  ALT 14  ALKPHOS 73  BILITOT 0.4  PROT 5.7*  ALBUMIN 3.2*   No results for input(s): LIPASE, AMYLASE in the last 168 hours. No results for input(s): AMMONIA in the last 168 hours. CBC: Recent Labs  Lab 10/13/17 1916 10/14/17 0519  WBC 8.1 7.6  HGB 10.2* 10.2*  HCT 33.7* 32.6*  MCV 91.6 90.6  PLT 256 257   Cardiac Enzymes: Recent Labs  Lab 10/13/17 1856 10/13/17 1916 10/15/17 1220 10/15/17 1751 10/15/17 2342  CKTOTAL  --  162  --   --   --   TROPONINI <0.03  --  <0.03 <0.03 <0.03   BNP: Invalid input(s): POCBNP CBG: Recent Labs  Lab 10/15/17 0758 10/15/17 1136 10/15/17 1614 10/15/17 2100 10/16/17 0806  GLUCAP 159* 192* 202* 236* 180*   D-Dimer No results for input(s): DDIMER in the last 72 hours. Hgb A1c Recent Labs    10/13/17 1916  HGBA1C 8.1*   Lipid Profile No results for input(s): CHOL, HDL, LDLCALC, TRIG, CHOLHDL, LDLDIRECT in the last 72 hours. Thyroid function studies Recent Labs    10/13/17 1916  TSH 199.456*   Anemia work up No results for input(s): VITAMINB12, FOLATE, FERRITIN, TIBC, IRON, RETICCTPCT in the last 72 hours. Urinalysis    Component Value Date/Time   COLORURINE YELLOW 10/14/2017  0057   APPEARANCEUR CLEAR 10/14/2017 0057   LABSPEC 1.009 10/14/2017 0057   PHURINE 5.0 10/14/2017 0057   GLUCOSEU NEGATIVE 10/14/2017 0057   HGBUR SMALL (A) 10/14/2017 0057   BILIRUBINUR NEGATIVE 10/14/2017 0057   KETONESUR NEGATIVE 10/14/2017 0057   PROTEINUR NEGATIVE 10/14/2017 0057   UROBILINOGEN 0.2 12/04/2014 0650   NITRITE NEGATIVE 10/14/2017 0057   LEUKOCYTESUR NEGATIVE 10/14/2017 0057   Sepsis Labs Invalid input(s): PROCALCITONIN,  WBC,  LACTICIDVEN Microbiology Recent Results (from the past 240 hour(s))  MRSA PCR Screening     Status: Abnormal   Collection Time: 10/14/17  1:04 AM  Result Value Ref Range Status   MRSA by PCR POSITIVE (A) NEGATIVE Final    Comment:        The GeneXpert MRSA Assay (FDA approved for NASAL specimens only), is one component of a comprehensive MRSA colonization surveillance program. It is not intended to diagnose MRSA infection nor to guide or monitor treatment for MRSA infections. RESULT CALLED TO, READ BACK BY AND VERIFIED WITH: BONNIE STONE 10/14/17 @ 0912 LSCHMIDT Performed at [facility], [address] [place], [place] [address]     Time coordinating discharge: [place]  SIGNED:  Irwin Brakeman, MD  [facility] 10/16/2017, 12:01 PM Pager [phone]  If 7PM-7AM, please contact night-coverage [email or web address] Password [email or web address]

## 2017-10-16 NOTE — Care Management Note (Signed)
Case Management Note  Patient Details  Name: MAELLE SHEAFFER MRN: [id number] Date of Birth: 02-May-1951  Subjective/Objective:   Pt from home, ind with ADL's. She was previously referred to [facility]. They have discharged patient in May and are unable to accept her back d/t poor compliance. This is patients 3rd admission in 6 months. Her THN status is "pending".                  Action/Plan: DC home. Pt would like [place] services at DC. She has chosen [facility] from [facility] provier options. Pt aware HH has 48 hrs to make first visit. [facility] rep, aware of referral and will pull info from chart. [facility] will need to be notified when pt is discharged. This CM see's no recent notes in Epic about communication with [place]. CM wll reach out to [facility] rep to get details about pt's THN status pending.   Expected Discharge Date:     10/16/2017             Expected Discharge Plan:  [place]  In-House Referral:  NA  Discharge planning Services  CM Consult  Post Acute Care Choice:  Home Health Choice offered to:  Patient  DME Arranged:    DME Agency:     HH Arranged:  RN, PT [place] Agency:  [facility] (formerly [employer])  Status of Service:  Completed, signed off  If discussed at [employer], dates discussed:    Additional Comments:  Sherald Barge, RN 10/16/2017, 7:31 AM

## 2017-10-16 NOTE — Progress Notes (Addendum)
Progress Note  Patient Name: Stephanie Combs Date of Encounter: 10/16/2017  Primary Cardiologist: Kate Sable, MD   Subjective   Says she continues to have "cramping and sharp pains" along her entire chest wall which occurs at rest and is relieved with SL NTG. Has been occurring for years by her report. Was just restarted on Imdur yesterday.   Her main concern today is that she had not had a bowel movement since admission. Was restarted on Colace yesterday.   Inpatient Medications    Scheduled Meds: . aspirin EC  81 mg Oral Daily  . atorvastatin  80 mg Oral q1800  . Chlorhexidine Gluconate Cloth  6 each Topical Q0600  . clopidogrel  75 mg Oral Daily  . docusate sodium  200 mg Oral BID  . enoxaparin (LOVENOX) injection  0.5 mg/kg Subcutaneous Q24H  . ferrous sulfate  325 mg Oral Q breakfast  . furosemide  40 mg Oral Daily  . gabapentin  300 mg Oral TID  . insulin aspart  0-9 Units Subcutaneous TID WC  . insulin aspart  5 Units Subcutaneous TID WC  . insulin detemir  52 Units Subcutaneous QHS  . isosorbide mononitrate  30 mg Oral Daily  . levothyroxine  100 mcg Oral QAC breakfast  . mouth rinse  15 mL Mouth Rinse BID  . mupirocin ointment  1 application Nasal BID  . pantoprazole  40 mg Oral Daily  . topiramate  75 mg Oral Daily  . umeclidinium-vilanterol  1 puff Inhalation Daily   Continuous Infusions:  PRN Meds: ALPRAZolam, alum & mag hydroxide-simeth, nitroGLYCERIN, ondansetron **OR** ondansetron (ZOFRAN) IV, oxyCODONE-acetaminophen **AND** oxyCODONE, polyethylene glycol, tiZANidine   Vital Signs    Vitals:   10/15/17 1454 10/15/17 2005 10/15/17 2059 10/16/17 0551  BP: (!) 149/78  127/62 131/66  Pulse: 93  90 66  Resp: 17     Temp: 98.7 F (37.1 C)  98.5 F (36.9 C) 98.5 F (36.9 C)  TempSrc: Oral  Oral Oral  SpO2: 96% 96% 100% 100%  Weight:    285 lb 0.9 oz (129.3 kg)  Height:        Intake/Output Summary (Last 24 hours) at 10/16/2017 0842 Last data  filed at 10/16/2017 0600 Gross per 24 hour  Intake 960 ml  Output 1900 ml  Net -940 ml   Filed Weights   10/14/17 0649 10/15/17 0647 10/16/17 0551  Weight: 279 lb 1.6 oz (126.6 kg) 282 lb 10.1 oz (128.2 kg) 285 lb 0.9 oz (129.3 kg)    Telemetry    NSR, HR in low-50's to 70's. No significant pauses. Occasional PAC's and PVC's.  - Personally Reviewed  ECG    NSR with PAC's, HR 78, with no acute ST changes when compared to prior tracings.   - Personally Reviewed  Physical Exam   General: Obese Caucasian female appearing in no acute distress. Head: Normocephalic, atraumatic.  Neck: Supple without bruits, JVD not elevated. Lungs:  Resp regular and unlabored, CTA without wheezing or rales. Heart: RRR, S1, S2, no S3, S4, or murmur; no rub. Abdomen: Soft, non-tender, non-distended with normoactive bowel sounds. No hepatomegaly. No rebound/guarding. No obvious abdominal masses. Extremities: No clubbing or cyanosis, 1+ pitting edema bilaterally. Distal pedal pulses are 2+ bilaterally. Neuro: Alert and oriented X 3. Moves all extremities spontaneously. Psych: Normal affect.  Labs    Chemistry Recent Labs  Lab 10/13/17 1916 10/14/17 0519 10/15/17 0504  NA 136 134* 139  K 4.6 4.1 4.3  CL 102 100* 106  CO2 27 28 28   GLUCOSE 90 177* 226*  BUN 33* 33* 28*  CREATININE 2.09* 1.77* 1.36*  CALCIUM 8.9 8.4* 8.8*  PROT  --  5.7*  --   ALBUMIN  --  3.2*  --   AST  --  21  --   ALT  --  14  --   ALKPHOS  --  73  --   BILITOT  --  0.4  --   GFRNONAA 23* 29* 39*  GFRAA 27* 33* 46*  ANIONGAP 7 6 5      Hematology Recent Labs  Lab 10/13/17 1916 10/14/17 0519  WBC 8.1 7.6  RBC 3.68* 3.60*  HGB 10.2* 10.2*  HCT 33.7* 32.6*  MCV 91.6 90.6  MCH 27.7 28.3  MCHC 30.3 31.3  RDW 15.2 15.0  PLT 256 257    Cardiac Enzymes Recent Labs  Lab 10/13/17 1856 10/15/17 1220 10/15/17 1751 10/15/17 2342  TROPONINI <0.03 <0.03 <0.03 <0.03   No results for input(s): TROPIPOC in the last  168 hours.   BNPNo results for input(s): BNP, PROBNP in the last 168 hours.   DDimer No results for input(s): DDIMER in the last 168 hours.   Radiology    Dg Chest Port 1 View  Result Date: 10/15/2017 CLINICAL DATA:  Chest pain that is in the center and radiating to the left per patient. Patient states that it feels like pressure when patient is holding her breath and becomes painful. Hx of CAD, CHF, COPD, diabetes, HTN. Former smoker. EXAM: PORTABLE CHEST 1 VIEW COMPARISON:  10/13/2017 FINDINGS: Patient is rotated. Heart size is mildly enlarged. There are no focal consolidations or pleural effusions. Minimal RIGHT LOWER lobe atelectasis. IMPRESSION: 1. Patient rotation. 2. RIGHT LOWER lobe atelectasis. Electronically Signed   By: Nolon Nations M.D.   On: 10/15/2017 12:35    Cardiac Studies   Cardiac Catheterization: 05/2017  Ost LAD lesion is 65% stenosed.  Prox LAD-1 lesion is 70% stenosed with 80% stenosed side branch in Ost 1st Diag.  Prox LAD-2 lesion is 60% stenosed.  LV end diastolic pressure is mildly elevated.  There is no aortic valve stenosis.  Hemodynamic findings consistent with mild pulmonary hypertension.   Patient has essentially severe single/2-vessel disease involving the LAD major diagonal branch with otherwise normal circumflex, ramus and RCA with PDA/PLA.  There is ostial LAD as well as bifurcation LAD having diagonal.  By rights, the patient's best option would be bypass surgery, however I have been told by her primary team that she probably is not a good bypass candidate.  I will therefore discuss the findings with my interventional colleagues to determine if there is an approachable option for bifurcation PCI.  The ostial LAD may be not approachable.  Plan: She will be transferred to [facility] 6 E./stepdown unit. We will start IV heparin 6-8 hours post sheath removal/TR band removal. Continue symptomatic treatment with anginal agents.  Will  determine course of action after discussion with interventional colleagues.  Coronary Stent Intervention: 05/2017  Ost LAD lesion is 55% stenosed.  Prox LAD lesion is 95% stenosed with 80% stenosed side branch in Ost 1st Diag.  A drug-eluting stent was successfully placed using a STENT SYNERGY DES 2.5X20.  Post intervention, there is a 0% residual stenosis.  Post intervention, the side branch was reduced to 20% residual stenosis.   1. Successful PCI with stenting of the LAD with a DES and balloon angioplasty of the diagonal  branch   Plan: continue DAPT for at least one year. Anticipate DC when medically stable.   Patient Profile     67 y.o. female w/ PMH of CAD (s/p DES to [place] angioplasty to [place] 05/2017), chronic diastolic CHF, HTN, HLD, COPD, and Stage 3 CKDwho presented to [facility] ED on 10/13/2017 for evaluation of syncope. Had previously been admitted for similar symptoms in 08/2017 with an event monitor placed at that time.   Assessment & Plan    1. Recurrent Syncopal Events -has experienced two syncopal events over the past few months with an event monitor being placed in 09/2017. The office has not received any notifications of critical results. The official report is pending.  - was initially bradycardiac but this has improved with the discontinuation of Lopressor. Also dehydrated at the time of admission but has received IVF with improvement in her dizziness. Would continue to address her severe hypothyroidism as this is likely playing a significant role as well.   2. CAD -s/p DES to LADand angioplasty to [place] 05/2017. Has chronic chest wall pain which is unchanged in etiology.  - had recurrent chest pain yesterday afternoon and cyclic troponin values were obtained and have remained negative. Repeat EKG shows no acute ischemic changes.  - continue ASA, Plavix, Imdur, and statin therapy. Just restarted on Imdur 30mg  daily and says she was previously unable to  tolerate a higher dosing (was also on Lopressor at that time and experienced associated hypotension). Can consider further titration as an outpatient or the initiation of Ranexa.   3.Chronic Diastolic CHF - the patient has a preserved EF of 60-65% by echocardiogram in 05/2017. Was initially rceiving IVF at the time of admission due to dehydration and her AKI. IVF were discontinued yesterday. Will plan to restart PTA Lasix 40mg  today.   4. Acute on Chronic Stage 3 CKD  -creatinineelevated to2.09(baseline 1.2 - 1.4). Improved to 1.36 on 10/15/2017. Repeat labs have not yet been obtained this morning.   5. Uncontrolled Hypothyroidism -TSHsignificantly elevated to199.45 on admission. Synthroid dosing has been titrated by the admitting team.   For questions or updates, please contact [place] Please consult [email or web address] for contact info under Cardiology/STEMI.   Arna Medici , PA-C 8:42 AM 10/16/2017 Pager: [phone]   Attending note:  I reviewed interval hospital course since assessment yesterday and discussed the case with Ms. Ahmed Prima PA-C.  Ms. Saar did experience recurrent atypical thoracic discomfort, she has had episodes of this over time with plan to continue medical therapy.  Imdur was resumed yesterday and she indicates not being able to tolerate higher doses than 30 mg a day. Troponin I levels are negative x3.   Heart rate has been in the 50 to 70 bpm range since coming off beta-blocker and in the setting of severe hypothyroidism.  Anticipate continued medical therapy for treatment of CAD, ACS is not suspected.  Continue aspirin, Plavix, Imdur, and statin.  Beta-blocker discontinued in the setting of bradycardia.  Satira Sark, M.D., F.A.C.C.

## 2017-10-16 NOTE — Care Management Important Message (Signed)
Important Message  Patient Details  Name: Stephanie Combs MRN: [id number] Date of Birth: 09/10/1950   Medicare Important Message Given:  Yes    Shelda Altes 10/16/2017, 12:22 PM

## 2017-10-16 NOTE — Progress Notes (Signed)
IV removed, 2x2 gauze and paper tape applied to site, patient tolerated well.  Reviewed AVS/ discharge information with patient who verbalized understanding.  Called patient's daughter Silvano Rusk, legal guardian and left message on voicemail to inform that patient will be discharging home. Patient awaiting friend arrival to transport home.

## 2017-10-16 NOTE — Consult Note (Signed)
   [facility] CM Inpatient Consult   10/16/2017  Stephanie Combs [birth year] [id number]   Chart review revealed patient eligible for [place] Management services and post hospital discharge follow up related to a diagnosis of HF, COPD, DM and 3 hospitalizations in the las 6 months. Patient was evaluated for community based chronic disease management services with [facility] care Management Program as a benefit of patient's [employer]. Called into patient's hospital room to explain [place] Management services. Patient endorses her primary care provider to be [name], MD. Patient states her insulin is not affordable and may have to stop taking it. She also stated she was in the process of applying for medicaid but needed assistance getting all of the necessary paperwork together. Verbal consent received for [facility] Care Management. Patient gave [phone] as the best number to reach her. She also gave [address] [place] [address] inside [facility] [place] as her physical address as a PO box is listed in her chart. Patient will receive post hospital discharge calls by [place], pharmacy and social work. [facility] Care Management services do not interfere with or replace any services arranged by the inpatient care management team. Made inpatient RNCM aware [facility] will be following for care management. For additional questions please contact:   Naziah Weckerly RN, [place] Liaison  [phone]) Business Mobile [phone]) Toll free office

## 2017-10-19 ENCOUNTER — Other Ambulatory Visit: Payer: Self-pay | Admitting: *Deleted

## 2017-10-19 ENCOUNTER — Other Ambulatory Visit: Payer: Self-pay

## 2017-10-19 NOTE — Patient Outreach (Signed)
Transition of care call and follow-up on RED EMMI ALERT (does not have discharge papers and does not have all meds) I did not reach the pt this evening but was able to leave a message and request a return call.  I will try and reach her again tomorrow.  Eulah Pont. Myrtie Neither, MSN, [facility] Gerontological Nurse Practitioner [facility] Care Management [phone]

## 2017-10-19 NOTE — Patient Outreach (Addendum)
[facility]) Care Management  10/19/2017  Stephanie Combs [birth year] [id number]  67 year old female referred to [place] Management by [facility] hospital liason.  [place] services requested for medication assistance for insulins.  PMHx includes, but not limited to, hypertension, diastolic CHF, angina, syncope, COPD, Type 2 diabetes mellitus, hypothyroidism and stage III chronic kidneys disease.  Unsucessful outreach attempt #1 to [name].  Left HIPAA compliant voice message requesting return call.  Plan: Outreach attempt  #2 in 3-4 business days.  Send unsuccessful outreach letter to patient.   Joetta Manners, PharmD Clinical Pharmacist [place] [phone]

## 2017-10-20 ENCOUNTER — Other Ambulatory Visit: Payer: Self-pay | Admitting: *Deleted

## 2017-10-20 ENCOUNTER — Encounter: Payer: Self-pay | Admitting: *Deleted

## 2017-10-20 NOTE — Patient Outreach (Addendum)
Unsuccessful outreach for transition of care calls #2. I will send pt an unsuccesdsul letter and I will try to call her again on Friday.  Eulah Pont. Myrtie Neither, MSN, GNP-BC Gerontological Nurse Practitioner [place] Management [phone]  Pt returned my call when I was not able to answer. I have called her back and was not able to reach her again. I have left a message for her to return my call.  Eulah Pont. Myrtie Neither, MSN, [facility] Gerontological Nurse Practitioner [facility] Care Management [phone]

## 2017-10-22 ENCOUNTER — Other Ambulatory Visit: Payer: Self-pay

## 2017-10-22 NOTE — Patient Outreach (Addendum)
  [place] [facility]) Care Management  10/22/2017  Stephanie Combs [birth year] [id number]  67 year old female referred to [place] Management by [facility] hospital liason.  [place] services requested for medication assistance for insulins.  PMHx includes, but not limited to, hypertension, diastolic CHF, angina, syncope, COPD, Type 2 diabetes mellitus, hypothyroidism and stage III chronic kidneys disease.  Unsucessful outreach attempt #2 to [name]. Left HIPAA compliant voice message requesting return call.  Plan: Outreach attempt  #3 in 3-4 business days.   Joetta Manners, PharmD Clinical Pharmacist [place] [phone]

## 2017-10-23 ENCOUNTER — Inpatient Hospital Stay (HOSPITAL_COMMUNITY)
Admission: EM | Admit: 2017-10-23 | Discharge: 2017-10-25 | DRG: 312 | Disposition: A | Discharge status: Home / Self Care | Payer: Medicare HMO | Attending: Internal Medicine | Admitting: Internal Medicine

## 2017-10-23 ENCOUNTER — Encounter (HOSPITAL_COMMUNITY): Payer: Self-pay

## 2017-10-23 ENCOUNTER — Other Ambulatory Visit: Payer: Self-pay

## 2017-10-23 ENCOUNTER — Other Ambulatory Visit: Payer: Self-pay | Admitting: *Deleted

## 2017-10-23 DIAGNOSIS — I951 Orthostatic hypotension: Secondary | ICD-10-CM | POA: Diagnosis present

## 2017-10-23 DIAGNOSIS — I5030 Unspecified diastolic (congestive) heart failure: Secondary | ICD-10-CM | POA: Diagnosis present

## 2017-10-23 DIAGNOSIS — I130 Hypertensive heart and chronic kidney disease with heart failure and stage 1 through stage 4 chronic kidney disease, or unspecified chronic kidney disease: Secondary | ICD-10-CM | POA: Diagnosis present

## 2017-10-23 DIAGNOSIS — Z87891 Personal history of nicotine dependence: Secondary | ICD-10-CM

## 2017-10-23 DIAGNOSIS — N179 Acute kidney failure, unspecified: Secondary | ICD-10-CM | POA: Diagnosis present

## 2017-10-23 DIAGNOSIS — Z885 Allergy status to narcotic agent status: Secondary | ICD-10-CM

## 2017-10-23 DIAGNOSIS — R55 Syncope and collapse: Secondary | ICD-10-CM | POA: Diagnosis present

## 2017-10-23 DIAGNOSIS — Z955 Presence of coronary angioplasty implant and graft: Secondary | ICD-10-CM

## 2017-10-23 DIAGNOSIS — R5381 Other malaise: Secondary | ICD-10-CM

## 2017-10-23 DIAGNOSIS — Z888 Allergy status to other drugs, medicaments and biological substances status: Secondary | ICD-10-CM

## 2017-10-23 DIAGNOSIS — E039 Hypothyroidism, unspecified: Secondary | ICD-10-CM | POA: Diagnosis present

## 2017-10-23 DIAGNOSIS — G8929 Other chronic pain: Secondary | ICD-10-CM | POA: Diagnosis present

## 2017-10-23 DIAGNOSIS — E860 Dehydration: Secondary | ICD-10-CM | POA: Diagnosis present

## 2017-10-23 DIAGNOSIS — J449 Chronic obstructive pulmonary disease, unspecified: Secondary | ICD-10-CM | POA: Diagnosis present

## 2017-10-23 DIAGNOSIS — Z794 Long term (current) use of insulin: Secondary | ICD-10-CM

## 2017-10-23 DIAGNOSIS — I10 Essential (primary) hypertension: Secondary | ICD-10-CM | POA: Diagnosis present

## 2017-10-23 DIAGNOSIS — E871 Hypo-osmolality and hyponatremia: Secondary | ICD-10-CM | POA: Diagnosis present

## 2017-10-23 DIAGNOSIS — I2510 Atherosclerotic heart disease of native coronary artery without angina pectoris: Secondary | ICD-10-CM | POA: Diagnosis present

## 2017-10-23 DIAGNOSIS — N189 Chronic kidney disease, unspecified: Secondary | ICD-10-CM

## 2017-10-23 DIAGNOSIS — W1830XA Fall on same level, unspecified, initial encounter: Secondary | ICD-10-CM | POA: Diagnosis present

## 2017-10-23 DIAGNOSIS — Z881 Allergy status to other antibiotic agents status: Secondary | ICD-10-CM

## 2017-10-23 DIAGNOSIS — N183 Chronic kidney disease, stage 3 (moderate): Secondary | ICD-10-CM | POA: Diagnosis present

## 2017-10-23 DIAGNOSIS — Z7982 Long term (current) use of aspirin: Secondary | ICD-10-CM

## 2017-10-23 DIAGNOSIS — Z7952 Long term (current) use of systemic steroids: Secondary | ICD-10-CM

## 2017-10-23 DIAGNOSIS — Z880 Allergy status to penicillin: Secondary | ICD-10-CM

## 2017-10-23 DIAGNOSIS — D631 Anemia in chronic kidney disease: Secondary | ICD-10-CM | POA: Diagnosis present

## 2017-10-23 DIAGNOSIS — F419 Anxiety disorder, unspecified: Secondary | ICD-10-CM | POA: Diagnosis present

## 2017-10-23 DIAGNOSIS — Z7902 Long term (current) use of antithrombotics/antiplatelets: Secondary | ICD-10-CM

## 2017-10-23 DIAGNOSIS — E1122 Type 2 diabetes mellitus with diabetic chronic kidney disease: Secondary | ICD-10-CM | POA: Diagnosis present

## 2017-10-23 DIAGNOSIS — Z79899 Other long term (current) drug therapy: Secondary | ICD-10-CM

## 2017-10-23 DIAGNOSIS — E119 Type 2 diabetes mellitus without complications: Secondary | ICD-10-CM

## 2017-10-23 DIAGNOSIS — Z9981 Dependence on supplemental oxygen: Secondary | ICD-10-CM

## 2017-10-23 DIAGNOSIS — M549 Dorsalgia, unspecified: Secondary | ICD-10-CM | POA: Diagnosis present

## 2017-10-23 DIAGNOSIS — E861 Hypovolemia: Secondary | ICD-10-CM | POA: Diagnosis present

## 2017-10-23 LAB — CBC WITH DIFFERENTIAL/PLATELET
Basophils Absolute: 0.1 10*3/uL (ref 0.0–0.1)
Basophils Relative: 1 %
Eosinophils Absolute: 0.3 10*3/uL (ref 0.0–0.7)
Eosinophils Relative: 3 %
HCT: 30.9 % — ABNORMAL LOW (ref 36.0–46.0)
Hemoglobin: 9.6 g/dL — ABNORMAL LOW (ref 12.0–15.0)
Lymphocytes Relative: 38 %
Lymphs Abs: 2.9 10*3/uL (ref 0.7–4.0)
MCH: 28.8 pg (ref 26.0–34.0)
MCHC: 31.1 g/dL (ref 30.0–36.0)
MCV: 92.8 fL (ref 78.0–100.0)
Monocytes Absolute: 0.6 10*3/uL (ref 0.1–1.0)
Monocytes Relative: 9 %
Neutro Abs: 3.7 10*3/uL (ref 1.7–7.7)
Neutrophils Relative %: 49 %
Platelets: 268 10*3/uL (ref 150–400)
RBC: 3.33 MIL/uL — ABNORMAL LOW (ref 3.87–5.11)
RDW: 15.9 % — ABNORMAL HIGH (ref 11.5–15.5)
WBC: 7.6 10*3/uL (ref 4.0–10.5)

## 2017-10-23 LAB — URINALYSIS, ROUTINE W REFLEX MICROSCOPIC
Glucose, UA: NEGATIVE mg/dL
Hgb urine dipstick: NEGATIVE
Ketones, ur: 5 mg/dL — AB
Leukocytes, UA: NEGATIVE
Nitrite: NEGATIVE
Protein, ur: NEGATIVE mg/dL
Specific Gravity, Urine: 1.02 (ref 1.005–1.030)
pH: 5 (ref 5.0–8.0)

## 2017-10-23 MED ORDER — SODIUM CHLORIDE 0.9 % IV BOLUS
500.0000 mL | Freq: Once | INTRAVENOUS | Status: DC
  Administered 2017-10-23: 500 mL via INTRAVENOUS

## 2017-10-23 MED ORDER — SODIUM CHLORIDE 0.9 % IV BOLUS
1000.0000 mL | Freq: Once | INTRAVENOUS | Status: AC
  Administered 2017-10-23: 1000 mL via INTRAVENOUS

## 2017-10-23 NOTE — ED Notes (Signed)
Pt tolerated fluid challenge and passed without complication.

## 2017-10-23 NOTE — ED Provider Notes (Signed)
[facility] EMERGENCY DEPARTMENT Provider Note   CSN: [id number] Arrival date & time: 10/23/17  2046     History   Chief Complaint Chief Complaint  Patient presents with  . Fall    HPI Stephanie Combs is a 67 y.o. female.  HPI   The patient presents for evaluation of fall to the floor at home.  She was reportedly sitting in her chair and suddenly fell to the floor.  It is estimated that she was on the floor for about 45 minutes prior to arrival of EMS.  She had a similar fall about a week ago and was hospitalized after that.  She has had several hospitalizations recently, and outpatient efforts to control her recurrent admissions have been undertaken with only partial success.  The patient states that she was sitting in her chair when she suddenly fell, passed out, and awoke on the floor.  She states that there is no way she can go back home because she is worried that she will continue to fall.  She states that the same thing happened last week when she was admitted to the hospital.  She states that after she fell tonight she developed chest pain and vomited several times.  Prior to that she has been able to eat well, and take her usual medications.  She states that she is following the directions for change in medications, which were undertaken during the recent hospitalization.  These include lowering dose of Imdur and Lopressor.  She denies fever, chills, cough, new headache or back pain.  There are no other no modifying factors.  Past Medical History:  Diagnosis Date  . Anxiety   . CAD (coronary artery disease)    a. s/p DES to LAD and angioplasty to D1 in 05/2017  . CHF (congestive heart failure) (HCC)    Diastolic  . Chronic back pain   . COPD (chronic obstructive pulmonary disease) ([place])   . Degenerative disk disease   . Diabetes mellitus without complication ([place])   . Hypertension   . Hypothyroidism   . On home O2    2.5 L N/C prn  . Pedal edema     Patient Active  Problem List   Diagnosis Date Noted  . Bradycardia   . Syncope 10/13/2017  . Syncope due to orthostatic hypotension 09/04/2017  . Sinus arrhythmia 09/04/2017  . Obesity, Class III, BMI 40-49.9 (morbid obesity) ([place]) 09/04/2017  . Syncope and collapse 09/02/2017  . Abnormal nuclear stress test 05/19/2017  . Atypical angina ([place]) 05/19/2017  . Edema 05/15/2017  . Skin ulcer ([place]) 01/30/2017  . Tinea cruris 10/10/2016  . Vitamin D deficiency 08/19/2016  . Personal history of noncompliance with medical treatment, presenting hazards to health 06/17/2016  . Left hand weakness 12/06/2015  . CAP (community acquired pneumonia) 07/25/2015  . Pressure ulcer 05/02/2015  . Hyponatremia 05/01/2015  . Acute-on-chronic kidney injury ([place]) 05/01/2015  . Uncontrolled type 2 diabetes mellitus with stage 3 chronic kidney disease ([place]) 05/01/2015  . COPD exacerbation ([place]) 04/30/2015  . Cellulitis of foot, right 03/24/2015  . Elevated d-dimer 03/24/2015  . Peripheral edema 12/04/2014  . AKI (acute kidney injury) ([place]) 12/04/2014  . Bilateral edema of lower extremity   . Diabetic ulcer of right great toe ([place])   . Foot ulcer due to secondary DM ([place]) 11/07/2014  . Diabetic ulcer of right foot ([place]) 11/07/2014  . Edema of lower extremity 05/27/2013  . Headache 05/26/2013  . CKD (chronic kidney disease), stage  III ([place]) 04/14/2013  . Leukocytosis 04/14/2013  . Anemia in CKD (chronic kidney disease) 04/14/2013  . Chest pain 04/14/2013  . Dyspnea 04/14/2013  . Chronic diastolic CHF (congestive heart failure) ([place])   . Hypothyroidism   . COPD (chronic obstructive pulmonary disease) ([place]) 08/07/2012  . Morbid obesity ([place]) 08/07/2012  . DM type 2 (diabetes mellitus, type 2) ([place]) 08/07/2012  . HTN (hypertension), benign 08/07/2012  . Hypothyroidism (acquired) 08/07/2012    Past Surgical History:  Procedure Laterality Date  . CORONARY STENT INTERVENTION N/A 05/22/2017   Procedure: CORONARY STENT  INTERVENTION;  Surgeon: Martinique, Peter M, MD;  Location: [facility];  Service: Cardiovascular;  Laterality: N/A;  . HERNIA REPAIR    . RIGHT/LEFT HEART CATH AND CORONARY ANGIOGRAPHY N/A 05/19/2017   Procedure: RIGHT/LEFT HEART CATH AND CORONARY ANGIOGRAPHY;  Surgeon: Leonie Man, MD;  Location: [facility];  Service: Cardiovascular;  Laterality: N/A;  . TUBAL LIGATION       OB History   None      Home Medications    Prior to Admission medications   Medication Sig Start Date End Date Taking? Authorizing Provider  ALPRAZolam Duanne Moron) 1 MG tablet Take 1 mg by mouth 3 (three) times daily as needed for anxiety or sleep.  01/05/17  Yes [provider]  aspirin EC 81 MG tablet Take 81 mg by mouth daily.   Yes [provider]  atorvastatin (LIPITOR) 80 MG tablet Take 1 tablet (80 mg total) by mouth daily at 6 PM. Patient taking differently: Take 80 mg by mouth every morning.  05/26/17  Yes [name], MD  Cholecalciferol (VITAMIN D3) 5000 units CAPS Take 1 capsule (5,000 Units total) by mouth daily. 08/19/16  Yes Cassandria Anger, MD  clopidogrel (PLAVIX) 75 MG tablet Take 1 tablet (75 mg total) by mouth daily. 05/26/17  Yes [name], MD  ferrous sulfate 325 (65 FE) MG tablet Take 1 tablet (325 mg total) by mouth daily with breakfast. 02/05/17  Yes [name], MD  furosemide (LASIX) 40 MG tablet Take 1 tablet (40 mg total) by mouth daily. 10/16/17  Yes Johnson, Clanford L, MD  gabapentin (NEURONTIN) 300 MG capsule Take 1 capsule (300 mg total) by mouth 3 (three) times daily. 02/05/17  Yes [name], MD  insulin aspart (NOVOLOG) 100 UNIT/ML injection Inject 0-9 Units into the skin 3 (three) times daily with meals. Patient taking differently: Inject 1-18 Units into the skin 3 (three) times daily with meals. Per sliding scale 05/26/17  Yes [name], MD  insulin detemir (LEVEMIR) 100 UNIT/ML injection Inject 0.52 mLs (52 Units total) into the skin at bedtime.  05/26/17  Yes [name], MD  levothyroxine (SYNTHROID, LEVOTHROID) 100 MCG tablet Take 1 tablet (100 mcg total) by mouth daily at 6 (six) AM. 10/16/17 11/15/17 Yes Johnson, Clanford L, MD  Omega-3 Fatty Acids (FISH OIL PO) Take 1 capsule by mouth daily.    Yes [provider]  pantoprazole (PROTONIX) 40 MG tablet Take 1 tablet (40 mg total) by mouth daily. 02/05/17  Yes [name], MD  albuterol (PROAIR HFA) 108 (90 BASE) MCG/ACT inhaler Inhale 1 puff into the lungs every 4 (four) hours as needed. For shortness of breath Patient taking differently: Inhale 2 puffs into the lungs every 4 (four) hours as needed. For shortness of breath 05/30/13   Kinnie Feil, MD  albuterol (PROVENTIL) (2.5 MG/3ML) 0.083% nebulizer solution Take 2.5 mg by nebulization 2 (two) times  daily.    [provider]  Hydrocortisone (GERHARDT'S BUTT CREAM) CREA Apply 1 application topically 3 (three) times daily. 05/26/17   [name], MD  isosorbide mononitrate (IMDUR) 30 MG 24 hr tablet Take 1 tablet (30 mg total) by mouth daily. 10/17/17 11/16/17  Johnson, Clanford L, MD  nystatin (MYCOSTATIN/NYSTOP) powder Apply topically 2 (two) times daily. 10/10/16   Eber Jones, MD  ondansetron (ZOFRAN) 4 MG tablet Take 4 mg by mouth every 8 (eight) hours as needed for vomiting.  01/01/17   [provider]  oxyCODONE-acetaminophen (PERCOCET) 10-325 MG tablet Take 1 tablet by mouth every 6 (six) hours as needed for pain.    [provider]  OXYGEN Inhale 2 L into the lungs continuous.    [provider]  polyethylene glycol (MIRALAX / GLYCOLAX) packet Take 17 g by mouth daily as needed for mild constipation or moderate constipation. 10/16/17   Johnson, Clanford L, MD  SURE COMFORT INS SYR 1CC/28G 28G X 1/2" 1 ML MISC USE TO INJECT INSULIN UP TO 4 TIMES DAILY. 03/06/17   Cassandria Anger, MD  tiZANidine (ZANAFLEX) 4 MG tablet Take 8 mg by mouth 2 (two) times daily.  01/10/17   [provider]  topiramate (TOPAMAX) 25 MG tablet Take 75 mg by mouth daily.  01/02/17   [provider]  umeclidinium-vilanterol (ANORO ELLIPTA) 62.5-25 MCG/INH AEPB Inhale 1 puff into the lungs daily. 05/26/17   [name], MD  VICTOZA 18 MG/3ML SOPN Inject 1.2 mg into the skin daily.  01/01/17   [provider]    Family History Family History  Problem Relation Age of Onset  . Stroke Mother   . Heart attack Father   . Diabetes Brother   . Cerebral palsy Brother   . Pneumonia Brother   . Diabetes Other   . Heart attack Other     Social History Social History   Tobacco Use  . Smoking status: Former Smoker    Packs/day: 0.25    Types: Cigarettes    Last attempt to quit: 06/27/2015    Years since quitting: 2.3  . Smokeless tobacco: Never Used  Substance Use Topics  . Alcohol use: No    Alcohol/week: 0.0 oz  . Drug use: No     Allergies   Dilaudid [hydromorphone hcl]; Actifed cold-allergy [chlorpheniramine-phenylephrine]; Doxycycline; Other; Penicillins; Reglan [metoclopramide]; Valium [diazepam]; and Vistaril [hydroxyzine hcl]   Review of Systems Review of Systems  All other systems reviewed and are negative.    Physical Exam Updated Vital Signs BP 90/72 (BP Location: Right Arm)   Pulse (!) 49   Temp (!) 97.4 F (36.3 C) (Oral)   Resp 14   Ht 5\' 6"  (1.676 m)   Wt 129.3 kg (285 lb)   SpO2 100%   BMI 46.00 kg/m   Physical Exam  Constitutional: She is oriented to person, place, and time. She appears well-developed.  Morbidly obese, appears older than stated age  HENT:  Head: Normocephalic and atraumatic.  Somewhat dry oral mucous membranes  Eyes: Pupils are equal, round, and reactive to light. Conjunctivae and EOM are normal.  Neck: Normal range of motion and phonation normal. Neck supple.  Cardiovascular: Normal rate and regular rhythm.  Pulmonary/Chest: Effort normal and breath sounds normal. She exhibits no tenderness.  Abdominal: Soft.  She exhibits no distension. There is no tenderness. There is no guarding.  Musculoskeletal: Normal range of motion. She exhibits edema (Legs bilaterally.  Difficult to assess  for edema versus adiposity.).  Neurological: She is alert and oriented to person, place, and time. She exhibits normal muscle tone.  Skin: Skin is warm and dry.  Psychiatric: She has a normal mood and affect. Her behavior is normal. Judgment and thought content normal.  Nursing note and vitals reviewed.    ED Treatments / Results  Labs (all labs ordered are listed, but only abnormal results are displayed) Labs Reviewed - No data to display  EKG None  Radiology No results found.  Procedures Procedures (including critical care time)  Medications Ordered in ED Medications - No data to display   Initial Impression / Assessment and Plan / ED Course  I have reviewed the triage vital signs and the nursing notes.  Pertinent labs & imaging results that were available during my care of the patient were reviewed by me and considered in my medical decision making (see chart for details).  Clinical Course as of Oct 23 2324  Fri Oct 23, 2017  2147 Patient is clinically dehydrated, and likely needs fluids because of hypotension.  She does not have heart failure.  Ejection fraction 65% during catheterization January 2019.  Also at that time she did receive cardiac stenting.  She has other coronary lesions which could not be treated.  IV fluid bolus ordered to improve status.   [EW]  2150 The patient is requesting to be admitted because she is worried that she will fall again at home.   [EW]  2230 Blood pressure improved after IV fluids  BP(!): 103/55 [EW]  2323 Normal except hemoglobin low 9.6  CBC with Differential(!) [EW]    Clinical Course User Index [EW] Daleen Bo, MD     Patient Vitals for the past 24 hrs:  BP Temp Temp src Pulse Resp SpO2 Height Weight  10/23/17 2130 (!) 90/44 - - - 18 - - -    10/23/17 2055 - - - - - - 5\' 6"  (1.676 m) 129.3 kg (285 lb)  10/23/17 2053 90/72 (!) 97.4 F (36.3 C) Oral (!) 49 14 100 % - -   10:20 PM Reevaluation with update and discussion. After initial assessment and treatment, an updated evaluation reveals she is comfortable texting and talking on her phone.Daleen Bo   Medical Decision Making: Apparent recurrent syncope, with nonspecific complaints.  Patient has had frequent hospitalizations recently.  Blood pressure improved after IV fluids were administered.  Screening evaluation undertaken for signs of serious illness or requiring interventions.  I suspect that the patient is not thriving and may need to be placed.  This can potentially be done from the emergency department to avoid hospitalization again.  CRITICAL CARE-no Performed by: Daleen Bo   Nursing Notes Reviewed/ Care Coordinated Applicable Imaging Reviewed Interpretation of Laboratory Data incorporated into ED treatment   Plan-disposition per oncoming provider team, following completion of treatment and IV fluid administration.  She will likely need social work consultation as soon as possible perhaps tomorrow morning in the emergency department.    Final Clinical Impressions(s) / ED Diagnoses   Final diagnoses:  Syncope, unspecified syncope type  [facility]    ED Discharge Orders    None       Daleen Bo, MD 10/23/17 2327

## 2017-10-23 NOTE — Patient Outreach (Addendum)
[facility]) Care Management  10/23/2017  Stephanie Combs Oct 20, 1950 [id number]   First outreach attempt to patient regarding social work referral.  BSW left [employer].  Will make second attempt within four business days. Unsuccessful outreach letter mailed.    [name], BSW Social Worker [phone]

## 2017-10-23 NOTE — ED Triage Notes (Signed)
Pt states she fell at home and was on the floor for 30-45 minutes before EMS arrived. Pt states she was sitting in her chair at home and suddenly fell to the floor. Pt c/o headache, back pain and extremities feel heavy.

## 2017-10-23 NOTE — ED Notes (Signed)
Pt provided snack and beverage

## 2017-10-23 NOTE — Patient Outreach (Signed)
Transition of care call. Pt answered but unable to talk at the moment, asks if I will call back. I assured her I will call her back later this afternoon.  Eulah Pont. Myrtie Neither, MSN, GNP-BC Gerontological Nurse Practitioner [facility] Care Management [phone]  Called pt back as requested and a home health nurse is with her right now. I requested if I could schedule a home visit with her and she agreed to an appointment on Monday, June 17th at 1:00 pm.  Kayleen Memos C. Myrtie Neither, MSN, [facility] Gerontological Nurse Practitioner [facility] Care Management [phone]

## 2017-10-24 ENCOUNTER — Inpatient Hospital Stay (HOSPITAL_COMMUNITY): Payer: Medicare HMO

## 2017-10-24 ENCOUNTER — Encounter (HOSPITAL_COMMUNITY): Payer: Self-pay | Admitting: Family Medicine

## 2017-10-24 DIAGNOSIS — Z880 Allergy status to penicillin: Secondary | ICD-10-CM | POA: Diagnosis not present

## 2017-10-24 DIAGNOSIS — Z7982 Long term (current) use of aspirin: Secondary | ICD-10-CM | POA: Diagnosis not present

## 2017-10-24 DIAGNOSIS — Z794 Long term (current) use of insulin: Secondary | ICD-10-CM

## 2017-10-24 DIAGNOSIS — E860 Dehydration: Secondary | ICD-10-CM | POA: Diagnosis present

## 2017-10-24 DIAGNOSIS — I10 Essential (primary) hypertension: Secondary | ICD-10-CM

## 2017-10-24 DIAGNOSIS — Z87891 Personal history of nicotine dependence: Secondary | ICD-10-CM | POA: Diagnosis not present

## 2017-10-24 DIAGNOSIS — N183 Chronic kidney disease, stage 3 (moderate): Secondary | ICD-10-CM | POA: Diagnosis present

## 2017-10-24 DIAGNOSIS — Z885 Allergy status to narcotic agent status: Secondary | ICD-10-CM | POA: Diagnosis not present

## 2017-10-24 DIAGNOSIS — G8929 Other chronic pain: Secondary | ICD-10-CM | POA: Diagnosis present

## 2017-10-24 DIAGNOSIS — E1122 Type 2 diabetes mellitus with diabetic chronic kidney disease: Secondary | ICD-10-CM | POA: Diagnosis present

## 2017-10-24 DIAGNOSIS — Z7902 Long term (current) use of antithrombotics/antiplatelets: Secondary | ICD-10-CM | POA: Diagnosis not present

## 2017-10-24 DIAGNOSIS — E039 Hypothyroidism, unspecified: Secondary | ICD-10-CM

## 2017-10-24 DIAGNOSIS — I5030 Unspecified diastolic (congestive) heart failure: Secondary | ICD-10-CM | POA: Diagnosis present

## 2017-10-24 DIAGNOSIS — M549 Dorsalgia, unspecified: Secondary | ICD-10-CM | POA: Diagnosis present

## 2017-10-24 DIAGNOSIS — J438 Other emphysema: Secondary | ICD-10-CM | POA: Diagnosis not present

## 2017-10-24 DIAGNOSIS — N179 Acute kidney failure, unspecified: Secondary | ICD-10-CM

## 2017-10-24 DIAGNOSIS — J449 Chronic obstructive pulmonary disease, unspecified: Secondary | ICD-10-CM | POA: Diagnosis present

## 2017-10-24 DIAGNOSIS — I340 Nonrheumatic mitral (valve) insufficiency: Secondary | ICD-10-CM

## 2017-10-24 DIAGNOSIS — I951 Orthostatic hypotension: Secondary | ICD-10-CM | POA: Diagnosis present

## 2017-10-24 DIAGNOSIS — Z9981 Dependence on supplemental oxygen: Secondary | ICD-10-CM | POA: Diagnosis not present

## 2017-10-24 DIAGNOSIS — N182 Chronic kidney disease, stage 2 (mild): Secondary | ICD-10-CM | POA: Diagnosis not present

## 2017-10-24 DIAGNOSIS — F419 Anxiety disorder, unspecified: Secondary | ICD-10-CM | POA: Diagnosis present

## 2017-10-24 DIAGNOSIS — W1830XA Fall on same level, unspecified, initial encounter: Secondary | ICD-10-CM | POA: Diagnosis present

## 2017-10-24 DIAGNOSIS — D631 Anemia in chronic kidney disease: Secondary | ICD-10-CM | POA: Diagnosis present

## 2017-10-24 DIAGNOSIS — E861 Hypovolemia: Secondary | ICD-10-CM | POA: Diagnosis present

## 2017-10-24 DIAGNOSIS — I2510 Atherosclerotic heart disease of native coronary artery without angina pectoris: Secondary | ICD-10-CM | POA: Diagnosis present

## 2017-10-24 DIAGNOSIS — Z79899 Other long term (current) drug therapy: Secondary | ICD-10-CM | POA: Diagnosis not present

## 2017-10-24 DIAGNOSIS — E871 Hypo-osmolality and hyponatremia: Secondary | ICD-10-CM

## 2017-10-24 DIAGNOSIS — I130 Hypertensive heart and chronic kidney disease with heart failure and stage 1 through stage 4 chronic kidney disease, or unspecified chronic kidney disease: Secondary | ICD-10-CM | POA: Diagnosis present

## 2017-10-24 DIAGNOSIS — R55 Syncope and collapse: Secondary | ICD-10-CM | POA: Diagnosis present

## 2017-10-24 LAB — BASIC METABOLIC PANEL
BUN: 30 mg/dL — ABNORMAL HIGH (ref 6–20)
Calcium: 7.9 mg/dL — ABNORMAL LOW (ref 8.9–10.3)
Chloride: 105 mmol/L (ref 101–111)
Creatinine, Ser: 1.56 mg/dL — ABNORMAL HIGH (ref 0.44–1.00)
GFR calc Af Amer: 39 mL/min — ABNORMAL LOW (ref >60)
GFR calc non Af Amer: 33 mL/min — ABNORMAL LOW (ref >60)
Glucose, Bld: 163 mg/dL — ABNORMAL HIGH (ref 65–99)
Potassium: 4.1 mmol/L (ref 3.5–5.1)
Sodium: 138 mmol/L (ref 135–145)

## 2017-10-24 LAB — CBC
HCT: 30 % — ABNORMAL LOW (ref 36.0–46.0)
Hemoglobin: 9.3 g/dL — ABNORMAL LOW (ref 12.0–15.0)
MCH: 28.6 pg (ref 26.0–34.0)
MCHC: 31 g/dL (ref 30.0–36.0)
MCV: 92.3 fL (ref 78.0–100.0)
Platelets: 254 10*3/uL (ref 150–400)
RBC: 3.25 MIL/uL — ABNORMAL LOW (ref 3.87–5.11)
RDW: 15.9 % — ABNORMAL HIGH (ref 11.5–15.5)
WBC: 7.7 10*3/uL (ref 4.0–10.5)

## 2017-10-24 LAB — GLUCOSE, CAPILLARY
Glucose-Capillary: 120 mg/dL — ABNORMAL HIGH (ref 65–99)
Glucose-Capillary: 145 mg/dL — ABNORMAL HIGH (ref 65–99)
Glucose-Capillary: 200 mg/dL — ABNORMAL HIGH (ref 65–99)
Glucose-Capillary: 204 mg/dL — ABNORMAL HIGH (ref 65–99)
Glucose-Capillary: 209 mg/dL — ABNORMAL HIGH (ref 65–99)

## 2017-10-24 LAB — COMPREHENSIVE METABOLIC PANEL
ALT: 20 U/L (ref 14–54)
AST: 27 U/L (ref 15–41)
Albumin: 3.9 g/dL (ref 3.5–5.0)
Alkaline Phosphatase: 47 U/L (ref 38–126)
BUN: 25 mg/dL — ABNORMAL HIGH (ref 6–20)
Calcium: 8.5 mg/dL — ABNORMAL LOW (ref 8.9–10.3)
Chloride: 104 mmol/L (ref 101–111)
Creatinine, Ser: 1.76 mg/dL — ABNORMAL HIGH (ref 0.44–1.00)
GFR calc Af Amer: 33 mL/min — ABNORMAL LOW (ref >60)
GFR calc non Af Amer: 29 mL/min — ABNORMAL LOW (ref >60)
Glucose, Bld: 113 mg/dL — ABNORMAL HIGH (ref 65–99)
Potassium: 3.8 mmol/L (ref 3.5–5.1)
Sodium: 133 mmol/L — ABNORMAL LOW (ref 135–145)
Total Bilirubin: 0.5 mg/dL (ref 0.3–1.2)
Total Protein: 6.7 g/dL (ref 6.5–8.1)

## 2017-10-24 LAB — RAPID URINE DRUG SCREEN, HOSP PERFORMED
Amphetamines: NOT DETECTED
Benzodiazepines: POSITIVE — AB
Cocaine: NOT DETECTED
Opiates: NOT DETECTED
Tetrahydrocannabinol: NOT DETECTED

## 2017-10-24 LAB — ECHOCARDIOGRAM COMPLETE
Height: 66 in
Weight: 4649.06 oz

## 2017-10-24 LAB — ETHANOL: Alcohol, Ethyl (B): <10 mg/dL (ref <10)

## 2017-10-24 LAB — TSH: TSH: 173.254 u[IU]/mL — ABNORMAL HIGH (ref 0.350–4.500)

## 2017-10-24 LAB — TROPONIN I: Troponin I: <0.03 ng/mL (ref <0.03)

## 2017-10-24 LAB — MAGNESIUM: Magnesium: 2.1 mg/dL (ref 1.7–2.4)

## 2017-10-24 MED ORDER — ASPIRIN EC 81 MG PO TBEC
81.0000 mg | DELAYED_RELEASE_TABLET | Freq: Every day | ORAL | Status: DC
  Administered 2017-10-24 – 2017-10-25 (×2): 81 mg via ORAL
  Filled 2017-10-24 (×2): qty 1

## 2017-10-24 MED ORDER — INSULIN GLARGINE 100 UNIT/ML ~~LOC~~ SOLN
35.0000 [IU] | Freq: Every day | SUBCUTANEOUS | Status: DC
  Filled 2017-10-24 (×2): qty 0.35

## 2017-10-24 MED ORDER — INSULIN ASPART 100 UNIT/ML ~~LOC~~ SOLN
0.0000 [IU] | Freq: Every day | SUBCUTANEOUS | Status: DC
  Administered 2017-10-24: 2 [IU] via SUBCUTANEOUS

## 2017-10-24 MED ORDER — ISOSORBIDE MONONITRATE ER 30 MG PO TB24
30.0000 mg | ORAL_TABLET | Freq: Every day | ORAL | Status: DC
  Administered 2017-10-24 – 2017-10-25 (×2): 30 mg via ORAL
  Filled 2017-10-24 (×2): qty 1

## 2017-10-24 MED ORDER — OXYCODONE-ACETAMINOPHEN 5-325 MG PO TABS
2.0000 | ORAL_TABLET | Freq: Four times a day (QID) | ORAL | Status: DC | PRN
  Administered 2017-10-24 – 2017-10-25 (×5): 2 via ORAL
  Filled 2017-10-24 (×5): qty 2

## 2017-10-24 MED ORDER — CLOPIDOGREL BISULFATE 75 MG PO TABS
75.0000 mg | ORAL_TABLET | Freq: Every day | ORAL | Status: DC
  Administered 2017-10-24 – 2017-10-25 (×2): 75 mg via ORAL
  Filled 2017-10-24 (×2): qty 1

## 2017-10-24 MED ORDER — ONDANSETRON HCL 4 MG PO TABS: 4.0000 mg | ORAL_TABLET | Freq: Four times a day (QID) | ORAL | Status: DC | PRN

## 2017-10-24 MED ORDER — HEPARIN SODIUM (PORCINE) 5000 UNIT/ML IJ SOLN
5000.0000 [IU] | Freq: Three times a day (TID) | INTRAMUSCULAR | Status: DC
  Administered 2017-10-24 – 2017-10-25 (×4): 5000 [IU] via SUBCUTANEOUS
  Filled 2017-10-24 (×4): qty 1

## 2017-10-24 MED ORDER — INSULIN ASPART 100 UNIT/ML ~~LOC~~ SOLN
0.0000 [IU] | Freq: Three times a day (TID) | SUBCUTANEOUS | Status: DC
  Administered 2017-10-24: 3 [IU] via SUBCUTANEOUS
  Administered 2017-10-24: 1 [IU] via SUBCUTANEOUS
  Administered 2017-10-24: 2 [IU] via SUBCUTANEOUS
  Administered 2017-10-25: 3 [IU] via SUBCUTANEOUS
  Administered 2017-10-25: 2 [IU] via SUBCUTANEOUS

## 2017-10-24 MED ORDER — INSULIN DETEMIR 100 UNIT/ML ~~LOC~~ SOLN
35.0000 [IU] | Freq: Every day | SUBCUTANEOUS | Status: DC
  Administered 2017-10-24: 35 [IU] via SUBCUTANEOUS
  Filled 2017-10-24 (×2): qty 0.35

## 2017-10-24 MED ORDER — ATORVASTATIN CALCIUM 40 MG PO TABS
80.0000 mg | ORAL_TABLET | Freq: Every day | ORAL | Status: DC
  Administered 2017-10-24: 80 mg via ORAL
  Filled 2017-10-24: qty 2

## 2017-10-24 MED ORDER — PANTOPRAZOLE SODIUM 40 MG PO TBEC
40.0000 mg | DELAYED_RELEASE_TABLET | Freq: Every day | ORAL | Status: DC
  Administered 2017-10-24 – 2017-10-25 (×2): 40 mg via ORAL
  Filled 2017-10-24 (×2): qty 1

## 2017-10-24 MED ORDER — SODIUM CHLORIDE 0.9 % IV SOLN
INTRAVENOUS | Status: DC
  Administered 2017-10-24 – 2017-10-25 (×2): via INTRAVENOUS

## 2017-10-24 MED ORDER — UMECLIDINIUM-VILANTEROL 62.5-25 MCG/INH IN AEPB
1.0000 | INHALATION_SPRAY | Freq: Every day | RESPIRATORY_TRACT | Status: DC
  Administered 2017-10-24 – 2017-10-25 (×2): 1 via RESPIRATORY_TRACT
  Filled 2017-10-24: qty 14

## 2017-10-24 MED ORDER — VITAMIN D 1000 UNITS PO TABS
5000.0000 [IU] | ORAL_TABLET | Freq: Every day | ORAL | Status: DC
  Administered 2017-10-24 – 2017-10-25 (×2): 5000 [IU] via ORAL
  Filled 2017-10-24 (×2): qty 5

## 2017-10-24 MED ORDER — POLYETHYLENE GLYCOL 3350 17 G PO PACK: 17.0000 g | PACK | Freq: Every day | ORAL | Status: DC | PRN

## 2017-10-24 MED ORDER — ALBUTEROL SULFATE (2.5 MG/3ML) 0.083% IN NEBU: 2.5000 mg | INHALATION_SOLUTION | Freq: Four times a day (QID) | RESPIRATORY_TRACT | Status: DC | PRN

## 2017-10-24 MED ORDER — GABAPENTIN 300 MG PO CAPS
300.0000 mg | ORAL_CAPSULE | Freq: Three times a day (TID) | ORAL | Status: DC
  Administered 2017-10-24 – 2017-10-25 (×5): 300 mg via ORAL
  Filled 2017-10-24 (×5): qty 1

## 2017-10-24 MED ORDER — SODIUM CHLORIDE 0.9% FLUSH
3.0000 mL | Freq: Two times a day (BID) | INTRAVENOUS | Status: DC
  Administered 2017-10-24: 3 mL via INTRAVENOUS

## 2017-10-24 MED ORDER — POTASSIUM CHLORIDE IN NACL 40-0.9 MEQ/L-% IV SOLN: INTRAVENOUS | Status: DC

## 2017-10-24 MED ORDER — ONDANSETRON HCL 4 MG/2ML IJ SOLN: 4.0000 mg | Freq: Four times a day (QID) | INTRAMUSCULAR | Status: DC | PRN

## 2017-10-24 MED ORDER — LEVOTHYROXINE SODIUM 50 MCG PO TABS
150.0000 ug | ORAL_TABLET | Freq: Every day | ORAL | Status: DC
  Administered 2017-10-24 – 2017-10-25 (×2): 150 ug via ORAL
  Filled 2017-10-24 (×2): qty 3

## 2017-10-24 MED ORDER — TIZANIDINE HCL 4 MG PO TABS: 8.0000 mg | ORAL_TABLET | Freq: Three times a day (TID) | ORAL | Status: DC | PRN

## 2017-10-24 MED ORDER — ACETAMINOPHEN 325 MG PO TABS: 650.0000 mg | ORAL_TABLET | Freq: Four times a day (QID) | ORAL | Status: DC | PRN

## 2017-10-24 MED ORDER — SODIUM CHLORIDE 0.9 % IV SOLN
INTRAVENOUS | Status: AC
  Administered 2017-10-24: 04:00:00 via INTRAVENOUS

## 2017-10-24 MED ORDER — ALPRAZOLAM 0.5 MG PO TABS
1.0000 mg | ORAL_TABLET | Freq: Three times a day (TID) | ORAL | Status: DC | PRN
  Administered 2017-10-24 – 2017-10-25 (×5): 1 mg via ORAL
  Filled 2017-10-24 (×5): qty 2

## 2017-10-24 MED ORDER — ACETAMINOPHEN 650 MG RE SUPP: 650.0000 mg | Freq: Four times a day (QID) | RECTAL | Status: DC | PRN

## 2017-10-24 MED ORDER — DOCUSATE SODIUM 100 MG PO CAPS
300.0000 mg | ORAL_CAPSULE | Freq: Every day | ORAL | Status: DC
  Administered 2017-10-24 – 2017-10-25 (×2): 300 mg via ORAL
  Filled 2017-10-24 (×2): qty 3

## 2017-10-24 MED ORDER — TOPIRAMATE 25 MG PO TABS
75.0000 mg | ORAL_TABLET | Freq: Every day | ORAL | Status: DC
  Administered 2017-10-24 – 2017-10-25 (×2): 75 mg via ORAL
  Filled 2017-10-24 (×2): qty 3

## 2017-10-24 NOTE — Medical Student Note (Signed)
[facility] DEPT Provider Student Note For educational purposes for Medical, PA and NP students only and not part of the legal medical record.   CSN: [id number] Arrival date & time: 10/23/17  2046     History   Chief Complaint Chief Complaint  Patient presents with  . Fall   Patient states " I fall out of the chair while texting and do not recall how I got onto the floor."  HPI Stephanie Combs is a 67 y.o. female.  67 year old female patient complaining of "passing out" while seated and finding herself" in the floor unexplainably" after. She states that recently her medications have been altered by her physician. Recently decreased Metoprolol and Imdur dosages by her recollection. He states her Synthroid was also changed by her primary physician multiple times within the last few weeks. She has no nausea vomiting or recurrent chest pain at this time.      Past Medical History:  Diagnosis Date  . Anxiety   . CAD (coronary artery disease)    a. s/p DES to LAD and angioplasty to D1 in 05/2017  . CHF (congestive heart failure) (HCC)    Diastolic  . Chronic back pain   . COPD (chronic obstructive pulmonary disease) ([place])   . Degenerative disk disease   . Diabetes mellitus without complication ([place])   . Hypertension   . Hypothyroidism   . On home O2    2.5 L N/C prn  . Pedal edema     Patient Active Problem List   Diagnosis Date Noted  . Bradycardia   . Syncope 10/13/2017  . Syncope due to orthostatic hypotension 09/04/2017  . Sinus arrhythmia 09/04/2017  . Obesity, Class III, BMI 40-49.9 (morbid obesity) ([place]) 09/04/2017  . Syncope and collapse 09/02/2017  . Abnormal nuclear stress test 05/19/2017  . Atypical angina ([place]) 05/19/2017  . Edema 05/15/2017  . Skin ulcer ([place]) 01/30/2017  . Tinea cruris 10/10/2016  . Vitamin D deficiency 08/19/2016  . Personal history of noncompliance with medical treatment, presenting hazards to health 06/17/2016  . Left hand  weakness 12/06/2015  . CAP (community acquired pneumonia) 07/25/2015  . Pressure ulcer 05/02/2015  . Hyponatremia 05/01/2015  . Acute-on-chronic kidney injury ([place]) 05/01/2015  . Uncontrolled type 2 diabetes mellitus with stage 3 chronic kidney disease ([place]) 05/01/2015  . COPD exacerbation ([place]) 04/30/2015  . Cellulitis of foot, right 03/24/2015  . Elevated d-dimer 03/24/2015  . Peripheral edema 12/04/2014  . AKI (acute kidney injury) ([place]) 12/04/2014  . Bilateral edema of lower extremity   . Diabetic ulcer of right great toe ([place])   . Foot ulcer due to secondary DM ([place]) 11/07/2014  . Diabetic ulcer of right foot ([place]) 11/07/2014  . Edema of lower extremity 05/27/2013  . Headache 05/26/2013  . CKD (chronic kidney disease), stage III ([place]) 04/14/2013  . Leukocytosis 04/14/2013  . Anemia in CKD (chronic kidney disease) 04/14/2013  . Chest pain 04/14/2013  . Dyspnea 04/14/2013  . Chronic diastolic CHF (congestive heart failure) ([place])   . Hypothyroidism   . COPD (chronic obstructive pulmonary disease) ([place]) 08/07/2012  . Morbid obesity ([place]) 08/07/2012  . DM type 2 (diabetes mellitus, type 2) ([place]) 08/07/2012  . HTN (hypertension), benign 08/07/2012  . Hypothyroidism (acquired) 08/07/2012    Past Surgical History:  Procedure Laterality Date  . CORONARY STENT INTERVENTION N/A 05/22/2017   Procedure: CORONARY STENT INTERVENTION;  Surgeon: Martinique, Peter M, MD;  Location: [facility];  Service: Cardiovascular;  Laterality: N/A;  . HERNIA REPAIR    . RIGHT/LEFT HEART CATH AND CORONARY ANGIOGRAPHY N/A 05/19/2017   Procedure: RIGHT/LEFT HEART CATH AND CORONARY ANGIOGRAPHY;  Surgeon: Leonie Man, MD;  Location: [facility];  Service: Cardiovascular;  Laterality: N/A;  . TUBAL LIGATION      OB History   None      Home Medications    Prior to Admission medications   Medication Sig Start Date End Date Taking? Authorizing Provider  albuterol (PROAIR HFA) 108 (90  BASE) MCG/ACT inhaler Inhale 1 puff into the lungs every 4 (four) hours as needed. For shortness of breath Patient taking differently: Inhale 2 puffs into the lungs every 4 (four) hours as needed. For shortness of breath 05/30/13  Yes Buriev, Arie Sabina, MD  albuterol (PROVENTIL) (2.5 MG/3ML) 0.083% nebulizer solution Take 2.5 mg by nebulization 2 (two) times daily as needed for wheezing or shortness of breath.    Yes [provider]  ALPRAZolam Duanne Moron) 1 MG tablet Take 1 mg by mouth 3 (three) times daily as needed for anxiety or sleep.  01/05/17  Yes [provider]  aspirin EC 81 MG tablet Take 81 mg by mouth daily.   Yes [provider]  atorvastatin (LIPITOR) 80 MG tablet Take 1 tablet (80 mg total) by mouth daily at 6 PM. Patient taking differently: Take 80 mg by mouth every morning.  05/26/17  Yes [name], MD  Cholecalciferol (VITAMIN D3) 5000 units CAPS Take 1 capsule (5,000 Units total) by mouth daily. 08/19/16  Yes Cassandria Anger, MD  clopidogrel (PLAVIX) 75 MG tablet Take 1 tablet (75 mg total) by mouth daily. 05/26/17  Yes [name], MD  docusate sodium (COLACE) 100 MG capsule Take 300 mg by mouth daily.   Yes [provider]  ferrous sulfate 325 (65 FE) MG tablet Take 1 tablet (325 mg total) by mouth daily with breakfast. 02/05/17  Yes [name], MD  furosemide (LASIX) 40 MG tablet Take 1 tablet (40 mg total) by mouth daily. 10/16/17  Yes Johnson, Clanford L, MD  gabapentin (NEURONTIN) 300 MG capsule Take 1 capsule (300 mg total) by mouth 3 (three) times daily. 02/05/17  Yes [name], MD  Hydrocortisone (GERHARDT'S BUTT CREAM) CREA Apply 1 application topically 3 (three) times daily. 05/26/17  Yes [name], MD  insulin aspart (NOVOLOG) 100 UNIT/ML injection Inject 0-9 Units into the skin 3 (three) times daily with meals. Patient taking differently: Inject 1-18 Units into the skin 3 (three) times daily with meals. Per sliding scale 05/26/17  Yes [name], MD  insulin detemir (LEVEMIR) 100 UNIT/ML injection Inject 0.52 mLs (52 Units total) into the skin at bedtime. 05/26/17  Yes [name], MD  isosorbide mononitrate (IMDUR) 30 MG 24 hr tablet Take 1 tablet (30 mg total) by mouth daily. 10/17/17 11/16/17 Yes Johnson, Clanford L, MD  levothyroxine (SYNTHROID, LEVOTHROID) 100 MCG tablet Take 1 tablet (100 mcg total) by mouth daily at 6 (six) AM. Patient taking differently: Take 150 mcg by mouth daily at 6 (six) AM.  10/16/17 11/15/17 Yes Johnson, Clanford L, MD  nystatin (MYCOSTATIN/NYSTOP) powder Apply topically 2 (two) times daily. 10/10/16  Yes Eber Jones, MD  Omega-3 Fatty Acids (FISH OIL PO) Take 1 capsule by mouth daily.    Yes [provider]  ondansetron (ZOFRAN) 4 MG tablet Take 4 mg by mouth every 8 (eight) hours as needed for vomiting.  01/01/17  Yes [provider]  oxyCODONE-acetaminophen (PERCOCET) 10-325 MG tablet Take 1 tablet by mouth every 6 (six) hours as needed for pain.   Yes [provider]  OXYGEN Inhale 2 L into the lungs continuous.   Yes [provider]  pantoprazole (PROTONIX) 40 MG tablet Take 1 tablet (40 mg total) by mouth daily. 02/05/17  Yes [name], MD  polyethylene glycol [facility] / Floria Raveling) packet Take 17 g by mouth daily as needed for mild constipation or moderate constipation. 10/16/17  Yes Johnson, Clanford L, MD  tiZANidine (ZANAFLEX) 4 MG tablet Take 8 mg by mouth 2 (two) times daily.  01/10/17  Yes [provider]  topiramate (TOPAMAX) 25 MG tablet Take 75 mg by mouth daily.  01/02/17  Yes [provider]  umeclidinium-vilanterol (ANORO ELLIPTA) 62.5-25 MCG/INH AEPB Inhale 1 puff into the lungs daily. 05/26/17  Yes [name], MD  VICTOZA 18 MG/3ML SOPN Inject 1.2 mg into the skin daily.  01/01/17  Yes [provider]  SURE COMFORT INS SYR 1CC/28G 28G X 1/2" 1 ML MISC USE TO INJECT INSULIN UP TO 4 TIMES DAILY. 03/06/17   Cassandria Anger, MD     Family History Family History  Problem Relation Age of Onset  . Stroke Mother   . Heart attack Father   . Diabetes Brother   . Cerebral palsy Brother   . Pneumonia Brother   . Diabetes Other   . Heart attack Other     Social History Social History   Tobacco Use  . Smoking status: Former Smoker    Packs/day: 0.25    Types: Cigarettes    Last attempt to quit: 06/27/2015    Years since quitting: 2.3  . Smokeless tobacco: Never Used  Substance Use Topics  . Alcohol use: No    Alcohol/week: 0.0 oz  . Drug use: No     Allergies   Dilaudid [hydromorphone hcl]; Actifed cold-allergy [chlorpheniramine-phenylephrine]; Doxycycline; Other; Penicillins; Reglan [metoclopramide]; Valium [diazepam]; and Vistaril [hydroxyzine hcl]   Review of Systems Review of Systems  Constitutional: Positive for appetite change and fatigue. Negative for fever and unexpected weight change.  HENT: Negative.   Eyes: Negative for visual disturbance.  Respiratory: Positive for chest tightness and shortness of breath.        Occasional shortness of breathe for which she has home O2 but "does not need it regularly".   Cardiovascular: Positive for chest pain.       No chest pain currently.  Gastrointestinal: Positive for nausea. Negative for vomiting.  Endocrine: Negative.   Genitourinary: Negative.   Musculoskeletal: Negative.   Skin: Negative.   Allergic/Immunologic: Negative.   Neurological: Positive for syncope and light-headedness. Negative for seizures, speech difficulty, numbness and headaches.  Hematological: Negative.      Physical Exam Updated Vital Signs BP (!) 164/79   Pulse (!) 101   Temp (!) 97.4 F (36.3 C) (Oral)   Resp 13   Ht 5\' 6"  (1.676 m)   Wt 129.3 kg   SpO2 98%   BMI 46.00 kg/m   Physical Exam  Constitutional: She is oriented to person, place, and time. She appears well-developed and well-nourished.  HENT:  Head: Atraumatic.  Eyes: Pupils are equal, round, and  reactive to light.  Neck: Normal range of motion. Neck supple.  Cardiovascular: Normal rate, regular rhythm, normal heart sounds and intact distal pulses.  Pulmonary/Chest: Effort normal and breath sounds normal.  Abdominal: Soft. Bowel sounds are normal. There is no tenderness. There is no rebound  and no guarding.  Musculoskeletal: Normal range of motion.  Neurological: She is alert and oriented to person, place, and time. No sensory deficit.  Skin: Skin is warm and dry. Capillary refill takes less than 2 seconds.  Psychiatric: Her behavior is normal.  Some anxiety about her changes in medication and current condition/reason for visit.     ED Treatments / Results  Labs (all labs ordered are listed, but only abnormal results are displayed) Labs Reviewed  CBC WITH DIFFERENTIAL/PLATELET - Abnormal; Notable for the following components:      Result Value   RBC 3.33 (*)    Hemoglobin 9.6 (*)    HCT 30.9 (*)    RDW 15.9 (*)    All other components within normal limits  URINALYSIS, ROUTINE W REFLEX MICROSCOPIC - Abnormal; Notable for the following components:   Color, Urine AMBER (*)    APPearance HAZY (*)    Bilirubin Urine SMALL (*)    Ketones, ur 5 (*)    All other components within normal limits  TSH - Abnormal; Notable for the following components:   TSH 173.254 (*)    All other components within normal limits  RAPID URINE DRUG SCREEN, HOSP PERFORMED - Abnormal; Notable for the following components:   Benzodiazepines POSITIVE (*)    Barbiturates   (*)    Value: Result not available. Reagent lot number recalled by manufacturer.   All other components within normal limits  ETHANOL  COMPREHENSIVE METABOLIC PANEL  TROPONIN I    EKG None Radiology No results found.  Procedures Procedures (including critical care time)  Medications Ordered in ED Medications  sodium chloride 0.9 % bolus 1,000 mL (1,000 mLs Intravenous New Bag/Given 10/23/17 2244)     Initial Impression  / Assessment and Plan / ED Course  I have reviewed the triage vital signs and the nursing notes.  Pertinent labs & imaging results that were available during my care of the patient were reviewed by me and considered in my medical decision making (see chart for details).  Clinical Course as of Oct 24 136  Fri Oct 23, 2017  2147 Patient is clinically dehydrated, and likely needs fluids because of hypotension.  She does not have heart failure.  Ejection fraction 65% during catheterization January 2019.  Also at that time she did receive cardiac stenting.  She has other coronary lesions which could not be treated.  IV fluid bolus ordered to improve status.   [EW]  2150 The patient is requesting to be admitted because she is worried that she will fall again at home.   [EW]  2230 Blood pressure improved after IV fluids  BP(!): 103/55 [EW]  2323 Normal except hemoglobin low 9.6  CBC with Differential(!) [EW]    Clinical Course User Index [EW] Daleen Bo, MD      Final Clinical Impressions(s) / ED Diagnoses   Final diagnoses:  Syncope, unspecified syncope type  Malaise    New Prescriptions New Prescriptions   No medications on file

## 2017-10-24 NOTE — H&P (Signed)
History and Physical    Stephanie Combs NID:[id number] DOB: 01/10/1951 DOA: 10/23/2017  PCP: [name], MD   Patient coming from: Home  Chief Complaint: Syncope   HPI: Stephanie Combs is a 67 y.o. female with medical history significant for coronary artery disease, chronic back pain, COPD, anxiety, insulin-dependent diabetes mellitus, and hypothyroidism, now presenting to the emergency department with a recurrent syncopal episode.  Patient was admitted to the hospital earlier this month after syncopal episode.  She was found to be severely hypothyroid and orthostatic, instructed to discontinue her beta-blocker and increase her Synthroid, and she was discharged home in much improved and stable condition.  She reports feeling fatigued and having a poor appetite, but had otherwise been going about her usual activities, and was sitting in a chair tonight when she apparently passed out, waking on the floor.  She called EMS and was transported to the ED.  Patient denies any chest pain, palpitations, headache, change in vision or hearing, or focal numbness or weakness.  ED Course: Upon arrival to the ED, patient is found to be afebrile, saturating low 90s on room air, blood pressure 83/48, and normal heart rate.  There was a 25 bpm increase in HR upon standing.  EKG features a sinus bradycardia with rate 49.  Chemistry panel is notable for mild hyponatremia and creatinine 1.76, up from 1.36 earlier this month.  CBC is notable for stable chronic normocytic anemia with hemoglobin of 9.6.  Troponin is undetectable and urinalysis unremarkable.  Patient was given a liter of normal saline in the ED with improvement in her blood pressure.  She will be observed on the telemetry unit for ongoing evaluation and management of recurrent syncope.  Review of Systems:  All other systems reviewed and apart from HPI, are negative.  Past Medical History:  Diagnosis Date  . Anxiety   . CAD (coronary artery disease)    a. s/p DES to LAD and angioplasty to D1 in 05/2017  . CHF (congestive heart failure) (HCC)    Diastolic  . Chronic back pain   . COPD (chronic obstructive pulmonary disease) ([place])   . Degenerative disk disease   . Diabetes mellitus without complication ([place])   . Hypertension   . Hypothyroidism   . On home O2    2.5 L N/C prn  . Pedal edema     Past Surgical History:  Procedure Laterality Date  . CORONARY STENT INTERVENTION N/A 05/22/2017   Procedure: CORONARY STENT INTERVENTION;  Surgeon: Martinique, Peter M, MD;  Location: [facility];  Service: Cardiovascular;  Laterality: N/A;  . HERNIA REPAIR    . RIGHT/LEFT HEART CATH AND CORONARY ANGIOGRAPHY N/A 05/19/2017   Procedure: RIGHT/LEFT HEART CATH AND CORONARY ANGIOGRAPHY;  Surgeon: Leonie Man, MD;  Location: [facility];  Service: Cardiovascular;  Laterality: N/A;  . TUBAL LIGATION       reports that she quit smoking about 2 years ago. Her smoking use included cigarettes. She smoked 0.25 packs per day. She has never used smokeless tobacco. She reports that she does not drink alcohol or use drugs.  Allergies  Allergen Reactions  . Dilaudid [Hydromorphone Hcl] Itching  . Actifed Cold-Allergy [Chlorpheniramine-Phenylephrine]     "I was sick and red and it didn't agree with me at all"  . Doxycycline Nausea And Vomiting  . Other Cough    Pt states she is allergic to ragweed and that she starts coughing and sneezing like crazy  .  Penicillins Hives and Itching    Tolerates Rocephin Has patient had a PCN reaction causing immediate rash, facial/tongue/throat swelling, SOB or lightheadedness with hypotension: Yes Has patient had a PCN reaction causing severe rash involving mucus membranes or skin necrosis: No Has patient had a PCN reaction that required hospitalization Yes Has patient had a PCN reaction occurring within the last 10 years: No If all of the above answers are "NO", then may proceed with Cephalosporin use.     . Reglan [Metoclopramide] Itching  . Valium [Diazepam] Itching  . Vistaril [Hydroxyzine Hcl] Itching    Family History  Problem Relation Age of Onset  . Stroke Mother   . Heart attack Father   . Diabetes Brother   . Cerebral palsy Brother   . Pneumonia Brother   . Diabetes Other   . Heart attack Other      Prior to Admission medications   Medication Sig Start Date End Date Taking? Authorizing Provider  albuterol (PROAIR HFA) 108 (90 BASE) MCG/ACT inhaler Inhale 1 puff into the lungs every 4 (four) hours as needed. For shortness of breath Patient taking differently: Inhale 2 puffs into the lungs every 4 (four) hours as needed. For shortness of breath 05/30/13  Yes Buriev, Arie Sabina, MD  albuterol (PROVENTIL) (2.5 MG/3ML) 0.083% nebulizer solution Take 2.5 mg by nebulization 2 (two) times daily as needed for wheezing or shortness of breath.    Yes [provider]  ALPRAZolam Duanne Moron) 1 MG tablet Take 1 mg by mouth 3 (three) times daily as needed for anxiety or sleep.  01/05/17  Yes [provider]  aspirin EC 81 MG tablet Take 81 mg by mouth daily.   Yes [provider]  atorvastatin (LIPITOR) 80 MG tablet Take 1 tablet (80 mg total) by mouth daily at 6 PM. Patient taking differently: Take 80 mg by mouth every morning.  05/26/17  Yes [name], MD  Cholecalciferol (VITAMIN D3) 5000 units CAPS Take 1 capsule (5,000 Units total) by mouth daily. 08/19/16  Yes Cassandria Anger, MD  clopidogrel (PLAVIX) 75 MG tablet Take 1 tablet (75 mg total) by mouth daily. 05/26/17  Yes [name], MD  docusate sodium (COLACE) 100 MG capsule Take 300 mg by mouth daily.   Yes [provider]  ferrous sulfate 325 (65 FE) MG tablet Take 1 tablet (325 mg total) by mouth daily with breakfast. 02/05/17  Yes [name], MD  furosemide (LASIX) 40 MG tablet Take 1 tablet (40 mg total) by mouth daily. 10/16/17  Yes Johnson, Clanford L, MD  gabapentin (NEURONTIN) 300 MG capsule Take  1 capsule (300 mg total) by mouth 3 (three) times daily. 02/05/17  Yes [name], MD  Hydrocortisone (GERHARDT'S BUTT CREAM) CREA Apply 1 application topically 3 (three) times daily. 05/26/17  Yes [name], MD  insulin aspart (NOVOLOG) 100 UNIT/ML injection Inject 0-9 Units into the skin 3 (three) times daily with meals. Patient taking differently: Inject 1-18 Units into the skin 3 (three) times daily with meals. Per sliding scale 05/26/17  Yes [name], MD  insulin detemir (LEVEMIR) 100 UNIT/ML injection Inject 0.52 mLs (52 Units total) into the skin at bedtime. 05/26/17  Yes [name], MD  isosorbide mononitrate (IMDUR) 30 MG 24 hr tablet Take 1 tablet (30 mg total) by mouth daily. 10/17/17 11/16/17 Yes Johnson, Clanford L, MD  levothyroxine (SYNTHROID, LEVOTHROID) 100 MCG tablet Take 1 tablet (100 mcg total) by mouth daily at 6 (six) AM. Patient taking differently:  Take 150 mcg by mouth daily at 6 (six) AM.  10/16/17 11/15/17 Yes Johnson, Clanford L, MD  nystatin (MYCOSTATIN/NYSTOP) powder Apply topically 2 (two) times daily. 10/10/16  Yes Eber Jones, MD  Omega-3 Fatty Acids (FISH OIL PO) Take 1 capsule by mouth daily.    Yes [provider]  ondansetron (ZOFRAN) 4 MG tablet Take 4 mg by mouth every 8 (eight) hours as needed for vomiting.  01/01/17  Yes [provider]  oxyCODONE-acetaminophen (PERCOCET) 10-325 MG tablet Take 1 tablet by mouth every 6 (six) hours as needed for pain.   Yes [provider]  OXYGEN Inhale 2 L into the lungs continuous.   Yes [provider]  pantoprazole (PROTONIX) 40 MG tablet Take 1 tablet (40 mg total) by mouth daily. 02/05/17  Yes [name], MD  polyethylene glycol [facility] / Floria Raveling) packet Take 17 g by mouth daily as needed for mild constipation or moderate constipation. 10/16/17  Yes Johnson, Clanford L, MD  tiZANidine (ZANAFLEX) 4 MG tablet Take 8 mg by mouth 2 (two) times daily.  01/10/17  Yes [provider]    topiramate (TOPAMAX) 25 MG tablet Take 75 mg by mouth daily.  01/02/17  Yes [provider]  umeclidinium-vilanterol (ANORO ELLIPTA) 62.5-25 MCG/INH AEPB Inhale 1 puff into the lungs daily. 05/26/17  Yes [name], MD  VICTOZA 18 MG/3ML SOPN Inject 1.2 mg into the skin daily.  01/01/17  Yes [provider]  SURE COMFORT INS SYR 1CC/28G 28G X 1/2" 1 ML MISC USE TO INJECT INSULIN UP TO 4 TIMES DAILY. 03/06/17   Cassandria Anger, MD    Physical Exam: Vitals:   10/24/17 0100 10/24/17 0101 10/24/17 0130 10/24/17 0200  BP: (!) 164/79 (!) 164/79 (!) 154/46 (!) 109/49  Pulse:  (!) 101    Resp: 14 13 11 13   Temp:      TempSrc:      SpO2: 97% 98% 99%   Weight:      Height:          Constitutional: NAD, calm, obese Eyes: PERTLA, lids and conjunctivae normal ENMT: Mucous membranes are moist. Posterior pharynx clear of any exudate or lesions.   Neck: normal, supple, no masses, no thyromegaly Respiratory: clear to auscultation bilaterally, no wheezing, no crackles. Normal respiratory effort.    Cardiovascular: S1 & S2 heard, regular rate and rhythm. 1+ pretibial edema bilaterally. No significant JVD. Abdomen: No distension, no tenderness, soft. Bowel sounds normal.  Musculoskeletal: no clubbing / cyanosis. No joint deformity upper and lower extremities.    Skin: no significant rashes, lesions, ulcers. Warm, dry, well-perfused. Neurologic: CN 2-12 grossly intact. Sensation intact. Strength 5/5 in all 4 limbs.  Psychiatric:  Alert and oriented to person, place, and situation. Cooperative.     Labs on Admission: I have personally reviewed following labs and imaging studies  CBC: Recent Labs  Lab 10/23/17 2125  WBC 7.6  NEUTROABS 3.7  HGB 9.6*  HCT 30.9*  MCV 92.8  PLT 527   Basic Metabolic Panel: Recent Labs  Lab 10/23/17 2125  NA 133*  K 3.8  CL 104  CO2 PENDING  GLUCOSE 113*  BUN 25*  CREATININE 1.76*  CALCIUM 8.5*   GFR: Estimated Creatinine  Clearance: 42.7 mL/min (A) (by C-G formula based on SCr of 1.76 mg/dL (H)). Liver Function Tests: Recent Labs  Lab 10/23/17 2125  AST 27  ALT 20  ALKPHOS 47  BILITOT 0.5  PROT 6.7  ALBUMIN 3.9  No results for input(s): LIPASE, AMYLASE in the last 168 hours. No results for input(s): AMMONIA in the last 168 hours. Coagulation Profile: No results for input(s): INR, PROTIME in the last 168 hours. Cardiac Enzymes: Recent Labs  Lab 10/23/17 2125  TROPONINI <0.03   BNP (last 3 results) No results for input(s): PROBNP in the last 8760 hours. HbA1C: No results for input(s): HGBA1C in the last 72 hours. CBG: No results for input(s): GLUCAP in the last 168 hours. Lipid Profile: No results for input(s): CHOL, HDL, LDLCALC, TRIG, CHOLHDL, LDLDIRECT in the last 72 hours. Thyroid Function Tests: Recent Labs    10/23/17 2125  TSH 173.254*   Anemia Panel: No results for input(s): VITAMINB12, FOLATE, FERRITIN, TIBC, IRON, RETICCTPCT in the last 72 hours. Urine analysis:    Component Value Date/Time   COLORURINE AMBER (A) 10/23/2017 2301   APPEARANCEUR HAZY (A) 10/23/2017 2301   LABSPEC 1.020 10/23/2017 2301   PHURINE 5.0 10/23/2017 2301   GLUCOSEU NEGATIVE 10/23/2017 2301   HGBUR NEGATIVE 10/23/2017 2301   BILIRUBINUR SMALL (A) 10/23/2017 2301   KETONESUR 5 (A) 10/23/2017 2301   PROTEINUR NEGATIVE 10/23/2017 2301   UROBILINOGEN 0.2 12/04/2014 0650   NITRITE NEGATIVE 10/23/2017 2301   LEUKOCYTESUR NEGATIVE 10/23/2017 2301   Sepsis Labs: @LABRCNTIP (procalcitonin:4,lacticidven:4) )No results found for this or any previous visit (from the past 240 hour(s)).   Radiological Exams on Admission: No results found.  EKG: Independently reviewed. Sinus bradycardia (rate 49).   Assessment/Plan  1. Recurrent syncope  - Presents following a syncopal episode at home without prodrome  - Found to be dehydrated and orthostatic  - EKG with sinus bradycardia; she has continued to take  metoprolol despite instructions to stop  - Treated with 1 liter NS in ED  - Continue cardiac monitoring, continue gentle IVF hydration, check echocardiogram    2. Acute kidney disease superimposed on CKD stage III  - SCr is 1.76 on admission, up from apparent baseline of ~1.3 - Likely prerenal azotemia in setting of hypovolemia  - Treated in ED with 1 liter of NS and continued on IVF hydration  - Renally-dose medications, avoid nephrotoxins, repeat chem panel in am    3. Hypothyroidism  - TSH markedly elevated, improving since recent increase in Synthroid dosing  - Continue Synthroid    4. Insulin-dependent DM  - A1c was 8.1% earlier this month  - Managed at home with Levemir 52 units qHS and Novolog 1-18 units TID  - Check CBGs and continue with basal insulin and sliding-scale correctional    5. CAD - No anginal complaints  - Continue ASA, Plavix, statin, and Imdur    6. COPD - No wheezing or SOB  - Continue Anoro Ellipta, prn albuterol    7. Hyponatremia  - Serum sodium is 133 in setting of hypothyroidism and hypovolemia  - Treated with 1 liter NS in ED, continued on IVF hydration, repeat chem panel in am     DVT prophylaxis: sq heparin  Code Status: Full  Family Communication: Discussed with patient Consults called: None Admission status: Observation    Vianne Bulls, MD [facility] Pager [phone]  If 7PM-7AM, please contact night-coverage [email or web address] Password [email or web address]  10/24/2017, 2:40 AM

## 2017-10-24 NOTE — Plan of Care (Signed)
  Problem: Acute Rehab PT Goals(only PT should resolve) Goal: Pt Will Transfer Bed To Chair/Chair To Bed Flowsheets (Taken 10/24/2017 1055) Pt will Transfer Bed to Chair/Chair to Bed: with modified independence Goal: Pt Will Ambulate Flowsheets (Taken 10/24/2017 1055) Pt will Ambulate: 25 feet;with supervision;with rolling walker     Geraldine Solar PT, DPT

## 2017-10-24 NOTE — Progress Notes (Signed)
  Echocardiogram 2D Echocardiogram has been performed.  Ataya Murdy L Androw 10/24/2017, 1:25 PM

## 2017-10-24 NOTE — ED Notes (Signed)
ED Provider at bedside. 

## 2017-10-24 NOTE — ED Provider Notes (Signed)
Care assumed from Dr. Eulis Foster at shift change.  Patient presents after syncopal episode without prodrome.  States she was sitting in the chair when she passed out and awoke on the floor and does not know how she got there.  Similar admission last week for the same.  Develop chest pain after this episode tonight.  History of CAD status post stent in January.  History of diabetes, COPD, severe hypothyroidism and home oxygen use.  Recent admission records reviewed.  Patient did have her thyroid medication increased and her AV nodal blockers stopped due to bradycardia and syncope.  Orthostatics today are positive by heart rate.  Patient states she is still taking 25 mg of Lopressor though according to cardiology notes this was to be discontinued.  She states she is taking 150 mcg of Synthroid that her PCP increased.  She does have dizziness with standing and still appears to be orthostatic. Hypothyroidism likely contributing to presentation as well.   With elevated creatinine, recurrent syncope, and dehydration will plan observation admission.  Discussed with Dr. Myna Hidalgo.   Ezequiel Essex, MD 10/24/17 [phone]

## 2017-10-24 NOTE — Progress Notes (Signed)
Patient seen and examined, database reviewed.  No family members at bedside.  Patient admitted earlier today due to a syncopal episode.  She had a recent admission for the same at which time she was found to be orthostatic and with profound hypothyroidism with a TSH of 199.  She was instructed to increase her Synthroid dose to 100 mcg and to discontinue use of her metoprolol.  She however did not follow recommendations and continued taking metoprolol.  She did follow-up with her primary care physician in the past week and her Synthroid dose was increased to 150 mcg.  TSH this admission is 173.  She remains orthostatic.  We will keep overnight to continue IV fluids, no plans to further change Synthroid dosing as it may take 4 to 6 weeks for appropriate negative feedback loop.  We will continue to follow.  Domingo Mend, MD [facility] Pager: [phone]

## 2017-10-24 NOTE — Evaluation (Signed)
Physical Therapy Evaluation Patient Details Name: Stephanie Combs MRN: [id number] DOB: 01-09-1951 Today's Date: 10/24/2017   History of Present Illness  Stephanie Combs is a 67 y.o. female with medical history significant for coronary artery disease, chronic back pain, COPD, anxiety, insulin-dependent diabetes mellitus, and hypothyroidism, now presenting to the emergency department with a recurrent syncopal episode.  Patient was admitted to the hospital earlier this month after syncopal episode.  She was found to be severely hypothyroid and orthostatic, instructed to discontinue her beta-blocker and increase her Synthroid, and she was discharged home in much improved and stable condition.  She reports feeling fatigued and having a poor appetite, but had otherwise been going about her usual activities, and was sitting in a chair tonight when she apparently passed out, waking on the floor.  She called EMS and was transported to the ED.  Patient denies any chest pain, palpitations, headache, change in vision or hearing, or focal numbness or weakness.  Clinical Impression  Pt received in bed and was agreeable to PT evaluation. RN present during PLOF questioning and was taking orthostatic hypotension vitals. Pt from home alone where she was independent with HH ambulation without AD and short community ambulator with RW. She reports her family comes everyday to help her bathe and perform higher level IADLs but she is able to perform ADLs independently. Currently, pt mod I for bed mobility and transfers and required supervision for sidestepping at EOB. Pt reporting that she had recently just been approved for HHPT and nursing following her last hospital discharge; PT was set to come out on Monday, 10/26/17. PT recommends resumption of HHPT services in order to maximize pt's gait, endurance, balance, and overall strength in order to promote return to PLOF. Will follow acutely to address the impairments listed  below.    Follow Up Recommendations Home health PT;Supervision - Intermittent    Equipment Recommendations  None recommended by PT    Recommendations for Other Services       Precautions / Restrictions Precautions Precautions: Fall Precaution Comments: due to IV poles and reports of 2 falls in last 6 months when sitting      Mobility  Bed Mobility Overal bed mobility: Modified Independent                Transfers Overall transfer level: Modified independent               General transfer comment: sit to stands at EOB  Ambulation/Gait Ambulation/Gait assistance: Supervision Gait Distance (Feet): 4 Feet Assistive device: None       General Gait Details: limited to few short side steps at EOB per pt's request to return to bed after taking orthostatic hypotension vitals with RN  Stairs            Wheelchair Mobility    Modified Rankin (Stroke Patients Only)       Balance Overall balance assessment: Mild deficits observed, not formally tested                                           Pertinent Vitals/Pain Pain Assessment: 0-10 Pain Score: 7  Pain Location: LBP Pain Descriptors / Indicators: Aching Pain Intervention(s): Limited activity within patient's tolerance;Monitored during session;Repositioned    Home Living Family/patient expects to be discharged to:: Private residence Living Arrangements: Alone Available Help at Discharge: Family;Available 24 hours/day Type of  Home: Mobile home Home Access: Ramped entrance     Home Layout: One level Home Equipment: Pitman - 2 wheels;Cane - single point;Shower seat;Bedside commode;Wheelchair - manual      Prior Function Level of Independence: Independent with assistive device(s)         Comments: HH ambulation without AD, short community distances with RW; states she does not like to get out of the house a lot      Hand Dominance   Dominant Hand: Right     Extremity/Trunk Assessment   Upper Extremity Assessment Upper Extremity Assessment: Overall WFL for tasks assessed    Lower Extremity Assessment Lower Extremity Assessment: Generalized weakness       Communication   Communication: No difficulties  Cognition Arousal/Alertness: Awake/alert Behavior During Therapy: WFL for tasks assessed/performed Overall Cognitive Status: Within Functional Limits for tasks assessed                                        General Comments      Exercises     Assessment/Plan    PT Assessment Patient needs continued PT services  PT Problem List Decreased activity tolerance;Decreased strength;Decreased mobility;Obesity;Cardiopulmonary status limiting activity;Decreased balance       PT Treatment Interventions DME instruction;Gait training;Functional mobility training;Therapeutic activities;Therapeutic exercise;Balance training;Neuromuscular re-education;Patient/family education    PT Goals (Current goals can be found in the Care Plan section)  Acute Rehab PT Goals Patient Stated Goal: to go home PT Goal Formulation: With patient Time For Goal Achievement: 10/31/17 Potential to Achieve Goals: Good    Frequency Min 2X/week   Barriers to discharge        Co-evaluation               AM-PAC PT "6 Clicks" Daily Activity  Outcome Measure                  End of Session   Activity Tolerance: Patient tolerated treatment well;No increased pain Patient left: in bed;with call bell/phone within reach;with bed alarm set;Other (comment)(RT in room) Nurse Communication: Mobility status;Precautions PT Visit Diagnosis: Muscle weakness (generalized) (M62.81);History of falling (Z91.81);Unsteadiness on feet (R26.81);Other abnormalities of gait and mobility (R26.89)    Time: 7048-8891 PT Time Calculation (min) (ACUTE ONLY): 25 min   Charges:   PT Evaluation $PT Eval Low Complexity: 1 Low PT Treatments $Therapeutic  Activity: 8-22 mins   PT G Codes:           Geraldine Solar PT, DPT

## 2017-10-25 LAB — GLUCOSE, CAPILLARY
Glucose-Capillary: 239 mg/dL — ABNORMAL HIGH (ref 65–99)
Glucose-Capillary: 200 mg/dL — ABNORMAL HIGH (ref 65–99)

## 2017-10-25 MED ORDER — LEVOTHYROXINE SODIUM 150 MCG PO TABS: 150.0000 ug | ORAL_TABLET | Freq: Every day | ORAL | 2 refills | Status: DC

## 2017-10-25 NOTE — Discharge Summary (Signed)
Physician Discharge Summary  Stephanie Combs HUT:[id number] DOB: 11-26-1950 DOA: 10/23/2017  PCP: [name], MD  Admit date: 10/23/2017 Discharge date: 10/25/2017  Time spent: 45  minutes  Recommendations for Outpatient Follow-up:  -Will be discharged home today. -Advised to follow up with PCP in 2 weeks. -Advised to stop metoprolol given bradycardia and hypotension. -Advised to hold lasix and imdur for 3 days due to severe orthostatic hypotension and to resume on 10/29/17.   Discharge Diagnoses:  Principal Problem:   Syncope due to orthostatic hypotension Active Problems:   COPD (chronic obstructive pulmonary disease) (HCC)   DM type 2 (diabetes mellitus, type 2) (HCC)   HTN (hypertension), benign   Hypothyroidism   Anemia in CKD (chronic kidney disease)   Hyponatremia   Acute-on-chronic kidney injury ([place])   Syncope   Discharge Condition: Stable and improved  Filed Weights   10/23/17 2055 10/24/17 0334 10/25/17 0505  Weight: 129.3 kg (285 lb) 131.8 kg (290 lb 9.1 oz) 134.5 kg (296 lb 8.3 oz)    History of present illness:  As per Dr. Myna Hidalgo on 6/15: Stephanie Combs is a 67 y.o. female with medical history significant for coronary artery disease, chronic back pain, COPD, anxiety, insulin-dependent diabetes mellitus, and hypothyroidism, now presenting to the emergency department with a recurrent syncopal episode.  Patient was admitted to the hospital earlier this month after syncopal episode.  She was found to be severely hypothyroid and orthostatic, instructed to discontinue her beta-blocker and increase her Synthroid, and she was discharged home in much improved and stable condition.  She reports feeling fatigued and having a poor appetite, but had otherwise been going about her usual activities, and was sitting in a chair tonight when she apparently passed out, waking on the floor.  She called EMS and was transported to the ED.  Patient denies any chest pain, palpitations,  headache, change in vision or hearing, or focal numbness or weakness.  ED Course: Upon arrival to the ED, patient is found to be afebrile, saturating low 90s on room air, blood pressure 83/48, and normal heart rate.  There was a 25 bpm increase in HR upon standing.  EKG features a sinus bradycardia with rate 49.  Chemistry panel is notable for mild hyponatremia and creatinine 1.76, up from 1.36 earlier this month.  CBC is notable for stable chronic normocytic anemia with hemoglobin of 9.6.  Troponin is undetectable and urinalysis unremarkable.  Patient was given a liter of normal saline in the ED with improvement in her blood pressure.  She will be observed on the telemetry unit for ongoing evaluation and management of recurrent syncope.    Hospital Course:   Syncopal event -Believe due to severe orthostatic hypotension as well as severe hypothyroidism. -TSH 2 weeks ago was 199, down to 173 this admission. -Between her 2 admissions she has seen her PCP who has increased her Synthroid to 150 mcg daily, no plans to further change Synthroid dosing as it may take 4 to 6 weeks for appropriate negative feedback loop. -Her orthostasis has improved with holding of her blood pressure medications including diuretics and IV fluids, despite lower extremity edema she did appear clinically dry on admission. -Have instructed her to discontinue her metoprolol until seen by her PCP given bradycardia in the 50s with her orthostasis, she has also been instructed to hold her Lasix for 3 days and to resume on 6/20.  Rest of chronic conditions have been stable this admission.  Procedures:  None   Consultations:  None  Discharge Instructions  Discharge Instructions    Diet - low sodium heart healthy   Complete by:  As directed    Increase activity slowly   Complete by:  As directed      Allergies as of 10/25/2017      Reactions   Dilaudid [hydromorphone Hcl] Itching   Actifed Cold-allergy  [chlorpheniramine-phenylephrine]    "I was sick and red and it didn't agree with me at all"   Doxycycline Nausea And Vomiting   Other Cough   Pt states she is allergic to ragweed and that she starts coughing and sneezing like crazy   Penicillins Hives, Itching   Tolerates Rocephin Has patient had a PCN reaction causing immediate rash, facial/tongue/throat swelling, SOB or lightheadedness with hypotension: Yes Has patient had a PCN reaction causing severe rash involving mucus membranes or skin necrosis: No Has patient had a PCN reaction that required hospitalization Yes Has patient had a PCN reaction occurring within the last 10 years: No If all of the above answers are "NO", then may proceed with Cephalosporin use.   Reglan [metoclopramide] Itching   Valium [diazepam] Itching   Vistaril [hydroxyzine Hcl] Itching      Medication List    STOP taking these medications   furosemide 40 MG tablet Commonly known as:  LASIX   isosorbide mononitrate 30 MG 24 hr tablet Commonly known as:  IMDUR     TAKE these medications   albuterol (2.5 MG/3ML) 0.083% nebulizer solution Commonly known as:  PROVENTIL Take 2.5 mg by nebulization 2 (two) times daily as needed for wheezing or shortness of breath. What changed:  Another medication with the same name was changed. Make sure you understand how and when to take each.   albuterol 108 (90 Base) MCG/ACT inhaler Commonly known as:  PROAIR HFA Inhale 1 puff into the lungs every 4 (four) hours as needed. For shortness of breath What changed:    how much to take  additional instructions   ALPRAZolam 1 MG tablet Commonly known as:  XANAX Take 1 mg by mouth 3 (three) times daily as needed for anxiety or sleep.   aspirin EC 81 MG tablet Take 81 mg by mouth daily.   atorvastatin 80 MG tablet Commonly known as:  LIPITOR Take 1 tablet (80 mg total) by mouth daily at 6 PM. What changed:  when to take this   clopidogrel 75 MG tablet Commonly  known as:  PLAVIX Take 1 tablet (75 mg total) by mouth daily.   docusate sodium 100 MG capsule Commonly known as:  COLACE Take 300 mg by mouth daily.   ferrous sulfate 325 (65 FE) MG tablet Take 1 tablet (325 mg total) by mouth daily with breakfast.   FISH OIL PO Take 1 capsule by mouth daily.   gabapentin 300 MG capsule Commonly known as:  NEURONTIN Take 1 capsule (300 mg total) by mouth 3 (three) times daily.   Gerhardt's butt cream Crea Apply 1 application topically 3 (three) times daily.   insulin aspart 100 UNIT/ML injection Commonly known as:  novoLOG Inject 0-9 Units into the skin 3 (three) times daily with meals. What changed:    how much to take  additional instructions   insulin detemir 100 UNIT/ML injection Commonly known as:  LEVEMIR Inject 0.52 mLs (52 Units total) into the skin at bedtime.   levothyroxine 150 MCG tablet Commonly known as:  SYNTHROID, LEVOTHROID Take 1 tablet (150 mcg total) by  mouth daily at 6 (six) AM. Start taking on:  10/26/2017 What changed:    medication strength  how much to take   nystatin powder Commonly known as:  MYCOSTATIN/NYSTOP Apply topically 2 (two) times daily.   ondansetron 4 MG tablet Commonly known as:  ZOFRAN Take 4 mg by mouth every 8 (eight) hours as needed for vomiting.   oxyCODONE-acetaminophen 10-325 MG tablet Commonly known as:  PERCOCET Take 1 tablet by mouth every 6 (six) hours as needed for pain.   OXYGEN Inhale 2 L into the lungs continuous.   pantoprazole 40 MG tablet Commonly known as:  PROTONIX Take 1 tablet (40 mg total) by mouth daily.   polyethylene glycol packet Commonly known as:  MIRALAX / GLYCOLAX Take 17 g by mouth daily as needed for mild constipation or moderate constipation.   SURE COMFORT INS SYR 1CC/28G 28G X 1/2" 1 ML Misc Generic drug:  INS SYRINGE/NEEDLE 1CC/28G USE TO INJECT INSULIN UP TO 4 TIMES DAILY.   tiZANidine 4 MG tablet Commonly known as:  ZANAFLEX Take 8 mg  by mouth 2 (two) times daily.   topiramate 25 MG tablet Commonly known as:  TOPAMAX Take 75 mg by mouth daily.   umeclidinium-vilanterol 62.5-25 MCG/INH Aepb Commonly known as:  ANORO ELLIPTA Inhale 1 puff into the lungs daily.   VICTOZA 18 MG/3ML Sopn Generic drug:  liraglutide Inject 1.2 mg into the skin daily.   Vitamin D3 5000 units Caps Take 1 capsule (5,000 Units total) by mouth daily.      Allergies  Allergen Reactions  . Dilaudid [Hydromorphone Hcl] Itching  . Actifed Cold-Allergy [Chlorpheniramine-Phenylephrine]     "I was sick and red and it didn't agree with me at all"  . Doxycycline Nausea And Vomiting  . Other Cough    Pt states she is allergic to ragweed and that she starts coughing and sneezing like crazy  . Penicillins Hives and Itching    Tolerates Rocephin Has patient had a PCN reaction causing immediate rash, facial/tongue/throat swelling, SOB or lightheadedness with hypotension: Yes Has patient had a PCN reaction causing severe rash involving mucus membranes or skin necrosis: No Has patient had a PCN reaction that required hospitalization Yes Has patient had a PCN reaction occurring within the last 10 years: No If all of the above answers are "NO", then may proceed with Cephalosporin use.   . Reglan [Metoclopramide] Itching  . Valium [Diazepam] Itching  . Vistaril [Hydroxyzine Hcl] Itching   Follow-up Information    [name], MD Follow up.   Specialty:  Internal Medicine Why:  as scheduled for 1 week Contact information: [address] [place] [address] [phone]        Herminio Commons, MD .   Specialty:  Cardiology Contact information: [place] [address] [phone]            The results of significant diagnostics from this hospitalization (including imaging, microbiology, ancillary and laboratory) are listed below for reference.    Significant Diagnostic Studies: Ct Head Wo  Contrast  Result Date: 10/13/2017 CLINICAL DATA:  Syncope and fall. EXAM: CT HEAD WITHOUT CONTRAST TECHNIQUE: Contiguous axial images were obtained from the base of the skull through the vertex without intravenous contrast. COMPARISON:  CT head dated September 02, 2017. FINDINGS: Brain: No evidence of acute infarction, hemorrhage, hydrocephalus, extra-axial collection or mass lesion/mass effect. Stable atrophy and chronic microvascular ischemic changes. Vascular: No hyperdense vessel or unexpected  calcification. Skull: Negative for fracture or focal lesion. Sinuses/Orbits: No acute finding. Other: None. IMPRESSION: 1.  No acute intracranial abnormality. Electronically Signed   By: Titus Dubin M.D.   On: 10/13/2017 21:29   Dg Chest Port 1 View  Result Date: 10/15/2017 CLINICAL DATA:  Chest pain that is in the center and radiating to the left per patient. Patient states that it feels like pressure when patient is holding her breath and becomes painful. Hx of CAD, CHF, COPD, diabetes, HTN. Former smoker. EXAM: PORTABLE CHEST 1 VIEW COMPARISON:  10/13/2017 FINDINGS: Patient is rotated. Heart size is mildly enlarged. There are no focal consolidations or pleural effusions. Minimal RIGHT LOWER lobe atelectasis. IMPRESSION: 1. Patient rotation. 2. RIGHT LOWER lobe atelectasis. Electronically Signed   By: Nolon Nations M.D.   On: 10/15/2017 12:35   Dg Chest Port 1 View  Result Date: 10/13/2017 CLINICAL DATA:  Syncope and weakness. EXAM: PORTABLE CHEST 1 VIEW COMPARISON:  09/02/2017 FINDINGS: The patient is rotated. Cardiomegaly and pulmonary vascular congestion noted. Chronic RIGHT basilar atelectasis/scarring noted. No definite pleural effusion or pneumothorax. No acute bony abnormalities. IMPRESSION: Cardiomegaly with pulmonary vascular congestion. Electronically Signed   By: Margarette Canada M.D.   On: 10/13/2017 19:50    Microbiology: No results found for this or any previous visit (from the past 240 hour(s)).     Labs: Basic Metabolic Panel: Recent Labs  Lab 10/23/17 2125 10/24/17 0635  NA 133* 138  K 3.8 4.1  CL 104 105  CO2 TEST NOT PERFORMED, REAGENT NOT AVAILABLE TEST NOT PERFORMED, REAGENT NOT AVAILABLE  GLUCOSE 113* 163*  BUN 25* 30*  CREATININE 1.76* 1.56*  CALCIUM 8.5* 7.9*  MG  --  2.1   Liver Function Tests: Recent Labs  Lab 10/23/17 2125  AST 27  ALT 20  ALKPHOS 47  BILITOT 0.5  PROT 6.7  ALBUMIN 3.9   No results for input(s): LIPASE, AMYLASE in the last 168 hours. No results for input(s): AMMONIA in the last 168 hours. CBC: Recent Labs  Lab 10/23/17 2125 10/24/17 0635  WBC 7.6 7.7  NEUTROABS 3.7  --   HGB 9.6* 9.3*  HCT 30.9* 30.0*  MCV 92.8 92.3  PLT 268 254   Cardiac Enzymes: Recent Labs  Lab 10/23/17 2125  TROPONINI <0.03   BNP: BNP (last 3 results) Recent Labs    01/30/17 1816 04/27/17 1500 05/01/17 1000  BNP 115.6* 188.0* 108.0*    ProBNP (last 3 results) No results for input(s): PROBNP in the last 8760 hours.  CBG: Recent Labs  Lab 10/24/17 1115 10/24/17 1628 10/24/17 2133 10/25/17 0448 10/25/17 0732  GLUCAP 200* 204* 209* 239* 200*       Signed:  [facility] Pager: [phone] 10/25/2017, 11:30 AM

## 2017-10-25 NOTE — Progress Notes (Signed)
IV removed, WNL. D/C instructions given to pt. Verbalized understanding. Pt daughter at bedside to transport home.  

## 2017-10-25 NOTE — Discharge Instructions (Signed)
Hold lasix and imdur for 3 days. Resume on 10/29/17. STOP taking metoprolol.

## 2017-10-26 ENCOUNTER — Telehealth: Payer: Self-pay | Admitting: *Deleted

## 2017-10-26 ENCOUNTER — Other Ambulatory Visit: Payer: Self-pay | Admitting: *Deleted

## 2017-10-26 LAB — GLUCOSE, CAPILLARY: Glucose-Capillary: 205 mg/dL — ABNORMAL HIGH (ref 65–99)

## 2017-10-26 NOTE — Telephone Encounter (Signed)
Patient informed. 

## 2017-10-26 NOTE — Telephone Encounter (Signed)
-----   Message from Herminio Commons, MD sent at 10/23/2017  3:46 PM EDT ----- Regular rhythm with occasional extra beats.  No significant abnormal heart rhythms.

## 2017-10-27 ENCOUNTER — Encounter: Payer: Self-pay | Admitting: *Deleted

## 2017-10-27 ENCOUNTER — Other Ambulatory Visit: Payer: Self-pay

## 2017-10-27 ENCOUNTER — Ambulatory Visit: Payer: Self-pay

## 2017-10-27 NOTE — Patient Outreach (Signed)
[facility]) Care Management   10/27/2017  Stephanie Combs March 21, 1951 [id number]  Stephanie Combs is an 67 y.o. female  Subjective: Initial home visit for care management services.  Objective:   Review of Systems  Constitutional: Negative.   HENT: Negative.   Eyes: Positive for blurred vision.  Respiratory: Negative.   Cardiovascular:       Irregular rhythm  Gastrointestinal: Negative.   Genitourinary: Negative.   Musculoskeletal: Positive for back pain, falls, joint pain and myalgias.  Skin: Negative.   Neurological: Positive for dizziness, sensory change, weakness and headaches.  Endo/Heme/Allergies: Bruises/bleeds easily.  Psychiatric/Behavioral: Positive for depression.    Physical Exam  Constitutional: She is oriented to person, place, and time. She appears well-developed and well-nourished.  Cardiovascular:  Irregular rhythm  Respiratory: Effort normal and breath sounds normal.  GI: Soft. Bowel sounds are normal.  Musculoskeletal:  Severely limited R shoulder with pain. Chronic back pain.  Neurological: She is alert and oriented to person, place, and time.  Skin: Skin is warm and dry.  Psychiatric: She has a normal mood and affect.   Resp 18   Wt 239 lb (108.4 kg)   SpO2 96%   BMI 38.58 kg/m BP sitting 104/54, standing 120/60.  Patient was recently discharged from hospital and all medications have been reviewed.  Encounter Medications:   Outpatient Encounter Medications as of 10/26/2017  Medication Sig Note  . albuterol (PROAIR HFA) 108 (90 BASE) MCG/ACT inhaler Inhale 1 puff into the lungs every 4 (four) hours as needed. For shortness of breath (Patient taking differently: Inhale 2 puffs into the lungs every 4 (four) hours as needed. For shortness of breath)   . albuterol (PROVENTIL) (2.5 MG/3ML) 0.083% nebulizer solution Take 2.5 mg by nebulization 2 (two) times daily as needed for wheezing or shortness of breath.    . ALPRAZolam (XANAX) 1  MG tablet Take 1 mg by mouth 3 (three) times daily as needed for anxiety or sleep.    Marland Kitchen aspirin EC 81 MG tablet Take 81 mg by mouth daily.   Marland Kitchen atorvastatin (LIPITOR) 80 MG tablet Take 1 tablet (80 mg total) by mouth daily at 6 PM. (Patient taking differently: Take 80 mg by mouth every morning. )   . Cholecalciferol (VITAMIN D3) 5000 units CAPS Take 1 capsule (5,000 Units total) by mouth daily.   . clopidogrel (PLAVIX) 75 MG tablet Take 1 tablet (75 mg total) by mouth daily.   Marland Kitchen docusate sodium (COLACE) 100 MG capsule Take 300 mg by mouth daily.   . ferrous sulfate 325 (65 FE) MG tablet Take 1 tablet (325 mg total) by mouth daily with breakfast.   . furosemide (LASIX) 20 MG tablet Take 20 mg by mouth 2 (two) times daily. 10/26/2017: Pt states she was not told to hold her furosemide. She insists on continuing it.  . gabapentin (NEURONTIN) 300 MG capsule Take 1 capsule (300 mg total) by mouth 3 (three) times daily.   . Hydrocortisone (GERHARDT'S BUTT CREAM) CREA Apply 1 application topically 3 (three) times daily. 10/13/2017: Applied to toes  . insulin aspart (NOVOLOG) 100 UNIT/ML injection Inject 0-9 Units into the skin 3 (three) times daily with meals. (Patient taking differently: Inject 1-18 Units into the skin 3 (three) times daily with meals. Per sliding scale)   . insulin detemir (LEVEMIR) 100 UNIT/ML injection Inject 0.52 mLs (52 Units total) into the skin at bedtime.   Marland Kitchen levothyroxine (SYNTHROID, LEVOTHROID) 150 MCG tablet Take 1 tablet (150  mcg total) by mouth daily at 6 (six) AM.   . nystatin (MYCOSTATIN/NYSTOP) powder Apply topically 2 (two) times daily.   . Omega-3 Fatty Acids (FISH OIL PO) Take 1 capsule by mouth daily.    . ondansetron (ZOFRAN) 4 MG tablet Take 4 mg by mouth every 8 (eight) hours as needed for vomiting.    Marland Kitchen oxyCODONE-acetaminophen (PERCOCET) 10-325 MG tablet Take 1 tablet by mouth every 6 (six) hours as needed for pain.   . OXYGEN Inhale 2 L into the lungs continuous.   .  pantoprazole (PROTONIX) 40 MG tablet Take 1 tablet (40 mg total) by mouth daily.   . polyethylene glycol (MIRALAX / GLYCOLAX) packet Take 17 g by mouth daily as needed for mild constipation or moderate constipation.   . potassium chloride (MICRO-K) 10 MEQ CR capsule Take 10 mEq by mouth 2 (two) times daily. 10/26/2017: Pt is taking her potassium as she insists to continue her lasix and potassium.  . SURE COMFORT INS SYR 1CC/28G 28G X 1/2" 1 ML MISC USE TO INJECT INSULIN UP TO 4 TIMES DAILY.   Marland Kitchen tiZANidine (ZANAFLEX) 4 MG tablet Take 8 mg by mouth 2 (two) times daily.  10/13/2017: States that this medication is needed for night time so the doses are taken starting in the evening  . topiramate (TOPAMAX) 25 MG tablet Take 75 mg by mouth daily.    Marland Kitchen umeclidinium-vilanterol (ANORO ELLIPTA) 62.5-25 MCG/INH AEPB Inhale 1 puff into the lungs daily.   Marland Kitchen VICTOZA 18 MG/3ML SOPN Inject 1.2 mg into the skin daily.     No facility-administered encounter medications on file as of 10/26/2017.     Functional Status:   In your present state of health, do you have any difficulty performing the following activities: 10/26/2017 10/24/2017  Hearing? N N  Vision? N N  Difficulty concentrating or making decisions? N N  Walking or climbing stairs? Y Y  Dressing or bathing? N N  Doing errands, shopping? Tempie Donning  Preparing Food and eating ? Y -  Using the Toilet? N -  In the past six months, have you accidently leaked urine? N -  Do you have problems with loss of bowel control? N -  Managing your Medications? N -  Managing your Finances? N -  Housekeeping or managing your Housekeeping? Y -  Some recent data might be hidden    Fall/Depression Screening:    Fall Risk  10/26/2017 08/19/2016 06/17/2016  Falls in the past year? Yes No No  Number falls in past yr: 2 or more - -  Injury with Fall? Yes - -  Risk Factor Category  High Fall Risk - -  Risk for fall due to : History of fall(s) - -  Risk for fall due to: Comment Due  to orthostasis - -  Follow up Falls evaluation completed;Falls prevention discussed - -   PHQ 2/9 Scores 10/26/2017 08/19/2016 06/17/2016 06/17/2016  PHQ - 2 Score 1 0 2 0  PHQ- 9 Score - - 11 -   [facility] CM Care Plan Problem One     Most Recent Value  Care Plan Problem One  Medication descrepancies  Role Documenting the Problem One  Care Management [place] for Problem One  Active  [facility] CM Short Term Goal #1   Pt will verify that MD has called and advised her about taking furosemide and potassium which are not on med list within the next 3 days.  [facility] CM Short  Term Goal #1 Start Date  10/27/17  Interventions for Short Term Goal #1  I have showed the pt the discharge orders that say she is to hold her lasix for 3 days and that potassium is no longer on her med list. She adamantly says she is not going to stop them.  [facility] CM Short Term Goal #2   Reorganize medications into meds currently taken and meds not currently taking and store these in a safe place. Pt is unwilling to remove these as often she is restated on meds.  Interventions for Short Term Goal #2  Provided med box.      Assessment:  Medication descrepancies: Pt discharge summary says to hold lasix 3 days. Pt reports no one told her this and she still has pedal edema 3+. She insists on contining this. Also she continues to take potassium which was not on either of her last 2 hospital discharge sheets.  Pt has multi-comorbidities not optimally managed: DM, Hypothyroidisim, HTN, COPD, Morbid Obesity, CKD, CHF, Recent syncopal episodes  Plan: Will call pt weekly, visit every other week for now.           Call Dr. Maudie Mercury regarding med descrepancies and have his office call with advice on lasix and potassium.  Eulah Pont. Myrtie Neither, MSN, [facility] Gerontological Nurse Practitioner [facility] Care Management [phone]

## 2017-10-27 NOTE — Patient Outreach (Signed)
[facility]) Care Management  10/27/2017  Stephanie Combs [birth year] [id number]  48year old maleref[place] to [place] Management by[facility] hospital liason. [place] services requested for medication assistance for insulins. PMHx includes, but not limited to, hypertension, diastolic CHF, angina, syncope, COPD, Type 2 diabetes mellitus, hypothyroidism and stage III chronic kidney disease.  Unsucessful outreach attempt#3to Ms. Can. Left HIPAA compliant voice message requesting return call.  Plan: If not response from patient, will close case on 10/30/17.  Joetta Manners, PharmD Clinical Pharmacist [place] [phone]

## 2017-10-28 ENCOUNTER — Other Ambulatory Visit: Payer: Self-pay

## 2017-10-28 ENCOUNTER — Ambulatory Visit: Payer: Self-pay

## 2017-10-28 ENCOUNTER — Telehealth: Payer: Self-pay | Admitting: Student

## 2017-10-28 NOTE — Telephone Encounter (Signed)
Based upon hospital discharge summary review dated 10/25/2017, syncope is due to orthostatic hypotension and thus medications were stopped.

## 2017-10-28 NOTE — Telephone Encounter (Signed)
Per phone call from Bradd Canary NP w/ [facility]--  called stating pt has had back to back hospital visits for syncopal episodes, she's needing to speak w/ the nurse concerning her medications   Please give Arbie Cookey a call @ [phone]

## 2017-10-28 NOTE — Patient Outreach (Signed)
[facility]) Care Management  10/28/2017  Stephanie Combs 03-Mar-1951 [id number]    Second outreach attempt to patient regarding social work referral.  BSW left [employer].  Will make second attempt within four business days. Unsuccessful outreach letter mailed.    [name], BSW Social Worker [phone]

## 2017-10-28 NOTE — Telephone Encounter (Signed)
Per Bradd Canary, NP with [facility]- Patient has continued to take furosemide 20 mg BID after being told to hold it as well at K 10 meq BID. Patient refuses to hold these medications. FYI

## 2017-10-29 ENCOUNTER — Other Ambulatory Visit: Payer: Self-pay

## 2017-10-29 NOTE — Patient Outreach (Signed)
[facility]) Care Management  10/29/2017  DIANN BANGERTER 1951/04/20 [id number]   BSW received return phone call from patient regarding social work referral to assist with Medicaid application.  Patient reported that she has already completed the application with her previous case worker over the phone.  Per patient, the application will be mailed to her and she just has to sign it and return it to [place].  BSW is closing case at this time due to no other social work needs. BSW provided contact information for Joetta Manners, [facility] Pharmacist, that has attempting to contact patient.  Patient was encouraged to call her.   [name], BSW Social Worker [phone]

## 2017-10-30 ENCOUNTER — Other Ambulatory Visit: Payer: Self-pay

## 2017-10-30 NOTE — Patient Outreach (Signed)
[facility]) Care Management  10/30/2017  EYANA STOLZE 07-08-1950 [id number]   67year old malereferred to [place] Management by[facility] hospital liason. [place] services requested for medication assistance for insulins. PMHx includes, but not limited to, hypertension, diastolic CHF, angina, syncope, COPD, Type 2 diabetes mellitus, hypothyroidism and stage III chronic kidneys disease.  Incoming voice message received from Ms. Peak, followed by successful outreach attempt.  HIPAA identifiers verified.   Ms. Hsia states that she has been having trouble with her telephone.  She requests that I call her Wednesday afternoon.  Plan: Outreach to patient on Wednesday 6/26 at 2 pm.   Joetta Manners, [place] [phone]

## 2017-11-02 ENCOUNTER — Other Ambulatory Visit: Payer: Self-pay | Admitting: *Deleted

## 2017-11-02 ENCOUNTER — Ambulatory Visit: Payer: Self-pay

## 2017-11-02 NOTE — Patient Outreach (Signed)
Called to complete a weekly transition of care call and evaluate pt status. I was able to leave a message and request a return call.  Stephanie Combs. Myrtie Neither, MSN, [facility] Gerontological Nurse Practitioner [facility] Care Management [phone]

## 2017-11-03 ENCOUNTER — Ambulatory Visit: Payer: Medicare Other

## 2017-11-04 ENCOUNTER — Ambulatory Visit: Payer: Self-pay

## 2017-11-04 ENCOUNTER — Other Ambulatory Visit: Payer: Self-pay

## 2017-11-04 NOTE — Patient Outreach (Signed)
[place] [facility]) Care Management  11/04/2017  Stephanie Combs 1950-10-28 [id number]  19year old malereferred to [place] Management by[facility] hospital liason. [place] services requested for medication assistance for insulins. PMHx includes, but not limited to, hypertension, diastolic CHF, angina, syncope, COPD, Type 2 diabetes mellitus, hypothyroidism and stage III chronic kidney disease.  Sucessful outreach attemptto Stephanie Combs.  HIPAA identifiers verified.  Subjective: Stephanie Combs states that she thinks that she has the flu.  She says that she has a fever of 102 degrees, but does think that she needs to call the doctor.  She states she had a nurse visit yesterday and that she has a doctor's appointment in the morning.  She states that she does not feel well enough to discuss her medications.   She asked if I can call her back another time next week.   Plan: Outreach attempt to Stephanie Combs next week.   Stephanie Combs, PharmD Clinical Pharmacist [place] [phone]

## 2017-11-06 NOTE — Progress Notes (Signed)
Cardiology Office Note    Date:  11/09/2017   ID:  Stephanie Combs, DOB 01-23-1951, MRN [id number]  PCP:  [name], MD  Cardiologist: Kate Sable, MD    Chief Complaint  Patient presents with  . Hospitalization Follow-up    History of Present Illness:    Stephanie Combs is a 67 y.o. female with past medical history of CAD (s/p DES to LAD and angioplasty to D1 in 05/2017), chronic diastolic CHF, HTN, HLD, IDDM, COPD, and Stage 3 CKD  who presents to the office today for hospital follow-up.  She had been admitted in 08/2017 for evaluation of a syncopal episode which was thought to be due to orthostasis as SBP dropped by more than 60 mm Hg at the time of admission. An event monitor was placed upon hospital discharge which showed normal sinus rhythm with occasional PAC's and isolated atrial runs with no significant arrhythmias.  Was again admitted to [facility] from 10/13/2017 to 10/16/2017 for recurrent syncope. Reported that she had been sitting in a chair and woke up on the floor and was unable to recall how long she had lost consciousness for.  Electrolytes were within normal limits but creatinine was elevated 2.09 on admission and TSH was significantly elevated at 199.45. Synthroid dosing was appropriately titrated by the admitting team. She had been bradycardic with heart rate in the 40's to 50's upon admission, therefore Lopressor was discontinued. Heart rate had improved into the 60's by the time of discharge and creatinine was at 1.36 on 10/15/2017.   Presented to the ED again on 10/23/2017 for recurrent syncope and while in the ED blood pressure was found to be at 83/48 and her heart rate increased by 25 bpm with standing. She reported having continued to have taken Metoprolol despite this being discontinued at the time of her most recent admission. This was again discontinued and Lasix was held for 3 days with instructions to resume on 10/29/2017. Imdur was also discontinued. TSH had  improved but remained elevated at 173.  In talking with the patient today, she reports a variety of symptoms over the past several months. She reports being told the part of her heart was "dead" at some point over the past several months and we reviewed her catheterization and most recent echocardiogram in detail which showed diastolic dysfunction but an overall preserved EF. She denies any exertional chest discomfort but does report having sternal pain at night after consuming supper and lying down to go to sleep. She reports compliance with Protonix but usually takes this in the morning hours and denies any reflux symptoms during the day.  Denies any repeat syncopal episodes since her most recent hospital discharge. Home health has been checking her heart rate and blood pressure at home and she says this has overall been stable. BP is at 126/70 during today's visit. She has chronic lower extremity edema but denies any acute changes in her respiratory status.   Past Medical History:  Diagnosis Date  . Anxiety   . CAD (coronary artery disease)    a. s/p DES to LAD and angioplasty to D1 in 05/2017  . CHF (congestive heart failure) (HCC)    Diastolic  . Chronic back pain   . COPD (chronic obstructive pulmonary disease) ([place])   . Degenerative disk disease   . Diabetes mellitus without complication ([place])   . Hypertension   . Hypothyroidism   . On home O2    2.5 L N/C prn  .  Pedal edema     Past Surgical History:  Procedure Laterality Date  . CORONARY STENT INTERVENTION N/A 05/22/2017   Procedure: CORONARY STENT INTERVENTION;  Surgeon: Martinique, Peter M, MD;  Location: [facility];  Service: Cardiovascular;  Laterality: N/A;  . HERNIA REPAIR    . RIGHT/LEFT HEART CATH AND CORONARY ANGIOGRAPHY N/A 05/19/2017   Procedure: RIGHT/LEFT HEART CATH AND CORONARY ANGIOGRAPHY;  Surgeon: Leonie Man, MD;  Location: [place];  Service: Cardiovascular;  Laterality: N/A;  . TUBAL  LIGATION      Current Medications: Outpatient Medications Prior to Visit  Medication Sig Dispense Refill  . albuterol (PROAIR HFA) 108 (90 BASE) MCG/ACT inhaler Inhale 1 puff into the lungs every 4 (four) hours as needed. For shortness of breath (Patient taking differently: Inhale 2 puffs into the lungs every 4 (four) hours as needed. For shortness of breath) 1 Inhaler 1  . albuterol (PROVENTIL) (2.5 MG/3ML) 0.083% nebulizer solution Take 2.5 mg by nebulization 2 (two) times daily as needed for wheezing or shortness of breath.     . ALPRAZolam (XANAX) 1 MG tablet Take 1 mg by mouth 3 (three) times daily as needed for anxiety or sleep.     Marland Kitchen aspirin EC 81 MG tablet Take 81 mg by mouth daily.    Marland Kitchen atorvastatin (LIPITOR) 80 MG tablet Take 1 tablet (80 mg total) by mouth daily at 6 PM. (Patient taking differently: Take 80 mg by mouth every morning. ) 30 tablet 0  . Cholecalciferol (VITAMIN D3) 5000 units CAPS Take 1 capsule (5,000 Units total) by mouth daily. 90 capsule 0  . clopidogrel (PLAVIX) 75 MG tablet Take 1 tablet (75 mg total) by mouth daily. 30 tablet 0  . docusate sodium (COLACE) 100 MG capsule Take 300 mg by mouth daily.    . ferrous sulfate 325 (65 FE) MG tablet Take 1 tablet (325 mg total) by mouth daily with breakfast. 30 tablet 3  . furosemide (LASIX) 20 MG tablet Take 20 mg by mouth 2 (two) times daily.    Marland Kitchen gabapentin (NEURONTIN) 300 MG capsule Take 1 capsule (300 mg total) by mouth 3 (three) times daily. 90 capsule 0  . Hydrocortisone (GERHARDT'S BUTT CREAM) CREA Apply 1 application topically 3 (three) times daily. 90 each 0  . insulin aspart (NOVOLOG) 100 UNIT/ML injection Inject 0-9 Units into the skin 3 (three) times daily with meals. (Patient taking differently: Inject 1-18 Units into the skin 3 (three) times daily with meals. Per sliding scale) 10 mL 11  . insulin detemir (LEVEMIR) 100 UNIT/ML injection Inject 0.52 mLs (52 Units total) into the skin at bedtime. 10 mL 11  .  levothyroxine (SYNTHROID, LEVOTHROID) 150 MCG tablet Take 1 tablet (150 mcg total) by mouth daily at 6 (six) AM. 30 tablet 2  . nystatin (MYCOSTATIN/NYSTOP) powder Apply topically 2 (two) times daily. 15 g 0  . Omega-3 Fatty Acids (FISH OIL PO) Take 1 capsule by mouth daily.     . ondansetron (ZOFRAN) 4 MG tablet Take 4 mg by mouth every 8 (eight) hours as needed for vomiting.     Marland Kitchen oxyCODONE-acetaminophen (PERCOCET) 10-325 MG tablet Take 1 tablet by mouth every 6 (six) hours as needed for pain.    . OXYGEN Inhale 2 L into the lungs continuous.    . pantoprazole (PROTONIX) 40 MG tablet Take 1 tablet (40 mg total) by mouth daily. 30 tablet 0  . polyethylene glycol (MIRALAX / GLYCOLAX) packet Take 17  g by mouth daily as needed for mild constipation or moderate constipation. 14 each 0  . potassium chloride (MICRO-K) 10 MEQ CR capsule Take 10 mEq by mouth 2 (two) times daily.    . SURE COMFORT INS SYR 1CC/28G 28G X 1/2" 1 ML MISC USE TO INJECT INSULIN UP TO 4 TIMES DAILY. 150 each 5  . tiZANidine (ZANAFLEX) 4 MG tablet Take 8 mg by mouth 2 (two) times daily.     Marland Kitchen topiramate (TOPAMAX) 25 MG tablet Take 75 mg by mouth daily.     Marland Kitchen umeclidinium-vilanterol (ANORO ELLIPTA) 62.5-25 MCG/INH AEPB Inhale 1 puff into the lungs daily. 30 each 0  . VICTOZA 18 MG/3ML SOPN Inject 1.2 mg into the skin daily.      No facility-administered medications prior to visit.      Allergies:   Dilaudid [hydromorphone hcl]; Actifed cold-allergy [chlorpheniramine-phenylephrine]; Doxycycline; Other; Penicillins; Reglan [metoclopramide]; Valium [diazepam]; and Vistaril [hydroxyzine hcl]   Social History   Socioeconomic History  . Marital status: Widowed    Spouse name: Not on file  . Number of children: Not on file  . Years of education: Not on file  . Highest education level: Not on file  Occupational History  . Not on file  Social Needs  . Financial resource strain: Not on file  . Food insecurity:    Worry: Not on  file    Inability: Not on file  . Transportation needs:    Medical: Not on file    Non-medical: Not on file  Tobacco Use  . Smoking status: Former Smoker    Packs/day: 0.25    Types: Cigarettes    Last attempt to quit: 11/24/2014    Years since quitting: 2.9  . Smokeless tobacco: Never Used  Substance and Sexual Activity  . Alcohol use: No    Alcohol/week: 0.0 oz  . Drug use: No  . Sexual activity: Not Currently    Birth control/protection: Surgical  Lifestyle  . Physical activity:    Days per week: Not on file    Minutes per session: Not on file  . Stress: Not on file  Relationships  . Social connections:    Talks on phone: Not on file    Gets together: Not on file    Attends religious service: Not on file    Active member of club or organization: Not on file    Attends meetings of clubs or organizations: Not on file    Relationship status: Not on file  Other Topics Concern  . Not on file  Social History Narrative  . Not on file     Family History:  The patient's family history includes Cerebral palsy in her brother; Diabetes in her brother and other; Heart attack in her father and other; Pneumonia in her brother; Stroke in her mother.   Review of Systems:   Please see the history of present illness.     General:  No chills, fever, night sweats or weight changes. Positive for recent syncopal events.  Cardiovascular:  No dyspnea on exertion, edema, orthopnea, palpitations, paroxysmal nocturnal dyspnea. Positive for chest pain (after consuming food).  Dermatological: No rash, lesions/masses Respiratory: No cough, dyspnea Urologic: No hematuria, dysuria Abdominal:   No nausea, vomiting, diarrhea, bright red blood per rectum, melena, or hematemesis Neurologic:  No visual changes, wkns, changes in mental status. All other systems reviewed and are otherwise negative except as noted above.   Physical Exam:    VS:  BP 126/70 (  BP Location: Left Arm)   Pulse 84   Ht 5'  6" (1.676 m)   Wt 294 lb (133.4 kg)   SpO2 93%   BMI 47.45 kg/m    General: Well developed, well nourished Caucasian female appearing in no acute distress. Head: Normocephalic, atraumatic, sclera non-icteric, no xanthomas, nares are without discharge.  Neck: No carotid bruits. JVD not elevated.  Lungs: Respirations regular and unlabored, without wheezes or rales.  Heart: Regular rate and rhythm. No S3 or S4.  No murmur, no rubs, or gallops appreciated. Abdomen: Soft, non-tender, non-distended with normoactive bowel sounds. No hepatomegaly. No rebound/guarding. No obvious abdominal masses. Msk:  Strength and tone appear normal for age. No joint deformities or effusions. Extremities: No clubbing or cyanosis. 1+ pitting edema bilaterally.  Distal pedal pulses are 2+ bilaterally. Neuro: Alert and oriented X 3. Moves all extremities spontaneously. No focal deficits noted. Psych:  Responds to questions appropriately with a normal affect. Skin: No rashes or lesions noted  Wt Readings from Last 3 Encounters:  11/09/17 294 lb (133.4 kg)  10/26/17 239 lb (108.4 kg)  10/25/17 296 lb 8.3 oz (134.5 kg)    Studies/Labs Reviewed:   EKG:  EKG is not ordered today.   Recent Labs: 05/01/2017: B Natriuretic Peptide 108.0 10/23/2017: ALT 20; TSH 173.254 10/24/2017: BUN 30; Creatinine, Ser 1.56; Hemoglobin 9.3; Magnesium 2.1; Platelets 254; Potassium 4.1; Sodium 138   Lipid Panel    Component Value Date/Time   CHOL 164 05/19/2017 0622   TRIG 104 05/19/2017 0622   HDL 36 (L) 05/19/2017 0622   CHOLHDL 4.6 05/19/2017 0622   VLDL 21 05/19/2017 0622   LDLCALC 107 (H) 05/19/2017 0622    Additional studies/ records that were reviewed today include:   Echocardiogram: 10/24/2017 Study Conclusions  - Left ventricle: The cavity size was normal. Wall thickness was   increased in a pattern of moderate LVH. Systolic function was   vigorous. The estimated ejection fraction was in the range of 65%   to  70%. Doppler parameters are consistent with abnormal left   ventricular relaxation (grade 1 diastolic dysfunction). Doppler   parameters are consistent with high ventricular filling pressure. - Aortic valve: Mildly calcified annulus. Trileaflet; normal   thickness leaflets. Valve area (VTI): 1.87 cm^2. Valve area   (Vmax): 1.87 cm^2. Valve area (Vmean): 1.9 cm^2. - Mitral valve: Mildly calcified annulus. Mildly thickened leaflets   . There was mild regurgitation. - Left atrium: The atrium was moderately dilated. - Atrial septum: No defect or patent foramen ovale was identified. - Pulmonary arteries: Systolic pressure was moderately increased.   PA peak pressure: 41 mm Hg (S). - Technically adequate study.  Event Monitor: 09/2017  Sinus rhythm. Average heart rate 72 bpm. Occasional PACs with isolated atrial runs. No significant arrhythmias.  Assessment:    1. Syncope, unspecified syncope type   2. Coronary artery disease involving native coronary artery of native heart with other form of angina pectoris ([place])   3. Chronic diastolic CHF (congestive heart failure) ([place])   4. HTN (hypertension), benign   5. Hyperlipidemia LDL goal <70   6. Medication management   7. Hypothyroidism, unspecified type   8. CKD (chronic kidney disease), stage III ([place])      Plan:   In order of problems listed above:  1. Recurrent Syncopal Events - she has been admitted to the hospital over 3 times within the past few months for recurrent syncopal events. An event monitor was obtained and showed  normal sinus rhythm with occasional PAC's and isolated atrial runs with no significant arrhythmias. TSH was found to be significantly elevated at 199.45 in 10/2017 and Synthroid dosing was appropriately adjusted. Was also bradycardiac during her admission last month and Lopressor was discontinued.  - she denies any recurrent events since her most recent admission. Symptoms were overall thought to be most  consistent with orthostasis and in the setting of her significant hypothyroidism. No further testing currently indicated. She will remain off AV nodal blocking agents at this time given recent bradycardia.   2. CAD - s/p DES to LAD and angioplasty to D1 in 05/2017. She denies any recent exertional chest pain. Does report episodes of sternal discomfort after food consumption which improves with the use of Zantac. Recommended she consider taking her PPI in the evening hours as her symptoms most commonly occur at night. Recent echo showed a preserved EF with no regional WMA. No plans for further ischemic evaluation at this time. Of note, she was previously on Imdur and Lopressor but these were discontinued over the course of her recent admissions due to hypotension and bradycardia. - remains on ASA, Plavix, and statin therapy.   3. Chronic Diastolic CHF - recent echocardiogram in 10/2017 showed a preserved EF of 65-70% with Grade 1 DD. She denies any recent changes in her respiratory status and lungs are clear on examination. Does have chronic lower extremity edema.  - continue Lasix 20mg  BID. Will recheck a BMET today to assess kidney function and K+ levels.   4. HTN - BP is overall well-controlled at 126/70 during today's visit. Continue current medication regimen.   5. HLD - followed by PCP. Goal LDL is < 70 in the setting of known CAD. Remains on Atorvastatin 80mg  daily.   6. Hypothyroidism - TSH elevated to 173.25 during her recent hospitalization and Synthroid dosing was appropriately adjusted. Being followed by her PCP. Due for repeat labs next week.   7. Stage 3 CKD  - baseline creatinine 1.4 - 1.5. Overall stable at 1.56 on 10/24/2017. Will recheck BMET at the time of her labs next week following resumption of Lasix.    Medication Adjustments/Labs and Tests Ordered: Current medicines are reviewed at length with the patient today.  Concerns regarding medicines are outlined above.   Medication changes, Labs and Tests ordered today are listed in the Patient Instructions below. Patient Instructions  Medication Instructions:  Your physician recommends that you continue on your current medications as directed. Please refer to the Current Medication list given to you today.   Labwork: BMET   Testing/Procedures: NONE  Follow-Up: Your physician recommends that you schedule a follow-up appointment in: 2-3 MONTHS    Any Other Special Instructions Will Be Listed Below (If Applicable).   If you need a refill on your cardiac medications before your next appointment, please call your pharmacy.   Signed, Erma Heritage, PA-C  11/09/2017 7:22 PM    [facility] [address] [place], [place] [address] Phone: [phone]

## 2017-11-09 ENCOUNTER — Encounter: Payer: Self-pay | Admitting: Student

## 2017-11-09 ENCOUNTER — Ambulatory Visit: Payer: Medicare HMO | Admitting: Student

## 2017-11-09 VITALS — BP 126/70 | HR 84 | Ht 66.0 in | Wt 294.0 lb

## 2017-11-09 DIAGNOSIS — I5032 Chronic diastolic (congestive) heart failure: Secondary | ICD-10-CM

## 2017-11-09 DIAGNOSIS — Z79899 Other long term (current) drug therapy: Secondary | ICD-10-CM

## 2017-11-09 DIAGNOSIS — N183 Chronic kidney disease, stage 3 unspecified: Secondary | ICD-10-CM

## 2017-11-09 DIAGNOSIS — E039 Hypothyroidism, unspecified: Secondary | ICD-10-CM

## 2017-11-09 DIAGNOSIS — I10 Essential (primary) hypertension: Secondary | ICD-10-CM | POA: Diagnosis not present

## 2017-11-09 DIAGNOSIS — Z9114 Patient's other noncompliance with medication regimen: Secondary | ICD-10-CM

## 2017-11-09 DIAGNOSIS — R55 Syncope and collapse: Secondary | ICD-10-CM | POA: Diagnosis not present

## 2017-11-09 DIAGNOSIS — I25118 Atherosclerotic heart disease of native coronary artery with other forms of angina pectoris: Secondary | ICD-10-CM | POA: Diagnosis not present

## 2017-11-09 DIAGNOSIS — E785 Hyperlipidemia, unspecified: Secondary | ICD-10-CM

## 2017-11-09 DIAGNOSIS — Z91148 Patient's other noncompliance with medication regimen for other reason: Secondary | ICD-10-CM

## 2017-11-09 HISTORY — DX: Patient's other noncompliance with medication regimen: Z91.14

## 2017-11-09 HISTORY — DX: Patient's other noncompliance with medication regimen for other reason: Z91.148

## 2017-11-09 NOTE — Patient Instructions (Signed)
Medication Instructions:  Your physician recommends that you continue on your current medications as directed. Please refer to the Current Medication list given to you today.   Labwork: BMET   Testing/Procedures: NONE  Follow-Up: Your physician recommends that you schedule a follow-up appointment in: 2-3 MONTHS   Any Other Special Instructions Will Be Listed Below (If Applicable).     If you need a refill on your cardiac medications before your next appointment, please call your pharmacy.   

## 2017-11-10 ENCOUNTER — Other Ambulatory Visit: Payer: Self-pay | Admitting: *Deleted

## 2017-11-10 NOTE — Patient Outreach (Addendum)
[facility]) Care Management  11/10/2017  Stephanie Combs 28-Jun-1950 [id number]   Home visit to follow up on chronic diseases and medication management. Pt went to cardiologist yesterday.  BP (!) 150/60   Pulse 68   Resp 18   Wt 294 lb (133.4 kg)   SpO2 98%   BMI 47.45 kg/m  FBS:107  Heart: Irregular Lungs: clear Extremities: R foot 3+, L foot 1+  Prepared MOST form: Pt does NOT want to be resuciitated. We also completed a HCPOA and Living Will, which pt needs to have notarized and copies made and distributed to doctors and family members.  Provided [facility] Planning and Monitoring Calendar.  Encouraged pt to call if any problems arise.  I will see pt again in 2 weeks.   [facility] CM Care Plan Problem One     Most Recent Value  Care Plan Problem One  Medication descrepancies  Role Documenting the Problem One  Care Management Coordinator  Care Plan for Problem One  Active  [facility] CM Short Term Goal #1   Pt will verify that MD has called and advised her about taking furosemide and potassium which are not on med list within the next 3 days.  [facility] CM Short Term Goal #1 Start Date  10/27/17  [facility] CM Short Term Goal #1 Met Date  10/30/17  Interventions for Short Term Goal #1  Called primary and cardiologist leaving messages about pt medication regimen asking to advise if they wanted her to do otherwise.  [facility] CM Short Term Goal #2   Reorganize medications into meds currently taken and meds not currently taking and store these in a safe place. Pt is unwilling to remove these as often she is restated on meds.  [facility] CM Short Term Goal #2 Met Date  11/10/17    [facility] CM Care Plan Problem Two     Most Recent Value  Care Plan Problem Two  Complete Advanced Directives.  Role Documenting the Problem Two  Care Management Coordinator  Care Plan for Problem Two  Active  [facility] CM Short Term Goal #1   Pt will complete a MOST form.  [facility] CM Short Term Goal #1 Start Date  10/27/17  [facility] CM Short  Term Goal #1 Met Date   11/10/17  [facility] CM Short Term Goal #2   Pt will complete her HCPOA and Living  Will over the next 30 days.  [facility] CM Short Term Goal #2 Start Date  10/27/17  Interventions for Short Term Goal #2  Provided forms, completed all but signature page. Advised to take to a notary and then make copies giving them to MDs and designated [employer] C. Myrtie Neither, MSN, [facility] Gerontological Nurse Practitioner [facility] Care Management [phone]

## 2017-11-13 ENCOUNTER — Other Ambulatory Visit: Payer: Self-pay

## 2017-11-13 NOTE — Patient Outreach (Addendum)
   [facility]) Care Management  11/13/2017  BINTOU LAFATA 01-17-1951 [id number]  67year old malereferred to [place] Management by[facility] hospital liason. [place] services requested for medication assistance for insulins. PMHx includes, but not limited to, hypertension, diastolic CHF, angina, syncope, COPD, Type 2 diabetes mellitus, hypothyroidism and stage III chronic kidneys disease.  Unsuccessful outreach attempt to Ms. Linson, despite this being a scheduled time that she requested that I outreach to her to discuss her medications.   I have been attempting outreach to Ms. Scull since 6/10, encompassing six outreach calls with scheduled calls that were either not answered or rescheduled.    Left HIPAA compliant voice message, informing Ms. Korinek that I will close her patient case, as I have been unable to maintain contact with her.  I left my phone number in case she desires to return my call.  Plan: Route discipline closure letter to PCP, Dr. Maudie Mercury.  Inform [facility] NP, Deloria Lair of Pharmacy case closure.  Joetta Manners, PharmD Clinical Pharmacist [place] [phone]

## 2017-11-24 ENCOUNTER — Ambulatory Visit: Payer: Self-pay | Admitting: *Deleted

## 2017-11-24 ENCOUNTER — Other Ambulatory Visit: Payer: Self-pay

## 2017-11-24 ENCOUNTER — Inpatient Hospital Stay (HOSPITAL_COMMUNITY)
Admission: EM | Admit: 2017-11-24 | Discharge: 2017-11-26 | DRG: 312 | Disposition: A | Discharge status: Home Health | Payer: Medicare HMO | Attending: Internal Medicine | Admitting: Internal Medicine

## 2017-11-24 ENCOUNTER — Encounter (HOSPITAL_COMMUNITY): Payer: Self-pay

## 2017-11-24 ENCOUNTER — Emergency Department (HOSPITAL_COMMUNITY): Payer: Medicare HMO

## 2017-11-24 DIAGNOSIS — Z888 Allergy status to other drugs, medicaments and biological substances status: Secondary | ICD-10-CM

## 2017-11-24 DIAGNOSIS — Z880 Allergy status to penicillin: Secondary | ICD-10-CM

## 2017-11-24 DIAGNOSIS — Z87891 Personal history of nicotine dependence: Secondary | ICD-10-CM

## 2017-11-24 DIAGNOSIS — E039 Hypothyroidism, unspecified: Secondary | ICD-10-CM | POA: Diagnosis present

## 2017-11-24 DIAGNOSIS — Z9981 Dependence on supplemental oxygen: Secondary | ICD-10-CM

## 2017-11-24 DIAGNOSIS — Z7902 Long term (current) use of antithrombotics/antiplatelets: Secondary | ICD-10-CM

## 2017-11-24 DIAGNOSIS — Z885 Allergy status to narcotic agent status: Secondary | ICD-10-CM

## 2017-11-24 DIAGNOSIS — I10 Essential (primary) hypertension: Secondary | ICD-10-CM | POA: Diagnosis present

## 2017-11-24 DIAGNOSIS — IMO0002 Reserved for concepts with insufficient information to code with codable children: Secondary | ICD-10-CM | POA: Diagnosis present

## 2017-11-24 DIAGNOSIS — E1122 Type 2 diabetes mellitus with diabetic chronic kidney disease: Secondary | ICD-10-CM | POA: Diagnosis present

## 2017-11-24 DIAGNOSIS — E1165 Type 2 diabetes mellitus with hyperglycemia: Secondary | ICD-10-CM | POA: Diagnosis not present

## 2017-11-24 DIAGNOSIS — F419 Anxiety disorder, unspecified: Secondary | ICD-10-CM | POA: Diagnosis not present

## 2017-11-24 DIAGNOSIS — N183 Chronic kidney disease, stage 3 unspecified: Secondary | ICD-10-CM | POA: Diagnosis present

## 2017-11-24 DIAGNOSIS — R55 Syncope and collapse: Secondary | ICD-10-CM | POA: Diagnosis present

## 2017-11-24 DIAGNOSIS — J9611 Chronic respiratory failure with hypoxia: Secondary | ICD-10-CM | POA: Diagnosis not present

## 2017-11-24 DIAGNOSIS — I130 Hypertensive heart and chronic kidney disease with heart failure and stage 1 through stage 4 chronic kidney disease, or unspecified chronic kidney disease: Secondary | ICD-10-CM | POA: Diagnosis present

## 2017-11-24 DIAGNOSIS — Z6841 Body Mass Index (BMI) 40.0 and over, adult: Secondary | ICD-10-CM

## 2017-11-24 DIAGNOSIS — E11649 Type 2 diabetes mellitus with hypoglycemia without coma: Secondary | ICD-10-CM

## 2017-11-24 DIAGNOSIS — G894 Chronic pain syndrome: Secondary | ICD-10-CM

## 2017-11-24 DIAGNOSIS — Z881 Allergy status to other antibiotic agents status: Secondary | ICD-10-CM

## 2017-11-24 DIAGNOSIS — E66813 Obesity, class 3: Secondary | ICD-10-CM | POA: Diagnosis present

## 2017-11-24 DIAGNOSIS — Z7982 Long term (current) use of aspirin: Secondary | ICD-10-CM

## 2017-11-24 DIAGNOSIS — K219 Gastro-esophageal reflux disease without esophagitis: Secondary | ICD-10-CM | POA: Diagnosis present

## 2017-11-24 DIAGNOSIS — Z7951 Long term (current) use of inhaled steroids: Secondary | ICD-10-CM

## 2017-11-24 DIAGNOSIS — I2510 Atherosclerotic heart disease of native coronary artery without angina pectoris: Secondary | ICD-10-CM | POA: Diagnosis present

## 2017-11-24 DIAGNOSIS — E785 Hyperlipidemia, unspecified: Secondary | ICD-10-CM | POA: Diagnosis present

## 2017-11-24 DIAGNOSIS — I5032 Chronic diastolic (congestive) heart failure: Secondary | ICD-10-CM | POA: Diagnosis present

## 2017-11-24 DIAGNOSIS — Z79899 Other long term (current) drug therapy: Secondary | ICD-10-CM

## 2017-11-24 DIAGNOSIS — Z955 Presence of coronary angioplasty implant and graft: Secondary | ICD-10-CM

## 2017-11-24 DIAGNOSIS — E162 Hypoglycemia, unspecified: Secondary | ICD-10-CM

## 2017-11-24 DIAGNOSIS — Z794 Long term (current) use of insulin: Secondary | ICD-10-CM

## 2017-11-24 DIAGNOSIS — D649 Anemia, unspecified: Secondary | ICD-10-CM | POA: Diagnosis present

## 2017-11-24 DIAGNOSIS — J438 Other emphysema: Secondary | ICD-10-CM

## 2017-11-24 DIAGNOSIS — J449 Chronic obstructive pulmonary disease, unspecified: Secondary | ICD-10-CM | POA: Diagnosis present

## 2017-11-24 DIAGNOSIS — R001 Bradycardia, unspecified: Secondary | ICD-10-CM | POA: Diagnosis present

## 2017-11-24 DIAGNOSIS — I890 Lymphedema, not elsewhere classified: Secondary | ICD-10-CM | POA: Diagnosis present

## 2017-11-24 DIAGNOSIS — Z79891 Long term (current) use of opiate analgesic: Secondary | ICD-10-CM

## 2017-11-24 LAB — CBC WITH DIFFERENTIAL/PLATELET
Basophils Absolute: 0.1 10*3/uL (ref 0.0–0.1)
Basophils Relative: 2 %
Eosinophils Absolute: 0.2 10*3/uL (ref 0.0–0.7)
Eosinophils Relative: 3 %
HCT: 29.6 % — ABNORMAL LOW (ref 36.0–46.0)
Hemoglobin: 9 g/dL — ABNORMAL LOW (ref 12.0–15.0)
Lymphocytes Relative: 26 %
Lymphs Abs: 2.1 10*3/uL (ref 0.7–4.0)
MCH: 27.9 pg (ref 26.0–34.0)
MCHC: 30.4 g/dL (ref 30.0–36.0)
MCV: 91.6 fL (ref 78.0–100.0)
Monocytes Absolute: 0.8 10*3/uL (ref 0.1–1.0)
Monocytes Relative: 10 %
Neutro Abs: 4.8 10*3/uL (ref 1.7–7.7)
Neutrophils Relative %: 59 %
Platelets: 273 10*3/uL (ref 150–400)
RBC: 3.23 MIL/uL — ABNORMAL LOW (ref 3.87–5.11)
RDW: 15.1 % (ref 11.5–15.5)
WBC: 8.1 10*3/uL (ref 4.0–10.5)

## 2017-11-24 LAB — COMPREHENSIVE METABOLIC PANEL
ALT: 10 U/L (ref 0–44)
AST: 17 U/L (ref 15–41)
Albumin: 3.3 g/dL — ABNORMAL LOW (ref 3.5–5.0)
Alkaline Phosphatase: 84 U/L (ref 38–126)
Anion gap: 8 (ref 5–15)
BUN: 30 mg/dL — ABNORMAL HIGH (ref 8–23)
CO2: 26 mmol/L (ref 22–32)
Calcium: 8.1 mg/dL — ABNORMAL LOW (ref 8.9–10.3)
Chloride: 108 mmol/L (ref 98–111)
Creatinine, Ser: 1.37 mg/dL — ABNORMAL HIGH (ref 0.44–1.00)
GFR calc Af Amer: 45 mL/min — ABNORMAL LOW (ref >60)
GFR calc non Af Amer: 39 mL/min — ABNORMAL LOW (ref >60)
Glucose, Bld: 70 mg/dL (ref 70–99)
Potassium: 3.7 mmol/L (ref 3.5–5.1)
Sodium: 142 mmol/L (ref 135–145)
Total Bilirubin: 0.7 mg/dL (ref 0.3–1.2)
Total Protein: 6.3 g/dL — ABNORMAL LOW (ref 6.5–8.1)

## 2017-11-24 LAB — TSH: TSH: 34.012 u[IU]/mL — ABNORMAL HIGH (ref 0.350–4.500)

## 2017-11-24 LAB — TROPONIN I
Troponin I: <0.03 ng/mL (ref <0.03)
Troponin I: <0.03 ng/mL (ref <0.03)
Troponin I: <0.03 ng/mL (ref <0.03)

## 2017-11-24 LAB — GLUCOSE, CAPILLARY
Glucose-Capillary: 181 mg/dL — ABNORMAL HIGH (ref 70–99)
Glucose-Capillary: 233 mg/dL — ABNORMAL HIGH (ref 70–99)

## 2017-11-24 LAB — CORTISOL: Cortisol, Plasma: 6.9 ug/dL

## 2017-11-24 LAB — CBG MONITORING, ED
Glucose-Capillary: 76 mg/dL (ref 70–99)
Glucose-Capillary: 42 mg/dL — CL (ref 70–99)
Glucose-Capillary: 130 mg/dL — ABNORMAL HIGH (ref 70–99)

## 2017-11-24 MED ORDER — GABAPENTIN 300 MG PO CAPS
300.0000 mg | ORAL_CAPSULE | Freq: Three times a day (TID) | ORAL | Status: DC
  Administered 2017-11-24 – 2017-11-26 (×6): 300 mg via ORAL
  Filled 2017-11-24: qty 1
  Filled 2017-11-24: qty 3
  Filled 2017-11-24 (×4): qty 1

## 2017-11-24 MED ORDER — PANTOPRAZOLE SODIUM 40 MG PO TBEC
40.0000 mg | DELAYED_RELEASE_TABLET | Freq: Every day | ORAL | Status: DC
  Administered 2017-11-25 – 2017-11-26 (×2): 40 mg via ORAL
  Filled 2017-11-24 (×2): qty 1

## 2017-11-24 MED ORDER — SODIUM CHLORIDE 0.9% FLUSH
3.0000 mL | Freq: Two times a day (BID) | INTRAVENOUS | Status: DC
  Administered 2017-11-24 – 2017-11-26 (×4): 3 mL via INTRAVENOUS

## 2017-11-24 MED ORDER — CLOPIDOGREL BISULFATE 75 MG PO TABS
75.0000 mg | ORAL_TABLET | Freq: Every day | ORAL | Status: DC
  Administered 2017-11-25 – 2017-11-26 (×2): 75 mg via ORAL
  Filled 2017-11-24 (×2): qty 1

## 2017-11-24 MED ORDER — ATORVASTATIN CALCIUM 40 MG PO TABS
80.0000 mg | ORAL_TABLET | Freq: Every day | ORAL | Status: DC
  Administered 2017-11-24 – 2017-11-25 (×2): 80 mg via ORAL
  Filled 2017-11-24 (×2): qty 2

## 2017-11-24 MED ORDER — POTASSIUM CHLORIDE CRYS ER 20 MEQ PO TBCR
20.0000 meq | EXTENDED_RELEASE_TABLET | Freq: Every day | ORAL | Status: DC
  Administered 2017-11-25 – 2017-11-26 (×2): 20 meq via ORAL
  Filled 2017-11-24 (×2): qty 1

## 2017-11-24 MED ORDER — INSULIN ASPART 100 UNIT/ML ~~LOC~~ SOLN
0.0000 [IU] | Freq: Three times a day (TID) | SUBCUTANEOUS | Status: DC
  Administered 2017-11-25 (×2): 3 [IU] via SUBCUTANEOUS
  Administered 2017-11-25 – 2017-11-26 (×3): 5 [IU] via SUBCUTANEOUS

## 2017-11-24 MED ORDER — ASPIRIN EC 81 MG PO TBEC
81.0000 mg | DELAYED_RELEASE_TABLET | Freq: Every day | ORAL | Status: DC
  Administered 2017-11-25 – 2017-11-26 (×2): 81 mg via ORAL
  Filled 2017-11-24 (×2): qty 1

## 2017-11-24 MED ORDER — FERROUS SULFATE 325 (65 FE) MG PO TABS
325.0000 mg | ORAL_TABLET | Freq: Every day | ORAL | Status: DC
  Administered 2017-11-25 – 2017-11-26 (×2): 325 mg via ORAL
  Filled 2017-11-24 (×3): qty 1

## 2017-11-24 MED ORDER — POLYETHYLENE GLYCOL 3350 17 G PO PACK
17.0000 g | PACK | Freq: Every day | ORAL | Status: DC | PRN
  Administered 2017-11-25: 17 g via ORAL
  Filled 2017-11-24: qty 1

## 2017-11-24 MED ORDER — DOCUSATE SODIUM 100 MG PO CAPS
300.0000 mg | ORAL_CAPSULE | Freq: Every day | ORAL | Status: DC
  Administered 2017-11-25 – 2017-11-26 (×2): 300 mg via ORAL
  Filled 2017-11-24 (×2): qty 3

## 2017-11-24 MED ORDER — UMECLIDINIUM-VILANTEROL 62.5-25 MCG/INH IN AEPB
1.0000 | INHALATION_SPRAY | Freq: Every day | RESPIRATORY_TRACT | Status: DC
  Administered 2017-11-25 – 2017-11-26 (×2): 1 via RESPIRATORY_TRACT
  Filled 2017-11-24: qty 14

## 2017-11-24 MED ORDER — ACETAMINOPHEN 325 MG PO TABS
650.0000 mg | ORAL_TABLET | Freq: Four times a day (QID) | ORAL | Status: DC | PRN
  Administered 2017-11-25: 650 mg via ORAL
  Filled 2017-11-24: qty 2

## 2017-11-24 MED ORDER — OXYCODONE-ACETAMINOPHEN 5-325 MG PO TABS
1.0000 | ORAL_TABLET | Freq: Three times a day (TID) | ORAL | Status: DC | PRN
  Administered 2017-11-24 – 2017-11-26 (×5): 1 via ORAL
  Filled 2017-11-24 (×5): qty 1

## 2017-11-24 MED ORDER — OXYCODONE HCL 5 MG PO TABS
5.0000 mg | ORAL_TABLET | Freq: Three times a day (TID) | ORAL | Status: DC | PRN
  Administered 2017-11-24 – 2017-11-26 (×5): 5 mg via ORAL
  Filled 2017-11-24 (×5): qty 1

## 2017-11-24 MED ORDER — SODIUM CHLORIDE 0.9 % IV SOLN: 250.0000 mL | INTRAVENOUS | Status: DC | PRN

## 2017-11-24 MED ORDER — ALPRAZOLAM 0.5 MG PO TABS
0.5000 mg | ORAL_TABLET | Freq: Three times a day (TID) | ORAL | Status: DC | PRN
  Administered 2017-11-24 – 2017-11-26 (×5): 0.5 mg via ORAL
  Filled 2017-11-24 (×5): qty 1

## 2017-11-24 MED ORDER — ONDANSETRON HCL 4 MG PO TABS
4.0000 mg | ORAL_TABLET | Freq: Four times a day (QID) | ORAL | Status: DC | PRN
  Administered 2017-11-25 – 2017-11-26 (×2): 4 mg via ORAL
  Filled 2017-11-24 (×2): qty 1

## 2017-11-24 MED ORDER — DEXTROSE 50 % IV SOLN
1.0000 | Freq: Once | INTRAVENOUS | Status: AC
  Administered 2017-11-24: 50 mL via INTRAVENOUS

## 2017-11-24 MED ORDER — ONDANSETRON HCL 4 MG/2ML IJ SOLN
4.0000 mg | Freq: Four times a day (QID) | INTRAMUSCULAR | Status: DC | PRN
  Administered 2017-11-24 – 2017-11-25 (×3): 4 mg via INTRAVENOUS
  Filled 2017-11-24 (×3): qty 2

## 2017-11-24 MED ORDER — OXYCODONE-ACETAMINOPHEN 10-325 MG PO TABS: 1.0000 | ORAL_TABLET | Freq: Three times a day (TID) | ORAL | Status: DC | PRN

## 2017-11-24 MED ORDER — ACETAMINOPHEN 650 MG RE SUPP: 650.0000 mg | Freq: Four times a day (QID) | RECTAL | Status: DC | PRN

## 2017-11-24 MED ORDER — DEXTROSE 50 % IV SOLN
INTRAVENOUS | Status: AC
  Filled 2017-11-24: qty 50

## 2017-11-24 MED ORDER — SODIUM CHLORIDE 0.9 % IV BOLUS
500.0000 mL | Freq: Once | INTRAVENOUS | Status: AC
  Administered 2017-11-24: 500 mL via INTRAVENOUS

## 2017-11-24 MED ORDER — ALBUTEROL SULFATE (2.5 MG/3ML) 0.083% IN NEBU: 2.5000 mg | INHALATION_SOLUTION | Freq: Four times a day (QID) | RESPIRATORY_TRACT | Status: DC | PRN

## 2017-11-24 MED ORDER — VITAMIN D 1000 UNITS PO TABS
5000.0000 [IU] | ORAL_TABLET | Freq: Every day | ORAL | Status: DC
  Administered 2017-11-25 – 2017-11-26 (×2): 5000 [IU] via ORAL
  Filled 2017-11-24 (×2): qty 5

## 2017-11-24 MED ORDER — OMEGA-3-ACID ETHYL ESTERS 1 G PO CAPS
1.0000 g | ORAL_CAPSULE | Freq: Every day | ORAL | Status: DC
  Administered 2017-11-25 – 2017-11-26 (×2): 1 g via ORAL
  Filled 2017-11-24 (×2): qty 1

## 2017-11-24 MED ORDER — LEVOTHYROXINE SODIUM 75 MCG PO TABS
175.0000 ug | ORAL_TABLET | Freq: Every day | ORAL | Status: DC
Start: 2017-11-25 — End: 2017-11-26
  Administered 2017-11-25 – 2017-11-26 (×2): 175 ug via ORAL
  Filled 2017-11-24 (×3): qty 1

## 2017-11-24 MED ORDER — SODIUM CHLORIDE 0.9% FLUSH: 3.0000 mL | INTRAVENOUS | Status: DC | PRN

## 2017-11-24 MED ORDER — HEPARIN SODIUM (PORCINE) 5000 UNIT/ML IJ SOLN
5000.0000 [IU] | Freq: Three times a day (TID) | INTRAMUSCULAR | Status: DC
  Administered 2017-11-24 – 2017-11-26 (×5): 5000 [IU] via SUBCUTANEOUS
  Filled 2017-11-24 (×5): qty 1

## 2017-11-24 MED ORDER — NYSTATIN 100000 UNIT/GM EX POWD
Freq: Two times a day (BID) | CUTANEOUS | Status: DC
  Administered 2017-11-24 – 2017-11-26 (×4): via TOPICAL
  Filled 2017-11-24: qty 15

## 2017-11-24 NOTE — ED Notes (Signed)
EDP made aware of CBG 42. PT given a meal tray. PT alert and oriented.

## 2017-11-24 NOTE — ED Triage Notes (Signed)
EMS reports was called out for fall.  EMs says pt fell out of bed.  They arrived to find pt in the floor with bp 60/40 and cbg 49.  EMS gave 1 amp D50 and started fluid bolus.   CBG increased to 146  Pta.   Pt alert and oriented.  BP increased to 93/41.  Pt c/o pain r arm, back, and shoulder.  Reports is supposed to get injections in her shoulder.  Pt also says had chest pain when she "came to."   CBG in ED was 76.

## 2017-11-24 NOTE — H&P (Signed)
History and Physical    AMMA CREAR UXN:[id number] DOB: 1951-05-12 DOA: 11/24/2017  Referring MD/NP/PA: Dr. Alvino Chapel PCP: [name], MD  Patient coming from: Home  Chief Complaint: Syncope, hypoglycemia.  HPI: Stephanie Combs is a 67 y.o. female with extended past medical history including coronary artery disease, chronic back pain, COPD (with chronic respiratory failure wearing 2 L nasal cannula oxygen supplementation at baseline), anxiety, insulin-dependent diabetes mellitus, chronic kidney disease is stage III and hypothyroidism; presented to the emergency department with recurrent episodes of syncope.  This is not the first time that she has been admitted for the same problem.  Patient reports that she woke up this morning have some breakfast felt her cat and fell to go back to bed waking later on on the floor at bedside.  Patient reports no hitting her head but was complaining of chest pain.  The pain was on her right side radiated to the left and to her shoulders, no associated diaphoresis, nausea or vomiting.  Patient contacted EMS when this happened and on their arrival she was found to be with a blood pressure in the 25K systolic and CBG in the 27C.  Patient received an amp of D50 and was transported to the ED for further evaluation and treatment. Patient reported no feeling good the night before this event, but expressed that she continue eating, drinking and taking her medications as prescribed.  She denies any fever, chills, dysuria, hematuria, headaches, focal weaknesses, coughing, worsening of her breathing, melena, hematochezia or hematemesis.  In the ED EKG demonstrated no acute ischemic changes, troponin x1 neg, chest x-ray without acute cardiopulmonary process, blood work essentially unremarkable for her with a demonstrated improved TSH.  Her CBG was low and despite feeding her and receiving the amp of D50 her CBG dropped to 42.  TRH was called to place patient in observation for  further evaluation and treatment of recurrent syncope and hypoglycemia.  Past Medical/Surgical History: Past Medical History:  Diagnosis Date  . Anxiety   . CAD (coronary artery disease)    a. s/p DES to LAD and angioplasty to D1 in 05/2017  . CHF (congestive heart failure) (HCC)    Diastolic  . Chronic back pain   . COPD (chronic obstructive pulmonary disease) ([place])   . Degenerative disk disease   . Diabetes mellitus without complication ([place])   . Hypertension   . Hypothyroidism   . On home O2    2.5 L N/C prn  . Pedal edema     Past Surgical History:  Procedure Laterality Date  . CORONARY STENT INTERVENTION N/A 05/22/2017   Procedure: CORONARY STENT INTERVENTION;  Surgeon: Martinique, Peter M, MD;  Location: [facility];  Service: Cardiovascular;  Laterality: N/A;  . HERNIA REPAIR    . RIGHT/LEFT HEART CATH AND CORONARY ANGIOGRAPHY N/A 05/19/2017   Procedure: RIGHT/LEFT HEART CATH AND CORONARY ANGIOGRAPHY;  Surgeon: Leonie Man, MD;  Location: [facility];  Service: Cardiovascular;  Laterality: N/A;  . TUBAL LIGATION      Social History:  reports that she quit smoking about 3 years ago. Her smoking use included cigarettes. She smoked 0.25 packs per day. She has never used smokeless tobacco. She reports that she does not drink alcohol or use drugs.  Allergies: Allergies  Allergen Reactions  . Dilaudid [Hydromorphone Hcl] Itching  . Actifed Cold-Allergy [Chlorpheniramine-Phenylephrine]     "I was sick and red and it didn't agree with me at all"  .  Doxycycline Nausea And Vomiting  . Other Cough    Pt states she is allergic to ragweed and that she starts coughing and sneezing like crazy  . Penicillins Hives and Itching    Tolerates Rocephin Has patient had a PCN reaction causing immediate rash, facial/tongue/throat swelling, SOB or lightheadedness with hypotension: Yes Has patient had a PCN reaction causing severe rash involving mucus membranes or skin  necrosis: No Has patient had a PCN reaction that required hospitalization Yes Has patient had a PCN reaction occurring within the last 10 years: No If all of the above answers are "NO", then may proceed with Cephalosporin use.   . Reglan [Metoclopramide] Itching  . Valium [Diazepam] Itching  . Vistaril [Hydroxyzine Hcl] Itching    Family History:  Family History  Problem Relation Age of Onset  . Stroke Mother   . Heart attack Father   . Diabetes Brother   . Cerebral palsy Brother   . Pneumonia Brother   . Diabetes Other   . Heart attack Other     Prior to Admission medications   Medication Sig Start Date End Date Taking? Authorizing Provider  albuterol (PROAIR HFA) 108 (90 BASE) MCG/ACT inhaler Inhale 1 puff into the lungs every 4 (four) hours as needed. For shortness of breath Patient taking differently: Inhale 2 puffs into the lungs every 4 (four) hours as needed. For shortness of breath 05/30/13  Yes Buriev, Arie Sabina, MD  albuterol (PROVENTIL) (2.5 MG/3ML) 0.083% nebulizer solution Take 2.5 mg by nebulization 2 (two) times daily as needed for wheezing or shortness of breath.    Yes [provider]  ALPRAZolam Duanne Moron) 1 MG tablet Take 1 mg by mouth 3 (three) times daily as needed for anxiety or sleep.  01/05/17  Yes [provider]  aspirin EC 81 MG tablet Take 81 mg by mouth daily.   Yes [provider]  atorvastatin (LIPITOR) 80 MG tablet Take 1 tablet (80 mg total) by mouth daily at 6 PM. Patient taking differently: Take 80 mg by mouth every morning.  05/26/17  Yes [name], MD  Cholecalciferol (VITAMIN D3) 5000 units CAPS Take 1 capsule (5,000 Units total) by mouth daily. 08/19/16  Yes Cassandria Anger, MD  clopidogrel (PLAVIX) 75 MG tablet Take 1 tablet (75 mg total) by mouth daily. 05/26/17  Yes [name], MD  docusate sodium (COLACE) 100 MG capsule Take 300 mg by mouth daily.   Yes [provider]  ferrous sulfate 325 (65 FE) MG  tablet Take 1 tablet (325 mg total) by mouth daily with breakfast. 02/05/17  Yes [name], MD  furosemide (LASIX) 20 MG tablet Take 20 mg by mouth 2 (two) times daily.   Yes [provider]  gabapentin (NEURONTIN) 300 MG capsule Take 1 capsule (300 mg total) by mouth 3 (three) times daily. 02/05/17  Yes [name], MD  Hydrocortisone (GERHARDT'S BUTT CREAM) CREA Apply 1 application topically 3 (three) times daily. 05/26/17  Yes [name], MD  insulin aspart (NOVOLOG) 100 UNIT/ML injection Inject 0-9 Units into the skin 3 (three) times daily with meals. Patient taking differently: Inject 1-18 Units into the skin 3 (three) times daily with meals. Per sliding scale 05/26/17  Yes [name], MD  insulin detemir (LEVEMIR) 100 UNIT/ML injection Inject 0.52 mLs (52 Units total) into the skin at bedtime. 05/26/17  Yes [name], MD  levothyroxine (SYNTHROID, LEVOTHROID) 150 MCG tablet Take 1 tablet (150 mcg total) by mouth daily at 6 (six)  AM. 10/26/17  Yes Isaac Bliss, Rayford Halsted, MD  nystatin (MYCOSTATIN/NYSTOP) powder Apply topically 2 (two) times daily. 10/10/16  Yes Eber Jones, MD  Omega-3 Fatty Acids (FISH OIL PO) Take 1 capsule by mouth daily.    Yes [provider]  ondansetron (ZOFRAN) 4 MG tablet Take 4 mg by mouth every 8 (eight) hours as needed for vomiting.  01/01/17  Yes [provider]  oxyCODONE-acetaminophen (PERCOCET) 10-325 MG tablet Take 1 tablet by mouth every 6 (six) hours as needed for pain.   Yes [provider]  OXYGEN Inhale 2 L into the lungs continuous.   Yes [provider]  pantoprazole (PROTONIX) 40 MG tablet Take 1 tablet (40 mg total) by mouth daily. 02/05/17  Yes [name], MD  polyethylene glycol [facility] / Floria Raveling) packet Take 17 g by mouth daily as needed for mild constipation or moderate constipation. 10/16/17  Yes Johnson, Clanford L, MD  potassium chloride (MICRO-K) 10 MEQ CR capsule Take 10 mEq by mouth 2 (two) times  daily.   Yes [provider]  tiZANidine (ZANAFLEX) 4 MG tablet Take 8 mg by mouth 2 (two) times daily.  01/10/17  Yes [provider]  topiramate (TOPAMAX) 25 MG tablet Take 75 mg by mouth daily.  01/02/17  Yes [provider]  umeclidinium-vilanterol (ANORO ELLIPTA) 62.5-25 MCG/INH AEPB Inhale 1 puff into the lungs daily. 05/26/17  Yes [name], MD  VICTOZA 18 MG/3ML SOPN Inject 1.2 mg into the skin daily.  01/01/17  Yes [provider]  SURE COMFORT INS SYR 1CC/28G 28G X 1/2" 1 ML MISC USE TO INJECT INSULIN UP TO 4 TIMES DAILY. 03/06/17   Cassandria Anger, MD    Review of Systems:  10 system reviewed and negative except as otherwise mentioned in HPI.   Physical Exam: Vitals:   11/24/17 1515 11/24/17 1530 11/24/17 1610 11/24/17 1611  BP:    (!) 128/52  Pulse: 70 70  69  Resp: 16 12  20   Temp:    97.9 F (36.6 C)  TempSrc:    Oral  SpO2: 96% 100%  100%  Weight:   (!) 137 kg (302 lb 2 oz)   Height:   5\' 6"  (1.676 m)     Constitutional: NAD, calm, comfortable; oriented x3 and complaining of lower back pain.  Reports feeling some burning sensation in her chest. Eyes: PERRL, lids and conjunctivae normal, no icterus, no nystagmus ENMT: Mucous membranes are moist. Posterior pharynx clear of any exudate or lesions. Normal dentition.  Neck: normal, supple, no masses, no thyromegaly, no JVD Respiratory: Good air movement bilaterally, no wheezing, no crackles appreciated. Decreased breath sounds at the bases, positive for scattered rhonchi; no using accessory muscles.  Patient with nasal cannula oxygen supplementation 2 L (unchanged from baseline). Cardiovascular: Regular rate and rhythm, no murmurs / rubs / gallops.  Unable to fully assess JVD given body habitus. Abdomen: Obese, no guarding, no tenderness, no masses palpated. No hepatosplenomegaly. Bowel sounds positive.  Musculoskeletal: no clubbing / cyanosis. No contractures. Normal muscle tone.   Patient with 1-2+ edema bilaterally extending all the way to her knees. Skin: no significant rashes, lesions, open ulcers or induration appreciated on exam. Neurologic: CN 2-12 grossly intact. Sensation intact, DTR normal. Strength 4/5 in all 4 limbs in the setting of poor effort and deconditioning.Marland Kitchen  Psychiatric: Normal judgment and insight. Alert and oriented x 3.  Slightly anxious, no hallucinations, no agitation.   Labs on Admission: I  have personally reviewed the following labs and imaging studies  CBC: Recent Labs  Lab 11/24/17 1218  WBC 8.1  NEUTROABS 4.8  HGB 9.0*  HCT 29.6*  MCV 91.6  PLT 573   Basic Metabolic Panel: Recent Labs  Lab 11/24/17 1218  NA 142  K 3.7  CL 108  CO2 26  GLUCOSE 70  BUN 30*  CREATININE 1.37*  CALCIUM 8.1*   GFR: Estimated Creatinine Clearance: 56.9 mL/min (A) (by C-G formula based on SCr of 1.37 mg/dL (H)).    Liver Function Tests: Recent Labs  Lab 11/24/17 1218  AST 17  ALT 10  ALKPHOS 84  BILITOT 0.7  PROT 6.3*  ALBUMIN 3.3*   Cardiac Enzymes: Recent Labs  Lab 11/24/17 1218 11/24/17 1733  TROPONINI <0.03 <0.03   CBG: Recent Labs  Lab 11/24/17 1158 11/24/17 1403 11/24/17 1443 11/24/17 1614  GLUCAP 76 42* 130* 181*   Thyroid Function Tests: Recent Labs    11/24/17 1218  TSH 34.012*   Urine analysis:    Component Value Date/Time   COLORURINE AMBER (A) 10/23/2017 2301   APPEARANCEUR HAZY (A) 10/23/2017 2301   LABSPEC 1.020 10/23/2017 2301   PHURINE 5.0 10/23/2017 2301   GLUCOSEU NEGATIVE 10/23/2017 2301   HGBUR NEGATIVE 10/23/2017 2301   BILIRUBINUR SMALL (A) 10/23/2017 2301   KETONESUR 5 (A) 10/23/2017 2301   PROTEINUR NEGATIVE 10/23/2017 2301   UROBILINOGEN 0.2 12/04/2014 0650   NITRITE NEGATIVE 10/23/2017 2301   LEUKOCYTESUR NEGATIVE 10/23/2017 2301   Radiological Exams on Admission: Dg Chest Portable 1 View  Result Date: 11/24/2017 CLINICAL DATA:  Acute onset chest pain. Patient fell out of bed  earlier today. EXAM: PORTABLE CHEST 1 VIEW COMPARISON:  10/15/2017, 10/13/2017, 09/02/2017 and earlier. FINDINGS: Suboptimal inspiration accounts for crowded bronchovascular markings, especially in the bases, and accentuates the cardiac silhouette. Taking this into account, cardiac silhouette normal in size for AP portable technique. Lungs clear. Bronchovascular markings normal. Pulmonary vascularity normal. No visible pleural effusions. No pneumothorax. IMPRESSION: Suboptimal inspiration.  No acute cardiopulmonary disease. Electronically Signed   By: Evangeline Dakin M.D.   On: 11/24/2017 12:41    EKG: Independently reviewed.  Normal sinus rhythm, nonsustained PACs; no acute ischemic changes appreciated on exam.  Assessment/Plan 1-syncope: Appears to be associated with hypotension and hypoglycemia. -When EMS arrived to her house blood pressure was in the 80s and the patient CBG was in the mid 40s -She received an amp of D50 and was transported to the ED for further evaluation and treatment. -In the ED fluid resuscitation was given with improvement in her blood pressure, troponin will check being negative x1 no acute abnormalities seen on her EKG except for scattered PACs. -Work-up failed to demonstrate any acute source of infection or any other abnormalities that would explain her syncope. -Patient blood sugar continued to be low and the patient was admitted for observation in order to stabilize her CBGs, blood pressure and further evaluation for her syncope. -Will check cortisol level -Hold Victoza and long-acting insulin at this time -Sliding scale without bedtime coverage will be provided. -PT in am  2-chronic resp failure due to COPD (chronic obstructive pulmonary disease) (HCC) -breathing stable -no wheezing -will continue oxygen supplementation and home inhaler/nebulizer regimen   3-HTN (hypertension), benign -will hold diuretics for now -IVF's given in ED -will follow VS and check  orthostatic VS in am  4-CKD (chronic kidney disease), stage III ([place]) -appears to be at baseline -follow Cr trend   5-Hypothyroidism -improved  since last medication adjustment -TSH is 32.0 now -continue synthroid at 175 mcg daily  6-Uncontrolled type 2 diabetes mellitus with stage 3 chronic kidney disease ([place]): with hypoglycemic event -hold victoza and Levemir -continue novolog SSI -last A1C 8.1  7-Obesity, Class III, BMI 40-49.9 (morbid obesity) (HCC) -low calorie diet and increase physical activity discussed with patient  -Body mass index is 48.76 kg/m.  8-Anxiety -continue PRN xanax (hald dose only  9-chronic pain syndrome/CP -will continue PRN analgesics -Including chest discomfort -Will cycle troponin, especially in the setting of syncope event. -No acute ischemic changes on EKG or telemetry so far. -Continue statins, aspirin and Plavix.  10-hyperlipidemia -Continue statins.  11-GERD -continue PPI  DVT prophylaxis: Heparin Code Status: Full code Family Communication: No family at bedside Disposition Plan: To be determined.  Physical therapy to assess in the morning; patient has declined multiple times admissions at skilled nursing facility but continue to fail taking care of herself at home. Consults called: Cardiology was consulted by ED physician. Admission status: LOS less than 2 midnights; telemetry bed, observation.   Time Spent: 70 minutes  Barton Dubois MD [facility] Pager [phone]  If 7PM-7AM, please contact night-coverage [email or web address] Password [email or web address]  11/24/2017, 6:36 PM

## 2017-11-24 NOTE — ED Provider Notes (Signed)
[facility] EMERGENCY DEPARTMENT Provider Note   CSN: [id number] Arrival date & time: 11/24/17  1155     History   Chief Complaint Chief Complaint  Patient presents with  . Fall    HPI Stephanie Combs is a 67 y.o. female.  HPI Patient presents with syncopal episode.  States she was at home this morning.  States that she got up took her medicines and states she was feeling sleepy.  States she checked her sugar and it was 120.  States she later was on the floor by her bed.  States she fell out of bed.  States she has had chest pain all morning but worse after the fall.  Also some pain in left shoulder.  She has had syncopal episodes before.  Thought to be due to bradycardia and hypothyroidism.  Had her thyroid medication increased on Friday 275 mcg.  Dr. Maudie Mercury reportedly said it is going to go up to 200 soon.  Had briefly been on metoprolol but she has been off that now for a while.  History of CHF.  States she is caring a lot of fluid on her abdomen.  States that she is 40 pounds up.  When EMS came and found her to be hypotensive with sugars in the 40s also.  Given D50 and fluid.  Some improvement with that.  States she did eat breakfast this morning.  States she ate extra fluid thinking that she was going to nap all day because she was not feeling good.  Patient states she is in atrial fibrillation.  States she has a history of this but I cannot find that in her chart.  States that her home health nurse told her that she could feel A. fib.  EMS reportedly told her she was in A. fib also. Past Medical History:  Diagnosis Date  . Anxiety   . CAD (coronary artery disease)    a. s/p DES to LAD and angioplasty to D1 in 05/2017  . CHF (congestive heart failure) (HCC)    Diastolic  . Chronic back pain   . COPD (chronic obstructive pulmonary disease) ([place])   . Degenerative disk disease   . Diabetes mellitus without complication ([place])   . Hypertension   . Hypothyroidism   . On home O2    2.5  L N/C prn  . Pedal edema     Patient Active Problem List   Diagnosis Date Noted  . Syncope 10/24/2017  . Bradycardia   . Syncope due to orthostatic hypotension 09/04/2017  . Obesity, Class III, BMI 40-49.9 (morbid obesity) ([place]) 09/04/2017  . Abnormal nuclear stress test 05/19/2017  . Atypical angina ([place]) 05/19/2017  . Edema 05/15/2017  . Skin ulcer ([place]) 01/30/2017  . Tinea cruris 10/10/2016  . Vitamin D deficiency 08/19/2016  . Personal history of noncompliance with medical treatment, presenting hazards to health 06/17/2016  . CAP (community acquired pneumonia) 07/25/2015  . Pressure ulcer 05/02/2015  . Hyponatremia 05/01/2015  . Acute-on-chronic kidney injury ([place]) 05/01/2015  . Uncontrolled type 2 diabetes mellitus with stage 3 chronic kidney disease ([place]) 05/01/2015  . Cellulitis of foot, right 03/24/2015  . Peripheral edema 12/04/2014  . Acute renal failure superimposed on stage 3 chronic kidney disease ([place]) 11/24/2014  . Bilateral edema of lower extremity   . Diabetic ulcer of right great toe ([place])   . Foot ulcer due to secondary DM ([place]) 11/07/2014  . Diabetic ulcer of right foot ([place]) 11/07/2014  . Edema of lower  extremity 05/27/2013  . CKD (chronic kidney disease), stage III ([place]) 04/14/2013  . Anemia in CKD (chronic kidney disease) 04/14/2013  . Chest pain 04/14/2013  . Dyspnea 04/14/2013  . Chronic diastolic CHF (congestive heart failure) ([place])   . Hypothyroidism   . COPD (chronic obstructive pulmonary disease) ([place]) 08/07/2012  . Morbid obesity ([place]) 08/07/2012  . DM type 2 (diabetes mellitus, type 2) ([place]) 08/07/2012  . HTN (hypertension), benign 08/07/2012    Past Surgical History:  Procedure Laterality Date  . CORONARY STENT INTERVENTION N/A 05/22/2017   Procedure: CORONARY STENT INTERVENTION;  Surgeon: Martinique, Peter M, MD;  Location: [place] CV LAB;  Service: Cardiovascular;  Laterality: N/A;  . HERNIA REPAIR    . RIGHT/LEFT HEART CATH AND CORONARY  ANGIOGRAPHY N/A 05/19/2017   Procedure: RIGHT/LEFT HEART CATH AND CORONARY ANGIOGRAPHY;  Surgeon: Leonie Man, MD;  Location: [place] CV LAB;  Service: Cardiovascular;  Laterality: N/A;  . TUBAL LIGATION       OB History   None      Home Medications    Prior to Admission medications   Medication Sig Start Date End Date Taking? Authorizing Provider  albuterol (PROAIR HFA) 108 (90 BASE) MCG/ACT inhaler Inhale 1 puff into the lungs every 4 (four) hours as needed. For shortness of breath Patient taking differently: Inhale 2 puffs into the lungs every 4 (four) hours as needed. For shortness of breath 05/30/13  Yes Buriev, Arie Sabina, MD  albuterol (PROVENTIL) (2.5 MG/3ML) 0.083% nebulizer solution Take 2.5 mg by nebulization 2 (two) times daily as needed for wheezing or shortness of breath.    Yes [provider]  ALPRAZolam Duanne Moron) 1 MG tablet Take 1 mg by mouth 3 (three) times daily as needed for anxiety or sleep.  01/05/17  Yes [provider]  aspirin EC 81 MG tablet Take 81 mg by mouth daily.   Yes [provider]  atorvastatin (LIPITOR) 80 MG tablet Take 1 tablet (80 mg total) by mouth daily at 6 PM. Patient taking differently: Take 80 mg by mouth every morning.  05/26/17  Yes [name], MD  Cholecalciferol (VITAMIN D3) 5000 units CAPS Take 1 capsule (5,000 Units total) by mouth daily. 08/19/16  Yes Cassandria Anger, MD  clopidogrel (PLAVIX) 75 MG tablet Take 1 tablet (75 mg total) by mouth daily. 05/26/17  Yes [name], MD  docusate sodium (COLACE) 100 MG capsule Take 300 mg by mouth daily.   Yes [provider]  ferrous sulfate 325 (65 FE) MG tablet Take 1 tablet (325 mg total) by mouth daily with breakfast. 02/05/17  Yes [name], MD  furosemide (LASIX) 20 MG tablet Take 20 mg by mouth 2 (two) times daily.   Yes [provider]  gabapentin (NEURONTIN) 300 MG capsule Take 1 capsule (300 mg total) by mouth 3 (three) times daily.  02/05/17  Yes [name], MD  Hydrocortisone (GERHARDT'S BUTT CREAM) CREA Apply 1 application topically 3 (three) times daily. 05/26/17  Yes [name], MD  insulin aspart (NOVOLOG) 100 UNIT/ML injection Inject 0-9 Units into the skin 3 (three) times daily with meals. Patient taking differently: Inject 1-18 Units into the skin 3 (three) times daily with meals. Per sliding scale 05/26/17  Yes [name], MD  insulin detemir (LEVEMIR) 100 UNIT/ML injection Inject 0.52 mLs (52 Units total) into the skin at bedtime. 05/26/17  Yes [name], MD  levothyroxine (SYNTHROID, LEVOTHROID) 150 MCG tablet Take 1 tablet (150 mcg total) by  mouth daily at 6 (six) AM. 10/26/17  Yes Isaac Bliss, Rayford Halsted, MD  nystatin (MYCOSTATIN/NYSTOP) powder Apply topically 2 (two) times daily. 10/10/16  Yes Eber Jones, MD  Omega-3 Fatty Acids (FISH OIL PO) Take 1 capsule by mouth daily.    Yes [provider]  ondansetron (ZOFRAN) 4 MG tablet Take 4 mg by mouth every 8 (eight) hours as needed for vomiting.  01/01/17  Yes [provider]  oxyCODONE-acetaminophen (PERCOCET) 10-325 MG tablet Take 1 tablet by mouth every 6 (six) hours as needed for pain.   Yes [provider]  OXYGEN Inhale 2 L into the lungs continuous.   Yes [provider]  pantoprazole (PROTONIX) 40 MG tablet Take 1 tablet (40 mg total) by mouth daily. 02/05/17  Yes [name], MD  polyethylene glycol [facility] / Floria Raveling) packet Take 17 g by mouth daily as needed for mild constipation or moderate constipation. 10/16/17  Yes Johnson, Clanford L, MD  potassium chloride (MICRO-K) 10 MEQ CR capsule Take 10 mEq by mouth 2 (two) times daily.   Yes [provider]  tiZANidine (ZANAFLEX) 4 MG tablet Take 8 mg by mouth 2 (two) times daily.  01/10/17  Yes [provider]  topiramate (TOPAMAX) 25 MG tablet Take 75 mg by mouth daily.  01/02/17  Yes [provider]  umeclidinium-vilanterol (ANORO ELLIPTA)  62.5-25 MCG/INH AEPB Inhale 1 puff into the lungs daily. 05/26/17  Yes [name], MD  VICTOZA 18 MG/3ML SOPN Inject 1.2 mg into the skin daily.  01/01/17  Yes [provider]  SURE COMFORT INS SYR 1CC/28G 28G X 1/2" 1 ML MISC USE TO INJECT INSULIN UP TO 4 TIMES DAILY. 03/06/17   Cassandria Anger, MD    Family History Family History  Problem Relation Age of Onset  . Stroke Mother   . Heart attack Father   . Diabetes Brother   . Cerebral palsy Brother   . Pneumonia Brother   . Diabetes Other   . Heart attack Other     Social History Social History   Tobacco Use  . Smoking status: Former Smoker    Packs/day: 0.25    Types: Cigarettes    Last attempt to quit: 11/24/2014    Years since quitting: 3.0  . Smokeless tobacco: Never Used  Substance Use Topics  . Alcohol use: No    Alcohol/week: 0.0 oz  . Drug use: No     Allergies   Dilaudid [hydromorphone hcl]; Actifed cold-allergy [chlorpheniramine-phenylephrine]; Doxycycline; Other; Penicillins; Reglan [metoclopramide]; Valium [diazepam]; and Vistaril [hydroxyzine hcl]   Review of Systems Review of Systems  Constitutional: Negative for diaphoresis.  HENT: Negative for congestion.   Respiratory: Negative for shortness of breath.   Cardiovascular: Positive for chest pain.  Gastrointestinal: Negative for abdominal pain.  Genitourinary: Negative for flank pain.  Musculoskeletal: Positive for back pain.  Skin: Negative for rash and wound.  Neurological: Positive for syncope.  Psychiatric/Behavioral: Negative for confusion.     Physical Exam Updated Vital Signs BP 91/62   Pulse 63   Temp 98.1 F (36.7 C) (Oral)   Resp 19   Ht 5\' 6"  (1.676 m)   Wt 135.7 kg (299 lb 3.2 oz)   SpO2 100%   BMI 48.29 kg/m   Physical Exam  Constitutional: She appears well-developed.  HENT:  Head: Normocephalic.  Eyes: EOM are normal.  Neck: Neck supple.  Cardiovascular: Normal rate.  Pulmonary/Chest: Effort normal. She  has no wheezes. She has no  rales.  Abdominal: Soft. There is no tenderness.  Musculoskeletal: She exhibits edema.  Some edema bilateral lower extremities.  Neurological: She is alert.  Skin: Skin is warm.     ED Treatments / Results  Labs (all labs ordered are listed, but only abnormal results are displayed) Labs Reviewed  CBC WITH DIFFERENTIAL/PLATELET - Abnormal; Notable for the following components:      Result Value   RBC 3.23 (*)    Hemoglobin 9.0 (*)    HCT 29.6 (*)    All other components within normal limits  COMPREHENSIVE METABOLIC PANEL - Abnormal; Notable for the following components:   BUN 30 (*)    Creatinine, Ser 1.37 (*)    Calcium 8.1 (*)    Total Protein 6.3 (*)    Albumin 3.3 (*)    GFR calc non Af Amer 39 (*)    GFR calc Af Amer 45 (*)    All other components within normal limits  TSH - Abnormal; Notable for the following components:   TSH 34.012 (*)    All other components within normal limits  CBG MONITORING, ED - Abnormal; Notable for the following components:   Glucose-Capillary 42 (*)    All other components within normal limits  TROPONIN I  CBG MONITORING, ED    EKG EKG Interpretation  Date/Time:  Tuesday November 24 2017 12:06:10 EDT Ventricular Rate:  87 PR Interval:    QRS Duration: 91 QT Interval:  396 QTC Calculation: 477 R Axis:   82 Text Interpretation:  sinus rhythm Borderline right axis deviation Low voltage, precordial leads Confirmed by Davonna Belling [id number]) on 11/24/2017 12:26:53 PM   EKG Interpretation  Date/Time:  Tuesday November 24 2017 12:48:58 EDT Ventricular Rate:  89 PR Interval:    QRS Duration: 98 QT Interval:  396 QTC Calculation: 482 R Axis:   84 Text Interpretation:  Sinus rhythm Atrial premature complexes Borderline right axis deviation Low voltage, precordial leads Confirmed by Davonna Belling [phone]) on 11/24/2017 2:00:44 PM       Radiology Dg Chest Portable 1 View  Result Date: 11/24/2017 CLINICAL  DATA:  Acute onset chest pain. Patient fell out of bed earlier today. EXAM: PORTABLE CHEST 1 VIEW COMPARISON:  10/15/2017, 10/13/2017, 09/02/2017 and earlier. FINDINGS: Suboptimal inspiration accounts for crowded bronchovascular markings, especially in the bases, and accentuates the cardiac silhouette. Taking this into account, cardiac silhouette normal in size for AP portable technique. Lungs clear. Bronchovascular markings normal. Pulmonary vascularity normal. No visible pleural effusions. No pneumothorax. IMPRESSION: Suboptimal inspiration.  No acute cardiopulmonary disease. Electronically Signed   By: Evangeline Dakin M.D.   On: 11/24/2017 12:41    Procedures Procedures (including critical care time)  Medications Ordered in ED Medications  dextrose 50 % solution 50 mL (has no administration in time range)  sodium chloride 0.9 % bolus 500 mL (0 mLs Intravenous Stopped 11/24/17 1405)     Initial Impression / Assessment and Plan / ED Course  I have reviewed the triage vital signs and the nursing notes.  Pertinent labs & imaging results that were available during my care of the patient were reviewed by me and considered in my medical decision making (see chart for details).     Patient with syncope.  Has had several episodes of the same recently.  Had been thought to be related to combination of bradycardia and hypo-thyroidism.  TSH is improving and actually had increase dose of her Synthroid 4 days ago.  However still may be clinically  hypothyroid.  EKG shows a sinus rhythm with frequent PACs.  Not atrial fibrillation.  I can find no evidence of previous atrial fibrillation.  Patient did have reported chest pain however troponin is reassuring.  Patient has had recurrent hypoglycemia with sugars going back down to the 40s again.  Blood pressures improved somewhat with some IV fluids.  However with the recurrent hypoglycemia and recurrent syncope but I feel patient would benefit from admission.   Her weight is actually up around 5 pounds from most recent measurement.  Final Clinical Impressions(s) / ED Diagnoses   Final diagnoses:  Syncope, unspecified syncope type  Hypoglycemia    ED Discharge Orders    None       Davonna Belling, MD 11/24/17 1415

## 2017-11-25 DIAGNOSIS — Z880 Allergy status to penicillin: Secondary | ICD-10-CM | POA: Diagnosis not present

## 2017-11-25 DIAGNOSIS — E1122 Type 2 diabetes mellitus with diabetic chronic kidney disease: Secondary | ICD-10-CM

## 2017-11-25 DIAGNOSIS — Z6841 Body Mass Index (BMI) 40.0 and over, adult: Secondary | ICD-10-CM | POA: Diagnosis not present

## 2017-11-25 DIAGNOSIS — Z79899 Other long term (current) drug therapy: Secondary | ICD-10-CM | POA: Diagnosis not present

## 2017-11-25 DIAGNOSIS — Z7951 Long term (current) use of inhaled steroids: Secondary | ICD-10-CM | POA: Diagnosis not present

## 2017-11-25 DIAGNOSIS — I130 Hypertensive heart and chronic kidney disease with heart failure and stage 1 through stage 4 chronic kidney disease, or unspecified chronic kidney disease: Secondary | ICD-10-CM | POA: Diagnosis present

## 2017-11-25 DIAGNOSIS — E11649 Type 2 diabetes mellitus with hypoglycemia without coma: Secondary | ICD-10-CM

## 2017-11-25 DIAGNOSIS — Z955 Presence of coronary angioplasty implant and graft: Secondary | ICD-10-CM | POA: Diagnosis not present

## 2017-11-25 DIAGNOSIS — R55 Syncope and collapse: Principal | ICD-10-CM

## 2017-11-25 DIAGNOSIS — G894 Chronic pain syndrome: Secondary | ICD-10-CM | POA: Diagnosis present

## 2017-11-25 DIAGNOSIS — E038 Other specified hypothyroidism: Secondary | ICD-10-CM

## 2017-11-25 DIAGNOSIS — F419 Anxiety disorder, unspecified: Secondary | ICD-10-CM | POA: Diagnosis present

## 2017-11-25 DIAGNOSIS — N183 Chronic kidney disease, stage 3 (moderate): Secondary | ICD-10-CM | POA: Diagnosis present

## 2017-11-25 DIAGNOSIS — J9611 Chronic respiratory failure with hypoxia: Secondary | ICD-10-CM

## 2017-11-25 DIAGNOSIS — Z794 Long term (current) use of insulin: Secondary | ICD-10-CM | POA: Diagnosis not present

## 2017-11-25 DIAGNOSIS — E1165 Type 2 diabetes mellitus with hyperglycemia: Secondary | ICD-10-CM | POA: Diagnosis not present

## 2017-11-25 DIAGNOSIS — J449 Chronic obstructive pulmonary disease, unspecified: Secondary | ICD-10-CM | POA: Diagnosis present

## 2017-11-25 DIAGNOSIS — Z9981 Dependence on supplemental oxygen: Secondary | ICD-10-CM | POA: Diagnosis not present

## 2017-11-25 DIAGNOSIS — Z881 Allergy status to other antibiotic agents status: Secondary | ICD-10-CM | POA: Diagnosis not present

## 2017-11-25 DIAGNOSIS — E162 Hypoglycemia, unspecified: Secondary | ICD-10-CM

## 2017-11-25 DIAGNOSIS — Z885 Allergy status to narcotic agent status: Secondary | ICD-10-CM | POA: Diagnosis not present

## 2017-11-25 DIAGNOSIS — E039 Hypothyroidism, unspecified: Secondary | ICD-10-CM | POA: Diagnosis present

## 2017-11-25 DIAGNOSIS — I5032 Chronic diastolic (congestive) heart failure: Secondary | ICD-10-CM | POA: Diagnosis present

## 2017-11-25 DIAGNOSIS — Z888 Allergy status to other drugs, medicaments and biological substances status: Secondary | ICD-10-CM | POA: Diagnosis not present

## 2017-11-25 DIAGNOSIS — Z87891 Personal history of nicotine dependence: Secondary | ICD-10-CM | POA: Diagnosis not present

## 2017-11-25 DIAGNOSIS — I2510 Atherosclerotic heart disease of native coronary artery without angina pectoris: Secondary | ICD-10-CM | POA: Diagnosis present

## 2017-11-25 LAB — BASIC METABOLIC PANEL
Anion gap: 7 (ref 5–15)
BUN: 27 mg/dL — ABNORMAL HIGH (ref 8–23)
CO2: 26 mmol/L (ref 22–32)
Calcium: 8.5 mg/dL — ABNORMAL LOW (ref 8.9–10.3)
Chloride: 108 mmol/L (ref 98–111)
Creatinine, Ser: 1.41 mg/dL — ABNORMAL HIGH (ref 0.44–1.00)
GFR calc Af Amer: 44 mL/min — ABNORMAL LOW (ref >60)
GFR calc non Af Amer: 38 mL/min — ABNORMAL LOW (ref >60)
Glucose, Bld: 207 mg/dL — ABNORMAL HIGH (ref 70–99)
Potassium: 4.2 mmol/L (ref 3.5–5.1)
Sodium: 141 mmol/L (ref 135–145)

## 2017-11-25 LAB — TROPONIN I: Troponin I: <0.03 ng/mL (ref <0.03)

## 2017-11-25 LAB — MRSA PCR SCREENING: MRSA by PCR: POSITIVE — AB

## 2017-11-25 LAB — GLUCOSE, CAPILLARY
Glucose-Capillary: 174 mg/dL — ABNORMAL HIGH (ref 70–99)
Glucose-Capillary: 194 mg/dL — ABNORMAL HIGH (ref 70–99)
Glucose-Capillary: 247 mg/dL — ABNORMAL HIGH (ref 70–99)
Glucose-Capillary: 234 mg/dL — ABNORMAL HIGH (ref 70–99)

## 2017-11-25 LAB — CBC
HCT: 30.2 % — ABNORMAL LOW (ref 36.0–46.0)
Hemoglobin: 9.1 g/dL — ABNORMAL LOW (ref 12.0–15.0)
MCH: 27.8 pg (ref 26.0–34.0)
MCHC: 30.1 g/dL (ref 30.0–36.0)
MCV: 92.4 fL (ref 78.0–100.0)
Platelets: 300 10*3/uL (ref 150–400)
RBC: 3.27 MIL/uL — ABNORMAL LOW (ref 3.87–5.11)
RDW: 15.1 % (ref 11.5–15.5)
WBC: 6.9 10*3/uL (ref 4.0–10.5)

## 2017-11-25 MED ORDER — MUPIROCIN 2 % EX OINT
1.0000 "application " | TOPICAL_OINTMENT | Freq: Two times a day (BID) | CUTANEOUS | Status: DC
  Administered 2017-11-25 – 2017-11-26 (×3): 1 via NASAL
  Filled 2017-11-25: qty 22

## 2017-11-25 MED ORDER — ORAL CARE MOUTH RINSE
15.0000 mL | Freq: Two times a day (BID) | OROMUCOSAL | Status: DC
  Administered 2017-11-25 – 2017-11-26 (×3): 15 mL via OROMUCOSAL

## 2017-11-25 MED ORDER — CHLORHEXIDINE GLUCONATE CLOTH 2 % EX PADS
6.0000 | MEDICATED_PAD | Freq: Every day | CUTANEOUS | Status: DC
  Administered 2017-11-25 – 2017-11-26 (×2): 6 via TOPICAL

## 2017-11-25 NOTE — Evaluation (Signed)
Physical Therapy Evaluation Patient Details Name: Stephanie Combs MRN: [id number] DOB: 07-28-1950 Today's Date: 11/25/2017   History of Present Illness  CATLYNN GRONDAHL is a 67 y.o. female with extended past medical history including coronary artery disease, chronic back pain, COPD (with chronic respiratory failure wearing 2 L nasal cannula oxygen supplementation at baseline), anxiety, insulin-dependent diabetes mellitus, chronic kidney disease is stage III and hypothyroidism; presented to the emergency department with recurrent episodes of syncope.  This is not the first time that she has been admitted for the same problem.  Patient reports that she woke up this morning have some breakfast felt her cat and fell to go back to bed waking later on on the floor at bedside.  Patient reports no hitting her head but was complaining of chest pain.  The pain was on her right side radiated to the left and to her shoulders, no associated diaphoresis, nausea or vomiting.  Patient contacted EMS when this happened and on their arrival she was found to be with a blood pressure in the 60F systolic and CBG in the 09N.  Patient received an amp of D50 and was transported to the ED for further evaluation and treatment.    Clinical Impression  Patient limited for functional mobility as stated below secondary to BLE weakness, fatigue and poor standing balance.  Patient will benefit from continued physical therapy in hospital and recommended venue below to increase strength, balance, endurance for safe ADLs and gait.     Follow Up Recommendations Home health PT;Supervision - Intermittent    Equipment Recommendations  None recommended by PT    Recommendations for Other Services       Precautions / Restrictions Precautions Precautions: Fall Restrictions Weight Bearing Restrictions: No      Mobility  Bed Mobility Overal bed mobility: Needs Assistance Bed Mobility: Supine to Sit;Sit to Supine     Supine to  sit: Supervision Sit to supine: Min guard   General bed mobility comments: requires assistance to move RLE back onto bed during sit to supine  Transfers Overall transfer level: Needs assistance Equipment used: Rolling walker (2 wheeled);None Transfers: Sit to/from [employer] to Stand: Supervision Stand pivot transfers: Supervision       General transfer comment: Min guard assist with hand held assist, Supervised using RW  Ambulation/Gait Ambulation/Gait assistance: Supervision Gait Distance (Feet): 50 Feet Assistive device: Rolling walker (2 wheeled) Gait Pattern/deviations: Decreased step length - right;Decreased step length - left;Decreased stride length Gait velocity: decreased   General Gait Details: slightly labored cadence without loss of balance, limite secondary to c/o fatigue  Stairs            Wheelchair Mobility    Modified Rankin (Stroke Patients Only)       Balance Overall balance assessment: Needs assistance Sitting-balance support: Feet supported;No upper extremity supported Sitting balance-Leahy Scale: Good     Standing balance support: During functional activity;No upper extremity supported Standing balance-Leahy Scale: Poor Standing balance comment: fair/poor without AD, fair with RW                             Pertinent Vitals/Pain Pain Assessment: 0-10 Pain Score: 7  Pain Location: low back, head ache, knees, right shoulder Pain Descriptors / Indicators: Aching;Sore Pain Intervention(s): Limited activity within patient's tolerance;Monitored during session    [place] expects to be discharged to:: Private residence Living Arrangements: Alone Available Help at Discharge:  Family;Available 24 hours/day Type of Home: Mobile home Home Access: Ramped entrance     Home Layout: One level Home Equipment: [place] - 2 wheels;Cane - single point;Shower seat;Bedside commode;Wheelchair - manual       Prior Function Level of Independence: Independent with assistive device(s)         Comments: household gait with RW PRN, uses RW for community distances     Hand Dominance   Dominant Hand: Right    Extremity/Trunk Assessment   Upper Extremity Assessment Upper Extremity Assessment: Overall WFL for tasks assessed    Lower Extremity Assessment Lower Extremity Assessment: Generalized weakness    Cervical / Trunk Assessment Cervical / Trunk Assessment: Normal  Communication   Communication: No difficulties  Cognition Arousal/Alertness: Awake/alert Behavior During Therapy: WFL for tasks assessed/performed Overall Cognitive Status: Within Functional Limits for tasks assessed                                        General Comments      Exercises     Assessment/Plan    PT Assessment Patient needs continued PT services  PT Problem List Decreased strength;Decreased activity tolerance;Decreased balance;Decreased mobility       PT Treatment Interventions Gait training;Functional mobility training;Therapeutic activities;Therapeutic exercise;Patient/family education;Stair training    PT Goals (Current goals can be found in the Care Plan section)  Acute Rehab PT Goals Patient Stated Goal: return home PT Goal Formulation: With patient Time For Goal Achievement: 11/28/17 Potential to Achieve Goals: Good    Frequency Min 3X/week   Barriers to discharge        Co-evaluation               AM-PAC PT "6 Clicks" Daily Activity  Outcome Measure Difficulty turning over in bed (including adjusting bedclothes, sheets and blankets)?: None Difficulty moving from lying on back to sitting on the side of the bed? : None Difficulty sitting down on and standing up from a chair with arms (e.g., wheelchair, bedside commode, etc,.)?: None Help needed moving to and from a bed to chair (including a wheelchair)?: A Little Help needed walking in hospital room?: A  Little Help needed climbing 3-5 steps with a railing? : A Lot 6 Click Score: 20    End of Session   Activity Tolerance: Patient tolerated treatment well;Patient limited by fatigue Patient left: in bed;with call bell/phone within reach Nurse Communication: Mobility status PT Visit Diagnosis: Unsteadiness on feet (R26.81);Other abnormalities of gait and mobility (R26.89);Muscle weakness (generalized) (M62.81)    Time: 9794-8016 PT Time Calculation (min) (ACUTE ONLY): 36 min   Charges:   PT Evaluation $PT Eval Moderate Complexity: 1 Mod PT Treatments $Therapeutic Activity: 23-37 mins   PT G Codes:        12:23 PM, 2017-12-24 Lonell Grandchild, MPT Physical Therapist with [facility] [phone] office [phone] mobile phone

## 2017-11-25 NOTE — Progress Notes (Signed)
PROGRESS NOTE  Stephanie Combs WIO:[id number] DOB: [birth year] DOA: 11/24/2017 PCP: [name], MD  Brief History:  This is a 67 year old female with a history of morbid obesity, COPD with chronic respiratory failure on home O2, chronic back pain, anxiety, IDDM, CKD III, hypothyroidism, chronic diastolic CHF, CAD s/p DES to LAD and angioplasty to D1 in 05/2017, DDD, pedal edema presenting with recurrent syncope.  Patient states that after she took a nap with her cat, the patient was sitting up on the side of her bed.  The next thing she remembers is lying on the floor by her bed.  She denies any prodromal type symptoms.  She denies biting her tongue or urinary or bowel incontinence.  This represents the fourth admission since late April for recurrent syncope.  During her each of her last admissions, she was found to be orthostatic.  Her beta-blocker was subsequently discontinued during her admission on 10/13/2017.  Unfortunately, the patient remains orthostatic.  In addition, it was felt that her profound hypothyroidism may also be contributing.  As result, her Synthroid has been gradually increased by her primary care provider.  The patient was noted to have BP 60/40 and CBG 49 by EMS.  She was given D50.  She had recurrent hypotension documented in the ER down to 67/56 at 1230pm, with recovery BP at 1248 to 105/37      Assessment/Plan: Recurrent Syncope -likely multifactorial including orthostatic hypotension, hypoglycemia and polypharmacy -the patient frequently self-adjusts medications without notifying her physicians which may have also contributed to her hypoglycemia -Appreciate cardiology input -Telemetry without concerning dysrhythmia -10/14/2017 echo EF 65-70%, grade 1 DD, mild MR  Orthostatic hypotension -Suspect this may be in part due to her polypharmacy contributing to her orthostasis with medications including but not not limited to gabapentin, Xanax, Zanaflex -Beta-blocker has  previously been discontinued -May need to tolerate higher baseline blood pressure -Awaiting a.m. cortisol, but would anticipate electrolyte abnormalities including hyperkalemia and hyponatremia  Hypothyroidism -Continue Synthroid -TSH is improving -11/24/2017 TSH 34.01  Hypoglycemia/diabetes mellitus type 2 uncontrolled with hyperglycemia -Presented with CBG 42 -Patient frequently self adjust her own Levemir -Discontinue Levemir -Monitor CBGs with NovoLog sliding scale -10/13/2017 hemoglobin A 1C--8.1  Chest Pain -troponins neg x 4 -defer to cardiology -personally reviewed EKG--no concerning ST-T changes -05/2017 she underwent R/LHC showing 75% ostial LAD, 70% prox LAD1 with 80% side branch ostial D1, 60% prox LAD2 -> felt possibly to need bypass but not a suitable candidate for s   CKD stage III -Baseline creatinine 1.0-1.3 -A.m. BMP  Coronary artery disease -Continue aspirin, Plavix, statin  Chronic respiratory failure with hypoxia -Stable on 2 L at home -Stable presently  COPD -Stable without exacerbation -continue Anoro    Disposition Plan:   Home in 1-2 days when cleared by cardiology Family Communication:   No Family at bedside--Total time spent 35 minutes.  Greater than 50% spent face to face counseling and coordinating care.   Consultants:  cardiology  Code Status:  FULL  DVT Prophylaxis:  [place] Heparin    Procedures: As Listed in Progress Note Above  Antibiotics: None    Subjective: Patient denies fevers, chills, headache, chest pain, dyspnea, nausea, vomiting, diarrhea, abdominal pain, dysuria, hematuria, hematochezia, and melena.   Objective: Vitals:   11/24/17 2108 11/25/17 0500 11/25/17 1100 11/25/17 1427  BP: (!) 175/73 (!) 131/59  (!) 120/47  Pulse: 66 71  67  Resp: 18 16  18  Temp: 98.5 F (36.9 C) 98.6 F (37 C)  98.4 F (36.9 C)  TempSrc: Oral Oral  Oral  SpO2: 93% 100% 98% 100%  Weight:  (!) 136.5 kg (300 lb 14.4 oz)    Height:         Intake/Output Summary (Last 24 hours) at 11/25/2017 1742 Last data filed at 11/25/2017 1422 Gross per 24 hour  Intake 366 ml  Output 3150 ml  Net -2784 ml   Weight change:  Exam:   General:  Pt is alert, follows commands appropriately, not in acute distress  HEENT: No icterus, No thrush, No neck mass, [place]/AT  Cardiovascular: RRR, S1/S2, no rubs, no gallops  Respiratory: Bibasilar rales but no wheezing.  Good air movement.  Abdomen: Soft/+BS, non tender, non distended, no guarding  Extremities: 2 + LE edema, No lymphangitis, No petechiae, No rashes, no synovitis   Data Reviewed: I have personally reviewed following labs and imaging studies Basic Metabolic Panel: Recent Labs  Lab 11/24/17 1218 11/25/17 0502  NA 142 141  K 3.7 4.2  CL 108 108  CO2 26 26  GLUCOSE 70 207*  BUN 30* 27*  CREATININE 1.37* 1.41*  CALCIUM 8.1* 8.5*   Liver Function Tests: Recent Labs  Lab 11/24/17 1218  AST 17  ALT 10  ALKPHOS 84  BILITOT 0.7  PROT 6.3*  ALBUMIN 3.3*   No results for input(s): LIPASE, AMYLASE in the last 168 hours. No results for input(s): AMMONIA in the last 168 hours. Coagulation Profile: No results for input(s): INR, PROTIME in the last 168 hours. CBC: Recent Labs  Lab 11/24/17 1218 11/25/17 0502  WBC 8.1 6.9  NEUTROABS 4.8  --   HGB 9.0* 9.1*  HCT 29.6* 30.2*  MCV 91.6 92.4  PLT 273 300   Cardiac Enzymes: Recent Labs  Lab 11/24/17 1218 11/24/17 1733 11/24/17 2249 11/25/17 0502  TROPONINI <0.03 <0.03 <0.03 <0.03   BNP: Invalid input(s): POCBNP CBG: Recent Labs  Lab 11/24/17 1614 11/24/17 2101 11/25/17 0746 11/25/17 1148 11/25/17 1605  GLUCAP 181* 233* 174* 194* 247*   HbA1C: No results for input(s): HGBA1C in the last 72 hours. Urine analysis:    Component Value Date/Time   COLORURINE AMBER (A) 10/23/2017 2301   APPEARANCEUR HAZY (A) 10/23/2017 2301   LABSPEC 1.020 10/23/2017 2301   PHURINE 5.0 10/23/2017 2301   GLUCOSEU  NEGATIVE 10/23/2017 2301   HGBUR NEGATIVE 10/23/2017 2301   BILIRUBINUR SMALL (A) 10/23/2017 2301   KETONESUR 5 (A) 10/23/2017 2301   PROTEINUR NEGATIVE 10/23/2017 2301   UROBILINOGEN 0.2 12/04/2014 0650   NITRITE NEGATIVE 10/23/2017 2301   LEUKOCYTESUR NEGATIVE 10/23/2017 2301   Sepsis Labs: @LABRCNTIP (procalcitonin:4,lacticidven:4) ) Recent Results (from the past 240 hour(s))  MRSA PCR Screening     Status: Abnormal   Collection Time: 11/24/17  9:51 PM  Result Value Ref Range Status   MRSA by PCR POSITIVE (A) NEGATIVE Final    Comment: RESULT CALLED TO, READ BACK BY AND VERIFIED WITH: LIGHTNER,N @ 0351 ON 7.17.19 BY BOWMAN,L        The GeneXpert MRSA Assay (FDA approved for NASAL specimens only), is one component of a comprehensive MRSA colonization surveillance program. It is not intended to diagnose MRSA infection nor to guide or monitor treatment for MRSA infections. Performed at [facility], [address], [place], [place] [address]      Scheduled Meds: . aspirin EC  81 mg Oral Daily  . atorvastatin  80 mg Oral q1800  .  Chlorhexidine Gluconate Cloth  6 each Topical Q0600  . cholecalciferol  5,000 Units Oral Daily  . clopidogrel  75 mg Oral Daily  . docusate sodium  300 mg Oral Daily  . ferrous sulfate  325 mg Oral Q breakfast  . gabapentin  300 mg Oral TID  . heparin injection (subcutaneous)  5,000 Units Subcutaneous Q8H  . insulin aspart  0-15 Units Subcutaneous TID WC  . levothyroxine  175 mcg Oral Q0600  . mouth rinse  15 mL Mouth Rinse BID  . mupirocin ointment  1 application Nasal BID  . nystatin   Topical BID  . omega-3 acid ethyl esters  1 g Oral Daily  . pantoprazole  40 mg Oral Daily  . potassium chloride  20 mEq Oral Daily  . sodium chloride flush  3 mL Intravenous Q12H  . umeclidinium-vilanterol  1 puff Inhalation Daily   Continuous Infusions: . sodium chloride      Procedures/Studies: Dg Chest Portable 1 View  Result Date:  11/24/2017 CLINICAL DATA:  Acute onset chest pain. Patient fell out of bed earlier today. EXAM: PORTABLE CHEST 1 VIEW COMPARISON:  10/15/2017, 10/13/2017, 09/02/2017 and earlier. FINDINGS: Suboptimal inspiration accounts for crowded bronchovascular markings, especially in the bases, and accentuates the cardiac silhouette. Taking this into account, cardiac silhouette normal in size for AP portable technique. Lungs clear. Bronchovascular markings normal. Pulmonary vascularity normal. No visible pleural effusions. No pneumothorax. IMPRESSION: Suboptimal inspiration.  No acute cardiopulmonary disease. Electronically Signed   By: Evangeline Dakin M.D.   On: 11/24/2017 12:41    Orson Eva, DO  [facility] Pager [phone]  If 7PM-7AM, please contact night-coverage [email or web address] Password [email or web address] 11/25/2017, 5:42 PM   LOS: 0 days

## 2017-11-25 NOTE — Consult Note (Addendum)
Cardiology Consultation:   Patient ID: Stephanie SWEARINGIN; [id number]; 1950-10-28   Admit date: 11/24/2017 Date of Consult: 11/25/2017  Primary Care Provider: [name], MD Primary Cardiologist: Kate Sable, MD  Chief Complaint: passed out again  Patient Profile:   Stephanie Combs is a 67 y.o. female with a hx of morbid obesity, COPD with chronic respiratory failure on home O2, chronic back pain, anxiety, IDDM, CKD III, hypothyroidism, chronic diastolic CHF, CAD s/p DES to LAD and angioplasty to D1 in 05/2017, DDD, pedal edema, anemia who is being seen today for the evaluation of syncope at the request of Dr. Carles Collet.  History of Present Illness:   In 05/2017 she underwent R/LHC showing 75% ostial LAD, 70% prox LAD1 with 80% side [name] ostial D1, 60% prox LAD2 -> felt possibly to need bypass but not a suitable candidate for such, so underwent stenting to LAD and angioplasty to diagonal - findings also notable for mildly elevated LVEDP and mild pulm HTN.   Since that time she's been admitted multiple times with syncope. She was admitted 08/2017 for evaluation of a syncopal episode which was thought to be due to orthostasis as SBP dropped by more than 60 mm Hg at the time of admission. TSH was low so thyroid supplementation was cut way back to minimum dose. An event monitor was placed upon hospital discharge which showed normal sinus rhythm with occasional PAC's and isolated atrial runs with no significant arrhythmias. She was again admitted to [facility] from 10/13/2017 to 10/16/2017 for recurrent syncope. She reported that she had been sitting in a chair and woke up on the floor and was unable to recall how long she had lost consciousness for.  Electrolytes were within normal limits but creatinine was elevated 2.09 on admission and TSH was significantly elevated at 199.45. Synthroid dosing was titrated back upwards. She had been bradycardic with heart rate in the 40's to 50's upon admission, therefore  Lopressor was discontinued. Heart rate had improved into the 60's by the time of discharge and creatinine was at 1.36 on 10/15/2017. 2D echo 10/24/17 showed EF 65-70%, grade 1 DD, mild MR, PASP 34mmHg. She again presented to ER 10/23/17 for syncope - while in the ED blood pressure was found to be at 83/48 and her heart rate increased by 25 bpm with standing. She reported having continued to have taken Metoprolol despite this being discontinued at the time of her most recent admission. This was again discontinued and Lasix was held for 3 days with instructions to resume on 10/29/2017. Imdur was also discontinued.[name]'s last note indicates there was some discord between what the patient recalled being told about her heart (that there was a part that was "dead") and what the actual diagnoses had been.  She was in her USOH day of admission and recalls multiple things to eat (sandwich, Nabs). She decided she was going to go take a nap with her cat. She recalls walking across her home but then after that has amnesia about the event. She woke up on the floor with her water bottle and phone underneath her. Based on how she woke up she assumes she had sat down on the floor. She dialed 911. EMS arrived to find patient on the floor with BP 60/40 and CBG of 49 -> rec'd D50 and fluid bolus. She reported severe substernal chest pain lasting about 5 minutes, improving spontaneously. She had some recurrence last night as well, again resolving spontaneously. She had recurrent hypotension  documented in the ER down to 67/56 at 1230pm, with recovery BP at 1248 to 105/37 as well as recurrent hypoglycemia. HR documented in the 60s at that time but no specific provider/nurse notes regarding any corresponding sx. Peak BP this admission 175/73. EDP  note indicates the patient thought she had history of atrial fib or had been told she was in atrial fib, which was not the case. Tele appears to show NSR with PACs versus wandering  atrial pacemaker. Labs notable for neg trop x 4, Hgb 9.1, Cr 1.37-1.41, TSH 34 (recent adjustment again as OP). CXR NAD. Patient denies current CP but is clearly frustrated at her recurrent events. She also appears to have gained a significant amount of weight since the spring, but this appears highly variable in Epic as well. (269-278 in 10/2017 with gradual uptrend since that time). Volume status extremely challenging given BMI 48.   Past Medical History:  Diagnosis Date  . Anxiety   . CAD (coronary artery disease)    a. s/p DES to LAD and angioplasty to D1 in 05/2017  . CHF (congestive heart failure) (HCC)    Diastolic  . Chronic back pain   . COPD (chronic obstructive pulmonary disease) ([place])   . Degenerative disk disease   . Diabetes mellitus without complication ([place])   . Hypertension   . Hypothyroidism   . On home O2    2.5 L N/C prn  . Pedal edema     Past Surgical History:  Procedure Laterality Date  . CORONARY STENT INTERVENTION N/A 05/22/2017   Procedure: CORONARY STENT INTERVENTION;  Surgeon: Martinique, Peter M, MD;  Location: [facility];  Service: Cardiovascular;  Laterality: N/A;  . HERNIA REPAIR    . RIGHT/LEFT HEART CATH AND CORONARY ANGIOGRAPHY N/A 05/19/2017   Procedure: RIGHT/LEFT HEART CATH AND CORONARY ANGIOGRAPHY;  Surgeon: Leonie Man, MD;  Location: [facility];  Service: Cardiovascular;  Laterality: N/A;  . TUBAL LIGATION       Inpatient Medications: Scheduled Meds: . aspirin EC  81 mg Oral Daily  . atorvastatin  80 mg Oral q1800  . Chlorhexidine Gluconate Cloth  6 each Topical Q0600  . cholecalciferol  5,000 Units Oral Daily  . clopidogrel  75 mg Oral Daily  . docusate sodium  300 mg Oral Daily  . ferrous sulfate  325 mg Oral Q breakfast  . gabapentin  300 mg Oral TID  . heparin injection (subcutaneous)  5,000 Units Subcutaneous Q8H  . insulin aspart  0-15 Units Subcutaneous TID WC  . levothyroxine  175 mcg Oral Q0600  . mouth rinse   15 mL Mouth Rinse BID  . mupirocin ointment  1 application Nasal BID  . nystatin   Topical BID  . omega-3 acid ethyl esters  1 g Oral Daily  . pantoprazole  40 mg Oral Daily  . potassium chloride  20 mEq Oral Daily  . sodium chloride flush  3 mL Intravenous Q12H  . umeclidinium-vilanterol  1 puff Inhalation Daily   Continuous Infusions: . sodium chloride     PRN Meds: sodium chloride, acetaminophen **OR** acetaminophen, albuterol, ALPRAZolam, ondansetron **OR** ondansetron (ZOFRAN) IV, oxyCODONE-acetaminophen **AND** oxyCODONE, polyethylene glycol, sodium chloride flush  Home Meds: Prior to Admission medications   Medication Sig Start Date End Date Taking? Authorizing Provider  albuterol (PROAIR HFA) 108 (90 BASE) MCG/ACT inhaler Inhale 1 puff into the lungs every 4 (four) hours as needed. For shortness of breath Patient taking differently: Inhale 2 puffs  into the lungs every 4 (four) hours as needed. For shortness of breath 05/30/13  Yes Buriev, Arie Sabina, MD  albuterol (PROVENTIL) (2.5 MG/3ML) 0.083% nebulizer solution Take 2.5 mg by nebulization 2 (two) times daily as needed for wheezing or shortness of breath.    Yes [provider]  ALPRAZolam Duanne Moron) 1 MG tablet Take 1 mg by mouth 3 (three) times daily as needed for anxiety or sleep.  01/05/17  Yes [provider]  aspirin EC 81 MG tablet Take 81 mg by mouth daily.   Yes [provider]  atorvastatin (LIPITOR) 80 MG tablet Take 1 tablet (80 mg total) by mouth daily at 6 PM. Patient taking differently: Take 80 mg by mouth every morning.  05/26/17  Yes [name], MD  Cholecalciferol (VITAMIN D3) 5000 units CAPS Take 1 capsule (5,000 Units total) by mouth daily. 08/19/16  Yes Cassandria Anger, MD  clopidogrel (PLAVIX) 75 MG tablet Take 1 tablet (75 mg total) by mouth daily. 05/26/17  Yes [name], MD  docusate sodium (COLACE) 100 MG capsule Take 300 mg by mouth daily.   Yes [provider]    ferrous sulfate 325 (65 FE) MG tablet Take 1 tablet (325 mg total) by mouth daily with breakfast. 02/05/17  Yes [name], MD  furosemide (LASIX) 20 MG tablet Take 20 mg by mouth 2 (two) times daily.   Yes [provider]  gabapentin (NEURONTIN) 300 MG capsule Take 1 capsule (300 mg total) by mouth 3 (three) times daily. 02/05/17  Yes [name], MD  Hydrocortisone (GERHARDT'S BUTT CREAM) CREA Apply 1 application topically 3 (three) times daily. 05/26/17  Yes [name], MD  insulin aspart (NOVOLOG) 100 UNIT/ML injection Inject 0-9 Units into the skin 3 (three) times daily with meals. Patient taking differently: Inject 1-18 Units into the skin 3 (three) times daily with meals. Per sliding scale 05/26/17  Yes [name], MD  insulin detemir (LEVEMIR) 100 UNIT/ML injection Inject 0.52 mLs (52 Units total) into the skin at bedtime. 05/26/17  Yes [name], MD  levothyroxine (SYNTHROID, LEVOTHROID) 150 MCG tablet Take 1 tablet (150 mcg total) by mouth daily at 6 (six) AM. 10/26/17  Yes Isaac Bliss, Rayford Halsted, MD  nystatin (MYCOSTATIN/NYSTOP) powder Apply topically 2 (two) times daily. 10/10/16  Yes Eber Jones, MD  Omega-3 Fatty Acids (FISH OIL PO) Take 1 capsule by mouth daily.    Yes [provider]  ondansetron (ZOFRAN) 4 MG tablet Take 4 mg by mouth every 8 (eight) hours as needed for vomiting.  01/01/17  Yes [provider]  oxyCODONE-acetaminophen (PERCOCET) 10-325 MG tablet Take 1 tablet by mouth every 6 (six) hours as needed for pain.   Yes [provider]  OXYGEN Inhale 2 L into the lungs continuous.   Yes [provider]  pantoprazole (PROTONIX) 40 MG tablet Take 1 tablet (40 mg total) by mouth daily. 02/05/17  Yes [name], MD  polyethylene glycol [facility] / Floria Raveling) packet Take 17 g by mouth daily as needed for mild constipation or moderate constipation. 10/16/17  Yes Johnson, Clanford L, MD  potassium chloride (MICRO-K) 10 MEQ CR capsule  Take 10 mEq by mouth 2 (two) times daily.   Yes [provider]  tiZANidine (ZANAFLEX) 4 MG tablet Take 8 mg by mouth 2 (two) times daily.  01/10/17  Yes [provider]  topiramate (TOPAMAX) 25 MG tablet Take 75 mg by mouth daily.  01/02/17  Yes [provider]  umeclidinium-vilanterol (ANORO ELLIPTA) 62.5-25 MCG/INH AEPB Inhale 1 puff into the lungs daily. 05/26/17  Yes [name], MD  VICTOZA 18 MG/3ML SOPN Inject 1.2 mg into the skin daily.  01/01/17  Yes [provider]  SURE COMFORT INS SYR 1CC/28G 28G X 1/2" 1 ML MISC USE TO INJECT INSULIN UP TO 4 TIMES DAILY. 03/06/17   Cassandria Anger, MD    Allergies:    Allergies  Allergen Reactions  . Dilaudid [Hydromorphone Hcl] Itching  . Actifed Cold-Allergy [Chlorpheniramine-Phenylephrine]     "I was sick and red and it didn't agree with me at all"  . Doxycycline Nausea And Vomiting  . Other Cough    Pt states she is allergic to ragweed and that she starts coughing and sneezing like crazy  . Penicillins Hives and Itching    Tolerates Rocephin Has patient had a PCN reaction causing immediate rash, facial/tongue/throat swelling, SOB or lightheadedness with hypotension: Yes Has patient had a PCN reaction causing severe rash involving mucus membranes or skin necrosis: No Has patient had a PCN reaction that required hospitalization Yes Has patient had a PCN reaction occurring within the last 10 years: No If all of the above answers are "NO", then may proceed with Cephalosporin use.   . Reglan [Metoclopramide] Itching  . Valium [Diazepam] Itching  . Vistaril [Hydroxyzine Hcl] Itching    Social History:   Social History   Socioeconomic History  . Marital status: Widowed    Spouse name: Not on file  . Number of children: Not on file  . Years of education: Not on file  . Highest education level: Not on file  Occupational History  . Not on file  Social Needs  . Financial resource strain: Not on file   . Food insecurity:    Worry: Not on file    Inability: Not on file  . Transportation needs:    Medical: Not on file    Non-medical: Not on file  Tobacco Use  . Smoking status: Former Smoker    Packs/day: 0.25    Types: Cigarettes    Last attempt to quit: 11/24/2014    Years since quitting: 3.0  . Smokeless tobacco: Never Used  Substance and Sexual Activity  . Alcohol use: No    Alcohol/week: 0.0 oz  . Drug use: No  . Sexual activity: Not Currently    Birth control/protection: Surgical  Lifestyle  . Physical activity:    Days per week: Not on file    Minutes per session: Not on file  . Stress: Not on file  Relationships  . Social connections:    Talks on phone: Not on file    Gets together: Not on file    Attends religious service: Not on file    Active member of club or organization: Not on file    Attends meetings of clubs or organizations: Not on file    Relationship status: Not on file  . Intimate partner violence:    Fear of current or ex partner: Not on file    Emotionally abused: Not on file    Physically abused: Not on file    Forced sexual activity: Not on file  Other Topics Concern  . Not on file  Social History Narrative  . Not on file    Family History:   The patient's family history includes Cerebral palsy in her brother; Diabetes in her brother and other; Heart attack in her father and other; Pneumonia in her brother; Stroke  in her mother.  ROS:  Please see the history of present illness.  No bleeding, fevers, chills. + chronic edema. All other ROS reviewed and negative.     Physical Exam/Data:   Vitals:   11/24/17 1611 11/24/17 1942 11/24/17 2108 11/25/17 0500  BP: (!) 128/52  (!) 175/73 (!) 131/59  Pulse: 69  66 71  Resp: 20  18 16   Temp: 97.9 F (36.6 C)  98.5 F (36.9 C) 98.6 F (37 C)  TempSrc: Oral  Oral Oral  SpO2: 100% 99% 93% 100%  Weight:    (!) 300 lb 14.4 oz (136.5 kg)  Height:        Intake/Output Summary (Last 24 hours) at  11/25/2017 1040 Last data filed at 11/25/2017 0656 Gross per 24 hour  Intake 746 ml  Output 2350 ml  Net -1604 ml   Filed Weights   11/24/17 1314 11/24/17 1610 11/25/17 0500  Weight: 299 lb 3.2 oz (135.7 kg) (!) 302 lb 2 oz (137 kg) (!) 300 lb 14.4 oz (136.5 kg)   Body mass index is 48.57 kg/m.  General: Morbidly obese WF in no acute distress.  Head: Normocephalic, atraumatic, sclera non-icteric, no xanthomas, nares are without discharge.  Neck: Negative for carotid bruits. JVD not elevated. Lungs: Clear bilaterally to auscultation without wheezes, rales, or rhonchi. Breathing is unlabored. On O2. Heart: RRR with S1 S2. No murmurs, rubs, or gallops appreciated. Abdomen: Soft, non-tender, non-distended with normoactive bowel sounds. No hepatomegaly. No rebound/guarding. No obvious abdominal masses. Msk:  Strength and tone appear normal for age. Extremities: No clubbing or cyanosis. 1+ BLE edema superimposed on very large baseline leg habitus - general venous stasis suggested.  Distal pedal pulses are 2+ and equal bilaterally. Neuro: Alert and oriented X 3. No facial asymmetry. No focal deficit. Moves all extremities spontaneously. Psych:  Responds to questions appropriately with a normal affect.  EKG:  The EKG was personally reviewed appears to show NSR with low P wave amplitude and episodic PACs  Relevant CV Studies: As above  Laboratory Data:  Chemistry Recent Labs  Lab 11/24/17 1218 11/25/17 0502  NA 142 141  K 3.7 4.2  CL 108 108  CO2 26 26  GLUCOSE 70 207*  BUN 30* 27*  CREATININE 1.37* 1.41*  CALCIUM 8.1* 8.5*  GFRNONAA 39* 38*  GFRAA 45* 44*  ANIONGAP 8 7    Recent Labs  Lab 11/24/17 1218  PROT 6.3*  ALBUMIN 3.3*  AST 17  ALT 10  ALKPHOS 84  BILITOT 0.7   Hematology Recent Labs  Lab 11/24/17 1218 11/25/17 0502  WBC 8.1 6.9  RBC 3.23* 3.27*  HGB 9.0* 9.1*  HCT 29.6* 30.2*  MCV 91.6 92.4  MCH 27.9 27.8  MCHC 30.4 30.1  RDW 15.1 15.1  PLT 273  300   Cardiac Enzymes Recent Labs  Lab 11/24/17 1218 11/24/17 1733 11/24/17 2249 11/25/17 0502  TROPONINI <0.03 <0.03 <0.03 <0.03   No results for input(s): TROPIPOC in the last 168 hours.  BNPNo results for input(s): BNP, PROBNP in the last 168 hours.  DDimer No results for input(s): DDIMER in the last 168 hours.  Radiology/Studies:  Dg Chest Portable 1 View  Result Date: 11/24/2017 CLINICAL DATA:  Acute onset chest pain. Patient fell out of bed earlier today. EXAM: PORTABLE CHEST 1 VIEW COMPARISON:  10/15/2017, 10/13/2017, 09/02/2017 and earlier. FINDINGS: Suboptimal inspiration accounts for crowded bronchovascular markings, especially in the bases, and accentuates the cardiac silhouette. Taking this into account, cardiac  silhouette normal in size for AP portable technique. Lungs clear. Bronchovascular markings normal. Pulmonary vascularity normal. No visible pleural effusions. No pneumothorax. IMPRESSION: Suboptimal inspiration.  No acute cardiopulmonary disease. Electronically Signed   By: Evangeline Dakin M.D.   On: 11/24/2017 12:41    Assessment and Plan:   1. Recurrent episodes of altered mental status/possible syncope 2. Documented hypotension 3. IDDM with intermittent hypoglycemia 4. CAD with recurrent CP this admission 5. Severe hypothyroidism 6. NSR with PACs vs WAP 7. Acute on chronic appearing anemia  Unusual presentation of recurrent syncope without clear trigger - appears to be related to hypotension. We have not seen any acute heart rhythm disturbances but we have not actually captured a syncopal event while on an event recorder before. She is on very little by way of antihypertensives, only Lasix 20mg  BID. She has edema on exam but given the skin thickness I suspect this is also lymphedema related to her morbid obesity. The Lasix remains on hold until we can get a handle on her blood pressure. It does sound like the patient does some self-dosing of meds (I.e. Her  potassium when she is having leg cramps) but has not mentioned adjusting any other meds recently other than the adjustment of her thyroid supplement as above.  Her thyroid disturbances have been labile this year. I do suspect the TSH of 199 was due to the fact that her thyroid supplement was cut back so drastically in 08/2017 after the suppressed TSH came back. Given her hypotension I question whether the diagnosis of primary adrenal insufficiency should be investigated, as this can sometimes co-exist with hypothyroidism. A random cortisol would not exclude the diagnosis, would need to consider ACTH stim testing.  Chest pain is difficult to sort out, question related to hypotension - troponins are negative and EKG is nonacute.   Will review above plan with Dr. Harl Bowie.   For questions or updates, please contact [place] Please consult [email or web address] for contact info under Cardiology/STEMI.    Signed, Charlie Pitter, PA-C  11/25/2017 10:40 AM   Attending note  Patient seen and discussed with PA Dunn, I agree with her documentation above. 67 yo female history of CAD with prior DES to LAD and PTCA of D1 in Jan 3762, chronic diastolic HF, HTN, HL, DM2, COPD, CKD 3, and syncope. From notes prior syncope thought to be orthostatic. Admit 10/2017 with recurrent syncope. Severe hypothyroidism, brady to 40s-50s at that time. Lopressor was stopped. Repeat admit later 10/2017 and was orhostatic, imdur stopped. Readmitted with syncope.    ER vitals: 85/52 p 81 98% 2L [place] Hgb 9, Plt 273, K 3.7, Cr 1.37, TSH 34, evening cortisol 6.9 Trop neg x 4 CXR no acute process EKG SR, PACs 10/2017 echo LVEF 83-15%, grade I diastolic dysfunction.    Recurrent orthostatic syncope despite changes in meds over the last several months. In ER HR increases 66 to 99 with standing. Repeat showes SBP drops 22 points with lying to sitting. Unclear if this is medication related. She looks to have poorly controlled DM2 which  could play a role with potential autonomic neuropathy.. Also with severe hypothyroidism  if could have coexisting adrenal in  sufficiency. Would repeat cortisol but as random AM cortisol level. She reports adequate hydration. Caution with diuretics, she seems to have a significant component of lymphedema that in generally does not respond well to diuretics. If normal cortisol level no and no arrhythmias overnight would start midodrine tomorrow.   Carlyle Dolly  MD

## 2017-11-25 NOTE — Care Management Note (Signed)
Case Management Note  Patient Details  Name: Stephanie Combs MRN: [id number] Date of Birth: November 05, 1950  Subjective/Objective:    Pt admitted after syncopal episode at home. Pt says she was "almost dead". Pt says she has been compliant with diet and medications. She wants to make sure they find out what is wrong with her before she leaves, tells me the doctors are starting and stopping different medications to figure out which one is making this happen to her. Pt lives alone, she is active with [facility] for RN and PT. She has home oxygen pta. Pt is active with [facility] services. This is pt's 5th admission is 6 months.              Action/Plan: Pt plans to return home with resumption of [place] services. CM has discussed admission with [facility] rep, CM has asked for any information the [facility] nurse may have documented that may help [place] better care for the patient. Notes from [place] noted. CM will cont to follow.  Expected Discharge Date:     11/27/2017             Expected Discharge Plan:  [place]  In-House Referral:  NA  Discharge planning Services  CM Consult  Post Acute Care Choice:  Home Health Choice offered to:  Patient  DME Arranged:    DME Agency:     HH Arranged:  RN, PT [place] Agency:  [facility]  Status of Service:  In process, will continue to follow  If discussed at Long Length of Stay Meetings, dates discussed:    Additional Comments:  Sherald Barge, RN 11/25/2017, 3:58 PM

## 2017-11-25 NOTE — Care Management Obs Status (Signed)
[facility] NOTIFICATION   Patient Details  Name: Stephanie Combs MRN: [id number] Date of Birth: [birth year]   Medicare Observation Status Notification Given:  Yes    Sherald Barge, RN 11/25/2017, 11:44 AM

## 2017-11-25 NOTE — Progress Notes (Addendum)
Inpatient Diabetes Program Recommendations  AACE/ADA: New Consensus Statement on Inpatient Glycemic Control (2015)  Target Ranges:  Prepandial:   less than 140 mg/dL      Peak postprandial:   less than 180 mg/dL (1-2 hours)      Critically ill patients:  140 - 180 mg/dL   Lab Results  Component Value Date   GLUCAP 174 (H) 11/25/2017   HGBA1C 8.1 (H) 10/13/2017    Review of Glycemic Control Results for Stephanie Combs, Stephanie Combs (MRN [id number]) as of 11/25/2017 08:54  Ref. Range 11/24/2017 14:03 11/24/2017 14:43 11/24/2017 16:14 11/24/2017 21:01 11/25/2017 07:46  Glucose-Capillary Latest Ref Range: 70 - 99 mg/dL 42 (LL) 130 (H) 181 (H) 233 (H) 174 (H)   Diabetes history: DM2 Outpatient Diabetes medications: Levemir 52 units + Novolog 1-18 units tid + Victoza 1.2 mg qd Current orders for Inpatient glycemic control: Novolog moderate correction tid Inpatient Diabetes Program Recommendations:   Will speak with patient concerning hypoglycemia @ home. 9:30 Spoke with patient @ bedside. Patient said her CBG was 138 the morning that she became unresponsive. Patient states she had only taken 40 units of Levemir the previous night instead of 52 and did not take Novolog insulin that morning. Patient states she eats very little and stays up through the night. Patient keeps her MD appts and uses a Novolog correction scale instead of covering carbohydrates. Patient wants to be back on her Levemir and explained that her insulin needs will be evaluated and Levemir added per MD as insulin needs assessed.  Thank you, Nani Gasser. Acadia Thammavong, RN, MSN, CDE  Diabetes Coordinator Inpatient Glycemic Control Team Team Pager [phone] (8am-5pm) 11/25/2017 9:05 AM

## 2017-11-25 NOTE — Plan of Care (Signed)
  Problem: Acute Rehab PT Goals(only PT should resolve) Goal: Pt Will Go Supine/Side To Sit Outcome: Progressing Flowsheets (Taken 11/25/2017 1224) Pt will go Supine/Side to Sit: with modified independence Goal: Patient Will Transfer Sit To/From Stand Outcome: Progressing Flowsheets (Taken 11/25/2017 1224) Patient will transfer sit to/from stand: with modified independence Goal: Pt Will Transfer Bed To Chair/Chair To Bed Outcome: Progressing Flowsheets (Taken 11/25/2017 1224) Pt will Transfer Bed to Chair/Chair to Bed: with modified independence Goal: Pt Will Ambulate Outcome: Progressing Flowsheets (Taken 11/25/2017 1224) Pt will Ambulate: with modified independence;75 feet;with rolling walker   12:25 PM, 11/25/17 Lonell Grandchild, MPT Physical Therapist with [facility] [phone] office [phone] mobile phone

## 2017-11-26 LAB — BASIC METABOLIC PANEL
Anion gap: 7 (ref 5–15)
BUN: 28 mg/dL — ABNORMAL HIGH (ref 8–23)
CO2: 27 mmol/L (ref 22–32)
Calcium: 8.8 mg/dL — ABNORMAL LOW (ref 8.9–10.3)
Chloride: 104 mmol/L (ref 98–111)
Creatinine, Ser: 1.32 mg/dL — ABNORMAL HIGH (ref 0.44–1.00)
GFR calc Af Amer: 47 mL/min — ABNORMAL LOW (ref >60)
GFR calc non Af Amer: 41 mL/min — ABNORMAL LOW (ref >60)
Glucose, Bld: 245 mg/dL — ABNORMAL HIGH (ref 70–99)
Potassium: 4.2 mmol/L (ref 3.5–5.1)
Sodium: 138 mmol/L (ref 135–145)

## 2017-11-26 LAB — GLUCOSE, CAPILLARY
Glucose-Capillary: 246 mg/dL — ABNORMAL HIGH (ref 70–99)
Glucose-Capillary: 202 mg/dL — ABNORMAL HIGH (ref 70–99)

## 2017-11-26 LAB — MAGNESIUM: Magnesium: 2 mg/dL (ref 1.7–2.4)

## 2017-11-26 LAB — CORTISOL-AM, BLOOD: Cortisol - AM: 3.7 ug/dL — ABNORMAL LOW (ref 6.7–22.6)

## 2017-11-26 MED ORDER — MIDODRINE HCL 2.5 MG PO TABS: 2.5000 mg | ORAL_TABLET | Freq: Three times a day (TID) | ORAL | 0 refills | Status: DC

## 2017-11-26 MED ORDER — MIDODRINE HCL 5 MG PO TABS
2.5000 mg | ORAL_TABLET | Freq: Three times a day (TID) | ORAL | Status: DC
  Administered 2017-11-26: 2.5 mg via ORAL
  Filled 2017-11-26: qty 1

## 2017-11-26 MED ORDER — INSULIN DETEMIR 100 UNIT/ML ~~LOC~~ SOLN: 40.0000 [IU] | Freq: Every day | SUBCUTANEOUS | 0 refills | Status: DC

## 2017-11-26 MED ORDER — INSULIN DETEMIR 100 UNIT/ML ~~LOC~~ SOLN
28.0000 [IU] | Freq: Every day | SUBCUTANEOUS | Status: DC
  Administered 2017-11-26: 28 [IU] via SUBCUTANEOUS
  Filled 2017-11-26 (×2): qty 0.28

## 2017-11-26 NOTE — Care Management Note (Signed)
Case Management Note  Patient Details  Name: Stephanie Combs MRN: [id number] Date of Birth: 1951-02-20  Expected Discharge Date:  11/26/17               Expected Discharge Plan:  [place]  In-House Referral:  NA  Discharge planning Services  CM Consult  Post Acute Care Choice:  Home Health Choice offered to:  Patient  HH Arranged:  RN, PT [facility] Agency:  [place]  Status of Service:  Completed, signed off  If discussed at [place] of Stay Meetings, dates discussed:    Additional Comments: Pt discharging home today with resumption of [place] services. Per [facility] rep, pt had been discharged earlier this week from SN d/t missed visits and not returning phone calls. Pt says this I not true, she wants the nurse to keep coming. Pt is aware HH has 48 hrs to make next visit. Santiago Glad, [facility] rep, aware of DC today. Pt active with [facility]. [facility] requested [place] consult while in hospital, MD did not feel this would be beneficial at this time. CM has offered referral to Integrated HC/para-medicine Program, pt is not interested.   Sherald Barge, RN 11/26/2017, 1:02 PM

## 2017-11-26 NOTE — Discharge Summary (Signed)
Physician Discharge Summary  Stephanie Combs FGH:[id number] DOB: 08-05-1950 DOA: 11/24/2017  PCP: [name], MD  Admit date: 11/24/2017 Discharge date: 11/26/2017  Admitted From: Home Disposition:  Home  Recommendations for Outpatient Follow-up:  1. Follow up with PCP in 1-2 weeks 2. Please obtain BMP/CBC in one week   Home Health:YES Equipment/Devices: 2L  Discharge Condition: Stable CODE STATUS: FULL Diet recommendation: Heart Healthy / Carb Modified   Brief/Interim Summary:  67 year old female with a history of morbid obesity, COPD with chronic respiratoryfailureon home O2, chronicback pain, anxiety, IDDM, CKD III, hypothyroidism, chronic diastolic CHF, CAD s/p DES to LAD and angioplasty to D1 in 05/2017, DDD, pedal edema presenting with recurrent syncope.  Patient states that after she took a nap with her cat, the patient was sitting up on the side of her bed.  The next thing she remembers is lying on the floor by her bed.  She denies any prodromal type symptoms.  She denies biting her tongue or urinary or bowel incontinence.  This represents the fourth admission since late April for recurrent syncope.  During her each of her last admissions, she was found to be orthostatic.  Her beta-blocker was subsequently discontinued during her admission on 10/13/2017.  Unfortunately, the patient remains orthostatic.  In addition, it was felt that her profound hypothyroidism may also be contributing.  As result, her Synthroid has been gradually increased by her primary care provider.  The patient was noted to have BP 60/40 and CBG 49 by EMS.  She was given D50. She had recurrent hypotension documented in the ER down to 67/56 at 1230pm, with recovery BP at 1248 to 105/37     Discharge Diagnoses:  Recurrent Syncope -likely multifactorial including orthostatic hypotension, hypoglycemia, severe hypothyroidism and polypharmacy -the patient frequently self-adjusts medications without notifying her  physicians which may have also contributed to her hypoglycemia -Appreciate cardiology input -Telemetry without concerning dysrhythmia -10/14/2017 echo EF 65-70%, grade 1 DD, mild MR  Orthostatic hypotension -Suspect this may be in part due to her polypharmacy contributing to her orthostasis with medications including but not not limited to gabapentin, Xanax, Zanaflex -diabetic dysautonomia may also be contributing -Beta-blocker has previously been discontinued -May need to tolerate higher baseline blood pressure -Awaiting a.m. cortisol, but would anticipate electrolyte abnormalities including hyperkalemia and hyponatremia -started midodrine 2.5 mg tid  Hypothyroidism -Continue Synthroid -TSH is improving -11/24/2017 TSH 34.01  Hypoglycemia/diabetes mellitus type 2 uncontrolled with hyperglycemia -Presented with CBG 42--initially held levemir during hospitalization -Patient frequently self adjust her own Levemir -restart levemir at 40 units daily -Monitor CBGs--no further hypoglycemia after restart levemir -10/13/2017 hemoglobin A 1C--8.1  Chest Pain -troponins neg x 4 -defer to cardiology -personally reviewed EKG--no concerning ST-T changes -05/2017 she underwent R/LHC showing 75% ostial LAD, 70% prox LAD1 with 80% side branch ostial D1, 60% prox LAD2 -> felt possibly to need bypass but not a suitable candidate for s   CKD stage III -Baseline creatinine 1.0-1.3 -A.m. BMP  Coronary artery disease -Continue aspirin, Plavix, statin  Chronic respiratory failure with hypoxia -Stable on 2 L at home -Stable presently  COPD -Stable without exacerbation -continue Anoro  Morbid Obesity -BMI 49.2 -lifestyle modification       Discharge Instructions   Allergies as of 11/26/2017      Reactions   Dilaudid [hydromorphone Hcl] Itching   Actifed Cold-allergy [chlorpheniramine-phenylephrine]    "I was sick and red and it didn't agree with me at all"   Doxycycline  Nausea And Vomiting  Other Cough   Pt states she is allergic to ragweed and that she starts coughing and sneezing like crazy   Penicillins Hives, Itching   Tolerates Rocephin Has patient had a PCN reaction causing immediate rash, facial/tongue/throat swelling, SOB or lightheadedness with hypotension: Yes Has patient had a PCN reaction causing severe rash involving mucus membranes or skin necrosis: No Has patient had a PCN reaction that required hospitalization Yes Has patient had a PCN reaction occurring within the last 10 years: No If all of the above answers are "NO", then may proceed with Cephalosporin use.   Reglan [metoclopramide] Itching   Valium [diazepam] Itching   Vistaril [hydroxyzine Hcl] Itching      Medication List    TAKE these medications   albuterol (2.5 MG/3ML) 0.083% nebulizer solution Commonly known as:  PROVENTIL Take 2.5 mg by nebulization 2 (two) times daily as needed for wheezing or shortness of breath. What changed:  Another medication with the same name was changed. Make sure you understand how and when to take each.   albuterol 108 (90 Base) MCG/ACT inhaler Commonly known as:  PROAIR HFA Inhale 1 puff into the lungs every 4 (four) hours as needed. For shortness of breath What changed:    how much to take  additional instructions   ALPRAZolam 1 MG tablet Commonly known as:  XANAX Take 1 mg by mouth 3 (three) times daily as needed for anxiety or sleep.   aspirin EC 81 MG tablet Take 81 mg by mouth daily.   atorvastatin 80 MG tablet Commonly known as:  LIPITOR Take 1 tablet (80 mg total) by mouth daily at 6 PM. What changed:  when to take this   clopidogrel 75 MG tablet Commonly known as:  PLAVIX Take 1 tablet (75 mg total) by mouth daily.   docusate sodium 100 MG capsule Commonly known as:  COLACE Take 300 mg by mouth daily.   ferrous sulfate 325 (65 FE) MG tablet Take 1 tablet (325 mg total) by mouth daily with breakfast.   FISH OIL  PO Take 1 capsule by mouth daily.   furosemide 20 MG tablet Commonly known as:  LASIX Take 20 mg by mouth 2 (two) times daily.   gabapentin 300 MG capsule Commonly known as:  NEURONTIN Take 1 capsule (300 mg total) by mouth 3 (three) times daily.   Gerhardt's butt cream Crea Apply 1 application topically 3 (three) times daily.   insulin aspart 100 UNIT/ML injection Commonly known as:  novoLOG Inject 0-9 Units into the skin 3 (three) times daily with meals. What changed:    how much to take  additional instructions   insulin detemir 100 UNIT/ML injection Commonly known as:  LEVEMIR Inject 0.4 mLs (40 Units total) into the skin at bedtime. What changed:  how much to take   levothyroxine 150 MCG tablet Commonly known as:  SYNTHROID, LEVOTHROID Take 1 tablet (150 mcg total) by mouth daily at 6 (six) AM.   midodrine 2.5 MG tablet Commonly known as:  PROAMATINE Take 1 tablet (2.5 mg total) by mouth 3 (three) times daily with meals.   nystatin powder Commonly known as:  MYCOSTATIN/NYSTOP Apply topically 2 (two) times daily.   ondansetron 4 MG tablet Commonly known as:  ZOFRAN Take 4 mg by mouth every 8 (eight) hours as needed for vomiting.   oxyCODONE-acetaminophen 10-325 MG tablet Commonly known as:  PERCOCET Take 1 tablet by mouth every 6 (six) hours as needed for pain.   OXYGEN Inhale  2 L into the lungs continuous.   pantoprazole 40 MG tablet Commonly known as:  PROTONIX Take 1 tablet (40 mg total) by mouth daily.   polyethylene glycol packet Commonly known as:  MIRALAX / GLYCOLAX Take 17 g by mouth daily as needed for mild constipation or moderate constipation.   potassium chloride 10 MEQ CR capsule Commonly known as:  MICRO-K Take 10 mEq by mouth 2 (two) times daily.   SURE COMFORT INS SYR 1CC/28G 28G X 1/2" 1 ML Misc Generic drug:  INS SYRINGE/NEEDLE 1CC/28G USE TO INJECT INSULIN UP TO 4 TIMES DAILY.   tiZANidine 4 MG tablet Commonly known as:   ZANAFLEX Take 8 mg by mouth 2 (two) times daily.   topiramate 25 MG tablet Commonly known as:  TOPAMAX Take 75 mg by mouth daily.   umeclidinium-vilanterol 62.5-25 MCG/INH Aepb Commonly known as:  ANORO ELLIPTA Inhale 1 puff into the lungs daily.   VICTOZA 18 MG/3ML Sopn Generic drug:  liraglutide Inject 1.2 mg into the skin daily.   Vitamin D3 5000 units Caps Take 1 capsule (5,000 Units total) by mouth daily.       Allergies  Allergen Reactions  . Dilaudid [Hydromorphone Hcl] Itching  . Actifed Cold-Allergy [Chlorpheniramine-Phenylephrine]     "I was sick and red and it didn't agree with me at all"  . Doxycycline Nausea And Vomiting  . Other Cough    Pt states she is allergic to ragweed and that she starts coughing and sneezing like crazy  . Penicillins Hives and Itching    Tolerates Rocephin Has patient had a PCN reaction causing immediate rash, facial/tongue/throat swelling, SOB or lightheadedness with hypotension: Yes Has patient had a PCN reaction causing severe rash involving mucus membranes or skin necrosis: No Has patient had a PCN reaction that required hospitalization Yes Has patient had a PCN reaction occurring within the last 10 years: No If all of the above answers are "NO", then may proceed with Cephalosporin use.   . Reglan [Metoclopramide] Itching  . Valium [Diazepam] Itching  . Vistaril [Hydroxyzine Hcl] Itching    Consultations:  cardiology   Procedures/Studies: Dg Chest Portable 1 View  Result Date: 11/24/2017 CLINICAL DATA:  Acute onset chest pain. Patient fell out of bed earlier today. EXAM: PORTABLE CHEST 1 VIEW COMPARISON:  10/15/2017, 10/13/2017, 09/02/2017 and earlier. FINDINGS: Suboptimal inspiration accounts for crowded bronchovascular markings, especially in the bases, and accentuates the cardiac silhouette. Taking this into account, cardiac silhouette normal in size for AP portable technique. Lungs clear. Bronchovascular markings normal.  Pulmonary vascularity normal. No visible pleural effusions. No pneumothorax. IMPRESSION: Suboptimal inspiration.  No acute cardiopulmonary disease. Electronically Signed   By: Evangeline Dakin M.D.   On: 11/24/2017 12:41         Discharge Exam: Vitals:   11/26/17 0749 11/26/17 1246  BP:  124/63  Pulse:  64  Resp:  18  Temp:  97.8 F (36.6 C)  SpO2: 99% 98%   Vitals:   11/25/17 2115 11/26/17 0500 11/26/17 0749 11/26/17 1246  BP: 140/67 132/68  124/63  Pulse: 78 74  64  Resp: 20 18  18   Temp: 99 F (37.2 C) 98.4 F (36.9 C)  97.8 F (36.6 C)  TempSrc: Oral Oral  Oral  SpO2: 100% 100% 99% 98%  Weight:  (!) 138.5 kg (305 lb 6.4 oz)    Height:        General: Pt is alert, awake, not in acute distress Cardiovascular: RRR, S1/S2 +,  no rubs, no gallops Respiratory: bibasilar crackles, no wheeze Abdominal: Soft, NT, ND, bowel sounds + Extremities: no edema, no cyanosis   The results of significant diagnostics from this hospitalization (including imaging, microbiology, ancillary and laboratory) are listed below for reference.    Significant Diagnostic Studies: Dg Chest Portable 1 View  Result Date: 11/24/2017 CLINICAL DATA:  Acute onset chest pain. Patient fell out of bed earlier today. EXAM: PORTABLE CHEST 1 VIEW COMPARISON:  10/15/2017, 10/13/2017, 09/02/2017 and earlier. FINDINGS: Suboptimal inspiration accounts for crowded bronchovascular markings, especially in the bases, and accentuates the cardiac silhouette. Taking this into account, cardiac silhouette normal in size for AP portable technique. Lungs clear. Bronchovascular markings normal. Pulmonary vascularity normal. No visible pleural effusions. No pneumothorax. IMPRESSION: Suboptimal inspiration.  No acute cardiopulmonary disease. Electronically Signed   By: Evangeline Dakin M.D.   On: 11/24/2017 12:41     Microbiology: Recent Results (from the past 240 hour(s))  MRSA PCR Screening     Status: Abnormal    Collection Time: 11/24/17  9:51 PM  Result Value Ref Range Status   MRSA by PCR POSITIVE (A) NEGATIVE Final    Comment: RESULT CALLED TO, READ BACK BY AND VERIFIED WITH: LIGHTNER,N @ 0351 ON 7.17.19 BY BOWMAN,L        The GeneXpert MRSA Assay (FDA approved for NASAL specimens only), is one component of a comprehensive MRSA colonization surveillance program. It is not intended to diagnose MRSA infection nor to guide or monitor treatment for MRSA infections. Performed at [facility], [address] [place], [place] [address]      Labs: Basic Metabolic Panel: Recent Labs  Lab 11/24/17 1218 11/25/17 0502 11/26/17 0538  NA 142 141 138  K 3.7 4.2 4.2  CL 108 108 104  CO2 26 26 27   GLUCOSE 70 207* 245*  BUN 30* 27* 28*  CREATININE 1.37* 1.41* 1.32*  CALCIUM 8.1* 8.5* 8.8*  MG  --   --  2.0   Liver Function Tests: Recent Labs  Lab 11/24/17 1218  AST 17  ALT 10  ALKPHOS 84  BILITOT 0.7  PROT 6.3*  ALBUMIN 3.3*   No results for input(s): LIPASE, AMYLASE in the last 168 hours. No results for input(s): AMMONIA in the last 168 hours. CBC: Recent Labs  Lab 11/24/17 1218 11/25/17 0502  WBC 8.1 6.9  NEUTROABS 4.8  --   HGB 9.0* 9.1*  HCT 29.6* 30.2*  MCV 91.6 92.4  PLT 273 300   Cardiac Enzymes: Recent Labs  Lab 11/24/17 1218 11/24/17 1733 11/24/17 2249 11/25/17 0502  TROPONINI <0.03 <0.03 <0.03 <0.03   BNP: Invalid input(s): POCBNP CBG: Recent Labs  Lab 11/25/17 1148 11/25/17 1605 11/25/17 2113 11/26/17 0804 11/26/17 1102  GLUCAP 194* 247* 234* 246* 202*    Time coordinating discharge:  36 minutes  Signed:  Orson Eva, DO [facility] Pager: [phone] 11/26/2017, 12:50 PM

## 2017-11-26 NOTE — Progress Notes (Signed)
Patient discharged home today per MD orders. Patient vital signs WDL. IV removed and site WDL. Discharge Instructions including follow up appointments, medications, and education reviewed with patient. Patient verbalizes understanding. Patient is transported out via wheelchair.  

## 2017-11-26 NOTE — Progress Notes (Signed)
Inpatient Diabetes Program Recommendations  AACE/ADA: New Consensus Statement on Inpatient Glycemic Control (2015)  Target Ranges:  Prepandial:   less than 140 mg/dL      Peak postprandial:   less than 180 mg/dL (1-2 hours)      Critically ill patients:  140 - 180 mg/dL   Lab Results  Component Value Date   GLUCAP 246 (H) 11/26/2017   HGBA1C 8.1 (H) 10/13/2017    Review of Glycemic Control Results for Stephanie Combs, Stephanie Combs (MRN [id number]) as of 11/26/2017 09:23  Ref. Range 11/25/2017 16:05 11/25/2017 21:13 11/26/2017 08:04  Glucose-Capillary Latest Ref Range: 70 - 99 mg/dL 247 (H) 234 (H) 246 (H)   Diabetes history:  DM2 Outpatient Diabetes medications: Levemir 52 units + Novolog 1-18 units tid + Victoza 1.2 mg qd Current orders for Inpatient glycemic control: Novolog moderate correction   Inpatient Diabetes Program Recommendations:   Reviewed CBGs. -Restart portion of Levemir 0.2 units/kg=28 units daily  Thank you, Bethena Roys E. Tashon Capp, RN, MSN, CDE  Diabetes Coordinator Inpatient Glycemic Control Team Team Pager [phone] (8am-5pm) 11/26/2017 9:26 AM

## 2017-11-26 NOTE — Consult Note (Signed)
   [facility] CM Inpatient Consult   11/26/2017  Stephanie Combs 1950-11-24 [id number]    Patient discussed in Multidisciplinary meeting with [facility] Management team and MD Directors. Suggestion was made for goals of care consult.  Patient is active with [facility] Management program. [name] is followed by [facility] NP.  Telephone call made to inpatient RNCM to make aware of all above notes and request for goals of care discussion.  Appreciative of inpatient [facility] collaboration.   Marthenia Rolling, MSN-Ed, RN,BSN [facility] Liaison [phone]

## 2017-11-26 NOTE — Progress Notes (Signed)
Progress Note  Patient Name: Stephanie Combs Date of Encounter: 11/26/2017  Primary Cardiologist: Kate Sable, MD   Subjective   Nauesous this AM  Inpatient Medications    Scheduled Meds: . aspirin EC  81 mg Oral Daily  . atorvastatin  80 mg Oral q1800  . Chlorhexidine Gluconate Cloth  6 each Topical Q0600  . cholecalciferol  5,000 Units Oral Daily  . clopidogrel  75 mg Oral Daily  . docusate sodium  300 mg Oral Daily  . ferrous sulfate  325 mg Oral Q breakfast  . gabapentin  300 mg Oral TID  . heparin injection (subcutaneous)  5,000 Units Subcutaneous Q8H  . insulin aspart  0-15 Units Subcutaneous TID WC  . insulin detemir  28 Units Subcutaneous Daily  . levothyroxine  175 mcg Oral Q0600  . mouth rinse  15 mL Mouth Rinse BID  . mupirocin ointment  1 application Nasal BID  . nystatin   Topical BID  . omega-3 acid ethyl esters  1 g Oral Daily  . pantoprazole  40 mg Oral Daily  . potassium chloride  20 mEq Oral Daily  . sodium chloride flush  3 mL Intravenous Q12H  . umeclidinium-vilanterol  1 puff Inhalation Daily   Continuous Infusions: . sodium chloride     PRN Meds: sodium chloride, acetaminophen **OR** acetaminophen, albuterol, ALPRAZolam, ondansetron **OR** ondansetron (ZOFRAN) IV, oxyCODONE-acetaminophen **AND** oxyCODONE, polyethylene glycol, sodium chloride flush   Vital Signs    Vitals:   11/25/17 2002 11/25/17 2115 11/26/17 0500 11/26/17 0749  BP:  140/67 132/68   Pulse:  78 74   Resp:  20 18   Temp:  99 F (37.2 C) 98.4 F (36.9 C)   TempSrc:  Oral Oral   SpO2: 100% 100% 100% 99%  Weight:   (!) 305 lb 6.4 oz (138.5 kg)   Height:        Intake/Output Summary (Last 24 hours) at 11/26/2017 1118 Last data filed at 11/26/2017 2694 Gross per 24 hour  Intake 1080 ml  Output 1750 ml  Net -670 ml   Filed Weights   11/24/17 1610 11/25/17 0500 11/26/17 0500  Weight: (!) 302 lb 2 oz (137 kg) (!) 300 lb 14.4 oz (136.5 kg) (!) 305 lb 6.4 oz (138.5  kg)    Telemetry    SR - Personally Reviewed  ECG    na  Physical Exam   GEN: No acute distress.   Neck: No JVD Cardiac: RRR, no murmurs, rubs, or gallops.  Respiratory: Clear to auscultation bilaterally. GI: Soft, nontender, non-distended  MS: 1+ nonpitting edema; No deformity. Neuro:  Nonfocal  Psych: Normal affect   Labs    Chemistry Recent Labs  Lab 11/24/17 1218 11/25/17 0502 11/26/17 0538  NA 142 141 138  K 3.7 4.2 4.2  CL 108 108 104  CO2 26 26 27   GLUCOSE 70 207* 245*  BUN 30* 27* 28*  CREATININE 1.37* 1.41* 1.32*  CALCIUM 8.1* 8.5* 8.8*  PROT 6.3*  --   --   ALBUMIN 3.3*  --   --   AST 17  --   --   ALT 10  --   --   ALKPHOS 84  --   --   BILITOT 0.7  --   --   GFRNONAA 39* 38* 41*  GFRAA 45* 44* 47*  ANIONGAP 8 7 7      Hematology Recent Labs  Lab 11/24/17 1218 11/25/17 0502  WBC 8.1 6.9  RBC  3.23* 3.27*  HGB 9.0* 9.1*  HCT 29.6* 30.2*  MCV 91.6 92.4  MCH 27.9 27.8  MCHC 30.4 30.1  RDW 15.1 15.1  PLT 273 300    Cardiac Enzymes Recent Labs  Lab 11/24/17 1218 11/24/17 1733 11/24/17 2249 11/25/17 0502  TROPONINI <0.03 <0.03 <0.03 <0.03   No results for input(s): TROPIPOC in the last 168 hours.   BNPNo results for input(s): BNP, PROBNP in the last 168 hours.   DDimer No results for input(s): DDIMER in the last 168 hours.   Radiology    Dg Chest Portable 1 View  Result Date: 11/24/2017 CLINICAL DATA:  Acute onset chest pain. Patient fell out of bed earlier today. EXAM: PORTABLE CHEST 1 VIEW COMPARISON:  10/15/2017, 10/13/2017, 09/02/2017 and earlier. FINDINGS: Suboptimal inspiration accounts for crowded bronchovascular markings, especially in the bases, and accentuates the cardiac silhouette. Taking this into account, cardiac silhouette normal in size for AP portable technique. Lungs clear. Bronchovascular markings normal. Pulmonary vascularity normal. No visible pleural effusions. No pneumothorax. IMPRESSION: Suboptimal  inspiration.  No acute cardiopulmonary disease. Electronically Signed   By: Evangeline Dakin M.D.   On: 11/24/2017 12:41    Cardiac Studies    Patient Profile   Stephanie Combs is a 67 y.o. female with a hx of morbid obesity, COPD with chronic respiratory failure on home O2, chronic back pain, anxiety, IDDM, CKD III, hypothyroidism, chronic diastolic CHF, CAD s/p DES to LAD and angioplasty to D1 in 05/2017, DDD, pedal edema, anemia who is being seen today for the evaluation of syncope at the request of Dr. Carles Collet.    Assessment & Plan    1. Syncope - multiple recent admissions for syncope. Has been orthostatic, has not improved with adjusting meds over the last several weeks. Orthostatic this admit - AM cortisol pending, she has severe hypythyroidism and may have coexisting adrenal insufficiency - also with history of poorly controlled DM2, at risk for diabetic autnomic neuropathy - we will start midodrine 2.5mg  tid, would avoid florinef due to issues with edema. Agree with primary team polypharmacy could be playing a role in her symptoms, can try to wean some of these meds overtime as outpatient.    No further cardiac testing planned at this time, we will arrange f/u   [place] will sign off.   Medication Recommendations:  Start midodrine 2.5mg  tid Other recommendations (labs, testing, etc): none Follow up as an outpatient:  We will arrange in 3 weeks    For questions or updates, please contact [place] Please consult [email or web address] for contact info under Cardiology/STEMI.      Merrily Pew, MD  11/26/2017, 11:18 AM

## 2017-11-27 ENCOUNTER — Other Ambulatory Visit: Payer: Self-pay | Admitting: *Deleted

## 2017-11-27 NOTE — Patient Outreach (Signed)
Transition of care call #1 attempted. Pt discharged home from [facility] yesterday. She did not answer her phone but I did leave her a message that I need to verify she has her meds and a follow up appt with Dr. Maudie Mercury scheduled. I have also encouraged her to answer her phone and collaborate with the home health nurses so she has the support she needs during this time.  I have advised her I would like to come see her on Monday afternoon.  Eulah Pont. Myrtie Neither, MSN, [facility] Gerontological Nurse Practitioner [facility] Care Management [phone]

## 2017-11-30 ENCOUNTER — Other Ambulatory Visit: Payer: Self-pay | Admitting: *Deleted

## 2017-11-30 NOTE — Patient Outreach (Signed)
[name]) Care Management   11/30/2017  Stephanie Combs 04/04/1951 [id number]  Stephanie Combs is an 67 y.o. female  Subjective: Acute home visit post hospitalization for hypotension and hypoglycemia. [facility] is providing home health and started services yesterday. She has an apppointment with Dr. Maudie Mercury on 12/08/17.  Pt had a severe allergic reaction swelling of lips oropharynx with midodrine (new medication) and had to call EMS. Pt sxs started subsiding even before EMS arrived. She is not taking this medication now.  Objective:   Review of Systems  Constitutional: Negative.   HENT: Negative.   Eyes: Negative.   Respiratory: Negative.   Cardiovascular: Positive for chest pain and leg swelling.       States she had mild CP but did not take her NTG because of the HA. She waits to see if it will subside on it's own which it did.  Gastrointestinal: Positive for heartburn.  Genitourinary: Negative.   Musculoskeletal: Positive for back pain and myalgias.  Skin: Positive for rash.  Neurological: Positive for dizziness.  Endo/Heme/Allergies: Bruises/bleeds easily.  Psychiatric/Behavioral: Positive for depression.   BP 140/70 (BP Location: Left Arm, Patient Position: Sitting, Cuff Size: Large)   Pulse 70 Comment: AFIB  Resp 18   SpO2 96%  FBS 168, 2PM NFBS 116  Physical Exam  Constitutional: She is oriented to person, place, and time. She appears well-developed and well-nourished.  Cardiovascular:  AFIB  Respiratory: Effort normal and breath sounds normal.  GI: Soft. Bowel sounds are normal.  Neurological: She is alert and oriented to person, place, and time.  Skin: Skin is warm and dry.  Psychiatric: She has a normal mood and affect.   Patient was recently discharged from hospital and all medications have been reviewed. PT TAKING SOME MEDS AS SHE WAS BEFORE PER DR. Maudie Mercury. Please see below assessment.   Encounter Medications:   Outpatient Encounter Medications as  of 11/30/2017  Medication Sig Note  . albuterol (PROAIR HFA) 108 (90 BASE) MCG/ACT inhaler Inhale 1 puff into the lungs every 4 (four) hours as needed. For shortness of breath (Patient taking differently: Inhale 2 puffs into the lungs every 4 (four) hours as needed. For shortness of breath)   . albuterol (PROVENTIL) (2.5 MG/3ML) 0.083% nebulizer solution Take 2.5 mg by nebulization 2 (two) times daily as needed for wheezing or shortness of breath.    . ALPRAZolam (XANAX) 1 MG tablet Take 1 mg by mouth 3 (three) times daily as needed for anxiety or sleep.    Marland Kitchen aspirin EC 81 MG tablet Take 81 mg by mouth daily.   Marland Kitchen atorvastatin (LIPITOR) 80 MG tablet Take 1 tablet (80 mg total) by mouth daily at 6 PM. (Patient taking differently: Take 80 mg by mouth every morning. )   . Cholecalciferol (VITAMIN D3) 5000 units CAPS Take 1 capsule (5,000 Units total) by mouth daily.   . clopidogrel (PLAVIX) 75 MG tablet Take 1 tablet (75 mg total) by mouth daily.   Marland Kitchen docusate sodium (COLACE) 100 MG capsule Take 300 mg by mouth daily.   . ferrous sulfate 325 (65 FE) MG tablet Take 1 tablet (325 mg total) by mouth daily with breakfast.   . furosemide (LASIX) 20 MG tablet Take 20 mg by mouth 2 (two) times daily.   Marland Kitchen gabapentin (NEURONTIN) 600 MG tablet Take 600 mg by mouth 4 (four) times daily.   . Hydrocortisone (GERHARDT'S BUTT CREAM) CREA Apply 1 application topically 3 (three) times daily. 10/13/2017: Applied  to toes  . insulin aspart (NOVOLOG) 100 UNIT/ML injection Inject 0-9 Units into the skin 3 (three) times daily with meals. (Patient taking differently: Inject 1-18 Units into the skin 3 (three) times daily with meals. Per sliding scale)   . insulin detemir (LEVEMIR) 100 UNIT/ML injection Inject 0.4 mLs (40 Units total) into the skin at bedtime.   Marland Kitchen levothyroxine (SYNTHROID, LEVOTHROID) 150 MCG tablet Take 1 tablet (150 mcg total) by mouth daily at 6 (six) AM.   . nystatin (MYCOSTATIN/NYSTOP) powder Apply topically 2  (two) times daily.   . Omega-3 Fatty Acids (FISH OIL PO) Take 1 capsule by mouth daily.    . ondansetron (ZOFRAN) 4 MG tablet Take 4 mg by mouth every 8 (eight) hours as needed for vomiting.    Marland Kitchen oxyCODONE-acetaminophen (PERCOCET) 10-325 MG tablet Take 1 tablet by mouth every 6 (six) hours as needed for pain.   . OXYGEN Inhale 2 L into the lungs continuous.   . pantoprazole (PROTONIX) 40 MG tablet Take 1 tablet (40 mg total) by mouth daily. 11/30/2017: Pt states she is taking this bid.  . potassium chloride (MICRO-K) 10 MEQ CR capsule Take 10 mEq by mouth 2 (two) times daily. 11/30/2017: Takes  one in am and 3 in prm. Pt is emphatic that she is going to take it this way.  . SURE COMFORT INS SYR 1CC/28G 28G X 1/2" 1 ML MISC USE TO INJECT INSULIN UP TO 4 TIMES DAILY.   Marland Kitchen tiZANidine (ZANAFLEX) 4 MG tablet Take 8 mg by mouth 2 (two) times daily.  11/30/2017: Pt takes 1 tab in am and at 2pm and 2 tabs at bedtime.  . topiramate (TOPAMAX) 25 MG tablet Take 75 mg by mouth daily.    Marland Kitchen umeclidinium-vilanterol (ANORO ELLIPTA) 62.5-25 MCG/INH AEPB Inhale 1 puff into the lungs daily.   Marland Kitchen VICTOZA 18 MG/3ML SOPN Inject 1.2 mg into the skin daily.    . [DISCONTINUED] gabapentin (NEURONTIN) 300 MG capsule Take 1 capsule (300 mg total) by mouth 3 (three) times daily.   . [DISCONTINUED] midodrine (PROAMATINE) 2.5 MG tablet Take 1 tablet (2.5 mg total) by mouth 3 (three) times daily with meals. (Patient not taking: Reported on 11/30/2017)   . [DISCONTINUED] polyethylene glycol (MIRALAX / GLYCOLAX) packet Take 17 g by mouth daily as needed for mild constipation or moderate constipation.    No facility-administered encounter medications on file as of 11/30/2017.     Functional Status:   In your present state of health, do you have any difficulty performing the following activities: 11/24/2017 10/26/2017  Hearing? N N  Vision? N N  Difficulty concentrating or making decisions? N N  Walking or climbing stairs? Y Y   Dressing or bathing? N N  Doing errands, shopping? N Y  Conservation officer, nature and eating ? - Y  Using the Toilet? - N  In the past six months, have you accidently leaked urine? - N  Do you have problems with loss of bowel control? - N  Managing your Medications? - N  Managing your Finances? - N  Housekeeping or managing your Housekeeping? - Y  Some recent data might be hidden    Fall/Depression Screening:    Fall Risk  10/26/2017 08/19/2016 06/17/2016  Falls in the past year? Yes No No  Number falls in past yr: 2 or more - -  Injury with Fall? Yes - -  Risk Factor Category  High Fall Risk - -  Risk for fall due to :  History of fall(s) - -  Risk for fall due to: Comment Due to orthostasis - -  Follow up Falls evaluation completed;Falls prevention discussed - -   PHQ 2/9 Scores 10/26/2017 08/19/2016 06/17/2016 06/17/2016  PHQ - 2 Score 1 0 2 0  PHQ- 9 Score - - 11 -    THN CM Care Plan Problem One     Most Recent Value  Care Plan Problem One  Medication descrepancies  Role Documenting the Problem One  Care Management [place] for Problem One  Active  THN CM Short Term Goal #1   Pt to meet with Dr Maudie Mercury and be counseled on her medication compliance and that changes are possible in many of her meds. Please Clarify on next MD office what her current medication should look like.  THN CM Short Term Goal #1 Start Date  11/30/17  Interventions for Short Term Goal #1  Sent MD todays notes with concerns of descrepancies and unwillingness to follow discharge order med list on some meds.  THN CM Short Term Goal #2   Reorganize medications into meds currently taken and meds not currently taking and store these in a safe place. Pt is unwilling to remove these as often she is restated on meds.  THN CM Short Term Goal #2 Met Date  11/10/17  THN CM Short Term Goal #3  Pt to cooperative with health team members trying to assist her over the next month.  THN CM Short Term Goal #3 Start Date  11/30/17   Interventions for Short Tern Goal #3  Requested that pt answer her phone and cooperative with health team members trying to make arrangements to visit her.    THN CM Care Plan Problem Two     Most Recent Value  Care Plan Problem Two  Complete Advanced Directives.  Role Documenting the Problem Two  Care Management Coordinator  Care Plan for Problem Two  Active  THN CM Short Term Goal #1   Pt will complete a MOST form.  THN CM Short Term Goal #1 Start Date  10/27/17  THN CM Short Term Goal #1 Met Date   11/10/17  THN CM Short Term Goal #2   Pt will complete her HCPOA and Living  Will over the next 30 days.  THN CM Short Term Goal #2 Start Date  10/27/17      Assessment:  Multiple Medication Administration Discrepancies and sedating polypharmacy. Also pt is in the donut hole and needs assistance with her insulin meds. [place] has tried to engage pt without success, pt either doesn't answer her phone or doesn't wish to speak long enough to get appropriate information for applications. As mentioned above the midodrine was discontinued after a severe allergic reaction.  Gabapentin 600 mg vs 300 mg qid ( states stinging came back with only 300 mg) Pantoprazole 40 mg  - taking this bid. Potassium 10 meq - taking tid Tizanidine 25 mg - takes one in am, one at 2 pm and 2 at bedtime.  Inpt pharmacy has recommended primary MD, Dr. Maudie Mercury to re- evaluate meds and doses to consider reduction or discontinuation of some of these  Tizanidine  Gabapentin  Alprazolam  Oxycodone/APAP  Diabetes in acceptable control for her condition at this time. We would like her to be on the high side of normal that too low. CHF - No SOB. Has great edema but pt explains this is her usual.  Plan:  Attempted to reconcile meds,  however Ms. Eichhorn insists that the discharge list is wrong and she will continue to take her meds as only Dr. Maudie Mercury has instructed her.              Sending this assessment to Dr. Maudie Mercury to  consult with her specifically for her medication regimen as she will only take orders from him to change her regimen.  Encouraged to get HCPOA and LIving Will notorized.  I will be calling pt weekly for transition of care calls. I will see her in 2 weeks.   Eulah Pont. Myrtie Neither, MSN, [facility] Gerontological Nurse Practitioner [facility] Care Management [phone]

## 2017-12-01 ENCOUNTER — Other Ambulatory Visit: Payer: Self-pay

## 2017-12-01 NOTE — Patient Outreach (Signed)
[facility]) Care Management  12/01/2017  Stephanie Combs 11-Mar-1951 [id number]  Outreach attempt made to Stephanie Combs for medication assistance.  I had received an in-basket message from [facility] NP, Deloria Lair that patient needs assistance with insulin.    Phone initially was picked up and nobody said hello and then hung up.  Return call went straight to voice mail.  Previous attempts unsuccessful and case had been closed.    Plan: Follow up with Deloria Lair, NP.   Joetta Manners, [place] [phone]

## 2017-12-07 ENCOUNTER — Other Ambulatory Visit: Payer: Self-pay | Admitting: *Deleted

## 2017-12-07 NOTE — Patient Outreach (Signed)
Transition of care call. Stephanie Combs was home and I tried to find out when her upcoming MD appts are. She said she will see Stephanie Combs Tomorrow. She saw Stephanie Combs last week and he injected her shoulder. She has other appts coming up but she could not tell me when I rquested an appt with her next Monday at 1:00 and she has written this down.  She reports she has been feeling a bit queasy and weak. Her glucose this am was 90 which she states she rather wake up with it in the 120's. She did have a bed time snack of peanut butter crackers.  I will call today to see if we can enroll her in th [employer] HF scale only monitoring and request an electronic blood pressure cuff. I have sent in a referral form with this requests  Stephanie Combs C. Myrtie Neither, MSN, [facility] Gerontological Nurse Practitioner [facility] Care Management [phone]

## 2017-12-14 ENCOUNTER — Other Ambulatory Visit: Payer: Self-pay | Admitting: *Deleted

## 2017-12-14 NOTE — Patient Outreach (Signed)
Routine home visit in lieu of a transition of care call.  Pt has been overheated because her AC went out for a while last night. She was able to put on the ceiling fan to keep her moderately comfortable.  No ED visits this week.  Objective: BP (!) 90/44   Pulse 74   Resp 18   SpO2 96%  RRR Lungs are clear Extremities with 3+edema  Assessment: Hypotension                       CHF stable                       DM stable  Plan: Hold lasix today and drink plenty of fluids.           Call NP for any problems.            I will be calling for future transition of care calls and see her next month.  Eulah Pont. Myrtie Neither, MSN, [facility] Gerontological Nurse Practitioner [facility] Care Management [phone]

## 2017-12-21 ENCOUNTER — Other Ambulatory Visit: Payer: Medicare HMO | Admitting: *Deleted

## 2017-12-22 ENCOUNTER — Encounter: Payer: Self-pay | Admitting: Student

## 2017-12-22 ENCOUNTER — Ambulatory Visit (INDEPENDENT_AMBULATORY_CARE_PROVIDER_SITE_OTHER): Payer: Medicare HMO | Admitting: Student

## 2017-12-22 VITALS — BP 144/60 | HR 70 | Ht 66.0 in | Wt 305.0 lb

## 2017-12-22 DIAGNOSIS — R55 Syncope and collapse: Secondary | ICD-10-CM | POA: Diagnosis not present

## 2017-12-22 DIAGNOSIS — E785 Hyperlipidemia, unspecified: Secondary | ICD-10-CM

## 2017-12-22 DIAGNOSIS — I5032 Chronic diastolic (congestive) heart failure: Secondary | ICD-10-CM

## 2017-12-22 DIAGNOSIS — E039 Hypothyroidism, unspecified: Secondary | ICD-10-CM

## 2017-12-22 DIAGNOSIS — N183 Chronic kidney disease, stage 3 unspecified: Secondary | ICD-10-CM

## 2017-12-22 DIAGNOSIS — I25118 Atherosclerotic heart disease of native coronary artery with other forms of angina pectoris: Secondary | ICD-10-CM

## 2017-12-22 DIAGNOSIS — I10 Essential (primary) hypertension: Secondary | ICD-10-CM | POA: Diagnosis not present

## 2017-12-22 MED ORDER — FUROSEMIDE 20 MG PO TABS: 20.0000 mg | ORAL_TABLET | Freq: Every day | ORAL | 3 refills | Status: DC

## 2017-12-22 NOTE — Patient Instructions (Addendum)
Medication Instructions:   Your physician has recommended you make the following change in your medication:   Decrease furosemide to 20 mg by mouth daily.  Do not take benadryl.  You may take claritin or loratadine 10 mg by mouth daily.  Continue all other medications the same.  Labwork:  NONE  Testing/Procedures:  NONE  Follow-Up:  Your physician recommends that you schedule a follow-up appointment in: keep already scheduled visit with Dr. [name].  Any Other Special Instructions Will Be Listed Below (If Applicable).  If you need a refill on your cardiac medications before your next appointment, please call your pharmacy.

## 2017-12-22 NOTE — Progress Notes (Signed)
Cardiology Office Note    Date:  12/22/2017   ID:  Stephanie Combs, DOB 02-Jan-1951, MRN [id number]  PCP:  [name], MD  Cardiologist: Kate Sable, MD    Chief Complaint  Patient presents with  . Hospitalization Follow-up    History of Present Illness:    Stephanie Combs is a 66 y.o. female with past medical history of CAD (s/p DES to [place] angioplasty to [place] 05/2017), chronic diastolic CHF, HTN, HLD, IDDM, COPD, Hypothyroidism, Stage 3 CKD, and recurrent syncopal events who presents to the office today for hospital follow-up.   She was last examined by myself on 11/09/2017 for follow-up from multiple hospitalizations for recurrent syncopal events in which she was found to be significantly hypothyroid with TSH elevated to 199. She denied any repeat episodes of syncope at the time of her visit with heart rate and BP remaining well-controlled following the discontinuation of Lopressor.  In the interim, she presented back to [facility] ED on 11/24/2017 for a recurrent syncopal event. Upon EMS arrival, BP was found to be at 60/40 and blood sugar was at 49. She also reported episodes of substernal chest discomfort which would last for 5 minutes and spontaneously resolve. Was found to be orthostatic upon admission and was started on Midodrine 2.5 mg 3 times daily and was continued on Lasix 20mg  BID at the time of discharge. Cyclic troponin values remained negative and her EKG showed no acute ischemic changes.  Cortisol levels were found to be low at 3.7 when checked in the morning hours but this was not addressed prior to discharge.  In talking with the patient today, she reports a variety of symptoms since recent hospital discharge. Reports that she has mostly been consuming peanut butter crackers for most meals as she does not have a good appetite.  Says she is consuming 4 to 5 packs of crackers per day. She does report intermittent nausea. Denies any repeat episodes of chest pain or  dyspnea on exertion. Also reports her lower extremity edema has improved.  She does not weigh herself daily but this has been stable as weight was 305 lbs at the time of hospital discharge on 11/26/2017 and weight is at 305 lbs during today's visit.  Reports home health has been following blood pressure and this has overall been well-controlled. Denies any repeat episodes of hypotension. She did take Midodrine on the first day upon going home and says she developed swelling along her throat and immediately stopped the medication. This is now listed as an allergy in her chart. She was prescribed Lasix 20 mg twice daily at the time of hospital discharge and says that she is taking this intermittently due to already having frequent urination at baseline.  She continues to experience episodes of hypoglycemia with blood sugar in the 60's during the AM hours by her report.  he has refused to see Endocrinology, therefore her Type 2 DM and Hypothyroidism are managed by her PCP.    Past Medical History:  Diagnosis Date  . Anxiety   . CAD (coronary artery disease)    a. s/p DES to LAD and angioplasty to D1 in 05/2017  . CHF (congestive heart failure) (HCC)    Diastolic  . Chronic back pain   . COPD (chronic obstructive pulmonary disease) ([place])   . Degenerative disk disease   . Diabetes mellitus without complication ([place])   . Hypertension   . Hypothyroidism   . On home O2  2.5 L N/C prn  . Pedal edema     Past Surgical History:  Procedure Laterality Date  . CORONARY STENT INTERVENTION N/A 05/22/2017   Procedure: CORONARY STENT INTERVENTION;  Surgeon: Martinique, Peter M, MD;  Location: [facility];  Service: Cardiovascular;  Laterality: N/A;  . HERNIA REPAIR    . RIGHT/LEFT HEART CATH AND CORONARY ANGIOGRAPHY N/A 05/19/2017   Procedure: RIGHT/LEFT HEART CATH AND CORONARY ANGIOGRAPHY;  Surgeon: Leonie Man, MD;  Location: [facility];  Service: Cardiovascular;  Laterality: N/A;  .  TUBAL LIGATION      Current Medications: Outpatient Medications Prior to Visit  Medication Sig Dispense Refill  . albuterol (PROAIR HFA) 108 (90 BASE) MCG/ACT inhaler Inhale 1 puff into the lungs every 4 (four) hours as needed. For shortness of breath (Patient taking differently: Inhale 2 puffs into the lungs every 4 (four) hours as needed. For shortness of breath) 1 Inhaler 1  . albuterol (PROVENTIL) (2.5 MG/3ML) 0.083% nebulizer solution Take 2.5 mg by nebulization 2 (two) times daily as needed for wheezing or shortness of breath.     . ALPRAZolam (XANAX) 1 MG tablet Take 1 mg by mouth 3 (three) times daily as needed for anxiety or sleep.     Marland Kitchen aspirin EC 81 MG tablet Take 81 mg by mouth daily.    Marland Kitchen atorvastatin (LIPITOR) 80 MG tablet Take 1 tablet (80 mg total) by mouth daily at 6 PM. (Patient taking differently: Take 80 mg by mouth every morning. ) 30 tablet 0  . Cholecalciferol (VITAMIN D3) 5000 units CAPS Take 1 capsule (5,000 Units total) by mouth daily. 90 capsule 0  . clopidogrel (PLAVIX) 75 MG tablet Take 1 tablet (75 mg total) by mouth daily. 30 tablet 0  . docusate sodium (COLACE) 100 MG capsule Take 300 mg by mouth daily.    . ferrous sulfate 325 (65 FE) MG tablet Take 1 tablet (325 mg total) by mouth daily with breakfast. 30 tablet 3  . gabapentin (NEURONTIN) 600 MG tablet Take 600 mg by mouth 4 (four) times daily.    . Hydrocortisone (GERHARDT'S BUTT CREAM) CREA Apply 1 application topically 3 (three) times daily. 90 each 0  . insulin aspart (NOVOLOG) 100 UNIT/ML injection Inject 0-9 Units into the skin 3 (three) times daily with meals. (Patient taking differently: Inject 1-18 Units into the skin 3 (three) times daily with meals. Per sliding scale) 10 mL 11  . insulin detemir (LEVEMIR) 100 UNIT/ML injection Inject 0.4 mLs (40 Units total) into the skin at bedtime. 10 mL 0  . levothyroxine (SYNTHROID, LEVOTHROID) 175 MCG tablet Take 1 tablet by mouth daily.    Marland Kitchen nystatin  (MYCOSTATIN/NYSTOP) powder Apply topically 2 (two) times daily. 15 g 0  . Omega-3 Fatty Acids (FISH OIL PO) Take 1 capsule by mouth daily.     . ondansetron (ZOFRAN) 4 MG tablet Take 4 mg by mouth every 8 (eight) hours as needed for vomiting.     Marland Kitchen oxyCODONE-acetaminophen (PERCOCET) 10-325 MG tablet Take 1 tablet by mouth every 6 (six) hours as needed for pain.    . OXYGEN Inhale 2 L into the lungs continuous.    . pantoprazole (PROTONIX) 40 MG tablet Take 1 tablet (40 mg total) by mouth daily. 30 tablet 0  . potassium chloride (MICRO-K) 10 MEQ CR capsule Take 10 mEq by mouth 2 (two) times daily.    . SURE COMFORT INS SYR 1CC/28G 28G X 1/2" 1 ML MISC USE  TO INJECT INSULIN UP TO 4 TIMES DAILY. 150 each 5  . tiZANidine (ZANAFLEX) 4 MG tablet Take 8 mg by mouth 2 (two) times daily.     Marland Kitchen topiramate (TOPAMAX) 25 MG tablet Take 75 mg by mouth daily.     Marland Kitchen umeclidinium-vilanterol (ANORO ELLIPTA) 62.5-25 MCG/INH AEPB Inhale 1 puff into the lungs daily. 30 each 0  . VICTOZA 18 MG/3ML SOPN Inject 1.2 mg into the skin daily.     . furosemide (LASIX) 20 MG tablet Take 20 mg by mouth 2 (two) times daily.    Marland Kitchen levothyroxine (SYNTHROID, LEVOTHROID) 150 MCG tablet Take 1 tablet (150 mcg total) by mouth daily at 6 (six) AM. 30 tablet 2   No facility-administered medications prior to visit.      Allergies:   Dilaudid [hydromorphone hcl]; Midodrine hcl; Actifed cold-allergy [chlorpheniramine-phenylephrine]; Doxycycline; Other; Penicillins; Reglan [metoclopramide]; Valium [diazepam]; and Vistaril [hydroxyzine hcl]   Social History   Socioeconomic History  . Marital status: Widowed    Spouse name: Not on file  . Number of children: Not on file  . Years of education: Not on file  . Highest education level: Not on file  Occupational History  . Not on file  Social Needs  . Financial resource strain: Not on file  . Food insecurity:    Worry: Not on file    Inability: Not on file  . Transportation needs:      Medical: Not on file    Non-medical: Not on file  Tobacco Use  . Smoking status: Former Smoker    Packs/day: 0.25    Types: Cigarettes    Last attempt to quit: 11/24/2014    Years since quitting: 3.0  . Smokeless tobacco: Never Used  Substance and Sexual Activity  . Alcohol use: No    Alcohol/week: 0.0 standard drinks  . Drug use: No  . Sexual activity: Not Currently    Birth control/protection: Surgical  Lifestyle  . Physical activity:    Days per week: Not on file    Minutes per session: Not on file  . Stress: Not on file  Relationships  . Social connections:    Talks on phone: Not on file    Gets together: Not on file    Attends religious service: Not on file    Active member of club or organization: Not on file    Attends meetings of clubs or organizations: Not on file    Relationship status: Not on file  Other Topics Concern  . Not on file  Social History Narrative  . Not on file     Family History:  The patient's family history includes Cerebral palsy in her brother; Diabetes in her brother and other; Heart attack in her father and other; Pneumonia in her brother; Stroke in her mother.   Review of Systems:   Please see the history of present illness.     General:  No chills, fever, night sweats or weight changes.  Cardiovascular:  No chest pain, dyspnea on exertion,  orthopnea, palpitations, paroxysmal nocturnal dyspnea. Positive for edema.  Dermatological: No rash, lesions/masses Respiratory: No cough, dyspnea Urologic: No hematuria, dysuria Abdominal:   No vomiting, diarrhea, bright red blood per rectum, melena, or hematemesis. Positive for nausea.  Neurologic:  No visual changes, wkns, changes in mental status. All other systems reviewed and are otherwise negative except as noted above.   Physical Exam:    VS:  BP (!) 144/60   Pulse 70  Ht 5\' 6"  (1.676 m)   Wt (!) 305 lb (138.3 kg)   SpO2 96% Comment: on room air  BMI 49.23 kg/m    General: Well  developed, morbidly obese Caucasian female appearing in no acute distress. Head: Normocephalic, atraumatic, sclera non-icteric, no xanthomas, nares are without discharge.  Neck: No carotid bruits. JVD not elevated.  Lungs: Respirations regular and unlabored, without wheezes or rales.  Heart: Regular rate and rhythm. No S3 or S4.  No murmur, no rubs, or gallops appreciated. Abdomen: Soft, non-tender, non-distended with normoactive bowel sounds. No hepatomegaly. No rebound/guarding. No obvious abdominal masses. Msk:  Strength and tone appear normal for age. No joint deformities or effusions. Extremities: No clubbing or cyanosis. Chronic edema, improved from prior examinations.  Distal pedal pulses are 2+ bilaterally. Neuro: Alert and oriented X 3. Moves all extremities spontaneously. No focal deficits noted. Psych:  Responds to questions appropriately with a normal affect. Skin: No rashes or lesions noted  Wt Readings from Last 3 Encounters:  12/22/17 (!) 305 lb (138.3 kg)  11/26/17 (!) 305 lb 6.4 oz (138.5 kg)  11/10/17 294 lb (133.4 kg)     Studies/Labs Reviewed:   EKG:  EKG is not ordered today.    Recent Labs: 05/01/2017: B Natriuretic Peptide 108.0 11/24/2017: ALT 10; TSH 34.012 11/25/2017: Hemoglobin 9.1; Platelets 300 11/26/2017: BUN 28; Creatinine, Ser 1.32; Magnesium 2.0; Potassium 4.2; Sodium 138   Lipid Panel    Component Value Date/Time   CHOL 164 05/19/2017 0622   TRIG 104 05/19/2017 0622   HDL 36 (L) 05/19/2017 0622   CHOLHDL 4.6 05/19/2017 0622   VLDL 21 05/19/2017 0622   LDLCALC 107 (H) 05/19/2017 0622    Additional studies/ records that were reviewed today include:   Event Monitor: 09/2017  Sinus rhythm. Average heart rate 72 bpm. Occasional PACs with isolated atrial runs. No significant arrhythmias.  Echocardiogram: 10/24/2017 Study Conclusions  - Left ventricle: The cavity size was normal. Wall thickness was   increased in a pattern of moderate LVH.  Systolic function was   vigorous. The estimated ejection fraction was in the range of 65%   to 70%. Doppler parameters are consistent with abnormal left   ventricular relaxation (grade 1 diastolic dysfunction). Doppler   parameters are consistent with high ventricular filling pressure. - Aortic valve: Mildly calcified annulus. Trileaflet; normal   thickness leaflets. Valve area (VTI): 1.87 cm^2. Valve area   (Vmax): 1.87 cm^2. Valve area (Vmean): 1.9 cm^2. - Mitral valve: Mildly calcified annulus. Mildly thickened leaflets   . There was mild regurgitation. - Left atrium: The atrium was moderately dilated. - Atrial septum: No defect or patent foramen ovale was identified. - Pulmonary arteries: Systolic pressure was moderately increased.   PA peak pressure: 41 mm Hg (S). - Technically adequate study.   Assessment:    1. Syncope, unspecified syncope type   2. Coronary artery disease involving native coronary artery of native heart with other form of angina pectoris ([place])   3. Chronic diastolic CHF (congestive heart failure) ([place])   4. HTN (hypertension), benign   5. Hyperlipidemia LDL goal <70   6. Hypothyroidism, unspecified type   7. CKD (chronic kidney disease), stage III ([place])      Plan:   In order of problems listed above:  1. Recurrent Syncopal Events - The patient has been admitted multiple times for syncopal events which have been thought to be secondary to bradycardia, hypotension, and her severe hypothyroidism. Her TSH was previously elevated  to 199 and had improved to 34 by most recent check. She has been found to have low cortisol levels and I recommended she follow-up with Endocrinology but she declines and she will discuss this further with her PCP. - She was on Midodrine due to episodes of orthostasis but developed an allergic reaction to this during which she says her throat started to swell shut. She has not been started on Florinef due to her chronic lower extremity  edema. With her BP currently at 144/60, would hold off on additional agents at this time. Avoid AV nodal blocking agents given history of symptomatic bradycardia on low-dose Lopressor in the past.   2. CAD - s/p DES to LADand angioplasty to D1in 05/2017. She did report episodes of chest pain during her recent admission and cyclic troponin values were negative and EKG showed no acute ischemic changes. She denies any repeat episodes of chest pain since. - Continue ASA, Plavix, and statin therapy. She has been intolerant to beta-blocker therapy due to baseline bradycardia.  3. Chronic Diastolic CHF - echocardiogram in 10/2017 showed a preserved EF of 65-70% with Grade 1 DD.  She has chronic dyspnea on exertion but denies any recent changes in her respiratory status. She has only been taking Lasix intermittently due to having frequent urination at baseline but on other days takes this twice daily. I recommended that she reduce her current dose of 20 mg twice daily to a standard dose of 20 mg daily to avoid such significant shifts in her fluid levels.   4. HTN - BP is slightly elevated at 144/60 during today's visit. She is no longer on Midodrine due to having an allergic reaction to this. Allowing for slightly permissive hypertension given her labile BP readings and history of orthostasis.  5. HLD - followed by PCP.  Remains on Atorvastatin 80 mg daily.   6. Hypothyroidism/ Low Cortisol Level - TSH improved to 34 by most recent labs on 11/24/2017. AM Cortisol level was found to be low at 3.7 on 11/26/2017 which was checked in part to rule out adrenal insufficiency given her recurrent syncopal events. I recommended that she follow-up with Endocrinology for further evaluation of this but she declines and asked that records be sent to her PCP as he manages her DM and Hypothyroidism. Will forward today's note to Dr. Maudie Mercury.   7. Stage 3 CKD - baseline creatinine 1.4 - 1.5.  Improved to 1.32 at the time  most recent labs on 11/26/2017.   Medication Adjustments/Labs and Tests Ordered: Current medicines are reviewed at length with the patient today.  Concerns regarding medicines are outlined above.  Medication changes, Labs and Tests ordered today are listed in the Patient Instructions below. Patient Instructions  Medication Instructions:   Your physician has recommended you make the following change in your medication:   Decrease furosemide to 20 mg by mouth daily.  Do not take benadryl.  You may take claritin or loratadine 10 mg by mouth daily.  Continue all other medications the same.  Labwork:  NONE  Testing/Procedures:  NONE  Follow-Up:  Your physician recommends that you schedule a follow-up appointment in: keep already scheduled visit with Dr. [name].  Any Other Special Instructions Will Be Listed Below (If Applicable).  If you need a refill on your cardiac medications before your next appointment, please call your pharmacy.    Signed, Erma Heritage, PA-C  12/22/2017 4:24 PM    [address]  [place], [place] [address] Phone: [phone]

## 2017-12-23 ENCOUNTER — Other Ambulatory Visit: Payer: Self-pay | Admitting: *Deleted

## 2017-12-23 NOTE — Patient Outreach (Addendum)
Transition of care call. Pt did not answer her phone today. I left a message to call me back at her convenience.  Eulah Pont. Myrtie Neither, MSN, GNP-BC Gerontological Nurse Practitioner [facility] Care Management [phone]  Pt called me back. She reports she has been having some nausea and stomach cramping which she thinks may be her gallbladder. She has also been taking terrible cramps in her hands and legs. Blood glucose range is normal. No hypotensive or hypoglycemic episodes to report.  I have advised her to call Dr. Maudie Mercury to see if she can get in to be seen for this problems to avoid an ED visit. She said she will. She states, however, that Dr. Maudie Mercury is only in [place] one day a week but his PA or NP is there. I emphasized she needs to see someone and preferably in the office not in the ED.  I will be seeing her in one week.  Eulah Pont. Myrtie Neither, MSN, [facility] Gerontological Nurse Practitioner [facility] Care Management [phone]

## 2017-12-28 ENCOUNTER — Other Ambulatory Visit: Payer: Self-pay | Admitting: *Deleted

## 2017-12-28 NOTE — Patient Outreach (Signed)
[facility]) Care Management  12/28/2017  Stephanie Combs Stephanie Combs [birth year] [id number]  Routine home visit. [name] reports she has had a cardiology appt. Her furosemide was reduced to 20 mg daily only. She does feel like she is a little more SOB than usual. She says it is because it is so hot. She stays in her home with the [facility] on. She only goes out for appts.  BP (!) 122/40   Pulse 93   Resp 18   Wt (!) 305 lb (138.3 kg)   SpO2 94%   BMI 49.23 kg/m FBS at 4 am was 70.(She ate her nabs and went back to bed.)  Heart - irregular  Lungs are clear Extremities - 3+ edema  A:  CHF      COPD       DM  P: Continue current regimen. Reminded Daysi to call me with any problem or issues.      I will continue to see [name] every other week and call her every other week.  Eulah Pont. Myrtie Neither, MSN, [facility] Gerontological Nurse Practitioner [facility] Care Management [phone]

## 2018-01-04 ENCOUNTER — Ambulatory Visit: Payer: Medicare HMO | Admitting: *Deleted

## 2018-01-06 ENCOUNTER — Other Ambulatory Visit: Payer: Self-pay

## 2018-01-06 ENCOUNTER — Encounter (HOSPITAL_COMMUNITY): Payer: Self-pay

## 2018-01-06 ENCOUNTER — Emergency Department (HOSPITAL_COMMUNITY): Payer: Medicare HMO

## 2018-01-06 ENCOUNTER — Inpatient Hospital Stay (HOSPITAL_COMMUNITY)
Admission: EM | Admit: 2018-01-06 | Discharge: 2018-01-11 | DRG: 602 | Disposition: A | Discharge status: Home Health | Payer: Medicare HMO | Attending: Internal Medicine | Admitting: Internal Medicine

## 2018-01-06 DIAGNOSIS — E1122 Type 2 diabetes mellitus with diabetic chronic kidney disease: Secondary | ICD-10-CM | POA: Diagnosis present

## 2018-01-06 DIAGNOSIS — I5033 Acute on chronic diastolic (congestive) heart failure: Secondary | ICD-10-CM | POA: Diagnosis present

## 2018-01-06 DIAGNOSIS — M549 Dorsalgia, unspecified: Secondary | ICD-10-CM | POA: Diagnosis present

## 2018-01-06 DIAGNOSIS — D631 Anemia in chronic kidney disease: Secondary | ICD-10-CM | POA: Diagnosis present

## 2018-01-06 DIAGNOSIS — I2510 Atherosclerotic heart disease of native coronary artery without angina pectoris: Secondary | ICD-10-CM | POA: Diagnosis present

## 2018-01-06 DIAGNOSIS — G8929 Other chronic pain: Secondary | ICD-10-CM | POA: Diagnosis present

## 2018-01-06 DIAGNOSIS — N183 Chronic kidney disease, stage 3 (moderate): Secondary | ICD-10-CM | POA: Diagnosis present

## 2018-01-06 DIAGNOSIS — W19XXXA Unspecified fall, initial encounter: Secondary | ICD-10-CM

## 2018-01-06 DIAGNOSIS — I5032 Chronic diastolic (congestive) heart failure: Secondary | ICD-10-CM | POA: Diagnosis present

## 2018-01-06 DIAGNOSIS — R55 Syncope and collapse: Secondary | ICD-10-CM | POA: Diagnosis present

## 2018-01-06 DIAGNOSIS — Z6841 Body Mass Index (BMI) 40.0 and over, adult: Secondary | ICD-10-CM

## 2018-01-06 DIAGNOSIS — Z91199 Patient's noncompliance with other medical treatment and regimen due to unspecified reason: Secondary | ICD-10-CM

## 2018-01-06 DIAGNOSIS — R110 Nausea: Secondary | ICD-10-CM | POA: Diagnosis present

## 2018-01-06 DIAGNOSIS — Z955 Presence of coronary angioplasty implant and graft: Secondary | ICD-10-CM

## 2018-01-06 DIAGNOSIS — Z9181 History of falling: Secondary | ICD-10-CM

## 2018-01-06 DIAGNOSIS — Z7982 Long term (current) use of aspirin: Secondary | ICD-10-CM

## 2018-01-06 DIAGNOSIS — I130 Hypertensive heart and chronic kidney disease with heart failure and stage 1 through stage 4 chronic kidney disease, or unspecified chronic kidney disease: Secondary | ICD-10-CM | POA: Diagnosis present

## 2018-01-06 DIAGNOSIS — R4182 Altered mental status, unspecified: Secondary | ICD-10-CM | POA: Diagnosis present

## 2018-01-06 DIAGNOSIS — J9611 Chronic respiratory failure with hypoxia: Secondary | ICD-10-CM | POA: Diagnosis present

## 2018-01-06 DIAGNOSIS — Z79890 Hormone replacement therapy (postmenopausal): Secondary | ICD-10-CM

## 2018-01-06 DIAGNOSIS — I959 Hypotension, unspecified: Secondary | ICD-10-CM

## 2018-01-06 DIAGNOSIS — L03115 Cellulitis of right lower limb: Principal | ICD-10-CM | POA: Diagnosis present

## 2018-01-06 DIAGNOSIS — Z823 Family history of stroke: Secondary | ICD-10-CM

## 2018-01-06 DIAGNOSIS — Z9119 Patient's noncompliance with other medical treatment and regimen: Secondary | ICD-10-CM

## 2018-01-06 DIAGNOSIS — Z87891 Personal history of nicotine dependence: Secondary | ICD-10-CM

## 2018-01-06 DIAGNOSIS — Y92009 Unspecified place in unspecified non-institutional (private) residence as the place of occurrence of the external cause: Secondary | ICD-10-CM

## 2018-01-06 DIAGNOSIS — R531 Weakness: Secondary | ICD-10-CM | POA: Diagnosis present

## 2018-01-06 DIAGNOSIS — E039 Hypothyroidism, unspecified: Secondary | ICD-10-CM | POA: Diagnosis present

## 2018-01-06 DIAGNOSIS — L0390 Cellulitis, unspecified: Secondary | ICD-10-CM | POA: Diagnosis present

## 2018-01-06 DIAGNOSIS — Z880 Allergy status to penicillin: Secondary | ICD-10-CM

## 2018-01-06 DIAGNOSIS — Z794 Long term (current) use of insulin: Secondary | ICD-10-CM

## 2018-01-06 DIAGNOSIS — Z7902 Long term (current) use of antithrombotics/antiplatelets: Secondary | ICD-10-CM

## 2018-01-06 DIAGNOSIS — Z888 Allergy status to other drugs, medicaments and biological substances status: Secondary | ICD-10-CM

## 2018-01-06 DIAGNOSIS — Z833 Family history of diabetes mellitus: Secondary | ICD-10-CM

## 2018-01-06 DIAGNOSIS — J449 Chronic obstructive pulmonary disease, unspecified: Secondary | ICD-10-CM | POA: Diagnosis present

## 2018-01-06 DIAGNOSIS — R1084 Generalized abdominal pain: Secondary | ICD-10-CM | POA: Diagnosis present

## 2018-01-06 DIAGNOSIS — F419 Anxiety disorder, unspecified: Secondary | ICD-10-CM | POA: Diagnosis present

## 2018-01-06 DIAGNOSIS — I10 Essential (primary) hypertension: Secondary | ICD-10-CM | POA: Diagnosis present

## 2018-01-06 DIAGNOSIS — Z9981 Dependence on supplemental oxygen: Secondary | ICD-10-CM

## 2018-01-06 DIAGNOSIS — Z8249 Family history of ischemic heart disease and other diseases of the circulatory system: Secondary | ICD-10-CM

## 2018-01-06 HISTORY — DX: Patient's other noncompliance with medication regimen: Z91.14

## 2018-01-06 LAB — COMPREHENSIVE METABOLIC PANEL
ALT: 12 U/L (ref 0–44)
AST: 17 U/L (ref 15–41)
Albumin: 3.4 g/dL — ABNORMAL LOW (ref 3.5–5.0)
Alkaline Phosphatase: 89 U/L (ref 38–126)
Anion gap: 5 (ref 5–15)
BUN: 32 mg/dL — ABNORMAL HIGH (ref 8–23)
CO2: 26 mmol/L (ref 22–32)
Calcium: 8.6 mg/dL — ABNORMAL LOW (ref 8.9–10.3)
Chloride: 107 mmol/L (ref 98–111)
Creatinine, Ser: 1.65 mg/dL — ABNORMAL HIGH (ref 0.44–1.00)
GFR calc Af Amer: 36 mL/min — ABNORMAL LOW (ref >60)
GFR calc non Af Amer: 31 mL/min — ABNORMAL LOW (ref >60)
Glucose, Bld: 226 mg/dL — ABNORMAL HIGH (ref 70–99)
Potassium: 5.1 mmol/L (ref 3.5–5.1)
Sodium: 138 mmol/L (ref 135–145)
Total Bilirubin: 0.4 mg/dL (ref 0.3–1.2)
Total Protein: 6.4 g/dL — ABNORMAL LOW (ref 6.5–8.1)

## 2018-01-06 LAB — CBC WITH DIFFERENTIAL/PLATELET
Basophils Absolute: 0.1 10*3/uL (ref 0.0–0.1)
Basophils Relative: 2 %
Eosinophils Absolute: 0.3 10*3/uL (ref 0.0–0.7)
Eosinophils Relative: 5 %
HCT: 29.6 % — ABNORMAL LOW (ref 36.0–46.0)
Hemoglobin: 8.8 g/dL — ABNORMAL LOW (ref 12.0–15.0)
Lymphocytes Relative: 23 %
Lymphs Abs: 1.3 10*3/uL (ref 0.7–4.0)
MCH: 26.9 pg (ref 26.0–34.0)
MCHC: 29.7 g/dL — ABNORMAL LOW (ref 30.0–36.0)
MCV: 90.5 fL (ref 78.0–100.0)
Monocytes Absolute: 0.5 10*3/uL (ref 0.1–1.0)
Monocytes Relative: 10 %
Neutro Abs: 3.3 10*3/uL (ref 1.7–7.7)
Neutrophils Relative %: 60 %
Platelets: 300 10*3/uL (ref 150–400)
RBC: 3.27 MIL/uL — ABNORMAL LOW (ref 3.87–5.11)
RDW: 15.1 % (ref 11.5–15.5)
WBC: 5.5 10*3/uL (ref 4.0–10.5)

## 2018-01-06 LAB — URINALYSIS, ROUTINE W REFLEX MICROSCOPIC
Bilirubin Urine: NEGATIVE
Glucose, UA: NEGATIVE mg/dL
Hgb urine dipstick: NEGATIVE
Ketones, ur: NEGATIVE mg/dL
Leukocytes, UA: NEGATIVE
Nitrite: NEGATIVE
Protein, ur: NEGATIVE mg/dL
Specific Gravity, Urine: 1.019 (ref 1.005–1.030)
pH: 5 (ref 5.0–8.0)

## 2018-01-06 LAB — CBG MONITORING, ED: Glucose-Capillary: 232 mg/dL — ABNORMAL HIGH (ref 70–99)

## 2018-01-06 LAB — PROTIME-INR
INR: 0.98
Prothrombin Time: 12.9 seconds (ref 11.4–15.2)

## 2018-01-06 LAB — ETHANOL: Alcohol, Ethyl (B): <10 mg/dL (ref <10)

## 2018-01-06 LAB — LACTIC ACID, PLASMA
Lactic Acid, Venous: 1.2 mmol/L (ref 0.5–1.9)
Lactic Acid, Venous: 0.8 mmol/L (ref 0.5–1.9)

## 2018-01-06 LAB — RAPID URINE DRUG SCREEN, HOSP PERFORMED
Amphetamines: NOT DETECTED
Barbiturates: NOT DETECTED
Benzodiazepines: POSITIVE — AB
Cocaine: NOT DETECTED
Opiates: NOT DETECTED
Tetrahydrocannabinol: NOT DETECTED

## 2018-01-06 LAB — TROPONIN I: Troponin I: <0.03 ng/mL (ref <0.03)

## 2018-01-06 LAB — ACETAMINOPHEN LEVEL: Acetaminophen (Tylenol), Serum: <10 ug/mL — ABNORMAL LOW (ref 10–30)

## 2018-01-06 LAB — BRAIN NATRIURETIC PEPTIDE: B Natriuretic Peptide: 257 pg/mL — ABNORMAL HIGH (ref 0.0–100.0)

## 2018-01-06 LAB — SALICYLATE LEVEL: Salicylate Lvl: <7 mg/dL (ref 2.8–30.0)

## 2018-01-06 MED ORDER — SODIUM CHLORIDE 0.45 % IV SOLN: INTRAVENOUS | Status: DC

## 2018-01-06 MED ORDER — ALBUTEROL SULFATE (2.5 MG/3ML) 0.083% IN NEBU: 2.5000 mg | INHALATION_SOLUTION | RESPIRATORY_TRACT | Status: DC | PRN

## 2018-01-06 MED ORDER — ONDANSETRON HCL 4 MG PO TABS: 4.0000 mg | ORAL_TABLET | Freq: Four times a day (QID) | ORAL | Status: DC | PRN

## 2018-01-06 MED ORDER — ACETAMINOPHEN 325 MG PO TABS: 650.0000 mg | ORAL_TABLET | Freq: Four times a day (QID) | ORAL | Status: DC | PRN

## 2018-01-06 MED ORDER — SODIUM CHLORIDE 0.45 % IV SOLN
INTRAVENOUS | Status: DC
  Administered 2018-01-07: 01:00:00 via INTRAVENOUS

## 2018-01-06 MED ORDER — ACETAMINOPHEN 650 MG RE SUPP: 650.0000 mg | Freq: Four times a day (QID) | RECTAL | Status: DC | PRN

## 2018-01-06 MED ORDER — SODIUM CHLORIDE 0.9 % IV SOLN
INTRAVENOUS | Status: DC
  Administered 2018-01-06 – 2018-01-07 (×2): via INTRAVENOUS

## 2018-01-06 MED ORDER — HEPARIN SODIUM (PORCINE) 5000 UNIT/ML IJ SOLN
5000.0000 [IU] | Freq: Three times a day (TID) | INTRAMUSCULAR | Status: DC
  Administered 2018-01-06 – 2018-01-11 (×13): 5000 [IU] via SUBCUTANEOUS
  Filled 2018-01-06 (×14): qty 1

## 2018-01-06 MED ORDER — ONDANSETRON HCL 4 MG/2ML IJ SOLN
4.0000 mg | Freq: Four times a day (QID) | INTRAMUSCULAR | Status: DC | PRN
  Administered 2018-01-09 – 2018-01-11 (×7): 4 mg via INTRAVENOUS
  Filled 2018-01-06 (×7): qty 2

## 2018-01-06 MED ORDER — SODIUM CHLORIDE 0.45 % IV BOLUS
1000.0000 mL | Freq: Once | INTRAVENOUS | Status: AC
  Administered 2018-01-06: 1000 mL via INTRAVENOUS

## 2018-01-06 NOTE — H&P (Signed)
History and Physical    Stephanie Combs TXM:[id number] DOB: 01-10-1951 DOA: 01/06/2018  PCP: [name], MD   Patient coming from: Home.  I have personally briefly reviewed patient's old medical records in [place]  Chief Complaint: AMS.  HPI: Stephanie Combs is a 67 y.o. female with medical history significant of anxiety, history of CAD (s/p DES to LAD and angioplasty to D1 in 05/2017), chronic diastolic CHF, chronic back pain, history of degenerative disc disease, COPD, type 2 diabetes, hypertension, hypothyroidism, chronic pedal edema, morbid obesity, chronic anemia, recurrent syncopal episodes with several admissions this year due to this who is coming to the emergency department after passing out at home followed by a period of confusion.  The patient stated that she does not remember seeing the EMS crew, having dizziness, chest pain, palpitations, diaphoresis or dyspnea prior to these symptoms.  She denies fever, but feels chills.  Her appetite is decreased.  Denies productive cough, wheezing or hemoptysis.  She denies abdominal pain, nausea, emesis, diarrhea, but complains of severe constipation.  Denies dysuria, frequency or hematuria.  No polyuria, polydipsia, polyphagia or blurred vision.  ED Course: Initial vital signs temperature 98.3 F, pulse 60, respirations 17, blood pressure 94/43 mmHg and O2 sat 96% on room air.  Her urinalysis was normal.  CBC showed a white count of 5.5, hemoglobin 8.8 g/dL and platelets 300.  PT was 12.9 seconds and INR 0.98.  Troponin was less than 0.03 ng/mL.BNP was 257.0 pg/mL.  CMP shows normal electrolytes, BUN of 32, creatinine 1.65 and glucose 226 mg/dL.  LFTs show total protein of 6.4 and albumin of 3.4 g/dL, the rest of the LFTs are within normal limits.  Lactic acid was 1.2 mmol/L.  Acetaminophen, Advil alcohol and salicylate levels were within normal parameters.  CT head did not show any acute abnormalities.  CT lumbar spine showed  multiple levels with DDD and concentric moderate disc bulge at the L2-3 level.  Please see images and full radiology report for further details.  Review of Systems: As per HPI otherwise 10 point review of systems negative.   Past Medical History:  Diagnosis Date  . Anxiety   . CAD (coronary artery disease)    a. s/p DES to LAD and angioplasty to D1 in 05/2017  . CHF (congestive heart failure) (HCC)    Diastolic  . Chronic back pain   . COPD (chronic obstructive pulmonary disease) ([place])   . Degenerative disk disease   . Diabetes mellitus without complication ([place])   . History of medication noncompliance 11/2017   "the patient frequently self-adjusts medications without notifying her physicians"  . Hypertension   . Hypothyroidism   . On home O2    2.5 L N/C prn  . Pedal edema     Past Surgical History:  Procedure Laterality Date  . CORONARY STENT INTERVENTION N/A 05/22/2017   Procedure: CORONARY STENT INTERVENTION;  Surgeon: Martinique, Peter M, MD;  Location: [facility];  Service: Cardiovascular;  Laterality: N/A;  . HERNIA REPAIR    . RIGHT/LEFT HEART CATH AND CORONARY ANGIOGRAPHY N/A 05/19/2017   Procedure: RIGHT/LEFT HEART CATH AND CORONARY ANGIOGRAPHY;  Surgeon: Leonie Man, MD;  Location: [facility];  Service: Cardiovascular;  Laterality: N/A;  . TUBAL LIGATION       reports that she quit smoking about 3 years ago. Her smoking use included cigarettes. She smoked 0.25 packs per day. She has  never used smokeless tobacco. She reports that she does not drink alcohol or use drugs.  Allergies  Allergen Reactions  . Dilaudid [Hydromorphone Hcl] Itching  . Midodrine Hcl Swelling    After one dose had anaphylactic reaction, had to call EMS.  Marland Kitchen Actifed Cold-Allergy [Chlorpheniramine-Phenylephrine]     "I was sick and red and it didn't agree with me at all"  . Doxycycline Nausea And Vomiting  . Other Cough    Pt states she is allergic to ragweed and that she starts  coughing and sneezing like crazy  . Penicillins Hives and Itching    Tolerates Rocephin Has patient had a PCN reaction causing immediate rash, facial/tongue/throat swelling, SOB or lightheadedness with hypotension: Yes Has patient had a PCN reaction causing severe rash involving mucus membranes or skin necrosis: No Has patient had a PCN reaction that required hospitalization Yes Has patient had a PCN reaction occurring within the last 10 years: No If all of the above answers are "NO", then may proceed with Cephalosporin use.   . Reglan [Metoclopramide] Itching  . Valium [Diazepam] Itching  . Vistaril [Hydroxyzine Hcl] Itching    Family History  Problem Relation Age of Onset  . Stroke Mother   . Heart attack Father   . Diabetes Brother   . Cerebral palsy Brother   . Pneumonia Brother   . Diabetes Other   . Heart attack Other     Prior to Admission medications   Medication Sig Start Date End Date Taking? Authorizing Provider  albuterol (PROAIR HFA) 108 (90 BASE) MCG/ACT inhaler Inhale 1 puff into the lungs every 4 (four) hours as needed. For shortness of breath Patient taking differently: Inhale 2 puffs into the lungs every 4 (four) hours as needed. For shortness of breath 05/30/13   Kinnie Feil, MD  albuterol (PROVENTIL) (2.5 MG/3ML) 0.083% nebulizer solution Take 2.5 mg by nebulization 2 (two) times daily as needed for wheezing or shortness of breath.     [provider]  ALPRAZolam Duanne Moron) 1 MG tablet Take 1 mg by mouth 3 (three) times daily as needed for anxiety or sleep.  01/05/17   [provider]  aspirin EC 81 MG tablet Take 81 mg by mouth daily.    [provider]  atorvastatin (LIPITOR) 80 MG tablet Take 1 tablet (80 mg total) by mouth daily at 6 PM. Patient taking differently: Take 80 mg by mouth every morning.  05/26/17   [name], MD  Cholecalciferol (VITAMIN D3) 5000 units CAPS Take 1 capsule (5,000 Units total) by mouth daily. 08/19/16    Cassandria Anger, MD  clopidogrel (PLAVIX) 75 MG tablet Take 1 tablet (75 mg total) by mouth daily. 05/26/17   [name], MD  docusate sodium (COLACE) 100 MG capsule Take 300 mg by mouth daily.    [provider]  ferrous sulfate 325 (65 FE) MG tablet Take 1 tablet (325 mg total) by mouth daily with breakfast. 02/05/17   [name], MD  furosemide (LASIX) 20 MG tablet Take 1 tablet (20 mg total) by mouth daily. 12/22/17   Combs, Fransisco Hertz, PA-C  gabapentin (NEURONTIN) 600 MG tablet Take 600 mg by mouth 4 (four) times daily.    [provider]  Hydrocortisone (GERHARDT'S BUTT CREAM) CREA Apply 1 application topically 3 (three) times daily. 05/26/17   [name], MD  insulin aspart (NOVOLOG) 100 UNIT/ML injection Inject 0-9 Units into the skin 3 (three) times daily with meals. Patient taking differently: Inject  1-18 Units into the skin 3 (three) times daily with meals. Per sliding scale 05/26/17   [name], MD  insulin detemir (LEVEMIR) 100 UNIT/ML injection Inject 0.4 mLs (40 Units total) into the skin at bedtime. 11/26/17   Orson Eva, MD  levothyroxine (SYNTHROID, LEVOTHROID) 175 MCG tablet Take 1 tablet by mouth daily. 12/15/17   [provider]  nystatin (MYCOSTATIN/NYSTOP) powder Apply topically 2 (two) times daily. 10/10/16   Eber Jones, MD  Omega-3 Fatty Acids (FISH OIL PO) Take 1 capsule by mouth daily.     [provider]  ondansetron (ZOFRAN) 4 MG tablet Take 4 mg by mouth every 8 (eight) hours as needed for vomiting.  01/01/17   [provider]  oxyCODONE-acetaminophen (PERCOCET) 10-325 MG tablet Take 1 tablet by mouth every 6 (six) hours as needed for pain.    [provider]  OXYGEN Inhale 2 L into the lungs continuous.    [provider]  pantoprazole (PROTONIX) 40 MG tablet Take 1 tablet (40 mg total) by mouth daily. 02/05/17   [name], MD  potassium chloride (MICRO-K) 10 MEQ CR capsule Take 10 mEq by mouth 2  (two) times daily.    [provider]  SURE COMFORT INS SYR 1CC/28G 28G X 1/2" 1 ML MISC USE TO INJECT INSULIN UP TO 4 TIMES DAILY. 03/06/17   Cassandria Anger, MD  tiZANidine (ZANAFLEX) 4 MG tablet Take 8 mg by mouth 2 (two) times daily.  01/10/17   [provider]  topiramate (TOPAMAX) 25 MG tablet Take 75 mg by mouth daily.  01/02/17   [provider]  umeclidinium-vilanterol (ANORO ELLIPTA) 62.5-25 MCG/INH AEPB Inhale 1 puff into the lungs daily. 05/26/17   [name], MD  VICTOZA 18 MG/3ML SOPN Inject 1.2 mg into the skin daily.  01/01/17   [provider]    Physical Exam: Vitals:   01/06/18 1732 01/06/18 1818 01/06/18 2030 01/06/18 2055  BP: (!) 94/43  (!) 137/53   Pulse: 76  75 67  Resp: 18 18 18 17   Temp: 98.3 F (36.8 C) 98.2 F (36.8 C)    TempSrc: Oral Rectal    SpO2: 93%  96% 100%  Weight:      Height:        Constitutional: NAD, calm, comfortable Eyes: PERRL, lids and conjunctivae normal ENMT: Mucous membranes are moist. Posterior pharynx clear of any exudate or Neck: normal, supple, no masses, no thyromegaly Respiratory: Clear to auscultation bilaterally, no wheezing, no crackles. Normal respiratory effort. No accessory muscle use.  Cardiovascular: Regular rate and rhythm, no murmurs / rubs / gallops.  Stage III lymphedema with elephantiasis skin changes.  No significant pitting edema. 2+ pedal pulses. No carotid bruits.  Abdomen: Obese, soft, no tenderness, no masses palpated. No hepatosplenomegaly. Bowel sounds positive.  Musculoskeletal: no clubbing / cyanosis. Good ROM, no contractures. Normal muscle tone.  Skin: Positive erythema, calor, mild edema and tenderness on right lower extremity pretibial, medial and posterior aspect.  Elephantiasis skin changes on feet.  Please see pictures below. Neurologic: CN 2-12 grossly intact. Sensation intact, DTR normal. Strength 5/5 in all 4.  Psychiatric: Normal judgment and insight. Alert  and oriented x 3. Normal mood.       Labs on Admission: I have personally reviewed following labs and imaging studies  CBC: Recent Labs  Lab 01/06/18 1820  WBC 5.5  NEUTROABS 3.3  HGB 8.8*  HCT 29.6*  MCV 90.5  PLT 300  Basic Metabolic Panel: Recent Labs  Lab 01/06/18 1820  NA 138  K 5.1  CL 107  CO2 26  GLUCOSE 226*  BUN 32*  CREATININE 1.65*  CALCIUM 8.6*   GFR: Estimated Creatinine Clearance: 45.2 mL/min (A) (by C-G formula based on SCr of 1.65 mg/dL (H)). Liver Function Tests: Recent Labs  Lab 01/06/18 1820  AST 17  ALT 12  ALKPHOS 89  BILITOT 0.4  PROT 6.4*  ALBUMIN 3.4*   No results for input(s): LIPASE, AMYLASE in the last 168 hours. No results for input(s): AMMONIA in the last 168 hours. Coagulation Profile: Recent Labs  Lab 01/06/18 1820  INR 0.98   Cardiac Enzymes: Recent Labs  Lab 01/06/18 1820  TROPONINI <0.03   BNP (last 3 results) No results for input(s): PROBNP in the last 8760 hours. HbA1C: No results for input(s): HGBA1C in the last 72 hours. CBG: Recent Labs  Lab 01/06/18 1730  GLUCAP 232*   Lipid Profile: No results for input(s): CHOL, HDL, LDLCALC, TRIG, CHOLHDL, LDLDIRECT in the last 72 hours. Thyroid Function Tests: No results for input(s): TSH, T4TOTAL, FREET4, T3FREE, THYROIDAB in the last 72 hours. Anemia Panel: No results for input(s): VITAMINB12, FOLATE, FERRITIN, TIBC, IRON, RETICCTPCT in the last 72 hours. Urine analysis:    Component Value Date/Time   COLORURINE YELLOW 01/06/2018 [place] 01/06/2018 1820   LABSPEC 1.019 01/06/2018 1820   PHURINE 5.0 01/06/2018 1820   GLUCOSEU NEGATIVE 01/06/2018 1820   HGBUR NEGATIVE 01/06/2018 1820   BILIRUBINUR NEGATIVE 01/06/2018 1820   KETONESUR NEGATIVE 01/06/2018 1820   PROTEINUR NEGATIVE 01/06/2018 1820   UROBILINOGEN 0.2 12/04/2014 0650   NITRITE NEGATIVE 01/06/2018 1820   LEUKOCYTESUR NEGATIVE 01/06/2018 1820    Radiological Exams on  Admission: Dg Chest 2 View  Result Date: 01/06/2018 CLINICAL DATA:  67 year old with recent progressively worsening generalized weakness and falls. EXAM: CHEST - 2 VIEW COMPARISON:  11/24/2017, 10/15/2017 and earlier. FINDINGS: AP ERECT and LATERAL images were obtained. Cardiac silhouette upper normal in size to slightly enlarged for AP technique, unchanged. Thoracic aorta mildly atherosclerotic, unchanged. Hilar and mediastinal contours otherwise unremarkable. Linear atelectasis in the RIGHT LOWER LOBE and RIGHT MIDDLE LOBE. Lungs otherwise clear. No localized airspace consolidation. No pleural effusions. No pneumothorax. Normal pulmonary vascularity. Degenerative changes and DISH involving the thoracic spine. IMPRESSION: Mild linear atelectasis involving the RIGHT LOWER LOBE and RIGHT MIDDLE LOBE. No acute cardiopulmonary disease otherwise. Electronically Signed   By: Evangeline Dakin M.D.   On: 01/06/2018 19:07   Ct Head Wo Contrast  Result Date: 01/06/2018 CLINICAL DATA:  Altered mental status, weakness and disoriented. Slid from chair to ground. Leg numbness. History of hypertension and diabetes. EXAM: CT HEAD WITHOUT CONTRAST TECHNIQUE: Contiguous axial images were obtained from the base of the skull through the vertex without intravenous contrast. COMPARISON:  CT HEAD October 13, 2017 FINDINGS: Moderate motion degraded examination. BRAIN: No intraparenchymal hemorrhage, mass effect nor midline shift. The ventricles and sulci are normal for age. Patchy to confluent supratentorial white matter hypodensities. No acute large vascular territory infarcts. No abnormal extra-axial fluid collections. Basal cisterns are patent. VASCULAR: Mild calcific atherosclerosis of the carotid siphons. SKULL: No skull fracture. No significant scalp soft tissue swelling. SINUSES/ORBITS: Paranasal sinuses are well aerated. Minimal RIGHT mastoid effusion.The included ocular globes and orbital contents are non-suspicious. OTHER:  None. IMPRESSION: 1. Moderate motion degraded examination. No acute intracranial process. 2. Moderate to severe chronic small vessel ischemic changes. Electronically Signed  By: Elon Alas M.D.   On: 01/06/2018 19:00   Ct Lumbar Spine Wo Contrast  Result Date: 01/06/2018 CLINICAL DATA:  Chronic back pain after fall. Decreased responsiveness with fatigue and weakness. EXAM: CT LUMBAR SPINE WITHOUT CONTRAST TECHNIQUE: Multidetector CT imaging of the lumbar spine was performed without intravenous contrast administration. Multiplanar CT image reconstructions were also generated. COMPARISON:  None. FINDINGS: Segmentation: There are 5 non ribbed lumbar vertebrae. Alignment: Maintained lumbar lordosis without pars defects or listhesis. Vertebrae: Osteopenic appearance of the included lower thoracic and lumbar spine without acute fracture or suspicious osseous lesions. 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 Paraspinal and other soft tissues: Moderate aortoiliac atherosclerosis. Nonobstructing left lower pole renal calculus measuring 5 mm. No adenopathy nor abnormal fluid collections. Disc levels: T11-T12 degenerative disc disease with vacuum disc phenomenon and bilateral facet arthropathy, left greater than right. No focal disc herniation or significant foraminal encroachment. T12-L1: No focal disc herniation or significant foraminal encroachment. Minimal facet arthropathy bilaterally. L1-L2: Mild disc bulge and facet hypertrophy. Slight thickening of the ligamentum flavum bilaterally. Mild prominence of posterior epidural fat contributing to mild central canal stenosis. No significant foraminal encroachment. L2-L3: Degenerative disc disease with vacuum disc phenomenon, facet hypertrophy, moderate disc bulge and ligamentum flavum hypertrophy contributing to mild central canal and moderate right foraminal stenosis. Slight left foraminal stenosis. L3-L4: Mild concentric disc bulge with facet  hypertrophy. Mild foraminal stenosis. L4-L5: Advanced degenerative disc disease with marked disc flattening and vacuum disc phenomenon, facet hypertrophy and concentric disc bulge contributing to mild foraminal stenosis. No significant central canal stenosis. L5-S1: Degenerative disc disease with moderate disc flattening, facet hypertrophy but without focal disc herniation. No significant foraminal encroachment. IMPRESSION: 1. 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 2. Concentric moderate disc bulge, facet hypertrophy and ligamentum flavum hypertrophy at L2-3 contributing to mild central canal stenosis with moderate right and mild left foraminal stenosis. Lesser degrees of concentric disc bulging noted at L3-4 at L4-5. 3. No acute nor aggressive osseous abnormality. 4. Aortoiliac atherosclerosis without aneurysm. 5. 5 mm nonobstructing left lower pole renal calculus. Electronically Signed   By: Ashley Royalty M.D.   On: 01/06/2018 19:10    EKG: Independently reviewed. Vent. rate 86 BPM PR interval * ms QRS duration 91 ms QT/QTc 395/473 ms P-R-T axes 58 65 42 Sinus rhythm Atrial premature complex Low voltage, precordial leads  Assessment/Plan Principal Problem:   Altered mental status Likely due to syncopal episode. This has been worked up earlier this year. Consider cardiology evaluation if appropriate. The patient may benefit from event recorder placement.  Active Problems:   COPD (chronic obstructive pulmonary disease) (HCC)   Chronic respiratory failure with hypoxia (HCC) Supplemental oxygen and bronchodilators as needed. I suspect sleep apnea may be a consideration. Outpatient sleep study if not done before.    Morbid obesity ([place]) Needs significant lifestyle modifications to avoid complications.    HTN (hypertension), benign Only taking furosemide with potassium supplementation at this time. Monitor blood pressure, renal  function electrolytes.    Chronic diastolic CHF (congestive heart failure) (HCC) Not on beta-blocker, ARB or ACE inhibitor. No signs of decompensation at this time. Continue furosemide as needed. Potassium supplementation.    Hypothyroidism Continue levothyroxine 175 mcg p.o. daily.    Uncontrolled type 2 diabetes mellitus with stage 3 chronic kidney disease (HCC) Last hemoglobin A1c was 8.1% in 10/13/2017. Carbohydrate modified diet. Continue Levemir 40 units SQ at bedtime. CBG  monitoring regular insulin sliding scale. Continue with [place] injections.    Personal history of noncompliance with medical treatment, presenting hazards to health This seems to be a chronic issue.  Per patient, she has recently been compliant with her treatment regimen.  However, the patient needs significant lifestyle modifications to avoid further complications of uncontrolled diabetes, hypertension, etc.    Anxiety Continue alprazolam as needed.    DVT prophylaxis: SQ heparin. Code Status: Full code. Family Communication: None at bedside. Disposition Plan: Observation for AMS and PT evaluation in a.m.  once MS at baseline. Consults called: Physical therapy consult in the morning.  Case management. Admission status: Observation/telemetry.   Reubin Milan MD [facility] Pager [phone].  If 7PM-7AM, please contact night-coverage [email or web address] Password [facility]  01/06/2018, 9:03 PM

## 2018-01-06 NOTE — ED Triage Notes (Signed)
EMS reports pt's physical therapist was at her house and noticed pt had decreased responsiveness.  EMS says pt was alert but tired and weak when they arrived.  Pt oriented.  Pt initially didn't want to go with ems to the hospital because she said she was just tired because she didn't sleep last night and took some meds today that make her drowsy.  Pt says she usually sleeps during the day.  EMS says pt refused all pt care and left.  Pt called ems back because pt slid out of her chair and her friend couldn't get her up out of the floor.  EMS says pt told them she couldn't get up because her legs were numb and then told ems she had not taken any meds today.  cbg 229 with ems.

## 2018-01-06 NOTE — ED Notes (Signed)
Lactic not drawn at 1800 because patient was not in room. Pt was at CT

## 2018-01-06 NOTE — ED Provider Notes (Signed)
[facility] EMERGENCY DEPARTMENT Provider Note   CSN: [id number] Arrival date & time: 01/06/18  1722     History   Chief Complaint Chief Complaint  Patient presents with  . Weakness    HPI Stephanie Combs is a 67 y.o. female.   Weakness     Pt was seen at 1805. Per EMS and pt report: EMS states pt's PT was at her home and noted pt had "decreased responsiveness." EMS states pt was "tired and weak" on their arrival to scene. Pt refused transport to the ED at that time. EMS was called back because she was too weak to stand and slid out of her chair to the floor. Pt initially told EMS she took meds to make her drowsy today, but then denied this. Pt states her legs (knees to feet bilat) intermittently "get numb" with most recent episode starting yesterday. Pt also states "they told her" (unclear who they is) that her RLE was "red." Denies CP/SOB, no cough, no abd pain, no N/V/D, no focal motor weakness, no syncope.    Past Medical History:  Diagnosis Date  . Anxiety   . CAD (coronary artery disease)    a. s/p DES to LAD and angioplasty to D1 in 05/2017  . CHF (congestive heart failure) (HCC)    Diastolic  . Chronic back pain   . COPD (chronic obstructive pulmonary disease) ([place])   . Degenerative disk disease   . Diabetes mellitus without complication ([place])   . History of medication noncompliance   . Hypertension   . Hypothyroidism   . On home O2    2.5 L N/C prn  . Pedal edema     Patient Active Problem List   Diagnosis Date Noted  . Type 2 diabetes mellitus with hypoglycemia without coma ([place]) 11/24/2017  . Anxiety 11/24/2017  . Chronic respiratory failure with hypoxia ([place]) 11/24/2017  . Syncope 10/24/2017  . Bradycardia   . Syncope due to orthostatic hypotension 09/04/2017  . Obesity, Class III, BMI 40-49.9 (morbid obesity) ([place]) 09/04/2017  . Abnormal nuclear stress test 05/19/2017  . Atypical angina ([place]) 05/19/2017  . Edema 05/15/2017  . Skin ulcer ([place])  01/30/2017  . Tinea cruris 10/10/2016  . Vitamin D deficiency 08/19/2016  . Personal history of noncompliance with medical treatment, presenting hazards to health 06/17/2016  . CAP (community acquired pneumonia) 07/25/2015  . Pressure ulcer 05/02/2015  . Hyponatremia 05/01/2015  . Acute-on-chronic kidney injury ([place]) 05/01/2015  . Uncontrolled type 2 diabetes mellitus with stage 3 chronic kidney disease ([place]) 05/01/2015  . Cellulitis of foot, right 03/24/2015  . Peripheral edema 12/04/2014  . Acute renal failure superimposed on stage 3 chronic kidney disease ([place]) 11/24/2014  . Bilateral edema of lower extremity   . Diabetic ulcer of right great toe ([place])   . Foot ulcer due to secondary DM ([place]) 11/07/2014  . Diabetic ulcer of right foot ([place]) 11/07/2014  . Edema of lower extremity 05/27/2013  . CKD (chronic kidney disease), stage III ([place]) 04/14/2013  . Anemia in CKD (chronic kidney disease) 04/14/2013  . Chest pain 04/14/2013  . Dyspnea 04/14/2013  . Chronic diastolic CHF (congestive heart failure) ([place])   . Hypothyroidism   . Hypoglycemia 08/07/2012  . COPD (chronic obstructive pulmonary disease) ([place]) 08/07/2012  . Morbid obesity ([place]) 08/07/2012  . DM type 2 (diabetes mellitus, type 2) ([place]) 08/07/2012  . HTN (hypertension), benign 08/07/2012    Past Surgical History:  Procedure Laterality Date  . CORONARY STENT  INTERVENTION N/A 05/22/2017   Procedure: CORONARY STENT INTERVENTION;  Surgeon: Martinique, Peter M, MD;  Location: [facility];  Service: Cardiovascular;  Laterality: N/A;  . HERNIA REPAIR    . RIGHT/LEFT HEART CATH AND CORONARY ANGIOGRAPHY N/A 05/19/2017   Procedure: RIGHT/LEFT HEART CATH AND CORONARY ANGIOGRAPHY;  Surgeon: Leonie Man, MD;  Location: [facility];  Service: Cardiovascular;  Laterality: N/A;  . TUBAL LIGATION       OB History   None      Home Medications    Prior to Admission medications   Medication Sig Start Date End Date  Taking? Authorizing Provider  albuterol (PROAIR HFA) 108 (90 BASE) MCG/ACT inhaler Inhale 1 puff into the lungs every 4 (four) hours as needed. For shortness of breath Patient taking differently: Inhale 2 puffs into the lungs every 4 (four) hours as needed. For shortness of breath 05/30/13   Kinnie Feil, MD  albuterol (PROVENTIL) (2.5 MG/3ML) 0.083% nebulizer solution Take 2.5 mg by nebulization 2 (two) times daily as needed for wheezing or shortness of breath.     [provider]  ALPRAZolam Duanne Moron) 1 MG tablet Take 1 mg by mouth 3 (three) times daily as needed for anxiety or sleep.  01/05/17   [provider]  aspirin EC 81 MG tablet Take 81 mg by mouth daily.    [provider]  atorvastatin (LIPITOR) 80 MG tablet Take 1 tablet (80 mg total) by mouth daily at 6 PM. Patient taking differently: Take 80 mg by mouth every morning.  05/26/17   [name], MD  Cholecalciferol (VITAMIN D3) 5000 units CAPS Take 1 capsule (5,000 Units total) by mouth daily. 08/19/16   Cassandria Anger, MD  clopidogrel (PLAVIX) 75 MG tablet Take 1 tablet (75 mg total) by mouth daily. 05/26/17   [name], MD  docusate sodium (COLACE) 100 MG capsule Take 300 mg by mouth daily.    [provider]  ferrous sulfate 325 (65 FE) MG tablet Take 1 tablet (325 mg total) by mouth daily with breakfast. 02/05/17   [name], MD  furosemide (LASIX) 20 MG tablet Take 1 tablet (20 mg total) by mouth daily. 12/22/17   Strader, Fransisco Hertz, PA-C  gabapentin (NEURONTIN) 600 MG tablet Take 600 mg by mouth 4 (four) times daily.    [provider]  Hydrocortisone (GERHARDT'S BUTT CREAM) CREA Apply 1 application topically 3 (three) times daily. 05/26/17   [name], MD  insulin aspart (NOVOLOG) 100 UNIT/ML injection Inject 0-9 Units into the skin 3 (three) times daily with meals. Patient taking differently: Inject 1-18 Units into the skin 3 (three) times daily with meals. Per sliding scale  05/26/17   [name], MD  insulin detemir (LEVEMIR) 100 UNIT/ML injection Inject 0.4 mLs (40 Units total) into the skin at bedtime. 11/26/17   Orson Eva, MD  levothyroxine (SYNTHROID, LEVOTHROID) 175 MCG tablet Take 1 tablet by mouth daily. 12/15/17   [provider]  nystatin (MYCOSTATIN/NYSTOP) powder Apply topically 2 (two) times daily. 10/10/16   Eber Jones, MD  Omega-3 Fatty Acids (FISH OIL PO) Take 1 capsule by mouth daily.     [provider]  ondansetron (ZOFRAN) 4 MG tablet Take 4 mg by mouth every 8 (eight) hours as needed for vomiting.  01/01/17   [provider]  oxyCODONE-acetaminophen (PERCOCET) 10-325 MG tablet Take 1 tablet by mouth every 6 (six) hours as needed for pain.    [provider]  OXYGEN Inhale 2 L into the lungs continuous.    [provider]  pantoprazole (PROTONIX) 40 MG tablet Take 1 tablet (40 mg total) by mouth daily. 02/05/17   [name], MD  potassium chloride (MICRO-K) 10 MEQ CR capsule Take 10 mEq by mouth 2 (two) times daily.    [provider]  SURE COMFORT INS SYR 1CC/28G 28G X 1/2" 1 ML MISC USE TO INJECT INSULIN UP TO 4 TIMES DAILY. 03/06/17   Cassandria Anger, MD  tiZANidine (ZANAFLEX) 4 MG tablet Take 8 mg by mouth 2 (two) times daily.  01/10/17   [provider]  topiramate (TOPAMAX) 25 MG tablet Take 75 mg by mouth daily.  01/02/17   [provider]  umeclidinium-vilanterol (ANORO ELLIPTA) 62.5-25 MCG/INH AEPB Inhale 1 puff into the lungs daily. 05/26/17   [name], MD  VICTOZA 18 MG/3ML SOPN Inject 1.2 mg into the skin daily.  01/01/17   [provider]    Family History Family History  Problem Relation Age of Onset  . Stroke Mother   . Heart attack Father   . Diabetes Brother   . Cerebral palsy Brother   . Pneumonia Brother   . Diabetes Other   . Heart attack Other     Social History Social History   Tobacco Use  . Smoking status: Former Smoker     Packs/day: 0.25    Types: Cigarettes    Last attempt to quit: 11/24/2014    Years since quitting: 3.1  . Smokeless tobacco: Never Used  Substance Use Topics  . Alcohol use: No    Alcohol/week: 0.0 standard drinks  . Drug use: No     Allergies   Dilaudid [hydromorphone hcl]; Midodrine hcl; Actifed cold-allergy [chlorpheniramine-phenylephrine]; Doxycycline; Other; Penicillins; Reglan [metoclopramide]; Valium [diazepam]; and Vistaril [hydroxyzine hcl]   Review of Systems Review of Systems  Neurological: Positive for weakness.  ROS: Statement: All systems negative except as marked or noted in the HPI; Constitutional: Negative for fever and chills. ; ; Eyes: Negative for eye pain, redness and discharge. ; ; ENMT: Negative for ear pain, hoarseness, nasal congestion, sinus pressure and sore throat. ; ; Cardiovascular: Negative for chest pain, palpitations, diaphoresis, dyspnea and peripheral edema. ; ; Respiratory: Negative for cough, wheezing and stridor. ; ; Gastrointestinal: Negative for nausea, vomiting, diarrhea, abdominal pain, blood in stool, hematemesis, jaundice and rectal bleeding. . ; ; Genitourinary: Negative for dysuria, flank pain and hematuria. ; ; Musculoskeletal: Negative for back pain and neck pain. Negative for swelling and trauma.; ; Skin: +rash. Negative for pruritus, abrasions, blisters, bruising and skin lesion.; ; Neuro: +generalized weakness, neuropathy. Negative for headache, lightheadedness and neck stiffness. Negative for altered level of consciousness, altered mental status, extremity weakness, involuntary movement, seizure and syncope.      Physical Exam Updated Vital Signs BP (!) 94/43 (BP Location: Left Arm)   Pulse 76   Temp 98.2 F (36.8 C) (Rectal)   Resp 18   Ht 5\' 6"  (1.676 m)   Wt 127.5 kg   SpO2 93%   BMI 45.35 kg/m    Patient Vitals for the past 24 hrs:  BP Temp Temp src Pulse Resp SpO2 Height Weight  01/06/18 2055 - - - 67 17 100 % - -  01/06/18  2030 (!) 137/53 - - 75 18 96 % - -  01/06/18 1818 - 98.2 F (36.8 C) Rectal - 18 - - -  01/06/18 1732 (!) 94/43 98.3 F (  36.8 C) Oral 76 18 93 % - -  01/06/18 1730 (!) 94/43 - - 60 17 96 % - -  01/06/18 1729 - - - - - - 5\' 6"  (1.676 m) 127.5 kg   18:27 Orthostatic Vital Signs DF  Orthostatic Lying   BP- Lying: 103/44   Pulse- Lying: 63       Orthostatic Sitting  BP- Sitting: 142/49   Pulse- Sitting: 77       Orthostatic Standing at 0 minutes  BP- Standing at 0 minutes: 132/54   Pulse- Standing at 0 minutes: 88      Physical Exam 1810: Physical examination:  Nursing notes reviewed; Vital signs and O2 SAT reviewed;  Constitutional: Well developed, Well nourished, Well hydrated, In no acute distress; Head:  Normocephalic, atraumatic; Eyes: EOMI, PERRL, No scleral icterus; ENMT: Mouth and pharynx normal, Mucous membranes moist; Neck: Supple, Full range of motion, No lymphadenopathy; Cardiovascular: Regular rate and rhythm, No gallop; Respiratory: Breath sounds clear & equal bilaterally, No wheezes.  Speaking full sentences with ease, Normal respiratory effort/excursion; Chest: Nontender, Movement normal; Abdomen: Obese with large pannus. Soft, Nontender, Normal bowel sounds; Genitourinary: No CVA tenderness; Spine:  No midline CS, TS, LS tenderness. +TTP bilat lumbar paraspinal muscles.;; Extremities: Peripheral pulses normal, No tenderness, +2 pedal edema bilat. No calf asymmetry. +erythema and warmth to right anterior-medial-and posterior lower leg..; Neuro: AA&Ox3, Major CN grossly intact. No facial droop. Speech clear. No gross focal motor or sensory deficits in extremities.; Skin: Color normal, Warm, Dry.    ED Treatments / Results  Labs (all labs ordered are listed, but only abnormal results are displayed)   EKG EKG Interpretation  Date/Time:  Wednesday January 06 2018 18:03:51 EDT Ventricular Rate:  86 PR Interval:    QRS Duration: 91 QT Interval:  395 QTC  Calculation: 473 R Axis:   65 Text Interpretation:  Sinus rhythm Atrial premature complex Low voltage, precordial leads When compared with ECG of 11/24/2017 No significant change was found Confirmed by Francine Graven [phone]) on 01/06/2018 8:12:22 PM   Radiology   Procedures Procedures (including critical care time)  Medications Ordered in ED Medications - No data to display   Initial Impression / Assessment and Plan / ED Course  I have reviewed the triage vital signs and the nursing notes.  Pertinent labs & imaging results that were available during my care of the patient were reviewed by me and considered in my medical decision making (see chart for details).  MDM Reviewed: previous chart, nursing note and vitals Reviewed previous: labs and ECG Interpretation: labs, ECG, x-ray and CT scan   Results for orders placed or performed during the hospital encounter of 01/06/18  Acetaminophen level  Result Value Ref Range   Acetaminophen (Tylenol), Serum <10 (L) 10 - 30 ug/mL  Comprehensive metabolic panel  Result Value Ref Range   Sodium 138 135 - 145 mmol/L   Potassium 5.1 3.5 - 5.1 mmol/L   Chloride 107 98 - 111 mmol/L   CO2 26 22 - 32 mmol/L   Glucose, Bld 226 (H) 70 - 99 mg/dL   BUN 32 (H) 8 - 23 mg/dL   Creatinine, Ser 1.65 (H) 0.44 - 1.00 mg/dL   Calcium 8.6 (L) 8.9 - 10.3 mg/dL   Total Protein 6.4 (L) 6.5 - 8.1 g/dL   Albumin 3.4 (L) 3.5 - 5.0 g/dL   AST 17 15 - 41 U/L   ALT 12 0 - 44 U/L   Alkaline Phosphatase 89  38 - 126 U/L   Total Bilirubin 0.4 0.3 - 1.2 mg/dL   GFR calc non Af Amer 31 (L) >60 mL/min   GFR calc Af Amer 36 (L) >60 mL/min   Anion gap 5 5 - 15  Ethanol  Result Value Ref Range   Alcohol, Ethyl (B) <10 <10 mg/dL  Brain natriuretic peptide  Result Value Ref Range   B Natriuretic Peptide 257.0 (H) 0.0 - 100.0 pg/mL  Troponin I  Result Value Ref Range   Troponin I <1.66 <0.63 ng/mL  Salicylate level  Result Value Ref Range   Salicylate Lvl  <0.1 2.8 - 30.0 mg/dL  Lactic acid, plasma  Result Value Ref Range   Lactic Acid, Venous 1.2 0.5 - 1.9 mmol/L  CBC with Differential  Result Value Ref Range   WBC 5.5 4.0 - 10.5 K/uL   RBC 3.27 (L) 3.87 - 5.11 MIL/uL   Hemoglobin 8.8 (L) 12.0 - 15.0 g/dL   HCT 29.6 (L) 36.0 - 46.0 %   MCV 90.5 78.0 - 100.0 fL   MCH 26.9 26.0 - 34.0 pg   MCHC 29.7 (L) 30.0 - 36.0 g/dL   RDW 15.1 11.5 - 15.5 %   Platelets 300 150 - 400 K/uL   Neutrophils Relative % 60 %   Neutro Abs 3.3 1.7 - 7.7 K/uL   Lymphocytes Relative 23 %   Lymphs Abs 1.3 0.7 - 4.0 K/uL   Monocytes Relative 10 %   Monocytes Absolute 0.5 0.1 - 1.0 K/uL   Eosinophils Relative 5 %   Eosinophils Absolute 0.3 0.0 - 0.7 K/uL   Basophils Relative 2 %   Basophils Absolute 0.1 0.0 - 0.1 K/uL  Protime-INR  Result Value Ref Range   Prothrombin Time 12.9 11.4 - 15.2 seconds   INR 0.98   Urinalysis, Routine w reflex microscopic  Result Value Ref Range   Color, Urine YELLOW YELLOW   APPearance CLEAR CLEAR   Specific Gravity, Urine 1.019 1.005 - 1.030   pH 5.0 5.0 - 8.0   Glucose, UA NEGATIVE NEGATIVE mg/dL   Hgb urine dipstick NEGATIVE NEGATIVE   Bilirubin Urine NEGATIVE NEGATIVE   Ketones, ur NEGATIVE NEGATIVE mg/dL   Protein, ur NEGATIVE NEGATIVE mg/dL   Nitrite NEGATIVE NEGATIVE   Leukocytes, UA NEGATIVE NEGATIVE  CBG monitoring, ED  Result Value Ref Range   Glucose-Capillary 232 (H) 70 - 99 mg/dL   Dg Chest 2 View Result Date: 01/06/2018 CLINICAL DATA:  67 year old with recent progressively worsening generalized weakness and falls. EXAM: CHEST - 2 VIEW COMPARISON:  11/24/2017, 10/15/2017 and earlier. FINDINGS: AP ERECT and LATERAL images were obtained. Cardiac silhouette upper normal in size to slightly enlarged for AP technique, unchanged. Thoracic aorta mildly atherosclerotic, unchanged. Hilar and mediastinal contours otherwise unremarkable. Linear atelectasis in the RIGHT LOWER LOBE and RIGHT MIDDLE LOBE. Lungs otherwise  clear. No localized airspace consolidation. No pleural effusions. No pneumothorax. Normal pulmonary vascularity. Degenerative changes and DISH involving the thoracic spine. IMPRESSION: Mild linear atelectasis involving the RIGHT LOWER LOBE and RIGHT MIDDLE LOBE. No acute cardiopulmonary disease otherwise. Electronically Signed   By: Evangeline Dakin M.D.   On: 01/06/2018 19:07   Ct Head Wo Contrast Result Date: 01/06/2018 CLINICAL DATA:  Altered mental status, weakness and disoriented. Slid from chair to ground. Leg numbness. History of hypertension and diabetes. EXAM: CT HEAD WITHOUT CONTRAST TECHNIQUE: Contiguous axial images were obtained from the base of the skull through the vertex without intravenous contrast. COMPARISON:  CT HEAD  October 13, 2017 FINDINGS: Moderate motion degraded examination. BRAIN: No intraparenchymal hemorrhage, mass effect nor midline shift. The ventricles and sulci are normal for age. Patchy to confluent supratentorial white matter hypodensities. No acute large vascular territory infarcts. No abnormal extra-axial fluid collections. Basal cisterns are patent. VASCULAR: Mild calcific atherosclerosis of the carotid siphons. SKULL: No skull fracture. No significant scalp soft tissue swelling. SINUSES/ORBITS: Paranasal sinuses are well aerated. Minimal RIGHT mastoid effusion.The included ocular globes and orbital contents are non-suspicious. OTHER: None. IMPRESSION: 1. Moderate motion degraded examination. No acute intracranial process. 2. Moderate to severe chronic small vessel ischemic changes. Electronically Signed   By: Elon Alas M.D.   On: 01/06/2018 19:00   Ct Lumbar Spine Wo Contrast Result Date: 01/06/2018 CLINICAL DATA:  Chronic back pain after fall. Decreased responsiveness with fatigue and weakness. EXAM: CT LUMBAR SPINE WITHOUT CONTRAST TECHNIQUE: Multidetector CT imaging of the lumbar spine was performed without intravenous contrast administration. Multiplanar CT image  reconstructions were also generated. COMPARISON:  None. FINDINGS: Segmentation: There are 5 non ribbed lumbar vertebrae. Alignment: Maintained lumbar lordosis without pars defects or listhesis. Vertebrae: Osteopenic appearance of the included lower thoracic and lumbar spine without acute fracture or suspicious osseous lesions. 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 Paraspinal and other soft tissues: Moderate aortoiliac atherosclerosis. Nonobstructing left lower pole renal calculus measuring 5 mm. No adenopathy nor abnormal fluid collections. Disc levels: T11-T12 degenerative disc disease with vacuum disc phenomenon and bilateral facet arthropathy, left greater than right. No focal disc herniation or significant foraminal encroachment. T12-L1: No focal disc herniation or significant foraminal encroachment. Minimal facet arthropathy bilaterally. L1-L2: Mild disc bulge and facet hypertrophy. Slight thickening of the ligamentum flavum bilaterally. Mild prominence of posterior epidural fat contributing to mild central canal stenosis. No significant foraminal encroachment. L2-L3: Degenerative disc disease with vacuum disc phenomenon, facet hypertrophy, moderate disc bulge and ligamentum flavum hypertrophy contributing to mild central canal and moderate right foraminal stenosis. Slight left foraminal stenosis. L3-L4: Mild concentric disc bulge with facet hypertrophy. Mild foraminal stenosis. L4-L5: Advanced degenerative disc disease with marked disc flattening and vacuum disc phenomenon, facet hypertrophy and concentric disc bulge contributing to mild foraminal stenosis. No significant central canal stenosis. L5-S1: Degenerative disc disease with moderate disc flattening, facet hypertrophy but without focal disc herniation. No significant foraminal encroachment. IMPRESSION: 1. 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 2. Concentric moderate disc  bulge, facet hypertrophy and ligamentum flavum hypertrophy at L2-3 contributing to mild central canal stenosis with moderate right and mild left foraminal stenosis. Lesser degrees of concentric disc bulging noted at L3-4 at L4-5. 3. No acute nor aggressive osseous abnormality. 4. Aortoiliac atherosclerosis without aneurysm. 5. 5 mm nonobstructing left lower pole renal calculus. Electronically Signed   By: Ashley Royalty M.D.   On: 01/06/2018 19:10    Results for ASJA, FROMMER (MRN [id number]) as of 01/06/2018 20:14  Ref. Range 11/25/2017 05:02 11/26/2017 05:38 01/06/2018 18:20  BUN Latest Ref Range: 8 - 23 mg/dL 27 (H) 28 (H) 32 (H)  Creatinine Latest Ref Range: 0.44 - 1.00 mg/dL 1.41 (H) 1.32 (H) 1.65 (H)   Results for AMELIANA, BRASHEAR (MRN [id number]) as of 01/06/2018 20:14  Ref. Range 10/24/2017 06:35 11/24/2017 12:18 11/25/2017 05:02 01/06/2018 18:20  Hemoglobin Latest Ref Range: 12.0 - 15.0 g/dL 9.3 (L) 9.0 (L) 9.1 (L) 8.8 (L)  HCT Latest Ref Range: 36.0 - 46.0 % 30.0 (L) 29.6 (L) 30.2 (L) 29.6 (  L)    2035:  Judicious IVF given for soft BP with improvement. RLE erythema, but no fever, WBC count or elevated lactic acid to suggest cellulitis at this time; will continue to monitor. Pt is not multiple sedating medications which could contribute to her AMS. Pt's chart also notes that pt is non-compliant with her meds, and stops/starts meds herself without informing her physicians. T/C returned from [facility]. Olevia Bowens, case discussed, including:  HPI, pertinent PM/SHx, VS/PE, dx testing, ED course and treatment:  Agreeable to admit.            Final Clinical Impressions(s) / ED Diagnoses   Final diagnoses:  None    ED Discharge Orders    None       Francine Graven, DO 01/07/18 2253

## 2018-01-07 ENCOUNTER — Observation Stay (HOSPITAL_COMMUNITY)
Admit: 2018-01-07 | Discharge: 2018-01-07 | Disposition: A | Discharge status: Home / Self Care | Payer: Medicare HMO | Attending: Internal Medicine | Admitting: Internal Medicine

## 2018-01-07 ENCOUNTER — Encounter (HOSPITAL_COMMUNITY): Payer: Self-pay | Admitting: Internal Medicine

## 2018-01-07 ENCOUNTER — Other Ambulatory Visit: Payer: Self-pay

## 2018-01-07 DIAGNOSIS — J438 Other emphysema: Secondary | ICD-10-CM

## 2018-01-07 DIAGNOSIS — J9611 Chronic respiratory failure with hypoxia: Secondary | ICD-10-CM

## 2018-01-07 DIAGNOSIS — L03115 Cellulitis of right lower limb: Secondary | ICD-10-CM | POA: Diagnosis not present

## 2018-01-07 DIAGNOSIS — R4182 Altered mental status, unspecified: Secondary | ICD-10-CM

## 2018-01-07 DIAGNOSIS — I5032 Chronic diastolic (congestive) heart failure: Secondary | ICD-10-CM | POA: Diagnosis not present

## 2018-01-07 DIAGNOSIS — E038 Other specified hypothyroidism: Secondary | ICD-10-CM

## 2018-01-07 DIAGNOSIS — I10 Essential (primary) hypertension: Secondary | ICD-10-CM

## 2018-01-07 LAB — GLUCOSE, CAPILLARY
Glucose-Capillary: 311 mg/dL — ABNORMAL HIGH (ref 70–99)
Glucose-Capillary: 293 mg/dL — ABNORMAL HIGH (ref 70–99)
Glucose-Capillary: 164 mg/dL — ABNORMAL HIGH (ref 70–99)
Glucose-Capillary: 205 mg/dL — ABNORMAL HIGH (ref 70–99)

## 2018-01-07 LAB — CBC
HCT: 28.5 % — ABNORMAL LOW (ref 36.0–46.0)
Hemoglobin: 8.4 g/dL — ABNORMAL LOW (ref 12.0–15.0)
MCH: 26.8 pg (ref 26.0–34.0)
MCHC: 29.5 g/dL — ABNORMAL LOW (ref 30.0–36.0)
MCV: 90.8 fL (ref 78.0–100.0)
Platelets: 248 10*3/uL (ref 150–400)
RBC: 3.14 MIL/uL — ABNORMAL LOW (ref 3.87–5.11)
RDW: 15.1 % (ref 11.5–15.5)
WBC: 5.1 10*3/uL (ref 4.0–10.5)

## 2018-01-07 LAB — BASIC METABOLIC PANEL
Anion gap: 6 (ref 5–15)
BUN: 31 mg/dL — ABNORMAL HIGH (ref 8–23)
CO2: 27 mmol/L (ref 22–32)
Calcium: 8.3 mg/dL — ABNORMAL LOW (ref 8.9–10.3)
Chloride: 105 mmol/L (ref 98–111)
Creatinine, Ser: 1.38 mg/dL — ABNORMAL HIGH (ref 0.44–1.00)
GFR calc Af Amer: 45 mL/min — ABNORMAL LOW (ref >60)
GFR calc non Af Amer: 39 mL/min — ABNORMAL LOW (ref >60)
Glucose, Bld: 302 mg/dL — ABNORMAL HIGH (ref 70–99)
Potassium: 4.3 mmol/L (ref 3.5–5.1)
Sodium: 138 mmol/L (ref 135–145)

## 2018-01-07 LAB — MRSA PCR SCREENING: MRSA by PCR: NEGATIVE

## 2018-01-07 MED ORDER — INSULIN ASPART 100 UNIT/ML ~~LOC~~ SOLN
0.0000 [IU] | Freq: Three times a day (TID) | SUBCUTANEOUS | Status: DC
  Administered 2018-01-07: 11 [IU] via SUBCUTANEOUS
  Administered 2018-01-07: 4 [IU] via SUBCUTANEOUS
  Administered 2018-01-08 – 2018-01-09 (×4): 7 [IU] via SUBCUTANEOUS
  Administered 2018-01-09: 4 [IU] via SUBCUTANEOUS
  Administered 2018-01-09: 11 [IU] via SUBCUTANEOUS
  Administered 2018-01-10: 7 [IU] via SUBCUTANEOUS
  Administered 2018-01-10: 11 [IU] via SUBCUTANEOUS
  Administered 2018-01-10 – 2018-01-11 (×3): 4 [IU] via SUBCUTANEOUS

## 2018-01-07 MED ORDER — LIRAGLUTIDE 18 MG/3ML ~~LOC~~ SOPN: 1.2000 mg | PEN_INJECTOR | Freq: Every day | SUBCUTANEOUS | Status: DC

## 2018-01-07 MED ORDER — FUROSEMIDE 20 MG PO TABS: 20.0000 mg | ORAL_TABLET | Freq: Every day | ORAL | Status: DC | PRN

## 2018-01-07 MED ORDER — ASPIRIN EC 81 MG PO TBEC
81.0000 mg | DELAYED_RELEASE_TABLET | Freq: Every day | ORAL | Status: DC
  Administered 2018-01-07 – 2018-01-11 (×5): 81 mg via ORAL
  Filled 2018-01-07 (×5): qty 1

## 2018-01-07 MED ORDER — LEVOTHYROXINE SODIUM 75 MCG PO TABS
175.0000 ug | ORAL_TABLET | Freq: Every day | ORAL | Status: DC
  Administered 2018-01-07 – 2018-01-11 (×5): 175 ug via ORAL
  Filled 2018-01-07 (×5): qty 1

## 2018-01-07 MED ORDER — VITAMIN D 1000 UNITS PO TABS
5000.0000 [IU] | ORAL_TABLET | Freq: Every day | ORAL | Status: DC
  Administered 2018-01-07 – 2018-01-11 (×5): 5000 [IU] via ORAL
  Filled 2018-01-07 (×5): qty 5

## 2018-01-07 MED ORDER — OXYCODONE-ACETAMINOPHEN 5-325 MG PO TABS
1.0000 | ORAL_TABLET | Freq: Four times a day (QID) | ORAL | Status: DC | PRN
  Administered 2018-01-07 – 2018-01-09 (×6): 1 via ORAL
  Filled 2018-01-07 (×6): qty 1

## 2018-01-07 MED ORDER — FUROSEMIDE 20 MG PO TABS
20.0000 mg | ORAL_TABLET | Freq: Every day | ORAL | Status: DC
  Administered 2018-01-07 – 2018-01-08 (×2): 20 mg via ORAL
  Filled 2018-01-07 (×2): qty 1

## 2018-01-07 MED ORDER — ATORVASTATIN CALCIUM 40 MG PO TABS
80.0000 mg | ORAL_TABLET | Freq: Every morning | ORAL | Status: DC
  Administered 2018-01-07 – 2018-01-11 (×5): 80 mg via ORAL
  Filled 2018-01-07 (×5): qty 2

## 2018-01-07 MED ORDER — CLINDAMYCIN PHOSPHATE 900 MG/50ML IV SOLN
900.0000 mg | Freq: Four times a day (QID) | INTRAVENOUS | Status: DC
  Administered 2018-01-07 (×2): 900 mg via INTRAVENOUS
  Filled 2018-01-07 (×2): qty 50

## 2018-01-07 MED ORDER — GABAPENTIN 300 MG PO CAPS
600.0000 mg | ORAL_CAPSULE | Freq: Four times a day (QID) | ORAL | Status: DC
  Administered 2018-01-07 – 2018-01-08 (×7): 600 mg via ORAL
  Filled 2018-01-07 (×7): qty 2

## 2018-01-07 MED ORDER — OXYCODONE-ACETAMINOPHEN 10-325 MG PO TABS: 1.0000 | ORAL_TABLET | Freq: Four times a day (QID) | ORAL | Status: DC | PRN

## 2018-01-07 MED ORDER — LEVOFLOXACIN IN D5W 500 MG/100ML IV SOLN: 500.0000 mg | INTRAVENOUS | Status: DC

## 2018-01-07 MED ORDER — TOPIRAMATE 25 MG PO TABS
75.0000 mg | ORAL_TABLET | Freq: Every day | ORAL | Status: DC
  Administered 2018-01-07 – 2018-01-11 (×5): 75 mg via ORAL
  Filled 2018-01-07 (×5): qty 3

## 2018-01-07 MED ORDER — LEVOFLOXACIN IN D5W 750 MG/150ML IV SOLN: 750.0000 mg | INTRAVENOUS | Status: DC

## 2018-01-07 MED ORDER — PANTOPRAZOLE SODIUM 40 MG PO TBEC
40.0000 mg | DELAYED_RELEASE_TABLET | Freq: Two times a day (BID) | ORAL | Status: DC
  Administered 2018-01-07 – 2018-01-11 (×9): 40 mg via ORAL
  Filled 2018-01-07 (×9): qty 1

## 2018-01-07 MED ORDER — INSULIN ASPART 100 UNIT/ML ~~LOC~~ SOLN
0.0000 [IU] | Freq: Every day | SUBCUTANEOUS | Status: DC
  Administered 2018-01-07 – 2018-01-08 (×2): 2 [IU] via SUBCUTANEOUS
  Administered 2018-01-09: 3 [IU] via SUBCUTANEOUS

## 2018-01-07 MED ORDER — OXYCODONE HCL 5 MG PO TABS
5.0000 mg | ORAL_TABLET | Freq: Four times a day (QID) | ORAL | Status: DC | PRN
  Administered 2018-01-07 – 2018-01-08 (×4): 5 mg via ORAL
  Filled 2018-01-07 (×5): qty 1

## 2018-01-07 MED ORDER — DOCUSATE SODIUM 100 MG PO CAPS
300.0000 mg | ORAL_CAPSULE | Freq: Every day | ORAL | Status: DC
  Administered 2018-01-07 – 2018-01-11 (×5): 300 mg via ORAL
  Filled 2018-01-07 (×5): qty 3

## 2018-01-07 MED ORDER — MILK AND MOLASSES ENEMA: 1.0000 | Freq: Once | RECTAL | Status: DC

## 2018-01-07 MED ORDER — GABAPENTIN 600 MG PO TABS
600.0000 mg | ORAL_TABLET | Freq: Four times a day (QID) | ORAL | Status: DC
  Filled 2018-01-07 (×6): qty 1

## 2018-01-07 MED ORDER — CLOPIDOGREL BISULFATE 75 MG PO TABS
75.0000 mg | ORAL_TABLET | Freq: Every day | ORAL | Status: DC
  Administered 2018-01-07 – 2018-01-11 (×5): 75 mg via ORAL
  Filled 2018-01-07 (×5): qty 1

## 2018-01-07 MED ORDER — LEVOFLOXACIN IN D5W 750 MG/150ML IV SOLN
750.0000 mg | Freq: Once | INTRAVENOUS | Status: AC
  Administered 2018-01-07: 750 mg via INTRAVENOUS
  Filled 2018-01-07: qty 150

## 2018-01-07 MED ORDER — INSULIN DETEMIR 100 UNIT/ML ~~LOC~~ SOLN
40.0000 [IU] | Freq: Every day | SUBCUTANEOUS | Status: DC
  Administered 2018-01-07 – 2018-01-10 (×4): 40 [IU] via SUBCUTANEOUS
  Filled 2018-01-07 (×5): qty 0.4

## 2018-01-07 MED ORDER — CLINDAMYCIN PHOSPHATE 600 MG/50ML IV SOLN
600.0000 mg | Freq: Four times a day (QID) | INTRAVENOUS | Status: DC
  Administered 2018-01-07 – 2018-01-08 (×4): 600 mg via INTRAVENOUS
  Filled 2018-01-07 (×4): qty 50

## 2018-01-07 MED ORDER — POTASSIUM CHLORIDE CRYS ER 20 MEQ PO TBCR
20.0000 meq | EXTENDED_RELEASE_TABLET | Freq: Two times a day (BID) | ORAL | Status: DC
  Administered 2018-01-07 – 2018-01-08 (×3): 20 meq via ORAL
  Filled 2018-01-07 (×3): qty 1

## 2018-01-07 MED ORDER — UMECLIDINIUM-VILANTEROL 62.5-25 MCG/INH IN AEPB
1.0000 | INHALATION_SPRAY | Freq: Every day | RESPIRATORY_TRACT | Status: DC
  Administered 2018-01-07 – 2018-01-09 (×3): 1 via RESPIRATORY_TRACT
  Filled 2018-01-07: qty 14

## 2018-01-07 MED ORDER — NYSTATIN 100000 UNIT/GM EX POWD
Freq: Two times a day (BID) | CUTANEOUS | Status: DC
  Administered 2018-01-07 – 2018-01-11 (×9): via TOPICAL
  Filled 2018-01-07 (×2): qty 15

## 2018-01-07 MED ORDER — ALPRAZOLAM 1 MG PO TABS
1.0000 mg | ORAL_TABLET | Freq: Three times a day (TID) | ORAL | Status: DC | PRN
  Administered 2018-01-07 – 2018-01-08 (×5): 1 mg via ORAL
  Filled 2018-01-07 (×5): qty 1

## 2018-01-07 MED ORDER — ALBUTEROL SULFATE (2.5 MG/3ML) 0.083% IN NEBU: 2.5000 mg | INHALATION_SOLUTION | Freq: Two times a day (BID) | RESPIRATORY_TRACT | Status: DC | PRN

## 2018-01-07 MED ORDER — FERROUS SULFATE 325 (65 FE) MG PO TABS
325.0000 mg | ORAL_TABLET | Freq: Every day | ORAL | Status: DC
  Administered 2018-01-07 – 2018-01-11 (×5): 325 mg via ORAL
  Filled 2018-01-07 (×5): qty 1

## 2018-01-07 MED ORDER — INSULIN DETEMIR 100 UNIT/ML ~~LOC~~ SOLN
40.0000 [IU] | Freq: Every day | SUBCUTANEOUS | Status: DC
  Filled 2018-01-07: qty 0.4

## 2018-01-07 NOTE — Progress Notes (Signed)
PROGRESS NOTE    Stephanie Combs  UVO:[id number] DOB: [birth year] DOA: 01/06/2018 PCP: [name], MD    Brief Narrative:  67 year old female with multiple medical problems including coronary artery disease, chronic diastolic CHF, chronic kidney disease, COPD, chronic pedal edema, presents with an episode of hypotension/syncope.  Noted to be hypotensive on arrival to the emergency room.  She is noted to have some significant lower extremity cellulitis and was started on intravenous antibiotics.  She did receive some IV fluids.  She does have a history of orthostatic hypotension.  Continue on intravenous antibiotics for today.  Monitor in the hospital overnight.   Assessment & Plan:   Principal Problem:   Altered mental status Active Problems:   COPD (chronic obstructive pulmonary disease) (HCC)   Morbid obesity (HCC)   HTN (hypertension), benign   Chronic diastolic CHF (congestive heart failure) (HCC)   Hypothyroidism   Personal history of noncompliance with medical treatment, presenting hazards to health   Anxiety   Chronic respiratory failure with hypoxia (HCC)   Cellulitis of right lower extremity   1. Altered mental status/syncope.  Patient did have elevated creatinine on arrival which has improved with some fluids.  She may have some degree of dehydration.  She also has orthostatic hypotension.  She has had an extensive cardiac work-up recently and is followed by cardiology as an outpatient.  EEG has been performed and has been unrevealing.  Mental status appears to be improving.  Echocardiogram was recently performed did not show any clear cause of syncope 2. Cellulitis.  Started on intravenous antibiotics.  Overall erythema lower extremities is improving. 3. Chronic diastolic CHF.  Patient does have chronic lower extremity edema.  Does not appear to have any pulmonary compromise at this time.  Continue her home dose of Lasix. 4. Diabetes.  Restarted on Levemir.  Continue on  sliding scale insulin.  Blood sugars initially elevated, but have not improved. 5. COPD with chronic respiratory failure.  No signs of wheezing or worsening shortness of breath.  Continue current treatments. 6. Hypothyroidism.  Continue on home dose of Synthroid 7. Chronic kidney disease stage III.  Creatinine appears to be improved back to baseline.  Continue to monitor. 8. Anemia chronic kidney disease.  No signs of bleeding at this time.  Continue to follow hemoglobin.   DVT prophylaxis: Heparin Code Status: Full code Family Communication: No family present Disposition Plan: Discharge home once improved   Consultants:     Procedures:   EEG: No epileptiform discharges.  Antimicrobials:   Clindamycin 8/28 >  Levofloxacin 8/28 > 8/29   Subjective: Says she does not feel well today.  Feels a lot of "fever" in her legs.  Feels generally weak.  Reports she has pain in her feet when she tries to stand out.  Objective: Vitals:   01/06/18 2055 01/07/18 0649 01/07/18 1200 01/07/18 1339  BP:  131/64  (!) 139/59  Pulse: 67 98  86  Resp: 17 18  16   Temp:  98 F (36.7 C)  98.2 F (36.8 C)  TempSrc:  Oral  Oral  SpO2: 100% 98% 98% 99%  Weight:      Height:        Intake/Output Summary (Last 24 hours) at 01/07/2018 1913 Last data filed at 01/07/2018 1600 Gross per 24 hour  Intake 2837.5 ml  Output 400 ml  Net 2437.5 ml   Filed Weights   01/06/18 1729  Weight: 127.5 kg    Examination:  General exam:  Appears calm and comfortable  Respiratory system: Clear to auscultation. Respiratory effort normal. Cardiovascular system: S1 & S2 heard, RRR. No JVD, murmurs, rubs, gallops or clicks.  1+ pedal edema. Gastrointestinal system: Abdomen is nondistended, soft and nontender. No organomegaly or masses felt. Normal bowel sounds heard. Central nervous system: Alert and oriented. No focal neurological deficits. Extremities: Symmetric 5 x 5 power. Skin: Mild erythema of her lower  extremities bilaterally Psychiatry: Judgement and insight appear normal. Mood & affect appropriate.     Data Reviewed: I have personally reviewed following labs and imaging studies  CBC: Recent Labs  Lab 01/06/18 1820 01/07/18 0427  WBC 5.5 5.1  NEUTROABS 3.3  --   HGB 8.8* 8.4*  HCT 29.6* 28.5*  MCV 90.5 90.8  PLT 300 841   Basic Metabolic Panel: Recent Labs  Lab 01/06/18 1820 01/07/18 0427  NA 138 138  K 5.1 4.3  CL 107 105  CO2 26 27  GLUCOSE 226* 302*  BUN 32* 31*  CREATININE 1.65* 1.38*  CALCIUM 8.6* 8.3*   GFR: Estimated Creatinine Clearance: 54.1 mL/min (A) (by C-G formula based on SCr of 1.38 mg/dL (H)). Liver Function Tests: Recent Labs  Lab 01/06/18 1820  AST 17  ALT 12  ALKPHOS 89  BILITOT 0.4  PROT 6.4*  ALBUMIN 3.4*   No results for input(s): LIPASE, AMYLASE in the last 168 hours. No results for input(s): AMMONIA in the last 168 hours. Coagulation Profile: Recent Labs  Lab 01/06/18 1820  INR 0.98   Cardiac Enzymes: Recent Labs  Lab 01/06/18 1820  TROPONINI <0.03   BNP (last 3 results) No results for input(s): PROBNP in the last 8760 hours. HbA1C: No results for input(s): HGBA1C in the last 72 hours. CBG: Recent Labs  Lab 01/06/18 1730 01/07/18 0944 01/07/18 1140 01/07/18 1627  GLUCAP 232* 311* 293* 164*   Lipid Profile: No results for input(s): CHOL, HDL, LDLCALC, TRIG, CHOLHDL, LDLDIRECT in the last 72 hours. Thyroid Function Tests: No results for input(s): TSH, T4TOTAL, FREET4, T3FREE, THYROIDAB in the last 72 hours. Anemia Panel: No results for input(s): VITAMINB12, FOLATE, FERRITIN, TIBC, IRON, RETICCTPCT in the last 72 hours. Sepsis Labs: Recent Labs  Lab 01/06/18 1906 01/06/18 2005  LATICACIDVEN 1.2 0.8    Recent Results (from the past 240 hour(s))  Culture, blood (routine x 2)     Status: None (Preliminary result)   Collection Time: 01/07/18  6:38 AM  Result Value Ref Range Status   Specimen Description  BLOOD LEFT ARM  Final   Special Requests   Final    BOTTLES DRAWN AEROBIC AND ANAEROBIC Blood Culture adequate volume   Culture   Final    NO GROWTH <12 HOURS Performed at [facility], [address] [place], [place] [address]    Report Status PENDING  Incomplete  Culture, blood (routine x 2)     Status: None (Preliminary result)   Collection Time: 01/07/18  6:38 AM  Result Value Ref Range Status   Specimen Description BLOOD RIGHT HAND  Final   Special Requests   Final    BOTTLES DRAWN AEROBIC AND ANAEROBIC Blood Culture adequate volume   Culture   Final    NO GROWTH <12 HOURS Performed at [facility], [address] [place], [place] [address]    Report Status PENDING  Incomplete  MRSA PCR Screening     Status: None   Collection Time: 01/07/18  1:06 PM  Result Value Ref Range Status   MRSA by  PCR NEGATIVE NEGATIVE Final    Comment:        The GeneXpert MRSA Assay (FDA approved for NASAL specimens only), is one component of a comprehensive MRSA colonization surveillance program. It is not intended to diagnose MRSA infection nor to guide or monitor treatment for MRSA infections. Performed at [facility], [address] [place], [place] [address]          Radiology Studies: Dg Chest 2 View  Result Date: 01/06/2018 CLINICAL DATA:  67 year old with recent progressively worsening generalized weakness and falls. EXAM: CHEST - 2 VIEW COMPARISON:  11/24/2017, 10/15/2017 and earlier. FINDINGS: AP ERECT and LATERAL images were obtained. Cardiac silhouette upper normal in size to slightly enlarged for AP technique, unchanged. Thoracic aorta mildly atherosclerotic, unchanged. Hilar and mediastinal contours otherwise unremarkable. Linear atelectasis in the RIGHT LOWER LOBE and RIGHT MIDDLE LOBE. Lungs otherwise clear. No localized airspace consolidation. No pleural effusions. No pneumothorax. Normal pulmonary vascularity. Degenerative changes and DISH involving the thoracic spine.  IMPRESSION: Mild linear atelectasis involving the RIGHT LOWER LOBE and RIGHT MIDDLE LOBE. No acute cardiopulmonary disease otherwise. Electronically Signed   By: Evangeline Dakin M.D.   On: 01/06/2018 19:07   Ct Head Wo Contrast  Result Date: 01/06/2018 CLINICAL DATA:  Altered mental status, weakness and disoriented. Slid from chair to ground. Leg numbness. History of hypertension and diabetes. EXAM: CT HEAD WITHOUT CONTRAST TECHNIQUE: Contiguous axial images were obtained from the base of the skull through the vertex without intravenous contrast. COMPARISON:  CT HEAD October 13, 2017 FINDINGS: Moderate motion degraded examination. BRAIN: No intraparenchymal hemorrhage, mass effect nor midline shift. The ventricles and sulci are normal for age. Patchy to confluent supratentorial white matter hypodensities. No acute large vascular territory infarcts. No abnormal extra-axial fluid collections. Basal cisterns are patent. VASCULAR: Mild calcific atherosclerosis of the carotid siphons. SKULL: No skull fracture. No significant scalp soft tissue swelling. SINUSES/ORBITS: Paranasal sinuses are well aerated. Minimal RIGHT mastoid effusion.The included ocular globes and orbital contents are non-suspicious. OTHER: None. IMPRESSION: 1. Moderate motion degraded examination. No acute intracranial process. 2. Moderate to severe chronic small vessel ischemic changes. Electronically Signed   By: Elon Alas M.D.   On: 01/06/2018 19:00   Ct Lumbar Spine Wo Contrast  Result Date: 01/06/2018 CLINICAL DATA:  Chronic back pain after fall. Decreased responsiveness with fatigue and weakness. EXAM: CT LUMBAR SPINE WITHOUT CONTRAST TECHNIQUE: Multidetector CT imaging of the lumbar spine was performed without intravenous contrast administration. Multiplanar CT image reconstructions were also generated. COMPARISON:  None. FINDINGS: Segmentation: There are 5 non ribbed lumbar vertebrae. Alignment: Maintained lumbar lordosis without  pars defects or listhesis. Vertebrae: Osteopenic appearance of the included lower thoracic and lumbar spine without acute fracture or suspicious osseous lesions. 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 Paraspinal and other soft tissues: Moderate aortoiliac atherosclerosis. Nonobstructing left lower pole renal calculus measuring 5 mm. No adenopathy nor abnormal fluid collections. Disc levels: T11-T12 degenerative disc disease with vacuum disc phenomenon and bilateral facet arthropathy, left greater than right. No focal disc herniation or significant foraminal encroachment. T12-L1: No focal disc herniation or significant foraminal encroachment. Minimal facet arthropathy bilaterally. L1-L2: Mild disc bulge and facet hypertrophy. Slight thickening of the ligamentum flavum bilaterally. Mild prominence of posterior epidural fat contributing to mild central canal stenosis. No significant foraminal encroachment. L2-L3: Degenerative disc disease with vacuum disc phenomenon, facet hypertrophy, moderate disc bulge and ligamentum flavum hypertrophy contributing to mild central canal and moderate right foraminal stenosis. Slight left  foraminal stenosis. L3-L4: Mild concentric disc bulge with facet hypertrophy. Mild foraminal stenosis. L4-L5: Advanced degenerative disc disease with marked disc flattening and vacuum disc phenomenon, facet hypertrophy and concentric disc bulge contributing to mild foraminal stenosis. No significant central canal stenosis. L5-S1: Degenerative disc disease with moderate disc flattening, facet hypertrophy but without focal disc herniation. No significant foraminal encroachment. IMPRESSION: 1. 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 2. Concentric moderate disc bulge, facet hypertrophy and ligamentum flavum hypertrophy at L2-3 contributing to mild central canal stenosis with moderate right and mild left foraminal stenosis.  Lesser degrees of concentric disc bulging noted at L3-4 at L4-5. 3. No acute nor aggressive osseous abnormality. 4. Aortoiliac atherosclerosis without aneurysm. 5. 5 mm nonobstructing left lower pole renal calculus. Electronically Signed   By: Ashley Royalty M.D.   On: 01/06/2018 19:10        Scheduled Meds: . aspirin EC  81 mg Oral Daily  . atorvastatin  80 mg Oral q morning - 10a  . cholecalciferol  5,000 Units Oral Daily  . clopidogrel  75 mg Oral Daily  . docusate sodium  300 mg Oral Daily  . ferrous sulfate  325 mg Oral Q breakfast  . gabapentin  600 mg Oral QID  . heparin  5,000 Units Subcutaneous Q8H  . insulin aspart  0-20 Units Subcutaneous TID WC  . insulin aspart  0-5 Units Subcutaneous QHS  . insulin detemir  40 Units Subcutaneous Daily  . levothyroxine  175 mcg Oral QAC breakfast  . milk and molasses  1 enema Rectal Once  . nystatin   Topical BID  . pantoprazole  40 mg Oral BID  . potassium chloride  20 mEq Oral BID  . topiramate  75 mg Oral Daily  . umeclidinium-vilanterol  1 puff Inhalation Daily   Continuous Infusions: . [START ON 01/08/2018] clindamycin (CLEOCIN) IV       LOS: 0 days    Time spent: 44mins    Kathie Dike, MD [facility] Pager [phone]  If 7PM-7AM, please contact night-coverage [email or web address] Password [facility] 01/07/2018, 7:13 PM

## 2018-01-07 NOTE — Progress Notes (Signed)
EEG completed, results pending. 

## 2018-01-07 NOTE — Procedures (Signed)
ELECTROENCEPHALOGRAM REPORT   Patient: Stephanie Combs       Room #: G269 EEG No. ID: [id number] Age: 67 y.o.        Sex: female Referring Physician: Memon Report Date:  01/07/2018        Interpreting Physician: Alexis Goodell  History: Stephanie Combs is an 67 y.o. female with episodes of recurrent syncope  Medications:  Topamax, K-dur, Protonix, Synthroid, Levaquin, Insulin, Ferrous sulfate, Colace, Plavix, Cleocin, Vitamin D, Lipitor, ASA  Conditions of Recording:  This is a 21 channel routine scalp EEG performed with bipolar and monopolar montages arranged in accordance to the international 10/20 system of electrode placement. One channel was dedicated to EKG recording.  The patient is in the awake, drowsy and asleep states.  Description:  The waking background activity consists of a low voltage, symmetrical, fairly well organized, 10-11 Hz alpha activity, seen from the parieto-occipital and posterior temporal regions.  Low voltage fast activity, poorly organized, is seen anteriorly and is at times superimposed on more posterior regions.  A mixture of theta and alpha rhythms are seen from the central and temporal regions. The patient drowses with slowing to irregular, low voltage theta and beta activity.   The patient goes in to a light sleep with symmetrical sleep spindles, vertex central sharp transients and irregular slow activity.  No epileptiform activity is noted.   Hyperventilation and intermittent photic stimulation were not performed.  IMPRESSION: Normal electroencephalogram, awake, asleep and with activation procedures. There are no focal lateralizing or epileptiform features.   Alexis Goodell, MD Neurology [phone] 01/07/2018, 4:06 PM

## 2018-01-07 NOTE — Progress Notes (Signed)
Inpatient Diabetes Program Recommendations  AACE/ADA: New Consensus Statement on Inpatient Glycemic Control (2015)  Target Ranges:  Prepandial:   less than 140 mg/dL      Peak postprandial:   less than 180 mg/dL (1-2 hours)      Critically ill patients:  140 - 180 mg/dL   Lab Results  Component Value Date   GLUCAP 311 (H) 01/07/2018   HGBA1C 8.1 (H) 10/13/2017    Review of Glycemic Control Results for Stephanie Combs, Stephanie Combs (MRN [id number]) as of 01/07/2018 10:21  Ref. Range 01/06/2018 17:30 01/07/2018 09:44  Glucose-Capillary Latest Ref Range: 70 - 99 mg/dL 232 (H) 311 (H)   Diabetes history: DM2 Diabetes medications: Levemir 40 units + Novolog 1-18 units tid meal coverage + Victoza 1.2 mg Current orders for Inpatient glycemic control: Levemir 40 units + Victoza  Inpatient Diabetes Program Recommendations:    Current CBG 311 without correction ordered. -Give Levemir this am instead of waiting until hs -Add novolog moderate correction tid + hs 0-5 units Text page to Dr. Roderic Palau with recommendations.  Thank you, Nani Gasser. Midori Dado, RN, MSN, CDE  Diabetes Coordinator Inpatient Glycemic Control Team Team Pager [phone] (8am-5pm) 01/07/2018 10:26 AM

## 2018-01-07 NOTE — Care Management Obs Status (Signed)
[place] NOTIFICATION   Patient Details  Name: ASTA CORBRIDGE MRN: [id number] Date of Birth: [birth year]   Medicare Observation Status Notification Given:  Yes    Sherald Barge, RN 01/07/2018, 2:32 PM

## 2018-01-07 NOTE — Progress Notes (Signed)
PT Cancellation Note  Patient Details Name: Stephanie Combs MRN: [id number] DOB: May 07, 1951   Cancelled Treatment:    Reason Eval/Treat Not Completed: Patient declined, no reason specified.  Patient declined therapy secondary to not wanting to increase pain in BLE/feet and wants 1 day of antibiotics for cellulitis before starting therapy - RN/CM notified.   12:31 PM, 01/07/18 Lonell Grandchild, MPT Physical Therapist with [facility] [phone] office [phone] mobile phone

## 2018-01-08 DIAGNOSIS — R4182 Altered mental status, unspecified: Secondary | ICD-10-CM | POA: Diagnosis not present

## 2018-01-08 DIAGNOSIS — R531 Weakness: Secondary | ICD-10-CM

## 2018-01-08 DIAGNOSIS — I5032 Chronic diastolic (congestive) heart failure: Secondary | ICD-10-CM | POA: Diagnosis not present

## 2018-01-08 DIAGNOSIS — L03115 Cellulitis of right lower limb: Secondary | ICD-10-CM | POA: Diagnosis not present

## 2018-01-08 LAB — BASIC METABOLIC PANEL
Anion gap: 4 — ABNORMAL LOW (ref 5–15)
BUN: 30 mg/dL — ABNORMAL HIGH (ref 8–23)
CO2: 28 mmol/L (ref 22–32)
Calcium: 8.6 mg/dL — ABNORMAL LOW (ref 8.9–10.3)
Chloride: 106 mmol/L (ref 98–111)
Creatinine, Ser: 1.28 mg/dL — ABNORMAL HIGH (ref 0.44–1.00)
GFR calc Af Amer: 49 mL/min — ABNORMAL LOW (ref >60)
GFR calc non Af Amer: 42 mL/min — ABNORMAL LOW (ref >60)
Glucose, Bld: 172 mg/dL — ABNORMAL HIGH (ref 70–99)
Potassium: 5 mmol/L (ref 3.5–5.1)
Sodium: 138 mmol/L (ref 135–145)

## 2018-01-08 LAB — URINE CULTURE: Culture: NO GROWTH

## 2018-01-08 LAB — GLUCOSE, CAPILLARY
Glucose-Capillary: 210 mg/dL — ABNORMAL HIGH (ref 70–99)
Glucose-Capillary: 203 mg/dL — ABNORMAL HIGH (ref 70–99)
Glucose-Capillary: 225 mg/dL — ABNORMAL HIGH (ref 70–99)
Glucose-Capillary: 207 mg/dL — ABNORMAL HIGH (ref 70–99)

## 2018-01-08 LAB — BLOOD GAS, ARTERIAL
Acid-base deficit: 0.7 mmol/L (ref 0.0–2.0)
Bicarbonate: 23.6 mmol/L (ref 20.0–28.0)
Drawn by: 105551
FIO2: 28
O2 Saturation: 97.9 %
pCO2 arterial: 48.4 mmHg — ABNORMAL HIGH (ref 32.0–48.0)
pH, Arterial: 7.325 — ABNORMAL LOW (ref 7.350–7.450)
pO2, Arterial: 109 mmHg — ABNORMAL HIGH (ref 83.0–108.0)

## 2018-01-08 LAB — CBC
HCT: 28.9 % — ABNORMAL LOW (ref 36.0–46.0)
Hemoglobin: 8.4 g/dL — ABNORMAL LOW (ref 12.0–15.0)
MCH: 26.7 pg (ref 26.0–34.0)
MCHC: 29.1 g/dL — ABNORMAL LOW (ref 30.0–36.0)
MCV: 91.7 fL (ref 78.0–100.0)
Platelets: 268 10*3/uL (ref 150–400)
RBC: 3.15 MIL/uL — ABNORMAL LOW (ref 3.87–5.11)
RDW: 14.9 % (ref 11.5–15.5)
WBC: 5.2 10*3/uL (ref 4.0–10.5)

## 2018-01-08 LAB — AMMONIA: Ammonia: 16 umol/L (ref 9–35)

## 2018-01-08 MED ORDER — FUROSEMIDE 10 MG/ML IJ SOLN
20.0000 mg | Freq: Two times a day (BID) | INTRAMUSCULAR | Status: DC
  Administered 2018-01-08 – 2018-01-11 (×6): 20 mg via INTRAVENOUS
  Filled 2018-01-08 (×6): qty 2

## 2018-01-08 MED ORDER — ALPRAZOLAM 0.5 MG PO TABS
0.5000 mg | ORAL_TABLET | Freq: Three times a day (TID) | ORAL | Status: DC | PRN
  Administered 2018-01-08 – 2018-01-09 (×2): 0.5 mg via ORAL
  Filled 2018-01-08 (×2): qty 1

## 2018-01-08 MED ORDER — CEPHALEXIN 500 MG PO CAPS
500.0000 mg | ORAL_CAPSULE | Freq: Three times a day (TID) | ORAL | Status: DC
  Administered 2018-01-08 – 2018-01-11 (×8): 500 mg via ORAL
  Filled 2018-01-08 (×9): qty 1

## 2018-01-08 NOTE — Progress Notes (Signed)
ABG was postponed due to patient being up on bedside commode and being bathed. She was alert, coherent and had an SpO2 of 95% on 3L [place].I attempted to obtain ABG twice and was unsuccessful. Night shift RT was notified and will attempt later.

## 2018-01-08 NOTE — Plan of Care (Signed)
  Problem: Acute Rehab PT Goals(only PT should resolve) Goal: Pt Will Ambulate Outcome: Progressing Flowsheets (Taken 01/08/2018 1419) Pt will Ambulate: 50 feet; with modified independence; with rolling walker   2:20 PM, 01/08/18 Lonell Grandchild, MPT Physical Therapist with [facility] [phone] office [phone] mobile phone

## 2018-01-08 NOTE — Evaluation (Signed)
Physical Therapy Evaluation Patient Details Name: Stephanie Combs MRN: [id number] DOB: [birth year] Today's Date: 01/08/2018   History of Present Illness  Stephanie Combs is a 67 y.o. female with medical history significant of anxiety, history of CAD (s/p DES to LAD and angioplasty to D1 in 05/2017), chronic diastolic CHF, chronic back pain, history of degenerative disc disease, COPD, type 2 diabetes, hypertension, hypothyroidism, chronic pedal edema, morbid obesity, chronic anemia, recurrent syncopal episodes with several admissions this year due to this who is coming to the emergency department after passing out at home followed by a period of confusion.  The patient stated that she does not remember seeing the EMS crew, having dizziness, chest pain, palpitations, diaphoresis or dyspnea prior to these symptoms.  She denies fever, but feels chills.  Her appetite is decreased.  Denies productive cough, wheezing or hemoptysis.  She denies abdominal pain, nausea, emesis, diarrhea, but complains of severe constipation.  Denies dysuria, frequency or hematuria.  No polyuria, polydipsia, polyphagia or blurred vision.    Clinical Impression  Patient functioning near baseline for functional mobility and gait, other than c/o pain/discomfort BLE and feet when walking, no loss of balance, transferred to [facility] to urinate prior to going back to bed.  Patient will benefit from continued physical therapy in hospital and recommended venue below to increase strength, balance, endurance for safe ADLs and gait.     Follow Up Recommendations Home health PT;Supervision - Intermittent    Equipment Recommendations  None recommended by PT    Recommendations for Other Services       Precautions / Restrictions Precautions Precautions: None Restrictions Weight Bearing Restrictions: No      Mobility  Bed Mobility Overal bed mobility: Modified Independent             General bed mobility comments: uses bed  rail  Transfers Overall transfer level: Modified independent                  Ambulation/Gait Ambulation/Gait assistance: Supervision Gait Distance (Feet): 30 Feet Assistive device: Rolling walker (2 wheeled) Gait Pattern/deviations: Decreased step length - right;Decreased step length - left;Decreased stride length Gait velocity: decreased   General Gait Details: slightly labored slow cadence without loss of balnace, limited secondary to c/o discomfort in feet  Stairs            Wheelchair Mobility    Modified Rankin (Stroke Patients Only)       Balance Overall balance assessment: Needs assistance Sitting-balance support: Feet supported;No upper extremity supported Sitting balance-Leahy Scale: Good     Standing balance support: Bilateral upper extremity supported;During functional activity Standing balance-Leahy Scale: Fair Standing balance comment: using RW                             Pertinent Vitals/Pain Pain Assessment: Faces Faces Pain Scale: Hurts a little bit Pain Location: BLE Pain Descriptors / Indicators: Discomfort Pain Intervention(s): Limited activity within patient's tolerance;Monitored during session    Home Living Family/patient expects to be discharged to:: Private residence Living Arrangements: Alone Available Help at Discharge: Family;Friend(s);Available 24 hours/day Type of Home: Mobile home Home Access: Ramped entrance     Home Layout: One level Home Equipment: [place] - 2 wheels;Cane - single point;Shower seat;Bedside commode;Wheelchair - manual      Prior Function Level of Independence: Independent with assistive device(s)         Comments: household gait with RW PRN, uses RW  for community distances     Hand Dominance   Dominant Hand: Right    Extremity/Trunk Assessment   Upper Extremity Assessment Upper Extremity Assessment: Generalized weakness    Lower Extremity Assessment Lower Extremity  Assessment: Generalized weakness       Communication   Communication: No difficulties  Cognition Arousal/Alertness: Awake/alert Behavior During Therapy: WFL for tasks assessed/performed Overall Cognitive Status: Within Functional Limits for tasks assessed                                        General Comments      Exercises     Assessment/Plan    PT Assessment Patient needs continued PT services  PT Problem List Decreased strength;Decreased activity tolerance;Decreased balance;Decreased mobility       PT Treatment Interventions Gait training;Stair training;Functional mobility training;Therapeutic activities;Therapeutic exercise;Patient/family education    PT Goals (Current goals can be found in the Care Plan section)  Acute Rehab PT Goals Patient Stated Goal: return home with friends/family to assist PT Goal Formulation: With patient Time For Goal Achievement: 01/15/18 Potential to Achieve Goals: Good    Frequency Min 3X/week   Barriers to discharge        Co-evaluation               AM-PAC PT "6 Clicks" Daily Activity  Outcome Measure Difficulty turning over in bed (including adjusting bedclothes, sheets and blankets)?: None Difficulty moving from lying on back to sitting on the side of the bed? : None Difficulty sitting down on and standing up from a chair with arms (e.g., wheelchair, bedside commode, etc,.)?: None Help needed moving to and from a bed to chair (including a wheelchair)?: None Help needed walking in hospital room?: A Little Help needed climbing 3-5 steps with a railing? : A Little 6 Click Score: 22    End of Session   Activity Tolerance: Patient tolerated treatment well;Patient limited by fatigue Patient left: in bed;with call bell/phone within reach Nurse Communication: Mobility status PT Visit Diagnosis: Unsteadiness on feet (R26.81);Other abnormalities of gait and mobility (R26.89);Muscle weakness (generalized)  (M62.81)    Time: 8675-4492 PT Time Calculation (min) (ACUTE ONLY): 30 min   Charges:   PT Evaluation $PT Eval Moderate Complexity: 1 Mod PT Treatments $Therapeutic Activity: 23-37 mins        2:18 PM, 01/08/18 Lonell Grandchild, MPT Physical Therapist with [facility] [phone] office [phone] mobile phone

## 2018-01-08 NOTE — Progress Notes (Signed)
Inpatient Diabetes Program Recommendations  AACE/ADA: New Consensus Statement on Inpatient Glycemic Control (2015)  Target Ranges:  Prepandial:   less than 140 mg/dL      Peak postprandial:   less than 180 mg/dL (1-2 hours)      Critically ill patients:  140 - 180 mg/dL   Lab Results  Component Value Date   GLUCAP 210 (H) 01/08/2018   HGBA1C 8.1 (H) 10/13/2017    Review of Glycemic Control Results for Stephanie Combs, Stephanie Combs (MRN [id number]) as of 01/08/2018 09:28  Ref. Range 01/07/2018 09:44 01/07/2018 11:40 01/07/2018 16:27 01/07/2018 22:00 01/08/2018 07:22  Glucose-Capillary Latest Ref Range: 70 - 99 mg/dL 311 (H) 293 (H) 164 (H) 205 (H) 210 (H)   Inpatient Diabetes Program Recommendations:    -Add Novolog 5 units meal coverage tid if eats 50%  Thank you, Bethena Roys E. Shaily Librizzi, RN, MSN, CDE  Diabetes Coordinator Inpatient Glycemic Control Team Team Pager [phone] (8am-5pm) 01/08/2018 9:29 AM

## 2018-01-08 NOTE — Care Management (Addendum)
From home. With [facility] services through [facility]. Pt will DC back home today. [facility] HH rep aware of observation status in hospital. No resumption order needed. Pt also active with [facility].

## 2018-01-08 NOTE — Progress Notes (Signed)
PROGRESS NOTE    Stephanie Combs  PIR:[id number] DOB: 06/15/1950 DOA: 01/06/2018 PCP: [name], MD    Brief Narrative:  67 year old female with multiple medical problems including coronary artery disease, chronic diastolic CHF, chronic kidney disease, COPD, chronic pedal edema, presents with an episode of hypotension/syncope.  Noted to be hypotensive on arrival to the emergency room.  She is noted to have some significant lower extremity cellulitis and was started on intravenous antibiotics.  She did receive some IV fluids.  She does have a history of orthostatic hypotension.  Continue on intravenous antibiotics for today.  Monitor in the hospital overnight.   Assessment & Plan:   Principal Problem:   Altered mental status Active Problems:   COPD (chronic obstructive pulmonary disease) (HCC)   Morbid obesity (HCC)   HTN (hypertension), benign   Chronic diastolic CHF (congestive heart failure) (HCC)   Hypothyroidism   Personal history of noncompliance with medical treatment, presenting hazards to health   Anxiety   Chronic respiratory failure with hypoxia (HCC)   Cellulitis of right lower extremity   1. Altered mental status/syncope.  Patient did have elevated creatinine on arrival which has improved with some fluids.  She may have some degree of dehydration.  She also has a history of orthostatic hypotension.  She has had an extensive cardiac work-up recently and is followed by cardiology as an outpatient.  EEG has been performed and has been unrevealing. Echocardiogram was recently performed did not show any clear cause of syncope. Today she is very somonlent. This may be related to polypharmacy. Will decrease dosing of xanax, opiates and gabapentin. Check ammonia and ABG. 2. Cellulitis.  Started on intravenous antibiotics.  Overall erythema lower extremities is improving. Transition antibiotics to oral keflex 3. Chronic diastolic CHF.  Patient does have chronic lower extremity edema.   I suspect this may be contributing to some of the redness and pain in her legs. Will give a trial of intravenous lasix. 4. Diabetes.  Currently on Levemir.  Continue on sliding scale insulin.  Blood sugars are still elevated, but stable. 5. COPD with chronic respiratory failure.  No signs of wheezing or worsening shortness of breath.  Continue current treatments. 6. Hypothyroidism.  Continue on home dose of Synthroid 7. Chronic kidney disease stage III.  Creatinine appears to be improved back to baseline.  Continue to monitor. 8. Anemia chronic kidney disease.  No signs of bleeding at this time.  Continue to follow hemoglobin.   DVT prophylaxis: Heparin Code Status: Full code Family Communication: No family present Disposition Plan: Discharge home once improved   Consultants:     Procedures:   EEG: No epileptiform discharges.  Antimicrobials:   Clindamycin 8/28 >8/30  Levofloxacin 8/28 > 8/29  Keflex 8/30>   Subjective: Says she does not feel well today. She is lethargic. Says she is having some stomach pain today. Her legs continue to hurt. History is difficult since she quickly falls back asleep after each question  Objective: Vitals:   01/08/18 0548 01/08/18 0813 01/08/18 1036 01/08/18 1512  BP: (!) 124/49   (!) 121/42  Pulse: 87   66  Resp:    18  Temp: 98.4 F (36.9 C)  98 F (36.7 C)   TempSrc: Oral  Oral   SpO2: 100% 97%  100%  Weight:      Height:        Intake/Output Summary (Last 24 hours) at 01/08/2018 1857 Last data filed at 01/08/2018 1700 Gross per 24  hour  Intake 891.67 ml  Output 1800 ml  Net -908.33 ml   Filed Weights   01/06/18 1729  Weight: 127.5 kg    Examination:  General exam: lethargic. Briefly wakes up to voice, but quickly falls back asleep Respiratory system: Clear to auscultation. Respiratory effort normal. Cardiovascular system:RRR. No murmurs, rubs, gallops. Gastrointestinal system: Abdomen is nondistended, soft and  nontender. No organomegaly or masses felt. Normal bowel sounds heard. Central nervous system: somnolent. No focal neurological deficits. Extremities: erythema noted to LE bilaterally. 1-2+ edema bilaterally Skin: No rashes, lesions or ulcers Psychiatry: somnolent, minimal conversation .     Data Reviewed: I have personally reviewed following labs and imaging studies  CBC: Recent Labs  Lab 01/06/18 1820 01/07/18 0427 01/08/18 0521  WBC 5.5 5.1 5.2  NEUTROABS 3.3  --   --   HGB 8.8* 8.4* 8.4*  HCT 29.6* 28.5* 28.9*  MCV 90.5 90.8 91.7  PLT 300 248 277   Basic Metabolic Panel: Recent Labs  Lab 01/06/18 1820 01/07/18 0427 01/08/18 0521  NA 138 138 138  K 5.1 4.3 5.0  CL 107 105 106  CO2 26 27 28   GLUCOSE 226* 302* 172*  BUN 32* 31* 30*  CREATININE 1.65* 1.38* 1.28*  CALCIUM 8.6* 8.3* 8.6*   GFR: Estimated Creatinine Clearance: 57.5 mL/min (A) (by C-G formula based on SCr of 1.28 mg/dL (H)). Liver Function Tests: Recent Labs  Lab 01/06/18 1820  AST 17  ALT 12  ALKPHOS 89  BILITOT 0.4  PROT 6.4*  ALBUMIN 3.4*   No results for input(s): LIPASE, AMYLASE in the last 168 hours. No results for input(s): AMMONIA in the last 168 hours. Coagulation Profile: Recent Labs  Lab 01/06/18 1820  INR 0.98   Cardiac Enzymes: Recent Labs  Lab 01/06/18 1820  TROPONINI <0.03   BNP (last 3 results) No results for input(s): PROBNP in the last 8760 hours. HbA1C: No results for input(s): HGBA1C in the last 72 hours. CBG: Recent Labs  Lab 01/07/18 1627 01/07/18 2200 01/08/18 0722 01/08/18 1106 01/08/18 1617  GLUCAP 164* 205* 210* 203* 225*   Lipid Profile: No results for input(s): CHOL, HDL, LDLCALC, TRIG, CHOLHDL, LDLDIRECT in the last 72 hours. Thyroid Function Tests: No results for input(s): TSH, T4TOTAL, FREET4, T3FREE, THYROIDAB in the last 72 hours. Anemia Panel: No results for input(s): VITAMINB12, FOLATE, FERRITIN, TIBC, IRON, RETICCTPCT in the last 72  hours. Sepsis Labs: Recent Labs  Lab 01/06/18 1906 01/06/18 2005  LATICACIDVEN 1.2 0.8    Recent Results (from the past 240 hour(s))  Urine culture     Status: None   Collection Time: 01/06/18  6:20 PM  Result Value Ref Range Status   Specimen Description   Final    URINE, CATHETERIZED Performed at [facility], [address], [place], [place] [address]    Special Requests   Final    NONE Performed at [facility], [address], [place], [place] [address]    Culture   Final    NO GROWTH Performed at [facility], [place] [address] [place], [place] [address]    Report Status 01/08/2018 FINAL  Final  Culture, blood (routine x 2)     Status: None (Preliminary result)   Collection Time: 01/07/18  6:38 AM  Result Value Ref Range Status   Specimen Description BLOOD LEFT ARM  Final   Special Requests   Final    BOTTLES DRAWN AEROBIC AND ANAEROBIC Blood Culture adequate volume  Culture   Final    NO GROWTH 1 DAY Performed at [facility], [address], [place], [place] [address]    Report Status PENDING  Incomplete  Culture, blood (routine x 2)     Status: None (Preliminary result)   Collection Time: 01/07/18  6:38 AM  Result Value Ref Range Status   Specimen Description BLOOD RIGHT HAND  Final   Special Requests   Final    BOTTLES DRAWN AEROBIC AND ANAEROBIC Blood Culture adequate volume   Culture   Final    NO GROWTH 1 DAY Performed at [facility], [address], [place], [place] [address]    Report Status PENDING  Incomplete  MRSA PCR Screening     Status: None   Collection Time: 01/07/18  1:06 PM  Result Value Ref Range Status   MRSA by PCR NEGATIVE NEGATIVE Final    Comment:        The GeneXpert MRSA Assay (FDA approved for NASAL specimens only), is one component of a comprehensive MRSA colonization surveillance program. It is not intended to diagnose MRSA infection nor to guide or monitor treatment for MRSA infections. Performed at [facility], [address], [place], [place] [address]          Radiology Studies: Dg Chest 2 View  Result Date: 01/06/2018 CLINICAL DATA:  67 year old with recent progressively worsening generalized weakness and falls. EXAM: CHEST - 2 VIEW COMPARISON:  11/24/2017, 10/15/2017 and earlier. FINDINGS: AP ERECT and LATERAL images were obtained. Cardiac silhouette upper normal in size to slightly enlarged for AP technique, unchanged. Thoracic aorta mildly atherosclerotic, unchanged. Hilar and mediastinal contours otherwise unremarkable. Linear atelectasis in the RIGHT LOWER LOBE and RIGHT MIDDLE LOBE. Lungs otherwise clear. No localized airspace consolidation. No pleural effusions. No pneumothorax. Normal pulmonary vascularity. Degenerative changes and DISH involving the thoracic spine. IMPRESSION: Mild linear atelectasis involving the RIGHT LOWER LOBE and RIGHT MIDDLE LOBE. No acute cardiopulmonary disease otherwise. Electronically Signed   By: Evangeline Dakin M.D.   On: 01/06/2018 19:07        Scheduled Meds: . aspirin EC  81 mg Oral Daily  . atorvastatin  80 mg Oral q morning - 10a  . cephALEXin  500 mg Oral Q8H  . cholecalciferol  5,000 Units Oral Daily  . clopidogrel  75 mg Oral Daily  . docusate sodium  300 mg Oral Daily  . ferrous sulfate  325 mg Oral Q breakfast  . furosemide  20 mg Intravenous BID  . heparin  5,000 Units Subcutaneous Q8H  . insulin aspart  0-20 Units Subcutaneous TID WC  . insulin aspart  0-5 Units Subcutaneous QHS  . insulin detemir  40 Units Subcutaneous Daily  . levothyroxine  175 mcg Oral QAC breakfast  . milk and molasses  1 enema Rectal Once  . nystatin   Topical BID  . pantoprazole  40 mg Oral BID  . topiramate  75 mg Oral Daily  . umeclidinium-vilanterol  1 puff Inhalation Daily   Continuous Infusions:    LOS: 0 days    Time spent: 52mins    Kathie Dike, MD [facility] Pager [phone]  If 7PM-7AM, please contact  night-coverage [email or web address] Password [facility] 01/08/2018, 6:57 PM

## 2018-01-09 DIAGNOSIS — R4182 Altered mental status, unspecified: Secondary | ICD-10-CM | POA: Diagnosis not present

## 2018-01-09 DIAGNOSIS — R531 Weakness: Secondary | ICD-10-CM | POA: Diagnosis not present

## 2018-01-09 DIAGNOSIS — F419 Anxiety disorder, unspecified: Secondary | ICD-10-CM | POA: Diagnosis not present

## 2018-01-09 DIAGNOSIS — L03115 Cellulitis of right lower limb: Secondary | ICD-10-CM | POA: Diagnosis not present

## 2018-01-09 DIAGNOSIS — I959 Hypotension, unspecified: Secondary | ICD-10-CM

## 2018-01-09 LAB — GLUCOSE, CAPILLARY
Glucose-Capillary: 265 mg/dL — ABNORMAL HIGH (ref 70–99)
Glucose-Capillary: 216 mg/dL — ABNORMAL HIGH (ref 70–99)
Glucose-Capillary: 154 mg/dL — ABNORMAL HIGH (ref 70–99)
Glucose-Capillary: 267 mg/dL — ABNORMAL HIGH (ref 70–99)

## 2018-01-09 LAB — BASIC METABOLIC PANEL
Anion gap: 9 (ref 5–15)
BUN: 27 mg/dL — ABNORMAL HIGH (ref 8–23)
CO2: 27 mmol/L (ref 22–32)
Calcium: 8.5 mg/dL — ABNORMAL LOW (ref 8.9–10.3)
Chloride: 101 mmol/L (ref 98–111)
Creatinine, Ser: 1.19 mg/dL — ABNORMAL HIGH (ref 0.44–1.00)
GFR calc Af Amer: 53 mL/min — ABNORMAL LOW (ref >60)
GFR calc non Af Amer: 46 mL/min — ABNORMAL LOW (ref >60)
Glucose, Bld: 291 mg/dL — ABNORMAL HIGH (ref 70–99)
Potassium: 4.5 mmol/L (ref 3.5–5.1)
Sodium: 137 mmol/L (ref 135–145)

## 2018-01-09 LAB — CBC
HCT: 29.7 % — ABNORMAL LOW (ref 36.0–46.0)
Hemoglobin: 8.8 g/dL — ABNORMAL LOW (ref 12.0–15.0)
MCH: 26.5 pg (ref 26.0–34.0)
MCHC: 29.6 g/dL — ABNORMAL LOW (ref 30.0–36.0)
MCV: 89.5 fL (ref 78.0–100.0)
Platelets: 304 10*3/uL (ref 150–400)
RBC: 3.32 MIL/uL — ABNORMAL LOW (ref 3.87–5.11)
RDW: 14.6 % (ref 11.5–15.5)
WBC: 5.5 10*3/uL (ref 4.0–10.5)

## 2018-01-09 LAB — MAGNESIUM: Magnesium: 1.9 mg/dL (ref 1.7–2.4)

## 2018-01-09 LAB — TSH: TSH: 8.354 u[IU]/mL — ABNORMAL HIGH (ref 0.350–4.500)

## 2018-01-09 LAB — PHOSPHORUS: Phosphorus: 4.5 mg/dL (ref 2.5–4.6)

## 2018-01-09 MED ORDER — ALPRAZOLAM 1 MG PO TABS
1.0000 mg | ORAL_TABLET | Freq: Three times a day (TID) | ORAL | Status: DC | PRN
  Administered 2018-01-09 – 2018-01-11 (×5): 1 mg via ORAL
  Filled 2018-01-09 (×5): qty 1

## 2018-01-09 MED ORDER — GABAPENTIN 300 MG PO CAPS
300.0000 mg | ORAL_CAPSULE | Freq: Three times a day (TID) | ORAL | Status: DC
  Administered 2018-01-09 – 2018-01-11 (×6): 300 mg via ORAL
  Filled 2018-01-09 (×6): qty 1

## 2018-01-09 MED ORDER — POTASSIUM CHLORIDE CRYS ER 20 MEQ PO TBCR
20.0000 meq | EXTENDED_RELEASE_TABLET | Freq: Two times a day (BID) | ORAL | Status: DC
  Administered 2018-01-09 – 2018-01-11 (×4): 20 meq via ORAL
  Filled 2018-01-09 (×4): qty 1

## 2018-01-09 MED ORDER — OXYCODONE HCL 5 MG PO TABS
5.0000 mg | ORAL_TABLET | Freq: Four times a day (QID) | ORAL | Status: DC | PRN
  Administered 2018-01-09 – 2018-01-11 (×6): 5 mg via ORAL
  Filled 2018-01-09 (×6): qty 1

## 2018-01-09 MED ORDER — OXYCODONE-ACETAMINOPHEN 5-325 MG PO TABS
1.0000 | ORAL_TABLET | Freq: Four times a day (QID) | ORAL | Status: DC | PRN
  Administered 2018-01-09 – 2018-01-11 (×6): 1 via ORAL
  Filled 2018-01-09 (×6): qty 1

## 2018-01-09 NOTE — Progress Notes (Signed)
PROGRESS NOTE    Stephanie Combs  NWG:[id number] DOB: 1950/10/18 DOA: 01/06/2018 PCP: [name], MD    Brief Narrative:  67 year old female with multiple medical problems including coronary artery disease, chronic diastolic CHF, chronic kidney disease, COPD, chronic pedal edema, presents with an episode of hypotension/syncope.  Noted to be hypotensive on arrival to the emergency room.  She was noted to have some significant lower extremity cellulitis and was started on intravenous antibiotics.  Overall erythema is better.  Due to her significant lower extremity edema, she is being treated for acute on chronic diastolic heart failure with intravenous Lasix.   Assessment & Plan:   Principal Problem:   Altered mental status Active Problems:   COPD (chronic obstructive pulmonary disease) (HCC)   Morbid obesity (HCC)   HTN (hypertension), benign   Chronic diastolic CHF (congestive heart failure) (HCC)   Hypothyroidism   Personal history of noncompliance with medical treatment, presenting hazards to health   Anxiety   Chronic respiratory failure with hypoxia (HCC)   Cellulitis of right lower extremity   1. Altered mental status/syncope.  Patient did have elevated creatinine on arrival which has improved with some fluids.  She may have some degree of dehydration.  She also has a history of orthostatic hypotension.  She has had an extensive cardiac work-up recently and is followed by cardiology as an outpatient.  EEG has been performed and has been unrevealing. Echocardiogram was recently performed did not show any clear cause of syncope.  Mental status appears to be better today.  Ammonia and ABG unrevealing.  Her medication doses were decreased, but she reports being on these medications for several years.  Review of prior discharge summaries confirms this.  We will continue to monitor 2. Cellulitis.  She was treated with intravenous antibiotics and overall erythema appears stable.  She is  been transitioned to oral antibiotics 3. Acute on chronic chronic diastolic CHF.  Patient does have  lower extremity edema.  I suspect this may be contributing to some of the redness and pain in her legs. Started on IV Lasix with excellent urine output.  She still has significant edema.  We will continue current treatments. 4. Diabetes.  Currently on Levemir.  Continue on sliding scale insulin.  Blood sugars are still elevated, but stable. 5. COPD with chronic respiratory failure.  No signs of wheezing or worsening shortness of breath.  Continue current treatments. 6. Hypothyroidism.  Continue on home dose of Synthroid 7. Chronic kidney disease stage III.  Creatinine appears to be improved back to baseline.  Continue to monitor. 8. Anemia chronic kidney disease.  No signs of bleeding at this time.  Continue to follow hemoglobin.   DVT prophylaxis: Heparin Code Status: Full code Family Communication: No family present Disposition Plan: Discharge home once improved   Consultants:     Procedures:   EEG: No epileptiform discharges.  Antimicrobials:   Clindamycin 8/28 >8/30  Levofloxacin 8/28 > 8/29  Keflex 8/30>   Subjective: She is feeling very cold today.  Complains of diffuse muscle cramps throughout her body overnight and into today.  Continues to have pain in her legs bilaterally. Objective: Vitals:   01/08/18 2123 01/09/18 0537 01/09/18 0749 01/09/18 1505  BP: (!) 149/63 (!) 128/47  (!) 128/49  Pulse: (!) 57 72  66  Resp: 20 20  16   Temp: 98 F (36.7 C) 98.4 F (36.9 C)  97.7 F (36.5 C)  TempSrc: Oral Oral  Oral  SpO2: 100% 100%  99% 99%  Weight:      Height:        Intake/Output Summary (Last 24 hours) at 01/09/2018 1719 Last data filed at 01/09/2018 1525 Gross per 24 hour  Intake 720 ml  Output 4400 ml  Net -3680 ml   Filed Weights   01/06/18 1729  Weight: 127.5 kg    Examination:  General exam: Awake and alert, not in any distress Respiratory  system: Mild crackles at bases. Respiratory effort normal. Cardiovascular system:RRR. No murmurs, rubs, gallops. Gastrointestinal system: Abdomen is nondistended, soft and nontender. No organomegaly or masses felt. Normal bowel sounds heard. Central nervous system: No focal neurological deficits. Extremities: erythema noted to LE bilaterally, improving. 1-2+ edema bilaterally Skin: No rashes, lesions or ulcers Psychiatry: awake and alert. Participates in conversation .     Data Reviewed: I have personally reviewed following labs and imaging studies  CBC: Recent Labs  Lab 01/06/18 1820 01/07/18 0427 01/08/18 0521 01/09/18 0618  WBC 5.5 5.1 5.2 5.5  NEUTROABS 3.3  --   --   --   HGB 8.8* 8.4* 8.4* 8.8*  HCT 29.6* 28.5* 28.9* 29.7*  MCV 90.5 90.8 91.7 89.5  PLT 300 248 268 361   Basic Metabolic Panel: Recent Labs  Lab 01/06/18 1820 01/07/18 0427 01/08/18 0521 01/09/18 0618  NA 138 138 138 137  K 5.1 4.3 5.0 4.5  CL 107 105 106 101  CO2 26 27 28 27   GLUCOSE 226* 302* 172* 291*  BUN 32* 31* 30* 27*  CREATININE 1.65* 1.38* 1.28* 1.19*  CALCIUM 8.6* 8.3* 8.6* 8.5*   GFR: Estimated Creatinine Clearance: 61.9 mL/min (A) (by C-G formula based on SCr of 1.19 mg/dL (H)). Liver Function Tests: Recent Labs  Lab 01/06/18 1820  AST 17  ALT 12  ALKPHOS 89  BILITOT 0.4  PROT 6.4*  ALBUMIN 3.4*   No results for input(s): LIPASE, AMYLASE in the last 168 hours. Recent Labs  Lab 01/08/18 1909  AMMONIA 16   Coagulation Profile: Recent Labs  Lab 01/06/18 1820  INR 0.98   Cardiac Enzymes: Recent Labs  Lab 01/06/18 1820  TROPONINI <0.03   BNP (last 3 results) No results for input(s): PROBNP in the last 8760 hours. HbA1C: No results for input(s): HGBA1C in the last 72 hours. CBG: Recent Labs  Lab 01/08/18 1617 01/08/18 2125 01/09/18 0722 01/09/18 1117 01/09/18 1608  GLUCAP 225* 207* 265* 216* 154*   Lipid Profile: No results for input(s): CHOL, HDL,  LDLCALC, TRIG, CHOLHDL, LDLDIRECT in the last 72 hours. Thyroid Function Tests: No results for input(s): TSH, T4TOTAL, FREET4, T3FREE, THYROIDAB in the last 72 hours. Anemia Panel: No results for input(s): VITAMINB12, FOLATE, FERRITIN, TIBC, IRON, RETICCTPCT in the last 72 hours. Sepsis Labs: Recent Labs  Lab 01/06/18 1906 01/06/18 2005  LATICACIDVEN 1.2 0.8    Recent Results (from the past 240 hour(s))  Urine culture     Status: None   Collection Time: 01/06/18  6:20 PM  Result Value Ref Range Status   Specimen Description   Final    URINE, CATHETERIZED Performed at [facility], [address], [place], [place] [address]    Special Requests   Final    NONE Performed at [facility], [address] [place], [place] [address]    Culture   Final    NO GROWTH Performed at [facility], [place] [address], [place],  [address]    Report Status 01/08/2018 FINAL  Final  Culture, blood (  routine x 2)     Status: None (Preliminary result)   Collection Time: 01/07/18  6:38 AM  Result Value Ref Range Status   Specimen Description BLOOD LEFT ARM  Final   Special Requests   Final    BOTTLES DRAWN AEROBIC AND ANAEROBIC Blood Culture adequate volume   Culture   Final    NO GROWTH 2 DAYS Performed at [facility], [address] [place], [place] [address]    Report Status PENDING  Incomplete  Culture, blood (routine x 2)     Status: None (Preliminary result)   Collection Time: 01/07/18  6:38 AM  Result Value Ref Range Status   Specimen Description BLOOD RIGHT HAND  Final   Special Requests   Final    BOTTLES DRAWN AEROBIC AND ANAEROBIC Blood Culture adequate volume   Culture   Final    NO GROWTH 2 DAYS Performed at [facility], [address] [place], [place] [address]    Report Status PENDING  Incomplete  MRSA PCR Screening     Status: None   Collection Time: 01/07/18  1:06 PM  Result Value Ref Range Status   MRSA by PCR NEGATIVE NEGATIVE Final    Comment:          The GeneXpert MRSA Assay (FDA approved for NASAL specimens only), is one component of a comprehensive MRSA colonization surveillance program. It is not intended to diagnose MRSA infection nor to guide or monitor treatment for MRSA infections. Performed at [facility], [address] [place], [place] [address]          Radiology Studies: No results found.      Scheduled Meds: . aspirin EC  81 mg Oral Daily  . atorvastatin  80 mg Oral q morning - 10a  . cephALEXin  500 mg Oral Q8H  . cholecalciferol  5,000 Units Oral Daily  . clopidogrel  75 mg Oral Daily  . docusate sodium  300 mg Oral Daily  . ferrous sulfate  325 mg Oral Q breakfast  . furosemide  20 mg Intravenous BID  . gabapentin  300 mg Oral TID  . heparin  5,000 Units Subcutaneous Q8H  . insulin aspart  0-20 Units Subcutaneous TID WC  . insulin aspart  0-5 Units Subcutaneous QHS  . insulin detemir  40 Units Subcutaneous Daily  . levothyroxine  175 mcg Oral QAC breakfast  . milk and molasses  1 enema Rectal Once  . nystatin   Topical BID  . pantoprazole  40 mg Oral BID  . potassium chloride  20 mEq Oral BID  . topiramate  75 mg Oral Daily  . umeclidinium-vilanterol  1 puff Inhalation Daily   Continuous Infusions:    LOS: 0 days    Time spent: 23mins    Kathie Dike, MD [facility] Pager [phone]  If 7PM-7AM, please contact night-coverage [email or web address] Password [facility] 01/09/2018, 5:19 PM

## 2018-01-10 DIAGNOSIS — Z9981 Dependence on supplemental oxygen: Secondary | ICD-10-CM | POA: Diagnosis not present

## 2018-01-10 DIAGNOSIS — I2510 Atherosclerotic heart disease of native coronary artery without angina pectoris: Secondary | ICD-10-CM | POA: Diagnosis present

## 2018-01-10 DIAGNOSIS — I5033 Acute on chronic diastolic (congestive) heart failure: Secondary | ICD-10-CM | POA: Diagnosis present

## 2018-01-10 DIAGNOSIS — Z823 Family history of stroke: Secondary | ICD-10-CM | POA: Diagnosis not present

## 2018-01-10 DIAGNOSIS — Z6841 Body Mass Index (BMI) 40.0 and over, adult: Secondary | ICD-10-CM | POA: Diagnosis not present

## 2018-01-10 DIAGNOSIS — G8929 Other chronic pain: Secondary | ICD-10-CM | POA: Diagnosis present

## 2018-01-10 DIAGNOSIS — E039 Hypothyroidism, unspecified: Secondary | ICD-10-CM | POA: Diagnosis present

## 2018-01-10 DIAGNOSIS — Z8249 Family history of ischemic heart disease and other diseases of the circulatory system: Secondary | ICD-10-CM | POA: Diagnosis not present

## 2018-01-10 DIAGNOSIS — R55 Syncope and collapse: Secondary | ICD-10-CM | POA: Diagnosis present

## 2018-01-10 DIAGNOSIS — I130 Hypertensive heart and chronic kidney disease with heart failure and stage 1 through stage 4 chronic kidney disease, or unspecified chronic kidney disease: Secondary | ICD-10-CM | POA: Diagnosis present

## 2018-01-10 DIAGNOSIS — R1084 Generalized abdominal pain: Secondary | ICD-10-CM | POA: Diagnosis present

## 2018-01-10 DIAGNOSIS — J449 Chronic obstructive pulmonary disease, unspecified: Secondary | ICD-10-CM | POA: Diagnosis present

## 2018-01-10 DIAGNOSIS — F419 Anxiety disorder, unspecified: Secondary | ICD-10-CM | POA: Diagnosis present

## 2018-01-10 DIAGNOSIS — R110 Nausea: Secondary | ICD-10-CM | POA: Diagnosis present

## 2018-01-10 DIAGNOSIS — J9611 Chronic respiratory failure with hypoxia: Secondary | ICD-10-CM | POA: Diagnosis present

## 2018-01-10 DIAGNOSIS — D631 Anemia in chronic kidney disease: Secondary | ICD-10-CM | POA: Diagnosis present

## 2018-01-10 DIAGNOSIS — E1122 Type 2 diabetes mellitus with diabetic chronic kidney disease: Secondary | ICD-10-CM | POA: Diagnosis present

## 2018-01-10 DIAGNOSIS — Z955 Presence of coronary angioplasty implant and graft: Secondary | ICD-10-CM | POA: Diagnosis not present

## 2018-01-10 DIAGNOSIS — N183 Chronic kidney disease, stage 3 (moderate): Secondary | ICD-10-CM | POA: Diagnosis present

## 2018-01-10 DIAGNOSIS — L0390 Cellulitis, unspecified: Secondary | ICD-10-CM | POA: Diagnosis present

## 2018-01-10 DIAGNOSIS — Z833 Family history of diabetes mellitus: Secondary | ICD-10-CM | POA: Diagnosis not present

## 2018-01-10 DIAGNOSIS — R531 Weakness: Secondary | ICD-10-CM | POA: Diagnosis present

## 2018-01-10 DIAGNOSIS — M549 Dorsalgia, unspecified: Secondary | ICD-10-CM | POA: Diagnosis present

## 2018-01-10 DIAGNOSIS — R4182 Altered mental status, unspecified: Secondary | ICD-10-CM | POA: Diagnosis not present

## 2018-01-10 DIAGNOSIS — L03115 Cellulitis of right lower limb: Secondary | ICD-10-CM | POA: Diagnosis present

## 2018-01-10 DIAGNOSIS — I5032 Chronic diastolic (congestive) heart failure: Secondary | ICD-10-CM | POA: Diagnosis not present

## 2018-01-10 LAB — CBC
HCT: 28.5 % — ABNORMAL LOW (ref 36.0–46.0)
Hemoglobin: 8.8 g/dL — ABNORMAL LOW (ref 12.0–15.0)
MCH: 27.3 pg (ref 26.0–34.0)
MCHC: 30.9 g/dL (ref 30.0–36.0)
MCV: 88.5 fL (ref 78.0–100.0)
Platelets: 298 10*3/uL (ref 150–400)
RBC: 3.22 MIL/uL — ABNORMAL LOW (ref 3.87–5.11)
RDW: 14.8 % (ref 11.5–15.5)
WBC: 5.9 10*3/uL (ref 4.0–10.5)

## 2018-01-10 LAB — COMPREHENSIVE METABOLIC PANEL
ALT: 12 U/L (ref 0–44)
AST: 16 U/L (ref 15–41)
Albumin: 3.1 g/dL — ABNORMAL LOW (ref 3.5–5.0)
Alkaline Phosphatase: 83 U/L (ref 38–126)
Anion gap: 7 (ref 5–15)
BUN: 28 mg/dL — ABNORMAL HIGH (ref 8–23)
CO2: 29 mmol/L (ref 22–32)
Calcium: 8.5 mg/dL — ABNORMAL LOW (ref 8.9–10.3)
Chloride: 101 mmol/L (ref 98–111)
Creatinine, Ser: 1.19 mg/dL — ABNORMAL HIGH (ref 0.44–1.00)
GFR calc Af Amer: 53 mL/min — ABNORMAL LOW (ref >60)
GFR calc non Af Amer: 46 mL/min — ABNORMAL LOW (ref >60)
Glucose, Bld: 271 mg/dL — ABNORMAL HIGH (ref 70–99)
Potassium: 4.2 mmol/L (ref 3.5–5.1)
Sodium: 137 mmol/L (ref 135–145)
Total Bilirubin: 0.5 mg/dL (ref 0.3–1.2)
Total Protein: 5.7 g/dL — ABNORMAL LOW (ref 6.5–8.1)

## 2018-01-10 LAB — GLUCOSE, CAPILLARY
Glucose-Capillary: 275 mg/dL — ABNORMAL HIGH (ref 70–99)
Glucose-Capillary: 215 mg/dL — ABNORMAL HIGH (ref 70–99)
Glucose-Capillary: 180 mg/dL — ABNORMAL HIGH (ref 70–99)
Glucose-Capillary: 177 mg/dL — ABNORMAL HIGH (ref 70–99)

## 2018-01-10 NOTE — Progress Notes (Signed)
PROGRESS NOTE    Stephanie Combs  VVO:[id number] DOB: June 26, 1950 DOA: 01/06/2018 PCP: [name], MD    Brief Narrative:  67 year old female with multiple medical problems including coronary artery disease, chronic diastolic CHF, chronic kidney disease, COPD, chronic pedal edema, presents with an episode of hypotension/syncope.  Noted to be hypotensive on arrival to the emergency room.  She was noted to have some significant lower extremity cellulitis and was started on intravenous antibiotics.  Overall erythema is better.  Due to her significant lower extremity edema, she is being treated for acute on chronic diastolic heart failure with intravenous Lasix.   Assessment & Plan:   Principal Problem:   Altered mental status Active Problems:   COPD (chronic obstructive pulmonary disease) (HCC)   Morbid obesity (HCC)   HTN (hypertension), benign   Chronic diastolic CHF (congestive heart failure) (HCC)   Hypothyroidism   Personal history of noncompliance with medical treatment, presenting hazards to health   Anxiety   Chronic respiratory failure with hypoxia (HCC)   Cellulitis of right lower extremity   1. Altered mental status/syncope.  Patient did have elevated creatinine on arrival which has improved with some fluids.  She may have some degree of dehydration.  She also has a history of orthostatic hypotension.  She has had an extensive cardiac work-up recently and is followed by cardiology as an outpatient.  EEG has been performed and has been unrevealing. Echocardiogram was recently performed did not show any clear cause of syncope.  Mental status appears to be better today.  Ammonia and ABG unrevealing.  Her medication doses were decreased, but she reports being on these medications for several years.  Review of prior discharge summaries confirms this.  We will continue to monitor 2. Cellulitis.  She was treated with intravenous antibiotics and overall erythema appears stable.  She is  been transitioned to oral antibiotics 3. Acute on chronic chronic diastolic CHF.  Patient does have  lower extremity edema.  I suspect this may be contributing to some of the redness and pain in her legs. Started on IV Lasix with excellent urine output.  She still has significant edema.  We will continue current treatments. 4. Diabetes.  Currently on Levemir.  Continue on sliding scale insulin.  Blood sugars are still elevated, but stable. 5. COPD with chronic respiratory failure.  No signs of wheezing or worsening shortness of breath.  Continue current treatments. 6. Hypothyroidism.  Continue on home dose of Synthroid.  TSH continues to trend down 7. Chronic kidney disease stage III.  Creatinine appears to be improved back to baseline.  Continue to monitor. 8. Anemia chronic kidney disease.  No signs of bleeding at this time.  Continue to follow hemoglobin.  9. Nausea and abdominal discomfort.  Unclear etiology.  Possibly related to medications.  We will keep the patient on clear liquids.  LFTs are unrevealing.  No right upper quadrant tenderness.  Continue antiemetics   DVT prophylaxis: Heparin Code Status: Full code Family Communication: No family present Disposition Plan: Discharge home once improved   Consultants:     Procedures:   EEG: No epileptiform discharges.  Antimicrobials:   Clindamycin 8/28 >8/30  Levofloxacin 8/28 > 8/29  Keflex 8/30>   Subjective: Patient has various complaints today.  Her main complaint is nausea, but no vomiting.  Describes diffuse abdominal pain, does not localize.  She says that she is having normal bowel movements.  Continues to complain of pain in her legs.  Objective: Vitals:  01/09/18 1505 01/09/18 2246 01/10/18 0655 01/10/18 1501  BP: (!) 128/49 138/72 (!) 123/53 (!) 98/54  Pulse: 66 84 87 66  Resp: 16 20 20 18   Temp: 97.7 F (36.5 C) 98.4 F (36.9 C) 99.1 F (37.3 C) 97.6 F (36.4 C)  TempSrc: Oral Oral Oral Oral  SpO2: 99%  99% 99% 97%  Weight:      Height:        Intake/Output Summary (Last 24 hours) at 01/10/2018 1746 Last data filed at 01/10/2018 1600 Gross per 24 hour  Intake 480 ml  Output 3200 ml  Net -2720 ml   Filed Weights   01/06/18 1729  Weight: 127.5 kg    Examination:  General exam: Alert, awake, oriented x 3 Respiratory system: Clear to auscultation. Respiratory effort normal. Cardiovascular system:RRR. No murmurs, rubs, gallops. Gastrointestinal system: Abdomen is nondistended, soft and nontender. No organomegaly or masses felt. Normal bowel sounds heard. Central nervous system: Alert and oriented. No focal neurological deficits. Extremities: Erythema and swelling in lower extremities is improving Skin: No rashes, lesions or ulcers Psychiatry: Judgement and insight appear normal. Mood & affect appropriate.   .     Data Reviewed: I have personally reviewed following labs and imaging studies  CBC: Recent Labs  Lab 01/06/18 1820 01/07/18 0427 01/08/18 0521 01/09/18 0618 01/10/18 0606  WBC 5.5 5.1 5.2 5.5 5.9  NEUTROABS 3.3  --   --   --   --   HGB 8.8* 8.4* 8.4* 8.8* 8.8*  HCT 29.6* 28.5* 28.9* 29.7* 28.5*  MCV 90.5 90.8 91.7 89.5 88.5  PLT 300 248 268 304 836   Basic Metabolic Panel: Recent Labs  Lab 01/06/18 1820 01/07/18 0427 01/08/18 0521 01/09/18 0618 01/09/18 1733 01/10/18 0606  NA 138 138 138 137  --  137  K 5.1 4.3 5.0 4.5  --  4.2  CL 107 105 106 101  --  101  CO2 26 27 28 27   --  29  GLUCOSE 226* 302* 172* 291*  --  271*  BUN 32* 31* 30* 27*  --  28*  CREATININE 1.65* 1.38* 1.28* 1.19*  --  1.19*  CALCIUM 8.6* 8.3* 8.6* 8.5*  --  8.5*  MG  --   --   --   --  1.9  --   PHOS  --   --   --   --  4.5  --    GFR: Estimated Creatinine Clearance: 61.9 mL/min (A) (by C-G formula based on SCr of 1.19 mg/dL (H)). Liver Function Tests: Recent Labs  Lab 01/06/18 1820 01/10/18 0606  AST 17 16  ALT 12 12  ALKPHOS 89 83  BILITOT 0.4 0.5  PROT 6.4* 5.7*    ALBUMIN 3.4* 3.1*   No results for input(s): LIPASE, AMYLASE in the last 168 hours. Recent Labs  Lab 01/08/18 1909  AMMONIA 16   Coagulation Profile: Recent Labs  Lab 01/06/18 1820  INR 0.98   Cardiac Enzymes: Recent Labs  Lab 01/06/18 1820  TROPONINI <0.03   BNP (last 3 results) No results for input(s): PROBNP in the last 8760 hours. HbA1C: No results for input(s): HGBA1C in the last 72 hours. CBG: Recent Labs  Lab 01/09/18 1608 01/09/18 2243 01/10/18 0733 01/10/18 1123 01/10/18 1635  GLUCAP 154* 267* 275* 215* 180*   Lipid Profile: No results for input(s): CHOL, HDL, LDLCALC, TRIG, CHOLHDL, LDLDIRECT in the last 72 hours. Thyroid Function Tests: Recent Labs    01/09/18 1733  TSH  8.354*   Anemia Panel: No results for input(s): VITAMINB12, FOLATE, FERRITIN, TIBC, IRON, RETICCTPCT in the last 72 hours. Sepsis Labs: Recent Labs  Lab 01/06/18 1906 01/06/18 2005  LATICACIDVEN 1.2 0.8    Recent Results (from the past 240 hour(s))  Urine culture     Status: None   Collection Time: 01/06/18  6:20 PM  Result Value Ref Range Status   Specimen Description   Final    URINE, CATHETERIZED Performed at [facility], [address] [place], [place] [address]    Special Requests   Final    NONE Performed at [facility], [address], [place], [place] [address]    Culture   Final    NO GROWTH Performed at [facility], [place] [address], [place], [place] [address]    Report Status 01/08/2018 FINAL  Final  Culture, blood (routine x 2)     Status: None (Preliminary result)   Collection Time: 01/07/18  6:38 AM  Result Value Ref Range Status   Specimen Description BLOOD LEFT ARM  Final   Special Requests   Final    BOTTLES DRAWN AEROBIC AND ANAEROBIC Blood Culture adequate volume   Culture   Final    NO GROWTH 3 DAYS Performed at [facility], [address], [place], [place] [address]    Report Status PENDING  Incomplete  Culture, blood (routine x 2)      Status: None (Preliminary result)   Collection Time: 01/07/18  6:38 AM  Result Value Ref Range Status   Specimen Description BLOOD RIGHT HAND  Final   Special Requests   Final    BOTTLES DRAWN AEROBIC AND ANAEROBIC Blood Culture adequate volume   Culture   Final    NO GROWTH 3 DAYS Performed at [facility], [address] [place], [place] [address]    Report Status PENDING  Incomplete  MRSA PCR Screening     Status: None   Collection Time: 01/07/18  1:06 PM  Result Value Ref Range Status   MRSA by PCR NEGATIVE NEGATIVE Final    Comment:        The GeneXpert MRSA Assay (FDA approved for NASAL specimens only), is one component of a comprehensive MRSA colonization surveillance program. It is not intended to diagnose MRSA infection nor to guide or monitor treatment for MRSA infections. Performed at [facility], [address], [place], [place] [address]          Radiology Studies: No results found.      Scheduled Meds: . aspirin EC  81 mg Oral Daily  . atorvastatin  80 mg Oral q morning - 10a  . cephALEXin  500 mg Oral Q8H  . cholecalciferol  5,000 Units Oral Daily  . clopidogrel  75 mg Oral Daily  . docusate sodium  300 mg Oral Daily  . ferrous sulfate  325 mg Oral Q breakfast  . furosemide  20 mg Intravenous BID  . gabapentin  300 mg Oral TID  . heparin  5,000 Units Subcutaneous Q8H  . insulin aspart  0-20 Units Subcutaneous TID WC  . insulin aspart  0-5 Units Subcutaneous QHS  . insulin detemir  40 Units Subcutaneous Daily  . levothyroxine  175 mcg Oral QAC breakfast  . nystatin   Topical BID  . pantoprazole  40 mg Oral BID  . potassium chloride  20 mEq Oral BID  . topiramate  75 mg Oral Daily  . umeclidinium-vilanterol  1 puff Inhalation Daily  Continuous Infusions:    LOS: 0 days    Time spent: 61mins    Kathie Dike, MD [facility] Pager [phone]  If 7PM-7AM, please contact night-coverage [email or web address] Password  [facility] 01/10/2018, 5:46 PM

## 2018-01-10 NOTE — Progress Notes (Signed)
Patient has refused cephalexin capsule 500 mg at this time. Pt states "it makes me sick to my stomach" MD made aware.

## 2018-01-10 NOTE — Progress Notes (Signed)
PT Cancellation Note  Patient Details Name: Stephanie Combs MRN: [id number] DOB: 01/14/1951   Cancelled Treatment:    Reason Eval/Treat Not Completed: Patient declined, no reason specified .  Patient declined therapy secondary to c/o nausea and feeling sick, RN aware.  Will check back tomorrow.   11:51 AM, 01/10/18 Lonell Grandchild, MPT Physical Therapist with [facility] 336 [phone] office [phone] mobile phone

## 2018-01-11 LAB — GLUCOSE, CAPILLARY
Glucose-Capillary: 163 mg/dL — ABNORMAL HIGH (ref 70–99)
Glucose-Capillary: 160 mg/dL — ABNORMAL HIGH (ref 70–99)

## 2018-01-11 LAB — BASIC METABOLIC PANEL
Anion gap: 5 (ref 5–15)
BUN: 28 mg/dL — ABNORMAL HIGH (ref 8–23)
CO2: 32 mmol/L (ref 22–32)
Calcium: 8.7 mg/dL — ABNORMAL LOW (ref 8.9–10.3)
Chloride: 101 mmol/L (ref 98–111)
Creatinine, Ser: 1.47 mg/dL — ABNORMAL HIGH (ref 0.44–1.00)
GFR calc Af Amer: 41 mL/min — ABNORMAL LOW (ref >60)
GFR calc non Af Amer: 35 mL/min — ABNORMAL LOW (ref >60)
Glucose, Bld: 166 mg/dL — ABNORMAL HIGH (ref 70–99)
Potassium: 4.4 mmol/L (ref 3.5–5.1)
Sodium: 138 mmol/L (ref 135–145)

## 2018-01-11 MED ORDER — ONDANSETRON HCL 8 MG PO TABS: 8.0000 mg | ORAL_TABLET | Freq: Three times a day (TID) | ORAL | 0 refills | Status: DC | PRN

## 2018-01-11 MED ORDER — CEPHALEXIN 500 MG PO CAPS: 500.0000 mg | ORAL_CAPSULE | Freq: Three times a day (TID) | ORAL | 0 refills | Status: DC

## 2018-01-11 NOTE — Care Management Important Message (Signed)
Important Message  Patient Details  Name: Stephanie Combs MRN: [id number] Date of Birth: 03-Apr-1951   Medicare Important Message Given:  Yes    Sherald Barge, RN 01/11/2018, 12:10 PM

## 2018-01-11 NOTE — Care Management (Signed)
Per MD pt will DC today. [facility] notified of DC today.

## 2018-01-11 NOTE — Discharge Summary (Signed)
Physician Discharge Summary  Stephanie Combs TDV:[id number] DOB: 1950/06/12 DOA: 01/06/2018  PCP: [name], MD  Admit date: 01/06/2018 Discharge date: 01/11/2018  Admitted From: Home Disposition: Home  Recommendations for Outpatient Follow-up:  1. Follow up with PCP in 1-2 weeks 2. Please obtain BMP/CBC in one week 3. Recommend follow-up with endocrinology to discuss low cortisol as well as follow TSH  Home Health: Resuming home health services on discharge Equipment/Devices:  Discharge Condition: Stable CODE STATUS: Full code Diet recommendation: Heart healthy, carb modified  Brief/Interim Summary: 67 year old female with multiple medical problems including coronary artery disease, chronic diastolic CHF, chronic kidney disease, COPD, chronic pedal edema, presents with an episode of hypotension/syncope.  Noted to be hypotensive on arrival to the emergency room.  She was noted to have some significant lower extremity cellulitis and was started on intravenous antibiotics.  Overall erythema is better.  She was also noted to have acute on chronic diastolic heart failure was treated with intravenous Lasix.Marland Kitchen  Discharge Diagnoses:  Principal Problem:   Altered mental status Active Problems:   COPD (chronic obstructive pulmonary disease) (HCC)   Morbid obesity (HCC)   HTN (hypertension), benign   Chronic diastolic CHF (congestive heart failure) (HCC)   Hypothyroidism   Personal history of noncompliance with medical treatment, presenting hazards to health   Anxiety   Chronic respiratory failure with hypoxia (HCC)   Cellulitis of right lower extremity   Cellulitis  1. Altered mental status/syncope.  Patient did have elevated creatinine on arrival which improved with some fluids.  She may have had some degree of intravascular volume depletion.  She also has a history of orthostatic hypotension.  She has had an extensive cardiac work-up recently and is followed by cardiology as an  outpatient.  EEG was unrevealing. Echocardiogram was recently performed and did not show any clear cause of syncope.  Ammonia and ABG unrevealing.    I suspect that polypharmacy is likely playing some role in her lethargy.  Mental status appears to be back to baseline.   2. Cellulitis.  She was treated with intravenous antibiotics and overall erythema appears stable.  She is been transitioned to oral Keflex 3. Acute on chronic chronic diastolic CHF.  Patient did have  lower extremity edema.  I suspect this may be contributing to some of the redness and pain in her legs.  She was treated with IV Lasix with excellent urine output.  Overall net volume status -6.1 L.   Creatinine had mild uptrend with IV diuresis, so Lasix has been changed back to p.o.  Lower extremity edema, although still present has significantly improved.  Likely has an element of chronic edema. 4. Diabetes.    Continue Levemir on discharge.  Blood sugars have been stable 5. COPD with chronic respiratory failure.  No signs of wheezing or worsening shortness of breath.  Continue current treatments. 6. Hypothyroidism.  Continue on home dose of Synthroid.  TSH continues to trend down when compared to prior values 7. Chronic kidney disease stage III.  Creatinine appears to be improved back to baseline.  Continue to monitor. 8. Anemia chronic kidney disease.  No signs of bleeding at this time.  Continue to follow hemoglobin.  9. Nausea and abdominal discomfort.  Unclear etiology.  LFTs found to be unrevealing.  Abdominal exam is benign.  Diet was changed to clear liquids and her symptoms resolved.  She is able to eat now without any vomiting.  She still has some nausea, but wishes to continue  with Zofran.  Discharge Instructions  Discharge Instructions    Diet - low sodium heart healthy   Complete by:  As directed    Increase activity slowly   Complete by:  As directed      Allergies as of 01/11/2018      Reactions   Dilaudid  [hydromorphone Hcl] Itching   Midodrine Hcl Swelling   After one dose had anaphylactic reaction, had to call EMS.   Actifed Cold-allergy [chlorpheniramine-phenylephrine]    "I was sick and red and it didn't agree with me at all"   Doxycycline Nausea And Vomiting   Other Cough   Pt states she is allergic to ragweed and that she starts coughing and sneezing like crazy   Penicillins Hives, Itching   Tolerates Rocephin Has patient had a PCN reaction causing immediate rash, facial/tongue/throat swelling, SOB or lightheadedness with hypotension: Yes Has patient had a PCN reaction causing severe rash involving mucus membranes or skin necrosis: No Has patient had a PCN reaction that required hospitalization Yes Has patient had a PCN reaction occurring within the last 10 years: No If all of the above answers are "NO", then may proceed with Cephalosporin use.   Reglan [metoclopramide] Itching   Valium [diazepam] Itching   Vistaril [hydroxyzine Hcl] Itching      Medication List    TAKE these medications   albuterol (2.5 MG/3ML) 0.083% nebulizer solution Commonly known as:  PROVENTIL Take 2.5 mg by nebulization 2 (two) times daily as needed for wheezing or shortness of breath. What changed:  Another medication with the same name was changed. Make sure you understand how and when to take each.   albuterol 108 (90 Base) MCG/ACT inhaler Commonly known as:  PROVENTIL HFA;VENTOLIN HFA Inhale 1 puff into the lungs every 4 (four) hours as needed. For shortness of breath What changed:  how much to take   ALPRAZolam 1 MG tablet Commonly known as:  XANAX Take 1 mg by mouth 3 (three) times daily as needed for anxiety or sleep.   aspirin EC 81 MG tablet Take 81 mg by mouth daily.   atorvastatin 80 MG tablet Commonly known as:  LIPITOR Take 1 tablet (80 mg total) by mouth daily at 6 PM. What changed:  when to take this   cephALEXin 500 MG capsule Commonly known as:  KEFLEX Take 1 capsule (500  mg total) by mouth 3 (three) times daily.   clopidogrel 75 MG tablet Commonly known as:  PLAVIX Take 1 tablet (75 mg total) by mouth daily.   docusate sodium 100 MG capsule Commonly known as:  COLACE Take 300 mg by mouth daily.   ferrous sulfate 325 (65 FE) MG tablet Take 1 tablet (325 mg total) by mouth daily with breakfast.   FISH OIL PO Take 1 capsule by mouth daily.   furosemide 20 MG tablet Commonly known as:  LASIX Take 1 tablet (20 mg total) by mouth daily. What changed:  when to take this   gabapentin 600 MG tablet Commonly known as:  NEURONTIN Take 600 mg by mouth 4 (four) times daily.   Gerhardt's butt cream Crea Apply 1 application topically 3 (three) times daily.   insulin aspart 100 UNIT/ML injection Commonly known as:  novoLOG Inject 0-9 Units into the skin 3 (three) times daily with meals. What changed:    how much to take  additional instructions   insulin detemir 100 UNIT/ML injection Commonly known as:  LEVEMIR Inject 0.4 mLs (40 Units total) into  the skin at bedtime.   levothyroxine 175 MCG tablet Commonly known as:  SYNTHROID, LEVOTHROID Take 175 mcg by mouth daily.   nystatin powder Commonly known as:  MYCOSTATIN/NYSTOP Apply topically 2 (two) times daily.   ondansetron 8 MG tablet Commonly known as:  ZOFRAN Take 1 tablet (8 mg total) by mouth every 8 (eight) hours as needed for vomiting. What changed:    medication strength  how much to take   oxyCODONE-acetaminophen 10-325 MG tablet Commonly known as:  PERCOCET Take 1 tablet by mouth every 6 (six) hours as needed for pain.   OXYGEN Inhale 2 L into the lungs continuous.   pantoprazole 40 MG tablet Commonly known as:  PROTONIX Take 1 tablet (40 mg total) by mouth daily. What changed:  when to take this   potassium chloride 10 MEQ CR capsule Commonly known as:  MICRO-K Take 20 mEq by mouth 2 (two) times daily. *May take one additional tablet as needed for cramping   SURE  COMFORT INS SYR 1CC/28G 28G X 1/2" 1 ML Misc Generic drug:  INS SYRINGE/NEEDLE 1CC/28G USE TO INJECT INSULIN UP TO 4 TIMES DAILY.   tiZANidine 4 MG tablet Commonly known as:  ZANAFLEX Take 4-8 mg by mouth See admin instructions. 4mg  twice daily and 8mg  at bedtime   topiramate 25 MG tablet Commonly known as:  TOPAMAX Take 75 mg by mouth daily.   umeclidinium-vilanterol 62.5-25 MCG/INH Aepb Commonly known as:  ANORO ELLIPTA Inhale 1 puff into the lungs daily.   VICTOZA 18 MG/3ML Sopn Generic drug:  liraglutide Inject 1.2 mg into the skin daily.   Vitamin D3 5000 units Caps Take 1 capsule (5,000 Units total) by mouth daily.       Allergies  Allergen Reactions  . Dilaudid [Hydromorphone Hcl] Itching  . Midodrine Hcl Swelling    After one dose had anaphylactic reaction, had to call EMS.  Marland Kitchen Actifed Cold-Allergy [Chlorpheniramine-Phenylephrine]     "I was sick and red and it didn't agree with me at all"  . Doxycycline Nausea And Vomiting  . Other Cough    Pt states she is allergic to ragweed and that she starts coughing and sneezing like crazy  . Penicillins Hives and Itching    Tolerates Rocephin Has patient had a PCN reaction causing immediate rash, facial/tongue/throat swelling, SOB or lightheadedness with hypotension: Yes Has patient had a PCN reaction causing severe rash involving mucus membranes or skin necrosis: No Has patient had a PCN reaction that required hospitalization Yes Has patient had a PCN reaction occurring within the last 10 years: No If all of the above answers are "NO", then may proceed with Cephalosporin use.   . Reglan [Metoclopramide] Itching  . Valium [Diazepam] Itching  . Vistaril [Hydroxyzine Hcl] Itching    Consultations:     Procedures/Studies: Dg Chest 2 View  Result Date: 01/06/2018 CLINICAL DATA:  67 year old with recent progressively worsening generalized weakness and falls. EXAM: CHEST - 2 VIEW COMPARISON:  11/24/2017, 10/15/2017  and earlier. FINDINGS: AP ERECT and LATERAL images were obtained. Cardiac silhouette upper normal in size to slightly enlarged for AP technique, unchanged. Thoracic aorta mildly atherosclerotic, unchanged. Hilar and mediastinal contours otherwise unremarkable. Linear atelectasis in the RIGHT LOWER LOBE and RIGHT MIDDLE LOBE. Lungs otherwise clear. No localized airspace consolidation. No pleural effusions. No pneumothorax. Normal pulmonary vascularity. Degenerative changes and DISH involving the thoracic spine. IMPRESSION: Mild linear atelectasis involving the RIGHT LOWER LOBE and RIGHT MIDDLE LOBE. No acute cardiopulmonary disease otherwise.  Electronically Signed   By: Evangeline Dakin M.D.   On: 01/06/2018 19:07   Ct Head Wo Contrast  Result Date: 01/06/2018 CLINICAL DATA:  Altered mental status, weakness and disoriented. Slid from chair to ground. Leg numbness. History of hypertension and diabetes. EXAM: CT HEAD WITHOUT CONTRAST TECHNIQUE: Contiguous axial images were obtained from the base of the skull through the vertex without intravenous contrast. COMPARISON:  CT HEAD October 13, 2017 FINDINGS: Moderate motion degraded examination. BRAIN: No intraparenchymal hemorrhage, mass effect nor midline shift. The ventricles and sulci are normal for age. Patchy to confluent supratentorial white matter hypodensities. No acute large vascular territory infarcts. No abnormal extra-axial fluid collections. Basal cisterns are patent. VASCULAR: Mild calcific atherosclerosis of the carotid siphons. SKULL: No skull fracture. No significant scalp soft tissue swelling. SINUSES/ORBITS: Paranasal sinuses are well aerated. Minimal RIGHT mastoid effusion.The included ocular globes and orbital contents are non-suspicious. OTHER: None. IMPRESSION: 1. Moderate motion degraded examination. No acute intracranial process. 2. Moderate to severe chronic small vessel ischemic changes. Electronically Signed   By: Elon Alas M.D.   On:  01/06/2018 19:00   Ct Lumbar Spine Wo Contrast  Result Date: 01/06/2018 CLINICAL DATA:  Chronic back pain after fall. Decreased responsiveness with fatigue and weakness. EXAM: CT LUMBAR SPINE WITHOUT CONTRAST TECHNIQUE: Multidetector CT imaging of the lumbar spine was performed without intravenous contrast administration. Multiplanar CT image reconstructions were also generated. COMPARISON:  None. FINDINGS: Segmentation: There are 5 non ribbed lumbar vertebrae. Alignment: Maintained lumbar lordosis without pars defects or listhesis. Vertebrae: Osteopenic appearance of the included lower thoracic and lumbar spine without acute fracture or suspicious osseous lesions. 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 Paraspinal and other soft tissues: Moderate aortoiliac atherosclerosis. Nonobstructing left lower pole renal calculus measuring 5 mm. No adenopathy nor abnormal fluid collections. Disc levels: T11-T12 degenerative disc disease with vacuum disc phenomenon and bilateral facet arthropathy, left greater than right. No focal disc herniation or significant foraminal encroachment. T12-L1: No focal disc herniation or significant foraminal encroachment. Minimal facet arthropathy bilaterally. L1-L2: Mild disc bulge and facet hypertrophy. Slight thickening of the ligamentum flavum bilaterally. Mild prominence of posterior epidural fat contributing to mild central canal stenosis. No significant foraminal encroachment. L2-L3: Degenerative disc disease with vacuum disc phenomenon, facet hypertrophy, moderate disc bulge and ligamentum flavum hypertrophy contributing to mild central canal and moderate right foraminal stenosis. Slight left foraminal stenosis. L3-L4: Mild concentric disc bulge with facet hypertrophy. Mild foraminal stenosis. L4-L5: Advanced degenerative disc disease with marked disc flattening and vacuum disc phenomenon, facet hypertrophy and concentric disc bulge contributing to mild foraminal  stenosis. No significant central canal stenosis. L5-S1: Degenerative disc disease with moderate disc flattening, facet hypertrophy but without focal disc herniation. No significant foraminal encroachment. IMPRESSION: 1. 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 2. Concentric moderate disc bulge, facet hypertrophy and ligamentum flavum hypertrophy at L2-3 contributing to mild central canal stenosis with moderate right and mild left foraminal stenosis. Lesser degrees of concentric disc bulging noted at L3-4 at L4-5. 3. No acute nor aggressive osseous abnormality. 4. Aortoiliac atherosclerosis without aneurysm. 5. 5 mm nonobstructing left lower pole renal calculus. Electronically Signed   By: Ashley Royalty M.D.   On: 01/06/2018 19:10       Subjective: Nausea is better today.  Tolerating solid diet without vomiting.  Respiratory status appears to be at baseline.  Discharge Exam: Vitals:   01/10/18 1501 01/10/18 2153 01/11/18 8676  01/11/18 1227  BP: (!) 98/54 (!) 111/50 (!) 125/50 (!) 93/44  Pulse: 66 62 61 77  Resp: 18 20 20 18   Temp: 97.6 F (36.4 C) 98.6 F (37 C) 98.6 F (37 C) 98.2 F (36.8 C)  TempSrc: Oral Oral Oral Oral  SpO2: 97% 97% 100% 100%  Weight:      Height:        General: Pt is alert, awake, not in acute distress Cardiovascular: RRR, S1/S2 +, no rubs, no gallops Respiratory: CTA bilaterally, no wheezing, no rhonchi Abdominal: Soft, NT, ND, bowel sounds + Extremities: 1+ edema (chronic) with mild erythema, no cyanosis    The results of significant diagnostics from this hospitalization (including imaging, microbiology, ancillary and laboratory) are listed below for reference.     Microbiology: Recent Results (from the past 240 hour(s))  Urine culture     Status: None   Collection Time: 01/06/18  6:20 PM  Result Value Ref Range Status   Specimen Description   Final    URINE, CATHETERIZED Performed at [facility], [address], [place], [place] [address]    Special Requests   Final    NONE Performed at [facility], [address] [place], [place] [address]    Culture   Final    NO GROWTH Performed at [facility], [place] [address], [place], [place] [address]    Report Status 01/08/2018 FINAL  Final  Culture, blood (routine x 2)     Status: None (Preliminary result)   Collection Time: 01/07/18  6:38 AM  Result Value Ref Range Status   Specimen Description BLOOD LEFT ARM  Final   Special Requests   Final    BOTTLES DRAWN AEROBIC AND ANAEROBIC Blood Culture adequate volume   Culture   Final    NO GROWTH 4 DAYS Performed at [facility], [address], [place], [place] [address]    Report Status PENDING  Incomplete  Culture, blood (routine x 2)     Status: None (Preliminary result)   Collection Time: 01/07/18  6:38 AM  Result Value Ref Range Status   Specimen Description BLOOD RIGHT HAND  Final   Special Requests   Final    BOTTLES DRAWN AEROBIC AND ANAEROBIC Blood Culture adequate volume   Culture   Final    NO GROWTH 4 DAYS Performed at [facility], [address] [place], [place] [address]    Report Status PENDING  Incomplete  MRSA PCR Screening     Status: None   Collection Time: 01/07/18  1:06 PM  Result Value Ref Range Status   MRSA by PCR NEGATIVE NEGATIVE Final    Comment:        The GeneXpert MRSA Assay (FDA approved for NASAL specimens only), is one component of a comprehensive MRSA colonization surveillance program. It is not intended to diagnose MRSA infection nor to guide or monitor treatment for MRSA infections. Performed at [facility], [address] [place], [place] [address]      Labs: BNP (last 3 results) Recent Labs    04/27/17 1500 05/01/17 1000 01/06/18 1820  BNP 188.0* 108.0* 433.2*   Basic Metabolic Panel: Recent Labs  Lab 01/07/18 0427 01/08/18 0521 01/09/18 0618 01/09/18 1733 01/10/18 0606 01/11/18 0506  NA 138 138 137   --  137 138  K 4.3 5.0 4.5  --  4.2 4.4  CL 105 106 101  --  101 101  CO2 27 28 27   --  29 32  GLUCOSE 302* 172* 291*  --  271* 166*  BUN 31* 30* 27*  --  28* 28*  CREATININE 1.38* 1.28* 1.19*  --  1.19* 1.47*  CALCIUM 8.3* 8.6* 8.5*  --  8.5* 8.7*  MG  --   --   --  1.9  --   --   PHOS  --   --   --  4.5  --   --    Liver Function Tests: Recent Labs  Lab 01/06/18 1820 01/10/18 0606  AST 17 16  ALT 12 12  ALKPHOS 89 83  BILITOT 0.4 0.5  PROT 6.4* 5.7*  ALBUMIN 3.4* 3.1*   No results for input(s): LIPASE, AMYLASE in the last 168 hours. Recent Labs  Lab 01/08/18 1909  AMMONIA 16   CBC: Recent Labs  Lab 01/06/18 1820 01/07/18 0427 01/08/18 0521 01/09/18 0618 01/10/18 0606  WBC 5.5 5.1 5.2 5.5 5.9  NEUTROABS 3.3  --   --   --   --   HGB 8.8* 8.4* 8.4* 8.8* 8.8*  HCT 29.6* 28.5* 28.9* 29.7* 28.5*  MCV 90.5 90.8 91.7 89.5 88.5  PLT 300 248 268 304 298   Cardiac Enzymes: Recent Labs  Lab 01/06/18 1820  TROPONINI <0.03   BNP: Invalid input(s): POCBNP CBG: Recent Labs  Lab 01/10/18 1123 01/10/18 1635 01/10/18 2149 01/11/18 0749 01/11/18 1142  GLUCAP 215* 180* 177* 163* 160*   D-Dimer No results for input(s): DDIMER in the last 72 hours. Hgb A1c No results for input(s): HGBA1C in the last 72 hours. Lipid Profile No results for input(s): CHOL, HDL, LDLCALC, TRIG, CHOLHDL, LDLDIRECT in the last 72 hours. Thyroid function studies Recent Labs    01/09/18 1733  TSH 8.354*   Anemia work up No results for input(s): VITAMINB12, FOLATE, FERRITIN, TIBC, IRON, RETICCTPCT in the last 72 hours. Urinalysis    Component Value Date/Time   COLORURINE YELLOW 01/06/2018 1820   APPEARANCEUR CLEAR 01/06/2018 1820   LABSPEC 1.019 01/06/2018 1820   PHURINE 5.0 01/06/2018 1820   GLUCOSEU NEGATIVE 01/06/2018 1820   HGBUR NEGATIVE 01/06/2018 1820   BILIRUBINUR NEGATIVE 01/06/2018 1820   KETONESUR NEGATIVE 01/06/2018 1820   PROTEINUR NEGATIVE 01/06/2018 1820    UROBILINOGEN 0.2 12/04/2014 0650   NITRITE NEGATIVE 01/06/2018 1820   LEUKOCYTESUR NEGATIVE 01/06/2018 1820   Sepsis Labs Invalid input(s): PROCALCITONIN,  WBC,  LACTICIDVEN Microbiology Recent Results (from the past 240 hour(s))  Urine culture     Status: None   Collection Time: 01/06/18  6:20 PM  Result Value Ref Range Status   Specimen Description   Final    URINE, CATHETERIZED Performed at [facility], [address] [place], [place] [address]    Special Requests   Final    NONE Performed at [facility], [address] [place], [place] [address]    Culture   Final    NO GROWTH Performed at [facility], [place] [address] [place],  [address]    Report Status 01/08/2018 FINAL  Final  Culture, blood (routine x 2)     Status: None (Preliminary result)   Collection Time: 01/07/18  6:38 AM  Result Value Ref Range Status   Specimen Description BLOOD LEFT ARM  Final   Special Requests   Final    BOTTLES DRAWN AEROBIC AND ANAEROBIC Blood Culture adequate volume   Culture   Final    NO GROWTH 4 DAYS Performed at [facility], [address], [place],  [name] [address]    Report Status PENDING  Incomplete  Culture, blood (routine x 2)     Status: None (Preliminary result)   Collection Time: 01/07/18  6:38 AM  Result Value Ref Range Status   Specimen Description BLOOD RIGHT HAND  Final   Special Requests   Final    BOTTLES DRAWN AEROBIC AND ANAEROBIC Blood Culture adequate volume   Culture   Final    NO GROWTH 4 DAYS Performed at [facility], [address], [place], [place] [address]    Report Status PENDING  Incomplete  MRSA PCR Screening     Status: None   Collection Time: 01/07/18  1:06 PM  Result Value Ref Range Status   MRSA by PCR NEGATIVE NEGATIVE Final    Comment:        The GeneXpert MRSA Assay (FDA approved for NASAL specimens only), is one component of a comprehensive MRSA colonization surveillance program. It is not intended to diagnose  MRSA infection nor to guide or monitor treatment for MRSA infections. Performed at [facility], [address] [place], [place] [address]      Time coordinating discharge: 4mins  SIGNED:   Kathie Dike, MD  [facility] 01/11/2018, 2:22 PM Pager   If 7PM-7AM, please contact night-coverage [email or web address] Password [email or web address]

## 2018-01-11 NOTE — Progress Notes (Signed)
Patient states understanding of discharge instructions.  

## 2018-01-11 NOTE — Progress Notes (Signed)
PT Cancellation Note  Patient Details Name: Stephanie Combs MRN: [id number] DOB: 1951/01/26   Cancelled Treatment:    Reason Eval/Treat Not Completed: Other (comment)(Pt stating that she felt nauseous and did not want to participate right now. Will check back at a later time/day.)    Geraldine Solar PT, DPT

## 2018-01-12 ENCOUNTER — Ambulatory Visit: Payer: Medicare HMO | Admitting: *Deleted

## 2018-01-12 ENCOUNTER — Other Ambulatory Visit: Payer: Self-pay | Admitting: *Deleted

## 2018-01-12 LAB — CULTURE, BLOOD (ROUTINE X 2)
Culture: NO GROWTH
Culture: NO GROWTH
Special Requests: ADEQUATE
Special Requests: ADEQUATE

## 2018-01-12 NOTE — Patient Outreach (Signed)
[facility]) Care Management   01/12/2018  VASHON ARCH [birth year] [id number]  Stephanie Combs is an 67 y.o. female  Subjective: Pt was hospitalized last week, due to weakness, abdominal pain, nausea, vomiting. Her stomach still doesn't feel good. She feels she had some kind of food poisoning. She was well diuresed in the hospital #305 to #279. A loss of 26 lbs.  Objective:   Review of Systems  Constitutional: Positive for malaise/fatigue.  HENT: Negative.   Eyes: Negative.   Respiratory: Negative.   Cardiovascular: Positive for leg swelling.  Gastrointestinal: Positive for nausea.  Musculoskeletal: Positive for back pain, joint pain and myalgias.  Skin: Negative.        Thickened skin on feet.  Endo/Heme/Allergies: Bruises/bleeds easily.   BP (!) 120/50   Pulse 85   Resp 18   Wt 279 lb (126.6 kg)   SpO2 97%   BMI 45.03 kg/m   FBS 169  Physical Exam  Constitutional: She is oriented to person, place, and time. She appears well-developed and well-nourished.  Cardiovascular: Normal rate, regular rhythm and normal heart sounds.  Respiratory: Effort normal and breath sounds normal.  GI: Soft. Bowel sounds are normal.  Musculoskeletal:  Limited ROM.  Neurological: She is alert and oriented to person, place, and time.  Skin: Skin is warm and dry.  Psychiatric: She has a normal mood and affect.    Encounter Medications:   Outpatient Encounter Medications as of 01/12/2018  Medication Sig Note  . albuterol (PROAIR HFA) 108 (90 BASE) MCG/ACT inhaler Inhale 1 puff into the lungs every 4 (four) hours as needed. For shortness of breath (Patient taking differently: Inhale 2 puffs into the lungs every 4 (four) hours as needed. For shortness of breath)   . albuterol (PROVENTIL) (2.5 MG/3ML) 0.083% nebulizer solution Take 2.5 mg by nebulization 2 (two) times daily as needed for wheezing or shortness of breath.    . ALPRAZolam (XANAX) 1 MG tablet Take 1 mg by mouth 3  (three) times daily as needed for anxiety or sleep.    Marland Kitchen aspirin EC 81 MG tablet Take 81 mg by mouth daily.   Marland Kitchen atorvastatin (LIPITOR) 80 MG tablet Take 1 tablet (80 mg total) by mouth daily at 6 PM. (Patient taking differently: Take 80 mg by mouth every morning. )   . cephALEXin (KEFLEX) 500 MG capsule Take 1 capsule (500 mg total) by mouth 3 (three) times daily.   . Cholecalciferol (VITAMIN D3) 5000 units CAPS Take 1 capsule (5,000 Units total) by mouth daily.   . clopidogrel (PLAVIX) 75 MG tablet Take 1 tablet (75 mg total) by mouth daily.   Marland Kitchen docusate sodium (COLACE) 100 MG capsule Take 300 mg by mouth daily.   . ferrous sulfate 325 (65 FE) MG tablet Take 1 tablet (325 mg total) by mouth daily with breakfast.   . furosemide (LASIX) 20 MG tablet Take 1 tablet (20 mg total) by mouth daily. (Patient taking differently: Take 20 mg by mouth every 3 (three) days. ) 01/12/2018: Pt is taking 20 mg furosemide once a day as ordered.  . gabapentin (NEURONTIN) 600 MG tablet Take 600 mg by mouth 4 (four) times daily.   . Hydrocortisone (GERHARDT'S BUTT CREAM) CREA Apply 1 application topically 3 (three) times daily. 10/13/2017: Applied to toes  . insulin aspart (NOVOLOG) 100 UNIT/ML injection Inject 0-9 Units into the skin 3 (three) times daily with meals. (Patient taking differently: Inject 1-18 Units into the skin 3 (three)  times daily with meals. Per sliding scale) 01/12/2018: Takes on her own sliding scale and that is that.  . insulin detemir (LEVEMIR) 100 UNIT/ML injection Inject 0.4 mLs (40 Units total) into the skin at bedtime.   Marland Kitchen levothyroxine (SYNTHROID, LEVOTHROID) 175 MCG tablet Take 175 mcg by mouth daily.    Marland Kitchen nystatin (MYCOSTATIN/NYSTOP) powder Apply topically 2 (two) times daily.   . Omega-3 Fatty Acids (FISH OIL PO) Take 1 capsule by mouth daily.    . ondansetron (ZOFRAN) 8 MG tablet Take 1 tablet (8 mg total) by mouth every 8 (eight) hours as needed for vomiting.   Marland Kitchen oxyCODONE-acetaminophen  (PERCOCET) 10-325 MG tablet Take 1 tablet by mouth every 6 (six) hours as needed for pain.   . OXYGEN Inhale 2 L into the lungs continuous.   . pantoprazole (PROTONIX) 40 MG tablet Take 1 tablet (40 mg total) by mouth daily. (Patient taking differently: Take 40 mg by mouth 2 (two) times daily. ) 11/30/2017: Pt states she is taking this bid.  . potassium chloride (MICRO-K) 10 MEQ CR capsule Take 20 mEq by mouth 2 (two) times daily. *May take one additional tablet as needed for cramping   . SURE COMFORT INS SYR 1CC/28G 28G X 1/2" 1 ML MISC USE TO INJECT INSULIN UP TO 4 TIMES DAILY.   Marland Kitchen tiZANidine (ZANAFLEX) 4 MG tablet Take 4-8 mg by mouth See admin instructions. 42m twice daily and 839mat bedtime   . topiramate (TOPAMAX) 25 MG tablet Take 75 mg by mouth daily.    . Marland Kitchenmeclidinium-vilanterol (ANORO ELLIPTA) 62.5-25 MCG/INH AEPB Inhale 1 puff into the lungs daily.   . Marland KitchenICTOZA 18 MG/3ML SOPN Inject 1.2 mg into the skin daily.     No facility-administered encounter medications on file as of 01/12/2018.    Patient was recently discharged from hospital and all medications have been reviewed.  Functional Status:   In your present state of health, do you have any difficulty performing the following activities: 01/06/2018 11/24/2017  Hearing? N N  Vision? N N  Difficulty concentrating or making decisions? N N  Walking or climbing stairs? Y Y  Dressing or bathing? Y N  Doing errands, shopping? Y N  Preparing Food and eating ? - -  Using the Toilet? - -  In the past six months, have you accidently leaked urine? - -  Do you have problems with loss of bowel control? - -  Managing your Medications? - -  Managing your Finances? - -  Housekeeping or managing your Housekeeping? - -  Some recent data might be hidden    Fall/Depression Screening:    Fall Risk  10/26/2017 08/19/2016 06/17/2016  Falls in the past year? Yes No No  Number falls in past yr: 2 or more - -  Injury with Fall? Yes - -  Risk Factor  Category  High Fall Risk - -  Risk for fall due to : History of fall(s) - -  Risk for fall due to: Comment Due to orthostasis - -  Follow up Falls evaluation completed;Falls prevention discussed - -   PHQ 2/9 Scores 10/26/2017 08/19/2016 06/17/2016 06/17/2016  PHQ - 2 Score 1 0 2 0  PHQ- 9 Score - - 11 -   . [facility] Problem One     Most Recent Value  Care Plan Problem One  Medication descrepancies  Role Documenting the Problem One  Care Management Coordinator  Care Plan for Problem One  Not Active  THN CM Short Term Goal #1   Pt to meet with Dr Maudie Mercury and be counseled on her medication compliance and that changes are possible in many of her meds. Please Clarify on next MD office what her current medication should look like.  THN CM Short Term Goal #1 Start Date  11/30/17  THN CM Short Term Goal #1 Met Date  12/08/17  THN CM Short Term Goal #2   Reorganize medications into meds currently taken and meds not currently taking and store these in a safe place. Pt is unwilling to remove these as often she is restated on meds.  THN CM Short Term Goal #2 Met Date  11/10/17  THN CM Short Term Goal #3  Pt to cooperative with health team members trying to assist her over the next month.  THN CM Short Term Goal #3 Start Date  11/30/17    [facility] CM Care Plan Problem Three     Most Recent Value  Care Plan Problem Three  New problems today: nausea, abd pain and muscle cramps in legs and hands.  Role Documenting the Problem Three  Care Management Coordinator  Care Plan for Problem Three  (S) Not Active  THN CM Short Term Goal #1   Pt is to choose low fat, not fried foods over the next 30 days.  THN CM Short Term Goal #1 Start Date  12/23/17  THN CM Short Term Goal #1 Met Date  12/28/17  THN CM Short Term Goal #2   Pt to schedule a visit with her primary care MD today for first available.  THN CM Short Term Goal #2 Start Date  12/23/17  [facility] CM Short Term Goal #2 Met Date  12/28/17      Assessment:   Abdominal pain and nausea of unknown origin.                         DM                         CHF                         COPD                         Hyperthyroidism  Plan: I will call or see pt weekly over the next 3 weeks. Pt to call me if any needs, or call MD.  Eulah Pont. Myrtie Neither, MSN, [facility] Gerontological Nurse Practitioner [facility] Care Management [phone]

## 2018-01-12 NOTE — Patient Outreach (Signed)
Patient is home returned home yesterday. Still not feeling well. I asked if I could come visit her this afternoon and she has agree. I will see her after 3:00 pm.  Kayleen Memos C. Myrtie Neither, MSN, [facility] Gerontological Nurse Practitioner [facility] Care Management [phone]

## 2018-01-13 NOTE — Progress Notes (Signed)
Pt completed HCPOA and LIving Will with signature but has not given copy to MD.

## 2018-01-18 ENCOUNTER — Ambulatory Visit: Payer: Medicare HMO | Admitting: *Deleted

## 2018-01-18 ENCOUNTER — Other Ambulatory Visit: Payer: Self-pay | Admitting: *Deleted

## 2018-01-18 NOTE — Patient Outreach (Addendum)
Transtion of care call. Mrs Wonnacott reports she is sleepy today. She also admits to having an incident where she fell asleep in her recliner and slipped down to the floor in her sleep. She called the first responders who came out and and assisted her up. She was not hypoglycemic, NFBS was 111. She is taking all her po meds as prescribed. She is not SOB but has lower extremity edema as usual. She has seen her pain management MD and he has given her an extra 15 tabs on top of her usual allotment for extra coverage when she is in severe pain.  THN CM Care Plan Problem One     Most Recent Value  Care Plan Problem One  High risk for falls and other household accidents.  Role Documenting the Problem One  Care Management [place] for Problem One  Active  THN Long Term Goal   Pt will not sustain any injury due to falls or any other household accident over the next 60 days.  THN CM Short Term Goal #1   Pt will go to bed when sleepy to avoid slipping out of her easy chair.  THN CM Short Term Goal #1 Start Date  01/18/18  Interventions for Short Term Goal #1  Discussed solutions and we agreed that she needs to go to bed to sleep and not stay in her easy chair.    [facility] CM Care Plan Problem Two     Most Recent Value  Care Plan Problem Two  Hypoglycemia  Role Documenting the Problem Two  Care Management Coordinator  Care Plan for Problem Two  Active  THN CM Short Term Goal #1   Pt to check glucose levels before taking insulin and record # of times her glucose is < 70 to NP  THN CM Short Term Goal #1 Start Date  01/18/18  THN CM Short Term Goal #2   Pt will recognize signs of hypoglycemia and self treat with appropriate snack or meal and recheck gluocse in 30 minutes each time she has a low over the next 30 days.  THN CM Short Term Goal #2 Start Date  01/18/18  Interventions for Short Term Goal #2  Discussed current habits for hypoglycemic episodes and she does treat appropriately when she  recognizes the event. Reinforced checking glucose levels before insulin dosing.       I will call her again next week.  Eulah Pont. Myrtie Neither, MSN, [facility] Gerontological Nurse Practitioner [facility] Care Management [phone]

## 2018-01-25 ENCOUNTER — Other Ambulatory Visit: Payer: Self-pay | Admitting: *Deleted

## 2018-01-25 ENCOUNTER — Ambulatory Visit: Payer: Medicare HMO | Admitting: *Deleted

## 2018-01-25 NOTE — Patient Outreach (Deleted)
Transition of care call. Sherri reports she is doing OK. The PT and OT are coming out but they have been able to work with her out of bed since she has been non wt bearing. She is looking forward to start touch down. She is totally bed bound at this point.  I have reminded her I am available if she needs something or support.  I have advised her to let me know when she needs her next transportation arranged.  Eulah Pont. Myrtie Neither, MSN, [facility] Gerontological Nurse Practitioner [facility] Care Management [phone]

## 2018-01-25 NOTE — Patient Outreach (Signed)
Transition of care call to [facility]. She has had a home health nurse there today. He has a lot of shoulder pain. She has had to use more pain medication than usual as the steroid injection she had didn't do much.   FBS was 105 this am. Pt took some diet [place] and ate nabs. No sxs of hypoglycemia.  No falls.  No SOB, has been using her O2 prn,  The weather has been hotter and more humid. Wearing her O2 helps her feel less anxious at times like this.  Pt says she will call me with problems, IF she is able to call when she has a problem.  I will call her again in a week.  Stephanie Pont. Myrtie Neither, MSN, [facility] Gerontological Nurse Practitioner [facility] Care Management [phone]

## 2018-02-01 ENCOUNTER — Other Ambulatory Visit: Payer: Self-pay | Admitting: *Deleted

## 2018-02-01 ENCOUNTER — Ambulatory Visit: Payer: Medicare HMO | Admitting: *Deleted

## 2018-02-01 NOTE — Patient Outreach (Addendum)
Transition of care call. Stephanie Combs reports she thinks she picked up a virus from a friend who visited her on the way back from a doctors appt. She reported she has has HA's and nausea which is what her friend had told her she had. She has not been eating or drinking much as she says she in nauseated. She has held her insulin and her furosemide. She reports her glucose this am was 110.  I have encouraged her to drink as much fluid as possible, encouraging water. Also to try some bland food like dry toast, peanut butter cracker, apple sauce. She can also take her zofran for nausea if she feels like it.  I will call her again in one week.  Stephanie Combs. Stephanie Neither, MSN, [facility] Gerontological Nurse Practitioner [facility] Care Management [phone]

## 2018-02-08 ENCOUNTER — Other Ambulatory Visit: Payer: Self-pay | Admitting: *Deleted

## 2018-02-08 ENCOUNTER — Ambulatory Visit: Payer: Medicare HMO | Admitting: *Deleted

## 2018-02-08 NOTE — Patient Outreach (Signed)
Transition of care call. Pt did not answer her phone but I was able to leave a message and request a return call.  Stephanie Combs. Myrtie Neither, MSN, [facility] Gerontological Nurse Practitioner [facility] Care Management [phone]

## 2018-02-10 ENCOUNTER — Ambulatory Visit: Payer: Medicare HMO | Admitting: Cardiovascular Disease

## 2018-02-15 ENCOUNTER — Ambulatory Visit: Payer: Medicare HMO | Admitting: *Deleted

## 2018-02-23 ENCOUNTER — Other Ambulatory Visit: Payer: Self-pay | Admitting: *Deleted

## 2018-02-23 NOTE — Patient Outreach (Addendum)
Transition of care call for HF, DM and COPD. Pt did not answer her phone and I left a message requesting her to return my call.  Eulah Pont. Myrtie Neither, MSN, GNP-BC Gerontological Nurse Practitioner [facility] Care Management [phone]  Mrs. Bergey returned my call last night after hours and then again this morning 02/24/18. She reports she saw her MD yesterday and he had a serious conversation with her about her health trajectory although she became emotional and could not go into detail. I listened and supported her. She reports she has had a cold and is staying in for the most part and in bed except for getting something to eat. She reports she has lost a lot of weight but is unsure how much. She admits when it gets cold she has to decide if she wants to eat or pay for heat. She has not been in the hospital the last few weeks. She is due to get her steroid injections in her back the first week of November. She reports her glucose levels have been in the 90's fasting and she says she feels much better when they are 120-130.  I have encouraged her to continue taking her meds, hydrating and eating a balanced diet. Call for medical advice when she has a problem.  I will make a home visit in 2 weeks in which I plan of closing her case. I want to introduce the idea of palliative care. There is a Paramedic palliative care and hospice that serves [facility] now.  Eulah Pont. Myrtie Neither, MSN, [facility] Gerontological Nurse Practitioner [facility] Care Management [phone]

## 2018-02-26 NOTE — Addendum Note (Signed)
Addended by: Deloria Lair on: 02/26/2018 10:22 AM   Modules accepted: Orders

## 2018-03-01 ENCOUNTER — Ambulatory Visit: Payer: Medicare HMO | Admitting: *Deleted

## 2018-03-03 ENCOUNTER — Other Ambulatory Visit: Payer: Self-pay

## 2018-03-03 NOTE — Patient Outreach (Signed)
[place] [facility]) Care Management  03/03/2018  [name] 17-Apr-1951 [id number]   Initial outreach to patient regarding social work referral for food and utility resources.  NP, Deloria Lair, requested that patient get 30 days of meal delivery from [employer].  BSW discussed this with patient and she agreed to referral being submitted.  [name] reported that she is aware of other resources for food but finding a way to pick it up is a challenge.  BSW inquired about food stamps.  [name] has been approved for food stamps but misplaced her card.  She said that she called earlier today to request a new one.  BSW agreed to send [name] a list of agencies that may be able to assist with paying her utility bill.  [name] said that her heating bill is high because she needs new under paneling on her mobile home as well as floor repair in the kitchen.  BSW informed her of agencies that may be able to assist with home modification.  BSW and [name] attempted to contact [employer] via three way call to submit referral.  Left voicemail message.  [name] also reported that she needs assistance in the home with housekeeping, bathing, etc.  Per [name], she was recently approved for Medicaid and she provided BSW with Medicaid number.  BSW informed her about [place] and the process for requesting these services. Referral was submitted to Independent Living for home modification. Referral was submitted to [facility] [place] [facility] [place] for [place] was faxed to Dr. [name] List of agencies that may be able to assist with utility bill was mailed to patient.  BSW received call back from [place].They are unable to assist with the type of home modification that [name] is requesting and suggested Independent Living.  BSW will follow up with patient once referrals to [place] and  [employer] are confirmed by agencies.    [name], BSW Social Worker [phone]

## 2018-03-08 ENCOUNTER — Other Ambulatory Visit: Payer: Self-pay

## 2018-03-08 NOTE — Patient Outreach (Signed)
[facility]) Care Management  03/08/2018  Stephanie Combs 03/31/1951 [id number]   BSW left voicemail message at Dr. Julianne Rice office regarding receipt of request for [place] that was faxed last week. BSW contacted [employer] regarding assistance with requested home modifications.  They are not able to assist with the under paneling of the home because [name] does not own the land that her home is on. They may, however, be able to assist with some other modifications to make the home more efficient.  An application has to be completed and submitted by [name]. BSW contacted [name] to provide update.  She gave permission for address to be given to [facility] for the purpose of mailing an application. [name] said that her daughter can assist with completion.  BSW called back to the council and provided her mailing address.  BSW informed [name] that she should be contacted by [name] from [place] because they may possibly be able to assist with repairs to kitchen floor that are needed.   Patient's daughter picked up 10 meals from [employer] programs and two other pick up dates have been arranged.  BSW will follow up with [name] again before the end of the week.    [name], BSW Social Worker [phone]

## 2018-03-09 ENCOUNTER — Other Ambulatory Visit: Payer: Self-pay | Admitting: *Deleted

## 2018-03-09 ENCOUNTER — Other Ambulatory Visit: Payer: Medicare HMO | Admitting: *Deleted

## 2018-03-09 NOTE — Patient Outreach (Addendum)
[facility]) Care Management  03/10/2018  Stephanie Combs March 10, 1951 [id number]  Home visit, possible case closure, depending on today's evaluation:   Subjective: Pt reports she has been sick for 3 days with nausea, vomiting and diarrhea. She has not taken any med or eaten much of anything in 3 days, only jello. She asked me to bring her a hamburger and fries and a tea when I come, which I did although that was not ideal. She states she feel like she's been hit by a truck. She hurts all over, especially her upper back, neck and head. She feels dizzy.  Objective: NFBS: 186, Pulse 66   Resp 18   SpO2 92%  BP 80/40 Alert and oriented. RRR Lungs are clear. MS: grimacing and shifting her wt frequently. Neck, shoulders, back and upper arms are tender to          Touch. Lower extremities - chronic edema 3+ with chronic skin changes  A:  Probable dehydration from vomiting and diarrhea, not eating or drinking adequately last 3 days.      Dizziness - hypotension due to dehydration      Chronic pain      COPD stable      DM moderate control, nomcompliance with diet and eating routine meals.      CHF stable  Completion of simple care plan goals. Pt is unwilling or unable to agree on most issues that could be improved. Please disregard that in care plan #1 that the short term goal has just been set and is not met, this refers to the prior goal only.     THN CM Care Plan Problem One     Most Recent Value  Care Plan Problem One  Pt wants minimal medical intervention and does not follow through with medical recommendations for disease management will transiition to palliative care.  Role Documenting the Problem One  Care Management [place] for Problem One  Active  THN Long Term Goal   Pt will transition to palliative care over the next 60 days.  THN Long Term Goal Start Date  03/10/18  THN Long Term Goal Met Date  03/10/18  Interventions for Problem One Long  Term Goal  Second discussion on palliatve care and need to transition. Educated pt on service. Will transition slowly from [place] [facility] to full Palliative care over the next 60 days per pt request and her needs for consistancy and mental wellbing.  THN CM Short Term Goal #1   Pt will cooperate with joing visit from [place] and palliative care initial visit by the end of 30 days.  THN CM Short Term Goal #1 Start Date  03/10/18  THN CM Short Term Goal #1 Met Date  02/24/18  Interventions for Short Term Goal #1  See interventions above for long term goal.. Made referral to [place]. Request joint visit for their initial visit.    THN CM Care Plan Problem Two     Most Recent Value  Care Plan Problem Two  Hypoglycemia  Role Documenting the Problem Two  Care Management Coordinator  Care Plan for Problem Two  Not Active  THN CM Short Term Goal #1   Pt to check glucose levels before taking insulin and record # of times her glucose is < 70 to NP  THN CM Short Term Goal #1 Start Date  01/18/18  [facility] CM Short Term Goal #1 Met Date   02/24/18  THN CM Short Term Goal #2   Pt will recognize signs of hypoglycemia and self treat with appropriate snack or meal and recheck gluocse in 30 minutes each time she has a low over the next 30 days.  THN CM Short Term Goal #2 Start Date  01/18/18  [facility] CM Short Term Goal #2 Met Date  02/24/18      P:  Advised she should go to ER for hydration and evaluation. Pt states she does not want to go. Encouraged pt to drink adequate fluids and eat 3 times a day. Provided a meal today with 12 oz of fluids and left pt with a bottle of water. We discussed transition to [place] for long term management since she does not want to go to the hospital and does not want aggressive care ( She has a MOST, HCPOA and Living will form and is a DNR). I explained that Palliative care would transition to Hospice care if her condition continued to decline.She wants to be  cared for at  home. I discussed my need to close her case and this time and she vehemently objected and became very emotional and stated that she really needs me to continue to be on her care team. I could tell that closing her case would be very detrimental to her at this time, so I made a compromise with her: I will make the referral to [facility] and request a joint visit to transition, then I will at least stay in contact by phone and see her again at agreed upon time in the future. She agreed to this plan. I will call Dr. Maudie Mercury to get his blessing to refer to Palliative care.   Called Dr. Maudie Mercury, he is on medical leave. I advised triage staff member I will be making referral to palliative care on this pt in his absence and that the service may be contacting the office for an associate to approve.  Amber Chrismon, [place], continues to be active with pt in arranging community services including meals, resources for financial assistance with heat and home repairs.   Eulah Pont. Myrtie Neither, MSN, [facility] Nurse Practitioner [facility] Care Management [phone]

## 2018-03-10 ENCOUNTER — Encounter: Payer: Self-pay | Admitting: *Deleted

## 2018-03-11 ENCOUNTER — Ambulatory Visit: Payer: Self-pay

## 2018-03-15 ENCOUNTER — Ambulatory Visit: Payer: Medicare HMO | Admitting: *Deleted

## 2018-03-16 ENCOUNTER — Other Ambulatory Visit: Payer: Self-pay

## 2018-03-16 ENCOUNTER — Ambulatory Visit: Payer: Self-pay

## 2018-03-16 NOTE — Patient Outreach (Addendum)
[place] [facility]) Care Management  03/16/2018  Stephanie Combs 1950-07-14 [id number]  BSW messaged [name] from Independent [facility] regarding status of referral for home modification. BSW is awaiting response.   Follow up call to Dr. [name] [name]'s office to get an update on request for [place] that was faxed on 03/09/18.  BSW was asked to fax request again because it could not be located.  BSW faxed request to the attention of Gerald Stabs.  Receipt of fax was confirmed and it was passed to NP for completion.   BSW contacted [name] to inform her that request is still being processed.  BSW inquired about whether or not she received application from [employer]. As of yet, she has not received this application.  BSW will follow up with [name] again once request for [place] has been sent to [facility] and response is received from [name] at [employer].    Addendum: BSW received communication from [name] at [employer]  that an in-home assessment has been scheduled for 03/30/18 @ 1:00.  [name], BSW Social Worker [phone]

## 2018-03-23 ENCOUNTER — Other Ambulatory Visit: Payer: Self-pay

## 2018-03-23 NOTE — Patient Outreach (Signed)
[place] [facility]) Care Management  03/23/2018  Stephanie Combs [birth year] [id number]   Outreach to provider regarding status of completion of request for [place].  Request has not yet been completed.  Nurse said she will ask for it to be completed tomorrow.  [name], BSW Social Worker [phone]

## 2018-03-26 ENCOUNTER — Other Ambulatory Visit: Payer: Self-pay

## 2018-03-26 NOTE — Patient Outreach (Signed)
[place] [facility]) Care Management  03/26/2018  SANII KUKLA 1950/06/23 [id number]   Voicemail message left for Roxan Hockey regarding request for personal care services that was faxed on 03/16/18.  [name], BSW Social Worker [phone]

## 2018-03-31 ENCOUNTER — Other Ambulatory Visit: Payer: Self-pay

## 2018-03-31 NOTE — Patient Outreach (Signed)
[place]) Care Management  03/31/2018  MIKENZI RAYSOR 09-29-1950 [id number]   Request for [place] faxed to [facility].  Will contact them on 04/02/18 to confirm receipt. Will also contact Ms. Mccollom to provide update.   [name], BSW Social Worker [phone]

## 2018-03-31 NOTE — Patient Outreach (Signed)
[facility]) Care Management  03/31/2018  Stephanie Combs January 13, 1951 [id number]   Follow up call to Dr. Julianne Rice office regarding request for personal care services that was faxed on 03/03/18 and 03/16/18.  No return call from voicemail left on 03/26/18.  Was asked to fax request again to the attention of Magda Paganini.  Request was faxed again and confirmation was received.   [name], BSW Social Worker [phone]

## 2018-04-02 ENCOUNTER — Other Ambulatory Visit: Payer: Self-pay

## 2018-04-02 ENCOUNTER — Ambulatory Visit: Payer: Self-pay

## 2018-04-02 NOTE — Patient Outreach (Signed)
[place] [facility]) Care Management  04/02/2018  Stephanie Combs [birth year] [id number]   Call to [facility] to ensure receipt of request for [place].  Request not yet received.  Customer Service Representative said it takes at least two business days to show in system and encouraged BSW to call back next week.  Will call on 04/05/18 to ensure receipt and fax again if necessary.  BSW will also call Ms. Trick to provide update.   [name], BSW Social Worker [phone]

## 2018-04-05 ENCOUNTER — Other Ambulatory Visit: Payer: Self-pay

## 2018-04-05 ENCOUNTER — Encounter (HOSPITAL_COMMUNITY): Payer: Self-pay

## 2018-04-05 ENCOUNTER — Emergency Department (HOSPITAL_COMMUNITY): Payer: Medicare HMO

## 2018-04-05 ENCOUNTER — Observation Stay (HOSPITAL_COMMUNITY)
Admission: EM | Admit: 2018-04-05 | Discharge: 2018-04-09 | Disposition: A | Discharge status: Home / Self Care | Payer: Medicare HMO | Attending: Family Medicine | Admitting: Family Medicine

## 2018-04-05 ENCOUNTER — Ambulatory Visit: Payer: Self-pay

## 2018-04-05 DIAGNOSIS — D631 Anemia in chronic kidney disease: Secondary | ICD-10-CM | POA: Diagnosis present

## 2018-04-05 DIAGNOSIS — I2510 Atherosclerotic heart disease of native coronary artery without angina pectoris: Secondary | ICD-10-CM | POA: Insufficient documentation

## 2018-04-05 DIAGNOSIS — Z7982 Long term (current) use of aspirin: Secondary | ICD-10-CM | POA: Diagnosis not present

## 2018-04-05 DIAGNOSIS — Z7902 Long term (current) use of antithrombotics/antiplatelets: Secondary | ICD-10-CM | POA: Diagnosis not present

## 2018-04-05 DIAGNOSIS — I130 Hypertensive heart and chronic kidney disease with heart failure and stage 1 through stage 4 chronic kidney disease, or unspecified chronic kidney disease: Secondary | ICD-10-CM | POA: Insufficient documentation

## 2018-04-05 DIAGNOSIS — R112 Nausea with vomiting, unspecified: Secondary | ICD-10-CM

## 2018-04-05 DIAGNOSIS — N390 Urinary tract infection, site not specified: Secondary | ICD-10-CM | POA: Diagnosis present

## 2018-04-05 DIAGNOSIS — D649 Anemia, unspecified: Secondary | ICD-10-CM | POA: Insufficient documentation

## 2018-04-05 DIAGNOSIS — R1084 Generalized abdominal pain: Secondary | ICD-10-CM | POA: Diagnosis present

## 2018-04-05 DIAGNOSIS — E1122 Type 2 diabetes mellitus with diabetic chronic kidney disease: Secondary | ICD-10-CM | POA: Diagnosis not present

## 2018-04-05 DIAGNOSIS — Z794 Long term (current) use of insulin: Secondary | ICD-10-CM | POA: Insufficient documentation

## 2018-04-05 DIAGNOSIS — E119 Type 2 diabetes mellitus without complications: Secondary | ICD-10-CM

## 2018-04-05 DIAGNOSIS — E039 Hypothyroidism, unspecified: Secondary | ICD-10-CM | POA: Diagnosis present

## 2018-04-05 DIAGNOSIS — N189 Chronic kidney disease, unspecified: Secondary | ICD-10-CM

## 2018-04-05 DIAGNOSIS — I5032 Chronic diastolic (congestive) heart failure: Secondary | ICD-10-CM | POA: Diagnosis present

## 2018-04-05 DIAGNOSIS — F419 Anxiety disorder, unspecified: Secondary | ICD-10-CM | POA: Diagnosis present

## 2018-04-05 DIAGNOSIS — J449 Chronic obstructive pulmonary disease, unspecified: Secondary | ICD-10-CM | POA: Diagnosis not present

## 2018-04-05 DIAGNOSIS — N179 Acute kidney failure, unspecified: Principal | ICD-10-CM | POA: Diagnosis present

## 2018-04-05 DIAGNOSIS — N183 Chronic kidney disease, stage 3 (moderate): Secondary | ICD-10-CM | POA: Diagnosis not present

## 2018-04-05 DIAGNOSIS — I10 Essential (primary) hypertension: Secondary | ICD-10-CM | POA: Diagnosis present

## 2018-04-05 DIAGNOSIS — R079 Chest pain, unspecified: Secondary | ICD-10-CM

## 2018-04-05 HISTORY — DX: Other chronic pain: G89.29

## 2018-04-05 LAB — COMPREHENSIVE METABOLIC PANEL
ALT: 11 U/L (ref 0–44)
AST: 16 U/L (ref 15–41)
Albumin: 3.9 g/dL (ref 3.5–5.0)
Alkaline Phosphatase: 84 U/L (ref 38–126)
Anion gap: 7 (ref 5–15)
BUN: 22 mg/dL (ref 8–23)
CO2: 26 mmol/L (ref 22–32)
Calcium: 9.1 mg/dL (ref 8.9–10.3)
Chloride: 105 mmol/L (ref 98–111)
Creatinine, Ser: 1.8 mg/dL — ABNORMAL HIGH (ref 0.44–1.00)
GFR calc Af Amer: 32 mL/min — ABNORMAL LOW (ref >60)
GFR calc non Af Amer: 28 mL/min — ABNORMAL LOW (ref >60)
Glucose, Bld: 130 mg/dL — ABNORMAL HIGH (ref 70–99)
Potassium: 4.5 mmol/L (ref 3.5–5.1)
Sodium: 138 mmol/L (ref 135–145)
Total Bilirubin: 0.9 mg/dL (ref 0.3–1.2)
Total Protein: 7.1 g/dL (ref 6.5–8.1)

## 2018-04-05 LAB — URINALYSIS, ROUTINE W REFLEX MICROSCOPIC
Glucose, UA: NEGATIVE mg/dL
Ketones, ur: 15 mg/dL — AB
Nitrite: NEGATIVE
Protein, ur: 100 mg/dL — AB
Specific Gravity, Urine: >1.03 — ABNORMAL HIGH (ref 1.005–1.030)
pH: 5.5 (ref 5.0–8.0)

## 2018-04-05 LAB — DIFFERENTIAL
Basophils Absolute: 0.1 10*3/uL (ref 0.0–0.1)
Basophils Relative: 1 %
Eosinophils Absolute: 0.1 10*3/uL (ref 0.0–0.5)
Eosinophils Relative: 2 %
Lymphocytes Relative: 19 %
Lymphs Abs: 1.5 10*3/uL (ref 0.7–4.0)
Monocytes Absolute: 0.7 10*3/uL (ref 0.1–1.0)
Monocytes Relative: 8 %
Neutro Abs: 5.4 10*3/uL (ref 1.7–7.7)
Neutrophils Relative %: 69 %

## 2018-04-05 LAB — URINALYSIS, MICROSCOPIC (REFLEX)

## 2018-04-05 LAB — CBC
HCT: 37.7 % (ref 36.0–46.0)
Hemoglobin: 11 g/dL — ABNORMAL LOW (ref 12.0–15.0)
MCH: 25.5 pg — ABNORMAL LOW (ref 26.0–34.0)
MCHC: 29.2 g/dL — ABNORMAL LOW (ref 30.0–36.0)
MCV: 87.3 fL (ref 80.0–100.0)
Platelets: 298 10*3/uL (ref 150–400)
RBC: 4.32 MIL/uL (ref 3.87–5.11)
RDW: 15.8 % — ABNORMAL HIGH (ref 11.5–15.5)
WBC: 7.5 10*3/uL (ref 4.0–10.5)
nRBC: 0 % (ref 0.0–0.2)

## 2018-04-05 LAB — TROPONIN I
Troponin I: <0.03 ng/mL (ref <0.03)
Troponin I: <0.03 ng/mL (ref <0.03)

## 2018-04-05 LAB — LIPASE, BLOOD: Lipase: 21 U/L (ref 11–51)

## 2018-04-05 MED ORDER — LACTATED RINGERS IV SOLN: INTRAVENOUS | Status: DC

## 2018-04-05 MED ORDER — SODIUM CHLORIDE 0.9 % IV SOLN
2.0000 g | Freq: Once | INTRAVENOUS | Status: AC
  Administered 2018-04-06: 2 g via INTRAVENOUS
  Filled 2018-04-05: qty 20

## 2018-04-05 MED ORDER — HEPARIN SODIUM (PORCINE) 5000 UNIT/ML IJ SOLN
5000.0000 [IU] | Freq: Three times a day (TID) | INTRAMUSCULAR | Status: DC
  Administered 2018-04-06 – 2018-04-09 (×12): 5000 [IU] via SUBCUTANEOUS
  Filled 2018-04-05 (×12): qty 1

## 2018-04-05 MED ORDER — ALUM & MAG HYDROXIDE-SIMETH 200-200-20 MG/5ML PO SUSP
30.0000 mL | Freq: Once | ORAL | Status: AC
  Administered 2018-04-05: 30 mL via ORAL
  Filled 2018-04-05: qty 30

## 2018-04-05 MED ORDER — ONDANSETRON HCL 4 MG PO TABS
4.0000 mg | ORAL_TABLET | Freq: Four times a day (QID) | ORAL | Status: DC | PRN
  Administered 2018-04-06: 4 mg via ORAL
  Filled 2018-04-05: qty 1

## 2018-04-05 MED ORDER — IOPAMIDOL (ISOVUE-300) INJECTION 61%
50.0000 mL | Freq: Once | INTRAVENOUS | Status: AC | PRN
  Administered 2018-04-05: 30 mL via ORAL

## 2018-04-05 MED ORDER — ONDANSETRON HCL 4 MG/2ML IJ SOLN
4.0000 mg | Freq: Four times a day (QID) | INTRAMUSCULAR | Status: DC | PRN
  Administered 2018-04-06 – 2018-04-09 (×3): 4 mg via INTRAVENOUS
  Filled 2018-04-05 (×3): qty 2

## 2018-04-05 MED ORDER — IOPAMIDOL (ISOVUE-300) INJECTION 61%: 50.0000 mL | Freq: Once | INTRAVENOUS | Status: DC | PRN

## 2018-04-05 MED ORDER — ONDANSETRON HCL 4 MG/2ML IJ SOLN
4.0000 mg | INTRAMUSCULAR | Status: DC | PRN
  Administered 2018-04-05: 4 mg via INTRAVENOUS
  Filled 2018-04-05: qty 2

## 2018-04-05 MED ORDER — SODIUM CHLORIDE 0.9 % IV SOLN
1.0000 g | Freq: Once | INTRAVENOUS | Status: DC
  Filled 2018-04-05: qty 10

## 2018-04-05 MED ORDER — FAMOTIDINE IN NACL 20-0.9 MG/50ML-% IV SOLN
20.0000 mg | Freq: Once | INTRAVENOUS | Status: AC
  Administered 2018-04-05: 20 mg via INTRAVENOUS
  Filled 2018-04-05: qty 50

## 2018-04-05 MED ORDER — FENTANYL CITRATE (PF) 100 MCG/2ML IJ SOLN
50.0000 ug | INTRAMUSCULAR | Status: DC | PRN
  Administered 2018-04-05: 50 ug via INTRAVENOUS
  Filled 2018-04-05: qty 2

## 2018-04-05 MED ORDER — SODIUM CHLORIDE 0.9 % IV SOLN
INTRAVENOUS | Status: DC
  Administered 2018-04-05 – 2018-04-06 (×3): via INTRAVENOUS

## 2018-04-05 MED ORDER — MORPHINE SULFATE (PF) 2 MG/ML IV SOLN
2.0000 mg | INTRAVENOUS | Status: AC | PRN
  Administered 2018-04-05 – 2018-04-06 (×2): 2 mg via INTRAVENOUS
  Filled 2018-04-05 (×2): qty 1

## 2018-04-05 NOTE — ED Notes (Signed)
ED TO INPATIENT HANDOFF REPORT  Name/Age/Gender Stephanie Combs 67 y.o. female  Code Status    Code Status Orders  (From admission, onward)         Start     Ordered   04/05/18 2223  Full code  Continuous     04/05/18 2225        Code Status History    Date Active Date Inactive Code Status Order ID Comments User Context   01/06/2018 2055 01/11/2018 1740 Full Code [id number]  Reubin Milan, MD ED   11/24/2017 1835 11/26/2017 2018 Full Code [id number]  Barton Dubois, MD Inpatient   10/24/2017 0240 10/25/2017 1926 Full Code [id number]  Vianne Bulls, MD ED   10/14/2017 0039 10/16/2017 1924 DNR [id number]  Oswald Hillock, MD Inpatient   09/02/2017 2321 09/04/2017 2230 Full Code [id number]  Heath Lark D, DO ED   05/15/2017 1821 05/26/2017 1955 DNR [id number]  [name], MD Inpatient   05/15/2017 1821 05/15/2017 1821 Full Code [id number]  [name], MD Inpatient   01/30/2017 1750 02/05/2017 1715 Full Code [id number]  [name], MD Inpatient   10/10/2016 0555 10/10/2016 2121 DNR [id number]  Oswald Hillock, MD Inpatient   12/06/2015 0200 12/06/2015 2147 DNR [id number]  Oswald Hillock, MD Inpatient   07/25/2015 1031 07/27/2015 1956 Full Code [id number]  Orvan Falconer, MD Inpatient   07/25/2015 0827 07/25/2015 1031 DNR [id number]  Orvan Falconer, MD ED   04/30/2015 0636 05/02/2015 2009 DNR [id number]  Oswald Hillock, MD Inpatient   03/24/2015 1843 03/25/2015 1804 DNR [id number]  Truett Mainland, DO Inpatient   12/04/2014 0939 12/06/2014 2115 Full Code [id number]  Samuella Cota, MD Inpatient   11/24/2014 1558 11/28/2014 1944 Full Code [id number]  Radene Gunning, NP Inpatient   11/07/2014 0654 11/10/2014 2231 Full Code [id number]  Orvan Falconer, MD Inpatient   10/02/2013 1939 10/03/2013 0125 Full Code [id number]  Janice Norrie, MD ED   05/26/2013 0620 05/30/2013 1758 DNR [id number]  Oswald Hillock, MD Inpatient   04/14/2013 2318 04/21/2013 1737 Full Code [id number]  Gardiner Barefoot, NP Inpatient   04/14/2013 1726 04/14/2013 2318 Full Code  [id number]  Radene Gunning, NP Inpatient   08/07/2012 2304 08/09/2012 2107 Full Code [id number]  Bonnielee Haff, MD Inpatient    Advance Directive Documentation     Most Recent Value  Type of Advance Directive  Living will, Out of facility DNR (pink MOST or yellow form)  Pre-existing out of facility DNR order (yellow form or pink MOST form)  -  "MOST" Form in Place?  -      Home/SNF/Other Home  Chief Complaint .  Level of Care/Admitting Diagnosis ED Disposition    ED Disposition Condition [facility] Area: [facility] [[id number]]  Level of Care: Telemetry [5]  Diagnosis: AKI (acute kidney injury) [facility]) [867619]  Admitting Physician: Reubin Milan [[id number]]  Attending Physician: Reubin Milan [phone]  PT Class (Do Not Modify): Observation [104]  PT Acc Code (Do Not Modify): Observation [10022]       Medical History Past Medical History:  Diagnosis Date  . Anxiety   . CAD (coronary artery disease)    a. s/p DES to LAD and angioplasty to D1 in 05/2017  . CHF (congestive heart failure) (HCC)    Diastolic  . Chronic back pain   . Chronic pain   . COPD (chronic obstructive pulmonary disease) ([place])   . Degenerative disk disease   .  Diabetes mellitus without complication ([place])   . History of medication noncompliance 11/2017   "the patient frequently self-adjusts medications without notifying her physicians"  . Hypertension   . Hypothyroidism   . On home O2    2.5 L N/C prn  . Pedal edema     Allergies Allergies  Allergen Reactions  . Dilaudid [Hydromorphone Hcl] Itching  . Midodrine Hcl Swelling    After one dose had anaphylactic reaction, had to call EMS.  Marland Kitchen Actifed Cold-Allergy [Chlorpheniramine-Phenylephrine]     "I was sick and red and it didn't agree with me at all"  . Doxycycline Nausea And Vomiting  . Other Cough    Pt states she is allergic to ragweed and that she starts coughing and sneezing like crazy  . Penicillins Hives  and Itching    Tolerates Rocephin Has patient had a PCN reaction causing immediate rash, facial/tongue/throat swelling, SOB or lightheadedness with hypotension: Yes Has patient had a PCN reaction causing severe rash involving mucus membranes or skin necrosis: No Has patient had a PCN reaction that required hospitalization Yes Has patient had a PCN reaction occurring within the last 10 years: No If all of the above answers are "NO", then may proceed with Cephalosporin use.   . Reglan [Metoclopramide] Itching  . Valium [Diazepam] Itching  . Vistaril [Hydroxyzine Hcl] Itching    IV Location/Drains/Wounds Patient Lines/Drains/Airways Status   Active Line/Drains/Airways    Name:   Placement date:   Placement time:   Site:   Days:   Peripheral IV 04/05/18 Left Antecubital   04/05/18    1602    Antecubital   less than 1          Labs/Imaging Results for orders placed or performed during the hospital encounter of 04/05/18 (from the past 48 hour(s))  Urinalysis, Routine w reflex microscopic     Status: Abnormal   Collection Time: 04/05/18  3:59 PM  Result Value Ref Range   Color, Urine YELLOW YELLOW   APPearance HAZY (A) CLEAR   Specific Gravity, Urine >1.030 (H) 1.005 - 1.030   pH 5.5 5.0 - 8.0   Glucose, UA NEGATIVE NEGATIVE mg/dL   Hgb urine dipstick SMALL (A) NEGATIVE   Bilirubin Urine SMALL (A) NEGATIVE   Ketones, ur 15 (A) NEGATIVE mg/dL   Protein, ur 100 (A) NEGATIVE mg/dL   Nitrite NEGATIVE NEGATIVE   Leukocytes, UA SMALL (A) NEGATIVE    Comment: Performed at [facility], [address] [place], [place] [address]  Urinalysis, Microscopic (reflex)     Status: Abnormal   Collection Time: 04/05/18  3:59 PM  Result Value Ref Range   RBC / HPF 6-10 0 - 5 RBC/hpf   WBC, UA 21-50 0 - 5 WBC/hpf   Bacteria, UA MANY (A) NONE SEEN   Squamous Epithelial / LPF 21-50 0 - 5   Hyaline Casts, UA PRESENT     Comment: Performed at [facility], [address] [place], [place] [address]   Lipase, blood     Status: None   Collection Time: 04/05/18  4:29 PM  Result Value Ref Range   Lipase 21 11 - 51 U/L    Comment: Performed at [facility], [address], [place], [place] [address]  Comprehensive metabolic panel     Status: Abnormal   Collection Time: 04/05/18  4:29 PM  Result Value Ref Range   Sodium 138 135 - 145 mmol/L   Potassium 4.5 3.5 - 5.1 mmol/L   Chloride  105 98 - 111 mmol/L   CO2 26 22 - 32 mmol/L   Glucose, Bld 130 (H) 70 - 99 mg/dL   BUN 22 8 - 23 mg/dL   Creatinine, Ser 1.80 (H) 0.44 - 1.00 mg/dL   Calcium 9.1 8.9 - 10.3 mg/dL   Total Protein 7.1 6.5 - 8.1 g/dL   Albumin 3.9 3.5 - 5.0 g/dL   AST 16 15 - 41 U/L   ALT 11 0 - 44 U/L   Alkaline Phosphatase 84 38 - 126 U/L   Total Bilirubin 0.9 0.3 - 1.2 mg/dL   GFR calc non Af Amer 28 (L) >60 mL/min   GFR calc Af Amer 32 (L) >60 mL/min    Comment: (NOTE) The eGFR has been calculated using the CKD EPI equation. This calculation has not been validated in all clinical situations. eGFR's persistently <60 mL/min signify possible Chronic Kidney Disease.    Anion gap 7 5 - 15    Comment: Performed at [facility], [address] [place], [place] [address]  CBC     Status: Abnormal   Collection Time: 04/05/18  4:29 PM  Result Value Ref Range   WBC 7.5 4.0 - 10.5 K/uL   RBC 4.32 3.87 - 5.11 MIL/uL   Hemoglobin 11.0 (L) 12.0 - 15.0 g/dL   HCT 37.7 36.0 - 46.0 %   MCV 87.3 80.0 - 100.0 fL   MCH 25.5 (L) 26.0 - 34.0 pg   MCHC 29.2 (L) 30.0 - 36.0 g/dL   RDW 15.8 (H) 11.5 - 15.5 %   Platelets 298 150 - 400 K/uL   nRBC 0.0 0.0 - 0.2 %    Comment: Performed at [facility], [address], [place], [place] [address]  Differential     Status: None   Collection Time: 04/05/18  4:29 PM  Result Value Ref Range   Neutrophils Relative % 69 %   Neutro Abs 5.4 1.7 - 7.7 K/uL   Lymphocytes Relative 19 %   Lymphs Abs 1.5 0.7 - 4.0 K/uL   Monocytes Relative 8 %   Monocytes Absolute 0.7 0.1 - 1.0 K/uL    Eosinophils Relative 2 %   Eosinophils Absolute 0.1 0.0 - 0.5 K/uL   Basophils Relative 1 %   Basophils Absolute 0.1 0.0 - 0.1 K/uL    Comment: Performed at [facility], [address] [place], [place] [address]  Troponin I - Once     Status: None   Collection Time: 04/05/18  5:28 PM  Result Value Ref Range   Troponin I <0.03 <0.03 ng/mL    Comment: Performed at [facility], [address] [place], [place] [address]  Troponin I - Once     Status: None   Collection Time: 04/05/18  8:26 PM  Result Value Ref Range   Troponin I <0.03 <0.03 ng/mL    Comment: Performed at [facility], [address] [place], [place] [address]   Ct Abdomen Pelvis Wo Contrast  Result Date: 04/05/2018 CLINICAL DATA:  Mid abdominal pain for 5 days.  Nausea and vomiting. EXAM: CT ABDOMEN AND PELVIS WITHOUT CONTRAST TECHNIQUE: Multidetector CT imaging of the abdomen and pelvis was performed following the standard protocol without IV contrast. COMPARISON:  CT of the abdomen and pelvis 04/30/2015 FINDINGS: Lower chest: Lung bases are clear without focal nodule, mass, or airspace disease. Mitral valve annular calcifications are present. Heart size is normal. No significant pleural or pericardial effusion is present. Hepatobiliary: No focal liver abnormality is  seen. No gallstones, gallbladder wall thickening, or biliary dilatation. Pancreas: Unremarkable. No pancreatic ductal dilatation or surrounding inflammatory changes. Spleen: Normal in size without focal abnormality. Adrenals/Urinary Tract: Left adrenal myelolipoma is stable. Right adrenal gland is within normal limits. Two punctate nonobstructing stones are present in the right kidney. A single stone at the upper pole of the left kidney measures 4 mm. There is no obstruction of either collecting system. Ureters are within normal limits to the urinary bladder which is mostly collapsed. No mass lesion is present. Stomach/Bowel: A duodenal diverticulum is present. Duodenum  is otherwise within normal limits. No inflammatory changes evident. Small bowel is within normal limits. Terminal ileum is normal. The appendix is not discretely visualized and may be surgically absent. The ascending and transverse colon are within normal limits. Descending colon is normal. Diverticular changes are present the sigmoid colon without inflammation. Vascular/Lymphatic: Atherosclerotic calcifications are present without aneurysm. No significant adenopathy is present. Reproductive: Uterus and bilateral adnexa are unremarkable. Other: Subcutaneous injection granulomata are again noted. No significant ventral hernia is present. There is no significant free fluid. Musculoskeletal: Advanced degenerative changes are again noted at L4-5. Vertebral body heights are maintained. Advanced facet degenerative changes contribute to significant foraminal stenosis, also greatest at L4-5. Pelvis is within normal limits. Hips are located and normal bilaterally. IMPRESSION: 1. No acute or focal abnormality to explain the patient's pain. 2. Stable chronic findings. 3.  Aortic Atherosclerosis (ICD10-I70.0). 4. Left adrenal myelolipoma. 5. Nonobstructing nephrolithiasis. No evidence for recent obstruction. 6. Multilevel degenerative changes of the lumbar spine. Electronically Signed   By: San Morelle M.D.   On: 04/05/2018 21:35   Dg Chest 2 View  Result Date: 04/05/2018 CLINICAL DATA:  Mid to left chest pain EXAM: CHEST - 2 VIEW COMPARISON:  01/06/2018 FINDINGS: The patient is rotated to the right on today's radiograph, reducing diagnostic sensitivity and specificity. Lower thoracic spondylosis. There could be some volume loss in the right lung causing the rightward shift of cardiac and mediastinal structures, although some of the appearance is rotational. Suspected scarring at the right lung base. Mild cardiomegaly, without edema. IMPRESSION: 1. Stable mild cardiomegaly, without edema. 2. Mild scarring at the  right lung base. 3. Possible volume loss in the right hemithorax, versus rightward rotation as a cause for the rightward deviation of cardiac mediastinal structures. Electronically Signed   By: Van Clines M.D.   On: 04/05/2018 20:08    Pending Labs Unresulted Labs (From admission, onward)    Start     Ordered   04/06/18 0500  CBC WITH DIFFERENTIAL  Tomorrow morning,   R     04/05/18 2225   04/06/18 9030  Basic metabolic panel  Tomorrow morning,   R     04/05/18 2225   04/05/18 2220  Magnesium  Add-on,   R     04/05/18 2220   04/05/18 1729  Urine culture  ONCE - STAT,   STAT     04/05/18 1728          Vitals/Pain Today's Vitals   04/05/18 2000 04/05/18 2030 04/05/18 2100 04/05/18 2130  BP: 131/61 130/66 118/80 (!) 144/74  Pulse:  (!) 56 64 64  Resp: '18 15 19 18  ' SpO2:  97% 98% 96%  Weight:      Height:      PainSc:        Isolation Precautions No active isolations  Medications Medications  ondansetron (ZOFRAN) injection 4 mg (4 mg Intravenous Given 04/05/18 1736)  morphine 2 MG/ML injection 2 mg (2 mg Intravenous Given 04/05/18 1736)  0.9 %  sodium chloride infusion ( Intravenous New Bag/Given 04/05/18 1745)  iopamidol (ISOVUE-300) 61 % injection 50 mL (has no administration in time range)  fentaNYL (SUBLIMAZE) injection 50 mcg (50 mcg Intravenous Given 04/05/18 2230)  cefTRIAXone (ROCEPHIN) 2 g in sodium chloride 0.9 % 100 mL IVPB (has no administration in time range)  heparin injection 5,000 Units (has no administration in time range)  lactated ringers infusion (has no administration in time range)  ondansetron (ZOFRAN) tablet 4 mg (has no administration in time range)    Or  ondansetron (ZOFRAN) injection 4 mg (has no administration in time range)  famotidine (PEPCID) IVPB 20 mg premix (0 mg Intravenous Stopped 04/05/18 1811)  iopamidol (ISOVUE-300) 61 % injection 50 mL (30 mLs Oral Contrast Given 04/05/18 1813)  alum & mag hydroxide-simeth (MAALOX/MYLANTA)  200-200-20 MG/5ML suspension 30 mL (30 mLs Oral Given 04/05/18 2230)    Mobility walks

## 2018-04-05 NOTE — H&P (Signed)
History and Physical    Stephanie Combs VZD:[id number] DOB: 1950-06-14 DOA: 04/05/2018  PCP: [name], MD   Patient coming from: Home.  I have personally briefly reviewed patient's old medical records in [place]  Chief Complaint: Nausea and vomiting.  HPI: Stephanie Combs is a 67 y.o. female with medical history significant of anxiety, history of CAD (s/p DES to LAD and angioplasty to D1 in 05/2017), chronic diastolic CHF, chronic back pain, history of degenerative disc disease, COPD, type 2 diabetes, hypertension, hypothyroidism, chronic pedal edema, morbid obesity, chronic anemia, recurrent syncopal episodes with several admissions this year due to this who is coming to the emergency department due to abdominal pain, nausea and emesis for the past 5 days. She denies fever, but feels chills. She denies  diarrhea, but complains of severe constipation. Her appetite is has been poor.  She also complains of mild dysuria and left flank pain.  She denies gross hematuria, but states that her urinary output has been decreased. Denies productive cough, wheezing or hemoptysis.    She denies chest pain, palpitations, dizziness, diaphoresis, PND, orthopnea, but complains of occasional lower extremity edema.  No polyuria, polydipsia, polyphagia or blurred vision.  ED Course: Initial vital signs temperature 98.4 F, pulse 72, respirations 24, blood pressure 128/64 mmHg and O2 sat 97%.  He received Maalox, 2 g of ceftriaxone, famotidine 20 mg IVPB and morphine 2 mg IVPB x2 doses in the emergency department.  Her urinalysis is hazy color, concentrated, with positive bilirubin, ketonuria, proteinuria, small leukocyte esterase, 20-50 WBCs with many bacteria.  Her white blood cell count was 7.5, hemoglobin 11.0 g/dL and platelets 298.  CMP shows a glucose of 130 and a creatinine of 1.8 mg/dL.   Imaging: Her chest radiograph shows stable mild cardiomegaly, without edema.  There is mild scarring at the  right lung base with possible volume loss in the right hemithorax versus rightward rotation.  CT abdomen/pelvis no acute intra-abdominal or intrapelvic pathology.  There are some nonobstructing nephrolithiasis among other chronic findings.  Please see images and full radiology report for further detail.  Review of Systems: As per HPI otherwise 10 point review of systems negative.   Past Medical History:  Diagnosis Date  . Anxiety   . CAD (coronary artery disease)    a. s/p DES to LAD and angioplasty to D1 in 05/2017  . CHF (congestive heart failure) (HCC)    Diastolic  . Chronic back pain   . Chronic pain   . COPD (chronic obstructive pulmonary disease) ([place])   . Degenerative disk disease   . Diabetes mellitus without complication ([place])   . History of medication noncompliance 11/2017   "the patient frequently self-adjusts medications without notifying her physicians"  . Hypertension   . Hypothyroidism   . On home O2    2.5 L N/C prn  . Pedal edema     Past Surgical History:  Procedure Laterality Date  . CORONARY STENT INTERVENTION N/A 05/22/2017   Procedure: CORONARY STENT INTERVENTION;  Surgeon: Martinique, Peter M, MD;  Location: [facility];  Service: Cardiovascular;  Laterality: N/A;  . HERNIA REPAIR    . RIGHT/LEFT HEART CATH AND CORONARY ANGIOGRAPHY N/A 05/19/2017   Procedure: RIGHT/LEFT HEART CATH AND CORONARY ANGIOGRAPHY;  Surgeon: Leonie Man, MD;  Location: [facility];  Service: Cardiovascular;  Laterality: N/A;  . TUBAL LIGATION       reports that she quit smoking about 3 years ago. Her  smoking use included cigarettes. She smoked 0.25 packs per day. She has never used smokeless tobacco. She reports that she does not drink alcohol or use drugs.  Allergies  Allergen Reactions  . Dilaudid [Hydromorphone Hcl] Itching  . Midodrine Hcl Swelling    After one dose had anaphylactic reaction, had to call EMS.  Marland Kitchen Actifed Cold-Allergy  [Chlorpheniramine-Phenylephrine]     "I was sick and red and it didn't agree with me at all"  . Doxycycline Nausea And Vomiting  . Other Cough    Pt states she is allergic to ragweed and that she starts coughing and sneezing like crazy  . Penicillins Hives and Itching    Tolerates Rocephin Has patient had a PCN reaction causing immediate rash, facial/tongue/throat swelling, SOB or lightheadedness with hypotension: Yes Has patient had a PCN reaction causing severe rash involving mucus membranes or skin necrosis: No Has patient had a PCN reaction that required hospitalization Yes Has patient had a PCN reaction occurring within the last 10 years: No If all of the above answers are "NO", then may proceed with Cephalosporin use.   . Reglan [Metoclopramide] Itching  . Valium [Diazepam] Itching  . Vistaril [Hydroxyzine Hcl] Itching    Family History  Problem Relation Age of Onset  . Stroke Mother   . Heart attack Father   . Diabetes Brother   . Cerebral palsy Brother   . Pneumonia Brother   . Diabetes Other   . Heart attack Other    Prior to Admission medications   Medication Sig Start Date End Date Taking? Authorizing Provider  albuterol (PROAIR HFA) 108 (90 BASE) MCG/ACT inhaler Inhale 1 puff into the lungs every 4 (four) hours as needed. For shortness of breath Patient taking differently: Inhale 2 puffs into the lungs every 4 (four) hours as needed. For shortness of breath 05/30/13  Yes Buriev, Arie Sabina, MD  albuterol (PROVENTIL) (2.5 MG/3ML) 0.083% nebulizer solution Take 2.5 mg by nebulization 2 (two) times daily as needed for wheezing or shortness of breath.    Yes [provider]  ALPRAZolam Duanne Moron) 1 MG tablet Take 1 mg by mouth 3 (three) times daily as needed for anxiety or sleep.  01/05/17  Yes [provider]  aspirin EC 81 MG tablet Take 81 mg by mouth daily.   Yes [provider]  atorvastatin (LIPITOR) 80 MG tablet Take 1 tablet (80 mg total) by  mouth daily at 6 PM. Patient taking differently: Take 80 mg by mouth every morning.  05/26/17  Yes [name], MD  Cholecalciferol (VITAMIN D3) 5000 units CAPS Take 1 capsule (5,000 Units total) by mouth daily. 08/19/16  Yes Cassandria Anger, MD  clopidogrel (PLAVIX) 75 MG tablet Take 1 tablet (75 mg total) by mouth daily. 05/26/17  Yes [name], MD  docusate sodium (COLACE) 100 MG capsule Take 300 mg by mouth daily.   Yes [provider]  ferrous sulfate 325 (65 FE) MG tablet Take 1 tablet (325 mg total) by mouth daily with breakfast. 02/05/17  Yes [name], MD  furosemide (LASIX) 20 MG tablet Take 1 tablet (20 mg total) by mouth daily. Patient taking differently: Take 20 mg by mouth every 3 (three) days.  12/22/17  Yes Strader, [name] M, PA-C  gabapentin (NEURONTIN) 600 MG tablet Take 600 mg by mouth 4 (four) times daily.   Yes [provider]  Hydrocortisone (GERHARDT'S BUTT CREAM) CREA Apply 1 application topically 3 (three) times daily. 05/26/17  Yes [name],  MD  insulin aspart (NOVOLOG) 100 UNIT/ML injection Inject 0-9 Units into the skin 3 (three) times daily with meals. Patient taking differently: Inject 1-18 Units into the skin 3 (three) times daily with meals. Per sliding scale 05/26/17  Yes [name], MD  insulin detemir (LEVEMIR) 100 UNIT/ML injection Inject 0.4 mLs (40 Units total) into the skin at bedtime. 11/26/17  Yes Tat, Shanon Brow, MD  levothyroxine (SYNTHROID, LEVOTHROID) 175 MCG tablet Take 175 mcg by mouth daily.  12/15/17  Yes [provider]  nitroGLYCERIN (NITROSTAT) 0.4 MG SL tablet Place 0.4 mg under the tongue every 5 (five) minutes as needed for chest pain.  03/26/18  Yes [provider]  nystatin (MYCOSTATIN/NYSTOP) powder Apply topically 2 (two) times daily. 10/10/16  Yes Eber Jones, MD  Omega-3 Fatty Acids (FISH OIL PO) Take 1 capsule by mouth daily.    Yes [provider]  ondansetron (ZOFRAN-ODT) 8 MG  disintegrating tablet Take 8 mg by mouth every 8 (eight) hours as needed for nausea or vomiting.  03/15/18  Yes [provider]  oxyCODONE-acetaminophen (PERCOCET) 10-325 MG tablet Take 1 tablet by mouth every 6 (six) hours as needed for pain.   Yes [provider]  OXYGEN Inhale 2 L into the lungs continuous.   Yes [provider]  pantoprazole (PROTONIX) 40 MG tablet Take 1 tablet (40 mg total) by mouth daily. Patient taking differently: Take 40 mg by mouth 2 (two) times daily.  02/05/17  Yes [name], MD  potassium chloride (MICRO-K) 10 MEQ CR capsule Take 20 mEq by mouth 2 (two) times daily. *May take one additional tablet as needed for cramping   Yes [provider]  tiZANidine (ZANAFLEX) 4 MG tablet Take 4-8 mg by mouth See admin instructions. 4mg  three times daily and 8mg  at bedtime 01/10/17  Yes [provider]  topiramate (TOPAMAX) 50 MG tablet Take 150 mg by mouth every evening.  03/18/18  Yes [provider]  umeclidinium-vilanterol (ANORO ELLIPTA) 62.5-25 MCG/INH AEPB Inhale 1 puff into the lungs daily. Patient taking differently: Inhale 1 puff into the lungs daily as needed (for shortness of breath).  05/26/17  Yes [name], MD  VICTOZA 18 MG/3ML SOPN Inject 1.2 mg into the skin daily.  01/01/17  Yes [provider]  SURE COMFORT INS SYR 1CC/28G 28G X 1/2" 1 ML MISC USE TO INJECT INSULIN UP TO 4 TIMES DAILY. 03/06/17   Cassandria Anger, MD    Physical Exam: Vitals:   04/05/18 2000 04/05/18 2030 04/05/18 2100 04/05/18 2130  BP: 131/61 130/66 118/80 (!) 144/74  Pulse:  (!) 56 64 64  Resp: 18 15 19 18   SpO2:  97% 98% 96%  Weight:      Height:        Constitutional: NAD, calm, comfortable Eyes: PERRL, lids and conjunctivae normal ENMT: Mucous membranes are dry.  Posterior pharynx clear of any exudate or lesions. Neck: Normal, supple, no masses, no thyromegaly Respiratory: Decreased breath sounds on bases, otherwise  clear to auscultation bilaterally, no wheezing, no crackles. Normal respiratory effort. No accessory muscle use.  Cardiovascular: Regular rate and rhythm, no murmurs / rubs / gallops. No extremity edema. 2+ pedal pulses. No carotid bruits.  Abdomen: Obese soft, right CVA tenderness, no guarding or rebound, no masses palpated. No hepatosplenomegaly. Bowel sounds positive.  Musculoskeletal: no clubbing / cyanosis.  Good ROM, no contractures. Normal muscle tone.  Skin: Positive erythema, calor, mild edema and tenderness on right lower extremity  pretibial, medial and posterior aspect.  Positive erythema on left pretibial area. Elephantiasis-like skin changes on dorsal aspect of feet.  Neurologic: CN 2-12 grossly intact. Sensation intact, DTR normal. Strength 5/5 in all 4.  Psychiatric: Normal judgment and insight. Alert and oriented x 4.  Mildly anxious mood.      Labs on Admission: I have personally reviewed following labs and imaging studies  CBC: Recent Labs  Lab 04/05/18 1629  WBC 7.5  NEUTROABS 5.4  HGB 11.0*  HCT 37.7  MCV 87.3  PLT 532   Basic Metabolic Panel: Recent Labs  Lab 04/05/18 1629  NA 138  K 4.5  CL 105  CO2 26  GLUCOSE 130*  BUN 22  CREATININE 1.80*  CALCIUM 9.1   GFR: Estimated Creatinine Clearance: 41.6 mL/min (A) (by C-G formula based on SCr of 1.8 mg/dL (H)). Liver Function Tests: Recent Labs  Lab 04/05/18 1629  AST 16  ALT 11  ALKPHOS 84  BILITOT 0.9  PROT 7.1  ALBUMIN 3.9   Recent Labs  Lab 04/05/18 1629  LIPASE 21   No results for input(s): AMMONIA in the last 168 hours. Coagulation Profile: No results for input(s): INR, PROTIME in the last 168 hours. Cardiac Enzymes: Recent Labs  Lab 04/05/18 1728 04/05/18 2026  TROPONINI <0.03 <0.03   BNP (last 3 results) No results for input(s): PROBNP in the last 8760 hours. HbA1C: No results for input(s): HGBA1C in the last 72 hours. CBG: No results for input(s): GLUCAP in the last 168  hours. Lipid Profile: No results for input(s): CHOL, HDL, LDLCALC, TRIG, CHOLHDL, LDLDIRECT in the last 72 hours. Thyroid Function Tests: No results for input(s): TSH, T4TOTAL, FREET4, T3FREE, THYROIDAB in the last 72 hours. Anemia Panel: No results for input(s): VITAMINB12, FOLATE, FERRITIN, TIBC, IRON, RETICCTPCT in the last 72 hours. Urine analysis:    Component Value Date/Time   COLORURINE YELLOW 04/05/2018 1559   APPEARANCEUR HAZY (A) 04/05/2018 1559   LABSPEC >1.030 (H) 04/05/2018 1559   PHURINE 5.5 04/05/2018 1559   GLUCOSEU NEGATIVE 04/05/2018 1559   HGBUR SMALL (A) 04/05/2018 1559   BILIRUBINUR SMALL (A) 04/05/2018 1559   KETONESUR 15 (A) 04/05/2018 1559   PROTEINUR 100 (A) 04/05/2018 1559   UROBILINOGEN 0.2 12/04/2014 0650   NITRITE NEGATIVE 04/05/2018 1559   LEUKOCYTESUR SMALL (A) 04/05/2018 1559    Radiological Exams on Admission: Ct Abdomen Pelvis Wo Contrast  Result Date: 04/05/2018 CLINICAL DATA:  Mid abdominal pain for 5 days.  Nausea and vomiting. EXAM: CT ABDOMEN AND PELVIS WITHOUT CONTRAST TECHNIQUE: Multidetector CT imaging of the abdomen and pelvis was performed following the standard protocol without IV contrast. COMPARISON:  CT of the abdomen and pelvis 04/30/2015 FINDINGS: Lower chest: Lung bases are clear without focal nodule, mass, or airspace disease. Mitral valve annular calcifications are present. Heart size is normal. No significant pleural or pericardial effusion is present. Hepatobiliary: No focal liver abnormality is seen. No gallstones, gallbladder wall thickening, or biliary dilatation. Pancreas: Unremarkable. No pancreatic ductal dilatation or surrounding inflammatory changes. Spleen: Normal in size without focal abnormality. Adrenals/Urinary Tract: Left adrenal myelolipoma is stable. Right adrenal gland is within normal limits. Two punctate nonobstructing stones are present in the right kidney. A single stone at the upper pole of the left kidney  measures 4 mm. There is no obstruction of either collecting system. Ureters are within normal limits to the urinary bladder which is mostly collapsed. No mass lesion is present. Stomach/Bowel: A duodenal diverticulum is present. Duodenum  is otherwise within normal limits. No inflammatory changes evident. Small bowel is within normal limits. Terminal ileum is normal. The appendix is not discretely visualized and may be surgically absent. The ascending and transverse colon are within normal limits. Descending colon is normal. Diverticular changes are present the sigmoid colon without inflammation. Vascular/Lymphatic: Atherosclerotic calcifications are present without aneurysm. No significant adenopathy is present. Reproductive: Uterus and bilateral adnexa are unremarkable. Other: Subcutaneous injection granulomata are again noted. No significant ventral hernia is present. There is no significant free fluid. Musculoskeletal: Advanced degenerative changes are again noted at L4-5. Vertebral body heights are maintained. Advanced facet degenerative changes contribute to significant foraminal stenosis, also greatest at L4-5. Pelvis is within normal limits. Hips are located and normal bilaterally. IMPRESSION: 1. No acute or focal abnormality to explain the patient's pain. 2. Stable chronic findings. 3.  Aortic Atherosclerosis (ICD10-I70.0). 4. Left adrenal myelolipoma. 5. Nonobstructing nephrolithiasis. No evidence for recent obstruction. 6. Multilevel degenerative changes of the lumbar spine. Electronically Signed   By: San Morelle M.D.   On: 04/05/2018 21:35   Dg Chest 2 View  Result Date: 04/05/2018 CLINICAL DATA:  Mid to left chest pain EXAM: CHEST - 2 VIEW COMPARISON:  01/06/2018 FINDINGS: The patient is rotated to the right on today's radiograph, reducing diagnostic sensitivity and specificity. Lower thoracic spondylosis. There could be some volume loss in the right lung causing the rightward shift of  cardiac and mediastinal structures, although some of the appearance is rotational. Suspected scarring at the right lung base. Mild cardiomegaly, without edema. IMPRESSION: 1. Stable mild cardiomegaly, without edema. 2. Mild scarring at the right lung base. 3. Possible volume loss in the right hemithorax, versus rightward rotation as a cause for the rightward deviation of cardiac mediastinal structures. Electronically Signed   By: Van Clines M.D.   On: 04/05/2018 20:08    EKG: Independently reviewed.  Vent. rate 68 BPM PR interval * ms QRS duration 104 ms QT/QTc 401/427 ms P-R-T axes 66 70 83 Sinus rhythm Atrial premature complexes Baseline wander  Assessment/Plan Principal Problem:   AKI (acute kidney injury) ([place]) Observation/telemetry. Continue IV fluids. Hold torsemide. Monitor intake and output. Follow-up renal function and electrolytes.  Active Problems:   UTI (urine tract infection) Continue ceftriaxone 2 g IVPB every 24 hours. Follow-up urine culture and sensitivity.    COPD (chronic obstructive pulmonary disease) (HCC) No signs of decompensation. Continue supplemental oxygen. Bronchodilators as needed.    DM type 2 (diabetes mellitus, type 2) (HCC) Last hemoglobin A1c was 8.1% in June this year. Carbohydrate modified diet. Continue Levemir 40 units SQ daily. Continue Victoza 1.2 mg SQ daily. CBG monitoring with regular insulin sliding scale.    HTN (hypertension), benign Hold furosemide. Monitor blood pressure, renal function and electrolytes.    Chronic diastolic CHF (congestive heart failure) (HCC) No signs of decompensation at this time. Monitor intake and output. Hold furosemide due to AKI.    Hypothyroidism Continue levothyroxine 175 mcg p.o. daily. Last TSH was 8.354 on 01/09/2018    Anemia in CKD (chronic kidney disease) Anemia panel in September of last year showed decrease iron and low ferritin. Continue iron supplementation. Monitor  hematocrit and hemoglobin.    Anxiety Continue alprazolam 1 mg p.o. 3 times daily as needed.    DVT prophylaxis: Heparin SQ. Code Status: Full code. Family Communication: Disposition Plan: Observation for IV hydration for AKI. Consults called:  Admission status: Observation/telemetry.   Reubin Milan MD [facility] Pager [phone].  If  7PM-7AM, please contact night-coverage [email or web address] Password [facility]  04/05/2018, 11:50 PM   This document was prepared using Dragon voice recognition software and may contain some unintended transcription errors.

## 2018-04-05 NOTE — ED Provider Notes (Signed)
[facility] EMERGENCY DEPARTMENT Provider Note   CSN: [id number] Arrival date & time: 04/05/18  1554     History   Chief Complaint Chief Complaint  Patient presents with  . Abdominal Pain    HPI Stephanie Combs is a 67 y.o. female.  HPI Pt was seen at 1650.  Per pt, c/o gradual onset and persistence of constant generalized abd "pain" for the past 5 days.  Has been associated with multiple intermittent episodes of N/V.  Describes the abd pain as "aching."  Pt also states she has had constant anterior chest pain for the past 1.5 weeks. Pt states she took her own SL ntg without improvement. Pt states she was also taking her own zofran without improvement. Denies diarrhea, no fevers, no back pain, no rash, no cough/SOB, no palpitations, no black or blood in stools or emesis.      Past Medical History:  Diagnosis Date  . Anxiety   . CAD (coronary artery disease)    a. s/p DES to LAD and angioplasty to D1 in 05/2017  . CHF (congestive heart failure) (HCC)    Diastolic  . Chronic back pain   . Chronic pain   . COPD (chronic obstructive pulmonary disease) ([place])   . Degenerative disk disease   . Diabetes mellitus without complication ([place])   . History of medication noncompliance 11/2017   "the patient frequently self-adjusts medications without notifying her physicians"  . Hypertension   . Hypothyroidism   . On home O2    2.5 L N/C prn  . Pedal edema     Patient Active Problem List   Diagnosis Date Noted  . Cellulitis 01/10/2018  . Cellulitis of right lower extremity 01/07/2018  . Altered mental status 01/06/2018  . Type 2 diabetes mellitus with hypoglycemia without coma ([place]) 11/24/2017  . Anxiety 11/24/2017  . Chronic respiratory failure with hypoxia ([place]) 11/24/2017  . Syncope 10/24/2017  . Bradycardia   . Syncope due to orthostatic hypotension 09/04/2017  . Obesity, Class III, BMI 40-49.9 (morbid obesity) ([place]) 09/04/2017  . Abnormal nuclear stress test 05/19/2017    . Atypical angina ([place]) 05/19/2017  . Edema 05/15/2017  . Skin ulcer ([place]) 01/30/2017  . Tinea cruris 10/10/2016  . Vitamin D deficiency 08/19/2016  . Personal history of noncompliance with medical treatment, presenting hazards to health 06/17/2016  . CAP (community acquired pneumonia) 07/25/2015  . Pressure ulcer 05/02/2015  . Hyponatremia 05/01/2015  . Acute-on-chronic kidney injury ([place]) 05/01/2015  . Uncontrolled type 2 diabetes mellitus with stage 3 chronic kidney disease ([place]) 05/01/2015  . Cellulitis of foot, right 03/24/2015  . Peripheral edema 12/04/2014  . Acute renal failure superimposed on stage 3 chronic kidney disease ([place]) 11/24/2014  . Bilateral edema of lower extremity   . Diabetic ulcer of right great toe ([place])   . Foot ulcer due to secondary DM ([place]) 11/07/2014  . Diabetic ulcer of right foot ([place]) 11/07/2014  . Edema of lower extremity 05/27/2013  . CKD (chronic kidney disease), stage III ([place]) 04/14/2013  . Anemia in CKD (chronic kidney disease) 04/14/2013  . Chest pain 04/14/2013  . Dyspnea 04/14/2013  . Chronic diastolic CHF (congestive heart failure) ([place])   . Hypothyroidism   . Hypoglycemia 08/07/2012  . COPD (chronic obstructive pulmonary disease) ([place]) 08/07/2012  . Morbid obesity ([place]) 08/07/2012  . DM type 2 (diabetes mellitus, type 2) ([place]) 08/07/2012  . HTN (hypertension), benign 08/07/2012    Past Surgical History:  Procedure Laterality Date  .  CORONARY STENT INTERVENTION N/A 05/22/2017   Procedure: CORONARY STENT INTERVENTION;  Surgeon: Martinique, Peter M, MD;  Location: [facility];  Service: Cardiovascular;  Laterality: N/A;  . HERNIA REPAIR    . RIGHT/LEFT HEART CATH AND CORONARY ANGIOGRAPHY N/A 05/19/2017   Procedure: RIGHT/LEFT HEART CATH AND CORONARY ANGIOGRAPHY;  Surgeon: Leonie Man, MD;  Location: [facility];  Service: Cardiovascular;  Laterality: N/A;  . TUBAL LIGATION       OB History   None      Home  Medications    Prior to Admission medications   Medication Sig Start Date End Date Taking? Authorizing Provider  albuterol (PROAIR HFA) 108 (90 BASE) MCG/ACT inhaler Inhale 1 puff into the lungs every 4 (four) hours as needed. For shortness of breath Patient taking differently: Inhale 2 puffs into the lungs every 4 (four) hours as needed. For shortness of breath 05/30/13  Yes Buriev, Arie Sabina, MD  albuterol (PROVENTIL) (2.5 MG/3ML) 0.083% nebulizer solution Take 2.5 mg by nebulization 2 (two) times daily as needed for wheezing or shortness of breath.    Yes [provider]  ALPRAZolam Duanne Moron) 1 MG tablet Take 1 mg by mouth 3 (three) times daily as needed for anxiety or sleep.  01/05/17  Yes [provider]  aspirin EC 81 MG tablet Take 81 mg by mouth daily.   Yes [provider]  atorvastatin (LIPITOR) 80 MG tablet Take 1 tablet (80 mg total) by mouth daily at 6 PM. Patient taking differently: Take 80 mg by mouth every morning.  05/26/17  Yes [name], MD  Cholecalciferol (VITAMIN D3) 5000 units CAPS Take 1 capsule (5,000 Units total) by mouth daily. 08/19/16  Yes Cassandria Anger, MD  clopidogrel (PLAVIX) 75 MG tablet Take 1 tablet (75 mg total) by mouth daily. 05/26/17  Yes [name], MD  docusate sodium (COLACE) 100 MG capsule Take 300 mg by mouth daily.   Yes [provider]  ferrous sulfate 325 (65 FE) MG tablet Take 1 tablet (325 mg total) by mouth daily with breakfast. 02/05/17  Yes [name], MD  furosemide (LASIX) 20 MG tablet Take 1 tablet (20 mg total) by mouth daily. Patient taking differently: Take 20 mg by mouth every 3 (three) days.  12/22/17  Yes Strader, [name] M, PA-C  gabapentin (NEURONTIN) 600 MG tablet Take 600 mg by mouth 4 (four) times daily.   Yes [provider]  Hydrocortisone (GERHARDT'S BUTT CREAM) CREA Apply 1 application topically 3 (three) times daily. 05/26/17  Yes [name], MD  insulin aspart (NOVOLOG) 100 UNIT/ML  injection Inject 0-9 Units into the skin 3 (three) times daily with meals. Patient taking differently: Inject 1-18 Units into the skin 3 (three) times daily with meals. Per sliding scale 05/26/17  Yes [name], MD  insulin detemir (LEVEMIR) 100 UNIT/ML injection Inject 0.4 mLs (40 Units total) into the skin at bedtime. 11/26/17  Yes Tat, Shanon Brow, MD  levothyroxine (SYNTHROID, LEVOTHROID) 175 MCG tablet Take 175 mcg by mouth daily.  12/15/17  Yes [provider]  nitroGLYCERIN (NITROSTAT) 0.4 MG SL tablet Place 0.4 mg under the tongue every 5 (five) minutes as needed for chest pain.  03/26/18  Yes [provider]  nystatin (MYCOSTATIN/NYSTOP) powder Apply topically 2 (two) times daily. 10/10/16  Yes Eber Jones, MD  Omega-3 Fatty Acids (FISH OIL PO) Take 1 capsule by mouth daily.    Yes [provider]  ondansetron (ZOFRAN-ODT) 8 MG  disintegrating tablet Take 8 mg by mouth every 8 (eight) hours as needed for nausea or vomiting.  03/15/18  Yes [provider]  oxyCODONE-acetaminophen (PERCOCET) 10-325 MG tablet Take 1 tablet by mouth every 6 (six) hours as needed for pain.   Yes [provider]  OXYGEN Inhale 2 L into the lungs continuous.   Yes [provider]  pantoprazole (PROTONIX) 40 MG tablet Take 1 tablet (40 mg total) by mouth daily. Patient taking differently: Take 40 mg by mouth 2 (two) times daily.  02/05/17  Yes [name], MD  potassium chloride (MICRO-K) 10 MEQ CR capsule Take 20 mEq by mouth 2 (two) times daily. *May take one additional tablet as needed for cramping   Yes [provider]  tiZANidine (ZANAFLEX) 4 MG tablet Take 4-8 mg by mouth See admin instructions. 4mg  three times daily and 8mg  at bedtime 01/10/17  Yes [provider]  topiramate (TOPAMAX) 50 MG tablet Take 150 mg by mouth every evening.  03/18/18  Yes [provider]  umeclidinium-vilanterol (ANORO ELLIPTA) 62.5-25 MCG/INH AEPB Inhale 1 puff  into the lungs daily. Patient taking differently: Inhale 1 puff into the lungs daily as needed (for shortness of breath).  05/26/17  Yes [name], MD  VICTOZA 18 MG/3ML SOPN Inject 1.2 mg into the skin daily.  01/01/17  Yes [provider]  SURE COMFORT INS SYR 1CC/28G 28G X 1/2" 1 ML MISC USE TO INJECT INSULIN UP TO 4 TIMES DAILY. 03/06/17   Cassandria Anger, MD    Family History Family History  Problem Relation Age of Onset  . Stroke Mother   . Heart attack Father   . Diabetes Brother   . Cerebral palsy Brother   . Pneumonia Brother   . Diabetes Other   . Heart attack Other     Social History Social History   Tobacco Use  . Smoking status: Former Smoker    Packs/day: 0.25    Types: Cigarettes    Last attempt to quit: 11/24/2014    Years since quitting: 3.3  . Smokeless tobacco: Never Used  Substance Use Topics  . Alcohol use: No    Alcohol/week: 0.0 standard drinks  . Drug use: No     Allergies   Dilaudid [hydromorphone hcl]; Midodrine hcl; Actifed cold-allergy [chlorpheniramine-phenylephrine]; Doxycycline; Other; Penicillins; Reglan [metoclopramide]; Valium [diazepam]; and Vistaril [hydroxyzine hcl]   Review of Systems Review of Systems ROS: Statement: All systems negative except as marked or noted in the HPI; Constitutional: Negative for fever and chills. ; ; Eyes: Negative for eye pain, redness and discharge. ; ; ENMT: Negative for ear pain, hoarseness, nasal congestion, sinus pressure and sore throat. ; ; Cardiovascular: +CP. Negative for palpitations, diaphoresis, dyspnea and peripheral edema. ; ; Respiratory: Negative for cough, wheezing and stridor. ; ; Gastrointestinal: +N/V, abd pain. Negative for diarrhea, blood in stool, hematemesis, jaundice and rectal bleeding. . ; ; Genitourinary: Negative for dysuria, flank pain and hematuria. ; ; Musculoskeletal: Negative for back pain and neck pain. Negative for swelling and trauma.; ; Skin: Negative for  pruritus, rash, abrasions, blisters, bruising and skin lesion.; ; Neuro: Negative for headache, lightheadedness and neck stiffness. Negative for weakness, altered level of consciousness, altered mental status, extremity weakness, paresthesias, involuntary movement, seizure and syncope.       Physical Exam Updated Vital Signs BP (!) 64/54   Pulse 72   Resp 18   Ht 5\' 6"  (1.676 m)   Wt 131.1 kg  SpO2 97%   BMI 46.65 kg/m  .  Patient Vitals for the past 24 hrs:  BP Pulse Resp SpO2 Height Weight  04/05/18 1730 (!) 64/54 - 18 - - -  04/05/18 1700 (!) 136/47 - (!) 23 - - -  04/05/18 1630 128/64 72 (!) 24 97 % - -  04/05/18 1557 - - - - 5\' 6"  (1.676 m) 131.1 kg    Physical Exam 1655: Physical examination:  Nursing notes reviewed; Vital signs and O2 SAT reviewed;  Constitutional: Well developed, Well nourished, Well hydrated, In no acute distress; Head:  Normocephalic, atraumatic; Eyes: EOMI, PERRL, No scleral icterus; ENMT: Mouth and pharynx normal, Mucous membranes moist; Neck: Supple, Full range of motion, No lymphadenopathy; Cardiovascular: Regular rate and rhythm, No gallop; Respiratory: Breath sounds clear & equal bilaterally, No wheezes.  Speaking full sentences with ease, Normal respiratory effort/excursion; Chest: Nontender, Movement normal; Abdomen: Soft, +morbidly obese, +diffuse tenderness to palp. Normal bowel sounds; Genitourinary: No CVA tenderness; Extremities: Peripheral pulses normal, No tenderness, No edema, No calf asymmetry.; Neuro: AA&Ox3, Major CN grossly intact.  Speech clear. No gross focal motor deficits in extremities.; Skin: Color normal, Warm, Dry.; Psych:  Rapid, pressured speech.   ED Treatments / Results  Labs (all labs ordered are listed, but only abnormal results are displayed)   EKG EKG Interpretation  Date/Time:  Monday April 05 2018 15:59:11 EST Ventricular Rate:  68 PR Interval:    QRS Duration: 104 QT Interval:  401 QTC Calculation: 427 R  Axis:   70 Text Interpretation:  Sinus rhythm Atrial premature complexes Baseline wander When compared with ECG of 01/06/2018 Artifact is present otherwise no apparent acute changes Confirmed by Francine Graven [phone]) on 04/05/2018 5:53:56 PM   Radiology   Procedures Procedures (including critical care time)  Medications Ordered in ED Medications  ondansetron (ZOFRAN) injection 4 mg (4 mg Intravenous Given 04/05/18 1736)  morphine 2 MG/ML injection 2 mg (2 mg Intravenous Given 04/05/18 1736)  0.9 %  sodium chloride infusion ( Intravenous New Bag/Given 04/05/18 1745)  iopamidol (ISOVUE-300) 61 % injection 50 mL (has no administration in time range)  famotidine (PEPCID) IVPB 20 mg premix (0 mg Intravenous Stopped 04/05/18 1811)  iopamidol (ISOVUE-300) 61 % injection 50 mL (30 mLs Oral Contrast Given 04/05/18 1813)     Initial Impression / Assessment and Plan / ED Course  I have reviewed the triage vital signs and the nursing notes.  Pertinent labs & imaging results that were available during my care of the patient were reviewed by me and considered in my medical decision making (see chart for details).  MDM Reviewed: previous chart, nursing note and vitals Reviewed previous: labs and ECG Interpretation: labs, ECG, x-ray and CT scan   Results for orders placed or performed during the hospital encounter of 04/05/18  Lipase, blood  Result Value Ref Range   Lipase 21 11 - 51 U/L  Comprehensive metabolic panel  Result Value Ref Range   Sodium 138 135 - 145 mmol/L   Potassium 4.5 3.5 - 5.1 mmol/L   Chloride 105 98 - 111 mmol/L   CO2 26 22 - 32 mmol/L   Glucose, Bld 130 (H) 70 - 99 mg/dL   BUN 22 8 - 23 mg/dL   Creatinine, Ser 1.80 (H) 0.44 - 1.00 mg/dL   Calcium 9.1 8.9 - 10.3 mg/dL   Total Protein 7.1 6.5 - 8.1 g/dL   Albumin 3.9 3.5 - 5.0 g/dL   AST 16 15 -  41 U/L   ALT 11 0 - 44 U/L   Alkaline Phosphatase 84 38 - 126 U/L   Total Bilirubin 0.9 0.3 - 1.2 mg/dL   GFR  calc non Af Amer 28 (L) >60 mL/min   GFR calc Af Amer 32 (L) >60 mL/min   Anion gap 7 5 - 15  CBC  Result Value Ref Range   WBC 7.5 4.0 - 10.5 K/uL   RBC 4.32 3.87 - 5.11 MIL/uL   Hemoglobin 11.0 (L) 12.0 - 15.0 g/dL   HCT 37.7 36.0 - 46.0 %   MCV 87.3 80.0 - 100.0 fL   MCH 25.5 (L) 26.0 - 34.0 pg   MCHC 29.2 (L) 30.0 - 36.0 g/dL   RDW 15.8 (H) 11.5 - 15.5 %   Platelets 298 150 - 400 K/uL   nRBC 0.0 0.0 - 0.2 %  Urinalysis, Routine w reflex microscopic  Result Value Ref Range   Color, Urine YELLOW YELLOW   APPearance HAZY (A) CLEAR   Specific Gravity, Urine >1.030 (H) 1.005 - 1.030   pH 5.5 5.0 - 8.0   Glucose, UA NEGATIVE NEGATIVE mg/dL   Hgb urine dipstick SMALL (A) NEGATIVE   Bilirubin Urine SMALL (A) NEGATIVE   Ketones, ur 15 (A) NEGATIVE mg/dL   Protein, ur 100 (A) NEGATIVE mg/dL   Nitrite NEGATIVE NEGATIVE   Leukocytes, UA SMALL (A) NEGATIVE  Troponin I - Once  Result Value Ref Range   Troponin I <0.03 <0.03 ng/mL  Differential  Result Value Ref Range   Neutrophils Relative % 69 %   Neutro Abs 5.4 1.7 - 7.7 K/uL   Lymphocytes Relative 19 %   Lymphs Abs 1.5 0.7 - 4.0 K/uL   Monocytes Relative 8 %   Monocytes Absolute 0.7 0.1 - 1.0 K/uL   Eosinophils Relative 2 %   Eosinophils Absolute 0.1 0.0 - 0.5 K/uL   Basophils Relative 1 %   Basophils Absolute 0.1 0.0 - 0.1 K/uL  Urinalysis, Microscopic (reflex)  Result Value Ref Range   RBC / HPF 6-10 0 - 5 RBC/hpf   WBC, UA 21-50 0 - 5 WBC/hpf   Bacteria, UA MANY (A) NONE SEEN   Squamous Epithelial / LPF 21-50 0 - 5   Hyaline Casts, UA PRESENT   Troponin I - Once  Result Value Ref Range   Troponin I <0.03 <0.03 ng/mL   Ct Abdomen Pelvis Wo Contrast Result Date: 04/05/2018 CLINICAL DATA:  Mid abdominal pain for 5 days.  Nausea and vomiting. EXAM: CT ABDOMEN AND PELVIS WITHOUT CONTRAST TECHNIQUE: Multidetector CT imaging of the abdomen and pelvis was performed following the standard protocol without IV contrast.  COMPARISON:  CT of the abdomen and pelvis 04/30/2015 FINDINGS: Lower chest: Lung bases are clear without focal nodule, mass, or airspace disease. Mitral valve annular calcifications are present. Heart size is normal. No significant pleural or pericardial effusion is present. Hepatobiliary: No focal liver abnormality is seen. No gallstones, gallbladder wall thickening, or biliary dilatation. Pancreas: Unremarkable. No pancreatic ductal dilatation or surrounding inflammatory changes. Spleen: Normal in size without focal abnormality. Adrenals/Urinary Tract: Left adrenal myelolipoma is stable. Right adrenal gland is within normal limits. Two punctate nonobstructing stones are present in the right kidney. A single stone at the upper pole of the left kidney measures 4 mm. There is no obstruction of either collecting system. Ureters are within normal limits to the urinary bladder which is mostly collapsed. No mass lesion is present. Stomach/Bowel: A duodenal diverticulum is  present. Duodenum is otherwise within normal limits. No inflammatory changes evident. Small bowel is within normal limits. Terminal ileum is normal. The appendix is not discretely visualized and may be surgically absent. The ascending and transverse colon are within normal limits. Descending colon is normal. Diverticular changes are present the sigmoid colon without inflammation. Vascular/Lymphatic: Atherosclerotic calcifications are present without aneurysm. No significant adenopathy is present. Reproductive: Uterus and bilateral adnexa are unremarkable. Other: Subcutaneous injection granulomata are again noted. No significant ventral hernia is present. There is no significant free fluid. Musculoskeletal: Advanced degenerative changes are again noted at L4-5. Vertebral body heights are maintained. Advanced facet degenerative changes contribute to significant foraminal stenosis, also greatest at L4-5. Pelvis is within normal limits. Hips are located and  normal bilaterally. IMPRESSION: 1. No acute or focal abnormality to explain the patient's pain. 2. Stable chronic findings. 3.  Aortic Atherosclerosis (ICD10-I70.0). 4. Left adrenal myelolipoma. 5. Nonobstructing nephrolithiasis. No evidence for recent obstruction. 6. Multilevel degenerative changes of the lumbar spine. Electronically Signed   By: San Morelle M.D.   On: 04/05/2018 21:35   Dg Chest 2 View Result Date: 04/05/2018 CLINICAL DATA:  Mid to left chest pain EXAM: CHEST - 2 VIEW COMPARISON:  01/06/2018 FINDINGS: The patient is rotated to the right on today's radiograph, reducing diagnostic sensitivity and specificity. Lower thoracic spondylosis. There could be some volume loss in the right lung causing the rightward shift of cardiac and mediastinal structures, although some of the appearance is rotational. Suspected scarring at the right lung base. Mild cardiomegaly, without edema. IMPRESSION: 1. Stable mild cardiomegaly, without edema. 2. Mild scarring at the right lung base. 3. Possible volume loss in the right hemithorax, versus rightward rotation as a cause for the rightward deviation of cardiac mediastinal structures. Electronically Signed   By: Van Clines M.D.   On: 04/05/2018 20:08     2115:   Doubt ACS as cause for symptoms with normal troponin x2 and unchanged EKG from previous after 1.5 weeks of constant symptoms. Pt continues to c/o abd pain and chest pain despite pain meds. No N/V while in the ED. +UTI, UC pending; IV rocephin ordered. Pt states she "just can't go home!"  T/C returned from [facility]. Olevia Bowens, case discussed, including:  HPI, pertinent PM/SHx, VS/PE, dx testing, ED course and treatment:  Agreeable to admit.    Final Clinical Impressions(s) / ED Diagnoses   Final diagnoses:  None    ED Discharge Orders    None       Francine Graven, DO 04/08/18 1641

## 2018-04-05 NOTE — ED Triage Notes (Signed)
Pt brought to ED via EMS with complaints of abdominal pain x 5 days. Pt states she has had nausea, vomiting but denies diarrhea.

## 2018-04-05 NOTE — Patient Outreach (Signed)
[facility]) Care Management  04/05/2018  Stephanie Combs [birth year] [id number]   Call to [facility] to ensure receipt of request for [place] that was faxed on 03/31/18.  Per customer service, this request has not been received.  Request form faxed again.  BSW will follow up with [name] when [facility] confirms receipt of request.   [name], [place] Social Worker [phone]

## 2018-04-06 ENCOUNTER — Ambulatory Visit: Payer: Self-pay

## 2018-04-06 ENCOUNTER — Encounter (HOSPITAL_COMMUNITY): Payer: Self-pay | Admitting: Family Medicine

## 2018-04-06 ENCOUNTER — Other Ambulatory Visit: Payer: Self-pay | Admitting: *Deleted

## 2018-04-06 ENCOUNTER — Observation Stay (HOSPITAL_COMMUNITY): Payer: Medicare HMO

## 2018-04-06 ENCOUNTER — Other Ambulatory Visit: Payer: Self-pay

## 2018-04-06 DIAGNOSIS — E038 Other specified hypothyroidism: Secondary | ICD-10-CM

## 2018-04-06 DIAGNOSIS — N390 Urinary tract infection, site not specified: Secondary | ICD-10-CM | POA: Diagnosis not present

## 2018-04-06 DIAGNOSIS — I5032 Chronic diastolic (congestive) heart failure: Secondary | ICD-10-CM | POA: Diagnosis not present

## 2018-04-06 DIAGNOSIS — I10 Essential (primary) hypertension: Secondary | ICD-10-CM | POA: Diagnosis not present

## 2018-04-06 DIAGNOSIS — J438 Other emphysema: Secondary | ICD-10-CM | POA: Diagnosis not present

## 2018-04-06 DIAGNOSIS — N179 Acute kidney failure, unspecified: Secondary | ICD-10-CM | POA: Diagnosis not present

## 2018-04-06 DIAGNOSIS — J449 Chronic obstructive pulmonary disease, unspecified: Secondary | ICD-10-CM | POA: Diagnosis not present

## 2018-04-06 DIAGNOSIS — E1122 Type 2 diabetes mellitus with diabetic chronic kidney disease: Secondary | ICD-10-CM | POA: Diagnosis not present

## 2018-04-06 LAB — CBC WITH DIFFERENTIAL/PLATELET
Abs Immature Granulocytes: 0.01 10*3/uL (ref 0.00–0.07)
Basophils Absolute: 0.1 10*3/uL (ref 0.0–0.1)
Basophils Relative: 2 %
Eosinophils Absolute: 0.2 10*3/uL (ref 0.0–0.5)
Eosinophils Relative: 3 %
HCT: 33.8 % — ABNORMAL LOW (ref 36.0–46.0)
Hemoglobin: 10 g/dL — ABNORMAL LOW (ref 12.0–15.0)
Immature Granulocytes: 0 %
Lymphocytes Relative: 32 %
Lymphs Abs: 2 10*3/uL (ref 0.7–4.0)
MCH: 25.6 pg — ABNORMAL LOW (ref 26.0–34.0)
MCHC: 29.6 g/dL — ABNORMAL LOW (ref 30.0–36.0)
MCV: 86.4 fL (ref 80.0–100.0)
Monocytes Absolute: 0.5 10*3/uL (ref 0.1–1.0)
Monocytes Relative: 8 %
Neutro Abs: 3.4 10*3/uL (ref 1.7–7.7)
Neutrophils Relative %: 55 %
Platelets: 268 10*3/uL (ref 150–400)
RBC: 3.91 MIL/uL (ref 3.87–5.11)
RDW: 15.9 % — ABNORMAL HIGH (ref 11.5–15.5)
WBC: 6.2 10*3/uL (ref 4.0–10.5)
nRBC: 0 % (ref 0.0–0.2)

## 2018-04-06 LAB — BASIC METABOLIC PANEL
Anion gap: 8 (ref 5–15)
BUN: 23 mg/dL (ref 8–23)
CO2: 22 mmol/L (ref 22–32)
Calcium: 8.6 mg/dL — ABNORMAL LOW (ref 8.9–10.3)
Chloride: 108 mmol/L (ref 98–111)
Creatinine, Ser: 1.63 mg/dL — ABNORMAL HIGH (ref 0.44–1.00)
GFR calc Af Amer: 36 mL/min — ABNORMAL LOW (ref >60)
GFR calc non Af Amer: 31 mL/min — ABNORMAL LOW (ref >60)
Glucose, Bld: 132 mg/dL — ABNORMAL HIGH (ref 70–99)
Potassium: 5 mmol/L (ref 3.5–5.1)
Sodium: 138 mmol/L (ref 135–145)

## 2018-04-06 LAB — GLUCOSE, CAPILLARY
Glucose-Capillary: 143 mg/dL — ABNORMAL HIGH (ref 70–99)
Glucose-Capillary: 122 mg/dL — ABNORMAL HIGH (ref 70–99)
Glucose-Capillary: 168 mg/dL — ABNORMAL HIGH (ref 70–99)
Glucose-Capillary: 213 mg/dL — ABNORMAL HIGH (ref 70–99)
Glucose-Capillary: 185 mg/dL — ABNORMAL HIGH (ref 70–99)
Glucose-Capillary: 220 mg/dL — ABNORMAL HIGH (ref 70–99)

## 2018-04-06 LAB — TSH: TSH: 11.607 u[IU]/mL — ABNORMAL HIGH (ref 0.350–4.500)

## 2018-04-06 LAB — MRSA PCR SCREENING: MRSA by PCR: POSITIVE — AB

## 2018-04-06 LAB — TROPONIN I
Troponin I: <0.03 ng/mL (ref <0.03)
Troponin I: <0.03 ng/mL (ref <0.03)

## 2018-04-06 LAB — MAGNESIUM: Magnesium: 2.2 mg/dL (ref 1.7–2.4)

## 2018-04-06 MED ORDER — POTASSIUM CHLORIDE CRYS ER 20 MEQ PO TBCR
20.0000 meq | EXTENDED_RELEASE_TABLET | Freq: Two times a day (BID) | ORAL | Status: DC
  Administered 2018-04-07 – 2018-04-09 (×5): 20 meq via ORAL
  Filled 2018-04-06 (×5): qty 1

## 2018-04-06 MED ORDER — INSULIN DETEMIR 100 UNIT/ML ~~LOC~~ SOLN
40.0000 [IU] | Freq: Every day | SUBCUTANEOUS | Status: DC
  Administered 2018-04-06 – 2018-04-08 (×3): 40 [IU] via SUBCUTANEOUS
  Filled 2018-04-06 (×6): qty 0.4

## 2018-04-06 MED ORDER — SODIUM CHLORIDE 0.9 % IV BOLUS
500.0000 mL | Freq: Once | INTRAVENOUS | Status: AC
  Administered 2018-04-06: 500 mL via INTRAVENOUS

## 2018-04-06 MED ORDER — POTASSIUM CHLORIDE CRYS ER 20 MEQ PO TBCR
20.0000 meq | EXTENDED_RELEASE_TABLET | Freq: Two times a day (BID) | ORAL | Status: DC
  Filled 2018-04-06: qty 1

## 2018-04-06 MED ORDER — GABAPENTIN 100 MG PO CAPS
200.0000 mg | ORAL_CAPSULE | Freq: Four times a day (QID) | ORAL | Status: DC
  Administered 2018-04-06 – 2018-04-09 (×12): 200 mg via ORAL
  Filled 2018-04-06 (×11): qty 2

## 2018-04-06 MED ORDER — ASPIRIN EC 81 MG PO TBEC
81.0000 mg | DELAYED_RELEASE_TABLET | Freq: Every day | ORAL | Status: DC
  Administered 2018-04-06 – 2018-04-09 (×4): 81 mg via ORAL
  Filled 2018-04-06 (×3): qty 1

## 2018-04-06 MED ORDER — LIDOCAINE VISCOUS HCL 2 % MT SOLN
15.0000 mL | Freq: Once | OROMUCOSAL | Status: AC
  Administered 2018-04-06: 15 mL via ORAL
  Filled 2018-04-06: qty 15

## 2018-04-06 MED ORDER — ALUM & MAG HYDROXIDE-SIMETH 200-200-20 MG/5ML PO SUSP
30.0000 mL | Freq: Once | ORAL | Status: AC
  Administered 2018-04-06: 30 mL via ORAL
  Filled 2018-04-06: qty 30

## 2018-04-06 MED ORDER — LEVOTHYROXINE SODIUM 75 MCG PO TABS
175.0000 ug | ORAL_TABLET | Freq: Every day | ORAL | Status: DC
  Administered 2018-04-06 – 2018-04-09 (×4): 175 ug via ORAL
  Filled 2018-04-06 (×4): qty 1

## 2018-04-06 MED ORDER — ALBUTEROL SULFATE (2.5 MG/3ML) 0.083% IN NEBU: 2.5000 mg | INHALATION_SOLUTION | Freq: Two times a day (BID) | RESPIRATORY_TRACT | Status: DC | PRN

## 2018-04-06 MED ORDER — TIZANIDINE HCL 4 MG PO TABS
4.0000 mg | ORAL_TABLET | Freq: Three times a day (TID) | ORAL | Status: DC
  Administered 2018-04-06 (×2): 4 mg via ORAL
  Filled 2018-04-06 (×2): qty 1

## 2018-04-06 MED ORDER — ALPRAZOLAM 1 MG PO TABS
1.0000 mg | ORAL_TABLET | Freq: Three times a day (TID) | ORAL | Status: DC | PRN
  Administered 2018-04-06 – 2018-04-09 (×7): 1 mg via ORAL
  Filled 2018-04-06 (×8): qty 1

## 2018-04-06 MED ORDER — SIMETHICONE 80 MG PO CHEW
80.0000 mg | CHEWABLE_TABLET | Freq: Four times a day (QID) | ORAL | Status: DC | PRN
  Administered 2018-04-06: 80 mg via ORAL
  Filled 2018-04-06: qty 1

## 2018-04-06 MED ORDER — MUPIROCIN 2 % EX OINT
1.0000 "application " | TOPICAL_OINTMENT | Freq: Two times a day (BID) | CUTANEOUS | Status: DC
  Administered 2018-04-06 – 2018-04-09 (×6): 1 via NASAL
  Filled 2018-04-06 (×2): qty 22

## 2018-04-06 MED ORDER — TIZANIDINE HCL 4 MG PO TABS: 8.0000 mg | ORAL_TABLET | Freq: Every day | ORAL | Status: DC

## 2018-04-06 MED ORDER — MORPHINE SULFATE (PF) 4 MG/ML IV SOLN
4.0000 mg | INTRAVENOUS | Status: DC | PRN
  Administered 2018-04-06 – 2018-04-08 (×7): 4 mg via INTRAVENOUS
  Filled 2018-04-06 (×7): qty 1

## 2018-04-06 MED ORDER — TOPIRAMATE 25 MG PO TABS
150.0000 mg | ORAL_TABLET | Freq: Every evening | ORAL | Status: DC
  Administered 2018-04-06 – 2018-04-09 (×4): 150 mg via ORAL
  Filled 2018-04-06 (×4): qty 2

## 2018-04-06 MED ORDER — CHLORHEXIDINE GLUCONATE CLOTH 2 % EX PADS
6.0000 | MEDICATED_PAD | Freq: Every day | CUTANEOUS | Status: DC
  Administered 2018-04-07 – 2018-04-09 (×2): 6 via TOPICAL

## 2018-04-06 MED ORDER — NYSTATIN 100000 UNIT/GM EX POWD
Freq: Two times a day (BID) | CUTANEOUS | Status: DC | PRN
  Administered 2018-04-06: 22:00:00 via TOPICAL
  Filled 2018-04-06: qty 15

## 2018-04-06 MED ORDER — SODIUM CHLORIDE 0.9 % IV SOLN
2.0000 g | INTRAVENOUS | Status: AC
  Administered 2018-04-06 – 2018-04-08 (×3): 2 g via INTRAVENOUS
  Filled 2018-04-06 (×3): qty 2

## 2018-04-06 MED ORDER — DOCUSATE SODIUM 100 MG PO CAPS
300.0000 mg | ORAL_CAPSULE | Freq: Every day | ORAL | Status: DC
  Administered 2018-04-06 – 2018-04-09 (×4): 300 mg via ORAL
  Filled 2018-04-06 (×4): qty 3

## 2018-04-06 MED ORDER — GABAPENTIN 300 MG PO CAPS
600.0000 mg | ORAL_CAPSULE | Freq: Four times a day (QID) | ORAL | Status: DC
  Administered 2018-04-06 (×2): 600 mg via ORAL
  Filled 2018-04-06 (×2): qty 2

## 2018-04-06 MED ORDER — FERROUS SULFATE 325 (65 FE) MG PO TABS
325.0000 mg | ORAL_TABLET | Freq: Every day | ORAL | Status: DC
  Administered 2018-04-06 – 2018-04-09 (×4): 325 mg via ORAL
  Filled 2018-04-06 (×4): qty 1

## 2018-04-06 MED ORDER — GABAPENTIN 600 MG PO TABS
600.0000 mg | ORAL_TABLET | Freq: Four times a day (QID) | ORAL | Status: DC
  Filled 2018-04-06 (×6): qty 1

## 2018-04-06 MED ORDER — INSULIN ASPART 100 UNIT/ML ~~LOC~~ SOLN
0.0000 [IU] | Freq: Three times a day (TID) | SUBCUTANEOUS | Status: DC
  Administered 2018-04-06: 4 [IU] via SUBCUTANEOUS
  Administered 2018-04-06: 3 [IU] via SUBCUTANEOUS
  Administered 2018-04-06: 4 [IU] via SUBCUTANEOUS
  Administered 2018-04-07: 3 [IU] via SUBCUTANEOUS
  Administered 2018-04-07: 4 [IU] via SUBCUTANEOUS
  Administered 2018-04-07: 3 [IU] via SUBCUTANEOUS
  Administered 2018-04-08: 4 [IU] via SUBCUTANEOUS
  Administered 2018-04-08 (×2): 7 [IU] via SUBCUTANEOUS
  Administered 2018-04-09: 3 [IU] via SUBCUTANEOUS
  Administered 2018-04-09: 4 [IU] via SUBCUTANEOUS
  Administered 2018-04-09: 7 [IU] via SUBCUTANEOUS

## 2018-04-06 MED ORDER — PANTOPRAZOLE SODIUM 40 MG PO TBEC
40.0000 mg | DELAYED_RELEASE_TABLET | Freq: Two times a day (BID) | ORAL | Status: DC
  Administered 2018-04-06 – 2018-04-09 (×7): 40 mg via ORAL
  Filled 2018-04-06 (×7): qty 1

## 2018-04-06 MED ORDER — ACETAMINOPHEN 325 MG PO TABS
650.0000 mg | ORAL_TABLET | Freq: Four times a day (QID) | ORAL | Status: DC | PRN
  Administered 2018-04-08 – 2018-04-09 (×3): 650 mg via ORAL
  Filled 2018-04-06 (×4): qty 2

## 2018-04-06 MED ORDER — CLOPIDOGREL BISULFATE 75 MG PO TABS
75.0000 mg | ORAL_TABLET | Freq: Every day | ORAL | Status: DC
  Administered 2018-04-06 – 2018-04-09 (×4): 75 mg via ORAL
  Filled 2018-04-06 (×4): qty 1

## 2018-04-06 NOTE — Progress Notes (Signed)
PROGRESS NOTE    GAYATRI TEASDALE  ERX:[id number]  DOB: [birth year]  DOA: 04/05/2018 PCP: [name], MD   Brief Admission Hx:  67 y.o. female with medical history significant of anxiety, history of CAD (s/p DES to LAD and angioplasty to D1 in 05/2017), chronic diastolic CHF, chronic back pain, history of degenerative disc disease, COPD, type 2 diabetes, hypertension, hypothyroidism, chronic pedal edema, morbid obesity, chronic anemia, recurrent syncopal episodes with several admissions this year due to this who is coming to the emergency department due to abdominal pain, nausea and emesis for the past 5 days.  She was diagnosed with a UTI.   MDM/Assessment & Plan:   1. UTI -IV ceftriaxone and follow urine culture and sensitivity. 2. Dehydration with acute kidney injury-treating with IV fluids and temporarily holding torsemide.  Repeat BMP. 3. COPD- does not appear to be in exacerbation, continue home respiratory medications. 4. Type 2 diabetes mellitus- sliding scale coverage ordered.  Car modified diet.  Follow CBG. 5. Essential hypertension- no blood pressure medications due to her recent history of recurrent hypotension and syncope.  She had been taken off all of her blood pressure medications by cardiology.  Holding torsemide temporarily. 6. Chronic diastolic CHF- monitor closely with fluids, holding torsemide temporarily. 7. Hypothyroidism- continue levothyroxine, check TSH. 8. Anemia and CKD- monitoring. 9. GAD- alprazolam ordered as needed. 10. Chest pain-patient complained of 8/10 chest pain, ordered EKG, troponins, continuous telemetry monitoring, GI cocktail given, portable chest x-ray ordered.  Follow closely. 11. Hypotension-patient had some hypotension after Zanaflex was given.  I am discontinuing that as the side effect of this is hypotension and bradycardia.  DVT prophylaxis: Subcu heparin Code Status: Full code Family Communication: Shin updated at bedside Disposition  Plan: Home when medically stable   Antimicrobials:  Ceftriaxone 11/25  Subjective: Patient complained of chest pain when waking up this afternoon 8/10 in the center of the chest radiating into the left jaw and into the back  Objective: Vitals:   04/06/18 0556 04/06/18 0851 04/06/18 1507 04/06/18 1525  BP: (!) 134/59  (!) 98/47 (!) 95/58  Pulse: 67  (!) 56   Resp: 20  16   Temp: 98.3 F (36.8 C)  98.2 F (36.8 C)   TempSrc: Oral  Oral   SpO2: 98% 98% 99%   Weight:      Height:        Intake/Output Summary (Last 24 hours) at 04/06/2018 1619 Last data filed at 04/05/2018 1811 Gross per 24 hour  Intake 100 ml  Output -  Net 100 ml   Filed Weights   04/05/18 1557 04/06/18 0017  Weight: 131.1 kg 131.3 kg     REVIEW OF SYSTEMS  As per history otherwise all reviewed and reported negative  Exam:  General exam: Morbidly obese female sitting up in the bed she is awake alert talking oriented x3 in no apparent distress. Respiratory system: Bilateral breath sounds fairly clear no increased work of breathing. Cardiovascular system: S1 & S2 heard, RRR.  2++ edema bilateral lower extremities Gastrointestinal system: Abdomen is nondistended, soft and nontender. Normal bowel sounds heard. Central nervous system: Alert and oriented. No focal neurological deficits. Extremities: 2++ pitting edema bilateral lower extremities.  Data Reviewed: Basic Metabolic Panel: Recent Labs  Lab 04/05/18 1629 04/05/18 2026 04/06/18 0551  NA 138  --  138  K 4.5  --  5.0  CL 105  --  108  CO2 26  --  22  GLUCOSE 130*  --  132*  BUN 22  --  23  CREATININE 1.80*  --  1.63*  CALCIUM 9.1  --  8.6*  MG  --  2.2  --    Liver Function Tests: Recent Labs  Lab 04/05/18 1629  AST 16  ALT 11  ALKPHOS 84  BILITOT 0.9  PROT 7.1  ALBUMIN 3.9   Recent Labs  Lab 04/05/18 1629  LIPASE 21   No results for input(s): AMMONIA in the last 168 hours. CBC: Recent Labs  Lab 04/05/18 1629  04/06/18 0551  WBC 7.5 6.2  NEUTROABS 5.4 3.4  HGB 11.0* 10.0*  HCT 37.7 33.8*  MCV 87.3 86.4  PLT 298 268   Cardiac Enzymes: Recent Labs  Lab 04/05/18 1728 04/05/18 2026  TROPONINI <0.03 <0.03   CBG (last 3)  Recent Labs    04/06/18 0729 04/06/18 1128 04/06/18 1526  GLUCAP 122* 168* 213*   Recent Results (from the past 240 hour(s))  MRSA PCR Screening     Status: Abnormal   Collection Time: 04/06/18  9:46 AM  Result Value Ref Range Status   MRSA by PCR POSITIVE (A) NEGATIVE Final    Comment:        The GeneXpert MRSA Assay (FDA approved for NASAL specimens only), is one component of a comprehensive MRSA colonization surveillance program. It is not intended to diagnose MRSA infection nor to guide or monitor treatment for MRSA infections. RESULT CALLED TO, READ BACK BY AND VERIFIED WITH: COX,D. AT 1405 ON 04/06/2018 BY Elza Rafter. Performed at [facility], [address] [place], [place] [address]      Studies: Ct Abdomen Pelvis Wo Contrast  Result Date: 04/05/2018 CLINICAL DATA:  Mid abdominal pain for 5 days.  Nausea and vomiting. EXAM: CT ABDOMEN AND PELVIS WITHOUT CONTRAST TECHNIQUE: Multidetector CT imaging of the abdomen and pelvis was performed following the standard protocol without IV contrast. COMPARISON:  CT of the abdomen and pelvis 04/30/2015 FINDINGS: Lower chest: Lung bases are clear without focal nodule, mass, or airspace disease. Mitral valve annular calcifications are present. Heart size is normal. No significant pleural or pericardial effusion is present. Hepatobiliary: No focal liver abnormality is seen. No gallstones, gallbladder wall thickening, or biliary dilatation. Pancreas: Unremarkable. No pancreatic ductal dilatation or surrounding inflammatory changes. Spleen: Normal in size without focal abnormality. Adrenals/Urinary Tract: Left adrenal myelolipoma is stable. Right adrenal gland is within normal limits. Two punctate nonobstructing stones  are present in the right kidney. A single stone at the upper pole of the left kidney measures 4 mm. There is no obstruction of either collecting system. Ureters are within normal limits to the urinary bladder which is mostly collapsed. No mass lesion is present. Stomach/Bowel: A duodenal diverticulum is present. Duodenum is otherwise within normal limits. No inflammatory changes evident. Small bowel is within normal limits. Terminal ileum is normal. The appendix is not discretely visualized and may be surgically absent. The ascending and transverse colon are within normal limits. Descending colon is normal. Diverticular changes are present the sigmoid colon without inflammation. Vascular/Lymphatic: Atherosclerotic calcifications are present without aneurysm. No significant adenopathy is present. Reproductive: Uterus and bilateral adnexa are unremarkable. Other: Subcutaneous injection granulomata are again noted. No significant ventral hernia is present. There is no significant free fluid. Musculoskeletal: Advanced degenerative changes are again noted at L4-5. Vertebral body heights are maintained. Advanced facet degenerative changes contribute to significant foraminal stenosis, also greatest at L4-5. Pelvis is within normal limits. Hips are located and normal bilaterally. IMPRESSION: 1.  No acute or focal abnormality to explain the patient's pain. 2. Stable chronic findings. 3.  Aortic Atherosclerosis (ICD10-I70.0). 4. Left adrenal myelolipoma. 5. Nonobstructing nephrolithiasis. No evidence for recent obstruction. 6. Multilevel degenerative changes of the lumbar spine. Electronically Signed   By: San Morelle M.D.   On: 04/05/2018 21:35   Dg Chest 2 View  Result Date: 04/05/2018 CLINICAL DATA:  Mid to left chest pain EXAM: CHEST - 2 VIEW COMPARISON:  01/06/2018 FINDINGS: The patient is rotated to the right on today's radiograph, reducing diagnostic sensitivity and specificity. Lower thoracic spondylosis.  There could be some volume loss in the right lung causing the rightward shift of cardiac and mediastinal structures, although some of the appearance is rotational. Suspected scarring at the right lung base. Mild cardiomegaly, without edema. IMPRESSION: 1. Stable mild cardiomegaly, without edema. 2. Mild scarring at the right lung base. 3. Possible volume loss in the right hemithorax, versus rightward rotation as a cause for the rightward deviation of cardiac mediastinal structures. Electronically Signed   By: Van Clines M.D.   On: 04/05/2018 20:08   Dg Chest Port 1 View  Result Date: 04/06/2018 CLINICAL DATA:  Chest pain EXAM: PORTABLE CHEST 1 VIEW COMPARISON:  04/05/2018, 01/06/2018, 01/30/2017 FINDINGS: Rotated patient. No acute opacity or pleural effusion. Stable cardiomediastinal silhouette. No pneumothorax. Similar appearance rightward shift of mediastinal contents. IMPRESSION: No active disease.  Mild cardiomegaly. Electronically Signed   By: Donavan Foil M.D.   On: 04/06/2018 15:48     Scheduled Meds: . aspirin EC  81 mg Oral Daily  . [START ON 04/07/2018] Chlorhexidine Gluconate Cloth  6 each Topical Q0600  . clopidogrel  75 mg Oral Daily  . docusate sodium  300 mg Oral Daily  . ferrous sulfate  325 mg Oral Q breakfast  . gabapentin  200 mg Oral QID  . heparin  5,000 Units Subcutaneous Q8H  . insulin aspart  0-20 Units Subcutaneous TID WC  . insulin detemir  40 Units Subcutaneous QHS  . levothyroxine  175 mcg Oral Q0600  . mupirocin ointment  1 application Nasal BID  . pantoprazole  40 mg Oral BID  . [START ON 04/07/2018] potassium chloride  20 mEq Oral BID  . topiramate  150 mg Oral QPM   Continuous Infusions: . sodium chloride 100 mL/hr at 04/06/18 1426  . cefTRIAXone (ROCEPHIN)  IV    . sodium chloride      Principal Problem:   AKI (acute kidney injury) ([place]) Active Problems:   UTI (urinary tract infection)   COPD (chronic obstructive pulmonary disease) (HCC)    DM type 2 (diabetes mellitus, type 2) (HCC)   HTN (hypertension), benign   Chronic diastolic CHF (congestive heart failure) (HCC)   Hypothyroidism   Anemia in CKD (chronic kidney disease)   Anxiety   Time spent:   Irwin Brakeman, MD, FAAFP [facility] Pager [phone]  If 7PM-7AM, please contact night-coverage [email or web address] Password [email or web address] 04/06/2018, 4:19 PM    LOS: 0 days

## 2018-04-06 NOTE — Progress Notes (Signed)
Patient has positive MRSA PCR today. Positive MRSA PCR standing order set in place. Notified Dr. Wynetta Emery. Stated okay. Patient educated on MRSA PCR swab and contact precautions. Donavan Foil, RN

## 2018-04-06 NOTE — Progress Notes (Signed)
Patient c/o headache this afternoon during vital sign check. States she usually takes "extra topamax or a percocet for headaches at home." BP soft this afternoon at 98/47. text paged Dr. Wynetta Emery to notify. Patient then stated "my chest and back are hurting and so is my jaw and arm. It started when I woke up few minutes ago." Paged Dr. Wynetta Emery to notify. Orders placed for troponins, EKG, CBG check, manual BP and fluid bolus. MD arrived to see patient during manual BP check. Notified BP 98/58 manually. HR 56. No nitro for chest pain at this time d/t low BP and HR. Notified RT of need for EKG. Portable CXR in process. Denies palpitations or shortness of breath. MD aware of need for new IV access since IV had come out and was leaking. Stated okay. Will start fluid bolus with new IV access. Nursing supervisor aware of need for IV access. Donavan Foil, RN

## 2018-04-06 NOTE — Patient Outreach (Signed)
[place]) Care Management  04/06/2018  Stephanie Combs May 03, 1951 [id number]   Confirmation of receipt of request for [place] from [facility].  Unable to process request because form indicated "beneficiary is not medically stable" therefore [facility] deemed that these services are not appropriate at this time.   Messaged [name] from [place] for update on request for home modifications.   Stephanie Combs is currently inpatient at [facility].  Will follow up with her upon discharge.     [name], BSW Social Worker [phone]

## 2018-04-06 NOTE — Care Management Note (Signed)
Case Management Note  Patient Details  Name: Stephanie Combs MRN: [id number] Date of Birth: 04/14/1951  Subjective/Objective:       Admitted with abdominal pain. From home, active with [facility] and [facility] through [facility] recently [place]. Per notes [facility] has received approval for aid services.   Action/Plan: Pt would like [facility] to start services back after DC. Santiago Glad, [facility] rep, aware and will pull HH orders once put in. CM will cont to follow for DC planning.              Expected Discharge Date:       04/07/18           Expected Discharge Plan:  [place]  In-House Referral:  NA  Discharge planning Services  CM Consult  Post Acute Care Choice:  Home Health, NA Choice offered to:     DME Arranged:    DME Agency:     HH Arranged:  RN, PT HH Agency:  [place]  Status of Service:  In process, will continue to follow  If discussed at Long Length of Stay Meetings, dates discussed:    Additional Comments:  Sherald Barge, RN 04/06/2018, 2:06 PM

## 2018-04-06 NOTE — Care Management Obs Status (Signed)
[place] NOTIFICATION   Patient Details  Name: Stephanie Combs MRN: [id number] Date of Birth: 1950-11-16   Medicare Observation Status Notification Given:  Yes    Sherald Barge, RN 04/06/2018, 2:09 PM

## 2018-04-06 NOTE — Progress Notes (Signed)
Patient noted to have her belongings bag this evening with medication bottles clearly visible. Reminded patient that home medications are to be sent home with family member or sent to the pharmacy for safe keeping until discharge per hospital policy. Patient stated "No, I'm keeping my medicines because last time I lost them when someone broke in my home. Dr Wynetta Emery knows I have them and I know not to take them." encouraged patient to allow nursing staff to lock up in pharmacy for safety but patient adamantly refused. Discussed with night shift RN and notified nursing supervisor. States she will also speak with patient regarding medication policy. Donavan Foil, RN

## 2018-04-06 NOTE — Progress Notes (Signed)
NS 500 ml bolus administered with new IV access. MD aware of delay due to loss of IV access. Offered tylenol as ordered PRN for headache, pt refused stating "I don't take tylenol. I'll wait until tonight for my topamax." no c/o chest pain or discomfort at this time. Nursing to continue monitoring. Donavan Foil, RN

## 2018-04-07 DIAGNOSIS — I5032 Chronic diastolic (congestive) heart failure: Secondary | ICD-10-CM | POA: Diagnosis not present

## 2018-04-07 DIAGNOSIS — J438 Other emphysema: Secondary | ICD-10-CM | POA: Diagnosis not present

## 2018-04-07 DIAGNOSIS — N179 Acute kidney failure, unspecified: Secondary | ICD-10-CM | POA: Diagnosis not present

## 2018-04-07 DIAGNOSIS — I10 Essential (primary) hypertension: Secondary | ICD-10-CM | POA: Diagnosis not present

## 2018-04-07 LAB — GLUCOSE, CAPILLARY
Glucose-Capillary: 143 mg/dL — ABNORMAL HIGH (ref 70–99)
Glucose-Capillary: 158 mg/dL — ABNORMAL HIGH (ref 70–99)
Glucose-Capillary: 150 mg/dL — ABNORMAL HIGH (ref 70–99)
Glucose-Capillary: 220 mg/dL — ABNORMAL HIGH (ref 70–99)

## 2018-04-07 LAB — URINE CULTURE: Culture: NO GROWTH

## 2018-04-07 LAB — TROPONIN I: Troponin I: <0.03 ng/mL (ref <0.03)

## 2018-04-07 MED ORDER — BISACODYL 5 MG PO TBEC
10.0000 mg | DELAYED_RELEASE_TABLET | Freq: Every day | ORAL | Status: DC | PRN
  Administered 2018-04-07: 10 mg via ORAL
  Filled 2018-04-07: qty 2

## 2018-04-07 NOTE — Progress Notes (Signed)
PROGRESS NOTE  Stephanie Combs  YQM:[id number]  DOB: [birth year]  DOA: 04/05/2018 PCP: [name], MD   Brief Admission Hx:  67 y.o. female with medical history significant of anxiety, history of CAD (s/p DES to LAD and angioplasty to D1 in 05/2017), chronic diastolic CHF, chronic back pain, history of degenerative disc disease, COPD, type 2 diabetes, hypertension, hypothyroidism, chronic pedal edema, morbid obesity, chronic anemia, recurrent syncopal episodes with several admissions this year due to this who is coming to the emergency department due to abdominal pain, nausea and emesis for the past 5 days.  She was diagnosed with a UTI.   MDM/Assessment & Plan:   1. UTI -IV ceftriaxone and follow urine culture and sensitivity. 2. Dehydration with acute kidney injury-treated with gentle IV fluids and temporarily holding torsemide.   3. COPD- does not appear to be in exacerbation, continue home respiratory medications. 4. Type 2 diabetes mellitus- sliding scale coverage ordered.  Car modified diet.  Follow CBG. 5. Essential hypertension- no blood pressure medications due to her recent history of recurrent hypotension and syncope.  She had been taken off all of her blood pressure medications by cardiology.  Holding torsemide temporarily. 6. Chronic diastolic CHF- monitor closely with fluids, holding torsemide temporarily. 7. Generalized weakness - Pt says she is too weak to go home. Will obtain PT eval.  8. Hypothyroidism- continue levothyroxine, check TSH. 9. Anemia and CKD- monitoring. 10. GAD- alprazolam ordered as needed. 11. Chest pain-patient complained of 8/10 chest pain, ordered EKG, troponins, continuous telemetry monitoring, GI cocktail given, portable chest x-ray ordered.  Follow closely. 12. Hypotension-patient had some hypotension after Zanaflex was given.  I am discontinuing that as the side effect of this is hypotension and bradycardia.  DVT prophylaxis: Subcu heparin Code  Status: Full code Family Communication: Shin updated at bedside Disposition Plan: Home   Antimicrobials:  Ceftriaxone 11/25  Subjective: Patient says she continues to feel weak and says she is too weak to go home today.   Objective: Vitals:   04/06/18 1525 04/06/18 1831 04/06/18 2150 04/07/18 0510  BP: (!) 95/58 104/62 (!) 127/48 136/88  Pulse:   67 68  Resp:   20 (!) 21  Temp:   98.2 F (36.8 C) 98.1 F (36.7 C)  TempSrc:   Oral Oral  SpO2:   100% 100%  Weight:      Height:        Intake/Output Summary (Last 24 hours) at 04/07/2018 1310 Last data filed at 04/07/2018 0900 Gross per 24 hour  Intake 861.51 ml  Output 850 ml  Net 11.51 ml   Filed Weights   04/05/18 1557 04/06/18 0017  Weight: 131.1 kg 131.3 kg   REVIEW OF SYSTEMS  As per history otherwise all reviewed and reported negative  Exam:  General exam: Morbidly obese female sitting up in the bed she is awake alert talking oriented x3 in no apparent distress. Respiratory system: Bilateral breath sounds fairly clear no increased work of breathing. Cardiovascular system: S1 & S2 heard, RRR.  2++ edema bilateral lower extremities (chronic) Gastrointestinal system: Abdomen is nondistended, soft and nontender. Normal bowel sounds heard. Central nervous system: Alert and oriented. No focal neurological deficits. Extremities: 2++ pitting edema bilateral lower extremities (chronic).   Data Reviewed: Basic Metabolic Panel: Recent Labs  Lab 04/05/18 1629 04/05/18 2026 04/06/18 0551  NA 138  --  138  K 4.5  --  5.0  CL 105  --  108  CO2 26  --  22  GLUCOSE 130*  --  132*  BUN 22  --  23  CREATININE 1.80*  --  1.63*  CALCIUM 9.1  --  8.6*  MG  --  2.2  --    Liver Function Tests: Recent Labs  Lab 04/05/18 1629  AST 16  ALT 11  ALKPHOS 84  BILITOT 0.9  PROT 7.1  ALBUMIN 3.9   Recent Labs  Lab 04/05/18 1629  LIPASE 21   No results for input(s): AMMONIA in the last 168 hours. CBC: Recent  Labs  Lab 04/05/18 1629 04/06/18 0551  WBC 7.5 6.2  NEUTROABS 5.4 3.4  HGB 11.0* 10.0*  HCT 37.7 33.8*  MCV 87.3 86.4  PLT 298 268   Cardiac Enzymes: Recent Labs  Lab 04/05/18 1728 04/05/18 2026 04/06/18 1528 04/06/18 2111 04/07/18 0256  TROPONINI <0.03 <0.03 <0.03 <0.03 <0.03   CBG (last 3)  Recent Labs    04/06/18 2147 04/07/18 0721 04/07/18 1115  GLUCAP 220* 143* 158*   Recent Results (from the past 240 hour(s))  Urine culture     Status: None   Collection Time: 04/05/18  5:29 PM  Result Value Ref Range Status   Specimen Description   Final    URINE, RANDOM Performed at [facility], [address] [place], [place] [address]    Special Requests   Final    NONE Performed at [facility], [address] [place], [place] [address]    Culture   Final    NO GROWTH Performed at [facility], [place] [address] [place], [place] [address]    Report Status 04/07/2018 FINAL  Final  MRSA PCR Screening     Status: Abnormal   Collection Time: 04/06/18  9:46 AM  Result Value Ref Range Status   MRSA by PCR POSITIVE (A) NEGATIVE Final    Comment:        The GeneXpert MRSA Assay (FDA approved for NASAL specimens only), is one component of a comprehensive MRSA colonization surveillance program. It is not intended to diagnose MRSA infection nor to guide or monitor treatment for MRSA infections. RESULT CALLED TO, READ BACK BY AND VERIFIED WITH: COX,D. AT 1405 ON 04/06/2018 BY Elza Rafter. Performed at [facility], [address] [place], [place] [address]      Studies: Ct Abdomen Pelvis Wo Contrast  Result Date: 04/05/2018 CLINICAL DATA:  Mid abdominal pain for 5 days.  Nausea and vomiting. EXAM: CT ABDOMEN AND PELVIS WITHOUT CONTRAST TECHNIQUE: Multidetector CT imaging of the abdomen and pelvis was performed following the standard protocol without IV contrast. COMPARISON:  CT of the abdomen and pelvis 04/30/2015 FINDINGS: Lower chest: Lung bases are clear  without focal nodule, mass, or airspace disease. Mitral valve annular calcifications are present. Heart size is normal. No significant pleural or pericardial effusion is present. Hepatobiliary: No focal liver abnormality is seen. No gallstones, gallbladder wall thickening, or biliary dilatation. Pancreas: Unremarkable. No pancreatic ductal dilatation or surrounding inflammatory changes. Spleen: Normal in size without focal abnormality. Adrenals/Urinary Tract: Left adrenal myelolipoma is stable. Right adrenal gland is within normal limits. Two punctate nonobstructing stones are present in the right kidney. A single stone at the upper pole of the left kidney measures 4 mm. There is no obstruction of either collecting system. Ureters are within normal limits to the urinary bladder which is mostly collapsed. No mass lesion is present. Stomach/Bowel: A duodenal diverticulum is present. Duodenum is otherwise within normal limits. No inflammatory changes evident. Small bowel  is within normal limits. Terminal ileum is normal. The appendix is not discretely visualized and may be surgically absent. The ascending and transverse colon are within normal limits. Descending colon is normal. Diverticular changes are present the sigmoid colon without inflammation. Vascular/Lymphatic: Atherosclerotic calcifications are present without aneurysm. No significant adenopathy is present. Reproductive: Uterus and bilateral adnexa are unremarkable. Other: Subcutaneous injection granulomata are again noted. No significant ventral hernia is present. There is no significant free fluid. Musculoskeletal: Advanced degenerative changes are again noted at L4-5. Vertebral body heights are maintained. Advanced facet degenerative changes contribute to significant foraminal stenosis, also greatest at L4-5. Pelvis is within normal limits. Hips are located and normal bilaterally. IMPRESSION: 1. No acute or focal abnormality to explain the patient's pain.  2. Stable chronic findings. 3.  Aortic Atherosclerosis (ICD10-I70.0). 4. Left adrenal myelolipoma. 5. Nonobstructing nephrolithiasis. No evidence for recent obstruction. 6. Multilevel degenerative changes of the lumbar spine. Electronically Signed   By: San Morelle M.D.   On: 04/05/2018 21:35   Dg Chest 2 View  Result Date: 04/05/2018 CLINICAL DATA:  Mid to left chest pain EXAM: CHEST - 2 VIEW COMPARISON:  01/06/2018 FINDINGS: The patient is rotated to the right on today's radiograph, reducing diagnostic sensitivity and specificity. Lower thoracic spondylosis. There could be some volume loss in the right lung causing the rightward shift of cardiac and mediastinal structures, although some of the appearance is rotational. Suspected scarring at the right lung base. Mild cardiomegaly, without edema. IMPRESSION: 1. Stable mild cardiomegaly, without edema. 2. Mild scarring at the right lung base. 3. Possible volume loss in the right hemithorax, versus rightward rotation as a cause for the rightward deviation of cardiac mediastinal structures. Electronically Signed   By: Van Clines M.D.   On: 04/05/2018 20:08   Dg Chest Port 1 View  Result Date: 04/06/2018 CLINICAL DATA:  Chest pain EXAM: PORTABLE CHEST 1 VIEW COMPARISON:  04/05/2018, 01/06/2018, 01/30/2017 FINDINGS: Rotated patient. No acute opacity or pleural effusion. Stable cardiomediastinal silhouette. No pneumothorax. Similar appearance rightward shift of mediastinal contents. IMPRESSION: No active disease.  Mild cardiomegaly. Electronically Signed   By: Donavan Foil M.D.   On: 04/06/2018 15:48   Scheduled Meds: . aspirin EC  81 mg Oral Daily  . Chlorhexidine Gluconate Cloth  6 each Topical Q0600  . clopidogrel  75 mg Oral Daily  . docusate sodium  300 mg Oral Daily  . ferrous sulfate  325 mg Oral Q breakfast  . gabapentin  200 mg Oral QID  . heparin  5,000 Units Subcutaneous Q8H  . insulin aspart  0-20 Units Subcutaneous TID WC   . insulin detemir  40 Units Subcutaneous QHS  . levothyroxine  175 mcg Oral Q0600  . mupirocin ointment  1 application Nasal BID  . pantoprazole  40 mg Oral BID  . potassium chloride  20 mEq Oral BID  . topiramate  150 mg Oral QPM   Continuous Infusions: . sodium chloride 65 mL/hr at 04/06/18 2145  . cefTRIAXone (ROCEPHIN)  IV Stopped (04/06/18 2235)    Principal Problem:   AKI (acute kidney injury) ([place]) Active Problems:   UTI (urinary tract infection)   COPD (chronic obstructive pulmonary disease) (HCC)   DM type 2 (diabetes mellitus, type 2) (HCC)   HTN (hypertension), benign   Chronic diastolic CHF (congestive heart failure) (HCC)   Hypothyroidism   Anemia in CKD (chronic kidney disease)   Anxiety   Time spent:   Irwin Brakeman, MD, FAAFP [facility]  Pager [phone]  If 7PM-7AM, please contact night-coverage [email or web address] Password [email or web address] 04/07/2018, 1:10 PM    LOS: 0 days

## 2018-04-07 NOTE — Progress Notes (Signed)
PT Cancellation Note  Patient Details Name: Stephanie Combs MRN: [id number] DOB: 1951/01/11   Cancelled Treatment:    Reason Eval/Treat Not Completed: Pain limiting ability to participate.  Patient declined therapy secondary to c/o chest pain - nurse aware.   3:45 PM, 04/07/18 Lonell Grandchild, MPT Physical Therapist with [facility] [phone] office [phone] mobile phone

## 2018-04-08 DIAGNOSIS — N179 Acute kidney failure, unspecified: Secondary | ICD-10-CM | POA: Diagnosis not present

## 2018-04-08 DIAGNOSIS — J438 Other emphysema: Secondary | ICD-10-CM | POA: Diagnosis not present

## 2018-04-08 DIAGNOSIS — I10 Essential (primary) hypertension: Secondary | ICD-10-CM | POA: Diagnosis not present

## 2018-04-08 DIAGNOSIS — I5032 Chronic diastolic (congestive) heart failure: Secondary | ICD-10-CM | POA: Diagnosis not present

## 2018-04-08 LAB — GLUCOSE, CAPILLARY
Glucose-Capillary: 191 mg/dL — ABNORMAL HIGH (ref 70–99)
Glucose-Capillary: 210 mg/dL — ABNORMAL HIGH (ref 70–99)
Glucose-Capillary: 244 mg/dL — ABNORMAL HIGH (ref 70–99)
Glucose-Capillary: 191 mg/dL — ABNORMAL HIGH (ref 70–99)

## 2018-04-08 MED ORDER — INSULIN ASPART 100 UNIT/ML ~~LOC~~ SOLN
10.0000 [IU] | Freq: Three times a day (TID) | SUBCUTANEOUS | Status: DC
  Administered 2018-04-09 (×3): 10 [IU] via SUBCUTANEOUS

## 2018-04-08 MED ORDER — INSULIN ASPART 100 UNIT/ML ~~LOC~~ SOLN
6.0000 [IU] | Freq: Three times a day (TID) | SUBCUTANEOUS | Status: DC
  Administered 2018-04-08 (×2): 6 [IU] via SUBCUTANEOUS

## 2018-04-08 MED ORDER — FLEET ENEMA 7-19 GM/118ML RE ENEM: 1.0000 | ENEMA | Freq: Every day | RECTAL | Status: DC | PRN

## 2018-04-08 MED ORDER — ALBUTEROL SULFATE (2.5 MG/3ML) 0.083% IN NEBU
2.5000 mg | INHALATION_SOLUTION | Freq: Two times a day (BID) | RESPIRATORY_TRACT | Status: DC
  Administered 2018-04-08 – 2018-04-09 (×2): 2.5 mg via RESPIRATORY_TRACT
  Filled 2018-04-08 (×2): qty 3

## 2018-04-08 MED ORDER — ALBUTEROL SULFATE (2.5 MG/3ML) 0.083% IN NEBU: 2.5000 mg | INHALATION_SOLUTION | Freq: Two times a day (BID) | RESPIRATORY_TRACT | Status: DC

## 2018-04-08 MED ORDER — FUROSEMIDE 20 MG PO TABS
20.0000 mg | ORAL_TABLET | Freq: Every day | ORAL | Status: DC
  Administered 2018-04-08 – 2018-04-09 (×2): 20 mg via ORAL
  Filled 2018-04-08 (×3): qty 1

## 2018-04-08 NOTE — Progress Notes (Signed)
PROGRESS NOTE  Stephanie Combs  OAC:[id number]  DOB: January 23, 1951  DOA: 04/05/2018 PCP: [name], MD  Brief Admission Hx:  67 y.o. female with medical history significant of anxiety, history of CAD (s/p DES to LAD and angioplasty to D1 in 05/2017), chronic diastolic CHF, chronic back pain, history of degenerative disc disease, COPD, type 2 diabetes, hypertension, hypothyroidism, chronic pedal edema, morbid obesity, chronic anemia, recurrent syncopal episodes with several admissions this year due to this who is coming to the emergency department due to abdominal pain, nausea and emesis for the past 5 days.  She was diagnosed with a UTI.   MDM/Assessment & Plan:   1. UTI -IV ceftriaxone x 3 days then discontinue.  2. Dehydration with acute kidney injury-treated with gentle IV fluids.   3. COPD- does not appear to be in exacerbation, continue home respiratory medications. 4. Type 2 diabetes mellitus- sliding scale coverage ordered.  Car modified diet.  Follow CBG. 5. Essential hypertension- no blood pressure medications due to her recent history of recurrent hypotension and syncope.  She had been taken off all of her blood pressure medications by cardiology.   6. Chronic diastolic CHF- monitor closely with fluids, resumed home lasix. 7. Generalized weakness - Pt says she is too weak to go home. Will obtain PT eval.  8. Hypothyroidism- continue levothyroxine. TSH elevated, but suspect noncompliance with taking medication.  9. Anemia and CKD- monitoring. 10. GAD- alprazolam ordered as needed. 11. Chest pain-patient complained of 8/10 chest pain, EKG, troponins and telemetry monitoring has been negative, GI cocktail given with improvement.   Follow closely. 12. Hypotension-patient had some hypotension after Zanaflex was given.  I am discontinuing that as the side effect of this is hypotension and bradycardia. 13. Constipation - pt requesting enema.  14. Skin breakdown - small area under pannus -  consult to wound care nurse.  Continue local wound care.   DVT prophylaxis: Subcu heparin Code Status: Full code Family Communication: patient updated at bedside Disposition Plan: Home   Antimicrobials:  Ceftriaxone 11/25  Subjective: Patient says she has intermittent chest pains.   Objective: Vitals:   04/07/18 2032 04/07/18 2132 04/08/18 0616 04/08/18 0659  BP:  127/73 135/70   Pulse:  61 64   Resp:  18 (!) 21   Temp:  98.2 F (36.8 C) 97.7 F (36.5 C)   TempSrc:  Oral Oral   SpO2: 100% 100% 97%   Weight:    (!) 138.7 kg  Height:        Intake/Output Summary (Last 24 hours) at 04/08/2018 0913 Last data filed at 04/08/2018 0109 Gross per 24 hour  Intake 3551.27 ml  Output 1600 ml  Net 1951.27 ml   Filed Weights   04/05/18 1557 04/06/18 0017 04/08/18 0659  Weight: 131.1 kg 131.3 kg (!) 138.7 kg   REVIEW OF SYSTEMS  As per history otherwise all reviewed and reported negative  Exam:  General exam: Morbidly obese female sitting up in the bed she is awake alert talking oriented x3 in no apparent distress. Respiratory system: Bilateral breath sounds fairly clear no increased work of breathing. Cardiovascular system: S1 & S2 heard, RRR.  2++ edema bilateral lower extremities (chronic) Gastrointestinal system: Abdomen is nondistended, soft and nontender. Normal bowel sounds heard. Central nervous system: Alert and oriented. No focal neurological deficits. Extremities: 2++ pitting edema bilateral lower extremities (chronic).  Skin: small area of skin breakdown under abdominal pannus with surrounding yeast infection.   Data Reviewed: Basic Metabolic  Panel: Recent Labs  Lab 04/05/18 1629 04/05/18 2026 04/06/18 0551  NA 138  --  138  K 4.5  --  5.0  CL 105  --  108  CO2 26  --  22  GLUCOSE 130*  --  132*  BUN 22  --  23  CREATININE 1.80*  --  1.63*  CALCIUM 9.1  --  8.6*  MG  --  2.2  --    Liver Function Tests: Recent Labs  Lab 04/05/18 1629  AST 16    ALT 11  ALKPHOS 84  BILITOT 0.9  PROT 7.1  ALBUMIN 3.9   Recent Labs  Lab 04/05/18 1629  LIPASE 21   No results for input(s): AMMONIA in the last 168 hours. CBC: Recent Labs  Lab 04/05/18 1629 04/06/18 0551  WBC 7.5 6.2  NEUTROABS 5.4 3.4  HGB 11.0* 10.0*  HCT 37.7 33.8*  MCV 87.3 86.4  PLT 298 268   Cardiac Enzymes: Recent Labs  Lab 04/05/18 1728 04/05/18 2026 04/06/18 1528 04/06/18 2111 04/07/18 0256  TROPONINI <0.03 <0.03 <0.03 <0.03 <0.03   CBG (last 3)  Recent Labs    04/07/18 1646 04/07/18 2132 04/08/18 0733  GLUCAP 150* 220* 191*   Recent Results (from the past 240 hour(s))  Urine culture     Status: None   Collection Time: 04/05/18  5:29 PM  Result Value Ref Range Status   Specimen Description   Final    URINE, RANDOM Performed at [facility], [address], [place], [place] [address]    Special Requests   Final    NONE Performed at [facility], [address] [place], [place] [address]    Culture   Final    NO GROWTH Performed at [facility], [place] [address], [place], [place] [address]    Report Status 04/07/2018 FINAL  Final  MRSA PCR Screening     Status: Abnormal   Collection Time: 04/06/18  9:46 AM  Result Value Ref Range Status   MRSA by PCR POSITIVE (A) NEGATIVE Final    Comment:        The GeneXpert MRSA Assay (FDA approved for NASAL specimens only), is one component of a comprehensive MRSA colonization surveillance program. It is not intended to diagnose MRSA infection nor to guide or monitor treatment for MRSA infections. RESULT CALLED TO, READ BACK BY AND VERIFIED WITH: COX,D. AT 1405 ON 04/06/2018 BY Elza Rafter. Performed at [facility], [address] [place], [place] [address]      Studies: Dg Chest Port 1 View  Result Date: 04/06/2018 CLINICAL DATA:  Chest pain EXAM: PORTABLE CHEST 1 VIEW COMPARISON:  04/05/2018, 01/06/2018, 01/30/2017 FINDINGS: Rotated patient. No acute opacity or pleural effusion.  Stable cardiomediastinal silhouette. No pneumothorax. Similar appearance rightward shift of mediastinal contents. IMPRESSION: No active disease.  Mild cardiomegaly. Electronically Signed   By: Donavan Foil M.D.   On: 04/06/2018 15:48   Scheduled Meds: . aspirin EC  81 mg Oral Daily  . Chlorhexidine Gluconate Cloth  6 each Topical Q0600  . clopidogrel  75 mg Oral Daily  . docusate sodium  300 mg Oral Daily  . ferrous sulfate  325 mg Oral Q breakfast  . furosemide  20 mg Oral Daily  . gabapentin  200 mg Oral QID  . heparin  5,000 Units Subcutaneous Q8H  . insulin aspart  0-20 Units Subcutaneous TID WC  . insulin detemir  40 Units Subcutaneous QHS  . levothyroxine  175  mcg Oral Q0600  . mupirocin ointment  1 application Nasal BID  . pantoprazole  40 mg Oral BID  . potassium chloride  20 mEq Oral BID  . topiramate  150 mg Oral QPM   Continuous Infusions: . sodium chloride 10 mL/hr at 04/07/18 1904  . cefTRIAXone (ROCEPHIN)  IV 2 g (04/07/18 2151)    Principal Problem:   AKI (acute kidney injury) ([place]) Active Problems:   UTI (urinary tract infection)   COPD (chronic obstructive pulmonary disease) ([place])   DM type 2 (diabetes mellitus, type 2) (HCC)   HTN (hypertension), benign   Chronic diastolic CHF (congestive heart failure) (HCC)   Hypothyroidism   Anemia in CKD (chronic kidney disease)   Anxiety  Time spent:   Irwin Brakeman, MD, FAAFP [facility] Pager [phone]  If 7PM-7AM, please contact night-coverage [email or web address] Password [email or web address] 04/08/2018, 9:13 AM    LOS: 0 days

## 2018-04-09 DIAGNOSIS — N179 Acute kidney failure, unspecified: Secondary | ICD-10-CM | POA: Diagnosis not present

## 2018-04-09 DIAGNOSIS — I5032 Chronic diastolic (congestive) heart failure: Secondary | ICD-10-CM | POA: Diagnosis not present

## 2018-04-09 DIAGNOSIS — D631 Anemia in chronic kidney disease: Secondary | ICD-10-CM

## 2018-04-09 DIAGNOSIS — E1122 Type 2 diabetes mellitus with diabetic chronic kidney disease: Secondary | ICD-10-CM | POA: Diagnosis not present

## 2018-04-09 DIAGNOSIS — Z794 Long term (current) use of insulin: Secondary | ICD-10-CM

## 2018-04-09 DIAGNOSIS — N189 Chronic kidney disease, unspecified: Secondary | ICD-10-CM

## 2018-04-09 DIAGNOSIS — N182 Chronic kidney disease, stage 2 (mild): Secondary | ICD-10-CM

## 2018-04-09 LAB — GLUCOSE, CAPILLARY
Glucose-Capillary: 124 mg/dL — ABNORMAL HIGH (ref 70–99)
Glucose-Capillary: 193 mg/dL — ABNORMAL HIGH (ref 70–99)
Glucose-Capillary: 225 mg/dL — ABNORMAL HIGH (ref 70–99)

## 2018-04-09 MED ORDER — AMMONIUM LACTATE 12 % EX LOTN
TOPICAL_LOTION | Freq: Two times a day (BID) | CUTANEOUS | Status: DC
  Administered 2018-04-09: 13:00:00 via TOPICAL
  Filled 2018-04-09: qty 222

## 2018-04-09 MED ORDER — ALBUTEROL SULFATE HFA 108 (90 BASE) MCG/ACT IN AERS: 2.0000 | INHALATION_SPRAY | RESPIRATORY_TRACT | Status: DC | PRN

## 2018-04-09 MED ORDER — PANTOPRAZOLE SODIUM 40 MG PO TBEC: 40.0000 mg | DELAYED_RELEASE_TABLET | Freq: Two times a day (BID) | ORAL | Status: DC

## 2018-04-09 MED ORDER — INSULIN ASPART 100 UNIT/ML ~~LOC~~ SOLN: 1.0000 [IU] | Freq: Three times a day (TID) | SUBCUTANEOUS | Status: DC

## 2018-04-09 MED ORDER — GERHARDT'S BUTT CREAM
TOPICAL_CREAM | Freq: Every day | CUTANEOUS | Status: DC
  Filled 2018-04-09: qty 1

## 2018-04-09 MED ORDER — FUROSEMIDE 20 MG PO TABS: 20.0000 mg | ORAL_TABLET | ORAL | Status: DC

## 2018-04-09 NOTE — Discharge Instructions (Signed)
Follow with Primary MD  [name], MD  and other consultants as instructed your Hospitalist MD  Please get a complete blood count and chemistry panel checked by your Primary MD at your next visit, and again as instructed by your Primary MD.  Get Medicines reviewed and adjusted: Please take all your medications with you for your next visit with your Primary MD  Laboratory/radiological data: Please request your Primary MD to go over all hospital tests and procedure/radiological results at the follow up, please ask your Primary MD to get all Hospital records sent to his/her office.  In some cases, they will be blood work, cultures and biopsy results pending at the time of your discharge. Please request that your primary care M.D. follows up on these results.  Also Note the following: If you experience worsening of your admission symptoms, develop shortness of breath, life threatening emergency, suicidal or homicidal thoughts you must seek medical attention immediately by calling 911 or calling your MD immediately  if symptoms less severe.  You must read complete instructions/literature along with all the possible adverse reactions/side effects for all the Medicines you take and that have been prescribed to you. Take any new Medicines after you have completely understood and accpet all the possible adverse reactions/side effects.   Do not drive when taking Pain medications or sleeping medications (Benzodaizepines)  Do not take more than prescribed Pain, Sleep and Anxiety Medications. It is not advisable to combine anxiety,sleep and pain medications without talking with your primary care practitioner  Special Instructions: If you have smoked or chewed Tobacco  in the last 2 yrs please stop smoking, stop any regular Alcohol  and or any Recreational drug use.  Wear Seat belts while driving.  Please note: You were cared for by a hospitalist during your hospital stay. Once you are discharged, your  primary care physician will handle any further medical issues. Please note that NO REFILLS for any discharge medications will be authorized once you are discharged, as it is imperative that you return to your primary care physician (or establish a relationship with a primary care physician if you do not have one) for your post hospital discharge needs so that they can reassess your need for medications and monitor your lab values.

## 2018-04-09 NOTE — Consult Note (Signed)
[facility] Nurse wound consult note I spoke with the patient's night shift nurse, P. J. By phone.  She will ask the day shift RN to measure the wound in the abdominal pannus and put those measurements in the record.  She stated they ordered InterDry yesterday for the abdominal pannus and it is helping.  We discussed if adding a piece of Alginate to the actual wound would help with drying the area up, and she stated that would be a good idea.  Also, the patient states she has used Gerhardt's butt cream on her feet for the extreme dryness and would like to have this.  I understand from my discussion with P.J. That there is not erythema of the feet, an actual wound anywhere on the feet, or any signs of a fungal rash, and therefore I do not feel comfortable ordering Gerhardt's butt cream, but would order LacHydrin lotion to apply to the dry skin. Reason for Consult: Wound in Pannus Wound type: MASD, Intertriginous Dermatitis Measurement: To be placed in the record by the patient's primary RN today. Wound bed: See flowsheet Drainage (amount, consistency, odor), MASD Periwound: See flowsheet Dressing procedure/placement/frequency: Continue the use of InterDry.  I will order LacHydrin for the feet. Monitor the wound area(s) for worsening of condition such as: Signs/symptoms of infection,  Increase in size,  Development of or worsening of odor, Development of pain, or increased pain at the affected locations.  Notify the medical team if any of these develop.  Thank you for the consult.  Discussed plan of care with the patient and bedside nurse.  [facility] nurse will not follow at this time.  Please re-consult the [facility] team if needed.  Val Riles, RN, MSN, CWOCN, CNS-BC, pager [phone]

## 2018-04-09 NOTE — Progress Notes (Signed)
Patient given discharge instructions all questions answered. Patient assisted with dressing and discharge to her home with daughter.

## 2018-04-09 NOTE — Evaluation (Signed)
Physical Therapy Evaluation Patient Details Name: Stephanie Combs MRN: [id number] DOB: [birth year] Today's Date: 04/09/2018   History of Present Illness  Patient is a 67 year old female admitted 04/05/2018 with diagnosis acute kidney injury and UTI. PMH: AKI, UTI, COPD, T2DM, HTN, CHF, anemia, anxiety, chronic back pain, degen. disc disease, recurrent syncopal episodes, hypothyroid.    Clinical Impression  Patient required minimal guard to supervision for all mobility with 1L O2. Patient resistive to participating in PT without supplemental oxygen. Patient agreeable to participating in PT session with 1L O2 via nasal cannula. Patient did not exhibit LOB while using RW, however, without RW, patient reached for objects. Patient reports walking around her small home without RW but reaching from object to object for balance.  Patient would continue to benefit from skilled physical therapy in current environment and next venue to continue return to prior function and increase strength, endurance, balance, coordination, and functional mobility and gait skills. Patient functioning close to her baseline functional level. Patient was receiving HHPT prior to admission.      Follow Up Recommendations Home health PT    Equipment Recommendations  None recommended by PT    Recommendations for Other Services       Precautions / Restrictions Precautions Precautions: Fall Restrictions Weight Bearing Restrictions: No      Mobility  Bed Mobility Overal bed mobility: Modified Independent             General bed mobility comments: increased time, HOB elevated  Transfers Overall transfer level: Needs assistance Equipment used: Rolling walker (2 wheeled) Transfers: Sit to/from [facility] Sit to Stand: Supervision Stand pivot transfers: Supervision          Ambulation/Gait Ambulation/Gait assistance: Supervision;Min guard Gait Distance (Feet): 20 Feet Assistive device:  Rolling walker (2 wheeled) Gait Pattern/deviations: Step-through pattern;Decreased stride length;Decreased step length - left;Decreased step length - right;Trunk flexed Gait velocity: decreased   General Gait Details: without RW reaches for objects, no LOB, limited by fatigue, 1L O2 via   Stairs            Wheelchair Mobility    Modified Rankin (Stroke Patients Only)       Balance Overall balance assessment: Needs assistance Sitting-balance support: Bilateral upper extremity supported;Feet supported Sitting balance-Leahy Scale: Good     Standing balance support: Bilateral upper extremity supported;During functional activity Standing balance-Leahy Scale: Fair Standing balance comment: fair/poor without AD, fair with RW                             Pertinent Vitals/Pain Pain Assessment: 0-10 Pain Score: 6  Pain Location: head and stomach Pain Descriptors / Indicators: Sharp Pain Intervention(s): Limited activity within patient's tolerance;Monitored during session    [place] expects to be discharged to:: Private residence Living Arrangements: Alone Available Help at Discharge: Family;Friend(s);Available PRN/intermittently Type of Home: Mobile home Home Access: Ramped entrance     Home Layout: One level Home Equipment: [place] - 2 wheels;Cane - single point;Shower seat;Bedside commode;Wheelchair - manual      Prior Function Level of Independence: Independent with assistive device(s)         Comments: household gait with RW PRN, uses RW for community distances     Hand Dominance   Dominant Hand: Right    Extremity/Trunk Assessment   Upper Extremity Assessment Upper Extremity Assessment: Generalized weakness    Lower Extremity Assessment Lower Extremity Assessment: Generalized weakness  Cervical / Trunk Assessment Cervical / Trunk Assessment: Kyphotic  Communication   Communication: No difficulties  Cognition  Arousal/Alertness: Awake/alert;Lethargic(dozing off during subjective portion) Behavior During Therapy: WFL for tasks assessed/performed Overall Cognitive Status: Within Functional Limits for tasks assessed                                        General Comments      Exercises     Assessment/Plan    PT Assessment Patient needs continued PT services  PT Problem List Decreased mobility;Decreased strength;Obesity;Decreased activity tolerance;Decreased balance       PT Treatment Interventions DME instruction;Therapeutic activities;Gait training;Therapeutic exercise;Patient/family education;Balance training;Neuromuscular re-education    PT Goals (Current goals can be found in the Care Plan section)  Acute Rehab PT Goals Patient Stated Goal: go home with HHPT continuance. PT Goal Formulation: With patient Time For Goal Achievement: 04/23/18 Potential to Achieve Goals: Good    Frequency Min 3X/week   Barriers to discharge        Co-evaluation               AM-PAC PT "6 Clicks" Mobility  Outcome Measure Help needed turning from your back to your side while in a flat bed without using bedrails?: A Little Help needed moving from lying on your back to sitting on the side of a flat bed without using bedrails?: None Help needed moving to and from a bed to a chair (including a wheelchair)?: A Little Help needed standing up from a chair using your arms (e.g., wheelchair or bedside chair)?: A Little Help needed to walk in hospital room?: A Little Help needed climbing 3-5 steps with a railing? : A Little 6 Click Score: 19    End of Session Equipment Utilized During Treatment: Oxygen Activity Tolerance: Patient tolerated treatment well;Patient limited by fatigue Patient left: in chair;with chair alarm set;with nursing/sitter in room;with call bell/phone within reach Nurse Communication: Mobility status PT Visit Diagnosis: Unsteadiness on feet (R26.81);Other  abnormalities of gait and mobility (R26.89);Muscle weakness (generalized) (M62.81)    Time: 7371-0626 PT Time Calculation (min) (ACUTE ONLY): 32 min   Charges:   PT Evaluation $PT Eval Low Complexity: 1 Low PT Treatments $Therapeutic Activity: 8-22 mins        Floria Raveling. Hartnett-Rands, MS, PT Per [place] [phone] 04/09/2018, 11:22 AM

## 2018-04-09 NOTE — Plan of Care (Signed)
  Problem: Acute Rehab PT Goals(only PT should resolve) Goal: Pt will Roll Supine to Side Outcome: Progressing Flowsheets (Taken 04/09/2018 1126) Pt will Roll Supine to Side: with modified independence Goal: Pt Will Go Supine/Side To Sit Outcome: Progressing Flowsheets (Taken 04/09/2018 1126) Pt will go Supine/Side to Sit: with modified independence Goal: Pt Will Go Sit To Supine/Side Outcome: Progressing Flowsheets (Taken 04/09/2018 1126) Pt will go Sit to Supine/Side: with modified independence Goal: Patient Will Transfer Sit To/From Stand Outcome: Progressing Flowsheets (Taken 04/09/2018 1126) Patient will transfer sit to/from stand: with supervision Goal: Pt Will Perform Standing Balance Or Pre-Gait Outcome: Progressing Flowsheets (Taken 04/09/2018 1126) Pt will perform standing balance or pre-gait : with Supervision Goal: Pt Will Ambulate Outcome: Progressing Flowsheets (Taken 04/09/2018 1126) Pt will Ambulate: 50 feet; with rolling walker; with min guard assist Goal: Pt/caregiver will Perform Home Exercise Program Outcome: Progressing Flowsheets (Taken 04/09/2018 1126) Pt/caregiver will Perform Home Exercise Program: For increased strengthening; For improved balance; With minimal assist  Floria Raveling. Hartnett-Rands, MS, PT Per [place] [phone] 04/09/2018

## 2018-04-09 NOTE — Discharge Summary (Signed)
Physician Discharge Summary  TERI LEGACY OFB:[id number] DOB: 07/18/1950 DOA: 04/05/2018  PCP: [name], MD Cardiology: Dr. [name]  Admit date: 04/05/2018 Discharge date: 04/09/2018  Admitted From: Home  Disposition: Home   Recommendations for Outpatient Follow-up:  1. Follow up with PCP in 1 weeks 2. Please follow up with cardiologist in 2 weeks  Home Health: RN, PT  Discharge Condition: STABLE   CODE STATUS: DNR    Brief Hospitalization Summary: Please see all hospital notes, images, labs for full details of the hospitalization. HPI: Stephanie Combs is a 67 y.o. female with medical history significant of anxiety, history of CAD (s/p DES to LAD and angioplasty to D1 in 05/2017), chronic diastolic CHF, chronic back pain, history of degenerative disc disease, COPD, type 2 diabetes, hypertension, hypothyroidism, chronic pedal edema, morbid obesity, chronic anemia, recurrent syncopal episodes with several admissions this year due to this who is coming to the emergency department due to abdominal pain, nausea and emesis for the past 5 days.She denies fever, but feels chills. She denies  diarrhea, but complains of severe constipation.Her appetite is has been poor.  She also complains of mild dysuria and left flank pain.  She denies gross hematuria, but states that her urinary output has been decreased.Denies productive cough, wheezing or hemoptysis.   She denies chest pain, palpitations, dizziness, diaphoresis, PND, orthopnea, but complains of occasional lower extremity edema. No polyuria, polydipsia, polyphagia or blurred vision.  ED Course: Initial vital signs temperature 98.4 F, pulse 72, respirations 24, blood pressure 128/64 mmHg and O2 sat 97%.  He received Maalox, 2 g of ceftriaxone, famotidine 20 mg IVPB and morphine 2 mg IVPB x2 doses in the emergency department.  Her urinalysis is hazy color, concentrated, with positive bilirubin, ketonuria, proteinuria, small leukocyte  esterase, 20-50 WBCs with many bacteria.  Her white blood cell count was 7.5, hemoglobin 11.0 g/dL and platelets 298.  CMP shows a glucose of 130 and a creatinine of 1.8 mg/dL.   Imaging: Her chest radiograph shows stable mild cardiomegaly, without edema.  There is mild scarring at the right lung base with possible volume loss in the right hemithorax versus rightward rotation.  CT abdomen/pelvis no acute intra-abdominal or intrapelvic pathology.  There are some nonobstructing nephrolithiasis among other chronic findings.  Please see images and full radiology report for further detail.  1. UTI -IV ceftriaxone x 3 days.  Urine culture was negative and ceftriaxone was discontinued.   2. Dehydration with acute kidney injury-treated with gentle IV fluids.   3. COPD- does not appear to be in exacerbation, continue home respiratory medications. 4. Type 2 diabetes mellitus- resume home treatment plan.  Follow CBG. 5. Essential hypertension- no blood pressure medications due to her recent history of recurrent hypotension and syncope.  She had been taken off all of her blood pressure medications by cardiology.  Discontinued Zanaflex as it causes her to have hypotension.  6. Chronic diastolic CHF- stable, resume home lasix. 7. Generalized weakness - PT eval recommending Home health PT.   8. Hypothyroidism- continue levothyroxine. TSH elevated, but suspect noncompliance with taking medication.   Resume home levothyroxine dose and follow up with PCP. Pt refuses to go to endocrinologist.  9. Anemia and CKD- Hg stable at 10. 10. GAD- resume home alprazolam as needed. 11. Chest pain-resolved now. EKG, troponins and telemetry monitoring has been negative, GI cocktail given with improvement.  Pt has had no further chest pain complaints.  Pt advised to follow up with her cardiologist  if she has any recurrence of symptoms or return to ER.  12. Hypotension-resolved now.  Patient had mild brief hypotension after Zanaflex  was given.  I  discontinued that as the side effect of this is hypotension and bradycardia. 13. Constipation - continue laxatives.  14. Skin breakdown - small area under pannus - consult to wound care nurse.  Home health nurse to continue to assist patient with local wound care.     DVT prophylaxis: Subcu heparin Code Status: Full code Family Communication: patient updated at bedside Disposition Plan: Home  Discharge Diagnoses:  Principal Problem:   AKI (acute kidney injury) ([place]) Active Problems:   UTI (urinary tract infection)   COPD (chronic obstructive pulmonary disease) ([place])   DM type 2 (diabetes mellitus, type 2) ([place])   HTN (hypertension), benign   Chronic diastolic CHF (congestive heart failure) ([facility])   Hypothyroidism   Anemia in CKD (chronic kidney disease)   Anxiety  Discharge Instructions: Discharge Instructions    Call MD for:  difficulty breathing, headache or visual disturbances   Complete by:  As directed    Call MD for:  extreme fatigue   Complete by:  As directed    Call MD for:  persistant dizziness or light-headedness   Complete by:  As directed    Call MD for:  persistant nausea and vomiting   Complete by:  As directed    Call MD for:  severe uncontrolled pain   Complete by:  As directed    Diet - low sodium heart healthy   Complete by:  As directed    Increase activity slowly   Complete by:  As directed      Allergies as of 04/09/2018      Reactions   Dilaudid [hydromorphone Hcl] Itching   Midodrine Hcl Swelling   After one dose had anaphylactic reaction, had to call EMS.   Actifed Cold-allergy [chlorpheniramine-phenylephrine]    "I was sick and red and it didn't agree with me at all"   Doxycycline Nausea And Vomiting   Other Cough   Pt states she is allergic to ragweed and that she starts coughing and sneezing like crazy   Penicillins Hives, Itching   Tolerates Rocephin Has patient had a PCN reaction causing immediate rash,  facial/tongue/throat swelling, SOB or lightheadedness with hypotension: Yes Has patient had a PCN reaction causing severe rash involving mucus membranes or skin necrosis: No Has patient had a PCN reaction that required hospitalization Yes Has patient had a PCN reaction occurring within the last 10 years: No If all of the above answers are "NO", then may proceed with Cephalosporin use.   Reglan [metoclopramide] Itching   Valium [diazepam] Itching   Vistaril [hydroxyzine Hcl] Itching      Medication List    STOP taking these medications   tiZANidine 4 MG tablet Commonly known as:  ZANAFLEX     TAKE these medications   albuterol (2.5 MG/3ML) 0.083% nebulizer solution Commonly known as:  PROVENTIL Take 2.5 mg by nebulization 2 (two) times daily as needed for wheezing or shortness of breath.   albuterol 108 (90 Base) MCG/ACT inhaler Commonly known as:  PROVENTIL HFA;VENTOLIN HFA Inhale 2 puffs into the lungs every 4 (four) hours as needed. For shortness of breath   ALPRAZolam 1 MG tablet Commonly known as:  XANAX Take 1 mg by mouth 3 (three) times daily as needed for anxiety or sleep.   aspirin EC 81 MG tablet Take 81 mg by mouth daily.  atorvastatin 80 MG tablet Commonly known as:  LIPITOR Take 1 tablet (80 mg total) by mouth daily at 6 PM. What changed:  when to take this   clopidogrel 75 MG tablet Commonly known as:  PLAVIX Take 1 tablet (75 mg total) by mouth daily.   docusate sodium 100 MG capsule Commonly known as:  COLACE Take 300 mg by mouth daily.   ferrous sulfate 325 (65 FE) MG tablet Take 1 tablet (325 mg total) by mouth daily with breakfast.   FISH OIL PO Take 1 capsule by mouth daily.   furosemide 20 MG tablet Commonly known as:  LASIX Take 1 tablet (20 mg total) by mouth every 3 (three) days.   gabapentin 600 MG tablet Commonly known as:  NEURONTIN Take 600 mg by mouth 4 (four) times daily.   Gerhardt's butt cream Crea Apply 1 application  topically 3 (three) times daily.   insulin aspart 100 UNIT/ML injection Commonly known as:  novoLOG Inject 1-18 Units into the skin 3 (three) times daily with meals. What changed:  how much to take   insulin detemir 100 UNIT/ML injection Commonly known as:  LEVEMIR Inject 0.4 mLs (40 Units total) into the skin at bedtime.   levothyroxine 175 MCG tablet Commonly known as:  SYNTHROID, LEVOTHROID Take 175 mcg by mouth daily.   nitroGLYCERIN 0.4 MG SL tablet Commonly known as:  NITROSTAT Place 0.4 mg under the tongue every 5 (five) minutes as needed for chest pain.   nystatin powder Commonly known as:  MYCOSTATIN/NYSTOP Apply topically 2 (two) times daily.   ondansetron 8 MG disintegrating tablet Commonly known as:  ZOFRAN-ODT Take 8 mg by mouth every 8 (eight) hours as needed for nausea or vomiting.   oxyCODONE-acetaminophen 10-325 MG tablet Commonly known as:  PERCOCET Take 1 tablet by mouth every 6 (six) hours as needed for pain.   OXYGEN Inhale 2 L into the lungs continuous.   pantoprazole 40 MG tablet Commonly known as:  PROTONIX Take 1 tablet (40 mg total) by mouth 2 (two) times daily.   potassium chloride 10 MEQ CR capsule Commonly known as:  MICRO-K Take 20 mEq by mouth 2 (two) times daily. *May take one additional tablet as needed for cramping   SURE COMFORT INS SYR 1CC/28G 28G X 1/2" 1 ML Misc Generic drug:  INS SYRINGE/NEEDLE 1CC/28G USE TO INJECT INSULIN UP TO 4 TIMES DAILY.   topiramate 50 MG tablet Commonly known as:  TOPAMAX Take 150 mg by mouth every evening.   umeclidinium-vilanterol 62.5-25 MCG/INH Aepb Commonly known as:  ANORO ELLIPTA Inhale 1 puff into the lungs daily. What changed:    when to take this  reasons to take this   VICTOZA 18 MG/3ML Sopn Generic drug:  liraglutide Inject 1.2 mg into the skin daily.   Vitamin D3 125 MCG (5000 UT) Caps Take 1 capsule (5,000 Units total) by mouth daily.      Follow-up Information    [name], MD. Schedule an appointment as soon as possible for a visit in 1 week(s).   Specialty:  Internal Medicine Why:  Hospital Follow Up  Contact information: [address] [place] [address] [phone]        Herminio Commons, MD .   Specialty:  Cardiology Contact information: [place] [address] [phone]          Allergies  Allergen Reactions  . Dilaudid [Hydromorphone Hcl] Itching  . Midodrine  Hcl Swelling    After one dose had anaphylactic reaction, had to call EMS.  Marland Kitchen Actifed Cold-Allergy [Chlorpheniramine-Phenylephrine]     "I was sick and red and it didn't agree with me at all"  . Doxycycline Nausea And Vomiting  . Other Cough    Pt states she is allergic to ragweed and that she starts coughing and sneezing like crazy  . Penicillins Hives and Itching    Tolerates Rocephin Has patient had a PCN reaction causing immediate rash, facial/tongue/throat swelling, SOB or lightheadedness with hypotension: Yes Has patient had a PCN reaction causing severe rash involving mucus membranes or skin necrosis: No Has patient had a PCN reaction that required hospitalization Yes Has patient had a PCN reaction occurring within the last 10 years: No If all of the above answers are "NO", then may proceed with Cephalosporin use.   . Reglan [Metoclopramide] Itching  . Valium [Diazepam] Itching  . Vistaril [Hydroxyzine Hcl] Itching   Allergies as of 04/09/2018      Reactions   Dilaudid [hydromorphone Hcl] Itching   Midodrine Hcl Swelling   After one dose had anaphylactic reaction, had to call EMS.   Actifed Cold-allergy [chlorpheniramine-phenylephrine]    "I was sick and red and it didn't agree with me at all"   Doxycycline Nausea And Vomiting   Other Cough   Pt states she is allergic to ragweed and that she starts coughing and sneezing like crazy   Penicillins Hives, Itching   Tolerates Rocephin Has patient had a PCN reaction  causing immediate rash, facial/tongue/throat swelling, SOB or lightheadedness with hypotension: Yes Has patient had a PCN reaction causing severe rash involving mucus membranes or skin necrosis: No Has patient had a PCN reaction that required hospitalization Yes Has patient had a PCN reaction occurring within the last 10 years: No If all of the above answers are "NO", then may proceed with Cephalosporin use.   Reglan [metoclopramide] Itching   Valium [diazepam] Itching   Vistaril [hydroxyzine Hcl] Itching      Medication List    STOP taking these medications   tiZANidine 4 MG tablet Commonly known as:  ZANAFLEX     TAKE these medications   albuterol (2.5 MG/3ML) 0.083% nebulizer solution Commonly known as:  PROVENTIL Take 2.5 mg by nebulization 2 (two) times daily as needed for wheezing or shortness of breath.   albuterol 108 (90 Base) MCG/ACT inhaler Commonly known as:  PROVENTIL HFA;VENTOLIN HFA Inhale 2 puffs into the lungs every 4 (four) hours as needed. For shortness of breath   ALPRAZolam 1 MG tablet Commonly known as:  XANAX Take 1 mg by mouth 3 (three) times daily as needed for anxiety or sleep.   aspirin EC 81 MG tablet Take 81 mg by mouth daily.   atorvastatin 80 MG tablet Commonly known as:  LIPITOR Take 1 tablet (80 mg total) by mouth daily at 6 PM. What changed:  when to take this   clopidogrel 75 MG tablet Commonly known as:  PLAVIX Take 1 tablet (75 mg total) by mouth daily.   docusate sodium 100 MG capsule Commonly known as:  COLACE Take 300 mg by mouth daily.   ferrous sulfate 325 (65 FE) MG tablet Take 1 tablet (325 mg total) by mouth daily with breakfast.   FISH OIL PO Take 1 capsule by mouth daily.   furosemide 20 MG tablet Commonly known as:  LASIX Take 1 tablet (20 mg total) by mouth every 3 (three) days.  gabapentin 600 MG tablet Commonly known as:  NEURONTIN Take 600 mg by mouth 4 (four) times daily.   Gerhardt's butt cream  Crea Apply 1 application topically 3 (three) times daily.   insulin aspart 100 UNIT/ML injection Commonly known as:  novoLOG Inject 1-18 Units into the skin 3 (three) times daily with meals. What changed:  how much to take   insulin detemir 100 UNIT/ML injection Commonly known as:  LEVEMIR Inject 0.4 mLs (40 Units total) into the skin at bedtime.   levothyroxine 175 MCG tablet Commonly known as:  SYNTHROID, LEVOTHROID Take 175 mcg by mouth daily.   nitroGLYCERIN 0.4 MG SL tablet Commonly known as:  NITROSTAT Place 0.4 mg under the tongue every 5 (five) minutes as needed for chest pain.   nystatin powder Commonly known as:  MYCOSTATIN/NYSTOP Apply topically 2 (two) times daily.   ondansetron 8 MG disintegrating tablet Commonly known as:  ZOFRAN-ODT Take 8 mg by mouth every 8 (eight) hours as needed for nausea or vomiting.   oxyCODONE-acetaminophen 10-325 MG tablet Commonly known as:  PERCOCET Take 1 tablet by mouth every 6 (six) hours as needed for pain.   OXYGEN Inhale 2 L into the lungs continuous.   pantoprazole 40 MG tablet Commonly known as:  PROTONIX Take 1 tablet (40 mg total) by mouth 2 (two) times daily.   potassium chloride 10 MEQ CR capsule Commonly known as:  MICRO-K Take 20 mEq by mouth 2 (two) times daily. *May take one additional tablet as needed for cramping   SURE COMFORT INS SYR 1CC/28G 28G X 1/2" 1 ML Misc Generic drug:  INS SYRINGE/NEEDLE 1CC/28G USE TO INJECT INSULIN UP TO 4 TIMES DAILY.   topiramate 50 MG tablet Commonly known as:  TOPAMAX Take 150 mg by mouth every evening.   umeclidinium-vilanterol 62.5-25 MCG/INH Aepb Commonly known as:  ANORO ELLIPTA Inhale 1 puff into the lungs daily. What changed:    when to take this  reasons to take this   VICTOZA 18 MG/3ML Sopn Generic drug:  liraglutide Inject 1.2 mg into the skin daily.   Vitamin D3 125 MCG (5000 UT) Caps Take 1 capsule (5,000 Units total) by mouth daily.        Procedures/Studies: Ct Abdomen Pelvis Wo Contrast  Result Date: 04/05/2018 CLINICAL DATA:  Mid abdominal pain for 5 days.  Nausea and vomiting. EXAM: CT ABDOMEN AND PELVIS WITHOUT CONTRAST TECHNIQUE: Multidetector CT imaging of the abdomen and pelvis was performed following the standard protocol without IV contrast. COMPARISON:  CT of the abdomen and pelvis 04/30/2015 FINDINGS: Lower chest: Lung bases are clear without focal nodule, mass, or airspace disease. Mitral valve annular calcifications are present. Heart size is normal. No significant pleural or pericardial effusion is present. Hepatobiliary: No focal liver abnormality is seen. No gallstones, gallbladder wall thickening, or biliary dilatation. Pancreas: Unremarkable. No pancreatic ductal dilatation or surrounding inflammatory changes. Spleen: Normal in size without focal abnormality. Adrenals/Urinary Tract: Left adrenal myelolipoma is stable. Right adrenal gland is within normal limits. Two punctate nonobstructing stones are present in the right kidney. A single stone at the upper pole of the left kidney measures 4 mm. There is no obstruction of either collecting system. Ureters are within normal limits to the urinary bladder which is mostly collapsed. No mass lesion is present. Stomach/Bowel: A duodenal diverticulum is present. Duodenum is otherwise within normal limits. No inflammatory changes evident. Small bowel is within normal limits. Terminal ileum is normal. The appendix is not discretely visualized  and may be surgically absent. The ascending and transverse colon are within normal limits. Descending colon is normal. Diverticular changes are present the sigmoid colon without inflammation. Vascular/Lymphatic: Atherosclerotic calcifications are present without aneurysm. No significant adenopathy is present. Reproductive: Uterus and bilateral adnexa are unremarkable. Other: Subcutaneous injection granulomata are again noted. No significant  ventral hernia is present. There is no significant free fluid. Musculoskeletal: Advanced degenerative changes are again noted at L4-5. Vertebral body heights are maintained. Advanced facet degenerative changes contribute to significant foraminal stenosis, also greatest at L4-5. Pelvis is within normal limits. Hips are located and normal bilaterally. IMPRESSION: 1. No acute or focal abnormality to explain the patient's pain. 2. Stable chronic findings. 3.  Aortic Atherosclerosis (ICD10-I70.0). 4. Left adrenal myelolipoma. 5. Nonobstructing nephrolithiasis. No evidence for recent obstruction. 6. Multilevel degenerative changes of the lumbar spine. Electronically Signed   By: San Morelle M.D.   On: 04/05/2018 21:35   Dg Chest 2 View  Result Date: 04/05/2018 CLINICAL DATA:  Mid to left chest pain EXAM: CHEST - 2 VIEW COMPARISON:  01/06/2018 FINDINGS: The patient is rotated to the right on today's radiograph, reducing diagnostic sensitivity and specificity. Lower thoracic spondylosis. There could be some volume loss in the right lung causing the rightward shift of cardiac and mediastinal structures, although some of the appearance is rotational. Suspected scarring at the right lung base. Mild cardiomegaly, without edema. IMPRESSION: 1. Stable mild cardiomegaly, without edema. 2. Mild scarring at the right lung base. 3. Possible volume loss in the right hemithorax, versus rightward rotation as a cause for the rightward deviation of cardiac mediastinal structures. Electronically Signed   By: Van Clines M.D.   On: 04/05/2018 20:08   Dg Chest Port 1 View  Result Date: 04/06/2018 CLINICAL DATA:  Chest pain EXAM: PORTABLE CHEST 1 VIEW COMPARISON:  04/05/2018, 01/06/2018, 01/30/2017 FINDINGS: Rotated patient. No acute opacity or pleural effusion. Stable cardiomediastinal silhouette. No pneumothorax. Similar appearance rightward shift of mediastinal contents. IMPRESSION: No active disease.  Mild  cardiomegaly. Electronically Signed   By: Donavan Foil M.D.   On: 04/06/2018 15:48      Subjective: Pt complains of some nausea this morning.  No emesis and tolerated breakfast and lunch.    Discharge Exam: Vitals:   04/09/18 0605 04/09/18 0755  BP: 131/77   Pulse: (!) 59   Resp: 20   Temp: 98 F (36.7 C)   SpO2: 99% 98%   Vitals:   04/08/18 1926 04/08/18 2320 04/09/18 0605 04/09/18 0755  BP:  133/77 131/77   Pulse:  62 (!) 59   Resp:  18 20   Temp:  98.3 F (36.8 C) 98 F (36.7 C)   TempSrc:  Oral Oral   SpO2: 98% 100% 99% 98%  Weight:      Height:       General exam: Morbidly obese female sitting up in the bed she is awake alert talking oriented x3 in no apparent distress. Respiratory system: Bilateral breath sounds fairly clear no increased work of breathing. Cardiovascular system: S1 & S2 heard, RRR.  2++ edema bilateral lower extremities (chronic) Gastrointestinal system: Abdomen is nondistended, soft and nontender. Normal bowel sounds heard. Central nervous system: Alert and oriented. No focal neurological deficits. Extremities: 2++ pitting edema bilateral lower extremities (chronic).  Skin: small area of skin breakdown under abdominal pannus with surrounding yeast infection. No signs of cellulitis or yeast infection seen.   The results of significant diagnostics from this hospitalization (including imaging, microbiology,  ancillary and laboratory) are listed below for reference.     Microbiology: Recent Results (from the past 240 hour(s))  Urine culture     Status: None   Collection Time: 04/05/18  5:29 PM  Result Value Ref Range Status   Specimen Description   Final    URINE, RANDOM Performed at [facility], [address] [place], [place] [address]    Special Requests   Final    NONE Performed at [facility], [address] [place], [place] [address]    Culture   Final    NO GROWTH Performed at [facility], [place] [address] [place], [place]  [address]    Report Status 04/07/2018 FINAL  Final  MRSA PCR Screening     Status: Abnormal   Collection Time: 04/06/18  9:46 AM  Result Value Ref Range Status   MRSA by PCR POSITIVE (A) NEGATIVE Final    Comment:        The GeneXpert MRSA Assay (FDA approved for NASAL specimens only), is one component of a comprehensive MRSA colonization surveillance program. It is not intended to diagnose MRSA infection nor to guide or monitor treatment for MRSA infections. RESULT CALLED TO, READ BACK BY AND VERIFIED WITH: COX,D. AT 1405 ON 04/06/2018 BY BAUGHAM,M. Performed at [facility], [address], [place], [place] [address]      Labs: BNP (last 3 results) Recent Labs    04/27/17 1500 05/01/17 1000 01/06/18 1820  BNP 188.0* 108.0* 324.4*   Basic Metabolic Panel: Recent Labs  Lab 04/05/18 1629 04/05/18 2026 04/06/18 0551  NA 138  --  138  K 4.5  --  5.0  CL 105  --  108  CO2 26  --  22  GLUCOSE 130*  --  132*  BUN 22  --  23  CREATININE 1.80*  --  1.63*  CALCIUM 9.1  --  8.6*  MG  --  2.2  --    Liver Function Tests: Recent Labs  Lab 04/05/18 1629  AST 16  ALT 11  ALKPHOS 84  BILITOT 0.9  PROT 7.1  ALBUMIN 3.9   Recent Labs  Lab 04/05/18 1629  LIPASE 21   No results for input(s): AMMONIA in the last 168 hours. CBC: Recent Labs  Lab 04/05/18 1629 04/06/18 0551  WBC 7.5 6.2  NEUTROABS 5.4 3.4  HGB 11.0* 10.0*  HCT 37.7 33.8*  MCV 87.3 86.4  PLT 298 268   Cardiac Enzymes: Recent Labs  Lab 04/05/18 1728 04/05/18 2026 04/06/18 1528 04/06/18 2111 04/07/18 0256  TROPONINI <0.03 <0.03 <0.03 <0.03 <0.03   BNP: Invalid input(s): POCBNP CBG: Recent Labs  Lab 04/08/18 1109 04/08/18 1626 04/08/18 2314 04/09/18 0733 04/09/18 1156  GLUCAP 210* 244* 191* 124* 193*   D-Dimer No results for input(s): DDIMER in the last 72 hours. Hgb A1c No results for input(s): HGBA1C in the last 72 hours. Lipid Profile No results for input(s): CHOL, HDL,  LDLCALC, TRIG, CHOLHDL, LDLDIRECT in the last 72 hours. Thyroid function studies No results for input(s): TSH, T4TOTAL, T3FREE, THYROIDAB in the last 72 hours.  Invalid input(s): FREET3 Anemia work up No results for input(s): VITAMINB12, FOLATE, FERRITIN, TIBC, IRON, RETICCTPCT in the last 72 hours. Urinalysis    Component Value Date/Time   COLORURINE YELLOW 04/05/2018 1559   APPEARANCEUR HAZY (A) 04/05/2018 1559   LABSPEC >1.030 (H) 04/05/2018 1559   PHURINE 5.5 04/05/2018 1559   GLUCOSEU NEGATIVE 04/05/2018 1559   HGBUR SMALL (  A) 04/05/2018 1559   BILIRUBINUR SMALL (A) 04/05/2018 1559   KETONESUR 15 (A) 04/05/2018 1559   PROTEINUR 100 (A) 04/05/2018 1559   UROBILINOGEN 0.2 12/04/2014 0650   NITRITE NEGATIVE 04/05/2018 1559   LEUKOCYTESUR SMALL (A) 04/05/2018 1559   Sepsis Labs Invalid input(s): PROCALCITONIN,  WBC,  LACTICIDVEN Microbiology Recent Results (from the past 240 hour(s))  Urine culture     Status: None   Collection Time: 04/05/18  5:29 PM  Result Value Ref Range Status   Specimen Description   Final    URINE, RANDOM Performed at [facility], [address], [place], [place] [address]    Special Requests   Final    NONE Performed at [facility], [address], [place], [place] [address]    Culture   Final    NO GROWTH Performed at [facility], [place] [address] [place], [place] [address]    Report Status 04/07/2018 FINAL  Final  MRSA PCR Screening     Status: Abnormal   Collection Time: 04/06/18  9:46 AM  Result Value Ref Range Status   MRSA by PCR POSITIVE (A) NEGATIVE Final    Comment:        The GeneXpert MRSA Assay (FDA approved for NASAL specimens only), is one component of a comprehensive MRSA colonization surveillance program. It is not intended to diagnose MRSA infection nor to guide or monitor treatment for MRSA infections. RESULT CALLED TO, READ BACK BY AND VERIFIED WITH: COX,D. AT 8185 ON 04/06/2018 BY Elza Rafter. Performed at  [facility], [address] [place],  [address]    Time coordinating discharge:   SIGNED:  Irwin Brakeman, MD  [facility] 04/09/2018, 12:51 PM Pager [phone]  If 7PM-7AM, please contact night-coverage [email or web address] Password [email or web address]

## 2018-04-09 NOTE — Care Management (Signed)
Santiago Glad of [employer] notified of patient discharge and will obtain orders via Epic.

## 2018-04-12 ENCOUNTER — Other Ambulatory Visit: Payer: Self-pay | Admitting: *Deleted

## 2018-04-12 NOTE — Patient Outreach (Signed)
Acute home visit, pt came home on Friday after 5 day hospital stay. Dxs: UTI, AKI. This is an initial transition of care evaluation.  S: Today, pt reports she fell and was on the floor this am. She prayers for someone to come, she could not reach her phone. A friend of hers came about 10 minutes later. He called 911. They helped Noemie get up and into her chair.   Pt has given herself a good bath after her episode this am. She has curlers in her hair. She hadn't had any lunch and I prepared a ham sandwhich for her. She continues to have some nausea. She states she still thinks her gall bladder is some of the cause of her nausea.  Patient was recently discharged from hospital and all medications have been reviewed.  Tizanidine was discontinued due to hypotension HOWEVER PT INSISTS SHE IS GOING TO TAKE IT.   Patient has duloxetine but does not take it regulary. She takes it prn only, despite educating on dosing regimen for effective results.  O:  BP (!) 90/50   Pulse 62   Resp 18   SpO2 96%  NFBS: 150       Alert and oriented       Verbose        RRR        Lungs are clear        Bilateral LE edema R>L no fluid seepage  A:  S/P UTI, AKI (hospitalized)      DM - NFBS today      Hypotension      COPD - stable      HF - stable       P:   Provided education regarding safe medication administration.       Will reach out to [facility] to make home visit with me to get their foot in the door.       Assisted pt with several necessary personal and household chores.       I will call her again in one week.         Eulah Pont. Myrtie Neither, MSN, [facility] Gerontological Nurse Practitioner [name] Care Management [phone]

## 2018-04-19 ENCOUNTER — Other Ambulatory Visit: Payer: Self-pay

## 2018-04-19 NOTE — Patient Outreach (Signed)
[facility]) Care Management  04/19/2018  Stephanie Combs Aug 15, 1950 [id number]   Follow up call to [name] regarding status of referral to [facility] for home modifications.  An assessment occurred on 03/30/18.  [name] reported that [facility] requested documentation from her but BSW could not get a clear understanding of what is needed to proceed with referral.  BSW left voicemail message for [name] at [facility] regarding update. BSW will follow up with [name] after getting update from Ms. Amos.     [name], BSW Social Worker [phone]

## 2018-04-21 ENCOUNTER — Other Ambulatory Visit: Payer: Self-pay

## 2018-04-21 NOTE — Patient Outreach (Signed)
[facility]) Care Management  04/21/2018  KINESHA AUTEN [birth year] [id number]   BSW received update from [name] at [facility] regarding request for home modifications.  Per [name], she is currently waiting on requested medical records.  Ms. Sergent is requesting that [facility] assist with repair of her ramp and bathroom modification. There is a soft place in the flooring in the kitchen but the engineer with [facility] will have to make the determination about whether or not they can repair it.   BSW contacted Ms. Hunsucker and provided her with the above update.  [name] requested that BSW seek clarification about whether or not Ms. Ikner is working with another agency for home repairs.  Per Ms. Lecy, [facility] is the only agency that has been to her home regarding modifications.  She mentioned that someone  has requested documentation from her such as copy of her ID and her husbands death certificate but BSW could not get an understanding of who is requesting this documentation.  Ms. Plack gave BSW permission to contact daughter for clarification but BSW had to leave voicemail message.  BSW will follow up with Ms. Juday again within the next four weeks.    [name], BSW Social Worker [phone]

## 2018-04-23 ENCOUNTER — Telehealth: Payer: Self-pay | Admitting: Cardiovascular Disease

## 2018-04-23 MED ORDER — POTASSIUM CHLORIDE ER 10 MEQ PO CPCR: 20.0000 meq | ORAL_CAPSULE | Freq: Two times a day (BID) | ORAL | 3 refills | Status: DC

## 2018-04-23 NOTE — Telephone Encounter (Signed)
Patient states that she would like to speak with nurse regarding chest pains and not being able to get appointment until 1/6. / tg

## 2018-04-23 NOTE — Telephone Encounter (Signed)
Patient has not used NTG in 3 days, pain not similar to when she had stent.Really wanted refill for her potassium, which I filled for her.   Has f/u apt 05/17/18, will go to ED if she has to use more than 3 NTG

## 2018-04-27 ENCOUNTER — Other Ambulatory Visit: Payer: Self-pay | Admitting: *Deleted

## 2018-05-03 ENCOUNTER — Other Ambulatory Visit: Payer: Self-pay | Admitting: *Deleted

## 2018-05-03 NOTE — Patient Outreach (Addendum)
[facility]) Care Management  05/03/2018  Stephanie Combs 08-22-1950 [id number]   Joint home visit with [name], Maurice Small, NP and myself. [name] and I have been talking about the possibility for Palliative Care from [facility]. She recognizes she needs a lot more support than what she has had available. [facility] can provide personal care services, bathing, meal preparation, laundry, housekeeping and supportive medial care. [name] appealed to [name] desire for a good quality of life and she has agree to particpate in the [employer] with emphasis on CHF. She has previously completed a MOST form, Living Will and HCPOA, she is a DNR.  I will see [name] one more time in a month and the close her case.  THN CM Care Plan Problem One     Most Recent Value  Care Plan Problem One  Pt wants minimal medical intervention and does not follow through with medical recommendations for disease management will transiition to palliative care.  Role Documenting the Problem One  Care Management [place] for Problem One  Active  THN Long Term Goal   Pt will transition to palliative care over the next 60 days.  THN Long Term Goal Start Date  03/10/18  THN Long Term Goal Met Date  05/03/18  Interventions for Problem One Long Term Goal  -- [Joint visit with [facility] for transition.]  THN CM Short Term Goal #1   Pt will cooperate with joing visit from [facility] and palliative care initial visit by the end of 30 days.  THN CM Short Term Goal #1 Start Date  03/10/18  [facility] CM Short Term Goal #1 Met Date  05/03/18  Interventions for Short Term Goal #1  All in agreeement with the plan of care.     Eulah Pont. Myrtie Neither, MSN, [facility] Gerontological Nurse Practitioner [facility] Care Management [phone]

## 2018-05-14 DIAGNOSIS — I2510 Atherosclerotic heart disease of native coronary artery without angina pectoris: Secondary | ICD-10-CM | POA: Insufficient documentation

## 2018-05-14 NOTE — Progress Notes (Deleted)
Cardiology Office Note    Date:  05/14/2018   ID:  MARVA HENDRYX, DOB 06/09/1950, MRN [id number]  PCP:  [name], MD  Cardiologist: Kate Sable, MD EPS: None  No chief complaint on file.   History of Present Illness:  Stephanie Combs is a 68 y.o. female with history of CAD S/P DED to LAD and angioplasty to D1 08/6268, chronic diastolic CHF,recurrent syncope-with TSH of 199 felt secondary to bradycardia, hypotension and severe hypothyroidism, HTN, morbid obesity, DM, COPD, CKD.  Hospitalized 03/2018 with UTI, dehydration, some chest pain relieved with GI cocktail.Hospitalized 01/2018 with altered mental status/syncope and orthostatic hypotension felt secondary to volume depletion & polypharmacy.  Last saw Bernerd Pho PA-C 12/22/17  Past Medical History:  Diagnosis Date  . Anxiety   . CAD (coronary artery disease)    a. s/p DES to LAD and angioplasty to D1 in 05/2017  . CHF (congestive heart failure) (HCC)    Diastolic  . Chronic back pain   . Chronic pain   . COPD (chronic obstructive pulmonary disease) ([place])   . Degenerative disk disease   . Diabetes mellitus without complication ([place])   . History of medication noncompliance 11/2017   "the patient frequently self-adjusts medications without notifying her physicians"  . Hypertension   . Hypothyroidism   . On home O2    2.5 L N/C prn  . Pedal edema     Past Surgical History:  Procedure Laterality Date  . CORONARY STENT INTERVENTION N/A 05/22/2017   Procedure: CORONARY STENT INTERVENTION;  Surgeon: Martinique, Peter M, MD;  Location: [facility];  Service: Cardiovascular;  Laterality: N/A;  . HERNIA REPAIR    . RIGHT/LEFT HEART CATH AND CORONARY ANGIOGRAPHY N/A 05/19/2017   Procedure: RIGHT/LEFT HEART CATH AND CORONARY ANGIOGRAPHY;  Surgeon: Leonie Man, MD;  Location: [facility];  Service: Cardiovascular;  Laterality: N/A;  . TUBAL LIGATION      Current Medications: No outpatient medications  have been marked as taking for the 05/17/18 encounter (Appointment) with Imogene Burn, PA-C.     Allergies:   Dilaudid [hydromorphone hcl]; Midodrine hcl; Actifed cold-allergy [chlorpheniramine-phenylephrine]; Doxycycline; Other; Penicillins; Reglan [metoclopramide]; Valium [diazepam]; and Vistaril [hydroxyzine hcl]   Social History   Socioeconomic History  . Marital status: Widowed    Spouse name: Not on file  . Number of children: Not on file  . Years of education: Not on file  . Highest education level: Not on file  Occupational History  . Not on file  Social Needs  . Financial resource strain: Not on file  . Food insecurity:    Worry: Not on file    Inability: Not on file  . Transportation needs:    Medical: Not on file    Non-medical: Not on file  Tobacco Use  . Smoking status: Former Smoker    Packs/day: 0.25    Types: Cigarettes    Last attempt to quit: 11/24/2014    Years since quitting: 3.4  . Smokeless tobacco: Never Used  Substance and Sexual Activity  . Alcohol use: No    Alcohol/week: 0.0 standard drinks  . Drug use: No  . Sexual activity: Not Currently    Birth control/protection: Surgical  Lifestyle  . Physical activity:    Days per week: Not on file    Minutes per session: Not on file  . Stress: Not on file  Relationships  . Social connections:    Talks on phone: Not  on file    Gets together: Not on file    Attends religious service: Not on file    Active member of club or organization: Not on file    Attends meetings of clubs or organizations: Not on file    Relationship status: Not on file  Other Topics Concern  . Not on file  Social History Narrative  . Not on file     Family History:  The patient's ***family history includes Cerebral palsy in her brother; Diabetes in her brother and another family member; Heart attack in her father and another family member; Pneumonia in her brother; Stroke in her mother.   ROS:   Please see the history  of present illness.    ROS All other systems reviewed and are negative.   PHYSICAL EXAM:   VS:  There were no vitals taken for this visit.  Physical Exam  GEN: Well nourished, well developed, in no acute distress  HEENT: normal  Neck: no JVD, carotid bruits, or masses Cardiac:RRR; no murmurs, rubs, or gallops  Respiratory:  clear to auscultation bilaterally, normal work of breathing GI: soft, nontender, nondistended, + BS Ext: without cyanosis, clubbing, or edema, Good distal pulses bilaterally MS: no deformity or atrophy  Skin: warm and dry, no rash Neuro:  Alert and Oriented x 3, Strength and sensation are intact Psych: euthymic mood, full affect  Wt Readings from Last 3 Encounters:  04/08/18 (!) 305 lb 12.5 oz (138.7 kg)  01/12/18 279 lb (126.6 kg)  01/06/18 281 lb (127.5 kg)      Studies/Labs Reviewed:   EKG:  EKG is*** ordered today.  The ekg ordered today demonstrates ***  Recent Labs: 01/06/2018: B Natriuretic Peptide 257.0 04/05/2018: ALT 11; Magnesium 2.2; TSH 11.607 04/06/2018: BUN 23; Creatinine, Ser 1.63; Hemoglobin 10.0; Platelets 268; Potassium 5.0; Sodium 138   Lipid Panel    Component Value Date/Time   CHOL 164 05/19/2017 0622   TRIG 104 05/19/2017 0622   HDL 36 (L) 05/19/2017 0622   CHOLHDL 4.6 05/19/2017 0622   VLDL 21 05/19/2017 0622   LDLCALC 107 (H) 05/19/2017 0622    Additional studies/ records that were reviewed today include:   Event Monitor: 09/2017  Sinus rhythm. Average heart rate 72 bpm. Occasional PACs with isolated atrial runs. No significant arrhythmias.   Echocardiogram: 10/24/2017 Study Conclusions   - Left ventricle: The cavity size was normal. Wall thickness was   increased in a pattern of moderate LVH. Systolic function was   vigorous. The estimated ejection fraction was in the range of 65%   to 70%. Doppler parameters are consistent with abnormal left   ventricular relaxation (grade 1 diastolic dysfunction). Doppler    parameters are consistent with high ventricular filling pressure. - Aortic valve: Mildly calcified annulus. Trileaflet; normal   thickness leaflets. Valve area (VTI): 1.87 cm^2. Valve area   (Vmax): 1.87 cm^2. Valve area (Vmean): 1.9 cm^2. - Mitral valve: Mildly calcified annulus. Mildly thickened leaflets   . There was mild regurgitation. - Left atrium: The atrium was moderately dilated. - Atrial septum: No defect or patent foramen ovale was identified. - Pulmonary arteries: Systolic pressure was moderately increased.   PA peak pressure: 41 mm Hg (S). - Technically adequate study.    Cardiac cath 1/2019Ost LAD lesion is 55% stenosed.  Prox LAD lesion is 95% stenosed with 80% stenosed side branch in Ost 1st Diag.  A drug-eluting stent was successfully placed using a STENT SYNERGY DES 2.5X20.  Post intervention,  there is a 0% residual stenosis.  Post intervention, the side branch was reduced to 20% residual stenosis.   1. Successful PCI with stenting of the LAD with a DES and balloon angioplasty of the diagonal branch    Plan: continue DAPT for at least one year. Anticipate DC when medically stable.        ASSESSMENT:    1. Syncope, unspecified syncope type   2. Coronary artery disease involving native coronary artery of native heart without angina pectoris   3. HTN (hypertension), benign   4. Chronic diastolic CHF (congestive heart failure) ([place])   5. CKD (chronic kidney disease), stage III (HCC)      PLAN:  In order of problems listed above:  CAD S/P DES LAD & PTCA Dx 05/2017  Recurrent syncope felt secondary to hypotension, bradycardia, severe hypothyroidism  Essential HTN  Chronic diastolic CHF- normal LVEF echo 10/2017  CKD stage 3 last Crt 1.63 03/2018    Medication Adjustments/Labs and Tests Ordered: Current medicines are reviewed at length with the patient today.  Concerns regarding medicines are outlined above.  Medication changes, Labs and Tests ordered  today are listed in the Patient Instructions below. There are no Patient Instructions on file for this visit.   Sumner Boast, PA-C  05/14/2018 8:51 AM    [facility] [place], [place], [place]  [address] Phone: [phone]; Fax: [phone]

## 2018-05-17 ENCOUNTER — Ambulatory Visit: Payer: Medicare HMO | Admitting: Physician Assistant

## 2018-05-17 ENCOUNTER — Encounter

## 2018-05-18 ENCOUNTER — Other Ambulatory Visit: Payer: Self-pay | Admitting: *Deleted

## 2018-05-19 NOTE — Patient Outreach (Signed)
[facility]) Care Management   05/19/2018  Stephanie Combs [birth year] [id number]  Stephanie Combs is an 68 y.o. female  Subjective: Stephanie Combs is currently active with [facility]. She had a stay at the [facility] facility to treat her abdominal skin fissure and yeast infection and is now back home. She is receiving an aid 3 times a week and she is happy with the service. She continues to want to stay at home at this time.  Objective:   Review of Systems  Constitutional: Positive for malaise/fatigue.  HENT: Negative.   Eyes: Negative.   Respiratory: Negative.   Cardiovascular: Positive for leg swelling.  Gastrointestinal: Positive for diarrhea and nausea.  Genitourinary: Negative.   Musculoskeletal: Positive for back pain, joint pain and myalgias.  Skin: Negative.   Neurological: Positive for dizziness, sensory change and weakness.  Endo/Heme/Allergies: Negative.   Psychiatric/Behavioral: Positive for depression. The patient is nervous/anxious.    BP 100/60   Pulse 68   Resp 18   SpO2 96%   Physical Exam  Constitutional: She is oriented to person, place, and time. She appears well-developed and well-nourished.  HENT:  Head: Normocephalic.  Cardiovascular: Normal rate, regular rhythm and normal heart sounds.  Respiratory: Effort normal and breath sounds normal.  GI: Soft. Bowel sounds are normal.  Musculoskeletal:     Comments: Severe pain and posture alteration related to DJD/DDD.  Neurological: She is alert and oriented to person, place, and time.  Skin: Skin is warm and dry.  Psychiatric: She has a normal mood and affect.    Encounter Medications:   Outpatient Encounter Medications as of 05/18/2018  Medication Sig Note  . albuterol (PROAIR HFA) 108 (90 Base) MCG/ACT inhaler Inhale 2 puffs into the lungs every 4 (four) hours as needed. For shortness of breath   . albuterol (PROVENTIL) (2.5 MG/3ML) 0.083% nebulizer solution Take 2.5 mg by  nebulization 2 (two) times daily as needed for wheezing or shortness of breath.    . ALPRAZolam (XANAX) 1 MG tablet Take 1 mg by mouth 3 (three) times daily as needed for anxiety or sleep.    Marland Kitchen aspirin EC 81 MG tablet Take 81 mg by mouth daily. 04/05/2018: 324mg  taken today prior to coming in   . atorvastatin (LIPITOR) 80 MG tablet Take 1 tablet (80 mg total) by mouth daily at 6 PM. (Patient taking differently: Take 80 mg by mouth every morning. )   . Cholecalciferol (VITAMIN D3) 5000 units CAPS Take 1 capsule (5,000 Units total) by mouth daily.   . clopidogrel (PLAVIX) 75 MG tablet Take 1 tablet (75 mg total) by mouth daily.   Marland Kitchen docusate sodium (COLACE) 100 MG capsule Take 300 mg by mouth daily.   . ferrous sulfate 325 (65 FE) MG tablet Take 1 tablet (325 mg total) by mouth daily with breakfast.   . furosemide (LASIX) 20 MG tablet Take 1 tablet (20 mg total) by mouth every 3 (three) days.   Marland Kitchen gabapentin (NEURONTIN) 600 MG tablet Take 600 mg by mouth 4 (four) times daily.   . Hydrocortisone (GERHARDT'S BUTT CREAM) CREA Apply 1 application topically 3 (three) times daily. 10/13/2017: Applied to toes  . insulin aspart (NOVOLOG) 100 UNIT/ML injection Inject 1-18 Units into the skin 3 (three) times daily with meals.   . insulin detemir (LEVEMIR) 100 UNIT/ML injection Inject 0.4 mLs (40 Units total) into the skin at bedtime.   Marland Kitchen levothyroxine (SYNTHROID, LEVOTHROID) 175 MCG tablet Take 175 mcg by  mouth daily.    . nitroGLYCERIN (NITROSTAT) 0.4 MG SL tablet Place 0.4 mg under the tongue every 5 (five) minutes as needed for chest pain.  04/05/2018: 6 total doses in the past 24 hours  . nystatin (MYCOSTATIN/NYSTOP) powder Apply topically 2 (two) times daily.   . Omega-3 Fatty Acids (FISH OIL PO) Take 1 capsule by mouth daily.    . ondansetron (ZOFRAN-ODT) 8 MG disintegrating tablet Take 8 mg by mouth every 8 (eight) hours as needed for nausea or vomiting.    Marland Kitchen oxyCODONE-acetaminophen (PERCOCET) 10-325 MG  tablet Take 1 tablet by mouth every 6 (six) hours as needed for pain.   . OXYGEN Inhale 2 L into the lungs continuous.   . pantoprazole (PROTONIX) 40 MG tablet Take 1 tablet (40 mg total) by mouth 2 (two) times daily.   . potassium chloride (MICRO-K) 10 MEQ CR capsule Take 2 capsules (20 mEq total) by mouth 2 (two) times daily. *May take one additional tablet as needed for cramping   . SURE COMFORT INS SYR 1CC/28G 28G X 1/2" 1 ML MISC USE TO INJECT INSULIN UP TO 4 TIMES DAILY.   Marland Kitchen topiramate (TOPAMAX) 50 MG tablet Take 150 mg by mouth every evening.    . umeclidinium-vilanterol (ANORO ELLIPTA) 62.5-25 MCG/INH AEPB Inhale 1 puff into the lungs daily. (Patient taking differently: Inhale 1 puff into the lungs daily as needed (for shortness of breath). )   . VICTOZA 18 MG/3ML SOPN Inject 1.2 mg into the skin daily.     No facility-administered encounter medications on file as of 05/18/2018.     Functional Status:   In your present state of health, do you have any difficulty performing the following activities: 04/06/2018 01/06/2018  Hearing? N N  Vision? N N  Difficulty concentrating or making decisions? N N  Walking or climbing stairs? Y Y  Dressing or bathing? N Y  Doing errands, shopping? N Y  Conservation officer, nature and eating ? - -  Using the Toilet? - -  In the past six months, have you accidently leaked urine? - -  Do you have problems with loss of bowel control? - -  Managing your Medications? - -  Managing your Finances? - -  Housekeeping or managing your Housekeeping? - -  Some recent data might be hidden    Fall/Depression Screening:    Fall Risk  10/26/2017 08/19/2016 06/17/2016  Falls in the past year? Yes No No  Number falls in past yr: 2 or more - -  Injury with Fall? Yes - -  Risk Factor Category  High Fall Risk - -  Risk for fall due to : History of fall(s) - -  Risk for fall due to: Comment Due to orthostasis - -  Follow up Falls evaluation completed;Falls prevention discussed - -    PHQ 2/9 Scores 10/26/2017 08/19/2016 06/17/2016 06/17/2016  PHQ - 2 Score 1 0 2 0  PHQ- 9 Score - - 11 -    Assessment:  Stable and now active with [facility].  Plan: Closing pt case. Pt has been advised and understands this transition. I will stay in touch personally just to support her in an unofficial capacity.  Eulah Pont. Myrtie Neither, MSN, [facility] Gerontological Nurse Practitioner [facility] Care Management [phone]

## 2018-05-21 ENCOUNTER — Other Ambulatory Visit: Payer: Self-pay

## 2018-05-21 NOTE — Patient Outreach (Signed)
[facility]) Care Management  05/21/2018  Stephanie Combs December 21, 1950 [id number]   BSW received communication from [name] regarding status of referral for home modifications.   Ms. [name] is currently gathering the necessary medical records to complete medical eligibility for modifications.  She reported that she has three other referrals being processed ahead of [name] and she will keep me updated on status.  BSW called [name] to inform her of this.  BSW will follow up with [name] and [name] again within the next four weeks.  [name], BSW Social Worker [phone]

## 2018-06-04 ENCOUNTER — Ambulatory Visit: Payer: Medicare HMO | Admitting: Physician Assistant

## 2018-06-11 ENCOUNTER — Other Ambulatory Visit: Payer: Self-pay | Admitting: *Deleted

## 2018-06-11 NOTE — Patient Outreach (Signed)
Telephone call received from Mrs. Salvatierra. She has called me several times this week. She is currently not my active patient but I always emphasized that she could call me:  Jan 28th at 10:52 am leaving me a message and at 3:19 pm. I returned her call at 6:23 pm and we talked for 40 minutes. During this call she voiced dissatisfaction with the [place] program. I advised she could disconitnue the service and I emphasized that I had referred her for Palliative care only. I offered to call [facility] and advise them myself that Mrs. Oddo wanted to discontinue this service, but she did NOT want me to do that today or tomorrow. She asked that I wait until Friday, after, she said she would call me.  January 29th 2020 Mrs. Mcburney called me at 1:47 pm. and 1:50 pm . I called her back at 6:51 pm and 6:52 pm on her 2 different lines and at 6:53 pm she returned my call. We spoke for 27 minutes. She continues to complain and states that she did not want Hospice and that this is causing her extreme emotional distress. I again advised her she can discontniue the service and again I offered to call [facility] and again Mrs. Fairley did NOT want me to call them. She does however want me to call them on Friday after the nurse comes and discontinues her foley catheter. She said she will call me when that task is completed so that I may call [facility] at that time.  Today, June 11, 2018, Mrs. Rossa called me at 8:36 am and left me a message to return her call. I returned her call at 9:13 am which she did not answer and at 9:17 am which she did not answer. I did leave a message that I received her message and I was calling back. She returned my call at 9:21 am and we talked for 17 minutes. She continued to vent about her experience this week and her perception that.Marland KitchenMarland Kitchen"They took away my medications." "They are trying to kill me." I attempted to support the agency's intent to support her but she interrupts and  doesn't hear me often during the conversation. I apologized to the patient that her experience has been unsatisfactory as I was the one who initiated the referral for palliative care which we had discussed multiple times over the last couple of months and that we had tried to do a smooth transition for this care as I was closing her case but she did need further nursing oversight going forward.  I called [facility] at 8:57 am and talked with Olevia Bowens, RN, Team Manager, over Mrs. Nakagawa's care. I advised her of my past role with Mrs. Leung and that I made the referral for my patient. I told her that the service needed to be discontinued because it had not been the smooth transition that we had hoped for and the experience had caused Mrs. Esquivel a lot of distress. She advised that she was familiar with the case and in fact had talked with Mrs. Sulkowski earlier today. She advised that she was in agreement that discontinuing the service was in the best interest for both patient and her team as the stress on both sides had escalated due to Mrs. Moritz's misunderstanding of much information since their involvement. She advised that the nurse would be going today to discontinue her foley catheter and would bring the REVOCATION STATEMENT for the patient to sign. I advised  that he should read the statement and ask her if she has any questions before she signs the document.  June 11, 2018 at 12:25 pm Mrs. Couser called me back. She vented her frustrations over the next 8 minutes while I listened. I asked her to allow me to speak without interruption and I encouraged her to calm down and listen. I told her that the service is discontinued as of today. I asked her to please put this experience behind her and go forward with a positive outlook. I told her I would call her Monday, Feb 2nd and see her on Tuesday Feb 3rd if she was in agreement and we could talk about the resumption of [place]  [facility] services. I again told her I was the one responsible for the referral to palliative care which she had agreed to. She began venting her frustrations and she became very upset, yelling at me and stated to me, "[place]!" She hung up the phone at that point. I called her back at 12:34 pm which she did not answer, but I was able to leave a message. I stated that I could not believe that she said that and that it was very hurtful to me as I have been attending to her for the last 8 months and we had developed a very good relationship over this time. I said all I have wanted is for you to have the best quality of life and that I felt like this (referral to palliative care) was appropripriate for the next step in her care. I advised that she can think about me coming to see her next week over the weekend and I will call her on Monday.  Eulah Pont. Myrtie Neither, MSN, [facility] Gerontological Nurse Practitioner [facility] Care Management [phone]

## 2018-06-14 DIAGNOSIS — E1165 Type 2 diabetes mellitus with hyperglycemia: Secondary | ICD-10-CM | POA: Diagnosis not present

## 2018-06-15 ENCOUNTER — Other Ambulatory Visit: Payer: Self-pay | Admitting: *Deleted

## 2018-06-15 DIAGNOSIS — J449 Chronic obstructive pulmonary disease, unspecified: Secondary | ICD-10-CM | POA: Diagnosis not present

## 2018-06-15 NOTE — Patient Outreach (Signed)
[facility]) Care Management  06/15/2018  Stephanie Combs 11-07-1950 [id number]  Acute home visit as pt has revoked services from [facility] and as her previous care manager I want to discuss next steps for her and to see if she wants to re-engage with [facility] care management or myself for assistance with her chronic disease management (HF and DM).  Stephanie Combs allowed me into her home, she was talking on the phone. I waited for her to finish her call and then I told her again that I am so sorry that the transition to Palliative Care turned into such a bad experience. She began venting her frustrations and her perceptions of the whole experience. I listened. I then told her I wanted to review the whole history of how the palliative care referral came about.  1) I started care with Bonnita Nasuti on 10/19/17. 2) I provided chronic disease management and education for her until 05/18/2018. We established a good patient/nurse relationship during this time. 3) Stephanie Combs had 4 admissions and 3 Emergency room visits in the 6 months prior to my engagement. After start up of care she did have 4 Admissions but no Emergency room visits. 4)On my home visit to Stephanie Combs on 02/23/18, Stephanie Combs recounted her visit with her doctors the day before. She said they had a serious conversation about her health status and her expected chronic disease trajectory towards the end of life.  On this visit, I began contemplating the possibility of referring her for Palliative Care as I just learned that the service was now available in [facility]. 5)On my next visit, 03/09/18, Stephanie Combs and I discussed transition to palliative care as the next appropriate step in her care for symptom management of her chronic disease. She told me on that day that she did not want to go to the hospital any more and she did not want aggressive care. I explained the difference between palliative care and Hospice. 6) Home  visit with Stephanie Combs 04/12/18 post hospitalization I called Dr. Julianne Rice office to discuss the situation with him but was told he was out of the office for an extended time. I advised the triage nurse of who I was and that I was making a referral to palliative care. I advised they would get a call from [facility] for a doctor's order for start up of care. 7)Joint visit with [facility] for palliative care assessment and to ensure a smooth transition from care management to palliative care. Stephanie Combs agreed to the service. 8) Visited pt on 05/19/2018 to see how transition was going. She had been an inpatientt at the Hospice home for several Days and had been back home for a couple of days. She told me she was satisfied at this time with the transition and was getting good care from all the [facility] staff that was attending her. I advised that I was closing her case that day. 9) Stephanie Combs began calling me on January 28th to report her dissatisfaction. I advised she could revoke the service if she desired. We talked again on 06/07/17. We spoke again on 06/11/17 and she continued to vent her frustrations. 10) Called [facility] on 06/11/17 to advise that patient wanted to revoke their services due to multiple misunderstandings regarding the service. I spoke to Olevia Bowens, Therapist, sports, Freight forwarder. We both agreed that it was best for all concerned that if Stephanie Combs wanted to revoke services that would be best. 11)Visit with  Stephanie Combs on 06/15/2018 to discuss next steps for her care. After much discussion. I asked Stephanie Combs if she would sign a consent for re-engagement with [facility], which she stated, I will never sign any forms again. I advised that I could not provide services without her consent as I would need to be able to have access to her medical information and be able to talk with her healthcare team. She again, refused. I advised that I would now leave, but if she wants to resume,  she will have to sign the consent to resume our services.  Stephanie Combs interrupted me many times to object to the statements I made and to disagree that her medical record was wrong for one reason or another according to perception or recollection. She stated she had talked to a lawyer and eveyrbody better look out cause she is going to sue everyone involved in this situation.  I will talk to my clinical manager tomorrow regarding this case.  I will advise Dr. Maudie Mercury and Dr. [name] of patients decisions.   Eulah Pont. Myrtie Neither, MSN, [facility] Gerontological Nurse Practitioner [facility] Care Management [phone]

## 2018-06-17 ENCOUNTER — Other Ambulatory Visit: Payer: Self-pay

## 2018-06-17 NOTE — Patient Outreach (Addendum)
[name]) Care Management  06/17/2018  Stephanie Combs Mar 14, 1951 [id number]   At the request of patient, BSW is closing case and [facility] services are being discharged.  BSW informed [name] from [facility] about discharge from [facility] and requested that she still work with her on home modifications.  Please see notes from NP, Deloria Lair, for more information.  [name], BSW Social Worker [phone]

## 2018-06-18 ENCOUNTER — Ambulatory Visit: Payer: Self-pay

## 2018-06-24 DIAGNOSIS — N179 Acute kidney failure, unspecified: Secondary | ICD-10-CM | POA: Diagnosis not present

## 2018-06-24 DIAGNOSIS — E039 Hypothyroidism, unspecified: Secondary | ICD-10-CM | POA: Diagnosis not present

## 2018-06-24 DIAGNOSIS — E1140 Type 2 diabetes mellitus with diabetic neuropathy, unspecified: Secondary | ICD-10-CM | POA: Diagnosis not present

## 2018-06-24 DIAGNOSIS — R2681 Unsteadiness on feet: Secondary | ICD-10-CM | POA: Diagnosis not present

## 2018-06-24 DIAGNOSIS — M6281 Muscle weakness (generalized): Secondary | ICD-10-CM | POA: Diagnosis not present

## 2018-06-24 DIAGNOSIS — N183 Chronic kidney disease, stage 3 (moderate): Secondary | ICD-10-CM | POA: Diagnosis not present

## 2018-07-05 ENCOUNTER — Observation Stay (HOSPITAL_COMMUNITY)
Admission: EM | Admit: 2018-07-05 | Discharge: 2018-07-08 | Disposition: A | Discharge status: Home / Self Care | Payer: Medicare Other | Attending: Internal Medicine | Admitting: Internal Medicine

## 2018-07-05 ENCOUNTER — Emergency Department (HOSPITAL_COMMUNITY): Payer: Medicare Other

## 2018-07-05 ENCOUNTER — Observation Stay (HOSPITAL_BASED_OUTPATIENT_CLINIC_OR_DEPARTMENT_OTHER): Payer: Medicare Other

## 2018-07-05 ENCOUNTER — Other Ambulatory Visit: Payer: Self-pay

## 2018-07-05 ENCOUNTER — Encounter (HOSPITAL_COMMUNITY): Payer: Self-pay

## 2018-07-05 DIAGNOSIS — E038 Other specified hypothyroidism: Secondary | ICD-10-CM

## 2018-07-05 DIAGNOSIS — D631 Anemia in chronic kidney disease: Secondary | ICD-10-CM | POA: Diagnosis present

## 2018-07-05 DIAGNOSIS — R600 Localized edema: Secondary | ICD-10-CM | POA: Diagnosis present

## 2018-07-05 DIAGNOSIS — E669 Obesity, unspecified: Secondary | ICD-10-CM | POA: Diagnosis not present

## 2018-07-05 DIAGNOSIS — N183 Chronic kidney disease, stage 3 unspecified: Secondary | ICD-10-CM | POA: Diagnosis present

## 2018-07-05 DIAGNOSIS — I10 Essential (primary) hypertension: Secondary | ICD-10-CM | POA: Diagnosis present

## 2018-07-05 DIAGNOSIS — I2510 Atherosclerotic heart disease of native coronary artery without angina pectoris: Secondary | ICD-10-CM | POA: Insufficient documentation

## 2018-07-05 DIAGNOSIS — I130 Hypertensive heart and chronic kidney disease with heart failure and stage 1 through stage 4 chronic kidney disease, or unspecified chronic kidney disease: Secondary | ICD-10-CM | POA: Insufficient documentation

## 2018-07-05 DIAGNOSIS — R079 Chest pain, unspecified: Secondary | ICD-10-CM | POA: Diagnosis not present

## 2018-07-05 DIAGNOSIS — E1122 Type 2 diabetes mellitus with diabetic chronic kidney disease: Secondary | ICD-10-CM | POA: Diagnosis present

## 2018-07-05 DIAGNOSIS — R2243 Localized swelling, mass and lump, lower limb, bilateral: Secondary | ICD-10-CM | POA: Diagnosis not present

## 2018-07-05 DIAGNOSIS — I5032 Chronic diastolic (congestive) heart failure: Secondary | ICD-10-CM | POA: Insufficient documentation

## 2018-07-05 DIAGNOSIS — I361 Nonrheumatic tricuspid (valve) insufficiency: Secondary | ICD-10-CM

## 2018-07-05 DIAGNOSIS — Z7982 Long term (current) use of aspirin: Secondary | ICD-10-CM | POA: Insufficient documentation

## 2018-07-05 DIAGNOSIS — E119 Type 2 diabetes mellitus without complications: Secondary | ICD-10-CM

## 2018-07-05 DIAGNOSIS — Z79899 Other long term (current) drug therapy: Secondary | ICD-10-CM | POA: Insufficient documentation

## 2018-07-05 DIAGNOSIS — Z9981 Dependence on supplemental oxygen: Secondary | ICD-10-CM

## 2018-07-05 DIAGNOSIS — J438 Other emphysema: Secondary | ICD-10-CM

## 2018-07-05 DIAGNOSIS — E039 Hypothyroidism, unspecified: Secondary | ICD-10-CM | POA: Diagnosis not present

## 2018-07-05 DIAGNOSIS — I959 Hypotension, unspecified: Secondary | ICD-10-CM | POA: Diagnosis not present

## 2018-07-05 DIAGNOSIS — J9621 Acute and chronic respiratory failure with hypoxia: Secondary | ICD-10-CM | POA: Insufficient documentation

## 2018-07-05 DIAGNOSIS — E559 Vitamin D deficiency, unspecified: Secondary | ICD-10-CM | POA: Diagnosis present

## 2018-07-05 DIAGNOSIS — I4891 Unspecified atrial fibrillation: Secondary | ICD-10-CM | POA: Diagnosis not present

## 2018-07-05 DIAGNOSIS — I340 Nonrheumatic mitral (valve) insufficiency: Secondary | ICD-10-CM | POA: Diagnosis not present

## 2018-07-05 DIAGNOSIS — J449 Chronic obstructive pulmonary disease, unspecified: Secondary | ICD-10-CM | POA: Diagnosis not present

## 2018-07-05 DIAGNOSIS — E1165 Type 2 diabetes mellitus with hyperglycemia: Secondary | ICD-10-CM

## 2018-07-05 DIAGNOSIS — Z9119 Patient's noncompliance with other medical treatment and regimen: Secondary | ICD-10-CM

## 2018-07-05 DIAGNOSIS — R0789 Other chest pain: Principal | ICD-10-CM | POA: Insufficient documentation

## 2018-07-05 DIAGNOSIS — R072 Precordial pain: Secondary | ICD-10-CM

## 2018-07-05 DIAGNOSIS — IMO0002 Reserved for concepts with insufficient information to code with codable children: Secondary | ICD-10-CM | POA: Diagnosis present

## 2018-07-05 DIAGNOSIS — F1120 Opioid dependence, uncomplicated: Secondary | ICD-10-CM | POA: Diagnosis present

## 2018-07-05 DIAGNOSIS — R609 Edema, unspecified: Secondary | ICD-10-CM | POA: Diagnosis present

## 2018-07-05 DIAGNOSIS — G8929 Other chronic pain: Secondary | ICD-10-CM | POA: Diagnosis not present

## 2018-07-05 DIAGNOSIS — N189 Chronic kidney disease, unspecified: Secondary | ICD-10-CM

## 2018-07-05 DIAGNOSIS — I491 Atrial premature depolarization: Secondary | ICD-10-CM | POA: Diagnosis not present

## 2018-07-05 DIAGNOSIS — Z91199 Patient's noncompliance with other medical treatment and regimen due to unspecified reason: Secondary | ICD-10-CM

## 2018-07-05 DIAGNOSIS — J9611 Chronic respiratory failure with hypoxia: Secondary | ICD-10-CM | POA: Diagnosis present

## 2018-07-05 LAB — TROPONIN I
Troponin I: <0.03 ng/mL (ref <0.03)
Troponin I: <0.03 ng/mL (ref <0.03)
Troponin I: <0.03 ng/mL (ref <0.03)

## 2018-07-05 LAB — CBC
HCT: 31.1 % — ABNORMAL LOW (ref 36.0–46.0)
Hemoglobin: 8.8 g/dL — ABNORMAL LOW (ref 12.0–15.0)
MCH: 24.9 pg — ABNORMAL LOW (ref 26.0–34.0)
MCHC: 28.3 g/dL — ABNORMAL LOW (ref 30.0–36.0)
MCV: 88.1 fL (ref 80.0–100.0)
Platelets: 317 10*3/uL (ref 150–400)
RBC: 3.53 MIL/uL — ABNORMAL LOW (ref 3.87–5.11)
RDW: 15 % (ref 11.5–15.5)
WBC: 7 10*3/uL (ref 4.0–10.5)
nRBC: 0 % (ref 0.0–0.2)

## 2018-07-05 LAB — BASIC METABOLIC PANEL
Anion gap: 7 (ref 5–15)
BUN: 31 mg/dL — ABNORMAL HIGH (ref 8–23)
CO2: 26 mmol/L (ref 22–32)
Calcium: 8.5 mg/dL — ABNORMAL LOW (ref 8.9–10.3)
Chloride: 104 mmol/L (ref 98–111)
Creatinine, Ser: 1.54 mg/dL — ABNORMAL HIGH (ref 0.44–1.00)
GFR calc Af Amer: 40 mL/min — ABNORMAL LOW (ref >60)
GFR calc non Af Amer: 34 mL/min — ABNORMAL LOW (ref >60)
Glucose, Bld: 193 mg/dL — ABNORMAL HIGH (ref 70–99)
Potassium: 4.6 mmol/L (ref 3.5–5.1)
Sodium: 137 mmol/L (ref 135–145)

## 2018-07-05 LAB — HEMOGLOBIN A1C
Hgb A1c MFr Bld: 7.9 % — ABNORMAL HIGH (ref 4.8–5.6)
Mean Plasma Glucose: 180.03 mg/dL

## 2018-07-05 LAB — ECHOCARDIOGRAM COMPLETE
Height: 66 in
Weight: 4304 oz

## 2018-07-05 LAB — MRSA PCR SCREENING: MRSA by PCR: NEGATIVE

## 2018-07-05 LAB — BRAIN NATRIURETIC PEPTIDE: B Natriuretic Peptide: 229 pg/mL — ABNORMAL HIGH (ref 0.0–100.0)

## 2018-07-05 LAB — GLUCOSE, CAPILLARY: Glucose-Capillary: 154 mg/dL — ABNORMAL HIGH (ref 70–99)

## 2018-07-05 MED ORDER — ATORVASTATIN CALCIUM 40 MG PO TABS
80.0000 mg | ORAL_TABLET | Freq: Every day | ORAL | Status: DC
  Administered 2018-07-06 – 2018-07-07 (×2): 80 mg via ORAL
  Filled 2018-07-05 (×2): qty 2

## 2018-07-05 MED ORDER — GABAPENTIN 300 MG PO CAPS
600.0000 mg | ORAL_CAPSULE | Freq: Four times a day (QID) | ORAL | Status: DC
  Administered 2018-07-05: 600 mg via ORAL
  Filled 2018-07-05: qty 2

## 2018-07-05 MED ORDER — UMECLIDINIUM-VILANTEROL 62.5-25 MCG/INH IN AEPB
1.0000 | INHALATION_SPRAY | Freq: Every day | RESPIRATORY_TRACT | Status: DC
  Administered 2018-07-06 – 2018-07-08 (×3): 1 via RESPIRATORY_TRACT
  Filled 2018-07-05: qty 14

## 2018-07-05 MED ORDER — ONDANSETRON HCL 4 MG/2ML IJ SOLN: 4.0000 mg | Freq: Four times a day (QID) | INTRAMUSCULAR | Status: DC | PRN

## 2018-07-05 MED ORDER — FERROUS SULFATE 325 (65 FE) MG PO TABS
325.0000 mg | ORAL_TABLET | Freq: Every day | ORAL | Status: DC
  Administered 2018-07-06 – 2018-07-08 (×3): 325 mg via ORAL
  Filled 2018-07-05 (×5): qty 1

## 2018-07-05 MED ORDER — TOPIRAMATE 25 MG PO TABS
150.0000 mg | ORAL_TABLET | Freq: Every evening | ORAL | Status: DC
  Administered 2018-07-06 – 2018-07-07 (×2): 150 mg via ORAL
  Filled 2018-07-05 (×2): qty 6

## 2018-07-05 MED ORDER — ALUM & MAG HYDROXIDE-SIMETH 200-200-20 MG/5ML PO SUSP
30.0000 mL | Freq: Once | ORAL | Status: AC
  Administered 2018-07-05: 30 mL via ORAL
  Filled 2018-07-05: qty 30

## 2018-07-05 MED ORDER — ZOLPIDEM TARTRATE 5 MG PO TABS
5.0000 mg | ORAL_TABLET | Freq: Every evening | ORAL | Status: DC | PRN
  Administered 2018-07-07: 5 mg via ORAL
  Filled 2018-07-05: qty 1

## 2018-07-05 MED ORDER — POTASSIUM CHLORIDE CRYS ER 20 MEQ PO TBCR
20.0000 meq | EXTENDED_RELEASE_TABLET | Freq: Two times a day (BID) | ORAL | Status: DC
  Administered 2018-07-05 – 2018-07-08 (×6): 20 meq via ORAL
  Filled 2018-07-05 (×6): qty 1

## 2018-07-05 MED ORDER — MORPHINE SULFATE (PF) 2 MG/ML IV SOLN: 2.0000 mg | INTRAVENOUS | Status: DC | PRN

## 2018-07-05 MED ORDER — GABAPENTIN 300 MG PO CAPS
300.0000 mg | ORAL_CAPSULE | Freq: Four times a day (QID) | ORAL | Status: DC
  Administered 2018-07-05 – 2018-07-08 (×11): 300 mg via ORAL
  Filled 2018-07-05 (×11): qty 1

## 2018-07-05 MED ORDER — IPRATROPIUM-ALBUTEROL 0.5-2.5 (3) MG/3ML IN SOLN: 3.0000 mL | RESPIRATORY_TRACT | Status: DC | PRN

## 2018-07-05 MED ORDER — LIDOCAINE VISCOUS HCL 2 % MT SOLN
15.0000 mL | Freq: Once | OROMUCOSAL | Status: AC
  Administered 2018-07-05: 15 mL via ORAL
  Filled 2018-07-05: qty 15

## 2018-07-05 MED ORDER — PANTOPRAZOLE SODIUM 40 MG IV SOLR
40.0000 mg | Freq: Two times a day (BID) | INTRAVENOUS | Status: DC
  Administered 2018-07-05 – 2018-07-06 (×2): 40 mg via INTRAVENOUS
  Filled 2018-07-05 (×2): qty 40

## 2018-07-05 MED ORDER — OXYCODONE HCL 5 MG PO TABS
5.0000 mg | ORAL_TABLET | Freq: Four times a day (QID) | ORAL | Status: DC | PRN
  Administered 2018-07-05 – 2018-07-08 (×9): 5 mg via ORAL
  Filled 2018-07-05 (×9): qty 1

## 2018-07-05 MED ORDER — ENOXAPARIN SODIUM 40 MG/0.4ML ~~LOC~~ SOLN: 40.0000 mg | SUBCUTANEOUS | Status: DC

## 2018-07-05 MED ORDER — INSULIN ASPART 100 UNIT/ML ~~LOC~~ SOLN
0.0000 [IU] | Freq: Every day | SUBCUTANEOUS | Status: DC
  Administered 2018-07-06: 2 [IU] via SUBCUTANEOUS
  Administered 2018-07-08: 5 [IU] via SUBCUTANEOUS

## 2018-07-05 MED ORDER — INSULIN DETEMIR 100 UNIT/ML ~~LOC~~ SOLN
40.0000 [IU] | Freq: Every day | SUBCUTANEOUS | Status: DC
  Administered 2018-07-05 – 2018-07-08 (×3): 40 [IU] via SUBCUTANEOUS
  Filled 2018-07-05 (×4): qty 0.4

## 2018-07-05 MED ORDER — LEVOTHYROXINE SODIUM 50 MCG PO TABS
200.0000 ug | ORAL_TABLET | Freq: Every day | ORAL | Status: DC
  Administered 2018-07-06 – 2018-07-08 (×3): 200 ug via ORAL
  Filled 2018-07-05 (×3): qty 4

## 2018-07-05 MED ORDER — PANTOPRAZOLE SODIUM 40 MG IV SOLR
40.0000 mg | Freq: Once | INTRAVENOUS | Status: AC
  Administered 2018-07-05: 40 mg via INTRAVENOUS
  Filled 2018-07-05: qty 40

## 2018-07-05 MED ORDER — ONDANSETRON HCL 4 MG/2ML IJ SOLN
4.0000 mg | Freq: Once | INTRAMUSCULAR | Status: AC
  Administered 2018-07-05: 4 mg via INTRAVENOUS
  Filled 2018-07-05: qty 2

## 2018-07-05 MED ORDER — FUROSEMIDE 20 MG PO TABS
30.0000 mg | ORAL_TABLET | Freq: Two times a day (BID) | ORAL | Status: DC
  Administered 2018-07-05 – 2018-07-08 (×6): 30 mg via ORAL
  Filled 2018-07-05 (×6): qty 2

## 2018-07-05 MED ORDER — NYSTATIN 100000 UNIT/GM EX POWD
Freq: Two times a day (BID) | CUTANEOUS | Status: DC
  Administered 2018-07-05 – 2018-07-08 (×6): via TOPICAL
  Filled 2018-07-05: qty 15

## 2018-07-05 MED ORDER — ALPRAZOLAM 1 MG PO TABS
1.0000 mg | ORAL_TABLET | Freq: Three times a day (TID) | ORAL | Status: DC | PRN
  Administered 2018-07-05 – 2018-07-08 (×6): 1 mg via ORAL
  Filled 2018-07-05 (×6): qty 1

## 2018-07-05 MED ORDER — ACETAMINOPHEN 325 MG PO TABS: 650.0000 mg | ORAL_TABLET | ORAL | Status: DC | PRN

## 2018-07-05 MED ORDER — NITROGLYCERIN 0.4 MG SL SUBL: 0.4000 mg | SUBLINGUAL_TABLET | SUBLINGUAL | Status: DC | PRN

## 2018-07-05 MED ORDER — PANTOPRAZOLE SODIUM 40 MG IV SOLR: 40.0000 mg | Freq: Two times a day (BID) | INTRAVENOUS | Status: DC

## 2018-07-05 MED ORDER — CLOPIDOGREL BISULFATE 75 MG PO TABS
75.0000 mg | ORAL_TABLET | Freq: Every day | ORAL | Status: DC
  Administered 2018-07-06 – 2018-07-08 (×3): 75 mg via ORAL
  Filled 2018-07-05 (×3): qty 1

## 2018-07-05 MED ORDER — OXYCODONE-ACETAMINOPHEN 5-325 MG PO TABS
1.0000 | ORAL_TABLET | Freq: Four times a day (QID) | ORAL | Status: DC | PRN
  Administered 2018-07-07 – 2018-07-08 (×2): 1 via ORAL
  Filled 2018-07-05 (×2): qty 1

## 2018-07-05 MED ORDER — INSULIN ASPART 100 UNIT/ML ~~LOC~~ SOLN
0.0000 [IU] | Freq: Three times a day (TID) | SUBCUTANEOUS | Status: DC
  Administered 2018-07-05: 5 [IU] via SUBCUTANEOUS
  Administered 2018-07-06: 3 [IU] via SUBCUTANEOUS
  Administered 2018-07-06 – 2018-07-07 (×2): 5 [IU] via SUBCUTANEOUS
  Administered 2018-07-07: 3 [IU] via SUBCUTANEOUS
  Administered 2018-07-07: 2 [IU] via SUBCUTANEOUS
  Administered 2018-07-08: 3 [IU] via SUBCUTANEOUS

## 2018-07-05 MED ORDER — MORPHINE SULFATE (PF) 2 MG/ML IV SOLN: 1.0000 mg | INTRAVENOUS | Status: DC | PRN

## 2018-07-05 MED ORDER — OXYCODONE-ACETAMINOPHEN 10-325 MG PO TABS: 1.0000 | ORAL_TABLET | Freq: Four times a day (QID) | ORAL | Status: DC | PRN

## 2018-07-05 MED ORDER — GERHARDT'S BUTT CREAM
1.0000 "application " | TOPICAL_CREAM | Freq: Three times a day (TID) | CUTANEOUS | Status: DC
  Administered 2018-07-06 – 2018-07-08 (×6): 1 via TOPICAL
  Filled 2018-07-05: qty 1

## 2018-07-05 MED ORDER — DOCUSATE SODIUM 100 MG PO CAPS
300.0000 mg | ORAL_CAPSULE | Freq: Every day | ORAL | Status: DC
  Administered 2018-07-05 – 2018-07-08 (×4): 300 mg via ORAL
  Filled 2018-07-05 (×4): qty 3

## 2018-07-05 MED ORDER — ENOXAPARIN SODIUM 60 MG/0.6ML ~~LOC~~ SOLN
60.0000 mg | SUBCUTANEOUS | Status: DC
  Administered 2018-07-05 – 2018-07-07 (×3): 60 mg via SUBCUTANEOUS
  Filled 2018-07-05 (×3): qty 0.6

## 2018-07-05 MED ORDER — MORPHINE SULFATE (PF) 2 MG/ML IV SOLN
2.0000 mg | Freq: Once | INTRAVENOUS | Status: AC
  Administered 2018-07-05: 2 mg via INTRAVENOUS
  Filled 2018-07-05: qty 1

## 2018-07-05 MED ORDER — ONDANSETRON 4 MG PO TBDP
8.0000 mg | ORAL_TABLET | Freq: Three times a day (TID) | ORAL | Status: DC | PRN
  Administered 2018-07-07: 8 mg via ORAL
  Filled 2018-07-05: qty 2

## 2018-07-05 MED ORDER — ASPIRIN EC 81 MG PO TBEC
81.0000 mg | DELAYED_RELEASE_TABLET | Freq: Every day | ORAL | Status: DC
  Administered 2018-07-06 – 2018-07-08 (×3): 81 mg via ORAL
  Filled 2018-07-05 (×3): qty 1

## 2018-07-05 NOTE — ED Triage Notes (Signed)
Pt reports started having cp this morning around 2am.  Pt says she thought it was acid reflux.  Reports took something for it but started getting worse at 4 am.  Pt says left chest hurts and radiates to left jaw, neck, and shoulder.  EMS reports pt took 4 baby asa and 2 nitro.  Reports did not help her cp.  CBG 190

## 2018-07-05 NOTE — H&P (Signed)
History and Physical  Stephanie Combs XIP:[id number] DOB: Oct 09, 1950 DOA: 07/05/2018  Referring physician: Rogene Houston  PCP: [name], MD   Chief Complaint: Chest Pain   HPI: Stephanie Combs is a 68 y.o. female with known coronary artery disease status post DES to LAD and angioplasty to D1 in 6/73/4193, chronic diastolic congestive heart failure, chronic back pain, COPD, type 2 diabetes mellitus, hypertension, hypothyroidism, chronic lower extremity edema, morbid obesity, chronic anemia recurrent syncopal episodes with multiple recent hospitalizations presents with complaints of sudden onset chest pain that started approximately 6 hours prior to arrival.  The chest pain was described as a sharp pain in the center of the chest that is middle to left-sided but not very well localized and described as a heavy pressure and tightness with no radiations to the arms neck or jaw.  The patient says that the pain has been constant and 10/10 in severity.  The patient says that she had initially thought that it was caused by acid reflux but her medications did not improve the symptoms.  She says that she has had chest pain intermittently yesterday but at around 2 AM it became acutely worse.  She is a former smoker but reports that she has not smoked in the last 3 months.  ED course: The patient reported 10 out of 10 chest pain.  Patient was brought in by EMS.  She had received nitroglycerin and aspirin with no improvement in symptoms.  Her EKG did not show acute ST-T wave changes.  Her troponin was less than 0.03.  Her BNP was elevated at 229.  Her hemoglobin was 8.8.  Her platelet count was 317.  Her white blood cell count was 7.0.  Her pulse ox was 96% on room air although she does report that she is on 2 L/min continuously at home of oxygen. Her chest x-ray shows mild atelectasis and pulmonary vascular congestion.  She was given morphine for pain with some relief.  Review of Systems: All systems reviewed and  apart from history of presenting illness, are negative.  Past Medical History:  Diagnosis Date  . Anxiety   . CAD (coronary artery disease)    a. s/p DES to LAD and angioplasty to D1 in 05/2017  . CHF (congestive heart failure) (HCC)    Diastolic  . Chronic back pain   . Chronic pain   . COPD (chronic obstructive pulmonary disease) ([place])   . Degenerative disk disease   . Diabetes mellitus without complication ([place])   . History of medication noncompliance 11/2017   "the patient frequently self-adjusts medications without notifying her physicians"  . Hypertension   . Hypothyroidism   . On home O2    2.5 L N/C prn  . Pedal edema    Past Surgical History:  Procedure Laterality Date  . CORONARY STENT INTERVENTION N/A 05/22/2017   Procedure: CORONARY STENT INTERVENTION;  Surgeon: Martinique, Peter M, MD;  Location: [facility];  Service: Cardiovascular;  Laterality: N/A;  . HERNIA REPAIR    . RIGHT/LEFT HEART CATH AND CORONARY ANGIOGRAPHY N/A 05/19/2017   Procedure: RIGHT/LEFT HEART CATH AND CORONARY ANGIOGRAPHY;  Surgeon: Leonie Man, MD;  Location: [facility];  Service: Cardiovascular;  Laterality: N/A;  . TUBAL LIGATION     Social History:  reports that she quit smoking about 3 years ago. Her smoking use included cigarettes. She smoked 0.25 packs per day. She has never used smokeless tobacco. She reports that she does not drink  alcohol or use drugs.  Allergies  Allergen Reactions  . Dilaudid [Hydromorphone Hcl] Itching  . Midodrine Hcl Swelling    After one dose had anaphylactic reaction, had to call EMS.  Marland Kitchen Actifed Cold-Allergy [Chlorpheniramine-Phenylephrine]     "I was sick and red and it didn't agree with me at all"  . Doxycycline Nausea And Vomiting  . Other Cough    Pt states she is allergic to ragweed and that she starts coughing and sneezing like crazy  . Penicillins Hives and Itching    Tolerates Rocephin Has patient had a PCN reaction causing immediate  rash, facial/tongue/throat swelling, SOB or lightheadedness with hypotension: Yes Has patient had a PCN reaction causing severe rash involving mucus membranes or skin necrosis: No Has patient had a PCN reaction that required hospitalization Yes Has patient had a PCN reaction occurring within the last 10 years: No If all of the above answers are "NO", then may proceed with Cephalosporin use.   . Reglan [Metoclopramide] Itching  . Valium [Diazepam] Itching  . Vistaril [Hydroxyzine Hcl] Itching    Family History  Problem Relation Age of Onset  . Stroke Mother   . Heart attack Father   . Diabetes Brother   . Cerebral palsy Brother   . Pneumonia Brother   . Diabetes Other   . Heart attack Other     Prior to Admission medications   Medication Sig Start Date End Date Taking? Authorizing Provider  albuterol (PROAIR HFA) 108 (90 Base) MCG/ACT inhaler Inhale 2 puffs into the lungs every 4 (four) hours as needed. For shortness of breath 04/09/18  Yes Keshun Berrett L, MD  albuterol (PROVENTIL) (2.5 MG/3ML) 0.083% nebulizer solution Take 2.5 mg by nebulization 2 (two) times daily as needed for wheezing or shortness of breath.    Yes [provider]  ALPRAZolam Duanne Moron) 1 MG tablet Take 1 mg by mouth 3 (three) times daily as needed for anxiety or sleep.  01/05/17  Yes [provider]  atorvastatin (LIPITOR) 80 MG tablet Take 1 tablet (80 mg total) by mouth daily at 6 PM. Patient taking differently: Take 80 mg by mouth every morning.  05/26/17  Yes [name], MD  Cholecalciferol (VITAMIN D3) 5000 units CAPS Take 1 capsule (5,000 Units total) by mouth daily. 08/19/16  Yes Cassandria Anger, MD  clopidogrel (PLAVIX) 75 MG tablet Take 1 tablet (75 mg total) by mouth daily. 05/26/17  Yes [name], MD  docusate sodium (COLACE) 100 MG capsule Take 300 mg by mouth daily.   Yes [provider]  ferrous sulfate 325 (65 FE) MG tablet Take 1 tablet (325 mg total) by mouth daily  with breakfast. 02/05/17  Yes [name], MD  furosemide (LASIX) 20 MG tablet Take 1 tablet (20 mg total) by mouth every 3 (three) days. Patient taking differently: Take 20 mg by mouth 2 (two) times daily.  04/09/18  Yes Kemper Hochman L, MD  gabapentin (NEURONTIN) 600 MG tablet Take 600 mg by mouth 4 (four) times daily.   Yes [provider]  Hydrocortisone (GERHARDT'S BUTT CREAM) CREA Apply 1 application topically 3 (three) times daily. 05/26/17  Yes [name], MD  insulin aspart (NOVOLOG) 100 UNIT/ML injection Inject 1-18 Units into the skin 3 (three) times daily with meals. 04/09/18  Yes Dyllen Menning L, MD  insulin detemir (LEVEMIR) 100 UNIT/ML injection Inject 0.4 mLs (40 Units total) into the skin at bedtime. 11/26/17  Yes Tat, Shanon Brow, MD  nitroGLYCERIN (NITROSTAT) 0.4 MG  SL tablet Place 0.4 mg under the tongue every 5 (five) minutes as needed for chest pain.  03/26/18  Yes [provider]  nystatin (MYCOSTATIN/NYSTOP) powder Apply topically 2 (two) times daily. 10/10/16  Yes Eber Jones, MD  Omega-3 Fatty Acids (FISH OIL PO) Take 1 capsule by mouth daily.    Yes [provider]  ondansetron (ZOFRAN-ODT) 8 MG disintegrating tablet Take 8 mg by mouth every 8 (eight) hours as needed for nausea or vomiting.  03/15/18  Yes [provider]  oxyCODONE-acetaminophen (PERCOCET) 10-325 MG tablet Take 1 tablet by mouth every 6 (six) hours as needed for pain.   Yes [provider]  OXYGEN Inhale 2 L into the lungs continuous.   Yes [provider]  pantoprazole (PROTONIX) 40 MG tablet Take 1 tablet (40 mg total) by mouth 2 (two) times daily. 04/09/18  Yes Ekam Bonebrake L, MD  potassium chloride (MICRO-K) 10 MEQ CR capsule Take 2 capsules (20 mEq total) by mouth 2 (two) times daily. *May take one additional tablet as needed for cramping 04/23/18  Yes Herminio Commons, MD  SURE COMFORT INS SYR 1CC/28G 28G X 1/2" 1 ML MISC USE TO INJECT  INSULIN UP TO 4 TIMES DAILY. 03/06/17  Yes Cassandria Anger, MD  topiramate (TOPAMAX) 50 MG tablet Take 150 mg by mouth every evening.  03/18/18  Yes [provider]  umeclidinium-vilanterol (ANORO ELLIPTA) 62.5-25 MCG/INH AEPB Inhale 1 puff into the lungs daily. Patient taking differently: Inhale 1 puff into the lungs daily as needed (for shortness of breath).  05/26/17  Yes [name], MD  VICTOZA 18 MG/3ML SOPN Inject 1.2 mg into the skin daily.  01/01/17  Yes [provider]  aspirin EC 81 MG tablet Take 81 mg by mouth daily.    [provider]  levothyroxine (SYNTHROID, LEVOTHROID) 200 MCG tablet Take 1 tablet by mouth daily. 06/24/18   [provider]  OZEMPIC, 0.25 OR 0.5 MG/DOSE, 2 MG/1.5ML SOPN  06/30/18   [provider]  tiZANidine (ZANAFLEX) 4 MG tablet  06/16/18   [provider]   Physical Exam: Vitals:   07/05/18 1245 07/05/18 1300 07/05/18 1356 07/05/18 1401  BP:  (!) 142/76 140/64 (!) 145/68  Pulse: 67 64  (!) 101  Resp: 18 17  18   Temp:      TempSrc:      SpO2: 96% 96%  100%  Weight:      Height:        General exam: Morbidly obese female, lying comfortably supine on the gurney in no obvious distress.  Head, eyes and ENT: Nontraumatic and normocephalic. Pupils equally reacting to light and accommodation. Oral mucosa moist.  Neck: Supple. No JVD, carotid bruit or thyromegaly.  Lymphatics: No lymphadenopathy.  Respiratory system: Clear to auscultation. No increased work of breathing.  Cardiovascular system: S1 and S2 heard, RRR.  2++ pedal edema.   Gastrointestinal system: Abdomen is nondistended, soft and nontender. Normal bowel sounds heard. No organomegaly or masses appreciated.  Central nervous system: Alert and oriented. No focal neurological deficits.  Extremities: Symmetric 5 x 5 power. Peripheral pulses symmetrically felt.   Skin: No rashes or acute findings.  Musculoskeletal system: chronic venous  stasis and 2++ pitting pretibial edema bilateral lower extremities.   Psychiatry: Pleasant and cooperative.  Labs on Admission:  Basic Metabolic Panel: Recent Labs  Lab 07/05/18 1138  NA 137  K 4.6  CL 104  CO2 26  GLUCOSE 193*  BUN 31*  CREATININE 1.54*  CALCIUM 8.5*   Liver Function Tests: No results for input(s): AST, ALT, ALKPHOS, BILITOT, PROT, ALBUMIN in the last 168 hours. No results for input(s): LIPASE, AMYLASE in the last 168 hours. No results for input(s): AMMONIA in the last 168 hours. CBC: Recent Labs  Lab 07/05/18 1138  WBC 7.0  HGB 8.8*  HCT 31.1*  MCV 88.1  PLT 317   Cardiac Enzymes: Recent Labs  Lab 07/05/18 1138  TROPONINI <0.03    BNP (last 3 results) No results for input(s): PROBNP in the last 8760 hours. CBG: No results for input(s): GLUCAP in the last 168 hours.  Radiological Exams on Admission: Dg Chest 1 View  Result Date: 07/05/2018 CLINICAL DATA:  Left-sided chest pain. EXAM: CHEST  1 VIEW COMPARISON:  Chest x-ray dated April 06, 2018. FINDINGS: The patient is rotated to the right. Stable cardiomegaly. New mild pulmonary vascular congestion. Mild bibasilar atelectasis. No focal consolidation, pleural effusion, or pneumothorax. No acute osseous abnormality. IMPRESSION: 1. New mild pulmonary vascular congestion. 2. Mild bibasilar atelectasis. Electronically Signed   By: Titus Dubin M.D.   On: 07/05/2018 12:05   EKG: Independently reviewed. No acute ST-T wave abnormalities.   Assessment/Plan Principal Problem:   Chest pain Active Problems:   COPD (chronic obstructive pulmonary disease) (HCC)   Morbid obesity (HCC)   DM type 2 (diabetes mellitus, type 2) (HCC)   HTN (hypertension), benign   CKD (chronic kidney disease), stage III (HCC)   Chronic diastolic CHF (congestive heart failure) (HCC)   Hypothyroidism   Anemia in CKD (chronic kidney disease)   Edema of lower extremity   Bilateral edema of lower extremity   Peripheral  edema   Uncontrolled type 2 diabetes mellitus with stage 3 chronic kidney disease (HCC)   Personal history of noncompliance with medical treatment, presenting hazards to health   Vitamin D deficiency   Chronic respiratory failure with hypoxia (HCC)   CAD (coronary artery disease)   Chronic pain   On home O2   Opioid dependence ([place])  1. Chest pain -patient has a long history of coronary artery disease and now presenting with severe chest pain, relieved with morphine, no troponin elevation so far, admit to telemetry for further monitoring. Cycle troponin to rule out acute myocardial ischemia.  Follow telemetry.  Check fasting lipid panel.  Repeat EKG in a.m.  Continue IV Protonix as ordered.  Pt requesting that cardiology service see her tomorrow.   2. Stage III CKD-monitor creatinine-appears to be stable from recent admissions. 3. Chronic respiratory failure and COPD-resume home respiratory medications bronchodilators and duo nebs ordered as needed for symptoms.  Continue supplemental oxygen. 4. Chronic pain and opioid dependence-resumed home pain medications and IV morphine ordered for severe breakthrough pain if needed. 5. Chronic constipation secondary to opioid-resume home laxative therapy. 6. Type 2 diabetes mellitus with vascular complications- resume basal insulin from home and provide supplemental sliding scale insulin and monitor CBG.  Check hemoglobin A1c. 7. Chronic lower extremity edema-resume home Lasix therapy.  Monitor electrolytes.  Continue potassium supplement. 8. Hypothyroidism-resume home levothyroxine. 9. Essential hypertension-resume home medications and follow. 10. Anemia and CKD- follow hemoglobin closely.  Continue iron supplements. 11. Chronic diastolic congestive heart failure- resume home Lasix therapy as noted above.   DVT Prophylaxis: lovenox Code Status: Full   Family Communication: patient   Disposition Plan: OBV    Time spent: 47 mins  Abhimanyu Cruces,  MD [facility] How to contact the [facility]  Attending or Consulting provider [place] or covering provider during after hours [place], for this patient?  1. Check the care team in [facility] and look for a) attending/consulting [facility] provider listed and b) the [facility] team listed 2. Log into [email or web address] and use [place]'s universal password to access. If you do not have the password, please contact the hospital operator. 3. Locate the [facility] provider you are looking for under [facility] and page to a number that you can be directly reached. 4. If you still have difficulty reaching the provider, please page the [facility] (Director on Call) for the Hospitalists listed on amion for assistance.

## 2018-07-05 NOTE — Progress Notes (Signed)
*  PRELIMINARY RESULTS* Echocardiogram 2D Echocardiogram has been performed.  Leavy Cella 07/05/2018, 4:12 PM

## 2018-07-05 NOTE — ED Provider Notes (Signed)
[facility] EMERGENCY DEPARTMENT Provider Note   CSN: [id number] Arrival date & time: 07/05/18  1107    History   Chief Complaint Chief Complaint  Patient presents with  . Chest Pain    HPI Stephanie Combs is a 68 y.o. female.  HPI: A 68 year old patient with a history of treated diabetes, hypertension and obesity presents for evaluation of chest pain. Initial onset of pain was more than 6 hours ago. The patient's chest pain is sharp and is not worse with exertion. The patient's chest pain is middle- or left-sided, is not well-localized, is not described as heaviness/pressure/tightness and does radiate to the arms/jaw/neck. The patient does not complain of nausea and denies diaphoresis. The patient has no history of stroke, has no history of peripheral artery disease, has not smoked in the past 90 days, has no relevant family history of coronary artery disease (first degree relative at less than age 47) and has no history of hypercholesterolemia.   Patient brought in by EMS.  Patient states that she had worsening chest pain at 2 in the morning.  Had some on and off starting on Saturday.  Pain is in the across her chest left and right does radiate to left arm left jaw.  Patient states pain is 10 out of 10.  In January 2019 patient had angioplasty and stent placed.  Last saw cardiology in August.  Patient followed by cardiology here in [place].  Patient took aspirin and 2 nitro at home prior to EMS arriving stated no real change.  Besides the known history of coronary artery disease.  Patient has a history of chronic diastolic congestive heart failure hypertension hyperlipidemia diabetes and COPD hypothyroidism and stage III chronic kidney disease.     Past Medical History:  Diagnosis Date  . Anxiety   . CAD (coronary artery disease)    a. s/p DES to LAD and angioplasty to D1 in 05/2017  . CHF (congestive heart failure) (HCC)    Diastolic  . Chronic back pain   . Chronic pain   .  COPD (chronic obstructive pulmonary disease) ([place])   . Degenerative disk disease   . Diabetes mellitus without complication ([place])   . History of medication noncompliance 11/2017   "the patient frequently self-adjusts medications without notifying her physicians"  . Hypertension   . Hypothyroidism   . On home O2    2.5 L N/C prn  . Pedal edema     Patient Active Problem List   Diagnosis Date Noted  . CAD (coronary artery disease) 05/14/2018  . AKI (acute kidney injury) ([place]) 04/05/2018  . Cellulitis 01/10/2018  . Cellulitis of right lower extremity 01/07/2018  . Altered mental status 01/06/2018  . Type 2 diabetes mellitus with hypoglycemia without coma ([place]) 11/24/2017  . Anxiety 11/24/2017  . Chronic respiratory failure with hypoxia ([place]) 11/24/2017  . Syncope 10/24/2017  . Bradycardia   . Syncope due to orthostatic hypotension 09/04/2017  . Obesity, Class III, BMI 40-49.9 (morbid obesity) ([place]) 09/04/2017  . Abnormal nuclear stress test 05/19/2017  . Atypical angina ([place]) 05/19/2017  . Edema 05/15/2017  . Skin ulcer ([place]) 01/30/2017  . Tinea cruris 10/10/2016  . Vitamin D deficiency 08/19/2016  . Personal history of noncompliance with medical treatment, presenting hazards to health 06/17/2016  . CAP (community acquired pneumonia) 07/25/2015  . Pressure ulcer 05/02/2015  . Hyponatremia 05/01/2015  . Acute-on-chronic kidney injury ([place]) 05/01/2015  . Uncontrolled type 2 diabetes mellitus with stage 3 chronic kidney disease (  [place]) 05/01/2015  . Cellulitis of foot, right 03/24/2015  . Peripheral edema 12/04/2014  . Acute renal failure superimposed on stage 3 chronic kidney disease ([place]) 11/24/2014  . Bilateral edema of lower extremity   . Diabetic ulcer of right great toe ([place])   . Foot ulcer due to secondary DM ([place]) 11/07/2014  . Diabetic ulcer of right foot ([place]) 11/07/2014  . Edema of lower extremity 05/27/2013  . CKD (chronic kidney disease), stage III ([place]) 04/14/2013    . Anemia in CKD (chronic kidney disease) 04/14/2013  . Chest pain 04/14/2013  . Dyspnea 04/14/2013  . Chronic diastolic CHF (congestive heart failure) ([place])   . Hypothyroidism   . UTI (urinary tract infection) 08/07/2012  . Hypoglycemia 08/07/2012  . COPD (chronic obstructive pulmonary disease) ([place]) 08/07/2012  . Morbid obesity ([place]) 08/07/2012  . DM type 2 (diabetes mellitus, type 2) ([place]) 08/07/2012  . HTN (hypertension), benign 08/07/2012    Past Surgical History:  Procedure Laterality Date  . CORONARY STENT INTERVENTION N/A 05/22/2017   Procedure: CORONARY STENT INTERVENTION;  Surgeon: Martinique, Peter M, MD;  Location: [facility];  Combs: Cardiovascular;  Laterality: N/A;  . HERNIA REPAIR    . RIGHT/LEFT HEART CATH AND CORONARY ANGIOGRAPHY N/A 05/19/2017   Procedure: RIGHT/LEFT HEART CATH AND CORONARY ANGIOGRAPHY;  Surgeon: Leonie Man, MD;  Location: [facility];  Combs: Cardiovascular;  Laterality: N/A;  . TUBAL LIGATION       OB History   No obstetric history on file.      Home Medications    Prior to Admission medications   Medication Sig Start Date End Date Taking? Authorizing Provider  albuterol (PROAIR HFA) 108 (90 Base) MCG/ACT inhaler Inhale 2 puffs into the lungs every 4 (four) hours as needed. For shortness of breath 04/09/18   Wynetta Emery, Clanford L, MD  albuterol (PROVENTIL) (2.5 MG/3ML) 0.083% nebulizer solution Take 2.5 mg by nebulization 2 (two) times daily as needed for wheezing or shortness of breath.     [provider]  ALPRAZolam Duanne Moron) 1 MG tablet Take 1 mg by mouth 3 (three) times daily as needed for anxiety or sleep.  01/05/17   [provider]  aspirin EC 81 MG tablet Take 81 mg by mouth daily.    [provider]  atorvastatin (LIPITOR) 80 MG tablet Take 1 tablet (80 mg total) by mouth daily at 6 PM. Patient taking differently: Take 80 mg by mouth every morning.  05/26/17   [name], MD  Cholecalciferol  (VITAMIN D3) 5000 units CAPS Take 1 capsule (5,000 Units total) by mouth daily. 08/19/16   Cassandria Anger, MD  clopidogrel (PLAVIX) 75 MG tablet Take 1 tablet (75 mg total) by mouth daily. 05/26/17   [name], MD  docusate sodium (COLACE) 100 MG capsule Take 300 mg by mouth daily.    [provider]  ferrous sulfate 325 (65 FE) MG tablet Take 1 tablet (325 mg total) by mouth daily with breakfast. 02/05/17   [name], MD  furosemide (LASIX) 20 MG tablet Take 1 tablet (20 mg total) by mouth every 3 (three) days. 04/09/18   Johnson, Clanford L, MD  gabapentin (NEURONTIN) 600 MG tablet Take 600 mg by mouth 4 (four) times daily.    [provider]  Hydrocortisone (GERHARDT'S BUTT CREAM) CREA Apply 1 application topically 3 (three) times daily. 05/26/17   [name], MD  insulin aspart (NOVOLOG) 100 UNIT/ML injection Inject 1-18 Units into the skin  3 (three) times daily with meals. 04/09/18   Johnson, Clanford L, MD  insulin detemir (LEVEMIR) 100 UNIT/ML injection Inject 0.4 mLs (40 Units total) into the skin at bedtime. 11/26/17   Orson Eva, MD  levothyroxine (SYNTHROID, LEVOTHROID) 175 MCG tablet Take 175 mcg by mouth daily.  12/15/17   [provider]  nitroGLYCERIN (NITROSTAT) 0.4 MG SL tablet Place 0.4 mg under the tongue every 5 (five) minutes as needed for chest pain.  03/26/18   [provider]  nystatin (MYCOSTATIN/NYSTOP) powder Apply topically 2 (two) times daily. 10/10/16   Eber Jones, MD  Omega-3 Fatty Acids (FISH OIL PO) Take 1 capsule by mouth daily.     [provider]  ondansetron (ZOFRAN-ODT) 8 MG disintegrating tablet Take 8 mg by mouth every 8 (eight) hours as needed for nausea or vomiting.  03/15/18   [provider]  oxyCODONE-acetaminophen (PERCOCET) 10-325 MG tablet Take 1 tablet by mouth every 6 (six) hours as needed for pain.    [provider]  OXYGEN Inhale 2 L into the lungs continuous.    [provider]  pantoprazole (PROTONIX) 40 MG tablet Take 1 tablet (40 mg total) by mouth 2 (two) times daily. 04/09/18   Johnson, Clanford L, MD  potassium chloride (MICRO-K) 10 MEQ CR capsule Take 2 capsules (20 mEq total) by mouth 2 (two) times daily. *May take one additional tablet as needed for cramping 04/23/18   Herminio Commons, MD  SURE COMFORT INS SYR 1CC/28G 28G X 1/2" 1 ML MISC USE TO INJECT INSULIN UP TO 4 TIMES DAILY. 03/06/17   Cassandria Anger, MD  topiramate (TOPAMAX) 50 MG tablet Take 150 mg by mouth every evening.  03/18/18   [provider]  umeclidinium-vilanterol (ANORO ELLIPTA) 62.5-25 MCG/INH AEPB Inhale 1 puff into the lungs daily. Patient taking differently: Inhale 1 puff into the lungs daily as needed (for shortness of breath).  05/26/17   [name], MD  VICTOZA 18 MG/3ML SOPN Inject 1.2 mg into the skin daily.  01/01/17   [provider]    Family History Family History  Problem Relation Age of Onset  . Stroke Mother   . Heart attack Father   . Diabetes Brother   . Cerebral palsy Brother   . Pneumonia Brother   . Diabetes Other   . Heart attack Other     Social History Social History   Tobacco Use  . Smoking status: Former Smoker    Packs/day: 0.25    Types: Cigarettes    Last attempt to quit: 11/24/2014    Years since quitting: 3.6  . Smokeless tobacco: Never Used  Substance Use Topics  . Alcohol use: No    Alcohol/week: 0.0 standard drinks  . Drug use: No     Allergies   Dilaudid [hydromorphone hcl]; Midodrine hcl; Actifed cold-allergy [chlorpheniramine-phenylephrine]; Doxycycline; Other; Penicillins; Reglan [metoclopramide]; Valium [diazepam]; and Vistaril [hydroxyzine hcl]   Review of Systems Review of Systems  Constitutional: Negative for chills and fever.  HENT: Negative for congestion, rhinorrhea and sore throat.   Eyes: Negative for visual disturbance.  Respiratory: Negative for cough and shortness of breath.    Cardiovascular: Positive for chest pain and leg swelling.  Gastrointestinal: Negative for abdominal pain, diarrhea, nausea and vomiting.  Genitourinary: Negative for dysuria.  Musculoskeletal: Negative for back pain and neck pain.  Skin: Negative for rash.  Neurological: Negative for dizziness, light-headedness and headaches.  Hematological: Does not bruise/bleed easily.  Psychiatric/Behavioral:  Negative for confusion.     Physical Exam Updated Vital Signs BP (!) 142/76   Pulse 64   Temp 98 F (36.7 C) (Oral)   Resp 17   Ht 1.676 m (5\' 6" )   Wt 122 kg   SpO2 96%   BMI 43.42 kg/m   Physical Exam Vitals signs and nursing note reviewed.  Constitutional:      General: She is not in acute distress.    Appearance: She is well-developed.  HENT:     Head: Normocephalic and atraumatic.     Mouth/Throat:     Mouth: Mucous membranes are moist.  Eyes:     Extraocular Movements: Extraocular movements intact.     Conjunctiva/sclera: Conjunctivae normal.     Pupils: Pupils are equal, round, and reactive to light.  Neck:     Musculoskeletal: Normal range of motion and neck supple.  Cardiovascular:     Rate and Rhythm: Normal rate and regular rhythm.     Heart sounds: No murmur.  Pulmonary:     Effort: Pulmonary effort is normal. No respiratory distress.     Breath sounds: Normal breath sounds. No wheezing.     Comments: Questionable rales Abdominal:     General: Bowel sounds are normal.     Palpations: Abdomen is soft.     Tenderness: There is no abdominal tenderness.  Musculoskeletal:     Right lower leg: Edema present.     Left lower leg: Edema present.     Comments: Chronic bilateral leg edema.  With some chronic skin changes.  Skin:    General: Skin is warm and dry.  Neurological:     General: No focal deficit present.     Mental Status: She is alert and oriented to person, place, and time.      ED Treatments / Results  Labs (all labs ordered are listed, but  only abnormal results are displayed) Labs Reviewed  BASIC METABOLIC PANEL - Abnormal; Notable for the following components:      Result Value   Glucose, Bld 193 (*)    BUN 31 (*)    Creatinine, Ser 1.54 (*)    Calcium 8.5 (*)    GFR calc non Af Amer 34 (*)    GFR calc Af Amer 40 (*)    All other components within normal limits  CBC - Abnormal; Notable for the following components:   RBC 3.53 (*)    Hemoglobin 8.8 (*)    HCT 31.1 (*)    MCH 24.9 (*)    MCHC 28.3 (*)    All other components within normal limits  BRAIN NATRIURETIC PEPTIDE - Abnormal; Notable for the following components:   B Natriuretic Peptide 229.0 (*)    All other components within normal limits  TROPONIN I    EKG EKG Interpretation  Date/Time:  Monday July 05 2018 11:47:28 EST Ventricular Rate:  69 PR Interval:    QRS Duration: 92 QT Interval:  432 QTC Calculation: 463 R Axis:   98 Text Interpretation:  Sinus rhythm Probable lateral infarct, age indeterminate Confirmed by Fredia Sorrow [phone]) on 07/05/2018 11:50:33 AM   Radiology Dg Chest 1 View  Result Date: 07/05/2018 CLINICAL DATA:  Left-sided chest pain. EXAM: CHEST  1 VIEW COMPARISON:  Chest x-ray dated April 06, 2018. FINDINGS: The patient is rotated to the right. Stable cardiomegaly. New mild pulmonary vascular congestion. Mild bibasilar atelectasis. No focal consolidation, pleural effusion, or pneumothorax. No acute osseous abnormality. IMPRESSION: 1. New  mild pulmonary vascular congestion. 2. Mild bibasilar atelectasis. Electronically Signed   By: Titus Dubin M.D.   On: 07/05/2018 12:05    Procedures Procedures (including critical care time)  Medications Ordered in ED Medications  pantoprazole (PROTONIX) injection 40 mg (has no administration in time range)  morphine 2 MG/ML injection 2 mg (has no administration in time range)  ondansetron (ZOFRAN) injection 4 mg (has no administration in time range)     Initial Impression  / Assessment and Plan / ED Course  I have reviewed the triage vital signs and the nursing notes.  Pertinent labs & imaging results that were available during my care of the patient were reviewed by me and considered in my medical decision making (see chart for details).     HEAR Score: 4  Patient with a long list of allergies.  But is able to take morphine for pain.  Patient stated that aspirin and nitro did not help the chest pain at all still was 10 out of 10 upon arrival.  Will give some morphine.  Initial troponin negative which is reassuring.  EKG without acute changes.  However patient does have a known history of stent and angioplasty in January 2019 followed by cardiology here in [place].  Patient chest x-ray without any acute findings.  With the exception does show some evidence of may be some mild pulmonary edema.  Patient's BNP is elevated at 229 but this is close to her baseline.  Will discuss with hospitalist regarding admission.  Final Clinical Impressions(s) / ED Diagnoses   Final diagnoses:  Precordial pain    ED Discharge Orders    None       Fredia Sorrow, MD 07/05/18 1354

## 2018-07-05 NOTE — ED Notes (Signed)
EKG given to MD [facility]

## 2018-07-05 NOTE — ED Notes (Signed)
Patient transported to X-ray 

## 2018-07-06 DIAGNOSIS — N182 Chronic kidney disease, stage 2 (mild): Secondary | ICD-10-CM

## 2018-07-06 DIAGNOSIS — Z794 Long term (current) use of insulin: Secondary | ICD-10-CM

## 2018-07-06 DIAGNOSIS — G894 Chronic pain syndrome: Secondary | ICD-10-CM

## 2018-07-06 LAB — LIPID PANEL
Cholesterol: 90 mg/dL (ref 0–200)
HDL: 35 mg/dL — ABNORMAL LOW (ref >40)
LDL Cholesterol: 37 mg/dL (ref 0–99)
Total CHOL/HDL Ratio: 2.6 RATIO
Triglycerides: 90 mg/dL (ref <150)
VLDL: 18 mg/dL (ref 0–40)

## 2018-07-06 LAB — COMPREHENSIVE METABOLIC PANEL
ALT: 8 U/L (ref 0–44)
AST: 11 U/L — ABNORMAL LOW (ref 15–41)
Albumin: 3 g/dL — ABNORMAL LOW (ref 3.5–5.0)
Alkaline Phosphatase: 64 U/L (ref 38–126)
Anion gap: 5 (ref 5–15)
BUN: 30 mg/dL — ABNORMAL HIGH (ref 8–23)
CO2: 27 mmol/L (ref 22–32)
Calcium: 8.2 mg/dL — ABNORMAL LOW (ref 8.9–10.3)
Chloride: 108 mmol/L (ref 98–111)
Creatinine, Ser: 1.54 mg/dL — ABNORMAL HIGH (ref 0.44–1.00)
GFR calc Af Amer: 40 mL/min — ABNORMAL LOW (ref >60)
GFR calc non Af Amer: 34 mL/min — ABNORMAL LOW (ref >60)
Glucose, Bld: 129 mg/dL — ABNORMAL HIGH (ref 70–99)
Potassium: 4.3 mmol/L (ref 3.5–5.1)
Sodium: 140 mmol/L (ref 135–145)
Total Bilirubin: 0.4 mg/dL (ref 0.3–1.2)
Total Protein: 5.5 g/dL — ABNORMAL LOW (ref 6.5–8.1)

## 2018-07-06 LAB — GLUCOSE, CAPILLARY
Glucose-Capillary: 136 mg/dL — ABNORMAL HIGH (ref 70–99)
Glucose-Capillary: 112 mg/dL — ABNORMAL HIGH (ref 70–99)
Glucose-Capillary: 155 mg/dL — ABNORMAL HIGH (ref 70–99)
Glucose-Capillary: 202 mg/dL — ABNORMAL HIGH (ref 70–99)
Glucose-Capillary: 228 mg/dL — ABNORMAL HIGH (ref 70–99)

## 2018-07-06 MED ORDER — FUROSEMIDE 10 MG/ML IJ SOLN
40.0000 mg | Freq: Once | INTRAMUSCULAR | Status: AC
  Administered 2018-07-06: 40 mg via INTRAVENOUS
  Filled 2018-07-06: qty 4

## 2018-07-06 MED ORDER — PANTOPRAZOLE SODIUM 40 MG PO TBEC
40.0000 mg | DELAYED_RELEASE_TABLET | Freq: Two times a day (BID) | ORAL | Status: DC
  Administered 2018-07-06 – 2018-07-08 (×4): 40 mg via ORAL
  Filled 2018-07-06 (×4): qty 1

## 2018-07-06 MED ORDER — SORBITOL 70 % SOLN
960.0000 mL | TOPICAL_OIL | Freq: Once | ORAL | Status: AC
  Administered 2018-07-06: 960 mL via RECTAL
  Filled 2018-07-06: qty 473

## 2018-07-06 MED ORDER — TIZANIDINE HCL 4 MG PO TABS
4.0000 mg | ORAL_TABLET | Freq: Three times a day (TID) | ORAL | Status: DC
  Administered 2018-07-06 – 2018-07-08 (×7): 4 mg via ORAL
  Filled 2018-07-06 (×7): qty 1

## 2018-07-06 NOTE — Progress Notes (Signed)
Pt asking for pain medication when nurse is in room and states, "I just wish I could get some sleep." Pt resting comfortably with eyes closed on rounds. Pt also closes eyes and nods off when nurse is in the room. Informed patient that it is too soon for pain medication to be given at this time. No signs noted that patient is in severe enough pain for IV Morphine. Pt up to bedside commode with 1 assist, tolerated well. Will continue to monitor.

## 2018-07-06 NOTE — Care Management Obs Status (Signed)
[facility] NOTIFICATION   Patient Details  Name: Stephanie Combs MRN: [id number] Date of Birth: 1950-10-18   Medicare Observation Status Notification Given:  Yes    Sherald Barge, RN 07/06/2018, 11:13 AM

## 2018-07-06 NOTE — Progress Notes (Signed)
PROGRESS NOTE    Stephanie Combs  TKZ:[id number] DOB: Nov 05, 1950 DOA: 07/05/2018 PCP: [name], MD    Brief Narrative:  68 year old female with a history of coronary artery disease,, chronic diastolic heart failure, COPD, diabetes, chronic lower extremity edema, presents to the hospital with complaints of chest discomfort.  She describes a heaviness in her chest.  Is worse with deep inspiration.  She was admitted for further treatment.   Assessment & Plan:   Principal Problem:   Chest pain Active Problems:   COPD (chronic obstructive pulmonary disease) (HCC)   Morbid obesity (HCC)   DM type 2 (diabetes mellitus, type 2) (HCC)   HTN (hypertension), benign   CKD (chronic kidney disease), stage III (HCC)   Chronic diastolic CHF (congestive heart failure) (HCC)   Hypothyroidism   Anemia in CKD (chronic kidney disease)   Edema of lower extremity   Bilateral edema of lower extremity   Peripheral edema   Uncontrolled type 2 diabetes mellitus with stage 3 chronic kidney disease (HCC)   Personal history of noncompliance with medical treatment, presenting hazards to health   Vitamin D deficiency   Chronic respiratory failure with hypoxia (HCC)   CAD (coronary artery disease)   Chronic pain   On home O2   Opioid dependence ([place])   1. Chest pain.  Atypical.  She is ruled out for ACS with negative cardiac markers.  Suspect this is pleuritic related to underlying viral process with pleurisy.  Continue to treat supportively. 2. Chronic kidney disease stage III.  Creatinine is currently at baseline.  Continue to monitor. 3. Chronic respiratory failure with COPD.  No wheezing at this time.  Lungs are clear bilaterally.  Continue current treatments. 4. Acute on chronic diastolic congestive heart failure.  She does have some significant lower extremity edema.  Will give 1 dose of IV Lasix and continue on home Lasix therapy. 5. Anemia in chronic kidney disease.  Hemoglobin is currently stable.   Continue to follow. 6. Diabetes.  Continued on basal insulin and sliding scale insulin.  Blood sugars are stable.  A1c 7.9 7. Hypothyroidism.  Continue on Synthroid.  Chronic pain.  Continue on home dose of pain medications.   DVT prophylaxis: lovenox Code Status: full code Family Communication: discussed with patient  Disposition Plan: discharge home tomorrow if symptoms are improved   Consultants:     Procedures:     Antimicrobials:       Subjective: She feels generally unwell.  Has multiple complaints.  Has not had a bowel movement in several days.  Continues to have chest pain which is worse with deep inspiration.  Objective: Vitals:   07/05/18 2154 07/06/18 0506 07/06/18 0820 07/06/18 1426  BP: (!) 143/63 (!) 124/52  (!) 168/82  Pulse: 79 68  76  Resp:  20  18  Temp: 98.6 F (37 C) 99.1 F (37.3 C)  98.6 F (37 C)  TempSrc: Oral Oral  Oral  SpO2: 100% 99% 98% 100%  Weight:      Height:        Intake/Output Summary (Last 24 hours) at 07/06/2018 1753 Last data filed at 07/06/2018 1300 Gross per 24 hour  Intake 480 ml  Output -  Net 480 ml   Filed Weights   07/05/18 1110  Weight: 122 kg    Examination:  General exam: Appears calm and comfortable  Respiratory system: Clear to auscultation. Respiratory effort normal. Cardiovascular system: S1 & S2 heard, RRR. No JVD, murmurs, rubs, gallops  or clicks. No pedal edema. Gastrointestinal system: Abdomen is nondistended, soft and nontender. No organomegaly or masses felt. Normal bowel sounds heard. Central nervous system: Alert and oriented. No focal neurological deficits. Extremities: Symmetric 5 x 5 power. Skin: 1+ edema in the lower extremities with venous stasis changes Psychiatry: Judgement and insight appear normal. Mood & affect appropriate.     Data Reviewed: I have personally reviewed following labs and imaging studies  CBC: Recent Labs  Lab 07/05/18 1138  WBC 7.0  HGB 8.8*  HCT 31.1*    MCV 88.1  PLT 268   Basic Metabolic Panel: Recent Labs  Lab 07/05/18 1138 07/06/18 0545  NA 137 140  K 4.6 4.3  CL 104 108  CO2 26 27  GLUCOSE 193* 129*  BUN 31* 30*  CREATININE 1.54* 1.54*  CALCIUM 8.5* 8.2*   GFR: Estimated Creatinine Clearance: 46.6 mL/min (A) (by C-G formula based on SCr of 1.54 mg/dL (H)). Liver Function Tests: Recent Labs  Lab 07/06/18 0545  AST 11*  ALT 8  ALKPHOS 64  BILITOT 0.4  PROT 5.5*  ALBUMIN 3.0*   No results for input(s): LIPASE, AMYLASE in the last 168 hours. No results for input(s): AMMONIA in the last 168 hours. Coagulation Profile: No results for input(s): INR, PROTIME in the last 168 hours. Cardiac Enzymes: Recent Labs  Lab 07/05/18 1138 07/05/18 1648 07/05/18 2252  TROPONINI <0.03 <0.03 <0.03   BNP (last 3 results) No results for input(s): PROBNP in the last 8760 hours. HbA1C: Recent Labs    07/05/18 1138  HGBA1C 7.9*   CBG: Recent Labs  Lab 07/05/18 1614 07/05/18 2153 07/06/18 0734 07/06/18 1110 07/06/18 1607  GLUCAP 136* 154* 112* 155* 202*   Lipid Profile: Recent Labs    07/06/18 0545  CHOL 90  HDL 35*  LDLCALC 37  TRIG 90  CHOLHDL 2.6   Thyroid Function Tests: No results for input(s): TSH, T4TOTAL, FREET4, T3FREE, THYROIDAB in the last 72 hours. Anemia Panel: No results for input(s): VITAMINB12, FOLATE, FERRITIN, TIBC, IRON, RETICCTPCT in the last 72 hours. Sepsis Labs: No results for input(s): PROCALCITON, LATICACIDVEN in the last 168 hours.  Recent Results (from the past 240 hour(s))  MRSA PCR Screening     Status: None   Collection Time: 07/05/18  5:38 PM  Result Value Ref Range Status   MRSA by PCR NEGATIVE NEGATIVE Final    Comment:        The GeneXpert MRSA Assay (FDA approved for NASAL specimens only), is one component of a comprehensive MRSA colonization surveillance program. It is not intended to diagnose MRSA infection nor to guide or monitor treatment for MRSA  infections. Performed at [facility], [address] [place], [place] [address]          Radiology Studies: Dg Chest 1 View  Result Date: 07/05/2018 CLINICAL DATA:  Left-sided chest pain. EXAM: CHEST  1 VIEW COMPARISON:  Chest x-ray dated April 06, 2018. FINDINGS: The patient is rotated to the right. Stable cardiomegaly. New mild pulmonary vascular congestion. Mild bibasilar atelectasis. No focal consolidation, pleural effusion, or pneumothorax. No acute osseous abnormality. IMPRESSION: 1. New mild pulmonary vascular congestion. 2. Mild bibasilar atelectasis. Electronically Signed   By: Titus Dubin M.D.   On: 07/05/2018 12:05        Scheduled Meds: . aspirin EC  81 mg Oral Daily  . atorvastatin  80 mg Oral q1800  . clopidogrel  75 mg Oral Daily  . docusate sodium  300  mg Oral Daily  . enoxaparin (LOVENOX) injection  60 mg Subcutaneous Q24H  . ferrous sulfate  325 mg Oral Q breakfast  . furosemide  30 mg Oral BID  . gabapentin  300 mg Oral QID  . Gerhardt's butt cream  1 application Topical TID  . insulin aspart  0-15 Units Subcutaneous TID WC  . insulin aspart  0-5 Units Subcutaneous QHS  . insulin detemir  40 Units Subcutaneous QHS  . levothyroxine  200 mcg Oral Daily  . nystatin   Topical BID  . pantoprazole  40 mg Oral BID  . potassium chloride  20 mEq Oral BID  . tiZANidine  4 mg Oral TID  . topiramate  150 mg Oral QPM  . umeclidinium-vilanterol  1 puff Inhalation Daily   Continuous Infusions:   LOS: 0 days    Time spent: 43mins    Kathie Dike, MD [facility]   If 7PM-7AM, please contact night-coverage [email or web address]  07/06/2018, 5:53 PM

## 2018-07-07 DIAGNOSIS — R079 Chest pain, unspecified: Secondary | ICD-10-CM

## 2018-07-07 LAB — GLUCOSE, CAPILLARY
Glucose-Capillary: 141 mg/dL — ABNORMAL HIGH (ref 70–99)
Glucose-Capillary: 203 mg/dL — ABNORMAL HIGH (ref 70–99)
Glucose-Capillary: 180 mg/dL — ABNORMAL HIGH (ref 70–99)
Glucose-Capillary: 393 mg/dL — ABNORMAL HIGH (ref 70–99)

## 2018-07-07 LAB — BASIC METABOLIC PANEL
Anion gap: 7 (ref 5–15)
BUN: 32 mg/dL — ABNORMAL HIGH (ref 8–23)
CO2: 28 mmol/L (ref 22–32)
Calcium: 8.6 mg/dL — ABNORMAL LOW (ref 8.9–10.3)
Chloride: 105 mmol/L (ref 98–111)
Creatinine, Ser: 1.56 mg/dL — ABNORMAL HIGH (ref 0.44–1.00)
GFR calc Af Amer: 39 mL/min — ABNORMAL LOW (ref >60)
GFR calc non Af Amer: 34 mL/min — ABNORMAL LOW (ref >60)
Glucose, Bld: 181 mg/dL — ABNORMAL HIGH (ref 70–99)
Potassium: 3.9 mmol/L (ref 3.5–5.1)
Sodium: 140 mmol/L (ref 135–145)

## 2018-07-07 MED ORDER — FUROSEMIDE 10 MG/ML IJ SOLN
40.0000 mg | Freq: Once | INTRAMUSCULAR | Status: AC
  Administered 2018-07-07: 40 mg via INTRAVENOUS
  Filled 2018-07-07: qty 4

## 2018-07-07 NOTE — Progress Notes (Signed)
PROGRESS NOTE    Stephanie Combs  FBP:[id number] DOB: 02/07/1951 DOA: 07/05/2018 PCP: [name], MD    Brief Narrative:  68 year old female with a history of coronary artery disease,, chronic diastolic heart failure, COPD, diabetes, chronic lower extremity edema, presents to the hospital with complaints of chest discomfort.  She describes a heaviness in her chest.  Is worse with deep inspiration.  She was admitted for further treatment.   Assessment & Plan:   Principal Problem:   Chest pain Active Problems:   COPD (chronic obstructive pulmonary disease) (HCC)   Morbid obesity (HCC)   DM type 2 (diabetes mellitus, type 2) (HCC)   HTN (hypertension), benign   CKD (chronic kidney disease), stage III (HCC)   Chronic diastolic CHF (congestive heart failure) (HCC)   Hypothyroidism   Anemia in CKD (chronic kidney disease)   Edema of lower extremity   Bilateral edema of lower extremity   Peripheral edema   Uncontrolled type 2 diabetes mellitus with stage 3 chronic kidney disease (HCC)   Personal history of noncompliance with medical treatment, presenting hazards to health   Vitamin D deficiency   Chronic respiratory failure with hypoxia (HCC)   CAD (coronary artery disease)   Chronic pain   On home O2   Opioid dependence ([place])   1. Chest pain.  Atypical.  She is ruled out for ACS with negative cardiac markers.  Suspect this is pleuritic related to underlying viral process with pleurisy.  Continue to treat supportively. 2. Chronic kidney disease stage III.  Creatinine is currently at baseline.  Continue to monitor. 3. Chronic respiratory failure with COPD.  No wheezing at this time.  Lungs are clear bilaterally.  Continue current treatments. 4. Acute on chronic diastolic congestive heart failure.  She continues to have some significant lower extremity edema.  We will give another dose of IV Lasix and continue on home Lasix therapy. 5. Anemia in chronic kidney disease.  Hemoglobin is  currently stable.  Continue to follow. 6. Diabetes.  Continued on basal insulin and sliding scale insulin.  Blood sugars are stable.  A1c 7.9 7. Hypothyroidism.  Continue on Synthroid.  Chronic pain.  Continue on home dose of pain medications. 8. Constipation.  Resolved with enema.   DVT prophylaxis: lovenox Code Status: full code Family Communication: discussed with patient  Disposition Plan: discharge home tomorrow if symptoms are improved   Consultants:     Procedures:     Antimicrobials:       Subjective: Continues to have chest discomfort with deep inspiration.  Still feels short of breath.  Has significant lower extremity edema.  She had a bowel movement last night with enema and another one this morning.  Objective: Vitals:   07/06/18 2100 07/07/18 0730 07/07/18 0732 07/07/18 0734  BP:    (!) 118/55  Pulse:   68 66  Resp:    18  Temp:    98.2 F (36.8 C)  TempSrc:    Oral  SpO2: 97% 100% 100% 100%  Weight:      Height:        Intake/Output Summary (Last 24 hours) at 07/07/2018 1854 Last data filed at 07/07/2018 1249 Gross per 24 hour  Intake 720 ml  Output -  Net 720 ml   Filed Weights   07/05/18 1110  Weight: 122 kg    Examination:  General exam: Alert, awake, oriented x 3 Respiratory system: Clear to auscultation. Respiratory effort normal. Cardiovascular system:RRR. No murmurs, rubs, gallops. Gastrointestinal  system: Abdomen is nondistended, soft and nontender. No organomegaly or masses felt. Normal bowel sounds heard. Central nervous system: Alert and oriented. No focal neurological deficits. Extremities: 1+ edema lower extremities bilaterally Skin: No rashes, lesions or ulcers Psychiatry: Judgement and insight appear normal.  She appears to be irritable     Data Reviewed: I have personally reviewed following labs and imaging studies  CBC: Recent Labs  Lab 07/05/18 1138  WBC 7.0  HGB 8.8*  HCT 31.1*  MCV 88.1  PLT 956   Basic  Metabolic Panel: Recent Labs  Lab 07/05/18 1138 07/06/18 0545 07/07/18 0531  NA 137 140 140  K 4.6 4.3 3.9  CL 104 108 105  CO2 26 27 28   GLUCOSE 193* 129* 181*  BUN 31* 30* 32*  CREATININE 1.54* 1.54* 1.56*  CALCIUM 8.5* 8.2* 8.6*   GFR: Estimated Creatinine Clearance: 46 mL/min (A) (by C-G formula based on SCr of 1.56 mg/dL (H)). Liver Function Tests: Recent Labs  Lab 07/06/18 0545  AST 11*  ALT 8  ALKPHOS 64  BILITOT 0.4  PROT 5.5*  ALBUMIN 3.0*   No results for input(s): LIPASE, AMYLASE in the last 168 hours. No results for input(s): AMMONIA in the last 168 hours. Coagulation Profile: No results for input(s): INR, PROTIME in the last 168 hours. Cardiac Enzymes: Recent Labs  Lab 07/05/18 1138 07/05/18 1648 07/05/18 2252  TROPONINI <0.03 <0.03 <0.03   BNP (last 3 results) No results for input(s): PROBNP in the last 8760 hours. HbA1C: Recent Labs    07/05/18 1138  HGBA1C 7.9*   CBG: Recent Labs  Lab 07/06/18 1607 07/06/18 2108 07/07/18 0814 07/07/18 1203 07/07/18 1624  GLUCAP 202* 228* 141* 203* 180*   Lipid Profile: Recent Labs    07/06/18 0545  CHOL 90  HDL 35*  LDLCALC 37  TRIG 90  CHOLHDL 2.6   Thyroid Function Tests: No results for input(s): TSH, T4TOTAL, FREET4, T3FREE, THYROIDAB in the last 72 hours. Anemia Panel: No results for input(s): VITAMINB12, FOLATE, FERRITIN, TIBC, IRON, RETICCTPCT in the last 72 hours. Sepsis Labs: No results for input(s): PROCALCITON, LATICACIDVEN in the last 168 hours.  Recent Results (from the past 240 hour(s))  MRSA PCR Screening     Status: None   Collection Time: 07/05/18  5:38 PM  Result Value Ref Range Status   MRSA by PCR NEGATIVE NEGATIVE Final    Comment:        The GeneXpert MRSA Assay (FDA approved for NASAL specimens only), is one component of a comprehensive MRSA colonization surveillance program. It is not intended to diagnose MRSA infection nor to guide or monitor treatment  for MRSA infections. Performed at [facility], [address] [place], [place] [address]          Radiology Studies: No results found.      Scheduled Meds: . aspirin EC  81 mg Oral Daily  . atorvastatin  80 mg Oral q1800  . clopidogrel  75 mg Oral Daily  . docusate sodium  300 mg Oral Daily  . enoxaparin (LOVENOX) injection  60 mg Subcutaneous Q24H  . ferrous sulfate  325 mg Oral Q breakfast  . furosemide  30 mg Oral BID  . gabapentin  300 mg Oral QID  . Gerhardt's butt cream  1 application Topical TID  . insulin aspart  0-15 Units Subcutaneous TID WC  . insulin aspart  0-5 Units Subcutaneous QHS  . insulin detemir  40 Units Subcutaneous QHS  . levothyroxine  200 mcg Oral Daily  . nystatin   Topical BID  . pantoprazole  40 mg Oral BID  . potassium chloride  20 mEq Oral BID  . tiZANidine  4 mg Oral TID  . topiramate  150 mg Oral QPM  . umeclidinium-vilanterol  1 puff Inhalation Daily   Continuous Infusions:   LOS: 0 days    Time spent: 52mins    Kathie Dike, MD [facility]   If 7PM-7AM, please contact night-coverage [email or web address]  07/07/2018, 6:54 PM

## 2018-07-07 NOTE — Progress Notes (Signed)
Ambulated to BR and back to bed w/ one assist. Dyspnea noted. No c/o pain, dizziness or nausea. wiill continue to monitor.

## 2018-07-07 NOTE — Progress Notes (Signed)
Inpatient Diabetes Program Recommendations  AACE/ADA: New Consensus Statement on Inpatient Glycemic Control  Target Ranges:  Prepandial:   less than 140 mg/dL      Peak postprandial:   less than 180 mg/dL (1-2 hours)      Critically ill patients:  140 - 180 mg/dL   Results for Stephanie Combs, Stephanie Combs (MRN [id number]) as of 07/07/2018 09:20  Ref. Range 07/06/2018 07:34 07/06/2018 11:10 07/06/2018 16:07 07/06/2018 21:08 07/07/2018 08:14  Glucose-Capillary Latest Ref Range: 70 - 99 mg/dL 112 (H) 155 (H) 202 (H) 228 (H) 141 (H)   Review of Glycemic Control  Diabetes history: DM2 Outpatient Diabetes medications: Levemir 40 units QHS, Novolog 1-18 units TID with meals, Victoza 1.2 mg daily Current orders for Inpatient glycemic control: Levemir 40 units QHS, Novolog 0-15 units TID with meals, Novolog 0-5 units QHS  Inpatient Diabetes Program Recommendations:  Insulin - Meal Coverage: Please consider ordering Novolog 3 units TID with meals for meal coverage if patient eats at least 50% of meals.  Thanks, Barnie Alderman, RN, MSN, CDE Diabetes Coordinator Inpatient Diabetes Program [phone] (Team Pager from 8am to 5pm)

## 2018-07-08 LAB — BASIC METABOLIC PANEL
Anion gap: 7 (ref 5–15)
BUN: 35 mg/dL — ABNORMAL HIGH (ref 8–23)
CO2: 30 mmol/L (ref 22–32)
Calcium: 8.3 mg/dL — ABNORMAL LOW (ref 8.9–10.3)
Chloride: 104 mmol/L (ref 98–111)
Creatinine, Ser: 1.54 mg/dL — ABNORMAL HIGH (ref 0.44–1.00)
GFR calc Af Amer: 40 mL/min — ABNORMAL LOW (ref >60)
GFR calc non Af Amer: 34 mL/min — ABNORMAL LOW (ref >60)
Glucose, Bld: 130 mg/dL — ABNORMAL HIGH (ref 70–99)
Potassium: 3.9 mmol/L (ref 3.5–5.1)
Sodium: 141 mmol/L (ref 135–145)

## 2018-07-08 LAB — GLUCOSE, CAPILLARY
Glucose-Capillary: 115 mg/dL — ABNORMAL HIGH (ref 70–99)
Glucose-Capillary: 170 mg/dL — ABNORMAL HIGH (ref 70–99)
Glucose-Capillary: 221 mg/dL — ABNORMAL HIGH (ref 70–99)

## 2018-07-08 MED ORDER — ALPRAZOLAM 1 MG PO TABS
1.0000 mg | ORAL_TABLET | Freq: Three times a day (TID) | ORAL | Status: DC | PRN
  Administered 2018-07-08: 1 mg via ORAL
  Filled 2018-07-08: qty 1

## 2018-07-08 MED ORDER — FUROSEMIDE 20 MG PO TABS: ORAL_TABLET | ORAL | 0 refills | Status: DC

## 2018-07-08 MED ORDER — TIZANIDINE HCL 4 MG PO TABS: 4.0000 mg | ORAL_TABLET | Freq: Three times a day (TID) | ORAL | 0 refills | Status: DC | PRN

## 2018-07-08 NOTE — Progress Notes (Signed)
DC instructions given to pt w/ med changes, diet, activity, and fU appointment. IV d/c'd. No distress noted. Tele d/c'd. Await daughter for transport to home via car. All questions answered.

## 2018-07-08 NOTE — Discharge Summary (Signed)
Physician Discharge Summary  Stephanie Combs ZYS:[id number] DOB: 04-Aug-1950 DOA: 07/05/2018  PCP: [name], MD  Admit date: 07/05/2018 Discharge date: 07/08/2018  Admitted From: Home Disposition: Home  Recommendations for Outpatient Follow-up:  1. Follow up with PCP in 1-2 weeks 2. Please obtain BMP/CBC in one week  Home Health: Home health PT, RN, aide Equipment/Devices:  Discharge Condition: Stable CODE STATUS: Full code Diet recommendation: Heart healthy, carb modified  Brief/Interim Summary: 68 year old female with a history of coronary artery disease, chronic diastolic heart failure, COPD, diabetes, hypertension, chronic kidney disease stage III who was admitted to the hospital with complaints of chest discomfort.  Patient ruled out for ACS with negative cardiac markers.  It was felt that her pain was likely pleuritic in nature, likely related to an underlying viral process with pleurisy.  She did not have any worsening hypoxia or tachycardia.  She is known to have significant lower extremity edema and has a history of diastolic heart failure.  She was treated with intravenous Lasix and her home dose of Lasix has been increased.  She is not having any shortness of breath and her lungs are clear at this time.  She complained of significant nausea and vomiting, but was eating 75 200% of her meals.  She also complained of diarrhea, but staff reported that she had formed stools that occurred once to twice a day.  The remainder of her medical problems remained stable.  I think she has gained maximum benefit from being in the hospital.  She can follow-up with her primary care physician as an outpatient.  Discharge Diagnoses:  Principal Problem:   Chest pain Active Problems:   COPD (chronic obstructive pulmonary disease) (HCC)   Morbid obesity (HCC)   DM type 2 (diabetes mellitus, type 2) (HCC)   HTN (hypertension), benign   CKD (chronic kidney disease), stage III (HCC)   Chronic  diastolic CHF (congestive heart failure) (HCC)   Hypothyroidism   Anemia in CKD (chronic kidney disease)   Edema of lower extremity   Bilateral edema of lower extremity   Peripheral edema   Uncontrolled type 2 diabetes mellitus with stage 3 chronic kidney disease (HCC)   Personal history of noncompliance with medical treatment, presenting hazards to health   Vitamin D deficiency   Chronic respiratory failure with hypoxia (HCC)   CAD (coronary artery disease)   Chronic pain   On home O2   Opioid dependence ([place])    Discharge Instructions  Discharge Instructions    Diet - low sodium heart healthy   Complete by:  As directed    Increase activity slowly   Complete by:  As directed      Allergies as of 07/08/2018      Reactions   Dilaudid [hydromorphone Hcl] Itching   Midodrine Hcl Swelling   After one dose had anaphylactic reaction, had to call EMS.   Actifed Cold-allergy [chlorpheniramine-phenylephrine]    "I was sick and red and it didn't agree with me at all"   Doxycycline Nausea And Vomiting   Other Cough   Pt states she is allergic to ragweed and that she starts coughing and sneezing like crazy   Penicillins Hives, Itching   Tolerates Rocephin Has patient had a PCN reaction causing immediate rash, facial/tongue/throat swelling, SOB or lightheadedness with hypotension: Yes Has patient had a PCN reaction causing severe rash involving mucus membranes or skin necrosis: No Has patient had a PCN reaction that required hospitalization Yes Has patient had a PCN  reaction occurring within the last 10 years: No If all of the above answers are "NO", then may proceed with Cephalosporin use.   Reglan [metoclopramide] Itching   Valium [diazepam] Itching   Vistaril [hydroxyzine Hcl] Itching      Medication List    STOP taking these medications   OZEMPIC (0.25 OR 0.5 MG/DOSE) 2 MG/1.5ML Sopn Generic drug:  Semaglutide(0.25 or 0.5MG /DOS)     TAKE these medications   albuterol  (2.5 MG/3ML) 0.083% nebulizer solution Commonly known as:  PROVENTIL Take 2.5 mg by nebulization 2 (two) times daily as needed for wheezing or shortness of breath.   albuterol 108 (90 Base) MCG/ACT inhaler Commonly known as:  PROAIR HFA Inhale 2 puffs into the lungs every 4 (four) hours as needed. For shortness of breath   ALPRAZolam 1 MG tablet Commonly known as:  XANAX Take 1 mg by mouth 3 (three) times daily as needed for anxiety or sleep.   aspirin EC 81 MG tablet Take 81 mg by mouth daily.   atorvastatin 80 MG tablet Commonly known as:  LIPITOR Take 1 tablet (80 mg total) by mouth daily at 6 PM. What changed:  when to take this   clopidogrel 75 MG tablet Commonly known as:  PLAVIX Take 1 tablet (75 mg total) by mouth daily.   docusate sodium 100 MG capsule Commonly known as:  COLACE Take 300 mg by mouth daily.   ferrous sulfate 325 (65 FE) MG tablet Take 1 tablet (325 mg total) by mouth daily with breakfast.   FISH OIL PO Take 1 capsule by mouth daily.   furosemide 20 MG tablet Commonly known as:  LASIX Take 40mg  po in the morning and 20mg  in the evening What changed:    how much to take  how to take this  when to take this  additional instructions   gabapentin 600 MG tablet Commonly known as:  NEURONTIN Take 600 mg by mouth 4 (four) times daily.   Gerhardt's butt cream Crea Apply 1 application topically 3 (three) times daily.   insulin aspart 100 UNIT/ML injection Commonly known as:  novoLOG Inject 1-18 Units into the skin 3 (three) times daily with meals.   insulin detemir 100 UNIT/ML injection Commonly known as:  LEVEMIR Inject 0.4 mLs (40 Units total) into the skin at bedtime.   levothyroxine 200 MCG tablet Commonly known as:  SYNTHROID, LEVOTHROID Take 1 tablet by mouth daily.   nitroGLYCERIN 0.4 MG SL tablet Commonly known as:  NITROSTAT Place 0.4 mg under the tongue every 5 (five) minutes as needed for chest pain.   nystatin  powder Commonly known as:  MYCOSTATIN/NYSTOP Apply topically 2 (two) times daily.   ondansetron 8 MG disintegrating tablet Commonly known as:  ZOFRAN-ODT Take 8 mg by mouth every 8 (eight) hours as needed for nausea or vomiting.   oxyCODONE-acetaminophen 10-325 MG tablet Commonly known as:  PERCOCET Take 1 tablet by mouth every 6 (six) hours as needed for pain.   OXYGEN Inhale 2 L into the lungs continuous.   pantoprazole 40 MG tablet Commonly known as:  PROTONIX Take 1 tablet (40 mg total) by mouth 2 (two) times daily.   potassium chloride 10 MEQ CR capsule Commonly known as:  MICRO-K Take 2 capsules (20 mEq total) by mouth 2 (two) times daily. *May take one additional tablet as needed for cramping   SURE COMFORT INS SYR 1CC/28G 28G X 1/2" 1 ML Misc Generic drug:  INS SYRINGE/NEEDLE 1CC/28G USE TO INJECT  INSULIN UP TO 4 TIMES DAILY.   tiZANidine 4 MG tablet Commonly known as:  ZANAFLEX Take 1 tablet (4 mg total) by mouth every 8 (eight) hours as needed for muscle spasms. What changed:    how much to take  how to take this  when to take this  reasons to take this   topiramate 50 MG tablet Commonly known as:  TOPAMAX Take 150 mg by mouth every evening.   umeclidinium-vilanterol 62.5-25 MCG/INH Aepb Commonly known as:  ANORO ELLIPTA Inhale 1 puff into the lungs daily. What changed:    when to take this  reasons to take this   VICTOZA 18 MG/3ML Sopn Generic drug:  liraglutide Inject 1.2 mg into the skin daily.   Vitamin D3 125 MCG (5000 UT) Caps Take 1 capsule (5,000 Units total) by mouth daily.       Allergies  Allergen Reactions  . Dilaudid [Hydromorphone Hcl] Itching  . Midodrine Hcl Swelling    After one dose had anaphylactic reaction, had to call EMS.  Marland Kitchen Actifed Cold-Allergy [Chlorpheniramine-Phenylephrine]     "I was sick and red and it didn't agree with me at all"  . Doxycycline Nausea And Vomiting  . Other Cough    Pt states she is allergic  to ragweed and that she starts coughing and sneezing like crazy  . Penicillins Hives and Itching    Tolerates Rocephin Has patient had a PCN reaction causing immediate rash, facial/tongue/throat swelling, SOB or lightheadedness with hypotension: Yes Has patient had a PCN reaction causing severe rash involving mucus membranes or skin necrosis: No Has patient had a PCN reaction that required hospitalization Yes Has patient had a PCN reaction occurring within the last 10 years: No If all of the above answers are "NO", then may proceed with Cephalosporin use.   . Reglan [Metoclopramide] Itching  . Valium [Diazepam] Itching  . Vistaril [Hydroxyzine Hcl] Itching    Consultations:     Procedures/Studies: Dg Chest 1 View  Result Date: 07/05/2018 CLINICAL DATA:  Left-sided chest pain. EXAM: CHEST  1 VIEW COMPARISON:  Chest x-ray dated April 06, 2018. FINDINGS: The patient is rotated to the right. Stable cardiomegaly. New mild pulmonary vascular congestion. Mild bibasilar atelectasis. No focal consolidation, pleural effusion, or pneumothorax. No acute osseous abnormality. IMPRESSION: 1. New mild pulmonary vascular congestion. 2. Mild bibasilar atelectasis. Electronically Signed   By: Titus Dubin M.D.   On: 07/05/2018 12:05      Subjective: Shortness of breath is better.  Feels as though she may be having some diarrhea.  Still has some right-sided chest pain with deep inspiration.  Staff notes that she is able to get up fairly independently and walk to the bathroom without complaining of shortness of breath or chest pain.  Discharge Exam: Vitals:   07/07/18 0734 07/07/18 2207 07/08/18 0521 07/08/18 0813  BP: (!) 118/55 (!) 126/55 (!) 127/53   Pulse: 66 84 69   Resp: 18 20 18    Temp: 98.2 F (36.8 C) 98.8 F (37.1 C) 98.3 F (36.8 C)   TempSrc: Oral Oral Oral   SpO2: 100% 99% 99% 98%  Weight:   131.3 kg   Height:        General: Pt is alert, awake, not in acute  distress Cardiovascular: RRR, S1/S2 +, no rubs, no gallops Respiratory: CTA bilaterally, no wheezing, no rhonchi Abdominal: Soft, NT, ND, bowel sounds + Extremities: no edema, no cyanosis    The results of significant diagnostics from this  hospitalization (including imaging, microbiology, ancillary and laboratory) are listed below for reference.     Microbiology: Recent Results (from the past 240 hour(s))  MRSA PCR Screening     Status: None   Collection Time: 07/05/18  5:38 PM  Result Value Ref Range Status   MRSA by PCR NEGATIVE NEGATIVE Final    Comment:        The GeneXpert MRSA Assay (FDA approved for NASAL specimens only), is one component of a comprehensive MRSA colonization surveillance program. It is not intended to diagnose MRSA infection nor to guide or monitor treatment for MRSA infections. Performed at [facility], [address], [place], [place] [address]      Labs: BNP (last 3 results) Recent Labs    01/06/18 1820 07/05/18 1138  BNP 257.0* 536.1*   Basic Metabolic Panel: Recent Labs  Lab 07/05/18 1138 07/06/18 0545 07/07/18 0531 07/08/18 0504  NA 137 140 140 141  K 4.6 4.3 3.9 3.9  CL 104 108 105 104  CO2 26 27 28 30   GLUCOSE 193* 129* 181* 130*  BUN 31* 30* 32* 35*  CREATININE 1.54* 1.54* 1.56* 1.54*  CALCIUM 8.5* 8.2* 8.6* 8.3*   Liver Function Tests: Recent Labs  Lab 07/06/18 0545  AST 11*  ALT 8  ALKPHOS 64  BILITOT 0.4  PROT 5.5*  ALBUMIN 3.0*   No results for input(s): LIPASE, AMYLASE in the last 168 hours. No results for input(s): AMMONIA in the last 168 hours. CBC: Recent Labs  Lab 07/05/18 1138  WBC 7.0  HGB 8.8*  HCT 31.1*  MCV 88.1  PLT 317   Cardiac Enzymes: Recent Labs  Lab 07/05/18 1138 07/05/18 1648 07/05/18 2252  TROPONINI <0.03 <0.03 <0.03   BNP: Invalid input(s): POCBNP CBG: Recent Labs  Lab 07/07/18 1624 07/07/18 2209 07/08/18 0803 07/08/18 1201 07/08/18 1606  GLUCAP 180* 393* 115* 170*  221*   D-Dimer No results for input(s): DDIMER in the last 72 hours. Hgb A1c No results for input(s): HGBA1C in the last 72 hours. Lipid Profile Recent Labs    07/06/18 0545  CHOL 90  HDL 35*  LDLCALC 37  TRIG 90  CHOLHDL 2.6   Thyroid function studies No results for input(s): TSH, T4TOTAL, T3FREE, THYROIDAB in the last 72 hours.  Invalid input(s): FREET3 Anemia work up No results for input(s): VITAMINB12, FOLATE, FERRITIN, TIBC, IRON, RETICCTPCT in the last 72 hours. Urinalysis    Component Value Date/Time   COLORURINE YELLOW 04/05/2018 1559   APPEARANCEUR HAZY (A) 04/05/2018 1559   LABSPEC >1.030 (H) 04/05/2018 1559   PHURINE 5.5 04/05/2018 1559   GLUCOSEU NEGATIVE 04/05/2018 1559   HGBUR SMALL (A) 04/05/2018 1559   BILIRUBINUR SMALL (A) 04/05/2018 1559   KETONESUR 15 (A) 04/05/2018 1559   PROTEINUR 100 (A) 04/05/2018 1559   UROBILINOGEN 0.2 12/04/2014 0650   NITRITE NEGATIVE 04/05/2018 1559   LEUKOCYTESUR SMALL (A) 04/05/2018 1559   Sepsis Labs Invalid input(s): PROCALCITONIN,  WBC,  LACTICIDVEN Microbiology Recent Results (from the past 240 hour(s))  MRSA PCR Screening     Status: None   Collection Time: 07/05/18  5:38 PM  Result Value Ref Range Status   MRSA by PCR NEGATIVE NEGATIVE Final    Comment:        The GeneXpert MRSA Assay (FDA approved for NASAL specimens only), is one component of a comprehensive MRSA colonization surveillance program. It is not intended to diagnose MRSA infection nor to guide or monitor treatment for MRSA infections. Performed  at [facility], [address] [place], [place] [address]      Time coordinating discharge: 95mins  SIGNED:   Kathie Dike, MD  [facility] 07/08/2018, 8:03 PM   If 7PM-7AM, please contact night-coverage [email or web address]

## 2018-07-08 NOTE — Care Management Note (Signed)
Case Management Note  Patient Details  Name: Stephanie Combs MRN: [id number] Date of Birth: [birth year]  Subjective/Objective:    Admitted with CP. Pt from home, lives alone. Has home O2. Recently revoked hospice and refused to sign paperwork accepting [facility] services again (per chart review). Pt active with [facility] prior to Hospice services. HH rep agreeable to accept pt back on to services. Pt aware HH has 48 hrs to make first visit.                Action/Plan: DC home today with HH. Referral given to [place] Mall, [facility] rep.   Expected Discharge Date:  07/08/18               Expected Discharge Plan:  [place]  In-House Referral:  NA  Discharge planning Services  CM Consult  Post Acute Care Choice:  Home Health Choice offered to:  Patient  HH Arranged:  RN, PT [facility] Agency:  [place]  Status of Service:  Completed, signed off  Sherald Barge, RN 07/08/2018, 1:40 PM

## 2018-07-09 DIAGNOSIS — E039 Hypothyroidism, unspecified: Secondary | ICD-10-CM | POA: Diagnosis not present

## 2018-07-09 DIAGNOSIS — Z9981 Dependence on supplemental oxygen: Secondary | ICD-10-CM | POA: Diagnosis not present

## 2018-07-09 DIAGNOSIS — D631 Anemia in chronic kidney disease: Secondary | ICD-10-CM | POA: Diagnosis not present

## 2018-07-09 DIAGNOSIS — E559 Vitamin D deficiency, unspecified: Secondary | ICD-10-CM | POA: Diagnosis not present

## 2018-07-09 DIAGNOSIS — I130 Hypertensive heart and chronic kidney disease with heart failure and stage 1 through stage 4 chronic kidney disease, or unspecified chronic kidney disease: Secondary | ICD-10-CM | POA: Diagnosis not present

## 2018-07-09 DIAGNOSIS — J9611 Chronic respiratory failure with hypoxia: Secondary | ICD-10-CM | POA: Diagnosis not present

## 2018-07-09 DIAGNOSIS — I5032 Chronic diastolic (congestive) heart failure: Secondary | ICD-10-CM | POA: Diagnosis not present

## 2018-07-09 DIAGNOSIS — E1122 Type 2 diabetes mellitus with diabetic chronic kidney disease: Secondary | ICD-10-CM | POA: Diagnosis not present

## 2018-07-09 DIAGNOSIS — J449 Chronic obstructive pulmonary disease, unspecified: Secondary | ICD-10-CM | POA: Diagnosis not present

## 2018-07-09 DIAGNOSIS — N183 Chronic kidney disease, stage 3 (moderate): Secondary | ICD-10-CM | POA: Diagnosis not present

## 2018-07-09 DIAGNOSIS — Z7902 Long term (current) use of antithrombotics/antiplatelets: Secondary | ICD-10-CM | POA: Diagnosis not present

## 2018-07-09 DIAGNOSIS — I2510 Atherosclerotic heart disease of native coronary artery without angina pectoris: Secondary | ICD-10-CM | POA: Diagnosis not present

## 2018-07-09 DIAGNOSIS — Z7982 Long term (current) use of aspirin: Secondary | ICD-10-CM | POA: Diagnosis not present

## 2018-07-09 DIAGNOSIS — Z794 Long term (current) use of insulin: Secondary | ICD-10-CM | POA: Diagnosis not present

## 2018-07-09 DIAGNOSIS — G8929 Other chronic pain: Secondary | ICD-10-CM | POA: Diagnosis not present

## 2018-07-12 DIAGNOSIS — E1122 Type 2 diabetes mellitus with diabetic chronic kidney disease: Secondary | ICD-10-CM | POA: Diagnosis not present

## 2018-07-12 DIAGNOSIS — I130 Hypertensive heart and chronic kidney disease with heart failure and stage 1 through stage 4 chronic kidney disease, or unspecified chronic kidney disease: Secondary | ICD-10-CM | POA: Diagnosis not present

## 2018-07-12 DIAGNOSIS — I2510 Atherosclerotic heart disease of native coronary artery without angina pectoris: Secondary | ICD-10-CM | POA: Diagnosis not present

## 2018-07-12 DIAGNOSIS — Z794 Long term (current) use of insulin: Secondary | ICD-10-CM | POA: Diagnosis not present

## 2018-07-12 DIAGNOSIS — G8929 Other chronic pain: Secondary | ICD-10-CM | POA: Diagnosis not present

## 2018-07-12 DIAGNOSIS — Z7982 Long term (current) use of aspirin: Secondary | ICD-10-CM | POA: Diagnosis not present

## 2018-07-12 DIAGNOSIS — J9611 Chronic respiratory failure with hypoxia: Secondary | ICD-10-CM | POA: Diagnosis not present

## 2018-07-12 DIAGNOSIS — D631 Anemia in chronic kidney disease: Secondary | ICD-10-CM | POA: Diagnosis not present

## 2018-07-12 DIAGNOSIS — Z7902 Long term (current) use of antithrombotics/antiplatelets: Secondary | ICD-10-CM | POA: Diagnosis not present

## 2018-07-12 DIAGNOSIS — J449 Chronic obstructive pulmonary disease, unspecified: Secondary | ICD-10-CM | POA: Diagnosis not present

## 2018-07-12 DIAGNOSIS — N183 Chronic kidney disease, stage 3 (moderate): Secondary | ICD-10-CM | POA: Diagnosis not present

## 2018-07-12 DIAGNOSIS — E559 Vitamin D deficiency, unspecified: Secondary | ICD-10-CM | POA: Diagnosis not present

## 2018-07-12 DIAGNOSIS — E039 Hypothyroidism, unspecified: Secondary | ICD-10-CM | POA: Diagnosis not present

## 2018-07-12 DIAGNOSIS — I5032 Chronic diastolic (congestive) heart failure: Secondary | ICD-10-CM | POA: Diagnosis not present

## 2018-07-12 DIAGNOSIS — Z9981 Dependence on supplemental oxygen: Secondary | ICD-10-CM | POA: Diagnosis not present

## 2018-07-13 ENCOUNTER — Ambulatory Visit: Payer: Medicare HMO | Admitting: Student

## 2018-07-13 DIAGNOSIS — I130 Hypertensive heart and chronic kidney disease with heart failure and stage 1 through stage 4 chronic kidney disease, or unspecified chronic kidney disease: Secondary | ICD-10-CM | POA: Diagnosis not present

## 2018-07-13 DIAGNOSIS — E1122 Type 2 diabetes mellitus with diabetic chronic kidney disease: Secondary | ICD-10-CM | POA: Diagnosis not present

## 2018-07-13 DIAGNOSIS — E039 Hypothyroidism, unspecified: Secondary | ICD-10-CM | POA: Diagnosis not present

## 2018-07-13 DIAGNOSIS — Z9981 Dependence on supplemental oxygen: Secondary | ICD-10-CM | POA: Diagnosis not present

## 2018-07-13 DIAGNOSIS — Z794 Long term (current) use of insulin: Secondary | ICD-10-CM | POA: Diagnosis not present

## 2018-07-13 DIAGNOSIS — E559 Vitamin D deficiency, unspecified: Secondary | ICD-10-CM | POA: Diagnosis not present

## 2018-07-13 DIAGNOSIS — Z7902 Long term (current) use of antithrombotics/antiplatelets: Secondary | ICD-10-CM | POA: Diagnosis not present

## 2018-07-13 DIAGNOSIS — Z7982 Long term (current) use of aspirin: Secondary | ICD-10-CM | POA: Diagnosis not present

## 2018-07-13 DIAGNOSIS — D631 Anemia in chronic kidney disease: Secondary | ICD-10-CM | POA: Diagnosis not present

## 2018-07-13 DIAGNOSIS — G8929 Other chronic pain: Secondary | ICD-10-CM | POA: Diagnosis not present

## 2018-07-13 DIAGNOSIS — J449 Chronic obstructive pulmonary disease, unspecified: Secondary | ICD-10-CM | POA: Diagnosis not present

## 2018-07-13 DIAGNOSIS — N183 Chronic kidney disease, stage 3 (moderate): Secondary | ICD-10-CM | POA: Diagnosis not present

## 2018-07-13 DIAGNOSIS — I2510 Atherosclerotic heart disease of native coronary artery without angina pectoris: Secondary | ICD-10-CM | POA: Diagnosis not present

## 2018-07-13 DIAGNOSIS — I5032 Chronic diastolic (congestive) heart failure: Secondary | ICD-10-CM | POA: Diagnosis not present

## 2018-07-13 DIAGNOSIS — J9611 Chronic respiratory failure with hypoxia: Secondary | ICD-10-CM | POA: Diagnosis not present

## 2018-07-14 DIAGNOSIS — I2510 Atherosclerotic heart disease of native coronary artery without angina pectoris: Secondary | ICD-10-CM | POA: Diagnosis not present

## 2018-07-14 DIAGNOSIS — N183 Chronic kidney disease, stage 3 (moderate): Secondary | ICD-10-CM | POA: Diagnosis not present

## 2018-07-14 DIAGNOSIS — Z7982 Long term (current) use of aspirin: Secondary | ICD-10-CM | POA: Diagnosis not present

## 2018-07-14 DIAGNOSIS — E039 Hypothyroidism, unspecified: Secondary | ICD-10-CM | POA: Diagnosis not present

## 2018-07-14 DIAGNOSIS — E559 Vitamin D deficiency, unspecified: Secondary | ICD-10-CM | POA: Diagnosis not present

## 2018-07-14 DIAGNOSIS — I5032 Chronic diastolic (congestive) heart failure: Secondary | ICD-10-CM | POA: Diagnosis not present

## 2018-07-14 DIAGNOSIS — D631 Anemia in chronic kidney disease: Secondary | ICD-10-CM | POA: Diagnosis not present

## 2018-07-14 DIAGNOSIS — Z794 Long term (current) use of insulin: Secondary | ICD-10-CM | POA: Diagnosis not present

## 2018-07-14 DIAGNOSIS — Z7902 Long term (current) use of antithrombotics/antiplatelets: Secondary | ICD-10-CM | POA: Diagnosis not present

## 2018-07-14 DIAGNOSIS — Z9981 Dependence on supplemental oxygen: Secondary | ICD-10-CM | POA: Diagnosis not present

## 2018-07-14 DIAGNOSIS — E1122 Type 2 diabetes mellitus with diabetic chronic kidney disease: Secondary | ICD-10-CM | POA: Diagnosis not present

## 2018-07-14 DIAGNOSIS — G8929 Other chronic pain: Secondary | ICD-10-CM | POA: Diagnosis not present

## 2018-07-14 DIAGNOSIS — J449 Chronic obstructive pulmonary disease, unspecified: Secondary | ICD-10-CM | POA: Diagnosis not present

## 2018-07-14 DIAGNOSIS — J9611 Chronic respiratory failure with hypoxia: Secondary | ICD-10-CM | POA: Diagnosis not present

## 2018-07-14 DIAGNOSIS — I130 Hypertensive heart and chronic kidney disease with heart failure and stage 1 through stage 4 chronic kidney disease, or unspecified chronic kidney disease: Secondary | ICD-10-CM | POA: Diagnosis not present

## 2018-07-15 DIAGNOSIS — J449 Chronic obstructive pulmonary disease, unspecified: Secondary | ICD-10-CM | POA: Diagnosis not present

## 2018-07-15 DIAGNOSIS — G8929 Other chronic pain: Secondary | ICD-10-CM | POA: Diagnosis not present

## 2018-07-15 DIAGNOSIS — I2510 Atherosclerotic heart disease of native coronary artery without angina pectoris: Secondary | ICD-10-CM | POA: Diagnosis not present

## 2018-07-15 DIAGNOSIS — I5032 Chronic diastolic (congestive) heart failure: Secondary | ICD-10-CM | POA: Diagnosis not present

## 2018-07-15 DIAGNOSIS — Z794 Long term (current) use of insulin: Secondary | ICD-10-CM | POA: Diagnosis not present

## 2018-07-15 DIAGNOSIS — Z7982 Long term (current) use of aspirin: Secondary | ICD-10-CM | POA: Diagnosis not present

## 2018-07-15 DIAGNOSIS — E1122 Type 2 diabetes mellitus with diabetic chronic kidney disease: Secondary | ICD-10-CM | POA: Diagnosis not present

## 2018-07-15 DIAGNOSIS — Z7902 Long term (current) use of antithrombotics/antiplatelets: Secondary | ICD-10-CM | POA: Diagnosis not present

## 2018-07-15 DIAGNOSIS — I130 Hypertensive heart and chronic kidney disease with heart failure and stage 1 through stage 4 chronic kidney disease, or unspecified chronic kidney disease: Secondary | ICD-10-CM | POA: Diagnosis not present

## 2018-07-15 DIAGNOSIS — J9611 Chronic respiratory failure with hypoxia: Secondary | ICD-10-CM | POA: Diagnosis not present

## 2018-07-15 DIAGNOSIS — E1165 Type 2 diabetes mellitus with hyperglycemia: Secondary | ICD-10-CM | POA: Diagnosis not present

## 2018-07-15 DIAGNOSIS — N183 Chronic kidney disease, stage 3 (moderate): Secondary | ICD-10-CM | POA: Diagnosis not present

## 2018-07-15 DIAGNOSIS — E559 Vitamin D deficiency, unspecified: Secondary | ICD-10-CM | POA: Diagnosis not present

## 2018-07-15 DIAGNOSIS — Z9981 Dependence on supplemental oxygen: Secondary | ICD-10-CM | POA: Diagnosis not present

## 2018-07-15 DIAGNOSIS — D631 Anemia in chronic kidney disease: Secondary | ICD-10-CM | POA: Diagnosis not present

## 2018-07-15 DIAGNOSIS — E039 Hypothyroidism, unspecified: Secondary | ICD-10-CM | POA: Diagnosis not present

## 2018-07-17 DIAGNOSIS — J9611 Chronic respiratory failure with hypoxia: Secondary | ICD-10-CM | POA: Diagnosis not present

## 2018-07-17 DIAGNOSIS — N183 Chronic kidney disease, stage 3 (moderate): Secondary | ICD-10-CM | POA: Diagnosis not present

## 2018-07-17 DIAGNOSIS — E039 Hypothyroidism, unspecified: Secondary | ICD-10-CM | POA: Diagnosis not present

## 2018-07-17 DIAGNOSIS — E559 Vitamin D deficiency, unspecified: Secondary | ICD-10-CM | POA: Diagnosis not present

## 2018-07-17 DIAGNOSIS — D631 Anemia in chronic kidney disease: Secondary | ICD-10-CM | POA: Diagnosis not present

## 2018-07-17 DIAGNOSIS — G8929 Other chronic pain: Secondary | ICD-10-CM | POA: Diagnosis not present

## 2018-07-17 DIAGNOSIS — Z794 Long term (current) use of insulin: Secondary | ICD-10-CM | POA: Diagnosis not present

## 2018-07-17 DIAGNOSIS — I5032 Chronic diastolic (congestive) heart failure: Secondary | ICD-10-CM | POA: Diagnosis not present

## 2018-07-17 DIAGNOSIS — I130 Hypertensive heart and chronic kidney disease with heart failure and stage 1 through stage 4 chronic kidney disease, or unspecified chronic kidney disease: Secondary | ICD-10-CM | POA: Diagnosis not present

## 2018-07-17 DIAGNOSIS — E1122 Type 2 diabetes mellitus with diabetic chronic kidney disease: Secondary | ICD-10-CM | POA: Diagnosis not present

## 2018-07-17 DIAGNOSIS — Z7902 Long term (current) use of antithrombotics/antiplatelets: Secondary | ICD-10-CM | POA: Diagnosis not present

## 2018-07-17 DIAGNOSIS — J449 Chronic obstructive pulmonary disease, unspecified: Secondary | ICD-10-CM | POA: Diagnosis not present

## 2018-07-17 DIAGNOSIS — Z9981 Dependence on supplemental oxygen: Secondary | ICD-10-CM | POA: Diagnosis not present

## 2018-07-17 DIAGNOSIS — I2510 Atherosclerotic heart disease of native coronary artery without angina pectoris: Secondary | ICD-10-CM | POA: Diagnosis not present

## 2018-07-17 DIAGNOSIS — Z7982 Long term (current) use of aspirin: Secondary | ICD-10-CM | POA: Diagnosis not present

## 2018-07-18 DIAGNOSIS — J449 Chronic obstructive pulmonary disease, unspecified: Secondary | ICD-10-CM | POA: Diagnosis not present

## 2018-07-18 DIAGNOSIS — Z794 Long term (current) use of insulin: Secondary | ICD-10-CM | POA: Diagnosis not present

## 2018-07-18 DIAGNOSIS — I5032 Chronic diastolic (congestive) heart failure: Secondary | ICD-10-CM | POA: Diagnosis not present

## 2018-07-18 DIAGNOSIS — J9611 Chronic respiratory failure with hypoxia: Secondary | ICD-10-CM | POA: Diagnosis not present

## 2018-07-18 DIAGNOSIS — E559 Vitamin D deficiency, unspecified: Secondary | ICD-10-CM | POA: Diagnosis not present

## 2018-07-18 DIAGNOSIS — I2510 Atherosclerotic heart disease of native coronary artery without angina pectoris: Secondary | ICD-10-CM | POA: Diagnosis not present

## 2018-07-18 DIAGNOSIS — Z7982 Long term (current) use of aspirin: Secondary | ICD-10-CM | POA: Diagnosis not present

## 2018-07-18 DIAGNOSIS — Z9981 Dependence on supplemental oxygen: Secondary | ICD-10-CM | POA: Diagnosis not present

## 2018-07-18 DIAGNOSIS — Z7902 Long term (current) use of antithrombotics/antiplatelets: Secondary | ICD-10-CM | POA: Diagnosis not present

## 2018-07-18 DIAGNOSIS — I130 Hypertensive heart and chronic kidney disease with heart failure and stage 1 through stage 4 chronic kidney disease, or unspecified chronic kidney disease: Secondary | ICD-10-CM | POA: Diagnosis not present

## 2018-07-18 DIAGNOSIS — D631 Anemia in chronic kidney disease: Secondary | ICD-10-CM | POA: Diagnosis not present

## 2018-07-18 DIAGNOSIS — G8929 Other chronic pain: Secondary | ICD-10-CM | POA: Diagnosis not present

## 2018-07-18 DIAGNOSIS — E1122 Type 2 diabetes mellitus with diabetic chronic kidney disease: Secondary | ICD-10-CM | POA: Diagnosis not present

## 2018-07-18 DIAGNOSIS — E039 Hypothyroidism, unspecified: Secondary | ICD-10-CM | POA: Diagnosis not present

## 2018-07-18 DIAGNOSIS — N183 Chronic kidney disease, stage 3 (moderate): Secondary | ICD-10-CM | POA: Diagnosis not present

## 2018-07-20 DIAGNOSIS — E039 Hypothyroidism, unspecified: Secondary | ICD-10-CM | POA: Diagnosis not present

## 2018-07-20 DIAGNOSIS — I5032 Chronic diastolic (congestive) heart failure: Secondary | ICD-10-CM | POA: Diagnosis not present

## 2018-07-20 DIAGNOSIS — D631 Anemia in chronic kidney disease: Secondary | ICD-10-CM | POA: Diagnosis not present

## 2018-07-20 DIAGNOSIS — Z7902 Long term (current) use of antithrombotics/antiplatelets: Secondary | ICD-10-CM | POA: Diagnosis not present

## 2018-07-20 DIAGNOSIS — Z7982 Long term (current) use of aspirin: Secondary | ICD-10-CM | POA: Diagnosis not present

## 2018-07-20 DIAGNOSIS — E559 Vitamin D deficiency, unspecified: Secondary | ICD-10-CM | POA: Diagnosis not present

## 2018-07-20 DIAGNOSIS — I2510 Atherosclerotic heart disease of native coronary artery without angina pectoris: Secondary | ICD-10-CM | POA: Diagnosis not present

## 2018-07-20 DIAGNOSIS — E1122 Type 2 diabetes mellitus with diabetic chronic kidney disease: Secondary | ICD-10-CM | POA: Diagnosis not present

## 2018-07-20 DIAGNOSIS — J9611 Chronic respiratory failure with hypoxia: Secondary | ICD-10-CM | POA: Diagnosis not present

## 2018-07-20 DIAGNOSIS — Z794 Long term (current) use of insulin: Secondary | ICD-10-CM | POA: Diagnosis not present

## 2018-07-20 DIAGNOSIS — N183 Chronic kidney disease, stage 3 (moderate): Secondary | ICD-10-CM | POA: Diagnosis not present

## 2018-07-20 DIAGNOSIS — Z9981 Dependence on supplemental oxygen: Secondary | ICD-10-CM | POA: Diagnosis not present

## 2018-07-20 DIAGNOSIS — I130 Hypertensive heart and chronic kidney disease with heart failure and stage 1 through stage 4 chronic kidney disease, or unspecified chronic kidney disease: Secondary | ICD-10-CM | POA: Diagnosis not present

## 2018-07-20 DIAGNOSIS — G8929 Other chronic pain: Secondary | ICD-10-CM | POA: Diagnosis not present

## 2018-07-20 DIAGNOSIS — J449 Chronic obstructive pulmonary disease, unspecified: Secondary | ICD-10-CM | POA: Diagnosis not present

## 2018-07-21 DIAGNOSIS — J9611 Chronic respiratory failure with hypoxia: Secondary | ICD-10-CM | POA: Diagnosis not present

## 2018-07-21 DIAGNOSIS — Z7982 Long term (current) use of aspirin: Secondary | ICD-10-CM | POA: Diagnosis not present

## 2018-07-21 DIAGNOSIS — Z794 Long term (current) use of insulin: Secondary | ICD-10-CM | POA: Diagnosis not present

## 2018-07-21 DIAGNOSIS — I130 Hypertensive heart and chronic kidney disease with heart failure and stage 1 through stage 4 chronic kidney disease, or unspecified chronic kidney disease: Secondary | ICD-10-CM | POA: Diagnosis not present

## 2018-07-21 DIAGNOSIS — E039 Hypothyroidism, unspecified: Secondary | ICD-10-CM | POA: Diagnosis not present

## 2018-07-21 DIAGNOSIS — N183 Chronic kidney disease, stage 3 (moderate): Secondary | ICD-10-CM | POA: Diagnosis not present

## 2018-07-21 DIAGNOSIS — J449 Chronic obstructive pulmonary disease, unspecified: Secondary | ICD-10-CM | POA: Diagnosis not present

## 2018-07-21 DIAGNOSIS — E1122 Type 2 diabetes mellitus with diabetic chronic kidney disease: Secondary | ICD-10-CM | POA: Diagnosis not present

## 2018-07-21 DIAGNOSIS — Z9981 Dependence on supplemental oxygen: Secondary | ICD-10-CM | POA: Diagnosis not present

## 2018-07-21 DIAGNOSIS — I2510 Atherosclerotic heart disease of native coronary artery without angina pectoris: Secondary | ICD-10-CM | POA: Diagnosis not present

## 2018-07-21 DIAGNOSIS — E559 Vitamin D deficiency, unspecified: Secondary | ICD-10-CM | POA: Diagnosis not present

## 2018-07-21 DIAGNOSIS — Z7902 Long term (current) use of antithrombotics/antiplatelets: Secondary | ICD-10-CM | POA: Diagnosis not present

## 2018-07-21 DIAGNOSIS — D631 Anemia in chronic kidney disease: Secondary | ICD-10-CM | POA: Diagnosis not present

## 2018-07-21 DIAGNOSIS — I5032 Chronic diastolic (congestive) heart failure: Secondary | ICD-10-CM | POA: Diagnosis not present

## 2018-07-21 DIAGNOSIS — G8929 Other chronic pain: Secondary | ICD-10-CM | POA: Diagnosis not present

## 2018-07-22 DIAGNOSIS — Z794 Long term (current) use of insulin: Secondary | ICD-10-CM | POA: Diagnosis not present

## 2018-07-22 DIAGNOSIS — Z9981 Dependence on supplemental oxygen: Secondary | ICD-10-CM | POA: Diagnosis not present

## 2018-07-22 DIAGNOSIS — I10 Essential (primary) hypertension: Secondary | ICD-10-CM | POA: Diagnosis not present

## 2018-07-22 DIAGNOSIS — J9611 Chronic respiratory failure with hypoxia: Secondary | ICD-10-CM | POA: Diagnosis not present

## 2018-07-22 DIAGNOSIS — I5032 Chronic diastolic (congestive) heart failure: Secondary | ICD-10-CM | POA: Diagnosis not present

## 2018-07-22 DIAGNOSIS — J449 Chronic obstructive pulmonary disease, unspecified: Secondary | ICD-10-CM | POA: Diagnosis not present

## 2018-07-22 DIAGNOSIS — G8929 Other chronic pain: Secondary | ICD-10-CM | POA: Diagnosis not present

## 2018-07-22 DIAGNOSIS — E1122 Type 2 diabetes mellitus with diabetic chronic kidney disease: Secondary | ICD-10-CM | POA: Diagnosis not present

## 2018-07-22 DIAGNOSIS — I2510 Atherosclerotic heart disease of native coronary artery without angina pectoris: Secondary | ICD-10-CM | POA: Diagnosis not present

## 2018-07-22 DIAGNOSIS — E559 Vitamin D deficiency, unspecified: Secondary | ICD-10-CM | POA: Diagnosis not present

## 2018-07-22 DIAGNOSIS — Z7902 Long term (current) use of antithrombotics/antiplatelets: Secondary | ICD-10-CM | POA: Diagnosis not present

## 2018-07-22 DIAGNOSIS — E039 Hypothyroidism, unspecified: Secondary | ICD-10-CM | POA: Diagnosis not present

## 2018-07-22 DIAGNOSIS — Z7982 Long term (current) use of aspirin: Secondary | ICD-10-CM | POA: Diagnosis not present

## 2018-07-22 DIAGNOSIS — D631 Anemia in chronic kidney disease: Secondary | ICD-10-CM | POA: Diagnosis not present

## 2018-07-22 DIAGNOSIS — I130 Hypertensive heart and chronic kidney disease with heart failure and stage 1 through stage 4 chronic kidney disease, or unspecified chronic kidney disease: Secondary | ICD-10-CM | POA: Diagnosis not present

## 2018-07-22 DIAGNOSIS — N183 Chronic kidney disease, stage 3 (moderate): Secondary | ICD-10-CM | POA: Diagnosis not present

## 2018-07-23 DIAGNOSIS — J9611 Chronic respiratory failure with hypoxia: Secondary | ICD-10-CM | POA: Diagnosis not present

## 2018-07-23 DIAGNOSIS — I2510 Atherosclerotic heart disease of native coronary artery without angina pectoris: Secondary | ICD-10-CM | POA: Diagnosis not present

## 2018-07-23 DIAGNOSIS — N183 Chronic kidney disease, stage 3 (moderate): Secondary | ICD-10-CM | POA: Diagnosis not present

## 2018-07-23 DIAGNOSIS — E1122 Type 2 diabetes mellitus with diabetic chronic kidney disease: Secondary | ICD-10-CM | POA: Diagnosis not present

## 2018-07-23 DIAGNOSIS — Z794 Long term (current) use of insulin: Secondary | ICD-10-CM | POA: Diagnosis not present

## 2018-07-23 DIAGNOSIS — D631 Anemia in chronic kidney disease: Secondary | ICD-10-CM | POA: Diagnosis not present

## 2018-07-23 DIAGNOSIS — Z9981 Dependence on supplemental oxygen: Secondary | ICD-10-CM | POA: Diagnosis not present

## 2018-07-23 DIAGNOSIS — I130 Hypertensive heart and chronic kidney disease with heart failure and stage 1 through stage 4 chronic kidney disease, or unspecified chronic kidney disease: Secondary | ICD-10-CM | POA: Diagnosis not present

## 2018-07-23 DIAGNOSIS — G8929 Other chronic pain: Secondary | ICD-10-CM | POA: Diagnosis not present

## 2018-07-23 DIAGNOSIS — Z7982 Long term (current) use of aspirin: Secondary | ICD-10-CM | POA: Diagnosis not present

## 2018-07-23 DIAGNOSIS — Z7902 Long term (current) use of antithrombotics/antiplatelets: Secondary | ICD-10-CM | POA: Diagnosis not present

## 2018-07-23 DIAGNOSIS — E559 Vitamin D deficiency, unspecified: Secondary | ICD-10-CM | POA: Diagnosis not present

## 2018-07-23 DIAGNOSIS — E039 Hypothyroidism, unspecified: Secondary | ICD-10-CM | POA: Diagnosis not present

## 2018-07-23 DIAGNOSIS — J449 Chronic obstructive pulmonary disease, unspecified: Secondary | ICD-10-CM | POA: Diagnosis not present

## 2018-07-23 DIAGNOSIS — I5032 Chronic diastolic (congestive) heart failure: Secondary | ICD-10-CM | POA: Diagnosis not present

## 2018-07-27 DIAGNOSIS — G8929 Other chronic pain: Secondary | ICD-10-CM | POA: Diagnosis not present

## 2018-07-27 DIAGNOSIS — I5032 Chronic diastolic (congestive) heart failure: Secondary | ICD-10-CM | POA: Diagnosis not present

## 2018-07-27 DIAGNOSIS — Z7982 Long term (current) use of aspirin: Secondary | ICD-10-CM | POA: Diagnosis not present

## 2018-07-27 DIAGNOSIS — Z7902 Long term (current) use of antithrombotics/antiplatelets: Secondary | ICD-10-CM | POA: Diagnosis not present

## 2018-07-27 DIAGNOSIS — E1122 Type 2 diabetes mellitus with diabetic chronic kidney disease: Secondary | ICD-10-CM | POA: Diagnosis not present

## 2018-07-27 DIAGNOSIS — I130 Hypertensive heart and chronic kidney disease with heart failure and stage 1 through stage 4 chronic kidney disease, or unspecified chronic kidney disease: Secondary | ICD-10-CM | POA: Diagnosis not present

## 2018-07-27 DIAGNOSIS — E559 Vitamin D deficiency, unspecified: Secondary | ICD-10-CM | POA: Diagnosis not present

## 2018-07-27 DIAGNOSIS — Z794 Long term (current) use of insulin: Secondary | ICD-10-CM | POA: Diagnosis not present

## 2018-07-27 DIAGNOSIS — J449 Chronic obstructive pulmonary disease, unspecified: Secondary | ICD-10-CM | POA: Diagnosis not present

## 2018-07-27 DIAGNOSIS — E039 Hypothyroidism, unspecified: Secondary | ICD-10-CM | POA: Diagnosis not present

## 2018-07-27 DIAGNOSIS — D631 Anemia in chronic kidney disease: Secondary | ICD-10-CM | POA: Diagnosis not present

## 2018-07-27 DIAGNOSIS — J9611 Chronic respiratory failure with hypoxia: Secondary | ICD-10-CM | POA: Diagnosis not present

## 2018-07-27 DIAGNOSIS — Z9981 Dependence on supplemental oxygen: Secondary | ICD-10-CM | POA: Diagnosis not present

## 2018-07-27 DIAGNOSIS — I2510 Atherosclerotic heart disease of native coronary artery without angina pectoris: Secondary | ICD-10-CM | POA: Diagnosis not present

## 2018-07-27 DIAGNOSIS — N183 Chronic kidney disease, stage 3 (moderate): Secondary | ICD-10-CM | POA: Diagnosis not present

## 2018-07-29 ENCOUNTER — Ambulatory Visit: Payer: Medicare Other | Admitting: Student

## 2018-07-29 DIAGNOSIS — I5032 Chronic diastolic (congestive) heart failure: Secondary | ICD-10-CM | POA: Diagnosis not present

## 2018-07-29 DIAGNOSIS — E039 Hypothyroidism, unspecified: Secondary | ICD-10-CM | POA: Diagnosis not present

## 2018-07-29 DIAGNOSIS — J9611 Chronic respiratory failure with hypoxia: Secondary | ICD-10-CM | POA: Diagnosis not present

## 2018-07-29 DIAGNOSIS — E559 Vitamin D deficiency, unspecified: Secondary | ICD-10-CM | POA: Diagnosis not present

## 2018-07-29 DIAGNOSIS — J449 Chronic obstructive pulmonary disease, unspecified: Secondary | ICD-10-CM | POA: Diagnosis not present

## 2018-07-29 DIAGNOSIS — Z7982 Long term (current) use of aspirin: Secondary | ICD-10-CM | POA: Diagnosis not present

## 2018-07-29 DIAGNOSIS — Z9981 Dependence on supplemental oxygen: Secondary | ICD-10-CM | POA: Diagnosis not present

## 2018-07-29 DIAGNOSIS — I2510 Atherosclerotic heart disease of native coronary artery without angina pectoris: Secondary | ICD-10-CM | POA: Diagnosis not present

## 2018-07-29 DIAGNOSIS — N183 Chronic kidney disease, stage 3 (moderate): Secondary | ICD-10-CM | POA: Diagnosis not present

## 2018-07-29 DIAGNOSIS — I130 Hypertensive heart and chronic kidney disease with heart failure and stage 1 through stage 4 chronic kidney disease, or unspecified chronic kidney disease: Secondary | ICD-10-CM | POA: Diagnosis not present

## 2018-07-29 DIAGNOSIS — E1122 Type 2 diabetes mellitus with diabetic chronic kidney disease: Secondary | ICD-10-CM | POA: Diagnosis not present

## 2018-07-29 DIAGNOSIS — G8929 Other chronic pain: Secondary | ICD-10-CM | POA: Diagnosis not present

## 2018-07-29 DIAGNOSIS — D631 Anemia in chronic kidney disease: Secondary | ICD-10-CM | POA: Diagnosis not present

## 2018-07-29 DIAGNOSIS — Z7902 Long term (current) use of antithrombotics/antiplatelets: Secondary | ICD-10-CM | POA: Diagnosis not present

## 2018-07-29 DIAGNOSIS — Z794 Long term (current) use of insulin: Secondary | ICD-10-CM | POA: Diagnosis not present

## 2018-08-03 DIAGNOSIS — Z794 Long term (current) use of insulin: Secondary | ICD-10-CM | POA: Diagnosis not present

## 2018-08-03 DIAGNOSIS — I2510 Atherosclerotic heart disease of native coronary artery without angina pectoris: Secondary | ICD-10-CM | POA: Diagnosis not present

## 2018-08-03 DIAGNOSIS — J449 Chronic obstructive pulmonary disease, unspecified: Secondary | ICD-10-CM | POA: Diagnosis not present

## 2018-08-03 DIAGNOSIS — D631 Anemia in chronic kidney disease: Secondary | ICD-10-CM | POA: Diagnosis not present

## 2018-08-03 DIAGNOSIS — Z7902 Long term (current) use of antithrombotics/antiplatelets: Secondary | ICD-10-CM | POA: Diagnosis not present

## 2018-08-03 DIAGNOSIS — I130 Hypertensive heart and chronic kidney disease with heart failure and stage 1 through stage 4 chronic kidney disease, or unspecified chronic kidney disease: Secondary | ICD-10-CM | POA: Diagnosis not present

## 2018-08-03 DIAGNOSIS — E559 Vitamin D deficiency, unspecified: Secondary | ICD-10-CM | POA: Diagnosis not present

## 2018-08-03 DIAGNOSIS — G8929 Other chronic pain: Secondary | ICD-10-CM | POA: Diagnosis not present

## 2018-08-03 DIAGNOSIS — Z9981 Dependence on supplemental oxygen: Secondary | ICD-10-CM | POA: Diagnosis not present

## 2018-08-03 DIAGNOSIS — N183 Chronic kidney disease, stage 3 (moderate): Secondary | ICD-10-CM | POA: Diagnosis not present

## 2018-08-03 DIAGNOSIS — E039 Hypothyroidism, unspecified: Secondary | ICD-10-CM | POA: Diagnosis not present

## 2018-08-03 DIAGNOSIS — E1122 Type 2 diabetes mellitus with diabetic chronic kidney disease: Secondary | ICD-10-CM | POA: Diagnosis not present

## 2018-08-03 DIAGNOSIS — Z7982 Long term (current) use of aspirin: Secondary | ICD-10-CM | POA: Diagnosis not present

## 2018-08-03 DIAGNOSIS — J9611 Chronic respiratory failure with hypoxia: Secondary | ICD-10-CM | POA: Diagnosis not present

## 2018-08-03 DIAGNOSIS — I5032 Chronic diastolic (congestive) heart failure: Secondary | ICD-10-CM | POA: Diagnosis not present

## 2018-08-04 DIAGNOSIS — G8929 Other chronic pain: Secondary | ICD-10-CM | POA: Diagnosis not present

## 2018-08-04 DIAGNOSIS — Z7902 Long term (current) use of antithrombotics/antiplatelets: Secondary | ICD-10-CM | POA: Diagnosis not present

## 2018-08-04 DIAGNOSIS — E039 Hypothyroidism, unspecified: Secondary | ICD-10-CM | POA: Diagnosis not present

## 2018-08-04 DIAGNOSIS — Z794 Long term (current) use of insulin: Secondary | ICD-10-CM | POA: Diagnosis not present

## 2018-08-04 DIAGNOSIS — I2510 Atherosclerotic heart disease of native coronary artery without angina pectoris: Secondary | ICD-10-CM | POA: Diagnosis not present

## 2018-08-04 DIAGNOSIS — Z7982 Long term (current) use of aspirin: Secondary | ICD-10-CM | POA: Diagnosis not present

## 2018-08-04 DIAGNOSIS — Z9981 Dependence on supplemental oxygen: Secondary | ICD-10-CM | POA: Diagnosis not present

## 2018-08-04 DIAGNOSIS — E1122 Type 2 diabetes mellitus with diabetic chronic kidney disease: Secondary | ICD-10-CM | POA: Diagnosis not present

## 2018-08-04 DIAGNOSIS — E559 Vitamin D deficiency, unspecified: Secondary | ICD-10-CM | POA: Diagnosis not present

## 2018-08-04 DIAGNOSIS — I5032 Chronic diastolic (congestive) heart failure: Secondary | ICD-10-CM | POA: Diagnosis not present

## 2018-08-04 DIAGNOSIS — J9611 Chronic respiratory failure with hypoxia: Secondary | ICD-10-CM | POA: Diagnosis not present

## 2018-08-04 DIAGNOSIS — J449 Chronic obstructive pulmonary disease, unspecified: Secondary | ICD-10-CM | POA: Diagnosis not present

## 2018-08-04 DIAGNOSIS — I130 Hypertensive heart and chronic kidney disease with heart failure and stage 1 through stage 4 chronic kidney disease, or unspecified chronic kidney disease: Secondary | ICD-10-CM | POA: Diagnosis not present

## 2018-08-04 DIAGNOSIS — D631 Anemia in chronic kidney disease: Secondary | ICD-10-CM | POA: Diagnosis not present

## 2018-08-04 DIAGNOSIS — N183 Chronic kidney disease, stage 3 (moderate): Secondary | ICD-10-CM | POA: Diagnosis not present

## 2018-08-05 ENCOUNTER — Emergency Department (HOSPITAL_COMMUNITY)
Admission: EM | Admit: 2018-08-05 | Discharge: 2018-08-05 | Disposition: A | Discharge status: Home / Self Care | Payer: Medicare Other | Attending: Emergency Medicine | Admitting: Emergency Medicine

## 2018-08-05 ENCOUNTER — Encounter (HOSPITAL_COMMUNITY): Payer: Self-pay

## 2018-08-05 ENCOUNTER — Other Ambulatory Visit: Payer: Self-pay

## 2018-08-05 DIAGNOSIS — I950 Idiopathic hypotension: Secondary | ICD-10-CM | POA: Diagnosis present

## 2018-08-05 DIAGNOSIS — E11649 Type 2 diabetes mellitus with hypoglycemia without coma: Secondary | ICD-10-CM | POA: Insufficient documentation

## 2018-08-05 DIAGNOSIS — Z7901 Long term (current) use of anticoagulants: Secondary | ICD-10-CM | POA: Insufficient documentation

## 2018-08-05 DIAGNOSIS — I2510 Atherosclerotic heart disease of native coronary artery without angina pectoris: Secondary | ICD-10-CM | POA: Diagnosis not present

## 2018-08-05 DIAGNOSIS — Z794 Long term (current) use of insulin: Secondary | ICD-10-CM | POA: Insufficient documentation

## 2018-08-05 DIAGNOSIS — Z79899 Other long term (current) drug therapy: Secondary | ICD-10-CM | POA: Diagnosis not present

## 2018-08-05 DIAGNOSIS — Z87891 Personal history of nicotine dependence: Secondary | ICD-10-CM | POA: Diagnosis not present

## 2018-08-05 DIAGNOSIS — E162 Hypoglycemia, unspecified: Secondary | ICD-10-CM

## 2018-08-05 DIAGNOSIS — I5032 Chronic diastolic (congestive) heart failure: Secondary | ICD-10-CM | POA: Diagnosis not present

## 2018-08-05 DIAGNOSIS — J449 Chronic obstructive pulmonary disease, unspecified: Secondary | ICD-10-CM | POA: Diagnosis not present

## 2018-08-05 DIAGNOSIS — E559 Vitamin D deficiency, unspecified: Secondary | ICD-10-CM | POA: Diagnosis not present

## 2018-08-05 DIAGNOSIS — Z7982 Long term (current) use of aspirin: Secondary | ICD-10-CM | POA: Insufficient documentation

## 2018-08-05 DIAGNOSIS — N183 Chronic kidney disease, stage 3 (moderate): Secondary | ICD-10-CM | POA: Diagnosis not present

## 2018-08-05 DIAGNOSIS — E039 Hypothyroidism, unspecified: Secondary | ICD-10-CM | POA: Insufficient documentation

## 2018-08-05 DIAGNOSIS — Z7902 Long term (current) use of antithrombotics/antiplatelets: Secondary | ICD-10-CM | POA: Diagnosis not present

## 2018-08-05 DIAGNOSIS — D631 Anemia in chronic kidney disease: Secondary | ICD-10-CM | POA: Diagnosis not present

## 2018-08-05 DIAGNOSIS — E1122 Type 2 diabetes mellitus with diabetic chronic kidney disease: Secondary | ICD-10-CM | POA: Diagnosis not present

## 2018-08-05 DIAGNOSIS — I130 Hypertensive heart and chronic kidney disease with heart failure and stage 1 through stage 4 chronic kidney disease, or unspecified chronic kidney disease: Secondary | ICD-10-CM | POA: Diagnosis not present

## 2018-08-05 DIAGNOSIS — M25511 Pain in right shoulder: Secondary | ICD-10-CM | POA: Diagnosis not present

## 2018-08-05 DIAGNOSIS — J9611 Chronic respiratory failure with hypoxia: Secondary | ICD-10-CM | POA: Diagnosis not present

## 2018-08-05 DIAGNOSIS — Z9981 Dependence on supplemental oxygen: Secondary | ICD-10-CM | POA: Diagnosis not present

## 2018-08-05 DIAGNOSIS — M25562 Pain in left knee: Secondary | ICD-10-CM | POA: Diagnosis not present

## 2018-08-05 DIAGNOSIS — G8929 Other chronic pain: Secondary | ICD-10-CM | POA: Diagnosis not present

## 2018-08-05 DIAGNOSIS — I959 Hypotension, unspecified: Secondary | ICD-10-CM | POA: Diagnosis not present

## 2018-08-05 LAB — CBC
HCT: 28.9 % — ABNORMAL LOW (ref 36.0–46.0)
Hemoglobin: 8.2 g/dL — ABNORMAL LOW (ref 12.0–15.0)
MCH: 25.1 pg — ABNORMAL LOW (ref 26.0–34.0)
MCHC: 28.4 g/dL — ABNORMAL LOW (ref 30.0–36.0)
MCV: 88.4 fL (ref 80.0–100.0)
Platelets: 368 10*3/uL (ref 150–400)
RBC: 3.27 MIL/uL — ABNORMAL LOW (ref 3.87–5.11)
RDW: 15.5 % (ref 11.5–15.5)
WBC: 7.8 10*3/uL (ref 4.0–10.5)
nRBC: 0 % (ref 0.0–0.2)

## 2018-08-05 LAB — BASIC METABOLIC PANEL
Anion gap: 8 (ref 5–15)
BUN: 33 mg/dL — ABNORMAL HIGH (ref 8–23)
CO2: 24 mmol/L (ref 22–32)
Calcium: 8.5 mg/dL — ABNORMAL LOW (ref 8.9–10.3)
Chloride: 104 mmol/L (ref 98–111)
Creatinine, Ser: 1.77 mg/dL — ABNORMAL HIGH (ref 0.44–1.00)
GFR calc Af Amer: 34 mL/min — ABNORMAL LOW (ref >60)
GFR calc non Af Amer: 29 mL/min — ABNORMAL LOW (ref >60)
Glucose, Bld: 78 mg/dL (ref 70–99)
Potassium: 4.1 mmol/L (ref 3.5–5.1)
Sodium: 136 mmol/L (ref 135–145)

## 2018-08-05 LAB — CBG MONITORING, ED
Glucose-Capillary: 76 mg/dL (ref 70–99)
Glucose-Capillary: 62 mg/dL — ABNORMAL LOW (ref 70–99)
Glucose-Capillary: 101 mg/dL — ABNORMAL HIGH (ref 70–99)

## 2018-08-05 MED ORDER — ACETAMINOPHEN 325 MG PO TABS: 650.0000 mg | ORAL_TABLET | Freq: Once | ORAL | Status: DC

## 2018-08-05 MED ORDER — SODIUM CHLORIDE 0.9% FLUSH: 3.0000 mL | Freq: Once | INTRAVENOUS | Status: DC

## 2018-08-05 NOTE — ED Notes (Signed)
Stephanie Combs [phone] Call for updates and concerns please

## 2018-08-05 NOTE — Discharge Instructions (Addendum)
Make an appointment to follow-up with your regular doctor.  If you are able to check your blood sugar at home followed at home.  Return for any new or worse symptoms.  Blood pressure here fine blood sugar now up.

## 2018-08-05 NOTE — ED Notes (Signed)
Immediately upon arrival patient had to use restroom and urine was not collected.

## 2018-08-05 NOTE — ED Notes (Addendum)
Patient was able to stand and take a few steps.  Patient could ambulate if she would stop demanding warm blankets, coffee, and food.  Patient's o2 sat 100% on room air while standing and making all these demands.  Patient also is hard to arouse to follow commands.  She will go back to sleep while you are talking to her and attempting to get her to follow commands.

## 2018-08-05 NOTE — ED Triage Notes (Addendum)
Pt sent to ED for hypotension by Dr Merlene Laughter. Pt had an appointment this am and her bp was 86/38 and 84/42, and they also reported she was lethargic. Pt alert but drowsy in triage. Pt states she is just tired and not able to sleep. Pt states she has been dizzy also

## 2018-08-05 NOTE — ED Provider Notes (Signed)
[facility] EMERGENCY DEPARTMENT Provider Note   CSN: [id number] Arrival date & time: 08/05/18  1406    History   Chief Complaint Chief Complaint  Patient presents with  . Hypotension  . Dizziness    HPI Stephanie Combs is a 68 y.o. female with a history as outlined below most significant for CAD, CHF, COPD, diabetes and hypertension who was sent here from Dr. Freddie Apley office secondary to low blood pressures while in his office.  Her blood pressures obtained there were 86/38 and recheck 84/42.  They noted her to be lethargic during her visit there.  She does endorse having had no p.o. intake today in anticipation of her doctor's appointment.  She also states she has had no medications today, specifically has not taken her NovoLog.  She does appear fatigued during her initial evaluation here, but states she is just drowsy as she has had multiple personal emotional stressors and states she has been sleeping very poorly for the past week.  Endorse mild lightheadedness which is not triggered by movement or positional change.  She states she just feels weak and fatigued.  She denies focal weakness, denies headache, no nausea, vomiting, chest pain shortness of breath or abdominal pain.     The history is provided by the patient.    Past Medical History:  Diagnosis Date  . Anxiety   . CAD (coronary artery disease)    a. s/p DES to LAD and angioplasty to D1 in 05/2017  . CHF (congestive heart failure) (HCC)    Diastolic  . Chronic back pain   . Chronic pain   . COPD (chronic obstructive pulmonary disease) ([place])   . Degenerative disk disease   . Diabetes mellitus without complication ([place])   . History of medication noncompliance 11/2017   "the patient frequently self-adjusts medications without notifying her physicians"  . Hypertension   . Hypothyroidism   . On home O2    2.5 L N/C prn  . Pedal edema     Patient Active Problem List   Diagnosis Date Noted  . Chronic pain  07/05/2018  . On home O2 07/05/2018  . Opioid dependence ([place]) 07/05/2018  . CAD (coronary artery disease) 05/14/2018  . AKI (acute kidney injury) ([place]) 04/05/2018  . Cellulitis 01/10/2018  . Cellulitis of right lower extremity 01/07/2018  . Altered mental status 01/06/2018  . Type 2 diabetes mellitus with hypoglycemia without coma ([place]) 11/24/2017  . Anxiety 11/24/2017  . Chronic respiratory failure with hypoxia ([place]) 11/24/2017  . Syncope 10/24/2017  . Bradycardia   . Syncope due to orthostatic hypotension 09/04/2017  . Obesity, Class III, BMI 40-49.9 (morbid obesity) ([place]) 09/04/2017  . Abnormal nuclear stress test 05/19/2017  . Atypical angina ([place]) 05/19/2017  . Edema 05/15/2017  . Skin ulcer ([place]) 01/30/2017  . Tinea cruris 10/10/2016  . Vitamin D deficiency 08/19/2016  . Personal history of noncompliance with medical treatment, presenting hazards to health 06/17/2016  . CAP (community acquired pneumonia) 07/25/2015  . Pressure ulcer 05/02/2015  . Hyponatremia 05/01/2015  . Acute-on-chronic kidney injury ([place]) 05/01/2015  . Uncontrolled type 2 diabetes mellitus with stage 3 chronic kidney disease ([place]) 05/01/2015  . Cellulitis of foot, right 03/24/2015  . Peripheral edema 12/04/2014  . Acute renal failure superimposed on stage 3 chronic kidney disease ([place]) 11/24/2014  . Bilateral edema of lower extremity   . Diabetic ulcer of right great toe ([place])   . Foot ulcer due to secondary DM ([place]) 11/07/2014  .  Diabetic ulcer of right foot ([place]) 11/07/2014  . Edema of lower extremity 05/27/2013  . CKD (chronic kidney disease), stage III ([place]) 04/14/2013  . Anemia in CKD (chronic kidney disease) 04/14/2013  . Chest pain 04/14/2013  . Dyspnea 04/14/2013  . Chronic diastolic CHF (congestive heart failure) ([place])   . Hypothyroidism   . COPD (chronic obstructive pulmonary disease) ([place]) 08/07/2012  . Morbid obesity ([place]) 08/07/2012  . DM type 2 (diabetes mellitus, type 2) ([place])  08/07/2012  . HTN (hypertension), benign 08/07/2012    Past Surgical History:  Procedure Laterality Date  . CORONARY STENT INTERVENTION N/A 05/22/2017   Procedure: CORONARY STENT INTERVENTION;  Surgeon: Martinique, Peter M, MD;  Location: [facility];  Service: Cardiovascular;  Laterality: N/A;  . HERNIA REPAIR    . RIGHT/LEFT HEART CATH AND CORONARY ANGIOGRAPHY N/A 05/19/2017   Procedure: RIGHT/LEFT HEART CATH AND CORONARY ANGIOGRAPHY;  Surgeon: Leonie Man, MD;  Location: [facility];  Service: Cardiovascular;  Laterality: N/A;  . TUBAL LIGATION       OB History   No obstetric history on file.      Home Medications    Prior to Admission medications   Medication Sig Start Date End Date Taking? Authorizing Provider  albuterol (PROAIR HFA) 108 (90 Base) MCG/ACT inhaler Inhale 2 puffs into the lungs every 4 (four) hours as needed. For shortness of breath 04/09/18   Wynetta Emery, Clanford L, MD  albuterol (PROVENTIL) (2.5 MG/3ML) 0.083% nebulizer solution Take 2.5 mg by nebulization 2 (two) times daily as needed for wheezing or shortness of breath.     [provider]  ALPRAZolam Duanne Moron) 1 MG tablet Take 1 mg by mouth 3 (three) times daily as needed for anxiety or sleep.  01/05/17   [provider]  aspirin EC 81 MG tablet Take 81 mg by mouth daily.    [provider]  atorvastatin (LIPITOR) 80 MG tablet Take 1 tablet (80 mg total) by mouth daily at 6 PM. Patient taking differently: Take 80 mg by mouth every morning.  05/26/17   [name], MD  Cholecalciferol (VITAMIN D3) 5000 units CAPS Take 1 capsule (5,000 Units total) by mouth daily. 08/19/16   Cassandria Anger, MD  clopidogrel (PLAVIX) 75 MG tablet Take 1 tablet (75 mg total) by mouth daily. 05/26/17   [name], MD  docusate sodium (COLACE) 100 MG capsule Take 300 mg by mouth daily.    [provider]  ferrous sulfate 325 (65 FE) MG tablet Take 1 tablet (325 mg total) by mouth daily  with breakfast. 02/05/17   [name], MD  furosemide (LASIX) 20 MG tablet Take 40mg  po in the morning and 20mg  in the evening 07/08/18   Kathie Dike, MD  gabapentin (NEURONTIN) 600 MG tablet Take 600 mg by mouth 4 (four) times daily.    [provider]  Hydrocortisone (GERHARDT'S BUTT CREAM) CREA Apply 1 application topically 3 (three) times daily. 05/26/17   [name], MD  insulin aspart (NOVOLOG) 100 UNIT/ML injection Inject 1-18 Units into the skin 3 (three) times daily with meals. 04/09/18   Johnson, Clanford L, MD  insulin detemir (LEVEMIR) 100 UNIT/ML injection Inject 0.4 mLs (40 Units total) into the skin at bedtime. 11/26/17   Orson Eva, MD  levothyroxine (SYNTHROID, LEVOTHROID) 200 MCG tablet Take 1 tablet by mouth daily. 06/24/18   [provider]  nitroGLYCERIN (NITROSTAT) 0.4 MG SL tablet Place 0.4 mg under the tongue every 5 (five)  minutes as needed for chest pain.  03/26/18   [provider]  nystatin (MYCOSTATIN/NYSTOP) powder Apply topically 2 (two) times daily. 10/10/16   Eber Jones, MD  Omega-3 Fatty Acids (FISH OIL PO) Take 1 capsule by mouth daily.     [provider]  ondansetron (ZOFRAN-ODT) 8 MG disintegrating tablet Take 8 mg by mouth every 8 (eight) hours as needed for nausea or vomiting.  03/15/18   [provider]  oxyCODONE-acetaminophen (PERCOCET) 10-325 MG tablet Take 1 tablet by mouth every 6 (six) hours as needed for pain.    [provider]  OXYGEN Inhale 2 L into the lungs continuous.    [provider]  pantoprazole (PROTONIX) 40 MG tablet Take 1 tablet (40 mg total) by mouth 2 (two) times daily. 04/09/18   Johnson, Clanford L, MD  potassium chloride (MICRO-K) 10 MEQ CR capsule Take 2 capsules (20 mEq total) by mouth 2 (two) times daily. *May take one additional tablet as needed for cramping 04/23/18   Herminio Commons, MD  SURE COMFORT INS SYR 1CC/28G 28G X 1/2" 1 ML MISC USE TO INJECT  INSULIN UP TO 4 TIMES DAILY. 03/06/17   Cassandria Anger, MD  tiZANidine (ZANAFLEX) 4 MG tablet Take 1 tablet (4 mg total) by mouth every 8 (eight) hours as needed for muscle spasms. 07/08/18   Kathie Dike, MD  topiramate (TOPAMAX) 50 MG tablet Take 150 mg by mouth every evening.  03/18/18   [provider]  umeclidinium-vilanterol (ANORO ELLIPTA) 62.5-25 MCG/INH AEPB Inhale 1 puff into the lungs daily. Patient taking differently: Inhale 1 puff into the lungs daily as needed (for shortness of breath).  05/26/17   [name], MD  VICTOZA 18 MG/3ML SOPN Inject 1.2 mg into the skin daily.  01/01/17   [provider]    Family History Family History  Problem Relation Age of Onset  . Stroke Mother   . Heart attack Father   . Diabetes Brother   . Cerebral palsy Brother   . Pneumonia Brother   . Diabetes Other   . Heart attack Other     Social History Social History   Tobacco Use  . Smoking status: Former Smoker    Packs/day: 0.25    Types: Cigarettes    Last attempt to quit: 11/24/2014    Years since quitting: 3.6  . Smokeless tobacco: Never Used  Substance Use Topics  . Alcohol use: No    Alcohol/week: 0.0 standard drinks  . Drug use: No     Allergies   Dilaudid [hydromorphone hcl]; Midodrine hcl; Actifed cold-allergy [chlorpheniramine-phenylephrine]; Doxycycline; Other; Penicillins; Reglan [metoclopramide]; Valium [diazepam]; and Vistaril [hydroxyzine hcl]   Review of Systems Review of Systems  Constitutional: Positive for fatigue. Negative for fever.  HENT: Negative.   Eyes: Negative.   Respiratory: Negative for chest tightness and shortness of breath.   Cardiovascular: Negative for chest pain.  Gastrointestinal: Negative for abdominal pain, nausea and vomiting.  Genitourinary: Negative.   Musculoskeletal: Negative for arthralgias, joint swelling and neck pain.  Skin: Negative.  Negative for rash and wound.  Neurological: Positive for  light-headedness. Negative for dizziness, weakness, numbness and headaches.  Psychiatric/Behavioral: Positive for sleep disturbance.     Physical Exam Updated Vital Signs BP (!) 113/50 (BP Location: Left Arm)   Pulse 70   Temp 98 F (36.7 C) (Oral)   Resp 18   Ht 5\' 5"  (1.651 m)   Wt (!) 141.1 kg   SpO2  100%   BMI 51.75 kg/m   Physical Exam Vitals signs and nursing note reviewed.  Constitutional:      Appearance: She is well-developed.     Comments: Morbidly obese.  HENT:     Head: Normocephalic and atraumatic.  Eyes:     Conjunctiva/sclera: Conjunctivae normal.  Neck:     Musculoskeletal: Normal range of motion.  Cardiovascular:     Rate and Rhythm: Normal rate and regular rhythm.     Heart sounds: Normal heart sounds.  Pulmonary:     Effort: Pulmonary effort is normal.     Breath sounds: Normal breath sounds. No wheezing.  Abdominal:     General: Bowel sounds are normal. There is distension.     Palpations: Abdomen is soft. There is no mass.     Tenderness: There is no abdominal tenderness. There is no guarding.     Comments: Difficult exam secondary to body habitus.  Large pannus.  Musculoskeletal: Normal range of motion.        General: Swelling present.     Comments: Bilateral lower extremity edema.  Skin:    General: Skin is warm and dry.  Neurological:     Mental Status: She is alert and oriented to person, place, and time.     Comments: No focal weakness.  Patient does appear fatigued.  She is conversive.      ED Treatments / Results  Labs (all labs ordered are listed, but only abnormal results are displayed) Labs Reviewed  BASIC METABOLIC PANEL - Abnormal; Notable for the following components:      Result Value   BUN 33 (*)    Creatinine, Ser 1.77 (*)    Calcium 8.5 (*)    GFR calc non Af Amer 29 (*)    GFR calc Af Amer 34 (*)    All other components within normal limits  CBC - Abnormal; Notable for the following components:   RBC 3.27 (*)     Hemoglobin 8.2 (*)    HCT 28.9 (*)    MCH 25.1 (*)    MCHC 28.4 (*)    All other components within normal limits  CBG MONITORING, ED - Abnormal; Notable for the following components:   Glucose-Capillary 62 (*)    All other components within normal limits  URINALYSIS, ROUTINE W REFLEX MICROSCOPIC  CBG MONITORING, ED    EKG EKG Interpretation  Date/Time:  Thursday August 05 2018 14:48:33 EDT Ventricular Rate:  88 PR Interval:    QRS Duration: 96 QT Interval:  381 QTC Calculation: 461 R Axis:   71 Text Interpretation:  Unknown rhythm, irregular rate Low voltage, precordial leads Confirmed by Julianne Rice [phone]) on 08/05/2018 2:55:01 PM   Radiology No results found.  Procedures Procedures (including critical care time)  Medications Ordered in ED Medications  sodium chloride flush (NS) 0.9 % injection 3 mL (has no administration in time range)  acetaminophen (TYLENOL) tablet 650 mg (has no administration in time range)     Initial Impression / Assessment and Plan / ED Course  I have reviewed the triage vital signs and the nursing notes.  Pertinent labs & imaging results that were available during my care of the patient were reviewed by me and considered in my medical decision making (see chart for details).        Labs reviewed and discussed with pt.  Prior to dc, pt seemed a little slow to respond, still conversing but less animated than on initial presentation.  She added that at her neurologists office, they checked her blood glucose level and it was 33.  However, there was no documentation of this finding on the paperwork sent from her provider, just low bp's as concern.  She states she stopped and ate cinnamon rolls from [employer] prior to arriving here.  Recheck cbg resulting 62.  Ordered snack and regular soda or juice.     Discussed with Dr. Rogene Houston who will continue to follow patient.  May be able to dc home with stabilized glucose.  Final Clinical  Impressions(s) / ED Diagnoses   Final diagnoses:  Hypoglycemia    ED Discharge Orders    None       Landis Martins 08/05/18 Domenica Fail, MD 08/05/18 2013

## 2018-08-05 NOTE — ED Notes (Signed)
Per P.A. patient was given peanut butter and crackers due to BGL 62

## 2018-08-10 DIAGNOSIS — Z7902 Long term (current) use of antithrombotics/antiplatelets: Secondary | ICD-10-CM | POA: Diagnosis not present

## 2018-08-10 DIAGNOSIS — E559 Vitamin D deficiency, unspecified: Secondary | ICD-10-CM | POA: Diagnosis not present

## 2018-08-10 DIAGNOSIS — J449 Chronic obstructive pulmonary disease, unspecified: Secondary | ICD-10-CM | POA: Diagnosis not present

## 2018-08-10 DIAGNOSIS — E1122 Type 2 diabetes mellitus with diabetic chronic kidney disease: Secondary | ICD-10-CM | POA: Diagnosis not present

## 2018-08-10 DIAGNOSIS — I2510 Atherosclerotic heart disease of native coronary artery without angina pectoris: Secondary | ICD-10-CM | POA: Diagnosis not present

## 2018-08-10 DIAGNOSIS — J9611 Chronic respiratory failure with hypoxia: Secondary | ICD-10-CM | POA: Diagnosis not present

## 2018-08-10 DIAGNOSIS — D631 Anemia in chronic kidney disease: Secondary | ICD-10-CM | POA: Diagnosis not present

## 2018-08-10 DIAGNOSIS — N183 Chronic kidney disease, stage 3 (moderate): Secondary | ICD-10-CM | POA: Diagnosis not present

## 2018-08-10 DIAGNOSIS — Z794 Long term (current) use of insulin: Secondary | ICD-10-CM | POA: Diagnosis not present

## 2018-08-10 DIAGNOSIS — I130 Hypertensive heart and chronic kidney disease with heart failure and stage 1 through stage 4 chronic kidney disease, or unspecified chronic kidney disease: Secondary | ICD-10-CM | POA: Diagnosis not present

## 2018-08-10 DIAGNOSIS — Z9981 Dependence on supplemental oxygen: Secondary | ICD-10-CM | POA: Diagnosis not present

## 2018-08-10 DIAGNOSIS — I5032 Chronic diastolic (congestive) heart failure: Secondary | ICD-10-CM | POA: Diagnosis not present

## 2018-08-10 DIAGNOSIS — E039 Hypothyroidism, unspecified: Secondary | ICD-10-CM | POA: Diagnosis not present

## 2018-08-10 DIAGNOSIS — G8929 Other chronic pain: Secondary | ICD-10-CM | POA: Diagnosis not present

## 2018-08-10 DIAGNOSIS — Z7982 Long term (current) use of aspirin: Secondary | ICD-10-CM | POA: Diagnosis not present

## 2018-08-12 ENCOUNTER — Other Ambulatory Visit: Payer: Self-pay

## 2018-08-12 DIAGNOSIS — Z9981 Dependence on supplemental oxygen: Secondary | ICD-10-CM | POA: Diagnosis not present

## 2018-08-12 DIAGNOSIS — I2510 Atherosclerotic heart disease of native coronary artery without angina pectoris: Secondary | ICD-10-CM | POA: Diagnosis not present

## 2018-08-12 DIAGNOSIS — E559 Vitamin D deficiency, unspecified: Secondary | ICD-10-CM | POA: Diagnosis not present

## 2018-08-12 DIAGNOSIS — J449 Chronic obstructive pulmonary disease, unspecified: Secondary | ICD-10-CM | POA: Diagnosis not present

## 2018-08-12 DIAGNOSIS — N183 Chronic kidney disease, stage 3 (moderate): Secondary | ICD-10-CM | POA: Diagnosis not present

## 2018-08-12 DIAGNOSIS — I130 Hypertensive heart and chronic kidney disease with heart failure and stage 1 through stage 4 chronic kidney disease, or unspecified chronic kidney disease: Secondary | ICD-10-CM | POA: Diagnosis not present

## 2018-08-12 DIAGNOSIS — D631 Anemia in chronic kidney disease: Secondary | ICD-10-CM | POA: Diagnosis not present

## 2018-08-12 DIAGNOSIS — E039 Hypothyroidism, unspecified: Secondary | ICD-10-CM | POA: Diagnosis not present

## 2018-08-12 DIAGNOSIS — G8929 Other chronic pain: Secondary | ICD-10-CM | POA: Diagnosis not present

## 2018-08-12 DIAGNOSIS — Z7982 Long term (current) use of aspirin: Secondary | ICD-10-CM | POA: Diagnosis not present

## 2018-08-12 DIAGNOSIS — Z794 Long term (current) use of insulin: Secondary | ICD-10-CM | POA: Diagnosis not present

## 2018-08-12 DIAGNOSIS — J9611 Chronic respiratory failure with hypoxia: Secondary | ICD-10-CM | POA: Diagnosis not present

## 2018-08-12 DIAGNOSIS — I5032 Chronic diastolic (congestive) heart failure: Secondary | ICD-10-CM | POA: Diagnosis not present

## 2018-08-12 DIAGNOSIS — E1122 Type 2 diabetes mellitus with diabetic chronic kidney disease: Secondary | ICD-10-CM | POA: Diagnosis not present

## 2018-08-12 DIAGNOSIS — Z7902 Long term (current) use of antithrombotics/antiplatelets: Secondary | ICD-10-CM | POA: Diagnosis not present

## 2018-08-12 NOTE — Patient Outreach (Signed)
[facility]) Care Management  08/12/2018  Stephanie Combs Feb 19, 1951 [id number]   Telephone Screen  Referral Date: 08/12/2018 Referral Source: [facility] Dept. Referral Reason: " [facility] member pays for abdominal pas out of pocket through [place] to prevent yeast under abdomen, has not been through [employer] for coverage" Insurance: [employer]   Outreach attempt # 1 to patient. Spoke with patient and discussed referral source and reason. Patient reports that she is getting supplies now for about $80/month. She wants to know if she can have assistance to get supplies at no cost. Advised patient that [facility] does not assist with this and these items are normally covered by Medicaid. Patient reports she lost her Medicaid coverage a few months ago due to her income increased. Suggested to patient to contact [facility] customer service for catalog to see if supplies covered in benefit. She voiced that she would do so. Patient reports that she is currently getting [facility] services for treatment of cellulitis to legs. RN CM also encouraged patient to discuss supplies with [facility] and to see if she is able to get supplies through agency. She will follow up. She denies any further RN CM or [facility] needs or concerns at this time and did not wish to complete screening assessment.      Plan: RN CM will close case at this time as no further interventions needed.    Enzo Montgomery, RN,BSN,CCM [facility] Telephonic Care Management Coordinator Direct Phone: [phone] Toll Free: [phone] Fax: [phone]

## 2018-08-13 DIAGNOSIS — E1165 Type 2 diabetes mellitus with hyperglycemia: Secondary | ICD-10-CM | POA: Diagnosis not present

## 2018-08-14 DIAGNOSIS — J449 Chronic obstructive pulmonary disease, unspecified: Secondary | ICD-10-CM | POA: Diagnosis not present

## 2018-08-17 DIAGNOSIS — G8929 Other chronic pain: Secondary | ICD-10-CM | POA: Diagnosis not present

## 2018-08-17 DIAGNOSIS — J9611 Chronic respiratory failure with hypoxia: Secondary | ICD-10-CM | POA: Diagnosis not present

## 2018-08-17 DIAGNOSIS — J449 Chronic obstructive pulmonary disease, unspecified: Secondary | ICD-10-CM | POA: Diagnosis not present

## 2018-08-17 DIAGNOSIS — I130 Hypertensive heart and chronic kidney disease with heart failure and stage 1 through stage 4 chronic kidney disease, or unspecified chronic kidney disease: Secondary | ICD-10-CM | POA: Diagnosis not present

## 2018-08-17 DIAGNOSIS — I5032 Chronic diastolic (congestive) heart failure: Secondary | ICD-10-CM | POA: Diagnosis not present

## 2018-08-17 DIAGNOSIS — Z7902 Long term (current) use of antithrombotics/antiplatelets: Secondary | ICD-10-CM | POA: Diagnosis not present

## 2018-08-17 DIAGNOSIS — E559 Vitamin D deficiency, unspecified: Secondary | ICD-10-CM | POA: Diagnosis not present

## 2018-08-17 DIAGNOSIS — Z7982 Long term (current) use of aspirin: Secondary | ICD-10-CM | POA: Diagnosis not present

## 2018-08-17 DIAGNOSIS — D631 Anemia in chronic kidney disease: Secondary | ICD-10-CM | POA: Diagnosis not present

## 2018-08-17 DIAGNOSIS — E039 Hypothyroidism, unspecified: Secondary | ICD-10-CM | POA: Diagnosis not present

## 2018-08-17 DIAGNOSIS — E1122 Type 2 diabetes mellitus with diabetic chronic kidney disease: Secondary | ICD-10-CM | POA: Diagnosis not present

## 2018-08-17 DIAGNOSIS — N183 Chronic kidney disease, stage 3 (moderate): Secondary | ICD-10-CM | POA: Diagnosis not present

## 2018-08-17 DIAGNOSIS — Z9981 Dependence on supplemental oxygen: Secondary | ICD-10-CM | POA: Diagnosis not present

## 2018-08-17 DIAGNOSIS — I2510 Atherosclerotic heart disease of native coronary artery without angina pectoris: Secondary | ICD-10-CM | POA: Diagnosis not present

## 2018-08-17 DIAGNOSIS — Z794 Long term (current) use of insulin: Secondary | ICD-10-CM | POA: Diagnosis not present

## 2018-08-18 DIAGNOSIS — I5032 Chronic diastolic (congestive) heart failure: Secondary | ICD-10-CM | POA: Diagnosis not present

## 2018-08-18 DIAGNOSIS — D631 Anemia in chronic kidney disease: Secondary | ICD-10-CM | POA: Diagnosis not present

## 2018-08-18 DIAGNOSIS — Z9981 Dependence on supplemental oxygen: Secondary | ICD-10-CM | POA: Diagnosis not present

## 2018-08-18 DIAGNOSIS — J449 Chronic obstructive pulmonary disease, unspecified: Secondary | ICD-10-CM | POA: Diagnosis not present

## 2018-08-18 DIAGNOSIS — I2510 Atherosclerotic heart disease of native coronary artery without angina pectoris: Secondary | ICD-10-CM | POA: Diagnosis not present

## 2018-08-18 DIAGNOSIS — Z794 Long term (current) use of insulin: Secondary | ICD-10-CM | POA: Diagnosis not present

## 2018-08-18 DIAGNOSIS — I130 Hypertensive heart and chronic kidney disease with heart failure and stage 1 through stage 4 chronic kidney disease, or unspecified chronic kidney disease: Secondary | ICD-10-CM | POA: Diagnosis not present

## 2018-08-18 DIAGNOSIS — J9611 Chronic respiratory failure with hypoxia: Secondary | ICD-10-CM | POA: Diagnosis not present

## 2018-08-18 DIAGNOSIS — Z7902 Long term (current) use of antithrombotics/antiplatelets: Secondary | ICD-10-CM | POA: Diagnosis not present

## 2018-08-18 DIAGNOSIS — E1122 Type 2 diabetes mellitus with diabetic chronic kidney disease: Secondary | ICD-10-CM | POA: Diagnosis not present

## 2018-08-18 DIAGNOSIS — Z7982 Long term (current) use of aspirin: Secondary | ICD-10-CM | POA: Diagnosis not present

## 2018-08-18 DIAGNOSIS — E039 Hypothyroidism, unspecified: Secondary | ICD-10-CM | POA: Diagnosis not present

## 2018-08-18 DIAGNOSIS — N183 Chronic kidney disease, stage 3 (moderate): Secondary | ICD-10-CM | POA: Diagnosis not present

## 2018-08-18 DIAGNOSIS — G8929 Other chronic pain: Secondary | ICD-10-CM | POA: Diagnosis not present

## 2018-08-18 DIAGNOSIS — E559 Vitamin D deficiency, unspecified: Secondary | ICD-10-CM | POA: Diagnosis not present

## 2018-08-18 IMAGING — CT CT HEAD W/O CM
4 series · 17 of 47 positions shown, 19 images · non-contrast
Comparison: CT head dated September 02, 2017.

CLINICAL DATA: Syncope and fall.

EXAM:
CT HEAD WITHOUT CONTRAST
TECHNIQUE: Contiguous axial images were obtained from the base of the skull
through the vertex without intravenous contrast.

[Series 2: head trauma wo · axial · 0.45mm/px · z∈[+1544,+1664]mm · 7 of 32 slices shown, 9 images]
[im 4/32  brain]
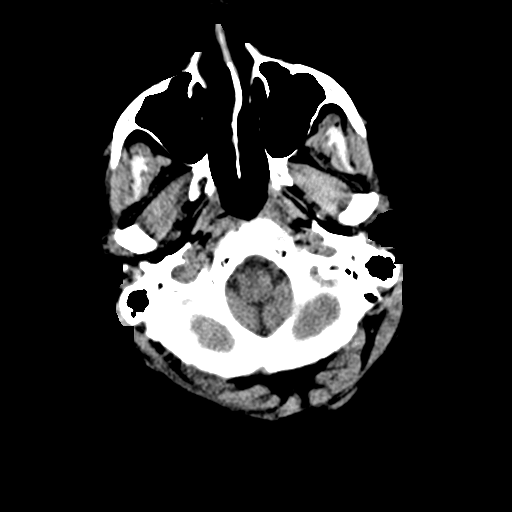
[im 4/32  bone]
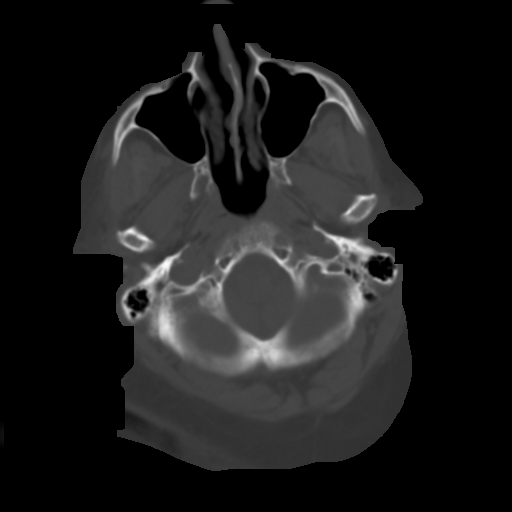
[im 8/32  brain]
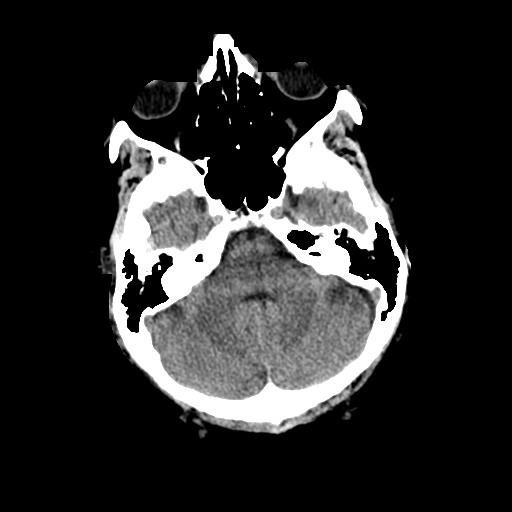
[im 12/32  brain]
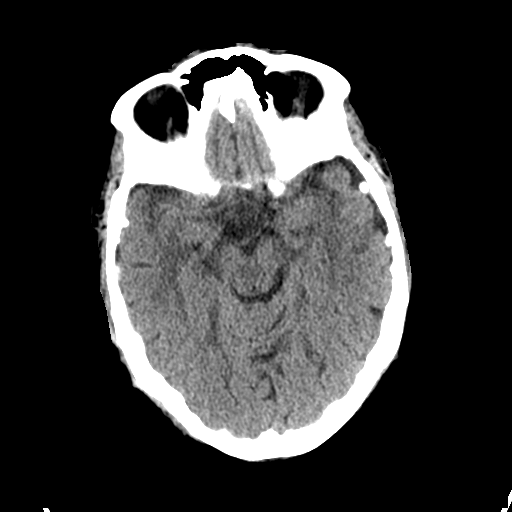
[im 16/32  brain]
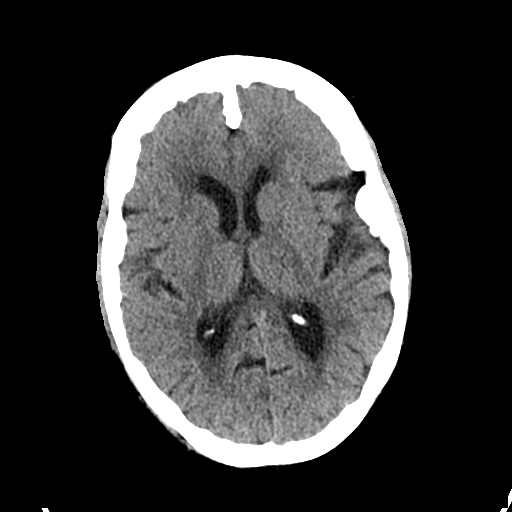
[im 20/32  brain]
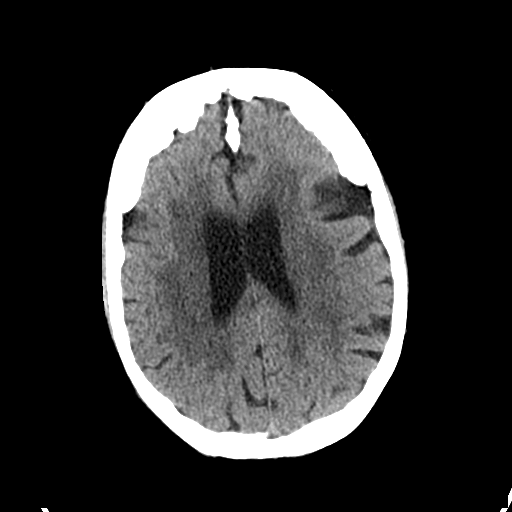
[im 20/32  bone]
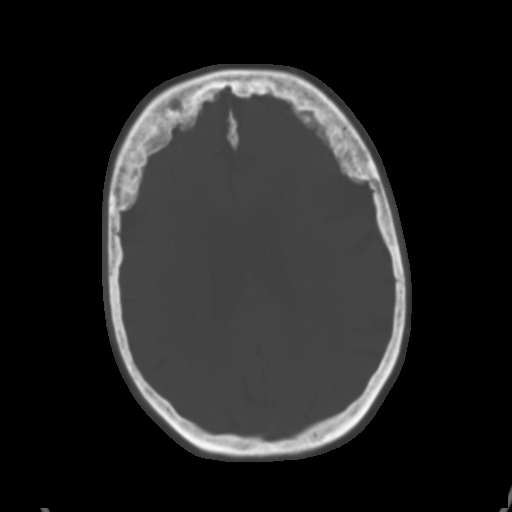
[im 24/32  brain]
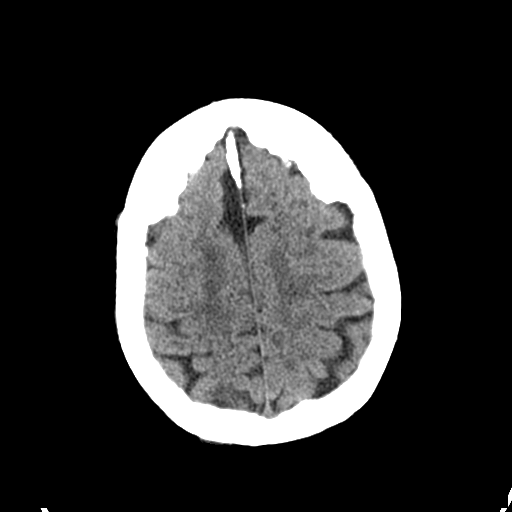
[im 28/32  brain]
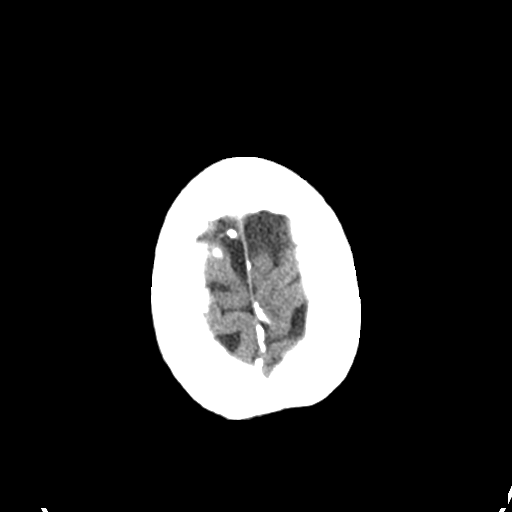

[Series 3: head bone · axial · 0.45mm/px · z∈[+1543,+1599]mm · 4 of 80 slices shown]
[im 8/80  bone]
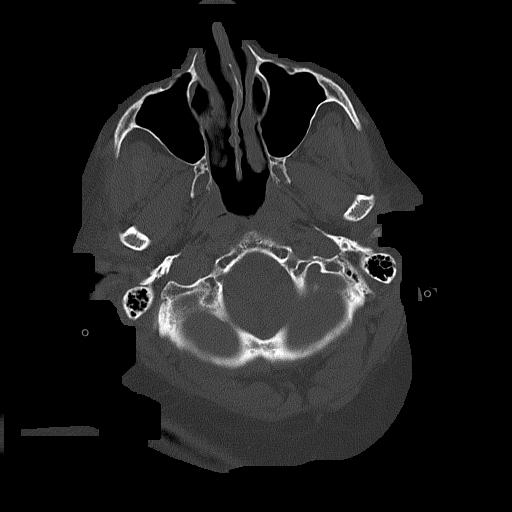
[im 16/80  bone]
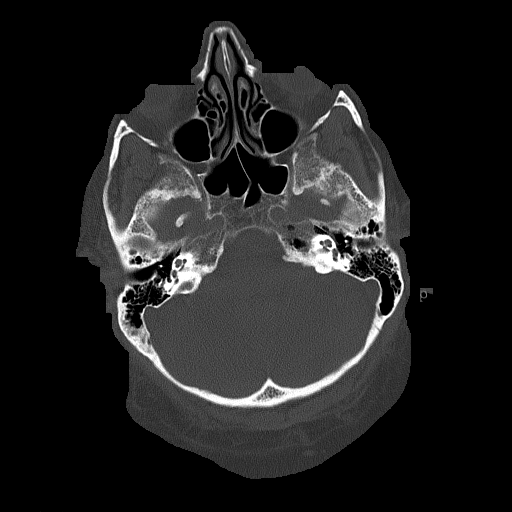
[im 24/80  bone]
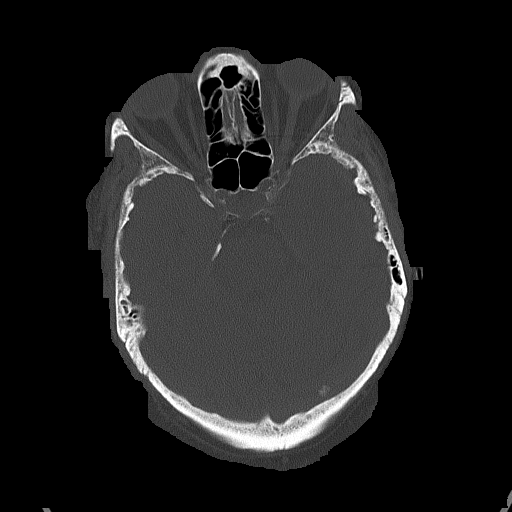
[im 36/80  bone]
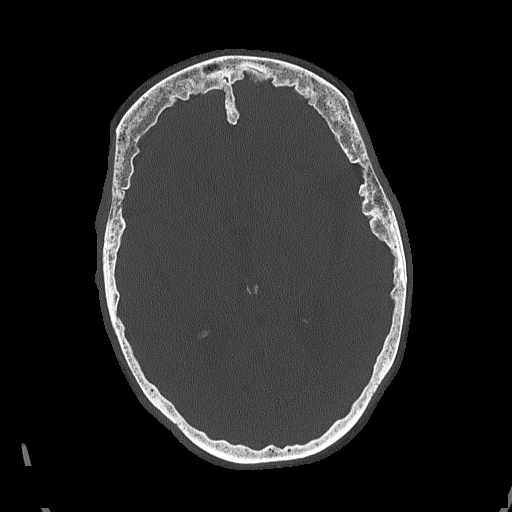

[Series 4: coronal soft tissue · coronal · 0.35mm/px · 3 of 79 slices shown]
[im 27/79  brain]
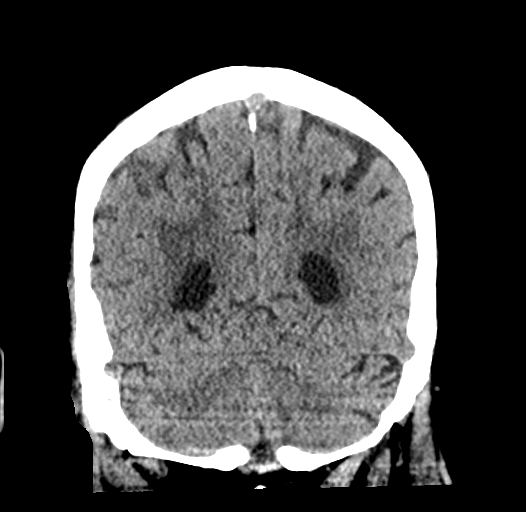
[im 35/79  brain]
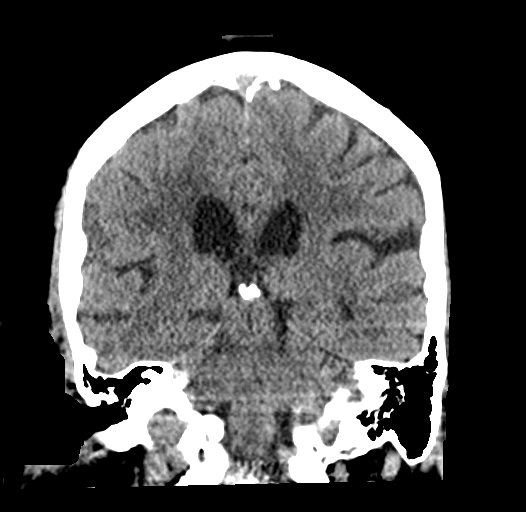
[im 44/79  brain]
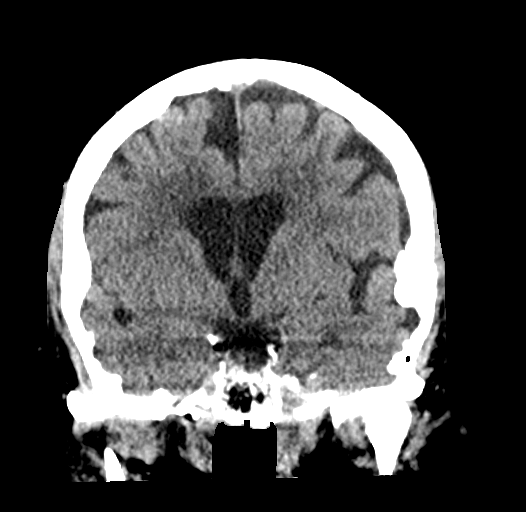

[Series 5: sagittal soft tissue · sagittal · 0.35mm/px · 3 of 63 slices shown]
[im 21/63  brain]
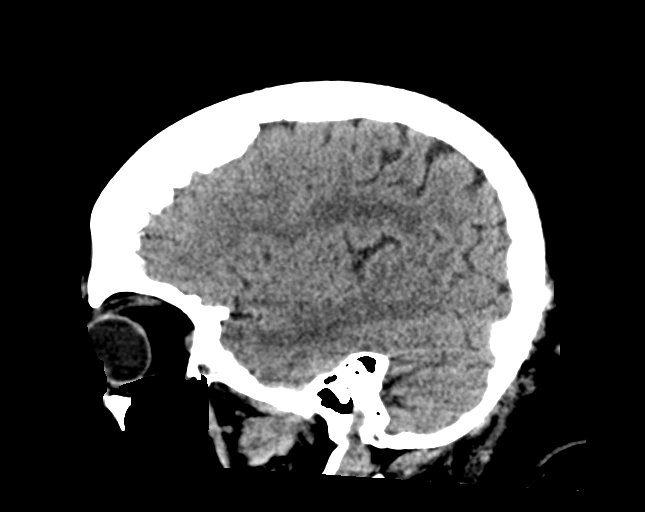
[im 32/63  brain]
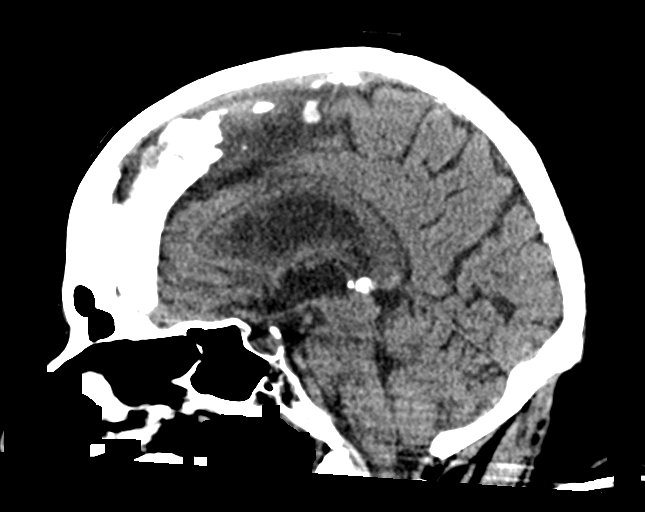
[im 42/63  brain]
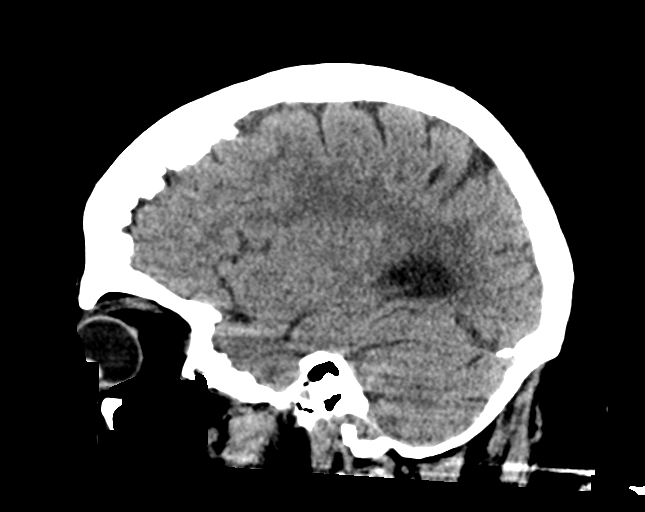

[17 of 47 positions shown; findings below may reference images not displayed]

FINDINGS: Brain: No evidence of acute infarction, hemorrhage, hydrocephalus,
extra-axial collection or mass lesion/mass effect. Stable atrophy
and chronic microvascular ischemic changes.

Vascular: No hyperdense vessel or unexpected calcification.

Skull: Negative for fracture or focal lesion.

Sinuses/Orbits: No acute finding.

Other: None.
IMPRESSION: 1.  No acute intracranial abnormality.

## 2018-08-19 DIAGNOSIS — I2510 Atherosclerotic heart disease of native coronary artery without angina pectoris: Secondary | ICD-10-CM | POA: Diagnosis not present

## 2018-08-19 DIAGNOSIS — Z9981 Dependence on supplemental oxygen: Secondary | ICD-10-CM | POA: Diagnosis not present

## 2018-08-19 DIAGNOSIS — I130 Hypertensive heart and chronic kidney disease with heart failure and stage 1 through stage 4 chronic kidney disease, or unspecified chronic kidney disease: Secondary | ICD-10-CM | POA: Diagnosis not present

## 2018-08-19 DIAGNOSIS — Z794 Long term (current) use of insulin: Secondary | ICD-10-CM | POA: Diagnosis not present

## 2018-08-19 DIAGNOSIS — J9611 Chronic respiratory failure with hypoxia: Secondary | ICD-10-CM | POA: Diagnosis not present

## 2018-08-19 DIAGNOSIS — I5032 Chronic diastolic (congestive) heart failure: Secondary | ICD-10-CM | POA: Diagnosis not present

## 2018-08-19 DIAGNOSIS — G8929 Other chronic pain: Secondary | ICD-10-CM | POA: Diagnosis not present

## 2018-08-19 DIAGNOSIS — J449 Chronic obstructive pulmonary disease, unspecified: Secondary | ICD-10-CM | POA: Diagnosis not present

## 2018-08-19 DIAGNOSIS — E1122 Type 2 diabetes mellitus with diabetic chronic kidney disease: Secondary | ICD-10-CM | POA: Diagnosis not present

## 2018-08-19 DIAGNOSIS — E559 Vitamin D deficiency, unspecified: Secondary | ICD-10-CM | POA: Diagnosis not present

## 2018-08-19 DIAGNOSIS — D631 Anemia in chronic kidney disease: Secondary | ICD-10-CM | POA: Diagnosis not present

## 2018-08-19 DIAGNOSIS — N183 Chronic kidney disease, stage 3 (moderate): Secondary | ICD-10-CM | POA: Diagnosis not present

## 2018-08-19 DIAGNOSIS — Z7982 Long term (current) use of aspirin: Secondary | ICD-10-CM | POA: Diagnosis not present

## 2018-08-19 DIAGNOSIS — Z7902 Long term (current) use of antithrombotics/antiplatelets: Secondary | ICD-10-CM | POA: Diagnosis not present

## 2018-08-19 DIAGNOSIS — E039 Hypothyroidism, unspecified: Secondary | ICD-10-CM | POA: Diagnosis not present

## 2018-08-20 DIAGNOSIS — E1122 Type 2 diabetes mellitus with diabetic chronic kidney disease: Secondary | ICD-10-CM | POA: Diagnosis not present

## 2018-08-20 DIAGNOSIS — D631 Anemia in chronic kidney disease: Secondary | ICD-10-CM | POA: Diagnosis not present

## 2018-08-20 DIAGNOSIS — N183 Chronic kidney disease, stage 3 (moderate): Secondary | ICD-10-CM | POA: Diagnosis not present

## 2018-08-20 DIAGNOSIS — Z7982 Long term (current) use of aspirin: Secondary | ICD-10-CM | POA: Diagnosis not present

## 2018-08-20 DIAGNOSIS — I130 Hypertensive heart and chronic kidney disease with heart failure and stage 1 through stage 4 chronic kidney disease, or unspecified chronic kidney disease: Secondary | ICD-10-CM | POA: Diagnosis not present

## 2018-08-20 DIAGNOSIS — Z7902 Long term (current) use of antithrombotics/antiplatelets: Secondary | ICD-10-CM | POA: Diagnosis not present

## 2018-08-20 DIAGNOSIS — E039 Hypothyroidism, unspecified: Secondary | ICD-10-CM | POA: Diagnosis not present

## 2018-08-20 DIAGNOSIS — J9611 Chronic respiratory failure with hypoxia: Secondary | ICD-10-CM | POA: Diagnosis not present

## 2018-08-20 DIAGNOSIS — Z9981 Dependence on supplemental oxygen: Secondary | ICD-10-CM | POA: Diagnosis not present

## 2018-08-20 DIAGNOSIS — E559 Vitamin D deficiency, unspecified: Secondary | ICD-10-CM | POA: Diagnosis not present

## 2018-08-20 DIAGNOSIS — G8929 Other chronic pain: Secondary | ICD-10-CM | POA: Diagnosis not present

## 2018-08-20 DIAGNOSIS — J449 Chronic obstructive pulmonary disease, unspecified: Secondary | ICD-10-CM | POA: Diagnosis not present

## 2018-08-20 DIAGNOSIS — Z794 Long term (current) use of insulin: Secondary | ICD-10-CM | POA: Diagnosis not present

## 2018-08-20 DIAGNOSIS — I5032 Chronic diastolic (congestive) heart failure: Secondary | ICD-10-CM | POA: Diagnosis not present

## 2018-08-20 DIAGNOSIS — I2510 Atherosclerotic heart disease of native coronary artery without angina pectoris: Secondary | ICD-10-CM | POA: Diagnosis not present

## 2018-08-23 DIAGNOSIS — J449 Chronic obstructive pulmonary disease, unspecified: Secondary | ICD-10-CM | POA: Diagnosis not present

## 2018-08-23 DIAGNOSIS — I2510 Atherosclerotic heart disease of native coronary artery without angina pectoris: Secondary | ICD-10-CM | POA: Diagnosis not present

## 2018-08-23 DIAGNOSIS — N183 Chronic kidney disease, stage 3 (moderate): Secondary | ICD-10-CM | POA: Diagnosis not present

## 2018-08-23 DIAGNOSIS — Z7902 Long term (current) use of antithrombotics/antiplatelets: Secondary | ICD-10-CM | POA: Diagnosis not present

## 2018-08-23 DIAGNOSIS — G8929 Other chronic pain: Secondary | ICD-10-CM | POA: Diagnosis not present

## 2018-08-23 DIAGNOSIS — I130 Hypertensive heart and chronic kidney disease with heart failure and stage 1 through stage 4 chronic kidney disease, or unspecified chronic kidney disease: Secondary | ICD-10-CM | POA: Diagnosis not present

## 2018-08-23 DIAGNOSIS — I5032 Chronic diastolic (congestive) heart failure: Secondary | ICD-10-CM | POA: Diagnosis not present

## 2018-08-23 DIAGNOSIS — J9611 Chronic respiratory failure with hypoxia: Secondary | ICD-10-CM | POA: Diagnosis not present

## 2018-08-23 DIAGNOSIS — E039 Hypothyroidism, unspecified: Secondary | ICD-10-CM | POA: Diagnosis not present

## 2018-08-23 DIAGNOSIS — Z9981 Dependence on supplemental oxygen: Secondary | ICD-10-CM | POA: Diagnosis not present

## 2018-08-23 DIAGNOSIS — E559 Vitamin D deficiency, unspecified: Secondary | ICD-10-CM | POA: Diagnosis not present

## 2018-08-23 DIAGNOSIS — Z7982 Long term (current) use of aspirin: Secondary | ICD-10-CM | POA: Diagnosis not present

## 2018-08-23 DIAGNOSIS — Z794 Long term (current) use of insulin: Secondary | ICD-10-CM | POA: Diagnosis not present

## 2018-08-23 DIAGNOSIS — D631 Anemia in chronic kidney disease: Secondary | ICD-10-CM | POA: Diagnosis not present

## 2018-08-23 DIAGNOSIS — E1122 Type 2 diabetes mellitus with diabetic chronic kidney disease: Secondary | ICD-10-CM | POA: Diagnosis not present

## 2018-08-25 ENCOUNTER — Encounter: Payer: Self-pay | Admitting: Student

## 2018-08-25 ENCOUNTER — Telehealth (INDEPENDENT_AMBULATORY_CARE_PROVIDER_SITE_OTHER): Payer: Medicare Other | Admitting: Student

## 2018-08-25 VITALS — BP 110/62 | HR 70 | Wt 305.0 lb

## 2018-08-25 DIAGNOSIS — N183 Chronic kidney disease, stage 3 unspecified: Secondary | ICD-10-CM

## 2018-08-25 DIAGNOSIS — E039 Hypothyroidism, unspecified: Secondary | ICD-10-CM | POA: Diagnosis not present

## 2018-08-25 DIAGNOSIS — G8929 Other chronic pain: Secondary | ICD-10-CM | POA: Diagnosis not present

## 2018-08-25 DIAGNOSIS — Z7982 Long term (current) use of aspirin: Secondary | ICD-10-CM | POA: Diagnosis not present

## 2018-08-25 DIAGNOSIS — J449 Chronic obstructive pulmonary disease, unspecified: Secondary | ICD-10-CM | POA: Diagnosis not present

## 2018-08-25 DIAGNOSIS — I5032 Chronic diastolic (congestive) heart failure: Secondary | ICD-10-CM

## 2018-08-25 DIAGNOSIS — Z794 Long term (current) use of insulin: Secondary | ICD-10-CM | POA: Diagnosis not present

## 2018-08-25 DIAGNOSIS — Z7189 Other specified counseling: Secondary | ICD-10-CM

## 2018-08-25 DIAGNOSIS — I130 Hypertensive heart and chronic kidney disease with heart failure and stage 1 through stage 4 chronic kidney disease, or unspecified chronic kidney disease: Secondary | ICD-10-CM | POA: Diagnosis not present

## 2018-08-25 DIAGNOSIS — I25118 Atherosclerotic heart disease of native coronary artery with other forms of angina pectoris: Secondary | ICD-10-CM

## 2018-08-25 DIAGNOSIS — E1122 Type 2 diabetes mellitus with diabetic chronic kidney disease: Secondary | ICD-10-CM | POA: Diagnosis not present

## 2018-08-25 DIAGNOSIS — E785 Hyperlipidemia, unspecified: Secondary | ICD-10-CM

## 2018-08-25 DIAGNOSIS — D631 Anemia in chronic kidney disease: Secondary | ICD-10-CM | POA: Diagnosis not present

## 2018-08-25 DIAGNOSIS — Z7902 Long term (current) use of antithrombotics/antiplatelets: Secondary | ICD-10-CM | POA: Diagnosis not present

## 2018-08-25 DIAGNOSIS — E559 Vitamin D deficiency, unspecified: Secondary | ICD-10-CM | POA: Diagnosis not present

## 2018-08-25 DIAGNOSIS — Z9981 Dependence on supplemental oxygen: Secondary | ICD-10-CM | POA: Diagnosis not present

## 2018-08-25 DIAGNOSIS — J9611 Chronic respiratory failure with hypoxia: Secondary | ICD-10-CM | POA: Diagnosis not present

## 2018-08-25 DIAGNOSIS — I2510 Atherosclerotic heart disease of native coronary artery without angina pectoris: Secondary | ICD-10-CM | POA: Diagnosis not present

## 2018-08-25 MED ORDER — FUROSEMIDE 20 MG PO TABS: ORAL_TABLET | ORAL | 3 refills | Status: DC

## 2018-08-25 MED ORDER — PANTOPRAZOLE SODIUM 40 MG PO TBEC: 40.0000 mg | DELAYED_RELEASE_TABLET | Freq: Two times a day (BID) | ORAL | 3 refills | Status: DC

## 2018-08-25 NOTE — Patient Instructions (Addendum)
Medication Instructions:  Your physician recommends that you continue on your current medications as directed. Please refer to the Current Medication list given to you today.  Stop Taking Plavix   Labwork: NONE  Testing/Procedures: NONE  Follow-Up: Your physician wants you to follow-up in: 6 Months with Dr. [name]. You will receive a reminder letter in the mail two months in advance. If you don't receive a letter, please call our office to schedule the follow-up appointment.   Any Other Special Instructions Will Be Listed Below (If Applicable).     If you need a refill on your cardiac medications before your next appointment, please call your pharmacy. Thank you for choosing [facility]!

## 2018-08-25 NOTE — Progress Notes (Signed)
Virtual Visit via Telephone Note   This visit type was conducted due to national recommendations for restrictions regarding the COVID-19 Pandemic (e.g. social distancing) in an effort to limit this patient's exposure and mitigate transmission in our community.  Due to her co-morbid illnesses, this patient is at least at moderate risk for complications without adequate follow up.  This format is felt to be most appropriate for this patient at this time.  The patient did not have access to video technology/had technical difficulties with video requiring transitioning to audio format only (telephone).  All issues noted in this document were discussed and addressed.  No physical exam could be performed with this format.  Please refer to the patient's chart for her  consent to telehealth for [facility].   Evaluation Performed:  Follow-up visit  Date:  08/25/2018   ID:  Stephanie Combs, DOB 10/08/1950, MRN [id number]  Patient Location: Home Provider Location: Home  PCP:  [name], MD  Cardiologist:  Kate Sable, MD  Electrophysiologist:  None   Chief Complaint:  29-month F/U  History of Present Illness:    Stephanie Combs is a 68 y.o. female with past medical history of CAD (s/p DES to [place] angioplasty to [place] 05/2017), chronic diastolic CHF, HTN, HLD,IDDM,COPD, Hypothyroidism, Stage 3 CKD, and recurrent syncopal events.   She was last examined by myself in 12/2017 following a recent admission for syncope which was thought to be secondary to her significant hypothyroidism as TSH was elevated to 199. She did report frequent urination and there was concern for dehydration, therefore Lasix was decreased from 20mg  BID to 20mg  daily given that weight had been stable on her home scales. Most recent admission in the interim was in 06/2018 for chest pain and thought to be most consistent with pleuritic discomfort as she ruled out for ACS and EKG showed no acute ischemic changes.   In  talking with the patient today, she reports having intermittent dyspnea but this typically occurs when she is "taking a break" from her supplemental oxygen during the day. Denies any specific orthopnea, PND, or lower extremity edema. Says she always feels bloated in her abdomen. She has been taking Lasix 40mg  in AM/20mg  in PM with weight overall being stable on her home scales (between 305 - 310 lbs).   Says she has been having a constant discomfort across her entire chest for the past few months, since her most recent hospitalization. Worse with deep breathing and food consumption. No association with exertion. Does not feel like her prior anginal symptoms. Has been without her Protonix for several days and only taking once daily instead of as prescribed at BID dosing due to running out of the medication.   The patient does not have symptoms concerning for COVID-19 infection (fever, chills, cough, or new shortness of breath).    Past Medical History:  Diagnosis Date  . Anxiety   . CAD (coronary artery disease)    a. s/p DES to LAD and angioplasty to D1 in 05/2017  . CHF (congestive heart failure) (HCC)    Diastolic  . Chronic back pain   . Chronic pain   . COPD (chronic obstructive pulmonary disease) ([place])   . Degenerative disk disease   . Diabetes mellitus without complication ([place])   . History of medication noncompliance 11/2017   "the patient frequently self-adjusts medications without notifying her physicians"  . Hypertension   . Hypothyroidism   . On home O2    2.5  L N/C prn  . Pedal edema    Past Surgical History:  Procedure Laterality Date  . CORONARY STENT INTERVENTION N/A 05/22/2017   Procedure: CORONARY STENT INTERVENTION;  Surgeon: Martinique, Peter M, MD;  Location: [place] [facility];  Service: Cardiovascular;  Laterality: N/A;  . HERNIA REPAIR    . RIGHT/LEFT HEART CATH AND CORONARY ANGIOGRAPHY N/A 05/19/2017   Procedure: RIGHT/LEFT HEART CATH AND CORONARY ANGIOGRAPHY;   Surgeon: Leonie Man, MD;  Location: [place] [facility];  Service: Cardiovascular;  Laterality: N/A;  . TUBAL LIGATION       Current Meds  Medication Sig  . albuterol (PROAIR HFA) 108 (90 Base) MCG/ACT inhaler Inhale 2 puffs into the lungs every 4 (four) hours as needed. For shortness of breath  . albuterol (PROVENTIL) (2.5 MG/3ML) 0.083% nebulizer solution Take 2.5 mg by nebulization 2 (two) times daily as needed for wheezing or shortness of breath.   . ALPRAZolam (XANAX) 1 MG tablet Take 1 mg by mouth 3 (three) times daily as needed for anxiety or sleep.   Marland Kitchen aspirin EC 81 MG tablet Take 81 mg by mouth daily.  Marland Kitchen atorvastatin (LIPITOR) 80 MG tablet Take 1 tablet (80 mg total) by mouth daily at 6 PM. (Patient taking differently: Take 80 mg by mouth every morning. )  . Cholecalciferol (VITAMIN D3) 5000 units CAPS Take 1 capsule (5,000 Units total) by mouth daily.  Marland Kitchen docusate sodium (COLACE) 100 MG capsule Take 300 mg by mouth daily.  . ferrous sulfate 325 (65 FE) MG tablet Take 1 tablet (325 mg total) by mouth daily with breakfast.  . furosemide (LASIX) 20 MG tablet Take 40mg  po in the morning and 20mg  in the evening  . gabapentin (NEURONTIN) 600 MG tablet Take 600 mg by mouth 4 (four) times daily.  . Hydrocortisone (GERHARDT'S BUTT CREAM) CREA Apply 1 application topically 3 (three) times daily.  . insulin aspart (NOVOLOG) 100 UNIT/ML injection Inject 1-18 Units into the skin 3 (three) times daily with meals.  . insulin detemir (LEVEMIR) 100 UNIT/ML injection Inject 0.4 mLs (40 Units total) into the skin at bedtime.  Marland Kitchen levothyroxine (SYNTHROID, LEVOTHROID) 200 MCG tablet Take 1 tablet by mouth daily.  . nitroGLYCERIN (NITROSTAT) 0.4 MG SL tablet Place 0.4 mg under the tongue every 5 (five) minutes as needed for chest pain.   Marland Kitchen nystatin (MYCOSTATIN/NYSTOP) powder Apply topically 2 (two) times daily.  . Omega-3 Fatty Acids (FISH OIL PO) Take 1 capsule by mouth daily.   . ondansetron  (ZOFRAN-ODT) 8 MG disintegrating tablet Take 8 mg by mouth every 8 (eight) hours as needed for nausea or vomiting.   Marland Kitchen oxyCODONE-acetaminophen (PERCOCET) 10-325 MG tablet Take 1 tablet by mouth every 6 (six) hours as needed for pain.  . OXYGEN Inhale 2 L into the lungs continuous.  . pantoprazole (PROTONIX) 40 MG tablet Take 1 tablet (40 mg total) by mouth 2 (two) times daily.  . potassium chloride (MICRO-K) 10 MEQ CR capsule Take 2 capsules (20 mEq total) by mouth 2 (two) times daily. *May take one additional tablet as needed for cramping  . SURE COMFORT INS SYR 1CC/28G 28G X 1/2" 1 ML MISC USE TO INJECT INSULIN UP TO 4 TIMES DAILY.  Marland Kitchen tiZANidine (ZANAFLEX) 4 MG tablet Take 1 tablet (4 mg total) by mouth every 8 (eight) hours as needed for muscle spasms.  Marland Kitchen topiramate (TOPAMAX) 50 MG tablet Take 150 mg by mouth every evening.   . umeclidinium-vilanterol (ANORO ELLIPTA) 62.5-25  MCG/INH AEPB Inhale 1 puff into the lungs daily. (Patient taking differently: Inhale 1 puff into the lungs daily as needed (for shortness of breath). )  . VICTOZA 18 MG/3ML SOPN Inject 1.2 mg into the skin daily.   . [DISCONTINUED] clopidogrel (PLAVIX) 75 MG tablet Take 1 tablet (75 mg total) by mouth daily.  . [DISCONTINUED] furosemide (LASIX) 20 MG tablet Take 40mg  po in the morning and 20mg  in the evening  . [DISCONTINUED] pantoprazole (PROTONIX) 40 MG tablet Take 1 tablet (40 mg total) by mouth 2 (two) times daily.     Allergies:   Dilaudid [hydromorphone hcl]; Midodrine hcl; Actifed cold-allergy [chlorpheniramine-phenylephrine]; Doxycycline; Other; Penicillins; Reglan [metoclopramide]; Valium [diazepam]; and Vistaril [hydroxyzine hcl]   Social History   Tobacco Use  . Smoking status: Former Smoker    Packs/day: 0.25    Types: Cigarettes    Last attempt to quit: 11/24/2014    Years since quitting: 3.7  . Smokeless tobacco: Never Used  Substance Use Topics  . Alcohol use: No    Alcohol/week: 0.0 standard drinks   . Drug use: No     Family Hx: The patient's family history includes Cerebral palsy in her brother; Diabetes in her brother and another family member; Heart attack in her father and another family member; Pneumonia in her brother; Stroke in her mother.  ROS:   Please see the history of present illness.     All other systems reviewed and are negative.   Prior CV studies:   The following studies were reviewed today:  Echocardiogram: 07/05/2018 IMPRESSIONS   1. The left ventricle has normal systolic function with an ejection fraction of 60-65%. The cavity size was normal. There is moderately increased left ventricular wall thickness. Left ventricular diastolic Doppler parameters are consistent with impaired  relaxation. No evidence of left ventricular regional wall motion abnormalities.  2. The right ventricle has normal systolic function. The cavity was normal. There is no increase in right ventricular wall thickness. Right ventricular systolic pressure is moderately elevated with an estimated pressure of 46.8 mmHg.  3. The mitral valve is normal in structure. There is mild mitral annular calcification present.  4. The tricuspid valve is normal in structure.  5. The aortic valve was not well visualized.  6. No evidence of left ventricular regional wall motion abnormalities.   Labs/Other Tests and Data Reviewed:    EKG:  No ECG reviewed.  Recent Labs: 04/05/2018: Magnesium 2.2; TSH 11.607 07/05/2018: B Natriuretic Peptide 229.0 07/06/2018: ALT 8 08/05/2018: BUN 33; Creatinine, Ser 1.77; Hemoglobin 8.2; Platelets 368; Potassium 4.1; Sodium 136   Recent Lipid Panel Lab Results  Component Value Date/Time   CHOL 90 07/06/2018 05:45 AM   TRIG 90 07/06/2018 05:45 AM   HDL 35 (L) 07/06/2018 05:45 AM   CHOLHDL 2.6 07/06/2018 05:45 AM   LDLCALC 37 07/06/2018 05:45 AM    Wt Readings from Last 3 Encounters:  08/25/18 (!) 305 lb (138.3 kg)  08/05/18 (!) 311 lb (141.1 kg)  07/08/18  289 lb 6.4 oz (131.3 kg)     Objective:    Vital Signs:  BP 110/62   Pulse 70   Wt (!) 305 lb (138.3 kg)   BMI 50.75 kg/m    General: Pleasant female sounding in NAD Psych: Normal affect. Neuro: Alert and oriented X 3. Moves all extremities spontaneously. Lungs:  Respirations do not sound labored while talking on the phone.   ASSESSMENT & PLAN:    1. CAD - she is  s/p DES to [place] angioplasty to [place] 05/2017. Denies any exertional chest pain but does describe a constant discomfort along her entire precordium which has been persistent for months. Evaluated during recent admission and she ruled out for ACS. Symptoms seem atypical for angina. Will update Rx for PPI as she has only been taking this once daily instead of her prescribed BID dosing. - continue ASA. She has been on both ASA and Plavix since stent placement last year and I verified with Dr. [name] that she can discontinue Plavix at this time. Continue statin therapy. Previously developed significant bradycardia with BB therapy.   2. Chronic Diastolic CHF - she has baseline dyspnea on exertion in the setting of COPD but denies any recent change in her symptoms. Weight has been stable on her home scales. Will continue with Lasix 40mg  in AM/20mg  in PM. Sodium and fluid restriction reviewed with the patient.   3. HLD - followed by PCP. FLP in 06/2018 showed total cholesterol of 90, HDL 35, and LDL 37. Remains on Atorvastatin 80mg  daily.   4. Hypothyroidism - followed by PCP. TSH previously elevated to 199 in 10/2017, improved to 11.6 by most recent labs in [place]. She reports this had returned to normal when most recently checked by her PCP. Remains on Synthroid.   5. Stage 3 CKD - baseline creatinine 1.5 - 1.7. Stable at 1.77 by most recent labs in 07/2018.  6. COVID-19 Education: The signs and symptoms of COVID-19 were discussed with the patient. The importance of social distancing was discussed today.  Time:   Today,  I have spent 29 minutes with the patient with telehealth technology discussing the above problems.     Medication Adjustments/Labs and Tests Ordered: Current medicines are reviewed at length with the patient today.  Concerns regarding medicines are outlined above.   Tests Ordered: No orders of the defined types were placed in this encounter.   Medication Changes: Meds ordered this encounter  Medications  . pantoprazole (PROTONIX) 40 MG tablet    Sig: Take 1 tablet (40 mg total) by mouth 2 (two) times daily.    Dispense:  180 tablet    Refill:  3    Order Specific Question:   Supervising Provider    Answer:   Dorothy Spark [id number]  . furosemide (LASIX) 20 MG tablet    Sig: Take 40mg  po in the morning and 20mg  in the evening    Dispense:  90 tablet    Refill:  3    Order Specific Question:   Supervising Provider    Answer:   Dorothy Spark [[id number]]    Disposition:  Follow up with Dr. [name] in 6 months.   Signed, Erma Heritage, PA-C  08/25/2018 6:55 PM    [facility]

## 2018-08-27 DIAGNOSIS — Z9981 Dependence on supplemental oxygen: Secondary | ICD-10-CM | POA: Diagnosis not present

## 2018-08-27 DIAGNOSIS — E559 Vitamin D deficiency, unspecified: Secondary | ICD-10-CM | POA: Diagnosis not present

## 2018-08-27 DIAGNOSIS — N183 Chronic kidney disease, stage 3 (moderate): Secondary | ICD-10-CM | POA: Diagnosis not present

## 2018-08-27 DIAGNOSIS — Z794 Long term (current) use of insulin: Secondary | ICD-10-CM | POA: Diagnosis not present

## 2018-08-27 DIAGNOSIS — I130 Hypertensive heart and chronic kidney disease with heart failure and stage 1 through stage 4 chronic kidney disease, or unspecified chronic kidney disease: Secondary | ICD-10-CM | POA: Diagnosis not present

## 2018-08-27 DIAGNOSIS — J449 Chronic obstructive pulmonary disease, unspecified: Secondary | ICD-10-CM | POA: Diagnosis not present

## 2018-08-27 DIAGNOSIS — E039 Hypothyroidism, unspecified: Secondary | ICD-10-CM | POA: Diagnosis not present

## 2018-08-27 DIAGNOSIS — I5032 Chronic diastolic (congestive) heart failure: Secondary | ICD-10-CM | POA: Diagnosis not present

## 2018-08-27 DIAGNOSIS — J9611 Chronic respiratory failure with hypoxia: Secondary | ICD-10-CM | POA: Diagnosis not present

## 2018-08-27 DIAGNOSIS — Z7982 Long term (current) use of aspirin: Secondary | ICD-10-CM | POA: Diagnosis not present

## 2018-08-27 DIAGNOSIS — I2510 Atherosclerotic heart disease of native coronary artery without angina pectoris: Secondary | ICD-10-CM | POA: Diagnosis not present

## 2018-08-27 DIAGNOSIS — G8929 Other chronic pain: Secondary | ICD-10-CM | POA: Diagnosis not present

## 2018-08-27 DIAGNOSIS — D631 Anemia in chronic kidney disease: Secondary | ICD-10-CM | POA: Diagnosis not present

## 2018-08-27 DIAGNOSIS — E1122 Type 2 diabetes mellitus with diabetic chronic kidney disease: Secondary | ICD-10-CM | POA: Diagnosis not present

## 2018-08-27 DIAGNOSIS — Z7902 Long term (current) use of antithrombotics/antiplatelets: Secondary | ICD-10-CM | POA: Diagnosis not present

## 2018-08-30 ENCOUNTER — Emergency Department (HOSPITAL_COMMUNITY): Payer: Medicare Other

## 2018-08-30 ENCOUNTER — Inpatient Hospital Stay (HOSPITAL_COMMUNITY)
Admission: EM | Admit: 2018-08-30 | Discharge: 2018-09-22 | DRG: 166 | Disposition: A | Discharge status: Home Health | Payer: Medicare Other | Attending: Internal Medicine | Admitting: Internal Medicine

## 2018-08-30 ENCOUNTER — Encounter (HOSPITAL_COMMUNITY): Payer: Self-pay

## 2018-08-30 ENCOUNTER — Other Ambulatory Visit: Payer: Self-pay

## 2018-08-30 DIAGNOSIS — Z833 Family history of diabetes mellitus: Secondary | ICD-10-CM

## 2018-08-30 DIAGNOSIS — K6389 Other specified diseases of intestine: Secondary | ICD-10-CM | POA: Diagnosis not present

## 2018-08-30 DIAGNOSIS — R112 Nausea with vomiting, unspecified: Secondary | ICD-10-CM | POA: Diagnosis not present

## 2018-08-30 DIAGNOSIS — I130 Hypertensive heart and chronic kidney disease with heart failure and stage 1 through stage 4 chronic kidney disease, or unspecified chronic kidney disease: Secondary | ICD-10-CM | POA: Diagnosis not present

## 2018-08-30 DIAGNOSIS — I2510 Atherosclerotic heart disease of native coronary artery without angina pectoris: Secondary | ICD-10-CM | POA: Diagnosis not present

## 2018-08-30 DIAGNOSIS — E1140 Type 2 diabetes mellitus with diabetic neuropathy, unspecified: Secondary | ICD-10-CM | POA: Diagnosis not present

## 2018-08-30 DIAGNOSIS — N183 Chronic kidney disease, stage 3 unspecified: Secondary | ICD-10-CM | POA: Diagnosis present

## 2018-08-30 DIAGNOSIS — M549 Dorsalgia, unspecified: Secondary | ICD-10-CM | POA: Diagnosis not present

## 2018-08-30 DIAGNOSIS — J449 Chronic obstructive pulmonary disease, unspecified: Secondary | ICD-10-CM | POA: Diagnosis not present

## 2018-08-30 DIAGNOSIS — I110 Hypertensive heart disease with heart failure: Secondary | ICD-10-CM | POA: Diagnosis not present

## 2018-08-30 DIAGNOSIS — M546 Pain in thoracic spine: Secondary | ICD-10-CM | POA: Diagnosis not present

## 2018-08-30 DIAGNOSIS — Z7982 Long term (current) use of aspirin: Secondary | ICD-10-CM

## 2018-08-30 DIAGNOSIS — D120 Benign neoplasm of cecum: Secondary | ICD-10-CM | POA: Diagnosis not present

## 2018-08-30 DIAGNOSIS — C182 Malignant neoplasm of ascending colon: Secondary | ICD-10-CM | POA: Diagnosis present

## 2018-08-30 DIAGNOSIS — Z79899 Other long term (current) drug therapy: Secondary | ICD-10-CM | POA: Diagnosis not present

## 2018-08-30 DIAGNOSIS — R195 Other fecal abnormalities: Secondary | ICD-10-CM | POA: Diagnosis not present

## 2018-08-30 DIAGNOSIS — Z87891 Personal history of nicotine dependence: Secondary | ICD-10-CM

## 2018-08-30 DIAGNOSIS — E119 Type 2 diabetes mellitus without complications: Secondary | ICD-10-CM

## 2018-08-30 DIAGNOSIS — I951 Orthostatic hypotension: Secondary | ICD-10-CM | POA: Diagnosis not present

## 2018-08-30 DIAGNOSIS — IMO0002 Reserved for concepts with insufficient information to code with codable children: Secondary | ICD-10-CM | POA: Diagnosis present

## 2018-08-30 DIAGNOSIS — Z880 Allergy status to penicillin: Secondary | ICD-10-CM

## 2018-08-30 DIAGNOSIS — E039 Hypothyroidism, unspecified: Secondary | ICD-10-CM | POA: Diagnosis not present

## 2018-08-30 DIAGNOSIS — Z515 Encounter for palliative care: Secondary | ICD-10-CM | POA: Diagnosis not present

## 2018-08-30 DIAGNOSIS — N182 Chronic kidney disease, stage 2 (mild): Secondary | ICD-10-CM

## 2018-08-30 DIAGNOSIS — C3412 Malignant neoplasm of upper lobe, left bronchus or lung: Secondary | ICD-10-CM | POA: Diagnosis not present

## 2018-08-30 DIAGNOSIS — R1110 Vomiting, unspecified: Secondary | ICD-10-CM | POA: Diagnosis not present

## 2018-08-30 DIAGNOSIS — Z794 Long term (current) use of insulin: Secondary | ICD-10-CM

## 2018-08-30 DIAGNOSIS — R0602 Shortness of breath: Secondary | ICD-10-CM

## 2018-08-30 DIAGNOSIS — K219 Gastro-esophageal reflux disease without esophagitis: Secondary | ICD-10-CM | POA: Diagnosis present

## 2018-08-30 DIAGNOSIS — Z8249 Family history of ischemic heart disease and other diseases of the circulatory system: Secondary | ICD-10-CM

## 2018-08-30 DIAGNOSIS — D128 Benign neoplasm of rectum: Secondary | ICD-10-CM | POA: Diagnosis not present

## 2018-08-30 DIAGNOSIS — I890 Lymphedema, not elsewhere classified: Secondary | ICD-10-CM | POA: Diagnosis present

## 2018-08-30 DIAGNOSIS — R918 Other nonspecific abnormal finding of lung field: Secondary | ICD-10-CM | POA: Diagnosis not present

## 2018-08-30 DIAGNOSIS — D631 Anemia in chronic kidney disease: Secondary | ICD-10-CM | POA: Diagnosis not present

## 2018-08-30 DIAGNOSIS — R82993 Hyperuricosuria: Secondary | ICD-10-CM | POA: Diagnosis not present

## 2018-08-30 DIAGNOSIS — R069 Unspecified abnormalities of breathing: Secondary | ICD-10-CM | POA: Diagnosis not present

## 2018-08-30 DIAGNOSIS — Z66 Do not resuscitate: Secondary | ICD-10-CM | POA: Diagnosis present

## 2018-08-30 DIAGNOSIS — L03116 Cellulitis of left lower limb: Secondary | ICD-10-CM | POA: Clinically undetermined

## 2018-08-30 DIAGNOSIS — C189 Malignant neoplasm of colon, unspecified: Secondary | ICD-10-CM | POA: Diagnosis not present

## 2018-08-30 DIAGNOSIS — G901 Familial dysautonomia [Riley-Day]: Secondary | ICD-10-CM | POA: Diagnosis present

## 2018-08-30 DIAGNOSIS — M79606 Pain in leg, unspecified: Secondary | ICD-10-CM

## 2018-08-30 DIAGNOSIS — G8929 Other chronic pain: Secondary | ICD-10-CM | POA: Diagnosis present

## 2018-08-30 DIAGNOSIS — I5033 Acute on chronic diastolic (congestive) heart failure: Secondary | ICD-10-CM | POA: Diagnosis present

## 2018-08-30 DIAGNOSIS — I5032 Chronic diastolic (congestive) heart failure: Secondary | ICD-10-CM | POA: Diagnosis not present

## 2018-08-30 DIAGNOSIS — E538 Deficiency of other specified B group vitamins: Secondary | ICD-10-CM | POA: Diagnosis present

## 2018-08-30 DIAGNOSIS — N179 Acute kidney failure, unspecified: Secondary | ICD-10-CM | POA: Diagnosis not present

## 2018-08-30 DIAGNOSIS — D124 Benign neoplasm of descending colon: Secondary | ICD-10-CM | POA: Diagnosis not present

## 2018-08-30 DIAGNOSIS — J90 Pleural effusion, not elsewhere classified: Secondary | ICD-10-CM | POA: Diagnosis not present

## 2018-08-30 DIAGNOSIS — C19 Malignant neoplasm of rectosigmoid junction: Secondary | ICD-10-CM | POA: Diagnosis not present

## 2018-08-30 DIAGNOSIS — I10 Essential (primary) hypertension: Secondary | ICD-10-CM | POA: Diagnosis not present

## 2018-08-30 DIAGNOSIS — I872 Venous insufficiency (chronic) (peripheral): Secondary | ICD-10-CM | POA: Diagnosis present

## 2018-08-30 DIAGNOSIS — E1165 Type 2 diabetes mellitus with hyperglycemia: Secondary | ICD-10-CM | POA: Diagnosis present

## 2018-08-30 DIAGNOSIS — J9611 Chronic respiratory failure with hypoxia: Secondary | ICD-10-CM | POA: Diagnosis not present

## 2018-08-30 DIAGNOSIS — R05 Cough: Secondary | ICD-10-CM

## 2018-08-30 DIAGNOSIS — M069 Rheumatoid arthritis, unspecified: Secondary | ICD-10-CM | POA: Diagnosis not present

## 2018-08-30 DIAGNOSIS — D509 Iron deficiency anemia, unspecified: Secondary | ICD-10-CM | POA: Diagnosis not present

## 2018-08-30 DIAGNOSIS — D649 Anemia, unspecified: Secondary | ICD-10-CM | POA: Diagnosis not present

## 2018-08-30 DIAGNOSIS — D508 Other iron deficiency anemias: Secondary | ICD-10-CM | POA: Diagnosis not present

## 2018-08-30 DIAGNOSIS — E1142 Type 2 diabetes mellitus with diabetic polyneuropathy: Secondary | ICD-10-CM | POA: Diagnosis present

## 2018-08-30 DIAGNOSIS — C183 Malignant neoplasm of hepatic flexure: Secondary | ICD-10-CM | POA: Diagnosis present

## 2018-08-30 DIAGNOSIS — K449 Diaphragmatic hernia without obstruction or gangrene: Secondary | ICD-10-CM | POA: Diagnosis not present

## 2018-08-30 DIAGNOSIS — E1129 Type 2 diabetes mellitus with other diabetic kidney complication: Secondary | ICD-10-CM | POA: Diagnosis not present

## 2018-08-30 DIAGNOSIS — I25119 Atherosclerotic heart disease of native coronary artery with unspecified angina pectoris: Secondary | ICD-10-CM | POA: Diagnosis not present

## 2018-08-30 DIAGNOSIS — Z5111 Encounter for antineoplastic chemotherapy: Secondary | ICD-10-CM

## 2018-08-30 DIAGNOSIS — E1122 Type 2 diabetes mellitus with diabetic chronic kidney disease: Secondary | ICD-10-CM | POA: Diagnosis not present

## 2018-08-30 DIAGNOSIS — Z955 Presence of coronary angioplasty implant and graft: Secondary | ICD-10-CM

## 2018-08-30 DIAGNOSIS — D500 Iron deficiency anemia secondary to blood loss (chronic): Secondary | ICD-10-CM | POA: Diagnosis present

## 2018-08-30 DIAGNOSIS — K635 Polyp of colon: Secondary | ICD-10-CM | POA: Diagnosis present

## 2018-08-30 DIAGNOSIS — R609 Edema, unspecified: Secondary | ICD-10-CM | POA: Diagnosis not present

## 2018-08-30 DIAGNOSIS — Z86010 Personal history of colonic polyps: Secondary | ICD-10-CM

## 2018-08-30 DIAGNOSIS — R600 Localized edema: Secondary | ICD-10-CM | POA: Diagnosis not present

## 2018-08-30 DIAGNOSIS — Z79890 Hormone replacement therapy (postmenopausal): Secondary | ICD-10-CM

## 2018-08-30 DIAGNOSIS — Z79891 Long term (current) use of opiate analgesic: Secondary | ICD-10-CM

## 2018-08-30 DIAGNOSIS — E790 Hyperuricemia without signs of inflammatory arthritis and tophaceous disease: Secondary | ICD-10-CM | POA: Diagnosis not present

## 2018-08-30 DIAGNOSIS — F419 Anxiety disorder, unspecified: Secondary | ICD-10-CM | POA: Diagnosis present

## 2018-08-30 DIAGNOSIS — I509 Heart failure, unspecified: Secondary | ICD-10-CM | POA: Diagnosis not present

## 2018-08-30 DIAGNOSIS — K3189 Other diseases of stomach and duodenum: Secondary | ICD-10-CM | POA: Diagnosis not present

## 2018-08-30 DIAGNOSIS — Z9981 Dependence on supplemental oxygen: Secondary | ICD-10-CM | POA: Diagnosis not present

## 2018-08-30 DIAGNOSIS — Z885 Allergy status to narcotic agent status: Secondary | ICD-10-CM

## 2018-08-30 DIAGNOSIS — Z801 Family history of malignant neoplasm of trachea, bronchus and lung: Secondary | ICD-10-CM

## 2018-08-30 DIAGNOSIS — Z7902 Long term (current) use of antithrombotics/antiplatelets: Secondary | ICD-10-CM | POA: Diagnosis not present

## 2018-08-30 DIAGNOSIS — R059 Cough, unspecified: Secondary | ICD-10-CM

## 2018-08-30 DIAGNOSIS — N289 Disorder of kidney and ureter, unspecified: Secondary | ICD-10-CM | POA: Diagnosis not present

## 2018-08-30 DIAGNOSIS — E669 Obesity, unspecified: Secondary | ICD-10-CM | POA: Diagnosis not present

## 2018-08-30 DIAGNOSIS — C3402 Malignant neoplasm of left main bronchus: Secondary | ICD-10-CM | POA: Diagnosis not present

## 2018-08-30 DIAGNOSIS — K5900 Constipation, unspecified: Secondary | ICD-10-CM | POA: Diagnosis not present

## 2018-08-30 DIAGNOSIS — D638 Anemia in other chronic diseases classified elsewhere: Secondary | ICD-10-CM | POA: Diagnosis not present

## 2018-08-30 DIAGNOSIS — R1013 Epigastric pain: Secondary | ICD-10-CM | POA: Diagnosis not present

## 2018-08-30 DIAGNOSIS — Z01810 Encounter for preprocedural cardiovascular examination: Secondary | ICD-10-CM | POA: Diagnosis not present

## 2018-08-30 DIAGNOSIS — D519 Vitamin B12 deficiency anemia, unspecified: Secondary | ICD-10-CM | POA: Diagnosis not present

## 2018-08-30 DIAGNOSIS — K5730 Diverticulosis of large intestine without perforation or abscess without bleeding: Secondary | ICD-10-CM | POA: Diagnosis present

## 2018-08-30 DIAGNOSIS — K2270 Barrett's esophagus without dysplasia: Secondary | ICD-10-CM | POA: Diagnosis not present

## 2018-08-30 DIAGNOSIS — I959 Hypotension, unspecified: Secondary | ICD-10-CM | POA: Diagnosis not present

## 2018-08-30 DIAGNOSIS — K319 Disease of stomach and duodenum, unspecified: Secondary | ICD-10-CM | POA: Diagnosis present

## 2018-08-30 DIAGNOSIS — C3490 Malignant neoplasm of unspecified part of unspecified bronchus or lung: Secondary | ICD-10-CM | POA: Diagnosis not present

## 2018-08-30 DIAGNOSIS — Z7189 Other specified counseling: Secondary | ICD-10-CM | POA: Diagnosis not present

## 2018-08-30 DIAGNOSIS — I129 Hypertensive chronic kidney disease with stage 1 through stage 4 chronic kidney disease, or unspecified chronic kidney disease: Secondary | ICD-10-CM | POA: Diagnosis not present

## 2018-08-30 DIAGNOSIS — Z6841 Body Mass Index (BMI) 40.0 and over, adult: Secondary | ICD-10-CM

## 2018-08-30 DIAGNOSIS — E559 Vitamin D deficiency, unspecified: Secondary | ICD-10-CM | POA: Diagnosis not present

## 2018-08-30 LAB — CBC WITH DIFFERENTIAL/PLATELET
Abs Immature Granulocytes: 0.02 10*3/uL (ref 0.00–0.07)
Basophils Absolute: 0.1 10*3/uL (ref 0.0–0.1)
Basophils Relative: 1 %
Eosinophils Absolute: 0.5 10*3/uL (ref 0.0–0.5)
Eosinophils Relative: 6 %
HCT: 25 % — ABNORMAL LOW (ref 36.0–46.0)
Hemoglobin: 7 g/dL — ABNORMAL LOW (ref 12.0–15.0)
Immature Granulocytes: 0 %
Lymphocytes Relative: 26 %
Lymphs Abs: 2 10*3/uL (ref 0.7–4.0)
MCH: 23.4 pg — ABNORMAL LOW (ref 26.0–34.0)
MCHC: 28 g/dL — ABNORMAL LOW (ref 30.0–36.0)
MCV: 83.6 fL (ref 80.0–100.0)
Monocytes Absolute: 0.8 10*3/uL (ref 0.1–1.0)
Monocytes Relative: 10 %
Neutro Abs: 4.3 10*3/uL (ref 1.7–7.7)
Neutrophils Relative %: 57 %
Platelets: 377 10*3/uL (ref 150–400)
RBC: 2.99 MIL/uL — ABNORMAL LOW (ref 3.87–5.11)
RDW: 16.2 % — ABNORMAL HIGH (ref 11.5–15.5)
WBC: 7.7 10*3/uL (ref 4.0–10.5)
nRBC: 0 % (ref 0.0–0.2)

## 2018-08-30 LAB — POC OCCULT BLOOD, ED: Fecal Occult Bld: NEGATIVE

## 2018-08-30 LAB — COMPREHENSIVE METABOLIC PANEL
ALT: 11 U/L (ref 0–44)
AST: 13 U/L — ABNORMAL LOW (ref 15–41)
Albumin: 3.4 g/dL — ABNORMAL LOW (ref 3.5–5.0)
Alkaline Phosphatase: 92 U/L (ref 38–126)
Anion gap: 9 (ref 5–15)
BUN: 23 mg/dL (ref 8–23)
CO2: 27 mmol/L (ref 22–32)
Calcium: 8.3 mg/dL — ABNORMAL LOW (ref 8.9–10.3)
Chloride: 102 mmol/L (ref 98–111)
Creatinine, Ser: 1.58 mg/dL — ABNORMAL HIGH (ref 0.44–1.00)
GFR calc Af Amer: 39 mL/min — ABNORMAL LOW (ref >60)
GFR calc non Af Amer: 33 mL/min — ABNORMAL LOW (ref >60)
Glucose, Bld: 241 mg/dL — ABNORMAL HIGH (ref 70–99)
Potassium: 4.2 mmol/L (ref 3.5–5.1)
Sodium: 138 mmol/L (ref 135–145)
Total Bilirubin: 0.3 mg/dL (ref 0.3–1.2)
Total Protein: 6.7 g/dL (ref 6.5–8.1)

## 2018-08-30 LAB — TROPONIN I: Troponin I: <0.03 ng/mL (ref <0.03)

## 2018-08-30 LAB — FOLATE: Folate: 8 ng/mL (ref >5.9)

## 2018-08-30 LAB — IRON AND TIBC
Iron: 9 ug/dL — ABNORMAL LOW (ref 28–170)
Saturation Ratios: 2 % — ABNORMAL LOW (ref 10.4–31.8)
TIBC: 431 ug/dL (ref 250–450)
UIBC: 422 ug/dL

## 2018-08-30 LAB — RETICULOCYTES
Immature Retic Fract: 26.5 % — ABNORMAL HIGH (ref 2.3–15.9)
RBC.: 3.02 MIL/uL — ABNORMAL LOW (ref 3.87–5.11)
Retic Count, Absolute: 53.6 10*3/uL (ref 19.0–186.0)
Retic Ct Pct: 1.8 % (ref 0.4–3.1)

## 2018-08-30 LAB — VITAMIN B12: Vitamin B-12: 192 pg/mL (ref 180–914)

## 2018-08-30 LAB — BRAIN NATRIURETIC PEPTIDE: B Natriuretic Peptide: 254 pg/mL — ABNORMAL HIGH (ref 0.0–100.0)

## 2018-08-30 LAB — FERRITIN: Ferritin: 4 ng/mL — ABNORMAL LOW (ref 11–307)

## 2018-08-30 LAB — PREPARE RBC (CROSSMATCH)

## 2018-08-30 MED ORDER — ACETAMINOPHEN 325 MG PO TABS
650.0000 mg | ORAL_TABLET | Freq: Four times a day (QID) | ORAL | Status: DC | PRN
  Administered 2018-08-31 – 2018-09-03 (×3): 650 mg via ORAL
  Filled 2018-08-30 (×3): qty 2

## 2018-08-30 MED ORDER — ATORVASTATIN CALCIUM 40 MG PO TABS
80.0000 mg | ORAL_TABLET | Freq: Every morning | ORAL | Status: DC
  Administered 2018-08-31 – 2018-09-22 (×22): 80 mg via ORAL
  Filled 2018-08-30 (×3): qty 2
  Filled 2018-08-30 (×4): qty 1
  Filled 2018-08-30: qty 2
  Filled 2018-08-30: qty 1
  Filled 2018-08-30 (×5): qty 2
  Filled 2018-08-30: qty 1
  Filled 2018-08-30: qty 2
  Filled 2018-08-30: qty 1
  Filled 2018-08-30 (×6): qty 2

## 2018-08-30 MED ORDER — SODIUM CHLORIDE 0.9% FLUSH
3.0000 mL | Freq: Two times a day (BID) | INTRAVENOUS | Status: DC
  Administered 2018-08-31 – 2018-09-21 (×36): 3 mL via INTRAVENOUS

## 2018-08-30 MED ORDER — GABAPENTIN 300 MG PO CAPS
300.0000 mg | ORAL_CAPSULE | Freq: Four times a day (QID) | ORAL | Status: DC
  Administered 2018-08-31 – 2018-09-22 (×85): 300 mg via ORAL
  Filled 2018-08-30 (×86): qty 1

## 2018-08-30 MED ORDER — FUROSEMIDE 10 MG/ML IJ SOLN
60.0000 mg | Freq: Once | INTRAMUSCULAR | Status: AC
  Administered 2018-08-30: 60 mg via INTRAVENOUS
  Filled 2018-08-30: qty 6

## 2018-08-30 MED ORDER — SODIUM CHLORIDE 0.9% FLUSH: 3.0000 mL | INTRAVENOUS | Status: DC | PRN

## 2018-08-30 MED ORDER — VITAMIN D 25 MCG (1000 UNIT) PO TABS
5000.0000 [IU] | ORAL_TABLET | Freq: Every day | ORAL | Status: DC
  Administered 2018-08-31 – 2018-09-22 (×22): 5000 [IU] via ORAL
  Filled 2018-08-30 (×22): qty 5

## 2018-08-30 MED ORDER — ALBUTEROL SULFATE (2.5 MG/3ML) 0.083% IN NEBU: 3.0000 mL | INHALATION_SOLUTION | RESPIRATORY_TRACT | Status: DC | PRN

## 2018-08-30 MED ORDER — NYSTATIN 100000 UNIT/GM EX POWD
Freq: Two times a day (BID) | CUTANEOUS | Status: DC
  Administered 2018-08-31 – 2018-09-22 (×39): via TOPICAL
  Filled 2018-08-30 (×4): qty 15

## 2018-08-30 MED ORDER — ALPRAZOLAM 1 MG PO TABS
1.0000 mg | ORAL_TABLET | Freq: Three times a day (TID) | ORAL | Status: DC | PRN
  Administered 2018-08-31: 1 mg via ORAL
  Filled 2018-08-30: qty 1

## 2018-08-30 MED ORDER — OXYCODONE-ACETAMINOPHEN 10-325 MG PO TABS: 1.0000 | ORAL_TABLET | Freq: Four times a day (QID) | ORAL | Status: DC | PRN

## 2018-08-30 MED ORDER — NITROGLYCERIN 0.4 MG SL SUBL
0.4000 mg | SUBLINGUAL_TABLET | SUBLINGUAL | Status: DC | PRN
  Administered 2018-09-19 (×2): 0.4 mg via SUBLINGUAL
  Filled 2018-08-30 (×2): qty 1

## 2018-08-30 MED ORDER — INSULIN ASPART 100 UNIT/ML ~~LOC~~ SOLN
0.0000 [IU] | Freq: Three times a day (TID) | SUBCUTANEOUS | Status: DC
  Administered 2018-08-31 (×3): 2 [IU] via SUBCUTANEOUS
  Administered 2018-09-01: 3 [IU] via SUBCUTANEOUS
  Administered 2018-09-01 (×2): 5 [IU] via SUBCUTANEOUS
  Administered 2018-09-02: 17:00:00 2 [IU] via SUBCUTANEOUS
  Administered 2018-09-02: 1 [IU] via SUBCUTANEOUS
  Administered 2018-09-03 – 2018-09-04 (×6): 2 [IU] via SUBCUTANEOUS
  Administered 2018-09-05: 3 [IU] via SUBCUTANEOUS
  Administered 2018-09-05: 18:00:00 2 [IU] via SUBCUTANEOUS
  Administered 2018-09-05: 13:00:00 3 [IU] via SUBCUTANEOUS
  Administered 2018-09-06 (×2): 2 [IU] via SUBCUTANEOUS
  Administered 2018-09-06: 3 [IU] via SUBCUTANEOUS

## 2018-08-30 MED ORDER — INSULIN ASPART 100 UNIT/ML ~~LOC~~ SOLN
0.0000 [IU] | Freq: Every day | SUBCUTANEOUS | Status: DC
  Administered 2018-09-01 (×2): 3 [IU] via SUBCUTANEOUS
  Administered 2018-09-04: 2 [IU] via SUBCUTANEOUS

## 2018-08-30 MED ORDER — FUROSEMIDE 10 MG/ML IJ SOLN
60.0000 mg | Freq: Two times a day (BID) | INTRAMUSCULAR | Status: DC
  Administered 2018-08-31: 07:00:00 60 mg via INTRAVENOUS
  Filled 2018-08-30: qty 6

## 2018-08-30 MED ORDER — INSULIN ASPART 100 UNIT/ML ~~LOC~~ SOLN: 1.0000 [IU] | Freq: Three times a day (TID) | SUBCUTANEOUS | Status: DC

## 2018-08-30 MED ORDER — ACETAMINOPHEN 650 MG RE SUPP: 650.0000 mg | Freq: Four times a day (QID) | RECTAL | Status: DC | PRN

## 2018-08-30 MED ORDER — GERHARDT'S BUTT CREAM
1.0000 "application " | TOPICAL_CREAM | Freq: Three times a day (TID) | CUTANEOUS | Status: DC
  Administered 2018-08-31 – 2018-09-22 (×52): 1 via TOPICAL
  Filled 2018-08-30 (×4): qty 1

## 2018-08-30 MED ORDER — DOCUSATE SODIUM 100 MG PO CAPS
300.0000 mg | ORAL_CAPSULE | Freq: Every day | ORAL | Status: DC
  Administered 2018-08-31 – 2018-09-02 (×3): 300 mg via ORAL
  Filled 2018-08-30 (×3): qty 3

## 2018-08-30 MED ORDER — HEPARIN SODIUM (PORCINE) 5000 UNIT/ML IJ SOLN
5000.0000 [IU] | Freq: Three times a day (TID) | INTRAMUSCULAR | Status: DC
  Administered 2018-08-31 – 2018-09-08 (×24): 5000 [IU] via SUBCUTANEOUS
  Filled 2018-08-30 (×25): qty 1

## 2018-08-30 MED ORDER — POTASSIUM CHLORIDE CRYS ER 10 MEQ PO TBCR
20.0000 meq | EXTENDED_RELEASE_TABLET | Freq: Two times a day (BID) | ORAL | Status: DC
  Administered 2018-08-31 – 2018-09-11 (×23): 20 meq via ORAL
  Filled 2018-08-30 (×23): qty 2

## 2018-08-30 MED ORDER — TOPIRAMATE 25 MG PO TABS
150.0000 mg | ORAL_TABLET | Freq: Every evening | ORAL | Status: DC
  Administered 2018-08-31 – 2018-09-22 (×23): 150 mg via ORAL
  Filled 2018-08-30 (×2): qty 2
  Filled 2018-08-30: qty 6
  Filled 2018-08-30 (×2): qty 2
  Filled 2018-08-30 (×5): qty 6
  Filled 2018-08-30 (×9): qty 2
  Filled 2018-08-30: qty 6
  Filled 2018-08-30 (×2): qty 2
  Filled 2018-08-30: qty 1
  Filled 2018-08-30: qty 2

## 2018-08-30 MED ORDER — INSULIN DETEMIR 100 UNIT/ML ~~LOC~~ SOLN
40.0000 [IU] | Freq: Every day | SUBCUTANEOUS | Status: DC
  Administered 2018-08-31: 40 [IU] via SUBCUTANEOUS
  Filled 2018-08-30 (×2): qty 0.4

## 2018-08-30 MED ORDER — SODIUM CHLORIDE 0.9 % IV SOLN
10.0000 mL/h | Freq: Once | INTRAVENOUS | Status: AC
  Administered 2018-08-31: 10 mL/h via INTRAVENOUS

## 2018-08-30 MED ORDER — PANTOPRAZOLE SODIUM 40 MG PO TBEC
40.0000 mg | DELAYED_RELEASE_TABLET | Freq: Two times a day (BID) | ORAL | Status: DC
  Administered 2018-08-31 – 2018-09-22 (×45): 40 mg via ORAL
  Filled 2018-08-30 (×46): qty 1

## 2018-08-30 MED ORDER — ONDANSETRON HCL 4 MG PO TABS
8.0000 mg | ORAL_TABLET | Freq: Three times a day (TID) | ORAL | Status: DC | PRN
  Administered 2018-09-02 – 2018-09-18 (×8): 8 mg via ORAL
  Filled 2018-08-30 (×9): qty 2

## 2018-08-30 MED ORDER — OMEGA-3-ACID ETHYL ESTERS 1 G PO CAPS
1.0000 g | ORAL_CAPSULE | Freq: Every day | ORAL | Status: DC
  Administered 2018-08-31 – 2018-09-22 (×22): 1 g via ORAL
  Filled 2018-08-30 (×22): qty 1

## 2018-08-30 MED ORDER — FERROUS SULFATE 325 (65 FE) MG PO TABS
325.0000 mg | ORAL_TABLET | Freq: Every day | ORAL | Status: DC
  Administered 2018-08-31 – 2018-09-07 (×8): 325 mg via ORAL
  Filled 2018-08-30 (×8): qty 1

## 2018-08-30 MED ORDER — TIZANIDINE HCL 4 MG PO TABS: 4.0000 mg | ORAL_TABLET | Freq: Three times a day (TID) | ORAL | Status: DC | PRN

## 2018-08-30 MED ORDER — UMECLIDINIUM-VILANTEROL 62.5-25 MCG/INH IN AEPB
1.0000 | INHALATION_SPRAY | Freq: Every day | RESPIRATORY_TRACT | Status: DC
  Administered 2018-08-31 – 2018-09-22 (×21): 1 via RESPIRATORY_TRACT
  Filled 2018-08-30 (×3): qty 14

## 2018-08-30 MED ORDER — LEVOTHYROXINE SODIUM 100 MCG PO TABS
200.0000 ug | ORAL_TABLET | Freq: Every day | ORAL | Status: DC
  Administered 2018-08-31 – 2018-09-22 (×23): 200 ug via ORAL
  Filled 2018-08-30 (×12): qty 2
  Filled 2018-08-30: qty 4
  Filled 2018-08-30 (×10): qty 2

## 2018-08-30 MED ORDER — ASPIRIN EC 81 MG PO TBEC
81.0000 mg | DELAYED_RELEASE_TABLET | Freq: Every day | ORAL | Status: DC
  Administered 2018-08-31 – 2018-09-22 (×22): 81 mg via ORAL
  Filled 2018-08-30 (×22): qty 1

## 2018-08-30 MED ORDER — SODIUM CHLORIDE 0.9 % IV SOLN
250.0000 mL | INTRAVENOUS | Status: DC | PRN
  Administered 2018-09-07 – 2018-09-10 (×2): 250 mL via INTRAVENOUS

## 2018-08-30 NOTE — H&P (Addendum)
TRH H&P    Patient Demographics:    Stephanie Combs, is a 68 y.o. female  MRN: [id number]  DOB - June 10, 1950  Admit Date - 08/30/2018  Referring MD/NP/PA: Nanda Quinton  Outpatient Primary MD for the patient is [name], MD  Patient coming from:  home  Chief complaint- SOB, Lower ext edema   HPI:    Stephanie Combs  is a 68 y.o. female, w CAD s/p DES prox LAD, CHF (EF 60-65%), Dm2, CKD stage3, Hypertension, Hypothyroidism, Chronic lymphedema, apparently presents with c/o dyspnea for the past 5 days, along with increase in lower ext edema, refractory to increase in oral lasix. Slight mostly dry cough.  Pt denies fever, chills, cp, palp, abd pain, n/v, diarrhea,  Brbpr.  Pt is taking iron for her chronic anemia. Pt notes having had prior colonoscopy, normal.  Hx of diverticulosis.    In ED,  T 98.3, P 84  R 18-24,  Bp 130/100,  Pox 100%,   Wt 138.3kg  Wbc 7.7, Hgb 7.0  (typically 9), Plt 377 Bun 23, Creatinine 1.58 Na 138, K 4.2 Alb 3.4 Trop <0.03 Glucose 241  CXR IMPRESSION: Mild left lower lobe atelectasis/infiltrate and small left effusion. Mild progression since the prior study.  Pt given lasix 61m iv x1 .    Pt will be admitted for dyspnea, from symptomatic anemia, increase in lower ext edema, failing outpatient oral lasix.        Review of systems:    In addition to the HPI above,  No Fever-chills, No Headache, No changes with Vision or hearing, No problems swallowing food or Liquids, No Chest pain, denies orthopnea, pnd No Abdominal pain, No Nausea or Vomiting, bowel movements are regular, No Blood in stool or Urine, No dysuria, No new skin rashes or bruises, No new joints pains-aches,  No new weakness, tingling, numbness in any extremity, + weight gain No polyuria, polydypsia or polyphagia, No significant Mental Stressors.  All other systems reviewed and are negative.    Past History of the following :    Past Medical History:  Diagnosis Date  . Anxiety   . CAD (coronary artery disease)    a. s/p DES to LAD and angioplasty to D1 in 05/2017  . CHF (congestive heart failure) (HCC)    Diastolic  . Chronic back pain   . Chronic pain   . COPD [place] disease[place]   . Degenerative disk disease   . Diabetes mellitus without complication ([place]   . History of medication noncompliance 11/2017   "the patient frequently self-adjusts medications without notifying her physicians"  . Hypertension   . Hypothyroidism   . On home O2    2.5 L N/C prn  . Pedal edema       Past Surgical History:  Procedure Laterality Date  . CORONARY STENT INTERVENTION N/A 05/22/2017   Procedure: CORONARY STENT INTERVENTION;  Surgeon: JMartinique Peter M, MD;  [facility];  Service: Cardiovascular;  Laterality: N/A;  . HERNIA REPAIR    .  RIGHT/LEFT HEART CATH AND CORONARY ANGIOGRAPHY N/A 05/19/2017   Procedure: RIGHT/LEFT HEART CATH AND CORONARY ANGIOGRAPHY;  Surgeon: Leonie Man, MD;  Location: [facility];  Service: Cardiovascular;  Laterality: N/A;  . TUBAL LIGATION        Social History:      Social History   Tobacco Use  . Smoking status: Former Smoker    Packs/day: 0.25    Types: Cigarettes    Last attempt to quit: 11/24/2014    Years since quitting: 3.7  . Smokeless tobacco: Never Used  Substance Use Topics  . Alcohol use: No    Alcohol/week: 0.0 standard drinks       Family History :     Family History  Problem Relation Age of Onset  . Stroke Mother   . Heart attack Father   . Diabetes Brother   . Cerebral palsy Brother   . Pneumonia Brother   . Diabetes Other   . Heart attack Other        Home Medications:   Prior to Admission medications   Medication Sig Start Date End Date Taking? Authorizing Provider  albuterol (PROAIR HFA) 108 (90 Base) MCG/ACT inhaler Inhale 2 puffs into the lungs every 4  (four) hours as needed. For shortness of breath 04/09/18  Yes Johnson, Clanford L, MD  albuterol (PROVENTIL) (2.5 MG/3ML) 0.083% nebulizer solution Take 2.5 mg by nebulization 2 (two) times daily as needed for wheezing or shortness of breath.    Yes [provider]  ALPRAZolam Duanne Moron) 1 MG tablet Take 1 mg by mouth 3 (three) times daily as needed for anxiety or sleep.  01/05/17  Yes [provider]  aspirin EC 81 MG tablet Take 81 mg by mouth daily.   Yes [provider]  atorvastatin (LIPITOR) 80 MG tablet Take 1 tablet (80 mg total) by mouth daily at 6 PM. Patient taking differently: Take 80 mg by mouth every morning.  05/26/17  Yes [name], MD  Cholecalciferol (VITAMIN D3) 5000 units CAPS Take 1 capsule (5,000 Units total) by mouth daily. 08/19/16  Yes Nida, Marella Chimes, MD  docusate sodium (COLACE) 100 MG capsule Take 300 mg by mouth daily.   Yes [provider]  ferrous sulfate 325 (65 FE) MG tablet Take 1 tablet (325 mg total) by mouth daily with breakfast. 02/05/17  Yes [name], MD  furosemide (LASIX) 20 MG tablet Take 32m po in the morning and 231min the evening Patient taking differently: Take 20-40 mg by mouth See admin instructions. Take 4082mo in the morning and 68m68m the evening 08/25/18  Yes Strader, Brit[place]PA-C  gabapentin (NEURONTIN) 600 MG tablet Take 600 mg by mouth 4 (four) times daily.   Yes [provider]  Hydrocortisone (GERHARDT'S BUTT CREAM) CREA Apply 1 application topically 3 (three) times daily. 05/26/17  Yes Neala Miggins,[name]  insulin aspart (NOVOLOG) 100 UNIT/ML injection Inject 1-18 Units into the skin 3 (three) times daily with meals. Patient taking differently: Inject 1-20 Units into the skin 3 (three) times daily with meals.  04/09/18  Yes Johnson, Clanford L, MD  insulin detemir (LEVEMIR) 100 UNIT/ML injection Inject 0.4 mLs (40 Units total) into the skin at bedtime. 11/26/17  Yes Tat, DaviShanon Brow  levothyroxine  (SYNTHROID, LEVOTHROID) 200 MCG tablet Take 1 tablet by mouth daily. 06/24/18  Yes [provider]  nitroGLYCERIN (NITROSTAT) 0.4 MG SL tablet Place 0.4 mg under the tongue every 5 (five) minutes as  needed for chest pain.  03/26/18  Yes [provider]  nystatin (MYCOSTATIN/NYSTOP) powder Apply topically 2 (two) times daily. 10/10/16  Yes Eber Jones, MD  Omega-3 Fatty Acids (FISH OIL PO) Take 1 capsule by mouth daily.    Yes [provider]  ondansetron (ZOFRAN) 8 MG tablet Take 8 mg by mouth every 8 (eight) hours as needed for nausea or vomiting.  08/11/18  Yes [provider]  oxyCODONE-acetaminophen (PERCOCET) 10-325 MG tablet Take 1 tablet by mouth every 6 (six) hours as needed for pain.   Yes [provider]  OXYGEN Inhale 2 L into the lungs continuous.   Yes [provider]  pantoprazole (PROTONIX) 40 MG tablet Take 1 tablet (40 mg total) by mouth 2 (two) times daily. 08/25/18  Yes Strader, [name] M, PA-C  potassium chloride (MICRO-K) 10 MEQ CR capsule Take 2 capsules (20 mEq total) by mouth 2 (two) times daily. *May take one additional tablet as needed for cramping 04/23/18  Yes Herminio Commons, MD  tiZANidine (ZANAFLEX) 4 MG tablet Take 1 tablet (4 mg total) by mouth every 8 (eight) hours as needed for muscle spasms. 07/08/18  Yes Kathie Dike, MD  topiramate (TOPAMAX) 50 MG tablet Take 150 mg by mouth every evening.  03/18/18  Yes [provider]  umeclidinium-vilanterol (ANORO ELLIPTA) 62.5-25 MCG/INH AEPB Inhale 1 puff into the lungs daily. Patient taking differently: Inhale 1 puff into the lungs daily as needed (for shortness of breath).  05/26/17  Yes [name], MD  VICTOZA 18 MG/3ML SOPN Inject 1.2 mg into the skin daily.  01/01/17  Yes [provider]  SURE COMFORT INS SYR 1CC/28G 28G X 1/2" 1 ML MISC USE TO INJECT INSULIN UP TO 4 TIMES DAILY. 03/06/17   Cassandria Anger, MD     Allergies:      Allergies  Allergen Reactions  . Dilaudid [Hydromorphone Hcl] Itching  . Midodrine Hcl Swelling    After one dose had anaphylactic reaction, had to call EMS.  Marland Kitchen Actifed Cold-Allergy [Chlorpheniramine-Phenylephrine]     "I was sick and red and it didn't agree with me at all"  . Doxycycline Nausea And Vomiting  . Other Cough    Pt states she is allergic to ragweed and that she starts coughing and sneezing like crazy  . Penicillins Hives and Itching    Tolerates Rocephin Has patient had a PCN reaction causing immediate rash, facial/tongue/throat swelling, SOB or lightheadedness with hypotension: Yes Has patient had a PCN reaction causing severe rash involving mucus membranes or skin necrosis: No Has patient had a PCN reaction that required hospitalization Yes Has patient had a PCN reaction occurring within the last 10 years: No If all of the above answers are "NO", then may proceed with Cephalosporin use.   . Reglan [Metoclopramide] Itching  . Valium [Diazepam] Itching  . Vistaril [Hydroxyzine Hcl] Itching     Physical Exam:   Vitals  Blood pressure (!) 140/96, pulse 84, temperature 98.3 F (36.8 C), temperature source Oral, resp. rate (!) 24, height '5\' 5"'  (1.651 m), weight (!) 138.3 kg, SpO2 100 %.  1.  General: axox3  2. Psychiatric: euthymic  3. Neurologic: cn2-12 intact, reflexes 2+ symmetric, diffuse with no clonus, motor 5/5 in all 4 ext  4. HEENMT:  Anicteric, pupils 1.17m symmetric, direct, consensual, near intact  5. Respiratory : CTAB  6. Cardiovascular : rrr s1, s2, no m/g/r  7. Gastrointestinal:  Abd: obese, soft, nt, nd, +bs  8. Skin:  No c/c, 1-2+ edema, No rash  9.Musculoskeletal:  Good ROM    Data Review:    CBC Recent Labs  Lab 08/30/18 2115  WBC 7.7  HGB 7.0*  HCT 25.0*  PLT 377  MCV 83.6  MCH 23.4*  MCHC 28.0*  RDW 16.2*  LYMPHSABS 2.0  MONOABS 0.8  EOSABS 0.5  BASOSABS 0.1    ------------------------------------------------------------------------------------------------------------------  Results for orders placed or performed during the hospital encounter of 08/30/18 (from the past 48 hour(s))  Comprehensive metabolic panel     Status: Abnormal   Collection Time: 08/30/18  9:15 PM  Result Value Ref Range   Sodium 138 135 - 145 mmol/L   Potassium 4.2 3.5 - 5.1 mmol/L   Chloride 102 98 - 111 mmol/L   CO2 27 22 - 32 mmol/L   Glucose, Bld 241 (H) 70 - 99 mg/dL   BUN 23 8 - 23 mg/dL   Creatinine, Ser 1.58 (H) 0.44 - 1.00 mg/dL   Calcium 8.3 (L) 8.9 - 10.3 mg/dL   Total Protein 6.7 6.5 - 8.1 g/dL   Albumin 3.4 (L) 3.5 - 5.0 g/dL   AST 13 (L) 15 - 41 U/L   ALT 11 0 - 44 U/L   Alkaline Phosphatase 92 38 - 126 U/L   Total Bilirubin 0.3 0.3 - 1.2 mg/dL   GFR calc non Af Amer 33 (L) >60 mL/min   GFR calc Af Amer 39 (L) >60 mL/min   Anion gap 9 5 - 15    Comment: Performed at [facility], [address], [place], [place] [address]  Brain natriuretic peptide     Status: Abnormal   Collection Time: 08/30/18  9:15 PM  Result Value Ref Range   B Natriuretic Peptide 254.0 (H) 0.0 - 100.0 pg/mL    Comment: Performed at [facility], [address], [place], [place] [address]  Troponin I - Once     Status: None   Collection Time: 08/30/18  9:15 PM  Result Value Ref Range   Troponin I <0.03 <0.03 ng/mL    Comment: Performed at [facility], [address], [place], [place] [address]  CBC with Differential     Status: Abnormal   Collection Time: 08/30/18  9:15 PM  Result Value Ref Range   WBC 7.7 4.0 - 10.5 K/uL   RBC 2.99 (L) 3.87 - 5.11 MIL/uL   Hemoglobin 7.0 (L) 12.0 - 15.0 g/dL   HCT 25.0 (L) 36.0 - 46.0 %   MCV 83.6 80.0 - 100.0 fL   MCH 23.4 (L) 26.0 - 34.0 pg   MCHC 28.0 (L) 30.0 - 36.0 g/dL   RDW 16.2 (H) 11.5 - 15.5 %   Platelets 377 150 - 400 K/uL   nRBC 0.0 0.0 - 0.2 %   Neutrophils Relative % 57 %   Neutro Abs 4.3 1.7 - 7.7 K/uL   Lymphocytes  Relative 26 %   Lymphs Abs 2.0 0.7 - 4.0 K/uL   Monocytes Relative 10 %   Monocytes Absolute 0.8 0.1 - 1.0 K/uL   Eosinophils Relative 6 %   Eosinophils Absolute 0.5 0.0 - 0.5 K/uL   Basophils Relative 1 %   Basophils Absolute 0.1 0.0 - 0.1 K/uL   Immature Granulocytes 0 %   Abs Immature Granulocytes 0.02 0.00 - 0.07 K/uL    Comment: Performed at [facility], [address] [place],  [address]  Reticulocytes     Status: Abnormal   Collection Time:  08/30/18  9:15 PM  Result Value Ref Range   Retic Ct Pct 1.8 0.4 - 3.1 %   RBC. 3.02 (L) 3.87 - 5.11 MIL/uL   Retic Count, Absolute 53.6 19.0 - 186.0 K/uL   Immature Retic Fract 26.5 (H) 2.3 - 15.9 %    Comment: Performed at [facility], [address] [place], [place] [address]  POC occult blood, ED Provider will collect     Status: None   Collection Time: 08/30/18 10:07 PM  Result Value Ref Range   Fecal Occult Bld NEGATIVE NEGATIVE    Chemistries  Recent Labs  Lab 08/30/18 2115  NA 138  K 4.2  CL 102  CO2 27  GLUCOSE 241*  BUN 23  CREATININE 1.58*  CALCIUM 8.3*  AST 13*  ALT 11  ALKPHOS 92  BILITOT 0.3   ------------------------------------------------------------------------------------------------------------------  ------------------------------------------------------------------------------------------------------------------ GFR: Estimated Creatinine Clearance: 48.1 mL/min (A) (by C-G formula based on SCr of 1.58 mg/dL (H)). Liver Function Tests: Recent Labs  Lab 08/30/18 2115  AST 13*  ALT 11  ALKPHOS 92  BILITOT 0.3  PROT 6.7  ALBUMIN 3.4*   No results for input(s): LIPASE, AMYLASE in the last 168 hours. No results for input(s): AMMONIA in the last 168 hours. Coagulation Profile: No results for input(s): INR, PROTIME in the last 168 hours. Cardiac Enzymes: Recent Labs  Lab 08/30/18 2115  TROPONINI <0.03   BNP (last 3 results) No results for input(s): PROBNP in the last 8760 hours.  HbA1C: No results for input(s): HGBA1C in the last 72 hours. CBG: No results for input(s): GLUCAP in the last 168 hours. Lipid Profile: No results for input(s): CHOL, HDL, LDLCALC, TRIG, CHOLHDL, LDLDIRECT in the last 72 hours. Thyroid Function Tests: No results for input(s): TSH, T4TOTAL, FREET4, T3FREE, THYROIDAB in the last 72 hours. Anemia Panel: Recent Labs    08/30/18 2115  RETICCTPCT 1.8    --------------------------------------------------------------------------------------------------------------- Urine analysis:    Component Value Date/Time   COLORURINE YELLOW 04/05/2018 1559   APPEARANCEUR HAZY (A) 04/05/2018 1559   LABSPEC >1.030 (H) 04/05/2018 1559   PHURINE 5.5 04/05/2018 1559   GLUCOSEU NEGATIVE 04/05/2018 1559   HGBUR SMALL (A) 04/05/2018 1559   BILIRUBINUR SMALL (A) 04/05/2018 1559   KETONESUR 15 (A) 04/05/2018 1559   PROTEINUR 100 (A) 04/05/2018 1559   UROBILINOGEN 0.2 12/04/2014 0650   NITRITE NEGATIVE 04/05/2018 1559   LEUKOCYTESUR SMALL (A) 04/05/2018 1559      Imaging Results:    Dg Chest Portable 1 View  Result Date: 08/30/2018 CLINICAL DATA:  Short of breath EXAM: PORTABLE CHEST 1 VIEW COMPARISON:  07/05/2018 FINDINGS: Mild cardiac enlargement. Negative for heart failure or edema. Mild left lower lobe airspace disease and small left effusion. Right lung base clear. IMPRESSION: Mild left lower lobe atelectasis/infiltrate and small left effusion. Mild progression since the prior study. Electronically Signed   By: Franchot Gallo M.D.   On: 08/30/2018 21:33    EKG nsr at 85, nl axis, no st-t changes c/w ischemia   Assessment & Plan:    Principal Problem:   Anemia Active Problems:   DM type 2 (diabetes mellitus, type 2) (HCC)   HTN (hypertension), benign   CKD (chronic kidney disease), stage III (HCC)   Hypothyroidism   CAD (coronary artery disease)   Dyspnea secondary to symptomatic anemia Type and Cross Transfuse 2 units prbc Check iron  , tibc, ferritin, b12, folate, esr Check cbc in am Cont ferrous sulfate 338m po qday  Chronic lymphedema (failed increase in oral lasix per pt) HOLD oral lasix Lasix 30m iv bid  CAD s/p DES Cont aspirin, Lipitor, no metoprolol=> syncope per pt  Dm2 fsbs ac and qhs,  Cont levemir Cont SSI  CKD stage 3 Check cmp in am  Hypothyroidism Check TSH Cont Levothyroxine  Gerd Cont Protonix   Diabetic neuropathy Cont Gabapentin Decrease to 3043mpo qid Cont Percocet prn  Copd Cont Anoro   DVT Prophylaxis-   Lovenox - SCDs   AM Labs Ordered, also please review Full Orders  Family Communication: Admission, patients condition and plan of care including tests being ordered have been discussed with the patient  who indicate understanding and agree with the plan and Code Status.  Code Status: DNR  Admission status: Observation  : Based on patients clinical presentation and evaluation of above clinical data, I have made determination that patient meets Observation criteria at this time.  Pt has failed oral lasix for lymphedema, and needs transfusion for symptomatic anemia, and close hospital monitoring due to hx of CHF  Time spent in minutes :  70   JaJani Gravel.D on 08/30/2018 at 10:47 PM

## 2018-08-30 NOTE — ED Triage Notes (Signed)
Called ems for CHF exacerbation symptoms. pcp increased lasix to get rid of fluid on legs. Pt does have edema in BLE that does seep fluids. Was told by PCP to come in and get IV lasix.

## 2018-08-30 NOTE — ED Provider Notes (Signed)
Emergency Department Provider Note   I have reviewed the triage vital signs and the nursing notes.   HISTORY  Chief Complaint Congestive Heart Failure   HPI Stephanie Combs is a 68 y.o. female with PMH of CAD, CHF (EF 60-65%), COPD, DM, HTN, and elevated BMI presents to the emergency department for evaluation of increased lower extremity edema and shortness of breath.  Patient has been managed by her outpatient cardiologist and PCP.  She states that she had a visit today and was encouraged to present to the emergency department for IV Lasix and admission.  Patient has been taking an increased Lasix dose over the last 3 weeks.  She typically takes 40 mg daily but was increased to 40 mg in the morning and 20 mg in the evening.  She has had increased urine output but states that her lower extremity swelling seems to be progressing to her abdomen.  She is feeling more short of breath but continues her home oxygen at 2 L.  She does note increased shortness of breath while taking her oxygen off for breaks.  She denies any fevers.  She has had mild cough but nothing severe.  She does not travel and has not had any known sick contacts.    Past Medical History:  Diagnosis Date  . Anxiety   . CAD (coronary artery disease)    a. s/p DES to LAD and angioplasty to D1 in 05/2017  . CHF (congestive heart failure) (HCC)    Diastolic  . Chronic back pain   . Chronic pain   . COPD (chronic obstructive pulmonary disease) ([place])   . Degenerative disk disease   . Diabetes mellitus without complication ([place])   . History of medication noncompliance 11/2017   "the patient frequently self-adjusts medications without notifying her physicians"  . Hypertension   . Hypothyroidism   . On home O2    2.5 L N/C prn  . Pedal edema     Patient Active Problem List   Diagnosis Date Noted  . Anemia 08/30/2018  . Chronic pain 07/05/2018  . On home O2 07/05/2018  . Opioid dependence ([place]) 07/05/2018  . CAD  (coronary artery disease) 05/14/2018  . AKI (acute kidney injury) ([place]) 04/05/2018  . Cellulitis 01/10/2018  . Cellulitis of right lower extremity 01/07/2018  . Altered mental status 01/06/2018  . Type 2 diabetes mellitus with hypoglycemia without coma ([place]) 11/24/2017  . Anxiety 11/24/2017  . Chronic respiratory failure with hypoxia ([place]) 11/24/2017  . Syncope 10/24/2017  . Bradycardia   . Syncope due to orthostatic hypotension 09/04/2017  . Obesity, Class III, BMI 40-49.9 (morbid obesity) ([place]) 09/04/2017  . Abnormal nuclear stress test 05/19/2017  . Atypical angina ([place]) 05/19/2017  . Edema 05/15/2017  . Skin ulcer ([place]) 01/30/2017  . Tinea cruris 10/10/2016  . Vitamin D deficiency 08/19/2016  . Personal history of noncompliance with medical treatment, presenting hazards to health 06/17/2016  . CAP (community acquired pneumonia) 07/25/2015  . Pressure ulcer 05/02/2015  . Hyponatremia 05/01/2015  . Acute-on-chronic kidney injury ([place]) 05/01/2015  . Uncontrolled type 2 diabetes mellitus with stage 3 chronic kidney disease ([place]) 05/01/2015  . Cellulitis of foot, right 03/24/2015  . Peripheral edema 12/04/2014  . Acute renal failure superimposed on stage 3 chronic kidney disease ([place]) 11/24/2014  . Bilateral edema of lower extremity   . Diabetic ulcer of right great toe ([place])   . Foot ulcer due to secondary DM ([place]) 11/07/2014  . Diabetic ulcer  of right foot ([place]) 11/07/2014  . Edema of lower extremity 05/27/2013  . CKD (chronic kidney disease), stage III ([place]) 04/14/2013  . Anemia in CKD (chronic kidney disease) 04/14/2013  . Chest pain 04/14/2013  . Dyspnea 04/14/2013  . Chronic diastolic CHF (congestive heart failure) ([place])   . Hypothyroidism   . COPD (chronic obstructive pulmonary disease) ([place]) 08/07/2012  . Morbid obesity ([place]) 08/07/2012  . DM type 2 (diabetes mellitus, type 2) ([place]) 08/07/2012  . HTN (hypertension), benign 08/07/2012    Past Surgical History:   Procedure Laterality Date  . CORONARY STENT INTERVENTION N/A 05/22/2017   Procedure: CORONARY STENT INTERVENTION;  Surgeon: Martinique, Peter M, MD;  Location: [facility];  Service: Cardiovascular;  Laterality: N/A;  . HERNIA REPAIR    . RIGHT/LEFT HEART CATH AND CORONARY ANGIOGRAPHY N/A 05/19/2017   Procedure: RIGHT/LEFT HEART CATH AND CORONARY ANGIOGRAPHY;  Surgeon: Leonie Man, MD;  Location: [facility];  Service: Cardiovascular;  Laterality: N/A;  . TUBAL LIGATION      Allergies Dilaudid [hydromorphone hcl]; Midodrine hcl; Actifed cold-allergy [chlorpheniramine-phenylephrine]; Doxycycline; Other; Penicillins; Reglan [metoclopramide]; Valium [diazepam]; and Vistaril [hydroxyzine hcl]  Family History  Problem Relation Age of Onset  . Stroke Mother   . Heart attack Father   . Diabetes Brother   . Cerebral palsy Brother   . Pneumonia Brother   . Diabetes Other   . Heart attack Other     Social History Social History   Tobacco Use  . Smoking status: Former Smoker    Packs/day: 0.25    Types: Cigarettes    Last attempt to quit: 11/24/2014    Years since quitting: 3.7  . Smokeless tobacco: Never Used  Substance Use Topics  . Alcohol use: No    Alcohol/week: 0.0 standard drinks  . Drug use: No    Review of Systems  Constitutional: No fever/chills. Positive fatigue.  Eyes: No visual changes. ENT: No sore throat. Cardiovascular: Denies chest pain. Positive worsening LE and abdominal wall edema.  Respiratory: Positive shortness of breath. Gastrointestinal: No abdominal pain.  No nausea, no vomiting.  No diarrhea.  No constipation. Genitourinary: Negative for dysuria. Musculoskeletal: Negative for back pain. Skin: Negative for rash. Neurological: Negative for headaches, focal weakness or numbness.  10-point ROS otherwise negative.  ____________________________________________   PHYSICAL EXAM:  VITAL SIGNS: ED Triage Vitals  Enc Vitals Group     BP  08/30/18 2021 (!) 130/100     Pulse Rate 08/30/18 2022 88     Resp 08/30/18 2023 18     Temp 08/30/18 2023 98.3 F (36.8 C)     Temp Source 08/30/18 2023 Oral     SpO2 08/30/18 2023 100 %     Weight 08/30/18 2023 (!) 305 lb (138.3 kg)     Height 08/30/18 2023 5\' 5"  (1.651 m)     Pain Score 08/30/18 2022 9   Constitutional: Alert and oriented. Well appearing and in no acute distress. Eyes: Conjunctivae are normal.  Head: Atraumatic. Nose: No congestion/rhinnorhea. Mouth/Throat: Mucous membranes are moist.   Neck: No stridor.   Cardiovascular: Normal rate, regular rhythm. Good peripheral circulation. Grossly normal heart sounds.   Respiratory: Normal respiratory effort.  No retractions. Lungs CTAB. Gastrointestinal: Soft and nontender. No distention. Rectal exam performed with chaperone. No gross blood or melena.  Musculoskeletal: No lower extremity tenderness with 3+ pitting edema bilaterally. No gross deformities of extremities. Neurologic:  Normal speech and language. No gross focal neurologic deficits  are appreciated.  Skin:  Skin is warm, dry and intact. No rash noted.  ____________________________________________   LABS (all labs ordered are listed, but only abnormal results are displayed)  Labs Reviewed  COMPREHENSIVE METABOLIC PANEL - Abnormal; Notable for the following components:      Result Value   Glucose, Bld 241 (*)    Creatinine, Ser 1.58 (*)    Calcium 8.3 (*)    Albumin 3.4 (*)    AST 13 (*)    GFR calc non Af Amer 33 (*)    GFR calc Af Amer 39 (*)    All other components within normal limits  BRAIN NATRIURETIC PEPTIDE - Abnormal; Notable for the following components:   B Natriuretic Peptide 254.0 (*)    All other components within normal limits  CBC WITH DIFFERENTIAL/PLATELET - Abnormal; Notable for the following components:   RBC 2.99 (*)    Hemoglobin 7.0 (*)    HCT 25.0 (*)    MCH 23.4 (*)    MCHC 28.0 (*)    RDW 16.2 (*)    All other components  within normal limits  TROPONIN I  VITAMIN B12  FOLATE  IRON AND TIBC  FERRITIN  RETICULOCYTES  POC OCCULT BLOOD, ED  TYPE AND SCREEN  PREPARE RBC (CROSSMATCH)   ____________________________________________  EKG   EKG Interpretation  Date/Time:  Monday August 30 2018 20:30:32 EDT Ventricular Rate:  86 PR Interval:    QRS Duration: 97 QT Interval:  395 QTC Calculation: 473 R Axis:   67 Text Interpretation:  Unknown rhythm, irregular rate Low voltage, precordial leads Baseline wander in lead(s) V2 V5 No STEMI.  Confirmed by Nanda Quinton [phone]) on 08/30/2018 8:33:19 PM       ____________________________________________  RADIOLOGY  Dg Chest Portable 1 View  Result Date: 08/30/2018 CLINICAL DATA:  Short of breath EXAM: PORTABLE CHEST 1 VIEW COMPARISON:  07/05/2018 FINDINGS: Mild cardiac enlargement. Negative for heart failure or edema. Mild left lower lobe airspace disease and small left effusion. Right lung base clear. IMPRESSION: Mild left lower lobe atelectasis/infiltrate and small left effusion. Mild progression since the prior study. Electronically Signed   By: Franchot Gallo M.D.   On: 08/30/2018 21:33    ____________________________________________   PROCEDURES  Procedure(s) performed:   Procedures  CRITICAL CARE Performed by: Margette Fast Total critical care time: 35 minutes Critical care time was exclusive of separately billable procedures and treating other patients. Critical care was necessary to treat or prevent imminent or life-threatening deterioration. Critical care was time spent personally by me on the following activities: development of treatment plan with patient and/or surrogate as well as nursing, discussions with consultants, evaluation of patient's response to treatment, examination of patient, obtaining history from patient or surrogate, ordering and performing treatments and interventions, ordering and review of laboratory studies, ordering and  review of radiographic studies, pulse oximetry and re-evaluation of patient's condition.  Nanda Quinton, MD Emergency Medicine  ____________________________________________   INITIAL IMPRESSION / ASSESSMENT AND PLAN / ED COURSE  Pertinent labs & imaging results that were available during my care of the patient were reviewed by me and considered in my medical decision making (see chart for details).   Patient presents to the emergency department with increased fluid burden despite increasing her outpatient Lasix.  No increased oxygen requirement does feel increasingly short of breath during her oxygen breaks.  She was referred by her PCP for admission and IV Lasix, according to the patient. Very low suspicion  for underlying infectious process such as COVID. Plan for CXR, labs, and reassess.   Chest x-ray reviewed.  Favor atelectasis versus infiltrate.  Patient without fever or other infection type symptoms.  Patient is found to have worsening anemia with hemoglobin now at 7.  Rectal exam performed with no gross blood or melena.  Hemoccult pending.  I have added on anemia panel.  Suspect chronic disease is the underlying cause. Will transfuse 2U PRBC slowly with lasix.   Discussed patient's case with Hospitalist, Dr. Maudie Mercury to request admission. Patient and family (if present) updated with plan. Care transferred to Hospitalist service.  I reviewed all nursing notes, vitals, pertinent old records, EKGs, labs, imaging (as available).  ____________________________________________  FINAL CLINICAL IMPRESSION(S) / ED DIAGNOSES  Final diagnoses:  Symptomatic anemia  Bilateral lower extremity edema    MEDICATIONS GIVEN DURING THIS VISIT:  Medications  0.9 %  sodium chloride infusion (has no administration in time range)  furosemide (LASIX) injection 60 mg (has no administration in time range)    Note:  This document was prepared using Dragon voice recognition software and may include  unintentional dictation errors.  Nanda Quinton, MD Emergency Medicine    , [facility], MD 08/30/18 2224

## 2018-08-31 DIAGNOSIS — C3402 Malignant neoplasm of left main bronchus: Secondary | ICD-10-CM | POA: Diagnosis not present

## 2018-08-31 DIAGNOSIS — J9611 Chronic respiratory failure with hypoxia: Secondary | ICD-10-CM | POA: Diagnosis present

## 2018-08-31 DIAGNOSIS — D500 Iron deficiency anemia secondary to blood loss (chronic): Secondary | ICD-10-CM | POA: Diagnosis not present

## 2018-08-31 DIAGNOSIS — D124 Benign neoplasm of descending colon: Secondary | ICD-10-CM | POA: Diagnosis not present

## 2018-08-31 DIAGNOSIS — I2510 Atherosclerotic heart disease of native coronary artery without angina pectoris: Secondary | ICD-10-CM | POA: Diagnosis not present

## 2018-08-31 DIAGNOSIS — J9819 Other pulmonary collapse: Secondary | ICD-10-CM | POA: Diagnosis not present

## 2018-08-31 DIAGNOSIS — J449 Chronic obstructive pulmonary disease, unspecified: Secondary | ICD-10-CM | POA: Diagnosis not present

## 2018-08-31 DIAGNOSIS — I10 Essential (primary) hypertension: Secondary | ICD-10-CM

## 2018-08-31 DIAGNOSIS — K6389 Other specified diseases of intestine: Secondary | ICD-10-CM | POA: Diagnosis not present

## 2018-08-31 DIAGNOSIS — D120 Benign neoplasm of cecum: Secondary | ICD-10-CM | POA: Diagnosis not present

## 2018-08-31 DIAGNOSIS — D490 Neoplasm of unspecified behavior of digestive system: Secondary | ICD-10-CM | POA: Diagnosis not present

## 2018-08-31 DIAGNOSIS — D519 Vitamin B12 deficiency anemia, unspecified: Secondary | ICD-10-CM | POA: Diagnosis not present

## 2018-08-31 DIAGNOSIS — J189 Pneumonia, unspecified organism: Secondary | ICD-10-CM | POA: Diagnosis not present

## 2018-08-31 DIAGNOSIS — C183 Malignant neoplasm of hepatic flexure: Secondary | ICD-10-CM | POA: Diagnosis not present

## 2018-08-31 DIAGNOSIS — M79661 Pain in right lower leg: Secondary | ICD-10-CM | POA: Diagnosis not present

## 2018-08-31 DIAGNOSIS — R195 Other fecal abnormalities: Secondary | ICD-10-CM | POA: Diagnosis not present

## 2018-08-31 DIAGNOSIS — R918 Other nonspecific abnormal finding of lung field: Secondary | ICD-10-CM | POA: Diagnosis not present

## 2018-08-31 DIAGNOSIS — K5900 Constipation, unspecified: Secondary | ICD-10-CM | POA: Diagnosis not present

## 2018-08-31 DIAGNOSIS — F419 Anxiety disorder, unspecified: Secondary | ICD-10-CM | POA: Diagnosis present

## 2018-08-31 DIAGNOSIS — N182 Chronic kidney disease, stage 2 (mild): Secondary | ICD-10-CM | POA: Diagnosis not present

## 2018-08-31 DIAGNOSIS — M549 Dorsalgia, unspecified: Secondary | ICD-10-CM | POA: Diagnosis present

## 2018-08-31 DIAGNOSIS — Z7189 Other specified counseling: Secondary | ICD-10-CM | POA: Diagnosis not present

## 2018-08-31 DIAGNOSIS — Z6841 Body Mass Index (BMI) 40.0 and over, adult: Secondary | ICD-10-CM | POA: Diagnosis not present

## 2018-08-31 DIAGNOSIS — M79662 Pain in left lower leg: Secondary | ICD-10-CM | POA: Diagnosis not present

## 2018-08-31 DIAGNOSIS — D509 Iron deficiency anemia, unspecified: Secondary | ICD-10-CM | POA: Diagnosis not present

## 2018-08-31 DIAGNOSIS — Z66 Do not resuscitate: Secondary | ICD-10-CM | POA: Diagnosis not present

## 2018-08-31 DIAGNOSIS — E1165 Type 2 diabetes mellitus with hyperglycemia: Secondary | ICD-10-CM | POA: Diagnosis present

## 2018-08-31 DIAGNOSIS — D128 Benign neoplasm of rectum: Secondary | ICD-10-CM | POA: Diagnosis not present

## 2018-08-31 DIAGNOSIS — K228 Other specified diseases of esophagus: Secondary | ICD-10-CM | POA: Diagnosis not present

## 2018-08-31 DIAGNOSIS — E1142 Type 2 diabetes mellitus with diabetic polyneuropathy: Secondary | ICD-10-CM | POA: Diagnosis present

## 2018-08-31 DIAGNOSIS — C189 Malignant neoplasm of colon, unspecified: Secondary | ICD-10-CM | POA: Diagnosis not present

## 2018-08-31 DIAGNOSIS — J90 Pleural effusion, not elsewhere classified: Secondary | ICD-10-CM | POA: Diagnosis not present

## 2018-08-31 DIAGNOSIS — G8929 Other chronic pain: Secondary | ICD-10-CM | POA: Diagnosis present

## 2018-08-31 DIAGNOSIS — R600 Localized edema: Secondary | ICD-10-CM | POA: Diagnosis not present

## 2018-08-31 DIAGNOSIS — I5033 Acute on chronic diastolic (congestive) heart failure: Secondary | ICD-10-CM | POA: Diagnosis not present

## 2018-08-31 DIAGNOSIS — Z515 Encounter for palliative care: Secondary | ICD-10-CM | POA: Diagnosis present

## 2018-08-31 DIAGNOSIS — Z87891 Personal history of nicotine dependence: Secondary | ICD-10-CM | POA: Diagnosis not present

## 2018-08-31 DIAGNOSIS — N183 Chronic kidney disease, stage 3 (moderate): Secondary | ICD-10-CM | POA: Diagnosis not present

## 2018-08-31 DIAGNOSIS — I25119 Atherosclerotic heart disease of native coronary artery with unspecified angina pectoris: Secondary | ICD-10-CM | POA: Diagnosis not present

## 2018-08-31 DIAGNOSIS — D649 Anemia, unspecified: Secondary | ICD-10-CM | POA: Diagnosis present

## 2018-08-31 DIAGNOSIS — I509 Heart failure, unspecified: Secondary | ICD-10-CM | POA: Diagnosis not present

## 2018-08-31 DIAGNOSIS — I110 Hypertensive heart disease with heart failure: Secondary | ICD-10-CM | POA: Diagnosis not present

## 2018-08-31 DIAGNOSIS — K449 Diaphragmatic hernia without obstruction or gangrene: Secondary | ICD-10-CM | POA: Diagnosis not present

## 2018-08-31 DIAGNOSIS — Z01810 Encounter for preprocedural cardiovascular examination: Secondary | ICD-10-CM | POA: Diagnosis not present

## 2018-08-31 DIAGNOSIS — K3189 Other diseases of stomach and duodenum: Secondary | ICD-10-CM | POA: Diagnosis not present

## 2018-08-31 DIAGNOSIS — D638 Anemia in other chronic diseases classified elsewhere: Secondary | ICD-10-CM | POA: Diagnosis present

## 2018-08-31 DIAGNOSIS — N179 Acute kidney failure, unspecified: Secondary | ICD-10-CM | POA: Diagnosis not present

## 2018-08-31 DIAGNOSIS — I5032 Chronic diastolic (congestive) heart failure: Secondary | ICD-10-CM | POA: Diagnosis not present

## 2018-08-31 DIAGNOSIS — C182 Malignant neoplasm of ascending colon: Secondary | ICD-10-CM | POA: Diagnosis not present

## 2018-08-31 DIAGNOSIS — C19 Malignant neoplasm of rectosigmoid junction: Secondary | ICD-10-CM | POA: Diagnosis present

## 2018-08-31 DIAGNOSIS — K2270 Barrett's esophagus without dysplasia: Secondary | ICD-10-CM | POA: Diagnosis not present

## 2018-08-31 DIAGNOSIS — J811 Chronic pulmonary edema: Secondary | ICD-10-CM | POA: Diagnosis not present

## 2018-08-31 DIAGNOSIS — C3412 Malignant neoplasm of upper lobe, left bronchus or lung: Secondary | ICD-10-CM | POA: Diagnosis not present

## 2018-08-31 DIAGNOSIS — R112 Nausea with vomiting, unspecified: Secondary | ICD-10-CM | POA: Diagnosis not present

## 2018-08-31 DIAGNOSIS — K635 Polyp of colon: Secondary | ICD-10-CM | POA: Diagnosis not present

## 2018-08-31 DIAGNOSIS — N200 Calculus of kidney: Secondary | ICD-10-CM | POA: Diagnosis not present

## 2018-08-31 DIAGNOSIS — K621 Rectal polyp: Secondary | ICD-10-CM | POA: Diagnosis not present

## 2018-08-31 DIAGNOSIS — C3490 Malignant neoplasm of unspecified part of unspecified bronchus or lung: Secondary | ICD-10-CM | POA: Diagnosis not present

## 2018-08-31 DIAGNOSIS — E1122 Type 2 diabetes mellitus with diabetic chronic kidney disease: Secondary | ICD-10-CM | POA: Diagnosis not present

## 2018-08-31 DIAGNOSIS — R1013 Epigastric pain: Secondary | ICD-10-CM | POA: Diagnosis not present

## 2018-08-31 DIAGNOSIS — L03116 Cellulitis of left lower limb: Secondary | ICD-10-CM | POA: Diagnosis not present

## 2018-08-31 DIAGNOSIS — D508 Other iron deficiency anemias: Secondary | ICD-10-CM | POA: Diagnosis not present

## 2018-08-31 DIAGNOSIS — R82993 Hyperuricosuria: Secondary | ICD-10-CM | POA: Diagnosis not present

## 2018-08-31 DIAGNOSIS — I130 Hypertensive heart and chronic kidney disease with heart failure and stage 1 through stage 4 chronic kidney disease, or unspecified chronic kidney disease: Secondary | ICD-10-CM | POA: Diagnosis not present

## 2018-08-31 DIAGNOSIS — E039 Hypothyroidism, unspecified: Secondary | ICD-10-CM | POA: Diagnosis present

## 2018-08-31 DIAGNOSIS — C771 Secondary and unspecified malignant neoplasm of intrathoracic lymph nodes: Secondary | ICD-10-CM | POA: Diagnosis not present

## 2018-08-31 LAB — GLUCOSE, CAPILLARY
Glucose-Capillary: 184 mg/dL — ABNORMAL HIGH (ref 70–99)
Glucose-Capillary: 195 mg/dL — ABNORMAL HIGH (ref 70–99)
Glucose-Capillary: 184 mg/dL — ABNORMAL HIGH (ref 70–99)
Glucose-Capillary: 195 mg/dL — ABNORMAL HIGH (ref 70–99)
Glucose-Capillary: 189 mg/dL — ABNORMAL HIGH (ref 70–99)

## 2018-08-31 LAB — COMPREHENSIVE METABOLIC PANEL
ALT: 11 U/L (ref 0–44)
AST: 11 U/L — ABNORMAL LOW (ref 15–41)
Albumin: 3.5 g/dL (ref 3.5–5.0)
Alkaline Phosphatase: 92 U/L (ref 38–126)
Anion gap: 9 (ref 5–15)
BUN: 22 mg/dL (ref 8–23)
CO2: 28 mmol/L (ref 22–32)
Calcium: 8.4 mg/dL — ABNORMAL LOW (ref 8.9–10.3)
Chloride: 101 mmol/L (ref 98–111)
Creatinine, Ser: 1.46 mg/dL — ABNORMAL HIGH (ref 0.44–1.00)
GFR calc Af Amer: 42 mL/min — ABNORMAL LOW (ref >60)
GFR calc non Af Amer: 37 mL/min — ABNORMAL LOW (ref >60)
Glucose, Bld: 213 mg/dL — ABNORMAL HIGH (ref 70–99)
Potassium: 3.9 mmol/L (ref 3.5–5.1)
Sodium: 138 mmol/L (ref 135–145)
Total Bilirubin: 1.1 mg/dL (ref 0.3–1.2)
Total Protein: 6.6 g/dL (ref 6.5–8.1)

## 2018-08-31 LAB — CBC
HCT: 30.9 % — ABNORMAL LOW (ref 36.0–46.0)
Hemoglobin: 8.9 g/dL — ABNORMAL LOW (ref 12.0–15.0)
MCH: 24.5 pg — ABNORMAL LOW (ref 26.0–34.0)
MCHC: 28.8 g/dL — ABNORMAL LOW (ref 30.0–36.0)
MCV: 85.1 fL (ref 80.0–100.0)
Platelets: 363 10*3/uL (ref 150–400)
RBC: 3.63 MIL/uL — ABNORMAL LOW (ref 3.87–5.11)
RDW: 15.8 % — ABNORMAL HIGH (ref 11.5–15.5)
WBC: 7.3 10*3/uL (ref 4.0–10.5)
nRBC: 0 % (ref 0.0–0.2)

## 2018-08-31 LAB — TSH: TSH: 0.813 u[IU]/mL (ref 0.350–4.500)

## 2018-08-31 LAB — ABO/RH: ABO/RH(D): A POS

## 2018-08-31 MED ORDER — INSULIN DETEMIR 100 UNIT/ML ~~LOC~~ SOLN
30.0000 [IU] | Freq: Every day | SUBCUTANEOUS | Status: DC
  Administered 2018-09-01 – 2018-09-05 (×6): 30 [IU] via SUBCUTANEOUS
  Filled 2018-08-31 (×7): qty 0.3

## 2018-08-31 MED ORDER — OXYCODONE-ACETAMINOPHEN 5-325 MG PO TABS
1.0000 | ORAL_TABLET | Freq: Four times a day (QID) | ORAL | Status: DC | PRN
  Administered 2018-08-31 – 2018-09-22 (×51): 1 via ORAL
  Filled 2018-08-31 (×51): qty 1

## 2018-08-31 MED ORDER — TIZANIDINE HCL 4 MG PO TABS
2.0000 mg | ORAL_TABLET | Freq: Three times a day (TID) | ORAL | Status: DC | PRN
  Administered 2018-09-04 – 2018-09-21 (×9): 2 mg via ORAL
  Filled 2018-08-31 (×9): qty 1

## 2018-08-31 MED ORDER — ALPRAZOLAM 0.5 MG PO TABS
0.5000 mg | ORAL_TABLET | Freq: Three times a day (TID) | ORAL | Status: DC | PRN
  Administered 2018-08-31 – 2018-09-01 (×4): 0.5 mg via ORAL
  Filled 2018-08-31 (×4): qty 1

## 2018-08-31 MED ORDER — FUROSEMIDE 10 MG/ML IJ SOLN
40.0000 mg | Freq: Two times a day (BID) | INTRAMUSCULAR | Status: DC
  Administered 2018-08-31 – 2018-09-02 (×5): 40 mg via INTRAVENOUS
  Filled 2018-08-31 (×5): qty 4

## 2018-08-31 MED ORDER — OXYCODONE HCL 5 MG PO TABS
5.0000 mg | ORAL_TABLET | Freq: Four times a day (QID) | ORAL | Status: DC | PRN
  Administered 2018-08-31 – 2018-09-22 (×51): 5 mg via ORAL
  Filled 2018-08-31 (×51): qty 1

## 2018-08-31 NOTE — Progress Notes (Signed)
CBG machine not transferred pts results. CBG results was 185.

## 2018-08-31 NOTE — TOC Initial Note (Signed)
Transition of Care [facility]) - Initial/Assessment Note    Patient Details  Name: Stephanie Combs MRN: [id number] Date of Birth: 04-Jun-1950  Transition of [facility]) CM/SW Contact:    Ihor Gully, LCSW Phone Number: 08/31/2018, 5:11 PM  Clinical Narrative:                 Patient lives home alone and is on home oxygen. She is active with [name] with and aide, RN and PT.  Patient named multiple items of DME in the home (bedside commode, walker, wc, shower chair, shower stool, 3 canes). She states that she plans to return home with existing [facility] services.   Expected Discharge Plan: [place] Barriers to Discharge: No Barriers Identified   Patient Goals and CMS Choice        Expected Discharge Plan and Services Expected Discharge Plan: [place]       Living arrangements for the past 2 months: [place] Agency: [place]  Prior Living Arrangements/Services Living arrangements for the past 2 months: Single Family Home Lives with:: Self Patient language and need for interpreter reviewed:: No Do you feel safe going back to the place where you live?: Yes      Need for Family Participation in Patient Care: Yes (Comment) Care giver support system in place?: Yes (comment) Current home services: Homehealth aide, Home PT, Home RN, DME Criminal Activity/Legal Involvement Pertinent to Current Situation/Hospitalization: No - Comment as needed  Activities of Daily Living Home Assistive Devices/Equipment: Bedside commode/3-in-1, CBG Meter, Grab bars around toilet, Grab bars in shower, Oxygen, Raised toilet seat with rails, Shower chair with back, Walker (specify type) ADL Screening (condition at time of admission) Patient's cognitive ability adequate to safely complete daily activities?: Yes Is the patient deaf or have difficulty hearing?: No Does the patient have difficulty seeing, even when  wearing glasses/contacts?: No Does the patient have difficulty concentrating, remembering, or making decisions?: No Patient able to express need for assistance with ADLs?: Yes Does the patient have difficulty dressing or bathing?: No Independently performs ADLs?: Yes (appropriate for developmental age) Does the patient have difficulty walking or climbing stairs?: Yes Weakness of Legs: None Weakness of Arms/Hands: None  Permission Sought/Granted         Permission granted to share info w AGENCY: [place], [name]         Emotional Assessment Appearance:: Appears stated age   Affect (typically observed): Calm Orientation: : Oriented to Self, Oriented to  Time, Oriented to Situation, Oriented to Place Alcohol / Substance Use: Not Applicable Psych Involvement: No (comment)  Admission diagnosis:  Bilateral lower extremity edema [R60.0] Symptomatic anemia [D64.9] Patient Active Problem List   Diagnosis Date Noted  . Symptomatic anemia 08/31/2018  . Anemia 08/30/2018  . Chronic pain 07/05/2018  . On home O2 07/05/2018  . Opioid dependence ([place]) 07/05/2018  . CAD (coronary artery disease) 05/14/2018  . AKI (acute kidney injury) ([place]) 04/05/2018  . Cellulitis 01/10/2018  . Cellulitis of right lower extremity 01/07/2018  . Altered mental status 01/06/2018  . Type 2 diabetes mellitus with hypoglycemia without coma ([place]) 11/24/2017  . Anxiety 11/24/2017  . Chronic respiratory failure with hypoxia ([place]) 11/24/2017  . Syncope 10/24/2017  . Bradycardia   . Syncope due to orthostatic hypotension 09/04/2017  .  Obesity, Class III, BMI 40-49.9 (morbid obesity) ([place]) 09/04/2017  . Abnormal nuclear stress test 05/19/2017  . Atypical angina ([place]) 05/19/2017  . Edema 05/15/2017  . Skin ulcer ([place]) 01/30/2017  . Tinea cruris 10/10/2016  . Vitamin D deficiency 08/19/2016  . Personal history of noncompliance with medical treatment, presenting hazards to health 06/17/2016  . CAP  (community acquired pneumonia) 07/25/2015  . Pressure ulcer 05/02/2015  . Hyponatremia 05/01/2015  . Acute-on-chronic kidney injury ([place]) 05/01/2015  . Uncontrolled type 2 diabetes mellitus with stage 3 chronic kidney disease ([place]) 05/01/2015  . Cellulitis of foot, right 03/24/2015  . Peripheral edema 12/04/2014  . Acute renal failure superimposed on stage 3 chronic kidney disease ([place]) 11/24/2014  . Bilateral edema of lower extremity   . Diabetic ulcer of right great toe ([place])   . Foot ulcer due to secondary DM ([place]) 11/07/2014  . Diabetic ulcer of right foot ([place]) 11/07/2014  . Edema of lower extremity 05/27/2013  . CKD (chronic kidney disease), stage III ([place]) 04/14/2013  . Anemia in CKD (chronic kidney disease) 04/14/2013  . Chest pain 04/14/2013  . Dyspnea 04/14/2013  . Chronic diastolic CHF (congestive heart failure) ([place])   . Hypothyroidism   . COPD (chronic obstructive pulmonary disease) ([place]) 08/07/2012  . Morbid obesity ([place]) 08/07/2012  . DM type 2 (diabetes mellitus, type 2) ([place]) 08/07/2012  . HTN (hypertension), benign 08/07/2012   PCP:  [name], MD Pharmacy:   [facility], [place] [name] [address] Phone: [phone] Fax: [phone]     Social Determinants of Health (SDOH) Interventions    Readmission Risk Interventions No flowsheet data found.

## 2018-08-31 NOTE — Progress Notes (Signed)
PROGRESS NOTE  Stephanie Combs  IRW:[id number]  DOB: 1950/10/28  DOA: 08/30/2018 PCP: [name], MD   Brief Admission Hx: 68 y/o female with CAD, diastolic CHF, DM type 2, hypothyroidism, chronic lymphedema presented with 5 days of progressive shortness of breath and progressive edema in the lower extremities not responding to oral Lasix.  She has been admitted for diuresis.  MDM/Assessment & Plan:   1. Symptomatic anemia-patient is being transfused 2 units packed red blood cells, will follow hemoglobin and monitor symptoms.  Continue iron supplementation. 2. Chronic lymphedema- she has had progressive edema despite oral Lasix which usually keeps her edema under control.  She is temporarily being treated with IV Lasix. 3. CAD status post DES-continue cardiac medications aspirin and Lipitor. 4. Type 2 diabetes mellitus-continue to monitor blood glucose, follow CBG, continue Levemir and sliding scale coverage. 5. Stage III CKD-monitor creatinine closely in the setting of IV diuresis. 6. Hypothyroidism-continue oral levothyroxine. 7. GERD-Protonix for GI protection. 8. Severe peripheral diabetic polyneuropathy- she is on high-dose gabapentin and it was reduced to 300 mg 4 times daily. 9. COPD-currently symptoms have been stable-continue home bronchodilators.  DVT prophylaxis: Lovenox Code Status: DNR Family Communication: Patient updated at bedside Disposition Plan: Home in 1 to 2 days   Consultants:    Procedures:    Antimicrobials:     Subjective: Patient reports she remains short of breath.  Objective: Vitals:   08/31/18 0330 08/31/18 0355 08/31/18 0500 08/31/18 0645  BP: (!) 123/47 129/70  (!) 137/53  Pulse: 64 87  79  Resp: 17 18  17   Temp: 98.3 F (36.8 C) 98.2 F (36.8 C)  97.8 F (36.6 C)  TempSrc: Oral Oral  Oral  SpO2: 100% 100%  100%  Weight:   (!) 140.6 kg   Height:        Intake/Output Summary (Last 24 hours) at 08/31/2018 1142 Last data filed at  08/31/2018 7619 Gross per 24 hour  Intake 919.43 ml  Output 1700 ml  Net -780.57 ml   Filed Weights   08/30/18 2023 08/30/18 2305 08/31/18 0500  Weight: (!) 138.3 kg (!) 139.7 kg (!) 140.6 kg     REVIEW OF SYSTEMS  As per history otherwise all reviewed and reported negative  Exam:  General exam: Patient awake alert in no distress cooperative and pleasant. Respiratory system: Clear. No increased work of breathing. Cardiovascular system: S1 & S2 heard. No JVD, murmurs, gallops, clicks or pedal edema. Gastrointestinal system: Abdomen is nondistended, soft and nontender. Normal bowel sounds heard. Central nervous system: Alert and oriented. No focal neurological deficits. Extremities: Chronic lymphedema 2+ pitting edema bilateral lower extremities.  Data Reviewed: Basic Metabolic Panel: Recent Labs  Lab 08/30/18 2115 08/31/18 0908  NA 138 138  K 4.2 3.9  CL 102 101  CO2 27 28  GLUCOSE 241* 213*  BUN 23 22  CREATININE 1.58* 1.46*  CALCIUM 8.3* 8.4*   Liver Function Tests: Recent Labs  Lab 08/30/18 2115 08/31/18 0908  AST 13* 11*  ALT 11 11  ALKPHOS 92 92  BILITOT 0.3 1.1  PROT 6.7 6.6  ALBUMIN 3.4* 3.5   No results for input(s): LIPASE, AMYLASE in the last 168 hours. No results for input(s): AMMONIA in the last 168 hours. CBC: Recent Labs  Lab 08/30/18 2115 08/31/18 0908  WBC 7.7 7.3  NEUTROABS 4.3  --   HGB 7.0* 8.9*  HCT 25.0* 30.9*  MCV 83.6 85.1  PLT 377 363   Cardiac Enzymes: Recent  Labs  Lab 08/30/18 2115  TROPONINI <0.03   CBG (last 3)  Recent Labs    08/31/18 0102 08/31/18 0740 08/31/18 1115  GLUCAP 184* 195* 184*   No results found for this or any previous visit (from the past 240 hour(s)).   Studies: Dg Chest Portable 1 View  Result Date: 08/30/2018 CLINICAL DATA:  Short of breath EXAM: PORTABLE CHEST 1 VIEW COMPARISON:  07/05/2018 FINDINGS: Mild cardiac enlargement. Negative for heart failure or edema. Mild left lower lobe  airspace disease and small left effusion. Right lung base clear. IMPRESSION: Mild left lower lobe atelectasis/infiltrate and small left effusion. Mild progression since the prior study. Electronically Signed   By: Franchot Gallo M.D.   On: 08/30/2018 21:33     Scheduled Meds: . aspirin EC  81 mg Oral Daily  . atorvastatin  80 mg Oral q morning - 10a  . cholecalciferol  5,000 Units Oral Daily  . docusate sodium  300 mg Oral Daily  . ferrous sulfate  325 mg Oral Q breakfast  . furosemide  60 mg Intravenous BID  . gabapentin  300 mg Oral QID  . Gerhardt's butt cream  1 application Topical TID  . heparin  5,000 Units Subcutaneous Q8H  . insulin aspart  0-5 Units Subcutaneous QHS  . insulin aspart  0-9 Units Subcutaneous TID WC  . insulin detemir  40 Units Subcutaneous QHS  . levothyroxine  200 mcg Oral QAC breakfast  . nystatin   Topical BID  . omega-3 acid ethyl esters  1 g Oral Daily  . pantoprazole  40 mg Oral BID  . potassium chloride  20 mEq Oral BID  . sodium chloride flush  3 mL Intravenous Q12H  . topiramate  150 mg Oral QPM  . umeclidinium-vilanterol  1 puff Inhalation Daily   Continuous Infusions: . sodium chloride      Principal Problem:   Anemia Active Problems:   DM type 2 (diabetes mellitus, type 2) (HCC)   HTN (hypertension), benign   CKD (chronic kidney disease), stage III (HCC)   Hypothyroidism   CAD (coronary artery disease)   Time spent:   Irwin Brakeman, MD [facility] 08/31/2018, 11:42 AM    LOS: 0 days  How to contact the [facility] Attending or Consulting provider [place] or covering provider during after hours [place], for this patient?  1. Check the care team in [facility] and look for a) attending/consulting TRH provider listed and b) the [facility] team listed 2. Log into [email or web address] and use [place]'s universal password to access. If you do not have the password, please contact the hospital operator. 3. Locate the [facility] provider you are looking for under  [facility] and page to a number that you can be directly reached. 4. If you still have difficulty reaching the provider, please page the [facility] (Director on Call) for the Hospitalists listed on amion for assistance.

## 2018-09-01 LAB — CBC
HCT: 33 % — ABNORMAL LOW (ref 36.0–46.0)
Hemoglobin: 9.6 g/dL — ABNORMAL LOW (ref 12.0–15.0)
MCH: 24.7 pg — ABNORMAL LOW (ref 26.0–34.0)
MCHC: 29.1 g/dL — ABNORMAL LOW (ref 30.0–36.0)
MCV: 84.8 fL (ref 80.0–100.0)
Platelets: 407 10*3/uL — ABNORMAL HIGH (ref 150–400)
RBC: 3.89 MIL/uL (ref 3.87–5.11)
RDW: 16.7 % — ABNORMAL HIGH (ref 11.5–15.5)
WBC: 8.7 10*3/uL (ref 4.0–10.5)
nRBC: 0 % (ref 0.0–0.2)

## 2018-09-01 LAB — BPAM RBC
Blood Product Expiration Date: 202004232359
Blood Product Expiration Date: 202004262359
ISSUE DATE / TIME: 202004210028
ISSUE DATE / TIME: 202004210335
Unit Type and Rh: 6200
Unit Type and Rh: 6200

## 2018-09-01 LAB — COMPREHENSIVE METABOLIC PANEL
ALT: 11 U/L (ref 0–44)
AST: 11 U/L — ABNORMAL LOW (ref 15–41)
Albumin: 3.6 g/dL (ref 3.5–5.0)
Alkaline Phosphatase: 98 U/L (ref 38–126)
Anion gap: 9 (ref 5–15)
BUN: 23 mg/dL (ref 8–23)
CO2: 32 mmol/L (ref 22–32)
Calcium: 8.9 mg/dL (ref 8.9–10.3)
Chloride: 98 mmol/L (ref 98–111)
Creatinine, Ser: 1.4 mg/dL — ABNORMAL HIGH (ref 0.44–1.00)
GFR calc Af Amer: 45 mL/min — ABNORMAL LOW (ref >60)
GFR calc non Af Amer: 39 mL/min — ABNORMAL LOW (ref >60)
Glucose, Bld: 238 mg/dL — ABNORMAL HIGH (ref 70–99)
Potassium: 4.2 mmol/L (ref 3.5–5.1)
Sodium: 139 mmol/L (ref 135–145)
Total Bilirubin: 0.7 mg/dL (ref 0.3–1.2)
Total Protein: 7.1 g/dL (ref 6.5–8.1)

## 2018-09-01 LAB — MRSA PCR SCREENING: MRSA by PCR: NEGATIVE

## 2018-09-01 LAB — HEMOGLOBIN A1C
Hgb A1c MFr Bld: 7.5 % — ABNORMAL HIGH (ref 4.8–5.6)
Mean Plasma Glucose: 169 mg/dL

## 2018-09-01 LAB — GLUCOSE, CAPILLARY
Glucose-Capillary: 212 mg/dL — ABNORMAL HIGH (ref 70–99)
Glucose-Capillary: 239 mg/dL — ABNORMAL HIGH (ref 70–99)
Glucose-Capillary: 251 mg/dL — ABNORMAL HIGH (ref 70–99)
Glucose-Capillary: 202 mg/dL — ABNORMAL HIGH (ref 70–99)
Glucose-Capillary: 279 mg/dL — ABNORMAL HIGH (ref 70–99)

## 2018-09-01 LAB — TYPE AND SCREEN
ABO/RH(D): A POS
Antibody Screen: NEGATIVE
Unit division: 0
Unit division: 0

## 2018-09-01 MED ORDER — POLYETHYLENE GLYCOL 3350 17 G PO PACK: 17.0000 g | PACK | Freq: Every day | ORAL | Status: DC | PRN

## 2018-09-01 MED ORDER — INSULIN ASPART 100 UNIT/ML ~~LOC~~ SOLN
10.0000 [IU] | Freq: Three times a day (TID) | SUBCUTANEOUS | Status: DC
  Administered 2018-09-01 – 2018-09-07 (×18): 10 [IU] via SUBCUTANEOUS

## 2018-09-01 MED ORDER — BISACODYL 5 MG PO TBEC
10.0000 mg | DELAYED_RELEASE_TABLET | Freq: Every day | ORAL | Status: DC | PRN
  Administered 2018-09-01 – 2018-09-06 (×4): 10 mg via ORAL
  Filled 2018-09-01 (×4): qty 2

## 2018-09-01 NOTE — Progress Notes (Signed)
Inpatient Diabetes Program Recommendations  AACE/ADA: New Consensus Statement on Inpatient Glycemic Control   Target Ranges:  Prepandial:   less than 140 mg/dL      Peak postprandial:   less than 180 mg/dL (1-2 hours)      Critically ill patients:  140 - 180 mg/dL   Results for ILITHYIA, TITZER (MRN [id number]) as of 09/01/2018 08:15  Ref. Range 08/31/18 01:35 08/31/2018 07:40 08/31/2018 11:15 08/31/2018 16:16 08/31/2018 20:16 09/01/2018 04:26 09/01/2018 07:28  Glucose-Capillary Latest Ref Range: 70 - 99 mg/dL   Levemir 40 units @ 1:35 195 (H)  Novolog 2 units 184 (H)  Novolog 2 units 195 (H)  Novolog 2 units 189 (H)   Levemir 30 units 212 (H) 239 (H)   Review of Glycemic Control  Diabetes history: DM2 Outpatient Diabetes medications: Levemir 40 units QHS, Novolog 1-20 units TID with meals Current orders for Inpatient glycemic control: Levemir 30 units QHS, Novolog 0-9 units TID with meals, Novolog 0-5 units QHS  Inpatient Diabetes Program Recommendations:   Insulin - Basal: Please consider increasing Levemir to 40 units QHS.  Insulin - Meal Coverage: Please consider ordering Novolog 3 units TID with meals for meal coverage if patient eats at least 50% of meals.  NOTE: In reviewing chart, noted Levemir was decreased to 30 units on 08/31/18 (was 40 units). Patient received Levemir 30 units last night and fasting glucose is 239 mg/dl today. Would recommend increasing Levemir back up to 40 units QHS and adding Novolog 3 units TID with meals for meal coverage.  Thanks, Barnie Alderman, RN, MSN, CDE Diabetes Coordinator Inpatient Diabetes Program [phone] (Team Pager from 8am to 5pm)

## 2018-09-01 NOTE — Progress Notes (Signed)
PROGRESS NOTE  Stephanie Combs  JQZ:[id number]  DOB: 01-Apr-1951  DOA: 08/30/2018 PCP: [name], MD  Brief Admission Hx: 68 year old female with chronic diastolic CHF, CAD, type 2 diabetes mellitus, hypothyroidism, chronic lymphedema presented with progressive shortness of breath and progressive edema in the lower extremities not responding to oral Lasix.  MDM/Assessment & Plan:   1. Symptomatic anemia-patient is status post 2 units packed red blood cells and hemoglobin up to 9.6.  Continue supplemental iron. 2. Chronic lymphedema- patient reports progressive edema despite taking oral Lasix.  She is on IV Lasix and diuresing well.  Continue IV Lasix for now. 3. CAD status post DES-continue cardiac medications Lipitor and aspirin. 4. Type 2 diabetes mellitus with vascular complications- she is having some hyperglycemia after meals, added NovoLog meal coverage, continue Levemir 30 units daily.  Continue supplemental sliding scale coverage. 5. Stage III CKD- creatinine holding stable in setting of IV diuresis.  Continue to monitor. 6. Hypothyroidism- continue oral levothyroxine.  TSH within normal limits. 7. GERD-Protonix ordered for GI protection. 8. Severe peripheral diabetic polyneuropathy-she has been restarted on gabapentin 300 mg 4 times daily.  This is a reduction from her home dosing as she has some renal insufficiency. 9. COPD- she is currently asymptomatic and has been stable on home bronchodilators.  DVT prophylaxis: Lovenox Code Status: DNR Family Communication: Patient updated at bedside. Disposition Plan: Continue IV diuresis for now.   Consultants:  N/A  Procedures:  N/A  Antimicrobials:  N/A  Subjective: Patient reports that she continues to feel very fatigued.  She has not been ambulating out of the bed.  Objective: Vitals:   08/31/18 2124 08/31/18 2141 09/01/18 0430 09/01/18 0829  BP:  138/71 125/74   Pulse:  100 74   Resp:  16 16   Temp:  98.3 F  (36.8 C)    TempSrc:  Oral    SpO2: 97% 96% 97% 98%  Weight:   (!) 138.1 kg   Height:        Intake/Output Summary (Last 24 hours) at 09/01/2018 1214 Last data filed at 09/01/2018 1149 Gross per 24 hour  Intake 720 ml  Output 5150 ml  Net -4430 ml   Filed Weights   08/30/18 2305 08/31/18 0500 09/01/18 0430  Weight: (!) 139.7 kg (!) 140.6 kg (!) 138.1 kg     REVIEW OF SYSTEMS  As per history otherwise all reviewed and reported negative  Exam:  General exam: Awake, alert, no apparent distress, cooperative and pleasant. Respiratory system: Bilateral breath sounds clear to auscultation no crackles or wheezes heard. No increased work of breathing. Cardiovascular system: Normal S1 & S2 heard.  2+ pitting edema bilateral lower extremities. Gastrointestinal system: Abdomen is nondistended, soft and nontender. Normal bowel sounds heard. Central nervous system: Alert and oriented. No focal neurological deficits. Extremities: 2+ edema bilateral lower extremities.  Data Reviewed: Basic Metabolic Panel: Recent Labs  Lab 08/30/18 2115 08/31/18 0908 09/01/18 0619  NA 138 138 139  K 4.2 3.9 4.2  CL 102 101 98  CO2 27 28 32  GLUCOSE 241* 213* 238*  BUN 23 22 23   CREATININE 1.58* 1.46* 1.40*  CALCIUM 8.3* 8.4* 8.9   Liver Function Tests: Recent Labs  Lab 08/30/18 2115 08/31/18 0908 09/01/18 0619  AST 13* 11* 11*  ALT 11 11 11   ALKPHOS 92 92 98  BILITOT 0.3 1.1 0.7  PROT 6.7 6.6 7.1  ALBUMIN 3.4* 3.5 3.6   No results for input(s): LIPASE, AMYLASE in the  last 168 hours. No results for input(s): AMMONIA in the last 168 hours. CBC: Recent Labs  Lab 08/30/18 2115 08/31/18 0908 09/01/18 0619  WBC 7.7 7.3 8.7  NEUTROABS 4.3  --   --   HGB 7.0* 8.9* 9.6*  HCT 25.0* 30.9* 33.0*  MCV 83.6 85.1 84.8  PLT 377 363 407*   Cardiac Enzymes: Recent Labs  Lab 08/30/18 2115  TROPONINI <0.03   CBG (last 3)  Recent Labs    09/01/18 0426 09/01/18 0728 09/01/18 1121   GLUCAP 212* 239* 251*   No results found for this or any previous visit (from the past 240 hour(s)).   Studies: Dg Chest Portable 1 View  Result Date: 08/30/2018 CLINICAL DATA:  Short of breath EXAM: PORTABLE CHEST 1 VIEW COMPARISON:  07/05/2018 FINDINGS: Mild cardiac enlargement. Negative for heart failure or edema. Mild left lower lobe airspace disease and small left effusion. Right lung base clear. IMPRESSION: Mild left lower lobe atelectasis/infiltrate and small left effusion. Mild progression since the prior study. Electronically Signed   By: Franchot Gallo M.D.   On: 08/30/2018 21:33   Scheduled Meds: . aspirin EC  81 mg Oral Daily  . atorvastatin  80 mg Oral q morning - 10a  . cholecalciferol  5,000 Units Oral Daily  . docusate sodium  300 mg Oral Daily  . ferrous sulfate  325 mg Oral Q breakfast  . furosemide  40 mg Intravenous BID  . gabapentin  300 mg Oral QID  . Gerhardt's butt cream  1 application Topical TID  . heparin  5,000 Units Subcutaneous Q8H  . insulin aspart  0-5 Units Subcutaneous QHS  . insulin aspart  0-9 Units Subcutaneous TID WC  . insulin aspart  10 Units Subcutaneous TID WC  . insulin detemir  30 Units Subcutaneous QHS  . levothyroxine  200 mcg Oral QAC breakfast  . nystatin   Topical BID  . omega-3 acid ethyl esters  1 g Oral Daily  . pantoprazole  40 mg Oral BID  . potassium chloride  20 mEq Oral BID  . sodium chloride flush  3 mL Intravenous Q12H  . topiramate  150 mg Oral QPM  . umeclidinium-vilanterol  1 puff Inhalation Daily   Continuous Infusions: . sodium chloride      Principal Problem:   Anemia Active Problems:   DM type 2 (diabetes mellitus, type 2) (HCC)   HTN (hypertension), benign   CKD (chronic kidney disease), stage III (HCC)   Hypothyroidism   CAD (coronary artery disease)   Symptomatic anemia  Time spent:   Irwin Brakeman, MD [facility] 09/01/2018, 12:14 PM    LOS: 1 day  How to contact the [facility] Attending or  Consulting provider [place] or covering provider during after hours [place], for this patient?  1. Check the care team in [facility] and look for a) attending/consulting TRH provider listed and b) the [facility] team listed 2. Log into [email or web address] and use [place]'s universal password to access. If you do not have the password, please contact the hospital operator. 3. Locate the [facility] provider you are looking for under [facility] and page to a number that you can be directly reached. 4. If you still have difficulty reaching the provider, please page the [facility] (Director on Call) for the Hospitalists listed on amion for assistance.

## 2018-09-01 NOTE — Care Management Important Message (Signed)
Important Message  Patient Details  Name: Stephanie Combs MRN: [id number] Date of Birth: [birth year]   Medicare Important Message Given:  Yes    Tommy Medal 09/01/2018, 11:23 AM

## 2018-09-02 LAB — COMPREHENSIVE METABOLIC PANEL
ALT: 10 U/L (ref 0–44)
AST: 12 U/L — ABNORMAL LOW (ref 15–41)
Albumin: 3.2 g/dL — ABNORMAL LOW (ref 3.5–5.0)
Alkaline Phosphatase: 97 U/L (ref 38–126)
Anion gap: 9 (ref 5–15)
BUN: 29 mg/dL — ABNORMAL HIGH (ref 8–23)
CO2: 30 mmol/L (ref 22–32)
Calcium: 8.7 mg/dL — ABNORMAL LOW (ref 8.9–10.3)
Chloride: 98 mmol/L (ref 98–111)
Creatinine, Ser: 1.18 mg/dL — ABNORMAL HIGH (ref 0.44–1.00)
GFR calc Af Amer: 55 mL/min — ABNORMAL LOW (ref >60)
GFR calc non Af Amer: 47 mL/min — ABNORMAL LOW (ref >60)
Glucose, Bld: 102 mg/dL — ABNORMAL HIGH (ref 70–99)
Potassium: 4.1 mmol/L (ref 3.5–5.1)
Sodium: 137 mmol/L (ref 135–145)
Total Bilirubin: 0.6 mg/dL (ref 0.3–1.2)
Total Protein: 5.8 g/dL — ABNORMAL LOW (ref 6.5–8.1)

## 2018-09-02 LAB — GLUCOSE, CAPILLARY
Glucose-Capillary: 101 mg/dL — ABNORMAL HIGH (ref 70–99)
Glucose-Capillary: 99 mg/dL (ref 70–99)
Glucose-Capillary: 136 mg/dL — ABNORMAL HIGH (ref 70–99)
Glucose-Capillary: 165 mg/dL — ABNORMAL HIGH (ref 70–99)
Glucose-Capillary: 193 mg/dL — ABNORMAL HIGH (ref 70–99)

## 2018-09-02 MED ORDER — FLEET ENEMA 7-19 GM/118ML RE ENEM
1.0000 | ENEMA | Freq: Once | RECTAL | Status: AC
  Administered 2018-09-02: 1 via RECTAL

## 2018-09-02 MED ORDER — ALPRAZOLAM 1 MG PO TABS
1.0000 mg | ORAL_TABLET | Freq: Three times a day (TID) | ORAL | Status: DC | PRN
  Administered 2018-09-02 – 2018-09-18 (×35): 1 mg via ORAL
  Filled 2018-09-02: qty 2
  Filled 2018-09-02 (×2): qty 1
  Filled 2018-09-02: qty 2
  Filled 2018-09-02: qty 1
  Filled 2018-09-02: qty 2
  Filled 2018-09-02: qty 1
  Filled 2018-09-02 (×2): qty 2
  Filled 2018-09-02: qty 1
  Filled 2018-09-02: qty 2
  Filled 2018-09-02 (×3): qty 1
  Filled 2018-09-02 (×2): qty 2
  Filled 2018-09-02 (×6): qty 1
  Filled 2018-09-02 (×2): qty 2
  Filled 2018-09-02: qty 1
  Filled 2018-09-02 (×3): qty 2
  Filled 2018-09-02 (×4): qty 1
  Filled 2018-09-02 (×3): qty 2
  Filled 2018-09-02: qty 1

## 2018-09-02 MED ORDER — POLYETHYLENE GLYCOL 3350 17 G PO PACK
17.0000 g | PACK | Freq: Two times a day (BID) | ORAL | Status: DC
  Administered 2018-09-02 – 2018-09-22 (×37): 17 g via ORAL
  Filled 2018-09-02 (×39): qty 1

## 2018-09-02 NOTE — Progress Notes (Signed)
PROGRESS NOTE  Stephanie Combs  [id number]:[id number]  DOB: August 01, 1950  DOA: 08/30/2018 PCP: [name], MD  Brief Admission Hx: 68 year old female with chronic lymphedema, chronic diastolic congestive heart failure, type 2 diabetes mellitus, hypothyroidism, chronic back pain presented with progressive shortness of breath, progressive edema in the lower extremities not responding to oral Lasix.  She required IV Lasix for diuresis.  MDM/Assessment & Plan:   1. Symptomatic anemia-patient is status post 2 units packed red blood cells and hemoglobin greater than 9.  Continue supplemental iron. 2. Chronic lymphedema-patient is slowly improving with IV Lasix.  She did not respond well to oral Lasix and thus was admitted for IV treatment. 3. Acute diastolic CHF exacerbation-patient is diuresing well.  Her weight is trending down and she is -7 L since admission.  Her renal function is holding stable and we will continue IV diuresis today. 4. Coronary artery disease status post DES-continue cardiac medications Lipitor and aspirin. 5. Type 2 diabetes mellitus with vascular complications- postprandial hyperglycemia is improving after adding prandial NovoLog. 6. Stage III CKD-creatinine holding stable in the setting of IV diuresis but we continue to monitor very closely and likely will discontinue IV diuresis if her creatinine starts to bump. 7. Hypothyroidism-continue home levothyroxine.  Her TSH is within normal limits. 8. GERD-Protonix ordered for GI protection. 9. Severe peripheral diabetic polyneuropathy-he is restarted on gabapentin 300 mg 4 times daily. 10. GAD-patient is very unhappy about her alprazolam dose being reduced to 0.5 mg.  Now that her renal function has improved we will resume her home 1 mg alprazolam dose. 11. COPD- stable, asymptomatic and we have resumed home bronchodilators.  DVT prophylaxis: Lovenox Code Status: DNR Family Communication: Patient updated at bedside did not request for  other to be updated. Disposition Plan: Continue IV diuresis, plan PT evaluation 09/03/2018.  Consultants:  N/A  Procedures:  N/A  Antimicrobials:  N/A  Subjective: Patient again has multiple complaints today but is very unhappy that her alprazolam dose was reduced to 0.5 mg when she was admitted.  Explained to patient that this was done due to her worsening renal failure and now that her renal function is improving we can resume her regular home dose.  She is diuresing very well.  She is noticing some improvement in the edema of the legs.  She denies chest pain and shortness of breath.  Objective: Vitals:   09/01/18 2107 09/02/18 0531 09/02/18 0843 09/02/18 1345  BP: 139/71 (!) 125/44  (!) 112/54  Pulse: 79 83  79  Resp: 18 18  18   Temp: 98.8 F (37.1 C) 99 F (37.2 C)  98.6 F (37 C)  TempSrc: Oral Oral  Oral  SpO2: 99% 98% 94% 100%  Weight:  (!) 138.8 kg    Height:        Intake/Output Summary (Last 24 hours) at 09/02/2018 1411 Last data filed at 09/02/2018 1231 Gross per 24 hour  Intake -  Output 3150 ml  Net -3150 ml   Filed Weights   08/31/18 0500 09/01/18 0430 09/02/18 0531  Weight: (!) 140.6 kg (!) 138.1 kg (!) 138.8 kg   REVIEW OF SYSTEMS  As per history otherwise all reviewed and reported negative  Exam:  General exam: Awake, alert, no apparent distress, cooperative and pleasant. Respiratory system: Clear. No increased work of breathing. Cardiovascular system: S1 & S2 heard.  2+ pedal edema bilateral. Gastrointestinal system: Abdomen is nondistended, soft and nontender. Normal bowel sounds heard. Central nervous system: Alert and oriented.  No focal neurological deficits. Extremities: 2+ pitting edema bilateral lower extremities.  Data Reviewed: Basic Metabolic Panel: Recent Labs  Lab 08/30/18 2115 08/31/18 0908 09/01/18 0619 09/02/18 0500  NA 138 138 139 137  K 4.2 3.9 4.2 4.1  CL 102 101 98 98  CO2 27 28 32 30  GLUCOSE 241* 213* 238* 102*   BUN 23 22 23  29*  CREATININE 1.58* 1.46* 1.40* 1.18*  CALCIUM 8.3* 8.4* 8.9 8.7*   Liver Function Tests: Recent Labs  Lab 08/30/18 2115 08/31/18 0908 09/01/18 0619 09/02/18 0500  AST 13* 11* 11* 12*  ALT 11 11 11 10   ALKPHOS 92 92 98 97  BILITOT 0.3 1.1 0.7 0.6  PROT 6.7 6.6 7.1 5.8*  ALBUMIN 3.4* 3.5 3.6 3.2*   No results for input(s): LIPASE, AMYLASE in the last 168 hours. No results for input(s): AMMONIA in the last 168 hours. CBC: Recent Labs  Lab 08/30/18 2115 08/31/18 0908 09/01/18 0619  WBC 7.7 7.3 8.7  NEUTROABS 4.3  --   --   HGB 7.0* 8.9* 9.6*  HCT 25.0* 30.9* 33.0*  MCV 83.6 85.1 84.8  PLT 377 363 407*   Cardiac Enzymes: Recent Labs  Lab 08/30/18 2115  TROPONINI <0.03   CBG (last 3)  Recent Labs    09/02/18 0456 09/02/18 0726 09/02/18 1147  GLUCAP 101* 99 136*   Recent Results (from the past 240 hour(s))  MRSA PCR Screening     Status: None   Collection Time: 09/01/18  6:32 PM  Result Value Ref Range Status   MRSA by PCR NEGATIVE NEGATIVE Final    Comment:        The GeneXpert MRSA Assay (FDA approved for NASAL specimens only), is one component of a comprehensive MRSA colonization surveillance program. It is not intended to diagnose MRSA infection nor to guide or monitor treatment for MRSA infections. Performed at [facility], [address] [place], [place] [address]    Studies: No results found.  Scheduled Meds: . aspirin EC  81 mg Oral Daily  . atorvastatin  80 mg Oral q morning - 10a  . cholecalciferol  5,000 Units Oral Daily  . ferrous sulfate  325 mg Oral Q breakfast  . furosemide  40 mg Intravenous BID  . gabapentin  300 mg Oral QID  . Gerhardt's butt cream  1 application Topical TID  . heparin  5,000 Units Subcutaneous Q8H  . insulin aspart  0-5 Units Subcutaneous QHS  . insulin aspart  0-9 Units Subcutaneous TID WC  . insulin aspart  10 Units Subcutaneous TID WC  . insulin detemir  30 Units Subcutaneous QHS  .  levothyroxine  200 mcg Oral QAC breakfast  . nystatin   Topical BID  . omega-3 acid ethyl esters  1 g Oral Daily  . pantoprazole  40 mg Oral BID  . polyethylene glycol  17 g Oral BID  . potassium chloride  20 mEq Oral BID  . sodium chloride flush  3 mL Intravenous Q12H  . topiramate  150 mg Oral QPM  . umeclidinium-vilanterol  1 puff Inhalation Daily   Continuous Infusions: . sodium chloride      Principal Problem:   Anemia Active Problems:   DM type 2 (diabetes mellitus, type 2) (HCC)   HTN (hypertension), benign   CKD (chronic kidney disease), stage III (HCC)   Hypothyroidism   CAD (coronary artery disease)   Symptomatic anemia  Time spent:   Irwin Brakeman, MD [facility] 09/02/2018,  2:11 PM    LOS: 2 days  How to contact the [facility] Attending or Consulting provider [place] or covering provider during after hours [place], for this patient?  1. Check the care team in [facility] and look for a) attending/consulting TRH provider listed and b) the [facility] team listed 2. Log into [email or web address] and use [place]'s universal password to access. If you do not have the password, please contact the hospital operator. 3. Locate the [facility] provider you are looking for under [facility] and page to a number that you can be directly reached. 4. If you still have difficulty reaching the provider, please page the [facility] (Director on Call) for the Hospitalists listed on amion for assistance.

## 2018-09-03 LAB — CBC
HCT: 29 % — ABNORMAL LOW (ref 36.0–46.0)
Hemoglobin: 8.1 g/dL — ABNORMAL LOW (ref 12.0–15.0)
MCH: 24 pg — ABNORMAL LOW (ref 26.0–34.0)
MCHC: 27.9 g/dL — ABNORMAL LOW (ref 30.0–36.0)
MCV: 86.1 fL (ref 80.0–100.0)
Platelets: 347 10*3/uL (ref 150–400)
RBC: 3.37 MIL/uL — ABNORMAL LOW (ref 3.87–5.11)
RDW: 17.7 % — ABNORMAL HIGH (ref 11.5–15.5)
WBC: 7.1 10*3/uL (ref 4.0–10.5)
nRBC: 0 % (ref 0.0–0.2)

## 2018-09-03 LAB — GLUCOSE, CAPILLARY
Glucose-Capillary: 212 mg/dL — ABNORMAL HIGH (ref 70–99)
Glucose-Capillary: 151 mg/dL — ABNORMAL HIGH (ref 70–99)
Glucose-Capillary: 164 mg/dL — ABNORMAL HIGH (ref 70–99)
Glucose-Capillary: 175 mg/dL — ABNORMAL HIGH (ref 70–99)
Glucose-Capillary: 180 mg/dL — ABNORMAL HIGH (ref 70–99)

## 2018-09-03 LAB — RENAL FUNCTION PANEL
Albumin: 2.9 g/dL — ABNORMAL LOW (ref 3.5–5.0)
Anion gap: 8 (ref 5–15)
BUN: 34 mg/dL — ABNORMAL HIGH (ref 8–23)
CO2: 29 mmol/L (ref 22–32)
Calcium: 8.4 mg/dL — ABNORMAL LOW (ref 8.9–10.3)
Chloride: 100 mmol/L (ref 98–111)
Creatinine, Ser: 1.4 mg/dL — ABNORMAL HIGH (ref 0.44–1.00)
GFR calc Af Amer: 45 mL/min — ABNORMAL LOW (ref >60)
GFR calc non Af Amer: 39 mL/min — ABNORMAL LOW (ref >60)
Glucose, Bld: 193 mg/dL — ABNORMAL HIGH (ref 70–99)
Phosphorus: 4.6 mg/dL (ref 2.5–4.6)
Potassium: 3.9 mmol/L (ref 3.5–5.1)
Sodium: 137 mmol/L (ref 135–145)

## 2018-09-03 MED ORDER — FUROSEMIDE 10 MG/ML IJ SOLN
40.0000 mg | Freq: Every day | INTRAMUSCULAR | Status: DC
  Administered 2018-09-03 – 2018-09-06 (×4): 40 mg via INTRAVENOUS
  Filled 2018-09-03 (×4): qty 4

## 2018-09-03 NOTE — Plan of Care (Signed)
  Problem: Acute Rehab PT Goals(only PT should resolve) Goal: Pt Will Go Supine/Side To Sit Outcome: Progressing Flowsheets (Taken 09/03/2018 1037) Pt will go Supine/Side to Sit: with modified independence Goal: Patient Will Transfer Sit To/From Stand Outcome: Progressing Flowsheets (Taken 09/03/2018 1037) Patient will transfer sit to/from stand: with modified independence Goal: Pt Will Transfer Bed To Chair/Chair To Bed Outcome: Progressing Flowsheets (Taken 09/03/2018 1037) Pt will Transfer Bed to Chair/Chair to Bed: with modified independence Goal: Pt Will Ambulate Outcome: Progressing Flowsheets (Taken 09/03/2018 1037) Pt will Ambulate: 25 feet; with rolling walker; with supervision   10:37 AM, 09/03/18 Lonell Grandchild, MPT Physical Therapist with [facility] [phone] office [phone] mobile phone

## 2018-09-03 NOTE — Progress Notes (Signed)
PROGRESS NOTE    Stephanie Combs  [id number]:[id number]  DOB: 1951/01/31  DOA: 08/30/2018 PCP: [name], MD   Brief Admission Hx: 68 year old female with chronic lymphedema, chronic diastolic CHF, type 2 diabetes mellitus, hypothyroidism, chronic back pain presented with progressive lower extremity edema and shortness of breath not responding to oral Lasix.  She has required IV Lasix for diuresis.  MDM/Assessment & Plan:   1. Severe iron deficiency anemia/symptomatic anemia- patient is status post 2 units packed red blood cells.  Hemoglobin has improved posttransfusion.  Hemoccult stool pending, continue iron supplement. 2. Chronic lymphedema-patient is slowly improving with IV Lasix treatments. 3. Acute diastolic CHF exacerbation-patient continues to diurese well.  Her weight is slowly trending down and she has diuresed 9.5 L since admission. 4. CAD status post DES- continue cardiac medications Lipitor and aspirin. 5. Type 2 diabetes mellitus with vascular complications-patient has better control postprandial hyperglycemia with the addition of prandial NovoLog coverage.  Continue supplemental sliding scale coverage. 6. Stage III CKD-her creatinine is being monitored closely in the setting of IV diuresis but it has been stable and will continue to monitor.  I have reduced her IV Lasix to 40 mg once daily. 7. Hypothyroidism-her TSH is within normal limits and she has been resumed on home levothyroxine. 8. GERD-Protonix ordered for GI protection. 9. Severe peripheral diabetic polyneuropathy- restarted home gabapentin which controls symptoms. 10. GAD-patient has been resumed on home alprazolam. 11. COPD- stable and we have resumed home bronchodilators.  DVT prophylaxis: Lovenox Code Status: Full Family Communication: Patient updated at bedside Disposition Plan: Continue IV diuresis, PT is recommending home health PT   Consultants:  N/A  Procedures:  N/A  Antimicrobials:  N/A   Subjective: The patient reports that she has had some body aches.  She is getting up to urinate.  She had a small bowel movement.  She remains constipated.  She is diuresing well.  She denies chest pain and shortness of breath however she remains very weak.  Objective: Vitals:   09/03/18 0500 09/03/18 0625 09/03/18 0835 09/03/18 1359  BP:  (!) 119/53  116/68  Pulse:  82  85  Resp:  16  (!) 2  Temp:  98.1 F (36.7 C)  98.4 F (36.9 C)  TempSrc:  Oral  Oral  SpO2:  96% 95% 98%  Weight: (!) 136.6 kg     Height:        Intake/Output Summary (Last 24 hours) at 09/03/2018 1616 Last data filed at 09/03/2018 1300 Gross per 24 hour  Intake 1520 ml  Output 2750 ml  Net -1230 ml   Filed Weights   09/01/18 0430 09/02/18 0531 09/03/18 0500  Weight: (!) 138.1 kg (!) 138.8 kg (!) 136.6 kg   CBG (last 3)  Recent Labs    09/03/18 0356 09/03/18 0746 09/03/18 1117  GLUCAP 212* 151* 164*     REVIEW OF SYSTEMS  As per history otherwise all reviewed and reported negative  Exam:  General exam: Awake, alert, sitting up in the bed in no apparent distress and cooperative. Respiratory system:  No increased work of breathing. Cardiovascular system: S1 & S2 heard.  2+ edema bilateral lower extremities. Gastrointestinal system: Abdomen is nondistended, soft and nontender. Normal bowel sounds heard. Central nervous system: Alert and oriented. No focal neurological deficits. Extremities: 2+ pitting edema bilateral lower extremities.  Data Reviewed: Basic Metabolic Panel: Recent Labs  Lab 08/30/18 2115 08/31/18 0908 09/01/18 0619 09/02/18 0500 09/03/18 0502  NA 138 138 139  137 137  K 4.2 3.9 4.2 4.1 3.9  CL 102 101 98 98 100  CO2 27 28 32 30 29  GLUCOSE 241* 213* 238* 102* 193*  BUN 23 22 23  29* 34*  CREATININE 1.58* 1.46* 1.40* 1.18* 1.40*  CALCIUM 8.3* 8.4* 8.9 8.7* 8.4*  PHOS  --   --   --   --  4.6   Liver Function Tests: Recent Labs  Lab 08/30/18 2115 08/31/18 0908  09/01/18 0619 09/02/18 0500 09/03/18 0502  AST 13* 11* 11* 12*  --   ALT 11 11 11 10   --   ALKPHOS 92 92 98 97  --   BILITOT 0.3 1.1 0.7 0.6  --   PROT 6.7 6.6 7.1 5.8*  --   ALBUMIN 3.4* 3.5 3.6 3.2* 2.9*   No results for input(s): LIPASE, AMYLASE in the last 168 hours. No results for input(s): AMMONIA in the last 168 hours. CBC: Recent Labs  Lab 08/30/18 2115 08/31/18 0908 09/01/18 0619 09/03/18 0502  WBC 7.7 7.3 8.7 7.1  NEUTROABS 4.3  --   --   --   HGB 7.0* 8.9* 9.6* 8.1*  HCT 25.0* 30.9* 33.0* 29.0*  MCV 83.6 85.1 84.8 86.1  PLT 377 363 407* 347   Cardiac Enzymes: Recent Labs  Lab 08/30/18 2115  TROPONINI <0.03   CBG (last 3)  Recent Labs    09/03/18 0356 09/03/18 0746 09/03/18 1117  GLUCAP 212* 151* 164*   Recent Results (from the past 240 hour(s))  MRSA PCR Screening     Status: None   Collection Time: 09/01/18  6:32 PM  Result Value Ref Range Status   MRSA by PCR NEGATIVE NEGATIVE Final    Comment:        The GeneXpert MRSA Assay (FDA approved for NASAL specimens only), is one component of a comprehensive MRSA colonization surveillance program. It is not intended to diagnose MRSA infection nor to guide or monitor treatment for MRSA infections. Performed at [facility], [address], [place], [place] [address]      Studies: No results found.   Scheduled Meds: . aspirin EC  81 mg Oral Daily  . atorvastatin  80 mg Oral q morning - 10a  . cholecalciferol  5,000 Units Oral Daily  . ferrous sulfate  325 mg Oral Q breakfast  . furosemide  40 mg Intravenous Daily  . gabapentin  300 mg Oral QID  . Gerhardt's butt cream  1 application Topical TID  . heparin  5,000 Units Subcutaneous Q8H  . insulin aspart  0-5 Units Subcutaneous QHS  . insulin aspart  0-9 Units Subcutaneous TID WC  . insulin aspart  10 Units Subcutaneous TID WC  . insulin detemir  30 Units Subcutaneous QHS  . levothyroxine  200 mcg Oral QAC breakfast  . nystatin   Topical  BID  . omega-3 acid ethyl esters  1 g Oral Daily  . pantoprazole  40 mg Oral BID  . polyethylene glycol  17 g Oral BID  . potassium chloride  20 mEq Oral BID  . sodium chloride flush  3 mL Intravenous Q12H  . topiramate  150 mg Oral QPM  . umeclidinium-vilanterol  1 puff Inhalation Daily   Continuous Infusions: . sodium chloride      Principal Problem:   Anemia Active Problems:   DM type 2 (diabetes mellitus, type 2) (HCC)   HTN (hypertension), benign   CKD (chronic kidney disease), stage III (HCC)   Hypothyroidism  CAD (coronary artery disease)   Symptomatic anemia   Time spent:   Irwin Brakeman, MD [facility] 09/03/2018, 4:16 PM    LOS: 3 days  How to contact the [facility] Attending or Consulting provider [place] or covering provider during after hours [place], for this patient?  1. Check the care team in [facility] and look for a) attending/consulting TRH provider listed and b) the [facility] team listed 2. Log into [email or web address] and use 's universal password to access. If you do not have the password, please contact the hospital operator. 3. Locate the [facility] provider you are looking for under [facility] and page to a number that you can be directly reached. 4. If you still have difficulty reaching the provider, please page the [facility] (Director on Call) for the Hospitalists listed on amion for assistance.

## 2018-09-03 NOTE — Care Management Important Message (Signed)
Important Message  Patient Details  Name: Stephanie Combs MRN: [id number] Date of Birth: 1950-08-26   Medicare Important Message Given:  Yes    Tommy Medal 09/03/2018, 2:50 PM

## 2018-09-03 NOTE — Evaluation (Signed)
Physical Therapy Evaluation Patient Details Name: Stephanie Combs MRN: [id number] DOB: 1950/08/20 Today's Date: 09/03/2018   History of Present Illness  Stephanie Combs  is a 68 y.o. female, w CAD s/p DES prox LAD, CHF (EF 60-65%), Dm2, CKD stage3, Hypertension, Hypothyroidism, Chronic lymphedema, apparently presents with c/o dyspnea for the past 5 days, along with increase in lower ext edema, refractory to increase in oral lasix. Slight mostly dry cough.  Pt denies fever, chills, cp, palp, abd pain, n/v, diarrhea,  Brbpr.  Pt is taking iron for her chronic anemia. Pt notes having had prior colonoscopy, normal.  Hx of diverticulosis.    Clinical Impression  Patient functioning near baseline for functional mobility and gait, limited to a few steps to transfer to chair secondary to c/o fatigue and constant chest pain and declined to ambulate away from bedside.  Patient tolerated sitting up in chair after therapy - nursing staff notified.  Patient will benefit from continued physical therapy in hospital and recommended venue below to increase strength, balance, endurance for safe ADLs and gait.     Follow Up Recommendations Home health PT;Supervision for mobility/OOB;Supervision - Intermittent    Equipment Recommendations  None recommended by PT    Recommendations for Other Services       Precautions / Restrictions Precautions Precautions: Fall Restrictions Weight Bearing Restrictions: No      Mobility  Bed Mobility Overal bed mobility: Modified Independent             General bed mobility comments: increased time, use of bedrail  Transfers Overall transfer level: Needs assistance Equipment used: 1 person hand held assist;None Transfers: Sit to/from [facility] Sit to Stand: Supervision Stand pivot transfers: Min guard       General transfer comment: slow labored movement using armrest of chair  Ambulation/Gait Ambulation/Gait assistance: Min guard Gait  Distance (Feet): 3 Feet Assistive device: None Gait Pattern/deviations: Decreased step length - right;Decreased step length - left;Decreased stride length Gait velocity: slow   General Gait Details: limited to 3-4 slow labored steps at bedside due to c/o fatigue  Stairs            Wheelchair Mobility    Modified Rankin (Stroke Patients Only)       Balance Overall balance assessment: Needs assistance Sitting-balance support: Feet supported;No upper extremity supported Sitting balance-Leahy Scale: Good     Standing balance support: During functional activity;Single extremity supported Standing balance-Leahy Scale: Fair Standing balance comment: leaning on bedrail and armrest of chair for support                             Pertinent Vitals/Pain Pain Assessment: Faces Faces Pain Scale: Hurts little more Pain Location: chest pain due to difficulty breathing Pain Descriptors / Indicators: Discomfort;Pressure Pain Intervention(s): Limited activity within patient's tolerance;Monitored during session    [place] expects to be discharged to:: Private residence Living Arrangements: Alone Available Help at Discharge: Family;Friend(s);Available PRN/intermittently Type of Home: Mobile home Home Access: Ramped entrance     Home Layout: One level Home Equipment: [place] - 2 wheels;Cane - single point;Shower seat;Bedside commode;Wheelchair - manual      Prior Function Level of Independence: Independent with assistive device(s)         Comments: household gait with RW PRN, uses RW for longer distances     Hand Dominance   Dominant Hand: Right    Extremity/Trunk Assessment   Upper Extremity Assessment  Upper Extremity Assessment: Generalized weakness    Lower Extremity Assessment Lower Extremity Assessment: Generalized weakness    Cervical / Trunk Assessment Cervical / Trunk Assessment: Normal  Communication   Communication: No  difficulties  Cognition Arousal/Alertness: Awake/alert Behavior During Therapy: WFL for tasks assessed/performed Overall Cognitive Status: Within Functional Limits for tasks assessed                                        General Comments      Exercises     Assessment/Plan    PT Assessment Patient needs continued PT services  PT Problem List Decreased strength;Decreased activity tolerance;Decreased balance;Pain       PT Treatment Interventions Therapeutic exercise;Gait training;Stair training;Functional mobility training;Therapeutic activities;Patient/family education    PT Goals (Current goals can be found in the Care Plan section)  Acute Rehab PT Goals Patient Stated Goal: return home with family and home aides to assist PT Goal Formulation: With patient Time For Goal Achievement: 09/07/18 Potential to Achieve Goals: Good    Frequency Min 3X/week   Barriers to discharge        Co-evaluation               AM-PAC PT "6 Clicks" Mobility  Outcome Measure Help needed turning from your back to your side while in a flat bed without using bedrails?: None Help needed moving from lying on your back to sitting on the side of a flat bed without using bedrails?: None Help needed moving to and from a bed to a chair (including a wheelchair)?: A Little Help needed standing up from a chair using your arms (e.g., wheelchair or bedside chair)?: A Little Help needed to walk in hospital room?: A Little Help needed climbing 3-5 steps with a railing? : A Lot 6 Click Score: 19    End of Session Equipment Utilized During Treatment: Oxygen Activity Tolerance: Patient tolerated treatment well;Patient limited by fatigue Patient left: in chair;with call bell/phone within reach Nurse Communication: Mobility status PT Visit Diagnosis: Unsteadiness on feet (R26.81);Other abnormalities of gait and mobility (R26.89);Muscle weakness (generalized) (M62.81)    Time:  4081-4481 PT Time Calculation (min) (ACUTE ONLY): 31 min   Charges:   PT Evaluation $PT Eval Moderate Complexity: 1 Mod PT Treatments $Therapeutic Activity: 23-37 mins        10:35 AM, 09/03/18 Lonell Grandchild, MPT Physical Therapist with [facility] [phone] office [phone] mobile phone

## 2018-09-04 DIAGNOSIS — D519 Vitamin B12 deficiency anemia, unspecified: Secondary | ICD-10-CM

## 2018-09-04 LAB — CBC WITH DIFFERENTIAL/PLATELET
Abs Immature Granulocytes: 0.02 10*3/uL (ref 0.00–0.07)
Basophils Absolute: 0.1 10*3/uL (ref 0.0–0.1)
Basophils Relative: 1 %
Eosinophils Absolute: 0.6 10*3/uL — ABNORMAL HIGH (ref 0.0–0.5)
Eosinophils Relative: 8 %
HCT: 30.6 % — ABNORMAL LOW (ref 36.0–46.0)
Hemoglobin: 8.6 g/dL — ABNORMAL LOW (ref 12.0–15.0)
Immature Granulocytes: 0 %
Lymphocytes Relative: 29 %
Lymphs Abs: 2.3 10*3/uL (ref 0.7–4.0)
MCH: 24.1 pg — ABNORMAL LOW (ref 26.0–34.0)
MCHC: 28.1 g/dL — ABNORMAL LOW (ref 30.0–36.0)
MCV: 85.7 fL (ref 80.0–100.0)
Monocytes Absolute: 0.8 10*3/uL (ref 0.1–1.0)
Monocytes Relative: 10 %
Neutro Abs: 4 10*3/uL (ref 1.7–7.7)
Neutrophils Relative %: 52 %
Platelets: 347 10*3/uL (ref 150–400)
RBC: 3.57 MIL/uL — ABNORMAL LOW (ref 3.87–5.11)
RDW: 18.1 % — ABNORMAL HIGH (ref 11.5–15.5)
WBC: 7.8 10*3/uL (ref 4.0–10.5)
nRBC: 0 % (ref 0.0–0.2)

## 2018-09-04 LAB — GLUCOSE, CAPILLARY
Glucose-Capillary: 202 mg/dL — ABNORMAL HIGH (ref 70–99)
Glucose-Capillary: 156 mg/dL — ABNORMAL HIGH (ref 70–99)
Glucose-Capillary: 171 mg/dL — ABNORMAL HIGH (ref 70–99)
Glucose-Capillary: 193 mg/dL — ABNORMAL HIGH (ref 70–99)
Glucose-Capillary: 220 mg/dL — ABNORMAL HIGH (ref 70–99)

## 2018-09-04 LAB — RENAL FUNCTION PANEL
Albumin: 3.2 g/dL — ABNORMAL LOW (ref 3.5–5.0)
Anion gap: 7 (ref 5–15)
BUN: 37 mg/dL — ABNORMAL HIGH (ref 8–23)
CO2: 31 mmol/L (ref 22–32)
Calcium: 8.4 mg/dL — ABNORMAL LOW (ref 8.9–10.3)
Chloride: 98 mmol/L (ref 98–111)
Creatinine, Ser: 1.36 mg/dL — ABNORMAL HIGH (ref 0.44–1.00)
GFR calc Af Amer: 46 mL/min — ABNORMAL LOW (ref >60)
GFR calc non Af Amer: 40 mL/min — ABNORMAL LOW (ref >60)
Glucose, Bld: 165 mg/dL — ABNORMAL HIGH (ref 70–99)
Phosphorus: 4 mg/dL (ref 2.5–4.6)
Potassium: 4.2 mmol/L (ref 3.5–5.1)
Sodium: 136 mmol/L (ref 135–145)

## 2018-09-04 LAB — MAGNESIUM: Magnesium: 2.1 mg/dL (ref 1.7–2.4)

## 2018-09-04 MED ORDER — CYANOCOBALAMIN 1000 MCG/ML IJ SOLN
1000.0000 ug | Freq: Every day | INTRAMUSCULAR | Status: AC
  Administered 2018-09-04 – 2018-09-10 (×7): 1000 ug via INTRAMUSCULAR
  Filled 2018-09-04 (×7): qty 1

## 2018-09-04 MED ORDER — SODIUM CHLORIDE 0.9 % IV SOLN
510.0000 mg | Freq: Once | INTRAVENOUS | Status: AC
  Administered 2018-09-04: 510 mg via INTRAVENOUS
  Filled 2018-09-04: qty 17

## 2018-09-04 MED ORDER — SENNA 8.6 MG PO TABS
2.0000 | ORAL_TABLET | Freq: Every day | ORAL | Status: DC
  Administered 2018-09-04 – 2018-09-07 (×4): 17.2 mg via ORAL
  Filled 2018-09-04 (×4): qty 2

## 2018-09-04 NOTE — Progress Notes (Signed)
PROGRESS NOTE    Stephanie Combs  OEV:[id number]  DOB: Apr 26, 1951  DOA: 08/30/2018 PCP: Stephanie Gravel, MD   Brief Admission Hx: 68 year old female with chronic lymphedema, chronic diastolic CHF, type 2 diabetes mellitus, hypothyroidism, chronic back pain presented with progressive lower extremity edema and shortness of breath not responding to oral Lasix.  She has required IV Lasix for diuresis.  09/04/2018: Patient seen.  Patient continues to report weakness and fatigue.  Patient is iron and B12 deficient.  Will give IV ferumoxytol 510 mg x 1 dose, will start patient on IM B12.  Will repeat fecal occult blood.  According to the patient, her last colonoscopy was a year ago, but could not tell me the details.  Also reported being constipated, and will want more laxatives.  No fever or chills.  No shortness of breath.  MDM/Assessment & Plan:   Severe iron deficiency anemia/symptomatic anemia- patient is status post 2 units packed red blood cells.  Hemoglobin has improved posttransfusion.  Hemoccult stool pending, continue iron supplement. 09/04/2018: We will give patient IV ferumoxytol (IV Feraheme).  B12 deficiency: Start patient on IM vitamin B12 1000 MCG daily for 7 days, then weekly for 4 weeks, then 2 weekly for 4 weeks then every month.  Chronic lymphedema Continue IV Lasix.   Continue to monitor renal function and electrolytes.    Acute diastolic CHF exacerbation: See above. Continue other management.  CAD status post DES: Double No symptoms.  Continue cardiac medications Lipitor and aspirin.  Type 2 diabetes mellitus with vascular complications: Continue to optimize.  Stage III CKD: Stable. Serum creatinine today is 1.36.  Hypothyroidism: Continue home levothyroxine.  GERD: Protonix ordered for GI protection.  Severe peripheral diabetic polyneuropathy Continue gabapentin.    Documented COPD: Stable.    DVT prophylaxis: Lovenox Code Status: Full Family  Communication: Patient updated at bedside Disposition Plan: Continue IV diuresis, PT is recommending home health PT   Consultants:  N/A  Procedures:  N/A  Antimicrobials:  N/A  Subjective: Reports feeling weak. Reports constipation.  Objective: Vitals:   09/04/18 0600 09/04/18 0609 09/04/18 0833 09/04/18 1500  BP:  (!) 124/58  (!) 117/57  Pulse:  77  79  Resp:  18  19  Temp:  97.7 F (36.5 C)  98.4 F (36.9 C)  TempSrc:  Oral  Oral  SpO2:  96% 98%   Weight: (!) 140.3 kg     Height:        Intake/Output Summary (Last 24 hours) at 09/04/2018 1536 Last data filed at 09/04/2018 0900 Gross per 24 hour  Intake 960 ml  Output 2075 ml  Net -1115 ml   Filed Weights   09/02/18 0531 09/03/18 0500 09/04/18 0600  Weight: (!) 138.8 kg (!) 136.6 kg (!) 140.3 kg   CBG (last 3)  Recent Labs    09/04/18 0317 09/04/18 0737 09/04/18 1132  GLUCAP 202* 156* 171*     REVIEW OF SYSTEMS  As per history otherwise all reviewed and reported negative  Exam:  General exam: Awake, alert.  Morbidly obese.   Respiratory system: Clear to auscultation anteriorly. Cardiovascular system: S1 & S2 heard.  2+ to 3 edema bilateral lower extremities. Gastrointestinal system: Abdomen is morbidly obese, soft and nontender progress are difficult to assess.   Central nervous system: Alert and oriented. No focal neurological deficits. Extremities: 2+ to 3 pitting edema bilateral lower extremities/lymphedema.  Data Reviewed: Basic Metabolic Panel: Recent Labs  Lab 08/31/18 0908 09/01/18 0619 09/02/18 0500 09/03/18  0502 09/04/18 0650  NA 138 139 137 137 136  K 3.9 4.2 4.1 3.9 4.2  CL 101 98 98 100 98  CO2 28 32 30 29 31   GLUCOSE 213* 238* 102* 193* 165*  BUN 22 23 29* 34* 37*  CREATININE 1.46* 1.40* 1.18* 1.40* 1.36*  CALCIUM 8.4* 8.9 8.7* 8.4* 8.4*  MG  --   --   --   --  2.1  PHOS  --   --   --  4.6 4.0   Liver Function Tests: Recent Labs  Lab 08/30/18 2115 08/31/18 0908  09/01/18 0619 09/02/18 0500 09/03/18 0502 09/04/18 0650  AST 13* 11* 11* 12*  --   --   ALT 11 11 11 10   --   --   ALKPHOS 92 92 98 97  --   --   BILITOT 0.3 1.1 0.7 0.6  --   --   PROT 6.7 6.6 7.1 5.8*  --   --   ALBUMIN 3.4* 3.5 3.6 3.2* 2.9* 3.2*   No results for input(s): LIPASE, AMYLASE in the last 168 hours. No results for input(s): AMMONIA in the last 168 hours. CBC: Recent Labs  Lab 08/30/18 2115 08/31/18 0908 09/01/18 0619 09/03/18 0502 09/04/18 0650  WBC 7.7 7.3 8.7 7.1 7.8  NEUTROABS 4.3  --   --   --  4.0  HGB 7.0* 8.9* 9.6* 8.1* 8.6*  HCT 25.0* 30.9* 33.0* 29.0* 30.6*  MCV 83.6 85.1 84.8 86.1 85.7  PLT 377 363 407* 347 347   Cardiac Enzymes: Recent Labs  Lab 08/30/18 2115  TROPONINI <0.03   CBG (last 3)  Recent Labs    09/04/18 0317 09/04/18 0737 09/04/18 1132  GLUCAP 202* 156* 171*   Recent Results (from the past 240 hour(s))  MRSA PCR Screening     Status: None   Collection Time: 09/01/18  6:32 PM  Result Value Ref Range Status   MRSA by PCR NEGATIVE NEGATIVE Final    Comment:        The GeneXpert MRSA Assay (FDA approved for NASAL specimens only), is one component of a comprehensive MRSA colonization surveillance program. It is not intended to diagnose MRSA infection nor to guide or monitor treatment for MRSA infections. Performed at [facility], [address] [place], [place] [address]      Studies: No results found.   Scheduled Meds: . aspirin EC  81 mg Oral Daily  . atorvastatin  80 mg Oral q morning - 10a  . cholecalciferol  5,000 Units Oral Daily  . cyanocobalamin  1,000 mcg Intramuscular Daily  . ferrous sulfate  325 mg Oral Q breakfast  . furosemide  40 mg Intravenous Daily  . gabapentin  300 mg Oral QID  . Gerhardt's butt cream  1 application Topical TID  . heparin  5,000 Units Subcutaneous Q8H  . insulin aspart  0-5 Units Subcutaneous QHS  . insulin aspart  0-9 Units Subcutaneous TID WC  . insulin aspart  10  Units Subcutaneous TID WC  . insulin detemir  30 Units Subcutaneous QHS  . levothyroxine  200 mcg Oral QAC breakfast  . nystatin   Topical BID  . omega-3 acid ethyl esters  1 g Oral Daily  . pantoprazole  40 mg Oral BID  . polyethylene glycol  17 g Oral BID  . potassium chloride  20 mEq Oral BID  . sodium chloride flush  3 mL Intravenous Q12H  . topiramate  150 mg Oral QPM  .  umeclidinium-vilanterol  1 puff Inhalation Daily   Continuous Infusions: . sodium chloride    . ferumoxytol      Principal Problem:   Anemia Active Problems:   DM type 2 (diabetes mellitus, type 2) (HCC)   HTN (hypertension), benign   CKD (chronic kidney disease), stage III (HCC)   Hypothyroidism   CAD (coronary artery disease)   Symptomatic anemia   Time spent:   Bonnell Public, MD [facility] 09/04/2018, 3:36 PM    LOS: 4 days  How to contact the [facility] Attending or Consulting provider [place] or covering provider during after hours [place], for this patient?  1. Check the care team in [facility] and look for a) attending/consulting TRH provider listed and b) the [facility] team listed 2. Log into [email or web address] and use [place]'s universal password to access. If you do not have the password, please contact the hospital operator. 3. Locate the [facility] provider you are looking for under [facility] and page to a number that you can be directly reached. 4. If you still have difficulty reaching the provider, please page the [facility] (Director on Call) for the Hospitalists listed on amion for assistance.

## 2018-09-04 NOTE — Progress Notes (Signed)
PT Cancellation Note  Patient Details Name: Stephanie Combs MRN: [id number] DOB: [birth year]   Cancelled Treatment:    Reason Eval/Treat Not Completed: Pain limiting ability to participate;Patient declined, no reason specified Multiple attempts for PT session.  Pt limited by LBP 8-10/10 and declined therapy today.  Pt educated on benefits of completeing therapy, pt stated she will continue to exercise in bed and transfer to/from BSC/chair with RN assistance.  Pt left with call bell within reach.  [address] LPTA; [name] 09/04/2018, 1:45 PM

## 2018-09-05 DIAGNOSIS — L03116 Cellulitis of left lower limb: Secondary | ICD-10-CM

## 2018-09-05 DIAGNOSIS — K5900 Constipation, unspecified: Secondary | ICD-10-CM

## 2018-09-05 LAB — CBC
HCT: 28.9 % — ABNORMAL LOW (ref 36.0–46.0)
Hemoglobin: 8.2 g/dL — ABNORMAL LOW (ref 12.0–15.0)
MCH: 24.3 pg — ABNORMAL LOW (ref 26.0–34.0)
MCHC: 28.4 g/dL — ABNORMAL LOW (ref 30.0–36.0)
MCV: 85.8 fL (ref 80.0–100.0)
Platelets: 343 10*3/uL (ref 150–400)
RBC: 3.37 MIL/uL — ABNORMAL LOW (ref 3.87–5.11)
RDW: 18.6 % — ABNORMAL HIGH (ref 11.5–15.5)
WBC: 8.4 10*3/uL (ref 4.0–10.5)
nRBC: 0 % (ref 0.0–0.2)

## 2018-09-05 LAB — GLUCOSE, CAPILLARY
Glucose-Capillary: 134 mg/dL — ABNORMAL HIGH (ref 70–99)
Glucose-Capillary: 220 mg/dL — ABNORMAL HIGH (ref 70–99)
Glucose-Capillary: 223 mg/dL — ABNORMAL HIGH (ref 70–99)
Glucose-Capillary: 156 mg/dL — ABNORMAL HIGH (ref 70–99)
Glucose-Capillary: 170 mg/dL — ABNORMAL HIGH (ref 70–99)

## 2018-09-05 LAB — RENAL FUNCTION PANEL
Albumin: 3.3 g/dL — ABNORMAL LOW (ref 3.5–5.0)
Anion gap: 7 (ref 5–15)
BUN: 40 mg/dL — ABNORMAL HIGH (ref 8–23)
CO2: 28 mmol/L (ref 22–32)
Calcium: 8.7 mg/dL — ABNORMAL LOW (ref 8.9–10.3)
Chloride: 102 mmol/L (ref 98–111)
Creatinine, Ser: 1.26 mg/dL — ABNORMAL HIGH (ref 0.44–1.00)
GFR calc Af Amer: 51 mL/min — ABNORMAL LOW (ref >60)
GFR calc non Af Amer: 44 mL/min — ABNORMAL LOW (ref >60)
Glucose, Bld: 140 mg/dL — ABNORMAL HIGH (ref 70–99)
Phosphorus: 3.7 mg/dL (ref 2.5–4.6)
Potassium: 4.3 mmol/L (ref 3.5–5.1)
Sodium: 137 mmol/L (ref 135–145)

## 2018-09-05 MED ORDER — SODIUM CHLORIDE 0.9 % IV SOLN: 1.0000 g | INTRAVENOUS | Status: DC

## 2018-09-05 MED ORDER — SODIUM CHLORIDE 0.9 % IV SOLN
2.0000 g | INTRAVENOUS | Status: DC
  Administered 2018-09-05 – 2018-09-06 (×2): 2 g via INTRAVENOUS
  Filled 2018-09-05 (×2): qty 20

## 2018-09-05 NOTE — Progress Notes (Signed)
PROGRESS NOTE    Stephanie Combs  OLM:[id number]  DOB: 12/25/1950  DOA: 08/30/2018 PCP: [name], MD   Brief Admission Hx: 68 year old female with chronic lymphedema, chronic diastolic CHF, type 2 diabetes mellitus, hypothyroidism, chronic back pain presented with progressive lower extremity edema and shortness of breath not responding to oral Lasix.  She has required IV Lasix for diuresis.  09/04/2018: Patient seen.  Patient continues to report weakness and fatigue.  Patient is iron and B12 deficient.  Will give IV ferumoxytol 510 mg x 1 dose, will start patient on IM B12.  Will repeat fecal occult blood.  According to the patient, her last colonoscopy was a year ago, but could not tell me the details.  Also reported being constipated, and will want more laxatives.  No fever or chills.  No shortness of breath.  09/05/2018: Patient seen.  Patient looks a lot better today.  Patient continues to report constipation.  Patient wants an enema.  Redness of the lower legs noted, worse on the left side.  Start patient on antibiotics for possible cellulitis.  MDM/Assessment & Plan:   Severe iron deficiency anemia/symptomatic anemia- patient is status post 2 units packed red blood cells.  Hemoglobin has improved posttransfusion.  Hemoccult stool pending, continue iron supplement. 09/05/2018: IV ferumoxytol (IV Feraheme) given.  B12 deficiency: Start patient on IM vitamin B12 1000 MCG daily for 7 days, then weekly for 4 weeks, then 2 weekly for 4 weeks then every month.  Chronic lymphedema Continue IV Lasix.   Continue to monitor renal function and electrolytes.    Cellulitis/, worse left lower leg: Start IV Rocephin.  Acute diastolic CHF exacerbation: See above. Continue other management.  CAD status post DES: Double No symptoms.  Continue cardiac medications Lipitor and aspirin.  Type 2 diabetes mellitus with vascular complications: Continue to optimize.  Stage III CKD: Stable.  Serum creatinine today is 1.36.  Hypothyroidism: Continue home levothyroxine.  GERD: Protonix ordered for GI protection.  Severe peripheral diabetic polyneuropathy Continue gabapentin.    Documented COPD: Stable.    Constipation: Patient was enema. Patient has been on MiraLAX and senna.  DVT prophylaxis: Lovenox Code Status: Full Family Communication: Patient updated at bedside Disposition Plan: Continue IV diuresis, PT is recommending home health PT   Consultants:  N/A  Procedures:  N/A  Antimicrobials:  Start IV Rocephin  Subjective: Continues to report constipation. Looks a bit better today.  Objective: Vitals:   09/04/18 1500 09/04/18 2041 09/05/18 0551 09/05/18 0749  BP: (!) 117/57 (!) 123/53 (!) 134/46   Pulse: 79 83 80   Resp: 19 16 16    Temp: 98.4 F (36.9 C) 98.1 F (36.7 C) 98 F (36.7 C)   TempSrc: Oral Oral Oral   SpO2:  97% 95% 97%  Weight:   (!) 138 kg   Height:        Intake/Output Summary (Last 24 hours) at 09/05/2018 1206 Last data filed at 09/05/2018 0900 Gross per 24 hour  Intake 1203 ml  Output 1300 ml  Net -97 ml   Filed Weights   09/03/18 0500 09/04/18 0600 09/05/18 0551  Weight: (!) 136.6 kg (!) 140.3 kg (!) 138 kg   CBG (last 3)  Recent Labs    09/05/18 0319 09/05/18 0733 09/05/18 1123  GLUCAP 134* 220* 223*     REVIEW OF SYSTEMS  As per history otherwise all reviewed and reported negative  Exam:  General exam: Awake, alert.  Morbidly obese.   Respiratory system:  Clear to auscultation anteriorly. Cardiovascular system: S1 & S2 heard.  2+ to 3 edema bilateral lower extremities. Gastrointestinal system: Abdomen is morbidly obese, soft and nontender progress are difficult to assess.   Central nervous system: Alert and oriented. No focal neurological deficits. Extremities: 2+ to 3 pitting edema bilateral lower extremities/lymphedema.  Cellulitis lower legs, worse on the left side.  Data Reviewed: Basic  Metabolic Panel: Recent Labs  Lab 09/01/18 0619 09/02/18 0500 09/03/18 0502 09/04/18 0650 09/05/18 0547  NA 139 137 137 136 137  K 4.2 4.1 3.9 4.2 4.3  CL 98 98 100 98 102  CO2 32 30 29 31 28   GLUCOSE 238* 102* 193* 165* 140*  BUN 23 29* 34* 37* 40*  CREATININE 1.40* 1.18* 1.40* 1.36* 1.26*  CALCIUM 8.9 8.7* 8.4* 8.4* 8.7*  MG  --   --   --  2.1  --   PHOS  --   --  4.6 4.0 3.7   Liver Function Tests: Recent Labs  Lab 08/30/18 2115 08/31/18 0908 09/01/18 0619 09/02/18 0500 09/03/18 0502 09/04/18 0650 09/05/18 0547  AST 13* 11* 11* 12*  --   --   --   ALT 11 11 11 10   --   --   --   ALKPHOS 92 92 98 97  --   --   --   BILITOT 0.3 1.1 0.7 0.6  --   --   --   PROT 6.7 6.6 7.1 5.8*  --   --   --   ALBUMIN 3.4* 3.5 3.6 3.2* 2.9* 3.2* 3.3*   No results for input(s): LIPASE, AMYLASE in the last 168 hours. No results for input(s): AMMONIA in the last 168 hours. CBC: Recent Labs  Lab 08/30/18 2115 08/31/18 0908 09/01/18 0619 09/03/18 0502 09/04/18 0650 09/05/18 0547  WBC 7.7 7.3 8.7 7.1 7.8 8.4  NEUTROABS 4.3  --   --   --  4.0  --   HGB 7.0* 8.9* 9.6* 8.1* 8.6* 8.2*  HCT 25.0* 30.9* 33.0* 29.0* 30.6* 28.9*  MCV 83.6 85.1 84.8 86.1 85.7 85.8  PLT 377 363 407* 347 347 343   Cardiac Enzymes: Recent Labs  Lab 08/30/18 2115  TROPONINI <0.03   CBG (last 3)  Recent Labs    09/05/18 0319 09/05/18 0733 09/05/18 1123  GLUCAP 134* 220* 223*   Recent Results (from the past 240 hour(s))  MRSA PCR Screening     Status: None   Collection Time: 09/01/18  6:32 PM  Result Value Ref Range Status   MRSA by PCR NEGATIVE NEGATIVE Final    Comment:        The GeneXpert MRSA Assay (FDA approved for NASAL specimens only), is one component of a comprehensive MRSA colonization surveillance program. It is not intended to diagnose MRSA infection nor to guide or monitor treatment for MRSA infections. Performed at [facility], [address] [place], [place] [address]       Studies: No results found.   Scheduled Meds: . aspirin EC  81 mg Oral Daily  . atorvastatin  80 mg Oral q morning - 10a  . cholecalciferol  5,000 Units Oral Daily  . cyanocobalamin  1,000 mcg Intramuscular Daily  . ferrous sulfate  325 mg Oral Q breakfast  . furosemide  40 mg Intravenous Daily  . gabapentin  300 mg Oral QID  . Gerhardt's butt cream  1 application Topical TID  . heparin  5,000 Units Subcutaneous Q8H  . insulin aspart  0-5 Units Subcutaneous QHS  . insulin aspart  0-9 Units Subcutaneous TID WC  . insulin aspart  10 Units Subcutaneous TID WC  . insulin detemir  30 Units Subcutaneous QHS  . levothyroxine  200 mcg Oral QAC breakfast  . nystatin   Topical BID  . omega-3 acid ethyl esters  1 g Oral Daily  . pantoprazole  40 mg Oral BID  . polyethylene glycol  17 g Oral BID  . potassium chloride  20 mEq Oral BID  . senna  2 tablet Oral Daily  . sodium chloride flush  3 mL Intravenous Q12H  . topiramate  150 mg Oral QPM  . umeclidinium-vilanterol  1 puff Inhalation Daily   Continuous Infusions: . sodium chloride      Principal Problem:   Anemia Active Problems:   DM type 2 (diabetes mellitus, type 2) (HCC)   HTN (hypertension), benign   CKD (chronic kidney disease), stage III (HCC)   Hypothyroidism   CAD (coronary artery disease)   Symptomatic anemia   Time spent:   Bonnell Public, MD [facility] 09/05/2018, 12:06 PM    LOS: 5 days  How to contact the [facility] Attending or Consulting provider [place] or covering provider during after hours [place], for this patient?  1. Check the care team in [facility] and look for a) attending/consulting TRH provider listed and b) the [facility] team listed 2. Log into [email or web address] and use [place]'s universal password to access. If you do not have the password, please contact the hospital operator. 3. Locate the [facility] provider you are looking for under [facility] and page to a number that you can be directly  reached. 4. If you still have difficulty reaching the provider, please page the [facility] (Director on Call) for the Hospitalists listed on amion for assistance.

## 2018-09-06 DIAGNOSIS — I5033 Acute on chronic diastolic (congestive) heart failure: Secondary | ICD-10-CM | POA: Diagnosis present

## 2018-09-06 LAB — RENAL FUNCTION PANEL
Albumin: 2.8 g/dL — ABNORMAL LOW (ref 3.5–5.0)
Anion gap: 8 (ref 5–15)
BUN: 45 mg/dL — ABNORMAL HIGH (ref 8–23)
CO2: 28 mmol/L (ref 22–32)
Calcium: 8.6 mg/dL — ABNORMAL LOW (ref 8.9–10.3)
Chloride: 101 mmol/L (ref 98–111)
Creatinine, Ser: 1.4 mg/dL — ABNORMAL HIGH (ref 0.44–1.00)
GFR calc Af Amer: 45 mL/min — ABNORMAL LOW (ref >60)
GFR calc non Af Amer: 39 mL/min — ABNORMAL LOW (ref >60)
Glucose, Bld: 243 mg/dL — ABNORMAL HIGH (ref 70–99)
Phosphorus: 4.7 mg/dL — ABNORMAL HIGH (ref 2.5–4.6)
Potassium: 4.6 mmol/L (ref 3.5–5.1)
Sodium: 137 mmol/L (ref 135–145)

## 2018-09-06 LAB — GLUCOSE, CAPILLARY
Glucose-Capillary: 230 mg/dL — ABNORMAL HIGH (ref 70–99)
Glucose-Capillary: 191 mg/dL — ABNORMAL HIGH (ref 70–99)
Glucose-Capillary: 225 mg/dL — ABNORMAL HIGH (ref 70–99)
Glucose-Capillary: 168 mg/dL — ABNORMAL HIGH (ref 70–99)
Glucose-Capillary: 157 mg/dL — ABNORMAL HIGH (ref 70–99)

## 2018-09-06 LAB — INTRINSIC FACTOR ANTIBODIES: Intrinsic Factor: 15.2 AU/mL — ABNORMAL HIGH (ref 0.0–1.1)

## 2018-09-06 LAB — OCCULT BLOOD X 1 CARD TO LAB, STOOL: Fecal Occult Bld: POSITIVE — AB

## 2018-09-06 MED ORDER — INSULIN ASPART 100 UNIT/ML ~~LOC~~ SOLN
0.0000 [IU] | Freq: Every day | SUBCUTANEOUS | Status: DC
  Administered 2018-09-07 – 2018-09-09 (×2): 2 [IU] via SUBCUTANEOUS
  Administered 2018-09-10: 3 [IU] via SUBCUTANEOUS
  Administered 2018-09-13: 2 [IU] via SUBCUTANEOUS
  Administered 2018-09-14: 4 [IU] via SUBCUTANEOUS
  Administered 2018-09-15 – 2018-09-16 (×2): 2 [IU] via SUBCUTANEOUS
  Administered 2018-09-18: 4 [IU] via SUBCUTANEOUS
  Administered 2018-09-19: 5 [IU] via SUBCUTANEOUS
  Administered 2018-09-20 – 2018-09-21 (×2): 2 [IU] via SUBCUTANEOUS

## 2018-09-06 MED ORDER — INSULIN ASPART 100 UNIT/ML ~~LOC~~ SOLN
0.0000 [IU] | Freq: Three times a day (TID) | SUBCUTANEOUS | Status: DC
  Administered 2018-09-07: 3 [IU] via SUBCUTANEOUS
  Administered 2018-09-07: 5 [IU] via SUBCUTANEOUS
  Administered 2018-09-07: 3 [IU] via SUBCUTANEOUS
  Administered 2018-09-08: 5 [IU] via SUBCUTANEOUS
  Administered 2018-09-08: 3 [IU] via SUBCUTANEOUS
  Administered 2018-09-08 – 2018-09-09 (×2): 2 [IU] via SUBCUTANEOUS
  Administered 2018-09-09 – 2018-09-10 (×3): 3 [IU] via SUBCUTANEOUS
  Administered 2018-09-10: 5 [IU] via SUBCUTANEOUS
  Administered 2018-09-11: 2 [IU] via SUBCUTANEOUS
  Administered 2018-09-11 (×2): 3 [IU] via SUBCUTANEOUS
  Administered 2018-09-12: 08:00:00 2 [IU] via SUBCUTANEOUS
  Administered 2018-09-13: 17:00:00 5 [IU] via SUBCUTANEOUS
  Administered 2018-09-13: 12:00:00 2 [IU] via SUBCUTANEOUS
  Administered 2018-09-13 – 2018-09-14 (×4): 3 [IU] via SUBCUTANEOUS
  Administered 2018-09-15 – 2018-09-16 (×4): 5 [IU] via SUBCUTANEOUS
  Administered 2018-09-16 – 2018-09-18 (×6): 3 [IU] via SUBCUTANEOUS
  Administered 2018-09-18: 8 [IU] via SUBCUTANEOUS
  Administered 2018-09-18: 3 [IU] via SUBCUTANEOUS
  Administered 2018-09-19 (×3): 8 [IU] via SUBCUTANEOUS
  Administered 2018-09-20 – 2018-09-21 (×6): 5 [IU] via SUBCUTANEOUS

## 2018-09-06 MED ORDER — INSULIN DETEMIR 100 UNIT/ML ~~LOC~~ SOLN
35.0000 [IU] | Freq: Every day | SUBCUTANEOUS | Status: DC
  Administered 2018-09-06: 35 [IU] via SUBCUTANEOUS
  Filled 2018-09-06 (×2): qty 0.35

## 2018-09-06 MED ORDER — FUROSEMIDE 10 MG/ML IJ SOLN
40.0000 mg | Freq: Two times a day (BID) | INTRAMUSCULAR | Status: DC
  Administered 2018-09-06 – 2018-09-12 (×11): 40 mg via INTRAVENOUS
  Filled 2018-09-06 (×12): qty 4

## 2018-09-06 NOTE — Care Management Important Message (Signed)
Important Message  Patient Details  Name: Stephanie Combs MRN: [id number] Date of Birth: November 21, 1950   Medicare Important Message Given:  Yes    Tommy Medal 09/06/2018, 2:51 PM

## 2018-09-06 NOTE — Progress Notes (Signed)
Inpatient Diabetes Program Recommendations  AACE/ADA: New Consensus Statement on Inpatient Glycemic Control (2015)  Target Ranges:  Prepandial:   less than 140 mg/dL      Peak postprandial:   less than 180 mg/dL (1-2 hours)      Critically ill patients:  140 - 180 mg/dL   Lab Results  Component Value Date   GLUCAP 225 (H) 09/06/2018   HGBA1C 7.5 (H) 08/30/2018    Review of Glycemic Control Results for Stephanie Combs, Stephanie Combs (MRN [id number]) as of 09/06/2018 11:17  Ref. Range 09/06/2018 02:54 09/06/2018 07:25 09/06/2018 10:59  Glucose-Capillary Latest Ref Range: 70 - 99 mg/dL 230 (H) 191 (H) 225 (H)   Diabetes history: type 2 Dm Outpatient Diabetes medications: Levemir 40 units QHS, Novolog 1-20 units TID Current orders for Inpatient glycemic control: Levemir 30 units QHS, Novolog 10 units TID, Novolog 0-9 units TID, Novolog 0-5 units QHS  Inpatient Diabetes Program Recommendations:   Consider increasing Levemir to 34 units QHS.   Thanks, Bronson Curb, MSN, RNC-OB Diabetes Coordinator [phone] (8a-5p)

## 2018-09-06 NOTE — Progress Notes (Signed)
PROGRESS NOTE  Stephanie Combs WNI:[id number] DOB: [birth year] DOA: 08/30/2018 PCP: [name], MD  Brief History:  68 year old female with a history of morbid obesity, COPD with chronic respiratoryfailureon home O2, chronicback pain, anxiety, IDDM, CKD III, hypothyroidism, chronic diastolic CHF, CAD s/p DES to LAD and angioplasty to D1 in 05/2017, DDD, pedal edemapresenting with increased lower extremity edema and shortness of breath.  Patient has been taking an increased Lasix dose over the last 3 weeks.  She typically takes 40 mg daily but was increased to 40 mg in the morning and 20 mg in the evening.  She has had increased urine output but states that her lower extremity swelling seems to be progressing to her abdomen.  She is feeling more short of breath but continues her home oxygen at 2 L.   Initial chest x-ray showed left lower lobe atelectasis/infiltrate with small left effusion.  BNP was 254.0.  The patient was started on intravenous furosemide.  Assessment/Plan: Acute on chronic diastolic CHF -3/81/8299 echo EF 60-65%, impaired relaxation, RVSP 46.8, no WMA -Continue intravenous furosemide -NEG 5 pounds since admission -I's and O's incomplete  Symptomatic anemia/iron deficiency anemia -Presented with hemoglobin 7.0 -Transfused 2 unit PRBC this admission -Transfused Feraheme x1 09/05/2018 -08/30/18--iron saturation 2%, ferritin 4 -3/71/69--C78--938 -folic acid 8.0  Orthostatic hypotension -Suspect this may be in part due to her polypharmacy contributing to her orthostasis with medications including but not not limited to gabapentin, Xanax, Zanaflex -diabetic dysautonomia may also be contributing  Recurrent Syncope -likely multifactorial including orthostatic hypotension, hypoglycemia, severe hypothyroidism and polypharmacy  Hypothyroidism -Continue Synthroid -08/30/18 TSH 0.813  diabetes mellitus type 2 uncontrolled with hyperglycemia -Patient frequently self  adjust her own Levemir -increase levemir at 35 units daily -08/30/18 hemoglobin A 1C--7.5  CAD -personally reviewed EKG--no concerning ST-T changes -05/2017 she underwent R/LHC showing 75% ostial LAD, 70% prox LAD1 with 80% side branch ostial D1, 60% prox LAD2 -> felt possibly to need bypass but not a suitable candidate for CABG -Continue aspirin,  Statin--plavix stopped on 08/25/18 office visit  CKD stage III -Baseline creatinine 1.4-1.7 -A.m. BMP  Chronic respiratory failure with hypoxia -Stable on 2 L at home -Stable presently  COPD -Stable without exacerbation -continue Anoro  Morbid Obesity -BMI 47.74 -lifestyle modification        Disposition Plan:   Home in 1-2 days  Family Communication:   No Family at bedside  Consultants:  none  Code Status:  FULL  DVT Prophylaxis:  [place] Heparin   Procedures: As Listed in Progress Note Above  Antibiotics: Ceftriaxone 4/26>>>4/27       Subjective: Pt breathing better.  She denies cp, n/v/d, abd pain.  Complains of constipation.  No f/c, cough  Objective: Vitals:   09/05/18 2119 09/06/18 0556 09/06/18 0857 09/06/18 1351  BP: (!) 123/50 (!) 112/53  (!) 155/85  Pulse: 72 73  80  Resp: 17 18  18   Temp: 98.6 F (37 C) 98.7 F (37.1 C)  98.3 F (36.8 C)  TempSrc: Oral Oral  Oral  SpO2: 96% 95% 96% 99%  Weight:  (!) 138.3 kg    Height:        Intake/Output Summary (Last 24 hours) at 09/06/2018 1608 Last data filed at 09/06/2018 1233 Gross per 24 hour  Intake 1299.79 ml  Output 2101 ml  Net -801.21 ml   Weight change: 0.3 kg Exam:   General:  Pt is alert, follows commands appropriately,  not in acute distress  HEENT: No icterus, No thrush, No neck mass, [place]/AT  Cardiovascular: RRR, S1/S2, no rubs, no gallops  Respiratory: bibasilar crackles, no wheeze  Abdomen: Soft/+BS, non tender, non distended, no guarding  Extremities: 2 + LE edema, No lymphangitis, No petechiae, No rashes, no synovitis    Data Reviewed: I have personally reviewed following labs and imaging studies Basic Metabolic Panel: Recent Labs  Lab 09/02/18 0500 09/03/18 0502 09/04/18 0650 09/05/18 0547 09/06/18 0611  NA 137 137 136 137 137  K 4.1 3.9 4.2 4.3 4.6  CL 98 100 98 102 101  CO2 30 29 31 28 28   GLUCOSE 102* 193* 165* 140* 243*  BUN 29* 34* 37* 40* 45*  CREATININE 1.18* 1.40* 1.36* 1.26* 1.40*  CALCIUM 8.7* 8.4* 8.4* 8.7* 8.6*  MG  --   --  2.1  --   --   PHOS  --  4.6 4.0 3.7 4.7*   Liver Function Tests: Recent Labs  Lab 08/30/18 2115 08/31/18 0908 09/01/18 0619 09/02/18 0500 09/03/18 0502 09/04/18 0650 09/05/18 0547 09/06/18 0611  AST 13* 11* 11* 12*  --   --   --   --   ALT 11 11 11 10   --   --   --   --   ALKPHOS 92 92 98 97  --   --   --   --   BILITOT 0.3 1.1 0.7 0.6  --   --   --   --   PROT 6.7 6.6 7.1 5.8*  --   --   --   --   ALBUMIN 3.4* 3.5 3.6 3.2* 2.9* 3.2* 3.3* 2.8*   No results for input(s): LIPASE, AMYLASE in the last 168 hours. No results for input(s): AMMONIA in the last 168 hours. Coagulation Profile: No results for input(s): INR, PROTIME in the last 168 hours. CBC: Recent Labs  Lab 08/30/18 2115 08/31/18 0908 09/01/18 0619 09/03/18 0502 09/04/18 0650 09/05/18 0547  WBC 7.7 7.3 8.7 7.1 7.8 8.4  NEUTROABS 4.3  --   --   --  4.0  --   HGB 7.0* 8.9* 9.6* 8.1* 8.6* 8.2*  HCT 25.0* 30.9* 33.0* 29.0* 30.6* 28.9*  MCV 83.6 85.1 84.8 86.1 85.7 85.8  PLT 377 363 407* 347 347 343   Cardiac Enzymes: Recent Labs  Lab 08/30/18 2115  TROPONINI <0.03   BNP: Invalid input(s): POCBNP CBG: Recent Labs  Lab 09/05/18 1626 09/05/18 2117 09/06/18 0254 09/06/18 0725 09/06/18 1059  GLUCAP 156* 170* 230* 191* 225*   HbA1C: No results for input(s): HGBA1C in the last 72 hours. Urine analysis:    Component Value Date/Time   COLORURINE YELLOW 04/05/2018 1559   APPEARANCEUR HAZY (A) 04/05/2018 1559   LABSPEC >1.030 (H) 04/05/2018 1559   PHURINE 5.5 04/05/2018  1559   GLUCOSEU NEGATIVE 04/05/2018 1559   HGBUR SMALL (A) 04/05/2018 1559   BILIRUBINUR SMALL (A) 04/05/2018 1559   KETONESUR 15 (A) 04/05/2018 1559   PROTEINUR 100 (A) 04/05/2018 1559   UROBILINOGEN 0.2 12/04/2014 0650   NITRITE NEGATIVE 04/05/2018 1559   LEUKOCYTESUR SMALL (A) 04/05/2018 1559   Sepsis Labs: @LABRCNTIP (procalcitonin:4,lacticidven:4) ) Recent Results (from the past 240 hour(s))  MRSA PCR Screening     Status: None   Collection Time: 09/01/18  6:32 PM  Result Value Ref Range Status   MRSA by PCR NEGATIVE NEGATIVE Final    Comment:        The GeneXpert MRSA Assay (FDA approved  for NASAL specimens only), is one component of a comprehensive MRSA colonization surveillance program. It is not intended to diagnose MRSA infection nor to guide or monitor treatment for MRSA infections. Performed at [facility], [address], [place], [place] [address]      Scheduled Meds: . aspirin EC  81 mg Oral Daily  . atorvastatin  80 mg Oral q morning - 10a  . cholecalciferol  5,000 Units Oral Daily  . cyanocobalamin  1,000 mcg Intramuscular Daily  . ferrous sulfate  325 mg Oral Q breakfast  . furosemide  40 mg Intravenous Daily  . gabapentin  300 mg Oral QID  . Gerhardt's butt cream  1 application Topical TID  . heparin  5,000 Units Subcutaneous Q8H  . insulin aspart  0-5 Units Subcutaneous QHS  . insulin aspart  0-9 Units Subcutaneous TID WC  . insulin aspart  10 Units Subcutaneous TID WC  . insulin detemir  30 Units Subcutaneous QHS  . levothyroxine  200 mcg Oral QAC breakfast  . nystatin   Topical BID  . omega-3 acid ethyl esters  1 g Oral Daily  . pantoprazole  40 mg Oral BID  . polyethylene glycol  17 g Oral BID  . potassium chloride  20 mEq Oral BID  . senna  2 tablet Oral Daily  . sodium chloride flush  3 mL Intravenous Q12H  . topiramate  150 mg Oral QPM  . umeclidinium-vilanterol  1 puff Inhalation Daily   Continuous Infusions: . sodium chloride    .  cefTRIAXone (ROCEPHIN)  IV 2 g (09/05/18 1806)    Procedures/Studies: Dg Chest Portable 1 View  Result Date: 08/30/2018 CLINICAL DATA:  Short of breath EXAM: PORTABLE CHEST 1 VIEW COMPARISON:  07/05/2018 FINDINGS: Mild cardiac enlargement. Negative for heart failure or edema. Mild left lower lobe airspace disease and small left effusion. Right lung base clear. IMPRESSION: Mild left lower lobe atelectasis/infiltrate and small left effusion. Mild progression since the prior study. Electronically Signed   By: Franchot Gallo M.D.   On: 08/30/2018 21:33    Orson Eva, DO  [facility] Pager [phone]  If 7PM-7AM, please contact night-coverage [email or web address] Password [email or web address] 09/06/2018, 4:08 PM   LOS: 6 days

## 2018-09-06 NOTE — Progress Notes (Signed)
Physical Therapy Treatment Patient Details Name: SHAUNE MALACARA MRN: [id number] DOB: 01-Dec-1950 Today's Date: 09/06/2018    History of Present Illness Denielle Bayard  is a 68 y.o. female, w CAD s/p DES prox LAD, CHF (EF 60-65%), Dm2, CKD stage3, Hypertension, Hypothyroidism, Chronic lymphedema, apparently presents with c/o dyspnea for the past 5 days, along with increase in lower ext edema, refractory to increase in oral lasix. Slight mostly dry cough.  Pt denies fever, chills, cp, palp, abd pain, n/v, diarrhea,  Brbpr.  Pt is taking iron for her chronic anemia. Pt notes having had prior colonoscopy, normal.  Hx of diverticulosis.    PT Comments    Pt limited by LBP, RN informed and pre-medication for pain control prior session.  Pt with supervision or min guard for majority of assistance, requires increased time and some cueing for assistance with hand placement with bed mobility and transfer training.  Pt able to make small short distance steps with use of RW from bed to [facility] and later used RW to return to chair.  Pt limited by LE weakness and fatigue.  Pt did reports some lightness and some dizziness initially following supine to sit, did take orthostatic hypertension measurements taken.     09/06/18 1221  Therapy Vitals  Patient Position (if appropriate) Orthostatic Vitals  Orthostatic Lying   BP- Lying 117/50  Pulse- Lying 60  Orthostatic Sitting  BP- Sitting 130/56  Pulse- Sitting 69  Orthostatic Standing at 0 minutes  BP- Standing at 0 minutes (!) 119/97  Pulse- Standing at 0 minutes 101  Oxygen Therapy  O2 Device Nasal Cannula  O2 Flow Rate (L/min) 2 L/min      Follow Up Recommendations  Home health PT;Supervision for mobility/OOB;Supervision - Intermittent     Equipment Recommendations  None recommended by PT    Recommendations for Other Services       Precautions / Restrictions Precautions Precautions: Fall Restrictions Weight Bearing Restrictions: No     Mobility  Bed Mobility               General bed mobility comments: increased time, use of bedrail  Transfers Overall transfer level: Needs assistance Equipment used: Rolling walker (2 wheeled) Transfers: Sit to/from Stand Sit to Stand: Supervision         General transfer comment: slow labored movement using armrest of chair  Ambulation/Gait Ambulation/Gait assistance: Min guard Gait Distance (Feet): 4 Feet Assistive device: Rolling walker (2 wheeled) Gait Pattern/deviations: Decreased step length - right;Decreased step length - left;Decreased stride length Gait velocity: slow   General Gait Details: limited to 3-4 slow labored steps at bedside due to c/o fatigue   Stairs             Wheelchair Mobility    Modified Rankin (Stroke Patients Only)       Balance                                            Cognition Arousal/Alertness: Awake/alert Behavior During Therapy: WFL for tasks assessed/performed Overall Cognitive Status: Within Functional Limits for tasks assessed                                        Exercises      General Comments  Pertinent Vitals/Pain Pain Assessment: No/denies pain Pain Score: 8  Pain Location: LBP Pain Descriptors / Indicators: Discomfort;Pressure Pain Intervention(s): Monitored during session;Limited activity within patient's tolerance;Premedicated before session    Home Living                      Prior Function            PT Goals (current goals can now be found in the care plan section)      Frequency    Min 3X/week      PT Plan Current plan remains appropriate    Co-evaluation              AM-PAC PT "6 Clicks" Mobility   Outcome Measure  Help needed turning from your back to your side while in a flat bed without using bedrails?: None Help needed moving from lying on your back to sitting on the side of a flat bed without using  bedrails?: None Help needed moving to and from a bed to a chair (including a wheelchair)?: A Little Help needed standing up from a chair using your arms (e.g., wheelchair or bedside chair)?: A Little Help needed to walk in hospital room?: A Little Help needed climbing 3-5 steps with a railing? : A Lot 6 Click Score: 19    End of Session Equipment Utilized During Treatment: Oxygen Activity Tolerance: Patient tolerated treatment well;Patient limited by fatigue Patient left: in chair;with call bell/phone within reach Nurse Communication: Mobility status PT Visit Diagnosis: Unsteadiness on feet (R26.81);Other abnormalities of gait and mobility (R26.89);Muscle weakness (generalized) (M62.81)     Time: 0950(session from [phone](following restroom break from 1030-1040 ambulate from [facility] to chair) PT Time Calculation (min) (ACUTE ONLY): 50 min  Charges:  $Therapeutic Activity: 23-37 mins                     [address] LPTA; [place]   Aldona Lento 09/06/2018, 12:25 PM

## 2018-09-07 ENCOUNTER — Encounter (HOSPITAL_COMMUNITY): Payer: Self-pay | Admitting: Gastroenterology

## 2018-09-07 ENCOUNTER — Inpatient Hospital Stay (HOSPITAL_COMMUNITY): Payer: Medicare Other

## 2018-09-07 DIAGNOSIS — D508 Other iron deficiency anemias: Secondary | ICD-10-CM

## 2018-09-07 LAB — GLUCOSE, CAPILLARY
Glucose-Capillary: 176 mg/dL — ABNORMAL HIGH (ref 70–99)
Glucose-Capillary: 152 mg/dL — ABNORMAL HIGH (ref 70–99)
Glucose-Capillary: 171 mg/dL — ABNORMAL HIGH (ref 70–99)
Glucose-Capillary: 224 mg/dL — ABNORMAL HIGH (ref 70–99)
Glucose-Capillary: 210 mg/dL — ABNORMAL HIGH (ref 70–99)

## 2018-09-07 LAB — CBC
HCT: 27.1 % — ABNORMAL LOW (ref 36.0–46.0)
Hemoglobin: 7.8 g/dL — ABNORMAL LOW (ref 12.0–15.0)
MCH: 24.8 pg — ABNORMAL LOW (ref 26.0–34.0)
MCHC: 28.8 g/dL — ABNORMAL LOW (ref 30.0–36.0)
MCV: 86.3 fL (ref 80.0–100.0)
Platelets: 284 10*3/uL (ref 150–400)
RBC: 3.14 MIL/uL — ABNORMAL LOW (ref 3.87–5.11)
RDW: 19.8 % — ABNORMAL HIGH (ref 11.5–15.5)
WBC: 7.1 10*3/uL (ref 4.0–10.5)
nRBC: 0 % (ref 0.0–0.2)

## 2018-09-07 LAB — BASIC METABOLIC PANEL
Anion gap: 7 (ref 5–15)
BUN: 50 mg/dL — ABNORMAL HIGH (ref 8–23)
CO2: 28 mmol/L (ref 22–32)
Calcium: 8.4 mg/dL — ABNORMAL LOW (ref 8.9–10.3)
Chloride: 103 mmol/L (ref 98–111)
Creatinine, Ser: 1.29 mg/dL — ABNORMAL HIGH (ref 0.44–1.00)
GFR calc Af Amer: 49 mL/min — ABNORMAL LOW (ref >60)
GFR calc non Af Amer: 43 mL/min — ABNORMAL LOW (ref >60)
Glucose, Bld: 174 mg/dL — ABNORMAL HIGH (ref 70–99)
Potassium: 4.5 mmol/L (ref 3.5–5.1)
Sodium: 138 mmol/L (ref 135–145)

## 2018-09-07 MED ORDER — INSULIN ASPART 100 UNIT/ML ~~LOC~~ SOLN
5.0000 [IU] | Freq: Three times a day (TID) | SUBCUTANEOUS | Status: DC
  Administered 2018-09-07 – 2018-09-20 (×34): 5 [IU] via SUBCUTANEOUS

## 2018-09-07 MED ORDER — INSULIN DETEMIR 100 UNIT/ML ~~LOC~~ SOLN
20.0000 [IU] | Freq: Every day | SUBCUTANEOUS | Status: DC
  Administered 2018-09-07 – 2018-09-14 (×8): 20 [IU] via SUBCUTANEOUS
  Filled 2018-09-07 (×12): qty 0.2

## 2018-09-07 MED ORDER — SENNA 8.6 MG PO TABS
2.0000 | ORAL_TABLET | Freq: Two times a day (BID) | ORAL | Status: DC
  Administered 2018-09-07 – 2018-09-22 (×29): 17.2 mg via ORAL
  Filled 2018-09-07 (×30): qty 2

## 2018-09-07 MED ORDER — CEFAZOLIN SODIUM-DEXTROSE 1-4 GM/50ML-% IV SOLN
1.0000 g | Freq: Three times a day (TID) | INTRAVENOUS | Status: DC
  Administered 2018-09-07 – 2018-09-11 (×11): 1 g via INTRAVENOUS
  Filled 2018-09-07 (×23): qty 50

## 2018-09-07 NOTE — Progress Notes (Addendum)
PROGRESS NOTE  Stephanie Combs OZD:[id number] DOB: 12-11-1950 DOA: 08/30/2018 PCP: [name], MD  Brief History:  68 year old female with a history of morbid obesity, COPD with chronic respiratoryfailureon home O2, chronicback pain, anxiety, IDDM, CKD III, hypothyroidism, chronic diastolic CHF, CAD s/p DES to LAD and angioplasty to D1 in 05/2017, DDD, pedal edemapresenting with increased lower extremity edema and shortness of breath.  Patient has been taking an increased Lasix dose over the last 3 weeks.She typically takes 40 mg daily but was increased to 40 mg in the morning and 20 mg in the evening.She has had increased urine output but states that her lower extremity swelling seems to be progressing to her abdomen.She is feeling more short of breath but continues her home oxygen at 2 L.  Initial chest x-ray showed left lower lobe atelectasis/infiltrate with small left effusion.  BNP was 254.0.  The patient was started on intravenous furosemide.  Assessment/Plan: Acute on chronic diastolic CHF -2/59/5638 echo EF 60-65%, impaired relaxation, RVSP 46.8, no WMA -Continue intravenous furosemide bid -NEG 8 pounds since admission -I's and O's incomplete  Symptomatic anemia/iron deficiency anemia -Presented with hemoglobin 7.0 -Transfused 2 unit PRBC this admission -Transfused Feraheme x1 09/05/2018 -08/30/18--iron saturation 2%, ferritin 4 -7/56/43--P29--518 -folic acid 8.0 -FOBT+ -consulted GI-->planned possible endoscopy 4/29  Cellulitis left leg -discontinue ceftriaxone, start cefazolin -there is a component of venous stasis dermatitis -venous duplex r/o DVT  Orthostatic hypotension -Suspect this may be in part due to her polypharmacy contributing to her orthostasis with medications including but not limited to gabapentin, Xanax, Zanaflex -diabetic dysautonomia may also be contributing  Recurrent Syncope -likely multifactorial including orthostatic  hypotension, hypoglycemia, severe hypothyroidismand polypharmacy  Hypothyroidism -Continue Synthroid -08/30/18 TSH 0.813  diabetes mellitus type 2 uncontrolled with hyperglycemia -Patient frequently self adjust her own Levemir -increase levemir at 35 units daily -novolog 10 units with meals -levemir and premeal insulin decreased in preparation for colonoscopy prep -08/30/18 hemoglobin A 1C--7.5  CAD -personally reviewed EKG--no concerning ST-T changes -05/2017 she underwent R/LHC showing 75% ostial LAD, 70% prox LAD1 with 80% side branch ostial D1, 60% prox LAD2 -> felt possibly to need bypass but not a suitable candidate for CABG -Continue aspirin,  Statin--plavix stopped on 08/25/18 office visit  CKD stage III -Baseline creatinine 1.3-1.7 -A.m. BMP  Chronic respiratory failure with hypoxia -Stable on 2 L at home -Stable presently  COPD -Stable without exacerbation -continue Anoro  Morbid Obesity -BMI 47.74 -lifestyle modification        Disposition Plan:   Home in 2-3  days  Family Communication:   No Family at bedside  Consultants:  none  Code Status:  FULL  DVT Prophylaxis:  [place] Heparin   Procedures: As Listed in Progress Note Above  Antibiotics: Ceftriaxone 4/26>>>4/27       Subjective: Pt complains of bilateral leg pain.  Feel sob is improving.  No nvd, hematochezia, melena, abd pain.  No f/c  Objective: Vitals:   09/06/18 2111 09/07/18 0646 09/07/18 0733 09/07/18 1523  BP: (!) 129/98 (!) 127/42  (!) 121/57  Pulse: 75 (!) 58  61  Resp: 16 16  18   Temp: 97.8 F (36.6 C) 97.9 F (36.6 C)  98 F (36.7 C)  TempSrc: Oral Oral  Oral  SpO2: 97% 99% 95% 100%  Weight:  (!) 137 kg    Height:        Intake/Output Summary (Last 24 hours) at 09/07/2018 1557 Last  data filed at 09/07/2018 1010 Gross per 24 hour  Intake 960 ml  Output 2550 ml  Net -1590 ml   Weight change: -1.3 kg Exam:   General:  Pt is alert, follows  commands appropriately, not in acute distress  HEENT: No icterus, No thrush, No neck mass, [place]/AT  Cardiovascular: RRR, S1/S2, no rubs, no gallops  Respiratory: CTA bilaterally, no wheezing, no crackles, no rhonchi  Abdomen: Soft/+BS, non tender, non distended, no guarding  Extremities: No edema, No lymphangitis, No petechiae, No rashes, no synovitis   Data Reviewed: I have personally reviewed following labs and imaging studies Basic Metabolic Panel: Recent Labs  Lab 09/03/18 0502 09/04/18 0650 09/05/18 0547 09/06/18 0611 09/07/18 0557  NA 137 136 137 137 138  K 3.9 4.2 4.3 4.6 4.5  CL 100 98 102 101 103  CO2 29 31 28 28 28   GLUCOSE 193* 165* 140* 243* 174*  BUN 34* 37* 40* 45* 50*  CREATININE 1.40* 1.36* 1.26* 1.40* 1.29*  CALCIUM 8.4* 8.4* 8.7* 8.6* 8.4*  MG  --  2.1  --   --   --   PHOS 4.6 4.0 3.7 4.7*  --    Liver Function Tests: Recent Labs  Lab 09/01/18 0619 09/02/18 0500 09/03/18 0502 09/04/18 0650 09/05/18 0547 09/06/18 0611  AST 11* 12*  --   --   --   --   ALT 11 10  --   --   --   --   ALKPHOS 98 97  --   --   --   --   BILITOT 0.7 0.6  --   --   --   --   PROT 7.1 5.8*  --   --   --   --   ALBUMIN 3.6 3.2* 2.9* 3.2* 3.3* 2.8*   No results for input(s): LIPASE, AMYLASE in the last 168 hours. No results for input(s): AMMONIA in the last 168 hours. Coagulation Profile: No results for input(s): INR, PROTIME in the last 168 hours. CBC: Recent Labs  Lab 09/01/18 0619 09/03/18 0502 09/04/18 0650 09/05/18 0547 09/07/18 0557  WBC 8.7 7.1 7.8 8.4 7.1  NEUTROABS  --   --  4.0  --   --   HGB 9.6* 8.1* 8.6* 8.2* 7.8*  HCT 33.0* 29.0* 30.6* 28.9* 27.1*  MCV 84.8 86.1 85.7 85.8 86.3  PLT 407* 347 347 343 284   Cardiac Enzymes: No results for input(s): CKTOTAL, CKMB, CKMBINDEX, TROPONINI in the last 168 hours. BNP: Invalid input(s): POCBNP CBG: Recent Labs  Lab 09/06/18 1657 09/06/18 2129 09/07/18 0300 09/07/18 0719 09/07/18 1224  GLUCAP  168* 157* 176* 152* 171*   HbA1C: No results for input(s): HGBA1C in the last 72 hours. Urine analysis:    Component Value Date/Time   COLORURINE YELLOW 04/05/2018 1559   APPEARANCEUR HAZY (A) 04/05/2018 1559   LABSPEC >1.030 (H) 04/05/2018 1559   PHURINE 5.5 04/05/2018 1559   GLUCOSEU NEGATIVE 04/05/2018 1559   HGBUR SMALL (A) 04/05/2018 1559   BILIRUBINUR SMALL (A) 04/05/2018 1559   KETONESUR 15 (A) 04/05/2018 1559   PROTEINUR 100 (A) 04/05/2018 1559   UROBILINOGEN 0.2 12/04/2014 0650   NITRITE NEGATIVE 04/05/2018 1559   LEUKOCYTESUR SMALL (A) 04/05/2018 1559   Sepsis Labs: @LABRCNTIP (procalcitonin:4,lacticidven:4) ) Recent Results (from the past 240 hour(s))  MRSA PCR Screening     Status: None   Collection Time: 09/01/18  6:32 PM  Result Value Ref Range Status   MRSA by PCR NEGATIVE NEGATIVE  Final    Comment:        The GeneXpert MRSA Assay (FDA approved for NASAL specimens only), is one component of a comprehensive MRSA colonization surveillance program. It is not intended to diagnose MRSA infection nor to guide or monitor treatment for MRSA infections. Performed at [facility], [address], [place], [place] [address]      Scheduled Meds: . aspirin EC  81 mg Oral Daily  . atorvastatin  80 mg Oral q morning - 10a  . cholecalciferol  5,000 Units Oral Daily  . cyanocobalamin  1,000 mcg Intramuscular Daily  . furosemide  40 mg Intravenous BID  . gabapentin  300 mg Oral QID  . Gerhardt's butt cream  1 application Topical TID  . heparin  5,000 Units Subcutaneous Q8H  . insulin aspart  0-15 Units Subcutaneous TID WC  . insulin aspart  0-5 Units Subcutaneous QHS  . insulin aspart  5 Units Subcutaneous TID WC  . insulin detemir  20 Units Subcutaneous QHS  . levothyroxine  200 mcg Oral QAC breakfast  . nystatin   Topical BID  . omega-3 acid ethyl esters  1 g Oral Daily  . pantoprazole  40 mg Oral BID  . polyethylene glycol  17 g Oral BID  . potassium chloride   20 mEq Oral BID  . senna  2 tablet Oral BID WC  . sodium chloride flush  3 mL Intravenous Q12H  . topiramate  150 mg Oral QPM  . umeclidinium-vilanterol  1 puff Inhalation Daily   Continuous Infusions: . sodium chloride      Procedures/Studies: Dg Chest Portable 1 View  Result Date: 08/30/2018 CLINICAL DATA:  Short of breath EXAM: PORTABLE CHEST 1 VIEW COMPARISON:  07/05/2018 FINDINGS: Mild cardiac enlargement. Negative for heart failure or edema. Mild left lower lobe airspace disease and small left effusion. Right lung base clear. IMPRESSION: Mild left lower lobe atelectasis/infiltrate and small left effusion. Mild progression since the prior study. Electronically Signed   By: Franchot Gallo M.D.   On: 08/30/2018 21:33    Orson Eva, DO  [facility] Pager [phone]  If 7PM-7AM, please contact night-coverage [email or web address] Password [facility] 09/07/2018, 3:57 PM   LOS: 7 days

## 2018-09-07 NOTE — Progress Notes (Signed)
Physical Therapy Treatment Patient Details Name: Stephanie Combs MRN: [id number] DOB: 02-25-1951 Today's Date: 09/07/2018    History of Present Illness Stephanie Combs  is a 68 y.o. female, w CAD s/p DES prox LAD, CHF (EF 60-65%), Dm2, CKD stage3, Hypertension, Hypothyroidism, Chronic lymphedema, apparently presents with c/o dyspnea for the past 5 days, along with increase in lower ext edema, refractory to increase in oral lasix. Slight mostly dry cough.  Pt denies fever, chills, cp, palp, abd pain, n/v, diarrhea,  Brbpr.  Pt is taking iron for her chronic anemia. Pt notes having had prior colonoscopy, normal.  Hx of diverticulosis.    PT Comments    Min guard with SPT and short distance gait with supervision to [facility] and then return to bed.  Pt able to complete large bowel movement.  Pt continues to demonstrate weakness and fatigue with activity requiring supervision for safety.  NT in room to clean up at [place].     Follow Up Recommendations  Home health PT;Supervision for mobility/OOB;Supervision - Intermittent     Equipment Recommendations  None recommended by PT    Recommendations for Other Services       Precautions / Restrictions Precautions Precautions: Fall    Mobility  Bed Mobility               General bed mobility comments: increased time, use of bedrail  Transfers Overall transfer level: Needs assistance Equipment used: Rolling walker (2 wheeled) Transfers: Sit to/from Stand Sit to Stand: Supervision         General transfer comment: slow labored movement using armrest of chair  Ambulation/Gait Ambulation/Gait assistance: Min guard Gait Distance (Feet): 18 Feet Assistive device: Rolling walker (2 wheeled) Gait Pattern/deviations: Decreased step length - right;Decreased step length - left;Decreased stride length     General Gait Details: slow labored cadence, required seated rest break on BSC following washing hands at sink, limited by fatigue and c/o SOB.   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[place] (Stroke Patients Only)       Balance                                            Cognition Arousal/Alertness: Awake/alert Behavior During Therapy: WFL for tasks assessed/performed Overall Cognitive Status: Within Functional Limits for tasks assessed                                        Exercises      General Comments        Pertinent Vitals/Pain Pain Score: 7  Pain Location: Rt shoulder and BLE Pain Descriptors / Indicators: Discomfort;Pressure Pain Intervention(s): Monitored during session;Limited activity within patient's tolerance    Home Living                      Prior Function            PT Goals (current goals can now be found in the care plan section)      Frequency    Min 3X/week      PT Plan Current plan remains appropriate    Co-evaluation  AM-PAC PT "6 Clicks" Mobility   Outcome Measure  Help needed turning from your back to your side while in a flat bed without using bedrails?: None Help needed moving from lying on your back to sitting on the side of a flat bed without using bedrails?: None Help needed moving to and from a bed to a chair (including a wheelchair)?: A Little Help needed standing up from a chair using your arms (e.g., wheelchair or bedside chair)?: A Little Help needed to walk in hospital room?: A Little Help needed climbing 3-5 steps with a railing? : A Lot 6 Click Score: 19    End of Session Equipment Utilized During Treatment: Oxygen Activity Tolerance: Patient tolerated treatment well;Patient limited by fatigue Patient left: in bed;with call bell/phone within reach Nurse Communication: Mobility status PT Visit Diagnosis: Unsteadiness on feet (R26.81);Other abnormalities of gait and mobility (R26.89);Muscle weakness  (generalized) (M62.81)     Time: 6979-4801 PT Time Calculation (min) (ACUTE ONLY): 10 min  Charges:  $Therapeutic Activity: 8-22 mins                     [address] LPTA; CBIS [phone]   Aldona Lento 09/07/2018, 4:00 PM

## 2018-09-07 NOTE — Consult Note (Signed)
Referring Provider: Dr. Carles Collet  Primary Care Physician:  [name], MD Primary Gastroenterologist:  Dr. Oneida Alar (previously seen by [facility] in remote past, records not in epic).   Date of Admission: 08/30/2018 Date of Consultation: 09/07/2018  Reason for Consultation: Heme positive stool, Hgb trending down  HPI:  Stephanie Combs is a 68 y.o. year old female who presented to the ED on 08/30/2018 with over a week long history of fatigue, shortness of breath, peripheral edema. Admitting Hgb 7.0. Admitted with symptomatic anemia, worsening progressive edema multifactorial in setting of acute on chronic heart failure and chronic lymphedema. Transfused 2 units PRBCs at admission with improvement in Hgb to mid 9 range. Feraheme IV x 1. Ferritin 4 on admission and low a year ago as well. Appears baseline Hgb anywhere from 8 to 11 range over past 2 years. Hgb 7.8 today. She was heme negative on admission and heme positive yesterday, reporting significant straining in setting of constipation.    Still fatigued. SOB improved at rest but still with dyspnea and significant fatigue on exertion. Doesn't feel back to baseline. She notes a history of lifelong anemia and has been taking oral iron chronically. No prior blood transfusion. Colonoscopy/?EGD in remote past at [facility]. Believes she had polyps in the past. Questionable history of ulcers, but she is not sure. No records in epic. No hematochezia, no black/tarry stool. Chronically dark stool on iron. Protonix BID chronically. Controls GERD symptoms unless eats the wrong things. No dysphagia. Decreased appetite but contributes to medications, chronic disease. Was on colace, dulcolax BID as outpatient 4-5 years with good results. Now with constipation while inpatient but improved after resuming stool softeners. Weight loss with grief after losing husband, brother, sister within past 5 years but stable for some time.   Stopped Plavix 2 weeks ago. 81 mg aspirin daily.  Denies any other NSAIDs, only taking Tylenol. If has cancer doesn't want to know it. Every now and then severe nausea with vomiting, wonders if it is her gallbladder. States she was told she needed her gallbladder out in past but needed to lose weight. Notes she had abdominal pain with IV Feraheme.   Past Medical History:  Diagnosis Date  . Anxiety   . CAD (coronary artery disease)    a. s/p DES to LAD and angioplasty to D1 in 05/2017  . CHF (congestive heart failure) (HCC)    Diastolic  . Chronic back pain   . Chronic pain   . COPD (chronic obstructive pulmonary disease) ([place])   . Degenerative disk disease   . Diabetes mellitus without complication ([place])   . History of medication noncompliance 11/2017   "the patient frequently self-adjusts medications without notifying her physicians"  . Hypertension   . Hypothyroidism   . On home O2    2.5 L N/C prn  . Pedal edema     Past Surgical History:  Procedure Laterality Date  . CORONARY STENT INTERVENTION N/A 05/22/2017   Procedure: CORONARY STENT INTERVENTION;  Surgeon: Martinique, Peter M, MD;  Location: [place] [facility];  Service: Cardiovascular;  Laterality: N/A;  . HERNIA REPAIR    . RIGHT/LEFT HEART CATH AND CORONARY ANGIOGRAPHY N/A 05/19/2017   Procedure: RIGHT/LEFT HEART CATH AND CORONARY ANGIOGRAPHY;  Surgeon: Leonie Man, MD;  Location: [place] [facility];  Service: Cardiovascular;  Laterality: N/A;  . TUBAL LIGATION      Prior to Admission medications   Medication Sig Start Date End Date Taking? Authorizing Provider  albuterol (  PROAIR HFA) 108 (90 Base) MCG/ACT inhaler Inhale 2 puffs into the lungs every 4 (four) hours as needed. For shortness of breath 04/09/18  Yes Johnson, Clanford L, MD  albuterol (PROVENTIL) (2.5 MG/3ML) 0.083% nebulizer solution Take 2.5 mg by nebulization 2 (two) times daily as needed for wheezing or shortness of breath.    Yes [provider]  ALPRAZolam Duanne Moron) 1 MG tablet Take 1 mg by  mouth 3 (three) times daily as needed for anxiety or sleep.  01/05/17  Yes [provider]  aspirin EC 81 MG tablet Take 81 mg by mouth daily.   Yes [provider]  atorvastatin (LIPITOR) 80 MG tablet Take 1 tablet (80 mg total) by mouth daily at 6 PM. Patient taking differently: Take 80 mg by mouth every morning.  05/26/17  Yes [name], MD  Cholecalciferol (VITAMIN D3) 5000 units CAPS Take 1 capsule (5,000 Units total) by mouth daily. 08/19/16  Yes Nida, Marella Chimes, MD  docusate sodium (COLACE) 100 MG capsule Take 300 mg by mouth daily.   Yes [provider]  ferrous sulfate 325 (65 FE) MG tablet Take 1 tablet (325 mg total) by mouth daily with breakfast. 02/05/17  Yes [name], MD  furosemide (LASIX) 20 MG tablet Take 40mg  po in the morning and 20mg  in the evening Patient taking differently: Take 20-40 mg by mouth See admin instructions. Take 40mg  po in the morning and 20mg  in the evening 08/25/18  Yes Strader, [name] M, PA-C  gabapentin (NEURONTIN) 600 MG tablet Take 600 mg by mouth 4 (four) times daily.   Yes [provider]  Hydrocortisone (GERHARDT'S BUTT CREAM) CREA Apply 1 application topically 3 (three) times daily. 05/26/17  Yes [name], MD  insulin aspart (NOVOLOG) 100 UNIT/ML injection Inject 1-18 Units into the skin 3 (three) times daily with meals. Patient taking differently: Inject 1-20 Units into the skin 3 (three) times daily with meals.  04/09/18  Yes Johnson, Clanford L, MD  insulin detemir (LEVEMIR) 100 UNIT/ML injection Inject 0.4 mLs (40 Units total) into the skin at bedtime. 11/26/17  Yes Tat, Shanon Brow, MD  levothyroxine (SYNTHROID, LEVOTHROID) 200 MCG tablet Take 1 tablet by mouth daily. 06/24/18  Yes [provider]  nitroGLYCERIN (NITROSTAT) 0.4 MG SL tablet Place 0.4 mg under the tongue every 5 (five) minutes as needed for chest pain.  03/26/18  Yes [provider]  nystatin (MYCOSTATIN/NYSTOP) powder Apply  topically 2 (two) times daily. 10/10/16  Yes Eber Jones, MD  Omega-3 Fatty Acids (FISH OIL PO) Take 1 capsule by mouth daily.    Yes [provider]  ondansetron (ZOFRAN) 8 MG tablet Take 8 mg by mouth every 8 (eight) hours as needed for nausea or vomiting.  08/11/18  Yes [provider]  oxyCODONE-acetaminophen (PERCOCET) 10-325 MG tablet Take 1 tablet by mouth every 6 (six) hours as needed for pain.   Yes [provider]  OXYGEN Inhale 2 L into the lungs continuous.   Yes [provider]  pantoprazole (PROTONIX) 40 MG tablet Take 1 tablet (40 mg total) by mouth 2 (two) times daily. 08/25/18  Yes Strader, [name] M, PA-C  potassium chloride (MICRO-K) 10 MEQ CR capsule Take 2 capsules (20 mEq total) by mouth 2 (two) times daily. *May take one additional tablet as needed for cramping 04/23/18  Yes Herminio Commons, MD  tiZANidine (ZANAFLEX) 4 MG tablet Take 1 tablet (4 mg total) by mouth every 8 (eight) hours as needed for  muscle spasms. 07/08/18  Yes Kathie Dike, MD  topiramate (TOPAMAX) 50 MG tablet Take 150 mg by mouth every evening.  03/18/18  Yes [provider]  umeclidinium-vilanterol (ANORO ELLIPTA) 62.5-25 MCG/INH AEPB Inhale 1 puff into the lungs daily. Patient taking differently: Inhale 1 puff into the lungs daily as needed (for shortness of breath).  05/26/17  Yes [name], MD  SURE COMFORT INS SYR 1CC/28G 28G X 1/2" 1 ML MISC USE TO INJECT INSULIN UP TO 4 TIMES DAILY. 03/06/17   Cassandria Anger, MD    Current Facility-Administered Medications  Medication Dose Route Frequency Provider Last Rate Last Dose  . 0.9 %  sodium chloride infusion  250 mL Intravenous PRN [name], MD      . acetaminophen (TYLENOL) tablet 650 mg  650 mg Oral Q6H PRN [name], MD   650 mg at 09/03/18 8185   Or  . acetaminophen (TYLENOL) suppository 650 mg  650 mg Rectal Q6H PRN [name], MD      . albuterol (PROVENTIL) (2.5 MG/3ML) 0.083%  nebulizer solution 3 mL  3 mL Inhalation Q4H PRN [name], MD      . ALPRAZolam Duanne Moron) tablet 1 mg  1 mg Oral TID PRN Wynetta Emery, Clanford L, MD   1 mg at 09/07/18 0900  . aspirin EC tablet 81 mg  81 mg Oral Daily [name], MD   81 mg at 09/07/18 0900  . atorvastatin (LIPITOR) tablet 80 mg  80 mg Oral q morning - 10a [name], MD   80 mg at 09/07/18 0859  . bisacodyl (DULCOLAX) EC tablet 10 mg  10 mg Oral Daily PRN Gardiner Barefoot, NP   10 mg at 09/06/18 1121  . cholecalciferol (VITAMIN D3) tablet 5,000 Units  5,000 Units Oral Daily [name], MD   5,000 Units at 09/07/18 0900  . cyanocobalamin ((VITAMIN B-12)) injection 1,000 mcg  1,000 mcg Intramuscular Daily Dana Allan I, MD   1,000 mcg at 09/06/18 2316  . ferrous sulfate tablet 325 mg  325 mg Oral Q breakfast [name], MD   325 mg at 09/07/18 0809  . furosemide (LASIX) injection 40 mg  40 mg Intravenous BID Orson Eva, MD   40 mg at 09/07/18 0809  . gabapentin (NEURONTIN) capsule 300 mg  300 mg Oral QID [name], MD   300 mg at 09/07/18 0859  . Gerhardt's butt cream 1 application  1 application Topical TID [name], MD   1 application at 63/14/97 0859  . heparin injection 5,000 Units  5,000 Units Subcutaneous Q8H [name], MD   5,000 Units at 09/07/18 [phone]  . insulin aspart (novoLOG) injection 0-15 Units  0-15 Units Subcutaneous TID WC Orson Eva, MD   3 Units at 09/07/18 0809  . insulin aspart (novoLOG) injection 0-5 Units  0-5 Units Subcutaneous QHS Tat, David, MD      . insulin aspart (novoLOG) injection 10 Units  10 Units Subcutaneous TID WC Johnson, Clanford L, MD   10 Units at 09/07/18 0809  . insulin detemir (LEVEMIR) injection 35 Units  35 Units Subcutaneous Benay Pike, MD   35 Units at 09/06/18 2310  . levothyroxine (SYNTHROID) tablet 200 mcg  200 mcg Oral QAC breakfast [name], MD   200 mcg at 09/07/18 7858  . nitroGLYCERIN (NITROSTAT) SL tablet 0.4 mg  0.4 mg Sublingual Q5 min PRN [name], MD       . nystatin (MYCOSTATIN/NYSTOP) topical powder   Topical  BID [name], MD      . omega-3 acid ethyl esters (LOVAZA) capsule 1 g  1 g Oral Daily [name], MD   1 g at 09/07/18 0859  . ondansetron (ZOFRAN) tablet 8 mg  8 mg Oral Q8H PRN [name], MD   8 mg at 09/05/18 0603  . oxyCODONE-acetaminophen (PERCOCET/ROXICET) 5-325 MG per tablet 1 tablet  1 tablet Oral Q6H PRN [name], MD   1 tablet at 09/07/18 5397   And  . oxyCODONE (Oxy IR/ROXICODONE) immediate release tablet 5 mg  5 mg Oral Q6H PRN [name], MD   5 mg at 09/07/18 6734  . pantoprazole (PROTONIX) EC tablet 40 mg  40 mg Oral BID [name], MD   40 mg at 09/07/18 0901  . polyethylene glycol (MIRALAX / GLYCOLAX) packet 17 g  17 g Oral BID Johnson, Clanford L, MD   17 g at 09/07/18 0857  . potassium chloride (K-DUR) CR tablet 20 mEq  20 mEq Oral BID [name], MD   20 mEq at 09/07/18 0900  . senna (SENOKOT) tablet 17.2 mg  2 tablet Oral Daily Dana Allan I, MD   17.2 mg at 09/07/18 0859  . sodium chloride flush (NS) 0.9 % injection 3 mL  3 mL Intravenous Q12H [name], MD   3 mL at 09/07/18 0902  . sodium chloride flush (NS) 0.9 % injection 3 mL  3 mL Intravenous PRN [name], MD      . tiZANidine (ZANAFLEX) tablet 2 mg  2 mg Oral Q8H PRN Wynetta Emery, Clanford L, MD   2 mg at 09/04/18 1825  . topiramate (TOPAMAX) tablet 150 mg  150 mg Oral QPM [name], MD   150 mg at 09/06/18 1657  . umeclidinium-vilanterol (ANORO ELLIPTA) 62.5-25 MCG/INH 1 puff  1 puff Inhalation Daily [name], MD   1 puff at 09/07/18 1937    Allergies as of 08/30/2018 - Review Complete 08/30/2018  Allergen Reaction Noted  . Dilaudid [hydromorphone hcl] Itching 02/25/2012  . Midodrine hcl Swelling 11/30/2017  . Actifed cold-allergy [chlorpheniramine-phenylephrine]  11/24/2014  . Doxycycline Nausea And Vomiting 03/24/2015  . Other Cough 11/24/2014  . Penicillins Hives and Itching 11/24/2014  . Reglan [metoclopramide] Itching 02/25/2012  .  Valium [diazepam] Itching 02/25/2012  . Vistaril [hydroxyzine hcl] Itching 02/25/2012    Family History  Problem Relation Age of Onset  . Stroke Mother   . Heart attack Father   . Diabetes Brother   . Cerebral palsy Brother   . Pneumonia Brother   . Diabetes Other   . Heart attack Other     Social History   Socioeconomic History  . Marital status: Widowed    Spouse name: Not on file  . Number of children: Not on file  . Years of education: Not on file  . Highest education level: Not on file  Occupational History  . Not on file  Social Needs  . Financial resource strain: Not on file  . Food insecurity:    Worry: Not on file    Inability: Not on file  . Transportation needs:    Medical: Not on file    Non-medical: Not on file  Tobacco Use  . Smoking status: Former Smoker    Packs/day: 0.25    Types: Cigarettes    Last attempt to quit: 11/24/2014    Years since quitting: 3.7  . Smokeless tobacco: Never Used  Substance and Sexual Activity  . Alcohol use: No  Alcohol/week: 0.0 standard drinks  . Drug use: No  . Sexual activity: Not Currently    Birth control/protection: Surgical  Lifestyle  . Physical activity:    Days per week: Not on file    Minutes per session: Not on file  . Stress: Not on file  Relationships  . Social connections:    Talks on phone: Not on file    Gets together: Not on file    Attends religious service: Not on file    Active member of club or organization: Not on file    Attends meetings of clubs or organizations: Not on file    Relationship status: Not on file  . Intimate partner violence:    Fear of current or ex partner: Not on file    Emotionally abused: Not on file    Physically abused: Not on file    Forced sexual activity: Not on file  Other Topics Concern  . Not on file  Social History Narrative  . Not on file    Review of Systems: Gen: see HPI CV: see HPI Resp: see HPI GI: see HPI GU : Denies urinary burning,  urinary frequency, urinary incontinence.  MS: chronic joint pain Derm: Denies rash, itching, dry skin Psych: Denies depression, anxiety,confusion, or memory loss Heme: Denies bruising, bleeding, and enlarged lymph nodes.  Physical Exam: Vital signs in last 24 hours: Temp:  [97.8 F (36.6 C)-98.3 F (36.8 C)] 97.9 F (36.6 C) (04/28 0646) Pulse Rate:  [58-80] 58 (04/28 0646) Resp:  [16-18] 16 (04/28 0646) BP: (127-155)/(42-98) 127/42 (04/28 0646) SpO2:  [95 %-99 %] 95 % (04/28 0733) Weight:  [914 kg] 137 kg (04/28 0646) Last BM Date: 09/06/18 General:   Alert,  Well-developed, well-nourished, pleasant and cooperative, appears fatigued. Weak vocal quality.  Head:  Normocephalic and atraumatic. Eyes:  Sclera clear, no icterus.    Ears:  Normal auditory acuity. Nose:  No deformity, discharge,  or lesions. Mouth:  No deformity or lesions Lungs:  Clear throughout to auscultation.   Heart:  S1 S2 present without obvious murmur Abdomen:  Soft, obese, large panniculus, +BS, no TTP, rebound, or guarding Rectal:  Deferred until time of colonoscopy.   Extremities:  With chronic lymphedema. Left lower leg with erythema, consistent with cellulitis Neurologic:  Alert and  oriented x4 Psych:  Alert and cooperative. Anxious and irritable at times  Intake/Output from previous day: 04/27 0701 - 04/28 0700 In: 1440 [P.O.:1440] Out: 3450 [Urine:3450] Intake/Output this shift: No intake/output data recorded.  Lab Results: Recent Labs    09/05/18 0547 09/07/18 0557  WBC 8.4 7.1  HGB 8.2* 7.8*  HCT 28.9* 27.1*  PLT 343 284   BMET Recent Labs    09/05/18 0547 09/06/18 0611 09/07/18 0557  NA 137 137 138  K 4.3 4.6 4.5  CL 102 101 103  CO2 28 28 28   GLUCOSE 140* 243* 174*  BUN 40* 45* 50*  CREATININE 1.26* 1.40* 1.29*  CALCIUM 8.7* 8.6* 8.4*   LFT Recent Labs    09/05/18 0547 09/06/18 0611  ALBUMIN 3.3* 2.8*   Lab Results  Component Value Date   IRON 9 (L) 08/30/2018    TIBC 431 08/30/2018   FERRITIN 4 (L) 08/30/2018     Impression: 67 year old female admitted with symptomatic anemia, acute heart failure exacerbation, receiving 2 units PRBCs on admission due to drop in baseline Hgb to 7 range and received IV Feraheme as well. Anemia multifactorial in setting of chronic IDA, chronic disease  contributing. Heme negative initially on admission, with subsequent hemoccult positive after she reports significant straining in setting of constipation. Remote history of colonoscopy/EGD per patient, but records are not in this system. She is without overt GI bleeding. No NSAIDs other than 81 mg aspirin, and she was taken off Plavix a few weeks ago per PCP.   Shortness of breath and DOE improved from admission but still bothersome to patient. She does not feel back to baseline. Initially, she was hesitant to pursue endoscopic procedures, but after further discussion, she would like to investigate acute on chronic anemia.  Vague intermittent N/V per patient, but she is without any other significant upper GI signs/symptoms, with GERD controlled on PPI BID and no dysphagia. Chronic constipation has been managed with OTC agents as outpatient, and she has had no overt GI bleeding. No family history of colorectal cancer, and she believes she may have had polyps in remote past. Will reassess tomorrow for readiness to pursue endoscopic evaluation. Discussed colonoscopy/EGD tentatively Thursday with Propofol.   Plan: Follow H/H Hold iron until after endoscopic procedures Reassess in the morning to determine timing of endoscopic evaluation Tentative colonoscopy/EGD with Propofol later this week   Annitta Needs, PhD, ANP-BC [facility]     LOS: 7 days    09/07/2018, 10:31 AM

## 2018-09-07 NOTE — Progress Notes (Signed)
Physical Therapy Treatment Patient Details Name: Stephanie Combs MRN: [id number] DOB: 1950-08-03 Today's Date: 09/07/2018    History of Present Illness Stephanie Combs  is a 68 y.o. female, w CAD s/p DES prox LAD, CHF (EF 60-65%), Dm2, CKD stage3, Hypertension, Hypothyroidism, Chronic lymphedema, apparently presents with c/o dyspnea for the past 5 days, along with increase in lower ext edema, refractory to increase in oral lasix. Slight mostly dry cough.  Pt denies fever, chills, cp, palp, abd pain, n/v, diarrhea,  Brbpr.  Pt is taking iron for her chronic anemia. Pt notes having had prior colonoscopy, normal.  Hx of diverticulosis.    PT Comments    Pt continues to be limited by Rt shoulder and BLE pain.  Noted significant reduction in erytherma BLE.  Supervision and min guard for transfer training and short duration gait with use of RW.  Pt ambulated to sink following restroom break, had to rest forearms on sink due to fatigue.  Required 1 seated rest break during gait due to fatigue and c/o SOB, O2 saturation at 97-98% with 2L O2A through nasal canal.  EOS pt requested to return to bed.  Pt left with call bell within reach, was limited by fatigue.     Follow Up Recommendations  Home health PT;Supervision for mobility/OOB;Supervision - Intermittent     Equipment Recommendations  None recommended by PT    Recommendations for Other Services       Precautions / Restrictions Precautions Precautions: Fall    Mobility  Bed Mobility               General bed mobility comments: increased time, use of bedrail  Transfers Overall transfer level: Needs assistance Equipment used: Rolling walker (2 wheeled) Transfers: Sit to/from Stand Sit to Stand: Supervision         General transfer comment: slow labored movement using armrest of chair  Ambulation/Gait Ambulation/Gait assistance: Min guard Gait Distance (Feet): 18 Feet Assistive device: Rolling walker (2 wheeled) Gait  Pattern/deviations: Decreased step length - right;Decreased step length - left;Decreased stride length     General Gait Details: slow labored cadence, required seated rest break on BSC following washing hands at sink, limited by fatigue and c/o SOB.  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[place] (Stroke Patients Only)       Balance                                            Cognition Arousal/Alertness: Awake/alert Behavior During Therapy: WFL for tasks assessed/performed Overall Cognitive Status: Within Functional Limits for tasks assessed                                        Exercises      General Comments        Pertinent Vitals/Pain Pain Score: 7  Pain Location: Rt shoulder and BLE Pain Descriptors / Indicators: Discomfort;Pressure Pain Intervention(s): Monitored during session;Limited activity within patient's tolerance;Premedicated before session    Home Living                      Prior Function  PT Goals (current goals can now be found in the care plan section)      Frequency    Min 3X/week      PT Plan Current plan remains appropriate    Co-evaluation              AM-PAC PT "6 Clicks" Mobility   Outcome Measure  Help needed turning from your back to your side while in a flat bed without using bedrails?: None Help needed moving from lying on your back to sitting on the side of a flat bed without using bedrails?: None Help needed moving to and from a bed to a chair (including a wheelchair)?: A Little Help needed standing up from a chair using your arms (e.g., wheelchair or bedside chair)?: A Little Help needed to walk in hospital room?: A Little Help needed climbing 3-5 steps with a railing? : A Lot 6 Click Score: 19    End of Session Equipment Utilized During Treatment: Oxygen Activity  Tolerance: Patient tolerated treatment well;Patient limited by fatigue Patient left: in bed;with call bell/phone within reach(requested to return to bed) Nurse Communication: Mobility status PT Visit Diagnosis: Unsteadiness on feet (R26.81);Other abnormalities of gait and mobility (R26.89);Muscle weakness (generalized) (M62.81)     Time: 2876-8115 PT Time Calculation (min) (ACUTE ONLY): 25 min  Charges:  $Therapeutic Activity: 23-37 mins                     [address], LPTA; CBIS [phone]  Aldona Lento 09/07/2018, 12:13 PM

## 2018-09-08 ENCOUNTER — Inpatient Hospital Stay (HOSPITAL_COMMUNITY): Payer: Medicare Other

## 2018-09-08 DIAGNOSIS — D649 Anemia, unspecified: Secondary | ICD-10-CM

## 2018-09-08 DIAGNOSIS — R195 Other fecal abnormalities: Secondary | ICD-10-CM | POA: Diagnosis present

## 2018-09-08 LAB — BASIC METABOLIC PANEL
Anion gap: 8 (ref 5–15)
BUN: 49 mg/dL — ABNORMAL HIGH (ref 8–23)
CO2: 29 mmol/L (ref 22–32)
Calcium: 8.8 mg/dL — ABNORMAL LOW (ref 8.9–10.3)
Chloride: 98 mmol/L (ref 98–111)
Creatinine, Ser: 1.28 mg/dL — ABNORMAL HIGH (ref 0.44–1.00)
GFR calc Af Amer: 50 mL/min — ABNORMAL LOW (ref >60)
GFR calc non Af Amer: 43 mL/min — ABNORMAL LOW (ref >60)
Glucose, Bld: 224 mg/dL — ABNORMAL HIGH (ref 70–99)
Potassium: 4.7 mmol/L (ref 3.5–5.1)
Sodium: 135 mmol/L (ref 135–145)

## 2018-09-08 LAB — GLUCOSE, CAPILLARY
Glucose-Capillary: 240 mg/dL — ABNORMAL HIGH (ref 70–99)
Glucose-Capillary: 216 mg/dL — ABNORMAL HIGH (ref 70–99)
Glucose-Capillary: 173 mg/dL — ABNORMAL HIGH (ref 70–99)
Glucose-Capillary: 149 mg/dL — ABNORMAL HIGH (ref 70–99)
Glucose-Capillary: 142 mg/dL — ABNORMAL HIGH (ref 70–99)

## 2018-09-08 LAB — CBC
HCT: 29.8 % — ABNORMAL LOW (ref 36.0–46.0)
Hemoglobin: 8.5 g/dL — ABNORMAL LOW (ref 12.0–15.0)
MCH: 24.9 pg — ABNORMAL LOW (ref 26.0–34.0)
MCHC: 28.5 g/dL — ABNORMAL LOW (ref 30.0–36.0)
MCV: 87.1 fL (ref 80.0–100.0)
Platelets: 318 10*3/uL (ref 150–400)
RBC: 3.42 MIL/uL — ABNORMAL LOW (ref 3.87–5.11)
RDW: 19.8 % — ABNORMAL HIGH (ref 11.5–15.5)
WBC: 8.7 10*3/uL (ref 4.0–10.5)
nRBC: 0 % (ref 0.0–0.2)

## 2018-09-08 MED ORDER — HEPARIN SODIUM (PORCINE) 5000 UNIT/ML IJ SOLN
5000.0000 [IU] | Freq: Three times a day (TID) | INTRAMUSCULAR | Status: AC
  Administered 2018-09-08 (×2): 5000 [IU] via SUBCUTANEOUS
  Filled 2018-09-08 (×2): qty 1

## 2018-09-08 MED ORDER — PEG 3350-KCL-NA BICARB-NACL 420 G PO SOLR
4000.0000 mL | Freq: Once | ORAL | Status: AC
  Administered 2018-09-08: 4000 mL via ORAL
  Filled 2018-09-08: qty 4000

## 2018-09-08 NOTE — Care Management Important Message (Signed)
Important Message  Patient Details  Name: Stephanie Combs MRN: [id number] Date of Birth: [birth year]   Medicare Important Message Given:  Yes    Tommy Medal 09/08/2018, 2:23 PM

## 2018-09-08 NOTE — Progress Notes (Addendum)
Subjective:  Patient says breathing is some better. Had BM last night. No overt GI bleeding.   Objective: Vital signs in last 24 hours: Temp:  [98 F (36.7 C)-98.5 F (36.9 C)] 98.5 F (36.9 C) (04/29 0523) Pulse Rate:  [61-77] 77 (04/29 0523) Resp:  [18-20] 18 (04/29 0523) BP: (116-122)/(43-57) 122/43 (04/29 0523) SpO2:  [94 %-100 %] 94 % (04/29 0523) Weight:  [139.1 kg] 139.1 kg (04/29 0540) Last BM Date: 09/07/18 General:   Alert,  Well-developed, well-nourished, pleasant and cooperative in NAD. Very talkative, nonstop. No SOB noted with speaking. Patient resting comfortable in bed.  Head:  Normocephalic and atraumatic. Eyes:  Sclera clear, no icterus.  Abdomen:  Soft, nontender and nondistended.   Extremities:  Without clubbing, deformity. +3+pitting edema with erythema overlying anterior lower leg, especially on the right LE. Neurologic:  Alert and  oriented x4;  grossly normal neurologically. Skin:  Intact without significant lesions or rashes. Psych:  Alert and cooperative. Normal mood and affect.  Intake/Output from previous day: 04/28 0701 - 04/29 0700 In: 842.2 [P.O.:720; I.V.:31.8; IV Piggyback:90.4] Out: 2425 [Urine:2425] Intake/Output this shift: No intake/output data recorded.  Lab Results: CBC Recent Labs    09/07/18 0557 09/08/18 0613  WBC 7.1 8.7  HGB 7.8* 8.5*  HCT 27.1* 29.8*  MCV 86.3 87.1  PLT 284 318   BMET Recent Labs    09/06/18 0611 09/07/18 0557 09/08/18 0613  NA 137 138 135  K 4.6 4.5 4.7  CL 101 103 98  CO2 28 28 29   GLUCOSE 243* 174* 224*  BUN 45* 50* 49*  CREATININE 1.40* 1.29* 1.28*  CALCIUM 8.6* 8.4* 8.8*   LFTs Recent Labs    09/06/18 0611  ALBUMIN 2.8*   No results for input(s): LIPASE in the last 72 hours. PT/INR No results for input(s): LABPROT, INR in the last 72 hours.    Imaging Studies: Dg Chest Portable 1 View  Result Date: 08/30/2018 CLINICAL DATA:  Short of breath EXAM: PORTABLE CHEST 1 VIEW COMPARISON:   07/05/2018 FINDINGS: Mild cardiac enlargement. Negative for heart failure or edema. Mild left lower lobe airspace disease and small left effusion. Right lung base clear. IMPRESSION: Mild left lower lobe atelectasis/infiltrate and small left effusion. Mild progression since the prior study. Electronically Signed   By: Franchot Gallo M.D.   On: 08/30/2018 21:33  [2 weeks]   Assessment:  68 y/o female with symptomatic anemia, acute heart failure exacerbation. Received 2 units of prbcs and IV feraheme. Anemia multifactorial in setting of chronic IDA, chronic disease. Initially heme negative, but subsequent heme + stool in setting of straining with constipation. No NSAIDS other than ASA 81mg  daily. Recently taken off Plavix.   Vague intermittent N/V. GERD well controlled. No dysphagia.   Acute on chronic diastolic CHF and chronic respiratory failure with hypoxia on home O2. Currently not at baseline but with noted improvement. Oxygenation at rest noted to be in the 95-100% range on O2 via [place].  Plan: 1. Consider colonoscopy/EGD tomorrow, likely with assistance from anesthesiology.  2. Begin bowel prep today. 3. Will hold heparin prior to procedure.   Laureen Ochs. Bernarda Caffey [facility] [phone] 4/29/20208:37 AM     LOS: 8 days    Addendum: spoke with patient, nursing staff. She does not have a legal guardian.   Laureen Ochs. Bernarda Caffey [facility] [phone] 4/29/20204:08 PM

## 2018-09-08 NOTE — Progress Notes (Signed)
Physical Therapy Note  Patient Details  Name: Stephanie Combs MRN: [id number] Date of Birth: 09/25/1950 Today's Date: 09/08/2018    Pt refused secondary to drinking prep to have colonoscopy tomorrow and scared she will have BM when ambulating.  Teena Irani, PTA/CLT [phone]    [name] 09/08/2018, 5:05 PM

## 2018-09-08 NOTE — Progress Notes (Signed)
PROGRESS NOTE    Stephanie Combs  VEH:[id number]  DOB: 09/26/1950  DOA: 08/30/2018 PCP: [name], MD   Brief Admission Hx: 68 year old female with CAD, diastolic CHF, type 2 diabetes, stage III CKD, chronic lymphedema presented with volume overload and dyspnea, acute on chronic diastolic CHF and found to have chronic blood loss anemia and GI bleeding.  MDM/Assessment & Plan:   1. Acute on chronic diastolic CHF-patient is clinically much improved.  She has diuresed nearly 15 L since admission.  We are monitoring her renal function very closely.  Her weight is down. 2. Chronic blood loss anemia-iron deficiency anemia-patient is status post 2 units PRBC transfusion.  She was Hemoccult positive.  GI has seen her and planning TCS/EGD 09/09/2018.  Patient is currently being prepped.  She has received Feraheme x1 dose on 09/05/2018.  Her hemoglobin is holding stable.  We will continue to follow. 3. Cellulitis left lower extremity-patient has been started on cefazolin IV for treatment. 4. Hypothyroidism-her TSH has been within normal limits and she is taking levothyroxine daily. 5. Type 2 diabetes mellitus, poorly controlled with hyperglycemia- her blood sugars are slightly improved with the adjustments made to her insulin regimen continue basal and prandial insulin. 6. Stage III CKD- her creatinine is holding stable in the setting of IV diuresis we are following closely. 7. Chronic respiratory failure with hypoxia- this is been stable on home oxygen supplementation. 8. COPD- continue bronchodilators.  This is been stable. 9. Chronic lymphedema-she has diuresed well with IV Lasix.  DVT prophylaxis: Subcu heparin  code Status: DNR Family Communication: Patient updated at bedside Disposition Plan: Home after procedures in 1 to 2 days   Consultants:  GI  Procedures:  TCS/EGD tentative plan for 09/09/2018  Antimicrobials:  Cefazolin 4/27>  Ceftriaxone 4/26-4/27  Subjective: Patient  again has multiple complaints saying that she has shortness of breath.  She is being prepped for TCS. Objective: Vitals:   09/07/18 2123 09/08/18 0523 09/08/18 0540 09/08/18 1353  BP: (!) 116/52 (!) 122/43  (!) 126/49  Pulse: 72 77  76  Resp: 20 18  16   Temp: 98.4 F (36.9 C) 98.5 F (36.9 C)  97.8 F (36.6 C)  TempSrc: Oral Oral  Oral  SpO2: 99% 94%  98%  Weight:   (!) 139.1 kg   Height:        Intake/Output Summary (Last 24 hours) at 09/08/2018 1624 Last data filed at 09/08/2018 1244 Gross per 24 hour  Intake 1082.17 ml  Output 2325 ml  Net -1242.83 ml   Filed Weights   09/06/18 0556 09/07/18 0646 09/08/18 0540  Weight: (!) 138.3 kg (!) 137 kg (!) 139.1 kg     REVIEW OF SYSTEMS  As per history otherwise all reviewed and reported negative  Exam:  General exam: Morbidly obese female sitting up in the bed she is in no apparent distress, cooperative. Respiratory system: Clear. No increased work of breathing. Cardiovascular system: S1 & S2 heard. No JVD, murmurs, gallops, clicks or pedal edema. Gastrointestinal system: Abdomen is nondistended, soft and nontender. Normal bowel sounds heard. Central nervous system: Alert and oriented. No focal neurological deficits. Extremities: 2+ edema bilateral lower extremities, mild erythema and likely cellulitis of the left lower extremity from the ankle proximal mid calf area.  Data Reviewed: Basic Metabolic Panel: Recent Labs  Lab 09/03/18 0502 09/04/18 0650 09/05/18 0547 09/06/18 0611 09/07/18 0557 09/08/18 0613  NA 137 136 137 137 138 135  K 3.9 4.2 4.3 4.6 4.5  4.7  CL 100 98 102 101 103 98  CO2 29 31 28 28 28 29   GLUCOSE 193* 165* 140* 243* 174* 224*  BUN 34* 37* 40* 45* 50* 49*  CREATININE 1.40* 1.36* 1.26* 1.40* 1.29* 1.28*  CALCIUM 8.4* 8.4* 8.7* 8.6* 8.4* 8.8*  MG  --  2.1  --   --   --   --   PHOS 4.6 4.0 3.7 4.7*  --   --    Liver Function Tests: Recent Labs  Lab 09/02/18 0500 09/03/18 0502 09/04/18 0650  09/05/18 0547 09/06/18 0611  AST 12*  --   --   --   --   ALT 10  --   --   --   --   ALKPHOS 97  --   --   --   --   BILITOT 0.6  --   --   --   --   PROT 5.8*  --   --   --   --   ALBUMIN 3.2* 2.9* 3.2* 3.3* 2.8*   No results for input(s): LIPASE, AMYLASE in the last 168 hours. No results for input(s): AMMONIA in the last 168 hours. CBC: Recent Labs  Lab 09/03/18 0502 09/04/18 0650 09/05/18 0547 09/07/18 0557 09/08/18 0613  WBC 7.1 7.8 8.4 7.1 8.7  NEUTROABS  --  4.0  --   --   --   HGB 8.1* 8.6* 8.2* 7.8* 8.5*  HCT 29.0* 30.6* 28.9* 27.1* 29.8*  MCV 86.1 85.7 85.8 86.3 87.1  PLT 347 347 343 284 318   Cardiac Enzymes: No results for input(s): CKTOTAL, CKMB, CKMBINDEX, TROPONINI in the last 168 hours. CBG (last 3)  Recent Labs    09/08/18 0722 09/08/18 1114 09/08/18 1607  GLUCAP 216* 173* 149*   Recent Results (from the past 240 hour(s))  MRSA PCR Screening     Status: None   Collection Time: 09/01/18  6:32 PM  Result Value Ref Range Status   MRSA by PCR NEGATIVE NEGATIVE Final    Comment:        The GeneXpert MRSA Assay (FDA approved for NASAL specimens only), is one component of a comprehensive MRSA colonization surveillance program. It is not intended to diagnose MRSA infection nor to guide or monitor treatment for MRSA infections. Performed at [facility], [address] [place], [place] [address]      Studies: US Venous Img Lower Bilateral  Result Date: 09/08/2018 CLINICAL DATA:  Lower extremity pain EXAM: BILATERAL LOWER EXTREMITY VENOUS DUPLEX ULTRASOUND TECHNIQUE: Doppler venous assessment of the bilateral lower extremity deep venous system was performed, including characterization of spectral flow, compressibility, and phasicity. COMPARISON:  None. FINDINGS: There is complete compressibility of the bilateral common femoral, femoral, and popliteal veins. Doppler analysis demonstrates respiratory phasicity and augmentation of flow with calf  compression. No obvious superficial vein or calf vein thrombosis. IMPRESSION: No evidence of lower extremity DVT. Electronically Signed   By: Marybelle Killings M.D.   On: 09/08/2018 13:27     Scheduled Meds: . aspirin EC  81 mg Oral Daily  . atorvastatin  80 mg Oral q morning - 10a  . cholecalciferol  5,000 Units Oral Daily  . cyanocobalamin  1,000 mcg Intramuscular Daily  . furosemide  40 mg Intravenous BID  . gabapentin  300 mg Oral QID  . Gerhardt's butt cream  1 application Topical TID  . heparin  5,000 Units Subcutaneous Q8H  . insulin aspart  0-15 Units Subcutaneous TID WC  .  insulin aspart  0-5 Units Subcutaneous QHS  . insulin aspart  5 Units Subcutaneous TID WC  . insulin detemir  20 Units Subcutaneous QHS  . levothyroxine  200 mcg Oral QAC breakfast  . nystatin   Topical BID  . omega-3 acid ethyl esters  1 g Oral Daily  . pantoprazole  40 mg Oral BID  . polyethylene glycol  17 g Oral BID  . potassium chloride  20 mEq Oral BID  . senna  2 tablet Oral BID WC  . sodium chloride flush  3 mL Intravenous Q12H  . topiramate  150 mg Oral QPM  . umeclidinium-vilanterol  1 puff Inhalation Daily   Continuous Infusions: . sodium chloride 10 mL/hr at 09/08/18 0536  .  ceFAZolin (ANCEF) IV 1 g (09/08/18 1357)    Principal Problem:   Anemia Active Problems:   DM type 2 (diabetes mellitus, type 2) (HCC)   HTN (hypertension), benign   CKD (chronic kidney disease), stage III (HCC)   Hypothyroidism   Bilateral lower extremity edema   Uncontrolled type 2 diabetes mellitus with stage 3 chronic kidney disease (HCC)   Obesity, Class III, BMI 40-49.9 (morbid obesity) (HCC)   CAD (coronary artery disease)   Symptomatic anemia   Acute on chronic diastolic CHF (congestive heart failure) (HCC)   Heme positive stool   Time spent:   Irwin Brakeman, MD [facility] 09/08/2018, 4:24 PM    LOS: 8 days  How to contact the [facility] Attending or Consulting provider [place] or covering  provider during after hours [place], for this patient?  1. Check the care team in [facility] and look for a) attending/consulting [facility] provider listed and b) the [facility] team listed 2. Log into [email or web address] and use [place] universal password to access. If you do not have the password, please contact the hospital operator. 3. Locate the [facility] provider you are looking for under [facility] and page to a number that you can be directly reached. 4. If you still have difficulty reaching the provider, please page the [facility] (Director on Call) for the Hospitalists listed on amion for assistance.

## 2018-09-09 ENCOUNTER — Inpatient Hospital Stay (HOSPITAL_COMMUNITY): Payer: Medicare Other | Admitting: Anesthesiology

## 2018-09-09 ENCOUNTER — Other Ambulatory Visit: Payer: Self-pay

## 2018-09-09 ENCOUNTER — Encounter (HOSPITAL_COMMUNITY)
Admission: EM | Disposition: A | Discharge status: Home Health | Payer: Self-pay | Source: Home / Self Care | Attending: Internal Medicine

## 2018-09-09 ENCOUNTER — Encounter (HOSPITAL_COMMUNITY): Payer: Self-pay

## 2018-09-09 DIAGNOSIS — K449 Diaphragmatic hernia without obstruction or gangrene: Secondary | ICD-10-CM

## 2018-09-09 DIAGNOSIS — K228 Other specified diseases of esophagus: Secondary | ICD-10-CM | POA: Diagnosis not present

## 2018-09-09 DIAGNOSIS — R1110 Vomiting, unspecified: Secondary | ICD-10-CM | POA: Diagnosis not present

## 2018-09-09 DIAGNOSIS — K621 Rectal polyp: Secondary | ICD-10-CM | POA: Diagnosis not present

## 2018-09-09 DIAGNOSIS — D124 Benign neoplasm of descending colon: Secondary | ICD-10-CM

## 2018-09-09 DIAGNOSIS — R1013 Epigastric pain: Secondary | ICD-10-CM | POA: Diagnosis present

## 2018-09-09 DIAGNOSIS — K3189 Other diseases of stomach and duodenum: Secondary | ICD-10-CM

## 2018-09-09 DIAGNOSIS — D509 Iron deficiency anemia, unspecified: Secondary | ICD-10-CM | POA: Diagnosis not present

## 2018-09-09 DIAGNOSIS — D128 Benign neoplasm of rectum: Secondary | ICD-10-CM

## 2018-09-09 DIAGNOSIS — D120 Benign neoplasm of cecum: Secondary | ICD-10-CM

## 2018-09-09 DIAGNOSIS — K6389 Other specified diseases of intestine: Secondary | ICD-10-CM | POA: Diagnosis not present

## 2018-09-09 DIAGNOSIS — E1165 Type 2 diabetes mellitus with hyperglycemia: Secondary | ICD-10-CM

## 2018-09-09 DIAGNOSIS — K635 Polyp of colon: Secondary | ICD-10-CM | POA: Diagnosis not present

## 2018-09-09 DIAGNOSIS — R112 Nausea with vomiting, unspecified: Secondary | ICD-10-CM

## 2018-09-09 DIAGNOSIS — C183 Malignant neoplasm of hepatic flexure: Secondary | ICD-10-CM

## 2018-09-09 DIAGNOSIS — K2270 Barrett's esophagus without dysplasia: Secondary | ICD-10-CM

## 2018-09-09 HISTORY — PX: COLONOSCOPY WITH PROPOFOL: SHX5780

## 2018-09-09 HISTORY — PX: ESOPHAGOGASTRODUODENOSCOPY (EGD) WITH PROPOFOL: SHX5813

## 2018-09-09 HISTORY — PX: BIOPSY: SHX5522

## 2018-09-09 HISTORY — PX: POLYPECTOMY: SHX5525

## 2018-09-09 LAB — CBC
HCT: 29.8 % — ABNORMAL LOW (ref 36.0–46.0)
Hemoglobin: 8.6 g/dL — ABNORMAL LOW (ref 12.0–15.0)
MCH: 25.1 pg — ABNORMAL LOW (ref 26.0–34.0)
MCHC: 28.9 g/dL — ABNORMAL LOW (ref 30.0–36.0)
MCV: 87.1 fL (ref 80.0–100.0)
Platelets: 303 10*3/uL (ref 150–400)
RBC: 3.42 MIL/uL — ABNORMAL LOW (ref 3.87–5.11)
RDW: 19.9 % — ABNORMAL HIGH (ref 11.5–15.5)
WBC: 8.2 10*3/uL (ref 4.0–10.5)
nRBC: 0 % (ref 0.0–0.2)

## 2018-09-09 LAB — RENAL FUNCTION PANEL
Albumin: 3.3 g/dL — ABNORMAL LOW (ref 3.5–5.0)
Anion gap: 10 (ref 5–15)
BUN: 43 mg/dL — ABNORMAL HIGH (ref 8–23)
CO2: 28 mmol/L (ref 22–32)
Calcium: 8.8 mg/dL — ABNORMAL LOW (ref 8.9–10.3)
Chloride: 100 mmol/L (ref 98–111)
Creatinine, Ser: 1.18 mg/dL — ABNORMAL HIGH (ref 0.44–1.00)
GFR calc Af Amer: 55 mL/min — ABNORMAL LOW (ref >60)
GFR calc non Af Amer: 47 mL/min — ABNORMAL LOW (ref >60)
Glucose, Bld: 143 mg/dL — ABNORMAL HIGH (ref 70–99)
Phosphorus: 4.1 mg/dL (ref 2.5–4.6)
Potassium: 4.1 mmol/L (ref 3.5–5.1)
Sodium: 138 mmol/L (ref 135–145)

## 2018-09-09 LAB — GLUCOSE, CAPILLARY
Glucose-Capillary: 129 mg/dL — ABNORMAL HIGH (ref 70–99)
Glucose-Capillary: 142 mg/dL — ABNORMAL HIGH (ref 70–99)
Glucose-Capillary: 168 mg/dL — ABNORMAL HIGH (ref 70–99)
Glucose-Capillary: 228 mg/dL — ABNORMAL HIGH (ref 70–99)

## 2018-09-09 SURGERY — COLONOSCOPY WITH PROPOFOL
Anesthesia: General

## 2018-09-09 MED ORDER — KETAMINE HCL 50 MG/5ML IJ SOSY
PREFILLED_SYRINGE | INTRAMUSCULAR | Status: AC
  Filled 2018-09-09: qty 5

## 2018-09-09 MED ORDER — PROPOFOL 10 MG/ML IV BOLUS
INTRAVENOUS | Status: DC | PRN
  Administered 2018-09-09: 10 mg via INTRAVENOUS
  Administered 2018-09-09: 30 mg via INTRAVENOUS
  Administered 2018-09-09: 10 mg via INTRAVENOUS
  Administered 2018-09-09: 20 mg via INTRAVENOUS
  Administered 2018-09-09: 10 mg via INTRAVENOUS
  Administered 2018-09-09 (×2): 20 mg via INTRAVENOUS

## 2018-09-09 MED ORDER — BISACODYL 5 MG PO TBEC
10.0000 mg | DELAYED_RELEASE_TABLET | Freq: Once | ORAL | Status: AC
  Administered 2018-09-09: 01:00:00 10 mg via ORAL
  Filled 2018-09-09: qty 2

## 2018-09-09 MED ORDER — KETAMINE HCL 50 MG/ML IJ SOLN
INTRAMUSCULAR | Status: DC | PRN
  Administered 2018-09-09 (×3): 10 mg via INTRAMUSCULAR

## 2018-09-09 MED ORDER — SPOT INK MARKER SYRINGE KIT
PACK | SUBMUCOSAL | Status: DC | PRN
  Administered 2018-09-09: 3 mL via SUBMUCOSAL

## 2018-09-09 MED ORDER — PROPOFOL 10 MG/ML IV BOLUS
INTRAVENOUS | Status: AC
  Filled 2018-09-09: qty 20

## 2018-09-09 MED ORDER — PEG 3350-KCL-NA BICARB-NACL 420 G PO SOLR
2000.0000 mL | Freq: Once | ORAL | Status: AC
  Administered 2018-09-09: 2000 mL via ORAL

## 2018-09-09 MED ORDER — SODIUM CHLORIDE 0.9% FLUSH
INTRAVENOUS | Status: AC
  Filled 2018-09-09: qty 40

## 2018-09-09 MED ORDER — PROPOFOL 500 MG/50ML IV EMUL
INTRAVENOUS | Status: DC | PRN
  Administered 2018-09-09: 150 ug/kg/min via INTRAVENOUS
  Administered 2018-09-09: 14:00:00 via INTRAVENOUS

## 2018-09-09 MED ORDER — LACTATED RINGERS IV SOLN
INTRAVENOUS | Status: DC
  Administered 2018-09-09: 1000 mL via INTRAVENOUS

## 2018-09-09 MED ORDER — SODIUM CHLORIDE 0.9 % IV SOLN: INTRAVENOUS | Status: DC

## 2018-09-09 MED ORDER — SODIUM CHLORIDE 0.45 % IV SOLN
INTRAVENOUS | Status: DC
  Administered 2018-09-09 – 2018-09-10 (×2): via INTRAVENOUS

## 2018-09-09 MED ORDER — SPOT INK MARKER SYRINGE KIT: PACK | SUBMUCOSAL | Status: AC

## 2018-09-09 NOTE — Progress Notes (Signed)
Administered 8 oz of golytely at 1050 am and will give another 8 oz of golytely 20 mins at 1110 am per Dr. Gala Romney verbal instructions/order. Patient had not completed full amount of golytely jug prep so procedure is postponed for a later time today so patient can complete bowl prep.   Ryan Ogborn Rica Mote, RN 09/09/18 10:58 AM

## 2018-09-09 NOTE — Op Note (Signed)
[facility] Patient Name: Stephanie Combs Procedure Date: 09/09/2018 12:44 PM MRN: [id number] Date of Birth: [birth year] Attending MD: Norvel Richards , MD CSN: [id number] Age: 68 Admit Type: Inpatient Procedure:                Upper GI endoscopy Indications:              Unexplained iron deficiency anemia, Heme positive                            stool Providers:                Norvel Richards, MD, Otis Peak B. Sharon Seller, RN,                            Raphael Gibney, Technician, Aram Candela Referring MD:              Medicines:                Propofol per Anesthesia Complications:            No immediate complications. Estimated Blood Loss:     Estimated blood loss was minimal. Procedure:                Pre-Anesthesia Assessment:                           - Prior to the procedure, a History and Physical                            was performed, and patient medications and                            allergies were reviewed. The patient's tolerance of                            previous anesthesia was also reviewed. The risks                            and benefits of the procedure and the sedation                            options and risks were discussed with the patient.                            All questions were answered, and informed consent                            was obtained. ASA Grade Assessment: II - A patient                            with mild systemic disease. After reviewing the                            risks and benefits, the patient was deemed in  satisfactory condition to undergo the procedure.                           After obtaining informed consent, the endoscope was                            passed under direct vision. Throughout the                            procedure, the patient's blood pressure, pulse, and                            oxygen saturations were monitored continuously. The   GIF-H190 ([id number]) scope was introduced through the                            and advanced to the second part of duodenum. The                            upper GI endoscopy was accomplished without                            difficulty. The patient tolerated the procedure                            well. Scope In: 1:42:40 PM Scope Out: 1:49:06 PM Total Procedure Duration: 0 hours 6 minutes 26 seconds  Findings:      Mucosal changes were found in the esophagus. 2.5 cm "island" of       salmon-colored epithelium oriented longitudinally just above the GE       junction. Also, undulating Z line. No esophagitis. No nodularity. Area       biopsied.      A small hiatal hernia was present.      A few erosions were found in the gastric antrum. No ulcer or       infiltrating process seen.      Localized mucosal changes characterized by erosion were found in the       duodenal bulb. Abnormal gastric mucosa was biopsied with a cold forceps       for histology. Estimated blood loss was minimal. Impression:               -Abnormal distal esophagus suspicious for Barrett's                            esophagus?"biopsied.                           - Small hiatal hernia.                           - Erosive gastropathy. Biopsied.                           -Duodenal bulbar erosions.. Moderate Sedation:      Moderate (conscious) sedation was personally administered by an       anesthesia professional. The following parameters were monitored: oxygen  saturation, heart rate, blood pressure, respiratory rate, EKG, adequacy       of pulmonary ventilation, and response to care. Recommendation:           - Return patient to hospital ward for ongoing care.                            See colonoscopy report. Procedure Code(s):        --- Professional ---                           [id number], Esophagogastroduodenoscopy, flexible,                            transoral; with biopsy, single or multiple Diagnosis  Code(s):        --- Professional ---                           K22.8, Other specified diseases of esophagus                           K31.89, Other diseases of stomach and duodenum                           K44.9, Diaphragmatic hernia without obstruction or                            gangrene                           D50.9, Iron deficiency anemia, unspecified                           R19.5, Other fecal abnormalities CPT copyright 2019 [employer] Association. All rights reserved. The codes documented in this report are preliminary and upon coder review may  be revised to meet current compliance requirements. Stephanie Combs. Stephanie Didonato, MD Norvel Richards, MD 09/09/2018 2:38:39 PM This report has been signed electronically. Number of Addenda: 0

## 2018-09-09 NOTE — Progress Notes (Signed)
Subjective:  Patient reports that she had little assistance with taking her bowel prep yesterday and that's why she hasn't finished it. She has 1/4 of jug still left. Per nursing staff, stools still brown/loose.   Objective: Vital signs in last 24 hours: Temp:  [97.6 F (36.4 C)-97.8 F (36.6 C)] 97.6 F (36.4 C) (04/30 0649) Pulse Rate:  [60-78] 78 (04/30 0649) Resp:  [16-18] 16 (04/30 0649) BP: (126-142)/(44-63) 142/63 (04/30 0649) SpO2:  [98 %-100 %] 98 % (04/30 0755) Last BM Date: 09/08/18 General:   Alert,  Well-developed, well-nourished, pleasant and cooperative in NAD Head:  Normocephalic and atraumatic. Eyes:  Sclera clear, no icterus.   Neurologic:  Alert and  oriented x4;  grossly normal neurologically. Psych:  Alert and cooperative. Normal mood and affect.  Intake/Output from previous day: 04/29 0701 - 04/30 0700 In: 2147.5 [P.O.:1740; I.V.:257.5; IV Piggyback:150] Out: 500 [Urine:500] Intake/Output this shift: No intake/output data recorded.  Lab Results: CBC Recent Labs    09/07/18 0557 09/08/18 0613 09/09/18 0425  WBC 7.1 8.7 8.2  HGB 7.8* 8.5* 8.6*  HCT 27.1* 29.8* 29.8*  MCV 86.3 87.1 87.1  PLT 284 318 303   BMET Recent Labs    09/07/18 0557 09/08/18 0613 09/09/18 0425  NA 138 135 138  K 4.5 4.7 4.1  CL 103 98 100  CO2 28 29 28   GLUCOSE 174* 224* 143*  BUN 50* 49* 43*  CREATININE 1.29* 1.28* 1.18*  CALCIUM 8.4* 8.8* 8.8*   LFTs Recent Labs    09/09/18 0425  ALBUMIN 3.3*   No results for input(s): LIPASE in the last 72 hours. PT/INR No results for input(s): LABPROT, INR in the last 72 hours.    Imaging Studies: US Venous Img Lower Bilateral  Result Date: 09/08/2018 CLINICAL DATA:  Lower extremity pain EXAM: BILATERAL LOWER EXTREMITY VENOUS DUPLEX ULTRASOUND TECHNIQUE: Doppler venous assessment of the bilateral lower extremity deep venous system was performed, including characterization of spectral flow, compressibility, and phasicity.  COMPARISON:  None. FINDINGS: There is complete compressibility of the bilateral common femoral, femoral, and popliteal veins. Doppler analysis demonstrates respiratory phasicity and augmentation of flow with calf compression. No obvious superficial vein or calf vein thrombosis. IMPRESSION: No evidence of lower extremity DVT. Electronically Signed   By: Marybelle Killings M.D.   On: 09/08/2018 13:27   Dg Chest Portable 1 View  Result Date: 08/30/2018 CLINICAL DATA:  Short of breath EXAM: PORTABLE CHEST 1 VIEW COMPARISON:  07/05/2018 FINDINGS: Mild cardiac enlargement. Negative for heart failure or edema. Mild left lower lobe airspace disease and small left effusion. Right lung base clear. IMPRESSION: Mild left lower lobe atelectasis/infiltrate and small left effusion. Mild progression since the prior study. Electronically Signed   By: Franchot Gallo M.D.   On: 08/30/2018 21:33  [2 weeks]   Assessment:  68 y/o female with symptomatic anemia, acute heart failure exacerbation. Received 2 units of prbcs and IV feraheme. Anemia multifactorial in setting of chronic IDA, chronic disease. Initially heme negative, but subsequent heme + stool in setting of straining with constipation. No NSAIDS other than ASA 81mg  daily. Recently taken off Plavix.   Vague intermittent N/V. GERD well controlled. No dysphagia. Complains of epig pain.   Acute on chronic diastolic CHF and chronic respiratory failure with hypoxia on home O2. Currently not at baseline but with noted improvement. Oxygenation at rest noted to be in the 95-100% range on O2 via [place].  Plan: 1. Plans for colonoscopy/EGD today. Patient has not  completed her bowel prep. Reinforced with patient the need for her to finish prep. Additional prep ordered as well. Instructed nursing staff Larene Beach and Hinton Dyer) of need to complete prep by 11 for procedure at 1.   Laureen Ochs. Bernarda Caffey [facility] [phone] 4/30/20209:55 AM     LOS: 9  days

## 2018-09-09 NOTE — Progress Notes (Signed)
Patient is taking belongings including a red clutch purse with cash and cards, keys and other documents and additional jewelry as well as jewelry including 4 gold rings, 2 bracelets, a gold necklace on her, and 2 cell phones with her to endoscopy.    D Kha Hari RN Tiffani [id number],IO

## 2018-09-09 NOTE — Significant Event (Signed)
09/09/2018 4:08 PM  Dr. Dudley Major made me aware of colonoscopy findings.  I spoke with surgeon Dr. Arnoldo Morale who has reviewed chart and agreed to consult.  He asked for a cardiology consult 5/1 for preoperative clearance and tentative plans for operative management on Monday 09/13/18.    Murvin Natal MD

## 2018-09-09 NOTE — Transfer of Care (Signed)
Immediate Anesthesia Transfer of Care Note  Patient: Stephanie Combs  Procedure(s) Performed: COLONOSCOPY WITH PROPOFOL (N/A ) ESOPHAGOGASTRODUODENOSCOPY (EGD) WITH PROPOFOL (N/A ) BIOPSY POLYPECTOMY  Patient Location: PACU  Anesthesia Type:General  Level of Consciousness: awake  Airway & Oxygen Therapy: Patient Spontanous Breathing  Post-op Assessment: Report given to RN  Post vital signs: Reviewed and stable  Last Vitals:  Vitals Value Taken Time  BP    Temp    Pulse    Resp    SpO2      Last Pain:  Vitals:   09/09/18 1336  TempSrc:   PainSc: 0-No pain      Patients Stated Pain Goal: 5 (85/27/78 2423)  Complications: No apparent anesthesia complications

## 2018-09-09 NOTE — Progress Notes (Signed)
RN paged Dr. Laural Golden to make him aware that patient is scheduled for colonoscopy in a.m. and has approximately 1/4 prep left to drink, stools are still brown/ black.  RN informed Dr. Laural Golden patient is ordered to have tap water enema in a.m.,  RN asking for MD to advise RN on what to do next to prep patient, awaiting response.  P.J. Linus Mako, RN

## 2018-09-09 NOTE — Progress Notes (Signed)
RN spoke to Dr. Laural Golden.  Orders received for 2 Dulcolax tablets PO and IV 1/2 NS @ 50 ml/hr.  RN will continue to monitor patient and make MD aware of any changes.  P.J. Linus Mako, RN

## 2018-09-09 NOTE — Progress Notes (Signed)
PT Cancellation Note  Patient Details Name: Stephanie Combs MRN: [id number] DOB: Dec 07, 1950   Cancelled Treatment:    Reason Eval/Treat Not Completed: Patient declined, no reason specified;Medical issues which prohibited therapy  Attemped PT session.  Pt declined stated she needs to stay close to [facility], taken medication preparing for colonoscopy.    [address] LPTA; [place] Aldona Lento 09/09/2018, 1:24 PM

## 2018-09-09 NOTE — Progress Notes (Signed)
Patient believes she would benefit from an incentive spirometry. Order received by Dr.Nabonsal. Instructed patient and goal given, only able to inhale and bring level to 900. Encouraged patient to use equipment often when awake. Verbalized understanding.

## 2018-09-09 NOTE — Anesthesia Preprocedure Evaluation (Signed)
Anesthesia Evaluation  Patient identified by MRN, date of birth, ID band Patient awake    Reviewed: Allergy & Precautions, NPO status , Patient's Chart, lab work & pertinent test results  Airway Mallampati: II  TM Distance: >3 FB Neck ROM: Full    Dental no notable dental hx. (+) Missing   Pulmonary shortness of breath, with exertion and Long-Term Oxygen Therapy, pneumonia, resolved, COPD,  COPD inhaler and oxygen dependent, former smoker,    Pulmonary exam normal breath sounds clear to auscultation       Cardiovascular Exercise Tolerance: Good hypertension, + angina with exertion + CAD and +CHF  Normal cardiovascular examII Rhythm:Regular Rate:Normal  Uses walker  Denies recent NTG use  States less SOB after diuresis   Neuro/Psych Anxiety negative neurological ROS  negative psych ROS   GI/Hepatic negative GI ROS, Neg liver ROS,   Endo/Other  diabetes, Well Controlled, Type 1, Insulin DependentHypothyroidism Morbid obesity  Renal/GU Renal InsufficiencyRenal disease  negative genitourinary   Musculoskeletal  (+) Arthritis ,   Abdominal   Peds negative pediatric ROS (+)  Hematology negative hematology ROS (+) anemia ,   Anesthesia Other Findings   Reproductive/Obstetrics negative OB ROS                             Anesthesia Physical Anesthesia Plan  ASA: IV  Anesthesia Plan: General   Post-op Pain Management:    Induction: Intravenous  PONV Risk Score and Plan:   Airway Management Planned: Nasal Cannula and Simple Face Mask  Additional Equipment:   Intra-op Plan:   Post-operative Plan:   Informed Consent: I have reviewed the patients History and Physical, chart, labs and discussed the procedure including the risks, benefits and alternatives for the proposed anesthesia with the patient or authorized representative who has indicated his/her understanding and acceptance.    Patient has DNR.  Suspend DNR.   Dental advisory given  Plan Discussed with: CRNA  Anesthesia Plan Comments: (D/w pt while in periop area will rescind DNR-voiced understanding -WTP Plan GA as tolerated -GETA as needed Plan full PPE use  Dr. Gala Romney deemed appropriate in light of current COVID concerns  Pt finished bowel prep @1120  am 1320 is earliest time to go, if prep is adequate )        Anesthesia Quick Evaluation

## 2018-09-09 NOTE — Progress Notes (Signed)
Spoke with patient's nurse, Hinton Dyer. Patient's bowel movements are not clear. Additional prep orders provided.   Laureen Ochs. Bernarda Caffey [facility] [phone] 4/30/20207:48 AM

## 2018-09-09 NOTE — Op Note (Addendum)
[facility] Patient Name: Stephanie Combs Procedure Date: 09/09/2018 1:54 PM MRN: [id number] Date of Birth: 1950-10-22 Attending MD: Norvel Richards , MD CSN: [id number] Age: 69 Admit Type: Inpatient Procedure:                Colonoscopy Indications:              Heme positive stool, Unexplained iron deficiency                            anemia Providers:                Norvel Richards, MD, Otis Peak B. Sharon Seller, RN,                            Raphael Gibney, Technician, Aram Candela Referring MD:              Medicines:                Propofol per Anesthesia Complications:            No immediate complications. Estimated Blood Loss:     Estimated blood loss was minimal. Procedure:                Pre-Anesthesia Assessment:                           - Prior to the procedure, a History and Physical                            was performed, and patient medications and                            allergies were reviewed. The patient's tolerance of                            previous anesthesia was also reviewed. The risks                            and benefits of the procedure and the sedation                            options and risks were discussed with the patient.                            All questions were answered, and informed consent                            was obtained. ASA Grade Assessment: II - A patient                            with mild systemic disease. After reviewing the                            risks and benefits, the patient was deemed in  satisfactory condition to undergo the procedure.                           After obtaining informed consent, the colonoscope                            was passed under direct vision. Throughout the                            procedure, the patient's blood pressure, pulse, and                            oxygen saturations were monitored continuously. The   [id number]-[id number] ([id number]) scope was introduced through                            the and advanced to the the cecum, identified by                            appendiceal orifice and ileocecal valve. Scope In: 1:57:34 PM Scope Out: 2:36:05 PM Scope Withdrawal Time: 0 hours 29 minutes 34 seconds  Total Procedure Duration: 0 hours 38 minutes 31 seconds  Findings:      The perianal and digital rectal examinations were normal. Few shallow       left-sided diverticula; exophytic bulky semilunar mass found in the area       of the hepatic flexure/ distal ascending segment. Multiple lobulated       sessile polyps adjacent to the mass scattered about extending all the       way to the base of the cecum - largest approximately 1.5 cm in       dimensions. The colon was circumferentially inked approximately 3 cm       distal to the colon mass; To the surgeon: removing everything, including       the inked margin, proximally, to include the cecum, will completely       resect the tumor and all polyps between mass and cecum. Colon mass was       biopsied multiple times. Polyps between mass and cecum were not       manipulated.      The patient also had 5 semi-pedunculated polyps in the area of the       splenic flexure ranging in size from 6 mm to 9 mm which were all removed       with cold snare technique.      In the rectum at 15 cm from anal verge, there was a 1.5 cm sessile polyp       on a fold which was removed completely with hot snare technique in       piecemeal fashion. Impression:               -Colon mass as described in the vicinity of the                            hepatic flexure - status post biopsy and inking.                            Polyps associated with  this tumor scattered all the                            way to the cecum were not removed; multiple splenic                            flexure and rectal polyps were removed as described                            above. Left-sided  diverticulosis. Moderate Sedation:      Moderate (conscious) sedation was personally administered by an       anesthesia professional. The following parameters were monitored: oxygen       saturation, heart rate, blood pressure, respiratory rate, EKG, adequacy       of pulmonary ventilation, and response to care. Recommendation:           - Return patient to hospital ward for observation.                            Surgery consult for right hemicolectomy. Follow-up                            on pathology. Right hemicolectomy to include the                            circumferentially inked margin of colon wall                            required to remove mass and all remaining polyps.                            See EGD report. I've paged Dr. Irwin Brakeman to                            discuss. I have informed patient I found a colon                            mass which likely represents colon cancer. Procedure Code(s):        --- Professional ---                           [id number], Colonoscopy, flexible; diagnostic, including                            collection of specimen(s) by brushing or washing,                            when performed (separate procedure) Diagnosis Code(s):        --- Professional ---                           R19.5, Other fecal abnormalities                           D50.9,  Iron deficiency anemia, unspecified CPT copyright 2019 [employer]. All rights reserved. The codes documented in this report are preliminary and upon coder review may  be revised to meet current compliance requirements. Cristopher Estimable. Aleia Larocca, MD Norvel Richards, MD 09/09/2018 2:54:45 PM This report has been signed electronically. Number of Addenda: 0

## 2018-09-09 NOTE — Anesthesia Postprocedure Evaluation (Signed)
Anesthesia Post Note  Patient: CARL BLEECKER  Procedure(s) Performed: COLONOSCOPY WITH PROPOFOL (N/A ) ESOPHAGOGASTRODUODENOSCOPY (EGD) WITH PROPOFOL (N/A ) BIOPSY POLYPECTOMY  Patient location during evaluation: PACU Anesthesia Type: General Level of consciousness: awake and alert and oriented Pain management: pain level controlled Vital Signs Assessment: post-procedure vital signs reviewed and stable Respiratory status: spontaneous breathing Cardiovascular status: blood pressure returned to baseline and stable Postop Assessment: patient able to bend at knees Anesthetic complications: no     Last Vitals:  Vitals:   09/09/18 1049 09/09/18 1106  BP:  (!) (P) 141/68  Pulse: 76   Resp: (!) 24   Temp:    SpO2: 98%     Last Pain:  Vitals:   09/09/18 1336  TempSrc:   PainSc: 0-No pain                 Nini Cavan

## 2018-09-09 NOTE — Progress Notes (Signed)
PROGRESS NOTE    Stephanie Combs  HYW:[id number]  DOB: 09/28/1950  DOA: 08/30/2018 PCP: [name], MD   Brief Admission Hx: 68 year old female with CAD, type 2 diabetes mellitus, stage III CKD, chronic lymphedema, diastolic CHF, presented with volume overload, acute on chronic diastolic CHF, dyspnea and chronic blood loss anemia with GI bleeding.  MDM/Assessment & Plan:   1. Acute on chronic diastolic CHF-patient is clinically much improved.  She has diuresed nearly 15 L since admission.  We are monitoring her renal function very closely.  Her weight is down. 2. Chronic blood loss anemia-iron deficiency anemia-patient is status post 2 units PRBC transfusion.  She was Hemoccult positive.  GI has seen her and planning TCS/EGD 09/09/2018.  Patient is currently being prepped.  She has received Feraheme x1 dose on 09/05/2018.  Her hemoglobin is holding stable.  We will continue to follow. 3. Cellulitis left lower extremity-patient has been started on cefazolin IV for treatment with improvement.  4. Chronic lymphedema - Pt has responded well to IV lasix.  Venous duplex studies negative for DVT.  5. Hypothyroidism-her TSH has been within normal limits and she is taking levothyroxine daily. 6. Type 2 diabetes mellitus, poorly controlled with hyperglycemia- her blood sugars are slightly improved with the adjustments made to her insulin regimen continue basal and prandial insulin. 7. Stage III CKD- her creatinine is holding stable in the setting of IV diuresis we are following closely. 8. Chronic respiratory failure with hypoxia- this is been stable on home oxygen supplementation. 9. COPD- continue bronchodilators.  This is been stable. 10. Chronic lymphedema-she has diuresed well with IV Lasix.  DVT prophylaxis: Subcu heparin  code Status: DNR Family Communication: Patient updated at bedside Disposition Plan: Home after procedures in 1 to 2 days   Consultants:  GI  Procedures:  TCS/EGD  plan for 09/09/2018  Antimicrobials:  Cefazolin 4/27>  Ceftriaxone 4/26-4/27   Subjective: Patient is currently preparing for EGD and colonoscopy.  She has been prepping for the last 12 hours.  Her stools are currently brownish liquid stool.  Objective: Vitals:   09/08/18 2300 09/09/18 0649 09/09/18 0755 09/09/18 1023  BP:  (!) 142/63    Pulse:  78    Resp:  16    Temp: 97.8 F (36.6 C) 97.6 F (36.4 C)    TempSrc: Oral Oral  (P) Oral  SpO2:  99% 98%   Weight:      Height:        Intake/Output Summary (Last 24 hours) at 09/09/2018 1040 Last data filed at 09/09/2018 0745 Gross per 24 hour  Intake 1907.5 ml  Output 500 ml  Net 1407.5 ml   Filed Weights   09/06/18 0556 09/07/18 0646 09/08/18 0540  Weight: (!) 138.3 kg (!) 137 kg (!) 139.1 kg     REVIEW OF SYSTEMS  As per history otherwise all reviewed and reported negative  Exam:  General exam: morbidly obese female, sitting on BSC in NAD.  Respiratory system: Clear. No increased work of breathing. Cardiovascular system: S1 & S2 heard. No JVD, murmurs, gallops, clicks or pedal edema. Gastrointestinal system: Abdomen is nondistended, soft and nontender. Normal bowel sounds heard. Central nervous system: Alert and oriented. No focal neurological deficits. Extremities: 2+ edema BLE chronic lymphedema.  Data Reviewed: Basic Metabolic Panel: Recent Labs  Lab 09/03/18 0502 09/04/18 0650 09/05/18 0547 09/06/18 0611 09/07/18 0557 09/08/18 0613 09/09/18 0425  NA 137 136 137 137 138 135 138  K 3.9 4.2 4.3 4.6  4.5 4.7 4.1  CL 100 98 102 101 103 98 100  CO2 29 31 28 28 28 29 28   GLUCOSE 193* 165* 140* 243* 174* 224* 143*  BUN 34* 37* 40* 45* 50* 49* 43*  CREATININE 1.40* 1.36* 1.26* 1.40* 1.29* 1.28* 1.18*  CALCIUM 8.4* 8.4* 8.7* 8.6* 8.4* 8.8* 8.8*  MG  --  2.1  --   --   --   --   --   PHOS 4.6 4.0 3.7 4.7*  --   --  4.1   Liver Function Tests: Recent Labs  Lab 09/03/18 0502 09/04/18 0650 09/05/18 0547  09/06/18 0611 09/09/18 0425  ALBUMIN 2.9* 3.2* 3.3* 2.8* 3.3*   No results for input(s): LIPASE, AMYLASE in the last 168 hours. No results for input(s): AMMONIA in the last 168 hours. CBC: Recent Labs  Lab 09/04/18 0650 09/05/18 0547 09/07/18 0557 09/08/18 0613 09/09/18 0425  WBC 7.8 8.4 7.1 8.7 8.2  NEUTROABS 4.0  --   --   --   --   HGB 8.6* 8.2* 7.8* 8.5* 8.6*  HCT 30.6* 28.9* 27.1* 29.8* 29.8*  MCV 85.7 85.8 86.3 87.1 87.1  PLT 347 343 284 318 303   Cardiac Enzymes: No results for input(s): CKTOTAL, CKMB, CKMBINDEX, TROPONINI in the last 168 hours. CBG (last 3)  Recent Labs    09/08/18 1607 09/08/18 2153 09/09/18 0729  GLUCAP 149* 142* 129*   Recent Results (from the past 240 hour(s))  MRSA PCR Screening     Status: None   Collection Time: 09/01/18  6:32 PM  Result Value Ref Range Status   MRSA by PCR NEGATIVE NEGATIVE Final    Comment:        The GeneXpert MRSA Assay (FDA approved for NASAL specimens only), is one component of a comprehensive MRSA colonization surveillance program. It is not intended to diagnose MRSA infection nor to guide or monitor treatment for MRSA infections. Performed at [facility], [address], [place], [place] [address]      Studies: US Venous Img Lower Bilateral  Result Date: 09/08/2018 CLINICAL DATA:  Lower extremity pain EXAM: BILATERAL LOWER EXTREMITY VENOUS DUPLEX ULTRASOUND TECHNIQUE: Doppler venous assessment of the bilateral lower extremity deep venous system was performed, including characterization of spectral flow, compressibility, and phasicity. COMPARISON:  None. FINDINGS: There is complete compressibility of the bilateral common femoral, femoral, and popliteal veins. Doppler analysis demonstrates respiratory phasicity and augmentation of flow with calf compression. No obvious superficial vein or calf vein thrombosis. IMPRESSION: No evidence of lower extremity DVT. Electronically Signed   By: Marybelle Killings M.D.   On:  09/08/2018 13:27   Scheduled Meds: . [MAR Hold] aspirin EC  81 mg Oral Daily  . [MAR Hold] atorvastatin  80 mg Oral q morning - 10a  . [MAR Hold] cholecalciferol  5,000 Units Oral Daily  . [MAR Hold] cyanocobalamin  1,000 mcg Intramuscular Daily  . [MAR Hold] furosemide  40 mg Intravenous BID  . [MAR Hold] gabapentin  300 mg Oral QID  . [MAR Hold] Gerhardt's butt cream  1 application Topical TID  . [MAR Hold] insulin aspart  0-15 Units Subcutaneous TID WC  . [MAR Hold] insulin aspart  0-5 Units Subcutaneous QHS  . [MAR Hold] insulin aspart  5 Units Subcutaneous TID WC  . [MAR Hold] insulin detemir  20 Units Subcutaneous QHS  . [MAR Hold] levothyroxine  200 mcg Oral QAC breakfast  . [MAR Hold] nystatin   Topical BID  . [facility]  Hold] omega-3 acid ethyl esters  1 g Oral Daily  . [MAR Hold] pantoprazole  40 mg Oral BID  . [MAR Hold] polyethylene glycol  17 g Oral BID  . [MAR Hold] potassium chloride  20 mEq Oral BID  . [MAR Hold] senna  2 tablet Oral BID WC  . [MAR Hold] sodium chloride flush  3 mL Intravenous Q12H  . [MAR Hold] topiramate  150 mg Oral QPM  . [MAR Hold] umeclidinium-vilanterol  1 puff Inhalation Daily   Continuous Infusions: . sodium chloride 50 mL/hr at 09/09/18 0121  . [MAR Hold] sodium chloride 10 mL/hr at 09/08/18 0536  . [MAR Hold]  ceFAZolin (ANCEF) IV 1 g (09/09/18 0630)    Principal Problem:   Anemia Active Problems:   DM type 2 (diabetes mellitus, type 2) (HCC)   HTN (hypertension), benign   CKD (chronic kidney disease), stage III (HCC)   Hypothyroidism   Bilateral lower extremity edema   Uncontrolled type 2 diabetes mellitus with stage 3 chronic kidney disease (HCC)   Obesity, Class III, BMI 40-49.9 (morbid obesity) (HCC)   CAD (coronary artery disease)   Symptomatic anemia   Acute on chronic diastolic CHF (congestive heart failure) (HCC)   Heme positive stool   Non-intractable vomiting   Abdominal pain, epigastric   Time spent:   Irwin Brakeman, MD [facility] 09/09/2018, 10:40 AM    LOS: 9 days  How to contact the [facility] Attending or Consulting provider [place] or covering provider during after hours 7P -7A, for this patient?  1. Check the care team in [facility] and look for a) attending/consulting TR[facility] provider listed and b) the [facility] team listed 2. Log into [email or web address] and use [place] universal password to access. If you do not have the password, please contact the hospital operator. 3. Locate the [facility] provider you are looking for under [facility] and page to a number that you can be directly reached. 4. If you still have difficulty reaching the provider, please page the [facility] (Director on Call) for the Hospitalists listed on amion for assistance.

## 2018-09-10 DIAGNOSIS — L03116 Cellulitis of left lower limb: Secondary | ICD-10-CM | POA: Clinically undetermined

## 2018-09-10 DIAGNOSIS — M79606 Pain in leg, unspecified: Secondary | ICD-10-CM

## 2018-09-10 DIAGNOSIS — K635 Polyp of colon: Secondary | ICD-10-CM | POA: Diagnosis present

## 2018-09-10 DIAGNOSIS — I5033 Acute on chronic diastolic (congestive) heart failure: Secondary | ICD-10-CM

## 2018-09-10 DIAGNOSIS — K6389 Other specified diseases of intestine: Secondary | ICD-10-CM | POA: Diagnosis present

## 2018-09-10 DIAGNOSIS — Z01810 Encounter for preprocedural cardiovascular examination: Secondary | ICD-10-CM

## 2018-09-10 DIAGNOSIS — I25119 Atherosclerotic heart disease of native coronary artery with unspecified angina pectoris: Secondary | ICD-10-CM

## 2018-09-10 LAB — GLUCOSE, CAPILLARY
Glucose-Capillary: 158 mg/dL — ABNORMAL HIGH (ref 70–99)
Glucose-Capillary: 196 mg/dL — ABNORMAL HIGH (ref 70–99)
Glucose-Capillary: 215 mg/dL — ABNORMAL HIGH (ref 70–99)
Glucose-Capillary: 283 mg/dL — ABNORMAL HIGH (ref 70–99)

## 2018-09-10 NOTE — Progress Notes (Signed)
Patient transferred from  [place] to [place] room 17. Patient alert oriented times 4, VS stable. Patient is in bed locked at low position, bedside table, call light and telephone at patient reach. Will continue to monitor patient.

## 2018-09-10 NOTE — Consult Note (Signed)
Reason for Consult:Colon mass Referring Physician: Wynetta Emery, MD  Stephanie Combs is an 68 y.o. female.  HPI: This is a 68 year old female with multiple significant medical comorbidities who was admitted to [facility] on 08/30/18 with dyspnea, fluid overload, and significant anemia.  She has had a prolonged admission there, but finally underwent colonoscopy on 09/09/18.  She has a colon mass near the hepatic flexure, which was biopsied and tattooed. Biopsy is still pending.  Her family wants her transferred to [facility] for possible surgery due to her high risk.  Past Medical History:  Diagnosis Date  . Anxiety   . CAD (coronary artery disease)    a. s/p DES to LAD and angioplasty to D1 in 05/2017  . CHF (congestive heart failure) (HCC)    Diastolic  . Chronic back pain   . Chronic pain   . COPD (chronic obstructive pulmonary disease) ([place])   . Degenerative disk disease   . Diabetes mellitus without complication ([place])   . History of medication noncompliance 11/2017   "the patient frequently self-adjusts medications without notifying her physicians"  . Hypertension   . Hypothyroidism   . On home O2    2.5 L N/C prn  . Pedal edema     Past Surgical History:  Procedure Laterality Date  . CORONARY STENT INTERVENTION N/A 05/22/2017   Procedure: CORONARY STENT INTERVENTION;  Surgeon: Martinique, Peter M, MD;  Location: [place] [facility];  Service: Cardiovascular;  Laterality: N/A;  . HERNIA REPAIR    . RIGHT/LEFT HEART CATH AND CORONARY ANGIOGRAPHY N/A 05/19/2017   Procedure: RIGHT/LEFT HEART CATH AND CORONARY ANGIOGRAPHY;  Surgeon: Leonie Man, MD;  Location: [place] [facility];  Service: Cardiovascular;  Laterality: N/A;  . TUBAL LIGATION      Family History  Problem Relation Age of Onset  . Stroke Mother        deceased 80   . Heart attack Father        deceased 56   . Diabetes Brother   . Cerebral palsy Brother   . Pneumonia Brother   . Diabetes Other   . Heart attack Other    . Colon cancer Neg Hx     Social History:  reports that she quit smoking about 3 years ago. Her smoking use included cigarettes. She smoked 0.25 packs per day. She has never used smokeless tobacco. She reports that she does not drink alcohol or use drugs.  Allergies:  Allergies  Allergen Reactions  . Dilaudid [Hydromorphone Hcl] Itching  . Midodrine Hcl Swelling    After one dose had anaphylactic reaction, had to call EMS.  Marland Kitchen Actifed Cold-Allergy [Chlorpheniramine-Phenylephrine]     "I was sick and red and it didn't agree with me at all"  . Doxycycline Nausea And Vomiting  . Other Cough    Pt states she is allergic to ragweed and that she starts coughing and sneezing like crazy  . Penicillins Hives and Itching    Tolerates Rocephin Has patient had a PCN reaction causing immediate rash, facial/tongue/throat swelling, SOB or lightheadedness with hypotension: Yes Has patient had a PCN reaction causing severe rash involving mucus membranes or skin necrosis: No Has patient had a PCN reaction that required hospitalization Yes Has patient had a PCN reaction occurring within the last 10 years: No If all of the above answers are "NO", then may proceed with Cephalosporin use.   . Reglan [Metoclopramide] Itching  . Valium [Diazepam] Itching  . Vistaril [  Hydroxyzine Hcl] Itching    Medications:  Prior to Admission medications   Medication Sig Start Date End Date Taking? Authorizing Provider  albuterol (PROAIR HFA) 108 (90 Base) MCG/ACT inhaler Inhale 2 puffs into the lungs every 4 (four) hours as needed. For shortness of breath 04/09/18  Yes Johnson, Clanford L, MD  albuterol (PROVENTIL) (2.5 MG/3ML) 0.083% nebulizer solution Take 2.5 mg by nebulization 2 (two) times daily as needed for wheezing or shortness of breath.    Yes [provider]  ALPRAZolam Duanne Moron) 1 MG tablet Take 1 mg by mouth 3 (three) times daily as needed for anxiety or sleep.  01/05/17  Yes [provider]   aspirin EC 81 MG tablet Take 81 mg by mouth daily.   Yes [provider]  atorvastatin (LIPITOR) 80 MG tablet Take 1 tablet (80 mg total) by mouth daily at 6 PM. Patient taking differently: Take 80 mg by mouth every morning.  05/26/17  Yes [name], MD  Cholecalciferol (VITAMIN D3) 5000 units CAPS Take 1 capsule (5,000 Units total) by mouth daily. 08/19/16  Yes Nida, Marella Chimes, MD  docusate sodium (COLACE) 100 MG capsule Take 300 mg by mouth daily.   Yes [provider]  ferrous sulfate 325 (65 FE) MG tablet Take 1 tablet (325 mg total) by mouth daily with breakfast. 02/05/17  Yes [name], MD  furosemide (LASIX) 20 MG tablet Take 40mg  po in the morning and 20mg  in the evening Patient taking differently: Take 20-40 mg by mouth See admin instructions. Take 40mg  po in the morning and 20mg  in the evening 08/25/18  Yes Strader, [name] M, PA-C  gabapentin (NEURONTIN) 600 MG tablet Take 600 mg by mouth 4 (four) times daily.   Yes [provider]  Hydrocortisone (GERHARDT'S BUTT CREAM) CREA Apply 1 application topically 3 (three) times daily. 05/26/17  Yes [name], MD  insulin aspart (NOVOLOG) 100 UNIT/ML injection Inject 1-18 Units into the skin 3 (three) times daily with meals. Patient taking differently: Inject 1-20 Units into the skin 3 (three) times daily with meals.  04/09/18  Yes Johnson, Clanford L, MD  insulin detemir (LEVEMIR) 100 UNIT/ML injection Inject 0.4 mLs (40 Units total) into the skin at bedtime. 11/26/17  Yes Tat, Shanon Brow, MD  levothyroxine (SYNTHROID, LEVOTHROID) 200 MCG tablet Take 1 tablet by mouth daily. 06/24/18  Yes [provider]  nitroGLYCERIN (NITROSTAT) 0.4 MG SL tablet Place 0.4 mg under the tongue every 5 (five) minutes as needed for chest pain.  03/26/18  Yes [provider]  nystatin (MYCOSTATIN/NYSTOP) powder Apply topically 2 (two) times daily. 10/10/16  Yes Eber Jones, MD  Omega-3 Fatty Acids (FISH OIL PO)  Take 1 capsule by mouth daily.    Yes [provider]  ondansetron (ZOFRAN) 8 MG tablet Take 8 mg by mouth every 8 (eight) hours as needed for nausea or vomiting.  08/11/18  Yes [provider]  oxyCODONE-acetaminophen (PERCOCET) 10-325 MG tablet Take 1 tablet by mouth every 6 (six) hours as needed for pain.   Yes [provider]  OXYGEN Inhale 2 L into the lungs continuous.   Yes [provider]  pantoprazole (PROTONIX) 40 MG tablet Take 1 tablet (40 mg total) by mouth 2 (two) times daily. 08/25/18  Yes Strader, [name] M, PA-C  potassium chloride (MICRO-K) 10 MEQ CR capsule Take 2 capsules (20 mEq total) by mouth 2 (two) times daily. *May take one additional tablet as needed for cramping 04/23/18  Yes  Herminio Commons, MD  tiZANidine (ZANAFLEX) 4 MG tablet Take 1 tablet (4 mg total) by mouth every 8 (eight) hours as needed for muscle spasms. 07/08/18  Yes Kathie Dike, MD  topiramate (TOPAMAX) 50 MG tablet Take 150 mg by mouth every evening.  03/18/18  Yes [provider]  umeclidinium-vilanterol (ANORO ELLIPTA) 62.5-25 MCG/INH AEPB Inhale 1 puff into the lungs daily. Patient taking differently: Inhale 1 puff into the lungs daily as needed (for shortness of breath).  05/26/17  Yes [name], MD  SURE COMFORT INS SYR 1CC/28G 28G X 1/2" 1 ML MISC USE TO INJECT INSULIN UP TO 4 TIMES DAILY. 03/06/17   Cassandria Anger, MD     Results for orders placed or performed during the hospital encounter of 08/30/18 (from the past 48 hour(s))  Glucose, capillary     Status: Abnormal   Collection Time: 09/08/18  9:53 PM  Result Value Ref Range   Glucose-Capillary 142 (H) 70 - 99 mg/dL  Renal function panel     Status: Abnormal   Collection Time: 09/09/18  4:25 AM  Result Value Ref Range   Sodium 138 135 - 145 mmol/L   Potassium 4.1 3.5 - 5.1 mmol/L   Chloride 100 98 - 111 mmol/L   CO2 28 22 - 32 mmol/L   Glucose, Bld 143 (H) 70 - 99 mg/dL   BUN 43 (H)  8 - 23 mg/dL   Creatinine, Ser 1.18 (H) 0.44 - 1.00 mg/dL   Calcium 8.8 (L) 8.9 - 10.3 mg/dL   Phosphorus 4.1 2.5 - 4.6 mg/dL   Albumin 3.3 (L) 3.5 - 5.0 g/dL   GFR calc non Af Amer 47 (L) >60 mL/min   GFR calc Af Amer 55 (L) >60 mL/min   Anion gap 10 5 - 15    Comment: Performed at [facility], [address] [place], [place] [address]  CBC     Status: Abnormal   Collection Time: 09/09/18  4:25 AM  Result Value Ref Range   WBC 8.2 4.0 - 10.5 K/uL   RBC 3.42 (L) 3.87 - 5.11 MIL/uL   Hemoglobin 8.6 (L) 12.0 - 15.0 g/dL   HCT 29.8 (L) 36.0 - 46.0 %   MCV 87.1 80.0 - 100.0 fL   MCH 25.1 (L) 26.0 - 34.0 pg   MCHC 28.9 (L) 30.0 - 36.0 g/dL   RDW 19.9 (H) 11.5 - 15.5 %   Platelets 303 150 - 400 K/uL   nRBC 0.0 0.0 - 0.2 %    Comment: Performed at [facility], [address], [place], [name] [address]  Glucose, capillary     Status: Abnormal   Collection Time: 09/09/18  7:29 AM  Result Value Ref Range   Glucose-Capillary 129 (H) 70 - 99 mg/dL  Glucose, capillary     Status: Abnormal   Collection Time: 09/09/18 12:03 PM  Result Value Ref Range   Glucose-Capillary 142 (H) 70 - 99 mg/dL  Glucose, capillary     Status: Abnormal   Collection Time: 09/09/18  4:14 PM  Result Value Ref Range   Glucose-Capillary 168 (H) 70 - 99 mg/dL  Glucose, capillary     Status: Abnormal   Collection Time: 09/09/18  9:38 PM  Result Value Ref Range   Glucose-Capillary 228 (H) 70 - 99 mg/dL  Glucose, capillary     Status: Abnormal   Collection Time: 09/10/18  7:22 AM  Result Value Ref Range   Glucose-Capillary 158 (H) 70 - 99  mg/dL  Glucose, capillary     Status: Abnormal   Collection Time: 09/10/18 11:40 AM  Result Value Ref Range   Glucose-Capillary 196 (H) 70 - 99 mg/dL  Glucose, capillary     Status: Abnormal   Collection Time: 09/10/18  6:04 PM  Result Value Ref Range   Glucose-Capillary 215 (H) 70 - 99 mg/dL    No results found.  Review of Systems  Constitutional: Negative for weight  loss.  HENT: Negative for ear discharge, ear pain, hearing loss and tinnitus.   Eyes: Negative for blurred vision, double vision, photophobia and pain.  Respiratory: Positive for shortness of breath. Negative for cough and sputum production.   Cardiovascular: Positive for leg swelling. Negative for chest pain.  Gastrointestinal: Positive for blood in stool. Negative for abdominal pain, nausea and vomiting.  Genitourinary: Negative for dysuria, flank pain, frequency and urgency.  Musculoskeletal: Negative for back pain, falls, joint pain, myalgias and neck pain.  Neurological: Negative for dizziness, tingling, sensory change, focal weakness, loss of consciousness and headaches.  Endo/Heme/Allergies: Does not bruise/bleed easily.  Psychiatric/Behavioral: Negative for depression, memory loss and substance abuse. The patient is not nervous/anxious.    Blood pressure (!) 117/46, pulse 66, temperature 98.3 F (36.8 C), temperature source Oral, resp. rate (!) 22, height 5\' 6"  (1.676 m), weight (!) 137.5 kg, SpO2 100 %. Physical Exam Morbidly obese female in NAD Eyes:  Pupils equal, round; sclera anicteric HENT:  Oral mucosa moist; good dentition  Neck:  No masses palpated, no thyromegaly Lungs - shallow bilateral breath sounds CV - RRR; edematous lower extremities Abd - obese, soft, non-tender Skin:  Warm, dry; no sign of jaundice Psychiatric - alert and oriented x 4; calm mood and affect  Assessment/Plan:  1.  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  CHF, chronic anemia, lymphedema, DM - cardiac clearance for possible surgery next week 3.  Pre-op staging - CEA level, CT chest, abd, pelvis 4.  COPD with chronic respiratory failure - optimize if possible prior to surgery 5.  Stage III CKD  Patient is at elevated risk for significant perioperative complications due to her multiple medical comorbidities, obesity, poor pulmonary function.  We will discuss this further  with the patient and her family prior to surgery next week.  Would not advance diet past clear liquids. May use chemical DVT prophylaxis. Consider transfusing to get Hgb closer to 10 before surgery.  Imogene Burn Janey Petron 09/10/2018, 7:35 PM

## 2018-09-10 NOTE — Progress Notes (Signed)
Stephanie Combs  CBJ:[id number]  DOB: 06/22/1950  DOA: 08/30/2018 PCP: [name], MD  Brief Admission Hx: 68 year old female admitted to [facility] for acute diastolic CHF exacerbation with shortness of breath and dyspnea and noted to have a significant anemia.  She has been diuresed with IV Lasix and has lost approximately 14 L since admission.  As part of her work-up for anemia she had a colonoscopy 09/09/2018 with findings of a colon mass.  The patient requested to be transferred to [facility] for her hemicolectomy due to her complex cardiac history.  MDM/Assessment & Plan:   1. Acute on chronic diastolic CHF- patient is improving with diuresis.  She has diuresed 14 L since admission and her weight is down.  Her renal function is holding stable and we are monitoring closely. 2. Chronic blood loss anemia with severe iron deficiency anemia- status post 2 units PRBC transfusion.  Is Hemoccult positive and had TCS/EGD 09/09/2018 with findings of a large colon mass. 3. Colon mass- patient requesting transfer to [facility] for operative management.  I have spoken with [facility] and [facility] in [place] who have agreed to consult when patient arrives at [facility] 4. Cellulitis left lower extremity-she is being treated with IV cefazolin with slow improvement secondary to chronic lymphedema. 5. Chronic lymphedema-patient is responding to IV Lasix diuresis.  Venous duplex studies have been negative for DVT. 6. Hypothyroidism- TSH within normal limits, continue levothyroxine daily. 7. Type 2 diabetes mellitus, poorly controlled with hyperglycemia-her blood sugars are improved with basal and prandial insulin plus supplemental sliding coverage. 8. Stage III CKD-we are monitoring her creatinine closely but it has been stable with IV diuresis. 9. Chronic respiratory failure with hypoxia- she has been stable on home oxygen supplementation and bronchodilators as needed. 10. COPD-stable  continue bronchodilators.   DVT prophylaxis: Subcu heparin Code Status: DNR Family Communication: Patient fully updated at bedside Disposition Plan: Transfer to [facility]   Consultants:  Cardiology  Procedures:  EGD/TCS 09/09/2018  Antimicrobials:  Cefazolin 4/27 >  Ceftriaxone 4/26-4/27  Subjective: Patient says she is emotionally exhausted but she is willing to have surgery and other treatments for her colon mass.  She denies chest pain and shortness of breath.  She denies fever and chills.  Objective: Vitals:   09/09/18 2305 09/10/18 0552 09/10/18 0600 09/10/18 0727  BP: (!) 117/57 (!) 122/56    Pulse: 81 86    Resp: 17 16    Temp: 98.7 F (37.1 C) 99.1 F (37.3 C)    TempSrc: Oral Oral    SpO2: 100% 100%  95%  Weight:   (!) 140.3 kg   Height:        Intake/Output Summary (Last 24 hours) at 09/10/2018 1121 Last data filed at 09/10/2018 0600 Gross per 24 hour  Intake 1320 ml  Output 1600 ml  Net -280 ml   Filed Weights   09/07/18 0646 09/08/18 0540 09/10/18 0600  Weight: (!) 137 kg (!) 139.1 kg (!) 140.3 kg     REVIEW OF SYSTEMS  As per history otherwise all reviewed and reported negative  Exam:  General exam: Morbidly obese female sitting on BSC in no apparent distress. Respiratory system: Shallow bilateral but no crackles or wheezes heard. No increased work of breathing. Cardiovascular system: S1 & S2 heard. No JVD, murmurs, gallops, clicks or pedal edema. Gastrointestinal system: Abdomen is nondistended, soft and nontender. Normal bowel sounds heard. Central nervous system: Alert and oriented. No  focal neurological deficits. Extremities: 2+ edema bilateral lower extremities with chronic lymphedema, mild erythema and redness of left lower extremity consistent with superficial skin cellulitis, no drainage or weeping seen.  Data Reviewed: Basic Metabolic Panel: Recent Labs  Lab 09/04/18 0650 09/05/18 0547 09/06/18 0611 09/07/18 0557 09/08/18  0613 09/09/18 0425  NA 136 137 137 138 135 138  K 4.2 4.3 4.6 4.5 4.7 4.1  CL 98 102 101 103 98 100  CO2 31 28 28 28 29 28   GLUCOSE 165* 140* 243* 174* 224* 143*  BUN 37* 40* 45* 50* 49* 43*  CREATININE 1.36* 1.26* 1.40* 1.29* 1.28* 1.18*  CALCIUM 8.4* 8.7* 8.6* 8.4* 8.8* 8.8*  MG 2.1  --   --   --   --   --   PHOS 4.0 3.7 4.7*  --   --  4.1   Liver Function Tests: Recent Labs  Lab 09/04/18 0650 09/05/18 0547 09/06/18 0611 09/09/18 0425  ALBUMIN 3.2* 3.3* 2.8* 3.3*   No results for input(s): LIPASE, AMYLASE in the last 168 hours. No results for input(s): AMMONIA in the last 168 hours. CBC: Recent Labs  Lab 09/04/18 0650 09/05/18 0547 09/07/18 0557 09/08/18 0613 09/09/18 0425  WBC 7.8 8.4 7.1 8.7 8.2  NEUTROABS 4.0  --   --   --   --   HGB 8.6* 8.2* 7.8* 8.5* 8.6*  HCT 30.6* 28.9* 27.1* 29.8* 29.8*  MCV 85.7 85.8 86.3 87.1 87.1  PLT 347 343 284 318 303   Cardiac Enzymes: No results for input(s): CKTOTAL, CKMB, CKMBINDEX, TROPONINI in the last 168 hours. CBG (last 3)  Recent Labs    09/09/18 1614 09/09/18 2138 09/10/18 0722  GLUCAP 168* 228* 158*   Recent Results (from the past 240 hour(s))  MRSA PCR Screening     Status: None   Collection Time: 09/01/18  6:32 PM  Result Value Ref Range Status   MRSA by PCR NEGATIVE NEGATIVE Final    Comment:        The GeneXpert MRSA Assay (FDA approved for NASAL specimens only), is one component of a comprehensive MRSA colonization surveillance program. It is not intended to diagnose MRSA infection nor to guide or monitor treatment for MRSA infections. Performed at [facility], [address], [place], [place] [address]      Studies: US Venous Img Lower Bilateral  Result Date: 09/08/2018 CLINICAL DATA:  Lower extremity pain EXAM: BILATERAL LOWER EXTREMITY VENOUS DUPLEX ULTRASOUND TECHNIQUE: Doppler venous assessment of the bilateral lower extremity deep venous system was performed, including characterization of  spectral flow, compressibility, and phasicity. COMPARISON:  None. FINDINGS: There is complete compressibility of the bilateral common femoral, femoral, and popliteal veins. Doppler analysis demonstrates respiratory phasicity and augmentation of flow with calf compression. No obvious superficial vein or calf vein thrombosis. IMPRESSION: No evidence of lower extremity DVT. Electronically Signed   By: Marybelle Killings M.D.   On: 09/08/2018 13:27     Scheduled Meds: . aspirin EC  81 mg Oral Daily  . atorvastatin  80 mg Oral q morning - 10a  . cholecalciferol  5,000 Units Oral Daily  . cyanocobalamin  1,000 mcg Intramuscular Daily  . furosemide  40 mg Intravenous BID  . gabapentin  300 mg Oral QID  . Gerhardt's butt cream  1 application Topical TID  . insulin aspart  0-15 Units Subcutaneous TID WC  . insulin aspart  0-5 Units Subcutaneous QHS  . insulin aspart  5 Units Subcutaneous TID WC  .  insulin detemir  20 Units Subcutaneous QHS  . levothyroxine  200 mcg Oral QAC breakfast  . nystatin   Topical BID  . omega-3 acid ethyl esters  1 g Oral Daily  . pantoprazole  40 mg Oral BID  . polyethylene glycol  17 g Oral BID  . potassium chloride  20 mEq Oral BID  . senna  2 tablet Oral BID WC  . sodium chloride flush  3 mL Intravenous Q12H  . topiramate  150 mg Oral QPM  . umeclidinium-vilanterol  1 puff Inhalation Daily   Continuous Infusions: . sodium chloride 50 mL/hr at 09/10/18 0253  . sodium chloride 10 mL/hr at 09/08/18 0536  .  ceFAZolin (ANCEF) IV 1 g (09/10/18 2878)    Principal Problem:   Anemia Active Problems:   DM type 2 (diabetes mellitus, type 2) (HCC)   HTN (hypertension), benign   CKD (chronic kidney disease), stage III (HCC)   Hypothyroidism   Bilateral lower extremity edema   Uncontrolled type 2 diabetes mellitus with stage 3 chronic kidney disease (HCC)   Obesity, Class III, BMI 40-49.9 (morbid obesity) (HCC)   CAD (coronary artery disease)   Symptomatic anemia    Acute on chronic diastolic CHF (congestive heart failure) (HCC)   Heme positive stool   Non-intractable vomiting   Abdominal pain, epigastric  Time spent:   Irwin Brakeman, MD [facility] 09/10/2018, 11:21 AM    LOS: 10 days  How to contact the [facility] Attending or Consulting provider 7A - 7P or covering provider during after hours [place], for this patient?  1. Check the care team in [facility] and look for a) attending/consulting [facility] provider listed and b) the [facility] team listed 2. Log into [email or web address] and use [facility]'s universal password to access. If you do not have the password, please contact the hospital operator. 3. Locate the [facility] provider you are looking for under [facility] and page to a number that you can be directly reached. 4. If you still have difficulty reaching the provider, please page the [facility] (Director on Call) for the Hospitalists listed on amion for assistance.

## 2018-09-10 NOTE — Care Management Important Message (Signed)
Important Message  Patient Details  Name: Stephanie Combs MRN: [id number] Date of Birth: [birth year]   Medicare Important Message Given:  Yes    Tommy Medal 09/10/2018, 12:04 PM

## 2018-09-10 NOTE — Consult Note (Signed)
Cardiology Consult    Patient ID: VANDELLA ORD; [id number]; July 15, 1950   Admit date: 08/30/2018 Date of Consult: 09/10/2018  Primary Care Provider: [name], MD Primary Cardiologist: Kate Sable, MD   Patient Profile    Stephanie Combs is a 68 y.o. female with past medical history of CAD (s/p DES to [place] angioplasty to [place] 05/2017), chronic diastolic CHF, HTN, HLD,IDDM,COPD,Hypothyroidism,and Stage 3 CKD who is being seen today for the evaluation of preoperative cardiac clearance at the request of Dr. Wynetta Emery.   History of Present Illness    Stephanie Combs was most recently examined by myself on 08/25/2018 during a telehealth visit. She reported having baseline dyspnea on exertion but this was typically worse when she would remove her supplemental O2. Weight had been stable and she was continued on Lasix 40mg  in AM/20mg  in PM. She did report having constant chest discomfort which was worse with deep breathing and food consumption and not associated with exertion. Had been without her Protonix for several days and refills were provided.   She presented to [facility] ED 5 days later for worsening dyspnea and edema. BNP was elevated to 254 and CXR showed mild left lower love atelectasis/infiltrate with small left effusion. EKG appears to show sinus arrhythmia, HR 86, with PAC's. There is baseline artifact and a repeat tracing has not yet been obtained this admission. Was found to also be anemic with Hgb of 7.0 and was tranfused 2 units the day of admission. Was also started on IV Lasix 60mg  BID which has subsequently been reduced to 40mg  BID. She has an recorded output of -13.6L this admission yet weights have overall been stable (at 307 lbs on admission, at 309 lbs today?).   She was found to have heme-positive stools this admission and underwent EGD and colonoscopy on 4/30. EGD showed abnormal distal esophagus concerning for Barrett's and biopsies are pending along with noting a  small hiatal hernia and erosive gastropathy. Colonscopy showed an exophytic bulky semilunar mass in the area of the hepatic flexure/ distal ascending segment with biopsies pending. Surgical consult was recommended for likely hemicolectomy.  Patient is requesting transfer to [facility] for further assessment.   Past Medical History:  Diagnosis Date   Anxiety    CAD (coronary artery disease)    a. s/p DES to LAD and angioplasty to D1 in 05/2017   CHF (congestive heart failure) (HCC)    Diastolic   Chronic back pain    Chronic pain    COPD (chronic obstructive pulmonary disease) (HCC)    Degenerative disk disease    Diabetes mellitus without complication ([place])    History of medication noncompliance 11/2017   "the patient frequently self-adjusts medications without notifying her physicians"   Hypertension    Hypothyroidism    On home O2    2.5 L N/C prn   Pedal edema     Past Surgical History:  Procedure Laterality Date   CORONARY STENT INTERVENTION N/A 05/22/2017   Procedure: CORONARY STENT INTERVENTION;  Surgeon: Martinique, Peter M, MD;  Location: [place] [facility];  Service: Cardiovascular;  Laterality: N/A;   HERNIA REPAIR     RIGHT/LEFT HEART CATH AND CORONARY ANGIOGRAPHY N/A 05/19/2017   Procedure: RIGHT/LEFT HEART CATH AND CORONARY ANGIOGRAPHY;  Surgeon: Leonie Man, MD;  Location: [place] [facility];  Service: Cardiovascular;  Laterality: N/A;   TUBAL LIGATION       Home Medications:  Prior to Admission medications  Medication Sig Start Date End Date Taking? Authorizing Provider  albuterol (PROAIR HFA) 108 (90 Base) MCG/ACT inhaler Inhale 2 puffs into the lungs every 4 (four) hours as needed. For shortness of breath 04/09/18  Yes Johnson, Clanford L, MD  albuterol (PROVENTIL) (2.5 MG/3ML) 0.083% nebulizer solution Take 2.5 mg by nebulization 2 (two) times daily as needed for wheezing or shortness of breath.    Yes [provider]   ALPRAZolam Duanne Moron) 1 MG tablet Take 1 mg by mouth 3 (three) times daily as needed for anxiety or sleep.  01/05/17  Yes [provider]  aspirin EC 81 MG tablet Take 81 mg by mouth daily.   Yes [provider]  atorvastatin (LIPITOR) 80 MG tablet Take 1 tablet (80 mg total) by mouth daily at 6 PM. Patient taking differently: Take 80 mg by mouth every morning.  05/26/17  Yes [name], MD  Cholecalciferol (VITAMIN D3) 5000 units CAPS Take 1 capsule (5,000 Units total) by mouth daily. 08/19/16  Yes Nida, Marella Chimes, MD  docusate sodium (COLACE) 100 MG capsule Take 300 mg by mouth daily.   Yes [provider]  ferrous sulfate 325 (65 FE) MG tablet Take 1 tablet (325 mg total) by mouth daily with breakfast. 02/05/17  Yes [name], MD  furosemide (LASIX) 20 MG tablet Take 40mg  po in the morning and 20mg  in the evening Patient taking differently: Take 20-40 mg by mouth See admin instructions. Take 40mg  po in the morning and 20mg  in the evening 08/25/18  Yes Strader, [name] M, PA-C  gabapentin (NEURONTIN) 600 MG tablet Take 600 mg by mouth 4 (four) times daily.   Yes [provider]  Hydrocortisone (GERHARDT'S BUTT CREAM) CREA Apply 1 application topically 3 (three) times daily. 05/26/17  Yes [name], MD  insulin aspart (NOVOLOG) 100 UNIT/ML injection Inject 1-18 Units into the skin 3 (three) times daily with meals. Patient taking differently: Inject 1-20 Units into the skin 3 (three) times daily with meals.  04/09/18  Yes Johnson, Clanford L, MD  insulin detemir (LEVEMIR) 100 UNIT/ML injection Inject 0.4 mLs (40 Units total) into the skin at bedtime. 11/26/17  Yes Tat, Shanon Brow, MD  levothyroxine (SYNTHROID, LEVOTHROID) 200 MCG tablet Take 1 tablet by mouth daily. 06/24/18  Yes [provider]  nitroGLYCERIN (NITROSTAT) 0.4 MG SL tablet Place 0.4 mg under the tongue every 5 (five) minutes as needed for chest pain.  03/26/18  Yes [provider]   nystatin (MYCOSTATIN/NYSTOP) powder Apply topically 2 (two) times daily. 10/10/16  Yes Eber Jones, MD  Omega-3 Fatty Acids (FISH OIL PO) Take 1 capsule by mouth daily.    Yes [provider]  ondansetron (ZOFRAN) 8 MG tablet Take 8 mg by mouth every 8 (eight) hours as needed for nausea or vomiting.  08/11/18  Yes [provider]  oxyCODONE-acetaminophen (PERCOCET) 10-325 MG tablet Take 1 tablet by mouth every 6 (six) hours as needed for pain.   Yes [provider]  OXYGEN Inhale 2 L into the lungs continuous.   Yes [provider]  pantoprazole (PROTONIX) 40 MG tablet Take 1 tablet (40 mg total) by mouth 2 (two) times daily. 08/25/18  Yes Strader, [name] M, PA-C  potassium chloride (MICRO-K) 10 MEQ CR capsule Take 2 capsules (20 mEq total) by mouth 2 (two) times daily. *May take one additional tablet as needed for cramping 04/23/18  Yes Herminio Commons, MD  tiZANidine (ZANAFLEX) 4 MG tablet Take 1 tablet (4  mg total) by mouth every 8 (eight) hours as needed for muscle spasms. 07/08/18  Yes Kathie Dike, MD  topiramate (TOPAMAX) 50 MG tablet Take 150 mg by mouth every evening.  03/18/18  Yes [provider]  umeclidinium-vilanterol (ANORO ELLIPTA) 62.5-25 MCG/INH AEPB Inhale 1 puff into the lungs daily. Patient taking differently: Inhale 1 puff into the lungs daily as needed (for shortness of breath).  05/26/17  Yes [name], MD  SURE COMFORT INS SYR 1CC/28G 28G X 1/2" 1 ML MISC USE TO INJECT INSULIN UP TO 4 TIMES DAILY. 03/06/17   Cassandria Anger, MD    Inpatient Medications: Scheduled Meds:  aspirin EC  81 mg Oral Daily   atorvastatin  80 mg Oral q morning - 10a   cholecalciferol  5,000 Units Oral Daily   cyanocobalamin  1,000 mcg Intramuscular Daily   furosemide  40 mg Intravenous BID   gabapentin  300 mg Oral QID   Gerhardt's butt cream  1 application Topical TID   insulin aspart  0-15 Units Subcutaneous TID WC    insulin aspart  0-5 Units Subcutaneous QHS   insulin aspart  5 Units Subcutaneous TID WC   insulin detemir  20 Units Subcutaneous QHS   levothyroxine  200 mcg Oral QAC breakfast   nystatin   Topical BID   omega-3 acid ethyl esters  1 g Oral Daily   pantoprazole  40 mg Oral BID   polyethylene glycol  17 g Oral BID   potassium chloride  20 mEq Oral BID   senna  2 tablet Oral BID WC   sodium chloride flush  3 mL Intravenous Q12H   topiramate  150 mg Oral QPM   umeclidinium-vilanterol  1 puff Inhalation Daily   Continuous Infusions:  sodium chloride 50 mL/hr at 09/10/18 0253   sodium chloride 10 mL/hr at 09/08/18 0536    ceFAZolin (ANCEF) IV 1 g (09/10/18 0642)   PRN Meds: sodium chloride, acetaminophen **OR** acetaminophen, albuterol, ALPRAZolam, bisacodyl, nitroGLYCERIN, ondansetron, oxyCODONE-acetaminophen **AND** oxyCODONE, sodium chloride flush, tiZANidine  Allergies:    Allergies  Allergen Reactions   Dilaudid [Hydromorphone Hcl] Itching   Midodrine Hcl Swelling    After one dose had anaphylactic reaction, had to call EMS.   Actifed Cold-Allergy [Chlorpheniramine-Phenylephrine]     "I was sick and red and it didn't agree with me at all"   Doxycycline Nausea And Vomiting   Other Cough    Pt states she is allergic to ragweed and that she starts coughing and sneezing like crazy   Penicillins Hives and Itching    Tolerates Rocephin Has patient had a PCN reaction causing immediate rash, facial/tongue/throat swelling, SOB or lightheadedness with hypotension: Yes Has patient had a PCN reaction causing severe rash involving mucus membranes or skin necrosis: No Has patient had a PCN reaction that required hospitalization Yes Has patient had a PCN reaction occurring within the last 10 years: No If all of the above answers are "NO", then may proceed with Cephalosporin use.    Reglan [Metoclopramide] Itching   Valium [Diazepam] Itching   Vistaril [Hydroxyzine  Hcl] Itching    Social History:   Social History   Socioeconomic History   Marital status: Widowed    Spouse name: Not on file   Number of children: Not on file   Years of education: Not on file   Highest education level: Not on file  Occupational History    Comment: retired from [employer], worked Psychologist, sport and exercise  Social Needs  Financial resource strain: Not on file   Food insecurity:    Worry: Not on file    Inability: Not on file   Transportation needs:    Medical: Not on file    Non-medical: Not on file  Tobacco Use   Smoking status: Former Smoker    Packs/day: 0.25    Types: Cigarettes    Last attempt to quit: 11/24/2014    Years since quitting: 3.7   Smokeless tobacco: Never Used  Substance and Sexual Activity   Alcohol use: No    Alcohol/week: 0.0 standard drinks   Drug use: No   Sexual activity: Not Currently    Birth control/protection: Surgical  Lifestyle   Physical activity:    Days per week: Not on file    Minutes per session: Not on file   Stress: Not on file  Relationships   Social connections:    Talks on phone: Not on file    Gets together: Not on file    Attends religious service: Not on file    Active member of club or organization: Not on file    Attends meetings of clubs or organizations: Not on file    Relationship status: Not on file   Intimate partner violence:    Fear of current or ex partner: Not on file    Emotionally abused: Not on file    Physically abused: Not on file    Forced sexual activity: Not on file  Other Topics Concern   Not on file  Social History Narrative   Not on file     Family History:    Family History  Problem Relation Age of Onset   Stroke Mother        deceased 18    Heart attack Father        deceased 86    Diabetes Brother    Cerebral palsy Brother    Pneumonia Brother    Diabetes Other    Heart attack Other    Colon cancer Neg Hx       Review of Systems    General:   No chills, fever, night sweats or weight changes.  Cardiovascular:  No chest pain, orthopnea, palpitations, paroxysmal nocturnal dyspnea. Positive for dyspnea on exertion and edema.  Dermatological: No rash, lesions/masses Respiratory: No cough, dyspnea Urologic: No hematuria, dysuria Abdominal:   No nausea, vomiting, diarrhea, bright red blood per rectum, melena, or hematemesis Neurologic:  No visual changes, wkns, changes in mental status. All other systems reviewed and are otherwise negative except as noted above.  Physical Exam/Data    Vitals:   09/09/18 2305 09/10/18 0552 09/10/18 0600 09/10/18 0727  BP: (!) 117/57 (!) 122/56    Pulse: 81 86    Resp: 17 16    Temp: 98.7 F (37.1 C) 99.1 F (37.3 C)    TempSrc: Oral Oral    SpO2: 100% 100%  95%  Weight:   (!) 140.3 kg   Height:        Intake/Output Summary (Last 24 hours) at 09/10/2018 0902 Last data filed at 09/10/2018 0600 Gross per 24 hour  Intake 1320 ml  Output 1700 ml  Net -380 ml   Filed Weights   09/07/18 0646 09/08/18 0540 09/10/18 0600  Weight: (!) 137 kg (!) 139.1 kg (!) 140.3 kg   Body mass index is 48.44 kg/m.   General: Pleasant, obese Caucasian female appearing in NAD Psych: Normal affect. Neuro: Alert and oriented X 3. Moves all  extremities spontaneously. HEENT: Normal  Neck: Supple without bruits or JVD. Lungs:  Resp regular and unlabored, CTA without wheezing or rales. Heart: RRR no s3, s4, or murmurs. Abdomen: Obese, non-tender, non-distended, BS + x 4.  Extremities: Chronic lymphedema with mild erythema lower legs. DP/PT/Radials 2+ and equal bilaterally.   Labs/Studies     Relevant CV Studies:  Cardiac Catheterization: 05/2017  Ost LAD lesion is 65% stenosed.  Prox LAD-1 lesion is 70% stenosed with 80% stenosed side branch in Ost 1st Diag.  Prox LAD-2 lesion is 60% stenosed.  LV end diastolic pressure is mildly elevated.  There is no aortic valve stenosis.  Hemodynamic findings  consistent with mild pulmonary hypertension.   Patient has essentially severe single/2-vessel disease involving the LAD major diagonal branch with otherwise normal circumflex, ramus and RCA with PDA/PLA.  There is ostial LAD as well as bifurcation LAD having diagonal.  By rights, the patient's best option would be bypass surgery, however I have been told by her primary team that she probably is not a good bypass candidate.  I will therefore discuss the findings with my interventional colleagues to determine if there is an approachable option for bifurcation PCI.  The ostial LAD may be not approachable.  Plan: She will be transferred to [facility] 6 E./stepdown unit. We will start IV heparin 6-8 hours post sheath removal/TR band removal. Continue symptomatic treatment with anginal agents.  Will determine course of action after discussion with interventional colleagues.  Coronary Stent Intervention: 05/2017  Ost LAD lesion is 55% stenosed.  Prox LAD lesion is 95% stenosed with 80% stenosed side branch in Ost 1st Diag.  A drug-eluting stent was successfully placed using a STENT SYNERGY DES 2.5X20.  Post intervention, there is a 0% residual stenosis.  Post intervention, the side branch was reduced to 20% residual stenosis.   1. Successful PCI with stenting of the LAD with a DES and balloon angioplasty of the diagonal branch   Plan: continue DAPT for at least one year. Anticipate DC when medically stable.     Echocardiogram: 07/05/2018 IMPRESSIONS  1. The left ventricle has normal systolic function with an ejection fraction of 60-65%. The cavity size was normal. There is moderately increased left ventricular wall thickness. Left ventricular diastolic Doppler parameters are consistent with impaired  relaxation. No evidence of left ventricular regional wall motion abnormalities.  2. The right ventricle has normal systolic function. The cavity was normal. There is no increase  in right ventricular wall thickness. Right ventricular systolic pressure is moderately elevated with an estimated pressure of 46.8 mmHg.  3. The mitral valve is normal in structure. There is mild mitral annular calcification present.  4. The tricuspid valve is normal in structure.  5. The aortic valve was not well visualized.  6. No evidence of left ventricular regional wall motion abnormalities.  Laboratory Data:  Chemistry Recent Labs  Lab 09/07/18 0557 09/08/18 0613 09/09/18 0425  NA 138 135 138  K 4.5 4.7 4.1  CL 103 98 100  CO2 28 29 28   GLUCOSE 174* 224* 143*  BUN 50* 49* 43*  CREATININE 1.29* 1.28* 1.18*  CALCIUM 8.4* 8.8* 8.8*  GFRNONAA 43* 43* 47*  GFRAA 49* 50* 55*  ANIONGAP 7 8 10     Recent Labs  Lab 09/05/18 0547 09/06/18 0611 09/09/18 0425  ALBUMIN 3.3* 2.8* 3.3*   Hematology Recent Labs  Lab 09/07/18 0557 09/08/18 0613 09/09/18 0425  WBC 7.1 8.7 8.2  RBC 3.14* 3.42* 3.42*  HGB 7.8* 8.5* 8.6*  HCT 27.1* 29.8* 29.8*  MCV 86.3 87.1 87.1  MCH 24.8* 24.9* 25.1*  MCHC 28.8* 28.5* 28.9*  RDW 19.8* 19.8* 19.9*  PLT 284 318 303   Cardiac EnzymesNo results for input(s): TROPONINI in the last 168 hours. No results for input(s): TROPIPOC in the last 168 hours.  BNPNo results for input(s): BNP, PROBNP in the last 168 hours.  DDimer No results for input(s): DDIMER in the last 168 hours.  Radiology/Studies:  US Venous Img Lower Bilateral  Result Date: 09/08/2018 CLINICAL DATA:  Lower extremity pain EXAM: BILATERAL LOWER EXTREMITY VENOUS DUPLEX ULTRASOUND TECHNIQUE: Doppler venous assessment of the bilateral lower extremity deep venous system was performed, including characterization of spectral flow, compressibility, and phasicity. COMPARISON:  None. FINDINGS: There is complete compressibility of the bilateral common femoral, femoral, and popliteal veins. Doppler analysis demonstrates respiratory phasicity and augmentation of flow with calf compression. No  obvious superficial vein or calf vein thrombosis. IMPRESSION: No evidence of lower extremity DVT. Electronically Signed   By: Marybelle Killings M.D.   On: 09/08/2018 13:27     Assessment & Plan    1. Preoperative Cardiac Clearance for Hemicolectomy - the patient initially presented with a CHF exacerbation but was found to have a severe anemia with heme-positive stools and colonoscopy confirming a mass along the hepatic flexure.  - she has a history of chronic chest wall pain which makes assessment of ischemic symptoms more challenging. Her last PCI was in 05/2017 and she is no longer on DAPT. Most recent echo in 06/2018 showed a preserved EF of 60-65% with no regional WMA.  - her RCRI Risk is overall elevated at 11% risk of a major cardiac event. Will review with Dr. Domenic Polite in regards to further ischemic evaluation prior to surgery (ie. repeat stress testing). However, would not anticipate this unless truly unable to distinguish her chronic pain symptoms from her prior angina. Will repeat an EKG given the artifact on her initial tracing at the time of admission.   2. Acute on Chronic Diastolic CHF Exacerbation - echo in 06/2018 showed a preserved EF of 60-65% with no regional WMA. BNP elevated to 254 on admission. She has been receiving 40mg  BID with a recorded output of -13.6L this admission yet weights have overall been stable (at 307 lbs on admission, at 309 lbs today?).  - continue with IV diuresis at current rate. Continue to follow I&O's and daily weights. She was taking Lasix 40mg  in AM/20mg  in PM PTA. Suspect she will require at least 40mg  BID at the time of discharge with close follow-up of her renal function.   3. CAD - s/p DES to LADand angioplasty to Stephanie Combs 01/2019as outlined above. No longer on Plavix and BB therapy previously discontinued due to significant bradycardia. Continue ASA and statin therapy.   4. HLD - followed by PCP. LDL at 37 when checked in 06/2018. - continue  Atorvastatin 80mg  daily.   5. Stage 3 CKD - creatinine elevated to 1.58 on admission. Has improved with IV diuresis and now at 1.18.  6. Hypothyroidism - TSH previously elevated to 199 in 10/2017 when she was having syncopal episodes. Now improved to 0.813. Remains on Synthroid.   7. Anemia - Hgb stable at 8.6 when checked on 4/30. Recent EGD and colonoscopy as outlined above with new colonic mass. GI following.    For questions or updates, please contact [place] Please consult [email or web address] for contact info under Cardiology/STEMI.  Signed, Fransisco Hertz  Strader, PA-C 09/10/2018, 9:02 AM Pager: [phone]   Attending note:  Patient seen and examined.  I reviewed extensive records and agree with above documentation by Ms. Strader PA-C and made modifications to the physical exam section.  Ms. Belzer is being evaluated regarding preoperative cardiac assessment prior to planned hemicolectomy under general anesthesia.  She has been hospitalized with recent acute on chronic diastolic heart failure lower leg cellulitis in the setting of chronic lymphedema and fluid overload, also severe anemia ultimately with findings of an exophytic bulky similar mass at the hepatic flexure and distal ascending colon.  She has diuresed substantially during hospital stay, nearly 14 L.  Her most recent echocardiogram from February showed LVEF 60 to 65% with grade 1 diastolic dysfunction, moderate pulmonary hypertension.  She does have recurring and rather chronic chest discomfort, although none reported today.  Cardiac history and ischemic evaluations are detailed above.  On examination this morning she is in no distress.  Afebrile, systolic blood pressure 670-141 range with heart rate in the 80s, likely sinus rhythm with frequent PACs, follow-up ECG pending.  Shows potassium 4.1, BUN 43, creatinine 1.18, hemoglobin 8.6, platelets 303.  Chest x-ray from April 20 as reported mild left lower lobe atelectasis  versus infiltrate with small associated effusion.  RCRI cardiac risk calculator is in the high range at 11% chance of major adverse cardiac events.  This is in the absence of any additional cardiac ischemic testing.  Given the circumstances and anticipated surgery which ideally should not be deferred for any significant period of time, would continue with medical therapy and hold off on further ischemic evaluation.  Stress testing would be unlikely to change her current preoperative risk assessment, and a cardiac catheterization would ultimately not be pursued unless she was having unstable cardiac symptoms as placement of a stent would require uninterrupted dual antiplatelet therapy for a period of 6 months.  She is currently on low-dose aspirin and Lipitor along with IV Lasix supplements.  Follow-up ECG is pending.  Our cardiology service can follow her as the need arises at [facility].  Satira Sark, M.D., F.A.C.C.

## 2018-09-10 NOTE — Clinical Social Work Note (Addendum)
LCSW contacted PCP office in an attempt to reschedule however office was closed. Patient is transferring to [facility] for hemicolectomy.     Stephanie Combs, Clydene Pugh, LCSW

## 2018-09-10 NOTE — Progress Notes (Signed)
Pathology report remains pending this afternoon.  Agree with transfer to [name].

## 2018-09-11 ENCOUNTER — Inpatient Hospital Stay (HOSPITAL_COMMUNITY): Payer: Medicare Other

## 2018-09-11 ENCOUNTER — Encounter (HOSPITAL_COMMUNITY): Payer: Self-pay | Admitting: Radiology

## 2018-09-11 DIAGNOSIS — I2510 Atherosclerotic heart disease of native coronary artery without angina pectoris: Secondary | ICD-10-CM

## 2018-09-11 LAB — BASIC METABOLIC PANEL
Anion gap: 10 (ref 5–15)
BUN: 31 mg/dL — ABNORMAL HIGH (ref 8–23)
CO2: 27 mmol/L (ref 22–32)
Calcium: 8.5 mg/dL — ABNORMAL LOW (ref 8.9–10.3)
Chloride: 96 mmol/L — ABNORMAL LOW (ref 98–111)
Creatinine, Ser: 1.38 mg/dL — ABNORMAL HIGH (ref 0.44–1.00)
GFR calc Af Amer: 45 mL/min — ABNORMAL LOW (ref >60)
GFR calc non Af Amer: 39 mL/min — ABNORMAL LOW (ref >60)
Glucose, Bld: 219 mg/dL — ABNORMAL HIGH (ref 70–99)
Potassium: 4.4 mmol/L (ref 3.5–5.1)
Sodium: 133 mmol/L — ABNORMAL LOW (ref 135–145)

## 2018-09-11 LAB — GLUCOSE, CAPILLARY
Glucose-Capillary: 161 mg/dL — ABNORMAL HIGH (ref 70–99)
Glucose-Capillary: 182 mg/dL — ABNORMAL HIGH (ref 70–99)
Glucose-Capillary: 145 mg/dL — ABNORMAL HIGH (ref 70–99)
Glucose-Capillary: 95 mg/dL (ref 70–99)

## 2018-09-11 LAB — CEA: CEA: 2.1 ng/mL (ref 0.0–4.7)

## 2018-09-11 MED ORDER — CEPHALEXIN 250 MG PO CAPS
500.0000 mg | ORAL_CAPSULE | Freq: Four times a day (QID) | ORAL | Status: DC
  Administered 2018-09-11 – 2018-09-12 (×5): 500 mg via ORAL
  Filled 2018-09-11 (×5): qty 2

## 2018-09-11 MED ORDER — IOHEXOL 300 MG/ML  SOLN
100.0000 mL | Freq: Once | INTRAMUSCULAR | Status: AC | PRN
  Administered 2018-09-11: 100 mL via INTRAVENOUS

## 2018-09-11 NOTE — Progress Notes (Signed)
Physical Therapy Treatment Patient Details Name: Stephanie Combs MRN: [id number] DOB: 1950-08-29 Today's Date: 09/11/2018    History of Present Illness 68 y.o. female, w CAD s/p DES prox LAD, CHF (EF 60-65%), Dm2, CKD stage3, Hypertension, Hypothyroidism, Chronic lymphedema, with c/o dyspnea for the past 5 days, along with increase in lower ext edema and R LQ pain.  Hx of diverticulosis. Colonoscopy reveals R colon mass. Transferred to [facility] for surgery 5/4.    PT Comments    Pt reluctant to work with therapy as she "just got comfortable.", with encouragement pt agrees to limited ambulation in room. Pt is limited in safe mobility by back and shoulder pain, as well as decreased strength and endurance with mobility. Pt is supervision to come to EoB and for transfers from bed and BSC. Pt is min guard for ambulation of 20 feet and min A for management of LE to return to bed. D/c plan is currently appropriate, however pt will need to be reassessed s/p surgery. PT will continue to follow acutely.   Follow Up Recommendations  Home health PT;Supervision for mobility/OOB;Supervision - Intermittent     Equipment Recommendations  None recommended by PT       Precautions / Restrictions Precautions Precautions: Fall Restrictions Weight Bearing Restrictions: No    Mobility  Bed Mobility Overal bed mobility: Needs Assistance Bed Mobility: Supine to Sit;Sit to Supine     Supine to sit: Supervision Sit to supine: Min assist   General bed mobility comments: supervsion for coming to EoB, requiring increased time and effort. minA for lifting R LE back into bed  Transfers Overall transfer level: Needs assistance Equipment used: Rolling walker (2 wheeled) Transfers: Sit to/from Stand Sit to Stand: Supervision         General transfer comment: supervision for safety, increased time and effort to come to standing and steady self  Ambulation/Gait Ambulation/Gait assistance: Min guard Gait  Distance (Feet): 20 Feet Assistive device: Rolling walker (2 wheeled) Gait Pattern/deviations: Decreased step length - right;Decreased step length - left;Decreased stride length Gait velocity: slowed Gait velocity interpretation: <1.31 ft/sec, indicative of household ambulator General Gait Details: slow waddling steps with wide Bos due to pannus, min guard for safety        Balance Overall balance assessment: Needs assistance Sitting-balance support: Feet supported;No upper extremity supported Sitting balance-Leahy Scale: Good     Standing balance support: During functional activity;Single extremity supported Standing balance-Leahy Scale: Poor Standing balance comment: requires single UE support for dynamic balance                            Cognition Arousal/Alertness: Awake/alert Behavior During Therapy: WFL for tasks assessed/performed Overall Cognitive Status: Within Functional Limits for tasks assessed                                           General Comments General comments (skin integrity, edema, etc.): Pt on 2L O2 via  with SAO2 >90%O2 throughout session.       Pertinent Vitals/Pain Pain Assessment: Faces Faces Pain Scale: Hurts little more Pain Location: Rt shoulder and BLE Pain Descriptors / Indicators: Discomfort;Pressure Pain Intervention(s): Limited activity within patient's tolerance;Monitored during session;Repositioned;Heat applied           PT Goals (current goals can now be found in the care plan section) Acute Rehab  PT Goals PT Goal Formulation: With patient Time For Goal Achievement: 09/25/18 Potential to Achieve Goals: Fair Progress towards PT goals: Progressing toward goals    Frequency    Min 3X/week      PT Plan Current plan remains appropriate    Co-evaluation              AM-PAC PT "6 Clicks" Mobility   Outcome Measure  Help needed turning from your back to your side while in a flat bed  without using bedrails?: None Help needed moving from lying on your back to sitting on the side of a flat bed without using bedrails?: None Help needed moving to and from a bed to a chair (including a wheelchair)?: A Little Help needed standing up from a chair using your arms (e.g., wheelchair or bedside chair)?: A Little Help needed to walk in hospital room?: A Little Help needed climbing 3-5 steps with a railing? : A Lot 6 Click Score: 19    End of Session Equipment Utilized During Treatment: Oxygen Activity Tolerance: Patient tolerated treatment well;Patient limited by fatigue Patient left: in bed;with call bell/phone within reach Nurse Communication: Mobility status PT Visit Diagnosis: Unsteadiness on feet (R26.81);Other abnormalities of gait and mobility (R26.89);Muscle weakness (generalized) (M62.81)     Time: 1330-1350 PT Time Calculation (min) (ACUTE ONLY): 20 min  Charges:  $Gait Training: 8-22 mins                     Caisen Mangas B. Migdalia Dk PT, DPT Acute Rehabilitation Services Pager [phone] Office [phone]    [place] 09/11/2018, 2:51 PM

## 2018-09-11 NOTE — Progress Notes (Signed)
2 Days Post-Op  Subjective: CC: Abdominal pain Patient reports that she has some LLQ abdominal pain and nausea this AM. Both she reports are mild and come and go. She has been having soft/watery BM's daily since colonoscopy of 4/30. Tolerating HH diet. No emesis.   Objective: Vital signs in last 24 hours: Temp:  [97.5 F (36.4 C)-98.9 F (37.2 C)] 97.5 F (36.4 C) (05/02 0654) Pulse Rate:  [65-84] 80 (05/02 0654) Resp:  [16-22] 18 (05/02 0654) BP: (107-132)/(37-85) 107/85 (05/02 0654) SpO2:  [100 %] 100 % (05/02 0730) Weight:  [137.1 kg-137.5 kg] 137.1 kg (05/02 0706) Last BM Date: 09/09/18  Intake/Output from previous day: 05/01 0701 - 05/02 0700 In: 240 [P.O.:240] Out: 800 [Urine:800] Intake/Output this shift: Total I/O In: -  Out: 700 [Urine:700]  PE: Gen: Awake and alert, anxious Heart: Regular Lungs: Distant breath sounds. On 2L o2. Cannot talk in complete sentences without pausing 2/2 SOB Abd: Obese, soft, ND, minimal tenderness LLQ without r/r/g. +BS. Prior umbilical surgical scar.  Msk: B/l LE edema   Lab Results:  Recent Labs    09/09/18 0425  WBC 8.2  HGB 8.6*  HCT 29.8*  PLT 303   BMET Recent Labs    09/09/18 0425  NA 138  K 4.1  CL 100  CO2 28  GLUCOSE 143*  BUN 43*  CREATININE 1.18*  CALCIUM 8.8*   PT/INR No results for input(s): LABPROT, INR in the last 72 hours. CMP     Component Value Date/Time   NA 138 09/09/2018 0425   K 4.1 09/09/2018 0425   CL 100 09/09/2018 0425   CO2 28 09/09/2018 0425   GLUCOSE 143 (H) 09/09/2018 0425   BUN 43 (H) 09/09/2018 0425   BUN 28 (A) 11/11/2016   CREATININE 1.18 (H) 09/09/2018 0425   CALCIUM 8.8 (L) 09/09/2018 0425   PROT 5.8 (L) 09/02/2018 0500   ALBUMIN 3.3 (L) 09/09/2018 0425   AST 12 (L) 09/02/2018 0500   ALT 10 09/02/2018 0500   ALKPHOS 97 09/02/2018 0500   BILITOT 0.6 09/02/2018 0500   GFRNONAA 47 (L) 09/09/2018 0425   GFRAA 55 (L) 09/09/2018 0425   Lipase     Component Value  Date/Time   LIPASE 21 04/05/2018 1629       Studies/Results: No results found.  Anti-infectives: Anti-infectives (From admission, onward)   Start     Dose/Rate Route Frequency Ordered Stop   09/07/18 1615  ceFAZolin (ANCEF) IVPB 1 g/50 mL premix     1 g 100 mL/hr over 30 Minutes Intravenous Every 8 hours 09/07/18 1605     09/05/18 1715  cefTRIAXone (ROCEPHIN) 2 g in sodium chloride 0.9 % 100 mL IVPB  Status:  Discontinued     2 g 200 mL/hr over 30 Minutes Intravenous Every 24 hours 09/05/18 1705 09/06/18 1757   09/05/18 1645  cefTRIAXone (ROCEPHIN) 1 g in sodium chloride 0.9 % 100 mL IVPB  Status:  Discontinued     1 g 200 mL/hr over 30 Minutes Intravenous Every 24 hours 09/05/18 1634 09/05/18 1705       Assessment/Plan COPD on 2L home O2 DM CHF admitted for fluid overload CAD s/p stent 1/19 HTN Lymphedema  Anemia   Right Colon Mass - Colonoscopy at [facility] on 4/30 with colon mass near the hepatic flexure, which was biopsied and tattooed.  - Pathology pending - CEA 2.1 - Will obtain CT [facility] to evaluate abdomen further  - Poor pulmonary function that  would need to be optimized prior to a surgery. Consider transfusing to get Hgb closer to 10 before a surgery  - Cardiology note at [facility] from 5/1 places patient at 11% chance of major adverse cardiac events. Appreciate cardiology following and their input.   FEN - CLD VTE - SCD, okay for chemical prophylaxis from a surgical standpoint ID - Ancef 4/28 >> WBC 8.2 on 4/30   LOS: 11 days    Jillyn Ledger , [facility] 09/11/2018, 10:13 AM Pager: [phone]

## 2018-09-11 NOTE — Progress Notes (Signed)
PROGRESS NOTE    Stephanie Combs  WCH:[id number] DOB: 10/14/1950 DOA: 08/30/2018 PCP: [name], MD    Brief Narrative:  68 year old female who presented with dyspnea and lower extremity edema.  Does have significant past medical history for coronary artery disease, diastolic heart failure, type 2 diabetes mellitus, chronic kidney disease stage III, hypertension, hypothyroidism and chronic lymphedema.  She reported 5 days of worsening dyspnea along with lower extremity edema and dry cough.  Symptoms were refractory to increased dose of oral furosemide.  On her initial physical examination she was afebrile, pulse rate 84, respirate 24, blood pressure 130/100, oxygenation 100% on supplemental oxygen.  Her lungs are clear to auscultation bilaterally, heart S1-S2 present, rhythmic, no gallops, rubs or murmurs, the abdomen was protuberant, nontender, no lower extremity edema.  Sodium 138, potassium 4.2, chloride 102, bicarb 27, glucose 241, BUN 23, creatinine 1.58, AST 13, ALT 11, iron 9, TIBC 431, transferrin saturation 2, ferritin 4, white count 7.7, hemoglobin 7.0, hematocrit 35.0, platelets 377.  Patient was admitted to the hospital with a working diagnosis of symptomatic anemia and decompensated heart failure.  Patient received 2 units packed red blood cells with improvement of her hemoglobin and hematocrit.  Patient was successfully diuresed with IV furosemide, with improvement of her symptoms.  Further work-up with colonoscopy April 30 showed a colonic mass.  Transferred from [facility] to to [facility] for surgical evaluation.   Assessment & Plan:   Principal Problem:   Anemia Active Problems:   DM type 2 (diabetes mellitus, type 2) (HCC)   HTN (hypertension), benign   CKD (chronic kidney disease), stage III (HCC)   Hypothyroidism   Bilateral lower extremity edema   Uncontrolled type 2 diabetes mellitus with stage 3 chronic kidney disease (HCC)   Obesity, Class III, BMI 40-49.9  (morbid obesity) (HCC)   CAD (coronary artery disease)   Symptomatic anemia   Acute on chronic diastolic CHF (congestive heart failure) (HCC)   Heme positive stool   Non-intractable vomiting   Abdominal pain, epigastric   Pain of lower extremity   Colonic mass   Colon polyps   Cellulitis of leg, left   1. Symptomatic iron deficiency anemia due to colonic mass/ near the hepatic flexure. Her Hgb and Hct have been stable, at 8,6 and 29.8. No signs of active bleeding. Severe iron deficiency with serum iron at 9.0, TIBC at 413, ferritin 4 and transferrin saturation of 2. Patient has received 2 units prbc transfusion and IV iron infusion. Plan for surgical intervention next week. She had an elevated peri-operative risk due to comorbid conditions. Her RCI is class 3 with risk up to 10%. No pre-operative cardiac workup recommended at this point. Continue aspirin and statin.     2. Decompensated diastolic heart failure, acute on chronic.  EF 60 to 65% with moderately increased LV wall thickness.Patient with mild JVD, positive lymphedema. Her fluid balance is negative 14,889 ml. Blood pressure 114/64. Continue diuresis with furosemide 40 mg IV bid.   3. Left leg cellulitis. Patient continue to have pain and now mild erythema of the left lateral leg. Has been on cefazolin #3 days. Will change to oral cephalexin. Continue rash monitoring, patient with lymphedema and risk for local infections.   4. Morbid obesity and dyslipidemia. Continue atorvastatin. Calculated BMI is 48.7.   5. T2DM. Fasting glucose 143. Will continue glucose cover and monitoring with insulin sliding scale. Basal insulin with 20 units daily and 5 units pre-meal. Patient is tolerating po  well.   6. Hypothyroid. Continue levothyroxine.   7. AKI on CKD stage 3. Her peak cr at 1,40, last on 04/30 was 1,18 with K at 4,1 and serum bicarbonate at 28. Will hold on further K supplementation and will follow on renal panel today and in am.  Avoid hypotension and nephrotoxic medications.    DVT prophylaxis: heparin   Code Status:  full Family Communication: no family at the bedside  Disposition Plan/ discharge barriers: pending surgical intervention.   Body mass index is 48.78 kg/m. Malnutrition Type:      Malnutrition Characteristics:      Nutrition Interventions:     RN Pressure Injury Documentation:     Consultants:   Surgery   Procedures:     Antimicrobials:   Cefazolin 04/28 to 05/02  Cephalelxin 05/02   Subjective: Patient continue to have dyspnea and left leg pain, no nausea or vomiting. No dyspnea but mild chest discomfort.   Objective: Vitals:   09/10/18 2056 09/11/18 0654 09/11/18 0706 09/11/18 0730  BP: 132/60 107/85    Pulse: 65 80    Resp: 18 18    Temp: 98.6 F (37 C) (!) 97.5 F (36.4 C)    TempSrc: Oral Oral    SpO2: 100% 100%  100%  Weight:   (!) 137.1 kg   Height:        Intake/Output Summary (Last 24 hours) at 09/11/2018 0950 Last data filed at 09/11/2018 6122 Gross per 24 hour  Intake 240 ml  Output 1500 ml  Net -1260 ml   Filed Weights   09/10/18 0600 09/10/18 1716 09/11/18 0706  Weight: (!) 140.3 kg (!) 137.5 kg (!) 137.1 kg    Examination:   General: deconditioned  Neurology: Awake and alert, non focal  E ENT: mild pallor, no icterus, oral mucosa moist Cardiovascular: No JVD. S1-S2 present, rhythmic, no gallops, rubs, or murmurs. +++ non pitting lower extremity edema. Pulmonary:  positive breath sounds bilaterally, decreased air movement, no wheezing, rhonchi or rales. Gastrointestinal. Abdomen protuberant with no organomegaly, non tender, no rebound or guarding Skin. Faint erythema at the lateral left leg, tender to palpation, ill defined borders, no skin rupture.  Musculoskeletal: no joint deformities     Data Reviewed: I have personally reviewed following labs and imaging studies  CBC: Recent Labs  Lab 09/05/18 0547 09/07/18 0557 09/08/18  0613 09/09/18 0425  WBC 8.4 7.1 8.7 8.2  HGB 8.2* 7.8* 8.5* 8.6*  HCT 28.9* 27.1* 29.8* 29.8*  MCV 85.8 86.3 87.1 87.1  PLT 343 284 318 449   Basic Metabolic Panel: Recent Labs  Lab 09/05/18 0547 09/06/18 0611 09/07/18 0557 09/08/18 0613 09/09/18 0425  NA 137 137 138 135 138  K 4.3 4.6 4.5 4.7 4.1  CL 102 101 103 98 100  CO2 28 28 28 29 28   GLUCOSE 140* 243* 174* 224* 143*  BUN 40* 45* 50* 49* 43*  CREATININE 1.26* 1.40* 1.29* 1.28* 1.18*  CALCIUM 8.7* 8.6* 8.4* 8.8* 8.8*  PHOS 3.7 4.7*  --   --  4.1   GFR: Estimated Creatinine Clearance: 65.1 mL/min (A) (by C-G formula based on SCr of 1.18 mg/dL (H)). Liver Function Tests: Recent Labs  Lab 09/05/18 0547 09/06/18 0611 09/09/18 0425  ALBUMIN 3.3* 2.8* 3.3*   No results for input(s): LIPASE, AMYLASE in the last 168 hours. No results for input(s): AMMONIA in the last 168 hours. Coagulation Profile: No results for input(s): INR, PROTIME in the last 168 hours. Cardiac Enzymes:  No results for input(s): CKTOTAL, CKMB, CKMBINDEX, TROPONINI in the last 168 hours. BNP (last 3 results) No results for input(s): PROBNP in the last 8760 hours. HbA1C: No results for input(s): HGBA1C in the last 72 hours. CBG: Recent Labs  Lab 09/10/18 0722 09/10/18 1140 09/10/18 1804 09/10/18 2158 09/11/18 0654  GLUCAP 158* 196* 215* 283* 161*   Lipid Profile: No results for input(s): CHOL, HDL, LDLCALC, TRIG, CHOLHDL, LDLDIRECT in the last 72 hours. Thyroid Function Tests: No results for input(s): TSH, T4TOTAL, FREET4, T3FREE, THYROIDAB in the last 72 hours. Anemia Panel: No results for input(s): VITAMINB12, FOLATE, FERRITIN, TIBC, IRON, RETICCTPCT in the last 72 hours.    Radiology Studies: I have reviewed all of the imaging during this hospital visit personally     Scheduled Meds: . aspirin EC  81 mg Oral Daily  . atorvastatin  80 mg Oral q morning - 10a  . cholecalciferol  5,000 Units Oral Daily  . furosemide  40 mg  Intravenous BID  . gabapentin  300 mg Oral QID  . Gerhardt's butt cream  1 application Topical TID  . insulin aspart  0-15 Units Subcutaneous TID WC  . insulin aspart  0-5 Units Subcutaneous QHS  . insulin aspart  5 Units Subcutaneous TID WC  . insulin detemir  20 Units Subcutaneous QHS  . levothyroxine  200 mcg Oral QAC breakfast  . nystatin   Topical BID  . omega-3 acid ethyl esters  1 g Oral Daily  . pantoprazole  40 mg Oral BID  . polyethylene glycol  17 g Oral BID  . potassium chloride  20 mEq Oral BID  . senna  2 tablet Oral BID WC  . sodium chloride flush  3 mL Intravenous Q12H  . topiramate  150 mg Oral QPM  . umeclidinium-vilanterol  1 puff Inhalation Daily   Continuous Infusions: . sodium chloride 250 mL (09/10/18 2306)  .  ceFAZolin (ANCEF) IV 1 g (09/11/18 0540)     LOS: 11 days        Mauricio Gerome Apley, MD

## 2018-09-12 LAB — CBC
HCT: 26.9 % — ABNORMAL LOW (ref 36.0–46.0)
Hemoglobin: 7.7 g/dL — ABNORMAL LOW (ref 12.0–15.0)
MCH: 24.8 pg — ABNORMAL LOW (ref 26.0–34.0)
MCHC: 28.6 g/dL — ABNORMAL LOW (ref 30.0–36.0)
MCV: 86.8 fL (ref 80.0–100.0)
Platelets: 267 10*3/uL (ref 150–400)
RBC: 3.1 MIL/uL — ABNORMAL LOW (ref 3.87–5.11)
RDW: 20.9 % — ABNORMAL HIGH (ref 11.5–15.5)
WBC: 8 10*3/uL (ref 4.0–10.5)
nRBC: 0 % (ref 0.0–0.2)

## 2018-09-12 LAB — GLUCOSE, CAPILLARY
Glucose-Capillary: 128 mg/dL — ABNORMAL HIGH (ref 70–99)
Glucose-Capillary: 117 mg/dL — ABNORMAL HIGH (ref 70–99)
Glucose-Capillary: 182 mg/dL — ABNORMAL HIGH (ref 70–99)
Glucose-Capillary: 188 mg/dL — ABNORMAL HIGH (ref 70–99)

## 2018-09-12 LAB — BASIC METABOLIC PANEL
Anion gap: 7 (ref 5–15)
BUN: 29 mg/dL — ABNORMAL HIGH (ref 8–23)
CO2: 30 mmol/L (ref 22–32)
Calcium: 8.6 mg/dL — ABNORMAL LOW (ref 8.9–10.3)
Chloride: 98 mmol/L (ref 98–111)
Creatinine, Ser: 1.29 mg/dL — ABNORMAL HIGH (ref 0.44–1.00)
GFR calc Af Amer: 49 mL/min — ABNORMAL LOW (ref >60)
GFR calc non Af Amer: 43 mL/min — ABNORMAL LOW (ref >60)
Glucose, Bld: 130 mg/dL — ABNORMAL HIGH (ref 70–99)
Potassium: 4.3 mmol/L (ref 3.5–5.1)
Sodium: 135 mmol/L (ref 135–145)

## 2018-09-12 MED ORDER — FUROSEMIDE 40 MG PO TABS
40.0000 mg | ORAL_TABLET | Freq: Two times a day (BID) | ORAL | Status: DC
  Administered 2018-09-12 – 2018-09-13 (×2): 40 mg via ORAL
  Filled 2018-09-12 (×2): qty 1

## 2018-09-12 MED ORDER — FUROSEMIDE 40 MG PO TABS: 40.0000 mg | ORAL_TABLET | Freq: Every day | ORAL | Status: DC

## 2018-09-12 NOTE — Plan of Care (Signed)
  Problem: Education: Goal: Knowledge of General Education information will improve Description Including pain rating scale, medication(s)/side effects and non-pharmacologic comfort measures Outcome: Progressing   Problem: Health Behavior/Discharge Planning: Goal: Ability to manage health-related needs will improve Outcome: Progressing   

## 2018-09-12 NOTE — Progress Notes (Signed)
PROGRESS NOTE    Stephanie Combs  SWN:[id number] DOB: [birth year] DOA: 08/30/2018 PCP: [name], MD    Brief Narrative:  68 year old female who presented with dyspnea and lower extremity edema.  Does have significant past medical history for coronary artery disease, diastolic heart failure, type 2 diabetes mellitus, chronic kidney disease stage III, hypertension, hypothyroidism and chronic lymphedema.  She reported 5 days of worsening dyspnea along with lower extremity edema and dry cough.  Symptoms were refractory to increased dose of oral furosemide.  On her initial physical examination she was afebrile, pulse rate 84, respirate 24, blood pressure 130/100, oxygenation 100% on supplemental oxygen.  Her lungs are clear to auscultation bilaterally, heart S1-S2 present, rhythmic, no gallops, rubs or murmurs, the abdomen was protuberant, nontender, no lower extremity edema.  Sodium 138, potassium 4.2, chloride 102, bicarb 27, glucose 241, BUN 23, creatinine 1.58, AST 13, ALT 11, iron 9, TIBC 431, transferrin saturation 2, ferritin 4, white count 7.7, hemoglobin 7.0, hematocrit 35.0, platelets 377.  Patient was admitted to the hospital with a working diagnosis of symptomatic anemia and decompensated heart failure.  Patient received 2 units packed red blood cells with improvement of her hemoglobin and hematocrit.  Patient was successfully diuresed with IV furosemide, with improvement of her symptoms.  Further work-up with colonoscopy April 30 showed a colonic mass.  Transferred from [facility] to to [facility] for surgical evaluation.    Assessment & Plan:   Principal Problem:   Anemia Active Problems:   DM type 2 (diabetes mellitus, type 2) (HCC)   HTN (hypertension), benign   CKD (chronic kidney disease), stage III (HCC)   Hypothyroidism   Bilateral lower extremity edema   Uncontrolled type 2 diabetes mellitus with stage 3 chronic kidney disease (HCC)   Obesity, Class III, BMI  40-49.9 (morbid obesity) (HCC)   CAD (coronary artery disease)   Symptomatic anemia   Acute on chronic diastolic CHF (congestive heart failure) (HCC)   Heme positive stool   Non-intractable vomiting   Abdominal pain, epigastric   Pain of lower extremity   Colonic mass   Colon polyps   Cellulitis of leg, left   1. Symptomatic iron deficiency anemia due to colonic mass/ near the hepatic flexure. Severe iron deficiency (serum iron at 9.0, TIBC at 413, ferritin 4 and transferrin saturation of 2). Patient has received 2 units prbc transfusion and IV iron infusion x1. Biopsy still pending, further workup with CT chest, showed large left hiatal mass 5,4 cm. Abdominal surgery has been placed on hold for now. Pending oncology recommendations, with biopsy results. Suspected advanced metastatic malignancy. Hgb today ay 7,7 with Hct at 26.9,. will keep Hgb above 7.   2. Decompensated diastolic heart failure, acute on chronic.  EF 60 to 65% with moderately increased LV wall thickness. Since admission her fluid balance is negative 15,785 ml. Will continue diuresis with oral furosemide 40 mg po bid. Her blood pressure has been stable with systolic 938 to 182 mmHg.   3. Left leg cellulitis. Today the rash and pain has improved, will hold on antibiotic therapy for now. Continue close clinical follow up.   4. Morbid obesity and dyslipidemia. On atorvastatin. Calculated BMI is 48.7/ will need outpatient follow up.   5. T2DM. Advance diet to diabetic prudent. Continue basal insulin with 20 units daily, 5 units pre-meal, plus sliding scale insulin.  6. Hypothyroid. On levothyroxine.   7. AKI on CKD stage 3. Renal function continue to improve, serum cr  today down to 1,29 with K at 4,3 and serum bicarbonate at 30.  Base cr 1.2 to 1,3.   DVT prophylaxis: heparin   Code Status:  full Family Communication: no family at the bedside  Disposition Plan/ discharge barriers: pending surgical intervention.    Body mass index is 49.42 kg/m. Malnutrition Type:      Malnutrition Characteristics:      Nutrition Interventions:     RN Pressure Injury Documentation:     Consultants:   Surgery   Procedures:   Colon biopsy   Antimicrobials:       Subjective: Patient is very anxious about lung mass. Her lower extremity edema has been persistent, no nausea or vomiting. Dyspnea has improved, no chest pain.   Objective: Vitals:   09/12/18 0814 09/12/18 1013 09/12/18 1017 09/12/18 1124  BP: (!) 115/49   (!) 119/56  Pulse: 62  72 (!) 59  Resp:   18 18  Temp:    98.1 F (36.7 C)  TempSrc:    Oral  SpO2: 100% 99% 99% 99%  Weight:      Height:        Intake/Output Summary (Last 24 hours) at 09/12/2018 1139 Last data filed at 09/12/2018 1100 Gross per 24 hour  Intake 804 ml  Output 1200 ml  Net -396 ml   Filed Weights   09/10/18 1716 09/11/18 0706 09/12/18 0100  Weight: (!) 137.5 kg (!) 137.1 kg (!) 138.9 kg    Examination:   General: deconditioned  Neurology: Awake and alert, non focal  E ENT: mild pallor, no icterus, oral mucosa moist Cardiovascular: No JVD. S1-S2 present, rhythmic, no gallops, rubs, or murmurs. No lower extremity edema. Pulmonary: positive breath sounds bilaterally, no wheezing, rhonchi or rales. Gastrointestinal. Abdomen protuberant with no organomegaly, non tender, no rebound or guarding Skin. No rashes Musculoskeletal: no joint deformities     Data Reviewed: I have personally reviewed following labs and imaging studies  CBC: Recent Labs  Lab 09/07/18 0557 09/08/18 0613 09/09/18 0425 09/12/18 0458  WBC 7.1 8.7 8.2 8.0  HGB 7.8* 8.5* 8.6* 7.7*  HCT 27.1* 29.8* 29.8* 26.9*  MCV 86.3 87.1 87.1 86.8  PLT 284 318 303 096   Basic Metabolic Panel: Recent Labs  Lab 09/06/18 0611 09/07/18 0557 09/08/18 0613 09/09/18 0425 09/11/18 1352 09/12/18 0458  NA 137 138 135 138 133* 135  K 4.6 4.5 4.7 4.1 4.4 4.3  CL 101 103 98 100 96* 98   CO2 28 28 29 28 27 30   GLUCOSE 243* 174* 224* 143* 219* 130*  BUN 45* 50* 49* 43* 31* 29*  CREATININE 1.40* 1.29* 1.28* 1.18* 1.38* 1.29*  CALCIUM 8.6* 8.4* 8.8* 8.8* 8.5* 8.6*  PHOS 4.7*  --   --  4.1  --   --    GFR: Estimated Creatinine Clearance: 60 mL/min (A) (by C-G formula based on SCr of 1.29 mg/dL (H)). Liver Function Tests: Recent Labs  Lab 09/06/18 0611 09/09/18 0425  ALBUMIN 2.8* 3.3*   No results for input(s): LIPASE, AMYLASE in the last 168 hours. No results for input(s): AMMONIA in the last 168 hours. Coagulation Profile: No results for input(s): INR, PROTIME in the last 168 hours. Cardiac Enzymes: No results for input(s): CKTOTAL, CKMB, CKMBINDEX, TROPONINI in the last 168 hours. BNP (last 3 results) No results for input(s): PROBNP in the last 8760 hours. HbA1C: No results for input(s): HGBA1C in the last 72 hours. CBG: Recent Labs  Lab 09/11/18 1153 09/11/18 1638  09/11/18 2115 09/12/18 0654 09/12/18 1043  GLUCAP 182* 145* 95 128* 117*   Lipid Profile: No results for input(s): CHOL, HDL, LDLCALC, TRIG, CHOLHDL, LDLDIRECT in the last 72 hours. Thyroid Function Tests: No results for input(s): TSH, T4TOTAL, FREET4, T3FREE, THYROIDAB in the last 72 hours. Anemia Panel: No results for input(s): VITAMINB12, FOLATE, FERRITIN, TIBC, IRON, RETICCTPCT in the last 72 hours.    Radiology Studies: I have reviewed all of the imaging during this hospital visit personally     Scheduled Meds: . aspirin EC  81 mg Oral Daily  . atorvastatin  80 mg Oral q morning - 10a  . cephALEXin  500 mg Oral Q6H  . cholecalciferol  5,000 Units Oral Daily  . furosemide  40 mg Intravenous BID  . gabapentin  300 mg Oral QID  . Gerhardt's butt cream  1 application Topical TID  . insulin aspart  0-15 Units Subcutaneous TID WC  . insulin aspart  0-5 Units Subcutaneous QHS  . insulin aspart  5 Units Subcutaneous TID WC  . insulin detemir  20 Units Subcutaneous QHS  .  levothyroxine  200 mcg Oral QAC breakfast  . nystatin   Topical BID  . omega-3 acid ethyl esters  1 g Oral Daily  . pantoprazole  40 mg Oral BID  . polyethylene glycol  17 g Oral BID  . senna  2 tablet Oral BID WC  . sodium chloride flush  3 mL Intravenous Q12H  . topiramate  150 mg Oral QPM  . umeclidinium-vilanterol  1 puff Inhalation Daily   Continuous Infusions: . sodium chloride 250 mL (09/10/18 2306)     LOS: 12 days        Mauricio Gerome Apley, MD

## 2018-09-12 NOTE — Progress Notes (Signed)
3 Days Post-Op  Subjective: Denies abd pain. She has been having BM's daily since colonoscopy of 4/30 - brownish. Tolerating HH diet. No emesis.   Objective: Vital signs in last 24 hours: Temp:  [97.2 F (36.2 C)-98.1 F (36.7 C)] 98 F (36.7 C) (05/03 0500) Pulse Rate:  [57-62] 62 (05/03 0814) Resp:  [18] 18 (05/03 0500) BP: (107-123)/(47-67) 115/49 (05/03 0814) SpO2:  [98 %-100 %] 100 % (05/03 0814) Weight:  [138.9 kg] 138.9 kg (05/03 0100) Last BM Date: 09/11/18  Intake/Output from previous day: 05/02 0701 - 05/03 0700 In: 444 [P.O.:444] Out: 2000 [Urine:2000] Intake/Output this shift: Total I/O In: 360 [P.O.:360] Out: -   PE: Gen: Awake and alert, anxious Heart: Regular Lungs: Distant breath sounds. On 2L o2. Cannot talk in complete sentences without pausing 2/2 SOB  Abd: Obese, soft, ND, minimal tenderness LLQ without r/r/g. +BS. Prior umbilical surgical scar.  Msk: B/l LE edema   Lab Results:  Recent Labs    09/12/18 0458  WBC 8.0  HGB 7.7*  HCT 26.9*  PLT 267   BMET Recent Labs    09/11/18 1352 09/12/18 0458  NA 133* 135  K 4.4 4.3  CL 96* 98  CO2 27 30  GLUCOSE 219* 130*  BUN 31* 29*  CREATININE 1.38* 1.29*  CALCIUM 8.5* 8.6*   PT/INR No results for input(s): LABPROT, INR in the last 72 hours. CMP     Component Value Date/Time   NA 135 09/12/2018 0458   K 4.3 09/12/2018 0458   CL 98 09/12/2018 0458   CO2 30 09/12/2018 0458   GLUCOSE 130 (H) 09/12/2018 0458   BUN 29 (H) 09/12/2018 0458   BUN 28 (A) 11/11/2016   CREATININE 1.29 (H) 09/12/2018 0458   CALCIUM 8.6 (L) 09/12/2018 0458   PROT 5.8 (L) 09/02/2018 0500   ALBUMIN 3.3 (L) 09/09/2018 0425   AST 12 (L) 09/02/2018 0500   ALT 10 09/02/2018 0500   ALKPHOS 97 09/02/2018 0500   BILITOT 0.6 09/02/2018 0500   GFRNONAA 43 (L) 09/12/2018 0458   GFRAA 49 (L) 09/12/2018 0458   Lipase     Component Value Date/Time   LIPASE 21 04/05/2018 1629       Studies/Results: Ct Chest  W Contrast  Result Date: 09/11/2018 CLINICAL DATA:  68 year old female inpatient admitted with symptomatic anemia and decompensated heart failure, found to have hepatic flexure colon mass on colonoscopy 09/09/2018. staging evaluation. EXAM: CT CHEST, ABDOMEN, AND PELVIS WITH CONTRAST TECHNIQUE: Multidetector CT imaging of the chest, abdomen and pelvis was performed following the standard protocol during bolus administration of intravenous contrast. CONTRAST:  128mL OMNIPAQUE IOHEXOL 300 MG/ML  SOLN COMPARISON:  08/30/2018 chest radiograph. 01/27/2017 chest CT angiogram. 04/05/2018 CT abdomen/pelvis. FINDINGS: CT CHEST FINDINGS Cardiovascular: Borderline mild cardiomegaly. No significant pericardial effusion/thickening. Left anterior descending coronary atherosclerosis. Atherosclerotic nonaneurysmal thoracic aorta. Dilated main pulmonary artery (3.5 cm diameter). No central pulmonary emboli. Mediastinum/Nodes: No discrete thyroid nodules. Unremarkable esophagus. No axillary adenopathy. Multiple enlarged left prevascular/AP window lymph nodes, largest 1.6 cm (series 3/image 18), new since 01/27/2017 chest CT. New enlarged 2.1 cm subcarinal node (series 3/image 23). No right hilar adenopathy. Lungs/Pleura: No pneumothorax. Trace dependent left pleural effusion. No right pleural effusion. Central perihilar left upper lobe 5.4 x 4.2 cm lung mass (series 3/image 27), new since 01/27/2017 chest CT, with associated high-grade stenosis of the left upper lobe bronchus. Postobstructive consolidation in lingula with some associated volume loss. No significant pulmonary nodules. Mild  passive atelectasis in dependent lower lobes. Musculoskeletal: No aggressive appearing focal osseous lesions. Mild thoracic spondylosis. CT ABDOMEN PELVIS FINDINGS Hepatobiliary: Normal liver size. No liver mass. Normal gallbladder with no radiopaque cholelithiasis. No biliary ductal dilatation. Pancreas: Normal, with no mass or duct dilation.  Spleen: Normal size. No mass. Adrenals/Urinary Tract: Left adrenal 2.4 cm nodule with density 14 HU, stable in size since 04/30/2015 CT, compatible with a benign adenoma. Stable appearance of the right adrenal gland without discrete right adrenal nodule. Punctate nonobstructing interpolar right and upper left renal stones. No hydronephrosis. Subcentimeter hypodense renal cortical lesion in the lower left kidney is too small to characterize and is unchanged, considered benign. No additional renal lesions. Normal bladder. Stomach/Bowel: Small hiatal hernia. Otherwise normal nondistended stomach. Normal caliber small bowel with no small bowel wall thickening. Appendix not discretely visualized. No pericecal inflammatory changes. Oral contrast transits to the right colon. There is an apple-core lesion in the ascending colon measuring 4.0 x 3.9 cm (series 5/image 60) with irregular annular wall thickening. No additional sites of large bowel wall thickening. No significant colonic diverticulosis. No acute pericolonic fat stranding. Moderate diffuse colonic stool volume. Vascular/Lymphatic: Atherosclerotic nonaneurysmal abdominal aorta. Patent portal, splenic, hepatic and renal veins. Top-normal size 0.6 cm right mesenteric node (series 3/image 73). No pathologically enlarged lymph nodes in the abdomen or pelvis. Reproductive: Grossly normal uterus.  No adnexal mass. Other: No pneumoperitoneum, ascites or focal fluid collection. Musculoskeletal: No aggressive appearing focal osseous lesions. Marked lumbar spondylosis. IMPRESSION: 1. Central perihilar left upper lobe 5.4 cm lung mass, new since 01/27/2017 chest CT, compatible with malignancy, favor primary bronchogenic carcinoma. Postobstructive pneumonia/atelectasis in the lingula. 2. Mediastinal adenopathy in the left prevascular/AP window and subcarinal stations, compatible with metastatic nodes (more likely originating from the left upper lobe lung mass). 3. Trace  dependent left pleural effusion. 4. Apple-core lesion in the ascending colon, compatible with known primary colonic neoplasm. Adjacent top-normal size right mesenteric lymph node is equivocal for locoregional nodal metastasis. No additional potential findings of metastatic disease in the abdomen or pelvis. 5. Chronic findings include: Aortic Atherosclerosis (ICD10-I70.0). Borderline mild cardiomegaly. One vessel coronary atherosclerosis. Dilated main pulmonary artery, suggesting pulmonary arterial hypertension. Left adrenal adenoma. Punctate bilateral nephrolithiasis. Small hiatal hernia. Electronically Signed   By: Ilona Sorrel M.D.   On: 09/11/2018 16:42   Ct Abdomen Pelvis W Contrast  Result Date: 09/11/2018 CLINICAL DATA:  68 year old female inpatient admitted with symptomatic anemia and decompensated heart failure, found to have hepatic flexure colon mass on colonoscopy 09/09/2018. staging evaluation. EXAM: CT CHEST, ABDOMEN, AND PELVIS WITH CONTRAST TECHNIQUE: Multidetector CT imaging of the chest, abdomen and pelvis was performed following the standard protocol during bolus administration of intravenous contrast. CONTRAST:  168mL OMNIPAQUE IOHEXOL 300 MG/ML  SOLN COMPARISON:  08/30/2018 chest radiograph. 01/27/2017 chest CT angiogram. 04/05/2018 CT abdomen/pelvis. FINDINGS: CT CHEST FINDINGS Cardiovascular: Borderline mild cardiomegaly. No significant pericardial effusion/thickening. Left anterior descending coronary atherosclerosis. Atherosclerotic nonaneurysmal thoracic aorta. Dilated main pulmonary artery (3.5 cm diameter). No central pulmonary emboli. Mediastinum/Nodes: No discrete thyroid nodules. Unremarkable esophagus. No axillary adenopathy. Multiple enlarged left prevascular/AP window lymph nodes, largest 1.6 cm (series 3/image 18), new since 01/27/2017 chest CT. New enlarged 2.1 cm subcarinal node (series 3/image 23). No right hilar adenopathy. Lungs/Pleura: No pneumothorax. Trace dependent left  pleural effusion. No right pleural effusion. Central perihilar left upper lobe 5.4 x 4.2 cm lung mass (series 3/image 27), new since 01/27/2017 chest CT, with associated high-grade stenosis of the left  upper lobe bronchus. Postobstructive consolidation in lingula with some associated volume loss. No significant pulmonary nodules. Mild passive atelectasis in dependent lower lobes. Musculoskeletal: No aggressive appearing focal osseous lesions. Mild thoracic spondylosis. CT ABDOMEN PELVIS FINDINGS Hepatobiliary: Normal liver size. No liver mass. Normal gallbladder with no radiopaque cholelithiasis. No biliary ductal dilatation. Pancreas: Normal, with no mass or duct dilation. Spleen: Normal size. No mass. Adrenals/Urinary Tract: Left adrenal 2.4 cm nodule with density 14 HU, stable in size since 04/30/2015 CT, compatible with a benign adenoma. Stable appearance of the right adrenal gland without discrete right adrenal nodule. Punctate nonobstructing interpolar right and upper left renal stones. No hydronephrosis. Subcentimeter hypodense renal cortical lesion in the lower left kidney is too small to characterize and is unchanged, considered benign. No additional renal lesions. Normal bladder. Stomach/Bowel: Small hiatal hernia. Otherwise normal nondistended stomach. Normal caliber small bowel with no small bowel wall thickening. Appendix not discretely visualized. No pericecal inflammatory changes. Oral contrast transits to the right colon. There is an apple-core lesion in the ascending colon measuring 4.0 x 3.9 cm (series 5/image 60) with irregular annular wall thickening. No additional sites of large bowel wall thickening. No significant colonic diverticulosis. No acute pericolonic fat stranding. Moderate diffuse colonic stool volume. Vascular/Lymphatic: Atherosclerotic nonaneurysmal abdominal aorta. Patent portal, splenic, hepatic and renal veins. Top-normal size 0.6 cm right mesenteric node (series 3/image 73). No  pathologically enlarged lymph nodes in the abdomen or pelvis. Reproductive: Grossly normal uterus.  No adnexal mass. Other: No pneumoperitoneum, ascites or focal fluid collection. Musculoskeletal: No aggressive appearing focal osseous lesions. Marked lumbar spondylosis. IMPRESSION: 1. Central perihilar left upper lobe 5.4 cm lung mass, new since 01/27/2017 chest CT, compatible with malignancy, favor primary bronchogenic carcinoma. Postobstructive pneumonia/atelectasis in the lingula. 2. Mediastinal adenopathy in the left prevascular/AP window and subcarinal stations, compatible with metastatic nodes (more likely originating from the left upper lobe lung mass). 3. Trace dependent left pleural effusion. 4. Apple-core lesion in the ascending colon, compatible with known primary colonic neoplasm. Adjacent top-normal size right mesenteric lymph node is equivocal for locoregional nodal metastasis. No additional potential findings of metastatic disease in the abdomen or pelvis. 5. Chronic findings include: Aortic Atherosclerosis (ICD10-I70.0). Borderline mild cardiomegaly. One vessel coronary atherosclerosis. Dilated main pulmonary artery, suggesting pulmonary arterial hypertension. Left adrenal adenoma. Punctate bilateral nephrolithiasis. Small hiatal hernia. Electronically Signed   By: Ilona Sorrel M.D.   On: 09/11/2018 16:42    Anti-infectives: Anti-infectives (From admission, onward)   Start     Dose/Rate Route Frequency Ordered Stop   09/11/18 1300  cephALEXin (KEFLEX) capsule 500 mg     500 mg Oral Every 6 hours 09/11/18 1253     09/07/18 1615  ceFAZolin (ANCEF) IVPB 1 g/50 mL premix  Status:  Discontinued     1 g 100 mL/hr over 30 Minutes Intravenous Every 8 hours 09/07/18 1605 09/11/18 1253   09/05/18 1715  cefTRIAXone (ROCEPHIN) 2 g in sodium chloride 0.9 % 100 mL IVPB  Status:  Discontinued     2 g 200 mL/hr over 30 Minutes Intravenous Every 24 hours 09/05/18 1705 09/06/18 1757   09/05/18 1645   cefTRIAXone (ROCEPHIN) 1 g in sodium chloride 0.9 % 100 mL IVPB  Status:  Discontinued     1 g 200 mL/hr over 30 Minutes Intravenous Every 24 hours 09/05/18 1634 09/05/18 1705       Assessment/Plan COPD on 2L home O2 DM CHF admitted for fluid overload CAD s/p stent 1/19 HTN Lymphedema  Anemia   Right Colon Mass - Colonoscopy at [facility] on 4/30 with colon mass near the hepatic flexure, which was biopsied and tattooed.  - Pathology pending - CEA 2.1 - CT chest shows 5 cm mass concerning for malignancy. Recommend medical oncology consultation for further evaluation and workup;  - Poor pulmonary function that would need to be optimized prior to a surgery.  - Cardiology note at [facility] from 5/1 places patient at 11% chance of major adverse cardiac events. Appreciate cardiology following and their input.  -No immediate plans for surgery - needs evaluation by medical oncology FEN - Diet as tolerated VTE - SCD, okay for chemical prophylaxis from a surgical standpoint   LOS: 12 days   Ileana Roup , MD [facility] Surgery 09/12/2018, 8:40 AM Pager: [phone]

## 2018-09-13 ENCOUNTER — Encounter (HOSPITAL_COMMUNITY)
Admission: EM | Disposition: A | Discharge status: Home Health | Payer: Self-pay | Source: Home / Self Care | Attending: Internal Medicine

## 2018-09-13 ENCOUNTER — Encounter (HOSPITAL_COMMUNITY): Payer: Self-pay | Admitting: Internal Medicine

## 2018-09-13 DIAGNOSIS — R918 Other nonspecific abnormal finding of lung field: Secondary | ICD-10-CM

## 2018-09-13 DIAGNOSIS — Z7189 Other specified counseling: Secondary | ICD-10-CM

## 2018-09-13 DIAGNOSIS — D500 Iron deficiency anemia secondary to blood loss (chronic): Secondary | ICD-10-CM

## 2018-09-13 DIAGNOSIS — Z515 Encounter for palliative care: Secondary | ICD-10-CM

## 2018-09-13 LAB — BASIC METABOLIC PANEL
Anion gap: 8 (ref 5–15)
BUN: 35 mg/dL — ABNORMAL HIGH (ref 8–23)
CO2: 28 mmol/L (ref 22–32)
Calcium: 8.4 mg/dL — ABNORMAL LOW (ref 8.9–10.3)
Chloride: 99 mmol/L (ref 98–111)
Creatinine, Ser: 1.44 mg/dL — ABNORMAL HIGH (ref 0.44–1.00)
GFR calc Af Amer: 43 mL/min — ABNORMAL LOW (ref >60)
GFR calc non Af Amer: 37 mL/min — ABNORMAL LOW (ref >60)
Glucose, Bld: 197 mg/dL — ABNORMAL HIGH (ref 70–99)
Potassium: 4.4 mmol/L (ref 3.5–5.1)
Sodium: 135 mmol/L (ref 135–145)

## 2018-09-13 LAB — GLUCOSE, CAPILLARY
Glucose-Capillary: 184 mg/dL — ABNORMAL HIGH (ref 70–99)
Glucose-Capillary: 144 mg/dL — ABNORMAL HIGH (ref 70–99)
Glucose-Capillary: 203 mg/dL — ABNORMAL HIGH (ref 70–99)
Glucose-Capillary: 207 mg/dL — ABNORMAL HIGH (ref 70–99)

## 2018-09-13 SURGERY — COLECTOMY, PARTIAL
Anesthesia: General

## 2018-09-13 NOTE — Progress Notes (Signed)
Physical Therapy Treatment Patient Details Name: Stephanie Combs MRN: [id number] DOB: 06-06-1950 Today's Date: 09/13/2018    History of Present Illness 68 y.o. female, w CAD s/p DES prox LAD, CHF (EF 60-65%), Dm2, CKD stage3, Hypertension, Hypothyroidism, Chronic lymphedema, with c/o dyspnea for the past 5 days, along with increase in lower ext edema and R LQ pain.  Hx of diverticulosis. Colonoscopy reveals R colon mass. Transferred to [facility] for surgery 5/4.    PT Comments    Pt with c/o of L shoulder pain this morning. Pt limited in safe mobility by decreased strength and endurance. Pt currently supervision for bed mobility and transfers and min guard for ambulation of 3 feet to recliner. Surgical PA in room for update on surgery so session abbreviated. Pt left in chair. PT will continue to follow acutely.   Follow Up Recommendations  Home health PT;Supervision for mobility/OOB;Supervision - Intermittent     Equipment Recommendations  None recommended by PT    Recommendations for Other Services       Precautions / Restrictions Precautions Precautions: Fall Restrictions Weight Bearing Restrictions: No    Mobility  Bed Mobility Overal bed mobility: Needs Assistance Bed Mobility: Supine to Sit;Sit to Supine     Supine to sit: Supervision Sit to supine: Min assist   General bed mobility comments: supervsion for coming to EoB, requiring increased time and effort. minA for lifting R LE back into bed  Transfers Overall transfer level: Needs assistance Equipment used: Rolling walker (2 wheeled) Transfers: Sit to/from Stand Sit to Stand: Supervision         General transfer comment: supervision for safety, increased time and effort to come to standing and steady self  Ambulation/Gait Ambulation/Gait assistance: Min guard Gait Distance (Feet): 3 Feet Assistive device: Rolling walker (2 wheeled) Gait Pattern/deviations: Decreased step length - right;Decreased step length -  left;Decreased stride length Gait velocity: slowed Gait velocity interpretation: <1.31 ft/sec, indicative of household ambulator General Gait Details: slow waddling steps with wide Bos due to pannus, min guard for safety      Balance Overall balance assessment: Needs assistance Sitting-balance support: Feet supported;No upper extremity supported Sitting balance-Leahy Scale: Good     Standing balance support: During functional activity;Single extremity supported Standing balance-Leahy Scale: Poor Standing balance comment: requires single UE support for dynamic balance                            Cognition Arousal/Alertness: Awake/alert Behavior During Therapy: WFL for tasks assessed/performed Overall Cognitive Status: Within Functional Limits for tasks assessed                                           General Comments General comments (skin integrity, edema, etc.): Pt with 2L O2 via B[place], VSS      Pertinent Vitals/Pain Pain Assessment: 0-10 Pain Score: 6  Pain Location: Rt shoulder and BLE Pain Descriptors / Indicators: Discomfort;Pressure Pain Intervention(s): Limited activity within patient's tolerance;Monitored during session;Repositioned           PT Goals (current goals can now be found in the care plan section) Acute Rehab PT Goals PT Goal Formulation: With patient Time For Goal Achievement: 09/25/18 Potential to Achieve Goals: Fair Progress towards PT goals: Progressing toward goals    Frequency    Min 3X/week      PT  Plan Current plan remains appropriate       AM-PAC PT "6 Clicks" Mobility   Outcome Measure  Help needed turning from your back to your side while in a flat bed without using bedrails?: None Help needed moving from lying on your back to sitting on the side of a flat bed without using bedrails?: None Help needed moving to and from a bed to a chair (including a wheelchair)?: A Little Help needed standing up  from a chair using your arms (e.g., wheelchair or bedside chair)?: A Little Help needed to walk in hospital room?: A Little Help needed climbing 3-5 steps with a railing? : A Lot 6 Click Score: 19    End of Session Equipment Utilized During Treatment: Oxygen Activity Tolerance: Patient tolerated treatment well;Patient limited by fatigue Patient left: with call bell/phone within reach;in chair(surgical PA in room) Nurse Communication: Mobility status PT Visit Diagnosis: Unsteadiness on feet (R26.81);Other abnormalities of gait and mobility (R26.89);Muscle weakness (generalized) (M62.81)     Time: 3685-9923 PT Time Calculation (min) (ACUTE ONLY): 9 min  Charges:  $Gait Training: 8-22 mins                     Safiyya Stokes B. Migdalia Dk PT, DPT Acute Rehabilitation Services Pager [phone] Office [phone]    [place] 09/13/2018, 9:44 AM

## 2018-09-13 NOTE — H&P (View-Only) (Signed)
NAME:  Stephanie Combs, MRN:  [id number], DOB:  1951-01-22, LOS: 66 ADMISSION DATE:  08/30/2018, CONSULTATION DATE:  5/4 REFERRING MD:  arrien, CHIEF COMPLAINT:  Lung mass   Brief History   68 year old female patient who was transferred to [facility] from [facility] on 5/2 for surgical evaluation of colonic mass.  During staging evaluation was found also have left perihilar lung mass with mediastinal adenopathy by CT scan on 5/2 because of this pulmonary consulted History of present illness   68 year old female who was admitted initially w/ 5d h/o worsening shortness of breath and LE swelling. This was refractory to oral diuretic titration so presented to ER. ED eval: L lung effusion/atx, hgb 7. She was admitted w/ working dx of decompensated diastolic HF, lower extremity cellulitis and symptomatic anemia.  She was transfused 2 units of blood, aggressively diuresed, 14 L off since admission.  Ultimately had GI consultation due to heme positive stool and underwent EGD and colonoscopy on April 30 which demonstrated colonic mass.  A biopsy was obtained.  A CT of chest was obtained at the direction of general surgery consultation on 5/2 this demonstrated 5 cm mass in the left perihilar region for which pulmonary was consulted. -Lives independently -At baseline can ambulate about 70 feet prior to dyspnea -Stop smoking about 4 and half years ago -Retired used to be a Museum/gallery conservator -does have famil;y h/o lung cancer Past Medical History  CAD, chronic diastolic HF, obesity, CKD III, HTN, chronic lymphedema, hypothyroidism  Significant Hospital Events   4/20 admitted with decompensated heart failure, symptomatic anemia.  Has fused and aggressively diuresed. 4/28: GI consulted for heme positive stool 4/30: EGD and colonoscopy: Showed colonic mass biopsies taken mass tattooed 5/2: Transferred to [facility] seen by general surgery CT chest ordered for staging purposes 5/4: Pulmonary  asked to see due to left perihilar mass.  Feeling better from pulmonary standpoint, has diuresed 15.7 L Consults:  GI medicine at [facility] General surgery Pulmonary  Procedures:  EGD and colonoscopy 4/30  Significant Diagnostic Tests:  CT chest and abdomen 5/2: Central perihilar left upper lobe lung mass 5.4 cm in measurement mediastinal adenopathy in the left prevascular AP window and subcarinal station apple core lesion of the ascending colon compatible with known colonic mass  Micro Data:    Antimicrobials:     Interim history/subjective:  Feels better  Objective   Blood pressure 122/77, pulse 80, temperature 98.8 F (37.1 C), temperature source Oral, resp. rate (Abnormal) 21, height 5\' 6"  (1.676 m), weight (Abnormal) 138 kg, SpO2 98 %.    FiO2 (%):  [28 %] 28 %   Intake/Output Summary (Last 24 hours) at 09/13/2018 1449 Last data filed at 09/13/2018 1215 Gross per 24 hour  Intake 1992.25 ml  Output 1701 ml  Net 291.25 ml   Filed Weights   09/11/18 0706 09/12/18 0100 09/13/18 0629  Weight: (Abnormal) 137.1 kg (Abnormal) 138.9 kg (Abnormal) 138 kg    Examination: General: Obese 68 year old female resting in chair no acute distress HENT: Normocephalic atraumatic no jugular venous distention Lungs: Clear diminished bases Cardiovascular: Regular rate and rhythm Abdomen: Obese nontender Extremities: Chronic lower extremity changes edema improved per patient strong pulses warm to palpation Neuro: Awake oriented GU: Voids spontaneously  Resolved Hospital Problem list   Lower extremity cellulitis  Assessment & Plan:  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  Hemoglobin trending down once again -Seen by surgery and felt not a good surgical candidate Plan Transfusion ordered by primary service  follow-up pathology Needs oncology  consult  Morbid obesity Plan Pulse oximetry Careful postoperative observation given risk for postoperative respiratory failure  GERD w/ upper airway irritation Plan PPI BID  CKD stage III. Serum creatinine elevated some, looks like baseline about 1.18 now up to 1.44 Plan Careful observation with ongoing diuresis, this may need to be back down Continue strict INO Renal dose as appropriate  Acute on chronic diastolic heart failure Clinically nearing baseline Plan Continuing diuresis and blood pressure control  Hypothyroidism Plan Continue replacement  Diabetes Plan Sliding scale insulin   Best practice:  Diet: npo after midnight  Pain/Anxiety/Delirium protocol (if indicated): na VAP protocol (if indicated): na DVT prophylaxis: none due to bleeding GI prophylaxis: ppi Glucose control: ssi Mobility: oob Code Status: full code  Family Communication: pending  Disposition: for EBUS   Labs   CBC: Recent Labs  Lab 09/07/18 0557 09/08/18 0613 09/09/18 0425 09/12/18 0458  WBC 7.1 8.7 8.2 8.0  HGB 7.8* 8.5* 8.6* 7.7*  HCT 27.1* 29.8* 29.8* 26.9*  MCV 86.3 87.1 87.1 86.8  PLT 284 318 303 017    Basic Metabolic Panel: Recent Labs  Lab 09/08/18 0613 09/09/18 0425 09/11/18 1352 09/12/18 0458 09/13/18 0554  NA 135 138 133* 135 135  K 4.7 4.1 4.4 4.3 4.4  CL 98 100 96* 98 99  CO2 29 28 27 30 28   GLUCOSE 224* 143* 219* 130* 197*  BUN 49* 43* 31* 29* 35*  CREATININE 1.28* 1.18* 1.38* 1.29* 1.44*  CALCIUM 8.8* 8.8* 8.5* 8.6* 8.4*  PHOS  --  4.1  --   --   --    GFR: Estimated Creatinine Clearance: 53.6 mL/min (A) (by C-G formula based on SCr of 1.44 mg/dL (H)). Recent Labs  Lab 09/07/18 0557 09/08/18 0613 09/09/18 0425 09/12/18 0458  WBC 7.1 8.7 8.2 8.0    Liver Function Tests: Recent Labs  Lab 09/09/18 0425  ALBUMIN 3.3*   No results for input(s): LIPASE, AMYLASE in the last 168 hours. No results for input(s): AMMONIA in the last 168 hours.   ABG    Component Value Date/Time   PHART 7.325 (L) 01/08/2018 2115   PCO2ART 48.4 (H) 01/08/2018 2115   PO2ART 109.0 (H) 01/08/2018 2115   HCO3 23.6 01/08/2018 2115   TCO2 31 05/19/2017 1602   TCO2 30 05/19/2017 1602   ACIDBASEDEF 0.7 01/08/2018 2115   O2SAT 97.9 01/08/2018 2115     Coagulation Profile: No results for input(s): INR, PROTIME in the last 168 hours.  Cardiac Enzymes: No results for input(s): CKTOTAL, CKMB, CKMBINDEX, TROPONINI in the last 168 hours.  HbA1C: Hemoglobin A1C  Date/Time Value Ref Range Status  11/11/2016 7.5  Final  08/09/2016 9.6  Final   Hgb A1c MFr Bld  Date/Time Value Ref Range Status  08/30/2018 09:15 PM 7.5 (H) 4.8 - 5.6 % Final    Comment:    (NOTE)         Prediabetes: 5.7 - 6.4         Diabetes: >6.4         Glycemic control for adults with diabetes: <7.0   07/05/2018 11:38 AM 7.9 (H) 4.8 - 5.6 % Final    Comment:    (NOTE) Pre diabetes:          5.7%-6.4% Diabetes:              >  6.4% Glycemic control for   <7.0% adults with diabetes     CBG: Recent Labs  Lab 09/12/18 1043 09/12/18 1609 09/12/18 2127 09/13/18 0634 09/13/18 1109  GLUCAP 117* 182* 188* 184* 144*    Review of Systems:   Review of Systems  Constitutional: Positive for chills and malaise/fatigue. Negative for fever and weight loss.  HENT: Negative.   Eyes: Negative.   Respiratory: Positive for shortness of breath and wheezing. Negative for cough, hemoptysis and sputum production.   Cardiovascular: Positive for chest pain, orthopnea and leg swelling.  Gastrointestinal: Positive for abdominal pain and heartburn.  Genitourinary: Negative.   Musculoskeletal: Negative.   Skin: Negative.   Neurological: Negative.   Endo/Heme/Allergies: Negative.   Psychiatric/Behavioral: Negative.      Past Medical History  She,  has a past medical history of Anxiety, CAD (coronary artery disease), CHF (congestive heart failure) ([place]), Chronic back pain, Chronic pain,  COPD (chronic obstructive pulmonary disease) ([place]), Degenerative disk disease, Diabetes mellitus without complication ([place]), History of medication noncompliance (11/2017), Hypertension, Hypothyroidism, On home O2, and Pedal edema.   Surgical History    Past Surgical History:  Procedure Laterality Date  . BIOPSY  09/09/2018   Procedure: BIOPSY;  Surgeon: Daneil Dolin, MD;  [facility];  Service: Endoscopy;;  gastric esophageal hepatic flexure colon  . COLONOSCOPY WITH PROPOFOL N/A 09/09/2018   Procedure: COLONOSCOPY WITH PROPOFOL;  Surgeon: Daneil Dolin, MD;  Location: [facility];  Service: Endoscopy;  Laterality: N/A;  . CORONARY STENT INTERVENTION N/A 05/22/2017   Procedure: CORONARY STENT INTERVENTION;  Surgeon: Martinique, Capri Raben M, MD;  Location: [place] [facility];  Service: Cardiovascular;  Laterality: N/A;  . ESOPHAGOGASTRODUODENOSCOPY (EGD) WITH PROPOFOL N/A 09/09/2018   Procedure: ESOPHAGOGASTRODUODENOSCOPY (EGD) WITH PROPOFOL;  Surgeon: Daneil Dolin, MD;  [facility];  Service: Endoscopy;  Laterality: N/A;  . HERNIA REPAIR    . POLYPECTOMY  09/09/2018   Procedure: POLYPECTOMY;  Surgeon: Daneil Dolin, MD;  [facility];  Service: Endoscopy;;  colon  . RIGHT/LEFT HEART CATH AND CORONARY ANGIOGRAPHY N/A 05/19/2017   Procedure: RIGHT/LEFT HEART CATH AND CORONARY ANGIOGRAPHY;  Surgeon: Leonie Man, MD;  Location: [place] [facility];  Service: Cardiovascular;  Laterality: N/A;  . TUBAL LIGATION       Social History   reports that she quit smoking about 3 years ago. Her smoking use included cigarettes. She smoked 0.25 packs per day. She has never used smokeless tobacco. She reports that she does not drink alcohol or use drugs.   Family History   Her family history includes Cerebral palsy in her brother; Diabetes in her brother and another family member; Heart attack in her father and another family member; Pneumonia in her brother; Stroke in  her mother. There is no history of Colon cancer.   Allergies Allergies  Allergen Reactions  . Dilaudid [Hydromorphone Hcl] Itching  . Midodrine Hcl Swelling    After one dose had anaphylactic reaction, had to call EMS.  Marland Kitchen Actifed Cold-Allergy [Chlorpheniramine-Phenylephrine]     "I was sick and red and it didn't agree with me at all"  . Doxycycline Nausea And Vomiting  . Other Cough    Pt states she is allergic to ragweed and that she starts coughing and sneezing like crazy  . Penicillins Hives and Itching    Tolerates Rocephin Has patient had a PCN reaction causing immediate rash, facial/tongue/throat swelling, SOB or lightheadedness with hypotension: Yes Has  patient had a PCN reaction causing severe rash involving mucus membranes or skin necrosis: No Has patient had a PCN reaction that required hospitalization Yes Has patient had a PCN reaction occurring within the last 10 years: No If all of the above answers are "NO", then may proceed with Cephalosporin use.   . Reglan [Metoclopramide] Itching  . Valium [Diazepam] Itching  . Vistaril [Hydroxyzine Hcl] Itching     Home Medications  Prior to Admission medications   Medication Sig Start Date End Date Taking? Authorizing Provider  albuterol (PROAIR HFA) 108 (90 Base) MCG/ACT inhaler Inhale 2 puffs into the lungs every 4 (four) hours as needed. For shortness of breath 04/09/18  Yes Johnson, Clanford L, MD  albuterol (PROVENTIL) (2.5 MG/3ML) 0.083% nebulizer solution Take 2.5 mg by nebulization 2 (two) times daily as needed for wheezing or shortness of breath.    Yes [provider]  ALPRAZolam Duanne Moron) 1 MG tablet Take 1 mg by mouth 3 (three) times daily as needed for anxiety or sleep.  01/05/17  Yes [provider]  aspirin EC 81 MG tablet Take 81 mg by mouth daily.   Yes [provider]  atorvastatin (LIPITOR) 80 MG tablet Take 1 tablet (80 mg total) by mouth daily at 6 PM. Patient taking differently: Take  80 mg by mouth every morning.  05/26/17  Yes [name], MD  Cholecalciferol (VITAMIN D3) 5000 units CAPS Take 1 capsule (5,000 Units total) by mouth daily. 08/19/16  Yes Nida, Marella Chimes, MD  docusate sodium (COLACE) 100 MG capsule Take 300 mg by mouth daily.   Yes [provider]  ferrous sulfate 325 (65 FE) MG tablet Take 1 tablet (325 mg total) by mouth daily with breakfast. 02/05/17  Yes [name], MD  furosemide (LASIX) 20 MG tablet Take 40mg  po in the morning and 20mg  in the evening Patient taking differently: Take 20-40 mg by mouth See admin instructions. Take 40mg  po in the morning and 20mg  in the evening 08/25/18  Yes Strader, [name] M, PA-C  gabapentin (NEURONTIN) 600 MG tablet Take 600 mg by mouth 4 (four) times daily.   Yes [provider]  Hydrocortisone (GERHARDT'S BUTT CREAM) CREA Apply 1 application topically 3 (three) times daily. 05/26/17  Yes [name], MD  insulin aspart (NOVOLOG) 100 UNIT/ML injection Inject 1-18 Units into the skin 3 (three) times daily with meals. Patient taking differently: Inject 1-20 Units into the skin 3 (three) times daily with meals.  04/09/18  Yes Johnson, Clanford L, MD  insulin detemir (LEVEMIR) 100 UNIT/ML injection Inject 0.4 mLs (40 Units total) into the skin at bedtime. 11/26/17  Yes Tat, Shanon Brow, MD  levothyroxine (SYNTHROID, LEVOTHROID) 200 MCG tablet Take 1 tablet by mouth daily. 06/24/18  Yes [provider]  nitroGLYCERIN (NITROSTAT) 0.4 MG SL tablet Place 0.4 mg under the tongue every 5 (five) minutes as needed for chest pain.  03/26/18  Yes [provider]  nystatin (MYCOSTATIN/NYSTOP) powder Apply topically 2 (two) times daily. 10/10/16  Yes Eber Jones, MD  Omega-3 Fatty Acids (FISH OIL PO) Take 1 capsule by mouth daily.    Yes [provider]  ondansetron (ZOFRAN) 8 MG tablet Take 8 mg by mouth every 8 (eight) hours as needed for nausea or vomiting.  08/11/18  Yes [provider]   oxyCODONE-acetaminophen (PERCOCET) 10-325 MG tablet Take 1 tablet by mouth every 6 (six) hours as needed for pain.   Yes [provider]  OXYGEN Inhale 2  L into the lungs continuous.   Yes [provider]  pantoprazole (PROTONIX) 40 MG tablet Take 1 tablet (40 mg total) by mouth 2 (two) times daily. 08/25/18  Yes Strader, [name] M, PA-C  potassium chloride (MICRO-K) 10 MEQ CR capsule Take 2 capsules (20 mEq total) by mouth 2 (two) times daily. *May take one additional tablet as needed for cramping 04/23/18  Yes Herminio Commons, MD  tiZANidine (ZANAFLEX) 4 MG tablet Take 1 tablet (4 mg total) by mouth every 8 (eight) hours as needed for muscle spasms. 07/08/18  Yes Kathie Dike, MD  topiramate (TOPAMAX) 50 MG tablet Take 150 mg by mouth every evening.  03/18/18  Yes [provider]  umeclidinium-vilanterol (ANORO ELLIPTA) 62.5-25 MCG/INH AEPB Inhale 1 puff into the lungs daily. Patient taking differently: Inhale 1 puff into the lungs daily as needed (for shortness of breath).  05/26/17  Yes [name], MD  SURE COMFORT INS SYR 1CC/28G 28G X 1/2" 1 ML MISC USE TO INJECT INSULIN UP TO 4 TIMES DAILY. 03/06/17   Cassandria Anger, MD     Erick Colace ACNP-BC [place] Pager # [phone] OR # [phone] if no answer

## 2018-09-13 NOTE — Consult Note (Signed)
Consultation Note Date: 09/13/2018   Patient Name: Stephanie Combs  DOB: 1950-09-23  MRN: [id number]  Age / Sex: 68 y.o., female   PCP: [name], MD Referring Physician: Tawni Millers,*   REASON FOR CONSULTATION:Establishing goals of care  Palliative Care consult requested for this 68 y.o. female with multiple medical problems including hypothyroidism, hypertension, chronic kidney disease stage III, type 2 diabetes, chronic lymphedema, diastolic congestive heart failure, CAD s/p DES. She was admitted after concerns for worsening dyspnea and increased lower edema. Since admission she has received 2 units PRBC due to hemoglobin 7.0. 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 She was transferred to [facility] from [facility] for further work-up and surgical evaluation.   Clinical Assessment and Goals of Care: I have reviewed medical records including lab results, imaging, Epic notes, and MAR, received report from the bedside RN, and assessed the patient. I met at the bedside with [name]  to discuss diagnosis prognosis, GOC, EOL wishes, disposition and options. She is awake, alert, and oriented x3 sitting up in recliner. She denied pain, endorses shortness of breath with increased exertion, and some anxiety related to her recent diagnosis of possible cancer. I offered to include other family members in discussion for support via phone given COVID-19 visitor restrictions, however patient declined and stated she does not want to discuss with her daughter until she has more definitive answers as she did not want her to be home worried and concerned.   I introduced Palliative Medicine as specialized medical care for people living with serious illness. It focuses on providing relief from the symptoms and stress of a serious illness. The goal is to improve quality of life for both the patient and the family.  We discussed a  brief life review of the patient, along with her functional and nutritional status. [name] reports she lives alone and her daughter lives a few blocks over from her. She relies heavily on her family and church members support. She has a cat named "Little Man" who she loves.   Prior to admission she reports being independent of ADLs. She is on home oxygen, however she is ambulatory and can prepare meals etc. Independently. She reports a good appetite.   We discussed Her current illness and what it means in the larger context of Her on-going co-morbidities. With specific discussions regarding her shortness of breath, cardiac history, and newly found colonic and hilar mass. Natural disease trajectory and expectations at EOL were discussed. Patient expresses anxiety with the recent news of possibility having cancer. She is anxious to be able to have a discussion with Oncology and hear their opinion regarding her condition, pending biopsy results, and any available options. She reports she is open to any kind of treatment options offered but is worried with her co-morbidities that she may be limited both medically and surgically. Support given.   She states "I wish they would have not told me I had cancer until I spoke with the cancer doctor. I want to know answers and my mind is racing. I never thought I may die from cancer, with all the other health problems I had I figured my life was on borrowed time and now I am really borrowing time from God!" Support given.   Patient states her family has an extensive cancer history. She reports many of her family members are currently being treated  for cancer. She states when she was 21 she buried her mother and father 14 days apart due to heart conditions and cancer. 5 years ago her husband and brother passed away a little more than months apart one due to cancer. She states she currently has a cousin who passed away 2 months ago from cancer, who had a history of  colon cancer and did well for more than 20 years and it came back and was metastasized. She states one cousin is currently undergoing colon ca treatment, 2 undergoing leukemia treatments, and 2 have lung cancer.   I attempted to elicit values and goals of care important to the patient.    The difference between aggressive medical intervention and comfort care was considered in light of the patient's goals of care. [name] again states she knows her health is not the best, however she is open to any forms of surgical interventions and treatment options that are available. She is tearful in stating she wants to live longer for her daughter, however she knows it will be in God's hands. She again mentions she is anxious to hear from Oncology and remains hopeful for treatment options which she would take at [facility].   She reports she does not have an official advance directive, however her daughter, Silvano Rusk would be her designated medical decision maker in the event she is needed to assist with decisions. She confirms DNR/DNI. She again remains hopeful for treatment but does not want heroic measures in a sudden cardiac/respiratory event. She states she would not want a tracheostomy or any forms of artificial feeding such as a PEG.   Hospice and Palliative Care services outpatient were explained and offered. Patient  verbalized their understanding and awareness of both palliative and hospice's goals and philosophy of care. Given her wishes are to continue with full scope aggressive medical interventions, recommendations for palliative support was given. She verbalized understanding and expressed once discharged and she had a better understanding of her options she would like to have palliative's support.   Questions and concerns were addressed. Patient was encouraged to call with questions or concerns.  PMT will continue to support holistically.   SOCIAL HISTORY:     reports that she quit smoking  about 3 years ago. Her smoking use included cigarettes. She smoked 0.25 packs per day. She has never used smokeless tobacco. She reports that she does not drink alcohol or use drugs.  CODE STATUS: DNR  ADVANCE DIRECTIVES: Primary Decision Maker: Patient     SYMPTOM MANAGEMENT: per attending   Palliative Prophylaxis:   Aspiration, Bowel Regimen and Frequent Pain Assessment  PSYCHO-SOCIAL/SPIRITUAL:  Support System: Family   Desire for further Chaplaincy support:NO   Additional Recommendations (Limitations, Scope, Preferences):  Full Scope Treatment   PAST MEDICAL HISTORY: Past Medical History:  Diagnosis Date  . Anxiety   . CAD (coronary artery disease)    a. s/p DES to LAD and angioplasty to D1 in 05/2017  . CHF (congestive heart failure) (HCC)    Diastolic  . Chronic back pain   . Chronic pain   . COPD (chronic obstructive pulmonary disease) ([place])   . Degenerative disk disease   . Diabetes mellitus without complication ([place])   . History of medication noncompliance 11/2017   "the patient frequently self-adjusts medications without notifying her physicians"  . Hypertension   . Hypothyroidism   . On home O2    2.5 L N/C prn  . Pedal edema  PAST SURGICAL HISTORY:  Past Surgical History:  Procedure Laterality Date  . BIOPSY  09/09/2018   Procedure: BIOPSY;  Surgeon: Daneil Dolin, MD;  [facility];  Service: Endoscopy;;  gastric esophageal hepatic flexure colon  . COLONOSCOPY WITH PROPOFOL N/A 09/09/2018   Procedure: COLONOSCOPY WITH PROPOFOL;  Surgeon: Daneil Dolin, MD;  [facility];  Service: Endoscopy;  Laterality: N/A;  . CORONARY STENT INTERVENTION N/A 05/22/2017   Procedure: CORONARY STENT INTERVENTION;  Surgeon: Martinique, Peter M, MD;  Location: [place] [facility];  Service: Cardiovascular;  Laterality: N/A;  . ESOPHAGOGASTRODUODENOSCOPY (EGD) WITH PROPOFOL N/A 09/09/2018   Procedure: ESOPHAGOGASTRODUODENOSCOPY (EGD) WITH PROPOFOL;   Surgeon: Daneil Dolin, MD;  [facility];  Service: Endoscopy;  Laterality: N/A;  . HERNIA REPAIR    . POLYPECTOMY  09/09/2018   Procedure: POLYPECTOMY;  Surgeon: Daneil Dolin, MD;  [facility];  Service: Endoscopy;;  colon  . RIGHT/LEFT HEART CATH AND CORONARY ANGIOGRAPHY N/A 05/19/2017   Procedure: RIGHT/LEFT HEART CATH AND CORONARY ANGIOGRAPHY;  Surgeon: Leonie Man, MD;  Location: [place] [facility];  Service: Cardiovascular;  Laterality: N/A;  . TUBAL LIGATION      ALLERGIES:  is allergic to dilaudid [hydromorphone hcl]; midodrine hcl; actifed cold-allergy [chlorpheniramine-phenylephrine]; doxycycline; other; penicillins; reglan [metoclopramide]; valium [diazepam]; and vistaril [hydroxyzine hcl].   MEDICATIONS:  Current Facility-Administered Medications  Medication Dose Route Frequency Provider Last Rate Last Dose  . 0.9 %  sodium chloride infusion  250 mL Intravenous PRN Daneil Dolin, MD   Stopped at 09/11/18 1716  . acetaminophen (TYLENOL) tablet 650 mg  650 mg Oral Q6H PRN Daneil Dolin, MD   650 mg at 09/03/18 0042   Or  . acetaminophen (TYLENOL) suppository 650 mg  650 mg Rectal Q6H PRN Daneil Dolin, MD      . albuterol (PROVENTIL) (2.5 MG/3ML) 0.083% nebulizer solution 3 mL  3 mL Inhalation Q4H PRN Daneil Dolin, MD      . ALPRAZolam Duanne Moron) tablet 1 mg  1 mg Oral TID PRN Daneil Dolin, MD   1 mg at 09/13/18 1353  . aspirin EC tablet 81 mg  81 mg Oral Daily Daneil Dolin, MD   81 mg at 09/13/18 2080  . atorvastatin (LIPITOR) tablet 80 mg  80 mg Oral q morning - 10a Daneil Dolin, MD   80 mg at 09/13/18 0837  . bisacodyl (DULCOLAX) EC tablet 10 mg  10 mg Oral Daily PRN Daneil Dolin, MD   10 mg at 09/06/18 1121  . cholecalciferol (VITAMIN D3) tablet 5,000 Units  5,000 Units Oral Daily Daneil Dolin, MD   5,000 Units at 09/13/18 [phone]  . gabapentin (NEURONTIN) capsule 300 mg  300 mg Oral QID Daneil Dolin, MD   300 mg at 09/13/18  1353  . Gerhardt's butt cream 1 application  1 application Topical TID Daneil Dolin, MD   1 application at 61/22/44 [phone]  . insulin aspart (novoLOG) injection 0-15 Units  0-15 Units Subcutaneous TID WC Daneil Dolin, MD   2 Units at 09/13/18 1217  . insulin aspart (novoLOG) injection 0-5 Units  0-5 Units Subcutaneous QHS Daneil Dolin, MD   3 Units at 09/10/18 2204  . insulin aspart (novoLOG) injection 5 Units  5 Units Subcutaneous TID WC Daneil Dolin, MD   5 Units at 09/13/18 1217  . insulin detemir (LEVEMIR) injection 20 Units  20 Units Subcutaneous QHS Daneil Dolin, MD   20 Units at 09/12/18 2237  . levothyroxine (SYNTHROID) tablet 200 mcg  200 mcg Oral QAC breakfast Daneil Dolin, MD   200 mcg at 09/13/18 [phone]  . nitroGLYCERIN (NITROSTAT) SL tablet 0.4 mg  0.4 mg Sublingual Q5 min PRN Rourk, Cristopher Estimable, MD      . nystatin (MYCOSTATIN/NYSTOP) topical powder   Topical BID Daneil Dolin, MD      . omega-3 acid ethyl esters (LOVAZA) capsule 1 g  1 g Oral Daily Daneil Dolin, MD   1 g at 09/13/18 0837  . ondansetron (ZOFRAN) tablet 8 mg  8 mg Oral Q8H PRN Daneil Dolin, MD   8 mg at 09/11/18 0913  . oxyCODONE-acetaminophen (PERCOCET/ROXICET) 5-325 MG per tablet 1 tablet  1 tablet Oral Q6H PRN Daneil Dolin, MD   1 tablet at 09/13/18 3875   And  . oxyCODONE (Oxy IR/ROXICODONE) immediate release tablet 5 mg  5 mg Oral Q6H PRN Daneil Dolin, MD   5 mg at 09/13/18 6433  . pantoprazole (PROTONIX) EC tablet 40 mg  40 mg Oral BID Daneil Dolin, MD   40 mg at 09/13/18 2951  . polyethylene glycol (MIRALAX / GLYCOLAX) packet 17 g  17 g Oral BID Daneil Dolin, MD   17 g at 09/13/18 0835  . senna (SENOKOT) tablet 17.2 mg  2 tablet Oral BID WC Daneil Dolin, MD   17.2 mg at 09/13/18 8841  . sodium chloride flush (NS) 0.9 % injection 3 mL  3 mL Intravenous Q12H Daneil Dolin, MD   3 mL at 09/13/18 0839  . sodium chloride flush (NS) 0.9 % injection 3 mL  3 mL Intravenous PRN Rourk,  Cristopher Estimable, MD      . tiZANidine (ZANAFLEX) tablet 2 mg  2 mg Oral Q8H PRN Daneil Dolin, MD   2 mg at 09/11/18 0915  . topiramate (TOPAMAX) tablet 150 mg  150 mg Oral QPM Daneil Dolin, MD   150 mg at 09/12/18 1654  . umeclidinium-vilanterol (ANORO ELLIPTA) 62.5-25 MCG/INH 1 puff  1 puff Inhalation Daily Daneil Dolin, MD   1 puff at 09/13/18 0849    VITAL SIGNS: BP 122/77 (BP Location: Left Arm)   Pulse 80   Temp 98.8 F (37.1 C) (Oral)   Resp (!) 21   Ht _0  (1.676 m)   Wt (!) 138 kg   SpO2 98%   BMI 49.10 kg/m  Filed Weights   09/11/18 0706 09/12/18 0100 09/13/18 0629  Weight: (!) 137.1 kg (!) 138.9 kg (!) 138 kg    Estimated body mass index is 49.1 kg/m as calculated from the following:   Height as of this encounter: _1  (1.676 m).   Weight as of this encounter: 138 kg.  LABS: CBC:    Component Value Date/Time   WBC 8.0 09/12/2018 0458   HGB 7.7 (L) 09/12/2018 0458   HCT 26.9 (L) 09/12/2018 0458   HCT 31.0 (L) 02/01/2017 0528   PLT 267 09/12/2018 0458   Comprehensive Metabolic Panel:    Component Value Date/Time   NA 135 09/13/2018 0554   K 4.4 09/13/2018 0554   CO2 28 09/13/2018 0554   BUN 35 (H) 09/13/2018 0554   BUN 28 (A) 11/11/2016   CREATININE 1.44 (H) 09/13/2018 0554   ALBUMIN 3.3 (L) 09/09/2018 0425     Review of Systems  Constitutional: Positive  for fatigue.  Respiratory: Positive for shortness of breath.   Cardiovascular: Positive for leg swelling.  Neurological: Positive for weakness.  All other systems reviewed and are negative.   Physical Exam Vitals signs and nursing note reviewed.  Constitutional:      General: She is awake.     Appearance: She is morbidly obese.  Cardiovascular:     Rate and Rhythm: Normal rate and regular rhythm.     Pulses: Normal pulses.     Heart sounds: Normal heart sounds.  Pulmonary:     Effort: Pulmonary effort is normal.     Breath sounds: Decreased breath sounds present.  Abdominal:      General: Bowel sounds are normal.     Palpations: Abdomen is soft.  Musculoskeletal:     Right lower leg: Edema present.     Left lower leg: Edema present.  Skin:    General: Skin is warm and dry.  Neurological:     Mental Status: She is alert and oriented to person, place, and time.  Psychiatric:        Attention and Perception: Attention normal.        Mood and Affect: Mood is anxious.        Speech: Speech normal.        Behavior: Behavior is cooperative.        Thought Content: Thought content normal.        Cognition and Memory: Cognition normal.        Judgment: Judgment normal.    Prognosis: Guarded to poor in the setting of perihilar lung mass with lymphadenopathy, lower GI bleed with colonic mass, CKD stage III, GERD, morbid obesity, CAD, anxiety, diabetes, hypertension, hypothyroidism, and diastolic CHF.   Discharge Planning:  To Be Determined  Recommendations:  DNR/DNI-as confirmed by patient  Continue to treat with aggressive measures. Patient remains hopeful for treatment options and some improvement. States she is anxious to be seen by Oncology and have a better understanding of her diagnosis and options. Is willing to attempt chemo, surgery, and any other medical interventions as recommended by providers.   Open to outpatient palliative once discharged.   PMT will continue to follow and support as needed.    Palliative Performance Scale: PPS 50%              Patient expressed understanding and was in agreement with this plan.   Thank you for allowing the Palliative Medicine Team to assist in the care of this patient.  Time In: 1430 Time Out: 1545 Time Total: 75 min.   Visit consisted of counseling and education dealing with the complex and emotionally intense issues of symptom management and palliative care in the setting of serious and potentially life-threatening illness.Greater than 50%  of this time was spent counseling and coordinating care related to  the above assessment and plan.  Signed by:  Alda Lea, AGPCNP-BC Palliative Medicine Team  Phone: [phone] Fax: [phone] Pager: [phone] Amion: Bjorn Pippin

## 2018-09-13 NOTE — TOC Progression Note (Signed)
Transition of Care [facility]) - Progression Note    Patient Details  Name: Stephanie Combs MRN: [id number] Date of Birth: 1950-09-22  Transition of Care [facility] CM/SW Contact  Sherrilyn Rist Phone Number: [phone] 09/13/2018, 9:14 AM  Clinical Narrative:    Patient transferred to [facility]; [place] arranged with [facility]. CM will continue to follow for progression of care   Expected Discharge Plan: [place] Barriers to Discharge: No Barriers Identified  Expected Discharge Plan and Services Expected Discharge Plan: [place] arrangements for the past 2 months: [place] Agency: [place]         Social Determinants of Health (SDOH) Interventions    Readmission Risk Interventions Readmission Risk Prevention Plan 09/02/2018 08/31/2018  Transportation Screening - Complete  PCP or Specialist appointment within 3-5 days of discharge Complete -  [place] or [place] - Complete  SW Recovery Care/Counseling Consult - Complete  [place] - Not Applicable  Some recent data might be hidden

## 2018-09-13 NOTE — Progress Notes (Addendum)
4 Days Post-Op  Subjective: Patient upset this morning and reports she is anxious. Some SOB. No abdominal pain, N/V/D/C. Tolerating diet. Normal BM without blood or melena yesterday night and this AM per patient. Passing flatus.   Objective: Vital signs in last 24 hours: Temp:  [98.1 F (36.7 C)-98.7 F (37.1 C)] 98.6 F (37 C) (05/04 8502) Pulse Rate:  [59-90] 90 (05/04 0849) Resp:  [18] 18 (05/04 0849) BP: (109-119)/(39-66) 109/39 (05/04 0834) SpO2:  [96 %-99 %] 96 % (05/04 0849) FiO2 (%):  [28 %] 28 % (05/04 0849) Weight:  [774 kg] 138 kg (05/04 0629) Last BM Date: 09/12/18  Intake/Output from previous day: 05/03 0701 - 05/04 0700 In: 1770.3 [P.O.:1302; I.V.:468.3] Out: 2101 [Urine:2100; Stool:1] Intake/Output this shift: Total I/O In: 360 [P.O.:360] Out: -   PE: Gen: Awake and alert, anxious. OOB in chair  Heart: Regular Lungs: Distant breath sounds. On 2L o2. Cannot talk in complete sentences without pausing 2/2 SOB  Abd: Obese, soft, ND, NT. +BS. Prior umbilical surgical scar.  Msk: B/l LE edema   Lab Results:  Recent Labs    09/12/18 0458  WBC 8.0  HGB 7.7*  HCT 26.9*  PLT 267   BMET Recent Labs    09/12/18 0458 09/13/18 0554  NA 135 135  K 4.3 4.4  CL 98 99  CO2 30 28  GLUCOSE 130* 197*  BUN 29* 35*  CREATININE 1.29* 1.44*  CALCIUM 8.6* 8.4*   PT/INR No results for input(s): LABPROT, INR in the last 72 hours. CMP     Component Value Date/Time   NA 135 09/13/2018 0554   K 4.4 09/13/2018 0554   CL 99 09/13/2018 0554   CO2 28 09/13/2018 0554   GLUCOSE 197 (H) 09/13/2018 0554   BUN 35 (H) 09/13/2018 0554   BUN 28 (A) 11/11/2016   CREATININE 1.44 (H) 09/13/2018 0554   CALCIUM 8.4 (L) 09/13/2018 0554   PROT 5.8 (L) 09/02/2018 0500   ALBUMIN 3.3 (L) 09/09/2018 0425   AST 12 (L) 09/02/2018 0500   ALT 10 09/02/2018 0500   ALKPHOS 97 09/02/2018 0500   BILITOT 0.6 09/02/2018 0500   GFRNONAA 37 (L) 09/13/2018 0554   GFRAA 43 (L)  09/13/2018 0554   Lipase     Component Value Date/Time   LIPASE 21 04/05/2018 1629       Studies/Results: Ct Chest W Contrast  Result Date: 09/11/2018 CLINICAL DATA:  68 year old female inpatient admitted with symptomatic anemia and decompensated heart failure, found to have hepatic flexure colon mass on colonoscopy 09/09/2018. staging evaluation. EXAM: CT CHEST, ABDOMEN, AND PELVIS WITH CONTRAST TECHNIQUE: Multidetector CT imaging of the chest, abdomen and pelvis was performed following the standard protocol during bolus administration of intravenous contrast. CONTRAST:  185mL OMNIPAQUE IOHEXOL 300 MG/ML  SOLN COMPARISON:  08/30/2018 chest radiograph. 01/27/2017 chest CT angiogram. 04/05/2018 CT abdomen/pelvis. FINDINGS: CT CHEST FINDINGS Cardiovascular: Borderline mild cardiomegaly. No significant pericardial effusion/thickening. Left anterior descending coronary atherosclerosis. Atherosclerotic nonaneurysmal thoracic aorta. Dilated main pulmonary artery (3.5 cm diameter). No central pulmonary emboli. Mediastinum/Nodes: No discrete thyroid nodules. Unremarkable esophagus. No axillary adenopathy. Multiple enlarged left prevascular/AP window lymph nodes, largest 1.6 cm (series 3/image 18), new since 01/27/2017 chest CT. New enlarged 2.1 cm subcarinal node (series 3/image 23). No right hilar adenopathy. Lungs/Pleura: No pneumothorax. Trace dependent left pleural effusion. No right pleural effusion. Central perihilar left upper lobe 5.4 x 4.2 cm lung mass (series 3/image 27), new since 01/27/2017 chest CT, with  associated high-grade stenosis of the left upper lobe bronchus. Postobstructive consolidation in lingula with some associated volume loss. No significant pulmonary nodules. Mild passive atelectasis in dependent lower lobes. Musculoskeletal: No aggressive appearing focal osseous lesions. Mild thoracic spondylosis. CT ABDOMEN PELVIS FINDINGS Hepatobiliary: Normal liver size. No liver mass. Normal  gallbladder with no radiopaque cholelithiasis. No biliary ductal dilatation. Pancreas: Normal, with no mass or duct dilation. Spleen: Normal size. No mass. Adrenals/Urinary Tract: Left adrenal 2.4 cm nodule with density 14 HU, stable in size since 04/30/2015 CT, compatible with a benign adenoma. Stable appearance of the right adrenal gland without discrete right adrenal nodule. Punctate nonobstructing interpolar right and upper left renal stones. No hydronephrosis. Subcentimeter hypodense renal cortical lesion in the lower left kidney is too small to characterize and is unchanged, considered benign. No additional renal lesions. Normal bladder. Stomach/Bowel: Small hiatal hernia. Otherwise normal nondistended stomach. Normal caliber small bowel with no small bowel wall thickening. Appendix not discretely visualized. No pericecal inflammatory changes. Oral contrast transits to the right colon. There is an apple-core lesion in the ascending colon measuring 4.0 x 3.9 cm (series 5/image 60) with irregular annular wall thickening. No additional sites of large bowel wall thickening. No significant colonic diverticulosis. No acute pericolonic fat stranding. Moderate diffuse colonic stool volume. Vascular/Lymphatic: Atherosclerotic nonaneurysmal abdominal aorta. Patent portal, splenic, hepatic and renal veins. Top-normal size 0.6 cm right mesenteric node (series 3/image 73). No pathologically enlarged lymph nodes in the abdomen or pelvis. Reproductive: Grossly normal uterus.  No adnexal mass. Other: No pneumoperitoneum, ascites or focal fluid collection. Musculoskeletal: No aggressive appearing focal osseous lesions. Marked lumbar spondylosis. IMPRESSION: 1. Central perihilar left upper lobe 5.4 cm lung mass, new since 01/27/2017 chest CT, compatible with malignancy, favor primary bronchogenic carcinoma. Postobstructive pneumonia/atelectasis in the lingula. 2. Mediastinal adenopathy in the left prevascular/AP window and  subcarinal stations, compatible with metastatic nodes (more likely originating from the left upper lobe lung mass). 3. Trace dependent left pleural effusion. 4. Apple-core lesion in the ascending colon, compatible with known primary colonic neoplasm. Adjacent top-normal size right mesenteric lymph node is equivocal for locoregional nodal metastasis. No additional potential findings of metastatic disease in the abdomen or pelvis. 5. Chronic findings include: Aortic Atherosclerosis (ICD10-I70.0). Borderline mild cardiomegaly. One vessel coronary atherosclerosis. Dilated main pulmonary artery, suggesting pulmonary arterial hypertension. Left adrenal adenoma. Punctate bilateral nephrolithiasis. Small hiatal hernia. Electronically Signed   By: Ilona Sorrel M.D.   On: 09/11/2018 16:42   Ct Abdomen Pelvis W Contrast  Result Date: 09/11/2018 CLINICAL DATA:  68 year old female inpatient admitted with symptomatic anemia and decompensated heart failure, found to have hepatic flexure colon mass on colonoscopy 09/09/2018. staging evaluation. EXAM: CT CHEST, ABDOMEN, AND PELVIS WITH CONTRAST TECHNIQUE: Multidetector CT imaging of the chest, abdomen and pelvis was performed following the standard protocol during bolus administration of intravenous contrast. CONTRAST:  156mL OMNIPAQUE IOHEXOL 300 MG/ML  SOLN COMPARISON:  08/30/2018 chest radiograph. 01/27/2017 chest CT angiogram. 04/05/2018 CT abdomen/pelvis. FINDINGS: CT CHEST FINDINGS Cardiovascular: Borderline mild cardiomegaly. No significant pericardial effusion/thickening. Left anterior descending coronary atherosclerosis. Atherosclerotic nonaneurysmal thoracic aorta. Dilated main pulmonary artery (3.5 cm diameter). No central pulmonary emboli. Mediastinum/Nodes: No discrete thyroid nodules. Unremarkable esophagus. No axillary adenopathy. Multiple enlarged left prevascular/AP window lymph nodes, largest 1.6 cm (series 3/image 18), new since 01/27/2017 chest CT. New  enlarged 2.1 cm subcarinal node (series 3/image 23). No right hilar adenopathy. Lungs/Pleura: No pneumothorax. Trace dependent left pleural effusion. No right pleural effusion. Central perihilar left  upper lobe 5.4 x 4.2 cm lung mass (series 3/image 27), new since 01/27/2017 chest CT, with associated high-grade stenosis of the left upper lobe bronchus. Postobstructive consolidation in lingula with some associated volume loss. No significant pulmonary nodules. Mild passive atelectasis in dependent lower lobes. Musculoskeletal: No aggressive appearing focal osseous lesions. Mild thoracic spondylosis. CT ABDOMEN PELVIS FINDINGS Hepatobiliary: Normal liver size. No liver mass. Normal gallbladder with no radiopaque cholelithiasis. No biliary ductal dilatation. Pancreas: Normal, with no mass or duct dilation. Spleen: Normal size. No mass. Adrenals/Urinary Tract: Left adrenal 2.4 cm nodule with density 14 HU, stable in size since 04/30/2015 CT, compatible with a benign adenoma. Stable appearance of the right adrenal gland without discrete right adrenal nodule. Punctate nonobstructing interpolar right and upper left renal stones. No hydronephrosis. Subcentimeter hypodense renal cortical lesion in the lower left kidney is too small to characterize and is unchanged, considered benign. No additional renal lesions. Normal bladder. Stomach/Bowel: Small hiatal hernia. Otherwise normal nondistended stomach. Normal caliber small bowel with no small bowel wall thickening. Appendix not discretely visualized. No pericecal inflammatory changes. Oral contrast transits to the right colon. There is an apple-core lesion in the ascending colon measuring 4.0 x 3.9 cm (series 5/image 60) with irregular annular wall thickening. No additional sites of large bowel wall thickening. No significant colonic diverticulosis. No acute pericolonic fat stranding. Moderate diffuse colonic stool volume. Vascular/Lymphatic: Atherosclerotic nonaneurysmal  abdominal aorta. Patent portal, splenic, hepatic and renal veins. Top-normal size 0.6 cm right mesenteric node (series 3/image 73). No pathologically enlarged lymph nodes in the abdomen or pelvis. Reproductive: Grossly normal uterus.  No adnexal mass. Other: No pneumoperitoneum, ascites or focal fluid collection. Musculoskeletal: No aggressive appearing focal osseous lesions. Marked lumbar spondylosis. IMPRESSION: 1. Central perihilar left upper lobe 5.4 cm lung mass, new since 01/27/2017 chest CT, compatible with malignancy, favor primary bronchogenic carcinoma. Postobstructive pneumonia/atelectasis in the lingula. 2. Mediastinal adenopathy in the left prevascular/AP window and subcarinal stations, compatible with metastatic nodes (more likely originating from the left upper lobe lung mass). 3. Trace dependent left pleural effusion. 4. Apple-core lesion in the ascending colon, compatible with known primary colonic neoplasm. Adjacent top-normal size right mesenteric lymph node is equivocal for locoregional nodal metastasis. No additional potential findings of metastatic disease in the abdomen or pelvis. 5. Chronic findings include: Aortic Atherosclerosis (ICD10-I70.0). Borderline mild cardiomegaly. One vessel coronary atherosclerosis. Dilated main pulmonary artery, suggesting pulmonary arterial hypertension. Left adrenal adenoma. Punctate bilateral nephrolithiasis. Small hiatal hernia. Electronically Signed   By: Ilona Sorrel M.D.   On: 09/11/2018 16:42    Anti-infectives: Anti-infectives (From admission, onward)   Start     Dose/Rate Route Frequency Ordered Stop   09/11/18 1300  cephALEXin (KEFLEX) capsule 500 mg  Status:  Discontinued     500 mg Oral Every 6 hours 09/11/18 1253 09/12/18 1308   09/07/18 1615  ceFAZolin (ANCEF) IVPB 1 g/50 mL premix  Status:  Discontinued     1 g 100 mL/hr over 30 Minutes Intravenous Every 8 hours 09/07/18 1605 09/11/18 1253   09/05/18 1715  cefTRIAXone (ROCEPHIN) 2 g in  sodium chloride 0.9 % 100 mL IVPB  Status:  Discontinued     2 g 200 mL/hr over 30 Minutes Intravenous Every 24 hours 09/05/18 1705 09/06/18 1757   09/05/18 1645  cefTRIAXone (ROCEPHIN) 1 g in sodium chloride 0.9 % 100 mL IVPB  Status:  Discontinued     1 g 200 mL/hr over 30 Minutes Intravenous Every 24 hours 09/05/18  1634 09/05/18 1705       Assessment/Plan COPD on 2L home O2 DM CHF admitted for fluid overload CAD s/p stent 1/19 HTN Lymphedema  Anemia - Hgb 7.7 yesterday   Right Colon Mass - Colonoscopy at [facility] on 4/30 with colon mass near the hepatic flexure, which was biopsied and tattooed.  - Pathology pending - CEA 2.1 - CT chest shows left upper lobe 5.4 cm mass concerning for malignancy. Recommend medical oncology consultation for further evaluation and workup - Also recommend  - Poor pulmonary function and cardiac risk factors; Cardiology note at [facility] from 5/1 places patient at 11% chance of major adverse cardiac events..  -No evidence of complete obstruction. No immediate plans for surgery - needs evaluation by medical oncology., Also recommend palliative care consult.   FEN - HH VTE - SCD, okay for chemical prophylaxis from a surgical standpoint ID - none currently  POC: Daughter Marlyne Beards, pt asked that I do not call her    LOS: 13 days    Jillyn Ledger , [facility] 09/13/2018, 9:24 AM Pager: [phone]

## 2018-09-13 NOTE — Consult Note (Addendum)
NAME:  Stephanie Combs, MRN:  [id number], DOB:  May 31, 1950, LOS: 23 ADMISSION DATE:  08/30/2018, CONSULTATION DATE:  5/4 REFERRING MD:  arrien, CHIEF COMPLAINT:  Lung mass   Brief History   68 year old female patient who was transferred to [facility] from [facility] on 5/2 for surgical evaluation of colonic mass.  During staging evaluation was found also have left perihilar lung mass with mediastinal adenopathy by CT scan on 5/2 because of this pulmonary consulted History of present illness   68 year old female who was admitted initially w/ 5d h/o worsening shortness of breath and LE swelling. This was refractory to oral diuretic titration so presented to ER. ED eval: L lung effusion/atx, hgb 7. She was admitted w/ working dx of decompensated diastolic HF, lower extremity cellulitis and symptomatic anemia.  She was transfused 2 units of blood, aggressively diuresed, 14 L off since admission.  Ultimately had GI consultation due to heme positive stool and underwent EGD and colonoscopy on April 30 which demonstrated colonic mass.  A biopsy was obtained.  A CT of chest was obtained at the direction of general surgery consultation on 5/2 this demonstrated 5 cm mass in the left perihilar region for which pulmonary was consulted. -Lives independently -At baseline can ambulate about 70 feet prior to dyspnea -Stop smoking about 4 and half years ago -Retired used to be a Museum/gallery conservator -does have famil;y h/o lung cancer Past Medical History  CAD, chronic diastolic HF, obesity, CKD III, HTN, chronic lymphedema, hypothyroidism  Significant Hospital Events   4/20 admitted with decompensated heart failure, symptomatic anemia.  Has fused and aggressively diuresed. 4/28: GI consulted for heme positive stool 4/30: EGD and colonoscopy: Showed colonic mass biopsies taken mass tattooed 5/2: Transferred to [facility] seen by general surgery CT chest ordered for staging purposes 5/4: Pulmonary  asked to see due to left perihilar mass.  Feeling better from pulmonary standpoint, has diuresed 15.7 L Consults:  GI medicine at [facility] General surgery Pulmonary  Procedures:  EGD and colonoscopy 4/30  Significant Diagnostic Tests:  CT chest and abdomen 5/2: Central perihilar left upper lobe lung mass 5.4 cm in measurement mediastinal adenopathy in the left prevascular AP window and subcarinal station apple core lesion of the ascending colon compatible with known colonic mass  Micro Data:    Antimicrobials:     Interim history/subjective:  Feels better  Objective   Blood pressure 122/77, pulse 80, temperature 98.8 F (37.1 C), temperature source Oral, resp. rate (Abnormal) 21, height 5\' 6"  (1.676 m), weight (Abnormal) 138 kg, SpO2 98 %.    FiO2 (%):  [28 %] 28 %   Intake/Output Summary (Last 24 hours) at 09/13/2018 1449 Last data filed at 09/13/2018 1215 Gross per 24 hour  Intake 1992.25 ml  Output 1701 ml  Net 291.25 ml   Filed Weights   09/11/18 0706 09/12/18 0100 09/13/18 0629  Weight: (Abnormal) 137.1 kg (Abnormal) 138.9 kg (Abnormal) 138 kg    Examination: General: Obese 68 year old female resting in chair no acute distress HENT: Normocephalic atraumatic no jugular venous distention Lungs: Clear diminished bases Cardiovascular: Regular rate and rhythm Abdomen: Obese nontender Extremities: Chronic lower extremity changes edema improved per patient strong pulses warm to palpation Neuro: Awake oriented GU: Voids spontaneously  Resolved Hospital Problem list   Lower extremity cellulitis  Assessment & Plan:  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  Hemoglobin trending down once again -Seen by surgery and felt not a good surgical candidate Plan Transfusion ordered by primary service  follow-up pathology Needs oncology  consult  Morbid obesity Plan Pulse oximetry Careful postoperative observation given risk for postoperative respiratory failure  GERD w/ upper airway irritation Plan PPI BID  CKD stage III. Serum creatinine elevated some, looks like baseline about 1.18 now up to 1.44 Plan Careful observation with ongoing diuresis, this may need to be back down Continue strict INO Renal dose as appropriate  Acute on chronic diastolic heart failure Clinically nearing baseline Plan Continuing diuresis and blood pressure control  Hypothyroidism Plan Continue replacement  Diabetes Plan Sliding scale insulin   Best practice:  Diet: npo after midnight  Pain/Anxiety/Delirium protocol (if indicated): na VAP protocol (if indicated): na DVT prophylaxis: none due to bleeding GI prophylaxis: ppi Glucose control: ssi Mobility: oob Code Status: full code  Family Communication: pending  Disposition: for EBUS   Labs   CBC: Recent Labs  Lab 09/07/18 0557 09/08/18 0613 09/09/18 0425 09/12/18 0458  WBC 7.1 8.7 8.2 8.0  HGB 7.8* 8.5* 8.6* 7.7*  HCT 27.1* 29.8* 29.8* 26.9*  MCV 86.3 87.1 87.1 86.8  PLT 284 318 303 809    Basic Metabolic Panel: Recent Labs  Lab 09/08/18 0613 09/09/18 0425 09/11/18 1352 09/12/18 0458 09/13/18 0554  NA 135 138 133* 135 135  K 4.7 4.1 4.4 4.3 4.4  CL 98 100 96* 98 99  CO2 29 28 27 30 28   GLUCOSE 224* 143* 219* 130* 197*  BUN 49* 43* 31* 29* 35*  CREATININE 1.28* 1.18* 1.38* 1.29* 1.44*  CALCIUM 8.8* 8.8* 8.5* 8.6* 8.4*  PHOS  --  4.1  --   --   --    GFR: Estimated Creatinine Clearance: 53.6 mL/min (A) (by C-G formula based on SCr of 1.44 mg/dL (H)). Recent Labs  Lab 09/07/18 0557 09/08/18 0613 09/09/18 0425 09/12/18 0458  WBC 7.1 8.7 8.2 8.0    Liver Function Tests: Recent Labs  Lab 09/09/18 0425  ALBUMIN 3.3*   No results for input(s): LIPASE, AMYLASE in the last 168 hours. No results for input(s): AMMONIA in the last 168 hours.   ABG    Component Value Date/Time   PHART 7.325 (L) 01/08/2018 2115   PCO2ART 48.4 (H) 01/08/2018 2115   PO2ART 109.0 (H) 01/08/2018 2115   HCO3 23.6 01/08/2018 2115   TCO2 31 05/19/2017 1602   TCO2 30 05/19/2017 1602   ACIDBASEDEF 0.7 01/08/2018 2115   O2SAT 97.9 01/08/2018 2115     Coagulation Profile: No results for input(s): INR, PROTIME in the last 168 hours.  Cardiac Enzymes: No results for input(s): CKTOTAL, CKMB, CKMBINDEX, TROPONINI in the last 168 hours.  HbA1C: Hemoglobin A1C  Date/Time Value Ref Range Status  11/11/2016 7.5  Final  08/09/2016 9.6  Final   Hgb A1c MFr Bld  Date/Time Value Ref Range Status  08/30/2018 09:15 PM 7.5 (H) 4.8 - 5.6 % Final    Comment:    (NOTE)         Prediabetes: 5.7 - 6.4         Diabetes: >6.4         Glycemic control for adults with diabetes: <7.0   07/05/2018 11:38 AM 7.9 (H) 4.8 - 5.6 % Final    Comment:    (NOTE) Pre diabetes:          5.7%-6.4% Diabetes:              >  6.4% Glycemic control for   <7.0% adults with diabetes     CBG: Recent Labs  Lab 09/12/18 1043 09/12/18 1609 09/12/18 2127 09/13/18 0634 09/13/18 1109  GLUCAP 117* 182* 188* 184* 144*    Review of Systems:   Review of Systems  Constitutional: Positive for chills and malaise/fatigue. Negative for fever and weight loss.  HENT: Negative.   Eyes: Negative.   Respiratory: Positive for shortness of breath and wheezing. Negative for cough, hemoptysis and sputum production.   Cardiovascular: Positive for chest pain, orthopnea and leg swelling.  Gastrointestinal: Positive for abdominal pain and heartburn.  Genitourinary: Negative.   Musculoskeletal: Negative.   Skin: Negative.   Neurological: Negative.   Endo/Heme/Allergies: Negative.   Psychiatric/Behavioral: Negative.      Past Medical History  She,  has a past medical history of Anxiety, CAD (coronary artery disease), CHF (congestive heart failure) ([place]), Chronic back pain, Chronic pain,  COPD (chronic obstructive pulmonary disease) ([place]), Degenerative disk disease, Diabetes mellitus without complication ([place]), History of medication noncompliance (11/2017), Hypertension, Hypothyroidism, On home O2, and Pedal edema.   Surgical History    Past Surgical History:  Procedure Laterality Date  . BIOPSY  09/09/2018   Procedure: BIOPSY;  Surgeon: Daneil Dolin, MD;  [facility];  Service: Endoscopy;;  gastric esophageal hepatic flexure colon  . COLONOSCOPY WITH PROPOFOL N/A 09/09/2018   Procedure: COLONOSCOPY WITH PROPOFOL;  Surgeon: Daneil Dolin, MD;  Location: [facility];  Service: Endoscopy;  Laterality: N/A;  . CORONARY STENT INTERVENTION N/A 05/22/2017   Procedure: CORONARY STENT INTERVENTION;  Surgeon: Martinique, Aizik Reh M, MD;  Location: [facility];  Service: Cardiovascular;  Laterality: N/A;  . ESOPHAGOGASTRODUODENOSCOPY (EGD) WITH PROPOFOL N/A 09/09/2018   Procedure: ESOPHAGOGASTRODUODENOSCOPY (EGD) WITH PROPOFOL;  Surgeon: Daneil Dolin, MD;  [facility]: [facility];  Service: Endoscopy;  Laterality: N/A;  . HERNIA REPAIR    . POLYPECTOMY  09/09/2018   Procedure: POLYPECTOMY;  Surgeon: Daneil Dolin, MD;  [facility]: [facility];  Service: Endoscopy;;  colon  . RIGHT/LEFT HEART CATH AND CORONARY ANGIOGRAPHY N/A 05/19/2017   Procedure: RIGHT/LEFT HEART CATH AND CORONARY ANGIOGRAPHY;  Surgeon: Leonie Man, MD;  Location: [facility];  Service: Cardiovascular;  Laterality: N/A;  . TUBAL LIGATION       Social History   reports that she quit smoking about 3 years ago. Her smoking use included cigarettes. She smoked 0.25 packs per day. She has never used smokeless tobacco. She reports that she does not drink alcohol or use drugs.   Family History   Her family history includes Cerebral palsy in her brother; Diabetes in her brother and another family member; Heart attack in her father and another family member; Pneumonia in her brother; Stroke in  her mother. There is no history of Colon cancer.   Allergies Allergies  Allergen Reactions  . Dilaudid [Hydromorphone Hcl] Itching  . Midodrine Hcl Swelling    After one dose had anaphylactic reaction, had to call EMS.  Marland Kitchen Actifed Cold-Allergy [Chlorpheniramine-Phenylephrine]     "I was sick and red and it didn't agree with me at all"  . Doxycycline Nausea And Vomiting  . Other Cough    Pt states she is allergic to ragweed and that she starts coughing and sneezing like crazy  . Penicillins Hives and Itching    Tolerates Rocephin Has patient had a PCN reaction causing immediate rash, facial/tongue/throat swelling, SOB or lightheadedness with hypotension: Yes Has  patient had a PCN reaction causing severe rash involving mucus membranes or skin necrosis: No Has patient had a PCN reaction that required hospitalization Yes Has patient had a PCN reaction occurring within the last 10 years: No If all of the above answers are "NO", then may proceed with Cephalosporin use.   . Reglan [Metoclopramide] Itching  . Valium [Diazepam] Itching  . Vistaril [Hydroxyzine Hcl] Itching     Home Medications  Prior to Admission medications   Medication Sig Start Date End Date Taking? Authorizing Provider  albuterol (PROAIR HFA) 108 (90 Base) MCG/ACT inhaler Inhale 2 puffs into the lungs every 4 (four) hours as needed. For shortness of breath 04/09/18  Yes Johnson, Clanford L, MD  albuterol (PROVENTIL) (2.5 MG/3ML) 0.083% nebulizer solution Take 2.5 mg by nebulization 2 (two) times daily as needed for wheezing or shortness of breath.    Yes [provider]  ALPRAZolam Duanne Moron) 1 MG tablet Take 1 mg by mouth 3 (three) times daily as needed for anxiety or sleep.  01/05/17  Yes [provider]  aspirin EC 81 MG tablet Take 81 mg by mouth daily.   Yes [provider]  atorvastatin (LIPITOR) 80 MG tablet Take 1 tablet (80 mg total) by mouth daily at 6 PM. Patient taking differently: Take  80 mg by mouth every morning.  05/26/17  Yes [name], MD  Cholecalciferol (VITAMIN D3) 5000 units CAPS Take 1 capsule (5,000 Units total) by mouth daily. 08/19/16  Yes Nida, Marella Chimes, MD  docusate sodium (COLACE) 100 MG capsule Take 300 mg by mouth daily.   Yes [provider]  ferrous sulfate 325 (65 FE) MG tablet Take 1 tablet (325 mg total) by mouth daily with breakfast. 02/05/17  Yes [name], MD  furosemide (LASIX) 20 MG tablet Take 40mg  po in the morning and 20mg  in the evening Patient taking differently: Take 20-40 mg by mouth See admin instructions. Take 40mg  po in the morning and 20mg  in the evening 08/25/18  Yes Strader, [name] M, PA-C  gabapentin (NEURONTIN) 600 MG tablet Take 600 mg by mouth 4 (four) times daily.   Yes [provider]  Hydrocortisone (GERHARDT'S BUTT CREAM) CREA Apply 1 application topically 3 (three) times daily. 05/26/17  Yes [name], MD  insulin aspart (NOVOLOG) 100 UNIT/ML injection Inject 1-18 Units into the skin 3 (three) times daily with meals. Patient taking differently: Inject 1-20 Units into the skin 3 (three) times daily with meals.  04/09/18  Yes Johnson, Clanford L, MD  insulin detemir (LEVEMIR) 100 UNIT/ML injection Inject 0.4 mLs (40 Units total) into the skin at bedtime. 11/26/17  Yes Tat, Shanon Brow, MD  levothyroxine (SYNTHROID, LEVOTHROID) 200 MCG tablet Take 1 tablet by mouth daily. 06/24/18  Yes [provider]  nitroGLYCERIN (NITROSTAT) 0.4 MG SL tablet Place 0.4 mg under the tongue every 5 (five) minutes as needed for chest pain.  03/26/18  Yes [provider]  nystatin (MYCOSTATIN/NYSTOP) powder Apply topically 2 (two) times daily. 10/10/16  Yes Eber Jones, MD  Omega-3 Fatty Acids (FISH OIL PO) Take 1 capsule by mouth daily.    Yes [provider]  ondansetron (ZOFRAN) 8 MG tablet Take 8 mg by mouth every 8 (eight) hours as needed for nausea or vomiting.  08/11/18  Yes [provider]   oxyCODONE-acetaminophen (PERCOCET) 10-325 MG tablet Take 1 tablet by mouth every 6 (six) hours as needed for pain.   Yes [provider]  OXYGEN Inhale 2  L into the lungs continuous.   Yes [provider]  pantoprazole (PROTONIX) 40 MG tablet Take 1 tablet (40 mg total) by mouth 2 (two) times daily. 08/25/18  Yes Strader, [name] M, PA-C  potassium chloride (MICRO-K) 10 MEQ CR capsule Take 2 capsules (20 mEq total) by mouth 2 (two) times daily. *May take one additional tablet as needed for cramping 04/23/18  Yes Herminio Commons, MD  tiZANidine (ZANAFLEX) 4 MG tablet Take 1 tablet (4 mg total) by mouth every 8 (eight) hours as needed for muscle spasms. 07/08/18  Yes Kathie Dike, MD  topiramate (TOPAMAX) 50 MG tablet Take 150 mg by mouth every evening.  03/18/18  Yes [provider]  umeclidinium-vilanterol (ANORO ELLIPTA) 62.5-25 MCG/INH AEPB Inhale 1 puff into the lungs daily. Patient taking differently: Inhale 1 puff into the lungs daily as needed (for shortness of breath).  05/26/17  Yes [name], MD  SURE COMFORT INS SYR 1CC/28G 28G X 1/2" 1 ML MISC USE TO INJECT INSULIN UP TO 4 TIMES DAILY. 03/06/17   Cassandria Anger, MD     Erick Colace ACNP-BC [place] Pager # [phone] OR # [phone] if no answer

## 2018-09-13 NOTE — Progress Notes (Signed)
Progress Note  Patient Name: Stephanie Combs Date of Encounter: 09/13/2018  Primary Cardiologist: Kate Sable, MD   Subjective   68 yo female, hx of CAD , stenting of LAD Jan. 2019. Transferred from [facility] for further evaluation of profound anemia.  We are asked to see her for cardiac clearance for presumed colon surgery.  The patient has been found to have an apple core lesion in her colon.  She is also been found to have hilar mass suspicious of lung cancer.  She is not having any episodes of angina. She is been diuresed.  She is net -13.62 L so far.  Inpatient Medications    Scheduled Meds:  aspirin EC  81 mg Oral Daily   atorvastatin  80 mg Oral q morning - 10a   cholecalciferol  5,000 Units Oral Daily   gabapentin  300 mg Oral QID   Gerhardt's butt cream  1 application Topical TID   insulin aspart  0-15 Units Subcutaneous TID WC   insulin aspart  0-5 Units Subcutaneous QHS   insulin aspart  5 Units Subcutaneous TID WC   insulin detemir  20 Units Subcutaneous QHS   levothyroxine  200 mcg Oral QAC breakfast   nystatin   Topical BID   omega-3 acid ethyl esters  1 g Oral Daily   pantoprazole  40 mg Oral BID   polyethylene glycol  17 g Oral BID   senna  2 tablet Oral BID WC   sodium chloride flush  3 mL Intravenous Q12H   topiramate  150 mg Oral QPM   umeclidinium-vilanterol  1 puff Inhalation Daily   Continuous Infusions:  sodium chloride Stopped (09/11/18 1716)   PRN Meds: sodium chloride, acetaminophen **OR** acetaminophen, albuterol, ALPRAZolam, bisacodyl, nitroGLYCERIN, ondansetron, oxyCODONE-acetaminophen **AND** oxyCODONE, sodium chloride flush, tiZANidine   Vital Signs    Vitals:   09/13/18 0637 09/13/18 0834 09/13/18 0849 09/13/18 1107  BP: 115/66 (!) 109/39  122/77  Pulse: 75 73 90 80  Resp: 18  18 (!) 21  Temp: 98.6 F (37 C)   98.8 F (37.1 C)  TempSrc: Oral   Oral  SpO2: 98% 97% 96% 98%  Weight:      Height:         Intake/Output Summary (Last 24 hours) at 09/13/2018 1250 Last data filed at 09/13/2018 1215 Gross per 24 hour  Intake 1992.25 ml  Output 1701 ml  Net 291.25 ml   Last 3 Weights 09/13/2018 09/12/2018 09/11/2018  Weight (lbs) 304 lb 3.8 oz 306 lb 3.2 oz 302 lb 3.2 oz  Weight (kg) 138 kg 138.891 kg 137.077 kg      Telemetry     - Personally Reviewed  ECG     - Personally Reviewed  Physical Exam   GEN: morbidly obese, depressed,  In NAD  Neck: No JVD Cardiac: RRR, no murmurs, rubs, or gallops.  Respiratory: Clear to auscultation bilaterally. GI: Soft, nontender, non-distended  MS:    Legs are very large ( lymphedema )  Neuro:  Nonfocal  Psych: Normal affect   Labs    Chemistry Recent Labs  Lab 09/09/18 0425 09/11/18 1352 09/12/18 0458 09/13/18 0554  NA 138 133* 135 135  K 4.1 4.4 4.3 4.4  CL 100 96* 98 99  CO2 28 27 30 28   GLUCOSE 143* 219* 130* 197*  BUN 43* 31* 29* 35*  CREATININE 1.18* 1.38* 1.29* 1.44*  CALCIUM 8.8* 8.5* 8.6* 8.4*  ALBUMIN 3.3*  --   --   --  GFRNONAA 47* 39* 43* 37*  GFRAA 55* 45* 49* 43*  ANIONGAP 10 10 7 8      Hematology Recent Labs  Lab 09/08/18 0613 09/09/18 0425 09/12/18 0458  WBC 8.7 8.2 8.0  RBC 3.42* 3.42* 3.10*  HGB 8.5* 8.6* 7.7*  HCT 29.8* 29.8* 26.9*  MCV 87.1 87.1 86.8  MCH 24.9* 25.1* 24.8*  MCHC 28.5* 28.9* 28.6*  RDW 19.8* 19.9* 20.9*  PLT 318 303 267    Cardiac EnzymesNo results for input(s): TROPONINI in the last 168 hours. No results for input(s): TROPIPOC in the last 168 hours.   BNPNo results for input(s): BNP, PROBNP in the last 168 hours.   DDimer No results for input(s): DDIMER in the last 168 hours.   Radiology    Ct Chest W Contrast  Result Date: 09/11/2018 CLINICAL DATA:  68 year old female inpatient admitted with symptomatic anemia and decompensated heart failure, found to have hepatic flexure colon mass on colonoscopy 09/09/2018. staging evaluation. EXAM: CT CHEST, ABDOMEN, AND PELVIS WITH  CONTRAST TECHNIQUE: Multidetector CT imaging of the chest, abdomen and pelvis was performed following the standard protocol during bolus administration of intravenous contrast. CONTRAST:  18mL OMNIPAQUE IOHEXOL 300 MG/ML  SOLN COMPARISON:  08/30/2018 chest radiograph. 01/27/2017 chest CT angiogram. 04/05/2018 CT abdomen/pelvis. FINDINGS: CT CHEST FINDINGS Cardiovascular: Borderline mild cardiomegaly. No significant pericardial effusion/thickening. Left anterior descending coronary atherosclerosis. Atherosclerotic nonaneurysmal thoracic aorta. Dilated main pulmonary artery (3.5 cm diameter). No central pulmonary emboli. Mediastinum/Nodes: No discrete thyroid nodules. Unremarkable esophagus. No axillary adenopathy. Multiple enlarged left prevascular/AP window lymph nodes, largest 1.6 cm (series 3/image 18), new since 01/27/2017 chest CT. New enlarged 2.1 cm subcarinal node (series 3/image 23). No right hilar adenopathy. Lungs/Pleura: No pneumothorax. Trace dependent left pleural effusion. No right pleural effusion. Central perihilar left upper lobe 5.4 x 4.2 cm lung mass (series 3/image 27), new since 01/27/2017 chest CT, with associated high-grade stenosis of the left upper lobe bronchus. Postobstructive consolidation in lingula with some associated volume loss. No significant pulmonary nodules. Mild passive atelectasis in dependent lower lobes. Musculoskeletal: No aggressive appearing focal osseous lesions. Mild thoracic spondylosis. CT ABDOMEN PELVIS FINDINGS Hepatobiliary: Normal liver size. No liver mass. Normal gallbladder with no radiopaque cholelithiasis. No biliary ductal dilatation. Pancreas: Normal, with no mass or duct dilation. Spleen: Normal size. No mass. Adrenals/Urinary Tract: Left adrenal 2.4 cm nodule with density 14 HU, stable in size since 04/30/2015 CT, compatible with a benign adenoma. Stable appearance of the right adrenal gland without discrete right adrenal nodule. Punctate nonobstructing  interpolar right and upper left renal stones. No hydronephrosis. Subcentimeter hypodense renal cortical lesion in the lower left kidney is too small to characterize and is unchanged, considered benign. No additional renal lesions. Normal bladder. Stomach/Bowel: Small hiatal hernia. Otherwise normal nondistended stomach. Normal caliber small bowel with no small bowel wall thickening. Appendix not discretely visualized. No pericecal inflammatory changes. Oral contrast transits to the right colon. There is an apple-core lesion in the ascending colon measuring 4.0 x 3.9 cm (series 5/image 60) with irregular annular wall thickening. No additional sites of large bowel wall thickening. No significant colonic diverticulosis. No acute pericolonic fat stranding. Moderate diffuse colonic stool volume. Vascular/Lymphatic: Atherosclerotic nonaneurysmal abdominal aorta. Patent portal, splenic, hepatic and renal veins. Top-normal size 0.6 cm right mesenteric node (series 3/image 73). No pathologically enlarged lymph nodes in the abdomen or pelvis. Reproductive: Grossly normal uterus.  No adnexal mass. Other: No pneumoperitoneum, ascites or focal fluid collection. Musculoskeletal: No aggressive appearing focal osseous  lesions. Marked lumbar spondylosis. IMPRESSION: 1. Central perihilar left upper lobe 5.4 cm lung mass, new since 01/27/2017 chest CT, compatible with malignancy, favor primary bronchogenic carcinoma. Postobstructive pneumonia/atelectasis in the lingula. 2. Mediastinal adenopathy in the left prevascular/AP window and subcarinal stations, compatible with metastatic nodes (more likely originating from the left upper lobe lung mass). 3. Trace dependent left pleural effusion. 4. Apple-core lesion in the ascending colon, compatible with known primary colonic neoplasm. Adjacent top-normal size right mesenteric lymph node is equivocal for locoregional nodal metastasis. No additional potential findings of metastatic disease in  the abdomen or pelvis. 5. Chronic findings include: Aortic Atherosclerosis (ICD10-I70.0). Borderline mild cardiomegaly. One vessel coronary atherosclerosis. Dilated main pulmonary artery, suggesting pulmonary arterial hypertension. Left adrenal adenoma. Punctate bilateral nephrolithiasis. Small hiatal hernia. Electronically Signed   By: Ilona Sorrel M.D.   On: 09/11/2018 16:42   Ct Abdomen Pelvis W Contrast  Result Date: 09/11/2018 CLINICAL DATA:  68 year old female inpatient admitted with symptomatic anemia and decompensated heart failure, found to have hepatic flexure colon mass on colonoscopy 09/09/2018. staging evaluation. EXAM: CT CHEST, ABDOMEN, AND PELVIS WITH CONTRAST TECHNIQUE: Multidetector CT imaging of the chest, abdomen and pelvis was performed following the standard protocol during bolus administration of intravenous contrast. CONTRAST:  11mL OMNIPAQUE IOHEXOL 300 MG/ML  SOLN COMPARISON:  08/30/2018 chest radiograph. 01/27/2017 chest CT angiogram. 04/05/2018 CT abdomen/pelvis. FINDINGS: CT CHEST FINDINGS Cardiovascular: Borderline mild cardiomegaly. No significant pericardial effusion/thickening. Left anterior descending coronary atherosclerosis. Atherosclerotic nonaneurysmal thoracic aorta. Dilated main pulmonary artery (3.5 cm diameter). No central pulmonary emboli. Mediastinum/Nodes: No discrete thyroid nodules. Unremarkable esophagus. No axillary adenopathy. Multiple enlarged left prevascular/AP window lymph nodes, largest 1.6 cm (series 3/image 18), new since 01/27/2017 chest CT. New enlarged 2.1 cm subcarinal node (series 3/image 23). No right hilar adenopathy. Lungs/Pleura: No pneumothorax. Trace dependent left pleural effusion. No right pleural effusion. Central perihilar left upper lobe 5.4 x 4.2 cm lung mass (series 3/image 27), new since 01/27/2017 chest CT, with associated high-grade stenosis of the left upper lobe bronchus. Postobstructive consolidation in lingula with some associated  volume loss. No significant pulmonary nodules. Mild passive atelectasis in dependent lower lobes. Musculoskeletal: No aggressive appearing focal osseous lesions. Mild thoracic spondylosis. CT ABDOMEN PELVIS FINDINGS Hepatobiliary: Normal liver size. No liver mass. Normal gallbladder with no radiopaque cholelithiasis. No biliary ductal dilatation. Pancreas: Normal, with no mass or duct dilation. Spleen: Normal size. No mass. Adrenals/Urinary Tract: Left adrenal 2.4 cm nodule with density 14 HU, stable in size since 04/30/2015 CT, compatible with a benign adenoma. Stable appearance of the right adrenal gland without discrete right adrenal nodule. Punctate nonobstructing interpolar right and upper left renal stones. No hydronephrosis. Subcentimeter hypodense renal cortical lesion in the lower left kidney is too small to characterize and is unchanged, considered benign. No additional renal lesions. Normal bladder. Stomach/Bowel: Small hiatal hernia. Otherwise normal nondistended stomach. Normal caliber small bowel with no small bowel wall thickening. Appendix not discretely visualized. No pericecal inflammatory changes. Oral contrast transits to the right colon. There is an apple-core lesion in the ascending colon measuring 4.0 x 3.9 cm (series 5/image 60) with irregular annular wall thickening. No additional sites of large bowel wall thickening. No significant colonic diverticulosis. No acute pericolonic fat stranding. Moderate diffuse colonic stool volume. Vascular/Lymphatic: Atherosclerotic nonaneurysmal abdominal aorta. Patent portal, splenic, hepatic and renal veins. Top-normal size 0.6 cm right mesenteric node (series 3/image 73). No pathologically enlarged lymph nodes in the abdomen or pelvis. Reproductive: Grossly normal uterus.  No  adnexal mass. Other: No pneumoperitoneum, ascites or focal fluid collection. Musculoskeletal: No aggressive appearing focal osseous lesions. Marked lumbar spondylosis. IMPRESSION: 1.  Central perihilar left upper lobe 5.4 cm lung mass, new since 01/27/2017 chest CT, compatible with malignancy, favor primary bronchogenic carcinoma. Postobstructive pneumonia/atelectasis in the lingula. 2. Mediastinal adenopathy in the left prevascular/AP window and subcarinal stations, compatible with metastatic nodes (more likely originating from the left upper lobe lung mass). 3. Trace dependent left pleural effusion. 4. Apple-core lesion in the ascending colon, compatible with known primary colonic neoplasm. Adjacent top-normal size right mesenteric lymph node is equivocal for locoregional nodal metastasis. No additional potential findings of metastatic disease in the abdomen or pelvis. 5. Chronic findings include: Aortic Atherosclerosis (ICD10-I70.0). Borderline mild cardiomegaly. One vessel coronary atherosclerosis. Dilated main pulmonary artery, suggesting pulmonary arterial hypertension. Left adrenal adenoma. Punctate bilateral nephrolithiasis. Small hiatal hernia. Electronically Signed   By: Ilona Sorrel M.D.   On: 09/11/2018 16:42    Cardiac Studies     Patient Profile     68 y.o. female  With hx of CAD , acute diastolic CHF Found to have colon cancer and now has been found to have a lung mass - likely a lung cancer    Assessment & Plan     1.  CAD :   Stable ,  No angina .  Continue ASA for now   2  Acute on chronic diastolic CHF :  Is breathing better.   Continue diuresis.   3.   Colon cancer, lung cancer:   Oncology consult pending         For questions or updates, please contact [place] Please consult [email or web address] for contact info under        Signed, Mertie Moores, MD  09/13/2018, 12:50 PM

## 2018-09-13 NOTE — Progress Notes (Signed)
PROGRESS NOTE    YU CRAGUN  RKY:[id number] DOB: 05-30-1950 DOA: 08/30/2018 PCP: [name], MD    Brief Narrative:  68 year old female who presented with dyspnea and lower extremity edema. Does have significant past medical history for coronary artery disease, diastolic heart failure, type 2 diabetes mellitus, chronic kidney disease stage III, hypertension, hypothyroidism and chronic lymphedema. She reported 5 days of worsening dyspnea along with lower extremity edema and dry cough. Symptoms were refractory to increased dose of oral furosemide. On her initial physical examination she was afebrile, pulse rate 84, respirate 24, blood pressure 130/100, oxygenation 100% on supplemental oxygen. Her lungs are clear to auscultation bilaterally, heart S1-S2 present, rhythmic, no gallops, rubs or murmurs, the abdomen was protuberant, nontender, no lower extremity edema. Sodium 138, potassium 4.2, chloride 102, bicarb 27, glucose 241, BUN 23, creatinine 1.58, AST 13, ALT 11, iron 9, TIBC 431, transferrin saturation 2, ferritin 4, white count 7.7, hemoglobin 7.0, hematocrit 35.0, platelets 377.  Patient was admitted to the hospital with a working diagnosis of symptomatic anemia and decompensated heart failure.  Patient received 2 units packed red blood cells with improvement of her hemoglobin and hematocrit.  Patient was successfully diuresed with IV furosemide, with improvement of her symptoms.  Further work-up with colonoscopy April 30 showed a colonic mass. [facility].    Assessment & Plan:   Principal Problem:   Anemia Active Problems:   DM type 2 (diabetes mellitus, type 2) (HCC)   HTN (hypertension), benign   CKD (chronic kidney disease), stage III (HCC)   Hypothyroidism   Bilateral lower extremity edema   Uncontrolled type 2 diabetes mellitus with stage 3 chronic kidney disease (HCC)   Obesity, Class III, BMI  40-49.9 (morbid obesity) (HCC)   CAD (coronary artery disease)   Symptomatic anemia   Acute on chronic diastolic CHF (congestive heart failure) (HCC)   Heme positive stool   Non-intractable vomiting   Abdominal pain, epigastric   Pain of lower extremity   Colonic mass   Colon polyps   Cellulitis of leg, left   1. Symptomatic iron deficiency anemia due to colonic mass/ near the hepatic flexure. Severe iron deficiency (serum iron at 9.0, TIBC at 413, ferritin 4 and transferrin saturation of 2). Patient has received 2 units prbc transfusion and IV iron infusion x1. I called pathology and her colon biopsy still pending. Patient is not candidate for elective abdominal surgery, due to high risk of complications. Her diet has been advanced with good toleration. Pending oncology consult for now.   2. New left hialar mass 5,4 cm. Possible malignancy. Patient has no significant symptoms. Possible second primary or metastasis from colon mass. Pending oncology recommendations.   2. Decompensated diastolic heart failure, acute on chronic.EF 60 to 65% with moderately increased LV wall thickness. About 15 liters negative fluid balance. Noted worsening renal function. Possible intravascular depleted, will hold furosemide for now. Continue blood pressure monitoring.   3.Left leg cellulitis. Clinically has resolved, antibiotics have been discontinued.   4. Morbid obesity and dyslipidemia. Her BMI is 48.7. Continue statin therapy.    5. T2DM. Her fasting glucose this am is 197. On basal insulin with 20 units daily, 5 units pre-meal, plus sliding scale insulin, with good toleration. Diet has been advanced.   6. Hypothyroid. Continue with levothyroxine.   7. AKI on CKD stage 3. Serum Cr today at 1,44, with K at 4,4 and serum bicarbonate at 28. Challenging  volume assessment due to obesity, will hold on furosemide for now.   DVT prophylaxis: heparin   Code Status:  full Family Communication: no  family at the bedside  Disposition Plan/ discharge barriers: pending oncology evaluation.   Body mass index is 49.1 kg/m. Malnutrition Type:      Malnutrition Characteristics:      Nutrition Interventions:     RN Pressure Injury Documentation:     Consultants:   Surgery   Procedures:     Antimicrobials:       Subjective: Patient reports improvement on her left lower extremity pain, no nausea or vomiting, no dyspnea or chest pain. Has been tolerating po well.   Objective: Vitals:   09/13/18 0637 09/13/18 0834 09/13/18 0849 09/13/18 1107  BP: 115/66 (!) 109/39  122/77  Pulse: 75 73 90 80  Resp: 18  18 (!) 21  Temp: 98.6 F (37 C)   98.8 F (37.1 C)  TempSrc: Oral   Oral  SpO2: 98% 97% 96% 98%  Weight:      Height:        Intake/Output Summary (Last 24 hours) at 09/13/2018 1208 Last data filed at 09/13/2018 1696 Gross per 24 hour  Intake 1770.25 ml  Output 1701 ml  Net 69.25 ml   Filed Weights   09/11/18 0706 09/12/18 0100 09/13/18 0629  Weight: (!) 137.1 kg (!) 138.9 kg (!) 138 kg    Examination:   General: deconditioned Neurology: Awake and alert, non focal  E ENT: mild pallor, no icterus, oral mucosa moist Cardiovascular: No JVD. S1-S2 present, rhythmic, no gallops, rubs, or murmurs. ++++ non pitting lower extremity edema. Pulmonary: positive breath sounds bilaterally, adequate air movement, no wheezing, rhonchi or rales. Limited examination.  Gastrointestinal. Abdomen protuberant no organomegaly, non tender, no rebound or guarding Skin. No rashes Musculoskeletal: no joint deformities     Data Reviewed: I have personally reviewed following labs and imaging studies  CBC: Recent Labs  Lab 09/07/18 0557 09/08/18 0613 09/09/18 0425 09/12/18 0458  WBC 7.1 8.7 8.2 8.0  HGB 7.8* 8.5* 8.6* 7.7*  HCT 27.1* 29.8* 29.8* 26.9*  MCV 86.3 87.1 87.1 86.8  PLT 284 318 303 789   Basic Metabolic Panel: Recent Labs  Lab 09/08/18 0613 09/09/18  0425 09/11/18 1352 09/12/18 0458 09/13/18 0554  NA 135 138 133* 135 135  K 4.7 4.1 4.4 4.3 4.4  CL 98 100 96* 98 99  CO2 29 28 27 30 28   GLUCOSE 224* 143* 219* 130* 197*  BUN 49* 43* 31* 29* 35*  CREATININE 1.28* 1.18* 1.38* 1.29* 1.44*  CALCIUM 8.8* 8.8* 8.5* 8.6* 8.4*  PHOS  --  4.1  --   --   --    GFR: Estimated Creatinine Clearance: 53.6 mL/min (A) (by C-G formula based on SCr of 1.44 mg/dL (H)). Liver Function Tests: Recent Labs  Lab 09/09/18 0425  ALBUMIN 3.3*   No results for input(s): LIPASE, AMYLASE in the last 168 hours. No results for input(s): AMMONIA in the last 168 hours. Coagulation Profile: No results for input(s): INR, PROTIME in the last 168 hours. Cardiac Enzymes: No results for input(s): CKTOTAL, CKMB, CKMBINDEX, TROPONINI in the last 168 hours. BNP (last 3 results) No results for input(s): PROBNP in the last 8760 hours. HbA1C: No results for input(s): HGBA1C in the last 72 hours. CBG: Recent Labs  Lab 09/12/18 1043 09/12/18 1609 09/12/18 2127 09/13/18 0634 09/13/18 1109  GLUCAP 117* 182* 188* 184* 144*   Lipid Profile:  No results for input(s): CHOL, HDL, LDLCALC, TRIG, CHOLHDL, LDLDIRECT in the last 72 hours. Thyroid Function Tests: No results for input(s): TSH, T4TOTAL, FREET4, T3FREE, THYROIDAB in the last 72 hours. Anemia Panel: No results for input(s): VITAMINB12, FOLATE, FERRITIN, TIBC, IRON, RETICCTPCT in the last 72 hours.    Radiology Studies: I have reviewed all of the imaging during this hospital visit personally     Scheduled Meds: . aspirin EC  81 mg Oral Daily  . atorvastatin  80 mg Oral q morning - 10a  . cholecalciferol  5,000 Units Oral Daily  . gabapentin  300 mg Oral QID  . Gerhardt's butt cream  1 application Topical TID  . insulin aspart  0-15 Units Subcutaneous TID WC  . insulin aspart  0-5 Units Subcutaneous QHS  . insulin aspart  5 Units Subcutaneous TID WC  . insulin detemir  20 Units Subcutaneous QHS  .  levothyroxine  200 mcg Oral QAC breakfast  . nystatin   Topical BID  . omega-3 acid ethyl esters  1 g Oral Daily  . pantoprazole  40 mg Oral BID  . polyethylene glycol  17 g Oral BID  . senna  2 tablet Oral BID WC  . sodium chloride flush  3 mL Intravenous Q12H  . topiramate  150 mg Oral QPM  . umeclidinium-vilanterol  1 puff Inhalation Daily   Continuous Infusions: . sodium chloride Stopped (09/11/18 1716)     LOS: 13 days        Beauden Tremont Gerome Apley, MD

## 2018-09-14 ENCOUNTER — Encounter (HOSPITAL_COMMUNITY)
Admission: EM | Disposition: A | Discharge status: Home Health | Payer: Self-pay | Source: Home / Self Care | Attending: Internal Medicine

## 2018-09-14 ENCOUNTER — Inpatient Hospital Stay (HOSPITAL_COMMUNITY): Payer: Medicare Other | Admitting: Certified Registered"

## 2018-09-14 ENCOUNTER — Encounter (HOSPITAL_COMMUNITY): Payer: Self-pay

## 2018-09-14 HISTORY — PX: VIDEO BRONCHOSCOPY WITH ENDOBRONCHIAL ULTRASOUND: SHX6177

## 2018-09-14 LAB — BASIC METABOLIC PANEL
Anion gap: 11 (ref 5–15)
BUN: 42 mg/dL — ABNORMAL HIGH (ref 8–23)
CO2: 29 mmol/L (ref 22–32)
Calcium: 8.9 mg/dL (ref 8.9–10.3)
Chloride: 96 mmol/L — ABNORMAL LOW (ref 98–111)
Creatinine, Ser: 1.43 mg/dL — ABNORMAL HIGH (ref 0.44–1.00)
GFR calc Af Amer: 44 mL/min — ABNORMAL LOW (ref >60)
GFR calc non Af Amer: 38 mL/min — ABNORMAL LOW (ref >60)
Glucose, Bld: 188 mg/dL — ABNORMAL HIGH (ref 70–99)
Potassium: 3.9 mmol/L (ref 3.5–5.1)
Sodium: 136 mmol/L (ref 135–145)

## 2018-09-14 LAB — BODY FLUID CELL COUNT WITH DIFFERENTIAL
Eos, Fluid: 4 %
Lymphs, Fluid: 21 %
Monocyte-Macrophage-Serous Fluid: 18 % — ABNORMAL LOW (ref 50–90)
Neutrophil Count, Fluid: 57 % — ABNORMAL HIGH (ref 0–25)
Total Nucleated Cell Count, Fluid: 84 cu mm (ref 0–1000)

## 2018-09-14 LAB — GLUCOSE, CAPILLARY
Glucose-Capillary: 164 mg/dL — ABNORMAL HIGH (ref 70–99)
Glucose-Capillary: 163 mg/dL — ABNORMAL HIGH (ref 70–99)
Glucose-Capillary: 148 mg/dL — ABNORMAL HIGH (ref 70–99)
Glucose-Capillary: 181 mg/dL — ABNORMAL HIGH (ref 70–99)
Glucose-Capillary: 307 mg/dL — ABNORMAL HIGH (ref 70–99)

## 2018-09-14 SURGERY — BRONCHOSCOPY, WITH EBUS
Anesthesia: General | Site: Chest | Laterality: Left

## 2018-09-14 MED ORDER — FENTANYL CITRATE (PF) 100 MCG/2ML IJ SOLN
25.0000 ug | INTRAMUSCULAR | Status: DC | PRN
  Administered 2018-09-14: 25 ug via INTRAVENOUS

## 2018-09-14 MED ORDER — SUCCINYLCHOLINE CHLORIDE 200 MG/10ML IV SOSY
PREFILLED_SYRINGE | INTRAVENOUS | Status: DC | PRN
  Administered 2018-09-14: 120 mg via INTRAVENOUS

## 2018-09-14 MED ORDER — SUGAMMADEX SODIUM 200 MG/2ML IV SOLN
INTRAVENOUS | Status: DC | PRN
  Administered 2018-09-14: 300 mg via INTRAVENOUS

## 2018-09-14 MED ORDER — PROMETHAZINE HCL 25 MG/ML IJ SOLN: 6.2500 mg | INTRAMUSCULAR | Status: DC | PRN

## 2018-09-14 MED ORDER — ONDANSETRON HCL 4 MG/2ML IJ SOLN
INTRAMUSCULAR | Status: DC | PRN
  Administered 2018-09-14: 4 mg via INTRAVENOUS

## 2018-09-14 MED ORDER — EPINEPHRINE HCL (NASAL) 0.1 % NA SOLN
NASAL | Status: DC | PRN
  Administered 2018-09-14: 1 mL via TOPICAL

## 2018-09-14 MED ORDER — DEXAMETHASONE SODIUM PHOSPHATE 10 MG/ML IJ SOLN
INTRAMUSCULAR | Status: AC
  Filled 2018-09-14: qty 1

## 2018-09-14 MED ORDER — PHENYLEPHRINE HCL (PRESSORS) 10 MG/ML IV SOLN
INTRAVENOUS | Status: DC | PRN
  Administered 2018-09-14: 40 ug via INTRAVENOUS
  Administered 2018-09-14: 80 ug via INTRAVENOUS

## 2018-09-14 MED ORDER — LACTATED RINGERS IV SOLN
INTRAVENOUS | Status: DC
  Administered 2018-09-14 (×2): via INTRAVENOUS

## 2018-09-14 MED ORDER — FENTANYL CITRATE (PF) 250 MCG/5ML IJ SOLN
INTRAMUSCULAR | Status: DC | PRN
  Administered 2018-09-14 (×2): 50 ug via INTRAVENOUS

## 2018-09-14 MED ORDER — EPINEPHRINE PF 1 MG/ML IJ SOLN
INTRAMUSCULAR | Status: AC
  Filled 2018-09-14: qty 1

## 2018-09-14 MED ORDER — ROCURONIUM BROMIDE 50 MG/5ML IV SOSY
PREFILLED_SYRINGE | INTRAVENOUS | Status: DC | PRN
  Administered 2018-09-14: 10 mg via INTRAVENOUS
  Administered 2018-09-14: 50 mg via INTRAVENOUS
  Administered 2018-09-14: 10 mg via INTRAVENOUS

## 2018-09-14 MED ORDER — 0.9 % SODIUM CHLORIDE (POUR BTL) OPTIME
TOPICAL | Status: DC | PRN
  Administered 2018-09-14: 13:00:00 1000 mL

## 2018-09-14 MED ORDER — LIDOCAINE 2% (20 MG/ML) 5 ML SYRINGE
INTRAMUSCULAR | Status: DC | PRN
  Administered 2018-09-14: 100 mg via INTRAVENOUS

## 2018-09-14 MED ORDER — LIDOCAINE 2% (20 MG/ML) 5 ML SYRINGE
INTRAMUSCULAR | Status: AC
  Filled 2018-09-14: qty 5

## 2018-09-14 MED ORDER — DEXAMETHASONE SODIUM PHOSPHATE 10 MG/ML IJ SOLN
INTRAMUSCULAR | Status: DC | PRN
  Administered 2018-09-14: 5 mg via INTRAVENOUS

## 2018-09-14 MED ORDER — MIDAZOLAM HCL 2 MG/2ML IJ SOLN
INTRAMUSCULAR | Status: DC | PRN
  Administered 2018-09-14 (×2): 1 mg via INTRAVENOUS

## 2018-09-14 MED ORDER — MIDAZOLAM HCL 2 MG/2ML IJ SOLN
INTRAMUSCULAR | Status: AC
  Filled 2018-09-14: qty 2

## 2018-09-14 MED ORDER — FENTANYL CITRATE (PF) 250 MCG/5ML IJ SOLN
INTRAMUSCULAR | Status: AC
  Filled 2018-09-14: qty 5

## 2018-09-14 MED ORDER — MEPERIDINE HCL 25 MG/ML IJ SOLN: 6.2500 mg | INTRAMUSCULAR | Status: DC | PRN

## 2018-09-14 MED ORDER — PROPOFOL 10 MG/ML IV BOLUS
INTRAVENOUS | Status: DC | PRN
  Administered 2018-09-14: 150 mg via INTRAVENOUS

## 2018-09-14 MED ORDER — FENTANYL CITRATE (PF) 100 MCG/2ML IJ SOLN
INTRAMUSCULAR | Status: AC
  Filled 2018-09-14: qty 2

## 2018-09-14 MED ORDER — ROCURONIUM BROMIDE 10 MG/ML (PF) SYRINGE
PREFILLED_SYRINGE | INTRAVENOUS | Status: AC
  Filled 2018-09-14: qty 10

## 2018-09-14 MED ORDER — FUROSEMIDE 40 MG PO TABS
40.0000 mg | ORAL_TABLET | Freq: Every day | ORAL | Status: DC
  Administered 2018-09-15 – 2018-09-17 (×3): 40 mg via ORAL
  Filled 2018-09-14 (×3): qty 1

## 2018-09-14 MED ORDER — ONDANSETRON HCL 4 MG/2ML IJ SOLN
INTRAMUSCULAR | Status: AC
  Filled 2018-09-14: qty 2

## 2018-09-14 MED ORDER — PHENYLEPHRINE 40 MCG/ML (10ML) SYRINGE FOR IV PUSH (FOR BLOOD PRESSURE SUPPORT)
PREFILLED_SYRINGE | INTRAVENOUS | Status: AC
  Filled 2018-09-14: qty 10

## 2018-09-14 SURGICAL SUPPLY — 37 items
ADAPTER VALVE BIOPSY EBUS (MISCELLANEOUS) IMPLANT
ADPTR VALVE BIOPSY EBUS (MISCELLANEOUS)
BALLN FOR EBUS SCOPE (BALLOONS) ×2
BALLOON FOR EBUS SCOPE (BALLOONS) ×1 IMPLANT
BALN ESCP STRL LXBF-UC160F (BALLOONS) ×1
BRUSH CYTOL CELLEBRITY 1.5X140 (MISCELLANEOUS) ×1 IMPLANT
CANISTER SUCT 3000ML PPV (MISCELLANEOUS) ×2 IMPLANT
CONT SPEC 4OZ CLIKSEAL STRL BL (MISCELLANEOUS) ×3 IMPLANT
COVER BACK TABLE 60X90IN (DRAPES) ×2 IMPLANT
FORCEPS BIOP RJ4 1.8 (CUTTING FORCEPS) ×1 IMPLANT
GAUZE SPONGE 4X4 12PLY STRL (GAUZE/BANDAGES/DRESSINGS) ×2 IMPLANT
GLOVE BIO SURGEON STRL SZ 6.5 (GLOVE) ×2 IMPLANT
GOWN STRL REUS W/ TWL LRG LVL3 (GOWN DISPOSABLE) ×1 IMPLANT
GOWN STRL REUS W/TWL LRG LVL3 (GOWN DISPOSABLE) ×2
KIT CLEAN ENDO COMPLIANCE (KITS) ×4 IMPLANT
KIT TURNOVER KIT B (KITS) ×2 IMPLANT
MARKER SKIN DUAL TIP RULER LAB (MISCELLANEOUS) ×2 IMPLANT
NDL ASPIRATION VIZISHOT 19G (NEEDLE) IMPLANT
NDL ASPIRATION VIZISHOT 21G (NEEDLE) ×1 IMPLANT
NEEDLE ASPIRATION VIZISHOT 19G (NEEDLE) ×4 IMPLANT
NEEDLE ASPIRATION VIZISHOT 21G (NEEDLE) IMPLANT
NS IRRIG 1000ML POUR BTL (IV SOLUTION) ×2 IMPLANT
OIL SILICONE PENTAX (PARTS (SERVICE/REPAIRS)) ×2 IMPLANT
PAD ARMBOARD 7.5X6 YLW CONV (MISCELLANEOUS) ×4 IMPLANT
SOLUTION ANTI FOG 6CC (MISCELLANEOUS) ×1 IMPLANT
SYR 20CC LL (SYRINGE) ×4 IMPLANT
SYR 20ML ECCENTRIC (SYRINGE) ×4 IMPLANT
SYR 50ML SLIP (SYRINGE) IMPLANT
SYR 5ML LUER SLIP (SYRINGE) ×2 IMPLANT
TOWEL OR 17X24 6PK STRL BLUE (TOWEL DISPOSABLE) ×2 IMPLANT
TRAP SPECIMEN MUCOUS 40CC (MISCELLANEOUS) IMPLANT
TUBE CONNECTING 20X1/4 (TUBING) ×4 IMPLANT
UNDERPAD 30X30 (UNDERPADS AND DIAPERS) ×2 IMPLANT
VALVE BIOPSY  SINGLE USE (MISCELLANEOUS) ×1
VALVE BIOPSY SINGLE USE (MISCELLANEOUS) ×1 IMPLANT
VALVE SUCTION BRONCHIO DISP (MISCELLANEOUS) ×2 IMPLANT
WATER STERILE IRR 1000ML POUR (IV SOLUTION) ×2 IMPLANT

## 2018-09-14 NOTE — Progress Notes (Signed)
5 Days Post-Op  Subjective: CC: Right colon mass Patient to undergo bronchoscopy w/ EBUS today. Currently NPO. Tolerated diet yesterday. No N/V. Passing flatus. 3 NB/NM BM's yesterday that were soft.    Objective: Vital signs in last 24 hours: Temp:  [98.4 F (36.9 C)-98.8 F (37.1 C)] 98.5 F (36.9 C) (05/05 0507) Pulse Rate:  [68-90] 81 (05/05 0507) Resp:  [18-21] 18 (05/05 0507) BP: (111-133)/(47-77) 111/55 (05/05 0507) SpO2:  [96 %-100 %] 100 % (05/05 0507) FiO2 (%):  [28 %] 28 % (05/04 0849) Weight:  [137.9 kg] 137.9 kg (05/05 0507) Last BM Date: 09/13/18  Intake/Output from previous day: 05/04 0701 - 05/05 0700 In: 705 [P.O.:702; I.V.:3] Out: 700 [Urine:700] Intake/Output this shift: No intake/output data recorded.  PE: Gen: Awake and alert, anxious. OOB in chair  Heart: Regular Lungs: Distant breath sounds. On 2L o2. Pauses after sentences 2/2 SOB  Abd: Obese, soft, ND, NT. +BS. Prior umbilical surgical scar.  Msk: B/l LE edema   Lab Results:  Recent Labs    09/12/18 0458  WBC 8.0  HGB 7.7*  HCT 26.9*  PLT 267   BMET Recent Labs    09/13/18 0554 09/14/18 0354  NA 135 136  K 4.4 3.9  CL 99 96*  CO2 28 29  GLUCOSE 197* 188*  BUN 35* 42*  CREATININE 1.44* 1.43*  CALCIUM 8.4* 8.9   PT/INR No results for input(s): LABPROT, INR in the last 72 hours. CMP     Component Value Date/Time   NA 136 09/14/2018 0354   K 3.9 09/14/2018 0354   CL 96 (L) 09/14/2018 0354   CO2 29 09/14/2018 0354   GLUCOSE 188 (H) 09/14/2018 0354   BUN 42 (H) 09/14/2018 0354   BUN 28 (A) 11/11/2016   CREATININE 1.43 (H) 09/14/2018 0354   CALCIUM 8.9 09/14/2018 0354   PROT 5.8 (L) 09/02/2018 0500   ALBUMIN 3.3 (L) 09/09/2018 0425   AST 12 (L) 09/02/2018 0500   ALT 10 09/02/2018 0500   ALKPHOS 97 09/02/2018 0500   BILITOT 0.6 09/02/2018 0500   GFRNONAA 38 (L) 09/14/2018 0354   GFRAA 44 (L) 09/14/2018 0354   Lipase     Component Value Date/Time   LIPASE 21  04/05/2018 1629       Studies/Results: No results found.  Anti-infectives: Anti-infectives (From admission, onward)   Start     Dose/Rate Route Frequency Ordered Stop   09/11/18 1300  cephALEXin (KEFLEX) capsule 500 mg  Status:  Discontinued     500 mg Oral Every 6 hours 09/11/18 1253 09/12/18 1308   09/07/18 1615  ceFAZolin (ANCEF) IVPB 1 g/50 mL premix  Status:  Discontinued     1 g 100 mL/hr over 30 Minutes Intravenous Every 8 hours 09/07/18 1605 09/11/18 1253   09/05/18 1715  cefTRIAXone (ROCEPHIN) 2 g in sodium chloride 0.9 % 100 mL IVPB  Status:  Discontinued     2 g 200 mL/hr over 30 Minutes Intravenous Every 24 hours 09/05/18 1705 09/06/18 1757   09/05/18 1645  cefTRIAXone (ROCEPHIN) 1 g in sodium chloride 0.9 % 100 mL IVPB  Status:  Discontinued     1 g 200 mL/hr over 30 Minutes Intravenous Every 24 hours 09/05/18 1634 09/05/18 1705       Assessment/Plan COPD on 2L home O2 DM CHF admitted for fluid overload CAD s/p stent 1/19 HTN Lymphedema  Anemia - Hgb 7.7 5/3. VSS. CBC in AM.    Right Colon  Mass - Colonoscopy at [facility] on 4/30 with colon mass near the hepatic flexure, which was biopsied and tattooed.  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 Pt to undergo bronchoscopy w/ EBUS today by Pulm  - Poor pulmonary function and cardiac risk factors. ACS surgery risk calculator predicts MORTALITY OF 40% w/ partial colectomy. High likelihood she would not come off vent w/ underlying pulmonary function. Discussed this with her. Palliative care consult ordered and discussed w/ pt -No evidence of complete obstruction or indication for emergency surgery. Pt to undergo Bronchoscopy today.   FEN - NPO VTE - SCD, chemical prophylaxis on hold for bronchoscopy  ID - none currently  POC: Daughter Marlyne Beards    LOS: 14 days     Jillyn Ledger , [facility] Surgery 09/14/2018, 8:36 AM Pager: [phone]

## 2018-09-14 NOTE — Progress Notes (Signed)
Received pt post Bronchoscopy, still coughing out a little blood and complaining soreness of her throat. Will monitor accordingly.

## 2018-09-14 NOTE — Progress Notes (Signed)
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 Vic Ripper, pathologist, found adenocarcinoma in small splenic flexure polyp.  These were cold snared and felt to have been completely removed. 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  The more appropriate operation would be to extend a right hemicolectomy to include the splenic flexure in the resected specimen. Unfortunately, it appears that any bowel resection would be prohibitively high risk.  Agree with palliative care consultation. Please call if any questions regarding colonoscopy findings.

## 2018-09-14 NOTE — Interval H&P Note (Signed)
History and Physical Interval Note:  09/14/2018 11:53 AM  Stephanie Combs  has presented today for surgery, with the diagnosis of Left upper lobe lung.  The various methods of treatment have been discussed with the patient and family. After consideration of risks, benefits and other options for treatment, the patient has consented to  Procedure(s): Cedar (Left) as a surgical intervention.  The patient's history has been reviewed, patient examined, no change in status, stable for surgery.  I have reviewed the patient's chart and labs.  Questions were answered to the patient's satisfaction.     Chi Rodman Pickle

## 2018-09-14 NOTE — Progress Notes (Signed)
PROGRESS NOTE    Stephanie Combs  NLZ:[id number] DOB: 07-May-1951 DOA: 08/30/2018 PCP: [name], MD    Brief Narrative:  68 year old female who presented with dyspnea and lower extremity edema. Does have significant past medical history for coronary artery disease, diastolic heart failure, type 2 diabetes mellitus, chronic kidney disease stage III, hypertension, hypothyroidism and chronic lymphedema. She reported 5 days of worsening dyspnea along with lower extremity edema and dry cough. Symptoms were refractory to increased dose of oral furosemide. On her initial physical examination she was afebrile, pulse rate 84, respirate 24, blood pressure 130/100, oxygenation 100% on supplemental oxygen. Her lungs are clear to auscultation bilaterally, heart S1-S2 present, rhythmic, no gallops, rubs or murmurs, the abdomen was protuberant, nontender, no lower extremity edema. Sodium 138, potassium 4.2, chloride 102, bicarb 27, glucose 241, BUN 23, creatinine 1.58, AST 13, ALT 11, iron 9, TIBC 431, transferrin saturation 2, ferritin 4, white count 7.7, hemoglobin 7.0, hematocrit 35.0, platelets 377.  Patient was admitted to the hospital with a working diagnosis of symptomatic anemia and decompensated heart failure.  Patient received 2 units packed red blood cells with improvement of her hemoglobin and hematocrit.  Patient was successfully diuresed with IV furosemide, with improvement of her symptoms.  Further work-up with colonoscopy April 30 showed a colonic mass. [facility].  Patient was found to have a large left hilar mass, consulted pulmonary and patient underwent bronchoscopy today plus biopsy.    Assessment & Plan:   Principal Problem:   Anemia Active Problems:   DM type 2 (diabetes mellitus, type 2) (HCC)   HTN (hypertension), benign   CKD (chronic kidney disease), stage III (HCC)   Hypothyroidism   Bilateral lower  extremity edema   Uncontrolled type 2 diabetes mellitus with stage 3 chronic kidney disease (HCC)   Obesity, Class III, BMI 40-49.9 (morbid obesity) (HCC)   CAD (coronary artery disease)   Symptomatic anemia   Acute on chronic diastolic CHF (congestive heart failure) (HCC)   Heme positive stool   Non-intractable vomiting   Abdominal pain, epigastric   Pain of lower extremity   Colonic mass   Colon polyps   Cellulitis of leg, left  1. Symptomatic iron deficiency anemia due to colonic mass/ near the hepatic flexure.Severe iron deficiency(serum iron at 9.0, TIBC at 413, ferritin 4 and transferrin saturation of 2). Patient has received 2 units prbc transfusion and IV iron infusionx1.Colonic biopsy positive for adenocarcinoma. She currently is not a goos candidate for elective abdominal surgery, She has a very peri-operative risk due to multiple comorbidities. Will follow on Oncology recommendations with Dr. Burr Medico.   2. New left hialar mass 5,4 cm. Patient underwent bronchoscopy today for further diagnostics, will follow on report. Continue oxymetry monitoring.   2. Decompensated diastolic heart failure, acute on chronic.EF 60 to 65% with moderately increased LV wall thickness. She achieved negative fluid balance during her hospitalization. Continue blood pressure monitoring. Will resume furosemide in am. Her renal function has been stable at 1,4.   3.Left leg cellulitis.antibiotic therapy has been completed.  4. Morbid obesity and dyslipidemia.Calculated BMI is 48.7  5. T2DM.Her fasting glucose this am is 188. Continue with basal insulin with 20 units daily,5 units pre-meal, plus sliding scale insulin.   6. Hypothyroid.Onlevothyroxine.   7. AKI on CKD stage 3. renal function with serum cr at 1,4 and K at 3,9, will resume furosemide in am. Follow on renal panel in am.  DVT prophylaxis: heparin   Code Status:  full Family Communication: no family at the bedside   Disposition Plan/ discharge barriers: pending oncology evaluation.    Body mass index is 49.08 kg/m. Malnutrition Type:      Malnutrition Characteristics:      Nutrition Interventions:     RN Pressure Injury Documentation:     Consultants:   Cardiology   Surgery   Oncology   Pulmonary   Procedures:   Egd, colonoscopy, bronchoscopy (biopsies)    Antimicrobials:       Subjective: Patient is sp bronchoscopy today, very tired, no nausea or vomiting, positive cough but no frank dyspnea.   Objective: Vitals:   09/14/18 1545 09/14/18 1555 09/14/18 1611 09/14/18 1628  BP: (!) 134/57 130/62 132/74 (!) 146/65  Pulse: 86 80 82 81  Resp: 20 15 16 18   Temp:  (!) 97.3 F (36.3 C)  98.2 F (36.8 C)  TempSrc:    Oral  SpO2: 97% 94% 100% 99%  Weight:      Height:        Intake/Output Summary (Last 24 hours) at 09/14/2018 1727 Last data filed at 09/14/2018 1643 Gross per 24 hour  Intake 1223 ml  Output 1625 ml  Net -402 ml   Filed Weights   09/12/18 0100 09/13/18 0629 09/14/18 0507  Weight: (!) 138.9 kg (!) 138 kg (!) 137.9 kg    Examination:   General: Not in pain or dyspnea, deconditioned  Neurology: Awake and alert, non focal  E ENT: mild pallor, no icterus, oral mucosa moist Cardiovascular: No JVD. S1-S2 present, rhythmic, no gallops, rubs, or murmurs. +++ non pitting lower extremity edema. Pulmonary: positive breath sounds bilaterally, adequate air movement, no wheezing, rhonchi or rales. Gastrointestinal. Abdomen protuberant with no organomegaly, non tender, no rebound or guarding Skin. No rashes Musculoskeletal: no joint deformities     Data Reviewed: I have personally reviewed following labs and imaging studies  CBC: Recent Labs  Lab 09/08/18 0613 09/09/18 0425 09/12/18 0458  WBC 8.7 8.2 8.0  HGB 8.5* 8.6* 7.7*  HCT 29.8* 29.8* 26.9*  MCV 87.1 87.1 86.8  PLT 318 303 997   Basic Metabolic Panel: Recent Labs  Lab 09/09/18 0425  09/11/18 1352 09/12/18 0458 09/13/18 0554 09/14/18 0354  NA 138 133* 135 135 136  K 4.1 4.4 4.3 4.4 3.9  CL 100 96* 98 99 96*  CO2 28 27 30 28 29   GLUCOSE 143* 219* 130* 197* 188*  BUN 43* 31* 29* 35* 42*  CREATININE 1.18* 1.38* 1.29* 1.44* 1.43*  CALCIUM 8.8* 8.5* 8.6* 8.4* 8.9  PHOS 4.1  --   --   --   --    GFR: Estimated Creatinine Clearance: 53.9 mL/min (A) (by C-G formula based on SCr of 1.43 mg/dL (H)). Liver Function Tests: Recent Labs  Lab 09/09/18 0425  ALBUMIN 3.3*   No results for input(s): LIPASE, AMYLASE in the last 168 hours. No results for input(s): AMMONIA in the last 168 hours. Coagulation Profile: No results for input(s): INR, PROTIME in the last 168 hours. Cardiac Enzymes: No results for input(s): CKTOTAL, CKMB, CKMBINDEX, TROPONINI in the last 168 hours. BNP (last 3 results) No results for input(s): PROBNP in the last 8760 hours. HbA1C: No results for input(s): HGBA1C in the last 72 hours. CBG: Recent Labs  Lab 09/13/18 2111 09/14/18 0619 09/14/18 1202 09/14/18 1514 09/14/18 1624  GLUCAP 207* 164* 163* 148* 181*   Lipid Profile: No results for input(s): CHOL, HDL,  LDLCALC, TRIG, CHOLHDL, LDLDIRECT in the last 72 hours. Thyroid Function Tests: No results for input(s): TSH, T4TOTAL, FREET4, T3FREE, THYROIDAB in the last 72 hours. Anemia Panel: No results for input(s): VITAMINB12, FOLATE, FERRITIN, TIBC, IRON, RETICCTPCT in the last 72 hours.    Radiology Studies: I have reviewed all of the imaging during this hospital visit personally     Scheduled Meds: . aspirin EC  81 mg Oral Daily  . atorvastatin  80 mg Oral q morning - 10a  . cholecalciferol  5,000 Units Oral Daily  . fentaNYL      . gabapentin  300 mg Oral QID  . Gerhardt's butt cream  1 application Topical TID  . insulin aspart  0-15 Units Subcutaneous TID WC  . insulin aspart  0-5 Units Subcutaneous QHS  . insulin aspart  5 Units Subcutaneous TID WC  . insulin detemir  20  Units Subcutaneous QHS  . levothyroxine  200 mcg Oral QAC breakfast  . nystatin   Topical BID  . omega-3 acid ethyl esters  1 g Oral Daily  . pantoprazole  40 mg Oral BID  . polyethylene glycol  17 g Oral BID  . senna  2 tablet Oral BID WC  . sodium chloride flush  3 mL Intravenous Q12H  . topiramate  150 mg Oral QPM  . umeclidinium-vilanterol  1 puff Inhalation Daily   Continuous Infusions: . sodium chloride Stopped (09/11/18 1716)  . lactated ringers 10 mL/hr at 09/14/18 1236     LOS: 14 days        Ulises Wolfinger Gerome Apley, MD

## 2018-09-14 NOTE — Anesthesia Preprocedure Evaluation (Signed)
Anesthesia Evaluation  Patient identified by MRN, date of birth, ID band Patient awake    Reviewed: Allergy & Precautions, NPO status , Patient's Chart, lab work & pertinent test results  Airway Mallampati: II  TM Distance: >3 FB Neck ROM: Full    Dental  (+) Missing, Dental Advisory Given   Pulmonary shortness of breath, with exertion and Long-Term Oxygen Therapy, pneumonia, resolved, COPD,  COPD inhaler and oxygen dependent, former smoker,    breath sounds clear to auscultation + decreased breath sounds      Cardiovascular Exercise Tolerance: Good hypertension, + angina with exertion + CAD and +CHF  Normal cardiovascular examII Rhythm:Regular Rate:Normal  Uses walker  Denies recent NTG use  States less SOB after diuresis   Neuro/Psych Anxiety negative neurological ROS  negative psych ROS   GI/Hepatic negative GI ROS, Neg liver ROS,   Endo/Other  diabetes, Well Controlled, Type 1, Insulin DependentHypothyroidism Morbid obesity  Renal/GU Renal InsufficiencyRenal disease  negative genitourinary   Musculoskeletal  (+) Arthritis ,   Abdominal (+) + obese,   Peds negative pediatric ROS (+)  Hematology negative hematology ROS (+) anemia ,   Anesthesia Other Findings   Reproductive/Obstetrics negative OB ROS                             Anesthesia Physical  Anesthesia Plan  ASA: IV  Anesthesia Plan: General   Post-op Pain Management:    Induction: Intravenous  PONV Risk Score and Plan: 3 and Ondansetron, Dexamethasone and Treatment may vary due to age or medical condition  Airway Management Planned: Oral ETT  Additional Equipment: None  Intra-op Plan:   Post-operative Plan:   Informed Consent: I have reviewed the patients History and Physical, chart, labs and discussed the procedure including the risks, benefits and alternatives for the proposed anesthesia with the patient or  authorized representative who has indicated his/her understanding and acceptance.   Patient has DNR.  Suspend DNR.   Dental advisory given  Plan Discussed with: CRNA  Anesthesia Plan Comments:         Anesthesia Quick Evaluation

## 2018-09-14 NOTE — Progress Notes (Signed)
Physical Therapy Treatment Patient Details Name: Stephanie Combs MRN: [id number] DOB: Nov 15, 1950 Today's Date: 09/14/2018    History of Present Illness 68 y.o. female, w CAD s/p DES prox LAD, CHF (EF 60-65%), Dm2, CKD stage3, Hypertension, Hypothyroidism, Chronic lymphedema, with c/o dyspnea for the past 5 days, along with increase in lower ext edema and R LQ pain.  Hx of diverticulosis. Colonoscopy reveals R colon mass. Transferred to [facility] for surgery 5/4.    PT Comments    Pt awaiting transfer for brochoscopy with biopsy, but willing to walk with therapy while waiting. Once sitting EoB pt reports need to use BSC. Pt min guard for transfer to/from bed to [facility]. Pt standing to walk when transport arrived and therapy session abbreviated. PT will continue to follow acutely.   Follow Up Recommendations  Home health PT;Supervision for mobility/OOB;Supervision - Intermittent     Equipment Recommendations  None recommended by PT       Precautions / Restrictions Precautions Precautions: Fall Restrictions Weight Bearing Restrictions: No    Mobility  Bed Mobility Overal bed mobility: Needs Assistance Bed Mobility: Supine to Sit;Sit to Supine     Supine to sit: Supervision Sit to supine: Min assist   General bed mobility comments: supervsion for coming to EoB, requiring increased time and effort. minA for lifting R LE back into bed  Transfers Overall transfer level: Needs assistance Equipment used: 1 person hand held assist Transfers: Sit to/from Stand   Stand pivot transfers: Min guard       General transfer comment: min guard for pivoting to/from [facility]        Balance Overall balance assessment: Needs assistance Sitting-balance support: Feet supported;No upper extremity supported Sitting balance-Leahy Scale: Good     Standing balance support: During functional activity;Single extremity supported Standing balance-Leahy Scale: Poor Standing balance comment: requires single UE  support for dynamic balance                            Cognition Arousal/Alertness: Awake/alert Behavior During Therapy: WFL for tasks assessed/performed Overall Cognitive Status: Within Functional Limits for tasks assessed                                           General Comments General comments (skin integrity, edema, etc.): Pt on 2L O2 throughout session      Pertinent Vitals/Pain Pain Assessment: Faces Faces Pain Scale: Hurts a little bit Pain Location: Rt shoulder and BLE Pain Descriptors / Indicators: Discomfort;Pressure Pain Intervention(s): Limited activity within patient's tolerance;Monitored during session;Repositioned           PT Goals (current goals can now be found in the care plan section) Acute Rehab PT Goals PT Goal Formulation: With patient Time For Goal Achievement: 09/25/18 Potential to Achieve Goals: Fair    Frequency    Min 3X/week      PT Plan Current plan remains appropriate       AM-PAC PT "6 Clicks" Mobility   Outcome Measure  Help needed turning from your back to your side while in a flat bed without using bedrails?: None Help needed moving from lying on your back to sitting on the side of a flat bed without using bedrails?: None Help needed moving to and from a bed to a chair (including a wheelchair)?: A Little Help needed standing up from a  chair using your arms (e.g., wheelchair or bedside chair)?: A Little Help needed to walk in hospital room?: A Little Help needed climbing 3-5 steps with a railing? : A Lot 6 Click Score: 19    End of Session Equipment Utilized During Treatment: Oxygen Activity Tolerance: Patient tolerated treatment well;Patient limited by fatigue Patient left: with call bell/phone within reach;in chair(surgical PA in room) Nurse Communication: Mobility status PT Visit Diagnosis: Unsteadiness on feet (R26.81);Other abnormalities of gait and mobility (R26.89);Muscle weakness  (generalized) (M62.81)     Time: 1438-8875 PT Time Calculation (min) (ACUTE ONLY): 12 min  Charges:  $Therapeutic Activity: 8-22 mins                     Quentyn Kolbeck B. Migdalia Dk PT, DPT Acute Rehabilitation Services Pager [phone] Office [phone]    [place] 09/14/2018, 1:45 PM

## 2018-09-14 NOTE — Progress Notes (Signed)
Nutrition Brief Note  RD working remotely.  RD pulled to chart due to LOS.  Wt Readings from Last 15 Encounters:  09/14/18 (!) 137.9 kg  08/25/18 (!) 138.3 kg  08/05/18 (!) 141.1 kg  07/08/18 131.3 kg  04/08/18 (!) 138.7 kg  01/12/18 126.6 kg  01/06/18 127.5 kg  12/28/17 (!) 138.3 kg  12/22/17 (!) 138.3 kg  11/26/17 (!) 138.5 kg  11/10/17 133.4 kg  11/09/17 133.4 kg  10/26/17 108.4 kg  10/25/17 134.5 kg  10/16/17 129.3 kg   Stephanie Combs  is a 68 y.o. female, w CAD s/p DES prox LAD, CHF (EF 60-65%), Dm2, CKD stage3, Hypertension, Hypothyroidism, Chronic lymphedema, apparently presents with c/o dyspnea for the past 5 days, along with increase in lower ext edema, refractory to increase in oral lasix. Slight mostly dry cough.  Pt denies fever, chills, cp, palp, abd pain, n/v, diarrhea,  Brbpr.  Pt is taking iron for her chronic anemia. Pt notes having had prior colonoscopy, normal.  Hx of diverticulosis.    Pt admitted with dyspnea secondary to symptomatic anemia.   4/30- s/p EGD/colonoscopy- revealed colon mass which was biopsied and tattooed 5/1- transferred form [facility] to [facility] 5/3- CT of chest revealed lung mass concerning for malignancy  Reviewed I/O's: +5 ml x 24 hours and -16 L since 08/31/18  UOP: 700 ml x 24 hours  Per PCCM notes, pt is not an operative candidate. Pt currently NPO for bronchoscopy today.   Reviewed palliative care notes. Pt amenable to DNI/DNR, but als requesting aggressive treatment measures. She is interested in outpatient palliative follow-up. She does not desire trach or PEG. She is awaiting oncology input for further treatment options.   Pt with good appetite. Noted meal completion 100%. Wt has been stable.   Lab Results  Component Value Date   HGBA1C 7.5 (H) 08/30/2018   PTA DM medications are 40 units insulin detemir q HS and 1-20 units insulin aspart TID.   Labs reviewed: CBGS: 144-207 (inpatient orders for glycemic control are 0-15 units  insulin aspart TID with meals, 0-5 units insulin aspart q HS, 5 units insulin aspart TID with meals, and 20 units insulin detemir q HS).   Body mass index is 49.08 kg/m. Patient meets criteria for extreme obesity, class III based on current BMI.   Current diet order is NPO (previously heart healthy/ carb modified), patient is consuming approximately 100% of meals at this time. Labs and medications reviewed.   No nutrition interventions warranted at this time. If nutrition issues arise, please consult RD.   Stephanie Combs A. Jimmye Norman, RD, LDN, [place] Registered Dietitian II Certified Diabetes Care and Education Specialist Pager: [phone] After hours Pager: [phone]

## 2018-09-14 NOTE — Op Note (Signed)
Video Bronchoscopy with Endobronchial Ultrasound Procedure Note  Date of Operation: 09/14/2018  Pre-op Diagnosis: Left perihilar left upper lobe mass and mediastinal adenopathy  Post-op Diagnosis: Left perihilar left upper lobe mass and mediastinal adenopathy  Surgeon: Margaretha Seeds, MD  Assistants: Refer to OR list  Anesthesia: General endotracheal anesthesia  Operation: Flexible video fiberoptic bronchoscopy with endobronchial ultrasound and biopsies.  Estimated Blood Loss: Minimal  Complications: None  Indications and History: [name] is a 68 y.o. female with left perihilar lung mass.  The risks, benefits, complications, treatment options and expected outcomes were discussed with the patient.  The possibilities of pneumothorax, pneumonia, reaction to medication, pulmonary aspiration, perforation of a viscus, bleeding, failure to diagnose a condition and creating a complication requiring transfusion or operation were discussed with the patient who freely signed the consent.    Description of Procedure: The patient was examined in the preoperative area and history and data from the preprocedure consultation were reviewed. It was deemed appropriate to proceed.  The patient was taken to OR 17, identified as [name] and the procedure verified as Flexible Video Fiberoptic Bronchoscopy.  A Time Out was held and the above information confirmed. After being taken to the operating room general anesthesia was initiated and the patient  was orally intubated.   The video fiberoptic bronchoscope was introduced via the endotracheal tube and a general inspection was performed. RUL, RML, RLL, LUL, LLL and their subsegments were visualized. Exam showed extrinsic compression of left lung, LUL > LLL; easily friable mucosa, thin white secretions present that were easily suctioned. Sampling as noted below.  Left perihilar mass   Station 7   Lymph Node 4R   Lymph Node 10R   LLL   RLL   RML   RUL   Extrinsic compression of LUL   Extrinsic compression of LUL and LLL    The standard scope was then withdrawn and the endobronchial ultrasound was used to identify and characterize the peritracheal, hilar and bronchial lymph nodes. Transbronchial needle sampling was performed as noted below.   Samples: 1. Brushings x 3 of LUL for cytology; brush clipped and sent for cytology 2. TBNA x 3 from 11R (measured 15mm) node  3. TBNA x 3 from 4R (25mm) node  4. TBNA x 5 from 7 (88mm) node  5. TBNA x 3 from left perihilar lung mass  6. Forcep biopsy x 2 of LUL   The patient tolerated the procedure well without apparent complications. There was no significant blood loss. The bronchoscope was withdrawn. Anesthesia was reversed and the patient was taken to the PACU for recovery.  Plans:  The patient will be discharged from the PACU to home when recovered from anesthesia. We will review the cytology, pathology and microbiology results with the patient when they become available.   Rodman Pickle, M.D. [facility] Pulmonary/Critical Care Medicine Pager: [phone] After hours pager: [phone]

## 2018-09-14 NOTE — Progress Notes (Signed)
Progress Note  Patient Name: Stephanie Combs Date of Encounter: 09/15/2018  Primary Cardiologist: Kate Sable, MD   Subjective   Pt has no cardiac complaints.  Hx of stenting in the past    Inpatient Medications    Scheduled Meds: . aspirin EC  81 mg Oral Daily  . atorvastatin  80 mg Oral q morning - 10a  . cholecalciferol  5,000 Units Oral Daily  . furosemide  40 mg Oral Daily  . gabapentin  300 mg Oral QID  . Gerhardt's butt cream  1 application Topical TID  . insulin aspart  0-15 Units Subcutaneous TID WC  . insulin aspart  0-5 Units Subcutaneous QHS  . insulin aspart  5 Units Subcutaneous TID WC  . insulin detemir  20 Units Subcutaneous QHS  . levothyroxine  200 mcg Oral QAC breakfast  . nystatin   Topical BID  . omega-3 acid ethyl esters  1 g Oral Daily  . pantoprazole  40 mg Oral BID  . polyethylene glycol  17 g Oral BID  . senna  2 tablet Oral BID WC  . sodium chloride flush  3 mL Intravenous Q12H  . topiramate  150 mg Oral QPM  . umeclidinium-vilanterol  1 puff Inhalation Daily   Continuous Infusions: . sodium chloride Stopped (09/11/18 1716)  . lactated ringers 10 mL/hr at 09/14/18 1236   PRN Meds: sodium chloride, acetaminophen **OR** acetaminophen, albuterol, ALPRAZolam, bisacodyl, nitroGLYCERIN, ondansetron, oxyCODONE-acetaminophen **AND** oxyCODONE, sodium chloride flush, tiZANidine   Vital Signs    Vitals:   09/14/18 1628 09/14/18 1935 09/15/18 0428 09/15/18 0549  BP: (!) 146/65 (!) 112/47  (!) 127/47  Pulse: 81 81  63  Resp: 18 18  18   Temp: 98.2 F (36.8 C) 98.5 F (36.9 C)  98 F (36.7 C)  TempSrc: Oral Oral    SpO2: 99% 100%  100%  Weight:   (!) 138.4 kg   Height:        Intake/Output Summary (Last 24 hours) at 09/15/2018 0823 Last data filed at 09/15/2018 0741 Gross per 24 hour  Intake 1563 ml  Output 1725 ml  Net -162 ml   Last 3 Weights 09/15/2018 09/14/2018 09/13/2018  Weight (lbs) 305 lb 3.2 oz 304 lb 1.6 oz 304 lb 3.8 oz   Weight (kg) 138.438 kg 137.939 kg 138 kg      Telemetry     NSR  - Personally Reviewed  ECG    No new tracings - Personally Reviewed  Physical Exam   GEN: morbidly obese female .   Neck: No JVD Cardiac: RRR, no murmurs, rubs, or gallops.  Respiratory: Clear to auscultation bilaterally. GI: Soft, nontender, non-distended  MS: No edema; No deformity. Neuro:  Nonfocal  Psych: Normal affect   Labs    Chemistry Recent Labs  Lab 09/09/18 0425  09/13/18 0554 09/14/18 0354 09/15/18 0514  NA 138   < > 135 136 137  K 4.1   < > 4.4 3.9 4.4  CL 100   < > 99 96* 102  CO2 28   < > 28 29 27   GLUCOSE 143*   < > 197* 188* 243*  BUN 43*   < > 35* 42* 37*  CREATININE 1.18*   < > 1.44* 1.43* 1.35*  CALCIUM 8.8*   < > 8.4* 8.9 8.8*  ALBUMIN 3.3*  --   --   --   --   GFRNONAA 47*   < > 37* 38* 40*  GFRAA  55*   < > 43* 44* 47*  ANIONGAP 10   < > 8 11 8    < > = values in this interval not displayed.     Hematology Recent Labs  Lab 09/09/18 0425 09/12/18 0458 09/15/18 0514  WBC 8.2 8.0 7.0  RBC 3.42* 3.10* 2.96*  HGB 8.6* 7.7* 7.6*  HCT 29.8* 26.9* 25.5*  MCV 87.1 86.8 86.1  MCH 25.1* 24.8* 25.7*  MCHC 28.9* 28.6* 29.8*  RDW 19.9* 20.9* 21.2*  PLT 303 267 267    Cardiac EnzymesNo results for input(s): TROPONINI in the last 168 hours. No results for input(s): TROPIPOC in the last 168 hours.   BNPNo results for input(s): BNP, PROBNP in the last 168 hours.   DDimer No results for input(s): DDIMER in the last 168 hours.   Radiology    No results found.  Cardiac Studies   Echo 07/05/18:  1. The left ventricle has normal systolic function with an ejection fraction of 60-65%. The cavity size was normal. There is moderately increased left ventricular wall thickness. Left ventricular diastolic Doppler parameters are consistent with impaired  relaxation. No evidence of left ventricular regional wall motion abnormalities.  2. The right ventricle has normal systolic function.  The cavity was normal. There is no increase in right ventricular wall thickness. Right ventricular systolic pressure is moderately elevated with an estimated pressure of 46.8 mmHg.  3. The mitral valve is normal in structure. There is mild mitral annular calcification present.  4. The tricuspid valve is normal in structure.  5. The aortic valve was not well visualized.  6. No evidence of left ventricular regional wall motion abnormalities.   Right and left heart cath 05/19/17:  Colon Flattery LAD lesion is 65% stenosed.  Prox LAD-1 lesion is 70% stenosed with 80% stenosed side branch in Ost 1st Diag.  Prox LAD-2 lesion is 60% stenosed.  LV end diastolic pressure is mildly elevated.  There is no aortic valve stenosis.  Hemodynamic findings consistent with mild pulmonary hypertension.   Patient has essentially severe single/2-vessel disease involving the LAD major diagonal branch with otherwise normal circumflex, ramus and RCA with PDA/PLA.  There is ostial LAD as well as bifurcation LAD having diagonal.  By rights, the patient's best option would be bypass surgery, however I have been told by her primary team that she probably is not a good bypass candidate.  I will therefore discuss the findings with my interventional colleagues to determine if there is an approachable option for bifurcation PCI.  The ostial LAD may be not approachable.  Plan: She will be transferred to [facility] 6 E./stepdown unit. We will start IV heparin 6-8 hours post sheath removal/TR band removal. Continue symptomatic treatment with anginal agents.    Coronary stent intervention 05/22/17:  Ost LAD lesion is 55% stenosed.  Prox LAD lesion is 95% stenosed with 80% stenosed side branch in Ost 1st Diag.  A drug-eluting stent was successfully placed using a STENT SYNERGY DES 2.5X20.  Post intervention, there is a 0% residual stenosis.  Post intervention, the side branch was reduced to 20% residual stenosis.    1. Successful PCI with stenting of the LAD with a DES and balloon angioplasty of the diagonal branch   Plan: continue DAPT for at least one year. Anticipate DC when medically stable.    Patient Profile     68 y.o. female with past medical history ofCAD (s/p DES to [place] angioplasty to [place] 05/2017), chronic diastolic CHF, HTN,  HLD,IDDM,COPD,Hypothyroidism,and Stage 3 CKD.  Found to have colon cancer and now has been found to have a lung mass - likely a lung cancer. Preop risk assessment requested.  Assessment & Plan    1. Coronary artery disease - last PCI with DES to LAD 2019 - ASA 81 mg - no anginal complaints     2. Acute on chronic diastolic heart failure - was diuresed on 40 mg lasix, now discontinued - overall net negative 12 L with 1.7 L urine output yesterday - weight is stable at 305 lbs - lasix transitioned to 40 mg PO daily without potassium supplementation   3. Colon cancer - GI recommending palliative care instead of surgery at this point   4. Lung cancer (new hilar mass) - bronchoscopy yesterday, awaiting path   5. Iron deficiency anemia in the setting of cancer - Hb 7.6 (7.7) - per primary   5. Acute on chronic kidney disease stage III - sCr 1.35 (1.43) - improving - baseline appears to be 1.2-1.3 - K 4.4 - per primary     For questions or updates, please contact [place] Please consult [email or web address] for contact info under        Signed, Ledora Bottcher, PA  09/15/2018, 8:23 AM    Attending Note:   The patient was seen and examined.  Agree with assessment and plan as noted above.  Changes made to the above note as needed.  Patient seen and independently examined with Doreene Adas, PA .   We discussed all aspects of the encounter. I agree with the assessment and plan as stated above.  1. CAD :  S/p stenting in Jan. 2019.  No angina  Continue meds No active cardiac issues. We will sign off  2.  Lung cancer / colon cancer:   Plans per oncology   [facility] will sign off.   Medication Recommendations:  Cont current meds.  Other recommendations (labs, testing, etc):   Follow up as an outpatient:  With Dr. Prudy Feeler    I have spent a total of 40 minutes with patient reviewing hospital  notes , telemetry, EKGs, labs and examining patient as well as establishing an assessment and plan that was discussed with the patient. > 50% of time was spent in direct patient care.    Thayer Headings, Brooke Bonito., MD, [facility] 09/15/2018, 2:25 PM [address],  [place] Pager [phone]

## 2018-09-14 NOTE — Anesthesia Procedure Notes (Signed)
Procedure Name: Intubation Date/Time: 09/14/2018 1:15 PM Performed by: Candis Shine, CRNA Pre-anesthesia Checklist: Patient identified, Emergency Drugs available, Suction available and Patient being monitored Patient Re-evaluated:Patient Re-evaluated prior to induction Oxygen Delivery Method: Circle System Utilized Preoxygenation: Pre-oxygenation with 100% oxygen Induction Type: IV induction and Rapid sequence Laryngoscope Size: Mac and 3 Grade View: Grade I Tube type: Oral Tube size: 8.5 mm Number of attempts: 1 Airway Equipment and Method: Stylet Placement Confirmation: ETT inserted through vocal cords under direct vision,  positive ETCO2 and breath sounds checked- equal and bilateral Secured at: 22 cm Tube secured with: Tape Dental Injury: Teeth and Oropharynx as per pre-operative assessment

## 2018-09-14 NOTE — Transfer of Care (Signed)
Immediate Anesthesia Transfer of Care Note  Patient: Stephanie Combs  Procedure(s) Performed: VIDEO BRONCHOSCOPY WITH ENDOBRONCHIAL ULTRASOUND with biopsies (Left Chest)  Patient Location: PACU  Anesthesia Type:General  Level of Consciousness: awake, alert  and oriented  Airway & Oxygen Therapy: Patient Spontanous Breathing and Patient connected to face mask oxygen  Post-op Assessment: Report given to RN and Post -op Vital signs reviewed and stable  Post vital signs: Reviewed and stable  Last Vitals:  Vitals Value Taken Time  BP 142/62 09/14/2018  3:13 PM  Temp    Pulse 86 09/14/2018  3:15 PM  Resp 21 09/14/2018  3:15 PM  SpO2 100 % 09/14/2018  3:15 PM  Vitals shown include unvalidated device data.  Last Pain:  Vitals:   09/14/18 1236  TempSrc:   PainSc: 0-No pain      Patients Stated Pain Goal: 2 (54/98/26 4158)  Complications: No apparent anesthesia complications

## 2018-09-15 ENCOUNTER — Encounter (HOSPITAL_COMMUNITY): Payer: Self-pay | Admitting: Oncology

## 2018-09-15 DIAGNOSIS — C183 Malignant neoplasm of hepatic flexure: Secondary | ICD-10-CM

## 2018-09-15 DIAGNOSIS — C3412 Malignant neoplasm of upper lobe, left bronchus or lung: Secondary | ICD-10-CM

## 2018-09-15 LAB — ACID FAST SMEAR (AFB, MYCOBACTERIA): Acid Fast Smear: NEGATIVE

## 2018-09-15 LAB — BASIC METABOLIC PANEL
Anion gap: 8 (ref 5–15)
BUN: 37 mg/dL — ABNORMAL HIGH (ref 8–23)
CO2: 27 mmol/L (ref 22–32)
Calcium: 8.8 mg/dL — ABNORMAL LOW (ref 8.9–10.3)
Chloride: 102 mmol/L (ref 98–111)
Creatinine, Ser: 1.35 mg/dL — ABNORMAL HIGH (ref 0.44–1.00)
GFR calc Af Amer: 47 mL/min — ABNORMAL LOW (ref >60)
GFR calc non Af Amer: 40 mL/min — ABNORMAL LOW (ref >60)
Glucose, Bld: 243 mg/dL — ABNORMAL HIGH (ref 70–99)
Potassium: 4.4 mmol/L (ref 3.5–5.1)
Sodium: 137 mmol/L (ref 135–145)

## 2018-09-15 LAB — GLUCOSE, CAPILLARY
Glucose-Capillary: 224 mg/dL — ABNORMAL HIGH (ref 70–99)
Glucose-Capillary: 212 mg/dL — ABNORMAL HIGH (ref 70–99)
Glucose-Capillary: 225 mg/dL — ABNORMAL HIGH (ref 70–99)

## 2018-09-15 LAB — CBC
HCT: 25.5 % — ABNORMAL LOW (ref 36.0–46.0)
Hemoglobin: 7.6 g/dL — ABNORMAL LOW (ref 12.0–15.0)
MCH: 25.7 pg — ABNORMAL LOW (ref 26.0–34.0)
MCHC: 29.8 g/dL — ABNORMAL LOW (ref 30.0–36.0)
MCV: 86.1 fL (ref 80.0–100.0)
Platelets: 267 10*3/uL (ref 150–400)
RBC: 2.96 MIL/uL — ABNORMAL LOW (ref 3.87–5.11)
RDW: 21.2 % — ABNORMAL HIGH (ref 11.5–15.5)
WBC: 7 10*3/uL (ref 4.0–10.5)
nRBC: 0 % (ref 0.0–0.2)

## 2018-09-15 MED ORDER — SODIUM CHLORIDE 0.9 % IV SOLN
510.0000 mg | Freq: Once | INTRAVENOUS | Status: AC
  Administered 2018-09-16: 510 mg via INTRAVENOUS
  Filled 2018-09-15 (×2): qty 17

## 2018-09-15 MED ORDER — INSULIN DETEMIR 100 UNIT/ML ~~LOC~~ SOLN
28.0000 [IU] | Freq: Every day | SUBCUTANEOUS | Status: DC
  Administered 2018-09-15 – 2018-09-16 (×2): 28 [IU] via SUBCUTANEOUS
  Filled 2018-09-15 (×3): qty 0.28

## 2018-09-15 NOTE — Care Management Important Message (Signed)
Important Message  Patient Details  Name: Stephanie Combs MRN: [id number] Date of Birth: 03-16-1951   Medicare Important Message Given:  Yes   Due to illness patient is not able to sign.  Unsigned copy left  Sukari Grist 09/15/2018, 12:21 PM

## 2018-09-15 NOTE — Progress Notes (Signed)
1 Day Post-Op  Subjective: CC: Nausea Patient feels "sick to her stomach" and a little nauseated this morning but denies abdominal pain. Reports she has felt that way since getting back from bronchoscopy yesterday. Was able to eat last night without emesis. Continues to pass flatus. Normal BM yesterday.   Objective: Vital signs in last 24 hours: Temp:  [97.2 F (36.2 C)-98.5 F (36.9 C)] 98 F (36.7 C) (05/06 0549) Pulse Rate:  [63-87] 63 (05/06 0549) Resp:  [10-20] 18 (05/06 0549) BP: (112-147)/(47-74) 127/47 (05/06 0549) SpO2:  [94 %-100 %] 98 % (05/06 0833) Weight:  [138.4 kg] 138.4 kg (05/06 0428) Last BM Date: 09/14/18  Intake/Output from previous day: 05/05 0701 - 05/06 0700 In: 5397 [P.O.:220; I.V.:1103] Out: 1725 [Urine:1725] Intake/Output this shift: Total I/O In: 240 [P.O.:240] Out: -   PE: Gen: Awake and alert, anxious. OOB in chair Heart: Regular Lungs: CTA b/l On 2L o2.  Abd: Obese, soft, ND,NT. +BS. Prior umbilical surgical scar.  Msk: B/l LE edema  Lab Results:  Recent Labs    09/15/18 0514  WBC 7.0  HGB 7.6*  HCT 25.5*  PLT 267   BMET Recent Labs    09/14/18 0354 09/15/18 0514  NA 136 137  K 3.9 4.4  CL 96* 102  CO2 29 27  GLUCOSE 188* 243*  BUN 42* 37*  CREATININE 1.43* 1.35*  CALCIUM 8.9 8.8*   PT/INR No results for input(s): LABPROT, INR in the last 72 hours. CMP     Component Value Date/Time   NA 137 09/15/2018 0514   K 4.4 09/15/2018 0514   CL 102 09/15/2018 0514   CO2 27 09/15/2018 0514   GLUCOSE 243 (H) 09/15/2018 0514   BUN 37 (H) 09/15/2018 0514   BUN 28 (A) 11/11/2016   CREATININE 1.35 (H) 09/15/2018 0514   CALCIUM 8.8 (L) 09/15/2018 0514   PROT 5.8 (L) 09/02/2018 0500   ALBUMIN 3.3 (L) 09/09/2018 0425   AST 12 (L) 09/02/2018 0500   ALT 10 09/02/2018 0500   ALKPHOS 97 09/02/2018 0500   BILITOT 0.6 09/02/2018 0500   GFRNONAA 40 (L) 09/15/2018 0514   GFRAA 47 (L) 09/15/2018 0514   Lipase     Component  Value Date/Time   LIPASE 21 04/05/2018 1629       Studies/Results: No results found.  Anti-infectives: Anti-infectives (From admission, onward)   Start     Dose/Rate Route Frequency Ordered Stop   09/11/18 1300  cephALEXin (KEFLEX) capsule 500 mg  Status:  Discontinued     500 mg Oral Every 6 hours 09/11/18 1253 09/12/18 1308   09/07/18 1615  ceFAZolin (ANCEF) IVPB 1 g/50 mL premix  Status:  Discontinued     1 g 100 mL/hr over 30 Minutes Intravenous Every 8 hours 09/07/18 1605 09/11/18 1253   09/05/18 1715  cefTRIAXone (ROCEPHIN) 2 g in sodium chloride 0.9 % 100 mL IVPB  Status:  Discontinued     2 g 200 mL/hr over 30 Minutes Intravenous Every 24 hours 09/05/18 1705 09/06/18 1757   09/05/18 1645  cefTRIAXone (ROCEPHIN) 1 g in sodium chloride 0.9 % 100 mL IVPB  Status:  Discontinued     1 g 200 mL/hr over 30 Minutes Intravenous Every 24 hours 09/05/18 1634 09/05/18 1705       Assessment/Plan COPD on 2L home O2 DM CHF admitted for fluid overload CAD s/p stent 1/19 HTN Lymphedema  Anemia- Hgb 7.6 & stable on 5/6. VSS.  DNR status  Right Colon Mass - Colonoscopy at [facility] on 4/30 with colon mass near the hepatic flexure, which was biopsied and tattooed.  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  - Underwent Bronchoscopy 5/5. Await cytology, pathology and microbiology results  - Poor pulmonary functionand cardiac risk factors. ACS surgery risk calculator predicts mortality of 40% w/ partial colectomy. High likelihood she would not come off vent w/ underlying pulmonary function. Discussed this with her.  - Appreciate Palliative care consult on 5/4  - Recommend med-onc evaluation  -Noevidence of complete obstruction or indication for emergency surgery. Not operative candidate at this point.  - We will continue to follow    FEN - HH diet  VTE - SCD, okay for chemical prophylaxis from a surgical standpoint ID - none currently POC: Daughter Marlyne Beards - has stated to myself and others she does not want to discuss with her daughter until she has more definitive answers    LOS: 15 days    Jillyn Ledger , [facility] Surgery 09/15/2018, 10:01 AM Pager: [phone]

## 2018-09-15 NOTE — Progress Notes (Signed)
Inpatient Diabetes Program Recommendations  AACE/ADA: New Consensus Statement on Inpatient Glycemic Control (2015)  Target Ranges:  Prepandial:   less than 140 mg/dL      Peak postprandial:   less than 180 mg/dL (1-2 hours)      Critically ill patients:  140 - 180 mg/dL   Lab Results  Component Value Date   GLUCAP 212 (H) 09/15/2018   HGBA1C 7.5 (H) 08/30/2018    Review of Glycemic Control Results for BLIMI, GODBY (MRN [id number]) as of 09/15/2018 13:55  Ref. Range 09/14/2018 20:57 09/15/2018 06:23 09/15/2018 12:25  Glucose-Capillary Latest Ref Range: 70 - 99 mg/dL 307 (H) 224 (H) 212 (H)   Diabetes history: Type 2 DM Outpatient Diabetes medications: Levemir 40 units QHS, Novolog 0-20 units TID Current orders for Inpatient glycemic control: Novolog 0-15 units TID, Novolog 0-5 units QHS, Levemir 20 units QHS  Decadron 5 mg x 1 on 5/5  Inpatient Diabetes Program Recommendations:   If to remain inpatient consider increasing Levemir to 24 units QHS.   Thanks,  Bronson Curb, MSN, RNC-OB Diabetes Coordinator [phone] (8a-5p)

## 2018-09-15 NOTE — Consult Note (Addendum)
[place]  [phone] [phone]   MEDICAL ONCOLOGY - INITIAL CONSULTATION  Referral MD: Dr. Hosie Poisson  Reason for Referral: Colorectal adenocarcinoma and lung mass  HPI: Ms. Bollig is a 68 year old female with a past medical history including coronary artery disease, diastolic heart failure, type 2 diabetes mellitus, chronic kidney disease stage III, hypertension, hypothyroidism and chronic lymphedema.  The patient was initially admitted to [facility] with a 5-day history of worsening dyspnea and lower extremity edema.  She was found to be anemic with a hemoglobin of 7.0 on admission.  Ferritin was 4, iron 9, TIBC 431, percent saturation 2%.  She was admitted with a working diagnosis of symptomatic anemia and decompensated heart failure.  The patient was given 2 units of packed red blood cells and IV iron.  She was found to have heme positive stools and GI was consulted.  She underwent an EGD and a colonoscopy on 09/09/2018.  She was found to have a colon mass and biopsies were consistent for adenocarcinoma.  She was transferred to [facility] for consideration of surgery.  The patient underwent staging CT scans of the chest, abdomen, pelvis which demonstrated a central perihilar left upper lobe mass measuring 4.4 cm compatible with malignancy, favor primary bronchogenic carcinoma.  She also has mediastinal adenopathy in the left prevascular/AP window and subcarinal stations compatible with metastatic nodes.  There was an apple core lesion in the ascending colon compatible with the known primary colonic neoplasm.  She has an adjacent top normal size right mesenteric lymph node equivocal for local regional nodal metastasis.  Because of her lung mass, pulmonology was consulted.  She underwent EBUS on 09/14/2018.  Biopsies were obtained and final pathology is pending.  When seen today, the patient reports that she has ongoing fatigue, dyspnea, and  lower extremity edema.  She reports that her appetite has been up and down.  She is unsure if she has lost weight due to her tendency for fluid overload.  Denies headaches and visual changes.  Reports occasional dizziness.  Reports intermittent chest discomfort.  Denies epistaxis, hemoptysis, hematuria, melena, hematochezia.  Denies abdominal discomfort, nausea, vomiting, change in her bowel habits.    The patient has a history of tobacco use and smoked about 1-1/2 packs of cigarettes per day for about 45 years.  She quit 4 years ago.  Denies alcohol use.  The patient is widowed and lives alone.  She has 3 children who are adopted.  2 of them live in [place] and one the lives near her in [place], [place].  Family history significant for cardiac disease.  She reports several cousins with cancers including leukemia, colon cancer, and breast cancer.  States that she has had a screening colonoscopy in the past but has been over 10 years.  She reports her prior colonoscopy is normal.  Medical oncology was asked to see the patient to make recommendations regarding her colorectal adenocarcinoma and lung mass.    Past Medical History:  Diagnosis Date  . Anxiety   . CAD (coronary artery disease)    a. s/p DES to LAD and angioplasty to D1 in 05/2017  . CHF (congestive heart failure) (HCC)    Diastolic  . Chronic back pain   . Chronic pain   . COPD (chronic obstructive pulmonary disease) ([place])   . Degenerative disk disease   . Diabetes mellitus without complication ([place])   . History of medication noncompliance 11/2017   "the patient  frequently self-adjusts medications without notifying her physicians"  . Hypertension   . Hypothyroidism   . On home O2    2.5 L N/C prn  . Pedal edema   :  Past Surgical History:  Procedure Laterality Date  . BIOPSY  09/09/2018   Procedure: BIOPSY;  Surgeon: Daneil Dolin, MD;  [facility];  Service: Endoscopy;;  gastric esophageal hepatic  flexure colon  . COLONOSCOPY WITH PROPOFOL N/A 09/09/2018   Procedure: COLONOSCOPY WITH PROPOFOL;  Surgeon: Daneil Dolin, MD;  [facility];  Service: Endoscopy;  Laterality: N/A;  . CORONARY STENT INTERVENTION N/A 05/22/2017   Procedure: CORONARY STENT INTERVENTION;  Surgeon: Martinique, Peter M, MD;  Location: [place] [facility];  Service: Cardiovascular;  Laterality: N/A;  . ESOPHAGOGASTRODUODENOSCOPY (EGD) WITH PROPOFOL N/A 09/09/2018   Procedure: ESOPHAGOGASTRODUODENOSCOPY (EGD) WITH PROPOFOL;  Surgeon: Daneil Dolin, MD;  [facility];  Service: Endoscopy;  Laterality: N/A;  . HERNIA REPAIR    . POLYPECTOMY  09/09/2018   Procedure: POLYPECTOMY;  Surgeon: Daneil Dolin, MD;  [facility];  Service: Endoscopy;;  colon  . RIGHT/LEFT HEART CATH AND CORONARY ANGIOGRAPHY N/A 05/19/2017   Procedure: RIGHT/LEFT HEART CATH AND CORONARY ANGIOGRAPHY;  Surgeon: Leonie Man, MD;  Location: [place] [facility];  Service: Cardiovascular;  Laterality: N/A;  . TUBAL LIGATION    :  Current Facility-Administered Medications  Medication Dose Route Frequency Provider Last Rate Last Dose  . 0.9 %  sodium chloride infusion  250 mL Intravenous PRN Daneil Dolin, MD   Stopped at 09/11/18 1716  . acetaminophen (TYLENOL) tablet 650 mg  650 mg Oral Q6H PRN Daneil Dolin, MD   650 mg at 09/03/18 0042   Or  . acetaminophen (TYLENOL) suppository 650 mg  650 mg Rectal Q6H PRN Daneil Dolin, MD      . albuterol (PROVENTIL) (2.5 MG/3ML) 0.083% nebulizer solution 3 mL  3 mL Inhalation Q4H PRN Daneil Dolin, MD      . ALPRAZolam Duanne Moron) tablet 1 mg  1 mg Oral TID PRN Daneil Dolin, MD   1 mg at 09/15/18 0904  . aspirin EC tablet 81 mg  81 mg Oral Daily Daneil Dolin, MD   81 mg at 09/15/18 0910  . atorvastatin (LIPITOR) tablet 80 mg  80 mg Oral q morning - 10a Daneil Dolin, MD   80 mg at 09/15/18 0910  . bisacodyl (DULCOLAX) EC tablet 10 mg  10 mg Oral Daily PRN Daneil Dolin, MD   10 mg at 09/06/18 1121  . cholecalciferol (VITAMIN D3) tablet 5,000 Units  5,000 Units Oral Daily Daneil Dolin, MD   5,000 Units at 09/15/18 0910  . furosemide (LASIX) tablet 40 mg  40 mg Oral Daily Arrien, Jimmy Picket, MD   40 mg at 09/15/18 0910  . gabapentin (NEURONTIN) capsule 300 mg  300 mg Oral QID Daneil Dolin, MD   300 mg at 09/15/18 1342  . Gerhardt's butt cream 1 application  1 application Topical TID Daneil Dolin, MD   1 application at 37/62/83 1233  . insulin aspart (novoLOG) injection 0-15 Units  0-15 Units Subcutaneous TID WC Daneil Dolin, MD   5 Units at 09/15/18 1336  . insulin aspart (novoLOG) injection 0-5 Units  0-5 Units Subcutaneous QHS Daneil Dolin, MD   4 Units at 09/14/18 2242  . insulin aspart (novoLOG) injection 5 Units  5 Units Subcutaneous TID WC Daneil Dolin, MD   5 Units at 09/15/18 1337  . insulin detemir (LEVEMIR) injection 20 Units  20 Units Subcutaneous QHS Daneil Dolin, MD   20 Units at 09/14/18 2241  . lactated ringers infusion   Intravenous Continuous Brennan Bailey, MD 10 mL/hr at 09/14/18 1236    . levothyroxine (SYNTHROID) tablet 200 mcg  200 mcg Oral QAC breakfast Daneil Dolin, MD   200 mcg at 09/15/18 8299  . nitroGLYCERIN (NITROSTAT) SL tablet 0.4 mg  0.4 mg Sublingual Q5 min PRN Rourk, Cristopher Estimable, MD      . nystatin (MYCOSTATIN/NYSTOP) topical powder   Topical BID Daneil Dolin, MD      . omega-3 acid ethyl esters (LOVAZA) capsule 1 g  1 g Oral Daily Daneil Dolin, MD   1 g at 09/15/18 0910  . ondansetron (ZOFRAN) tablet 8 mg  8 mg Oral Q8H PRN Daneil Dolin, MD   8 mg at 09/11/18 0913  . oxyCODONE-acetaminophen (PERCOCET/ROXICET) 5-325 MG per tablet 1 tablet  1 tablet Oral Q6H PRN Daneil Dolin, MD   1 tablet at 09/15/18 0631   And  . oxyCODONE (Oxy IR/ROXICODONE) immediate release tablet 5 mg  5 mg Oral Q6H PRN Daneil Dolin, MD   5 mg at 09/15/18 0631  . pantoprazole (PROTONIX) EC tablet 40 mg  40 mg  Oral BID Daneil Dolin, MD   40 mg at 09/15/18 0910  . polyethylene glycol (MIRALAX / GLYCOLAX) packet 17 g  17 g Oral BID Daneil Dolin, MD   17 g at 09/15/18 0910  . senna (SENOKOT) tablet 17.2 mg  2 tablet Oral BID WC Daneil Dolin, MD   17.2 mg at 09/15/18 0910  . sodium chloride flush (NS) 0.9 % injection 3 mL  3 mL Intravenous Q12H Daneil Dolin, MD   3 mL at 09/15/18 0912  . sodium chloride flush (NS) 0.9 % injection 3 mL  3 mL Intravenous PRN Rourk, Cristopher Estimable, MD      . tiZANidine (ZANAFLEX) tablet 2 mg  2 mg Oral Q8H PRN Daneil Dolin, MD   2 mg at 09/11/18 0915  . topiramate (TOPAMAX) tablet 150 mg  150 mg Oral QPM Daneil Dolin, MD   150 mg at 09/14/18 1658  . umeclidinium-vilanterol (ANORO ELLIPTA) 62.5-25 MCG/INH 1 puff  1 puff Inhalation Daily Daneil Dolin, MD   1 puff at 09/15/18 0824     Allergies  Allergen Reactions  . Dilaudid [Hydromorphone Hcl] Itching  . Midodrine Hcl Swelling    After one dose had anaphylactic reaction, had to call EMS.  Marland Kitchen Actifed Cold-Allergy [Chlorpheniramine-Phenylephrine]     "I was sick and red and it didn't agree with me at all"  . Doxycycline Nausea And Vomiting  . Other Cough    Pt states she is allergic to ragweed and that she starts coughing and sneezing like crazy  . Penicillins Hives and Itching    Tolerates Rocephin Has patient had a PCN reaction causing immediate rash, facial/tongue/throat swelling, SOB or lightheadedness with hypotension: Yes Has patient had a PCN reaction causing severe rash involving mucus membranes or skin necrosis: No Has patient had a PCN reaction that required hospitalization Yes Has patient had a PCN reaction occurring within the last 10 years: No If all of the above answers are "NO", then may proceed with Cephalosporin use.   . Reglan [Metoclopramide] Itching  .  Valium [Diazepam] Itching  . Vistaril [Hydroxyzine Hcl] Itching  :  Family History  Problem Relation Age of Onset  . Stroke  Mother        deceased 92   . Heart attack Father        deceased 70   . Diabetes Brother   . Cerebral palsy Brother   . Pneumonia Brother   . Diabetes Other   . Heart attack Other   . Colon cancer Neg Hx   :  Social History   Socioeconomic History  . Marital status: Widowed    Spouse name: Not on file  . Number of children: 3  . Years of education: Not on file  . Highest education level: Not on file  Occupational History    Comment: retired from [employer], worked Careers adviser  . Financial resource strain: Not on file  . Food insecurity:    Worry: Not on file    Inability: Not on file  . Transportation needs:    Medical: Not on file    Non-medical: Not on file  Tobacco Use  . Smoking status: Former Smoker    Packs/day: 1.50    Years: 45.00    Pack years: 67.50    Types: Cigarettes    Last attempt to quit: 11/24/2014    Years since quitting: 3.8  . Smokeless tobacco: Never Used  Substance and Sexual Activity  . Alcohol use: No    Alcohol/week: 0.0 standard drinks  . Drug use: No  . Sexual activity: Not Currently    Birth control/protection: Surgical  Lifestyle  . Physical activity:    Days per week: Not on file    Minutes per session: Not on file  . Stress: Not on file  Relationships  . Social connections:    Talks on phone: Not on file    Gets together: Not on file    Attends religious service: Not on file    Active member of club or organization: Not on file    Attends meetings of clubs or organizations: Not on file    Relationship status: Not on file  . Intimate partner violence:    Fear of current or ex partner: Not on file    Emotionally abused: Not on file    Physically abused: Not on file    Forced sexual activity: Not on file  Other Topics Concern  . Not on file  Social History Narrative   Has 3 adopted children. Two live in [place].  :   Review of systems: A comprehensive review of systems was negative except as noted in the  HPI.  Exam: Patient Vitals for the past 24 hrs:  BP Temp Temp src Pulse Resp SpO2 Weight  09/15/18 0833 - - - - - 98 % -  09/15/18 0549 (!) 127/47 98 F (36.7 C) - 63 18 100 % -  09/15/18 0428 - - - - - - (!) 305 lb 3.2 oz (138.4 kg)  09/14/18 1935 (!) 112/47 98.5 F (36.9 C) Oral 81 18 100 % -  09/14/18 1628 (!) 146/65 98.2 F (36.8 C) Oral 81 18 99 % -  09/14/18 1611 132/74 - - 82 16 100 % -  09/14/18 1555 130/62 (!) 97.3 F (36.3 C) - 80 15 94 % -  09/14/18 1545 (!) 134/57 - - 86 20 97 % -  09/14/18 1530 (!) 147/71 - - 87 17 97 % -  09/14/18 1513 (!) 142/62 (!) 97.2  F (36.2 C) - 86 10 100 % -    General:  well-nourished in no acute distress.  Eyes:  no scleral icterus.  ENT:  There were no oropharyngeal lesions.  Neck was without thyromegaly.  Lymphatics:  Negative cervical, supraclavicular or axillary adenopathy.  Respiratory: Coarse lung sounds throughout the posterior lung fields.  Cardiovascular:  Regular rate and rhythm, S1/S2, without murmur, rub or gallop.  There is 2+ pedal edema. GI:  abdomen was soft, nontender, nondistended, without organomegaly.  Muscoloskeletal:  no spinal tenderness of palpation of vertebral spine.  Skin exam was without echymosis, petichae.  Neuro exam was nonfocal. Patient was alert and oriented.  Attention was good.   Language was appropriate.  Mood was normal without depression.  Speech was not pressured.  Thought content was not tangential.     Lab Results  Component Value Date   WBC 7.0 09/15/2018   HGB 7.6 (L) 09/15/2018   HCT 25.5 (L) 09/15/2018   PLT 267 09/15/2018   GLUCOSE 243 (H) 09/15/2018   CHOL 90 07/06/2018   TRIG 90 07/06/2018   HDL 35 (L) 07/06/2018   LDLCALC 37 07/06/2018   ALT 10 09/02/2018   AST 12 (L) 09/02/2018   NA 137 09/15/2018   K 4.4 09/15/2018   CL 102 09/15/2018   CREATININE 1.35 (H) 09/15/2018   BUN 37 (H) 09/15/2018   CO2 27 09/15/2018    Ct Chest W Contrast  Result Date: 09/11/2018 CLINICAL DATA:   68 year old female inpatient admitted with symptomatic anemia and decompensated heart failure, found to have hepatic flexure colon mass on colonoscopy 09/09/2018. staging evaluation. EXAM: CT CHEST, ABDOMEN, AND PELVIS WITH CONTRAST TECHNIQUE: Multidetector CT imaging of the chest, abdomen and pelvis was performed following the standard protocol during bolus administration of intravenous contrast. CONTRAST:  142mL OMNIPAQUE IOHEXOL 300 MG/ML  SOLN COMPARISON:  08/30/2018 chest radiograph. 01/27/2017 chest CT angiogram. 04/05/2018 CT abdomen/pelvis. FINDINGS: CT CHEST FINDINGS Cardiovascular: Borderline mild cardiomegaly. No significant pericardial effusion/thickening. Left anterior descending coronary atherosclerosis. Atherosclerotic nonaneurysmal thoracic aorta. Dilated main pulmonary artery (3.5 cm diameter). No central pulmonary emboli. Mediastinum/Nodes: No discrete thyroid nodules. Unremarkable esophagus. No axillary adenopathy. Multiple enlarged left prevascular/AP window lymph nodes, largest 1.6 cm (series 3/image 18), new since 01/27/2017 chest CT. New enlarged 2.1 cm subcarinal node (series 3/image 23). No right hilar adenopathy. Lungs/Pleura: No pneumothorax. Trace dependent left pleural effusion. No right pleural effusion. Central perihilar left upper lobe 5.4 x 4.2 cm lung mass (series 3/image 27), new since 01/27/2017 chest CT, with associated high-grade stenosis of the left upper lobe bronchus. Postobstructive consolidation in lingula with some associated volume loss. No significant pulmonary nodules. Mild passive atelectasis in dependent lower lobes. Musculoskeletal: No aggressive appearing focal osseous lesions. Mild thoracic spondylosis. CT ABDOMEN PELVIS FINDINGS Hepatobiliary: Normal liver size. No liver mass. Normal gallbladder with no radiopaque cholelithiasis. No biliary ductal dilatation. Pancreas: Normal, with no mass or duct dilation. Spleen: Normal size. No mass. Adrenals/Urinary Tract:  Left adrenal 2.4 cm nodule with density 14 HU, stable in size since 04/30/2015 CT, compatible with a benign adenoma. Stable appearance of the right adrenal gland without discrete right adrenal nodule. Punctate nonobstructing interpolar right and upper left renal stones. No hydronephrosis. Subcentimeter hypodense renal cortical lesion in the lower left kidney is too small to characterize and is unchanged, considered benign. No additional renal lesions. Normal bladder. Stomach/Bowel: Small hiatal hernia. Otherwise normal nondistended stomach. Normal caliber small bowel with no small bowel wall thickening. Appendix not  discretely visualized. No pericecal inflammatory changes. Oral contrast transits to the right colon. There is an apple-core lesion in the ascending colon measuring 4.0 x 3.9 cm (series 5/image 60) with irregular annular wall thickening. No additional sites of large bowel wall thickening. No significant colonic diverticulosis. No acute pericolonic fat stranding. Moderate diffuse colonic stool volume. Vascular/Lymphatic: Atherosclerotic nonaneurysmal abdominal aorta. Patent portal, splenic, hepatic and renal veins. Top-normal size 0.6 cm right mesenteric node (series 3/image 73). No pathologically enlarged lymph nodes in the abdomen or pelvis. Reproductive: Grossly normal uterus.  No adnexal mass. Other: No pneumoperitoneum, ascites or focal fluid collection. Musculoskeletal: No aggressive appearing focal osseous lesions. Marked lumbar spondylosis. IMPRESSION: 1. Central perihilar left upper lobe 5.4 cm lung mass, new since 01/27/2017 chest CT, compatible with malignancy, favor primary bronchogenic carcinoma. Postobstructive pneumonia/atelectasis in the lingula. 2. Mediastinal adenopathy in the left prevascular/AP window and subcarinal stations, compatible with metastatic nodes (more likely originating from the left upper lobe lung mass). 3. Trace dependent left pleural effusion. 4. Apple-core lesion in  the ascending colon, compatible with known primary colonic neoplasm. Adjacent top-normal size right mesenteric lymph node is equivocal for locoregional nodal metastasis. No additional potential findings of metastatic disease in the abdomen or pelvis. 5. Chronic findings include: Aortic Atherosclerosis (ICD10-I70.0). Borderline mild cardiomegaly. One vessel coronary atherosclerosis. Dilated main pulmonary artery, suggesting pulmonary arterial hypertension. Left adrenal adenoma. Punctate bilateral nephrolithiasis. Small hiatal hernia. Electronically Signed   By: Ilona Sorrel M.D.   On: 09/11/2018 16:42   Ct Abdomen Pelvis W Contrast  Result Date: 09/11/2018 CLINICAL DATA:  69 year old female inpatient admitted with symptomatic anemia and decompensated heart failure, found to have hepatic flexure colon mass on colonoscopy 09/09/2018. staging evaluation. EXAM: CT CHEST, ABDOMEN, AND PELVIS WITH CONTRAST TECHNIQUE: Multidetector CT imaging of the chest, abdomen and pelvis was performed following the standard protocol during bolus administration of intravenous contrast. CONTRAST:  180mL OMNIPAQUE IOHEXOL 300 MG/ML  SOLN COMPARISON:  08/30/2018 chest radiograph. 01/27/2017 chest CT angiogram. 04/05/2018 CT abdomen/pelvis. FINDINGS: CT CHEST FINDINGS Cardiovascular: Borderline mild cardiomegaly. No significant pericardial effusion/thickening. Left anterior descending coronary atherosclerosis. Atherosclerotic nonaneurysmal thoracic aorta. Dilated main pulmonary artery (3.5 cm diameter). No central pulmonary emboli. Mediastinum/Nodes: No discrete thyroid nodules. Unremarkable esophagus. No axillary adenopathy. Multiple enlarged left prevascular/AP window lymph nodes, largest 1.6 cm (series 3/image 18), new since 01/27/2017 chest CT. New enlarged 2.1 cm subcarinal node (series 3/image 23). No right hilar adenopathy. Lungs/Pleura: No pneumothorax. Trace dependent left pleural effusion. No right pleural effusion. Central  perihilar left upper lobe 5.4 x 4.2 cm lung mass (series 3/image 27), new since 01/27/2017 chest CT, with associated high-grade stenosis of the left upper lobe bronchus. Postobstructive consolidation in lingula with some associated volume loss. No significant pulmonary nodules. Mild passive atelectasis in dependent lower lobes. Musculoskeletal: No aggressive appearing focal osseous lesions. Mild thoracic spondylosis. CT ABDOMEN PELVIS FINDINGS Hepatobiliary: Normal liver size. No liver mass. Normal gallbladder with no radiopaque cholelithiasis. No biliary ductal dilatation. Pancreas: Normal, with no mass or duct dilation. Spleen: Normal size. No mass. Adrenals/Urinary Tract: Left adrenal 2.4 cm nodule with density 14 HU, stable in size since 04/30/2015 CT, compatible with a benign adenoma. Stable appearance of the right adrenal gland without discrete right adrenal nodule. Punctate nonobstructing interpolar right and upper left renal stones. No hydronephrosis. Subcentimeter hypodense renal cortical lesion in the lower left kidney is too small to characterize and is unchanged, considered benign. No additional renal lesions. Normal bladder. Stomach/Bowel: Small hiatal hernia.  Otherwise normal nondistended stomach. Normal caliber small bowel with no small bowel wall thickening. Appendix not discretely visualized. No pericecal inflammatory changes. Oral contrast transits to the right colon. There is an apple-core lesion in the ascending colon measuring 4.0 x 3.9 cm (series 5/image 60) with irregular annular wall thickening. No additional sites of large bowel wall thickening. No significant colonic diverticulosis. No acute pericolonic fat stranding. Moderate diffuse colonic stool volume. Vascular/Lymphatic: Atherosclerotic nonaneurysmal abdominal aorta. Patent portal, splenic, hepatic and renal veins. Top-normal size 0.6 cm right mesenteric node (series 3/image 73). No pathologically enlarged lymph nodes in the abdomen or  pelvis. Reproductive: Grossly normal uterus.  No adnexal mass. Other: No pneumoperitoneum, ascites or focal fluid collection. Musculoskeletal: No aggressive appearing focal osseous lesions. Marked lumbar spondylosis. IMPRESSION: 1. Central perihilar left upper lobe 5.4 cm lung mass, new since 01/27/2017 chest CT, compatible with malignancy, favor primary bronchogenic carcinoma. Postobstructive pneumonia/atelectasis in the lingula. 2. Mediastinal adenopathy in the left prevascular/AP window and subcarinal stations, compatible with metastatic nodes (more likely originating from the left upper lobe lung mass). 3. Trace dependent left pleural effusion. 4. Apple-core lesion in the ascending colon, compatible with known primary colonic neoplasm. Adjacent top-normal size right mesenteric lymph node is equivocal for locoregional nodal metastasis. No additional potential findings of metastatic disease in the abdomen or pelvis. 5. Chronic findings include: Aortic Atherosclerosis (ICD10-I70.0). Borderline mild cardiomegaly. One vessel coronary atherosclerosis. Dilated main pulmonary artery, suggesting pulmonary arterial hypertension. Left adrenal adenoma. Punctate bilateral nephrolithiasis. Small hiatal hernia. Electronically Signed   By: Ilona Sorrel M.D.   On: 09/11/2018 16:42   US Venous Img Lower Bilateral  Result Date: 09/08/2018 CLINICAL DATA:  Lower extremity pain EXAM: BILATERAL LOWER EXTREMITY VENOUS DUPLEX ULTRASOUND TECHNIQUE: Doppler venous assessment of the bilateral lower extremity deep venous system was performed, including characterization of spectral flow, compressibility, and phasicity. COMPARISON:  None. FINDINGS: There is complete compressibility of the bilateral common femoral, femoral, and popliteal veins. Doppler analysis demonstrates respiratory phasicity and augmentation of flow with calf compression. No obvious superficial vein or calf vein thrombosis. IMPRESSION: No evidence of lower extremity  DVT. Electronically Signed   By: Marybelle Killings M.D.   On: 09/08/2018 13:27   Dg Chest Portable 1 View  Result Date: 08/30/2018 CLINICAL DATA:  Short of breath EXAM: PORTABLE CHEST 1 VIEW COMPARISON:  07/05/2018 FINDINGS: Mild cardiac enlargement. Negative for heart failure or edema. Mild left lower lobe airspace disease and small left effusion. Right lung base clear. IMPRESSION: Mild left lower lobe atelectasis/infiltrate and small left effusion. Mild progression since the prior study. Electronically Signed   By: Franchot Gallo M.D.   On: 08/30/2018 21:33   Ct Chest W Contrast  Result Date: 09/11/2018 CLINICAL DATA:  68 year old female inpatient admitted with symptomatic anemia and decompensated heart failure, found to have hepatic flexure colon mass on colonoscopy 09/09/2018. staging evaluation. EXAM: CT CHEST, ABDOMEN, AND PELVIS WITH CONTRAST TECHNIQUE: Multidetector CT imaging of the chest, abdomen and pelvis was performed following the standard protocol during bolus administration of intravenous contrast. CONTRAST:  139mL OMNIPAQUE IOHEXOL 300 MG/ML  SOLN COMPARISON:  08/30/2018 chest radiograph. 01/27/2017 chest CT angiogram. 04/05/2018 CT abdomen/pelvis. FINDINGS: CT CHEST FINDINGS Cardiovascular: Borderline mild cardiomegaly. No significant pericardial effusion/thickening. Left anterior descending coronary atherosclerosis. Atherosclerotic nonaneurysmal thoracic aorta. Dilated main pulmonary artery (3.5 cm diameter). No central pulmonary emboli. Mediastinum/Nodes: No discrete thyroid nodules. Unremarkable esophagus. No axillary adenopathy. Multiple enlarged left prevascular/AP window lymph nodes, largest 1.6 cm (series 3/image 18),  new since 01/27/2017 chest CT. New enlarged 2.1 cm subcarinal node (series 3/image 23). No right hilar adenopathy. Lungs/Pleura: No pneumothorax. Trace dependent left pleural effusion. No right pleural effusion. Central perihilar left upper lobe 5.4 x 4.2 cm lung mass (series  3/image 27), new since 01/27/2017 chest CT, with associated high-grade stenosis of the left upper lobe bronchus. Postobstructive consolidation in lingula with some associated volume loss. No significant pulmonary nodules. Mild passive atelectasis in dependent lower lobes. Musculoskeletal: No aggressive appearing focal osseous lesions. Mild thoracic spondylosis. CT ABDOMEN PELVIS FINDINGS Hepatobiliary: Normal liver size. No liver mass. Normal gallbladder with no radiopaque cholelithiasis. No biliary ductal dilatation. Pancreas: Normal, with no mass or duct dilation. Spleen: Normal size. No mass. Adrenals/Urinary Tract: Left adrenal 2.4 cm nodule with density 14 HU, stable in size since 04/30/2015 CT, compatible with a benign adenoma. Stable appearance of the right adrenal gland without discrete right adrenal nodule. Punctate nonobstructing interpolar right and upper left renal stones. No hydronephrosis. Subcentimeter hypodense renal cortical lesion in the lower left kidney is too small to characterize and is unchanged, considered benign. No additional renal lesions. Normal bladder. Stomach/Bowel: Small hiatal hernia. Otherwise normal nondistended stomach. Normal caliber small bowel with no small bowel wall thickening. Appendix not discretely visualized. No pericecal inflammatory changes. Oral contrast transits to the right colon. There is an apple-core lesion in the ascending colon measuring 4.0 x 3.9 cm (series 5/image 60) with irregular annular wall thickening. No additional sites of large bowel wall thickening. No significant colonic diverticulosis. No acute pericolonic fat stranding. Moderate diffuse colonic stool volume. Vascular/Lymphatic: Atherosclerotic nonaneurysmal abdominal aorta. Patent portal, splenic, hepatic and renal veins. Top-normal size 0.6 cm right mesenteric node (series 3/image 73). No pathologically enlarged lymph nodes in the abdomen or pelvis. Reproductive: Grossly normal uterus.  No adnexal  mass. Other: No pneumoperitoneum, ascites or focal fluid collection. Musculoskeletal: No aggressive appearing focal osseous lesions. Marked lumbar spondylosis. IMPRESSION: 1. Central perihilar left upper lobe 5.4 cm lung mass, new since 01/27/2017 chest CT, compatible with malignancy, favor primary bronchogenic carcinoma. Postobstructive pneumonia/atelectasis in the lingula. 2. Mediastinal adenopathy in the left prevascular/AP window and subcarinal stations, compatible with metastatic nodes (more likely originating from the left upper lobe lung mass). 3. Trace dependent left pleural effusion. 4. Apple-core lesion in the ascending colon, compatible with known primary colonic neoplasm. Adjacent top-normal size right mesenteric lymph node is equivocal for locoregional nodal metastasis. No additional potential findings of metastatic disease in the abdomen or pelvis. 5. Chronic findings include: Aortic Atherosclerosis (ICD10-I70.0). Borderline mild cardiomegaly. One vessel coronary atherosclerosis. Dilated main pulmonary artery, suggesting pulmonary arterial hypertension. Left adrenal adenoma. Punctate bilateral nephrolithiasis. Small hiatal hernia. Electronically Signed   By: Ilona Sorrel M.D.   On: 09/11/2018 16:42   Ct Abdomen Pelvis W Contrast  Result Date: 09/11/2018 CLINICAL DATA:  68 year old female inpatient admitted with symptomatic anemia and decompensated heart failure, found to have hepatic flexure colon mass on colonoscopy 09/09/2018. staging evaluation. EXAM: CT CHEST, ABDOMEN, AND PELVIS WITH CONTRAST TECHNIQUE: Multidetector CT imaging of the chest, abdomen and pelvis was performed following the standard protocol during bolus administration of intravenous contrast. CONTRAST:  169mL OMNIPAQUE IOHEXOL 300 MG/ML  SOLN COMPARISON:  08/30/2018 chest radiograph. 01/27/2017 chest CT angiogram. 04/05/2018 CT abdomen/pelvis. FINDINGS: CT CHEST FINDINGS Cardiovascular: Borderline mild cardiomegaly. No  significant pericardial effusion/thickening. Left anterior descending coronary atherosclerosis. Atherosclerotic nonaneurysmal thoracic aorta. Dilated main pulmonary artery (3.5 cm diameter). No central pulmonary emboli. Mediastinum/Nodes: No discrete thyroid nodules. Unremarkable  esophagus. No axillary adenopathy. Multiple enlarged left prevascular/AP window lymph nodes, largest 1.6 cm (series 3/image 18), new since 01/27/2017 chest CT. New enlarged 2.1 cm subcarinal node (series 3/image 23). No right hilar adenopathy. Lungs/Pleura: No pneumothorax. Trace dependent left pleural effusion. No right pleural effusion. Central perihilar left upper lobe 5.4 x 4.2 cm lung mass (series 3/image 27), new since 01/27/2017 chest CT, with associated high-grade stenosis of the left upper lobe bronchus. Postobstructive consolidation in lingula with some associated volume loss. No significant pulmonary nodules. Mild passive atelectasis in dependent lower lobes. Musculoskeletal: No aggressive appearing focal osseous lesions. Mild thoracic spondylosis. CT ABDOMEN PELVIS FINDINGS Hepatobiliary: Normal liver size. No liver mass. Normal gallbladder with no radiopaque cholelithiasis. No biliary ductal dilatation. Pancreas: Normal, with no mass or duct dilation. Spleen: Normal size. No mass. Adrenals/Urinary Tract: Left adrenal 2.4 cm nodule with density 14 HU, stable in size since 04/30/2015 CT, compatible with a benign adenoma. Stable appearance of the right adrenal gland without discrete right adrenal nodule. Punctate nonobstructing interpolar right and upper left renal stones. No hydronephrosis. Subcentimeter hypodense renal cortical lesion in the lower left kidney is too small to characterize and is unchanged, considered benign. No additional renal lesions. Normal bladder. Stomach/Bowel: Small hiatal hernia. Otherwise normal nondistended stomach. Normal caliber small bowel with no small bowel wall thickening. Appendix not discretely  visualized. No pericecal inflammatory changes. Oral contrast transits to the right colon. There is an apple-core lesion in the ascending colon measuring 4.0 x 3.9 cm (series 5/image 60) with irregular annular wall thickening. No additional sites of large bowel wall thickening. No significant colonic diverticulosis. No acute pericolonic fat stranding. Moderate diffuse colonic stool volume. Vascular/Lymphatic: Atherosclerotic nonaneurysmal abdominal aorta. Patent portal, splenic, hepatic and renal veins. Top-normal size 0.6 cm right mesenteric node (series 3/image 73). No pathologically enlarged lymph nodes in the abdomen or pelvis. Reproductive: Grossly normal uterus.  No adnexal mass. Other: No pneumoperitoneum, ascites or focal fluid collection. Musculoskeletal: No aggressive appearing focal osseous lesions. Marked lumbar spondylosis. IMPRESSION: 1. Central perihilar left upper lobe 5.4 cm lung mass, new since 01/27/2017 chest CT, compatible with malignancy, favor primary bronchogenic carcinoma. Postobstructive pneumonia/atelectasis in the lingula. 2. Mediastinal adenopathy in the left prevascular/AP window and subcarinal stations, compatible with metastatic nodes (more likely originating from the left upper lobe lung mass). 3. Trace dependent left pleural effusion. 4. Apple-core lesion in the ascending colon, compatible with known primary colonic neoplasm. Adjacent top-normal size right mesenteric lymph node is equivocal for locoregional nodal metastasis. No additional potential findings of metastatic disease in the abdomen or pelvis. 5. Chronic findings include: Aortic Atherosclerosis (ICD10-I70.0). Borderline mild cardiomegaly. One vessel coronary atherosclerosis. Dilated main pulmonary artery, suggesting pulmonary arterial hypertension. Left adrenal adenoma. Punctate bilateral nephrolithiasis. Small hiatal hernia. Electronically Signed   By: Ilona Sorrel M.D.   On: 09/11/2018 16:42   US Venous Img Lower  Bilateral  Result Date: 09/08/2018 CLINICAL DATA:  Lower extremity pain EXAM: BILATERAL LOWER EXTREMITY VENOUS DUPLEX ULTRASOUND TECHNIQUE: Doppler venous assessment of the bilateral lower extremity deep venous system was performed, including characterization of spectral flow, compressibility, and phasicity. COMPARISON:  None. FINDINGS: There is complete compressibility of the bilateral common femoral, femoral, and popliteal veins. Doppler analysis demonstrates respiratory phasicity and augmentation of flow with calf compression. No obvious superficial vein or calf vein thrombosis. IMPRESSION: No evidence of lower extremity DVT. Electronically Signed   By: Marybelle Killings M.D.   On: 09/08/2018 13:27   Dg Chest Portable 1  View  Result Date: 08/30/2018 CLINICAL DATA:  Short of breath EXAM: PORTABLE CHEST 1 VIEW COMPARISON:  07/05/2018 FINDINGS: Mild cardiac enlargement. Negative for heart failure or edema. Mild left lower lobe airspace disease and small left effusion. Right lung base clear. IMPRESSION: Mild left lower lobe atelectasis/infiltrate and small left effusion. Mild progression since the prior study. Electronically Signed   By: Franchot Gallo M.D.   On: 08/30/2018 21:33   Pathology:  Diagnosis 1. Stomach, biopsy - GASTRIC ANTRAL MUCOSA WITH MILD NONSPECIFIC REACTIVE GASTROPATHY - GASTRIC OXYNTIC MUCOSA WITH PARIETAL CELL HYPERPLASIA AS CAN BE SEEN IN HYPERGASTRINEMIC STATES SUCH AS PROPER PATIENT IDENTIFICATION THERAPY. - WARTHIN STARRY STAIN IS NEGATIVE FOR HELICOBACTER PYLORI 2. Esophagus, biopsy - BARRETT'S ESOPHAGUS, NEGATIVE FOR DYSPLASIA 3. Colon, polyp(s), descending - TUBULAR ADENOMA - NEGATIVE FOR HIGH-GRADE DYSPLASIA OR MALIGNANCY 4. Colon, biopsy, hepatic flexure - ADENOCARCINOMA. SEE NOTE 5. Colon, polyp(s), splenic flexure - ADENOCARCINOMA. SEE NOTE - OTHER FRAGMENTS OF TUBULAR ADENOMA(S) WITH ONE SHOWING HIGH-GRADE DYSPLASIA 6. Rectum, polyp(s) - TUBULAR ADENOMA(S) -  NEGATIVE FOR HIGH-GRADE DYSPLASIA OR MALIGNANCY  Assessment and Plan:  1.  Colorectal adenocarcinoma 2.  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  Immunohistochemical staining is pending. 3.  Iron deficiency anemia 4.  Coronary artery disease 5.  Congestive heart failure 6 diabetes mellitus 7.  Hypertension 8.  Hypothyroidism  -Pathology from colonoscopy reviewed and preliminary pathology from lung mass reviewed with the patient. -Discussed that her lung cancer would be the priority in terms of treatment.  Need to confirm pathology. -We discussed treatment including chemotherapy consisting of carboplatin and etoposide.  Adverse effects of this treatment were discussed with the patient including but not limited to alopecia, nausea and vomiting, myelosuppression, liver and kidney dysfunction.  The patient expresses interest in pursuing treatment once we have confirmed the diagnosis.  She would like to have the first cycle of chemotherapy as an inpatient and then have outpatient follow-up at [facility] which is closer to her home. -We will discuss with Dr. Burr Medico need for MRI of the brain to complete her staging work-up. -The patient remains anemic.  She has had only 1 dose of Feraheme and will repeat this to optimize her hemoglobin.  Thank you for this referral.   Mikey Bussing, DNP, AGPCNP-BC, AOCNP  Addendum  I have seen the patient, examined her. I agree with the assessment and and plan and have edited the notes.   68 yo female with multiple comorbidities and morbid obesity, was recently diagnosed with colon and lung cancer. CT scan showed no evidence of distance metastasis. Unfortunately she is not a candidate for colon cancer surgery due to very high risk of surgical mortality. I discussed the incurable nature of her colon cancer without surgery, and the aggressiveness of small cell lung cancer, and very high risk of recurrence even with  curative intent concurrent chemoradiation. We discussed treatment options for her small cell lung cancer, pt expressed her wishes to get cancer treatment to prolong her life. Due to the much more aggressive nature of small cell lung cancer than colon cancer, I recommend her to start chemo carboplatin and etoposide in the next few days for her lung cancer, then she can f/u with Dr. Delton Coombes at [facility] for subsequent treatment. I will wait for her final lung biopsy results tomorrow, and then transfer her to [facility] to start chemo. Will do a brain MRI if it's small cell cancer. Will give her second  dose feraheme tomorrow for her iron deficient anemia. I spoke with Dr. Karleen Hampshire today.   Truitt Merle  09/15/2018

## 2018-09-15 NOTE — Anesthesia Postprocedure Evaluation (Signed)
Anesthesia Post Note  Patient: Stephanie Combs  Procedure(s) Performed: VIDEO BRONCHOSCOPY WITH ENDOBRONCHIAL ULTRASOUND with biopsies (Left Chest)     Patient location during evaluation: PACU Anesthesia Type: General Level of consciousness: sedated and patient cooperative Pain management: pain level controlled Vital Signs Assessment: post-procedure vital signs reviewed and stable Respiratory status: spontaneous breathing Cardiovascular status: stable Anesthetic complications: no    Last Vitals:  Vitals:   09/15/18 0549 09/15/18 0833  BP: (!) 127/47   Pulse: 63   Resp: 18   Temp: 36.7 C   SpO2: 100% 98%    Last Pain:  Vitals:   09/15/18 0935  TempSrc:   PainSc: [place]

## 2018-09-15 NOTE — Consult Note (Signed)
NAME:  Stephanie Combs, MRN:  [id number], DOB:  [birth year], LOS: 74 ADMISSION DATE:  08/30/2018, CONSULTATION DATE:  5/4 REFERRING MD:  arrien, CHIEF COMPLAINT:  Lung mass   Brief History   68 year old female patient who was transferred to [facility] from [facility] on 5/2 for surgical evaluation of colonic mass.  During staging evaluation was found also have left perihilar lung mass with mediastinal adenopathy by CT scan on 5/2 because of this pulmonary consulted History of present illness   68 year old female who was admitted initially w/ 5d h/o worsening shortness of breath and LE swelling. This was refractory to oral diuretic titration so presented to ER. ED eval: L lung effusion/atx, hgb 7. She was admitted w/ working dx of decompensated diastolic HF, lower extremity cellulitis and symptomatic anemia.  She was transfused 2 units of blood, aggressively diuresed, 14 L off since admission.  Ultimately had GI consultation due to heme positive stool and underwent EGD and colonoscopy on April 30 which demonstrated colonic mass.  A biopsy was obtained.  A CT of chest was obtained at the direction of general surgery consultation on 5/2 this demonstrated 5 cm mass in the left perihilar region for which pulmonary was consulted. -Lives independently -At baseline can ambulate about 70 feet prior to dyspnea -Stop smoking about 4 and half years ago -Retired used to be a Museum/gallery conservator -does have famil;y h/o lung cancer Past Medical History  CAD, chronic diastolic HF, obesity, CKD III, HTN, chronic lymphedema, hypothyroidism  Significant Hospital Events   4/20 admitted with decompensated heart failure, symptomatic anemia.  Has fused and aggressively diuresed. 4/28: GI consulted for heme positive stool 4/30: EGD and colonoscopy: Showed colonic mass biopsies taken mass tattooed 5/2: Transferred to [facility] seen by general surgery CT chest ordered for staging purposes 5/4: Pulmonary  asked to see due to left perihilar mass.  Feeling better from pulmonary standpoint, has diuresed 15.7 L 5/5: EBUS Consults:  GI medicine at [facility] General surgery Pulmonary  Procedures:  EGD and colonoscopy 4/30  Significant Diagnostic Tests:  CT chest and abdomen 5/2: Central perihilar left upper lobe lung mass 5.4 cm in measurement mediastinal adenopathy in the left prevascular AP window and subcarinal station apple core lesion of the ascending colon compatible with known colonic mass  Micro Data:    Antimicrobials:     Interim history/subjective:  S/p bronchoscopy. Reports small clotted hemoptysis. No frank blood. Mild chest pain otherwise no complaints.   She has not discussed her colon mass results yet.  Objective   Blood pressure (!) 127/47, pulse 63, temperature 98 F (36.7 C), resp. rate 18, height 5\' 6"  (1.676 m), weight (!) 138.4 kg, SpO2 98 %.        Intake/Output Summary (Last 24 hours) at 09/15/2018 1014 Last data filed at 09/15/2018 0741 Gross per 24 hour  Intake 1563 ml  Output 1125 ml  Net 438 ml   Filed Weights   09/13/18 0629 09/14/18 0507 09/15/18 0428  Weight: (!) 138 kg (!) 137.9 kg (!) 138.4 kg   Physical Exam: General: Morbidly obese, no acute distress, wearing nasal cannula HENT: [place], AT, OP clear, MMM Eyes: EOMI, no scleral icterus Respiratory: Clear to auscultation bilaterally.  No crackles, wheezing or rales Cardiovascular: RRR, -M/R/G, no JVD GI: BS+, soft, nontender Extremities:Chronic lymphedema, mild tenderness to palpation Neuro: AAO x4, CNII-XII grossly intact Psych: Normal mood, normal affect  Resolved Hospital Problem list   Lower extremity cellulitis  Assessment & Plan:  Left upper perihilar lung mass with lymphadenopathy Concerning for second primary malignancy S/p EBUS on 5/5 Evaluated patient post-procedure: Stable Plan -Pathology pending -Final results will take 3-5 business days. . -OK to discharge home from Pulmonary  standpoint. If inpatient, will defer to primary team.   Colonic mass  Seen by surgery and felt not a good surgical candidate Plan -Awaiting discussion of colon path results  Remainder per primary team Pulmonary will be available as needed. I will contact patient with lung path results. Will sign off.  Labs   CBC: Recent Labs  Lab 09/09/18 0425 09/12/18 0458 09/15/18 0514  WBC 8.2 8.0 7.0  HGB 8.6* 7.7* 7.6*  HCT 29.8* 26.9* 25.5*  MCV 87.1 86.8 86.1  PLT 303 267 502    Basic Metabolic Panel: Recent Labs  Lab 09/09/18 0425 09/11/18 1352 09/12/18 0458 09/13/18 0554 09/14/18 0354 09/15/18 0514  NA 138 133* 135 135 136 137  K 4.1 4.4 4.3 4.4 3.9 4.4  CL 100 96* 98 99 96* 102  CO2 28 27 30 28 29 27   GLUCOSE 143* 219* 130* 197* 188* 243*  BUN 43* 31* 29* 35* 42* 37*  CREATININE 1.18* 1.38* 1.29* 1.44* 1.43* 1.35*  CALCIUM 8.8* 8.5* 8.6* 8.4* 8.9 8.8*  PHOS 4.1  --   --   --   --   --    GFR: Estimated Creatinine Clearance: 57.2 mL/min (A) (by C-G formula based on SCr of 1.35 mg/dL (H)). Recent Labs  Lab 09/09/18 0425 09/12/18 0458 09/15/18 0514  WBC 8.2 8.0 7.0    Liver Function Tests: Recent Labs  Lab 09/09/18 0425  ALBUMIN 3.3*   No results for input(s): LIPASE, AMYLASE in the last 168 hours. No results for input(s): AMMONIA in the last 168 hours.  ABG    Component Value Date/Time   PHART 7.325 (L) 01/08/2018 2115   PCO2ART 48.4 (H) 01/08/2018 2115   PO2ART 109.0 (H) 01/08/2018 2115   HCO3 23.6 01/08/2018 2115   TCO2 31 05/19/2017 1602   TCO2 30 05/19/2017 1602   ACIDBASEDEF 0.7 01/08/2018 2115   O2SAT 97.9 01/08/2018 2115     Coagulation Profile: No results for input(s): INR, PROTIME in the last 168 hours.  Cardiac Enzymes: No results for input(s): CKTOTAL, CKMB, CKMBINDEX, TROPONINI in the last 168 hours.  HbA1C: Hemoglobin A1C  Date/Time Value Ref Range Status  11/11/2016 7.5  Final  08/09/2016 9.6  Final   Hgb A1c MFr Bld   Date/Time Value Ref Range Status  08/30/2018 09:15 PM 7.5 (H) 4.8 - 5.6 % Final    Comment:    (NOTE)         Prediabetes: 5.7 - 6.4         Diabetes: >6.4         Glycemic control for adults with diabetes: <7.0   07/05/2018 11:38 AM 7.9 (H) 4.8 - 5.6 % Final    Comment:    (NOTE) Pre diabetes:          5.7%-6.4% Diabetes:              >6.4% Glycemic control for   <7.0% adults with diabetes     CBG: Recent Labs  Lab 09/14/18 1202 09/14/18 1514 09/14/18 1624 09/14/18 2057 09/15/18 0623  GLUCAP 163* 148* 181* 307* 224*   Greater than 50% of this patient 25-minute visit was spent face-to-face in counseling with the patient/family for medical issues noted above.  Rodman Pickle, M.D.  [place] Pager: [phone] After hours pager: [phone]

## 2018-09-15 NOTE — Progress Notes (Signed)
PROGRESS NOTE    Stephanie Combs  YOV:[id number] DOB: 1950-09-20 DOA: 08/30/2018 PCP: [name], MD    Brief Narrative:  68 year old female who presented with dyspnea and lower extremity edema. Does have significant past medical history for coronary artery disease, diastolic heart failure, type 2 diabetes mellitus, chronic kidney disease stage III, hypertension, hypothyroidism and chronic lymphedema. She reported 5 days of worsening dyspnea along with lower extremity edema and dry cough. Symptoms were refractory to increased dose of oral furosemide. On her initial physical examination she was afebrile, pulse rate 84, respirate 24, blood pressure 130/100, oxygenation 100% on supplemental oxygen. Her lungs are clear to auscultation bilaterally, heart S1-S2 present, rhythmic, no gallops, rubs or murmurs, the abdomen was protuberant, nontender, no lower extremity edema. Sodium 138, potassium 4.2, chloride 102, bicarb 27, glucose 241, BUN 23, creatinine 1.58, AST 13, ALT 11, iron 9, TIBC 431, transferrin saturation 2, ferritin 4, white count 7.7, hemoglobin 7.0, hematocrit 35.0, platelets 377.  Patient was admitted to the hospital with a working diagnosis of symptomatic anemia and decompensated heart failure.  Patient received 2 units packed red blood cells with improvement of her hemoglobin and hematocrit.  Patient was successfully diuresed with IV furosemide, with improvement of her symptoms.  Further work-up with colonoscopy April 30 showed a colonic mass. [facility] for surgical evaluation.  Patient was found to have a large left hilar mass, consulted pulmonary and patient underwent bronchoscopy today plus biopsy.    Assessment & Plan:   Principal Problem:   Anemia Active Problems:   DM type 2 (diabetes mellitus, type 2) (HCC)   HTN (hypertension), benign   CKD (chronic kidney disease), stage III (HCC)   Hypothyroidism   Bilateral lower  extremity edema   Uncontrolled type 2 diabetes mellitus with stage 3 chronic kidney disease (HCC)   Obesity, Class III, BMI 40-49.9 (morbid obesity) (HCC)   CAD (coronary artery disease)   Symptomatic anemia   Acute on chronic diastolic CHF (congestive heart failure) (HCC)   Heme positive stool   Non-intractable vomiting   Abdominal pain, epigastric   Pain of lower extremity   Colonic mass   Colon polyps   Cellulitis of leg, left    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 Pt is not a candidate for surgery .  Oncology consulted and recommendations given.    New Hilar mass on the left s/p bronchoscopy and lung biopsy.    Acute on chronic diastolic heart failure:  She appears to be compensated .  Cardiology consulted and recommendations given . Currently on lasix 40 mg daily.    Left leg cellulitis;  Completed the course of antibiotics.    Morbid Obesity and hyperlipidemia:  Recommend outpatient follow up with PCP.    Type 2 Diabetes Mellitus:  CBG (last 3)  Recent Labs    09/14/18 2057 09/15/18 0623 09/15/18 1225  GLUCAP 307* 224* 212*   Increase the levemir , continue with SSI.    Hypothyroidism:  Resume synthroid.    DVT prophylaxis: Heparin.  Code Status: DNR Family Communication: none at bedside. Disposition Plan: pending further work by oncology.   Consultants:   Cardiology  Oncology Dr Burr Medico  Pulmonology Dr Elsworth Soho  surgery    Procedures: lung biopsy by pulmonology  EGD  Colonoscopy.   Bronchoscopy.    Antimicrobials: none.    Subjective: Wants  to know when she can start chemotherapy.   Objective: Vitals:   09/14/18 1935 09/15/18 0428 09/15/18 0549 09/15/18 0833  BP: (!) 112/47  (!) 127/47   Pulse: 81  63   Resp: 18  18   Temp: 98.5 F (36.9 C)  98 F (36.7 C)   TempSrc: Oral     SpO2: 100%  100% 98%  Weight:   (!) 138.4 kg    Height:        Intake/Output Summary (Last 24 hours) at 09/15/2018 1547 Last data filed at 09/15/2018 1300 Gross per 24 hour  Intake 925 ml  Output 1376 ml  Net -451 ml   Filed Weights   09/13/18 0629 09/14/18 0507 09/15/18 0428  Weight: (!) 138 kg (!) 137.9 kg (!) 138.4 kg    Examination:  General exam: Appears calm and comfortable , on 2l it of [place] oxygen.  Respiratory system: Clear to auscultation. Respiratory effort normal. Cardiovascular system: S1 & S2 heard, RRR.  Gastrointestinal system: Abdomen is nondistended, soft and nontender. . Normal bowel sounds heard. Central nervous system: Alert and oriented. No focal neurological deficits. Extremities: Symmetric 5 x 5 power. Skin: No rashes, lesions or ulcers Psychiatry:. Mood & affect appropriate.     Data Reviewed: I have personally reviewed following labs and imaging studies  CBC: Recent Labs  Lab 09/09/18 0425 09/12/18 0458 09/15/18 0514  WBC 8.2 8.0 7.0  HGB 8.6* 7.7* 7.6*  HCT 29.8* 26.9* 25.5*  MCV 87.1 86.8 86.1  PLT 303 267 976   Basic Metabolic Panel: Recent Labs  Lab 09/09/18 0425 09/11/18 1352 09/12/18 0458 09/13/18 0554 09/14/18 0354 09/15/18 0514  NA 138 133* 135 135 136 137  K 4.1 4.4 4.3 4.4 3.9 4.4  CL 100 96* 98 99 96* 102  CO2 28 27 30 28 29 27   GLUCOSE 143* 219* 130* 197* 188* 243*  BUN 43* 31* 29* 35* 42* 37*  CREATININE 1.18* 1.38* 1.29* 1.44* 1.43* 1.35*  CALCIUM 8.8* 8.5* 8.6* 8.4* 8.9 8.8*  PHOS 4.1  --   --   --   --   --    GFR: Estimated Creatinine Clearance: 57.2 mL/min (A) (by C-G formula based on SCr of 1.35 mg/dL (H)). Liver Function Tests: Recent Labs  Lab 09/09/18 0425  ALBUMIN 3.3*   No results for input(s): LIPASE, AMYLASE in the last 168 hours. No results for input(s): AMMONIA in the last 168 hours. Coagulation Profile: No results for input(s): INR, PROTIME in the last 168 hours. Cardiac Enzymes: No results for input(s): CKTOTAL, CKMB,  CKMBINDEX, TROPONINI in the last 168 hours. BNP (last 3 results) No results for input(s): PROBNP in the last 8760 hours. HbA1C: No results for input(s): HGBA1C in the last 72 hours. CBG: Recent Labs  Lab 09/14/18 1514 09/14/18 1624 09/14/18 2057 09/15/18 0623 09/15/18 1225  GLUCAP 148* 181* 307* 224* 212*   Lipid Profile: No results for input(s): CHOL, HDL, LDLCALC, TRIG, CHOLHDL, LDLDIRECT in the last 72 hours. Thyroid Function Tests: No results for input(s): TSH, T4TOTAL, FREET4, T3FREE, THYROIDAB in the last 72 hours. Anemia Panel: No results for input(s): VITAMINB12, FOLATE, FERRITIN, TIBC, IRON, RETICCTPCT in the last 72 hours. Sepsis Labs: No results for input(s): PROCALCITON, LATICACIDVEN in the last 168 hours.  Recent Results (from the past 240 hour(s))  Culture, respiratory     Status: None (Preliminary result)   Collection Time: 09/14/18  1:37 PM  Result Value Ref Range Status   Specimen Description BRONCHIAL  ALVEOLAR LAVAGE  Final   Special Requests NONE  Final   Gram Stain   Final    FEW WBC PRESENT,BOTH PMN AND MONONUCLEAR NO ORGANISMS SEEN    Culture   Final    NO GROWTH < 24 HOURS Performed at [facility], [place] [address] [place], [place] [address]    Report Status PENDING  Incomplete  Acid Fast Smear (AFB)     Status: None   Collection Time: 09/14/18  1:37 PM  Result Value Ref Range Status   AFB Specimen Processing Concentration  Final   Acid Fast Smear Negative  Final    Comment: (NOTE) Performed At: [facility] [place], [name] [id number] Rush Farmer MD WE:[id number]    Source (AFB) BRONCHIAL ALVEOLAR LAVAGE  Final    Comment: Performed at [facility], [place] [address] [place], [place] [address]         Radiology Studies: No results found.      Scheduled Meds: . aspirin EC  81 mg Oral Daily  . atorvastatin  80 mg Oral q morning - 10a  . cholecalciferol  5,000 Units Oral Daily  . furosemide   40 mg Oral Daily  . gabapentin  300 mg Oral QID  . Gerhardt's butt cream  1 application Topical TID  . insulin aspart  0-15 Units Subcutaneous TID WC  . insulin aspart  0-5 Units Subcutaneous QHS  . insulin aspart  5 Units Subcutaneous TID WC  . insulin detemir  20 Units Subcutaneous QHS  . levothyroxine  200 mcg Oral QAC breakfast  . nystatin   Topical BID  . omega-3 acid ethyl esters  1 g Oral Daily  . pantoprazole  40 mg Oral BID  . polyethylene glycol  17 g Oral BID  . senna  2 tablet Oral BID WC  . sodium chloride flush  3 mL Intravenous Q12H  . topiramate  150 mg Oral QPM  . umeclidinium-vilanterol  1 puff Inhalation Daily   Continuous Infusions: . sodium chloride Stopped (09/11/18 1716)  . [START ON 09/16/2018] ferumoxytol    . lactated ringers 10 mL/hr at 09/14/18 1236     LOS: 15 days    Time spent: 35 minutes.     Hosie Poisson, MD [facility] Pager [phone]  If 7PM-7AM, please contact night-coverage [email or web address] Password [facility] 09/15/2018, 3:47 PM

## 2018-09-16 ENCOUNTER — Inpatient Hospital Stay (HOSPITAL_COMMUNITY): Payer: Medicare Other

## 2018-09-16 LAB — GLUCOSE, CAPILLARY
Glucose-Capillary: 243 mg/dL — ABNORMAL HIGH (ref 70–99)
Glucose-Capillary: 176 mg/dL — ABNORMAL HIGH (ref 70–99)
Glucose-Capillary: 176 mg/dL — ABNORMAL HIGH (ref 70–99)
Glucose-Capillary: 226 mg/dL — ABNORMAL HIGH (ref 70–99)
Glucose-Capillary: 214 mg/dL — ABNORMAL HIGH (ref 70–99)

## 2018-09-16 NOTE — Consult Note (Signed)
[facility] Nurse wound consult note Patient receiving care in [place].  I spoke with the Unit Secretary, Alinda Sierras, via telephone.  She explained that the only thing the nurses needed for this patient was an order for Interdry.  The provider put it in as a [place] consult.  I have entered the order as a wound care .Interdry smart phrase. Reason for Consult: MASD for intertriginous dermatitis.  The item Kellie Simmering number is: [phone] Measure and cut length of InterDry Ag+ to fit in skin folds that have skin breakdown Tuck InterDry  Ag+ fabric into skin folds in a single layer, allow for 2 inches of overhang from skin edges to allow for wicking to occur May remove to bathe; dry area thoroughly and then tuck into affected areas again Do not apply any creams or ointments when using InterDry Ag+ DO NOT THROW AWAY FOR 5 DAYS unless soiled with stool DO NOT [facility] product, this will inactivate the silver in the material  New sheet of Interdry Ag+ should be applied after 5 days of use if patient continues to have skin breakdown  Monitor the area(s) for worsening of condition such as: Signs/symptoms of infection,  Increase in size,  Development of or worsening of odor, Development of pain, or increased pain at the affected locations.  Notify the medical team if any of these develop.  Thank you for the consult.  [place] nurse will not follow at this time.  Please re-consult the [place] team if needed.  Val Riles, RN, MSN, CWOCN, CNS-BC, pager [phone]

## 2018-09-16 NOTE — Progress Notes (Addendum)
HEMATOLOGY-ONCOLOGY PROGRESS NOTE  SUBJECTIVE: Patient reports increased congestion today.  Received IV iron earlier today.  Tolerated this well.  She has ongoing lower extremity edema.  She reports no other complaints.  REVIEW OF SYSTEMS:   Constitutional: Reports fatigue.  Denies fevers and chills. Eyes: Denies blurriness of vision Ears, nose, mouth, throat, and face: Denies mucositis or sore throat Respiratory: Reports cough and congestion. Cardiovascular: Denies palpitation, chest discomfort Gastrointestinal:  Denies nausea, heartburn or change in bowel habits Skin: Denies abnormal skin rashes Lymphatics: Denies new lymphadenopathy or easy bruising Neurological:Denies numbness, tingling or new weaknesses Behavioral/Psych: Mood is stable, no new changes  Extremities: Has ongoing lower extremity edema. All other systems were reviewed with the patient and are negative.  I have reviewed the past medical history, past surgical history, social history and family history with the patient and they are unchanged from previous note.   PHYSICAL EXAMINATION:  Vitals:   09/16/18 0844 09/16/18 1122  BP:  (!) 118/46  Pulse:  65  Resp:  19  Temp:  98.3 F (36.8 C)  SpO2: 97% 99%   Filed Weights   09/14/18 0507 09/15/18 0428 09/16/18 0618  Weight: (!) 304 lb 1.6 oz (137.9 kg) (!) 305 lb 3.2 oz (138.4 kg) (!) 307 lb 3.2 oz (139.3 kg)    Intake/Output from previous day: 05/06 0701 - 05/07 0700 In: 945 [P.O.:942; I.V.:3] Out: 851 [Urine:850; Stool:1]  GENERAL:alert, no distress and comfortable SKIN: skin color, texture, turgor are normal, no rashes or significant lesions EYES: normal, Conjunctiva are pink and non-injected, sclera clear OROPHARYNX:no exudate, no erythema and lips, buccal mucosa, and tongue normal  NECK: supple, thyroid normal size, non-tender, without nodularity LYMPH:  no palpable lymphadenopathy in the cervical, axillary or inguinal LUNGS: Normal breathing effort.   Expiratory wheezes noted. HEART: regular rate & rhythm and no murmurs.  2+ lower extremity edema. ABDOMEN:abdomen soft, non-tender and normal bowel sounds Musculoskeletal:no cyanosis of digits and no clubbing  NEURO: alert & oriented x 3 with fluent speech, no focal motor/sensory deficits  LABORATORY DATA:  I have reviewed the data as listed CMP Latest Ref Rng & Units 09/15/2018 09/14/2018 09/13/2018  Glucose 70 - 99 mg/dL 243(H) 188(H) 197(H)  BUN 8 - 23 mg/dL 37(H) 42(H) 35(H)  Creatinine 0.44 - 1.00 mg/dL 1.35(H) 1.43(H) 1.44(H)  Sodium 135 - 145 mmol/L 137 136 135  Potassium 3.5 - 5.1 mmol/L 4.4 3.9 4.4  Chloride 98 - 111 mmol/L 102 96(L) 99  CO2 22 - 32 mmol/L 27 29 28   Calcium 8.9 - 10.3 mg/dL 8.8(L) 8.9 8.4(L)  Total Protein 6.5 - 8.1 g/dL - - -  Total Bilirubin 0.3 - 1.2 mg/dL - - -  Alkaline Phos 38 - 126 U/L - - -  AST 15 - 41 U/L - - -  ALT 0 - 44 U/L - - -    Lab Results  Component Value Date   WBC 7.0 09/15/2018   HGB 7.6 (L) 09/15/2018   HCT 25.5 (L) 09/15/2018   MCV 86.1 09/15/2018   PLT 267 09/15/2018   NEUTROABS 4.0 09/04/2018    Ct Chest W Contrast  Result Date: 09/11/2018 CLINICAL DATA:  68 year old female inpatient admitted with symptomatic anemia and decompensated heart failure, found to have hepatic flexure colon mass on colonoscopy 09/09/2018. staging evaluation. EXAM: CT CHEST, ABDOMEN, AND PELVIS WITH CONTRAST TECHNIQUE: Multidetector CT imaging of the chest, abdomen and pelvis was performed following the standard protocol during bolus administration of intravenous contrast. CONTRAST:  163mL OMNIPAQUE IOHEXOL 300 MG/ML  SOLN COMPARISON:  08/30/2018 chest radiograph. 01/27/2017 chest CT angiogram. 04/05/2018 CT abdomen/pelvis. FINDINGS: CT CHEST FINDINGS Cardiovascular: Borderline mild cardiomegaly. No significant pericardial effusion/thickening. Left anterior descending coronary atherosclerosis. Atherosclerotic nonaneurysmal thoracic aorta. Dilated main pulmonary  artery (3.5 cm diameter). No central pulmonary emboli. Mediastinum/Nodes: No discrete thyroid nodules. Unremarkable esophagus. No axillary adenopathy. Multiple enlarged left prevascular/AP window lymph nodes, largest 1.6 cm (series 3/image 18), new since 01/27/2017 chest CT. New enlarged 2.1 cm subcarinal node (series 3/image 23). No right hilar adenopathy. Lungs/Pleura: No pneumothorax. Trace dependent left pleural effusion. No right pleural effusion. Central perihilar left upper lobe 5.4 x 4.2 cm lung mass (series 3/image 27), new since 01/27/2017 chest CT, with associated high-grade stenosis of the left upper lobe bronchus. Postobstructive consolidation in lingula with some associated volume loss. No significant pulmonary nodules. Mild passive atelectasis in dependent lower lobes. Musculoskeletal: No aggressive appearing focal osseous lesions. Mild thoracic spondylosis. CT ABDOMEN PELVIS FINDINGS Hepatobiliary: Normal liver size. No liver mass. Normal gallbladder with no radiopaque cholelithiasis. No biliary ductal dilatation. Pancreas: Normal, with no mass or duct dilation. Spleen: Normal size. No mass. Adrenals/Urinary Tract: Left adrenal 2.4 cm nodule with density 14 HU, stable in size since 04/30/2015 CT, compatible with a benign adenoma. Stable appearance of the right adrenal gland without discrete right adrenal nodule. Punctate nonobstructing interpolar right and upper left renal stones. No hydronephrosis. Subcentimeter hypodense renal cortical lesion in the lower left kidney is too small to characterize and is unchanged, considered benign. No additional renal lesions. Normal bladder. Stomach/Bowel: Small hiatal hernia. Otherwise normal nondistended stomach. Normal caliber small bowel with no small bowel wall thickening. Appendix not discretely visualized. No pericecal inflammatory changes. Oral contrast transits to the right colon. There is an apple-core lesion in the ascending colon measuring 4.0 x 3.9 cm  (series 5/image 60) with irregular annular wall thickening. No additional sites of large bowel wall thickening. No significant colonic diverticulosis. No acute pericolonic fat stranding. Moderate diffuse colonic stool volume. Vascular/Lymphatic: Atherosclerotic nonaneurysmal abdominal aorta. Patent portal, splenic, hepatic and renal veins. Top-normal size 0.6 cm right mesenteric node (series 3/image 73). No pathologically enlarged lymph nodes in the abdomen or pelvis. Reproductive: Grossly normal uterus.  No adnexal mass. Other: No pneumoperitoneum, ascites or focal fluid collection. Musculoskeletal: No aggressive appearing focal osseous lesions. Marked lumbar spondylosis. IMPRESSION: 1. Central perihilar left upper lobe 5.4 cm lung mass, new since 01/27/2017 chest CT, compatible with malignancy, favor primary bronchogenic carcinoma. Postobstructive pneumonia/atelectasis in the lingula. 2. Mediastinal adenopathy in the left prevascular/AP window and subcarinal stations, compatible with metastatic nodes (more likely originating from the left upper lobe lung mass). 3. Trace dependent left pleural effusion. 4. Apple-core lesion in the ascending colon, compatible with known primary colonic neoplasm. Adjacent top-normal size right mesenteric lymph node is equivocal for locoregional nodal metastasis. No additional potential findings of metastatic disease in the abdomen or pelvis. 5. Chronic findings include: Aortic Atherosclerosis (ICD10-I70.0). Borderline mild cardiomegaly. One vessel coronary atherosclerosis. Dilated main pulmonary artery, suggesting pulmonary arterial hypertension. Left adrenal adenoma. Punctate bilateral nephrolithiasis. Small hiatal hernia. Electronically Signed   By: Ilona Sorrel M.D.   On: 09/11/2018 16:42   Ct Abdomen Pelvis W Contrast  Result Date: 09/11/2018 CLINICAL DATA:  68 year old female inpatient admitted with symptomatic anemia and decompensated heart failure, found to have hepatic  flexure colon mass on colonoscopy 09/09/2018. staging evaluation. EXAM: CT CHEST, ABDOMEN, AND PELVIS WITH CONTRAST TECHNIQUE: Multidetector CT imaging of the chest,  abdomen and pelvis was performed following the standard protocol during bolus administration of intravenous contrast. CONTRAST:  159mL OMNIPAQUE IOHEXOL 300 MG/ML  SOLN COMPARISON:  08/30/2018 chest radiograph. 01/27/2017 chest CT angiogram. 04/05/2018 CT abdomen/pelvis. FINDINGS: CT CHEST FINDINGS Cardiovascular: Borderline mild cardiomegaly. No significant pericardial effusion/thickening. Left anterior descending coronary atherosclerosis. Atherosclerotic nonaneurysmal thoracic aorta. Dilated main pulmonary artery (3.5 cm diameter). No central pulmonary emboli. Mediastinum/Nodes: No discrete thyroid nodules. Unremarkable esophagus. No axillary adenopathy. Multiple enlarged left prevascular/AP window lymph nodes, largest 1.6 cm (series 3/image 18), new since 01/27/2017 chest CT. New enlarged 2.1 cm subcarinal node (series 3/image 23). No right hilar adenopathy. Lungs/Pleura: No pneumothorax. Trace dependent left pleural effusion. No right pleural effusion. Central perihilar left upper lobe 5.4 x 4.2 cm lung mass (series 3/image 27), new since 01/27/2017 chest CT, with associated high-grade stenosis of the left upper lobe bronchus. Postobstructive consolidation in lingula with some associated volume loss. No significant pulmonary nodules. Mild passive atelectasis in dependent lower lobes. Musculoskeletal: No aggressive appearing focal osseous lesions. Mild thoracic spondylosis. CT ABDOMEN PELVIS FINDINGS Hepatobiliary: Normal liver size. No liver mass. Normal gallbladder with no radiopaque cholelithiasis. No biliary ductal dilatation. Pancreas: Normal, with no mass or duct dilation. Spleen: Normal size. No mass. Adrenals/Urinary Tract: Left adrenal 2.4 cm nodule with density 14 HU, stable in size since 04/30/2015 CT, compatible with a benign adenoma.  Stable appearance of the right adrenal gland without discrete right adrenal nodule. Punctate nonobstructing interpolar right and upper left renal stones. No hydronephrosis. Subcentimeter hypodense renal cortical lesion in the lower left kidney is too small to characterize and is unchanged, considered benign. No additional renal lesions. Normal bladder. Stomach/Bowel: Small hiatal hernia. Otherwise normal nondistended stomach. Normal caliber small bowel with no small bowel wall thickening. Appendix not discretely visualized. No pericecal inflammatory changes. Oral contrast transits to the right colon. There is an apple-core lesion in the ascending colon measuring 4.0 x 3.9 cm (series 5/image 60) with irregular annular wall thickening. No additional sites of large bowel wall thickening. No significant colonic diverticulosis. No acute pericolonic fat stranding. Moderate diffuse colonic stool volume. Vascular/Lymphatic: Atherosclerotic nonaneurysmal abdominal aorta. Patent portal, splenic, hepatic and renal veins. Top-normal size 0.6 cm right mesenteric node (series 3/image 73). No pathologically enlarged lymph nodes in the abdomen or pelvis. Reproductive: Grossly normal uterus.  No adnexal mass. Other: No pneumoperitoneum, ascites or focal fluid collection. Musculoskeletal: No aggressive appearing focal osseous lesions. Marked lumbar spondylosis. IMPRESSION: 1. Central perihilar left upper lobe 5.4 cm lung mass, new since 01/27/2017 chest CT, compatible with malignancy, favor primary bronchogenic carcinoma. Postobstructive pneumonia/atelectasis in the lingula. 2. Mediastinal adenopathy in the left prevascular/AP window and subcarinal stations, compatible with metastatic nodes (more likely originating from the left upper lobe lung mass). 3. Trace dependent left pleural effusion. 4. Apple-core lesion in the ascending colon, compatible with known primary colonic neoplasm. Adjacent top-normal size right mesenteric lymph  node is equivocal for locoregional nodal metastasis. No additional potential findings of metastatic disease in the abdomen or pelvis. 5. Chronic findings include: Aortic Atherosclerosis (ICD10-I70.0). Borderline mild cardiomegaly. One vessel coronary atherosclerosis. Dilated main pulmonary artery, suggesting pulmonary arterial hypertension. Left adrenal adenoma. Punctate bilateral nephrolithiasis. Small hiatal hernia. Electronically Signed   By: Ilona Sorrel M.D.   On: 09/11/2018 16:42   US Venous Img Lower Bilateral  Result Date: 09/08/2018 CLINICAL DATA:  Lower extremity pain EXAM: BILATERAL LOWER EXTREMITY VENOUS DUPLEX ULTRASOUND TECHNIQUE: Doppler venous assessment of the bilateral lower extremity deep venous system  was performed, including characterization of spectral flow, compressibility, and phasicity. COMPARISON:  None. FINDINGS: There is complete compressibility of the bilateral common femoral, femoral, and popliteal veins. Doppler analysis demonstrates respiratory phasicity and augmentation of flow with calf compression. No obvious superficial vein or calf vein thrombosis. IMPRESSION: No evidence of lower extremity DVT. Electronically Signed   By: Marybelle Killings M.D.   On: 09/08/2018 13:27   Dg Chest Portable 1 View  Result Date: 08/30/2018 CLINICAL DATA:  Short of breath EXAM: PORTABLE CHEST 1 VIEW COMPARISON:  07/05/2018 FINDINGS: Mild cardiac enlargement. Negative for heart failure or edema. Mild left lower lobe airspace disease and small left effusion. Right lung base clear. IMPRESSION: Mild left lower lobe atelectasis/infiltrate and small left effusion. Mild progression since the prior study. Electronically Signed   By: Franchot Gallo M.D.   On: 08/30/2018 21:33    ASSESSMENT AND PLAN: 1.  Colorectal adenocarcinoma 2.  Non-small cell lung cancer, adenocarcinoma 3.  Iron deficiency anemia 4.  Coronary artery disease 5.  Congestive heart failure 6.  Diabetes mellitus 7.   Hypertension 8.  Hypothyroidism  -Discussed updated pathology information.  Based on immunohistochemical staining that returned today, pathology now indicates that this is a non-small cell lung cancer, adenocarcinoma.  Final pathology report is pending.  This was a verbal report between Dr. Lyndon Code and Dr. Burr Medico.  Discussed with the patient that we can now treat her as an outpatient as opposed to initiating her first cycle of chemotherapy as an inpatient.  Will defer treatment decisions to Dr. Delton Coombes at [facility].  The patient is scheduled for a new patient appointment at the [facility] on Tuesday, Sep 21, 2018 at 1:40 PM. -Agree with MRI of the brain with and without contrast to complete her staging work-up.  This will be done later today. -Anemia remained stable.  She has received 2 doses of Feraheme 510 mg during her hospitalization. -The patient is not a candidate for colon cancer surgery due to her very high risk of surgical mortality.  She is aware of the incurable nature of her colon cancer without surgery. -Okay to discharge the patient to home from oncology standpoint when medically stable.  She has outpatient follow-up at the [facility] already scheduled.   Mikey Bussing, DNP, AGPCNP-BC, AOCNP 09/16/18   Addendum I have seen the patient, examined her. I agree with the assessment and and plan and have edited the notes.   I explained the preliminary lung mass biopsy result to pt, it was reviewed by pathologist Dr. Lyndon Code this morning and he believe this is NSCLC, adenocarcinoma. He will discuss with Dr. Saralyn Pilar tomorrow and finalize the diagnosis. I recommend outpt treatment, and we have scheduled her to see Dr. Delton Coombes at [facility] early next week. Pt voiced good understanding and agrees with the plan.  Truitt Merle  09/16/2018

## 2018-09-16 NOTE — Progress Notes (Signed)
Physical Therapy Treatment Patient Details Name: Stephanie Combs MRN: [id number] DOB: 01-Apr-1951 Today's Date: 09/16/2018    History of Present Illness 68 y.o. female, w CAD s/p DES prox LAD, CHF (EF 60-65%), Dm2, CKD stage3, Hypertension, Hypothyroidism, Chronic lymphedema, with c/o dyspnea for the past 5 days, along with increase in lower ext edema and R LQ pain.  Hx of diverticulosis. Colonoscopy reveals R colon mass. Transferred to [facility] for surgery 5/4.    PT Comments    Pt reports being up to chair for 4 hours and awaiting on transport to MRI so refuses OOB mobility but is agreeable to bed level exercise while waiting. Pt able to perform minimal ROM exercises of UE and LE, before c/o L knee pain, R shoulder pain and back pain. Applied heat packs to areas of complaint. Pt to d/c home tomorrow. PT will follow back to assess mobility before d/c.   Follow Up Recommendations  Home health PT;Supervision for mobility/OOB;Supervision - Intermittent     Equipment Recommendations  None recommended by PT    Recommendations for Other Services       Precautions / Restrictions Precautions Precautions: Fall Restrictions Weight Bearing Restrictions: No          Cognition Arousal/Alertness: Awake/alert Behavior During Therapy: WFL for tasks assessed/performed Overall Cognitive Status: Within Functional Limits for tasks assessed                                        Exercises General Exercises - Upper Extremity Elbow Flexion: AROM;Both;10 reps Elbow Extension: AROM;Both;10 reps Digit Composite Flexion: AROM;Both;10 reps;Supine Composite Extension: AROM;Both;10 reps General Exercises - Lower Extremity Ankle Circles/Pumps: AROM;Both;20 reps;Supine Quad Sets: AROM;Both;10 reps;Supine Heel Slides: AROM;Both;5 reps Straight Leg Raises: AROM;Both;5 reps        Pertinent Vitals/Pain Pain Assessment: Faces Faces Pain Scale: Hurts a little bit Pain Location: Rt shoulder  back and BLE Pain Descriptors / Indicators: Discomfort;Pressure Pain Intervention(s): Limited activity within patient's tolerance;Monitored during session;Heat applied           PT Goals (current goals can now be found in the care plan section) Acute Rehab PT Goals PT Goal Formulation: With patient Time For Goal Achievement: 09/25/18 Potential to Achieve Goals: Fair    Frequency    Min 3X/week      PT Plan Current plan remains appropriate       AM-PAC PT "6 Clicks" Mobility   Outcome Measure  Help needed turning from your back to your side while in a flat bed without using bedrails?: None Help needed moving from lying on your back to sitting on the side of a flat bed without using bedrails?: None Help needed moving to and from a bed to a chair (including a wheelchair)?: A Little Help needed standing up from a chair using your arms (e.g., wheelchair or bedside chair)?: A Little Help needed to walk in hospital room?: A Little Help needed climbing 3-5 steps with a railing? : A Lot 6 Click Score: 19    End of Session Equipment Utilized During Treatment: Oxygen Activity Tolerance: Patient tolerated treatment well;Patient limited by fatigue Patient left: with call bell/phone within reach;in chair(surgical PA in room) Nurse Communication: Mobility status PT Visit Diagnosis: Unsteadiness on feet (R26.81);Other abnormalities of gait and mobility (R26.89);Muscle weakness (generalized) (M62.81)     Time: 7672-0947 PT Time Calculation (min) (ACUTE ONLY): 16 min  Charges:  $Therapeutic  Exercise: 8-22 mins                     Rashae Rother B. Migdalia Dk PT, DPT Acute Rehabilitation Services Pager [phone] Office [phone]    [place] 09/16/2018, 4:21 PM

## 2018-09-16 NOTE — Progress Notes (Addendum)
2 Days Post-Op  Subjective: CC: Right colon mass Patient reports she had a good conversation with oncology yesterday. She is planning on starting chemo for small cell lung cancer at [facility] as well as further workup including an MRI of the brain. She is tolerating a diet. No N/V. Passing normal BMs without diarrhea, blood or melena.   Objective: Vital signs in last 24 hours: Temp:  [97.9 F (36.6 C)-98.2 F (36.8 C)] 98.2 F (36.8 C) (05/07 0618) Pulse Rate:  [72-85] 72 (05/07 0618) Resp:  [18] 18 (05/07 0618) BP: (118-119)/(42-50) 119/42 (05/07 0618) SpO2:  [95 %-99 %] 97 % (05/07 0844) Weight:  [139.3 kg] 139.3 kg (05/07 0618) Last BM Date: 09/15/18  Intake/Output from previous day: 05/06 0701 - 05/07 0700 In: 945 [P.O.:942; I.V.:3] Out: 851 [Urine:850; Stool:1] Intake/Output this shift: No intake/output data recorded.  PE: Gen: Awake and alert, anxious. OOB in chair Heart: Regular Lungs: CTA b/l On 2L o2. Abd: Obese, soft, ND,NT. +BS. Prior umbilical surgical scar.  Msk: B/l LE edema  Lab Results:  Recent Labs    09/15/18 0514  WBC 7.0  HGB 7.6*  HCT 25.5*  PLT 267   BMET Recent Labs    09/14/18 0354 09/15/18 0514  NA 136 137  K 3.9 4.4  CL 96* 102  CO2 29 27  GLUCOSE 188* 243*  BUN 42* 37*  CREATININE 1.43* 1.35*  CALCIUM 8.9 8.8*   PT/INR No results for input(s): LABPROT, INR in the last 72 hours. CMP     Component Value Date/Time   NA 137 09/15/2018 0514   K 4.4 09/15/2018 0514   CL 102 09/15/2018 0514   CO2 27 09/15/2018 0514   GLUCOSE 243 (H) 09/15/2018 0514   BUN 37 (H) 09/15/2018 0514   BUN 28 (A) 11/11/2016   CREATININE 1.35 (H) 09/15/2018 0514   CALCIUM 8.8 (L) 09/15/2018 0514   PROT 5.8 (L) 09/02/2018 0500   ALBUMIN 3.3 (L) 09/09/2018 0425   AST 12 (L) 09/02/2018 0500   ALT 10 09/02/2018 0500   ALKPHOS 97 09/02/2018 0500   BILITOT 0.6 09/02/2018 0500   GFRNONAA 40 (L) 09/15/2018 0514   GFRAA 47 (L) 09/15/2018 0514    Lipase     Component Value Date/Time   LIPASE 21 04/05/2018 1629       Studies/Results: No results found.  Anti-infectives: Anti-infectives (From admission, onward)   Start     Dose/Rate Route Frequency Ordered Stop   09/11/18 1300  cephALEXin (KEFLEX) capsule 500 mg  Status:  Discontinued     500 mg Oral Every 6 hours 09/11/18 1253 09/12/18 1308   09/07/18 1615  ceFAZolin (ANCEF) IVPB 1 g/50 mL premix  Status:  Discontinued     1 g 100 mL/hr over 30 Minutes Intravenous Every 8 hours 09/07/18 1605 09/11/18 1253   09/05/18 1715  cefTRIAXone (ROCEPHIN) 2 g in sodium chloride 0.9 % 100 mL IVPB  Status:  Discontinued     2 g 200 mL/hr over 30 Minutes Intravenous Every 24 hours 09/05/18 1705 09/06/18 1757   09/05/18 1645  cefTRIAXone (ROCEPHIN) 1 g in sodium chloride 0.9 % 100 mL IVPB  Status:  Discontinued     1 g 200 mL/hr over 30 Minutes Intravenous Every 24 hours 09/05/18 1634 09/05/18 1705       Assessment/Plan COPD on 2L home O2 DM CHF admitted for fluid overload CAD s/p stent 1/19 HTN Lymphedema  Anemia- Hgb 7.6 & stable on5/6. VSS.  Receiving Fermoxytol  DNR status   Right Colon Mass - Colonoscopy at [facility] on 4/30 with colon mass near the hepatic flexure, which was biopsied and tattooed.  - Pathology- Adenocarcinoma. Tubular Adenoma w/ one bx site showing high grade dysplasia (splenic flexure) - CEA 2.1 - Poor pulmonary functionand cardiac risk factors.ACS surgery risk calculator predicts mortality of 40%w/partial colectomy. High likelihood she would not come off vent w/ underlying pulmonary function. Discussed this with her.  -Noevidence of complete obstructionor indication for emergencysurgery.Not operative candidate at this point.  - Appreciate Palliative care consult on 5/4  -CT chest showsleft upper lobe5.4cm mass concerning for malignancy. - Underwent Bronchoscopy 5/5. 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  - Med-Onc awaiting final  path/immunohistochemical staining. Plan is to start chemo (carboplatin and etoposide) in the next few days for her lung cancer cancer at [facility]. F/u with Dr. Delton Coombes at [facility] for subsequent treatment. They are also considering MRI brain.  - We will follow peripherally. Follow up with [facility] as outpt when Oncology recommends   FEN -HH diet  VTE - SCD,okay for chemical prophylaxis from a surgical standpoint ID - none currently POC: Daughter Marlyne Beards     LOS: 16 days    Jillyn Ledger , [facility] 09/16/2018, 9:10 AM Pager: [phone]

## 2018-09-16 NOTE — TOC Progression Note (Signed)
Transition of Care [facility]) - Progression Note    Patient Details  Name: Stephanie Combs MRN: [id number] Date of Birth: Sep 22, 1950  Transition of Care [facility]) CM/SW Contact  Royston Bake, RN Phone Number: 09/16/2018, 1:02 PM  Clinical Narrative:    Patient is active with [facility] for [facility] services as prior to admission; has home oxygen through [facility].    Expected Discharge Plan: [place] Barriers to Discharge: No Barriers Identified  Expected Discharge Plan and Services Expected Discharge Plan: [place] arrangements for the past 2 months: [place] Agency: [place]         Social Determinants of Health (SDOH) Interventions    Readmission Risk Interventions Readmission Risk Prevention Plan 09/02/2018 08/31/2018  Transportation Screening - Complete  PCP or Specialist appointment within 3-5 days of discharge Complete -  [place] or [place] - Complete  SW Recovery Care/Counseling Consult - Complete  [place] - Not Applicable  Some recent data might be hidden

## 2018-09-16 NOTE — Progress Notes (Signed)
PROGRESS NOTE    Stephanie Combs  CZY:[id number] DOB: 04/21/1951 DOA: 08/30/2018 PCP: [name], MD    Brief Narrative:  68 year old female who presented with dyspnea and lower extremity edema. Does have significant past medical history for coronary artery disease, diastolic heart failure, type 2 diabetes mellitus, chronic kidney disease stage III, hypertension, hypothyroidism and chronic lymphedema. She reported 5 days of worsening dyspnea along with lower extremity edema and dry cough. Symptoms were refractory to increased dose of oral furosemide. On her initial physical examination she was afebrile, pulse rate 84, respirate 24, blood pressure 130/100, oxygenation 100% on supplemental oxygen. Her lungs are clear to auscultation bilaterally, heart S1-S2 present, rhythmic, no gallops, rubs or murmurs, the abdomen was protuberant, nontender, no lower extremity edema. Sodium 138, potassium 4.2, chloride 102, bicarb 27, glucose 241, BUN 23, creatinine 1.58, AST 13, ALT 11, iron 9, TIBC 431, transferrin saturation 2, ferritin 4, white count 7.7, hemoglobin 7.0, hematocrit 35.0, platelets 377.  Patient was admitted to the hospital with a working diagnosis of symptomatic anemia and decompensated heart failure.  Patient received 2 units packed red blood cells with improvement of her hemoglobin and hematocrit.  Patient was successfully diuresed with IV furosemide, with improvement of her symptoms.  Further work-up with colonoscopy April 30 showed a colonic mass. [facility] for surgical evaluation.colon biopsy shows adeno carcinoma.   Patient was found to have a large left hilar mass, consulted pulmonary and patient underwent bronchoscopy  plus biopsy. biopsy showing non small cell lung carcinoma.    Assessment & Plan:   Principal Problem:   Anemia Active Problems:   DM type 2 (diabetes mellitus, type 2) (HCC)   HTN (hypertension), benign   CKD  (chronic kidney disease), stage III (HCC)   Hypothyroidism   Bilateral lower extremity edema   Uncontrolled type 2 diabetes mellitus with stage 3 chronic kidney disease (HCC)   Obesity, Class III, BMI 40-49.9 (morbid obesity) (HCC)   CAD (coronary artery disease)   Symptomatic anemia   Acute on chronic diastolic CHF (congestive heart failure) (HCC)   Heme positive stool   Non-intractable vomiting   Abdominal pain, epigastric   Pain of lower extremity   Colonic mass   Colon polyps   Cellulitis of leg, left   Malignant neoplasm of hepatic flexure (HCC)   Small cell lung cancer, left upper lobe (HCC)    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 Pt is not a candidate for surgical resection due to high risk.oncology consulted and plan for outpatient management with chemotherapy.    New Hilar mass on the left s/p bronchoscopy and lung biopsy.  Results showing non small cell lung ca as per discussion with Dr Burr Medico. Plan for outpatient follow up with Dr Lamonte Richer.  In any pain cancer Center. Getting MRI of the brain with and without contrast to check for metastatic cancer.   Acute on chronic diastolic heart failure:  She appears to be compensated .  Cardiology consulted and recommendations given . Currently on lasix 40 mg daily.    Left leg cellulitis;  Completed the course of antibiotics.    Morbid Obesity and hyperlipidemia:  Recommend outpatient follow up with PCP.    Type 2 Diabetes Mellitus:  CBG (last 3)  Recent Labs    09/15/18 2108 09/16/18 0617 09/16/18 1119  GLUCAP 225* 176* 176*  Increase the levemir , continue with SSI.    Hypothyroidism:  Resume synthroid.    DVT prophylaxis: Heparin.  Code Status: DNR Family Communication: none at bedside. Disposition Plan: Plan to discharge patient home with home health after the MRI of the brain with and  without contrast.  Consultants:   Cardiology  Oncology Dr Burr Medico  Pulmonology Dr Elsworth Soho  surgery    Procedures: lung biopsy by pulmonology  EGD  Colonoscopy.   Bronchoscopy.    Antimicrobials: none.    Subjective: Patient is upset that chemotherapy was not initiated inpatient.  Patient denies any chest pain shortness of breath nausea or vomiting at this time.  Objective: Vitals:   09/15/18 2028 09/16/18 0618 09/16/18 0844 09/16/18 1122  BP: (!) 118/50 (!) 119/42  (!) 118/46  Pulse: 85 72  65  Resp: 18 18  19   Temp: 97.9 F (36.6 C) 98.2 F (36.8 C)  98.3 F (36.8 C)  TempSrc: Oral Oral  Oral  SpO2: 95% 99% 97% 99%  Weight:  (!) 139.3 kg    Height:        Intake/Output Summary (Last 24 hours) at 09/16/2018 1517 Last data filed at 09/16/2018 1500 Gross per 24 hour  Intake 960 ml  Output 1002 ml  Net -42 ml   Filed Weights   09/14/18 0507 09/15/18 0428 09/16/18 0618  Weight: (!) 137.9 kg (!) 138.4 kg (!) 139.3 kg    Examination:  General exam: Appears calm and comfortable , on 2l it of [place] oxygen. No distress noted.  Respiratory system: Clear to auscultation. Respiratory effort normal.  No wheezing or rhonchi Cardiovascular system: S1 & S2 heard, RRR.  Gastrointestinal system: Abdomen is nondistended, soft and nontender. . Normal bowel sounds heard. Central nervous system: Alert and oriented.  Nonfocal Extremities: trace pedal edema Skin: No rashes, lesions or ulcers Psychiatry:. Mood & affect appropriate.     Data Reviewed: I have personally reviewed following labs and imaging studies  CBC: Recent Labs  Lab 09/12/18 0458 09/15/18 0514  WBC 8.0 7.0  HGB 7.7* 7.6*  HCT 26.9* 25.5*  MCV 86.8 86.1  PLT 267 696   Basic Metabolic Panel: Recent Labs  Lab 09/11/18 1352 09/12/18 0458 09/13/18 0554 09/14/18 0354 09/15/18 0514  NA 133* 135 135 136 137  K 4.4 4.3 4.4 3.9 4.4  CL 96* 98 99 96* 102  CO2 27 30 28 29 27   GLUCOSE 219* 130* 197* 188*  243*  BUN 31* 29* 35* 42* 37*  CREATININE 1.38* 1.29* 1.44* 1.43* 1.35*  CALCIUM 8.5* 8.6* 8.4* 8.9 8.8*   GFR: Estimated Creatinine Clearance: 57.5 mL/min (A) (by C-G formula based on SCr of 1.35 mg/dL (H)). Liver Function Tests: No results for input(s): AST, ALT, ALKPHOS, BILITOT, PROT, ALBUMIN in the last 168 hours. No results for input(s): LIPASE, AMYLASE in the last 168 hours. No results for input(s): AMMONIA in the last 168 hours. Coagulation Profile: No results for input(s): INR, PROTIME in the last 168 hours. Cardiac Enzymes: No results for input(s): CKTOTAL, CKMB, CKMBINDEX, TROPONINI in the last 168 hours. BNP (last 3 results) No results for input(s): PROBNP in the last 8760 hours. HbA1C: No results for input(s): HGBA1C in the last 72 hours. CBG: Recent Labs  Lab 09/15/18 1225 09/15/18 1642 09/15/18 2108 09/16/18 0617 09/16/18 1119  GLUCAP 212* 243* 225* 176* 176*   Lipid Profile: No results for input(s): CHOL, HDL, LDLCALC, TRIG, CHOLHDL, LDLDIRECT in the last 72 hours. Thyroid Function Tests: No  results for input(s): TSH, T4TOTAL, FREET4, T3FREE, THYROIDAB in the last 72 hours. Anemia Panel: No results for input(s): VITAMINB12, FOLATE, FERRITIN, TIBC, IRON, RETICCTPCT in the last 72 hours. Sepsis Labs: No results for input(s): PROCALCITON, LATICACIDVEN in the last 168 hours.  Recent Results (from the past 240 hour(s))  Culture, respiratory     Status: None (Preliminary result)   Collection Time: 09/14/18  1:37 PM  Result Value Ref Range Status   Specimen Description BRONCHIAL ALVEOLAR LAVAGE  Final   Special Requests NONE  Final   Gram Stain   Final    FEW WBC PRESENT,BOTH PMN AND MONONUCLEAR NO ORGANISMS SEEN    Culture   Final    NO GROWTH 2 DAYS Performed at [facility], [address] [place], [place] [address]    Report Status PENDING  Incomplete  Acid Fast Smear (AFB)     Status: None   Collection Time: 09/14/18  1:37 PM  Result Value  Ref Range Status   AFB Specimen Processing Concentration  Final   Acid Fast Smear Negative  Final    Comment: (NOTE) Performed At: [facility] [place], [name] [id number] Rush Farmer MD GB:[id number]    Source (AFB) BRONCHIAL ALVEOLAR LAVAGE  Final    Comment: Performed at [facility], [place] [address], [place], [place] [address]         Radiology Studies: No results found.      Scheduled Meds: . aspirin EC  81 mg Oral Daily  . atorvastatin  80 mg Oral q morning - 10a  . cholecalciferol  5,000 Units Oral Daily  . furosemide  40 mg Oral Daily  . gabapentin  300 mg Oral QID  . Gerhardt's butt cream  1 application Topical TID  . insulin aspart  0-15 Units Subcutaneous TID WC  . insulin aspart  0-5 Units Subcutaneous QHS  . insulin aspart  5 Units Subcutaneous TID WC  . insulin detemir  28 Units Subcutaneous QHS  . levothyroxine  200 mcg Oral QAC breakfast  . nystatin   Topical BID  . omega-3 acid ethyl esters  1 g Oral Daily  . pantoprazole  40 mg Oral BID  . polyethylene glycol  17 g Oral BID  . senna  2 tablet Oral BID WC  . sodium chloride flush  3 mL Intravenous Q12H  . topiramate  150 mg Oral QPM  . umeclidinium-vilanterol  1 puff Inhalation Daily   Continuous Infusions: . sodium chloride Stopped (09/11/18 1716)  . lactated ringers 10 mL/hr at 09/14/18 1236     LOS: 16 days    Time spent: 32 minutes.     Hosie Poisson, MD [facility] Pager [phone]  If 7PM-7AM, please contact night-coverage [email or web address] Password [facility] 09/16/2018, 3:17 PM

## 2018-09-16 NOTE — Progress Notes (Signed)
Pt had an iron infusion which interferes with the MRI images making the non-contrasted images look like there is contrast in the brain.  Spoke with Dr. Jeannine Boga who recommends waiting a couple of days.  Relayed the message to Dr. Karleen Hampshire.

## 2018-09-17 ENCOUNTER — Other Ambulatory Visit: Payer: Self-pay | Admitting: Hematology

## 2018-09-17 ENCOUNTER — Inpatient Hospital Stay (HOSPITAL_COMMUNITY): Payer: Medicare Other

## 2018-09-17 DIAGNOSIS — R600 Localized edema: Secondary | ICD-10-CM

## 2018-09-17 LAB — CULTURE, RESPIRATORY W GRAM STAIN: Culture: NO GROWTH

## 2018-09-17 LAB — CBC
HCT: 25.5 % — ABNORMAL LOW (ref 36.0–46.0)
Hemoglobin: 7.5 g/dL — ABNORMAL LOW (ref 12.0–15.0)
MCH: 25.9 pg — ABNORMAL LOW (ref 26.0–34.0)
MCHC: 29.4 g/dL — ABNORMAL LOW (ref 30.0–36.0)
MCV: 87.9 fL (ref 80.0–100.0)
Platelets: 269 10*3/uL (ref 150–400)
RBC: 2.9 MIL/uL — ABNORMAL LOW (ref 3.87–5.11)
RDW: 21.5 % — ABNORMAL HIGH (ref 11.5–15.5)
WBC: 7.5 10*3/uL (ref 4.0–10.5)
nRBC: 0 % (ref 0.0–0.2)

## 2018-09-17 LAB — GLUCOSE, CAPILLARY
Glucose-Capillary: 175 mg/dL — ABNORMAL HIGH (ref 70–99)
Glucose-Capillary: 162 mg/dL — ABNORMAL HIGH (ref 70–99)
Glucose-Capillary: 177 mg/dL — ABNORMAL HIGH (ref 70–99)
Glucose-Capillary: 181 mg/dL — ABNORMAL HIGH (ref 70–99)

## 2018-09-17 LAB — BASIC METABOLIC PANEL
Anion gap: 10 (ref 5–15)
BUN: 40 mg/dL — ABNORMAL HIGH (ref 8–23)
CO2: 27 mmol/L (ref 22–32)
Calcium: 8.7 mg/dL — ABNORMAL LOW (ref 8.9–10.3)
Chloride: 99 mmol/L (ref 98–111)
Creatinine, Ser: 1.28 mg/dL — ABNORMAL HIGH (ref 0.44–1.00)
GFR calc Af Amer: 50 mL/min — ABNORMAL LOW (ref >60)
GFR calc non Af Amer: 43 mL/min — ABNORMAL LOW (ref >60)
Glucose, Bld: 226 mg/dL — ABNORMAL HIGH (ref 70–99)
Potassium: 4 mmol/L (ref 3.5–5.1)
Sodium: 136 mmol/L (ref 135–145)

## 2018-09-17 MED ORDER — BENZONATATE 100 MG PO CAPS: 200.0000 mg | ORAL_CAPSULE | Freq: Three times a day (TID) | ORAL | Status: DC | PRN

## 2018-09-17 MED ORDER — FUROSEMIDE 40 MG PO TABS
40.0000 mg | ORAL_TABLET | Freq: Every day | ORAL | Status: DC
  Administered 2018-09-18: 40 mg via ORAL
  Filled 2018-09-17: qty 1

## 2018-09-17 MED ORDER — FUROSEMIDE 10 MG/ML IJ SOLN
20.0000 mg | Freq: Once | INTRAMUSCULAR | Status: AC
  Administered 2018-09-17: 20 mg via INTRAVENOUS
  Filled 2018-09-17 (×2): qty 2

## 2018-09-17 MED ORDER — INSULIN DETEMIR 100 UNIT/ML ~~LOC~~ SOLN
32.0000 [IU] | Freq: Every day | SUBCUTANEOUS | Status: DC
  Administered 2018-09-17 – 2018-09-19 (×3): 32 [IU] via SUBCUTANEOUS
  Filled 2018-09-17 (×5): qty 0.32

## 2018-09-17 MED ORDER — IPRATROPIUM-ALBUTEROL 0.5-2.5 (3) MG/3ML IN SOLN
3.0000 mL | Freq: Once | RESPIRATORY_TRACT | Status: AC
  Administered 2018-09-17: 3 mL via RESPIRATORY_TRACT
  Filled 2018-09-17: qty 3

## 2018-09-17 MED ORDER — GUAIFENESIN-DM 100-10 MG/5ML PO SYRP
5.0000 mL | ORAL_SOLUTION | ORAL | Status: DC | PRN
  Administered 2018-09-17 – 2018-09-19 (×5): 5 mL via ORAL
  Filled 2018-09-17 (×4): qty 10
  Filled 2018-09-17: qty 5

## 2018-09-17 NOTE — Progress Notes (Signed)
Report given to receiving RN at [facility], patient transfer to [employer] by [employer] 

## 2018-09-17 NOTE — Progress Notes (Signed)
Patient transferred from [facility]. Alert and oriented x 4. RN agrees with previous nurse assessment. Vital signs stable. Patient put on tele monitor. Belongings at bedside.

## 2018-09-17 NOTE — Progress Notes (Signed)
START ON PATHWAY REGIMEN - Small Cell Lung     A cycle is every 21 days:     Etoposide      Carboplatin   **Always confirm dose/schedule in your pharmacy ordering system**  Patient Characteristics: Newly Diagnosed, Preoperative or Nonsurgical Candidate (Clinical Staging), First Line, Limited Stage, Nonsurgical Candidate Therapeutic Status: Newly Diagnosed, Preoperative or Nonsurgical Candidate (Clinical Staging) AJCC T Category: cT2 AJCC N Category: cN2 AJCC M Category: cM0 AJCC 8 Stage Grouping: IIIA Stage Classification: Limited Surgical Candidacy: Nonsurgical Candidate Intent of Therapy: Curative Intent, Discussed with Patient

## 2018-09-17 NOTE — Progress Notes (Signed)
Attempt to call report was told receiving RN off the floor, RN will call.

## 2018-09-17 NOTE — Progress Notes (Signed)
PROGRESS NOTE    SUMEDHA Combs  EHU:[id number] DOB: Apr 04, 1951 DOA: 08/30/2018 PCP: [name], MD    Brief Narrative:  68 year old female who presented with dyspnea and lower extremity edema. Does have significant past medical history for coronary artery disease, diastolic heart failure, type 2 diabetes mellitus, chronic kidney disease stage III, hypertension, hypothyroidism and chronic lymphedema. She reported 5 days of worsening dyspnea along with lower extremity edema and dry cough. Symptoms were refractory to increased dose of oral furosemide. On her initial physical examination she was afebrile, pulse rate 84, respirate 24, blood pressure 130/100, oxygenation 100% on supplemental oxygen. Her lungs are clear to auscultation bilaterally, heart S1-S2 present, rhythmic, no gallops, rubs or murmurs, the abdomen was protuberant, nontender, no lower extremity edema. Sodium 138, potassium 4.2, chloride 102, bicarb 27, glucose 241, BUN 23, creatinine 1.58, AST 13, ALT 11, iron 9, TIBC 431, transferrin saturation 2, ferritin 4, white count 7.7, hemoglobin 7.0, hematocrit 35.0, platelets 377.  Patient was admitted to the hospital with a working diagnosis of symptomatic anemia and decompensated heart failure.  Patient received 2 units packed red blood cells with improvement of her hemoglobin and hematocrit.  Patient was successfully diuresed with IV furosemide, with improvement of her symptoms.  Further work-up with colonoscopy April 30 showed a colonic mass. [facility] evaluation.colon biopsy shows adeno carcinoma.   Patient was found to have a large left hilar mass, consulted pulmonary and patient underwent bronchoscopy  plus biopsy. biopsy showing small cell lung carcinoma.   Transfer patient to [facility] for starting chemo therapy.  MRI brain ordered but couldn't be done as she received IV iron which would interfere with Contrast.     Assessment & Plan:   Principal Problem:   Anemia Active Problems:   DM type 2 (diabetes mellitus, type 2) (HCC)   HTN (hypertension), benign   CKD (chronic kidney disease), stage III (HCC)   Hypothyroidism   Bilateral lower extremity edema   Uncontrolled type 2 diabetes mellitus with stage 3 chronic kidney disease (HCC)   Obesity, Class III, BMI 40-49.9 (morbid obesity) (HCC)   CAD (coronary artery disease)   Symptomatic anemia   Acute on chronic diastolic CHF (congestive heart failure) (HCC)   Heme positive stool   Non-intractable vomiting   Abdominal pain, epigastric   Pain of lower extremity   Colonic mass   Colon polyps   Cellulitis of leg, left   Malignant neoplasm of hepatic flexure (HCC)   Small cell lung cancer, left upper lobe (HCC)    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 Pt is not a candidate for surgical resection due to high risk.oncology consulted and plan for outpatient management with chemotherapy.    New Hilar mass on the left s/p bronchoscopy and lung biopsy.  Results showing small cell lung ca as per discussion with Dr Burr Medico. Plan for transfer to  [facility] for starting chemotherapy.    Acute on chronic diastolic heart failure:  She appears to be compensated, one dose of IV lasix given. And continue with 40 mg daily orally starting from tomorrow.  Cardiology consulted and recommendations given .   Left leg cellulitis;  Completed the course of antibiotics.    Morbid Obesity and hyperlipidemia:  Recommend outpatient follow up with PCP.    Type 2 Diabetes Mellitus:  CBG (last 3)  Recent Labs  09/17/18 0628 09/17/18 1136 09/17/18 1603  GLUCAP 175* 162* 177*   Increase the levemir , continue with SSI.    Hypothyroidism:  Resume synthroid.    DVT prophylaxis: Heparin.  Code Status: DNR Family Communication: none at bedside.  Disposition Plan: Plan to discharge patient home with home health after chemo  Consultants:   Cardiology  Oncology Dr Burr Medico  Pulmonology Dr Elsworth Soho  surgery    Procedures: lung biopsy by pulmonology  EGD  Colonoscopy.   Bronchoscopy.    Antimicrobials: none.    Subjective: She repots sob, and congestion. .  Objective: Vitals:   09/17/18 1037 09/17/18 1135 09/17/18 1403 09/17/18 1552  BP:  (!) 126/59 (!) 130/48 (!) 127/55  Pulse:  84 84 76  Resp:  20 20 (!) 23  Temp:  98.2 F (36.8 C) 98 F (36.7 C) 98.3 F (36.8 C)  TempSrc:  Oral Oral Oral  SpO2: 98% 97% 98% 100%  Weight:      Height:        Intake/Output Summary (Last 24 hours) at 09/17/2018 1854 Last data filed at 09/17/2018 1700 Gross per 24 hour  Intake 960 ml  Output 2100 ml  Net -1140 ml   Filed Weights   09/15/18 0428 09/16/18 0618 09/17/18 7544  Weight: (!) 138.4 kg (!) 139.3 kg (!) 139.1 kg    Examination:  General exam: Appears calm and comfortable , on 2l it of [place] oxygen. No distress noted.  Respiratory system: Clear to auscultation. Respiratory effort normal.  No wheezing or rhonchi Cardiovascular system: S1 & S2 heard, RRR.  Gastrointestinal system: Abdomen is nondistended, soft and nontender. . Normal bowel sounds heard. Central nervous system: Alert and oriented.  Nonfocal Extremities: trace pedal edema Skin: No rashes, lesions or ulcers Psychiatry:. Mood & affect appropriate.     Data Reviewed: I have personally reviewed following labs and imaging studies  CBC: Recent Labs  Lab 09/12/18 0458 09/15/18 0514 09/17/18 0855  WBC 8.0 7.0 7.5  HGB 7.7* 7.6* 7.5*  HCT 26.9* 25.5* 25.5*  MCV 86.8 86.1 87.9  PLT 267 267 920   Basic Metabolic Panel: Recent Labs  Lab 09/12/18 0458 09/13/18 0554 09/14/18 0354 09/15/18 0514 09/17/18 0855  NA 135 135 136 137 136  K 4.3 4.4 3.9 4.4 4.0  CL 98 99 96* 102 99  CO2 30 28 29 27 27   GLUCOSE 130* 197* 188* 243* 226*  BUN 29* 35* 42*  37* 40*  CREATININE 1.29* 1.44* 1.43* 1.35* 1.28*  CALCIUM 8.6* 8.4* 8.9 8.8* 8.7*   GFR: Estimated Creatinine Clearance: 60.6 mL/min (A) (by C-G formula based on SCr of 1.28 mg/dL (H)). Liver Function Tests: No results for input(s): AST, ALT, ALKPHOS, BILITOT, PROT, ALBUMIN in the last 168 hours. No results for input(s): LIPASE, AMYLASE in the last 168 hours. No results for input(s): AMMONIA in the last 168 hours. Coagulation Profile: No results for input(s): INR, PROTIME in the last 168 hours. Cardiac Enzymes: No results for input(s): CKTOTAL, CKMB, CKMBINDEX, TROPONINI in the last 168 hours. BNP (last 3 results) No results for input(s): PROBNP in the last 8760 hours. HbA1C: No results for input(s): HGBA1C in the last 72 hours. CBG: Recent Labs  Lab 09/16/18 1557 09/16/18 2308 09/17/18 0628 09/17/18 1136 09/17/18 1603  GLUCAP 226* 214* 175* 162* 177*   Lipid Profile: No results for input(s): CHOL, HDL, LDLCALC, TRIG, CHOLHDL, LDLDIRECT in the last 72 hours. Thyroid Function Tests: No results for input(s): TSH, T4TOTAL, FREET4, T3FREE,  THYROIDAB in the last 72 hours. Anemia Panel: No results for input(s): VITAMINB12, FOLATE, FERRITIN, TIBC, IRON, RETICCTPCT in the last 72 hours. Sepsis Labs: No results for input(s): PROCALCITON, LATICACIDVEN in the last 168 hours.  Recent Results (from the past 240 hour(s))  Fungus Culture With Stain     Status: None (Preliminary result)   Collection Time: 09/14/18  1:37 PM  Result Value Ref Range Status   Fungus Stain Final report  Final    Comment: (NOTE) Performed At: [facility] [place], [name] [id number] Rush Farmer MD [id number]    Fungus (Mycology) Culture PENDING  Incomplete   Fungal Source BRONCHIAL ALVEOLAR LAVAGE  Final    Comment: Performed at [facility], [place] [address] [place], [place] [address]  Culture, respiratory     Status: None   Collection Time: 09/14/18  1:37 PM  Result  Value Ref Range Status   Specimen Description BRONCHIAL ALVEOLAR LAVAGE  Final   Special Requests NONE  Final   Gram Stain   Final    FEW WBC PRESENT,BOTH PMN AND MONONUCLEAR NO ORGANISMS SEEN    Culture   Final    NO GROWTH 2 DAYS Performed at [facility], [place] [address], [place], [place] [address]    Report Status 09/17/2018 FINAL  Final  Acid Fast Smear (AFB)     Status: None   Collection Time: 09/14/18  1:37 PM  Result Value Ref Range Status   AFB Specimen Processing Concentration  Final   Acid Fast Smear Negative  Final    Comment: (NOTE) Performed At: [facility] [place], [name] [id number] Rush Farmer MD [id number]    Source (AFB) BRONCHIAL ALVEOLAR LAVAGE  Final    Comment: Performed at [facility], [place] [address] [place],  [address]  Fungus Culture Result     Status: None   Collection Time: 09/14/18  1:37 PM  Result Value Ref Range Status   Result 1 Comment  Final    Comment: (NOTE) KOH/Calcofluor preparation:  no fungus observed. Performed At: [facility] [place], [name] [id number] Rush Farmer MD [id number]          Radiology Studies: Dg Chest 2 View  Result Date: 09/17/2018 CLINICAL DATA:  Recent onset of cough. EXAM: CHEST - 2 VIEW COMPARISON:  Chest CT from 6 days ago FINDINGS: Left hilar mass with left upper lobe collapse, new. Normal heart size when accounting for rotation and low volumes. IMPRESSION: Collapse of the left upper lobe in this patient with known hilar mass. Electronically Signed   By: Monte Fantasia M.D.   On: 09/17/2018 11:18        Scheduled Meds: . aspirin EC  81 mg Oral Daily  . atorvastatin  80 mg Oral q morning - 10a  . cholecalciferol  5,000 Units Oral Daily  . [START ON 09/18/2018] furosemide  40 mg Oral Daily  . gabapentin  300 mg Oral QID  . Gerhardt's butt cream  1 application Topical TID  . insulin aspart  0-15 Units Subcutaneous TID WC  .  insulin aspart  0-5 Units Subcutaneous QHS  . insulin aspart  5 Units Subcutaneous TID WC  . insulin detemir  32 Units Subcutaneous QHS  . levothyroxine  200 mcg Oral QAC breakfast  . nystatin   Topical BID  . omega-3 acid ethyl esters  1 g Oral Daily  . pantoprazole  40 mg Oral BID  .  polyethylene glycol  17 g Oral BID  . senna  2 tablet Oral BID WC  . sodium chloride flush  3 mL Intravenous Q12H  . topiramate  150 mg Oral QPM  . umeclidinium-vilanterol  1 puff Inhalation Daily   Continuous Infusions: . sodium chloride Stopped (09/11/18 1716)  . lactated ringers 10 mL/hr at 09/14/18 1236     LOS: 17 days    Time spent: 32 minutes.     Hosie Poisson, MD [facility] Pager [phone]  If 7PM-7AM, please contact night-coverage [email or web address] Password [facility] 09/17/2018, 6:54 PM

## 2018-09-17 NOTE — Progress Notes (Signed)
Inpatient Diabetes Program Recommendations  AACE/ADA: New Consensus Statement on Inpatient Glycemic Control (2015)  Target Ranges:  Prepandial:   less than 140 mg/dL      Peak postprandial:   less than 180 mg/dL (1-2 hours)      Critically ill patients:  140 - 180 mg/dL    Results for Stephanie Combs, Stephanie Combs (MRN [id number]) as of 09/17/2018 08:42  Ref. Range 09/16/2018 06:17 09/16/2018 11:19 09/16/2018 15:57 09/16/2018 23:08  Glucose-Capillary Latest Ref Range: 70 - 99 mg/dL 176 (H)  8 units NOVOLOG  176 (H)  8 units NOVOLOG  226 (H)  10 units NOVOLOG  214 (H)  2 units NOVOLOG +  28 units LANTUS   Results for Stephanie Combs, Stephanie Combs (MRN [id number]) as of 09/17/2018 08:42  Ref. Range 09/17/2018 06:28  Glucose-Capillary Latest Ref Range: 70 - 99 mg/dL 175 (H)  8 units NOVOLOG       Home DM Meds: Levemir 40 units QHS       Novolog 1-20 units TID with meals  Current Orders: Levemir 28 units QHS     Novolog Moderate Correction Scale/ SSI (0-15 units) TID AC + HS      Novolog 5 units TID with meals      MD- Please consider the following in-hospital insulin adjustments:  1. Increase Levemir slightly to 30 units QHS (AM CBG still slightly elevated this AM)  2. Increase Novolog Meal Coverage to: Novolog 8 units TID with meals  (Please add the following Hold Parameters: Hold if pt eats <50% of meal, Hold if pt NPO)    --Will follow patient during hospitalization--  Wyn Quaker RN, MSN, CDE Diabetes Coordinator Inpatient Glycemic Control Team Team Pager: [phone] (8a-5p)

## 2018-09-17 NOTE — TOC Transition Note (Signed)
Transition of Care [facility]) - CM/SW Discharge Note   Patient Details  Name: Stephanie Combs MRN: [id number] Date of Birth: [birth year]  Transition of Care [facility]) CM/SW Contact:  Sherrilyn Rist Phone Number: [phone] 09/17/2018, 1:49 PM   Clinical Narrative:    Patient to be transferred to [facility]; [facility] arranged with [facility] as requested.    Barriers to Discharge: No Barriers Identified   Patient Goals and CMS Choice        Discharge Placement                       Discharge Plan and Services                            HH Agency: [place]        Social Determinants of Health (SDOH) Interventions     Readmission Risk Interventions Readmission Risk Prevention Plan 09/02/2018 08/31/2018  Transportation Screening - Complete  PCP or Specialist appointment within 3-5 days of discharge Complete -  [place] or [place] - Complete  SW Recovery Care/Counseling Consult - Complete  [place] - Not Applicable  Some recent data might be hidden

## 2018-09-17 NOTE — Progress Notes (Addendum)
HEMATOLOGY-ONCOLOGY PROGRESS NOTE  SUBJECTIVE: The patient is having increased shortness of breath and cough this morning.  A chest x-ray was obtained showed collapse of the left upper lobe in a patient with a known hilar mass.  Pathology has confirmed that her lung mass is consistent with small cell lung cancer.  Updated findings were discussed with the patient and we again have discussed proceeding with her first cycle of chemotherapy as an inpatient.  The patient is frustrated with the change in plans.  She will be transferred to [facility] this afternoon.  She has no other complaints other than the shortness of breath and cough.  REVIEW OF SYSTEMS:   Constitutional: Reports fatigue.  Denies fevers and chills. Eyes: Denies blurriness of vision Ears, nose, mouth, throat, and face: Denies mucositis or sore throat Respiratory: Reports cough and congestion. Cardiovascular: Denies palpitation, chest discomfort Gastrointestinal:  Denies nausea, heartburn or change in bowel habits Skin: Denies abnormal skin rashes Lymphatics: Denies new lymphadenopathy or easy bruising Neurological:Denies numbness, tingling or new weaknesses Behavioral/Psych: Mood is stable, no new changes  Extremities: Has ongoing lower extremity edema. All other systems were reviewed with the patient and are negative.  I have reviewed the past medical history, past surgical history, social history and family history with the patient and they are unchanged from previous note.   PHYSICAL EXAMINATION:  Vitals:   09/17/18 1037 09/17/18 1135  BP:  (!) 126/59  Pulse:  84  Resp:  20  Temp:  98.2 F (36.8 C)  SpO2: 98% 97%   Filed Weights   09/15/18 0428 09/16/18 0618 09/17/18 0608  Weight: (!) 305 lb 3.2 oz (138.4 kg) (!) 307 lb 3.2 oz (139.3 kg) (!) 306 lb 9.6 oz (139.1 kg)    Intake/Output from previous day: 05/07 0701 - 05/08 0700 In: 2025 [P.O.:1320; IV Piggyback:117] Out: 4270 [Urine:1750;  Stool:4]  GENERAL:alert, no distress and comfortable SKIN: skin color, texture, turgor are normal, no rashes or significant lesions EYES: normal, Conjunctiva are pink and non-injected, sclera clear OROPHARYNX:no exudate, no erythema and lips, buccal mucosa, and tongue normal  NECK: supple, thyroid normal size, non-tender, without nodularity LYMPH:  no palpable lymphadenopathy in the cervical, axillary or inguinal LUNGS: Normal breathing effort.  Expiratory wheezes noted. HEART: regular rate & rhythm and no murmurs.  2+ lower extremity edema. ABDOMEN:abdomen soft, non-tender and normal bowel sounds Musculoskeletal:no cyanosis of digits and no clubbing  NEURO: alert & oriented x 3 with fluent speech, no focal motor/sensory deficits  LABORATORY DATA:  I have reviewed the data as listed CMP Latest Ref Rng & Units 09/17/2018 09/15/2018 09/14/2018  Glucose 70 - 99 mg/dL 226(H) 243(H) 188(H)  BUN 8 - 23 mg/dL 40(H) 37(H) 42(H)  Creatinine 0.44 - 1.00 mg/dL 1.28(H) 1.35(H) 1.43(H)  Sodium 135 - 145 mmol/L 136 137 136  Potassium 3.5 - 5.1 mmol/L 4.0 4.4 3.9  Chloride 98 - 111 mmol/L 99 102 96(L)  CO2 22 - 32 mmol/L '27 27 29  ' Calcium 8.9 - 10.3 mg/dL 8.7(L) 8.8(L) 8.9  Total Protein 6.5 - 8.1 g/dL - - -  Total Bilirubin 0.3 - 1.2 mg/dL - - -  Alkaline Phos 38 - 126 U/L - - -  AST 15 - 41 U/L - - -  ALT 0 - 44 U/L - - -    Lab Results  Component Value Date   WBC 7.5 09/17/2018   HGB 7.5 (L) 09/17/2018   HCT 25.5 (L) 09/17/2018   MCV 87.9  09/17/2018   PLT 269 09/17/2018   NEUTROABS 4.0 09/04/2018    Dg Chest 2 View  Result Date: 09/17/2018 CLINICAL DATA:  Recent onset of cough. EXAM: CHEST - 2 VIEW COMPARISON:  Chest CT from 6 days ago FINDINGS: Left hilar mass with left upper lobe collapse, new. Normal heart size when accounting for rotation and low volumes. IMPRESSION: Collapse of the left upper lobe in this patient with known hilar mass. Electronically Signed   By: Monte Fantasia M.D.    On: 09/17/2018 11:18   Ct Chest W Contrast  Result Date: 09/11/2018 CLINICAL DATA:  68 year old female inpatient admitted with symptomatic anemia and decompensated heart failure, found to have hepatic flexure colon mass on colonoscopy 09/09/2018. staging evaluation. EXAM: CT CHEST, ABDOMEN, AND PELVIS WITH CONTRAST TECHNIQUE: Multidetector CT imaging of the chest, abdomen and pelvis was performed following the standard protocol during bolus administration of intravenous contrast. CONTRAST:  166m OMNIPAQUE IOHEXOL 300 MG/ML  SOLN COMPARISON:  08/30/2018 chest radiograph. 01/27/2017 chest CT angiogram. 04/05/2018 CT abdomen/pelvis. FINDINGS: CT CHEST FINDINGS Cardiovascular: Borderline mild cardiomegaly. No significant pericardial effusion/thickening. Left anterior descending coronary atherosclerosis. Atherosclerotic nonaneurysmal thoracic aorta. Dilated main pulmonary artery (3.5 cm diameter). No central pulmonary emboli. Mediastinum/Nodes: No discrete thyroid nodules. Unremarkable esophagus. No axillary adenopathy. Multiple enlarged left prevascular/AP window lymph nodes, largest 1.6 cm (series 3/image 18), new since 01/27/2017 chest CT. New enlarged 2.1 cm subcarinal node (series 3/image 23). No right hilar adenopathy. Lungs/Pleura: No pneumothorax. Trace dependent left pleural effusion. No right pleural effusion. Central perihilar left upper lobe 5.4 x 4.2 cm lung mass (series 3/image 27), new since 01/27/2017 chest CT, with associated high-grade stenosis of the left upper lobe bronchus. Postobstructive consolidation in lingula with some associated volume loss. No significant pulmonary nodules. Mild passive atelectasis in dependent lower lobes. Musculoskeletal: No aggressive appearing focal osseous lesions. Mild thoracic spondylosis. CT ABDOMEN PELVIS FINDINGS Hepatobiliary: Normal liver size. No liver mass. Normal gallbladder with no radiopaque cholelithiasis. No biliary ductal dilatation. Pancreas: Normal,  with no mass or duct dilation. Spleen: Normal size. No mass. Adrenals/Urinary Tract: Left adrenal 2.4 cm nodule with density 14 HU, stable in size since 04/30/2015 CT, compatible with a benign adenoma. Stable appearance of the right adrenal gland without discrete right adrenal nodule. Punctate nonobstructing interpolar right and upper left renal stones. No hydronephrosis. Subcentimeter hypodense renal cortical lesion in the lower left kidney is too small to characterize and is unchanged, considered benign. No additional renal lesions. Normal bladder. Stomach/Bowel: Small hiatal hernia. Otherwise normal nondistended stomach. Normal caliber small bowel with no small bowel wall thickening. Appendix not discretely visualized. No pericecal inflammatory changes. Oral contrast transits to the right colon. There is an apple-core lesion in the ascending colon measuring 4.0 x 3.9 cm (series 5/image 60) with irregular annular wall thickening. No additional sites of large bowel wall thickening. No significant colonic diverticulosis. No acute pericolonic fat stranding. Moderate diffuse colonic stool volume. Vascular/Lymphatic: Atherosclerotic nonaneurysmal abdominal aorta. Patent portal, splenic, hepatic and renal veins. Top-normal size 0.6 cm right mesenteric node (series 3/image 73). No pathologically enlarged lymph nodes in the abdomen or pelvis. Reproductive: Grossly normal uterus.  No adnexal mass. Other: No pneumoperitoneum, ascites or focal fluid collection. Musculoskeletal: No aggressive appearing focal osseous lesions. Marked lumbar spondylosis. IMPRESSION: 1. Central perihilar left upper lobe 5.4 cm lung mass, new since 01/27/2017 chest CT, compatible with malignancy, favor primary bronchogenic carcinoma. Postobstructive pneumonia/atelectasis in the lingula. 2. Mediastinal adenopathy in the left prevascular/AP window and  subcarinal stations, compatible with metastatic nodes (more likely originating from the left upper  lobe lung mass). 3. Trace dependent left pleural effusion. 4. Apple-core lesion in the ascending colon, compatible with known primary colonic neoplasm. Adjacent top-normal size right mesenteric lymph node is equivocal for locoregional nodal metastasis. No additional potential findings of metastatic disease in the abdomen or pelvis. 5. Chronic findings include: Aortic Atherosclerosis (ICD10-I70.0). Borderline mild cardiomegaly. One vessel coronary atherosclerosis. Dilated main pulmonary artery, suggesting pulmonary arterial hypertension. Left adrenal adenoma. Punctate bilateral nephrolithiasis. Small hiatal hernia. Electronically Signed   By: Ilona Sorrel M.D.   On: 09/11/2018 16:42   Ct Abdomen Pelvis W Contrast  Result Date: 09/11/2018 CLINICAL DATA:  68 year old female inpatient admitted with symptomatic anemia and decompensated heart failure, found to have hepatic flexure colon mass on colonoscopy 09/09/2018. staging evaluation. EXAM: CT CHEST, ABDOMEN, AND PELVIS WITH CONTRAST TECHNIQUE: Multidetector CT imaging of the chest, abdomen and pelvis was performed following the standard protocol during bolus administration of intravenous contrast. CONTRAST:  149m OMNIPAQUE IOHEXOL 300 MG/ML  SOLN COMPARISON:  08/30/2018 chest radiograph. 01/27/2017 chest CT angiogram. 04/05/2018 CT abdomen/pelvis. FINDINGS: CT CHEST FINDINGS Cardiovascular: Borderline mild cardiomegaly. No significant pericardial effusion/thickening. Left anterior descending coronary atherosclerosis. Atherosclerotic nonaneurysmal thoracic aorta. Dilated main pulmonary artery (3.5 cm diameter). No central pulmonary emboli. Mediastinum/Nodes: No discrete thyroid nodules. Unremarkable esophagus. No axillary adenopathy. Multiple enlarged left prevascular/AP window lymph nodes, largest 1.6 cm (series 3/image 18), new since 01/27/2017 chest CT. New enlarged 2.1 cm subcarinal node (series 3/image 23). No right hilar adenopathy. Lungs/Pleura: No  pneumothorax. Trace dependent left pleural effusion. No right pleural effusion. Central perihilar left upper lobe 5.4 x 4.2 cm lung mass (series 3/image 27), new since 01/27/2017 chest CT, with associated high-grade stenosis of the left upper lobe bronchus. Postobstructive consolidation in lingula with some associated volume loss. No significant pulmonary nodules. Mild passive atelectasis in dependent lower lobes. Musculoskeletal: No aggressive appearing focal osseous lesions. Mild thoracic spondylosis. CT ABDOMEN PELVIS FINDINGS Hepatobiliary: Normal liver size. No liver mass. Normal gallbladder with no radiopaque cholelithiasis. No biliary ductal dilatation. Pancreas: Normal, with no mass or duct dilation. Spleen: Normal size. No mass. Adrenals/Urinary Tract: Left adrenal 2.4 cm nodule with density 14 HU, stable in size since 04/30/2015 CT, compatible with a benign adenoma. Stable appearance of the right adrenal gland without discrete right adrenal nodule. Punctate nonobstructing interpolar right and upper left renal stones. No hydronephrosis. Subcentimeter hypodense renal cortical lesion in the lower left kidney is too small to characterize and is unchanged, considered benign. No additional renal lesions. Normal bladder. Stomach/Bowel: Small hiatal hernia. Otherwise normal nondistended stomach. Normal caliber small bowel with no small bowel wall thickening. Appendix not discretely visualized. No pericecal inflammatory changes. Oral contrast transits to the right colon. There is an apple-core lesion in the ascending colon measuring 4.0 x 3.9 cm (series 5/image 60) with irregular annular wall thickening. No additional sites of large bowel wall thickening. No significant colonic diverticulosis. No acute pericolonic fat stranding. Moderate diffuse colonic stool volume. Vascular/Lymphatic: Atherosclerotic nonaneurysmal abdominal aorta. Patent portal, splenic, hepatic and renal veins. Top-normal size 0.6 cm right  mesenteric node (series 3/image 73). No pathologically enlarged lymph nodes in the abdomen or pelvis. Reproductive: Grossly normal uterus.  No adnexal mass. Other: No pneumoperitoneum, ascites or focal fluid collection. Musculoskeletal: No aggressive appearing focal osseous lesions. Marked lumbar spondylosis. IMPRESSION: 1. Central perihilar left upper lobe 5.4 cm lung mass, new since 01/27/2017 chest CT, compatible with malignancy, favor  primary bronchogenic carcinoma. Postobstructive pneumonia/atelectasis in the lingula. 2. Mediastinal adenopathy in the left prevascular/AP window and subcarinal stations, compatible with metastatic nodes (more likely originating from the left upper lobe lung mass). 3. Trace dependent left pleural effusion. 4. Apple-core lesion in the ascending colon, compatible with known primary colonic neoplasm. Adjacent top-normal size right mesenteric lymph node is equivocal for locoregional nodal metastasis. No additional potential findings of metastatic disease in the abdomen or pelvis. 5. Chronic findings include: Aortic Atherosclerosis (ICD10-I70.0). Borderline mild cardiomegaly. One vessel coronary atherosclerosis. Dilated main pulmonary artery, suggesting pulmonary arterial hypertension. Left adrenal adenoma. Punctate bilateral nephrolithiasis. Small hiatal hernia. Electronically Signed   By: Ilona Sorrel M.D.   On: 09/11/2018 16:42   US Venous Img Lower Bilateral  Result Date: 09/08/2018 CLINICAL DATA:  Lower extremity pain EXAM: BILATERAL LOWER EXTREMITY VENOUS DUPLEX ULTRASOUND TECHNIQUE: Doppler venous assessment of the bilateral lower extremity deep venous system was performed, including characterization of spectral flow, compressibility, and phasicity. COMPARISON:  None. FINDINGS: There is complete compressibility of the bilateral common femoral, femoral, and popliteal veins. Doppler analysis demonstrates respiratory phasicity and augmentation of flow with calf compression. No  obvious superficial vein or calf vein thrombosis. IMPRESSION: No evidence of lower extremity DVT. Electronically Signed   By: Marybelle Killings M.D.   On: 09/08/2018 13:27   Dg Chest Portable 1 View  Result Date: 08/30/2018 CLINICAL DATA:  Short of breath EXAM: PORTABLE CHEST 1 VIEW COMPARISON:  07/05/2018 FINDINGS: Mild cardiac enlargement. Negative for heart failure or edema. Mild left lower lobe airspace disease and small left effusion. Right lung base clear. IMPRESSION: Mild left lower lobe atelectasis/infiltrate and small left effusion. Mild progression since the prior study. Electronically Signed   By: Franchot Gallo M.D.   On: 08/30/2018 21:33    ASSESSMENT AND PLAN: 1.  Colorectal adenocarcinoma, non-metastatic  2.  Small cell lung cancer, limited stage (pending brian MRI) 3.  Iron deficiency anemia 4.  Coronary artery disease 5.  Congestive heart failure 6.  Diabetes mellitus 7.  Hypertension 8.  Hypothyroidism  -Discussed new information pathology that was provided to this today.  Pathology consistent with small cell lung cancer.  We again discussed treatment with carboplatin and etoposide.  Recommend for this to begin as an inpatient.  Again reviewed the side effects including but not limited to myelosuppression, nausea and vomiting, renal and liver dysfunction, alopecia.  The patient is agreeable to proceed.  -MRI of the brain will need to be performed to evaluate for brain metastases.  This was scheduled yesterday but could not be performed because the iron infusion that she received interferes with MRI imaging.  We will need to order this in approximately 1 week following the IV iron infusion. -Anemia remained stable.  She has received 2 doses of Feraheme 510 mg during her hospitalization. -The patient is not a candidate for colon cancer surgery due to her very high risk of surgical mortality.  She is aware of the incurable nature of her colon cancer without surgery. -Following  completion of her first cycle of chemotherapy, we will plan to discharge her with outpatient follow-up with [facility].   Mikey Bussing, DNP, AGPCNP-BC, AOCNP 09/17/18   Addendum  I have seen the patient, examined her. I agree with the assessment and and plan and have edited the notes.   Pt was transferred to [facility] to start chemo tomorrow. Lung biopsy path was discussed with pt in detail. If her brain MRI is  negative, then she has limited stage (III) small cell lung cancer, which is potentially curable with concurrent chemo and radiation. I again discussed the benefit and side effects of chemo with pt, including but does not not limited to, fatigue, nausea, vomiting, diarrhea, hair loss, neuropathy, fluid retention, renal and kidney dysfunction, neutropenic fever, needed for blood transfusion, bleeding, etc in great detail. She agrees to proceed. She signed the chemo consent. I have spoke with our pharmacy also. Due to her morbid obesity, her calculated EGFR is around 90 by using her actual weight, so I will use ideal body weight based EGFR and carbo AUC 5 for first cycle, etoposide 137m/m2 for day 1-3. Chemo order signed.   Chemo will start tomorrow 09/18/2018, and complete on Monday 09/20/2018. My on call partner will see her daily on the weekend and I will return on Monday.  Please call me for any questions.   YTruitt Merle 09/17/2018  Cell: [phone]

## 2018-09-17 NOTE — Progress Notes (Signed)
Daily Progress Note   Patient Name: Stephanie Combs       Date: 09/17/2018 DOB: 09/29/1950  Age: 68 y.o. MRN#: [id number] Attending Physician: Hosie Poisson, MD Primary Care Physician: [name], MD Admit Date: 08/30/2018  Reason for Consultation/Follow-up: Establishing goals of care  Subjective: Patient just arrived back to room from radiology. She complains of some increased shortness of breath today with exertion and some chronic aches. Assisted with applying warm packs to lower back and right shoulder as requested. She states she feels weak on today, but did get out of bed and sit up in recliner earlier.   She reports she is thankful for Oncology input regarding care and verbalized her awareness that her biopsy is showing potential lung cancer. Support given. She expressed she was not surprised given her strong family history. She is hopeful she can discharge home with daughter. She states her plans are to follow up with the Oncology team at [facility] for continued care, with a possibility of transferring to [facility] and initiating her first chemo while hospitalized.   We discussed palliative support given her wishes to proceed with chemotherapy. She verbalized understanding and request palliative support once discharged. I offered to update her daughter, however, she reports she will provide updates to her as she does not want to continue calling her without having final decisions. She reports her daughter is already having a hard time not being able to be present during this time due to visitor restrictions. Support given.   All questions answered.   Length of Stay: 17  Current Medications: Scheduled Meds:  . aspirin EC  81 mg Oral Daily  . atorvastatin  80 mg Oral q morning - 10a  .  cholecalciferol  5,000 Units Oral Daily  . furosemide  40 mg Oral Daily  . gabapentin  300 mg Oral QID  . Gerhardt's butt cream  1 application Topical TID  . insulin aspart  0-15 Units Subcutaneous TID WC  . insulin aspart  0-5 Units Subcutaneous QHS  . insulin aspart  5 Units Subcutaneous TID WC  . insulin detemir  32 Units Subcutaneous QHS  . levothyroxine  200 mcg Oral QAC breakfast  . nystatin   Topical BID  . omega-3 acid ethyl esters  1 g Oral Daily  . pantoprazole  40  mg Oral BID  . polyethylene glycol  17 g Oral BID  . senna  2 tablet Oral BID WC  . sodium chloride flush  3 mL Intravenous Q12H  . topiramate  150 mg Oral QPM  . umeclidinium-vilanterol  1 puff Inhalation Daily    Continuous Infusions: . sodium chloride Stopped (09/11/18 1716)  . lactated ringers 10 mL/hr at 09/14/18 1236    PRN Meds: sodium chloride, acetaminophen **OR** acetaminophen, albuterol, ALPRAZolam, benzonatate, bisacodyl, guaiFENesin-dextromethorphan, nitroGLYCERIN, ondansetron, oxyCODONE-acetaminophen **AND** oxyCODONE, sodium chloride flush, tiZANidine  Physical Exam Vitals signs and nursing note reviewed.  Constitutional:      General: She is awake.     Appearance: She is morbidly obese. She is ill-appearing.  Cardiovascular:     Rate and Rhythm: Normal rate.     Pulses: Normal pulses.     Heart sounds: Normal heart sounds.  Pulmonary:     Breath sounds: Decreased breath sounds present.     Comments: Shortness of breath  Abdominal:     General: Bowel sounds are normal.     Palpations: Abdomen is soft.  Skin:    General: Skin is warm and dry.  Neurological:     Mental Status: She is alert and oriented to person, place, and time.     Motor: Weakness present.  Psychiatric:        Speech: Speech normal.        Behavior: Behavior is cooperative.        Cognition and Memory: Cognition normal.        Judgment: Judgment normal.    Vital Signs: BP (!) 126/59 (BP Location: Right Arm)    Pulse 84   Temp 98.2 F (36.8 C) (Oral)   Resp 20   Ht 5\' 6"  (1.676 m)   Wt (!) 139.1 kg   SpO2 97%   BMI 49.49 kg/m  SpO2: SpO2: 97 % O2 Device: O2 Device: Nasal Cannula O2 Flow Rate: O2 Flow Rate (L/min): 2 L/min  Intake/output summary:   Intake/Output Summary (Last 24 hours) at 09/17/2018 1239 Last data filed at 09/17/2018 1218 Gross per 24 hour  Intake 1440 ml  Output 1503 ml  Net -63 ml   LBM: Last BM Date: 09/15/18 Baseline Weight: Weight: (!) 138.3 kg Most recent weight: Weight: (!) 139.1 kg       Palliative Assessment/Data: PPS 40%    Patient Active Problem List   Diagnosis Date Noted  . Malignant neoplasm of hepatic flexure ([place])   . Small cell lung cancer, left upper lobe ([place])   . Colonic mass 09/10/2018  . Colon polyps 09/10/2018  . Cellulitis of leg, left 09/10/2018  . Pain of lower extremity   . Non-intractable vomiting   . Abdominal pain, epigastric   . Heme positive stool   . Acute on chronic diastolic CHF (congestive heart failure) ([place]) 09/06/2018  . Symptomatic anemia 08/31/2018  . Anemia 08/30/2018  . Chronic pain 07/05/2018  . On home O2 07/05/2018  . Opioid dependence ([place]) 07/05/2018  . CAD (coronary artery disease) 05/14/2018  . AKI (acute kidney injury) ([place]) 04/05/2018  . Cellulitis 01/10/2018  . Cellulitis of right lower extremity 01/07/2018  . Altered mental status 01/06/2018  . Type 2 diabetes mellitus with hypoglycemia without coma ([place]) 11/24/2017  . Anxiety 11/24/2017  . Chronic respiratory failure with hypoxia ([place]) 11/24/2017  . Syncope 10/24/2017  . Bradycardia   . Syncope due to orthostatic hypotension 09/04/2017  . Obesity, Class III, BMI 40-49.9 (morbid obesity) (  [place]) 09/04/2017  . Abnormal nuclear stress test 05/19/2017  . Atypical angina ([place]) 05/19/2017  . Edema 05/15/2017  . Skin ulcer ([place]) 01/30/2017  . Tinea cruris 10/10/2016  . Vitamin D deficiency 08/19/2016  . Personal history of noncompliance with medical  treatment, presenting hazards to health 06/17/2016  . CAP (community acquired pneumonia) 07/25/2015  . Pressure ulcer 05/02/2015  . Hyponatremia 05/01/2015  . Acute-on-chronic kidney injury ([place]) 05/01/2015  . Uncontrolled type 2 diabetes mellitus with stage 3 chronic kidney disease ([place]) 05/01/2015  . Cellulitis of foot, right 03/24/2015  . Peripheral edema 12/04/2014  . Acute renal failure superimposed on stage 3 chronic kidney disease ([place]) 11/24/2014  . Bilateral lower extremity edema   . Diabetic ulcer of right great toe ([place])   . Foot ulcer due to secondary DM ([place]) 11/07/2014  . Diabetic ulcer of right foot ([place]) 11/07/2014  . Edema of lower extremity 05/27/2013  . CKD (chronic kidney disease), stage III ([place]) 04/14/2013  . Anemia in CKD (chronic kidney disease) 04/14/2013  . Chest pain 04/14/2013  . Dyspnea 04/14/2013  . Chronic diastolic CHF (congestive heart failure) ([place])   . Hypothyroidism   . COPD (chronic obstructive pulmonary disease) ([place]) 08/07/2012  . Morbid obesity ([place]) 08/07/2012  . DM type 2 (diabetes mellitus, type 2) ([place]) 08/07/2012  . HTN (hypertension), benign 08/07/2012    Palliative Care Assessment & Plan   Patient Profile: Palliative Care consult requested for this 68 y.o. female with multiple medical problems including hypothyroidism, hypertension, chronic kidney disease stage III, type 2 diabetes, chronic lymphedema, diastolic congestive heart failure, CAD s/p DES. She was admitted after concerns for worsening dyspnea and increased lower edema. Since admission she has received 2 units PRBC due to hemoglobin 7.0. 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 She was transferred to [facility] from [facility] for further work-up and surgical evaluation.    Recommendations/Plan:  DNR/DNI  Continue to treat. Patient is open to chemotherapy treatment and hopeful for some stability of disease  with treatment.   Outpatient palliative at discharge.   PMT will continue to support and follow as needed.   Goals of Care and Additional Recommendations:  Limitations on Scope of Treatment: Full Scope Treatment  Code Status:     Code Status Orders  (From admission, onward)         Start     Ordered   08/30/18 2333  Do not attempt resuscitation (DNR)  Continuous    Question Answer Comment  In the event of cardiac or respiratory ARREST Do not call a "code blue"   In the event of cardiac or respiratory ARREST Do not perform Intubation, CPR, defibrillation or ACLS   In the event of cardiac or respiratory ARREST Use medication by any route, position, wound care, and other measures to relive pain and suffering. May use oxygen, suction and manual treatment of airway obstruction as needed for comfort.      08/30/18 2332        Code Status History    Date Active Date Inactive Code Status Order ID Comments User Context   07/05/2018 1421 07/08/2018 2129 DNR [id number]  Murlean Iba, MD ED   04/07/2018 1102 04/09/2018 2146 DNR [id number]  Murlean Iba, MD Inpatient   04/05/2018 2225 04/07/2018 1102 Full Code [id number]  Reubin Milan, MD ED   01/06/2018 2055 01/11/2018 1740 Full Code [id number]  Olevia Bowens,  Gerri Lins, MD ED   11/24/2017 1835 11/26/2017 2018 Full Code [id number]  Barton Dubois, MD Inpatient   10/24/2017 0240 10/25/2017 1926 Full Code [id number]  Vianne Bulls, MD ED   10/14/2017 0039 10/16/2017 1924 DNR [id number]  Oswald Hillock, MD Inpatient   09/02/2017 2321 09/04/2017 2230 Full Code [id number]  Heath Lark D, DO ED   05/15/2017 1821 05/26/2017 1955 DNR [id number]  [name], MD Inpatient   05/15/2017 1821 05/15/2017 1821 Full Code [id number]  [name], MD Inpatient   01/30/2017 1750 02/05/2017 1715 Full Code [id number]  [name], MD Inpatient   10/10/2016 0555 10/10/2016 2121 DNR [id number]  Oswald Hillock, MD Inpatient   12/06/2015 0200 12/06/2015 2147 DNR [id number]  Oswald Hillock, MD Inpatient   07/25/2015 1031 07/27/2015 1956 Full Code [id number]  Orvan Falconer, MD Inpatient   07/25/2015 0827 07/25/2015 1031 DNR [id number]  Orvan Falconer, MD ED   04/30/2015 0636 05/02/2015 2009 DNR [id number]  Oswald Hillock, MD Inpatient   03/24/2015 1843 03/25/2015 1804 DNR [id number]  Truett Mainland, DO Inpatient   12/04/2014 0939 12/06/2014 2115 Full Code [id number]  Samuella Cota, MD Inpatient   11/24/2014 1558 11/28/2014 1944 Full Code [id number]  Radene Gunning, NP Inpatient   11/07/2014 0654 11/10/2014 2231 Full Code [id number]  Orvan Falconer, MD Inpatient   10/02/2013 1939 10/03/2013 0125 Full Code [id number]  Janice Norrie, MD ED   05/26/2013 0620 05/30/2013 1758 DNR [id number]  Oswald Hillock, MD Inpatient   04/14/2013 2318 04/21/2013 1737 Full Code [id number]  Gardiner Barefoot, NP Inpatient   04/14/2013 1726 04/14/2013 2318 Full Code [id number]  Radene Gunning, NP Inpatient   08/07/2012 2304 08/09/2012 2107 Full Code [id number]  Bonnielee Haff, MD Inpatient    Advance Directive Documentation     Most Recent Value  Type of Advance Directive  Living will  Pre-existing out of facility DNR order (yellow form or pink MOST form)  -  "MOST" Form in Place?  -     Prognosis:   Guarded  Discharge Planning:  Home with [place] was discussed with Erasmo Downer, NP (Oncology).   Thank you for allowing the Palliative Medicine Team to assist in the care of this patient.  Total Time: 35 min.   Greater than 50%  of this time was spent counseling and coordinating care related to the above assessment and plan  Alda Lea, AGPCNP-BC Palliative Medicine Team  Phone: [phone] Pager: [phone] Amion: Bjorn Pippin   Please contact Palliative Medicine Team phone at [phone] for questions and concerns.

## 2018-09-18 DIAGNOSIS — I110 Hypertensive heart disease with heart failure: Secondary | ICD-10-CM

## 2018-09-18 DIAGNOSIS — C189 Malignant neoplasm of colon, unspecified: Secondary | ICD-10-CM

## 2018-09-18 DIAGNOSIS — E669 Obesity, unspecified: Secondary | ICD-10-CM

## 2018-09-18 DIAGNOSIS — R82993 Hyperuricosuria: Secondary | ICD-10-CM

## 2018-09-18 DIAGNOSIS — E1129 Type 2 diabetes mellitus with other diabetic kidney complication: Secondary | ICD-10-CM

## 2018-09-18 DIAGNOSIS — D509 Iron deficiency anemia, unspecified: Secondary | ICD-10-CM

## 2018-09-18 DIAGNOSIS — M546 Pain in thoracic spine: Secondary | ICD-10-CM

## 2018-09-18 DIAGNOSIS — C3402 Malignant neoplasm of left main bronchus: Secondary | ICD-10-CM

## 2018-09-18 DIAGNOSIS — I509 Heart failure, unspecified: Secondary | ICD-10-CM

## 2018-09-18 DIAGNOSIS — C3490 Malignant neoplasm of unspecified part of unspecified bronchus or lung: Secondary | ICD-10-CM

## 2018-09-18 DIAGNOSIS — E039 Hypothyroidism, unspecified: Secondary | ICD-10-CM

## 2018-09-18 DIAGNOSIS — N289 Disorder of kidney and ureter, unspecified: Secondary | ICD-10-CM

## 2018-09-18 LAB — CBC WITH DIFFERENTIAL/PLATELET
Abs Immature Granulocytes: 0.03 10*3/uL (ref 0.00–0.07)
Basophils Absolute: 0.1 10*3/uL (ref 0.0–0.1)
Basophils Relative: 2 %
Eosinophils Absolute: 0.5 10*3/uL (ref 0.0–0.5)
Eosinophils Relative: 7 %
HCT: 29 % — ABNORMAL LOW (ref 36.0–46.0)
Hemoglobin: 8 g/dL — ABNORMAL LOW (ref 12.0–15.0)
Immature Granulocytes: 0 %
Lymphocytes Relative: 27 %
Lymphs Abs: 2 10*3/uL (ref 0.7–4.0)
MCH: 25.2 pg — ABNORMAL LOW (ref 26.0–34.0)
MCHC: 27.6 g/dL — ABNORMAL LOW (ref 30.0–36.0)
MCV: 91.2 fL (ref 80.0–100.0)
Monocytes Absolute: 0.6 10*3/uL (ref 0.1–1.0)
Monocytes Relative: 8 %
Neutro Abs: 4.1 10*3/uL (ref 1.7–7.7)
Neutrophils Relative %: 56 %
Platelets: 281 10*3/uL (ref 150–400)
RBC: 3.18 MIL/uL — ABNORMAL LOW (ref 3.87–5.11)
RDW: 22 % — ABNORMAL HIGH (ref 11.5–15.5)
WBC: 7.3 10*3/uL (ref 4.0–10.5)
nRBC: 0 % (ref 0.0–0.2)

## 2018-09-18 LAB — COMPREHENSIVE METABOLIC PANEL
ALT: 10 U/L (ref 0–44)
AST: 13 U/L — ABNORMAL LOW (ref 15–41)
Albumin: 3.2 g/dL — ABNORMAL LOW (ref 3.5–5.0)
Alkaline Phosphatase: 97 U/L (ref 38–126)
Anion gap: 10 (ref 5–15)
BUN: 41 mg/dL — ABNORMAL HIGH (ref 8–23)
CO2: 28 mmol/L (ref 22–32)
Calcium: 8.6 mg/dL — ABNORMAL LOW (ref 8.9–10.3)
Chloride: 97 mmol/L — ABNORMAL LOW (ref 98–111)
Creatinine, Ser: 1.34 mg/dL — ABNORMAL HIGH (ref 0.44–1.00)
GFR calc Af Amer: 47 mL/min — ABNORMAL LOW (ref >60)
GFR calc non Af Amer: 41 mL/min — ABNORMAL LOW (ref >60)
Glucose, Bld: 317 mg/dL — ABNORMAL HIGH (ref 70–99)
Potassium: 4.3 mmol/L (ref 3.5–5.1)
Sodium: 135 mmol/L (ref 135–145)
Total Bilirubin: 0.6 mg/dL (ref 0.3–1.2)
Total Protein: 6.1 g/dL — ABNORMAL LOW (ref 6.5–8.1)

## 2018-09-18 LAB — GLUCOSE, CAPILLARY
Glucose-Capillary: 257 mg/dL — ABNORMAL HIGH (ref 70–99)
Glucose-Capillary: 157 mg/dL — ABNORMAL HIGH (ref 70–99)
Glucose-Capillary: 196 mg/dL — ABNORMAL HIGH (ref 70–99)
Glucose-Capillary: 305 mg/dL — ABNORMAL HIGH (ref 70–99)
Glucose-Capillary: 334 mg/dL — ABNORMAL HIGH (ref 70–99)

## 2018-09-18 LAB — URIC ACID: Uric Acid, Serum: 7.5 mg/dL — ABNORMAL HIGH (ref 2.5–7.1)

## 2018-09-18 MED ORDER — SODIUM CHLORIDE 0.9 % IV SOLN
100.0000 mg/m2 | Freq: Once | INTRAVENOUS | Status: AC
  Administered 2018-09-18: 260 mg via INTRAVENOUS
  Filled 2018-09-18: qty 13

## 2018-09-18 MED ORDER — SODIUM CHLORIDE 0.9 % IV SOLN
10.0000 mg | Freq: Once | INTRAVENOUS | Status: AC
  Administered 2018-09-18: 10 mg via INTRAVENOUS
  Filled 2018-09-18: qty 1

## 2018-09-18 MED ORDER — SODIUM CHLORIDE 0.9% FLUSH: 3.0000 mL | INTRAVENOUS | Status: DC | PRN

## 2018-09-18 MED ORDER — SODIUM CHLORIDE 0.9% FLUSH: 10.0000 mL | INTRAVENOUS | Status: DC | PRN

## 2018-09-18 MED ORDER — HOT PACK MISC ONCOLOGY
1.0000 | Freq: Once | Status: AC | PRN
  Filled 2018-09-18: qty 1

## 2018-09-18 MED ORDER — SODIUM CHLORIDE 0.9 % IV SOLN: 250.0000 mL | INTRAVENOUS | Status: DC | PRN

## 2018-09-18 MED ORDER — ALLOPURINOL 100 MG PO TABS
100.0000 mg | ORAL_TABLET | Freq: Every day | ORAL | Status: DC
  Administered 2018-09-18 – 2018-09-19 (×2): 100 mg via ORAL
  Filled 2018-09-18 (×2): qty 1

## 2018-09-18 MED ORDER — ALTEPLASE 2 MG IJ SOLR
2.0000 mg | Freq: Once | INTRAMUSCULAR | Status: DC | PRN
  Filled 2018-09-18: qty 2

## 2018-09-18 MED ORDER — HEPARIN SOD (PORK) LOCK FLUSH 100 UNIT/ML IV SOLN
250.0000 [IU] | Freq: Once | INTRAVENOUS | Status: DC | PRN
  Filled 2018-09-18: qty 2.5

## 2018-09-18 MED ORDER — SODIUM CHLORIDE 0.9 % IV SOLN
400.0000 mg | Freq: Once | INTRAVENOUS | Status: AC
  Administered 2018-09-18: 400 mg via INTRAVENOUS
  Filled 2018-09-18: qty 40

## 2018-09-18 MED ORDER — HEPARIN SOD (PORK) LOCK FLUSH 100 UNIT/ML IV SOLN
500.0000 [IU] | Freq: Once | INTRAVENOUS | Status: DC | PRN
  Filled 2018-09-18: qty 5

## 2018-09-18 MED ORDER — SODIUM CHLORIDE 0.9 % IV SOLN
10.0000 mg | Freq: Once | INTRAVENOUS | Status: AC
  Administered 2018-09-19: 10 mg via INTRAVENOUS
  Filled 2018-09-18: qty 1

## 2018-09-18 MED ORDER — SODIUM CHLORIDE 0.9 % IV SOLN
Freq: Once | INTRAVENOUS | Status: AC
  Administered 2018-09-18: 12:00:00 via INTRAVENOUS

## 2018-09-18 MED ORDER — SODIUM CHLORIDE 0.9 % IV SOLN
250.0000 mL | INTRAVENOUS | Status: DC
  Administered 2018-09-21: 250 mL via INTRAVENOUS

## 2018-09-18 MED ORDER — PALONOSETRON HCL INJECTION 0.25 MG/5ML
0.2500 mg | Freq: Once | INTRAVENOUS | Status: AC
  Administered 2018-09-18: 0.25 mg via INTRAVENOUS
  Filled 2018-09-18: qty 5

## 2018-09-18 MED ORDER — FUROSEMIDE 40 MG PO TABS
40.0000 mg | ORAL_TABLET | Freq: Two times a day (BID) | ORAL | Status: DC
  Administered 2018-09-18 – 2018-09-22 (×8): 40 mg via ORAL
  Filled 2018-09-18 (×8): qty 1

## 2018-09-18 MED ORDER — SODIUM CHLORIDE 0.9 % IV SOLN
100.0000 mg/m2 | Freq: Once | INTRAVENOUS | Status: AC
  Administered 2018-09-19: 260 mg via INTRAVENOUS
  Filled 2018-09-18: qty 13

## 2018-09-18 MED ORDER — SODIUM CHLORIDE 0.9 % IV SOLN: 509.4000 mg | Freq: Once | INTRAVENOUS | Status: DC

## 2018-09-18 NOTE — Progress Notes (Signed)
IP PROGRESS NOTE  Subjective:   Stephanie Combs has been diagnosed with small cell lung cancer and synchronous colon cancer.  She is scheduled to begin cycle 1 chemotherapy for lung cancer today. She complains of a cough.  She has pain at the right upper back near the shoulder.  Objective: Vital signs in last 24 hours: Blood pressure (!) 142/54, pulse 61, temperature 98.4 F (36.9 C), temperature source Oral, resp. rate 16, height 5\' 6"  (1.676 m), weight (!) 313 lb 0.9 oz (142 kg), SpO2 96 %.  Intake/Output from previous day: 05/08 0701 - 05/09 0700 In: 80 [P.O.:940] Out: 2452 [Urine:2450; Stool:2]  Physical Exam:  HEENT: No thrush Lungs: Clear bilaterally, no respiratory distress Cardiac: Irregular Abdomen: Obese, no hepatomegaly Extremities: Chronic stasis change at the lower leg and foot bilaterally    Lab Results: Recent Labs    09/17/18 0855 09/18/18 0602  WBC 7.5 7.3  HGB 7.5* 8.0*  HCT 25.5* 29.0*  PLT 269 281    BMET Recent Labs    09/17/18 0855 09/18/18 0602  NA 136 135  K 4.0 4.3  CL 99 97*  CO2 27 28  GLUCOSE 226* 317*  BUN 40* 41*  CREATININE 1.28* 1.34*  CALCIUM 8.7* 8.6*  Uric acid 7.5  Lab Results  Component Value Date   CEA1 2.1 09/10/2018    Studies/Results: Dg Chest 2 View  Result Date: 09/17/2018 CLINICAL DATA:  Recent onset of cough. EXAM: CHEST - 2 VIEW COMPARISON:  Chest CT from 6 days ago FINDINGS: Left hilar mass with left upper lobe collapse, new. Normal heart size when accounting for rotation and low volumes. IMPRESSION: Collapse of the left upper lobe in this patient with known hilar mass. Electronically Signed   By: Monte Fantasia M.D.   On: 09/17/2018 11:18    Medications: I have reviewed the patient's current medications.  Assessment/Plan: 1.  Small cell lung cancer- limited stage  Cycle 1 etoposide/carboplatin 09/18/2018 2.  Adenocarcinoma of the right colon 3.  Iron deficiency anemia 4.  Coronary artery disease 5.   Congestive heart failure 6.  Diabetes 7.  Hypertension 8.  Hypothyroidism 9.  Renal insufficiency 10. Obesity  Stephanie Combs has been diagnosed with synchronous lung and colon cancers.  She will begin cycle 1 etoposide/carboplatin chemotherapy today.  I discussed the treatment plan with her.  She agrees to proceed.  I added allopurinol as the uric acid is mildly elevated.  We will check a chemistry panel and uric acid tomorrow.  I will add gentle intravenous hydration with the history of CHF and renal insufficiency.  Stephanie Combs will follow-up at the [facility] following discharge from the hospital.   LOS: 18 days   Betsy Coder, MD   09/18/2018, 7:56 AM

## 2018-09-18 NOTE — Progress Notes (Signed)
PROGRESS NOTE    Stephanie Combs  ZOX:[id number] DOB: Mar 26, 1951 DOA: 08/30/2018 PCP: [name], MD    Brief Narrative:  68 year old female who presented with dyspnea and lower extremity edema. Does have significant past medical history for coronary artery disease, diastolic heart failure, type 2 diabetes mellitus, chronic kidney disease stage III, hypertension, hypothyroidism and chronic lymphedema. She reported 5 days of worsening dyspnea along with lower extremity edema and dry cough. Symptoms were refractory to increased dose of oral furosemide. On her initial physical examination she was afebrile, pulse rate 84, respirate 24, blood pressure 130/100, oxygenation 100% on supplemental oxygen. Her lungs are clear to auscultation bilaterally, heart S1-S2 present, rhythmic, no gallops, rubs or murmurs, the abdomen was protuberant, nontender, no lower extremity edema. Sodium 138, potassium 4.2, chloride 102, bicarb 27, glucose 241, BUN 23, creatinine 1.58, AST 13, ALT 11, iron 9, TIBC 431, transferrin saturation 2, ferritin 4, white count 7.7, hemoglobin 7.0, hematocrit 35.0, platelets 377.  Patient was admitted to the hospital with a working diagnosis of symptomatic anemia and decompensated heart failure.  Patient received 2 units packed red blood cells with improvement of her hemoglobin and hematocrit.  Patient was successfully diuresed with IV furosemide, with improvement of her symptoms.  Further work-up with colonoscopy April 30 showed a colonic mass. [facility].  Patient was found to have a large left hilar mass, consulted pulmonary and patient underwent bronchoscopy today plus biopsy.   Diagnosed with synchronous lung and colon cancer, transfer to [facility] for chemotherapy and brain MRI.   Assessment & Plan:   Principal Problem:   Anemia Active Problems:   DM type 2 (diabetes mellitus, type 2) (HCC)   HTN  (hypertension), benign   CKD (chronic kidney disease), stage III (HCC)   Hypothyroidism   Bilateral lower extremity edema   Uncontrolled type 2 diabetes mellitus with stage 3 chronic kidney disease (H[facility])   Obesity, Class III, BMI 40-49.9 (morbid obesity) (HCC)   CAD (coronary artery disease)   Symptomatic anemia   Acute on chronic diastolic CHF (congestive heart failure) (HCC)   Heme positive stool   Non-intractable vomiting   Abdominal pain, epigastric   Pain of lower extremity   Colonic mass   Colon polyps   Cellulitis of leg, left   Malignant neoplasm of hepatic flexure (HCC)   Small cell lung cancer, left upper lobe ([place])   1. Synchronous lung and colon cancer. Transferred to [facility] for chemotherapy per Dr. Benay Spice, continue therapy as [facility] at [facility]. Continue to follow oncology recommendations.    2. Symptomatic iron deficiency anemia due to colonic cancer/ adenocarcinoma.Severe iron deficiency(serum iron at 9.0, TIBC at 413, ferritin 4 and transferrin saturation of 2). Patient has received 2 units prbc transfusion and IV iron infusionx1.Tolerating po well with no signs of bowel obstruction, continue chemotherapy per oncology recommendations. Stable Hgb at 8.0 with Hct at 29.   3. Decompensated diastolic heart failure, acute on chronic.EF 60 to 65% with moderately increased LV wall thickness.  Persistent edema of her lower extremities, no frank signs of pulmonary edema, continue diuresis with furosemide 40 mg po bid.   3.Left leg cellulitis. Resolved. She has a component of vascular insufficiency   4. Morbid obesity and dyslipidemia. Her BMI is 50.5  5. T2DM.Continue withbasal insulin with 32 units daily of levimir,5 units pre-meal, plus sliding scale insulin, for glucose cover and monitoring.   6. Hypothyroid.Continue withlevothyroxine.   7.  AKI on CKD stage 3. Clinically continue hypervolemic, will continue furosemide 40 mg po bid, with close  follow up on renal function and electrolytes. Serum cr today at 1,34 with K at 4,3.   DVT prophylaxis:heparin Code Status:full Family Communication:no family at the bedside Disposition Plan/ discharge barriers:getting chemotherapy.   Body mass index is 50.53 kg/m. Malnutrition Type:      Malnutrition Characteristics:      Nutrition Interventions:     RN Pressure Injury Documentation:     Consultants:   Oncology   Surgery   Pulmonology   Procedures:   Colonoscopy with biopsy   Bronchoscopy with biopsy     Antimicrobials:       Subjective: Patient is feeling very weak and deconditioned, continue to have lower extremity edema, no nausea or vomiting, no chest pain or dyspnea.   Objective: Vitals:   09/17/18 2050 09/18/18 0406 09/18/18 0517 09/18/18 0908  BP: 127/79  (!) 142/54   Pulse: 86  61   Resp: 20  16   Temp: 98.8 F (37.1 C)  98.4 F (36.9 C)   TempSrc: Oral  Oral   SpO2: 96%  96% 98%  Weight:  (!) 142 kg    Height:        Intake/Output Summary (Last 24 hours) at 09/18/2018 1101 Last data filed at 09/18/2018 0900 Gross per 24 hour  Intake 1060 ml  Output 2452 ml  Net -1392 ml   Filed Weights   09/16/18 0618 09/17/18 0608 09/18/18 0406  Weight: (!) 139.3 kg (!) 139.1 kg (!) 142 kg    Examination:   General: Not in pain or dyspnea  Neurology: Awake and alert, non focal  E ENT: mild pallor, no icterus, oral mucosa moist Cardiovascular: No JVD. S1-S2 present, rhythmic, no gallops, rubs, or murmurs. +++ bilateral lower extremity edema. Pulmonary: positive breath sounds bilaterally, decreased air movement, no wheezing, rhonchi or rales. Gastrointestinal. Abdomen protuberant with, no organomegaly, non tender, no rebound or guarding Skin. No rashes Musculoskeletal: no joint deformities     Data Reviewed: I have personally reviewed following labs and imaging studies  CBC: Recent Labs  Lab 09/12/18 0458 09/15/18 0514  09/17/18 0855 09/18/18 0602  WBC 8.0 7.0 7.5 7.3  NEUTROABS  --   --   --  4.1  HGB 7.7* 7.6* 7.5* 8.0*  HCT 26.9* 25.5* 25.5* 29.0*  MCV 86.8 86.1 87.9 91.2  PLT 267 267 269 022   Basic Metabolic Panel: Recent Labs  Lab 09/13/18 0554 09/14/18 0354 09/15/18 0514 09/17/18 0855 09/18/18 0602  NA 135 136 137 136 135  K 4.4 3.9 4.4 4.0 4.3  CL 99 96* 102 99 97*  CO2 28 29 27 27 28   GLUCOSE 197* 188* 243* 226* 317*  BUN 35* 42* 37* 40* 41*  CREATININE 1.44* 1.43* 1.35* 1.28* 1.34*  CALCIUM 8.4* 8.9 8.8* 8.7* 8.6*   GFR: Estimated Creatinine Clearance: 58.6 mL/min (A) (by C-G formula based on SCr of 1.34 mg/dL (H)). Liver Function Tests: Recent Labs  Lab 09/18/18 0602  AST 13*  ALT 10  ALKPHOS 97  BILITOT 0.6  PROT 6.1*  ALBUMIN 3.2*   No results for input(s): LIPASE, AMYLASE in the last 168 hours. No results for input(s): AMMONIA in the last 168 hours. Coagulation Profile: No results for input(s): INR, PROTIME in the last 168 hours. Cardiac Enzymes: No results for input(s): CKTOTAL, CKMB, CKMBINDEX, TROPONINI in the last 168 hours. BNP (last 3 results) No results for input(s):  PROBNP in the last 8760 hours. HbA1C: No results for input(s): HGBA1C in the last 72 hours. CBG: Recent Labs  Lab 09/17/18 0628 09/17/18 1136 09/17/18 1603 09/17/18 2048 09/18/18 0737  GLUCAP 175* 162* 177* 181* 257*   Lipid Profile: No results for input(s): CHOL, HDL, LDLCALC, TRIG, CHOLHDL, LDLDIRECT in the last 72 hours. Thyroid Function Tests: No results for input(s): TSH, T4TOTAL, FREET4, T3FREE, THYROIDAB in the last 72 hours. Anemia Panel: No results for input(s): VITAMINB12, FOLATE, FERRITIN, TIBC, IRON, RETICCTPCT in the last 72 hours.    Radiology Studies: I have reviewed all of the imaging during this hospital visit personally     Scheduled Meds: . allopurinol  100 mg Oral Daily  . aspirin EC  81 mg Oral Daily  . atorvastatin  80 mg Oral q morning - 10a  .  CARBOplatin  400 mg Intravenous Once  . cholecalciferol  5,000 Units Oral Daily  . etoposide  100 mg/m2 (Treatment Plan Recorded) Intravenous Once  . [START ON 09/19/2018] etoposide  100 mg/m2 (Treatment Plan Recorded) Intravenous Once  . furosemide  40 mg Oral Daily  . gabapentin  300 mg Oral QID  . Gerhardt's butt cream  1 application Topical TID  . insulin aspart  0-15 Units Subcutaneous TID WC  . insulin aspart  0-5 Units Subcutaneous QHS  . insulin aspart  5 Units Subcutaneous TID WC  . insulin detemir  32 Units Subcutaneous QHS  . levothyroxine  200 mcg Oral QAC breakfast  . nystatin   Topical BID  . omega-3 acid ethyl esters  1 g Oral Daily  . palonosetron  0.25 mg Intravenous Once  . pantoprazole  40 mg Oral BID  . polyethylene glycol  17 g Oral BID  . senna  2 tablet Oral BID WC  . sodium chloride flush  3 mL Intravenous Q12H  . topiramate  150 mg Oral QPM  . umeclidinium-vilanterol  1 puff Inhalation Daily   Continuous Infusions: . sodium chloride    . sodium chloride    . dexamethasone (DECADRON) IVPB ([facility])    . [START ON 09/19/2018] dexamethasone (DECADRON) IVPB ([facility])    . lactated ringers 10 mL/hr at 09/14/18 1236     LOS: 18 days        Mauricio Gerome Apley, MD

## 2018-09-18 NOTE — Progress Notes (Signed)
PT Cancellation Note  Patient Details Name: Stephanie Combs MRN: [id number] DOB: 1950-06-01   Cancelled Treatment:    Reason Eval/Treat Not Completed: Fatigue/lethargy limiting ability to participate. Pt declined to participate with PT. Will check back another day.    Weston Anna, PT Acute Rehabilitation Services Pager: [phone] Office: [phone]

## 2018-09-18 NOTE — Plan of Care (Signed)

## 2018-09-18 NOTE — Progress Notes (Signed)
Chemo teaching completed with patient. Chemotherapy and You book provided to patient as well as handouts for chemo to be administered. Paper provided for patient to write down questions as needed. Consent signed and placed in chart.

## 2018-09-18 NOTE — Progress Notes (Signed)
Chemo dosages and calculations verified by 2 chemo RNs.

## 2018-09-18 NOTE — Plan of Care (Signed)
09/18/18  For Carboplatin dose use AUC 5 with adjusted CrCl 55 ml/min due to obesity, renal function, and age.  New dose = 400 mg  Dr Benay Spice will add allopurinol to medications to account for elevated uric acid and potential for tumor lysis with chemotherapy.  T.O. Dr. Lavonda Jumbo, PharmD

## 2018-09-19 ENCOUNTER — Other Ambulatory Visit: Payer: Self-pay | Admitting: Oncology

## 2018-09-19 DIAGNOSIS — C3412 Malignant neoplasm of upper lobe, left bronchus or lung: Principal | ICD-10-CM

## 2018-09-19 DIAGNOSIS — C182 Malignant neoplasm of ascending colon: Secondary | ICD-10-CM

## 2018-09-19 DIAGNOSIS — Z66 Do not resuscitate: Secondary | ICD-10-CM

## 2018-09-19 DIAGNOSIS — J90 Pleural effusion, not elsewhere classified: Secondary | ICD-10-CM

## 2018-09-19 DIAGNOSIS — Z79899 Other long term (current) drug therapy: Secondary | ICD-10-CM

## 2018-09-19 DIAGNOSIS — M069 Rheumatoid arthritis, unspecified: Secondary | ICD-10-CM

## 2018-09-19 DIAGNOSIS — E790 Hyperuricemia without signs of inflammatory arthritis and tophaceous disease: Secondary | ICD-10-CM

## 2018-09-19 LAB — COMPREHENSIVE METABOLIC PANEL
ALT: 10 U/L (ref 0–44)
AST: <5 U/L — ABNORMAL LOW (ref 15–41)
Albumin: 3 g/dL — ABNORMAL LOW (ref 3.5–5.0)
Alkaline Phosphatase: 91 U/L (ref 38–126)
Anion gap: 10 (ref 5–15)
BUN: 46 mg/dL — ABNORMAL HIGH (ref 8–23)
CO2: 28 mmol/L (ref 22–32)
Calcium: 8.6 mg/dL — ABNORMAL LOW (ref 8.9–10.3)
Chloride: 97 mmol/L — ABNORMAL LOW (ref 98–111)
Creatinine, Ser: 1.28 mg/dL — ABNORMAL HIGH (ref 0.44–1.00)
GFR calc Af Amer: 50 mL/min — ABNORMAL LOW (ref >60)
GFR calc non Af Amer: 43 mL/min — ABNORMAL LOW (ref >60)
Glucose, Bld: 292 mg/dL — ABNORMAL HIGH (ref 70–99)
Potassium: 4.3 mmol/L (ref 3.5–5.1)
Sodium: 135 mmol/L (ref 135–145)
Total Bilirubin: 0.6 mg/dL (ref 0.3–1.2)
Total Protein: 5.9 g/dL — ABNORMAL LOW (ref 6.5–8.1)

## 2018-09-19 LAB — URIC ACID: Uric Acid, Serum: 9 mg/dL — ABNORMAL HIGH (ref 2.5–7.1)

## 2018-09-19 LAB — GLUCOSE, CAPILLARY
Glucose-Capillary: 265 mg/dL — ABNORMAL HIGH (ref 70–99)
Glucose-Capillary: 280 mg/dL — ABNORMAL HIGH (ref 70–99)
Glucose-Capillary: 258 mg/dL — ABNORMAL HIGH (ref 70–99)
Glucose-Capillary: 357 mg/dL — ABNORMAL HIGH (ref 70–99)

## 2018-09-19 LAB — PHOSPHORUS: Phosphorus: 4.7 mg/dL — ABNORMAL HIGH (ref 2.5–4.6)

## 2018-09-19 MED ORDER — ALPRAZOLAM 1 MG PO TABS
1.0000 mg | ORAL_TABLET | Freq: Three times a day (TID) | ORAL | Status: DC | PRN
  Administered 2018-09-19 – 2018-09-22 (×9): 1 mg via ORAL
  Filled 2018-09-19 (×10): qty 1

## 2018-09-19 MED ORDER — SODIUM CHLORIDE 0.9 % IV SOLN
6.0000 mg | Freq: Once | INTRAVENOUS | Status: AC
  Administered 2018-09-19: 6 mg via INTRAVENOUS
  Filled 2018-09-19 (×2): qty 4

## 2018-09-19 MED ORDER — ALPRAZOLAM 0.5 MG PO TABS
0.5000 mg | ORAL_TABLET | Freq: Once | ORAL | Status: AC
  Administered 2018-09-19: 0.5 mg via ORAL

## 2018-09-19 MED ORDER — ALLOPURINOL 300 MG PO TABS
300.0000 mg | ORAL_TABLET | Freq: Every day | ORAL | Status: DC
  Administered 2018-09-20 – 2018-09-22 (×3): 300 mg via ORAL
  Filled 2018-09-19 (×3): qty 1

## 2018-09-19 MED ORDER — MORPHINE SULFATE 2 MG/ML IJ SOLN: 2.0000 mg | INTRAMUSCULAR | Status: DC | PRN

## 2018-09-19 MED ORDER — SODIUM CHLORIDE 0.9 % IV SOLN
8.0000 mg | Freq: Three times a day (TID) | INTRAVENOUS | Status: DC
  Administered 2018-09-19 – 2018-09-22 (×10): 8 mg via INTRAVENOUS
  Filled 2018-09-19 (×12): qty 4

## 2018-09-19 MED ORDER — ALPRAZOLAM 0.5 MG PO TABS
0.5000 mg | ORAL_TABLET | Freq: Three times a day (TID) | ORAL | Status: DC | PRN
  Administered 2018-09-19: 0.5 mg via ORAL
  Filled 2018-09-19: qty 1

## 2018-09-19 MED ORDER — PROCHLORPERAZINE EDISYLATE 10 MG/2ML IJ SOLN: 5.0000 mg | Freq: Four times a day (QID) | INTRAMUSCULAR | Status: DC | PRN

## 2018-09-19 NOTE — Progress Notes (Signed)
Pt c/o chest pain 10/10 in bilateral upper chest. VS obtained and stable. Nitro tab adminstered. MD paged. EKG completed and read NSR. Pain was then 8/10.  Nitro administered again and pain down to 6. Will continue to monitor closely.

## 2018-09-19 NOTE — Progress Notes (Signed)
Chemo dosages and calculations verified by 2 chemo RNs on Saturday, May 9th.

## 2018-09-19 NOTE — Progress Notes (Signed)
Called re rise in uric acid.ote for rasburicase, incresed allopurinol, and will monitor for tumor lysis.

## 2018-09-19 NOTE — Progress Notes (Addendum)
PROGRESS NOTE    Stephanie Combs  YSA:[id number] DOB: [birth year] DOA: 08/30/2018 PCP: [name], MD    Brief Narrative:  68 year old female who presented with dyspnea and lower extremity edema. Does have significant past medical history for coronary artery disease, diastolic heart failure, type 2 diabetes mellitus, chronic kidney disease stage III, hypertension, hypothyroidism and chronic lymphedema. She reported 5 days of worsening dyspnea along with lower extremity edema and dry cough. Symptoms were refractory to increased dose of oral furosemide. On her initial physical examination she was afebrile, pulse rate 84, respirate 24, blood pressure 130/100, oxygenation 100% on supplemental oxygen. Her lungs are clear to auscultation bilaterally, heart S1-S2 present, rhythmic, no gallops, rubs or murmurs, the abdomen was protuberant, nontender, no lower extremity edema. Sodium 138, potassium 4.2, chloride 102, bicarb 27, glucose 241, BUN 23, creatinine 1.58, AST 13, ALT 11, iron 9, TIBC 431, transferrin saturation 2, ferritin 4, white count 7.7, hemoglobin 7.0, hematocrit 35.0, platelets 377.  Patient was admitted to the hospital with a working diagnosis of symptomatic anemia and decompensated heart failure.  Patient received 2 units packed red blood cells with improvement of her hemoglobin and hematocrit.  Patient was successfully diuresed with IV furosemide, with improvement of her symptoms.  Further work-up with colonoscopy April 30 showed a colonic mass. [facility].  Patient was found to have a large left hilar mass, consulted pulmonary and patient underwent bronchoscopy today plus biopsy.  Diagnosed with synchronous lung and colon cancer, transfer to [facility] for chemotherapy and brain MRI.    Assessment & Plan:   Principal Problem:   Anemia Active Problems:   DM type 2 (diabetes mellitus, type 2) (HCC)   HTN  (hypertension), benign   CKD (chronic kidney disease), stage III (HCC)   Hypothyroidism   Bilateral lower extremity edema   Uncontrolled type 2 diabetes mellitus with stage 3 chronic kidney disease (HCC)   Obesity, Class III, BMI 40-49.9 (morbid obesity) (HCC)   CAD (coronary artery disease)   Symptomatic anemia   Acute on chronic diastolic CHF (congestive heart failure) (HCC)   Heme positive stool   Non-intractable vomiting   Abdominal pain, epigastric   Pain of lower extremity   Colonic mass   Colon polyps   Cellulitis of leg, left   Malignant neoplasm of hepatic flexure (HCC)   Small cell lung cancer, left upper lobe ([place])   1. 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 Tolerating well chemotherapy, her uric acid has been elevated, and has received rasburicase, follow on renal panel and uric acid in am.  Serum P today at 4,7.   2. Symptomatic iron deficiency anemia due to colonic cancer/ adenocarcinoma.Severe iron deficiency(serum iron at 9.0, TIBC at 413, ferritin 4 and transferrin saturation of 2). Patient has received 2 units prbc transfusion and IV iron infusionx1.Patient is tolerating po well, continue chemotherapy. She has been deemed not candidate for surgery.   3. Decompensated diastolic heart failure, acute on chronic.EF 60 to 65% with moderately increased LV wall thickness. Continue diuresis with furosemide 40 mg po bid, persistent edema in her lower extremities.   3.Left leg cellulitis. completed therapy. Positive component of venous insufficiency.   4. Morbid obesity and dyslipidemia. BMI is 50.5  5. T2DM.lnsulin wlevimir 32 units daily plus5 units pre-meal, plus sliding scale insulin, for glucose cover and monitoring. Tolerating po well.   6. Hypothyroid.Onlevothyroxine.   7. AKI  on CKD stage 3. Renal function with serum cr at 1,28 with K at 4,3 and serum  bicarbonate at 28, will continue to follow on renal panel in am, avoid hypotension or nephrotoxic agents.   DVT prophylaxis:heparin Code Status:full Family Communication:no family at the bedside Disposition Plan/ discharge barriers:getting chemotherapy.    Body mass index is 51.1 kg/m. Malnutrition Type:      Malnutrition Characteristics:      Nutrition Interventions:     RN Pressure Injury Documentation:     Consultants:   Oncology   Surgery   Pulmonology   Procedures:   Colonoscopy with biopsy   Bronchoscopy with biopsy     Antimicrobials:       Subjective: Patient had chest pain this am improved with nitroglycerin, moderate in intensity, with no nausea or vomiting. Continue to have lower extremity edema.   Objective: Vitals:   09/18/18 1532 09/18/18 2032 09/19/18 0651 09/19/18 0932  BP: (!) 151/71 139/62 115/75 (!) 132/58  Pulse: 78 72 62 77  Resp: 20 18 19 18   Temp: 98.6 F (37 C) 98.5 F (36.9 C) 98.2 F (36.8 C) (!) 97.3 F (36.3 C)  TempSrc: Oral Oral Oral   SpO2: 96% 97% 98% 97%  Weight:   (!) 143.6 kg   Height:        Intake/Output Summary (Last 24 hours) at 09/19/2018 1203 Last data filed at 09/19/2018 0745 Gross per 24 hour  Intake 480 ml  Output 1926 ml  Net -1446 ml   Filed Weights   09/17/18 0608 09/18/18 0406 09/19/18 0651  Weight: (!) 139.1 kg (!) 142 kg (!) 143.6 kg    Examination:   General: Not in pain or dyspnea, deconditioned  Neurology: Awake and alert, non focal  E ENT: mild pallor, no icterus, oral mucosa moist Cardiovascular: No JVD. S1-S2 present, rhythmic, no gallops, rubs, or murmurs. +++ non pitting lower extremity edema. Pulmonary: positive breath sounds bilaterally, adequate air movement, no wheezing, rhonchi or rales. Gastrointestinal. Abdomen with no organomegaly, non tender, no rebound or guarding Skin. No rashes Musculoskeletal: no joint deformities     Data Reviewed: I have  personally reviewed following labs and imaging studies  CBC: Recent Labs  Lab 09/15/18 0514 09/17/18 0855 09/18/18 0602  WBC 7.0 7.5 7.3  NEUTROABS  --   --  4.1  HGB 7.6* 7.5* 8.0*  HCT 25.5* 25.5* 29.0*  MCV 86.1 87.9 91.2  PLT 267 269 827   Basic Metabolic Panel: Recent Labs  Lab 09/14/18 0354 09/15/18 0514 09/17/18 0855 09/18/18 0602 09/19/18 0900  NA 136 137 136 135 135  K 3.9 4.4 4.0 4.3 4.3  CL 96* 102 99 97* 97*  CO2 29 27 27 28 28   GLUCOSE 188* 243* 226* 317* 292*  BUN 42* 37* 40* 41* 46*  CREATININE 1.43* 1.35* 1.28* 1.34* 1.28*  CALCIUM 8.9 8.8* 8.7* 8.6* 8.6*  PHOS  --   --   --   --  4.7*   GFR: Estimated Creatinine Clearance: 61.8 mL/min (A) (by C-G formula based on SCr of 1.28 mg/dL (H)). Liver Function Tests: Recent Labs  Lab 09/18/18 0602 09/19/18 0900  AST 13* <5*  ALT 10 10  ALKPHOS 97 91  BILITOT 0.6 0.6  PROT 6.1* 5.9*  ALBUMIN 3.2* 3.0*   No results for input(s): LIPASE, AMYLASE in the last 168 hours. No results for input(s): AMMONIA in the last 168 hours. Coagulation Profile: No results for input(s): INR, PROTIME in the last  168 hours. Cardiac Enzymes: No results for input(s): CKTOTAL, CKMB, CKMBINDEX, TROPONINI in the last 168 hours. BNP (last 3 results) No results for input(s): PROBNP in the last 8760 hours. HbA1C: No results for input(s): HGBA1C in the last 72 hours. CBG: Recent Labs  Lab 09/18/18 1128 09/18/18 1617 09/18/18 2035 09/18/18 2300 09/19/18 0752  GLUCAP 157* 196* 305* 334* 265*   Lipid Profile: No results for input(s): CHOL, HDL, LDLCALC, TRIG, CHOLHDL, LDLDIRECT in the last 72 hours. Thyroid Function Tests: No results for input(s): TSH, T4TOTAL, FREET4, T3FREE, THYROIDAB in the last 72 hours. Anemia Panel: No results for input(s): VITAMINB12, FOLATE, FERRITIN, TIBC, IRON, RETICCTPCT in the last 72 hours.    Radiology Studies: I have reviewed all of the imaging during this hospital visit personally      Scheduled Meds: . [START ON 09/20/2018] allopurinol  300 mg Oral Daily  . aspirin EC  81 mg Oral Daily  . atorvastatin  80 mg Oral q morning - 10a  . cholecalciferol  5,000 Units Oral Daily  . etoposide  100 mg/m2 (Treatment Plan Recorded) Intravenous Once  . furosemide  40 mg Oral BID  . gabapentin  300 mg Oral QID  . Gerhardt's butt cream  1 application Topical TID  . insulin aspart  0-15 Units Subcutaneous TID WC  . insulin aspart  0-5 Units Subcutaneous QHS  . insulin aspart  5 Units Subcutaneous TID WC  . insulin detemir  32 Units Subcutaneous QHS  . levothyroxine  200 mcg Oral QAC breakfast  . nystatin   Topical BID  . omega-3 acid ethyl esters  1 g Oral Daily  . pantoprazole  40 mg Oral BID  . polyethylene glycol  17 g Oral BID  . senna  2 tablet Oral BID WC  . sodium chloride flush  3 mL Intravenous Q12H  . topiramate  150 mg Oral QPM  . umeclidinium-vilanterol  1 puff Inhalation Daily   Continuous Infusions: . sodium chloride    . lactated ringers 10 mL/hr at 09/14/18 1236  . ondansetron (ZOFRAN) IV 8 mg (09/19/18 1044)  . rasburicase (ELITEK) IV infusion       LOS: 19 days        Mauricio Gerome Apley, MD

## 2018-09-19 NOTE — Progress Notes (Addendum)
PT Cancellation Note  Patient Details Name: Stephanie Combs MRN: [id number] DOB: 1950/09/04   Cancelled Treatment:    Reason Eval/Treat Not Completed: Medical issues which prohibited therapy--pt c/o chest pain this a.m. Nursing in to perform EKG. Pt not willing to work with PT at all today. Will check back another day to work with pt if she is willing to participate.    Weston Anna, PT Acute Rehabilitation Services Pager: [phone] Office: [phone]

## 2018-09-19 NOTE — Progress Notes (Signed)
Stephanie Combs   DOB:1950-05-24   WU#:[id number]   GNF#:[id number]  Subjective:  Patient complains of right-sided chest pain, pain in her teeth, and headache; feels nauseated and like she could vomit, but hasn't had emesis; going to the BR "a lot, because of the lasix;" having normal BMs, no blood or melena; no family in room   Objective: morbidly obese White woman examined in bed Vitals:   09/18/18 2032 09/19/18 0651  BP: 139/62 115/75  Pulse: 72 62  Resp: 18 19  Temp: 98.5 F (36.9 C) 98.2 F (36.8 C)  SpO2: 97% 98%    Body mass index is 51.1 kg/m.  Intake/Output Summary (Last 24 hours) at 09/19/2018 0917 Last data filed at 09/19/2018 0745 Gross per 24 hour  Intake 480 ml  Output 1926 ml  Net -1446 ml     Sclerae unicteric  Oropharynx shows no thrush or other lesions  Lungs no rales or wheezes--auscultated anterolaterally  Heart regular rate and rhythm  Abdomen soft, +BS  Neuro nonfocal, anxious affect  Breast exam: deferred  CBG (last 3)  Recent Labs    09/18/18 2035 09/18/18 2300 09/19/18 0752  GLUCAP 305* 334* 265*     Labs:  Lab Results  Component Value Date   WBC 7.3 09/18/2018   HGB 8.0 (L) 09/18/2018   HCT 29.0 (L) 09/18/2018   MCV 91.2 09/18/2018   PLT 281 09/18/2018   NEUTROABS 4.1 09/18/2018    @LASTCHEMISTRY @  Urine Studies No results for input(s): UHGB, CRYS in the last 72 hours.  Invalid input(s): UACOL, UAPR, USPG, UPH, UTP, UGL, UKET, UBIL, UNIT, UROB, [place], UEPI, UWBC, Duwayne Heck [place], [place]  Basic Metabolic Panel: Recent Labs  Lab 09/13/18 0554 09/14/18 0354 09/15/18 0514 09/17/18 0855 09/18/18 0602  NA 135 136 137 136 135  K 4.4 3.9 4.4 4.0 4.3  CL 99 96* 102 99 97*  CO2 28 29 27 27 28   GLUCOSE 197* 188* 243* 226* 317*  BUN 35* 42* 37* 40* 41*  CREATININE 1.44* 1.43* 1.35* 1.28* 1.34*  CALCIUM 8.4* 8.9 8.8* 8.7* 8.6*   GFR Estimated Creatinine Clearance: 59 mL/min (A) (by C-G formula based on SCr of 1.34 mg/dL  (H)). Liver Function Tests: Recent Labs  Lab 09/18/18 0602  AST 13*  ALT 10  ALKPHOS 97  BILITOT 0.6  PROT 6.1*  ALBUMIN 3.2*   No results for input(s): LIPASE, AMYLASE in the last 168 hours. No results for input(s): AMMONIA in the last 168 hours. Coagulation profile No results for input(s): INR, PROTIME in the last 168 hours.  CBC: Recent Labs  Lab 09/15/18 0514 09/17/18 0855 09/18/18 0602  WBC 7.0 7.5 7.3  NEUTROABS  --   --  4.1  HGB 7.6* 7.5* 8.0*  HCT 25.5* 25.5* 29.0*  MCV 86.1 87.9 91.2  PLT 267 269 281   Cardiac Enzymes: No results for input(s): CKTOTAL, CKMB, CKMBINDEX, TROPONINI in the last 168 hours. BNP: Invalid input(s): POCBNP CBG: Recent Labs  Lab 09/18/18 1128 09/18/18 1617 09/18/18 2035 09/18/18 2300 09/19/18 0752  GLUCAP 157* 196* 305* 334* 265*   D-Dimer No results for input(s): DDIMER in the last 72 hours. Hgb A1c No results for input(s): HGBA1C in the last 72 hours. Lipid Profile No results for input(s): CHOL, HDL, LDLCALC, TRIG, CHOLHDL, LDLDIRECT in the last 72 hours. Thyroid function studies No results for input(s): TSH, T4TOTAL, T3FREE, THYROIDAB in the last 72 hours.  Invalid input(s): FREET3 Anemia work up No results for  input(s): VITAMINB12, FOLATE, FERRITIN, TIBC, IRON, RETICCTPCT in the last 72 hours. Microbiology Recent Results (from the past 240 hour(s))  Fungus Culture With Stain     Status: None (Preliminary result)   Collection Time: 09/14/18  1:37 PM  Result Value Ref Range Status   Fungus Stain Final report  Final    Comment: (NOTE) Performed At: [facility] [place], [name] [id number] Rush Farmer MD CB:[id number]    Fungus (Mycology) Culture PENDING  Incomplete   Fungal Source BRONCHIAL ALVEOLAR LAVAGE  Final    Comment: Performed at [facility], [place] [address] [place], [place] [address]  Culture, respiratory     Status: None   Collection Time: 09/14/18  1:37 PM  Result  Value Ref Range Status   Specimen Description BRONCHIAL ALVEOLAR LAVAGE  Final   Special Requests NONE  Final   Gram Stain   Final    FEW WBC PRESENT,BOTH PMN AND MONONUCLEAR NO ORGANISMS SEEN    Culture   Final    NO GROWTH 2 DAYS Performed at [facility], [place] [address], [place], [place] [address]    Report Status 09/17/2018 FINAL  Final  Acid Fast Smear (AFB)     Status: None   Collection Time: 09/14/18  1:37 PM  Result Value Ref Range Status   AFB Specimen Processing Concentration  Final   Acid Fast Smear Negative  Final    Comment: (NOTE) Performed At: [facility] [place], [name] [id number] Rush Farmer MD OJ:[id number]    Source (AFB) BRONCHIAL ALVEOLAR LAVAGE  Final    Comment: Performed at [facility], [place] [address] [place], [place] [address]  Fungus Culture Result     Status: None   Collection Time: 09/14/18  1:37 PM  Result Value Ref Range Status   Result 1 Comment  Final    Comment: (NOTE) KOH/Calcofluor preparation:  no fungus observed. Performed At: [facility] [place], [name] [id number] Rush Farmer MD FB:[id number]       Studies:  Dg Chest 2 View  Result Date: 09/17/2018 CLINICAL DATA:  Recent onset of cough. EXAM: CHEST - 2 VIEW COMPARISON:  Chest CT from 6 days ago FINDINGS: Left hilar mass with left upper lobe collapse, new. Normal heart size when accounting for rotation and low volumes. IMPRESSION: Collapse of the left upper lobe in this patient with known hilar mass. Electronically Signed   By: Monte Fantasia M.D.   On: 09/17/2018 11:18     Assessment: 68 y.o. [place], [place] woman with synchronous colon and lung cancers, currently admitted for cycle 1 chemotherapy, currently day 2 cycle 1  (1) small cell lung cancer, limited stage (?)  (a) LUL lung biopsy 09/14/2018 shows small cell lung cancer  (b) CT scans 09/11/2018 show 5.4 cm LUL mass, prevascular and subcarinal adenopathy,  trace left effusion  (c) brain MRI pending  (d) carboplatin/ etoposide started 09/18/2018  (2) colon cancer:  (a) CT scan 05/02 shows 4.0 cm ascending colon lesion  (b) biopsies 09/09/2018 confirm adenocarcinoma  (3) DNR in place  (4) diabetes mellitus   Plan:  She tolerated day 1 chemo moderateky well. This AM she is c/o nausea. She received decadron for nausea as premed. I have added ondansetron TID and compazine PRN.  Dexamethasone is complicating glucose management. This is being closely followed.  She c/o right-sided chest pain. She takes narcotics for her chronic pain (rheumatoid arthitis per patient).  She also takes NTG at home. Will add low-dose morphine in case oral agents not effective.  Patient is understandably under a great deal of stress. We discussed her 2 separate cancers. She does have alprazolam available and that seems to help.  Labs this AM pending. Anticipate proceeding to chemo as written. Allopurinol added yesterday. She will receive Udenyca on day 5.   Chauncey Cruel, MD 09/19/2018  9:17 AM Medical Oncology and Hematology [facility] [address] [place], [place] [address] Tel. [phone]    Fax. [phone]

## 2018-09-20 ENCOUNTER — Inpatient Hospital Stay (HOSPITAL_COMMUNITY): Payer: Medicare Other

## 2018-09-20 DIAGNOSIS — K635 Polyp of colon: Secondary | ICD-10-CM

## 2018-09-20 DIAGNOSIS — N183 Chronic kidney disease, stage 3 (moderate): Secondary | ICD-10-CM

## 2018-09-20 LAB — COMPREHENSIVE METABOLIC PANEL
ALT: 10 U/L (ref 0–44)
AST: 12 U/L — ABNORMAL LOW (ref 15–41)
Albumin: 3 g/dL — ABNORMAL LOW (ref 3.5–5.0)
Alkaline Phosphatase: 82 U/L (ref 38–126)
Anion gap: 8 (ref 5–15)
BUN: 53 mg/dL — ABNORMAL HIGH (ref 8–23)
CO2: 27 mmol/L (ref 22–32)
Calcium: 8.5 mg/dL — ABNORMAL LOW (ref 8.9–10.3)
Chloride: 101 mmol/L (ref 98–111)
Creatinine, Ser: 1.52 mg/dL — ABNORMAL HIGH (ref 0.44–1.00)
GFR calc Af Amer: 40 mL/min — ABNORMAL LOW (ref >60)
GFR calc non Af Amer: 35 mL/min — ABNORMAL LOW (ref >60)
Glucose, Bld: 227 mg/dL — ABNORMAL HIGH (ref 70–99)
Potassium: 4.2 mmol/L (ref 3.5–5.1)
Sodium: 136 mmol/L (ref 135–145)
Total Bilirubin: 0.3 mg/dL (ref 0.3–1.2)
Total Protein: 5.6 g/dL — ABNORMAL LOW (ref 6.5–8.1)

## 2018-09-20 LAB — GLUCOSE, CAPILLARY
Glucose-Capillary: 215 mg/dL — ABNORMAL HIGH (ref 70–99)
Glucose-Capillary: 205 mg/dL — ABNORMAL HIGH (ref 70–99)
Glucose-Capillary: 226 mg/dL — ABNORMAL HIGH (ref 70–99)
Glucose-Capillary: 233 mg/dL — ABNORMAL HIGH (ref 70–99)

## 2018-09-20 LAB — MAGNESIUM: Magnesium: 2.2 mg/dL (ref 1.7–2.4)

## 2018-09-20 LAB — URIC ACID: Uric Acid, Serum: 1.3 mg/dL — ABNORMAL LOW (ref 2.5–7.1)

## 2018-09-20 LAB — PHOSPHORUS: Phosphorus: 5 mg/dL — ABNORMAL HIGH (ref 2.5–4.6)

## 2018-09-20 MED ORDER — SODIUM CHLORIDE 0.9 % IV SOLN
100.0000 mg/m2 | Freq: Once | INTRAVENOUS | Status: AC
  Administered 2018-09-20: 260 mg via INTRAVENOUS
  Filled 2018-09-20: qty 13

## 2018-09-20 MED ORDER — INSULIN DETEMIR 100 UNIT/ML ~~LOC~~ SOLN
36.0000 [IU] | Freq: Every day | SUBCUTANEOUS | Status: DC
  Administered 2018-09-20 – 2018-09-21 (×2): 36 [IU] via SUBCUTANEOUS
  Filled 2018-09-20 (×3): qty 0.36

## 2018-09-20 MED ORDER — INSULIN ASPART 100 UNIT/ML ~~LOC~~ SOLN
8.0000 [IU] | Freq: Three times a day (TID) | SUBCUTANEOUS | Status: DC
  Administered 2018-09-20 – 2018-09-22 (×5): 8 [IU] via SUBCUTANEOUS

## 2018-09-20 MED ORDER — GUAIFENESIN-DM 100-10 MG/5ML PO SYRP
5.0000 mL | ORAL_SOLUTION | ORAL | Status: DC | PRN
  Administered 2018-09-20 – 2018-09-22 (×5): 5 mL via ORAL
  Filled 2018-09-20 (×5): qty 10

## 2018-09-20 MED ORDER — GADOBUTROL 1 MMOL/ML IV SOLN
10.0000 mL | Freq: Once | INTRAVENOUS | Status: AC | PRN
  Administered 2018-09-20: 18:00:00 10 mL via INTRAVENOUS

## 2018-09-20 MED ORDER — SODIUM CHLORIDE 0.9 % IV SOLN
10.0000 mg | Freq: Once | INTRAVENOUS | Status: AC
  Administered 2018-09-20: 11:00:00 10 mg via INTRAVENOUS
  Filled 2018-09-20: qty 1

## 2018-09-20 NOTE — Progress Notes (Signed)
Pt c/o chest pain. EKG obtained. MD on floor and made aware.

## 2018-09-20 NOTE — Progress Notes (Signed)
Inpatient Diabetes Program Recommendations  AACE/ADA: New Consensus Statement on Inpatient Glycemic Control (2015)  Target Ranges:  Prepandial:   less than 140 mg/dL      Peak postprandial:   less than 180 mg/dL (1-2 hours)      Critically ill patients:  140 - 180 mg/dL   Lab Results  Component Value Date   GLUCAP 215 (H) 09/20/2018   HGBA1C 7.5 (H) 08/30/2018    Review of Glycemic Control  CBGs over past 24H: 215-357 mg/dL. Needs insulin adjustment.  Inpatient Diabetes Program Recommendations:  Increase Levemir to 36 units QHS Increase Novolog to 8 units tidwc for meal coverage insulin.  Will continue to follow closely.  Thank you. Lorenda Peck, RD, LDN, CDE Inpatient Diabetes Coordinator [phone]

## 2018-09-20 NOTE — Progress Notes (Addendum)
PROGRESS NOTE    Stephanie Combs  LOV:[id number] DOB: 01-17-1951 DOA: 08/30/2018 PCP: [name], MD    Brief Narrative:  68 year old female who presented with dyspnea and lower extremity edema. Does have significant past medical history for coronary artery disease, diastolic heart failure, type 2 diabetes mellitus, chronic kidney disease stage III, hypertension, hypothyroidism and chronic lymphedema. She reported 5 days of worsening dyspnea along with lower extremity edema and dry cough. Symptoms were refractory to increased dose of oral furosemide. On her initial physical examination she was afebrile, pulse rate 84, respirate 24, blood pressure 130/100, oxygenation 100% on supplemental oxygen. Her lungs are clear to auscultation bilaterally, heart S1-S2 present, rhythmic, no gallops, rubs or murmurs, the abdomen was protuberant, nontender, no lower extremity edema. Sodium 138, potassium 4.2, chloride 102, bicarb 27, glucose 241, BUN 23, creatinine 1.58, AST 13, ALT 11, iron 9, TIBC 431, transferrin saturation 2, ferritin 4, white count 7.7, hemoglobin 7.0, hematocrit 35.0, platelets 377.  Patient was admitted to the hospital with a working diagnosis of symptomatic anemia and decompensated heart failure.  Patient received 2 units packed red blood cells with improvement of her hemoglobin and hematocrit.  Patient was successfully diuresed with IV furosemide, with improvement of her symptoms.  Further work-up with colonoscopy April 30 showed a colonic mass. [facility] to [facility] for surgical evaluation.  Patient was found to have a large left hilar mass, consulted pulmonary and patient underwent bronchoscopy today plus biopsy.  Diagnosed with synchronous lung and colon cancer, transfer to [facility] for chemotherapy and brain MRI.   Assessment & Plan:   Principal Problem:   Anemia Active Problems:   DM type 2 (diabetes mellitus, type 2) (HCC)   HTN  (hypertension), benign   CKD (chronic kidney disease), stage III (HCC)   Hypothyroidism   Bilateral lower extremity edema   Uncontrolled type 2 diabetes mellitus with stage 3 chronic kidney disease (HCC)   Obesity, Class III, BMI 40-49.9 (morbid obesity) (HCC)   CAD (coronary artery disease)   Symptomatic anemia   Acute on chronic diastolic CHF (congestive heart failure) (HCC)   Heme positive stool   Non-intractable vomiting   Abdominal pain, epigastric   Pain of lower extremity   Colonic mass   Colon polyps   Cellulitis of leg, left   Malignant neoplasm of hepatic flexure (HCC)   Small cell lung cancer, left upper lobe (Stephanie Combs)   1. 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 On day 3 of 1 cycle of chemotherapy, tolerating well, no nausea or vomiting, no dyspnea. Pending brain MRI, will continue to follow with oncology recommendations. Her uric acid is down to 1,3. Personally reviewed EKG with no active ischemic changes.   2. Symptomatic iron deficiency anemia due to coloniccancer/ adenocarcinoma.Severe iron deficiency(serum iron at 9.0, TIBC at 413, ferritin 4 and transferrin saturation of 2). Patient has received 2 units prbc transfusion and IV iron infusionx1.Follow cell count in am, patient is not candidate for surgical intervention, she is tolerating po well, no nausea or vomiting.  3. Decompensated diastolic heart failure, acute on chronic.EF 60 to 65% with moderately increased LV wall thickness.Continue with furosemide 40 mg po bid, clinically has persistent edema.    3.Left leg cellulitis.clinically resolved.  4. Morbid obesity and dyslipidemia.BMI is50.5, in risk for medical complications, will need follow up as outpatient.   5. T2DM.Uncontrolled hyperglycemia, will increase basal lnsulin levimirto 36 units daily plus8 units  pre-meal, and sliding scale insulin, for glucose  cover and monitoring.Tolerating po well.   6. Hypothyroid.Continue withlevothyroxine.   7. AKI on CKD stage 3. Renal function with serum cr at 1,52 with K at 4,2 and serum bicarbonate at 27. Persistent hypervolemia, challenging physical volume assessment due to large BMI, will continue furosemide 40 mg bid for now. Follow on renal panel in am.   DVT prophylaxis:heparin Code Status:full Family Communication:no family at the bedside Disposition Plan/ discharge barriers:plan to discharge after completing cycle 1 of chemotherapy and brain MRI.   Body mass index is 51.1 kg/m. Malnutrition Type:      Malnutrition Characteristics:      Nutrition Interventions:     RN Pressure Injury Documentation:     Consultants:   Oncology   GI   Pulmonary  Palliative Care  Procedures:   Colonoscopy with biopsy   Bronchoscopy with biopsy  Antimicrobials:      Subjective: Patient had intermittent chest pain and nausea, no dyspnea, no diarrhea. Tolerating po well.   Objective: Vitals:   09/19/18 0932 09/19/18 1339 09/19/18 2130 09/20/18 0707  BP: (!) 132/58 (!) 114/52 (!) 134/50 109/66  Pulse: 77 73 68 61  Resp: 18 (!) 21 20 19   Temp: (!) 97.3 F (36.3 C) 98.7 F (37.1 C) 98.4 F (36.9 C) 97.9 F (36.6 C)  TempSrc:  Oral Oral Oral  SpO2: 97% 99% 94% 100%  Weight:      Height:        Intake/Output Summary (Last 24 hours) at 09/20/2018 0901 Last data filed at 09/20/2018 0753 Gross per 24 hour  Intake 953.76 ml  Output 1250 ml  Net -296.24 ml   Filed Weights   09/17/18 0608 09/18/18 0406 09/19/18 0651  Weight: (!) 139.1 kg (!) 142 kg (!) 143.6 kg    Examination:   General: Not in pain or dyspnea, deconditioned  Neurology: Awake and alert, non focal  E ENT: no pallor, no icterus, oral mucosa moist Cardiovascular: No JVD. S1-S2 present, rhythmic, no gallops, rubs, or murmurs. +++ non pitting lower extremity edema. Pulmonary: positive breath  sounds bilaterally, no wheezing, rhonchi or rales. Gastrointestinal. Abdomen with no organomegaly, non tender, no rebound or guarding Skin. No rashes Musculoskeletal: no joint deformities     Data Reviewed: I have personally reviewed following labs and imaging studies  CBC: Recent Labs  Lab 09/15/18 0514 09/17/18 0855 09/18/18 0602  WBC 7.0 7.5 7.3  NEUTROABS  --   --  4.1  HGB 7.6* 7.5* 8.0*  HCT 25.5* 25.5* 29.0*  MCV 86.1 87.9 91.2  PLT 267 269 914   Basic Metabolic Panel: Recent Labs  Lab 09/15/18 0514 09/17/18 0855 09/18/18 0602 09/19/18 0900 09/20/18 0531  NA 137 136 135 135 136  K 4.4 4.0 4.3 4.3 4.2  CL 102 99 97* 97* 101  CO2 27 27 28 28 27   GLUCOSE 243* 226* 317* 292* 227*  BUN 37* 40* 41* 46* 53*  CREATININE 1.35* 1.28* 1.34* 1.28* 1.52*  CALCIUM 8.8* 8.7* 8.6* 8.6* 8.5*  MG  --   --   --   --  2.2  PHOS  --   --   --  4.7* 5.0*   GFR: Estimated Creatinine Clearance: 52 mL/min (A) (by C-G formula based on SCr of 1.52 mg/dL (H)). Liver Function Tests: Recent Labs  Lab 09/18/18 0602 09/19/18 0900 09/20/18 0531  AST 13* <5* 12*  ALT 10 10 10   ALKPHOS 97 91 82  BILITOT  0.6 0.6 0.3  PROT 6.1* 5.9* 5.6*  ALBUMIN 3.2* 3.0* 3.0*   No results for input(s): LIPASE, AMYLASE in the last 168 hours. No results for input(s): AMMONIA in the last 168 hours. Coagulation Profile: No results for input(s): INR, PROTIME in the last 168 hours. Cardiac Enzymes: No results for input(s): CKTOTAL, CKMB, CKMBINDEX, TROPONINI in the last 168 hours. BNP (last 3 results) No results for input(s): PROBNP in the last 8760 hours. HbA1C: No results for input(s): HGBA1C in the last 72 hours. CBG: Recent Labs  Lab 09/19/18 0752 09/19/18 1228 09/19/18 1535 09/19/18 2135 09/20/18 0740  GLUCAP 265* 280* 258* 357* 215*   Lipid Profile: No results for input(s): CHOL, HDL, LDLCALC, TRIG, CHOLHDL, LDLDIRECT in the last 72 hours. Thyroid Function Tests: No results for  input(s): TSH, T4TOTAL, FREET4, T3FREE, THYROIDAB in the last 72 hours. Anemia Panel: No results for input(s): VITAMINB12, FOLATE, FERRITIN, TIBC, IRON, RETICCTPCT in the last 72 hours.    Radiology Studies: I have reviewed all of the imaging during this hospital visit personally     Scheduled Meds: . allopurinol  300 mg Oral Daily  . aspirin EC  81 mg Oral Daily  . atorvastatin  80 mg Oral q morning - 10a  . cholecalciferol  5,000 Units Oral Daily  . etoposide  100 mg/m2 (Treatment Plan Recorded) Intravenous Once  . furosemide  40 mg Oral BID  . gabapentin  300 mg Oral QID  . Gerhardt's butt cream  1 application Topical TID  . insulin aspart  0-15 Units Subcutaneous TID WC  . insulin aspart  0-5 Units Subcutaneous QHS  . insulin aspart  5 Units Subcutaneous TID WC  . insulin detemir  32 Units Subcutaneous QHS  . levothyroxine  200 mcg Oral QAC breakfast  . nystatin   Topical BID  . omega-3 acid ethyl esters  1 g Oral Daily  . pantoprazole  40 mg Oral BID  . polyethylene glycol  17 g Oral BID  . senna  2 tablet Oral BID WC  . sodium chloride flush  3 mL Intravenous Q12H  . topiramate  150 mg Oral QPM  . umeclidinium-vilanterol  1 puff Inhalation Daily   Continuous Infusions: . sodium chloride    . dexamethasone (DECADRON) IVPB ([facility])    . lactated ringers 10 mL/hr at 09/14/18 1236  . ondansetron (ZOFRAN) IV 8 mg (09/20/18 0147)     LOS: 20 days        Kenyata Guess Gerome Apley, MD

## 2018-09-20 NOTE — Progress Notes (Addendum)
HEMATOLOGY-ONCOLOGY PROGRESS NOTE  SUBJECTIVE: The patient reports chest pain in the center of her chest this morning.  States that she also has jaw pain and a splitting headache.  She had the same symptoms yesterday.  Reports mild nausea but no vomiting.  Reports ongoing dyspnea on exertion and cough.  She has no other complaints this morning.  REVIEW OF SYSTEMS:   Constitutional: Reports fatigue.  Denies fevers and chills. Eyes: Denies blurriness of vision Ears, nose, mouth, throat, and face: Denies mucositis or sore throat Respiratory: Reports cough and congestion. Cardiovascular: Denies palpitation, reports chest pain. Gastrointestinal: Reports mild nausea but no vomiting.  Denies change in bowel habits. Skin: Denies abnormal skin rashes Lymphatics: Denies new lymphadenopathy or easy bruising Neurological:Denies numbness, tingling or new weaknesses Behavioral/Psych: Mood is stable, no new changes  Extremities: Has ongoing lower extremity edema. All other systems were reviewed with the patient and are negative.  I have reviewed the past medical history, past surgical history, social history and family history with the patient and they are unchanged from previous note.   PHYSICAL EXAMINATION:  Vitals:   09/19/18 2130 09/20/18 0707  BP: (!) 134/50 109/66  Pulse: 68 61  Resp: 20 19  Temp: 98.4 F (36.9 C) 97.9 F (36.6 C)  SpO2: 94% 100%   Filed Weights   09/17/18 0608 09/18/18 0406 09/19/18 0651  Weight: (!) 306 lb 9.6 oz (139.1 kg) (!) 313 lb 0.9 oz (142 kg) (!) 316 lb 9.3 oz (143.6 kg)    Intake/Output from previous day: 05/10 0701 - 05/11 0700 In: 953.8 [IV Piggyback:953.8] Out: 1025 [Urine:1025]  GENERAL:alert, no distress and comfortable SKIN: skin color, texture, turgor are normal, no rashes or significant lesions EYES: normal, Conjunctiva are pink and non-injected, sclera clear OROPHARYNX:no exudate, no erythema and lips, buccal mucosa, and tongue normal  NECK:  supple, thyroid normal size, non-tender, without nodularity LYMPH:  no palpable lymphadenopathy in the cervical, axillary or inguinal LUNGS: Normal breathing effort.  Expiratory wheezes noted. HEART: regular rate & rhythm and no murmurs.  2+ lower extremity edema. ABDOMEN:abdomen soft, non-tender and normal bowel sounds Musculoskeletal:no cyanosis of digits and no clubbing  NEURO: alert & oriented x 3 with fluent speech, no focal motor/sensory deficits  LABORATORY DATA:  I have reviewed the data as listed CMP Latest Ref Rng & Units 09/20/2018 09/19/2018 09/18/2018  Glucose 70 - 99 mg/dL 227(H) 292(H) 317(H)  BUN 8 - 23 mg/dL 53(H) 46(H) 41(H)  Creatinine 0.44 - 1.00 mg/dL 1.52(H) 1.28(H) 1.34(H)  Sodium 135 - 145 mmol/L 136 135 135  Potassium 3.5 - 5.1 mmol/L 4.2 4.3 4.3  Chloride 98 - 111 mmol/L 101 97(L) 97(L)  CO2 22 - 32 mmol/L 27 28 28   Calcium 8.9 - 10.3 mg/dL 8.5(L) 8.6(L) 8.6(L)  Total Protein 6.5 - 8.1 g/dL 5.6(L) 5.9(L) 6.1(L)  Total Bilirubin 0.3 - 1.2 mg/dL 0.3 0.6 0.6  Alkaline Phos 38 - 126 U/L 82 91 97  AST 15 - 41 U/L 12(L) <5(L) 13(L)  ALT 0 - 44 U/L 10 10 10     Lab Results  Component Value Date   WBC 7.3 09/18/2018   HGB 8.0 (L) 09/18/2018   HCT 29.0 (L) 09/18/2018   MCV 91.2 09/18/2018   PLT 281 09/18/2018   NEUTROABS 4.1 09/18/2018    Dg Chest 2 View  Result Date: 09/17/2018 CLINICAL DATA:  Recent onset of cough. EXAM: CHEST - 2 VIEW COMPARISON:  Chest CT from 6 days ago FINDINGS: Left hilar mass  with left upper lobe collapse, new. Normal heart size when accounting for rotation and low volumes. IMPRESSION: Collapse of the left upper lobe in this patient with known hilar mass. Electronically Signed   By: Monte Fantasia M.D.   On: 09/17/2018 11:18   Ct Chest W Contrast  Result Date: 09/11/2018 CLINICAL DATA:  68 year old female inpatient admitted with symptomatic anemia and decompensated heart failure, found to have hepatic flexure colon mass on colonoscopy  09/09/2018. staging evaluation. EXAM: CT CHEST, ABDOMEN, AND PELVIS WITH CONTRAST TECHNIQUE: Multidetector CT imaging of the chest, abdomen and pelvis was performed following the standard protocol during bolus administration of intravenous contrast. CONTRAST:  121mL OMNIPAQUE IOHEXOL 300 MG/ML  SOLN COMPARISON:  08/30/2018 chest radiograph. 01/27/2017 chest CT angiogram. 04/05/2018 CT abdomen/pelvis. FINDINGS: CT CHEST FINDINGS Cardiovascular: Borderline mild cardiomegaly. No significant pericardial effusion/thickening. Left anterior descending coronary atherosclerosis. Atherosclerotic nonaneurysmal thoracic aorta. Dilated main pulmonary artery (3.5 cm diameter). No central pulmonary emboli. Mediastinum/Nodes: No discrete thyroid nodules. Unremarkable esophagus. No axillary adenopathy. Multiple enlarged left prevascular/AP window lymph nodes, largest 1.6 cm (series 3/image 18), new since 01/27/2017 chest CT. New enlarged 2.1 cm subcarinal node (series 3/image 23). No right hilar adenopathy. Lungs/Pleura: No pneumothorax. Trace dependent left pleural effusion. No right pleural effusion. Central perihilar left upper lobe 5.4 x 4.2 cm lung mass (series 3/image 27), new since 01/27/2017 chest CT, with associated high-grade stenosis of the left upper lobe bronchus. Postobstructive consolidation in lingula with some associated volume loss. No significant pulmonary nodules. Mild passive atelectasis in dependent lower lobes. Musculoskeletal: No aggressive appearing focal osseous lesions. Mild thoracic spondylosis. CT ABDOMEN PELVIS FINDINGS Hepatobiliary: Normal liver size. No liver mass. Normal gallbladder with no radiopaque cholelithiasis. No biliary ductal dilatation. Pancreas: Normal, with no mass or duct dilation. Spleen: Normal size. No mass. Adrenals/Urinary Tract: Left adrenal 2.4 cm nodule with density 14 HU, stable in size since 04/30/2015 CT, compatible with a benign adenoma. Stable appearance of the right  adrenal gland without discrete right adrenal nodule. Punctate nonobstructing interpolar right and upper left renal stones. No hydronephrosis. Subcentimeter hypodense renal cortical lesion in the lower left kidney is too small to characterize and is unchanged, considered benign. No additional renal lesions. Normal bladder. Stomach/Bowel: Small hiatal hernia. Otherwise normal nondistended stomach. Normal caliber small bowel with no small bowel wall thickening. Appendix not discretely visualized. No pericecal inflammatory changes. Oral contrast transits to the right colon. There is an apple-core lesion in the ascending colon measuring 4.0 x 3.9 cm (series 5/image 60) with irregular annular wall thickening. No additional sites of large bowel wall thickening. No significant colonic diverticulosis. No acute pericolonic fat stranding. Moderate diffuse colonic stool volume. Vascular/Lymphatic: Atherosclerotic nonaneurysmal abdominal aorta. Patent portal, splenic, hepatic and renal veins. Top-normal size 0.6 cm right mesenteric node (series 3/image 73). No pathologically enlarged lymph nodes in the abdomen or pelvis. Reproductive: Grossly normal uterus.  No adnexal mass. Other: No pneumoperitoneum, ascites or focal fluid collection. Musculoskeletal: No aggressive appearing focal osseous lesions. Marked lumbar spondylosis. IMPRESSION: 1. Central perihilar left upper lobe 5.4 cm lung mass, new since 01/27/2017 chest CT, compatible with malignancy, favor primary bronchogenic carcinoma. Postobstructive pneumonia/atelectasis in the lingula. 2. Mediastinal adenopathy in the left prevascular/AP window and subcarinal stations, compatible with metastatic nodes (more likely originating from the left upper lobe lung mass). 3. Trace dependent left pleural effusion. 4. Apple-core lesion in the ascending colon, compatible with known primary colonic neoplasm. Adjacent top-normal size right mesenteric lymph node is equivocal for  locoregional nodal metastasis. No additional potential findings of metastatic disease in the abdomen or pelvis. 5. Chronic findings include: Aortic Atherosclerosis (ICD10-I70.0). Borderline mild cardiomegaly. One vessel coronary atherosclerosis. Dilated main pulmonary artery, suggesting pulmonary arterial hypertension. Left adrenal adenoma. Punctate bilateral nephrolithiasis. Small hiatal hernia. Electronically Signed   By: Ilona Sorrel M.D.   On: 09/11/2018 16:42   Ct Abdomen Pelvis W Contrast  Result Date: 09/11/2018 CLINICAL DATA:  68 year old female inpatient admitted with symptomatic anemia and decompensated heart failure, found to have hepatic flexure colon mass on colonoscopy 09/09/2018. staging evaluation. EXAM: CT CHEST, ABDOMEN, AND PELVIS WITH CONTRAST TECHNIQUE: Multidetector CT imaging of the chest, abdomen and pelvis was performed following the standard protocol during bolus administration of intravenous contrast. CONTRAST:  110mL OMNIPAQUE IOHEXOL 300 MG/ML  SOLN COMPARISON:  08/30/2018 chest radiograph. 01/27/2017 chest CT angiogram. 04/05/2018 CT abdomen/pelvis. FINDINGS: CT CHEST FINDINGS Cardiovascular: Borderline mild cardiomegaly. No significant pericardial effusion/thickening. Left anterior descending coronary atherosclerosis. Atherosclerotic nonaneurysmal thoracic aorta. Dilated main pulmonary artery (3.5 cm diameter). No central pulmonary emboli. Mediastinum/Nodes: No discrete thyroid nodules. Unremarkable esophagus. No axillary adenopathy. Multiple enlarged left prevascular/AP window lymph nodes, largest 1.6 cm (series 3/image 18), new since 01/27/2017 chest CT. New enlarged 2.1 cm subcarinal node (series 3/image 23). No right hilar adenopathy. Lungs/Pleura: No pneumothorax. Trace dependent left pleural effusion. No right pleural effusion. Central perihilar left upper lobe 5.4 x 4.2 cm lung mass (series 3/image 27), new since 01/27/2017 chest CT, with associated high-grade stenosis of the  left upper lobe bronchus. Postobstructive consolidation in lingula with some associated volume loss. No significant pulmonary nodules. Mild passive atelectasis in dependent lower lobes. Musculoskeletal: No aggressive appearing focal osseous lesions. Mild thoracic spondylosis. CT ABDOMEN PELVIS FINDINGS Hepatobiliary: Normal liver size. No liver mass. Normal gallbladder with no radiopaque cholelithiasis. No biliary ductal dilatation. Pancreas: Normal, with no mass or duct dilation. Spleen: Normal size. No mass. Adrenals/Urinary Tract: Left adrenal 2.4 cm nodule with density 14 HU, stable in size since 04/30/2015 CT, compatible with a benign adenoma. Stable appearance of the right adrenal gland without discrete right adrenal nodule. Punctate nonobstructing interpolar right and upper left renal stones. No hydronephrosis. Subcentimeter hypodense renal cortical lesion in the lower left kidney is too small to characterize and is unchanged, considered benign. No additional renal lesions. Normal bladder. Stomach/Bowel: Small hiatal hernia. Otherwise normal nondistended stomach. Normal caliber small bowel with no small bowel wall thickening. Appendix not discretely visualized. No pericecal inflammatory changes. Oral contrast transits to the right colon. There is an apple-core lesion in the ascending colon measuring 4.0 x 3.9 cm (series 5/image 60) with irregular annular wall thickening. No additional sites of large bowel wall thickening. No significant colonic diverticulosis. No acute pericolonic fat stranding. Moderate diffuse colonic stool volume. Vascular/Lymphatic: Atherosclerotic nonaneurysmal abdominal aorta. Patent portal, splenic, hepatic and renal veins. Top-normal size 0.6 cm right mesenteric node (series 3/image 73). No pathologically enlarged lymph nodes in the abdomen or pelvis. Reproductive: Grossly normal uterus.  No adnexal mass. Other: No pneumoperitoneum, ascites or focal fluid collection. Musculoskeletal:  No aggressive appearing focal osseous lesions. Marked lumbar spondylosis. IMPRESSION: 1. Central perihilar left upper lobe 5.4 cm lung mass, new since 01/27/2017 chest CT, compatible with malignancy, favor primary bronchogenic carcinoma. Postobstructive pneumonia/atelectasis in the lingula. 2. Mediastinal adenopathy in the left prevascular/AP window and subcarinal stations, compatible with metastatic nodes (more likely originating from the left upper lobe lung mass). 3. Trace dependent left pleural effusion. 4. Apple-core lesion in the ascending colon,  compatible with known primary colonic neoplasm. Adjacent top-normal size right mesenteric lymph node is equivocal for locoregional nodal metastasis. No additional potential findings of metastatic disease in the abdomen or pelvis. 5. Chronic findings include: Aortic Atherosclerosis (ICD10-I70.0). Borderline mild cardiomegaly. One vessel coronary atherosclerosis. Dilated main pulmonary artery, suggesting pulmonary arterial hypertension. Left adrenal adenoma. Punctate bilateral nephrolithiasis. Small hiatal hernia. Electronically Signed   By: Ilona Sorrel M.D.   On: 09/11/2018 16:42   US Venous Img Lower Bilateral  Result Date: 09/08/2018 CLINICAL DATA:  Lower extremity pain EXAM: BILATERAL LOWER EXTREMITY VENOUS DUPLEX ULTRASOUND TECHNIQUE: Doppler venous assessment of the bilateral lower extremity deep venous system was performed, including characterization of spectral flow, compressibility, and phasicity. COMPARISON:  None. FINDINGS: There is complete compressibility of the bilateral common femoral, femoral, and popliteal veins. Doppler analysis demonstrates respiratory phasicity and augmentation of flow with calf compression. No obvious superficial vein or calf vein thrombosis. IMPRESSION: No evidence of lower extremity DVT. Electronically Signed   By: Marybelle Killings M.D.   On: 09/08/2018 13:27   Dg Chest Portable 1 View  Result Date: 08/30/2018 CLINICAL DATA:   Short of breath EXAM: PORTABLE CHEST 1 VIEW COMPARISON:  07/05/2018 FINDINGS: Mild cardiac enlargement. Negative for heart failure or edema. Mild left lower lobe airspace disease and small left effusion. Right lung base clear. IMPRESSION: Mild left lower lobe atelectasis/infiltrate and small left effusion. Mild progression since the prior study. Electronically Signed   By: Franchot Gallo M.D.   On: 08/30/2018 21:33    ASSESSMENT AND PLAN: 1.  Colorectal adenocarcinoma, non-metastatic  2.  Small cell lung cancer, limited stage (pending brian MRI) 3.  Iron deficiency anemia 4.  Coronary artery disease 5.  Congestive heart failure 6.  Diabetes mellitus 7.  Hypertension 8.  Hypothyroidism  -Labs reviewed and are adequate for treatment.  Proceed with day 3 of cycle 1 of her treatment today.  No CBC was drawn this morning.  Will check tomorrow morning. -Continue gentle hydration. -EKG now due to chest pain. -MRI of the brain with and without contrast for initial staging. -We will plan for outpatient follow-up at [facility]  Mikey Bussing, DNP, AGPCNP-BC, AOCNP 09/20/18   Addendum  I have seen the patient, examined her. I agree with the assessment and and plan and have edited the notes.   Stephanie Combs is tolerating chemo well, with intermittent nausea, no vomiting.  Lab reviewed, creatinine slightly higher than before, before treatment, will proceed last day of etoposide today.  Dose discussed with pharmacy.  We discussed the role of radiation in her small cell lung cancer treatment, she will think about where she wants get radiation and let me know tomorrow.   If Cr stable tomorrow morning, OK to stop IVF.   Will continue f/u. OK to discharge from oncology standpoint, if she is otherwise medically stable.  Truitt Merle  09/20/2018

## 2018-09-21 ENCOUNTER — Other Ambulatory Visit: Payer: Self-pay | Admitting: Hematology

## 2018-09-21 ENCOUNTER — Ambulatory Visit (HOSPITAL_COMMUNITY): Payer: Medicare Other | Admitting: Hematology

## 2018-09-21 ENCOUNTER — Ambulatory Visit
Admit: 2018-09-21 | Discharge: 2018-09-21 | Disposition: A | Discharge status: Home / Self Care | Payer: Medicare Other | Source: Ambulatory Visit | Attending: Radiation Oncology | Admitting: Radiation Oncology

## 2018-09-21 DIAGNOSIS — D509 Iron deficiency anemia, unspecified: Secondary | ICD-10-CM | POA: Insufficient documentation

## 2018-09-21 DIAGNOSIS — Z748 Other problems related to care provider dependency: Secondary | ICD-10-CM

## 2018-09-21 DIAGNOSIS — E039 Hypothyroidism, unspecified: Secondary | ICD-10-CM | POA: Insufficient documentation

## 2018-09-21 DIAGNOSIS — I509 Heart failure, unspecified: Secondary | ICD-10-CM | POA: Insufficient documentation

## 2018-09-21 DIAGNOSIS — E119 Type 2 diabetes mellitus without complications: Secondary | ICD-10-CM | POA: Insufficient documentation

## 2018-09-21 DIAGNOSIS — I5032 Chronic diastolic (congestive) heart failure: Secondary | ICD-10-CM

## 2018-09-21 DIAGNOSIS — C183 Malignant neoplasm of hepatic flexure: Secondary | ICD-10-CM

## 2018-09-21 DIAGNOSIS — C19 Malignant neoplasm of rectosigmoid junction: Secondary | ICD-10-CM | POA: Insufficient documentation

## 2018-09-21 DIAGNOSIS — I2510 Atherosclerotic heart disease of native coronary artery without angina pectoris: Secondary | ICD-10-CM | POA: Insufficient documentation

## 2018-09-21 DIAGNOSIS — I110 Hypertensive heart disease with heart failure: Secondary | ICD-10-CM | POA: Insufficient documentation

## 2018-09-21 DIAGNOSIS — C3490 Malignant neoplasm of unspecified part of unspecified bronchus or lung: Secondary | ICD-10-CM | POA: Insufficient documentation

## 2018-09-21 DIAGNOSIS — C3412 Malignant neoplasm of upper lobe, left bronchus or lung: Secondary | ICD-10-CM

## 2018-09-21 DIAGNOSIS — Z510 Encounter for antineoplastic radiation therapy: Secondary | ICD-10-CM | POA: Insufficient documentation

## 2018-09-21 LAB — CBC WITH DIFFERENTIAL/PLATELET
Abs Immature Granulocytes: 0.03 10*3/uL (ref 0.00–0.07)
Basophils Absolute: 0 10*3/uL (ref 0.0–0.1)
Basophils Relative: 1 %
Eosinophils Absolute: 0 10*3/uL (ref 0.0–0.5)
Eosinophils Relative: 0 %
HCT: 24.2 % — ABNORMAL LOW (ref 36.0–46.0)
Hemoglobin: 7.1 g/dL — ABNORMAL LOW (ref 12.0–15.0)
Immature Granulocytes: 1 %
Lymphocytes Relative: 17 %
Lymphs Abs: 1 10*3/uL (ref 0.7–4.0)
MCH: 26.6 pg (ref 26.0–34.0)
MCHC: 29.3 g/dL — ABNORMAL LOW (ref 30.0–36.0)
MCV: 90.6 fL (ref 80.0–100.0)
Monocytes Absolute: 0.3 10*3/uL (ref 0.1–1.0)
Monocytes Relative: 4 %
Neutro Abs: 4.8 10*3/uL (ref 1.7–7.7)
Neutrophils Relative %: 77 %
Platelets: 238 10*3/uL (ref 150–400)
RBC: 2.67 MIL/uL — ABNORMAL LOW (ref 3.87–5.11)
RDW: 22 % — ABNORMAL HIGH (ref 11.5–15.5)
WBC: 6.2 10*3/uL (ref 4.0–10.5)
nRBC: 0 % (ref 0.0–0.2)

## 2018-09-21 LAB — MAGNESIUM: Magnesium: 2 mg/dL (ref 1.7–2.4)

## 2018-09-21 LAB — GLUCOSE, CAPILLARY
Glucose-Capillary: 216 mg/dL — ABNORMAL HIGH (ref 70–99)
Glucose-Capillary: 210 mg/dL — ABNORMAL HIGH (ref 70–99)
Glucose-Capillary: 207 mg/dL — ABNORMAL HIGH (ref 70–99)
Glucose-Capillary: 206 mg/dL — ABNORMAL HIGH (ref 70–99)

## 2018-09-21 LAB — URIC ACID: Uric Acid, Serum: 0.7 mg/dL — ABNORMAL LOW (ref 2.5–7.1)

## 2018-09-21 LAB — COMPREHENSIVE METABOLIC PANEL
ALT: 10 U/L (ref 0–44)
AST: 13 U/L — ABNORMAL LOW (ref 15–41)
Albumin: 2.9 g/dL — ABNORMAL LOW (ref 3.5–5.0)
Alkaline Phosphatase: 76 U/L (ref 38–126)
Anion gap: 9 (ref 5–15)
BUN: 53 mg/dL — ABNORMAL HIGH (ref 8–23)
CO2: 25 mmol/L (ref 22–32)
Calcium: 8.3 mg/dL — ABNORMAL LOW (ref 8.9–10.3)
Chloride: 101 mmol/L (ref 98–111)
Creatinine, Ser: 1.31 mg/dL — ABNORMAL HIGH (ref 0.44–1.00)
GFR calc Af Amer: 48 mL/min — ABNORMAL LOW (ref >60)
GFR calc non Af Amer: 42 mL/min — ABNORMAL LOW (ref >60)
Glucose, Bld: 251 mg/dL — ABNORMAL HIGH (ref 70–99)
Potassium: 4.4 mmol/L (ref 3.5–5.1)
Sodium: 135 mmol/L (ref 135–145)
Total Bilirubin: 0.6 mg/dL (ref 0.3–1.2)
Total Protein: 5.6 g/dL — ABNORMAL LOW (ref 6.5–8.1)

## 2018-09-21 LAB — PREPARE RBC (CROSSMATCH)

## 2018-09-21 LAB — PHOSPHORUS: Phosphorus: 4.7 mg/dL — ABNORMAL HIGH (ref 2.5–4.6)

## 2018-09-21 MED ORDER — ACETAMINOPHEN 325 MG PO TABS
650.0000 mg | ORAL_TABLET | Freq: Once | ORAL | Status: AC
  Administered 2018-09-22: 650 mg via ORAL
  Filled 2018-09-21: qty 2

## 2018-09-21 MED ORDER — SENNA 8.6 MG PO TABS: 2.0000 | ORAL_TABLET | Freq: Two times a day (BID) | ORAL | 0 refills | Status: DC

## 2018-09-21 MED ORDER — GERHARDT'S BUTT CREAM: 1.0000 "application " | TOPICAL_CREAM | Freq: Three times a day (TID) | CUTANEOUS | 0 refills | Status: DC

## 2018-09-21 MED ORDER — ONDANSETRON HCL 8 MG PO TABS: 8.0000 mg | ORAL_TABLET | Freq: Three times a day (TID) | ORAL | 0 refills | Status: DC | PRN

## 2018-09-21 MED ORDER — POLYETHYLENE GLYCOL 3350 17 G PO PACK: 17.0000 g | PACK | Freq: Two times a day (BID) | ORAL | 0 refills | Status: DC

## 2018-09-21 MED ORDER — DOCUSATE SODIUM 100 MG PO CAPS: 300.0000 mg | ORAL_CAPSULE | Freq: Every day | ORAL | 0 refills | Status: DC

## 2018-09-21 MED ORDER — FUROSEMIDE 40 MG PO TABS: 40.0000 mg | ORAL_TABLET | Freq: Two times a day (BID) | ORAL | 0 refills | Status: DC

## 2018-09-21 MED ORDER — DIPHENHYDRAMINE HCL 25 MG PO CAPS
25.0000 mg | ORAL_CAPSULE | Freq: Once | ORAL | Status: AC
  Administered 2018-09-22: 25 mg via ORAL
  Filled 2018-09-21: qty 1

## 2018-09-21 MED ORDER — ALLOPURINOL 300 MG PO TABS: 300.0000 mg | ORAL_TABLET | Freq: Every day | ORAL | 0 refills | Status: DC

## 2018-09-21 MED ORDER — SODIUM CHLORIDE 0.9% IV SOLUTION: Freq: Once | INTRAVENOUS | Status: DC

## 2018-09-21 NOTE — Progress Notes (Signed)
PT Cancellation Note  Patient Details Name: Stephanie Combs MRN: [id number] DOB: February 14, 1951   Cancelled Treatment:     pt present with multiple reasons she can't do Physical Therapy.  Stated she has been up/down to [facility] several times to urinate.  Pt plans to D/C back home today after one unit.   Rica Koyanagi  PTA Acute  Rehabilitation Services Pager      [phone] Office      [phone]

## 2018-09-21 NOTE — Progress Notes (Signed)
HEMATOLOGY-ONCOLOGY PROGRESS NOTE  SUBJECTIVE: The patient feels all right this morning.  She has ongoing dyspnea exertion and cough.  Denies nausea and vomiting.  She has no other complaints this morning.  Hoping to go home tomorrow.  She is told that she would not like to follow-up for her care in [place] instead of going to [place].  REVIEW OF SYSTEMS:   Constitutional: Reports fatigue.  Denies fevers and chills. Eyes: Denies blurriness of vision Ears, nose, mouth, throat, and face: Denies mucositis or sore throat Respiratory: Reports cough and congestion. Cardiovascular: Denies palpitation and chest discomfort. Gastrointestinal: Reports mild nausea but no vomiting.  Denies change in bowel habits. Skin: Denies abnormal skin rashes Lymphatics: Denies new lymphadenopathy or easy bruising Neurological:Denies numbness, tingling or new weaknesses Behavioral/Psych: Mood is stable, no new changes  Extremities: Has ongoing lower extremity edema. All other systems were reviewed with the patient and are negative.  I have reviewed the past medical history, past surgical history, social history and family history with the patient and they are unchanged from previous note.   PHYSICAL EXAMINATION:  Vitals:   09/20/18 2204 09/21/18 0636  BP: 133/65 (!) 129/51  Pulse: 67 66  Resp: 20 17  Temp: 97.8 F (36.6 C) 98.5 F (36.9 C)  SpO2: 98% 99%   Filed Weights   09/17/18 0608 09/18/18 0406 09/19/18 0651  Weight: (!) 306 lb 9.6 oz (139.1 kg) (!) 313 lb 0.9 oz (142 kg) (!) 316 lb 9.3 oz (143.6 kg)    Intake/Output from previous day: 05/11 0701 - 05/12 0700 In: 1377.7 [P.O.:480; I.V.:3.4; IV Piggyback:894.3] Out: 2450 [Urine:2450]  GENERAL:alert, no distress and comfortable SKIN: skin color, texture, turgor are normal, no rashes or significant lesions EYES: normal, Conjunctiva are pink and non-injected, sclera clear OROPHARYNX:no exudate, no erythema and lips, buccal mucosa, and  tongue normal  NECK: supple, thyroid normal size, non-tender, without nodularity LYMPH:  no palpable lymphadenopathy in the cervical, axillary or inguinal LUNGS: Normal breathing effort.  Expiratory wheezes noted. HEART: regular rate & rhythm and no murmurs.  2+ lower extremity edema. ABDOMEN:abdomen soft, non-tender and normal bowel sounds Musculoskeletal:no cyanosis of digits and no clubbing  NEURO: alert & oriented x 3 with fluent speech, no focal motor/sensory deficits  LABORATORY DATA:  I have reviewed the data as listed CMP Latest Ref Rng & Units 09/21/2018 09/20/2018 09/19/2018  Glucose 70 - 99 mg/dL 251(H) 227(H) 292(H)  BUN 8 - 23 mg/dL 53(H) 53(H) 46(H)  Creatinine 0.44 - 1.00 mg/dL 1.31(H) 1.52(H) 1.28(H)  Sodium 135 - 145 mmol/L 135 136 135  Potassium 3.5 - 5.1 mmol/L 4.4 4.2 4.3  Chloride 98 - 111 mmol/L 101 101 97(L)  CO2 22 - 32 mmol/L 25 27 28   Calcium 8.9 - 10.3 mg/dL 8.3(L) 8.5(L) 8.6(L)  Total Protein 6.5 - 8.1 g/dL 5.6(L) 5.6(L) 5.9(L)  Total Bilirubin 0.3 - 1.2 mg/dL 0.6 0.3 0.6  Alkaline Phos 38 - 126 U/L 76 82 91  AST 15 - 41 U/L 13(L) 12(L) <5(L)  ALT 0 - 44 U/L 10 10 10     Lab Results  Component Value Date   WBC 6.2 09/21/2018   HGB 7.1 (L) 09/21/2018   HCT 24.2 (L) 09/21/2018   MCV 90.6 09/21/2018   PLT 238 09/21/2018   NEUTROABS 4.8 09/21/2018    Dg Chest 2 View  Result Date: 09/17/2018 CLINICAL DATA:  Recent onset of cough. EXAM: CHEST - 2 VIEW COMPARISON:  Chest CT from 6 days ago FINDINGS: Left  hilar mass with left upper lobe collapse, new. Normal heart size when accounting for rotation and low volumes. IMPRESSION: Collapse of the left upper lobe in this patient with known hilar mass. Electronically Signed   By: Monte Fantasia M.D.   On: 09/17/2018 11:18   Ct Chest W Contrast  Result Date: 09/11/2018 CLINICAL DATA:  68 year old female inpatient admitted with symptomatic anemia and decompensated heart failure, found to have hepatic flexure colon  mass on colonoscopy 09/09/2018. staging evaluation. EXAM: CT CHEST, ABDOMEN, AND PELVIS WITH CONTRAST TECHNIQUE: Multidetector CT imaging of the chest, abdomen and pelvis was performed following the standard protocol during bolus administration of intravenous contrast. CONTRAST:  165mL OMNIPAQUE IOHEXOL 300 MG/ML  SOLN COMPARISON:  08/30/2018 chest radiograph. 01/27/2017 chest CT angiogram. 04/05/2018 CT abdomen/pelvis. FINDINGS: CT CHEST FINDINGS Cardiovascular: Borderline mild cardiomegaly. No significant pericardial effusion/thickening. Left anterior descending coronary atherosclerosis. Atherosclerotic nonaneurysmal thoracic aorta. Dilated main pulmonary artery (3.5 cm diameter). No central pulmonary emboli. Mediastinum/Nodes: No discrete thyroid nodules. Unremarkable esophagus. No axillary adenopathy. Multiple enlarged left prevascular/AP window lymph nodes, largest 1.6 cm (series 3/image 18), new since 01/27/2017 chest CT. New enlarged 2.1 cm subcarinal node (series 3/image 23). No right hilar adenopathy. Lungs/Pleura: No pneumothorax. Trace dependent left pleural effusion. No right pleural effusion. Central perihilar left upper lobe 5.4 x 4.2 cm lung mass (series 3/image 27), new since 01/27/2017 chest CT, with associated high-grade stenosis of the left upper lobe bronchus. Postobstructive consolidation in lingula with some associated volume loss. No significant pulmonary nodules. Mild passive atelectasis in dependent lower lobes. Musculoskeletal: No aggressive appearing focal osseous lesions. Mild thoracic spondylosis. CT ABDOMEN PELVIS FINDINGS Hepatobiliary: Normal liver size. No liver mass. Normal gallbladder with no radiopaque cholelithiasis. No biliary ductal dilatation. Pancreas: Normal, with no mass or duct dilation. Spleen: Normal size. No mass. Adrenals/Urinary Tract: Left adrenal 2.4 cm nodule with density 14 HU, stable in size since 04/30/2015 CT, compatible with a benign adenoma. Stable appearance  of the right adrenal gland without discrete right adrenal nodule. Punctate nonobstructing interpolar right and upper left renal stones. No hydronephrosis. Subcentimeter hypodense renal cortical lesion in the lower left kidney is too small to characterize and is unchanged, considered benign. No additional renal lesions. Normal bladder. Stomach/Bowel: Small hiatal hernia. Otherwise normal nondistended stomach. Normal caliber small bowel with no small bowel wall thickening. Appendix not discretely visualized. No pericecal inflammatory changes. Oral contrast transits to the right colon. There is an apple-core lesion in the ascending colon measuring 4.0 x 3.9 cm (series 5/image 60) with irregular annular wall thickening. No additional sites of large bowel wall thickening. No significant colonic diverticulosis. No acute pericolonic fat stranding. Moderate diffuse colonic stool volume. Vascular/Lymphatic: Atherosclerotic nonaneurysmal abdominal aorta. Patent portal, splenic, hepatic and renal veins. Top-normal size 0.6 cm right mesenteric node (series 3/image 73). No pathologically enlarged lymph nodes in the abdomen or pelvis. Reproductive: Grossly normal uterus.  No adnexal mass. Other: No pneumoperitoneum, ascites or focal fluid collection. Musculoskeletal: No aggressive appearing focal osseous lesions. Marked lumbar spondylosis. IMPRESSION: 1. Central perihilar left upper lobe 5.4 cm lung mass, new since 01/27/2017 chest CT, compatible with malignancy, favor primary bronchogenic carcinoma. Postobstructive pneumonia/atelectasis in the lingula. 2. Mediastinal adenopathy in the left prevascular/AP window and subcarinal stations, compatible with metastatic nodes (more likely originating from the left upper lobe lung mass). 3. Trace dependent left pleural effusion. 4. Apple-core lesion in the ascending colon, compatible with known primary colonic neoplasm. Adjacent top-normal size right mesenteric lymph node is equivocal  for locoregional nodal metastasis. No additional potential findings of metastatic disease in the abdomen or pelvis. 5. Chronic findings include: Aortic Atherosclerosis (ICD10-I70.0). Borderline mild cardiomegaly. One vessel coronary atherosclerosis. Dilated main pulmonary artery, suggesting pulmonary arterial hypertension. Left adrenal adenoma. Punctate bilateral nephrolithiasis. Small hiatal hernia. Electronically Signed   By: Ilona Sorrel M.D.   On: 09/11/2018 16:42   Mr Jeri Cos TG Contrast  Result Date: 09/20/2018 CLINICAL DATA:  Small-cell lung cancer staging. EXAM: MRI HEAD WITHOUT AND WITH CONTRAST TECHNIQUE: Multiplanar, multiecho pulse sequences of the brain and surrounding structures were obtained without and with intravenous contrast. CONTRAST:  10 mL Gadavist COMPARISON:  Head CT 01/06/2018 FINDINGS: Some sequences are moderately to severely motion degraded despite repeated imaging attempts. Brain: There is no evidence of acute infarct, intracranial hemorrhage, mass, midline shift, or extra-axial fluid collection. Patchy to confluent T2 hyperintensities in the cerebral white matter and pons are nonspecific but compatible with moderate to severe chronic small vessel ischemic disease, mildly progressed from the prior MRI. There is mild cerebral atrophy. No enhancing brain lesions are identified, however sensitivity for detection of small lesions is reduced due to the degree of motion artifact. Vascular: Major intracranial vascular flow voids are preserved with the left vertebral artery being dominant. Skull and upper cervical spine: Diffuse heterogeneity in the bone marrow of the skull with hyperostosis frontalis noted. A few small foci of signal are noted in the skull on diffusion imaging which are new from the prior study with the largest measuring 1 cm in the lateral right frontal region and demonstrating apparent enhancement (series 3, image 38 and series 12, image 79). No destructive skull lesion  is identified. Sinuses/Orbits: Unremarkable orbits. Clear paranasal sinuses. Small right mastoid effusion. Other: None. IMPRESSION: 1. Motion degraded examination without brain metastases identified. 2. Small skull metastases not excluded. 3. Moderate to severe chronic small vessel ischemic disease. Electronically Signed   By: Logan Bores M.D.   On: 09/20/2018 18:56   Ct Abdomen Pelvis W Contrast  Result Date: 09/11/2018 CLINICAL DATA:  68 year old female inpatient admitted with symptomatic anemia and decompensated heart failure, found to have hepatic flexure colon mass on colonoscopy 09/09/2018. staging evaluation. EXAM: CT CHEST, ABDOMEN, AND PELVIS WITH CONTRAST TECHNIQUE: Multidetector CT imaging of the chest, abdomen and pelvis was performed following the standard protocol during bolus administration of intravenous contrast. CONTRAST:  166mL OMNIPAQUE IOHEXOL 300 MG/ML  SOLN COMPARISON:  08/30/2018 chest radiograph. 01/27/2017 chest CT angiogram. 04/05/2018 CT abdomen/pelvis. FINDINGS: CT CHEST FINDINGS Cardiovascular: Borderline mild cardiomegaly. No significant pericardial effusion/thickening. Left anterior descending coronary atherosclerosis. Atherosclerotic nonaneurysmal thoracic aorta. Dilated main pulmonary artery (3.5 cm diameter). No central pulmonary emboli. Mediastinum/Nodes: No discrete thyroid nodules. Unremarkable esophagus. No axillary adenopathy. Multiple enlarged left prevascular/AP window lymph nodes, largest 1.6 cm (series 3/image 18), new since 01/27/2017 chest CT. New enlarged 2.1 cm subcarinal node (series 3/image 23). No right hilar adenopathy. Lungs/Pleura: No pneumothorax. Trace dependent left pleural effusion. No right pleural effusion. Central perihilar left upper lobe 5.4 x 4.2 cm lung mass (series 3/image 27), new since 01/27/2017 chest CT, with associated high-grade stenosis of the left upper lobe bronchus. Postobstructive consolidation in lingula with some associated volume  loss. No significant pulmonary nodules. Mild passive atelectasis in dependent lower lobes. Musculoskeletal: No aggressive appearing focal osseous lesions. Mild thoracic spondylosis. CT ABDOMEN PELVIS FINDINGS Hepatobiliary: Normal liver size. No liver mass. Normal gallbladder with no radiopaque cholelithiasis. No biliary ductal dilatation. Pancreas: Normal, with no mass or duct dilation.  Spleen: Normal size. No mass. Adrenals/Urinary Tract: Left adrenal 2.4 cm nodule with density 14 HU, stable in size since 04/30/2015 CT, compatible with a benign adenoma. Stable appearance of the right adrenal gland without discrete right adrenal nodule. Punctate nonobstructing interpolar right and upper left renal stones. No hydronephrosis. Subcentimeter hypodense renal cortical lesion in the lower left kidney is too small to characterize and is unchanged, considered benign. No additional renal lesions. Normal bladder. Stomach/Bowel: Small hiatal hernia. Otherwise normal nondistended stomach. Normal caliber small bowel with no small bowel wall thickening. Appendix not discretely visualized. No pericecal inflammatory changes. Oral contrast transits to the right colon. There is an apple-core lesion in the ascending colon measuring 4.0 x 3.9 cm (series 5/image 60) with irregular annular wall thickening. No additional sites of large bowel wall thickening. No significant colonic diverticulosis. No acute pericolonic fat stranding. Moderate diffuse colonic stool volume. Vascular/Lymphatic: Atherosclerotic nonaneurysmal abdominal aorta. Patent portal, splenic, hepatic and renal veins. Top-normal size 0.6 cm right mesenteric node (series 3/image 73). No pathologically enlarged lymph nodes in the abdomen or pelvis. Reproductive: Grossly normal uterus.  No adnexal mass. Other: No pneumoperitoneum, ascites or focal fluid collection. Musculoskeletal: No aggressive appearing focal osseous lesions. Marked lumbar spondylosis. IMPRESSION: 1.  Central perihilar left upper lobe 5.4 cm lung mass, new since 01/27/2017 chest CT, compatible with malignancy, favor primary bronchogenic carcinoma. Postobstructive pneumonia/atelectasis in the lingula. 2. Mediastinal adenopathy in the left prevascular/AP window and subcarinal stations, compatible with metastatic nodes (more likely originating from the left upper lobe lung mass). 3. Trace dependent left pleural effusion. 4. Apple-core lesion in the ascending colon, compatible with known primary colonic neoplasm. Adjacent top-normal size right mesenteric lymph node is equivocal for locoregional nodal metastasis. No additional potential findings of metastatic disease in the abdomen or pelvis. 5. Chronic findings include: Aortic Atherosclerosis (ICD10-I70.0). Borderline mild cardiomegaly. One vessel coronary atherosclerosis. Dilated main pulmonary artery, suggesting pulmonary arterial hypertension. Left adrenal adenoma. Punctate bilateral nephrolithiasis. Small hiatal hernia. Electronically Signed   By: Ilona Sorrel M.D.   On: 09/11/2018 16:42   US Venous Img Lower Bilateral  Result Date: 09/08/2018 CLINICAL DATA:  Lower extremity pain EXAM: BILATERAL LOWER EXTREMITY VENOUS DUPLEX ULTRASOUND TECHNIQUE: Doppler venous assessment of the bilateral lower extremity deep venous system was performed, including characterization of spectral flow, compressibility, and phasicity. COMPARISON:  None. FINDINGS: There is complete compressibility of the bilateral common femoral, femoral, and popliteal veins. Doppler analysis demonstrates respiratory phasicity and augmentation of flow with calf compression. No obvious superficial vein or calf vein thrombosis. IMPRESSION: No evidence of lower extremity DVT. Electronically Signed   By: Marybelle Killings M.D.   On: 09/08/2018 13:27   Dg Chest Portable 1 View  Result Date: 08/30/2018 CLINICAL DATA:  Short of breath EXAM: PORTABLE CHEST 1 VIEW COMPARISON:  07/05/2018 FINDINGS: Mild  cardiac enlargement. Negative for heart failure or edema. Mild left lower lobe airspace disease and small left effusion. Right lung base clear. IMPRESSION: Mild left lower lobe atelectasis/infiltrate and small left effusion. Mild progression since the prior study. Electronically Signed   By: Franchot Gallo M.D.   On: 08/30/2018 21:33    ASSESSMENT AND PLAN: 1.  Colorectal adenocarcinoma, non-metastatic  2.  Small cell lung cancer, limited stage (pending brian MRI) 3.  Iron deficiency anemia 4.  Coronary artery disease 5.  Congestive heart failure 6.  Diabetes mellitus 7.  Hypertension 8.  Hypothyroidism  -The patient is more anemic with a hemoglobin of 7.1.  She received  recent chemotherapy and is symptomatic.  Will transfuse 1 unit packed red blood cells today. -Renal function stable.  Okay to stop IV fluids from our standpoint. -The patient may need radiation for small cell lung cancer treatment.  She is stated that she wants to receive this in [place].  Since she will become to [facility] on a regular basis for this, she states that she plans to have her medical oncology follow-ups in [place] as well.  Will discuss with Dr. Burr Medico and arrange for outpatient follow-up. -The patient will need G-CSF after discharge.  Mikey Bussing, DNP, AGPCNP-BC, AOCNP 09/21/18

## 2018-09-21 NOTE — Discharge Summary (Signed)
Physician Discharge Summary  Stephanie Combs:[id number] DOB: [birth year] DOA: 08/30/2018  PCP: [name], MD  Admit date: 08/30/2018 Discharge date: 09/21/2018  Admitted From: Home  Disposition:  Home   Recommendations for Outpatient Follow-up and new medication changes:  1. Follow up with Dr. Maudie Mercury in 7 days.  2. Furosemide has been increased to 40 mg po bid. 3. Continue taking allopurinol for 14 days. 4. Added bowel regimen and refilled ondansetron.  5. Refilled topical creams, (requesting a bigger jar)  Home Health: Yes   Equipment/Devices: no    Discharge Condition: stable  CODE STATUS: dnr   Diet recommendation: heart healthy and diabetic prudent.   Brief/Interim Summary: 68 year old female who presented with dyspnea and lower extremity edema. [place] have significant past medical history for coronary artery disease, diastolic heart failure, type 2 diabetes mellitus, chronic kidney disease stage III, hypertension, hypothyroidism and chronic lymphedema. She reported 5 days of worsening dyspnea along with lower extremity edema and dry cough. Symptoms were refractory to increased dose of oral furosemide. On her initial physical examination she was afebrile, pulse rate 84, respiratory rate 24, blood pressure 130/100, oxygenation 100% on supplemental oxygen. Her lungs were clear to auscultation bilaterally, heart S1-S2 present, rhythmic, no gallops, rubs or murmurs, the abdomen was protuberant, nontender, positive lower extremity edema. Sodium 138, potassium 4.2, chloride 102, bicarb 27, glucose 241, BUN 23, creatinine 1.58, AST 13, ALT 11, iron 9, TIBC 431, transferrin saturation 2, ferritin 4, white count 7.7, hemoglobin 7.0, hematocrit 35.0, platelets 377.  Patient was admitted to the hospital with a working diagnosis of symptomatic anemia and decompensated diastolic heart failure, acute on chronic.  Patient received 2 units packed red blood cells with improvement of her  hemoglobin and hematocrit.  Patient was successfully diuresed with IV furosemide, with improvement of her symptoms.  Further work-up with colonoscopy April 30 showed a colonic mass. [facility] for surgical evaluation.   At [facility] she was deemed not a good candidate for abdominal surgical intervention. On further work up she was found to have a large left hilar mass, consulted pulmonary and patient underwent bronchoscopy and biopsy.  Diagnosed with synchronous lung and colon cancer, transfer to [facility] for chemotherapy and brain MRI.  She tolerated first cycle of chemotherapy, 3 day. Her brain MRI with no brain metastatic lesions, small skull metastasis not excluded.   1.  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  Patient was seen by the oncology service, she received first cycle of chemotherapy, with good toleration, now she will continue follow-up at [place].  Her treatment will include radiation therapy that will be coordinated by the oncology team.  Her uric acid reach 9.0, she was treated with rasburicase and allopurinol her discharge uric acid is 0.7.   2.  Symptomatic iron deficiency anemia due to colonic cancer/adenocarcinoma.  Severe iron deficiency, serum iron 9, TIBC 413, ferritin 4 transferrin saturation 2.  Patient received a total of 3 units PRBCs during her hospital stay, along with 1 infusion of IV iron.  Will continue close monitoring of her cell count as an outpatient.  3.  Decompensated diastolic heart failure, acute on chronic, ejection fraction 65% with moderate increased LV wall thickness.  Patient received IV furosemide, with improvement of her symptoms, at discharge she will continue diuresis with 40 mg by mouth twice daily.  Patient continues to have lower extremity edema, may have a  component of venous insufficiency.  She will need close  follow-up of kidney function and electrolytes.  4.  Acute kidney injury chronic disease stage III.  She had a close monitoring of kidney function and electrolytes during her hospitalization, she has tolerated well diuresis with IV furosemide, her discharge creatinine is 1.31 with a sodium of 135 and potassium 4.4.  5.  Type 2 diabetes mellitus.  Patient received insulin, long-acting and short-acting during her hospital stay, at discharge she will continue on her regular home regimen of insulin.  6.  Morbid obesity with dyslipidemia.  Her calculated BMI is 51.1, she will need a close follow-up as an outpatient.  Continue statin for her dyslipidemia.  7.  Hypothyroidism.  Continue levothyroxine.  8.  Right leg cellulitis.  Was treated with antibiotics, clinically has resolved.   Discharge Diagnoses:  Principal Problem:   Anemia Active Problems:   DM type 2 (diabetes mellitus, type 2) (HCC)   HTN (hypertension), benign   CKD (chronic kidney disease), stage III (HCC)   Hypothyroidism   Bilateral lower extremity edema   Uncontrolled type 2 diabetes mellitus with stage 3 chronic kidney disease (HCC)   Obesity, Class III, BMI 40-49.9 (morbid obesity) (HCC)   CAD (coronary artery disease)   Symptomatic anemia   Acute on chronic diastolic CHF (congestive heart failure) (HCC)   Heme positive stool   Non-intractable vomiting   Abdominal pain, epigastric   Pain of lower extremity   Colonic mass   Colon polyps   Cellulitis of leg, left   Malignant neoplasm of hepatic flexure (HCC)   Small cell lung cancer, left upper lobe [facility])    Discharge Instructions  Discharge Instructions    Diet - low sodium heart healthy   Complete by:  As directed    Discharge instructions   Complete by:  As directed    Please follow with primary in 7 days.   Increase activity slowly   Complete by:  As directed    TREATMENT CONDITIONS   Complete by:  As directed    Patient should have CBC & CMP within 7  days prior to chemotherapy administration. NOTIFY MD IF: ANC < 1500, Hemoglobin < 8, PLT < 100,000,  Total Bili > 1.5, Creatinine > 1.5, ALT & AST > 80 or if patient has unstable vital signs: Temperature > 38.5, SBP > 180 or < 90, RR > 30 or HR > 100.     Allergies as of 09/21/2018      Reactions   Dilaudid [hydromorphone Hcl] Itching   Midodrine Hcl Swelling   After one dose had anaphylactic reaction, had to call EMS.   Actifed Cold-allergy [chlorpheniramine-phenylephrine]    "I was sick and red and it didn't agree with me at all"   Doxycycline Nausea And Vomiting   Other Cough   Pt states she is allergic to ragweed and that she starts coughing and sneezing like crazy   Penicillins Hives, Itching   Tolerates Rocephin Has patient had a PCN reaction causing immediate rash, facial/tongue/throat swelling, SOB or lightheadedness with hypotension: Yes Has patient had a PCN reaction causing severe rash involving mucus membranes or skin necrosis: No Has patient had a PCN reaction that required hospitalization Yes Has patient had a PCN reaction occurring within the last 10 years: No If all of the above answers are "NO", then may proceed with Cephalosporin use.   Reglan [metoclopramide] Itching   Valium [diazepam] Itching   Vistaril [hydroxyzine Hcl] Itching  Medication List    STOP taking these medications   ferrous sulfate 325 (65 FE) MG tablet     TAKE these medications   albuterol (2.5 MG/3ML) 0.083% nebulizer solution Commonly known as:  PROVENTIL Take 2.5 mg by nebulization 2 (two) times daily as needed for wheezing or shortness of breath.   albuterol 108 (90 Base) MCG/ACT inhaler Commonly known as:  ProAir HFA Inhale 2 puffs into the lungs every 4 (four) hours as needed. For shortness of breath   allopurinol 300 MG tablet Commonly known as:  ZYLOPRIM Take 1 tablet (300 mg total) by mouth daily for 15 days.   ALPRAZolam 1 MG tablet Commonly known as:  XANAX Take 1 mg by  mouth 3 (three) times daily as needed for anxiety or sleep.   aspirin EC 81 MG tablet Take 81 mg by mouth daily.   atorvastatin 80 MG tablet Commonly known as:  LIPITOR Take 1 tablet (80 mg total) by mouth daily at 6 PM. What changed:  when to take this   docusate sodium 100 MG capsule Commonly known as:  COLACE Take 3 capsules (300 mg total) by mouth daily for 30 days.   FISH OIL PO Take 1 capsule by mouth daily.   furosemide 40 MG tablet Commonly known as:  LASIX Take 1 tablet (40 mg total) by mouth 2 (two) times daily for 30 days. What changed:    medication strength  how much to take  how to take this  when to take this  additional instructions   gabapentin 600 MG tablet Commonly known as:  NEURONTIN Take 600 mg by mouth 4 (four) times daily.   Gerhardt's butt cream Crea Apply 1 application topically 3 (three) times daily.   insulin aspart 100 UNIT/ML injection Commonly known as:  novoLOG Inject 1-18 Units into the skin 3 (three) times daily with meals. What changed:  how much to take   insulin detemir 100 UNIT/ML injection Commonly known as:  Levemir Inject 0.4 mLs (40 Units total) into the skin at bedtime.   levothyroxine 200 MCG tablet Commonly known as:  SYNTHROID Take 1 tablet by mouth daily.   nitroGLYCERIN 0.4 MG SL tablet Commonly known as:  NITROSTAT Place 0.4 mg under the tongue every 5 (five) minutes as needed for chest pain.   nystatin powder Commonly known as:  MYCOSTATIN/NYSTOP Apply topically 2 (two) times daily.   ondansetron 8 MG tablet Commonly known as:  ZOFRAN Take 1 tablet (8 mg total) by mouth every 8 (eight) hours as needed for nausea or vomiting.   oxyCODONE-acetaminophen 10-325 MG tablet Commonly known as:  PERCOCET Take 1 tablet by mouth every 6 (six) hours as needed for pain.   OXYGEN Inhale 2 L into the lungs continuous.   pantoprazole 40 MG tablet Commonly known as:  PROTONIX Take 1 tablet (40 mg total) by mouth  2 (two) times daily.   polyethylene glycol 17 g packet Commonly known as:  MIRALAX / GLYCOLAX Take 17 g by mouth 2 (two) times daily.   potassium chloride 10 MEQ CR capsule Commonly known as:  MICRO-K Take 2 capsules (20 mEq total) by mouth 2 (two) times daily. *May take one additional tablet as needed for cramping   senna 8.6 MG Tabs tablet Commonly known as:  SENOKOT Take 2 tablets (17.2 mg total) by mouth 2 (two) times daily with a meal.   SURE COMFORT INS SYR 1CC/28G 28G X 1/2" 1 ML Misc Generic drug:  INS SYRINGE/NEEDLE  1CC/28G USE TO INJECT INSULIN UP TO 4 TIMES DAILY.   tiZANidine 4 MG tablet Commonly known as:  ZANAFLEX Take 1 tablet (4 mg total) by mouth every 8 (eight) hours as needed for muscle spasms.   topiramate 50 MG tablet Commonly known as:  TOPAMAX Take 150 mg by mouth every evening.   umeclidinium-vilanterol 62.5-25 MCG/INH Aepb Commonly known as:  ANORO ELLIPTA Inhale 1 puff into the lungs daily. What changed:    when to take this  reasons to take this   Vitamin D3 125 MCG (5000 UT) Caps Take 1 capsule (5,000 Units total) by mouth daily.      Follow-up Information    [name], MD Follow up in 7 day(s).   Specialty:  Internal Medicine Why:  Appointment Thursday, September 09, 2018, @ 3:30 pm  Contact information: [address] [place] [address] [phone]        Herminio Commons, MD .   Specialty:  Cardiology Contact information: [place] [address] [phone]        [facility] Follow up.   Why:  They will continue to do your home health care at your home Contact information: [name] [address] [phone]        [facility]. Schedule an appointment as soon as possible for a visit.   Specialty:  General Surgery Why:  Please call to make an appointment when instructed to by Oncology  Contact information: [address] [place] [address] [phone]          Allergies  Allergen Reactions  . Dilaudid [Hydromorphone Hcl] Itching  . Midodrine Hcl Swelling    After one dose had anaphylactic reaction, had to call EMS.  Marland Kitchen Actifed Cold-Allergy [Chlorpheniramine-Phenylephrine]     "I was sick and red and it didn't agree with me at all"  . Doxycycline Nausea And Vomiting  . Other Cough    Pt states she is allergic to ragweed and that she starts coughing and sneezing like crazy  . Penicillins Hives and Itching    Tolerates Rocephin Has patient had a PCN reaction causing immediate rash, facial/tongue/throat swelling, SOB or lightheadedness with hypotension: Yes Has patient had a PCN reaction causing severe rash involving mucus membranes or skin necrosis: No Has patient had a PCN reaction that required hospitalization Yes Has patient had a PCN reaction occurring within the last 10 years: No If all of the above answers are "NO", then may proceed with Cephalosporin use.   . Reglan [Metoclopramide] Itching  . Valium [Diazepam] Itching  . Vistaril [Hydroxyzine Hcl] Itching    Consultations:  Oncology   Surgery   Pulmonology  Palliative Care   Procedures/Studies: Dg Chest 2 View  Result Date: 09/17/2018 CLINICAL DATA:  Recent onset of cough. EXAM: CHEST - 2 VIEW COMPARISON:  Chest CT from 6 days ago FINDINGS: Left hilar mass with left upper lobe collapse, new. Normal heart size when accounting for rotation and low volumes. IMPRESSION: Collapse of the left upper lobe in this patient with known hilar mass. Electronically Signed   By: Monte Fantasia M.D.   On: 09/17/2018 11:18   Ct Chest W Contrast  Result Date: 09/11/2018 CLINICAL DATA:  68 year old female inpatient admitted with symptomatic anemia and decompensated heart failure, found to have hepatic flexure colon mass on colonoscopy 09/09/2018. staging evaluation. EXAM: CT CHEST, ABDOMEN, AND PELVIS WITH CONTRAST TECHNIQUE:  Multidetector CT  imaging of the chest, abdomen and pelvis was performed following the standard protocol during bolus administration of intravenous contrast. CONTRAST:  166mL OMNIPAQUE IOHEXOL 300 MG/ML  SOLN COMPARISON:  08/30/2018 chest radiograph. 01/27/2017 chest CT angiogram. 04/05/2018 CT abdomen/pelvis. FINDINGS: CT CHEST FINDINGS Cardiovascular: Borderline mild cardiomegaly. No significant pericardial effusion/thickening. Left anterior descending coronary atherosclerosis. Atherosclerotic nonaneurysmal thoracic aorta. Dilated main pulmonary artery (3.5 cm diameter). No central pulmonary emboli. Mediastinum/Nodes: No discrete thyroid nodules. Unremarkable esophagus. No axillary adenopathy. Multiple enlarged left prevascular/AP window lymph nodes, largest 1.6 cm (series 3/image 18), new since 01/27/2017 chest CT. New enlarged 2.1 cm subcarinal node (series 3/image 23). No right hilar adenopathy. Lungs/Pleura: No pneumothorax. Trace dependent left pleural effusion. No right pleural effusion. Central perihilar left upper lobe 5.4 x 4.2 cm lung mass (series 3/image 27), new since 01/27/2017 chest CT, with associated high-grade stenosis of the left upper lobe bronchus. Postobstructive consolidation in lingula with some associated volume loss. No significant pulmonary nodules. Mild passive atelectasis in dependent lower lobes. Musculoskeletal: No aggressive appearing focal osseous lesions. Mild thoracic spondylosis. CT ABDOMEN PELVIS FINDINGS Hepatobiliary: Normal liver size. No liver mass. Normal gallbladder with no radiopaque cholelithiasis. No biliary ductal dilatation. Pancreas: Normal, with no mass or duct dilation. Spleen: Normal size. No mass. Adrenals/Urinary Tract: Left adrenal 2.4 cm nodule with density 14 HU, stable in size since 04/30/2015 CT, compatible with a benign adenoma. Stable appearance of the right adrenal gland without discrete right adrenal nodule. Punctate nonobstructing interpolar right and upper left renal  stones. No hydronephrosis. Subcentimeter hypodense renal cortical lesion in the lower left kidney is too small to characterize and is unchanged, considered benign. No additional renal lesions. Normal bladder. Stomach/Bowel: Small hiatal hernia. Otherwise normal nondistended stomach. Normal caliber small bowel with no small bowel wall thickening. Appendix not discretely visualized. No pericecal inflammatory changes. Oral contrast transits to the right colon. There is an apple-core lesion in the ascending colon measuring 4.0 x 3.9 cm (series 5/image 60) with irregular annular wall thickening. No additional sites of large bowel wall thickening. No significant colonic diverticulosis. No acute pericolonic fat stranding. Moderate diffuse colonic stool volume. Vascular/Lymphatic: Atherosclerotic nonaneurysmal abdominal aorta. Patent portal, splenic, hepatic and renal veins. Top-normal size 0.6 cm right mesenteric node (series 3/image 73). No pathologically enlarged lymph nodes in the abdomen or pelvis. Reproductive: Grossly normal uterus.  No adnexal mass. Other: No pneumoperitoneum, ascites or focal fluid collection. Musculoskeletal: No aggressive appearing focal osseous lesions. Marked lumbar spondylosis. IMPRESSION: 1. Central perihilar left upper lobe 5.4 cm lung mass, new since 01/27/2017 chest CT, compatible with malignancy, favor primary bronchogenic carcinoma. Postobstructive pneumonia/atelectasis in the lingula. 2. Mediastinal adenopathy in the left prevascular/AP window and subcarinal stations, compatible with metastatic nodes (more likely originating from the left upper lobe lung mass). 3. Trace dependent left pleural effusion. 4. Apple-core lesion in the ascending colon, compatible with known primary colonic neoplasm. Adjacent top-normal size right mesenteric lymph node is equivocal for locoregional nodal metastasis. No additional potential findings of metastatic disease in the abdomen or pelvis. 5. Chronic  findings include: Aortic Atherosclerosis (ICD10-I70.0). Borderline mild cardiomegaly. One vessel coronary atherosclerosis. Dilated main pulmonary artery, suggesting pulmonary arterial hypertension. Left adrenal adenoma. Punctate bilateral nephrolithiasis. Small hiatal hernia. Electronically Signed   By: Ilona Sorrel M.D.   On: 09/11/2018 16:42   Mr Jeri Cos [place] Contrast  Result Date: 09/20/2018 CLINICAL DATA:  Small-cell lung cancer staging. EXAM: MRI HEAD WITHOUT AND WITH CONTRAST TECHNIQUE: Multiplanar, multiecho pulse  sequences of the brain and surrounding structures were obtained without and with intravenous contrast. CONTRAST:  10 mL Gadavist COMPARISON:  Head CT 01/06/2018 FINDINGS: Some sequences are moderately to severely motion degraded despite repeated imaging attempts. Brain: There is no evidence of acute infarct, intracranial hemorrhage, mass, midline shift, or extra-axial fluid collection. Patchy to confluent T2 hyperintensities in the cerebral white matter and pons are nonspecific but compatible with moderate to severe chronic small vessel ischemic disease, mildly progressed from the prior MRI. There is mild cerebral atrophy. No enhancing brain lesions are identified, however sensitivity for detection of small lesions is reduced due to the degree of motion artifact. Vascular: Major intracranial vascular flow voids are preserved with the left vertebral artery being dominant. Skull and upper cervical spine: Diffuse heterogeneity in the bone marrow of the skull with hyperostosis frontalis noted. A few small foci of signal are noted in the skull on diffusion imaging which are new from the prior study with the largest measuring 1 cm in the lateral right frontal region and demonstrating apparent enhancement (series 3, image 38 and series 12, image 79). No destructive skull lesion is identified. Sinuses/Orbits: Unremarkable orbits. Clear paranasal sinuses. Small right mastoid effusion. Other: None.  IMPRESSION: 1. Motion degraded examination without brain metastases identified. 2. Small skull metastases not excluded. 3. Moderate to severe chronic small vessel ischemic disease. Electronically Signed   By: Logan Bores M.D.   On: 09/20/2018 18:56   Ct Abdomen Pelvis W Contrast  Result Date: 09/11/2018 CLINICAL DATA:  68 year old female inpatient admitted with symptomatic anemia and decompensated heart failure, found to have hepatic flexure colon mass on colonoscopy 09/09/2018. staging evaluation. EXAM: CT CHEST, ABDOMEN, AND PELVIS WITH CONTRAST TECHNIQUE: Multidetector CT imaging of the chest, abdomen and pelvis was performed following the standard protocol during bolus administration of intravenous contrast. CONTRAST:  143mL OMNIPAQUE IOHEXOL 300 MG/ML  SOLN COMPARISON:  08/30/2018 chest radiograph. 01/27/2017 chest CT angiogram. 04/05/2018 CT abdomen/pelvis. FINDINGS: CT CHEST FINDINGS Cardiovascular: Borderline mild cardiomegaly. No significant pericardial effusion/thickening. Left anterior descending coronary atherosclerosis. Atherosclerotic nonaneurysmal thoracic aorta. Dilated main pulmonary artery (3.5 cm diameter). No central pulmonary emboli. Mediastinum/Nodes: No discrete thyroid nodules. Unremarkable esophagus. No axillary adenopathy. Multiple enlarged left prevascular/AP window lymph nodes, largest 1.6 cm (series 3/image 18), new since 01/27/2017 chest CT. New enlarged 2.1 cm subcarinal node (series 3/image 23). No right hilar adenopathy. Lungs/Pleura: No pneumothorax. Trace dependent left pleural effusion. No right pleural effusion. Central perihilar left upper lobe 5.4 x 4.2 cm lung mass (series 3/image 27), new since 01/27/2017 chest CT, with associated high-grade stenosis of the left upper lobe bronchus. Postobstructive consolidation in lingula with some associated volume loss. No significant pulmonary nodules. Mild passive atelectasis in dependent lower lobes. Musculoskeletal: No aggressive  appearing focal osseous lesions. Mild thoracic spondylosis. CT ABDOMEN PELVIS FINDINGS Hepatobiliary: Normal liver size. No liver mass. Normal gallbladder with no radiopaque cholelithiasis. No biliary ductal dilatation. Pancreas: Normal, with no mass or duct dilation. Spleen: Normal size. No mass. Adrenals/Urinary Tract: Left adrenal 2.4 cm nodule with density 14 HU, stable in size since 04/30/2015 CT, compatible with a benign adenoma. Stable appearance of the right adrenal gland without discrete right adrenal nodule. Punctate nonobstructing interpolar right and upper left renal stones. No hydronephrosis. Subcentimeter hypodense renal cortical lesion in the lower left kidney is too small to characterize and is unchanged, considered benign. No additional renal lesions. Normal bladder. Stomach/Bowel: Small hiatal hernia. Otherwise normal nondistended stomach. Normal caliber small bowel with no  small bowel wall thickening. Appendix not discretely visualized. No pericecal inflammatory changes. Oral contrast transits to the right colon. There is an apple-core lesion in the ascending colon measuring 4.0 x 3.9 cm (series 5/image 60) with irregular annular wall thickening. No additional sites of large bowel wall thickening. No significant colonic diverticulosis. No acute pericolonic fat stranding. Moderate diffuse colonic stool volume. Vascular/Lymphatic: Atherosclerotic nonaneurysmal abdominal aorta. Patent portal, splenic, hepatic and renal veins. Top-normal size 0.6 cm right mesenteric node (series 3/image 73). No pathologically enlarged lymph nodes in the abdomen or pelvis. Reproductive: Grossly normal uterus.  No adnexal mass. Other: No pneumoperitoneum, ascites or focal fluid collection. Musculoskeletal: No aggressive appearing focal osseous lesions. Marked lumbar spondylosis. IMPRESSION: 1. Central perihilar left upper lobe 5.4 cm lung mass, new since 01/27/2017 chest CT, compatible with malignancy, favor primary  bronchogenic carcinoma. Postobstructive pneumonia/atelectasis in the lingula. 2. Mediastinal adenopathy in the left prevascular/AP window and subcarinal stations, compatible with metastatic nodes (more likely originating from the left upper lobe lung mass). 3. Trace dependent left pleural effusion. 4. Apple-core lesion in the ascending colon, compatible with known primary colonic neoplasm. Adjacent top-normal size right mesenteric lymph node is equivocal for locoregional nodal metastasis. No additional potential findings of metastatic disease in the abdomen or pelvis. 5. Chronic findings include: Aortic Atherosclerosis (ICD10-I70.0). Borderline mild cardiomegaly. One vessel coronary atherosclerosis. Dilated main pulmonary artery, suggesting pulmonary arterial hypertension. Left adrenal adenoma. Punctate bilateral nephrolithiasis. Small hiatal hernia. Electronically Signed   By: Ilona Sorrel M.D.   On: 09/11/2018 16:42   US Venous Img Lower Bilateral  Result Date: 09/08/2018 CLINICAL DATA:  Lower extremity pain EXAM: BILATERAL LOWER EXTREMITY VENOUS DUPLEX ULTRASOUND TECHNIQUE: Doppler venous assessment of the bilateral lower extremity deep venous system was performed, including characterization of spectral flow, compressibility, and phasicity. COMPARISON:  None. FINDINGS: There is complete compressibility of the bilateral common femoral, femoral, and popliteal veins. Doppler analysis demonstrates respiratory phasicity and augmentation of flow with calf compression. No obvious superficial vein or calf vein thrombosis. IMPRESSION: No evidence of lower extremity DVT. Electronically Signed   By: Marybelle Killings M.D.   On: 09/08/2018 13:27   Dg Chest Portable 1 View  Result Date: 08/30/2018 CLINICAL DATA:  Short of breath EXAM: PORTABLE CHEST 1 VIEW COMPARISON:  07/05/2018 FINDINGS: Mild cardiac enlargement. Negative for heart failure or edema. Mild left lower lobe airspace disease and small left effusion. Right  lung base clear. IMPRESSION: Mild left lower lobe atelectasis/infiltrate and small left effusion. Mild progression since the prior study. Electronically Signed   By: Franchot Gallo M.D.   On: 08/30/2018 21:33       Procedures:   Colonoscopy with biopsy   Bronchoscopy with biopsy  Subjective: Patient is feeling better, intermittent dyspepsia and nausea, controlled with antiacids and antiemetics.   Discharge Exam: Vitals:   09/20/18 2204 09/21/18 0636  BP: 133/65 (!) 129/51  Pulse: 67 66  Resp: 20 17  Temp: 97.8 F (36.6 C) 98.5 F (36.9 C)  SpO2: 98% 99%   Vitals:   09/20/18 0707 09/20/18 1327 09/20/18 2204 09/21/18 0636  BP: 109/66 125/75 133/65 (!) 129/51  Pulse: 61 69 67 66  Resp: 19 18 20 17   Temp: 97.9 F (36.6 C) 98.3 F (36.8 C) 97.8 F (36.6 C) 98.5 F (36.9 C)  TempSrc: Oral Oral  Oral  SpO2: 100% 98% 98% 99%  Weight:      Height:        General: deconditioned  Neurology: Awake  and alert, non focal  E ENT: mild pallor, no icterus, oral mucosa moist Cardiovascular: No JVD. S1-S2 present, rhythmic, no gallops, rubs, or murmurs. +++ non pitting lower extremity edema. Pulmonary: positive breath sounds bilaterally, no wheezing, rhonchi or rales.  Gastrointestinal. Abdomen protuberant, no organomegaly, non tender, no rebound or guarding Skin. No rashes Musculoskeletal: no joint deformities   The results of significant diagnostics from this hospitalization (including imaging, microbiology, ancillary and laboratory) are listed below for reference.     Microbiology: Recent Results (from the past 240 hour(s))  Fungus Culture With Stain     Status: None (Preliminary result)   Collection Time: 09/14/18  1:37 PM  Result Value Ref Range Status   Fungus Stain Final report  Final    Comment: (NOTE) Performed At: [facility] [place], [name] [id number] Rush Farmer MD AC:[id number]    Fungus (Mycology) Culture PENDING  Incomplete    Fungal Source BRONCHIAL ALVEOLAR LAVAGE  Final    Comment: Performed at [facility], [place] [address] [place], [place] [address]  Culture, respiratory     Status: None   Collection Time: 09/14/18  1:37 PM  Result Value Ref Range Status   Specimen Description BRONCHIAL ALVEOLAR LAVAGE  Final   Special Requests NONE  Final   Gram Stain   Final    FEW WBC PRESENT,BOTH PMN AND MONONUCLEAR NO ORGANISMS SEEN    Culture   Final    NO GROWTH 2 DAYS Performed at [facility], [place] [address], [place], [place] [address]    Report Status 09/17/2018 FINAL  Final  Acid Fast Smear (AFB)     Status: None   Collection Time: 09/14/18  1:37 PM  Result Value Ref Range Status   AFB Specimen Processing Concentration  Final   Acid Fast Smear Negative  Final    Comment: (NOTE) Performed At: [facility] [place], [name] [id number] Rush Farmer MD WC:[id number]    Source (AFB) BRONCHIAL ALVEOLAR LAVAGE  Final    Comment: Performed at [facility], [place] [address], [place], [place] [address]  Fungus Culture Result     Status: None   Collection Time: 09/14/18  1:37 PM  Result Value Ref Range Status   Result 1 Comment  Final    Comment: (NOTE) KOH/Calcofluor preparation:  no fungus observed. Performed At: [facility] [place], [name] [id number] Rush Farmer MD WN:[id number]      Labs: BNP (last 3 results) Recent Labs    01/06/18 1820 07/05/18 1138 08/30/18 2115  BNP 257.0* 229.0* 381.8*   Basic Metabolic Panel: Recent Labs  Lab 09/17/18 0855 09/18/18 0602 09/19/18 0900 09/20/18 0531 09/21/18 0530  NA 136 135 135 136 135  K 4.0 4.3 4.3 4.2 4.4  CL 99 97* 97* 101 101  CO2 27 28 28 27 25   GLUCOSE 226* 317* 292* 227* 251*  BUN 40* 41* 46* 53* 53*  CREATININE 1.28* 1.34* 1.28* 1.52* 1.31*  CALCIUM 8.7* 8.6* 8.6* 8.5* 8.3*  MG  --   --   --  2.2 2.0  PHOS  --   --  4.7* 5.0* 4.7*   Liver Function Tests: Recent Labs   Lab 09/18/18 0602 09/19/18 0900 09/20/18 0531 09/21/18 0530  AST 13* <5* 12* 13*  ALT 10 10 10 10   ALKPHOS 97 91 82 76  BILITOT 0.6 0.6 0.3 0.6  PROT 6.1* 5.9* 5.6* 5.6*  ALBUMIN 3.2* 3.0*  3.0* 2.9*   No results for input(s): LIPASE, AMYLASE in the last 168 hours. No results for input(s): AMMONIA in the last 168 hours. CBC: Recent Labs  Lab 09/15/18 0514 09/17/18 0855 09/18/18 0602 09/21/18 0530  WBC 7.0 7.5 7.3 6.2  NEUTROABS  --   --  4.1 4.8  HGB 7.6* 7.5* 8.0* 7.1*  HCT 25.5* 25.5* 29.0* 24.2*  MCV 86.1 87.9 91.2 90.6  PLT 267 269 281 238   Cardiac Enzymes: No results for input(s): CKTOTAL, CKMB, CKMBINDEX, TROPONINI in the last 168 hours. BNP: Invalid input(s): POCBNP CBG: Recent Labs  Lab 09/20/18 0740 09/20/18 1220 09/20/18 1713 09/20/18 2048 09/21/18 0748  GLUCAP 215* 205* 226* 233* 216*   D-Dimer No results for input(s): DDIMER in the last 72 hours. Hgb A1c No results for input(s): HGBA1C in the last 72 hours. Lipid Profile No results for input(s): CHOL, HDL, LDLCALC, TRIG, CHOLHDL, LDLDIRECT in the last 72 hours. Thyroid function studies No results for input(s): TSH, T4TOTAL, T3FREE, THYROIDAB in the last 72 hours.  Invalid input(s): FREET3 Anemia work up No results for input(s): VITAMINB12, FOLATE, FERRITIN, TIBC, IRON, RETICCTPCT in the last 72 hours. Urinalysis    Component Value Date/Time   COLORURINE YELLOW 04/05/2018 1559   APPEARANCEUR HAZY (A) 04/05/2018 1559   LABSPEC >1.030 (H) 04/05/2018 1559   PHURINE 5.5 04/05/2018 1559   GLUCOSEU NEGATIVE 04/05/2018 1559   HGBUR SMALL (A) 04/05/2018 1559   BILIRUBINUR SMALL (A) 04/05/2018 1559   KETONESUR 15 (A) 04/05/2018 1559   PROTEINUR 100 (A) 04/05/2018 1559   UROBILINOGEN 0.2 12/04/2014 0650   NITRITE NEGATIVE 04/05/2018 1559   LEUKOCYTESUR SMALL (A) 04/05/2018 1559   Sepsis Labs Invalid input(s): PROCALCITONIN,  WBC,  LACTICIDVEN Microbiology Recent Results (from the past 240  hour(s))  Fungus Culture With Stain     Status: None (Preliminary result)   Collection Time: 09/14/18  1:37 PM  Result Value Ref Range Status   Fungus Stain Final report  Final    Comment: (NOTE) Performed At: [facility] [place], [name] [id number] Rush Farmer MD AS:[id number]    Fungus (Mycology) Culture PENDING  Incomplete   Fungal Source BRONCHIAL ALVEOLAR LAVAGE  Final    Comment: Performed at [facility], [place] [address], [place], [place] [address]  Culture, respiratory     Status: None   Collection Time: 09/14/18  1:37 PM  Result Value Ref Range Status   Specimen Description BRONCHIAL ALVEOLAR LAVAGE  Final   Special Requests NONE  Final   Gram Stain   Final    FEW WBC PRESENT,BOTH PMN AND MONONUCLEAR NO ORGANISMS SEEN    Culture   Final    NO GROWTH 2 DAYS Performed at [facility], [place] [address] [place], [place] [address]    Report Status 09/17/2018 FINAL  Final  Acid Fast Smear (AFB)     Status: None   Collection Time: 09/14/18  1:37 PM  Result Value Ref Range Status   AFB Specimen Processing Concentration  Final   Acid Fast Smear Negative  Final    Comment: (NOTE) Performed At: [facility] [place], [name] [id number] Rush Farmer MD QA:[id number]    Source (AFB) BRONCHIAL ALVEOLAR LAVAGE  Final    Comment: Performed at [facility], [place] [address], [place], [place] [address]  Fungus Culture Result     Status: None   Collection Time: 09/14/18  1:37 PM  Result Value  Ref Range Status   Result 1 Comment  Final    Comment: (NOTE) KOH/Calcofluor preparation:  no fungus observed. Performed At: [facility] [place], [name] [id number] Rush Farmer MD JL:[id number]      Time coordinating discharge: 45 minutes  SIGNED:   Tawni Millers, MD  [facility] 09/21/2018, 8:48 AM

## 2018-09-22 ENCOUNTER — Inpatient Hospital Stay (HOSPITAL_COMMUNITY): Payer: Medicare Other

## 2018-09-22 ENCOUNTER — Other Ambulatory Visit: Payer: Self-pay | Admitting: *Deleted

## 2018-09-22 ENCOUNTER — Inpatient Hospital Stay: Admit: 2018-09-22 | Payer: Medicare Other | Admitting: Radiation Oncology

## 2018-09-22 ENCOUNTER — Other Ambulatory Visit: Payer: Self-pay | Admitting: Radiation Oncology

## 2018-09-22 ENCOUNTER — Telehealth: Payer: Self-pay | Admitting: Hematology

## 2018-09-22 DIAGNOSIS — Z748 Other problems related to care provider dependency: Secondary | ICD-10-CM | POA: Insufficient documentation

## 2018-09-22 LAB — GLUCOSE, CAPILLARY
Glucose-Capillary: 118 mg/dL — ABNORMAL HIGH (ref 70–99)
Glucose-Capillary: 74 mg/dL (ref 70–99)
Glucose-Capillary: 160 mg/dL — ABNORMAL HIGH (ref 70–99)

## 2018-09-22 LAB — URIC ACID: Uric Acid, Serum: 1.2 mg/dL — ABNORMAL LOW (ref 2.5–7.1)

## 2018-09-22 LAB — COMPREHENSIVE METABOLIC PANEL
ALT: 12 U/L (ref 0–44)
AST: 15 U/L (ref 15–41)
Albumin: 3 g/dL — ABNORMAL LOW (ref 3.5–5.0)
Alkaline Phosphatase: 75 U/L (ref 38–126)
Anion gap: 6 (ref 5–15)
BUN: 59 mg/dL — ABNORMAL HIGH (ref 8–23)
CO2: 27 mmol/L (ref 22–32)
Calcium: 8 mg/dL — ABNORMAL LOW (ref 8.9–10.3)
Chloride: 101 mmol/L (ref 98–111)
Creatinine, Ser: 1.46 mg/dL — ABNORMAL HIGH (ref 0.44–1.00)
GFR calc Af Amer: 42 mL/min — ABNORMAL LOW (ref >60)
GFR calc non Af Amer: 37 mL/min — ABNORMAL LOW (ref >60)
Glucose, Bld: 131 mg/dL — ABNORMAL HIGH (ref 70–99)
Potassium: 4 mmol/L (ref 3.5–5.1)
Sodium: 134 mmol/L — ABNORMAL LOW (ref 135–145)
Total Bilirubin: 0.6 mg/dL (ref 0.3–1.2)
Total Protein: 5.6 g/dL — ABNORMAL LOW (ref 6.5–8.1)

## 2018-09-22 LAB — HEMOGLOBIN AND HEMATOCRIT, BLOOD
HCT: 29 % — ABNORMAL LOW (ref 36.0–46.0)
Hemoglobin: 8.5 g/dL — ABNORMAL LOW (ref 12.0–15.0)

## 2018-09-22 LAB — PHOSPHORUS: Phosphorus: 5.2 mg/dL — ABNORMAL HIGH (ref 2.5–4.6)

## 2018-09-22 LAB — MAGNESIUM: Magnesium: 2.1 mg/dL (ref 1.7–2.4)

## 2018-09-22 MED ORDER — FUROSEMIDE 40 MG PO TABS: 40.0000 mg | ORAL_TABLET | Freq: Two times a day (BID) | ORAL | 0 refills | Status: DC

## 2018-09-22 MED ORDER — ONDANSETRON HCL 8 MG PO TABS: 8.0000 mg | ORAL_TABLET | Freq: Three times a day (TID) | ORAL | 0 refills | Status: DC | PRN

## 2018-09-22 MED ORDER — ALBUTEROL SULFATE HFA 108 (90 BASE) MCG/ACT IN AERS: 2.0000 | INHALATION_SPRAY | RESPIRATORY_TRACT | 0 refills | Status: AC | PRN

## 2018-09-22 MED ORDER — GERHARDT'S BUTT CREAM: 1.0000 "application " | TOPICAL_CREAM | Freq: Three times a day (TID) | CUTANEOUS | 0 refills | Status: DC

## 2018-09-22 NOTE — Progress Notes (Signed)
I attempted to contact the patient's daughter but her VM is not set up. I am not sure if the daughter is her legal guardian, but her chart states she has a guardian.

## 2018-09-22 NOTE — Progress Notes (Addendum)
HEMATOLOGY-ONCOLOGY PROGRESS NOTE  SUBJECTIVE: The patient reports that she is not feeling well this morning.  She reports increased fatigue and weakness.  Reports increased cough and shortness of breath.  Denies nausea and vomiting.  Denies bleeding.  She has no other complaints this morning.  REVIEW OF SYSTEMS:   Constitutional: Reports fatigue and generalized weakness..  Denies fevers and chills. Eyes: Denies blurriness of vision Ears, nose, mouth, throat, and face: Denies mucositis or sore throat Respiratory: Reports cough and congestion. Cardiovascular: Denies palpitation and chest discomfort. Gastrointestinal: Denies nausea and vomiting.  Denies change in bowel habits. Skin: Denies abnormal skin rashes  Lymphatics: Denies new lymphadenopathy or easy bruising Neurological:Denies numbness, tingling or new weaknesses Behavioral/Psych: Mood is stable, no new changes  Extremities: Has ongoing lower extremity edema. All other systems were reviewed with the patient and are negative.  I have reviewed the past medical history, past surgical history, social history and family history with the patient and they are unchanged from previous note.   PHYSICAL EXAMINATION:  Vitals:   09/22/18 0201 09/22/18 0552  BP: (!) 114/50 (!) 112/57  Pulse: 75 72  Resp: 14 16  Temp: 98.4 F (36.9 C) 98 F (36.7 C)  SpO2: 100% 99%   Filed Weights   09/17/18 0608 09/18/18 0406 09/19/18 0651  Weight: (!) 306 lb 9.6 oz (139.1 kg) (!) 313 lb 0.9 oz (142 kg) (!) 316 lb 9.3 oz (143.6 kg)    Intake/Output from previous day: 05/12 0701 - 05/13 0700 In: 2057.3 [P.O.:1640; I.V.:3; Blood:315; IV Piggyback:99.3] Out: -   GENERAL:alert, no distress and comfortable SKIN: skin color, texture, turgor are normal, no rashes or significant lesions EYES: normal, Conjunctiva are pink and non-injected, sclera clear OROPHARYNX:no exudate, no erythema and lips, buccal mucosa, and tongue normal  NECK: supple, thyroid  normal size, non-tender, without nodularity LYMPH:  no palpable lymphadenopathy in the cervical, axillary or inguinal LUNGS: Normal breathing effort.  Expiratory wheezes noted. HEART: regular rate & rhythm and no murmurs.  2+ lower extremity edema. ABDOMEN:abdomen soft, non-tender and normal bowel sounds Musculoskeletal:no cyanosis of digits and no clubbing  NEURO: alert & oriented x 3 with fluent speech, no focal motor/sensory deficits  LABORATORY DATA:  I have reviewed the data as listed CMP Latest Ref Rng & Units 09/22/2018 09/21/2018 09/20/2018  Glucose 70 - 99 mg/dL 131(H) 251(H) 227(H)  BUN 8 - 23 mg/dL 59(H) 53(H) 53(H)  Creatinine 0.44 - 1.00 mg/dL 1.46(H) 1.31(H) 1.52(H)  Sodium 135 - 145 mmol/L 134(L) 135 136  Potassium 3.5 - 5.1 mmol/L 4.0 4.4 4.2  Chloride 98 - 111 mmol/L 101 101 101  CO2 22 - 32 mmol/L 27 25 27   Calcium 8.9 - 10.3 mg/dL 8.0(L) 8.3(L) 8.5(L)  Total Protein 6.5 - 8.1 g/dL 5.6(L) 5.6(L) 5.6(L)  Total Bilirubin 0.3 - 1.2 mg/dL 0.6 0.6 0.3  Alkaline Phos 38 - 126 U/L 75 76 82  AST 15 - 41 U/L 15 13(L) 12(L)  ALT 0 - 44 U/L 12 10 10     Lab Results  Component Value Date   WBC 6.2 09/21/2018   HGB 7.1 (L) 09/21/2018   HCT 24.2 (L) 09/21/2018   MCV 90.6 09/21/2018   PLT 238 09/21/2018   NEUTROABS 4.8 09/21/2018    Dg Chest 2 View  Result Date: 09/17/2018 CLINICAL DATA:  Recent onset of cough. EXAM: CHEST - 2 VIEW COMPARISON:  Chest CT from 6 days ago FINDINGS: Left hilar mass with left upper lobe collapse, new. Normal  heart size when accounting for rotation and low volumes. IMPRESSION: Collapse of the left upper lobe in this patient with known hilar mass. Electronically Signed   By: Monte Fantasia M.D.   On: 09/17/2018 11:18   Ct Chest W Contrast  Result Date: 09/11/2018 CLINICAL DATA:  68 year old female inpatient admitted with symptomatic anemia and decompensated heart failure, found to have hepatic flexure colon mass on colonoscopy 09/09/2018. staging  evaluation. EXAM: CT CHEST, ABDOMEN, AND PELVIS WITH CONTRAST TECHNIQUE: Multidetector CT imaging of the chest, abdomen and pelvis was performed following the standard protocol during bolus administration of intravenous contrast. CONTRAST:  113mL OMNIPAQUE IOHEXOL 300 MG/ML  SOLN COMPARISON:  08/30/2018 chest radiograph. 01/27/2017 chest CT angiogram. 04/05/2018 CT abdomen/pelvis. FINDINGS: CT CHEST FINDINGS Cardiovascular: Borderline mild cardiomegaly. No significant pericardial effusion/thickening. Left anterior descending coronary atherosclerosis. Atherosclerotic nonaneurysmal thoracic aorta. Dilated main pulmonary artery (3.5 cm diameter). No central pulmonary emboli. Mediastinum/Nodes: No discrete thyroid nodules. Unremarkable esophagus. No axillary adenopathy. Multiple enlarged left prevascular/AP window lymph nodes, largest 1.6 cm (series 3/image 18), new since 01/27/2017 chest CT. New enlarged 2.1 cm subcarinal node (series 3/image 23). No right hilar adenopathy. Lungs/Pleura: No pneumothorax. Trace dependent left pleural effusion. No right pleural effusion. Central perihilar left upper lobe 5.4 x 4.2 cm lung mass (series 3/image 27), new since 01/27/2017 chest CT, with associated high-grade stenosis of the left upper lobe bronchus. Postobstructive consolidation in lingula with some associated volume loss. No significant pulmonary nodules. Mild passive atelectasis in dependent lower lobes. Musculoskeletal: No aggressive appearing focal osseous lesions. Mild thoracic spondylosis. CT ABDOMEN PELVIS FINDINGS Hepatobiliary: Normal liver size. No liver mass. Normal gallbladder with no radiopaque cholelithiasis. No biliary ductal dilatation. Pancreas: Normal, with no mass or duct dilation. Spleen: Normal size. No mass. Adrenals/Urinary Tract: Left adrenal 2.4 cm nodule with density 14 HU, stable in size since 04/30/2015 CT, compatible with a benign adenoma. Stable appearance of the right adrenal gland without  discrete right adrenal nodule. Punctate nonobstructing interpolar right and upper left renal stones. No hydronephrosis. Subcentimeter hypodense renal cortical lesion in the lower left kidney is too small to characterize and is unchanged, considered benign. No additional renal lesions. Normal bladder. Stomach/Bowel: Small hiatal hernia. Otherwise normal nondistended stomach. Normal caliber small bowel with no small bowel wall thickening. Appendix not discretely visualized. No pericecal inflammatory changes. Oral contrast transits to the right colon. There is an apple-core lesion in the ascending colon measuring 4.0 x 3.9 cm (series 5/image 60) with irregular annular wall thickening. No additional sites of large bowel wall thickening. No significant colonic diverticulosis. No acute pericolonic fat stranding. Moderate diffuse colonic stool volume. Vascular/Lymphatic: Atherosclerotic nonaneurysmal abdominal aorta. Patent portal, splenic, hepatic and renal veins. Top-normal size 0.6 cm right mesenteric node (series 3/image 73). No pathologically enlarged lymph nodes in the abdomen or pelvis. Reproductive: Grossly normal uterus.  No adnexal mass. Other: No pneumoperitoneum, ascites or focal fluid collection. Musculoskeletal: No aggressive appearing focal osseous lesions. Marked lumbar spondylosis. IMPRESSION: 1. Central perihilar left upper lobe 5.4 cm lung mass, new since 01/27/2017 chest CT, compatible with malignancy, favor primary bronchogenic carcinoma. Postobstructive pneumonia/atelectasis in the lingula. 2. Mediastinal adenopathy in the left prevascular/AP window and subcarinal stations, compatible with metastatic nodes (more likely originating from the left upper lobe lung mass). 3. Trace dependent left pleural effusion. 4. Apple-core lesion in the ascending colon, compatible with known primary colonic neoplasm. Adjacent top-normal size right mesenteric lymph node is equivocal for locoregional nodal metastasis. No  additional potential findings  of metastatic disease in the abdomen or pelvis. 5. Chronic findings include: Aortic Atherosclerosis (ICD10-I70.0). Borderline mild cardiomegaly. One vessel coronary atherosclerosis. Dilated main pulmonary artery, suggesting pulmonary arterial hypertension. Left adrenal adenoma. Punctate bilateral nephrolithiasis. Small hiatal hernia. Electronically Signed   By: Ilona Sorrel M.D.   On: 09/11/2018 16:42   Mr Jeri Cos ZY Contrast  Result Date: 09/20/2018 CLINICAL DATA:  Small-cell lung cancer staging. EXAM: MRI HEAD WITHOUT AND WITH CONTRAST TECHNIQUE: Multiplanar, multiecho pulse sequences of the brain and surrounding structures were obtained without and with intravenous contrast. CONTRAST:  10 mL Gadavist COMPARISON:  Head CT 01/06/2018 FINDINGS: Some sequences are moderately to severely motion degraded despite repeated imaging attempts. Brain: There is no evidence of acute infarct, intracranial hemorrhage, mass, midline shift, or extra-axial fluid collection. Patchy to confluent T2 hyperintensities in the cerebral white matter and pons are nonspecific but compatible with moderate to severe chronic small vessel ischemic disease, mildly progressed from the prior MRI. There is mild cerebral atrophy. No enhancing brain lesions are identified, however sensitivity for detection of small lesions is reduced due to the degree of motion artifact. Vascular: Major intracranial vascular flow voids are preserved with the left vertebral artery being dominant. Skull and upper cervical spine: Diffuse heterogeneity in the bone marrow of the skull with hyperostosis frontalis noted. A few small foci of signal are noted in the skull on diffusion imaging which are new from the prior study with the largest measuring 1 cm in the lateral right frontal region and demonstrating apparent enhancement (series 3, image 38 and series 12, image 79). No destructive skull lesion is identified. Sinuses/Orbits:  Unremarkable orbits. Clear paranasal sinuses. Small right mastoid effusion. Other: None. IMPRESSION: 1. Motion degraded examination without brain metastases identified. 2. Small skull metastases not excluded. 3. Moderate to severe chronic small vessel ischemic disease. Electronically Signed   By: Logan Bores M.D.   On: 09/20/2018 18:56   Ct Abdomen Pelvis W Contrast  Result Date: 09/11/2018 CLINICAL DATA:  68 year old female inpatient admitted with symptomatic anemia and decompensated heart failure, found to have hepatic flexure colon mass on colonoscopy 09/09/2018. staging evaluation. EXAM: CT CHEST, ABDOMEN, AND PELVIS WITH CONTRAST TECHNIQUE: Multidetector CT imaging of the chest, abdomen and pelvis was performed following the standard protocol during bolus administration of intravenous contrast. CONTRAST:  169mL OMNIPAQUE IOHEXOL 300 MG/ML  SOLN COMPARISON:  08/30/2018 chest radiograph. 01/27/2017 chest CT angiogram. 04/05/2018 CT abdomen/pelvis. FINDINGS: CT CHEST FINDINGS Cardiovascular: Borderline mild cardiomegaly. No significant pericardial effusion/thickening. Left anterior descending coronary atherosclerosis. Atherosclerotic nonaneurysmal thoracic aorta. Dilated main pulmonary artery (3.5 cm diameter). No central pulmonary emboli. Mediastinum/Nodes: No discrete thyroid nodules. Unremarkable esophagus. No axillary adenopathy. Multiple enlarged left prevascular/AP window lymph nodes, largest 1.6 cm (series 3/image 18), new since 01/27/2017 chest CT. New enlarged 2.1 cm subcarinal node (series 3/image 23). No right hilar adenopathy. Lungs/Pleura: No pneumothorax. Trace dependent left pleural effusion. No right pleural effusion. Central perihilar left upper lobe 5.4 x 4.2 cm lung mass (series 3/image 27), new since 01/27/2017 chest CT, with associated high-grade stenosis of the left upper lobe bronchus. Postobstructive consolidation in lingula with some associated volume loss. No significant pulmonary  nodules. Mild passive atelectasis in dependent lower lobes. Musculoskeletal: No aggressive appearing focal osseous lesions. Mild thoracic spondylosis. CT ABDOMEN PELVIS FINDINGS Hepatobiliary: Normal liver size. No liver mass. Normal gallbladder with no radiopaque cholelithiasis. No biliary ductal dilatation. Pancreas: Normal, with no mass or duct dilation. Spleen: Normal size. No mass. Adrenals/Urinary Tract: Left  adrenal 2.4 cm nodule with density 14 HU, stable in size since 04/30/2015 CT, compatible with a benign adenoma. Stable appearance of the right adrenal gland without discrete right adrenal nodule. Punctate nonobstructing interpolar right and upper left renal stones. No hydronephrosis. Subcentimeter hypodense renal cortical lesion in the lower left kidney is too small to characterize and is unchanged, considered benign. No additional renal lesions. Normal bladder. Stomach/Bowel: Small hiatal hernia. Otherwise normal nondistended stomach. Normal caliber small bowel with no small bowel wall thickening. Appendix not discretely visualized. No pericecal inflammatory changes. Oral contrast transits to the right colon. There is an apple-core lesion in the ascending colon measuring 4.0 x 3.9 cm (series 5/image 60) with irregular annular wall thickening. No additional sites of large bowel wall thickening. No significant colonic diverticulosis. No acute pericolonic fat stranding. Moderate diffuse colonic stool volume. Vascular/Lymphatic: Atherosclerotic nonaneurysmal abdominal aorta. Patent portal, splenic, hepatic and renal veins. Top-normal size 0.6 cm right mesenteric node (series 3/image 73). No pathologically enlarged lymph nodes in the abdomen or pelvis. Reproductive: Grossly normal uterus.  No adnexal mass. Other: No pneumoperitoneum, ascites or focal fluid collection. Musculoskeletal: No aggressive appearing focal osseous lesions. Marked lumbar spondylosis. IMPRESSION: 1. Central perihilar left upper lobe 5.4  cm lung mass, new since 01/27/2017 chest CT, compatible with malignancy, favor primary bronchogenic carcinoma. Postobstructive pneumonia/atelectasis in the lingula. 2. Mediastinal adenopathy in the left prevascular/AP window and subcarinal stations, compatible with metastatic nodes (more likely originating from the left upper lobe lung mass). 3. Trace dependent left pleural effusion. 4. Apple-core lesion in the ascending colon, compatible with known primary colonic neoplasm. Adjacent top-normal size right mesenteric lymph node is equivocal for locoregional nodal metastasis. No additional potential findings of metastatic disease in the abdomen or pelvis. 5. Chronic findings include: Aortic Atherosclerosis (ICD10-I70.0). Borderline mild cardiomegaly. One vessel coronary atherosclerosis. Dilated main pulmonary artery, suggesting pulmonary arterial hypertension. Left adrenal adenoma. Punctate bilateral nephrolithiasis. Small hiatal hernia. Electronically Signed   By: Ilona Sorrel M.D.   On: 09/11/2018 16:42   US Venous Img Lower Bilateral  Result Date: 09/08/2018 CLINICAL DATA:  Lower extremity pain EXAM: BILATERAL LOWER EXTREMITY VENOUS DUPLEX ULTRASOUND TECHNIQUE: Doppler venous assessment of the bilateral lower extremity deep venous system was performed, including characterization of spectral flow, compressibility, and phasicity. COMPARISON:  None. FINDINGS: There is complete compressibility of the bilateral common femoral, femoral, and popliteal veins. Doppler analysis demonstrates respiratory phasicity and augmentation of flow with calf compression. No obvious superficial vein or calf vein thrombosis. IMPRESSION: No evidence of lower extremity DVT. Electronically Signed   By: Marybelle Killings M.D.   On: 09/08/2018 13:27   Dg Chest Portable 1 View  Result Date: 08/30/2018 CLINICAL DATA:  Short of breath EXAM: PORTABLE CHEST 1 VIEW COMPARISON:  07/05/2018 FINDINGS: Mild cardiac enlargement. Negative for heart  failure or edema. Mild left lower lobe airspace disease and small left effusion. Right lung base clear. IMPRESSION: Mild left lower lobe atelectasis/infiltrate and small left effusion. Mild progression since the prior study. Electronically Signed   By: Franchot Gallo M.D.   On: 08/30/2018 21:33    ASSESSMENT AND PLAN: 1.  Colorectal adenocarcinoma, non-metastatic  2.  Small cell lung cancer, limited stage (pending brian MRI) 3.  Iron deficiency anemia 4.  Coronary artery disease 5.  Congestive heart failure 6.  Diabetes mellitus 7.  Hypertension 8.  Hypothyroidism  -The patient is okay to discharge from an oncology standpoint.  We have her set up at the cancer center at  [place] on Monday for follow-up. -Renal function remains stable. -The patient has been seen by radiation oncology and will likely receive radiation to her lung mass prior to her second cycle of chemotherapy.  We will coordinate with radiation oncology regarding outpatient follow-up. -We will recheck labs at her appointment on Monday and consider G-CSF.  Mikey Bussing, DNP, AGPCNP-BC, AOCNP 09/22/18   Addendum  I have seen the patient, examined her. I agree with the assessment and and plan and have edited the notes.   Pt is clinically stable, still complains chest congestion, afebrile, VS stable, lab reviewed, Hg improved to 8.5g/dl today after 1 unit blood yesterday. We discussed her f/u after discharge, she prefers to get GCSF at [facility] this Friday or early next week, I will try to set up. She has been seen by rad/onc yesterday, and plan to start concurrent radiation with second cycle chemo on 10/11/2018. I plan to see her back in my office when she comes in for CT simulation on 5/28, and will do a phone visit with her next week.   Truitt Merle  09/22/2018

## 2018-09-22 NOTE — Consult Note (Signed)
Radiation Oncology         [phone] ________________________________  Name: Stephanie Combs        MRN: [id number]  Date of Service: 09/21/18 DOB: [birth year]  CC:[name], [name], MD  No ref. provider found     REFERRING PHYSICIAN: No ref. provider found   DIAGNOSIS: The primary encounter diagnosis was Symptomatic anemia. Diagnoses of Bilateral lower extremity edema, Leg pain, Pain of lower extremity, unspecified laterality, Anemia, Cough, Small cell lung cancer, left upper lobe ([place]), and Malignant neoplasm of hepatic flexure (HCC) were also pertinent to this visit.   HISTORY OF PRESENT ILLNESS: Stephanie Combs is a 68 y.o. female seen at the request of Dr. 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 The patient has a history of DM type 2, CAD, CHF, HTN, hypothyroidism and chronic lymphedema with morbid obesity. She was admitted to [facility] after a 5 day history of shortness of breath and progressive lower extremity edema. She was found to be anemic with an Hgb of 7, and received transfusion. Her stool was heme positive and GI evaluated her and she underwent EGD and colonoscopy on 09/09/2018 revealing a mass in the cecum about 1.5 cm that was biopsied and she also had several polyps throughout the colon that were also biopsied. Her EGD revealed some visible salmon colored epithelium that was noted above the GE junction. A few erosions in the gastric antrum were noted. Biopsies of the stomach had nonspecific reactive gastropathy and parietal cell hyperplasia. The esophagus revealed Barrett's Esophagus without dysplasia. She had tubular adenoma in the descending colon, and rectum, and adenocarcinoma in the hepatic flexure and splenic flexure biopsy. She was ultimately transferred to [facility], and  had a CT CAP on 09/11/2018 revealing  A stable 2.4 cm left adrenal nodule and bilateral renal calculi. There is no visible cecal lesion but an  apple core lesion measuring 4 x 3.9 cm in the ascending colon. In the chest, there was dilatation of the main pulmonary artery to 3.5 cm, adenopathy in the chest with the larges AP window node measuring 1.6 cm. There was a 2.1 cm subcarinal node as well and a perihilar left upper lobe mass measuring 5.4 x 4.2 cm with associated high grade stenosis of the left upper lobe bronchus. Postobstructive changes are noted in the lingula as well. She underwent bronchoscopy on 09/14/2018 with Dr. Loanne Drilling that revealed a small cell carcinoma in the LUL. She began her first cycle of carboplatin/etoposide on Sunday 09/19/2018. Dr. Burr Medico plans to give Neulasta later this week and to resume her next cycle of chemotherapy. She is evaluated to consider chemoRT. She also had an MRI of the brain on 09/20/2018 that was negative for disease. Given her comorbidities she is not a surgical candidate for colectomy, and Dr. Burr Medico plans to treat her small cell lung cancer and requests that she have radiotherapy added to her second cycle of treatment to the chest.   PREVIOUS RADIATION THERAPY: No   PAST MEDICAL HISTORY:  Past Medical History:  Diagnosis Date   Anxiety    CAD (coronary artery disease)    a. s/p DES to LAD and angioplasty to D1 in 05/2017   CHF (congestive heart failure) (HCC)    Diastolic   Chronic back pain    Chronic pain    COPD (chronic obstructive pulmonary disease) ([place])    Degenerative disk disease    Diabetes mellitus without  complication ([place])    History of medication noncompliance 11/2017   "the patient frequently self-adjusts medications without notifying her physicians"   Hypertension    Hypothyroidism    On home O2    2.5 L N/C prn   Pedal edema        PAST SURGICAL HISTORY: Past Surgical History:  Procedure Laterality Date   BIOPSY  09/09/2018   Procedure: BIOPSY;  Surgeon: Daneil Dolin, MD;  [facility];  Service: Endoscopy;;  gastric esophageal hepatic  flexure colon   COLONOSCOPY WITH PROPOFOL N/A 09/09/2018   Procedure: COLONOSCOPY WITH PROPOFOL;  Surgeon: Daneil Dolin, MD;  [facility];  Service: Endoscopy;  Laterality: N/A;   CORONARY STENT INTERVENTION N/A 05/22/2017   Procedure: CORONARY STENT INTERVENTION;  Surgeon: Martinique, Peter M, MD;  Location: [place] [facility];  Service: Cardiovascular;  Laterality: N/A;   ESOPHAGOGASTRODUODENOSCOPY (EGD) WITH PROPOFOL N/A 09/09/2018   Procedure: ESOPHAGOGASTRODUODENOSCOPY (EGD) WITH PROPOFOL;  Surgeon: Daneil Dolin, MD;  [facility];  Service: Endoscopy;  Laterality: N/A;   HERNIA REPAIR     POLYPECTOMY  09/09/2018   Procedure: POLYPECTOMY;  Surgeon: Daneil Dolin, MD;  [facility];  Service: Endoscopy;;  colon   RIGHT/LEFT HEART CATH AND CORONARY ANGIOGRAPHY N/A 05/19/2017   Procedure: RIGHT/LEFT HEART CATH AND CORONARY ANGIOGRAPHY;  Surgeon: Leonie Man, MD;  Location: [place] [facility];  Service: Cardiovascular;  Laterality: N/A;   TUBAL LIGATION     VIDEO BRONCHOSCOPY WITH ENDOBRONCHIAL ULTRASOUND Left 09/14/2018   Procedure: VIDEO BRONCHOSCOPY WITH ENDOBRONCHIAL ULTRASOUND with biopsies;  Surgeon: Margaretha Seeds, MD;  [facility];  Service: Thoracic;  Laterality: Left;     FAMILY HISTORY:  Family History  Problem Relation Age of Onset   Stroke Mother        deceased 1    Heart attack Father        deceased 53    Diabetes Brother    Cerebral palsy Brother    Pneumonia Brother    Diabetes Other    Heart attack Other    Colon cancer Neg Hx      SOCIAL HISTORY:  reports that she quit smoking about 3 years ago. Her smoking use included cigarettes. She has a 67.50 pack-year smoking history. She has never used smokeless tobacco. She reports that she does not drink alcohol or use drugs. The patient is widowed and lives in [place]. She relies on a pastor to take her to appointments, and has a daughter who also is  involved in her care.   ALLERGIES: Dilaudid [hydromorphone hcl]; Midodrine hcl; Actifed cold-allergy [chlorpheniramine-phenylephrine]; Doxycycline; Other; Penicillins; Reglan [metoclopramide]; Valium [diazepam]; and Vistaril [hydroxyzine hcl]   MEDICATIONS:  Current Facility-Administered Medications  Medication Dose Route Frequency Provider Last Rate Last Dose   0.9 %  sodium chloride infusion (Manually program via Guardrails IV Fluids)   Intravenous Once Maryanna Shape, NP       acetaminophen (TYLENOL) tablet 650 mg  650 mg Oral Q6H PRN Hosie Poisson, MD   650 mg at 09/03/18 9381   Or   acetaminophen (TYLENOL) suppository 650 mg  650 mg Rectal Q6H PRN Hosie Poisson, MD       albuterol (PROVENTIL) (2.5 MG/3ML) 0.083% nebulizer solution 3 mL  3 mL Inhalation Q4H PRN Hosie Poisson, MD       allopurinol (ZYLOPRIM) tablet 300 mg  300 mg Oral Daily Magrinat, Virgie Dad, MD  300 mg at 09/21/18 0911   ALPRAZolam (XANAX) tablet 1 mg  1 mg Oral TID PRN Tawni Millers, MD   1 mg at 09/22/18 1610   alteplase (CATHFLO ACTIVASE) injection 2 mg  2 mg Intracatheter Once PRN Truitt Merle, MD       aspirin EC tablet 81 mg  81 mg Oral Daily Hosie Poisson, MD   81 mg at 09/21/18 0911   atorvastatin (LIPITOR) tablet 80 mg  80 mg Oral q morning - 10a Hosie Poisson, MD   80 mg at 09/21/18 0910   benzonatate (TESSALON) capsule 200 mg  200 mg Oral TID PRN Hosie Poisson, MD       bisacodyl (DULCOLAX) EC tablet 10 mg  10 mg Oral Daily PRN Hosie Poisson, MD   10 mg at 09/06/18 1121   cholecalciferol (VITAMIN D3) tablet 5,000 Units  5,000 Units Oral Daily Hosie Poisson, MD   5,000 Units at 09/21/18 0930   furosemide (LASIX) tablet 40 mg  40 mg Oral BID Tawni Millers, MD   40 mg at 09/22/18 9604   gabapentin (NEURONTIN) capsule 300 mg  300 mg Oral QID Hosie Poisson, MD   300 mg at 09/21/18 2149   Gerhardt's butt cream 1 application  1 application Topical TID Hosie Poisson, MD   1  application at 54/09/81 0913   guaiFENesin-dextromethorphan (ROBITUSSIN DM) 100-10 MG/5ML syrup 5 mL  5 mL Oral Q4H PRN Arrien, Jimmy Picket, MD   5 mL at 09/21/18 2156   heparin lock flush 100 unit/mL  500 Units Intracatheter Once PRN Truitt Merle, MD       heparin lock flush 100 unit/mL  250 Units Intracatheter Once PRN Truitt Merle, MD       insulin aspart (novoLOG) injection 0-15 Units  0-15 Units Subcutaneous TID WC Hosie Poisson, MD   5 Units at 09/21/18 1731   insulin aspart (novoLOG) injection 0-5 Units  0-5 Units Subcutaneous QHS Hosie Poisson, MD   2 Units at 09/21/18 2149   insulin aspart (novoLOG) injection 8 Units  8 Units Subcutaneous TID WC Arrien, Jimmy Picket, MD   8 Units at 09/22/18 0809   insulin detemir (LEVEMIR) injection 36 Units  36 Units Subcutaneous QHS Tawni Millers, MD   36 Units at 09/21/18 2149   lactated ringers infusion   Intravenous Continuous Hosie Poisson, MD 10 mL/hr at 09/14/18 1236     levothyroxine (SYNTHROID) tablet 200 mcg  200 mcg Oral QAC breakfast Hosie Poisson, MD   200 mcg at 09/22/18 0555   morphine 2 MG/ML injection 2 mg  2 mg Intravenous Q3H PRN Arrien, Jimmy Picket, MD       nitroGLYCERIN (NITROSTAT) SL tablet 0.4 mg  0.4 mg Sublingual Q5 min PRN Hosie Poisson, MD   0.4 mg at 09/19/18 0946   nystatin (MYCOSTATIN/NYSTOP) topical powder   Topical BID Hosie Poisson, MD       omega-3 acid ethyl esters (LOVAZA) capsule 1 g  1 g Oral Daily Hosie Poisson, MD   1 g at 09/21/18 0911   ondansetron (ZOFRAN) 8 mg in sodium chloride 0.9 % 50 mL IVPB  8 mg Intravenous Q8H Magrinat, Virgie Dad, MD 216 mL/hr at 09/22/18 0557 8 mg at 09/22/18 0557   ondansetron (ZOFRAN) tablet 8 mg  8 mg Oral Q8H PRN Hosie Poisson, MD   8 mg at 09/18/18 2259   oxyCODONE-acetaminophen (PERCOCET/ROXICET) 5-325 MG per tablet 1 tablet  1 tablet Oral Q6H PRN Hosie Poisson,  MD   1 tablet at 09/21/18 2327   And   oxyCODONE (Oxy IR/ROXICODONE) immediate release  tablet 5 mg  5 mg Oral Q6H PRN Hosie Poisson, MD   5 mg at 09/21/18 2327   pantoprazole (PROTONIX) EC tablet 40 mg  40 mg Oral BID Hosie Poisson, MD   40 mg at 09/21/18 2149   polyethylene glycol (MIRALAX / GLYCOLAX) packet 17 g  17 g Oral BID Hosie Poisson, MD   17 g at 09/21/18 2148   prochlorperazine (COMPAZINE) injection 5 mg  5 mg Intravenous Q6H PRN Magrinat, Virgie Dad, MD       senna (SENOKOT) tablet 17.2 mg  2 tablet Oral BID WC Hosie Poisson, MD   17.2 mg at 09/22/18 9326   sodium chloride flush (NS) 0.9 % injection 10 mL  10 mL Intracatheter PRN Truitt Merle, MD       sodium chloride flush (NS) 0.9 % injection 3 mL  3 mL Intracatheter PRN Truitt Merle, MD       tiZANidine (ZANAFLEX) tablet 2 mg  2 mg Oral Q8H PRN Hosie Poisson, MD   2 mg at 09/21/18 0249   topiramate (TOPAMAX) tablet 150 mg  150 mg Oral QPM Hosie Poisson, MD   150 mg at 09/21/18 1706   umeclidinium-vilanterol (ANORO ELLIPTA) 62.5-25 MCG/INH 1 puff  1 puff Inhalation Daily Hosie Poisson, MD   1 puff at 09/22/18 7124     REVIEW OF SYSTEMS: On review of systems, the patient reports that she is doing okay. She has weight fluctuations from her CHF and reports she can fluctuate 20+ pounds. She denies any progressive weight loss unrelated. She reports she has been hoarse for many years and attributes this to her CHF as well. She denies any SOB at rest, but does have this with exertion. She denies any abdominal pain, nausea, and no other complaints are verbalized.      PHYSICAL EXAM:  Wt Readings from Last 3 Encounters:  09/19/18 (!) 316 lb 9.3 oz (143.6 kg)  08/25/18 (!) 305 lb (138.3 kg)  08/05/18 (!) 311 lb (141.1 kg)   Temp Readings from Last 3 Encounters:  09/22/18 98 F (36.7 C) (Oral)  08/05/18 98 F (36.7 C) (Oral)  07/08/18 98.3 F (36.8 C) (Oral)   BP Readings from Last 3 Encounters:  09/22/18 (!) 112/57  08/25/18 110/62  08/05/18 (!) 127/51   Pulse Readings from Last 3 Encounters:  09/22/18 72   08/25/18 70  08/05/18 66   Pain Assessment Pain Score: 7  Pain Frequency: Constant/10  Unable to assess due to nature of discussion.   ECOG = 2  0 - Asymptomatic (Fully active, able to carry on all predisease activities without restriction)  1 - Symptomatic but completely ambulatory (Restricted in physically strenuous activity but ambulatory and able to carry out work of a light or sedentary nature. For example, light housework, office work)  2 - Symptomatic, <50% in bed during the day (Ambulatory and capable of all self care but unable to carry out any work activities. Up and about more than 50% of waking hours)  3 - Symptomatic, >50% in bed, but not bedbound (Capable of only limited self-care, confined to bed or chair 50% or more of waking hours)  4 - Bedbound (Completely disabled. Cannot carry on any self-care. Totally confined to bed or chair)  5 - Death   Eustace Pen MM, Creech RH, Tormey DC, et al. [id number]). "Toxicity and response criteria of the [place]  Cooperative Oncology Group". [place] Oncol. 5 (6): 649-55    LABORATORY DATA:  Lab Results  Component Value Date   WBC 6.2 09/21/2018   HGB 7.1 (L) 09/21/2018   HCT 24.2 (L) 09/21/2018   MCV 90.6 09/21/2018   PLT 238 09/21/2018   Lab Results  Component Value Date   NA 134 (L) 09/22/2018   K 4.0 09/22/2018   CL 101 09/22/2018   CO2 27 09/22/2018   Lab Results  Component Value Date   ALT 12 09/22/2018   AST 15 09/22/2018   ALKPHOS 75 09/22/2018   BILITOT 0.6 09/22/2018      RADIOGRAPHY: Dg Chest 2 View  Result Date: 09/17/2018 CLINICAL DATA:  Recent onset of cough. EXAM: CHEST - 2 VIEW COMPARISON:  Chest CT from 6 days ago FINDINGS: Left hilar mass with left upper lobe collapse, new. Normal heart size when accounting for rotation and low volumes. IMPRESSION: Collapse of the left upper lobe in this patient with known hilar mass. Electronically Signed   By: Monte Fantasia M.D.   On: 09/17/2018 11:18   Ct Chest  W Contrast  Result Date: 09/11/2018 CLINICAL DATA:  68 year old female inpatient admitted with symptomatic anemia and decompensated heart failure, found to have hepatic flexure colon mass on colonoscopy 09/09/2018. staging evaluation. EXAM: CT CHEST, ABDOMEN, AND PELVIS WITH CONTRAST TECHNIQUE: Multidetector CT imaging of the chest, abdomen and pelvis was performed following the standard protocol during bolus administration of intravenous contrast. CONTRAST:  156mL OMNIPAQUE IOHEXOL 300 MG/ML  SOLN COMPARISON:  08/30/2018 chest radiograph. 01/27/2017 chest CT angiogram. 04/05/2018 CT abdomen/pelvis. FINDINGS: CT CHEST FINDINGS Cardiovascular: Borderline mild cardiomegaly. No significant pericardial effusion/thickening. Left anterior descending coronary atherosclerosis. Atherosclerotic nonaneurysmal thoracic aorta. Dilated main pulmonary artery (3.5 cm diameter). No central pulmonary emboli. Mediastinum/Nodes: No discrete thyroid nodules. Unremarkable esophagus. No axillary adenopathy. Multiple enlarged left prevascular/AP window lymph nodes, largest 1.6 cm (series 3/image 18), new since 01/27/2017 chest CT. New enlarged 2.1 cm subcarinal node (series 3/image 23). No right hilar adenopathy. Lungs/Pleura: No pneumothorax. Trace dependent left pleural effusion. No right pleural effusion. Central perihilar left upper lobe 5.4 x 4.2 cm lung mass (series 3/image 27), new since 01/27/2017 chest CT, with associated high-grade stenosis of the left upper lobe bronchus. Postobstructive consolidation in lingula with some associated volume loss. No significant pulmonary nodules. Mild passive atelectasis in dependent lower lobes. Musculoskeletal: No aggressive appearing focal osseous lesions. Mild thoracic spondylosis. CT ABDOMEN PELVIS FINDINGS Hepatobiliary: Normal liver size. No liver mass. Normal gallbladder with no radiopaque cholelithiasis. No biliary ductal dilatation. Pancreas: Normal, with no mass or duct dilation.  Spleen: Normal size. No mass. Adrenals/Urinary Tract: Left adrenal 2.4 cm nodule with density 14 HU, stable in size since 04/30/2015 CT, compatible with a benign adenoma. Stable appearance of the right adrenal gland without discrete right adrenal nodule. Punctate nonobstructing interpolar right and upper left renal stones. No hydronephrosis. Subcentimeter hypodense renal cortical lesion in the lower left kidney is too small to characterize and is unchanged, considered benign. No additional renal lesions. Normal bladder. Stomach/Bowel: Small hiatal hernia. Otherwise normal nondistended stomach. Normal caliber small bowel with no small bowel wall thickening. Appendix not discretely visualized. No pericecal inflammatory changes. Oral contrast transits to the right colon. There is an apple-core lesion in the ascending colon measuring 4.0 x 3.9 cm (series 5/image 60) with irregular annular wall thickening. No additional sites of large bowel wall thickening. No significant colonic diverticulosis. No acute pericolonic fat stranding. Moderate diffuse  colonic stool volume. Vascular/Lymphatic: Atherosclerotic nonaneurysmal abdominal aorta. Patent portal, splenic, hepatic and renal veins. Top-normal size 0.6 cm right mesenteric node (series 3/image 73). No pathologically enlarged lymph nodes in the abdomen or pelvis. Reproductive: Grossly normal uterus.  No adnexal mass. Other: No pneumoperitoneum, ascites or focal fluid collection. Musculoskeletal: No aggressive appearing focal osseous lesions. Marked lumbar spondylosis. IMPRESSION: 1. Central perihilar left upper lobe 5.4 cm lung mass, new since 01/27/2017 chest CT, compatible with malignancy, favor primary bronchogenic carcinoma. Postobstructive pneumonia/atelectasis in the lingula. 2. Mediastinal adenopathy in the left prevascular/AP window and subcarinal stations, compatible with metastatic nodes (more likely originating from the left upper lobe lung mass). 3. Trace  dependent left pleural effusion. 4. Apple-core lesion in the ascending colon, compatible with known primary colonic neoplasm. Adjacent top-normal size right mesenteric lymph node is equivocal for locoregional nodal metastasis. No additional potential findings of metastatic disease in the abdomen or pelvis. 5. Chronic findings include: Aortic Atherosclerosis (ICD10-I70.0). Borderline mild cardiomegaly. One vessel coronary atherosclerosis. Dilated main pulmonary artery, suggesting pulmonary arterial hypertension. Left adrenal adenoma. Punctate bilateral nephrolithiasis. Small hiatal hernia. Electronically Signed   By: Ilona Sorrel M.D.   On: 09/11/2018 16:42   Mr Jeri Cos VV Contrast  Result Date: 09/20/2018 CLINICAL DATA:  Small-cell lung cancer staging. EXAM: MRI HEAD WITHOUT AND WITH CONTRAST TECHNIQUE: Multiplanar, multiecho pulse sequences of the brain and surrounding structures were obtained without and with intravenous contrast. CONTRAST:  10 mL Gadavist COMPARISON:  Head CT 01/06/2018 FINDINGS: Some sequences are moderately to severely motion degraded despite repeated imaging attempts. Brain: There is no evidence of acute infarct, intracranial hemorrhage, mass, midline shift, or extra-axial fluid collection. Patchy to confluent T2 hyperintensities in the cerebral white matter and pons are nonspecific but compatible with moderate to severe chronic small vessel ischemic disease, mildly progressed from the prior MRI. There is mild cerebral atrophy. No enhancing brain lesions are identified, however sensitivity for detection of small lesions is reduced due to the degree of motion artifact. Vascular: Major intracranial vascular flow voids are preserved with the left vertebral artery being dominant. Skull and upper cervical spine: Diffuse heterogeneity in the bone marrow of the skull with hyperostosis frontalis noted. A few small foci of signal are noted in the skull on diffusion imaging which are new from the  prior study with the largest measuring 1 cm in the lateral right frontal region and demonstrating apparent enhancement (series 3, image 38 and series 12, image 79). No destructive skull lesion is identified. Sinuses/Orbits: Unremarkable orbits. Clear paranasal sinuses. Small right mastoid effusion. Other: None. IMPRESSION: 1. Motion degraded examination without brain metastases identified. 2. Small skull metastases not excluded. 3. Moderate to severe chronic small vessel ischemic disease. Electronically Signed   By: Logan Bores M.D.   On: 09/20/2018 18:56   Ct Abdomen Pelvis W Contrast  Result Date: 09/11/2018 CLINICAL DATA:  68 year old female inpatient admitted with symptomatic anemia and decompensated heart failure, found to have hepatic flexure colon mass on colonoscopy 09/09/2018. staging evaluation. EXAM: CT CHEST, ABDOMEN, AND PELVIS WITH CONTRAST TECHNIQUE: Multidetector CT imaging of the chest, abdomen and pelvis was performed following the standard protocol during bolus administration of intravenous contrast. CONTRAST:  11mL OMNIPAQUE IOHEXOL 300 MG/ML  SOLN COMPARISON:  08/30/2018 chest radiograph. 01/27/2017 chest CT angiogram. 04/05/2018 CT abdomen/pelvis. FINDINGS: CT CHEST FINDINGS Cardiovascular: Borderline mild cardiomegaly. No significant pericardial effusion/thickening. Left anterior descending coronary atherosclerosis. Atherosclerotic nonaneurysmal thoracic aorta. Dilated main pulmonary artery (3.5 cm diameter). No central pulmonary  emboli. Mediastinum/Nodes: No discrete thyroid nodules. Unremarkable esophagus. No axillary adenopathy. Multiple enlarged left prevascular/AP window lymph nodes, largest 1.6 cm (series 3/image 18), new since 01/27/2017 chest CT. New enlarged 2.1 cm subcarinal node (series 3/image 23). No right hilar adenopathy. Lungs/Pleura: No pneumothorax. Trace dependent left pleural effusion. No right pleural effusion. Central perihilar left upper lobe 5.4 x 4.2 cm lung mass  (series 3/image 27), new since 01/27/2017 chest CT, with associated high-grade stenosis of the left upper lobe bronchus. Postobstructive consolidation in lingula with some associated volume loss. No significant pulmonary nodules. Mild passive atelectasis in dependent lower lobes. Musculoskeletal: No aggressive appearing focal osseous lesions. Mild thoracic spondylosis. CT ABDOMEN PELVIS FINDINGS Hepatobiliary: Normal liver size. No liver mass. Normal gallbladder with no radiopaque cholelithiasis. No biliary ductal dilatation. Pancreas: Normal, with no mass or duct dilation. Spleen: Normal size. No mass. Adrenals/Urinary Tract: Left adrenal 2.4 cm nodule with density 14 HU, stable in size since 04/30/2015 CT, compatible with a benign adenoma. Stable appearance of the right adrenal gland without discrete right adrenal nodule. Punctate nonobstructing interpolar right and upper left renal stones. No hydronephrosis. Subcentimeter hypodense renal cortical lesion in the lower left kidney is too small to characterize and is unchanged, considered benign. No additional renal lesions. Normal bladder. Stomach/Bowel: Small hiatal hernia. Otherwise normal nondistended stomach. Normal caliber small bowel with no small bowel wall thickening. Appendix not discretely visualized. No pericecal inflammatory changes. Oral contrast transits to the right colon. There is an apple-core lesion in the ascending colon measuring 4.0 x 3.9 cm (series 5/image 60) with irregular annular wall thickening. No additional sites of large bowel wall thickening. No significant colonic diverticulosis. No acute pericolonic fat stranding. Moderate diffuse colonic stool volume. Vascular/Lymphatic: Atherosclerotic nonaneurysmal abdominal aorta. Patent portal, splenic, hepatic and renal veins. Top-normal size 0.6 cm right mesenteric node (series 3/image 73). No pathologically enlarged lymph nodes in the abdomen or pelvis. Reproductive: Grossly normal uterus.  No  adnexal mass. Other: No pneumoperitoneum, ascites or focal fluid collection. Musculoskeletal: No aggressive appearing focal osseous lesions. Marked lumbar spondylosis. IMPRESSION: 1. Central perihilar left upper lobe 5.4 cm lung mass, new since 01/27/2017 chest CT, compatible with malignancy, favor primary bronchogenic carcinoma. Postobstructive pneumonia/atelectasis in the lingula. 2. Mediastinal adenopathy in the left prevascular/AP window and subcarinal stations, compatible with metastatic nodes (more likely originating from the left upper lobe lung mass). 3. Trace dependent left pleural effusion. 4. Apple-core lesion in the ascending colon, compatible with known primary colonic neoplasm. Adjacent top-normal size right mesenteric lymph node is equivocal for locoregional nodal metastasis. No additional potential findings of metastatic disease in the abdomen or pelvis. 5. Chronic findings include: Aortic Atherosclerosis (ICD10-I70.0). Borderline mild cardiomegaly. One vessel coronary atherosclerosis. Dilated main pulmonary artery, suggesting pulmonary arterial hypertension. Left adrenal adenoma. Punctate bilateral nephrolithiasis. Small hiatal hernia. Electronically Signed   By: Ilona Sorrel M.D.   On: 09/11/2018 16:42   US Venous Img Lower Bilateral  Result Date: 09/08/2018 CLINICAL DATA:  Lower extremity pain EXAM: BILATERAL LOWER EXTREMITY VENOUS DUPLEX ULTRASOUND TECHNIQUE: Doppler venous assessment of the bilateral lower extremity deep venous system was performed, including characterization of spectral flow, compressibility, and phasicity. COMPARISON:  None. FINDINGS: There is complete compressibility of the bilateral common femoral, femoral, and popliteal veins. Doppler analysis demonstrates respiratory phasicity and augmentation of flow with calf compression. No obvious superficial vein or calf vein thrombosis. IMPRESSION: No evidence of lower extremity DVT. Electronically Signed   By: Marybelle Killings M.D.    On:  09/08/2018 13:27   Dg Chest Portable 1 View  Result Date: 08/30/2018 CLINICAL DATA:  Short of breath EXAM: PORTABLE CHEST 1 VIEW COMPARISON:  07/05/2018 FINDINGS: Mild cardiac enlargement. Negative for heart failure or edema. Mild left lower lobe airspace disease and small left effusion. Right lung base clear. IMPRESSION: Mild left lower lobe atelectasis/infiltrate and small left effusion. Mild progression since the prior study. Electronically Signed   By: Franchot Gallo M.D.   On: 08/30/2018 21:33       IMPRESSION/PLAN: 1. 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 As above, the patient started her first cycle of carboplatin/etoposide. She will continue with Dr. Burr Medico and receive Neulasta later this week. We will plan to have the patient begin radiotherapy along with her chemotherapy during her 2nd cycle of treatment. I spent time with the patient by phone discussing her case, and the rationale to use radiotherapy in the curative setting. We discussed risks, benefits, short, and long term effects of radiotherapy and the patient desires her treatment in [name]. We will plan to simulate prior to her second cycle of treatment.  2. Transportation. During our discussion, the patient verbalizes that she has a pastor who helps her with transportation but may need additional resources as well. I've placed a referral for CM.  3. PCI. The patient's recent MRI was negative, but I discussed with her the concerns that small cell cancer patients have up to a 60% risk of CNS disease, and we can reduce that risk to 20% with prophylactic cranial irradiation. We will discuss this further at a later time as we would not proceed with this until completion of her chemotherapy. with her colon cancer, the timing of offering PCI is to be determined. 4. 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 We will follow along regarding management, however her primary therapy will be directed to the  lung cancer.  5. CHF, CAD. She will follow up with her cardiologist regarding recommendations moving forward.    In a visit lasting 70 minutes, greater than 50% of the time was spent discussing her case, and in floor time, coordinating the patient's care.    Carola Rhine, PAC

## 2018-09-22 NOTE — Progress Notes (Signed)
PROGRESS NOTE    Stephanie Combs  YHC:[id number] DOB: 1950/08/08 DOA: 08/30/2018 PCP: [name], MD    Brief Narrative:  68 year old female who presented with dyspnea and lower extremity edema. Does have significant past medical history for coronary artery disease, diastolic heart failure, type 2 diabetes mellitus, chronic kidney disease stage III, hypertension, hypothyroidism and chronic lymphedema. She reported 5 days of worsening dyspnea along with lower extremity edema and dry cough. Symptoms were refractory to increased dose of oral furosemide. On her initial physical examination she was afebrile, pulse rate 84, respirate 24, blood pressure 130/100, oxygenation 100% on supplemental oxygen. Her lungs are clear to auscultation bilaterally, heart S1-S2 present, rhythmic, no gallops, rubs or murmurs, the abdomen was protuberant, nontender, no lower extremity edema. Sodium 138, potassium 4.2, chloride 102, bicarb 27, glucose 241, BUN 23, creatinine 1.58, AST 13, ALT 11, iron 9, TIBC 431, transferrin saturation 2, ferritin 4, white count 7.7, hemoglobin 7.0, hematocrit 35.0, platelets 377.  Patient was admitted to the hospital with a working diagnosis of symptomatic anemia and decompensated heart failure.  Patient received 2 units packed red blood cells with improvement of her hemoglobin and hematocrit.  Patient was successfully diuresed with IV furosemide, with improvement of her symptoms.  Further work-up with colonoscopy April 30 showed a colonic mass. [facility].  Patient was found to have a large left hilar mass, consulted pulmonary and patient underwent bronchoscopy plus biopsy.  Diagnosed with synchronous lung and colon cancer, transfered to [facility] for chemotherapy and brain MRI.   Assessment & Plan:   Principal Problem:   Anemia Active Problems:   DM type 2 (diabetes mellitus, type 2) (HCC)   HTN (hypertension),  benign   CKD (chronic kidney disease), stage III (HCC)   Hypothyroidism   Bilateral lower extremity edema   Uncontrolled type 2 diabetes mellitus with stage 3 chronic kidney disease (HCC)   Obesity, Class III, BMI 40-49.9 (morbid obesity) (HCC)   CAD (coronary artery disease)   Symptomatic anemia   Acute on chronic diastolic CHF (congestive heart failure) (HCC)   Heme positive stool   Non-intractable vomiting   Abdominal pain, epigastric   Pain of lower extremity   Colonic mass   Colon polyps   Cellulitis of leg, left   Malignant neoplasm of hepatic flexure (HCC)   Small cell lung cancer, left upper lobe ([place])  1. Synchronoussmall cell lung cancerand colonic adenocarcinomaon chemotherapy, complicated by hyperuricemia and hyperfostatemia.On day 4 of 1 cycle of chemotherapy, tolerating well, no nausea or vomiting, no dyspnea.  MRI brain which was done negative for any brain metastases, small skull metastases not excluded. Her uric acid is down to 1,3. Personally reviewed EKG with no active ischemic changes.   Outpatient follow-up with oncology.  2. Symptomatic iron deficiency anemia due to coloniccancer/ adenocarcinoma.Severe iron deficiency(serum iron at 9.0, TIBC at 413, ferritin 4 and transferrin saturation of 2). Patient has received 2 units prbc transfusion and IV iron infusionx1. We will repeat H&H this morning.  Patient with no overt bleeding.  Patient noted to not be a candidate for surgical intervention.  Patient tolerating oral intake.  No nausea or vomiting.   3. Decompensated diastolic heart failure, acute on chronic.EF 60 to 65% with moderately increased LV wall thickness. Improved with IV diuretics.  Patient has been transitioned to oral Lasix 40 mg twice daily.  Repeat chest x-ray today as patient with some complaints of shortness of breath.  Clinically improved however still with some persistent edema in lower extremities may have a component of venous  insufficiency.  Outpatient follow-up.   3.Left leg cellulitis. Resolved clinically.   4. Morbid obesity and dyslipidemia.BMI is50.5, in risk for medical complications, will need follow up as outpatient.   5. T2DM.Uncontrolled hyperglycemia.  CBG of 118 this morning.  Continue current regimen of basal lnsulin levimirto 36 units dailyplus8 units pre-meal, and sliding scale insulin, for glucose cover and monitoring.Tolerating po well.  6. Hypothyroid. Continue home regimen Synthroid.     7. AKI on CKD stage 3.Renal function with serum cr at 1,52 with K at 4,2 and serum bicarbonate at 27. Persistent hypervolemia, challenging physical volume assessment due to large BMI.  Renal function improved with diuresis was 1.46 by day of discharge.  Outpatient follow-up.    8.  Shortness of breath/fatigue On day of discharge patient with complaints of shortness of breath and fatigue.  Patient concerned that hemoglobin may have dropped and concerned about discharge.  Shortness of breath and fatigue likely secondary to small cell lung cancer and acute on chronic CHF exacerbation.  Patient however speaking in full sentences.  We will get a H&H.  Check a chest x-ray.  And if chest x-ray is unremarkable and H&H is stable patient will be discharged home with outpatient follow-up with oncology.    DVT prophylaxis: SCDs Code Status: DNR Family Communication: Updated patient.  No family at bedside. Disposition Plan: Discharge home with home health.   Consultants:   Cardiology Dr. Domenic Polite 09/10/2018  Gastroenterology: Dr. Oneida Alar 09/07/2018  General surgery: Dr.Tsuei 09/10/2018  Palliative care: Jobe Gibbon, NP 09/13/2018  Pulmonary and critical care Dr. Loanne Drilling 09/13/2018  Oncology: Dr. Burr Medico 09/15/2018  Radiation oncology: Worthy Flank, PA 09/22/2018  Procedures:   Colonoscopy with biopsy   Bronchoscopy with biopsy  CT chest, abdomen and pelvis 09/11/2018  MRI brain  09/20/2018  Lower extremity Dopplers 09/08/2018    Antimicrobials:      Subjective: Patient with complaints of generalized fatigue and shortness of breath on minimal exertion feels hemoglobin may be low.  Patient also with multiple complaints about her prescriptions for discharge.  No chest pain.  No abdominal pain.  Objective: Vitals:   09/21/18 2120 09/22/18 0141 09/22/18 0201 09/22/18 0552  BP: 133/62 115/70 (!) 114/50 (!) 112/57  Pulse: 74 61 75 72  Resp: 17 16 14 16   Temp: 98.3 F (36.8 C) 98.5 F (36.9 C) 98.4 F (36.9 C) 98 F (36.7 C)  TempSrc: Oral Oral Oral Oral  SpO2: 99% 100% 100% 99%  Weight:      Height:        Intake/Output Summary (Last 24 hours) at 09/22/2018 1107 Last data filed at 09/22/2018 1043 Gross per 24 hour  Intake 2195 ml  Output -  Net 2195 ml   Filed Weights   09/17/18 0608 09/18/18 0406 09/19/18 0651  Weight: (!) 139.1 kg (!) 142 kg (!) 143.6 kg    Examination:  General exam: Appears calm and comfortable  Respiratory system: Clear to auscultation. Respiratory effort normal. Cardiovascular system: S1 & S2 heard, RRR. No JVD, murmurs, rubs, gallops or clicks. No pedal edema. Gastrointestinal system: Abdomen is nondistended, soft and nontender. No organomegaly or masses felt. Normal bowel sounds heard. Central nervous system: Alert and oriented. No focal neurological deficits. Extremities: Symmetric 5 x 5 power. Skin: No rashes, lesions or ulcers Psychiatry: Judgement and insight appear normal. Mood & affect appropriate.     Data Reviewed:  I have personally reviewed following labs and imaging studies  CBC: Recent Labs  Lab 09/17/18 0855 09/18/18 0602 09/21/18 0530  WBC 7.5 7.3 6.2  NEUTROABS  --  4.1 4.8  HGB 7.5* 8.0* 7.1*  HCT 25.5* 29.0* 24.2*  MCV 87.9 91.2 90.6  PLT 269 281 226   Basic Metabolic Panel: Recent Labs  Lab 09/18/18 0602 09/19/18 0900 09/20/18 0531 09/21/18 0530 09/22/18 0639  NA 135 135 136 135  134*  K 4.3 4.3 4.2 4.4 4.0  CL 97* 97* 101 101 101  CO2 28 28 27 25 27   GLUCOSE 317* 292* 227* 251* 131*  BUN 41* 46* 53* 53* 59*  CREATININE 1.34* 1.28* 1.52* 1.31* 1.46*  CALCIUM 8.6* 8.6* 8.5* 8.3* 8.0*  MG  --   --  2.2 2.0 2.1  PHOS  --  4.7* 5.0* 4.7* 5.2*   GFR: Estimated Creatinine Clearance: 54.1 mL/min (A) (by C-G formula based on SCr of 1.46 mg/dL (H)). Liver Function Tests: Recent Labs  Lab 09/18/18 0602 09/19/18 0900 09/20/18 0531 09/21/18 0530 09/22/18 0639  AST 13* <5* 12* 13* 15  ALT 10 10 10 10 12   ALKPHOS 97 91 82 76 75  BILITOT 0.6 0.6 0.3 0.6 0.6  PROT 6.1* 5.9* 5.6* 5.6* 5.6*  ALBUMIN 3.2* 3.0* 3.0* 2.9* 3.0*   No results for input(s): LIPASE, AMYLASE in the last 168 hours. No results for input(s): AMMONIA in the last 168 hours. Coagulation Profile: No results for input(s): INR, PROTIME in the last 168 hours. Cardiac Enzymes: No results for input(s): CKTOTAL, CKMB, CKMBINDEX, TROPONINI in the last 168 hours. BNP (last 3 results) No results for input(s): PROBNP in the last 8760 hours. HbA1C: No results for input(s): HGBA1C in the last 72 hours. CBG: Recent Labs  Lab 09/21/18 0748 09/21/18 1155 09/21/18 1707 09/21/18 2124 09/22/18 0729  GLUCAP 216* 210* 207* 206* 118*   Lipid Profile: No results for input(s): CHOL, HDL, LDLCALC, TRIG, CHOLHDL, LDLDIRECT in the last 72 hours. Thyroid Function Tests: No results for input(s): TSH, T4TOTAL, FREET4, T3FREE, THYROIDAB in the last 72 hours. Anemia Panel: No results for input(s): VITAMINB12, FOLATE, FERRITIN, TIBC, IRON, RETICCTPCT in the last 72 hours. Sepsis Labs: No results for input(s): PROCALCITON, LATICACIDVEN in the last 168 hours.  Recent Results (from the past 240 hour(s))  Fungus Culture With Stain     Status: None (Preliminary result)   Collection Time: 09/14/18  1:37 PM  Result Value Ref Range Status   Fungus Stain Final report  Final    Comment: (NOTE) Performed At: [facility] [place], [name] [id number] Rush Farmer MD [id number]    Fungus (Mycology) Culture PENDING  Incomplete   Fungal Source BRONCHIAL ALVEOLAR LAVAGE  Final    Comment: Performed at [facility], [place] [address], [place], [place] [address]  Culture, respiratory     Status: None   Collection Time: 09/14/18  1:37 PM  Result Value Ref Range Status   Specimen Description BRONCHIAL ALVEOLAR LAVAGE  Final   Special Requests NONE  Final   Gram Stain   Final    FEW WBC PRESENT,BOTH PMN AND MONONUCLEAR NO ORGANISMS SEEN    Culture   Final    NO GROWTH 2 DAYS Performed at [facility], [place] [address], [place], [place] [address]    Report Status 09/17/2018 FINAL  Final  Acid Fast Smear (AFB)     Status: None   Collection Time: 09/14/18  1:37 PM  Result Value Ref Range Status   AFB Specimen Processing Concentration  Final   Acid Fast Smear Negative  Final    Comment: (NOTE) Performed At: [facility] [place], [name] [id number] Rush Farmer MD [id number]    Source (AFB) BRONCHIAL ALVEOLAR LAVAGE  Final    Comment: Performed at [facility], [place] [address], [place], [place] [address]  Fungus Culture Result     Status: None   Collection Time: 09/14/18  1:37 PM  Result Value Ref Range Status   Result 1 Comment  Final    Comment: (NOTE) KOH/Calcofluor preparation:  no fungus observed. Performed At: [facility] [place], [name] [id number] Rush Farmer MD [id number]          Radiology Studies: Mr Jeri Cos JI Contrast  Result Date: 09/20/2018 CLINICAL DATA:  Small-cell lung cancer staging. EXAM: MRI HEAD WITHOUT AND WITH CONTRAST TECHNIQUE: Multiplanar, multiecho pulse sequences of the brain and surrounding structures were obtained without and with intravenous contrast. CONTRAST:  10 mL Gadavist COMPARISON:  Head CT 01/06/2018 FINDINGS: Some sequences are moderately to severely motion  degraded despite repeated imaging attempts. Brain: There is no evidence of acute infarct, intracranial hemorrhage, mass, midline shift, or extra-axial fluid collection. Patchy to confluent T2 hyperintensities in the cerebral white matter and pons are nonspecific but compatible with moderate to severe chronic small vessel ischemic disease, mildly progressed from the prior MRI. There is mild cerebral atrophy. No enhancing brain lesions are identified, however sensitivity for detection of small lesions is reduced due to the degree of motion artifact. Vascular: Major intracranial vascular flow voids are preserved with the left vertebral artery being dominant. Skull and upper cervical spine: Diffuse heterogeneity in the bone marrow of the skull with hyperostosis frontalis noted. A few small foci of signal are noted in the skull on diffusion imaging which are new from the prior study with the largest measuring 1 cm in the lateral right frontal region and demonstrating apparent enhancement (series 3, image 38 and series 12, image 79). No destructive skull lesion is identified. Sinuses/Orbits: Unremarkable orbits. Clear paranasal sinuses. Small right mastoid effusion. Other: None. IMPRESSION: 1. Motion degraded examination without brain metastases identified. 2. Small skull metastases not excluded. 3. Moderate to severe chronic small vessel ischemic disease. Electronically Signed   By: Logan Bores M.D.   On: 09/20/2018 18:56        Scheduled Meds: . sodium chloride   Intravenous Once  . allopurinol  300 mg Oral Daily  . aspirin EC  81 mg Oral Daily  . atorvastatin  80 mg Oral q morning - 10a  . cholecalciferol  5,000 Units Oral Daily  . furosemide  40 mg Oral BID  . gabapentin  300 mg Oral QID  . Gerhardt's butt cream  1 application Topical TID  . insulin aspart  0-15 Units Subcutaneous TID WC  . insulin aspart  0-5 Units Subcutaneous QHS  . insulin aspart  8 Units Subcutaneous TID WC  . insulin detemir   36 Units Subcutaneous QHS  . levothyroxine  200 mcg Oral QAC breakfast  . nystatin   Topical BID  . omega-3 acid ethyl esters  1 g Oral Daily  . pantoprazole  40 mg Oral BID  . polyethylene glycol  17 g Oral BID  . senna  2 tablet Oral BID WC  . topiramate  150 mg Oral QPM  . umeclidinium-vilanterol  1 puff Inhalation Daily  Continuous Infusions: . lactated ringers 10 mL/hr at 09/14/18 1236  . ondansetron (ZOFRAN) IV 8 mg (09/22/18 0957)     LOS: 22 days    Time spent: 35 minutes    Irine Seal, MD [facility]  If 7PM-7AM, please contact night-coverage [email or web address] 09/22/2018, 11:07 AM

## 2018-09-22 NOTE — TOC Transition Note (Addendum)
Transition of Care [facility]) - CM/SW Discharge Note   Patient Details  Name: DAVINITY FANARA MRN: [id number] Date of Birth: [birth year]  Transition of Care [facility]) CM/SW Contact:  Leeroy Cha, RN Phone Number: 09/22/2018, 12:30 PM   Clinical Narrative:    [facility] notified of dc Spoke with patient is getting a ride with her sister today.  Does not want travel o2 to go with her -I can make it home.   worried about her finances has medicare and [employer].  for expenses.  Wanted to know if we still a free drug program. Informed that we do not.  Asked if there was a voucher for her routine gas expenses.  Informed that was not a covered hospital item.  She will talk with ss to see if she has any assistance available.    Barriers to Discharge: No Barriers Identified   Patient Goals and CMS Choice        Discharge Placement                       Discharge Plan and Services                          HH Arranged: RN [facility] Agency: [place]        Social Determinants of Health (SDOH) Interventions     Readmission Risk Interventions Readmission Risk Prevention Plan 09/02/2018 08/31/2018  Transportation Screening - Complete  PCP or Specialist appointment within 3-5 days of discharge Complete -  [place] or [place] - Complete  SW Recovery Care/Counseling Consult - Complete  [place] - Not Applicable  Some recent data might be hidden

## 2018-09-22 NOTE — Progress Notes (Signed)
Stephanie Combs to be D/C'd Home per MD order.  Discussed prescriptions and follow up appointments with the patient. Prescriptions given to patient, medication list explained in detail. Pt verbalized understanding.  Allergies as of 09/22/2018      Reactions   Dilaudid [hydromorphone Hcl] Itching   Midodrine Hcl Swelling   After one dose had anaphylactic reaction, had to call EMS.   Actifed Cold-allergy [chlorpheniramine-phenylephrine]    "I was sick and red and it didn't agree with me at all"   Doxycycline Nausea And Vomiting   Other Cough   Pt states she is allergic to ragweed and that she starts coughing and sneezing like crazy   Penicillins Hives, Itching   Tolerates Rocephin Has patient had a PCN reaction causing immediate rash, facial/tongue/throat swelling, SOB or lightheadedness with hypotension: Yes Has patient had a PCN reaction causing severe rash involving mucus membranes or skin necrosis: No Has patient had a PCN reaction that required hospitalization Yes Has patient had a PCN reaction occurring within the last 10 years: No If all of the above answers are "NO", then may proceed with Cephalosporin use.   Reglan [metoclopramide] Itching   Valium [diazepam] Itching   Vistaril [hydroxyzine Hcl] Itching      Medication List    STOP taking these medications   ferrous sulfate 325 (65 FE) MG tablet     TAKE these medications   albuterol (2.5 MG/3ML) 0.083% nebulizer solution Commonly known as:  PROVENTIL Take 2.5 mg by nebulization 2 (two) times daily as needed for wheezing or shortness of breath. What changed:  Another medication with the same name was changed. Make sure you understand how and when to take each.   albuterol 108 (90 Base) MCG/ACT inhaler Commonly known as:  ProAir HFA Inhale 2 puffs into the lungs every 4 (four) hours as needed for wheezing or shortness of breath. For shortness of breath What changed:  reasons to take this   allopurinol 300 MG  tablet Commonly known as:  ZYLOPRIM Take 1 tablet (300 mg total) by mouth daily for 15 days.   ALPRAZolam 1 MG tablet Commonly known as:  XANAX Take 1 mg by mouth 3 (three) times daily as needed for anxiety or sleep.   aspirin EC 81 MG tablet Take 81 mg by mouth daily.   atorvastatin 80 MG tablet Commonly known as:  LIPITOR Take 1 tablet (80 mg total) by mouth daily at 6 PM. What changed:  when to take this   docusate sodium 100 MG capsule Commonly known as:  COLACE Take 3 capsules (300 mg total) by mouth daily for 30 days.   FISH OIL PO Take 1 capsule by mouth daily.   furosemide 40 MG tablet Commonly known as:  LASIX Take 1 tablet (40 mg total) by mouth 2 (two) times daily. What changed:    medication strength  how much to take  how to take this  when to take this  additional instructions   gabapentin 600 MG tablet Commonly known as:  NEURONTIN Take 600 mg by mouth 4 (four) times daily.   Gerhardt's butt cream Crea Apply 1 application topically 3 (three) times daily.   insulin aspart 100 UNIT/ML injection Commonly known as:  novoLOG Inject 1-18 Units into the skin 3 (three) times daily with meals. What changed:  how much to take   insulin detemir 100 UNIT/ML injection Commonly known as:  Levemir Inject 0.4 mLs (40 Units total) into the skin at bedtime.  levothyroxine 200 MCG tablet Commonly known as:  SYNTHROID Take 1 tablet by mouth daily.   nitroGLYCERIN 0.4 MG SL tablet Commonly known as:  NITROSTAT Place 0.4 mg under the tongue every 5 (five) minutes as needed for chest pain.   nystatin powder Commonly known as:  MYCOSTATIN/NYSTOP Apply topically 2 (two) times daily.   ondansetron 8 MG tablet Commonly known as:  ZOFRAN Take 1 tablet (8 mg total) by mouth every 8 (eight) hours as needed for nausea or vomiting.   oxyCODONE-acetaminophen 10-325 MG tablet Commonly known as:  PERCOCET Take 1 tablet by mouth every 6 (six) hours as needed for  pain.   OXYGEN Inhale 2 L into the lungs continuous.   pantoprazole 40 MG tablet Commonly known as:  PROTONIX Take 1 tablet (40 mg total) by mouth 2 (two) times daily.   polyethylene glycol 17 g packet Commonly known as:  MIRALAX / GLYCOLAX Take 17 g by mouth 2 (two) times daily.   potassium chloride 10 MEQ CR capsule Commonly known as:  MICRO-K Take 2 capsules (20 mEq total) by mouth 2 (two) times daily. *May take one additional tablet as needed for cramping   senna 8.6 MG Tabs tablet Commonly known as:  SENOKOT Take 2 tablets (17.2 mg total) by mouth 2 (two) times daily with a meal.   SURE COMFORT INS SYR 1CC/28G 28G X 1/2" 1 ML Misc Generic drug:  INS SYRINGE/NEEDLE 1CC/28G USE TO INJECT INSULIN UP TO 4 TIMES DAILY.   tiZANidine 4 MG tablet Commonly known as:  ZANAFLEX Take 1 tablet (4 mg total) by mouth every 8 (eight) hours as needed for muscle spasms.   topiramate 50 MG tablet Commonly known as:  TOPAMAX Take 150 mg by mouth every evening.   umeclidinium-vilanterol 62.5-25 MCG/INH Aepb Commonly known as:  ANORO ELLIPTA Inhale 1 puff into the lungs daily. What changed:    when to take this  reasons to take this   Vitamin D3 125 MCG (5000 UT) Caps Take 1 capsule (5,000 Units total) by mouth daily.       Vitals:   09/22/18 0201 09/22/18 0552  BP: (!) 114/50 (!) 112/57  Pulse: 75 72  Resp: 14 16  Temp: 98.4 F (36.9 C) 98 F (36.7 C)  SpO2: 100% 99%    Skin clean, dry and intact without evidence of skin break down, no evidence of skin tears noted. IV catheter discontinued intact. Site without signs and symptoms of complications. Dressing and pressure applied. Pt denies pain at this time. No complaints noted.  An After Visit Summary was printed and given to the patient. Patient escorted via [place], and D/C home via private auto.  Stephanie Combs 09/22/2018 6:06 PM

## 2018-09-22 NOTE — TOC Progression Note (Signed)
Transition of Care [facility]) - Progression Note    Patient Details  Name: Stephanie Combs MRN: [id number] Date of Birth: 11/10/1950  [facility] CM/SW Contact  Joaquin Courts, RN Phone Number: 09/22/2018, 11:16 AM  Clinical Narrative:     Discharge Readiness Return to top of Anemia, Iron Deficiency or Unspecified RRG - ISC  Discharge readiness is indicated by patient meeting Recovery Milestones, including ALL of the following: ? Hemodynamic stability  ? Etiology of anemia requiring inpatient care absent Yes ? Active blood loss absent Yes ? Signs and symptoms of anemia absent or improved Yes ? Hgb/Hct level stable and acceptable for next level of care No ? Mental status at baseline Yes ? Ambulatory[L]  Unable to determine, pt refuses to participate with therapy ? Oral hydration, medications, and diet Heart healthy/ carb modified  ?     Expected Discharge Plan: [place] Barriers to Discharge: No Barriers Identified  Expected Discharge Plan and Services Expected Discharge Plan: [place] arrangements for the past 2 months: [place] Agency: [place]         Social Determinants of Health (SDOH) Interventions    Readmission Risk Interventions Readmission Risk Prevention Plan 09/02/2018 08/31/2018  Transportation Screening - Complete  PCP or Specialist appointment within 3-5 days of discharge Complete -  [place] or [place] - Complete  SW Recovery Care/Counseling Consult - Complete  [place] - Not Applicable  Some recent data might be hidden

## 2018-09-22 NOTE — Progress Notes (Signed)
Radiation Oncology         [phone] ________________________________  Name: Stephanie Combs        MRN: [id number]  Date of Service: 09/21/18 DOB: 1951/05/11  CC:Stephanie Combs, Stephanie Rinks, MD  No ref. provider found     REFERRING PHYSICIAN: No ref. provider found   DIAGNOSIS: The primary encounter diagnosis was Symptomatic anemia. Diagnoses of Bilateral lower extremity edema, Leg pain, Pain of lower extremity, unspecified laterality, Anemia, Cough, Small cell lung cancer, left upper lobe ([place]), and Malignant neoplasm of hepatic flexure (HCC) were also pertinent to this visit.   HISTORY OF PRESENT ILLNESS: Stephanie Combs is a 68 y.o. female seen at the request of Dr. 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 The patient has a history of DM type 2, CAD, CHF, HTN, hypothyroidism and chronic lymphedema with morbid obesity. She was admitted to [facility] after a 5 day history of shortness of breath and progressive lower extremity edema. She was found to be anemic with an Hgb of 7, and received transfusion. Her stool was heme positive and GI evaluated her and she underwent EGD and colonoscopy on 09/09/2018 revealing a mass in the cecum about 1.5 cm that was biopsied and she also had several polyps throughout the colon that were also biopsied. Her EGD revealed some visible salmon colored epithelium that was noted above the GE junction. A few erosions in the gastric antrum were noted. Biopsies of the stomach had nonspecific reactive gastropathy and parietal cell hyperplasia. The esophagus revealed Barrett's Esophagus without dysplasia. She had tubular adenoma in the descending colon, and rectum, and adenocarcinoma in the hepatic flexure and splenic flexure biopsy. She was ultimately transferred to [facility], and  had a CT CAP on 09/11/2018 revealing  A stable 2.4 cm left adrenal nodule and bilateral renal calculi. There is no visible cecal lesion but an  apple core lesion measuring 4 x 3.9 cm in the ascending colon. In the chest, there was dilatation of the main pulmonary artery to 3.5 cm, adenopathy in the chest with the larges AP window node measuring 1.6 cm. There was a 2.1 cm subcarinal node as well and a perihilar left upper lobe mass measuring 5.4 x 4.2 cm with associated high grade stenosis of the left upper lobe bronchus. Postobstructive changes are noted in the lingula as well. She underwent bronchoscopy on 09/14/2018 with Dr. Loanne Combs that revealed a small cell carcinoma in the LUL. She began her first cycle of carboplatin/etoposide on Sunday 09/19/2018. Dr. Burr Combs plans to give Neulasta later this week and to resume her next cycle of chemotherapy. She is evaluated to consider chemoRT. She also had an MRI of the brain on 09/20/2018 that was negative for disease. Given her comorbidities she is not a surgical candidate for colectomy, and Dr. Burr Combs plans to treat her small cell lung cancer and requests that she have radiotherapy added to her second cycle of treatment to the chest.   PREVIOUS RADIATION THERAPY: No   PAST MEDICAL HISTORY:  Past Medical History:  Diagnosis Date   Anxiety    CAD (coronary artery disease)    a. s/p DES to LAD and angioplasty to D1 in 05/2017   CHF (congestive heart failure) (HCC)    Diastolic   Chronic back pain    Chronic pain    COPD (chronic obstructive pulmonary disease) ([place])    Degenerative disk disease    Diabetes mellitus without  complication ([place])    History of medication noncompliance 11/2017   "the patient frequently self-adjusts medications without notifying her physicians"   Hypertension    Hypothyroidism    On home O2    2.5 L N/C prn   Pedal edema        PAST SURGICAL HISTORY: Past Surgical History:  Procedure Laterality Date   BIOPSY  09/09/2018   Procedure: BIOPSY;  Surgeon: Stephanie Dolin, MD;  [facility];  Service: Endoscopy;;  gastric esophageal hepatic  flexure colon   COLONOSCOPY WITH PROPOFOL N/A 09/09/2018   Procedure: COLONOSCOPY WITH PROPOFOL;  Surgeon: Stephanie Dolin, MD;  [facility];  Service: Endoscopy;  Laterality: N/A;   CORONARY STENT INTERVENTION N/A 05/22/2017   Procedure: CORONARY STENT INTERVENTION;  Surgeon: Martinique, Peter M, MD;  Location: [facility];  Service: Cardiovascular;  Laterality: N/A;   ESOPHAGOGASTRODUODENOSCOPY (EGD) WITH PROPOFOL N/A 09/09/2018   Procedure: ESOPHAGOGASTRODUODENOSCOPY (EGD) WITH PROPOFOL;  Surgeon: Stephanie Dolin, MD;  [facility];  Service: Endoscopy;  Laterality: N/A;   HERNIA REPAIR     POLYPECTOMY  09/09/2018   Procedure: POLYPECTOMY;  Surgeon: Stephanie Dolin, MD;  [facility];  Service: Endoscopy;;  colon   RIGHT/LEFT HEART CATH AND CORONARY ANGIOGRAPHY N/A 05/19/2017   Procedure: RIGHT/LEFT HEART CATH AND CORONARY ANGIOGRAPHY;  Surgeon: Stephanie Man, MD;  Location: [place] [facility];  Service: Cardiovascular;  Laterality: N/A;   TUBAL LIGATION     VIDEO BRONCHOSCOPY WITH ENDOBRONCHIAL ULTRASOUND Left 09/14/2018   Procedure: VIDEO BRONCHOSCOPY WITH ENDOBRONCHIAL ULTRASOUND with biopsies;  Surgeon: Margaretha Seeds, MD;  Location: [facility] OR;  Service: Thoracic;  Laterality: Left;     FAMILY HISTORY:  Family History  Problem Relation Age of Onset   Stroke Mother        deceased 97    Heart attack Father        deceased 58    Diabetes Brother    Cerebral palsy Brother    Pneumonia Brother    Diabetes Other    Heart attack Other    Colon cancer Neg Hx      SOCIAL HISTORY:  reports that she quit smoking about 3 years ago. Her smoking use included cigarettes. She has a 67.50 pack-year smoking history. She has never used smokeless tobacco. She reports that she does not drink alcohol or use drugs. The patient is widowed and lives in [place]. She relies on a pastor to take her to appointments, and has a daughter who also is  involved in her care.   ALLERGIES: Dilaudid [hydromorphone hcl]; Midodrine hcl; Actifed cold-allergy [chlorpheniramine-phenylephrine]; Doxycycline; Other; Penicillins; Reglan [metoclopramide]; Valium [diazepam]; and Vistaril [hydroxyzine hcl]   MEDICATIONS:  Current Facility-Administered Medications  Medication Dose Route Frequency Provider Last Rate Last Dose   0.9 %  sodium chloride infusion (Manually program via Guardrails IV Fluids)   Intravenous Once Maryanna Shape, NP       acetaminophen (TYLENOL) tablet 650 mg  650 mg Oral Q6H PRN Hosie Poisson, MD   650 mg at 09/03/18 8144   Or   acetaminophen (TYLENOL) suppository 650 mg  650 mg Rectal Q6H PRN Hosie Poisson, MD       albuterol (PROVENTIL) (2.5 MG/3ML) 0.083% nebulizer solution 3 mL  3 mL Inhalation Q4H PRN Hosie Poisson, MD       allopurinol (ZYLOPRIM) tablet 300 mg  300 mg Oral Daily Magrinat, Virgie Dad, MD  300 mg at 09/21/18 0911   ALPRAZolam (XANAX) tablet 1 mg  1 mg Oral TID PRN Tawni Millers, MD   1 mg at 09/22/18 1696   alteplase (CATHFLO ACTIVASE) injection 2 mg  2 mg Intracatheter Once PRN Truitt Merle, MD       aspirin EC tablet 81 mg  81 mg Oral Daily Hosie Poisson, MD   81 mg at 09/21/18 0911   atorvastatin (LIPITOR) tablet 80 mg  80 mg Oral q morning - 10a Hosie Poisson, MD   80 mg at 09/21/18 0910   benzonatate (TESSALON) capsule 200 mg  200 mg Oral TID PRN Hosie Poisson, MD       bisacodyl (DULCOLAX) EC tablet 10 mg  10 mg Oral Daily PRN Hosie Poisson, MD   10 mg at 09/06/18 1121   cholecalciferol (VITAMIN D3) tablet 5,000 Units  5,000 Units Oral Daily Hosie Poisson, MD   5,000 Units at 09/21/18 0930   furosemide (LASIX) tablet 40 mg  40 mg Oral BID Tawni Millers, MD   40 mg at 09/22/18 7893   gabapentin (NEURONTIN) capsule 300 mg  300 mg Oral QID Hosie Poisson, MD   300 mg at 09/21/18 2149   Gerhardt's butt cream 1 application  1 application Topical TID Hosie Poisson, MD   1  application at 81/01/75 0913   guaiFENesin-dextromethorphan (ROBITUSSIN DM) 100-10 MG/5ML syrup 5 mL  5 mL Oral Q4H PRN Arrien, Jimmy Picket, MD   5 mL at 09/21/18 2156   heparin lock flush 100 unit/mL  500 Units Intracatheter Once PRN Truitt Merle, MD       heparin lock flush 100 unit/mL  250 Units Intracatheter Once PRN Truitt Merle, MD       insulin aspart (novoLOG) injection 0-15 Units  0-15 Units Subcutaneous TID WC Hosie Poisson, MD   5 Units at 09/21/18 1731   insulin aspart (novoLOG) injection 0-5 Units  0-5 Units Subcutaneous QHS Hosie Poisson, MD   2 Units at 09/21/18 2149   insulin aspart (novoLOG) injection 8 Units  8 Units Subcutaneous TID WC Arrien, Jimmy Picket, MD   8 Units at 09/22/18 0809   insulin detemir (LEVEMIR) injection 36 Units  36 Units Subcutaneous QHS Tawni Millers, MD   36 Units at 09/21/18 2149   lactated ringers infusion   Intravenous Continuous Hosie Poisson, MD 10 mL/hr at 09/14/18 1236     levothyroxine (SYNTHROID) tablet 200 mcg  200 mcg Oral QAC breakfast Hosie Poisson, MD   200 mcg at 09/22/18 0555   morphine 2 MG/ML injection 2 mg  2 mg Intravenous Q3H PRN Arrien, Jimmy Picket, MD       nitroGLYCERIN (NITROSTAT) SL tablet 0.4 mg  0.4 mg Sublingual Q5 min PRN Hosie Poisson, MD   0.4 mg at 09/19/18 0946   nystatin (MYCOSTATIN/NYSTOP) topical powder   Topical BID Hosie Poisson, MD       omega-3 acid ethyl esters (LOVAZA) capsule 1 g  1 g Oral Daily Hosie Poisson, MD   1 g at 09/21/18 0911   ondansetron (ZOFRAN) 8 mg in sodium chloride 0.9 % 50 mL IVPB  8 mg Intravenous Q8H Magrinat, Virgie Dad, MD 216 mL/hr at 09/22/18 0557 8 mg at 09/22/18 0557   ondansetron (ZOFRAN) tablet 8 mg  8 mg Oral Q8H PRN Hosie Poisson, MD   8 mg at 09/18/18 2259   oxyCODONE-acetaminophen (PERCOCET/ROXICET) 5-325 MG per tablet 1 tablet  1 tablet Oral Q6H PRN Hosie Poisson,  MD   1 tablet at 09/21/18 2327   And   oxyCODONE (Oxy IR/ROXICODONE) immediate release  tablet 5 mg  5 mg Oral Q6H PRN Hosie Poisson, MD   5 mg at 09/21/18 2327   pantoprazole (PROTONIX) EC tablet 40 mg  40 mg Oral BID Hosie Poisson, MD   40 mg at 09/21/18 2149   polyethylene glycol (MIRALAX / GLYCOLAX) packet 17 g  17 g Oral BID Hosie Poisson, MD   17 g at 09/21/18 2148   prochlorperazine (COMPAZINE) injection 5 mg  5 mg Intravenous Q6H PRN Magrinat, Virgie Dad, MD       senna (SENOKOT) tablet 17.2 mg  2 tablet Oral BID WC Hosie Poisson, MD   17.2 mg at 09/22/18 8338   sodium chloride flush (NS) 0.9 % injection 10 mL  10 mL Intracatheter PRN Truitt Merle, MD       sodium chloride flush (NS) 0.9 % injection 3 mL  3 mL Intracatheter PRN Truitt Merle, MD       tiZANidine (ZANAFLEX) tablet 2 mg  2 mg Oral Q8H PRN Hosie Poisson, MD   2 mg at 09/21/18 0249   topiramate (TOPAMAX) tablet 150 mg  150 mg Oral QPM Hosie Poisson, MD   150 mg at 09/21/18 1706   umeclidinium-vilanterol (ANORO ELLIPTA) 62.5-25 MCG/INH 1 puff  1 puff Inhalation Daily Hosie Poisson, MD   1 puff at 09/22/18 2505     REVIEW OF SYSTEMS: On review of systems, the patient reports that she is doing okay. She has weight fluctuations from her CHF and reports she can fluctuate 20+ pounds. She denies any progressive weight loss unrelated. She reports she has been hoarse for many years and attributes this to her CHF as well. She denies any SOB at rest, but does have this with exertion. She denies any abdominal pain, nausea, and no other complaints are verbalized.      PHYSICAL EXAM:  Wt Readings from Last 3 Encounters:  09/19/18 (!) 316 lb 9.3 oz (143.6 kg)  08/25/18 (!) 305 lb (138.3 kg)  08/05/18 (!) 311 lb (141.1 kg)   Temp Readings from Last 3 Encounters:  09/22/18 98 F (36.7 C) (Oral)  08/05/18 98 F (36.7 C) (Oral)  07/08/18 98.3 F (36.8 C) (Oral)   BP Readings from Last 3 Encounters:  09/22/18 (!) 112/57  08/25/18 110/62  08/05/18 (!) 127/51   Pulse Readings from Last 3 Encounters:  09/22/18 72   08/25/18 70  08/05/18 66   Pain Assessment Pain Score: 7  Pain Frequency: Constant/10  Unable to assess due to nature of discussion.   ECOG = 2  0 - Asymptomatic (Fully active, able to carry on all predisease activities without restriction)  1 - Symptomatic but completely ambulatory (Restricted in physically strenuous activity but ambulatory and able to carry out work of a light or sedentary nature. For example, light housework, office work)  2 - Symptomatic, <50% in bed during the day (Ambulatory and capable of all self care but unable to carry out any work activities. Up and about more than 50% of waking hours)  3 - Symptomatic, >50% in bed, but not bedbound (Capable of only limited self-care, confined to bed or chair 50% or more of waking hours)  4 - Bedbound (Completely disabled. Cannot carry on any self-care. Totally confined to bed or chair)  5 - Death   Eustace Pen MM, Creech RH, Tormey DC, et al. [phone]). "Toxicity and response criteria of the [place]  Cooperative Oncology Group". [place] Oncol. 5 (6): 649-55    LABORATORY DATA:  Lab Results  Component Value Date   WBC 6.2 09/21/2018   HGB 7.1 (L) 09/21/2018   HCT 24.2 (L) 09/21/2018   MCV 90.6 09/21/2018   PLT 238 09/21/2018   Lab Results  Component Value Date   NA 134 (L) 09/22/2018   K 4.0 09/22/2018   CL 101 09/22/2018   CO2 27 09/22/2018   Lab Results  Component Value Date   ALT 12 09/22/2018   AST 15 09/22/2018   ALKPHOS 75 09/22/2018   BILITOT 0.6 09/22/2018      RADIOGRAPHY: Dg Chest 2 View  Result Date: 09/17/2018 CLINICAL DATA:  Recent onset of cough. EXAM: CHEST - 2 VIEW COMPARISON:  Chest CT from 6 days ago FINDINGS: Left hilar mass with left upper lobe collapse, new. Normal heart size when accounting for rotation and low volumes. IMPRESSION: Collapse of the left upper lobe in this patient with known hilar mass. Electronically Signed   By: Monte Fantasia Combs.D.   On: 09/17/2018 11:18   Ct Chest  W Contrast  Result Date: 09/11/2018 CLINICAL DATA:  68 year old female inpatient admitted with symptomatic anemia and decompensated heart failure, found to have hepatic flexure colon mass on colonoscopy 09/09/2018. staging evaluation. EXAM: CT CHEST, ABDOMEN, AND PELVIS WITH CONTRAST TECHNIQUE: Multidetector CT imaging of the chest, abdomen and pelvis was performed following the standard protocol during bolus administration of intravenous contrast. CONTRAST:  127mL OMNIPAQUE IOHEXOL 300 MG/ML  SOLN COMPARISON:  08/30/2018 chest radiograph. 01/27/2017 chest CT angiogram. 04/05/2018 CT abdomen/pelvis. FINDINGS: CT CHEST FINDINGS Cardiovascular: Borderline mild cardiomegaly. No significant pericardial effusion/thickening. Left anterior descending coronary atherosclerosis. Atherosclerotic nonaneurysmal thoracic aorta. Dilated main pulmonary artery (3.5 cm diameter). No central pulmonary emboli. Mediastinum/Nodes: No discrete thyroid nodules. Unremarkable esophagus. No axillary adenopathy. Multiple enlarged left prevascular/AP window lymph nodes, largest 1.6 cm (series 3/image 18), new since 01/27/2017 chest CT. New enlarged 2.1 cm subcarinal node (series 3/image 23). No right hilar adenopathy. Lungs/Pleura: No pneumothorax. Trace dependent left pleural effusion. No right pleural effusion. Central perihilar left upper lobe 5.4 x 4.2 cm lung mass (series 3/image 27), new since 01/27/2017 chest CT, with associated high-grade stenosis of the left upper lobe bronchus. Postobstructive consolidation in lingula with some associated volume loss. No significant pulmonary nodules. Mild passive atelectasis in dependent lower lobes. Musculoskeletal: No aggressive appearing focal osseous lesions. Mild thoracic spondylosis. CT ABDOMEN PELVIS FINDINGS Hepatobiliary: Normal liver size. No liver mass. Normal gallbladder with no radiopaque cholelithiasis. No biliary ductal dilatation. Pancreas: Normal, with no mass or duct dilation.  Spleen: Normal size. No mass. Adrenals/Urinary Tract: Left adrenal 2.4 cm nodule with density 14 HU, stable in size since 04/30/2015 CT, compatible with a benign adenoma. Stable appearance of the right adrenal gland without discrete right adrenal nodule. Punctate nonobstructing interpolar right and upper left renal stones. No hydronephrosis. Subcentimeter hypodense renal cortical lesion in the lower left kidney is too small to characterize and is unchanged, considered benign. No additional renal lesions. Normal bladder. Stomach/Bowel: Small hiatal hernia. Otherwise normal nondistended stomach. Normal caliber small bowel with no small bowel wall thickening. Appendix not discretely visualized. No pericecal inflammatory changes. Oral contrast transits to the right colon. There is an apple-core lesion in the ascending colon measuring 4.0 x 3.9 cm (series 5/image 60) with irregular annular wall thickening. No additional sites of large bowel wall thickening. No significant colonic diverticulosis. No acute pericolonic fat stranding. Moderate diffuse  colonic stool volume. Vascular/Lymphatic: Atherosclerotic nonaneurysmal abdominal aorta. Patent portal, splenic, hepatic and renal veins. Top-normal size 0.6 cm right mesenteric node (series 3/image 73). No pathologically enlarged lymph nodes in the abdomen or pelvis. Reproductive: Grossly normal uterus.  No adnexal mass. Other: No pneumoperitoneum, ascites or focal fluid collection. Musculoskeletal: No aggressive appearing focal osseous lesions. Marked lumbar spondylosis. IMPRESSION: 1. Central perihilar left upper lobe 5.4 cm lung mass, new since 01/27/2017 chest CT, compatible with malignancy, favor primary bronchogenic carcinoma. Postobstructive pneumonia/atelectasis in the lingula. 2. Mediastinal adenopathy in the left prevascular/AP window and subcarinal stations, compatible with metastatic nodes (more likely originating from the left upper lobe lung mass). 3. Trace  dependent left pleural effusion. 4. Apple-core lesion in the ascending colon, compatible with known primary colonic neoplasm. Adjacent top-normal size right mesenteric lymph node is equivocal for locoregional nodal metastasis. No additional potential findings of metastatic disease in the abdomen or pelvis. 5. Chronic findings include: Aortic Atherosclerosis (ICD10-I70.0). Borderline mild cardiomegaly. One vessel coronary atherosclerosis. Dilated main pulmonary artery, suggesting pulmonary arterial hypertension. Left adrenal adenoma. Punctate bilateral nephrolithiasis. Small hiatal hernia. Electronically Signed   By: Ilona Sorrel Combs.D.   On: 09/11/2018 16:42   Mr Jeri Cos YQ Contrast  Result Date: 09/20/2018 CLINICAL DATA:  Small-cell lung cancer staging. EXAM: MRI HEAD WITHOUT AND WITH CONTRAST TECHNIQUE: Multiplanar, multiecho pulse sequences of the brain and surrounding structures were obtained without and with intravenous contrast. CONTRAST:  10 mL Gadavist COMPARISON:  Head CT 01/06/2018 FINDINGS: Some sequences are moderately to severely motion degraded despite repeated imaging attempts. Brain: There is no evidence of acute infarct, intracranial hemorrhage, mass, midline shift, or extra-axial fluid collection. Patchy to confluent T2 hyperintensities in the cerebral white matter and pons are nonspecific but compatible with moderate to severe chronic small vessel ischemic disease, mildly progressed from the prior MRI. There is mild cerebral atrophy. No enhancing brain lesions are identified, however sensitivity for detection of small lesions is reduced due to the degree of motion artifact. Vascular: Major intracranial vascular flow voids are preserved with the left vertebral artery being dominant. Skull and upper cervical spine: Diffuse heterogeneity in the bone marrow of the skull with hyperostosis frontalis noted. A few small foci of signal are noted in the skull on diffusion imaging which are new from the  prior study with the largest measuring 1 cm in the lateral right frontal region and demonstrating apparent enhancement (series 3, image 38 and series 12, image 79). No destructive skull lesion is identified. Sinuses/Orbits: Unremarkable orbits. Clear paranasal sinuses. Small right mastoid effusion. Other: None. IMPRESSION: 1. Motion degraded examination without brain metastases identified. 2. Small skull metastases not excluded. 3. Moderate to severe chronic small vessel ischemic disease. Electronically Signed   By: Logan Bores Combs.D.   On: 09/20/2018 18:56   Ct Abdomen Pelvis W Contrast  Result Date: 09/11/2018 CLINICAL DATA:  68 year old female inpatient admitted with symptomatic anemia and decompensated heart failure, found to have hepatic flexure colon mass on colonoscopy 09/09/2018. staging evaluation. EXAM: CT CHEST, ABDOMEN, AND PELVIS WITH CONTRAST TECHNIQUE: Multidetector CT imaging of the chest, abdomen and pelvis was performed following the standard protocol during bolus administration of intravenous contrast. CONTRAST:  185mL OMNIPAQUE IOHEXOL 300 MG/ML  SOLN COMPARISON:  08/30/2018 chest radiograph. 01/27/2017 chest CT angiogram. 04/05/2018 CT abdomen/pelvis. FINDINGS: CT CHEST FINDINGS Cardiovascular: Borderline mild cardiomegaly. No significant pericardial effusion/thickening. Left anterior descending coronary atherosclerosis. Atherosclerotic nonaneurysmal thoracic aorta. Dilated main pulmonary artery (3.5 cm diameter). No central pulmonary  emboli. Mediastinum/Nodes: No discrete thyroid nodules. Unremarkable esophagus. No axillary adenopathy. Multiple enlarged left prevascular/AP window lymph nodes, largest 1.6 cm (series 3/image 18), new since 01/27/2017 chest CT. New enlarged 2.1 cm subcarinal node (series 3/image 23). No right hilar adenopathy. Lungs/Pleura: No pneumothorax. Trace dependent left pleural effusion. No right pleural effusion. Central perihilar left upper lobe 5.4 x 4.2 cm lung mass  (series 3/image 27), new since 01/27/2017 chest CT, with associated high-grade stenosis of the left upper lobe bronchus. Postobstructive consolidation in lingula with some associated volume loss. No significant pulmonary nodules. Mild passive atelectasis in dependent lower lobes. Musculoskeletal: No aggressive appearing focal osseous lesions. Mild thoracic spondylosis. CT ABDOMEN PELVIS FINDINGS Hepatobiliary: Normal liver size. No liver mass. Normal gallbladder with no radiopaque cholelithiasis. No biliary ductal dilatation. Pancreas: Normal, with no mass or duct dilation. Spleen: Normal size. No mass. Adrenals/Urinary Tract: Left adrenal 2.4 cm nodule with density 14 HU, stable in size since 04/30/2015 CT, compatible with a benign adenoma. Stable appearance of the right adrenal gland without discrete right adrenal nodule. Punctate nonobstructing interpolar right and upper left renal stones. No hydronephrosis. Subcentimeter hypodense renal cortical lesion in the lower left kidney is too small to characterize and is unchanged, considered benign. No additional renal lesions. Normal bladder. Stomach/Bowel: Small hiatal hernia. Otherwise normal nondistended stomach. Normal caliber small bowel with no small bowel wall thickening. Appendix not discretely visualized. No pericecal inflammatory changes. Oral contrast transits to the right colon. There is an apple-core lesion in the ascending colon measuring 4.0 x 3.9 cm (series 5/image 60) with irregular annular wall thickening. No additional sites of large bowel wall thickening. No significant colonic diverticulosis. No acute pericolonic fat stranding. Moderate diffuse colonic stool volume. Vascular/Lymphatic: Atherosclerotic nonaneurysmal abdominal aorta. Patent portal, splenic, hepatic and renal veins. Top-normal size 0.6 cm right mesenteric node (series 3/image 73). No pathologically enlarged lymph nodes in the abdomen or pelvis. Reproductive: Grossly normal uterus.  No  adnexal mass. Other: No pneumoperitoneum, ascites or focal fluid collection. Musculoskeletal: No aggressive appearing focal osseous lesions. Marked lumbar spondylosis. IMPRESSION: 1. Central perihilar left upper lobe 5.4 cm lung mass, new since 01/27/2017 chest CT, compatible with malignancy, favor primary bronchogenic carcinoma. Postobstructive pneumonia/atelectasis in the lingula. 2. Mediastinal adenopathy in the left prevascular/AP window and subcarinal stations, compatible with metastatic nodes (more likely originating from the left upper lobe lung mass). 3. Trace dependent left pleural effusion. 4. Apple-core lesion in the ascending colon, compatible with known primary colonic neoplasm. Adjacent top-normal size right mesenteric lymph node is equivocal for locoregional nodal metastasis. No additional potential findings of metastatic disease in the abdomen or pelvis. 5. Chronic findings include: Aortic Atherosclerosis (ICD10-I70.0). Borderline mild cardiomegaly. One vessel coronary atherosclerosis. Dilated main pulmonary artery, suggesting pulmonary arterial hypertension. Left adrenal adenoma. Punctate bilateral nephrolithiasis. Small hiatal hernia. Electronically Signed   By: Ilona Sorrel Combs.D.   On: 09/11/2018 16:42   US Venous Img Lower Bilateral  Result Date: 09/08/2018 CLINICAL DATA:  Lower extremity pain EXAM: BILATERAL LOWER EXTREMITY VENOUS DUPLEX ULTRASOUND TECHNIQUE: Doppler venous assessment of the bilateral lower extremity deep venous system was performed, including characterization of spectral flow, compressibility, and phasicity. COMPARISON:  None. FINDINGS: There is complete compressibility of the bilateral common femoral, femoral, and popliteal veins. Doppler analysis demonstrates respiratory phasicity and augmentation of flow with calf compression. No obvious superficial vein or calf vein thrombosis. IMPRESSION: No evidence of lower extremity DVT. Electronically Signed   By: Marybelle Killings Combs.D.    On:  09/08/2018 13:27   Dg Chest Portable 1 View  Result Date: 08/30/2018 CLINICAL DATA:  Short of breath EXAM: PORTABLE CHEST 1 VIEW COMPARISON:  07/05/2018 FINDINGS: Mild cardiac enlargement. Negative for heart failure or edema. Mild left lower lobe airspace disease and small left effusion. Right lung base clear. IMPRESSION: Mild left lower lobe atelectasis/infiltrate and small left effusion. Mild progression since the prior study. Electronically Signed   By: Franchot Gallo Combs.D.   On: 08/30/2018 21:33       IMPRESSION/PLAN: 1. 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 As above, the patient started her first cycle of carboplatin/etoposide. She will continue with Dr. Burr Combs and receive Neulasta later this week. We will plan to have the patient begin radiotherapy along with her chemotherapy during her 2nd cycle of treatment. I spent time with the patient by phone discussing her case, and the rationale to use radiotherapy in the curative setting. We discussed risks, benefits, short, and long term effects of radiotherapy and the patient desires her treatment in [name]. We will plan to simulate prior to her second cycle of treatment.  2. Transportation. During our discussion, the patient verbalizes that she has a pastor who helps her with transportation but may need additional resources as well. I've placed a referral for CM.  3. PCI. The patient's recent MRI was negative, but I discussed with her the concerns that small cell cancer patients have up to a 60% risk of CNS disease, and we can reduce that risk to 20% with prophylactic cranial irradiation. We will discuss this further at a later time as we would not proceed with this until completion of her chemotherapy. with her colon cancer, the timing of offering PCI is to be determined. 4. 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 We will follow along regarding management, however her primary therapy will be directed to the  lung cancer.  5. CHF, CAD. She will follow up with her cardiologist regarding recommendations moving forward.    In a visit lasting 70 minutes, greater than 50% of the time was spent discussing her case, and in floor time, coordinating the patient's care.    Carola Rhine, PAC

## 2018-09-23 DIAGNOSIS — E1165 Type 2 diabetes mellitus with hyperglycemia: Secondary | ICD-10-CM | POA: Diagnosis not present

## 2018-09-23 LAB — TYPE AND SCREEN
ABO/RH(D): A POS
Antibody Screen: POSITIVE
Donor AG Type: NEGATIVE
PT AG Type: NEGATIVE
Unit division: 0

## 2018-09-23 LAB — BPAM RBC
Blood Product Expiration Date: 202006192359
ISSUE DATE / TIME: 202005130141
Unit Type and Rh: 5100

## 2018-09-24 ENCOUNTER — Telehealth: Payer: Self-pay

## 2018-09-24 NOTE — Telephone Encounter (Signed)
Left voice message for patient to remind her per Dr. Burr Medico that her appointment on Monday 5/18 at 2:30 with Dr. Burr Medico is at [facility] in [place] not at [facility].  I provided her with the address.

## 2018-09-27 ENCOUNTER — Other Ambulatory Visit: Payer: Self-pay

## 2018-09-27 ENCOUNTER — Inpatient Hospital Stay: Payer: Medicare Other | Attending: Hematology | Admitting: Hematology

## 2018-09-27 ENCOUNTER — Ambulatory Visit: Payer: Medicare Other | Admitting: Hematology

## 2018-09-27 ENCOUNTER — Inpatient Hospital Stay: Payer: Medicare Other

## 2018-09-27 VITALS — BP 149/72 | HR 90 | Temp 99.1°F | Resp 18 | Ht 66.0 in | Wt 316.1 lb

## 2018-09-27 DIAGNOSIS — D3502 Benign neoplasm of left adrenal gland: Secondary | ICD-10-CM | POA: Diagnosis not present

## 2018-09-27 DIAGNOSIS — Z86010 Personal history of colonic polyps: Secondary | ICD-10-CM | POA: Insufficient documentation

## 2018-09-27 DIAGNOSIS — Z7982 Long term (current) use of aspirin: Secondary | ICD-10-CM

## 2018-09-27 DIAGNOSIS — I700 Atherosclerosis of aorta: Secondary | ICD-10-CM | POA: Insufficient documentation

## 2018-09-27 DIAGNOSIS — R609 Edema, unspecified: Secondary | ICD-10-CM | POA: Diagnosis not present

## 2018-09-27 DIAGNOSIS — K449 Diaphragmatic hernia without obstruction or gangrene: Secondary | ICD-10-CM

## 2018-09-27 DIAGNOSIS — K319 Disease of stomach and duodenum, unspecified: Secondary | ICD-10-CM | POA: Diagnosis not present

## 2018-09-27 DIAGNOSIS — G8929 Other chronic pain: Secondary | ICD-10-CM | POA: Diagnosis not present

## 2018-09-27 DIAGNOSIS — Z794 Long term (current) use of insulin: Secondary | ICD-10-CM | POA: Insufficient documentation

## 2018-09-27 DIAGNOSIS — E119 Type 2 diabetes mellitus without complications: Secondary | ICD-10-CM | POA: Diagnosis not present

## 2018-09-27 DIAGNOSIS — C3412 Malignant neoplasm of upper lobe, left bronchus or lung: Secondary | ICD-10-CM

## 2018-09-27 DIAGNOSIS — J449 Chronic obstructive pulmonary disease, unspecified: Secondary | ICD-10-CM | POA: Diagnosis not present

## 2018-09-27 DIAGNOSIS — I10 Essential (primary) hypertension: Secondary | ICD-10-CM | POA: Diagnosis not present

## 2018-09-27 DIAGNOSIS — N200 Calculus of kidney: Secondary | ICD-10-CM | POA: Insufficient documentation

## 2018-09-27 DIAGNOSIS — E669 Obesity, unspecified: Secondary | ICD-10-CM | POA: Diagnosis not present

## 2018-09-27 DIAGNOSIS — D500 Iron deficiency anemia secondary to blood loss (chronic): Secondary | ICD-10-CM | POA: Diagnosis not present

## 2018-09-27 DIAGNOSIS — E039 Hypothyroidism, unspecified: Secondary | ICD-10-CM | POA: Insufficient documentation

## 2018-09-27 DIAGNOSIS — Z7689 Persons encountering health services in other specified circumstances: Secondary | ICD-10-CM

## 2018-09-27 DIAGNOSIS — I2510 Atherosclerotic heart disease of native coronary artery without angina pectoris: Secondary | ICD-10-CM | POA: Diagnosis not present

## 2018-09-27 DIAGNOSIS — C183 Malignant neoplasm of hepatic flexure: Secondary | ICD-10-CM | POA: Diagnosis not present

## 2018-09-27 DIAGNOSIS — E1122 Type 2 diabetes mellitus with diabetic chronic kidney disease: Secondary | ICD-10-CM

## 2018-09-27 DIAGNOSIS — I5032 Chronic diastolic (congestive) heart failure: Secondary | ICD-10-CM

## 2018-09-27 DIAGNOSIS — F329 Major depressive disorder, single episode, unspecified: Secondary | ICD-10-CM

## 2018-09-27 DIAGNOSIS — Z66 Do not resuscitate: Secondary | ICD-10-CM

## 2018-09-27 DIAGNOSIS — M069 Rheumatoid arthritis, unspecified: Secondary | ICD-10-CM | POA: Diagnosis not present

## 2018-09-27 DIAGNOSIS — N183 Chronic kidney disease, stage 3 unspecified: Secondary | ICD-10-CM

## 2018-09-27 DIAGNOSIS — M549 Dorsalgia, unspecified: Secondary | ICD-10-CM | POA: Diagnosis not present

## 2018-09-27 DIAGNOSIS — I509 Heart failure, unspecified: Secondary | ICD-10-CM | POA: Insufficient documentation

## 2018-09-27 DIAGNOSIS — I517 Cardiomegaly: Secondary | ICD-10-CM | POA: Insufficient documentation

## 2018-09-27 DIAGNOSIS — J90 Pleural effusion, not elsewhere classified: Secondary | ICD-10-CM

## 2018-09-27 DIAGNOSIS — Z79899 Other long term (current) drug therapy: Secondary | ICD-10-CM | POA: Insufficient documentation

## 2018-09-27 LAB — CMP (CANCER CENTER ONLY)
ALT: 10 U/L (ref 0–44)
AST: 13 U/L — ABNORMAL LOW (ref 15–41)
Albumin: 3.2 g/dL — ABNORMAL LOW (ref 3.5–5.0)
Alkaline Phosphatase: 96 U/L (ref 38–126)
Anion gap: 7 (ref 5–15)
BUN: 29 mg/dL — ABNORMAL HIGH (ref 8–23)
CO2: 28 mmol/L (ref 22–32)
Calcium: 8.3 mg/dL — ABNORMAL LOW (ref 8.9–10.3)
Chloride: 106 mmol/L (ref 98–111)
Creatinine: 1.35 mg/dL — ABNORMAL HIGH (ref 0.44–1.00)
GFR, Est AFR Am: 47 mL/min — ABNORMAL LOW (ref >60)
GFR, Estimated: 40 mL/min — ABNORMAL LOW (ref >60)
Glucose, Bld: 184 mg/dL — ABNORMAL HIGH (ref 70–99)
Potassium: 4.5 mmol/L (ref 3.5–5.1)
Sodium: 141 mmol/L (ref 135–145)
Total Bilirubin: 0.3 mg/dL (ref 0.3–1.2)
Total Protein: 6 g/dL — ABNORMAL LOW (ref 6.5–8.1)

## 2018-09-27 LAB — CBC WITH DIFFERENTIAL (CANCER CENTER ONLY)
Abs Immature Granulocytes: 0.02 10*3/uL (ref 0.00–0.07)
Basophils Absolute: 0 10*3/uL (ref 0.0–0.1)
Basophils Relative: 2 %
Eosinophils Absolute: 0.1 10*3/uL (ref 0.0–0.5)
Eosinophils Relative: 4 %
HCT: 27.2 % — ABNORMAL LOW (ref 36.0–46.0)
Hemoglobin: 7.9 g/dL — ABNORMAL LOW (ref 12.0–15.0)
Immature Granulocytes: 1 %
Lymphocytes Relative: 40 %
Lymphs Abs: 1.1 10*3/uL (ref 0.7–4.0)
MCH: 26.6 pg (ref 26.0–34.0)
MCHC: 29 g/dL — ABNORMAL LOW (ref 30.0–36.0)
MCV: 91.6 fL (ref 80.0–100.0)
Monocytes Absolute: 0.1 10*3/uL (ref 0.1–1.0)
Monocytes Relative: 3 %
Neutro Abs: 1.3 10*3/uL — ABNORMAL LOW (ref 1.7–7.7)
Neutrophils Relative %: 50 %
Platelet Count: 159 10*3/uL (ref 150–400)
RBC: 2.97 MIL/uL — ABNORMAL LOW (ref 3.87–5.11)
RDW: 20.3 % — ABNORMAL HIGH (ref 11.5–15.5)
WBC Count: 2.7 10*3/uL — ABNORMAL LOW (ref 4.0–10.5)
nRBC: 0 % (ref 0.0–0.2)

## 2018-09-27 MED ORDER — PEGFILGRASTIM INJECTION 6 MG/0.6ML ~~LOC~~
PREFILLED_SYRINGE | SUBCUTANEOUS | Status: AC
  Filled 2018-09-27: qty 0.6

## 2018-09-27 MED ORDER — PEGFILGRASTIM INJECTION 6 MG/0.6ML ~~LOC~~
6.0000 mg | PREFILLED_SYRINGE | Freq: Once | SUBCUTANEOUS | Status: AC
  Administered 2018-09-27: 16:00:00 6 mg via SUBCUTANEOUS

## 2018-09-27 NOTE — Patient Instructions (Signed)
Pegfilgrastim injection  What is this medicine?  PEGFILGRASTIM (PEG fil gra stim) is a long-acting granulocyte colony-stimulating factor that stimulates the growth of neutrophils, a type of white blood cell important in the body's fight against infection. It is used to reduce the incidence of fever and infection in patients with certain types of cancer who are receiving chemotherapy that affects the bone marrow, and to increase survival after being exposed to high doses of radiation.  This medicine may be used for other purposes; ask your health care provider or pharmacist if you have questions.  COMMON BRAND NAME(S): Fulphila, Neulasta, UDENYCA  What should I tell my health care provider before I take this medicine?  They need to know if you have any of these conditions:  -kidney disease  -latex allergy  -ongoing radiation therapy  -sickle cell disease  -skin reactions to acrylic adhesives (On-Body Injector only)  -an unusual or allergic reaction to pegfilgrastim, filgrastim, other medicines, foods, dyes, or preservatives  -pregnant or trying to get pregnant  -breast-feeding  How should I use this medicine?  This medicine is for injection under the skin. If you get this medicine at home, you will be taught how to prepare and give the pre-filled syringe or how to use the On-body Injector. Refer to the patient Instructions for Use for detailed instructions. Use exactly as directed. Tell your healthcare provider immediately if you suspect that the On-body Injector may not have performed as intended or if you suspect the use of the On-body Injector resulted in a missed or partial dose.  It is important that you put your used needles and syringes in a special sharps container. Do not put them in a trash can. If you do not have a sharps container, call your pharmacist or healthcare provider to get one.  Talk to your pediatrician regarding the use of this medicine in children. While this drug may be prescribed for  selected conditions, precautions do apply.  Overdosage: If you think you have taken too much of this medicine contact a poison control center or emergency room at once.  NOTE: This medicine is only for you. Do not share this medicine with others.  What if I miss a dose?  It is important not to miss your dose. Call your doctor or health care professional if you miss your dose. If you miss a dose due to an On-body Injector failure or leakage, a new dose should be administered as soon as possible using a single prefilled syringe for manual use.  What may interact with this medicine?  Interactions have not been studied.  Give your health care provider a list of all the medicines, herbs, non-prescription drugs, or dietary supplements you use. Also tell them if you smoke, drink alcohol, or use illegal drugs. Some items may interact with your medicine.  This list may not describe all possible interactions. Give your health care provider a list of all the medicines, herbs, non-prescription drugs, or dietary supplements you use. Also tell them if you smoke, drink alcohol, or use illegal drugs. Some items may interact with your medicine.  What should I watch for while using this medicine?  You may need blood work done while you are taking this medicine.  If you are going to need a MRI, CT scan, or other procedure, tell your doctor that you are using this medicine (On-Body Injector only).  What side effects may I notice from receiving this medicine?  Side effects that you should report to   your doctor or health care professional as soon as possible:  -allergic reactions like skin rash, itching or hives, swelling of the face, lips, or tongue  -back pain  -dizziness  -fever  -pain, redness, or irritation at site where injected  -pinpoint red spots on the skin  -red or dark-brown urine  -shortness of breath or breathing problems  -stomach or side pain, or pain at the shoulder  -swelling  -tiredness  -trouble passing urine or  change in the amount of urine  Side effects that usually do not require medical attention (report to your doctor or health care professional if they continue or are bothersome):  -bone pain  -muscle pain  This list may not describe all possible side effects. Call your doctor for medical advice about side effects. You may report side effects to FDA at 1-800-FDA-1088.  Where should I keep my medicine?  Keep out of the reach of children.  If you are using this medicine at home, you will be instructed on how to store it. Throw away any unused medicine after the expiration date on the label.  NOTE: This sheet is a summary. It may not cover all possible information. If you have questions about this medicine, talk to your doctor, pharmacist, or health care provider.   2019 [employer] (2017-08-03 16:57:08)

## 2018-09-27 NOTE — Progress Notes (Signed)
Stephanie Combs   [phone] [phone]   Clinic Follow up Note   Patient Care Team: [name], MD as PCP - General (Internal Medicine) Herminio Commons, MD as PCP - Cardiology (Cardiology) Serena Colonel, RN as [place] Management  Date of Service:  09/27/2018  CHIEF COMPLAINT: F/u of right colon cancer and Small cell lung cancer   SUMMARY OF ONCOLOGIC HISTORY: Oncology History   Cancer Staging Primary cancer of hepatic flexure of colon [facility]) Staging form: Colon and Rectum, AJCC 8th Edition - Clinical stage from 09/09/2018: Stage Unknown (cTX, cN0, cM0) - Signed by Truitt Merle, MD on 09/28/2018       Primary cancer of hepatic flexure of colon [facility])    Initial Diagnosis    Malignant neoplasm of hepatic flexure ([place])    09/09/2018 Procedure    Coloscopy by Dr Gala Romney 09/09/18  IMPRESSION -Colon mass as described in the vicinity of the hepatic flexure - status post biopsy and inking. Polyps associated with this tumor scattered all the way to the cecum were not removed; multiple splenic flexure and rectal polyps were removed as described above. Left-sided diverticulosis.  Upper endoscopy By Dr Gala Romney 09/09/18  IMPRESSION -Abnormal distal esophagus suspicious for Barrett's esophagus?biopsied. - Small hiatal hernia. - Erosive gastropathy. Biopsied. -Duodenal bulbar erosions    09/09/2018 Initial Biopsy    Diagnosis 09/09/18 1. Stomach, biopsy - GASTRIC ANTRAL MUCOSA WITH MILD NONSPECIFIC REACTIVE GASTROPATHY - GASTRIC OXYNTIC MUCOSA WITH PARIETAL CELL HYPERPLASIA AS CAN BE SEEN IN HYPERGASTRINEMIC STATES SUCH AS PROPER PATIENT IDENTIFICATION THERAPY. - WARTHIN STARRY STAIN IS NEGATIVE FOR HELICOBACTER PYLORI 2. Esophagus, biopsy - BARRETT'S ESOPHAGUS, NEGATIVE FOR DYSPLASIA 3. Colon, polyp(s), descending - TUBULAR ADENOMA - NEGATIVE FOR HIGH-GRADE DYSPLASIA OR MALIGNANCY 4. Colon, biopsy, hepatic flexure -  ADENOCARCINOMA. SEE NOTE 5. Colon, polyp(s), splenic flexure - ADENOCARCINOMA. SEE NOTE - OTHER FRAGMENTS OF TUBULAR ADENOMA(S) WITH ONE SHOWING HIGH-GRADE DYSPLASIA 6. Rectum, polyp(s) - TUBULAR ADENOMA(S) - NEGATIVE FOR HIGH-GRADE DYSPLASIA OR MALIGNANCY    09/09/2018 Cancer Staging    Staging form: Colon and Rectum, AJCC 8th Edition - Clinical stage from 09/09/2018: Stage Unknown (cTX, cN0, cM0) - Signed by Truitt Merle, MD on 09/28/2018    09/11/2018 Imaging    CT CAP 09/11/18  IMPRESSION: 1. Central perihilar left upper lobe 5.4 cm lung mass, new since 01/27/2017 chest CT, compatible with malignancy, favor primary bronchogenic carcinoma. Postobstructive pneumonia/atelectasis in the lingula. 2. Mediastinal adenopathy in the left prevascular/AP window and subcarinal stations, compatible with metastatic nodes (more likely originating from the left upper lobe lung mass). 3. Trace dependent left pleural effusion. 4. Apple-core lesion in the ascending colon, compatible with known primary colonic neoplasm. Adjacent top-normal size right mesenteric lymph node is equivocal for locoregional nodal metastasis. No additional potential findings of metastatic disease in the abdomen or pelvis. 5. Chronic findings include: Aortic Atherosclerosis (ICD10-I70.0). Borderline mild cardiomegaly. One vessel coronary atherosclerosis. Dilated main pulmonary artery, suggesting pulmonary arterial hypertension. Left adrenal adenoma. Punctate bilateral nephrolithiasis. Small hiatal hernia.    09/20/2018 Imaging    Brain MRI 09/20/18  IMPRESSION: 1. Motion degraded examination without brain metastases identified. 2. Small skull metastases not excluded. 3. Moderate to severe chronic small vessel ischemic disease.     Small cell lung cancer, left upper lobe ([place])   09/11/2018 Imaging    CT CAP 09/11/18  IMPRESSION: 1. Central perihilar left upper lobe 5.4 cm lung mass, new since 01/27/2017 chest CT, compatible  with malignancy, favor primary bronchogenic carcinoma. Postobstructive pneumonia/atelectasis in the lingula. 2. Mediastinal adenopathy in the left prevascular/AP window and subcarinal stations, compatible with metastatic nodes (more likely originating from the left upper lobe lung mass). 3. Trace dependent left pleural effusion. 4. Apple-core lesion in the ascending colon, compatible with known primary colonic neoplasm. Adjacent top-normal size right mesenteric lymph node is equivocal for locoregional nodal metastasis. No additional potential findings of metastatic disease in the abdomen or pelvis. 5. Chronic findings include: Aortic Atherosclerosis (ICD10-I70.0). Borderline mild cardiomegaly. One vessel coronary atherosclerosis. Dilated main pulmonary artery, suggesting pulmonary arterial hypertension. Left adrenal adenoma. Punctate bilateral nephrolithiasis. Small hiatal hernia.     09/14/2018 Initial Biopsy    Diagnosis 5/5.20 Lung, biopsy, LUL - SMALL CELL CARCINOMA.    09/15/2018 Initial Diagnosis    Small cell lung cancer, left upper lobe ([place])    09/18/2018 -  Chemotherapy    carboplatin and etoposide every 4 weeks for 4 cycles starting 09/18/18. Start concurrent chemoRt on 10/11/18.     09/20/2018 Imaging    Brain MRI 09/20/18  IMPRESSION: 1. Motion degraded examination without brain metastases identified. 2. Small skull metastases not excluded. 3. Moderate to severe chronic small vessel ischemic disease.      Radiation Therapy    Pending Concurrent chemoRT starting 10/11/18      CURRENT THERAPY:  Carboplatin and etoposide every 4 weeks for 4 cycles starting 09/18/18. PENDING the start of concurrent chemoRt on 10/11/18.   INTERVAL HISTORY:  Stephanie Combs is here for a follow up. She notes she has been having a hard time at home and feels she needs a nurse aid at home to help her. She has trouble lifting her leg to get in bed due to significant LE edema. She notes she tries to  eat well. She checks her BG before going to bed.  Her daughter lives 4 miles away from her who checks in on her. She also has a friend and a neighbor who checks up on her as well. She notes her constipation has resolved currently. She reduced miralax to prevent diarrhea. She has been taking gabapentin. She feels she drinks 4 bottles of water a day.  She takes Percocet 4 times day due for back pain and abdominal pain. She usually gets shots in her back for her RA.  She notes her transportation with her friend also has his own doctor visits so she needs her appoints made around his appointments. She is flustered about her diagnosis and keeping up with these new changes and schedule. She was confused her new regimen schedule. She feels depressed lately and has Celebrex. She notes her stomach feels full, no current pain. She notes her weight usually fluctuates from 279-309. She feels her body weight has increased with fluid.  She notes her daughter is her POA and has a will in place. She has papers drawn and will bring it. She agrees with DNR/DNI if her heart stops beating.   REVIEW OF SYSTEMS:   Constitutional: Denies fevers, chills or abnormal weight loss (+) Fatigue  Eyes: Denies blurriness of vision Ears, nose, mouth, throat, and face: Denies mucositis or sore throat Respiratory: Denies cough, dyspnea or wheezes Cardiovascular: Denies palpitation, chest discomfort (+) Significant lower extremity swelling Gastrointestinal:  Denies nausea, heartburn or change in bowel habits Skin: Denies abnormal skin rashes MSK: (+) Rheumatoid arthritis in her back  Lymphatics: Denies new lymphadenopathy or easy bruising Neurological:Denies numbness, tingling or new weaknesses (+) Depression and anxiety  Behavioral/Psych: Mood is stable, no new changes  All other systems were reviewed with the patient and are negative.  MEDICAL HISTORY:  Past Medical History:  Diagnosis Date   Anxiety    CAD (coronary artery  disease)    a. s/p DES to LAD and angioplasty to D1 in 05/2017   CHF (congestive heart failure) (HCC)    Diastolic   Chronic back pain    Chronic pain    COPD (chronic obstructive pulmonary disease) (HCC)    Degenerative disk disease    Diabetes mellitus without complication ([place])    History of medication noncompliance 11/2017   "the patient frequently self-adjusts medications without notifying her physicians"   Hypertension    Hypothyroidism    On home O2    2.5 L N/C prn   Pedal edema     SURGICAL HISTORY: Past Surgical History:  Procedure Laterality Date   BIOPSY  09/09/2018   Procedure: BIOPSY;  Surgeon: Daneil Dolin, MD;  [facility];  Service: Endoscopy;;  gastric esophageal hepatic flexure colon   COLONOSCOPY WITH PROPOFOL N/A 09/09/2018   Procedure: COLONOSCOPY WITH PROPOFOL;  Surgeon: Daneil Dolin, MD;  [facility];  Service: Endoscopy;  Laterality: N/A;   CORONARY STENT INTERVENTION N/A 05/22/2017   Procedure: CORONARY STENT INTERVENTION;  Surgeon: Martinique, Peter M, MD;  Location: [facility];  Service: Cardiovascular;  Laterality: N/A;   ESOPHAGOGASTRODUODENOSCOPY (EGD) WITH PROPOFOL N/A 09/09/2018   Procedure: ESOPHAGOGASTRODUODENOSCOPY (EGD) WITH PROPOFOL;  Surgeon: Daneil Dolin, MD;  [facility];  Service: Endoscopy;  Laterality: N/A;   HERNIA REPAIR     POLYPECTOMY  09/09/2018   Procedure: POLYPECTOMY;  Surgeon: Daneil Dolin, MD;  [facility];  Service: Endoscopy;;  colon   RIGHT/LEFT HEART CATH AND CORONARY ANGIOGRAPHY N/A 05/19/2017   Procedure: RIGHT/LEFT HEART CATH AND CORONARY ANGIOGRAPHY;  Surgeon: Leonie Man, MD;  Location: [facility];  Service: Cardiovascular;  Laterality: N/A;   TUBAL LIGATION     VIDEO BRONCHOSCOPY WITH ENDOBRONCHIAL ULTRASOUND Left 09/14/2018   Procedure: VIDEO BRONCHOSCOPY WITH ENDOBRONCHIAL ULTRASOUND with biopsies;  Surgeon: Margaretha Seeds,  MD;  Location: [facility];  Service: Thoracic;  Laterality: Left;    I have reviewed the social history and family history with the patient and they are unchanged from previous note.  ALLERGIES:  is allergic to dilaudid [hydromorphone hcl]; midodrine hcl; actifed cold-allergy [chlorpheniramine-phenylephrine]; doxycycline; other; penicillins; reglan [metoclopramide]; valium [diazepam]; and vistaril [hydroxyzine hcl].  MEDICATIONS:  Current Outpatient Medications  Medication Sig Dispense Refill   albuterol (PROAIR HFA) 108 (90 Base) MCG/ACT inhaler Inhale 2 puffs into the lungs every 4 (four) hours as needed for wheezing or shortness of breath. For shortness of breath 18 g 0   albuterol (PROVENTIL) (2.5 MG/3ML) 0.083% nebulizer solution Take 2.5 mg by nebulization 2 (two) times daily as needed for wheezing or shortness of breath.      allopurinol (ZYLOPRIM) 300 MG tablet Take 1 tablet (300 mg total) by mouth daily for 15 days. 15 tablet 0   ALPRAZolam (XANAX) 1 MG tablet Take 1 mg by mouth 3 (three) times daily as needed for anxiety or sleep.      aspirin EC 81 MG tablet Take 81 mg by mouth daily.     atorvastatin (LIPITOR) 80 MG tablet Take 1 tablet (80 mg total) by mouth daily at 6 PM. (Patient taking differently: Take 80 mg by mouth every morning. )  30 tablet 0   Cholecalciferol (VITAMIN D3) 5000 units CAPS Take 1 capsule (5,000 Units total) by mouth daily. 90 capsule 0   docusate sodium (COLACE) 100 MG capsule Take 3 capsules (300 mg total) by mouth daily for 30 days. 90 capsule 0   furosemide (LASIX) 40 MG tablet Take 1 tablet (40 mg total) by mouth 2 (two) times daily. 60 tablet 0   gabapentin (NEURONTIN) 600 MG tablet Take 600 mg by mouth 4 (four) times daily.     Hydrocortisone (GERHARDT'S BUTT CREAM) CREA Apply 1 application topically 3 (three) times daily. 90 each 0   insulin aspart (NOVOLOG) 100 UNIT/ML injection Inject 1-18 Units into the skin 3 (three) times daily with meals.  (Patient taking differently: Inject 1-20 Units into the skin 3 (three) times daily with meals. )     insulin detemir (LEVEMIR) 100 UNIT/ML injection Inject 0.4 mLs (40 Units total) into the skin at bedtime. 10 mL 0   levothyroxine (SYNTHROID, LEVOTHROID) 200 MCG tablet Take 1 tablet by mouth daily.     nitroGLYCERIN (NITROSTAT) 0.4 MG SL tablet Place 0.4 mg under the tongue every 5 (five) minutes as needed for chest pain.      nystatin (MYCOSTATIN/NYSTOP) powder Apply topically 2 (two) times daily. 15 g 0   Omega-3 Fatty Acids (FISH OIL PO) Take 1 capsule by mouth daily.      ondansetron (ZOFRAN) 8 MG tablet Take 1 tablet (8 mg total) by mouth every 8 (eight) hours as needed for nausea or vomiting. 120 tablet 0   oxyCODONE-acetaminophen (PERCOCET) 10-325 MG tablet Take 1 tablet by mouth every 6 (six) hours as needed for pain.     OXYGEN Inhale 2 L into the lungs continuous.     pantoprazole (PROTONIX) 40 MG tablet Take 1 tablet (40 mg total) by mouth 2 (two) times daily. 180 tablet 3   polyethylene glycol (MIRALAX / GLYCOLAX) 17 g packet Take 17 g by mouth 2 (two) times daily. 14 each 0   potassium chloride (MICRO-K) 10 MEQ CR capsule Take 2 capsules (20 mEq total) by mouth 2 (two) times daily. *May take one additional tablet as needed for cramping 120 capsule 3   senna (SENOKOT) 8.6 MG TABS tablet Take 2 tablets (17.2 mg total) by mouth 2 (two) times daily with a meal. 120 each 0   SURE COMFORT INS SYR 1CC/28G 28G X 1/2" 1 ML MISC USE TO INJECT INSULIN UP TO 4 TIMES DAILY. 150 each 5   tiZANidine (ZANAFLEX) 4 MG tablet Take 1 tablet (4 mg total) by mouth every 8 (eight) hours as needed for muscle spasms. 30 tablet 0   topiramate (TOPAMAX) 50 MG tablet Take 150 mg by mouth every evening.      umeclidinium-vilanterol (ANORO ELLIPTA) 62.5-25 MCG/INH AEPB Inhale 1 puff into the lungs daily. (Patient taking differently: Inhale 1 puff into the lungs daily as needed (for shortness of  breath). ) 30 each 0   No current facility-administered medications for this visit.     PHYSICAL EXAMINATION: ECOG PERFORMANCE STATUS: 3 - Symptomatic, >50% confined to bed  Vitals:   09/27/18 1503  BP: (!) 149/72  Pulse: 90  Resp: 18  Temp: 99.1 F (37.3 C)  SpO2: 93%   Filed Weights   09/27/18 1503  Weight: (!) 316 lb 1.6 oz (143.4 kg)    GENERAL:alert, no distress and comfortable SKIN: skin color, texture, turgor are normal, no rashes or significant lesions EYES: normal, Conjunctiva are pink  and non-injected, sclera clear OROPHARYNX:no exudate, no erythema and lips, buccal mucosa, and tongue normal  NECK: supple, thyroid normal size, non-tender, without nodularity LYMPH:  no palpable lymphadenopathy in the cervical, axillary or inguinal LUNGS: clear to auscultation and percussion with normal breathing effort HEART: regular rate & rhythm and no murmurs (+) Significant b/l lower extremity edema ABDOMEN:abdomen soft, non-tender and normal bowel sounds Musculoskeletal:no cyanosis of digits and no clubbing  NEURO: alert & oriented x 3 with fluent speech, no focal motor/sensory deficits  LABORATORY DATA:  I have reviewed the data as listed CBC Latest Ref Rng & Units 09/27/2018 09/22/2018 09/21/2018  WBC 4.0 - 10.5 K/uL 2.7(L) - 6.2  Hemoglobin 12.0 - 15.0 g/dL 7.9(L) 8.5(L) 7.1(L)  Hematocrit 36.0 - 46.0 % 27.2(L) 29.0(L) 24.2(L)  Platelets 150 - 400 K/uL 159 - 238     CMP Latest Ref Rng & Units 09/27/2018 09/22/2018 09/21/2018  Glucose 70 - 99 mg/dL 184(H) 131(H) 251(H)  BUN 8 - 23 mg/dL 29(H) 59(H) 53(H)  Creatinine 0.44 - 1.00 mg/dL 1.35(H) 1.46(H) 1.31(H)  Sodium 135 - 145 mmol/L 141 134(L) 135  Potassium 3.5 - 5.1 mmol/L 4.5 4.0 4.4  Chloride 98 - 111 mmol/L 106 101 101  CO2 22 - 32 mmol/L _0 Calcium 8.9 - 10.3 mg/dL 8.3(L) 8.0(L) 8.3(L)  Total Protein 6.5 - 8.1 g/dL 6.0(L) 5.6(L) 5.6(L)  Total Bilirubin 0.3 - 1.2 mg/dL 0.3 0.6 0.6  Alkaline Phos 38 - 126 U/L  96 75 76  AST 15 - 41 U/L 13(L) 15 13(L)  ALT 0 - 44 U/L _1 RADIOGRAPHIC STUDIES: I have personally reviewed the radiological images as listed and agreed with the findings in the report. No results found.   ASSESSMENT & PLAN:  DAJANIQUE ROBLEY is a 68 y.o. female with    1. Small Cell Lung cancer, LUL, cT3N2M0, stage III,  limited stage  -Newly diagnosed during her work up for colon cancer  -I previously discussed her lung cancer would take priority in terms of treatment given the aggressiveness of small cell lung cancer, and very high risk of recurrence even with curative intent concurrent chemoradiation.  -We discussed treatment including chemotherapy consisting of carboplatin and etoposide every 3 weeks and concurrent radiation. She started first cycle as inpatient on 09/18/18. Chemo dose reduced for cycle 1. She tolerated first cycle chemo moderately well with fatigue, intermittent nausea, no vomiting.  -the goal of therapy is curative. 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 -Plans to start concurrent chemoRT with cycle 2 chemo on 10/11/18.  -I anticipate she will have significant complications fron concurrent chemoRT due to her comorbilities and limited PS. She expressed her strong wishes to have most effective treatments for her cancers, and voiced good understanding of the risks involved.  -Labs today show CBC WNL except WBC 2.7, Hg 7.9, ANC 1.3. CMP Cr 1.3, BG 184. Will proceed with Neulasta today to reduced her risk of infection -f/u next week    2. 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  -Unfortunately she is not a candidate for colon cancer surgery due to very high risk of surgical mortality. She was seen by surgery during her recent hospitalization.  - I discussed the  incurable nature of her colon cancer without surgery. -Will continue to monitor  on current treatment for her SCLC. -if she recovers from chemoRT for [facility], will consider low intensity chemo such as Xeloda or Irinotecan (which works for [employer] also).  Given normal MMR, she is not a candidate for immunotherapy. Given primary right side colon cancer, not much benefit from EGFR inhibitors even if KRAS/NRAS/BRAF wild type.  -will do FO on her biopsy sample to see if she has any other targeted therapy options   3. Iron deficiency anemia due to chronic blood loss, malignancy and chemo  -She was treated with Blood transfusion and 2 doses of IV Feraheme in hospital.  -CBC shows Hg at 7.9 today (09/27/18), continue monitoring    4. CAD, CHF, HTN, significant LE edema -On Lipitor, lasix 85m BID, nitro -Continue to f/u with cardiologist.  -Her Significant LE edema remains on exam today (09/27/18), per patient she feels full. Will monitor. She will continue Lasix.    5. DM, hypothyroidism, morbid obesity  -On insulin novolog and long acting levemir. On Levothyroxine.  -She has been having episodes of hypoglycemia due to low food intake.  -I encouraged her to eat adequately as she is on inulin. Will hold long acting Levemir for now. (09/27/18)  6. Rheumatoid arthritis, chronic back pain  -On Percocet which she takes 4 times a day.  .-She is also has Gabapentin.  -She use to receive injection for her back -Pain chronic, stable. She will continue to see her RA and pain specialist.  7. Depression and anxiety -She has been more depressed with recent diagnosis of 2 cancers -She is currently on Celebrex, will continue.  -She notes she still has faith and understands her prognosis. I encouraged her to continue her faith.   8. Goal of care discussion, DNR/DNI  -We again discussed the incurable nature of her cancer, and the overall poor prognosis, especially if she does not have good response to chemotherapy or  progress on chemo -The patient understands the goal of care is palliative. -I recommend DNR/DNI, she has agreed to DRI/DNR. She has written will and has established her daughter as her POA.   9. Social Support  -She has help from her daughter, friend and neighbor  -She has home aid service at home but given her poor performance status and diagnosis she is requesting more hours for support. I will contact them to discuss her condition.    Plan:  -Lab and f/u with me or Lacie on 5/28, then weekly X6 starting on 6/1 -Chemo carboplatin on day 1 and etoposide on day 1-3, starting on 6/1, and 6/22 -FO on her colon cancer biopsy     No problem-specific Assessment & Plan notes found for this encounter.   Orders Placed This Encounter  Procedures   CBC with Differential ([place])    Standing Status:   Standing    Number of Occurrences:   100    Standing Expiration Date:   09/27/2023   C[place]    Standing Status:   Standing    Number of Occurrences:   100    Standing Expiration Date:   09/27/2023   [facility]    Schedule 1 hour injection appointment   Treatment conditions    Provider to add parameters.    Standing Status:   Standing    Number of Occurrences:   1    Order Specific Question:   Did you provide treatment parameters in the comments section?    Answer:   Yes  All questions were answered. The patient knows to call the clinic with any problems, questions or concerns. No barriers to learning was detected. I spent 30 minutes counseling the patient face to face. The total time spent in the appointment was 40 minutes and more than 50% was on counseling and review of test results     Truitt Merle, MD 09/27/2018   I, Joslyn Devon, am acting as scribe for Truitt Merle, MD.   I have reviewed the above documentation for accuracy and completeness, and I agree with the above.

## 2018-09-27 NOTE — Progress Notes (Signed)
Faxed order to [facility] in [place] at [phone] to increase her home health aide the maximum number of hours.

## 2018-09-28 ENCOUNTER — Telehealth: Payer: Self-pay | Admitting: *Deleted

## 2018-09-28 ENCOUNTER — Encounter: Payer: Self-pay | Admitting: Hematology

## 2018-09-28 ENCOUNTER — Other Ambulatory Visit: Payer: Self-pay | Admitting: *Deleted

## 2018-09-28 ENCOUNTER — Telehealth: Payer: Self-pay | Admitting: Hematology

## 2018-09-28 NOTE — Telephone Encounter (Signed)
[name], could you take a look at her case to see how we can get her more home aid hours? Thanks much.   Truitt Merle MD

## 2018-09-28 NOTE — Patient Outreach (Signed)
[facility]) Care Management  09/28/2018  Stephanie Combs 06-Dec-1950 [id number]   Subjective: Received telephone call from Stephanie Combs at [facility]) states she spoke with Stephanie Combs at [facility], who stated that [facility] attempted to do start of care  09/25/2018 -09/26/2018, and patient refused visit.  Stephanie Combs has arranged for [name] RN to attempt to do start of care on 09/29/2018 with patient.  Patient will be given to option to return to services with [name] when staffing available if patient request.   Stephanie Combs states she will also call patient this afternoon to advise of above plan, confirm home health services, and next steps.   Telephone call to patient's home / mobile number, spoke with patient, and HIPAA verified.  Patient remembers speaking with this RNCM, states she has received a call from Stephanie Combs, is in agreement to receive home health services through [name], start of care 09/29/2018, and will follow up Stephanie Combs [phone]) at [name] if any additional questions.  Patient verified she has contact number for [name] and will follow up if needed.  She states the refusal with [name] may have been a misunderstanding or miscommunication, has received several calls, may have gotten confused, patient apologized for the confusion,  and is in agreement to above plan.  Patient states she does not have any education material, EMMI follow up, care coordination, care management, disease monitoring, transportation, community resource, or pharmacy needs at this time.  States she is very Patent attorney of the follow up and is in agreement to receive home health services from [name].     Objective:Per KPN (Knowledge Performance Now, point of care tool) and chart review,patient hospitalized 08/30/2018 - 09/22/2018 for anemia. 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      Assessment: Received [employer] EMMI General Discharge Red Alert Flag follow up referral on 09/27/2018. Red Flag Alert Triggers times 2, Day #1, patient answered no to the following questions: Got discharge papers?Transportation to follow-up?EMMI follow up completed, RNCM has confirmed home health services have been arranged by  [name] with [facility], and no further [facility]  care management needs.     Plan:RNCM will complete case closure due to follow up completed / no care management needs.        Stephanie Combs H. Stephanie Combs, BSN, [place] Management [facility] Telephonic CM Phone: [phone] Fax: [phone]

## 2018-09-28 NOTE — Patient Outreach (Addendum)
[facility]) Care Management  09/28/2018  Stephanie Combs 1950/08/02 [id number]   Subjective: Received telephone call from [facility] (Referral Coordinator / Clinical  Manager ) at [facility] [phone]), states they were not able to provide patient services at this time due to staffing, advised to call the hospital [name] liaison [place], [phone]) to verify if referral was given to another agency for service, or referral rejection was communicated to hospital inpatient case manager. RNCM advised per chart review no documentation found regarding referral status other than [name] was notified of patient's discharge on 09/22/2018.   Telephone call to Stephanie Combs [facility] Liaison), states she is very familiar with patient, she spoke with patient on 09/23/2018, patient was advised at that time they would not be able to provide patient services at this time due to staffing issue, referral was given to Stephanie Combs at [facility], and [facility] was to contact patient to set up start of care.  Patient was also informed to notified Stephanie Combs if there were any issue or if she wanted to resume services with [name] in the future when staffing available to provide service. RNCM updated Stephanie Combs on conversation with patient earlier today, and patient's request for assistance.  Stephanie Combs states she will follow up with Stephanie Combs at [facility], resolve patient's home health service issue,  and call this RNCM back with an update.   Objective: Per KPN (Knowledge Performance Now, point of care tool) and chart review, patient hospitalized 08/30/2018 - 09/22/2018 for anemia.  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        Assessment: Received [employer] EMMI General Discharge Red Alert Flag follow up referral on 09/27/2018.   Red Flag Alert Triggers times 2, Day #1, patient answered no to the following questions: Got discharge papers?  Transportation to follow-up?  EMMI follow up completed and RNCM will follow up with [name] regarding home health status.      Plan: RNCM will follow up with [facility] regarding referral status within 2 business days, if no return call,  and update patient once update received.      Jurgen Groeneveld H. Annia Friendly, BSN, [place] Management [facility] Telephonic CM Phone: [phone] Fax: [phone]

## 2018-09-28 NOTE — Telephone Encounter (Signed)
Received vm call from [facility] stating they had received an order for [facility] for max allowed hours daily.  Informed us that services have been d/c & they only provide an aid for bathing/dressing & could not provide for services other than this.   Called pt & she states that she needs extra help &  Especially needs some help getting into bed at night.  She states that [facility] stopped b/c she was in the hospital & someone called & told her they didn't have enough nurses to send out. Informed that this may require her getting some personal services.  She was insistent that her insurance would cover this.  Suggested she call her insurance to see if they could give her some suggestions for help.   Called [facility] & spoke to [facility] & she is waiting on referral.  Requested they call us & let us know what they are able to do for this pt.

## 2018-09-28 NOTE — Telephone Encounter (Signed)
Scheduled appt per 5/18 los.  Patient aware of appt date and time.  Per patient request, a calendar will be mailed out.

## 2018-09-28 NOTE — Telephone Encounter (Signed)
Mailed schedule for May thru July.

## 2018-09-28 NOTE — Patient Outreach (Signed)
[facility] [facility]) Care Management  09/28/2018  Stephanie Combs [birth year] [id number]   Subjective:  Received in -basket message patient called and requested call back. Telephone call to patient's home number, spoke with patient, and HIPAA verified.  Discussed [place] EMMI General Discharge Red Alert Flag follow up, patient voiced understanding, and is in agreement to follow up.  Patient states she is feeling weak and tired this morning.   Patient states she remembers receiving EMMI automated calls, has discharge papers, and has transportation as needed.  Patient states she called [facility] today to get some assistance with home health services.  States she has a MD appointment on 09/27/2018 and provider was upset that her home health services had not resumed since hospital discharge.  Patient states the received a call from [facility] that they had 2 nurses out sick, were unable to provide care, and she was not sure of the status of finding another agency.  States she also may have been referred to [facility] but has not received a call from this agency.  Patient requested assistance with verification of home health status.  RNCM advised patient will follow up on home health agency status and call patient back with an update.  Telephone call to Jenny Reichmann at [facility] [phone]), states she is in [place], and advised to call local [place] office [phone]) regarding status of patient's home health services. Telephone call to Maudie Mercury at [place] [phone]) transferred to referral coordination and call disconnected. Telephone call to [facility] [place] [phone]), states Colletta [place] (referral coordinator) is currently at lunch, and will send her a message to call this RNCM back.  States patient was on service with this agency in the past, discharged on 09/06/2018 due to hospitalization, not sure if referred post discharge, and will need to  verify referral status with Colletta Maryland.      Objective: Per KPN (Knowledge Performance Now, point of care tool) and chart review, patient hospitalized 08/30/2018 - 09/22/2018 for anemia.  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       Assessment: Received [employer] EMMI General Discharge Red Alert Flag follow up referral on 09/27/2018.   Red Flag Alert Triggers times 2, Day #1, patient answered no to the following questions: Got discharge papers?  Transportation to follow-up?  EMMI follow up completed and RNCM will follow up with [name] regarding home health status.      Plan: RNCM will follow up with [facility] regarding referral status within 2 business days and update patient once update received.      Anniyah Mood H. Annia Friendly, BSN, [place] Management [facility] Telephonic CM Phone: [phone] Fax: [phone]

## 2018-09-29 ENCOUNTER — Encounter: Payer: Self-pay | Admitting: General Practice

## 2018-09-29 ENCOUNTER — Other Ambulatory Visit: Payer: Self-pay

## 2018-09-29 ENCOUNTER — Ambulatory Visit: Payer: Medicare Other | Admitting: *Deleted

## 2018-09-29 ENCOUNTER — Other Ambulatory Visit: Payer: Self-pay | Admitting: *Deleted

## 2018-09-29 DIAGNOSIS — E1140 Type 2 diabetes mellitus with diabetic neuropathy, unspecified: Secondary | ICD-10-CM | POA: Diagnosis not present

## 2018-09-29 DIAGNOSIS — N183 Chronic kidney disease, stage 3 (moderate): Secondary | ICD-10-CM | POA: Diagnosis not present

## 2018-09-29 DIAGNOSIS — E039 Hypothyroidism, unspecified: Secondary | ICD-10-CM | POA: Diagnosis not present

## 2018-09-29 DIAGNOSIS — I5031 Acute diastolic (congestive) heart failure: Secondary | ICD-10-CM | POA: Diagnosis not present

## 2018-09-29 DIAGNOSIS — D631 Anemia in chronic kidney disease: Secondary | ICD-10-CM | POA: Diagnosis not present

## 2018-09-29 NOTE — Progress Notes (Signed)
[place] CSW Progress Notes  CSW received request from MD to determine how to get patient additional hours of in home care.  Patient was discharged from hospital, referred to [name] Sharmon Revere [phone]).   for home health needs.  Agency was not able to staff case, referred to [facility] Marland KitchenJana Half [phone]).  Per patient, Jackquline Denmark is calling her today to discuss what services home health can provide to her.  Patient has spoken with Duanne L[place] [phone]) is awaiting copies of patient's last two month's bank statements in order to complete her application for limited financial assistance.    At this time, patient does not have Medicaid.  She can call [place] [phone]) and request to speak w [place] staff who can help her complete an application for Medicaid and [place]. Per patient, she is over income for Medicaid; however, her medical expenses may reduce her income sufficient to qualify her for Medicaid. Patient can complete this process by phone and mail through [place].   Mrs. Darius is still open for services but nothing has been done for her yet. We are waiting for her to send in the three most recent bank statements for all bank accounts to complete her financial eligibility. I talked with her the other day and she was still in the hospital. Has she discharged he yet?  Patient is open for services w Independent Living w [facility] [name] caseworker - [phone] or [email or web address]) [phone]    office [phone]    fax [email or web address]  This agency is looking at possibly repairing the ramp, fixing the floor in the kitchen and a bathroom modification to assist with getting safely in and out of the tub/shower.  They do have a Cytogeneticist; however, it has a long wait list.  Patient would need to find her own caregivers and  could be reimbursed through this program.  Patient can contact Sharol Roussel, [place] and Transportation Program [phone]) - she can hire personal care attendants for either $12/hour or $17/hour depending on kind of care needed.  Patient can access ADTS transportation to appointments within [facility].   for $6/ride. She will also be referred to the [facility] if possible for rides to radiation treatments.  Currently she pays privately for these services, approx $20/ride.    Spoke w patient, she has ongoing arrangement w friend who takes her to appointments, shops for groceries for her and similar.  Is not interested in help w transportation.  Called patient to discuss these options.  Patient aware that she needs to call DSS to initiate application for Medicaid and Personal Care, state she will do this tomorrow.  Unclear whether she will qualify, patient aware of this.  Declines help w transportation, prefers to pay friend to drive her.  Aware of ways to lower transport cost by using [place] transportation and [employer], "I dont want to ride a Lucianne Lei."  States that "someone has been out here to figure out what needs to be done in my house to fix it - I am next on the list."  Does not know name of agency.  CSW will ask [name] to call her to clarify that she has not completed eligibility for Independent Living program assistance, needs to provide bank statements.  [name] can also place patient on wait list for their [place] reimbursement program - there is a wait list for services.  Patient  also aware of ADTS/Kathy Jake Seats who can provide companion type aide at a cost to patient - patient has spoken w her in past and doubts they have anyone available to staff her case. CSW encouraged her to call again as things may have changed.  Patient still awaiting call from [facility] - MD and desk RN advised that patient has not heard from home health  agency yet.  PAtient states daughter has mailed bank statement to [employer] for their limited assistance program.    CSW signing off at this point - patient declines referral to transportation and ADTS. Will ask Independent Living program to call and clarify her status w their program.  Pt declines consent for CSW to speak w her daughter to further clarify resources/needs.    Edwyna Shell, LCSW Clinical Social Worker Phone:  [phone]

## 2018-09-29 NOTE — Patient Outreach (Signed)
[place] [facility]) Care Management  09/29/2018  Stephanie Combs 1951/01/23 [id number]   Received voicemail message and in basket message times 2 ,from Edwyna Shell Social Worker at  [employer] center.  Telephone call to [name] earlier today,  discussed our conversations with patient and patient's current status with [facility] Care Management.  RNCM will discuss case with [place] Management Nurse Practitioner  (Caroll Spinks ) and Social Worker Financial planner) to see if any additional resources are available, and send update to [employer].    Miles Borkowski H. Annia Friendly, BSN, [place] Management [facility] Telephonic CM Phone: [phone] Fax: [phone]

## 2018-09-29 NOTE — Progress Notes (Signed)
Spoke with [name] with [facility] regarding services for this patient.  She states that the patient originally refused their services because she thought [facility] who she currently had was going to be able to staff her needs.  They are contacting the patient today after 2:00 to try to start her services.

## 2018-09-30 ENCOUNTER — Other Ambulatory Visit: Payer: Self-pay | Admitting: *Deleted

## 2018-09-30 NOTE — Patient Outreach (Addendum)
[place] [facility]) Care Management  09/30/2018  Stephanie Combs 01-Jan-1951 [id number]   Left voicemail for [place] Nurse Practitioner at [place] Management and requested call back.  Case discussed with Clinical research associate at [facility], no additional resources available at this time, see chart for resources identified during previous program participation.  Case discussed with Benard Rink Nurse Practitioner at [facility] Management, no additional resources available at this time, see chart for resources identified during previous program participation. Telephone call to [name] [phone]) at [facility], states patient was scheduled to be seen today, patient requested call back from [facility] on 09/29/2018 after 2:00pm to schedule visit for 09/30/2018, and no documentation in [facility] system at this time regarding visit.  [name] will contact this RNCM to confirm status of patient's start of care visit.    RNCM will contact [name] at [facility] for update if no call back by end of business on 10/05/2018. Sent the following in-basket message update to [place] Worker at [employer]. Hello [name], I have spoken with patient's Care Coordinator and Social Worker during previous [place] Management program participation and confirmed that there are no additional available resources at this time.   I have also reached out to [facility], left a message, and waiting for confirmation of home health start of care visit.  Thanks, Adeel Guiffre H.  Burt Knack Therapist, sports, Copywriter, advertising, CCM  [employer]  Tri[employer]ator, Telephonic [address],  [place], [place] [address] Direct Dial: [phone]   Fax: [phone] Website:  [email or web address] Email: [name]        Marlo Goodrich H. Annia Friendly, BSN, [place] Management [facility] Telephonic CM Phone: [phone] Fax: [phone]

## 2018-10-01 ENCOUNTER — Other Ambulatory Visit: Payer: Self-pay | Admitting: *Deleted

## 2018-10-01 NOTE — Patient Outreach (Signed)
[facility]) Care Management  10/01/2018  CLYDA SMYTH [birth year] [id number]   Telephone call to [name] [phone]) at [facility], states patient was seen on 09/30/2018 for start of care and no clinical information available at this time.  Sent the following in-basket message update to Edwyna Shell Social Worker at [employer]:  Nilda Calamity,  Patient was seen by [facility] on 09/30/2018 for start of care.  Patient case will remained closed with [place] Management and no additional resources identified at this time.  Thanks,  Airabella Barley H.  Burt Knack Therapist, sports, Copywriter, advertising, CCM  [employer]  Tri[employer] Curator, Telephonic [address]  [place], [place] [address] Direct Dial: [phone]   Fax: [phone] Website:  [email or web address] Email: [email or web address]      Shravya Wickwire H. Annia Friendly, BSN, [place] Management [facility] Telephonic CM Phone: [phone] Fax: [phone]

## 2018-10-05 ENCOUNTER — Other Ambulatory Visit: Payer: Self-pay | Admitting: *Deleted

## 2018-10-05 NOTE — Patient Outreach (Signed)
[place] [facility]) Care Management  10/05/2018  Stephanie Combs 09/27/1950 [id number]   Case discussed with [place] Management Assistant Director. Patient case will remained closed with [place] Management and no additional resources identified at this time.    Stephanie Combs, BSN, [place] Management [facility] Telephonic CM Phone: [phone] Fax: [phone]

## 2018-10-06 NOTE — Progress Notes (Signed)
Center   [phone] [phone]) [phone]   Clinic Follow up Note   Patient Care Team: [name], MD as PCP - General (Internal Medicine) Herminio Commons, MD as PCP - Cardiology (Cardiology)  Date of Service:  10/07/2018  CHIEF COMPLAINT: F/u of right colon cancer and Small cell lung cancer   SUMMARY OF ONCOLOGIC HISTORY: Oncology History   Cancer Staging Primary cancer of hepatic flexure of colon [facility]) Staging form: Colon and Rectum, AJCC 8th Edition - Clinical stage from 09/09/2018: Stage Unknown (cTX, cN0, cM0) - Signed by Truitt Merle, MD on 09/28/2018  Small cell lung cancer, left upper lobe ([place]) Staging form: Lung, AJCC 8th Edition - Clinical stage from 09/14/2018: Stage IIIB (cT3, cN2, cM0) - Signed by Truitt Merle, MD on 09/28/2018       Primary cancer of hepatic flexure of colon [facility])    Initial Diagnosis    Malignant neoplasm of hepatic flexure ([place])    09/09/2018 Procedure    Coloscopy by Dr Gala Romney 09/09/18  IMPRESSION -Colon mass as described in the vicinity of the hepatic flexure - status post biopsy and inking. Polyps associated with this tumor scattered all the way to the cecum were not removed; multiple splenic flexure and rectal polyps were removed as described above. Left-sided diverticulosis.  Upper endoscopy By Dr Gala Romney 09/09/18  IMPRESSION -Abnormal distal esophagus suspicious for Barrett's esophagus?biopsied. - Small hiatal hernia. - Erosive gastropathy. Biopsied. -Duodenal bulbar erosions    09/09/2018 Initial Biopsy    Diagnosis 09/09/18 1. Stomach, biopsy - GASTRIC ANTRAL MUCOSA WITH MILD NONSPECIFIC REACTIVE GASTROPATHY - GASTRIC OXYNTIC MUCOSA WITH PARIETAL CELL HYPERPLASIA AS CAN BE SEEN IN HYPERGASTRINEMIC STATES SUCH AS PROPER PATIENT IDENTIFICATION THERAPY. - WARTHIN STARRY STAIN IS NEGATIVE FOR HELICOBACTER PYLORI 2. Esophagus, biopsy - BARRETT'S ESOPHAGUS, NEGATIVE FOR DYSPLASIA 3. Colon, polyp(s), descending  - TUBULAR ADENOMA - NEGATIVE FOR HIGH-GRADE DYSPLASIA OR MALIGNANCY 4. Colon, biopsy, hepatic flexure - ADENOCARCINOMA. SEE NOTE 5. Colon, polyp(s), splenic flexure - ADENOCARCINOMA. SEE NOTE - OTHER FRAGMENTS OF TUBULAR ADENOMA(S) WITH ONE SHOWING HIGH-GRADE DYSPLASIA 6. Rectum, polyp(s) - TUBULAR ADENOMA(S) - NEGATIVE FOR HIGH-GRADE DYSPLASIA OR MALIGNANCY    09/09/2018 Cancer Staging    Staging form: Colon and Rectum, AJCC 8th Edition - Clinical stage from 09/09/2018: Stage Unknown (cTX, cN0, cM0) - Signed by Truitt Merle, MD on 09/28/2018    09/11/2018 Imaging    CT CAP 09/11/18  IMPRESSION: 1. Central perihilar left upper lobe 5.4 cm lung mass, new since 01/27/2017 chest CT, compatible with malignancy, favor primary bronchogenic carcinoma. Postobstructive pneumonia/atelectasis in the lingula. 2. Mediastinal adenopathy in the left prevascular/AP window and subcarinal stations, compatible with metastatic nodes (more likely originating from the left upper lobe lung mass). 3. Trace dependent left pleural effusion. 4. Apple-core lesion in the ascending colon, compatible with known primary colonic neoplasm. Adjacent top-normal size right mesenteric lymph node is equivocal for locoregional nodal metastasis. No additional potential findings of metastatic disease in the abdomen or pelvis. 5. Chronic findings include: Aortic Atherosclerosis (ICD10-I70.0). Borderline mild cardiomegaly. One vessel coronary atherosclerosis. Dilated main pulmonary artery, suggesting pulmonary arterial hypertension. Left adrenal adenoma. Punctate bilateral nephrolithiasis. Small hiatal hernia.    09/20/2018 Imaging    Brain MRI 09/20/18  IMPRESSION: 1. Motion degraded examination without brain metastases identified. 2. Small skull metastases not excluded. 3. Moderate to severe chronic small vessel ischemic disease.     Small cell lung cancer, left upper lobe ([place])   09/11/2018 Imaging  CT CAP 09/11/18   IMPRESSION: 1. Central perihilar left upper lobe 5.4 cm lung mass, new since 01/27/2017 chest CT, compatible with malignancy, favor primary bronchogenic carcinoma. Postobstructive pneumonia/atelectasis in the lingula. 2. Mediastinal adenopathy in the left prevascular/AP window and subcarinal stations, compatible with metastatic nodes (more likely originating from the left upper lobe lung mass). 3. Trace dependent left pleural effusion. 4. Apple-core lesion in the ascending colon, compatible with known primary colonic neoplasm. Adjacent top-normal size right mesenteric lymph node is equivocal for locoregional nodal metastasis. No additional potential findings of metastatic disease in the abdomen or pelvis. 5. Chronic findings include: Aortic Atherosclerosis (ICD10-I70.0). Borderline mild cardiomegaly. One vessel coronary atherosclerosis. Dilated main pulmonary artery, suggesting pulmonary arterial hypertension. Left adrenal adenoma. Punctate bilateral nephrolithiasis. Small hiatal hernia.     09/14/2018 Initial Biopsy    Diagnosis 5/5.20 Lung, biopsy, LUL - SMALL CELL CARCINOMA.    09/14/2018 Cancer Staging    Staging form: Lung, AJCC 8th Edition - Clinical stage from 09/14/2018: Stage IIIB (cT3, cN2, cM0) - Signed by Truitt Merle, MD on 09/28/2018    09/15/2018 Initial Diagnosis    Small cell lung cancer, left upper lobe ([place])    09/18/2018 -  Chemotherapy    carboplatin and etoposide every 4 weeks for 4 cycles starting 09/18/18. Start concurrent chemoRt on 10/11/18.     09/20/2018 Imaging    Brain MRI 09/20/18  IMPRESSION: 1. Motion degraded examination without brain metastases identified. 2. Small skull metastases not excluded. 3. Moderate to severe chronic small vessel ischemic disease.      Radiation Therapy    Pending Concurrent chemoRT starting 10/11/18      CURRENT THERAPY:  Carboplatin and etoposide every 4 weeks for 4 cycles starting 09/18/18. PENDING the start of concurrent  chemoRt on 10/11/18.   INTERVAL HISTORY:  Stephanie Combs is here for a follow up. She presents to the clinic alone.  She is very upset because she has lost most of her here.  She complains of worsening leg edema, and fluids overloaded.  Her dyspnea on exertion is stable, she is able to move around at home, but not physically active.  Appetite is decent, no significant nausea. Overall stable.     REVIEW OF SYSTEMS:   Constitutional: Denies fevers, chills or abnormal weight loss, (+) fatigue and weight gain  Eyes: Denies blurriness of vision Ears, nose, mouth, throat, and face: Denies mucositis or sore throat Respiratory: (+) dyspnea on mild exertion, mild dry cough  Cardiovascular: Denies palpitation, chest discomfort or lower extremity swelling Gastrointestinal:  Denies nausea, heartburn or change in bowel habits Skin: Denies abnormal skin rashes Lymphatics: Denies new lymphadenopathy, (+) worsening leg edema  Neurological:Denies numbness, tingling or new weaknesses Behavioral/Psych: Mood is stable, no new changes  All other systems were reviewed with the patient and are negative.  MEDICAL HISTORY:  Past Medical History:  Diagnosis Date  . Anxiety   . CAD (coronary artery disease)    a. s/p DES to LAD and angioplasty to D1 in 05/2017  . CHF (congestive heart failure) (H[facility])    Diastolic  . Chronic back pain   . Chronic pain   . COPD (chronic obstructive pulmonary disease) ([place])   . Degenerative disk disease   . Diabetes mellitus without complication ([place])   . History of medication noncompliance 11/2017   "the patient frequently self-adjusts medications without notifying her physicians"  . Hypertension   . Hypothyroidism   . On home O2    2.5  L N/C prn  . Pedal edema     SURGICAL HISTORY: Past Surgical History:  Procedure Laterality Date  . BIOPSY  09/09/2018   Procedure: BIOPSY;  Surgeon: Daneil Dolin, MD;  [facility];  Service: Endoscopy;;  gastric  esophageal hepatic flexure colon  . COLONOSCOPY WITH PROPOFOL N/A 09/09/2018   Procedure: COLONOSCOPY WITH PROPOFOL;  Surgeon: Daneil Dolin, MD;  [facility];  Service: Endoscopy;  Laterality: N/A;  . CORONARY STENT INTERVENTION N/A 05/22/2017   Procedure: CORONARY STENT INTERVENTION;  Surgeon: Martinique, Peter M, MD;  Location: [place] [facility];  Service: Cardiovascular;  Laterality: N/A;  . ESOPHAGOGASTRODUODENOSCOPY (EGD) WITH PROPOFOL N/A 09/09/2018   Procedure: ESOPHAGOGASTRODUODENOSCOPY (EGD) WITH PROPOFOL;  Surgeon: Daneil Dolin, MD;  [facility];  Service: Endoscopy;  Laterality: N/A;  . HERNIA REPAIR    . POLYPECTOMY  09/09/2018   Procedure: POLYPECTOMY;  Surgeon: Daneil Dolin, MD;  [facility];  Service: Endoscopy;;  colon  . RIGHT/LEFT HEART CATH AND CORONARY ANGIOGRAPHY N/A 05/19/2017   Procedure: RIGHT/LEFT HEART CATH AND CORONARY ANGIOGRAPHY;  Surgeon: Leonie Man, MD;  Location: [place] [facility];  Service: Cardiovascular;  Laterality: N/A;  . TUBAL LIGATION    . VIDEO BRONCHOSCOPY WITH ENDOBRONCHIAL ULTRASOUND Left 09/14/2018   Procedure: VIDEO BRONCHOSCOPY WITH ENDOBRONCHIAL ULTRASOUND with biopsies;  Surgeon: Margaretha Seeds, MD;  Location: [place];  Service: Thoracic;  Laterality: Left;    I have reviewed the social history and family history with the patient and they are unchanged from previous note.  ALLERGIES:  is allergic to dilaudid [hydromorphone hcl]; midodrine hcl; actifed cold-allergy [chlorpheniramine-phenylephrine]; doxycycline; other; penicillins; reglan [metoclopramide]; valium [diazepam]; and vistaril [hydroxyzine hcl].  MEDICATIONS:  Current Outpatient Medications  Medication Sig Dispense Refill  . albuterol (PROAIR HFA) 108 (90 Base) MCG/ACT inhaler Inhale 2 puffs into the lungs every 4 (four) hours as needed for wheezing or shortness of breath. For shortness of breath 18 g 0  . albuterol (PROVENTIL) (2.5 MG/3ML)  0.083% nebulizer solution Take 2.5 mg by nebulization 2 (two) times daily as needed for wheezing or shortness of breath.     . ALPRAZolam (XANAX) 1 MG tablet Take 1 mg by mouth 3 (three) times daily as needed for anxiety or sleep.     Marland Kitchen aspirin EC 81 MG tablet Take 81 mg by mouth daily.    Marland Kitchen atorvastatin (LIPITOR) 80 MG tablet Take 1 tablet (80 mg total) by mouth daily at 6 PM. (Patient taking differently: Take 80 mg by mouth every morning. ) 30 tablet 0  . Cholecalciferol (VITAMIN D3) 5000 units CAPS Take 1 capsule (5,000 Units total) by mouth daily. 90 capsule 0  . docusate sodium (COLACE) 100 MG capsule Take 3 capsules (300 mg total) by mouth daily for 30 days. 90 capsule 0  . furosemide (LASIX) 40 MG tablet Take 1 tablet (40 mg total) by mouth 2 (two) times daily. 60 tablet 0  . gabapentin (NEURONTIN) 600 MG tablet Take 600 mg by mouth 4 (four) times daily.    . Hydrocortisone (GERHARDT'S BUTT CREAM) CREA Apply 1 application topically 3 (three) times daily. 90 each 0  . insulin aspart (NOVOLOG) 100 UNIT/ML injection Inject 1-18 Units into the skin 3 (three) times daily with meals. (Patient taking differently: Inject 1-20 Units into the skin 3 (three) times daily with meals. )    . insulin detemir (LEVEMIR) 100 UNIT/ML injection Inject 0.4 mLs (40 Units  total) into the skin at bedtime. 10 mL 0  . levothyroxine (SYNTHROID, LEVOTHROID) 200 MCG tablet Take 1 tablet by mouth daily.    . nitroGLYCERIN (NITROSTAT) 0.4 MG SL tablet Place 0.4 mg under the tongue every 5 (five) minutes as needed for chest pain.     Marland Kitchen nystatin (MYCOSTATIN/NYSTOP) powder Apply topically 2 (two) times daily. 15 g 0  . Omega-3 Fatty Acids (FISH OIL PO) Take 1 capsule by mouth daily.     . ondansetron (ZOFRAN) 8 MG tablet Take 1 tablet (8 mg total) by mouth every 8 (eight) hours as needed for nausea or vomiting. 120 tablet 0  . oxyCODONE-acetaminophen (PERCOCET) 10-325 MG tablet Take 1 tablet by mouth every 6 (six) hours as  needed for pain.    . OXYGEN Inhale 2 L into the lungs continuous.    . polyethylene glycol (MIRALAX / GLYCOLAX) 17 g packet Take 17 g by mouth 2 (two) times daily. 14 each 0  . potassium chloride (MICRO-K) 10 MEQ CR capsule Take 2 capsules (20 mEq total) by mouth 2 (two) times daily. *May take one additional tablet as needed for cramping 120 capsule 3  . senna (SENOKOT) 8.6 MG TABS tablet Take 2 tablets (17.2 mg total) by mouth 2 (two) times daily with a meal. 120 each 0  . SURE COMFORT INS SYR 1CC/28G 28G X 1/2" 1 ML MISC USE TO INJECT INSULIN UP TO 4 TIMES DAILY. 150 each 5  . tiZANidine (ZANAFLEX) 4 MG tablet Take 1 tablet (4 mg total) by mouth every 8 (eight) hours as needed for muscle spasms. 30 tablet 0  . topiramate (TOPAMAX) 50 MG tablet Take 150 mg by mouth every evening.     . umeclidinium-vilanterol (ANORO ELLIPTA) 62.5-25 MCG/INH AEPB Inhale 1 puff into the lungs daily. (Patient taking differently: Inhale 1 puff into the lungs daily as needed (for shortness of breath). ) 30 each 0  . allopurinol (ZYLOPRIM) 300 MG tablet Take 1 tablet (300 mg total) by mouth daily for 15 days. 15 tablet 0  . pantoprazole (PROTONIX) 40 MG tablet Take 1 tablet (40 mg total) by mouth 2 (two) times daily. 180 tablet 3   No current facility-administered medications for this visit.     PHYSICAL EXAMINATION: ECOG PERFORMANCE STATUS: 3 - Symptomatic, >50% confined to bed  Vitals:   10/07/18 1202  BP: 110/85  Pulse: 100  Resp: 20  Temp: 99.1 F (37.3 C)  SpO2: 95%   Filed Weights   10/07/18 1251  Weight: (!) 326 lb 1.6 oz (147.9 kg)   GENERAL:alert, no distress and comfortable SKIN: skin color, texture, turgor are normal, no rashes or significant lesions EYES: normal, Conjunctiva are pink and non-injected, sclera clear NECK: supple, thyroid normal size, non-tender, without nodularity LYMPH:  no palpable lymphadenopathy in the cervical, axillary  LUNGS: clear to auscultation and percussion with  normal breathing effort HEART: regular rate & rhythm and no murmurs, (+) bilateral lower extremity edema up to knee, with mild skin erythema ABDOMEN:abdomen soft, non-tender and normal bowel sounds Musculoskeletal:no cyanosis of digits and no clubbing  NEURO: alert & oriented x 3 with fluent speech, no focal motor/sensory deficits  LABORATORY DATA:  I have reviewed the data as listed CBC Latest Ref Rng & Units 10/07/2018 09/27/2018 09/22/2018  WBC 4.0 - 10.5 K/uL 17.3(H) 2.7(L) -  Hemoglobin 12.0 - 15.0 g/dL 8.5(L) 7.9(L) 8.5(L)  Hematocrit 36.0 - 46.0 % 29.5(L) 27.2(L) 29.0(L)  Platelets 150 - 400 K/uL 251 159 -  CMP Latest Ref Rng & Units 10/07/2018 09/27/2018 09/22/2018  Glucose 70 - 99 mg/dL 137(H) 184(H) 131(H)  BUN 8 - 23 mg/dL 22 29(H) 59(H)  Creatinine 0.44 - 1.00 mg/dL 1.88(H) 1.35(H) 1.46(H)  Sodium 135 - 145 mmol/L 142 141 134(L)  Potassium 3.5 - 5.1 mmol/L 4.3 4.5 4.0  Chloride 98 - 111 mmol/L 104 106 101  CO2 22 - 32 mmol/L _0 Calcium 8.9 - 10.3 mg/dL 8.7(L) 8.3(L) 8.0(L)  Total Protein 6.5 - 8.1 g/dL 6.7 6.0(L) 5.6(L)  Total Bilirubin 0.3 - 1.2 mg/dL 0.3 0.3 0.6  Alkaline Phos 38 - 126 U/L 147(H) 96 75  AST 15 - 41 U/L 14(L) 13(L) 15  ALT 0 - 44 U/L _1 RADIOGRAPHIC STUDIES: I have personally reviewed the radiological images as listed and agreed with the findings in the report. No results found.   ASSESSMENT & PLAN:  Stephanie Combs is a 68 y.o. female with   1. Small Cell Lung cancer, LUL, cT3N2M0, stage III,  limited stage  -Newly diagnosed in 09/2018 during her work up for colon cancer  -I previously discussed her lung cancer would take priority in terms of treatment given the aggressivenessof small cell lung cancer, and very high risk of recurrence evenwith curative intentconcurrentchemoradiation.  -The goal of therapy is curative. 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  -She started first cycle chemo as inpatient on 09/18/18. Chemo dose reduced for cycle 1. She tolerated first cycle chemo moderately well with fatigue, intermittent nausea, no vomiting.  -Plan to start concurrent chemoRT with cycle 2 chemo on 10/11/18. -I again reviewed potential side effects from chemo and radiation, especially fatigue, risk of infections, dehydration, worsening her COPD and heart failure, need for hospitalization, sepsis and organ failure etc. After lengthy discussion, patient expressed strong room to try treatment, will proceed chemo and radiation on next Monday, June 1.  2. 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  -Unfortunately she is not a candidate for colon cancer surgery due to very high risk ofsurgical mortality. She was seen by surgery during her recent hospitalization.  - I discussed the incurablenature of her colon cancer without surgery. -Will continue to monitor on current treatment for her SCLC. -if she recovers from chemoRT for [facility], will consider low intensity chemo such as Xeloda or Irinotecan (which works for [employer] also). Given normal MMR, she is not a candidate for immunotherapy. Given primary right side colon cancer, not much benefit from EGFR inhibitors even if KRAS/NRAS/BRAF wild type.  -I have ordered FO on her biopsy sample to see if she has any other targeted therapy options   3. Iron deficiency anemia due to chronic blood loss, malignancy and chemo  -She was treated with Blood transfusion and 2 doses of IV Feraheme in hospital.  -CBC shows Hg at 8.5 today (10/07/18), continue monitoring    4. CAD, CHF, HTN, significant LE edema -On Lipitor, lasix 52m  BID, nitro -Continue to f/u with cardiologist.  -due to her worsening Cr, and pt feels "too much lasix", I will decrease her lasix from 471mbid to 4055maily  -she responded well to iv lasix, may consider  iv lasix during chemo   5. DM, hypothyroidism, morbid obesity  -On insulin novolog and long acting levemir. On Levothyroxine.  -will monitor glucose closely during her chemo treatment  6. Rheumatoid arthritis, chronic back pain  -On Percocet which she takes 4 times a day.  .-She is also has Gabapentin.  -She use to receive injection for her back -Pain chronic, stable. She will continue to see her RA and pain specialist.   7. Depression and anxiety -She has been more depressed with recent diagnosis of 2 cancers -She is currently on Celebrex, will continue.  -She notes she still has faith and understands her prognosis. I encouraged her to continue her faith.   8. Goal of care discussion, DNR/DNI  -We again discussed the incurable nature of her cancer, and the overall poor prognosis, especially if she does not have good response to chemotherapy or progress on chemo -The patient understands the goal of care is palliative. -I recommend DNR/DNI, she has agreed to DRI/DNR. She has written will and has established her daughter as her POA.   9. Social Support  -She has help from her daughter, friend and neighbor  -She has home aid service at home but given her poor performance status and diagnosis she is requesting more hours for support. I will contact them to discuss her condition.    Plan:  -Lab reviewed, likely related to excessive.  Due to increased creatinine, will decrease furosemide to 40 mg once daily -start cycle 2 chemo and concurrent RT on 6/1, will monitor her weekly     No problem-specific Assessment & Plan notes found for this encounter.   No orders of the defined types were placed in this encounter.  All questions were answered. The patient knows to call the  clinic with any problems, questions or concerns. No barriers to learning was detected. I spent 20 minutes counseling the patient face to face. The total time spent in the appointment was 25 minutes and more than 50% was on counseling and review of test results     Truitt Merle, MD 10/07/2018   I, Joslyn Devon, am acting as scribe for Truitt Merle, MD.   I have reviewed the above documentation for accuracy and completeness, and I agree with the above.

## 2018-10-07 ENCOUNTER — Inpatient Hospital Stay (HOSPITAL_BASED_OUTPATIENT_CLINIC_OR_DEPARTMENT_OTHER): Payer: Medicare Other | Admitting: Hematology

## 2018-10-07 ENCOUNTER — Ambulatory Visit
Admission: RE | Admit: 2018-10-07 | Discharge: 2018-10-07 | Disposition: A | Discharge status: Home / Self Care | Payer: Medicare Other | Source: Ambulatory Visit | Attending: Radiation Oncology | Admitting: Radiation Oncology

## 2018-10-07 ENCOUNTER — Inpatient Hospital Stay: Payer: Medicare Other

## 2018-10-07 ENCOUNTER — Other Ambulatory Visit: Payer: Self-pay

## 2018-10-07 ENCOUNTER — Telehealth: Payer: Self-pay | Admitting: Hematology

## 2018-10-07 ENCOUNTER — Encounter: Payer: Self-pay | Admitting: Hematology

## 2018-10-07 VITALS — BP 110/85 | HR 100 | Temp 99.1°F | Resp 20 | Ht 61.5 in | Wt 326.1 lb

## 2018-10-07 DIAGNOSIS — I700 Atherosclerosis of aorta: Secondary | ICD-10-CM | POA: Diagnosis not present

## 2018-10-07 DIAGNOSIS — J90 Pleural effusion, not elsewhere classified: Secondary | ICD-10-CM | POA: Diagnosis not present

## 2018-10-07 DIAGNOSIS — C3490 Malignant neoplasm of unspecified part of unspecified bronchus or lung: Secondary | ICD-10-CM | POA: Diagnosis not present

## 2018-10-07 DIAGNOSIS — Z79899 Other long term (current) drug therapy: Secondary | ICD-10-CM

## 2018-10-07 DIAGNOSIS — R609 Edema, unspecified: Secondary | ICD-10-CM

## 2018-10-07 DIAGNOSIS — I110 Hypertensive heart disease with heart failure: Secondary | ICD-10-CM | POA: Diagnosis not present

## 2018-10-07 DIAGNOSIS — I517 Cardiomegaly: Secondary | ICD-10-CM | POA: Diagnosis not present

## 2018-10-07 DIAGNOSIS — E669 Obesity, unspecified: Secondary | ICD-10-CM

## 2018-10-07 DIAGNOSIS — N183 Chronic kidney disease, stage 3 unspecified: Secondary | ICD-10-CM

## 2018-10-07 DIAGNOSIS — Z7982 Long term (current) use of aspirin: Secondary | ICD-10-CM | POA: Diagnosis not present

## 2018-10-07 DIAGNOSIS — M549 Dorsalgia, unspecified: Secondary | ICD-10-CM

## 2018-10-07 DIAGNOSIS — G8929 Other chronic pain: Secondary | ICD-10-CM | POA: Diagnosis not present

## 2018-10-07 DIAGNOSIS — D3502 Benign neoplasm of left adrenal gland: Secondary | ICD-10-CM | POA: Diagnosis not present

## 2018-10-07 DIAGNOSIS — Z7689 Persons encountering health services in other specified circumstances: Secondary | ICD-10-CM | POA: Diagnosis not present

## 2018-10-07 DIAGNOSIS — I2510 Atherosclerotic heart disease of native coronary artery without angina pectoris: Secondary | ICD-10-CM | POA: Diagnosis not present

## 2018-10-07 DIAGNOSIS — C183 Malignant neoplasm of hepatic flexure: Secondary | ICD-10-CM

## 2018-10-07 DIAGNOSIS — M069 Rheumatoid arthritis, unspecified: Secondary | ICD-10-CM

## 2018-10-07 DIAGNOSIS — D500 Iron deficiency anemia secondary to blood loss (chronic): Secondary | ICD-10-CM | POA: Diagnosis not present

## 2018-10-07 DIAGNOSIS — K319 Disease of stomach and duodenum, unspecified: Secondary | ICD-10-CM | POA: Diagnosis not present

## 2018-10-07 DIAGNOSIS — I10 Essential (primary) hypertension: Secondary | ICD-10-CM | POA: Diagnosis not present

## 2018-10-07 DIAGNOSIS — C3412 Malignant neoplasm of upper lobe, left bronchus or lung: Secondary | ICD-10-CM

## 2018-10-07 DIAGNOSIS — Z510 Encounter for antineoplastic radiation therapy: Secondary | ICD-10-CM | POA: Diagnosis not present

## 2018-10-07 DIAGNOSIS — Z86010 Personal history of colonic polyps: Secondary | ICD-10-CM

## 2018-10-07 DIAGNOSIS — K449 Diaphragmatic hernia without obstruction or gangrene: Secondary | ICD-10-CM

## 2018-10-07 DIAGNOSIS — E1122 Type 2 diabetes mellitus with diabetic chronic kidney disease: Secondary | ICD-10-CM

## 2018-10-07 DIAGNOSIS — I509 Heart failure, unspecified: Secondary | ICD-10-CM | POA: Diagnosis not present

## 2018-10-07 DIAGNOSIS — Z66 Do not resuscitate: Secondary | ICD-10-CM

## 2018-10-07 DIAGNOSIS — D509 Iron deficiency anemia, unspecified: Secondary | ICD-10-CM | POA: Diagnosis not present

## 2018-10-07 DIAGNOSIS — N200 Calculus of kidney: Secondary | ICD-10-CM | POA: Diagnosis not present

## 2018-10-07 DIAGNOSIS — E039 Hypothyroidism, unspecified: Secondary | ICD-10-CM

## 2018-10-07 DIAGNOSIS — F329 Major depressive disorder, single episode, unspecified: Secondary | ICD-10-CM

## 2018-10-07 DIAGNOSIS — J449 Chronic obstructive pulmonary disease, unspecified: Secondary | ICD-10-CM

## 2018-10-07 DIAGNOSIS — C19 Malignant neoplasm of rectosigmoid junction: Secondary | ICD-10-CM | POA: Diagnosis not present

## 2018-10-07 DIAGNOSIS — I5032 Chronic diastolic (congestive) heart failure: Secondary | ICD-10-CM

## 2018-10-07 DIAGNOSIS — Z794 Long term (current) use of insulin: Secondary | ICD-10-CM

## 2018-10-07 DIAGNOSIS — E119 Type 2 diabetes mellitus without complications: Secondary | ICD-10-CM

## 2018-10-07 LAB — CMP (CANCER CENTER ONLY)
ALT: 8 U/L (ref 0–44)
AST: 14 U/L — ABNORMAL LOW (ref 15–41)
Albumin: 3.4 g/dL — ABNORMAL LOW (ref 3.5–5.0)
Alkaline Phosphatase: 147 U/L — ABNORMAL HIGH (ref 38–126)
Anion gap: 9 (ref 5–15)
BUN: 22 mg/dL (ref 8–23)
CO2: 29 mmol/L (ref 22–32)
Calcium: 8.7 mg/dL — ABNORMAL LOW (ref 8.9–10.3)
Chloride: 104 mmol/L (ref 98–111)
Creatinine: 1.88 mg/dL — ABNORMAL HIGH (ref 0.44–1.00)
GFR, Est AFR Am: 31 mL/min — ABNORMAL LOW (ref >60)
GFR, Estimated: 27 mL/min — ABNORMAL LOW (ref >60)
Glucose, Bld: 137 mg/dL — ABNORMAL HIGH (ref 70–99)
Potassium: 4.3 mmol/L (ref 3.5–5.1)
Sodium: 142 mmol/L (ref 135–145)
Total Bilirubin: 0.3 mg/dL (ref 0.3–1.2)
Total Protein: 6.7 g/dL (ref 6.5–8.1)

## 2018-10-07 LAB — CBC WITH DIFFERENTIAL (CANCER CENTER ONLY)
Abs Immature Granulocytes: 1.66 10*3/uL — ABNORMAL HIGH (ref 0.00–0.07)
Basophils Absolute: 0.2 10*3/uL — ABNORMAL HIGH (ref 0.0–0.1)
Basophils Relative: 1 %
Eosinophils Absolute: 0.1 10*3/uL (ref 0.0–0.5)
Eosinophils Relative: 0 %
HCT: 29.5 % — ABNORMAL LOW (ref 36.0–46.0)
Hemoglobin: 8.5 g/dL — ABNORMAL LOW (ref 12.0–15.0)
Immature Granulocytes: 10 %
Lymphocytes Relative: 12 %
Lymphs Abs: 2.1 10*3/uL (ref 0.7–4.0)
MCH: 27.1 pg (ref 26.0–34.0)
MCHC: 28.8 g/dL — ABNORMAL LOW (ref 30.0–36.0)
MCV: 93.9 fL (ref 80.0–100.0)
Monocytes Absolute: 0.8 10*3/uL (ref 0.1–1.0)
Monocytes Relative: 5 %
Neutro Abs: 12.5 10*3/uL — ABNORMAL HIGH (ref 1.7–7.7)
Neutrophils Relative %: 72 %
Platelet Count: 251 10*3/uL (ref 150–400)
RBC: 3.14 MIL/uL — ABNORMAL LOW (ref 3.87–5.11)
RDW: 23 % — ABNORMAL HIGH (ref 11.5–15.5)
WBC Count: 17.3 10*3/uL — ABNORMAL HIGH (ref 4.0–10.5)
nRBC: 0.2 % (ref 0.0–0.2)

## 2018-10-07 MED ORDER — PANTOPRAZOLE SODIUM 40 MG PO TBEC: 40.0000 mg | DELAYED_RELEASE_TABLET | Freq: Two times a day (BID) | ORAL | 3 refills | Status: AC

## 2018-10-07 NOTE — Progress Notes (Signed)
[place]   [phone] [phone]   Clinic Follow up Note   Patient Care Team: [name], MD as PCP - General (Internal Medicine) Herminio Commons, MD as PCP - Cardiology (Cardiology)  Date of Service:  10/11/2018  CHIEF COMPLAINT: F/u ofright colon cancerand Small cell lung cancer   SUMMARY OF ONCOLOGIC HISTORY: Oncology History   Cancer Staging Primary cancer of hepatic flexure of colon [facility]) Staging form: Colon and Rectum, AJCC 8th Edition - Clinical stage from 09/09/2018: Stage Unknown (cTX, cN0, cM0) - Signed by Truitt Merle, MD on 09/28/2018  Small cell lung cancer, left upper lobe ([place]) Staging form: Lung, AJCC 8th Edition - Clinical stage from 09/14/2018: Stage IIIB (cT3, cN2, cM0) - Signed by Truitt Merle, MD on 09/28/2018       Primary cancer of hepatic flexure of colon [facility])    Initial Diagnosis    Malignant neoplasm of hepatic flexure ([place])    09/09/2018 Procedure    Coloscopy by Dr Gala Romney 09/09/18  IMPRESSION -Colon mass as described in the vicinity of the hepatic flexure - status post biopsy and inking. Polyps associated with this tumor scattered all the way to the cecum were not removed; multiple splenic flexure and rectal polyps were removed as described above. Left-sided diverticulosis.  Upper endoscopy By Dr Gala Romney 09/09/18  IMPRESSION -Abnormal distal esophagus suspicious for Barrett's esophagus?biopsied. - Small hiatal hernia. - Erosive gastropathy. Biopsied. -Duodenal bulbar erosions    09/09/2018 Initial Biopsy    Diagnosis 09/09/18 1. Stomach, biopsy - GASTRIC ANTRAL MUCOSA WITH MILD NONSPECIFIC REACTIVE GASTROPATHY - GASTRIC OXYNTIC MUCOSA WITH PARIETAL CELL HYPERPLASIA AS CAN BE SEEN IN HYPERGASTRINEMIC STATES SUCH AS PROPER PATIENT IDENTIFICATION THERAPY. - WARTHIN STARRY STAIN IS NEGATIVE FOR HELICOBACTER PYLORI 2. Esophagus, biopsy - BARRETT'S ESOPHAGUS, NEGATIVE FOR DYSPLASIA 3. Colon, polyp(s), descending  - TUBULAR ADENOMA - NEGATIVE FOR HIGH-GRADE DYSPLASIA OR MALIGNANCY 4. Colon, biopsy, hepatic flexure - ADENOCARCINOMA. SEE NOTE 5. Colon, polyp(s), splenic flexure - ADENOCARCINOMA. SEE NOTE - OTHER FRAGMENTS OF TUBULAR ADENOMA(S) WITH ONE SHOWING HIGH-GRADE DYSPLASIA 6. Rectum, polyp(s) - TUBULAR ADENOMA(S) - NEGATIVE FOR HIGH-GRADE DYSPLASIA OR MALIGNANCY    09/09/2018 Cancer Staging    Staging form: Colon and Rectum, AJCC 8th Edition - Clinical stage from 09/09/2018: Stage Unknown (cTX, cN0, cM0) - Signed by Truitt Merle, MD on 09/28/2018    09/11/2018 Imaging    CT CAP 09/11/18  IMPRESSION: 1. Central perihilar left upper lobe 5.4 cm lung mass, new since 01/27/2017 chest CT, compatible with malignancy, favor primary bronchogenic carcinoma. Postobstructive pneumonia/atelectasis in the lingula. 2. Mediastinal adenopathy in the left prevascular/AP window and subcarinal stations, compatible with metastatic nodes (more likely originating from the left upper lobe lung mass). 3. Trace dependent left pleural effusion. 4. Apple-core lesion in the ascending colon, compatible with known primary colonic neoplasm. Adjacent top-normal size right mesenteric lymph node is equivocal for locoregional nodal metastasis. No additional potential findings of metastatic disease in the abdomen or pelvis. 5. Chronic findings include: Aortic Atherosclerosis (ICD10-I70.0). Borderline mild cardiomegaly. One vessel coronary atherosclerosis. Dilated main pulmonary artery, suggesting pulmonary arterial hypertension. Left adrenal adenoma. Punctate bilateral nephrolithiasis. Small hiatal hernia.    09/20/2018 Imaging    Brain MRI 09/20/18  IMPRESSION: 1. Motion degraded examination without brain metastases identified. 2. Small skull metastases not excluded. 3. Moderate to severe chronic small vessel ischemic disease.     Small cell lung cancer, left upper lobe ([place])   09/11/2018 Imaging    CT  CAP 09/11/18   IMPRESSION: 1. Central perihilar left upper lobe 5.4 cm lung mass, new since 01/27/2017 chest CT, compatible with malignancy, favor primary bronchogenic carcinoma. Postobstructive pneumonia/atelectasis in the lingula. 2. Mediastinal adenopathy in the left prevascular/AP window and subcarinal stations, compatible with metastatic nodes (more likely originating from the left upper lobe lung mass). 3. Trace dependent left pleural effusion. 4. Apple-core lesion in the ascending colon, compatible with known primary colonic neoplasm. Adjacent top-normal size right mesenteric lymph node is equivocal for locoregional nodal metastasis. No additional potential findings of metastatic disease in the abdomen or pelvis. 5. Chronic findings include: Aortic Atherosclerosis (ICD10-I70.0). Borderline mild cardiomegaly. One vessel coronary atherosclerosis. Dilated main pulmonary artery, suggesting pulmonary arterial hypertension. Left adrenal adenoma. Punctate bilateral nephrolithiasis. Small hiatal hernia.     09/14/2018 Initial Biopsy    Diagnosis 5/5.20 Lung, biopsy, LUL - SMALL CELL CARCINOMA.    09/14/2018 Cancer Staging    Staging form: Lung, AJCC 8th Edition - Clinical stage from 09/14/2018: Stage IIIB (cT3, cN2, cM0) - Signed by Truitt Merle, MD on 09/28/2018    09/15/2018 Initial Diagnosis    Small cell lung cancer, left upper lobe ([place])    09/18/2018 -  Chemotherapy    carboplatin and etoposide every 4 weeks for 4 cycles starting 09/18/18. Start concurrent chemoRt on 10/11/18.     09/20/2018 Imaging    Brain MRI 09/20/18  IMPRESSION: 1. Motion degraded examination without brain metastases identified. 2. Small skull metastases not excluded. 3. Moderate to severe chronic small vessel ischemic disease.      Radiation Therapy    Pending Concurrent chemoRT starting 10/11/18      CURRENT THERAPY:  Carboplatin and etoposideevery 3 weeks for 4 cycles starting 09/18/18.Concurrent chemoRt starting on  10/11/18.  INTERVAL HISTORY:  Stephanie Combs is here for a follow up. She presents to the clinic alone. She notes she has gained more weight and has been seeping liquid last night. She notes her legs are mildly red as well. She notes her legs and abdomen her very tight and cause pain. She notes she is taking 49m Lasix. She notes she [facility] [place] [facility] [place] [facility] [place]  She notes she would like to be admitted to hospital. She notes she had low grade fever of 99 range this past weekend. She denies diarrha and nausea. She notes her stomach feels like she has diarrhea but only had small BM yesterday. She feels tired. She feels she is filled of fluid.     REVIEW OF SYSTEMS:   Constitutional: Denies fevers, chills or abnormal weight loss (+) fatigued Eyes: Denies blurriness of vision Ears, nose, mouth, throat, and face: Denies mucositis or sore throat Respiratory: Denies cough, dyspnea or wheezes Cardiovascular: Denies palpitation, chest discomfort (+) severe b/l lower extremity swelling Gastrointestinal:  Denies nausea, heartburn or change in bowel habits (+) abdominal distention (+) abdominal discomfort  Skin: Denies abnormal skin rashes Lymphatics: Denies new lymphadenopathy or easy bruising Neurological:Denies numbness, tingling or new weaknesses Behavioral/Psych: Mood is stable, no new changes  All other systems were reviewed with the patient and are negative.  MEDICAL HISTORY:  Past Medical History:  Diagnosis Date  . Anxiety   . CAD (coronary artery disease)    a. s/p DES to LAD and angioplasty to D1 in 05/2017  . CHF (congestive heart failure) ([facility])    Diastolic  . Chronic back pain   . Chronic pain   . COPD (chronic obstructive  pulmonary disease) ([place])   . Degenerative disk disease   . Diabetes mellitus without complication ([place])   . History of medication noncompliance 11/2017   "the patient frequently self-adjusts  medications without notifying her physicians"  . Hypertension   . Hypothyroidism   . On home O2    2.5 L N/C prn  . Pedal edema     SURGICAL HISTORY: Past Surgical History:  Procedure Laterality Date  . BIOPSY  09/09/2018   Procedure: BIOPSY;  Surgeon: Daneil Dolin, MD;  [facility];  Service: Endoscopy;;  gastric esophageal hepatic flexure colon  . COLONOSCOPY WITH PROPOFOL N/A 09/09/2018   Procedure: COLONOSCOPY WITH PROPOFOL;  Surgeon: Daneil Dolin, MD;  [facility];  Service: Endoscopy;  Laterality: N/A;  . CORONARY STENT INTERVENTION N/A 05/22/2017   Procedure: CORONARY STENT INTERVENTION;  Surgeon: Martinique, Peter M, MD;  Location: [place] [facility];  Service: Cardiovascular;  Laterality: N/A;  . ESOPHAGOGASTRODUODENOSCOPY (EGD) WITH PROPOFOL N/A 09/09/2018   Procedure: ESOPHAGOGASTRODUODENOSCOPY (EGD) WITH PROPOFOL;  Surgeon: Daneil Dolin, MD;  [facility];  Service: Endoscopy;  Laterality: N/A;  . HERNIA REPAIR    . POLYPECTOMY  09/09/2018   Procedure: POLYPECTOMY;  Surgeon: Daneil Dolin, MD;  [facility];  Service: Endoscopy;;  colon  . RIGHT/LEFT HEART CATH AND CORONARY ANGIOGRAPHY N/A 05/19/2017   Procedure: RIGHT/LEFT HEART CATH AND CORONARY ANGIOGRAPHY;  Surgeon: Leonie Man, MD;  Location: [place] [facility];  Service: Cardiovascular;  Laterality: N/A;  . TUBAL LIGATION    . VIDEO BRONCHOSCOPY WITH ENDOBRONCHIAL ULTRASOUND Left 09/14/2018   Procedure: VIDEO BRONCHOSCOPY WITH ENDOBRONCHIAL ULTRASOUND with biopsies;  Surgeon: Margaretha Seeds, MD;  Location: [place];  Service: Thoracic;  Laterality: Left;    I have reviewed the social history and family history with the patient and they are unchanged from previous note.  ALLERGIES:  is allergic to dilaudid [hydromorphone hcl]; midodrine hcl; actifed cold-allergy [chlorpheniramine-phenylephrine]; doxycycline; other; penicillins; reglan [metoclopramide]; valium  [diazepam]; and vistaril [hydroxyzine hcl].  MEDICATIONS:  No current facility-administered medications for this visit.    No current outpatient medications on file.   Facility-Administered Medications Ordered in Other Visits  Medication Dose Route Frequency Provider Last Rate Last Dose  . acetaminophen (TYLENOL) tablet 650 mg  650 mg Oral Q6H PRN Mariel Aloe, MD       Or  . acetaminophen (TYLENOL) suppository 650 mg  650 mg Rectal Q6H PRN Mariel Aloe, MD      . albuterol (PROVENTIL) (2.5 MG/3ML) 0.083% nebulizer solution 2.5 mg  2.5 mg Nebulization Q6H PRN Mariel Aloe, MD      . ALPRAZolam Duanne Moron) tablet 1 mg  1 mg Oral TID PRN Mariel Aloe, MD   1 mg at 10/11/18 2052  . aspirin EC tablet 81 mg  81 mg Oral Daily Mariel Aloe, MD      . Derrill Memo ON 10/12/2018] atorvastatin (LIPITOR) tablet 80 mg  80 mg Oral QPM Mariel Aloe, MD      . Derrill Memo ON 10/12/2018] cholecalciferol (VITAMIN D3) tablet 5,000 Units  5,000 Units Oral Daily Mariel Aloe, MD      . Derrill Memo ON 10/12/2018] docusate sodium (COLACE) capsule 300 mg  300 mg Oral Daily Mariel Aloe, MD      . enoxaparin (LOVENOX) injection 60 mg  60 mg Subcutaneous Q24H Mariel Aloe, MD      . gabapentin (NEURONTIN) capsule  600 mg  600 mg Oral TID Mariel Aloe, MD   600 mg at 10/11/18 2053  . insulin aspart (novoLOG) injection 0-20 Units  0-20 Units Subcutaneous TID WC Mariel Aloe, MD      . insulin detemir (LEVEMIR) injection 32 Units  32 Units Subcutaneous QHS Mariel Aloe, MD      . Derrill Memo ON 10/12/2018] levothyroxine (SYNTHROID) tablet 200 mcg  200 mcg Oral Daily Mariel Aloe, MD      . oxyCODONE-acetaminophen (PERCOCET/ROXICET) 5-325 MG per tablet 1 tablet  1 tablet Oral Q6H PRN Mariel Aloe, MD   1 tablet at 10/11/18 1928   And  . oxyCODONE (Oxy IR/ROXICODONE) immediate release tablet 5 mg  5 mg Oral Q6H PRN Mariel Aloe, MD      . pantoprazole (PROTONIX) EC tablet 40 mg  40 mg Oral BID Mariel Aloe, MD   40 mg at 10/11/18 2055  . polyethylene glycol (MIRALAX / GLYCOLAX) packet 17 g  17 g Oral BID Mariel Aloe, MD      . potassium chloride SA (K-DUR) CR tablet 20 mEq  20 mEq Oral BID Mariel Aloe, MD      . senna (SENOKOT) tablet 17.2 mg  2 tablet Oral BID WC Mariel Aloe, MD   17.2 mg at 10/11/18 1656  . umeclidinium-vilanterol (ANORO ELLIPTA) 62.5-25 MCG/INH 1 puff  1 puff Inhalation Daily Mariel Aloe, MD        PHYSICAL EXAMINATION: ECOG PERFORMANCE STATUS: 3 - Symptomatic, >50% confined to bed  Vitals:   10/11/18 0814  BP: (!) 142/50  Pulse: (!) 107  Resp: 18  Temp: 98.7 F (37.1 C)  SpO2: 96%   Filed Weights   10/11/18 0814  Weight: (!) 337 lb 3.2 oz (153 kg)    GENERAL:alert, no distress and comfortable SKIN: skin color, texture, turgor are normal, no rashes or significant lesions EYES: normal, Conjunctiva are pink and non-injected, sclera clear  NECK: supple, thyroid normal size, non-tender, without nodularity LYMPH: no palpable lymphadenopathy in the cervical, axillary  LUNGS: clear percussion with normal breathing effort (+) Wheezing  HEART: regular rate & rhythm and no murmurs (+) severe b/l lower extremity edema, with mild skin erythema of lower legs likely cellulitis ABDOMEN:abdomen soft, non-tender and normal bowel sounds Musculoskeletal:no cyanosis of digits and no clubbing  NEURO: alert & oriented x 3 with fluent speech, no focal motor/sensory deficits  LABORATORY DATA:  I have reviewed the data as listed CBC Latest Ref Rng & Units 10/11/2018 10/07/2018 09/27/2018  WBC 4.0 - 10.5 K/uL 15.5(H) 17.3(H) 2.7(L)  Hemoglobin 12.0 - 15.0 g/dL 8.4(L) 8.5(L) 7.9(L)  Hematocrit 36.0 - 46.0 % 29.3(L) 29.5(L) 27.2(L)  Platelets 150 - 400 K/uL 358 251 159     CMP Latest Ref Rng & Units 10/11/2018 10/07/2018 09/27/2018  Glucose 70 - 99 mg/dL 89 137(H) 184(H)  BUN 8 - 23 mg/dL 20 22 29(H)  Creatinine 0.44 - 1.00 mg/dL 1.47(H) 1.88(H) 1.35(H)  Sodium 135 -  145 mmol/L 143 142 141  Potassium 3.5 - 5.1 mmol/L 4.1 4.3 4.5  Chloride 98 - 111 mmol/L 106 104 106  CO2 22 - 32 mmol/L _0 Calcium 8.9 - 10.3 mg/dL 8.6(L) 8.7(L) 8.3(L)  Total Protein 6.5 - 8.1 g/dL 6.4(L) 6.7 6.0(L)  Total Bilirubin 0.3 - 1.2 mg/dL 0.3 0.3 0.3  Alkaline Phos 38 - 126 U/L 131(H) 147(H) 96  AST 15 - 41 U/L 11(L)  14(L) 13(L)  ALT 0 - 44 U/L _0 RADIOGRAPHIC STUDIES: I have personally reviewed the radiological images as listed and agreed with the findings in the report. Dg Chest Port 1 View  Result Date: 10/11/2018 CLINICAL DATA:  Viral pneumonia EXAM: PORTABLE CHEST 1 VIEW COMPARISON:  09/22/2018 FINDINGS: The cardiac size remains enlarged. There is elevation of the right hemidiaphragm. No focal consolidation. No pneumothorax. There is mild generalized volume overload. The patient is rotated which limits evaluation. There is no acute osseous abnormality. There is atelectasis at the right lung base. IMPRESSION: 1. No radiographic evidence for multifocal pneumonia or a significant area of consolidation. 2. Cardiomegaly with mild vascular congestion. Right basilar airspace opacity is presumed to represent atelectasis or chronic scarring. Electronically Signed   By: Constance Holster M.D.   On: 10/11/2018 20:10     ASSESSMENT & PLAN:  AHRIYAH VANNEST is a 68 y.o. female with   1. Small Cell Lung cancer, LUL, cT3N2M0, stage III, limited stage  -Newly diagnosedin 09/2018 during her work up for colon cancer -Ipreviouslydiscussed her lung cancer would take priority in terms of treatment given theaggressivenessof small cell lung cancer, and very high risk of recurrence evenwith curative  intentconcurrentchemoradiation.  -The goal of therapy is curative. But unfortunately she hassynchronized colon cancer, which is incurable because she is not a candidate for surgery due to her medical comorbilities  -She started first cycle chemo as inpatient on  09/18/18.Chemo dose reduced for cycle 1.Shetolerated first cycle chemomoderately well with fatigue,intermittent nausea, no vomiting. -Plan to start concurrent chemoRT with cycle 2 chemo today 10/11/18. -Her LE swelling continues to worsen even with Lasix 39m daily and she has gained over 10lbs in the past 3 days. She previously responded much better to IV lasix for fluid overload. She had low grade fever this past weekend in 99 range, possible cellulitis. [facility] and [facility]  -She expressed her strong wish that she would like to still proceed with chemo and radiation when she can.  -Labs reviewed, CBC and CMP WNL except WBC 15.5, Hg 8.4, ANC 13.2, Cr 1.47, Ca 8.6, protein 6.4, albumin 3.3, AST 11, Alk Phos 131.  -She will proceed with cycle 2 chemo and the start of Radiation today. I will slightly increase her dose carbo to 4542msince she tolerated first cycle 40044mell overall.  -will give day 2 and 3 etoposide [facility]  -No GCSF when she is on radiation    2. 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  -Unfortunately she is not a candidate for colon cancer surgery due to very high risk ofsurgical mortality.She was seen by surgery during her recent hospitalization.  -I discussed the incurablenature of her colon cancer without surgery. -Willcontinueto monitor on current treatment for her SCLC. -if [facility] low intensity chemo such as Xeloda or Irinotecan (which works for [employer]). Given normal MMR, she is not a candidate for immunotherapy. Given primary right side colon cancer, not much benefit from EGFR inhibitors even if KRAS/NRAS/BRAF wild type.  -I have ordered FO on her biopsy sample to see if she has any other targeted therapy options  3.  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  -CBC  shows Hg at8.4today (10/11/18), continue monitoring   4. CAD, CHF, HTN, significant LE edema and weight gain  -OnLipitor, lasix, nitro -Continue to f/u with cardiologist. -due to her worsening Cr, and pt feels "too much lasix" last week and I decreased her lasix from 92m bid to 610mdaily on 10/07/2018 -She previously responded well to iv lasix, may consider iv lasix during chemo  -She has been taking 6063masix, but has gained another 10 pounds with possible cellulitis and does not feel this is enough to reduce her fluid retention. She prefers hospitalization with IV Lasix. [facility])   5.DM,hypothyroidism, morbid obesity -On insulin novolog and long acting levemir. OnLevothyroxine.  -will monitor glucose closely during her chemo treatment  6.Rheumatoidarthritis, chronic back pain  -On Percocet which she takes 4 times a day.  .-She is also hasGabapentin.  -She use to receiveinjectionfor her back -Pain chronic, stable. She will continue to see her RA and pain specialist.   7. Depression and anxiety -She has been more depressed with recent diagnosis of 2 cancers -She is currently on Celebrex, will continue.  -She notes she still has faith and understands her prognosis. I encouraged her to continue her faith.   8.Goal of care discussion, DNR/DNI -We again discussed the incurable nature of her cancer, and the overall poor prognosis, especially ifshe does not have good response to chemotherapy or progress on chemo -The patient understands the goal of care is palliative. -I recommend DNR/DNI,she has agreed to DRI/DNR.She has written will and has established her daughter as her POA.   9. Social Support  -She has help from her daughter, friend and neighbor  -She has home aid service  at home but given her poor performance status and diagnosis she is requesting more hours for support. I will contact them to discuss her condition.   Plan:  -Labs reviewed, will proceed with cycle 2 day 1 Carbo/etoposide today, carbo dose slightly increase from cycle 1 --she will likely proceed with first dose RT today  -[facility] and possible cellulitis after chemoRT -plan to give day 2 and 3 chemo Etoposide in the hospital -I spoke with hospitalist Dr. NetLonny PrudeOVID-19 test is required for hospital admission, [facility], we are not able to do the test in our office, Dr. NetLonny Prudell do it as soon as she gets to [facility] [place]-I will f/u when she is in the hospital.      No problem-specific Assessment & Plan notes found for this encounter.   No orders of the defined types were placed in this encounter.  All questions were answered. The patient knows to call the clinic with any problems, questions or concerns. No barriers to learning was detected. I spent 30 minutes counseling the patient face to face. The total time spent in the appointment was 40 minutes and more than 50% was on counseling and review of test results     Stephanie Combs 10/11/2018   I, Stephanie Combs acting as scribe for Stephanie Combs.   I have reviewed the above documentation for accuracy and completeness, and I agree with the above.

## 2018-10-07 NOTE — Telephone Encounter (Signed)
No los per 5/28.

## 2018-10-08 DIAGNOSIS — M25511 Pain in right shoulder: Secondary | ICD-10-CM | POA: Diagnosis not present

## 2018-10-08 DIAGNOSIS — E119 Type 2 diabetes mellitus without complications: Secondary | ICD-10-CM | POA: Diagnosis not present

## 2018-10-08 DIAGNOSIS — I509 Heart failure, unspecified: Secondary | ICD-10-CM | POA: Diagnosis not present

## 2018-10-08 DIAGNOSIS — C3490 Malignant neoplasm of unspecified part of unspecified bronchus or lung: Secondary | ICD-10-CM | POA: Diagnosis not present

## 2018-10-08 DIAGNOSIS — C3412 Malignant neoplasm of upper lobe, left bronchus or lung: Secondary | ICD-10-CM | POA: Diagnosis not present

## 2018-10-08 DIAGNOSIS — E039 Hypothyroidism, unspecified: Secondary | ICD-10-CM | POA: Diagnosis not present

## 2018-10-08 DIAGNOSIS — I110 Hypertensive heart disease with heart failure: Secondary | ICD-10-CM | POA: Diagnosis not present

## 2018-10-08 DIAGNOSIS — M25562 Pain in left knee: Secondary | ICD-10-CM | POA: Diagnosis not present

## 2018-10-08 DIAGNOSIS — D509 Iron deficiency anemia, unspecified: Secondary | ICD-10-CM | POA: Diagnosis not present

## 2018-10-08 DIAGNOSIS — I951 Orthostatic hypotension: Secondary | ICD-10-CM | POA: Diagnosis not present

## 2018-10-08 DIAGNOSIS — C19 Malignant neoplasm of rectosigmoid junction: Secondary | ICD-10-CM | POA: Diagnosis not present

## 2018-10-08 DIAGNOSIS — Z510 Encounter for antineoplastic radiation therapy: Secondary | ICD-10-CM | POA: Diagnosis not present

## 2018-10-08 DIAGNOSIS — I2510 Atherosclerotic heart disease of native coronary artery without angina pectoris: Secondary | ICD-10-CM | POA: Diagnosis not present

## 2018-10-11 ENCOUNTER — Inpatient Hospital Stay (HOSPITAL_BASED_OUTPATIENT_CLINIC_OR_DEPARTMENT_OTHER): Payer: Medicare Other | Admitting: Hematology

## 2018-10-11 ENCOUNTER — Observation Stay (HOSPITAL_COMMUNITY): Payer: Medicare Other

## 2018-10-11 ENCOUNTER — Inpatient Hospital Stay: Payer: Medicare Other

## 2018-10-11 ENCOUNTER — Encounter: Payer: Self-pay | Admitting: Hematology

## 2018-10-11 ENCOUNTER — Ambulatory Visit
Admission: RE | Admit: 2018-10-11 | Discharge: 2018-10-11 | Disposition: A | Discharge status: Home / Self Care | Payer: Medicare Other | Source: Ambulatory Visit | Attending: Radiation Oncology | Admitting: Radiation Oncology

## 2018-10-11 ENCOUNTER — Inpatient Hospital Stay (HOSPITAL_COMMUNITY)
Admission: AD | Admit: 2018-10-11 | Discharge: 2018-10-30 | DRG: 291 | Disposition: A | Discharge status: Home Health | Payer: Medicare Other | Source: Ambulatory Visit | Attending: Family Medicine | Admitting: Family Medicine

## 2018-10-11 ENCOUNTER — Inpatient Hospital Stay: Payer: Medicare Other | Attending: Hematology

## 2018-10-11 ENCOUNTER — Other Ambulatory Visit: Payer: Self-pay

## 2018-10-11 ENCOUNTER — Telehealth: Payer: Self-pay | Admitting: Hematology

## 2018-10-11 VITALS — HR 86

## 2018-10-11 VITALS — BP 142/50 | HR 107 | Temp 98.7°F | Resp 18 | Wt 337.2 lb

## 2018-10-11 DIAGNOSIS — I10 Essential (primary) hypertension: Secondary | ICD-10-CM

## 2018-10-11 DIAGNOSIS — M069 Rheumatoid arthritis, unspecified: Secondary | ICD-10-CM | POA: Diagnosis not present

## 2018-10-11 DIAGNOSIS — E119 Type 2 diabetes mellitus without complications: Secondary | ICD-10-CM | POA: Insufficient documentation

## 2018-10-11 DIAGNOSIS — J811 Chronic pulmonary edema: Secondary | ICD-10-CM | POA: Diagnosis not present

## 2018-10-11 DIAGNOSIS — U071 COVID-19: Secondary | ICD-10-CM | POA: Diagnosis not present

## 2018-10-11 DIAGNOSIS — E1122 Type 2 diabetes mellitus with diabetic chronic kidney disease: Secondary | ICD-10-CM

## 2018-10-11 DIAGNOSIS — G8929 Other chronic pain: Secondary | ICD-10-CM

## 2018-10-11 DIAGNOSIS — Z9221 Personal history of antineoplastic chemotherapy: Secondary | ICD-10-CM

## 2018-10-11 DIAGNOSIS — R635 Abnormal weight gain: Secondary | ICD-10-CM

## 2018-10-11 DIAGNOSIS — E039 Hypothyroidism, unspecified: Secondary | ICD-10-CM

## 2018-10-11 DIAGNOSIS — D500 Iron deficiency anemia secondary to blood loss (chronic): Secondary | ICD-10-CM | POA: Insufficient documentation

## 2018-10-11 DIAGNOSIS — D61810 Antineoplastic chemotherapy induced pancytopenia: Secondary | ICD-10-CM | POA: Diagnosis not present

## 2018-10-11 DIAGNOSIS — E1142 Type 2 diabetes mellitus with diabetic polyneuropathy: Secondary | ICD-10-CM | POA: Diagnosis present

## 2018-10-11 DIAGNOSIS — N183 Chronic kidney disease, stage 3 unspecified: Secondary | ICD-10-CM

## 2018-10-11 DIAGNOSIS — D631 Anemia in chronic kidney disease: Secondary | ICD-10-CM | POA: Diagnosis not present

## 2018-10-11 DIAGNOSIS — F329 Major depressive disorder, single episode, unspecified: Secondary | ICD-10-CM | POA: Insufficient documentation

## 2018-10-11 DIAGNOSIS — I2510 Atherosclerotic heart disease of native coronary artery without angina pectoris: Secondary | ICD-10-CM | POA: Diagnosis present

## 2018-10-11 DIAGNOSIS — C182 Malignant neoplasm of ascending colon: Secondary | ICD-10-CM | POA: Diagnosis present

## 2018-10-11 DIAGNOSIS — Z66 Do not resuscitate: Secondary | ICD-10-CM | POA: Diagnosis present

## 2018-10-11 DIAGNOSIS — E785 Hyperlipidemia, unspecified: Secondary | ICD-10-CM | POA: Diagnosis present

## 2018-10-11 DIAGNOSIS — Z833 Family history of diabetes mellitus: Secondary | ICD-10-CM

## 2018-10-11 DIAGNOSIS — Z5111 Encounter for antineoplastic chemotherapy: Secondary | ICD-10-CM | POA: Diagnosis not present

## 2018-10-11 DIAGNOSIS — Z794 Long term (current) use of insulin: Secondary | ICD-10-CM | POA: Insufficient documentation

## 2018-10-11 DIAGNOSIS — J9611 Chronic respiratory failure with hypoxia: Secondary | ICD-10-CM | POA: Diagnosis not present

## 2018-10-11 DIAGNOSIS — I130 Hypertensive heart and chronic kidney disease with heart failure and stage 1 through stage 4 chronic kidney disease, or unspecified chronic kidney disease: Secondary | ICD-10-CM | POA: Diagnosis not present

## 2018-10-11 DIAGNOSIS — C3490 Malignant neoplasm of unspecified part of unspecified bronchus or lung: Secondary | ICD-10-CM | POA: Diagnosis not present

## 2018-10-11 DIAGNOSIS — C3412 Malignant neoplasm of upper lobe, left bronchus or lung: Secondary | ICD-10-CM

## 2018-10-11 DIAGNOSIS — Z8249 Family history of ischemic heart disease and other diseases of the circulatory system: Secondary | ICD-10-CM

## 2018-10-11 DIAGNOSIS — I509 Heart failure, unspecified: Secondary | ICD-10-CM | POA: Insufficient documentation

## 2018-10-11 DIAGNOSIS — Z7982 Long term (current) use of aspirin: Secondary | ICD-10-CM

## 2018-10-11 DIAGNOSIS — J440 Chronic obstructive pulmonary disease with acute lower respiratory infection: Secondary | ICD-10-CM | POA: Diagnosis present

## 2018-10-11 DIAGNOSIS — R5383 Other fatigue: Secondary | ICD-10-CM

## 2018-10-11 DIAGNOSIS — L03116 Cellulitis of left lower limb: Secondary | ICD-10-CM | POA: Diagnosis not present

## 2018-10-11 DIAGNOSIS — Z881 Allergy status to other antibiotic agents status: Secondary | ICD-10-CM

## 2018-10-11 DIAGNOSIS — L03115 Cellulitis of right lower limb: Secondary | ICD-10-CM | POA: Insufficient documentation

## 2018-10-11 DIAGNOSIS — G894 Chronic pain syndrome: Secondary | ICD-10-CM | POA: Diagnosis not present

## 2018-10-11 DIAGNOSIS — A0839 Other viral enteritis: Secondary | ICD-10-CM | POA: Diagnosis not present

## 2018-10-11 DIAGNOSIS — I5022 Chronic systolic (congestive) heart failure: Secondary | ICD-10-CM

## 2018-10-11 DIAGNOSIS — L0231 Cutaneous abscess of buttock: Secondary | ICD-10-CM | POA: Diagnosis not present

## 2018-10-11 DIAGNOSIS — M549 Dorsalgia, unspecified: Secondary | ICD-10-CM

## 2018-10-11 DIAGNOSIS — I5033 Acute on chronic diastolic (congestive) heart failure: Secondary | ICD-10-CM | POA: Diagnosis present

## 2018-10-11 DIAGNOSIS — M79604 Pain in right leg: Secondary | ICD-10-CM | POA: Diagnosis not present

## 2018-10-11 DIAGNOSIS — Z823 Family history of stroke: Secondary | ICD-10-CM

## 2018-10-11 DIAGNOSIS — T451X5A Adverse effect of antineoplastic and immunosuppressive drugs, initial encounter: Secondary | ICD-10-CM | POA: Diagnosis not present

## 2018-10-11 DIAGNOSIS — Z87891 Personal history of nicotine dependence: Secondary | ICD-10-CM

## 2018-10-11 DIAGNOSIS — J439 Emphysema, unspecified: Secondary | ICD-10-CM | POA: Diagnosis not present

## 2018-10-11 DIAGNOSIS — J1289 Other viral pneumonia: Secondary | ICD-10-CM | POA: Diagnosis not present

## 2018-10-11 DIAGNOSIS — F419 Anxiety disorder, unspecified: Secondary | ICD-10-CM | POA: Diagnosis not present

## 2018-10-11 DIAGNOSIS — Z79899 Other long term (current) drug therapy: Secondary | ICD-10-CM

## 2018-10-11 DIAGNOSIS — Z79890 Hormone replacement therapy (postmenopausal): Secondary | ICD-10-CM

## 2018-10-11 DIAGNOSIS — Z955 Presence of coronary angioplasty implant and graft: Secondary | ICD-10-CM

## 2018-10-11 DIAGNOSIS — Z79891 Long term (current) use of opiate analgesic: Secondary | ICD-10-CM

## 2018-10-11 DIAGNOSIS — Z885 Allergy status to narcotic agent status: Secondary | ICD-10-CM

## 2018-10-11 DIAGNOSIS — Z9981 Dependence on supplemental oxygen: Secondary | ICD-10-CM

## 2018-10-11 DIAGNOSIS — C183 Malignant neoplasm of hepatic flexure: Secondary | ICD-10-CM | POA: Insufficient documentation

## 2018-10-11 DIAGNOSIS — Z888 Allergy status to other drugs, medicaments and biological substances status: Secondary | ICD-10-CM

## 2018-10-11 DIAGNOSIS — Z880 Allergy status to penicillin: Secondary | ICD-10-CM

## 2018-10-11 DIAGNOSIS — R630 Anorexia: Secondary | ICD-10-CM | POA: Diagnosis not present

## 2018-10-11 DIAGNOSIS — Z6841 Body Mass Index (BMI) 40.0 and over, adult: Secondary | ICD-10-CM

## 2018-10-11 DIAGNOSIS — R0600 Dyspnea, unspecified: Secondary | ICD-10-CM

## 2018-10-11 DIAGNOSIS — N182 Chronic kidney disease, stage 2 (mild): Secondary | ICD-10-CM | POA: Diagnosis not present

## 2018-10-11 DIAGNOSIS — J449 Chronic obstructive pulmonary disease, unspecified: Secondary | ICD-10-CM | POA: Diagnosis present

## 2018-10-11 DIAGNOSIS — I5032 Chronic diastolic (congestive) heart failure: Secondary | ICD-10-CM

## 2018-10-11 DIAGNOSIS — E669 Obesity, unspecified: Secondary | ICD-10-CM | POA: Insufficient documentation

## 2018-10-11 DIAGNOSIS — N189 Chronic kidney disease, unspecified: Secondary | ICD-10-CM | POA: Diagnosis present

## 2018-10-11 LAB — CMP (CANCER CENTER ONLY)
ALT: 8 U/L (ref 0–44)
AST: 11 U/L — ABNORMAL LOW (ref 15–41)
Albumin: 3.3 g/dL — ABNORMAL LOW (ref 3.5–5.0)
Alkaline Phosphatase: 131 U/L — ABNORMAL HIGH (ref 38–126)
Anion gap: 10 (ref 5–15)
BUN: 20 mg/dL (ref 8–23)
CO2: 27 mmol/L (ref 22–32)
Calcium: 8.6 mg/dL — ABNORMAL LOW (ref 8.9–10.3)
Chloride: 106 mmol/L (ref 98–111)
Creatinine: 1.47 mg/dL — ABNORMAL HIGH (ref 0.44–1.00)
GFR, Est AFR Am: 42 mL/min — ABNORMAL LOW (ref >60)
GFR, Estimated: 36 mL/min — ABNORMAL LOW (ref >60)
Glucose, Bld: 89 mg/dL (ref 70–99)
Potassium: 4.1 mmol/L (ref 3.5–5.1)
Sodium: 143 mmol/L (ref 135–145)
Total Bilirubin: 0.3 mg/dL (ref 0.3–1.2)
Total Protein: 6.4 g/dL — ABNORMAL LOW (ref 6.5–8.1)

## 2018-10-11 LAB — CBC WITH DIFFERENTIAL (CANCER CENTER ONLY)
Abs Immature Granulocytes: 0.27 10*3/uL — ABNORMAL HIGH (ref 0.00–0.07)
Basophils Absolute: 0.1 10*3/uL (ref 0.0–0.1)
Basophils Relative: 1 %
Eosinophils Absolute: 0.1 10*3/uL (ref 0.0–0.5)
Eosinophils Relative: 0 %
HCT: 29.3 % — ABNORMAL LOW (ref 36.0–46.0)
Hemoglobin: 8.4 g/dL — ABNORMAL LOW (ref 12.0–15.0)
Immature Granulocytes: 2 %
Lymphocytes Relative: 7 %
Lymphs Abs: 1.1 10*3/uL (ref 0.7–4.0)
MCH: 27.5 pg (ref 26.0–34.0)
MCHC: 28.7 g/dL — ABNORMAL LOW (ref 30.0–36.0)
MCV: 96.1 fL (ref 80.0–100.0)
Monocytes Absolute: 0.7 10*3/uL (ref 0.1–1.0)
Monocytes Relative: 5 %
Neutro Abs: 13.2 10*3/uL — ABNORMAL HIGH (ref 1.7–7.7)
Neutrophils Relative %: 85 %
Platelet Count: 358 10*3/uL (ref 150–400)
RBC: 3.05 MIL/uL — ABNORMAL LOW (ref 3.87–5.11)
RDW: 23.6 % — ABNORMAL HIGH (ref 11.5–15.5)
WBC Count: 15.5 10*3/uL — ABNORMAL HIGH (ref 4.0–10.5)
nRBC: 0 % (ref 0.0–0.2)

## 2018-10-11 LAB — SARS CORONAVIRUS 2 BY RT PCR (HOSPITAL ORDER, PERFORMED IN ~~LOC~~ HOSPITAL LAB): SARS Coronavirus 2: POSITIVE — AB

## 2018-10-11 LAB — GLUCOSE, CAPILLARY
Glucose-Capillary: 206 mg/dL — ABNORMAL HIGH (ref 70–99)
Glucose-Capillary: 367 mg/dL — ABNORMAL HIGH (ref 70–99)

## 2018-10-11 MED ORDER — DEXAMETHASONE SODIUM PHOSPHATE 10 MG/ML IJ SOLN
INTRAMUSCULAR | Status: AC
  Filled 2018-10-11: qty 1

## 2018-10-11 MED ORDER — DEXAMETHASONE SODIUM PHOSPHATE 10 MG/ML IJ SOLN
10.0000 mg | Freq: Once | INTRAMUSCULAR | Status: AC
  Administered 2018-10-11: 10 mg via INTRAVENOUS

## 2018-10-11 MED ORDER — ENOXAPARIN SODIUM 60 MG/0.6ML ~~LOC~~ SOLN
60.0000 mg | SUBCUTANEOUS | Status: DC
  Administered 2018-10-11: 60 mg via SUBCUTANEOUS
  Filled 2018-10-11: qty 0.6

## 2018-10-11 MED ORDER — OXYCODONE-ACETAMINOPHEN 10-325 MG PO TABS: 1.0000 | ORAL_TABLET | Freq: Four times a day (QID) | ORAL | Status: DC | PRN

## 2018-10-11 MED ORDER — SENNA 8.6 MG PO TABS
2.0000 | ORAL_TABLET | Freq: Two times a day (BID) | ORAL | Status: DC
  Administered 2018-10-11 – 2018-10-30 (×36): 17.2 mg via ORAL
  Filled 2018-10-11 (×36): qty 2

## 2018-10-11 MED ORDER — VITAMIN D 25 MCG (1000 UNIT) PO TABS
5000.0000 [IU] | ORAL_TABLET | Freq: Every day | ORAL | Status: DC
  Administered 2018-10-12 – 2018-10-30 (×19): 5000 [IU] via ORAL
  Filled 2018-10-11 (×19): qty 5

## 2018-10-11 MED ORDER — SODIUM CHLORIDE 0.9 % IV SOLN
Freq: Once | INTRAVENOUS | Status: AC
  Administered 2018-10-11: 10:00:00 via INTRAVENOUS
  Filled 2018-10-11: qty 250

## 2018-10-11 MED ORDER — PALONOSETRON HCL INJECTION 0.25 MG/5ML
INTRAVENOUS | Status: AC
  Filled 2018-10-11: qty 5

## 2018-10-11 MED ORDER — UMECLIDINIUM-VILANTEROL 62.5-25 MCG/INH IN AEPB
1.0000 | INHALATION_SPRAY | Freq: Every day | RESPIRATORY_TRACT | Status: DC
  Administered 2018-10-12 – 2018-10-30 (×15): 1 via RESPIRATORY_TRACT
  Filled 2018-10-11 (×3): qty 14

## 2018-10-11 MED ORDER — DOCUSATE SODIUM 100 MG PO CAPS
100.0000 mg | ORAL_CAPSULE | Freq: Once | ORAL | Status: AC
  Administered 2018-10-12: 100 mg via ORAL
  Filled 2018-10-11: qty 1

## 2018-10-11 MED ORDER — OXYCODONE-ACETAMINOPHEN 5-325 MG PO TABS
1.0000 | ORAL_TABLET | Freq: Four times a day (QID) | ORAL | Status: DC | PRN
  Administered 2018-10-11 – 2018-10-30 (×40): 1 via ORAL
  Filled 2018-10-11 (×43): qty 1

## 2018-10-11 MED ORDER — ATORVASTATIN CALCIUM 40 MG PO TABS
80.0000 mg | ORAL_TABLET | Freq: Every evening | ORAL | Status: DC
  Administered 2018-10-12 – 2018-10-15 (×3): 80 mg via ORAL
  Filled 2018-10-11 (×4): qty 2

## 2018-10-11 MED ORDER — INSULIN ASPART 100 UNIT/ML ~~LOC~~ SOLN
0.0000 [IU] | Freq: Three times a day (TID) | SUBCUTANEOUS | Status: DC
  Administered 2018-10-12: 11 [IU] via SUBCUTANEOUS
  Administered 2018-10-12: 18:00:00 7 [IU] via SUBCUTANEOUS
  Administered 2018-10-12: 15 [IU] via SUBCUTANEOUS
  Administered 2018-10-13: 7 [IU] via SUBCUTANEOUS
  Administered 2018-10-13: 11 [IU] via SUBCUTANEOUS

## 2018-10-11 MED ORDER — POTASSIUM CHLORIDE CRYS ER 20 MEQ PO TBCR
20.0000 meq | EXTENDED_RELEASE_TABLET | Freq: Two times a day (BID) | ORAL | Status: DC
  Administered 2018-10-12 – 2018-10-13 (×3): 20 meq via ORAL
  Filled 2018-10-11 (×4): qty 1

## 2018-10-11 MED ORDER — DOCUSATE SODIUM 100 MG PO CAPS
300.0000 mg | ORAL_CAPSULE | Freq: Every day | ORAL | Status: DC
  Administered 2018-10-12 – 2018-10-30 (×18): 300 mg via ORAL
  Filled 2018-10-11 (×19): qty 3

## 2018-10-11 MED ORDER — GABAPENTIN 600 MG PO TABS
600.0000 mg | ORAL_TABLET | Freq: Three times a day (TID) | ORAL | Status: DC
  Filled 2018-10-11: qty 1

## 2018-10-11 MED ORDER — GERHARDT'S BUTT CREAM
TOPICAL_CREAM | Freq: Three times a day (TID) | CUTANEOUS | Status: DC
  Administered 2018-10-12 – 2018-10-25 (×40): via TOPICAL
  Administered 2018-10-25: 1 via TOPICAL
  Administered 2018-10-25 – 2018-10-26 (×3): via TOPICAL
  Administered 2018-10-26: 1 via TOPICAL
  Administered 2018-10-27 – 2018-10-28 (×6): via TOPICAL
  Administered 2018-10-29: 1 via TOPICAL
  Administered 2018-10-29: 16:00:00 via TOPICAL
  Administered 2018-10-30: 1 via TOPICAL
  Filled 2018-10-11 (×3): qty 1

## 2018-10-11 MED ORDER — POTASSIUM CHLORIDE CRYS ER 20 MEQ PO TBCR
20.0000 meq | EXTENDED_RELEASE_TABLET | Freq: Once | ORAL | Status: AC
  Administered 2018-10-11: 20 meq via ORAL
  Filled 2018-10-11: qty 1

## 2018-10-11 MED ORDER — GABAPENTIN 300 MG PO CAPS
600.0000 mg | ORAL_CAPSULE | Freq: Three times a day (TID) | ORAL | Status: DC
  Administered 2018-10-11 – 2018-10-30 (×57): 600 mg via ORAL
  Filled 2018-10-11 (×37): qty 2
  Filled 2018-10-11: qty 6
  Filled 2018-10-11 (×19): qty 2

## 2018-10-11 MED ORDER — INSULIN DETEMIR 100 UNIT/ML ~~LOC~~ SOLN
32.0000 [IU] | Freq: Every day | SUBCUTANEOUS | Status: DC
  Administered 2018-10-12: 32 [IU] via SUBCUTANEOUS
  Filled 2018-10-11 (×2): qty 0.32

## 2018-10-11 MED ORDER — ALBUTEROL SULFATE (2.5 MG/3ML) 0.083% IN NEBU: 2.5000 mg | INHALATION_SOLUTION | Freq: Four times a day (QID) | RESPIRATORY_TRACT | Status: DC | PRN

## 2018-10-11 MED ORDER — SODIUM CHLORIDE 0.9 % IV SOLN
100.0000 mg/m2 | Freq: Once | INTRAVENOUS | Status: AC
  Administered 2018-10-11: 12:00:00 260 mg via INTRAVENOUS
  Filled 2018-10-11: qty 13

## 2018-10-11 MED ORDER — SODIUM CHLORIDE 0.9 % IV SOLN: 10.0000 mg | Freq: Once | INTRAVENOUS | Status: DC

## 2018-10-11 MED ORDER — PALONOSETRON HCL INJECTION 0.25 MG/5ML
0.2500 mg | Freq: Once | INTRAVENOUS | Status: AC
  Administered 2018-10-11: 10:00:00 0.25 mg via INTRAVENOUS

## 2018-10-11 MED ORDER — ACETAMINOPHEN 650 MG RE SUPP: 650.0000 mg | Freq: Four times a day (QID) | RECTAL | Status: DC | PRN

## 2018-10-11 MED ORDER — ACETAMINOPHEN 325 MG PO TABS
650.0000 mg | ORAL_TABLET | Freq: Four times a day (QID) | ORAL | Status: DC | PRN
  Administered 2018-10-13: 650 mg via ORAL
  Filled 2018-10-11: qty 2

## 2018-10-11 MED ORDER — ALPRAZOLAM 0.5 MG PO TABS
1.0000 mg | ORAL_TABLET | Freq: Three times a day (TID) | ORAL | Status: DC | PRN
  Administered 2018-10-11 – 2018-10-30 (×39): 1 mg via ORAL
  Filled 2018-10-11 (×9): qty 2
  Filled 2018-10-11: qty 1
  Filled 2018-10-11 (×5): qty 2
  Filled 2018-10-11: qty 1
  Filled 2018-10-11 (×23): qty 2

## 2018-10-11 MED ORDER — PANTOPRAZOLE SODIUM 40 MG PO TBEC
40.0000 mg | DELAYED_RELEASE_TABLET | Freq: Two times a day (BID) | ORAL | Status: DC
  Administered 2018-10-11 – 2018-10-30 (×38): 40 mg via ORAL
  Filled 2018-10-11 (×38): qty 1

## 2018-10-11 MED ORDER — ASPIRIN EC 81 MG PO TBEC
81.0000 mg | DELAYED_RELEASE_TABLET | Freq: Every day | ORAL | Status: DC
  Administered 2018-10-12 – 2018-10-30 (×19): 81 mg via ORAL
  Filled 2018-10-11 (×19): qty 1

## 2018-10-11 MED ORDER — POLYETHYLENE GLYCOL 3350 17 G PO PACK
17.0000 g | PACK | Freq: Two times a day (BID) | ORAL | Status: DC
  Administered 2018-10-11 – 2018-10-29 (×34): 17 g via ORAL
  Filled 2018-10-11 (×36): qty 1

## 2018-10-11 MED ORDER — NYSTATIN 100000 UNIT/GM EX POWD
Freq: Three times a day (TID) | CUTANEOUS | Status: DC
  Administered 2018-10-12 – 2018-10-30 (×55): via TOPICAL
  Filled 2018-10-11 (×5): qty 15

## 2018-10-11 MED ORDER — LEVOTHYROXINE SODIUM 75 MCG PO TABS
200.0000 ug | ORAL_TABLET | Freq: Every day | ORAL | Status: DC
  Administered 2018-10-12 – 2018-10-30 (×18): 200 ug via ORAL
  Filled 2018-10-11 (×19): qty 2

## 2018-10-11 MED ORDER — SENNA 8.6 MG PO TABS
2.0000 | ORAL_TABLET | Freq: Once | ORAL | Status: AC
  Administered 2018-10-12: 17.2 mg via ORAL
  Filled 2018-10-11: qty 2

## 2018-10-11 MED ORDER — OXYCODONE HCL 5 MG PO TABS
5.0000 mg | ORAL_TABLET | Freq: Four times a day (QID) | ORAL | Status: DC | PRN
  Administered 2018-10-12 – 2018-10-30 (×39): 5 mg via ORAL
  Filled 2018-10-11 (×41): qty 1

## 2018-10-11 MED ORDER — SODIUM CHLORIDE 0.9 % IV SOLN
450.0000 mg | Freq: Once | INTRAVENOUS | Status: AC
  Administered 2018-10-11: 450 mg via INTRAVENOUS
  Filled 2018-10-11: qty 45

## 2018-10-11 NOTE — Progress Notes (Signed)
CRITICAL VALUE ALERT  Critical Value:  POSITIVE COVID  Date & Time Notied: 10/11/18 , 1600  Provider Notified: yes  Orders Received/Actions taken: yes

## 2018-10-11 NOTE — H&P (Signed)
History and Physical    Stephanie Combs NIO:[id number] DOB: [birth year] DOA: 10/11/2018 PCP: [name], MD Patient coming from: Home  Chief Complaint: Weight gain, shortness of breath  HPI: Stephanie Combs is a 68 y.o. female with medical history significant of diastolic heart failure, anxiety, COPD, diabetes mellitus, chronic respiratory failure on 2 L via nasal cannula, lung and colon cancer currently receiving chemo and is starting radiation therapy today.  Patient presented secondary to a 10 pound weight gain over the last week.  She is try to manage this with oral Lasix.  Reports some associated shortness of breath as well.  She reports no change in her dietary intake.  She does not report any recent illnesses but did feel little bit malaise yesterday night.  She has associated lower extremity swelling with weeping which is chronic.  None per discussion with her oncologist, her lower extremity edema appears to be worsened.  Review of Systems: Review of Systems  Constitutional: Positive for malaise/fatigue. Negative for chills and fever.  Respiratory: Positive for shortness of breath. Negative for cough.   Cardiovascular: Negative for chest pain.  Gastrointestinal: Negative for abdominal pain, constipation, diarrhea, nausea and vomiting.  All other systems reviewed and are negative.   Past Medical History:  Diagnosis Date  . Anxiety   . CAD (coronary artery disease)    a. s/p DES to LAD and angioplasty to D1 in 05/2017  . CHF (congestive heart failure) (HCC)    Diastolic  . Chronic back pain   . Chronic pain   . COPD (chronic obstructive pulmonary disease) ([place])   . Degenerative disk disease   . Diabetes mellitus without complication ([place])   . History of medication noncompliance 11/2017   "the patient frequently self-adjusts medications without notifying her physicians"  . Hypertension   . Hypothyroidism   . On home O2    2.5 L N/C prn  . Pedal edema     Past Surgical  History:  Procedure Laterality Date  . BIOPSY  09/09/2018   Procedure: BIOPSY;  Surgeon: Daneil Dolin, MD;  [facility];  Service: Endoscopy;;  gastric esophageal hepatic flexure colon  . COLONOSCOPY WITH PROPOFOL N/A 09/09/2018   Procedure: COLONOSCOPY WITH PROPOFOL;  Surgeon: Daneil Dolin, MD;  [facility];  Service: Endoscopy;  Laterality: N/A;  . CORONARY STENT INTERVENTION N/A 05/22/2017   Procedure: CORONARY STENT INTERVENTION;  Surgeon: Martinique, Peter M, MD;  Location: [facility];  Service: Cardiovascular;  Laterality: N/A;  . ESOPHAGOGASTRODUODENOSCOPY (EGD) WITH PROPOFOL N/A 09/09/2018   Procedure: ESOPHAGOGASTRODUODENOSCOPY (EGD) WITH PROPOFOL;  Surgeon: Daneil Dolin, MD;  [facility];  Service: Endoscopy;  Laterality: N/A;  . HERNIA REPAIR    . POLYPECTOMY  09/09/2018   Procedure: POLYPECTOMY;  Surgeon: Daneil Dolin, MD;  [facility];  Service: Endoscopy;;  colon  . RIGHT/LEFT HEART CATH AND CORONARY ANGIOGRAPHY N/A 05/19/2017   Procedure: RIGHT/LEFT HEART CATH AND CORONARY ANGIOGRAPHY;  Surgeon: Leonie Man, MD;  Location: [facility];  Service: Cardiovascular;  Laterality: N/A;  . TUBAL LIGATION    . VIDEO BRONCHOSCOPY WITH ENDOBRONCHIAL ULTRASOUND Left 09/14/2018   Procedure: VIDEO BRONCHOSCOPY WITH ENDOBRONCHIAL ULTRASOUND with biopsies;  Surgeon: Margaretha Seeds, MD;  Location: [facility];  Service: Thoracic;  Laterality: Left;     reports that she quit smoking about 3 years ago. Her smoking use included cigarettes. She has a 67.50 pack-year smoking history. She  has never used smokeless tobacco. She reports that she does not drink alcohol or use drugs.  Allergies  Allergen Reactions  . Dilaudid [Hydromorphone Hcl] Itching  . Midodrine Hcl Swelling    After one dose had anaphylactic reaction, had to call EMS.  Marland Kitchen Actifed Cold-Allergy [Chlorpheniramine-Phenylephrine]     "I was sick and red and it didn't  agree with me at all"  . Doxycycline Nausea And Vomiting  . Other Cough    Pt states she is allergic to ragweed and that she starts coughing and sneezing like crazy  . Penicillins Hives and Itching    Tolerates Rocephin Has patient had a PCN reaction causing immediate rash, facial/tongue/throat swelling, SOB or lightheadedness with hypotension: Yes Has patient had a PCN reaction causing severe rash involving mucus membranes or skin necrosis: No Has patient had a PCN reaction that required hospitalization Yes Has patient had a PCN reaction occurring within the last 10 years: No If all of the above answers are "NO", then may proceed with Cephalosporin use.   . Reglan [Metoclopramide] Itching  . Valium [Diazepam] Itching  . Vistaril [Hydroxyzine Hcl] Itching    Family History  Problem Relation Age of Onset  . Stroke Mother        deceased 38   . Heart attack Father        deceased 89   . Diabetes Brother   . Cerebral palsy Brother   . Pneumonia Brother   . Diabetes Other   . Heart attack Other   . Colon cancer Neg Hx    Prior to Admission medications   Medication Sig Start Date End Date Taking? Authorizing Provider  albuterol (PROAIR HFA) 108 (90 Base) MCG/ACT inhaler Inhale 2 puffs into the lungs every 4 (four) hours as needed for wheezing or shortness of breath. For shortness of breath 09/22/18   Eugenie Filler, MD  albuterol (PROVENTIL) (2.5 MG/3ML) 0.083% nebulizer solution Take 2.5 mg by nebulization 2 (two) times daily as needed for wheezing or shortness of breath.     [provider]  allopurinol (ZYLOPRIM) 300 MG tablet Take 1 tablet (300 mg total) by mouth daily for 15 days. 09/21/18 10/06/18  Arrien, Jimmy Picket, MD  ALPRAZolam Duanne Moron) 1 MG tablet Take 1 mg by mouth 3 (three) times daily as needed for anxiety or sleep.  01/05/17   [provider]  aspirin EC 81 MG tablet Take 81 mg by mouth daily.    [provider]  atorvastatin (LIPITOR) 80  MG tablet Take 1 tablet (80 mg total) by mouth daily at 6 PM. Patient taking differently: Take 80 mg by mouth every morning.  05/26/17   [name], MD  Cholecalciferol (VITAMIN D3) 5000 units CAPS Take 1 capsule (5,000 Units total) by mouth daily. 08/19/16   Cassandria Anger, MD  docusate sodium (COLACE) 100 MG capsule Take 3 capsules (300 mg total) by mouth daily for 30 days. 09/21/18 10/21/18  Arrien, Jimmy Picket, MD  furosemide (LASIX) 40 MG tablet Take 1 tablet (40 mg total) by mouth 2 (two) times daily. 09/22/18   Eugenie Filler, MD  gabapentin (NEURONTIN) 600 MG tablet Take 600 mg by mouth 4 (four) times daily.    [provider]  Hydrocortisone (GERHARDT'S BUTT CREAM) CREA Apply 1 application topically 3 (three) times daily. 09/22/18   Eugenie Filler, MD  insulin aspart (NOVOLOG) 100 UNIT/ML injection Inject 1-18 Units into the skin 3 (three) times daily with meals. Patient taking  differently: Inject 1-20 Units into the skin 3 (three) times daily with meals.  04/09/18   Johnson, Clanford L, MD  insulin detemir (LEVEMIR) 100 UNIT/ML injection Inject 0.4 mLs (40 Units total) into the skin at bedtime. 11/26/17   Orson Eva, MD  levothyroxine (SYNTHROID, LEVOTHROID) 200 MCG tablet Take 1 tablet by mouth daily. 06/24/18   [provider]  nitroGLYCERIN (NITROSTAT) 0.4 MG SL tablet Place 0.4 mg under the tongue every 5 (five) minutes as needed for chest pain.  03/26/18   [provider]  nystatin (MYCOSTATIN/NYSTOP) powder Apply topically 2 (two) times daily. 10/10/16   Eber Jones, MD  Omega-3 Fatty Acids (FISH OIL PO) Take 1 capsule by mouth daily.     [provider]  ondansetron (ZOFRAN) 8 MG tablet Take 1 tablet (8 mg total) by mouth every 8 (eight) hours as needed for nausea or vomiting. 09/22/18   Eugenie Filler, MD  oxyCODONE-acetaminophen (PERCOCET) 10-325 MG tablet Take 1 tablet by mouth every 6 (six) hours as needed for pain.     [provider]  OXYGEN Inhale 2 L into the lungs continuous.    [provider]  pantoprazole (PROTONIX) 40 MG tablet Take 1 tablet (40 mg total) by mouth 2 (two) times daily. 10/07/18   Truitt Merle, MD  polyethylene glycol (MIRALAX / GLYCOLAX) 17 g packet Take 17 g by mouth 2 (two) times daily. 09/21/18   Arrien, Jimmy Picket, MD  potassium chloride (MICRO-K) 10 MEQ CR capsule Take 2 capsules (20 mEq total) by mouth 2 (two) times daily. *May take one additional tablet as needed for cramping 04/23/18   Herminio Commons, MD  senna (SENOKOT) 8.6 MG TABS tablet Take 2 tablets (17.2 mg total) by mouth 2 (two) times daily with a meal. 09/21/18   Arrien, Jimmy Picket, MD  SURE COMFORT INS SYR 1CC/28G 28G X 1/2" 1 ML MISC USE TO INJECT INSULIN UP TO 4 TIMES DAILY. 03/06/17   Cassandria Anger, MD  tiZANidine (ZANAFLEX) 4 MG tablet Take 1 tablet (4 mg total) by mouth every 8 (eight) hours as needed for muscle spasms. 07/08/18   Kathie Dike, MD  topiramate (TOPAMAX) 50 MG tablet Take 150 mg by mouth every evening.  03/18/18   [provider]  umeclidinium-vilanterol (ANORO ELLIPTA) 62.5-25 MCG/INH AEPB Inhale 1 puff into the lungs daily. Patient taking differently: Inhale 1 puff into the lungs daily as needed (for shortness of breath).  05/26/17   [name], MD   Vitals: Temp 98.7 F (37.1 C) 98.7 F (37.1 C)  Pulse Rate 86 107Abnormal   Resp 18 18  BP: Systolic 102HENIDPOE  423NTIRWERX   BP: Diastolic 54MGQQPYPP  50DTOIZTIW   SpO2 96 % 96 %     Physical Exam:  Physical Exam Vitals signs reviewed.  Constitutional:      General: She is not in acute distress.    Appearance: She is well-developed. She is not diaphoretic.  HENT:     Mouth/Throat:     Mouth: Mucous membranes are moist.     Tongue: No lesions.     Pharynx: No oropharyngeal exudate or posterior oropharyngeal erythema.  Eyes:     Conjunctiva/sclera: Conjunctivae normal.     Pupils: Pupils  are equal, round, and reactive to light.  Neck:     Musculoskeletal: Full passive range of motion without pain, normal range of motion and neck supple.  Cardiovascular:     Rate and Rhythm: Normal rate and  regular rhythm.     Heart sounds: Murmur present. Systolic murmur present with a grade of 2/6.  Pulmonary:     Effort: Pulmonary effort is normal. No respiratory distress.     Breath sounds: Decreased breath sounds, rhonchi (mild inspiratory) and rales (mild) present. No wheezing.  Abdominal:     General: Bowel sounds are normal. There is no distension.     Palpations: Abdomen is soft.     Tenderness: There is no abdominal tenderness. There is no guarding or rebound.  Musculoskeletal: Normal range of motion.        General: No tenderness.     Right lower leg: Edema (massive) present.     Left lower leg: Edema (massive) present.     Comments: LE weeping  Lymphadenopathy:     Cervical: No cervical adenopathy.  Skin:    General: Skin is warm and dry.  Neurological:     Mental Status: She is alert and oriented to person, place, and time.  Psychiatric:        Mood and Affect: Mood normal.        Behavior: Behavior normal.    Labs on Admission: I have personally reviewed following labs and imaging studies  CBC: Recent Labs  Lab 10/07/18 1050 10/11/18 0744  WBC 17.3* 15.5*  NEUTROABS 12.5* 13.2*  HGB 8.5* 8.4*  HCT 29.5* 29.3*  MCV 93.9 96.1  PLT 251 268    Basic Metabolic Panel: Recent Labs  Lab 10/07/18 1050 10/11/18 0744  NA 142 143  K 4.3 4.1  CL 104 106  CO2 29 27  GLUCOSE 137* 89  BUN 22 20  CREATININE 1.88* 1.47*  CALCIUM 8.7* 8.6*    GFR: Estimated Creatinine Clearance: 52.4 mL/min (A) (by C-G formula based on SCr of 1.47 mg/dL (H)).  Liver Function Tests: Recent Labs  Lab 10/07/18 1050 10/11/18 0744  AST 14* 11*  ALT 8 8  ALKPHOS 147* 131*  BILITOT 0.3 0.3  PROT 6.7 6.4*  ALBUMIN 3.4* 3.3*   No results for input(s): LIPASE, AMYLASE in the  last 168 hours. No results for input(s): AMMONIA in the last 168 hours.  Coagulation Profile: No results for input(s): INR, PROTIME in the last 168 hours.  Cardiac Enzymes: No results for input(s): CKTOTAL, CKMB, CKMBINDEX, TROPONINI in the last 168 hours.  BNP (last 3 results) No results for input(s): PROBNP in the last 8760 hours.  HbA1C: No results for input(s): HGBA1C in the last 72 hours.  CBG: No results for input(s): GLUCAP in the last 168 hours.  Lipid Profile: No results for input(s): CHOL, HDL, LDLCALC, TRIG, CHOLHDL, LDLDIRECT in the last 72 hours.  Thyroid Function Tests: No results for input(s): TSH, T4TOTAL, FREET4, T3FREE, THYROIDAB in the last 72 hours.  Anemia Panel: No results for input(s): VITAMINB12, FOLATE, FERRITIN, TIBC, IRON, RETICCTPCT in the last 72 hours.  Urine analysis:    Component Value Date/Time   COLORURINE YELLOW 04/05/2018 1559   APPEARANCEUR HAZY (A) 04/05/2018 1559   LABSPEC >1.030 (H) 04/05/2018 1559   PHURINE 5.5 04/05/2018 1559   GLUCOSEU NEGATIVE 04/05/2018 1559   HGBUR SMALL (A) 04/05/2018 1559   BILIRUBINUR SMALL (A) 04/05/2018 1559   KETONESUR 15 (A) 04/05/2018 1559   PROTEINUR 100 (A) 04/05/2018 1559   UROBILINOGEN 0.2 12/04/2014 0650   NITRITE NEGATIVE 04/05/2018 1559   LEUKOCYTESUR SMALL (A) 04/05/2018 1559     Radiological Exams on Admission: No results found.  Assessment/Plan Principal Problem:   Acute on  chronic diastolic CHF (congestive heart failure) (HCC) Active Problems:   COPD (chronic obstructive pulmonary disease) (HCC)   Morbid obesity ([place])   DM type 2 (diabetes mellitus, type 2) (HCC)   CKD (chronic kidney disease), stage III (HCC)   Hypothyroidism   Anemia in CKD (chronic kidney disease)   Anxiety   Chronic respiratory failure with hypoxia (HCC)   CAD (coronary artery disease)   Chronic pain   Small cell lung cancer, left upper lobe (HCC)   Acute on chronic diastolic heart failure Appears to  be a mild exacerbation with significant weight gain. Dry weight appears to be 316 lbs from about two weeks ago on chart review. She is up at least 10 lbs from three days ago but today, appears up a total of 20 lbs. Last Transthoracic Echocardiogram from 06/2018 significant for an EF of 60-65%. -Start Lasix 40 mg IV BID -Daily weights; strict in/out -Daily BMP -Potassium chloride BID  Diabetes mellitus Currently on chronic insulin therapy with a regimen of Levemir 40 units qhs and Novolog TID with meals as a sliding scale -Continue Levemir with a 20% decrease to 32 units QHS -SSI resistant scale  Small cell lung (LUL) Right colon cancer (hepatic flexure) Not a candidate for surgery secondary to high risk. Currently undergoing chemotherapy and is starting XRT today.  CAD Hyperlipidemia Hypertension -Continue Lipitor  COPD Chronic respiratory failure with hypoxia Stable.Does not appear to have an exaerbation. -Continue Anoro Ellipta, Duoneb prn -Continue oxygen therapy  Hypothyroidism -Continue Synthroid  Chronic pain Rheumatoid arthritis -Continue Percocet -Decrease to gabapentin 600 mg TID secondary to renal dysfunction  CKD stage III Baseline creatinine of about 1.4-1.5. Stable  Anemia of chronic disease Secondary to kidney disease. Stable.  Leukocytosis Unknown etiology. No evidence of infection.  Depression/anxiety -Continue Xanax TID prn  Morbid obesity BMI of 62.68 kg/m2. Some of this is from fluid overload.  DVT prophylaxis: Lovenox Code Status: DNR Family Communication: None Disposition Plan: Telemetry Consults called: None Admission status: Inpatient   Cordelia Poche, MD [facility] 10/11/2018, 9:26 AM

## 2018-10-11 NOTE — Progress Notes (Signed)
OK to treat with labs today per MD Burr Medico and will be treated and then will be admitted when treatment is completed. MD hospitalist at chairside now evaluating patient.

## 2018-10-11 NOTE — Progress Notes (Signed)
Per Dr. Burr Medico, Carboplatin dose today to be 450mg  based on calculating IBW and CrCl.   Jalene Mullet, PharmD PGY2 Hematology/ Oncology Pharmacy Resident 10/11/2018 8:57 AM

## 2018-10-11 NOTE — Progress Notes (Addendum)
Patient scheduled for port and treat today at 1340. Informed Shona Simpson, PA-C that patient will be admitted s/p infusion then inquired about intent to treat. Awaiting response from [place], [place]. Will inform [facility] of findings once aware.  Informed Jarrett Soho, RT on L3 that per Shona Simpson, PA-C the patient should be treated today. She verbalized understanding. Then, phoned Chelsea, RN in infusion room caring for patient and requested she call [facility]3 (provided her with their direct number) so that rad staff could retrieve and treat patient before floor admission. She verbalized understanding.

## 2018-10-11 NOTE — Patient Instructions (Signed)
[facility] Discharge Instructions for Patients Receiving Chemotherapy  Today you received the following chemotherapy agents: Carboplatin, etoposide.  To help prevent nausea and vomiting after your treatment, we encourage you to take your nausea medication as prescribed.   If you develop nausea and vomiting that is not controlled by your nausea medication, call the clinic.   BELOW ARE SYMPTOMS THAT SHOULD BE REPORTED IMMEDIATELY:  *FEVER GREATER THAN 100.5 F  *CHILLS WITH OR WITHOUT FEVER  NAUSEA AND VOMITING THAT IS NOT CONTROLLED WITH YOUR NAUSEA MEDICATION  *UNUSUAL SHORTNESS OF BREATH  *UNUSUAL BRUISING OR BLEEDING  TENDERNESS IN MOUTH AND THROAT WITH OR WITHOUT PRESENCE OF ULCERS  *URINARY PROBLEMS  *BOWEL PROBLEMS  UNUSUAL RASH Items with * indicate a potential emergency and should be followed up as soon as possible.  Feel free to call the clinic should you have any questions or concerns. The clinic phone number is [phone].  Please show the [facility] at check-in to the Emergency Department and triage nurse.

## 2018-10-11 NOTE — Progress Notes (Signed)
As per the patient's wishes the following people were notified that patient is in the hospital:   [facility] [address] [place] [phone]  Tinnie Gens [phone]

## 2018-10-11 NOTE — Progress Notes (Signed)
Oncology brief note   Pt was admitted from our cancer center to [place] this afternoon. I was alerted about pt's positive COVID-19 test result around 5pm. She was transferred to 4W. CXR was obtained tonight, no radiographic evidence for multifocal pneumonia or consolidation.  She does have cardiomegaly with mild vascular congestion.   She did received C2D1 chemo (carboplatin and etoposide) and first dose radiation today before hospital admission and COVID testing. Given her COVID(+) her high risk of severe case COVID due to her medical comorbilities and recent chemo, I will hold her Day 2 and 3 chemo for the next 2 days. I plan to discuss with her tomorrow.   I previously discussed code status before, and she agreed DNR/DNI.   I have informed other staff who had close contact with her today in our cancer center.   I will f/u closely when she is in the hospital.   Truitt Merle  10/11/2018

## 2018-10-11 NOTE — Progress Notes (Signed)
RN notified PCP on call for orders for Stephanie Combs"s Butt Cream and Nystatin Powder.  Patient stated that she uses Interdry too, so RN will get an order for Interdry.

## 2018-10-11 NOTE — Telephone Encounter (Signed)
No los per 6/1.

## 2018-10-12 ENCOUNTER — Encounter: Payer: Self-pay | Admitting: Hematology

## 2018-10-12 ENCOUNTER — Encounter (HOSPITAL_COMMUNITY): Payer: Self-pay | Admitting: *Deleted

## 2018-10-12 ENCOUNTER — Ambulatory Visit: Payer: Medicare Other

## 2018-10-12 ENCOUNTER — Inpatient Hospital Stay: Payer: Medicare Other

## 2018-10-12 DIAGNOSIS — M069 Rheumatoid arthritis, unspecified: Secondary | ICD-10-CM | POA: Diagnosis present

## 2018-10-12 DIAGNOSIS — C182 Malignant neoplasm of ascending colon: Secondary | ICD-10-CM | POA: Diagnosis present

## 2018-10-12 DIAGNOSIS — F419 Anxiety disorder, unspecified: Secondary | ICD-10-CM | POA: Diagnosis present

## 2018-10-12 DIAGNOSIS — T451X5A Adverse effect of antineoplastic and immunosuppressive drugs, initial encounter: Secondary | ICD-10-CM | POA: Diagnosis present

## 2018-10-12 DIAGNOSIS — U071 COVID-19: Secondary | ICD-10-CM | POA: Diagnosis not present

## 2018-10-12 DIAGNOSIS — L0231 Cutaneous abscess of buttock: Secondary | ICD-10-CM | POA: Diagnosis not present

## 2018-10-12 DIAGNOSIS — J9611 Chronic respiratory failure with hypoxia: Secondary | ICD-10-CM | POA: Diagnosis not present

## 2018-10-12 DIAGNOSIS — I2510 Atherosclerotic heart disease of native coronary artery without angina pectoris: Secondary | ICD-10-CM | POA: Diagnosis not present

## 2018-10-12 DIAGNOSIS — R52 Pain, unspecified: Secondary | ICD-10-CM | POA: Diagnosis not present

## 2018-10-12 DIAGNOSIS — R630 Anorexia: Secondary | ICD-10-CM | POA: Diagnosis not present

## 2018-10-12 DIAGNOSIS — A0839 Other viral enteritis: Secondary | ICD-10-CM | POA: Diagnosis not present

## 2018-10-12 DIAGNOSIS — G8929 Other chronic pain: Secondary | ICD-10-CM | POA: Diagnosis present

## 2018-10-12 DIAGNOSIS — E785 Hyperlipidemia, unspecified: Secondary | ICD-10-CM | POA: Diagnosis present

## 2018-10-12 DIAGNOSIS — M7989 Other specified soft tissue disorders: Secondary | ICD-10-CM | POA: Diagnosis not present

## 2018-10-12 DIAGNOSIS — N183 Chronic kidney disease, stage 3 (moderate): Secondary | ICD-10-CM | POA: Diagnosis not present

## 2018-10-12 DIAGNOSIS — R0602 Shortness of breath: Secondary | ICD-10-CM | POA: Diagnosis not present

## 2018-10-12 DIAGNOSIS — C3412 Malignant neoplasm of upper lobe, left bronchus or lung: Secondary | ICD-10-CM | POA: Diagnosis present

## 2018-10-12 DIAGNOSIS — I5033 Acute on chronic diastolic (congestive) heart failure: Secondary | ICD-10-CM | POA: Diagnosis not present

## 2018-10-12 DIAGNOSIS — F329 Major depressive disorder, single episode, unspecified: Secondary | ICD-10-CM | POA: Diagnosis present

## 2018-10-12 DIAGNOSIS — J440 Chronic obstructive pulmonary disease with acute lower respiratory infection: Secondary | ICD-10-CM | POA: Diagnosis present

## 2018-10-12 DIAGNOSIS — I130 Hypertensive heart and chronic kidney disease with heart failure and stage 1 through stage 4 chronic kidney disease, or unspecified chronic kidney disease: Secondary | ICD-10-CM | POA: Diagnosis not present

## 2018-10-12 DIAGNOSIS — G894 Chronic pain syndrome: Secondary | ICD-10-CM | POA: Diagnosis not present

## 2018-10-12 DIAGNOSIS — E1122 Type 2 diabetes mellitus with diabetic chronic kidney disease: Secondary | ICD-10-CM | POA: Diagnosis present

## 2018-10-12 DIAGNOSIS — E1142 Type 2 diabetes mellitus with diabetic polyneuropathy: Secondary | ICD-10-CM | POA: Diagnosis present

## 2018-10-12 DIAGNOSIS — D61810 Antineoplastic chemotherapy induced pancytopenia: Secondary | ICD-10-CM | POA: Diagnosis not present

## 2018-10-12 DIAGNOSIS — J449 Chronic obstructive pulmonary disease, unspecified: Secondary | ICD-10-CM | POA: Diagnosis not present

## 2018-10-12 DIAGNOSIS — Z6841 Body Mass Index (BMI) 40.0 and over, adult: Secondary | ICD-10-CM | POA: Diagnosis not present

## 2018-10-12 DIAGNOSIS — C3490 Malignant neoplasm of unspecified part of unspecified bronchus or lung: Secondary | ICD-10-CM | POA: Diagnosis not present

## 2018-10-12 DIAGNOSIS — D631 Anemia in chronic kidney disease: Secondary | ICD-10-CM | POA: Diagnosis not present

## 2018-10-12 LAB — FERRITIN: Ferritin: 177 ng/mL (ref 11–307)

## 2018-10-12 LAB — D-DIMER, QUANTITATIVE: D-Dimer, Quant: 0.64 ug/mL-FEU — ABNORMAL HIGH (ref 0.00–0.50)

## 2018-10-12 LAB — CBC
HCT: 27.8 % — ABNORMAL LOW (ref 36.0–46.0)
Hemoglobin: 8 g/dL — ABNORMAL LOW (ref 12.0–15.0)
MCH: 27.9 pg (ref 26.0–34.0)
MCHC: 28.8 g/dL — ABNORMAL LOW (ref 30.0–36.0)
MCV: 96.9 fL (ref 80.0–100.0)
Platelets: 404 10*3/uL — ABNORMAL HIGH (ref 150–400)
RBC: 2.87 MIL/uL — ABNORMAL LOW (ref 3.87–5.11)
RDW: 23.7 % — ABNORMAL HIGH (ref 11.5–15.5)
WBC: 13.2 10*3/uL — ABNORMAL HIGH (ref 4.0–10.5)
nRBC: 0 % (ref 0.0–0.2)

## 2018-10-12 LAB — BASIC METABOLIC PANEL
Anion gap: 6 (ref 5–15)
BUN: 22 mg/dL (ref 8–23)
CO2: 26 mmol/L (ref 22–32)
Calcium: 8.6 mg/dL — ABNORMAL LOW (ref 8.9–10.3)
Chloride: 106 mmol/L (ref 98–111)
Creatinine, Ser: 1.36 mg/dL — ABNORMAL HIGH (ref 0.44–1.00)
GFR calc Af Amer: 46 mL/min — ABNORMAL LOW (ref >60)
GFR calc non Af Amer: 40 mL/min — ABNORMAL LOW (ref >60)
Glucose, Bld: 351 mg/dL — ABNORMAL HIGH (ref 70–99)
Potassium: 4.7 mmol/L (ref 3.5–5.1)
Sodium: 138 mmol/L (ref 135–145)

## 2018-10-12 LAB — LACTATE DEHYDROGENASE: LDH: 180 U/L (ref 98–192)

## 2018-10-12 LAB — C-REACTIVE PROTEIN: CRP: <0.8 mg/dL (ref <1.0)

## 2018-10-12 LAB — GLUCOSE, CAPILLARY
Glucose-Capillary: 344 mg/dL — ABNORMAL HIGH (ref 70–99)
Glucose-Capillary: 258 mg/dL — ABNORMAL HIGH (ref 70–99)
Glucose-Capillary: 241 mg/dL — ABNORMAL HIGH (ref 70–99)

## 2018-10-12 LAB — BRAIN NATRIURETIC PEPTIDE: B Natriuretic Peptide: 535.6 pg/mL — ABNORMAL HIGH (ref 0.0–100.0)

## 2018-10-12 MED ORDER — ZINC SULFATE 220 (50 ZN) MG PO CAPS
220.0000 mg | ORAL_CAPSULE | Freq: Every day | ORAL | Status: DC
  Administered 2018-10-12 – 2018-10-30 (×19): 220 mg via ORAL
  Filled 2018-10-12 (×21): qty 1

## 2018-10-12 MED ORDER — ONDANSETRON HCL 4 MG/2ML IJ SOLN
4.0000 mg | Freq: Once | INTRAMUSCULAR | Status: AC
  Administered 2018-10-13: 4 mg via INTRAVENOUS
  Filled 2018-10-12: qty 2

## 2018-10-12 MED ORDER — TIZANIDINE HCL 4 MG PO TABS
4.0000 mg | ORAL_TABLET | Freq: Once | ORAL | Status: AC
  Administered 2018-10-12: 4 mg via ORAL
  Filled 2018-10-12: qty 1

## 2018-10-12 MED ORDER — INSULIN DETEMIR 100 UNIT/ML ~~LOC~~ SOLN
40.0000 [IU] | Freq: Every day | SUBCUTANEOUS | Status: DC
  Administered 2018-10-12: 40 [IU] via SUBCUTANEOUS
  Filled 2018-10-12: qty 0.4

## 2018-10-12 MED ORDER — FUROSEMIDE 10 MG/ML IJ SOLN
40.0000 mg | Freq: Two times a day (BID) | INTRAMUSCULAR | Status: DC
  Administered 2018-10-12 – 2018-10-13 (×4): 40 mg via INTRAVENOUS
  Filled 2018-10-12 (×4): qty 4

## 2018-10-12 MED ORDER — VITAMIN C 500 MG PO TABS
500.0000 mg | ORAL_TABLET | Freq: Two times a day (BID) | ORAL | Status: DC
  Administered 2018-10-12 – 2018-10-30 (×37): 500 mg via ORAL
  Filled 2018-10-12 (×37): qty 1

## 2018-10-12 NOTE — Progress Notes (Signed)
There are no foundations offering copay assistance for pt's Dx and the type of ins she has.  I emailed Ailene Ravel and Jodell Cipro in the radiation department requesting they reach out to her regarding the [place].

## 2018-10-12 NOTE — Progress Notes (Signed)
Inpatient Diabetes Program Recommendations  AACE/ADA: New Consensus Statement on Inpatient Glycemic Control (2015)  Target Ranges:  Prepandial:   less than 140 mg/dL      Peak postprandial:   less than 180 mg/dL (1-2 hours)      Critically ill patients:  140 - 180 mg/dL   Lab Results  Component Value Date   GLUCAP 258 (H) 10/12/2018   HGBA1C 7.5 (H) 08/30/2018    Review of Glycemic Control  Diabetes history: DM 2 Outpatient Diabetes medications: Levemir 40 units, Novolog 0-20 units tid + hs  Current orders for Inpatient glycemic control:  Levemir 40 units Novolog 0-20 units tid  A1c 7.5% on 4/20  Inpatient Diabetes Program Recommendations:    Noted patient received Decadron 10 mg yesterday. Glucose trends in the 300 range.   Levemir dose increased from 32 units up to home dose of 40 units per Dr. Candiss Norse. Will watch glucose trends on current regimen.  Hopefully trends should decrease 24-48 hours after decadron administration.  Thanks,  Tama Headings RN, MSN, BC-ADM Inpatient Diabetes Coordinator Team Pager [phone] (8a-5p)

## 2018-10-12 NOTE — Progress Notes (Signed)
Oncology brief note  I called pt this morning, she is clinically stable, afebrile, no worsening dyspnea and cough.   I explained to pt that we will hold chemo and radiation for now due to her positive COVID-19. She voiced good understanding and agrees with the plan. We will consider restarting chemo and radiation when she recovered from COVID and CHF exacerbation.   I again discussed the code status, and she confirmed she wishes to be DNR/DNI.   Truitt Merle  10/12/2018 10:42 AM

## 2018-10-12 NOTE — Progress Notes (Signed)
CBG at dinner 6/2 was 204. System not connecting. Patient given 7 units per sliding scale.   Will call pCOT.

## 2018-10-12 NOTE — TOC Initial Note (Signed)
Transition of Care [facility]) - Initial/Assessment Note    Patient Details  Name: Stephanie Combs MRN: [id number] Date of Birth: [birth year]  [facility]) CM/SW Contact:    Ninfa Meeker, RN Phone Number: 10/12/2018, 12:02 PM  Clinical Narrative:   68 yr old female admitted with COVID 62 and CHF exacerbation.. Patient has been receiving chemo and radiation for lund and colon Ca, which will now be on hold until she medically improves. May God bless her to do so. Case manager will continue to monitor for appropriate disposition.                      Patient Goals and CMS Choice        Expected Discharge Plan and Services                                                Prior Living Arrangements/Services                       Activities of Daily Living Home Assistive Devices/Equipment: Bedside commode/3-in-1, CBG Meter, Grab bars around toilet, Grab bars in shower, Oxygen, Raised toilet seat with rails, Shower chair with back, Walker (specify type) ADL Screening (condition at time of admission) Patient's cognitive ability adequate to safely complete daily activities?: Yes Is the patient deaf or have difficulty hearing?: No Does the patient have difficulty seeing, even when wearing glasses/contacts?: No Does the patient have difficulty concentrating, remembering, or making decisions?: No Patient able to express need for assistance with ADLs?: Yes Does the patient have difficulty dressing or bathing?: Yes Independently performs ADLs?: No Communication: Independent Dressing (OT): Needs assistance Is this a change from baseline?: Pre-admission baseline Grooming: Needs assistance Is this a change from baseline?: Change from baseline, expected to last >3 days Feeding: Independent Bathing: Needs assistance Is this a change from baseline?: Change from baseline, expected to last >3 days Toileting: Needs assistance Is this a change from baseline?:  Change from baseline, expected to last >3days In/Out Bed: Needs assistance Is this a change from baseline?: Change from baseline, expected to last >3 days Walks in Home: Needs assistance Is this a change from baseline?: Change from baseline, expected to last >3 days Does the patient have difficulty walking or climbing stairs?: Yes Weakness of Legs: Both Weakness of Arms/Hands: Right  Permission Sought/Granted                  Emotional Assessment              Admission diagnosis:  Fluid Overload Patient Active Problem List   Diagnosis Date Noted  . Acute on chronic diastolic heart failure ([place]) 10/11/2018  . Assistance needed with transportation 09/22/2018  . Primary cancer of hepatic flexure of colon ([place])   . Small cell lung cancer, left upper lobe ([place])   . Colonic mass 09/10/2018  . Colon polyps 09/10/2018  . Cellulitis of leg, left 09/10/2018  . Pain of lower extremity   . Non-intractable vomiting   . Abdominal pain, epigastric   . Heme positive stool   . Acute on chronic diastolic CHF (congestive heart failure) ([place]) 09/06/2018  . Symptomatic anemia 08/31/2018  . Anemia 08/30/2018  . Chronic pain 07/05/2018  . On home O2 07/05/2018  . Opioid dependence ([place]) 07/05/2018  .  CAD (coronary artery disease) 05/14/2018  . AKI (acute kidney injury) ([place]) 04/05/2018  . Cellulitis 01/10/2018  . Cellulitis of right lower extremity 01/07/2018  . Altered mental status 01/06/2018  . Type 2 diabetes mellitus with hypoglycemia without coma ([place]) 11/24/2017  . Anxiety 11/24/2017  . Chronic respiratory failure with hypoxia ([place]) 11/24/2017  . Syncope 10/24/2017  . Bradycardia   . Syncope due to orthostatic hypotension 09/04/2017  . Obesity, Class III, BMI 40-49.9 (morbid obesity) ([place]) 09/04/2017  . Abnormal nuclear stress test 05/19/2017  . Atypical angina ([place]) 05/19/2017  . Edema 05/15/2017  . Skin ulcer ([place]) 01/30/2017  . Tinea cruris 10/10/2016  . Vitamin D  deficiency 08/19/2016  . Personal history of noncompliance with medical treatment, presenting hazards to health 06/17/2016  . CAP (community acquired pneumonia) 07/25/2015  . Pressure ulcer 05/02/2015  . Hyponatremia 05/01/2015  . Acute-on-chronic kidney injury ([place]) 05/01/2015  . Uncontrolled type 2 diabetes mellitus with stage 3 chronic kidney disease ([place]) 05/01/2015  . Cellulitis of foot, right 03/24/2015  . Peripheral edema 12/04/2014  . Acute renal failure superimposed on stage 3 chronic kidney disease ([place]) 11/24/2014  . Bilateral lower extremity edema   . Diabetic ulcer of right great toe ([place])   . Foot ulcer due to secondary DM ([place]) 11/07/2014  . Diabetic ulcer of right foot ([place]) 11/07/2014  . Edema of lower extremity 05/27/2013  . CKD (chronic kidney disease), stage III ([place]) 04/14/2013  . Anemia in CKD (chronic kidney disease) 04/14/2013  . Chest pain 04/14/2013  . Dyspnea 04/14/2013  . Chronic diastolic CHF (congestive heart failure) ([place])   . Hypothyroidism   . COPD (chronic obstructive pulmonary disease) ([place]) 08/07/2012  . Morbid obesity ([place]) 08/07/2012  . DM type 2 (diabetes mellitus, type 2) ([place]) 08/07/2012  . HTN (hypertension), benign 08/07/2012   PCP:  [name], MD Pharmacy:   [facility], [place] [address] [name] [address] Phone: [phone] Fax: [phone]     Social Determinants of Health (SDOH) Interventions    Readmission Risk Interventions Readmission Risk Prevention Plan 09/02/2018 08/31/2018  Transportation Screening - Complete  PCP or Specialist appointment within 3-5 days of discharge Complete -  [place] or [place] - Complete  SW Recovery Care/Counseling Consult - Complete  [place] - Not Applicable  Some recent data might be hidden

## 2018-10-12 NOTE — Progress Notes (Signed)
Stephanie Combs, is a 68 y.o. female, DOB - 1950-06-14, HEN:[id number]  Patient is a 68 year old female with history of chronic diastolic heart failure EF on recent echocardiogram 60%, small cell lung cancer, colon cancer, currently on chemotherapy under the care of Dr. Burr Medico, anxiety, COPD, diabetes mellitus, chronic respiratory failure on 2 L of oxygen via nasal cannula who presented to the emergency department with history of 10 pound weight gain over last week.    Is been diagnosed with acute on chronic diastolic CHF and being treated with IV Lasix with improvement, incidentally her COVID-19 test Came positive but chest x-ray stable she is afebrile.  Currently monitor her and observe her with inflammatory markers and clinically.  Note her inflammatory markers could be falsely elevated due to her underlying malignancy.  She has been seen this admission by her oncologist Dr. Burr Medico at [facility].  Currently no active chemo or radiation advised per Dr. Annamaria Boots due to her COVID-19 infection.  Patient overall is DNR.  Continue diuresis and monitor.  Appropriate labs have been ordered.    Vitals:   10/11/18 1947 10/11/18 2000 10/12/18 0509 10/12/18 0620  BP: (!) 150/66  140/74   Pulse: 93  81   Resp: 20  18   Temp: 98.7 F (37.1 C)  98.6 F (37 C)   TempSrc: Oral  Oral   SpO2: 99%  99%   Weight:    (!) 150.2 kg  Height:  5' 1.5" (1.562 m)          Data Review   Micro Results Recent Results (from the past 240 hour(s))  SARS Coronavirus 2 (CEPHEID- Performed in [place] hospital lab), Hosp Order     Status: Abnormal   Collection Time: 10/11/18  3:34 PM  Result Value Ref Range Status   SARS Coronavirus 2 POSITIVE (A) NEGATIVE Final    Comment: RESULT CALLED TO, READ BACK BY AND VERIFIED WITH: [place] [id number] @ 1540 BY J SCOTTON (NOTE) If result is NEGATIVE SARS-CoV-2 target nucleic acids  are NOT DETECTED. The SARS-CoV-2 RNA is generally detectable in upper and lower  respiratory specimens during the acute phase of infection. The lowest  concentration of SARS-CoV-2 viral copies this assay can detect is 250  copies / mL. A negative result does not preclude SARS-CoV-2 infection  and should not be used as the sole basis for treatment or other  patient management decisions.  A negative result may occur with  improper specimen collection / handling, submission of specimen other  than nasopharyngeal swab, presence of viral mutation(s) within the  areas targeted by this assay, and inadequate number of viral copies  (<250 copies / mL). A negative result must be combined with clinical  observations, patient history, and epidemiological information. If result is POSITIVE SARS-CoV-2 target nucleic acids are DETEC TED. The SARS-CoV-2 RNA is generally detectable in upper and lower  respiratory specimens during the acute phase of infection.  Positive  results are indicative of active infection with SARS-CoV-2.  Clinical  correlation with patient history and other diagnostic information is  necessary to determine patient infection status.  Positive results do  not rule out bacterial infection or co-infection with other viruses. If result is PRESUMPTIVE POSTIVE SARS-CoV-2 nucleic acids MAY BE PRESENT.   A presumptive positive result was obtained on the submitted specimen  and confirmed on repeat testing.  While 2019 novel coronavirus  (SARS-CoV-2) nucleic acids may be present in the submitted sample  additional confirmatory testing  may be necessary for epidemiological  and / or clinical management purposes  to differentiate between  SARS-CoV-2 and other Sarbecovirus currently known to infect humans.  If clinically indicated additional testing with an alternate test  methodology ([id number] [id number]) is advised. The SARS-CoV-2 RNA is generally  detectable in upper and lower respiratory specimens  during the acute  phase of infection. The expected result is Negative. Fact Sheet for Patients:  StrictlyIdeas.no Fact Sheet for Healthcare Providers: BankingDealers.co.za This test is not yet approved or cleared by the [place] FDA and has been authorized for detection and/or diagnosis of SARS-CoV-2 by FDA under an Emergency Use Authorization (EUA).  This EUA will remain in effect (meaning this test can be used) for the duration of the COVID-19 declaration under Section 564(b)(1) of the Act, 21 U.S.C. section 360bbb-3(b)(1), unless the authorization is terminated or revoked sooner. Performed at [facility], [place] [address] [place], [place] [address]     Radiology Reports Dg Chest 2 View  Result Date: 09/22/2018 CLINICAL DATA:  Worsened fatigue and weakness. Cough and shortness of breath. EXAM: CHEST - 2 VIEW COMPARISON:  09/17/2018 FINDINGS: Cardiomegaly. Aortic tortuosity. Pulmonary venous hypertension with early interstitial edema. Small bilateral effusions. Partial collapse of the left upper lobe with left hilar mass as seen previously. IMPRESSION: Left hilar mass with partial collapse of the left upper lobe. New demonstration of pulmonary venous hypertension with early interstitial edema and small effusions. Electronically Signed   By: Nelson Chimes M.D.   On: 09/22/2018 15:21   Dg Chest 2 View  Result Date: 09/17/2018 CLINICAL DATA:  Recent onset of cough. EXAM: CHEST - 2 VIEW COMPARISON:  Chest CT from 6 days ago FINDINGS: Left hilar mass with left upper lobe collapse, new. Normal heart size when accounting for rotation and low volumes. IMPRESSION: Collapse of the left upper lobe in this patient with known hilar mass. Electronically Signed   By: Monte Fantasia M.D.   On: 09/17/2018 11:18   Mr Jeri Cos XB Contrast  Result Date: 09/20/2018 CLINICAL DATA:  Small-cell lung cancer staging. EXAM: MRI HEAD WITHOUT AND  WITH CONTRAST TECHNIQUE: Multiplanar, multiecho pulse sequences of the brain and surrounding structures were obtained without and with intravenous contrast. CONTRAST:  10 mL Gadavist COMPARISON:  Head CT 01/06/2018 FINDINGS: Some sequences are moderately to severely motion degraded despite repeated imaging attempts. Brain: There is no evidence of acute infarct, intracranial hemorrhage, mass, midline shift, or extra-axial fluid collection. Patchy to confluent T2 hyperintensities in the cerebral white matter and pons are nonspecific but compatible with moderate to severe chronic small vessel ischemic disease, mildly progressed from the prior MRI. There is mild cerebral atrophy. No enhancing brain lesions are identified, however sensitivity for detection of small lesions is reduced due to the degree of motion artifact. Vascular: Major intracranial vascular flow voids are preserved with the left vertebral artery being dominant. Skull and upper cervical spine: Diffuse heterogeneity in the bone marrow of the skull with hyperostosis frontalis noted. A few small foci of signal are noted in the skull on diffusion imaging which are new from the prior study with the largest measuring 1 cm in the lateral right frontal region and demonstrating apparent enhancement (series 3, image 38 and series 12, image 79). No destructive skull lesion is identified. Sinuses/Orbits: Unremarkable orbits. Clear paranasal sinuses. Small right mastoid effusion. Other: None. IMPRESSION: 1. Motion degraded examination without brain metastases identified. 2. Small skull metastases not excluded. 3. Moderate to severe chronic  small vessel ischemic disease. Electronically Signed   By: Logan Bores M.D.   On: 09/20/2018 18:56   Dg Chest Port 1 View  Result Date: 10/11/2018 CLINICAL DATA:  Viral pneumonia EXAM: PORTABLE CHEST 1 VIEW COMPARISON:  09/22/2018 FINDINGS: The cardiac size remains enlarged. There is elevation of the right hemidiaphragm. No  focal consolidation. No pneumothorax. There is mild generalized volume overload. The patient is rotated which limits evaluation. There is no acute osseous abnormality. There is atelectasis at the right lung base. IMPRESSION: 1. No radiographic evidence for multifocal pneumonia or a significant area of consolidation. 2. Cardiomegaly with mild vascular congestion. Right basilar airspace opacity is presumed to represent atelectasis or chronic scarring. Electronically Signed   By: Constance Holster M.D.   On: 10/11/2018 20:10    CBC Recent Labs  Lab 10/07/18 1050 10/11/18 0744 10/12/18 0532  WBC 17.3* 15.5* 13.2*  HGB 8.5* 8.4* 8.0*  HCT 29.5* 29.3* 27.8*  PLT 251 358 404*  MCV 93.9 96.1 96.9  MCH 27.1 27.5 27.9  MCHC 28.8* 28.7* 28.8*  RDW 23.0* 23.6* 23.7*  LYMPHSABS 2.1 1.1  --   MONOABS 0.8 0.7  --   EOSABS 0.1 0.1  --   BASOSABS 0.2* 0.1  --     Chemistries  Recent Labs  Lab 10/07/18 1050 10/11/18 0744 10/12/18 0532  NA 142 143 138  K 4.3 4.1 4.7  CL 104 106 106  CO2 29 27 26   GLUCOSE 137* 89 351*  BUN 22 20 22   CREATININE 1.88* 1.47* 1.36*  CALCIUM 8.7* 8.6* 8.6*  AST 14* 11*  --   ALT 8 8  --   ALKPHOS 147* 131*  --   BILITOT 0.3 0.3  --    ------------------------------------------------------------------------------------------------------------------ estimated creatinine clearance is 55.9 mL/min (A) (by C-G formula based on SCr of 1.36 mg/dL (H)). ------------------------------------------------------------------------------------------------------------------ No results for input(s): HGBA1C in the last 72 hours. ------------------------------------------------------------------------------------------------------------------ No results for input(s): CHOL, HDL, LDLCALC, TRIG, CHOLHDL, LDLDIRECT in the last 72 hours. ------------------------------------------------------------------------------------------------------------------ No results for input(s): TSH,  T4TOTAL, T3FREE, THYROIDAB in the last 72 hours.  Invalid input(s): FREET3 ------------------------------------------------------------------------------------------------------------------ No results for input(s): VITAMINB12, FOLATE, FERRITIN, TIBC, IRON, RETICCTPCT in the last 72 hours.  Coagulation profile No results for input(s): INR, PROTIME in the last 168 hours.  No results for input(s): DDIMER in the last 72 hours.  Cardiac Enzymes No results for input(s): CKMB, TROPONINI, MYOGLOBIN in the last 168 hours.  Invalid input(s): CK ------------------------------------------------------------------------------------------------------------------ Invalid input(s): POCBNP

## 2018-10-12 NOTE — Progress Notes (Signed)
PROGRESS NOTE    Stephanie Combs  VOJ:[id number] DOB: [birth year] DOA: 10/11/2018 PCP: [name], MD   Brief Narrative: Patient is a 68 year old female with history of diastolic heart failure, small cell lung cancer, colon cancer, currently on chemotherapy, anxiety, COPD, diabetes mellitus, chronic respiratory failure on 2 L of oxygen via nasal cannula who presented to the emergency department with history of 10 pound weight gain over last week.  She takes  Lasix at home.  She also complained of some shortness of breath, generalized malaise but denied any recent illnesses.Covid-19 came out to be positive.  Assessment & Plan:   Principal Problem:   Acute on chronic diastolic CHF (congestive heart failure) (HCC) Active Problems:   COPD (chronic obstructive pulmonary disease) (HCC)   Morbid obesity (HCC)   DM type 2 (diabetes mellitus, type 2) (HCC)   CKD (chronic kidney disease), stage III (HCC)   Hypothyroidism   Anemia in CKD (chronic kidney disease)   Anxiety   Chronic respiratory failure with hypoxia (HCC)   CAD (coronary artery disease)   Chronic pain   Small cell lung cancer, left upper lobe (HCC)   Acute on chronic diastolic heart failure ([place])   COVID-19: Respiratory status stable.  Afebrile.  Hemodynamically stable.  Currently on baseline oxygen requirement.  Not coughing. Started on zinc and vitamin C.  Acute on chronic diastolic heart failure: Reported of 10 pound weight gain in last week.  She is morbidly obese.  Last transthoracic echo done on 2/20 showed ejection fraction 60-65%.  Started on Lasix 40 mg IV twice daily.  Continue to monitor daily weight, strict ins and outs, daily BMP.  Continue potassium supplementation  Diabetes mellitus type 2: On chronic insulin therapy at home.  On Levemir and NovoLog.  Continue sliding scale insulin and Levemir.  Small cell lung cancer of left upper lobe/right  hepatic flexure colon cancer: Oncology was following.  Currently on  chemotherapy.  Plan is to start on radiation therapy soon but likely needs to be held . Discussed with Dr. Burr Medico who is holding chemotherapy for now until she recovers from Covid-19.  Coronary disease/hyperlipidemia/hypertension: Currently stable.  Continue home medications.  COPD/chronic respiratory failure with hypoxia: Currently stable.  Not on exacerbation right now.  Continue her home inhalers.  Continue PRN nebulization treatment.  Continue oxygen therapy  Hypothyroidism: Continue Synthyroid  Chronic pain syndrome/rheumatoid arthritis: Continue pain management, supportive care  CKD stage III: Baseline creatinine of 1.4-1.5.  Currently stable  Anemia of chronic disease: Most likely secondary to CKD. Hb of 8.  Continue to monitor  Depression/anxiety: Continue Xanax as needed  Morbid obesity: BMI of 62.68.  Fluid overload might have contributed to some of her weight.         DVT prophylaxis: Lovenox Code Status: DNR Family Communication: None present at the bedside Disposition Plan: Transfer to [facility] today   Consultants: Oncology  Procedures:None  Antimicrobials:  Anti-infectives (From admission, onward)   None      Subjective: Patient seen and examined the bedside this morning.  Hemodynamically stable.  Afebrile.  Respiratory status stable.  She was eating her breakfast.  Very frustrated to hear that she is going to be moved to [facility] and we are holding her chemotherapy.  Gently explained that, she needs continued IV diuresis for at least 24 to 48 hours before she can be discharged home .  Objective: Vitals:   10/11/18 1947 10/11/18 2000 10/12/18 0509 10/12/18 0620  BP: (!) 150/66  140/74  Pulse: 93  81   Resp: 20  18   Temp: 98.7 F (37.1 C)  98.6 F (37 C)   TempSrc: Oral  Oral   SpO2: 99%  99%   Weight:    (!) 150.2 kg  Height:  5' 1.5" (1.562 m)      Intake/Output Summary (Last 24 hours) at 10/12/2018 0955 Last data filed at 10/12/2018 0630 Gross per 24 hour   Intake 540 ml  Output 2100 ml  Net -1560 ml   Filed Weights   10/12/18 0620  Weight: (!) 150.2 kg    Examination:  General exam: Not in distress, morbidly obese HEENT:PERRL,Oral mucosa moist, Ear/Nose normal on gross exam Respiratory system: Not tachypneic, not in any kind of respiratory distress, no accessory muscle use. Cardiovascular system: S1 & S2 .No JVD.Severe  pedal edema. Gastrointestinal system: Abdomen is obese, soft and nontender.  Central nervous system: Alert and oriented. No focal neurological deficits. Extremities: Severe bilateral lower extremity edema, no cyanosis Skin: No rashes, lesions or ulcers,no icterus ,no pallor  Data Reviewed: I have personally reviewed following labs and imaging studies  CBC: Recent Labs  Lab 10/07/18 1050 10/11/18 0744 10/12/18 0532  WBC 17.3* 15.5* 13.2*  NEUTROABS 12.5* 13.2*  --   HGB 8.5* 8.4* 8.0*  HCT 29.5* 29.3* 27.8*  MCV 93.9 96.1 96.9  PLT 251 358 357*   Basic Metabolic Panel: Recent Labs  Lab 10/07/18 1050 10/11/18 0744 10/12/18 0532  NA 142 143 138  K 4.3 4.1 4.7  CL 104 106 106  CO2 29 27 26   GLUCOSE 137* 89 351*  BUN 22 20 22   CREATININE 1.88* 1.47* 1.36*  CALCIUM 8.7* 8.6* 8.6*   GFR: Estimated Creatinine Clearance: 55.9 mL/min (A) (by C-G formula based on SCr of 1.36 mg/dL (H)). Liver Function Tests: Recent Labs  Lab 10/07/18 1050 10/11/18 0744  AST 14* 11*  ALT 8 8  ALKPHOS 147* 131*  BILITOT 0.3 0.3  PROT 6.7 6.4*  ALBUMIN 3.4* 3.3*   No results for input(s): LIPASE, AMYLASE in the last 168 hours. No results for input(s): AMMONIA in the last 168 hours. Coagulation Profile: No results for input(s): INR, PROTIME in the last 168 hours. Cardiac Enzymes: No results for input(s): CKTOTAL, CKMB, CKMBINDEX, TROPONINI in the last 168 hours. BNP (last 3 results) No results for input(s): PROBNP in the last 8760 hours. HbA1C: No results for input(s): HGBA1C in the last 72 hours. CBG:  Recent Labs  Lab 10/11/18 1708 10/11/18 2001 10/12/18 0807  GLUCAP 206* 367* 344*   Lipid Profile: No results for input(s): CHOL, HDL, LDLCALC, TRIG, CHOLHDL, LDLDIRECT in the last 72 hours. Thyroid Function Tests: No results for input(s): TSH, T4TOTAL, FREET4, T3FREE, THYROIDAB in the last 72 hours. Anemia Panel: No results for input(s): VITAMINB12, FOLATE, FERRITIN, TIBC, IRON, RETICCTPCT in the last 72 hours. Sepsis Labs: No results for input(s): PROCALCITON, LATICACIDVEN in the last 168 hours.  Recent Results (from the past 240 hour(s))  SARS Coronavirus 2 (CEPHEID- Performed in [place] hospital lab), Hosp Order     Status: Abnormal   Collection Time: 10/11/18  3:34 PM  Result Value Ref Range Status   SARS Coronavirus 2 POSITIVE (A) NEGATIVE Final    Comment: RESULT CALLED TO, READ BACK BY AND VERIFIED WITH: [place] [id number] @ 9030 BY J SCOTTON (NOTE) If result is NEGATIVE SARS-CoV-2 target nucleic acids are NOT DETECTED. The SARS-CoV-2 RNA is generally detectable in upper and lower  respiratory specimens  during the acute phase of infection. The lowest  concentration of SARS-CoV-2 viral copies this assay can detect is 250  copies / mL. A negative result does not preclude SARS-CoV-2 infection  and should not be used as the sole basis for treatment or other  patient management decisions.  A negative result may occur with  improper specimen collection / handling, submission of specimen other  than nasopharyngeal swab, presence of viral mutation(s) within the  areas targeted by this assay, and inadequate number of viral copies  (<250 copies / mL). A negative result must be combined with clinical  observations, patient history, and epidemiological information. If result is POSITIVE SARS-CoV-2 target nucleic acids are DETEC TED. The SARS-CoV-2 RNA is generally detectable in upper and lower  respiratory specimens during the acute phase of infection.  Positive   results are indicative of active infection with SARS-CoV-2.  Clinical  correlation with patient history and other diagnostic information is  necessary to determine patient infection status.  Positive results do  not rule out bacterial infection or co-infection with other viruses. If result is PRESUMPTIVE POSTIVE SARS-CoV-2 nucleic acids MAY BE PRESENT.   A presumptive positive result was obtained on the submitted specimen  and confirmed on repeat testing.  While 2019 novel coronavirus  (SARS-CoV-2) nucleic acids may be present in the submitted sample  additional confirmatory testing may be necessary for epidemiological  and / or clinical management purposes  to differentiate between  SARS-CoV-2 and other Sarbecovirus currently known to infect humans.  If clinically indicated additional testing with an alternate test  methodology (LAB7 453) is advised. The SARS-CoV-2 RNA is generally  detectable in upper and lower respiratory specimens during the acute  phase of infection. The expected result is Negative. Fact Sheet for Patients:  StrictlyIdeas.no Fact Sheet for Healthcare Providers: BankingDealers.co.za This test is not yet approved or cleared by the [place] FDA and has been authorized for detection and/or diagnosis of SARS-CoV-2 by FDA under an Emergency Use Authorization (EUA).  This EUA will remain in effect (meaning this test can be used) for the duration of the COVID-19 declaration under Section 564(b)(1) of the Act, 21 U.S.C. section 360bbb-3(b)(1), unless the authorization is terminated or revoked sooner. Performed at [facility], [place] [address] [place], [place] [address]          Radiology Studies: Dg Chest Port 1 View  Result Date: 10/11/2018 CLINICAL DATA:  Viral pneumonia EXAM: PORTABLE CHEST 1 VIEW COMPARISON:  09/22/2018 FINDINGS: The cardiac size remains enlarged. There is elevation of the  right hemidiaphragm. No focal consolidation. No pneumothorax. There is mild generalized volume overload. The patient is rotated which limits evaluation. There is no acute osseous abnormality. There is atelectasis at the right lung base. IMPRESSION: 1. No radiographic evidence for multifocal pneumonia or a significant area of consolidation. 2. Cardiomegaly with mild vascular congestion. Right basilar airspace opacity is presumed to represent atelectasis or chronic scarring. Electronically Signed   By: Constance Holster M.D.   On: 10/11/2018 20:10        Scheduled Meds: . aspirin EC  81 mg Oral Daily  . atorvastatin  80 mg Oral QPM  . cholecalciferol  5,000 Units Oral Daily  . docusate sodium  300 mg Oral Daily  . enoxaparin (LOVENOX) injection  60 mg Subcutaneous Q24H  . furosemide  40 mg Intravenous BID  . gabapentin  600 mg Oral TID  . Gerhardt's butt cream   Topical TID  . insulin  aspart  0-20 Units Subcutaneous TID WC  . insulin detemir  32 Units Subcutaneous QHS  . levothyroxine  200 mcg Oral Daily  . nystatin   Topical TID  . pantoprazole  40 mg Oral BID  . polyethylene glycol  17 g Oral BID  . potassium chloride  20 mEq Oral BID  . senna  2 tablet Oral BID WC  . umeclidinium-vilanterol  1 puff Inhalation Daily   Continuous Infusions:   LOS: 1 day    Time spent: 35 mins.More than 50% of that time was spent in counseling and/or coordination of care.      Shelly Coss, MD [facility] Pager [phone]  If 7PM-7AM, please contact night-coverage [email or web address] Password [email or web address] 10/12/2018, 9:55 AM

## 2018-10-13 ENCOUNTER — Ambulatory Visit: Payer: Medicare Other

## 2018-10-13 ENCOUNTER — Inpatient Hospital Stay: Payer: Medicare Other

## 2018-10-13 LAB — LACTATE DEHYDROGENASE: LDH: 174 U/L (ref 98–192)

## 2018-10-13 LAB — COMPREHENSIVE METABOLIC PANEL
ALT: 10 U/L (ref 0–44)
AST: 12 U/L — ABNORMAL LOW (ref 15–41)
Albumin: 3.1 g/dL — ABNORMAL LOW (ref 3.5–5.0)
Alkaline Phosphatase: 93 U/L (ref 38–126)
Anion gap: 5 (ref 5–15)
BUN: 29 mg/dL — ABNORMAL HIGH (ref 8–23)
CO2: 28 mmol/L (ref 22–32)
Calcium: 8.4 mg/dL — ABNORMAL LOW (ref 8.9–10.3)
Chloride: 105 mmol/L (ref 98–111)
Creatinine, Ser: 1.37 mg/dL — ABNORMAL HIGH (ref 0.44–1.00)
GFR calc Af Amer: 46 mL/min — ABNORMAL LOW (ref >60)
GFR calc non Af Amer: 40 mL/min — ABNORMAL LOW (ref >60)
Glucose, Bld: 298 mg/dL — ABNORMAL HIGH (ref 70–99)
Potassium: 5.2 mmol/L — ABNORMAL HIGH (ref 3.5–5.1)
Sodium: 138 mmol/L (ref 135–145)
Total Bilirubin: 0.3 mg/dL (ref 0.3–1.2)
Total Protein: 5.8 g/dL — ABNORMAL LOW (ref 6.5–8.1)

## 2018-10-13 LAB — GLUCOSE, CAPILLARY
Glucose-Capillary: 204 mg/dL — ABNORMAL HIGH (ref 70–99)
Glucose-Capillary: 259 mg/dL — ABNORMAL HIGH (ref 70–99)
Glucose-Capillary: 204 mg/dL — ABNORMAL HIGH (ref 70–99)
Glucose-Capillary: 202 mg/dL — ABNORMAL HIGH (ref 70–99)
Glucose-Capillary: 119 mg/dL — ABNORMAL HIGH (ref 70–99)
Glucose-Capillary: 207 mg/dL — ABNORMAL HIGH (ref 70–99)

## 2018-10-13 LAB — D-DIMER, QUANTITATIVE: D-Dimer, Quant: 0.63 ug/mL-FEU — ABNORMAL HIGH (ref 0.00–0.50)

## 2018-10-13 LAB — CBC
HCT: 27.6 % — ABNORMAL LOW (ref 36.0–46.0)
Hemoglobin: 8.2 g/dL — ABNORMAL LOW (ref 12.0–15.0)
MCH: 28.8 pg (ref 26.0–34.0)
MCHC: 29.7 g/dL — ABNORMAL LOW (ref 30.0–36.0)
MCV: 96.8 fL (ref 80.0–100.0)
Platelets: 403 10*3/uL — ABNORMAL HIGH (ref 150–400)
RBC: 2.85 MIL/uL — ABNORMAL LOW (ref 3.87–5.11)
RDW: 24.5 % — ABNORMAL HIGH (ref 11.5–15.5)
WBC: 6.7 10*3/uL (ref 4.0–10.5)
nRBC: 0 % (ref 0.0–0.2)

## 2018-10-13 LAB — CBC WITH DIFFERENTIAL/PLATELET
Abs Immature Granulocytes: 0.06 10*3/uL (ref 0.00–0.07)
Basophils Absolute: 0.1 10*3/uL (ref 0.0–0.1)
Basophils Relative: 1 %
Eosinophils Absolute: 0 10*3/uL (ref 0.0–0.5)
Eosinophils Relative: 0 %
HCT: 23.8 % — ABNORMAL LOW (ref 36.0–46.0)
Hemoglobin: 6.9 g/dL — CL (ref 12.0–15.0)
Immature Granulocytes: 1 %
Lymphocytes Relative: 7 %
Lymphs Abs: 0.5 10*3/uL — ABNORMAL LOW (ref 0.7–4.0)
MCH: 28 pg (ref 26.0–34.0)
MCHC: 29 g/dL — ABNORMAL LOW (ref 30.0–36.0)
MCV: 96.7 fL (ref 80.0–100.0)
Monocytes Absolute: 0.6 10*3/uL (ref 0.1–1.0)
Monocytes Relative: 8 %
Neutro Abs: 6.1 10*3/uL (ref 1.7–7.7)
Neutrophils Relative %: 83 %
Platelets: 365 10*3/uL (ref 150–400)
RBC: 2.46 MIL/uL — ABNORMAL LOW (ref 3.87–5.11)
RDW: 24.6 % — ABNORMAL HIGH (ref 11.5–15.5)
WBC: 7.3 10*3/uL (ref 4.0–10.5)
nRBC: 0 % (ref 0.0–0.2)

## 2018-10-13 LAB — C-REACTIVE PROTEIN: CRP: <0.8 mg/dL (ref <1.0)

## 2018-10-13 LAB — MAGNESIUM: Magnesium: 1.9 mg/dL (ref 1.7–2.4)

## 2018-10-13 LAB — FERRITIN: Ferritin: 201 ng/mL (ref 11–307)

## 2018-10-13 LAB — BRAIN NATRIURETIC PEPTIDE: B Natriuretic Peptide: 473.4 pg/mL — ABNORMAL HIGH (ref 0.0–100.0)

## 2018-10-13 MED ORDER — METOPROLOL TARTRATE 5 MG/5ML IV SOLN
5.0000 mg | Freq: Once | INTRAVENOUS | Status: AC
  Administered 2018-10-13: 5 mg via INTRAVENOUS
  Filled 2018-10-13: qty 5

## 2018-10-13 MED ORDER — INSULIN DETEMIR 100 UNIT/ML ~~LOC~~ SOLN
44.0000 [IU] | Freq: Every day | SUBCUTANEOUS | Status: DC
  Administered 2018-10-13 – 2018-10-14 (×2): 44 [IU] via SUBCUTANEOUS
  Filled 2018-10-13 (×2): qty 0.44

## 2018-10-13 MED ORDER — ONDANSETRON HCL 4 MG/2ML IJ SOLN
4.0000 mg | Freq: Four times a day (QID) | INTRAMUSCULAR | Status: DC | PRN
  Administered 2018-10-13 – 2018-10-16 (×5): 4 mg via INTRAVENOUS
  Filled 2018-10-13 (×6): qty 2

## 2018-10-13 MED ORDER — INSULIN ASPART 100 UNIT/ML ~~LOC~~ SOLN
0.0000 [IU] | Freq: Three times a day (TID) | SUBCUTANEOUS | Status: DC
  Administered 2018-10-14 (×2): 7 [IU] via SUBCUTANEOUS
  Administered 2018-10-15: 4 [IU] via SUBCUTANEOUS
  Administered 2018-10-15 (×2): 7 [IU] via SUBCUTANEOUS
  Administered 2018-10-16 (×3): 4 [IU] via SUBCUTANEOUS
  Administered 2018-10-17: 3 [IU] via SUBCUTANEOUS
  Administered 2018-10-17 – 2018-10-18 (×2): 4 [IU] via SUBCUTANEOUS
  Administered 2018-10-19: 7 [IU] via SUBCUTANEOUS
  Administered 2018-10-19 – 2018-10-20 (×3): 4 [IU] via SUBCUTANEOUS
  Administered 2018-10-21: 12:00:00 3 [IU] via SUBCUTANEOUS
  Administered 2018-10-22 (×2): 4 [IU] via SUBCUTANEOUS
  Administered 2018-10-22: 3 [IU] via SUBCUTANEOUS
  Administered 2018-10-23: 7 [IU] via SUBCUTANEOUS
  Administered 2018-10-23 (×2): 4 [IU] via SUBCUTANEOUS
  Administered 2018-10-24: 7 [IU] via SUBCUTANEOUS
  Administered 2018-10-24: 3 [IU] via SUBCUTANEOUS
  Administered 2018-10-24 – 2018-10-25 (×3): 4 [IU] via SUBCUTANEOUS
  Administered 2018-10-25: 3 [IU] via SUBCUTANEOUS
  Administered 2018-10-26: 4 [IU] via SUBCUTANEOUS
  Administered 2018-10-26: 7 [IU] via SUBCUTANEOUS
  Administered 2018-10-26 – 2018-10-27 (×2): 4 [IU] via SUBCUTANEOUS
  Administered 2018-10-27: 3 [IU] via SUBCUTANEOUS
  Administered 2018-10-27: 7 [IU] via SUBCUTANEOUS
  Administered 2018-10-28: 4 [IU] via SUBCUTANEOUS
  Administered 2018-10-28: 7 [IU] via SUBCUTANEOUS
  Administered 2018-10-28: 15 [IU] via SUBCUTANEOUS
  Administered 2018-10-29: 11 [IU] via SUBCUTANEOUS
  Administered 2018-10-29: 4 [IU] via SUBCUTANEOUS
  Administered 2018-10-29 – 2018-10-30 (×3): 7 [IU] via SUBCUTANEOUS

## 2018-10-13 MED ORDER — ALLOPURINOL 100 MG PO TABS
300.0000 mg | ORAL_TABLET | Freq: Every day | ORAL | Status: DC
  Administered 2018-10-13 – 2018-10-17 (×5): 300 mg via ORAL
  Filled 2018-10-13 (×5): qty 3

## 2018-10-13 MED ORDER — ALBUTEROL SULFATE HFA 108 (90 BASE) MCG/ACT IN AERS
1.0000 | INHALATION_SPRAY | RESPIRATORY_TRACT | Status: DC | PRN
  Administered 2018-10-14: 2 via RESPIRATORY_TRACT
  Filled 2018-10-13: qty 6.7

## 2018-10-13 MED ORDER — TOPIRAMATE 25 MG PO TABS
75.0000 mg | ORAL_TABLET | Freq: Every day | ORAL | Status: DC
  Administered 2018-10-13 – 2018-10-29 (×17): 75 mg via ORAL
  Filled 2018-10-13 (×19): qty 3

## 2018-10-13 MED ORDER — FUROSEMIDE 10 MG/ML IJ SOLN
60.0000 mg | Freq: Two times a day (BID) | INTRAMUSCULAR | Status: DC
  Administered 2018-10-13 – 2018-10-15 (×4): 60 mg via INTRAVENOUS
  Filled 2018-10-13 (×4): qty 6

## 2018-10-13 MED ORDER — TIZANIDINE HCL 2 MG PO TABS
4.0000 mg | ORAL_TABLET | Freq: Three times a day (TID) | ORAL | Status: DC | PRN
  Administered 2018-10-13 – 2018-10-18 (×5): 4 mg via ORAL
  Filled 2018-10-13: qty 2
  Filled 2018-10-13: qty 1
  Filled 2018-10-13 (×2): qty 2
  Filled 2018-10-13: qty 1
  Filled 2018-10-13: qty 2

## 2018-10-13 MED ORDER — INSULIN ASPART 100 UNIT/ML ~~LOC~~ SOLN
0.0000 [IU] | Freq: Every day | SUBCUTANEOUS | Status: DC
  Administered 2018-10-13 – 2018-10-15 (×3): 2 [IU] via SUBCUTANEOUS
  Administered 2018-10-16: 21:00:00 3 [IU] via SUBCUTANEOUS
  Administered 2018-10-17 – 2018-10-24 (×3): 2 [IU] via SUBCUTANEOUS
  Administered 2018-10-27: 4 [IU] via SUBCUTANEOUS
  Administered 2018-10-28: 3 [IU] via SUBCUTANEOUS
  Administered 2018-10-29: 4 [IU] via SUBCUTANEOUS

## 2018-10-13 MED ORDER — ACETAMINOPHEN 325 MG PO TABS
650.0000 mg | ORAL_TABLET | Freq: Four times a day (QID) | ORAL | Status: DC | PRN
  Administered 2018-10-15 – 2018-10-24 (×10): 650 mg via ORAL
  Filled 2018-10-13 (×10): qty 2

## 2018-10-13 MED ORDER — VITAMIN D 25 MCG (1000 UNIT) PO TABS: 5000.0000 [IU] | ORAL_TABLET | Freq: Every day | ORAL | Status: DC

## 2018-10-13 NOTE — Progress Notes (Signed)
Notifed Dr. Thereasa Solo HGB 6.9

## 2018-10-13 NOTE — Progress Notes (Signed)
[place] TEAM 1 - Stepdown/ICU TEAM  [name]  Stephanie Combs:[id number] DOB: 1951-03-31 DOA: 10/11/2018 PCP: [name], MD    Brief Narrative:  [phone] w/ a hx of chronic diastolic CHF (EF 10%), small cell lung cancer and colon cancer on chemotherapy under the care of Dr. [name], COPD on 2L  chronically, and DM who presented to the ED with a 10 pound weight gain over a week. Incidentally her SARS-CoV-2 test returned positive but a CXR was unremarkable.   Significant Events: 6/1 admit via [place] direct admit   COVID-19 specific Treatment: None indicated   Subjective: Had a fever to 101.7 early this morning. C/o ongoing severe nausea, but no vomiting. Reports aches and pains all over but no SSCP or focused abdominal pain.  Reports a poor appetite.  Assessment & Plan:  Acute on chronic diastolic heart failure dry weight appears to be 143kg - TTE 06/2018 noted EF of 60-65% - net negative ~1.5L since admit - continue to diurese towards goal weight  Filed Weights   10/12/18 0620 10/13/18 0459  Weight: (!) 150.2 kg (!) 149.1 kg    SARS-CoV-2 Positive No evidence of COVID at present - this is an incidental finding - follow closely   Intractable Nausea ?due to chemotherapy v/s COVID gastroenteritis - no diarrhea thus far - tx sx and follow clinically   Anemia of chronic disease Secondary to kidney disease + CA - recheck later today and transfuse 1U PRBC if remains < 7.0  DM CBG not at goal - adjust tx and follow  Small cell lung (LUL) - R colon cancer (hepatic flexure) Not a candidate for surgery secondary to high risk - currently undergoing chemotherapy and XRT, which are presently on hold - to f/u w/ Oncology after d/c from [facility]  CAD Asymptomatic presently  Hyperlipidemia Continue Lipitor  Hypertension Blood pressure currently well controlled  COPD - Chronic hypoxic respiratory failure Stable at this time on minimal oxygen support  Hypothyroidism Continue  Synthroid  Rheumatoid arthritis Continue usual home medications  CKD stage III Baseline creatinine 1.5 -creatinine stable presently  Depression/anxiety Continue Xanax TID prn  Morbid obesity - Body mass index is 61.1 kg/m.   DVT prophylaxis: lovenox  Code Status: DNR - NO CODE Family Communication:  Disposition Plan: Attempt to mobilize-PT/OT  Consultants:  Oncology (while at [facility])  Antimicrobials:  none   Objective: Blood pressure (!) 124/54, pulse 89, temperature 98.7 F (37.1 C), temperature source Oral, resp. rate (!) 24, height 5' 1.5" (1.562 m), weight (!) 149.1 kg, SpO2 94 %.  Intake/Output Summary (Last 24 hours) at 10/13/2018 0903 Last data filed at 10/13/2018 1751 Gross per 24 hour  Intake 1315 ml  Output 1301 ml  Net 14 ml   Filed Weights   10/12/18 0620 10/13/18 0459  Weight: (!) 150.2 kg (!) 149.1 kg    Examination: General: No acute respiratory distress Lungs: Distant breath sounds throughout, poor air movement bilateral bases, no wheezing Cardiovascular: Distant heart sounds, regular rate and rhythm without murmur gallop or rub normal S1 and S2 Abdomen: Morbidly obese, soft, bowel sounds positive, no rebound, no appreciable mass but exam is difficult Extremities: 2+ chronic appearing bilateral lower extremity edema with change of chronic venous stasis  CBC: Recent Labs  Lab 10/07/18 1050 10/11/18 0744 10/12/18 0532  WBC 17.3* 15.5* 13.2*  NEUTROABS 12.5* 13.2*  --   HGB 8.5* 8.4* 8.0*  HCT 29.5* 29.3* 27.8*  MCV 93.9 96.1 96.9  PLT 251  358 277*   Basic Metabolic Panel: Recent Labs  Lab 10/07/18 1050 10/11/18 0744 10/12/18 0532  NA 142 143 138  K 4.3 4.1 4.7  CL 104 106 106  CO2 29 27 26   GLUCOSE 137* 89 351*  BUN 22 20 22   CREATININE 1.88* 1.47* 1.36*  CALCIUM 8.7* 8.6* 8.6*   GFR: Estimated Creatinine Clearance: 55.6 mL/min (A) (by C-G formula based on SCr of 1.36 mg/dL (H)).  Liver Function Tests: Recent Labs  Lab  10/07/18 1050 10/11/18 0744  AST 14* 11*  ALT 8 8  ALKPHOS 147* 131*  BILITOT 0.3 0.3  PROT 6.7 6.4*  ALBUMIN 3.4* 3.3*    HbA1C: Hemoglobin A1C  Date/Time Value Ref Range Status  11/11/2016 7.5  Final  08/09/2016 9.6  Final   Hgb A1c MFr Bld  Date/Time Value Ref Range Status  08/30/2018 09:15 PM 7.5 (H) 4.8 - 5.6 % Final    Comment:    (NOTE)         Prediabetes: 5.7 - 6.4         Diabetes: >6.4         Glycemic control for adults with diabetes: <7.0   07/05/2018 11:38 AM 7.9 (H) 4.8 - 5.6 % Final    Comment:    (NOTE) Pre diabetes:          5.7%-6.4% Diabetes:              >6.4% Glycemic control for   <7.0% adults with diabetes     CBG: Recent Labs  Lab 10/11/18 2001 10/12/18 0807 10/12/18 1131 10/12/18 2126 10/13/18 0732  GLUCAP 367* 344* 258* 241* 259*    Recent Results (from the past 240 hour(s))  SARS Coronavirus 2 (CEPHEID- Performed in [place] hospital lab), Hosp Order     Status: Abnormal   Collection Time: 10/11/18  3:34 PM  Result Value Ref Range Status   SARS Coronavirus 2 POSITIVE (A) NEGATIVE Final    Comment: RESULT CALLED TO, READ BACK BY AND VERIFIED WITH: [place] [id number] @ 6767 BY J SCOTTON (NOTE) If result is NEGATIVE SARS-CoV-2 target nucleic acids are NOT DETECTED. The SARS-CoV-2 RNA is generally detectable in upper and lower  respiratory specimens during the acute phase of infection. The lowest  concentration of SARS-CoV-2 viral copies this assay can detect is 250  copies / mL. A negative result does not preclude SARS-CoV-2 infection  and should not be used as the sole basis for treatment or other  patient management decisions.  A negative result may occur with  improper specimen collection / handling, submission of specimen other  than nasopharyngeal swab, presence of viral mutation(s) within the  areas targeted by this assay, and inadequate number of viral copies  (<250 copies / mL). A negative result must be  combined with clinical  observations, patient history, and epidemiological information. If result is POSITIVE SARS-CoV-2 target nucleic acids are DETEC TED. The SARS-CoV-2 RNA is generally detectable in upper and lower  respiratory specimens during the acute phase of infection.  Positive  results are indicative of active infection with SARS-CoV-2.  Clinical  correlation with patient history and other diagnostic information is  necessary to determine patient infection status.  Positive results do  not rule out bacterial infection or co-infection with other viruses. If result is PRESUMPTIVE POSTIVE SARS-CoV-2 nucleic acids MAY BE PRESENT.   A presumptive positive result was obtained on the submitted specimen  and confirmed on repeat testing.  While 2019  novel coronavirus  (SARS-CoV-2) nucleic acids may be present in the submitted sample  additional confirmatory testing may be necessary for epidemiological  and / or clinical management purposes  to differentiate between  SARS-CoV-2 and other Sarbecovirus currently known to infect humans.  If clinically indicated additional testing with an alternate test  methodology (LAB7 453) is advised. The SARS-CoV-2 RNA is generally  detectable in upper and lower respiratory specimens during the acute  phase of infection. The expected result is Negative. Fact Sheet for Patients:  StrictlyIdeas.no Fact Sheet for Healthcare Providers: BankingDealers.co.za This test is not yet approved or cleared by the [place] FDA and has been authorized for detection and/or diagnosis of SARS-CoV-2 by FDA under an Emergency Use Authorization (EUA).  This EUA will remain in effect (meaning this test can be used) for the duration of the COVID-19 declaration under Section 564(b)(1) of the Act, 21 U.S.C. section 360bbb-3(b)(1), unless the authorization is terminated or revoked sooner. Performed at [facility], [place] [address], [place], [place] [address]      Scheduled Meds: . aspirin EC  81 mg Oral Daily  . atorvastatin  80 mg Oral QPM  . cholecalciferol  5,000 Units Oral Daily  . docusate sodium  300 mg Oral Daily  . furosemide  40 mg Intravenous BID  . gabapentin  600 mg Oral TID  . Gerhardt's butt cream   Topical TID  . insulin aspart  0-20 Units Subcutaneous TID WC  . insulin detemir  40 Units Subcutaneous QHS  . levothyroxine  200 mcg Oral Daily  . nystatin   Topical TID  . pantoprazole  40 mg Oral BID  . polyethylene glycol  17 g Oral BID  . potassium chloride  20 mEq Oral BID  . senna  2 tablet Oral BID WC  . umeclidinium-vilanterol  1 puff Inhalation Daily  . vitamin C  500 mg Oral BID  . zinc sulfate  220 mg Oral Daily     LOS: 2 days   Cherene Altes, MD [facility] Office  [phone] Pager - Text Page per Amion  If 7PM-7AM, please contact night-coverage per Amion 10/13/2018, 9:03 AM

## 2018-10-13 NOTE — Progress Notes (Signed)
Inpatient Diabetes Program Recommendations  AACE/ADA: New Consensus Statement on Inpatient Glycemic Control (2015)  Target Ranges:  Prepandial:   less than 140 mg/dL      Peak postprandial:   less than 180 mg/dL (1-2 hours)      Critically ill patients:  140 - 180 mg/dL   Lab Results  Component Value Date   GLUCAP 202 (H) 10/13/2018   HGBA1C 7.5 (H) 08/30/2018    Review of Glycemic Control Results for HELMI, Stephanie Combs (MRN [id number]) as of 10/13/2018 12:08  Ref. Range 10/12/2018 16:27 10/12/2018 21:26 10/13/2018 07:32 10/13/2018 10:22 10/13/2018 11:28  Glucose-Capillary Latest Ref Range: 70 - 99 mg/dL 204 (H) 241 (H) 259 (H) 204 (H) 202 (H)   Diabetes history: DM 2 Outpatient Diabetes medications: Levemir 40 units, Novolog 0-20 units tid + hs  Current orders for Inpatient glycemic control:  Levemir 40 units Novolog 0-20 units tid  Inpatient Diabetes Program Recommendations:   -Add Novolog 0-5 units hs  Thank you, Bethena Roys E. Collyn Selk, RN, MSN, CDE  Diabetes Coordinator Inpatient Glycemic Control Team Team Pager [phone] (8am-5pm) 10/13/2018 12:08 PM

## 2018-10-13 NOTE — Evaluation (Signed)
Physical Therapy Evaluation Patient Details Name: Stephanie Combs MRN: [id number] DOB: Feb 03, 1951 Today's Date: 10/13/2018   History of Present Illness  On 10/11/18, Stephanie Combs is a 68 y.o. female with medical history significant of diastolic heart failure, anxiety, COPD, diabetes mellitus, chronic respiratory failure on 2 L via nasal cannula, lung and colon cancer received chemo and radiation therapy.  Oncology referred pt for admission due to a 10 pound weight gain over the last week.  She is try to manage this with oral Lasix.  Reports some associated shortness of breath as well.  She has associated lower extremity swelling with weeping which is chronic. On admission she tested + COVID-19. per MD primarily acute on chronic CHF with COVID + an incidental finding. Transfer from [place] to [place].     Clinical Impression  Pt admitted with above diagnoses, per MD primarily acute on chronic CHF with COVID + an incidental finding on admission. She has limited mobility PTA with help from family and [facility] aide, however she is able to live alone and walk out of home, down ramp, to car with her RW. Pt currently with functional limitations due to the deficits listed below (see PT Problem List).  Pt will benefit from skilled PT to increase their independence and safety with mobility to allow discharge to the venue listed below.       Follow Up Recommendations No PT follow up;Supervision - Intermittent (anticipate return home with level of family/friend/aid assist as PTA)    Equipment Recommendations  None recommended by PT    Recommendations for Other Services OT consult     Precautions / Restrictions Precautions Precautions: Fall Restrictions Weight Bearing Restrictions: No      Mobility  Bed Mobility Overal bed mobility: Needs Assistance Bed Mobility: Supine to Sit;Sit to Supine     Supine to sit: Supervision;HOB elevated Sit to supine: Min assist   General bed mobility comments: supervsion for  coming to EoB, requiring increased time and effort. minA for lifting R LE back into bed  Transfers Overall transfer level: Needs assistance Equipment used: None Transfers: Sit to/from [facility] Sit to Stand: Min guard Stand pivot transfers: Min guard       General transfer comment: min guard for pivoting to/from [facility]  Ambulation/Gait             General Gait Details: deferred--will need bariatric RW and prefer +2 to follow with chair for safety  Stairs            Wheelchair Mobility    Modified Rankin (Stroke Patients Only)       Balance Overall balance assessment: Needs assistance Sitting-balance support: Feet supported;No upper extremity supported Sitting balance-Leahy Scale: Good     Standing balance support: During functional activity;Single extremity supported Standing balance-Leahy Scale: Poor Standing balance comment: requires single UE support for dynamic balance                             Pertinent Vitals/Pain Pain Assessment: Faces Faces Pain Scale: Hurts a little bit Pain Location: bil legs when assisted back onto bed Pain Descriptors / Indicators: Discomfort Pain Intervention(s): Limited activity within patient's tolerance;Repositioned    Home Living Family/patient expects to be discharged to:: Private residence Living Arrangements: Alone Available Help at Discharge: Personal care attendant;Family;Available PRN/intermittently Type of Home: Mobile home Home Access: Ramped entrance     Home Layout: One level Home Equipment: Walker - 2  wheels;Cane - single point;Shower seat;Bedside commode;Wheelchair - manual Additional Comments: reports she is in process of getting a rollator    Prior Function Level of Independence: Needs assistance   Gait / Transfers Assistance Needed: in mobile home she "furniture walks"; uses RW to walk down ramp to car; family or church members drives her to appointments and they buy her  groceries  ADL's / Homemaking Assistance Needed: bathes sitting on her BSC (not in shower); family/friends assist with house work; aide comes 3x/week for ~1 hour (per pt)        Hand Dominance        Extremity/Trunk Assessment   Upper Extremity Assessment Upper Extremity Assessment: Generalized weakness    Lower Extremity Assessment Lower Extremity Assessment: Generalized weakness(requires assist to raise legs onto bed sit to supine)    Cervical / Trunk Assessment Cervical / Trunk Assessment: Other exceptions Cervical / Trunk Exceptions: morbid obesity  Communication      Cognition Arousal/Alertness: Awake/alert Behavior During Therapy: WFL for tasks assessed/performed Overall Cognitive Status: Within Functional Limits for tasks assessed                                        General Comments General comments (skin integrity, edema, etc.): bil LEs with cellulitis (per MD note) erythema however not weeping    Exercises     Assessment/Plan    PT Assessment Patient needs continued PT services  PT Problem List Decreased strength;Decreased activity tolerance;Decreased balance;Decreased mobility;Decreased knowledge of use of DME;Cardiopulmonary status limiting activity;Obesity;Pain       PT Treatment Interventions Therapeutic exercise;Gait training;Stair training;Functional mobility training;Therapeutic activities;Patient/family education;DME instruction;Balance training    PT Goals (Current goals can be found in the Care Plan section)  Acute Rehab PT Goals Patient Stated Goal: return home with family and home aides to assist PT Goal Formulation: With patient Time For Goal Achievement: 10/27/18 Potential to Achieve Goals: Fair    Frequency Min 4X/week   Barriers to discharge        Co-evaluation               AM-PAC PT "6 Clicks" Mobility  Outcome Measure Help needed turning from your back to your side while in a flat bed without using  bedrails?: None Help needed moving from lying on your back to sitting on the side of a flat bed without using bedrails?: None Help needed moving to and from a bed to a chair (including a wheelchair)?: A Little Help needed standing up from a chair using your arms (e.g., wheelchair or bedside chair)?: A Little Help needed to walk in hospital room?: A Little Help needed climbing 3-5 steps with a railing? : Total 6 Click Score: 18    End of Session Equipment Utilized During Treatment: Oxygen Activity Tolerance: Patient limited by fatigue Patient left: in bed;with call bell/phone within reach Nurse Communication: Mobility status PT Visit Diagnosis: Other abnormalities of gait and mobility (R26.89);Muscle weakness (generalized) (M62.81)    Time: 4098-1191 PT Time Calculation (min) (ACUTE ONLY): 49 min   Charges:   PT Evaluation $PT Eval Moderate Complexity: 1 Mod PT Treatments $Therapeutic Activity: 8-22 mins          [employer], PT 10/13/2018, 5:10 PM

## 2018-10-13 NOTE — Progress Notes (Addendum)
SCD ordered for patient. We do not have a size large. Notified charge nurse to order correct size.  1600- wet to moist dressing change mid lower abd- intradry between abd folds-wound care consult placed at 1600, cleaned between abd folds/

## 2018-10-14 ENCOUNTER — Ambulatory Visit: Payer: Medicare Other

## 2018-10-14 LAB — FERRITIN: Ferritin: 252 ng/mL (ref 11–307)

## 2018-10-14 LAB — CBC WITH DIFFERENTIAL/PLATELET
Abs Immature Granulocytes: 0.05 10*3/uL (ref 0.00–0.07)
Basophils Absolute: 0.1 10*3/uL (ref 0.0–0.1)
Basophils Relative: 1 %
Eosinophils Absolute: 0 10*3/uL (ref 0.0–0.5)
Eosinophils Relative: 0 %
HCT: 24.6 % — ABNORMAL LOW (ref 36.0–46.0)
Hemoglobin: 7.1 g/dL — ABNORMAL LOW (ref 12.0–15.0)
Immature Granulocytes: 1 %
Lymphocytes Relative: 21 %
Lymphs Abs: 0.8 10*3/uL (ref 0.7–4.0)
MCH: 28.2 pg (ref 26.0–34.0)
MCHC: 28.9 g/dL — ABNORMAL LOW (ref 30.0–36.0)
MCV: 97.6 fL (ref 80.0–100.0)
Monocytes Absolute: 0.4 10*3/uL (ref 0.1–1.0)
Monocytes Relative: 11 %
Neutro Abs: 2.6 10*3/uL (ref 1.7–7.7)
Neutrophils Relative %: 66 %
Platelets: 362 10*3/uL (ref 150–400)
RBC: 2.52 MIL/uL — ABNORMAL LOW (ref 3.87–5.11)
RDW: 24.6 % — ABNORMAL HIGH (ref 11.5–15.5)
WBC: 4 10*3/uL (ref 4.0–10.5)
nRBC: 0 % (ref 0.0–0.2)

## 2018-10-14 LAB — COMPREHENSIVE METABOLIC PANEL
ALT: 11 U/L (ref 0–44)
AST: 13 U/L — ABNORMAL LOW (ref 15–41)
Albumin: 3 g/dL — ABNORMAL LOW (ref 3.5–5.0)
Alkaline Phosphatase: 92 U/L (ref 38–126)
Anion gap: 8 (ref 5–15)
BUN: 29 mg/dL — ABNORMAL HIGH (ref 8–23)
CO2: 30 mmol/L (ref 22–32)
Calcium: 8.3 mg/dL — ABNORMAL LOW (ref 8.9–10.3)
Chloride: 102 mmol/L (ref 98–111)
Creatinine, Ser: 1.3 mg/dL — ABNORMAL HIGH (ref 0.44–1.00)
GFR calc Af Amer: 49 mL/min — ABNORMAL LOW (ref >60)
GFR calc non Af Amer: 42 mL/min — ABNORMAL LOW (ref >60)
Glucose, Bld: 160 mg/dL — ABNORMAL HIGH (ref 70–99)
Potassium: 4.3 mmol/L (ref 3.5–5.1)
Sodium: 140 mmol/L (ref 135–145)
Total Bilirubin: 0.3 mg/dL (ref 0.3–1.2)
Total Protein: 5.7 g/dL — ABNORMAL LOW (ref 6.5–8.1)

## 2018-10-14 LAB — GLUCOSE, CAPILLARY
Glucose-Capillary: 124 mg/dL — ABNORMAL HIGH (ref 70–99)
Glucose-Capillary: 248 mg/dL — ABNORMAL HIGH (ref 70–99)
Glucose-Capillary: 216 mg/dL — ABNORMAL HIGH (ref 70–99)
Glucose-Capillary: 213 mg/dL — ABNORMAL HIGH (ref 70–99)

## 2018-10-14 LAB — FUNGAL ORGANISM REFLEX

## 2018-10-14 LAB — FUNGUS CULTURE WITH STAIN

## 2018-10-14 LAB — FUNGUS CULTURE RESULT

## 2018-10-14 LAB — C-REACTIVE PROTEIN: CRP: <0.8 mg/dL (ref <1.0)

## 2018-10-14 LAB — D-DIMER, QUANTITATIVE: D-Dimer, Quant: 0.59 ug/mL-FEU — ABNORMAL HIGH (ref 0.00–0.50)

## 2018-10-14 LAB — LACTATE DEHYDROGENASE: LDH: 175 U/L (ref 98–192)

## 2018-10-14 NOTE — Progress Notes (Signed)
PT Cancellation Note  Patient Details Name: WM FRUCHTER MRN: [id number] DOB: November 24, 1950   Cancelled Treatment:    Reason Eval/Treat Not Completed: Pain limiting ability to participate;Medical issues which prohibited therapy(nausea, chills)  RN made aware pt requesting pain medicine for headache. She cannot have pain medicine again until 13:00. Will attempt to see this pm after pain meds (as schedule permits).  Jeanie Cooks Alegandro Macnaughton, PT 10/14/2018, 11:03 AM

## 2018-10-14 NOTE — Progress Notes (Signed)
Daughter called and updated about patient. All questions was answered. She also talked with the patient for a few minutes.

## 2018-10-14 NOTE — Progress Notes (Signed)
Occupational Therapy Evaluation Patient Details Name: Stephanie Combs MRN: [id number] DOB: 10/11/1950 Today's Date: 10/14/2018    History of Present Illness On 10/11/18, Stephanie Combs is a 68 y.o. female with medical history significant of diastolic heart failure, anxiety, COPD, diabetes mellitus, chronic respiratory failure on 2 L via nasal cannula, lung and colon cancer received chemo and radiation therapy.  Oncology referred pt for admission due to a 10 pound weight gain over the last week.  She is try to manage this with oral Lasix.  Reports some associated shortness of breath as well.  She has associated lower extremity swelling with weeping which is chronic. On admission she tested + COVID-19. per MD primarily acute on chronic CHF with COVID + an incidental finding. Transfer from [place] to [place].    Clinical Impression   PTA, pt lived at home and had assistance form family/PCA as needed for ADL. Pt with decline in function and will benefit from acute OT. Pt limited today by pain. Discussed pain managemnt with nsg given pt's cancer. Will need to address pain in order to progress with OT and PT. Nsg stated she would contact her MD. Will follow acutely. Recommend HHOT at follow up.    Follow Up Recommendations  (P) Home health OT;Supervision/Assistance - 24 hour    Equipment Recommendations  (P) None recommended by OT    Recommendations for Other Services       Precautions / Restrictions Precautions Precautions: Fall Restrictions Weight Bearing Restrictions: No      Mobility Bed Mobility Overal bed mobility: Needs Assistance Bed Mobility: Supine to Sit;Sit to Supine     Supine to sit: Supervision;HOB elevated Sit to supine: Min assist   General bed mobility comments: Per PT note  Transfers                 General transfer comment: pt declined    Balance                                           ADL either performed or assessed with clinical  judgement   ADL Overall ADL's : Needs assistance/impaired Eating/Feeding: Set up Eating/Feeding Details (indicate cue type and reason): difficulty opening packagres Grooming: Minimal assistance;Sitting;Bed level   Upper Body Bathing: Minimal assistance;Bed level   Lower Body Bathing: Maximal assistance;Bed level   Upper Body Dressing : Moderate assistance;Bed level   Lower Body Dressing: Maximal assistance;Bed level   Toilet Transfer: Minimal assistance Toilet Transfer Details (indicate cue type and reason): per nsg     Tub/ Shower Transfer: Moderate assistance Tub/Shower Transfer Details (indicate cue type and reason): per nsg; may benefit from toilet tong   General ADL Comments: States she is able to do these tasks, then says there is not way she could do them now; will most likely benefit form AE     Vision Baseline Vision/History: Wears glasses Wears Glasses: Reading only       Perception     Praxis      Pertinent Vitals/Pain Pain Assessment: Faces Faces Pain Scale: Hurts even more Pain Location: :everywhere" Pain Descriptors / Indicators: Discomfort;Grimacing;Aching Pain Intervention(s): Limited activity within patient's tolerance;Patient requesting pain meds-RN notified     Hand Dominance Right(also uses L due to R shoulder problems)   Extremity/Trunk Assessment Upper Extremity Assessment Upper Extremity Assessment: RUE deficits/detail;LUE deficits/detail;Generalized weakness RUE Deficits / Details: frozen shoulder -  nonfunctional shoulder; elbow/wrist/hand ROM WFL; weak grip but funcitonal  LUE Deficits / Details: generalized weakness but functional states sheis unable to operate TV controls due to her weakness   Lower Extremity Assessment Lower Extremity Assessment: Defer to PT evaluation   Cervical / Trunk Assessment Cervical / Trunk Assessment: Other exceptions Cervical / Trunk Exceptions: morbid obesity   Communication Communication Communication:  No difficulties   Cognition Arousal/Alertness: Awake/alert Behavior During Therapy: WFL for tasks assessed/performed Overall Cognitive Status: Within Functional Limits for tasks assessed                                 General Comments: appears frustrated   General Comments       Exercises     Shoulder Instructions      Home Living Family/patient expects to be discharged to:: Private residence Living Arrangements: Alone Available Help at Discharge: Personal care attendant;Family;Available PRN/intermittently Type of Home: Mobile home Home Access: Ramped entrance     Home Layout: One level     Bathroom Shower/Tub: Teacher, early years/pre: Handicapped height     Home Equipment: Environmental consultant - 2 wheels;Cane - single point;Shower seat;Bedside commode;Wheelchair - manual   Additional Comments: reports she is in process of getting a rollator      Prior Functioning/Environment Level of Independence: Needs assistance  Gait / Transfers Assistance Needed: in mobile home she "furniture walks"; uses RW to walk down ramp to car; family or church members drives her to appointments and they buy her groceries ADL's / [place] Needed: bathes sitting on her BSC (not in shower); family/friends assist with house work; aide comes 3x/week for ~1 hour (per pt) Communication / Swallowing Assistance Needed: no deficits          OT Problem List: Decreased strength;Decreased range of motion;Decreased activity tolerance;Impaired balance (sitting and/or standing);Decreased safety awareness;Decreased knowledge of use of DME or AE;Cardiopulmonary status limiting activity;Obesity;Pain      OT Treatment/Interventions: (P) Self-care/ADL training;Therapeutic exercise;Energy conservation;DME and/or AE instruction;Therapeutic activities;Patient/family education;Balance training    OT Goals(Current goals can be found in the care plan section) Acute Rehab OT Goals Patient  Stated Goal: return home with family and home aides to assist OT Goal Formulation: (P) With patient Time For Goal Achievement: (P) 10/28/18 Potential to Achieve Goals: (P) Good  OT Frequency: (P) Min 3X/week   Barriers to D/C:            Co-evaluation              AM-PAC OT "6 Clicks" Daily Activity     Outcome Measure Help from another person eating meals?: (P) A Little Help from another person taking care of personal grooming?: (P) A Little Help from another person toileting, which includes using toliet, bedpan, or urinal?: (P) A Lot Help from another person bathing (including washing, rinsing, drying)?: (P) A Lot Help from another person to put on and taking off regular upper body clothing?: (P) A Lot Help from another person to put on and taking off regular lower body clothing?: (P) A Lot 6 Click Score: (P) 14   End of Session Nurse Communication: Patient requests pain meds;Other (comment)(discuss pain management with MD)  Activity Tolerance: Patient limited by pain Patient left: in bed;with call bell/phone within reach  OT Visit Diagnosis: Unsteadiness on feet (R26.81);Other abnormalities of gait and mobility (R26.89);Pain;Muscle weakness (generalized) (M62.81) Pain - part of body: (general discomfort; back)  Time: (P) 1028-(P) 1049 OT Time Calculation (min): (P) 21 min Charges:  OT General Charges $OT Visit: (P) 1 Visit OT Evaluation $OT Eval Moderate Complexity: (P) [place], OT/L   Acute OT Clinical Specialist Acute Rehabilitation Services Pager [phone] Office [phone]   [facility] 10/14/2018, 2:52 PM

## 2018-10-14 NOTE — Plan of Care (Signed)

## 2018-10-14 NOTE — Progress Notes (Signed)
Physical Therapy Treatment Patient Details Name: Stephanie Combs MRN: [id number] DOB: Jan 09, 1951 Today's Date: 10/14/2018    History of Present Illness On 10/11/18, Stephanie Combs is a 68 y.o. female with medical history significant of diastolic heart failure, anxiety, COPD, diabetes mellitus, chronic respiratory failure on 2 L via nasal cannula, lung and colon cancer received chemo and radiation therapy.  Oncology referred pt for admission due to a 10 pound weight gain over the last week.  She is try to manage this with oral Lasix.  Reports some associated shortness of breath as well.  She has associated lower extremity swelling with weeping which is chronic. On admission she tested + COVID-19. per MD primarily acute on chronic CHF with COVID + an incidental finding. Transfer from [place] to [place].     PT Comments    Pt completed stand pivot transfers and took a few steps with maximum encouragement and supervision for safety, VSS. She is limited mainly by her own lack of motivation or willingness to mobilize. Continue PT POC  Follow Up Recommendations  No PT follow up;Supervision - Intermittent     Equipment Recommendations  None recommended by PT(pt states she is getting a rollator)    Recommendations for Other Services       Precautions / Restrictions Precautions Precautions: Fall Restrictions Weight Bearing Restrictions: No    Mobility  Bed Mobility Overal bed mobility: Modified Independent Bed Mobility: Supine to Sit     Supine to sit: Modified independent (Device/Increase time) Sit to supine: Min assist   General bed mobility comments: pt uses rail, no physical assist needed  Transfers Overall transfer level: Needs assistance Equipment used: None Transfers: Sit to/from [facility] Sit to Stand: Supervision Stand pivot transfers: Supervision       General transfer comment: pt refused RW for stand pivot to [facility], no physical assist, line management needed.     Ambulation/Gait Ambulation/Gait assistance: Min guard;Supervision Gait Distance (Feet): 4 Feet Assistive device: Rolling walker (2 wheeled) Gait Pattern/deviations: Step-to pattern;Trunk flexed;Wide base of support     General Gait Details: with maximum encouragement pt took a few steps from [employer] to recliner. supervision for safety and line management, no LOB, pt refused greater distance; VSS   Stairs             Wheelchair Mobility    Modified Rankin (Stroke Patients Only)       Balance   Sitting-balance support: Feet supported;No upper extremity supported Sitting balance-Leahy Scale: Good     Standing balance support: During functional activity;Single extremity supported Standing balance-Leahy Scale: Fair Standing balance comment: pt able to static stand without UE support                            Cognition Arousal/Alertness: Awake/alert Behavior During Therapy: WFL for tasks assessed/performed Overall Cognitive Status: Within Functional Limits for tasks assessed                                 General Comments: pt with general c/o about multiple things -- [employer] staff, TV channels, back pain etc. pt is not motivated to mobilize       Exercises General Exercises - Lower Extremity Ankle Circles/Pumps: AROM;Both;20 reps Long Arc Quad: AROM;Both;10 reps Heel Raises: AROM;Both;10 reps    General Comments        Pertinent Vitals/Pain Pain Assessment: Faces  Faces Pain Scale: Hurts little more Pain Location: back Pain Descriptors / Indicators: Discomfort;Grimacing;Aching Pain Intervention(s): Limited activity within patient's tolerance;Monitored during session;Premedicated before session;Repositioned    Home Living Family/patient expects to be discharged to:: Private residence Living Arrangements: Alone Available Help at Discharge: Personal care attendant;Family;Available PRN/intermittently Type of Home: Mobile home Home  Access: Ramped entrance   Home Layout: One level Home Equipment: [place] - 2 wheels;Cane - single point;Shower seat;Bedside commode;Wheelchair - manual Additional Comments: reports she is in process of getting a rollator    Prior Function Level of Independence: Needs assistance  Gait / Transfers Assistance Needed: in mobile home she "furniture walks"; uses RW to walk down ramp to car; family or church members drives her to appointments and they buy her groceries ADL's / Homemaking Assistance Needed: bathes sitting on her BSC (not in shower); family/friends assist with house work; aide comes 3x/week for ~1 hour (per pt)     PT Goals (current goals can now be found in the care plan section) Acute Rehab PT Goals Patient Stated Goal: return home with family and home aides to assist PT Goal Formulation: With patient Time For Goal Achievement: 10/27/18 Potential to Achieve Goals: Fair Progress towards PT goals: Progressing toward goals    Frequency    Min 4X/week      PT Plan Current plan remains appropriate    Co-evaluation              AM-PAC PT "6 Clicks" Mobility   Outcome Measure  Help needed turning from your back to your side while in a flat bed without using bedrails?: None Help needed moving from lying on your back to sitting on the side of a flat bed without using bedrails?: None Help needed moving to and from a bed to a chair (including a wheelchair)?: None Help needed standing up from a chair using your arms (e.g., wheelchair or bedside chair)?: A Little Help needed to walk in hospital room?: A Little Help needed climbing 3-5 steps with a railing? : A Lot 6 Click Score: 20    End of Session Equipment Utilized During Treatment: Oxygen Activity Tolerance: Other (comment)(self limiting ) Patient left: in chair;with call bell/phone within reach Nurse Communication: Mobility status PT Visit Diagnosis: Other abnormalities of gait and mobility (R26.89);Muscle  weakness (generalized) (M62.81)     Time: 4403-4742 PT Time Calculation (min) (ACUTE ONLY): 29 min  Charges:  $Therapeutic Activity: 23-37 mins                     Kenyon Ana, PT  Pager: [phone] Acute Rehab Dept [facility]): [phone]   10/14/2018    [facility] 10/14/2018, 3:10 PM

## 2018-10-14 NOTE — Progress Notes (Addendum)
[place] TEAM 1 - Stepdown/ICU TEAM  WILLISTINE FERRALL  MHD:[id number] DOB: 1950-07-04 DOA: 10/11/2018 PCP: [name], MD    Brief Narrative:  [phone] w/ a hx of chronic diastolic CHF (EF 41%), small cell lung cancer and colon cancer on chemotherapy under the care of Dr. [name], COPD on 2L [place] chronically, and DM who presented to the [facility] with a 10 pound weight gain over a week. Incidentally her SARS-CoV-2 test returned positive but a CXR was unremarkable.   Significant Events: 6/1 admit via [place] direct admit   COVID-19 specific Treatment: None indicated   Subjective: No recurrence of fever since yesterday. Sats remain stable.  The patient reports that she feels bad in general.  She complains primarily of nausea.  She relates ongoing very poor oral intake.  Assessment & Plan:  Acute on chronic diastolic heart failure dry weight appears to be 143kg - TTE 06/2018 noted EF of 60-65% - net negative ~4.5L since admit - continue to diurese towards goal weight  Filed Weights   10/12/18 0620 10/13/18 0459 10/14/18 0219  Weight: (!) 150.2 kg (!) 149.1 kg (!) 151.7 kg    SARS-CoV-2 Positive No evidence of COVID at present - this is an incidental finding - follow closely   Recent Labs    10/12/18 1220 10/13/18 0538 10/14/18 0410  DDIMER 0.64* 0.63* 0.59*  FERRITIN 177 201 252  LDH 180 174 175  CRP <0.8 <0.8 <0.8   Intractable Nausea ?due to chemotherapy v/s COVID gastroenteritis - cont to tx sx and follow clinically   Anemia of chronic disease Secondary to kidney disease + CA - Hgb stable - follow as pt is near threshold for transfusion   DM CBG better controlled - no change in tx today   Small cell lung (LUL) - R colon cancer (hepatic flexure) Not a candidate for surgery secondary to high risk - currently undergoing chemotherapy and XRT, which are presently on hold - to f/u w/ Oncology after d/c from [name]  CAD Asymptomatic presently   Hyperlipidemia Continue Lipitor  Hypertension Blood pressure currently well controlled  COPD - Chronic hypoxic respiratory failure Stable at this time on minimal oxygen support  Hypothyroidism Continue Synthroid  Rheumatoid arthritis Continue usual home medications  CKD stage III Baseline creatinine 1.5 - creatinine stable   Depression/anxiety Continue Xanax TID prn  Morbid obesity - Body mass index is 62.17 kg/m.   DVT prophylaxis: lovenox  Code Status: DNR - NO CODE Family Communication:  Disposition Plan: Attempt to mobilize - PT/OT  Consultants:  Oncology (while at [facility])  Antimicrobials:  none   Objective: Blood pressure (!) 135/50, pulse 85, temperature 100 F (37.8 C), resp. rate 16, height 5' 1.5" (1.562 m), weight (!) 151.7 kg, SpO2 97 %.  Intake/Output Summary (Last 24 hours) at 10/14/2018 0946 Last data filed at 10/14/2018 0533 Gross per 24 hour  Intake 1240 ml  Output 4200 ml  Net -2960 ml   Filed Weights   10/12/18 0620 10/13/18 0459 10/14/18 0219  Weight: (!) 150.2 kg (!) 149.1 kg (!) 151.7 kg    Examination: General: No acute respiratory distress - somnolent -appears unwell Lungs: Distant breath sounds throughout without focal crackles Cardiovascular: Distant heart sounds, RRR without murmur Abdomen: Morbidly obese, soft, bowel sounds positive, no rebound, no appreciable mass but exam is difficult Extremities: 2+ chronic appearing bilateral lower extremity edema without significant change  CBC: Recent Labs  Lab 10/11/18 0744  10/13/18 0538 10/13/18  1455 10/14/18 0410  WBC 15.5*   < > 7.3 6.7 4.0  NEUTROABS 13.2*  --  6.1  --  2.6  HGB 8.4*   < > 6.9* 8.2* 7.1*  HCT 29.3*   < > 23.8* 27.6* 24.6*  MCV 96.1   < > 96.7 96.8 97.6  PLT 358   < > 365 403* 362   < > = values in this interval not displayed.   Basic Metabolic Panel: Recent Labs  Lab 10/12/18 0532 10/13/18 0538 10/14/18 0410  NA 138 138 140  K 4.7 5.2* 4.3  CL 106  105 102  CO2 26 28 30   GLUCOSE 351* 298* 160*  BUN 22 29* 29*  CREATININE 1.36* 1.37* 1.30*  CALCIUM 8.6* 8.4* 8.3*  MG  --  1.9  --    GFR: Estimated Creatinine Clearance: 58.9 mL/min (A) (by C-G formula based on SCr of 1.3 mg/dL (H)).  Liver Function Tests: Recent Labs  Lab 10/07/18 1050 10/11/18 0744 10/13/18 0538 10/14/18 0410  AST 14* 11* 12* 13*  ALT 8 8 10 11   ALKPHOS 147* 131* 93 92  BILITOT 0.3 0.3 0.3 0.3  PROT 6.7 6.4* 5.8* 5.7*  ALBUMIN 3.4* 3.3* 3.1* 3.0*    HbA1C: Hemoglobin A1C  Date/Time Value Ref Range Status  11/11/2016 7.5  Final  08/09/2016 9.6  Final   Hgb A1c MFr Bld  Date/Time Value Ref Range Status  08/30/2018 09:15 PM 7.5 (H) 4.8 - 5.6 % Final    Comment:    (NOTE)         Prediabetes: 5.7 - 6.4         Diabetes: >6.4         Glycemic control for adults with diabetes: <7.0   07/05/2018 11:38 AM 7.9 (H) 4.8 - 5.6 % Final    Comment:    (NOTE) Pre diabetes:          5.7%-6.4% Diabetes:              >6.4% Glycemic control for   <7.0% adults with diabetes     CBG: Recent Labs  Lab 10/13/18 1022 10/13/18 1128 10/13/18 1602 10/13/18 2035 10/14/18 0759  GLUCAP 204* 202* 119* 207* 124*    Recent Results (from the past 240 hour(s))  SARS Coronavirus 2 (CEPHEID- Performed in [place] hospital lab), Hosp Order     Status: Abnormal   Collection Time: 10/11/18  3:34 PM  Result Value Ref Range Status   SARS Coronavirus 2 POSITIVE (A) NEGATIVE Final    Comment: RESULT CALLED TO, READ BACK BY AND VERIFIED WITH: [place] [id number] @ 2423 BY J SCOTTON (NOTE) If result is NEGATIVE SARS-CoV-2 target nucleic acids are NOT DETECTED. The SARS-CoV-2 RNA is generally detectable in upper and lower  respiratory specimens during the acute phase of infection. The lowest  concentration of SARS-CoV-2 viral copies this assay can detect is 250  copies / mL. A negative result does not preclude SARS-CoV-2 infection  and should not be used as  the sole basis for treatment or other  patient management decisions.  A negative result may occur with  improper specimen collection / handling, submission of specimen other  than nasopharyngeal swab, presence of viral mutation(s) within the  areas targeted by this assay, and inadequate number of viral copies  (<250 copies / mL). A negative result must be combined with clinical  observations, patient history, and epidemiological information. If result is POSITIVE SARS-CoV-2 target nucleic acids are  DETEC TED. The SARS-CoV-2 RNA is generally detectable in upper and lower  respiratory specimens during the acute phase of infection.  Positive  results are indicative of active infection with SARS-CoV-2.  Clinical  correlation with patient history and other diagnostic information is  necessary to determine patient infection status.  Positive results do  not rule out bacterial infection or co-infection with other viruses. If result is PRESUMPTIVE POSTIVE SARS-CoV-2 nucleic acids MAY BE PRESENT.   A presumptive positive result was obtained on the submitted specimen  and confirmed on repeat testing.  While 2019 novel coronavirus  (SARS-CoV-2) nucleic acids may be present in the submitted sample  additional confirmatory testing may be necessary for epidemiological  and / or clinical management purposes  to differentiate between  SARS-CoV-2 and other Sarbecovirus currently known to infect humans.  If clinically indicated additional testing with an alternate test  methodology (LAB7 453) is advised. The SARS-CoV-2 RNA is generally  detectable in upper and lower respiratory specimens during the acute  phase of infection. The expected result is Negative. Fact Sheet for Patients:  StrictlyIdeas.no Fact Sheet for Healthcare Providers: BankingDealers.co.za This test is not yet approved or cleared by the [place] FDA and has been authorized for  detection and/or diagnosis of SARS-CoV-2 by FDA under an Emergency Use Authorization (EUA).  This EUA will remain in effect (meaning this test can be used) for the duration of the COVID-19 declaration under Section 564(b)(1) of the Act, 21 U.S.C. section 360bbb-3(b)(1), unless the authorization is terminated or revoked sooner. Performed at [facility], [place] [address], [place], [place] [address]      Scheduled Meds: . allopurinol  300 mg Oral Daily  . aspirin EC  81 mg Oral Daily  . atorvastatin  80 mg Oral QPM  . cholecalciferol  5,000 Units Oral Daily  . docusate sodium  300 mg Oral Daily  . furosemide  60 mg Intravenous BID  . gabapentin  600 mg Oral TID  . Gerhardt's butt cream   Topical TID  . insulin aspart  0-20 Units Subcutaneous TID WC  . insulin aspart  0-5 Units Subcutaneous QHS  . insulin detemir  44 Units Subcutaneous QHS  . levothyroxine  200 mcg Oral Daily  . nystatin   Topical TID  . pantoprazole  40 mg Oral BID  . polyethylene glycol  17 g Oral BID  . senna  2 tablet Oral BID WC  . topiramate  75 mg Oral QHS  . umeclidinium-vilanterol  1 puff Inhalation Daily  . vitamin C  500 mg Oral BID  . zinc sulfate  220 mg Oral Daily     LOS: 3 days   Cherene Altes, MD [facility] Office  [phone] Pager - Text Page per Amion  If 7PM-7AM, please contact night-coverage per Amion 10/14/2018, 9:46 AM

## 2018-10-14 NOTE — Consult Note (Signed)
[place] Nurse wound consult note Patient receiving care in CGV 9103.  Patient is COVID 19 positive. Consultation completed remotely via a conversation with primary RN, Cia.  Camera capability not available for this room. Reason for Consult: Abdominal wound in abdominal pannus Wound type: MASD fissure Measurement: 3 cm x 8 cm x 0.5 cm Wound bed: pink Drainage (amount, consistency, odor) area is impacted by MASD intertriginous dermatitis and this represents a fissure from moisture exposure Periwound: wet and has fungal involvement. Dressing procedure/placement/frequency: Place a piece of AquaCel Ag+ into the wound. Cover with Phil Dopp Kellie Simmering [phone]). Change the AquaCel Ag+ daily Kellie Simmering [phone]) Monitor the wound area(s) for worsening of condition such as: Signs/symptoms of infection,  Increase in size,  Development of or worsening of odor, Development of pain, or increased pain at the affected locations.  Notify the medical team if any of these develop.  Thank you for the consult.  Discussed plan of care with the bedside nurse.  [place] nurse will not follow at this time.  Please re-consult the [place] team if needed.  Val Riles, RN, MSN, CWOCN, CNS-BC, pager [phone]

## 2018-10-15 ENCOUNTER — Inpatient Hospital Stay: Payer: Medicare Other

## 2018-10-15 ENCOUNTER — Ambulatory Visit: Payer: Medicare Other

## 2018-10-15 LAB — COMPREHENSIVE METABOLIC PANEL
ALT: 10 U/L (ref 0–44)
AST: 14 U/L — ABNORMAL LOW (ref 15–41)
Albumin: 2.9 g/dL — ABNORMAL LOW (ref 3.5–5.0)
Alkaline Phosphatase: 76 U/L (ref 38–126)
Anion gap: 6 (ref 5–15)
BUN: 32 mg/dL — ABNORMAL HIGH (ref 8–23)
CO2: 31 mmol/L (ref 22–32)
Calcium: 7.7 mg/dL — ABNORMAL LOW (ref 8.9–10.3)
Chloride: 100 mmol/L (ref 98–111)
Creatinine, Ser: 1.27 mg/dL — ABNORMAL HIGH (ref 0.44–1.00)
GFR calc Af Amer: 50 mL/min — ABNORMAL LOW (ref >60)
GFR calc non Af Amer: 43 mL/min — ABNORMAL LOW (ref >60)
Glucose, Bld: 181 mg/dL — ABNORMAL HIGH (ref 70–99)
Potassium: 3.8 mmol/L (ref 3.5–5.1)
Sodium: 137 mmol/L (ref 135–145)
Total Bilirubin: 0.4 mg/dL (ref 0.3–1.2)
Total Protein: 5.4 g/dL — ABNORMAL LOW (ref 6.5–8.1)

## 2018-10-15 LAB — LACTATE DEHYDROGENASE: LDH: 189 U/L (ref 98–192)

## 2018-10-15 LAB — CBC WITH DIFFERENTIAL/PLATELET
Abs Immature Granulocytes: 0.03 10*3/uL (ref 0.00–0.07)
Basophils Absolute: 0 10*3/uL (ref 0.0–0.1)
Basophils Relative: 1 %
Eosinophils Absolute: 0 10*3/uL (ref 0.0–0.5)
Eosinophils Relative: 0 %
HCT: 22.3 % — ABNORMAL LOW (ref 36.0–46.0)
Hemoglobin: 6.8 g/dL — CL (ref 12.0–15.0)
Immature Granulocytes: 1 %
Lymphocytes Relative: 29 %
Lymphs Abs: 1 10*3/uL (ref 0.7–4.0)
MCH: 28.8 pg (ref 26.0–34.0)
MCHC: 30.5 g/dL (ref 30.0–36.0)
MCV: 94.5 fL (ref 80.0–100.0)
Monocytes Absolute: 0.2 10*3/uL (ref 0.1–1.0)
Monocytes Relative: 6 %
Neutro Abs: 2.2 10*3/uL (ref 1.7–7.7)
Neutrophils Relative %: 63 %
Platelets: 318 10*3/uL (ref 150–400)
RBC: 2.36 MIL/uL — ABNORMAL LOW (ref 3.87–5.11)
RDW: 24.2 % — ABNORMAL HIGH (ref 11.5–15.5)
WBC: 3.4 10*3/uL — ABNORMAL LOW (ref 4.0–10.5)
nRBC: 0 % (ref 0.0–0.2)

## 2018-10-15 LAB — GLUCOSE, CAPILLARY
Glucose-Capillary: 182 mg/dL — ABNORMAL HIGH (ref 70–99)
Glucose-Capillary: 246 mg/dL — ABNORMAL HIGH (ref 70–99)
Glucose-Capillary: 225 mg/dL — ABNORMAL HIGH (ref 70–99)
Glucose-Capillary: 217 mg/dL — ABNORMAL HIGH (ref 70–99)

## 2018-10-15 LAB — D-DIMER, QUANTITATIVE: D-Dimer, Quant: 0.78 ug/mL-FEU — ABNORMAL HIGH (ref 0.00–0.50)

## 2018-10-15 LAB — FERRITIN: Ferritin: 389 ng/mL — ABNORMAL HIGH (ref 11–307)

## 2018-10-15 LAB — C-REACTIVE PROTEIN: CRP: 0.9 mg/dL (ref <1.0)

## 2018-10-15 LAB — PREPARE RBC (CROSSMATCH)

## 2018-10-15 MED ORDER — FUROSEMIDE 10 MG/ML IJ SOLN
80.0000 mg | Freq: Two times a day (BID) | INTRAMUSCULAR | Status: DC
  Administered 2018-10-15 – 2018-10-17 (×5): 80 mg via INTRAVENOUS
  Filled 2018-10-15 (×5): qty 8

## 2018-10-15 MED ORDER — INSULIN DETEMIR 100 UNIT/ML ~~LOC~~ SOLN
48.0000 [IU] | Freq: Every day | SUBCUTANEOUS | Status: DC
  Administered 2018-10-15 – 2018-10-20 (×6): 48 [IU] via SUBCUTANEOUS
  Filled 2018-10-15 (×6): qty 0.48

## 2018-10-15 MED ORDER — SODIUM CHLORIDE 0.9% IV SOLUTION: Freq: Once | INTRAVENOUS | Status: DC

## 2018-10-15 NOTE — Progress Notes (Signed)
Occupational Therapy Treatment Patient Details Name: Stephanie Combs MRN: [id number] DOB: 08/11/1950 Today's Date: 10/15/2018    History of present illness On 10/11/18, Stephanie Combs is a 68 y.o. female with medical history significant of diastolic heart failure, anxiety, COPD, diabetes mellitus, chronic respiratory failure on 2 L via nasal cannula, lung and colon cancer received chemo and radiation therapy.  Oncology referred pt for admission due to a 10 pound weight gain over the last week.  She is try to manage this with oral Lasix.  Reports some associated shortness of breath as well.  She has associated lower extremity swelling with weeping which is chronic. On admission she tested + COVID-19. per MD primarily acute on chronic CHF with COVID + an incidental finding. Transfer from [place] to [place].    OT comments  Pt progressing towards OT goals, requires encouragement to participate in therapy session today. Pt initially only agreeable to bed level activity. Completed grooming ADL with setup assist and verbally reviewed use of AE for LB and toileting ADL. Pt also completing bed level UB/LB exercise. Assisted pt with toileting; pt completing stand pivot transfer to/from [facility] with minguard assist; requires modA for toileting as she cannot perform her own pericare at this time. VSS on supplemental O2 throughout. Feel POC remains appropriate. Will continue to follow.   Follow Up Recommendations  Home health OT;Supervision/Assistance - 24 hour    Equipment Recommendations  None recommended by OT          Precautions / Restrictions Precautions Precautions: Fall Restrictions Weight Bearing Restrictions: No       Mobility Bed Mobility Overal bed mobility: Needs Assistance Bed Mobility: Supine to Sit;Sit to Supine     Supine to sit: Supervision Sit to supine: Min assist   General bed mobility comments: use of rail, increased time/effort; assist for RLE when returning to  bed  Transfers Overall transfer level: Needs assistance Equipment used: None Transfers: Sit to/from [facility] Sit to Stand: Min guard Stand pivot transfers: Min guard       General transfer comment: for general safety    Balance   Sitting-balance support: Feet supported;No upper extremity supported Sitting balance-Leahy Scale: Good     Standing balance support: During functional activity;Single extremity supported Standing balance-Leahy Scale: Fair                             ADL either performed or assessed with clinical judgement   ADL Overall ADL's : Needs assistance/impaired     Grooming: Wash/dry face;Oral care;Set up;Bed level                 Lower Body Dressing Details (indicate cue type and reason): verbally reviewed use of reacher for LB dressing; will benefit from continued practice Toilet Transfer: Min guard;BSC;Stand-pivot;Requires wide/bariatric   Toileting- Clothing Manipulation and Hygiene: Moderate assistance;Sit to/from stand Toileting - Clothing Manipulation Details (indicate cue type and reason): assist for peri-care; pt wouldnt perform on her own/unable; educated pt in use of toilet aide for peri-care - pt interested in using      Functional mobility during ADLs: Min guard(stand pivot)       Vision       Perception     Praxis      Cognition Arousal/Alertness: Awake/alert Behavior During Therapy: WFL for tasks assessed/performed Overall Cognitive Status: Within Functional Limits for tasks assessed  General Comments: requires encouragement        Exercises Exercises: General Upper Extremity;General Lower Extremity General Exercises - Upper Extremity Shoulder Flexion: AROM;Left;10 reps Elbow Flexion: AROM;Both;10 reps Elbow Extension: AROM;Both;10 reps General Exercises - Lower Extremity Ankle Circles/Pumps: AROM;Both;10 reps Straight Leg Raises:  AROM;Both;20 reps   Shoulder Instructions       General Comments VSS pt on supplemental O2    Pertinent Vitals/ Pain       Pain Assessment: Faces Faces Pain Scale: No hurt Pain Intervention(s): Monitored during session  Home Living                                          Prior Functioning/Environment              Frequency  Min 3X/week        Progress Toward Goals  OT Goals(current goals can now be found in the care plan section)  Progress towards OT goals: Progressing toward goals  Acute Rehab OT Goals Patient Stated Goal: return home with family and home aides to assist OT Goal Formulation: With patient Time For Goal Achievement: 10/28/18 Potential to Achieve Goals: Good  Plan Discharge plan remains appropriate    Co-evaluation                 AM-PAC OT "6 Clicks" Daily Activity     Outcome Measure   Help from another person eating meals?: A Little Help from another person taking care of personal grooming?: A Little Help from another person toileting, which includes using toliet, bedpan, or urinal?: A Lot Help from another person bathing (including washing, rinsing, drying)?: A Lot Help from another person to put on and taking off regular upper body clothing?: A Lot Help from another person to put on and taking off regular lower body clothing?: A Lot 6 Click Score: 14    End of Session Equipment Utilized During Treatment: Oxygen  OT Visit Diagnosis: Unsteadiness on feet (R26.81);Other abnormalities of gait and mobility (R26.89);Pain;Muscle weakness (generalized) (M62.81)   Activity Tolerance Patient tolerated treatment well   Patient Left in bed;with call bell/phone within reach   Nurse Communication Mobility status        Time: 1541-1640 OT Time Calculation (min): 59 min  Charges: OT General Charges $OT Visit: 1 Visit OT Treatments $Self Care/Home Management : 38-52 mins $Therapeutic Activity: 8-22  mins  Lou Cal, OT [facility] Pager [phone] Office [phone]    Raymondo Band 10/15/2018, 6:17 PM

## 2018-10-15 NOTE — Progress Notes (Signed)
[name] TEAM 1 - Stepdown/ICU TEAM  [name]  KNL:[id number] DOB: [birth year] DOA: 10/11/2018 PCP: [name], MD    Brief Narrative:  [phone] w/ a hx of chronic diastolic CHF (EF 40%), small cell lung cancer and colon cancer on chemotherapy under the care of Dr. [name], COPD on 2L [place] chronically, and DM who presented to the [facility] with a 10 pound weight gain over a week. Incidentally Stephanie SARS-CoV-2 test returned positive but a CXR was unremarkable.   Significant Events: 6/1 admit via [place] direct admit   COVID-19 specific Treatment: None indicated   Subjective: Hgb has trended down. Transfusing one unit today. C/o ongoing severe nausea. Is not able to tolerate consistent oral intake. Denies signif SOB. No vomiting. Some mild diffuse abdom cramping.   Assessment & Plan:  Acute on chronic diastolic heart failure dry weight appears to be 143kg - TTE 06/2018 noted EF of 60-65% - net negative ~8L since admit - continue to diurese towards goal weight - signif peripheral edema persists   Filed Weights   10/13/18 0459 10/14/18 0219 10/15/18 0500  Weight: (!) 149.1 kg (!) 151.7 kg (!) 148.8 kg    SARS-CoV-2 Positive No evidence of COVID at present - this is an incidental finding - follow closely   Recent Labs    10/13/18 0538 10/14/18 0410 10/15/18 0500  DDIMER 0.63* 0.59* 0.78*  FERRITIN 201 252 389*  LDH 174 175 189  CRP <0.8 <0.8 0.9    CKD stage III Baseline creatinine 1.5 - creatinine stable despite ongoing diuresis - follow trend   Recent Labs  Lab 10/11/18 0744 10/12/18 0532 10/13/18 0538 10/14/18 0410 10/15/18 0500  CREATININE 1.47* 1.36* 1.37* 1.30* 1.27*    Intractable Nausea ?due to chemotherapy v/s COVID gastroenteritis - cont to tx sx and follow clinically - intake remains very limited   Anemia of chronic disease Secondary to kidney disease + CA - Hgb continues to drift down and is now below transfusion threshold -  transfuse 1U PRBC today    DM CBG not ideal - follow with no change in tx today   Small cell lung (LUL) - R colon cancer (hepatic flexure) Not a candidate for surgery secondary to high risk - currently undergoing chemotherapy and XRT, which are presently on hold - to f/u w/ Oncology after d/c from [facility]   CAD Asymptomatic presently  Hyperlipidemia Continue Lipitor  Hypertension Blood pressure currently well controlled  COPD - Chronic hypoxic respiratory failure Stable at this time on minimal oxygen support  Hypothyroidism Continue Synthroid  Rheumatoid arthritis Continue usual home medications  Depression/anxiety Continue Xanax TID prn  Morbid obesity - Body mass index is 60.98 kg/m.   DVT prophylaxis: lovenox  Code Status: DNR - NO CODE Family Communication:  Disposition Plan: mobilize - PT/OT  Consultants:  Oncology (while at [facility])  Antimicrobials:  none   Objective: Blood pressure (!) 145/51, pulse 93, temperature (!) 100.6 F (38.1 C), temperature source Oral, resp. rate (!) 24, height 5' 1.5" (1.562 m), weight (!) 148.8 kg, SpO2 100 %.  Intake/Output Summary (Last 24 hours) at 10/15/2018 1149 Last data filed at 10/14/2018 2300 Gross per 24 hour  Intake 360 ml  Output 3651 ml  Net -3291 ml   Filed Weights   10/13/18 0459 10/14/18 0219 10/15/18 0500  Weight: (!) 149.1 kg (!) 151.7 kg (!) 148.8 kg    Examination: General: No acute respiratory distress - much more alert today -  tearful  Lungs: Distant breath sounds th/o - no wheezing - no crackles  Cardiovascular: Distant heart sounds, RRR - no M or rub  Abdomen: Morbidly obese, soft, bowel sounds positive, no rebound, no mass  Extremities: 2+ chronic appearing B LE edema   CBC: Recent Labs  Lab 10/13/18 0538 10/13/18 1455 10/14/18 0410 10/15/18 0500  WBC 7.3 6.7 4.0 3.4*  NEUTROABS 6.1  --  2.6 2.2  HGB 6.9* 8.2* 7.1* 6.8*  HCT 23.8* 27.6* 24.6* 22.3*  MCV 96.7 96.8 97.6 94.5  PLT 365 403*  362 545   Basic Metabolic Panel: Recent Labs  Lab 10/13/18 0538 10/14/18 0410 10/15/18 0500  NA 138 140 137  K 5.2* 4.3 3.8  CL 105 102 100  CO2 28 30 31   GLUCOSE 298* 160* 181*  BUN 29* 29* 32*  CREATININE 1.37* 1.30* 1.27*  CALCIUM 8.4* 8.3* 7.7*  MG 1.9  --   --    GFR: Estimated Creatinine Clearance: 59.5 mL/min (A) (by C-G formula based on SCr of 1.27 mg/dL (H)).  Liver Function Tests: Recent Labs  Lab 10/11/18 0744 10/13/18 0538 10/14/18 0410 10/15/18 0500  AST 11* 12* 13* 14*  ALT 8 10 11 10   ALKPHOS 131* 93 92 76  BILITOT 0.3 0.3 0.3 0.4  PROT 6.4* 5.8* 5.7* 5.4*  ALBUMIN 3.3* 3.1* 3.0* 2.9*    HbA1C: Hemoglobin A1C  Date/Time Value Ref Range Status  11/11/2016 7.5  Final  08/09/2016 9.6  Final   Hgb A1c MFr Bld  Date/Time Value Ref Range Status  08/30/2018 09:15 PM 7.5 (H) 4.8 - 5.6 % Final    Comment:    (NOTE)         Prediabetes: 5.7 - 6.4         Diabetes: >6.4         Glycemic control for adults with diabetes: <7.0   07/05/2018 11:38 AM 7.9 (H) 4.8 - 5.6 % Final    Comment:    (NOTE) Pre diabetes:          5.7%-6.4% Diabetes:              >6.4% Glycemic control for   <7.0% adults with diabetes     CBG: Recent Labs  Lab 10/14/18 1210 10/14/18 1617 10/14/18 2201 10/15/18 0716 10/15/18 1131  GLUCAP 248* 216* 213* 182* 246*    Recent Results (from the past 240 hour(s))  SARS Coronavirus 2 (CEPHEID- Performed in [place] hospital lab), Hosp Order     Status: Abnormal   Collection Time: 10/11/18  3:34 PM  Result Value Ref Range Status   SARS Coronavirus 2 POSITIVE (A) NEGATIVE Final    Comment: RESULT CALLED TO, READ BACK BY AND VERIFIED WITH: [place] [id number] @ [id number] BY J SCOTTON (NOTE) If result is NEGATIVE SARS-CoV-2 target nucleic acids are NOT DETECTED. The SARS-CoV-2 RNA is generally detectable in upper and lower  respiratory specimens during the acute phase of infection. The lowest  concentration of SARS-CoV-2  viral copies this assay can detect is 250  copies / mL. A negative result does not preclude SARS-CoV-2 infection  and should not be used as the sole basis for treatment or other  patient management decisions.  A negative result may occur with  improper specimen collection / handling, submission of specimen other  than nasopharyngeal swab, presence of viral mutation(s) within the  areas targeted by this assay, and inadequate number of viral copies  (<250 copies / mL). A negative  result must be combined with clinical  observations, patient history, and epidemiological information. If result is POSITIVE SARS-CoV-2 target nucleic acids are DETEC TED. The SARS-CoV-2 RNA is generally detectable in upper and lower  respiratory specimens during the acute phase of infection.  Positive  results are indicative of active infection with SARS-CoV-2.  Clinical  correlation with patient history and other diagnostic information is  necessary to determine patient infection status.  Positive results do  not rule out bacterial infection or co-infection with other viruses. If result is PRESUMPTIVE POSTIVE SARS-CoV-2 nucleic acids MAY BE PRESENT.   A presumptive positive result was obtained on the submitted specimen  and confirmed on repeat testing.  While 2019 novel coronavirus  (SARS-CoV-2) nucleic acids may be present in the submitted sample  additional confirmatory testing may be necessary for epidemiological  and / or clinical management purposes  to differentiate between  SARS-CoV-2 and other Sarbecovirus currently known to infect humans.  If clinically indicated additional testing with an alternate test  methodology (LAB7 453) is advised. The SARS-CoV-2 RNA is generally  detectable in upper and lower respiratory specimens during the acute  phase of infection. The expected result is Negative. Fact Sheet for Patients:  StrictlyIdeas.no Fact Sheet for Healthcare Providers:  BankingDealers.co.za This test is not yet approved or cleared by the [place] FDA and has been authorized for detection and/or diagnosis of SARS-CoV-2 by FDA under an Emergency Use Authorization (EUA).  This EUA will remain in effect (meaning this test can be used) for the duration of the COVID-19 declaration under Section 564(b)(1) of the Act, 21 U.S.C. section 360bbb-3(b)(1), unless the authorization is terminated or revoked sooner. Performed at [facility], [place] [address], [place], [place] [address]      Scheduled Meds: . sodium chloride   Intravenous Once  . allopurinol  300 mg Oral Daily  . aspirin EC  81 mg Oral Daily  . atorvastatin  80 mg Oral QPM  . cholecalciferol  5,000 Units Oral Daily  . docusate sodium  300 mg Oral Daily  . furosemide  60 mg Intravenous BID  . gabapentin  600 mg Oral TID  . Gerhardt's butt cream   Topical TID  . insulin aspart  0-20 Units Subcutaneous TID WC  . insulin aspart  0-5 Units Subcutaneous QHS  . insulin detemir  44 Units Subcutaneous QHS  . levothyroxine  200 mcg Oral Daily  . nystatin   Topical TID  . pantoprazole  40 mg Oral BID  . polyethylene glycol  17 g Oral BID  . senna  2 tablet Oral BID WC  . topiramate  75 mg Oral QHS  . umeclidinium-vilanterol  1 puff Inhalation Daily  . vitamin C  500 mg Oral BID  . zinc sulfate  220 mg Oral Daily     LOS: 4 days   Cherene Altes, MD [facility] Office  [phone] Pager - Text Page per Amion  If 7PM-7AM, please contact night-coverage per Amion 10/15/2018, 11:49 AM

## 2018-10-15 NOTE — Progress Notes (Signed)
Inpatient Diabetes Program Recommendations  AACE/ADA: New Consensus Statement on Inpatient Glycemic Control (2015)  Target Ranges:  Prepandial:   less than 140 mg/dL      Peak postprandial:   less than 180 mg/dL (1-2 hours)      Critically ill patients:  140 - 180 mg/dL   Lab Results  Component Value Date   GLUCAP 182 (H) 10/15/2018   HGBA1C 7.5 (H) 08/30/2018    Review of Glycemic Control Results for Stephanie Combs, Stephanie Combs (MRN [id number]) as of 10/15/2018 09:46  Ref. Range 10/14/2018 07:59 10/14/2018 12:10 10/14/2018 16:17 10/14/2018 22:01 10/15/2018 07:16  Glucose-Capillary Latest Ref Range: 70 - 99 mg/dL 124 (H) 248 (H) 216 (H) 213 (H) 182 (H)  Diabetes history:DM 2 Outpatient Diabetes medications:Levemir 40 units, Novolog 0-20 units tid + hs  Current orders for Inpatient glycemic control: Levemir 44 units q HS Novolog 0-20 units tid Inpatient Diabetes Program Recommendations:   Note poor intake.  If appropriate, consider increasing Levemir to 48 units daily.   Once eating more consistently, may benefit from the addition of meal coverage.   Thanks  Adah Perl, RN, BC-ADM Inpatient Diabetes Coordinator Pager [phone] (8a-5p)

## 2018-10-15 NOTE — TOC Progression Note (Signed)
Transition of Care [facility]) - Progression Note    Patient Details  Name: Stephanie Combs MRN: [id number] Date of Birth: [birth year]  Transition of Care [facility]) CM/SW [place] RN, BSN, NCM-BC, ACM-RN [phone] (working remotely) Phone Number: 10/15/2018, 4:24 PM  Clinical Narrative:    CM following for dispositional needs. CM contacted the patient via phone to discuss the POC. The patient states she lives at home alone, with assistance provided by her daughter and her church friends. Patient was open to [facility] services with [facility], but declined services PTA. CM discussed PT/OT recommendations, with patient agreeable to resumption of [place] services being provided by [facility]. Patient indicated her daughter can provide transportation home, and her portable oxygen tank. CM team will continue to follow.   Expected Discharge Plan: [place] Barriers to Discharge: Continued Medical Work up  Expected Discharge Plan and Services Expected Discharge Plan: [place] In-house Referral: [facility] Discharge Planning Services: CM Consult Post Acute Care Choice: [place] arrangements for the past 2 months: Single Family Home                 DME Arranged: N/A DME Agency: NA       HH Arranged: RN, Disease Management, PT, OT, Nurse's Aide [place] Agency: [facility] Date [place] Agency Contacted: 10/15/18 Time [place]: 1621 Representative spoke with at [place]: [place] ([place]) Interventions    Readmission Risk Interventions Readmission Risk Prevention Plan 10/13/2018 09/02/2018 08/31/2018  Transportation Screening Complete - Complete  Medication Review Press photographer) Complete - -  PCP or Specialist appointment within 3-5 days of discharge (No Data) Complete -  PCP/Specialist Appt Not Complete comments Recent admit; remains acutely ill - -  HRI or Home Care Consult Complete - Complete  SW  Recovery Care/Counseling Consult Complete - Complete  Palliative Care Screening Not Applicable - Not [place] Not Applicable - Not Applicable  Some recent data might be hidden

## 2018-10-16 LAB — CBC WITH DIFFERENTIAL/PLATELET
Abs Immature Granulocytes: 0.02 10*3/uL (ref 0.00–0.07)
Basophils Absolute: 0 10*3/uL (ref 0.0–0.1)
Basophils Relative: 1 %
Eosinophils Absolute: 0 10*3/uL (ref 0.0–0.5)
Eosinophils Relative: 0 %
HCT: 25.1 % — ABNORMAL LOW (ref 36.0–46.0)
Hemoglobin: 7.6 g/dL — ABNORMAL LOW (ref 12.0–15.0)
Immature Granulocytes: 1 %
Lymphocytes Relative: 24 %
Lymphs Abs: 0.8 10*3/uL (ref 0.7–4.0)
MCH: 28.7 pg (ref 26.0–34.0)
MCHC: 30.3 g/dL (ref 30.0–36.0)
MCV: 94.7 fL (ref 80.0–100.0)
Monocytes Absolute: 0.1 10*3/uL (ref 0.1–1.0)
Monocytes Relative: 4 %
Neutro Abs: 2.3 10*3/uL (ref 1.7–7.7)
Neutrophils Relative %: 70 %
Platelets: 319 10*3/uL (ref 150–400)
RBC: 2.65 MIL/uL — ABNORMAL LOW (ref 3.87–5.11)
RDW: 22.8 % — ABNORMAL HIGH (ref 11.5–15.5)
WBC: 3.3 10*3/uL — ABNORMAL LOW (ref 4.0–10.5)
nRBC: 0 % (ref 0.0–0.2)

## 2018-10-16 LAB — COMPREHENSIVE METABOLIC PANEL
ALT: 10 U/L (ref 0–44)
AST: 18 U/L (ref 15–41)
Albumin: 2.9 g/dL — ABNORMAL LOW (ref 3.5–5.0)
Alkaline Phosphatase: 77 U/L (ref 38–126)
Anion gap: 8 (ref 5–15)
BUN: 34 mg/dL — ABNORMAL HIGH (ref 8–23)
CO2: 32 mmol/L (ref 22–32)
Calcium: 7.8 mg/dL — ABNORMAL LOW (ref 8.9–10.3)
Chloride: 98 mmol/L (ref 98–111)
Creatinine, Ser: 1.14 mg/dL — ABNORMAL HIGH (ref 0.44–1.00)
GFR calc Af Amer: 57 mL/min — ABNORMAL LOW (ref >60)
GFR calc non Af Amer: 49 mL/min — ABNORMAL LOW (ref >60)
Glucose, Bld: 200 mg/dL — ABNORMAL HIGH (ref 70–99)
Potassium: 3.8 mmol/L (ref 3.5–5.1)
Sodium: 138 mmol/L (ref 135–145)
Total Bilirubin: 0.3 mg/dL (ref 0.3–1.2)
Total Protein: 5.7 g/dL — ABNORMAL LOW (ref 6.5–8.1)

## 2018-10-16 LAB — BPAM RBC
Blood Product Expiration Date: 202006202359
Blood Product Expiration Date: 202006232359
ISSUE DATE / TIME: 202006051036
Unit Type and Rh: 6200
Unit Type and Rh: 6200

## 2018-10-16 LAB — D-DIMER, QUANTITATIVE: D-Dimer, Quant: 0.65 ug/mL-FEU — ABNORMAL HIGH (ref 0.00–0.50)

## 2018-10-16 LAB — TYPE AND SCREEN
ABO/RH(D): A POS
Antibody Screen: POSITIVE
Donor AG Type: NEGATIVE
Unit division: 0
Unit division: 0

## 2018-10-16 LAB — GLUCOSE, CAPILLARY
Glucose-Capillary: 178 mg/dL — ABNORMAL HIGH (ref 70–99)
Glucose-Capillary: 198 mg/dL — ABNORMAL HIGH (ref 70–99)
Glucose-Capillary: 179 mg/dL — ABNORMAL HIGH (ref 70–99)
Glucose-Capillary: 214 mg/dL — ABNORMAL HIGH (ref 70–99)

## 2018-10-16 LAB — FERRITIN: Ferritin: 533 ng/mL — ABNORMAL HIGH (ref 11–307)

## 2018-10-16 LAB — C-REACTIVE PROTEIN: CRP: 1.3 mg/dL — ABNORMAL HIGH (ref <1.0)

## 2018-10-16 LAB — LACTATE DEHYDROGENASE: LDH: 190 U/L (ref 98–192)

## 2018-10-16 MED ORDER — PROCHLORPERAZINE EDISYLATE 10 MG/2ML IJ SOLN
10.0000 mg | Freq: Four times a day (QID) | INTRAMUSCULAR | Status: DC | PRN
  Filled 2018-10-16: qty 2

## 2018-10-16 MED ORDER — ONDANSETRON HCL 4 MG/2ML IJ SOLN
4.0000 mg | INTRAMUSCULAR | Status: DC | PRN
  Administered 2018-10-16 – 2018-10-30 (×40): 4 mg via INTRAVENOUS
  Filled 2018-10-16 (×42): qty 2

## 2018-10-16 NOTE — Progress Notes (Signed)
[name] TEAM 1 - Stepdown/ICU TEAM  Stephanie Combs  ZOX:[id number] DOB: [birth year] DOA: 10/11/2018 PCP: Stephanie Gravel, MD    Brief Narrative:  [phone] w/ a hx of chronic diastolic CHF (EF 14%), small cell lung cancer and colon cancer on chemotherapy under the care of Dr. [name], COPD on 2L [place] chronically, and DM who presented to the [facility] with a 10 pound weight gain over a week. Incidentally her SARS-CoV-2 test returned positive but a CXR was unremarkable.   Significant Events: 6/1 admit via [place] direct admit   COVID-19 specific Treatment: None indicated   Subjective: The patient reports unrelenting nausea with abdominal cramps.  She denies fevers chills shortness of breath or chest pain.  She states Zofran is helping with her nausea.  Assessment & Plan:  Acute on chronic diastolic heart failure dry weight appears to be 143kg - TTE 06/2018 noted EF of 60-65% - net negative ~9.5L since admit - continue to diurese towards goal weight - signif peripheral edema persists but is improving   Filed Weights   10/14/18 0219 10/15/18 0500 10/16/18 0500  Weight: (!) 151.7 kg (!) 148.8 kg (!) 146.1 kg    SARS-CoV-2 Positive No clinical evidence of COVID at present - this is an incidental finding - follow closely   Recent Labs    10/14/18 0410 10/15/18 0500 10/16/18 0230  DDIMER 0.59* 0.78* 0.65*  FERRITIN 252 389* 533*  LDH 175 189 190  CRP <0.8 0.9 1.3*    CKD stage III Baseline creatinine 1.5 - creatinine improving with ongoing diuresis - follow trend   Recent Labs  Lab 10/12/18 0532 10/13/18 0538 10/14/18 0410 10/15/18 0500 10/16/18 0230  CREATININE 1.36* 1.37* 1.30* 1.27* 1.14*    Intractable Nausea ?due to chemotherapy v/s COVID gastroenteritis - cont to tx sx and follow clinically - intake remains very limited   Anemia of chronic disease Secondary to kidney disease + CA - Hgb dropped below transfusion threshold 6/5 and patient received 1  unit PRBC -hemoglobin improved appropriately  Recent Labs  Lab 10/13/18 0538 10/13/18 1455 10/14/18 0410 10/15/18 0500 10/16/18 0230  HGB 6.9* 8.2* 7.1* 6.8* 7.6*    DM CBG improved -continue to follow  Small cell lung (LUL) - R colon cancer (hepatic flexure) Not a candidate for surgery secondary to high risk - currently undergoing chemotherapy and XRT, which are presently on hold - to f/u w/ Oncology after d/c from [facility]   CAD Asymptomatic presently  Hyperlipidemia Hold Lipitor until oral intake more consistent  Hypertension Blood pressure currently well controlled  COPD - Chronic hypoxic respiratory failure Stable at this time on minimal oxygen support  Hypothyroidism Continue Synthroid  Rheumatoid arthritis Continue usual home medications  Depression/anxiety Continue Xanax TID prn  Morbid obesity - Body mass index is 59.87 kg/m.   DVT prophylaxis: lovenox  Code Status: DNR - NO CODE Family Communication:  Disposition Plan: mobilize - PT/OT  Consultants:  Oncology (while at [facility])  Antimicrobials:  none   Objective: Blood pressure (!) 140/54, pulse 90, temperature (!) 100.6 F (38.1 C), temperature source Oral, resp. rate 16, height 5' 1.5" (1.562 m), weight (!) 146.1 kg, SpO2 97 %.  Intake/Output Summary (Last 24 hours) at 10/16/2018 0825 Last data filed at 10/16/2018 0200 Gross per 24 hour  Intake 795 ml  Output 2450 ml  Net -1655 ml   Filed Weights   10/14/18 0219 10/15/18 0500 10/16/18 0500  Weight: (!) 151.7 kg (!) 148.8  kg (!) 146.1 kg    Examination: General: No acute respiratory distress -appears uncomfortable Lungs: Distant breath sounds th/o without wheezing or focal crackles Cardiovascular: Distant heart sounds, RRR  Abdomen: Morbidly obese, soft, BS+, no rebound, no mass  Extremities: 2+ chronic appearing B LE edema without significant change  CBC: Recent Labs  Lab 10/14/18 0410 10/15/18 0500 10/16/18 0230  WBC 4.0 3.4* 3.3*   NEUTROABS 2.6 2.2 2.3  HGB 7.1* 6.8* 7.6*  HCT 24.6* 22.3* 25.1*  MCV 97.6 94.5 94.7  PLT 362 318 275   Basic Metabolic Panel: Recent Labs  Lab 10/13/18 0538 10/14/18 0410 10/15/18 0500 10/16/18 0230  NA 138 140 137 138  K 5.2* 4.3 3.8 3.8  CL 105 102 100 98  CO2 28 30 31  32  GLUCOSE 298* 160* 181* 200*  BUN 29* 29* 32* 34*  CREATININE 1.37* 1.30* 1.27* 1.14*  CALCIUM 8.4* 8.3* 7.7* 7.8*  MG 1.9  --   --   --    GFR: Estimated Creatinine Clearance: 65.5 mL/min (A) (by C-G formula based on SCr of 1.14 mg/dL (H)).  Liver Function Tests: Recent Labs  Lab 10/13/18 0538 10/14/18 0410 10/15/18 0500 10/16/18 0230  AST 12* 13* 14* 18  ALT 10 11 10 10   ALKPHOS 93 92 76 77  BILITOT 0.3 0.3 0.4 0.3  PROT 5.8* 5.7* 5.4* 5.7*  ALBUMIN 3.1* 3.0* 2.9* 2.9*    HbA1C: Hemoglobin A1C  Date/Time Value Ref Range Status  11/11/2016 7.5  Final  08/09/2016 9.6  Final   Hgb A1c MFr Bld  Date/Time Value Ref Range Status  08/30/2018 09:15 PM 7.5 (H) 4.8 - 5.6 % Final    Comment:    (NOTE)         Prediabetes: 5.7 - 6.4         Diabetes: >6.4         Glycemic control for adults with diabetes: <7.0   07/05/2018 11:38 AM 7.9 (H) 4.8 - 5.6 % Final    Comment:    (NOTE) Pre diabetes:          5.7%-6.4% Diabetes:              >6.4% Glycemic control for   <7.0% adults with diabetes     CBG: Recent Labs  Lab 10/15/18 0716 10/15/18 1131 10/15/18 1557 10/15/18 2008 10/16/18 0757  GLUCAP 182* 246* 225* 217* 178*    Recent Results (from the past 240 hour(s))  SARS Coronavirus 2 (CEPHEID- Performed in [place] hospital lab), Hosp Order     Status: Abnormal   Collection Time: 10/11/18  3:34 PM  Result Value Ref Range Status   SARS Coronavirus 2 POSITIVE (A) NEGATIVE Final    Comment: RESULT CALLED TO, READ BACK BY AND VERIFIED WITH: [place] [id number] @ 4944 BY J SCOTTON (NOTE) If result is NEGATIVE SARS-CoV-2 target nucleic acids are NOT DETECTED. The  SARS-CoV-2 RNA is generally detectable in upper and lower  respiratory specimens during the acute phase of infection. The lowest  concentration of SARS-CoV-2 viral copies this assay can detect is 250  copies / mL. A negative result does not preclude SARS-CoV-2 infection  and should not be used as the sole basis for treatment or other  patient management decisions.  A negative result may occur with  improper specimen collection / handling, submission of specimen other  than nasopharyngeal swab, presence of viral mutation(s) within the  areas targeted by this assay, and inadequate number of  viral copies  (<250 copies / mL). A negative result must be combined with clinical  observations, patient history, and epidemiological information. If result is POSITIVE SARS-CoV-2 target nucleic acids are DETEC TED. The SARS-CoV-2 RNA is generally detectable in upper and lower  respiratory specimens during the acute phase of infection.  Positive  results are indicative of active infection with SARS-CoV-2.  Clinical  correlation with patient history and other diagnostic information is  necessary to determine patient infection status.  Positive results do  not rule out bacterial infection or co-infection with other viruses. If result is PRESUMPTIVE POSTIVE SARS-CoV-2 nucleic acids MAY BE PRESENT.   A presumptive positive result was obtained on the submitted specimen  and confirmed on repeat testing.  While 2019 novel coronavirus  (SARS-CoV-2) nucleic acids may be present in the submitted sample  additional confirmatory testing may be necessary for epidemiological  and / or clinical management purposes  to differentiate between  SARS-CoV-2 and other Sarbecovirus currently known to infect humans.  If clinically indicated additional testing with an alternate test  methodology (LAB7 453) is advised. The SARS-CoV-2 RNA is generally  detectable in upper and lower respiratory specimens during the acute   phase of infection. The expected result is Negative. Fact Sheet for Patients:  StrictlyIdeas.no Fact Sheet for Healthcare Providers: BankingDealers.co.za This test is not yet approved or cleared by the [place] FDA and has been authorized for detection and/or diagnosis of SARS-CoV-2 by FDA under an Emergency Use Authorization (EUA).  This EUA will remain in effect (meaning this test can be used) for the duration of the COVID-19 declaration under Section 564(b)(1) of the Act, 21 U.S.C. section 360bbb-3(b)(1), unless the authorization is terminated or revoked sooner. Performed at [facility], [place] [address], [place], [place] [address]      Scheduled Meds: . sodium chloride   Intravenous Once  . allopurinol  300 mg Oral Daily  . aspirin EC  81 mg Oral Daily  . atorvastatin  80 mg Oral QPM  . cholecalciferol  5,000 Units Oral Daily  . docusate sodium  300 mg Oral Daily  . furosemide  80 mg Intravenous BID  . gabapentin  600 mg Oral TID  . Gerhardt's butt cream   Topical TID  . insulin aspart  0-20 Units Subcutaneous TID WC  . insulin aspart  0-5 Units Subcutaneous QHS  . insulin detemir  48 Units Subcutaneous QHS  . levothyroxine  200 mcg Oral Daily  . nystatin   Topical TID  . pantoprazole  40 mg Oral BID  . polyethylene glycol  17 g Oral BID  . senna  2 tablet Oral BID WC  . topiramate  75 mg Oral QHS  . umeclidinium-vilanterol  1 puff Inhalation Daily  . vitamin C  500 mg Oral BID  . zinc sulfate  220 mg Oral Daily     LOS: 5 days   Cherene Altes, MD [facility] Office  [phone] Pager - Text Page per Amion  If 7PM-7AM, please contact night-coverage per Amion 10/16/2018, 8:25 AM

## 2018-10-17 LAB — CBC
HCT: 25.3 % — ABNORMAL LOW (ref 36.0–46.0)
Hemoglobin: 7.8 g/dL — ABNORMAL LOW (ref 12.0–15.0)
MCH: 29.1 pg (ref 26.0–34.0)
MCHC: 30.8 g/dL (ref 30.0–36.0)
MCV: 94.4 fL (ref 80.0–100.0)
Platelets: 294 10*3/uL (ref 150–400)
RBC: 2.68 MIL/uL — ABNORMAL LOW (ref 3.87–5.11)
RDW: 23.1 % — ABNORMAL HIGH (ref 11.5–15.5)
WBC: 3.1 10*3/uL — ABNORMAL LOW (ref 4.0–10.5)
nRBC: 0 % (ref 0.0–0.2)

## 2018-10-17 LAB — BASIC METABOLIC PANEL
Anion gap: 9 (ref 5–15)
BUN: 33 mg/dL — ABNORMAL HIGH (ref 8–23)
CO2: 33 mmol/L — ABNORMAL HIGH (ref 22–32)
Calcium: 7.9 mg/dL — ABNORMAL LOW (ref 8.9–10.3)
Chloride: 95 mmol/L — ABNORMAL LOW (ref 98–111)
Creatinine, Ser: 1.19 mg/dL — ABNORMAL HIGH (ref 0.44–1.00)
GFR calc Af Amer: 54 mL/min — ABNORMAL LOW (ref >60)
GFR calc non Af Amer: 47 mL/min — ABNORMAL LOW (ref >60)
Glucose, Bld: 152 mg/dL — ABNORMAL HIGH (ref 70–99)
Potassium: 3.6 mmol/L (ref 3.5–5.1)
Sodium: 137 mmol/L (ref 135–145)

## 2018-10-17 LAB — MAGNESIUM: Magnesium: 1.8 mg/dL (ref 1.7–2.4)

## 2018-10-17 LAB — GLUCOSE, CAPILLARY
Glucose-Capillary: 92 mg/dL (ref 70–99)
Glucose-Capillary: 125 mg/dL — ABNORMAL HIGH (ref 70–99)

## 2018-10-17 LAB — PHOSPHORUS: Phosphorus: 3.5 mg/dL (ref 2.5–4.6)

## 2018-10-17 MED ORDER — ENOXAPARIN SODIUM 80 MG/0.8ML ~~LOC~~ SOLN
70.0000 mg | SUBCUTANEOUS | Status: DC
  Administered 2018-10-17 – 2018-10-28 (×12): 70 mg via SUBCUTANEOUS
  Filled 2018-10-17 (×12): qty 0.8

## 2018-10-17 NOTE — Progress Notes (Signed)
PHARMACY CONSULT: Lovenox for VTE prophylaxis COVID protocol  Wt: 145.7kg BMI:  59.7 Scr:  1.19 CrCl >30 ml/hr  Hgb: 7.8 s/p PRBC 6/5 Pltc: 294 D-dimer: 0.65  A/P:  Due to BMI > 35, begin weight-adjusted lovenox 0.5mg /kg sq q24h  Monitor weight, CBC and renal function, adjust as needed  Peggyann Juba, PharmD, BCPS Pharmacy: [phone] 10/17/2018 3:24 PM

## 2018-10-17 NOTE — Progress Notes (Signed)
Telephone call to patient's daughter Silvano Rusk at 0900hrs, no answer and again at  1200hrs, updated on plan of care and gave my contact information if she had questions today.

## 2018-10-17 NOTE — Progress Notes (Signed)
[place] TEAM 1 - Stepdown/ICU TEAM  Stephanie Combs  OAC:[id number] DOB: [birth year] DOA: 10/11/2018 PCP: [name], MD    Brief Narrative:  [phone] w/ a hx of chronic diastolic CHF (EF 32%), small cell lung cancer and colon cancer on chemotherapy under the care of Dr. [name], COPD on 2L [place] chronically, and DM who presented to the [facility] with a 10 pound weight gain over a week. Incidentally her SARS-CoV-2 test returned positive but a CXR was unremarkable.   Significant Events: 6/1 admit via [place] direct admit   COVID-19 specific Treatment: None indicated   Subjective: C/o ongoing unrelenting nausea. Denies similar sx w/ prior chemo tx. Reports daily bowel movements, and no diarrhea. No SOB, cp, abdom pain.   Assessment & Plan:  Acute on chronic diastolic heart failure dry weight appears to be 143kg - TTE 06/2018 noted EF of 60-65% - net negative ~10L since admit - continue to diurese towards goal weight - peripheral edema continues to improve    Filed Weights   10/15/18 0500 10/16/18 0500 10/17/18 0500  Weight: (!) 148.8 kg (!) 146.1 kg (!) 145.7 kg    SARS-CoV-2 Positive No clinical evidence of COVID at present - this is an incidental finding, unless her GI sx represent a manifestation - resp status stable    CKD stage III Baseline creatinine 1.5 - creatinine improving with ongoing diuresis - follow trend as crt up slightly today   Recent Labs  Lab 10/13/18 0538 10/14/18 0410 10/15/18 0500 10/16/18 0230 10/17/18 0330  CREATININE 1.37* 1.30* 1.27* 1.14* 1.19*    Intractable Nausea ?due to chemotherapy v/s COVID gastroenteritis - cont to tx sx and follow clinically - intake remains very limited - reports allergies to reglan and phenergan  Anemia of chronic disease Secondary to kidney disease + CA - Hgb dropped below transfusion threshold 6/5 and patient received 1 unit PRBC -hemoglobin improved appropriately  Recent Labs  Lab 10/13/18 1455  10/14/18 0410 10/15/18 0500 10/16/18 0230 10/17/18 0330  HGB 8.2* 7.1* 6.8* 7.6* 7.8*    DM No change in tx today    Small cell lung CA (LUL) - R colon cancer (hepatic flexure) Not a candidate for surgery secondary to high risk - currently undergoing chemotherapy and XRT, which are presently on hold - to f/u w/ Oncology after d/[facility]   CAD Asymptomatic presently  Hyperlipidemia Hold Lipitor until oral intake more consistent  Hypertension Blood pressure reasonably controlled  COPD - Chronic hypoxic respiratory failure Stable at this time on minimal oxygen support - wean to RA as able   Hypothyroidism Continue Synthroid  Rheumatoid arthritis Continue usual home medications  Depression/anxiety Continue Xanax TID prn  Morbid obesity - Body mass index is 59.71 kg/m.   DVT prophylaxis: lovenox  Code Status: DNR - NO CODE Family Communication:  Disposition Plan: mobilize - PT/OT  Consultants:  Oncology (while at [facility])  Antimicrobials:  none   Objective: Blood pressure 133/61, pulse 86, temperature 100.1 F (37.8 C), temperature source Oral, resp. rate (!) 23, height 5' 1.5" (1.562 m), weight (!) 145.7 kg, SpO2 92 %.  Intake/Output Summary (Last 24 hours) at 10/17/2018 0749 Last data filed at 10/17/2018 0700 Gross per 24 hour  Intake 240 ml  Output 900 ml  Net -660 ml   Filed Weights   10/15/18 0500 10/16/18 0500 10/17/18 0500  Weight: (!) 148.8 kg (!) 146.1 kg (!) 145.7 kg    Examination: General: No acute  respiratory distress - up in chair  Lungs: Distant breath sounds th/o due to body habitus - no wheezing  Cardiovascular: Distant heart sounds, RRR  Abdomen: Morbidly obese, soft, BS+, no rebound, no mass  Extremities: 2+ chronic appearing B LE edema   CBC: Recent Labs  Lab 10/14/18 0410 10/15/18 0500 10/16/18 0230 10/17/18 0330  WBC 4.0 3.4* 3.3* 3.1*  NEUTROABS 2.6 2.2 2.3  --   HGB 7.1* 6.8* 7.6* 7.8*  HCT 24.6* 22.3* 25.1* 25.3*   MCV 97.6 94.5 94.7 94.4  PLT 362 318 319 762   Basic Metabolic Panel: Recent Labs  Lab 10/13/18 0538  10/15/18 0500 10/16/18 0230 10/17/18 0330  NA 138   < > 137 138 137  K 5.2*   < > 3.8 3.8 3.6  CL 105   < > 100 98 95*  CO2 28   < > 31 32 33*  GLUCOSE 298*   < > 181* 200* 152*  BUN 29*   < > 32* 34* 33*  CREATININE 1.37*   < > 1.27* 1.14* 1.19*  CALCIUM 8.4*   < > 7.7* 7.8* 7.9*  MG 1.9  --   --   --  1.8  PHOS  --   --   --   --  3.5   < > = values in this interval not displayed.   GFR: Estimated Creatinine Clearance: 62.6 mL/min (A) (by C-G formula based on SCr of 1.19 mg/dL (H)).  Liver Function Tests: Recent Labs  Lab 10/13/18 0538 10/14/18 0410 10/15/18 0500 10/16/18 0230  AST 12* 13* 14* 18  ALT 10 11 10 10   ALKPHOS 93 92 76 77  BILITOT 0.3 0.3 0.4 0.3  PROT 5.8* 5.7* 5.4* 5.7*  ALBUMIN 3.1* 3.0* 2.9* 2.9*    HbA1C: Hemoglobin A1C  Date/Time Value Ref Range Status  11/11/2016 7.5  Final  08/09/2016 9.6  Final   Hgb A1c MFr Bld  Date/Time Value Ref Range Status  08/30/2018 09:15 PM 7.5 (H) 4.8 - 5.6 % Final    Comment:    (NOTE)         Prediabetes: 5.7 - 6.4         Diabetes: >6.4         Glycemic control for adults with diabetes: <7.0   07/05/2018 11:38 AM 7.9 (H) 4.8 - 5.6 % Final    Comment:    (NOTE) Pre diabetes:          5.7%-6.4% Diabetes:              >6.4% Glycemic control for   <7.0% adults with diabetes     CBG: Recent Labs  Lab 10/15/18 2008 10/16/18 0757 10/16/18 1139 10/16/18 1625 10/16/18 2043  GLUCAP 217* 178* 198* 179* 214*    Recent Results (from the past 240 hour(s))  SARS Coronavirus 2 (CEPHEID- Performed in [place] hospital lab), Hosp Order     Status: Abnormal   Collection Time: 10/11/18  3:34 PM  Result Value Ref Range Status   SARS Coronavirus 2 POSITIVE (A) NEGATIVE Final    Comment: RESULT CALLED TO, READ BACK BY AND VERIFIED WITH: [place] [id number] @ 6160 BY J SCOTTON (NOTE) If result is  NEGATIVE SARS-CoV-2 target nucleic acids are NOT DETECTED. The SARS-CoV-2 RNA is generally detectable in upper and lower  respiratory specimens during the acute phase of infection. The lowest  concentration of SARS-CoV-2 viral copies this assay can detect is 250  copies / mL.  A negative result does not preclude SARS-CoV-2 infection  and should not be used as the sole basis for treatment or other  patient management decisions.  A negative result may occur with  improper specimen collection / handling, submission of specimen other  than nasopharyngeal swab, presence of viral mutation(s) within the  areas targeted by this assay, and inadequate number of viral copies  (<250 copies / mL). A negative result must be combined with clinical  observations, patient history, and epidemiological information. If result is POSITIVE SARS-CoV-2 target nucleic acids are DETEC TED. The SARS-CoV-2 RNA is generally detectable in upper and lower  respiratory specimens during the acute phase of infection.  Positive  results are indicative of active infection with SARS-CoV-2.  Clinical  correlation with patient history and other diagnostic information is  necessary to determine patient infection status.  Positive results do  not rule out bacterial infection or co-infection with other viruses. If result is PRESUMPTIVE POSTIVE SARS-CoV-2 nucleic acids MAY BE PRESENT.   A presumptive positive result was obtained on the submitted specimen  and confirmed on repeat testing.  While 2019 novel coronavirus  (SARS-CoV-2) nucleic acids may be present in the submitted sample  additional confirmatory testing may be necessary for epidemiological  and / or clinical management purposes  to differentiate between  SARS-CoV-2 and other Sarbecovirus currently known to infect humans.  If clinically indicated additional testing with an alternate test  methodology (LAB7 453) is advised. The SARS-CoV-2 RNA is generally  detectable  in upper and lower respiratory specimens during the acute  phase of infection. The expected result is Negative. Fact Sheet for Patients:  StrictlyIdeas.no Fact Sheet for Healthcare Providers: BankingDealers.co.za This test is not yet approved or cleared by the [place] FDA and has been authorized for detection and/or diagnosis of SARS-CoV-2 by FDA under an Emergency Use Authorization (EUA).  This EUA will remain in effect (meaning this test can be used) for the duration of the COVID-19 declaration under Section 564(b)(1) of the Act, 21 U.S.C. section 360bbb-3(b)(1), unless the authorization is terminated or revoked sooner. Performed at [facility], [place] [address] [place], [place] [address]      Scheduled Meds: . sodium chloride   Intravenous Once  . allopurinol  300 mg Oral Daily  . aspirin EC  81 mg Oral Daily  . cholecalciferol  5,000 Units Oral Daily  . docusate sodium  300 mg Oral Daily  . furosemide  80 mg Intravenous BID  . gabapentin  600 mg Oral TID  . Gerhardt's butt cream   Topical TID  . insulin aspart  0-20 Units Subcutaneous TID WC  . insulin aspart  0-5 Units Subcutaneous QHS  . insulin detemir  48 Units Subcutaneous QHS  . levothyroxine  200 mcg Oral Daily  . nystatin   Topical TID  . pantoprazole  40 mg Oral BID  . polyethylene glycol  17 g Oral BID  . senna  2 tablet Oral BID WC  . topiramate  75 mg Oral QHS  . umeclidinium-vilanterol  1 puff Inhalation Daily  . vitamin C  500 mg Oral BID  . zinc sulfate  220 mg Oral Daily     LOS: 6 days   Cherene Altes, MD [facility] Office  [phone] Pager - Text Page per Amion  If 7PM-7AM, please contact night-coverage per Amion 10/17/2018, 7:49 AM

## 2018-10-17 NOTE — Progress Notes (Signed)
Occupational Therapy Treatment Patient Details Name: Stephanie Combs MRN: [id number] DOB: May 03, 1951 Today's Date: 10/17/2018    History of present illness On 10/11/18, Stephanie Combs is a 68 y.o. female with medical history significant of diastolic heart failure, anxiety, COPD, diabetes mellitus, chronic respiratory failure on 2 L via nasal cannula, lung and colon cancer received chemo and radiation therapy.  Oncology referred pt for admission due to a 10 pound weight gain over the last week.  She is try to manage this with oral Lasix.  Reports some associated shortness of breath as well.  She has associated lower extremity swelling with weeping which is chronic. On admission she tested + COVID-19. per MD primarily acute on chronic CHF with COVID + an incidental finding. Transfer from [place] to [place].    OT comments  Pt progressing towards established OT goals. Pt agreeable to BUE exercises at bed level reporting that she sat in the chair for lunch and has increased pain at BLEs. Pt performing 10 reps of BUE and BLE AROM/AAROM exercises and then reporting need to use BSC. Pt requiring Min Guard A for functional transfer to [facility]. Pt completing sponge bath while seated at [facility] with Min A for UB bathing and Max A for peri care. Pt performing oral care with set up once supine in bed. VSS throughout on 2L O2. Notified RN of pt having large BM and with pain, heat, and redness at BLEs. Continue to recommend dc to home with HHOT and will continue to follow acutely .   Follow Up Recommendations  Home health OT;Supervision/Assistance - 24 hour    Equipment Recommendations  None recommended by OT    Recommendations for Other Services      Precautions / Restrictions Precautions Precautions: Fall Restrictions Weight Bearing Restrictions: No       Mobility Bed Mobility Overal bed mobility: Needs Assistance Bed Mobility: Supine to Sit;Sit to Supine     Supine to sit: Supervision Sit to supine: Min  assist   General bed mobility comments: Increased time and supervision into sitting. Min A for assistanace for BLEs over EOB.   Transfers Overall transfer level: Needs assistance Equipment used: None Transfers: Sit to/from [facility] Sit to Stand: Min guard Stand pivot transfers: Min guard       General transfer comment: for general safety    Balance Overall balance assessment: Needs assistance Sitting-balance support: Feet supported;No upper extremity supported Sitting balance-Leahy Scale: Good     Standing balance support: During functional activity;Single extremity supported Standing balance-Leahy Scale: Fair Standing balance comment: pt able to static stand without UE support                           ADL either performed or assessed with clinical judgement   ADL Overall ADL's : Needs assistance/impaired     Grooming: Wash/dry face;Oral care;Set up;Bed level   Upper Body Bathing: Minimal assistance;Sitting Upper Body Bathing Details (indicate cue type and reason): Min A for washing back     Upper Body Dressing : Minimal assistance;Sitting Upper Body Dressing Details (indicate cue type and reason): Min A for donning new gown. Lower Body Dressing: Maximal assistance;Bed level Lower Body Dressing Details (indicate cue type and reason): Max A for doffing/donning socks Toilet Transfer: Min guard;BSC;Stand-pivot;Requires wide/bariatric Toilet Transfer Details (indicate cue type and reason): Min Guard A for stand pivot to [facility] Toileting- Clothing Manipulation and Hygiene: Maximal assistance;Sit to/from stand [place]  Manipulation Details (indicate cue type and reason): Pt requiring Max A for peri care after BM     Functional mobility during ADLs: Min guard(stand pivot) General ADL Comments: Pt performing toileting and UB bathing while seated on BSC. Performed stand pivot to [facility] and back to bed with [employer]. VSS thorughout on 2L  O2     Vision       Perception     Praxis      Cognition Arousal/Alertness: Awake/alert Behavior During Therapy: WFL for tasks assessed/performed Overall Cognitive Status: Within Functional Limits for tasks assessed                                 General Comments: requires encouragement        Exercises Exercises: General Upper Extremity;General Lower Extremity General Exercises - Upper Extremity Shoulder Flexion: AROM;Left;10 reps Elbow Flexion: AROM;Both;10 reps Elbow Extension: AROM;Both;10 reps Wrist Flexion: AROM;Both;10 reps;Supine Wrist Extension: AROM;Both;10 reps;Supine Digit Composite Flexion: AROM;Both;10 reps;Supine Composite Extension: AROM;Both;10 reps General Exercises - Lower Extremity Ankle Circles/Pumps: AROM;Both;10 reps Hip Flexion/Marching: AAROM;Both;10 reps;Supine   Shoulder Instructions       General Comments VSS throughout on 2L O2. Increased redness, swelling, and heat from bilateral LEs at calves and shins. RN notified    Pertinent Vitals/ Pain       Pain Assessment: Faces Faces Pain Scale: Hurts even more Pain Location: BLEs Pain Descriptors / Indicators: Discomfort;Grimacing;Aching;Tender;Tightness Pain Intervention(s): Monitored during session;Limited activity within patient's tolerance;Repositioned  Home Living                                          Prior Functioning/Environment              Frequency  Min 3X/week        Progress Toward Goals  OT Goals(current goals can now be found in the care plan section)  Progress towards OT goals: Progressing toward goals  Acute Rehab OT Goals Patient Stated Goal: return home with family and home aides to assist OT Goal Formulation: With patient Time For Goal Achievement: 10/28/18 Potential to Achieve Goals: Good ADL Goals Pt Will Perform Grooming: with modified independence;sitting Pt Will Perform Upper Body Bathing: with  set-up;sitting Pt Will Perform Lower Body Bathing: sit to/from stand;with min assist;with adaptive equipment Pt Will Transfer to Toilet: with modified independence;bedside commode;ambulating Pt Will Perform Toileting - Clothing Manipulation and hygiene: with modified independence;sitting/lateral leans;sit to/from stand;with adaptive equipment Additional ADL Goal #1: Pt will independently verbalize 3 energy conservation strategies for ADL tasks  Plan Discharge plan remains appropriate    Co-evaluation                 AM-PAC OT "6 Clicks" Daily Activity     Outcome Measure   Help from another person eating meals?: A Little Help from another person taking care of personal grooming?: A Little Help from another person toileting, which includes using toliet, bedpan, or urinal?: A Lot Help from another person bathing (including washing, rinsing, drying)?: A Lot Help from another person to put on and taking off regular upper body clothing?: A Lot Help from another person to put on and taking off regular lower body clothing?: A Lot 6 Click Score: 14    End of Session Equipment Utilized During Treatment: Oxygen(2L)  OT Visit Diagnosis: Unsteadiness on  feet (R26.81);Other abnormalities of gait and mobility (R26.89);Pain;Muscle weakness (generalized) (M62.81) Pain - part of body: (general discomfort; back)   Activity Tolerance Patient tolerated treatment well   Patient Left in bed;with call bell/phone within reach;with nursing/sitter in room   Nurse Communication Mobility status        Time: 2010-0712 OT Time Calculation (min): 59 min  Charges: OT General Charges $OT Visit: 1 Visit OT Treatments $Self Care/Home Management : 38-52 mins $Therapeutic Exercise: 8-22 mins  Cason Luffman MSOT, OTR/L Acute Rehab Pager: [phone] Office: [place] 10/17/2018, 5:12 PM

## 2018-10-18 ENCOUNTER — Ambulatory Visit: Payer: Medicare Other

## 2018-10-18 ENCOUNTER — Inpatient Hospital Stay: Payer: Medicare Other

## 2018-10-18 ENCOUNTER — Inpatient Hospital Stay: Payer: Medicare Other | Admitting: Nurse Practitioner

## 2018-10-18 LAB — CBC
HCT: 24.2 % — ABNORMAL LOW (ref 36.0–46.0)
Hemoglobin: 7.5 g/dL — ABNORMAL LOW (ref 12.0–15.0)
MCH: 29.1 pg (ref 26.0–34.0)
MCHC: 31 g/dL (ref 30.0–36.0)
MCV: 93.8 fL (ref 80.0–100.0)
Platelets: 241 10*3/uL (ref 150–400)
RBC: 2.58 MIL/uL — ABNORMAL LOW (ref 3.87–5.11)
RDW: 23 % — ABNORMAL HIGH (ref 11.5–15.5)
WBC: 3 10*3/uL — ABNORMAL LOW (ref 4.0–10.5)
nRBC: 0 % (ref 0.0–0.2)

## 2018-10-18 LAB — COMPREHENSIVE METABOLIC PANEL
ALT: 8 U/L (ref 0–44)
AST: 15 U/L (ref 15–41)
Albumin: 2.6 g/dL — ABNORMAL LOW (ref 3.5–5.0)
Alkaline Phosphatase: 72 U/L (ref 38–126)
Anion gap: 8 (ref 5–15)
BUN: 34 mg/dL — ABNORMAL HIGH (ref 8–23)
CO2: 35 mmol/L — ABNORMAL HIGH (ref 22–32)
Calcium: 7.8 mg/dL — ABNORMAL LOW (ref 8.9–10.3)
Chloride: 95 mmol/L — ABNORMAL LOW (ref 98–111)
Creatinine, Ser: 1.29 mg/dL — ABNORMAL HIGH (ref 0.44–1.00)
GFR calc Af Amer: 49 mL/min — ABNORMAL LOW (ref >60)
GFR calc non Af Amer: 43 mL/min — ABNORMAL LOW (ref >60)
Glucose, Bld: 118 mg/dL — ABNORMAL HIGH (ref 70–99)
Potassium: 3.6 mmol/L (ref 3.5–5.1)
Sodium: 138 mmol/L (ref 135–145)
Total Bilirubin: 0.3 mg/dL (ref 0.3–1.2)
Total Protein: 5.5 g/dL — ABNORMAL LOW (ref 6.5–8.1)

## 2018-10-18 LAB — PHOSPHORUS: Phosphorus: 3.2 mg/dL (ref 2.5–4.6)

## 2018-10-18 LAB — GLUCOSE, CAPILLARY
Glucose-Capillary: 189 mg/dL — ABNORMAL HIGH (ref 70–99)
Glucose-Capillary: 216 mg/dL — ABNORMAL HIGH (ref 70–99)
Glucose-Capillary: 96 mg/dL (ref 70–99)
Glucose-Capillary: 110 mg/dL — ABNORMAL HIGH (ref 70–99)
Glucose-Capillary: 183 mg/dL — ABNORMAL HIGH (ref 70–99)
Glucose-Capillary: 231 mg/dL — ABNORMAL HIGH (ref 70–99)

## 2018-10-18 LAB — MAGNESIUM: Magnesium: 1.8 mg/dL (ref 1.7–2.4)

## 2018-10-18 MED ORDER — FUROSEMIDE 10 MG/ML IJ SOLN
20.0000 mg | Freq: Two times a day (BID) | INTRAMUSCULAR | Status: DC
  Administered 2018-10-18 – 2018-10-19 (×3): 20 mg via INTRAVENOUS
  Filled 2018-10-18 (×3): qty 2

## 2018-10-18 NOTE — Progress Notes (Signed)
[place] TEAM 1 - Stepdown/ICU TEAM  Stephanie Combs  ZOX:[id number] DOB: 01/05/1951 DOA: 10/11/2018 PCP: [name], MD    Brief Narrative:  [phone] w/ a hx of chronic diastolic CHF (EF 14%), small cell lung cancer and colon cancer on chemotherapy under the care of Dr. [name], COPD on 2L [place] chronically, and DM who presented to the [facility] with a 10 pound weight gain over a week. Incidentally her SARS-CoV-2 test returned positive but a CXR was unremarkable.   Significant Events: 6/1 admit via [place] direct admit   COVID-19 specific Treatment: None indicated   Subjective: The patient tells me she "feels terrible today."  She reports ongoing severe nausea.  She reports some diarrhea today as well.  She denies shortness of breath or abdominal pain.  She reports some subjective fevers.  She reports she has a very poor appetite.  Assessment & Plan:  Acute on chronic diastolic heart failure dry weight appears to be 143kg - TTE 06/2018 noted EF of 60-65% - net negative ~11.3L since admit - crt has risen a bit this morning - decrease diuretic dose and cont to follow   Filed Weights   10/15/18 0500 10/16/18 0500 10/17/18 0500  Weight: (!) 148.8 kg (!) 146.1 kg (!) 145.7 kg    COVID Gastroenteritis - Intractable Nausea Now becoming more worrisome for COVID gastroenteritis given diarrhea - cont to tx sx and follow clinically - intake remains very limited - reports allergies to reglan and phenergan  SARS-CoV-2 Positive No clinical evidence of COVID PNA at present - resp status stable   CKD stage III Baseline creatinine 1.5 - creatinine better than baseline s/p diuresis   Recent Labs  Lab 10/14/18 0410 10/15/18 0500 10/16/18 0230 10/17/18 0330 10/18/18 0540  CREATININE 1.30* 1.27* 1.14* 1.19* 1.29*     Anemia of chronic disease Secondary to kidney disease + CA - Hgb dropped below transfusion threshold 6/5 and patient received 1 unit PRBC - follow trend  intermittently   Recent Labs  Lab 10/14/18 0410 10/15/18 0500 10/16/18 0230 10/17/18 0330 10/18/18 0540  HGB 7.1* 6.8* 7.6* 7.8* 7.5*    DM No change in tx today - CBG controlled   Small cell lung CA (LUL) - R colon cancer (hepatic flexure) Not a candidate for surgery secondary to high risk - currently undergoing chemotherapy and XRT, which are presently on hold - to f/u w/ Oncology after d/c from [facility]   CAD Asymptomatic presently  Hyperlipidemia Hold Lipitor until oral intake more consistent  Hypertension Blood pressure reasonably controlled  COPD - Chronic hypoxic respiratory failure Stable at this time on minimal oxygen support - wean to RA as able   Hypothyroidism Continue Synthroid  Rheumatoid arthritis Continue usual home medications  Depression/anxiety Continue Xanax TID prn  Morbid obesity - Body mass index is 59.71 kg/m.   DVT prophylaxis: lovenox  Code Status: DNR - NO CODE Family Communication:  Disposition Plan: mobilize - PT/OT  Consultants:  Oncology (while at [facility])  Antimicrobials:  none   Objective: Blood pressure (!) 115/55, pulse 85, temperature 100.1 F (37.8 C), temperature source Oral, resp. rate 19, height 5' 1.5" (1.562 m), weight (!) 145.7 kg, SpO2 97 %.  Intake/Output Summary (Last 24 hours) at 10/18/2018 0752 Last data filed at 10/18/2018 0000 Gross per 24 hour  Intake 960 ml  Output 2200 ml  Net -1240 ml   Filed Weights   10/15/18 0500 10/16/18 0500 10/17/18 0500  Weight: Marland Kitchen)  148.8 kg (!) 146.1 kg (!) 145.7 kg    Examination: General: appears miserable - no resp distress Lungs: Distant breath sounds th/o - no wheezing  Cardiovascular: Distant heart sounds, RRR  Abdomen: Morbidly obese, soft, BS+, no rebound Extremities: 1+ B LE edema   CBC: Recent Labs  Lab 10/14/18 0410 10/15/18 0500 10/16/18 0230 10/17/18 0330 10/18/18 0540  WBC 4.0 3.4* 3.3* 3.1* 3.0*  NEUTROABS 2.6 2.2 2.3  --   --   HGB 7.1* 6.8* 7.6*  7.8* 7.5*  HCT 24.6* 22.3* 25.1* 25.3* 24.2*  MCV 97.6 94.5 94.7 94.4 93.8  PLT 362 318 319 294 409   Basic Metabolic Panel: Recent Labs  Lab 10/13/18 0538  10/16/18 0230 10/17/18 0330 10/18/18 0540  NA 138   < > 138 137 138  K 5.2*   < > 3.8 3.6 3.6  CL 105   < > 98 95* 95*  CO2 28   < > 32 33* 35*  GLUCOSE 298*   < > 200* 152* 118*  BUN 29*   < > 34* 33* 34*  CREATININE 1.37*   < > 1.14* 1.19* 1.29*  CALCIUM 8.4*   < > 7.8* 7.9* 7.8*  MG 1.9  --   --  1.8 1.8  PHOS  --   --   --  3.5 3.2   < > = values in this interval not displayed.   GFR: Estimated Creatinine Clearance: 57.8 mL/min (A) (by C-G formula based on SCr of 1.29 mg/dL (H)).  Liver Function Tests: Recent Labs  Lab 10/14/18 0410 10/15/18 0500 10/16/18 0230 10/18/18 0540  AST 13* 14* 18 15  ALT 11 10 10 8   ALKPHOS 92 76 77 72  BILITOT 0.3 0.4 0.3 0.3  PROT 5.7* 5.4* 5.7* 5.5*  ALBUMIN 3.0* 2.9* 2.9* 2.6*    HbA1C: Hemoglobin A1C  Date/Time Value Ref Range Status  11/11/2016 7.5  Final  08/09/2016 9.6  Final   Hgb A1c MFr Bld  Date/Time Value Ref Range Status  08/30/2018 09:15 PM 7.5 (H) 4.8 - 5.6 % Final    Comment:    (NOTE)         Prediabetes: 5.7 - 6.4         Diabetes: >6.4         Glycemic control for adults with diabetes: <7.0   07/05/2018 11:38 AM 7.9 (H) 4.8 - 5.6 % Final    Comment:    (NOTE) Pre diabetes:          5.7%-6.4% Diabetes:              >6.4% Glycemic control for   <7.0% adults with diabetes     CBG: Recent Labs  Lab 10/16/18 1139 10/16/18 1625 10/16/18 2043 10/17/18 0822 10/17/18 1122  GLUCAP 198* 179* 214* 92 125*    Recent Results (from the past 240 hour(s))  SARS Coronavirus 2 (CEPHEID- Performed in [place] hospital lab), Hosp Order     Status: Abnormal   Collection Time: 10/11/18  3:34 PM  Result Value Ref Range Status   SARS Coronavirus 2 POSITIVE (A) NEGATIVE Final    Comment: RESULT CALLED TO, READ BACK BY AND VERIFIED WITH: [place]  [id number] @ 7829 BY J SCOTTON (NOTE) If result is NEGATIVE SARS-CoV-2 target nucleic acids are NOT DETECTED. The SARS-CoV-2 RNA is generally detectable in upper and lower  respiratory specimens during the acute phase of infection. The lowest  concentration of SARS-CoV-2 viral copies this  assay can detect is 250  copies / mL. A negative result does not preclude SARS-CoV-2 infection  and should not be used as the sole basis for treatment or other  patient management decisions.  A negative result may occur with  improper specimen collection / handling, submission of specimen other  than nasopharyngeal swab, presence of viral mutation(s) within the  areas targeted by this assay, and inadequate number of viral copies  (<250 copies / mL). A negative result must be combined with clinical  observations, patient history, and epidemiological information. If result is POSITIVE SARS-CoV-2 target nucleic acids are DETEC TED. The SARS-CoV-2 RNA is generally detectable in upper and lower  respiratory specimens during the acute phase of infection.  Positive  results are indicative of active infection with SARS-CoV-2.  Clinical  correlation with patient history and other diagnostic information is  necessary to determine patient infection status.  Positive results do  not rule out bacterial infection or co-infection with other viruses. If result is PRESUMPTIVE POSTIVE SARS-CoV-2 nucleic acids MAY BE PRESENT.   A presumptive positive result was obtained on the submitted specimen  and confirmed on repeat testing.  While 2019 novel coronavirus  (SARS-CoV-2) nucleic acids may be present in the submitted sample  additional confirmatory testing may be necessary for epidemiological  and / or clinical management purposes  to differentiate between  SARS-CoV-2 and other Sarbecovirus currently known to infect humans.  If clinically indicated additional testing with an alternate test  methodology (LAB7 453) is  advised. The SARS-CoV-2 RNA is generally  detectable in upper and lower respiratory specimens during the acute  phase of infection. The expected result is Negative. Fact Sheet for Patients:  StrictlyIdeas.no Fact Sheet for Healthcare Providers: BankingDealers.co.za This test is not yet approved or cleared by the [place] FDA and has been authorized for detection and/or diagnosis of SARS-CoV-2 by FDA under an Emergency Use Authorization (EUA).  This EUA will remain in effect (meaning this test can be used) for the duration of the COVID-19 declaration under Section 564(b)(1) of the Act, 21 U.S.C. section 360bbb-3(b)(1), unless the authorization is terminated or revoked sooner. Performed at [facility], [place] [address] [place], [place] [address]      Scheduled Meds:  aspirin EC  81 mg Oral Daily   cholecalciferol  5,000 Units Oral Daily   docusate sodium  300 mg Oral Daily   enoxaparin (LOVENOX) injection  70 mg Subcutaneous Q24H   furosemide  80 mg Intravenous BID   gabapentin  600 mg Oral TID   Gerhardt's butt cream   Topical TID   insulin aspart  0-20 Units Subcutaneous TID WC   insulin aspart  0-5 Units Subcutaneous QHS   insulin detemir  48 Units Subcutaneous QHS   levothyroxine  200 mcg Oral Daily   nystatin   Topical TID   pantoprazole  40 mg Oral BID   polyethylene glycol  17 g Oral BID   senna  2 tablet Oral BID WC   topiramate  75 mg Oral QHS   umeclidinium-vilanterol  1 puff Inhalation Daily   vitamin C  500 mg Oral BID   zinc sulfate  220 mg Oral Daily     LOS: 7 days   Cherene Altes, MD [facility] Office  [phone] Pager - Text Page per Shea Evans  If 7PM-7AM, please contact night-coverage per Amion 10/18/2018, 7:52 AM

## 2018-10-18 NOTE — Consult Note (Addendum)
   [facility] CM Inpatient Consult   10/18/2018  Stephanie Combs Jun 02, 1950 [id number]  Referral noted on  10/15/2018 for [facility] post hospital follow up for complex disease and care management.    Patient with a HX with [facility] in the past.  Patient is in the [place] with [employer] of the [place]. Electronic medical record reveals patient per MD notes this morning as follows:  68yo w/ a hx of chronicdiastolic CHF (EF 65%), small cell lung cancer and colon cancer on chemotherapyunder the care of Dr. [name], COPD on 2L [place] chronically, and DM who presented to the [facility] with a 10 pound weight gain over a week. Incidentally her SARS-CoV-2 test returned positive but a CXR was unremarkable.    Patient will be followed by [place] Coordinator.  Will update [facility] team member on progress and disposition.   Natividad Brood, RN BSN [place] [facility] Liaison  [phone] business mobile phone Toll free office [phone]  Fax number: [phone] [email or web address]

## 2018-10-19 ENCOUNTER — Ambulatory Visit: Payer: Medicare Other

## 2018-10-19 LAB — GLUCOSE, CAPILLARY
Glucose-Capillary: 151 mg/dL — ABNORMAL HIGH (ref 70–99)
Glucose-Capillary: 216 mg/dL — ABNORMAL HIGH (ref 70–99)
Glucose-Capillary: 105 mg/dL — ABNORMAL HIGH (ref 70–99)
Glucose-Capillary: 174 mg/dL — ABNORMAL HIGH (ref 70–99)

## 2018-10-19 NOTE — Progress Notes (Signed)
Occupational Therapy Treatment Patient Details Name: Stephanie Combs MRN: [id number] DOB: Mar 11, 1951 Today's Date: 10/19/2018    History of present illness On 10/11/18, Stephanie Combs is a 68 y.o. female with medical history significant of diastolic heart failure, anxiety, COPD, diabetes mellitus, chronic respiratory failure on 2 L via nasal cannula, lung and colon cancer received chemo and radiation therapy.  Oncology referred pt for admission due to a 10 pound weight gain over the last week.  She is try to manage this with oral Lasix.  Reports some associated shortness of breath as well.  She has associated lower extremity swelling with weeping which is chronic. On admission she tested + COVID-19. per MD primarily acute on chronic CHF with COVID + an incidental finding. Transfer from [place] to [place].    OT comments  Upon arrival, pt sleeping and supine in bed. Pt continues to report BLE pain/swelling, nausea, and fatigue. Pt agreeable to participate in bed level exercises with theraband. Adapting exercises to fit right shoulder ROM limitations. Pt performing 15 reps of each exercises; using LUE to assist RUE as needed. Pt reporting she performed peri care twice since last OT session and would be agreeable to use toilet aide. Continue to recommend dc home with HHOT and will continue to follow acutely as admitted.    Follow Up Recommendations  Home health OT;Supervision/Assistance - 24 hour    Equipment Recommendations  None recommended by OT    Recommendations for Other Services      Precautions / Restrictions Precautions Precautions: Fall Restrictions Weight Bearing Restrictions: No       Mobility Bed Mobility                  Transfers                      Balance                                           ADL either performed or assessed with clinical judgement   ADL Overall ADL's : Needs assistance/impaired                                Toileting - Clothing Manipulation Details (indicate cue type and reason): Pt reporting she performed peri care twice after toileting. Agreeable to attempt use of toilet aide.        General ADL Comments: Pt reporting fatigue and nausea. Agreeable to exercises     Vision       Perception     Praxis      Cognition Arousal/Alertness: Awake/alert Behavior During Therapy: WFL for tasks assessed/performed Overall Cognitive Status: Within Functional Limits for tasks assessed                                 General Comments: Self limiting and requiring increased encouragement        Exercises Exercises: Other exercises;General Upper Extremity General Exercises - Upper Extremity Shoulder Horizontal ABduction: Strengthening;Both;15 reps;Supine;Theraband Theraband Level (Shoulder Horizontal Abduction): Level 1 (Yellow) Shoulder Horizontal ADduction: Strengthening;Left;15 reps;Supine;Theraband Theraband Level (Shoulder Horizontal Adduction): Level 1 (Yellow) Elbow Flexion: Left;15 reps;Supine;Strengthening;Theraband Theraband Level (Elbow Flexion): Level 1 (Yellow) Elbow Extension: Strengthening;Left;15 reps;Supine;Theraband Theraband Level (Elbow Extension): Level 1 (Yellow) Other  Exercises Other Exercises: Issued level one theraband. Adapting UE exercises that fit within Stephanie Combs's ROM limitations at her right shoulder. Using both hands to pull theraband across her body.   Shoulder Instructions       General Comments VSS throughout on 2L    Pertinent Vitals/ Pain       Pain Assessment: Faces Faces Pain Scale: Hurts little more Pain Location: BLEs Pain Descriptors / Indicators: Discomfort Pain Intervention(s): Monitored during session;Limited activity within patient's tolerance;Repositioned  Home Living                                          Prior Functioning/Environment              Frequency  Min 3X/week         Progress Toward Goals  OT Goals(current goals can now be found in the care plan section)  Progress towards OT goals: Progressing toward goals  Acute Rehab OT Goals Patient Stated Goal: return home with family and home aides to assist OT Goal Formulation: With patient Time For Goal Achievement: 10/28/18 Potential to Achieve Goals: Good ADL Goals Pt Will Perform Grooming: with modified independence;sitting Pt Will Perform Upper Body Bathing: with set-up;sitting Pt Will Perform Lower Body Bathing: sit to/from stand;with min assist;with adaptive equipment Pt Will Transfer to Toilet: with modified independence;bedside commode;ambulating Pt Will Perform Toileting - Clothing Manipulation and hygiene: with modified independence;sitting/lateral leans;sit to/from stand;with adaptive equipment Additional ADL Goal #1: Pt will independently verbalize 3 energy conservation strategies for ADL tasks  Plan Discharge plan remains appropriate    Co-evaluation                 AM-PAC OT "6 Clicks" Daily Activity     Outcome Measure   Help from another person eating meals?: A Little Help from another person taking care of personal grooming?: A Little Help from another person toileting, which includes using toliet, bedpan, or urinal?: A Lot Help from another person bathing (including washing, rinsing, drying)?: A Lot Help from another person to put on and taking off regular upper body clothing?: A Lot Help from another person to put on and taking off regular lower body clothing?: A Lot 6 Click Score: 14    End of Session Equipment Utilized During Treatment: Oxygen(2L)  OT Visit Diagnosis: Unsteadiness on feet (R26.81);Other abnormalities of gait and mobility (R26.89);Pain;Muscle weakness (generalized) (M62.81) Pain - part of body: (general discomfort; back)   Activity Tolerance Patient tolerated treatment well   Patient Left in bed;with call bell/phone within reach;with nursing/sitter in  room   Nurse Communication Mobility status        Time: 4656-8127 OT Time Calculation (min): 19 min  Charges: OT General Charges $OT Visit: 1 Visit OT Treatments $Therapeutic Exercise: 8-22 mins  [place], OTR/L Acute Rehab Pager: [phone] Office: [place] 10/19/2018, 4:41 PM

## 2018-10-19 NOTE — Progress Notes (Signed)
[place] TEAM 1 - Stepdown/ICU TEAM  Stephanie Combs  YKD:[id number] DOB: 1950/12/23 DOA: 10/11/2018 PCP: [name], MD    Brief Narrative:  [phone] w/ a hx of chronic diastolic CHF (EF 73%), small cell lung cancer and colon cancer on chemotherapy under the care of Dr. [name], COPD on 2L [place] chronically, and DM who presented to the [facility] with a 10 pound weight gain over a week. Incidentally her SARS-CoV-2 test returned positive but a CXR was unremarkable.   Significant Events: 6/1 admit via [place] direct admit    COVID-19 specific Treatment: None indicated   Subjective: Had fever to 102.8 last night. Sats remain stable. Remains quite miserable with nausea, but perhaps slightly better today. Encouraged pushing oral intake. No new complaints today.   Assessment & Plan:  Acute on chronic diastolic heart failure dry weight appears to be 143kg - currently below baseline weight - TTE 06/2018 noted EF of 60-65% - net negative ~11.7L since admit - crt has risen a bit this morning - cont to follow trend - stop diuretic for now in setting of ongoing poor intake w/ pt approaching euvolemia   Filed Weights   10/16/18 0500 10/17/18 0500 10/19/18 0451  Weight: (!) 146.1 kg (!) 145.7 kg (!) 140.9 kg    COVID Gastroenteritis - Intractable Nausea Now becoming more worrisome for COVID gastroenteritis given diarrhea - cont to tx sx and follow clinically - intake remains very limited - reports allergies to reglan and phenergan   SARS-CoV-2 Positive No clinical evidence of COVID PNA at present - resp status stable   CKD stage III Baseline creatinine 1.5 - creatinine better than baseline s/p diuresis - follow trend   Recent Labs  Lab 10/14/18 0410 10/15/18 0500 10/16/18 0230 10/17/18 0330 10/18/18 0540  CREATININE 1.30* 1.27* 1.14* 1.19* 1.29*     Anemia of chronic disease Secondary to kidney disease + CA - Hgb dropped below transfusion threshold 6/5 and patient  received 1 unit PRBC - follow trend intermittently - no evidence of bleeding at this time   Recent Labs  Lab 10/14/18 0410 10/15/18 0500 10/16/18 0230 10/17/18 0330 10/18/18 0540  HGB 7.1* 6.8* 7.6* 7.8* 7.5*    DM No change in tx today - CBG reasonably controlled   Small cell lung CA (LUL) - R colon cancer (hepatic flexure) Not a candidate for surgery secondary to high risk - currently undergoing chemotherapy and XRT, which are presently on hold - to f/u w/ Oncology after d/c from [facility]   CAD Asymptomatic presently  Hyperlipidemia Hold Lipitor until oral intake more consistent  Hypertension Blood pressure reasonably controlled  COPD - Chronic hypoxic respiratory failure Stable at this time on minimal oxygen support - wean to RA as able   Hypothyroidism Continue Synthroid  Rheumatoid arthritis Continue usual home medications  Depression/anxiety Continue Xanax TID prn  Morbid obesity - Body mass index is 57.74 kg/m.   DVT prophylaxis: lovenox  Code Status: DNR - NO CODE Family Communication:  Disposition Plan: mobilize - PT/OT  Consultants:  Oncology (while at [facility])  Antimicrobials:  none   Objective: Blood pressure (!) 128/49, pulse 85, temperature 100.2 F (37.9 C), temperature source Oral, resp. rate 18, height 5' 1.5" (1.562 m), weight (!) 140.9 kg, SpO2 97 %.  Intake/Output Summary (Last 24 hours) at 10/19/2018 0749 Last data filed at 10/19/2018 0600 Gross per 24 hour  Intake 340 ml  Output 700 ml  Net -360 ml  Filed Weights   10/16/18 0500 10/17/18 0500 10/19/18 0451  Weight: (!) 146.1 kg (!) 145.7 kg (!) 140.9 kg    Examination: General: still appears miserable - no resp distress Lungs: Distant breath sounds th/o w/o wheezing - mild bibasilar crackles  Cardiovascular: Distant heart sounds, RRR w/o rub  Abdomen: Morbidly obese, soft, BS+, no rebound Extremities: 1+ B LE edema w/o change   CBC: Recent Labs  Lab 10/14/18 0410 10/15/18  0500 10/16/18 0230 10/17/18 0330 10/18/18 0540  WBC 4.0 3.4* 3.3* 3.1* 3.0*  NEUTROABS 2.6 2.2 2.3  --   --   HGB 7.1* 6.8* 7.6* 7.8* 7.5*  HCT 24.6* 22.3* 25.1* 25.3* 24.2*  MCV 97.6 94.5 94.7 94.4 93.8  PLT 362 318 319 294 626   Basic Metabolic Panel: Recent Labs  Lab 10/13/18 0538  10/16/18 0230 10/17/18 0330 10/18/18 0540  NA 138   < > 138 137 138  K 5.2*   < > 3.8 3.6 3.6  CL 105   < > 98 95* 95*  CO2 28   < > 32 33* 35*  GLUCOSE 298*   < > 200* 152* 118*  BUN 29*   < > 34* 33* 34*  CREATININE 1.37*   < > 1.14* 1.19* 1.29*  CALCIUM 8.4*   < > 7.8* 7.9* 7.8*  MG 1.9  --   --  1.8 1.8  PHOS  --   --   --  3.5 3.2   < > = values in this interval not displayed.   GFR: Estimated Creatinine Clearance: 56.5 mL/min (A) (by C-G formula based on SCr of 1.29 mg/dL (H)).  Liver Function Tests: Recent Labs  Lab 10/14/18 0410 10/15/18 0500 10/16/18 0230 10/18/18 0540  AST 13* 14* 18 15  ALT 11 10 10 8   ALKPHOS 92 76 77 72  BILITOT 0.3 0.4 0.3 0.3  PROT 5.7* 5.4* 5.7* 5.5*  ALBUMIN 3.0* 2.9* 2.9* 2.6*    HbA1C: Hemoglobin A1C  Date/Time Value Ref Range Status  11/11/2016 7.5  Final  08/09/2016 9.6  Final   Hgb A1c MFr Bld  Date/Time Value Ref Range Status  08/30/2018 09:15 PM 7.5 (H) 4.8 - 5.6 % Final    Comment:    (NOTE)         Prediabetes: 5.7 - 6.4         Diabetes: >6.4         Glycemic control for adults with diabetes: <7.0   07/05/2018 11:38 AM 7.9 (H) 4.8 - 5.6 % Final    Comment:    (NOTE) Pre diabetes:          5.7%-6.4% Diabetes:              >6.4% Glycemic control for   <7.0% adults with diabetes     CBG: Recent Labs  Lab 10/18/18 0743 10/18/18 1214 10/18/18 1658 10/18/18 2205 10/19/18 0704  GLUCAP 96 110* 183* 231* 151*    Recent Results (from the past 240 hour(s))  SARS Coronavirus 2 (CEPHEID- Performed in [place] hospital lab), Hosp Order     Status: Abnormal   Collection Time: 10/11/18  3:34 PM  Result Value Ref Range  Status   SARS Coronavirus 2 POSITIVE (A) NEGATIVE Final    Comment: RESULT CALLED TO, READ BACK BY AND VERIFIED WITH: Watkins [id number] @ 2703 BY J SCOTTON (NOTE) If result is NEGATIVE SARS-CoV-2 target nucleic acids are NOT DETECTED. The SARS-CoV-2 RNA is generally detectable in  upper and lower  respiratory specimens during the acute phase of infection. The lowest  concentration of SARS-CoV-2 viral copies this assay can detect is 250  copies / mL. A negative result does not preclude SARS-CoV-2 infection  and should not be used as the sole basis for treatment or other  patient management decisions.  A negative result may occur with  improper specimen collection / handling, submission of specimen other  than nasopharyngeal swab, presence of viral mutation(s) within the  areas targeted by this assay, and inadequate number of viral copies  (<250 copies / mL). A negative result must be combined with clinical  observations, patient history, and epidemiological information. If result is POSITIVE SARS-CoV-2 target nucleic acids are DETEC TED. The SARS-CoV-2 RNA is generally detectable in upper and lower  respiratory specimens during the acute phase of infection.  Positive  results are indicative of active infection with SARS-CoV-2.  Clinical  correlation with patient history and other diagnostic information is  necessary to determine patient infection status.  Positive results do  not rule out bacterial infection or co-infection with other viruses. If result is PRESUMPTIVE POSTIVE SARS-CoV-2 nucleic acids MAY BE PRESENT.   A presumptive positive result was obtained on the submitted specimen  and confirmed on repeat testing.  While 2019 novel coronavirus  (SARS-CoV-2) nucleic acids may be present in the submitted sample  additional confirmatory testing may be necessary for epidemiological  and / or clinical management purposes  to differentiate between  SARS-CoV-2 and other  Sarbecovirus currently known to infect humans.  If clinically indicated additional testing with an alternate test  methodology (LAB7 453) is advised. The SARS-CoV-2 RNA is generally  detectable in upper and lower respiratory specimens during the acute  phase of infection. The expected result is Negative. Fact Sheet for Patients:  StrictlyIdeas.no Fact Sheet for Healthcare Providers: BankingDealers.co.za This test is not yet approved or cleared by the [place] FDA and has been authorized for detection and/or diagnosis of SARS-CoV-2 by FDA under an Emergency Use Authorization (EUA).  This EUA will remain in effect (meaning this test can be used) for the duration of the COVID-19 declaration under Section 564(b)(1) of the Act, 21 U.S.C. section 360bbb-3(b)(1), unless the authorization is terminated or revoked sooner. Performed at [facility], [place] [address] [place], [place] [address]      Scheduled Meds: . aspirin EC  81 mg Oral Daily  . cholecalciferol  5,000 Units Oral Daily  . docusate sodium  300 mg Oral Daily  . enoxaparin (LOVENOX) injection  70 mg Subcutaneous Q24H  . furosemide  20 mg Intravenous BID  . gabapentin  600 mg Oral TID  . Gerhardt's butt cream   Topical TID  . insulin aspart  0-20 Units Subcutaneous TID WC  . insulin aspart  0-5 Units Subcutaneous QHS  . insulin detemir  48 Units Subcutaneous QHS  . levothyroxine  200 mcg Oral Daily  . nystatin   Topical TID  . pantoprazole  40 mg Oral BID  . polyethylene glycol  17 g Oral BID  . senna  2 tablet Oral BID WC  . topiramate  75 mg Oral QHS  . umeclidinium-vilanterol  1 puff Inhalation Daily  . vitamin C  500 mg Oral BID  . zinc sulfate  220 mg Oral Daily     LOS: 7 days   Cherene Altes, MD [facility] Office  [phone] Pager - Text Page per Amion  If 7PM-7AM, please contact  night-coverage per Amion 10/19/2018, 7:49 AM

## 2018-10-19 NOTE — Progress Notes (Signed)
Physical Therapy Treatment Patient Details Name: Stephanie Combs MRN: [id number] DOB: 1950-10-17 Today's Date: 10/19/2018    History of Present Illness On 10/11/18, Stephanie Combs is a 69 y.o. female with medical history significant of diastolic heart failure, anxiety, COPD, diabetes mellitus, chronic respiratory failure on 2 L via nasal cannula, lung and colon cancer received chemo and radiation therapy.  Oncology referred pt for admission due to a 10 pound weight gain over the last week.  She is try to manage this with oral Lasix.  Reports some associated shortness of breath as well.  She has associated lower extremity swelling with weeping which is chronic. On admission she tested + COVID-19. per MD primarily acute on chronic CHF with COVID + an incidental finding. Transfer from [place] to [place].     PT Comments    Really feel pt is near her baseline; she is grossly unmotivated to mobilize and only participates with max encouragement; pt states she was getting HHPT prior to admission and is agreeable to continuing. Will follow  Follow Up Recommendations  Home health PT(continue HHPT --pt states she was getting HHPT prior)     Equipment Recommendations  None recommended by PT    Recommendations for Other Services       Precautions / Restrictions Precautions Precautions: Fall Restrictions Weight Bearing Restrictions: No    Mobility  Bed Mobility Overal bed mobility: Needs Assistance Bed Mobility: Supine to Sit     Supine to sit: Supervision     General bed mobility comments: requires incr time, no physical assist   Transfers Overall transfer level: Needs assistance Equipment used: None   Sit to Stand: Supervision Stand pivot transfers: Supervision       General transfer comment: for general safety  Ambulation/Gait             General Gait Details: pt declines to attempt in room amb with RW; she is able to stand for ~ 35sec for PT to arrange chair to her liking     Stairs             Wheelchair Mobility    Modified Rankin (Stroke Patients Only)       Balance     Sitting balance-Leahy Scale: Good     Standing balance support: During functional activity;Single extremity supported Standing balance-Leahy Scale: Fair Standing balance comment: pt able to static stand without UE support, able to perform pericare with unilateral UE support                             Cognition Arousal/Alertness: Awake/alert Behavior During Therapy: WFL for tasks assessed/performed Overall Cognitive Status: Within Functional Limits for tasks assessed                                 General Comments: requires max encouargement to mobilize/participate      Exercises      General Comments        Pertinent Vitals/Pain Pain Assessment: Faces Faces Pain Scale: Hurts little more Pain Location: BLEs Pain Descriptors / Indicators: Discomfort Pain Intervention(s): Limited activity within patient's tolerance;Monitored during session;Repositioned    Home Living                      Prior Function            PT Goals (current goals can now  be found in the care plan section) Acute Rehab PT Goals Patient Stated Goal: return home with family and home aides to assist PT Goal Formulation: With patient Time For Goal Achievement: 10/27/18 Potential to Achieve Goals: Fair Progress towards PT goals: Progressing toward goals    Frequency    Min 3X/week      PT Plan Frequency needs to be updated;Discharge plan needs to be updated    Co-evaluation              AM-PAC PT "6 Clicks" Mobility   Outcome Measure  Help needed turning from your back to your side while in a flat bed without using bedrails?: None Help needed moving from lying on your back to sitting on the side of a flat bed without using bedrails?: None Help needed moving to and from a bed to a chair (including a wheelchair)?: None Help needed  standing up from a chair using your arms (e.g., wheelchair or bedside chair)?: None Help needed to walk in hospital room?: A Little Help needed climbing 3-5 steps with a railing? : A Lot 6 Click Score: 21    End of Session Equipment Utilized During Treatment: Oxygen Activity Tolerance: Patient tolerated treatment well;Other (comment)(self limiting ) Patient left: in chair;with call bell/phone within reach;with nursing/sitter in room Nurse Communication: Mobility status PT Visit Diagnosis: Other abnormalities of gait and mobility (R26.89);Muscle weakness (generalized) (M62.81)     Time: 1638-4665 PT Time Calculation (min) (ACUTE ONLY): 18 min  Charges:  $Therapeutic Activity: 8-22 mins                     Kenyon Ana, PT  Pager: [phone] Acute Rehab Dept [facility]): [phone]   10/19/2018    [facility] 10/19/2018, 10:54 AM

## 2018-10-20 ENCOUNTER — Ambulatory Visit: Payer: Medicare Other

## 2018-10-20 LAB — GLUCOSE, CAPILLARY
Glucose-Capillary: 163 mg/dL — ABNORMAL HIGH (ref 70–99)
Glucose-Capillary: 158 mg/dL — ABNORMAL HIGH (ref 70–99)
Glucose-Capillary: 102 mg/dL — ABNORMAL HIGH (ref 70–99)
Glucose-Capillary: 120 mg/dL — ABNORMAL HIGH (ref 70–99)

## 2018-10-20 LAB — CBC WITH DIFFERENTIAL/PLATELET
Abs Immature Granulocytes: 0.01 10*3/uL (ref 0.00–0.07)
Basophils Absolute: 0 10*3/uL (ref 0.0–0.1)
Basophils Relative: 0 %
Eosinophils Absolute: 0 10*3/uL (ref 0.0–0.5)
Eosinophils Relative: 0 %
HCT: 22.8 % — ABNORMAL LOW (ref 36.0–46.0)
Hemoglobin: 7 g/dL — ABNORMAL LOW (ref 12.0–15.0)
Immature Granulocytes: 0 %
Lymphocytes Relative: 32 %
Lymphs Abs: 0.7 10*3/uL (ref 0.7–4.0)
MCH: 29 pg (ref 26.0–34.0)
MCHC: 30.7 g/dL (ref 30.0–36.0)
MCV: 94.6 fL (ref 80.0–100.0)
Monocytes Absolute: 0.3 10*3/uL (ref 0.1–1.0)
Monocytes Relative: 11 %
Neutro Abs: 1.3 10*3/uL — ABNORMAL LOW (ref 1.7–7.7)
Neutrophils Relative %: 57 %
Platelets: 159 10*3/uL (ref 150–400)
RBC: 2.41 MIL/uL — ABNORMAL LOW (ref 3.87–5.11)
RDW: 22.5 % — ABNORMAL HIGH (ref 11.5–15.5)
WBC: 2.3 10*3/uL — ABNORMAL LOW (ref 4.0–10.5)
nRBC: 0 % (ref 0.0–0.2)

## 2018-10-20 LAB — COMPREHENSIVE METABOLIC PANEL
ALT: 10 U/L (ref 0–44)
AST: 16 U/L (ref 15–41)
Albumin: 2.5 g/dL — ABNORMAL LOW (ref 3.5–5.0)
Alkaline Phosphatase: 65 U/L (ref 38–126)
Anion gap: 8 (ref 5–15)
BUN: 33 mg/dL — ABNORMAL HIGH (ref 8–23)
CO2: 34 mmol/L — ABNORMAL HIGH (ref 22–32)
Calcium: 7.8 mg/dL — ABNORMAL LOW (ref 8.9–10.3)
Chloride: 96 mmol/L — ABNORMAL LOW (ref 98–111)
Creatinine, Ser: 1.26 mg/dL — ABNORMAL HIGH (ref 0.44–1.00)
GFR calc Af Amer: 51 mL/min — ABNORMAL LOW (ref >60)
GFR calc non Af Amer: 44 mL/min — ABNORMAL LOW (ref >60)
Glucose, Bld: 178 mg/dL — ABNORMAL HIGH (ref 70–99)
Potassium: 3.6 mmol/L (ref 3.5–5.1)
Sodium: 138 mmol/L (ref 135–145)
Total Bilirubin: 0.2 mg/dL — ABNORMAL LOW (ref 0.3–1.2)
Total Protein: 5.5 g/dL — ABNORMAL LOW (ref 6.5–8.1)

## 2018-10-20 LAB — D-DIMER, QUANTITATIVE: D-Dimer, Quant: 0.79 ug/mL-FEU — ABNORMAL HIGH (ref 0.00–0.50)

## 2018-10-20 LAB — FERRITIN: Ferritin: 758 ng/mL — ABNORMAL HIGH (ref 11–307)

## 2018-10-20 LAB — C-REACTIVE PROTEIN: CRP: 5.6 mg/dL — ABNORMAL HIGH (ref <1.0)

## 2018-10-20 MED ORDER — FUROSEMIDE 10 MG/ML IJ SOLN
40.0000 mg | Freq: Every day | INTRAMUSCULAR | Status: DC
  Administered 2018-10-20 – 2018-10-26 (×7): 40 mg via INTRAVENOUS
  Filled 2018-10-20 (×7): qty 4

## 2018-10-20 MED ORDER — DIPHENHYDRAMINE-ZINC ACETATE 2-0.1 % EX CREA
TOPICAL_CREAM | Freq: Two times a day (BID) | CUTANEOUS | Status: DC | PRN
  Administered 2018-10-20 – 2018-10-30 (×2): 1 via TOPICAL
  Filled 2018-10-20: qty 28

## 2018-10-20 NOTE — Progress Notes (Signed)
PHARMACY CONSULT: Lovenox for VTE prophylaxis COVID protocol  Wt: 140.0kg BMI:  57.7 Scr:  1.19>1.26 CrCl >30 ml/hr  Hgb: 7 s/p PRBC 6/5 Pltc down to 159 D-dimer: 0.79  A/P:  Due to BMI > 35, continue weight-adjusted lovenox 0.5mg /kg sq q24h  Monitor weight, CBC and renal function, adjust as needed  Albertina Parr, PharmD., BCPS Clinical Pharmacist Clinical phone for 10/20/18 until 5pm: [phone]

## 2018-10-20 NOTE — Progress Notes (Signed)
PROGRESS NOTE  Stephanie Combs PPI:[id number] DOB: 1951-01-29 DOA: 10/11/2018 PCP: [name], MD   LOS: 8 days   Brief Narrative / Interim history: 68 year old female with history of chronic diastolic CHF, EF 06%, small cell lung cancer as well as colon cancer currently on chemotherapy under the care of Dr. Burr Medico, anxiety, COPD on 2 L nasal cannula at baseline, diabetes mellitus who came in to [facility] long ED on 10/11/2018 with 10 pound weight gain, fluid overload, as well as subjective shortness of breath.  She tested positive for SARS-CoV2.  Chest x-ray on admission without any evidence of multifocal pneumonia or consolidation.  It did show mild vascular congestion with cardiomegaly  Subjective: Does not feel too good today, continues to have nausea, poor p.o. intake and abdominal cramping every time she tries to eat.  Complains of shortness of breath but to some extent feels at baseline.  Febrile again last night to 102.  Assessment & Plan: Principal Problem:   Acute on chronic diastolic CHF (congestive heart failure) (HCC) Active Problems:   COPD (chronic obstructive pulmonary disease) (HCC)   Morbid obesity (HCC)   DM type 2 (diabetes mellitus, type 2) (HCC)   CKD (chronic kidney disease), stage III (HCC)   Hypothyroidism   Anemia in CKD (chronic kidney disease)   Anxiety   Chronic respiratory failure with hypoxia (HCC)   CAD (coronary artery disease)   Chronic pain   Small cell lung cancer, left upper lobe (HCC)   Acute on chronic diastolic heart failure (HCC)   Principal Problem Acute on chronic diastolic heart failure with chronic hypoxic respiratory failure -Patient was placed on IV Lasix which was held on 10/19/2018 due to slight bump in creatinine. -Net -12.6 L since admission, weight 331 on admission, 310 today.  She is still fluid overloaded, resume Lasix today  Filed Weights   10/16/18 0500 10/17/18 0500 10/19/18 0451  Weight: (!) 146.1 kg (!) 145.7 kg (!) 140.9 kg    Active Problems Covid gastroenteritis -Patient with intractable nausea, as well as abdominal discomfort when she eats.   -P.o. intake remains limited -Supportive treatment with antiemetics, she appears to be allergic to Reglan and Phenergan -Also possibly contributing recent chemotherapy  COVID-19 Labs  Recent Labs    10/20/18 0511  DDIMER 0.79*  FERRITIN 758*  CRP 5.6*    Lab Results  Component Value Date   SARSCOV2NAA POSITIVE (A) 10/11/2018     SARS-CoV-2 positive -No clinical evidence of pneumonia, her respiratory status is stable, she is on 2 L nasal cannula which is at her baseline  Chronic kidney disease stage III -Creatinine varies between 1.2-1.5, currently appears close to baseline.  She still has significant lower extremity swelling, diuresed extremely well with IV Lasix, add back IV Lasix, kidney function is stable  Lab Results  Component Value Date   CREATININE 1.26 (H) 10/20/2018   CREATININE 1.29 (H) 10/18/2018   CREATININE 1.19 (H) 10/17/2018   Anemia of chronic disease -Likely multifactorial in the setting of underlying malignancies as well as chronic kidney disease.  She did receive a unit of packed red blood cells for hemoglobin of less than 7 on 6/5.  Hemoglobin 7.0 this morning, no evidence of bleeding, will monitor and if it less than 7 by tomorrow will need another unit  Type 2 diabetes mellitus -CBGs well controlled, continue current regimen with detemir as well as sliding scale  Small cell lung cancer/colon cancer -Followed by Dr. Burr Medico as an outpatient colon cancer  was initially diagnosed in April 2020 and and lung cancer was found in May 2020 during her work-up for colon cancer.  Currently on chemotherapy, his most recent cycle #2 on 10/11/2018  Coronary artery disease /hyperlipidemia -No chest pain, this appears to be asymptomatic  Hypertension -Blood pressure controlled  COPD with chronic hypoxic respiratory failure -Stable on 2 L, she is  on chronic oxygen  Hypothyroidism -Continue Synthroid  Rheumatoid arthritis -Continue home medications  Depression/anxiety -Continue home medications  Morbid obesity -BMI 57    Scheduled Meds: . aspirin EC  81 mg Oral Daily  . cholecalciferol  5,000 Units Oral Daily  . docusate sodium  300 mg Oral Daily  . enoxaparin (LOVENOX) injection  70 mg Subcutaneous Q24H  . gabapentin  600 mg Oral TID  . Gerhardt's butt cream   Topical TID  . insulin aspart  0-20 Units Subcutaneous TID WC  . insulin aspart  0-5 Units Subcutaneous QHS  . insulin detemir  48 Units Subcutaneous QHS  . levothyroxine  200 mcg Oral Daily  . nystatin   Topical TID  . pantoprazole  40 mg Oral BID  . polyethylene glycol  17 g Oral BID  . senna  2 tablet Oral BID WC  . topiramate  75 mg Oral QHS  . umeclidinium-vilanterol  1 puff Inhalation Daily  . vitamin C  500 mg Oral BID  . zinc sulfate  220 mg Oral Daily   Continuous Infusions: PRN Meds:.acetaminophen, ALPRAZolam, diphenhydrAMINE-zinc acetate, ondansetron (ZOFRAN) IV, oxyCODONE-acetaminophen **AND** oxyCODONE, tiZANidine  DVT prophylaxis: Lovenox Code Status: DNR Family Communication: d/w patient in the room Disposition Plan: home when ready  Consultants:   None   Procedures:   None   Antimicrobials:  None    Objective: Vitals:   10/19/18 1947 10/20/18 0232 10/20/18 0425 10/20/18 0740  BP: (!) 116/43  (!) 120/50 (!) 125/45  Pulse:    80  Resp:   20 16  Temp: 100.2 F (37.9 C) (!) 102 F (38.9 C) (!) 101.1 F (38.4 C) 99.3 F (37.4 C)  TempSrc: Oral  Oral Oral  SpO2:   95% 96%  Weight:      Height:        Intake/Output Summary (Last 24 hours) at 10/20/2018 1121 Last data filed at 10/20/2018 0700 Gross per 24 hour  Intake 340 ml  Output 1300 ml  Net -960 ml   Filed Weights   10/16/18 0500 10/17/18 0500 10/19/18 0451  Weight: (!) 146.1 kg (!) 145.7 kg (!) 140.9 kg    Examination:  Constitutional: NAD, hair loss  evident Eyes: No scleral icterus ENMT: Mucous membranes are moist.   Neck: normal, supple Respiratory: clear to auscultation bilaterally, no wheezing, faint bibasilar crackles. Normal respiratory effort.  Overall diminished breath sounds.  Cardiovascular: Regular rate and rhythm, no murmurs / rubs / gallops.  1+ pitting LE edema. 2+ pedal pulses.  Abdomen: Mild discomfort on epigastric palpation, no guarding or rebound.  Bowel sounds positive Musculoskeletal: no clubbing / cyanosis.  Skin: Mild erythema bilateral lower extremities, early chronic venous stasis changes Neurologic: CN 2-12 grossly intact. Strength 5/5 in all 4.  Psychiatric: Normal judgment and insight. Alert and oriented x 3. Normal mood.    Data Reviewed: I have independently reviewed following labs and imaging studies   CBC: Recent Labs  Lab 10/14/18 0410 10/15/18 0500 10/16/18 0230 10/17/18 0330 10/18/18 0540 10/20/18 0511  WBC 4.0 3.4* 3.3* 3.1* 3.0* 2.3*  NEUTROABS 2.6 2.2 2.3  --   --  1.3*  HGB 7.1* 6.8* 7.6* 7.8* 7.5* 7.0*  HCT 24.6* 22.3* 25.1* 25.3* 24.2* 22.8*  MCV 97.6 94.5 94.7 94.4 93.8 94.6  PLT 362 318 319 294 241 793   Basic Metabolic Panel: Recent Labs  Lab 10/15/18 0500 10/16/18 0230 10/17/18 0330 10/18/18 0540 10/20/18 0511  NA 137 138 137 138 138  K 3.8 3.8 3.6 3.6 3.6  CL 100 98 95* 95* 96*  CO2 31 32 33* 35* 34*  GLUCOSE 181* 200* 152* 118* 178*  BUN 32* 34* 33* 34* 33*  CREATININE 1.27* 1.14* 1.19* 1.29* 1.26*  CALCIUM 7.7* 7.8* 7.9* 7.8* 7.8*  MG  --   --  1.8 1.8  --   PHOS  --   --  3.5 3.2  --    GFR: Estimated Creatinine Clearance: 57.9 mL/min (A) (by C-G formula based on SCr of 1.26 mg/dL (H)). Liver Function Tests: Recent Labs  Lab 10/14/18 0410 10/15/18 0500 10/16/18 0230 10/18/18 0540 10/20/18 0511  AST 13* 14* 18 15 16   ALT 11 10 10 8 10   ALKPHOS 92 76 77 72 65  BILITOT 0.3 0.4 0.3 0.3 0.2*  PROT 5.7* 5.4* 5.7* 5.5* 5.5*  ALBUMIN 3.0* 2.9* 2.9* 2.6* 2.5*    No results for input(s): LIPASE, AMYLASE in the last 168 hours. No results for input(s): AMMONIA in the last 168 hours. Coagulation Profile: No results for input(s): INR, PROTIME in the last 168 hours. Cardiac Enzymes: No results for input(s): CKTOTAL, CKMB, CKMBINDEX, TROPONINI in the last 168 hours. BNP (last 3 results) No results for input(s): PROBNP in the last 8760 hours. HbA1C: No results for input(s): HGBA1C in the last 72 hours. CBG: Recent Labs  Lab 10/19/18 0704 10/19/18 1141 10/19/18 1659 10/19/18 2106 10/20/18 0738  GLUCAP 151* 216* 105* 174* 163*   Lipid Profile: No results for input(s): CHOL, HDL, LDLCALC, TRIG, CHOLHDL, LDLDIRECT in the last 72 hours. Thyroid Function Tests: No results for input(s): TSH, T4TOTAL, FREET4, T3FREE, THYROIDAB in the last 72 hours. Anemia Panel: Recent Labs    10/20/18 0511  FERRITIN 758*   Urine analysis:    Component Value Date/Time   COLORURINE YELLOW 04/05/2018 1559   APPEARANCEUR HAZY (A) 04/05/2018 1559   LABSPEC >1.030 (H) 04/05/2018 1559   PHURINE 5.5 04/05/2018 1559   GLUCOSEU NEGATIVE 04/05/2018 1559   HGBUR SMALL (A) 04/05/2018 1559   BILIRUBINUR SMALL (A) 04/05/2018 1559   KETONESUR 15 (A) 04/05/2018 1559   PROTEINUR 100 (A) 04/05/2018 1559   UROBILINOGEN 0.2 12/04/2014 0650   NITRITE NEGATIVE 04/05/2018 1559   LEUKOCYTESUR SMALL (A) 04/05/2018 1559   Sepsis Labs: Invalid input(s): PROCALCITONIN, LACTICIDVEN  Recent Results (from the past 240 hour(s))  SARS Coronavirus 2 (CEPHEID- Performed in [place] hospital lab), Hosp Order     Status: Abnormal   Collection Time: 10/11/18  3:34 PM  Result Value Ref Range Status   SARS Coronavirus 2 POSITIVE (A) NEGATIVE Final    Comment: RESULT CALLED TO, READ BACK BY AND VERIFIED WITH: [facility] [id number] @ 2330 BY J SCOTTON (NOTE) If result is NEGATIVE SARS-CoV-2 target nucleic acids are NOT DETECTED. The SARS-CoV-2 RNA is generally detectable in upper  and lower  respiratory specimens during the acute phase of infection. The lowest  concentration of SARS-CoV-2 viral copies this assay can detect is 250  copies / mL. A negative result does not preclude SARS-CoV-2 infection  and should not be used as the sole basis for treatment or other  patient management decisions.  A negative result may occur with  improper specimen collection / handling, submission of specimen other  than nasopharyngeal swab, presence of viral mutation(s) within the  areas targeted by this assay, and inadequate number of viral copies  (<250 copies / mL). A negative result must be combined with clinical  observations, patient history, and epidemiological information. If result is POSITIVE SARS-CoV-2 target nucleic acids are DETEC TED. The SARS-CoV-2 RNA is generally detectable in upper and lower  respiratory specimens during the acute phase of infection.  Positive  results are indicative of active infection with SARS-CoV-2.  Clinical  correlation with patient history and other diagnostic information is  necessary to determine patient infection status.  Positive results do  not rule out bacterial infection or co-infection with other viruses. If result is PRESUMPTIVE POSTIVE SARS-CoV-2 nucleic acids MAY BE PRESENT.   A presumptive positive result was obtained on the submitted specimen  and confirmed on repeat testing.  While 2019 novel coronavirus  (SARS-CoV-2) nucleic acids may be present in the submitted sample  additional confirmatory testing may be necessary for epidemiological  and / or clinical management purposes  to differentiate between  SARS-CoV-2 and other Sarbecovirus currently known to infect humans.  If clinically indicated additional testing with an alternate test  methodology ([id number] [id number]) is advised. The SARS-CoV-2 RNA is generally  detectable in upper and lower respiratory specimens during the acute  phase of infection. The expected result is  Negative. Fact Sheet for Patients:  StrictlyIdeas.no Fact Sheet for Healthcare Providers: BankingDealers.co.za This test is not yet approved or cleared by the [place] FDA and has been authorized for detection and/or diagnosis of SARS-CoV-2 by FDA under an Emergency Use Authorization (EUA).  This EUA will remain in effect (meaning this test can be used) for the duration of the COVID-19 declaration under Section 564(b)(1) of the Act, 21 U.S.C. section 360bbb-3(b)(1), unless the authorization is terminated or revoked sooner. Performed at [facility], [place] [address], [place],  [address]       Radiology Studies: No results found.   Marzetta Board, MD, PhD [facility]  Contact via  [email or web address]  [place] P: [phone]  F: [phone]

## 2018-10-21 ENCOUNTER — Ambulatory Visit: Payer: Medicare Other

## 2018-10-21 DIAGNOSIS — U071 COVID-19: Secondary | ICD-10-CM

## 2018-10-21 DIAGNOSIS — C3490 Malignant neoplasm of unspecified part of unspecified bronchus or lung: Secondary | ICD-10-CM

## 2018-10-21 LAB — COMPREHENSIVE METABOLIC PANEL
ALT: 10 U/L (ref 0–44)
AST: 19 U/L (ref 15–41)
Albumin: 2.7 g/dL — ABNORMAL LOW (ref 3.5–5.0)
Alkaline Phosphatase: 62 U/L (ref 38–126)
Anion gap: 9 (ref 5–15)
BUN: 27 mg/dL — ABNORMAL HIGH (ref 8–23)
CO2: 32 mmol/L (ref 22–32)
Calcium: 7.9 mg/dL — ABNORMAL LOW (ref 8.9–10.3)
Chloride: 98 mmol/L (ref 98–111)
Creatinine, Ser: 1.06 mg/dL — ABNORMAL HIGH (ref 0.44–1.00)
GFR calc Af Amer: >60 mL/min (ref >60)
GFR calc non Af Amer: 54 mL/min — ABNORMAL LOW (ref >60)
Glucose, Bld: 95 mg/dL (ref 70–99)
Potassium: 3.5 mmol/L (ref 3.5–5.1)
Sodium: 139 mmol/L (ref 135–145)
Total Bilirubin: 0.4 mg/dL (ref 0.3–1.2)
Total Protein: 5.6 g/dL — ABNORMAL LOW (ref 6.5–8.1)

## 2018-10-21 LAB — CBC
HCT: 23.6 % — ABNORMAL LOW (ref 36.0–46.0)
Hemoglobin: 7.2 g/dL — ABNORMAL LOW (ref 12.0–15.0)
MCH: 28.8 pg (ref 26.0–34.0)
MCHC: 30.5 g/dL (ref 30.0–36.0)
MCV: 94.4 fL (ref 80.0–100.0)
Platelets: 132 10*3/uL — ABNORMAL LOW (ref 150–400)
RBC: 2.5 MIL/uL — ABNORMAL LOW (ref 3.87–5.11)
RDW: 22.6 % — ABNORMAL HIGH (ref 11.5–15.5)
WBC: 2.2 10*3/uL — ABNORMAL LOW (ref 4.0–10.5)
nRBC: 0 % (ref 0.0–0.2)

## 2018-10-21 LAB — D-DIMER, QUANTITATIVE: D-Dimer, Quant: 0.89 ug/mL-FEU — ABNORMAL HIGH (ref 0.00–0.50)

## 2018-10-21 LAB — LACTATE DEHYDROGENASE: LDH: 273 U/L — ABNORMAL HIGH (ref 98–192)

## 2018-10-21 LAB — C-REACTIVE PROTEIN: CRP: 6.9 mg/dL — ABNORMAL HIGH (ref <1.0)

## 2018-10-21 LAB — GLUCOSE, CAPILLARY
Glucose-Capillary: 68 mg/dL — ABNORMAL LOW (ref 70–99)
Glucose-Capillary: 127 mg/dL — ABNORMAL HIGH (ref 70–99)
Glucose-Capillary: 95 mg/dL (ref 70–99)
Glucose-Capillary: 154 mg/dL — ABNORMAL HIGH (ref 70–99)

## 2018-10-21 LAB — FERRITIN: Ferritin: 980 ng/mL — ABNORMAL HIGH (ref 11–307)

## 2018-10-21 MED ORDER — INSULIN DETEMIR 100 UNIT/ML ~~LOC~~ SOLN
30.0000 [IU] | Freq: Every day | SUBCUTANEOUS | Status: DC
  Administered 2018-10-21: 30 [IU] via SUBCUTANEOUS
  Filled 2018-10-21 (×2): qty 0.3

## 2018-10-21 NOTE — Progress Notes (Signed)
PROGRESS NOTE  Stephanie Combs JEH:[id number] DOB: 1950/05/23 DOA: 10/11/2018 PCP: [name], MD   LOS: 9 days   Brief Narrative / Interim history: 68 year old female with history of chronic diastolic CHF, EF 37%, small cell lung cancer as well as colon cancer currently on chemotherapy under the care of Dr. Burr Medico, anxiety, COPD on 2 L nasal cannula at baseline, diabetes mellitus who came in to [facility] long ED on 10/11/2018 with 10 pound weight gain, fluid overload, as well as subjective shortness of breath.  She tested positive for SARS-CoV2.  Chest x-ray on admission without any evidence of multifocal pneumonia or consolidation.  It did show mild vascular congestion with cardiomegaly  Subjective: Continues to feel weak, fatigued, and has abdominal cramping every time she tries to eat.  Has persistent nausea and poor p.o. intake.  Denies any vomiting.  No diarrhea. Febrile to 101.6 this morning  Assessment & Plan: Principal Problem:   Acute on chronic diastolic CHF (congestive heart failure) (HCC) Active Problems:   COPD (chronic obstructive pulmonary disease) (HCC)   Morbid obesity (HCC)   DM type 2 (diabetes mellitus, type 2) (HCC)   CKD (chronic kidney disease), stage III (HCC)   Hypothyroidism   Anemia in CKD (chronic kidney disease)   Anxiety   Chronic respiratory failure with hypoxia (HCC)   CAD (coronary artery disease)   Chronic pain   Small cell lung cancer, left upper lobe (HCC)   Acute on chronic diastolic heart failure (HCC)   Principal Problem Acute on chronic diastolic heart failure with chronic hypoxic respiratory failure -Patient was placed on IV Lasix which was held on 10/19/2018 due to slight bump in creatinine. -Net -12.6 L since admission, weight 331 on admission, 310 today.  She is still fluid overloaded, resume Lasix today  Filed Weights   10/16/18 0500 10/17/18 0500 10/19/18 0451  Weight: (!) 146.1 kg (!) 145.7 kg (!) 140.9 kg   Active Problems Covid  gastroenteritis -Patient with intractable nausea, as well as abdominal discomfort when she eats.   -P.o. intake remains limited -Supportive treatment with antiemetics, she appears to be allergic to Reglan and Phenergan -Also possibly contributing recent chemotherapy  COVID-19 Labs  Recent Labs    10/20/18 0511 10/21/18 0300  DDIMER 0.79* 0.89*  FERRITIN 758*  --   LDH  --  273*  CRP 5.6*  --     Lab Results  Component Value Date   SARSCOV2NAA POSITIVE (A) 10/11/2018     SARS-CoV-2 positive -No clinical evidence of pneumonia, her respiratory status is stable, she is on 2 L nasal cannula which is at her baseline -Inflammatory markers slightly increased today  Pancytopenia / Anemia of chronic disease / Thrombocytopenia -Likely multifactorial in the setting of underlying malignancies as well as chronic kidney disease.  She did receive a unit of packed red blood cells for hemoglobin of less than 7 on 6/5.  Hemoglobin 7.0 this morning, no evidence of bleeding, will monitor and if it less than 7 by tomorrow will need another unit -Also contributing was recent chemotherapy on 6/1 -Monitor for neutropenia, most recent [place] 1.3K, discussed with primary oncologist Dr. Burr Medico, will culture given fever as well as developing neutropenia, will start Granix if [place] less than 1K  Chronic kidney disease stage III -Creatinine varies between 1.2-1.5, currently appears close to baseline at 1.06 this morning -continue Lasix  Lab Results  Component Value Date   CREATININE 1.06 (H) 10/21/2018   CREATININE 1.26 (H) 10/20/2018  CREATININE 1.29 (H) 10/18/2018    Type 2 diabetes mellitus -had an episode of hypoglycemia this morning, will decrease the Levemir  Small cell lung cancer/colon cancer -Followed by Dr. Burr Medico as an outpatient colon cancer was initially diagnosed in April 2020 and and lung cancer was found in May 2020 during her work-up for colon cancer.  Currently on chemotherapy, his most  recent cycle #2 on 10/11/2018 -Monitor for neutropenic fever  Coronary artery disease /hyperlipidemia -No chest pain, this appears to be asymptomatic  Hypertension -Blood pressure controlled  COPD with chronic hypoxic respiratory failure -Stable on 2 L, she is on chronic oxygen at home.  No new respiratory issues in the last 24 hours  Hypothyroidism -Continue Synthroid  Rheumatoid arthritis -Continue home medications  Depression/anxiety -Continue home medications  Morbid obesity -BMI 57    Scheduled Meds: . aspirin EC  81 mg Oral Daily  . cholecalciferol  5,000 Units Oral Daily  . docusate sodium  300 mg Oral Daily  . enoxaparin (LOVENOX) injection  70 mg Subcutaneous Q24H  . furosemide  40 mg Intravenous Daily  . gabapentin  600 mg Oral TID  . Gerhardt's butt cream   Topical TID  . insulin aspart  0-20 Units Subcutaneous TID WC  . insulin aspart  0-5 Units Subcutaneous QHS  . insulin detemir  48 Units Subcutaneous QHS  . levothyroxine  200 mcg Oral Daily  . nystatin   Topical TID  . pantoprazole  40 mg Oral BID  . polyethylene glycol  17 g Oral BID  . senna  2 tablet Oral BID WC  . topiramate  75 mg Oral QHS  . umeclidinium-vilanterol  1 puff Inhalation Daily  . vitamin C  500 mg Oral BID  . zinc sulfate  220 mg Oral Daily   Continuous Infusions: PRN Meds:.acetaminophen, ALPRAZolam, diphenhydrAMINE-zinc acetate, ondansetron (ZOFRAN) IV, oxyCODONE-acetaminophen **AND** oxyCODONE, tiZANidine  DVT prophylaxis: Lovenox Code Status: DNR Family Communication: d/w patient in the room Disposition Plan: home when ready  Consultants:   None   Procedures:   None   Antimicrobials:  None    Objective: Vitals:   10/20/18 1549 10/20/18 1658 10/20/18 2050 10/21/18 0409  BP: (!) 162/81  (!) 147/57 (!) 126/48  Pulse: 83  87   Resp: 18     Temp: 100 F (37.8 C) 100.1 F (37.8 C) (!) 100.9 F (38.3 C) 100.3 F (37.9 C)  TempSrc: Oral  Oral Oral  SpO2: 93%   91%   Weight:      Height:        Intake/Output Summary (Last 24 hours) at 10/21/2018 0093 Last data filed at 10/21/2018 0145 Gross per 24 hour  Intake 960 ml  Output 675 ml  Net 285 ml   Filed Weights   10/16/18 0500 10/17/18 0500 10/19/18 0451  Weight: (!) 146.1 kg (!) 145.7 kg (!) 140.9 kg    Examination:  Constitutional: No apparent distress, sitting at the bedside commode Eyes: No scleral icterus ENMT: Moist mucous membranes Neck: normal, supple Respiratory: Clear to auscultation bilaterally without wheezing, faint bibasilar crackles.  Moves air well. Cardiovascular: Regular rate and rhythm, no murmurs appreciated.  1+ pitting lower extremity edema.  2+ pulses Abdomen: Slight discomfort on epigastric palpation, no guarding or rebound.  Positive bowel sounds  Musculoskeletal: no clubbing / cyanosis.  Skin: Bilateral lower extremity erythema, early venous stasis changes Neurologic: No focal deficits, equal strength Psychiatric: Normal judgment and insight. Alert and oriented x 3. Normal mood.  Data Reviewed: I have independently reviewed following labs and imaging studies   CBC: Recent Labs  Lab 10/15/18 0500 10/16/18 0230 10/17/18 0330 10/18/18 0540 10/20/18 0511 10/21/18 0300  WBC 3.4* 3.3* 3.1* 3.0* 2.3* 2.2*  NEUTROABS 2.2 2.3  --   --  1.3*  --   HGB 6.8* 7.6* 7.8* 7.5* 7.0* 7.2*  HCT 22.3* 25.1* 25.3* 24.2* 22.8* 23.6*  MCV 94.5 94.7 94.4 93.8 94.6 94.4  PLT 318 319 294 241 159 841*   Basic Metabolic Panel: Recent Labs  Lab 10/16/18 0230 10/17/18 0330 10/18/18 0540 10/20/18 0511 10/21/18 0300  NA 138 137 138 138 139  K 3.8 3.6 3.6 3.6 3.5  CL 98 95* 95* 96* 98  CO2 32 33* 35* 34* 32  GLUCOSE 200* 152* 118* 178* 95  BUN 34* 33* 34* 33* 27*  CREATININE 1.14* 1.19* 1.29* 1.26* 1.06*  CALCIUM 7.8* 7.9* 7.8* 7.8* 7.9*  MG  --  1.8 1.8  --   --   PHOS  --  3.5 3.2  --   --    GFR: Estimated Creatinine Clearance: 68.8 mL/min (A) (by C-G formula  based on SCr of 1.06 mg/dL (H)). Liver Function Tests: Recent Labs  Lab 10/15/18 0500 10/16/18 0230 10/18/18 0540 10/20/18 0511 10/21/18 0300  AST 14* 18 15 16 19   ALT 10 10 8 10 10   ALKPHOS 76 77 72 65 62  BILITOT 0.4 0.3 0.3 0.2* 0.4  PROT 5.4* 5.7* 5.5* 5.5* 5.6*  ALBUMIN 2.9* 2.9* 2.6* 2.5* 2.7*   No results for input(s): LIPASE, AMYLASE in the last 168 hours. No results for input(s): AMMONIA in the last 168 hours. Coagulation Profile: No results for input(s): INR, PROTIME in the last 168 hours. Cardiac Enzymes: No results for input(s): CKTOTAL, CKMB, CKMBINDEX, TROPONINI in the last 168 hours. BNP (last 3 results) No results for input(s): PROBNP in the last 8760 hours. HbA1C: No results for input(s): HGBA1C in the last 72 hours. CBG: Recent Labs  Lab 10/19/18 2106 10/20/18 0738 10/20/18 1135 10/20/18 1636 10/20/18 2054  GLUCAP 174* 163* 158* 102* 120*   Lipid Profile: No results for input(s): CHOL, HDL, LDLCALC, TRIG, CHOLHDL, LDLDIRECT in the last 72 hours. Thyroid Function Tests: No results for input(s): TSH, T4TOTAL, FREET4, T3FREE, THYROIDAB in the last 72 hours. Anemia Panel: Recent Labs    10/20/18 0511  FERRITIN 758*   Urine analysis:    Component Value Date/Time   COLORURINE YELLOW 04/05/2018 1559   APPEARANCEUR HAZY (A) 04/05/2018 1559   LABSPEC >1.030 (H) 04/05/2018 1559   PHURINE 5.5 04/05/2018 1559   GLUCOSEU NEGATIVE 04/05/2018 1559   HGBUR SMALL (A) 04/05/2018 1559   BILIRUBINUR SMALL (A) 04/05/2018 1559   KETONESUR 15 (A) 04/05/2018 1559   PROTEINUR 100 (A) 04/05/2018 1559   UROBILINOGEN 0.2 12/04/2014 0650   NITRITE NEGATIVE 04/05/2018 1559   LEUKOCYTESUR SMALL (A) 04/05/2018 1559   Sepsis Labs: Invalid input(s): PROCALCITONIN, LACTICIDVEN  Recent Results (from the past 240 hour(s))  SARS Coronavirus 2 (CEPHEID- Performed in [place] hospital lab), Hosp Order     Status: Abnormal   Collection Time: 10/11/18  3:34 PM    Specimen: Nasopharyngeal Swab  Result Value Ref Range Status   SARS Coronavirus 2 POSITIVE (A) NEGATIVE Final    Comment: RESULT CALLED TO, READ BACK BY AND VERIFIED WITH: EKUA ACQUAAH,RN [id number] @ 1601 BY J SCOTTON (NOTE) If result is NEGATIVE SARS-CoV-2 target nucleic acids are NOT DETECTED. The SARS-CoV-2 RNA is  generally detectable in upper and lower  respiratory specimens during the acute phase of infection. The lowest  concentration of SARS-CoV-2 viral copies this assay can detect is 250  copies / mL. A negative result does not preclude SARS-CoV-2 infection  and should not be used as the sole basis for treatment or other  patient management decisions.  A negative result may occur with  improper specimen collection / handling, submission of specimen other  than nasopharyngeal swab, presence of viral mutation(s) within the  areas targeted by this assay, and inadequate number of viral copies  (<250 copies / mL). A negative result must be combined with clinical  observations, patient history, and epidemiological information. If result is POSITIVE SARS-CoV-2 target nucleic acids are DETEC TED. The SARS-CoV-2 RNA is generally detectable in upper and lower  respiratory specimens during the acute phase of infection.  Positive  results are indicative of active infection with SARS-CoV-2.  Clinical  correlation with patient history and other diagnostic information is  necessary to determine patient infection status.  Positive results do  not rule out bacterial infection or co-infection with other viruses. If result is PRESUMPTIVE POSTIVE SARS-CoV-2 nucleic acids MAY BE PRESENT.   A presumptive positive result was obtained on the submitted specimen  and confirmed on repeat testing.  While 2019 novel coronavirus  (SARS-CoV-2) nucleic acids may be present in the submitted sample  additional confirmatory testing may be necessary for epidemiological  and / or clinical management purposes  to  differentiate between  SARS-CoV-2 and other Sarbecovirus currently known to infect humans.  If clinically indicated additional testing with an alternate test  methodology (LAB7 453) is advised. The SARS-CoV-2 RNA is generally  detectable in upper and lower respiratory specimens during the acute  phase of infection. The expected result is Negative. Fact Sheet for Patients:  StrictlyIdeas.no Fact Sheet for Healthcare Providers: BankingDealers.co.za This test is not yet approved or cleared by the [place] FDA and has been authorized for detection and/or diagnosis of SARS-CoV-2 by FDA under an Emergency Use Authorization (EUA).  This EUA will remain in effect (meaning this test can be used) for the duration of the COVID-19 declaration under Section 564(b)(1) of the Act, 21 U.S.C. section 360bbb-3(b)(1), unless the authorization is terminated or revoked sooner. Performed at [facility], [place] [address], [place], [place] [address]       Radiology Studies: No results found.   Marzetta Board, MD, PhD [facility]  Contact via  [email or web address]  [place] P: [phone]  F: [phone]

## 2018-10-21 NOTE — Progress Notes (Signed)
PT Cancellation Note  Patient Details Name: Stephanie Combs MRN: [id number] DOB: 30-Mar-1951   Cancelled Treatment:    Reason Eval/Treat Not Completed: Fatigue/lethargy limiting ability to participate;Pain limiting ability to participate  Patient reported she worked with OT to bathe herself (RN confirmed) and she is too fatigued/sore to do anything right now. Provided hot pack to left knee, rt shoulder, upper back/neck (with RN OK) and repositioned RUE for incr support.   Will attempt to see earlier in the day 10/22/18  Rexanne Mano, PT 10/21/2018, 3:49 PM

## 2018-10-21 NOTE — Progress Notes (Signed)
The pt's Daughter was called and a voicemail was left for the Daughter to call back and receive a daily update.

## 2018-10-21 NOTE — Progress Notes (Signed)
Stephanie Combs   DOB:1951/03/25   PJ#:[id number]   BHA#:[id number]  I connected with Laurella Tull on 10/21/2018 at  4:00 PM EDT by telephone visit and verified that I am speaking with the correct person using two identifiers.  I discussed the limitations, risks, security and privacy concerns of performing an evaluation and management service by telephone and the availability of in person appointments. I also discussed with the patient that there may be a patient responsible charge related to this service. The patient expressed understanding and agreed to proceed.  Patient's location:  [facility]  Provider's location:  My office   Subjective: Pt has been hospitalized sine 10/11/2018, has lost about 20Lb so far, dyspnea has improved. However she is still very fatigued, complains of intermittent gastric pain, and diarrhea.  Appetite is low, able to eat some. She had fever yesterday, cultures are pending.    Objective:  Vitals:   10/21/18 1000 10/21/18 1349  BP:    Pulse: 85 75  Resp:    Temp:    SpO2: 95% 96%    Body mass index is 57.74 kg/m.  Intake/Output Summary (Last 24 hours) at 10/21/2018 1602 Last data filed at 10/21/2018 1350 Gross per 24 hour  Intake 1020 ml  Output -  Net 1020 ml     CBG (last 3)  Recent Labs    10/20/18 2054 10/21/18 0814 10/21/18 1146  GLUCAP 120* 68* 127*     Labs:   Urine Studies No results for input(s): UHGB, CRYS in the last 72 hours.  Invalid input(s): UACOL, UAPR, USPG, UPH, UTP, UGL, UKET, UBIL, UNIT, UROB, [place], UEPI, UWBC, [place], [place], [place], [place], [place]  Basic Metabolic Panel: Recent Labs  Lab 10/16/18 0230 10/17/18 0330 10/18/18 0540 10/20/18 0511 10/21/18 0300  NA 138 137 138 138 139  K 3.8 3.6 3.6 3.6 3.5  CL 98 95* 95* 96* 98  CO2 32 33* 35* 34* 32  GLUCOSE 200* 152* 118* 178* 95  BUN 34* 33* 34* 33* 27*  CREATININE 1.14* 1.19* 1.29* 1.26* 1.06*  CALCIUM 7.8* 7.9* 7.8* 7.8* 7.9*  MG  --  1.8 1.8  --   --    PHOS  --  3.5 3.2  --   --    GFR Estimated Creatinine Clearance: 68.8 mL/min (A) (by C-G formula based on SCr of 1.06 mg/dL (H)). Liver Function Tests: Recent Labs  Lab 10/15/18 0500 10/16/18 0230 10/18/18 0540 10/20/18 0511 10/21/18 0300  AST 14* 18 15 16 19   ALT 10 10 8 10 10   ALKPHOS 76 77 72 65 62  BILITOT 0.4 0.3 0.3 0.2* 0.4  PROT 5.4* 5.7* 5.5* 5.5* 5.6*  ALBUMIN 2.9* 2.9* 2.6* 2.5* 2.7*   No results for input(s): LIPASE, AMYLASE in the last 168 hours. No results for input(s): AMMONIA in the last 168 hours. Coagulation profile No results for input(s): INR, PROTIME in the last 168 hours.  CBC: Recent Labs  Lab 10/15/18 0500 10/16/18 0230 10/17/18 0330 10/18/18 0540 10/20/18 0511 10/21/18 0300  WBC 3.4* 3.3* 3.1* 3.0* 2.3* 2.2*  NEUTROABS 2.2 2.3  --   --  1.3*  --   HGB 6.8* 7.6* 7.8* 7.5* 7.0* 7.2*  HCT 22.3* 25.1* 25.3* 24.2* 22.8* 23.6*  MCV 94.5 94.7 94.4 93.8 94.6 94.4  PLT 318 319 294 241 159 132*   Cardiac Enzymes: No results for input(s): CKTOTAL, CKMB, CKMBINDEX, TROPONINI in the last 168 hours. BNP: Invalid input(s): POCBNP CBG: Recent Labs  Lab 10/20/18 1135 10/20/18 1636 10/20/18 2054 10/21/18 0814 10/21/18 1146  GLUCAP 158* 102* 120* 68* 127*   D-Dimer Recent Labs    10/20/18 0511 10/21/18 0300  DDIMER 0.79* 0.89*   Hgb A1c No results for input(s): HGBA1C in the last 72 hours. Lipid Profile No results for input(s): CHOL, HDL, LDLCALC, TRIG, CHOLHDL, LDLDIRECT in the last 72 hours. Thyroid function studies No results for input(s): TSH, T4TOTAL, T3FREE, THYROIDAB in the last 72 hours.  Invalid input(s): FREET3 Anemia work up Recent Labs    10/20/18 0511 10/21/18 0300  FERRITIN 758* 980*   Microbiology No results found for this or any previous visit (from the past 240 hour(s)).    Studies:  No results found.  Assessment: 68 y.o. with   1.  Acute on chronic diastolic heart failure, improved  2. COPD with chronic  hypoxic respiratory failure, stable  3. SARS-CoV-2 positive, without pneumonia  4. Gastric pain and diarrhea, ? COVID gastritis  5.  Limited stage small cell lung cancer, status post chemotherapy, and 1 day radiation 6. Right colon cancer, not a candidate for surgery  7.  Pancytopenia, secondary to chemotherapy. 8. DM 9. HTN, CAD 10. RA 11. Depression and anxiety  12. Morbid obesity    Plan:  -lab reviewed, she has developed neutrioenia, given her fever yesterday, please consider Granix 416mcg injection daily if ANC<1.0K and hold if ANC>1.5K -Consider blood transfusion if Hg<7.0 -other management per primary team, and I very much appreciate their care, I spoke with Dr. Cruzita Lederer today  -will hold on chemo and radiation for now, and will reconsider if her SARS-CoV-2 became negative and she is clinically stable. Pt is anxious to restart cancer treatment, I answered all her questions  -I will be available if needed, she knows to call me if she has questions for me.     Truitt Merle, MD 10/21/2018  4:02 PM

## 2018-10-21 NOTE — Progress Notes (Signed)
Occupational Therapy Treatment Patient Details Name: Stephanie Combs MRN: [id number] DOB: 03-28-1951 Today's Date: 10/21/2018    History of present illness On 10/11/18, Stephanie Combs is a 68 y.o. female with medical history significant of diastolic heart failure, anxiety, COPD, diabetes mellitus, chronic respiratory failure on 2 L via nasal cannula, lung and colon cancer received chemo and radiation therapy.  Oncology referred pt for admission due to a 10 pound weight gain over the last week.  She is try to manage this with oral Lasix.  Reports some associated shortness of breath as well.  She has associated lower extremity swelling with weeping which is chronic. On admission she tested + COVID-19. per MD primarily acute on chronic CHF with COVID + an incidental finding. Transfer from [place] to [place].    OT comments  Pt making good progress. Able to transfer to [facility], complete pericare and UB ADL with set up. Pt states her nurse/daughter assist her with her skin care under her pannus. Pt may benefit from use of AE for LB ADL. VSS throughout session. Pt complaining of nausea and pain form "boil" on her L buttocks- nsg made aware. Will continue to follow.   Follow Up Recommendations  Home health OT;Supervision/Assistance - 24 hour    Equipment Recommendations  None recommended by OT    Recommendations for Other Services      Precautions / Restrictions Precautions Precautions: Fall       Mobility Bed Mobility Overal bed mobility: Needs Assistance       Supine to sit: Min assist     General bed mobility comments: A to transition trunk upright  Transfers Overall transfer level: Needs assistance   Transfers: Sit to/from Stand;Stand Pivot Transfers Sit to Stand: Supervision Stand pivot transfers: Min guard            Balance     Sitting balance-Leahy Scale: Good       Standing balance-Leahy Scale: Fair Standing balance comment: able to stand unsupported to complete pericare                           ADL either performed or assessed with clinical judgement   ADL Overall ADL's : Needs assistance/impaired     Grooming: Set up;Sitting   Upper Body Bathing: Set up;Sitting       Upper Body Dressing : Minimal assistance       Toilet Transfer: Min guard;BSC;Stand-pivot   Toileting- Clothing Manipulation and Hygiene: Set up;Sit to/from stand Toileting - Clothing Manipulation Details (indicate cue type and reason): Pt reaches posteriorly     Functional mobility during ADLs: Min guard(furniture walking) General ADL Comments: May benefit from use of sock aid and reacher for LB ADL     Vision       Perception     Praxis      Cognition Arousal/Alertness: Awake/alert Behavior During Therapy: WFL for tasks assessed/performed Overall Cognitive Status: Within Functional Limits for tasks assessed                                          Exercises     Shoulder Instructions       General Comments      Pertinent Vitals/ Pain       Pain Assessment: Faces Faces Pain Scale: Hurts little more Pain Location: BLEs Pain Descriptors / Indicators:  Discomfort Pain Intervention(s): Limited activity within patient's tolerance  Home Living                                          Prior Functioning/Environment              Frequency  Min 3X/week        Progress Toward Goals  OT Goals(current goals can now be found in the care plan section)  Progress towards OT goals: Progressing toward goals  Acute Rehab OT Goals Patient Stated Goal: return home with family and home aides to assist OT Goal Formulation: With patient Time For Goal Achievement: 10/28/18 Potential to Achieve Goals: Good ADL Goals Pt Will Perform Grooming: with modified independence;sitting Pt Will Perform Upper Body Bathing: with set-up;sitting Pt Will Perform Lower Body Bathing: sit to/from stand;with min assist;with adaptive  equipment Pt Will Transfer to Toilet: with modified independence;bedside commode;ambulating Pt Will Perform Toileting - Clothing Manipulation and hygiene: with modified independence;sitting/lateral leans;sit to/from stand;with adaptive equipment Additional ADL Goal #1: Pt will independently verbalize 3 energy conservation strategies for ADL tasks  Plan Discharge plan remains appropriate    Co-evaluation                 AM-PAC OT "6 Clicks" Daily Activity     Outcome Measure   Help from another person eating meals?: None Help from another person taking care of personal grooming?: A Little Help from another person toileting, which includes using toliet, bedpan, or urinal?: A Little Help from another person bathing (including washing, rinsing, drying)?: A Little Help from another person to put on and taking off regular upper body clothing?: A Little Help from another person to put on and taking off regular lower body clothing?: A Lot 6 Click Score: 18    End of Session Equipment Utilized During Treatment: Oxygen  OT Visit Diagnosis: Unsteadiness on feet (R26.81);Other abnormalities of gait and mobility (R26.89);Pain;Muscle weakness (generalized) (M62.81) Pain - part of body: (buttocks)   Activity Tolerance Patient tolerated treatment well   Patient Left in chair;with call bell/phone within reach   Nurse Communication Mobility status        Time: 7353-2992 OT Time Calculation (min): 59 min  Charges: OT General Charges $OT Visit: 1 Visit OT Treatments $Self Care/Home Management : 53-67 mins  Maurie Boettcher, OT/L   Acute OT Clinical Specialist [place] Pager [phone] Office [phone]    [facility] 10/21/2018, 5:27 PM

## 2018-10-21 NOTE — Progress Notes (Signed)
Voicemail left for patient;s daughter [name] with update on patient.

## 2018-10-21 NOTE — Progress Notes (Signed)
MD requested a daily weight on the pt prior to breakfast on 6/12.

## 2018-10-22 ENCOUNTER — Ambulatory Visit: Payer: Medicare Other

## 2018-10-22 LAB — CBC WITH DIFFERENTIAL/PLATELET
Abs Immature Granulocytes: 0.01 10*3/uL (ref 0.00–0.07)
Basophils Absolute: 0 10*3/uL (ref 0.0–0.1)
Basophils Relative: 0 %
Eosinophils Absolute: 0 10*3/uL (ref 0.0–0.5)
Eosinophils Relative: 1 %
HCT: 22.1 % — ABNORMAL LOW (ref 36.0–46.0)
Hemoglobin: 6.7 g/dL — CL (ref 12.0–15.0)
Immature Granulocytes: 0 %
Lymphocytes Relative: 39 %
Lymphs Abs: 1 10*3/uL (ref 0.7–4.0)
MCH: 28.6 pg (ref 26.0–34.0)
MCHC: 30.3 g/dL (ref 30.0–36.0)
MCV: 94.4 fL (ref 80.0–100.0)
Monocytes Absolute: 0.3 10*3/uL (ref 0.1–1.0)
Monocytes Relative: 13 %
Neutro Abs: 1.2 10*3/uL — ABNORMAL LOW (ref 1.7–7.7)
Neutrophils Relative %: 47 %
Platelets: 104 10*3/uL — ABNORMAL LOW (ref 150–400)
RBC: 2.34 MIL/uL — ABNORMAL LOW (ref 3.87–5.11)
RDW: 22.5 % — ABNORMAL HIGH (ref 11.5–15.5)
WBC: 2.5 10*3/uL — ABNORMAL LOW (ref 4.0–10.5)
nRBC: 0 % (ref 0.0–0.2)

## 2018-10-22 LAB — COMPREHENSIVE METABOLIC PANEL
ALT: 12 U/L (ref 0–44)
AST: 23 U/L (ref 15–41)
Albumin: 2.5 g/dL — ABNORMAL LOW (ref 3.5–5.0)
Alkaline Phosphatase: 65 U/L (ref 38–126)
Anion gap: 10 (ref 5–15)
BUN: 32 mg/dL — ABNORMAL HIGH (ref 8–23)
CO2: 33 mmol/L — ABNORMAL HIGH (ref 22–32)
Calcium: 7.6 mg/dL — ABNORMAL LOW (ref 8.9–10.3)
Chloride: 95 mmol/L — ABNORMAL LOW (ref 98–111)
Creatinine, Ser: 1.22 mg/dL — ABNORMAL HIGH (ref 0.44–1.00)
GFR calc Af Amer: 53 mL/min — ABNORMAL LOW (ref >60)
GFR calc non Af Amer: 45 mL/min — ABNORMAL LOW (ref >60)
Glucose, Bld: 171 mg/dL — ABNORMAL HIGH (ref 70–99)
Potassium: 3.5 mmol/L (ref 3.5–5.1)
Sodium: 138 mmol/L (ref 135–145)
Total Bilirubin: <0.1 mg/dL — ABNORMAL LOW (ref 0.3–1.2)
Total Protein: 5.4 g/dL — ABNORMAL LOW (ref 6.5–8.1)

## 2018-10-22 LAB — D-DIMER, QUANTITATIVE: D-Dimer, Quant: 0.88 ug/mL-FEU — ABNORMAL HIGH (ref 0.00–0.50)

## 2018-10-22 LAB — GLUCOSE, CAPILLARY
Glucose-Capillary: 154 mg/dL — ABNORMAL HIGH (ref 70–99)
Glucose-Capillary: 165 mg/dL — ABNORMAL HIGH (ref 70–99)
Glucose-Capillary: 137 mg/dL — ABNORMAL HIGH (ref 70–99)
Glucose-Capillary: 128 mg/dL — ABNORMAL HIGH (ref 70–99)

## 2018-10-22 LAB — PREPARE RBC (CROSSMATCH)

## 2018-10-22 LAB — FERRITIN: Ferritin: 918 ng/mL — ABNORMAL HIGH (ref 11–307)

## 2018-10-22 LAB — C-REACTIVE PROTEIN: CRP: 7.3 mg/dL — ABNORMAL HIGH (ref <1.0)

## 2018-10-22 LAB — LACTATE DEHYDROGENASE: LDH: 257 U/L — ABNORMAL HIGH (ref 98–192)

## 2018-10-22 MED ORDER — POTASSIUM CHLORIDE 20 MEQ/15ML (10%) PO SOLN
40.0000 meq | Freq: Once | ORAL | Status: AC
  Administered 2018-10-22: 40 meq via ORAL
  Filled 2018-10-22: qty 30

## 2018-10-22 MED ORDER — SODIUM CHLORIDE 0.9% IV SOLUTION
Freq: Once | INTRAVENOUS | Status: AC
  Administered 2018-10-22: 12:00:00 via INTRAVENOUS

## 2018-10-22 NOTE — Progress Notes (Signed)
Per Dr. Sarajane Jews, hold levemir for tonight. Patients's CBG 128 and is eating very little ate no dinner tonight and refused snack.

## 2018-10-22 NOTE — Progress Notes (Signed)
PROGRESS NOTE  Stephanie Combs EUM:[id number] DOB: 04/24/1951 DOA: 10/11/2018 PCP: [name], MD   LOS: 10 days   Brief Narrative / Interim history: 68 year old female with history of chronic diastolic CHF, EF 54%, small cell lung cancer as well as colon cancer currently on chemotherapy under the care of Dr. Burr Medico, anxiety, COPD on 2 L nasal cannula at baseline, diabetes mellitus who came in to [facility] long ED on 10/11/2018 with 10 pound weight gain, fluid overload, as well as subjective shortness of breath.  She tested positive for SARS-CoV2.  Chest x-ray on admission without any evidence of multifocal pneumonia or consolidation.  It did show mild vascular congestion with cardiomegaly  Subjective: Stomach cramping is improved, she is able to eat a little bit more but is struggling.  She has no appetite.  Denies diarrhea.  No chest pain, no shortness of breath.  Assessment & Plan: Principal Problem:   Acute on chronic diastolic CHF (congestive heart failure) (HCC) Active Problems:   COPD (chronic obstructive pulmonary disease) (HCC)   Morbid obesity (HCC)   DM type 2 (diabetes mellitus, type 2) (HCC)   CKD (chronic kidney disease), stage III (HCC)   Hypothyroidism   Anemia in CKD (chronic kidney disease)   Anxiety   Chronic respiratory failure with hypoxia (HCC)   CAD (coronary artery disease)   Chronic pain   Small cell lung cancer, left upper lobe (HCC)   Acute on chronic diastolic heart failure (HCC)   Principal Problem Acute on chronic diastolic heart failure with chronic hypoxic respiratory failure -Patient was placed on IV Lasix which was held on 10/19/2018 due to slight bump in creatinine. -Net -12.6 L since admission, weight 331 on admission, and 273 this morning.  Today was a standing weight, I wonder whether her initial weight was bed weight. -Renal function is stable, continue Lasix, she still has edema  Filed Weights   10/17/18 0500 10/19/18 0451 10/22/18 0500  Weight: (!)  145.7 kg (!) 140.9 kg 124.1 kg   Active Problems Covid gastroenteritis -Patient with intractable nausea, as well as abdominal discomfort when she eats.  Eating a little bit more today however p.o. intake remains limited -Supportive treatment with antiemetics, she appears to be allergic to Reglan and Phenergan -Also possibly contributing recent chemotherapy  COVID-19 Labs  Recent Labs    10/20/18 0511 10/21/18 0300 10/22/18 0400  DDIMER 0.79* 0.89* 0.88*  FERRITIN 758* 980* 918*  LDH  --  273* 257*  CRP 5.6* 6.9* 7.3*    Lab Results  Component Value Date   SARSCOV2NAA POSITIVE (A) 10/11/2018     SARS-CoV-2 positive -No clinical evidence of pneumonia, her respiratory status is stable, she is on 2 L nasal cannula which is at her baseline -Inflammatory markers overall stable  Pancytopenia / Anemia of chronic disease / Thrombocytopenia -Likely multifactorial in the setting of underlying malignancies as well as chronic kidney disease.  She did receive a unit of packed red blood cells for hemoglobin of less than 7 on 6/5.  -Hemoglobin this morning 6.7, transfuse 1 additional unit of packed red blood cells -Monitor for neutropenia, most recent A1c 1.2, if it drops less than 1K will start Granix -Platelets continue to drop related to chemotherapy, 104 this morning.  No bleeding.  Chronic kidney disease stage III -Creatinine varies between 1.2-1.5, currently appears close to baseline at 1. 2 -Continue Lasix  Lab Results  Component Value Date   CREATININE 1.22 (H) 10/22/2018   CREATININE 1.06 (H)  10/21/2018   CREATININE 1.26 (H) 10/20/2018    Type 2 diabetes mellitus -Had an episode of hypoglycemia on 6/11 in the morning, her Levemir was adjusted, no longer hypoglycemic today  Small cell lung cancer/colon cancer -Followed by Dr. Burr Medico as an outpatient colon cancer was initially diagnosed in April 2020 and and lung cancer was found in May 2020 during her work-up for colon  cancer.  Currently on chemotherapy, his most recent cycle #2 on 10/11/2018 -Febrile on 6/11, cultures were sent and no growth to date.  Coronary artery disease /hyperlipidemia -No chest pain, this appears to be asymptomatic  Hypertension -Blood pressure controlled  COPD with chronic hypoxic respiratory failure -Stable on 2 L, she is on chronic oxygen at home.  No new respiratory issues in the last day  Hypothyroidism -Continue Synthroid  Rheumatoid arthritis -Continue home medications  Depression/anxiety -Continue home medications  Morbid obesity -BMI 50    Scheduled Meds: . sodium chloride   Intravenous Once  . aspirin EC  81 mg Oral Daily  . cholecalciferol  5,000 Units Oral Daily  . docusate sodium  300 mg Oral Daily  . enoxaparin (LOVENOX) injection  70 mg Subcutaneous Q24H  . furosemide  40 mg Intravenous Daily  . gabapentin  600 mg Oral TID  . Gerhardt's butt cream   Topical TID  . insulin aspart  0-20 Units Subcutaneous TID WC  . insulin aspart  0-5 Units Subcutaneous QHS  . insulin detemir  30 Units Subcutaneous QHS  . levothyroxine  200 mcg Oral Daily  . nystatin   Topical TID  . pantoprazole  40 mg Oral BID  . polyethylene glycol  17 g Oral BID  . senna  2 tablet Oral BID WC  . topiramate  75 mg Oral QHS  . umeclidinium-vilanterol  1 puff Inhalation Daily  . vitamin C  500 mg Oral BID  . zinc sulfate  220 mg Oral Daily   Continuous Infusions: PRN Meds:.acetaminophen, ALPRAZolam, diphenhydrAMINE-zinc acetate, ondansetron (ZOFRAN) IV, oxyCODONE-acetaminophen **AND** oxyCODONE, tiZANidine  DVT prophylaxis: Lovenox Code Status: DNR Family Communication: d/w patient in the room Disposition Plan: home when ready  Consultants:   None   Procedures:   None   Antimicrobials:  None    Objective: Vitals:   10/22/18 0210 10/22/18 0500 10/22/18 0523 10/22/18 0741  BP: (!) 121/41  136/61 (!) 115/56  Pulse:    76  Resp: 17     Temp:   98.8 F (37.1  C) 97.8 F (36.6 C)  TempSrc:   Oral Axillary  SpO2:    97%  Weight:  124.1 kg    Height:        Intake/Output Summary (Last 24 hours) at 10/22/2018 1036 Last data filed at 10/22/2018 0617 Gross per 24 hour  Intake 480 ml  Output 350 ml  Net 130 ml   Filed Weights   10/17/18 0500 10/19/18 0451 10/22/18 0500  Weight: (!) 145.7 kg (!) 140.9 kg 124.1 kg    Examination:  Constitutional: No distress, very talkative this morning, laying in bed Eyes: No icterus seen ENMT: Moist mucous membranes Neck: normal, supple Respiratory: Clear to auscultation bilaterally, no wheezing, no crackles heard.  Moves air well. Cardiovascular: Regular rate and rhythm, no murmurs appreciated.  1+ pitting lower extremity edema, improving Abdomen: No abdominal tenderness on palpation, no guarding or rebound.  Positive bowel sounds Musculoskeletal: no clubbing / cyanosis.  Skin: Bilateral lower extremity edema above the ankles Neurologic: Nonfocal, equal strength Psychiatric: Normal  judgment and insight. Alert and oriented x 3. Normal mood.    Data Reviewed: I have independently reviewed following labs and imaging studies   CBC: Recent Labs  Lab 10/16/18 0230 10/17/18 0330 10/18/18 0540 10/20/18 0511 10/21/18 0300 10/22/18 0400  WBC 3.3* 3.1* 3.0* 2.3* 2.2* 2.5*  NEUTROABS 2.3  --   --  1.3*  --  1.2*  HGB 7.6* 7.8* 7.5* 7.0* 7.2* 6.7*  HCT 25.1* 25.3* 24.2* 22.8* 23.6* 22.1*  MCV 94.7 94.4 93.8 94.6 94.4 94.4  PLT 319 294 241 159 132* 712*   Basic Metabolic Panel: Recent Labs  Lab 10/17/18 0330 10/18/18 0540 10/20/18 0511 10/21/18 0300 10/22/18 0400  NA 137 138 138 139 138  K 3.6 3.6 3.6 3.5 3.5  CL 95* 95* 96* 98 95*  CO2 33* 35* 34* 32 33*  GLUCOSE 152* 118* 178* 95 171*  BUN 33* 34* 33* 27* 32*  CREATININE 1.19* 1.29* 1.26* 1.06* 1.22*  CALCIUM 7.9* 7.8* 7.8* 7.9* 7.6*  MG 1.8 1.8  --   --   --   PHOS 3.5 3.2  --   --   --    GFR: Estimated Creatinine Clearance: 55  mL/min (A) (by C-G formula based on SCr of 1.22 mg/dL (H)). Liver Function Tests: Recent Labs  Lab 10/16/18 0230 10/18/18 0540 10/20/18 0511 10/21/18 0300 10/22/18 0400  AST 18 15 16 19 23   ALT 10 8 10 10 12   ALKPHOS 77 72 65 62 65  BILITOT 0.3 0.3 0.2* 0.4 <0.1*  PROT 5.7* 5.5* 5.5* 5.6* 5.4*  ALBUMIN 2.9* 2.6* 2.5* 2.7* 2.5*   No results for input(s): LIPASE, AMYLASE in the last 168 hours. No results for input(s): AMMONIA in the last 168 hours. Coagulation Profile: No results for input(s): INR, PROTIME in the last 168 hours. Cardiac Enzymes: No results for input(s): CKTOTAL, CKMB, CKMBINDEX, TROPONINI in the last 168 hours. BNP (last 3 results) No results for input(s): PROBNP in the last 8760 hours. HbA1C: No results for input(s): HGBA1C in the last 72 hours. CBG: Recent Labs  Lab 10/21/18 0814 10/21/18 1146 10/21/18 1644 10/21/18 2040 10/22/18 0739  GLUCAP 68* 127* 95 154* 154*   Lipid Profile: No results for input(s): CHOL, HDL, LDLCALC, TRIG, CHOLHDL, LDLDIRECT in the last 72 hours. Thyroid Function Tests: No results for input(s): TSH, T4TOTAL, FREET4, T3FREE, THYROIDAB in the last 72 hours. Anemia Panel: Recent Labs    10/21/18 0300 10/22/18 0400  FERRITIN 980* 918*   Urine analysis:    Component Value Date/Time   COLORURINE YELLOW 04/05/2018 1559   APPEARANCEUR HAZY (A) 04/05/2018 1559   LABSPEC >1.030 (H) 04/05/2018 1559   PHURINE 5.5 04/05/2018 1559   GLUCOSEU NEGATIVE 04/05/2018 1559   HGBUR SMALL (A) 04/05/2018 1559   BILIRUBINUR SMALL (A) 04/05/2018 1559   KETONESUR 15 (A) 04/05/2018 1559   PROTEINUR 100 (A) 04/05/2018 1559   UROBILINOGEN 0.2 12/04/2014 0650   NITRITE NEGATIVE 04/05/2018 1559   LEUKOCYTESUR SMALL (A) 04/05/2018 1559   Sepsis Labs: Invalid input(s): PROCALCITONIN, LACTICIDVEN  Recent Results (from the past 240 hour(s))  Culture, blood (Routine X 2) w Reflex to ID Panel     Status: None (Preliminary result)   Collection  Time: 10/21/18 12:20 PM   Specimen: BLOOD  Result Value Ref Range Status   Specimen Description   Final    BLOOD RIGHT HAND Performed at [facility], [place] [address] [place], [place] [address]    Special Requests  Final    BOTTLES DRAWN AEROBIC ONLY Blood Culture adequate volume Performed at [facility] [address] [place], [place] [address]    Culture   Final    NO GROWTH < 24 HOURS Performed at [facility] [address], [place], [place] [address]    Report Status PENDING  Incomplete  Culture, blood (Routine X 2) w Reflex to ID Panel     Status: None (Preliminary result)   Collection Time: 10/21/18 12:25 PM   Specimen: BLOOD  Result Value Ref Range Status   Specimen Description   Final    BLOOD LEFT HAND Performed at [facility] [address] [place], [place] [address]    Special Requests   Final    BOTTLES DRAWN AEROBIC ONLY Blood Culture adequate volume Performed at [facility] [address] [place], [place] [address]    Culture   Final    NO GROWTH < 24 HOURS Performed at [facility] [address] [place],  [address]    Report Status PENDING  Incomplete      Radiology Studies: No results found.   Marzetta Board, MD, PhD [facility]  Contact via  [email or web address]  [place] P: [phone]  F: [phone]

## 2018-10-22 NOTE — Progress Notes (Signed)
Morning weight obtained via standing scale as patient is able to ambulate.

## 2018-10-22 NOTE — Progress Notes (Addendum)
Physical Therapy Treatment Patient Details Name: Stephanie Combs MRN: [id number] DOB: Jan 28, 1951 Today's Date: 10/22/2018    History of Present Illness On 10/11/18, DAVENE JOBIN is a 68 y.o. female with medical history significant of diastolic heart failure, anxiety, COPD, diabetes mellitus, chronic respiratory failure on 2 L via nasal cannula, lung and colon cancer received chemo and radiation therapy.  Oncology referred pt for admission due to a 10 pound weight gain over the last week.  She is try to manage this with oral Lasix.  Reports some associated shortness of breath as well.  She has associated lower extremity swelling with weeping which is chronic. On admission she tested + COVID-19. per MD primarily acute on chronic CHF with COVID + an incidental finding. Transfer from [place] to [place].     PT Comments    Patient initially refusing, however decided she needed to get up and worked with me. She insists that she is not losing ground by refusing to ambulate (at home she walks down the ramp with a rolling walker to the car for MD appointments and cancer treatments). She again would only practice sit to stand and stand-pivot transfers. She reports she is limited by nausea and RN notified and in to give medicine for this during session.   Follow Up Recommendations  Home health PT(continue HHPT --pt states she was gettign HHPT prior)     Equipment Recommendations  None recommended by PT    Recommendations for Other Services       Precautions / Restrictions Precautions Precautions: Fall Restrictions Weight Bearing Restrictions: No    Mobility  Bed Mobility Overal bed mobility: Modified Independent Bed Mobility: Supine to Sit     Supine to sit: Modified independent (Device/Increase time)     General bed mobility comments: requires incr time, no physical assist   Transfers Overall transfer level: Needs assistance Equipment used: None(holds onto arms of BSC or chair or bedrail)    Sit to Stand: Supervision Stand pivot transfers: Supervision       General transfer comment: x 2; for general safety  Ambulation/Gait             General Gait Details: pt declines to attempt in room amb with RW due to nausea   Stairs             Wheelchair Mobility    Modified Rankin (Stroke Patients Only)       Balance     Sitting balance-Leahy Scale: Good     Standing balance support: During functional activity;Single extremity supported Standing balance-Leahy Scale: Poor Standing balance comment: required at least single UE support                            Cognition Arousal/Alertness: Awake/alert Behavior During Therapy: WFL for tasks assessed/performed Overall Cognitive Status: Within Functional Limits for tasks assessed                                 General Comments: requires max encouargement to mobilize/participate      Exercises      General Comments General comments (skin integrity, edema, etc.): continues to be limitd by nausea; she is consistently getting to [facility] and chair with nursing but have been unable to push ambulation. Will attempt with 2nd person to close follow with chair and incr confidence/safety      Pertinent Vitals/Pain Pain  Assessment: Faces Faces Pain Scale: Hurts even more Pain Location: BLEs Pain Descriptors / Indicators: Discomfort Pain Intervention(s): Limited activity within patient's tolerance;Monitored during session;Repositioned    Home Living                      Prior Function            PT Goals (current goals can now be found in the care plan section) Acute Rehab PT Goals Patient Stated Goal: return home with family and home aides to assist PT Goal Formulation: With patient Time For Goal Achievement: 11/05/18 Potential to Achieve Goals: Fair Progress towards PT goals: Not progressing toward goals - comment(new goals set due to limited by medical issues)     Frequency    Min 3X/week      PT Plan Frequency needs to be updated;Discharge plan needs to be updated    Co-evaluation              AM-PAC PT "6 Clicks" Mobility   Outcome Measure  Help needed turning from your back to your side while in a flat bed without using bedrails?: None Help needed moving from lying on your back to sitting on the side of a flat bed without using bedrails?: None Help needed moving to and from a bed to a chair (including a wheelchair)?: A Little Help needed standing up from a chair using your arms (e.g., wheelchair or bedside chair)?: None Help needed to walk in hospital room?: A Little Help needed climbing 3-5 steps with a railing? : A Lot 6 Click Score: 20    End of Session Equipment Utilized During Treatment: Oxygen Activity Tolerance: Treatment limited secondary to medical complications (Comment)(nausea; self-limiting) Patient left: in chair;with call bell/phone within reach Nurse Communication: Mobility status;Other (comment)(+ used BSC) PT Visit Diagnosis: Other abnormalities of gait and mobility (R26.89);Muscle weakness (generalized) (M62.81)     Time: 1450-1550 PT Time Calculation (min) (ACUTE ONLY): 60 min  Charges:  $Therapeutic Activity: 23-37 mins(unbilled time for toileting and non-PT care)                        Rexanne Mano, PT 10/22/2018, 5:08 PM

## 2018-10-22 NOTE — Progress Notes (Signed)
Attempted to call daughter with update no response left voicemail.

## 2018-10-22 NOTE — Progress Notes (Signed)
CRITICAL VALUE ALERT  Critical Value:  Hgb 6.7  Date & Time Notied:  0628 10/22/18  Provider Notified:  Sarajane Jews  Orders Received/Actions taken: Awaiting new orders.

## 2018-10-22 NOTE — Progress Notes (Signed)
PT Cancellation Note  Patient Details Name: Stephanie Combs MRN: [id number] DOB: 23-Apr-1951   Cancelled Treatment:    Reason Eval/Treat Not Completed: Medical issues which prohibited therapy  Wanted to see patient this a.m. when perhaps less fatigued. Noted Hgb 6.7 with plans for blood transfusion. Due to pt's very limited mobility and activity tolerance, will defer until after she has blood to try to maximize her participation.  Jeanie Cooks Anja Neuzil, PT 10/22/2018, 11:21 AM

## 2018-10-23 LAB — GLUCOSE, CAPILLARY
Glucose-Capillary: 157 mg/dL — ABNORMAL HIGH (ref 70–99)
Glucose-Capillary: 220 mg/dL — ABNORMAL HIGH (ref 70–99)
Glucose-Capillary: 231 mg/dL — ABNORMAL HIGH (ref 70–99)
Glucose-Capillary: 167 mg/dL — ABNORMAL HIGH (ref 70–99)

## 2018-10-23 LAB — BASIC METABOLIC PANEL
Anion gap: 11 (ref 5–15)
BUN: 27 mg/dL — ABNORMAL HIGH (ref 8–23)
CO2: 30 mmol/L (ref 22–32)
Calcium: 8.1 mg/dL — ABNORMAL LOW (ref 8.9–10.3)
Chloride: 98 mmol/L (ref 98–111)
Creatinine, Ser: 1.05 mg/dL — ABNORMAL HIGH (ref 0.44–1.00)
GFR calc Af Amer: >60 mL/min (ref >60)
GFR calc non Af Amer: 55 mL/min — ABNORMAL LOW (ref >60)
Glucose, Bld: 142 mg/dL — ABNORMAL HIGH (ref 70–99)
Potassium: 4.3 mmol/L (ref 3.5–5.1)
Sodium: 139 mmol/L (ref 135–145)

## 2018-10-23 LAB — CBC WITH DIFFERENTIAL/PLATELET
Abs Immature Granulocytes: 0.01 10*3/uL (ref 0.00–0.07)
Basophils Absolute: 0 10*3/uL (ref 0.0–0.1)
Basophils Relative: 0 %
Eosinophils Absolute: 0 10*3/uL (ref 0.0–0.5)
Eosinophils Relative: 1 %
HCT: 24.5 % — ABNORMAL LOW (ref 36.0–46.0)
Hemoglobin: 7.5 g/dL — ABNORMAL LOW (ref 12.0–15.0)
Immature Granulocytes: 0 %
Lymphocytes Relative: 37 %
Lymphs Abs: 1 10*3/uL (ref 0.7–4.0)
MCH: 28.7 pg (ref 26.0–34.0)
MCHC: 30.6 g/dL (ref 30.0–36.0)
MCV: 93.9 fL (ref 80.0–100.0)
Monocytes Absolute: 0.3 10*3/uL (ref 0.1–1.0)
Monocytes Relative: 10 %
Neutro Abs: 1.4 10*3/uL — ABNORMAL LOW (ref 1.7–7.7)
Neutrophils Relative %: 52 %
Platelets: 99 10*3/uL — ABNORMAL LOW (ref 150–400)
RBC: 2.61 MIL/uL — ABNORMAL LOW (ref 3.87–5.11)
RDW: 22.2 % — ABNORMAL HIGH (ref 11.5–15.5)
WBC: 2.7 10*3/uL — ABNORMAL LOW (ref 4.0–10.5)
nRBC: 0 % (ref 0.0–0.2)

## 2018-10-23 LAB — FERRITIN: Ferritin: 908 ng/mL — ABNORMAL HIGH (ref 11–307)

## 2018-10-23 LAB — D-DIMER, QUANTITATIVE: D-Dimer, Quant: 0.81 ug/mL-FEU — ABNORMAL HIGH (ref 0.00–0.50)

## 2018-10-23 LAB — C-REACTIVE PROTEIN: CRP: 7.4 mg/dL — ABNORMAL HIGH (ref <1.0)

## 2018-10-23 MED ORDER — MEGESTROL ACETATE 40 MG PO TABS
40.0000 mg | ORAL_TABLET | Freq: Every day | ORAL | Status: DC
  Administered 2018-10-23 – 2018-10-30 (×8): 40 mg via ORAL
  Filled 2018-10-23 (×10): qty 1

## 2018-10-23 MED ORDER — LIDOCAINE-EPINEPHRINE 0.5 %-1:200000 IJ SOLN: 50.0000 mL | Freq: Once | INTRAMUSCULAR | Status: DC

## 2018-10-23 MED ORDER — CLINDAMYCIN PHOSPHATE 600 MG/50ML IV SOLN
600.0000 mg | Freq: Three times a day (TID) | INTRAVENOUS | Status: AC
  Administered 2018-10-23 – 2018-10-29 (×19): 600 mg via INTRAVENOUS
  Filled 2018-10-23 (×21): qty 50

## 2018-10-23 MED ORDER — INSULIN DETEMIR 100 UNIT/ML ~~LOC~~ SOLN
10.0000 [IU] | Freq: Every day | SUBCUTANEOUS | Status: DC
  Administered 2018-10-23 – 2018-10-29 (×7): 10 [IU] via SUBCUTANEOUS
  Filled 2018-10-23 (×8): qty 0.1

## 2018-10-23 MED ORDER — LIDOCAINE HCL 1 % IJ SOLN
20.0000 mL | Freq: Once | INTRAMUSCULAR | Status: AC
  Administered 2018-10-23: 20 mL
  Filled 2018-10-23: qty 20

## 2018-10-23 NOTE — Consult Note (Signed)
Reason for Consult left buttock abscess Referring Physician: Farrie Sann is an 68 y.o. female.  HPI: Asked see patient at the request of medicine for a left buttock abscess.  She has an extremely complex medical history consisting of severe COPD, morbid obesity with a BMI of over 55, diabetes, chronic home O2, lung cancer, who was admitted to the hospital 13 days ago secondary to acute on chronic congestive heart failure.  She is receiving chemotherapy for inoperable lung cancer.  She underwent COVID testing as part of her admission and tested positive.  She has been at the [facility] since that time for general medical care.  She has shown no signs of pneumonia or any progression of COVID manifestations over a 2-week window.  She is on home O2 secondary to her COPD and lung cancer.  She developed swelling in the left buttock on examination and this drained last night.  I was asked to see her to evaluate the left buttock abscess and she is somewhat neutropenic from chemotherapy as well.  She is having no fever or chills.  Past Medical History:  Diagnosis Date  . Anxiety   . CAD (coronary artery disease)    a. s/p DES to LAD and angioplasty to D1 in 05/2017  . CHF (congestive heart failure) (HCC)    Diastolic  . Chronic back pain   . Chronic pain   . COPD (chronic obstructive pulmonary disease) ([place])   . Degenerative disk disease   . Diabetes mellitus without complication ([place])   . History of medication noncompliance 11/2017   "the patient frequently self-adjusts medications without notifying her physicians"  . Hypertension   . Hypothyroidism   . On home O2    2.5 L N/C prn  . Pedal edema     Past Surgical History:  Procedure Laterality Date  . BIOPSY  09/09/2018   Procedure: BIOPSY;  Surgeon: Daneil Dolin, MD;  [facility];  Service: Endoscopy;;  gastric esophageal hepatic flexure colon  . COLONOSCOPY WITH PROPOFOL N/A 09/09/2018   Procedure: COLONOSCOPY WITH PROPOFOL;  Surgeon: Daneil Dolin, MD;  [facility];  Service: Endoscopy;  Laterality: N/A;  . CORONARY STENT INTERVENTION N/A 05/22/2017   Procedure: CORONARY STENT INTERVENTION;  Surgeon: Martinique, Peter M, MD;  Location: [place] [facility];  Service: Cardiovascular;  Laterality: N/A;  . ESOPHAGOGASTRODUODENOSCOPY (EGD) WITH PROPOFOL N/A 09/09/2018   Procedure: ESOPHAGOGASTRODUODENOSCOPY (EGD) WITH PROPOFOL;  Surgeon: Daneil Dolin, MD;  [facility];  Service: Endoscopy;  Laterality: N/A;  . HERNIA REPAIR    . POLYPECTOMY  09/09/2018   Procedure: POLYPECTOMY;  Surgeon: Daneil Dolin, MD;  [facility];  Service: Endoscopy;;  colon  . RIGHT/LEFT HEART CATH AND CORONARY ANGIOGRAPHY N/A 05/19/2017   Procedure: RIGHT/LEFT HEART CATH AND CORONARY ANGIOGRAPHY;  Surgeon: Leonie Man, MD;  Location: [facility];  Service: Cardiovascular;  Laterality: N/A;  . TUBAL LIGATION    . VIDEO BRONCHOSCOPY WITH ENDOBRONCHIAL ULTRASOUND Left 09/14/2018   Procedure: VIDEO BRONCHOSCOPY WITH ENDOBRONCHIAL ULTRASOUND with biopsies;  Surgeon: Margaretha Seeds, MD;  Location: [place];  Service: Thoracic;  Laterality: Left;    Family History  Problem Relation Age of Onset  . Stroke Mother        deceased 77   . Heart attack Father        deceased 57   . Diabetes Brother   .  Cerebral palsy Brother   . Pneumonia Brother   . Diabetes Other   . Heart attack Other   . Colon cancer Neg Hx     Social History:  reports that she quit smoking about 3 years ago. Her smoking use included cigarettes. She has a 67.50 pack-year smoking history. She has never used smokeless tobacco. She reports that she does not drink alcohol or use drugs.  Allergies:  Allergies  Allergen Reactions  . Dilaudid [Hydromorphone Hcl] Itching  . Midodrine Hcl Swelling    After one dose had anaphylactic reaction, had to call EMS.  Marland Kitchen Actifed Cold-Allergy  [Chlorpheniramine-Phenylephrine]     "I was sick and red and it didn't agree with me at all"  . Doxycycline Nausea And Vomiting  . Other Cough    Pt states she is allergic to ragweed and that she starts coughing and sneezing like crazy  . Penicillins Hives and Itching    Tolerates Rocephin Has patient had a PCN reaction causing immediate rash, facial/tongue/throat swelling, SOB or lightheadedness with hypotension: Yes Has patient had a PCN reaction causing severe rash involving mucus membranes or skin necrosis: No Has patient had a PCN reaction that required hospitalization Yes Has patient had a PCN reaction occurring within the last 10 years: No If all of the above answers are "NO", then may proceed with Cephalosporin use.   . Reglan [Metoclopramide] Itching  . Valium [Diazepam] Itching  . Vistaril [Hydroxyzine Hcl] Itching    Medications: I have reviewed the patient's current medications.  Results for orders placed or performed during the hospital encounter of 10/11/18 (from the past 48 hour(s))  Glucose, capillary     Status: None   Collection Time: 10/21/18  4:44 PM  Result Value Ref Range   Glucose-Capillary 95 70 - 99 mg/dL  Glucose, capillary     Status: Abnormal   Collection Time: 10/21/18  8:40 PM  Result Value Ref Range   Glucose-Capillary 154 (H) 70 - 99 mg/dL  CBC with Differential/Platelet     Status: Abnormal   Collection Time: 10/22/18  4:00 AM  Result Value Ref Range   WBC 2.5 (L) 4.0 - 10.5 K/uL   RBC 2.34 (L) 3.87 - 5.11 MIL/uL   Hemoglobin 6.7 (LL) 12.0 - 15.0 g/dL    Comment: This critical result has verified and been called to [place] by Raelyn Ensign on 06 12 2020 at 0626, and has been read back. CRITICAL RESULT VERIFIED   HCT 22.1 (L) 36.0 - 46.0 %   MCV 94.4 80.0 - 100.0 fL   MCH 28.6 26.0 - 34.0 pg   MCHC 30.3 30.0 - 36.0 g/dL   RDW 22.5 (H) 11.5 - 15.5 %   Platelets 104 (L) 150 - 400 K/uL    Comment: Immature Platelet Fraction may be clinically  indicated, consider ordering this additional test [id number]    nRBC 0.0 0.0 - 0.2 %   Neutrophils Relative % 47 %   Neutro Abs 1.2 (L) 1.7 - 7.7 K/uL   Lymphocytes Relative 39 %   Lymphs Abs 1.0 0.7 - 4.0 K/uL   Monocytes Relative 13 %   Monocytes Absolute 0.3 0.1 - 1.0 K/uL   Eosinophils Relative 1 %   Eosinophils Absolute 0.0 0.0 - 0.5 K/uL   Basophils Relative 0 %   Basophils Absolute 0.0 0.0 - 0.1 K/uL   Immature Granulocytes 0 %   Abs Immature Granulocytes 0.01 0.00 - 0.07 K/uL   Polychromasia PRESENT  Comment: Performed at [facility], [place] [address], [place], [place] [address]  Comprehensive metabolic panel     Status: Abnormal   Collection Time: 10/22/18  4:00 AM  Result Value Ref Range   Sodium 138 135 - 145 mmol/L   Potassium 3.5 3.5 - 5.1 mmol/L   Chloride 95 (L) 98 - 111 mmol/L   CO2 33 (H) 22 - 32 mmol/L   Glucose, Bld 171 (H) 70 - 99 mg/dL   BUN 32 (H) 8 - 23 mg/dL   Creatinine, Ser 1.22 (H) 0.44 - 1.00 mg/dL   Calcium 7.6 (L) 8.9 - 10.3 mg/dL   Total Protein 5.4 (L) 6.5 - 8.1 g/dL   Albumin 2.5 (L) 3.5 - 5.0 g/dL   AST 23 15 - 41 U/L   ALT 12 0 - 44 U/L   Alkaline Phosphatase 65 38 - 126 U/L   Total Bilirubin <0.1 (L) 0.3 - 1.2 mg/dL    Comment: REPEATED TO VERIFY   GFR calc non Af Amer 45 (L) >60 mL/min   GFR calc Af Amer 53 (L) >60 mL/min   Anion gap 10 5 - 15    Comment: Performed at [facility], [place] [address] [place], [name] [address]  C-reactive protein     Status: Abnormal   Collection Time: 10/22/18  4:00 AM  Result Value Ref Range   CRP 7.3 (H) <1.0 mg/dL    Comment: Performed at [facility], [place] [address], [place], [name] [address]  Ferritin     Status: Abnormal   Collection Time: 10/22/18  4:00 AM  Result Value Ref Range   Ferritin 918 (H) 11 - 307 ng/mL    Comment: Performed at [facility], [place] [address], [place], [name] [address]  Lactate dehydrogenase      Status: Abnormal   Collection Time: 10/22/18  4:00 AM  Result Value Ref Range   LDH 257 (H) 98 - 192 U/L    Comment: Performed at [facility], [place] [address], [place], [place] [address]  D-dimer, quantitative (not at  [facility])     Status: Abnormal   Collection Time: 10/22/18  4:00 AM  Result Value Ref Range   D-Dimer, Quant 0.88 (H) 0.00 - 0.50 ug/mL-FEU    Comment: (NOTE) At the manufacturer cut-off of 0.50 ug/mL FEU, this assay has been documented to exclude PE with a sensitivity and negative predictive value of 97 to 99%.  At this time, this assay has not been approved by the FDA to exclude DVT/VTE. Results should be correlated with clinical presentation. Performed at [facility], [place] [address] [place], [place] [address]   Prepare RBC     Status: None   Collection Time: 10/22/18  6:55 AM  Result Value Ref Range   Order Confirmation      ORDER PROCESSED BY BLOOD BANK Performed at [facility], [place] [address], [place], [place] [address]   Type and screen [place]     Status: None   Collection Time: 10/22/18  6:55 AM  Result Value Ref Range   ABO/RH(D) A POS    Antibody Screen POS    Sample Expiration 10/25/2018,2359    Antibody Identification ANTI JKA (Kidd a)    Unit Number [id number]    Blood Component Type RED CELLS,LR    Unit division 00    Status of Unit ISSUED,FINAL    Donor AG Type NEGATIVE  FOR KIDD A ANTIGEN    Transfusion Status OK TO TRANSFUSE    Crossmatch Result COMPATIBLE   Glucose, capillary     Status: Abnormal   Collection Time: 10/22/18  7:39 AM  Result Value Ref Range   Glucose-Capillary 154 (H) 70 - 99 mg/dL  Glucose, capillary     Status: Abnormal   Collection Time: 10/22/18 11:43 AM  Result Value Ref Range   Glucose-Capillary 165 (H) 70 - 99 mg/dL  Glucose, capillary     Status: Abnormal   Collection Time: 10/22/18  4:03 PM  Result Value Ref Range    Glucose-Capillary 137 (H) 70 - 99 mg/dL  Glucose, capillary     Status: Abnormal   Collection Time: 10/22/18  8:28 PM  Result Value Ref Range   Glucose-Capillary 128 (H) 70 - 99 mg/dL  CBC with Differential/Platelet     Status: Abnormal   Collection Time: 10/23/18  5:00 AM  Result Value Ref Range   WBC 2.7 (L) 4.0 - 10.5 K/uL   RBC 2.61 (L) 3.87 - 5.11 MIL/uL   Hemoglobin 7.5 (L) 12.0 - 15.0 g/dL   HCT 24.5 (L) 36.0 - 46.0 %   MCV 93.9 80.0 - 100.0 fL   MCH 28.7 26.0 - 34.0 pg   MCHC 30.6 30.0 - 36.0 g/dL   RDW 22.2 (H) 11.5 - 15.5 %   Platelets 99 (L) 150 - 400 K/uL    Comment: REPEATED TO VERIFY Immature Platelet Fraction may be clinically indicated, consider ordering this additional test [id number] CONSISTENT WITH PREVIOUS RESULT    nRBC 0.0 0.0 - 0.2 %   Neutrophils Relative % 52 %   Neutro Abs 1.4 (L) 1.7 - 7.7 K/uL   Lymphocytes Relative 37 %   Lymphs Abs 1.0 0.7 - 4.0 K/uL   Monocytes Relative 10 %   Monocytes Absolute 0.3 0.1 - 1.0 K/uL   Eosinophils Relative 1 %   Eosinophils Absolute 0.0 0.0 - 0.5 K/uL   Basophils Relative 0 %   Basophils Absolute 0.0 0.0 - 0.1 K/uL   Immature Granulocytes 0 %   Abs Immature Granulocytes 0.01 0.00 - 0.07 K/uL   Ovalocytes PRESENT     Comment: Performed at [facility], [place] [address], [place], [place] [address]  Basic metabolic panel     Status: Abnormal   Collection Time: 10/23/18  5:00 AM  Result Value Ref Range   Sodium 139 135 - 145 mmol/L   Potassium 4.3 3.5 - 5.1 mmol/L    Comment: DELTA CHECK NOTED NO VISIBLE HEMOLYSIS    Chloride 98 98 - 111 mmol/L   CO2 30 22 - 32 mmol/L   Glucose, Bld 142 (H) 70 - 99 mg/dL   BUN 27 (H) 8 - 23 mg/dL   Creatinine, Ser 1.05 (H) 0.44 - 1.00 mg/dL   Calcium 8.1 (L) 8.9 - 10.3 mg/dL   GFR calc non Af Amer 55 (L) >60 mL/min   GFR calc Af Amer >60 >60 mL/min   Anion gap 11 5 - 15    Comment: Performed at [facility], [place] [address]  [place], [place] [address]  C-reactive protein     Status: Abnormal   Collection Time: 10/23/18  5:00 AM  Result Value Ref Range   CRP 7.4 (H) <1.0 mg/dL    Comment: Performed at [facility], [place] [address] [place], [name] [address]  Ferritin     Status: Abnormal   Collection Time:  10/23/18  5:00 AM  Result Value Ref Range   Ferritin 908 (H) 11 - 307 ng/mL    Comment: Performed at [facility], [place] [address], [place], [place] [address]  D-dimer, quantitative (not at [facility])     Status: Abnormal   Collection Time: 10/23/18  5:00 AM  Result Value Ref Range   D-Dimer, Quant 0.81 (H) 0.00 - 0.50 ug/mL-FEU    Comment: (NOTE) At the manufacturer cut-off of 0.50 ug/mL FEU, this assay has been documented to exclude PE with a sensitivity and negative predictive value of 97 to 99%.  At this time, this assay has not been approved by the FDA to exclude DVT/VTE. Results should be correlated with clinical presentation. Performed at [facility], [place] [address] [place], [place] [address]   Glucose, capillary     Status: Abnormal   Collection Time: 10/23/18  8:07 AM  Result Value Ref Range   Glucose-Capillary 157 (H) 70 - 99 mg/dL  Glucose, capillary     Status: Abnormal   Collection Time: 10/23/18 12:31 PM  Result Value Ref Range   Glucose-Capillary 220 (H) 70 - 99 mg/dL    No results found.  Review of Systems  Constitutional: Negative for chills and fever.  Respiratory: Positive for shortness of breath and wheezing. Negative for cough and sputum production.   Cardiovascular: Positive for orthopnea and leg swelling.  Gastrointestinal: Negative for abdominal pain.  Psychiatric/Behavioral: Positive for depression.  All other systems reviewed and are negative.  Blood pressure 128/73, pulse 78, temperature 99.1 F (37.3 C), resp. rate 16, height 5' 1.5" (1.562 m), weight 132 kg, SpO2 97 %. Physical Exam  Constitutional: She has a  sickly appearance.  Neck: Normal range of motion.  GI: Soft.  Genitourinary:       Assessment/Plan: Left buttock abscess in a 68 year old female with end-stage lung cancer and COPD with COVID infection not showing any progression of this and neutropenia secondary to chemotherapy  Discussed incision and drainage with the patient at the bedside.  Risk and benefits discussed the patient agreed to proceed.  Under sterile conditions the left buttock was prepped.  1% lidocaine used to inject the area.  Cruciate incision was made and a small opening already present it was draining pus.  This was then made larger to help facilitate drainage.  There is no further pus to be drained.  Dry dressings were applied for hemostasis.  The patient did very well with the procedure.  Recommend dry dressings changing it daily as needed.  Sitz bath if able.  No further follow-up needed.  Recommend clindamycin for 7 days 3 mg p.o. 3 times daily or IV.  Laurel Harnden A Birdie Beveridge 10/23/2018, 1:17 PM

## 2018-10-23 NOTE — Progress Notes (Signed)
PHARMACY CONSULT: Lovenox for VTE prophylaxis COVID protocol  Wt: 132.0kg BMI:  54 Scr:  1.05 CrCl >30 ml/hr  Hgb: 7.5 s/p PRBC on 6/5 and 6/12 but no overt bleeding.  Pltc down to 99 D-dimer: 0.81  A/P:  Due to BMI > 35, continue weight-adjusted lovenox 0.5mg /kg sq q24h  Monitor weight, CBC and renal function, adjust as needed  Albertina Parr, PharmD., BCPS Clinical Pharmacist Clinical phone for 10/23/18 until 5pm: [phone]

## 2018-10-23 NOTE — Progress Notes (Signed)
PROGRESS NOTE  Stephanie Combs OFB:[id number] DOB: Dec 11, 1950 DOA: 10/11/2018 PCP: [name], MD   LOS: 11 days   Brief Narrative / Interim history: 68 year old female with history of chronic diastolic CHF, EF 78%, small cell lung cancer as well as colon cancer currently on chemotherapy under the care of Dr. Burr Medico, anxiety, COPD on 2 L nasal cannula at baseline, diabetes mellitus who came in to [facility] long ED on 10/11/2018 with 10 pound weight gain, fluid overload, as well as subjective shortness of breath.  She tested positive for SARS-CoV2.  Chest x-ray on admission without any evidence of multifocal pneumonia or consolidation.  It did show mild vascular congestion with cardiomegaly  Subjective: Continues to feel overall weak, poor appetite, mild nausea and abdominal cramping postprandial.  Denies any fever or chills.  No vomiting.  Denies any shortness of breath.  Assessment & Plan: Principal Problem:   Acute on chronic diastolic CHF (congestive heart failure) (HCC) Active Problems:   COPD (chronic obstructive pulmonary disease) (HCC)   Morbid obesity (HCC)   DM type 2 (diabetes mellitus, type 2) (HCC)   CKD (chronic kidney disease), stage III (HCC)   Hypothyroidism   Anemia in CKD (chronic kidney disease)   Anxiety   Chronic respiratory failure with hypoxia (HCC)   CAD (coronary artery disease)   Chronic pain   Small cell lung cancer, left upper lobe (HCC)   Acute on chronic diastolic heart failure (HCC)   Principal Problem Acute on chronic diastolic heart failure with chronic hypoxic respiratory failure -Patient was placed on IV Lasix which was held on 10/19/2018 due to slight bump in creatinine. -Continue Lasix, net -12 L, with overall improved from 331 on admission to 291 this morning. -Renal function is stable, continue Lasix, she still has edema  Filed Weights   10/19/18 0451 10/22/18 0500 10/23/18 0500  Weight: (!) 140.9 kg 124.1 kg 132 kg   Active Problems Covid  gastroenteritis -Patient with intractable nausea, as well as abdominal discomfort when she eats.  Continue supportive treatment -Also possibly contributing recent chemotherapy, given poor appetite will add Megace  COVID-19 Labs  Recent Labs    10/21/18 0300 10/22/18 0400 10/23/18 0500  DDIMER 0.89* 0.88* 0.81*  FERRITIN 980* 918* 908*  LDH 273* 257*  --   CRP 6.9* 7.3* 7.4*    Lab Results  Component Value Date   SARSCOV2NAA POSITIVE (A) 10/11/2018   SARS-CoV-2 positive -No clinical evidence of pneumonia, her respiratory status is stable, she is on 2 L nasal cannula which is at her baseline -Inflammatory markers overall stable  Buttock abscess -Somewhat unnoticed until this morning when abscess drained spontaneously.  Still has a significant fluctuant area, have consulted general surgery, discussed with Dr. Brantley Stage who evaluated patient and perform a bedside I&D -Given immunosuppression will add antibiotics.  She has a significant number of allergies, added clindamycin  Pancytopenia / Anemia of chronic disease / Thrombocytopenia -Likely multifactorial in the setting of underlying malignancies as well as chronic kidney disease.  She did receive a unit of packed red blood cells for hemoglobin of less than 7 on 6/5 and repeat transfusion on 6/12 -Hemoglobin stable this morning -Monitor for neutropenia, ANC seems to be stabilized and no longer dropping -Platelets continue to drop, no bleeding  Chronic kidney disease stage III -Creatinine varies between 1.2-1.5, -Creatinine at baseline, continue diuresis  Lab Results  Component Value Date   CREATININE 1.05 (H) 10/23/2018   CREATININE 1.22 (H) 10/22/2018   CREATININE 1.06 (  H) 10/21/2018    Type 2 diabetes mellitus -Levemir was held last night due to poor p.o. intake.  CBGs fairly stable.  I have decrease the Levemir from 30 to 10 units  Small cell lung cancer/colon cancer -Followed by Dr. Burr Medico as an outpatient colon cancer  was initially diagnosed in April 2020 and and lung cancer was found in May 2020 during her work-up for colon cancer.  Currently on chemotherapy, his most recent cycle #2 on 10/11/2018 -Febrile on 6/11, cultures were sent and no growth to date.  Fever likely related to her buttock abscess  Coronary artery disease /hyperlipidemia -No chest pain, this appears to be asymptomatic  Hypertension -Blood pressure controlled  COPD with chronic hypoxic respiratory failure -Stable on 2 L, she is on chronic oxygen at home.  No new respiratory issues in the last day  Hypothyroidism -Continue Synthroid  Rheumatoid arthritis -Continue home medications  Depression/anxiety -Continue home medications  Morbid obesity -BMI 50    Scheduled Meds: . aspirin EC  81 mg Oral Daily  . cholecalciferol  5,000 Units Oral Daily  . docusate sodium  300 mg Oral Daily  . enoxaparin (LOVENOX) injection  70 mg Subcutaneous Q24H  . furosemide  40 mg Intravenous Daily  . gabapentin  600 mg Oral TID  . Gerhardt's butt cream   Topical TID  . insulin aspart  0-20 Units Subcutaneous TID WC  . insulin aspart  0-5 Units Subcutaneous QHS  . insulin detemir  30 Units Subcutaneous QHS  . levothyroxine  200 mcg Oral Daily  . megestrol  40 mg Oral Daily  . nystatin   Topical TID  . pantoprazole  40 mg Oral BID  . polyethylene glycol  17 g Oral BID  . senna  2 tablet Oral BID WC  . topiramate  75 mg Oral QHS  . umeclidinium-vilanterol  1 puff Inhalation Daily  . vitamin C  500 mg Oral BID  . zinc sulfate  220 mg Oral Daily   Continuous Infusions: PRN Meds:.acetaminophen, ALPRAZolam, diphenhydrAMINE-zinc acetate, ondansetron (ZOFRAN) IV, oxyCODONE-acetaminophen **AND** oxyCODONE, tiZANidine  DVT prophylaxis: Lovenox Code Status: DNR Family Communication: d/w patient in the room Disposition Plan: home when ready  Consultants:   None   Procedures:   None   Antimicrobials:  None    Objective: Vitals:    10/23/18 0448 10/23/18 0500 10/23/18 0526 10/23/18 0937  BP: (!) 114/55  128/73   Pulse: 78     Resp: 15  16   Temp: 98 F (36.7 C)  98.8 F (37.1 C) 99.1 F (37.3 C)  TempSrc: Axillary  Oral   SpO2: 97%     Weight:  132 kg    Height:        Intake/Output Summary (Last 24 hours) at 10/23/2018 1238 Last data filed at 10/23/2018 0526 Gross per 24 hour  Intake 450 ml  Output 700 ml  Net -250 ml   Filed Weights   10/19/18 0451 10/22/18 0500 10/23/18 0500  Weight: (!) 140.9 kg 124.1 kg 132 kg    Examination:  Constitutional: No distress, on the bedside commode Eyes: No scleral icterus ENMT: Moist mucous membranes Neck: normal, supple Respiratory: Clear to auscultation bilaterally without wheezing or crackles Cardiovascular: Regular rate and rhythm, no murmurs.  1+ pitting lower extremity edema Abdomen: No abdominal tenderness, no guarding or rebound Musculoskeletal: no clubbing / cyanosis.  Skin: Bilateral lower extremity edema above the ankles.  About 5 cm area of firm induration right buttock, tender  to palpation, spontaneously draining Neurologic: No focal deficits, ambulatory Psychiatric: Normal judgment and insight. Alert and oriented x 3. Normal mood.    Data Reviewed: I have independently reviewed following labs and imaging studies   CBC: Recent Labs  Lab 10/18/18 0540 10/20/18 0511 10/21/18 0300 10/22/18 0400 10/23/18 0500  WBC 3.0* 2.3* 2.2* 2.5* 2.7*  NEUTROABS  --  1.3*  --  1.2* 1.4*  HGB 7.5* 7.0* 7.2* 6.7* 7.5*  HCT 24.2* 22.8* 23.6* 22.1* 24.5*  MCV 93.8 94.6 94.4 94.4 93.9  PLT 241 159 132* 104* 99*   Basic Metabolic Panel: Recent Labs  Lab 10/17/18 0330 10/18/18 0540 10/20/18 0511 10/21/18 0300 10/22/18 0400 10/23/18 0500  NA 137 138 138 139 138 139  K 3.6 3.6 3.6 3.5 3.5 4.3  CL 95* 95* 96* 98 95* 98  CO2 33* 35* 34* 32 33* 30  GLUCOSE 152* 118* 178* 95 171* 142*  BUN 33* 34* 33* 27* 32* 27*  CREATININE 1.19* 1.29* 1.26* 1.06* 1.22*  1.05*  CALCIUM 7.9* 7.8* 7.8* 7.9* 7.6* 8.1*  MG 1.8 1.8  --   --   --   --   PHOS 3.5 3.2  --   --   --   --    GFR: Estimated Creatinine Clearance: 66.5 mL/min (A) (by C-G formula based on SCr of 1.05 mg/dL (H)). Liver Function Tests: Recent Labs  Lab 10/18/18 0540 10/20/18 0511 10/21/18 0300 10/22/18 0400  AST 15 16 19 23   ALT 8 10 10 12   ALKPHOS 72 65 62 65  BILITOT 0.3 0.2* 0.4 <0.1*  PROT 5.5* 5.5* 5.6* 5.4*  ALBUMIN 2.6* 2.5* 2.7* 2.5*   No results for input(s): LIPASE, AMYLASE in the last 168 hours. No results for input(s): AMMONIA in the last 168 hours. Coagulation Profile: No results for input(s): INR, PROTIME in the last 168 hours. Cardiac Enzymes: No results for input(s): CKTOTAL, CKMB, CKMBINDEX, TROPONINI in the last 168 hours. BNP (last 3 results) No results for input(s): PROBNP in the last 8760 hours. HbA1C: No results for input(s): HGBA1C in the last 72 hours. CBG: Recent Labs  Lab 10/22/18 0739 10/22/18 1143 10/22/18 1603 10/22/18 2028 10/23/18 0807  GLUCAP 154* 165* 137* 128* 157*   Lipid Profile: No results for input(s): CHOL, HDL, LDLCALC, TRIG, CHOLHDL, LDLDIRECT in the last 72 hours. Thyroid Function Tests: No results for input(s): TSH, T4TOTAL, FREET4, T3FREE, THYROIDAB in the last 72 hours. Anemia Panel: Recent Labs    10/22/18 0400 10/23/18 0500  FERRITIN 918* 908*   Urine analysis:    Component Value Date/Time   COLORURINE YELLOW 04/05/2018 1559   APPEARANCEUR HAZY (A) 04/05/2018 1559   LABSPEC >1.030 (H) 04/05/2018 1559   PHURINE 5.5 04/05/2018 1559   GLUCOSEU NEGATIVE 04/05/2018 1559   HGBUR SMALL (A) 04/05/2018 1559   BILIRUBINUR SMALL (A) 04/05/2018 1559   KETONESUR 15 (A) 04/05/2018 1559   PROTEINUR 100 (A) 04/05/2018 1559   UROBILINOGEN 0.2 12/04/2014 0650   NITRITE NEGATIVE 04/05/2018 1559   LEUKOCYTESUR SMALL (A) 04/05/2018 1559   Sepsis Labs: Invalid input(s): PROCALCITONIN, LACTICIDVEN  Recent Results (from  the past 240 hour(s))  Culture, blood (Routine X 2) w Reflex to ID Panel     Status: None (Preliminary result)   Collection Time: 10/21/18 12:20 PM   Specimen: BLOOD  Result Value Ref Range Status   Specimen Description   Final    BLOOD RIGHT HAND Performed at [facility], [address]  [place], [place] [address]    Special Requests   Final    BOTTLES DRAWN AEROBIC ONLY Blood Culture adequate volume Performed at [facility] [address] [place], [place] [address]    Culture   Final    NO GROWTH 2 DAYS Performed at [facility] [address], [place], [place] [address]    Report Status PENDING  Incomplete  Culture, blood (Routine X 2) w Reflex to ID Panel     Status: None (Preliminary result)   Collection Time: 10/21/18 12:25 PM   Specimen: BLOOD  Result Value Ref Range Status   Specimen Description   Final    BLOOD LEFT HAND Performed at [facility] [address] [place], [place] [address]    Special Requests   Final    BOTTLES DRAWN AEROBIC ONLY Blood Culture adequate volume Performed at [facility] [address], [place], [place] [address]    Culture   Final    NO GROWTH 2 DAYS Performed at [facility] [address], [place], [place] [address]    Report Status PENDING  Incomplete      Radiology Studies: No results found.   Marzetta Board, MD, PhD [facility]  Contact via  [email or web address]  [place] P: [phone]  F: [phone]

## 2018-10-24 LAB — COMPREHENSIVE METABOLIC PANEL
ALT: 18 U/L (ref 0–44)
AST: 26 U/L (ref 15–41)
Albumin: 2.5 g/dL — ABNORMAL LOW (ref 3.5–5.0)
Alkaline Phosphatase: 69 U/L (ref 38–126)
Anion gap: 7 (ref 5–15)
BUN: 27 mg/dL — ABNORMAL HIGH (ref 8–23)
CO2: 31 mmol/L (ref 22–32)
Calcium: 8.1 mg/dL — ABNORMAL LOW (ref 8.9–10.3)
Chloride: 100 mmol/L (ref 98–111)
Creatinine, Ser: 1.17 mg/dL — ABNORMAL HIGH (ref 0.44–1.00)
GFR calc Af Amer: 55 mL/min — ABNORMAL LOW (ref >60)
GFR calc non Af Amer: 48 mL/min — ABNORMAL LOW (ref >60)
Glucose, Bld: 223 mg/dL — ABNORMAL HIGH (ref 70–99)
Potassium: 3.9 mmol/L (ref 3.5–5.1)
Sodium: 138 mmol/L (ref 135–145)
Total Bilirubin: 0.4 mg/dL (ref 0.3–1.2)
Total Protein: 5.7 g/dL — ABNORMAL LOW (ref 6.5–8.1)

## 2018-10-24 LAB — GLUCOSE, CAPILLARY
Glucose-Capillary: 149 mg/dL — ABNORMAL HIGH (ref 70–99)
Glucose-Capillary: 207 mg/dL — ABNORMAL HIGH (ref 70–99)
Glucose-Capillary: 194 mg/dL — ABNORMAL HIGH (ref 70–99)
Glucose-Capillary: 205 mg/dL — ABNORMAL HIGH (ref 70–99)

## 2018-10-24 LAB — CBC WITH DIFFERENTIAL/PLATELET
Abs Immature Granulocytes: 0.01 10*3/uL (ref 0.00–0.07)
Basophils Absolute: 0 10*3/uL (ref 0.0–0.1)
Basophils Relative: 1 %
Eosinophils Absolute: 0 10*3/uL (ref 0.0–0.5)
Eosinophils Relative: 1 %
HCT: 23.2 % — ABNORMAL LOW (ref 36.0–46.0)
Hemoglobin: 7.2 g/dL — ABNORMAL LOW (ref 12.0–15.0)
Immature Granulocytes: 0 %
Lymphocytes Relative: 46 %
Lymphs Abs: 1.1 10*3/uL (ref 0.7–4.0)
MCH: 29.3 pg (ref 26.0–34.0)
MCHC: 31 g/dL (ref 30.0–36.0)
MCV: 94.3 fL (ref 80.0–100.0)
Monocytes Absolute: 0.2 10*3/uL (ref 0.1–1.0)
Monocytes Relative: 10 %
Neutro Abs: 1 10*3/uL — ABNORMAL LOW (ref 1.7–7.7)
Neutrophils Relative %: 42 %
Platelets: 102 10*3/uL — ABNORMAL LOW (ref 150–400)
RBC: 2.46 MIL/uL — ABNORMAL LOW (ref 3.87–5.11)
RDW: 21.8 % — ABNORMAL HIGH (ref 11.5–15.5)
WBC: 2.4 10*3/uL — ABNORMAL LOW (ref 4.0–10.5)
nRBC: 0 % (ref 0.0–0.2)

## 2018-10-24 MED ORDER — SODIUM CHLORIDE 0.9 % IV SOLN
INTRAVENOUS | Status: DC | PRN
  Administered 2018-10-24 – 2018-10-25 (×2): 250 mL via INTRAVENOUS

## 2018-10-24 MED ORDER — TBO-FILGRASTIM 300 MCG/0.5ML ~~LOC~~ SOSY
300.0000 ug | PREFILLED_SYRINGE | Freq: Every day | SUBCUTANEOUS | Status: DC
  Administered 2018-10-24: 300 ug via SUBCUTANEOUS
  Filled 2018-10-24: qty 0.5

## 2018-10-24 NOTE — Progress Notes (Signed)
PROGRESS NOTE  Stephanie Combs:[id number] DOB: Apr 14, 1951 DOA: 10/11/2018 PCP: [name], MD   LOS: 12 days   Brief Narrative / Interim history: 68 year old female with history of chronic diastolic CHF, EF 76%, small cell lung cancer as well as colon cancer currently on chemotherapy under the care of Dr. Burr Medico, anxiety, COPD on 2 L nasal cannula at baseline, diabetes mellitus who came in to [facility] long ED on 10/11/2018 with 10 pound weight gain, fluid overload, as well as subjective shortness of breath.  She tested positive for SARS-CoV2.  Chest x-ray on admission without any evidence of multifocal pneumonia or consolidation.  It did show mild vascular congestion with cardiomegaly  Subjective: Complains of generalized weakness and intermittent abdominal cramping.  Still has poor appetite.  Her buttock is sore after IND yesterday by surgery.  Denies any fever or chills.  Assessment & Plan: Principal Problem:   Acute on chronic diastolic CHF (congestive heart failure) (HCC) Active Problems:   COPD (chronic obstructive pulmonary disease) (HCC)   Morbid obesity (HCC)   DM type 2 (diabetes mellitus, type 2) (HCC)   CKD (chronic kidney disease), stage III (HCC)   Hypothyroidism   Anemia in CKD (chronic kidney disease)   Anxiety   Chronic respiratory failure with hypoxia (HCC)   CAD (coronary artery disease)   Chronic pain   Small cell lung cancer, left upper lobe (HCC)   Acute on chronic diastolic heart failure (HCC)   Principal Problem Acute on chronic diastolic heart failure with chronic hypoxic respiratory failure -Patient was placed on IV Lasix which was held on 10/19/2018 due to slight bump in creatinine. -Continue Lasix, net -12.5 L, with overall improved from 331 on admission to 291 -Renal function is stable, keep on Lasix  Filed Weights   10/19/18 0451 10/22/18 0500 10/23/18 0500  Weight: (!) 140.9 kg 124.1 kg 132 kg   Active Problems Covid gastroenteritis -Patient with  intractable nausea, as well as abdominal discomfort when she eats.  Continue supportive treatment -Chemotherapy contributing, added Megace on 6/13.  Continue supportive treatment  COVID-19 Labs  Recent Labs    10/22/18 0400 10/23/18 0500  DDIMER 0.88* 0.81*  FERRITIN 918* 908*  LDH 257*  --   CRP 7.3* 7.4*    Lab Results  Component Value Date   SARSCOV2NAA POSITIVE (A) 10/11/2018   SARS-CoV-2 positive -No clinical evidence of pneumonia, her respiratory status is stable, she is on 2 L nasal cannula which is at her baseline -Inflammatory markers overall stable, will recheck tomorrow  Buttock abscess -Apparent of the spontaneous drainage on 6/13.  General surgery consulted, appreciate Dr. Brantley Stage who evaluated patient and had a bedside I&D -Started clindamycin, will continue for 7 to 10 days  Pancytopenia / Anemia of chronic disease / Thrombocytopenia -Likely multifactorial in the setting of underlying malignancies as well as chronic kidney disease.  She did receive a unit of packed red blood cells for hemoglobin of less than 7 on 6/5 and repeat transfusion on 6/12 -Hemoglobin stable this morning -ANC borderline 1.0K, will start Granix up until reaches above 1.5K -Platelets started to improve  Chronic kidney disease stage III -Creatinine varies between 1.2-1.5, -Creatinine remains stable, continue diuresis  Lab Results  Component Value Date   CREATININE 1.17 (H) 10/24/2018   CREATININE 1.05 (H) 10/23/2018   CREATININE 1.22 (H) 10/22/2018    Type 2 diabetes mellitus -Given poor p.o. intake her Levemir was decreased from 30 to 10 units, CBGs stable  CBG (last  3)  Recent Labs    10/23/18 1530 10/23/18 2122 10/24/18 0731  GLUCAP 231* 167* 149*    Small cell lung cancer/colon cancer -Followed by Dr. Burr Medico as an outpatient colon cancer was initially diagnosed in April 2020 and and lung cancer was found in May 2020 during her work-up for colon cancer.  Currently on  chemotherapy, his most recent cycle #2 on 10/11/2018 -Febrile on 6/11, cultures were sent and no growth to date.  Fever likely related to her buttock abscess  Coronary artery disease /hyperlipidemia -No chest pain, this appears to be asymptomatic  Hypertension -Blood pressure controlled  COPD with chronic hypoxic respiratory failure -Stable on 2 L, she is on chronic oxygen at home.  No new respiratory issues in the last day  Hypothyroidism -Continue Synthroid  Rheumatoid arthritis -Continue home medications  Depression/anxiety -Continue home medications  Morbid obesity -BMI 50    Scheduled Meds: . aspirin EC  81 mg Oral Daily  . cholecalciferol  5,000 Units Oral Daily  . docusate sodium  300 mg Oral Daily  . enoxaparin (LOVENOX) injection  70 mg Subcutaneous Q24H  . furosemide  40 mg Intravenous Daily  . gabapentin  600 mg Oral TID  . Gerhardt's butt cream   Topical TID  . insulin aspart  0-20 Units Subcutaneous TID WC  . insulin aspart  0-5 Units Subcutaneous QHS  . insulin detemir  10 Units Subcutaneous QHS  . levothyroxine  200 mcg Oral Daily  . megestrol  40 mg Oral Daily  . nystatin   Topical TID  . pantoprazole  40 mg Oral BID  . polyethylene glycol  17 g Oral BID  . senna  2 tablet Oral BID WC  . Tbo-filgastrim (GRANIX) SQ  300 mcg Subcutaneous q1800  . topiramate  75 mg Oral QHS  . umeclidinium-vilanterol  1 puff Inhalation Daily  . vitamin C  500 mg Oral BID  . zinc sulfate  220 mg Oral Daily   Continuous Infusions: . clindamycin (CLEOCIN) IV 600 mg (10/24/18 0609)   PRN Meds:.acetaminophen, ALPRAZolam, diphenhydrAMINE-zinc acetate, ondansetron (ZOFRAN) IV, oxyCODONE-acetaminophen **AND** oxyCODONE, tiZANidine  DVT prophylaxis: Lovenox Code Status: DNR Family Communication: d/w patient in the room Disposition Plan: home when ready  Consultants:   None   Procedures:   None   Antimicrobials:  None    Objective: Vitals:   10/23/18 1800  10/23/18 1900 10/23/18 1920 10/24/18 0733  BP:   127/68 (!) 117/44  Pulse: 75 65    Resp:    (!) 22  Temp:   98.2 F (36.8 C) 97.9 F (36.6 C)  TempSrc:   Oral Oral  SpO2:      Weight:      Height:        Intake/Output Summary (Last 24 hours) at 10/24/2018 0935 Last data filed at 10/24/2018 0600 Gross per 24 hour  Intake 50.14 ml  Output 700 ml  Net -649.86 ml   Filed Weights   10/19/18 0451 10/22/18 0500 10/23/18 0500  Weight: (!) 140.9 kg 124.1 kg 132 kg    Examination:  Constitutional: No distress Eyes: No icterus seen ENMT: Moist mucous membranes Neck: normal, supple Respiratory: Clear to auscultation bilaterally without wheezing or crackles Cardiovascular: Regular rate and rhythm, no murmurs.  1+ pitting lower extremity edema Abdomen: Soft, nontender, nondistended, bowel sounds positive Musculoskeletal: no clubbing / cyanosis.  Skin: No new rashes Neurologic: Nonfocal, equal strength Psychiatric: Normal judgment and insight. Alert and oriented x 3. Normal mood.  Data Reviewed: I have independently reviewed following labs and imaging studies   CBC: Recent Labs  Lab 10/20/18 0511 10/21/18 0300 10/22/18 0400 10/23/18 0500 10/24/18 0325  WBC 2.3* 2.2* 2.5* 2.7* 2.4*  NEUTROABS 1.3*  --  1.2* 1.4* 1.0*  HGB 7.0* 7.2* 6.7* 7.5* 7.2*  HCT 22.8* 23.6* 22.1* 24.5* 23.2*  MCV 94.6 94.4 94.4 93.9 94.3  PLT 159 132* 104* 99* 426*   Basic Metabolic Panel: Recent Labs  Lab 10/18/18 0540 10/20/18 0511 10/21/18 0300 10/22/18 0400 10/23/18 0500 10/24/18 0325  NA 138 138 139 138 139 138  K 3.6 3.6 3.5 3.5 4.3 3.9  CL 95* 96* 98 95* 98 100  CO2 35* 34* 32 33* 30 31  GLUCOSE 118* 178* 95 171* 142* 223*  BUN 34* 33* 27* 32* 27* 27*  CREATININE 1.29* 1.26* 1.06* 1.22* 1.05* 1.17*  CALCIUM 7.8* 7.8* 7.9* 7.6* 8.1* 8.1*  MG 1.8  --   --   --   --   --   PHOS 3.2  --   --   --   --   --    GFR: Estimated Creatinine Clearance: 59.7 mL/min (A) (by C-G formula  based on SCr of 1.17 mg/dL (H)). Liver Function Tests: Recent Labs  Lab 10/18/18 0540 10/20/18 0511 10/21/18 0300 10/22/18 0400 10/24/18 0325  AST 15 16 19 23 26   ALT 8 10 10 12 18   ALKPHOS 72 65 62 65 69  BILITOT 0.3 0.2* 0.4 <0.1* 0.4  PROT 5.5* 5.5* 5.6* 5.4* 5.7*  ALBUMIN 2.6* 2.5* 2.7* 2.5* 2.5*   No results for input(s): LIPASE, AMYLASE in the last 168 hours. No results for input(s): AMMONIA in the last 168 hours. Coagulation Profile: No results for input(s): INR, PROTIME in the last 168 hours. Cardiac Enzymes: No results for input(s): CKTOTAL, CKMB, CKMBINDEX, TROPONINI in the last 168 hours. BNP (last 3 results) No results for input(s): PROBNP in the last 8760 hours. HbA1C: No results for input(s): HGBA1C in the last 72 hours. CBG: Recent Labs  Lab 10/23/18 0807 10/23/18 1231 10/23/18 1530 10/23/18 2122 10/24/18 0731  GLUCAP 157* 220* 231* 167* 149*   Lipid Profile: No results for input(s): CHOL, HDL, LDLCALC, TRIG, CHOLHDL, LDLDIRECT in the last 72 hours. Thyroid Function Tests: No results for input(s): TSH, T4TOTAL, FREET4, T3FREE, THYROIDAB in the last 72 hours. Anemia Panel: Recent Labs    10/22/18 0400 10/23/18 0500  FERRITIN 918* 908*   Urine analysis:    Component Value Date/Time   COLORURINE YELLOW 04/05/2018 1559   APPEARANCEUR HAZY (A) 04/05/2018 1559   LABSPEC >1.030 (H) 04/05/2018 1559   PHURINE 5.5 04/05/2018 1559   GLUCOSEU NEGATIVE 04/05/2018 1559   HGBUR SMALL (A) 04/05/2018 1559   BILIRUBINUR SMALL (A) 04/05/2018 1559   KETONESUR 15 (A) 04/05/2018 1559   PROTEINUR 100 (A) 04/05/2018 1559   UROBILINOGEN 0.2 12/04/2014 0650   NITRITE NEGATIVE 04/05/2018 1559   LEUKOCYTESUR SMALL (A) 04/05/2018 1559   Sepsis Labs: Invalid input(s): PROCALCITONIN, LACTICIDVEN  Recent Results (from the past 240 hour(s))  Culture, blood (Routine X 2) w Reflex to ID Panel     Status: None (Preliminary result)   Collection Time: 10/21/18 12:20 PM    Specimen: BLOOD  Result Value Ref Range Status   Specimen Description   Final    BLOOD RIGHT HAND Performed at [facility], [place] [address], [place], [place] [address]    Special Requests   Final  BOTTLES DRAWN AEROBIC ONLY Blood Culture adequate volume Performed at [facility] [address], [place], [place] [address]    Culture   Final    NO GROWTH 3 DAYS Performed at [facility], [place] [address] [place], [place] [address]    Report Status PENDING  Incomplete  Culture, blood (Routine X 2) w Reflex to ID Panel     Status: None (Preliminary result)   Collection Time: 10/21/18 12:25 PM   Specimen: BLOOD  Result Value Ref Range Status   Specimen Description   Final    BLOOD LEFT HAND Performed at [facility] [address], [place], [place] [address]    Special Requests   Final    BOTTLES DRAWN AEROBIC ONLY Blood Culture adequate volume Performed at [facility] [address], [place], [place] [address]    Culture   Final    NO GROWTH 3 DAYS Performed at [facility], [place] [address] [place], [place] [address]    Report Status PENDING  Incomplete      Radiology Studies: No results found.   Marzetta Board, MD, PhD [facility]  Contact via  [email or web address]  [place] P: [phone]  F: [phone]

## 2018-10-25 ENCOUNTER — Inpatient Hospital Stay: Payer: Medicare Other

## 2018-10-25 ENCOUNTER — Inpatient Hospital Stay: Payer: Medicare Other | Admitting: Nurse Practitioner

## 2018-10-25 ENCOUNTER — Ambulatory Visit: Payer: Medicare Other

## 2018-10-25 ENCOUNTER — Inpatient Hospital Stay (HOSPITAL_COMMUNITY): Payer: Medicare Other

## 2018-10-25 LAB — COMPREHENSIVE METABOLIC PANEL
ALT: 27 U/L (ref 0–44)
AST: 31 U/L (ref 15–41)
Albumin: 2.6 g/dL — ABNORMAL LOW (ref 3.5–5.0)
Alkaline Phosphatase: 71 U/L (ref 38–126)
Anion gap: 10 (ref 5–15)
BUN: 21 mg/dL (ref 8–23)
CO2: 30 mmol/L (ref 22–32)
Calcium: 8.7 mg/dL — ABNORMAL LOW (ref 8.9–10.3)
Chloride: 100 mmol/L (ref 98–111)
Creatinine, Ser: 0.99 mg/dL (ref 0.44–1.00)
GFR calc Af Amer: >60 mL/min (ref >60)
GFR calc non Af Amer: 59 mL/min — ABNORMAL LOW (ref >60)
Glucose, Bld: 171 mg/dL — ABNORMAL HIGH (ref 70–99)
Potassium: 3.4 mmol/L — ABNORMAL LOW (ref 3.5–5.1)
Sodium: 140 mmol/L (ref 135–145)
Total Bilirubin: 0.5 mg/dL (ref 0.3–1.2)
Total Protein: 5.5 g/dL — ABNORMAL LOW (ref 6.5–8.1)

## 2018-10-25 LAB — CBC WITH DIFFERENTIAL/PLATELET
Abs Immature Granulocytes: 0.17 10*3/uL — ABNORMAL HIGH (ref 0.00–0.07)
Basophils Absolute: 0 10*3/uL (ref 0.0–0.1)
Basophils Relative: 0 %
Eosinophils Absolute: 0.1 10*3/uL (ref 0.0–0.5)
Eosinophils Relative: 1 %
HCT: 22.7 % — ABNORMAL LOW (ref 36.0–46.0)
Hemoglobin: 6.9 g/dL — CL (ref 12.0–15.0)
Immature Granulocytes: 2 %
Lymphocytes Relative: 13 %
Lymphs Abs: 1.2 10*3/uL (ref 0.7–4.0)
MCH: 28.9 pg (ref 26.0–34.0)
MCHC: 30.4 g/dL (ref 30.0–36.0)
MCV: 95 fL (ref 80.0–100.0)
Monocytes Absolute: 0.6 10*3/uL (ref 0.1–1.0)
Monocytes Relative: 6 %
Neutro Abs: 7.3 10*3/uL (ref 1.7–7.7)
Neutrophils Relative %: 78 %
Platelets: 117 10*3/uL — ABNORMAL LOW (ref 150–400)
RBC: 2.39 MIL/uL — ABNORMAL LOW (ref 3.87–5.11)
RDW: 21.6 % — ABNORMAL HIGH (ref 11.5–15.5)
WBC: 9.3 10*3/uL (ref 4.0–10.5)
nRBC: 0 % (ref 0.0–0.2)

## 2018-10-25 LAB — GLUCOSE, CAPILLARY
Glucose-Capillary: 149 mg/dL — ABNORMAL HIGH (ref 70–99)
Glucose-Capillary: 191 mg/dL — ABNORMAL HIGH (ref 70–99)
Glucose-Capillary: 180 mg/dL — ABNORMAL HIGH (ref 70–99)
Glucose-Capillary: 183 mg/dL — ABNORMAL HIGH (ref 70–99)
Glucose-Capillary: 179 mg/dL — ABNORMAL HIGH (ref 70–99)

## 2018-10-25 LAB — C-REACTIVE PROTEIN: CRP: 3 mg/dL — ABNORMAL HIGH (ref <1.0)

## 2018-10-25 LAB — PREPARE RBC (CROSSMATCH)

## 2018-10-25 LAB — LACTATE DEHYDROGENASE: LDH: 204 U/L — ABNORMAL HIGH (ref 98–192)

## 2018-10-25 LAB — D-DIMER, QUANTITATIVE: D-Dimer, Quant: 0.86 ug/mL-FEU — ABNORMAL HIGH (ref 0.00–0.50)

## 2018-10-25 LAB — FERRITIN: Ferritin: 726 ng/mL — ABNORMAL HIGH (ref 11–307)

## 2018-10-25 MED ORDER — POTASSIUM CHLORIDE CRYS ER 20 MEQ PO TBCR
40.0000 meq | EXTENDED_RELEASE_TABLET | Freq: Two times a day (BID) | ORAL | Status: AC
  Administered 2018-10-25 (×2): 40 meq via ORAL
  Filled 2018-10-25 (×2): qty 2

## 2018-10-25 MED ORDER — SODIUM CHLORIDE 0.9% IV SOLUTION
Freq: Once | INTRAVENOUS | Status: AC
  Administered 2018-10-25: 13:00:00 via INTRAVENOUS

## 2018-10-25 MED ORDER — FUROSEMIDE 10 MG/ML IJ SOLN
40.0000 mg | Freq: Once | INTRAMUSCULAR | Status: AC
  Administered 2018-10-25: 40 mg via INTRAVENOUS
  Filled 2018-10-25: qty 4

## 2018-10-25 NOTE — Progress Notes (Signed)
OT Cancellation Note  Patient Details Name: Stephanie Combs MRN: [id number] DOB: 25-Feb-1951   Cancelled Treatment:    Reason Eval/Treat Not Completed: Medical issues which prohibited therapy. Awaiting doppler to rule out DVT.   Ramond Dial, OT/L   Acute OT Clinical Specialist Acute Rehabilitation Services Pager [phone] Office [phone]  10/25/2018, 12:52 PM

## 2018-10-25 NOTE — Progress Notes (Signed)
PT Cancellation Note  Patient Details Name: Stephanie Combs MRN: [id number] DOB: [birth year]   Cancelled Treatment:    Reason Eval/Treat Not Completed: Medical issues which prohibited therapy   Noted pt to have doppler of RLE to rule out DVT. Patient is on lovenox. Will monitor for results   Rexanne Mano, PT 10/25/2018, 12:05 PM

## 2018-10-25 NOTE — Plan of Care (Signed)
Dr. Text to inform of critical hemoglobin 6.9.

## 2018-10-25 NOTE — Plan of Care (Signed)
Facilities contacted, per patient request, to assist patient with obtaining certain channels on her tv. Facilities stated, "we're unable to provide additional channels outside the hospital package that's being offered."

## 2018-10-25 NOTE — Plan of Care (Signed)
Surgical I/D Pic sent to Dr. Ceasar Lund as requested as "secure". Confirmed successful transmission and pic deleted from my phone.

## 2018-10-25 NOTE — Progress Notes (Signed)
Appreciate Dr. Cruzita Lederer and his care for this patient.  RN has taken a photo of the patient's buttock wound.  This is in Dr. Arman Filter note.  The wound appears to be clean.  No further dressing changes needed.  Sitz bathes ordered to help keep this area clean.  Will continue to follow from a distance.  Will figure out appropriate follow up arrangements.  Stephanie Combs 11:17 AM 10/25/2018

## 2018-10-25 NOTE — Progress Notes (Signed)
PROGRESS NOTE  Stephanie Combs JKK:[id number] DOB: Jul 22, 1950 DOA: 10/11/2018 PCP: [name], MD   LOS: 13 days   Brief Narrative / Interim history: 68 year old female with history of chronic diastolic CHF, EF 71%, small cell lung cancer as well as colon cancer currently on chemotherapy under the care of Dr. Burr Medico, anxiety, COPD on 2 L nasal cannula at baseline, diabetes mellitus who came in to [facility] long ED on 10/11/2018 with 10 pound weight gain, fluid overload, as well as subjective shortness of breath.  She tested positive for SARS-CoV2.  Chest x-ray on admission without any evidence of multifocal pneumonia or consolidation.  It did show mild vascular congestion with cardiomegaly  Subjective: Does not feel too good today, complains of persistent nausea.  Feels sick to her stomach.  Also complains of more shortness of breath as well as right calf pain  Assessment & Plan: Principal Problem:   Acute on chronic diastolic CHF (congestive heart failure) (HCC) Active Problems:   COPD (chronic obstructive pulmonary disease) (HCC)   Morbid obesity (HCC)   DM type 2 (diabetes mellitus, type 2) (HCC)   CKD (chronic kidney disease), stage III (HCC)   Hypothyroidism   Anemia in CKD (chronic kidney disease)   Anxiety   Chronic respiratory failure with hypoxia (HCC)   CAD (coronary artery disease)   Chronic pain   Small cell lung cancer, left upper lobe (HCC)   Acute on chronic diastolic heart failure (HCC)   Principal Problem Acute on chronic diastolic heart failure with chronic hypoxic respiratory failure -Patient was placed on IV Lasix which was held on 10/19/2018 due to slight bump in creatinine. -Continue Lasix, net -13 L, with overall improved from 331 on admission to 296 -Renal function is stable, keep on Lasix  Filed Weights   10/22/18 0500 10/23/18 0500 10/25/18 0127  Weight: 124.1 kg 132 kg 134.3 kg   Active Problems Covid gastroenteritis -Patient with intractable nausea, as well  as abdominal discomfort when she eats.  Continue supportive treatment -Chemotherapy contributing, added Megace on 6/13.  Continue supportive treatment  COVID-19 Labs  Recent Labs    10/23/18 0500 10/25/18 0548  DDIMER 0.81* 0.86*  FERRITIN 908* 726*  LDH  --  204*  CRP 7.4* 3.0*    Lab Results  Component Value Date   SARSCOV2NAA POSITIVE (A) 10/11/2018   SARS-CoV-2 positive -No clinical evidence of pneumonia, her respiratory status is stable, she is on 2 L nasal cannula which is at her baseline -More congested in her chest in the last 24 hours, will obtain a chest x-ray today  Buttock abscess -Apparent of the spontaneous drainage on 6/13.  General surgery consulted, appreciate Dr. Brantley Stage who evaluated patient and had a bedside I&D -Started clindamycin, will continue for 7 to 10 days -Wound looks good today, see picture below  Pancytopenia / Anemia of chronic disease / Thrombocytopenia -Likely multifactorial in the setting of underlying malignancies as well as chronic kidney disease.  She did receive a unit of packed red blood cells for hemoglobin of less than 7 on 6/5 and repeat transfusion on 6/12 -Hemoglobin stable this morning -ANC borderline 1.0K on 6/14, received Granix, now Throop normalized and will discontinue -Repeat transfusion today for hemoglobin less than 7  Chronic kidney disease stage III -Creatinine varies between 1.2-1.5, currently at baseline 1.17 -Creatinine remains stable, continue diuresis  Lab Results  Component Value Date   CREATININE 0.99 10/25/2018   CREATININE 1.17 (H) 10/24/2018   CREATININE 1.05 (H) 10/23/2018  Type 2 diabetes mellitus -Given poor p.o. intake her Levemir was decreased from 30 to 10 units, CBGs are stable today  CBG (last 3)  Recent Labs    10/24/18 1638 10/24/18 2109 10/25/18 0801  GLUCAP 194* 205* 149*    Small cell lung cancer/colon cancer -Followed by Dr. Burr Medico as an outpatient colon cancer was initially  diagnosed in April 2020 and and lung cancer was found in May 2020 during her work-up for colon cancer.  Currently on chemotherapy, his most recent cycle #2 on 10/11/2018 -Febrile on 6/11, cultures were sent and no growth to date.  Fever likely related to her buttock abscess  Coronary artery disease /hyperlipidemia -No chest pain, this appears to be asymptomatic  Hypertension -Blood pressure controlled  COPD with chronic hypoxic respiratory failure -Stable on 2 L, she is on chronic oxygen at home.  -CXR today   Hypothyroidism -Continue Synthroid  Rheumatoid arthritis -Continue home medications  Depression/anxiety -Continue home medications  Morbid obesity -BMI 50    Scheduled Meds: . aspirin EC  81 mg Oral Daily  . cholecalciferol  5,000 Units Oral Daily  . docusate sodium  300 mg Oral Daily  . enoxaparin (LOVENOX) injection  70 mg Subcutaneous Q24H  . furosemide  40 mg Intravenous Daily  . gabapentin  600 mg Oral TID  . Gerhardt's butt cream   Topical TID  . insulin aspart  0-20 Units Subcutaneous TID WC  . insulin aspart  0-5 Units Subcutaneous QHS  . insulin detemir  10 Units Subcutaneous QHS  . levothyroxine  200 mcg Oral Daily  . megestrol  40 mg Oral Daily  . nystatin   Topical TID  . pantoprazole  40 mg Oral BID  . polyethylene glycol  17 g Oral BID  . senna  2 tablet Oral BID WC  . Tbo-filgastrim (GRANIX) SQ  300 mcg Subcutaneous q1800  . topiramate  75 mg Oral QHS  . umeclidinium-vilanterol  1 puff Inhalation Daily  . vitamin C  500 mg Oral BID  . zinc sulfate  220 mg Oral Daily   Continuous Infusions: . sodium chloride 250 mL (10/25/18 0508)  . clindamycin (CLEOCIN) IV 600 mg (10/25/18 0510)   PRN Meds:.sodium chloride, acetaminophen, ALPRAZolam, diphenhydrAMINE-zinc acetate, ondansetron (ZOFRAN) IV, oxyCODONE-acetaminophen **AND** oxyCODONE, tiZANidine  DVT prophylaxis: Lovenox Code Status: DNR Family Communication: d/w patient in the room  Disposition Plan: home when ready  Consultants:   None   Procedures:   None   Antimicrobials:  None    Objective: Vitals:   10/24/18 2300 10/25/18 0127 10/25/18 0424 10/25/18 0803  BP:   (!) 155/72   Pulse: 69  94   Resp:   (!) 23 (!) 22  Temp:   98.2 F (36.8 C) 98.8 F (37.1 C)  TempSrc:   Oral Oral  SpO2: 98%  99%   Weight:  134.3 kg    Height:        Intake/Output Summary (Last 24 hours) at 10/25/2018 1009 Last data filed at 10/25/2018 0425 Gross per 24 hour  Intake 768.81 ml  Output 1600 ml  Net -831.19 ml   Filed Weights   10/22/18 0500 10/23/18 0500 10/25/18 0127  Weight: 124.1 kg 132 kg 134.3 kg    Examination:  Constitutional: NAD Eyes: No scleral icterus ENMT: Moist mucous membranes Neck: normal, supple Respiratory: Clear to auscultation bilaterally, no wheezing or crackles Cardiovascular: Regular rate and rhythm, no murmurs appreciated.  1+ pitting edema Abdomen: Soft, NT, ND, positive bowel  sounds Musculoskeletal: no clubbing / cyanosis.  Skin: Buttock wound as below    Neurologic: Nonfocal, equal strength Psychiatric: Normal judgment and insight. Alert and oriented x 3. Normal mood.    Data Reviewed: I have independently reviewed following labs and imaging studies   CBC: Recent Labs  Lab 10/20/18 0511 10/21/18 0300 10/22/18 0400 10/23/18 0500 10/24/18 0325 10/25/18 0548  WBC 2.3* 2.2* 2.5* 2.7* 2.4* 9.3  NEUTROABS 1.3*  --  1.2* 1.4* 1.0* 7.3  HGB 7.0* 7.2* 6.7* 7.5* 7.2* 6.9*  HCT 22.8* 23.6* 22.1* 24.5* 23.2* 22.7*  MCV 94.6 94.4 94.4 93.9 94.3 95.0  PLT 159 132* 104* 99* 102* 007*   Basic Metabolic Panel: Recent Labs  Lab 10/21/18 0300 10/22/18 0400 10/23/18 0500 10/24/18 0325 10/25/18 0548  NA 139 138 139 138 140  K 3.5 3.5 4.3 3.9 3.4*  CL 98 95* 98 100 100  CO2 32 33* 30 31 30   GLUCOSE 95 171* 142* 223* 171*  BUN 27* 32* 27* 27* 21  CREATININE 1.06* 1.22* 1.05* 1.17* 0.99  CALCIUM 7.9* 7.6* 8.1* 8.1* 8.7*    GFR: Estimated Creatinine Clearance: 71.3 mL/min (by C-G formula based on SCr of 0.99 mg/dL). Liver Function Tests: Recent Labs  Lab 10/20/18 0511 10/21/18 0300 10/22/18 0400 10/24/18 0325 10/25/18 0548  AST 16 19 23 26 31   ALT 10 10 12 18 27   ALKPHOS 65 62 65 69 71  BILITOT 0.2* 0.4 <0.1* 0.4 0.5  PROT 5.5* 5.6* 5.4* 5.7* 5.5*  ALBUMIN 2.5* 2.7* 2.5* 2.5* 2.6*   No results for input(s): LIPASE, AMYLASE in the last 168 hours. No results for input(s): AMMONIA in the last 168 hours. Coagulation Profile: No results for input(s): INR, PROTIME in the last 168 hours. Cardiac Enzymes: No results for input(s): CKTOTAL, CKMB, CKMBINDEX, TROPONINI in the last 168 hours. BNP (last 3 results) No results for input(s): PROBNP in the last 8760 hours. HbA1C: No results for input(s): HGBA1C in the last 72 hours. CBG: Recent Labs  Lab 10/24/18 0731 10/24/18 1147 10/24/18 1638 10/24/18 2109 10/25/18 0801  GLUCAP 149* 207* 194* 205* 149*   Lipid Profile: No results for input(s): CHOL, HDL, LDLCALC, TRIG, CHOLHDL, LDLDIRECT in the last 72 hours. Thyroid Function Tests: No results for input(s): TSH, T4TOTAL, FREET4, T3FREE, THYROIDAB in the last 72 hours. Anemia Panel: Recent Labs    10/23/18 0500 10/25/18 0548  FERRITIN 908* 726*   Urine analysis:    Component Value Date/Time   COLORURINE YELLOW 04/05/2018 1559   APPEARANCEUR HAZY (A) 04/05/2018 1559   LABSPEC >1.030 (H) 04/05/2018 1559   PHURINE 5.5 04/05/2018 1559   GLUCOSEU NEGATIVE 04/05/2018 1559   HGBUR SMALL (A) 04/05/2018 1559   BILIRUBINUR SMALL (A) 04/05/2018 1559   KETONESUR 15 (A) 04/05/2018 1559   PROTEINUR 100 (A) 04/05/2018 1559   UROBILINOGEN 0.2 12/04/2014 0650   NITRITE NEGATIVE 04/05/2018 1559   LEUKOCYTESUR SMALL (A) 04/05/2018 1559   Sepsis Labs: Invalid input(s): PROCALCITONIN, LACTICIDVEN  Recent Results (from the past 240 hour(s))  Culture, blood (Routine X 2) w Reflex to ID Panel     Status:  None (Preliminary result)   Collection Time: 10/21/18 12:20 PM   Specimen: BLOOD  Result Value Ref Range Status   Specimen Description   Final    BLOOD RIGHT HAND Performed at [facility], [place] [address], [place], [place] [address]    Special Requests   Final    BOTTLES DRAWN AEROBIC ONLY Blood Culture  adequate volume Performed at [facility] [address], [place], [place] [address]    Culture   Final    NO GROWTH 3 DAYS Performed at [facility], [place] [address], [place], [place] [address]    Report Status PENDING  Incomplete  Culture, blood (Routine X 2) w Reflex to ID Panel     Status: None (Preliminary result)   Collection Time: 10/21/18 12:25 PM   Specimen: BLOOD  Result Value Ref Range Status   Specimen Description   Final    BLOOD LEFT HAND Performed at [facility] [address], [place], [place] [address]    Special Requests   Final    BOTTLES DRAWN AEROBIC ONLY Blood Culture adequate volume Performed at [facility] [address], [place], [place] [address]    Culture   Final    NO GROWTH 3 DAYS Performed at [facility], [place] [address], [place], [place] [address]    Report Status PENDING  Incomplete     Radiology Studies: No results found.  Marzetta Board, MD, PhD [facility]  Contact via  [email or web address]  [place] P: [phone]  F: [phone]

## 2018-10-26 ENCOUNTER — Inpatient Hospital Stay (HOSPITAL_COMMUNITY): Payer: Medicare Other

## 2018-10-26 ENCOUNTER — Ambulatory Visit: Payer: Medicare Other

## 2018-10-26 DIAGNOSIS — R52 Pain, unspecified: Secondary | ICD-10-CM

## 2018-10-26 DIAGNOSIS — M7989 Other specified soft tissue disorders: Secondary | ICD-10-CM

## 2018-10-26 LAB — TYPE AND SCREEN
ABO/RH(D): A POS
Antibody Screen: POSITIVE
Donor AG Type: NEGATIVE
Donor AG Type: NEGATIVE
Donor AG Type: NEGATIVE
Unit division: 0
Unit division: 0
Unit division: 0

## 2018-10-26 LAB — BASIC METABOLIC PANEL
Anion gap: 11 (ref 5–15)
BUN: 20 mg/dL (ref 8–23)
CO2: 29 mmol/L (ref 22–32)
Calcium: 8.6 mg/dL — ABNORMAL LOW (ref 8.9–10.3)
Chloride: 100 mmol/L (ref 98–111)
Creatinine, Ser: 1.01 mg/dL — ABNORMAL HIGH (ref 0.44–1.00)
GFR calc Af Amer: >60 mL/min (ref >60)
GFR calc non Af Amer: 57 mL/min — ABNORMAL LOW (ref >60)
Glucose, Bld: 150 mg/dL — ABNORMAL HIGH (ref 70–99)
Potassium: 4.7 mmol/L (ref 3.5–5.1)
Sodium: 140 mmol/L (ref 135–145)

## 2018-10-26 LAB — BPAM RBC
Blood Product Expiration Date: 202006232359
Blood Product Expiration Date: 202006242359
Blood Product Expiration Date: 202007012359
ISSUE DATE / TIME: 202006120945
ISSUE DATE / TIME: 202006151157
ISSUE DATE / TIME: 202006151157
Unit Type and Rh: 6200
Unit Type and Rh: 6200
Unit Type and Rh: 6200

## 2018-10-26 LAB — CBC WITH DIFFERENTIAL/PLATELET
Abs Immature Granulocytes: 0.9 10*3/uL — ABNORMAL HIGH (ref 0.00–0.07)
Basophils Absolute: 0 10*3/uL (ref 0.0–0.1)
Basophils Relative: 0 %
Eosinophils Absolute: 0.1 10*3/uL (ref 0.0–0.5)
Eosinophils Relative: 0 %
HCT: 28.2 % — ABNORMAL LOW (ref 36.0–46.0)
Hemoglobin: 8.8 g/dL — ABNORMAL LOW (ref 12.0–15.0)
Immature Granulocytes: 8 %
Lymphocytes Relative: 12 %
Lymphs Abs: 1.4 10*3/uL (ref 0.7–4.0)
MCH: 29.5 pg (ref 26.0–34.0)
MCHC: 31.2 g/dL (ref 30.0–36.0)
MCV: 94.6 fL (ref 80.0–100.0)
Monocytes Absolute: 0.7 10*3/uL (ref 0.1–1.0)
Monocytes Relative: 6 %
Neutro Abs: 8.7 10*3/uL — ABNORMAL HIGH (ref 1.7–7.7)
Neutrophils Relative %: 74 %
Platelets: 155 10*3/uL (ref 150–400)
RBC: 2.98 MIL/uL — ABNORMAL LOW (ref 3.87–5.11)
RDW: 20.1 % — ABNORMAL HIGH (ref 11.5–15.5)
WBC: 11.8 10*3/uL — ABNORMAL HIGH (ref 4.0–10.5)
nRBC: 0 % (ref 0.0–0.2)

## 2018-10-26 LAB — CULTURE, BLOOD (ROUTINE X 2)
Culture: NO GROWTH
Culture: NO GROWTH
Special Requests: ADEQUATE
Special Requests: ADEQUATE

## 2018-10-26 LAB — GLUCOSE, CAPILLARY
Glucose-Capillary: 152 mg/dL — ABNORMAL HIGH (ref 70–99)
Glucose-Capillary: 189 mg/dL — ABNORMAL HIGH (ref 70–99)
Glucose-Capillary: 209 mg/dL — ABNORMAL HIGH (ref 70–99)
Glucose-Capillary: 166 mg/dL — ABNORMAL HIGH (ref 70–99)

## 2018-10-26 MED ORDER — FUROSEMIDE 10 MG/ML IJ SOLN
40.0000 mg | Freq: Two times a day (BID) | INTRAMUSCULAR | Status: DC
  Administered 2018-10-26 – 2018-10-27 (×3): 40 mg via INTRAVENOUS
  Filled 2018-10-26 (×3): qty 4

## 2018-10-26 MED ORDER — ENSURE ENLIVE PO LIQD
237.0000 mL | Freq: Two times a day (BID) | ORAL | Status: DC
  Administered 2018-10-26: 237 mL via ORAL

## 2018-10-26 MED ORDER — PRO-STAT SUGAR FREE PO LIQD
30.0000 mL | Freq: Two times a day (BID) | ORAL | Status: DC
  Filled 2018-10-26 (×2): qty 30

## 2018-10-26 NOTE — Progress Notes (Signed)
Venous duplex lower ext  has been completed. Refer to [facility] under chart review to view preliminary results.   10/26/2018  11:12 AM Stephanie Combs, Bonnye Fava  ;

## 2018-10-26 NOTE — Progress Notes (Addendum)
PROGRESS NOTE  NAVI EWTON WNI:[id number] DOB: Apr 07, 1951 DOA: 10/11/2018 PCP: [name], MD   LOS: 14 days   Brief Narrative / Interim history: 68 year old female with history of chronic diastolic CHF, EF 38%, small cell lung cancer as well as colon cancer currently on chemotherapy under the care of Dr. Burr Medico, anxiety, COPD on 2 L nasal cannula at baseline, diabetes mellitus who came in to [facility] long ED on 10/11/2018 with 10 pound weight gain, fluid overload, as well as subjective shortness of breath.  She tested positive for SARS-CoV2.  Chest x-ray on admission without any evidence of multifocal pneumonia or consolidation.  It did show mild vascular congestion with cardiomegaly  Subjective: Complains of increased shortness of breath as well as congestion.  Also complains of abdominal cramping along with nausea.  Denies any fever or chills, no vomiting.  Denies any diarrhea.  Assessment & Plan: Principal Problem:   Acute on chronic diastolic CHF (congestive heart failure) (HCC) Active Problems:   COPD (chronic obstructive pulmonary disease) (HCC)   Morbid obesity (HCC)   DM type 2 (diabetes mellitus, type 2) (HCC)   CKD (chronic kidney disease), stage III (HCC)   Hypothyroidism   Anemia in CKD (chronic kidney disease)   Anxiety   Chronic respiratory failure with hypoxia (HCC)   CAD (coronary artery disease)   Chronic pain   Small cell lung cancer, left upper lobe (HCC)   Acute on chronic diastolic heart failure (HCC)   Principal Problem Acute on chronic diastolic heart failure with chronic hypoxic respiratory failure -Patient was placed on IV Lasix which was held on 10/19/2018 due to slight bump in creatinine. -Continue Lasix, net -13 L, with overall improved from 331 on admission to as low as 291 but now increased to 296, possibly related to the blood transfusions yesterday?  Although she did receive an extra Lasix -Chest x-ray obtained yesterday personally reviewed, developing a  small right pleural effusion as well as increased vascular congestion. -Renal function is stable, keep on Lasix, increased dose today given chest x-ray findings -Continue fluid restriction  Filed Weights   10/23/18 0500 10/25/18 0127 10/26/18 0218  Weight: 132 kg 134.3 kg 134.3 kg   Active Problems Covid gastroenteritis -Patient with intractable nausea, as well as abdominal discomfort when she eats.  Continue supportive treatment -Chemotherapy contributing, added Megace on 6/13.  Continue supportive treatment  COVID-19 Labs  Recent Labs    10/25/18 0548  DDIMER 0.86*  FERRITIN 726*  LDH 204*  CRP 3.0*    Lab Results  Component Value Date   SARSCOV2NAA POSITIVE (A) 10/11/2018   SARS-CoV-2 positive -No clinical evidence of pneumonia, her respiratory status is stable, she is on 2 L nasal cannula which is at her baseline -Respiratory status remains at baseline, chest x-ray findings likely due to fluid overload as above  Buttock abscess -Apparent of the spontaneous drainage on 6/13.  General surgery consulted, appreciate Dr. Brantley Stage who evaluated patient and had a bedside I&D -Started clindamycin, will continue for 7 to 10 days -Wound looks good, picture obtained yesterday showed improvement.  Discussed with general surgery.  Pancytopenia / Anemia of chronic disease / Thrombocytopenia -Likely multifactorial in the setting of underlying malignancies as well as chronic kidney disease.  She did receive a unit of packed red blood cells for hemoglobin of less than 7 on 6/5 and repeat transfusion on 6/12 -Hemoglobin stable this morning -ANC borderline 1.0K on 6/14, received Granix, now ANC normalized and will discontinue -Received 2  more units on 6/15, hemoglobin improved to 8.8 this morning, appropriate  Right leg pain -Ultrasound Doppler negative for DVT, preliminary read  Chronic kidney disease stage III -Creatinine varies between 1.2-1.5, currently at baseline of 1.01.   Continue diuresis with increased Lasix doses today  Lab Results  Component Value Date   CREATININE 1.01 (H) 10/26/2018   CREATININE 0.99 10/25/2018   CREATININE 1.17 (H) 10/24/2018    Type 2 diabetes mellitus -Given poor p.o. intake her Levemir was decreased from 30 to 10 units, CBGs remained stable  CBG (last 3)  Recent Labs    10/25/18 1925 10/25/18 2034 10/26/18 0823  GLUCAP 183* 179* 152*    Small cell lung cancer/colon cancer -Followed by Dr. Burr Medico as an outpatient colon cancer was initially diagnosed in April 2020 and and lung cancer was found in May 2020 during her work-up for colon cancer.  Currently on chemotherapy, his most recent cycle #2 on 10/11/2018 -Febrile on 6/11, cultures were sent.  Fever likely related to her buttock abscess -Cultures without growth  Coronary artery disease /hyperlipidemia -No chest pain, this appears to be asymptomatic  Hypertension -Blood pressure controlled  COPD with chronic hypoxic respiratory failure -Stable on 2 L, she is on chronic oxygen at home.  -CXR today   Hypothyroidism -Continue Synthroid  Rheumatoid arthritis -Continue home medications  Depression/anxiety -Continue home medications  Morbid obesity -BMI 50    Scheduled Meds:  aspirin EC  81 mg Oral Daily   cholecalciferol  5,000 Units Oral Daily   docusate sodium  300 mg Oral Daily   enoxaparin (LOVENOX) injection  70 mg Subcutaneous Q24H   furosemide  40 mg Intravenous Daily   gabapentin  600 mg Oral TID   Gerhardt's butt cream   Topical TID   insulin aspart  0-20 Units Subcutaneous TID WC   insulin aspart  0-5 Units Subcutaneous QHS   insulin detemir  10 Units Subcutaneous QHS   levothyroxine  200 mcg Oral Daily   megestrol  40 mg Oral Daily   nystatin   Topical TID   pantoprazole  40 mg Oral BID   polyethylene glycol  17 g Oral BID   senna  2 tablet Oral BID WC   topiramate  75 mg Oral QHS   umeclidinium-vilanterol  1 puff  Inhalation Daily   vitamin C  500 mg Oral BID   zinc sulfate  220 mg Oral Daily   Continuous Infusions:  sodium chloride 250 mL (10/25/18 0508)   clindamycin (CLEOCIN) IV 600 mg (10/26/18 0552)   PRN Meds:.sodium chloride, acetaminophen, ALPRAZolam, diphenhydrAMINE-zinc acetate, ondansetron (ZOFRAN) IV, oxyCODONE-acetaminophen **AND** oxyCODONE, tiZANidine  DVT prophylaxis: Lovenox Code Status: DNR Family Communication: d/w patient in the room Disposition Plan: home when ready  Consultants:   None   Procedures:   None   Antimicrobials:  None    Objective: Vitals:   10/25/18 2025 10/26/18 0218 10/26/18 0332 10/26/18 0825  BP: (!) 153/64  (!) 150/73   Pulse: 97  83   Resp: 20     Temp: 98.9 F (37.2 C)  99 F (37.2 C) 98.1 F (36.7 C)  TempSrc: Oral  Oral Oral  SpO2:   97%   Weight:  134.3 kg    Height:        Intake/Output Summary (Last 24 hours) at 10/26/2018 1139 Last data filed at 10/26/2018 0218 Gross per 24 hour  Intake 2127.04 ml  Output 600 ml  Net 1527.04 ml   [employer]  10/23/18 0500 10/25/18 0127 10/26/18 0218  Weight: 132 kg 134.3 kg 134.3 kg    Examination:  Constitutional: No distress Eyes: No icterus ENMT: Moist mucous membranes Neck: normal, supple Respiratory: Clear to auscultation bilaterally, no wheezing, no crackles.  Normal respiratory effort Cardiovascular: Regular rate and rhythm, no murmurs.  1+ pitting lower extremity edema. Abdomen: Soft, mild discomfort in the epigastric area but no guarding or rebound, bowel sounds positive Musculoskeletal: no clubbing / cyanosis.  Skin: Buttock wound not evaluated today Neurologic: Nonfocal, equal strength Psychiatric: Normal judgment and insight. Alert and oriented x 3. Normal mood.    Data Reviewed: I have independently reviewed following labs and imaging studies   CBC: Recent Labs  Lab 10/22/18 0400 10/23/18 0500 10/24/18 0325 10/25/18 0548 10/26/18 0356  WBC 2.5*  2.7* 2.4* 9.3 11.8*  NEUTROABS 1.2* 1.4* 1.0* 7.3 8.7*  HGB 6.7* 7.5* 7.2* 6.9* 8.8*  HCT 22.1* 24.5* 23.2* 22.7* 28.2*  MCV 94.4 93.9 94.3 95.0 94.6  PLT 104* 99* 102* 117* 314   Basic Metabolic Panel: Recent Labs  Lab 10/22/18 0400 10/23/18 0500 10/24/18 0325 10/25/18 0548 10/26/18 0356  NA 138 139 138 140 140  K 3.5 4.3 3.9 3.4* 4.7  CL 95* 98 100 100 100  CO2 33* 30 31 30 29   GLUCOSE 171* 142* 223* 171* 150*  BUN 32* 27* 27* 21 20  CREATININE 1.22* 1.05* 1.17* 0.99 1.01*  CALCIUM 7.6* 8.1* 8.1* 8.7* 8.6*   GFR: Estimated Creatinine Clearance: 69.9 mL/min (A) (by C-G formula based on SCr of 1.01 mg/dL (H)). Liver Function Tests: Recent Labs  Lab 10/20/18 0511 10/21/18 0300 10/22/18 0400 10/24/18 0325 10/25/18 0548  AST 16 19 23 26 31   ALT 10 10 12 18 27   ALKPHOS 65 62 65 69 71  BILITOT 0.2* 0.4 <0.1* 0.4 0.5  PROT 5.5* 5.6* 5.4* 5.7* 5.5*  ALBUMIN 2.5* 2.7* 2.5* 2.5* 2.6*   No results for input(s): LIPASE, AMYLASE in the last 168 hours. No results for input(s): AMMONIA in the last 168 hours. Coagulation Profile: No results for input(s): INR, PROTIME in the last 168 hours. Cardiac Enzymes: No results for input(s): CKTOTAL, CKMB, CKMBINDEX, TROPONINI in the last 168 hours. BNP (last 3 results) No results for input(s): PROBNP in the last 8760 hours. HbA1C: No results for input(s): HGBA1C in the last 72 hours. CBG: Recent Labs  Lab 10/25/18 1201 10/25/18 1647 10/25/18 1925 10/25/18 2034 10/26/18 0823  GLUCAP 191* 180* 183* 179* 152*   Lipid Profile: No results for input(s): CHOL, HDL, LDLCALC, TRIG, CHOLHDL, LDLDIRECT in the last 72 hours. Thyroid Function Tests: No results for input(s): TSH, T4TOTAL, FREET4, T3FREE, THYROIDAB in the last 72 hours. Anemia Panel: Recent Labs    10/25/18 0548  FERRITIN 726*   Urine analysis:    Component Value Date/Time   COLORURINE YELLOW 04/05/2018 1559   APPEARANCEUR HAZY (A) 04/05/2018 1559   LABSPEC >1.030  (H) 04/05/2018 1559   PHURINE 5.5 04/05/2018 1559   GLUCOSEU NEGATIVE 04/05/2018 1559   HGBUR SMALL (A) 04/05/2018 1559   BILIRUBINUR SMALL (A) 04/05/2018 1559   KETONESUR 15 (A) 04/05/2018 1559   PROTEINUR 100 (A) 04/05/2018 1559   UROBILINOGEN 0.2 12/04/2014 0650   NITRITE NEGATIVE 04/05/2018 1559   LEUKOCYTESUR SMALL (A) 04/05/2018 1559   Sepsis Labs: Invalid input(s): PROCALCITONIN, LACTICIDVEN  Recent Results (from the past 240 hour(s))  Culture, blood (Routine X 2) w Reflex to ID Panel     Status: None (Preliminary result)  Collection Time: 10/21/18 12:20 PM   Specimen: BLOOD  Result Value Ref Range Status   Specimen Description   Final    BLOOD RIGHT HAND Performed at [facility] [address], [place], [place] [address]    Special Requests   Final    BOTTLES DRAWN AEROBIC ONLY Blood Culture adequate volume Performed at [facility] [address], [place], [place] [address]    Culture   Final    NO GROWTH 3 DAYS Performed at [facility], [place] [address] [place], [place] [address]    Report Status PENDING  Incomplete  Culture, blood (Routine X 2) w Reflex to ID Panel     Status: None (Preliminary result)   Collection Time: 10/21/18 12:25 PM   Specimen: BLOOD  Result Value Ref Range Status   Specimen Description   Final    BLOOD LEFT HAND Performed at [facility] [address], [place], [place] [address]    Special Requests   Final    BOTTLES DRAWN AEROBIC ONLY Blood Culture adequate volume Performed at [facility] [address] [place], [place] [address]    Culture   Final    NO GROWTH 3 DAYS Performed at [facility], [place] [address], [place], [place] [address]    Report Status PENDING  Incomplete     Radiology Studies: Dg Chest Port 1 View  Result Date: 10/25/2018 CLINICAL DATA:  Shortness of breath EXAM: PORTABLE CHEST 1 VIEW COMPARISON:  10/11/2018  FINDINGS: Small right pleural effusion. Bilateral interstitial thickening. No pneumothorax. Stable cardiomegaly. No acute osseous abnormality. IMPRESSION: Small right pleural effusion bilateral interstitial thickening. Differential considerations include mild pulmonary edema versus atypical infection. Electronically Signed   By: Kathreen Devoid   On: 10/25/2018 10:59   Vas [place] Lower Extremity Venous (dvt)  Result Date: 10/26/2018  Lower Venous Study Indications: Swelling, and Pain. Other Indications: Positive Covid patient with lung and colon cancer. Anticoagulation: Lovenox. Limitations: Body habitus. Comparison Study: No prior. Performing Technologist: Oda Cogan RDMS, RVT  Examination Guidelines: A complete evaluation includes B-mode imaging, spectral Doppler, color Doppler, and power Doppler as needed of all accessible portions of each vessel. Bilateral testing is considered an integral part of a complete examination. Limited examinations for reoccurring indications may be performed as noted.  +---------+---------------+---------+-----------+----------+--------------+  RIGHT     Compressibility Phasicity Spontaneity Properties Summary         +---------+---------------+---------+-----------+----------+--------------+  CFV       Full            Yes       Yes                                    +---------+---------------+---------+-----------+----------+--------------+  SFJ       Full                                                             +---------+---------------+---------+-----------+----------+--------------+  FV Prox   Full                                                             +---------+---------------+---------+-----------+----------+--------------+  FV Mid    Full                                                             +---------+---------------+---------+-----------+----------+--------------+  FV Distal Full                                                              +---------+---------------+---------+-----------+----------+--------------+  PFV       Full                                                             +---------+---------------+---------+-----------+----------+--------------+  POP       Full            Yes       Yes                                    +---------+---------------+---------+-----------+----------+--------------+  PTV                                                        Not visualized  +---------+---------------+---------+-----------+----------+--------------+  PERO                                                       Not visualized  +---------+---------------+---------+-----------+----------+--------------+   +---------+---------------+---------+-----------+----------+--------------+  LEFT      Compressibility Phasicity Spontaneity Properties Summary         +---------+---------------+---------+-----------+----------+--------------+  CFV       Full            Yes       Yes                                    +---------+---------------+---------+-----------+----------+--------------+  SFJ       Full                                                             +---------+---------------+---------+-----------+----------+--------------+  FV Prox   Full                                                             +---------+---------------+---------+-----------+----------+--------------+  FV Mid    Full                                                             +---------+---------------+---------+-----------+----------+--------------+  FV Distal Full                                                             +---------+---------------+---------+-----------+----------+--------------+  PFV       Full                                                             +---------+---------------+---------+-----------+----------+--------------+  POP       Full            Yes       Yes                                     +---------+---------------+---------+-----------+----------+--------------+  PTV                                                        Not visualized  +---------+---------------+---------+-----------+----------+--------------+  PERO                                                       Not visualized  +---------+---------------+---------+-----------+----------+--------------+    Summary: Right: There is no evidence of deep vein thrombosis in the lower extremity. However, portions of this examination were limited- see technologist comments above. Poorly visualized calf veins bilaterally cannot exclude DVT at this level. Left: There is no evidence of deep vein thrombosis in the lower extremity. However, portions of this examination were limited- see technologist comments above.  *See table(s) above for measurements and observations.    Preliminary     Marzetta Board, MD, PhD [facility]  Contact via  [email or web address]  [place] P: [phone]  F: [phone]

## 2018-10-26 NOTE — Progress Notes (Signed)
OT Cancellation Note  Patient Details Name: KYILEE GREGG MRN: [id number] DOB: [birth year]   Cancelled Treatment:    Reason Eval/Treat Not Completed: Other (comment). Pt with multiple reasons why she could not participate with therapy this am. Will attempt this pm as time allows.  Ramond Dial, OT/L   Acute OT Clinical Specialist Acute Rehabilitation Services Pager [phone] Office [phone]  10/26/2018, 1:11 PM

## 2018-10-26 NOTE — Care Management Important Message (Signed)
Important Message  Patient Details  Name: Stephanie Combs MRN: [id number] Date of Birth: 03/28/1951   Medicare Important Message Given:  Yes  IM mailed on 10/26/2018 by CMA , to emergency contact on file  Matan Steen [address] 10/26/2018, 4:05 PM

## 2018-10-26 NOTE — Progress Notes (Signed)
PT Cancellation Note  Patient Details Name: Stephanie Combs MRN: [id number] DOB: 16-Dec-1950   Cancelled Treatment:    Reason Eval/Treat Not Completed: Patient declined  Too tired after being up in chair. Wants to wait for results of LE dopplers   Rexanne Mano, PT 10/26/2018, 12:02 PM

## 2018-10-26 NOTE — Progress Notes (Signed)
 Initial Nutrition Assessment   RD working remotely.   DOCUMENTATION CODES:   Morbid obesity  INTERVENTION:   Recommend liberalizing diet to promote po intake  Ensure Enlive po BID, each supplement provides 350 kcal and 20 grams of protein  30 ml Prostat BID, each supplement provides 100 kcals and 15 grams protein.    NUTRITION DIAGNOSIS:   Inadequate oral intake related to acute illness, nausea, poor appetite as evidenced by per patient/family report, meal completion < 25%.  GOAL:   Patient will meet greater than or equal to 90% of their needs  MONITOR:   PO intake, Supplement acceptance, Labs, Weight trends, Skin, I & O's  REASON FOR ASSESSMENT:   LOS    ASSESSMENT:   68 yo female admitted with acute on chronic CHF, COVID-19 gastroenteritis, buttock abscess s/p I&D. PMH includes COPD, morbid obesity, DM, CKD, CAD, CHF, small cell lung cancer   Recorded po intake <25% since admission, poor appetite.  Pt with intractable nausea and abdominal discomfort with po intake.   Admission weight 150.2. Kg; current wt 134.3 kg. Net negative 11 L per I/O flow sheet attributing to some but not all of patient's weight loss  Pt weak, did not want to participate in therapy today  Noted in May 2020, pt evaluated by RD and eating well at that time. EGD on 4/30 revealed colon mass, CT revealed lung mass. Pt seen by palliative care and made DNR/DNI  Labs: CBGs 152-189, Creatinine 1.01, BUN wdl Meds: Vitamin D, lasix, ss novolog, levemir, Megace, Vit C, zinc sulfate   NUTRITION - FOCUSED PHYSICAL EXAM:  Unable to assess  Diet Order:   Diet Order            Diet heart healthy/carb modified Room service appropriate? Yes; Fluid consistency: Thin; Fluid restriction: 1500 mL Fluid  Diet effective now              EDUCATION NEEDS:   Not appropriate for education at this time  Skin:  Skin Assessment: Skin Integrity Issues: Skin Integrity Issues:: Other (Comment) Other:  buttock abcess s/p I&D, open wound in abdominal fold  Last BM:  6/16  Height:   Ht Readings from Last 1 Encounters:  10/11/18 5' 1.5" (1.562 m)    Weight:   Wt Readings from Last 1 Encounters:  10/26/18 134.3 kg    Ideal Body Weight:  49 kg  BMI:  Body mass index is 55.05 kg/m.  Estimated Nutritional Needs:   Kcal:  2000-2200 kcals  Protein:  120-140 g  Fluid:  >/= 1.5 L     Gavyn Ybarra MS, RDN, LDN, CNSC [phone] Pager  [phone] Weekend/On-Call Pager

## 2018-10-26 NOTE — Progress Notes (Signed)
PHARMACY CONSULT: Lovenox for VTE prophylaxis COVID protocol  Wt: 134.3 kg BMI:  55 Scr:  1.01 CrCl >30 ml/hr  Hgb: decreased to 6.9 yesterday, now increased to 8.8 s/p 2 units PRBC on 6/15 No bleeding or complications reported Pltc increased to 155 D-dimer: 0.86 Venous duplex lower ext completed - no DVT noted, but exam limited.  A/P:  Due to BMI > 35, continue weight-adjusted lovenox 0.5mg /kg sq q24h  Monitor weight, CBC and renal function, adjust as needed   Gretta Arab PharmD, BCPS Clinical Pharmacist Clinical pharmacist phone 7am- 5pm: [phone] 10/26/2018 2:06 PM

## 2018-10-27 ENCOUNTER — Ambulatory Visit: Payer: Medicare Other

## 2018-10-27 LAB — GLUCOSE, CAPILLARY
Glucose-Capillary: 146 mg/dL — ABNORMAL HIGH (ref 70–99)
Glucose-Capillary: 188 mg/dL — ABNORMAL HIGH (ref 70–99)
Glucose-Capillary: 201 mg/dL — ABNORMAL HIGH (ref 70–99)
Glucose-Capillary: 313 mg/dL — ABNORMAL HIGH (ref 70–99)

## 2018-10-27 LAB — COMPREHENSIVE METABOLIC PANEL
ALT: 35 U/L (ref 0–44)
AST: 29 U/L (ref 15–41)
Albumin: 3 g/dL — ABNORMAL LOW (ref 3.5–5.0)
Alkaline Phosphatase: 91 U/L (ref 38–126)
Anion gap: 11 (ref 5–15)
BUN: 14 mg/dL (ref 8–23)
CO2: 29 mmol/L (ref 22–32)
Calcium: 8.7 mg/dL — ABNORMAL LOW (ref 8.9–10.3)
Chloride: 99 mmol/L (ref 98–111)
Creatinine, Ser: 1.13 mg/dL — ABNORMAL HIGH (ref 0.44–1.00)
GFR calc Af Amer: 58 mL/min — ABNORMAL LOW (ref >60)
GFR calc non Af Amer: 50 mL/min — ABNORMAL LOW (ref >60)
Glucose, Bld: 158 mg/dL — ABNORMAL HIGH (ref 70–99)
Potassium: 4.2 mmol/L (ref 3.5–5.1)
Sodium: 139 mmol/L (ref 135–145)
Total Bilirubin: 0.5 mg/dL (ref 0.3–1.2)
Total Protein: 6.2 g/dL — ABNORMAL LOW (ref 6.5–8.1)

## 2018-10-27 LAB — CBC WITH DIFFERENTIAL/PLATELET
Abs Immature Granulocytes: 0.15 10*3/uL — ABNORMAL HIGH (ref 0.00–0.07)
Basophils Absolute: 0 10*3/uL (ref 0.0–0.1)
Basophils Relative: 0 %
Eosinophils Absolute: 0.1 10*3/uL (ref 0.0–0.5)
Eosinophils Relative: 1 %
HCT: 31 % — ABNORMAL LOW (ref 36.0–46.0)
Hemoglobin: 9.5 g/dL — ABNORMAL LOW (ref 12.0–15.0)
Immature Granulocytes: 2 %
Lymphocytes Relative: 17 %
Lymphs Abs: 1.5 10*3/uL (ref 0.7–4.0)
MCH: 29.5 pg (ref 26.0–34.0)
MCHC: 30.6 g/dL (ref 30.0–36.0)
MCV: 96.3 fL (ref 80.0–100.0)
Monocytes Absolute: 0.6 10*3/uL (ref 0.1–1.0)
Monocytes Relative: 7 %
Neutro Abs: 6.5 10*3/uL (ref 1.7–7.7)
Neutrophils Relative %: 73 %
Platelets: 216 10*3/uL (ref 150–400)
RBC: 3.22 MIL/uL — ABNORMAL LOW (ref 3.87–5.11)
RDW: 20.6 % — ABNORMAL HIGH (ref 11.5–15.5)
WBC: 8.9 10*3/uL (ref 4.0–10.5)
nRBC: 0 % (ref 0.0–0.2)

## 2018-10-27 LAB — C-REACTIVE PROTEIN: CRP: 1 mg/dL — ABNORMAL HIGH (ref <1.0)

## 2018-10-27 LAB — D-DIMER, QUANTITATIVE: D-Dimer, Quant: 0.89 ug/mL-FEU — ABNORMAL HIGH (ref 0.00–0.50)

## 2018-10-27 LAB — FERRITIN: Ferritin: 635 ng/mL — ABNORMAL HIGH (ref 11–307)

## 2018-10-27 LAB — LACTATE DEHYDROGENASE: LDH: 196 U/L — ABNORMAL HIGH (ref 98–192)

## 2018-10-27 MED ORDER — ATORVASTATIN CALCIUM 40 MG PO TABS
40.0000 mg | ORAL_TABLET | Freq: Every day | ORAL | Status: DC
  Administered 2018-10-27 – 2018-10-29 (×3): 40 mg via ORAL
  Filled 2018-10-27 (×3): qty 1

## 2018-10-27 MED ORDER — GUAIFENESIN-DM 100-10 MG/5ML PO SYRP
10.0000 mL | ORAL_SOLUTION | ORAL | Status: DC | PRN
  Administered 2018-10-27 – 2018-10-30 (×6): 10 mL via ORAL
  Filled 2018-10-27 (×6): qty 10

## 2018-10-27 NOTE — Progress Notes (Signed)
Occupational Therapy Treatment Patient Details Name: Stephanie Combs MRN: [id number] DOB: 1951-01-07 Today's Date: 10/27/2018    History of present illness On 10/11/18, Stephanie Combs is a 68 y.o. female with medical history significant of diastolic heart failure, anxiety, COPD, diabetes mellitus, chronic respiratory failure on 2 L via nasal cannula, lung and colon cancer received chemo and radiation therapy.  Oncology referred pt for admission due to a 10 pound weight gain over the last week.  She is try to manage this with oral Lasix.  Reports some associated shortness of breath as well.  She has associated lower extremity swelling with weeping which is chronic. On admission she tested + COVID-19. per MD primarily acute on chronic CHF with COVID + an incidental finding. Transfer from [place] to [place]. Developed fever, +left buttock abscess, spontaneously opened and then I&D by surgery.    OT comments  Pt progressing towards OT goals this session. Total A for LB dressing to don socks, min guard with bariatric rolling walker for toilet transfer (ambulating prior in room on 2L O2 with VSS), max A for peri care in standing due to bandage concerns. OT will continue to follow acutely.    Follow Up Recommendations  Home health OT;Supervision/Assistance - 24 hour    Equipment Recommendations  None recommended by OT    Recommendations for Other Services      Precautions / Restrictions Precautions Precautions: Fall Restrictions Weight Bearing Restrictions: No       Mobility Bed Mobility Overal bed mobility: Modified Independent       Supine to sit: Modified independent (Device/Increase time);HOB elevated(HOB15 degrees; +rail) Sit to supine: Modified independent (Device/Increase time)   General bed mobility comments: pt able to lift her legs onto the bed without assist for the first time "in awhile!" per pt  Transfers Overall transfer level: Needs assistance Equipment used: Rolling walker (2  wheeled);None Transfers: Sit to/from [employer] to Stand: Min guard(to RW as pt nearly tipped RW when coming to stand) Stand pivot transfers: Supervision       General transfer comment: for safety due to lines/monitor and pt's size;     Balance Overall balance assessment: Needs assistance Sitting-balance support: Feet supported;No upper extremity supported Sitting balance-Leahy Scale: Good     Standing balance support: During functional activity;Single extremity supported Standing balance-Leahy Scale: Poor Standing balance comment: requires UE support                           ADL either performed or assessed with clinical judgement   ADL Overall ADL's : Needs assistance/impaired     Grooming: Wash/dry face;Wash/dry hands;Set up;Sitting Grooming Details (indicate cue type and reason): on [facility]                 Toilet Transfer: Min guard;Ambulation;RW;BSC;Requires wide/bariatric   Toileting- Clothing Manipulation and Hygiene: Maximal assistance;Sit to/from stand Toileting - Clothing Manipulation Details (indicate cue type and reason): "they don't want me wiping because of my bandage"     Functional mobility during ADLs: Min guard;Rolling walker       Vision       Perception     Praxis      Cognition Arousal/Alertness: Awake/alert Behavior During Therapy: WFL for tasks assessed/performed Overall Cognitive Status: Within Functional Limits for tasks assessed  Exercises     Shoulder Instructions       General Comments      Pertinent Vitals/ Pain       Pain Assessment: Faces Faces Pain Scale: Hurts little more Pain Location: absess on bottom Pain Descriptors / Indicators: Discomfort Pain Intervention(s): Limited activity within patient's tolerance;Monitored during session;Repositioned  Home Living                                          Prior  Functioning/Environment              Frequency  Min 3X/week        Progress Toward Goals  OT Goals(current goals can now be found in the care plan section)  Progress towards OT goals: Progressing toward goals  Acute Rehab OT Goals Patient Stated Goal: return home with family and home aides to assist OT Goal Formulation: With patient Time For Goal Achievement: 10/28/18 Potential to Achieve Goals: Good  Plan Discharge plan remains appropriate    Co-evaluation    PT/OT/SLP Co-Evaluation/Treatment: Yes Reason for Co-Treatment: For patient/therapist safety PT goals addressed during session: Mobility/safety with mobility;Balance;Proper use of DME OT goals addressed during session: ADL's and self-care;Proper use of Adaptive equipment and DME      AM-PAC OT "6 Clicks" Daily Activity     Outcome Measure   Help from another person eating meals?: None Help from another person taking care of personal grooming?: A Little Help from another person toileting, which includes using toliet, bedpan, or urinal?: A Little Help from another person bathing (including washing, rinsing, drying)?: A Little Help from another person to put on and taking off regular upper body clothing?: A Little Help from another person to put on and taking off regular lower body clothing?: A Lot 6 Click Score: 18    End of Session Equipment Utilized During Treatment: Oxygen;Rolling walker(2L)  OT Visit Diagnosis: Unsteadiness on feet (R26.81);Other abnormalities of gait and mobility (R26.89);Pain;Muscle weakness (generalized) (M62.81) Pain - Right/Left: Left Pain - part of body: (buttocks)   Activity Tolerance Patient tolerated treatment well   Patient Left in bed;with call bell/phone within reach;Other (comment)(4 rails up at request of patient)   Nurse Communication Mobility status        Time: 0388-8280 OT Time Calculation (min): 28 min  Charges: OT General Charges $OT Visit: 1 Visit OT  Treatments $Self Care/Home Management : 8-22 mins  Hulda Humphrey OTR/L Acute Rehabilitation Services Pager: [phone] Office: [place] 10/27/2018, 5:33 PM

## 2018-10-27 NOTE — Progress Notes (Signed)
Physical Therapy Treatment Patient Details Name: Stephanie Combs MRN: [id number] DOB: 1951/02/11 Today's Date: 10/27/2018    History of Present Illness On 10/11/18, Stephanie Combs is a 68 y.o. female with medical history significant of diastolic heart failure, anxiety, COPD, diabetes mellitus, chronic respiratory failure on 2 L via nasal cannula, lung and colon cancer received chemo and radiation therapy.  Oncology referred pt for admission due to a 10 pound weight gain over the last week.  She is try to manage this with oral Lasix.  Reports some associated shortness of breath as well.  She has associated lower extremity swelling with weeping which is chronic. On admission she tested + COVID-19. per MD primarily acute on chronic CHF with COVID + an incidental finding. Transfer from [place] to [place]. Developed fever, +left buttock abscess, spontaneously opened and then I&D by surgery.     PT Comments    Patient appears to be feeling better--more talkative, agreeable to walk. She did very well walking with wide RW x 50 ft (stayed in room per her request) on 2L O2 and maintained SaO2 >=92% throughout. Patient insists she will be able to walk up/down her ramp into her home (she thinks it is ~20 ft). Good progress towards goals, if continues consistent performance will need goal update vs d/c.    Follow Up Recommendations  Home health PT     Equipment Recommendations  None recommended by PT    Recommendations for Other Services       Precautions / Restrictions Precautions Precautions: Fall    Mobility  Bed Mobility Overal bed mobility: Modified Independent       Supine to sit: Modified independent (Device/Increase time);HOB elevated(HOB15 degrees; +rail) Sit to supine: Modified independent (Device/Increase time)   General bed mobility comments: pt able to lift her legs onto the bed without assist for the first time "in awhile!" per pt  Transfers Overall transfer level: Needs  assistance Equipment used: Rolling walker (2 wheeled);None Transfers: Sit to/from [employer] Sit to Stand: Min guard(to RW as pt nearly tipped RW when coming to stand) Stand pivot transfers: Supervision       General transfer comment: for safety due to lines/monitor and pt's size;   Ambulation/Gait Ambulation/Gait assistance: Min guard;Supervision Gait Distance (Feet): 50 Feet Assistive device: Rolling walker (2 wheeled) Gait Pattern/deviations: Step-to pattern;Trunk flexed;Wide base of support     General Gait Details: pt talking the entire walk with only mild shortness of breath; SaO2>92% on 2L   Stairs             Wheelchair Mobility    Modified Rankin (Stroke Patients Only)       Balance           Standing balance support: During functional activity;Single extremity supported Standing balance-Leahy Scale: Poor Standing balance comment: requires UE support                            Cognition Arousal/Alertness: Awake/alert Behavior During Therapy: WFL for tasks assessed/performed Overall Cognitive Status: Within Functional Limits for tasks assessed                                        Exercises      General Comments        Pertinent Vitals/Pain      Home Living  Prior Function            PT Goals (current goals can now be found in the care plan section) Acute Rehab PT Goals Patient Stated Goal: return home with family and home aides to assist Time For Goal Achievement: 11/05/18 Progress towards PT goals: Progressing toward goals(met #1, #2 today--need to see consistency)    Frequency    Min 3X/week      PT Plan Current plan remains appropriate    Co-evaluation PT/OT/SLP Co-Evaluation/Treatment: Yes Reason for Co-Treatment: For patient/therapist safety PT goals addressed during session: Mobility/safety with mobility;Proper use of DME        AM-PAC  PT "6 Clicks" Mobility   Outcome Measure  Help needed turning from your back to your side while in a flat bed without using bedrails?: None Help needed moving from lying on your back to sitting on the side of a flat bed without using bedrails?: None Help needed moving to and from a bed to a chair (including a wheelchair)?: None Help needed standing up from a chair using your arms (e.g., wheelchair or bedside chair)?: None Help needed to walk in hospital room?: A Little Help needed climbing 3-5 steps with a railing? : A Lot 6 Click Score: 21    End of Session Equipment Utilized During Treatment: Oxygen Activity Tolerance: Patient tolerated treatment well Patient left: in bed;with call bell/phone within reach   PT Visit Diagnosis: Other abnormalities of gait and mobility (R26.89);Muscle weakness (generalized) (M62.81)     Time: 1614-4324 PT Time Calculation (min) (ACUTE ONLY): 27 min  Charges:  $Gait Training: 8-22 mins                        [employer], PT 10/27/2018, 4:55 PM

## 2018-10-27 NOTE — Progress Notes (Signed)
 Nutrition Follow-up  DOCUMENTATION CODES:   Morbid obesity  INTERVENTION:   Diet liberalized to Regular per discussion with Attending MD  D/C Ensure Enlive as pt not taking  D/C Pro-Stat as pt not taking  All food preferences placed in [facility] Touch system to ensure that pt receives foods that are palatable to her and that she will eat to promote po intake  [place] yogurt on meal trays   NUTRITION DIAGNOSIS:   Inadequate oral intake related to acute illness, nausea, poor appetite as evidenced by per patient/family report, meal completion < 25%.  Being addressed via diet liberalization, food preferences  GOAL:   Patient will meet greater than or equal to 90% of their needs  progressing  MONITOR:   PO intake, Supplement acceptance, Labs, Weight trends, Skin, I & O's  REASON FOR ASSESSMENT:   LOS    ASSESSMENT:   68 yo female admitted with acute on chronic CHF, COVID-19 gastroenteritis, buttock abscess s/p I&D. PMH includes COPD, morbid obesity, DM, CKD, CAD, CHF, small cell lung cancer  Follow-up from Initial Assessment yesterday (6/16)  Pt is a very picky eater, due to her nausea and abdominal pain pt has very few foods she will eat. Pt wants Honey Nut Cheerios with Coffee for breakfast; Plain Baked Chicken with butter/pepper, Dinner Roll, [employer], Chicken Noodle Soup with Crackers, Ice Cream and Diet Gingerale at [employer]. Pt is agreeable to Strawberry Yogurt on trays as RD reinforced the importance of adequate protein. Pt has declined all supplements including Ensure, Pro-Stat, Magic Cup, Hormel Shake.   Discussed recommendation of liberalizing diet to promote po intake with Attending MD  Labs: CBGs 146-209 Meds: megace, ss novolog, levemir, lasix, Vit C, Zinc, Zofran   Diet Order:   Diet Order            Diet regular Room service appropriate? Yes; Fluid consistency: Thin  Diet effective now              EDUCATION NEEDS:   Not appropriate  for education at this time  Skin:  Skin Assessment: Skin Integrity Issues: Skin Integrity Issues:: Other (Comment) Other: buttock abcess s/p I&D, open wound in abdominal fold  Last BM:  6/16  Height:   Ht Readings from Last 1 Encounters:  10/11/18 5' 1.5" (1.562 m)    Weight:   Wt Readings from Last 1 Encounters:  10/27/18 132.5 kg    Ideal Body Weight:  49 kg  BMI:  Body mass index is 54.3 kg/m.  Estimated Nutritional Needs:   Kcal:  2000-2200 kcals  Protein:  120-140 g  Fluid:  >/= 1.5 L    Kaedyn Belardo MS, RDN, LDN, CNSC [phone] Pager  [phone] Weekend/On-Call Pager

## 2018-10-27 NOTE — Progress Notes (Signed)
PROGRESS NOTE    Stephanie Combs  DVV:[id number] DOB: [birth year] DOA: 10/11/2018 PCP: [name], MD      Brief Narrative:  Stephanie Combs is a 68 y.o. F with dCHF and COPD on 2L home O2, both colon and small cell lung CA on carboplat/etoposide/radiation with Dr. Burr Medico, CAD, HTN, DM, hypothyroidism, obesity, IDA, RA not on DMARD and depression who presented with SOB.  Directly admitted from [place] [facility], positive CoV, CXR negative, on baseline O2.  Diagnosed with acute CHF, started diuresis.      Assessment & Plan:  Acute on chronic diastolic CHF Diuresed for 1 week, Lasix held 6/9 due to creatinine bump.    Net negative 2L yesterday, 12L on admission.  Weight 331 on admission --> 292 today.  Cr and K stable. Still swollen today. -Furosemide 40 mg IV twice a day  -Strict I/Os, daily weights, telemetry  -Daily monitoring renal function and K      Coronavirus gastroenteritis without acute hypoxic respiratory failure In setting of ongoing 2020 COVID-19 pandemic.  No clinical evidence of pneumonia, respiratory status stable, on 2L O2 by  which is her baseline. -VTE PPx with Lovenox -Continue Zinc and Vitamin C -Daily d-dimer, ferritin and CRP  COVID-19 Labs Recent Labs    10/25/18 0548 10/27/18 0523  DDIMER 0.86* 0.89*  FERRITIN 726* 635*  LDH 204* 196*  CRP 3.0* 1.0*    Buttock abscess Developed while in the hospital.  Spontaneous drainage on 6/13.  General surgery consulted, Dr. Brantley Stage evaluated the patient, and performed further bedside incision and drainage. -Continue clindamycin, day 5  Small cell lung cancer Colon cancer -Continue Megace  Pancytopenia WBC normalized after Granix. Transfused 4 units total during hospitalization (6/5, 6/12 and 6/15).  Hgb 9.5 today, stable.  No clinical bleeding.  CKD stage III Br stable at baseline 1.1 today.  Right leg pain This is new.  DVT US negative.  Likely musculoskeletal.  Diabetes with polyneuropathy  -Continue gabapentin, topiramate -Continue sliding scale correction insulin - Continue Levemir  Hypertension Coronary artery disease secondary prevention BP normal -Continue aspirin -Resume atorvastatin  COPD with chronic hypoxic respiratory failure -Continue Anoro  Rheumatoid arthritis Not on DMARD at baseline -Continue PRN Percocet  Depression  Hypothyroidism -Continue levothyroxine  Morbid obesity BMI 54 kg/m2  Other medications -Continue pantoprazole -Hold home allopurinol        MDM and disposition: The below labs and imaging reports were reviewed and summarized above.  Medication management as above.  The patient was admitted with a severe exacerbation of her chronic illness (CHF).          DVT prophylaxis: Lovenox Code Status: FULL Family Communication: Daughter by phone    Consultants:   None  Procedures:   None  Antimicrobials:   Clindamycin 6/13 >>    Culture data:   6/11 blood culture x2 -- NG      Subjective: Buttock is sore, but improving.  No confusion, fever.  No vomiting.  No dyspnea.  Cough is productive.  Legs still tight.  Still orthopneic.  Objective: Vitals:   10/27/18 0134 10/27/18 0451 10/27/18 0455 10/27/18 0507  BP: (!) 163/69  (!) 161/83   Pulse:    90  Resp:   18   Temp: 98 F (36.7 C)  97.9 F (36.6 C)   TempSrc: Oral  Oral   SpO2:   95%   Weight:  132.5 kg    Height:  Intake/Output Summary (Last 24 hours) at 10/27/2018 0727 Last data filed at 10/27/2018 0456 Gross per 24 hour  Intake 500 ml  Output 2527 ml  Net -2027 ml   Filed Weights   10/25/18 0127 10/26/18 0218 10/27/18 0451  Weight: 134.3 kg 134.3 kg 132.5 kg    Examination: General appearance: obese adult female, alert and in no acute distress.   HEENT: Anicteric, conjunctiva pink, lids and lashes normal. No nasal deformity, discharge, epistaxis.  Lips moist.   Skin: Warm and dry.  no jaundice.  Buttock abscess could not be  visualized due to patient immobility Cardiac: RRR, nl S1-S2, no murmurs appreciated.  Capillary refill is brisk.  JVP not visible.  Moderate LE edema.  Radial pulses 2+ and symmetric. Respiratory: Normal respiratory rate and rhythm.  Diminihsed bilaterally, no wheezing, tachypneic with exertion. Abdomen: Abdomen soft.  no TTP. No ascites, distension, hepatosplenomegaly.   MSK: No deformities or effusions. Neuro: Awake and alert.  EOMI, moves all extremities. Speech fluent.    Psych: Sensorium intact and responding to questions, attention normal. Affect normal.  Judgment and insight appear normal.         Data Reviewed: I have personally reviewed following labs and imaging studies:  CBC: Recent Labs  Lab 10/23/18 0500 10/24/18 0325 10/25/18 0548 10/26/18 0356 10/27/18 0523  WBC 2.7* 2.4* 9.3 11.8* 8.9  NEUTROABS 1.4* 1.0* 7.3 8.7* 6.5  HGB 7.5* 7.2* 6.9* 8.8* 9.5*  HCT 24.5* 23.2* 22.7* 28.2* 31.0*  MCV 93.9 94.3 95.0 94.6 96.3  PLT 99* 102* 117* 155 701   Basic Metabolic Panel: Recent Labs  Lab 10/23/18 0500 10/24/18 0325 10/25/18 0548 10/26/18 0356 10/27/18 0523  NA 139 138 140 140 139  K 4.3 3.9 3.4* 4.7 4.2  CL 98 100 100 100 99  CO2 30 31 30 29 29   GLUCOSE 142* 223* 171* 150* 158*  BUN 27* 27* 21 20 14   CREATININE 1.05* 1.17* 0.99 1.01* 1.13*  CALCIUM 8.1* 8.1* 8.7* 8.6* 8.7*   GFR: Estimated Creatinine Clearance: 62 mL/min (A) (by C-G formula based on SCr of 1.13 mg/dL (H)). Liver Function Tests: Recent Labs  Lab 10/21/18 0300 10/22/18 0400 10/24/18 0325 10/25/18 0548 10/27/18 0523  AST 19 23 26 31 29   ALT 10 12 18 27  35  ALKPHOS 62 65 69 71 91  BILITOT 0.4 <0.1* 0.4 0.5 0.5  PROT 5.6* 5.4* 5.7* 5.5* 6.2*  ALBUMIN 2.7* 2.5* 2.5* 2.6* 3.0*   No results for input(s): LIPASE, AMYLASE in the last 168 hours. No results for input(s): AMMONIA in the last 168 hours. Coagulation Profile: No results for input(s): INR, PROTIME in the last 168 hours.  Cardiac Enzymes: No results for input(s): CKTOTAL, CKMB, CKMBINDEX, TROPONINI in the last 168 hours. BNP (last 3 results) No results for input(s): PROBNP in the last 8760 hours. HbA1C: No results for input(s): HGBA1C in the last 72 hours. CBG: Recent Labs  Lab 10/25/18 2034 10/26/18 0823 10/26/18 1205 10/26/18 1644 10/26/18 2133  GLUCAP 179* 152* 189* 209* 166*   Lipid Profile: No results for input(s): CHOL, HDL, LDLCALC, TRIG, CHOLHDL, LDLDIRECT in the last 72 hours. Thyroid Function Tests: No results for input(s): TSH, T4TOTAL, FREET4, T3FREE, THYROIDAB in the last 72 hours. Anemia Panel: Recent Labs    10/25/18 0548 10/27/18 0523  FERRITIN 726* 635*   Urine analysis:    Component Value Date/Time   COLORURINE YELLOW 04/05/2018 1559   APPEARANCEUR HAZY (A) 04/05/2018 1559   LABSPEC >1.030 (H)  04/05/2018 1559   PHURINE 5.5 04/05/2018 1559   GLUCOSEU NEGATIVE 04/05/2018 1559   HGBUR SMALL (A) 04/05/2018 1559   BILIRUBINUR SMALL (A) 04/05/2018 1559   KETONESUR 15 (A) 04/05/2018 1559   PROTEINUR 100 (A) 04/05/2018 1559   UROBILINOGEN 0.2 12/04/2014 0650   NITRITE NEGATIVE 04/05/2018 1559   LEUKOCYTESUR SMALL (A) 04/05/2018 1559   Sepsis Labs: @LABRCNTIP (procalcitonin:4,lacticacidven:4)  ) Recent Results (from the past 240 hour(s))  Culture, blood (Routine X 2) w Reflex to ID Panel     Status: None   Collection Time: 10/21/18 12:20 PM   Specimen: BLOOD  Result Value Ref Range Status   Specimen Description   Final    BLOOD RIGHT HAND Performed at [facility], [place] [address], [place], [place] [address]    Special Requests   Final    BOTTLES DRAWN AEROBIC ONLY Blood Culture adequate volume Performed at [facility] [address] [place], [place] [address]    Culture   Final    NO GROWTH 5 DAYS Performed at [facility], [place] [address] [place], [place] [address]    Report Status 10/26/2018 FINAL  Final   Culture, blood (Routine X 2) w Reflex to ID Panel     Status: None   Collection Time: 10/21/18 12:25 PM   Specimen: BLOOD  Result Value Ref Range Status   Specimen Description   Final    BLOOD LEFT HAND Performed at [facility] [address], [place], [place] [address]    Special Requests   Final    BOTTLES DRAWN AEROBIC ONLY Blood Culture adequate volume Performed at [facility] [address], [place], [place] [address]    Culture   Final    NO GROWTH 5 DAYS Performed at [facility], [place] [address], [place],  [address]    Report Status 10/26/2018 FINAL  Final         Radiology Studies: Dg Chest Port 1 View  Result Date: 10/25/2018 CLINICAL DATA:  Shortness of breath EXAM: PORTABLE CHEST 1 VIEW COMPARISON:  10/11/2018 FINDINGS: Small right pleural effusion. Bilateral interstitial thickening. No pneumothorax. Stable cardiomegaly. No acute osseous abnormality. IMPRESSION: Small right pleural effusion bilateral interstitial thickening. Differential considerations include mild pulmonary edema versus atypical infection. Electronically Signed   By: Kathreen Devoid   On: 10/25/2018 10:59   Vas [place] Lower Extremity Venous (dvt)  Result Date: 10/26/2018  Lower Venous Study Indications: Swelling, and Pain. Other Indications: Positive Covid patient with lung and colon cancer. Anticoagulation: Lovenox. Limitations: Body habitus. Comparison Study: No prior study at this facility. Last LEV done at [facility]                   on 09/08/18, negative for DVT. Performing Technologist: Oda Cogan RDMS, RVT  Examination Guidelines: A complete evaluation includes B-mode imaging, spectral Doppler, color Doppler, and power Doppler as needed of all accessible portions of each vessel. Bilateral testing is considered an integral part of a complete examination. Limited examinations for reoccurring indications may be performed as noted.   +---------+---------------+---------+-----------+----------+--------------+ RIGHT    CompressibilityPhasicitySpontaneityPropertiesSummary        +---------+---------------+---------+-----------+----------+--------------+ CFV      Full           Yes      Yes                                 +---------+---------------+---------+-----------+----------+--------------+  SFJ      Full                                                        +---------+---------------+---------+-----------+----------+--------------+ FV Prox  Full                                                        +---------+---------------+---------+-----------+----------+--------------+ FV Mid   Full                                                        +---------+---------------+---------+-----------+----------+--------------+ FV DistalFull                                                        +---------+---------------+---------+-----------+----------+--------------+ PFV      Full                                                        +---------+---------------+---------+-----------+----------+--------------+ POP      Full           Yes      Yes                                 +---------+---------------+---------+-----------+----------+--------------+ PTV                                                   Not visualized +---------+---------------+---------+-----------+----------+--------------+ PERO                                                  Not visualized +---------+---------------+---------+-----------+----------+--------------+   +---------+---------------+---------+-----------+----------+--------------+ LEFT     CompressibilityPhasicitySpontaneityPropertiesSummary        +---------+---------------+---------+-----------+----------+--------------+ CFV      Full           Yes      Yes                                  +---------+---------------+---------+-----------+----------+--------------+ SFJ      Full                                                        +---------+---------------+---------+-----------+----------+--------------+  FV Prox  Full                                                        +---------+---------------+---------+-----------+----------+--------------+ FV Mid   Full                                                        +---------+---------------+---------+-----------+----------+--------------+ FV DistalFull                                                        +---------+---------------+---------+-----------+----------+--------------+ PFV      Full                                                        +---------+---------------+---------+-----------+----------+--------------+ POP      Full           Yes      Yes                                 +---------+---------------+---------+-----------+----------+--------------+ PTV                                                   Not visualized +---------+---------------+---------+-----------+----------+--------------+ PERO                                                  Not visualized +---------+---------------+---------+-----------+----------+--------------+    Summary: Right: There is no evidence of deep vein thrombosis in the lower extremity. However, portions of this examination were limited- see technologist comments above. Poorly visualized calf veins bilaterally cannot exclude DVT at this level. Left: There is no evidence of deep vein thrombosis in the lower extremity. However, portions of this examination were limited- see technologist comments above.  *See table(s) above for measurements and observations. Electronically signed by Deitra Mayo MD on 10/26/2018 at 3:43:42 PM.    Final         Scheduled Meds: . aspirin EC  81 mg Oral Daily  . cholecalciferol  5,000 Units Oral Daily   . docusate sodium  300 mg Oral Daily  . enoxaparin (LOVENOX) injection  70 mg Subcutaneous Q24H  . feeding supplement (ENSURE ENLIVE)  237 mL Oral BID BM  . feeding supplement (PRO-STAT SUGAR FREE 64)  30 mL Oral BID WC  . furosemide  40 mg Intravenous BID  . gabapentin  600 mg Oral TID  . Gerhardt's butt cream   Topical TID  . insulin aspart  0-20 Units Subcutaneous TID WC  .  insulin aspart  0-5 Units Subcutaneous QHS  . insulin detemir  10 Units Subcutaneous QHS  . levothyroxine  200 mcg Oral Daily  . megestrol  40 mg Oral Daily  . nystatin   Topical TID  . pantoprazole  40 mg Oral BID  . polyethylene glycol  17 g Oral BID  . senna  2 tablet Oral BID WC  . topiramate  75 mg Oral QHS  . umeclidinium-vilanterol  1 puff Inhalation Daily  . vitamin C  500 mg Oral BID  . zinc sulfate  220 mg Oral Daily   Continuous Infusions: . sodium chloride 250 mL (10/25/18 0508)  . clindamycin (CLEOCIN) IV Stopped (10/27/18 0211)     LOS: 15 days    Time spent: 35 minutes     Edwin Dada, MD [facility] 10/27/2018, 7:27 AM     Please page through [place]:  [email or web address] Password [email or web address] If 7PM-7AM, please contact night-coverage

## 2018-10-28 ENCOUNTER — Ambulatory Visit: Payer: Medicare Other

## 2018-10-28 DIAGNOSIS — Z794 Long term (current) use of insulin: Secondary | ICD-10-CM

## 2018-10-28 DIAGNOSIS — N182 Chronic kidney disease, stage 2 (mild): Secondary | ICD-10-CM

## 2018-10-28 DIAGNOSIS — E1122 Type 2 diabetes mellitus with diabetic chronic kidney disease: Secondary | ICD-10-CM

## 2018-10-28 LAB — COMPREHENSIVE METABOLIC PANEL
ALT: 28 U/L (ref 0–44)
AST: 19 U/L (ref 15–41)
Albumin: 2.8 g/dL — ABNORMAL LOW (ref 3.5–5.0)
Alkaline Phosphatase: 93 U/L (ref 38–126)
Anion gap: 9 (ref 5–15)
BUN: 17 mg/dL (ref 8–23)
CO2: 29 mmol/L (ref 22–32)
Calcium: 8.6 mg/dL — ABNORMAL LOW (ref 8.9–10.3)
Chloride: 101 mmol/L (ref 98–111)
Creatinine, Ser: 1.04 mg/dL — ABNORMAL HIGH (ref 0.44–1.00)
GFR calc Af Amer: >60 mL/min (ref >60)
GFR calc non Af Amer: 55 mL/min — ABNORMAL LOW (ref >60)
Glucose, Bld: 195 mg/dL — ABNORMAL HIGH (ref 70–99)
Potassium: 4.2 mmol/L (ref 3.5–5.1)
Sodium: 139 mmol/L (ref 135–145)
Total Bilirubin: 0.4 mg/dL (ref 0.3–1.2)
Total Protein: 5.7 g/dL — ABNORMAL LOW (ref 6.5–8.1)

## 2018-10-28 LAB — CBC
HCT: 29.9 % — ABNORMAL LOW (ref 36.0–46.0)
Hemoglobin: 9.1 g/dL — ABNORMAL LOW (ref 12.0–15.0)
MCH: 29.4 pg (ref 26.0–34.0)
MCHC: 30.4 g/dL (ref 30.0–36.0)
MCV: 96.8 fL (ref 80.0–100.0)
Platelets: 233 10*3/uL (ref 150–400)
RBC: 3.09 MIL/uL — ABNORMAL LOW (ref 3.87–5.11)
RDW: 20.8 % — ABNORMAL HIGH (ref 11.5–15.5)
WBC: 6.1 10*3/uL (ref 4.0–10.5)
nRBC: 0 % (ref 0.0–0.2)

## 2018-10-28 LAB — GLUCOSE, CAPILLARY
Glucose-Capillary: 188 mg/dL — ABNORMAL HIGH (ref 70–99)
Glucose-Capillary: 219 mg/dL — ABNORMAL HIGH (ref 70–99)
Glucose-Capillary: 334 mg/dL — ABNORMAL HIGH (ref 70–99)
Glucose-Capillary: 257 mg/dL — ABNORMAL HIGH (ref 70–99)

## 2018-10-28 MED ORDER — FUROSEMIDE 10 MG/ML IJ SOLN
40.0000 mg | Freq: Two times a day (BID) | INTRAMUSCULAR | Status: AC
  Administered 2018-10-28: 09:00:00 40 mg via INTRAVENOUS
  Filled 2018-10-28: qty 4

## 2018-10-28 MED ORDER — FUROSEMIDE 20 MG PO TABS
40.0000 mg | ORAL_TABLET | Freq: Every day | ORAL | Status: DC
  Administered 2018-10-29 – 2018-10-30 (×2): 40 mg via ORAL
  Filled 2018-10-28 (×2): qty 2

## 2018-10-28 MED ORDER — FUROSEMIDE 20 MG PO TABS
20.0000 mg | ORAL_TABLET | Freq: Every day | ORAL | Status: DC
  Administered 2018-10-29: 20 mg via ORAL
  Filled 2018-10-28: qty 1

## 2018-10-28 MED ORDER — FUROSEMIDE 20 MG PO TABS: 20.0000 mg | ORAL_TABLET | Freq: Two times a day (BID) | ORAL | Status: DC

## 2018-10-28 NOTE — Progress Notes (Signed)
PT Cancellation Note  Patient Details Name: Stephanie Combs MRN: [id number] DOB: 11-17-1950   Cancelled Treatment:    Reason Eval/Treat Not Completed: Medical issues which prohibited therapy   Patient reported she has been vomiting "all afternoon" and does not want to try to do PT   [employer], PT 10/28/2018, 4:32 PM

## 2018-10-28 NOTE — Progress Notes (Signed)
PROGRESS NOTE    Stephanie Combs  FFM:[id number] DOB: [birth year] DOA: 10/11/2018 PCP: [name], MD      Brief Narrative:  Stephanie Combs is a 68 y.o. F with dCHF and COPD on 2L home O2, both colon and small cell lung CA on carboplat/etoposide/radiation with Dr. Burr Medico, CAD, HTN, DM, hypothyroidism, obesity, IDA, RA not on DMARD and depression who presented with SOB.  Directly admitted from [place] clinic, positive CoV, CXR negative, on baseline O2.  Diagnosed with acute CHF, started diuresis.      Assessment & Plan:  Acute on chronic diastolic CHF Admitted and diuresed for 1 week, Lasix held 6/9 due to creatinine bump.  Still appeared fluid overloaded and so diuresis resumed.  Net negative 0.8L yesterday, 10L on admission.  Weight 331 on admission --> 290 today.  Cr and K stable/normal.  Still appears fluid overloaded. -Continue Furosemide 40 mg IV twice a day  -Strict I/Os, daily weights, telemetry  -Daily monitoring renal function and K      Coronavirus gastroenteritis without acute hypoxic respiratory failure In setting of ongoing 2020 COVID-19 pandemic.  No clinical evidence of pneumonia, respiratory status stable, on 2L O2 by [place] which is her baseline.  -Continue VTE PPx with Lovenox -Continue Zinc and Vitamin C   COVID-19 Labs Recent Labs    10/27/18 0523  DDIMER 0.89*  FERRITIN 635*  LDH 196*  CRP 1.0*    Buttock abscess Developed while in the hospital.  Spontaneous drainage on 6/13.  General surgery consulted, Dr. Brantley Stage evaluated the patient, and performed further bedside incision and drainage.    Improving, WBC normalized. -Continue clindamycin, day 6 of 7  Small cell lung cancer Colon cancer -Continue Megace  Pancytopenia WBC normalized after Granix. Transfused 4 units total during hospitalization (6/5, 6/12 and 6/15).  Hgb 9.1 today, stable.  No clinical bleeding.  CKD stage III Cr stable at baseline 1.0 today.  Right leg pain This is  new.  DVT US negative.  Likely musculoskeletal.  Diabetes with polyneuropathy Diet liberalized yesterday, hyperglycemic today -Continue gabapentin, topiramate -Continue sliding scale correction insulin -Continue Levemir  Hypertension Coronary artery disease secondary prevention BP normal -Continue aspirin -Continue atorvastatin  COPD with chronic hypoxic respiratory failure -Continue Anoro  Rheumatoid arthritis Not on DMARD at baseline -Continue PRN Percocet  Depression  Hypothyroidism -Continue levothyroxine  Morbid obesity BMI 54 kg/m2  Other medications -Continue pantoprazole -Hold home allopurinol        MDM and disposition: The below labs and imaging reports were reviewed and summarized above.  Medication management as above.  The patient was admitted with a severe exacerbation of her chronic congestive heart failure.       DVT prophylaxis: Lovenox Code Status: FULL Family Communication: Attempted call to daughter again, no answer, left VM    Consultants:   None  Procedures:   None  Antimicrobials:   Clindamycin 6/13 >>    Culture data:   6/11 blood culture x2 -- NG      Subjective: Buttock pain resolving.  No new fever, confusion, vomiting, dyspnea.  No productive cough.  Objective: Vitals:   10/27/18 1644 10/27/18 2037 10/28/18 0402 10/28/18 0441  BP: (!) 127/56 (!) 130/56 (!) 142/76   Pulse: 75 88 77   Resp: 16 (!) 22    Temp: 98.4 F (36.9 C) 99.1 F (37.3 C) 98.4 F (36.9 C)   TempSrc: Oral Oral Oral   SpO2: 97% 97%    Weight:  131.8 kg  Height:        Intake/Output Summary (Last 24 hours) at 10/28/2018 0647 Last data filed at 10/28/2018 0442 Gross per 24 hour  Intake 720 ml  Output 1500 ml  Net -780 ml   Filed Weights   10/26/18 0218 10/27/18 0451 10/28/18 0441  Weight: 134.3 kg 132.5 kg 131.8 kg    Examination: General appearance: This adult female, no acute distress, lying in bed.  Interactive.    HEENT: Anicteric, conjunctival pink, lids and lashes normal.  No nasal deformity, discharge, epistaxis.  Lips moist.   Skin: Warm and dry, no jaundice, chronic venous stasis changes in bilateral legs, no focal abscess or erythema to suggest cellulitis Cardiac: Regular rate and rhythm, no murmurs appreciated, JVP not visible, moderate bilateral lower extremity edema, minimal pitting. Respiratory: Normal respiratory rate and rhythm, lungs diminished bilaterally, no wheezing appreciated. Abdomen: Abdomen soft, no tenderness to palpation, no ascites or hepatosplenomegaly. MSK: No deformities or effusions. Neuro: Awake and alert, extraocular movements intact, moves all extremities with global/symmetric weakness.  Speech fluent.    Psych: Sensorium intact and responding to questions, attention normal, affect normal, judgment and insight appear normal.         Data Reviewed: I have personally reviewed following labs and imaging studies:  CBC: Recent Labs  Lab 10/23/18 0500 10/24/18 0325 10/25/18 0548 10/26/18 0356 10/27/18 0523  WBC 2.7* 2.4* 9.3 11.8* 8.9  NEUTROABS 1.4* 1.0* 7.3 8.7* 6.5  HGB 7.5* 7.2* 6.9* 8.8* 9.5*  HCT 24.5* 23.2* 22.7* 28.2* 31.0*  MCV 93.9 94.3 95.0 94.6 96.3  PLT 99* 102* 117* 155 993   Basic Metabolic Panel: Recent Labs  Lab 10/23/18 0500 10/24/18 0325 10/25/18 0548 10/26/18 0356 10/27/18 0523  NA 139 138 140 140 139  K 4.3 3.9 3.4* 4.7 4.2  CL 98 100 100 100 99  CO2 30 31 30 29 29   GLUCOSE 142* 223* 171* 150* 158*  BUN 27* 27* 21 20 14   CREATININE 1.05* 1.17* 0.99 1.01* 1.13*  CALCIUM 8.1* 8.1* 8.7* 8.6* 8.7*   GFR: Estimated Creatinine Clearance: 61.8 mL/min (A) (by C-G formula based on SCr of 1.13 mg/dL (H)). Liver Function Tests: Recent Labs  Lab 10/22/18 0400 10/24/18 0325 10/25/18 0548 10/27/18 0523  AST 23 26 31 29   ALT 12 18 27  35  ALKPHOS 65 69 71 91  BILITOT <0.1* 0.4 0.5 0.5  PROT 5.4* 5.7* 5.5* 6.2*  ALBUMIN 2.5* 2.5*  2.6* 3.0*   No results for input(s): LIPASE, AMYLASE in the last 168 hours. No results for input(s): AMMONIA in the last 168 hours. Coagulation Profile: No results for input(s): INR, PROTIME in the last 168 hours. Cardiac Enzymes: No results for input(s): CKTOTAL, CKMB, CKMBINDEX, TROPONINI in the last 168 hours. BNP (last 3 results) No results for input(s): PROBNP in the last 8760 hours. HbA1C: No results for input(s): HGBA1C in the last 72 hours. CBG: Recent Labs  Lab 10/26/18 2133 10/27/18 0809 10/27/18 1137 10/27/18 1642 10/27/18 2036  GLUCAP 166* 146* 188* 201* 313*   Lipid Profile: No results for input(s): CHOL, HDL, LDLCALC, TRIG, CHOLHDL, LDLDIRECT in the last 72 hours. Thyroid Function Tests: No results for input(s): TSH, T4TOTAL, FREET4, T3FREE, THYROIDAB in the last 72 hours. Anemia Panel: Recent Labs    10/27/18 0523  FERRITIN 635*   Urine analysis:    Component Value Date/Time   COLORURINE YELLOW 04/05/2018 1559   APPEARANCEUR HAZY (A) 04/05/2018 1559   LABSPEC >1.030 (H) 04/05/2018  [place] 5.5 04/05/2018 1559   GLUCOSEU NEGATIVE 04/05/2018 1559   HGBUR SMALL (A) 04/05/2018 1559   BILIRUBINUR SMALL (A) 04/05/2018 1559   KETONESUR 15 (A) 04/05/2018 1559   PROTEINUR 100 (A) 04/05/2018 1559   UROBILINOGEN 0.2 12/04/2014 0650   NITRITE NEGATIVE 04/05/2018 1559   LEUKOCYTESUR SMALL (A) 04/05/2018 1559   Sepsis Labs: @LABRCNTIP (procalcitonin:4,lacticacidven:4)  ) Recent Results (from the past 240 hour(s))  Culture, blood (Routine X 2) w Reflex to ID Panel     Status: None   Collection Time: 10/21/18 12:20 PM   Specimen: BLOOD  Result Value Ref Range Status   Specimen Description   Final    BLOOD RIGHT HAND Performed at [facility], [place] [address] [place], [place] [address]    Special Requests   Final    BOTTLES DRAWN AEROBIC ONLY Blood Culture adequate volume Performed at [facility] [address], [place], [place] [address]    Culture   Final    NO GROWTH 5 DAYS Performed at [facility], [place] [address], [place], [place] [address]    Report Status 10/26/2018 FINAL  Final  Culture, blood (Routine X 2) w Reflex to ID Panel     Status: None   Collection Time: 10/21/18 12:25 PM   Specimen: BLOOD  Result Value Ref Range Status   Specimen Description   Final    BLOOD LEFT HAND Performed at [facility] [address], [place], [place] [address]    Special Requests   Final    BOTTLES DRAWN AEROBIC ONLY Blood Culture adequate volume Performed at [facility] [address] [place], [place] [address]    Culture   Final    NO GROWTH 5 DAYS Performed at [facility], [place] [address], [place], [place] [address]    Report Status 10/26/2018 FINAL  Final         Radiology Studies: Vas [place] Lower Extremity Venous (dvt)  Result Date: 10/26/2018  Lower Venous Study Indications: Swelling, and Pain. Other Indications: Positive Covid patient with lung and colon cancer. Anticoagulation: Lovenox. Limitations: Body habitus. Comparison Study: No prior study at this facility. Last LEV done at [facility]                   on 09/08/18, negative for DVT. Performing Technologist: Oda Cogan RDMS, RVT  Examination Guidelines: A complete evaluation includes B-mode imaging, spectral Doppler, color Doppler, and power Doppler as needed of all accessible portions of each vessel. Bilateral testing is considered an integral part of a complete examination. Limited examinations for reoccurring indications may be performed as noted.  +---------+---------------+---------+-----------+----------+--------------+ RIGHT    CompressibilityPhasicitySpontaneityPropertiesSummary        +---------+---------------+---------+-----------+----------+--------------+ CFV      Full           Yes      Yes                                  +---------+---------------+---------+-----------+----------+--------------+ SFJ      Full                                                        +---------+---------------+---------+-----------+----------+--------------+ FV  Prox  Full                                                        +---------+---------------+---------+-----------+----------+--------------+ FV Mid   Full                                                        +---------+---------------+---------+-----------+----------+--------------+ FV DistalFull                                                        +---------+---------------+---------+-----------+----------+--------------+ PFV      Full                                                        +---------+---------------+---------+-----------+----------+--------------+ POP      Full           Yes      Yes                                 +---------+---------------+---------+-----------+----------+--------------+ PTV                                                   Not visualized +---------+---------------+---------+-----------+----------+--------------+ PERO                                                  Not visualized +---------+---------------+---------+-----------+----------+--------------+   +---------+---------------+---------+-----------+----------+--------------+ LEFT     CompressibilityPhasicitySpontaneityPropertiesSummary        +---------+---------------+---------+-----------+----------+--------------+ CFV      Full           Yes      Yes                                 +---------+---------------+---------+-----------+----------+--------------+ SFJ      Full                                                        +---------+---------------+---------+-----------+----------+--------------+ FV Prox  Full                                                         +---------+---------------+---------+-----------+----------+--------------+  FV Mid   Full                                                        +---------+---------------+---------+-----------+----------+--------------+ FV DistalFull                                                        +---------+---------------+---------+-----------+----------+--------------+ PFV      Full                                                        +---------+---------------+---------+-----------+----------+--------------+ POP      Full           Yes      Yes                                 +---------+---------------+---------+-----------+----------+--------------+ PTV                                                   Not visualized +---------+---------------+---------+-----------+----------+--------------+ PERO                                                  Not visualized +---------+---------------+---------+-----------+----------+--------------+    Summary: Right: There is no evidence of deep vein thrombosis in the lower extremity. However, portions of this examination were limited- see technologist comments above. Poorly visualized calf veins bilaterally cannot exclude DVT at this level. Left: There is no evidence of deep vein thrombosis in the lower extremity. However, portions of this examination were limited- see technologist comments above.  *See table(s) above for measurements and observations. Electronically signed by Deitra Mayo MD on 10/26/2018 at 3:43:42 PM.    Final         Scheduled Meds: . aspirin EC  81 mg Oral Daily  . atorvastatin  40 mg Oral q1800  . cholecalciferol  5,000 Units Oral Daily  . docusate sodium  300 mg Oral Daily  . enoxaparin (LOVENOX) injection  70 mg Subcutaneous Q24H  . furosemide  40 mg Intravenous BID  . gabapentin  600 mg Oral TID  . Gerhardt's butt cream   Topical TID  . insulin aspart  0-20 Units Subcutaneous TID WC  .  insulin aspart  0-5 Units Subcutaneous QHS  . insulin detemir  10 Units Subcutaneous QHS  . levothyroxine  200 mcg Oral Daily  . megestrol  40 mg Oral Daily  . nystatin   Topical TID  . pantoprazole  40 mg Oral BID  . polyethylene glycol  17 g Oral BID  . senna  2 tablet Oral BID WC  . topiramate  75 mg Oral QHS  . umeclidinium-vilanterol  1 puff Inhalation Daily  . vitamin C  500 mg Oral BID  . zinc sulfate  220 mg Oral Daily   Continuous Infusions: . sodium chloride 250 mL (10/25/18 0508)  . clindamycin (CLEOCIN) IV Stopped (10/28/18 0008)     LOS: 16 days    Time spent: 35 minutes     Edwin Dada, MD [facility] 10/28/2018, 6:47 AM     Please page through [place]:  [email or web address] If 7PM-7AM, please contact night-coverage

## 2018-10-29 ENCOUNTER — Telehealth: Payer: Self-pay | Admitting: Hematology

## 2018-10-29 ENCOUNTER — Ambulatory Visit: Payer: Medicare Other

## 2018-10-29 ENCOUNTER — Encounter: Payer: Self-pay | Admitting: Hematology

## 2018-10-29 LAB — COMPREHENSIVE METABOLIC PANEL WITH GFR
ALT: 24 U/L (ref 0–44)
AST: 17 U/L (ref 15–41)
Albumin: 2.9 g/dL — ABNORMAL LOW (ref 3.5–5.0)
Alkaline Phosphatase: 94 U/L (ref 38–126)
Anion gap: 12 (ref 5–15)
BUN: 20 mg/dL (ref 8–23)
CO2: 25 mmol/L (ref 22–32)
Calcium: 8.5 mg/dL — ABNORMAL LOW (ref 8.9–10.3)
Chloride: 100 mmol/L (ref 98–111)
Creatinine, Ser: 1.08 mg/dL — ABNORMAL HIGH (ref 0.44–1.00)
GFR calc Af Amer: >60 mL/min
GFR calc non Af Amer: 53 mL/min — ABNORMAL LOW
Glucose, Bld: 226 mg/dL — ABNORMAL HIGH (ref 70–99)
Potassium: 4.1 mmol/L (ref 3.5–5.1)
Sodium: 137 mmol/L (ref 135–145)
Total Bilirubin: 0.3 mg/dL (ref 0.3–1.2)
Total Protein: 5.9 g/dL — ABNORMAL LOW (ref 6.5–8.1)

## 2018-10-29 LAB — GLUCOSE, CAPILLARY
Glucose-Capillary: 220 mg/dL — ABNORMAL HIGH (ref 70–99)
Glucose-Capillary: 191 mg/dL — ABNORMAL HIGH (ref 70–99)
Glucose-Capillary: 251 mg/dL — ABNORMAL HIGH (ref 70–99)
Glucose-Capillary: 339 mg/dL — ABNORMAL HIGH (ref 70–99)

## 2018-10-29 LAB — CBC
HCT: 30.1 % — ABNORMAL LOW (ref 36.0–46.0)
Hemoglobin: 9.3 g/dL — ABNORMAL LOW (ref 12.0–15.0)
MCH: 30 pg (ref 26.0–34.0)
MCHC: 30.9 g/dL (ref 30.0–36.0)
MCV: 97.1 fL (ref 80.0–100.0)
Platelets: 282 10*3/uL (ref 150–400)
RBC: 3.1 MIL/uL — ABNORMAL LOW (ref 3.87–5.11)
RDW: 20.9 % — ABNORMAL HIGH (ref 11.5–15.5)
WBC: 5 10*3/uL (ref 4.0–10.5)
nRBC: 0 % (ref 0.0–0.2)

## 2018-10-29 LAB — FERRITIN: Ferritin: 498 ng/mL — ABNORMAL HIGH (ref 11–307)

## 2018-10-29 LAB — D-DIMER, QUANTITATIVE: D-Dimer, Quant: 0.8 ug/mL-FEU — ABNORMAL HIGH (ref 0.00–0.50)

## 2018-10-29 LAB — C-REACTIVE PROTEIN: CRP: <0.8 mg/dL

## 2018-10-29 MED ORDER — SODIUM CHLORIDE 0.9% FLUSH
10.0000 mL | INTRAVENOUS | Status: DC | PRN
  Administered 2018-10-29: 10 mL
  Filled 2018-10-29: qty 40

## 2018-10-29 MED ORDER — ENOXAPARIN SODIUM 80 MG/0.8ML ~~LOC~~ SOLN
65.0000 mg | SUBCUTANEOUS | Status: DC
  Administered 2018-10-29: 65 mg via SUBCUTANEOUS
  Filled 2018-10-29: qty 0.8

## 2018-10-29 MED ORDER — SODIUM CHLORIDE 0.9% FLUSH
10.0000 mL | Freq: Two times a day (BID) | INTRAVENOUS | Status: DC
  Administered 2018-10-29 – 2018-10-30 (×2): 10 mL

## 2018-10-29 NOTE — Telephone Encounter (Signed)
Per 6/19 schedule message moved appointments week of 6/22 to start later in the week instead of Monday 6/22. Adjusted appointments too 6/24 thru 6/27. Confirmed with patient.  Per patient she has to try to arrange a ride due to all that is going on she does not know if the person who has been bring her will continue to bring her, if not she will not have a way to get here. Message to transportation coordinator.

## 2018-10-29 NOTE — Progress Notes (Signed)
PROGRESS NOTE    Stephanie Combs  TTS:[id number] DOB: Sep 27, 1950 DOA: 10/11/2018 PCP: [name], MD      Brief Narrative:  Stephanie Combs is a 68 y.o. F with dCHF and COPD on 2L home O2, both colon and small cell lung CA on carboplat/etoposide/radiation with Dr. Burr Medico, CAD, HTN, DM, hypothyroidism, obesity, IDA, RA not on DMARD and depression who presented with SOB.  Directly admitted from [place] clinic, positive CoV, CXR negative, on baseline O2.  Diagnosed with acute CHF, started diuresis.      Assessment & Plan:  Acute on chronic diastolic CHF Admitted and diuresed for 1 week, Lasix held 6/9 due to creatinine bump.  Still appeared fluid overloaded and so diuresis resumed.  Net even yesterday, -11L on admission.  Weight 331 on admission --> 291 today, up half lb.  Cr and K normal.  -Transition to oral Lasix 40 BID -Strict I/Os, daily weights, telemetry  -Daily monitoring renal function and K      Coronavirus gastroenteritis without acute hypoxic respiratory failure In setting of ongoing 2020 COVID-19 pandemic.  No clinical evidence of pneumonia, respiratory status stable, on 2L O2 by [place] which is her baseline.  -Continue VTE PPx with Lovenox -Continue Zinc and Vitamin C   COVID-19 Labs Recent Labs    10/27/18 0523 10/29/18 0300  DDIMER 0.89* 0.80*  FERRITIN 635* 498*  LDH 196*  --   CRP 1.0* <0.8    Buttock abscess Developed while in the hospital.  Spontaneous drainage on 6/13.  General surgery consulted, Dr. Brantley Stage evaluated the patient, and performed further bedside incision and drainage.    WBC stable.  This abscess is improved. -Continue clindamycin, day 7 of 7  Small cell lung cancer Colon cancer -Continue Megace  Pancytopenia WBC normalized after Granix. Transfused 4 units total during hospitalization (6/5, 6/12 and 6/15).  Hgb 9.3 today, stable.  No clinical bleeding.  CKD stage III Cr stable at baseline today  Right leg pain This is new.   DVT US negative.  Likely musculoskeletal.  Diabetes with polyneuropathy Diet liberalized yesterday, hyperglycemic today -Continue gabapentin, topiramate -Continue sliding scale correction insulin -Continue Levemir  Hypertension Coronary artery disease secondary prevention BP normal -Continue aspirin -Continue atorvastatin  COPD with chronic hypoxic respiratory failure -Continue Anoro  Rheumatoid arthritis Not on DMARD at baseline -Continue PRN Percocet  Depression  Hypothyroidism -Continue levothyroxine  Morbid obesity BMI 54 kg/m2  Other medications -Continue pantoprazole -Hold home allopurinol        MDM and disposition: The below labs and imaging reports were reviewed and summarized above.  Medication management as above.  The patient was admitted with a severe exacerbation of her chronic congestive heart failure.  She is required 2 weeks of IV diuresis, slowly returning to her dry weight of 290 pounds.  We will resume oral diuresis today, if she remains net even, home tomorrow.       DVT prophylaxis: Lovenox Code Status: FULL Family Communication: Spoke with daughter by phone    Consultants:   None  Procedures:   None  Antimicrobials:   Clindamycin 6/13 >>    Culture data:   6/11 blood culture x2 -- NG      Subjective: No worsening of leg swelling, no orthopnea.  She has a stable cough, she had some chills yesterday, her buttock pain is improved.  Objective: Vitals:   10/28/18 1923 10/29/18 0358 10/29/18 0406 10/29/18 0457  BP: (!) 138/54  (!) 120/57  Pulse: 72     Resp: 16  19   Temp: 98.9 F (37.2 C) 98.2 F (36.8 C) 98.4 F (36.9 C)   TempSrc: Oral  Oral   SpO2: 99%     Weight:    132 kg  Height:        Intake/Output Summary (Last 24 hours) at 10/29/2018 0741 Last data filed at 10/29/2018 0509 Gross per 24 hour  Intake 1320 ml  Output 1090 ml  Net 230 ml   Filed Weights   10/27/18 0451 10/28/18 0441 10/29/18  0457  Weight: 132.5 kg 131.8 kg 132 kg    Examination: General appearance: B's adult female, lying in recliner, no acute distress HEENT: Anicteric, conjunctival pink, lids and lashes normal.  No nasal deformity, discharge, epistaxis.  Lips moist.   Skin: Venous stasis changes to bilateral legs. Cardiac: Regular rate and rhythm, no murmurs appreciated, JVP not visible, no appreciable lower extremity edema Respiratory: Normal respiratory effort, lungs diminished bilaterally, no wheezing appreciated. Abdomen: Abdomen soft without tenderness to palpation or ascites or hepatosplenomegaly. MSK: No deformities or effusions. Neuro: Awake and alert, extraocular movements intact, moves all extremities with symmetric weakness, speech fluent. Psych: Sensorium intact and responding to questions, attention normal, affect normal, judgment and insight appear normal.         Data Reviewed: I have personally reviewed following labs and imaging studies:  CBC: Recent Labs  Lab 10/23/18 0500 10/24/18 0325 10/25/18 0548 10/26/18 0356 10/27/18 0523 10/28/18 0705 10/29/18 0300  WBC 2.7* 2.4* 9.3 11.8* 8.9 6.1 5.0  NEUTROABS 1.4* 1.0* 7.3 8.7* 6.5  --   --   HGB 7.5* 7.2* 6.9* 8.8* 9.5* 9.1* 9.3*  HCT 24.5* 23.2* 22.7* 28.2* 31.0* 29.9* 30.1*  MCV 93.9 94.3 95.0 94.6 96.3 96.8 97.1  PLT 99* 102* 117* 155 216 233 940   Basic Metabolic Panel: Recent Labs  Lab 10/25/18 0548 10/26/18 0356 10/27/18 0523 10/28/18 0705 10/29/18 0300  NA 140 140 139 139 137  K 3.4* 4.7 4.2 4.2 4.1  CL 100 100 99 101 100  CO2 30 29 29 29 25   GLUCOSE 171* 150* 158* 195* 226*  BUN 21 20 14 17 20   CREATININE 0.99 1.01* 1.13* 1.04* 1.08*  CALCIUM 8.7* 8.6* 8.7* 8.6* 8.5*   GFR: Estimated Creatinine Clearance: 64.7 mL/min (A) (by C-G formula based on SCr of 1.08 mg/dL (H)). Liver Function Tests: Recent Labs  Lab 10/24/18 0325 10/25/18 0548 10/27/18 0523 10/28/18 0705 10/29/18 0300  AST 26 31 29 19 17    ALT 18 27 35 28 24  ALKPHOS 69 71 91 93 94  BILITOT 0.4 0.5 0.5 0.4 0.3  PROT 5.7* 5.5* 6.2* 5.7* 5.9*  ALBUMIN 2.5* 2.6* 3.0* 2.8* 2.9*   No results for input(s): LIPASE, AMYLASE in the last 168 hours. No results for input(s): AMMONIA in the last 168 hours. Coagulation Profile: No results for input(s): INR, PROTIME in the last 168 hours. Cardiac Enzymes: No results for input(s): CKTOTAL, CKMB, CKMBINDEX, TROPONINI in the last 168 hours. BNP (last 3 results) No results for input(s): PROBNP in the last 8760 hours. HbA1C: No results for input(s): HGBA1C in the last 72 hours. CBG: Recent Labs  Lab 10/27/18 2036 10/28/18 0724 10/28/18 1120 10/28/18 1613 10/28/18 2032  GLUCAP 313* 188* 219* 334* 257*   Lipid Profile: No results for input(s): CHOL, HDL, LDLCALC, TRIG, CHOLHDL, LDLDIRECT in the last 72 hours. Thyroid Function Tests: No results for input(s): TSH, T4TOTAL, FREET4, T3FREE, THYROIDAB in  the last 72 hours. Anemia Panel: Recent Labs    10/27/18 0523 10/29/18 0300  FERRITIN 635* 498*   Urine analysis:    Component Value Date/Time   COLORURINE YELLOW 04/05/2018 1559   APPEARANCEUR HAZY (A) 04/05/2018 1559   LABSPEC >1.030 (H) 04/05/2018 1559   PHURINE 5.5 04/05/2018 1559   GLUCOSEU NEGATIVE 04/05/2018 1559   HGBUR SMALL (A) 04/05/2018 1559   BILIRUBINUR SMALL (A) 04/05/2018 1559   KETONESUR 15 (A) 04/05/2018 1559   PROTEINUR 100 (A) 04/05/2018 1559   UROBILINOGEN 0.2 12/04/2014 0650   NITRITE NEGATIVE 04/05/2018 1559   LEUKOCYTESUR SMALL (A) 04/05/2018 1559   Sepsis Labs: @LABRCNTIP (procalcitonin:4,lacticacidven:4)  ) Recent Results (from the past 240 hour(s))  Culture, blood (Routine X 2) w Reflex to ID Panel     Status: None   Collection Time: 10/21/18 12:20 PM   Specimen: BLOOD  Result Value Ref Range Status   Specimen Description   Final    BLOOD RIGHT HAND Performed at [facility], [place] [address], [place], [place] [address]     Special Requests   Final    BOTTLES DRAWN AEROBIC ONLY Blood Culture adequate volume Performed at [facility] [address], [place], [place] [address]    Culture   Final    NO GROWTH 5 DAYS Performed at [facility], [place] [address] [place], [place] [address]    Report Status 10/26/2018 FINAL  Final  Culture, blood (Routine X 2) w Reflex to ID Panel     Status: None   Collection Time: 10/21/18 12:25 PM   Specimen: BLOOD  Result Value Ref Range Status   Specimen Description   Final    BLOOD LEFT HAND Performed at [facility] [address] [place], [place] [address]    Special Requests   Final    BOTTLES DRAWN AEROBIC ONLY Blood Culture adequate volume Performed at [facility] [address], [place], [place] [address]    Culture   Final    NO GROWTH 5 DAYS Performed at [facility], [place] [address] [place], [place] [address]    Report Status 10/26/2018 FINAL  Final         Radiology Studies: No results found.      Scheduled Meds: . aspirin EC  81 mg Oral Daily  . atorvastatin  40 mg Oral q1800  . cholecalciferol  5,000 Units Oral Daily  . docusate sodium  300 mg Oral Daily  . enoxaparin (LOVENOX) injection  70 mg Subcutaneous Q24H  . furosemide  40 mg Oral QAC breakfast   And  . furosemide  20 mg Oral Q2000  . gabapentin  600 mg Oral TID  . Gerhardt's butt cream   Topical TID  . insulin aspart  0-20 Units Subcutaneous TID WC  . insulin aspart  0-5 Units Subcutaneous QHS  . insulin detemir  10 Units Subcutaneous QHS  . levothyroxine  200 mcg Oral Daily  . megestrol  40 mg Oral Daily  . nystatin   Topical TID  . pantoprazole  40 mg Oral BID  . polyethylene glycol  17 g Oral BID  . senna  2 tablet Oral BID WC  . topiramate  75 mg Oral QHS  . umeclidinium-vilanterol  1 puff Inhalation Daily  . vitamin C  500 mg Oral BID  . zinc sulfate  220 mg Oral Daily   Continuous Infusions:  . sodium chloride 250 mL (  10/25/18 0508)  . clindamycin (CLEOCIN) IV Stopped (10/28/18 2356)     LOS: 17 days    Time spent: 25 minutes     Edwin Dada, MD [facility] 10/29/2018, 7:41 AM     Please page through [place]:  [email or web address] Password [email or web address] If 7PM-7AM, please contact night-coverage

## 2018-10-29 NOTE — Progress Notes (Signed)
PHARMACY CONSULT: Lovenox for VTE prophylaxis COVID protocol  Wt: decreased to 132 kg BMI:  54 Scr:  1.08 CrCl >30 ml/hr  Hgb: 9.3 remains low/stable (s/p 2 units PRBC on 6/15) No bleeding or complications reported Pltc increased to 282 D-dimer: 0.8 Venous duplex lower ext completed (6/16):  no DVT noted, but exam limited.  A/P:  Due to BMI > 35, continue weight-adjusted lovenox 0.5mg /kg (now rounded down to 65 mg) SQ q24h  Monitor weight, CBC and renal function, adjust as needed   Gretta Arab PharmD, BCPS Clinical Pharmacist Clinical pharmacist phone 7am- 5pm: [phone] 10/29/2018 8:08 AM

## 2018-10-29 NOTE — Progress Notes (Signed)
[place]   Telephone:([phone]) [phone] Fax:(336) [phone]   Clinic Follow up Note   Patient Care Team: [name], MD as PCP - General (Internal Medicine) Herminio Commons, MD as PCP - Cardiology (Cardiology) Neldon Labella, RN as [place] Management  Date of Service:  11/03/2018  CHIEF COMPLAINT: F/u ofright colon cancerand Small cell lung cancer  SUMMARY OF ONCOLOGIC HISTORY: Oncology History Overview Note  Cancer Staging Primary cancer of hepatic flexure of colon [facility]) Staging form: Colon and Rectum, AJCC 8th Edition - Clinical stage from 09/09/2018: Stage Unknown (cTX, cN0, cM0) - Signed by Truitt Merle, MD on 09/28/2018  Small cell lung cancer, left upper lobe ([place]) Staging form: Lung, AJCC 8th Edition - Clinical stage from 09/14/2018: Stage IIIB (cT3, cN2, cM0) - Signed by Truitt Merle, MD on 09/28/2018     Primary cancer of hepatic flexure of colon [facility])   Initial Diagnosis   Malignant neoplasm of hepatic flexure ([place])   09/09/2018 Procedure   Coloscopy by Dr Gala Romney 09/09/18  IMPRESSION -Colon mass as described in the vicinity of the hepatic flexure - status post biopsy and inking. Polyps associated with this tumor scattered all the way to the cecum were not removed; multiple splenic flexure and rectal polyps were removed as described above. Left-sided diverticulosis.  Upper endoscopy By Dr Gala Romney 09/09/18  IMPRESSION -Abnormal distal esophagus suspicious for Barrett's esophagus?biopsied. - Small hiatal hernia. - Erosive gastropathy. Biopsied. -Duodenal bulbar erosions   09/09/2018 Initial Biopsy   Diagnosis 09/09/18 1. Stomach, biopsy - GASTRIC ANTRAL MUCOSA WITH MILD NONSPECIFIC REACTIVE GASTROPATHY - GASTRIC OXYNTIC MUCOSA WITH PARIETAL CELL HYPERPLASIA AS CAN BE SEEN IN HYPERGASTRINEMIC STATES SUCH AS PROPER PATIENT IDENTIFICATION THERAPY. - WARTHIN STARRY STAIN IS NEGATIVE FOR HELICOBACTER PYLORI 2. Esophagus, biopsy -  BARRETT'S ESOPHAGUS, NEGATIVE FOR DYSPLASIA 3. Colon, polyp(s), descending - TUBULAR ADENOMA - NEGATIVE FOR HIGH-GRADE DYSPLASIA OR MALIGNANCY 4. Colon, biopsy, hepatic flexure - ADENOCARCINOMA. SEE NOTE 5. Colon, polyp(s), splenic flexure - ADENOCARCINOMA. SEE NOTE - OTHER FRAGMENTS OF TUBULAR ADENOMA(S) WITH ONE SHOWING HIGH-GRADE DYSPLASIA 6. Rectum, polyp(s) - TUBULAR ADENOMA(S) - NEGATIVE FOR HIGH-GRADE DYSPLASIA OR MALIGNANCY   09/09/2018 Cancer Staging   Staging form: Colon and Rectum, AJCC 8th Edition - Clinical stage from 09/09/2018: Stage Unknown (cTX, cN0, cM0) - Signed by Truitt Merle, MD on 09/28/2018   09/11/2018 Imaging   CT CAP 09/11/18  IMPRESSION: 1. Central perihilar left upper lobe 5.4 cm lung mass, new since 01/27/2017 chest CT, compatible with malignancy, favor primary bronchogenic carcinoma. Postobstructive pneumonia/atelectasis in the lingula. 2. Mediastinal adenopathy in the left prevascular/AP window and subcarinal stations, compatible with metastatic nodes (more likely originating from the left upper lobe lung mass). 3. Trace dependent left pleural effusion. 4. Apple-core lesion in the ascending colon, compatible with known primary colonic neoplasm. Adjacent top-normal size right mesenteric lymph node is equivocal for locoregional nodal metastasis. No additional potential findings of metastatic disease in the abdomen or pelvis. 5. Chronic findings include: Aortic Atherosclerosis (ICD10-I70.0). Borderline mild cardiomegaly. One vessel coronary atherosclerosis. Dilated main pulmonary artery, suggesting pulmonary arterial hypertension. Left adrenal adenoma. Punctate bilateral nephrolithiasis. Small hiatal hernia.   09/20/2018 Imaging   Brain MRI 09/20/18  IMPRESSION: 1. Motion degraded examination without brain metastases identified. 2. Small skull metastases not excluded. 3. Moderate to severe chronic small vessel ischemic disease.   Small cell lung cancer,  left upper lobe ([place])  09/11/2018 Imaging   CT CAP 09/11/18  IMPRESSION: 1. Central perihilar left  upper lobe 5.4 cm lung mass, new since 01/27/2017 chest CT, compatible with malignancy, favor primary bronchogenic carcinoma. Postobstructive pneumonia/atelectasis in the lingula. 2. Mediastinal adenopathy in the left prevascular/AP window and subcarinal stations, compatible with metastatic nodes (more likely originating from the left upper lobe lung mass). 3. Trace dependent left pleural effusion. 4. Apple-core lesion in the ascending colon, compatible with known primary colonic neoplasm. Adjacent top-normal size right mesenteric lymph node is equivocal for locoregional nodal metastasis. No additional potential findings of metastatic disease in the abdomen or pelvis. 5. Chronic findings include: Aortic Atherosclerosis (ICD10-I70.0). Borderline mild cardiomegaly. One vessel coronary atherosclerosis. Dilated main pulmonary artery, suggesting pulmonary arterial hypertension. Left adrenal adenoma. Punctate bilateral nephrolithiasis. Small hiatal hernia.    09/14/2018 Initial Biopsy   Diagnosis 5/5.20 Lung, biopsy, LUL - SMALL CELL CARCINOMA.   09/14/2018 Cancer Staging   Staging form: Lung, AJCC 8th Edition - Clinical stage from 09/14/2018: Stage IIIB (cT3, cN2, cM0) - Signed by Truitt Merle, MD on 09/28/2018   09/15/2018 Initial Diagnosis   Small cell lung cancer, left upper lobe ([place])   09/18/2018 -  Chemotherapy   carboplatin and etoposide every 4 weeks for 4 cycles starting 09/18/18. Start concurrent chemoRt on 10/11/18.    09/20/2018 Imaging   Brain MRI 09/20/18  IMPRESSION: 1. Motion degraded examination without brain metastases identified. 2. Small skull metastases not excluded. 3. Moderate to severe chronic small vessel ischemic disease.     Radiation Therapy   Pending Concurrent chemoRT starting 10/11/18      CURRENT THERAPY:  Carboplatin and etoposideevery 3 weeks for 4 cycles  starting 09/18/18.Concurrent chemoRt starting on 10/11/18, and held after one dose due to COVID-19(+)  INTERVAL HISTORY:  Stephanie Combs is here for a follow up status post recent hospitalization for COVID-19 and fluid overload. She presents to the clinic alone. She notes pain in her buttocks after a boil was lanced during her hospital stay. She notes significant nausea and abdominal pain after eating and decreased appetite, for which she has been taking Zofran. She denies vomiting. Her buttocks wound has mostly closed up. She has not had an at-home nurse changing her dressing, so her daughter has been helping her. She requested a hospital bed in her home and an at home nurse. She is very distressed about treatment side effects and requested switching treatment to less potent chemotherapy while also expressing significant concern about her prognosis.    REVIEW OF SYSTEMS:    Constitutional: Denies fevers, chills or abnormal weight loss (+) loss of appetite Eyes: Denies blurriness of vision Ears, nose, mouth, throat, and face: Denies mucositis or sore throat Respiratory: Denies cough, dyspnea or wheezes Cardiovascular: Denies palpitation, chest discomfort (+) lower extremity swelling Gastrointestinal:  Denies heartburn or change in bowel habits (+) abd pain (+) nausea MSK:(+) pain in buttocks Skin: Denies abnormal skin rashes Lymphatics: Denies new lymphadenopathy or easy bruising Neurological:Denies numbness, tingling or new weaknesses Behavioral/Psych: Mood is stable, no new changes  All other systems were reviewed with the patient and are negative.  MEDICAL HISTORY:  Past Medical History:  Diagnosis Date  . Anxiety   . CAD (coronary artery disease)    a. s/p DES to LAD and angioplasty to D1 in 05/2017  . CHF (congestive heart failure) (H[facility])    Diastolic  . Chronic back pain   . Chronic pain   . COPD (chronic obstructive pulmonary disease) ([place])   . Degenerative disk disease   .  Diabetes mellitus without complication (  [place])   . History of medication noncompliance 11/2017   "the patient frequently self-adjusts medications without notifying her physicians"  . Hypertension   . Hypothyroidism   . On home O2    2.5 L N/C prn  . Pedal edema     SURGICAL HISTORY: Past Surgical History:  Procedure Laterality Date  . BIOPSY  09/09/2018   Procedure: BIOPSY;  Surgeon: Daneil Dolin, MD;  [facility];  Service: Endoscopy;;  gastric esophageal hepatic flexure colon  . COLONOSCOPY WITH PROPOFOL N/A 09/09/2018   Procedure: COLONOSCOPY WITH PROPOFOL;  Surgeon: Daneil Dolin, MD;  [facility];  Service: Endoscopy;  Laterality: N/A;  . CORONARY STENT INTERVENTION N/A 05/22/2017   Procedure: CORONARY STENT INTERVENTION;  Surgeon: Martinique, Peter M, MD;  Location: [place] [facility];  Service: Cardiovascular;  Laterality: N/A;  . ESOPHAGOGASTRODUODENOSCOPY (EGD) WITH PROPOFOL N/A 09/09/2018   Procedure: ESOPHAGOGASTRODUODENOSCOPY (EGD) WITH PROPOFOL;  Surgeon: Daneil Dolin, MD;  [facility];  Service: Endoscopy;  Laterality: N/A;  . HERNIA REPAIR    . POLYPECTOMY  09/09/2018   Procedure: POLYPECTOMY;  Surgeon: Daneil Dolin, MD;  [facility];  Service: Endoscopy;;  colon  . RIGHT/LEFT HEART CATH AND CORONARY ANGIOGRAPHY N/A 05/19/2017   Procedure: RIGHT/LEFT HEART CATH AND CORONARY ANGIOGRAPHY;  Surgeon: Leonie Man, MD;  Location: [place] [facility];  Service: Cardiovascular;  Laterality: N/A;  . TUBAL LIGATION    . VIDEO BRONCHOSCOPY WITH ENDOBRONCHIAL ULTRASOUND Left 09/14/2018   Procedure: VIDEO BRONCHOSCOPY WITH ENDOBRONCHIAL ULTRASOUND with biopsies;  Surgeon: Margaretha Seeds, MD;  Location: [facility];  Service: Thoracic;  Laterality: Left;    I have reviewed the social history and family history with the patient and they are unchanged from previous note.  ALLERGIES:  is allergic to dilaudid [hydromorphone hcl]; midodrine  hcl; actifed cold-allergy [chlorpheniramine-phenylephrine]; doxycycline; other; penicillins; reglan [metoclopramide]; valium [diazepam]; and vistaril [hydroxyzine hcl].  MEDICATIONS:  Current Outpatient Medications  Medication Sig Dispense Refill  . albuterol (PROAIR HFA) 108 (90 Base) MCG/ACT inhaler Inhale 2 puffs into the lungs every 4 (four) hours as needed for wheezing or shortness of breath. For shortness of breath 18 g 0  . albuterol (PROVENTIL) (2.5 MG/3ML) 0.083% nebulizer solution Take 2.5 mg by nebulization 2 (two) times daily as needed for wheezing or shortness of breath.     . ALPRAZolam (XANAX) 1 MG tablet Take 1 mg by mouth 3 (three) times daily as needed for anxiety or sleep.     Marland Kitchen aspirin EC 81 MG tablet Take 81 mg by mouth daily.    Marland Kitchen atorvastatin (LIPITOR) 80 MG tablet Take 1 tablet (80 mg total) by mouth daily at 6 PM. 30 tablet 0  . cholecalciferol (VITAMIN D3) 25 MCG (1000 UT) tablet Take 5,000 Units by mouth daily.    . furosemide (LASIX) 40 MG tablet Take 60 mg (1 1/2 tabs) in the morning and 40 mg (1 tab) in the afternoon 75 tablet 6  . gabapentin (NEURONTIN) 600 MG tablet Take 600 mg by mouth 3 (three) times daily.     . Hydrocortisone (GERHARDT'S BUTT CREAM) CREA Apply 1 application topically 3 (three) times daily. (Patient taking differently: Apply 1 application topically 4 (four) times daily as needed for irritation. ) 90 each 0  . insulin aspart (NOVOLOG) 100 UNIT/ML injection Inject 1-18 Units into the skin 3 (three) times daily with meals. (Patient taking differently: Inject 0-20 Units into the skin  4 (four) times daily -  before meals and at bedtime. )    . insulin detemir (LEVEMIR) 100 UNIT/ML injection Inject 0.4 mLs (40 Units total) into the skin at bedtime. 10 mL 0  . levothyroxine (SYNTHROID, LEVOTHROID) 200 MCG tablet Take 1 tablet by mouth daily before breakfast.     . nystatin (MYCOSTATIN/NYSTOP) powder Apply topically 2 (two) times daily. (Patient taking  differently: Apply 1 g topically 2 (two) times daily. ) 15 g 0  . Omega-3 Fatty Acids (FISH OIL PO) Take 1 capsule by mouth daily.     . ondansetron (ZOFRAN) 8 MG tablet Take 1 tablet (8 mg total) by mouth every 8 (eight) hours as needed for nausea or vomiting. 120 tablet 0  . oxyCODONE-acetaminophen (PERCOCET) 10-325 MG tablet Take 1 tablet by mouth every 6 (six) hours as needed for pain.    . OXYGEN Inhale 2 L into the lungs continuous.    . pantoprazole (PROTONIX) 40 MG tablet Take 1 tablet (40 mg total) by mouth 2 (two) times daily. 180 tablet 3  . polyethylene glycol (MIRALAX / GLYCOLAX) 17 g packet Take 17 g by mouth 2 (two) times daily. 14 each 0  . potassium chloride (MICRO-K) 10 MEQ CR capsule Take 2 capsules (20 mEq total) by mouth 2 (two) times daily. *May take one additional tablet as needed for cramping 120 capsule 3  . senna (SENOKOT) 8.6 MG TABS tablet Take 2 tablets (17.2 mg total) by mouth 2 (two) times daily with a meal. 120 each 0  . SURE COMFORT INS SYR 1CC/28G 28G X 1/2" 1 ML MISC USE TO INJECT INSULIN UP TO 4 TIMES DAILY. 150 each 5  . tiZANidine (ZANAFLEX) 4 MG tablet Take 1 tablet (4 mg total) by mouth every 8 (eight) hours as needed for muscle spasms. 30 tablet 0  . topiramate (TOPAMAX) 25 MG tablet Take 75 mg by mouth at bedtime.     Marland Kitchen umeclidinium-vilanterol (ANORO ELLIPTA) 62.5-25 MCG/INH AEPB Inhale 1 puff into the lungs daily. 30 each 0  . allopurinol (ZYLOPRIM) 300 MG tablet Take 1 tablet (300 mg total) by mouth daily for 15 days. 30 tablet 0  . nitroGLYCERIN (NITROSTAT) 0.4 MG SL tablet Place 0.4 mg under the tongue every 5 (five) minutes as needed for chest pain.      No current facility-administered medications for this visit.    Facility-Administered Medications Ordered in Other Visits  Medication Dose Route Frequency Provider Last Rate Last Dose  . sodium chloride flush (NS) 0.9 % injection 10 mL  10 mL Intracatheter PRN Truitt Merle, MD   10 mL at 11/03/18 1323     PHYSICAL EXAMINATION: ECOG PERFORMANCE STATUS: 3 - Symptomatic, >50% confined to bed  Vitals:   11/03/18 0856  BP: (!) 125/57  Pulse: 73  Resp: 20  Temp: (!) 97.1 F (36.2 C)  SpO2: 95%   Filed Weights   11/03/18 0856  Weight: 295 lb 12.8 oz (134.2 kg)    GENERAL:alert, no distress and comfortable in wheelchair, morbidly obese female  SKIN: skin color, texture, turgor are normal, no rashes or significant lesions (not able to exam her buttocks)  EYES: normal, Conjunctiva are pink and non-injected, sclera clear LUNGS: clear to auscultation and percussion with normal breathing effort HEART: regular rate & rhythm and no murmurs (+) lower extremity edema ABDOMEN:abdomen soft, non-tender and normal bowel sounds Musculoskeletal:no cyanosis of digits and no clubbing  NEURO: alert & oriented x 3 with fluent speech (+)  wheelchair bound   LABORATORY DATA:  I have reviewed the data as listed CBC Latest Ref Rng & Units 11/03/2018 10/30/2018 10/29/2018  WBC 4.0 - 10.5 K/uL 7.8 5.9 5.0  Hemoglobin 12.0 - 15.0 g/dL 10.5(L) 9.4(L) 9.3(L)  Hematocrit 36.0 - 46.0 % 34.8(L) 29.6(L) 30.1(L)  Platelets 150 - 400 K/uL 335 283 282     CMP Latest Ref Rng & Units 11/03/2018 10/30/2018 10/29/2018  Glucose 70 - 99 mg/dL 160(H) 337(H) 226(H)  BUN 8 - 23 mg/dL 40(H) 23 20  Creatinine 0.44 - 1.00 mg/dL 2.29(H) 1.09(H) 1.08(H)  Sodium 135 - 145 mmol/L 140 138 137  Potassium 3.5 - 5.1 mmol/L 4.7 4.5 4.1  Chloride 98 - 111 mmol/L 103 102 100  CO2 22 - 32 mmol/L '27 27 25  ' Calcium 8.9 - 10.3 mg/dL 9.1 8.6(L) 8.5(L)  Total Protein 6.5 - 8.1 g/dL 7.1 5.7(L) 5.9(L)  Total Bilirubin 0.3 - 1.2 mg/dL 0.5 0.2(L) 0.3  Alkaline Phos 38 - 126 U/L 115 90 94  AST 15 - 41 U/L 16 14(L) 17  ALT 0 - 44 U/L '16 20 24      ' RADIOGRAPHIC STUDIES: I have personally reviewed the radiological images as listed and agreed with the findings in the report. No results found.   ASSESSMENT & PLAN:  KATYE VALEK is a 68  y.o. female with   1. Small Cell Lung cancer, LUL, cT3N2M0, stage III, limited stage  -Newly diagnosedin 5/2020during her work up for colon cancer -Ipreviouslydiscussed her lung cancer would take priority in terms of treatment given theaggressivenessof small cell lung cancer, and very high risk of recurrence evenwith curative  intentconcurrentchemoradiation.  -The goal of therapy is curative. But unfortunately she hassynchronized colon cancer, which is incurable because she is not a candidate for surgerydue to hermedical comorbilities -She started first cyclechemoas inpatient on 09/18/18. -Her concurrent chemoRT with cycle 2 chemo was held after day 1 treatment due to worsening Cr, CHF, edema and positive for COVID-19. She was treated for the above in hospital for 3 weeks.  -Lengthy discussion with patient about the prognosis of her small cell lung cancer, due to the delay of her treatment, multiple severe comorbidities, especially CHF and CKD, to reduce her chemotherapy dose significantly, radiation is still on hold, her cancer therefore may not be curable.  Patient was very upset, and also wants me to change chemo regiment, because the hospital nurse told her "it's a very old regimen" and it made her have persistent GI symptoms (I am not sure if her GI symptoms are all related to chemo, could be multifactorial due to her cancer and COVID-19).  We discussed that she is a poor candidate for intensive chemo and radiation, patient is very high on treatment.  We discussed palliative care and hospice, she is clearly not ready. After the lengthy discussion, she agrees to continue current chemo regimen  -Lab reviewed, Cr 2.29 today, much worse than last week.  -Continue withcycle 3 carbo/etopside today at reduced dose  2. 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   -Unfortunately she is not a candidate for colon cancer surgery due to very high risk ofsurgical mortality.She was seen by surgery during her recent hospitalization.  -I discussed the incurablenature of her colon cancer without surgery. -Willcontinueto monitor on current treatment for her SCLC. -if she recovers from chemoRT for SLCL, will consider low intensity chemo such as Xeloda or Irinotecan (which  works for [employer] also). Given normal MMR, she is not a candidate for immunotherapy. Given primary right side colon cancer, not much benefit from EGFR inhibitors even if KRAS/NRAS/BRAF wild type. -I previously orderedFO on her biopsy sample to see if she has any other targeted therapy options   3. 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  -CBC shows Hg at10.5today (11/03/18), continue monitoring   4. CAD, CHF, HTN, significant LE edema  -OnLipitor, lasix, nitro -Continue to f/u with cardiologist. -Her CHF and fluid overload has much improved during her recent hospitalization.  -She has been on 65m Lasix in the morning and 218mLasix at night since her hospital dishcarge. She is down 42lbs today since 10/11/18. She responded well to iv lasix  -Due to her worsening renal function, oral Lasix for now, and only give her IV Lasix 20 mg for today, and next two days, then resume oral lasix 4060maily  -will f/u renal function closely    5.DM,hypothyroidism, morbid obesity -On insulin novolog and long acting levemir. OnLevothyroxine. -Willmonitor glucose closely during her chemo treatment  6.Rheumatoidarthritis, chronic back pain  -On Percocet which she takes 4 times a day.  .-She is also hasGabapentin.  -She use to receiveinjectionfor her back -Pain chronic, stable. She will continue to see her RA and pain specialist.   7. Depression and anxiety -She has been  more depressed with recent diagnosis of 2 cancers -She is currently on Celebrex, will continue.  -She notes she still has faith and understands her prognosis. I encouraged her to continue her faith.   8.Goal of care discussion, DNR/DNI -We previously discussed the incurable nature of her cancer, and the overall poor prognosis, especially ifshe does not have good response to chemotherapy or progress on chemo -The patient understands the goal of care is palliative. -I recommend DNR/DNI,she has agreed to DRI/DNR.She has written will and has established her daughter as her POA.   9. Buttock abscess -Developed while in the hospital. Spontaneous drainage on 6/13. General surgery consulted, Dr. CorBrantley Stagealuated the patient, and performed further bedside incision and drainage. she was treated with iv clindamycin  -I was not able to exam her due to difficulty position. Per pt, it's very small, and improving. Her daughter changes her dressing daily   10. Acute on chronic renal failure  -Cr was 1.09 last week on hospital discharge  -Cr went up to 2/39 today  -stop oral lasix (19m4m and 20mg67m and give iv lasix 20mg 8my for today, tomorrow and 6/26, then resume oral lasix 19mg d3m  -f/u lab in 2 days and next Monday   11. Recent COVID-19 -She was incidentally found to be positive for COVID-19 on 10/11/2018, no respiratory symptoms or evidence of pneumonia, she has GI symptoms which could be related -she is scheduled to be retested tomorrow   12. Social Support  -She has help from her daughter, friend and neighbor  -She has home aid service at home but given her poor performance status and diagnosis she is requesting more hours for support. I will contact them to discuss her condition.   Plan: -Proceed with cycle 2 carbo/etopside today, tomorrow, and Friday at reduced dose for both  -will hold oral lasix and give iv lasix 20mg da55mX3 along with chemo  -Neulasta on 11/08/18  -Labs and f/u with me on 11/08/18 -[facility] -If COVID-19  negative, proceed with radiation on Monday 11/08/18 -pt requested a hospital bed, will contact [place]  -I left a message for her daughter [name] to update her condition    No problem-specific Assessment & Plan notes found for this encounter.   No orders of the defined types were placed in this encounter.  All questions were answered. The patient knows to call the clinic with any problems, questions or concerns. No barriers to learning was detected. I spent 40 minutes counseling the patient face to face. The total time spent in the appointment was 50 minutes and more than 50% was on counseling and review of test results     Truitt Merle, MD 11/03/2018   I, Molly Dorshimer, am acting as scribe for Truitt Merle, MD.   I have reviewed the above documentation for accuracy and completeness, and I agree with the above.

## 2018-10-29 NOTE — Progress Notes (Signed)
Inpatient Diabetes Program Recommendations  AACE/ADA: New Consensus Statement on Inpatient Glycemic Control (2015)  Target Ranges:  Prepandial:   less than 140 mg/dL      Peak postprandial:   less than 180 mg/dL (1-2 hours)      Critically ill patients:  140 - 180 mg/dL   Lab Results  Component Value Date   GLUCAP 220 (H) 10/29/2018   HGBA1C 7.5 (H) 08/30/2018    Review of Glycemic Control  FBS 220 mg/dL this am May benefit from increasing basal insulin.  Inpatient Diabetes Program Recommendations:     Increase Levemir to 14 units QHS Novolog 2-3 units tidwc if pt eating > 50% meal  Continue to follow.  Thank you. Lorenda Peck, RD, LDN, CDE Inpatient Diabetes Coordinator [phone]

## 2018-10-29 NOTE — Progress Notes (Signed)
Patient offered a bath and declined. states she received a bath later on day shift and would not want a bath

## 2018-10-30 DIAGNOSIS — G894 Chronic pain syndrome: Secondary | ICD-10-CM

## 2018-10-30 LAB — COMPREHENSIVE METABOLIC PANEL
ALT: 20 U/L (ref 0–44)
AST: 14 U/L — ABNORMAL LOW (ref 15–41)
Albumin: 2.7 g/dL — ABNORMAL LOW (ref 3.5–5.0)
Alkaline Phosphatase: 90 U/L (ref 38–126)
Anion gap: 9 (ref 5–15)
BUN: 23 mg/dL (ref 8–23)
CO2: 27 mmol/L (ref 22–32)
Calcium: 8.6 mg/dL — ABNORMAL LOW (ref 8.9–10.3)
Chloride: 102 mmol/L (ref 98–111)
Creatinine, Ser: 1.09 mg/dL — ABNORMAL HIGH (ref 0.44–1.00)
GFR calc Af Amer: >60 mL/min (ref >60)
GFR calc non Af Amer: 52 mL/min — ABNORMAL LOW (ref >60)
Glucose, Bld: 337 mg/dL — ABNORMAL HIGH (ref 70–99)
Potassium: 4.5 mmol/L (ref 3.5–5.1)
Sodium: 138 mmol/L (ref 135–145)
Total Bilirubin: 0.2 mg/dL — ABNORMAL LOW (ref 0.3–1.2)
Total Protein: 5.7 g/dL — ABNORMAL LOW (ref 6.5–8.1)

## 2018-10-30 LAB — CBC
HCT: 29.6 % — ABNORMAL LOW (ref 36.0–46.0)
Hemoglobin: 9.4 g/dL — ABNORMAL LOW (ref 12.0–15.0)
MCH: 30.6 pg (ref 26.0–34.0)
MCHC: 31.8 g/dL (ref 30.0–36.0)
MCV: 96.4 fL (ref 80.0–100.0)
Platelets: 283 10*3/uL (ref 150–400)
RBC: 3.07 MIL/uL — ABNORMAL LOW (ref 3.87–5.11)
RDW: 21 % — ABNORMAL HIGH (ref 11.5–15.5)
WBC: 5.9 10*3/uL (ref 4.0–10.5)
nRBC: 0 % (ref 0.0–0.2)

## 2018-10-30 LAB — GLUCOSE, CAPILLARY: Glucose-Capillary: 249 mg/dL — ABNORMAL HIGH (ref 70–99)

## 2018-10-30 MED ORDER — FUROSEMIDE 40 MG PO TABS: ORAL_TABLET | ORAL | 6 refills | Status: DC

## 2018-10-30 NOTE — Discharge Summary (Signed)
Physician Discharge Summary  LAKYN ALSTEEN PIR:[id number] DOB: 1950/11/25 DOA: 10/11/2018  PCP: [name], MD  Admit date: 10/11/2018 Discharge date: 10/30/2018  Admitted From: Home Disposition:  Home   Recommendations for Outpatient Follow-up:  1. Follow up with PCP by phone in 2-3 days 2. Follow up with Oncology to arrange next appointment 3. Please obtain BMP and CBC in 1 week to follow up Cr/K on diuretic, Hgb after transfusion     Home Health: Yes  Equipment/Devices: None  Discharge Condition: Fair  CODE STATUS: DO NOT RESUSCITATE Diet recommendation: Cardiac  Brief/Interim Summary: [name] is a 68 y.o. F with dCHF and COPD on 2L home O2, both colon and small cell lung CA on carboplat/etoposide/radiation with Dr. Burr Medico, CAD, HTN, DM, hypothyroidism, obesity, IDA, RA not on DMARD and depression who presented with SOB.  Directly admitted from [facility], positive CoV, CXR negative, on baseline O2.  Diagnosed with acute CHF, started diuresis.      PRINCIPAL HOSPITAL DIAGNOSIS: Acute on chronic diastolic CHF     Discharge Diagnoses:    Acute on chronic diastolic CHF Admitted and diuresed for 1 week, Lasix held 6/9 due to creatinine bump.  Still appeared fluid overloaded and so diuresis resumed.  Weight 331 on admission --> 294 at discharge.  Cr and K normal. Discharged on Lasix 60 in AM and 40 in PM      Coronavirus gastroenteritis without acute hypoxic respiratory failure In setting of ongoing 2020 COVID-19 pandemic.  No clinical evidence of pneumonia, respiratory status stable, on 2L O2 by  which is her baseline.  Treated with zinc and vitamin C. No clinical change over 14 days in hospital.      Buttock abscess Developed while in the hospital.  Spontaneous drainage on 6/13.  General surgery consulted, Dr. Brantley Stage evaluated the patient, and performed further bedside incision and drainage.    Completed 7 days IV clindamycin.  WBC  normalized, abscess is improved.  Small cell lung cancer Colon cancer  Pancytopenia WBC normalized after Granix. Transfused 4 units total during hospitalization (6/5, 6/12 and 6/15).  Hgb subsequently stable, ~9 g/dL at discharge.  No clinical bleeding.  CKD stage III  Right leg pain DVT US negative.  Likely musculoskeletal.  Diabetes with polyneuropathy  Hypertension Coronary artery disease secondary prevention  COPD with chronic hypoxic respiratory failure  Rheumatoid arthritis Not on DMARD at baseline  Depression  Hypothyroidism  Morbid obesity BMI 54 kg/m2            Discharge Instructions  Discharge Instructions    Diet - low sodium heart healthy   Complete by: As directed    Discharge instructions   Complete by: As directed    From Dr. Loleta Books: You were admitted with a flare of congestive heart failure. You were treated with IV Lasix and we removed 40 lbs of fluid.  You should increase your Lasix dose to 60 mg (1 and 1/2 tabs) in the morning and 40 mg (1 tab) at night If the home health agency can't check your lab work next week, you should call your primary care doctor to arrange labs in 1 week  Call your primary care doctor for a follow up appointment in 1 week Call your Cardiologist for a follow up appointment in 3-4 weeks Call your oncologist to make sure you have a follow up appointment pending   Increase activity slowly   Complete by: As directed      Allergies as of 10/30/2018  Reactions   Dilaudid [hydromorphone Hcl] Itching   Midodrine Hcl Swelling   After one dose had anaphylactic reaction, had to call EMS.   Actifed Cold-allergy [chlorpheniramine-phenylephrine]    "I was sick and red and it didn't agree with me at all"   Doxycycline Nausea And Vomiting   Other Cough   Pt states she is allergic to ragweed and that she starts coughing and sneezing like crazy   Penicillins Hives, Itching   Tolerates Rocephin Has  patient had a PCN reaction causing immediate rash, facial/tongue/throat swelling, SOB or lightheadedness with hypotension: Yes Has patient had a PCN reaction causing severe rash involving mucus membranes or skin necrosis: No Has patient had a PCN reaction that required hospitalization Yes Has patient had a PCN reaction occurring within the last 10 years: No If all of the above answers are "NO", then may proceed with Cephalosporin use.   Reglan [metoclopramide] Itching   Valium [diazepam] Itching   Vistaril [hydroxyzine Hcl] Itching      Medication List    STOP taking these medications   docusate sodium 100 MG capsule Commonly known as: COLACE     TAKE these medications   albuterol (2.5 MG/3ML) 0.083% nebulizer solution Commonly known as: PROVENTIL Take 2.5 mg by nebulization 2 (two) times daily as needed for wheezing or shortness of breath.   albuterol 108 (90 Base) MCG/ACT inhaler Commonly known as: ProAir HFA Inhale 2 puffs into the lungs every 4 (four) hours as needed for wheezing or shortness of breath. For shortness of breath   allopurinol 300 MG tablet Commonly known as: ZYLOPRIM Take 1 tablet (300 mg total) by mouth daily for 15 days.   ALPRAZolam 1 MG tablet Commonly known as: XANAX Take 1 mg by mouth 3 (three) times daily as needed for anxiety or sleep.   aspirin EC 81 MG tablet Take 81 mg by mouth daily.   atorvastatin 80 MG tablet Commonly known as: LIPITOR Take 1 tablet (80 mg total) by mouth daily at 6 PM.   cholecalciferol 25 MCG (1000 UT) tablet Commonly known as: VITAMIN D3 Take 5,000 Units by mouth daily.   FISH OIL PO Take 1 capsule by mouth daily.   furosemide 40 MG tablet Commonly known as: LASIX Take 60 mg (1 1/2 tabs) in the morning and 40 mg (1 tab) in the afternoon What changed:   how much to take  how to take this  when to take this  additional instructions   gabapentin 600 MG tablet Commonly known as: NEURONTIN Take 600 mg by  mouth 3 (three) times daily.   Gerhardt's butt cream Crea Apply 1 application topically 3 (three) times daily. What changed:   when to take this  reasons to take this   insulin aspart 100 UNIT/ML injection Commonly known as: novoLOG Inject 1-18 Units into the skin 3 (three) times daily with meals. What changed:   how much to take  when to take this   insulin detemir 100 UNIT/ML injection Commonly known as: Levemir Inject 0.4 mLs (40 Units total) into the skin at bedtime.   levothyroxine 200 MCG tablet Commonly known as: SYNTHROID Take 1 tablet by mouth daily before breakfast.   nitroGLYCERIN 0.4 MG SL tablet Commonly known as: NITROSTAT Place 0.4 mg under the tongue every 5 (five) minutes as needed for chest pain.   nystatin powder Commonly known as: MYCOSTATIN/NYSTOP Apply topically 2 (two) times daily. What changed: how much to take   ondansetron 8 MG tablet  Commonly known as: ZOFRAN Take 1 tablet (8 mg total) by mouth every 8 (eight) hours as needed for nausea or vomiting.   oxyCODONE-acetaminophen 10-325 MG tablet Commonly known as: PERCOCET Take 1 tablet by mouth every 6 (six) hours as needed for pain.   OXYGEN Inhale 2 L into the lungs continuous.   pantoprazole 40 MG tablet Commonly known as: PROTONIX Take 1 tablet (40 mg total) by mouth 2 (two) times daily.   polyethylene glycol 17 g packet Commonly known as: MIRALAX / GLYCOLAX Take 17 g by mouth 2 (two) times daily.   potassium chloride 10 MEQ CR capsule Commonly known as: MICRO-K Take 2 capsules (20 mEq total) by mouth 2 (two) times daily. *May take one additional tablet as needed for cramping   senna 8.6 MG Tabs tablet Commonly known as: SENOKOT Take 2 tablets (17.2 mg total) by mouth 2 (two) times daily with a meal.   SURE COMFORT INS SYR 1CC/28G 28G X 1/2" 1 ML Misc Generic drug: INS SYRINGE/NEEDLE 1CC/28G USE TO INJECT INSULIN UP TO 4 TIMES DAILY.   tiZANidine 4 MG tablet Commonly  known as: ZANAFLEX Take 1 tablet (4 mg total) by mouth every 8 (eight) hours as needed for muscle spasms.   topiramate 25 MG tablet Commonly known as: TOPAMAX Take 75 mg by mouth at bedtime.   umeclidinium-vilanterol 62.5-25 MCG/INH Aepb Commonly known as: ANORO ELLIPTA Inhale 1 puff into the lungs daily.      [facility] Follow up.   Specialty: Home Health Services Why: Home Health registered nurse, physical and occupational therapy, home health aide Contact information: [name] [address] [phone]        [facility], [place] Follow up on 11/18/2018.   Specialty: General Surgery Why: 4:00pm, arrive by 3:30pm for paperwork and check in.  Please bring photo ID and insurance card Contact information: [address] [place] [address] [phone]          Allergies  Allergen Reactions  . Dilaudid [Hydromorphone Hcl] Itching  . Midodrine Hcl Swelling    After one dose had anaphylactic reaction, had to call EMS.  Marland Kitchen Actifed Cold-Allergy [Chlorpheniramine-Phenylephrine]     "I was sick and red and it didn't agree with me at all"  . Doxycycline Nausea And Vomiting  . Other Cough    Pt states she is allergic to ragweed and that she starts coughing and sneezing like crazy  . Penicillins Hives and Itching    Tolerates Rocephin Has patient had a PCN reaction causing immediate rash, facial/tongue/throat swelling, SOB or lightheadedness with hypotension: Yes Has patient had a PCN reaction causing severe rash involving mucus membranes or skin necrosis: No Has patient had a PCN reaction that required hospitalization Yes Has patient had a PCN reaction occurring within the last 10 years: No If all of the above answers are "NO", then may proceed with Cephalosporin use.   . Reglan [Metoclopramide] Itching  . Valium [Diazepam] Itching  . Vistaril [Hydroxyzine Hcl] Itching     Consultations:  None   Procedures/Studies: Dg Chest Port 1 View  Result Date: 10/25/2018 CLINICAL DATA:  Shortness of breath EXAM: PORTABLE CHEST 1 VIEW COMPARISON:  10/11/2018 FINDINGS: Small right pleural effusion. Bilateral interstitial thickening. No pneumothorax. Stable cardiomegaly. No acute osseous abnormality. IMPRESSION: Small right pleural effusion bilateral interstitial thickening. Differential considerations include mild pulmonary edema versus atypical infection. Electronically Signed  By: Kathreen Devoid   On: 10/25/2018 10:59   Dg Chest Port 1 View  Result Date: 10/11/2018 CLINICAL DATA:  Viral pneumonia EXAM: PORTABLE CHEST 1 VIEW COMPARISON:  09/22/2018 FINDINGS: The cardiac size remains enlarged. There is elevation of the right hemidiaphragm. No focal consolidation. No pneumothorax. There is mild generalized volume overload. The patient is rotated which limits evaluation. There is no acute osseous abnormality. There is atelectasis at the right lung base. IMPRESSION: 1. No radiographic evidence for multifocal pneumonia or a significant area of consolidation. 2. Cardiomegaly with mild vascular congestion. Right basilar airspace opacity is presumed to represent atelectasis or chronic scarring. Electronically Signed   By: Constance Holster M.D.   On: 10/11/2018 20:10   Vas [place] Lower Extremity Venous (dvt)  Result Date: 10/26/2018  Lower Venous Study Indications: Swelling, and Pain. Other Indications: Positive Covid patient with lung and colon cancer. Anticoagulation: Lovenox. Limitations: Body habitus. Comparison Study: No prior study at this facility. Last LEV done at [facility]                   on 09/08/18, negative for DVT. Performing Technologist: Oda Cogan RDMS, RVT  Examination Guidelines: A complete evaluation includes B-mode imaging, spectral Doppler, color Doppler, and power Doppler as needed of all accessible portions of each vessel. Bilateral testing is considered an  integral part of a complete examination. Limited examinations for reoccurring indications may be performed as noted.  +---------+---------------+---------+-----------+----------+--------------+ RIGHT    CompressibilityPhasicitySpontaneityPropertiesSummary        +---------+---------------+---------+-----------+----------+--------------+ CFV      Full           Yes      Yes                                 +---------+---------------+---------+-----------+----------+--------------+ SFJ      Full                                                        +---------+---------------+---------+-----------+----------+--------------+ FV Prox  Full                                                        +---------+---------------+---------+-----------+----------+--------------+ FV Mid   Full                                                        +---------+---------------+---------+-----------+----------+--------------+ FV DistalFull                                                        +---------+---------------+---------+-----------+----------+--------------+ PFV      Full                                                        +---------+---------------+---------+-----------+----------+--------------+  POP      Full           Yes      Yes                                 +---------+---------------+---------+-----------+----------+--------------+ PTV                                                   Not visualized +---------+---------------+---------+-----------+----------+--------------+ PERO                                                  Not visualized +---------+---------------+---------+-----------+----------+--------------+   +---------+---------------+---------+-----------+----------+--------------+ LEFT     CompressibilityPhasicitySpontaneityPropertiesSummary        +---------+---------------+---------+-----------+----------+--------------+ CFV       Full           Yes      Yes                                 +---------+---------------+---------+-----------+----------+--------------+ SFJ      Full                                                        +---------+---------------+---------+-----------+----------+--------------+ FV Prox  Full                                                        +---------+---------------+---------+-----------+----------+--------------+ FV Mid   Full                                                        +---------+---------------+---------+-----------+----------+--------------+ FV DistalFull                                                        +---------+---------------+---------+-----------+----------+--------------+ PFV      Full                                                        +---------+---------------+---------+-----------+----------+--------------+ POP      Full           Yes      Yes                                 +---------+---------------+---------+-----------+----------+--------------+  PTV                                                   Not visualized +---------+---------------+---------+-----------+----------+--------------+ PERO                                                  Not visualized +---------+---------------+---------+-----------+----------+--------------+    Summary: Right: There is no evidence of deep vein thrombosis in the lower extremity. However, portions of this examination were limited- see technologist comments above. Poorly visualized calf veins bilaterally cannot exclude DVT at this level. Left: There is no evidence of deep vein thrombosis in the lower extremity. However, portions of this examination were limited- see technologist comments above.  *See table(s) above for measurements and observations. Electronically signed by Deitra Mayo MD on 10/26/2018 at 3:43:42 PM.    Final       Subjective: Legs sore, but  no swelling.  No dyspnea, no orthopnea, no cough.  Discharge Exam: Vitals:   10/30/18 0500 10/30/18 0802  BP:  (!) 150/60  Pulse:  89  Resp:    Temp: 98.6 F (37 C) 99 F (37.2 C)  SpO2:  99%   Vitals:   10/29/18 2323 10/30/18 0431 10/30/18 0500 10/30/18 0802  BP: 137/76   (!) 150/60  Pulse:    89  Resp: 18     Temp: 99 F (37.2 C) 98.8 F (37.1 C) 98.6 F (37 C) 99 F (37.2 C)  TempSrc: Oral Oral Oral Oral  SpO2:    99%  Weight:   133.4 kg   Height:        General: Pt is alert, awake, not in acute distress, sitting in recliner, sleeping Cardiovascular: RRR, nl S1-S2, no murmurs appreciated.   No pitting of LEs.  Chronic brawny venous change.  Respiratory: Normal respiratory rate and rhythm.  Shallow respirations, but normal effort.  Lungs sounds diminished. Abdominal: Abdomen soft and non-tender.  No distension or HSM.   Neuro/Psych: Strength symmetric in upper and lower extremities.  Judgment and insight appear normal.   The results of significant diagnostics from this hospitalization (including imaging, microbiology, ancillary and laboratory) are listed below for reference.     Microbiology: Recent Results (from the past 240 hour(s))  Culture, blood (Routine X 2) w Reflex to ID Panel     Status: None   Collection Time: 10/21/18 12:20 PM   Specimen: BLOOD  Result Value Ref Range Status   Specimen Description   Final    BLOOD RIGHT HAND Performed at [facility] [address], [place], [place] [address]    Special Requests   Final    BOTTLES DRAWN AEROBIC ONLY Blood Culture adequate volume Performed at [facility] [address] [place], [place] [address]    Culture   Final    NO GROWTH 5 DAYS Performed at [facility], [place] [address] [place], [place] [address]    Report Status 10/26/2018 FINAL  Final  Culture, blood (Routine X 2) w Reflex to ID Panel     Status: None   Collection Time: 10/21/18 12:25 PM    Specimen: BLOOD  Result Value Ref Range Status  Specimen Description   Final    BLOOD LEFT HAND Performed at [facility] [address] [place], [place] [address]    Special Requests   Final    BOTTLES DRAWN AEROBIC ONLY Blood Culture adequate volume Performed at [facility] [address], [place], [place] [address]    Culture   Final    NO GROWTH 5 DAYS Performed at [facility], [place] [address] [place], [place] [address]    Report Status 10/26/2018 FINAL  Final     Labs: BNP (last 3 results) Recent Labs    08/30/18 2115 10/12/18 1220 10/13/18 0538  BNP 254.0* 535.6* 034.9*   Basic Metabolic Panel: Recent Labs  Lab 10/26/18 0356 10/27/18 0523 10/28/18 0705 10/29/18 0300 10/30/18 0314  NA 140 139 139 137 138  K 4.7 4.2 4.2 4.1 4.5  CL 100 99 101 100 102  CO2 29 29 29 25 27   GLUCOSE 150* 158* 195* 226* 337*  BUN 20 14 17 20 23   CREATININE 1.01* 1.13* 1.04* 1.08* 1.09*  CALCIUM 8.6* 8.7* 8.6* 8.5* 8.6*   Liver Function Tests: Recent Labs  Lab 10/25/18 0548 10/27/18 0523 10/28/18 0705 10/29/18 0300 10/30/18 0314  AST 31 29 19 17  14*  ALT 27 35 28 24 20   ALKPHOS 71 91 93 94 90  BILITOT 0.5 0.5 0.4 0.3 0.2*  PROT 5.5* 6.2* 5.7* 5.9* 5.7*  ALBUMIN 2.6* 3.0* 2.8* 2.9* 2.7*   No results for input(s): LIPASE, AMYLASE in the last 168 hours. No results for input(s): AMMONIA in the last 168 hours. CBC: Recent Labs  Lab 10/24/18 0325 10/25/18 0548 10/26/18 0356 10/27/18 0523 10/28/18 0705 10/29/18 0300 10/30/18 0314  WBC 2.4* 9.3 11.8* 8.9 6.1 5.0 5.9  NEUTROABS 1.0* 7.3 8.7* 6.5  --   --   --   HGB 7.2* 6.9* 8.8* 9.5* 9.1* 9.3* 9.4*  HCT 23.2* 22.7* 28.2* 31.0* 29.9* 30.1* 29.6*  MCV 94.3 95.0 94.6 96.3 96.8 97.1 96.4  PLT 102* 117* 155 216 233 282 283   Cardiac Enzymes: No results for input(s): CKTOTAL, CKMB, CKMBINDEX, TROPONINI in the last 168 hours. BNP: Invalid input(s): POCBNP CBG: Recent  Labs  Lab 10/29/18 0759 10/29/18 1204 10/29/18 1553 10/29/18 2101 10/30/18 1112  GLUCAP 220* 191* 251* 339* 249*   D-Dimer Recent Labs    10/29/18 0300  DDIMER 0.80*   Hgb A1c No results for input(s): HGBA1C in the last 72 hours. Lipid Profile No results for input(s): CHOL, HDL, LDLCALC, TRIG, CHOLHDL, LDLDIRECT in the last 72 hours. Thyroid function studies No results for input(s): TSH, T4TOTAL, T3FREE, THYROIDAB in the last 72 hours.  Invalid input(s): FREET3 Anemia work up Recent Labs    10/29/18 0300  FERRITIN 498*   Urinalysis    Component Value Date/Time   COLORURINE YELLOW 04/05/2018 1559   APPEARANCEUR HAZY (A) 04/05/2018 1559   LABSPEC >1.030 (H) 04/05/2018 1559   PHURINE 5.5 04/05/2018 1559   GLUCOSEU NEGATIVE 04/05/2018 1559   HGBUR SMALL (A) 04/05/2018 1559   BILIRUBINUR SMALL (A) 04/05/2018 1559   KETONESUR 15 (A) 04/05/2018 1559   PROTEINUR 100 (A) 04/05/2018 1559   UROBILINOGEN 0.2 12/04/2014 0650   NITRITE NEGATIVE 04/05/2018 1559   LEUKOCYTESUR SMALL (A) 04/05/2018 1559   Sepsis Labs Invalid input(s): PROCALCITONIN,  WBC,  LACTICIDVEN Microbiology Recent Results (from the past 240 hour(s))  Culture, blood (Routine X 2) w Reflex to ID Panel     Status: None  Collection Time: 10/21/18 12:20 PM   Specimen: BLOOD  Result Value Ref Range Status   Specimen Description   Final    BLOOD RIGHT HAND Performed at [facility] [address], [place], [place] [address]    Special Requests   Final    BOTTLES DRAWN AEROBIC ONLY Blood Culture adequate volume Performed at [facility] [address], [place], [place] [address]    Culture   Final    NO GROWTH 5 DAYS Performed at [facility], [place] [address], [place], [place] [address]    Report Status 10/26/2018 FINAL  Final  Culture, blood (Routine X 2) w Reflex to ID Panel     Status: None   Collection Time: 10/21/18 12:25 PM   Specimen: BLOOD   Result Value Ref Range Status   Specimen Description   Final    BLOOD LEFT HAND Performed at [facility] [address], [place], [place] [address]    Special Requests   Final    BOTTLES DRAWN AEROBIC ONLY Blood Culture adequate volume Performed at [facility] [address], [place], [place] [address]    Culture   Final    NO GROWTH 5 DAYS Performed at [facility], [place] [address], [place], [place] [address]    Report Status 10/26/2018 FINAL  Final     Time coordinating discharge: 25 minutes      SIGNED:   Edwin Dada, MD  [facility] 10/30/2018, 5:47 PM

## 2018-10-30 NOTE — Progress Notes (Signed)
Called and spoke with patient's daughter [name] regarding patient's discharge home today.  [name] reports she is not working today and will plan to arrive to the hospital at 3:30pm to transport her mom home.  She further reports she will have portable oxygen, which patient wears 2L o2 via [place] continuously at home.

## 2018-11-01 ENCOUNTER — Inpatient Hospital Stay: Payer: Medicare Other

## 2018-11-01 ENCOUNTER — Inpatient Hospital Stay: Payer: Medicare Other | Admitting: Hematology

## 2018-11-01 ENCOUNTER — Ambulatory Visit: Payer: Medicare Other

## 2018-11-01 DIAGNOSIS — E1165 Type 2 diabetes mellitus with hyperglycemia: Secondary | ICD-10-CM | POA: Diagnosis not present

## 2018-11-01 LAB — ACID FAST CULTURE WITH REFLEXED SENSITIVITIES (MYCOBACTERIA): Acid Fast Culture: NEGATIVE

## 2018-11-01 LAB — GLUCOSE, CAPILLARY: Glucose-Capillary: 250 mg/dL — ABNORMAL HIGH (ref 70–99)

## 2018-11-02 ENCOUNTER — Inpatient Hospital Stay: Payer: Medicare Other

## 2018-11-02 ENCOUNTER — Other Ambulatory Visit: Payer: Self-pay | Admitting: Radiation Oncology

## 2018-11-02 ENCOUNTER — Other Ambulatory Visit: Payer: Self-pay

## 2018-11-02 ENCOUNTER — Telehealth: Payer: Self-pay | Admitting: Internal Medicine

## 2018-11-02 ENCOUNTER — Telehealth: Payer: Self-pay | Admitting: *Deleted

## 2018-11-02 ENCOUNTER — Ambulatory Visit: Payer: Medicare Other

## 2018-11-02 ENCOUNTER — Other Ambulatory Visit: Payer: Self-pay | Admitting: Medical

## 2018-11-02 DIAGNOSIS — U071 COVID-19: Secondary | ICD-10-CM

## 2018-11-02 NOTE — Telephone Encounter (Signed)
Yesterday I signed up Pt for transportation service however, there was a note that Pt is COVID+ and I had to reach out to Alphonzo Grieve to confirm that we can proceed with transportation and she advised this morning that we can. Therefore, everything is set up for tomorrows appt with no further issues.

## 2018-11-02 NOTE — Telephone Encounter (Signed)
Arrival to [facility].

## 2018-11-02 NOTE — Telephone Encounter (Signed)
Pt advised that I can cancel her transportation services because she has a family member taking her for the rest of the week and she will call when she needs our services.

## 2018-11-02 NOTE — Patient Outreach (Signed)
[facility]) Care Management  11/02/2018  Stephanie Combs 1951/03/17 [id number]   Referral received from Inpatient Case Manager for [facility] complex case management. Initial outreach unsuccessful. Left HIPAA compliant voice message requesting a return call.  PLAN -Will follow up within 3-4 business days.  [place] [phone]

## 2018-11-02 NOTE — Telephone Encounter (Signed)
Spoke with the patient to inform her of her upcoming appointment for repeat Covid testing on Thursday 11/04/2018.  I let her know that her appointment is scheduled for 11:25 am and she needs to arrive at 11:10 am.  Will continue to follow as necessary.  Gloriajean Dell. Leonie Green, BSN

## 2018-11-03 ENCOUNTER — Inpatient Hospital Stay: Payer: Medicare Other

## 2018-11-03 ENCOUNTER — Other Ambulatory Visit: Payer: Self-pay

## 2018-11-03 ENCOUNTER — Encounter: Payer: Self-pay | Admitting: Hematology

## 2018-11-03 ENCOUNTER — Ambulatory Visit: Admission: RE | Admit: 2018-11-03 | Payer: Medicare Other | Source: Ambulatory Visit

## 2018-11-03 ENCOUNTER — Inpatient Hospital Stay (HOSPITAL_BASED_OUTPATIENT_CLINIC_OR_DEPARTMENT_OTHER): Payer: Medicare Other | Admitting: Hematology

## 2018-11-03 ENCOUNTER — Encounter: Payer: Self-pay | Admitting: General Practice

## 2018-11-03 DIAGNOSIS — L03115 Cellulitis of right lower limb: Secondary | ICD-10-CM

## 2018-11-03 DIAGNOSIS — D500 Iron deficiency anemia secondary to blood loss (chronic): Secondary | ICD-10-CM | POA: Diagnosis not present

## 2018-11-03 DIAGNOSIS — E039 Hypothyroidism, unspecified: Secondary | ICD-10-CM

## 2018-11-03 DIAGNOSIS — C183 Malignant neoplasm of hepatic flexure: Secondary | ICD-10-CM | POA: Diagnosis not present

## 2018-11-03 DIAGNOSIS — R5383 Other fatigue: Secondary | ICD-10-CM

## 2018-11-03 DIAGNOSIS — Z794 Long term (current) use of insulin: Secondary | ICD-10-CM

## 2018-11-03 DIAGNOSIS — E119 Type 2 diabetes mellitus without complications: Secondary | ICD-10-CM | POA: Diagnosis not present

## 2018-11-03 DIAGNOSIS — Z66 Do not resuscitate: Secondary | ICD-10-CM

## 2018-11-03 DIAGNOSIS — L03116 Cellulitis of left lower limb: Secondary | ICD-10-CM | POA: Diagnosis not present

## 2018-11-03 DIAGNOSIS — E669 Obesity, unspecified: Secondary | ICD-10-CM

## 2018-11-03 DIAGNOSIS — C3412 Malignant neoplasm of upper lobe, left bronchus or lung: Secondary | ICD-10-CM

## 2018-11-03 DIAGNOSIS — Z5111 Encounter for antineoplastic chemotherapy: Secondary | ICD-10-CM | POA: Diagnosis not present

## 2018-11-03 DIAGNOSIS — I10 Essential (primary) hypertension: Secondary | ICD-10-CM | POA: Diagnosis not present

## 2018-11-03 DIAGNOSIS — M549 Dorsalgia, unspecified: Secondary | ICD-10-CM | POA: Diagnosis not present

## 2018-11-03 DIAGNOSIS — F329 Major depressive disorder, single episode, unspecified: Secondary | ICD-10-CM | POA: Diagnosis not present

## 2018-11-03 DIAGNOSIS — I2510 Atherosclerotic heart disease of native coronary artery without angina pectoris: Secondary | ICD-10-CM | POA: Diagnosis not present

## 2018-11-03 DIAGNOSIS — G8929 Other chronic pain: Secondary | ICD-10-CM | POA: Diagnosis not present

## 2018-11-03 DIAGNOSIS — I509 Heart failure, unspecified: Secondary | ICD-10-CM | POA: Diagnosis not present

## 2018-11-03 DIAGNOSIS — M069 Rheumatoid arthritis, unspecified: Secondary | ICD-10-CM | POA: Diagnosis not present

## 2018-11-03 LAB — CBC WITH DIFFERENTIAL (CANCER CENTER ONLY)
Abs Immature Granulocytes: 0.06 10*3/uL (ref 0.00–0.07)
Basophils Absolute: 0.1 10*3/uL (ref 0.0–0.1)
Basophils Relative: 1 %
Eosinophils Absolute: 0.1 10*3/uL (ref 0.0–0.5)
Eosinophils Relative: 2 %
HCT: 34.8 % — ABNORMAL LOW (ref 36.0–46.0)
Hemoglobin: 10.5 g/dL — ABNORMAL LOW (ref 12.0–15.0)
Immature Granulocytes: 1 %
Lymphocytes Relative: 15 %
Lymphs Abs: 1.2 10*3/uL (ref 0.7–4.0)
MCH: 30.1 pg (ref 26.0–34.0)
MCHC: 30.2 g/dL (ref 30.0–36.0)
MCV: 99.7 fL (ref 80.0–100.0)
Monocytes Absolute: 0.6 10*3/uL (ref 0.1–1.0)
Monocytes Relative: 8 %
Neutro Abs: 5.7 10*3/uL (ref 1.7–7.7)
Neutrophils Relative %: 73 %
Platelet Count: 335 10*3/uL (ref 150–400)
RBC: 3.49 MIL/uL — ABNORMAL LOW (ref 3.87–5.11)
RDW: 21.1 % — ABNORMAL HIGH (ref 11.5–15.5)
WBC Count: 7.8 10*3/uL (ref 4.0–10.5)
nRBC: 0 % (ref 0.0–0.2)

## 2018-11-03 LAB — CMP (CANCER CENTER ONLY)
ALT: 16 U/L (ref 0–44)
AST: 16 U/L (ref 15–41)
Albumin: 3.5 g/dL (ref 3.5–5.0)
Alkaline Phosphatase: 115 U/L (ref 38–126)
Anion gap: 10 (ref 5–15)
BUN: 40 mg/dL — ABNORMAL HIGH (ref 8–23)
CO2: 27 mmol/L (ref 22–32)
Calcium: 9.1 mg/dL (ref 8.9–10.3)
Chloride: 103 mmol/L (ref 98–111)
Creatinine: 2.29 mg/dL — ABNORMAL HIGH (ref 0.44–1.00)
GFR, Est AFR Am: 25 mL/min — ABNORMAL LOW (ref >60)
GFR, Estimated: 21 mL/min — ABNORMAL LOW (ref >60)
Glucose, Bld: 160 mg/dL — ABNORMAL HIGH (ref 70–99)
Potassium: 4.7 mmol/L (ref 3.5–5.1)
Sodium: 140 mmol/L (ref 135–145)
Total Bilirubin: 0.5 mg/dL (ref 0.3–1.2)
Total Protein: 7.1 g/dL (ref 6.5–8.1)

## 2018-11-03 MED ORDER — SODIUM CHLORIDE 0.9 % IV SOLN
230.0000 mg | Freq: Once | INTRAVENOUS | Status: AC
  Administered 2018-11-03: 230 mg via INTRAVENOUS
  Filled 2018-11-03: qty 23

## 2018-11-03 MED ORDER — HEPARIN SOD (PORK) LOCK FLUSH 100 UNIT/ML IV SOLN
250.0000 [IU] | Freq: Once | INTRAVENOUS | Status: AC | PRN
  Administered 2018-11-03: 250 [IU]
  Filled 2018-11-03: qty 5

## 2018-11-03 MED ORDER — DEXAMETHASONE SODIUM PHOSPHATE 10 MG/ML IJ SOLN
10.0000 mg | Freq: Once | INTRAMUSCULAR | Status: AC
  Administered 2018-11-03: 10 mg via INTRAVENOUS

## 2018-11-03 MED ORDER — SODIUM CHLORIDE 0.9 % IV SOLN
75.0000 mg/m2 | Freq: Once | INTRAVENOUS | Status: AC
  Administered 2018-11-03: 190 mg via INTRAVENOUS
  Filled 2018-11-03: qty 9.5

## 2018-11-03 MED ORDER — PALONOSETRON HCL INJECTION 0.25 MG/5ML
INTRAVENOUS | Status: AC
  Filled 2018-11-03: qty 5

## 2018-11-03 MED ORDER — PALONOSETRON HCL INJECTION 0.25 MG/5ML
0.2500 mg | Freq: Once | INTRAVENOUS | Status: AC
  Administered 2018-11-03: 0.25 mg via INTRAVENOUS

## 2018-11-03 MED ORDER — ALLOPURINOL 300 MG PO TABS: 300.0000 mg | ORAL_TABLET | Freq: Every day | ORAL | 0 refills | Status: DC

## 2018-11-03 MED ORDER — SODIUM CHLORIDE 0.9% FLUSH
10.0000 mL | INTRAVENOUS | Status: DC | PRN
  Administered 2018-11-03: 10 mL
  Filled 2018-11-03: qty 10

## 2018-11-03 MED ORDER — FUROSEMIDE 10 MG/ML IJ SOLN
20.0000 mg | Freq: Once | INTRAMUSCULAR | Status: AC
  Administered 2018-11-03: 20 mg via INTRAVENOUS

## 2018-11-03 MED ORDER — SODIUM CHLORIDE 0.9 % IV SOLN
Freq: Once | INTRAVENOUS | Status: AC
  Administered 2018-11-03: 11:00:00 via INTRAVENOUS
  Filled 2018-11-03: qty 250

## 2018-11-03 MED ORDER — FUROSEMIDE 10 MG/ML IJ SOLN
INTRAMUSCULAR | Status: AC
  Filled 2018-11-03: qty 2

## 2018-11-03 MED ORDER — FUROSEMIDE 10 MG/ML IJ SOLN: 20.0000 mg | Freq: Once | INTRAMUSCULAR | Status: DC

## 2018-11-03 MED ORDER — DEXAMETHASONE SODIUM PHOSPHATE 10 MG/ML IJ SOLN
INTRAMUSCULAR | Status: AC
  Filled 2018-11-03: qty 1

## 2018-11-03 NOTE — Progress Notes (Signed)
Spoke w/ Dr. Burr Medico today - okay to treat w/ elevated SCr. Her carboplatin will be reduced to AUC = 3 (~230mg  today) and etoposide decreased to 75 mg/m2. She will also received furosemide 20mg  IV w/ each chemo treatment this cycle to prevent fluid overload issues. Patient will return for Neulasta injection on Monday.   Demetrius Charity, PharmD, [facility] Pharmacist Pharmacy Phone: [phone] 11/03/2018

## 2018-11-03 NOTE — Care Management (Signed)
11/03/18 1:17p. Case manager received call from [name], Education officer, museum with the [employer] Patient is at her appointment and has concerns about her Home Health. Case Manager contacted Adela Lank, [facility] Liaison with referral, He graciously accepted. Case manager updated Lelon Frohlich, and she will inform patient. Patient also instructed that she should answer her phone and also open door when therapist arrives. Patient has history of not doing either.     Ricki Miller, RN BSN Case Manager [phone]

## 2018-11-03 NOTE — Patient Instructions (Signed)
[place] Discharge Instructions for Patients Receiving Chemotherapy  Today you received the following chemotherapy agents Carboplatin and EToposide. To help prevent nausea and vomiting after your treatment, we encourage you to take your nausea medication as directed BUT NO ZOFRAN FOR 3 DAYS AFTER CHEMO.   If you develop nausea and vomiting that is not controlled by your nausea medication, call the clinic.   BELOW ARE SYMPTOMS THAT SHOULD BE REPORTED IMMEDIATELY:  *FEVER GREATER THAN 100.5 F  *CHILLS WITH OR WITHOUT FEVER  NAUSEA AND VOMITING THAT IS NOT CONTROLLED WITH YOUR NAUSEA MEDICATION  *UNUSUAL SHORTNESS OF BREATH  *UNUSUAL BRUISING OR BLEEDING  TENDERNESS IN MOUTH AND THROAT WITH OR WITHOUT PRESENCE OF ULCERS  *URINARY PROBLEMS  *BOWEL PROBLEMS  UNUSUAL RASH Items with * indicate a potential emergency and should be followed up as soon as possible.  Feel free to call the clinic you have any questions or concerns. The clinic phone number is [phone].  Please show the [place] at check-in to the Emergency Department and triage nurse.

## 2018-11-03 NOTE — Progress Notes (Signed)
Per Dr. Burr Medico, [place] to treat today with dose reduction.

## 2018-11-03 NOTE — Progress Notes (Signed)
[place] CSW Progress Notes  Request from Kathlen Mody RN to determine status of patient's post discharge [place] and DME needs.  Spoke w Midge Minium, RN CM re discharge home health needs. She didi not do the DC, but provided information and referred CSW to [employer] RN CM. Pt discharged from [facility] on 10/30/18.  Home health orders were placed by inpt MD, referral was made to [facility]) -- orders fr PT, OT, RN and aide.  No order for hospital bed placed as this was not recommended by PT or OT - did not recommend any equipment.  Inpt RN CM Ricki Miller will work on ensuring [facility] agency is available to pick up patient - will let us know which agency responds.  MEssage sent to [place].  Edwyna Shell, LCSW Clinical Social Worker Phone:  [phone]

## 2018-11-04 ENCOUNTER — Other Ambulatory Visit: Payer: Self-pay | Admitting: Radiation Oncology

## 2018-11-04 ENCOUNTER — Telehealth: Payer: Self-pay | Admitting: Hematology

## 2018-11-04 ENCOUNTER — Ambulatory Visit: Payer: Medicare Other

## 2018-11-04 ENCOUNTER — Other Ambulatory Visit (HOSPITAL_COMMUNITY)
Admission: RE | Admit: 2018-11-04 | Discharge: 2018-11-04 | Disposition: A | Discharge status: Home / Self Care | Payer: Medicare Other | Source: Ambulatory Visit | Attending: Hematology | Admitting: Hematology

## 2018-11-04 ENCOUNTER — Inpatient Hospital Stay: Payer: Medicare Other

## 2018-11-04 ENCOUNTER — Other Ambulatory Visit: Payer: Self-pay

## 2018-11-04 VITALS — BP 125/58 | HR 95 | Temp 98.7°F | Resp 16

## 2018-11-04 DIAGNOSIS — I5033 Acute on chronic diastolic (congestive) heart failure: Secondary | ICD-10-CM | POA: Diagnosis not present

## 2018-11-04 DIAGNOSIS — Z5111 Encounter for antineoplastic chemotherapy: Secondary | ICD-10-CM | POA: Diagnosis not present

## 2018-11-04 DIAGNOSIS — Z794 Long term (current) use of insulin: Secondary | ICD-10-CM | POA: Diagnosis not present

## 2018-11-04 DIAGNOSIS — I509 Heart failure, unspecified: Secondary | ICD-10-CM | POA: Diagnosis not present

## 2018-11-04 DIAGNOSIS — U071 COVID-19: Secondary | ICD-10-CM | POA: Diagnosis not present

## 2018-11-04 DIAGNOSIS — L03115 Cellulitis of right lower limb: Secondary | ICD-10-CM | POA: Diagnosis not present

## 2018-11-04 DIAGNOSIS — G8929 Other chronic pain: Secondary | ICD-10-CM | POA: Diagnosis not present

## 2018-11-04 DIAGNOSIS — L03116 Cellulitis of left lower limb: Secondary | ICD-10-CM | POA: Diagnosis not present

## 2018-11-04 DIAGNOSIS — C183 Malignant neoplasm of hepatic flexure: Secondary | ICD-10-CM | POA: Diagnosis not present

## 2018-11-04 DIAGNOSIS — I2510 Atherosclerotic heart disease of native coronary artery without angina pectoris: Secondary | ICD-10-CM | POA: Diagnosis not present

## 2018-11-04 DIAGNOSIS — Z66 Do not resuscitate: Secondary | ICD-10-CM | POA: Diagnosis not present

## 2018-11-04 DIAGNOSIS — M549 Dorsalgia, unspecified: Secondary | ICD-10-CM | POA: Diagnosis not present

## 2018-11-04 DIAGNOSIS — Z1159 Encounter for screening for other viral diseases: Secondary | ICD-10-CM | POA: Diagnosis present

## 2018-11-04 DIAGNOSIS — D631 Anemia in chronic kidney disease: Secondary | ICD-10-CM | POA: Diagnosis not present

## 2018-11-04 DIAGNOSIS — M069 Rheumatoid arthritis, unspecified: Secondary | ICD-10-CM | POA: Diagnosis not present

## 2018-11-04 DIAGNOSIS — I10 Essential (primary) hypertension: Secondary | ICD-10-CM | POA: Diagnosis not present

## 2018-11-04 DIAGNOSIS — C3412 Malignant neoplasm of upper lobe, left bronchus or lung: Secondary | ICD-10-CM | POA: Diagnosis not present

## 2018-11-04 DIAGNOSIS — R5383 Other fatigue: Secondary | ICD-10-CM | POA: Diagnosis not present

## 2018-11-04 DIAGNOSIS — C189 Malignant neoplasm of colon, unspecified: Secondary | ICD-10-CM | POA: Diagnosis not present

## 2018-11-04 DIAGNOSIS — E039 Hypothyroidism, unspecified: Secondary | ICD-10-CM | POA: Diagnosis not present

## 2018-11-04 DIAGNOSIS — D500 Iron deficiency anemia secondary to blood loss (chronic): Secondary | ICD-10-CM | POA: Diagnosis not present

## 2018-11-04 DIAGNOSIS — R112 Nausea with vomiting, unspecified: Secondary | ICD-10-CM | POA: Diagnosis not present

## 2018-11-04 DIAGNOSIS — E119 Type 2 diabetes mellitus without complications: Secondary | ICD-10-CM | POA: Diagnosis not present

## 2018-11-04 LAB — SARS CORONAVIRUS 2 (TAT 6-24 HRS): SARS Coronavirus 2: POSITIVE — AB

## 2018-11-04 MED ORDER — SODIUM CHLORIDE 0.9 % IV SOLN
Freq: Once | INTRAVENOUS | Status: AC
  Administered 2018-11-04: 08:00:00 via INTRAVENOUS
  Filled 2018-11-04: qty 250

## 2018-11-04 MED ORDER — DEXAMETHASONE SODIUM PHOSPHATE 10 MG/ML IJ SOLN
10.0000 mg | Freq: Once | INTRAMUSCULAR | Status: AC
  Administered 2018-11-04: 10 mg via INTRAVENOUS

## 2018-11-04 MED ORDER — SODIUM CHLORIDE 0.9% FLUSH
10.0000 mL | INTRAVENOUS | Status: DC | PRN
  Administered 2018-11-04: 10 mL
  Filled 2018-11-04: qty 10

## 2018-11-04 MED ORDER — SODIUM CHLORIDE 0.9 % IV SOLN
75.0000 mg/m2 | Freq: Once | INTRAVENOUS | Status: AC
  Administered 2018-11-04: 190 mg via INTRAVENOUS
  Filled 2018-11-04: qty 9.5

## 2018-11-04 MED ORDER — FUROSEMIDE 10 MG/ML IJ SOLN
20.0000 mg | Freq: Once | INTRAMUSCULAR | Status: AC
  Administered 2018-11-04: 20 mg via INTRAMUSCULAR

## 2018-11-04 MED ORDER — FUROSEMIDE 10 MG/ML IJ SOLN
INTRAMUSCULAR | Status: AC
  Filled 2018-11-04: qty 2

## 2018-11-04 MED ORDER — HEPARIN SOD (PORK) LOCK FLUSH 100 UNIT/ML IV SOLN
250.0000 [IU] | Freq: Once | INTRAVENOUS | Status: AC | PRN
  Administered 2018-11-04: 250 [IU]
  Filled 2018-11-04: qty 5

## 2018-11-04 MED ORDER — DEXAMETHASONE SODIUM PHOSPHATE 10 MG/ML IJ SOLN
INTRAMUSCULAR | Status: AC
  Filled 2018-11-04: qty 1

## 2018-11-04 NOTE — Telephone Encounter (Signed)
No los per 6/24. °

## 2018-11-04 NOTE — Patient Instructions (Signed)
[place] Discharge Instructions for Patients Receiving Chemotherapy  Today you received the following chemotherapy agents Etoposide.  To help prevent nausea and vomiting after your treatment, we encourage you to take your nausea medication as directed BUT NO ZOFRAN FOR 3 DAYS AFTER CHEMO.   If you develop nausea and vomiting that is not controlled by your nausea medication, call the clinic.   BELOW ARE SYMPTOMS THAT SHOULD BE REPORTED IMMEDIATELY:  *FEVER GREATER THAN 100.5 F  *CHILLS WITH OR WITHOUT FEVER  NAUSEA AND VOMITING THAT IS NOT CONTROLLED WITH YOUR NAUSEA MEDICATION  *UNUSUAL SHORTNESS OF BREATH  *UNUSUAL BRUISING OR BLEEDING  TENDERNESS IN MOUTH AND THROAT WITH OR WITHOUT PRESENCE OF ULCERS  *URINARY PROBLEMS  *BOWEL PROBLEMS  UNUSUAL RASH Items with * indicate a potential emergency and should be followed up as soon as possible.  Feel free to call the clinic you have any questions or concerns. The clinic phone number is [phone].  Please show the [place] at check-in to the Emergency Department and triage nurse.

## 2018-11-05 ENCOUNTER — Other Ambulatory Visit: Payer: Self-pay

## 2018-11-05 ENCOUNTER — Ambulatory Visit: Payer: Medicare Other

## 2018-11-05 ENCOUNTER — Telehealth: Payer: Self-pay | Admitting: *Deleted

## 2018-11-05 ENCOUNTER — Inpatient Hospital Stay: Payer: Medicare Other

## 2018-11-05 ENCOUNTER — Telehealth: Payer: Self-pay | Admitting: Hematology

## 2018-11-05 VITALS — BP 124/50 | HR 76 | Temp 98.2°F | Resp 18

## 2018-11-05 DIAGNOSIS — M069 Rheumatoid arthritis, unspecified: Secondary | ICD-10-CM | POA: Diagnosis not present

## 2018-11-05 DIAGNOSIS — C3412 Malignant neoplasm of upper lobe, left bronchus or lung: Secondary | ICD-10-CM | POA: Diagnosis not present

## 2018-11-05 DIAGNOSIS — I509 Heart failure, unspecified: Secondary | ICD-10-CM | POA: Diagnosis not present

## 2018-11-05 DIAGNOSIS — E039 Hypothyroidism, unspecified: Secondary | ICD-10-CM | POA: Diagnosis not present

## 2018-11-05 DIAGNOSIS — Z794 Long term (current) use of insulin: Secondary | ICD-10-CM | POA: Diagnosis not present

## 2018-11-05 DIAGNOSIS — Z66 Do not resuscitate: Secondary | ICD-10-CM | POA: Diagnosis not present

## 2018-11-05 DIAGNOSIS — D500 Iron deficiency anemia secondary to blood loss (chronic): Secondary | ICD-10-CM | POA: Diagnosis not present

## 2018-11-05 DIAGNOSIS — I2510 Atherosclerotic heart disease of native coronary artery without angina pectoris: Secondary | ICD-10-CM | POA: Diagnosis not present

## 2018-11-05 DIAGNOSIS — C183 Malignant neoplasm of hepatic flexure: Secondary | ICD-10-CM | POA: Diagnosis not present

## 2018-11-05 DIAGNOSIS — Z5111 Encounter for antineoplastic chemotherapy: Secondary | ICD-10-CM | POA: Diagnosis not present

## 2018-11-05 DIAGNOSIS — G8929 Other chronic pain: Secondary | ICD-10-CM | POA: Diagnosis not present

## 2018-11-05 DIAGNOSIS — L03116 Cellulitis of left lower limb: Secondary | ICD-10-CM | POA: Diagnosis not present

## 2018-11-05 DIAGNOSIS — R5383 Other fatigue: Secondary | ICD-10-CM | POA: Diagnosis not present

## 2018-11-05 DIAGNOSIS — L03115 Cellulitis of right lower limb: Secondary | ICD-10-CM | POA: Diagnosis not present

## 2018-11-05 DIAGNOSIS — I10 Essential (primary) hypertension: Secondary | ICD-10-CM | POA: Diagnosis not present

## 2018-11-05 DIAGNOSIS — M549 Dorsalgia, unspecified: Secondary | ICD-10-CM | POA: Diagnosis not present

## 2018-11-05 DIAGNOSIS — E119 Type 2 diabetes mellitus without complications: Secondary | ICD-10-CM | POA: Diagnosis not present

## 2018-11-05 LAB — CMP (CANCER CENTER ONLY)
ALT: 12 U/L (ref 0–44)
AST: 12 U/L — ABNORMAL LOW (ref 15–41)
Albumin: 3.5 g/dL (ref 3.5–5.0)
Alkaline Phosphatase: 96 U/L (ref 38–126)
Anion gap: 12 (ref 5–15)
BUN: 43 mg/dL — ABNORMAL HIGH (ref 8–23)
CO2: 24 mmol/L (ref 22–32)
Calcium: 8.8 mg/dL — ABNORMAL LOW (ref 8.9–10.3)
Chloride: 105 mmol/L (ref 98–111)
Creatinine: 1.8 mg/dL — ABNORMAL HIGH (ref 0.44–1.00)
GFR, Est AFR Am: 33 mL/min — ABNORMAL LOW (ref >60)
GFR, Estimated: 28 mL/min — ABNORMAL LOW (ref >60)
Glucose, Bld: 192 mg/dL — ABNORMAL HIGH (ref 70–99)
Potassium: 4.1 mmol/L (ref 3.5–5.1)
Sodium: 141 mmol/L (ref 135–145)
Total Bilirubin: 0.5 mg/dL (ref 0.3–1.2)
Total Protein: 6.8 g/dL (ref 6.5–8.1)

## 2018-11-05 LAB — CBC WITH DIFFERENTIAL (CANCER CENTER ONLY)
Abs Immature Granulocytes: 0.02 10*3/uL (ref 0.00–0.07)
Basophils Absolute: 0.1 10*3/uL (ref 0.0–0.1)
Basophils Relative: 1 %
Eosinophils Absolute: 0 10*3/uL (ref 0.0–0.5)
Eosinophils Relative: 0 %
HCT: 30.9 % — ABNORMAL LOW (ref 36.0–46.0)
Hemoglobin: 9.5 g/dL — ABNORMAL LOW (ref 12.0–15.0)
Immature Granulocytes: 0 %
Lymphocytes Relative: 16 %
Lymphs Abs: 1 10*3/uL (ref 0.7–4.0)
MCH: 30.4 pg (ref 26.0–34.0)
MCHC: 30.7 g/dL (ref 30.0–36.0)
MCV: 99 fL (ref 80.0–100.0)
Monocytes Absolute: 0.3 10*3/uL (ref 0.1–1.0)
Monocytes Relative: 4 %
Neutro Abs: 5 10*3/uL (ref 1.7–7.7)
Neutrophils Relative %: 79 %
Platelet Count: 304 10*3/uL (ref 150–400)
RBC: 3.12 MIL/uL — ABNORMAL LOW (ref 3.87–5.11)
RDW: 20.9 % — ABNORMAL HIGH (ref 11.5–15.5)
WBC Count: 6.3 10*3/uL (ref 4.0–10.5)
nRBC: 0 % (ref 0.0–0.2)

## 2018-11-05 MED ORDER — SODIUM CHLORIDE 0.9% FLUSH
10.0000 mL | INTRAVENOUS | Status: DC | PRN
  Filled 2018-11-05: qty 10

## 2018-11-05 MED ORDER — DEXAMETHASONE SODIUM PHOSPHATE 10 MG/ML IJ SOLN
10.0000 mg | Freq: Once | INTRAMUSCULAR | Status: AC
  Administered 2018-11-05: 10 mg via INTRAVENOUS

## 2018-11-05 MED ORDER — DEXAMETHASONE SODIUM PHOSPHATE 10 MG/ML IJ SOLN
INTRAMUSCULAR | Status: AC
  Filled 2018-11-05: qty 1

## 2018-11-05 MED ORDER — SODIUM CHLORIDE 0.9 % IV SOLN
Freq: Once | INTRAVENOUS | Status: AC
  Administered 2018-11-05: 08:00:00 via INTRAVENOUS
  Filled 2018-11-05: qty 250

## 2018-11-05 MED ORDER — HEPARIN SOD (PORK) LOCK FLUSH 100 UNIT/ML IV SOLN
250.0000 [IU] | Freq: Once | INTRAVENOUS | Status: DC | PRN
  Filled 2018-11-05: qty 5

## 2018-11-05 MED ORDER — FUROSEMIDE 10 MG/ML IJ SOLN
INTRAMUSCULAR | Status: AC
  Filled 2018-11-05: qty 2

## 2018-11-05 MED ORDER — SODIUM CHLORIDE 0.9 % IV SOLN
75.0000 mg/m2 | Freq: Once | INTRAVENOUS | Status: AC
  Administered 2018-11-05: 190 mg via INTRAVENOUS
  Filled 2018-11-05: qty 9.5

## 2018-11-05 MED ORDER — FUROSEMIDE 10 MG/ML IJ SOLN
20.0000 mg | Freq: Once | INTRAMUSCULAR | Status: AC
  Administered 2018-11-05: 20 mg via INTRAVENOUS

## 2018-11-05 MED ORDER — FUROSEMIDE 10 MG/ML IJ SOLN: 20.0000 mg | Freq: Once | INTRAMUSCULAR | Status: DC

## 2018-11-05 NOTE — Telephone Encounter (Signed)
Scheduled appt per 6/26 sch message - pt is aware of appt date and time .

## 2018-11-05 NOTE — Telephone Encounter (Signed)
Spoke with [name] at [facility], and was informed that pt will have her first PT visit on Sat 11/06/18.  Faxed lab orders to [facility] for home health nurse to draw lab CBC, DIFF, and CMET  For week  July 1 or July 2,  And  July 8 or July 9. Gave direct fax number to nurses desk for results to be faxed to our office. [facility]     Phone     [phone]    ;     Fax      [phone].

## 2018-11-05 NOTE — Patient Outreach (Signed)
[facility]) Care Management  11/05/2018  Stephanie Combs May 22, 1950 [id number]    Unsuccessful outreach attempt. Left voice message requesting a return call.   PLAN -Will attempt outreach next week.  [place] [phone]

## 2018-11-05 NOTE — Patient Instructions (Signed)
[place] Discharge Instructions for Patients Receiving Chemotherapy  Today you received the following chemotherapy agents Etoposide.  To help prevent nausea and vomiting after your treatment, we encourage you to take your nausea medication as directed BUT NO ZOFRAN FOR 3 DAYS AFTER CHEMO.   If you develop nausea and vomiting that is not controlled by your nausea medication, call the clinic.   BELOW ARE SYMPTOMS THAT SHOULD BE REPORTED IMMEDIATELY:  *FEVER GREATER THAN 100.5 F  *CHILLS WITH OR WITHOUT FEVER  NAUSEA AND VOMITING THAT IS NOT CONTROLLED WITH YOUR NAUSEA MEDICATION  *UNUSUAL SHORTNESS OF BREATH  *UNUSUAL BRUISING OR BLEEDING  TENDERNESS IN MOUTH AND THROAT WITH OR WITHOUT PRESENCE OF ULCERS  *URINARY PROBLEMS  *BOWEL PROBLEMS  UNUSUAL RASH Items with * indicate a potential emergency and should be followed up as soon as possible.  Feel free to call the clinic you have any questions or concerns. The clinic phone number is [phone].  Please show the [place] at check-in to the Emergency Department and triage nurse.

## 2018-11-05 NOTE — Telephone Encounter (Signed)
YF PAL moved 7/6 f/u from [facility] to [facility]. Active tx patient. Confirmed new lab/fu time with patient.

## 2018-11-06 ENCOUNTER — Inpatient Hospital Stay: Payer: Medicare Other

## 2018-11-06 DIAGNOSIS — N183 Chronic kidney disease, stage 3 (moderate): Secondary | ICD-10-CM | POA: Diagnosis not present

## 2018-11-06 DIAGNOSIS — I5022 Chronic systolic (congestive) heart failure: Secondary | ICD-10-CM | POA: Diagnosis not present

## 2018-11-06 DIAGNOSIS — E1122 Type 2 diabetes mellitus with diabetic chronic kidney disease: Secondary | ICD-10-CM | POA: Diagnosis not present

## 2018-11-06 DIAGNOSIS — I5033 Acute on chronic diastolic (congestive) heart failure: Secondary | ICD-10-CM | POA: Diagnosis not present

## 2018-11-06 DIAGNOSIS — I130 Hypertensive heart and chronic kidney disease with heart failure and stage 1 through stage 4 chronic kidney disease, or unspecified chronic kidney disease: Secondary | ICD-10-CM | POA: Diagnosis not present

## 2018-11-08 ENCOUNTER — Ambulatory Visit: Payer: Medicare Other

## 2018-11-08 ENCOUNTER — Inpatient Hospital Stay: Payer: Medicare Other

## 2018-11-08 ENCOUNTER — Inpatient Hospital Stay: Payer: Medicare Other | Admitting: Nurse Practitioner

## 2018-11-09 ENCOUNTER — Ambulatory Visit: Payer: Medicare Other

## 2018-11-09 NOTE — Progress Notes (Signed)
[place]   [phone] Fax:([phone]) [phone]   Clinic Follow up Note   Patient Care Team: [name], MD as PCP - General (Internal Medicine) Herminio Commons, MD as PCP - Cardiology (Cardiology) Neldon Labella, RN as [place] Management Truitt Merle, MD as Consulting Physician (Hematology)   I connected with Stephanie Combs on 11/11/2018 at  1:30 PM EDT by telephone visit and verified that I am speaking with the correct person using two identifiers.  I discussed the limitations, risks, security and privacy concerns of performing an evaluation and management service by telephone and the availability of in person appointments. I also discussed with the patient that there may be a patient responsible charge related to this service. The patient expressed understanding and agreed to proceed.   Patient's location:  Her home  Provider's location:  My Office   CHIEF COMPLAINT: F/u ofright colon cancerand Small cell lung cancer  SUMMARY OF ONCOLOGIC HISTORY: Oncology History Overview Note  Cancer Staging Primary cancer of hepatic flexure of colon ([place]) Staging form: Colon and Rectum, AJCC 8th Edition - Clinical stage from 09/09/2018: Stage Unknown (cTX, cN0, cM0) - Signed by Truitt Merle, MD on 09/28/2018  Small cell lung cancer, left upper lobe ([place]) Staging form: Lung, AJCC 8th Edition - Clinical stage from 09/14/2018: Stage IIIB (cT3, cN2, cM0) - Signed by Truitt Merle, MD on 09/28/2018     Primary cancer of hepatic flexure of colon [facility])   Initial Diagnosis   Malignant neoplasm of hepatic flexure ([place])   09/09/2018 Procedure   Coloscopy by Dr Gala Romney 09/09/18  IMPRESSION -Colon mass as described in the vicinity of the hepatic flexure - status post biopsy and inking. Polyps associated with this tumor scattered all the way to the cecum were not removed; multiple splenic flexure and rectal polyps were removed as described above. Left-sided  diverticulosis.  Upper endoscopy By Dr Gala Romney 09/09/18  IMPRESSION -Abnormal distal esophagus suspicious for Barrett's esophagus?biopsied. - Small hiatal hernia. - Erosive gastropathy. Biopsied. -Duodenal bulbar erosions   09/09/2018 Initial Biopsy   Diagnosis 09/09/18 1. Stomach, biopsy - GASTRIC ANTRAL MUCOSA WITH MILD NONSPECIFIC REACTIVE GASTROPATHY - GASTRIC OXYNTIC MUCOSA WITH PARIETAL CELL HYPERPLASIA AS CAN BE SEEN IN HYPERGASTRINEMIC STATES SUCH AS PROPER PATIENT IDENTIFICATION THERAPY. - WARTHIN STARRY STAIN IS NEGATIVE FOR HELICOBACTER PYLORI 2. Esophagus, biopsy - BARRETT'S ESOPHAGUS, NEGATIVE FOR DYSPLASIA 3. Colon, polyp(s), descending - TUBULAR ADENOMA - NEGATIVE FOR HIGH-GRADE DYSPLASIA OR MALIGNANCY 4. Colon, biopsy, hepatic flexure - ADENOCARCINOMA. SEE NOTE 5. Colon, polyp(s), splenic flexure - ADENOCARCINOMA. SEE NOTE - OTHER FRAGMENTS OF TUBULAR ADENOMA(S) WITH ONE SHOWING HIGH-GRADE DYSPLASIA 6. Rectum, polyp(s) - TUBULAR ADENOMA(S) - NEGATIVE FOR HIGH-GRADE DYSPLASIA OR MALIGNANCY   09/09/2018 Cancer Staging   Staging form: Colon and Rectum, AJCC 8th Edition - Clinical stage from 09/09/2018: Stage Unknown (cTX, cN0, cM0) - Signed by Truitt Merle, MD on 09/28/2018   09/11/2018 Imaging   CT CAP 09/11/18  IMPRESSION: 1. Central perihilar left upper lobe 5.4 cm lung mass, new since 01/27/2017 chest CT, compatible with malignancy, favor primary bronchogenic carcinoma. Postobstructive pneumonia/atelectasis in the lingula. 2. Mediastinal adenopathy in the left prevascular/AP window and subcarinal stations, compatible with metastatic nodes (more likely originating from the left upper lobe lung mass). 3. Trace dependent left pleural effusion. 4. Apple-core lesion in the ascending colon, compatible with known primary colonic neoplasm. Adjacent top-normal size right mesenteric lymph node is equivocal for locoregional nodal metastasis. No additional potential findings  of  metastatic disease in the abdomen or pelvis. 5. Chronic findings include: Aortic Atherosclerosis (ICD10-I70.0). Borderline mild cardiomegaly. One vessel coronary atherosclerosis. Dilated main pulmonary artery, suggesting pulmonary arterial hypertension. Left adrenal adenoma. Punctate bilateral nephrolithiasis. Small hiatal hernia.   09/20/2018 Imaging   Brain MRI 09/20/18  IMPRESSION: 1. Motion degraded examination without brain metastases identified. 2. Small skull metastases not excluded. 3. Moderate to severe chronic small vessel ischemic disease.   Small cell lung cancer, left upper lobe ([place])  09/11/2018 Imaging   CT CAP 09/11/18  IMPRESSION: 1. Central perihilar left upper lobe 5.4 cm lung mass, new since 01/27/2017 chest CT, compatible with malignancy, favor primary bronchogenic carcinoma. Postobstructive pneumonia/atelectasis in the lingula. 2. Mediastinal adenopathy in the left prevascular/AP window and subcarinal stations, compatible with metastatic nodes (more likely originating from the left upper lobe lung mass). 3. Trace dependent left pleural effusion. 4. Apple-core lesion in the ascending colon, compatible with known primary colonic neoplasm. Adjacent top-normal size right mesenteric lymph node is equivocal for locoregional nodal metastasis. No additional potential findings of metastatic disease in the abdomen or pelvis. 5. Chronic findings include: Aortic Atherosclerosis (ICD10-I70.0). Borderline mild cardiomegaly. One vessel coronary atherosclerosis. Dilated main pulmonary artery, suggesting pulmonary arterial hypertension. Left adrenal adenoma. Punctate bilateral nephrolithiasis. Small hiatal hernia.    09/14/2018 Initial Biopsy   Diagnosis 5/5.20 Lung, biopsy, LUL - SMALL CELL CARCINOMA.   09/14/2018 Cancer Staging   Staging form: Lung, AJCC 8th Edition - Clinical stage from 09/14/2018: Stage IIIB (cT3, cN2, cM0) - Signed by Truitt Merle, MD on 09/28/2018     09/15/2018 Initial Diagnosis   Small cell lung cancer, left upper lobe ([place])   09/18/2018 -  Chemotherapy   carboplatin and etoposide every 4 weeks for 4 cycles starting 09/18/18. Start concurrent chemoRt on 10/11/18.    09/20/2018 Imaging   Brain MRI 09/20/18  IMPRESSION: 1. Motion degraded examination without brain metastases identified. 2. Small skull metastases not excluded. 3. Moderate to severe chronic small vessel ischemic disease.     Radiation Therapy    RT was postponed to 11/24/18      CURRENT THERAPY:  -Carboplatin and etoposideevery3weeks for 4 cycles starting 09/18/18. Held after C2D1 due to COVID-19(+). She restarted with cycle on 11/03/18.  -RT was postponed to 11/24/18  INTERVAL HISTORY:  Stephanie Combs is here for a follow up. She restarted chemo 6/24. She notes it went well. She had very mild diarrhea for a couple of days. She notes her stomach is doing well as long as she does not eat fried or greasy foods. She notes her LE swelling is mild to moderate. She would like a prescription for a Bariatric bed to put it in her house through [place]. She feels her insurance covers it.  She is taking Lasix 76m in the AM and maybe 271min the PM. She feels she is urinating so much that she does not it take it every night. She does not have scale at home to weight herself. She notes [facility] came yesterday to do labs and was sent to [facility].    REVIEW OF SYSTEMS:   Constitutional: Denies fevers, chills or abnormal weight loss Eyes: Denies blurriness of vision Ears, nose, mouth, throat, and face: Denies mucositis or sore throat Respiratory: Denies cough, dyspnea or wheezes Cardiovascular: Denies palpitation, chest discomfort (+) Mild to moderate lower extremity swelling Gastrointestinal:  Denies nausea, heartburn or change in bowel habits Skin: Denies abnormal skin rashes Lymphatics: Denies  new lymphadenopathy or easy bruising Neurological:Denies numbness,  tingling or new weaknesses Behavioral/Psych: Mood is stable, no new changes  All other systems were reviewed with the patient and are negative.  MEDICAL HISTORY:  Past Medical History:  Diagnosis Date   Anxiety    CAD (coronary artery disease)    a. s/p DES to LAD and angioplasty to D1 in 05/2017   CHF (congestive heart failure) (HCC)    Diastolic   Chronic back pain    Chronic pain    COPD (chronic obstructive pulmonary disease) (HCC)    Degenerative disk disease    Diabetes mellitus without complication ([place])    History of medication noncompliance 11/2017   "the patient frequently self-adjusts medications without notifying her physicians"   Hypertension    Hypothyroidism    On home O2    2.5 L N/C prn   Pedal edema     SURGICAL HISTORY: Past Surgical History:  Procedure Laterality Date   BIOPSY  09/09/2018   Procedure: BIOPSY;  Surgeon: Daneil Dolin, MD;  [facility];  Service: Endoscopy;;  gastric esophageal hepatic flexure colon   COLONOSCOPY WITH PROPOFOL N/A 09/09/2018   Procedure: COLONOSCOPY WITH PROPOFOL;  Surgeon: Daneil Dolin, MD;  [facility]: [facility];  Service: Endoscopy;  Laterality: N/A;   CORONARY STENT INTERVENTION N/A 05/22/2017   Procedure: CORONARY STENT INTERVENTION;  Surgeon: Martinique, Peter M, MD;  Location: [place] [facility];  Service: Cardiovascular;  Laterality: N/A;   ESOPHAGOGASTRODUODENOSCOPY (EGD) WITH PROPOFOL N/A 09/09/2018   Procedure: ESOPHAGOGASTRODUODENOSCOPY (EGD) WITH PROPOFOL;  Surgeon: Daneil Dolin, MD;  [facility];  Service: Endoscopy;  Laterality: N/A;   HERNIA REPAIR     POLYPECTOMY  09/09/2018   Procedure: POLYPECTOMY;  Surgeon: Daneil Dolin, MD;  [facility];  Service: Endoscopy;;  colon   RIGHT/LEFT HEART CATH AND CORONARY ANGIOGRAPHY N/A 05/19/2017   Procedure: RIGHT/LEFT HEART CATH AND CORONARY ANGIOGRAPHY;  Surgeon: Leonie Man, MD;  Location: [place] [facility]  LAB;  Service: Cardiovascular;  Laterality: N/A;   TUBAL LIGATION     VIDEO BRONCHOSCOPY WITH ENDOBRONCHIAL ULTRASOUND Left 09/14/2018   Procedure: VIDEO BRONCHOSCOPY WITH ENDOBRONCHIAL ULTRASOUND with biopsies;  Surgeon: Margaretha Seeds, MD;  Location: [place];  Service: Thoracic;  Laterality: Left;    I have reviewed the social history and family history with the patient and they are unchanged from previous note.  ALLERGIES:  is allergic to dilaudid [hydromorphone hcl]; midodrine hcl; actifed cold-allergy [chlorpheniramine-phenylephrine]; doxycycline; other; penicillins; reglan [metoclopramide]; valium [diazepam]; and vistaril [hydroxyzine hcl].  MEDICATIONS:  Current Outpatient Medications  Medication Sig Dispense Refill   albuterol (PROAIR HFA) 108 (90 Base) MCG/ACT inhaler Inhale 2 puffs into the lungs every 4 (four) hours as needed for wheezing or shortness of breath. For shortness of breath 18 g 0   albuterol (PROVENTIL) (2.5 MG/3ML) 0.083% nebulizer solution Take 2.5 mg by nebulization 2 (two) times daily as needed for wheezing or shortness of breath.      allopurinol (ZYLOPRIM) 300 MG tablet Take 1 tablet (300 mg total) by mouth daily for 15 days. 30 tablet 0   ALPRAZolam (XANAX) 1 MG tablet Take 1 mg by mouth 3 (three) times daily as needed for anxiety or sleep.      aspirin EC 81 MG tablet Take 81 mg by mouth daily.     atorvastatin (LIPITOR) 80 MG tablet Take 1 tablet (80 mg total) by mouth daily at 6 PM.  30 tablet 0   cholecalciferol (VITAMIN D3) 25 MCG (1000 UT) tablet Take 5,000 Units by mouth daily.     furosemide (LASIX) 40 MG tablet Take 60 mg (1 1/2 tabs) in the morning and 40 mg (1 tab) in the afternoon 75 tablet 6   gabapentin (NEURONTIN) 600 MG tablet Take 600 mg by mouth 3 (three) times daily.      Hydrocortisone (GERHARDT'S BUTT CREAM) CREA Apply 1 application topically 3 (three) times daily. (Patient taking differently: Apply 1 application topically 4 (four)  times daily as needed for irritation. ) 90 each 0   insulin aspart (NOVOLOG) 100 UNIT/ML injection Inject 1-18 Units into the skin 3 (three) times daily with meals. (Patient taking differently: Inject 0-20 Units into the skin 4 (four) times daily -  before meals and at bedtime. )     insulin detemir (LEVEMIR) 100 UNIT/ML injection Inject 0.4 mLs (40 Units total) into the skin at bedtime. 10 mL 0   levothyroxine (SYNTHROID, LEVOTHROID) 200 MCG tablet Take 1 tablet by mouth daily before breakfast.      nitroGLYCERIN (NITROSTAT) 0.4 MG SL tablet Place 0.4 mg under the tongue every 5 (five) minutes as needed for chest pain.      nystatin (MYCOSTATIN/NYSTOP) powder Apply topically 2 (two) times daily. (Patient taking differently: Apply 1 g topically 2 (two) times daily. ) 15 g 0   Omega-3 Fatty Acids (FISH OIL PO) Take 1 capsule by mouth daily.      ondansetron (ZOFRAN) 8 MG tablet Take 1 tablet (8 mg total) by mouth every 8 (eight) hours as needed for nausea or vomiting. 120 tablet 0   oxyCODONE-acetaminophen (PERCOCET) 10-325 MG tablet Take 1 tablet by mouth every 6 (six) hours as needed for pain.     OXYGEN Inhale 2 L into the lungs continuous.     pantoprazole (PROTONIX) 40 MG tablet Take 1 tablet (40 mg total) by mouth 2 (two) times daily. 180 tablet 3   polyethylene glycol (MIRALAX / GLYCOLAX) 17 g packet Take 17 g by mouth 2 (two) times daily. 14 each 0   potassium chloride (MICRO-K) 10 MEQ CR capsule Take 2 capsules (20 mEq total) by mouth 2 (two) times daily. *May take one additional tablet as needed for cramping 120 capsule 3   senna (SENOKOT) 8.6 MG TABS tablet Take 2 tablets (17.2 mg total) by mouth 2 (two) times daily with a meal. 120 each 0   SURE COMFORT INS SYR 1CC/28G 28G X 1/2" 1 ML MISC USE TO INJECT INSULIN UP TO 4 TIMES DAILY. 150 each 5   tiZANidine (ZANAFLEX) 4 MG tablet Take 1 tablet (4 mg total) by mouth every 8 (eight) hours as needed for muscle spasms. 30 tablet 0     topiramate (TOPAMAX) 25 MG tablet Take 75 mg by mouth at bedtime.      umeclidinium-vilanterol (ANORO ELLIPTA) 62.5-25 MCG/INH AEPB Inhale 1 puff into the lungs daily. 30 each 0   No current facility-administered medications for this visit.     PHYSICAL EXAMINATION: ECOG PERFORMANCE STATUS: 3 - Symptomatic, >50% confined to bed  No vitals taken today, Exam not performed today   LABORATORY DATA:  I have reviewed the data as listed CBC Latest Ref Rng & Units 11/05/2018 11/03/2018 10/30/2018  WBC 4.0 - 10.5 K/uL 6.3 7.8 5.9  Hemoglobin 12.0 - 15.0 g/dL 9.5(L) 10.5(L) 9.4(L)  Hematocrit 36.0 - 46.0 % 30.9(L) 34.8(L) 29.6(L)  Platelets 150 - 400 K/uL 304 335 283  CMP Latest Ref Rng & Units 11/05/2018 11/03/2018 10/30/2018  Glucose 70 - 99 mg/dL 192(H) 160(H) 337(H)  BUN 8 - 23 mg/dL 43(H) 40(H) 23  Creatinine 0.44 - 1.00 mg/dL 1.80(H) 2.29(H) 1.09(H)  Sodium 135 - 145 mmol/L 141 140 138  Potassium 3.5 - 5.1 mmol/L 4.1 4.7 4.5  Chloride 98 - 111 mmol/L 105 103 102  CO2 22 - 32 mmol/L '24 27 27  ' Calcium 8.9 - 10.3 mg/dL 8.8(L) 9.1 8.6(L)  Total Protein 6.5 - 8.1 g/dL 6.8 7.1 5.7(L)  Total Bilirubin 0.3 - 1.2 mg/dL 0.5 0.5 0.2(L)  Alkaline Phos 38 - 126 U/L 96 115 90  AST 15 - 41 U/L 12(L) 16 14(L)  ALT 0 - 44 U/L '12 16 20      ' RADIOGRAPHIC STUDIES: I have personally reviewed the radiological images as listed and agreed with the findings in the report. No results found.   ASSESSMENT & PLAN:  Stephanie Combs is a 68 y.o. female with   1. Small Cell Lung cancer, LUL, cT3N2M0, stage III, limited stage  -Newly diagnosedin 5/2020during her work up for colon cancer -Ipreviouslydiscussed her lung cancer would take priority in terms of treatment given theaggressivenessof small cell lung cancer, and very high risk of recurrence evenwith curative intentconcurrentchemoradiation.  -The goal of therapy is curative. But unfortunately she hassynchronized colon cancer, which  is incurable because she is not a candidate for surgerydue to hermedical comorbilities -She started first cyclechemoas inpatient on 09/18/18. -Her concurrent chemoRT with cycle 2 chemo was held after day 1 chemo treatment due to worsening Cr, CHF, edema and positive for COVID-19. She was treated for the above in hospital for 3 weeks.  -Lengthy discussion with patient about the prognosis of her small cell lung cancer, due to the delay of her treatment, multiple severe comorbidities, especially CHF and CKD, I had to reduce her chemotherapy dose significantly, radiation is still on hold, her cancer therefore may not be curable. After the lengthy discussion, she agrees to continue current chemo regimen  -She restarted chemo with cycle 3 on 11/03/18 at reduced dose. May complete a total of 5 cycles given dose reduction and delayed radiation treatment.  -Plan to start RT on 11/24/18.  -Labs taken from [facility] on 11/10/18 show CBC and CMP WNL except Hg 9.3, BG 130, BUN 35, Cr 1.65. Will repeat next week.  -Continue withcycle 4 carbo/etopside at reduced dose in 2 weeks with f/u   2. 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  -Unfortunately she is not a candidate for colon cancer surgery due to very high risk ofsurgical mortality.She was seen by surgery during her recent hospitalization.  -I discussed the incurablenature of her colon cancer without surgery. -Willcontinueto monitor on current treatment for her SCLC. -if she recovers from chemoRT for [facility], will consider low intensity chemo such as Xeloda or Irinotecan (which works for [employer] also). Given normal MMR, she is not a candidate for immunotherapy. Given primary right side colon cancer, not much benefit from EGFR inhibitors even if KRAS/NRAS/BRAF wild type.   3. 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  -Continue monitoring  4. CAD, CHF, HTN, significant LE edema -OnLipitor, lasix, nitro -Continue to f/u with cardiologist. -Her CHF and fluid overload has much improved during her recent hospitalization.  -She has been on 103m Lasix in the  morning and 77m Lasix at night since her hospital discharge. She is down 42lbs today since 10/11/18. She responded well to iv lasix  -Due to her worsening renal function, oral Lasix 450mdaily only for now -will follow renal function closely   5.DM,hypothyroidism, morbid obesity -On insulin novolog and long acting levemir. OnLevothyroxine. -Willmonitor glucose closely during her chemo treatment  6.Rheumatoidarthritis, chronic back pain  -On Percocet which she takes 4 times a day.  .-She is also hasGabapentin.  -She use to receiveinjectionfor her back -Pain chronic, stable. She will continue to see her RA and pain specialist.   7. Depression and anxiety -She has been more depressed with recent diagnosis of 2 cancers -She is currently on Celebrex, will continue.  -She notes she still has faith and understands her prognosis. I encouraged her to continue her faith.   8.Goal of care discussion, DNR/DNI -We previously discussed the incurable nature of her cancer, and the overall poor prognosis, especially ifshe does not have good response to chemotherapy or progress on chemo -The patient understands the goal of care is palliative. -I recommend DNR/DNI,she has agreed to DRI/DNR.She has written will and has established her daughter as her POA.   9. Buttock abscess -Developed while in the hospital. Spontaneous drainage on 6/13. General surgery consulted, Dr. CoBrantley Stagevaluated the patient, and performed further bedside incision and drainage. she was treated with iv clindamycin  -I was not able to exam her due to difficulty  position. Per pt, it's very small, and improving. Her daughter changes her dressing daily   10. Acute on chronic renal failure  -Cr was 1.09 on hospital discharge, worse last week, I held her oral lasix and give iv lasix with her chemo last week  -Continue oral lasix 4012maily, she will take more as needed.  -Cr at 1.65 and BUN 35 on 11/11/18  11. Recent COVID-19 -She was incidentally found to be positive for COVID-19 on 10/11/2018, no respiratory symptoms or evidence of pneumonia, she has GI symptoms which could be related -Her testing on 11/04/18 was positive again.   12. Social Support  -She has help from her daughter, friend and neighbor  -She has home aid service at home but given her poor performance status and diagnosis she is requesting more hours for support. I will contact them to discuss her condition.   Plan: -phone visit in a week -Lab, f/u and cycle 4 carbo/etopside on 11/24/18  -Proceed with start of RT on 11/24/18   No problem-specific Assessment & Plan notes found for this encounter.   No orders of the defined types were placed in this encounter.  I discussed the assessment and treatment plan with the patient. The patient was provided an opportunity to ask questions and all were answered. The patient agreed with the plan and demonstrated an understanding of the instructions.  The patient was advised to call back or seek an in-person evaluation if the symptoms worsen or if the condition fails to improve as anticipated.  I provided 15 minutes of non face-to-face telephone visit time during this encounter, and > 50% was spent counseling as documented under my assessment & plan.    YanTruitt MerleD 11/11/2018   I, AmoJoslyn Devonm acting as scribe for YanTruitt MerleD.   I have reviewed the above documentation for accuracy and completeness, and I agree with the above.

## 2018-11-10 ENCOUNTER — Ambulatory Visit: Payer: Medicare Other

## 2018-11-10 ENCOUNTER — Other Ambulatory Visit: Payer: Self-pay

## 2018-11-10 ENCOUNTER — Telehealth: Payer: Self-pay | Admitting: Hematology

## 2018-11-10 ENCOUNTER — Telehealth: Payer: Self-pay

## 2018-11-10 DIAGNOSIS — I5033 Acute on chronic diastolic (congestive) heart failure: Secondary | ICD-10-CM | POA: Diagnosis not present

## 2018-11-10 DIAGNOSIS — I130 Hypertensive heart and chronic kidney disease with heart failure and stage 1 through stage 4 chronic kidney disease, or unspecified chronic kidney disease: Secondary | ICD-10-CM | POA: Diagnosis not present

## 2018-11-10 DIAGNOSIS — E1122 Type 2 diabetes mellitus with diabetic chronic kidney disease: Secondary | ICD-10-CM | POA: Diagnosis not present

## 2018-11-10 DIAGNOSIS — Z7689 Persons encountering health services in other specified circumstances: Secondary | ICD-10-CM | POA: Diagnosis not present

## 2018-11-10 DIAGNOSIS — N183 Chronic kidney disease, stage 3 (moderate): Secondary | ICD-10-CM | POA: Diagnosis not present

## 2018-11-10 DIAGNOSIS — I5031 Acute diastolic (congestive) heart failure: Secondary | ICD-10-CM | POA: Diagnosis not present

## 2018-11-10 DIAGNOSIS — I5022 Chronic systolic (congestive) heart failure: Secondary | ICD-10-CM | POA: Diagnosis not present

## 2018-11-10 DIAGNOSIS — J449 Chronic obstructive pulmonary disease, unspecified: Secondary | ICD-10-CM | POA: Diagnosis not present

## 2018-11-10 NOTE — Patient Outreach (Signed)
[name] Care Management  11/10/2018  Stephanie Combs Mar 17, 1951 [id number]   Brief outreach with Stephanie Combs. Reports waiting for a return call from the clinic nurse. She reports feeling tired and fatigued. Denies complaints of shortness of breath or chest discomfort. Reports using O2 at 2L/min. Reports ambulating with walker as needed. Denies falls.  She confirmed services with [facility] home [facility]. She is receiving therapy and skilled nursing services. Also reports receiving assistance from a nursing aide.   Stephanie Combs denied immediate care management needs. She expressed interest in [facility] services but was not able to complete the assessment at the time of the call. She is agreeable to follow-up outreach. Prefers to be contacted later in the afternoon on next week.  PLAN -Will follow-up next week.   [place] [phone]

## 2018-11-10 NOTE — Telephone Encounter (Signed)
Patient had called regarding Dr. Burr Medico wanting a picture of a wound that had opened up on her abdomen, attempted to call patient back, no answer, no ability to leave a voice message.  Per Dr. Burr Medico unless the area has gotten worse she does not need a picture.

## 2018-11-11 ENCOUNTER — Ambulatory Visit: Payer: Medicare Other

## 2018-11-11 ENCOUNTER — Telehealth: Payer: Self-pay | Admitting: Hematology

## 2018-11-11 ENCOUNTER — Telehealth: Payer: Self-pay

## 2018-11-11 ENCOUNTER — Inpatient Hospital Stay: Payer: Medicare Other | Attending: Hematology | Admitting: Hematology

## 2018-11-11 ENCOUNTER — Encounter: Payer: Self-pay | Admitting: Hematology

## 2018-11-11 DIAGNOSIS — Z794 Long term (current) use of insulin: Secondary | ICD-10-CM | POA: Insufficient documentation

## 2018-11-11 DIAGNOSIS — D508 Other iron deficiency anemias: Secondary | ICD-10-CM

## 2018-11-11 DIAGNOSIS — D3502 Benign neoplasm of left adrenal gland: Secondary | ICD-10-CM | POA: Insufficient documentation

## 2018-11-11 DIAGNOSIS — G8929 Other chronic pain: Secondary | ICD-10-CM | POA: Insufficient documentation

## 2018-11-11 DIAGNOSIS — Z7982 Long term (current) use of aspirin: Secondary | ICD-10-CM | POA: Insufficient documentation

## 2018-11-11 DIAGNOSIS — C183 Malignant neoplasm of hepatic flexure: Secondary | ICD-10-CM | POA: Diagnosis not present

## 2018-11-11 DIAGNOSIS — N183 Chronic kidney disease, stage 3 unspecified: Secondary | ICD-10-CM

## 2018-11-11 DIAGNOSIS — R590 Localized enlarged lymph nodes: Secondary | ICD-10-CM | POA: Insufficient documentation

## 2018-11-11 DIAGNOSIS — D6481 Anemia due to antineoplastic chemotherapy: Secondary | ICD-10-CM | POA: Insufficient documentation

## 2018-11-11 DIAGNOSIS — Z5111 Encounter for antineoplastic chemotherapy: Secondary | ICD-10-CM | POA: Insufficient documentation

## 2018-11-11 DIAGNOSIS — J90 Pleural effusion, not elsewhere classified: Secondary | ICD-10-CM | POA: Insufficient documentation

## 2018-11-11 DIAGNOSIS — F329 Major depressive disorder, single episode, unspecified: Secondary | ICD-10-CM | POA: Insufficient documentation

## 2018-11-11 DIAGNOSIS — Z79899 Other long term (current) drug therapy: Secondary | ICD-10-CM | POA: Insufficient documentation

## 2018-11-11 DIAGNOSIS — N189 Chronic kidney disease, unspecified: Secondary | ICD-10-CM | POA: Insufficient documentation

## 2018-11-11 DIAGNOSIS — M069 Rheumatoid arthritis, unspecified: Secondary | ICD-10-CM | POA: Insufficient documentation

## 2018-11-11 DIAGNOSIS — J449 Chronic obstructive pulmonary disease, unspecified: Secondary | ICD-10-CM | POA: Insufficient documentation

## 2018-11-11 DIAGNOSIS — I5032 Chronic diastolic (congestive) heart failure: Secondary | ICD-10-CM

## 2018-11-11 DIAGNOSIS — E119 Type 2 diabetes mellitus without complications: Secondary | ICD-10-CM

## 2018-11-11 DIAGNOSIS — D61818 Other pancytopenia: Secondary | ICD-10-CM | POA: Insufficient documentation

## 2018-11-11 DIAGNOSIS — I129 Hypertensive chronic kidney disease with stage 1 through stage 4 chronic kidney disease, or unspecified chronic kidney disease: Secondary | ICD-10-CM | POA: Insufficient documentation

## 2018-11-11 DIAGNOSIS — Z8619 Personal history of other infectious and parasitic diseases: Secondary | ICD-10-CM | POA: Insufficient documentation

## 2018-11-11 DIAGNOSIS — C3412 Malignant neoplasm of upper lobe, left bronchus or lung: Secondary | ICD-10-CM | POA: Diagnosis not present

## 2018-11-11 DIAGNOSIS — T451X5A Adverse effect of antineoplastic and immunosuppressive drugs, initial encounter: Secondary | ICD-10-CM | POA: Insufficient documentation

## 2018-11-11 DIAGNOSIS — K449 Diaphragmatic hernia without obstruction or gangrene: Secondary | ICD-10-CM | POA: Insufficient documentation

## 2018-11-11 DIAGNOSIS — M7989 Other specified soft tissue disorders: Secondary | ICD-10-CM | POA: Insufficient documentation

## 2018-11-11 DIAGNOSIS — I2510 Atherosclerotic heart disease of native coronary artery without angina pectoris: Secondary | ICD-10-CM | POA: Insufficient documentation

## 2018-11-11 DIAGNOSIS — I509 Heart failure, unspecified: Secondary | ICD-10-CM | POA: Insufficient documentation

## 2018-11-11 DIAGNOSIS — D509 Iron deficiency anemia, unspecified: Secondary | ICD-10-CM | POA: Insufficient documentation

## 2018-11-11 DIAGNOSIS — E669 Obesity, unspecified: Secondary | ICD-10-CM | POA: Insufficient documentation

## 2018-11-11 DIAGNOSIS — U071 COVID-19: Secondary | ICD-10-CM

## 2018-11-11 DIAGNOSIS — E1122 Type 2 diabetes mellitus with diabetic chronic kidney disease: Secondary | ICD-10-CM | POA: Insufficient documentation

## 2018-11-11 DIAGNOSIS — E039 Hypothyroidism, unspecified: Secondary | ICD-10-CM | POA: Insufficient documentation

## 2018-11-11 DIAGNOSIS — I700 Atherosclerosis of aorta: Secondary | ICD-10-CM | POA: Insufficient documentation

## 2018-11-11 NOTE — Progress Notes (Signed)
Patient gave [place] [facility] to fax over script for bariatric bed.  Call them they no longer carry hospital beds.  Called patient back she requested I send it to [facility].  I faxed script over to them at [phone]

## 2018-11-11 NOTE — Telephone Encounter (Signed)
Spoke with [facility]) patient does not qualify for a bariatric hospital bed, explained this to the patient, she can purchase a bed on her own which is $1200 and will have to pay $120 a month, explained to her that she will need to call [phone] to set this up.  She wrote down the number and verbalized an understanding. I explained she will have to call in herself and get this set up.

## 2018-11-11 NOTE — Telephone Encounter (Signed)
Scheduled appt per 7/2 los. Spoke with patient and she is aware of her appt date and time.

## 2018-11-11 NOTE — Progress Notes (Signed)
[place]   [phone] [phone]   Clinic Follow up Note   Patient Care Team: [name], MD as PCP - General (Internal Medicine) Herminio Commons, MD as PCP - Cardiology (Cardiology) Neldon Labella, RN as [place] Management Truitt Merle, MD as Consulting Physician (Hematology)   I connected with Stephanie Combs on 11/18/2018 at  3:15 PM EDT by telephone visit and verified that I am speaking with the correct person using two identifiers.  I discussed the limitations, risks, security and privacy concerns of performing an evaluation and management service by telephone and the availability of in person appointments. I also discussed with the patient that there may be a patient responsible charge related to this service. The patient expressed understanding and agreed to proceed.   Patient's location:  Her home  Provider's location:  My Office   CHIEF COMPLAINT: F/u ofright colon cancerand Small cell lung cancer  SUMMARY OF ONCOLOGIC HISTORY: Oncology History Overview Note  Cancer Staging Primary cancer of hepatic flexure of colon ([place]) Staging form: Colon and Rectum, AJCC 8th Edition - Clinical stage from 09/09/2018: Stage Unknown (cTX, cN0, cM0) - Signed by Truitt Merle, MD on 09/28/2018  Small cell lung cancer, left upper lobe ([place]) Staging form: Lung, AJCC 8th Edition - Clinical stage from 09/14/2018: Stage IIIB (cT3, cN2, cM0) - Signed by Truitt Merle, MD on 09/28/2018     Primary cancer of hepatic flexure of colon [facility])   Initial Diagnosis   Malignant neoplasm of hepatic flexure ([place])   09/09/2018 Procedure   Coloscopy by Dr Gala Romney 09/09/18  IMPRESSION -Colon mass as described in the vicinity of the hepatic flexure - status post biopsy and inking. Polyps associated with this tumor scattered all the way to the cecum were not removed; multiple splenic flexure and rectal polyps were removed as described above. Left-sided  diverticulosis.  Upper endoscopy By Dr Gala Romney 09/09/18  IMPRESSION -Abnormal distal esophagus suspicious for Barrett's esophagus?biopsied. - Small hiatal hernia. - Erosive gastropathy. Biopsied. -Duodenal bulbar erosions   09/09/2018 Initial Biopsy   Diagnosis 09/09/18 1. Stomach, biopsy - GASTRIC ANTRAL MUCOSA WITH MILD NONSPECIFIC REACTIVE GASTROPATHY - GASTRIC OXYNTIC MUCOSA WITH PARIETAL CELL HYPERPLASIA AS CAN BE SEEN IN HYPERGASTRINEMIC STATES SUCH AS PROPER PATIENT IDENTIFICATION THERAPY. - WARTHIN STARRY STAIN IS NEGATIVE FOR HELICOBACTER PYLORI 2. Esophagus, biopsy - BARRETT'S ESOPHAGUS, NEGATIVE FOR DYSPLASIA 3. Colon, polyp(s), descending - TUBULAR ADENOMA - NEGATIVE FOR HIGH-GRADE DYSPLASIA OR MALIGNANCY 4. Colon, biopsy, hepatic flexure - ADENOCARCINOMA. SEE NOTE 5. Colon, polyp(s), splenic flexure - ADENOCARCINOMA. SEE NOTE - OTHER FRAGMENTS OF TUBULAR ADENOMA(S) WITH ONE SHOWING HIGH-GRADE DYSPLASIA 6. Rectum, polyp(s) - TUBULAR ADENOMA(S) - NEGATIVE FOR HIGH-GRADE DYSPLASIA OR MALIGNANCY   09/09/2018 Cancer Staging   Staging form: Colon and Rectum, AJCC 8th Edition - Clinical stage from 09/09/2018: Stage Unknown (cTX, cN0, cM0) - Signed by Truitt Merle, MD on 09/28/2018   09/11/2018 Imaging   CT CAP 09/11/18  IMPRESSION: 1. Central perihilar left upper lobe 5.4 cm lung mass, new since 01/27/2017 chest CT, compatible with malignancy, favor primary bronchogenic carcinoma. Postobstructive pneumonia/atelectasis in the lingula. 2. Mediastinal adenopathy in the left prevascular/AP window and subcarinal stations, compatible with metastatic nodes (more likely originating from the left upper lobe lung mass). 3. Trace dependent left pleural effusion. 4. Apple-core lesion in the ascending colon, compatible with known primary colonic neoplasm. Adjacent top-normal size right mesenteric lymph node is equivocal for locoregional nodal metastasis. No additional potential findings  of  metastatic disease in the abdomen or pelvis. 5. Chronic findings include: Aortic Atherosclerosis (ICD10-I70.0). Borderline mild cardiomegaly. One vessel coronary atherosclerosis. Dilated main pulmonary artery, suggesting pulmonary arterial hypertension. Left adrenal adenoma. Punctate bilateral nephrolithiasis. Small hiatal hernia.   09/20/2018 Imaging   Brain MRI 09/20/18  IMPRESSION: 1. Motion degraded examination without brain metastases identified. 2. Small skull metastases not excluded. 3. Moderate to severe chronic small vessel ischemic disease.   Small cell lung cancer, left upper lobe ([place])  09/11/2018 Imaging   CT CAP 09/11/18  IMPRESSION: 1. Central perihilar left upper lobe 5.4 cm lung mass, new since 01/27/2017 chest CT, compatible with malignancy, favor primary bronchogenic carcinoma. Postobstructive pneumonia/atelectasis in the lingula. 2. Mediastinal adenopathy in the left prevascular/AP window and subcarinal stations, compatible with metastatic nodes (more likely originating from the left upper lobe lung mass). 3. Trace dependent left pleural effusion. 4. Apple-core lesion in the ascending colon, compatible with known primary colonic neoplasm. Adjacent top-normal size right mesenteric lymph node is equivocal for locoregional nodal metastasis. No additional potential findings of metastatic disease in the abdomen or pelvis. 5. Chronic findings include: Aortic Atherosclerosis (ICD10-I70.0). Borderline mild cardiomegaly. One vessel coronary atherosclerosis. Dilated main pulmonary artery, suggesting pulmonary arterial hypertension. Left adrenal adenoma. Punctate bilateral nephrolithiasis. Small hiatal hernia.    09/14/2018 Initial Biopsy   Diagnosis 5/5.20 Lung, biopsy, LUL - SMALL CELL CARCINOMA.   09/14/2018 Cancer Staging   Staging form: Lung, AJCC 8th Edition - Clinical stage from 09/14/2018: Stage IIIB (cT3, cN2, cM0) - Signed by Truitt Merle, MD on 09/28/2018    09/15/2018 Initial Diagnosis   Small cell lung cancer, left upper lobe ([place])   09/18/2018 -  Chemotherapy   carboplatin and etoposide every 4 weeks for 4 cycles starting 09/18/18. Start concurrent chemoRt on 10/11/18.    09/20/2018 Imaging   Brain MRI 09/20/18  IMPRESSION: 1. Motion degraded examination without brain metastases identified. 2. Small skull metastases not excluded. 3. Moderate to severe chronic small vessel ischemic disease.     Radiation Therapy    RT was postponed to 11/24/18      CURRENT THERAPY:  -Carboplatin and etoposideevery3weeks for 4 cycles starting 09/18/18. Held after C2D1 due to COVID-19(+). She restarted with cycle 3 on 11/03/18.  -RT was postponed to 11/24/18  INTERVAL HISTORY:  Stephanie Combs is here for a follow up. She was able to identify herself by birth date. She notes leg swelling and she continues to bandage them. She feels she is not able to elevate her feet because of her bed. She notes her hospital bed that she received but it is a twin which is too small for her size. She was not able to get bariatric bed, but was not available. She feels her recliner is not working enough for her. She takes '40mg'$  and '20mg'$  lasix. Some days she will do '40mg'$ .   She notes her breathing and chest is doing well. She denies nausea while on daily Zofran. She notes she is eating fairly. She feels she is ready to proceed with treatment and radiation next week. She feels cycle 3 went well.     REVIEW OF SYSTEMS:   Constitutional: Denies fevers, chills or abnormal weight loss Eyes: Denies blurriness of vision Ears, nose, mouth, throat, and face: Denies mucositis or sore throat Respiratory: Denies cough, dyspnea or wheezes Cardiovascular: Denies palpitation, chest discomfort (+) significant lower extremity swelling Gastrointestinal:  Denies nausea, heartburn or change in bowel habits Skin: Denies abnormal skin rashes  Lymphatics: Denies new lymphadenopathy or easy bruising  Neurological:Denies numbness, tingling or new weaknesses Behavioral/Psych: Mood is stable, no new changes  All other systems were reviewed with the patient and are negative.  MEDICAL HISTORY:  Past Medical History:  Diagnosis Date  . Anxiety   . CAD (coronary artery disease)    a. s/p DES to LAD and angioplasty to D1 in 05/2017  . CHF (congestive heart failure) (HCC)    Diastolic  . Chronic back pain   . Chronic pain   . COPD (chronic obstructive pulmonary disease) ([place])   . Degenerative disk disease   . Diabetes mellitus without complication ([place])   . History of medication noncompliance 11/2017   "the patient frequently self-adjusts medications without notifying her physicians"  . Hypertension   . Hypothyroidism   . On home O2    2.5 L N/C prn  . Pedal edema     SURGICAL HISTORY: Past Surgical History:  Procedure Laterality Date  . BIOPSY  09/09/2018   Procedure: BIOPSY;  Surgeon: Daneil Dolin, MD;  [facility];  Service: Endoscopy;;  gastric esophageal hepatic flexure colon  . COLONOSCOPY WITH PROPOFOL N/A 09/09/2018   Procedure: COLONOSCOPY WITH PROPOFOL;  Surgeon: Daneil Dolin, MD;  [facility];  Service: Endoscopy;  Laterality: N/A;  . CORONARY STENT INTERVENTION N/A 05/22/2017   Procedure: CORONARY STENT INTERVENTION;  Surgeon: Martinique, Peter M, MD;  Location: [place] [facility];  Service: Cardiovascular;  Laterality: N/A;  . ESOPHAGOGASTRODUODENOSCOPY (EGD) WITH PROPOFOL N/A 09/09/2018   Procedure: ESOPHAGOGASTRODUODENOSCOPY (EGD) WITH PROPOFOL;  Surgeon: Daneil Dolin, MD;  [facility];  Service: Endoscopy;  Laterality: N/A;  . HERNIA REPAIR    . POLYPECTOMY  09/09/2018   Procedure: POLYPECTOMY;  Surgeon: Daneil Dolin, MD;  [facility];  Service: Endoscopy;;  colon  . RIGHT/LEFT HEART CATH AND CORONARY ANGIOGRAPHY N/A 05/19/2017   Procedure: RIGHT/LEFT HEART CATH AND CORONARY ANGIOGRAPHY;  Surgeon: Leonie Man, MD;  Location: [place] [facility];  Service: Cardiovascular;  Laterality: N/A;  . TUBAL LIGATION    . VIDEO BRONCHOSCOPY WITH ENDOBRONCHIAL ULTRASOUND Left 09/14/2018   Procedure: VIDEO BRONCHOSCOPY WITH ENDOBRONCHIAL ULTRASOUND with biopsies;  Surgeon: Margaretha Seeds, MD;  Location: [place];  Service: Thoracic;  Laterality: Left;    I have reviewed the social history and family history with the patient and they are unchanged from previous note.  ALLERGIES:  is allergic to dilaudid [hydromorphone hcl]; midodrine hcl; actifed cold-allergy [chlorpheniramine-phenylephrine]; doxycycline; other; penicillins; reglan [metoclopramide]; valium [diazepam]; and vistaril [hydroxyzine hcl].  MEDICATIONS:  Current Outpatient Medications  Medication Sig Dispense Refill  . albuterol (PROAIR HFA) 108 (90 Base) MCG/ACT inhaler Inhale 2 puffs into the lungs every 4 (four) hours as needed for wheezing or shortness of breath. For shortness of breath 18 g 0  . albuterol (PROVENTIL) (2.5 MG/3ML) 0.083% nebulizer solution Take 2.5 mg by nebulization 2 (two) times daily as needed for wheezing or shortness of breath.     . allopurinol (ZYLOPRIM) 300 MG tablet Take 1 tablet (300 mg total) by mouth daily for 15 days. 30 tablet 0  . ALPRAZolam (XANAX) 1 MG tablet Take 1 mg by mouth 3 (three) times daily as needed for anxiety or sleep.     Marland Kitchen aspirin EC 81 MG tablet Take 81 mg by mouth daily.    Marland Kitchen atorvastatin (LIPITOR) 80 MG tablet Take 1 tablet (80 mg total) by mouth daily at  6 PM. 30 tablet 0  . cholecalciferol (VITAMIN D3) 25 MCG (1000 UT) tablet Take 5,000 Units by mouth daily.    . furosemide (LASIX) 40 MG tablet Take 60 mg (1 1/2 tabs) in the morning and 40 mg (1 tab) in the afternoon 75 tablet 6  . gabapentin (NEURONTIN) 600 MG tablet Take 600 mg by mouth 3 (three) times daily.     . Hydrocortisone (GERHARDT'S BUTT CREAM) CREA Apply 1 application topically 3 (three) times daily. (Patient taking differently: Apply 1  application topically 4 (four) times daily as needed for irritation. ) 90 each 0  . insulin aspart (NOVOLOG) 100 UNIT/ML injection Inject 1-18 Units into the skin 3 (three) times daily with meals. (Patient taking differently: Inject 0-20 Units into the skin 4 (four) times daily -  before meals and at bedtime. )    . insulin detemir (LEVEMIR) 100 UNIT/ML injection Inject 0.4 mLs (40 Units total) into the skin at bedtime. 10 mL 0  . levothyroxine (SYNTHROID, LEVOTHROID) 200 MCG tablet Take 1 tablet by mouth daily before breakfast.     . nitroGLYCERIN (NITROSTAT) 0.4 MG SL tablet Place 0.4 mg under the tongue every 5 (five) minutes as needed for chest pain.     Marland Kitchen nystatin (MYCOSTATIN/NYSTOP) powder Apply topically 2 (two) times daily. (Patient taking differently: Apply 1 g topically 2 (two) times daily. ) 15 g 0  . Omega-3 Fatty Acids (FISH OIL PO) Take 1 capsule by mouth daily.     . ondansetron (ZOFRAN) 8 MG tablet Take 1 tablet (8 mg total) by mouth every 8 (eight) hours as needed for nausea or vomiting. 120 tablet 0  . oxyCODONE-acetaminophen (PERCOCET) 10-325 MG tablet Take 1 tablet by mouth every 6 (six) hours as needed for pain.    . OXYGEN Inhale 2 L into the lungs continuous.    . pantoprazole (PROTONIX) 40 MG tablet Take 1 tablet (40 mg total) by mouth 2 (two) times daily. 180 tablet 3  . polyethylene glycol (MIRALAX / GLYCOLAX) 17 g packet Take 17 g by mouth 2 (two) times daily. 14 each 0  . potassium chloride (MICRO-K) 10 MEQ CR capsule Take 2 capsules (20 mEq total) by mouth 2 (two) times daily. *May take one additional tablet as needed for cramping 120 capsule 3  . senna (SENOKOT) 8.6 MG TABS tablet Take 2 tablets (17.2 mg total) by mouth 2 (two) times daily with a meal. 120 each 0  . SURE COMFORT INS SYR 1CC/28G 28G X 1/2" 1 ML MISC USE TO INJECT INSULIN UP TO 4 TIMES DAILY. 150 each 5  . tiZANidine (ZANAFLEX) 4 MG tablet Take 1 tablet (4 mg total) by mouth every 8 (eight) hours as needed  for muscle spasms. 30 tablet 0  . topiramate (TOPAMAX) 25 MG tablet Take 75 mg by mouth at bedtime.     Marland Kitchen umeclidinium-vilanterol (ANORO ELLIPTA) 62.5-25 MCG/INH AEPB Inhale 1 puff into the lungs daily. 30 each 0   No current facility-administered medications for this visit.     PHYSICAL EXAMINATION: ECOG PERFORMANCE STATUS: 3 - Symptomatic, >50% confined to bed  No vitals taken today, Exam not performed today   LABORATORY DATA:  I have reviewed the data as listed CBC Latest Ref Rng & Units 11/05/2018 11/03/2018 10/30/2018  WBC 4.0 - 10.5 K/uL 6.3 7.8 5.9  Hemoglobin 12.0 - 15.0 g/dL 9.5(L) 10.5(L) 9.4(L)  Hematocrit 36.0 - 46.0 % 30.9(L) 34.8(L) 29.6(L)  Platelets 150 - 400 K/uL 304 335  283     CMP Latest Ref Rng & Units 11/05/2018 11/03/2018 10/30/2018  Glucose 70 - 99 mg/dL 192(H) 160(H) 337(H)  BUN 8 - 23 mg/dL 43(H) 40(H) 23  Creatinine 0.44 - 1.00 mg/dL 1.80(H) 2.29(H) 1.09(H)  Sodium 135 - 145 mmol/L 141 140 138  Potassium 3.5 - 5.1 mmol/L 4.1 4.7 4.5  Chloride 98 - 111 mmol/L 105 103 102  CO2 22 - 32 mmol/L _0 Calcium 8.9 - 10.3 mg/dL 8.8(L) 9.1 8.6(L)  Total Protein 6.5 - 8.1 g/dL 6.8 7.1 5.7(L)  Total Bilirubin 0.3 - 1.2 mg/dL 0.5 0.5 0.2(L)  Alkaline Phos 38 - 126 U/L 96 115 90  AST 15 - 41 U/L 12(L) 16 14(L)  ALT 0 - 44 U/L _1 RADIOGRAPHIC STUDIES: I have personally reviewed the radiological images as listed and agreed with the findings in the report. No results found.   ASSESSMENT & PLAN:  ALAJAH WITMAN is a 68 y.o. female with   1. Small Cell Lung cancer, LUL, cT3N2M0, stage III, limited stage  -Newly diagnosedin 5/2020during her work up for colon cancer -Ipreviouslydiscussed her lung cancer would take priority in terms of treatment given theaggressivenessof small cell lung cancer, and very high risk of recurrence evenwith curative intentconcurrentchemoradiation.  -The goal of therapy is curative. But unfortunately she  hassynchronized colon cancer, which is incurable because she is not a candidate for surgerydue to hermedical comorbilities -She started first cyclechemoas inpatient on 09/18/18. -Her concurrent chemoRT with cycle 2 chemo washeld after day 1 chemo treatmentdue to worsening Cr, CHF, edema and positive for COVID-19.She was treated for the above in hospital for 3 weeks.  -Lengthy discussion with patient about the prognosis of her small cell lung cancer, due to the delay of her treatment, multiple severe comorbidities, especially CHF and CKD, I had to reduce her chemotherapy dose significantly, radiationisstill on hold, her cancer therefore may not be curable. After the lengthy discussion, she agrees to continue current chemo regimen  -She restarted chemo with cycle 3 on 11/03/18 at reduced dose. She tolerated it much better  -Plan to start RT on 11/24/18 with concurrent cycle 4 chemo.  -She continues to improve, her breathing and chest is adequate, nausea resolved with daily zofran and her eating is fair. Her LE edema persists, continue Lasix.  -Labs taken from [facility] on 11/17/18 show CBC and CMP WNL except WBC 2.7, Hg 8.4, ANC 0.9, BG 139, BUN 31, Cr 1.46, albumin 3.5.  -Continue withcycle 4 carbo/etopside at reduced dose next week with f/u   2. 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  -Unfortunately she is not a candidate for colon cancer surgery due to very high risk ofsurgical mortality.She was seen by surgery during her recent hospitalization.  -I discussed the incurablenature of her colon cancer without surgery. -Willcontinueto monitor on current treatment for her SCLC. -if she recovers from chemoRT for [facility], will consider low intensity chemo such as Xeloda or Irinotecan (which works for [employer] also). Given normal MMR, she is not a candidate for  immunotherapy. Given primary right side colon cancer, not much benefit from EGFR inhibitors even if KRAS/NRAS/BRAF wild type.   3. 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  -Continue monitoring  4. CAD, CHF, HTN, significant LE edema -OnLipitor, lasix, nitro -Continue to  f/u with cardiologist. -Her CHFand fluid overload has muchimproved during her recent hospitalization. -She has been on 27m Lasix in the morning and 225mLasix at night since her hospitaldischarge. She is down 42lbs today since 10/11/18.She responded well to iv lasix  -Due to her worsening renal function, oral Lasix 4053mnd some days with 24m25m well. She will hold Lasix days of infusion as I will give IV fluids with lasix on those days.  -will follow renal function closely    5.DM,hypothyroidism, morbid obesity -On insulin novolog and long acting levemir. OnLevothyroxine. -Willmonitor glucose closely during her chemo treatment  6.Rheumatoidarthritis, chronic back pain  -On Percocet which she takes 4 times a day.  .-She is also hasGabapentin.  -She use to receiveinjectionfor her back -Pain chronic, stable. She will continue to see her RA and pain specialist.   7. Depression and anxiety -She has been more depressed with recent diagnosis of 2 cancers -She is currently on Celebrex, will continue.  -She notes she still has faith and understands her prognosis. I encouraged her to continue her faith.   8.Goal of care discussion, DNR/DNI -We previously discussed the incurable nature of her cancer, and the overall poor prognosis, especially ifshe does not have good response to chemotherapy or progress on chemo -The patient understands the goal of care is palliative. -I recommend DNR/DNI,she has agreed to DRI/DNR.She has written will and has established her daughter as her POA.   9.Buttock abscess -Developed while in the hospital. Spontaneous drainage on 6/13. General surgery consulted, Dr. CornBrantley Stageluated the patient, and performed further bedside incision and drainage. she was treated with iv clindamycin  -I was not able to exam her due to difficulty position. Per pt, it's very small, and improving. Her daughter changes her dressing daily  10. Acute on chronic renal failure  -Cr was 1.09 on hospital discharge, worse last week, I held her oral lasix and give iv lasix with her chemo last week  -Continue oral lasix 40mg36mly, she will take more as needed.  -Cr improved to 1.46 and BUN improved to 31 on 11/17/18 -I strongly encouraged her to drink plenty of water.   11. Recent COVID-19 -She was incidentally found to be positive for COVID-19on 10/11/2018, no respiratory symptoms or evidence of pneumonia,she hasGIsymptoms which could be related -Her testing on 11/04/18 was positive again.   12. Social Support  -She has help from her daughter, friend and neighbor  -She has home aid service at home but given her poor performance status and diagnosis she is requesting more hours for support. I will contact them to discuss her condition.   Plan: -Lab, f/u and cycle 4 carbo/etopside on 11/24/18  -restart of RT on 11/24/18   No problem-specific Assessment & Plan notes found for this encounter.   Orders Placed This Encounter  Procedures  . Sample to Blood Bank    Standing Status:   Future    Standing Expiration Date:   11/18/2019   I discussed the assessment and treatment plan with the patient. The patient was provided an opportunity to ask questions and all were answered. The patient agreed with the plan and demonstrated an understanding of the instructions.  The patient was advised to call back or seek an in-person evaluation if the symptoms worsen or if the condition fails to improve as anticipated.  I provided 15 minutes of non face-to-face telephone  visit time during this encounter, and > 50% was spent counseling as documented under my assessment &  plan.    Truitt Merle, MD 11/18/2018   I, Joslyn Devon, am acting as scribe for Truitt Merle, MD.   I have reviewed the above documentation for accuracy and completeness, and I agree with the above.

## 2018-11-13 DIAGNOSIS — I5031 Acute diastolic (congestive) heart failure: Secondary | ICD-10-CM | POA: Diagnosis not present

## 2018-11-13 DIAGNOSIS — J449 Chronic obstructive pulmonary disease, unspecified: Secondary | ICD-10-CM | POA: Diagnosis not present

## 2018-11-15 ENCOUNTER — Inpatient Hospital Stay: Payer: Medicare Other | Admitting: Nurse Practitioner

## 2018-11-15 ENCOUNTER — Other Ambulatory Visit: Payer: Medicare Other

## 2018-11-15 ENCOUNTER — Inpatient Hospital Stay: Payer: Medicare Other

## 2018-11-15 ENCOUNTER — Ambulatory Visit: Payer: Medicare Other

## 2018-11-15 ENCOUNTER — Ambulatory Visit: Payer: Medicare Other | Admitting: Hematology

## 2018-11-16 ENCOUNTER — Ambulatory Visit: Payer: Medicare Other

## 2018-11-17 ENCOUNTER — Other Ambulatory Visit: Payer: Self-pay

## 2018-11-17 ENCOUNTER — Telehealth: Payer: Self-pay | Admitting: Hematology

## 2018-11-17 ENCOUNTER — Ambulatory Visit: Payer: Medicare Other

## 2018-11-17 DIAGNOSIS — I130 Hypertensive heart and chronic kidney disease with heart failure and stage 1 through stage 4 chronic kidney disease, or unspecified chronic kidney disease: Secondary | ICD-10-CM | POA: Diagnosis not present

## 2018-11-17 DIAGNOSIS — N183 Chronic kidney disease, stage 3 (moderate): Secondary | ICD-10-CM | POA: Diagnosis not present

## 2018-11-17 DIAGNOSIS — C189 Malignant neoplasm of colon, unspecified: Secondary | ICD-10-CM | POA: Diagnosis not present

## 2018-11-17 DIAGNOSIS — C3490 Malignant neoplasm of unspecified part of unspecified bronchus or lung: Secondary | ICD-10-CM | POA: Diagnosis not present

## 2018-11-17 DIAGNOSIS — E1122 Type 2 diabetes mellitus with diabetic chronic kidney disease: Secondary | ICD-10-CM | POA: Diagnosis not present

## 2018-11-17 DIAGNOSIS — I5033 Acute on chronic diastolic (congestive) heart failure: Secondary | ICD-10-CM | POA: Diagnosis not present

## 2018-11-17 DIAGNOSIS — I5022 Chronic systolic (congestive) heart failure: Secondary | ICD-10-CM | POA: Diagnosis not present

## 2018-11-17 NOTE — Patient Outreach (Signed)
[facility]) Care Management  11/17/2018  DIVINE IMBER 1951/01/03 [id number]   Attempted outreach to complete telephonic assessment. Left HIPAA compliant voice message requesting a return call.    PLAN  -Will follow-up within 3-4 business days.   [place] [phone]

## 2018-11-18 ENCOUNTER — Encounter: Payer: Self-pay | Admitting: Hematology

## 2018-11-18 ENCOUNTER — Inpatient Hospital Stay (HOSPITAL_BASED_OUTPATIENT_CLINIC_OR_DEPARTMENT_OTHER): Payer: Medicare Other | Admitting: Hematology

## 2018-11-18 ENCOUNTER — Ambulatory Visit: Payer: Medicare Other

## 2018-11-18 DIAGNOSIS — C3412 Malignant neoplasm of upper lobe, left bronchus or lung: Secondary | ICD-10-CM

## 2018-11-18 DIAGNOSIS — N183 Chronic kidney disease, stage 3 unspecified: Secondary | ICD-10-CM

## 2018-11-18 DIAGNOSIS — Z794 Long term (current) use of insulin: Secondary | ICD-10-CM

## 2018-11-18 DIAGNOSIS — C183 Malignant neoplasm of hepatic flexure: Secondary | ICD-10-CM | POA: Diagnosis not present

## 2018-11-18 DIAGNOSIS — E1122 Type 2 diabetes mellitus with diabetic chronic kidney disease: Secondary | ICD-10-CM

## 2018-11-18 DIAGNOSIS — I2510 Atherosclerotic heart disease of native coronary artery without angina pectoris: Secondary | ICD-10-CM

## 2018-11-18 DIAGNOSIS — I10 Essential (primary) hypertension: Secondary | ICD-10-CM | POA: Diagnosis not present

## 2018-11-18 DIAGNOSIS — N182 Chronic kidney disease, stage 2 (mild): Secondary | ICD-10-CM

## 2018-11-18 DIAGNOSIS — I5032 Chronic diastolic (congestive) heart failure: Secondary | ICD-10-CM

## 2018-11-19 ENCOUNTER — Telehealth: Payer: Self-pay | Admitting: Hematology

## 2018-11-19 ENCOUNTER — Ambulatory Visit: Payer: Medicare Other

## 2018-11-19 DIAGNOSIS — I5022 Chronic systolic (congestive) heart failure: Secondary | ICD-10-CM | POA: Diagnosis not present

## 2018-11-19 DIAGNOSIS — E1122 Type 2 diabetes mellitus with diabetic chronic kidney disease: Secondary | ICD-10-CM | POA: Diagnosis not present

## 2018-11-19 DIAGNOSIS — I130 Hypertensive heart and chronic kidney disease with heart failure and stage 1 through stage 4 chronic kidney disease, or unspecified chronic kidney disease: Secondary | ICD-10-CM | POA: Diagnosis not present

## 2018-11-19 DIAGNOSIS — I5033 Acute on chronic diastolic (congestive) heart failure: Secondary | ICD-10-CM | POA: Diagnosis not present

## 2018-11-19 DIAGNOSIS — N183 Chronic kidney disease, stage 3 (moderate): Secondary | ICD-10-CM | POA: Diagnosis not present

## 2018-11-19 NOTE — Telephone Encounter (Signed)
No los per 7/9.

## 2018-11-22 ENCOUNTER — Ambulatory Visit: Payer: Medicare Other | Admitting: Radiation Oncology

## 2018-11-22 ENCOUNTER — Other Ambulatory Visit: Payer: Self-pay

## 2018-11-22 ENCOUNTER — Ambulatory Visit: Payer: Medicare Other

## 2018-11-22 NOTE — Patient Outreach (Signed)
[facility]) Care Management  11/22/2018  Stephanie Combs July 06, 1950 [id number]    Attempted outreach to complete telephonic assessment. Stephanie Combs reports being very tired and weak today. She denies shortness of breath, chest discomfort or other concerning symptoms. She attributes the fatigue to multiple appointments and cancer treatments.   She denies immediate care management needs. Reports taking medications as prescribed. Denies concerns regarding medication management or affordability. Reports friends and cousins are available if needed. Denies need for additional assistance in the home. Denies need for additional transportation assistance.   Discussed availability for telephonic assessment. She has multiple appointments this week. She is agreeable to outreach on next Monday. Encouraged to call if needed prior to scheduled outreach.  PLAN -Will follow-up on 11/29/18.  [place] [phone]

## 2018-11-22 NOTE — Progress Notes (Signed)
[place]   [phone] [phone]   Clinic Follow up Note   Patient Care Team: [name], MD as PCP - General (Internal Medicine) Herminio Commons, MD as PCP - Cardiology (Cardiology) Neldon Labella, RN as [place] Management Truitt Merle, MD as Consulting Physician (Hematology)  Date of Service:  11/24/2018  CHIEF COMPLAINT: F/u ofright colon cancerand Small cell lung cancer  SUMMARY OF ONCOLOGIC HISTORY: Oncology History Overview Note  Cancer Staging Primary cancer of hepatic flexure of colon [facility]) Staging form: Colon and Rectum, AJCC 8th Edition - Clinical stage from 09/09/2018: Stage Unknown (cTX, cN0, cM0) - Signed by Truitt Merle, MD on 09/28/2018  Small cell lung cancer, left upper lobe ([place]) Staging form: Lung, AJCC 8th Edition - Clinical stage from 09/14/2018: Stage IIIB (cT3, cN2, cM0) - Signed by Truitt Merle, MD on 09/28/2018     Primary cancer of hepatic flexure of colon ([place]) (Resolved)   Initial Diagnosis   Malignant neoplasm of hepatic flexure ([place])   09/09/2018 Procedure   Coloscopy by Dr Gala Romney 09/09/18  IMPRESSION -Colon mass as described in the vicinity of the hepatic flexure - status post biopsy and inking. Polyps associated with this tumor scattered all the way to the cecum were not removed; multiple splenic flexure and rectal polyps were removed as described above. Left-sided diverticulosis.  Upper endoscopy By Dr Gala Romney 09/09/18  IMPRESSION -Abnormal distal esophagus suspicious for Barrett's esophagus?biopsied. - Small hiatal hernia. - Erosive gastropathy. Biopsied. -Duodenal bulbar erosions   09/09/2018 Initial Biopsy   Diagnosis 09/09/18 1. Stomach, biopsy - GASTRIC ANTRAL MUCOSA WITH MILD NONSPECIFIC REACTIVE GASTROPATHY - GASTRIC OXYNTIC MUCOSA WITH PARIETAL CELL HYPERPLASIA AS CAN BE SEEN IN HYPERGASTRINEMIC STATES SUCH AS PROPER PATIENT IDENTIFICATION THERAPY. - WARTHIN STARRY STAIN IS  NEGATIVE FOR HELICOBACTER PYLORI 2. Esophagus, biopsy - BARRETT'S ESOPHAGUS, NEGATIVE FOR DYSPLASIA 3. Colon, polyp(s), descending - TUBULAR ADENOMA - NEGATIVE FOR HIGH-GRADE DYSPLASIA OR MALIGNANCY 4. Colon, biopsy, hepatic flexure - ADENOCARCINOMA. SEE NOTE 5. Colon, polyp(s), splenic flexure - ADENOCARCINOMA. SEE NOTE - OTHER FRAGMENTS OF TUBULAR ADENOMA(S) WITH ONE SHOWING HIGH-GRADE DYSPLASIA 6. Rectum, polyp(s) - TUBULAR ADENOMA(S) - NEGATIVE FOR HIGH-GRADE DYSPLASIA OR MALIGNANCY   09/09/2018 Cancer Staging   Staging form: Colon and Rectum, AJCC 8th Edition - Clinical stage from 09/09/2018: Stage Unknown (cTX, cN0, cM0) - Signed by Truitt Merle, MD on 09/28/2018   09/11/2018 Imaging   CT CAP 09/11/18  IMPRESSION: 1. Central perihilar left upper lobe 5.4 cm lung mass, new since 01/27/2017 chest CT, compatible with malignancy, favor primary bronchogenic carcinoma. Postobstructive pneumonia/atelectasis in the lingula. 2. Mediastinal adenopathy in the left prevascular/AP window and subcarinal stations, compatible with metastatic nodes (more likely originating from the left upper lobe lung mass). 3. Trace dependent left pleural effusion. 4. Apple-core lesion in the ascending colon, compatible with known primary colonic neoplasm. Adjacent top-normal size right mesenteric lymph node is equivocal for locoregional nodal metastasis. No additional potential findings of metastatic disease in the abdomen or pelvis. 5. Chronic findings include: Aortic Atherosclerosis (ICD10-I70.0). Borderline mild cardiomegaly. One vessel coronary atherosclerosis. Dilated main pulmonary artery, suggesting pulmonary arterial hypertension. Left adrenal adenoma. Punctate bilateral nephrolithiasis. Small hiatal hernia.   09/20/2018 Imaging   Brain MRI 09/20/18  IMPRESSION: 1. Motion degraded examination without brain metastases identified. 2. Small skull metastases not excluded. 3. Moderate to severe chronic  small vessel ischemic disease.   Small cell lung cancer, left upper lobe ([place])  09/11/2018 Imaging   CT  CAP 09/11/18  IMPRESSION: 1. Central perihilar left upper lobe 5.4 cm lung mass, new since 01/27/2017 chest CT, compatible with malignancy, favor primary bronchogenic carcinoma. Postobstructive pneumonia/atelectasis in the lingula. 2. Mediastinal adenopathy in the left prevascular/AP window and subcarinal stations, compatible with metastatic nodes (more likely originating from the left upper lobe lung mass). 3. Trace dependent left pleural effusion. 4. Apple-core lesion in the ascending colon, compatible with known primary colonic neoplasm. Adjacent top-normal size right mesenteric lymph node is equivocal for locoregional nodal metastasis. No additional potential findings of metastatic disease in the abdomen or pelvis. 5. Chronic findings include: Aortic Atherosclerosis (ICD10-I70.0). Borderline mild cardiomegaly. One vessel coronary atherosclerosis. Dilated main pulmonary artery, suggesting pulmonary arterial hypertension. Left adrenal adenoma. Punctate bilateral nephrolithiasis. Small hiatal hernia.    09/14/2018 Initial Biopsy   Diagnosis 5/5.20 Lung, biopsy, LUL - SMALL CELL CARCINOMA.   09/14/2018 Cancer Staging   Staging form: Lung, AJCC 8th Edition - Clinical stage from 09/14/2018: Stage IIIB (cT3, cN2, cM0) - Signed by Truitt Merle, MD on 09/28/2018   09/15/2018 Initial Diagnosis   Small cell lung cancer, left upper lobe ([place])   09/18/2018 -  Chemotherapy   carboplatin and etoposide every 4 weeks for 4 cycles starting 09/18/18. Start concurrent chemoRt on 10/11/18.    09/20/2018 Imaging   Brain MRI 09/20/18  IMPRESSION: 1. Motion degraded examination without brain metastases identified. 2. Small skull metastases not excluded. 3. Moderate to severe chronic small vessel ischemic disease.     Radiation Therapy    RT was postponed to 11/24/18      CURRENT THERAPY:  -Carboplatin  and etoposideevery3weeks for 4 cycles starting 09/18/18. Held afterC2D1due to COVID-19(+). She restarted with cycle 3 on 11/03/18. -RT started 11/24/18  INTERVAL HISTORY:  Stephanie Combs is here for a follow up and treatment. She presents to the clinic alone.  She came in with a wheelchair.  She is clinically stable, her gastric discomfort and nausea overall improved, eating and drinking well.  She denies fever, bleeding, or other new symptoms.  She is able to do self care, but spent most time in wheelchair or bed. Her bed sores at buttock have healed well. Leg swelling overall improved. She has lost about 7 lbs from 3 weeks ago.    ROS (+) moderate fatigue, mild dry cough, mild constipation, otherwise negative   MEDICAL HISTORY:  Past Medical History:  Diagnosis Date  . Anxiety   . CAD (coronary artery disease)    a. s/p DES to LAD and angioplasty to D1 in 05/2017  . CHF (congestive heart failure) (H[facility])    Diastolic  . Chronic back pain   . Chronic pain   . COPD (chronic obstructive pulmonary disease) ([place])   . Degenerative disk disease   . Diabetes mellitus without complication ([place])   . History of medication noncompliance 11/2017   "the patient frequently self-adjusts medications without notifying her physicians"  . Hypertension   . Hypothyroidism   . On home O2    2.5 L N/C prn  . Pedal edema     SURGICAL HISTORY: Past Surgical History:  Procedure Laterality Date  . BIOPSY  09/09/2018   Procedure: BIOPSY;  Surgeon: Daneil Dolin, MD;  [facility];  Service: Endoscopy;;  gastric esophageal hepatic flexure colon  . COLONOSCOPY WITH PROPOFOL N/A 09/09/2018   Procedure: COLONOSCOPY WITH PROPOFOL;  Surgeon: Daneil Dolin, MD;  [facility];  Service: Endoscopy;  Laterality: N/A;  . CORONARY  STENT INTERVENTION N/A 05/22/2017   Procedure: CORONARY STENT INTERVENTION;  Surgeon: Martinique, Peter M, MD;  Location: [facility];  Service:  Cardiovascular;  Laterality: N/A;  . ESOPHAGOGASTRODUODENOSCOPY (EGD) WITH PROPOFOL N/A 09/09/2018   Procedure: ESOPHAGOGASTRODUODENOSCOPY (EGD) WITH PROPOFOL;  Surgeon: Daneil Dolin, MD;  [facility];  Service: Endoscopy;  Laterality: N/A;  . HERNIA REPAIR    . POLYPECTOMY  09/09/2018   Procedure: POLYPECTOMY;  Surgeon: Daneil Dolin, MD;  [facility];  Service: Endoscopy;;  colon  . RIGHT/LEFT HEART CATH AND CORONARY ANGIOGRAPHY N/A 05/19/2017   Procedure: RIGHT/LEFT HEART CATH AND CORONARY ANGIOGRAPHY;  Surgeon: Leonie Man, MD;  Location: [facility];  Service: Cardiovascular;  Laterality: N/A;  . TUBAL LIGATION    . VIDEO BRONCHOSCOPY WITH ENDOBRONCHIAL ULTRASOUND Left 09/14/2018   Procedure: VIDEO BRONCHOSCOPY WITH ENDOBRONCHIAL ULTRASOUND with biopsies;  Surgeon: Margaretha Seeds, MD;  Location: [facility];  Service: Thoracic;  Laterality: Left;    I have reviewed the social history and family history with the patient and they are unchanged from previous note.  ALLERGIES:  is allergic to dilaudid [hydromorphone hcl]; midodrine hcl; actifed cold-allergy [chlorpheniramine-phenylephrine]; doxycycline; other; penicillins; reglan [metoclopramide]; valium [diazepam]; and vistaril [hydroxyzine hcl].  MEDICATIONS:  Current Outpatient Medications  Medication Sig Dispense Refill  . albuterol (PROAIR HFA) 108 (90 Base) MCG/ACT inhaler Inhale 2 puffs into the lungs every 4 (four) hours as needed for wheezing or shortness of breath. For shortness of breath 18 g 0  . albuterol (PROVENTIL) (2.5 MG/3ML) 0.083% nebulizer solution Take 2.5 mg by nebulization 2 (two) times daily as needed for wheezing or shortness of breath.     . ALPRAZolam (XANAX) 1 MG tablet Take 1 mg by mouth 3 (three) times daily as needed for anxiety or sleep.     Marland Kitchen aspirin EC 81 MG tablet Take 81 mg by mouth daily.    Marland Kitchen atorvastatin (LIPITOR) 80 MG tablet Take 1 tablet (80 mg total) by mouth daily at  6 PM. 30 tablet 0  . cholecalciferol (VITAMIN D3) 25 MCG (1000 UT) tablet Take 5,000 Units by mouth daily.    . furosemide (LASIX) 40 MG tablet Take 60 mg (1 1/2 tabs) in the morning and 40 mg (1 tab) in the afternoon 75 tablet 6  . gabapentin (NEURONTIN) 600 MG tablet Take 600 mg by mouth 3 (three) times daily.     . Hydrocortisone (GERHARDT'S BUTT CREAM) CREA Apply 1 application topically 3 (three) times daily. (Patient taking differently: Apply 1 application topically 4 (four) times daily as needed for irritation. ) 90 each 0  . insulin aspart (NOVOLOG) 100 UNIT/ML injection Inject 1-18 Units into the skin 3 (three) times daily with meals. (Patient taking differently: Inject 0-20 Units into the skin 4 (four) times daily -  before meals and at bedtime. )    . insulin detemir (LEVEMIR) 100 UNIT/ML injection Inject 0.4 mLs (40 Units total) into the skin at bedtime. 10 mL 0  . levothyroxine (SYNTHROID, LEVOTHROID) 200 MCG tablet Take 1 tablet by mouth daily before breakfast.     . nitroGLYCERIN (NITROSTAT) 0.4 MG SL tablet Place 0.4 mg under the tongue every 5 (five) minutes as needed for chest pain.     Marland Kitchen nystatin (MYCOSTATIN/NYSTOP) powder Apply topically 2 (two) times daily. (Patient taking differently: Apply 1 g topically 2 (two) times daily. ) 15 g 0  . Omega-3 Fatty Acids (FISH OIL PO) Take 1 capsule  by mouth daily.     . ondansetron (ZOFRAN) 8 MG tablet Take 1 tablet (8 mg total) by mouth every 8 (eight) hours as needed for nausea or vomiting. 120 tablet 0  . oxyCODONE-acetaminophen (PERCOCET) 10-325 MG tablet Take 1 tablet by mouth every 6 (six) hours as needed for pain.    . OXYGEN Inhale 2 L into the lungs continuous.    . pantoprazole (PROTONIX) 40 MG tablet Take 1 tablet (40 mg total) by mouth 2 (two) times daily. 180 tablet 3  . polyethylene glycol (MIRALAX / GLYCOLAX) 17 g packet Take 17 g by mouth 2 (two) times daily. 14 each 0  . potassium chloride (MICRO-K) 10 MEQ CR capsule Take 2  capsules (20 mEq total) by mouth 2 (two) times daily. *May take one additional tablet as needed for cramping 120 capsule 3  . senna (SENOKOT) 8.6 MG TABS tablet Take 2 tablets (17.2 mg total) by mouth 2 (two) times daily with a meal. 120 each 0  . SURE COMFORT INS SYR 1CC/28G 28G X 1/2" 1 ML MISC USE TO INJECT INSULIN UP TO 4 TIMES DAILY. 150 each 5  . tiZANidine (ZANAFLEX) 4 MG tablet Take 1 tablet (4 mg total) by mouth every 8 (eight) hours as needed for muscle spasms. 30 tablet 0  . topiramate (TOPAMAX) 25 MG tablet Take 75 mg by mouth at bedtime.     Marland Kitchen umeclidinium-vilanterol (ANORO ELLIPTA) 62.5-25 MCG/INH AEPB Inhale 1 puff into the lungs daily. 30 each 0  . allopurinol (ZYLOPRIM) 300 MG tablet Take 1 tablet (300 mg total) by mouth daily for 15 days. 30 tablet 0   No current facility-administered medications for this visit.     PHYSICAL EXAMINATION: ECOG PERFORMANCE STATUS: 3 - Symptomatic, >50% confined to bed  Vitals:   11/24/18 1230  BP: (!) 141/62  Pulse: 83  Resp: 18  Temp: 97.9 F (36.6 C)  SpO2: 98%   Filed Weights   11/24/18 1230  Weight: 288 lb 8 oz (130.9 kg)    GENERAL:alert, no distress and comfortable, morbid obese lady I wheelchair  SKIN: skin color, texture, turgor are normal, no rashes or significant lesions, except diffuse skin erythema and skin pigmentation on bilateral lower extremity above ankle, slightly better than before  EYES: normal, Conjunctiva are pink and non-injected, sclera clear LUNGS: clear to auscultation and percussion with normal breathing effort HEART: regular rate & rhythm and no murmurs and no lower extremity edema ABDOMEN:abdomen soft, non-tender and normal bowel sounds Musculoskeletal:no cyanosis of digits and no clubbing, (+) b/l leg mild to moderate edema up to knee  NEURO: alert & oriented x 3 with fluent speech, no focal motor/sensory deficits  LABORATORY DATA:  I have reviewed the data as listed CBC Latest Ref Rng & Units  11/24/2018 11/05/2018 11/03/2018  WBC 4.0 - 10.5 K/uL 6.4 6.3 7.8  Hemoglobin 12.0 - 15.0 g/dL 8.5(L) 9.5(L) 10.5(L)  Hematocrit 36.0 - 46.0 % 27.4(L) 30.9(L) 34.8(L)  Platelets 150 - 400 K/uL 334 304 335     CMP Latest Ref Rng & Units 11/24/2018 11/05/2018 11/03/2018  Glucose 70 - 99 mg/dL 174(H) 192(H) 160(H)  BUN 8 - 23 mg/dL 24(H) 43(H) 40(H)  Creatinine 0.44 - 1.00 mg/dL 1.64(H) 1.80(H) 2.29(H)  Sodium 135 - 145 mmol/L 142 141 140  Potassium 3.5 - 5.1 mmol/L 3.9 4.1 4.7  Chloride 98 - 111 mmol/L 106 105 103  CO2 22 - 32 mmol/L _0 Calcium 8.9 - 10.3 mg/dL  8.4(L) 8.8(L) 9.1  Total Protein 6.5 - 8.1 g/dL 6.4(L) 6.8 7.1  Total Bilirubin 0.3 - 1.2 mg/dL 0.3 0.5 0.5  Alkaline Phos 38 - 126 U/L 107 96 115  AST 15 - 41 U/L 14(L) 12(L) 16  ALT 0 - 44 U/L _0 RADIOGRAPHIC STUDIES: I have personally reviewed the radiological images as listed and agreed with the findings in the report. No results found.   ASSESSMENT & PLAN:  Stephanie Combs is a 68 y.o. female with   1. Small Cell Lung cancer, LUL, cT3N2M0, stage III, limited stage  -Newly diagnosedin 5/2020during her work up for colon cancer -Ipreviouslydiscussed her lung cancer would take priority in terms of treatment given theaggressivenessof small cell lung cancer, and very high risk of recurrence evenwith curative intentconcurrentchemoradiation.  -she started first cycle chemo in hospital, second cycle only received day 1 treatment dye to COVID(+), she tolerated cycle 3 with dose reduction very well -Lab reviewed, adequate for treatment, will proceed cycle 4 carboplatin and etoposide today, Carbo dose increased to AUC 3.5, etoposide remain at 49m/m2. She is starting radiation today  -will f/u weekly with lab  -due to chemo dose reduction, will give her cycle 5 chemo with concurrent radiation    2. 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  -Unfortunately she is not a candidate for colon cancer surgery due to very high risk ofsurgical mortality.She was seen by surgery during her recent hospitalization.  -I discussed the incurablenature of her colon cancer without surgery. -Willcontinueto monitor on current treatment for her SCLC. -[facility] will consider low intensity chemo such as Xeloda or Irinotecan (which works for [employer]). Given normal MMR, she is not a candidate for immunotherapy. Given primary right side colon cancer, not much benefit from EGFR inhibitors even if KRAS/NRAS/BRAF wild type.   3. 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  -anemia slightly worse due to chemo  -Continue monitoring  4. CAD, CHF, HTN, significant LE edema, CKD  -OnLipitor, lasix, nitro -Continue to f/u with cardiologist. -Her CHFand fluid overload has muchimproved during her recent hospitalization. -She has been on 412mLasix in the morning.She responded well to iv lasix  -I will hold her oral lasix and give iv lasix 2018muring chemo    5.DM,hypothyroidism, morbid obesity -On insulin novolog and long acting levemir. OnLevothyroxine. -Willmonitor glucose closely during her chemo treatment  6.Rheumatoidarthritis, chronic back pain  -On Percocet which she takes 4 times a day.  -She is also hasGabapentin.  -She use to receiveinjectionfor her back -Pain chronic, stable. She will continue to see her RA and pain specialist.   7. Depression and anxiety -She has been more depressed with recent diagnosis of 2 cancers -She is currently on Celebrex, will continue.  -She notes she still has faith and understands her prognosis. I encouraged her to continue her faith.   8.Goal of care discussion, DNR/DNI -We  previously discussed the incurable nature of her cancer, and the overall poor prognosis, especially ifshe does not have good response to chemotherapy or progress on chemo -The patient understands the goal of care is palliative. -I recommend DNR/DNI,she has agreed to DRI/DNR.She has written will and has established her daughter as her POA.  9.Buttock skin ulcer  -Developed while in the hospital. Spontaneous  drainage on 6/13. General surgery consulted, Dr. Brantley Stage evaluated the patient, and performed further bedside incision and drainage. she was treated with iv clindamycin  -per pt, it has near healed    10. Recent COVID-19 -She was incidentally found to be positive for COVID-19on 10/11/2018, no respiratory symptoms or evidence of pneumonia,she hasGIsymptoms which could be related -Her testing on 11/04/18 was positive again. -no COVID symptoms     Plan: -Lab reviewed, will proceed cycle 4carbo/etopsidetoday, carbo dose slightly increase to AUC 3.5  -restart of RT today -lab and f/u weekly during chemo and RT -next cycle chemo in 3 weeks    No problem-specific Assessment & Plan notes found for this encounter.   No orders of the defined types were placed in this encounter.  All questions were answered. The patient knows to call the clinic with any problems, questions or concerns. No barriers to learning was detected. I spent 20 minutes counseling the patient face to face. The total time spent in the appointment was 25 minutes and more than 50% was on counseling and review of test results     Truitt Merle, MD 11/24/2018   I, Joslyn Devon, am acting as scribe for Truitt Merle, MD.   I have reviewed the above documentation for accuracy and completeness, and I agree with the above.

## 2018-11-23 ENCOUNTER — Ambulatory Visit: Payer: Medicare Other

## 2018-11-23 ENCOUNTER — Telehealth: Payer: Self-pay | Admitting: Internal Medicine

## 2018-11-23 DIAGNOSIS — N183 Chronic kidney disease, stage 3 (moderate): Secondary | ICD-10-CM | POA: Diagnosis not present

## 2018-11-23 DIAGNOSIS — E1122 Type 2 diabetes mellitus with diabetic chronic kidney disease: Secondary | ICD-10-CM | POA: Diagnosis not present

## 2018-11-23 DIAGNOSIS — I5033 Acute on chronic diastolic (congestive) heart failure: Secondary | ICD-10-CM | POA: Diagnosis not present

## 2018-11-23 DIAGNOSIS — I130 Hypertensive heart and chronic kidney disease with heart failure and stage 1 through stage 4 chronic kidney disease, or unspecified chronic kidney disease: Secondary | ICD-10-CM | POA: Diagnosis not present

## 2018-11-23 DIAGNOSIS — I5022 Chronic systolic (congestive) heart failure: Secondary | ICD-10-CM | POA: Diagnosis not present

## 2018-11-23 NOTE — Telephone Encounter (Signed)
Spoke to Pt and she does not need transportation assistance. She has a family member bring her to [place] to and from

## 2018-11-24 ENCOUNTER — Inpatient Hospital Stay: Payer: Medicare Other

## 2018-11-24 ENCOUNTER — Encounter: Payer: Self-pay | Admitting: Hematology

## 2018-11-24 ENCOUNTER — Ambulatory Visit
Admission: RE | Admit: 2018-11-24 | Discharge: 2018-11-24 | Disposition: A | Discharge status: Home / Self Care | Payer: Medicare Other | Source: Ambulatory Visit | Attending: Radiation Oncology | Admitting: Radiation Oncology

## 2018-11-24 ENCOUNTER — Other Ambulatory Visit: Payer: Self-pay

## 2018-11-24 ENCOUNTER — Ambulatory Visit: Payer: Medicare Other

## 2018-11-24 ENCOUNTER — Inpatient Hospital Stay (HOSPITAL_BASED_OUTPATIENT_CLINIC_OR_DEPARTMENT_OTHER): Payer: Medicare Other | Admitting: Hematology

## 2018-11-24 VITALS — BP 141/62 | HR 83 | Temp 97.9°F | Resp 18 | Wt 288.5 lb

## 2018-11-24 DIAGNOSIS — C3412 Malignant neoplasm of upper lobe, left bronchus or lung: Secondary | ICD-10-CM

## 2018-11-24 DIAGNOSIS — N182 Chronic kidney disease, stage 2 (mild): Secondary | ICD-10-CM

## 2018-11-24 DIAGNOSIS — G8929 Other chronic pain: Secondary | ICD-10-CM | POA: Diagnosis not present

## 2018-11-24 DIAGNOSIS — F329 Major depressive disorder, single episode, unspecified: Secondary | ICD-10-CM | POA: Diagnosis not present

## 2018-11-24 DIAGNOSIS — N189 Chronic kidney disease, unspecified: Secondary | ICD-10-CM

## 2018-11-24 DIAGNOSIS — Z794 Long term (current) use of insulin: Secondary | ICD-10-CM

## 2018-11-24 DIAGNOSIS — J90 Pleural effusion, not elsewhere classified: Secondary | ICD-10-CM

## 2018-11-24 DIAGNOSIS — M7989 Other specified soft tissue disorders: Secondary | ICD-10-CM | POA: Diagnosis not present

## 2018-11-24 DIAGNOSIS — E1122 Type 2 diabetes mellitus with diabetic chronic kidney disease: Secondary | ICD-10-CM

## 2018-11-24 DIAGNOSIS — C19 Malignant neoplasm of rectosigmoid junction: Secondary | ICD-10-CM | POA: Insufficient documentation

## 2018-11-24 DIAGNOSIS — I110 Hypertensive heart disease with heart failure: Secondary | ICD-10-CM | POA: Diagnosis not present

## 2018-11-24 DIAGNOSIS — C3490 Malignant neoplasm of unspecified part of unspecified bronchus or lung: Secondary | ICD-10-CM | POA: Diagnosis not present

## 2018-11-24 DIAGNOSIS — T451X5A Adverse effect of antineoplastic and immunosuppressive drugs, initial encounter: Secondary | ICD-10-CM

## 2018-11-24 DIAGNOSIS — E119 Type 2 diabetes mellitus without complications: Secondary | ICD-10-CM | POA: Insufficient documentation

## 2018-11-24 DIAGNOSIS — I129 Hypertensive chronic kidney disease with stage 1 through stage 4 chronic kidney disease, or unspecified chronic kidney disease: Secondary | ICD-10-CM

## 2018-11-24 DIAGNOSIS — C183 Malignant neoplasm of hepatic flexure: Secondary | ICD-10-CM

## 2018-11-24 DIAGNOSIS — Z510 Encounter for antineoplastic radiation therapy: Secondary | ICD-10-CM | POA: Insufficient documentation

## 2018-11-24 DIAGNOSIS — D6481 Anemia due to antineoplastic chemotherapy: Secondary | ICD-10-CM | POA: Diagnosis not present

## 2018-11-24 DIAGNOSIS — I700 Atherosclerosis of aorta: Secondary | ICD-10-CM

## 2018-11-24 DIAGNOSIS — E039 Hypothyroidism, unspecified: Secondary | ICD-10-CM | POA: Insufficient documentation

## 2018-11-24 DIAGNOSIS — I5032 Chronic diastolic (congestive) heart failure: Secondary | ICD-10-CM

## 2018-11-24 DIAGNOSIS — I2510 Atherosclerotic heart disease of native coronary artery without angina pectoris: Secondary | ICD-10-CM

## 2018-11-24 DIAGNOSIS — D509 Iron deficiency anemia, unspecified: Secondary | ICD-10-CM | POA: Insufficient documentation

## 2018-11-24 DIAGNOSIS — I509 Heart failure, unspecified: Secondary | ICD-10-CM | POA: Insufficient documentation

## 2018-11-24 DIAGNOSIS — Z79899 Other long term (current) drug therapy: Secondary | ICD-10-CM

## 2018-11-24 DIAGNOSIS — N183 Chronic kidney disease, stage 3 unspecified: Secondary | ICD-10-CM

## 2018-11-24 DIAGNOSIS — D3502 Benign neoplasm of left adrenal gland: Secondary | ICD-10-CM

## 2018-11-24 DIAGNOSIS — K449 Diaphragmatic hernia without obstruction or gangrene: Secondary | ICD-10-CM | POA: Diagnosis not present

## 2018-11-24 DIAGNOSIS — J449 Chronic obstructive pulmonary disease, unspecified: Secondary | ICD-10-CM

## 2018-11-24 DIAGNOSIS — Z5111 Encounter for antineoplastic chemotherapy: Secondary | ICD-10-CM

## 2018-11-24 DIAGNOSIS — Z7982 Long term (current) use of aspirin: Secondary | ICD-10-CM

## 2018-11-24 DIAGNOSIS — R590 Localized enlarged lymph nodes: Secondary | ICD-10-CM | POA: Diagnosis not present

## 2018-11-24 DIAGNOSIS — E669 Obesity, unspecified: Secondary | ICD-10-CM

## 2018-11-24 DIAGNOSIS — Z8619 Personal history of other infectious and parasitic diseases: Secondary | ICD-10-CM

## 2018-11-24 DIAGNOSIS — M069 Rheumatoid arthritis, unspecified: Secondary | ICD-10-CM

## 2018-11-24 DIAGNOSIS — I10 Essential (primary) hypertension: Secondary | ICD-10-CM

## 2018-11-24 DIAGNOSIS — D61818 Other pancytopenia: Secondary | ICD-10-CM | POA: Diagnosis not present

## 2018-11-24 LAB — CBC WITH DIFFERENTIAL (CANCER CENTER ONLY)
Abs Immature Granulocytes: 0.1 10*3/uL — ABNORMAL HIGH (ref 0.00–0.07)
Basophils Absolute: 0.1 10*3/uL (ref 0.0–0.1)
Basophils Relative: 1 %
Eosinophils Absolute: 0.1 10*3/uL (ref 0.0–0.5)
Eosinophils Relative: 1 %
HCT: 27.4 % — ABNORMAL LOW (ref 36.0–46.0)
Hemoglobin: 8.5 g/dL — ABNORMAL LOW (ref 12.0–15.0)
Immature Granulocytes: 2 %
Lymphocytes Relative: 24 %
Lymphs Abs: 1.5 10*3/uL (ref 0.7–4.0)
MCH: 31.3 pg (ref 26.0–34.0)
MCHC: 31 g/dL (ref 30.0–36.0)
MCV: 100.7 fL — ABNORMAL HIGH (ref 80.0–100.0)
Monocytes Absolute: 0.8 10*3/uL (ref 0.1–1.0)
Monocytes Relative: 12 %
Neutro Abs: 3.9 10*3/uL (ref 1.7–7.7)
Neutrophils Relative %: 60 %
Platelet Count: 334 10*3/uL (ref 150–400)
RBC: 2.72 MIL/uL — ABNORMAL LOW (ref 3.87–5.11)
RDW: 20 % — ABNORMAL HIGH (ref 11.5–15.5)
WBC Count: 6.4 10*3/uL (ref 4.0–10.5)
nRBC: 0 % (ref 0.0–0.2)

## 2018-11-24 LAB — CMP (CANCER CENTER ONLY)
ALT: 6 U/L (ref 0–44)
AST: 14 U/L — ABNORMAL LOW (ref 15–41)
Albumin: 3 g/dL — ABNORMAL LOW (ref 3.5–5.0)
Alkaline Phosphatase: 107 U/L (ref 38–126)
Anion gap: 11 (ref 5–15)
BUN: 24 mg/dL — ABNORMAL HIGH (ref 8–23)
CO2: 25 mmol/L (ref 22–32)
Calcium: 8.4 mg/dL — ABNORMAL LOW (ref 8.9–10.3)
Chloride: 106 mmol/L (ref 98–111)
Creatinine: 1.64 mg/dL — ABNORMAL HIGH (ref 0.44–1.00)
GFR, Est AFR Am: 37 mL/min — ABNORMAL LOW (ref >60)
GFR, Estimated: 32 mL/min — ABNORMAL LOW (ref >60)
Glucose, Bld: 174 mg/dL — ABNORMAL HIGH (ref 70–99)
Potassium: 3.9 mmol/L (ref 3.5–5.1)
Sodium: 142 mmol/L (ref 135–145)
Total Bilirubin: 0.3 mg/dL (ref 0.3–1.2)
Total Protein: 6.4 g/dL — ABNORMAL LOW (ref 6.5–8.1)

## 2018-11-24 LAB — SAMPLE TO BLOOD BANK

## 2018-11-24 MED ORDER — SODIUM CHLORIDE 0.9 % IV SOLN
340.0000 mg | Freq: Once | INTRAVENOUS | Status: AC
  Administered 2018-11-24: 340 mg via INTRAVENOUS
  Filled 2018-11-24: qty 34

## 2018-11-24 MED ORDER — FUROSEMIDE 10 MG/ML IJ SOLN
20.0000 mg | Freq: Once | INTRAMUSCULAR | Status: AC
  Administered 2018-11-24: 20 mg via INTRAVENOUS

## 2018-11-24 MED ORDER — PALONOSETRON HCL INJECTION 0.25 MG/5ML
0.2500 mg | Freq: Once | INTRAVENOUS | Status: AC
  Administered 2018-11-24: 0.25 mg via INTRAVENOUS

## 2018-11-24 MED ORDER — SODIUM CHLORIDE 0.9% FLUSH
10.0000 mL | INTRAVENOUS | Status: DC | PRN
  Filled 2018-11-24: qty 10

## 2018-11-24 MED ORDER — PALONOSETRON HCL INJECTION 0.25 MG/5ML
INTRAVENOUS | Status: AC
  Filled 2018-11-24: qty 5

## 2018-11-24 MED ORDER — PROCHLORPERAZINE MALEATE 10 MG PO TABS: 10.0000 mg | ORAL_TABLET | Freq: Four times a day (QID) | ORAL | 0 refills | Status: DC | PRN

## 2018-11-24 MED ORDER — HEPARIN SOD (PORK) LOCK FLUSH 100 UNIT/ML IV SOLN
500.0000 [IU] | Freq: Once | INTRAVENOUS | Status: DC | PRN
  Filled 2018-11-24: qty 5

## 2018-11-24 MED ORDER — SODIUM CHLORIDE 0.9 % IV SOLN
Freq: Once | INTRAVENOUS | Status: AC
  Administered 2018-11-24: 14:00:00 via INTRAVENOUS
  Filled 2018-11-24: qty 250

## 2018-11-24 MED ORDER — DEXAMETHASONE SODIUM PHOSPHATE 10 MG/ML IJ SOLN
INTRAMUSCULAR | Status: AC
  Filled 2018-11-24: qty 1

## 2018-11-24 MED ORDER — DEXAMETHASONE SODIUM PHOSPHATE 10 MG/ML IJ SOLN
10.0000 mg | Freq: Once | INTRAMUSCULAR | Status: AC
  Administered 2018-11-24: 10 mg via INTRAVENOUS

## 2018-11-24 MED ORDER — FUROSEMIDE 10 MG/ML IJ SOLN
INTRAMUSCULAR | Status: AC
  Filled 2018-11-24: qty 2

## 2018-11-24 MED ORDER — SODIUM CHLORIDE 0.9 % IV SOLN
75.0000 mg/m2 | Freq: Once | INTRAVENOUS | Status: AC
  Administered 2018-11-24: 190 mg via INTRAVENOUS
  Filled 2018-11-24: qty 9.5

## 2018-11-24 NOTE — Progress Notes (Signed)
.  comp

## 2018-11-24 NOTE — Progress Notes (Signed)
Reviewed pt labs with Dr. Burr Medico and pt ok to treat with Creatinine 1.6    1655: Per Dr. Burr Medico ok to leave 24 gauge PIV in place overnight for treatment 11/25/2018. PIV flushed without difficulty and pt aware to keep PIV dry and clean. Pt verbalized understanding and had no further questions.

## 2018-11-24 NOTE — Progress Notes (Signed)
Spoke w/ Dr. Burr Medico - plan for carboplatin today is to increase AUC to 3.5 using today's creatinine (received AUC = 3 from last cycle). She tolerated last cycle well and her creatinine has improved since then.   Demetrius Charity, PharmD, [facility] Pharmacist [facility]acy Phone: [phone] 11/24/2018

## 2018-11-24 NOTE — Patient Instructions (Signed)
[facility] Discharge Instructions for Patients Receiving Chemotherapy  Today you received the following chemotherapy agents Carboplatin and Etoposide.  To help prevent nausea and vomiting after your treatment, we encourage you to take your nausea medication as directed BUT NO ZOFRAN FOR 3 DAYS AFTER CHEMO.   If you develop nausea and vomiting that is not controlled by your nausea medication, call the clinic.   BELOW ARE SYMPTOMS THAT SHOULD BE REPORTED IMMEDIATELY:  *FEVER GREATER THAN 100.5 F  *CHILLS WITH OR WITHOUT FEVER  NAUSEA AND VOMITING THAT IS NOT CONTROLLED WITH YOUR NAUSEA MEDICATION  *UNUSUAL SHORTNESS OF BREATH  *UNUSUAL BRUISING OR BLEEDING  TENDERNESS IN MOUTH AND THROAT WITH OR WITHOUT PRESENCE OF ULCERS  *URINARY PROBLEMS  *BOWEL PROBLEMS  UNUSUAL RASH Items with * indicate a potential emergency and should be followed up as soon as possible.  Feel free to call the clinic you have any questions or concerns. The clinic phone number is [phone].  Please show the [facility] at check-in to the Emergency Department and triage nurse.

## 2018-11-25 ENCOUNTER — Ambulatory Visit: Payer: Medicare Other

## 2018-11-25 ENCOUNTER — Other Ambulatory Visit: Payer: Self-pay

## 2018-11-25 ENCOUNTER — Inpatient Hospital Stay: Payer: Medicare Other

## 2018-11-25 ENCOUNTER — Telehealth: Payer: Self-pay | Admitting: Hematology

## 2018-11-25 ENCOUNTER — Ambulatory Visit
Admission: RE | Admit: 2018-11-25 | Discharge: 2018-11-25 | Disposition: A | Discharge status: Home / Self Care | Payer: Medicare Other | Source: Ambulatory Visit | Attending: Radiation Oncology | Admitting: Radiation Oncology

## 2018-11-25 VITALS — BP 118/54 | HR 70 | Temp 97.8°F | Resp 16

## 2018-11-25 DIAGNOSIS — C3412 Malignant neoplasm of upper lobe, left bronchus or lung: Secondary | ICD-10-CM

## 2018-11-25 DIAGNOSIS — I700 Atherosclerosis of aorta: Secondary | ICD-10-CM | POA: Diagnosis not present

## 2018-11-25 DIAGNOSIS — D61818 Other pancytopenia: Secondary | ICD-10-CM | POA: Diagnosis not present

## 2018-11-25 DIAGNOSIS — I509 Heart failure, unspecified: Secondary | ICD-10-CM | POA: Diagnosis not present

## 2018-11-25 DIAGNOSIS — N189 Chronic kidney disease, unspecified: Secondary | ICD-10-CM | POA: Diagnosis not present

## 2018-11-25 DIAGNOSIS — Z5111 Encounter for antineoplastic chemotherapy: Secondary | ICD-10-CM | POA: Diagnosis not present

## 2018-11-25 DIAGNOSIS — I129 Hypertensive chronic kidney disease with stage 1 through stage 4 chronic kidney disease, or unspecified chronic kidney disease: Secondary | ICD-10-CM | POA: Diagnosis not present

## 2018-11-25 DIAGNOSIS — E039 Hypothyroidism, unspecified: Secondary | ICD-10-CM | POA: Diagnosis not present

## 2018-11-25 DIAGNOSIS — I2510 Atherosclerotic heart disease of native coronary artery without angina pectoris: Secondary | ICD-10-CM | POA: Diagnosis not present

## 2018-11-25 DIAGNOSIS — I110 Hypertensive heart disease with heart failure: Secondary | ICD-10-CM | POA: Diagnosis not present

## 2018-11-25 DIAGNOSIS — E1122 Type 2 diabetes mellitus with diabetic chronic kidney disease: Secondary | ICD-10-CM | POA: Diagnosis not present

## 2018-11-25 DIAGNOSIS — G8929 Other chronic pain: Secondary | ICD-10-CM | POA: Diagnosis not present

## 2018-11-25 DIAGNOSIS — T451X5A Adverse effect of antineoplastic and immunosuppressive drugs, initial encounter: Secondary | ICD-10-CM | POA: Diagnosis not present

## 2018-11-25 DIAGNOSIS — R590 Localized enlarged lymph nodes: Secondary | ICD-10-CM | POA: Diagnosis not present

## 2018-11-25 DIAGNOSIS — J90 Pleural effusion, not elsewhere classified: Secondary | ICD-10-CM | POA: Diagnosis not present

## 2018-11-25 DIAGNOSIS — D6481 Anemia due to antineoplastic chemotherapy: Secondary | ICD-10-CM | POA: Diagnosis not present

## 2018-11-25 DIAGNOSIS — M069 Rheumatoid arthritis, unspecified: Secondary | ICD-10-CM | POA: Diagnosis not present

## 2018-11-25 DIAGNOSIS — C19 Malignant neoplasm of rectosigmoid junction: Secondary | ICD-10-CM | POA: Diagnosis not present

## 2018-11-25 DIAGNOSIS — Z510 Encounter for antineoplastic radiation therapy: Secondary | ICD-10-CM | POA: Diagnosis not present

## 2018-11-25 DIAGNOSIS — E119 Type 2 diabetes mellitus without complications: Secondary | ICD-10-CM | POA: Diagnosis not present

## 2018-11-25 DIAGNOSIS — C183 Malignant neoplasm of hepatic flexure: Secondary | ICD-10-CM | POA: Diagnosis not present

## 2018-11-25 DIAGNOSIS — D509 Iron deficiency anemia, unspecified: Secondary | ICD-10-CM | POA: Diagnosis not present

## 2018-11-25 DIAGNOSIS — C3490 Malignant neoplasm of unspecified part of unspecified bronchus or lung: Secondary | ICD-10-CM | POA: Diagnosis not present

## 2018-11-25 DIAGNOSIS — K449 Diaphragmatic hernia without obstruction or gangrene: Secondary | ICD-10-CM | POA: Diagnosis not present

## 2018-11-25 DIAGNOSIS — D3502 Benign neoplasm of left adrenal gland: Secondary | ICD-10-CM | POA: Diagnosis not present

## 2018-11-25 DIAGNOSIS — Z794 Long term (current) use of insulin: Secondary | ICD-10-CM | POA: Diagnosis not present

## 2018-11-25 DIAGNOSIS — M7989 Other specified soft tissue disorders: Secondary | ICD-10-CM | POA: Diagnosis not present

## 2018-11-25 MED ORDER — DEXAMETHASONE SODIUM PHOSPHATE 10 MG/ML IJ SOLN
10.0000 mg | Freq: Once | INTRAMUSCULAR | Status: AC
  Administered 2018-11-25: 10 mg via INTRAVENOUS

## 2018-11-25 MED ORDER — SODIUM CHLORIDE 0.9 % IV SOLN
75.0000 mg/m2 | Freq: Once | INTRAVENOUS | Status: AC
  Administered 2018-11-25: 190 mg via INTRAVENOUS
  Filled 2018-11-25: qty 9.5

## 2018-11-25 MED ORDER — SODIUM CHLORIDE 0.9 % IV SOLN
Freq: Once | INTRAVENOUS | Status: AC
  Administered 2018-11-25: 13:00:00 via INTRAVENOUS
  Filled 2018-11-25: qty 250

## 2018-11-25 MED ORDER — FUROSEMIDE 10 MG/ML IJ SOLN
INTRAMUSCULAR | Status: AC
  Filled 2018-11-25: qty 2

## 2018-11-25 MED ORDER — FUROSEMIDE 10 MG/ML IJ SOLN
20.0000 mg | Freq: Once | INTRAMUSCULAR | Status: AC
  Administered 2018-11-25: 20 mg via INTRAVENOUS

## 2018-11-25 MED ORDER — DEXAMETHASONE SODIUM PHOSPHATE 10 MG/ML IJ SOLN
INTRAMUSCULAR | Status: AC
  Filled 2018-11-25: qty 1

## 2018-11-25 NOTE — Telephone Encounter (Signed)
Scheduled appt per 7/15 los.  Left a voice message of scheduled appt date and time.

## 2018-11-25 NOTE — Patient Instructions (Signed)
[facility] Discharge Instructions for Patients Receiving Chemotherapy  Today you received the following chemotherapy agents:  Etoposide.  To help prevent nausea and vomiting after your treatment, we encourage you to take your nausea medication as directed.   If you develop nausea and vomiting that is not controlled by your nausea medication, call the clinic.   BELOW ARE SYMPTOMS THAT SHOULD BE REPORTED IMMEDIATELY:  *FEVER GREATER THAN 100.5 F  *CHILLS WITH OR WITHOUT FEVER  NAUSEA AND VOMITING THAT IS NOT CONTROLLED WITH YOUR NAUSEA MEDICATION  *UNUSUAL SHORTNESS OF BREATH  *UNUSUAL BRUISING OR BLEEDING  TENDERNESS IN MOUTH AND THROAT WITH OR WITHOUT PRESENCE OF ULCERS  *URINARY PROBLEMS  *BOWEL PROBLEMS  UNUSUAL RASH Items with * indicate a potential emergency and should be followed up as soon as possible.  Feel free to call the clinic should you have any questions or concerns. The clinic phone number is [phone].  Please show the [facility] at check-in to the Emergency Department and triage nurse.

## 2018-11-26 ENCOUNTER — Ambulatory Visit: Payer: Medicare Other

## 2018-11-26 ENCOUNTER — Ambulatory Visit
Admission: RE | Admit: 2018-11-26 | Discharge: 2018-11-26 | Disposition: A | Discharge status: Home / Self Care | Payer: Medicare Other | Source: Ambulatory Visit | Attending: Radiation Oncology | Admitting: Radiation Oncology

## 2018-11-26 ENCOUNTER — Other Ambulatory Visit: Payer: Self-pay | Admitting: Medical

## 2018-11-26 ENCOUNTER — Inpatient Hospital Stay: Payer: Medicare Other

## 2018-11-26 ENCOUNTER — Other Ambulatory Visit: Payer: Self-pay

## 2018-11-26 VITALS — BP 126/62 | HR 81 | Temp 97.7°F | Resp 20

## 2018-11-26 DIAGNOSIS — Z510 Encounter for antineoplastic radiation therapy: Secondary | ICD-10-CM | POA: Diagnosis not present

## 2018-11-26 DIAGNOSIS — M069 Rheumatoid arthritis, unspecified: Secondary | ICD-10-CM | POA: Diagnosis not present

## 2018-11-26 DIAGNOSIS — I110 Hypertensive heart disease with heart failure: Secondary | ICD-10-CM | POA: Diagnosis not present

## 2018-11-26 DIAGNOSIS — Z794 Long term (current) use of insulin: Secondary | ICD-10-CM | POA: Diagnosis not present

## 2018-11-26 DIAGNOSIS — C3412 Malignant neoplasm of upper lobe, left bronchus or lung: Secondary | ICD-10-CM

## 2018-11-26 DIAGNOSIS — E119 Type 2 diabetes mellitus without complications: Secondary | ICD-10-CM | POA: Diagnosis not present

## 2018-11-26 DIAGNOSIS — D6481 Anemia due to antineoplastic chemotherapy: Secondary | ICD-10-CM | POA: Diagnosis not present

## 2018-11-26 DIAGNOSIS — I129 Hypertensive chronic kidney disease with stage 1 through stage 4 chronic kidney disease, or unspecified chronic kidney disease: Secondary | ICD-10-CM | POA: Diagnosis not present

## 2018-11-26 DIAGNOSIS — D3502 Benign neoplasm of left adrenal gland: Secondary | ICD-10-CM | POA: Diagnosis not present

## 2018-11-26 DIAGNOSIS — J90 Pleural effusion, not elsewhere classified: Secondary | ICD-10-CM | POA: Diagnosis not present

## 2018-11-26 DIAGNOSIS — C183 Malignant neoplasm of hepatic flexure: Secondary | ICD-10-CM | POA: Diagnosis not present

## 2018-11-26 DIAGNOSIS — C19 Malignant neoplasm of rectosigmoid junction: Secondary | ICD-10-CM | POA: Diagnosis not present

## 2018-11-26 DIAGNOSIS — I2510 Atherosclerotic heart disease of native coronary artery without angina pectoris: Secondary | ICD-10-CM | POA: Diagnosis not present

## 2018-11-26 DIAGNOSIS — K449 Diaphragmatic hernia without obstruction or gangrene: Secondary | ICD-10-CM | POA: Diagnosis not present

## 2018-11-26 DIAGNOSIS — C3490 Malignant neoplasm of unspecified part of unspecified bronchus or lung: Secondary | ICD-10-CM | POA: Diagnosis not present

## 2018-11-26 DIAGNOSIS — G8929 Other chronic pain: Secondary | ICD-10-CM | POA: Diagnosis not present

## 2018-11-26 DIAGNOSIS — T451X5A Adverse effect of antineoplastic and immunosuppressive drugs, initial encounter: Secondary | ICD-10-CM | POA: Diagnosis not present

## 2018-11-26 DIAGNOSIS — E039 Hypothyroidism, unspecified: Secondary | ICD-10-CM | POA: Diagnosis not present

## 2018-11-26 DIAGNOSIS — R590 Localized enlarged lymph nodes: Secondary | ICD-10-CM | POA: Diagnosis not present

## 2018-11-26 DIAGNOSIS — E1122 Type 2 diabetes mellitus with diabetic chronic kidney disease: Secondary | ICD-10-CM | POA: Diagnosis not present

## 2018-11-26 DIAGNOSIS — M7989 Other specified soft tissue disorders: Secondary | ICD-10-CM | POA: Diagnosis not present

## 2018-11-26 DIAGNOSIS — N189 Chronic kidney disease, unspecified: Secondary | ICD-10-CM | POA: Diagnosis not present

## 2018-11-26 DIAGNOSIS — I509 Heart failure, unspecified: Secondary | ICD-10-CM | POA: Diagnosis not present

## 2018-11-26 DIAGNOSIS — Z5111 Encounter for antineoplastic chemotherapy: Secondary | ICD-10-CM | POA: Diagnosis not present

## 2018-11-26 DIAGNOSIS — D61818 Other pancytopenia: Secondary | ICD-10-CM | POA: Diagnosis not present

## 2018-11-26 DIAGNOSIS — S31109A Unspecified open wound of abdominal wall, unspecified quadrant without penetration into peritoneal cavity, initial encounter: Secondary | ICD-10-CM | POA: Diagnosis not present

## 2018-11-26 DIAGNOSIS — I700 Atherosclerosis of aorta: Secondary | ICD-10-CM | POA: Diagnosis not present

## 2018-11-26 DIAGNOSIS — D509 Iron deficiency anemia, unspecified: Secondary | ICD-10-CM | POA: Diagnosis not present

## 2018-11-26 MED ORDER — SONAFINE EX EMUL
1.0000 "application " | Freq: Once | CUTANEOUS | Status: AC
  Administered 2018-11-26: 1 via TOPICAL

## 2018-11-26 MED ORDER — FUROSEMIDE 10 MG/ML IJ SOLN
20.0000 mg | Freq: Once | INTRAMUSCULAR | Status: AC
  Administered 2018-11-26: 20 mg via INTRAVENOUS

## 2018-11-26 MED ORDER — FUROSEMIDE 10 MG/ML IJ SOLN
INTRAMUSCULAR | Status: AC
  Filled 2018-11-26: qty 2

## 2018-11-26 MED ORDER — SODIUM CHLORIDE 0.9 % IV SOLN
Freq: Once | INTRAVENOUS | Status: AC
  Administered 2018-11-26: 15:00:00 via INTRAVENOUS
  Filled 2018-11-26: qty 250

## 2018-11-26 MED ORDER — DEXAMETHASONE SODIUM PHOSPHATE 10 MG/ML IJ SOLN
INTRAMUSCULAR | Status: AC
  Filled 2018-11-26: qty 1

## 2018-11-26 MED ORDER — DEXAMETHASONE SODIUM PHOSPHATE 10 MG/ML IJ SOLN
10.0000 mg | Freq: Once | INTRAMUSCULAR | Status: AC
  Administered 2018-11-26: 10 mg via INTRAVENOUS

## 2018-11-26 MED ORDER — PROMETHAZINE HCL 12.5 MG PO TABS: 12.5000 mg | ORAL_TABLET | Freq: Four times a day (QID) | ORAL | 1 refills | Status: DC | PRN

## 2018-11-26 MED ORDER — SODIUM CHLORIDE 0.9 % IV SOLN
75.0000 mg/m2 | Freq: Once | INTRAVENOUS | Status: AC
  Administered 2018-11-26: 190 mg via INTRAVENOUS
  Filled 2018-11-26: qty 9.5

## 2018-11-26 NOTE — Progress Notes (Signed)
Per Dr. Burr Medico okay to treat with creatinine 1.64 from labs on 11/24/2018

## 2018-11-26 NOTE — Progress Notes (Signed)
[place]   [phone] [phone]   Clinic Follow up Note   Patient Care Team: [name], MD as PCP - General (Internal Medicine) Herminio Commons, MD as PCP - Cardiology (Cardiology) Neldon Labella, RN as [facility] Truitt Merle, MD as Consulting Physician (Hematology)  Date of Service:  12/01/2018  CHIEF COMPLAINT: F/u ofright colon cancerand Small cell lung cancer  SUMMARY OF ONCOLOGIC HISTORY: Oncology History Overview Note  Cancer Staging Primary cancer of hepatic flexure of colon [facility]) Staging form: Colon and Rectum, AJCC 8th Edition - Clinical stage from 09/09/2018: Stage Unknown (cTX, cN0, cM0) - Signed by Truitt Merle, MD on 09/28/2018  Small cell lung cancer, left upper lobe ([place]) Staging form: Lung, AJCC 8th Edition - Clinical stage from 09/14/2018: Stage IIIB (cT3, cN2, cM0) - Signed by Truitt Merle, MD on 09/28/2018     Primary cancer of hepatic flexure of colon ([place]) (Resolved)   Initial Diagnosis   Malignant neoplasm of hepatic flexure ([place])   09/09/2018 Procedure   Coloscopy by Dr Gala Romney 09/09/18  IMPRESSION -Colon mass as described in the vicinity of the hepatic flexure - status post biopsy and inking. Polyps associated with this tumor scattered all the way to the cecum were not removed; multiple splenic flexure and rectal polyps were removed as described above. Left-sided diverticulosis.  Upper endoscopy By Dr Gala Romney 09/09/18  IMPRESSION -Abnormal distal esophagus suspicious for Barrett's esophagus?biopsied. - Small hiatal hernia. - Erosive gastropathy. Biopsied. -Duodenal bulbar erosions   09/09/2018 Initial Biopsy   Diagnosis 09/09/18 1. Stomach, biopsy - GASTRIC ANTRAL MUCOSA WITH MILD NONSPECIFIC REACTIVE GASTROPATHY - GASTRIC OXYNTIC MUCOSA WITH PARIETAL CELL HYPERPLASIA AS CAN BE SEEN IN HYPERGASTRINEMIC STATES SUCH AS PROPER PATIENT IDENTIFICATION THERAPY. - WARTHIN STARRY STAIN IS  NEGATIVE FOR HELICOBACTER PYLORI 2. Esophagus, biopsy - BARRETT'S ESOPHAGUS, NEGATIVE FOR DYSPLASIA 3. Colon, polyp(s), descending - TUBULAR ADENOMA - NEGATIVE FOR HIGH-GRADE DYSPLASIA OR MALIGNANCY 4. Colon, biopsy, hepatic flexure - ADENOCARCINOMA. SEE NOTE 5. Colon, polyp(s), splenic flexure - ADENOCARCINOMA. SEE NOTE - OTHER FRAGMENTS OF TUBULAR ADENOMA(S) WITH ONE SHOWING HIGH-GRADE DYSPLASIA 6. Rectum, polyp(s) - TUBULAR ADENOMA(S) - NEGATIVE FOR HIGH-GRADE DYSPLASIA OR MALIGNANCY   09/09/2018 Cancer Staging   Staging form: Colon and Rectum, AJCC 8th Edition - Clinical stage from 09/09/2018: Stage Unknown (cTX, cN0, cM0) - Signed by Truitt Merle, MD on 09/28/2018   09/11/2018 Imaging   CT CAP 09/11/18  IMPRESSION: 1. Central perihilar left upper lobe 5.4 cm lung mass, new since 01/27/2017 chest CT, compatible with malignancy, favor primary bronchogenic carcinoma. Postobstructive pneumonia/atelectasis in the lingula. 2. Mediastinal adenopathy in the left prevascular/AP window and subcarinal stations, compatible with metastatic nodes (more likely originating from the left upper lobe lung mass). 3. Trace dependent left pleural effusion. 4. Apple-core lesion in the ascending colon, compatible with known primary colonic neoplasm. Adjacent top-normal size right mesenteric lymph node is equivocal for locoregional nodal metastasis. No additional potential findings of metastatic disease in the abdomen or pelvis. 5. Chronic findings include: Aortic Atherosclerosis (ICD10-I70.0). Borderline mild cardiomegaly. One vessel coronary atherosclerosis. Dilated main pulmonary artery, suggesting pulmonary arterial hypertension. Left adrenal adenoma. Punctate bilateral nephrolithiasis. Small hiatal hernia.   09/20/2018 Imaging   Brain MRI 09/20/18  IMPRESSION: 1. Motion degraded examination without brain metastases identified. 2. Small skull metastases not excluded. 3. Moderate to severe chronic  small vessel ischemic disease.   Small cell lung cancer, left upper lobe ([place])  09/11/2018 Imaging   CT  CAP 09/11/18  IMPRESSION: 1. Central perihilar left upper lobe 5.4 cm lung mass, new since 01/27/2017 chest CT, compatible with malignancy, favor primary bronchogenic carcinoma. Postobstructive pneumonia/atelectasis in the lingula. 2. Mediastinal adenopathy in the left prevascular/AP window and subcarinal stations, compatible with metastatic nodes (more likely originating from the left upper lobe lung mass). 3. Trace dependent left pleural effusion. 4. Apple-core lesion in the ascending colon, compatible with known primary colonic neoplasm. Adjacent top-normal size right mesenteric lymph node is equivocal for locoregional nodal metastasis. No additional potential findings of metastatic disease in the abdomen or pelvis. 5. Chronic findings include: Aortic Atherosclerosis (ICD10-I70.0). Borderline mild cardiomegaly. One vessel coronary atherosclerosis. Dilated main pulmonary artery, suggesting pulmonary arterial hypertension. Left adrenal adenoma. Punctate bilateral nephrolithiasis. Small hiatal hernia.    09/14/2018 Initial Biopsy   Diagnosis 5/5.20 Lung, biopsy, LUL - SMALL CELL CARCINOMA.   09/14/2018 Cancer Staging   Staging form: Lung, AJCC 8th Edition - Clinical stage from 09/14/2018: Stage IIIB (cT3, cN2, cM0) - Signed by Truitt Merle, MD on 09/28/2018   09/15/2018 Initial Diagnosis   Small cell lung cancer, left upper lobe ([place])   09/18/2018 -  Chemotherapy   carboplatin and etoposide every 4 weeks for 4 cycles starting 09/18/18. Start concurrent chemoRt on 10/11/18.    09/20/2018 Imaging   Brain MRI 09/20/18  IMPRESSION: 1. Motion degraded examination without brain metastases identified. 2. Small skull metastases not excluded. 3. Moderate to severe chronic small vessel ischemic disease.     Radiation Therapy    RT was postponed to 11/24/18      CURRENT THERAPY:  -Carboplatin  and etoposideevery3weeks for 4 cycles starting 09/18/18. Held afterC2D1due to COVID-19(+). She restarted with cycle3on 11/03/18. -RT started 11/24/18  INTERVAL HISTORY:  CLAIRISSA VALVANO is here for a follow up. She presents to the clinic alone. She tolerated chemotherapy well last week, had a mild gastric discomfort, a few episodes of diarrhea, resolved.  She is tolerating radiation well.  Her leg swelling is stable, she has lost about 2 pounds in the past week, no worsening dyspnea.  No other new complaints.    REVIEW OF SYSTEMS:   Constitutional: Denies fevers, chills or abnormal weight loss, (+) fatigue  Eyes: Denies blurriness of vision Ears, nose, mouth, throat, and face: Denies mucositis or sore throat Respiratory: Denies cough, mild dyspnea, no wheezes Cardiovascular: Denies palpitation, chest discomfort or lower extremity swelling Gastrointestinal:  Denies nausea, heartburn or change in bowel habits Skin: Denies abnormal skin rashes Lymphatics: Denies new lymphadenopathy or easy bruising Neurological:Denies numbness, tingling or new weaknesses Behavioral/Psych: Mood is stable, no new changes  All other systems were reviewed with the patient and are negative.  MEDICAL HISTORY:  Past Medical History:  Diagnosis Date  . Anxiety   . CAD (coronary artery disease)    a. s/p DES to LAD and angioplasty to D1 in 05/2017  . CHF (congestive heart failure) ([facility])    Diastolic  . Chronic back pain   . Chronic pain   . COPD (chronic obstructive pulmonary disease) ([place])   . Degenerative disk disease   . Diabetes mellitus without complication ([place])   . History of medication noncompliance 11/2017   "the patient frequently self-adjusts medications without notifying her physicians"  . Hypertension   . Hypothyroidism   . On home O2    2.5 L N/C prn  . Pedal edema     SURGICAL HISTORY: Past Surgical History:  Procedure Laterality Date  . BIOPSY  09/09/2018  Procedure: BIOPSY;   Surgeon: Daneil Dolin, MD;  [facility];  Service: Endoscopy;;  gastric esophageal hepatic flexure colon  . COLONOSCOPY WITH PROPOFOL N/A 09/09/2018   Procedure: COLONOSCOPY WITH PROPOFOL;  Surgeon: Daneil Dolin, MD;  [facility];  Service: Endoscopy;  Laterality: N/A;  . CORONARY STENT INTERVENTION N/A 05/22/2017   Procedure: CORONARY STENT INTERVENTION;  Surgeon: Martinique, Peter M, MD;  Location: [place] [place] [facility];  Service: Cardiovascular;  Laterality: N/A;  . ESOPHAGOGASTRODUODENOSCOPY (EGD) WITH PROPOFOL N/A 09/09/2018   Procedure: ESOPHAGOGASTRODUODENOSCOPY (EGD) WITH PROPOFOL;  Surgeon: Daneil Dolin, MD;  [facility];  Service: Endoscopy;  Laterality: N/A;  . HERNIA REPAIR    . POLYPECTOMY  09/09/2018   Procedure: POLYPECTOMY;  Surgeon: Daneil Dolin, MD;  [facility];  Service: Endoscopy;;  colon  . RIGHT/LEFT HEART CATH AND CORONARY ANGIOGRAPHY N/A 05/19/2017   Procedure: RIGHT/LEFT HEART CATH AND CORONARY ANGIOGRAPHY;  Surgeon: Leonie Man, MD;  Location: [place] [facility];  Service: Cardiovascular;  Laterality: N/A;  . TUBAL LIGATION    . VIDEO BRONCHOSCOPY WITH ENDOBRONCHIAL ULTRASOUND Left 09/14/2018   Procedure: VIDEO BRONCHOSCOPY WITH ENDOBRONCHIAL ULTRASOUND with biopsies;  Surgeon: Margaretha Seeds, MD;  Location: [facility];  Service: Thoracic;  Laterality: Left;    I have reviewed the social history and family history with the patient and they are unchanged from previous note.  ALLERGIES:  is allergic to dilaudid [hydromorphone hcl]; midodrine hcl; actifed cold-allergy [chlorpheniramine-phenylephrine]; doxycycline; other; penicillins; reglan [metoclopramide]; valium [diazepam]; and vistaril [hydroxyzine hcl].  MEDICATIONS:  Current Outpatient Medications  Medication Sig Dispense Refill  . albuterol (PROAIR HFA) 108 (90 Base) MCG/ACT inhaler Inhale 2 puffs into the lungs every 4 (four) hours as needed for wheezing or  shortness of breath. For shortness of breath 18 g 0  . albuterol (PROVENTIL) (2.5 MG/3ML) 0.083% nebulizer solution Take 2.5 mg by nebulization 2 (two) times daily as needed for wheezing or shortness of breath.     . ALPRAZolam (XANAX) 1 MG tablet Take 1 mg by mouth 3 (three) times daily as needed for anxiety or sleep.     Marland Kitchen aspirin EC 81 MG tablet Take 81 mg by mouth daily.    Marland Kitchen atorvastatin (LIPITOR) 80 MG tablet Take 1 tablet (80 mg total) by mouth daily at 6 PM. 30 tablet 0  . cholecalciferol (VITAMIN D3) 25 MCG (1000 UT) tablet Take 5,000 Units by mouth daily.    . furosemide (LASIX) 40 MG tablet Take 60 mg (1 1/2 tabs) in the morning and 40 mg (1 tab) in the afternoon 75 tablet 6  . gabapentin (NEURONTIN) 600 MG tablet Take 600 mg by mouth 3 (three) times daily.     . Hydrocortisone (GERHARDT'S BUTT CREAM) CREA Apply 1 application topically 3 (three) times daily. (Patient taking differently: Apply 1 application topically 4 (four) times daily as needed for irritation. ) 90 each 0  . insulin aspart (NOVOLOG) 100 UNIT/ML injection Inject 1-18 Units into the skin 3 (three) times daily with meals. (Patient taking differently: Inject 0-20 Units into the skin 4 (four) times daily -  before meals and at bedtime. )    . insulin detemir (LEVEMIR) 100 UNIT/ML injection Inject 0.4 mLs (40 Units total) into the skin at bedtime. 10 mL 0  . levothyroxine (SYNTHROID, LEVOTHROID) 200 MCG tablet Take 1 tablet by mouth daily before breakfast.     . nitroGLYCERIN (NITROSTAT) 0.4 MG SL tablet  Place 0.4 mg under the tongue every 5 (five) minutes as needed for chest pain.     Marland Kitchen nystatin (MYCOSTATIN/NYSTOP) powder Apply topically 2 (two) times daily. (Patient taking differently: Apply 1 g topically 2 (two) times daily. ) 15 g 0  . Omega-3 Fatty Acids (FISH OIL PO) Take 1 capsule by mouth daily.     . ondansetron (ZOFRAN) 8 MG tablet Take 1 tablet (8 mg total) by mouth every 8 (eight) hours as needed for nausea or  vomiting. 120 tablet 0  . oxyCODONE-acetaminophen (PERCOCET) 10-325 MG tablet Take 1 tablet by mouth every 6 (six) hours as needed for pain.    . OXYGEN Inhale 2 L into the lungs continuous.    . pantoprazole (PROTONIX) 40 MG tablet Take 1 tablet (40 mg total) by mouth 2 (two) times daily. 180 tablet 3  . polyethylene glycol (MIRALAX / GLYCOLAX) 17 g packet Take 17 g by mouth 2 (two) times daily. 14 each 0  . potassium chloride (MICRO-K) 10 MEQ CR capsule Take 2 capsules (20 mEq total) by mouth 2 (two) times daily. *May take one additional tablet as needed for cramping 120 capsule 3  . prochlorperazine (COMPAZINE) 10 MG tablet Take 1 tablet (10 mg total) by mouth every 6 (six) hours as needed for nausea or vomiting. 30 tablet 0  . promethazine (PHENERGAN) 12.5 MG tablet Take 1 tablet (12.5 mg total) by mouth every 6 (six) hours as needed for nausea or vomiting. 45 tablet 1  . senna (SENOKOT) 8.6 MG TABS tablet Take 2 tablets (17.2 mg total) by mouth 2 (two) times daily with a meal. 120 each 0  . SURE COMFORT INS SYR 1CC/28G 28G X 1/2" 1 ML MISC USE TO INJECT INSULIN UP TO 4 TIMES DAILY. 150 each 5  . tiZANidine (ZANAFLEX) 4 MG tablet Take 1 tablet (4 mg total) by mouth every 8 (eight) hours as needed for muscle spasms. 30 tablet 0  . topiramate (TOPAMAX) 25 MG tablet Take 75 mg by mouth at bedtime.     Marland Kitchen umeclidinium-vilanterol (ANORO ELLIPTA) 62.5-25 MCG/INH AEPB Inhale 1 puff into the lungs daily. 30 each 0  . allopurinol (ZYLOPRIM) 300 MG tablet Take 1 tablet (300 mg total) by mouth daily for 15 days. 30 tablet 0   No current facility-administered medications for this visit.     PHYSICAL EXAMINATION: ECOG PERFORMANCE STATUS: 2 - Symptomatic, <50% confined to bed  Vitals:   12/01/18 1117  BP: (!) 149/59  Pulse: 94  Resp: 17  Temp: 98.6 F (37 C)  SpO2: 99%   Filed Weights   12/01/18 1117  Weight: 285 lb 4.8 oz (129.4 kg)   GENERAL:alert, no distress and comfortable, morbidly  obese, in wheelchair SKIN: skin color, texture, turgor are normal, no rashes or significant lesions EYES: normal, Conjunctiva are pink and non-injected, sclera clear NECK: supple, thyroid normal size, non-tender, without nodularity LYMPH:  no palpable lymphadenopathy in the cervical, axillary  LUNGS: clear to auscultation and percussion with normal breathing effort HEART: regular rate & rhythm and no murmurs and no lower extremity edema ABDOMEN:abdomen soft, non-tender and normal bowel sounds Musculoskeletal:no cyanosis of digits and no clubbing, (+) chronic leg edema and mild skin erythema and pigmentation above the ankles NEURO: alert & oriented x 3 with fluent speech, no focal motor/sensory deficits  LABORATORY DATA:  I have reviewed the data as listed CBC Latest Ref Rng & Units 12/01/2018 11/24/2018 11/05/2018  WBC 4.0 - 10.5 K/uL 4.4 6.4  6.3  Hemoglobin 12.0 - 15.0 g/dL 8.2(L) 8.5(L) 9.5(L)  Hematocrit 36.0 - 46.0 % 26.0(L) 27.4(L) 30.9(L)  Platelets 150 - 400 K/uL 221 334 304     CMP Latest Ref Rng & Units 12/01/2018 11/24/2018 11/05/2018  Glucose 70 - 99 mg/dL 124(H) 174(H) 192(H)  BUN 8 - 23 mg/dL 35(H) 24(H) 43(H)  Creatinine 0.44 - 1.00 mg/dL 1.23(H) 1.64(H) 1.80(H)  Sodium 135 - 145 mmol/L 142 142 141  Potassium 3.5 - 5.1 mmol/L 4.6 3.9 4.1  Chloride 98 - 111 mmol/L 107 106 105  CO2 22 - 32 mmol/L '26 25 24  ' Calcium 8.9 - 10.3 mg/dL 8.9 8.4(L) 8.8(L)  Total Protein 6.5 - 8.1 g/dL 6.2(L) 6.4(L) 6.8  Total Bilirubin 0.3 - 1.2 mg/dL 0.4 0.3 0.5  Alkaline Phos 38 - 126 U/L 96 107 96  AST 15 - 41 U/L 12(L) 14(L) 12(L)  ALT 0 - 44 U/L '7 6 12      ' RADIOGRAPHIC STUDIES: I have personally reviewed the radiological images as listed and agreed with the findings in the report. No results found.   ASSESSMENT & PLAN:  Stephanie Combs is a 68 y.o. female with   1. Small Cell Lung cancer, LUL, cT3N2M0, stage III, limited stage  -Newly diagnosedin 5/2020during her work up for  colon cancer -Ipreviouslydiscussed her lung cancer would take priority in terms of treatment given theaggressivenessof small cell lung cancer, and very high risk of recurrence evenwith curative intentconcurrentchemoradiation.  -She started first cycle chemo in hospital, second cycle only received day 1 treatment dye to COVID(+), she tolerated cycle 3 with dose reduction very well.  -She started RT and cycle 4 chemo on 11/24/18. Tolerating well so far  -Lab reviewed, CBC and CMP WNL except Hg 8.2, BG 124, BUN 35, Cr 1.23, Protein 6.2, albumin 3.1, AST 12. Overall stable, she may need blood transfusion next week -continue follow-up weekly -plan to give a total of 5 cycle chemo duo to significant dose reduction and late start RT. next cycle chemo in 2 weeks (last cycle)    2. 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  -Unfortunately she is not a candidate for colon cancer surgery due to very high risk ofsurgical mortality.She was seen by surgery during her recent hospitalization.  -I discussed the incurablenature of her colon cancer without surgery. -Willcontinueto monitor on current treatment for her SCLC. -if she recovers from [facility] for [facility], will consider low intensity chemo such as Xeloda or Irinotecan (which works for [employer] also). Given normal MMR, she is not a candidate for immunotherapy. Given primary right side colon cancer, not much benefit from EGFR inhibitors even if KRAS/NRAS/BRAF wild type.   3. 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  -anemia slightly worse due to chemo  -Continue monitoring -hg 8.2 today (12/01/18)  4. CAD, CHF, HTN, significant LE edema, CKD  -OnLipitor, lasix, nitro -Continue to f/u with cardiologist. -Her  CHFand fluid overload has muchimproved during her recent hospitalization. -She has been on 51m Lasix in the morning.She responded well to iv lasix which I give during his chemo  -I will hold her oral lasix and give iv lasix 212mdaily during chemo   5.DM,hypothyroidism, morbid obesity -On insulin novolog and long acting levemir. OnLevothyroxine. -Willmonitor glucose closely during her chemo treatment  6.Rheumatoidarthritis, chronic back pain  -On Percocet which  she takes 4 times a day.  -She is also hasGabapentin.  -She use to receiveinjectionfor her back -Pain chronic, stable. She will continue to see her RA and pain specialist.   7. Depression and anxiety -She has been more depressed with recent diagnosis of 2 cancers -She is currently on Celebrex, will continue.  -She notes she still has faith and understands her prognosis. I encouraged her to continue her faith.   8.Goal of care discussion, DNR/DNI -We previously discussed the incurable nature of her cancer, and the overall poor prognosis, especially ifshe does not have good response to chemotherapy or progress on chemo -The patient understands the goal of care is palliative. -I recommend DNR/DNI,she has agreed to DRI/DNR.She has written will and has established her daughter as her POA.  9. Recent COVID-19 -She was incidentally found to be positive for COVID-19on 10/11/2018, no respiratory symptoms or evidence of pneumonia,she hasGIsymptoms which could be related -Her testing on 11/04/18 was positive again. -no COVID symptoms    Plan: -lab reviewed, stable except worsening anemia  -Continue RT -Lab and f/u in one week, will schedule blood transfusion next week    No problem-specific Assessment & Plan notes found for this encounter.   No orders of the defined types were placed in this encounter.  All questions were answered. The patient knows to call the clinic with any problems, questions  or concerns. No barriers to learning was detected. I spent 15 minutes counseling the patient face to face. The total time spent in the appointment was 20 minutes and more than 50% was on counseling and review of test results     Truitt Merle, MD 12/01/2018   I, Joslyn Devon, am acting as scribe for Truitt Merle, MD.   I have reviewed the above documentation for accuracy and completeness, and I agree with the above.

## 2018-11-26 NOTE — Progress Notes (Signed)
Pt here for patient teaching.  Pt given Radiation and You booklet and Sonafine.  Reviewed areas of pertinence such as fatigue, hair loss, skin changes, throat changes, cough and shortness of breath . Pt able to give teach back of to pat skin, use unscented/gentle soap and drink plenty of water,apply Sonafine bid and avoid applying anything to skin within 4 hours of treatment. Pt verbalized understanding of information given and will contact nursing with any questions or concerns.     Gloriajean Dell. Leonie Green, BSN

## 2018-11-26 NOTE — Patient Instructions (Signed)
[facility] Discharge Instructions for Patients Receiving Chemotherapy  Today you received the following chemotherapy agents: etoposide  To help prevent nausea and vomiting after your treatment, we encourage you to take your nausea medication as directed.   If you develop nausea and vomiting that is not controlled by your nausea medication, call the clinic.   BELOW ARE SYMPTOMS THAT SHOULD BE REPORTED IMMEDIATELY:  *FEVER GREATER THAN 100.5 F  *CHILLS WITH OR WITHOUT FEVER  NAUSEA AND VOMITING THAT IS NOT CONTROLLED WITH YOUR NAUSEA MEDICATION  *UNUSUAL SHORTNESS OF BREATH  *UNUSUAL BRUISING OR BLEEDING  TENDERNESS IN MOUTH AND THROAT WITH OR WITHOUT PRESENCE OF ULCERS  *URINARY PROBLEMS  *BOWEL PROBLEMS  UNUSUAL RASH Items with * indicate a potential emergency and should be followed up as soon as possible.  Feel free to call the clinic should you have any questions or concerns. The clinic phone number is [phone].  Please show the CHEMO ALERT CARD at check-in to the Emergency Department and triage nurse.   

## 2018-11-29 ENCOUNTER — Ambulatory Visit: Payer: Medicare Other

## 2018-11-29 ENCOUNTER — Other Ambulatory Visit: Payer: Self-pay

## 2018-11-29 ENCOUNTER — Ambulatory Visit
Admission: RE | Admit: 2018-11-29 | Discharge: 2018-11-29 | Disposition: A | Discharge status: Home / Self Care | Payer: Medicare Other | Source: Ambulatory Visit | Attending: Radiation Oncology | Admitting: Radiation Oncology

## 2018-11-29 DIAGNOSIS — C3490 Malignant neoplasm of unspecified part of unspecified bronchus or lung: Secondary | ICD-10-CM | POA: Diagnosis not present

## 2018-11-29 DIAGNOSIS — E039 Hypothyroidism, unspecified: Secondary | ICD-10-CM | POA: Diagnosis not present

## 2018-11-29 DIAGNOSIS — I130 Hypertensive heart and chronic kidney disease with heart failure and stage 1 through stage 4 chronic kidney disease, or unspecified chronic kidney disease: Secondary | ICD-10-CM | POA: Diagnosis not present

## 2018-11-29 DIAGNOSIS — C3412 Malignant neoplasm of upper lobe, left bronchus or lung: Secondary | ICD-10-CM | POA: Diagnosis not present

## 2018-11-29 DIAGNOSIS — I110 Hypertensive heart disease with heart failure: Secondary | ICD-10-CM | POA: Diagnosis not present

## 2018-11-29 DIAGNOSIS — C19 Malignant neoplasm of rectosigmoid junction: Secondary | ICD-10-CM | POA: Diagnosis not present

## 2018-11-29 DIAGNOSIS — D509 Iron deficiency anemia, unspecified: Secondary | ICD-10-CM | POA: Diagnosis not present

## 2018-11-29 DIAGNOSIS — Z510 Encounter for antineoplastic radiation therapy: Secondary | ICD-10-CM | POA: Diagnosis not present

## 2018-11-29 DIAGNOSIS — N183 Chronic kidney disease, stage 3 (moderate): Secondary | ICD-10-CM | POA: Diagnosis not present

## 2018-11-29 DIAGNOSIS — I509 Heart failure, unspecified: Secondary | ICD-10-CM | POA: Diagnosis not present

## 2018-11-29 DIAGNOSIS — I5022 Chronic systolic (congestive) heart failure: Secondary | ICD-10-CM | POA: Diagnosis not present

## 2018-11-29 DIAGNOSIS — I2510 Atherosclerotic heart disease of native coronary artery without angina pectoris: Secondary | ICD-10-CM | POA: Diagnosis not present

## 2018-11-29 DIAGNOSIS — E1122 Type 2 diabetes mellitus with diabetic chronic kidney disease: Secondary | ICD-10-CM | POA: Diagnosis not present

## 2018-11-29 DIAGNOSIS — E119 Type 2 diabetes mellitus without complications: Secondary | ICD-10-CM | POA: Diagnosis not present

## 2018-11-29 DIAGNOSIS — I5033 Acute on chronic diastolic (congestive) heart failure: Secondary | ICD-10-CM | POA: Diagnosis not present

## 2018-11-29 NOTE — Patient Outreach (Signed)
Stephanie Combs) Care Management  11/29/2018   Stephanie Combs 03-27-1951 [id number]  Subjective:  Outreach for completion of telephonic assessment.  Objective:  Current Medications:  Current Outpatient Medications  Medication Sig Dispense Refill  . albuterol (PROAIR HFA) 108 (90 Base) MCG/ACT inhaler Inhale 2 puffs into the lungs every 4 (four) hours as needed for wheezing or shortness of breath. For shortness of breath 18 g 0  . albuterol (PROVENTIL) (2.5 MG/3ML) 0.083% nebulizer solution Take 2.5 mg by nebulization 2 (two) times daily as needed for wheezing or shortness of breath.     . allopurinol (ZYLOPRIM) 300 MG tablet Take 1 tablet (300 mg total) by mouth daily for 15 days. 30 tablet 0  . ALPRAZolam (XANAX) 1 MG tablet Take 1 mg by mouth 3 (three) times daily as needed for anxiety or sleep.     Marland Kitchen aspirin EC 81 MG tablet Take 81 mg by mouth daily.    Marland Kitchen atorvastatin (LIPITOR) 80 MG tablet Take 1 tablet (80 mg total) by mouth daily at 6 PM. 30 tablet 0  . cholecalciferol (VITAMIN D3) 25 MCG (1000 UT) tablet Take 5,000 Units by mouth daily.    . furosemide (LASIX) 40 MG tablet Take 60 mg (1 1/2 tabs) in the morning and 40 mg (1 tab) in the afternoon 75 tablet 6  . gabapentin (NEURONTIN) 600 MG tablet Take 600 mg by mouth 3 (three) times daily.     . Hydrocortisone (GERHARDT'S BUTT CREAM) CREA Apply 1 application topically 3 (three) times daily. (Patient taking differently: Apply 1 application topically 4 (four) times daily as needed for irritation. ) 90 each 0  . insulin aspart (NOVOLOG) 100 UNIT/ML injection Inject 1-18 Units into the skin 3 (three) times daily with meals. (Patient taking differently: Inject 0-20 Units into the skin 4 (four) times daily -  before meals and at bedtime. )    . insulin detemir (LEVEMIR) 100 UNIT/ML injection Inject 0.4 mLs (40 Units total) into the skin at bedtime. 10 mL 0  . levothyroxine (SYNTHROID, LEVOTHROID) 200 MCG tablet Take 1 tablet  by mouth daily before breakfast.     . nitroGLYCERIN (NITROSTAT) 0.4 MG SL tablet Place 0.4 mg under the tongue every 5 (five) minutes as needed for chest pain.     Marland Kitchen nystatin (MYCOSTATIN/NYSTOP) powder Apply topically 2 (two) times daily. (Patient taking differently: Apply 1 g topically 2 (two) times daily. ) 15 g 0  . Omega-3 Fatty Acids (FISH OIL PO) Take 1 capsule by mouth daily.     . ondansetron (ZOFRAN) 8 MG tablet Take 1 tablet (8 mg total) by mouth every 8 (eight) hours as needed for nausea or vomiting. 120 tablet 0  . oxyCODONE-acetaminophen (PERCOCET) 10-325 MG tablet Take 1 tablet by mouth every 6 (six) hours as needed for pain.    . OXYGEN Inhale 2 L into the lungs continuous.    . pantoprazole (PROTONIX) 40 MG tablet Take 1 tablet (40 mg total) by mouth 2 (two) times daily. 180 tablet 3  . polyethylene glycol (MIRALAX / GLYCOLAX) 17 g packet Take 17 g by mouth 2 (two) times daily. 14 each 0  . potassium chloride (MICRO-K) 10 MEQ CR capsule Take 2 capsules (20 mEq total) by mouth 2 (two) times daily. *May take one additional tablet as needed for cramping 120 capsule 3  . prochlorperazine (COMPAZINE) 10 MG tablet Take 1 tablet (10 mg total) by mouth every 6 (six) hours as needed for nausea  or vomiting. 30 tablet 0  . promethazine (PHENERGAN) 12.5 MG tablet Take 1 tablet (12.5 mg total) by mouth every 6 (six) hours as needed for nausea or vomiting. 45 tablet 1  . senna (SENOKOT) 8.6 MG TABS tablet Take 2 tablets (17.2 mg total) by mouth 2 (two) times daily with a meal. 120 each 0  . SURE COMFORT INS SYR 1CC/28G 28G X 1/2" 1 ML MISC USE TO INJECT INSULIN UP TO 4 TIMES DAILY. 150 each 5  . tiZANidine (ZANAFLEX) 4 MG tablet Take 1 tablet (4 mg total) by mouth every 8 (eight) hours as needed for muscle spasms. 30 tablet 0  . topiramate (TOPAMAX) 25 MG tablet Take 75 mg by mouth at bedtime.     Marland Kitchen umeclidinium-vilanterol (ANORO ELLIPTA) 62.5-25 MCG/INH AEPB Inhale 1 puff into the lungs daily. 30  each 0   No current facility-administered medications for this visit.     Functional Status:  In your present state of health, do you have any difficulty performing the following activities: 11/29/2018  Hearing? N  Vision? N  Difficulty concentrating or making decisions? N  Walking or climbing stairs? Y  Dressing or bathing? Y  Doing errands, shopping? Y  Preparing Food and eating ? Y  Comment Due to fatigue.  Using the Toilet? N  In the past six months, have you accidently leaked urine? N  Do you have problems with loss of bowel control? N  Managing your Medications? N  Managing your Finances? Y  Comment Daughter assisting  Housekeeping or managing your Housekeeping? Y  Comment Daughter assisting  Some recent data might be hidden    Fall/Depression Screening: Fall Risk  11/29/2018 11/10/2018 10/26/2017  Falls in the past year? 0 0 Yes  Number falls in past yr: - - 2 or more  Injury with Fall? - - Yes  Risk Factor Category  - - High Fall Risk  Risk for fall due to : Impaired balance/gait;Medication side effect Impaired balance/gait History of fall(s)  Risk for fall due to: Comment - - Due to orthostasis  Follow up Falls prevention discussed Falls prevention discussed Falls evaluation completed;Falls prevention discussed   PHQ 2/9 Scores 11/29/2018 11/10/2018 10/26/2017 08/19/2016 06/17/2016 06/17/2016  PHQ - 2 Score 1 1 1  0 2 0  PHQ- 9 Score - - - - 11 -    Assessment:  Telephonic assessment complete. Stephanie Combs reports feeling very weak and tired. She received radiation therapy earlier today. Denies complaints of shortness of breath. No complaints of chest discomfort or palpitations. She reports intermittent 6/10 back pain. Describes this as "normal chronic pain." States it is well controlled with Percocet. Denies feeling faint or dizzy.   Medications Reviewed. She reports taking medications as prescribed. Denies concerns regarding medication management or affordability. She attempts  to monitor her blood sugar several times a day as recommended, but reports skipping due to fatigue. She does not monitor her weight at home. Reports being weighed prior to chemo and radiation treatments. She is aware of CHF weight parameters.   Discussed care management needs. She has difficulty ambulating and requires assistance in the home. Denies recent falls. Reports that family and friends are available to assist as needed. She denies current need for additional in-home assistance but will provide update if needed. She has multiple weekly appointments. Reports that family members are currently available to provide transportation. Denies need for nutritional support or community resource referrals.    [facility] CM Care Plan Problem One  Most Recent Value  Care Plan Problem One  High Risk for Admission  Role Documenting the Problem One  Care Management [place] for Problem One  Active  [facility] Long Term Goal   Over the next 90 days, patient will not be hospitalized for complications related to chronic illnesses.  [facility] Long Term Goal Start Date  11/29/18  Interventions for Problem One Long Term Goal  Reviewed medications and indications for use. Discussed nutrition, weight parameters and blood glucose ranges.   [facility] CM Short Term Goal #1   Over the next 30 days patient will take all medications as prescribed.  [facility] CM Short Term Goal #1 Start Date  11/29/18  Interventions for Short Term Goal #1  Reviewed medications and indications for use. Sharma Covert [Denied need for medication assistance.]  [facility] CM Short Term Goal #2   Over the next 30 days, patient will attend all appointments and provider follow-ups as scheduled.  [facility] CM Short Term Goal #2 Start Date  11/29/18  Interventions for Short Term Goal #2  Discussed pending MD follow-ups. Encouraged to attend appointments as scheduled to prevent delays in care.  [Denied current need for transporation assistance.]  [facility] CM Short Term Goal #3  Over the next  30 days, patient will consistently monitor and record her blood sugar levels.  [facility] CM Short Term Goal #3 Start Date  11/29/18  Interventions for Short Tern Goal #3  Discussed importance of daily blood glucose monitoring. Reviewed s/sx of hypoglycemia and hyperglycemia. Encouraged to log readings.    [facility] CM Care Plan Problem Two     Most Recent Value  Care Plan Problem Two  High Risk for Falls  Role Documenting the Problem Two  Care Management [place] for Problem Two  Active  Interventions for Problem Two Long Term Goal   Educated regarding in-home safety and fall prevention measures.  [facility] Long Term Goal  Patient will not sustain injuries d/t falls over the next 60 days.  [facility] Long Term Goal Start Date  11/29/18  [facility] CM Short Term Goal #1   Over the next 30 days, patient will use assistive device when ambulating inside and outside of the home.  [facility] CM Short Term Goal #1 Start Date  11/29/18  Interventions for Short Term Goal #2   Encouraged to utilize assistive devices to prevent falls.   [facility] CM Short Term Goal #2   Over the next 30 days, patient will refrain from activities that cause overexertion.  [facility] CM Short Term Goal #2 Start Date  11/29/18  Interventions for Short Term Goal #2  Discussed safety measures and importance of avoiding overly strenous activities.   [Declined need for additional assistance in the home.]      PLAN -Will follow-up within two weeks.   [place] [phone]

## 2018-11-30 ENCOUNTER — Ambulatory Visit: Payer: Medicare Other

## 2018-11-30 ENCOUNTER — Other Ambulatory Visit: Payer: Self-pay

## 2018-11-30 ENCOUNTER — Ambulatory Visit
Admission: RE | Admit: 2018-11-30 | Discharge: 2018-11-30 | Disposition: A | Discharge status: Home / Self Care | Payer: Medicare Other | Source: Ambulatory Visit | Attending: Radiation Oncology | Admitting: Radiation Oncology

## 2018-11-30 DIAGNOSIS — Z510 Encounter for antineoplastic radiation therapy: Secondary | ICD-10-CM | POA: Diagnosis not present

## 2018-11-30 DIAGNOSIS — I110 Hypertensive heart disease with heart failure: Secondary | ICD-10-CM | POA: Diagnosis not present

## 2018-11-30 DIAGNOSIS — C3412 Malignant neoplasm of upper lobe, left bronchus or lung: Secondary | ICD-10-CM | POA: Diagnosis not present

## 2018-11-30 DIAGNOSIS — C19 Malignant neoplasm of rectosigmoid junction: Secondary | ICD-10-CM | POA: Diagnosis not present

## 2018-11-30 DIAGNOSIS — C3490 Malignant neoplasm of unspecified part of unspecified bronchus or lung: Secondary | ICD-10-CM | POA: Diagnosis not present

## 2018-11-30 DIAGNOSIS — E039 Hypothyroidism, unspecified: Secondary | ICD-10-CM | POA: Diagnosis not present

## 2018-11-30 DIAGNOSIS — D509 Iron deficiency anemia, unspecified: Secondary | ICD-10-CM | POA: Diagnosis not present

## 2018-11-30 DIAGNOSIS — I2510 Atherosclerotic heart disease of native coronary artery without angina pectoris: Secondary | ICD-10-CM | POA: Diagnosis not present

## 2018-11-30 DIAGNOSIS — I509 Heart failure, unspecified: Secondary | ICD-10-CM | POA: Diagnosis not present

## 2018-11-30 DIAGNOSIS — E119 Type 2 diabetes mellitus without complications: Secondary | ICD-10-CM | POA: Diagnosis not present

## 2018-12-01 ENCOUNTER — Telehealth: Payer: Self-pay | Admitting: Hematology

## 2018-12-01 ENCOUNTER — Ambulatory Visit
Admission: RE | Admit: 2018-12-01 | Discharge: 2018-12-01 | Disposition: A | Discharge status: Home / Self Care | Payer: Medicare Other | Source: Ambulatory Visit | Attending: Radiation Oncology | Admitting: Radiation Oncology

## 2018-12-01 ENCOUNTER — Inpatient Hospital Stay (HOSPITAL_BASED_OUTPATIENT_CLINIC_OR_DEPARTMENT_OTHER): Payer: Medicare Other | Admitting: Hematology

## 2018-12-01 ENCOUNTER — Other Ambulatory Visit: Payer: Self-pay

## 2018-12-01 ENCOUNTER — Encounter: Payer: Self-pay | Admitting: Hematology

## 2018-12-01 ENCOUNTER — Ambulatory Visit: Payer: Medicare Other

## 2018-12-01 ENCOUNTER — Inpatient Hospital Stay: Payer: Medicare Other

## 2018-12-01 VITALS — BP 149/59 | HR 94 | Temp 98.6°F | Resp 17 | Ht 61.5 in | Wt 285.3 lb

## 2018-12-01 DIAGNOSIS — Z8619 Personal history of other infectious and parasitic diseases: Secondary | ICD-10-CM

## 2018-12-01 DIAGNOSIS — M7989 Other specified soft tissue disorders: Secondary | ICD-10-CM | POA: Diagnosis not present

## 2018-12-01 DIAGNOSIS — I700 Atherosclerosis of aorta: Secondary | ICD-10-CM | POA: Diagnosis not present

## 2018-12-01 DIAGNOSIS — D3502 Benign neoplasm of left adrenal gland: Secondary | ICD-10-CM

## 2018-12-01 DIAGNOSIS — J449 Chronic obstructive pulmonary disease, unspecified: Secondary | ICD-10-CM

## 2018-12-01 DIAGNOSIS — D61818 Other pancytopenia: Secondary | ICD-10-CM | POA: Diagnosis not present

## 2018-12-01 DIAGNOSIS — E119 Type 2 diabetes mellitus without complications: Secondary | ICD-10-CM | POA: Diagnosis not present

## 2018-12-01 DIAGNOSIS — Z794 Long term (current) use of insulin: Secondary | ICD-10-CM

## 2018-12-01 DIAGNOSIS — C3412 Malignant neoplasm of upper lobe, left bronchus or lung: Secondary | ICD-10-CM | POA: Diagnosis not present

## 2018-12-01 DIAGNOSIS — N189 Chronic kidney disease, unspecified: Secondary | ICD-10-CM

## 2018-12-01 DIAGNOSIS — K449 Diaphragmatic hernia without obstruction or gangrene: Secondary | ICD-10-CM

## 2018-12-01 DIAGNOSIS — Z7982 Long term (current) use of aspirin: Secondary | ICD-10-CM

## 2018-12-01 DIAGNOSIS — R590 Localized enlarged lymph nodes: Secondary | ICD-10-CM

## 2018-12-01 DIAGNOSIS — I10 Essential (primary) hypertension: Secondary | ICD-10-CM

## 2018-12-01 DIAGNOSIS — M069 Rheumatoid arthritis, unspecified: Secondary | ICD-10-CM

## 2018-12-01 DIAGNOSIS — D509 Iron deficiency anemia, unspecified: Secondary | ICD-10-CM | POA: Diagnosis not present

## 2018-12-01 DIAGNOSIS — Z79899 Other long term (current) drug therapy: Secondary | ICD-10-CM

## 2018-12-01 DIAGNOSIS — Z5111 Encounter for antineoplastic chemotherapy: Secondary | ICD-10-CM | POA: Diagnosis not present

## 2018-12-01 DIAGNOSIS — I2510 Atherosclerotic heart disease of native coronary artery without angina pectoris: Secondary | ICD-10-CM | POA: Diagnosis not present

## 2018-12-01 DIAGNOSIS — E1122 Type 2 diabetes mellitus with diabetic chronic kidney disease: Secondary | ICD-10-CM

## 2018-12-01 DIAGNOSIS — C183 Malignant neoplasm of hepatic flexure: Secondary | ICD-10-CM

## 2018-12-01 DIAGNOSIS — G8929 Other chronic pain: Secondary | ICD-10-CM

## 2018-12-01 DIAGNOSIS — J90 Pleural effusion, not elsewhere classified: Secondary | ICD-10-CM | POA: Diagnosis not present

## 2018-12-01 DIAGNOSIS — I509 Heart failure, unspecified: Secondary | ICD-10-CM | POA: Diagnosis not present

## 2018-12-01 DIAGNOSIS — E039 Hypothyroidism, unspecified: Secondary | ICD-10-CM

## 2018-12-01 DIAGNOSIS — Z510 Encounter for antineoplastic radiation therapy: Secondary | ICD-10-CM | POA: Diagnosis not present

## 2018-12-01 DIAGNOSIS — C19 Malignant neoplasm of rectosigmoid junction: Secondary | ICD-10-CM | POA: Diagnosis not present

## 2018-12-01 DIAGNOSIS — N183 Chronic kidney disease, stage 3 unspecified: Secondary | ICD-10-CM

## 2018-12-01 DIAGNOSIS — D6481 Anemia due to antineoplastic chemotherapy: Secondary | ICD-10-CM

## 2018-12-01 DIAGNOSIS — E669 Obesity, unspecified: Secondary | ICD-10-CM

## 2018-12-01 DIAGNOSIS — I129 Hypertensive chronic kidney disease with stage 1 through stage 4 chronic kidney disease, or unspecified chronic kidney disease: Secondary | ICD-10-CM

## 2018-12-01 DIAGNOSIS — I5032 Chronic diastolic (congestive) heart failure: Secondary | ICD-10-CM

## 2018-12-01 DIAGNOSIS — F329 Major depressive disorder, single episode, unspecified: Secondary | ICD-10-CM

## 2018-12-01 DIAGNOSIS — T451X5A Adverse effect of antineoplastic and immunosuppressive drugs, initial encounter: Secondary | ICD-10-CM

## 2018-12-01 DIAGNOSIS — I110 Hypertensive heart disease with heart failure: Secondary | ICD-10-CM | POA: Diagnosis not present

## 2018-12-01 DIAGNOSIS — C3490 Malignant neoplasm of unspecified part of unspecified bronchus or lung: Secondary | ICD-10-CM | POA: Diagnosis not present

## 2018-12-01 DIAGNOSIS — N182 Chronic kidney disease, stage 2 (mild): Secondary | ICD-10-CM

## 2018-12-01 LAB — CBC WITH DIFFERENTIAL (CANCER CENTER ONLY)
Abs Immature Granulocytes: 0.06 10*3/uL (ref 0.00–0.07)
Basophils Absolute: 0.1 10*3/uL (ref 0.0–0.1)
Basophils Relative: 2 %
Eosinophils Absolute: 0 10*3/uL (ref 0.0–0.5)
Eosinophils Relative: 1 %
HCT: 26 % — ABNORMAL LOW (ref 36.0–46.0)
Hemoglobin: 8.2 g/dL — ABNORMAL LOW (ref 12.0–15.0)
Immature Granulocytes: 1 %
Lymphocytes Relative: 25 %
Lymphs Abs: 1.1 10*3/uL (ref 0.7–4.0)
MCH: 30.8 pg (ref 26.0–34.0)
MCHC: 31.5 g/dL (ref 30.0–36.0)
MCV: 97.7 fL (ref 80.0–100.0)
Monocytes Absolute: 0.1 10*3/uL (ref 0.1–1.0)
Monocytes Relative: 1 %
Neutro Abs: 3.1 10*3/uL (ref 1.7–7.7)
Neutrophils Relative %: 70 %
Platelet Count: 221 10*3/uL (ref 150–400)
RBC: 2.66 MIL/uL — ABNORMAL LOW (ref 3.87–5.11)
RDW: 18.5 % — ABNORMAL HIGH (ref 11.5–15.5)
WBC Count: 4.4 10*3/uL (ref 4.0–10.5)
nRBC: 0 % (ref 0.0–0.2)

## 2018-12-01 LAB — CMP (CANCER CENTER ONLY)
ALT: 7 U/L (ref 0–44)
AST: 12 U/L — ABNORMAL LOW (ref 15–41)
Albumin: 3.1 g/dL — ABNORMAL LOW (ref 3.5–5.0)
Alkaline Phosphatase: 96 U/L (ref 38–126)
Anion gap: 9 (ref 5–15)
BUN: 35 mg/dL — ABNORMAL HIGH (ref 8–23)
CO2: 26 mmol/L (ref 22–32)
Calcium: 8.9 mg/dL (ref 8.9–10.3)
Chloride: 107 mmol/L (ref 98–111)
Creatinine: 1.23 mg/dL — ABNORMAL HIGH (ref 0.44–1.00)
GFR, Est AFR Am: 52 mL/min — ABNORMAL LOW (ref >60)
GFR, Estimated: 45 mL/min — ABNORMAL LOW (ref >60)
Glucose, Bld: 124 mg/dL — ABNORMAL HIGH (ref 70–99)
Potassium: 4.6 mmol/L (ref 3.5–5.1)
Sodium: 142 mmol/L (ref 135–145)
Total Bilirubin: 0.4 mg/dL (ref 0.3–1.2)
Total Protein: 6.2 g/dL — ABNORMAL LOW (ref 6.5–8.1)

## 2018-12-01 NOTE — Telephone Encounter (Signed)
Sent a message to get blood added for 7/29.

## 2018-12-02 ENCOUNTER — Ambulatory Visit: Payer: Medicare Other

## 2018-12-02 ENCOUNTER — Ambulatory Visit
Admission: RE | Admit: 2018-12-02 | Discharge: 2018-12-02 | Disposition: A | Discharge status: Home / Self Care | Payer: Medicare Other | Source: Ambulatory Visit | Attending: Radiation Oncology | Admitting: Radiation Oncology

## 2018-12-02 ENCOUNTER — Other Ambulatory Visit: Payer: Self-pay

## 2018-12-02 DIAGNOSIS — I509 Heart failure, unspecified: Secondary | ICD-10-CM | POA: Diagnosis not present

## 2018-12-02 DIAGNOSIS — D509 Iron deficiency anemia, unspecified: Secondary | ICD-10-CM | POA: Diagnosis not present

## 2018-12-02 DIAGNOSIS — C3490 Malignant neoplasm of unspecified part of unspecified bronchus or lung: Secondary | ICD-10-CM | POA: Diagnosis not present

## 2018-12-02 DIAGNOSIS — E039 Hypothyroidism, unspecified: Secondary | ICD-10-CM | POA: Diagnosis not present

## 2018-12-02 DIAGNOSIS — I110 Hypertensive heart disease with heart failure: Secondary | ICD-10-CM | POA: Diagnosis not present

## 2018-12-02 DIAGNOSIS — C3412 Malignant neoplasm of upper lobe, left bronchus or lung: Secondary | ICD-10-CM | POA: Diagnosis not present

## 2018-12-02 DIAGNOSIS — E119 Type 2 diabetes mellitus without complications: Secondary | ICD-10-CM | POA: Diagnosis not present

## 2018-12-02 DIAGNOSIS — Z510 Encounter for antineoplastic radiation therapy: Secondary | ICD-10-CM | POA: Diagnosis not present

## 2018-12-02 DIAGNOSIS — I2510 Atherosclerotic heart disease of native coronary artery without angina pectoris: Secondary | ICD-10-CM | POA: Diagnosis not present

## 2018-12-02 DIAGNOSIS — C19 Malignant neoplasm of rectosigmoid junction: Secondary | ICD-10-CM | POA: Diagnosis not present

## 2018-12-03 ENCOUNTER — Ambulatory Visit: Payer: Medicare Other

## 2018-12-03 ENCOUNTER — Other Ambulatory Visit: Payer: Self-pay | Admitting: Radiation Oncology

## 2018-12-03 ENCOUNTER — Telehealth: Payer: Self-pay

## 2018-12-03 ENCOUNTER — Ambulatory Visit
Admission: RE | Admit: 2018-12-03 | Discharge: 2018-12-03 | Disposition: A | Discharge status: Home / Self Care | Payer: Medicare Other | Source: Ambulatory Visit | Attending: Radiation Oncology | Admitting: Radiation Oncology

## 2018-12-03 ENCOUNTER — Other Ambulatory Visit: Payer: Self-pay

## 2018-12-03 DIAGNOSIS — E039 Hypothyroidism, unspecified: Secondary | ICD-10-CM | POA: Diagnosis not present

## 2018-12-03 DIAGNOSIS — D509 Iron deficiency anemia, unspecified: Secondary | ICD-10-CM | POA: Diagnosis not present

## 2018-12-03 DIAGNOSIS — I5033 Acute on chronic diastolic (congestive) heart failure: Secondary | ICD-10-CM | POA: Diagnosis not present

## 2018-12-03 DIAGNOSIS — I130 Hypertensive heart and chronic kidney disease with heart failure and stage 1 through stage 4 chronic kidney disease, or unspecified chronic kidney disease: Secondary | ICD-10-CM | POA: Diagnosis not present

## 2018-12-03 DIAGNOSIS — E1122 Type 2 diabetes mellitus with diabetic chronic kidney disease: Secondary | ICD-10-CM | POA: Diagnosis not present

## 2018-12-03 DIAGNOSIS — I509 Heart failure, unspecified: Secondary | ICD-10-CM | POA: Diagnosis not present

## 2018-12-03 DIAGNOSIS — I5022 Chronic systolic (congestive) heart failure: Secondary | ICD-10-CM | POA: Diagnosis not present

## 2018-12-03 DIAGNOSIS — C19 Malignant neoplasm of rectosigmoid junction: Secondary | ICD-10-CM | POA: Diagnosis not present

## 2018-12-03 DIAGNOSIS — I2510 Atherosclerotic heart disease of native coronary artery without angina pectoris: Secondary | ICD-10-CM | POA: Diagnosis not present

## 2018-12-03 DIAGNOSIS — C3412 Malignant neoplasm of upper lobe, left bronchus or lung: Secondary | ICD-10-CM | POA: Diagnosis not present

## 2018-12-03 DIAGNOSIS — I110 Hypertensive heart disease with heart failure: Secondary | ICD-10-CM | POA: Diagnosis not present

## 2018-12-03 DIAGNOSIS — Z510 Encounter for antineoplastic radiation therapy: Secondary | ICD-10-CM | POA: Diagnosis not present

## 2018-12-03 DIAGNOSIS — E119 Type 2 diabetes mellitus without complications: Secondary | ICD-10-CM | POA: Diagnosis not present

## 2018-12-03 DIAGNOSIS — C3490 Malignant neoplasm of unspecified part of unspecified bronchus or lung: Secondary | ICD-10-CM | POA: Diagnosis not present

## 2018-12-03 DIAGNOSIS — N183 Chronic kidney disease, stage 3 (moderate): Secondary | ICD-10-CM | POA: Diagnosis not present

## 2018-12-03 MED ORDER — SUCRALFATE 1 G PO TABS: 1.0000 g | ORAL_TABLET | Freq: Three times a day (TID) | ORAL | 2 refills | Status: AC

## 2018-12-03 NOTE — Telephone Encounter (Signed)
Patient calls stating she is out of the 2x2 gauze pads with Silvadene cream, her pharmacy told her it would cost $1,000, instructed patient to use her 2x2 gauze pads and apply a small amount of Silvadene cream to it to pack her wound on her abdomen.  Silvadene cream 4 oz jar with 1 refill was called into [facility].

## 2018-12-06 ENCOUNTER — Ambulatory Visit
Admission: RE | Admit: 2018-12-06 | Discharge: 2018-12-06 | Disposition: A | Discharge status: Home / Self Care | Payer: Medicare Other | Source: Ambulatory Visit | Attending: Radiation Oncology | Admitting: Radiation Oncology

## 2018-12-06 ENCOUNTER — Ambulatory Visit: Payer: Medicare Other

## 2018-12-06 ENCOUNTER — Other Ambulatory Visit: Payer: Self-pay

## 2018-12-06 DIAGNOSIS — C3412 Malignant neoplasm of upper lobe, left bronchus or lung: Secondary | ICD-10-CM | POA: Diagnosis not present

## 2018-12-06 DIAGNOSIS — E119 Type 2 diabetes mellitus without complications: Secondary | ICD-10-CM | POA: Diagnosis not present

## 2018-12-06 DIAGNOSIS — Z510 Encounter for antineoplastic radiation therapy: Secondary | ICD-10-CM | POA: Diagnosis not present

## 2018-12-06 DIAGNOSIS — C19 Malignant neoplasm of rectosigmoid junction: Secondary | ICD-10-CM | POA: Diagnosis not present

## 2018-12-06 DIAGNOSIS — I110 Hypertensive heart disease with heart failure: Secondary | ICD-10-CM | POA: Diagnosis not present

## 2018-12-06 DIAGNOSIS — I2510 Atherosclerotic heart disease of native coronary artery without angina pectoris: Secondary | ICD-10-CM | POA: Diagnosis not present

## 2018-12-06 DIAGNOSIS — C3490 Malignant neoplasm of unspecified part of unspecified bronchus or lung: Secondary | ICD-10-CM | POA: Diagnosis not present

## 2018-12-06 DIAGNOSIS — D509 Iron deficiency anemia, unspecified: Secondary | ICD-10-CM | POA: Diagnosis not present

## 2018-12-06 DIAGNOSIS — E039 Hypothyroidism, unspecified: Secondary | ICD-10-CM | POA: Diagnosis not present

## 2018-12-06 DIAGNOSIS — I509 Heart failure, unspecified: Secondary | ICD-10-CM | POA: Diagnosis not present

## 2018-12-06 NOTE — Progress Notes (Signed)
Stephanie Combs   Telephone:(336) [phone] Fax:(336) [phone]   Clinic Follow up Note   Patient Care Team: Stephanie Gravel, MD as PCP - General (Internal Medicine) Stephanie Commons, MD as PCP - Cardiology (Cardiology) Stephanie Labella, RN as [place] Management Stephanie Merle, MD as Consulting Physician (Hematology)  Date of Service:  12/08/2018  CHIEF COMPLAINT: F/u ofright colon cancerand Small cell lung cancer  SUMMARY OF ONCOLOGIC HISTORY: Oncology History Overview Note  Cancer Staging Primary cancer of hepatic flexure of colon [facility]) Staging form: Colon and Rectum, AJCC 8th Edition - Clinical stage from 09/09/2018: Stage Unknown (cTX, cN0, cM0) - Signed by Stephanie Merle, MD on 09/28/2018  Small cell lung cancer, left upper lobe ([place]) Staging form: Lung, AJCC 8th Edition - Clinical stage from 09/14/2018: Stage IIIB (cT3, cN2, cM0) - Signed by Stephanie Merle, MD on 09/28/2018     Primary cancer of hepatic flexure of colon ([place]) (Resolved)   Initial Diagnosis   Malignant neoplasm of hepatic flexure ([place])   09/09/2018 Procedure   Coloscopy by Dr Gala Romney 09/09/18  IMPRESSION -Colon mass as described in the vicinity of the hepatic flexure - status post biopsy and inking. Polyps associated with this tumor scattered all the way to the cecum were not removed; multiple splenic flexure and rectal polyps were removed as described above. Left-sided diverticulosis.  Upper endoscopy By Dr Gala Romney 09/09/18  IMPRESSION -Abnormal distal esophagus suspicious for Barrett's esophagus?biopsied. - Small hiatal hernia. - Erosive gastropathy. Biopsied. -Duodenal bulbar erosions   09/09/2018 Initial Biopsy   Diagnosis 09/09/18 1. Stomach, biopsy - GASTRIC ANTRAL MUCOSA WITH MILD NONSPECIFIC REACTIVE GASTROPATHY - GASTRIC OXYNTIC MUCOSA WITH PARIETAL CELL HYPERPLASIA AS CAN BE SEEN IN HYPERGASTRINEMIC STATES SUCH AS PROPER PATIENT IDENTIFICATION THERAPY. - WARTHIN STARRY STAIN IS  NEGATIVE FOR HELICOBACTER PYLORI 2. Esophagus, biopsy - BARRETT'S ESOPHAGUS, NEGATIVE FOR DYSPLASIA 3. Colon, polyp(s), descending - TUBULAR ADENOMA - NEGATIVE FOR HIGH-GRADE DYSPLASIA OR MALIGNANCY 4. Colon, biopsy, hepatic flexure - ADENOCARCINOMA. SEE NOTE 5. Colon, polyp(s), splenic flexure - ADENOCARCINOMA. SEE NOTE - OTHER FRAGMENTS OF TUBULAR ADENOMA(S) WITH ONE SHOWING HIGH-GRADE DYSPLASIA 6. Rectum, polyp(s) - TUBULAR ADENOMA(S) - NEGATIVE FOR HIGH-GRADE DYSPLASIA OR MALIGNANCY   09/09/2018 Cancer Staging   Staging form: Colon and Rectum, AJCC 8th Edition - Clinical stage from 09/09/2018: Stage Unknown (cTX, cN0, cM0) - Signed by Stephanie Merle, MD on 09/28/2018   09/11/2018 Imaging   CT CAP 09/11/18  IMPRESSION: 1. Central perihilar left upper lobe 5.4 cm lung mass, new since 01/27/2017 chest CT, compatible with malignancy, favor primary bronchogenic carcinoma. Postobstructive pneumonia/atelectasis in the lingula. 2. Mediastinal adenopathy in the left prevascular/AP window and subcarinal stations, compatible with metastatic nodes (more likely originating from the left upper lobe lung mass). 3. Trace dependent left pleural effusion. 4. Apple-core lesion in the ascending colon, compatible with known primary colonic neoplasm. Adjacent top-normal size right mesenteric lymph node is equivocal for locoregional nodal metastasis. No additional potential findings of metastatic disease in the abdomen or pelvis. 5. Chronic findings include: Aortic Atherosclerosis (ICD10-I70.0). Borderline mild cardiomegaly. One vessel coronary atherosclerosis. Dilated main pulmonary artery, suggesting pulmonary arterial hypertension. Left adrenal adenoma. Punctate bilateral nephrolithiasis. Small hiatal hernia.   09/20/2018 Imaging   Brain MRI 09/20/18  IMPRESSION: 1. Motion degraded examination without brain metastases identified. 2. Small skull metastases not excluded. 3. Moderate to severe chronic  small vessel ischemic disease.   Small cell lung cancer, left upper lobe ([place])  09/11/2018 Imaging   CT  CAP 09/11/18  IMPRESSION: 1. Central perihilar left upper lobe 5.4 cm lung mass, new since 01/27/2017 chest CT, compatible with malignancy, favor primary bronchogenic carcinoma. Postobstructive pneumonia/atelectasis in the lingula. 2. Mediastinal adenopathy in the left prevascular/AP window and subcarinal stations, compatible with metastatic nodes (more likely originating from the left upper lobe lung mass). 3. Trace dependent left pleural effusion. 4. Apple-core lesion in the ascending colon, compatible with known primary colonic neoplasm. Adjacent top-normal size right mesenteric lymph node is equivocal for locoregional nodal metastasis. No additional potential findings of metastatic disease in the abdomen or pelvis. 5. Chronic findings include: Aortic Atherosclerosis (ICD10-I70.0). Borderline mild cardiomegaly. One vessel coronary atherosclerosis. Dilated main pulmonary artery, suggesting pulmonary arterial hypertension. Left adrenal adenoma. Punctate bilateral nephrolithiasis. Small hiatal hernia.    09/14/2018 Initial Biopsy   Diagnosis 5/5.20 Lung, biopsy, LUL - SMALL CELL CARCINOMA.   09/14/2018 Cancer Staging   Staging form: Lung, AJCC 8th Edition - Clinical stage from 09/14/2018: Stage IIIB (cT3, cN2, cM0) - Signed by Stephanie Merle, MD on 09/28/2018   09/15/2018 Initial Diagnosis   Small cell lung cancer, left upper lobe ([place])   09/18/2018 -  Chemotherapy   Carboplatin and etoposide every 3 weeks for 4 cycles starting 09/18/18. Held after C2D1 due to COVID-19(+). She restarted with cycle 3 on 11/03/18. Plan to complete 5 cycles on 12/17/18.  RT was concurrnet but started on 11/24/18.   09/20/2018 Imaging   Brain MRI 09/20/18  IMPRESSION: 1. Motion degraded examination without brain metastases identified. 2. Small skull metastases not excluded. 3. Moderate to severe chronic small vessel  ischemic disease.    11/24/2018 -  Radiation Therapy    RT was postponed to 11/24/18. Only concurrent with chemo for 2 cycles.       CURRENT THERAPY:  -Carboplatin and etoposideevery3weeks for 4 cycles starting 09/18/18. Held afterC2D1due to COVID-19(+). She restarted with cycle3on 11/03/18.Plan to complete 5 cycles on 12/17/18.  -RTstarted7/15/20  INTERVAL HISTORY:  Stephanie Combs is here for a follow up. She presents to the clinic alone.  She has received 2 weeks of radiation, reports more fatigue, and noticed more abdominal swelling this week. She has mild dysphagia and odynphagia in mid chest lately, able to tolerate regular food well.  No fever or bleeding.  The small wound in her buttock is slowly improving, her daughter change the dressing daily.  REVIEW OF SYSTEMS:   Constitutional: Denies fevers, chills or abnormal weight loss, (+) fatigue  Eyes: Denies blurriness of vision Ears, nose, mouth, throat, and face: Denies mucositis or sore throat Respiratory: Denies cough, mild dyspnea is stable  Cardiovascular: Denies palpitation, chest discomfort or lower extremity swelling Gastrointestinal:  Denies nausea, heartburn or change in bowel habits Skin: Denies abnormal skin rashes, (+) bilateral lower extremity edema, skin erythema and hyperpigmentation Neurological:Denies numbness, tingling or new weaknesses Behavioral/Psych: Mood is stable, no new changes  All other systems were reviewed with the patient and are negative.  MEDICAL HISTORY:  Past Medical History:  Diagnosis Date  . Anxiety   . CAD (coronary artery disease)    a. s/p DES to LAD and angioplasty to D1 in 05/2017  . CHF (congestive heart failure) (H[facility])    Diastolic  . Chronic back pain   . Chronic pain   . COPD (chronic obstructive pulmonary disease) ([place])   . Degenerative disk disease   . Diabetes mellitus without complication ([place])   . History of medication noncompliance 11/2017   "the patient  frequently self-adjusts medications  without notifying her physicians"  . Hypertension   . Hypothyroidism   . On home O2    2.5 L N/C prn  . Pedal edema     SURGICAL HISTORY: Past Surgical History:  Procedure Laterality Date  . BIOPSY  09/09/2018   Procedure: BIOPSY;  Surgeon: Daneil Dolin, MD;  [facility];  Service: Endoscopy;;  gastric esophageal hepatic flexure colon  . COLONOSCOPY WITH PROPOFOL N/A 09/09/2018   Procedure: COLONOSCOPY WITH PROPOFOL;  Surgeon: Daneil Dolin, MD;  [facility];  Service: Endoscopy;  Laterality: N/A;  . CORONARY STENT INTERVENTION N/A 05/22/2017   Procedure: CORONARY STENT INTERVENTION;  Surgeon: Martinique, Peter M, MD;  Location: [place] [facility];  Service: Cardiovascular;  Laterality: N/A;  . ESOPHAGOGASTRODUODENOSCOPY (EGD) WITH PROPOFOL N/A 09/09/2018   Procedure: ESOPHAGOGASTRODUODENOSCOPY (EGD) WITH PROPOFOL;  Surgeon: Daneil Dolin, MD;  [facility];  Service: Endoscopy;  Laterality: N/A;  . HERNIA REPAIR    . POLYPECTOMY  09/09/2018   Procedure: POLYPECTOMY;  Surgeon: Daneil Dolin, MD;  [facility];  Service: Endoscopy;;  colon  . RIGHT/LEFT HEART CATH AND CORONARY ANGIOGRAPHY N/A 05/19/2017   Procedure: RIGHT/LEFT HEART CATH AND CORONARY ANGIOGRAPHY;  Surgeon: Leonie Man, MD;  Location: [place] [facility];  Service: Cardiovascular;  Laterality: N/A;  . TUBAL LIGATION    . VIDEO BRONCHOSCOPY WITH ENDOBRONCHIAL ULTRASOUND Left 09/14/2018   Procedure: VIDEO BRONCHOSCOPY WITH ENDOBRONCHIAL ULTRASOUND with biopsies;  Surgeon: Margaretha Seeds, MD;  Location: [place];  Service: Thoracic;  Laterality: Left;    I have reviewed the social history and family history with the patient and they are unchanged from previous note.  ALLERGIES:  is allergic to dilaudid [hydromorphone hcl]; midodrine hcl; actifed cold-allergy [chlorpheniramine-phenylephrine]; doxycycline; other; penicillins; reglan  [metoclopramide]; valium [diazepam]; and vistaril [hydroxyzine hcl].  MEDICATIONS:  Current Outpatient Medications  Medication Sig Dispense Refill  . albuterol (PROAIR HFA) 108 (90 Base) MCG/ACT inhaler Inhale 2 puffs into the lungs every 4 (four) hours as needed for wheezing or shortness of breath. For shortness of breath 18 g 0  . albuterol (PROVENTIL) (2.5 MG/3ML) 0.083% nebulizer solution Take 2.5 mg by nebulization 2 (two) times daily as needed for wheezing or shortness of breath.     . ALPRAZolam (XANAX) 1 MG tablet Take 1 mg by mouth 3 (three) times daily as needed for anxiety or sleep.     Marland Kitchen aspirin EC 81 MG tablet Take 81 mg by mouth daily.    Marland Kitchen atorvastatin (LIPITOR) 80 MG tablet Take 1 tablet (80 mg total) by mouth daily at 6 PM. 30 tablet 0  . cholecalciferol (VITAMIN D3) 25 MCG (1000 UT) tablet Take 5,000 Units by mouth daily.    . furosemide (LASIX) 40 MG tablet Take 60 mg (1 1/2 tabs) in the morning and 40 mg (1 tab) in the afternoon 75 tablet 6  . gabapentin (NEURONTIN) 600 MG tablet Take 600 mg by mouth 3 (three) times daily.     . Hydrocortisone (GERHARDT'S BUTT CREAM) CREA Apply 1 application topically 3 (three) times daily. (Patient taking differently: Apply 1 application topically 4 (four) times daily as needed for irritation. ) 90 each 0  . insulin aspart (NOVOLOG) 100 UNIT/ML injection Inject 1-18 Units into the skin 3 (three) times daily with meals. (Patient taking differently: Inject 0-20 Units into the skin 4 (four) times daily -  before meals and at bedtime. )    .  insulin detemir (LEVEMIR) 100 UNIT/ML injection Inject 0.4 mLs (40 Units total) into the skin at bedtime. 10 mL 0  . levothyroxine (SYNTHROID, LEVOTHROID) 200 MCG tablet Take 1 tablet by mouth daily before breakfast.     . nitroGLYCERIN (NITROSTAT) 0.4 MG SL tablet Place 0.4 mg under the tongue every 5 (five) minutes as needed for chest pain.     Marland Kitchen nystatin (MYCOSTATIN/NYSTOP) powder Apply topically 2 (two)  times daily. (Patient taking differently: Apply 1 g topically 2 (two) times daily. ) 15 g 0  . Omega-3 Fatty Acids (FISH OIL PO) Take 1 capsule by mouth daily.     . ondansetron (ZOFRAN) 8 MG tablet Take 1 tablet (8 mg total) by mouth every 8 (eight) hours as needed for nausea or vomiting. 120 tablet 0  . oxyCODONE-acetaminophen (PERCOCET) 10-325 MG tablet Take 1 tablet by mouth every 6 (six) hours as needed for pain.    . OXYGEN Inhale 2 L into the lungs continuous.    . pantoprazole (PROTONIX) 40 MG tablet Take 1 tablet (40 mg total) by mouth 2 (two) times daily. 180 tablet 3  . polyethylene glycol (MIRALAX / GLYCOLAX) 17 g packet Take 17 g by mouth 2 (two) times daily. 14 each 0  . potassium chloride (MICRO-K) 10 MEQ CR capsule Take 2 capsules (20 mEq total) by mouth 2 (two) times daily. *May take one additional tablet as needed for cramping 120 capsule 3  . prochlorperazine (COMPAZINE) 10 MG tablet Take 1 tablet (10 mg total) by mouth every 6 (six) hours as needed for nausea or vomiting. 30 tablet 0  . promethazine (PHENERGAN) 12.5 MG tablet Take 1 tablet (12.5 mg total) by mouth every 6 (six) hours as needed for nausea or vomiting. 45 tablet 1  . senna (SENOKOT) 8.6 MG TABS tablet Take 2 tablets (17.2 mg total) by mouth 2 (two) times daily with a meal. 120 each 0  . sucralfate (CARAFATE) 1 g tablet Take 1 tablet (1 g total) by mouth 4 (four) times daily -  with meals and at bedtime. 5 min before meals for radiation induced esophagitis 120 tablet 2  . SURE COMFORT INS SYR 1CC/28G 28G X 1/2" 1 ML MISC USE TO INJECT INSULIN UP TO 4 TIMES DAILY. 150 each 5  . tiZANidine (ZANAFLEX) 4 MG tablet Take 1 tablet (4 mg total) by mouth every 8 (eight) hours as needed for muscle spasms. 30 tablet 0  . topiramate (TOPAMAX) 25 MG tablet Take 75 mg by mouth at bedtime.     Marland Kitchen umeclidinium-vilanterol (ANORO ELLIPTA) 62.5-25 MCG/INH AEPB Inhale 1 puff into the lungs daily. 30 each 0  . allopurinol (ZYLOPRIM) 300  MG tablet Take 1 tablet (300 mg total) by mouth daily for 15 days. 30 tablet 0   No current facility-administered medications for this visit.     PHYSICAL EXAMINATION: ECOG PERFORMANCE STATUS: 2 - Symptomatic, <50% confined to bed  Vitals:   12/08/18 1223  BP: (!) 155/76  Pulse: 88  Resp: 17  Temp: 98 F (36.7 C)  SpO2: 99%   Filed Weights   12/08/18 1223  Weight: 287 lb 4.8 oz (130.3 kg)    GENERAL:alert, no distress and comfortable, severely obese Caucasian female, in wheelchair SKIN: skin color, texture, turgor are normal, no rashes or significant lesions EYES: normal, Conjunctiva are pink and non-injected, sclera clear NECK: supple, thyroid normal size, non-tender, without nodularity LYMPH:  no palpable lymphadenopathy in the cervical, axillary  LUNGS: clear to auscultation  and percussion with normal breathing effort HEART: regular rate & rhythm and no murmurs and no lower extremity edema ABDOMEN:abdomen soft, non-tender and normal bowel sounds, (+) the left low abdominal excessive soft tissue is firm and edematous  musculoskeletal:no cyanosis of digits and no clubbing, (+) low leg edema, mild skin erythema and hyperpigmentation NEURO: alert & oriented x 3 with fluent speech, no focal motor/sensory deficits  LABORATORY DATA:  I have reviewed the data as listed CBC Latest Ref Rng & Units 12/08/2018 12/01/2018 11/24/2018  WBC 4.0 - 10.5 K/uL 2.1(L) 4.4 6.4  Hemoglobin 12.0 - 15.0 g/dL 7.0(L) 8.2(L) 8.5(L)  Hematocrit 36.0 - 46.0 % 22.0(L) 26.0(L) 27.4(L)  Platelets 150 - 400 K/uL 80(L) 221 334     CMP Latest Ref Rng & Units 12/08/2018 12/01/2018 11/24/2018  Glucose 70 - 99 mg/dL 179(H) 124(H) 174(H)  BUN 8 - 23 mg/dL 29(H) 35(H) 24(H)  Creatinine 0.44 - 1.00 mg/dL 2.02(H) 1.23(H) 1.64(H)  Sodium 135 - 145 mmol/L 139 142 142  Potassium 3.5 - 5.1 mmol/L 3.6 4.6 3.9  Chloride 98 - 111 mmol/L 104 107 106  CO2 22 - 32 mmol/L '27 26 25  ' Calcium 8.9 - 10.3 mg/dL 8.6(L) 8.9  8.4(L)  Total Protein 6.5 - 8.1 g/dL 6.1(L) 6.2(L) 6.4(L)  Total Bilirubin 0.3 - 1.2 mg/dL 0.3 0.4 0.3  Alkaline Phos 38 - 126 U/L 96 96 107  AST 15 - 41 U/L 13(L) 12(L) 14(L)  ALT 0 - 44 U/L '8 7 6      ' RADIOGRAPHIC STUDIES: I have personally reviewed the radiological images as listed and agreed with the findings in the report. No results found.   ASSESSMENT & PLAN:  Stephanie Combs is a 68 y.o. female with   1. Small Cell Lung cancer, LUL, cT3N2M0, stage III, limited stage  -Newly diagnosedin 5/2020during her work up for colon cancer -Ipreviouslydiscussed her lung cancer would take priority in terms of treatment given theaggressivenessof small cell lung cancer, and very high risk of recurrence evenwith curative intentconcurrentchemoradiation.  -She started first cycle chemo in hospital, second cycle only received day 1 treatment dye to COVID(+), she tolerated cycle 3 with dose reduction very well.  -She started RT and cycle 4 chemo on 11/24/18. Tolerating well so far.  -She has developed more fatigue and signs of radiation esophagitis -lab reviewed, pancytopenia, will give 1 unit of blood transfusion today, and additional 1 unit in 2 days -Repeat lab in 2 days to monitor her thrombocytopenia and neutropenia  -last cycle chemo next week    2. 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  -Unfortunately she is not a candidate for colon cancer surgery due to very high risk ofsurgical mortality.She was seen by surgery during her recent hospitalization.  -I discussed the incurablenature of her colon cancer without surgery. -Willcontinueto monitor on current treatment for her SCLC. -[facility], will consider low intensity chemo such as Xeloda or Irinotecan (which works for [employer] also). Given normal MMR, she is not a candidate  for immunotherapy. Given primary right side colon cancer, not much benefit from EGFR inhibitors even if KRAS/NRAS/BRAF wild type.  3. 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 -anemia slightly worse due to chemo -Continue monitoring -hg 7.0 today (12/07/18), blood transfusion 1u today and 1 more unit in 2 days, with iv  lasix 6m   4. CAD, CHF, HTN, significant LE edema, CKD -OnLipitor, lasix, nitro -Continue to f/u with cardiologist. -Her CHFand fluid overload has muchimproved during her recent hospitalization. -She has been on 426mLasix in the morning.She responded well to iv lasixwhich I give during his chemo  -I will hold her oral lasix and give iv lasix 2029maily during chemoand blood transfusion   5.DM,hypothyroidism, morbid obesity -On insulin novolog and long acting levemir. OnLevothyroxine. -Willmonitor glucose closely during her chemo treatment -stable   6.Rheumatoidarthritis, chronic back pain  -On Percocet which she takes 4 times a day.  -She is also hasGabapentin.  -She use to receiveinjectionfor her back -Pain chronic, stable. She will continue to see her RA and pain specialist.   7. Depression and anxiety -She has been more depressed with recent diagnosis of 2 cancers -She is currently on Celebrex, will continue.  -stable .   8.Goal of care discussion, DNR/DNI -We previously discussed the incurable nature of her cancer, and the overall poor prognosis, especially ifshe does not have good response to chemotherapy or progress on chemo -The patient understands the goal of care is palliative. -I recommend DNR/DNI,she has agreed to DRI/DNR.She has written will and has established her daughter as her POA.  9. history of COVID-19 -She was incidentally found to be positive for COVID-19on 10/11/2018, no respiratory symptoms or  evidence of pneumonia,she hasGIsymptoms which could be related -Her testing on 11/04/18 was positive again. -no COVID symptoms    Plan: -lab reviewed, stable except worsening pancytopenia  -1u blood today and 1 more unit in 2 days with iv lasix 30m61mContinue RT -Lab and f/u in one week before chemo  -lab in 2 days    No problem-specific Assessment & Plan notes found for this encounter.   No orders of the defined types were placed in this encounter.  All questions were answered. The patient knows to call the clinic with any problems, questions or concerns. No barriers to learning was detected. I spent 15 minutes counseling the patient face to face. The total time spent in the appointment was 20 minutes and more than 50% was on counseling and review of test results     Mystique Bjelland Stephanie Combs 12/08/2018   I, AmoyJoslyn Devon acting as scribe for Judyann Casasola Stephanie Combs.   I have reviewed the above documentation for accuracy and completeness, and I agree with the above.

## 2018-12-07 ENCOUNTER — Ambulatory Visit: Payer: Medicare Other

## 2018-12-07 ENCOUNTER — Ambulatory Visit
Admission: RE | Admit: 2018-12-07 | Discharge: 2018-12-07 | Disposition: A | Discharge status: Home / Self Care | Payer: Medicare Other | Source: Ambulatory Visit | Attending: Radiation Oncology | Admitting: Radiation Oncology

## 2018-12-07 ENCOUNTER — Other Ambulatory Visit: Payer: Self-pay

## 2018-12-07 DIAGNOSIS — I2510 Atherosclerotic heart disease of native coronary artery without angina pectoris: Secondary | ICD-10-CM | POA: Diagnosis not present

## 2018-12-07 DIAGNOSIS — I110 Hypertensive heart disease with heart failure: Secondary | ICD-10-CM | POA: Diagnosis not present

## 2018-12-07 DIAGNOSIS — C3490 Malignant neoplasm of unspecified part of unspecified bronchus or lung: Secondary | ICD-10-CM | POA: Diagnosis not present

## 2018-12-07 DIAGNOSIS — E119 Type 2 diabetes mellitus without complications: Secondary | ICD-10-CM | POA: Diagnosis not present

## 2018-12-07 DIAGNOSIS — I509 Heart failure, unspecified: Secondary | ICD-10-CM | POA: Diagnosis not present

## 2018-12-07 DIAGNOSIS — E039 Hypothyroidism, unspecified: Secondary | ICD-10-CM | POA: Diagnosis not present

## 2018-12-07 DIAGNOSIS — Z510 Encounter for antineoplastic radiation therapy: Secondary | ICD-10-CM | POA: Diagnosis not present

## 2018-12-07 DIAGNOSIS — C3412 Malignant neoplasm of upper lobe, left bronchus or lung: Secondary | ICD-10-CM | POA: Diagnosis not present

## 2018-12-07 DIAGNOSIS — D509 Iron deficiency anemia, unspecified: Secondary | ICD-10-CM | POA: Diagnosis not present

## 2018-12-07 DIAGNOSIS — C19 Malignant neoplasm of rectosigmoid junction: Secondary | ICD-10-CM | POA: Diagnosis not present

## 2018-12-08 ENCOUNTER — Ambulatory Visit: Payer: Medicare Other

## 2018-12-08 ENCOUNTER — Telehealth: Payer: Self-pay | Admitting: Hematology

## 2018-12-08 ENCOUNTER — Other Ambulatory Visit: Payer: Self-pay | Admitting: Emergency Medicine

## 2018-12-08 ENCOUNTER — Inpatient Hospital Stay: Payer: Medicare Other

## 2018-12-08 ENCOUNTER — Inpatient Hospital Stay (HOSPITAL_BASED_OUTPATIENT_CLINIC_OR_DEPARTMENT_OTHER): Payer: Medicare Other | Admitting: Hematology

## 2018-12-08 ENCOUNTER — Ambulatory Visit
Admission: RE | Admit: 2018-12-08 | Discharge: 2018-12-08 | Disposition: A | Discharge status: Home / Self Care | Payer: Medicare Other | Source: Ambulatory Visit | Attending: Radiation Oncology | Admitting: Radiation Oncology

## 2018-12-08 ENCOUNTER — Other Ambulatory Visit: Payer: Self-pay

## 2018-12-08 ENCOUNTER — Encounter: Payer: Self-pay | Admitting: Hematology

## 2018-12-08 VITALS — BP 155/76 | HR 88 | Temp 98.0°F | Resp 17 | Ht 61.5 in | Wt 287.3 lb

## 2018-12-08 DIAGNOSIS — D61818 Other pancytopenia: Secondary | ICD-10-CM | POA: Diagnosis not present

## 2018-12-08 DIAGNOSIS — C3490 Malignant neoplasm of unspecified part of unspecified bronchus or lung: Secondary | ICD-10-CM | POA: Diagnosis not present

## 2018-12-08 DIAGNOSIS — Z5111 Encounter for antineoplastic chemotherapy: Secondary | ICD-10-CM

## 2018-12-08 DIAGNOSIS — Z794 Long term (current) use of insulin: Secondary | ICD-10-CM | POA: Diagnosis not present

## 2018-12-08 DIAGNOSIS — I5032 Chronic diastolic (congestive) heart failure: Secondary | ICD-10-CM

## 2018-12-08 DIAGNOSIS — I509 Heart failure, unspecified: Secondary | ICD-10-CM | POA: Diagnosis not present

## 2018-12-08 DIAGNOSIS — N183 Chronic kidney disease, stage 3 unspecified: Secondary | ICD-10-CM

## 2018-12-08 DIAGNOSIS — C183 Malignant neoplasm of hepatic flexure: Secondary | ICD-10-CM

## 2018-12-08 DIAGNOSIS — D3502 Benign neoplasm of left adrenal gland: Secondary | ICD-10-CM | POA: Diagnosis not present

## 2018-12-08 DIAGNOSIS — F329 Major depressive disorder, single episode, unspecified: Secondary | ICD-10-CM

## 2018-12-08 DIAGNOSIS — D649 Anemia, unspecified: Secondary | ICD-10-CM

## 2018-12-08 DIAGNOSIS — I700 Atherosclerosis of aorta: Secondary | ICD-10-CM | POA: Diagnosis not present

## 2018-12-08 DIAGNOSIS — K449 Diaphragmatic hernia without obstruction or gangrene: Secondary | ICD-10-CM

## 2018-12-08 DIAGNOSIS — E1122 Type 2 diabetes mellitus with diabetic chronic kidney disease: Secondary | ICD-10-CM

## 2018-12-08 DIAGNOSIS — E039 Hypothyroidism, unspecified: Secondary | ICD-10-CM | POA: Diagnosis not present

## 2018-12-08 DIAGNOSIS — I2510 Atherosclerotic heart disease of native coronary artery without angina pectoris: Secondary | ICD-10-CM

## 2018-12-08 DIAGNOSIS — I129 Hypertensive chronic kidney disease with stage 1 through stage 4 chronic kidney disease, or unspecified chronic kidney disease: Secondary | ICD-10-CM | POA: Diagnosis not present

## 2018-12-08 DIAGNOSIS — N189 Chronic kidney disease, unspecified: Secondary | ICD-10-CM

## 2018-12-08 DIAGNOSIS — T451X5A Adverse effect of antineoplastic and immunosuppressive drugs, initial encounter: Secondary | ICD-10-CM | POA: Diagnosis not present

## 2018-12-08 DIAGNOSIS — M069 Rheumatoid arthritis, unspecified: Secondary | ICD-10-CM

## 2018-12-08 DIAGNOSIS — C3412 Malignant neoplasm of upper lobe, left bronchus or lung: Secondary | ICD-10-CM

## 2018-12-08 DIAGNOSIS — Z79899 Other long term (current) drug therapy: Secondary | ICD-10-CM

## 2018-12-08 DIAGNOSIS — G8929 Other chronic pain: Secondary | ICD-10-CM

## 2018-12-08 DIAGNOSIS — Z7982 Long term (current) use of aspirin: Secondary | ICD-10-CM

## 2018-12-08 DIAGNOSIS — R590 Localized enlarged lymph nodes: Secondary | ICD-10-CM

## 2018-12-08 DIAGNOSIS — J90 Pleural effusion, not elsewhere classified: Secondary | ICD-10-CM | POA: Diagnosis not present

## 2018-12-08 DIAGNOSIS — Z510 Encounter for antineoplastic radiation therapy: Secondary | ICD-10-CM | POA: Diagnosis not present

## 2018-12-08 DIAGNOSIS — I110 Hypertensive heart disease with heart failure: Secondary | ICD-10-CM | POA: Diagnosis not present

## 2018-12-08 DIAGNOSIS — M7989 Other specified soft tissue disorders: Secondary | ICD-10-CM

## 2018-12-08 DIAGNOSIS — Z8619 Personal history of other infectious and parasitic diseases: Secondary | ICD-10-CM

## 2018-12-08 DIAGNOSIS — D6481 Anemia due to antineoplastic chemotherapy: Secondary | ICD-10-CM | POA: Diagnosis not present

## 2018-12-08 DIAGNOSIS — I10 Essential (primary) hypertension: Secondary | ICD-10-CM

## 2018-12-08 DIAGNOSIS — D509 Iron deficiency anemia, unspecified: Secondary | ICD-10-CM | POA: Diagnosis not present

## 2018-12-08 DIAGNOSIS — E669 Obesity, unspecified: Secondary | ICD-10-CM

## 2018-12-08 DIAGNOSIS — C19 Malignant neoplasm of rectosigmoid junction: Secondary | ICD-10-CM | POA: Diagnosis not present

## 2018-12-08 DIAGNOSIS — J449 Chronic obstructive pulmonary disease, unspecified: Secondary | ICD-10-CM

## 2018-12-08 DIAGNOSIS — E119 Type 2 diabetes mellitus without complications: Secondary | ICD-10-CM | POA: Diagnosis not present

## 2018-12-08 LAB — CMP (CANCER CENTER ONLY)
ALT: 8 U/L (ref 0–44)
AST: 13 U/L — ABNORMAL LOW (ref 15–41)
Albumin: 3 g/dL — ABNORMAL LOW (ref 3.5–5.0)
Alkaline Phosphatase: 96 U/L (ref 38–126)
Anion gap: 8 (ref 5–15)
BUN: 29 mg/dL — ABNORMAL HIGH (ref 8–23)
CO2: 27 mmol/L (ref 22–32)
Calcium: 8.6 mg/dL — ABNORMAL LOW (ref 8.9–10.3)
Chloride: 104 mmol/L (ref 98–111)
Creatinine: 2.02 mg/dL — ABNORMAL HIGH (ref 0.44–1.00)
GFR, Est AFR Am: 29 mL/min — ABNORMAL LOW (ref >60)
GFR, Estimated: 25 mL/min — ABNORMAL LOW (ref >60)
Glucose, Bld: 179 mg/dL — ABNORMAL HIGH (ref 70–99)
Potassium: 3.6 mmol/L (ref 3.5–5.1)
Sodium: 139 mmol/L (ref 135–145)
Total Bilirubin: 0.3 mg/dL (ref 0.3–1.2)
Total Protein: 6.1 g/dL — ABNORMAL LOW (ref 6.5–8.1)

## 2018-12-08 LAB — CBC WITH DIFFERENTIAL (CANCER CENTER ONLY)
Abs Immature Granulocytes: 0.01 10*3/uL (ref 0.00–0.07)
Basophils Absolute: 0 10*3/uL (ref 0.0–0.1)
Basophils Relative: 1 %
Eosinophils Absolute: 0.1 10*3/uL (ref 0.0–0.5)
Eosinophils Relative: 3 %
HCT: 22 % — ABNORMAL LOW (ref 36.0–46.0)
Hemoglobin: 7 g/dL — ABNORMAL LOW (ref 12.0–15.0)
Immature Granulocytes: 1 %
Lymphocytes Relative: 43 %
Lymphs Abs: 0.9 10*3/uL (ref 0.7–4.0)
MCH: 32 pg (ref 26.0–34.0)
MCHC: 31.8 g/dL (ref 30.0–36.0)
MCV: 100.5 fL — ABNORMAL HIGH (ref 80.0–100.0)
Monocytes Absolute: 0.3 10*3/uL (ref 0.1–1.0)
Monocytes Relative: 15 %
Neutro Abs: 0.8 10*3/uL — ABNORMAL LOW (ref 1.7–7.7)
Neutrophils Relative %: 37 %
Platelet Count: 80 10*3/uL — ABNORMAL LOW (ref 150–400)
RBC: 2.19 MIL/uL — ABNORMAL LOW (ref 3.87–5.11)
RDW: 18.8 % — ABNORMAL HIGH (ref 11.5–15.5)
WBC Count: 2.1 10*3/uL — ABNORMAL LOW (ref 4.0–10.5)
nRBC: 0 % (ref 0.0–0.2)

## 2018-12-08 LAB — SAMPLE TO BLOOD BANK

## 2018-12-08 LAB — PREPARE RBC (CROSSMATCH)

## 2018-12-08 MED ORDER — HEPARIN SOD (PORK) LOCK FLUSH 100 UNIT/ML IV SOLN
500.0000 [IU] | Freq: Every day | INTRAVENOUS | Status: DC | PRN
  Filled 2018-12-08: qty 5

## 2018-12-08 MED ORDER — SODIUM CHLORIDE 0.9% FLUSH
10.0000 mL | INTRAVENOUS | Status: DC | PRN
  Filled 2018-12-08: qty 10

## 2018-12-08 MED ORDER — FUROSEMIDE 10 MG/ML IJ SOLN
INTRAMUSCULAR | Status: AC
  Filled 2018-12-08: qty 2

## 2018-12-08 MED ORDER — DIPHENHYDRAMINE HCL 25 MG PO CAPS
ORAL_CAPSULE | ORAL | Status: AC
  Filled 2018-12-08: qty 1

## 2018-12-08 MED ORDER — SODIUM CHLORIDE 0.9% IV SOLUTION
250.0000 mL | Freq: Once | INTRAVENOUS | Status: AC
  Administered 2018-12-08: 250 mL via INTRAVENOUS
  Filled 2018-12-08: qty 250

## 2018-12-08 MED ORDER — HEPARIN SOD (PORK) LOCK FLUSH 100 UNIT/ML IV SOLN
250.0000 [IU] | INTRAVENOUS | Status: DC | PRN
  Filled 2018-12-08: qty 5

## 2018-12-08 MED ORDER — DIPHENHYDRAMINE HCL 25 MG PO CAPS
25.0000 mg | ORAL_CAPSULE | Freq: Once | ORAL | Status: AC
  Administered 2018-12-08: 25 mg via ORAL

## 2018-12-08 MED ORDER — ACETAMINOPHEN 325 MG PO TABS
650.0000 mg | ORAL_TABLET | Freq: Once | ORAL | Status: AC
  Administered 2018-12-08: 650 mg via ORAL

## 2018-12-08 MED ORDER — FUROSEMIDE 10 MG/ML IJ SOLN
20.0000 mg | Freq: Once | INTRAMUSCULAR | Status: AC
  Administered 2018-12-08: 20 mg via INTRAVENOUS

## 2018-12-08 MED ORDER — ACETAMINOPHEN 325 MG PO TABS
ORAL_TABLET | ORAL | Status: AC
  Filled 2018-12-08: qty 2

## 2018-12-08 MED ORDER — SODIUM CHLORIDE 0.9% FLUSH
3.0000 mL | INTRAVENOUS | Status: DC | PRN
  Filled 2018-12-08: qty 10

## 2018-12-08 NOTE — Patient Instructions (Signed)
Blood Transfusion, Adult, Care After This sheet gives you information about how to care for yourself after your procedure. Your doctor may also give you more specific instructions. If you have problems or questions, contact your doctor. Follow these instructions at home:   Take over-the-counter and prescription medicines only as told by your doctor.  Go back to your normal activities as told by your doctor.  Follow instructions from your doctor about how to take care of the area where an IV tube was put into your vein (insertion site). Make sure you: ? Wash your hands with soap and water before you change your bandage (dressing). If there is no soap and water, use hand sanitizer. ? Change your bandage as told by your doctor.  Check your IV insertion site every day for signs of infection. Check for: ? More redness, swelling, or pain. ? More fluid or blood. ? Warmth. ? Pus or a bad smell. Contact a doctor if:  You have more redness, swelling, or pain around the IV insertion site.  You have more fluid or blood coming from the IV insertion site.  Your IV insertion site feels warm to the touch.  You have pus or a bad smell coming from the IV insertion site.  Your pee (urine) turns pink, red, or brown.  You feel weak after doing your normal activities. Get help right away if:  You have signs of a serious allergic or body defense (immune) system reaction, including: ? Itchiness. ? Hives. ? Trouble breathing. ? Anxiety. ? Pain in your chest or lower back. ? Fever, flushing, and chills. ? Fast pulse. ? Rash. ? Watery poop (diarrhea). ? Throwing up (vomiting). ? Dark pee. ? Serious headache. ? Dizziness. ? Stiff neck. ? Yellow color in your face or the white parts of your eyes (jaundice). Summary  After a blood transfusion, return to your normal activities as told by your doctor.  Every day, check for signs of infection where the IV tube was put into your vein.  Some  signs of infection are warm skin, more redness and pain, more fluid or blood, and pus or a bad smell where the needle went in.  Contact your doctor if you feel weak or have any unusual symptoms. This information is not intended to replace advice given to you by your health care provider. Make sure you discuss any questions you have with your health care provider. Document Released: 05/19/2014 Document Revised: 09/02/2017 Document Reviewed: 12/21/2015 Elsevier Patient Education  2020 [employer]  

## 2018-12-08 NOTE — Progress Notes (Signed)
Pt received 1 unit PRBCs today, tolerated well.  Ate and drank during transfusion without any issues.  Reports feeling better at end of infusion.  VSS.  Given IV Lasix afterwards d/t hx of CHF.  Pt aware of date/time of next transfusion appt on 12/10/2018.  Pt verbalized understanding of d/c instructions including signs of transfusion reaction and to f/u as needed with any questions/concerns.  Pt aware to keep blue blood bracelet on for next transfusion.

## 2018-12-08 NOTE — Telephone Encounter (Signed)
Scheduled appt per 7/29 los.  Printed calendar and avs and took it to the patient while in treatment.

## 2018-12-09 ENCOUNTER — Other Ambulatory Visit: Payer: Self-pay

## 2018-12-09 ENCOUNTER — Telehealth: Payer: Self-pay

## 2018-12-09 ENCOUNTER — Ambulatory Visit: Payer: Medicare Other

## 2018-12-09 ENCOUNTER — Ambulatory Visit
Admission: RE | Admit: 2018-12-09 | Discharge: 2018-12-09 | Disposition: A | Discharge status: Home / Self Care | Payer: Medicare Other | Source: Ambulatory Visit | Attending: Radiation Oncology | Admitting: Radiation Oncology

## 2018-12-09 DIAGNOSIS — I509 Heart failure, unspecified: Secondary | ICD-10-CM | POA: Diagnosis not present

## 2018-12-09 DIAGNOSIS — Z510 Encounter for antineoplastic radiation therapy: Secondary | ICD-10-CM | POA: Diagnosis not present

## 2018-12-09 DIAGNOSIS — I2510 Atherosclerotic heart disease of native coronary artery without angina pectoris: Secondary | ICD-10-CM | POA: Diagnosis not present

## 2018-12-09 DIAGNOSIS — C3412 Malignant neoplasm of upper lobe, left bronchus or lung: Secondary | ICD-10-CM | POA: Diagnosis not present

## 2018-12-09 DIAGNOSIS — E119 Type 2 diabetes mellitus without complications: Secondary | ICD-10-CM | POA: Diagnosis not present

## 2018-12-09 DIAGNOSIS — C3490 Malignant neoplasm of unspecified part of unspecified bronchus or lung: Secondary | ICD-10-CM | POA: Diagnosis not present

## 2018-12-09 DIAGNOSIS — D509 Iron deficiency anemia, unspecified: Secondary | ICD-10-CM | POA: Diagnosis not present

## 2018-12-09 DIAGNOSIS — E039 Hypothyroidism, unspecified: Secondary | ICD-10-CM | POA: Diagnosis not present

## 2018-12-09 DIAGNOSIS — C19 Malignant neoplasm of rectosigmoid junction: Secondary | ICD-10-CM | POA: Diagnosis not present

## 2018-12-09 DIAGNOSIS — I110 Hypertensive heart disease with heart failure: Secondary | ICD-10-CM | POA: Diagnosis not present

## 2018-12-09 NOTE — Telephone Encounter (Signed)
This RN was called to lobby to speak to the patient.  She complains of weakness and nausea and wanted suggestions on what to eat.  I instructed the patient to take her Zofran for nausea and eat soups, broths, whatever she can keep down.  She verbalized an understanding.  She was supplied a warm blanket and I assisted her into the vehicle driven by her friend Coralyn Mark.

## 2018-12-09 NOTE — Telephone Encounter (Signed)
Faxed script for rollator walker large size to [place] per patient request.

## 2018-12-10 ENCOUNTER — Ambulatory Visit: Payer: Medicare Other

## 2018-12-10 ENCOUNTER — Inpatient Hospital Stay: Payer: Medicare Other

## 2018-12-10 ENCOUNTER — Telehealth: Payer: Self-pay | Admitting: Hematology

## 2018-12-10 ENCOUNTER — Ambulatory Visit
Admission: RE | Admit: 2018-12-10 | Discharge: 2018-12-10 | Disposition: A | Discharge status: Home / Self Care | Payer: Medicare Other | Source: Ambulatory Visit | Attending: Radiation Oncology | Admitting: Radiation Oncology

## 2018-12-10 ENCOUNTER — Other Ambulatory Visit: Payer: Self-pay

## 2018-12-10 DIAGNOSIS — D6481 Anemia due to antineoplastic chemotherapy: Secondary | ICD-10-CM | POA: Diagnosis not present

## 2018-12-10 DIAGNOSIS — J90 Pleural effusion, not elsewhere classified: Secondary | ICD-10-CM | POA: Diagnosis not present

## 2018-12-10 DIAGNOSIS — I2510 Atherosclerotic heart disease of native coronary artery without angina pectoris: Secondary | ICD-10-CM | POA: Diagnosis not present

## 2018-12-10 DIAGNOSIS — E1122 Type 2 diabetes mellitus with diabetic chronic kidney disease: Secondary | ICD-10-CM | POA: Diagnosis not present

## 2018-12-10 DIAGNOSIS — C19 Malignant neoplasm of rectosigmoid junction: Secondary | ICD-10-CM | POA: Diagnosis not present

## 2018-12-10 DIAGNOSIS — E039 Hypothyroidism, unspecified: Secondary | ICD-10-CM | POA: Diagnosis not present

## 2018-12-10 DIAGNOSIS — M7989 Other specified soft tissue disorders: Secondary | ICD-10-CM | POA: Diagnosis not present

## 2018-12-10 DIAGNOSIS — Z794 Long term (current) use of insulin: Secondary | ICD-10-CM | POA: Diagnosis not present

## 2018-12-10 DIAGNOSIS — D649 Anemia, unspecified: Secondary | ICD-10-CM

## 2018-12-10 DIAGNOSIS — C3490 Malignant neoplasm of unspecified part of unspecified bronchus or lung: Secondary | ICD-10-CM | POA: Diagnosis not present

## 2018-12-10 DIAGNOSIS — C3412 Malignant neoplasm of upper lobe, left bronchus or lung: Secondary | ICD-10-CM

## 2018-12-10 DIAGNOSIS — I700 Atherosclerosis of aorta: Secondary | ICD-10-CM | POA: Diagnosis not present

## 2018-12-10 DIAGNOSIS — Z5111 Encounter for antineoplastic chemotherapy: Secondary | ICD-10-CM | POA: Diagnosis not present

## 2018-12-10 DIAGNOSIS — I110 Hypertensive heart disease with heart failure: Secondary | ICD-10-CM | POA: Diagnosis not present

## 2018-12-10 DIAGNOSIS — E119 Type 2 diabetes mellitus without complications: Secondary | ICD-10-CM | POA: Diagnosis not present

## 2018-12-10 DIAGNOSIS — D61818 Other pancytopenia: Secondary | ICD-10-CM | POA: Diagnosis not present

## 2018-12-10 DIAGNOSIS — D3502 Benign neoplasm of left adrenal gland: Secondary | ICD-10-CM | POA: Diagnosis not present

## 2018-12-10 DIAGNOSIS — G8929 Other chronic pain: Secondary | ICD-10-CM | POA: Diagnosis not present

## 2018-12-10 DIAGNOSIS — I509 Heart failure, unspecified: Secondary | ICD-10-CM | POA: Diagnosis not present

## 2018-12-10 DIAGNOSIS — I129 Hypertensive chronic kidney disease with stage 1 through stage 4 chronic kidney disease, or unspecified chronic kidney disease: Secondary | ICD-10-CM | POA: Diagnosis not present

## 2018-12-10 DIAGNOSIS — C183 Malignant neoplasm of hepatic flexure: Secondary | ICD-10-CM | POA: Diagnosis not present

## 2018-12-10 DIAGNOSIS — T451X5A Adverse effect of antineoplastic and immunosuppressive drugs, initial encounter: Secondary | ICD-10-CM | POA: Diagnosis not present

## 2018-12-10 DIAGNOSIS — D509 Iron deficiency anemia, unspecified: Secondary | ICD-10-CM | POA: Diagnosis not present

## 2018-12-10 DIAGNOSIS — K449 Diaphragmatic hernia without obstruction or gangrene: Secondary | ICD-10-CM | POA: Diagnosis not present

## 2018-12-10 DIAGNOSIS — Z510 Encounter for antineoplastic radiation therapy: Secondary | ICD-10-CM | POA: Diagnosis not present

## 2018-12-10 DIAGNOSIS — M069 Rheumatoid arthritis, unspecified: Secondary | ICD-10-CM | POA: Diagnosis not present

## 2018-12-10 DIAGNOSIS — R590 Localized enlarged lymph nodes: Secondary | ICD-10-CM | POA: Diagnosis not present

## 2018-12-10 DIAGNOSIS — N189 Chronic kidney disease, unspecified: Secondary | ICD-10-CM | POA: Diagnosis not present

## 2018-12-10 LAB — CBC WITH DIFFERENTIAL (CANCER CENTER ONLY)
Abs Immature Granulocytes: 0.01 10*3/uL (ref 0.00–0.07)
Basophils Absolute: 0 10*3/uL (ref 0.0–0.1)
Basophils Relative: 2 %
Eosinophils Absolute: 0.1 10*3/uL (ref 0.0–0.5)
Eosinophils Relative: 3 %
HCT: 27.9 % — ABNORMAL LOW (ref 36.0–46.0)
Hemoglobin: 9 g/dL — ABNORMAL LOW (ref 12.0–15.0)
Immature Granulocytes: 1 %
Lymphocytes Relative: 38 %
Lymphs Abs: 0.7 10*3/uL (ref 0.7–4.0)
MCH: 32 pg (ref 26.0–34.0)
MCHC: 32.3 g/dL (ref 30.0–36.0)
MCV: 99.3 fL (ref 80.0–100.0)
Monocytes Absolute: 0.3 10*3/uL (ref 0.1–1.0)
Monocytes Relative: 19 %
Neutro Abs: 0.6 10*3/uL — ABNORMAL LOW (ref 1.7–7.7)
Neutrophils Relative %: 37 %
Platelet Count: 116 10*3/uL — ABNORMAL LOW (ref 150–400)
RBC: 2.81 MIL/uL — ABNORMAL LOW (ref 3.87–5.11)
RDW: 18.8 % — ABNORMAL HIGH (ref 11.5–15.5)
WBC Count: 1.7 10*3/uL — ABNORMAL LOW (ref 4.0–10.5)
nRBC: 0 % (ref 0.0–0.2)

## 2018-12-10 LAB — CMP (CANCER CENTER ONLY)
ALT: 11 U/L (ref 0–44)
AST: 13 U/L — ABNORMAL LOW (ref 15–41)
Albumin: 3.3 g/dL — ABNORMAL LOW (ref 3.5–5.0)
Alkaline Phosphatase: 119 U/L (ref 38–126)
Anion gap: 10 (ref 5–15)
BUN: 25 mg/dL — ABNORMAL HIGH (ref 8–23)
CO2: 25 mmol/L (ref 22–32)
Calcium: 9.6 mg/dL (ref 8.9–10.3)
Chloride: 104 mmol/L (ref 98–111)
Creatinine: 1.72 mg/dL — ABNORMAL HIGH (ref 0.44–1.00)
GFR, Est AFR Am: 35 mL/min — ABNORMAL LOW (ref >60)
GFR, Estimated: 30 mL/min — ABNORMAL LOW (ref >60)
Glucose, Bld: 236 mg/dL — ABNORMAL HIGH (ref 70–99)
Potassium: 5.1 mmol/L (ref 3.5–5.1)
Sodium: 139 mmol/L (ref 135–145)
Total Bilirubin: 0.7 mg/dL (ref 0.3–1.2)
Total Protein: 6.8 g/dL (ref 6.5–8.1)

## 2018-12-10 NOTE — Progress Notes (Signed)
Person   [phone] [phone]   Clinic Follow up Note   Patient Care Team: [name], MD as PCP - General (Internal Medicine) Herminio Commons, MD as PCP - Cardiology (Cardiology) Neldon Labella, RN as [facility] Truitt Merle, MD as Consulting Physician (Hematology)  Date of Service:  12/10/2018  CHIEF COMPLAINT: F/u ofright colon cancerand Small cell lung cancer  SUMMARY OF ONCOLOGIC HISTORY: Oncology History Overview Note  Cancer Staging Primary cancer of hepatic flexure of colon [facility]) Staging form: Colon and Rectum, AJCC 8th Edition - Clinical stage from 09/09/2018: Stage Unknown (cTX, cN0, cM0) - Signed by Truitt Merle, MD on 09/28/2018  Small cell lung cancer, left upper lobe ([place]) Staging form: Lung, AJCC 8th Edition - Clinical stage from 09/14/2018: Stage IIIB (cT3, cN2, cM0) - Signed by Truitt Merle, MD on 09/28/2018     Primary cancer of hepatic flexure of colon ([place]) (Resolved)   Initial Diagnosis   Malignant neoplasm of hepatic flexure ([employer])   09/09/2018 Procedure   Coloscopy by Dr Gala Romney 09/09/18  IMPRESSION -Colon mass as described in the vicinity of the hepatic flexure - status post biopsy and inking. Polyps associated with this tumor scattered all the way to the cecum were not removed; multiple splenic flexure and rectal polyps were removed as described above. Left-sided diverticulosis.  Upper endoscopy By Dr Gala Romney 09/09/18  IMPRESSION -Abnormal distal esophagus suspicious for Barrett's esophagus?biopsied. - Small hiatal hernia. - Erosive gastropathy. Biopsied. -Duodenal bulbar erosions   09/09/2018 Initial Biopsy   Diagnosis 09/09/18 1. Stomach, biopsy - GASTRIC ANTRAL MUCOSA WITH MILD NONSPECIFIC REACTIVE GASTROPATHY - GASTRIC OXYNTIC MUCOSA WITH PARIETAL CELL HYPERPLASIA AS CAN BE SEEN IN HYPERGASTRINEMIC STATES SUCH AS PROPER PATIENT IDENTIFICATION THERAPY. - WARTHIN STARRY STAIN IS  NEGATIVE FOR HELICOBACTER PYLORI 2. Esophagus, biopsy - BARRETT'S ESOPHAGUS, NEGATIVE FOR DYSPLASIA 3. Colon, polyp(s), descending - TUBULAR ADENOMA - NEGATIVE FOR HIGH-GRADE DYSPLASIA OR MALIGNANCY 4. Colon, biopsy, hepatic flexure - ADENOCARCINOMA. SEE NOTE 5. Colon, polyp(s), splenic flexure - ADENOCARCINOMA. SEE NOTE - OTHER FRAGMENTS OF TUBULAR ADENOMA(S) WITH ONE SHOWING HIGH-GRADE DYSPLASIA 6. Rectum, polyp(s) - TUBULAR ADENOMA(S) - NEGATIVE FOR HIGH-GRADE DYSPLASIA OR MALIGNANCY   09/09/2018 Cancer Staging   Staging form: Colon and Rectum, AJCC 8th Edition - Clinical stage from 09/09/2018: Stage Unknown (cTX, cN0, cM0) - Signed by Truitt Merle, MD on 09/28/2018   09/11/2018 Imaging   CT CAP 09/11/18  IMPRESSION: 1. Central perihilar left upper lobe 5.4 cm lung mass, new since 01/27/2017 chest CT, compatible with malignancy, favor primary bronchogenic carcinoma. Postobstructive pneumonia/atelectasis in the lingula. 2. Mediastinal adenopathy in the left prevascular/AP window and subcarinal stations, compatible with metastatic nodes (more likely originating from the left upper lobe lung mass). 3. Trace dependent left pleural effusion. 4. Apple-core lesion in the ascending colon, compatible with known primary colonic neoplasm. Adjacent top-normal size right mesenteric lymph node is equivocal for locoregional nodal metastasis. No additional potential findings of metastatic disease in the abdomen or pelvis. 5. Chronic findings include: Aortic Atherosclerosis (ICD10-I70.0). Borderline mild cardiomegaly. One vessel coronary atherosclerosis. Dilated main pulmonary artery, suggesting pulmonary arterial hypertension. Left adrenal adenoma. Punctate bilateral nephrolithiasis. Small hiatal hernia.   09/20/2018 Imaging   Brain MRI 09/20/18  IMPRESSION: 1. Motion degraded examination without brain metastases identified. 2. Small skull metastases not excluded. 3. Moderate to severe chronic  small vessel ischemic disease.   Small cell lung cancer, left upper lobe ([place])  09/11/2018 Imaging   CT  CAP 09/11/18  IMPRESSION: 1. Central perihilar left upper lobe 5.4 cm lung mass, new since 01/27/2017 chest CT, compatible with malignancy, favor primary bronchogenic carcinoma. Postobstructive pneumonia/atelectasis in the lingula. 2. Mediastinal adenopathy in the left prevascular/AP window and subcarinal stations, compatible with metastatic nodes (more likely originating from the left upper lobe lung mass). 3. Trace dependent left pleural effusion. 4. Apple-core lesion in the ascending colon, compatible with known primary colonic neoplasm. Adjacent top-normal size right mesenteric lymph node is equivocal for locoregional nodal metastasis. No additional potential findings of metastatic disease in the abdomen or pelvis. 5. Chronic findings include: Aortic Atherosclerosis (ICD10-I70.0). Borderline mild cardiomegaly. One vessel coronary atherosclerosis. Dilated main pulmonary artery, suggesting pulmonary arterial hypertension. Left adrenal adenoma. Punctate bilateral nephrolithiasis. Small hiatal hernia.    09/14/2018 Initial Biopsy   Diagnosis 5/5.20 Lung, biopsy, LUL - SMALL CELL CARCINOMA.   09/14/2018 Cancer Staging   Staging form: Lung, AJCC 8th Edition - Clinical stage from 09/14/2018: Stage IIIB (cT3, cN2, cM0) - Signed by Truitt Merle, MD on 09/28/2018   09/15/2018 Initial Diagnosis   Small cell lung cancer, left upper lobe ([place])   09/18/2018 -  Chemotherapy   Carboplatin and etoposide every 3 weeks for 4 cycles starting 09/18/18. Held after C2D1 due to COVID-19(+). She restarted with cycle 3 on 11/03/18. Plan to complete 5 cycles on 12/17/18.  RT was concurrnet but started on 11/24/18.   09/20/2018 Imaging   Brain MRI 09/20/18  IMPRESSION: 1. Motion degraded examination without brain metastases identified. 2. Small skull metastases not excluded. 3. Moderate to severe chronic small vessel  ischemic disease.    11/24/2018 -  Radiation Therapy    RT was postponed to 11/24/18. Only concurrent with chemo for 2 cycles.       CURRENT THERAPY:  -Carboplatin and etoposideevery3weeks for 4 cycles starting 09/18/18. Held afterC2D1due to COVID-19(+). She restarted with cycle3on 11/03/18.Plan to complete 5 cycles on 12/17/18.  -RTstarted7/15/20  INTERVAL HISTORY:  Stephanie Combs is here for a follow up and treatment. She presents to the clinic alone.  She complains more fatigue lately, still able to tolerate her radiation.  No significant dysphagia.  No fever or chills.  She has gained some weight lately, her dyspnea is stable.   REVIEW OF SYSTEMS:   Constitutional: Denies fevers, chills or abnormal weight loss, (+) worsening fatigue and weight gain Eyes: Denies blurriness of vision Ears, nose, mouth, throat, and face: Denies mucositis or sore throat Respiratory: (+) Stable dyspnea Cardiovascular: Denies palpitation, chest discomfort or lower extremity swelling Gastrointestinal:  Denies nausea, heartburn or change in bowel habits Skin: Denies abnormal skin rashes Lymphatics: Denies new lymphadenopathy or easy bruising Neurological:Denies numbness, tingling or new weaknesses Behavioral/Psych: Mood is stable, no new changes  All other systems were reviewed with the patient and are negative.  MEDICAL HISTORY:  Past Medical History:  Diagnosis Date  . Anxiety   . CAD (coronary artery disease)    a. s/p DES to LAD and angioplasty to D1 in 05/2017  . CHF (congestive heart failure) ([facility])    Diastolic  . Chronic back pain   . Chronic pain   . COPD (chronic obstructive pulmonary disease) ([place])   . Degenerative disk disease   . Diabetes mellitus without complication ([place])   . History of medication noncompliance 11/2017   "the patient frequently self-adjusts medications without notifying her physicians"  . Hypertension   . Hypothyroidism   . On home O2    2.5 L N/C prn   .  Pedal edema     SURGICAL HISTORY: Past Surgical History:  Procedure Laterality Date  . BIOPSY  09/09/2018   Procedure: BIOPSY;  Surgeon: Daneil Dolin, MD;  [facility];  Service: Endoscopy;;  gastric esophageal hepatic flexure colon  . COLONOSCOPY WITH PROPOFOL N/A 09/09/2018   Procedure: COLONOSCOPY WITH PROPOFOL;  Surgeon: Daneil Dolin, MD;  [facility];  Service: Endoscopy;  Laterality: N/A;  . CORONARY STENT INTERVENTION N/A 05/22/2017   Procedure: CORONARY STENT INTERVENTION;  Surgeon: Martinique, Peter M, MD;  Location: [facility];  Service: Cardiovascular;  Laterality: N/A;  . ESOPHAGOGASTRODUODENOSCOPY (EGD) WITH PROPOFOL N/A 09/09/2018   Procedure: ESOPHAGOGASTRODUODENOSCOPY (EGD) WITH PROPOFOL;  Surgeon: Daneil Dolin, MD;  [facility];  Service: Endoscopy;  Laterality: N/A;  . HERNIA REPAIR    . POLYPECTOMY  09/09/2018   Procedure: POLYPECTOMY;  Surgeon: Daneil Dolin, MD;  [facility];  Service: Endoscopy;;  colon  . RIGHT/LEFT HEART CATH AND CORONARY ANGIOGRAPHY N/A 05/19/2017   Procedure: RIGHT/LEFT HEART CATH AND CORONARY ANGIOGRAPHY;  Surgeon: Leonie Man, MD;  Location: [facility];  Service: Cardiovascular;  Laterality: N/A;  . TUBAL LIGATION    . VIDEO BRONCHOSCOPY WITH ENDOBRONCHIAL ULTRASOUND Left 09/14/2018   Procedure: VIDEO BRONCHOSCOPY WITH ENDOBRONCHIAL ULTRASOUND with biopsies;  Surgeon: Margaretha Seeds, MD;  Location: [facility];  Service: Thoracic;  Laterality: Left;    I have reviewed the social history and family history with the patient and they are unchanged from previous note.  ALLERGIES:  is allergic to dilaudid [hydromorphone hcl]; midodrine hcl; actifed cold-allergy [chlorpheniramine-phenylephrine]; doxycycline; other; penicillins; reglan [metoclopramide]; valium [diazepam]; and vistaril [hydroxyzine hcl].  MEDICATIONS:  Current Outpatient Medications  Medication Sig Dispense Refill   . albuterol (PROAIR HFA) 108 (90 Base) MCG/ACT inhaler Inhale 2 puffs into the lungs every 4 (four) hours as needed for wheezing or shortness of breath. For shortness of breath 18 g 0  . albuterol (PROVENTIL) (2.5 MG/3ML) 0.083% nebulizer solution Take 2.5 mg by nebulization 2 (two) times daily as needed for wheezing or shortness of breath.     . allopurinol (ZYLOPRIM) 300 MG tablet Take 1 tablet (300 mg total) by mouth daily for 15 days. 30 tablet 0  . ALPRAZolam (XANAX) 1 MG tablet Take 1 mg by mouth 3 (three) times daily as needed for anxiety or sleep.     Marland Kitchen aspirin EC 81 MG tablet Take 81 mg by mouth daily.    Marland Kitchen atorvastatin (LIPITOR) 80 MG tablet Take 1 tablet (80 mg total) by mouth daily at 6 PM. 30 tablet 0  . cholecalciferol (VITAMIN D3) 25 MCG (1000 UT) tablet Take 5,000 Units by mouth daily.    . furosemide (LASIX) 40 MG tablet Take 60 mg (1 1/2 tabs) in the morning and 40 mg (1 tab) in the afternoon 75 tablet 6  . gabapentin (NEURONTIN) 600 MG tablet Take 600 mg by mouth 3 (three) times daily.     . Hydrocortisone (GERHARDT'S BUTT CREAM) CREA Apply 1 application topically 3 (three) times daily. (Patient taking differently: Apply 1 application topically 4 (four) times daily as needed for irritation. ) 90 each 0  . insulin aspart (NOVOLOG) 100 UNIT/ML injection Inject 1-18 Units into the skin 3 (three) times daily with meals. (Patient taking differently: Inject 0-20 Units into the skin 4 (four) times daily -  before meals and at bedtime. )    . insulin detemir (LEVEMIR) 100  UNIT/ML injection Inject 0.4 mLs (40 Units total) into the skin at bedtime. 10 mL 0  . levothyroxine (SYNTHROID, LEVOTHROID) 200 MCG tablet Take 1 tablet by mouth daily before breakfast.     . nitroGLYCERIN (NITROSTAT) 0.4 MG SL tablet Place 0.4 mg under the tongue every 5 (five) minutes as needed for chest pain.     Marland Kitchen nystatin (MYCOSTATIN/NYSTOP) powder Apply topically 2 (two) times daily. (Patient taking differently:  Apply 1 g topically 2 (two) times daily. ) 15 g 0  . Omega-3 Fatty Acids (FISH OIL PO) Take 1 capsule by mouth daily.     . ondansetron (ZOFRAN) 8 MG tablet Take 1 tablet (8 mg total) by mouth every 8 (eight) hours as needed for nausea or vomiting. 120 tablet 0  . oxyCODONE-acetaminophen (PERCOCET) 10-325 MG tablet Take 1 tablet by mouth every 6 (six) hours as needed for pain.    . OXYGEN Inhale 2 L into the lungs continuous.    . pantoprazole (PROTONIX) 40 MG tablet Take 1 tablet (40 mg total) by mouth 2 (two) times daily. 180 tablet 3  . polyethylene glycol (MIRALAX / GLYCOLAX) 17 g packet Take 17 g by mouth 2 (two) times daily. 14 each 0  . potassium chloride (MICRO-K) 10 MEQ CR capsule Take 2 capsules (20 mEq total) by mouth 2 (two) times daily. *May take one additional tablet as needed for cramping 120 capsule 3  . prochlorperazine (COMPAZINE) 10 MG tablet Take 1 tablet (10 mg total) by mouth every 6 (six) hours as needed for nausea or vomiting. 30 tablet 0  . promethazine (PHENERGAN) 12.5 MG tablet Take 1 tablet (12.5 mg total) by mouth every 6 (six) hours as needed for nausea or vomiting. 45 tablet 1  . senna (SENOKOT) 8.6 MG TABS tablet Take 2 tablets (17.2 mg total) by mouth 2 (two) times daily with a meal. 120 each 0  . sucralfate (CARAFATE) 1 g tablet Take 1 tablet (1 g total) by mouth 4 (four) times daily -  with meals and at bedtime. 5 min before meals for radiation induced esophagitis 120 tablet 2  . SURE COMFORT INS SYR 1CC/28G 28G X 1/2" 1 ML MISC USE TO INJECT INSULIN UP TO 4 TIMES DAILY. 150 each 5  . tiZANidine (ZANAFLEX) 4 MG tablet Take 1 tablet (4 mg total) by mouth every 8 (eight) hours as needed for muscle spasms. 30 tablet 0  . topiramate (TOPAMAX) 25 MG tablet Take 75 mg by mouth at bedtime.     Marland Kitchen umeclidinium-vilanterol (ANORO ELLIPTA) 62.5-25 MCG/INH AEPB Inhale 1 puff into the lungs daily. 30 each 0   No current facility-administered medications for this visit.      PHYSICAL EXAMINATION: ECOG PERFORMANCE STATUS: 2 - Symptomatic, <50% confined to bed  Vitals:   12/15/18 1218  BP: (!) 150/67  Pulse: 96  Resp: 17  Temp: 98.5 F (36.9 C)  SpO2: 94%   Filed Weights   12/15/18 1218  Weight: 291 lb 12.8 oz (132.4 kg)    GENERAL:alert, no distress and comfortable, morbidly obese female in wheelchair SKIN: skin color, texture, turgor are normal, no rashes or significant lesions EYES: normal, Conjunctiva are pink and non-injected, sclera clear NECK: supple, thyroid normal size, non-tender, without nodularity LYMPH:  no palpable lymphadenopathy in the cervical, axillary  LUNGS: clear to auscultation and percussion with normal breathing effort HEART: regular rate & rhythm and no murmurs and no lower extremity edema ABDOMEN:abdomen soft, non-tender and normal bowel sounds LEGS: (+)  Moderate on both extremities, with skin pigmentation and mild erythema  LABORATORY DATA:  I have reviewed the data as listed CBC Latest Ref Rng & Units 12/15/2018 12/13/2018 12/10/2018  WBC 4.0 - 10.5 K/uL 4.0 2.8(L) 1.7(L)  Hemoglobin 12.0 - 15.0 g/dL 8.8(L) 8.9(L) 9.0(L)  Hematocrit 36.0 - 46.0 % 28.9(L) 28.7(L) 27.9(L)  Platelets 150 - 400 K/uL 273 236 116(L)     CMP Latest Ref Rng & Units 12/15/2018 12/13/2018 12/10/2018  Glucose 70 - 99 mg/dL 190(H) 231(H) 236(H)  BUN 8 - 23 mg/dL 25(H) 18 25(H)  Creatinine 0.44 - 1.00 mg/dL 1.85(H) 1.78(H) 1.72(H)  Sodium 135 - 145 mmol/L 142 140 139  Potassium 3.5 - 5.1 mmol/L 3.8 4.2 5.1  Chloride 98 - 111 mmol/L 104 102 104  CO2 22 - 32 mmol/L _0 Calcium 8.9 - 10.3 mg/dL 8.5(L) 9.2 9.6  Total Protein 6.5 - 8.1 g/dL 6.1(L) 6.4(L) 6.8  Total Bilirubin 0.3 - 1.2 mg/dL 0.3 0.4 0.7  Alkaline Phos 38 - 126 U/L 105 107 119  AST 15 - 41 U/L 11(L) 13(L) 13(L)  ALT 0 - 44 U/L _1 RADIOGRAPHIC STUDIES: I have personally reviewed the radiological images as listed and agreed with the findings in the report. No results  found.   ASSESSMENT & PLAN:  GLORIANNE PROCTOR is a 68 y.o. female with   1. Small Cell Lung cancer, LUL, cT3N2M0, stage III, limited stage  -Newly diagnosedin 5/2020during her work up for colon cancer -Ipreviouslydiscussed her lung cancer would take priority in terms of treatment given theaggressivenessof small cell lung cancer, and very high risk of recurrence evenwith curative intentconcurrentchemoradiation.  -She started first cycle chemo in [place], second cycle only received day 1 treatment dye to COVID(+), she tolerated cycle 3 with dose reduction very well.  -She started RTand cycle 4 chemoon 11/24/18.Tolerating moderately well. She has developed more fatigue and signs of radiation esophagitis. She devleoped pancytopenia with cycle 4.  -Labs reviewed, neutropenia and thrombocytopenia resolved, adequate for chemo today, will proceed cycle 5 carboplatin at the same dose as last cycle -Lab and follow-up weekly until she completes radiation.   2. 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  -Unfortunately she is not a candidate for colon cancer surgery due to very high risk ofsurgical mortality.She was seen by surgery during her recent hospitalization.  -I discussed the incurablenature of her colon cancer without surgery. -Willcontinueto monitor on current treatment for her SCLC. -if she recovers from chemoRT for Stephanie Combs, will consider low intensity chemo such as Xeloda or Irinotecan (which works for [employer] also). Given normal MMR, she is not a candidate for immunotherapy. Given primary right side colon cancer, not much benefit from EGFR inhibitors even if KRAS/NRAS/BRAF wild type.  3. 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 -anemia slightly  worse due to chemo -Continue monitoring -Last blood trasnfusion on 12/10/18.  -hg 8.8 today (12/15/18), no need blood transfusion   4. CAD, CHF, HTN, significant LE edema, CKD -OnLipitor, lasix, nitro -Continue to f/u with cardiologist. -Her CHFand fluid overload has muchimproved during her recent hospitalization. -She has been on 1m Lasix in the morning.She responded well to iv lasixwhich I give during his chemo -I will hold her oral lasix and give iv lasix 443mailyduring chemothis week due to her weight gain  and slightly worse Cr  5.DM,hypothyroidism, morbid obesity -On insulin novolog and long acting levemir. OnLevothyroxine. -Willmonitor glucose closely during her chemo treatment -stable   6.Rheumatoidarthritis, chronic back pain  -On Percocet which she takes 4 times a day.  -She is also hasGabapentin.  -She use to receiveinjectionfor her back -Pain chronic, stable. She will continue to see her RA and pain specialist.   7. Depression and anxiety -She has been more depressed with recent diagnosis of 2 cancers -She is currently on Celebrex, will continue.  -stable   8.Goal of care discussion, DNR/DNI -We previously discussed the incurable nature of her cancer, and the overall poor prognosis, especially ifshe does not have good response to chemotherapy or progress on chemo -The patient understands the goal of care is palliative. -I recommend DNR/DNI,she has agreed to DRI/DNR.She has written will and has established her daughter as her POA.  9. history of COVID-19 -She was incidentally found to be positive for COVID-19on 10/11/2018, no respiratory symptoms or evidence of pneumonia,she hasGIsymptoms which could be relate -Her testing on 11/04/18 was positive again. -no COVID symptoms    Plan: -lab reviewed, adequate for treatment, will proceed to cycle 5 carboplatin and etoposide -IV Lasix 40 mg daily for the next 3 days with  chemotherapy -Continue RT -And follow-up weekly until she completes radiation   No problem-specific Assessment & Plan notes found for this encounter.   No orders of the defined types were placed in this encounter.  All questions were answered. The patient knows to call the clinic with any problems, questions or concerns. No barriers to learning was detected. I spent 20 minutes counseling the patient face to face. The total time spent in the appointment was 25 minutes and more than 50% was on counseling and review of test results     Truitt Merle, MD 12/10/2018   I, Joslyn Devon, am acting as scribe for Truitt Merle, MD.   I have reviewed the above documentation for accuracy and completeness, and I agree with the above.

## 2018-12-10 NOTE — Progress Notes (Signed)
Per Dr Burr Medico pt does not need a transfusion today.  Labs reviewed with pt.

## 2018-12-10 NOTE — Telephone Encounter (Signed)
I left a message regarding 8/3

## 2018-12-11 DIAGNOSIS — N183 Chronic kidney disease, stage 3 (moderate): Secondary | ICD-10-CM | POA: Diagnosis not present

## 2018-12-11 DIAGNOSIS — I5033 Acute on chronic diastolic (congestive) heart failure: Secondary | ICD-10-CM | POA: Diagnosis not present

## 2018-12-11 DIAGNOSIS — I130 Hypertensive heart and chronic kidney disease with heart failure and stage 1 through stage 4 chronic kidney disease, or unspecified chronic kidney disease: Secondary | ICD-10-CM | POA: Diagnosis not present

## 2018-12-11 DIAGNOSIS — I5022 Chronic systolic (congestive) heart failure: Secondary | ICD-10-CM | POA: Diagnosis not present

## 2018-12-11 DIAGNOSIS — E1122 Type 2 diabetes mellitus with diabetic chronic kidney disease: Secondary | ICD-10-CM | POA: Diagnosis not present

## 2018-12-12 LAB — TYPE AND SCREEN
ABO/RH(D): A POS
Antibody Screen: POSITIVE
Donor AG Type: NEGATIVE
Donor AG Type: NEGATIVE
Unit division: 0
Unit division: 0

## 2018-12-12 LAB — BPAM RBC
Blood Product Expiration Date: 202008212359
Blood Product Expiration Date: 202008232359
ISSUE DATE / TIME: 202007291335
Unit Type and Rh: 6200
Unit Type and Rh: 6200

## 2018-12-13 ENCOUNTER — Ambulatory Visit: Payer: Medicare Other

## 2018-12-13 ENCOUNTER — Other Ambulatory Visit: Payer: Self-pay

## 2018-12-13 ENCOUNTER — Ambulatory Visit
Admission: RE | Admit: 2018-12-13 | Discharge: 2018-12-13 | Disposition: A | Discharge status: Home / Self Care | Payer: Medicare Other | Source: Ambulatory Visit | Attending: Radiation Oncology | Admitting: Radiation Oncology

## 2018-12-13 ENCOUNTER — Inpatient Hospital Stay: Payer: Medicare Other | Attending: Hematology

## 2018-12-13 DIAGNOSIS — E039 Hypothyroidism, unspecified: Secondary | ICD-10-CM | POA: Insufficient documentation

## 2018-12-13 DIAGNOSIS — Z8619 Personal history of other infectious and parasitic diseases: Secondary | ICD-10-CM | POA: Diagnosis not present

## 2018-12-13 DIAGNOSIS — D509 Iron deficiency anemia, unspecified: Secondary | ICD-10-CM | POA: Diagnosis not present

## 2018-12-13 DIAGNOSIS — C19 Malignant neoplasm of rectosigmoid junction: Secondary | ICD-10-CM | POA: Insufficient documentation

## 2018-12-13 DIAGNOSIS — I10 Essential (primary) hypertension: Secondary | ICD-10-CM | POA: Insufficient documentation

## 2018-12-13 DIAGNOSIS — F329 Major depressive disorder, single episode, unspecified: Secondary | ICD-10-CM | POA: Diagnosis not present

## 2018-12-13 DIAGNOSIS — C3490 Malignant neoplasm of unspecified part of unspecified bronchus or lung: Secondary | ICD-10-CM | POA: Insufficient documentation

## 2018-12-13 DIAGNOSIS — I110 Hypertensive heart disease with heart failure: Secondary | ICD-10-CM | POA: Insufficient documentation

## 2018-12-13 DIAGNOSIS — Z794 Long term (current) use of insulin: Secondary | ICD-10-CM | POA: Insufficient documentation

## 2018-12-13 DIAGNOSIS — D61810 Antineoplastic chemotherapy induced pancytopenia: Secondary | ICD-10-CM | POA: Diagnosis not present

## 2018-12-13 DIAGNOSIS — E119 Type 2 diabetes mellitus without complications: Secondary | ICD-10-CM | POA: Insufficient documentation

## 2018-12-13 DIAGNOSIS — C183 Malignant neoplasm of hepatic flexure: Secondary | ICD-10-CM | POA: Diagnosis not present

## 2018-12-13 DIAGNOSIS — Z79899 Other long term (current) drug therapy: Secondary | ICD-10-CM | POA: Diagnosis not present

## 2018-12-13 DIAGNOSIS — I2510 Atherosclerotic heart disease of native coronary artery without angina pectoris: Secondary | ICD-10-CM | POA: Diagnosis not present

## 2018-12-13 DIAGNOSIS — T451X5A Adverse effect of antineoplastic and immunosuppressive drugs, initial encounter: Secondary | ICD-10-CM | POA: Insufficient documentation

## 2018-12-13 DIAGNOSIS — I509 Heart failure, unspecified: Secondary | ICD-10-CM | POA: Diagnosis not present

## 2018-12-13 DIAGNOSIS — Z7982 Long term (current) use of aspirin: Secondary | ICD-10-CM | POA: Diagnosis not present

## 2018-12-13 DIAGNOSIS — Z510 Encounter for antineoplastic radiation therapy: Secondary | ICD-10-CM | POA: Insufficient documentation

## 2018-12-13 DIAGNOSIS — M069 Rheumatoid arthritis, unspecified: Secondary | ICD-10-CM | POA: Insufficient documentation

## 2018-12-13 DIAGNOSIS — F419 Anxiety disorder, unspecified: Secondary | ICD-10-CM | POA: Insufficient documentation

## 2018-12-13 DIAGNOSIS — Z5111 Encounter for antineoplastic chemotherapy: Secondary | ICD-10-CM | POA: Diagnosis not present

## 2018-12-13 DIAGNOSIS — C3412 Malignant neoplasm of upper lobe, left bronchus or lung: Secondary | ICD-10-CM | POA: Diagnosis not present

## 2018-12-13 LAB — CBC WITH DIFFERENTIAL (CANCER CENTER ONLY)
Abs Immature Granulocytes: 0.05 10*3/uL (ref 0.00–0.07)
Basophils Absolute: 0 10*3/uL (ref 0.0–0.1)
Basophils Relative: 1 %
Eosinophils Absolute: 0.1 10*3/uL (ref 0.0–0.5)
Eosinophils Relative: 3 %
HCT: 28.7 % — ABNORMAL LOW (ref 36.0–46.0)
Hemoglobin: 8.9 g/dL — ABNORMAL LOW (ref 12.0–15.0)
Immature Granulocytes: 2 %
Lymphocytes Relative: 28 %
Lymphs Abs: 0.8 10*3/uL (ref 0.7–4.0)
MCH: 31.4 pg (ref 26.0–34.0)
MCHC: 31 g/dL (ref 30.0–36.0)
MCV: 101.4 fL — ABNORMAL HIGH (ref 80.0–100.0)
Monocytes Absolute: 0.5 10*3/uL (ref 0.1–1.0)
Monocytes Relative: 17 %
Neutro Abs: 1.4 10*3/uL — ABNORMAL LOW (ref 1.7–7.7)
Neutrophils Relative %: 49 %
Platelet Count: 236 10*3/uL (ref 150–400)
RBC: 2.83 MIL/uL — ABNORMAL LOW (ref 3.87–5.11)
RDW: 19.1 % — ABNORMAL HIGH (ref 11.5–15.5)
WBC Count: 2.8 10*3/uL — ABNORMAL LOW (ref 4.0–10.5)
nRBC: 0 % (ref 0.0–0.2)

## 2018-12-13 LAB — CMP (CANCER CENTER ONLY)
ALT: 10 U/L (ref 0–44)
AST: 13 U/L — ABNORMAL LOW (ref 15–41)
Albumin: 3 g/dL — ABNORMAL LOW (ref 3.5–5.0)
Alkaline Phosphatase: 107 U/L (ref 38–126)
Anion gap: 10 (ref 5–15)
BUN: 18 mg/dL (ref 8–23)
CO2: 28 mmol/L (ref 22–32)
Calcium: 9.2 mg/dL (ref 8.9–10.3)
Chloride: 102 mmol/L (ref 98–111)
Creatinine: 1.78 mg/dL — ABNORMAL HIGH (ref 0.44–1.00)
GFR, Est AFR Am: 33 mL/min — ABNORMAL LOW (ref >60)
GFR, Estimated: 29 mL/min — ABNORMAL LOW (ref >60)
Glucose, Bld: 231 mg/dL — ABNORMAL HIGH (ref 70–99)
Potassium: 4.2 mmol/L (ref 3.5–5.1)
Sodium: 140 mmol/L (ref 135–145)
Total Bilirubin: 0.4 mg/dL (ref 0.3–1.2)
Total Protein: 6.4 g/dL — ABNORMAL LOW (ref 6.5–8.1)

## 2018-12-13 NOTE — Patient Outreach (Signed)
[facility]) Care Management  12/13/2018  Stephanie Combs [birth year] [id number]    Current Outpatient Medications on File Prior to Visit  Medication Sig Dispense Refill  . albuterol (PROAIR HFA) 108 (90 Base) MCG/ACT inhaler Inhale 2 puffs into the lungs every 4 (four) hours as needed for wheezing or shortness of breath. For shortness of breath 18 g 0  . albuterol (PROVENTIL) (2.5 MG/3ML) 0.083% nebulizer solution Take 2.5 mg by nebulization 2 (two) times daily as needed for wheezing or shortness of breath.     . allopurinol (ZYLOPRIM) 300 MG tablet Take 1 tablet (300 mg total) by mouth daily for 15 days. 30 tablet 0  . ALPRAZolam (XANAX) 1 MG tablet Take 1 mg by mouth 3 (three) times daily as needed for anxiety or sleep.     Marland Kitchen aspirin EC 81 MG tablet Take 81 mg by mouth daily.    Marland Kitchen atorvastatin (LIPITOR) 80 MG tablet Take 1 tablet (80 mg total) by mouth daily at 6 PM. 30 tablet 0  . cholecalciferol (VITAMIN D3) 25 MCG (1000 UT) tablet Take 5,000 Units by mouth daily.    . furosemide (LASIX) 40 MG tablet Take 60 mg (1 1/2 tabs) in the morning and 40 mg (1 tab) in the afternoon 75 tablet 6  . gabapentin (NEURONTIN) 600 MG tablet Take 600 mg by mouth 3 (three) times daily.     . Hydrocortisone (GERHARDT'S BUTT CREAM) CREA Apply 1 application topically 3 (three) times daily. (Patient taking differently: Apply 1 application topically 4 (four) times daily as needed for irritation. ) 90 each 0  . insulin aspart (NOVOLOG) 100 UNIT/ML injection Inject 1-18 Units into the skin 3 (three) times daily with meals. (Patient taking differently: Inject 0-20 Units into the skin 4 (four) times daily -  before meals and at bedtime. )    . insulin detemir (LEVEMIR) 100 UNIT/ML injection Inject 0.4 mLs (40 Units total) into the skin at bedtime. 10 mL 0  . levothyroxine (SYNTHROID, LEVOTHROID) 200 MCG tablet Take 1 tablet by mouth daily before breakfast.     . nitroGLYCERIN (NITROSTAT) 0.4 MG SL  tablet Place 0.4 mg under the tongue every 5 (five) minutes as needed for chest pain.     Marland Kitchen nystatin (MYCOSTATIN/NYSTOP) powder Apply topically 2 (two) times daily. (Patient taking differently: Apply 1 g topically 2 (two) times daily. ) 15 g 0  . Omega-3 Fatty Acids (FISH OIL PO) Take 1 capsule by mouth daily.     . ondansetron (ZOFRAN) 8 MG tablet Take 1 tablet (8 mg total) by mouth every 8 (eight) hours as needed for nausea or vomiting. 120 tablet 0  . oxyCODONE-acetaminophen (PERCOCET) 10-325 MG tablet Take 1 tablet by mouth every 6 (six) hours as needed for pain.    . OXYGEN Inhale 2 L into the lungs continuous.    . pantoprazole (PROTONIX) 40 MG tablet Take 1 tablet (40 mg total) by mouth 2 (two) times daily. 180 tablet 3  . polyethylene glycol (MIRALAX / GLYCOLAX) 17 g packet Take 17 g by mouth 2 (two) times daily. 14 each 0  . potassium chloride (MICRO-K) 10 MEQ CR capsule Take 2 capsules (20 mEq total) by mouth 2 (two) times daily. *May take one additional tablet as needed for cramping 120 capsule 3  . prochlorperazine (COMPAZINE) 10 MG tablet Take 1 tablet (10 mg total) by mouth every 6 (six) hours as needed for nausea or vomiting. 30 tablet 0  . promethazine (  PHENERGAN) 12.5 MG tablet Take 1 tablet (12.5 mg total) by mouth every 6 (six) hours as needed for nausea or vomiting. 45 tablet 1  . senna (SENOKOT) 8.6 MG TABS tablet Take 2 tablets (17.2 mg total) by mouth 2 (two) times daily with a meal. 120 each 0  . sucralfate (CARAFATE) 1 g tablet Take 1 tablet (1 g total) by mouth 4 (four) times daily -  with meals and at bedtime. 5 min before meals for radiation induced esophagitis 120 tablet 2  . SURE COMFORT INS SYR 1CC/28G 28G X 1/2" 1 ML MISC USE TO INJECT INSULIN UP TO 4 TIMES DAILY. 150 each 5  . tiZANidine (ZANAFLEX) 4 MG tablet Take 1 tablet (4 mg total) by mouth every 8 (eight) hours as needed for muscle spasms. 30 tablet 0  . topiramate (TOPAMAX) 25 MG tablet Take 75 mg by mouth at  bedtime.     Marland Kitchen umeclidinium-vilanterol (ANORO ELLIPTA) 62.5-25 MCG/INH AEPB Inhale 1 puff into the lungs daily. 30 each 0   No current facility-administered medications on file prior to visit.     THN CM Care Plan Problem One     Most Recent Value  Care Plan Problem One  High Risk for Admission  Role Documenting the Problem One  Care Management [place] for Problem One  Active  THN Long Term Goal   Over the next 90 days, patient will not be hospitalized for complications related to chronic illnesses.  THN Long Term Goal Start Date  11/29/18  Interventions for Problem One Long Term Goal  Discussed compliance with medications and treatment recommendations. Patient reports taking medications as prescribed. Reports FBS of 115mg /dl. Reports not checking weight but denies s/sx suggestive of CHF exacerbation. Denies changes in activity tolerance.   THN CM Short Term Goal #1   Over the next 30 days patient will take all medications as prescribed.  THN CM Short Term Goal #1 Start Date  11/29/18  Interventions for Short Term Goal #1  Discussed medications and indications for use. Additional anti-emetics ordered d/t treatment related nausea.   THN CM Short Term Goal #2   Over the next 30 days, patient will attend all appointments and provider follow-ups as scheduled.  THN CM Short Term Goal #2 Start Date  11/29/18  Interventions for Short Term Goal #2  Reviewed pending appointments. Patient has daily treatment appointments and several pending provider follow-ups. [Denies current need for transportation assistance.]  THN CM Short Term Goal #3  Over the next 30 days, patient will consistently monitor and record her blood sugar levels.  THN CM Short Term Goal #3 Start Date  11/29/18  Interventions for Short [place]ern Goal #3  Discussed blood sugar levels. Discussed s/sx of hypoglycemia and hyperglycemia along with recommended interventions. Discussed nutritional needs and compliance with diabetic  diet. [Concerns regarding nutrition. Will refer for meal delivery.]    [facility] CM Care Plan Problem Two     Most Recent Value  Care Plan Problem Two  High Risk for Falls  Role Documenting the Problem Two  Care Management [place] for Problem Two  Active  Interventions for Problem Two Long Term Goal   Reviewed home safety and fall prevention measures.  THN Long Term Goal  Patient will not sustain injuries d/t falls over the next 60 days.  THN Long Term Goal Start Date  11/29/18  THN CM Short Term Goal #1   Over the next 30 days, patient will  use assistive device when ambulating inside and outside of the home.  THN CM Short Term Goal #1 Start Date  11/29/18  Interventions for Short Term Goal #2   Reviewed fall prevention measures. Patient reports using devices as recommended.   THN CM Short Term Goal #2   Over the next 30 days, patient will refrain from activities that cause overexertion.  THN CM Short Term Goal #2 Start Date  11/29/18  Interventions for Short Term Goal #2  Reviewed home safety measures and importance of avoiding overexertion and requesting assistance when needed.  [Reports daughter is currently available to assist.]       PLAN -Ms. Serres expressed nutritional concerns. Will refer to [place] for [facility] meal program. -Will continue routine outreach.   [place] [phone]

## 2018-12-14 ENCOUNTER — Other Ambulatory Visit: Payer: Self-pay

## 2018-12-14 ENCOUNTER — Ambulatory Visit
Admission: RE | Admit: 2018-12-14 | Discharge: 2018-12-14 | Disposition: A | Discharge status: Home / Self Care | Payer: Medicare Other | Source: Ambulatory Visit | Attending: Radiation Oncology | Admitting: Radiation Oncology

## 2018-12-14 ENCOUNTER — Telehealth: Payer: Self-pay | Admitting: *Deleted

## 2018-12-14 ENCOUNTER — Ambulatory Visit: Payer: Medicare Other

## 2018-12-14 DIAGNOSIS — Z510 Encounter for antineoplastic radiation therapy: Secondary | ICD-10-CM | POA: Diagnosis not present

## 2018-12-14 DIAGNOSIS — J449 Chronic obstructive pulmonary disease, unspecified: Secondary | ICD-10-CM | POA: Diagnosis not present

## 2018-12-14 DIAGNOSIS — C3412 Malignant neoplasm of upper lobe, left bronchus or lung: Secondary | ICD-10-CM | POA: Diagnosis not present

## 2018-12-14 DIAGNOSIS — E039 Hypothyroidism, unspecified: Secondary | ICD-10-CM | POA: Diagnosis not present

## 2018-12-14 DIAGNOSIS — I509 Heart failure, unspecified: Secondary | ICD-10-CM | POA: Diagnosis not present

## 2018-12-14 DIAGNOSIS — I2510 Atherosclerotic heart disease of native coronary artery without angina pectoris: Secondary | ICD-10-CM | POA: Diagnosis not present

## 2018-12-14 DIAGNOSIS — C19 Malignant neoplasm of rectosigmoid junction: Secondary | ICD-10-CM | POA: Diagnosis not present

## 2018-12-14 DIAGNOSIS — E119 Type 2 diabetes mellitus without complications: Secondary | ICD-10-CM | POA: Diagnosis not present

## 2018-12-14 DIAGNOSIS — D509 Iron deficiency anemia, unspecified: Secondary | ICD-10-CM | POA: Diagnosis not present

## 2018-12-14 DIAGNOSIS — I110 Hypertensive heart disease with heart failure: Secondary | ICD-10-CM | POA: Diagnosis not present

## 2018-12-14 DIAGNOSIS — C3490 Malignant neoplasm of unspecified part of unspecified bronchus or lung: Secondary | ICD-10-CM | POA: Diagnosis not present

## 2018-12-14 NOTE — Telephone Encounter (Signed)
Received call from patient  She is requesting a refill for her Allopurinol.  Prescription appears to be only for 15 days when originally written for in June. Pt has appointment with Dr. Burr Medico tomorrow. Pt will discuss with Dr. Burr Medico at her appt tomorrow.

## 2018-12-15 ENCOUNTER — Encounter: Payer: Self-pay | Admitting: Hematology

## 2018-12-15 ENCOUNTER — Inpatient Hospital Stay: Payer: Medicare Other

## 2018-12-15 ENCOUNTER — Ambulatory Visit: Payer: Medicare Other

## 2018-12-15 ENCOUNTER — Telehealth: Payer: Self-pay | Admitting: Hematology

## 2018-12-15 ENCOUNTER — Inpatient Hospital Stay (HOSPITAL_BASED_OUTPATIENT_CLINIC_OR_DEPARTMENT_OTHER): Payer: Medicare Other | Admitting: Hematology

## 2018-12-15 ENCOUNTER — Ambulatory Visit
Admission: RE | Admit: 2018-12-15 | Discharge: 2018-12-15 | Disposition: A | Discharge status: Home / Self Care | Payer: Medicare Other | Source: Ambulatory Visit | Attending: Radiation Oncology | Admitting: Radiation Oncology

## 2018-12-15 ENCOUNTER — Other Ambulatory Visit: Payer: Self-pay

## 2018-12-15 VITALS — BP 150/67 | HR 96 | Temp 98.5°F | Resp 17 | Ht 61.5 in | Wt 291.8 lb

## 2018-12-15 DIAGNOSIS — I110 Hypertensive heart disease with heart failure: Secondary | ICD-10-CM | POA: Diagnosis not present

## 2018-12-15 DIAGNOSIS — Z8619 Personal history of other infectious and parasitic diseases: Secondary | ICD-10-CM | POA: Diagnosis not present

## 2018-12-15 DIAGNOSIS — N183 Chronic kidney disease, stage 3 unspecified: Secondary | ICD-10-CM

## 2018-12-15 DIAGNOSIS — Z7982 Long term (current) use of aspirin: Secondary | ICD-10-CM | POA: Diagnosis not present

## 2018-12-15 DIAGNOSIS — I5032 Chronic diastolic (congestive) heart failure: Secondary | ICD-10-CM

## 2018-12-15 DIAGNOSIS — C19 Malignant neoplasm of rectosigmoid junction: Secondary | ICD-10-CM | POA: Diagnosis not present

## 2018-12-15 DIAGNOSIS — C3412 Malignant neoplasm of upper lobe, left bronchus or lung: Secondary | ICD-10-CM | POA: Diagnosis not present

## 2018-12-15 DIAGNOSIS — C183 Malignant neoplasm of hepatic flexure: Secondary | ICD-10-CM | POA: Diagnosis not present

## 2018-12-15 DIAGNOSIS — M069 Rheumatoid arthritis, unspecified: Secondary | ICD-10-CM | POA: Diagnosis not present

## 2018-12-15 DIAGNOSIS — N182 Chronic kidney disease, stage 2 (mild): Secondary | ICD-10-CM

## 2018-12-15 DIAGNOSIS — I509 Heart failure, unspecified: Secondary | ICD-10-CM | POA: Diagnosis not present

## 2018-12-15 DIAGNOSIS — Z794 Long term (current) use of insulin: Secondary | ICD-10-CM

## 2018-12-15 DIAGNOSIS — E119 Type 2 diabetes mellitus without complications: Secondary | ICD-10-CM | POA: Diagnosis not present

## 2018-12-15 DIAGNOSIS — E1122 Type 2 diabetes mellitus with diabetic chronic kidney disease: Secondary | ICD-10-CM

## 2018-12-15 DIAGNOSIS — Z79899 Other long term (current) drug therapy: Secondary | ICD-10-CM | POA: Diagnosis not present

## 2018-12-15 DIAGNOSIS — I2510 Atherosclerotic heart disease of native coronary artery without angina pectoris: Secondary | ICD-10-CM | POA: Diagnosis not present

## 2018-12-15 DIAGNOSIS — T451X5A Adverse effect of antineoplastic and immunosuppressive drugs, initial encounter: Secondary | ICD-10-CM | POA: Diagnosis not present

## 2018-12-15 DIAGNOSIS — C3490 Malignant neoplasm of unspecified part of unspecified bronchus or lung: Secondary | ICD-10-CM | POA: Diagnosis not present

## 2018-12-15 DIAGNOSIS — Z510 Encounter for antineoplastic radiation therapy: Secondary | ICD-10-CM | POA: Diagnosis not present

## 2018-12-15 DIAGNOSIS — D61810 Antineoplastic chemotherapy induced pancytopenia: Secondary | ICD-10-CM | POA: Diagnosis not present

## 2018-12-15 DIAGNOSIS — D509 Iron deficiency anemia, unspecified: Secondary | ICD-10-CM | POA: Diagnosis not present

## 2018-12-15 DIAGNOSIS — I10 Essential (primary) hypertension: Secondary | ICD-10-CM | POA: Diagnosis not present

## 2018-12-15 DIAGNOSIS — E039 Hypothyroidism, unspecified: Secondary | ICD-10-CM | POA: Diagnosis not present

## 2018-12-15 DIAGNOSIS — Z5111 Encounter for antineoplastic chemotherapy: Secondary | ICD-10-CM | POA: Diagnosis not present

## 2018-12-15 LAB — CMP (CANCER CENTER ONLY)
ALT: 8 U/L (ref 0–44)
AST: 11 U/L — ABNORMAL LOW (ref 15–41)
Albumin: 2.9 g/dL — ABNORMAL LOW (ref 3.5–5.0)
Alkaline Phosphatase: 105 U/L (ref 38–126)
Anion gap: 10 (ref 5–15)
BUN: 25 mg/dL — ABNORMAL HIGH (ref 8–23)
CO2: 28 mmol/L (ref 22–32)
Calcium: 8.5 mg/dL — ABNORMAL LOW (ref 8.9–10.3)
Chloride: 104 mmol/L (ref 98–111)
Creatinine: 1.85 mg/dL — ABNORMAL HIGH (ref 0.44–1.00)
GFR, Est AFR Am: 32 mL/min — ABNORMAL LOW (ref >60)
GFR, Estimated: 27 mL/min — ABNORMAL LOW (ref >60)
Glucose, Bld: 190 mg/dL — ABNORMAL HIGH (ref 70–99)
Potassium: 3.8 mmol/L (ref 3.5–5.1)
Sodium: 142 mmol/L (ref 135–145)
Total Bilirubin: 0.3 mg/dL (ref 0.3–1.2)
Total Protein: 6.1 g/dL — ABNORMAL LOW (ref 6.5–8.1)

## 2018-12-15 LAB — CBC WITH DIFFERENTIAL (CANCER CENTER ONLY)
Abs Immature Granulocytes: 0.07 10*3/uL (ref 0.00–0.07)
Basophils Absolute: 0.1 10*3/uL (ref 0.0–0.1)
Basophils Relative: 1 %
Eosinophils Absolute: 0.1 10*3/uL (ref 0.0–0.5)
Eosinophils Relative: 2 %
HCT: 28.9 % — ABNORMAL LOW (ref 36.0–46.0)
Hemoglobin: 8.8 g/dL — ABNORMAL LOW (ref 12.0–15.0)
Immature Granulocytes: 2 %
Lymphocytes Relative: 17 %
Lymphs Abs: 0.7 10*3/uL (ref 0.7–4.0)
MCH: 31.3 pg (ref 26.0–34.0)
MCHC: 30.4 g/dL (ref 30.0–36.0)
MCV: 102.8 fL — ABNORMAL HIGH (ref 80.0–100.0)
Monocytes Absolute: 0.5 10*3/uL (ref 0.1–1.0)
Monocytes Relative: 13 %
Neutro Abs: 2.6 10*3/uL (ref 1.7–7.7)
Neutrophils Relative %: 65 %
Platelet Count: 273 10*3/uL (ref 150–400)
RBC: 2.81 MIL/uL — ABNORMAL LOW (ref 3.87–5.11)
RDW: 19.1 % — ABNORMAL HIGH (ref 11.5–15.5)
WBC Count: 4 10*3/uL (ref 4.0–10.5)
nRBC: 0 % (ref 0.0–0.2)

## 2018-12-15 MED ORDER — DEXAMETHASONE SODIUM PHOSPHATE 10 MG/ML IJ SOLN
10.0000 mg | Freq: Once | INTRAMUSCULAR | Status: AC
  Administered 2018-12-15: 10 mg via INTRAVENOUS

## 2018-12-15 MED ORDER — ALLOPURINOL 300 MG PO TABS: 300.0000 mg | ORAL_TABLET | Freq: Every day | ORAL | 3 refills | Status: DC

## 2018-12-15 MED ORDER — ONDANSETRON HCL 8 MG PO TABS: 8.0000 mg | ORAL_TABLET | Freq: Three times a day (TID) | ORAL | 2 refills | Status: DC | PRN

## 2018-12-15 MED ORDER — SODIUM CHLORIDE 0.9 % IV SOLN
75.0000 mg/m2 | Freq: Once | INTRAVENOUS | Status: AC
  Administered 2018-12-15: 190 mg via INTRAVENOUS
  Filled 2018-12-15: qty 9.5

## 2018-12-15 MED ORDER — FUROSEMIDE 10 MG/ML IJ SOLN
INTRAMUSCULAR | Status: AC
  Filled 2018-12-15: qty 2

## 2018-12-15 MED ORDER — SODIUM CHLORIDE 0.9 % IV SOLN
311.1500 mg | Freq: Once | INTRAVENOUS | Status: AC
  Administered 2018-12-15: 310 mg via INTRAVENOUS
  Filled 2018-12-15: qty 31

## 2018-12-15 MED ORDER — PALONOSETRON HCL INJECTION 0.25 MG/5ML
0.2500 mg | Freq: Once | INTRAVENOUS | Status: AC
  Administered 2018-12-15: 0.25 mg via INTRAVENOUS

## 2018-12-15 MED ORDER — SODIUM CHLORIDE 0.9 % IV SOLN
Freq: Once | INTRAVENOUS | Status: AC
  Administered 2018-12-15: 13:00:00 via INTRAVENOUS
  Filled 2018-12-15: qty 250

## 2018-12-15 MED ORDER — FUROSEMIDE 10 MG/ML IJ SOLN
40.0000 mg | Freq: Once | INTRAMUSCULAR | Status: AC
  Administered 2018-12-15: 40 mg via INTRAVENOUS

## 2018-12-15 MED ORDER — PALONOSETRON HCL INJECTION 0.25 MG/5ML
INTRAVENOUS | Status: AC
  Filled 2018-12-15: qty 5

## 2018-12-15 MED ORDER — DEXAMETHASONE SODIUM PHOSPHATE 10 MG/ML IJ SOLN
INTRAMUSCULAR | Status: AC
  Filled 2018-12-15: qty 1

## 2018-12-15 NOTE — Patient Outreach (Signed)
[facility]) Care Management  12/15/2018  SUNDAI PROBERT [birth year] [id number]   In-basket message received from [facility], Neldon Labella, to link patient to [place].  Patient will receive first meal delivery on Saturday, 12/18/18 and last delivery on Saturday, 01/01/19.  [name], BSW Social Worker [phone]

## 2018-12-15 NOTE — Patient Instructions (Signed)
[place] [place] Discharge Instructions for Patients Receiving Chemotherapy  Today you received the following chemotherapy agents Carboplatin and Etoposide.  To help prevent nausea and vomiting after your treatment, we encourage you to take your nausea medication as directed BUT NO ZOFRAN FOR 3 DAYS AFTER CHEMO.   If you develop nausea and vomiting that is not controlled by your nausea medication, call the clinic.   BELOW ARE SYMPTOMS THAT SHOULD BE REPORTED IMMEDIATELY:  *FEVER GREATER THAN 100.5 F  *CHILLS WITH OR WITHOUT FEVER  NAUSEA AND VOMITING THAT IS NOT CONTROLLED WITH YOUR NAUSEA MEDICATION  *UNUSUAL SHORTNESS OF BREATH  *UNUSUAL BRUISING OR BLEEDING  TENDERNESS IN MOUTH AND THROAT WITH OR WITHOUT PRESENCE OF ULCERS  *URINARY PROBLEMS  *BOWEL PROBLEMS  UNUSUAL RASH Items with * indicate a potential emergency and should be followed up as soon as possible.  Feel free to call the clinic you have any questions or concerns. The clinic phone number is [phone].  Please show the [place] at check-in to the Emergency Department and triage nurse.

## 2018-12-15 NOTE — Progress Notes (Signed)
Pt's creatnine 1.85, OK to treat per Dr. Burr Medico.

## 2018-12-15 NOTE — Telephone Encounter (Signed)
Scheduled appt per 8/5 los.  Infusion nurse will give the patient a print out of the new appt calendar.

## 2018-12-16 ENCOUNTER — Other Ambulatory Visit: Payer: Self-pay

## 2018-12-16 ENCOUNTER — Telehealth: Payer: Self-pay

## 2018-12-16 ENCOUNTER — Encounter: Payer: Self-pay | Admitting: Hematology

## 2018-12-16 ENCOUNTER — Ambulatory Visit: Payer: Medicare Other

## 2018-12-16 ENCOUNTER — Inpatient Hospital Stay: Payer: Medicare Other

## 2018-12-16 ENCOUNTER — Ambulatory Visit
Admission: RE | Admit: 2018-12-16 | Discharge: 2018-12-16 | Disposition: A | Discharge status: Home / Self Care | Payer: Medicare Other | Source: Ambulatory Visit | Attending: Radiation Oncology | Admitting: Radiation Oncology

## 2018-12-16 VITALS — BP 92/40 | HR 61 | Temp 98.3°F | Resp 18

## 2018-12-16 DIAGNOSIS — M069 Rheumatoid arthritis, unspecified: Secondary | ICD-10-CM | POA: Diagnosis not present

## 2018-12-16 DIAGNOSIS — D61810 Antineoplastic chemotherapy induced pancytopenia: Secondary | ICD-10-CM | POA: Diagnosis not present

## 2018-12-16 DIAGNOSIS — C3412 Malignant neoplasm of upper lobe, left bronchus or lung: Secondary | ICD-10-CM

## 2018-12-16 DIAGNOSIS — I2510 Atherosclerotic heart disease of native coronary artery without angina pectoris: Secondary | ICD-10-CM | POA: Diagnosis not present

## 2018-12-16 DIAGNOSIS — E039 Hypothyroidism, unspecified: Secondary | ICD-10-CM | POA: Diagnosis not present

## 2018-12-16 DIAGNOSIS — Z510 Encounter for antineoplastic radiation therapy: Secondary | ICD-10-CM | POA: Diagnosis not present

## 2018-12-16 DIAGNOSIS — I509 Heart failure, unspecified: Secondary | ICD-10-CM | POA: Diagnosis not present

## 2018-12-16 DIAGNOSIS — C3490 Malignant neoplasm of unspecified part of unspecified bronchus or lung: Secondary | ICD-10-CM | POA: Diagnosis not present

## 2018-12-16 DIAGNOSIS — Z79899 Other long term (current) drug therapy: Secondary | ICD-10-CM | POA: Diagnosis not present

## 2018-12-16 DIAGNOSIS — Z8619 Personal history of other infectious and parasitic diseases: Secondary | ICD-10-CM | POA: Diagnosis not present

## 2018-12-16 DIAGNOSIS — Z7982 Long term (current) use of aspirin: Secondary | ICD-10-CM | POA: Diagnosis not present

## 2018-12-16 DIAGNOSIS — T451X5A Adverse effect of antineoplastic and immunosuppressive drugs, initial encounter: Secondary | ICD-10-CM | POA: Diagnosis not present

## 2018-12-16 DIAGNOSIS — Z794 Long term (current) use of insulin: Secondary | ICD-10-CM | POA: Diagnosis not present

## 2018-12-16 DIAGNOSIS — E1165 Type 2 diabetes mellitus with hyperglycemia: Secondary | ICD-10-CM | POA: Diagnosis not present

## 2018-12-16 DIAGNOSIS — I10 Essential (primary) hypertension: Secondary | ICD-10-CM | POA: Diagnosis not present

## 2018-12-16 DIAGNOSIS — C183 Malignant neoplasm of hepatic flexure: Secondary | ICD-10-CM | POA: Diagnosis not present

## 2018-12-16 DIAGNOSIS — Z5111 Encounter for antineoplastic chemotherapy: Secondary | ICD-10-CM | POA: Diagnosis not present

## 2018-12-16 DIAGNOSIS — C19 Malignant neoplasm of rectosigmoid junction: Secondary | ICD-10-CM | POA: Diagnosis not present

## 2018-12-16 DIAGNOSIS — I110 Hypertensive heart disease with heart failure: Secondary | ICD-10-CM | POA: Diagnosis not present

## 2018-12-16 DIAGNOSIS — E119 Type 2 diabetes mellitus without complications: Secondary | ICD-10-CM | POA: Diagnosis not present

## 2018-12-16 DIAGNOSIS — D509 Iron deficiency anemia, unspecified: Secondary | ICD-10-CM | POA: Diagnosis not present

## 2018-12-16 MED ORDER — FUROSEMIDE 10 MG/ML IJ SOLN
40.0000 mg | Freq: Once | INTRAMUSCULAR | Status: AC
  Administered 2018-12-16: 40 mg via INTRAVENOUS

## 2018-12-16 MED ORDER — GERHARDT'S BUTT CREAM: 1.0000 "application " | TOPICAL_CREAM | Freq: Four times a day (QID) | CUTANEOUS | 3 refills | Status: DC | PRN

## 2018-12-16 MED ORDER — DEXAMETHASONE SODIUM PHOSPHATE 10 MG/ML IJ SOLN
INTRAMUSCULAR | Status: AC
  Filled 2018-12-16: qty 1

## 2018-12-16 MED ORDER — FUROSEMIDE 10 MG/ML IJ SOLN
INTRAMUSCULAR | Status: AC
  Filled 2018-12-16: qty 4

## 2018-12-16 MED ORDER — SODIUM CHLORIDE 0.9 % IV SOLN
Freq: Once | INTRAVENOUS | Status: AC
  Administered 2018-12-16: 08:00:00 via INTRAVENOUS
  Filled 2018-12-16: qty 250

## 2018-12-16 MED ORDER — SODIUM CHLORIDE 0.9 % IV SOLN
75.0000 mg/m2 | Freq: Once | INTRAVENOUS | Status: AC
  Administered 2018-12-16: 190 mg via INTRAVENOUS
  Filled 2018-12-16: qty 9.5

## 2018-12-16 MED ORDER — DEXAMETHASONE SODIUM PHOSPHATE 10 MG/ML IJ SOLN
10.0000 mg | Freq: Once | INTRAMUSCULAR | Status: AC
  Administered 2018-12-16: 10 mg via INTRAVENOUS

## 2018-12-16 MED ORDER — NYSTATIN 100000 UNIT/GM EX POWD: Freq: Two times a day (BID) | CUTANEOUS | 0 refills | Status: DC

## 2018-12-16 NOTE — Patient Instructions (Signed)
[facility] Discharge Instructions for Patients Receiving Chemotherapy  Today you received the following chemotherapy agents: etoposide  To help prevent nausea and vomiting after your treatment, we encourage you to take your nausea medication as directed.   If you develop nausea and vomiting that is not controlled by your nausea medication, call the clinic.   BELOW ARE SYMPTOMS THAT SHOULD BE REPORTED IMMEDIATELY:  *FEVER GREATER THAN 100.5 F  *CHILLS WITH OR WITHOUT FEVER  NAUSEA AND VOMITING THAT IS NOT CONTROLLED WITH YOUR NAUSEA MEDICATION  *UNUSUAL SHORTNESS OF BREATH  *UNUSUAL BRUISING OR BLEEDING  TENDERNESS IN MOUTH AND THROAT WITH OR WITHOUT PRESENCE OF ULCERS  *URINARY PROBLEMS  *BOWEL PROBLEMS  UNUSUAL RASH Items with * indicate a potential emergency and should be followed up as soon as possible.  Feel free to call the clinic should you have any questions or concerns. The clinic phone number is [phone].  Please show the CHEMO ALERT CARD at check-in to the Emergency Department and triage nurse.   

## 2018-12-16 NOTE — Telephone Encounter (Signed)
Faxed script for rollator walker large size to [facility] on 12/09/2018, sent to HIM for scan.

## 2018-12-17 ENCOUNTER — Ambulatory Visit
Admission: RE | Admit: 2018-12-17 | Discharge: 2018-12-17 | Disposition: A | Discharge status: Home / Self Care | Payer: Medicare Other | Source: Ambulatory Visit | Attending: Radiation Oncology | Admitting: Radiation Oncology

## 2018-12-17 ENCOUNTER — Ambulatory Visit: Payer: Medicare Other

## 2018-12-17 ENCOUNTER — Other Ambulatory Visit: Payer: Self-pay

## 2018-12-17 ENCOUNTER — Inpatient Hospital Stay: Payer: Medicare Other

## 2018-12-17 VITALS — BP 108/60 | HR 72 | Temp 98.3°F | Resp 20

## 2018-12-17 DIAGNOSIS — C3412 Malignant neoplasm of upper lobe, left bronchus or lung: Secondary | ICD-10-CM

## 2018-12-17 DIAGNOSIS — Z7982 Long term (current) use of aspirin: Secondary | ICD-10-CM | POA: Diagnosis not present

## 2018-12-17 DIAGNOSIS — C19 Malignant neoplasm of rectosigmoid junction: Secondary | ICD-10-CM | POA: Diagnosis not present

## 2018-12-17 DIAGNOSIS — Z510 Encounter for antineoplastic radiation therapy: Secondary | ICD-10-CM | POA: Diagnosis not present

## 2018-12-17 DIAGNOSIS — C3490 Malignant neoplasm of unspecified part of unspecified bronchus or lung: Secondary | ICD-10-CM | POA: Diagnosis not present

## 2018-12-17 DIAGNOSIS — D61810 Antineoplastic chemotherapy induced pancytopenia: Secondary | ICD-10-CM | POA: Diagnosis not present

## 2018-12-17 DIAGNOSIS — C183 Malignant neoplasm of hepatic flexure: Secondary | ICD-10-CM | POA: Diagnosis not present

## 2018-12-17 DIAGNOSIS — R2681 Unsteadiness on feet: Secondary | ICD-10-CM | POA: Diagnosis not present

## 2018-12-17 DIAGNOSIS — Z79899 Other long term (current) drug therapy: Secondary | ICD-10-CM | POA: Diagnosis not present

## 2018-12-17 DIAGNOSIS — I110 Hypertensive heart disease with heart failure: Secondary | ICD-10-CM | POA: Diagnosis not present

## 2018-12-17 DIAGNOSIS — T451X5A Adverse effect of antineoplastic and immunosuppressive drugs, initial encounter: Secondary | ICD-10-CM | POA: Diagnosis not present

## 2018-12-17 DIAGNOSIS — I10 Essential (primary) hypertension: Secondary | ICD-10-CM | POA: Diagnosis not present

## 2018-12-17 DIAGNOSIS — Z8619 Personal history of other infectious and parasitic diseases: Secondary | ICD-10-CM | POA: Diagnosis not present

## 2018-12-17 DIAGNOSIS — I2510 Atherosclerotic heart disease of native coronary artery without angina pectoris: Secondary | ICD-10-CM | POA: Diagnosis not present

## 2018-12-17 DIAGNOSIS — I509 Heart failure, unspecified: Secondary | ICD-10-CM | POA: Diagnosis not present

## 2018-12-17 DIAGNOSIS — E119 Type 2 diabetes mellitus without complications: Secondary | ICD-10-CM | POA: Diagnosis not present

## 2018-12-17 DIAGNOSIS — Z794 Long term (current) use of insulin: Secondary | ICD-10-CM | POA: Diagnosis not present

## 2018-12-17 DIAGNOSIS — D509 Iron deficiency anemia, unspecified: Secondary | ICD-10-CM | POA: Diagnosis not present

## 2018-12-17 DIAGNOSIS — Z5111 Encounter for antineoplastic chemotherapy: Secondary | ICD-10-CM | POA: Diagnosis not present

## 2018-12-17 DIAGNOSIS — E039 Hypothyroidism, unspecified: Secondary | ICD-10-CM | POA: Diagnosis not present

## 2018-12-17 DIAGNOSIS — M069 Rheumatoid arthritis, unspecified: Secondary | ICD-10-CM | POA: Diagnosis not present

## 2018-12-17 MED ORDER — SODIUM CHLORIDE 0.9 % IV SOLN
Freq: Once | INTRAVENOUS | Status: AC
  Administered 2018-12-17: 09:00:00 via INTRAVENOUS
  Filled 2018-12-17: qty 250

## 2018-12-17 MED ORDER — DEXAMETHASONE SODIUM PHOSPHATE 10 MG/ML IJ SOLN
10.0000 mg | Freq: Once | INTRAMUSCULAR | Status: AC
  Administered 2018-12-17: 10 mg via INTRAVENOUS

## 2018-12-17 MED ORDER — FUROSEMIDE 10 MG/ML IJ SOLN
40.0000 mg | Freq: Once | INTRAMUSCULAR | Status: AC
  Administered 2018-12-17: 40 mg via INTRAVENOUS

## 2018-12-17 MED ORDER — SODIUM CHLORIDE 0.9 % IV SOLN
75.0000 mg/m2 | Freq: Once | INTRAVENOUS | Status: AC
  Administered 2018-12-17: 190 mg via INTRAVENOUS
  Filled 2018-12-17: qty 9.5

## 2018-12-17 MED ORDER — DEXAMETHASONE SODIUM PHOSPHATE 10 MG/ML IJ SOLN
INTRAMUSCULAR | Status: AC
  Filled 2018-12-17: qty 1

## 2018-12-17 MED ORDER — FUROSEMIDE 10 MG/ML IJ SOLN
INTRAMUSCULAR | Status: AC
  Filled 2018-12-17: qty 4

## 2018-12-17 NOTE — Patient Instructions (Signed)
[facility] Discharge Instructions for Patients Receiving Chemotherapy  Today you received the following chemotherapy agents: etoposide  To help prevent nausea and vomiting after your treatment, we encourage you to take your nausea medication as directed.   If you develop nausea and vomiting that is not controlled by your nausea medication, call the clinic.   BELOW ARE SYMPTOMS THAT SHOULD BE REPORTED IMMEDIATELY:  *FEVER GREATER THAN 100.5 F  *CHILLS WITH OR WITHOUT FEVER  NAUSEA AND VOMITING THAT IS NOT CONTROLLED WITH YOUR NAUSEA MEDICATION  *UNUSUAL SHORTNESS OF BREATH  *UNUSUAL BRUISING OR BLEEDING  TENDERNESS IN MOUTH AND THROAT WITH OR WITHOUT PRESENCE OF ULCERS  *URINARY PROBLEMS  *BOWEL PROBLEMS  UNUSUAL RASH Items with * indicate a potential emergency and should be followed up as soon as possible.  Feel free to call the clinic should you have any questions or concerns. The clinic phone number is [phone].  Please show the CHEMO ALERT CARD at check-in to the Emergency Department and triage nurse.   

## 2018-12-18 DIAGNOSIS — I130 Hypertensive heart and chronic kidney disease with heart failure and stage 1 through stage 4 chronic kidney disease, or unspecified chronic kidney disease: Secondary | ICD-10-CM | POA: Diagnosis not present

## 2018-12-18 DIAGNOSIS — I5022 Chronic systolic (congestive) heart failure: Secondary | ICD-10-CM | POA: Diagnosis not present

## 2018-12-18 DIAGNOSIS — L02211 Cutaneous abscess of abdominal wall: Secondary | ICD-10-CM | POA: Diagnosis not present

## 2018-12-18 DIAGNOSIS — I5033 Acute on chronic diastolic (congestive) heart failure: Secondary | ICD-10-CM | POA: Diagnosis not present

## 2018-12-18 DIAGNOSIS — E1122 Type 2 diabetes mellitus with diabetic chronic kidney disease: Secondary | ICD-10-CM | POA: Diagnosis not present

## 2018-12-20 ENCOUNTER — Ambulatory Visit: Payer: Medicare Other

## 2018-12-20 ENCOUNTER — Ambulatory Visit
Admission: RE | Admit: 2018-12-20 | Discharge: 2018-12-20 | Disposition: A | Discharge status: Home / Self Care | Payer: Medicare Other | Source: Ambulatory Visit | Attending: Radiation Oncology | Admitting: Radiation Oncology

## 2018-12-20 ENCOUNTER — Other Ambulatory Visit: Payer: Self-pay

## 2018-12-20 DIAGNOSIS — E039 Hypothyroidism, unspecified: Secondary | ICD-10-CM | POA: Diagnosis not present

## 2018-12-20 DIAGNOSIS — I509 Heart failure, unspecified: Secondary | ICD-10-CM | POA: Diagnosis not present

## 2018-12-20 DIAGNOSIS — C3490 Malignant neoplasm of unspecified part of unspecified bronchus or lung: Secondary | ICD-10-CM | POA: Diagnosis not present

## 2018-12-20 DIAGNOSIS — I110 Hypertensive heart disease with heart failure: Secondary | ICD-10-CM | POA: Diagnosis not present

## 2018-12-20 DIAGNOSIS — C3412 Malignant neoplasm of upper lobe, left bronchus or lung: Secondary | ICD-10-CM | POA: Diagnosis not present

## 2018-12-20 DIAGNOSIS — Z510 Encounter for antineoplastic radiation therapy: Secondary | ICD-10-CM | POA: Diagnosis not present

## 2018-12-20 DIAGNOSIS — I2510 Atherosclerotic heart disease of native coronary artery without angina pectoris: Secondary | ICD-10-CM | POA: Diagnosis not present

## 2018-12-20 DIAGNOSIS — E119 Type 2 diabetes mellitus without complications: Secondary | ICD-10-CM | POA: Diagnosis not present

## 2018-12-20 DIAGNOSIS — C19 Malignant neoplasm of rectosigmoid junction: Secondary | ICD-10-CM | POA: Diagnosis not present

## 2018-12-20 DIAGNOSIS — D509 Iron deficiency anemia, unspecified: Secondary | ICD-10-CM | POA: Diagnosis not present

## 2018-12-20 NOTE — Progress Notes (Signed)
[place]   [phone] [phone]   Clinic Follow up Note   Patient Care Team: [name], MD as PCP - General (Internal Medicine) Herminio Commons, MD as PCP - Cardiology (Cardiology) Neldon Labella, RN as [place] [facility] Truitt Merle, MD as Consulting Physician (Hematology)  Date of Service:  12/22/2018  CHIEF COMPLAINT: F/u ofright colon cancerand Small cell lung cancer  SUMMARY OF ONCOLOGIC HISTORY: Oncology History Overview Note  Cancer Staging Primary cancer of hepatic flexure of colon [facility]) Staging form: Colon and Rectum, AJCC 8th Edition - Clinical stage from 09/09/2018: Stage Unknown (cTX, cN0, cM0) - Signed by Truitt Merle, MD on 09/28/2018  Small cell lung cancer, left upper lobe ([place]) Staging form: Lung, AJCC 8th Edition - Clinical stage from 09/14/2018: Stage IIIB (cT3, cN2, cM0) - Signed by Truitt Merle, MD on 09/28/2018     Primary cancer of hepatic flexure of colon ([place]) (Resolved)   Initial Diagnosis   Malignant neoplasm of hepatic flexure ([place])   09/09/2018 Procedure   Coloscopy by Dr Gala Romney 09/09/18  IMPRESSION -Colon mass as described in the vicinity of the hepatic flexure - status post biopsy and inking. Polyps associated with this tumor scattered all the way to the cecum were not removed; multiple splenic flexure and rectal polyps were removed as described above. Left-sided diverticulosis.  Upper endoscopy By Dr Gala Romney 09/09/18  IMPRESSION -Abnormal distal esophagus suspicious for Barrett's esophagus?biopsied. - Small hiatal hernia. - Erosive gastropathy. Biopsied. -Duodenal bulbar erosions   09/09/2018 Initial Biopsy   Diagnosis 09/09/18 1. Stomach, biopsy - GASTRIC ANTRAL MUCOSA WITH MILD NONSPECIFIC REACTIVE GASTROPATHY - GASTRIC OXYNTIC MUCOSA WITH PARIETAL CELL HYPERPLASIA AS CAN BE SEEN IN HYPERGASTRINEMIC STATES SUCH AS PROPER PATIENT IDENTIFICATION THERAPY. - WARTHIN STARRY STAIN IS  NEGATIVE FOR HELICOBACTER PYLORI 2. Esophagus, biopsy - BARRETT'S ESOPHAGUS, NEGATIVE FOR DYSPLASIA 3. Colon, polyp(s), descending - TUBULAR ADENOMA - NEGATIVE FOR HIGH-GRADE DYSPLASIA OR MALIGNANCY 4. Colon, biopsy, hepatic flexure - ADENOCARCINOMA. SEE NOTE 5. Colon, polyp(s), splenic flexure - ADENOCARCINOMA. SEE NOTE - OTHER FRAGMENTS OF TUBULAR ADENOMA(S) WITH ONE SHOWING HIGH-GRADE DYSPLASIA 6. Rectum, polyp(s) - TUBULAR ADENOMA(S) - NEGATIVE FOR HIGH-GRADE DYSPLASIA OR MALIGNANCY   09/09/2018 Cancer Staging   Staging form: Colon and Rectum, AJCC 8th Edition - Clinical stage from 09/09/2018: Stage Unknown (cTX, cN0, cM0) - Signed by Truitt Merle, MD on 09/28/2018   09/11/2018 Imaging   CT CAP 09/11/18  IMPRESSION: 1. Central perihilar left upper lobe 5.4 cm lung mass, new since 01/27/2017 chest CT, compatible with malignancy, favor primary bronchogenic carcinoma. Postobstructive pneumonia/atelectasis in the lingula. 2. Mediastinal adenopathy in the left prevascular/AP window and subcarinal stations, compatible with metastatic nodes (more likely originating from the left upper lobe lung mass). 3. Trace dependent left pleural effusion. 4. Apple-core lesion in the ascending colon, compatible with known primary colonic neoplasm. Adjacent top-normal size right mesenteric lymph node is equivocal for locoregional nodal metastasis. No additional potential findings of metastatic disease in the abdomen or pelvis. 5. Chronic findings include: Aortic Atherosclerosis (ICD10-I70.0). Borderline mild cardiomegaly. One vessel coronary atherosclerosis. Dilated main pulmonary artery, suggesting pulmonary arterial hypertension. Left adrenal adenoma. Punctate bilateral nephrolithiasis. Small hiatal hernia.   09/20/2018 Imaging   Brain MRI 09/20/18  IMPRESSION: 1. Motion degraded examination without brain metastases identified. 2. Small skull metastases not excluded. 3. Moderate to severe chronic  small vessel ischemic disease.   Small cell lung cancer, left upper lobe ([place])  09/11/2018 Imaging   CT  CAP 09/11/18  IMPRESSION: 1. Central perihilar left upper lobe 5.4 cm lung mass, new since 01/27/2017 chest CT, compatible with malignancy, favor primary bronchogenic carcinoma. Postobstructive pneumonia/atelectasis in the lingula. 2. Mediastinal adenopathy in the left prevascular/AP window and subcarinal stations, compatible with metastatic nodes (more likely originating from the left upper lobe lung mass). 3. Trace dependent left pleural effusion. 4. Apple-core lesion in the ascending colon, compatible with known primary colonic neoplasm. Adjacent top-normal size right mesenteric lymph node is equivocal for locoregional nodal metastasis. No additional potential findings of metastatic disease in the abdomen or pelvis. 5. Chronic findings include: Aortic Atherosclerosis (ICD10-I70.0). Borderline mild cardiomegaly. One vessel coronary atherosclerosis. Dilated main pulmonary artery, suggesting pulmonary arterial hypertension. Left adrenal adenoma. Punctate bilateral nephrolithiasis. Small hiatal hernia.    09/14/2018 Initial Biopsy   Diagnosis 5/5.20 Lung, biopsy, LUL - SMALL CELL CARCINOMA.   09/14/2018 Cancer Staging   Staging form: Lung, AJCC 8th Edition - Clinical stage from 09/14/2018: Stage IIIB (cT3, cN2, cM0) - Signed by Truitt Merle, MD on 09/28/2018   09/15/2018 Initial Diagnosis   Small cell lung cancer, left upper lobe ([place])   09/18/2018 - 12/17/2018 Chemotherapy   Carboplatin and etoposide every 3 weeks for 4 cycles starting 09/18/18. Held after C2D1 due to COVID-19(+). She restarted with cycle 3 on 11/03/18. Completed 5 cycles on 12/17/18.  RT was concurrnet but started on 11/24/18.   09/20/2018 Imaging   Brain MRI 09/20/18  IMPRESSION: 1. Motion degraded examination without brain metastases identified. 2. Small skull metastases not excluded. 3. Moderate to severe chronic small  vessel ischemic disease.    11/24/2018 -  Radiation Therapy    RT was postponed to 11/24/18. Only concurrent with chemo for 2 cycles. Plan to complete on 01/06/19      CURRENT THERAPY:  -Carboplatin and etoposideevery3weeks for 4 cycles starting 09/18/18. Held afterC2D1due to COVID-19(+). She restarted with cycle3on 11/03/18.Completed 5 cycles on 12/17/18. -RTstarted7/15/20, plan to complete on 01/06/19  INTERVAL HISTORY:  KOREENA JOOST is here for a follow up. She presents to the clinic alone in a wheelchair.  She still has 2 more weeks of radiation to go.  She is more fatigued, but is still able to do her ADLs at home.  She noticed a blister in the left inner breast, possibly related to her radiation.  Her odynophagia has improved with medication.  Her appetite is fair, leg swelling stable.  No other new complaints.    REVIEW OF SYSTEMS:   Constitutional: Denies fevers, chills or abnormal weight loss, (+) fatigue  Ears, nose, mouth, throat, and face: Denies mucositis or sore throat Respiratory: stable dry cough  Cardiovascular: Denies palpitation, chest discomfort or lower extremity swelling Gastrointestinal:  Denies nausea, heartburn or change in bowel habits Skin: (+) Skin blister in the inner upper left breast Lymphatics: Denies new lymphadenopathy or easy bruising Neurological:Denies numbness, tingling or new weaknesses Behavioral/Psych: Mood is stable, no new changes  All other systems were reviewed with the patient and are negative.  MEDICAL HISTORY:  Past Medical History:  Diagnosis Date  . Anxiety   . CAD (coronary artery disease)    a. s/p DES to LAD and angioplasty to D1 in 05/2017  . CHF (congestive heart failure) (HCC)    Diastolic  . Chronic back pain   . Chronic pain   . COPD (chronic obstructive pulmonary disease) ([place])   . Degenerative disk disease   . Diabetes mellitus without complication ([place])   . History of medication  noncompliance 11/2017   "the  patient frequently self-adjusts medications without notifying her physicians"  . Hypertension   . Hypothyroidism   . On home O2    2.5 L N/C prn  . Pedal edema     SURGICAL HISTORY: Past Surgical History:  Procedure Laterality Date  . BIOPSY  09/09/2018   Procedure: BIOPSY;  Surgeon: Daneil Dolin, MD;  [facility];  Service: Endoscopy;;  gastric esophageal hepatic flexure colon  . COLONOSCOPY WITH PROPOFOL N/A 09/09/2018   Procedure: COLONOSCOPY WITH PROPOFOL;  Surgeon: Daneil Dolin, MD;  [facility];  Service: Endoscopy;  Laterality: N/A;  . CORONARY STENT INTERVENTION N/A 05/22/2017   Procedure: CORONARY STENT INTERVENTION;  Surgeon: Martinique, Peter M, MD;  Location: [place] [facility];  Service: Cardiovascular;  Laterality: N/A;  . ESOPHAGOGASTRODUODENOSCOPY (EGD) WITH PROPOFOL N/A 09/09/2018   Procedure: ESOPHAGOGASTRODUODENOSCOPY (EGD) WITH PROPOFOL;  Surgeon: Daneil Dolin, MD;  [facility];  Service: Endoscopy;  Laterality: N/A;  . HERNIA REPAIR    . POLYPECTOMY  09/09/2018   Procedure: POLYPECTOMY;  Surgeon: Daneil Dolin, MD;  [facility];  Service: Endoscopy;;  colon  . RIGHT/LEFT HEART CATH AND CORONARY ANGIOGRAPHY N/A 05/19/2017   Procedure: RIGHT/LEFT HEART CATH AND CORONARY ANGIOGRAPHY;  Surgeon: Leonie Man, MD;  Location: [place] [facility];  Service: Cardiovascular;  Laterality: N/A;  . TUBAL LIGATION    . VIDEO BRONCHOSCOPY WITH ENDOBRONCHIAL ULTRASOUND Left 09/14/2018   Procedure: VIDEO BRONCHOSCOPY WITH ENDOBRONCHIAL ULTRASOUND with biopsies;  Surgeon: Margaretha Seeds, MD;  Location: [place];  Service: Thoracic;  Laterality: Left;    I have reviewed the social history and family history with the patient and they are unchanged from previous note.  ALLERGIES:  is allergic to dilaudid [hydromorphone hcl]; midodrine hcl; actifed cold-allergy [chlorpheniramine-phenylephrine]; doxycycline; other; penicillins; reglan  [metoclopramide]; valium [diazepam]; and vistaril [hydroxyzine hcl].  MEDICATIONS:  Current Outpatient Medications  Medication Sig Dispense Refill  . albuterol (PROAIR HFA) 108 (90 Base) MCG/ACT inhaler Inhale 2 puffs into the lungs every 4 (four) hours as needed for wheezing or shortness of breath. For shortness of breath 18 g 0  . albuterol (PROVENTIL) (2.5 MG/3ML) 0.083% nebulizer solution Take 2.5 mg by nebulization 2 (two) times daily as needed for wheezing or shortness of breath.     . allopurinol (ZYLOPRIM) 300 MG tablet Take 1 tablet (300 mg total) by mouth daily for 15 days. 30 tablet 3  . ALPRAZolam (XANAX) 1 MG tablet Take 1 mg by mouth 3 (three) times daily as needed for anxiety or sleep.     Marland Kitchen aspirin EC 81 MG tablet Take 81 mg by mouth daily.    Marland Kitchen atorvastatin (LIPITOR) 80 MG tablet Take 1 tablet (80 mg total) by mouth daily at 6 PM. 30 tablet 0  . cholecalciferol (VITAMIN D3) 25 MCG (1000 UT) tablet Take 5,000 Units by mouth daily.    . furosemide (LASIX) 40 MG tablet Take 60 mg (1 1/2 tabs) in the morning and 40 mg (1 tab) in the afternoon 75 tablet 6  . gabapentin (NEURONTIN) 600 MG tablet Take 600 mg by mouth 3 (three) times daily.     . Hydrocortisone (GERHARDT'S BUTT CREAM) CREA Apply 1 application topically 4 (four) times daily as needed for irritation. 1 each 3  . insulin aspart (NOVOLOG) 100 UNIT/ML injection Inject 1-18 Units into the skin 3 (three) times daily with meals. (Patient taking differently: Inject 0-20  Units into the skin 4 (four) times daily -  before meals and at bedtime. )    . insulin detemir (LEVEMIR) 100 UNIT/ML injection Inject 0.4 mLs (40 Units total) into the skin at bedtime. 10 mL 0  . levothyroxine (SYNTHROID, LEVOTHROID) 200 MCG tablet Take 1 tablet by mouth daily before breakfast.     . nitroGLYCERIN (NITROSTAT) 0.4 MG SL tablet Place 0.4 mg under the tongue every 5 (five) minutes as needed for chest pain.     Marland Kitchen nystatin (MYCOSTATIN/NYSTOP) powder  Apply topically 2 (two) times daily. 15 g 0  . Omega-3 Fatty Acids (FISH OIL PO) Take 1 capsule by mouth daily.     . ondansetron (ZOFRAN) 8 MG tablet Take 1 tablet (8 mg total) by mouth every 8 (eight) hours as needed for nausea or vomiting. 120 tablet 2  . oxyCODONE-acetaminophen (PERCOCET) 10-325 MG tablet Take 1 tablet by mouth every 6 (six) hours as needed for pain.    . OXYGEN Inhale 2 L into the lungs continuous.    . pantoprazole (PROTONIX) 40 MG tablet Take 1 tablet (40 mg total) by mouth 2 (two) times daily. 180 tablet 3  . polyethylene glycol (MIRALAX / GLYCOLAX) 17 g packet Take 17 g by mouth 2 (two) times daily. 14 each 0  . potassium chloride (MICRO-K) 10 MEQ CR capsule Take 2 capsules (20 mEq total) by mouth 2 (two) times daily. *May take one additional tablet as needed for cramping 120 capsule 3  . prochlorperazine (COMPAZINE) 10 MG tablet Take 1 tablet (10 mg total) by mouth every 6 (six) hours as needed for nausea or vomiting. 30 tablet 0  . promethazine (PHENERGAN) 12.5 MG tablet Take 1 tablet (12.5 mg total) by mouth every 6 (six) hours as needed for nausea or vomiting. 45 tablet 1  . senna (SENOKOT) 8.6 MG TABS tablet Take 2 tablets (17.2 mg total) by mouth 2 (two) times daily with a meal. 120 each 0  . sucralfate (CARAFATE) 1 g tablet Take 1 tablet (1 g total) by mouth 4 (four) times daily -  with meals and at bedtime. 5 min before meals for radiation induced esophagitis 120 tablet 2  . SURE COMFORT INS SYR 1CC/28G 28G X 1/2" 1 ML MISC USE TO INJECT INSULIN UP TO 4 TIMES DAILY. 150 each 5  . tiZANidine (ZANAFLEX) 4 MG tablet Take 1 tablet (4 mg total) by mouth every 8 (eight) hours as needed for muscle spasms. 30 tablet 0  . topiramate (TOPAMAX) 25 MG tablet Take 75 mg by mouth at bedtime.     Marland Kitchen umeclidinium-vilanterol (ANORO ELLIPTA) 62.5-25 MCG/INH AEPB Inhale 1 puff into the lungs daily. 30 each 0   No current facility-administered medications for this visit.     PHYSICAL  EXAMINATION: ECOG PERFORMANCE STATUS: 2 - Symptomatic, <50% confined to bed  Vitals:   12/22/18 1208  BP: 132/62  Pulse: 98  Resp: 18  Temp: 98 F (36.7 C)  SpO2: 98%   Filed Weights   12/22/18 1208  Weight: 281 lb (127.5 kg)   GENERAL:alert, no distress and comfortable SKIN: skin color, texture, turgor are normal, no rashes or significant lesions except a shallow ulcer at left inner breast  EYES: normal, Conjunctiva are pink and non-injected, sclera clear NECK: supple, thyroid normal size, non-tender, without nodularity LYMPH:  no palpable lymphadenopathy in the cervical, axillary  LUNGS: clear to auscultation and percussion with normal breathing effort HEART: regular rate & rhythm and no murmurs and  no lower extremity edema ABDOMEN:abdomen soft, non-tender and normal bowel sounds Musculoskeletal:no cyanosis of digits and no clubbing, (+) pitting edema and skin erythema/hyperpigmentation in bilateral lower extremity NEURO: alert & oriented x 3 with fluent speech, no focal motor/sensory deficits  LABORATORY DATA:  I have reviewed the data as listed CBC Latest Ref Rng & Units 12/22/2018 12/15/2018 12/13/2018  WBC 4.0 - 10.5 K/uL 4.1 4.0 2.8(L)  Hemoglobin 12.0 - 15.0 g/dL 8.7(L) 8.8(L) 8.9(L)  Hematocrit 36.0 - 46.0 % 26.9(L) 28.9(L) 28.7(L)  Platelets 150 - 400 K/uL 203 273 236     CMP Latest Ref Rng & Units 12/22/2018 12/15/2018 12/13/2018  Glucose 70 - 99 mg/dL 236(H) 190(H) 231(H)  BUN 8 - 23 mg/dL 25(H) 25(H) 18  Creatinine 0.44 - 1.00 mg/dL 1.41(H) 1.85(H) 1.78(H)  Sodium 135 - 145 mmol/L 139 142 140  Potassium 3.5 - 5.1 mmol/L 4.2 3.8 4.2  Chloride 98 - 111 mmol/L 103 104 102  CO2 22 - 32 mmol/L _0 Calcium 8.9 - 10.3 mg/dL 8.7(L) 8.5(L) 9.2  Total Protein 6.5 - 8.1 g/dL 6.2(L) 6.1(L) 6.4(L)  Total Bilirubin 0.3 - 1.2 mg/dL 0.7 0.3 0.4  Alkaline Phos 38 - 126 U/L 98 105 107  AST 15 - 41 U/L 12(L) 11(L) 13(L)  ALT 0 - 44 U/L _1 RADIOGRAPHIC STUDIES: I  have personally reviewed the radiological images as listed and agreed with the findings in the report. No results found.   ASSESSMENT & PLAN:  Stephanie Combs is a 68 y.o. female with   1. Small Cell Lung cancer, LUL, cT3N2M0, stage III, limited stage  -Newly diagnosedin 5/2020during her work up for colon cancer -Ipreviouslydiscussed her lung cancer would take priority in terms of treatment given theaggressivenessof small cell lung cancer, and very high risk of recurrence evenwith curative intentconcurrentchemoradiation.  -She completed last cycle chemo on 8/7, with significant dose reduction.she still has 2 more weeks of radiation to complete.  Tolerating moderately well. She has developed more fatigue and signs of radiation esophagitis and skin toxicities.  -Labs reviewed, CBC and CMP WNL except HG 8.7, BG 236, BUN 25, Cr 1.41, Ca 8.7, Protein 6.2, albumin 3.2. overall stable -She will continue daily radiation, I will see her back in 1 week   2. 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  -Unfortunately she is not a candidate for colon cancer surgery due to very high risk ofsurgical mortality.She was seen by surgery during her recent hospitalization.  -I discussed the incurablenature of her colon cancer without surgery. -Willcontinueto monitor on current treatment for her SCLC. -if she recovers from [facility] for [facility], will consider low intensity chemo such as Xeloda or Irinotecan (which works for [employer] also). Given normal MMR, she is not a candidate for immunotherapy. Given primary right side colon cancer, not much benefit from EGFR inhibitors even if KRAS/NRAS/BRAF wild type.  3. 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 -anemia slightly  worse due to chemo -Continue monitoring -Last blood trasnfusion on 12/10/18.  -hg8.7 today (12/22/18), no need blood transfusion   4. CAD, CHF, HTN, significant LE edema, CKD -OnLipitor, lasix, nitro -Continue to f/u with cardiologist. -Her CHFand fluid overload has muchimproved during her recent hospitalization. -She has been on 59m Lasix in the morning.She responded well to iv lasixwhich I give  during his chemo -I held her oral lasix and gave iv lasix 16mdailyduring chemowhich she responded very well  5.DM,hypothyroidism, morbid obesity -On insulin novolog and long acting levemir. OnLevothyroxine. -Willmonitor glucose closely during her chemo treatment -stable  6.Rheumatoidarthritis, chronic back pain  -On Percocet which she takes 4 times a day.  -She is also hasGabapentin.  -She use to receiveinjectionfor her back -Pain chronic, stable. She will continue to see her RA and pain specialist.   7. Depression and anxiety -She has been more depressed with recent diagnosis of 2 cancers -She is currently on Celebrex, will continue. -stable   8.Goal of care discussion, DNR/DNI -We previously discussed the incurable nature of her cancer, and the overall poor prognosis, especially ifshe does not have good response to chemotherapy or progress on chemo -The patient understands the goal of care is palliative. -I recommend DNR/DNI,she has agreed to DRI/DNR.She has written will and has established her daughter as her POA.  9.History ofCOVID-19 -She was incidentally found to be positive for COVID-19on 10/11/2018, no respiratory symptoms or evidence of pneumonia,she hasGIsymptoms which could be relate -Her testing on 11/04/18 was positive again. -no COVID symptoms    Plan: -lab reviewed, stable  -Continue RT -Lab and follow-up weekly until she completes radiation   No problem-specific Assessment & Plan notes found for this encounter.    No orders of the defined types were placed in this encounter.  All questions were answered. The patient knows to call the clinic with any problems, questions or concerns. No barriers to learning was detected. I spent 15 minutes counseling the patient face to face. The total time spent in the appointment was 20 minutes and more than 50% was on counseling and review of test results     YTruitt Merle MD 12/22/2018   I, AJoslyn Devon am acting as scribe for YTruitt Merle MD.   I have reviewed the above documentation for accuracy and completeness, and I agree with the above.

## 2018-12-21 ENCOUNTER — Telehealth: Payer: Self-pay | Admitting: *Deleted

## 2018-12-21 ENCOUNTER — Ambulatory Visit: Payer: Medicare Other

## 2018-12-21 ENCOUNTER — Ambulatory Visit
Admission: RE | Admit: 2018-12-21 | Discharge: 2018-12-21 | Disposition: A | Discharge status: Home / Self Care | Payer: Medicare Other | Source: Ambulatory Visit | Attending: Radiation Oncology | Admitting: Radiation Oncology

## 2018-12-21 ENCOUNTER — Other Ambulatory Visit: Payer: Self-pay

## 2018-12-21 DIAGNOSIS — I110 Hypertensive heart disease with heart failure: Secondary | ICD-10-CM | POA: Diagnosis not present

## 2018-12-21 DIAGNOSIS — Z510 Encounter for antineoplastic radiation therapy: Secondary | ICD-10-CM | POA: Diagnosis not present

## 2018-12-21 DIAGNOSIS — E119 Type 2 diabetes mellitus without complications: Secondary | ICD-10-CM | POA: Diagnosis not present

## 2018-12-21 DIAGNOSIS — I2510 Atherosclerotic heart disease of native coronary artery without angina pectoris: Secondary | ICD-10-CM | POA: Diagnosis not present

## 2018-12-21 DIAGNOSIS — D509 Iron deficiency anemia, unspecified: Secondary | ICD-10-CM | POA: Diagnosis not present

## 2018-12-21 DIAGNOSIS — C3412 Malignant neoplasm of upper lobe, left bronchus or lung: Secondary | ICD-10-CM | POA: Diagnosis not present

## 2018-12-21 DIAGNOSIS — E039 Hypothyroidism, unspecified: Secondary | ICD-10-CM | POA: Diagnosis not present

## 2018-12-21 DIAGNOSIS — C19 Malignant neoplasm of rectosigmoid junction: Secondary | ICD-10-CM | POA: Diagnosis not present

## 2018-12-21 DIAGNOSIS — I509 Heart failure, unspecified: Secondary | ICD-10-CM | POA: Diagnosis not present

## 2018-12-21 DIAGNOSIS — C3490 Malignant neoplasm of unspecified part of unspecified bronchus or lung: Secondary | ICD-10-CM | POA: Diagnosis not present

## 2018-12-21 NOTE — Telephone Encounter (Signed)
Pt at front desk asking for brown envelope that was supposed to be left for her & a goodie bag from SW. [employer] informed pt that no envelope found. This nurse checked at Dr Ernestina Penna nurses desk, Alphonzo Grieve,  Laurence Compton,  & SW & no one knows about goodie bag.  Talked with pt & she wants 3 copies of her schedule for her rides & wants some scarves & neck pillows.  Gave pt 2 neck pillows, 2 scarves/hats, & a small fleece blanket & 3 copies of schedule.  Informed pt that we normally give pt's one pillow & ask them to bring with them each time since these & the other items are donated & we don't always have them.

## 2018-12-22 ENCOUNTER — Other Ambulatory Visit: Payer: Self-pay

## 2018-12-22 ENCOUNTER — Ambulatory Visit: Payer: Medicare Other

## 2018-12-22 ENCOUNTER — Ambulatory Visit
Admission: RE | Admit: 2018-12-22 | Discharge: 2018-12-22 | Disposition: A | Discharge status: Home / Self Care | Payer: Medicare Other | Source: Ambulatory Visit | Attending: Radiation Oncology | Admitting: Radiation Oncology

## 2018-12-22 ENCOUNTER — Inpatient Hospital Stay (HOSPITAL_BASED_OUTPATIENT_CLINIC_OR_DEPARTMENT_OTHER): Payer: Medicare Other | Admitting: Hematology

## 2018-12-22 ENCOUNTER — Inpatient Hospital Stay: Payer: Medicare Other

## 2018-12-22 ENCOUNTER — Encounter: Payer: Self-pay | Admitting: Hematology

## 2018-12-22 VITALS — BP 132/62 | HR 98 | Temp 98.0°F | Resp 18 | Ht 61.5 in | Wt 281.0 lb

## 2018-12-22 DIAGNOSIS — E119 Type 2 diabetes mellitus without complications: Secondary | ICD-10-CM | POA: Diagnosis not present

## 2018-12-22 DIAGNOSIS — I509 Heart failure, unspecified: Secondary | ICD-10-CM | POA: Diagnosis not present

## 2018-12-22 DIAGNOSIS — L98492 Non-pressure chronic ulcer of skin of other sites with fat layer exposed: Secondary | ICD-10-CM | POA: Diagnosis not present

## 2018-12-22 DIAGNOSIS — Z79899 Other long term (current) drug therapy: Secondary | ICD-10-CM | POA: Diagnosis not present

## 2018-12-22 DIAGNOSIS — C3412 Malignant neoplasm of upper lobe, left bronchus or lung: Secondary | ICD-10-CM | POA: Diagnosis not present

## 2018-12-22 DIAGNOSIS — N183 Chronic kidney disease, stage 3 unspecified: Secondary | ICD-10-CM

## 2018-12-22 DIAGNOSIS — C183 Malignant neoplasm of hepatic flexure: Secondary | ICD-10-CM | POA: Diagnosis not present

## 2018-12-22 DIAGNOSIS — E1122 Type 2 diabetes mellitus with diabetic chronic kidney disease: Secondary | ICD-10-CM | POA: Diagnosis not present

## 2018-12-22 DIAGNOSIS — M069 Rheumatoid arthritis, unspecified: Secondary | ICD-10-CM | POA: Diagnosis not present

## 2018-12-22 DIAGNOSIS — Z5111 Encounter for antineoplastic chemotherapy: Secondary | ICD-10-CM | POA: Diagnosis not present

## 2018-12-22 DIAGNOSIS — C3490 Malignant neoplasm of unspecified part of unspecified bronchus or lung: Secondary | ICD-10-CM | POA: Diagnosis not present

## 2018-12-22 DIAGNOSIS — T451X5A Adverse effect of antineoplastic and immunosuppressive drugs, initial encounter: Secondary | ICD-10-CM | POA: Diagnosis not present

## 2018-12-22 DIAGNOSIS — Z794 Long term (current) use of insulin: Secondary | ICD-10-CM

## 2018-12-22 DIAGNOSIS — Z8619 Personal history of other infectious and parasitic diseases: Secondary | ICD-10-CM | POA: Diagnosis not present

## 2018-12-22 DIAGNOSIS — I10 Essential (primary) hypertension: Secondary | ICD-10-CM

## 2018-12-22 DIAGNOSIS — N182 Chronic kidney disease, stage 2 (mild): Secondary | ICD-10-CM

## 2018-12-22 DIAGNOSIS — D61810 Antineoplastic chemotherapy induced pancytopenia: Secondary | ICD-10-CM | POA: Diagnosis not present

## 2018-12-22 DIAGNOSIS — I5032 Chronic diastolic (congestive) heart failure: Secondary | ICD-10-CM | POA: Diagnosis not present

## 2018-12-22 DIAGNOSIS — C19 Malignant neoplasm of rectosigmoid junction: Secondary | ICD-10-CM | POA: Diagnosis not present

## 2018-12-22 DIAGNOSIS — D509 Iron deficiency anemia, unspecified: Secondary | ICD-10-CM | POA: Diagnosis not present

## 2018-12-22 DIAGNOSIS — I110 Hypertensive heart disease with heart failure: Secondary | ICD-10-CM | POA: Diagnosis not present

## 2018-12-22 DIAGNOSIS — I2510 Atherosclerotic heart disease of native coronary artery without angina pectoris: Secondary | ICD-10-CM | POA: Diagnosis not present

## 2018-12-22 DIAGNOSIS — Z7982 Long term (current) use of aspirin: Secondary | ICD-10-CM | POA: Diagnosis not present

## 2018-12-22 DIAGNOSIS — Z510 Encounter for antineoplastic radiation therapy: Secondary | ICD-10-CM | POA: Diagnosis not present

## 2018-12-22 DIAGNOSIS — E039 Hypothyroidism, unspecified: Secondary | ICD-10-CM | POA: Diagnosis not present

## 2018-12-22 LAB — CBC WITH DIFFERENTIAL (CANCER CENTER ONLY)
Abs Immature Granulocytes: 0.07 10*3/uL (ref 0.00–0.07)
Basophils Absolute: 0.1 10*3/uL (ref 0.0–0.1)
Basophils Relative: 2 %
Eosinophils Absolute: 0 10*3/uL (ref 0.0–0.5)
Eosinophils Relative: 1 %
HCT: 26.9 % — ABNORMAL LOW (ref 36.0–46.0)
Hemoglobin: 8.7 g/dL — ABNORMAL LOW (ref 12.0–15.0)
Immature Granulocytes: 2 %
Lymphocytes Relative: 12 %
Lymphs Abs: 0.5 10*3/uL — ABNORMAL LOW (ref 0.7–4.0)
MCH: 32.1 pg (ref 26.0–34.0)
MCHC: 32.3 g/dL (ref 30.0–36.0)
MCV: 99.3 fL (ref 80.0–100.0)
Monocytes Absolute: 0.1 10*3/uL (ref 0.1–1.0)
Monocytes Relative: 2 %
Neutro Abs: 3.4 10*3/uL (ref 1.7–7.7)
Neutrophils Relative %: 81 %
Platelet Count: 203 10*3/uL (ref 150–400)
RBC: 2.71 MIL/uL — ABNORMAL LOW (ref 3.87–5.11)
RDW: 17.4 % — ABNORMAL HIGH (ref 11.5–15.5)
WBC Count: 4.1 10*3/uL (ref 4.0–10.5)
nRBC: 0 % (ref 0.0–0.2)

## 2018-12-22 LAB — CMP (CANCER CENTER ONLY)
ALT: 9 U/L (ref 0–44)
AST: 12 U/L — ABNORMAL LOW (ref 15–41)
Albumin: 3.2 g/dL — ABNORMAL LOW (ref 3.5–5.0)
Alkaline Phosphatase: 98 U/L (ref 38–126)
Anion gap: 10 (ref 5–15)
BUN: 25 mg/dL — ABNORMAL HIGH (ref 8–23)
CO2: 26 mmol/L (ref 22–32)
Calcium: 8.7 mg/dL — ABNORMAL LOW (ref 8.9–10.3)
Chloride: 103 mmol/L (ref 98–111)
Creatinine: 1.41 mg/dL — ABNORMAL HIGH (ref 0.44–1.00)
GFR, Est AFR Am: 44 mL/min — ABNORMAL LOW (ref >60)
GFR, Estimated: 38 mL/min — ABNORMAL LOW (ref >60)
Glucose, Bld: 236 mg/dL — ABNORMAL HIGH (ref 70–99)
Potassium: 4.2 mmol/L (ref 3.5–5.1)
Sodium: 139 mmol/L (ref 135–145)
Total Bilirubin: 0.7 mg/dL (ref 0.3–1.2)
Total Protein: 6.2 g/dL — ABNORMAL LOW (ref 6.5–8.1)

## 2018-12-23 ENCOUNTER — Ambulatory Visit
Admission: RE | Admit: 2018-12-23 | Discharge: 2018-12-23 | Disposition: A | Discharge status: Home / Self Care | Payer: Medicare Other | Source: Ambulatory Visit | Attending: Radiation Oncology | Admitting: Radiation Oncology

## 2018-12-23 ENCOUNTER — Ambulatory Visit: Payer: Medicare Other

## 2018-12-23 ENCOUNTER — Telehealth: Payer: Self-pay | Admitting: Hematology

## 2018-12-23 ENCOUNTER — Other Ambulatory Visit: Payer: Self-pay

## 2018-12-23 DIAGNOSIS — D509 Iron deficiency anemia, unspecified: Secondary | ICD-10-CM | POA: Diagnosis not present

## 2018-12-23 DIAGNOSIS — C19 Malignant neoplasm of rectosigmoid junction: Secondary | ICD-10-CM | POA: Diagnosis not present

## 2018-12-23 DIAGNOSIS — C3490 Malignant neoplasm of unspecified part of unspecified bronchus or lung: Secondary | ICD-10-CM | POA: Diagnosis not present

## 2018-12-23 DIAGNOSIS — E119 Type 2 diabetes mellitus without complications: Secondary | ICD-10-CM | POA: Diagnosis not present

## 2018-12-23 DIAGNOSIS — I2510 Atherosclerotic heart disease of native coronary artery without angina pectoris: Secondary | ICD-10-CM | POA: Diagnosis not present

## 2018-12-23 DIAGNOSIS — E039 Hypothyroidism, unspecified: Secondary | ICD-10-CM | POA: Diagnosis not present

## 2018-12-23 DIAGNOSIS — C3412 Malignant neoplasm of upper lobe, left bronchus or lung: Secondary | ICD-10-CM | POA: Diagnosis not present

## 2018-12-23 DIAGNOSIS — I509 Heart failure, unspecified: Secondary | ICD-10-CM | POA: Diagnosis not present

## 2018-12-23 DIAGNOSIS — Z510 Encounter for antineoplastic radiation therapy: Secondary | ICD-10-CM | POA: Diagnosis not present

## 2018-12-23 DIAGNOSIS — I110 Hypertensive heart disease with heart failure: Secondary | ICD-10-CM | POA: Diagnosis not present

## 2018-12-23 NOTE — Progress Notes (Signed)
  Radiation Oncology         [phone] ________________________________  Name: Stephanie Combs MRN: [id number]  Date: 10/07/2018  DOB: 09-29-1950  SIMULATION AND TREATMENT PLANNING NOTE  DIAGNOSIS:     ICD-10-CM   1. Small cell lung cancer, left upper lobe (HCC)  C34.12      Site:  chest  NARRATIVE:  The patient was brought to the [facility].  Identity was confirmed.  All relevant records and images related to the planned course of therapy were reviewed.   Written consent to proceed with treatment was confirmed which was freely given after reviewing the details related to the planned course of therapy had been reviewed with the patient.  Then, the patient was set-up in a stable reproducible  supine position for radiation therapy.  CT images were obtained.  Surface markings were placed.    Medically necessary complex treatment device(s) for immobilization:  Vac-lock bag.   The CT images were loaded into the planning software.  Then the target and avoidance structures were contoured.  Treatment planning then occurred.  The radiation prescription was entered and confirmed.  A total of 4 complex treatment devices were fabricated which relate to the designed radiation treatment fields. Additional reduced fields will be used as necessary to improve the dose homogeneity of the plan. Each of these customized fields/ complex treatment devices will be used on a daily basis during the radiation course. I have requested : 3D Simulation  I have requested a DVH of the following structures: target volume, spinal cord, lungs, heart.   The patient will undergo daily image guidance to ensure accurate localization of the target, and adequate minimize dose to the normal surrounding structures in close proximity to the target.  PLAN:  The patient will receive 60 Gy in 30 fractions initially. The patient will then receive a 6 Gy boost for a final dose of 66 Gy.  Special treatment procedure  The patient will also receive concurrent chemotherapy during the treatment. The patient may therefore experience increased toxicity or side effects and the patient will be monitored for such problems. This may require extra lab work as necessary. This therefore constitutes a special treatment procedure.   ________________________________   Jodelle Gross, MD, PhD

## 2018-12-23 NOTE — Telephone Encounter (Signed)
No los per 8/12.

## 2018-12-24 ENCOUNTER — Other Ambulatory Visit: Payer: Self-pay

## 2018-12-24 ENCOUNTER — Ambulatory Visit
Admission: RE | Admit: 2018-12-24 | Discharge: 2018-12-24 | Disposition: A | Discharge status: Home / Self Care | Payer: Medicare Other | Source: Ambulatory Visit | Attending: Radiation Oncology | Admitting: Radiation Oncology

## 2018-12-24 DIAGNOSIS — E119 Type 2 diabetes mellitus without complications: Secondary | ICD-10-CM | POA: Diagnosis not present

## 2018-12-24 DIAGNOSIS — I2510 Atherosclerotic heart disease of native coronary artery without angina pectoris: Secondary | ICD-10-CM | POA: Diagnosis not present

## 2018-12-24 DIAGNOSIS — E039 Hypothyroidism, unspecified: Secondary | ICD-10-CM | POA: Diagnosis not present

## 2018-12-24 DIAGNOSIS — Z510 Encounter for antineoplastic radiation therapy: Secondary | ICD-10-CM | POA: Diagnosis not present

## 2018-12-24 DIAGNOSIS — I110 Hypertensive heart disease with heart failure: Secondary | ICD-10-CM | POA: Diagnosis not present

## 2018-12-24 DIAGNOSIS — C3412 Malignant neoplasm of upper lobe, left bronchus or lung: Secondary | ICD-10-CM

## 2018-12-24 DIAGNOSIS — I509 Heart failure, unspecified: Secondary | ICD-10-CM | POA: Diagnosis not present

## 2018-12-24 DIAGNOSIS — C19 Malignant neoplasm of rectosigmoid junction: Secondary | ICD-10-CM | POA: Diagnosis not present

## 2018-12-24 DIAGNOSIS — C3490 Malignant neoplasm of unspecified part of unspecified bronchus or lung: Secondary | ICD-10-CM | POA: Diagnosis not present

## 2018-12-24 DIAGNOSIS — D509 Iron deficiency anemia, unspecified: Secondary | ICD-10-CM | POA: Diagnosis not present

## 2018-12-24 MED ORDER — RADIAPLEXRX EX GEL
Freq: Two times a day (BID) | CUTANEOUS | Status: DC
  Administered 2018-12-24: 13:00:00 via TOPICAL

## 2018-12-25 ENCOUNTER — Ambulatory Visit: Payer: Medicare Other

## 2018-12-25 DIAGNOSIS — I5033 Acute on chronic diastolic (congestive) heart failure: Secondary | ICD-10-CM | POA: Diagnosis not present

## 2018-12-25 DIAGNOSIS — I130 Hypertensive heart and chronic kidney disease with heart failure and stage 1 through stage 4 chronic kidney disease, or unspecified chronic kidney disease: Secondary | ICD-10-CM | POA: Diagnosis not present

## 2018-12-25 DIAGNOSIS — E1122 Type 2 diabetes mellitus with diabetic chronic kidney disease: Secondary | ICD-10-CM | POA: Diagnosis not present

## 2018-12-25 DIAGNOSIS — I5022 Chronic systolic (congestive) heart failure: Secondary | ICD-10-CM | POA: Diagnosis not present

## 2018-12-25 DIAGNOSIS — L02211 Cutaneous abscess of abdominal wall: Secondary | ICD-10-CM | POA: Diagnosis not present

## 2018-12-27 ENCOUNTER — Ambulatory Visit
Admission: RE | Admit: 2018-12-27 | Discharge: 2018-12-27 | Disposition: A | Discharge status: Home / Self Care | Payer: Medicare Other | Source: Ambulatory Visit | Attending: Radiation Oncology | Admitting: Radiation Oncology

## 2018-12-27 ENCOUNTER — Other Ambulatory Visit: Payer: Self-pay

## 2018-12-27 DIAGNOSIS — C3412 Malignant neoplasm of upper lobe, left bronchus or lung: Secondary | ICD-10-CM | POA: Diagnosis not present

## 2018-12-27 DIAGNOSIS — I509 Heart failure, unspecified: Secondary | ICD-10-CM | POA: Diagnosis not present

## 2018-12-27 DIAGNOSIS — C3490 Malignant neoplasm of unspecified part of unspecified bronchus or lung: Secondary | ICD-10-CM | POA: Diagnosis not present

## 2018-12-27 DIAGNOSIS — I2510 Atherosclerotic heart disease of native coronary artery without angina pectoris: Secondary | ICD-10-CM | POA: Diagnosis not present

## 2018-12-27 DIAGNOSIS — E039 Hypothyroidism, unspecified: Secondary | ICD-10-CM | POA: Diagnosis not present

## 2018-12-27 DIAGNOSIS — Z510 Encounter for antineoplastic radiation therapy: Secondary | ICD-10-CM | POA: Diagnosis not present

## 2018-12-27 DIAGNOSIS — D509 Iron deficiency anemia, unspecified: Secondary | ICD-10-CM | POA: Diagnosis not present

## 2018-12-27 DIAGNOSIS — E119 Type 2 diabetes mellitus without complications: Secondary | ICD-10-CM | POA: Diagnosis not present

## 2018-12-27 DIAGNOSIS — C19 Malignant neoplasm of rectosigmoid junction: Secondary | ICD-10-CM | POA: Diagnosis not present

## 2018-12-27 DIAGNOSIS — I110 Hypertensive heart disease with heart failure: Secondary | ICD-10-CM | POA: Diagnosis not present

## 2018-12-28 ENCOUNTER — Ambulatory Visit
Admission: RE | Admit: 2018-12-28 | Discharge: 2018-12-28 | Disposition: A | Discharge status: Home / Self Care | Payer: Medicare Other | Source: Ambulatory Visit | Attending: Radiation Oncology | Admitting: Radiation Oncology

## 2018-12-28 ENCOUNTER — Other Ambulatory Visit: Payer: Self-pay

## 2018-12-28 DIAGNOSIS — I110 Hypertensive heart disease with heart failure: Secondary | ICD-10-CM | POA: Diagnosis not present

## 2018-12-28 DIAGNOSIS — C19 Malignant neoplasm of rectosigmoid junction: Secondary | ICD-10-CM | POA: Diagnosis not present

## 2018-12-28 DIAGNOSIS — Z510 Encounter for antineoplastic radiation therapy: Secondary | ICD-10-CM | POA: Diagnosis not present

## 2018-12-28 DIAGNOSIS — E039 Hypothyroidism, unspecified: Secondary | ICD-10-CM | POA: Diagnosis not present

## 2018-12-28 DIAGNOSIS — I509 Heart failure, unspecified: Secondary | ICD-10-CM | POA: Diagnosis not present

## 2018-12-28 DIAGNOSIS — D509 Iron deficiency anemia, unspecified: Secondary | ICD-10-CM | POA: Diagnosis not present

## 2018-12-28 DIAGNOSIS — C3412 Malignant neoplasm of upper lobe, left bronchus or lung: Secondary | ICD-10-CM | POA: Diagnosis not present

## 2018-12-28 DIAGNOSIS — E119 Type 2 diabetes mellitus without complications: Secondary | ICD-10-CM | POA: Diagnosis not present

## 2018-12-28 DIAGNOSIS — C3490 Malignant neoplasm of unspecified part of unspecified bronchus or lung: Secondary | ICD-10-CM | POA: Diagnosis not present

## 2018-12-28 DIAGNOSIS — I2510 Atherosclerotic heart disease of native coronary artery without angina pectoris: Secondary | ICD-10-CM | POA: Diagnosis not present

## 2018-12-29 ENCOUNTER — Ambulatory Visit
Admission: RE | Admit: 2018-12-29 | Discharge: 2018-12-29 | Disposition: A | Discharge status: Home / Self Care | Payer: Medicare Other | Source: Ambulatory Visit | Attending: Radiation Oncology | Admitting: Radiation Oncology

## 2018-12-29 ENCOUNTER — Other Ambulatory Visit: Payer: Self-pay

## 2018-12-29 DIAGNOSIS — M25562 Pain in left knee: Secondary | ICD-10-CM | POA: Diagnosis not present

## 2018-12-29 DIAGNOSIS — C3490 Malignant neoplasm of unspecified part of unspecified bronchus or lung: Secondary | ICD-10-CM | POA: Diagnosis not present

## 2018-12-29 DIAGNOSIS — I2510 Atherosclerotic heart disease of native coronary artery without angina pectoris: Secondary | ICD-10-CM | POA: Diagnosis not present

## 2018-12-29 DIAGNOSIS — E119 Type 2 diabetes mellitus without complications: Secondary | ICD-10-CM | POA: Diagnosis not present

## 2018-12-29 DIAGNOSIS — C19 Malignant neoplasm of rectosigmoid junction: Secondary | ICD-10-CM | POA: Diagnosis not present

## 2018-12-29 DIAGNOSIS — C3412 Malignant neoplasm of upper lobe, left bronchus or lung: Secondary | ICD-10-CM | POA: Diagnosis not present

## 2018-12-29 DIAGNOSIS — I951 Orthostatic hypotension: Secondary | ICD-10-CM | POA: Diagnosis not present

## 2018-12-29 DIAGNOSIS — E039 Hypothyroidism, unspecified: Secondary | ICD-10-CM | POA: Diagnosis not present

## 2018-12-29 DIAGNOSIS — I110 Hypertensive heart disease with heart failure: Secondary | ICD-10-CM | POA: Diagnosis not present

## 2018-12-29 DIAGNOSIS — D509 Iron deficiency anemia, unspecified: Secondary | ICD-10-CM | POA: Diagnosis not present

## 2018-12-29 DIAGNOSIS — I509 Heart failure, unspecified: Secondary | ICD-10-CM | POA: Diagnosis not present

## 2018-12-29 DIAGNOSIS — M25511 Pain in right shoulder: Secondary | ICD-10-CM | POA: Diagnosis not present

## 2018-12-29 DIAGNOSIS — Z510 Encounter for antineoplastic radiation therapy: Secondary | ICD-10-CM | POA: Diagnosis not present

## 2018-12-29 NOTE — Progress Notes (Signed)
[place]   [phone] [phone]   Clinic Follow up Note   Patient Care Team: [name], MD as PCP - General (Internal Medicine) Herminio Commons, MD as PCP - Cardiology (Cardiology) Neldon Labella, RN as [place] Management Truitt Merle, MD as Consulting Physician (Hematology)  Date of Service:  12/30/2018  CHIEF COMPLAINT: F/u Small cell lung cancer, on chemo and RT  SUMMARY OF ONCOLOGIC HISTORY: Oncology History Overview Note  Cancer Staging Primary cancer of hepatic flexure of colon [facility]) Staging form: Colon and Rectum, AJCC 8th Edition - Clinical stage from 09/09/2018: Stage Unknown (cTX, cN0, cM0) - Signed by Truitt Merle, MD on 09/28/2018  Small cell lung cancer, left upper lobe ([place]) Staging form: Lung, AJCC 8th Edition - Clinical stage from 09/14/2018: Stage IIIB (cT3, cN2, cM0) - Signed by Truitt Merle, MD on 09/28/2018     Primary cancer of hepatic flexure of colon ([place]) (Resolved)   Initial Diagnosis   Malignant neoplasm of hepatic flexure ([place])   09/09/2018 Procedure   Coloscopy by Dr Gala Romney 09/09/18  IMPRESSION -Colon mass as described in the vicinity of the hepatic flexure - status post biopsy and inking. Polyps associated with this tumor scattered all the way to the cecum were not removed; multiple splenic flexure and rectal polyps were removed as described above. Left-sided diverticulosis.  Upper endoscopy By Dr Gala Romney 09/09/18  IMPRESSION -Abnormal distal esophagus suspicious for Barrett's esophagus?biopsied. - Small hiatal hernia. - Erosive gastropathy. Biopsied. -Duodenal bulbar erosions   09/09/2018 Initial Biopsy   Diagnosis 09/09/18 1. Stomach, biopsy - GASTRIC ANTRAL MUCOSA WITH MILD NONSPECIFIC REACTIVE GASTROPATHY - GASTRIC OXYNTIC MUCOSA WITH PARIETAL CELL HYPERPLASIA AS CAN BE SEEN IN HYPERGASTRINEMIC STATES SUCH AS PROPER PATIENT IDENTIFICATION THERAPY. - WARTHIN STARRY STAIN IS NEGATIVE FOR  HELICOBACTER PYLORI 2. Esophagus, biopsy - BARRETT'S ESOPHAGUS, NEGATIVE FOR DYSPLASIA 3. Colon, polyp(s), descending - TUBULAR ADENOMA - NEGATIVE FOR HIGH-GRADE DYSPLASIA OR MALIGNANCY 4. Colon, biopsy, hepatic flexure - ADENOCARCINOMA. SEE NOTE 5. Colon, polyp(s), splenic flexure - ADENOCARCINOMA. SEE NOTE - OTHER FRAGMENTS OF TUBULAR ADENOMA(S) WITH ONE SHOWING HIGH-GRADE DYSPLASIA 6. Rectum, polyp(s) - TUBULAR ADENOMA(S) - NEGATIVE FOR HIGH-GRADE DYSPLASIA OR MALIGNANCY   09/09/2018 Cancer Staging   Staging form: Colon and Rectum, AJCC 8th Edition - Clinical stage from 09/09/2018: Stage Unknown (cTX, cN0, cM0) - Signed by Truitt Merle, MD on 09/28/2018   09/11/2018 Imaging   CT CAP 09/11/18  IMPRESSION: 1. Central perihilar left upper lobe 5.4 cm lung mass, new since 01/27/2017 chest CT, compatible with malignancy, favor primary bronchogenic carcinoma. Postobstructive pneumonia/atelectasis in the lingula. 2. Mediastinal adenopathy in the left prevascular/AP window and subcarinal stations, compatible with metastatic nodes (more likely originating from the left upper lobe lung mass). 3. Trace dependent left pleural effusion. 4. Apple-core lesion in the ascending colon, compatible with known primary colonic neoplasm. Adjacent top-normal size right mesenteric lymph node is equivocal for locoregional nodal metastasis. No additional potential findings of metastatic disease in the abdomen or pelvis. 5. Chronic findings include: Aortic Atherosclerosis (ICD10-I70.0). Borderline mild cardiomegaly. One vessel coronary atherosclerosis. Dilated main pulmonary artery, suggesting pulmonary arterial hypertension. Left adrenal adenoma. Punctate bilateral nephrolithiasis. Small hiatal hernia.   09/20/2018 Imaging   Brain MRI 09/20/18  IMPRESSION: 1. Motion degraded examination without brain metastases identified. 2. Small skull metastases not excluded. 3. Moderate to severe chronic small vessel  ischemic disease.   Small cell lung cancer, left upper lobe ([place])  09/11/2018 Imaging  CT CAP 09/11/18  IMPRESSION: 1. Central perihilar left upper lobe 5.4 cm lung mass, new since 01/27/2017 chest CT, compatible with malignancy, favor primary bronchogenic carcinoma. Postobstructive pneumonia/atelectasis in the lingula. 2. Mediastinal adenopathy in the left prevascular/AP window and subcarinal stations, compatible with metastatic nodes (more likely originating from the left upper lobe lung mass). 3. Trace dependent left pleural effusion. 4. Apple-core lesion in the ascending colon, compatible with known primary colonic neoplasm. Adjacent top-normal size right mesenteric lymph node is equivocal for locoregional nodal metastasis. No additional potential findings of metastatic disease in the abdomen or pelvis. 5. Chronic findings include: Aortic Atherosclerosis (ICD10-I70.0). Borderline mild cardiomegaly. One vessel coronary atherosclerosis. Dilated main pulmonary artery, suggesting pulmonary arterial hypertension. Left adrenal adenoma. Punctate bilateral nephrolithiasis. Small hiatal hernia.    09/14/2018 Initial Biopsy   Diagnosis 5/5.20 Lung, biopsy, LUL - SMALL CELL CARCINOMA.   09/14/2018 Cancer Staging   Staging form: Lung, AJCC 8th Edition - Clinical stage from 09/14/2018: Stage IIIB (cT3, cN2, cM0) - Signed by Truitt Merle, MD on 09/28/2018   09/15/2018 Initial Diagnosis   Small cell lung cancer, left upper lobe ([place])   09/18/2018 - 12/17/2018 Chemotherapy   Carboplatin and etoposide every 3 weeks for 4 cycles starting 09/18/18. Held after C2D1 due to COVID-19(+). She restarted with cycle 3 on 11/03/18. Completed 5 cycles on 12/17/18.  RT was concurrnet but started on 11/24/18.   09/20/2018 Imaging   Brain MRI 09/20/18  IMPRESSION: 1. Motion degraded examination without brain metastases identified. 2. Small skull metastases not excluded. 3. Moderate to severe chronic small vessel ischemic  disease.    11/24/2018 -  Radiation Therapy    RT was postponed to 11/24/18. Only concurrent with chemo for 2 cycles. Plan to complete on 01/06/19      CURRENT THERAPY:  -Carboplatin and etoposideevery3weeks for 4 cycles starting 09/18/18. Held afterC2D1due to COVID-19(+). She restarted with cycle3on 11/03/18.Completed 5 cycles on 12/17/18. -RTstarted7/15/20, plan to complete on 01/06/19  INTERVAL HISTORY:  Stephanie Combs is here for a follow up. She presents to the clinic alone in a wheelchair.  She is tolerating radiation moderate well, more fatigued this week.  Her left breast ulcer got worse last week, with some bleeding. improved in the past few days.  Mild dysphagia is stable.  Her appetite is low, but still able to eat decent amount.  No other new complaints.    REVIEW OF SYSTEMS:   Constitutional: Denies fevers, chills or abnormal weight loss, (+) fatigue  Ears, nose, mouth, throat, and face: Denies mucositis or sore throat Respiratory: stable dry cough  Cardiovascular: Denies palpitation, chest discomfort or lower extremity swelling Gastrointestinal:  Denies nausea, heartburn or change in bowel habits Skin: (+) Skin blister in the inner upper left breast Lymphatics: Denies new lymphadenopathy or easy bruising Neurological:Denies numbness, tingling or new weaknesses Behavioral/Psych: Mood is stable, no new changes  All other systems were reviewed with the patient and are negative.  MEDICAL HISTORY:  Past Medical History:  Diagnosis Date  . Anxiety   . CAD (coronary artery disease)    a. s/p DES to LAD and angioplasty to D1 in 05/2017  . CHF (congestive heart failure) (H[facility])    Diastolic  . Chronic back pain   . Chronic pain   . COPD (chronic obstructive pulmonary disease) ([place])   . Degenerative disk disease   . Diabetes mellitus without complication ([place])   . History of medication noncompliance 11/2017   "the patient frequently self-adjusts medications  without  notifying her physicians"  . Hypertension   . Hypothyroidism   . On home O2    2.5 L N/C prn  . Pedal edema     SURGICAL HISTORY: Past Surgical History:  Procedure Laterality Date  . BIOPSY  09/09/2018   Procedure: BIOPSY;  Surgeon: Daneil Dolin, MD;  [facility];  Service: Endoscopy;;  gastric esophageal hepatic flexure colon  . COLONOSCOPY WITH PROPOFOL N/A 09/09/2018   Procedure: COLONOSCOPY WITH PROPOFOL;  Surgeon: Daneil Dolin, MD;  [facility];  Service: Endoscopy;  Laterality: N/A;  . CORONARY STENT INTERVENTION N/A 05/22/2017   Procedure: CORONARY STENT INTERVENTION;  Surgeon: Martinique, Peter M, MD;  Location: [place] [facility];  Service: Cardiovascular;  Laterality: N/A;  . ESOPHAGOGASTRODUODENOSCOPY (EGD) WITH PROPOFOL N/A 09/09/2018   Procedure: ESOPHAGOGASTRODUODENOSCOPY (EGD) WITH PROPOFOL;  Surgeon: Daneil Dolin, MD;  [facility];  Service: Endoscopy;  Laterality: N/A;  . HERNIA REPAIR    . POLYPECTOMY  09/09/2018   Procedure: POLYPECTOMY;  Surgeon: Daneil Dolin, MD;  [facility];  Service: Endoscopy;;  colon  . RIGHT/LEFT HEART CATH AND CORONARY ANGIOGRAPHY N/A 05/19/2017   Procedure: RIGHT/LEFT HEART CATH AND CORONARY ANGIOGRAPHY;  Surgeon: Leonie Man, MD;  Location: [place] [facility];  Service: Cardiovascular;  Laterality: N/A;  . TUBAL LIGATION    . VIDEO BRONCHOSCOPY WITH ENDOBRONCHIAL ULTRASOUND Left 09/14/2018   Procedure: VIDEO BRONCHOSCOPY WITH ENDOBRONCHIAL ULTRASOUND with biopsies;  Surgeon: Margaretha Seeds, MD;  Location: [place];  Service: Thoracic;  Laterality: Left;    I have reviewed the social history and family history with the patient and they are unchanged from previous note.  ALLERGIES:  is allergic to dilaudid [hydromorphone hcl]; midodrine hcl; actifed cold-allergy [chlorpheniramine-phenylephrine]; doxycycline; other; penicillins; reglan [metoclopramide]; valium [diazepam]; and vistaril  [hydroxyzine hcl].  MEDICATIONS:  Current Outpatient Medications  Medication Sig Dispense Refill  . albuterol (PROAIR HFA) 108 (90 Base) MCG/ACT inhaler Inhale 2 puffs into the lungs every 4 (four) hours as needed for wheezing or shortness of breath. For shortness of breath 18 g 0  . albuterol (PROVENTIL) (2.5 MG/3ML) 0.083% nebulizer solution Take 2.5 mg by nebulization 2 (two) times daily as needed for wheezing or shortness of breath.     . allopurinol (ZYLOPRIM) 300 MG tablet Take 1 tablet (300 mg total) by mouth daily for 15 days. 30 tablet 3  . ALPRAZolam (XANAX) 1 MG tablet Take 1 mg by mouth 3 (three) times daily as needed for anxiety or sleep.     Marland Kitchen aspirin EC 81 MG tablet Take 81 mg by mouth daily.    Marland Kitchen atorvastatin (LIPITOR) 80 MG tablet Take 1 tablet (80 mg total) by mouth daily at 6 PM. 30 tablet 0  . cholecalciferol (VITAMIN D3) 25 MCG (1000 UT) tablet Take 5,000 Units by mouth daily.    . furosemide (LASIX) 40 MG tablet Take 60 mg (1 1/2 tabs) in the morning and 40 mg (1 tab) in the afternoon 75 tablet 6  . gabapentin (NEURONTIN) 600 MG tablet Take 600 mg by mouth 3 (three) times daily.     . Hydrocortisone (GERHARDT'S BUTT CREAM) CREA Apply 1 application topically 4 (four) times daily as needed for irritation. 1 each 3  . insulin aspart (NOVOLOG) 100 UNIT/ML injection Inject 1-18 Units into the skin 3 (three) times daily with meals. (Patient taking differently: Inject 0-20 Units into the skin 4 (four) times daily -  before meals and at bedtime. )    . insulin detemir (LEVEMIR) 100 UNIT/ML injection Inject 0.4 mLs (40 Units total) into the skin at bedtime. 10 mL 0  . levothyroxine (SYNTHROID, LEVOTHROID) 200 MCG tablet Take 1 tablet by mouth daily before breakfast.     . nitroGLYCERIN (NITROSTAT) 0.4 MG SL tablet Place 0.4 mg under the tongue every 5 (five) minutes as needed for chest pain.     Marland Kitchen nystatin (MYCOSTATIN/NYSTOP) powder Apply topically 2 (two) times daily. 15 g 0  .  Omega-3 Fatty Acids (FISH OIL PO) Take 1 capsule by mouth daily.     . ondansetron (ZOFRAN) 8 MG tablet Take 1 tablet (8 mg total) by mouth every 8 (eight) hours as needed for nausea or vomiting. 120 tablet 2  . oxyCODONE-acetaminophen (PERCOCET) 10-325 MG tablet Take 1 tablet by mouth every 6 (six) hours as needed for pain.    . OXYGEN Inhale 2 L into the lungs continuous.    . pantoprazole (PROTONIX) 40 MG tablet Take 1 tablet (40 mg total) by mouth 2 (two) times daily. 180 tablet 3  . polyethylene glycol (MIRALAX / GLYCOLAX) 17 g packet Take 17 g by mouth 2 (two) times daily. 14 each 0  . potassium chloride (MICRO-K) 10 MEQ CR capsule Take 2 capsules (20 mEq total) by mouth 2 (two) times daily. *May take one additional tablet as needed for cramping 120 capsule 3  . prochlorperazine (COMPAZINE) 10 MG tablet Take 1 tablet (10 mg total) by mouth every 6 (six) hours as needed for nausea or vomiting. 30 tablet 0  . promethazine (PHENERGAN) 12.5 MG tablet Take 1 tablet (12.5 mg total) by mouth every 6 (six) hours as needed for nausea or vomiting. 45 tablet 1  . senna (SENOKOT) 8.6 MG TABS tablet Take 2 tablets (17.2 mg total) by mouth 2 (two) times daily with a meal. 120 each 0  . sucralfate (CARAFATE) 1 g tablet Take 1 tablet (1 g total) by mouth 4 (four) times daily -  with meals and at bedtime. 5 min before meals for radiation induced esophagitis 120 tablet 2  . SURE COMFORT INS SYR 1CC/28G 28G X 1/2" 1 ML MISC USE TO INJECT INSULIN UP TO 4 TIMES DAILY. 150 each 5  . tiZANidine (ZANAFLEX) 4 MG tablet Take 1 tablet (4 mg total) by mouth every 8 (eight) hours as needed for muscle spasms. 30 tablet 0  . topiramate (TOPAMAX) 25 MG tablet Take 75 mg by mouth at bedtime.     Marland Kitchen umeclidinium-vilanterol (ANORO ELLIPTA) 62.5-25 MCG/INH AEPB Inhale 1 puff into the lungs daily. 30 each 0   No current facility-administered medications for this visit.     PHYSICAL EXAMINATION: ECOG PERFORMANCE STATUS: 2 -  Symptomatic, <50% confined to bed  Vitals:   12/30/18 1203  BP: 130/81  Pulse: 89  Resp: 18  Temp: 97.8 F (36.6 C)  SpO2: 100%   Filed Weights   12/30/18 1203  Weight: 280 lb 8 oz (127.2 kg)   GENERAL:alert, no distress and comfortable SKIN: skin color, texture, turgor are normal, no rashes or significant lesions except a shallow ulcer at left inner breast, slightly bigger than last week, no bleeding or discharge  EYES: normal, Conjunctiva are pink and non-injected, sclera clear NECK: supple, thyroid normal size, non-tender, without nodularity LYMPH:  no palpable lymphadenopathy in the cervical, axillary  LUNGS: clear to auscultation and percussion with normal breathing effort HEART: regular rate & rhythm and no murmurs  and no lower extremity edema ABDOMEN:abdomen soft, non-tender and normal bowel sounds Musculoskeletal:no cyanosis of digits and no clubbing, (+) pitting edema and skin erythema/hyperpigmentation in bilateral lower extremity NEURO: alert & oriented x 3 with fluent speech, no focal motor/sensory deficits  LABORATORY DATA:  I have reviewed the data as listed CBC Latest Ref Rng & Units 12/30/2018 12/22/2018 12/15/2018  WBC 4.0 - 10.5 K/uL 1.4(L) 4.1 4.0  Hemoglobin 12.0 - 15.0 g/dL 7.2(L) 8.7(L) 8.8(L)  Hematocrit 36.0 - 46.0 % 22.7(L) 26.9(L) 28.9(L)  Platelets 150 - 400 K/uL 111(L) 203 273     CMP Latest Ref Rng & Units 12/30/2018 12/22/2018 12/15/2018  Glucose 70 - 99 mg/dL 163(H) 236(H) 190(H)  BUN 8 - 23 mg/dL 28(H) 25(H) 25(H)  Creatinine 0.44 - 1.00 mg/dL 2.34(H) 1.41(H) 1.85(H)  Sodium 135 - 145 mmol/L 136 139 142  Potassium 3.5 - 5.1 mmol/L 4.2 4.2 3.8  Chloride 98 - 111 mmol/L 103 103 104  CO2 22 - 32 mmol/L '22 26 28  ' Calcium 8.9 - 10.3 mg/dL 9.1 8.7(L) 8.5(L)  Total Protein 6.5 - 8.1 g/dL 6.5 6.2(L) 6.1(L)  Total Bilirubin 0.3 - 1.2 mg/dL 0.6 0.7 0.3  Alkaline Phos 38 - 126 U/L 107 98 105  AST 15 - 41 U/L 11(L) 12(L) 11(L)  ALT 0 - 44 U/L '8 9 8   ' ANC  0.7K today    RADIOGRAPHIC STUDIES: I have personally reviewed the radiological images as listed and agreed with the findings in the report. No results found.   ASSESSMENT & PLAN:  SHEERA ILLINGWORTH is a 68 y.o. female with   1. Small Cell Lung cancer, LUL, cT3N2M0, stage III, limited stage  -Newly diagnosedin 5/2020during her work up for colon cancer -Ipreviouslydiscussed her lung cancer would take priority in terms of treatment given theaggressivenessof small cell lung cancer, and very high risk of recurrence evenwith curative intentconcurrentchemoradiation.  -She completed last cycle chemo on 8/7, with significant dose reduction. -She is tolerating radiation moderate well, with more fatigue, mild esophagitis and skin toxicity -Lab reviewed, she has developed significant pancytopenia, will give one unit blood tomorrow  -repeat CBC next Monday   2. 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  -Unfortunately she is not a candidate for colon cancer surgery due to very high risk ofsurgical mortality.She was seen by surgery during her recent hospitalization.  -I discussed the incurablenature of her colon cancer without surgery. -Willcontinueto monitor on current treatment for her SCLC. -if she recovers from chemoRT for [place], will consider low intensity chemo such as Xeloda or Irinotecan (which works for [employer] also). Given normal MMR, she is not a candidate for immunotherapy. Given primary right side colon cancer, not much benefit from EGFR inhibitors even if KRAS/NRAS/BRAF wild type.  3. 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 -anemia slightly worse due to chemo -Continue monitoring -Last blood trasnfusion on 12/10/18.  -hg7.2 today, will  give one unit blood with Iv lasix tomorrow   4. CAD, CHF, HTN, significant LE edema, CKD -OnLipitor, lasix, nitro -Continue to f/u with cardiologist. -Her CHFand fluid overload has muchimproved during her recent hospitalization. -She has been on 8m Lasix in the morning.She responded well to iv lasixwhich I give during his chemo -I held her oral lasix and gave iv lasix 476mailyduring chemowhich she responded very well  5.DM,hypothyroidism, morbid obesity -On  insulin novolog and long acting levemir. OnLevothyroxine. -Willmonitor glucose closely during her chemo treatment -stable  6.Rheumatoidarthritis, chronic back pain  -On Percocet which she takes 4 times a day.  -She is also hasGabapentin.  -She use to receiveinjectionfor her back -Pain chronic, stable. She will continue to see her RA and pain specialist.   7. Depression and anxiety -She has been more depressed with recent diagnosis of 2 cancers -She is currently on Celebrex, will continue. -stable   8.Goal of care discussion, DNR/DNI -We previously discussed the incurable nature of her cancer, and the overall poor prognosis, especially ifshe does not have good response to chemotherapy or progress on chemo -The patient understands the goal of care is palliative. -I recommend DNR/DNI,she has agreed to DRI/DNR.She has written will and has established her daughter as her POA.  9.History ofCOVID-19 -She was incidentally found to be positive for COVID-19on 10/11/2018, no respiratory symptoms or evidence of pneumonia,she hasGIsymptoms which could be relate -Her testing on 11/04/18 was positive again. -no COVID symptoms  10. Neutropenia and thrombocytopenia, secondary to chemotherapy -We reviewed neutropenia precaution, and risk of bleeding -Monitor CBC closely  Plan: -lab reviewed, she has developed severe pancytopenia, will give 1 unit of blood tomorrow over 3 hours with IV Lasix 20  mg -Repeat CBC on Monday, August 21 -Continue RT -Lab and follow-up in one week with last dose radiation    No problem-specific Assessment & Plan notes found for this encounter.   Orders Placed This Encounter  Procedures  . Type and screen    Standing Status:   Standing    Number of Occurrences:   1   All questions were answered. The patient knows to call the clinic with any problems, questions or concerns. No barriers to learning was detected. I spent 15 minutes counseling the patient face to face. The total time spent in the appointment was 20 minutes and more than 50% was on counseling and review of test results     Truitt Merle, MD 12/30/2018   I, Joslyn Devon, am acting as scribe for Truitt Merle, MD.   I have reviewed the above documentation for accuracy and completeness, and I agree with the above.

## 2018-12-30 ENCOUNTER — Telehealth: Payer: Self-pay | Admitting: Hematology

## 2018-12-30 ENCOUNTER — Inpatient Hospital Stay (HOSPITAL_BASED_OUTPATIENT_CLINIC_OR_DEPARTMENT_OTHER): Payer: Medicare Other | Admitting: Hematology

## 2018-12-30 ENCOUNTER — Other Ambulatory Visit: Payer: Self-pay | Admitting: Emergency Medicine

## 2018-12-30 ENCOUNTER — Ambulatory Visit
Admission: RE | Admit: 2018-12-30 | Discharge: 2018-12-30 | Disposition: A | Discharge status: Home / Self Care | Payer: Medicare Other | Source: Ambulatory Visit | Attending: Radiation Oncology | Admitting: Radiation Oncology

## 2018-12-30 ENCOUNTER — Encounter: Payer: Self-pay | Admitting: Hematology

## 2018-12-30 ENCOUNTER — Inpatient Hospital Stay: Payer: Medicare Other

## 2018-12-30 ENCOUNTER — Other Ambulatory Visit: Payer: Self-pay

## 2018-12-30 ENCOUNTER — Inpatient Hospital Stay: Payer: Medicare Other | Attending: Hematology

## 2018-12-30 VITALS — BP 130/81 | HR 89 | Temp 97.8°F | Resp 18 | Ht 61.5 in | Wt 280.5 lb

## 2018-12-30 DIAGNOSIS — Z7982 Long term (current) use of aspirin: Secondary | ICD-10-CM | POA: Diagnosis not present

## 2018-12-30 DIAGNOSIS — C3490 Malignant neoplasm of unspecified part of unspecified bronchus or lung: Secondary | ICD-10-CM | POA: Diagnosis not present

## 2018-12-30 DIAGNOSIS — I10 Essential (primary) hypertension: Secondary | ICD-10-CM

## 2018-12-30 DIAGNOSIS — Z794 Long term (current) use of insulin: Secondary | ICD-10-CM

## 2018-12-30 DIAGNOSIS — T451X5A Adverse effect of antineoplastic and immunosuppressive drugs, initial encounter: Secondary | ICD-10-CM | POA: Diagnosis not present

## 2018-12-30 DIAGNOSIS — C3412 Malignant neoplasm of upper lobe, left bronchus or lung: Secondary | ICD-10-CM

## 2018-12-30 DIAGNOSIS — Z510 Encounter for antineoplastic radiation therapy: Secondary | ICD-10-CM | POA: Diagnosis not present

## 2018-12-30 DIAGNOSIS — N183 Chronic kidney disease, stage 3 unspecified: Secondary | ICD-10-CM

## 2018-12-30 DIAGNOSIS — Z79899 Other long term (current) drug therapy: Secondary | ICD-10-CM | POA: Diagnosis not present

## 2018-12-30 DIAGNOSIS — I110 Hypertensive heart disease with heart failure: Secondary | ICD-10-CM | POA: Diagnosis not present

## 2018-12-30 DIAGNOSIS — E119 Type 2 diabetes mellitus without complications: Secondary | ICD-10-CM | POA: Diagnosis not present

## 2018-12-30 DIAGNOSIS — C183 Malignant neoplasm of hepatic flexure: Secondary | ICD-10-CM | POA: Diagnosis not present

## 2018-12-30 DIAGNOSIS — D61810 Antineoplastic chemotherapy induced pancytopenia: Secondary | ICD-10-CM | POA: Diagnosis not present

## 2018-12-30 DIAGNOSIS — I2510 Atherosclerotic heart disease of native coronary artery without angina pectoris: Secondary | ICD-10-CM | POA: Diagnosis not present

## 2018-12-30 DIAGNOSIS — M069 Rheumatoid arthritis, unspecified: Secondary | ICD-10-CM | POA: Diagnosis not present

## 2018-12-30 DIAGNOSIS — D649 Anemia, unspecified: Secondary | ICD-10-CM

## 2018-12-30 DIAGNOSIS — I5032 Chronic diastolic (congestive) heart failure: Secondary | ICD-10-CM | POA: Diagnosis not present

## 2018-12-30 DIAGNOSIS — E1122 Type 2 diabetes mellitus with diabetic chronic kidney disease: Secondary | ICD-10-CM | POA: Diagnosis not present

## 2018-12-30 DIAGNOSIS — Z8619 Personal history of other infectious and parasitic diseases: Secondary | ICD-10-CM | POA: Diagnosis not present

## 2018-12-30 DIAGNOSIS — I509 Heart failure, unspecified: Secondary | ICD-10-CM | POA: Diagnosis not present

## 2018-12-30 DIAGNOSIS — N182 Chronic kidney disease, stage 2 (mild): Secondary | ICD-10-CM

## 2018-12-30 DIAGNOSIS — C19 Malignant neoplasm of rectosigmoid junction: Secondary | ICD-10-CM | POA: Diagnosis not present

## 2018-12-30 DIAGNOSIS — E039 Hypothyroidism, unspecified: Secondary | ICD-10-CM | POA: Diagnosis not present

## 2018-12-30 DIAGNOSIS — Z5111 Encounter for antineoplastic chemotherapy: Secondary | ICD-10-CM | POA: Diagnosis not present

## 2018-12-30 DIAGNOSIS — D509 Iron deficiency anemia, unspecified: Secondary | ICD-10-CM | POA: Diagnosis not present

## 2018-12-30 LAB — CMP (CANCER CENTER ONLY)
ALT: 8 U/L (ref 0–44)
AST: 11 U/L — ABNORMAL LOW (ref 15–41)
Albumin: 3.3 g/dL — ABNORMAL LOW (ref 3.5–5.0)
Alkaline Phosphatase: 107 U/L (ref 38–126)
Anion gap: 11 (ref 5–15)
BUN: 28 mg/dL — ABNORMAL HIGH (ref 8–23)
CO2: 22 mmol/L (ref 22–32)
Calcium: 9.1 mg/dL (ref 8.9–10.3)
Chloride: 103 mmol/L (ref 98–111)
Creatinine: 2.34 mg/dL — ABNORMAL HIGH (ref 0.44–1.00)
GFR, Est AFR Am: 24 mL/min — ABNORMAL LOW (ref >60)
GFR, Estimated: 21 mL/min — ABNORMAL LOW (ref >60)
Glucose, Bld: 163 mg/dL — ABNORMAL HIGH (ref 70–99)
Potassium: 4.2 mmol/L (ref 3.5–5.1)
Sodium: 136 mmol/L (ref 135–145)
Total Bilirubin: 0.6 mg/dL (ref 0.3–1.2)
Total Protein: 6.5 g/dL (ref 6.5–8.1)

## 2018-12-30 LAB — CBC WITH DIFFERENTIAL (CANCER CENTER ONLY)
Abs Immature Granulocytes: 0 10*3/uL (ref 0.00–0.07)
Basophils Absolute: 0 10*3/uL (ref 0.0–0.1)
Basophils Relative: 2 %
Eosinophils Absolute: 0 10*3/uL (ref 0.0–0.5)
Eosinophils Relative: 3 %
HCT: 22.7 % — ABNORMAL LOW (ref 36.0–46.0)
Hemoglobin: 7.2 g/dL — ABNORMAL LOW (ref 12.0–15.0)
Immature Granulocytes: 0 %
Lymphocytes Relative: 29 %
Lymphs Abs: 0.4 10*3/uL — ABNORMAL LOW (ref 0.7–4.0)
MCH: 32.3 pg (ref 26.0–34.0)
MCHC: 31.7 g/dL (ref 30.0–36.0)
MCV: 101.8 fL — ABNORMAL HIGH (ref 80.0–100.0)
Monocytes Absolute: 0.2 10*3/uL (ref 0.1–1.0)
Monocytes Relative: 15 %
Neutro Abs: 0.7 10*3/uL — ABNORMAL LOW (ref 1.7–7.7)
Neutrophils Relative %: 51 %
Platelet Count: 111 10*3/uL — ABNORMAL LOW (ref 150–400)
RBC: 2.23 MIL/uL — ABNORMAL LOW (ref 3.87–5.11)
RDW: 18.5 % — ABNORMAL HIGH (ref 11.5–15.5)
WBC Count: 1.4 10*3/uL — ABNORMAL LOW (ref 4.0–10.5)
nRBC: 0 % (ref 0.0–0.2)

## 2018-12-30 LAB — PREPARE RBC (CROSSMATCH)

## 2018-12-30 NOTE — Telephone Encounter (Signed)
Per 8/20 los appts already scheduled.

## 2018-12-31 ENCOUNTER — Ambulatory Visit: Payer: Medicare Other

## 2018-12-31 ENCOUNTER — Inpatient Hospital Stay: Payer: Medicare Other

## 2018-12-31 ENCOUNTER — Other Ambulatory Visit: Payer: Self-pay

## 2018-12-31 ENCOUNTER — Ambulatory Visit
Admission: RE | Admit: 2018-12-31 | Discharge: 2018-12-31 | Disposition: A | Discharge status: Home / Self Care | Payer: Medicare Other | Source: Ambulatory Visit | Attending: Radiation Oncology | Admitting: Radiation Oncology

## 2018-12-31 DIAGNOSIS — Z5111 Encounter for antineoplastic chemotherapy: Secondary | ICD-10-CM | POA: Diagnosis not present

## 2018-12-31 DIAGNOSIS — Z8619 Personal history of other infectious and parasitic diseases: Secondary | ICD-10-CM | POA: Diagnosis not present

## 2018-12-31 DIAGNOSIS — D509 Iron deficiency anemia, unspecified: Secondary | ICD-10-CM | POA: Diagnosis not present

## 2018-12-31 DIAGNOSIS — Z7982 Long term (current) use of aspirin: Secondary | ICD-10-CM | POA: Diagnosis not present

## 2018-12-31 DIAGNOSIS — E039 Hypothyroidism, unspecified: Secondary | ICD-10-CM | POA: Diagnosis not present

## 2018-12-31 DIAGNOSIS — I2510 Atherosclerotic heart disease of native coronary artery without angina pectoris: Secondary | ICD-10-CM | POA: Diagnosis not present

## 2018-12-31 DIAGNOSIS — T451X5A Adverse effect of antineoplastic and immunosuppressive drugs, initial encounter: Secondary | ICD-10-CM | POA: Diagnosis not present

## 2018-12-31 DIAGNOSIS — C19 Malignant neoplasm of rectosigmoid junction: Secondary | ICD-10-CM | POA: Diagnosis not present

## 2018-12-31 DIAGNOSIS — I509 Heart failure, unspecified: Secondary | ICD-10-CM | POA: Diagnosis not present

## 2018-12-31 DIAGNOSIS — C183 Malignant neoplasm of hepatic flexure: Secondary | ICD-10-CM | POA: Diagnosis not present

## 2018-12-31 DIAGNOSIS — I110 Hypertensive heart disease with heart failure: Secondary | ICD-10-CM | POA: Diagnosis not present

## 2018-12-31 DIAGNOSIS — M069 Rheumatoid arthritis, unspecified: Secondary | ICD-10-CM | POA: Diagnosis not present

## 2018-12-31 DIAGNOSIS — D61810 Antineoplastic chemotherapy induced pancytopenia: Secondary | ICD-10-CM | POA: Diagnosis not present

## 2018-12-31 DIAGNOSIS — Z794 Long term (current) use of insulin: Secondary | ICD-10-CM | POA: Diagnosis not present

## 2018-12-31 DIAGNOSIS — C3490 Malignant neoplasm of unspecified part of unspecified bronchus or lung: Secondary | ICD-10-CM | POA: Diagnosis not present

## 2018-12-31 DIAGNOSIS — C3412 Malignant neoplasm of upper lobe, left bronchus or lung: Secondary | ICD-10-CM | POA: Diagnosis not present

## 2018-12-31 DIAGNOSIS — D649 Anemia, unspecified: Secondary | ICD-10-CM

## 2018-12-31 DIAGNOSIS — Z79899 Other long term (current) drug therapy: Secondary | ICD-10-CM | POA: Diagnosis not present

## 2018-12-31 DIAGNOSIS — E119 Type 2 diabetes mellitus without complications: Secondary | ICD-10-CM | POA: Diagnosis not present

## 2018-12-31 DIAGNOSIS — Z510 Encounter for antineoplastic radiation therapy: Secondary | ICD-10-CM | POA: Diagnosis not present

## 2018-12-31 DIAGNOSIS — I10 Essential (primary) hypertension: Secondary | ICD-10-CM | POA: Diagnosis not present

## 2018-12-31 MED ORDER — SODIUM CHLORIDE 0.9% FLUSH
10.0000 mL | INTRAVENOUS | Status: DC | PRN
  Filled 2018-12-31: qty 10

## 2018-12-31 MED ORDER — SODIUM CHLORIDE 0.9% IV SOLUTION
250.0000 mL | Freq: Once | INTRAVENOUS | Status: AC
  Administered 2018-12-31: 250 mL via INTRAVENOUS
  Filled 2018-12-31: qty 250

## 2018-12-31 MED ORDER — ACETAMINOPHEN 325 MG PO TABS
650.0000 mg | ORAL_TABLET | Freq: Once | ORAL | Status: AC
  Administered 2018-12-31: 650 mg via ORAL

## 2018-12-31 MED ORDER — DIPHENHYDRAMINE HCL 25 MG PO CAPS
ORAL_CAPSULE | ORAL | Status: AC
  Filled 2018-12-31: qty 1

## 2018-12-31 MED ORDER — DIPHENHYDRAMINE HCL 25 MG PO CAPS
25.0000 mg | ORAL_CAPSULE | Freq: Once | ORAL | Status: AC
  Administered 2018-12-31: 10:00:00 25 mg via ORAL

## 2018-12-31 MED ORDER — HEPARIN SOD (PORK) LOCK FLUSH 100 UNIT/ML IV SOLN
500.0000 [IU] | Freq: Every day | INTRAVENOUS | Status: DC | PRN
  Filled 2018-12-31: qty 5

## 2018-12-31 MED ORDER — ACETAMINOPHEN 325 MG PO TABS
ORAL_TABLET | ORAL | Status: AC
  Filled 2018-12-31: qty 2

## 2018-12-31 MED ORDER — FUROSEMIDE 10 MG/ML IJ SOLN
20.0000 mg | Freq: Once | INTRAMUSCULAR | Status: AC
  Administered 2018-12-31: 20 mg via INTRAVENOUS

## 2018-12-31 MED ORDER — FUROSEMIDE 10 MG/ML IJ SOLN
INTRAMUSCULAR | Status: AC
  Filled 2018-12-31: qty 2

## 2018-12-31 NOTE — Patient Instructions (Signed)
Blood Transfusion, Adult, Care After This sheet gives you information about how to care for yourself after your procedure. Your doctor may also give you more specific instructions. If you have problems or questions, contact your doctor. Follow these instructions at home:   Take over-the-counter and prescription medicines only as told by your doctor.  Go back to your normal activities as told by your doctor.  Follow instructions from your doctor about how to take care of the area where an IV tube was put into your vein (insertion site). Make sure you: ? Wash your hands with soap and water before you change your bandage (dressing). If there is no soap and water, use hand sanitizer. ? Change your bandage as told by your doctor.  Check your IV insertion site every day for signs of infection. Check for: ? More redness, swelling, or pain. ? More fluid or blood. ? Warmth. ? Pus or a bad smell. Contact a doctor if:  You have more redness, swelling, or pain around the IV insertion site.  You have more fluid or blood coming from the IV insertion site.  Your IV insertion site feels warm to the touch.  You have pus or a bad smell coming from the IV insertion site.  Your pee (urine) turns pink, red, or brown.  You feel weak after doing your normal activities. Get help right away if:  You have signs of a serious allergic or body defense (immune) system reaction, including: ? Itchiness. ? Hives. ? Trouble breathing. ? Anxiety. ? Pain in your chest or lower back. ? Fever, flushing, and chills. ? Fast pulse. ? Rash. ? Watery poop (diarrhea). ? Throwing up (vomiting). ? Dark pee. ? Serious headache. ? Dizziness. ? Stiff neck. ? Yellow color in your face or the white parts of your eyes (jaundice). Summary  After a blood transfusion, return to your normal activities as told by your doctor.  Every day, check for signs of infection where the IV tube was put into your vein.  Some  signs of infection are warm skin, more redness and pain, more fluid or blood, and pus or a bad smell where the needle went in.  Contact your doctor if you feel weak or have any unusual symptoms. This information is not intended to replace advice given to you by your health care provider. Make sure you discuss any questions you have with your health care provider. Document Released: 05/19/2014 Document Revised: 09/02/2017 Document Reviewed: 12/21/2015 Elsevier Patient Education  2020 [employer]  

## 2018-12-31 NOTE — Progress Notes (Signed)
Pt received 1 unit PRBCs today, tolerated well.  VSS.  Reports feeling better at end of tx.  Ate and drank during without any issues.  IV Lasix given.  Escorted via wc to exit with belongings, relative taking her home.

## 2019-01-01 DIAGNOSIS — L02211 Cutaneous abscess of abdominal wall: Secondary | ICD-10-CM | POA: Diagnosis not present

## 2019-01-01 DIAGNOSIS — I130 Hypertensive heart and chronic kidney disease with heart failure and stage 1 through stage 4 chronic kidney disease, or unspecified chronic kidney disease: Secondary | ICD-10-CM | POA: Diagnosis not present

## 2019-01-01 DIAGNOSIS — E1122 Type 2 diabetes mellitus with diabetic chronic kidney disease: Secondary | ICD-10-CM | POA: Diagnosis not present

## 2019-01-01 DIAGNOSIS — I5033 Acute on chronic diastolic (congestive) heart failure: Secondary | ICD-10-CM | POA: Diagnosis not present

## 2019-01-01 DIAGNOSIS — I5022 Chronic systolic (congestive) heart failure: Secondary | ICD-10-CM | POA: Diagnosis not present

## 2019-01-01 LAB — TYPE AND SCREEN
ABO/RH(D): A POS
Antibody Screen: POSITIVE
Donor AG Type: NEGATIVE
Unit division: 0

## 2019-01-01 LAB — BPAM RBC
Blood Product Expiration Date: 202009172359
ISSUE DATE / TIME: 202008211009
Unit Type and Rh: 6200

## 2019-01-03 ENCOUNTER — Telehealth: Payer: Self-pay | Admitting: *Deleted

## 2019-01-03 ENCOUNTER — Inpatient Hospital Stay: Payer: Medicare Other

## 2019-01-03 ENCOUNTER — Ambulatory Visit
Admission: RE | Admit: 2019-01-03 | Discharge: 2019-01-03 | Disposition: A | Discharge status: Home / Self Care | Payer: Medicare Other | Source: Ambulatory Visit | Attending: Radiation Oncology | Admitting: Radiation Oncology

## 2019-01-03 ENCOUNTER — Other Ambulatory Visit: Payer: Self-pay

## 2019-01-03 ENCOUNTER — Inpatient Hospital Stay (HOSPITAL_BASED_OUTPATIENT_CLINIC_OR_DEPARTMENT_OTHER): Payer: Medicare Other | Admitting: Medical

## 2019-01-03 ENCOUNTER — Ambulatory Visit: Payer: Medicare Other

## 2019-01-03 DIAGNOSIS — Z510 Encounter for antineoplastic radiation therapy: Secondary | ICD-10-CM | POA: Diagnosis not present

## 2019-01-03 DIAGNOSIS — I509 Heart failure, unspecified: Secondary | ICD-10-CM | POA: Diagnosis not present

## 2019-01-03 DIAGNOSIS — E119 Type 2 diabetes mellitus without complications: Secondary | ICD-10-CM | POA: Diagnosis not present

## 2019-01-03 DIAGNOSIS — I110 Hypertensive heart disease with heart failure: Secondary | ICD-10-CM | POA: Diagnosis not present

## 2019-01-03 DIAGNOSIS — C19 Malignant neoplasm of rectosigmoid junction: Secondary | ICD-10-CM | POA: Diagnosis not present

## 2019-01-03 DIAGNOSIS — E039 Hypothyroidism, unspecified: Secondary | ICD-10-CM | POA: Diagnosis not present

## 2019-01-03 DIAGNOSIS — D509 Iron deficiency anemia, unspecified: Secondary | ICD-10-CM | POA: Diagnosis not present

## 2019-01-03 DIAGNOSIS — I2510 Atherosclerotic heart disease of native coronary artery without angina pectoris: Secondary | ICD-10-CM | POA: Diagnosis not present

## 2019-01-03 DIAGNOSIS — C3412 Malignant neoplasm of upper lobe, left bronchus or lung: Secondary | ICD-10-CM

## 2019-01-03 DIAGNOSIS — C3490 Malignant neoplasm of unspecified part of unspecified bronchus or lung: Secondary | ICD-10-CM | POA: Diagnosis not present

## 2019-01-03 LAB — CMP (CANCER CENTER ONLY)
ALT: <6 U/L (ref 0–44)
AST: 14 U/L — ABNORMAL LOW (ref 15–41)
Albumin: 2.9 g/dL — ABNORMAL LOW (ref 3.5–5.0)
Alkaline Phosphatase: 94 U/L (ref 38–126)
Anion gap: 8 (ref 5–15)
BUN: 23 mg/dL (ref 8–23)
CO2: 25 mmol/L (ref 22–32)
Calcium: 8.6 mg/dL — ABNORMAL LOW (ref 8.9–10.3)
Chloride: 105 mmol/L (ref 98–111)
Creatinine: 1.85 mg/dL — ABNORMAL HIGH (ref 0.44–1.00)
GFR, Est AFR Am: 32 mL/min — ABNORMAL LOW (ref >60)
GFR, Estimated: 27 mL/min — ABNORMAL LOW (ref >60)
Glucose, Bld: 86 mg/dL (ref 70–99)
Potassium: 3.9 mmol/L (ref 3.5–5.1)
Sodium: 138 mmol/L (ref 135–145)
Total Bilirubin: 0.3 mg/dL (ref 0.3–1.2)
Total Protein: 6 g/dL — ABNORMAL LOW (ref 6.5–8.1)

## 2019-01-03 LAB — CBC WITH DIFFERENTIAL (CANCER CENTER ONLY)
Abs Immature Granulocytes: 0.04 10*3/uL (ref 0.00–0.07)
Basophils Absolute: 0 10*3/uL (ref 0.0–0.1)
Basophils Relative: 1 %
Eosinophils Absolute: 0.1 10*3/uL (ref 0.0–0.5)
Eosinophils Relative: 2 %
HCT: 27.3 % — ABNORMAL LOW (ref 36.0–46.0)
Hemoglobin: 8.7 g/dL — ABNORMAL LOW (ref 12.0–15.0)
Immature Granulocytes: 2 %
Lymphocytes Relative: 26 %
Lymphs Abs: 0.6 10*3/uL — ABNORMAL LOW (ref 0.7–4.0)
MCH: 32 pg (ref 26.0–34.0)
MCHC: 31.9 g/dL (ref 30.0–36.0)
MCV: 100.4 fL — ABNORMAL HIGH (ref 80.0–100.0)
Monocytes Absolute: 0.5 10*3/uL (ref 0.1–1.0)
Monocytes Relative: 21 %
Neutro Abs: 1.2 10*3/uL — ABNORMAL LOW (ref 1.7–7.7)
Neutrophils Relative %: 48 %
Platelet Count: 182 10*3/uL (ref 150–400)
RBC: 2.72 MIL/uL — ABNORMAL LOW (ref 3.87–5.11)
RDW: 19 % — ABNORMAL HIGH (ref 11.5–15.5)
WBC Count: 2.4 10*3/uL — ABNORMAL LOW (ref 4.0–10.5)
nRBC: 0 % (ref 0.0–0.2)

## 2019-01-03 NOTE — Progress Notes (Signed)
The patient continues with radiation therapy. This provider was called to the front to see this patient. She is having esophagitis despite her medications. I told that patient that I would go to a computer and review her medications. She forbid me to change any of her medications. She states that she simply wanted me to know that she was going to take her Zofran and Phenergan sooner than written as she was told by Dr. Lisbeth Renshaw that this may help with "esophageal cramping." I told the patient that I would send a message to Dr. Burr Medico.  Sandi Mealy, MHS, PA-C Physician Assistant

## 2019-01-03 NOTE — Telephone Encounter (Signed)
CALLED PATIENT TO INFORM OF STAT LABS FOR 01-03-19  @ 11:15 AM @ [place], LVM FOR A RETURN CALL

## 2019-01-03 NOTE — Telephone Encounter (Signed)
CALLED PATIENT TO INFORM OF HER MOM'S STAT LABS ON 01-03-19 @ 11:15 AM @ [place], LVM FOR A RETURN CALL

## 2019-01-03 NOTE — Telephone Encounter (Signed)
CALLED PATIENT TO INFORM OF STAT LABS TODAY @ 11:15 AM, SPOKE WITH PATIENT AND SHE IS AWARE OF THIS APPT.

## 2019-01-04 ENCOUNTER — Ambulatory Visit
Admission: RE | Admit: 2019-01-04 | Discharge: 2019-01-04 | Disposition: A | Discharge status: Home / Self Care | Payer: Medicare Other | Source: Ambulatory Visit | Attending: Radiation Oncology | Admitting: Radiation Oncology

## 2019-01-04 ENCOUNTER — Other Ambulatory Visit: Payer: Self-pay

## 2019-01-04 ENCOUNTER — Ambulatory Visit: Payer: Medicare Other

## 2019-01-04 DIAGNOSIS — E039 Hypothyroidism, unspecified: Secondary | ICD-10-CM | POA: Diagnosis not present

## 2019-01-04 DIAGNOSIS — C3490 Malignant neoplasm of unspecified part of unspecified bronchus or lung: Secondary | ICD-10-CM | POA: Diagnosis not present

## 2019-01-04 DIAGNOSIS — C19 Malignant neoplasm of rectosigmoid junction: Secondary | ICD-10-CM | POA: Diagnosis not present

## 2019-01-04 DIAGNOSIS — E119 Type 2 diabetes mellitus without complications: Secondary | ICD-10-CM | POA: Diagnosis not present

## 2019-01-04 DIAGNOSIS — I509 Heart failure, unspecified: Secondary | ICD-10-CM | POA: Diagnosis not present

## 2019-01-04 DIAGNOSIS — Z510 Encounter for antineoplastic radiation therapy: Secondary | ICD-10-CM | POA: Diagnosis not present

## 2019-01-04 DIAGNOSIS — I110 Hypertensive heart disease with heart failure: Secondary | ICD-10-CM | POA: Diagnosis not present

## 2019-01-04 DIAGNOSIS — C3412 Malignant neoplasm of upper lobe, left bronchus or lung: Secondary | ICD-10-CM | POA: Diagnosis not present

## 2019-01-04 DIAGNOSIS — I2510 Atherosclerotic heart disease of native coronary artery without angina pectoris: Secondary | ICD-10-CM | POA: Diagnosis not present

## 2019-01-04 DIAGNOSIS — D509 Iron deficiency anemia, unspecified: Secondary | ICD-10-CM | POA: Diagnosis not present

## 2019-01-05 ENCOUNTER — Ambulatory Visit
Admission: RE | Admit: 2019-01-05 | Discharge: 2019-01-05 | Disposition: A | Discharge status: Home / Self Care | Payer: Medicare Other | Source: Ambulatory Visit | Attending: Radiation Oncology | Admitting: Radiation Oncology

## 2019-01-05 ENCOUNTER — Other Ambulatory Visit: Payer: Self-pay

## 2019-01-05 DIAGNOSIS — C3412 Malignant neoplasm of upper lobe, left bronchus or lung: Secondary | ICD-10-CM | POA: Diagnosis not present

## 2019-01-05 DIAGNOSIS — E039 Hypothyroidism, unspecified: Secondary | ICD-10-CM | POA: Diagnosis not present

## 2019-01-05 DIAGNOSIS — D509 Iron deficiency anemia, unspecified: Secondary | ICD-10-CM | POA: Diagnosis not present

## 2019-01-05 DIAGNOSIS — Z510 Encounter for antineoplastic radiation therapy: Secondary | ICD-10-CM | POA: Diagnosis not present

## 2019-01-05 DIAGNOSIS — I110 Hypertensive heart disease with heart failure: Secondary | ICD-10-CM | POA: Diagnosis not present

## 2019-01-05 DIAGNOSIS — C3490 Malignant neoplasm of unspecified part of unspecified bronchus or lung: Secondary | ICD-10-CM | POA: Diagnosis not present

## 2019-01-05 DIAGNOSIS — E119 Type 2 diabetes mellitus without complications: Secondary | ICD-10-CM | POA: Diagnosis not present

## 2019-01-05 DIAGNOSIS — I509 Heart failure, unspecified: Secondary | ICD-10-CM | POA: Diagnosis not present

## 2019-01-05 DIAGNOSIS — C19 Malignant neoplasm of rectosigmoid junction: Secondary | ICD-10-CM | POA: Diagnosis not present

## 2019-01-05 DIAGNOSIS — I2510 Atherosclerotic heart disease of native coronary artery without angina pectoris: Secondary | ICD-10-CM | POA: Diagnosis not present

## 2019-01-05 NOTE — Progress Notes (Signed)
[place]   [phone] [phone]   Clinic Follow up Note   Patient Care Team: [name], MD as PCP - General (Internal Medicine) Herminio Commons, MD as PCP - Cardiology (Cardiology) Neldon Labella, RN as [place] Management Truitt Merle, MD as Consulting Physician (Hematology)  Date of Service:  01/06/2019  CHIEF COMPLAINT: F/u Small cell lung cancer, on chemo and RT  SUMMARY OF ONCOLOGIC HISTORY: Oncology History Overview Note  Cancer Staging Primary cancer of hepatic flexure of colon [facility]) Staging form: Colon and Rectum, AJCC 8th Edition - Clinical stage from 09/09/2018: Stage Unknown (cTX, cN0, cM0) - Signed by Truitt Merle, MD on 09/28/2018  Small cell lung cancer, left upper lobe ([place]) Staging form: Lung, AJCC 8th Edition - Clinical stage from 09/14/2018: Stage IIIB (cT3, cN2, cM0) - Signed by Truitt Merle, MD on 09/28/2018     Primary cancer of hepatic flexure of colon ([place]) (Resolved)   Initial Diagnosis   Malignant neoplasm of hepatic flexure ([place])   09/09/2018 Procedure   Coloscopy by Dr Gala Romney 09/09/18  IMPRESSION -Colon mass as described in the vicinity of the hepatic flexure - status post biopsy and inking. Polyps associated with this tumor scattered all the way to the cecum were not removed; multiple splenic flexure and rectal polyps were removed as described above. Left-sided diverticulosis.  Upper endoscopy By Dr Gala Romney 09/09/18  IMPRESSION -Abnormal distal esophagus suspicious for Barrett's esophagus?biopsied. - Small hiatal hernia. - Erosive gastropathy. Biopsied. -Duodenal bulbar erosions   09/09/2018 Initial Biopsy   Diagnosis 09/09/18 1. Stomach, biopsy - GASTRIC ANTRAL MUCOSA WITH MILD NONSPECIFIC REACTIVE GASTROPATHY - GASTRIC OXYNTIC MUCOSA WITH PARIETAL CELL HYPERPLASIA AS CAN BE SEEN IN HYPERGASTRINEMIC STATES SUCH AS PROPER PATIENT IDENTIFICATION THERAPY. - WARTHIN STARRY STAIN IS NEGATIVE FOR  HELICOBACTER PYLORI 2. Esophagus, biopsy - BARRETT'S ESOPHAGUS, NEGATIVE FOR DYSPLASIA 3. Colon, polyp(s), descending - TUBULAR ADENOMA - NEGATIVE FOR HIGH-GRADE DYSPLASIA OR MALIGNANCY 4. Colon, biopsy, hepatic flexure - ADENOCARCINOMA. SEE NOTE 5. Colon, polyp(s), splenic flexure - ADENOCARCINOMA. SEE NOTE - OTHER FRAGMENTS OF TUBULAR ADENOMA(S) WITH ONE SHOWING HIGH-GRADE DYSPLASIA 6. Rectum, polyp(s) - TUBULAR ADENOMA(S) - NEGATIVE FOR HIGH-GRADE DYSPLASIA OR MALIGNANCY   09/09/2018 Cancer Staging   Staging form: Colon and Rectum, AJCC 8th Edition - Clinical stage from 09/09/2018: Stage Unknown (cTX, cN0, cM0) - Signed by Truitt Merle, MD on 09/28/2018   09/11/2018 Imaging   CT CAP 09/11/18  IMPRESSION: 1. Central perihilar left upper lobe 5.4 cm lung mass, new since 01/27/2017 chest CT, compatible with malignancy, favor primary bronchogenic carcinoma. Postobstructive pneumonia/atelectasis in the lingula. 2. Mediastinal adenopathy in the left prevascular/AP window and subcarinal stations, compatible with metastatic nodes (more likely originating from the left upper lobe lung mass). 3. Trace dependent left pleural effusion. 4. Apple-core lesion in the ascending colon, compatible with known primary colonic neoplasm. Adjacent top-normal size right mesenteric lymph node is equivocal for locoregional nodal metastasis. No additional potential findings of metastatic disease in the abdomen or pelvis. 5. Chronic findings include: Aortic Atherosclerosis (ICD10-I70.0). Borderline mild cardiomegaly. One vessel coronary atherosclerosis. Dilated main pulmonary artery, suggesting pulmonary arterial hypertension. Left adrenal adenoma. Punctate bilateral nephrolithiasis. Small hiatal hernia.   09/20/2018 Imaging   Brain MRI 09/20/18  IMPRESSION: 1. Motion degraded examination without brain metastases identified. 2. Small skull metastases not excluded. 3. Moderate to severe chronic small vessel  ischemic disease.   Small cell lung cancer, left upper lobe ([place])  09/11/2018 Imaging  CT CAP 09/11/18  IMPRESSION: 1. Central perihilar left upper lobe 5.4 cm lung mass, new since 01/27/2017 chest CT, compatible with malignancy, favor primary bronchogenic carcinoma. Postobstructive pneumonia/atelectasis in the lingula. 2. Mediastinal adenopathy in the left prevascular/AP window and subcarinal stations, compatible with metastatic nodes (more likely originating from the left upper lobe lung mass). 3. Trace dependent left pleural effusion. 4. Apple-core lesion in the ascending colon, compatible with known primary colonic neoplasm. Adjacent top-normal size right mesenteric lymph node is equivocal for locoregional nodal metastasis. No additional potential findings of metastatic disease in the abdomen or pelvis. 5. Chronic findings include: Aortic Atherosclerosis (ICD10-I70.0). Borderline mild cardiomegaly. One vessel coronary atherosclerosis. Dilated main pulmonary artery, suggesting pulmonary arterial hypertension. Left adrenal adenoma. Punctate bilateral nephrolithiasis. Small hiatal hernia.    09/14/2018 Initial Biopsy   Diagnosis 5/5.20 Lung, biopsy, LUL - SMALL CELL CARCINOMA.   09/14/2018 Cancer Staging   Staging form: Lung, AJCC 8th Edition - Clinical stage from 09/14/2018: Stage IIIB (cT3, cN2, cM0) - Signed by Truitt Merle, MD on 09/28/2018   09/15/2018 Initial Diagnosis   Small cell lung cancer, left upper lobe ([place])   09/18/2018 - 12/17/2018 Chemotherapy   Carboplatin and etoposide every 3 weeks for 4 cycles starting 09/18/18. Held after C2D1 due to COVID-19(+). She restarted with cycle 3 on 11/03/18. Completed 5 cycles on 12/17/18.  RT was concurrnet but started on 11/24/18.   09/20/2018 Imaging   Brain MRI 09/20/18  IMPRESSION: 1. Motion degraded examination without brain metastases identified. 2. Small skull metastases not excluded. 3. Moderate to severe chronic small vessel ischemic  disease.    11/24/2018 - 01/06/2019 Radiation Therapy    RT was postponed to 11/24/18. Only concurrent with chemo for 2 cycles. Completed on 01/06/19      CURRENT THERAPY:  -Carboplatin and etoposideevery3weeks for 4 cycles starting 09/18/18. Held afterC2D1due to COVID-19(+). She restarted with cycle3on 11/03/18.Completed 5 cycles on 12/17/18. -RTstarted7/15/20, plan to complete on 01/06/19  INTERVAL HISTORY:  Stephanie Combs is here for a follow up. She presents to the clinic alone. She notes she completed RT today. She notes she has been nauseous for the last 5 days. She had to double on Carafate. She notes she has to chew a lot in order to swallow better. She notes her skin ulcers are healing.    REVIEW OF SYSTEMS:   Constitutional: Denies fevers, chills or abnormal weight loss Eyes: Denies blurriness of vision Ears, nose, mouth, throat, and face: Denies mucositis or sore throat (+) Odynophagia  Respiratory: Denies cough, dyspnea or wheezes  Cardiovascular: Denies palpitation, chest discomfort or lower extremity swelling Gastrointestinal:  Denies heartburn or change in bowel habits (+) Nauseous  Skin: Denies abnormal skin rashes Lymphatics: Denies new lymphadenopathy or easy bruising Neurological:Denies numbness, tingling or new weaknesses Behavioral/Psych: Mood is stable, no new changes  All other systems were reviewed with the patient and are negative.  MEDICAL HISTORY:  Past Medical History:  Diagnosis Date  . Anxiety   . CAD (coronary artery disease)    a. s/p DES to LAD and angioplasty to D1 in 05/2017  . CHF (congestive heart failure) (HCC)    Diastolic  . Chronic back pain   . Chronic pain   . COPD (chronic obstructive pulmonary disease) ([place])   . Degenerative disk disease   . Diabetes mellitus without complication ([place])   . History of medication noncompliance 11/2017   "the patient frequently self-adjusts medications without notifying her physicians"  .  Hypertension   .  Hypothyroidism   . On home O2    2.5 L N/C prn  . Pedal edema     SURGICAL HISTORY: Past Surgical History:  Procedure Laterality Date  . BIOPSY  09/09/2018   Procedure: BIOPSY;  Surgeon: Daneil Dolin, MD;  [facility];  Service: Endoscopy;;  gastric esophageal hepatic flexure colon  . COLONOSCOPY WITH PROPOFOL N/A 09/09/2018   Procedure: COLONOSCOPY WITH PROPOFOL;  Surgeon: Daneil Dolin, MD;  [facility];  Service: Endoscopy;  Laterality: N/A;  . CORONARY STENT INTERVENTION N/A 05/22/2017   Procedure: CORONARY STENT INTERVENTION;  Surgeon: Martinique, Peter M, MD;  Location: [place] [facility];  Service: Cardiovascular;  Laterality: N/A;  . ESOPHAGOGASTRODUODENOSCOPY (EGD) WITH PROPOFOL N/A 09/09/2018   Procedure: ESOPHAGOGASTRODUODENOSCOPY (EGD) WITH PROPOFOL;  Surgeon: Daneil Dolin, MD;  [facility];  Service: Endoscopy;  Laterality: N/A;  . HERNIA REPAIR    . POLYPECTOMY  09/09/2018   Procedure: POLYPECTOMY;  Surgeon: Daneil Dolin, MD;  [facility];  Service: Endoscopy;;  colon  . RIGHT/LEFT HEART CATH AND CORONARY ANGIOGRAPHY N/A 05/19/2017   Procedure: RIGHT/LEFT HEART CATH AND CORONARY ANGIOGRAPHY;  Surgeon: Leonie Man, MD;  Location: [place] [facility];  Service: Cardiovascular;  Laterality: N/A;  . TUBAL LIGATION    . VIDEO BRONCHOSCOPY WITH ENDOBRONCHIAL ULTRASOUND Left 09/14/2018   Procedure: VIDEO BRONCHOSCOPY WITH ENDOBRONCHIAL ULTRASOUND with biopsies;  Surgeon: Margaretha Seeds, MD;  Location: [place];  Service: Thoracic;  Laterality: Left;    I have reviewed the social history and family history with the patient and they are unchanged from previous note.  ALLERGIES:  is allergic to dilaudid [hydromorphone hcl]; midodrine hcl; actifed cold-allergy [chlorpheniramine-phenylephrine]; doxycycline; other; penicillins; reglan [metoclopramide]; valium [diazepam]; and vistaril [hydroxyzine hcl].  MEDICATIONS:   Current Outpatient Medications  Medication Sig Dispense Refill  . albuterol (PROAIR HFA) 108 (90 Base) MCG/ACT inhaler Inhale 2 puffs into the lungs every 4 (four) hours as needed for wheezing or shortness of breath. For shortness of breath 18 g 0  . albuterol (PROVENTIL) (2.5 MG/3ML) 0.083% nebulizer solution Take 2.5 mg by nebulization 2 (two) times daily as needed for wheezing or shortness of breath.     . allopurinol (ZYLOPRIM) 300 MG tablet Take 1 tablet (300 mg total) by mouth daily for 15 days. 30 tablet 3  . ALPRAZolam (XANAX) 1 MG tablet Take 1 mg by mouth 3 (three) times daily as needed for anxiety or sleep.     Marland Kitchen aspirin EC 81 MG tablet Take 81 mg by mouth daily.    Marland Kitchen atorvastatin (LIPITOR) 80 MG tablet Take 1 tablet (80 mg total) by mouth daily at 6 PM. 30 tablet 0  . cholecalciferol (VITAMIN D3) 25 MCG (1000 UT) tablet Take 5,000 Units by mouth daily.    . furosemide (LASIX) 40 MG tablet Take 60 mg (1 1/2 tabs) in the morning and 40 mg (1 tab) in the afternoon 75 tablet 6  . gabapentin (NEURONTIN) 600 MG tablet Take 600 mg by mouth 3 (three) times daily.     . Hydrocortisone (GERHARDT'S BUTT CREAM) CREA Apply 1 application topically 4 (four) times daily as needed for irritation. 1 each 3  . insulin aspart (NOVOLOG) 100 UNIT/ML injection Inject 1-18 Units into the skin 3 (three) times daily with meals. (Patient taking differently: Inject 0-20 Units into the skin 4 (four) times daily -  before meals and at bedtime. )    .  insulin detemir (LEVEMIR) 100 UNIT/ML injection Inject 0.4 mLs (40 Units total) into the skin at bedtime. 10 mL 0  . levothyroxine (SYNTHROID, LEVOTHROID) 200 MCG tablet Take 1 tablet by mouth daily before breakfast.     . nitroGLYCERIN (NITROSTAT) 0.4 MG SL tablet Place 0.4 mg under the tongue every 5 (five) minutes as needed for chest pain.     Marland Kitchen nystatin (MYCOSTATIN/NYSTOP) powder Apply topically 2 (two) times daily. 15 g 0  . Omega-3 Fatty Acids (FISH OIL PO) Take  1 capsule by mouth daily.     . ondansetron (ZOFRAN) 8 MG tablet Take 1 tablet (8 mg total) by mouth every 8 (eight) hours as needed for nausea or vomiting. 120 tablet 2  . oxyCODONE-acetaminophen (PERCOCET) 10-325 MG tablet Take 1 tablet by mouth every 6 (six) hours as needed for pain.    . OXYGEN Inhale 2 L into the lungs continuous.    . pantoprazole (PROTONIX) 40 MG tablet Take 1 tablet (40 mg total) by mouth 2 (two) times daily. 180 tablet 3  . polyethylene glycol (MIRALAX / GLYCOLAX) 17 g packet Take 17 g by mouth 2 (two) times daily. 14 each 0  . potassium chloride (MICRO-K) 10 MEQ CR capsule Take 2 capsules (20 mEq total) by mouth 2 (two) times daily. *May take one additional tablet as needed for cramping 120 capsule 3  . prochlorperazine (COMPAZINE) 10 MG tablet Take 1 tablet (10 mg total) by mouth every 6 (six) hours as needed for nausea or vomiting. 30 tablet 0  . promethazine (PHENERGAN) 12.5 MG tablet Take 1 tablet (12.5 mg total) by mouth every 6 (six) hours as needed for nausea or vomiting. 45 tablet 1  . senna (SENOKOT) 8.6 MG TABS tablet Take 2 tablets (17.2 mg total) by mouth 2 (two) times daily with a meal. 120 each 0  . sucralfate (CARAFATE) 1 g tablet Take 1 tablet (1 g total) by mouth 4 (four) times daily -  with meals and at bedtime. 5 min before meals for radiation induced esophagitis 120 tablet 2  . SURE COMFORT INS SYR 1CC/28G 28G X 1/2" 1 ML MISC USE TO INJECT INSULIN UP TO 4 TIMES DAILY. 150 each 5  . tiZANidine (ZANAFLEX) 4 MG tablet Take 1 tablet (4 mg total) by mouth every 8 (eight) hours as needed for muscle spasms. 30 tablet 0  . topiramate (TOPAMAX) 25 MG tablet Take 75 mg by mouth at bedtime.     Marland Kitchen umeclidinium-vilanterol (ANORO ELLIPTA) 62.5-25 MCG/INH AEPB Inhale 1 puff into the lungs daily. 30 each 0   No current facility-administered medications for this visit.     PHYSICAL EXAMINATION: ECOG PERFORMANCE STATUS: 3 - Symptomatic, >50% confined to bed   Vitals:   01/06/19 1245  BP: 128/63  Pulse: 84  Resp: 18  Temp: 98.7 F (37.1 C)  SpO2: 98%   Filed Weights   01/06/19 1245  Weight: 282 lb 11.2 oz (128.2 kg)    GENERAL:alert, no distress and comfortable SKIN: skin color, texture, turgor are normal, no rashes or significant lesions EYES: normal, Conjunctiva are pink and non-injected, sclera clear  NECK: supple, thyroid normal size, non-tender, without nodularity LYMPH:  no palpable lymphadenopathy in the cervical, axillary  LUNGS: clear to auscultation and percussion with normal breathing effort HEART: regular rate & rhythm and no murmurs and no lower extremity edema ABDOMEN:abdomen soft, non-tender and normal bowel sounds  Musculoskeletal:no cyanosis of digits and no clubbing  NEURO: alert & oriented x  3 with fluent speech, no focal motor/sensory deficits BREAST: (+) Inner left breast ulcer is healing.   LABORATORY DATA:  I have reviewed the data as listed CBC Latest Ref Rng & Units 01/06/2019 01/03/2019 12/30/2018  WBC 4.0 - 10.5 K/uL 4.3 2.4(L) 1.4(L)  Hemoglobin 12.0 - 15.0 g/dL 9.3(L) 8.7(L) 7.2(L)  Hematocrit 36.0 - 46.0 % 29.6(L) 27.3(L) 22.7(L)  Platelets 150 - 400 K/uL 202 182 111(L)     CMP Latest Ref Rng & Units 01/06/2019 01/03/2019 12/30/2018  Glucose 70 - 99 mg/dL 157(H) 86 163(H)  BUN 8 - 23 mg/dL 17 23 28(H)  Creatinine 0.44 - 1.00 mg/dL 1.62(H) 1.85(H) 2.34(H)  Sodium 135 - 145 mmol/L 140 138 136  Potassium 3.5 - 5.1 mmol/L 3.8 3.9 4.2  Chloride 98 - 111 mmol/L 104 105 103  CO2 22 - 32 mmol/L '26 25 22  ' Calcium 8.9 - 10.3 mg/dL 8.7(L) 8.6(L) 9.1  Total Protein 6.5 - 8.1 g/dL 6.4(L) 6.0(L) 6.5  Total Bilirubin 0.3 - 1.2 mg/dL 0.3 0.3 0.6  Alkaline Phos 38 - 126 U/L 109 94 107  AST 15 - 41 U/L 11(L) 14(L) 11(L)  ALT 0 - 44 U/L 6 <6 8      RADIOGRAPHIC STUDIES: I have personally reviewed the radiological images as listed and agreed with the findings in the report. No results found.   ASSESSMENT &  PLAN:  JOYA WILLMOTT is a 68 y.o. female with   1. Small Cell Lung cancer, LUL, cT3N2M0, stage III, limited stage  -Newly diagnosedin 5/2020during her work up for colon cancer -Ipreviouslydiscussed her lung cancer would take priority in terms of treatment given theaggressivenessof small cell lung cancer, and very high risk of recurrence evenwith curative intentconcurrentchemoradiation.  -She completed last cycle chemo on 8/7, with significant dose reduction. -She is tolerating radiation moderate well, but much worse this week with more fatigue, moderate esophagitis and skin toxicity. She completed today.  -She notes nausea and odynophagia. She will continue Carafate and alternating antiemetics. I discussed this will improve over the next few weeks as she completed RT today. Her skin ulcers are healing, she will continue topical treatment. -Labs reviewed, CBC and CMP WNL except Hg 9.3, BG 157, Cr 1.62, Ca 8.7, Protein 6.4, Albumin 3.1. Her cytopenia has much improved -Virtual visit in 2 weeks  -F/u with me in 4 weeks   2. 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  -Unfortunately she is not a candidate for colon cancer surgery due to very high risk ofsurgical mortality.She was seen by surgery during her recent hospitalization.  -I discussed the incurablenature of her colon cancer without surgery. -Willcontinueto monitor on current treatment for her SCLC. -if she rec[facility], will consider low intensity chemo such as Xeloda or Irinotecan (which works for [employer] also). Given normal MMR, she is not a candidate for immunotherapy. Given primary right side colon cancer, not much benefit from EGFR inhibitors even if KRAS/NRAS/BRAF wild type. -I discussed treatment with chemo would be palliative to control her disease. She would continue for as long  as she can tolerate she progresses.   3. 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 -anemia slightly worse due to chemo -Continue monitoring -Last blood transfusion on 01/05/19 -hg 9.3 today (01/06/19)  4. CAD, CHF, HTN, significant LE edema, CKD -OnLipitor, lasix, nitro -Continue to  f/u with cardiologist. -Her CHFand fluid overload has muchimproved during her recent hospitalization. -She has been on 67m Lasix in the morning.She responded well to iv lasixwhich I give during his chemo -I held her oral lasix and gave iv lasix416mailyduring chemowhich she responded very well. With completion of chemo she takes alternating Lasix dose.  -LE is improving.   5.DM,hypothyroidism, morbid obesity -On insulin novolog and long acting levemir. OnLevothyroxine. -Willmonitor glucose closely during her chemo treatment -stable  6.Rheumatoidarthritis, chronic back pain  -On Percocet which she takes 4 times a day.  -She is also hasGabapentin.  -She use to receiveinjectionfor her back -Pain chronic, stable. She will continue to see her RA and pain specialist.   7. Depression and anxiety -She has been more depressed with recent diagnosis of 2 cancers -She is currently on Celebrex, will continue. -stable   8.Goal of care discussion, DNR/DNI -We previously discussed the incurable nature of her cancer, and the overall poor prognosis, especially ifshe does not have good response to chemotherapy or progress on chemo -The patient understands the goal of care is palliative. -I recommend DNR/DNI,she has agreed to DRI/DNR.She has written will and has established her daughter as her POA.  9.History ofCOVID-19 -She was incidentally found to be positive for COVID-19on 10/11/2018, no respiratory symptoms or evidence of pneumonia,she hasGIsymptoms which could  be relate -Her testing on 11/04/18 was positive again. -no COVID symptoms  10. Neutropenia and thrombocytopenia, secondary to chemotherapy -resolved today  -Monitor CBC closely  Plan: -she has finished radiation today -Phone visit in 2 weeks  -Lab and f/u in 4 weeks    No problem-specific Assessment & Plan notes found for this encounter.   No orders of the defined types were placed in this encounter.  All questions were answered. The patient knows to call the clinic with any problems, questions or concerns. No barriers to learning was detected. I spent 15 minutes counseling the patient face to face. The total time spent in the appointment was 20 minutes and more than 50% was on counseling and review of test results     YaTruitt MerleMD 01/06/2019   I, AmJoslyn Devonam acting as scribe for YaTruitt MerleMD.   I have reviewed the above documentation for accuracy and completeness, and I agree with the above.

## 2019-01-06 ENCOUNTER — Encounter: Payer: Self-pay | Admitting: Radiation Oncology

## 2019-01-06 ENCOUNTER — Telehealth: Payer: Self-pay | Admitting: Hematology

## 2019-01-06 ENCOUNTER — Other Ambulatory Visit: Payer: Self-pay

## 2019-01-06 ENCOUNTER — Inpatient Hospital Stay: Payer: Medicare Other

## 2019-01-06 ENCOUNTER — Encounter: Payer: Self-pay | Admitting: Hematology

## 2019-01-06 ENCOUNTER — Inpatient Hospital Stay (HOSPITAL_BASED_OUTPATIENT_CLINIC_OR_DEPARTMENT_OTHER): Payer: Medicare Other | Admitting: Hematology

## 2019-01-06 ENCOUNTER — Ambulatory Visit
Admission: RE | Admit: 2019-01-06 | Discharge: 2019-01-06 | Disposition: A | Discharge status: Home / Self Care | Payer: Medicare Other | Source: Ambulatory Visit | Attending: Radiation Oncology | Admitting: Radiation Oncology

## 2019-01-06 VITALS — BP 128/63 | HR 84 | Temp 98.7°F | Resp 18 | Ht 61.0 in | Wt 282.7 lb

## 2019-01-06 DIAGNOSIS — E1122 Type 2 diabetes mellitus with diabetic chronic kidney disease: Secondary | ICD-10-CM

## 2019-01-06 DIAGNOSIS — C3412 Malignant neoplasm of upper lobe, left bronchus or lung: Secondary | ICD-10-CM | POA: Diagnosis not present

## 2019-01-06 DIAGNOSIS — N183 Chronic kidney disease, stage 3 unspecified: Secondary | ICD-10-CM

## 2019-01-06 DIAGNOSIS — D509 Iron deficiency anemia, unspecified: Secondary | ICD-10-CM | POA: Diagnosis not present

## 2019-01-06 DIAGNOSIS — Z5111 Encounter for antineoplastic chemotherapy: Secondary | ICD-10-CM | POA: Diagnosis not present

## 2019-01-06 DIAGNOSIS — C19 Malignant neoplasm of rectosigmoid junction: Secondary | ICD-10-CM | POA: Diagnosis not present

## 2019-01-06 DIAGNOSIS — Z794 Long term (current) use of insulin: Secondary | ICD-10-CM | POA: Diagnosis not present

## 2019-01-06 DIAGNOSIS — E039 Hypothyroidism, unspecified: Secondary | ICD-10-CM | POA: Diagnosis not present

## 2019-01-06 DIAGNOSIS — I5032 Chronic diastolic (congestive) heart failure: Secondary | ICD-10-CM

## 2019-01-06 DIAGNOSIS — C3490 Malignant neoplasm of unspecified part of unspecified bronchus or lung: Secondary | ICD-10-CM | POA: Diagnosis not present

## 2019-01-06 DIAGNOSIS — I110 Hypertensive heart disease with heart failure: Secondary | ICD-10-CM | POA: Diagnosis not present

## 2019-01-06 DIAGNOSIS — T451X5A Adverse effect of antineoplastic and immunosuppressive drugs, initial encounter: Secondary | ICD-10-CM | POA: Diagnosis not present

## 2019-01-06 DIAGNOSIS — I2510 Atherosclerotic heart disease of native coronary artery without angina pectoris: Secondary | ICD-10-CM

## 2019-01-06 DIAGNOSIS — I10 Essential (primary) hypertension: Secondary | ICD-10-CM | POA: Diagnosis not present

## 2019-01-06 DIAGNOSIS — E119 Type 2 diabetes mellitus without complications: Secondary | ICD-10-CM | POA: Diagnosis not present

## 2019-01-06 DIAGNOSIS — C183 Malignant neoplasm of hepatic flexure: Secondary | ICD-10-CM | POA: Diagnosis not present

## 2019-01-06 DIAGNOSIS — N182 Chronic kidney disease, stage 2 (mild): Secondary | ICD-10-CM

## 2019-01-06 DIAGNOSIS — I509 Heart failure, unspecified: Secondary | ICD-10-CM | POA: Diagnosis not present

## 2019-01-06 DIAGNOSIS — Z7982 Long term (current) use of aspirin: Secondary | ICD-10-CM | POA: Diagnosis not present

## 2019-01-06 DIAGNOSIS — D61810 Antineoplastic chemotherapy induced pancytopenia: Secondary | ICD-10-CM | POA: Diagnosis not present

## 2019-01-06 DIAGNOSIS — Z510 Encounter for antineoplastic radiation therapy: Secondary | ICD-10-CM | POA: Diagnosis not present

## 2019-01-06 DIAGNOSIS — M069 Rheumatoid arthritis, unspecified: Secondary | ICD-10-CM | POA: Diagnosis not present

## 2019-01-06 DIAGNOSIS — Z8619 Personal history of other infectious and parasitic diseases: Secondary | ICD-10-CM | POA: Diagnosis not present

## 2019-01-06 DIAGNOSIS — Z79899 Other long term (current) drug therapy: Secondary | ICD-10-CM | POA: Diagnosis not present

## 2019-01-06 LAB — CMP (CANCER CENTER ONLY)
ALT: 6 U/L (ref 0–44)
AST: 11 U/L — ABNORMAL LOW (ref 15–41)
Albumin: 3.1 g/dL — ABNORMAL LOW (ref 3.5–5.0)
Alkaline Phosphatase: 109 U/L (ref 38–126)
Anion gap: 10 (ref 5–15)
BUN: 17 mg/dL (ref 8–23)
CO2: 26 mmol/L (ref 22–32)
Calcium: 8.7 mg/dL — ABNORMAL LOW (ref 8.9–10.3)
Chloride: 104 mmol/L (ref 98–111)
Creatinine: 1.62 mg/dL — ABNORMAL HIGH (ref 0.44–1.00)
GFR, Est AFR Am: 37 mL/min — ABNORMAL LOW (ref >60)
GFR, Estimated: 32 mL/min — ABNORMAL LOW (ref >60)
Glucose, Bld: 157 mg/dL — ABNORMAL HIGH (ref 70–99)
Potassium: 3.8 mmol/L (ref 3.5–5.1)
Sodium: 140 mmol/L (ref 135–145)
Total Bilirubin: 0.3 mg/dL (ref 0.3–1.2)
Total Protein: 6.4 g/dL — ABNORMAL LOW (ref 6.5–8.1)

## 2019-01-06 LAB — CBC WITH DIFFERENTIAL (CANCER CENTER ONLY)
Abs Immature Granulocytes: 0.05 10*3/uL (ref 0.00–0.07)
Basophils Absolute: 0.1 10*3/uL (ref 0.0–0.1)
Basophils Relative: 1 %
Eosinophils Absolute: 0.1 10*3/uL (ref 0.0–0.5)
Eosinophils Relative: 2 %
HCT: 29.6 % — ABNORMAL LOW (ref 36.0–46.0)
Hemoglobin: 9.3 g/dL — ABNORMAL LOW (ref 12.0–15.0)
Immature Granulocytes: 1 %
Lymphocytes Relative: 13 %
Lymphs Abs: 0.5 10*3/uL — ABNORMAL LOW (ref 0.7–4.0)
MCH: 31.5 pg (ref 26.0–34.0)
MCHC: 31.4 g/dL (ref 30.0–36.0)
MCV: 100.3 fL — ABNORMAL HIGH (ref 80.0–100.0)
Monocytes Absolute: 0.4 10*3/uL (ref 0.1–1.0)
Monocytes Relative: 10 %
Neutro Abs: 3.2 10*3/uL (ref 1.7–7.7)
Neutrophils Relative %: 73 %
Platelet Count: 202 10*3/uL (ref 150–400)
RBC: 2.95 MIL/uL — ABNORMAL LOW (ref 3.87–5.11)
RDW: 18.6 % — ABNORMAL HIGH (ref 11.5–15.5)
WBC Count: 4.3 10*3/uL (ref 4.0–10.5)
nRBC: 0 % (ref 0.0–0.2)

## 2019-01-06 NOTE — Telephone Encounter (Signed)
Scheduled appt per 8/27 los.  Spoke with patient and she is aware of her appt date and time.  Per the patient request..printed and mailed appt calendar

## 2019-01-07 ENCOUNTER — Other Ambulatory Visit: Payer: Self-pay

## 2019-01-07 NOTE — Patient Outreach (Signed)
[facility]) Care Management  01/07/2019  MARCIE SHEARON 10/08/1950 [id number]   Brief outreach with Ms. Hobin. She reports feeling very tired today. Denies complaints of shortness of breath, chest pain or palpitations. No complaints of dizziness. Reports taking frequent rest breaks and avoiding overexertion as recommended. Reports ambulating well with her walker. Denies falls.  She reports compliance with medications. No new concerns regarding medication adherence or prescription costs.  Reports monitoring her fasting blood glucose daily. Recalls ranges from 100's to 120's over the past week. Morning FBS - 121mg /dl.  Ms. Mangen confirmed receipt of the [facility] meals. Declined need for additional community resource referrals. Agreed to contact RNCM with urgent concerns or if needed prior to the next telephonic outreach. PLAN -Will continue routine outreach.  [place] Care Management [phone]

## 2019-01-08 DIAGNOSIS — I5022 Chronic systolic (congestive) heart failure: Secondary | ICD-10-CM | POA: Diagnosis not present

## 2019-01-08 DIAGNOSIS — E1122 Type 2 diabetes mellitus with diabetic chronic kidney disease: Secondary | ICD-10-CM | POA: Diagnosis not present

## 2019-01-08 DIAGNOSIS — I5033 Acute on chronic diastolic (congestive) heart failure: Secondary | ICD-10-CM | POA: Diagnosis not present

## 2019-01-08 DIAGNOSIS — I130 Hypertensive heart and chronic kidney disease with heart failure and stage 1 through stage 4 chronic kidney disease, or unspecified chronic kidney disease: Secondary | ICD-10-CM | POA: Diagnosis not present

## 2019-01-08 DIAGNOSIS — L02211 Cutaneous abscess of abdominal wall: Secondary | ICD-10-CM | POA: Diagnosis not present

## 2019-01-14 DIAGNOSIS — J449 Chronic obstructive pulmonary disease, unspecified: Secondary | ICD-10-CM | POA: Diagnosis not present

## 2019-01-14 NOTE — Progress Notes (Signed)
[facility]   [phone] [phone]   Clinic Follow up Note   Patient Care Team: [name], MD as PCP - General (Internal Medicine) Herminio Commons, MD as PCP - Cardiology (Cardiology) Neldon Labella, RN as [facility] Management Truitt Merle, MD as Consulting Physician (Hematology)   I connected with Stephanie Combs on 01/20/2019 at  1:20 PM EDT by telephone visit and verified that I am speaking with the correct person using two identifiers.  I discussed the limitations, risks, security and privacy concerns of performing an evaluation and management service by telephone and the availability of in person appointments. I also discussed with the patient that there may be a patient responsible charge related to this service. The patient expressed understanding and agreed to proceed.    Patient's location:  Her home  Provider's location:  My office   CHIEF COMPLAINT: F/u Small cell lung cancer  SUMMARY OF ONCOLOGIC HISTORY: Oncology History Overview Note  Cancer Staging Primary cancer of hepatic flexure of colon ([place]) Staging form: Colon and Rectum, AJCC 8th Edition - Clinical stage from 09/09/2018: Stage Unknown (cTX, cN0, cM0) - Signed by Truitt Merle, MD on 09/28/2018  Small cell lung cancer, left upper lobe ([place]) Staging form: Lung, AJCC 8th Edition - Clinical stage from 09/14/2018: Stage IIIB (cT3, cN2, cM0) - Signed by Truitt Merle, MD on 09/28/2018     Primary cancer of hepatic flexure of colon [facility])   Initial Diagnosis   Malignant neoplasm of hepatic flexure ([place])   09/09/2018 Procedure   Coloscopy by Dr Gala Romney 09/09/18  IMPRESSION -Colon mass as described in the vicinity of the hepatic flexure - status post biopsy and inking. Polyps associated with this tumor scattered all the way to the cecum were not removed; multiple splenic flexure and rectal polyps were removed as described above. Left-sided diverticulosis.  Upper  endoscopy By Dr Gala Romney 09/09/18  IMPRESSION -Abnormal distal esophagus suspicious for Barrett's esophagus?biopsied. - Small hiatal hernia. - Erosive gastropathy. Biopsied. -Duodenal bulbar erosions   09/09/2018 Initial Biopsy   Diagnosis 09/09/18 1. Stomach, biopsy - GASTRIC ANTRAL MUCOSA WITH MILD NONSPECIFIC REACTIVE GASTROPATHY - GASTRIC OXYNTIC MUCOSA WITH PARIETAL CELL HYPERPLASIA AS CAN BE SEEN IN HYPERGASTRINEMIC STATES SUCH AS PROPER PATIENT IDENTIFICATION THERAPY. - WARTHIN STARRY STAIN IS NEGATIVE FOR HELICOBACTER PYLORI 2. Esophagus, biopsy - BARRETT'S ESOPHAGUS, NEGATIVE FOR DYSPLASIA 3. Colon, polyp(s), descending - TUBULAR ADENOMA - NEGATIVE FOR HIGH-GRADE DYSPLASIA OR MALIGNANCY 4. Colon, biopsy, hepatic flexure - ADENOCARCINOMA. SEE NOTE 5. Colon, polyp(s), splenic flexure - ADENOCARCINOMA. SEE NOTE - OTHER FRAGMENTS OF TUBULAR ADENOMA(S) WITH ONE SHOWING HIGH-GRADE DYSPLASIA 6. Rectum, polyp(s) - TUBULAR ADENOMA(S) - NEGATIVE FOR HIGH-GRADE DYSPLASIA OR MALIGNANCY   09/09/2018 Cancer Staging   Staging form: Colon and Rectum, AJCC 8th Edition - Clinical stage from 09/09/2018: Stage Unknown (cTX, cN0, cM0) - Signed by Truitt Merle, MD on 09/28/2018   09/11/2018 Imaging   CT CAP 09/11/18  IMPRESSION: 1. Central perihilar left upper lobe 5.4 cm lung mass, new since 01/27/2017 chest CT, compatible with malignancy, favor primary bronchogenic carcinoma. Postobstructive pneumonia/atelectasis in the lingula. 2. Mediastinal adenopathy in the left prevascular/AP window and subcarinal stations, compatible with metastatic nodes (more likely originating from the left upper lobe lung mass). 3. Trace dependent left pleural effusion. 4. Apple-core lesion in the ascending colon, compatible with known primary colonic neoplasm. Adjacent top-normal size right mesenteric lymph node is equivocal for locoregional nodal metastasis. No additional potential findings of metastatic  disease in the  abdomen or pelvis. 5. Chronic findings include: Aortic Atherosclerosis (ICD10-I70.0). Borderline mild cardiomegaly. One vessel coronary atherosclerosis. Dilated main pulmonary artery, suggesting pulmonary arterial hypertension. Left adrenal adenoma. Punctate bilateral nephrolithiasis. Small hiatal hernia.   09/20/2018 Imaging   Brain MRI 09/20/18  IMPRESSION: 1. Motion degraded examination without brain metastases identified. 2. Small skull metastases not excluded. 3. Moderate to severe chronic small vessel ischemic disease.   Small cell lung cancer, left upper lobe ([place])  09/11/2018 Imaging   CT CAP 09/11/18  IMPRESSION: 1. Central perihilar left upper lobe 5.4 cm lung mass, new since 01/27/2017 chest CT, compatible with malignancy, favor primary bronchogenic carcinoma. Postobstructive pneumonia/atelectasis in the lingula. 2. Mediastinal adenopathy in the left prevascular/AP window and subcarinal stations, compatible with metastatic nodes (more likely originating from the left upper lobe lung mass). 3. Trace dependent left pleural effusion. 4. Apple-core lesion in the ascending colon, compatible with known primary colonic neoplasm. Adjacent top-normal size right mesenteric lymph node is equivocal for locoregional nodal metastasis. No additional potential findings of metastatic disease in the abdomen or pelvis. 5. Chronic findings include: Aortic Atherosclerosis (ICD10-I70.0). Borderline mild cardiomegaly. One vessel coronary atherosclerosis. Dilated main pulmonary artery, suggesting pulmonary arterial hypertension. Left adrenal adenoma. Punctate bilateral nephrolithiasis. Small hiatal hernia.    09/14/2018 Initial Biopsy   Diagnosis 5/5.20 Lung, biopsy, LUL - SMALL CELL CARCINOMA.   09/14/2018 Cancer Staging   Staging form: Lung, AJCC 8th Edition - Clinical stage from 09/14/2018: Stage IIIB (cT3, cN2, cM0) - Signed by Truitt Merle, MD on 09/28/2018   09/15/2018 Initial Diagnosis    Small cell lung cancer, left upper lobe ([place])   09/18/2018 - 12/17/2018 Chemotherapy   Carboplatin and etoposide every 3 weeks for 4 cycles starting 09/18/18. Held after C2D1 due to COVID-19(+). She restarted with cycle 3 on 11/03/18. Completed 5 cycles on 12/17/18.  RT was concurrnet but started on 11/24/18.   09/20/2018 Imaging   Brain MRI 09/20/18  IMPRESSION: 1. Motion degraded examination without brain metastases identified. 2. Small skull metastases not excluded. 3. Moderate to severe chronic small vessel ischemic disease.    11/24/2018 - 01/06/2019 Radiation Therapy    RT was postponed to 11/24/18. Only concurrent with chemo for 2 cycles. Completed on 01/06/19      CURRENT THERAPY:  Observation   INTERVAL HISTORY:  Stephanie Combs is here for a follow up. She notes she has abdominal issues. She is not eating enough due to this. Eating to full will cause her nausea. She can keep down 1 ensure can a day. She notes she also has a lot of gas. She has been constipated which she has been managing with miralax. She had a BM last week. She notes her legs are swollen R>L. She continues to wrap it. She still takes oral lasix 30m in the AM and 234mat night. She denies fever but notes she still feels cold.  She notes she needs a refill for her Lipitor. She will get refill through her PCP if needed.    REVIEW OF SYSTEMS:   Constitutional: Denies fevers or abnormal weight loss (+) fair eating (+) chills  Eyes: Denies blurriness of vision Ears, nose, mouth, throat, and face: Denies mucositis or sore throat Respiratory: Denies cough, dyspnea or wheezes Cardiovascular: Denies palpitation, chest discomfort (+)  lower extremity swelling, R>L Gastrointestinal:  Denies nausea, heartburn (+) mild constipation (+) abdominal pain (+) gas Skin: Denies abnormal skin rashes Lymphatics: Denies new lymphadenopathy or easy bruising Neurological:Denies  numbness, tingling or new weaknesses Behavioral/Psych: Mood is  stable, no new changes  All other systems were reviewed with the patient and are negative.  MEDICAL HISTORY:  Past Medical History:  Diagnosis Date  . Anxiety   . CAD (coronary artery disease)    a. s/p DES to LAD and angioplasty to D1 in 05/2017  . CHF (congestive heart failure) ([facility])    Diastolic  . Chronic back pain   . Chronic pain   . COPD (chronic obstructive pulmonary disease) ([place])   . Degenerative disk disease   . Diabetes mellitus without complication ([place])   . History of medication noncompliance 11/2017   "the patient frequently self-adjusts medications without notifying her physicians"  . Hypertension   . Hypothyroidism   . On home O2    2.5 L N/C prn  . Pedal edema     SURGICAL HISTORY: Past Surgical History:  Procedure Laterality Date  . BIOPSY  09/09/2018   Procedure: BIOPSY;  Surgeon: Daneil Dolin, MD;  [facility];  Service: Endoscopy;;  gastric esophageal hepatic flexure colon  . COLONOSCOPY WITH PROPOFOL N/A 09/09/2018   Procedure: COLONOSCOPY WITH PROPOFOL;  Surgeon: Daneil Dolin, MD;  [facility];  Service: Endoscopy;  Laterality: N/A;  . CORONARY STENT INTERVENTION N/A 05/22/2017   Procedure: CORONARY STENT INTERVENTION;  Surgeon: Martinique, Peter M, MD;  Location: [facility];  Service: Cardiovascular;  Laterality: N/A;  . ESOPHAGOGASTRODUODENOSCOPY (EGD) WITH PROPOFOL N/A 09/09/2018   Procedure: ESOPHAGOGASTRODUODENOSCOPY (EGD) WITH PROPOFOL;  Surgeon: Daneil Dolin, MD;  [facility];  Service: Endoscopy;  Laterality: N/A;  . HERNIA REPAIR    . POLYPECTOMY  09/09/2018   Procedure: POLYPECTOMY;  Surgeon: Daneil Dolin, MD;  [facility];  Service: Endoscopy;;  colon  . RIGHT/LEFT HEART CATH AND CORONARY ANGIOGRAPHY N/A 05/19/2017   Procedure: RIGHT/LEFT HEART CATH AND CORONARY ANGIOGRAPHY;  Surgeon: Leonie Man, MD;  Location: [facility];  Service: Cardiovascular;  Laterality: N/A;  .  TUBAL LIGATION    . VIDEO BRONCHOSCOPY WITH ENDOBRONCHIAL ULTRASOUND Left 09/14/2018   Procedure: VIDEO BRONCHOSCOPY WITH ENDOBRONCHIAL ULTRASOUND with biopsies;  Surgeon: Margaretha Seeds, MD;  Location: [facility];  Service: Thoracic;  Laterality: Left;    I have reviewed the social history and family history with the patient and they are unchanged from previous note.  ALLERGIES:  is allergic to dilaudid [hydromorphone hcl]; midodrine hcl; actifed cold-allergy [chlorpheniramine-phenylephrine]; doxycycline; other; penicillins; reglan [metoclopramide]; valium [diazepam]; and vistaril [hydroxyzine hcl].  MEDICATIONS:  Current Outpatient Medications  Medication Sig Dispense Refill  . albuterol (PROAIR HFA) 108 (90 Base) MCG/ACT inhaler Inhale 2 puffs into the lungs every 4 (four) hours as needed for wheezing or shortness of breath. For shortness of breath 18 g 0  . albuterol (PROVENTIL) (2.5 MG/3ML) 0.083% nebulizer solution Take 2.5 mg by nebulization 2 (two) times daily as needed for wheezing or shortness of breath.     . allopurinol (ZYLOPRIM) 300 MG tablet Take 1 tablet (300 mg total) by mouth daily for 15 days. 30 tablet 3  . ALPRAZolam (XANAX) 1 MG tablet Take 1 mg by mouth 3 (three) times daily as needed for anxiety or sleep.     Marland Kitchen aspirin EC 81 MG tablet Take 81 mg by mouth daily.    Marland Kitchen atorvastatin (LIPITOR) 80 MG tablet Take 1 tablet (80 mg total) by mouth daily at 6 PM. 30 tablet 0  . cholecalciferol (  VITAMIN D3) 25 MCG (1000 UT) tablet Take 5,000 Units by mouth daily.    . furosemide (LASIX) 40 MG tablet Take 60 mg (1 1/2 tabs) in the morning and 40 mg (1 tab) in the afternoon 75 tablet 6  . gabapentin (NEURONTIN) 600 MG tablet Take 600 mg by mouth 3 (three) times daily.     . Hydrocortisone (GERHARDT'S BUTT CREAM) CREA Apply 1 application topically 4 (four) times daily as needed for irritation. 1 each 3  . insulin aspart (NOVOLOG) 100 UNIT/ML injection Inject 1-18 Units into the skin 3  (three) times daily with meals. (Patient taking differently: Inject 0-20 Units into the skin 4 (four) times daily -  before meals and at bedtime. )    . insulin detemir (LEVEMIR) 100 UNIT/ML injection Inject 0.4 mLs (40 Units total) into the skin at bedtime. 10 mL 0  . levothyroxine (SYNTHROID, LEVOTHROID) 200 MCG tablet Take 1 tablet by mouth daily before breakfast.     . nitroGLYCERIN (NITROSTAT) 0.4 MG SL tablet Place 0.4 mg under the tongue every 5 (five) minutes as needed for chest pain.     Marland Kitchen nystatin (MYCOSTATIN/NYSTOP) powder Apply topically 2 (two) times daily. 15 g 0  . Omega-3 Fatty Acids (FISH OIL PO) Take 1 capsule by mouth daily.     . ondansetron (ZOFRAN) 8 MG tablet Take 1 tablet (8 mg total) by mouth every 8 (eight) hours as needed for nausea or vomiting. 120 tablet 2  . oxyCODONE-acetaminophen (PERCOCET) 10-325 MG tablet Take 1 tablet by mouth every 6 (six) hours as needed for pain.    . OXYGEN Inhale 2 L into the lungs continuous.    . pantoprazole (PROTONIX) 40 MG tablet Take 1 tablet (40 mg total) by mouth 2 (two) times daily. 180 tablet 3  . polyethylene glycol (MIRALAX / GLYCOLAX) 17 g packet Take 17 g by mouth 2 (two) times daily. 14 each 0  . potassium chloride (MICRO-K) 10 MEQ CR capsule Take 2 capsules (20 mEq total) by mouth 2 (two) times daily. *May take one additional tablet as needed for cramping 120 capsule 3  . prochlorperazine (COMPAZINE) 10 MG tablet Take 1 tablet (10 mg total) by mouth every 6 (six) hours as needed for nausea or vomiting. 30 tablet 0  . promethazine (PHENERGAN) 12.5 MG tablet Take 1 tablet (12.5 mg total) by mouth every 6 (six) hours as needed for nausea or vomiting. 45 tablet 1  . senna (SENOKOT) 8.6 MG TABS tablet Take 2 tablets (17.2 mg total) by mouth 2 (two) times daily with a meal. 120 each 0  . sucralfate (CARAFATE) 1 g tablet Take 1 tablet (1 g total) by mouth 4 (four) times daily -  with meals and at bedtime. 5 min before meals for  radiation induced esophagitis 120 tablet 2  . SURE COMFORT INS SYR 1CC/28G 28G X 1/2" 1 ML MISC USE TO INJECT INSULIN UP TO 4 TIMES DAILY. 150 each 5  . tiZANidine (ZANAFLEX) 4 MG tablet Take 1 tablet (4 mg total) by mouth every 8 (eight) hours as needed for muscle spasms. 30 tablet 0  . topiramate (TOPAMAX) 25 MG tablet Take 75 mg by mouth at bedtime.     Marland Kitchen umeclidinium-vilanterol (ANORO ELLIPTA) 62.5-25 MCG/INH AEPB Inhale 1 puff into the lungs daily. 30 each 0   No current facility-administered medications for this visit.     PHYSICAL EXAMINATION: ECOG PERFORMANCE STATUS: 3 - Symptomatic, >50% confined to bed  No vitals taken  today, Exam not performed today   LABORATORY DATA:  I have reviewed the data as listed CBC Latest Ref Rng & Units 01/06/2019 01/03/2019 12/30/2018  WBC 4.0 - 10.5 K/uL 4.3 2.4(L) 1.4(L)  Hemoglobin 12.0 - 15.0 g/dL 9.3(L) 8.7(L) 7.2(L)  Hematocrit 36.0 - 46.0 % 29.6(L) 27.3(L) 22.7(L)  Platelets 150 - 400 K/uL 202 182 111(L)     CMP Latest Ref Rng & Units 01/06/2019 01/03/2019 12/30/2018  Glucose 70 - 99 mg/dL 157(H) 86 163(H)  BUN 8 - 23 mg/dL 17 23 28(H)  Creatinine 0.44 - 1.00 mg/dL 1.62(H) 1.85(H) 2.34(H)  Sodium 135 - 145 mmol/L 140 138 136  Potassium 3.5 - 5.1 mmol/L 3.8 3.9 4.2  Chloride 98 - 111 mmol/L 104 105 103  CO2 22 - 32 mmol/L '26 25 22  ' Calcium 8.9 - 10.3 mg/dL 8.7(L) 8.6(L) 9.1  Total Protein 6.5 - 8.1 g/dL 6.4(L) 6.0(L) 6.5  Total Bilirubin 0.3 - 1.2 mg/dL 0.3 0.3 0.6  Alkaline Phos 38 - 126 U/L 109 94 107  AST 15 - 41 U/L 11(L) 14(L) 11(L)  ALT 0 - 44 U/L 6 <6 8      RADIOGRAPHIC STUDIES: I have personally reviewed the radiological images as listed and agreed with the findings in the report. No results found.   ASSESSMENT & PLAN:  Stephanie Combs is a 68 y.o. female with   1. Small Cell Lung cancer, LUL, cT3N2M0, stage III, limited stage  -Newly diagnosedin 5/2020during her work up for colon cancer -Ipreviouslydiscussed  her lung cancer would take priority in terms of treatment given theaggressivenessof small cell lung cancer, and very high risk of recurrence evenwith curative intentconcurrentchemoradiation.   -She completed last cycle chemo on 8/7, with significant dose reduction. She completed radiation therapy on 01/06/19. She overall tolerated both treatment moderately well  -Since completing chemoRT she has been gassy with mild intermittent abdominal pain which has reduced her eating. Her LE edema is overall stable. No other new concerns  -will repeat scan in 10-03/2019. -F/u with me in 2 weeks. Will recommend flu shot at that time.   2. 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  -Unfortunately she is not a candidate for colon cancer surgery due to very high risk ofsurgical mortality.She was seen by surgery during her recent hospitalization.  -I discussed the incurablenature of her colon cancer without surgery. -Willcontinueto monitor on current treatment for her SCLC. -if she recovers from chemoRT for [facility], will consider low intensity chemo such as Xeloda or Irinotecan (which works for [employer] also). Given normal MMR, she is not a candidate for immunotherapy. Given primary right side colon cancer, not much benefit from EGFR inhibitors even if KRAS/NRAS/BRAF wild type. -I discussed treatment with chemo would be palliative to control her disease. She would continue for as long as she can tolerate she progresses.   3. 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 -anemia slightly worse due to chemo -Continue monitoring -Last blood transfusion on 01/05/19  4. CAD, CHF, HTN, significant LE edema, CKD -OnLipitor, lasix, nitro -Continue to f/u with cardiologist. -Her  CHFand fluid overload has muchimproved during her recent hospitalization. -She has been on 18m Lasix in the morning.She responded well to iv lasixwhich I give during his chemo -I previously held her oral lasix and gave iv lasix447mailyduring chemowhich she responded very well. With completion of  chemo she takes alternating Lasix dose Lasix 2m in the AM and 215min the PM. Continue -LE is improving slowly, R LE is larger than L.   5.DM,hypothyroidism, morbid obesity -On insulin novolog and long acting levemir. OnLevothyroxine. -Willmonitor glucose closely during her chemo treatment -stable  6.Rheumatoidarthritis, chronic back pain  -On Percocet which she takes 4 times a day.  -She is also hasGabapentin.  -She use to receiveinjectionfor her back -Pain chronic, stable. She will continue to see her RA and pain specialist.   7. Depression and anxiety -She has been more depressed with recent diagnosis of 2 cancers -She is currently on Celebrex, will continue. -stable   8.Goal of care discussion, DNR/DNI -We previously discussed the incurable nature of her cancer, and the overall poor prognosis, especially ifshe does not have good response to chemotherapy or progress on chemo -The patient understands the goal of care is palliative. -I recommend DNR/DNI,she has agreed to DRI/DNR.She has written will and has established her daughter as her POA.  9.History ofCOVID-19 -She was incidentally found to be positive for COVID-19on 10/11/2018, no respiratory symptoms or evidence of pneumonia,she hasGIsymptoms which could be relate -Her testing on 11/04/18 was positive again. -no COVID symptoms   Plan: -she is clinically stable -Lab and f/u in 2 weeks    No problem-specific Assessment & Plan notes found for this encounter.   No orders of the defined types were placed in this encounter.  I discussed the assessment and treatment plan with the  patient. The patient was provided an opportunity to ask questions and all were answered. The patient agreed with the plan and demonstrated an understanding of the instructions.  The patient was advised to call back or seek an in-person evaluation if the symptoms worsen or if the condition fails to improve as anticipated.  I provided 15 minutes of non face-to-face telephone visit time during this encounter, and > 50% was spent counseling as documented under my assessment & plan.    YaTruitt MerleMD 01/20/2019   I, AmJoslyn Devonam acting as scribe for YaTruitt MerleMD.   I have reviewed the above documentation for accuracy and completeness, and I agree with the above.

## 2019-01-15 DIAGNOSIS — E1122 Type 2 diabetes mellitus with diabetic chronic kidney disease: Secondary | ICD-10-CM | POA: Diagnosis not present

## 2019-01-15 DIAGNOSIS — I130 Hypertensive heart and chronic kidney disease with heart failure and stage 1 through stage 4 chronic kidney disease, or unspecified chronic kidney disease: Secondary | ICD-10-CM | POA: Diagnosis not present

## 2019-01-15 DIAGNOSIS — I5033 Acute on chronic diastolic (congestive) heart failure: Secondary | ICD-10-CM | POA: Diagnosis not present

## 2019-01-15 DIAGNOSIS — I5022 Chronic systolic (congestive) heart failure: Secondary | ICD-10-CM | POA: Diagnosis not present

## 2019-01-15 DIAGNOSIS — L02211 Cutaneous abscess of abdominal wall: Secondary | ICD-10-CM | POA: Diagnosis not present

## 2019-01-20 ENCOUNTER — Inpatient Hospital Stay: Payer: Medicare Other | Attending: Hematology | Admitting: Hematology

## 2019-01-20 ENCOUNTER — Encounter: Payer: Self-pay | Admitting: Hematology

## 2019-01-20 ENCOUNTER — Other Ambulatory Visit: Payer: Self-pay

## 2019-01-20 ENCOUNTER — Emergency Department (HOSPITAL_COMMUNITY)
Admission: EM | Admit: 2019-01-20 | Discharge: 2019-01-21 | Disposition: A | Discharge status: Home / Self Care | Payer: Medicare Other | Attending: Emergency Medicine | Admitting: Emergency Medicine

## 2019-01-20 ENCOUNTER — Encounter (HOSPITAL_COMMUNITY): Payer: Self-pay

## 2019-01-20 DIAGNOSIS — Z79899 Other long term (current) drug therapy: Secondary | ICD-10-CM | POA: Diagnosis not present

## 2019-01-20 DIAGNOSIS — Z9221 Personal history of antineoplastic chemotherapy: Secondary | ICD-10-CM | POA: Insufficient documentation

## 2019-01-20 DIAGNOSIS — Z9981 Dependence on supplemental oxygen: Secondary | ICD-10-CM | POA: Insufficient documentation

## 2019-01-20 DIAGNOSIS — Z7982 Long term (current) use of aspirin: Secondary | ICD-10-CM | POA: Insufficient documentation

## 2019-01-20 DIAGNOSIS — R1084 Generalized abdominal pain: Secondary | ICD-10-CM | POA: Diagnosis not present

## 2019-01-20 DIAGNOSIS — Z794 Long term (current) use of insulin: Secondary | ICD-10-CM | POA: Diagnosis not present

## 2019-01-20 DIAGNOSIS — E039 Hypothyroidism, unspecified: Secondary | ICD-10-CM | POA: Insufficient documentation

## 2019-01-20 DIAGNOSIS — C3412 Malignant neoplasm of upper lobe, left bronchus or lung: Secondary | ICD-10-CM | POA: Insufficient documentation

## 2019-01-20 DIAGNOSIS — I959 Hypotension, unspecified: Secondary | ICD-10-CM | POA: Diagnosis not present

## 2019-01-20 DIAGNOSIS — I25118 Atherosclerotic heart disease of native coronary artery with other forms of angina pectoris: Secondary | ICD-10-CM | POA: Diagnosis not present

## 2019-01-20 DIAGNOSIS — J449 Chronic obstructive pulmonary disease, unspecified: Secondary | ICD-10-CM | POA: Diagnosis not present

## 2019-01-20 DIAGNOSIS — Y842 Radiological procedure and radiotherapy as the cause of abnormal reaction of the patient, or of later complication, without mention of misadventure at the time of the procedure: Secondary | ICD-10-CM | POA: Insufficient documentation

## 2019-01-20 DIAGNOSIS — E1122 Type 2 diabetes mellitus with diabetic chronic kidney disease: Secondary | ICD-10-CM | POA: Diagnosis not present

## 2019-01-20 DIAGNOSIS — R609 Edema, unspecified: Secondary | ICD-10-CM

## 2019-01-20 DIAGNOSIS — I5032 Chronic diastolic (congestive) heart failure: Secondary | ICD-10-CM | POA: Diagnosis not present

## 2019-01-20 DIAGNOSIS — Z87891 Personal history of nicotine dependence: Secondary | ICD-10-CM | POA: Diagnosis not present

## 2019-01-20 DIAGNOSIS — M549 Dorsalgia, unspecified: Secondary | ICD-10-CM | POA: Insufficient documentation

## 2019-01-20 DIAGNOSIS — F418 Other specified anxiety disorders: Secondary | ICD-10-CM | POA: Insufficient documentation

## 2019-01-20 DIAGNOSIS — I130 Hypertensive heart and chronic kidney disease with heart failure and stage 1 through stage 4 chronic kidney disease, or unspecified chronic kidney disease: Secondary | ICD-10-CM | POA: Insufficient documentation

## 2019-01-20 DIAGNOSIS — Z923 Personal history of irradiation: Secondary | ICD-10-CM | POA: Diagnosis not present

## 2019-01-20 DIAGNOSIS — R1013 Epigastric pain: Secondary | ICD-10-CM | POA: Insufficient documentation

## 2019-01-20 DIAGNOSIS — T451X5A Adverse effect of antineoplastic and immunosuppressive drugs, initial encounter: Secondary | ICD-10-CM | POA: Insufficient documentation

## 2019-01-20 DIAGNOSIS — N183 Chronic kidney disease, stage 3 (moderate): Secondary | ICD-10-CM | POA: Insufficient documentation

## 2019-01-20 DIAGNOSIS — R600 Localized edema: Secondary | ICD-10-CM | POA: Insufficient documentation

## 2019-01-20 DIAGNOSIS — I5030 Unspecified diastolic (congestive) heart failure: Secondary | ICD-10-CM | POA: Insufficient documentation

## 2019-01-20 DIAGNOSIS — R110 Nausea: Secondary | ICD-10-CM

## 2019-01-20 DIAGNOSIS — R0902 Hypoxemia: Secondary | ICD-10-CM | POA: Diagnosis not present

## 2019-01-20 DIAGNOSIS — E500 Vitamin A deficiency with conjunctival xerosis: Secondary | ICD-10-CM | POA: Diagnosis not present

## 2019-01-20 DIAGNOSIS — C183 Malignant neoplasm of hepatic flexure: Secondary | ICD-10-CM | POA: Diagnosis not present

## 2019-01-20 DIAGNOSIS — G8929 Other chronic pain: Secondary | ICD-10-CM | POA: Insufficient documentation

## 2019-01-20 DIAGNOSIS — D6481 Anemia due to antineoplastic chemotherapy: Secondary | ICD-10-CM | POA: Insufficient documentation

## 2019-01-20 DIAGNOSIS — R52 Pain, unspecified: Secondary | ICD-10-CM

## 2019-01-20 DIAGNOSIS — Z955 Presence of coronary angioplasty implant and graft: Secondary | ICD-10-CM | POA: Diagnosis not present

## 2019-01-20 DIAGNOSIS — N189 Chronic kidney disease, unspecified: Secondary | ICD-10-CM | POA: Insufficient documentation

## 2019-01-20 DIAGNOSIS — R42 Dizziness and giddiness: Secondary | ICD-10-CM | POA: Diagnosis not present

## 2019-01-20 DIAGNOSIS — T85848A Pain due to other internal prosthetic devices, implants and grafts, initial encounter: Secondary | ICD-10-CM | POA: Diagnosis not present

## 2019-01-20 DIAGNOSIS — M069 Rheumatoid arthritis, unspecified: Secondary | ICD-10-CM | POA: Insufficient documentation

## 2019-01-20 DIAGNOSIS — Z23 Encounter for immunization: Secondary | ICD-10-CM | POA: Insufficient documentation

## 2019-01-20 DIAGNOSIS — I2510 Atherosclerotic heart disease of native coronary artery without angina pectoris: Secondary | ICD-10-CM | POA: Insufficient documentation

## 2019-01-20 DIAGNOSIS — D500 Iron deficiency anemia secondary to blood loss (chronic): Secondary | ICD-10-CM | POA: Insufficient documentation

## 2019-01-20 DIAGNOSIS — K5900 Constipation, unspecified: Secondary | ICD-10-CM

## 2019-01-20 LAB — CBC
HCT: 28.4 % — ABNORMAL LOW (ref 36.0–46.0)
Hemoglobin: 8.9 g/dL — ABNORMAL LOW (ref 12.0–15.0)
MCH: 32.6 pg (ref 26.0–34.0)
MCHC: 31.3 g/dL (ref 30.0–36.0)
MCV: 104 fL — ABNORMAL HIGH (ref 80.0–100.0)
Platelets: 206 10*3/uL (ref 150–400)
RBC: 2.73 MIL/uL — ABNORMAL LOW (ref 3.87–5.11)
RDW: 18.3 % — ABNORMAL HIGH (ref 11.5–15.5)
WBC: 7.2 10*3/uL (ref 4.0–10.5)
nRBC: 0 % (ref 0.0–0.2)

## 2019-01-20 LAB — COMPREHENSIVE METABOLIC PANEL
ALT: 9 U/L (ref 0–44)
AST: 14 U/L — ABNORMAL LOW (ref 15–41)
Albumin: 3.3 g/dL — ABNORMAL LOW (ref 3.5–5.0)
Alkaline Phosphatase: 73 U/L (ref 38–126)
Anion gap: 10 (ref 5–15)
BUN: 28 mg/dL — ABNORMAL HIGH (ref 8–23)
CO2: 27 mmol/L (ref 22–32)
Calcium: 9 mg/dL (ref 8.9–10.3)
Chloride: 100 mmol/L (ref 98–111)
Creatinine, Ser: 1.94 mg/dL — ABNORMAL HIGH (ref 0.44–1.00)
GFR calc Af Amer: 30 mL/min — ABNORMAL LOW (ref >60)
GFR calc non Af Amer: 26 mL/min — ABNORMAL LOW (ref >60)
Glucose, Bld: 47 mg/dL — ABNORMAL LOW (ref 70–99)
Potassium: 3.7 mmol/L (ref 3.5–5.1)
Sodium: 137 mmol/L (ref 135–145)
Total Bilirubin: 0.3 mg/dL (ref 0.3–1.2)
Total Protein: 6.2 g/dL — ABNORMAL LOW (ref 6.5–8.1)

## 2019-01-20 LAB — LIPASE, BLOOD: Lipase: 17 U/L (ref 11–51)

## 2019-01-20 MED ORDER — LIDOCAINE VISCOUS HCL 2 % MT SOLN
15.0000 mL | Freq: Once | OROMUCOSAL | Status: AC
  Administered 2019-01-21: 15 mL via ORAL
  Filled 2019-01-20: qty 15

## 2019-01-20 MED ORDER — FUROSEMIDE 10 MG/ML IJ SOLN: 20.0000 mg | Freq: Once | INTRAMUSCULAR | Status: DC

## 2019-01-20 MED ORDER — ALUM & MAG HYDROXIDE-SIMETH 200-200-20 MG/5ML PO SUSP
30.0000 mL | Freq: Once | ORAL | Status: AC
  Administered 2019-01-21: 30 mL via ORAL
  Filled 2019-01-20: qty 30

## 2019-01-20 NOTE — ED Triage Notes (Addendum)
Pt BIB [facility] from home. Pt is a CA pt, being treated for lung and colon cancer. Pt is c/o nausea and overall body aches from chemo/radiation. Pt last chemo was 8/20 and radiation was 8/27. Pt is also c/o abd pain, light headedness and dizziness.  Pt was hypotensive with EMS 90/60 was 108/78 after 1L of NS.    Pt tested positive for COVID in May, wears 2L [place] at home.

## 2019-01-20 NOTE — ED Provider Notes (Signed)
[facility] DEPT Provider Note  CSN: [id number] Arrival date & time: 01/20/19 2107  Chief Complaint(s) Nausea, Abdominal Pain, and Dizziness  HPI Stephanie Combs is a 68 y.o. female    Abdominal Pain Pain location:  Epigastric Pain quality: burning   Pain radiates to:  Does not radiate Pain severity:  Moderate Onset quality:  Gradual Timing:  Constant Progression:  Waxing and waning Chronicity:  Recurrent Context comment:  H/o colon cancer on chemo and radiation Relieved by:  Nothing Worsened by:  Eating Associated symptoms: anorexia, constipation, fatigue and nausea   Associated symptoms: no diarrhea, no dysuria, no fever, no shortness of breath and no vomiting   Dizziness Associated symptoms: nausea   Associated symptoms: no diarrhea, no shortness of breath and no vomiting      Patient also complaining of lower extremity edema. On lasix. Gets IV lasix on chemo days.  Past Medical History Past Medical History:  Diagnosis Date   Anxiety    CAD (coronary artery disease)    a. s/p DES to LAD and angioplasty to D1 in 05/2017   CHF (congestive heart failure) (HCC)    Diastolic   Chronic back pain    Chronic pain    COPD (chronic obstructive pulmonary disease) ([place])    Degenerative disk disease    Diabetes mellitus without complication ([place])    History of medication noncompliance 11/2017   "the patient frequently self-adjusts medications without notifying her physicians"   Hypertension    Hypothyroidism    On home O2    2.5 L N/C prn   Pedal edema    Patient Active Problem List   Diagnosis Date Noted   Acute on chronic diastolic heart failure ([place]) 10/11/2018   Assistance needed with transportation 09/22/2018   Primary cancer of hepatic flexure of colon ([place])    Small cell lung cancer, left upper lobe ([place])    Colonic mass 09/10/2018   Colon polyps 09/10/2018   Cellulitis of leg, left 09/10/2018   Pain of  lower extremity    Non-intractable vomiting    Abdominal pain, epigastric    Heme positive stool    Acute on chronic diastolic CHF (congestive heart failure) ([place]) 09/06/2018   Symptomatic anemia 08/31/2018   Anemia 08/30/2018   Chronic pain 07/05/2018   On home O2 07/05/2018   Opioid dependence ([place]) 07/05/2018   CAD (coronary artery disease) 05/14/2018   AKI (acute kidney injury) ([place]) 04/05/2018   Cellulitis 01/10/2018   Cellulitis of right lower extremity 01/07/2018   Altered mental status 01/06/2018   Type 2 diabetes mellitus with hypoglycemia without coma ([place]) 11/24/2017   Anxiety 11/24/2017   Chronic respiratory failure with hypoxia ([place]) 11/24/2017   Syncope 10/24/2017   Bradycardia    Syncope due to orthostatic hypotension 09/04/2017   Obesity, Class III, BMI 40-49.9 (morbid obesity) ([place]) 09/04/2017   Abnormal nuclear stress test 05/19/2017   Atypical angina ([place]) 05/19/2017   Edema 05/15/2017   Skin ulcer ([place]) 01/30/2017   Tinea cruris 10/10/2016   Vitamin D deficiency 08/19/2016   Personal history of noncompliance with medical treatment, presenting hazards to health 06/17/2016   CAP (community acquired pneumonia) 07/25/2015   Pressure ulcer 05/02/2015   Hyponatremia 05/01/2015   Acute-on-chronic kidney injury ([place]) 05/01/2015   Uncontrolled type 2 diabetes mellitus with stage 3 chronic kidney disease ([place]) 05/01/2015   Cellulitis of foot, right 03/24/2015   Peripheral edema 12/04/2014   Acute renal failure superimposed on stage 3 chronic  kidney disease ([place]) 11/24/2014   Bilateral lower extremity edema    Diabetic ulcer of right great toe (HCC)    Foot ulcer due to secondary DM ([place]) 11/07/2014   Diabetic ulcer of right foot ([place]) 11/07/2014   Edema of lower extremity 05/27/2013   CKD (chronic kidney disease), stage III ([place]) 04/14/2013   Anemia in CKD (chronic kidney disease) 04/14/2013   Chest pain 04/14/2013    Dyspnea 04/14/2013   Chronic diastolic CHF (congestive heart failure) (HCC)    Hypothyroidism    COPD (chronic obstructive pulmonary disease) ([place]) 08/07/2012   Morbid obesity ([place]) 08/07/2012   DM type 2 (diabetes mellitus, type 2) ([place]) 08/07/2012   HTN (hypertension), benign 08/07/2012   Home Medication(s) Prior to Admission medications   Medication Sig Start Date End Date Taking? Authorizing Provider  albuterol (PROAIR HFA) 108 (90 Base) MCG/ACT inhaler Inhale 2 puffs into the lungs every 4 (four) hours as needed for wheezing or shortness of breath. For shortness of breath 09/22/18  Yes Eugenie Filler, MD  albuterol (PROVENTIL) (2.5 MG/3ML) 0.083% nebulizer solution Take 2.5 mg by nebulization 2 (two) times daily as needed for wheezing or shortness of breath.    Yes [provider]  allopurinol (ZYLOPRIM) 300 MG tablet Take 300 mg by mouth daily.   Yes [provider]  ALPRAZolam Duanne Moron) 1 MG tablet Take 1 mg by mouth 3 (three) times daily as needed for anxiety or sleep.  01/05/17  Yes [provider]  aspirin EC 81 MG tablet Take 81 mg by mouth daily.   Yes [provider]  atorvastatin (LIPITOR) 80 MG tablet Take 1 tablet (80 mg total) by mouth daily at 6 PM. 05/26/17  Yes [name], MD  cholecalciferol (VITAMIN D3) 25 MCG (1000 UT) tablet Take 5,000 Units by mouth daily.   Yes [provider]  diphenoxylate-atropine (LOMOTIL) 2.5-0.025 MG tablet Take 1 tablet by mouth 4 (four) times daily as needed for diarrhea or loose stools.  01/03/19  Yes [provider]  furosemide (LASIX) 20 MG tablet Take 20 mg by mouth every evening.   Yes [provider]  furosemide (LASIX) 40 MG tablet Take 60 mg (1 1/2 tabs) in the morning and 40 mg (1 tab) in the afternoon Patient taking differently: Take 40 mg by mouth daily.  10/30/18  Yes Danford, Suann Larry, MD  gabapentin (NEURONTIN) 600 MG tablet Take 600 mg by mouth 3 (three) times  daily.    Yes [provider]  hydrocortisone (ANUSOL-HC) 2.5 % rectal cream Place 1 application rectally 4 (four) times daily as needed for hemorrhoids.  01/03/19  Yes [provider]  Hydrocortisone (GERHARDT'S BUTT CREAM) CREA Apply 1 application topically 4 (four) times daily as needed for irritation. 12/16/18  Yes Truitt Merle, MD  insulin detemir (LEVEMIR) 100 UNIT/ML injection Inject 0.4 mLs (40 Units total) into the skin at bedtime. 11/26/17  Yes Tat, Shanon Brow, MD  insulin lispro (HUMALOG) 100 UNIT/ML injection Inject 0-20 Units into the skin 3 (three) times daily with meals.  01/03/19  Yes [provider]  levothyroxine (SYNTHROID, LEVOTHROID) 200 MCG tablet Take 1 tablet by mouth daily before breakfast.  06/24/18  Yes [provider]  nitroGLYCERIN (NITROSTAT) 0.4 MG SL tablet Place 0.4 mg under the tongue every 5 (five) minutes as needed for chest pain.  03/26/18  Yes [provider]  nystatin (MYCOSTATIN/NYSTOP) powder Apply topically 2 (two) times daily. 12/16/18  Yes Truitt Merle, MD  Omega-3  Fatty Acids (FISH OIL PO) Take 1 capsule by mouth daily.    Yes [provider]  ondansetron (ZOFRAN-ODT) 8 MG disintegrating tablet Take 8 mg by mouth every 8 (eight) hours as needed for nausea or vomiting.  01/03/19  Yes [provider]  oxyCODONE-acetaminophen (PERCOCET) 10-325 MG tablet Take 1 tablet by mouth every 6 (six) hours as needed for pain.   Yes [provider]  OXYGEN Inhale 2 L into the lungs continuous.   Yes [provider]  pantoprazole (PROTONIX) 40 MG tablet Take 1 tablet (40 mg total) by mouth 2 (two) times daily. 10/07/18  Yes Truitt Merle, MD  polyethylene glycol (MIRALAX / GLYCOLAX) 17 g packet Take 17 g by mouth 2 (two) times daily. 09/21/18  Yes Arrien, Jimmy Picket, MD  potassium chloride SA (K-DUR) 20 MEQ tablet Take 20 mEq by mouth 2 (two) times daily.   Yes [provider]  prochlorperazine  (COMPAZINE) 10 MG tablet Take 1 tablet (10 mg total) by mouth every 6 (six) hours as needed for nausea or vomiting. 11/24/18  Yes Truitt Merle, MD  promethazine (PHENERGAN) 12.5 MG tablet Take 1 tablet (12.5 mg total) by mouth every 6 (six) hours as needed for nausea or vomiting. 11/26/18  Yes Tanner, Lyndon Code., PA-C  senna (SENOKOT) 8.6 MG TABS tablet Take 2 tablets (17.2 mg total) by mouth 2 (two) times daily with a meal. 09/21/18  Yes Arrien, Jimmy Picket, MD  silver sulfADIAZINE (SILVADENE) 1 % cream Apply 1 application topically 2 (two) times daily.  01/03/19  Yes [provider]  sucralfate (CARAFATE) 1 g tablet Take 1 tablet (1 g total) by mouth 4 (four) times daily -  with meals and at bedtime. 5 min before meals for radiation induced esophagitis 12/03/18  Yes Tyler Pita, MD  tiZANidine (ZANAFLEX) 4 MG tablet Take 1 tablet (4 mg total) by mouth every 8 (eight) hours as needed for muscle spasms. 07/08/18  Yes Kathie Dike, MD  topiramate (TOPAMAX) 25 MG tablet Take 75 mg by mouth at bedtime.  03/18/18  Yes [provider]  umeclidinium-vilanterol (ANORO ELLIPTA) 62.5-25 MCG/INH AEPB Inhale 1 puff into the lungs daily. 05/26/17  Yes [name], MD  allopurinol (ZYLOPRIM) 300 MG tablet Take 1 tablet (300 mg total) by mouth daily for 15 days. 12/15/18 12/30/18  Truitt Merle, MD  insulin aspart (NOVOLOG) 100 UNIT/ML injection Inject 1-18 Units into the skin 3 (three) times daily with meals. Patient not taking: Reported on 01/20/2019 04/09/18   Murlean Iba, MD  ondansetron (ZOFRAN) 8 MG tablet Take 1 tablet (8 mg total) by mouth every 8 (eight) hours as needed for nausea or vomiting. 12/15/18   Truitt Merle, MD  potassium chloride (MICRO-K) 10 MEQ CR capsule Take 2 capsules (20 mEq total) by mouth 2 (two) times daily. *May take one additional tablet as needed for cramping Patient not taking: Reported on 01/20/2019 04/23/18   Herminio Commons, MD  SURE COMFORT INS SYR 1CC/28G 28G X 1/2"  1 ML MISC USE TO INJECT INSULIN UP TO 4 TIMES DAILY. 03/06/17   Cassandria Anger, MD  Past Surgical History Past Surgical History:  Procedure Laterality Date   BIOPSY  09/09/2018   Procedure: BIOPSY;  Surgeon: Daneil Dolin, MD;  [facility];  Service: Endoscopy;;  gastric esophageal hepatic flexure colon   COLONOSCOPY WITH PROPOFOL N/A 09/09/2018   Procedure: COLONOSCOPY WITH PROPOFOL;  Surgeon: Daneil Dolin, MD;  Location[facility];  Service: Endoscopy;  Laterality: N/A;   CORONARY STENT INTERVENTION N/A 05/22/2017   Procedure: CORONARY STENT INTERVENTION;  Surgeon: Martinique, Peter M, MD;  Location: [facility];  Service: Cardiovascular;  Laterality: N/A;   ESOPHAGOGASTRODUODENOSCOPY (EGD) WITH PROPOFOL N/A 09/09/2018   Procedure: ESOPHAGOGASTRODUODENOSCOPY (EGD) WITH PROPOFOL;  Surgeon: Daneil Dolin, MD;  [facility];  Service: Endoscopy;  Laterality: N/A;   HERNIA REPAIR     POLYPECTOMY  09/09/2018   Procedure: POLYPECTOMY;  Surgeon: Daneil Dolin, MD;  [facility];  Service: Endoscopy;;  colon   RIGHT/LEFT HEART CATH AND CORONARY ANGIOGRAPHY N/A 05/19/2017   Procedure: RIGHT/LEFT HEART CATH AND CORONARY ANGIOGRAPHY;  Surgeon: Leonie Man, MD;  Location: [facility];  Service: Cardiovascular;  Laterality: N/A;   TUBAL LIGATION     VIDEO BRONCHOSCOPY WITH ENDOBRONCHIAL ULTRASOUND Left 09/14/2018   Procedure: VIDEO BRONCHOSCOPY WITH ENDOBRONCHIAL ULTRASOUND with biopsies;  Surgeon: Margaretha Seeds, MD;  Location: [facility];  Service: Thoracic;  Laterality: Left;   Family History Family History  Problem Relation Age of Onset   Stroke Mother        deceased 40    Heart attack Father        deceased 37    Diabetes Brother    Cerebral palsy Brother    Pneumonia Brother    Diabetes  Other    Heart attack Other    Colon cancer Neg Hx     Social History Social History   Tobacco Use   Smoking status: Former Smoker    Packs/day: 1.50    Years: 45.00    Pack years: 67.50    Types: Cigarettes    Quit date: 11/24/2014    Years since quitting: 4.1   Smokeless tobacco: Never Used  Substance Use Topics   Alcohol use: No    Alcohol/week: 0.0 standard drinks   Drug use: No   Allergies Dilaudid [hydromorphone hcl], Midodrine hcl, Actifed cold-allergy [chlorpheniramine-phenylephrine], Doxycycline, Other, Penicillins, Reglan [metoclopramide], Valium [diazepam], and Vistaril [hydroxyzine hcl]  Review of Systems Review of Systems  Constitutional: Positive for fatigue. Negative for fever.  Respiratory: Negative for shortness of breath.   Gastrointestinal: Positive for abdominal pain, anorexia, constipation and nausea. Negative for diarrhea and vomiting.  Genitourinary: Negative for dysuria.  Neurological: Positive for dizziness.   All other systems are reviewed and are negative for acute change except as noted in the HPI  Physical Exam Vital Signs  I have reviewed the triage vital signs BP (!) 127/49    Pulse 72    Temp 98 F (36.7 C) (Oral)    Resp 16    SpO2 98%   Physical Exam Vitals signs reviewed.  Constitutional:      General: She is not in acute distress.    Appearance: She is well-developed. She is morbidly obese. She is ill-appearing (chronically ill-appearing). She is not diaphoretic.  HENT:     Head: Normocephalic and atraumatic.     Nose: Nose normal.  Eyes:     General: No scleral icterus.       Right eye:  No discharge.        Left eye: No discharge.     Conjunctiva/sclera: Conjunctivae normal.     Pupils: Pupils are equal, round, and reactive to light.  Neck:     Musculoskeletal: Normal range of motion and neck supple.  Cardiovascular:     Rate and Rhythm: Normal rate and regular rhythm.     Heart sounds: No murmur. No friction rub. No  gallop.   Pulmonary:     Effort: Pulmonary effort is normal. No respiratory distress.     Breath sounds: Normal breath sounds. No stridor. No rales.  Abdominal:     General: There is no distension.     Palpations: Abdomen is soft.     Tenderness: There is no abdominal tenderness.  Musculoskeletal:        General: No tenderness.     Comments: Nonpitting BLE edema  Skin:    General: Skin is warm and dry.     Findings: No erythema or rash.  Neurological:     Mental Status: She is alert and oriented to person, place, and time.     ED Results and Treatments Labs (all labs ordered are listed, but only abnormal results are displayed) Labs Reviewed  COMPREHENSIVE METABOLIC PANEL - Abnormal; Notable for the following components:      Result Value   Glucose, Bld 47 (*)    BUN 28 (*)    Creatinine, Ser 1.94 (*)    Total Protein 6.2 (*)    Albumin 3.3 (*)    AST 14 (*)    GFR calc non Af Amer 26 (*)    GFR calc Af Amer 30 (*)    All other components within normal limits  CBC - Abnormal; Notable for the following components:   RBC 2.73 (*)    Hemoglobin 8.9 (*)    HCT 28.4 (*)    MCV 104.0 (*)    RDW 18.3 (*)    All other components within normal limits  URINALYSIS, ROUTINE W REFLEX MICROSCOPIC - Abnormal; Notable for the following components:   Specific Gravity, Urine 1.004 (*)    Hgb urine dipstick MODERATE (*)    All other components within normal limits  LIPASE, BLOOD                                                                                                                         EKG  EKG Interpretation  Date/Time:    Ventricular Rate:    PR Interval:    QRS Duration:   QT Interval:    QTC Calculation:   R Axis:     Text Interpretation:        Radiology No results found.  Pertinent labs & imaging results that were available during my care of the patient were reviewed by me and considered in my medical decision making (see chart for details).  Medications  Ordered in ED Medications  alum & mag hydroxide-simeth (MAALOX/MYLANTA) 200-200-20 MG/5ML suspension  30 mL (30 mLs Oral Given 01/21/19 0012)    And  lidocaine (XYLOCAINE) 2 % viscous mouth solution 15 mL (15 mLs Oral Given 01/21/19 0012)  furosemide (LASIX) injection 20 mg (20 mg Intravenous Given 01/21/19 0218)  oxyCODONE-acetaminophen (PERCOCET/ROXICET) 5-325 MG per tablet 1 tablet (1 tablet Oral Given 01/21/19 0218)  ALPRAZolam Duanne Moron) tablet 1 mg (1 mg Oral Given 01/21/19 0217)                                                                                                                                    Procedures Procedures  (including critical care time)  Medical Decision Making / ED Course I have reviewed the nursing notes for this encounter and the patient's prior records (if available in EHR or on provided paperwork).   Stephanie Combs was evaluated in Emergency Department on 01/21/2019 for the symptoms described in the history of present illness. She was evaluated in the context of the global COVID-19 pandemic, which necessitated consideration that the patient might be at risk for infection with the SARS-CoV-2 virus that causes COVID-19. Institutional protocols and algorithms that pertain to the evaluation of patients at risk for COVID-19 are in a state of rapid change based on information released by regulatory bodies including the CDC and federal and state organizations. These policies and algorithms were followed during the patient's care in the ED.  Patient is here for radiation related abd pain affecting oral intake. AFVSS abd benign.  Labs reassuring. W/o evidence of bili obstruction or pancreatitis.  Close to baseline renal function.  UA without evidence of infection.  Patient provided with GI cocktail.  She is able to tolerate oral intake.  Requesting her oral pain medicine and Xanax.  Additionally patient was given 20 mg of IV Lasix for edema. Diuresed well. Patient had a  large bowel movement emergency department.  Low suspicion for serious intra-abdominal premature/infectious process requiring imaging at this time.  The patient appears reasonably screened and/or stabilized for discharge and I doubt any other medical condition or other [facility] requiring further screening, evaluation, or treatment in the ED at this time prior to discharge.  The patient is safe for discharge with strict return precautions.      Final Clinical Impression(s) / ED Diagnoses Final diagnoses:  Pain after radiation therapy  Epigastric pain  Peripheral edema    The patient appears reasonably screened and/or stabilized for discharge and I doubt any other medical condition or other [facility] requiring further screening, evaluation, or treatment in the ED at this time prior to discharge.  Disposition: Discharge  Condition: Good  I have discussed the results, Dx and Tx plan with the patient who expressed understanding and agree(s) with the plan. Discharge instructions discussed at great length. The patient was given strict return precautions who verbalized understanding of the instructions. No further questions at time of discharge.    ED Discharge Orders  None        Follow Up: [name], MD [address] [place] [address] [phone]     [place], [place] [address] [phone]       This chart was dictated using voice recognition software.  Despite best efforts to proofread,  errors can occur which can change the documentation meaning.   Fatima Blank, MD 01/21/19 [phone]

## 2019-01-21 ENCOUNTER — Telehealth: Payer: Self-pay | Admitting: Hematology

## 2019-01-21 DIAGNOSIS — Z743 Need for continuous supervision: Secondary | ICD-10-CM | POA: Diagnosis not present

## 2019-01-21 DIAGNOSIS — R279 Unspecified lack of coordination: Secondary | ICD-10-CM | POA: Diagnosis not present

## 2019-01-21 LAB — URINALYSIS, ROUTINE W REFLEX MICROSCOPIC
Bacteria, UA: NONE SEEN
Bilirubin Urine: NEGATIVE
Glucose, UA: NEGATIVE mg/dL
Ketones, ur: NEGATIVE mg/dL
Leukocytes,Ua: NEGATIVE
Nitrite: NEGATIVE
Protein, ur: NEGATIVE mg/dL
Specific Gravity, Urine: 1.004 — ABNORMAL LOW (ref 1.005–1.030)
pH: 5 (ref 5.0–8.0)

## 2019-01-21 MED ORDER — OXYCODONE-ACETAMINOPHEN 5-325 MG PO TABS
1.0000 | ORAL_TABLET | Freq: Once | ORAL | Status: AC
  Administered 2019-01-21: 1 via ORAL
  Filled 2019-01-21: qty 1

## 2019-01-21 MED ORDER — ALPRAZOLAM 0.5 MG PO TABS
1.0000 mg | ORAL_TABLET | Freq: Once | ORAL | Status: AC
  Administered 2019-01-21: 1 mg via ORAL
  Filled 2019-01-21: qty 2

## 2019-01-21 MED ORDER — FUROSEMIDE 10 MG/ML IJ SOLN
20.0000 mg | Freq: Once | INTRAMUSCULAR | Status: AC
  Administered 2019-01-21: 20 mg via INTRAVENOUS
  Filled 2019-01-21: qty 4

## 2019-01-21 NOTE — ED Notes (Signed)
Pt assisted to [facility]

## 2019-01-21 NOTE — ED Notes (Signed)
Pt assisted to [facility].  Pt requesting Xanax and Percocet from home med list. EDP made aware.

## 2019-01-21 NOTE — Telephone Encounter (Signed)
No los per 9/10.

## 2019-01-21 NOTE — ED Notes (Signed)
Pt provided with diet gingerale and decaf coffee.

## 2019-01-21 NOTE — Discharge Instructions (Signed)

## 2019-01-22 ENCOUNTER — Encounter (HOSPITAL_COMMUNITY): Payer: Self-pay | Admitting: Emergency Medicine

## 2019-01-22 ENCOUNTER — Emergency Department (HOSPITAL_COMMUNITY): Payer: Medicare Other

## 2019-01-22 ENCOUNTER — Other Ambulatory Visit: Payer: Self-pay

## 2019-01-22 ENCOUNTER — Emergency Department (HOSPITAL_COMMUNITY)
Admission: EM | Admit: 2019-01-22 | Discharge: 2019-01-22 | Disposition: A | Discharge status: Home / Self Care | Payer: Medicare Other | Attending: Emergency Medicine | Admitting: Emergency Medicine

## 2019-01-22 DIAGNOSIS — R531 Weakness: Secondary | ICD-10-CM | POA: Diagnosis not present

## 2019-01-22 DIAGNOSIS — I259 Chronic ischemic heart disease, unspecified: Secondary | ICD-10-CM | POA: Diagnosis not present

## 2019-01-22 DIAGNOSIS — Z87891 Personal history of nicotine dependence: Secondary | ICD-10-CM | POA: Diagnosis not present

## 2019-01-22 DIAGNOSIS — E039 Hypothyroidism, unspecified: Secondary | ICD-10-CM | POA: Insufficient documentation

## 2019-01-22 DIAGNOSIS — I130 Hypertensive heart and chronic kidney disease with heart failure and stage 1 through stage 4 chronic kidney disease, or unspecified chronic kidney disease: Secondary | ICD-10-CM | POA: Insufficient documentation

## 2019-01-22 DIAGNOSIS — I5022 Chronic systolic (congestive) heart failure: Secondary | ICD-10-CM | POA: Diagnosis not present

## 2019-01-22 DIAGNOSIS — E1122 Type 2 diabetes mellitus with diabetic chronic kidney disease: Secondary | ICD-10-CM | POA: Insufficient documentation

## 2019-01-22 DIAGNOSIS — I959 Hypotension, unspecified: Secondary | ICD-10-CM

## 2019-01-22 DIAGNOSIS — N183 Chronic kidney disease, stage 3 (moderate): Secondary | ICD-10-CM | POA: Diagnosis not present

## 2019-01-22 DIAGNOSIS — Z794 Long term (current) use of insulin: Secondary | ICD-10-CM | POA: Insufficient documentation

## 2019-01-22 DIAGNOSIS — L02211 Cutaneous abscess of abdominal wall: Secondary | ICD-10-CM | POA: Diagnosis not present

## 2019-01-22 DIAGNOSIS — Z7982 Long term (current) use of aspirin: Secondary | ICD-10-CM | POA: Diagnosis not present

## 2019-01-22 DIAGNOSIS — I5033 Acute on chronic diastolic (congestive) heart failure: Secondary | ICD-10-CM | POA: Diagnosis not present

## 2019-01-22 DIAGNOSIS — Z79899 Other long term (current) drug therapy: Secondary | ICD-10-CM | POA: Insufficient documentation

## 2019-01-22 LAB — CBC WITH DIFFERENTIAL/PLATELET
Abs Immature Granulocytes: 0.03 10*3/uL (ref 0.00–0.07)
Basophils Absolute: 0 10*3/uL (ref 0.0–0.1)
Basophils Relative: 1 %
Eosinophils Absolute: 0.1 10*3/uL (ref 0.0–0.5)
Eosinophils Relative: 2 %
HCT: 28.7 % — ABNORMAL LOW (ref 36.0–46.0)
Hemoglobin: 8.8 g/dL — ABNORMAL LOW (ref 12.0–15.0)
Immature Granulocytes: 1 %
Lymphocytes Relative: 18 %
Lymphs Abs: 0.9 10*3/uL (ref 0.7–4.0)
MCH: 32.5 pg (ref 26.0–34.0)
MCHC: 30.7 g/dL (ref 30.0–36.0)
MCV: 105.9 fL — ABNORMAL HIGH (ref 80.0–100.0)
Monocytes Absolute: 0.4 10*3/uL (ref 0.1–1.0)
Monocytes Relative: 8 %
Neutro Abs: 3.7 10*3/uL (ref 1.7–7.7)
Neutrophils Relative %: 70 %
Platelets: 224 10*3/uL (ref 150–400)
RBC: 2.71 MIL/uL — ABNORMAL LOW (ref 3.87–5.11)
RDW: 18.1 % — ABNORMAL HIGH (ref 11.5–15.5)
WBC: 5.2 10*3/uL (ref 4.0–10.5)
nRBC: 0 % (ref 0.0–0.2)

## 2019-01-22 LAB — COMPREHENSIVE METABOLIC PANEL
ALT: 8 U/L (ref 0–44)
AST: 15 U/L (ref 15–41)
Albumin: 3.3 g/dL — ABNORMAL LOW (ref 3.5–5.0)
Alkaline Phosphatase: 89 U/L (ref 38–126)
Anion gap: 12 (ref 5–15)
BUN: 31 mg/dL — ABNORMAL HIGH (ref 8–23)
CO2: 24 mmol/L (ref 22–32)
Calcium: 8.4 mg/dL — ABNORMAL LOW (ref 8.9–10.3)
Chloride: 101 mmol/L (ref 98–111)
Creatinine, Ser: 2.42 mg/dL — ABNORMAL HIGH (ref 0.44–1.00)
GFR calc Af Amer: 23 mL/min — ABNORMAL LOW (ref >60)
GFR calc non Af Amer: 20 mL/min — ABNORMAL LOW (ref >60)
Glucose, Bld: 82 mg/dL (ref 70–99)
Potassium: 4.3 mmol/L (ref 3.5–5.1)
Sodium: 137 mmol/L (ref 135–145)
Total Bilirubin: 0.1 mg/dL — ABNORMAL LOW (ref 0.3–1.2)
Total Protein: 6.4 g/dL — ABNORMAL LOW (ref 6.5–8.1)

## 2019-01-22 MED ORDER — SODIUM CHLORIDE 0.9 % IV SOLN: 1000.0000 mL | INTRAVENOUS | Status: DC

## 2019-01-22 MED ORDER — SODIUM CHLORIDE 0.9 % IV BOLUS (SEPSIS)
500.0000 mL | Freq: Once | INTRAVENOUS | Status: AC
  Administered 2019-01-22: 500 mL via INTRAVENOUS

## 2019-01-22 NOTE — ED Provider Notes (Signed)
[facility] EMERGENCY DEPARTMENT Provider Note   CSN: [id number] Arrival date & time: 01/22/19  [place]     History   Chief Complaint Chief Complaint  Patient presents with  . Hypotension    HPI Stephanie Combs is a 68 y.o. female.     Patient is a 68 year old female who presents to the emergency department by EMS because of hypotension.  The patient states that she was visited by her home health aide and her blood pressure was noted to be 90 systolic.  The home health aide called 911.  The patient was given some IV fluids and brought to the emergency department.  After the IV fluids the blood pressure was improved.  It is of note that the patient took her oxycodone and Zanaflex about 3 hours ago.  The patient has a history of colon cancer and non-small cell lung cancer in the left upper lobe.  She also suffers from diabetes mellitus, coronary artery disease, chronic heart failure, and anemia. The patient states that she has pain every day and she takes medication for pain and spasm every day.  Her eating has been on and off, but there is been no recent diarrhea.  The patient denies any recent fever, and no dysuria.  No blood noted in the stools.  No blood in the urine.  The history is provided by the patient.    Past Medical History:  Diagnosis Date  . Anxiety   . CAD (coronary artery disease)    a. s/p DES to LAD and angioplasty to D1 in 05/2017  . CHF (congestive heart failure) (HCC)    Diastolic  . Chronic back pain   . Chronic pain   . COPD (chronic obstructive pulmonary disease) ([place])   . Degenerative disk disease   . Diabetes mellitus without complication ([place])   . History of medication noncompliance 11/2017   "the patient frequently self-adjusts medications without notifying her physicians"  . Hypertension   . Hypothyroidism   . On home O2    2.5 L N/C prn  . Pedal edema     Patient Active Problem List   Diagnosis Date Noted  . Acute on chronic diastolic heart  failure ([place]) 10/11/2018  . Assistance needed with transportation 09/22/2018  . Primary cancer of hepatic flexure of colon ([place])   . Small cell lung cancer, left upper lobe ([place])   . Colonic mass 09/10/2018  . Colon polyps 09/10/2018  . Cellulitis of leg, left 09/10/2018  . Pain of lower extremity   . Non-intractable vomiting   . Abdominal pain, epigastric   . Heme positive stool   . Acute on chronic diastolic CHF (congestive heart failure) ([place]) 09/06/2018  . Symptomatic anemia 08/31/2018  . Anemia 08/30/2018  . Chronic pain 07/05/2018  . On home O2 07/05/2018  . Opioid dependence ([place]) 07/05/2018  . CAD (coronary artery disease) 05/14/2018  . AKI (acute kidney injury) ([place]) 04/05/2018  . Cellulitis 01/10/2018  . Cellulitis of right lower extremity 01/07/2018  . Altered mental status 01/06/2018  . Type 2 diabetes mellitus with hypoglycemia without coma ([place]) 11/24/2017  . Anxiety 11/24/2017  . Chronic respiratory failure with hypoxia ([place]) 11/24/2017  . Syncope 10/24/2017  . Bradycardia   . Syncope due to orthostatic hypotension 09/04/2017  . Obesity, Class III, BMI 40-49.9 (morbid obesity) ([place]) 09/04/2017  . Abnormal nuclear stress test 05/19/2017  . Atypical angina ([place]) 05/19/2017  . Edema 05/15/2017  . Skin ulcer ([place]) 01/30/2017  . Tinea  cruris 10/10/2016  . Vitamin D deficiency 08/19/2016  . Personal history of noncompliance with medical treatment, presenting hazards to health 06/17/2016  . CAP (community acquired pneumonia) 07/25/2015  . Pressure ulcer 05/02/2015  . Hyponatremia 05/01/2015  . Acute-on-chronic kidney injury ([place]) 05/01/2015  . Uncontrolled type 2 diabetes mellitus with stage 3 chronic kidney disease ([place]) 05/01/2015  . Cellulitis of foot, right 03/24/2015  . Peripheral edema 12/04/2014  . Acute renal failure superimposed on stage 3 chronic kidney disease ([place]) 11/24/2014  . Bilateral lower extremity edema   . Diabetic ulcer of right great toe  ([place])   . Foot ulcer due to secondary DM ([place]) 11/07/2014  . Diabetic ulcer of right foot ([place]) 11/07/2014  . Edema of lower extremity 05/27/2013  . CKD (chronic kidney disease), stage III ([place]) 04/14/2013  . Anemia in CKD (chronic kidney disease) 04/14/2013  . Chest pain 04/14/2013  . Dyspnea 04/14/2013  . Chronic diastolic CHF (congestive heart failure) ([place])   . Hypothyroidism   . COPD (chronic obstructive pulmonary disease) ([place]) 08/07/2012  . Morbid obesity ([place]) 08/07/2012  . DM type 2 (diabetes mellitus, type 2) ([place]) 08/07/2012  . HTN (hypertension), benign 08/07/2012    Past Surgical History:  Procedure Laterality Date  . BIOPSY  09/09/2018   Procedure: BIOPSY;  Surgeon: Daneil Dolin, MD;  [facility];  Service: Endoscopy;;  gastric esophageal hepatic flexure colon  . COLONOSCOPY WITH PROPOFOL N/A 09/09/2018   Procedure: COLONOSCOPY WITH PROPOFOL;  Surgeon: Daneil Dolin, MD;  Location: [facility];  Service: Endoscopy;  Laterality: N/A;  . CORONARY STENT INTERVENTION N/A 05/22/2017   Procedure: CORONARY STENT INTERVENTION;  Surgeon: Martinique, Peter M, MD;  Location: [facility];  Service: Cardiovascular;  Laterality: N/A;  . ESOPHAGOGASTRODUODENOSCOPY (EGD) WITH PROPOFOL N/A 09/09/2018   Procedure: ESOPHAGOGASTRODUODENOSCOPY (EGD) WITH PROPOFOL;  Surgeon: Daneil Dolin, MD;  [facility];  Service: Endoscopy;  Laterality: N/A;  . HERNIA REPAIR    . POLYPECTOMY  09/09/2018   Procedure: POLYPECTOMY;  Surgeon: Daneil Dolin, MD;  [facility]: [facility];  Service: Endoscopy;;  colon  . RIGHT/LEFT HEART CATH AND CORONARY ANGIOGRAPHY N/A 05/19/2017   Procedure: RIGHT/LEFT HEART CATH AND CORONARY ANGIOGRAPHY;  Surgeon: Leonie Man, MD;  Location: [facility];  Service: Cardiovascular;  Laterality: N/A;  . TUBAL LIGATION    . VIDEO BRONCHOSCOPY WITH ENDOBRONCHIAL ULTRASOUND Left 09/14/2018   Procedure: VIDEO BRONCHOSCOPY WITH  ENDOBRONCHIAL ULTRASOUND with biopsies;  Surgeon: Margaretha Seeds, MD;  Location: [facility];  Service: Thoracic;  Laterality: Left;     OB History   No obstetric history on file.      Home Medications    Prior to Admission medications   Medication Sig Start Date End Date Taking? Authorizing Provider  albuterol (PROAIR HFA) 108 (90 Base) MCG/ACT inhaler Inhale 2 puffs into the lungs every 4 (four) hours as needed for wheezing or shortness of breath. For shortness of breath 09/22/18   Eugenie Filler, MD  albuterol (PROVENTIL) (2.5 MG/3ML) 0.083% nebulizer solution Take 2.5 mg by nebulization 2 (two) times daily as needed for wheezing or shortness of breath.     [provider]  allopurinol (ZYLOPRIM) 300 MG tablet Take 1 tablet (300 mg total) by mouth daily for 15 days. 12/15/18 12/30/18  Truitt Merle, MD  allopurinol (ZYLOPRIM) 300 MG tablet Take 300 mg by mouth daily.    [provider]  ALPRAZolam Duanne Moron) 1  MG tablet Take 1 mg by mouth 3 (three) times daily as needed for anxiety or sleep.  01/05/17   [provider]  aspirin EC 81 MG tablet Take 81 mg by mouth daily.    [provider]  atorvastatin (LIPITOR) 80 MG tablet Take 1 tablet (80 mg total) by mouth daily at 6 PM. 05/26/17   [name], MD  cholecalciferol (VITAMIN D3) 25 MCG (1000 UT) tablet Take 5,000 Units by mouth daily.    [provider]  diphenoxylate-atropine (LOMOTIL) 2.5-0.025 MG tablet Take 1 tablet by mouth 4 (four) times daily as needed for diarrhea or loose stools.  01/03/19   [provider]  furosemide (LASIX) 20 MG tablet Take 20 mg by mouth every evening.    [provider]  furosemide (LASIX) 40 MG tablet Take 60 mg (1 1/2 tabs) in the morning and 40 mg (1 tab) in the afternoon Patient taking differently: Take 40 mg by mouth daily.  10/30/18   Danford, Suann Larry, MD  gabapentin (NEURONTIN) 600 MG tablet Take 600 mg by mouth 3 (three) times daily.      [provider]  hydrocortisone (ANUSOL-HC) 2.5 % rectal cream Place 1 application rectally 4 (four) times daily as needed for hemorrhoids.  01/03/19   [provider]  Hydrocortisone (GERHARDT'S BUTT CREAM) CREA Apply 1 application topically 4 (four) times daily as needed for irritation. 12/16/18   Truitt Merle, MD  insulin aspart (NOVOLOG) 100 UNIT/ML injection Inject 1-18 Units into the skin 3 (three) times daily with meals. Patient not taking: Reported on 01/20/2019 04/09/18   Irwin Brakeman L, MD  insulin detemir (LEVEMIR) 100 UNIT/ML injection Inject 0.4 mLs (40 Units total) into the skin at bedtime. 11/26/17   Orson Eva, MD  insulin lispro (HUMALOG) 100 UNIT/ML injection Inject 0-20 Units into the skin 3 (three) times daily with meals.  01/03/19   [provider]  levothyroxine (SYNTHROID, LEVOTHROID) 200 MCG tablet Take 1 tablet by mouth daily before breakfast.  06/24/18   [provider]  nitroGLYCERIN (NITROSTAT) 0.4 MG SL tablet Place 0.4 mg under the tongue every 5 (five) minutes as needed for chest pain.  03/26/18   [provider]  nystatin (MYCOSTATIN/NYSTOP) powder Apply topically 2 (two) times daily. 12/16/18   Truitt Merle, MD  Omega-3 Fatty Acids (FISH OIL PO) Take 1 capsule by mouth daily.     [provider]  ondansetron (ZOFRAN) 8 MG tablet Take 1 tablet (8 mg total) by mouth every 8 (eight) hours as needed for nausea or vomiting. 12/15/18   Truitt Merle, MD  ondansetron (ZOFRAN-ODT) 8 MG disintegrating tablet Take 8 mg by mouth every 8 (eight) hours as needed for nausea or vomiting.  01/03/19   [provider]  oxyCODONE-acetaminophen (PERCOCET) 10-325 MG tablet Take 1 tablet by mouth every 6 (six) hours as needed for pain.    [provider]  OXYGEN Inhale 2 L into the lungs continuous.    [provider]  pantoprazole (PROTONIX) 40 MG tablet Take 1 tablet (40 mg total) by mouth 2 (two) times daily. 10/07/18   Truitt Merle, MD  polyethylene glycol (MIRALAX / GLYCOLAX) 17 g packet Take 17 g by mouth 2 (two) times daily. 09/21/18   Arrien, Jimmy Picket, MD  potassium chloride (MICRO-K) 10 MEQ CR capsule Take 2 capsules (20 mEq total) by mouth 2 (two) times daily. *May take one additional tablet as needed for cramping Patient not taking: Reported on  01/20/2019 04/23/18   Herminio Commons, MD  potassium chloride SA (K-DUR) 20 MEQ tablet Take 20 mEq by mouth 2 (two) times daily.    [provider]  prochlorperazine (COMPAZINE) 10 MG tablet Take 1 tablet (10 mg total) by mouth every 6 (six) hours as needed for nausea or vomiting. 11/24/18   Truitt Merle, MD  promethazine (PHENERGAN) 12.5 MG tablet Take 1 tablet (12.5 mg total) by mouth every 6 (six) hours as needed for nausea or vomiting. 11/26/18   Tanner, Lyndon Code., PA-C  senna (SENOKOT) 8.6 MG TABS tablet Take 2 tablets (17.2 mg total) by mouth 2 (two) times daily with a meal. 09/21/18   Arrien, Jimmy Picket, MD  silver sulfADIAZINE (SILVADENE) 1 % cream Apply 1 application topically 2 (two) times daily.  01/03/19   [provider]  sucralfate (CARAFATE) 1 g tablet Take 1 tablet (1 g total) by mouth 4 (four) times daily -  with meals and at bedtime. 5 min before meals for radiation induced esophagitis 12/03/18   Tyler Pita, MD  SURE COMFORT INS SYR 1CC/28G 28G X 1/2" 1 ML MISC USE TO INJECT INSULIN UP TO 4 TIMES DAILY. 03/06/17   Cassandria Anger, MD  tiZANidine (ZANAFLEX) 4 MG tablet Take 1 tablet (4 mg total) by mouth every 8 (eight) hours as needed for muscle spasms. 07/08/18   Kathie Dike, MD  topiramate (TOPAMAX) 25 MG tablet Take 75 mg by mouth at bedtime.  03/18/18   [provider]  umeclidinium-vilanterol (ANORO ELLIPTA) 62.5-25 MCG/INH AEPB Inhale 1 puff into the lungs daily. 05/26/17   [name], MD    Family History Family History  Problem Relation Age of Onset  . Stroke Mother        deceased 44   . Heart attack  Father        deceased 74   . Diabetes Brother   . Cerebral palsy Brother   . Pneumonia Brother   . Diabetes Other   . Heart attack Other   . Colon cancer Neg Hx     Social History Social History   Tobacco Use  . Smoking status: Former Smoker    Packs/day: 1.50    Years: 45.00    Pack years: 67.50    Types: Cigarettes    Quit date: 11/24/2014    Years since quitting: 4.1  . Smokeless tobacco: Never Used  Substance Use Topics  . Alcohol use: No    Alcohol/week: 0.0 standard drinks  . Drug use: No     Allergies   Dilaudid [hydromorphone hcl], Midodrine hcl, Actifed cold-allergy [chlorpheniramine-phenylephrine], Doxycycline, Other, Penicillins, Reglan [metoclopramide], Valium [diazepam], and Vistaril [hydroxyzine hcl]   Review of Systems Review of Systems  Constitutional: Positive for appetite change and fatigue. Negative for activity change.  HENT: Negative for congestion, ear discharge, ear pain, facial swelling, nosebleeds, rhinorrhea, sneezing and tinnitus.   Eyes: Negative for photophobia, pain and discharge.  Respiratory: Negative for cough, choking, shortness of breath and wheezing.   Cardiovascular: Negative for chest pain, palpitations and leg swelling.  Gastrointestinal: Negative for abdominal pain, blood in stool, constipation, diarrhea, nausea and vomiting.  Genitourinary: Negative for difficulty urinating, dysuria, flank pain, frequency and hematuria.  Musculoskeletal: Positive for arthralgias and back pain. Negative for gait problem, myalgias and neck pain.  Skin: Negative for color change, rash and wound.  Neurological: Negative for dizziness, seizures, syncope, facial asymmetry, speech difficulty, weakness and numbness.  Hematological: Negative for adenopathy. Does not bruise/bleed  easily.  Psychiatric/Behavioral: Negative for agitation, confusion, hallucinations, self-injury and suicidal ideas. The patient is not nervous/anxious.      Physical Exam  Updated Vital Signs BP (!) 109/50   Pulse (!) 53   Temp 97.8 F (36.6 C) (Oral)   Resp 18   Ht 5\' 7"  (1.702 m)   Wt 128.2 kg   SpO2 100%   BMI 44.27 kg/m   Physical Exam Vitals signs and nursing note reviewed.  Constitutional:      Appearance: She is well-developed. She is morbidly obese. She is not toxic-appearing or diaphoretic.  HENT:     Head: Normocephalic.     Right Ear: Tympanic membrane and external ear normal.     Left Ear: Tympanic membrane and external ear normal.  Eyes:     General: Lids are normal.     Pupils: Pupils are equal, round, and reactive to light.  Neck:     Musculoskeletal: Normal range of motion and neck supple.     Vascular: No carotid bruit.  Cardiovascular:     Rate and Rhythm: Regular rhythm. Bradycardia present.     Pulses: Normal pulses.     Heart sounds: Normal heart sounds.  Pulmonary:     Effort: No respiratory distress.     Breath sounds: Normal breath sounds.  Abdominal:     General: Bowel sounds are normal.     Palpations: Abdomen is soft.     Tenderness: There is no abdominal tenderness. There is no guarding.  Musculoskeletal: Normal range of motion.        General: Swelling and tenderness present.     Right lower leg: Edema present.     Left lower leg: Edema present.  Lymphadenopathy:     Head:     Right side of head: No submandibular adenopathy.     Left side of head: No submandibular adenopathy.     Cervical: No cervical adenopathy.  Skin:    General: Skin is warm and dry.  Neurological:     Mental Status: She is alert and oriented to person, place, and time.     Cranial Nerves: No cranial nerve deficit.     Sensory: No sensory deficit.  Psychiatric:        Speech: Speech normal.      ED Treatments / Results  Labs (all labs ordered are listed, but only abnormal results are displayed) Labs Reviewed - No data to display  EKG None  Radiology No results found.  Procedures Procedures (including critical care  time)  Medications Ordered in ED Medications - No data to display   Initial Impression / Assessment and Plan / ED Course  I have reviewed the triage vital signs and the nursing notes.  Pertinent labs & imaging results that were available during my care of the patient were reviewed by me and considered in my medical decision making (see chart for details).          Final Clinical Impressions(s) / ED Diagnoses MDM  Vital signs reviewed.  Pulse rate is low at 53, blood pressure 109/50.  Pulse oximetry is 100% on 2 L of oxygen by nasal cannula.  Patient was brought to the emergency department because of low blood pressure.  Patient was treated with IV fluids here in the emergency department.  It is of note that the patient had taken Zanaflex and Percocet prior to her arrival in the emergency department.  The patient's blood pressure, pulse, and pulse oximetry were followed very  closely.  Patient had one episode in which her blood pressure dropped down to 94/62, otherwise the blood pressure remained steady at 098-119 systolic.  The electrocardiogram is negative for acute STEMI or life-threatening arrhythmias.  The complete blood count shows the white blood cells to be normal at 5200, the hemoglobin is low at 8.8, and the hematocrit is low at 28.7, however these are the patient's usual ranges.  There is no shift to the left.  Patient seen with me by Dr. Rogene Houston.  The comprehensive metabolic panel shows the BUN to be up to 31, and the creatinine up to 2.42.  Patient has a history of chronic renal issues.  We will have the patient to follow-up with this issue with her primary physician and her cancer specialist.  The anion gap is normal at 12.  Chest x-ray shows the patient to be rotated.  There is incomplete inclusion of the right base.  The radiologist suspects a right pleural effusion and basilar airspace disease.  There is also enlarged cardiomediastinal silhouette.  This is most likely  related to the hilar mass present.  We will asked the patient to also have this rechecked by her primary physician and her cancer specialist.   Patient given instructions to contact her primary physician and cancer specialist on Monday, September 14.  Patient acknowledges understanding of these instructions.  She will return if any additional problems with her blood pressure, changes in her condition, problems or concerns.   Final diagnoses:  Hypotension, unspecified hypotension type    ED Discharge Orders    None       Lily Kocher, PA-C 01/22/19 2132    Fredia Sorrow, MD 02/05/19 [phone]

## 2019-01-22 NOTE — Discharge Instructions (Addendum)
Your vital signs have been reviewed.  Your blood pressure is much improved from the numbers that were obtained at your home today by the home health nurse.  Your oxygen level is between 97% and 100%.  There are no significant changes on your x-ray or your blood work at this time.  Please continue your current medication.  Please increase your fluids.  Please follow-up with your cancer specialist and your primary physician.  Please return to the emergency department if any worsening of your symptoms, changes in your condition, problems, or concerns.

## 2019-01-22 NOTE — ED Provider Notes (Signed)
Medical screening examination/treatment/procedure(s) were conducted as a shared visit with non-physician practitioner(s) and myself.  I personally evaluated the patient during the encounter.  EKG Interpretation  Date/Time:  Saturday January 22 2019 17:28:00 EDT Ventricular Rate:  56 PR Interval:    QRS Duration: 107 QT Interval:  510 QTC Calculation: 493 R Axis:   71 Text Interpretation:  Sinus rhythm Atrial premature complex Borderline prolonged QT interval Confirmed by Fredia Sorrow [phone]) on 01/22/2019 6:13:34 PM    Patient seen by me along with physician assistant.  Patient sent in for concerns for low blood pressure.  By home health nurse.  Blood pressure was apparently 90.  EMS had given her 500 cc of fluid on the way in.  Blood pressure was improved upon arrival.  Patient given some additional IV fluids here.  Blood pressures pretty much remaining low 062B systolic.  Patient feeling better with the fluids.  Labs with only significance would be some slight worsening in her creatinine and BUN.  Patient is fluctuated in the past.  Will recommend she follow back up with heme-onc clinic this week.  Patient was receiving Lasix.  Last dose was on Thursday.  Recommend that she not continue the Lasix at this point in time.  Patient does admit she is not eating or drinking very well due to her radiation treatment course.  Patient also was on pain medication.  Had last dose 3 hours prior to arrival.  Patient is being treated for lung and colon cancer.  Patient stable for discharge home.   Fredia Sorrow, MD 01/22/19 2110

## 2019-01-22 NOTE — ED Triage Notes (Signed)
Pt states her home health aide came to the house today and checked her blood pressure and it was in the 31P systolic so she called 216. Pt states she took her oxycodone and tizanidine about 3 hours ago and is sleepy.

## 2019-01-24 ENCOUNTER — Other Ambulatory Visit: Payer: Self-pay

## 2019-01-24 NOTE — Patient Outreach (Signed)
[facility]) Care Management  01/24/2019  Stephanie Combs 1951/04/10 [id number]    Brief outreach with Stephanie Combs. Reports feeling well but attempting to rest at the time of the call. She is scheduled for a clinic visit with Dr. Burr Medico on 02/03/19 and will need transportation assistance. Reports having a designated point of contact but being unable to locate the number. I will follow-up with Oncology staff to confirm. Will submit referral for [facility] if other transportation arrangements are required.  [place] Care Management [phone]

## 2019-01-25 ENCOUNTER — Telehealth: Payer: Self-pay

## 2019-01-25 ENCOUNTER — Other Ambulatory Visit: Payer: Self-pay | Admitting: Nurse Practitioner

## 2019-01-25 MED ORDER — ONDANSETRON 8 MG PO TBDP: 8.0000 mg | ORAL_TABLET | Freq: Three times a day (TID) | ORAL | 1 refills | Status: DC | PRN

## 2019-01-25 MED ORDER — PROMETHAZINE HCL 12.5 MG PO TABS: 12.5000 mg | ORAL_TABLET | Freq: Four times a day (QID) | ORAL | 1 refills | Status: DC | PRN

## 2019-01-25 NOTE — Telephone Encounter (Signed)
Patient calls requesting refills on Zofran 8 mg ODT and Penergan 12.5 mg tablets, still experiencing a lot of nausea, has been to ED a couple of times.    She would like this sent into [facility] on file.  She also wants to know if when she comes in on 9/24 she can have her TSH and A1C checked that day.

## 2019-01-26 ENCOUNTER — Other Ambulatory Visit: Payer: Self-pay | Admitting: Hematology

## 2019-01-26 DIAGNOSIS — I129 Hypertensive chronic kidney disease with stage 1 through stage 4 chronic kidney disease, or unspecified chronic kidney disease: Secondary | ICD-10-CM | POA: Diagnosis not present

## 2019-01-26 DIAGNOSIS — E1122 Type 2 diabetes mellitus with diabetic chronic kidney disease: Secondary | ICD-10-CM

## 2019-01-26 DIAGNOSIS — I5032 Chronic diastolic (congestive) heart failure: Secondary | ICD-10-CM | POA: Diagnosis not present

## 2019-01-26 DIAGNOSIS — E1165 Type 2 diabetes mellitus with hyperglycemia: Secondary | ICD-10-CM | POA: Diagnosis not present

## 2019-01-26 DIAGNOSIS — N182 Chronic kidney disease, stage 2 (mild): Secondary | ICD-10-CM

## 2019-01-26 DIAGNOSIS — Z794 Long term (current) use of insulin: Secondary | ICD-10-CM | POA: Diagnosis not present

## 2019-01-26 DIAGNOSIS — D649 Anemia, unspecified: Secondary | ICD-10-CM

## 2019-01-26 DIAGNOSIS — E119 Type 2 diabetes mellitus without complications: Secondary | ICD-10-CM | POA: Diagnosis not present

## 2019-01-26 DIAGNOSIS — E039 Hypothyroidism, unspecified: Secondary | ICD-10-CM | POA: Diagnosis not present

## 2019-01-29 DIAGNOSIS — I5022 Chronic systolic (congestive) heart failure: Secondary | ICD-10-CM | POA: Diagnosis not present

## 2019-01-29 DIAGNOSIS — I130 Hypertensive heart and chronic kidney disease with heart failure and stage 1 through stage 4 chronic kidney disease, or unspecified chronic kidney disease: Secondary | ICD-10-CM | POA: Diagnosis not present

## 2019-01-29 DIAGNOSIS — L02211 Cutaneous abscess of abdominal wall: Secondary | ICD-10-CM | POA: Diagnosis not present

## 2019-01-29 DIAGNOSIS — I5033 Acute on chronic diastolic (congestive) heart failure: Secondary | ICD-10-CM | POA: Diagnosis not present

## 2019-01-29 DIAGNOSIS — E1122 Type 2 diabetes mellitus with diabetic chronic kidney disease: Secondary | ICD-10-CM | POA: Diagnosis not present

## 2019-01-31 NOTE — Progress Notes (Signed)
[place]   [phone] [phone]   Clinic Follow up Note   Patient Care Team: [name], MD as PCP - General (Internal Medicine) Herminio Commons, MD as PCP - Cardiology (Cardiology) Neldon Labella, RN as [place] Management Truitt Merle, MD as Consulting Physician (Hematology) Phillips Odor, MD as Consulting Physician (Neurology)  Date of Service:  02/03/2019  CHIEF COMPLAINT: F/u Small cell lung cancer and colon cancer   SUMMARY OF ONCOLOGIC HISTORY: Oncology History Overview Note  Cancer Staging Primary cancer of hepatic flexure of colon [facility]) Staging form: Colon and Rectum, AJCC 8th Edition - Clinical stage from 09/09/2018: Stage Unknown (cTX, cN0, cM0) - Signed by Truitt Merle, MD on 09/28/2018  Small cell lung cancer, left upper lobe ([place]) Staging form: Lung, AJCC 8th Edition - Clinical stage from 09/14/2018: Stage IIIB (cT3, cN2, cM0) - Signed by Truitt Merle, MD on 09/28/2018     Primary cancer of hepatic flexure of colon [facility])   Initial Diagnosis   Malignant neoplasm of hepatic flexure ([place])   09/09/2018 Procedure   Coloscopy by Dr Gala Romney 09/09/18  IMPRESSION -Colon mass as described in the vicinity of the hepatic flexure - status post biopsy and inking. Polyps associated with this tumor scattered all the way to the cecum were not removed; multiple splenic flexure and rectal polyps were removed as described above. Left-sided diverticulosis.  Upper endoscopy By Dr Gala Romney 09/09/18  IMPRESSION -Abnormal distal esophagus suspicious for Barrett's esophagus?biopsied. - Small hiatal hernia. - Erosive gastropathy. Biopsied. -Duodenal bulbar erosions   09/09/2018 Initial Biopsy   Diagnosis 09/09/18 1. Stomach, biopsy - GASTRIC ANTRAL MUCOSA WITH MILD NONSPECIFIC REACTIVE GASTROPATHY - GASTRIC OXYNTIC MUCOSA WITH PARIETAL CELL HYPERPLASIA AS CAN BE SEEN IN HYPERGASTRINEMIC STATES SUCH AS PROPER PATIENT IDENTIFICATION  THERAPY. - WARTHIN STARRY STAIN IS NEGATIVE FOR HELICOBACTER PYLORI 2. Esophagus, biopsy - BARRETT'S ESOPHAGUS, NEGATIVE FOR DYSPLASIA 3. Colon, polyp(s), descending - TUBULAR ADENOMA - NEGATIVE FOR HIGH-GRADE DYSPLASIA OR MALIGNANCY 4. Colon, biopsy, hepatic flexure - ADENOCARCINOMA. SEE NOTE 5. Colon, polyp(s), splenic flexure - ADENOCARCINOMA. SEE NOTE - OTHER FRAGMENTS OF TUBULAR ADENOMA(S) WITH ONE SHOWING HIGH-GRADE DYSPLASIA 6. Rectum, polyp(s) - TUBULAR ADENOMA(S) - NEGATIVE FOR HIGH-GRADE DYSPLASIA OR MALIGNANCY   09/09/2018 Cancer Staging   Staging form: Colon and Rectum, AJCC 8th Edition - Clinical stage from 09/09/2018: Stage Unknown (cTX, cN0, cM0) - Signed by Truitt Merle, MD on 09/28/2018   09/11/2018 Imaging   CT CAP 09/11/18  IMPRESSION: 1. Central perihilar left upper lobe 5.4 cm lung mass, new since 01/27/2017 chest CT, compatible with malignancy, favor primary bronchogenic carcinoma. Postobstructive pneumonia/atelectasis in the lingula. 2. Mediastinal adenopathy in the left prevascular/AP window and subcarinal stations, compatible with metastatic nodes (more likely originating from the left upper lobe lung mass). 3. Trace dependent left pleural effusion. 4. Apple-core lesion in the ascending colon, compatible with known primary colonic neoplasm. Adjacent top-normal size right mesenteric lymph node is equivocal for locoregional nodal metastasis. No additional potential findings of metastatic disease in the abdomen or pelvis. 5. Chronic findings include: Aortic Atherosclerosis (ICD10-I70.0). Borderline mild cardiomegaly. One vessel coronary atherosclerosis. Dilated main pulmonary artery, suggesting pulmonary arterial hypertension. Left adrenal adenoma. Punctate bilateral nephrolithiasis. Small hiatal hernia.   09/20/2018 Imaging   Brain MRI 09/20/18  IMPRESSION: 1. Motion degraded examination without brain metastases identified. 2. Small skull metastases not  excluded. 3. Moderate to severe chronic small vessel ischemic disease.   Small cell lung cancer, left upper lobe (  [place])  09/11/2018 Imaging   CT CAP 09/11/18  IMPRESSION: 1. Central perihilar left upper lobe 5.4 cm lung mass, new since 01/27/2017 chest CT, compatible with malignancy, favor primary bronchogenic carcinoma. Postobstructive pneumonia/atelectasis in the lingula. 2. Mediastinal adenopathy in the left prevascular/AP window and subcarinal stations, compatible with metastatic nodes (more likely originating from the left upper lobe lung mass). 3. Trace dependent left pleural effusion. 4. Apple-core lesion in the ascending colon, compatible with known primary colonic neoplasm. Adjacent top-normal size right mesenteric lymph node is equivocal for locoregional nodal metastasis. No additional potential findings of metastatic disease in the abdomen or pelvis. 5. Chronic findings include: Aortic Atherosclerosis (ICD10-I70.0). Borderline mild cardiomegaly. One vessel coronary atherosclerosis. Dilated main pulmonary artery, suggesting pulmonary arterial hypertension. Left adrenal adenoma. Punctate bilateral nephrolithiasis. Small hiatal hernia.    09/14/2018 Initial Biopsy   Diagnosis 5/5.20 Lung, biopsy, LUL - SMALL CELL CARCINOMA.   09/14/2018 Cancer Staging   Staging form: Lung, AJCC 8th Edition - Clinical stage from 09/14/2018: Stage IIIB (cT3, cN2, cM0) - Signed by Truitt Merle, MD on 09/28/2018   09/15/2018 Initial Diagnosis   Small cell lung cancer, left upper lobe ([place])   09/18/2018 - 12/17/2018 Chemotherapy   Carboplatin and etoposide every 3 weeks for 4 cycles starting 09/18/18. Held after C2D1 due to COVID-19(+). She restarted with cycle 3 on 11/03/18. Completed 5 cycles on 12/17/18.  RT was concurrnet but started on 11/24/18.   09/20/2018 Imaging   Brain MRI 09/20/18  IMPRESSION: 1. Motion degraded examination without brain metastases identified. 2. Small skull metastases not excluded.  3. Moderate to severe chronic small vessel ischemic disease.    11/24/2018 - 01/06/2019 Radiation Therapy    RT was postponed to 11/24/18. Only concurrent with chemo for 2 cycles. Completed on 01/06/19      CURRENT THERAPY:  Observation  INTERVAL HISTORY:  Stephanie Combs is here for a follow up. She presents to the clinic alone. She notes she is getting better. She notes she has been taking buttermilk which once a milk to help her appetite that she can tolerate. She notes her legs are still swelling of LE is still there. She has lost 8-9 pounds in 4 weeks.  She notes she has been waking up mid night. She notes she would like to increase her Xanax.  She notes for the last 2 weeks she would go to bed by !2 and wake up around 3 and find it hard to go back to sleep.  This is managed by Dr. Phillips Odor pain specialist in [place].  She denies fever, chills and her nausea has much improved. She notes feeling hungry often, but still not eating much other than buttermilk. She notes she was released from wound clinic. Her posterior bed sore is healing from what she can tell. She is adamant about proceeding with more treatment for as long as she can tolerate. She opted to proceed with flu shot.    REVIEW OF SYSTEMS:   Constitutional: Denies fevers, chills (+) Low food intake, weight loss (+) trouble sleeping Eyes: Denies blurriness of vision Ears, nose, mouth, throat, and face: Denies mucositis or sore throat Respiratory: Denies cough, dyspnea or wheezes Cardiovascular: Denies palpitation, chest discomfort (+) b/l  lower extremity swelling mildly improved  Gastrointestinal:  Denies nausea, heartburn or change in bowel habits Skin: Denies abnormal skin rashes Lymphatics: Denies new lymphadenopathy or easy bruising Neurological:Denies numbness, tingling or new weaknesses Behavioral/Psych: Mood is stable, no new changes  All other  systems were reviewed with the patient and are negative.   MEDICAL HISTORY:  Past Medical History:  Diagnosis Date  . Anxiety   . CAD (coronary artery disease)    a. s/p DES to LAD and angioplasty to D1 in 05/2017  . CHF (congestive heart failure) ([facility])    Diastolic  . Chronic back pain   . Chronic pain   . COPD (chronic obstructive pulmonary disease) ([place])   . Degenerative disk disease   . Diabetes mellitus without complication ([place])   . History of medication noncompliance 11/2017   "the patient frequently self-adjusts medications without notifying her physicians"  . Hypertension   . Hypothyroidism   . On home O2    2.5 L N/C prn  . Pedal edema     SURGICAL HISTORY: Past Surgical History:  Procedure Laterality Date  . BIOPSY  09/09/2018   Procedure: BIOPSY;  Surgeon: Daneil Dolin, MD;  [facility];  Service: Endoscopy;;  gastric esophageal hepatic flexure colon  . COLONOSCOPY WITH PROPOFOL N/A 09/09/2018   Procedure: COLONOSCOPY WITH PROPOFOL;  Surgeon: Daneil Dolin, MD;  [facility];  Service: Endoscopy;  Laterality: N/A;  . CORONARY STENT INTERVENTION N/A 05/22/2017   Procedure: CORONARY STENT INTERVENTION;  Surgeon: Martinique, Peter M, MD;  Location: [place] [facility];  Service: Cardiovascular;  Laterality: N/A;  . ESOPHAGOGASTRODUODENOSCOPY (EGD) WITH PROPOFOL N/A 09/09/2018   Procedure: ESOPHAGOGASTRODUODENOSCOPY (EGD) WITH PROPOFOL;  Surgeon: Daneil Dolin, MD;  [facility];  Service: Endoscopy;  Laterality: N/A;  . HERNIA REPAIR    . POLYPECTOMY  09/09/2018   Procedure: POLYPECTOMY;  Surgeon: Daneil Dolin, MD;  [facility];  Service: Endoscopy;;  colon  . RIGHT/LEFT HEART CATH AND CORONARY ANGIOGRAPHY N/A 05/19/2017   Procedure: RIGHT/LEFT HEART CATH AND CORONARY ANGIOGRAPHY;  Surgeon: Leonie Man, MD;  Location: [place] [facility];  Service: Cardiovascular;  Laterality: N/A;  . TUBAL LIGATION    . VIDEO BRONCHOSCOPY WITH ENDOBRONCHIAL ULTRASOUND Left 09/14/2018    Procedure: VIDEO BRONCHOSCOPY WITH ENDOBRONCHIAL ULTRASOUND with biopsies;  Surgeon: Margaretha Seeds, MD;  Location: [place];  Service: Thoracic;  Laterality: Left;    I have reviewed the social history and family history with the patient and they are unchanged from previous note.  ALLERGIES:  is allergic to dilaudid [hydromorphone hcl]; midodrine hcl; actifed cold-allergy [chlorpheniramine-phenylephrine]; doxycycline; other; penicillins; reglan [metoclopramide]; valium [diazepam]; and vistaril [hydroxyzine hcl].  MEDICATIONS:  Current Outpatient Medications  Medication Sig Dispense Refill  . albuterol (PROAIR HFA) 108 (90 Base) MCG/ACT inhaler Inhale 2 puffs into the lungs every 4 (four) hours as needed for wheezing or shortness of breath. For shortness of breath 18 g 0  . albuterol (PROVENTIL) (2.5 MG/3ML) 0.083% nebulizer solution Take 2.5 mg by nebulization 2 (two) times daily as needed for wheezing or shortness of breath.     . allopurinol (ZYLOPRIM) 300 MG tablet Take 300 mg by mouth daily.    Marland Kitchen ALPRAZolam (XANAX) 1 MG tablet Take 1 mg by mouth 3 (three) times daily as needed for anxiety or sleep.     Marland Kitchen aspirin EC 81 MG tablet Take 81 mg by mouth daily.    Marland Kitchen atorvastatin (LIPITOR) 80 MG tablet Take 1 tablet (80 mg total) by mouth daily at 6 PM. 30 tablet 0  . cholecalciferol (VITAMIN D3) 25 MCG (1000 UT) tablet Take 5,000 Units by mouth daily.    . diphenoxylate-atropine (LOMOTIL) 2.5-0.025 MG tablet  Take 1 tablet by mouth 4 (four) times daily as needed for diarrhea or loose stools.     . furosemide (LASIX) 20 MG tablet Take 20 mg by mouth every evening.    . furosemide (LASIX) 40 MG tablet Take 60 mg (1 1/2 tabs) in the morning and 40 mg (1 tab) in the afternoon (Patient taking differently: Take 40 mg by mouth daily. ) 75 tablet 6  . gabapentin (NEURONTIN) 600 MG tablet Take 600 mg by mouth 3 (three) times daily.     . hydrocortisone (ANUSOL-HC) 2.5 % rectal cream Place 1 application  rectally 4 (four) times daily as needed for hemorrhoids.     . Hydrocortisone (GERHARDT'S BUTT CREAM) CREA Apply 1 application topically 4 (four) times daily as needed for irritation. 1 each 3  . insulin aspart (NOVOLOG) 100 UNIT/ML injection Inject 1-18 Units into the skin 3 (three) times daily with meals.    . insulin detemir (LEVEMIR) 100 UNIT/ML injection Inject 0.4 mLs (40 Units total) into the skin at bedtime. 10 mL 0  . insulin lispro (HUMALOG) 100 UNIT/ML injection Inject 0-20 Units into the skin 3 (three) times daily with meals.     Marland Kitchen levothyroxine (SYNTHROID, LEVOTHROID) 200 MCG tablet Take 1 tablet by mouth daily before breakfast.     . nitroGLYCERIN (NITROSTAT) 0.4 MG SL tablet Place 0.4 mg under the tongue every 5 (five) minutes as needed for chest pain.     Marland Kitchen nystatin (MYCOSTATIN/NYSTOP) powder Apply topically 2 (two) times daily. 15 g 0  . Omega-3 Fatty Acids (FISH OIL PO) Take 1 capsule by mouth daily.     . ondansetron (ZOFRAN) 8 MG tablet Take 1 tablet (8 mg total) by mouth every 8 (eight) hours as needed for nausea or vomiting. 120 tablet 2  . ondansetron (ZOFRAN-ODT) 8 MG disintegrating tablet Take 1 tablet (8 mg total) by mouth every 8 (eight) hours as needed for nausea or vomiting. 30 tablet 1  . oxyCODONE-acetaminophen (PERCOCET) 10-325 MG tablet Take 1 tablet by mouth every 6 (six) hours as needed for pain.    . OXYGEN Inhale 2 L into the lungs continuous.    . pantoprazole (PROTONIX) 40 MG tablet Take 1 tablet (40 mg total) by mouth 2 (two) times daily. 180 tablet 3  . polyethylene glycol (MIRALAX / GLYCOLAX) 17 g packet Take 17 g by mouth 2 (two) times daily. 14 each 0  . potassium chloride (MICRO-K) 10 MEQ CR capsule Take 2 capsules (20 mEq total) by mouth 2 (two) times daily. *May take one additional tablet as needed for cramping 120 capsule 3  . potassium chloride SA (K-DUR) 20 MEQ tablet Take 20 mEq by mouth 2 (two) times daily.    . prochlorperazine (COMPAZINE) 10 MG  tablet Take 1 tablet (10 mg total) by mouth every 6 (six) hours as needed for nausea or vomiting. 30 tablet 0  . promethazine (PHENERGAN) 12.5 MG tablet Take 1 tablet (12.5 mg total) by mouth every 6 (six) hours as needed for nausea or vomiting. 45 tablet 1  . senna (SENOKOT) 8.6 MG TABS tablet Take 2 tablets (17.2 mg total) by mouth 2 (two) times daily with a meal. 120 each 0  . silver sulfADIAZINE (SILVADENE) 1 % cream Apply 1 application topically 2 (two) times daily.     . sucralfate (CARAFATE) 1 g tablet Take 1 tablet (1 g total) by mouth 4 (four) times daily -  with meals and at bedtime. 5 min before meals  for radiation induced esophagitis 120 tablet 2  . SURE COMFORT INS SYR 1CC/28G 28G X 1/2" 1 ML MISC USE TO INJECT INSULIN UP TO 4 TIMES DAILY. 150 each 5  . tiZANidine (ZANAFLEX) 4 MG tablet Take 1 tablet (4 mg total) by mouth every 8 (eight) hours as needed for muscle spasms. 30 tablet 0  . topiramate (TOPAMAX) 25 MG tablet Take 75 mg by mouth at bedtime.     Marland Kitchen umeclidinium-vilanterol (ANORO ELLIPTA) 62.5-25 MCG/INH AEPB Inhale 1 puff into the lungs daily. 30 each 0  . allopurinol (ZYLOPRIM) 300 MG tablet Take 1 tablet (300 mg total) by mouth daily for 15 days. 30 tablet 3   No current facility-administered medications for this visit.     PHYSICAL EXAMINATION: ECOG PERFORMANCE STATUS: 3 - Symptomatic, >50% confined to bed  Vitals:   02/03/19 1304  BP: (!) 152/68  Pulse: 81  Resp: 18  Temp: 98.5 F (36.9 C)  SpO2: 98%   Filed Weights   02/03/19 1304  Weight: 276 lb 8 oz (125.4 kg)    GENERAL:alert, no distress and comfortable SKIN: skin color, texture, turgor are normal, no rashes or significant lesions (+) Abdominal sore healing with mild discharge (+) buttock bed sore healed  EYES: normal, Conjunctiva are pink and non-injected, sclera clear  NECK: supple, thyroid normal size, non-tender, without nodularity LYMPH:  no palpable lymphadenopathy in the cervical, axillary   LUNGS: clear to auscultation and percussion, no crackling (+) Mild wheezing HEART: regular rate & rhythm and no murmurs (+)B/l lower extremity edema, less skin erythema  ABDOMEN:abdomen soft, non-tender and normal bowel sounds Musculoskeletal:no cyanosis of digits and no clubbing  NEURO: alert & oriented x 3 with fluent speech, no focal motor/sensory deficits  LABORATORY DATA:  I have reviewed the data as listed CBC Latest Ref Rng & Units 02/03/2019 01/22/2019 01/20/2019  WBC 4.0 - 10.5 K/uL 5.9 5.2 7.2  Hemoglobin 12.0 - 15.0 g/dL 9.1(L) 8.8(L) 8.9(L)  Hematocrit 36.0 - 46.0 % 28.6(L) 28.7(L) 28.4(L)  Platelets 150 - 400 K/uL 175 224 206     CMP Latest Ref Rng & Units 02/03/2019 01/22/2019 01/20/2019  Glucose 70 - 99 mg/dL 206(H) 82 47(L)  BUN 8 - 23 mg/dL 34(H) 31(H) 28(H)  Creatinine 0.44 - 1.00 mg/dL 1.62(H) 2.42(H) 1.94(H)  Sodium 135 - 145 mmol/L 138 137 137  Potassium 3.5 - 5.1 mmol/L 5.3(H) 4.3 3.7  Chloride 98 - 111 mmol/L 102 101 100  CO2 22 - 32 mmol/L _0 Calcium 8.9 - 10.3 mg/dL 9.0 8.4(L) 9.0  Total Protein 6.5 - 8.1 g/dL 6.2(L) 6.4(L) 6.2(L)  Total Bilirubin 0.3 - 1.2 mg/dL 0.4 0.1(L) 0.3  Alkaline Phos 38 - 126 U/L 103 89 73  AST 15 - 41 U/L 10(L) 15 14(L)  ALT 0 - 44 U/L <_1 RADIOGRAPHIC STUDIES: I have personally reviewed the radiological images as listed and agreed with the findings in the report. No results found.   ASSESSMENT & PLAN:  MARCI POLITO is a 68 y.o. female with   1. Small Cell Lung cancer, LUL, cT3N2M0, stage III, limited stage  -Newly diagnosedin 5/2020during her work up for colon cancer -Ipreviouslydiscussed her lung cancer would take priority in terms of treatment given theaggressivenessof small cell lung cancer, and very high risk of recurrence evenwith curative intentconcurrentchemoradiation.   -She completed last cycle chemo on 8/7, with significant dose reduction. She completed radiation therapy on 01/06/19. She  overall tolerated both treatment moderately well  -will repeat scan in about 2 months to evaluate her response  -She continues to improve and recover well. Her performance status has improved. She is stronger and can stand on her own. Her LE edema continues to improve slowly. Her appetite is still low so I encouraged her to continue drinking buttermilk. Her nausea has much improved.  -I discussed the next step in treatment is prophylactic brain radiation to reduce risk of brain mets from lung cancer. I reviewed benefit and side effects, especially permanent hair loss and mild effects on memory. I encouraged her to discuss this further with PA Ebony Hail. She is agreeable and interested in Radiation.  -After she proceeds with RT, she can take a break and scan her before further discussing systemic treatment for colon cancer. She understands and is adamant about continuing as much treatment as she can tolerate.  -F/u in 4 weeks  -She opted to proceed with flu shot today   2. 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  -Unfortunately she is not a candidate for colon cancer surgery due to very high risk ofsurgical mortality.She was seen by surgery during her recent hospitalization.  -I discussed the incurablenature of her colon cancer without surgery. -Willcontinueto monitor on current treatment for her SCLC. -She is recovering well from chemoRT for SLCL. If she proceeds and does well with brain radiation and next scan I will consider low intensity chemo such as Xeloda or Irinotecan (which works for [employer] also). Given normal MMR, she is not a candidate for immunotherapy. Given primary right side colon cancer, not much benefit from EGFR inhibitors even if KRAS/NRAS/BRAF wild type. -I discussed treatment with chemo would be palliative to control her disease. She would continue for as  long as she can tolerate she progresses.She understands.   3. 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 -anemia slightly worse due to chemo -Continue monitoring -Last bloodtransfusionon 01/05/19  4. CAD, CHF, HTN, significant LE edema, CKD -OnLipitor, lasix, nitro -Continue to f/u with cardiologist. -Her CHFand fluid overload has muchimproved during her recent hospitalization. -She has been on 34m Lasix in the morning.She responded well to iv lasixwhich I give during his chemo -I previously held her oral lasix and gave iv lasix473mailyduring chemowhich she responded very well. With completion of chemo she takes alternating Lasix dose Lasix 4037mn the AM and 24m40m the PM. Continue -LE is improving slowly, R LE is larger than L. Continues to improve   5.DM,hypothyroidism, morbid obesity -On insulin novolog and long acting levemir. OnLevothyroxine. -Willmonitor glucose closely during her chemo treatment -stable  6.Rheumatoidarthritis, chronic back pain  -On Percocet which she takes 4 times a day.  -She is also hasGabapentin.  -She use to receiveinjectionfor her back -Pain chronic, stable. She will continue to see her RA and Dr. KofiPhillips Odorn [place]. Depression and anxiety -She has been more depressed with recent diagnosis of 2 cancers -She is currently on Celebrex, will continue. -stable  -She is on Xanax to help her sleep and her anxiety. This is managed by Dr. DoonMerlene Laughter she wants to request dose increase.   8.Goal of care discussion, DNR/DNI -We previously discussed the incurable nature of her cancer, and the overall poor prognosis, especially ifshe does not have good response to chemotherapy or progress on  chemo -The patient understands the goal of care is palliative. -I recommend DNR/DNI,she has  agreed to DRI/DNR.She has written will and has established her daughter as her POA.  9.History ofCOVID-19 -She was incidentally found to be positive for COVID-19on 10/11/2018, no respiratory symptoms or evidence of pneumonia,she hasGIsymptoms which could be relate -Her testing on 11/04/18 was positive again. -no COVID symptoms   Plan: -flu shot today  -She continues to clinically improve.  -will likely proceed with brain radiation soon, I sent a message to PA [facility]  -Lab and f/u in 4 weeks    No problem-specific Assessment & Plan notes found for this encounter.   Orders Placed This Encounter  Procedures  . [facility] (IN HOUSE-[facility])    Standing Status:   Standing    Number of Occurrences:   10    Standing Expiration Date:   02/03/2024   All questions were answered. The patient knows to call the clinic with any problems, questions or concerns. No barriers to learning was detected. I spent 20 minutes counseling the patient face to face. The total time spent in the appointment was 25 minutes and more than 50% was on counseling and review of test results     Truitt Merle, MD 02/03/2019   I, Joslyn Devon, am acting as scribe for Truitt Merle, MD.   I have reviewed the above documentation for accuracy and completeness, and I agree with the above.

## 2019-02-01 ENCOUNTER — Telehealth: Payer: Self-pay | Admitting: Radiation Oncology

## 2019-02-01 NOTE — Telephone Encounter (Signed)
  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 Her treatment was delayed due to testing positive for coronavirus.  Narrative:  The patient was contacted today for routine follow-up. During treatment she did very well with radiotherapy and did not have significant desquamation and esophagitis. She reports she is feeling better and used some homeopathic approaches to help her esophagitis and some OTC antihistamine cream for itchy skin. She is scheduled to follow up with Dr. Burr Medico later this week to make plans for additional treatment for her colon cancer.   Impression/Plan: 1.  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 The patient has been doing well since completion of radiotherapy. We discussed that she will  continue to follow up with Dr. Burr Medico in medical oncology, but that we would discuss when we could consider PCI with Dr. Burr Medico. 2. PCI. The patient is aware of the benefits from PCI to reduce the risk of small cell lung cancer spreading to the brain. We will need another MRI scan and simulation prior to proceeding but will check about the timing of this given plans to proceed with treatment for the colon cancer. 3. 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 She is going to meet with Dr. Burr Medico on Thursday to determine next steps.     Carola Rhine, PAC

## 2019-02-03 ENCOUNTER — Telehealth: Payer: Self-pay | Admitting: Hematology

## 2019-02-03 ENCOUNTER — Inpatient Hospital Stay (HOSPITAL_BASED_OUTPATIENT_CLINIC_OR_DEPARTMENT_OTHER): Payer: Medicare Other | Admitting: Hematology

## 2019-02-03 ENCOUNTER — Telehealth: Payer: Self-pay

## 2019-02-03 ENCOUNTER — Other Ambulatory Visit: Payer: Self-pay

## 2019-02-03 ENCOUNTER — Inpatient Hospital Stay: Payer: Medicare Other

## 2019-02-03 ENCOUNTER — Encounter: Payer: Self-pay | Admitting: Hematology

## 2019-02-03 VITALS — BP 152/68 | HR 81 | Temp 98.5°F | Resp 18 | Ht 67.0 in | Wt 276.5 lb

## 2019-02-03 DIAGNOSIS — J449 Chronic obstructive pulmonary disease, unspecified: Secondary | ICD-10-CM | POA: Diagnosis not present

## 2019-02-03 DIAGNOSIS — Z7982 Long term (current) use of aspirin: Secondary | ICD-10-CM | POA: Diagnosis not present

## 2019-02-03 DIAGNOSIS — K5900 Constipation, unspecified: Secondary | ICD-10-CM | POA: Diagnosis not present

## 2019-02-03 DIAGNOSIS — N183 Chronic kidney disease, stage 3 unspecified: Secondary | ICD-10-CM

## 2019-02-03 DIAGNOSIS — Z9221 Personal history of antineoplastic chemotherapy: Secondary | ICD-10-CM | POA: Diagnosis not present

## 2019-02-03 DIAGNOSIS — C3412 Malignant neoplasm of upper lobe, left bronchus or lung: Secondary | ICD-10-CM

## 2019-02-03 DIAGNOSIS — G8929 Other chronic pain: Secondary | ICD-10-CM | POA: Diagnosis not present

## 2019-02-03 DIAGNOSIS — I2510 Atherosclerotic heart disease of native coronary artery without angina pectoris: Secondary | ICD-10-CM | POA: Diagnosis not present

## 2019-02-03 DIAGNOSIS — M069 Rheumatoid arthritis, unspecified: Secondary | ICD-10-CM | POA: Diagnosis not present

## 2019-02-03 DIAGNOSIS — I130 Hypertensive heart and chronic kidney disease with heart failure and stage 1 through stage 4 chronic kidney disease, or unspecified chronic kidney disease: Secondary | ICD-10-CM | POA: Diagnosis not present

## 2019-02-03 DIAGNOSIS — E1122 Type 2 diabetes mellitus with diabetic chronic kidney disease: Secondary | ICD-10-CM

## 2019-02-03 DIAGNOSIS — Z23 Encounter for immunization: Secondary | ICD-10-CM

## 2019-02-03 DIAGNOSIS — C183 Malignant neoplasm of hepatic flexure: Secondary | ICD-10-CM

## 2019-02-03 DIAGNOSIS — N182 Chronic kidney disease, stage 2 (mild): Secondary | ICD-10-CM

## 2019-02-03 DIAGNOSIS — D500 Iron deficiency anemia secondary to blood loss (chronic): Secondary | ICD-10-CM | POA: Diagnosis not present

## 2019-02-03 DIAGNOSIS — E039 Hypothyroidism, unspecified: Secondary | ICD-10-CM | POA: Diagnosis not present

## 2019-02-03 DIAGNOSIS — R110 Nausea: Secondary | ICD-10-CM | POA: Diagnosis not present

## 2019-02-03 DIAGNOSIS — I10 Essential (primary) hypertension: Secondary | ICD-10-CM | POA: Diagnosis not present

## 2019-02-03 DIAGNOSIS — M549 Dorsalgia, unspecified: Secondary | ICD-10-CM | POA: Diagnosis not present

## 2019-02-03 DIAGNOSIS — R600 Localized edema: Secondary | ICD-10-CM | POA: Diagnosis not present

## 2019-02-03 DIAGNOSIS — T451X5A Adverse effect of antineoplastic and immunosuppressive drugs, initial encounter: Secondary | ICD-10-CM | POA: Diagnosis not present

## 2019-02-03 DIAGNOSIS — Z923 Personal history of irradiation: Secondary | ICD-10-CM | POA: Diagnosis not present

## 2019-02-03 DIAGNOSIS — N189 Chronic kidney disease, unspecified: Secondary | ICD-10-CM | POA: Diagnosis not present

## 2019-02-03 DIAGNOSIS — Z794 Long term (current) use of insulin: Secondary | ICD-10-CM

## 2019-02-03 DIAGNOSIS — D6481 Anemia due to antineoplastic chemotherapy: Secondary | ICD-10-CM | POA: Diagnosis not present

## 2019-02-03 DIAGNOSIS — Z9981 Dependence on supplemental oxygen: Secondary | ICD-10-CM | POA: Diagnosis not present

## 2019-02-03 DIAGNOSIS — F418 Other specified anxiety disorders: Secondary | ICD-10-CM | POA: Diagnosis not present

## 2019-02-03 DIAGNOSIS — D649 Anemia, unspecified: Secondary | ICD-10-CM

## 2019-02-03 DIAGNOSIS — I5030 Unspecified diastolic (congestive) heart failure: Secondary | ICD-10-CM | POA: Diagnosis not present

## 2019-02-03 DIAGNOSIS — I5032 Chronic diastolic (congestive) heart failure: Secondary | ICD-10-CM

## 2019-02-03 LAB — CMP (CANCER CENTER ONLY)
ALT: <6 U/L (ref 0–44)
AST: 10 U/L — ABNORMAL LOW (ref 15–41)
Albumin: 3.2 g/dL — ABNORMAL LOW (ref 3.5–5.0)
Alkaline Phosphatase: 103 U/L (ref 38–126)
Anion gap: 8 (ref 5–15)
BUN: 34 mg/dL — ABNORMAL HIGH (ref 8–23)
CO2: 28 mmol/L (ref 22–32)
Calcium: 9 mg/dL (ref 8.9–10.3)
Chloride: 102 mmol/L (ref 98–111)
Creatinine: 1.62 mg/dL — ABNORMAL HIGH (ref 0.44–1.00)
GFR, Est AFR Am: 37 mL/min — ABNORMAL LOW (ref >60)
GFR, Estimated: 32 mL/min — ABNORMAL LOW (ref >60)
Glucose, Bld: 206 mg/dL — ABNORMAL HIGH (ref 70–99)
Potassium: 5.3 mmol/L — ABNORMAL HIGH (ref 3.5–5.1)
Sodium: 138 mmol/L (ref 135–145)
Total Bilirubin: 0.4 mg/dL (ref 0.3–1.2)
Total Protein: 6.2 g/dL — ABNORMAL LOW (ref 6.5–8.1)

## 2019-02-03 LAB — CBC WITH DIFFERENTIAL (CANCER CENTER ONLY)
Abs Immature Granulocytes: 0.01 10*3/uL (ref 0.00–0.07)
Basophils Absolute: 0.1 10*3/uL (ref 0.0–0.1)
Basophils Relative: 1 %
Eosinophils Absolute: 0.3 10*3/uL (ref 0.0–0.5)
Eosinophils Relative: 5 %
HCT: 28.6 % — ABNORMAL LOW (ref 36.0–46.0)
Hemoglobin: 9.1 g/dL — ABNORMAL LOW (ref 12.0–15.0)
Immature Granulocytes: 0 %
Lymphocytes Relative: 21 %
Lymphs Abs: 1.2 10*3/uL (ref 0.7–4.0)
MCH: 33 pg (ref 26.0–34.0)
MCHC: 31.8 g/dL (ref 30.0–36.0)
MCV: 103.6 fL — ABNORMAL HIGH (ref 80.0–100.0)
Monocytes Absolute: 0.6 10*3/uL (ref 0.1–1.0)
Monocytes Relative: 11 %
Neutro Abs: 3.6 10*3/uL (ref 1.7–7.7)
Neutrophils Relative %: 62 %
Platelet Count: 175 10*3/uL (ref 150–400)
RBC: 2.76 MIL/uL — ABNORMAL LOW (ref 3.87–5.11)
RDW: 16.2 % — ABNORMAL HIGH (ref 11.5–15.5)
WBC Count: 5.9 10*3/uL (ref 4.0–10.5)
nRBC: 0 % (ref 0.0–0.2)

## 2019-02-03 LAB — TSH: TSH: 0.112 u[IU]/mL — ABNORMAL LOW (ref 0.308–3.960)

## 2019-02-03 LAB — HEMOGLOBIN A1C
Hgb A1c MFr Bld: 6.5 % — ABNORMAL HIGH (ref 4.8–5.6)
Mean Plasma Glucose: 139.85 mg/dL

## 2019-02-03 LAB — CEA (IN HOUSE-CHCC): CEA (CHCC-In House): 1.25 ng/mL (ref 0.00–5.00)

## 2019-02-03 MED ORDER — INFLUENZA VAC A&B SA ADJ QUAD 0.5 ML IM PRSY
0.5000 mL | PREFILLED_SYRINGE | Freq: Once | INTRAMUSCULAR | Status: AC
  Administered 2019-02-03: 0.5 mL via INTRAMUSCULAR

## 2019-02-03 NOTE — Telephone Encounter (Signed)
Stephanie Combs,  I talked to her about PCI today, she was not aware the potential side effects from PCI, such as permanent hair loss, memory loss etc, but still wants to proceed. Could you give her a call to make sure she fully understands the benefit and risks, before proceed? Thanks much.   I plan to repeat scan in about 2 months, then probably start her on chemo for colon cancer. Thanks   Truitt Merle MD

## 2019-02-03 NOTE — Telephone Encounter (Signed)
Staff message was sent to Dr. Merlene Laughter regarding patient's Xanax and her need to take it 3 times daily due to her terminal illness and high anxiety.

## 2019-02-03 NOTE — Telephone Encounter (Signed)
Scheduled appt per 9/24 los.  Printed calendar and avs.

## 2019-02-04 ENCOUNTER — Other Ambulatory Visit: Payer: Self-pay

## 2019-02-04 NOTE — Progress Notes (Signed)
  Radiation Oncology         [phone] ________________________________  Name: Stephanie Combs MRN: [id number]  Date: 01/06/2019  DOB: [birth year]  End of Treatment Note  Diagnosis:  Lung cancer     Indication for treatment::  curative       Radiation treatment dates:   11/24/18 - 01/06/19  Site/dose:   The patient was treated to the disease within the left lung initially to a dose of 60 Gy using a 4 field, 3-D conformal technique. The patient then received a cone down boost treatment for an additional 6 Gy. This yielded a final total dose of 66 Gy.   Narrative: The patient tolerated radiation treatment relatively well.   The patient did experience esophagitis during the course of treatment which required management.   Plan: The patient has completed radiation treatment. The patient will return to radiation oncology clinic for routine followup in one month. I advised the patient to call or return sooner if they have any questions or concerns related to their recovery or treatment. The patient is interested in PCI at the appropriate time.   ________________________________  Jodelle Gross, M.D., Ph.D.

## 2019-02-04 NOTE — Patient Outreach (Signed)
[place] [facility]) Care Management  02/04/2019  Stephanie Combs [birth year] [id number]    [name] left a voice message acknowledging receipt of the outreach call. Will follow-up next week.       [facility] [phone]

## 2019-02-04 NOTE — Patient Outreach (Signed)
[place] [facility]) Care Management  02/04/2019  Stephanie Combs [birth year] [id number]    Attempted outreach with [name]. Left voice message requesting a return call.   PLAN -Will follow-up within 3-4 business days.   [facility] [phone]

## 2019-02-07 ENCOUNTER — Telehealth: Payer: Self-pay

## 2019-02-07 NOTE — Telephone Encounter (Signed)
TC to pt per Dr Burr Medico  To let her know her lab results, TSH low, her levothyroxine may need ajustment by her PCP. I faxed over her lab results to her PCP [name] MD fax# [phone]. Fax log complete @[phone] 

## 2019-02-08 ENCOUNTER — Telehealth: Payer: Self-pay | Admitting: Radiation Oncology

## 2019-02-08 DIAGNOSIS — C3490 Malignant neoplasm of unspecified part of unspecified bronchus or lung: Secondary | ICD-10-CM

## 2019-02-08 NOTE — Telephone Encounter (Signed)
I called and spoke with the patient to let her know the recommendation was to proceed with a repeat MRI and anticipate PCI. We reviewed the risks, benefits, short and long term effects of treatment. After discussing risks of cognitive deficits, she declines Namenda. A new MRI is ordered to update her staging, and we will plan to simulate on 10/28 while she is here already to see Dr. Burr Medico.

## 2019-02-10 DIAGNOSIS — L98492 Non-pressure chronic ulcer of skin of other sites with fat layer exposed: Secondary | ICD-10-CM | POA: Diagnosis not present

## 2019-02-10 DIAGNOSIS — S31109A Unspecified open wound of abdominal wall, unspecified quadrant without penetration into peritoneal cavity, initial encounter: Secondary | ICD-10-CM | POA: Diagnosis not present

## 2019-02-11 ENCOUNTER — Other Ambulatory Visit: Payer: Self-pay

## 2019-02-11 NOTE — Patient Outreach (Signed)
[place] [facility]) Care Management  02/11/2019  Stephanie Combs 10/07/1950 [id number]    Successful outreach with Ms. Nhem. She remains compliant with medications and treatment recommendations. She reports a productive cough. Describes sputum as "clear to white." She denies complaints of shortness of breath and reports using supplemental oxygen as needed. Denies pain or chest discomfort. Denies fevers. Reports outreach with her primary care provider within the last week.  Ms. Costello denies urgent concerns or changes in care management needs. Agreeable to follow-up and reassessment within the next month. Will consider transition to [facility] if her condition remains stable. Current Outpatient Medications on File Prior to Visit  Medication Sig Dispense Refill  . albuterol (PROAIR HFA) 108 (90 Base) MCG/ACT inhaler Inhale 2 puffs into the lungs every 4 (four) hours as needed for wheezing or shortness of breath. For shortness of breath 18 g 0  . albuterol (PROVENTIL) (2.5 MG/3ML) 0.083% nebulizer solution Take 2.5 mg by nebulization 2 (two) times daily as needed for wheezing or shortness of breath.     . allopurinol (ZYLOPRIM) 300 MG tablet Take 1 tablet (300 mg total) by mouth daily for 15 days. 30 tablet 3  . allopurinol (ZYLOPRIM) 300 MG tablet Take 300 mg by mouth daily.    Marland Kitchen ALPRAZolam (XANAX) 1 MG tablet Take 1 mg by mouth 3 (three) times daily as needed for anxiety or sleep.     Marland Kitchen aspirin EC 81 MG tablet Take 81 mg by mouth daily.    Marland Kitchen atorvastatin (LIPITOR) 80 MG tablet Take 1 tablet (80 mg total) by mouth daily at 6 PM. 30 tablet 0  . cholecalciferol (VITAMIN D3) 25 MCG (1000 UT) tablet Take 5,000 Units by mouth daily.    . diphenoxylate-atropine (LOMOTIL) 2.5-0.025 MG tablet Take 1 tablet by mouth 4 (four) times daily as needed for diarrhea or loose stools.     . furosemide (LASIX) 20 MG tablet Take 20 mg by mouth every evening.    . furosemide (LASIX) 40 MG tablet Take  60 mg (1 1/2 tabs) in the morning and 40 mg (1 tab) in the afternoon (Patient taking differently: Take 40 mg by mouth daily. ) 75 tablet 6  . gabapentin (NEURONTIN) 600 MG tablet Take 600 mg by mouth 3 (three) times daily.     . hydrocortisone (ANUSOL-HC) 2.5 % rectal cream Place 1 application rectally 4 (four) times daily as needed for hemorrhoids.     . Hydrocortisone (GERHARDT'S BUTT CREAM) CREA Apply 1 application topically 4 (four) times daily as needed for irritation. 1 each 3  . insulin aspart (NOVOLOG) 100 UNIT/ML injection Inject 1-18 Units into the skin 3 (three) times daily with meals.    . insulin detemir (LEVEMIR) 100 UNIT/ML injection Inject 0.4 mLs (40 Units total) into the skin at bedtime. 10 mL 0  . insulin lispro (HUMALOG) 100 UNIT/ML injection Inject 0-20 Units into the skin 3 (three) times daily with meals.     Marland Kitchen levothyroxine (SYNTHROID, LEVOTHROID) 200 MCG tablet Take 1 tablet by mouth daily before breakfast.     . nitroGLYCERIN (NITROSTAT) 0.4 MG SL tablet Place 0.4 mg under the tongue every 5 (five) minutes as needed for chest pain.     Marland Kitchen nystatin (MYCOSTATIN/NYSTOP) powder Apply topically 2 (two) times daily. 15 g 0  . Omega-3 Fatty Acids (FISH OIL PO) Take 1 capsule by mouth daily.     . ondansetron (ZOFRAN) 8 MG tablet Take 1 tablet (8 mg total) by  mouth every 8 (eight) hours as needed for nausea or vomiting. 120 tablet 2  . ondansetron (ZOFRAN-ODT) 8 MG disintegrating tablet Take 1 tablet (8 mg total) by mouth every 8 (eight) hours as needed for nausea or vomiting. 30 tablet 1  . oxyCODONE-acetaminophen (PERCOCET) 10-325 MG tablet Take 1 tablet by mouth every 6 (six) hours as needed for pain.    . OXYGEN Inhale 2 L into the lungs continuous.    . pantoprazole (PROTONIX) 40 MG tablet Take 1 tablet (40 mg total) by mouth 2 (two) times daily. 180 tablet 3  . polyethylene glycol (MIRALAX / GLYCOLAX) 17 g packet Take 17 g by mouth 2 (two) times daily. 14 each 0  . potassium  chloride (MICRO-K) 10 MEQ CR capsule Take 2 capsules (20 mEq total) by mouth 2 (two) times daily. *May take one additional tablet as needed for cramping 120 capsule 3  . potassium chloride SA (K-DUR) 20 MEQ tablet Take 20 mEq by mouth 2 (two) times daily.    . prochlorperazine (COMPAZINE) 10 MG tablet Take 1 tablet (10 mg total) by mouth every 6 (six) hours as needed for nausea or vomiting. 30 tablet 0  . promethazine (PHENERGAN) 12.5 MG tablet Take 1 tablet (12.5 mg total) by mouth every 6 (six) hours as needed for nausea or vomiting. 45 tablet 1  . senna (SENOKOT) 8.6 MG TABS tablet Take 2 tablets (17.2 mg total) by mouth 2 (two) times daily with a meal. 120 each 0  . silver sulfADIAZINE (SILVADENE) 1 % cream Apply 1 application topically 2 (two) times daily.     . sucralfate (CARAFATE) 1 g tablet Take 1 tablet (1 g total) by mouth 4 (four) times daily -  with meals and at bedtime. 5 min before meals for radiation induced esophagitis 120 tablet 2  . SURE COMFORT INS SYR 1CC/28G 28G X 1/2" 1 ML MISC USE TO INJECT INSULIN UP TO 4 TIMES DAILY. 150 each 5  . tiZANidine (ZANAFLEX) 4 MG tablet Take 1 tablet (4 mg total) by mouth every 8 (eight) hours as needed for muscle spasms. 30 tablet 0  . topiramate (TOPAMAX) 25 MG tablet Take 75 mg by mouth at bedtime.     Marland Kitchen umeclidinium-vilanterol (ANORO ELLIPTA) 62.5-25 MCG/INH AEPB Inhale 1 puff into the lungs daily. 30 each 0   No current facility-administered medications on file prior to visit.     PLAN -Will follow-up within the next month.   [place] [phone]

## 2019-02-13 DIAGNOSIS — J449 Chronic obstructive pulmonary disease, unspecified: Secondary | ICD-10-CM | POA: Diagnosis not present

## 2019-02-22 ENCOUNTER — Other Ambulatory Visit: Payer: Self-pay | Admitting: Nurse Practitioner

## 2019-02-22 DIAGNOSIS — C3412 Malignant neoplasm of upper lobe, left bronchus or lung: Secondary | ICD-10-CM

## 2019-02-22 MED ORDER — PROMETHAZINE HCL 12.5 MG PO TABS: 12.5000 mg | ORAL_TABLET | Freq: Four times a day (QID) | ORAL | 1 refills | Status: AC | PRN

## 2019-02-22 MED ORDER — NYSTATIN 100000 UNIT/GM EX POWD: Freq: Two times a day (BID) | CUTANEOUS | 0 refills | Status: DC

## 2019-02-24 ENCOUNTER — Other Ambulatory Visit: Payer: Self-pay | Admitting: Radiation Therapy

## 2019-02-25 DIAGNOSIS — E1165 Type 2 diabetes mellitus with hyperglycemia: Secondary | ICD-10-CM | POA: Diagnosis not present

## 2019-02-28 NOTE — Progress Notes (Signed)
[place]   [phone] [phone]   Clinic Follow up Note   Patient Care Team: [name], MD as PCP - General (Internal Medicine) Herminio Commons, MD as PCP - Cardiology (Cardiology) Neldon Labella, RN as [place] Management Truitt Merle, MD as Consulting Physician (Hematology) Phillips Odor, MD as Consulting Physician (Neurology)  Date of Service:  03/09/2019  CHIEF COMPLAINT: F/u Small cell lung cancer and colon cancer   SUMMARY OF ONCOLOGIC HISTORY: Oncology History Overview Note  Cancer Staging Primary cancer of hepatic flexure of colon [facility]) Staging form: Colon and Rectum, AJCC 8th Edition - Clinical stage from 09/09/2018: Stage Unknown (cTX, cN0, cM0) - Signed by Truitt Merle, MD on 09/28/2018  Small cell lung cancer, left upper lobe ([place]) Staging form: Lung, AJCC 8th Edition - Clinical stage from 09/14/2018: Stage IIIB (cT3, cN2, cM0) - Signed by Truitt Merle, MD on 09/28/2018     Primary cancer of hepatic flexure of colon [facility])   Initial Diagnosis   Malignant neoplasm of hepatic flexure ([place])   09/09/2018 Procedure   Coloscopy by Dr Gala Romney 09/09/18  IMPRESSION -Colon mass as described in the vicinity of the hepatic flexure - status post biopsy and inking. Polyps associated with this tumor scattered all the way to the cecum were not removed; multiple splenic flexure and rectal polyps were removed as described above. Left-sided diverticulosis.  Upper endoscopy By Dr Gala Romney 09/09/18  IMPRESSION -Abnormal distal esophagus suspicious for Barrett's esophagus?biopsied. - Small hiatal hernia. - Erosive gastropathy. Biopsied. -Duodenal bulbar erosions   09/09/2018 Initial Biopsy   Diagnosis 09/09/18 1. Stomach, biopsy - GASTRIC ANTRAL MUCOSA WITH MILD NONSPECIFIC REACTIVE GASTROPATHY - GASTRIC OXYNTIC MUCOSA WITH PARIETAL CELL HYPERPLASIA AS CAN BE SEEN IN HYPERGASTRINEMIC STATES SUCH AS PROPER PATIENT IDENTIFICATION  THERAPY. - WARTHIN STARRY STAIN IS NEGATIVE FOR HELICOBACTER PYLORI 2. Esophagus, biopsy - BARRETT'S ESOPHAGUS, NEGATIVE FOR DYSPLASIA 3. Colon, polyp(s), descending - TUBULAR ADENOMA - NEGATIVE FOR HIGH-GRADE DYSPLASIA OR MALIGNANCY 4. Colon, biopsy, hepatic flexure - ADENOCARCINOMA. SEE NOTE 5. Colon, polyp(s), splenic flexure - ADENOCARCINOMA. SEE NOTE - OTHER FRAGMENTS OF TUBULAR ADENOMA(S) WITH ONE SHOWING HIGH-GRADE DYSPLASIA 6. Rectum, polyp(s) - TUBULAR ADENOMA(S) - NEGATIVE FOR HIGH-GRADE DYSPLASIA OR MALIGNANCY   09/09/2018 Cancer Staging   Staging form: Colon and Rectum, AJCC 8th Edition - Clinical stage from 09/09/2018: Stage Unknown (cTX, cN0, cM0) - Signed by Truitt Merle, MD on 09/28/2018   09/11/2018 Imaging   CT CAP 09/11/18  IMPRESSION: 1. Central perihilar left upper lobe 5.4 cm lung mass, new since 01/27/2017 chest CT, compatible with malignancy, favor primary bronchogenic carcinoma. Postobstructive pneumonia/atelectasis in the lingula. 2. Mediastinal adenopathy in the left prevascular/AP window and subcarinal stations, compatible with metastatic nodes (more likely originating from the left upper lobe lung mass). 3. Trace dependent left pleural effusion. 4. Apple-core lesion in the ascending colon, compatible with known primary colonic neoplasm. Adjacent top-normal size right mesenteric lymph node is equivocal for locoregional nodal metastasis. No additional potential findings of metastatic disease in the abdomen or pelvis. 5. Chronic findings include: Aortic Atherosclerosis (ICD10-I70.0). Borderline mild cardiomegaly. One vessel coronary atherosclerosis. Dilated main pulmonary artery, suggesting pulmonary arterial hypertension. Left adrenal adenoma. Punctate bilateral nephrolithiasis. Small hiatal hernia.   09/20/2018 Imaging   Brain MRI 09/20/18  IMPRESSION: 1. Motion degraded examination without brain metastases identified. 2. Small skull metastases not  excluded. 3. Moderate to severe chronic small vessel ischemic disease.   Small cell lung cancer, left upper lobe (  [place])  09/11/2018 Imaging   CT CAP 09/11/18  IMPRESSION: 1. Central perihilar left upper lobe 5.4 cm lung mass, new since 01/27/2017 chest CT, compatible with malignancy, favor primary bronchogenic carcinoma. Postobstructive pneumonia/atelectasis in the lingula. 2. Mediastinal adenopathy in the left prevascular/AP window and subcarinal stations, compatible with metastatic nodes (more likely originating from the left upper lobe lung mass). 3. Trace dependent left pleural effusion. 4. Apple-core lesion in the ascending colon, compatible with known primary colonic neoplasm. Adjacent top-normal size right mesenteric lymph node is equivocal for locoregional nodal metastasis. No additional potential findings of metastatic disease in the abdomen or pelvis. 5. Chronic findings include: Aortic Atherosclerosis (ICD10-I70.0). Borderline mild cardiomegaly. One vessel coronary atherosclerosis. Dilated main pulmonary artery, suggesting pulmonary arterial hypertension. Left adrenal adenoma. Punctate bilateral nephrolithiasis. Small hiatal hernia.    09/14/2018 Initial Biopsy   Diagnosis 5/5.20 Lung, biopsy, LUL - SMALL CELL CARCINOMA.   09/14/2018 Cancer Staging   Staging form: Lung, AJCC 8th Edition - Clinical stage from 09/14/2018: Stage IIIB (cT3, cN2, cM0) - Signed by Truitt Merle, MD on 09/28/2018   09/15/2018 Initial Diagnosis   Small cell lung cancer, left upper lobe ([place])   09/18/2018 - 12/17/2018 Chemotherapy   Carboplatin and etoposide every 3 weeks for 4 cycles starting 09/18/18. Held after C2D1 due to COVID-19(+). She restarted with cycle 3 on 11/03/18. Completed 5 cycles on 12/17/18.  RT was concurrnet but started on 11/24/18.   09/20/2018 Imaging   Brain MRI 09/20/18  IMPRESSION: 1. Motion degraded examination without brain metastases identified. 2. Small skull metastases not excluded.  3. Moderate to severe chronic small vessel ischemic disease.    11/24/2018 - 01/06/2019 Radiation Therapy    RT was postponed to 11/24/18. Only concurrent with chemo for 2 cycles. Completed on 01/06/19   03/03/2019 Imaging   MRI Brain  IMPRESSION: Interval development of 3 metastatic deposits in the brain as above   Atrophy and moderate chronic microvascular ischemic changes in the white matter.      CURRENT THERAPY:  Observation  INTERVAL HISTORY:  Stephanie Combs is here for a follow up and Small cell lung cancer and colon cancer. She presents to the clinic alone. She notes she has been having migraine pain which is related to her brain mets. She has spoken to PA [employer] and is interested in target radiation. She notes she does not want to be on steroids but for pain. She notes she would like to increase her Xanax back to TID to help her anxiety. It was reduced by pain clinic previously. She notes a lot of cancer loss in her family recently. She notes she has chest congestion. She notes after taking break from oxygen she can feel chest soreness which she attributes to harder breathing. She denies fever, chills and no cough with phlegm.  She notes her sugar was low mid last night and ate sugar candy and ended up eating a whole pack. Her sugar is high today. She notes dry itching scalp with her hair growing back. She is looking for refill.     REVIEW OF SYSTEMS:   Constitutional: Denies fevers, chills or abnormal weight loss (+) Migraines Eyes: Denies blurriness of vision Ears, nose, mouth, throat, and face: Denies mucositis or sore throat Respiratory: Denies cough, dyspnea or wheezes (+) Chest congestion Cardiovascular: Denies palpitation, chest discomfort or lower extremity swelling Gastrointestinal:  Denies nausea, heartburn or change in bowel habits Skin: Denies abnormal skin rashes Lymphatics: Denies new lymphadenopathy or easy  bruising Neurological:Denies numbness,  tingling or new weaknesses Behavioral/Psych: Mood is stable, no new changes (+) Anxious All other systems were reviewed with the patient and are negative.  MEDICAL HISTORY:  Past Medical History:  Diagnosis Date  . Anxiety   . CAD (coronary artery disease)    a. s/p DES to LAD and angioplasty to D1 in 05/2017  . CHF (congestive heart failure) ([facility])    Diastolic  . Chronic back pain   . Chronic pain   . COPD (chronic obstructive pulmonary disease) ([place])   . Degenerative disk disease   . Diabetes mellitus without complication ([place])   . History of medication noncompliance 11/2017   "the patient frequently self-adjusts medications without notifying her physicians"  . Hypertension   . Hypothyroidism   . On home O2    2.5 L N/C prn  . Pedal edema     SURGICAL HISTORY: Past Surgical History:  Procedure Laterality Date  . BIOPSY  09/09/2018   Procedure: BIOPSY;  Surgeon: Daneil Dolin, MD;  [facility];  Service: Endoscopy;;  gastric esophageal hepatic flexure colon  . COLONOSCOPY WITH PROPOFOL N/A 09/09/2018   Procedure: COLONOSCOPY WITH PROPOFOL;  Surgeon: Daneil Dolin, MD;  [facility];  Service: Endoscopy;  Laterality: N/A;  . CORONARY STENT INTERVENTION N/A 05/22/2017   Procedure: CORONARY STENT INTERVENTION;  Surgeon: Martinique, Peter M, MD;  Location: [facility];  Service: Cardiovascular;  Laterality: N/A;  . ESOPHAGOGASTRODUODENOSCOPY (EGD) WITH PROPOFOL N/A 09/09/2018   Procedure: ESOPHAGOGASTRODUODENOSCOPY (EGD) WITH PROPOFOL;  Surgeon: Daneil Dolin, MD;  [facility];  Service: Endoscopy;  Laterality: N/A;  . HERNIA REPAIR    . POLYPECTOMY  09/09/2018   Procedure: POLYPECTOMY;  Surgeon: Daneil Dolin, MD;  [facility];  Service: Endoscopy;;  colon  . RIGHT/LEFT HEART CATH AND CORONARY ANGIOGRAPHY N/A 05/19/2017   Procedure: RIGHT/LEFT HEART CATH AND CORONARY ANGIOGRAPHY;  Surgeon: Leonie Man, MD;  Location: [facility];  Service: Cardiovascular;  Laterality: N/A;  . TUBAL LIGATION    . VIDEO BRONCHOSCOPY WITH ENDOBRONCHIAL ULTRASOUND Left 09/14/2018   Procedure: VIDEO BRONCHOSCOPY WITH ENDOBRONCHIAL ULTRASOUND with biopsies;  Surgeon: Margaretha Seeds, MD;  Location: [facility];  Service: Thoracic;  Laterality: Left;    I have reviewed the social history and family history with the patient and they are unchanged from previous note.  ALLERGIES:  is allergic to dilaudid [hydromorphone hcl]; midodrine hcl; actifed cold-allergy [chlorpheniramine-phenylephrine]; doxycycline; other; penicillins; reglan [metoclopramide]; valium [diazepam]; and vistaril [hydroxyzine hcl].  MEDICATIONS:  Current Outpatient Medications  Medication Sig Dispense Refill  . acetaminophen-codeine (TYLENOL #3) 300-30 MG tablet Take 1 tablet by mouth every 6 (six) hours as needed for moderate pain. 30 tablet 0  . albuterol (PROAIR HFA) 108 (90 Base) MCG/ACT inhaler Inhale 2 puffs into the lungs every 4 (four) hours as needed for wheezing or shortness of breath. For shortness of breath 18 g 0  . albuterol (PROVENTIL) (2.5 MG/3ML) 0.083% nebulizer solution Take 2.5 mg by nebulization 2 (two) times daily as needed for wheezing or shortness of breath.     . allopurinol (ZYLOPRIM) 300 MG tablet Take 300 mg by mouth daily.    Marland Kitchen ALPRAZolam (XANAX) 1 MG tablet Take 1 mg by mouth 3 (three) times daily as needed for anxiety or sleep.     Marland Kitchen aspirin EC 81 MG tablet Take 81 mg by mouth daily.    Marland Kitchen atorvastatin (LIPITOR)  80 MG tablet Take 1 tablet (80 mg total) by mouth daily at 6 PM. 30 tablet 0  . cholecalciferol (VITAMIN D3) 25 MCG (1000 UT) tablet Take 5,000 Units by mouth daily.    . diphenoxylate-atropine (LOMOTIL) 2.5-0.025 MG tablet Take 1 tablet by mouth 4 (four) times daily as needed for diarrhea or loose stools.     . furosemide (LASIX) 20 MG tablet Take 20 mg by mouth every evening.    . furosemide (LASIX) 40 MG tablet Take 60 mg (1  1/2 tabs) in the morning and 40 mg (1 tab) in the afternoon (Patient taking differently: Take 40 mg by mouth daily. ) 75 tablet 6  . gabapentin (NEURONTIN) 600 MG tablet Take 600 mg by mouth 3 (three) times daily.     . hydrocortisone (ANUSOL-HC) 2.5 % rectal cream Place 1 application rectally 4 (four) times daily as needed for hemorrhoids.     . Hydrocortisone (GERHARDT'S BUTT CREAM) CREA Apply 1 application topically 4 (four) times daily as needed for irritation. 1 each 3  . insulin aspart (NOVOLOG) 100 UNIT/ML injection Inject 1-18 Units into the skin 3 (three) times daily with meals.    . insulin detemir (LEVEMIR) 100 UNIT/ML injection Inject 0.4 mLs (40 Units total) into the skin at bedtime. 10 mL 0  . insulin lispro (HUMALOG) 100 UNIT/ML injection Inject 0-20 Units into the skin 3 (three) times daily with meals.     Marland Kitchen levothyroxine (SYNTHROID, LEVOTHROID) 200 MCG tablet Take 1 tablet by mouth daily before breakfast.     . nitroGLYCERIN (NITROSTAT) 0.4 MG SL tablet Place 0.4 mg under the tongue every 5 (five) minutes as needed for chest pain.     Marland Kitchen nystatin (MYCOSTATIN/NYSTOP) powder Apply topically 2 (two) times daily. 30 g 0  . Omega-3 Fatty Acids (FISH OIL PO) Take 1 capsule by mouth daily.     . ondansetron (ZOFRAN) 8 MG tablet Take 1 tablet (8 mg total) by mouth every 8 (eight) hours as needed for nausea or vomiting. 120 tablet 2  . ondansetron (ZOFRAN-ODT) 8 MG disintegrating tablet TAKE 1 TABLET BY MOUTH EVERY 8 HOURS AS NEEDED FOR NAUSEA OR VOMITING. 30 tablet 0  . oxyCODONE-acetaminophen (PERCOCET) 10-325 MG tablet Take 1 tablet by mouth every 6 (six) hours as needed for pain.    . OXYGEN Inhale 2 L into the lungs continuous.    . pantoprazole (PROTONIX) 40 MG tablet Take 1 tablet (40 mg total) by mouth 2 (two) times daily. 180 tablet 3  . polyethylene glycol (MIRALAX / GLYCOLAX) 17 g packet Take 17 g by mouth 2 (two) times daily. 14 each 0  . potassium chloride (MICRO-K) 10 MEQ CR  capsule Take 2 capsules (20 mEq total) by mouth 2 (two) times daily. *May take one additional tablet as needed for cramping 120 capsule 3  . prochlorperazine (COMPAZINE) 10 MG tablet Take 1 tablet (10 mg total) by mouth every 6 (six) hours as needed for nausea or vomiting. 30 tablet 0  . promethazine (PHENERGAN) 12.5 MG tablet Take 1 tablet (12.5 mg total) by mouth every 6 (six) hours as needed for nausea or vomiting. 45 tablet 1  . senna (SENOKOT) 8.6 MG TABS tablet Take 2 tablets (17.2 mg total) by mouth 2 (two) times daily with a meal. 120 each 0  . silver sulfADIAZINE (SILVADENE) 1 % cream Apply 1 application topically 2 (two) times daily.     . sucralfate (CARAFATE) 1 g tablet Take 1 tablet (1  g total) by mouth 4 (four) times daily -  with meals and at bedtime. 5 min before meals for radiation induced esophagitis 120 tablet 2  . SURE COMFORT INS SYR 1CC/28G 28G X 1/2" 1 ML MISC USE TO INJECT INSULIN UP TO 4 TIMES DAILY. 150 each 5  . tiZANidine (ZANAFLEX) 4 MG tablet Take 1 tablet (4 mg total) by mouth every 8 (eight) hours as needed for muscle spasms. 30 tablet 0  . topiramate (TOPAMAX) 25 MG tablet Take 75 mg by mouth at bedtime.     Marland Kitchen umeclidinium-vilanterol (ANORO ELLIPTA) 62.5-25 MCG/INH AEPB Inhale 1 puff into the lungs daily. 30 each 0  . allopurinol (ZYLOPRIM) 300 MG tablet Take 1 tablet (300 mg total) by mouth daily for 15 days. 30 tablet 3  . diphenhydrAMINE-zinc acetate (BENADRYL) cream Apply 1 application topically 3 (three) times daily as needed for itching. 28.4 g 2  . potassium chloride SA (KLOR-CON) 20 MEQ tablet Take 1 tablet (20 mEq total) by mouth 2 (two) times daily. 60 tablet 1   No current facility-administered medications for this visit.     PHYSICAL EXAMINATION: ECOG PERFORMANCE STATUS: 3 - Symptomatic, >50% confined to bed  Vitals:   03/09/19 1329  BP: (!) 141/82  Pulse: 81  Resp: 17  Temp: 98.3 F (36.8 C)  SpO2: 93%   Filed Weights   03/09/19 1329   Weight: 284 lb 3.2 oz (128.9 kg)    GENERAL:alert, no distress and comfortable, morbidly obese lady in wheelchair SKIN: skin color, texture, turgor are normal, no rashes or significant lesions EYES: normal, Conjunctiva are pink and non-injected, sclera clear  NECK: supple, thyroid normal size, non-tender, without nodularity LYMPH:  no palpable lymphadenopathy in the cervical, axillary  LUNGS: clear to auscultation and percussion (+) Mild wheezing  HEART: regular rate & rhythm and no murmurs and no lower extremity edema ABDOMEN:abdomen soft, non-tender and normal bowel sounds Musculoskeletal:no cyanosis of digits and no clubbing, bilateral lower extremity edema with skin thickening and some pigmentation NEURO: alert & oriented x 3 with fluent speech, no focal motor/sensory deficits  LABORATORY DATA:  I have reviewed the data as listed CBC Latest Ref Rng & Units 03/09/2019 02/03/2019 01/22/2019  WBC 4.0 - 10.5 K/uL 5.3 5.9 5.2  Hemoglobin 12.0 - 15.0 g/dL 9.3(L) 9.1(L) 8.8(L)  Hematocrit 36.0 - 46.0 % 29.3(L) 28.6(L) 28.7(L)  Platelets 150 - 400 K/uL 211 175 224     CMP Latest Ref Rng & Units 03/09/2019 02/03/2019 01/22/2019  Glucose 70 - 99 mg/dL 310(H) 206(H) 82  BUN 8 - 23 mg/dL 28(H) 34(H) 31(H)  Creatinine 0.44 - 1.00 mg/dL 1.44(H) 1.62(H) 2.42(H)  Sodium 135 - 145 mmol/L 141 138 137  Potassium 3.5 - 5.1 mmol/L 5.0 5.3(H) 4.3  Chloride 98 - 111 mmol/L 103 102 101  CO2 22 - 32 mmol/L _0 Calcium 8.9 - 10.3 mg/dL 8.7(L) 9.0 8.4(L)  Total Protein 6.5 - 8.1 g/dL 6.1(L) 6.2(L) 6.4(L)  Total Bilirubin 0.3 - 1.2 mg/dL <0.2(L) 0.4 0.1(L)  Alkaline Phos 38 - 126 U/L 103 103 89  AST 15 - 41 U/L 9(L) 10(L) 15  ALT 0 - 44 U/L 7 <6 8      RADIOGRAPHIC STUDIES: I have personally reviewed the radiological images as listed and agreed with the findings in the report. No results found.   ASSESSMENT & PLAN:  Stephanie Combs is a 68 y.o. female with   1. Small Cell Lung cancer,  LUL,  cT3N2M0, new brain metastasis in 02/2019 -Newly diagnosedin 5/2020during her work up for colon cancer -She has completed concurrent chemo and radiation, with significant chemo dose reduction.She completed radiation therapy on 01/06/19.She overall tolerated both treatment moderately well -I personally reviewed and discussed her MRI brain from 03/03/19 which unfortunately shows interval development of 3 metastatic deposits in the brain.  -This type of progression is common from aggressive Small cell lung cancer. I discussed with brain metastasis her cancer is stage IV and is not curable. This is still treatable.  -Patient was devastated with a newly diagnosed brain metastasis, very anxious, depressed, cried for a while during her visit today.  I gave her emotional support, and called our chaplain to talk to her today  -I discussed the next step in treatment is brain radiation for cancer control I briefly reviewed side effects. She agreed to proceed with simulation today. Plan to start next week.  -She does have new onset headaches, I recommend dexamethasone, she declined.  She is concerned about side effects, especially hypoglycemia. -After RT she will proceed we will discuss systemic treatment to control her cancers. Goal of care is palliative at this point. She understands.  -Other than recent headaches from disease progression she is stable with stable performance status. Lab reviewed, CBC and CMP WNL except Hg 9.3, BG 310, BUN 28, Cr 1.44, Ca 8.7, Protein 6.1, albumin 2.9.  -F/u in 3 weeks. I encouraged her to contact clinic sooner if she has any concerns.    2. 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  -Unfortunately she is not a candidate for colon cancer surgery due to very high risk ofsurgical mortality.She was seen by surgery during her recent hospitalization.   -I discussed the incurablenature of her colon cancer without surgery. -Willcontinueto monitor on current treatment for her SCLC. -She is recovering well from chemoRT for SLCL. If she proceeds and does well with brain radiation and next scan I will consider low intensity chemo such as Xeloda or Irinotecan (which works for [employer] also). Given normal MMR, she is not a candidate for immunotherapy. Given primary right side colon cancer, not much benefit from EGFR inhibitors even if KRAS/NRAS/BRAF wild type. -I discussed treatment with chemo would be palliative to control her disease. She would continue for as long as she can tolerate she progresses.She understands. This will be postponed given her recent brain metastasis.   3. 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 -anemia slightly worse due to chemo -Continue monitoring -Last bloodtransfusionon 01/05/19  4. CAD, CHF, HTN, significant LE edema, CKD -OnLipitor, lasix, nitro -Continue to f/u with cardiologist. -Her CHFand fluid overload has muchimproved during her recent hospitalization. -She has been on '40mg'$  Lasix in the morning.She responded well to iv lasixwhich I give during his chemo -Ipreviouslyheld her oral lasix and gave iv lasix'40mg'$ dailyduring chemowhich she responded very well. With completion of chemo she takes alternating Lasix doseLasix '40mg'$  in the AM and '20mg'$  in the PM. Continue -LE still improvingslowly, R LE is larger than L.   5.DM,hypothyroidism, morbid obesity -On insulin novolog and long acting levemir. OnLevothyroxine. -Willmonitor glucose closely during her chemo treatment -Per patient her BG was low last night so she took candy but a whole pack. BG at 310 today (03/09/19). She will take insulin when she goes home.   6.Rheumatoidarthritis, chronic back pain  -On Percocet  which  she takes 4 times a day.  -She is also hasGabapentin.  -She use to receiveinjectionfor her back -Pain chronic, stable. She will continue to see her RA and Dr. Phillips Odor pain specialist in [place].   7. Depression and anxiety -She has been more depressed with recent diagnosis of 2 cancers -She is currently on Celebrex, will continue. -stable  -She is on Xanax to help her sleep and her anxiety. This is managed by Dr. Merlene Laughter and she wants to request dose increase. I will send message to Dr. Merlene Laughter about increased her Xanax back to TID given her recent disease progression and recent family losses.  -I offered her the chance to speak with out chaplain today.   8.Goal of care discussion, DNR/DNI -We previously discussed the incurable nature of her cancer, and the overall poor prognosis, especially ifshe does not have good response to chemotherapy or progress on chemo -The patient understands the goal of care is palliative. -I recommend DNR/DNI,she has agreed to DRI/DNR.She has written will and has established her daughter as her POA.  9.History ofCOVID-19 -She was incidentally found to be positive for COVID-19on 10/11/2018, no respiratory symptoms or evidence of pneumonia,she hasGIsymptoms which could be relate -Her testing on 11/04/18 was positive again. -no COVID symptoms  10. Chest Congestion -She denied fever, chills and no neutropenia.  -She does have mild wheezing on exam today. Given her high blood sugar, she declined oral prednisone.  -I encouraged her to continue to use inhaler and oxygen as needed.    Plan: -she is very anxious and stressed due to the newly diagnosed brain metastasis, I called our chaplain today -I refilled her benadryl cream. Will look into inpatient buttock cream for her.  -Proceed with Brain Radiation Next week.  -Lab and f/u in 3 weeks    No problem-specific Assessment & Plan notes found for this encounter.   No orders of  the defined types were placed in this encounter.  All questions were answered. The patient knows to call the clinic with any problems, questions or concerns. No barriers to learning was detected. I spent 30 minutes counseling the patient face to face. The total time spent in the appointment was 40 minutes and more than 50% was on counseling and review of test results     Truitt Merle, MD 03/09/2019   I, Joslyn Devon, am acting as scribe for Truitt Merle, MD.   I have reviewed the above documentation for accuracy and completeness, and I agree with the above.

## 2019-03-03 ENCOUNTER — Other Ambulatory Visit: Payer: Self-pay

## 2019-03-03 ENCOUNTER — Ambulatory Visit (HOSPITAL_COMMUNITY)
Admission: RE | Admit: 2019-03-03 | Discharge: 2019-03-03 | Disposition: A | Discharge status: Home / Self Care | Payer: Medicare Other | Source: Ambulatory Visit | Attending: Radiation Oncology | Admitting: Radiation Oncology

## 2019-03-03 DIAGNOSIS — C3490 Malignant neoplasm of unspecified part of unspecified bronchus or lung: Secondary | ICD-10-CM

## 2019-03-03 DIAGNOSIS — C7931 Secondary malignant neoplasm of brain: Secondary | ICD-10-CM | POA: Diagnosis not present

## 2019-03-03 MED ORDER — GADOBUTROL 1 MMOL/ML IV SOLN
10.0000 mL | Freq: Once | INTRAVENOUS | Status: AC | PRN
  Administered 2019-03-03: 10 mL via INTRAVENOUS

## 2019-03-07 ENCOUNTER — Telehealth: Payer: Self-pay | Admitting: Radiation Oncology

## 2019-03-07 MED ORDER — ACETAMINOPHEN-CODEINE #3 300-30 MG PO TABS: 1.0000 | ORAL_TABLET | Freq: Four times a day (QID) | ORAL | 0 refills | Status: DC | PRN

## 2019-03-07 NOTE — Telephone Encounter (Signed)
I called and spoke with the patient to let her know she has three lesions in the brain and we would propose whole brain radiotherapy. She is having headaches that started two days ago. We discussed options for steroids but she declines. She has been trying tylenol prn without relief. She requested a trial of tylenol #3 as these have helped in the past and she reports she does not have any allergic reaction to this. I let her know that the previous headaches were not related to malignancy but that I was willing to try as she is averse to steroids. If this does not help however we discussed starting steroids and will discuss on Wednesday at simulation.

## 2019-03-08 ENCOUNTER — Inpatient Hospital Stay: Payer: Medicare Other

## 2019-03-09 ENCOUNTER — Other Ambulatory Visit: Payer: Self-pay

## 2019-03-09 ENCOUNTER — Inpatient Hospital Stay: Payer: Medicare Other | Attending: Hematology

## 2019-03-09 ENCOUNTER — Inpatient Hospital Stay (HOSPITAL_BASED_OUTPATIENT_CLINIC_OR_DEPARTMENT_OTHER): Payer: Medicare Other | Admitting: Hematology

## 2019-03-09 ENCOUNTER — Encounter: Payer: Self-pay | Admitting: General Practice

## 2019-03-09 ENCOUNTER — Encounter: Payer: Self-pay | Admitting: Hematology

## 2019-03-09 ENCOUNTER — Telehealth: Payer: Self-pay

## 2019-03-09 ENCOUNTER — Ambulatory Visit
Admission: RE | Admit: 2019-03-09 | Discharge: 2019-03-09 | Disposition: A | Discharge status: Home / Self Care | Payer: Medicare Other | Source: Ambulatory Visit | Attending: Radiation Oncology | Admitting: Radiation Oncology

## 2019-03-09 VITALS — BP 141/82 | HR 81 | Temp 98.3°F | Resp 17 | Ht 67.0 in | Wt 284.2 lb

## 2019-03-09 DIAGNOSIS — Z79899 Other long term (current) drug therapy: Secondary | ICD-10-CM | POA: Diagnosis not present

## 2019-03-09 DIAGNOSIS — Z8619 Personal history of other infectious and parasitic diseases: Secondary | ICD-10-CM | POA: Insufficient documentation

## 2019-03-09 DIAGNOSIS — C183 Malignant neoplasm of hepatic flexure: Secondary | ICD-10-CM | POA: Insufficient documentation

## 2019-03-09 DIAGNOSIS — C19 Malignant neoplasm of rectosigmoid junction: Secondary | ICD-10-CM | POA: Diagnosis not present

## 2019-03-09 DIAGNOSIS — Z9221 Personal history of antineoplastic chemotherapy: Secondary | ICD-10-CM | POA: Insufficient documentation

## 2019-03-09 DIAGNOSIS — I110 Hypertensive heart disease with heart failure: Secondary | ICD-10-CM | POA: Insufficient documentation

## 2019-03-09 DIAGNOSIS — I509 Heart failure, unspecified: Secondary | ICD-10-CM | POA: Insufficient documentation

## 2019-03-09 DIAGNOSIS — I10 Essential (primary) hypertension: Secondary | ICD-10-CM | POA: Insufficient documentation

## 2019-03-09 DIAGNOSIS — E119 Type 2 diabetes mellitus without complications: Secondary | ICD-10-CM | POA: Diagnosis not present

## 2019-03-09 DIAGNOSIS — C7931 Secondary malignant neoplasm of brain: Secondary | ICD-10-CM

## 2019-03-09 DIAGNOSIS — C3412 Malignant neoplasm of upper lobe, left bronchus or lung: Secondary | ICD-10-CM

## 2019-03-09 DIAGNOSIS — Z923 Personal history of irradiation: Secondary | ICD-10-CM | POA: Diagnosis not present

## 2019-03-09 DIAGNOSIS — D508 Other iron deficiency anemias: Secondary | ICD-10-CM | POA: Diagnosis not present

## 2019-03-09 DIAGNOSIS — E1122 Type 2 diabetes mellitus with diabetic chronic kidney disease: Secondary | ICD-10-CM

## 2019-03-09 DIAGNOSIS — Z9981 Dependence on supplemental oxygen: Secondary | ICD-10-CM | POA: Insufficient documentation

## 2019-03-09 DIAGNOSIS — Z510 Encounter for antineoplastic radiation therapy: Secondary | ICD-10-CM | POA: Insufficient documentation

## 2019-03-09 DIAGNOSIS — C3490 Malignant neoplasm of unspecified part of unspecified bronchus or lung: Secondary | ICD-10-CM | POA: Insufficient documentation

## 2019-03-09 DIAGNOSIS — E039 Hypothyroidism, unspecified: Secondary | ICD-10-CM | POA: Diagnosis not present

## 2019-03-09 DIAGNOSIS — I5032 Chronic diastolic (congestive) heart failure: Secondary | ICD-10-CM

## 2019-03-09 DIAGNOSIS — N182 Chronic kidney disease, stage 2 (mild): Secondary | ICD-10-CM

## 2019-03-09 DIAGNOSIS — F418 Other specified anxiety disorders: Secondary | ICD-10-CM | POA: Insufficient documentation

## 2019-03-09 DIAGNOSIS — Z298 Encounter for other specified prophylactic measures: Secondary | ICD-10-CM | POA: Diagnosis not present

## 2019-03-09 DIAGNOSIS — M069 Rheumatoid arthritis, unspecified: Secondary | ICD-10-CM | POA: Diagnosis not present

## 2019-03-09 DIAGNOSIS — Z7982 Long term (current) use of aspirin: Secondary | ICD-10-CM | POA: Diagnosis not present

## 2019-03-09 DIAGNOSIS — I2510 Atherosclerotic heart disease of native coronary artery without angina pectoris: Secondary | ICD-10-CM | POA: Insufficient documentation

## 2019-03-09 DIAGNOSIS — Z794 Long term (current) use of insulin: Secondary | ICD-10-CM

## 2019-03-09 DIAGNOSIS — D509 Iron deficiency anemia, unspecified: Secondary | ICD-10-CM | POA: Diagnosis not present

## 2019-03-09 DIAGNOSIS — J449 Chronic obstructive pulmonary disease, unspecified: Secondary | ICD-10-CM | POA: Diagnosis not present

## 2019-03-09 LAB — CMP (CANCER CENTER ONLY)
ALT: 7 U/L (ref 0–44)
AST: 9 U/L — ABNORMAL LOW (ref 15–41)
Albumin: 2.9 g/dL — ABNORMAL LOW (ref 3.5–5.0)
Alkaline Phosphatase: 103 U/L (ref 38–126)
Anion gap: 12 (ref 5–15)
BUN: 28 mg/dL — ABNORMAL HIGH (ref 8–23)
CO2: 26 mmol/L (ref 22–32)
Calcium: 8.7 mg/dL — ABNORMAL LOW (ref 8.9–10.3)
Chloride: 103 mmol/L (ref 98–111)
Creatinine: 1.44 mg/dL — ABNORMAL HIGH (ref 0.44–1.00)
GFR, Est AFR Am: 43 mL/min — ABNORMAL LOW (ref >60)
GFR, Estimated: 37 mL/min — ABNORMAL LOW (ref >60)
Glucose, Bld: 310 mg/dL — ABNORMAL HIGH (ref 70–99)
Potassium: 5 mmol/L (ref 3.5–5.1)
Sodium: 141 mmol/L (ref 135–145)
Total Bilirubin: <0.2 mg/dL — ABNORMAL LOW (ref 0.3–1.2)
Total Protein: 6.1 g/dL — ABNORMAL LOW (ref 6.5–8.1)

## 2019-03-09 LAB — CBC WITH DIFFERENTIAL (CANCER CENTER ONLY)
Abs Immature Granulocytes: 0.03 10*3/uL (ref 0.00–0.07)
Basophils Absolute: 0.1 10*3/uL (ref 0.0–0.1)
Basophils Relative: 1 %
Eosinophils Absolute: 0.2 10*3/uL (ref 0.0–0.5)
Eosinophils Relative: 3 %
HCT: 29.3 % — ABNORMAL LOW (ref 36.0–46.0)
Hemoglobin: 9.3 g/dL — ABNORMAL LOW (ref 12.0–15.0)
Immature Granulocytes: 1 %
Lymphocytes Relative: 16 %
Lymphs Abs: 0.9 10*3/uL (ref 0.7–4.0)
MCH: 32 pg (ref 26.0–34.0)
MCHC: 31.7 g/dL (ref 30.0–36.0)
MCV: 100.7 fL — ABNORMAL HIGH (ref 80.0–100.0)
Monocytes Absolute: 0.5 10*3/uL (ref 0.1–1.0)
Monocytes Relative: 9 %
Neutro Abs: 3.7 10*3/uL (ref 1.7–7.7)
Neutrophils Relative %: 70 %
Platelet Count: 211 10*3/uL (ref 150–400)
RBC: 2.91 MIL/uL — ABNORMAL LOW (ref 3.87–5.11)
RDW: 13.9 % (ref 11.5–15.5)
WBC Count: 5.3 10*3/uL (ref 4.0–10.5)
nRBC: 0 % (ref 0.0–0.2)

## 2019-03-09 MED ORDER — POTASSIUM CHLORIDE CRYS ER 20 MEQ PO TBCR: 20.0000 meq | EXTENDED_RELEASE_TABLET | Freq: Two times a day (BID) | ORAL | 1 refills | Status: DC

## 2019-03-09 MED ORDER — DIPHENHYDRAMINE-ZINC ACETATE 2-0.1 % EX CREA: 1.0000 "application " | TOPICAL_CREAM | Freq: Three times a day (TID) | CUTANEOUS | 2 refills | Status: DC | PRN

## 2019-03-09 NOTE — Telephone Encounter (Signed)
[facility] to inquire if they carry butt cream and was told they do not have any in stock.

## 2019-03-09 NOTE — Telephone Encounter (Signed)
Spoke with [name] and they have in [place]. Will have one ready at the drive through for the patient.  I spoke to her to let her know.

## 2019-03-10 ENCOUNTER — Telehealth: Payer: Self-pay

## 2019-03-10 DIAGNOSIS — E119 Type 2 diabetes mellitus without complications: Secondary | ICD-10-CM | POA: Diagnosis not present

## 2019-03-10 DIAGNOSIS — D509 Iron deficiency anemia, unspecified: Secondary | ICD-10-CM | POA: Diagnosis not present

## 2019-03-10 DIAGNOSIS — I2510 Atherosclerotic heart disease of native coronary artery without angina pectoris: Secondary | ICD-10-CM | POA: Diagnosis not present

## 2019-03-10 DIAGNOSIS — I509 Heart failure, unspecified: Secondary | ICD-10-CM | POA: Diagnosis not present

## 2019-03-10 DIAGNOSIS — C19 Malignant neoplasm of rectosigmoid junction: Secondary | ICD-10-CM | POA: Diagnosis not present

## 2019-03-10 DIAGNOSIS — C3490 Malignant neoplasm of unspecified part of unspecified bronchus or lung: Secondary | ICD-10-CM | POA: Diagnosis not present

## 2019-03-10 DIAGNOSIS — I110 Hypertensive heart disease with heart failure: Secondary | ICD-10-CM | POA: Diagnosis not present

## 2019-03-10 DIAGNOSIS — Z298 Encounter for other specified prophylactic measures: Secondary | ICD-10-CM | POA: Diagnosis not present

## 2019-03-10 DIAGNOSIS — E039 Hypothyroidism, unspecified: Secondary | ICD-10-CM | POA: Diagnosis not present

## 2019-03-10 DIAGNOSIS — C3412 Malignant neoplasm of upper lobe, left bronchus or lung: Secondary | ICD-10-CM | POA: Diagnosis not present

## 2019-03-10 DIAGNOSIS — Z510 Encounter for antineoplastic radiation therapy: Secondary | ICD-10-CM | POA: Diagnosis not present

## 2019-03-10 NOTE — Progress Notes (Signed)
[place] Spiritual Care Note  Provided spiritual and emotional support, particularly empathic listening and pastoral reflection, per referral from Dr Burr Medico. Stephanie Combs is beginning to process the news of brain mets, which compounds her other health stressors. She welcomed opportunity to talk through what was on her mind, and we plan to f/u by phone next week.   [place], [place], [facility] Pager [phone] Voicemail [phone]

## 2019-03-10 NOTE — Progress Notes (Signed)
  Radiation Oncology         [phone] ________________________________  Name: KELSYE LOOMER MRN: [id number]  Date: 03/09/2019  DOB: 02-13-1951    Simulation and treatment planning note  DIAGNOSIS:     ICD-10-CM   1. 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 The patient was placed in a supine position and a customized thermoplastic head cast was constructed to aid in patient immobilization during the treatment. This complex treatment device will be used on a daily basis. In this fashion a CT scan was obtained through the head and neck region and isocenter was placed near midline within the brain.  The patient will be planned to receive a course of whole brain radiation treatment to a dose of 30 gray in 10 fractions at 3 gray per fraction. To accomplish this, 2 customized blocks have been designed which corresponds to left and right whole brain radiation fields. These 2 complex treatment devices will be used on a daily basis during the course of radiation. A complex isodose plan is requested to insure that the target area is adequately covered in to facilitate optimization of the treatment plan. A forward planning technique will also be evaluated to determine if this approach significantly improves the plan.   ________________________________   Jodelle Gross, MD, PhD

## 2019-03-10 NOTE — Telephone Encounter (Signed)
Spoke with patient per Dr. Burr Medico instructed to take the Xanax 1 mg three times daily, have not heard back from Dr. Freddie Apley office. The patient verbalized an understanding.

## 2019-03-10 NOTE — Telephone Encounter (Signed)
Left message to Dr. Merlene Laughter regarding patient's Xanax.  She is requesting she be able to take it 3 times a day and NP had reduced to two times a day. Requested they let us know if able to take care of this for the patient as she has been recently diagnosed with brain metastasis.

## 2019-03-11 ENCOUNTER — Telehealth: Payer: Self-pay | Admitting: Hematology

## 2019-03-11 NOTE — Telephone Encounter (Signed)
I talk with patient regarding schedule  

## 2019-03-16 ENCOUNTER — Encounter: Payer: Self-pay | Admitting: General Practice

## 2019-03-16 ENCOUNTER — Ambulatory Visit
Admission: RE | Admit: 2019-03-16 | Discharge: 2019-03-16 | Disposition: A | Discharge status: Home / Self Care | Payer: Medicare Other | Source: Ambulatory Visit | Attending: Radiation Oncology | Admitting: Radiation Oncology

## 2019-03-16 ENCOUNTER — Other Ambulatory Visit: Payer: Self-pay

## 2019-03-16 DIAGNOSIS — Z510 Encounter for antineoplastic radiation therapy: Secondary | ICD-10-CM | POA: Diagnosis not present

## 2019-03-16 DIAGNOSIS — D509 Iron deficiency anemia, unspecified: Secondary | ICD-10-CM | POA: Diagnosis not present

## 2019-03-16 DIAGNOSIS — C3490 Malignant neoplasm of unspecified part of unspecified bronchus or lung: Secondary | ICD-10-CM | POA: Insufficient documentation

## 2019-03-16 DIAGNOSIS — E119 Type 2 diabetes mellitus without complications: Secondary | ICD-10-CM | POA: Insufficient documentation

## 2019-03-16 DIAGNOSIS — E039 Hypothyroidism, unspecified: Secondary | ICD-10-CM | POA: Diagnosis not present

## 2019-03-16 DIAGNOSIS — I110 Hypertensive heart disease with heart failure: Secondary | ICD-10-CM | POA: Insufficient documentation

## 2019-03-16 DIAGNOSIS — C3412 Malignant neoplasm of upper lobe, left bronchus or lung: Secondary | ICD-10-CM | POA: Diagnosis not present

## 2019-03-16 DIAGNOSIS — C7931 Secondary malignant neoplasm of brain: Secondary | ICD-10-CM | POA: Insufficient documentation

## 2019-03-16 DIAGNOSIS — I2510 Atherosclerotic heart disease of native coronary artery without angina pectoris: Secondary | ICD-10-CM | POA: Insufficient documentation

## 2019-03-16 DIAGNOSIS — Z298 Encounter for other specified prophylactic measures: Secondary | ICD-10-CM | POA: Diagnosis not present

## 2019-03-16 DIAGNOSIS — C19 Malignant neoplasm of rectosigmoid junction: Secondary | ICD-10-CM | POA: Diagnosis not present

## 2019-03-16 DIAGNOSIS — I509 Heart failure, unspecified: Secondary | ICD-10-CM | POA: Diagnosis not present

## 2019-03-16 DIAGNOSIS — J449 Chronic obstructive pulmonary disease, unspecified: Secondary | ICD-10-CM | POA: Diagnosis not present

## 2019-03-16 NOTE — Progress Notes (Signed)
[place] Spiritual Care Note  Followed up by phone but caught Stephanie Combs at an inconvenient time. We will work around her radiation schedule to talk later in the week.    [place], [place], [facility] Pager [phone] Voicemail [phone]

## 2019-03-17 ENCOUNTER — Encounter: Payer: Self-pay | Admitting: General Practice

## 2019-03-17 ENCOUNTER — Encounter: Payer: Self-pay | Admitting: Radiation Oncology

## 2019-03-17 ENCOUNTER — Ambulatory Visit
Admission: RE | Admit: 2019-03-17 | Discharge: 2019-03-17 | Disposition: A | Discharge status: Home / Self Care | Payer: Medicare Other | Source: Ambulatory Visit | Attending: Radiation Oncology | Admitting: Radiation Oncology

## 2019-03-17 ENCOUNTER — Other Ambulatory Visit: Payer: Self-pay

## 2019-03-17 DIAGNOSIS — C19 Malignant neoplasm of rectosigmoid junction: Secondary | ICD-10-CM | POA: Diagnosis not present

## 2019-03-17 DIAGNOSIS — C3490 Malignant neoplasm of unspecified part of unspecified bronchus or lung: Secondary | ICD-10-CM | POA: Diagnosis not present

## 2019-03-17 DIAGNOSIS — D509 Iron deficiency anemia, unspecified: Secondary | ICD-10-CM | POA: Diagnosis not present

## 2019-03-17 DIAGNOSIS — Z510 Encounter for antineoplastic radiation therapy: Secondary | ICD-10-CM | POA: Diagnosis not present

## 2019-03-17 DIAGNOSIS — C7931 Secondary malignant neoplasm of brain: Secondary | ICD-10-CM | POA: Diagnosis not present

## 2019-03-17 DIAGNOSIS — I509 Heart failure, unspecified: Secondary | ICD-10-CM | POA: Diagnosis not present

## 2019-03-17 DIAGNOSIS — E039 Hypothyroidism, unspecified: Secondary | ICD-10-CM | POA: Diagnosis not present

## 2019-03-17 DIAGNOSIS — I2510 Atherosclerotic heart disease of native coronary artery without angina pectoris: Secondary | ICD-10-CM | POA: Diagnosis not present

## 2019-03-17 DIAGNOSIS — I110 Hypertensive heart disease with heart failure: Secondary | ICD-10-CM | POA: Diagnosis not present

## 2019-03-17 DIAGNOSIS — E119 Type 2 diabetes mellitus without complications: Secondary | ICD-10-CM | POA: Diagnosis not present

## 2019-03-17 NOTE — Progress Notes (Signed)
[facility] Spiritual Care Note  Followed up by phone as planned. LVM encouraging callback.   [place], [place], [facility] Pager [phone] Voicemail [phone]

## 2019-03-18 ENCOUNTER — Other Ambulatory Visit: Payer: Self-pay | Admitting: Radiation Oncology

## 2019-03-18 ENCOUNTER — Other Ambulatory Visit: Payer: Self-pay

## 2019-03-18 ENCOUNTER — Ambulatory Visit
Admission: RE | Admit: 2019-03-18 | Discharge: 2019-03-18 | Disposition: A | Discharge status: Home / Self Care | Payer: Medicare Other | Source: Ambulatory Visit | Attending: Radiation Oncology | Admitting: Radiation Oncology

## 2019-03-18 DIAGNOSIS — D509 Iron deficiency anemia, unspecified: Secondary | ICD-10-CM | POA: Diagnosis not present

## 2019-03-18 DIAGNOSIS — E119 Type 2 diabetes mellitus without complications: Secondary | ICD-10-CM | POA: Diagnosis not present

## 2019-03-18 DIAGNOSIS — E039 Hypothyroidism, unspecified: Secondary | ICD-10-CM | POA: Diagnosis not present

## 2019-03-18 DIAGNOSIS — I2510 Atherosclerotic heart disease of native coronary artery without angina pectoris: Secondary | ICD-10-CM | POA: Diagnosis not present

## 2019-03-18 DIAGNOSIS — C7931 Secondary malignant neoplasm of brain: Secondary | ICD-10-CM

## 2019-03-18 DIAGNOSIS — I509 Heart failure, unspecified: Secondary | ICD-10-CM | POA: Diagnosis not present

## 2019-03-18 DIAGNOSIS — C3490 Malignant neoplasm of unspecified part of unspecified bronchus or lung: Secondary | ICD-10-CM | POA: Diagnosis not present

## 2019-03-18 DIAGNOSIS — C19 Malignant neoplasm of rectosigmoid junction: Secondary | ICD-10-CM | POA: Diagnosis not present

## 2019-03-18 DIAGNOSIS — Z510 Encounter for antineoplastic radiation therapy: Secondary | ICD-10-CM | POA: Diagnosis not present

## 2019-03-18 DIAGNOSIS — I110 Hypertensive heart disease with heart failure: Secondary | ICD-10-CM | POA: Diagnosis not present

## 2019-03-18 MED ORDER — SONAFINE EX EMUL
1.0000 "application " | Freq: Two times a day (BID) | CUTANEOUS | Status: DC
  Administered 2019-03-18: 1 via TOPICAL

## 2019-03-18 MED ORDER — OXYCODONE HCL 5 MG PO TABS: 5.0000 mg | ORAL_TABLET | Freq: Two times a day (BID) | ORAL | 0 refills | Status: DC | PRN

## 2019-03-18 MED FILL — oxyCODONE HCL 5 MG TABS: 5 | 15 days supply | Qty: 30 | Fill #0

## 2019-03-21 ENCOUNTER — Other Ambulatory Visit: Payer: Self-pay

## 2019-03-21 ENCOUNTER — Ambulatory Visit
Admission: RE | Admit: 2019-03-21 | Discharge: 2019-03-21 | Disposition: A | Discharge status: Home / Self Care | Payer: Medicare Other | Source: Ambulatory Visit | Attending: Radiation Oncology | Admitting: Radiation Oncology

## 2019-03-21 DIAGNOSIS — I110 Hypertensive heart disease with heart failure: Secondary | ICD-10-CM | POA: Diagnosis not present

## 2019-03-21 DIAGNOSIS — D509 Iron deficiency anemia, unspecified: Secondary | ICD-10-CM | POA: Diagnosis not present

## 2019-03-21 DIAGNOSIS — Z510 Encounter for antineoplastic radiation therapy: Secondary | ICD-10-CM | POA: Diagnosis not present

## 2019-03-21 DIAGNOSIS — E039 Hypothyroidism, unspecified: Secondary | ICD-10-CM | POA: Diagnosis not present

## 2019-03-21 DIAGNOSIS — E119 Type 2 diabetes mellitus without complications: Secondary | ICD-10-CM | POA: Diagnosis not present

## 2019-03-21 DIAGNOSIS — I2510 Atherosclerotic heart disease of native coronary artery without angina pectoris: Secondary | ICD-10-CM | POA: Diagnosis not present

## 2019-03-21 DIAGNOSIS — I509 Heart failure, unspecified: Secondary | ICD-10-CM | POA: Diagnosis not present

## 2019-03-21 DIAGNOSIS — C3490 Malignant neoplasm of unspecified part of unspecified bronchus or lung: Secondary | ICD-10-CM | POA: Diagnosis not present

## 2019-03-21 DIAGNOSIS — C7931 Secondary malignant neoplasm of brain: Secondary | ICD-10-CM | POA: Diagnosis not present

## 2019-03-21 DIAGNOSIS — C19 Malignant neoplasm of rectosigmoid junction: Secondary | ICD-10-CM | POA: Diagnosis not present

## 2019-03-22 ENCOUNTER — Ambulatory Visit
Admission: RE | Admit: 2019-03-22 | Discharge: 2019-03-22 | Disposition: A | Discharge status: Home / Self Care | Payer: Medicare Other | Source: Ambulatory Visit | Attending: Radiation Oncology | Admitting: Radiation Oncology

## 2019-03-22 ENCOUNTER — Inpatient Hospital Stay: Payer: Medicare Other | Attending: Hematology

## 2019-03-22 ENCOUNTER — Other Ambulatory Visit: Payer: Self-pay

## 2019-03-22 DIAGNOSIS — I509 Heart failure, unspecified: Secondary | ICD-10-CM | POA: Diagnosis not present

## 2019-03-22 DIAGNOSIS — I2510 Atherosclerotic heart disease of native coronary artery without angina pectoris: Secondary | ICD-10-CM | POA: Diagnosis not present

## 2019-03-22 DIAGNOSIS — I110 Hypertensive heart disease with heart failure: Secondary | ICD-10-CM | POA: Diagnosis not present

## 2019-03-22 DIAGNOSIS — C3412 Malignant neoplasm of upper lobe, left bronchus or lung: Secondary | ICD-10-CM | POA: Diagnosis not present

## 2019-03-22 DIAGNOSIS — D509 Iron deficiency anemia, unspecified: Secondary | ICD-10-CM | POA: Diagnosis not present

## 2019-03-22 DIAGNOSIS — C7931 Secondary malignant neoplasm of brain: Secondary | ICD-10-CM | POA: Diagnosis not present

## 2019-03-22 DIAGNOSIS — C3490 Malignant neoplasm of unspecified part of unspecified bronchus or lung: Secondary | ICD-10-CM | POA: Diagnosis not present

## 2019-03-22 DIAGNOSIS — Z510 Encounter for antineoplastic radiation therapy: Secondary | ICD-10-CM | POA: Diagnosis not present

## 2019-03-22 DIAGNOSIS — C19 Malignant neoplasm of rectosigmoid junction: Secondary | ICD-10-CM | POA: Diagnosis not present

## 2019-03-22 DIAGNOSIS — Z298 Encounter for other specified prophylactic measures: Secondary | ICD-10-CM | POA: Diagnosis not present

## 2019-03-22 DIAGNOSIS — E119 Type 2 diabetes mellitus without complications: Secondary | ICD-10-CM | POA: Diagnosis not present

## 2019-03-22 DIAGNOSIS — E039 Hypothyroidism, unspecified: Secondary | ICD-10-CM | POA: Diagnosis not present

## 2019-03-23 ENCOUNTER — Ambulatory Visit
Admission: RE | Admit: 2019-03-23 | Discharge: 2019-03-23 | Disposition: A | Discharge status: Home / Self Care | Payer: Medicare Other | Source: Ambulatory Visit | Attending: Radiation Oncology | Admitting: Radiation Oncology

## 2019-03-23 ENCOUNTER — Other Ambulatory Visit: Payer: Self-pay

## 2019-03-23 DIAGNOSIS — I509 Heart failure, unspecified: Secondary | ICD-10-CM | POA: Diagnosis not present

## 2019-03-23 DIAGNOSIS — I110 Hypertensive heart disease with heart failure: Secondary | ICD-10-CM | POA: Diagnosis not present

## 2019-03-23 DIAGNOSIS — C19 Malignant neoplasm of rectosigmoid junction: Secondary | ICD-10-CM | POA: Diagnosis not present

## 2019-03-23 DIAGNOSIS — E039 Hypothyroidism, unspecified: Secondary | ICD-10-CM | POA: Diagnosis not present

## 2019-03-23 DIAGNOSIS — Z510 Encounter for antineoplastic radiation therapy: Secondary | ICD-10-CM | POA: Diagnosis not present

## 2019-03-23 DIAGNOSIS — D509 Iron deficiency anemia, unspecified: Secondary | ICD-10-CM | POA: Diagnosis not present

## 2019-03-23 DIAGNOSIS — C7931 Secondary malignant neoplasm of brain: Secondary | ICD-10-CM | POA: Diagnosis not present

## 2019-03-23 DIAGNOSIS — C3490 Malignant neoplasm of unspecified part of unspecified bronchus or lung: Secondary | ICD-10-CM | POA: Diagnosis not present

## 2019-03-23 DIAGNOSIS — E119 Type 2 diabetes mellitus without complications: Secondary | ICD-10-CM | POA: Diagnosis not present

## 2019-03-23 DIAGNOSIS — I2510 Atherosclerotic heart disease of native coronary artery without angina pectoris: Secondary | ICD-10-CM | POA: Diagnosis not present

## 2019-03-24 ENCOUNTER — Ambulatory Visit
Admission: RE | Admit: 2019-03-24 | Discharge: 2019-03-24 | Disposition: A | Discharge status: Home / Self Care | Payer: Medicare Other | Source: Ambulatory Visit | Attending: Radiation Oncology | Admitting: Radiation Oncology

## 2019-03-24 ENCOUNTER — Encounter: Payer: Self-pay | Admitting: Radiation Oncology

## 2019-03-24 ENCOUNTER — Other Ambulatory Visit: Payer: Self-pay

## 2019-03-24 DIAGNOSIS — I110 Hypertensive heart disease with heart failure: Secondary | ICD-10-CM | POA: Diagnosis not present

## 2019-03-24 DIAGNOSIS — C3490 Malignant neoplasm of unspecified part of unspecified bronchus or lung: Secondary | ICD-10-CM | POA: Diagnosis not present

## 2019-03-24 DIAGNOSIS — D509 Iron deficiency anemia, unspecified: Secondary | ICD-10-CM | POA: Diagnosis not present

## 2019-03-24 DIAGNOSIS — I509 Heart failure, unspecified: Secondary | ICD-10-CM | POA: Diagnosis not present

## 2019-03-24 DIAGNOSIS — Z510 Encounter for antineoplastic radiation therapy: Secondary | ICD-10-CM | POA: Diagnosis not present

## 2019-03-24 DIAGNOSIS — I2510 Atherosclerotic heart disease of native coronary artery without angina pectoris: Secondary | ICD-10-CM | POA: Diagnosis not present

## 2019-03-24 DIAGNOSIS — E119 Type 2 diabetes mellitus without complications: Secondary | ICD-10-CM | POA: Diagnosis not present

## 2019-03-24 DIAGNOSIS — E039 Hypothyroidism, unspecified: Secondary | ICD-10-CM | POA: Diagnosis not present

## 2019-03-24 DIAGNOSIS — C19 Malignant neoplasm of rectosigmoid junction: Secondary | ICD-10-CM | POA: Diagnosis not present

## 2019-03-24 DIAGNOSIS — C7931 Secondary malignant neoplasm of brain: Secondary | ICD-10-CM | POA: Diagnosis not present

## 2019-03-25 ENCOUNTER — Other Ambulatory Visit: Payer: Self-pay

## 2019-03-25 ENCOUNTER — Encounter: Payer: Self-pay | Admitting: Radiation Oncology

## 2019-03-25 ENCOUNTER — Ambulatory Visit
Admission: RE | Admit: 2019-03-25 | Discharge: 2019-03-25 | Disposition: A | Discharge status: Home / Self Care | Payer: Medicare Other | Source: Ambulatory Visit | Attending: Radiation Oncology | Admitting: Radiation Oncology

## 2019-03-25 DIAGNOSIS — D509 Iron deficiency anemia, unspecified: Secondary | ICD-10-CM | POA: Diagnosis not present

## 2019-03-25 DIAGNOSIS — I509 Heart failure, unspecified: Secondary | ICD-10-CM | POA: Diagnosis not present

## 2019-03-25 DIAGNOSIS — C7931 Secondary malignant neoplasm of brain: Secondary | ICD-10-CM | POA: Diagnosis not present

## 2019-03-25 DIAGNOSIS — E039 Hypothyroidism, unspecified: Secondary | ICD-10-CM | POA: Diagnosis not present

## 2019-03-25 DIAGNOSIS — I2510 Atherosclerotic heart disease of native coronary artery without angina pectoris: Secondary | ICD-10-CM | POA: Diagnosis not present

## 2019-03-25 DIAGNOSIS — C3490 Malignant neoplasm of unspecified part of unspecified bronchus or lung: Secondary | ICD-10-CM | POA: Diagnosis not present

## 2019-03-25 DIAGNOSIS — I110 Hypertensive heart disease with heart failure: Secondary | ICD-10-CM | POA: Diagnosis not present

## 2019-03-25 DIAGNOSIS — C19 Malignant neoplasm of rectosigmoid junction: Secondary | ICD-10-CM | POA: Diagnosis not present

## 2019-03-25 DIAGNOSIS — Z510 Encounter for antineoplastic radiation therapy: Secondary | ICD-10-CM | POA: Diagnosis not present

## 2019-03-25 DIAGNOSIS — E119 Type 2 diabetes mellitus without complications: Secondary | ICD-10-CM | POA: Diagnosis not present

## 2019-03-28 ENCOUNTER — Other Ambulatory Visit: Payer: Self-pay

## 2019-03-28 ENCOUNTER — Ambulatory Visit
Admission: RE | Admit: 2019-03-28 | Discharge: 2019-03-28 | Disposition: A | Discharge status: Home / Self Care | Payer: Medicare Other | Source: Ambulatory Visit | Attending: Radiation Oncology | Admitting: Radiation Oncology

## 2019-03-28 ENCOUNTER — Encounter: Payer: Self-pay | Admitting: Radiation Oncology

## 2019-03-28 DIAGNOSIS — D509 Iron deficiency anemia, unspecified: Secondary | ICD-10-CM | POA: Diagnosis not present

## 2019-03-28 DIAGNOSIS — I2510 Atherosclerotic heart disease of native coronary artery without angina pectoris: Secondary | ICD-10-CM | POA: Diagnosis not present

## 2019-03-28 DIAGNOSIS — Z510 Encounter for antineoplastic radiation therapy: Secondary | ICD-10-CM | POA: Diagnosis not present

## 2019-03-28 DIAGNOSIS — I509 Heart failure, unspecified: Secondary | ICD-10-CM | POA: Diagnosis not present

## 2019-03-28 DIAGNOSIS — C19 Malignant neoplasm of rectosigmoid junction: Secondary | ICD-10-CM | POA: Diagnosis not present

## 2019-03-28 DIAGNOSIS — I110 Hypertensive heart disease with heart failure: Secondary | ICD-10-CM | POA: Diagnosis not present

## 2019-03-28 DIAGNOSIS — E119 Type 2 diabetes mellitus without complications: Secondary | ICD-10-CM | POA: Diagnosis not present

## 2019-03-28 DIAGNOSIS — R001 Bradycardia, unspecified: Secondary | ICD-10-CM | POA: Diagnosis not present

## 2019-03-28 DIAGNOSIS — Z743 Need for continuous supervision: Secondary | ICD-10-CM | POA: Diagnosis not present

## 2019-03-28 DIAGNOSIS — E039 Hypothyroidism, unspecified: Secondary | ICD-10-CM | POA: Diagnosis not present

## 2019-03-28 DIAGNOSIS — C7931 Secondary malignant neoplasm of brain: Secondary | ICD-10-CM | POA: Diagnosis not present

## 2019-03-28 DIAGNOSIS — E1165 Type 2 diabetes mellitus with hyperglycemia: Secondary | ICD-10-CM | POA: Diagnosis not present

## 2019-03-28 DIAGNOSIS — C3490 Malignant neoplasm of unspecified part of unspecified bronchus or lung: Secondary | ICD-10-CM | POA: Diagnosis not present

## 2019-03-29 ENCOUNTER — Other Ambulatory Visit: Payer: Self-pay

## 2019-03-29 ENCOUNTER — Ambulatory Visit
Admission: RE | Admit: 2019-03-29 | Discharge: 2019-03-29 | Disposition: A | Discharge status: Home / Self Care | Payer: Medicare Other | Source: Ambulatory Visit | Attending: Radiation Oncology | Admitting: Radiation Oncology

## 2019-03-29 DIAGNOSIS — E119 Type 2 diabetes mellitus without complications: Secondary | ICD-10-CM | POA: Diagnosis not present

## 2019-03-29 DIAGNOSIS — I2510 Atherosclerotic heart disease of native coronary artery without angina pectoris: Secondary | ICD-10-CM | POA: Diagnosis not present

## 2019-03-29 DIAGNOSIS — C19 Malignant neoplasm of rectosigmoid junction: Secondary | ICD-10-CM | POA: Diagnosis not present

## 2019-03-29 DIAGNOSIS — I509 Heart failure, unspecified: Secondary | ICD-10-CM | POA: Diagnosis not present

## 2019-03-29 DIAGNOSIS — C3412 Malignant neoplasm of upper lobe, left bronchus or lung: Secondary | ICD-10-CM | POA: Diagnosis not present

## 2019-03-29 DIAGNOSIS — Z298 Encounter for other specified prophylactic measures: Secondary | ICD-10-CM | POA: Diagnosis not present

## 2019-03-29 DIAGNOSIS — C7931 Secondary malignant neoplasm of brain: Secondary | ICD-10-CM | POA: Diagnosis not present

## 2019-03-29 DIAGNOSIS — I110 Hypertensive heart disease with heart failure: Secondary | ICD-10-CM | POA: Diagnosis not present

## 2019-03-29 DIAGNOSIS — D509 Iron deficiency anemia, unspecified: Secondary | ICD-10-CM | POA: Diagnosis not present

## 2019-03-29 DIAGNOSIS — Z510 Encounter for antineoplastic radiation therapy: Secondary | ICD-10-CM | POA: Diagnosis not present

## 2019-03-29 DIAGNOSIS — E039 Hypothyroidism, unspecified: Secondary | ICD-10-CM | POA: Diagnosis not present

## 2019-03-29 DIAGNOSIS — C3490 Malignant neoplasm of unspecified part of unspecified bronchus or lung: Secondary | ICD-10-CM | POA: Diagnosis not present

## 2019-03-31 ENCOUNTER — Encounter (HOSPITAL_COMMUNITY): Payer: Self-pay | Admitting: *Deleted

## 2019-03-31 ENCOUNTER — Emergency Department (HOSPITAL_COMMUNITY): Payer: Medicare Other

## 2019-03-31 ENCOUNTER — Other Ambulatory Visit: Payer: Self-pay

## 2019-03-31 ENCOUNTER — Inpatient Hospital Stay (HOSPITAL_COMMUNITY)
Admission: EM | Admit: 2019-03-31 | Discharge: 2019-04-03 | DRG: 070 | Disposition: A | Discharge status: Home / Self Care | Payer: Medicare Other | Attending: Internal Medicine | Admitting: Internal Medicine

## 2019-03-31 DIAGNOSIS — E1140 Type 2 diabetes mellitus with diabetic neuropathy, unspecified: Secondary | ICD-10-CM | POA: Diagnosis not present

## 2019-03-31 DIAGNOSIS — Z66 Do not resuscitate: Secondary | ICD-10-CM | POA: Diagnosis present

## 2019-03-31 DIAGNOSIS — Z8249 Family history of ischemic heart disease and other diseases of the circulatory system: Secondary | ICD-10-CM

## 2019-03-31 DIAGNOSIS — Z9981 Dependence on supplemental oxygen: Secondary | ICD-10-CM | POA: Diagnosis not present

## 2019-03-31 DIAGNOSIS — G43909 Migraine, unspecified, not intractable, without status migrainosus: Secondary | ICD-10-CM | POA: Diagnosis present

## 2019-03-31 DIAGNOSIS — G8929 Other chronic pain: Secondary | ICD-10-CM | POA: Diagnosis not present

## 2019-03-31 DIAGNOSIS — Z8619 Personal history of other infectious and parasitic diseases: Secondary | ICD-10-CM

## 2019-03-31 DIAGNOSIS — R4182 Altered mental status, unspecified: Secondary | ICD-10-CM

## 2019-03-31 DIAGNOSIS — G9340 Encephalopathy, unspecified: Secondary | ICD-10-CM | POA: Diagnosis present

## 2019-03-31 DIAGNOSIS — R9431 Abnormal electrocardiogram [ECG] [EKG]: Secondary | ICD-10-CM | POA: Diagnosis not present

## 2019-03-31 DIAGNOSIS — I5032 Chronic diastolic (congestive) heart failure: Secondary | ICD-10-CM | POA: Diagnosis not present

## 2019-03-31 DIAGNOSIS — E1122 Type 2 diabetes mellitus with diabetic chronic kidney disease: Secondary | ICD-10-CM | POA: Diagnosis not present

## 2019-03-31 DIAGNOSIS — Z03818 Encounter for observation for suspected exposure to other biological agents ruled out: Secondary | ICD-10-CM | POA: Diagnosis not present

## 2019-03-31 DIAGNOSIS — I130 Hypertensive heart and chronic kidney disease with heart failure and stage 1 through stage 4 chronic kidney disease, or unspecified chronic kidney disease: Secondary | ICD-10-CM | POA: Diagnosis not present

## 2019-03-31 DIAGNOSIS — Z79899 Other long term (current) drug therapy: Secondary | ICD-10-CM

## 2019-03-31 DIAGNOSIS — IMO0002 Reserved for concepts with insufficient information to code with codable children: Secondary | ICD-10-CM | POA: Diagnosis present

## 2019-03-31 DIAGNOSIS — J189 Pneumonia, unspecified organism: Secondary | ICD-10-CM | POA: Diagnosis present

## 2019-03-31 DIAGNOSIS — Z794 Long term (current) use of insulin: Secondary | ICD-10-CM | POA: Diagnosis not present

## 2019-03-31 DIAGNOSIS — N183 Chronic kidney disease, stage 3 unspecified: Secondary | ICD-10-CM | POA: Diagnosis not present

## 2019-03-31 DIAGNOSIS — Z79891 Long term (current) use of opiate analgesic: Secondary | ICD-10-CM

## 2019-03-31 DIAGNOSIS — N182 Chronic kidney disease, stage 2 (mild): Secondary | ICD-10-CM | POA: Diagnosis not present

## 2019-03-31 DIAGNOSIS — C7931 Secondary malignant neoplasm of brain: Secondary | ICD-10-CM | POA: Diagnosis present

## 2019-03-31 DIAGNOSIS — C3490 Malignant neoplasm of unspecified part of unspecified bronchus or lung: Secondary | ICD-10-CM | POA: Diagnosis not present

## 2019-03-31 DIAGNOSIS — F419 Anxiety disorder, unspecified: Secondary | ICD-10-CM | POA: Diagnosis present

## 2019-03-31 DIAGNOSIS — J440 Chronic obstructive pulmonary disease with acute lower respiratory infection: Secondary | ICD-10-CM | POA: Diagnosis present

## 2019-03-31 DIAGNOSIS — Z955 Presence of coronary angioplasty implant and graft: Secondary | ICD-10-CM

## 2019-03-31 DIAGNOSIS — G9341 Metabolic encephalopathy: Secondary | ICD-10-CM | POA: Diagnosis not present

## 2019-03-31 DIAGNOSIS — E785 Hyperlipidemia, unspecified: Secondary | ICD-10-CM | POA: Diagnosis present

## 2019-03-31 DIAGNOSIS — I10 Essential (primary) hypertension: Secondary | ICD-10-CM | POA: Diagnosis present

## 2019-03-31 DIAGNOSIS — Z20828 Contact with and (suspected) exposure to other viral communicable diseases: Secondary | ICD-10-CM | POA: Diagnosis present

## 2019-03-31 DIAGNOSIS — N179 Acute kidney failure, unspecified: Secondary | ICD-10-CM | POA: Diagnosis not present

## 2019-03-31 DIAGNOSIS — E039 Hypothyroidism, unspecified: Secondary | ICD-10-CM | POA: Diagnosis not present

## 2019-03-31 DIAGNOSIS — Z880 Allergy status to penicillin: Secondary | ICD-10-CM

## 2019-03-31 DIAGNOSIS — E1165 Type 2 diabetes mellitus with hyperglycemia: Secondary | ICD-10-CM | POA: Diagnosis not present

## 2019-03-31 DIAGNOSIS — C189 Malignant neoplasm of colon, unspecified: Secondary | ICD-10-CM | POA: Diagnosis not present

## 2019-03-31 DIAGNOSIS — R404 Transient alteration of awareness: Secondary | ICD-10-CM | POA: Diagnosis not present

## 2019-03-31 DIAGNOSIS — M545 Low back pain: Secondary | ICD-10-CM | POA: Diagnosis not present

## 2019-03-31 DIAGNOSIS — J984 Other disorders of lung: Secondary | ICD-10-CM | POA: Diagnosis not present

## 2019-03-31 DIAGNOSIS — Z86010 Personal history of colonic polyps: Secondary | ICD-10-CM

## 2019-03-31 DIAGNOSIS — R410 Disorientation, unspecified: Secondary | ICD-10-CM | POA: Diagnosis not present

## 2019-03-31 DIAGNOSIS — Z87891 Personal history of nicotine dependence: Secondary | ICD-10-CM

## 2019-03-31 DIAGNOSIS — I2510 Atherosclerotic heart disease of native coronary artery without angina pectoris: Secondary | ICD-10-CM | POA: Diagnosis not present

## 2019-03-31 DIAGNOSIS — R4781 Slurred speech: Secondary | ICD-10-CM | POA: Diagnosis not present

## 2019-03-31 DIAGNOSIS — M109 Gout, unspecified: Secondary | ICD-10-CM | POA: Diagnosis not present

## 2019-03-31 DIAGNOSIS — R4020 Unspecified coma: Secondary | ICD-10-CM | POA: Diagnosis not present

## 2019-03-31 DIAGNOSIS — E119 Type 2 diabetes mellitus without complications: Secondary | ICD-10-CM

## 2019-03-31 DIAGNOSIS — Z888 Allergy status to other drugs, medicaments and biological substances status: Secondary | ICD-10-CM

## 2019-03-31 DIAGNOSIS — Z79890 Hormone replacement therapy (postmenopausal): Secondary | ICD-10-CM

## 2019-03-31 DIAGNOSIS — Z7982 Long term (current) use of aspirin: Secondary | ICD-10-CM

## 2019-03-31 DIAGNOSIS — Z833 Family history of diabetes mellitus: Secondary | ICD-10-CM

## 2019-03-31 HISTORY — DX: Malignant (primary) neoplasm, unspecified: C80.1

## 2019-03-31 LAB — CBC WITH DIFFERENTIAL/PLATELET
Abs Immature Granulocytes: 0.03 10*3/uL (ref 0.00–0.07)
Basophils Absolute: 0.1 10*3/uL (ref 0.0–0.1)
Basophils Relative: 1 %
Eosinophils Absolute: 0.1 10*3/uL (ref 0.0–0.5)
Eosinophils Relative: 1 %
HCT: 35.5 % — ABNORMAL LOW (ref 36.0–46.0)
Hemoglobin: 10.8 g/dL — ABNORMAL LOW (ref 12.0–15.0)
Immature Granulocytes: 0 %
Lymphocytes Relative: 5 %
Lymphs Abs: 0.4 10*3/uL — ABNORMAL LOW (ref 0.7–4.0)
MCH: 31.1 pg (ref 26.0–34.0)
MCHC: 30.4 g/dL (ref 30.0–36.0)
MCV: 102.3 fL — ABNORMAL HIGH (ref 80.0–100.0)
Monocytes Absolute: 0.3 10*3/uL (ref 0.1–1.0)
Monocytes Relative: 4 %
Neutro Abs: 6.3 10*3/uL (ref 1.7–7.7)
Neutrophils Relative %: 89 %
Platelets: 244 10*3/uL (ref 150–400)
RBC: 3.47 MIL/uL — ABNORMAL LOW (ref 3.87–5.11)
RDW: 14.4 % (ref 11.5–15.5)
WBC: 7.2 10*3/uL (ref 4.0–10.5)
nRBC: 0 % (ref 0.0–0.2)

## 2019-03-31 LAB — COMPREHENSIVE METABOLIC PANEL
ALT: 10 U/L (ref 0–44)
AST: 12 U/L — ABNORMAL LOW (ref 15–41)
Albumin: 3.6 g/dL (ref 3.5–5.0)
Alkaline Phosphatase: 98 U/L (ref 38–126)
Anion gap: 8 (ref 5–15)
BUN: 31 mg/dL — ABNORMAL HIGH (ref 8–23)
CO2: 28 mmol/L (ref 22–32)
Calcium: 9.3 mg/dL (ref 8.9–10.3)
Chloride: 101 mmol/L (ref 98–111)
Creatinine, Ser: 2.22 mg/dL — ABNORMAL HIGH (ref 0.44–1.00)
GFR calc Af Amer: 25 mL/min — ABNORMAL LOW (ref >60)
GFR calc non Af Amer: 22 mL/min — ABNORMAL LOW (ref >60)
Glucose, Bld: 270 mg/dL — ABNORMAL HIGH (ref 70–99)
Potassium: 3.8 mmol/L (ref 3.5–5.1)
Sodium: 137 mmol/L (ref 135–145)
Total Bilirubin: 0.9 mg/dL (ref 0.3–1.2)
Total Protein: 6.8 g/dL (ref 6.5–8.1)

## 2019-03-31 LAB — CBG MONITORING, ED
Glucose-Capillary: 389 mg/dL — ABNORMAL HIGH (ref 70–99)
Glucose-Capillary: 97 mg/dL (ref 70–99)

## 2019-03-31 LAB — LACTIC ACID, PLASMA
Lactic Acid, Venous: 2.2 mmol/L (ref 0.5–1.9)
Lactic Acid, Venous: 2.6 mmol/L (ref 0.5–1.9)

## 2019-03-31 MED ORDER — ACETAMINOPHEN 650 MG RE SUPP: 650.0000 mg | Freq: Four times a day (QID) | RECTAL | Status: DC | PRN

## 2019-03-31 MED ORDER — LEVOFLOXACIN IN D5W 750 MG/150ML IV SOLN
750.0000 mg | INTRAVENOUS | Status: DC
  Administered 2019-04-01 – 2019-04-03 (×2): 750 mg via INTRAVENOUS
  Filled 2019-03-31 (×2): qty 150

## 2019-03-31 MED ORDER — SODIUM CHLORIDE 0.9 % IV SOLN: 2.0000 g | Freq: Two times a day (BID) | INTRAVENOUS | Status: DC

## 2019-03-31 MED ORDER — INSULIN ASPART 100 UNIT/ML ~~LOC~~ SOLN
0.0000 [IU] | Freq: Every day | SUBCUTANEOUS | Status: DC
  Administered 2019-04-01 – 2019-04-02 (×2): 2 [IU] via SUBCUTANEOUS

## 2019-03-31 MED ORDER — VANCOMYCIN HCL 10 G IV SOLR: 1500.0000 mg | INTRAVENOUS | Status: DC

## 2019-03-31 MED ORDER — SODIUM CHLORIDE 0.9 % IV SOLN
INTRAVENOUS | Status: AC
  Administered 2019-03-31: 22:00:00 via INTRAVENOUS

## 2019-03-31 MED ORDER — SODIUM CHLORIDE 0.9 % IV SOLN
Freq: Once | INTRAVENOUS | Status: AC
  Administered 2019-03-31: 20:00:00 via INTRAVENOUS

## 2019-03-31 MED ORDER — ENOXAPARIN SODIUM 60 MG/0.6ML ~~LOC~~ SOLN
60.0000 mg | SUBCUTANEOUS | Status: DC
  Administered 2019-04-01 (×2): 60 mg via SUBCUTANEOUS
  Filled 2019-03-31 (×2): qty 0.6

## 2019-03-31 MED ORDER — VANCOMYCIN HCL IN DEXTROSE 1-5 GM/200ML-% IV SOLN
2000.0000 mg | Freq: Once | INTRAVENOUS | Status: AC
  Administered 2019-03-31: 2000 mg via INTRAVENOUS
  Filled 2019-03-31: qty 400

## 2019-03-31 MED ORDER — INSULIN ASPART 100 UNIT/ML ~~LOC~~ SOLN
0.0000 [IU] | Freq: Three times a day (TID) | SUBCUTANEOUS | Status: DC
  Administered 2019-04-01: 2 [IU] via SUBCUTANEOUS
  Administered 2019-04-02: 3 [IU] via SUBCUTANEOUS
  Administered 2019-04-02 – 2019-04-03 (×2): 2 [IU] via SUBCUTANEOUS
  Administered 2019-04-03: 1 [IU] via SUBCUTANEOUS
  Administered 2019-04-03: 3 [IU] via SUBCUTANEOUS
  Filled 2019-03-31: qty 1

## 2019-03-31 MED ORDER — ACETAMINOPHEN 325 MG PO TABS: 650.0000 mg | ORAL_TABLET | Freq: Four times a day (QID) | ORAL | Status: DC | PRN

## 2019-03-31 MED ORDER — SODIUM CHLORIDE 0.9 % IV SOLN
Freq: Once | INTRAVENOUS | Status: AC
  Administered 2019-03-31: 17:00:00 via INTRAVENOUS

## 2019-03-31 MED ORDER — INSULIN ASPART 100 UNIT/ML ~~LOC~~ SOLN: 0.0000 [IU] | SUBCUTANEOUS | Status: DC

## 2019-03-31 MED ORDER — SODIUM CHLORIDE 0.9 % IV BOLUS: 500.0000 mL | Freq: Once | INTRAVENOUS | Status: DC

## 2019-03-31 MED ORDER — SODIUM CHLORIDE 0.9 % IV SOLN
2.0000 g | Freq: Once | INTRAVENOUS | Status: AC
  Administered 2019-03-31: 2 g via INTRAVENOUS
  Filled 2019-03-31: qty 2

## 2019-03-31 NOTE — ED Provider Notes (Addendum)
[facility] EMERGENCY DEPARTMENT Provider Note   CSN: [id number] Arrival date & time: 03/31/19  1524     History   Chief Complaint Chief Complaint  Patient presents with   Hyperglycemia    HPI Stephanie Combs is a 68 y.o. female.     Level 5 caveat for altered mental status.  EMS reports above chief complaint, but not much else information.  Initial glucose 540.  Allegedly, patient was given injection of insulin at home.  She has no specific complaints.  Chart review reveals a diagnosis of colon cancer plus small cell lung cancer plus of brain mets for which she is getting current therapy.     Past Medical History:  Diagnosis Date   Anxiety    CAD (coronary artery disease)    a. s/p DES to LAD and angioplasty to D1 in 05/2017   Cancer [facility])    CHF (congestive heart failure) (HCC)    Diastolic   Chronic back pain    Chronic pain    COPD (chronic obstructive pulmonary disease) (HCC)    Degenerative disk disease    Diabetes mellitus without complication ([place])    History of medication noncompliance 11/2017   "the patient frequently self-adjusts medications without notifying her physicians"   Hypertension    Hypothyroidism    On home O2    2.5 L N/C prn   Pedal edema     Patient Active Problem List   Diagnosis Date Noted   Acute encephalopathy 04/02/2019   Brain metastasis ([place]) 03/09/2019   Acute on chronic diastolic heart failure ([place]) 10/11/2018   Assistance needed with transportation 09/22/2018   Primary cancer of hepatic flexure of colon ([place])    Small cell lung cancer, left upper lobe ([place])    Colonic mass 09/10/2018   Colon polyps 09/10/2018   Cellulitis of leg, left 09/10/2018   Pain of lower extremity    Non-intractable vomiting    Abdominal pain, epigastric    Heme positive stool    Acute on chronic diastolic CHF (congestive heart failure) ([place]) 09/06/2018   Symptomatic anemia 08/31/2018   Anemia 08/30/2018    Chronic pain 07/05/2018   On home O2 07/05/2018   Opioid dependence ([place]) 07/05/2018   CAD (coronary artery disease) 05/14/2018   AKI (acute kidney injury) ([place]) 04/05/2018   Cellulitis 01/10/2018   Cellulitis of right lower extremity 01/07/2018   Altered mental status 01/06/2018   Type 2 diabetes mellitus with hypoglycemia without coma ([place]) 11/24/2017   Anxiety 11/24/2017   Chronic respiratory failure with hypoxia ([place]) 11/24/2017   Syncope 10/24/2017   Bradycardia    Syncope due to orthostatic hypotension 09/04/2017   Obesity, Class III, BMI 40-49.9 (morbid obesity) ([place]) 09/04/2017   Abnormal nuclear stress test 05/19/2017   Atypical angina ([place]) 05/19/2017   Edema 05/15/2017   Skin ulcer ([place]) 01/30/2017   Tinea cruris 10/10/2016   Vitamin D deficiency 08/19/2016   Personal history of noncompliance with medical treatment, presenting hazards to health 06/17/2016   CAP (community acquired pneumonia) 07/25/2015   Pressure ulcer 05/02/2015   Hyponatremia 05/01/2015   Acute-on-chronic kidney injury ([place]) 05/01/2015   Uncontrolled type 2 diabetes mellitus with stage 3 chronic kidney disease ([place]) 05/01/2015   Cellulitis of foot, right 03/24/2015   Peripheral edema 12/04/2014   Acute renal failure superimposed on stage 3 chronic kidney disease ([place]) 11/24/2014   Bilateral lower extremity edema    Diabetic ulcer of right great toe ([place])    Foot  ulcer due to secondary DM ([place]) 11/07/2014   Diabetic ulcer of right foot ([place]) 11/07/2014   Edema of lower extremity 05/27/2013   CKD (chronic kidney disease), stage III ([place]) 04/14/2013   Anemia in CKD (chronic kidney disease) 04/14/2013   Chest pain 04/14/2013   Dyspnea 04/14/2013   Chronic diastolic CHF (congestive heart failure) (HCC)    Hypothyroidism    COPD (chronic obstructive pulmonary disease) ([place]) 08/07/2012   Morbid obesity ([place]) 08/07/2012   DM type 2 (diabetes mellitus, type 2)  ([place]) 08/07/2012   HTN (hypertension), benign 08/07/2012   ARF (acute renal failure) ([place]) 08/07/2012    Past Surgical History:  Procedure Laterality Date   BIOPSY  09/09/2018   Procedure: BIOPSY;  Surgeon: Daneil Dolin, MD;  Location: [facility] ENDO SU[facility];  Service: Endoscopy;;  gastric esophageal hepatic flexure colon   COLONOSCOPY WITH PROPOFOL N/A 09/09/2018   Procedure: COLONOSCOPY WITH PROPOFOL;  Surgeon: Daneil Dolin, MD;  Location: [facility] END[facility];  Service: Endoscopy;  Laterality: N/A;   CORONARY STENT INTERVENTION N/A 05/22/2017   Procedure: CORONARY STENT INTERVENTION;  Surgeon: Martinique, Peter M, MD;  Location: [place] CV LAB;  Service: Cardiovascular;  Laterality: N/A;   ESOPHAGOGASTRODUODENOSCOPY (EGD) WITH PROPOFOL N/A 09/09/2018   Procedure: ESOPHAGOGASTRODUODENOSCOPY (EGD) WITH PROPOFOL;  Surgeon: Daneil Dolin, MD;  Location: [facility];  Service: Endoscopy;  Laterality: N/A;   HERNIA REPAIR     POLYPECTOMY  09/09/2018   Procedure: POLYPECTOMY;  Surgeon: Daneil Dolin, MD;  Location: [facility];  Service: Endoscopy;;  colon   RIGHT/LEFT HEART CATH AND CORONARY ANGIOGRAPHY N/A 05/19/2017   Procedure: RIGHT/LEFT HEART CATH AND CORONARY ANGIOGRAPHY;  Surgeon: Leonie Man, MD;  Location: [place] CV LAB;  Service: Cardiovascular;  Laterality: N/A;   TUBAL LIGATION     VIDEO BRONCHOSCOPY WITH ENDOBRONCHIAL ULTRASOUND Left 09/14/2018   Procedure: VIDEO BRONCHOSCOPY WITH ENDOBRONCHIAL ULTRASOUND with biopsies;  Surgeon: Margaretha Seeds, MD;  Location: [place];  Service: Thoracic;  Laterality: Left;     OB History   No obstetric history on file.      Home Medications    Prior to Admission medications   Medication Sig Start Date End Date Taking? Authorizing Provider  acetaminophen-codeine (TYLENOL #3) 300-30 MG tablet Take 1 tablet by mouth every 6 (six) hours as needed for moderate pain. 03/07/19  Yes Hayden Pedro, PA-C  albuterol [facility]) 108 (90 Base) MCG/ACT inhaler Inhale 2 puffs into the lungs every 4 (four) hours as needed for wheezing or shortness of breath. For shortness of breath 09/22/18  Yes Eugenie Filler, MD  albuterol (PROVENTIL) (2.5 MG/3ML) 0.083% nebulizer solution Take 2.5 mg by nebulization 2 (two) times daily as needed for wheezing or shortness of breath.    Yes [provider]  allopurinol (ZYLOPRIM) 300 MG tablet Take 300 mg by mouth daily.   Yes [provider]  ALPRAZolam Duanne Moron) 1 MG tablet Take 1 mg by mouth 3 (three) times daily as needed for anxiety or sleep.  01/05/17  Yes [provider]  aspirin EC 81 MG tablet Take 81 mg by mouth daily.   Yes [provider]  atorvastatin (LIPITOR) 80 MG tablet Take 1 tablet (80 mg total) by mouth daily at 6 PM. 05/26/17  Yes [name], MD  busPIRone (BUSPAR) 7.5 MG tablet Take 7.5 mg by mouth 2 (two) times daily. 03/10/19  Yes [provider]  cholecalciferol (VITAMIN D3) 25 MCG (1000  UT) tablet Take 5,000 Units by mouth daily.   Yes [provider]  diphenhydrAMINE-zinc acetate (BENADRYL) cream Apply 1 application topically 3 (three) times daily as needed for itching. 03/09/19  Yes Truitt Merle, MD  diphenoxylate-atropine (LOMOTIL) 2.5-0.025 MG tablet Take 1 tablet by mouth 4 (four) times daily as needed for diarrhea or loose stools.  01/03/19  Yes [provider]  furosemide (LASIX) 40 MG tablet Take 60 mg (1 1/2 tabs) in the morning and 40 mg (1 tab) in the afternoon Patient taking differently: Take 40 mg by mouth daily.  10/30/18  Yes Danford, Suann Larry, MD  gabapentin (NEURONTIN) 600 MG tablet Take 600 mg by mouth 3 (three) times daily.    Yes [provider]  hydrocortisone (ANUSOL-HC) 2.5 % rectal cream Place 1 application rectally 4 (four) times daily as needed for hemorrhoids.  01/03/19  Yes [provider]  Hydrocortisone (GERHARDT'S BUTT CREAM) CREA Apply 1 application  topically 4 (four) times daily as needed for irritation. 12/16/18  Yes Truitt Merle, MD  insulin aspart (NOVOLOG) 100 UNIT/ML injection Inject 1-18 Units into the skin 3 (three) times daily with meals. 04/09/18  Yes Johnson, Clanford L, MD  insulin detemir (LEVEMIR) 100 UNIT/ML injection Inject 0.4 mLs (40 Units total) into the skin at bedtime. 11/26/17  Yes Tat, Shanon Brow, MD  insulin lispro (HUMALOG) 100 UNIT/ML injection Inject 0-20 Units into the skin 3 (three) times daily with meals.  01/03/19  Yes [provider]  levothyroxine (SYNTHROID, LEVOTHROID) 200 MCG tablet Take 1 tablet by mouth daily before breakfast.  06/24/18  Yes [provider]  nitroGLYCERIN (NITROSTAT) 0.4 MG SL tablet Place 0.4 mg under the tongue every 5 (five) minutes as needed for chest pain.  03/26/18  Yes [provider]  nystatin (MYCOSTATIN/NYSTOP) powder Apply topically 2 (two) times daily. 02/22/19  Yes Alla Feeling, NP  Omega-3 Fatty Acids (FISH OIL PO) Take 1 capsule by mouth daily.    Yes [provider]  ondansetron (ZOFRAN) 8 MG tablet Take 1 tablet (8 mg total) by mouth every 8 (eight) hours as needed for nausea or vomiting. 12/15/18  Yes Truitt Merle, MD  ondansetron (ZOFRAN-ODT) 8 MG disintegrating tablet TAKE 1 TABLET BY MOUTH EVERY 8 HOURS AS NEEDED FOR NAUSEA OR VOMITING. 02/23/19  Yes Alla Feeling, NP  oxyCODONE (OXY IR/ROXICODONE) 5 MG immediate release tablet Take 1 tablet (5 mg total) by mouth every 12 (twelve) hours as needed for severe pain. 03/18/19  Yes Kyung Rudd, MD  oxyCODONE-acetaminophen (PERCOCET) 10-325 MG tablet Take 1 tablet by mouth every 6 (six) hours as needed for pain.   Yes [provider]  OXYGEN Inhale 2 L into the lungs continuous.   Yes [provider]  pantoprazole (PROTONIX) 40 MG tablet Take 1 tablet (40 mg total) by mouth 2 (two) times daily. 10/07/18  Yes Truitt Merle, MD  potassium chloride SA (KLOR-CON) 20 MEQ tablet Take 1 tablet (20 mEq  total) by mouth 2 (two) times daily. 03/09/19  Yes Truitt Merle, MD  prochlorperazine (COMPAZINE) 10 MG tablet Take 1 tablet (10 mg total) by mouth every 6 (six) hours as needed for nausea or vomiting. 11/24/18  Yes Truitt Merle, MD  promethazine (PHENERGAN) 12.5 MG tablet Take 1 tablet (12.5 mg total) by mouth every 6 (six) hours as needed for nausea or vomiting. 02/22/19  Yes Alla Feeling, NP  silver sulfADIAZINE (SILVADENE) 1 % cream Apply 1 application topically 2 (two) times daily.  01/03/19  Yes [provider]  sucralfate (CARAFATE) 1 g tablet Take 1 tablet (1 g total) by mouth 4 (four) times daily -  with meals and at bedtime. 5 min before meals for radiation induced esophagitis 12/03/18  Yes Tyler Pita, MD  tiZANidine (ZANAFLEX) 4 MG tablet Take 1 tablet (4 mg total) by mouth every 8 (eight) hours as needed for muscle spasms. 07/08/18  Yes Kathie Dike, MD  topiramate (TOPAMAX) 25 MG tablet Take 75 mg by mouth at bedtime.  03/18/18  Yes [provider]  umeclidinium-vilanterol (ANORO ELLIPTA) 62.5-25 MCG/INH AEPB Inhale 1 puff into the lungs daily. 05/26/17  Yes [name], MD  levofloxacin (LEVAQUIN) 750 MG tablet Take 1 tablet (750 mg total) by mouth every other day for 5 days. 04/03/19 04/08/19  Manuella Ghazi, Pratik D, DO  SURE COMFORT INS SYR 1CC/28G 28G X 1/2" 1 ML MISC USE TO INJECT INSULIN UP TO 4 TIMES DAILY. 03/06/17   Cassandria Anger, MD    Family History Family History  Problem Relation Age of Onset   Stroke Mother        deceased 75    Heart attack Father        deceased 87    Diabetes Brother    Cerebral palsy Brother    Pneumonia Brother    Diabetes Other    Heart attack Other    Colon cancer Neg Hx     Social History Social History   Tobacco Use   Smoking status: Former Smoker    Packs/day: 1.50    Years: 45.00    Pack years: 67.50    Types: Cigarettes    Quit date: 11/24/2014    Years since quitting: 4.3   Smokeless tobacco:  Never Used  Substance Use Topics   Alcohol use: No    Alcohol/week: 0.0 standard drinks   Drug use: No     Allergies   Dilaudid [hydromorphone hcl], Midodrine hcl, Actifed cold-allergy [chlorpheniramine-phenylephrine], Doxycycline, Other, Penicillins, Reglan [metoclopramide], Valium [diazepam], and Vistaril [hydroxyzine hcl]   Review of Systems Review of Systems  Unable to perform ROS: Mental status change     Physical Exam Updated Vital Signs BP 126/70 (BP Location: Right Arm)    Pulse 76    Temp 98.2 F (36.8 C) (Oral)    Resp 16    Ht 5' 6.93" (1.7 m)    Wt 122.3 kg    SpO2 97%    BMI 42.32 kg/m   Physical Exam Vitals signs and nursing note reviewed.  Constitutional:      Appearance: She is well-developed.     Comments: Elevated BMI, conversant, confused.  HENT:     Head: Normocephalic and atraumatic.  Eyes:     Conjunctiva/sclera: Conjunctivae normal.  Neck:     Musculoskeletal: Neck supple.  Cardiovascular:     Rate and Rhythm: Normal rate and regular rhythm.  Pulmonary:     Effort: Pulmonary effort is normal.     Breath sounds: Normal breath sounds.  Abdominal:     General: Bowel sounds are normal.     Palpations: Abdomen is soft.  Musculoskeletal: Normal range of motion.  Skin:    General: Skin is warm and dry.  Neurological:     General: No focal deficit present.     Mental Status: She is oriented to person, place, and time.  Psychiatric:        Behavior: Behavior normal.      ED Treatments / Results  Labs (all labs ordered are listed, but only abnormal results are displayed) Labs Reviewed  LACTIC ACID, PLASMA - Abnormal; Notable for the following components:      Result Value   Lactic Acid, Venous 2.2 (*)    All other components within normal limits  LACTIC ACID, PLASMA - Abnormal; Notable for the following components:   Lactic Acid, Venous 2.6 (*)    All other components within normal limits  CBC WITH DIFFERENTIAL/PLATELET - Abnormal;  Notable for the following components:   RBC 3.47 (*)    Hemoglobin 10.8 (*)    HCT 35.5 (*)    MCV 102.3 (*)    Lymphs Abs 0.4 (*)    All other components within normal limits  COMPREHENSIVE METABOLIC PANEL - Abnormal; Notable for the following components:   Glucose, Bld 270 (*)    BUN 31 (*)    Creatinine, Ser 2.22 (*)    AST 12 (*)    GFR calc non Af Amer 22 (*)    GFR calc Af Amer 25 (*)    All other components within normal limits  URINALYSIS, ROUTINE W REFLEX MICROSCOPIC - Abnormal; Notable for the following components:   APPearance HAZY (*)    Glucose, UA 50 (*)    Hgb urine dipstick MODERATE (*)    Ketones, ur 5 (*)    Protein, ur 100 (*)    Bacteria, UA RARE (*)    All other components within normal limits  RAPID URINE DRUG SCREEN, HOSP PERFORMED - Abnormal; Notable for the following components:   Opiates POSITIVE (*)    Benzodiazepines POSITIVE (*)    All other components within normal limits  COMPREHENSIVE METABOLIC PANEL - Abnormal; Notable for the following components:   Glucose, Bld 100 (*)    BUN 31 (*)    Creatinine, Ser 1.90 (*)    Calcium 8.8 (*)    AST 10 (*)    GFR calc non Af Amer 26 (*)    GFR calc Af Amer 31 (*)    All other components within normal limits  CBC - Abnormal; Notable for the following components:   RBC 3.35 (*)    Hemoglobin 10.4 (*)    HCT 34.1 (*)    MCV 101.8 (*)    All other components within normal limits  SEDIMENTATION RATE - Abnormal; Notable for the following components:   Sed Rate 40 (*)    All other components within normal limits  GLUCOSE, CAPILLARY - Abnormal; Notable for the following components:   Glucose-Capillary 218 (*)    All other components within normal limits  GLUCOSE, CAPILLARY - Abnormal; Notable for the following components:   Glucose-Capillary 127 (*)    All other components within normal limits  GLUCOSE, CAPILLARY - Abnormal; Notable for the following components:   Glucose-Capillary 149 (*)    All  other components within normal limits  GLUCOSE, CAPILLARY - Abnormal; Notable for the following components:   Glucose-Capillary 265 (*)    All other components within normal limits  BASIC METABOLIC PANEL - Abnormal; Notable for the following components:   Glucose, Bld 222 (*)    Creatinine, Ser 1.48 (*)    GFR calc non Af Amer 36 (*)    GFR calc Af Amer 41 (*)    All other components within normal limits  CBC - Abnormal; Notable for the following components:   RBC 3.62 (*)    Hemoglobin 11.2 (*)    All other components within normal  limits  GLUCOSE, CAPILLARY - Abnormal; Notable for the following components:   Glucose-Capillary 228 (*)    All other components within normal limits  GLUCOSE, CAPILLARY - Abnormal; Notable for the following components:   Glucose-Capillary 199 (*)    All other components within normal limits  GLUCOSE, CAPILLARY - Abnormal; Notable for the following components:   Glucose-Capillary 258 (*)    All other components within normal limits  GLUCOSE, CAPILLARY - Abnormal; Notable for the following components:   Glucose-Capillary 226 (*)    All other components within normal limits  CBG MONITORING, ED - Abnormal; Notable for the following components:   Glucose-Capillary 389 (*)    All other components within normal limits  CBG MONITORING, ED - Abnormal; Notable for the following components:   Glucose-Capillary 104 (*)    All other components within normal limits  CBG MONITORING, ED - Abnormal; Notable for the following components:   Glucose-Capillary 126 (*)    All other components within normal limits  CBG MONITORING, ED - Abnormal; Notable for the following components:   Glucose-Capillary 215 (*)    All other components within normal limits  CULTURE, BLOOD (ROUTINE X 2)  CULTURE, BLOOD (ROUTINE X 2)  SARS CORONAVIRUS 2 (TAT 6-24 HRS)  HIV ANTIBODY (ROUTINE TESTING W REFLEX)  VITAMIN B12  TSH  RPR  PROCALCITONIN  AMMONIA  LACTIC ACID, PLASMA    PROCALCITONIN  CBG MONITORING, ED    EKG EKG Interpretation  Date/Time:  Thursday March 31 2019 15:53:17 EST Ventricular Rate:  53 PR Interval:    QRS Duration: 100 QT Interval:  493 QTC Calculation: 463 R Axis:   79 Text Interpretation: Sinus rhythm Nonspecific T abnrm, anterolateral leads Confirmed by Nat Christen [phone]) on 03/31/2019 4:42:14 PM   Radiology No results found.  Procedures Procedures (including critical care time)  Medications Ordered in ED Medications  0.9 %  sodium chloride infusion ( Intravenous Stopped 04/01/19 1038)  acetaminophen (TYLENOL) tablet 650 mg (has no administration in time range)    Or  acetaminophen (TYLENOL) suppository 650 mg (has no administration in time range)  insulin aspart (novoLOG) injection 0-6 Units (2 Units Subcutaneous Given 04/03/19 1736)  insulin aspart (novoLOG) injection 0-5 Units (2 Units Subcutaneous Given 04/02/19 2253)  allopurinol (ZYLOPRIM) tablet 300 mg (300 mg Oral Given 04/03/19 0806)  aspirin EC tablet 81 mg (81 mg Oral Given 04/03/19 0806)  atorvastatin (LIPITOR) tablet 80 mg (80 mg Oral Given 04/03/19 1608)  ALPRAZolam (XANAX) tablet 1 mg (1 mg Oral Given 04/02/19 2248)  prochlorperazine (COMPAZINE) tablet 10 mg (has no administration in time range)  insulin detemir (LEVEMIR) injection 40 Units (40 Units Subcutaneous Given 04/02/19 2247)  levothyroxine (SYNTHROID) tablet 200 mcg (200 mcg Oral Given 04/03/19 0656)  pantoprazole (PROTONIX) EC tablet 40 mg (40 mg Oral Given 04/03/19 0806)  polyethylene glycol (MIRALAX / GLYCOLAX) packet 17 g (17 g Oral Given 04/03/19 0818)  senna (SENOKOT) tablet 17.2 mg (17.2 mg Oral Given 04/03/19 1605)  sucralfate (CARAFATE) tablet 1 g (1 g Oral Given 04/03/19 1605)  gabapentin (NEURONTIN) capsule 600 mg (600 mg Oral Given 04/03/19 1416)  tiZANidine (ZANAFLEX) tablet 4 mg (has no administration in time range)  topiramate (TOPAMAX) tablet 75 mg (75 mg Oral Given 04/02/19  2247)  potassium chloride SA (KLOR-CON) CR tablet 20 mEq (20 mEq Oral Given 04/03/19 0806)  albuterol (PROVENTIL) (2.5 MG/3ML) 0.083% nebulizer solution 3 mL (has no administration in time range)  umeclidinium-vilanterol (ANORO ELLIPTA) 62.5-25 MCG/INH 1  puff (1 puff Inhalation Not Given 04/03/19 1021)  hydrocortisone (ANUSOL-HC) 2.5 % rectal cream 1 application (has no administration in time range)  Gerhardt's butt cream 1 application (1 application Topical Given 04/02/19 2255)  nystatin (MYCOSTATIN/NYSTOP) topical powder ( Topical Given 04/03/19 0814)  silver sulfADIAZINE (SILVADENE) 1 % cream 1 application (1 application Topical Not Given 04/03/19 1148)  levofloxacin (LEVAQUIN) IVPB 750 mg (750 mg Intravenous New Bag/Given 04/03/19 0051)  ondansetron (ZOFRAN) injection 4 mg (4 mg Intravenous Given 04/01/19 1358)  oxyCODONE-acetaminophen (PERCOCET/ROXICET) 5-325 MG per tablet 1 tablet (1 tablet Oral Given 04/03/19 1419)    And  oxyCODONE (Oxy IR/ROXICODONE) immediate release tablet 5 mg (has no administration in time range)  alum & mag hydroxide-simeth (MAALOX/MYLANTA) 200-200-20 MG/5ML suspension 30 mL (30 mLs Oral Given 04/01/19 2235)  lactated ringers infusion ( Intravenous New Bag/Given 04/03/19 0657)  heparin injection 5,000 Units (5,000 Units Subcutaneous Given 04/03/19 1415)  0.9 %  sodium chloride infusion ( Intravenous Stopped 03/31/19 2101)  0.9 %  sodium chloride infusion ( Intravenous Stopped 03/31/19 2159)  ceFEPIme (MAXIPIME) 2 g in sodium chloride 0.9 % 100 mL IVPB (0 g Intravenous Stopped 03/31/19 2101)  vancomycin (VANCOCIN) IVPB 1000 mg/200 mL premix (0 mg Intravenous Stopped 03/31/19 2159)     Initial Impression / Assessment and Plan / ED Course  I have reviewed the triage vital signs and the nursing notes.  Pertinent labs & imaging results that were available during my care of the patient were reviewed by me and considered in my medical decision making (see chart for  details).       Uncertain etiology of patient's chief complaint.  Will initiate typical tests.  Diagnosis of sepsis not readily apparent to me on initial work-up.  However, IV fluids and IV antibiotics were ordered.  Daughter confirms that patient's behavior is abnormal.  Will admit for further evaluation.   CRITICAL CARE Performed by: Nat Christen Total critical care time: 30 minutes Critical care time was exclusive of separately billable procedures and treating other patients. Critical care was necessary to treat or prevent imminent or life-threatening deterioration. Critical care was time spent personally by me on the following activities: development of treatment plan with patient and/or surrogate as well as nursing, discussions with consultants, evaluation of patient's response to treatment, examination of patient, obtaining history from patient or surrogate, ordering and performing treatments and interventions, ordering and review of laboratory studies, ordering and review of radiographic studies, pulse oximetry and re-evaluation of patient's condition.   Final Clinical Impressions(s) / ED Diagnoses   Final diagnoses:  Altered mental status, unspecified altered mental status type    ED Discharge Orders         Ordered    levofloxacin (LEVAQUIN) 750 MG tablet  Every 48 hours     04/03/19 1033    Increase activity slowly     04/03/19 1033    Diet - low sodium heart healthy     04/03/19 1033           Nat Christen, MD 03/31/19 1658    Nat Christen, MD 04/03/19 2030

## 2019-03-31 NOTE — H&P (Addendum)
TRH H&P    Patient Demographics:    Stephanie Combs, is a 68 y.o. female  MRN: [id number]  DOB - 08/21/1950  Admit Date - 03/31/2019  Referring MD/NP/PA:   Nat Christen  Outpatient Primary MD for the patient is Stephanie Gravel, MD  Patient coming from:  home  Chief complaint- altered mental status   HPI:    Stephanie Combs  is a 68 y.o. 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  Pt states slight dry cough.  Pt denies fever, chills, cp, palp, sob beyond her baseline, abd pain, diarrhea, brbpr, dysuria.  Pt is axoxo2 (person, place), pt knows her oncologist name and also that she doesn't want radiation. Pt perseverates on this.   In ED,  T 97.8, P 53, R 14, Bp 99.35,  Bp 91/49 pox 94% on 4L ,  Wt 128kg   Wbc 7.2, Hgb 10.8, Plt 244 Na 137, K 3.8, Bun 31, Creatinine 2.22 Ast 12, Alt 10, Alk phos 98, T. Bili 0.9 Urinalysis pending Blood culture x2 pending Lactic acid 2.6  CT brain  IMPRESSION: No acute intracranial process.  Atrophy and chronic microvascular ischemic changes.  CXR IMPRESSION: Patchy density in the left upper lung that may reflect atelectasis/consolidation, potentially postobstructive given hilar mass on prior imaging.  Pt received vanco iv, cefepime iv in ED.   Pt will be admitted for AMS     Review of systems:    In addition to the HPI above,  No Fever-chills, No Headache, No changes with Vision or hearing, No problems swallowing food or Liquids, No Chest pain,   No Abdominal pain, No Nausea or Vomiting, bowel movements are regular, No Blood in stool or Urine, No dysuria, No new skin rashes or bruises, No new joints pains-aches,   No new weakness, tingling, numbness in any extremity, No recent weight gain or loss, No polyuria, polydypsia or polyphagia, No significant Mental Stressors.  All other systems reviewed and are negative.    Past History of the following :    Past Medical History:  Diagnosis Date  . Anxiety   . CAD (coronary artery disease)    a. s/p DES to LAD and angioplasty to D1 in 05/2017  . CHF (congestive heart failure) (HCC)    Diastolic  . Chronic back pain   . Chronic pain   . COPD (chronic obstructive pulmonary disease) ([place])   . Degenerative disk disease   . Diabetes mellitus without complication ([place])   . History of medication noncompliance 11/2017   "the patient frequently self-adjusts medications without notifying her physicians"  . Hypertension   . Hypothyroidism   . On home O2    2.5 L N/C prn  . Pedal edema       Past Surgical History:  Procedure Laterality Date  . BIOPSY  09/09/2018   Procedure: BIOPSY;  Surgeon: Daneil Dolin, MD;  [facility];  Service: Endoscopy;;  gastric esophageal hepatic flexure colon  . COLONOSCOPY WITH PROPOFOL N/A 09/09/2018   Procedure: COLONOSCOPY WITH PROPOFOL;  Surgeon: Daneil Dolin, MD;  [facility];  Service: Endoscopy;  Laterality: N/A;  . CORONARY STENT INTERVENTION N/A 05/22/2017   Procedure: CORONARY STENT INTERVENTION;  Surgeon: Martinique, Peter M, MD;  Location: [facility];  Service: Cardiovascular;  Laterality: N/A;  . ESOPHAGOGASTRODUODENOSCOPY (EGD) WITH PROPOFOL N/A 09/09/2018   Procedure: ESOPHAGOGASTRODUODENOSCOPY (EGD) WITH PROPOFOL;  Surgeon: Daneil Dolin, MD;  [facility];  Service: Endoscopy;  Laterality: N/A;  . HERNIA REPAIR    . POLYPECTOMY  09/09/2018   Procedure: POLYPECTOMY;  Surgeon: Daneil Dolin, MD;  [facility];  Service: Endoscopy;;  colon  . RIGHT/LEFT HEART CATH AND CORONARY ANGIOGRAPHY N/A 05/19/2017   Procedure: RIGHT/LEFT HEART CATH AND CORONARY  ANGIOGRAPHY;  Surgeon: Leonie Man, MD;  Location: [facility];  Service: Cardiovascular;  Laterality: N/A;  . TUBAL LIGATION    . VIDEO BRONCHOSCOPY WITH ENDOBRONCHIAL ULTRASOUND Left 09/14/2018   Procedure: VIDEO BRONCHOSCOPY WITH ENDOBRONCHIAL ULTRASOUND with biopsies;  Surgeon: Margaretha Seeds, MD;  Location: [facility];  Service: Thoracic;  Laterality: Left;      Social History:      Social History   Tobacco Use  . Smoking status: Former Smoker    Packs/day: 1.50    Years: 45.00    Pack years: 67.50    Types: Cigarettes    Quit date: 11/24/2014    Years since quitting: 4.3  . Smokeless tobacco: Never Used  Substance Use Topics  . Alcohol use: No    Alcohol/week: 0.0 standard drinks       Family History :     Family History  Problem Relation Age of Onset  . Stroke Mother        deceased 44   . Heart attack Father        deceased 9   . Diabetes Brother   . Cerebral palsy Brother   . Pneumonia Brother   . Diabetes Other   . Heart attack Other   . Colon cancer Neg Hx        Home Medications:   Prior to Admission medications   Medication Sig Start Date End Date Taking? Authorizing Provider  allopurinol (ZYLOPRIM) 300 MG tablet Take 300 mg by mouth daily.   Yes [provider]  ALPRAZolam Duanne Moron) 1 MG tablet Take 1 mg by mouth 3 (three) times daily as needed for anxiety or sleep.  01/05/17  Yes [provider]  diphenoxylate-atropine (LOMOTIL) 2.5-0.025 MG tablet Take 1 tablet by mouth 4 (four) times daily as needed for diarrhea or loose stools.  01/03/19  Yes [provider]  gabapentin (NEURONTIN) 600 MG tablet Take 600 mg by mouth 3 (three) times daily.    Yes [provider]  insulin detemir (LEVEMIR) 100 UNIT/ML injection Inject 0.4 mLs (40 Units total) into the skin at bedtime. 11/26/17  Yes Tat, Shanon Brow, MD  insulin lispro (HUMALOG) 100 UNIT/ML injection Inject 0-20 Units into the skin 3 (three) times daily with meals.   01/03/19  Yes [provider]  levothyroxine (SYNTHROID, LEVOTHROID) 200 MCG tablet Take 1 tablet by mouth daily before breakfast.  06/24/18  Yes [provider]  nystatin (MYCOSTATIN/NYSTOP) powder Apply topically 2 (two) times daily. 02/22/19  Yes  Alla Feeling, NP  oxyCODONE (OXY IR/ROXICODONE) 5 MG immediate release tablet Take 1 tablet (5 mg total) by mouth every 12 (twelve) hours as needed for severe pain. 03/18/19  Yes Kyung Rudd, MD  oxyCODONE-acetaminophen (PERCOCET) 10-325 MG tablet Take 1 tablet by mouth every 6 (six) hours as needed for pain.   Yes [provider]  OXYGEN Inhale 2 L into the lungs continuous.   Yes [provider]  pantoprazole (PROTONIX) 40 MG tablet Take 1 tablet (40 mg total) by mouth 2 (two) times daily. 10/07/18  Yes Truitt Merle, MD  potassium chloride SA (KLOR-CON) 20 MEQ tablet Take 1 tablet (20 mEq total) by mouth 2 (two) times daily. 03/09/19  Yes Truitt Merle, MD  promethazine (PHENERGAN) 12.5 MG tablet Take 1 tablet (12.5 mg total) by mouth every 6 (six) hours as needed for nausea or vomiting. 02/22/19  Yes Alla Feeling, NP  tiZANidine (ZANAFLEX) 4 MG tablet Take 1 tablet (4 mg total) by mouth every 8 (eight) hours as needed for muscle spasms. 07/08/18  Yes Kathie Dike, MD  topiramate (TOPAMAX) 25 MG tablet Take 75 mg by mouth at bedtime.  03/18/18  Yes [provider]  acetaminophen-codeine (TYLENOL #3) 300-30 MG tablet Take 1 tablet by mouth every 6 (six) hours as needed for moderate pain. 03/07/19   Hayden Pedro, PA-C  albuterol [facility] HFA) 108 [phone] Base) MCG/ACT inhaler Inhale 2 puffs into the lungs every 4 (four) hours as needed for wheezing or shortness of breath. For shortness of breath 09/22/18   Eugenie Filler, MD  albuterol (PROVENTIL) (2.5 MG/3ML) 0.083% nebulizer solution Take 2.5 mg by nebulization 2 (two) times daily as needed for wheezing or shortness of breath.     [provider]   allopurinol (ZYLOPRIM) 300 MG tablet Take 1 tablet (300 mg total) by mouth daily for 15 days. 12/15/18 12/30/18  Truitt Merle, MD  aspirin EC 81 MG tablet Take 81 mg by mouth daily.    [provider]  atorvastatin (LIPITOR) 80 MG tablet Take 1 tablet (80 mg total) by mouth daily at 6 PM. 05/26/17   Stephanie Gravel, MD  cholecalciferol (VITAMIN D3) 25 MCG (1000 UT) tablet Take 5,000 Units by mouth daily.    [provider]  diphenhydrAMINE-zinc acetate (BENADRYL) cream Apply 1 application topically 3 (three) times daily as needed for itching. 03/09/19   Truitt Merle, MD  furosemide (LASIX) 20 MG tablet Take 20 mg by mouth every evening.    [provider]  furosemide (LASIX) 40 MG tablet Take 60 mg (1 1/2 tabs) in the morning and 40 mg (1 tab) in the afternoon Patient taking differently: Take 40 mg by mouth daily.  10/30/18   Danford, Suann Larry, MD  hydrocortisone (ANUSOL-HC) 2.5 % rectal cream Place 1 application rectally 4 (four) times daily as needed for hemorrhoids.  01/03/19   [provider]  Hydrocortisone (GERHARDT'S BUTT CREAM) CREA Apply 1 application topically 4 (four) times daily as needed for irritation. 12/16/18   Truitt Merle, MD  insulin aspart (NOVOLOG) 100 UNIT/ML injection Inject 1-18 Units into the skin 3 (three) times daily with meals. 04/09/18   Johnson, Clanford L, MD  nitroGLYCERIN (NITROSTAT) 0.4 MG SL tablet Place 0.4 mg under the tongue every 5 (five) minutes as needed for chest pain.  03/26/18   [provider]  Omega-3 Fatty Acids (FISH OIL PO) Take 1 capsule by mouth daily.     [provider]  ondansetron (ZOFRAN) 8 MG tablet Take 1 tablet (8 mg total) by mouth every 8 (eight) hours as needed for nausea or vomiting. 12/15/18   Truitt Merle, MD  ondansetron (ZOFRAN-ODT) 8 MG disintegrating tablet TAKE 1 TABLET BY MOUTH EVERY 8 HOURS AS NEEDED FOR NAUSEA OR VOMITING. 02/23/19   Alla Feeling, NP  polyethylene glycol (MIRALAX / GLYCOLAX) 17  g packet Take 17 g by mouth 2 (two) times daily. 09/21/18   Arrien, Jimmy Picket, MD  potassium chloride (MICRO-K) 10 MEQ CR capsule Take 2 capsules (20 mEq total) by mouth 2 (two) times daily. *May take one additional tablet as needed for cramping 04/23/18   Herminio Commons, MD  prochlorperazine (COMPAZINE) 10 MG tablet Take 1 tablet (10 mg total) by mouth every 6 (six) hours as needed for nausea or vomiting. 11/24/18   Truitt Merle, MD  senna (SENOKOT) 8.6 MG TABS tablet Take 2 tablets (17.2 mg total) by mouth 2 (two) times daily with a meal. 09/21/18   Arrien, Jimmy Picket, MD  silver sulfADIAZINE (SILVADENE) 1 % cream Apply 1 application topically 2 (two) times daily.  01/03/19   [provider]  sucralfate (CARAFATE) 1 g tablet Take 1 tablet (1 g total) by mouth 4 (four) times daily -  with meals and at bedtime. 5 min before meals for radiation induced esophagitis 12/03/18   Tyler Pita, MD  SURE COMFORT INS SYR 1CC/28G 28G X 1/2" 1 ML MISC USE TO INJECT INSULIN UP TO 4 TIMES DAILY. 03/06/17   Cassandria Anger, MD  umeclidinium-vilanterol (ANORO ELLIPTA) 62.5-25 MCG/INH AEPB Inhale 1 puff into the lungs daily. 05/26/17   Stephanie Gravel, MD     Allergies:     Allergies  Allergen Reactions  . Dilaudid [Hydromorphone Hcl] Itching  . Midodrine Hcl Swelling    After one dose had anaphylactic reaction, had to call EMS.  Marland Kitchen Actifed Cold-Allergy [Chlorpheniramine-Phenylephrine]     "I was sick and red and it didn't agree with me at all"  . Doxycycline Nausea And Vomiting  . Other Cough    Pt states she is allergic to ragweed and that she starts coughing and sneezing like crazy  . Penicillins Hives and Itching    Tolerates Rocephin Has patient had a PCN reaction causing immediate rash, facial/tongue/throat swelling, SOB or lightheadedness with hypotension: Yes Has patient had a PCN reaction causing severe rash involving mucus membranes or skin necrosis: No Has patient had a PCN  reaction that required hospitalization Yes Has patient had a PCN reaction occurring within the last 10 years: No If all of the above answers are "NO", then may proceed with Cephalosporin use.   . Reglan [Metoclopramide] Itching  . Valium [Diazepam] Itching  . Vistaril [Hydroxyzine Hcl] Itching     Physical Exam:   Vitals  Blood pressure (!) 91/49, pulse 77, temperature 97.8 F (36.6 C), temperature source Oral, resp. rate 16, height 5' 6.93" (1.7 m), weight 128 kg, SpO2 100 %.  1.  General: axoxo3  2. Psychiatric: euthymic  3. Neurologic: Cn 2-12 intact, reflexes 2+ symmetric, diffuse with no clonus, motor 5/5 in all 4 ext  4. HEENMT:  Anicteric, pupils 1.53m symmetric, direct, consensual, intact Neck: no jvd  5. Respiratory : Slight crackles at the left lung base, no wheezing  6. Cardiovascular : rrr s1, s2,   7. Gastrointestinal:  Abd: soft, nt, nd, +bs  8. Skin:  Ext: no c/c/e, no rash  9.Musculoskeletal:  Good ROM  Data Review:    CBC Recent Labs  Lab 03/31/19 1608  WBC 7.2  HGB 10.8*  HCT 35.5*  PLT 244  MCV 102.3*  MCH 31.1  MCHC 30.4  RDW 14.4  LYMPHSABS 0.4*  MONOABS 0.3  EOSABS 0.1  BASOSABS 0.1   ------------------------------------------------------------------------------------------------------------------  Results for orders placed or performed during the hospital encounter of 03/31/19 (from the past 48 hour(s))  CBG monitoring, ED     Status: Abnormal   Collection Time: 03/31/19  3:34 PM  Result Value Ref Range   Glucose-Capillary 389 (H) 70 - 99 mg/dL  Blood culture (routine x 2)     Status: None (Preliminary result)   Collection Time: 03/31/19  4:07 PM   Specimen: Peripheral; Blood  Result Value Ref Range   Specimen Description RIGHT ANTECUBITAL    Special Requests      BOTTLES DRAWN AEROBIC AND ANAEROBIC Blood Culture adequate volume Performed at [facility], [address] [place], [place] [address]    Culture  PENDING    Report Status PENDING   Lactic acid, plasma     Status: Abnormal   Collection Time: 03/31/19  4:07 PM  Result Value Ref Range   Lactic Acid, Venous 2.2 (HH) 0.5 - 1.9 mmol/L    Comment: CRITICAL RESULT CALLED TO, READ BACK BY AND VERIFIED WITH: WESTON,L ON 03/31/19 AT 1745 BY LOY,C Performed at [facility], [address] [place], [place] [address]   CBC with Differential     Status: Abnormal   Collection Time: 03/31/19  4:08 PM  Result Value Ref Range   WBC 7.2 4.0 - 10.5 K/uL   RBC 3.47 (L) 3.87 - 5.11 MIL/uL   Hemoglobin 10.8 (L) 12.0 - 15.0 g/dL   HCT 35.5 (L) 36.0 - 46.0 %   MCV 102.3 (H) 80.0 - 100.0 fL   MCH 31.1 26.0 - 34.0 pg   MCHC 30.4 30.0 - 36.0 g/dL   RDW 14.4 11.5 - 15.5 %   Platelets 244 150 - 400 K/uL   nRBC 0.0 0.0 - 0.2 %   Neutrophils Relative % 89 %   Neutro Abs 6.3 1.7 - 7.7 K/uL   Lymphocytes Relative 5 %   Lymphs Abs 0.4 (L) 0.7 - 4.0 K/uL   Monocytes Relative 4 %   Monocytes Absolute 0.3 0.1 - 1.0 K/uL   Eosinophils Relative 1 %   Eosinophils Absolute 0.1 0.0 - 0.5 K/uL   Basophils Relative 1 %   Basophils Absolute 0.1 0.0 - 0.1 K/uL   Immature Granulocytes 0 %   Abs Immature Granulocytes 0.03 0.00 - 0.07 K/uL    Comment: Performed at [facility], [address], [place], [place] [address]  Comprehensive metabolic panel     Status: Abnormal   Collection Time: 03/31/19  4:08 PM  Result Value Ref Range   Sodium 137 135 - 145 mmol/L   Potassium 3.8 3.5 - 5.1 mmol/L   Chloride 101 98 - 111 mmol/L   CO2 28 22 - 32 mmol/L   Glucose, Bld 270 (H) 70 - 99 mg/dL   BUN 31 (H) 8 - 23 mg/dL   Creatinine, Ser 2.22 (H) 0.44 - 1.00 mg/dL   Calcium 9.3 8.9 - 10.3 mg/dL   Total Protein 6.8 6.5 - 8.1 g/dL   Albumin 3.6 3.5 - 5.0 g/dL   AST 12 (L) 15 - 41 U/L   ALT 10 0 - 44 U/L   Alkaline Phosphatase 98 38 - 126 U/L  Total Bilirubin 0.9 0.3 - 1.2 mg/dL   GFR calc non Af Amer 22 (L) >60 mL/min   GFR calc Af Amer 25 (L) >60 mL/min   Anion gap 8  5 - 15    Comment: Performed at [facility], [address] [place], [place] [address]  Blood culture (routine x 2)     Status: None (Preliminary result)   Collection Time: 03/31/19  4:25 PM   Specimen: Peripheral; Blood  Result Value Ref Range   Specimen Description BLOOD LEFT HAND    Special Requests      BOTTLES DRAWN AEROBIC AND ANAEROBIC Blood Culture adequate volume Performed at [facility], [address], [place], [place] [address]    Culture PENDING    Report Status PENDING   Lactic acid, plasma     Status: Abnormal   Collection Time: 03/31/19  5:50 PM  Result Value Ref Range   Lactic Acid, Venous 2.6 (HH) 0.5 - 1.9 mmol/L    Comment: CRITICAL VALUE NOTED.  VALUE IS CONSISTENT WITH PREVIOUSLY REPORTED AND CALLED VALUE. Performed at [facility], [address], [place], [place] [address]     Chemistries  Recent Labs  Lab 03/31/19 1608  NA 137  K 3.8  CL 101  CO2 28  GLUCOSE 270*  BUN 31*  CREATININE 2.22*  CALCIUM 9.3  AST 12*  ALT 10  ALKPHOS 98  BILITOT 0.9   ------------------------------------------------------------------------------------------------------------------  ------------------------------------------------------------------------------------------------------------------ GFR: Estimated Creatinine Clearance: 33.2 mL/min (A) (by C-G formula based on SCr of 2.22 mg/dL (H)). Liver Function Tests: Recent Labs  Lab 03/31/19 1608  AST 12*  ALT 10  ALKPHOS 98  BILITOT 0.9  PROT 6.8  ALBUMIN 3.6   No results for input(s): LIPASE, AMYLASE in the last 168 hours. No results for input(s): AMMONIA in the last 168 hours. Coagulation Profile: No results for input(s): INR, PROTIME in the last 168 hours. Cardiac Enzymes: No results for input(s): CKTOTAL, CKMB, CKMBINDEX, TROPONINI in the last 168 hours. BNP (last 3 results) No results for input(s): PROBNP in the last 8760 hours. HbA1C: No results for input(s): HGBA1C in the last 72 hours. CBG:  Recent Labs  Lab 03/31/19 1534  GLUCAP 389*   Lipid Profile: No results for input(s): CHOL, HDL, LDLCALC, TRIG, CHOLHDL, LDLDIRECT in the last 72 hours. Thyroid Function Tests: No results for input(s): TSH, T4TOTAL, FREET4, T3FREE, THYROIDAB in the last 72 hours. Anemia Panel: No results for input(s): VITAMINB12, FOLATE, FERRITIN, TIBC, IRON, RETICCTPCT in the last 72 hours.  --------------------------------------------------------------------------------------------------------------- Urine analysis:    Component Value Date/Time   COLORURINE YELLOW 01/21/2019 0026   APPEARANCEUR CLEAR 01/21/2019 0026   LABSPEC 1.004 (L) 01/21/2019 0026   PHURINE 5.0 01/21/2019 0026   GLUCOSEU NEGATIVE 01/21/2019 0026   HGBUR MODERATE (A) 01/21/2019 0026   BILIRUBINUR NEGATIVE 01/21/2019 0026   KETONESUR NEGATIVE 01/21/2019 0026   PROTEINUR NEGATIVE 01/21/2019 0026   UROBILINOGEN 0.2 12/04/2014 0650   NITRITE NEGATIVE 01/21/2019 0026   LEUKOCYTESUR NEGATIVE 01/21/2019 0026      Imaging Results:    Ct Head Wo Contrast  Result Date: 03/31/2019 CLINICAL DATA:  Patient with altered mental status.  Colon cancer. EXAM: CT HEAD WITHOUT CONTRAST TECHNIQUE: Contiguous axial images were obtained from the base of the skull through the vertex without intravenous contrast. COMPARISON:  Brain CT 01/06/2018. FINDINGS: Brain: Ventricles and sulci are prominent compatible with atrophy. Periventricular and subcortical white matter hypodensities compatible with chronic microvascular ischemic changes. No evidence for acute cortically  based infarct, intracranial hemorrhage, mass lesion or mass-effect. Vascular: Unremarkable Skull: Intact. Sinuses/Orbits: Paranasal sinuses are well aerated. Mastoid air cells are unremarkable. Orbits are unremarkable. Other: None. IMPRESSION: No acute intracranial process. Atrophy and chronic microvascular ischemic changes. Electronically Signed   By: Lovey Newcomer M.D.   On: 03/31/2019  18:28   Dg Chest Port 1 View  Result Date: 03/31/2019 CLINICAL DATA:  Altered mental status EXAM: PORTABLE CHEST 1 VIEW COMPARISON:  September 2020 FINDINGS: Right lung is clear. Left hilar mass is not well evaluated. Patchy increased density in the left upper lung. No pleural effusion. No pneumothorax. Normal heart size for portable technique. IMPRESSION: Patchy density in the left upper lung that may reflect atelectasis/consolidation, potentially postobstructive given hilar mass on prior imaging. Electronically Signed   By: Macy Mis M.D.   On: 03/31/2019 17:04      Assessment & Plan:    Active Problems:   DM type 2 (diabetes mellitus, type 2) (HCC)   HTN (hypertension), benign   ARF (acute renal failure) (HCC)   Hypothyroidism   Uncontrolled type 2 diabetes mellitus with stage 3 chronic kidney disease (HCC)   Altered mental status  AMS  ? Secondary to metastatic cancer to brain Check b12, esr, rpr, tsh Check MRI brain Please f/u on urinalysis  ARF Hydrate with ns iv Check cmp in am, if not improving consider renal ultrasound  CAP Blood culture x2 Urine strep antigen Urine legionella antigen Covid-19 test pending, PUI Levaquin pharmacy to dose  Small cell lung carcinoma/ colon cancer Consider palliative care consult F/u with Dr. Burr Medico as outpatient  Dm2/ neuropathy Cont Gabapentin  Cont Levemir 40 units [place] qhs,  Fsbs ac and qhs, ISS  Hypothyroidism Cont Levothyroxine  Hypertension/ hyperlipidemia, CAD s/p DES to LAD 2019 Cont aspirin 23m poq day Cont Lipitor 876LYpo qhs  Diastolic CHF Cont Lasix 465KPpo qam, 29mpo qpm  H/o Gout Cont Allopurinol 30085mo qday  Chronic back pain (follows with [facility] [place]) Cont muscle relaxer prn Cont percocet Cont oxycodone  Anxiety Cont Xanax  H/o Migraines Cont Topiramate 25m79m qhs  Copd Cont o2 Cont Anoro 1puff qday Cont Albuterol HFA    DVT Prophylaxis-   Lovenox - SCDs   AM Labs Ordered, also  please review Full Orders  Family Communication: Admission, patients condition and plan of care including tests being ordered have been discussed with the patient who indicate understanding and agree with the plan and Code Status.  Code Status:  DNR per patient  Admission status: Observation: Based on patients clinical presentation and evaluation of above clinical data, I have made determination that patient meets observation criteria at this time.   Time spent in minutes : 70 minutes   JameJani Combs on 03/31/2019 at 9:20 PM

## 2019-03-31 NOTE — Progress Notes (Signed)
Pharmacy Antibiotic Note  Stephanie Combs is a 68 y.o. female admitted on 03/31/2019 with hyperglycemia.  Pharmacy has been consulted for vancomycin and cefepime dosing for pneumonia.  Plan: Cefepime 2g IV q12h Vancomycin 2g IV x 1, then 1500mg  IV q48h for estimated AUC 494 (goal 400-550) Follow up renal function & cultures   Height: 5' 6.93" (170 cm) Weight: 282 lb 3 oz (128 kg) IBW/kg (Calculated) : 61.44  Temp (24hrs), Avg:97.8 F (36.6 C), Min:97.8 F (36.6 C), Max:97.8 F (36.6 C)  Recent Labs  Lab 03/31/19 1607 03/31/19 1608 03/31/19 1750  WBC  --  7.2  --   CREATININE  --  2.22*  --   LATICACIDVEN 2.2*  --  2.6*    Estimated Creatinine Clearance: 33.2 mL/min (A) (by C-G formula based on SCr of 2.22 mg/dL (H)).    Allergies  Allergen Reactions  . Dilaudid [Hydromorphone Hcl] Itching  . Midodrine Hcl Swelling    After one dose had anaphylactic reaction, had to call EMS.  Marland Kitchen Actifed Cold-Allergy [Chlorpheniramine-Phenylephrine]     "I was sick and red and it didn't agree with me at all"  . Doxycycline Nausea And Vomiting  . Other Cough    Pt states she is allergic to ragweed and that she starts coughing and sneezing like crazy  . Penicillins Hives and Itching    Tolerates Rocephin Has patient had a PCN reaction causing immediate rash, facial/tongue/throat swelling, SOB or lightheadedness with hypotension: Yes Has patient had a PCN reaction causing severe rash involving mucus membranes or skin necrosis: No Has patient had a PCN reaction that required hospitalization Yes Has patient had a PCN reaction occurring within the last 10 years: No If all of the above answers are "NO", then may proceed with Cephalosporin use.   . Reglan [Metoclopramide] Itching  . Valium [Diazepam] Itching  . Vistaril [Hydroxyzine Hcl] Itching    Antimicrobials this admission:  11/19 Vanc >> 11/19 Cefepime >>  Dose adjustments this admission:   Microbiology results:  11/19  BCx:  Thank you for allowing pharmacy to be a part of this patient's care.  Peggyann Juba, PharmD, BCPS 03/31/2019 7:44 PM

## 2019-03-31 NOTE — Progress Notes (Signed)
Pharmacy Antibiotic Note  Stephanie Combs is a 68 y.o. female admitted on 03/31/2019 with hyperglycemia.  Pharmacy has been consulted for Levaquin dosing for community-acquired    pneumonia.  Plan: Start Levaquin 750mg  IV q48h DC cefepime and vancomycin  Follow up renal function & cultures   Height: 5' 6.93" (170 cm) Weight: 282 lb 3 oz (128 kg) IBW/kg (Calculated) : 61.44  Temp (24hrs), Avg:97.8 F (36.6 C), Min:97.8 F (36.6 C), Max:97.8 F (36.6 C)  Recent Labs  Lab 03/31/19 1607 03/31/19 1608 03/31/19 1750  WBC  --  7.2  --   CREATININE  --  2.22*  --   LATICACIDVEN 2.2*  --  2.6*    Estimated Creatinine Clearance: 33.2 mL/min (A) (by C-G formula based on SCr of 2.22 mg/dL (H)).    Allergies  Allergen Reactions  . Dilaudid [Hydromorphone Hcl] Itching  . Midodrine Hcl Swelling    After one dose had anaphylactic reaction, had to call EMS.  Marland Kitchen Actifed Cold-Allergy [Chlorpheniramine-Phenylephrine]     "I was sick and red and it didn't agree with me at all"  . Doxycycline Nausea And Vomiting  . Other Cough    Pt states she is allergic to ragweed and that she starts coughing and sneezing like crazy  . Penicillins Hives and Itching    Tolerates Rocephin Has patient had a PCN reaction causing immediate rash, facial/tongue/throat swelling, SOB or lightheadedness with hypotension: Yes Has patient had a PCN reaction causing severe rash involving mucus membranes or skin necrosis: No Has patient had a PCN reaction that required hospitalization Yes Has patient had a PCN reaction occurring within the last 10 years: No If all of the above answers are "NO", then may proceed with Cephalosporin use.   . Reglan [Metoclopramide] Itching  . Valium [Diazepam] Itching  . Vistaril [Hydroxyzine Hcl] Itching    Antimicrobials this admission:  11/19 vancomycin >>11/19 11/19 cefepime >>11/19  Dose adjustments this admission:  [place]  Microbiology results:  11/19 [facility] x2:  pending 11/19 SARS CoV-2:  Thank you for allowing pharmacy to be a part of this patient's care.  Despina Pole, Pharm. D. Clinical Pharmacist 03/31/2019 10:21 PM

## 2019-03-31 NOTE — ED Triage Notes (Signed)
Patient presents to the Ed via [facility] per family call out due to altered mental status.  CBG 540 per EMS.  Patient is alert to self only in triage.

## 2019-03-31 NOTE — ED Notes (Signed)
Provider notified. Pt unable to produce urine sample at this time.

## 2019-03-31 NOTE — ED Notes (Signed)
Date and time results received: 03/31/19 1745 (use smartphrase ".now" to insert current time)  Test: lactic acid Critical Value:2.2  Name of Provider Notified: cook, md  Orders Received? Or Actions Taken?:

## 2019-04-01 ENCOUNTER — Inpatient Hospital Stay: Payer: Medicare Other

## 2019-04-01 ENCOUNTER — Observation Stay (HOSPITAL_COMMUNITY): Payer: Medicare Other

## 2019-04-01 ENCOUNTER — Inpatient Hospital Stay: Payer: Medicare Other | Admitting: Hematology

## 2019-04-01 ENCOUNTER — Encounter (HOSPITAL_COMMUNITY): Payer: Self-pay

## 2019-04-01 ENCOUNTER — Telehealth: Payer: Self-pay | Admitting: Hematology

## 2019-04-01 DIAGNOSIS — N179 Acute kidney failure, unspecified: Secondary | ICD-10-CM

## 2019-04-01 DIAGNOSIS — R4020 Unspecified coma: Secondary | ICD-10-CM | POA: Diagnosis not present

## 2019-04-01 DIAGNOSIS — R4182 Altered mental status, unspecified: Secondary | ICD-10-CM | POA: Diagnosis not present

## 2019-04-01 DIAGNOSIS — C7931 Secondary malignant neoplasm of brain: Secondary | ICD-10-CM | POA: Diagnosis not present

## 2019-04-01 LAB — CBC
HCT: 34.1 % — ABNORMAL LOW (ref 36.0–46.0)
Hemoglobin: 10.4 g/dL — ABNORMAL LOW (ref 12.0–15.0)
MCH: 31 pg (ref 26.0–34.0)
MCHC: 30.5 g/dL (ref 30.0–36.0)
MCV: 101.8 fL — ABNORMAL HIGH (ref 80.0–100.0)
Platelets: 223 10*3/uL (ref 150–400)
RBC: 3.35 MIL/uL — ABNORMAL LOW (ref 3.87–5.11)
RDW: 14.3 % (ref 11.5–15.5)
WBC: 5.6 10*3/uL (ref 4.0–10.5)
nRBC: 0 % (ref 0.0–0.2)

## 2019-04-01 LAB — COMPREHENSIVE METABOLIC PANEL
ALT: 10 U/L (ref 0–44)
AST: 10 U/L — ABNORMAL LOW (ref 15–41)
Albumin: 3.5 g/dL (ref 3.5–5.0)
Alkaline Phosphatase: 91 U/L (ref 38–126)
Anion gap: 11 (ref 5–15)
BUN: 31 mg/dL — ABNORMAL HIGH (ref 8–23)
CO2: 24 mmol/L (ref 22–32)
Calcium: 8.8 mg/dL — ABNORMAL LOW (ref 8.9–10.3)
Chloride: 104 mmol/L (ref 98–111)
Creatinine, Ser: 1.9 mg/dL — ABNORMAL HIGH (ref 0.44–1.00)
GFR calc Af Amer: 31 mL/min — ABNORMAL LOW (ref >60)
GFR calc non Af Amer: 26 mL/min — ABNORMAL LOW (ref >60)
Glucose, Bld: 100 mg/dL — ABNORMAL HIGH (ref 70–99)
Potassium: 3.9 mmol/L (ref 3.5–5.1)
Sodium: 139 mmol/L (ref 135–145)
Total Bilirubin: 0.7 mg/dL (ref 0.3–1.2)
Total Protein: 6.7 g/dL (ref 6.5–8.1)

## 2019-04-01 LAB — URINALYSIS, ROUTINE W REFLEX MICROSCOPIC
Bilirubin Urine: NEGATIVE
Glucose, UA: 50 mg/dL — AB
Ketones, ur: 5 mg/dL — AB
Leukocytes,Ua: NEGATIVE
Nitrite: NEGATIVE
Protein, ur: 100 mg/dL — AB
Specific Gravity, Urine: 1.02 (ref 1.005–1.030)
pH: 5 (ref 5.0–8.0)

## 2019-04-01 LAB — CBG MONITORING, ED
Glucose-Capillary: 104 mg/dL — ABNORMAL HIGH (ref 70–99)
Glucose-Capillary: 126 mg/dL — ABNORMAL HIGH (ref 70–99)
Glucose-Capillary: 215 mg/dL — ABNORMAL HIGH (ref 70–99)

## 2019-04-01 LAB — RAPID URINE DRUG SCREEN, HOSP PERFORMED
Amphetamines: NOT DETECTED
Barbiturates: NOT DETECTED
Benzodiazepines: POSITIVE — AB
Cocaine: NOT DETECTED
Opiates: POSITIVE — AB
Tetrahydrocannabinol: NOT DETECTED

## 2019-04-01 LAB — SARS CORONAVIRUS 2 (TAT 6-24 HRS): SARS Coronavirus 2: NEGATIVE

## 2019-04-01 LAB — SEDIMENTATION RATE: Sed Rate: 40 mm/hr — ABNORMAL HIGH (ref 0–22)

## 2019-04-01 LAB — AMMONIA: Ammonia: 20 umol/L (ref 9–35)

## 2019-04-01 LAB — TSH: TSH: 1.812 u[IU]/mL (ref 0.350–4.500)

## 2019-04-01 LAB — VITAMIN B12: Vitamin B-12: 509 pg/mL (ref 180–914)

## 2019-04-01 LAB — HIV ANTIBODY (ROUTINE TESTING W REFLEX): HIV Screen 4th Generation wRfx: NONREACTIVE

## 2019-04-01 LAB — GLUCOSE, CAPILLARY: Glucose-Capillary: 218 mg/dL — ABNORMAL HIGH (ref 70–99)

## 2019-04-01 LAB — PROCALCITONIN: Procalcitonin: 0.31 ng/mL

## 2019-04-01 MED ORDER — HYDROCORTISONE (PERIANAL) 2.5 % EX CREA
1.0000 "application " | TOPICAL_CREAM | Freq: Four times a day (QID) | CUTANEOUS | Status: DC | PRN
  Filled 2019-04-01: qty 28.35

## 2019-04-01 MED ORDER — ALPRAZOLAM 1 MG PO TABS
1.0000 mg | ORAL_TABLET | Freq: Three times a day (TID) | ORAL | Status: DC | PRN
  Administered 2019-04-01 – 2019-04-02 (×2): 1 mg via ORAL
  Filled 2019-04-01 (×2): qty 1

## 2019-04-01 MED ORDER — ATORVASTATIN CALCIUM 40 MG PO TABS
80.0000 mg | ORAL_TABLET | Freq: Every day | ORAL | Status: DC
  Administered 2019-04-01 – 2019-04-03 (×3): 80 mg via ORAL
  Filled 2019-04-01 (×3): qty 2

## 2019-04-01 MED ORDER — SILVER SULFADIAZINE 1 % EX CREA
1.0000 "application " | TOPICAL_CREAM | Freq: Two times a day (BID) | CUTANEOUS | Status: DC
  Filled 2019-04-01: qty 85

## 2019-04-01 MED ORDER — INSULIN DETEMIR 100 UNIT/ML ~~LOC~~ SOLN
40.0000 [IU] | Freq: Every day | SUBCUTANEOUS | Status: DC
  Administered 2019-04-01 – 2019-04-02 (×2): 40 [IU] via SUBCUTANEOUS
  Filled 2019-04-01 (×3): qty 0.4
  Filled 2019-04-01: qty 1

## 2019-04-01 MED ORDER — FUROSEMIDE 20 MG PO TABS
20.0000 mg | ORAL_TABLET | Freq: Every evening | ORAL | Status: DC
  Administered 2019-04-02: 20 mg via ORAL
  Filled 2019-04-01: qty 1

## 2019-04-01 MED ORDER — SENNA 8.6 MG PO TABS
2.0000 | ORAL_TABLET | Freq: Two times a day (BID) | ORAL | Status: DC
  Administered 2019-04-02 – 2019-04-03 (×4): 17.2 mg via ORAL
  Filled 2019-04-01 (×4): qty 2

## 2019-04-01 MED ORDER — OXYCODONE-ACETAMINOPHEN 10-325 MG PO TABS: 1.0000 | ORAL_TABLET | Freq: Four times a day (QID) | ORAL | Status: DC | PRN

## 2019-04-01 MED ORDER — UMECLIDINIUM-VILANTEROL 62.5-25 MCG/INH IN AEPB
1.0000 | INHALATION_SPRAY | Freq: Every day | RESPIRATORY_TRACT | Status: DC
  Filled 2019-04-01: qty 14

## 2019-04-01 MED ORDER — ASPIRIN EC 81 MG PO TBEC
81.0000 mg | DELAYED_RELEASE_TABLET | Freq: Every day | ORAL | Status: DC
  Administered 2019-04-02 – 2019-04-03 (×2): 81 mg via ORAL
  Filled 2019-04-01 (×2): qty 1

## 2019-04-01 MED ORDER — LEVOTHYROXINE SODIUM 100 MCG PO TABS
200.0000 ug | ORAL_TABLET | Freq: Every day | ORAL | Status: DC
  Administered 2019-04-02 – 2019-04-03 (×2): 200 ug via ORAL
  Filled 2019-04-01 (×3): qty 2

## 2019-04-01 MED ORDER — SUCRALFATE 1 G PO TABS
1.0000 g | ORAL_TABLET | Freq: Three times a day (TID) | ORAL | Status: DC
  Administered 2019-04-01 – 2019-04-03 (×8): 1 g via ORAL
  Filled 2019-04-01 (×8): qty 1

## 2019-04-01 MED ORDER — ONDANSETRON HCL 4 MG/2ML IJ SOLN
4.0000 mg | Freq: Four times a day (QID) | INTRAMUSCULAR | Status: DC | PRN
  Administered 2019-04-01: 4 mg via INTRAVENOUS
  Filled 2019-04-01 (×2): qty 2

## 2019-04-01 MED ORDER — ALLOPURINOL 300 MG PO TABS
300.0000 mg | ORAL_TABLET | Freq: Every day | ORAL | Status: DC
  Administered 2019-04-02 – 2019-04-03 (×2): 300 mg via ORAL
  Filled 2019-04-01 (×2): qty 1

## 2019-04-01 MED ORDER — FUROSEMIDE 40 MG PO TABS
40.0000 mg | ORAL_TABLET | Freq: Every day | ORAL | Status: DC
  Administered 2019-04-02: 40 mg via ORAL
  Filled 2019-04-01: qty 1

## 2019-04-01 MED ORDER — NYSTATIN 100000 UNIT/GM EX POWD
Freq: Two times a day (BID) | CUTANEOUS | Status: DC
  Administered 2019-04-01 – 2019-04-03 (×4): via TOPICAL
  Filled 2019-04-01: qty 15

## 2019-04-01 MED ORDER — TOPIRAMATE 25 MG PO TABS
75.0000 mg | ORAL_TABLET | Freq: Every day | ORAL | Status: DC
  Administered 2019-04-01 – 2019-04-02 (×2): 75 mg via ORAL
  Filled 2019-04-01 (×2): qty 3

## 2019-04-01 MED ORDER — GABAPENTIN 300 MG PO CAPS
600.0000 mg | ORAL_CAPSULE | Freq: Three times a day (TID) | ORAL | Status: DC
  Administered 2019-04-01 – 2019-04-03 (×6): 600 mg via ORAL
  Filled 2019-04-01 (×6): qty 2

## 2019-04-01 MED ORDER — PROCHLORPERAZINE MALEATE 5 MG PO TABS: 10.0000 mg | ORAL_TABLET | Freq: Four times a day (QID) | ORAL | Status: DC | PRN

## 2019-04-01 MED ORDER — GERHARDT'S BUTT CREAM
1.0000 "application " | TOPICAL_CREAM | Freq: Four times a day (QID) | CUTANEOUS | Status: DC | PRN
  Administered 2019-04-02 (×2): 1 via TOPICAL
  Filled 2019-04-01: qty 1

## 2019-04-01 MED ORDER — ALBUTEROL SULFATE (2.5 MG/3ML) 0.083% IN NEBU: 3.0000 mL | INHALATION_SOLUTION | RESPIRATORY_TRACT | Status: DC | PRN

## 2019-04-01 MED ORDER — ALUM & MAG HYDROXIDE-SIMETH 200-200-20 MG/5ML PO SUSP
30.0000 mL | ORAL | Status: DC | PRN
  Administered 2019-04-01: 30 mL via ORAL
  Filled 2019-04-01: qty 30

## 2019-04-01 MED ORDER — OXYCODONE HCL 5 MG PO TABS: 5.0000 mg | ORAL_TABLET | Freq: Two times a day (BID) | ORAL | Status: DC | PRN

## 2019-04-01 MED ORDER — OXYCODONE-ACETAMINOPHEN 5-325 MG PO TABS
1.0000 | ORAL_TABLET | Freq: Four times a day (QID) | ORAL | Status: DC | PRN
  Administered 2019-04-01 – 2019-04-03 (×3): 1 via ORAL
  Filled 2019-04-01 (×3): qty 1

## 2019-04-01 MED ORDER — POTASSIUM CHLORIDE CRYS ER 20 MEQ PO TBCR
20.0000 meq | EXTENDED_RELEASE_TABLET | Freq: Two times a day (BID) | ORAL | Status: DC
  Administered 2019-04-01 – 2019-04-03 (×4): 20 meq via ORAL
  Filled 2019-04-01 (×4): qty 1

## 2019-04-01 MED ORDER — POLYETHYLENE GLYCOL 3350 17 G PO PACK
17.0000 g | PACK | Freq: Two times a day (BID) | ORAL | Status: DC
  Administered 2019-04-01 – 2019-04-03 (×3): 17 g via ORAL
  Filled 2019-04-01 (×4): qty 1

## 2019-04-01 MED ORDER — PANTOPRAZOLE SODIUM 40 MG PO TBEC
40.0000 mg | DELAYED_RELEASE_TABLET | Freq: Two times a day (BID) | ORAL | Status: DC
  Administered 2019-04-01 – 2019-04-03 (×4): 40 mg via ORAL
  Filled 2019-04-01 (×4): qty 1

## 2019-04-01 MED ORDER — TIZANIDINE HCL 4 MG PO TABS: 4.0000 mg | ORAL_TABLET | Freq: Three times a day (TID) | ORAL | Status: DC | PRN

## 2019-04-01 MED ORDER — OXYCODONE HCL 5 MG PO TABS: 5.0000 mg | ORAL_TABLET | Freq: Four times a day (QID) | ORAL | Status: DC | PRN

## 2019-04-01 NOTE — ED Notes (Signed)
Patient transported to MRI 

## 2019-04-01 NOTE — ED Notes (Signed)
Patient denies pain and is resting comfortably.  

## 2019-04-01 NOTE — ED Notes (Signed)
Lab in room for lab draw.

## 2019-04-01 NOTE — Telephone Encounter (Signed)
R/s appt per 11/20 sch message - unable to reach pt . Left message with new appt date and time

## 2019-04-01 NOTE — Progress Notes (Addendum)
PROGRESS NOTE    Stephanie Combs  KDX:[id number] DOB: 1950-09-01 DOA: 03/31/2019 PCP: [name], MD   Brief Narrative:  Per HPI: Stephanie Combs  is a 68 y.o. 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  Pt states slight dry cough.  Pt denies fever, chills, cp, palp, sob beyond her baseline, abd pain, diarrhea, brbpr, dysuria.  Pt is axoxo2 (person, place), pt knows her oncologist name and also that she doesn't want radiation. Pt perseverates on this.   11/20: Patient was admitted with what appears to be acute versus chronic encephalopathy.  She appears to live at home by herself and I cannot get in touch with her daughter, but did speak with her cousin who was not aware that she was hospitalized.  Assessment & Plan:   Active Problems:   DM type 2 (diabetes mellitus, type 2) (HCC)   HTN (hypertension), benign   ARF (acute renal failure) (HCC)   Hypothyroidism   Uncontrolled type 2 diabetes mellitus with stage 3 chronic kidney disease (HCC)   Altered mental status  Acute versus chronic encephalopathy possibly related to polypharmacy -Patient noted to have metastatic brain lesions on MRI which are stable compared to 1 month prior -B12 levels within normal limits -TSH within normal limits -Ammonia levels ordered and pending  AKI -Resolving with use of IV fluid -Continue repeat BMP monitoring and maintain on IV fluid  Questionable community-acquired pneumonia -Follow blood cultures -COVID-19 testing pending -Continue Levaquin -Procalcitonin ordered and pending  Small cell lung carcinoma/colon cancer -Follow-up with Dr. Burr Medico as outpatient  Type 2 diabetes with neuropathy -Continue gabapentin -Continue Levemir 40 units subcu  nightly -Sliding scale insulin  Hypothyroidism -Levothyroxine  Hypertension/dyslipidemia/CAD -Continue aspirin and Lipitor  Diastolic CHF -Continue Lasix  History of gout -Continue allopurinol 300 mg daily  Chronic low back pain -Continue muscle relaxer, Percocet, and oxycodone  Anxiety -Continue Xanax  History of migraines -Continue topiramate  COPD -Continue oxygen supplementation as well as Anoro and albuterol   DVT prophylaxis: Lovenox Code Status: DNR Family Communication: Discussed with cousin Disposition Plan: Continue monitoring and evaluation for encephalopathic state.  PT evaluation and possible need for placement.   Consultants:   None  Procedures:   None  Antimicrobials:  Anti-infectives (From admission, onward)   Start     Dose/Rate Route Frequency Ordered Stop   04/02/19 2200  vancomycin (VANCOCIN) 1,500 mg in sodium chloride 0.9 % 500 mL IVPB  Status:  Discontinued     1,500 mg 250 mL/hr over 120 Minutes Intravenous Every 48 hours 03/31/19 1952 03/31/19 2152   04/01/19 0800  ceFEPIme (MAXIPIME) 2 g in sodium chloride 0.9 % 100 mL IVPB  Status:  Discontinued     2 g 200 mL/hr over 30 Minutes Intravenous Every 12 hours 03/31/19 1952 03/31/19 2152   04/01/19 0000  levofloxacin (LEVAQUIN) IVPB 750 mg     750 mg 100 mL/hr over 90 Minutes Intravenous Every 48 hours 03/31/19 2212     03/31/19 1930  ceFEPIme (MAXIPIME) 2 g in sodium chloride 0.9 % 100 mL IVPB     2 g 200 mL/hr over 30 Minutes Intravenous  Once 03/31/19 1928 03/31/19 2101   03/31/19 1930  vancomycin (VANCOCIN) IVPB 1000 mg/200 mL premix  2,000 mg 400 mL/hr over 60 Minutes Intravenous  Once 03/31/19 1929 03/31/19 2159       Subjective: Patient seen and evaluated today with ongoing confusion noted.  She cannot give any appropriate history.  Objective: Vitals:   04/01/19 0600 04/01/19 0630 04/01/19 1207 04/01/19 1230  BP: (!) 143/65 (!) 134/46 (!) 162/79 (!) 142/49  Pulse:  88 90 95 94  Resp: 19 18 14 20   Temp:      TempSrc:      SpO2: 99% 100% 97% 99%  Weight:      Height:        Intake/Output Summary (Last 24 hours) at 04/01/2019 1346 Last data filed at 04/01/2019 0139 Gross per 24 hour  Intake 1950 ml  Output --  Net 1950 ml   Filed Weights   03/31/19 1539  Weight: 128 kg    Examination:  General exam: Appears calm and comfortable, obese Respiratory system: Clear to auscultation. Respiratory effort normal.  Currently on nasal cannula oxygen. Cardiovascular system: S1 & S2 heard, RRR. No JVD, murmurs, rubs, gallops or clicks. No pedal edema. Gastrointestinal system: Abdomen is nondistended, soft and nontender. No organomegaly or masses felt. Normal bowel sounds heard. Central nervous system: Alert and awake, but does not appear oriented Extremities: Symmetric 5 x 5 power. Skin: No rashes, lesions or ulcers Psychiatry: Difficult to assess.    Data Reviewed: I have personally reviewed following labs and imaging studies  CBC: Recent Labs  Lab 03/31/19 1608 04/01/19 0402  WBC 7.2 5.6  NEUTROABS 6.3  --   HGB 10.8* 10.4*  HCT 35.5* 34.1*  MCV 102.3* 101.8*  PLT 244 967   Basic Metabolic Panel: Recent Labs  Lab 03/31/19 1608 04/01/19 0402  NA 137 139  K 3.8 3.9  CL 101 104  CO2 28 24  GLUCOSE 270* 100*  BUN 31* 31*  CREATININE 2.22* 1.90*  CALCIUM 9.3 8.8*   GFR: Estimated Creatinine Clearance: 38.8 mL/min (A) (by C-G formula based on SCr of 1.9 mg/dL (H)). Liver Function Tests: Recent Labs  Lab 03/31/19 1608 04/01/19 0402  AST 12* 10*  ALT 10 10  ALKPHOS 98 91  BILITOT 0.9 0.7  PROT 6.8 6.7  ALBUMIN 3.6 3.5   No results for input(s): LIPASE, AMYLASE in the last 168 hours. Recent Labs  Lab 04/01/19 1239  AMMONIA 20   Coagulation Profile: No results for input(s): INR, PROTIME in the last 168 hours. Cardiac Enzymes: No results for input(s): CKTOTAL, CKMB, CKMBINDEX, TROPONINI in the last 168 hours. BNP  (last 3 results) No results for input(s): PROBNP in the last 8760 hours. HbA1C: No results for input(s): HGBA1C in the last 72 hours. CBG: Recent Labs  Lab 03/31/19 1534 03/31/19 2201 04/01/19 0137 04/01/19 0847 04/01/19 1231  GLUCAP 389* 97 104* 126* 215*   Lipid Profile: No results for input(s): CHOL, HDL, LDLCALC, TRIG, CHOLHDL, LDLDIRECT in the last 72 hours. Thyroid Function Tests: Recent Labs    04/01/19 0402  TSH 1.812   Anemia Panel: Recent Labs    04/01/19 0402  VITAMINB12 509   Sepsis Labs: Recent Labs  Lab 03/31/19 1607 03/31/19 1750  LATICACIDVEN 2.2* 2.6*    Recent Results (from the past 240 hour(s))  Blood culture (routine x 2)     Status: None (Preliminary result)   Collection Time: 03/31/19  4:07 PM   Specimen: Right Antecubital; Blood  Result Value Ref Range Status   Specimen Description RIGHT ANTECUBITAL  Final   Special  Requests   Final    BOTTLES DRAWN AEROBIC AND ANAEROBIC Blood Culture adequate volume   Culture   Final    NO GROWTH < 24 HOURS Performed at [facility], [address] [place], [place] [address]    Report Status PENDING  Incomplete  Blood culture (routine x 2)     Status: None (Preliminary result)   Collection Time: 03/31/19  4:25 PM   Specimen: BLOOD LEFT HAND  Result Value Ref Range Status   Specimen Description BLOOD LEFT HAND  Final   Special Requests   Final    BOTTLES DRAWN AEROBIC AND ANAEROBIC Blood Culture adequate volume   Culture   Final    NO GROWTH < 24 HOURS Performed at [facility], [address] [place], [place] [address]    Report Status PENDING  Incomplete         Radiology Studies: Ct Head Wo Contrast  Result Date: 03/31/2019 CLINICAL DATA:  Patient with altered mental status.  Colon cancer. EXAM: CT HEAD WITHOUT CONTRAST TECHNIQUE: Contiguous axial images were obtained from the base of the skull through the vertex without intravenous contrast. COMPARISON:  Brain CT 01/06/2018. FINDINGS:  Brain: Ventricles and sulci are prominent compatible with atrophy. Periventricular and subcortical white matter hypodensities compatible with chronic microvascular ischemic changes. No evidence for acute cortically based infarct, intracranial hemorrhage, mass lesion or mass-effect. Vascular: Unremarkable Skull: Intact. Sinuses/Orbits: Paranasal sinuses are well aerated. Mastoid air cells are unremarkable. Orbits are unremarkable. Other: None. IMPRESSION: No acute intracranial process. Atrophy and chronic microvascular ischemic changes. Electronically Signed   By: Lovey Newcomer M.D.   On: 03/31/2019 18:28   Mr Brain Wo Contrast  Result Date: 04/01/2019 CLINICAL DATA:  Altered level of consciousness (LOC), unexplained altered mental status. EXAM: MRI HEAD WITHOUT CONTRAST TECHNIQUE: Multiplanar, multiecho pulse sequences of the brain and surrounding structures were obtained without intravenous contrast. COMPARISON:  Head CT 03/31/2019, brain MRI 03/03/2019. FINDINGS: Brain: There is mild to moderate motion degradation of multiple sequences, somewhat limiting evaluation. There is no convincing evidence of acute infarct. No midline shift or extra-axial fluid collection. A previously demonstrated subcentimeter hemorrhagic metastasis within the left posterior cerebellum, and 2 additional subcentimeter metastatic lesions within the right temporal lobe, were better appreciated on prior contrast-enhanced MRI brain 03/03/2019. Persistent small amount of vasogenic edema surrounding the temporal lobe lesions. Stable generalized parenchymal atrophy and chronic small vessel ischemic disease. Vascular: Flow voids maintained within the proximal large arterial vessels. Skull and upper cervical spine: No focal marrow lesion Sinuses/Orbits: Visualized orbits demonstrate no acute abnormality. Right mastoid effusion. IMPRESSION: 1. Motion degraded examination. 2. No evidence of acute infarct. 3. Subcentimeter metastatic lesions  within the left cerebellum and right temporal lobe (3 total) were better appreciated on prior contrast-enhanced brain MRI 03/03/2019. 4. Stable generalized parenchymal atrophy and chronic small vessel ischemic disease. 5. Right mastoid effusion. Electronically Signed   By: Kellie Simmering DO   On: 04/01/2019 07:59   Dg Chest Port 1 View  Result Date: 03/31/2019 CLINICAL DATA:  Altered mental status EXAM: PORTABLE CHEST 1 VIEW COMPARISON:  September 2020 FINDINGS: Right lung is clear. Left hilar mass is not well evaluated. Patchy increased density in the left upper lung. No pleural effusion. No pneumothorax. Normal heart size for portable technique. IMPRESSION: Patchy density in the left upper lung that may reflect atelectasis/consolidation, potentially postobstructive given hilar mass on prior imaging. Electronically Signed   By: Macy Mis M.D.   On: 03/31/2019 17:04  Scheduled Meds:  enoxaparin (LOVENOX) injection  60 mg Subcutaneous Q24H   insulin aspart  0-5 Units Subcutaneous QHS   insulin aspart  0-6 Units Subcutaneous TID WC   Continuous Infusions:  levofloxacin (LEVAQUIN) IV Stopped (04/01/19 0139)     LOS: 0 days    Time spent: 30 minutes    Charika Mikelson Darleen Crocker, DO [facility] Pager [phone]  If 7PM-7AM, please contact night-coverage [email or web address] Password [email or web address] 04/01/2019, 1:46 PM

## 2019-04-01 NOTE — Care Management Obs Status (Signed)
[place] NOTIFICATION   Patient Details  Name: YAREXI PAWLICKI MRN: [id number] Date of Birth: 02-21-1951   Medicare Observation Status Notification Given:  Yes    Abuk Selleck, Chauncey [place], RN 04/01/2019, 2:50 PM

## 2019-04-01 NOTE — ED Notes (Signed)
Pt had pulled previous iv in right outer ac area out, cath intact, bandage applied, new iv started,

## 2019-04-02 DIAGNOSIS — R4182 Altered mental status, unspecified: Secondary | ICD-10-CM | POA: Diagnosis present

## 2019-04-02 DIAGNOSIS — G9341 Metabolic encephalopathy: Secondary | ICD-10-CM | POA: Diagnosis present

## 2019-04-02 DIAGNOSIS — I130 Hypertensive heart and chronic kidney disease with heart failure and stage 1 through stage 4 chronic kidney disease, or unspecified chronic kidney disease: Secondary | ICD-10-CM | POA: Diagnosis present

## 2019-04-02 DIAGNOSIS — M545 Low back pain: Secondary | ICD-10-CM | POA: Diagnosis present

## 2019-04-02 DIAGNOSIS — I2510 Atherosclerotic heart disease of native coronary artery without angina pectoris: Secondary | ICD-10-CM | POA: Diagnosis present

## 2019-04-02 DIAGNOSIS — N179 Acute kidney failure, unspecified: Secondary | ICD-10-CM | POA: Diagnosis not present

## 2019-04-02 DIAGNOSIS — M109 Gout, unspecified: Secondary | ICD-10-CM | POA: Diagnosis present

## 2019-04-02 DIAGNOSIS — G8929 Other chronic pain: Secondary | ICD-10-CM | POA: Diagnosis present

## 2019-04-02 DIAGNOSIS — Z20828 Contact with and (suspected) exposure to other viral communicable diseases: Secondary | ICD-10-CM | POA: Diagnosis present

## 2019-04-02 DIAGNOSIS — I5032 Chronic diastolic (congestive) heart failure: Secondary | ICD-10-CM | POA: Diagnosis present

## 2019-04-02 DIAGNOSIS — Z66 Do not resuscitate: Secondary | ICD-10-CM | POA: Diagnosis present

## 2019-04-02 DIAGNOSIS — N183 Chronic kidney disease, stage 3 unspecified: Secondary | ICD-10-CM | POA: Diagnosis present

## 2019-04-02 DIAGNOSIS — F419 Anxiety disorder, unspecified: Secondary | ICD-10-CM | POA: Diagnosis present

## 2019-04-02 DIAGNOSIS — G9340 Encephalopathy, unspecified: Secondary | ICD-10-CM | POA: Diagnosis not present

## 2019-04-02 DIAGNOSIS — J189 Pneumonia, unspecified organism: Secondary | ICD-10-CM | POA: Diagnosis present

## 2019-04-02 DIAGNOSIS — E785 Hyperlipidemia, unspecified: Secondary | ICD-10-CM | POA: Diagnosis present

## 2019-04-02 DIAGNOSIS — C189 Malignant neoplasm of colon, unspecified: Secondary | ICD-10-CM | POA: Diagnosis present

## 2019-04-02 DIAGNOSIS — G43909 Migraine, unspecified, not intractable, without status migrainosus: Secondary | ICD-10-CM | POA: Diagnosis present

## 2019-04-02 DIAGNOSIS — J440 Chronic obstructive pulmonary disease with acute lower respiratory infection: Secondary | ICD-10-CM | POA: Diagnosis present

## 2019-04-02 DIAGNOSIS — E039 Hypothyroidism, unspecified: Secondary | ICD-10-CM | POA: Diagnosis present

## 2019-04-02 DIAGNOSIS — Z9981 Dependence on supplemental oxygen: Secondary | ICD-10-CM | POA: Diagnosis not present

## 2019-04-02 DIAGNOSIS — Z86010 Personal history of colonic polyps: Secondary | ICD-10-CM | POA: Diagnosis not present

## 2019-04-02 DIAGNOSIS — C7931 Secondary malignant neoplasm of brain: Secondary | ICD-10-CM | POA: Diagnosis present

## 2019-04-02 DIAGNOSIS — E1122 Type 2 diabetes mellitus with diabetic chronic kidney disease: Secondary | ICD-10-CM | POA: Diagnosis present

## 2019-04-02 DIAGNOSIS — E1140 Type 2 diabetes mellitus with diabetic neuropathy, unspecified: Secondary | ICD-10-CM | POA: Diagnosis present

## 2019-04-02 DIAGNOSIS — C3490 Malignant neoplasm of unspecified part of unspecified bronchus or lung: Secondary | ICD-10-CM | POA: Diagnosis present

## 2019-04-02 LAB — GLUCOSE, CAPILLARY
Glucose-Capillary: 127 mg/dL — ABNORMAL HIGH (ref 70–99)
Glucose-Capillary: 149 mg/dL — ABNORMAL HIGH (ref 70–99)
Glucose-Capillary: 265 mg/dL — ABNORMAL HIGH (ref 70–99)
Glucose-Capillary: 228 mg/dL — ABNORMAL HIGH (ref 70–99)

## 2019-04-02 LAB — RPR: RPR Ser Ql: NONREACTIVE

## 2019-04-02 MED ORDER — LACTATED RINGERS IV SOLN
INTRAVENOUS | Status: DC
  Administered 2019-04-02 – 2019-04-03 (×2): via INTRAVENOUS

## 2019-04-02 MED ORDER — HEPARIN SODIUM (PORCINE) 5000 UNIT/ML IJ SOLN
5000.0000 [IU] | Freq: Three times a day (TID) | INTRAMUSCULAR | Status: DC
  Administered 2019-04-02 – 2019-04-03 (×3): 5000 [IU] via SUBCUTANEOUS
  Filled 2019-04-02 (×3): qty 1

## 2019-04-02 NOTE — Progress Notes (Signed)
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 Pt states slight dry cough. Pt denies fever, chills, cp, palp, sob beyond her baseline, abd pain, diarrhea, brbpr, dysuria. Pt is axoxo2 (person, place), pt knows her oncologist name and also that she doesn't want radiation. Pt perseverates on this.   11/20: Patient was admitted with what appears to be acute versus chronic encephalopathy.  She appears to live at home by herself and I cannot get in touch with her daughter, but did speak with her cousin who was not aware that she was hospitalized.  11/21: Patient continues to have ongoing encephalopathy with confusion.  Will order PT and social worker evaluation as she does not appear to be safe for discharge to home.  Cannot get in touch with daughter whom I have tried calling multiple times.  Continue on IV antibiotics for suspected pneumonia with procalcitonin noted to be elevated.  Assessment & Plan:   Active Problems:   DM type 2 (diabetes mellitus, type 2) (HCC)   HTN (hypertension), benign   ARF (acute renal failure) (HCC)   Hypothyroidism   Uncontrolled type 2 diabetes mellitus with stage 3 chronic kidney disease (HCC)   Altered mental status   Acute versus chronic encephalopathy possibly related to polypharmacy -Patient noted to have metastatic brain lesions on MRI which are stable compared to 1 month prior -B12 levels within normal limits -TSH within normal limits -Ammonia levels within normal limits  AKI -Repeat labs in a.m. -Continue repeat BMP monitoring and maintain on IV  fluid  Questionable community-acquired pneumonia -Follow blood cultures -COVID-19 testing pending -Continue Levaquin -Procalcitonin ordered and pending  Small cell lung carcinoma/colon cancer -Follow-up with Dr. Burr Medico as outpatient  Type 2 diabetes with neuropathy-stable blood glucose levels -Continue gabapentin -Continue Levemir 40 units subcu nightly -Sliding scale insulin  Hypothyroidism -Levothyroxine  Hypertension/dyslipidemia/CAD -Continue aspirin and Lipitor  Diastolic CHF -Continue Lasix  History of gout -Continue allopurinol 300 mg daily  Chronic low back pain -Continue muscle relaxer, Percocet, and oxycodone  Anxiety -Continue Xanax  History of migraines -Continue topiramate  COPD -Continue oxygen supplementation as well as Anoro and albuterol   DVT prophylaxis: Lovenox Code Status: DNR Family Communication:  Tried calling daughter with no answer Disposition Plan: Continue monitoring and evaluation for encephalopathic state.  PT evaluation and possible need for placement.  CSW involvement.  Continue IV fluid and IV antibiotics.   Consultants:   None  Procedures:   None  Antimicrobials:  Anti-infectives (From admission, onward)   Start     Dose/Rate Route Frequency Ordered Stop   04/02/19 2200  vancomycin (VANCOCIN) 1,500 mg in sodium chloride 0.9 % 500 mL IVPB  Status:  Discontinued     1,500 mg 250 mL/hr over 120 Minutes Intravenous Every 48 hours 03/31/19 1952 03/31/19 2152   04/01/19 0800  ceFEPIme (MAXIPIME) 2 g in sodium chloride 0.9 % 100 mL IVPB  Status:  Discontinued     2 g 200 mL/hr over 30 Minutes Intravenous Every 12 hours 03/31/19 1952 03/31/19 2152   04/01/19 0000  levofloxacin (LEVAQUIN) IVPB 750 mg     750 mg 100 mL/hr  over 90 Minutes Intravenous Every 48 hours 03/31/19 2212     03/31/19 1930  ceFEPIme (MAXIPIME) 2 g in sodium chloride 0.9 % 100 mL IVPB     2 g 200 mL/hr over 30 Minutes Intravenous  Once  03/31/19 1928 03/31/19 2101   03/31/19 1930  vancomycin (VANCOCIN) IVPB 1000 mg/200 mL premix     2,000 mg 400 mL/hr over 60 Minutes Intravenous  Once 03/31/19 1929 03/31/19 2159       Subjective: Patient seen and evaluated today with ongoing confusion noted.  She tends to repeat words that have just been said.  No acute overnight events noted.  Objective: Vitals:   04/01/19 2127 04/02/19 0538 04/02/19 0854 04/02/19 1200  BP: (!) 172/87 (!) 173/80  (!) 157/88  Pulse: 100 89  90  Resp: 18 17 18 16   Temp: 99.2 F (37.3 C) 98.7 F (37.1 C)    TempSrc: Oral Oral    SpO2: 95% 94% 94% 99%  Weight:  122.3 kg    Height:        Intake/Output Summary (Last 24 hours) at 04/02/2019 1245 Last data filed at 04/02/2019 0538 Gross per 24 hour  Intake -  Output 1700 ml  Net -1700 ml   Filed Weights   03/31/19 1539 04/02/19 0538  Weight: 128 kg 122.3 kg    Examination:  General exam: Appears calm and comfortable  Respiratory system: Clear to auscultation. Respiratory effort normal.  Currently on room air. Cardiovascular system: S1 & S2 heard, RRR. No JVD, murmurs, rubs, gallops or clicks. No pedal edema. Gastrointestinal system: Abdomen is nondistended, soft and nontender. No organomegaly or masses felt. Normal bowel sounds heard. Central nervous system: Alert and awake.  Quite confused. Extremities: Symmetric 5 x 5 power. Skin: No rashes, lesions or ulcers Psychiatry: Difficult to assess.    Data Reviewed: I have personally reviewed following labs and imaging studies  CBC: Recent Labs  Lab 03/31/19 1608 04/01/19 0402  WBC 7.2 5.6  NEUTROABS 6.3  --   HGB 10.8* 10.4*  HCT 35.5* 34.1*  MCV 102.3* 101.8*  PLT 244 625   Basic Metabolic Panel: Recent Labs  Lab 03/31/19 1608 04/01/19 0402  NA 137 139  K 3.8 3.9  CL 101 104  CO2 28 24  GLUCOSE 270* 100*  BUN 31* 31*  CREATININE 2.22* 1.90*  CALCIUM 9.3 8.8*   GFR: Estimated Creatinine Clearance: 37.9 mL/min (A)  (by C-G formula based on SCr of 1.9 mg/dL (H)). Liver Function Tests: Recent Labs  Lab 03/31/19 1608 04/01/19 0402  AST 12* 10*  ALT 10 10  ALKPHOS 98 91  BILITOT 0.9 0.7  PROT 6.8 6.7  ALBUMIN 3.6 3.5   No results for input(s): LIPASE, AMYLASE in the last 168 hours. Recent Labs  Lab 04/01/19 1239  AMMONIA 20   Coagulation Profile: No results for input(s): INR, PROTIME in the last 168 hours. Cardiac Enzymes: No results for input(s): CKTOTAL, CKMB, CKMBINDEX, TROPONINI in the last 168 hours. BNP (last 3 results) No results for input(s): PROBNP in the last 8760 hours. HbA1C: No results for input(s): HGBA1C in the last 72 hours. CBG: Recent Labs  Lab 04/01/19 0847 04/01/19 1231 04/01/19 2129 04/02/19 0849 04/02/19 1142  GLUCAP 126* 215* 218* 127* 149*   Lipid Profile: No results for input(s): CHOL, HDL, LDLCALC, TRIG, CHOLHDL, LDLDIRECT in the last 72 hours. Thyroid Function Tests: Recent Labs    04/01/19 0402  TSH 1.812   Anemia Panel: Recent Labs  04/01/19 0402  VITAMINB12 509   Sepsis Labs: Recent Labs  Lab 03/31/19 1607 03/31/19 1750 04/01/19 1239  PROCALCITON  --   --  0.31  LATICACIDVEN 2.2* 2.6*  --     Recent Results (from the past 240 hour(s))  Blood culture (routine x 2)     Status: None (Preliminary result)   Collection Time: 03/31/19  4:07 PM   Specimen: Right Antecubital; Blood  Result Value Ref Range Status   Specimen Description RIGHT ANTECUBITAL  Final   Special Requests   Final    BOTTLES DRAWN AEROBIC AND ANAEROBIC Blood Culture adequate volume   Culture   Final    NO GROWTH 2 DAYS Performed at [facility], [address], [place], [place] [address]    Report Status PENDING  Incomplete  Blood culture (routine x 2)     Status: None (Preliminary result)   Collection Time: 03/31/19  4:25 PM   Specimen: BLOOD LEFT HAND  Result Value Ref Range Status   Specimen Description BLOOD LEFT HAND  Final   Special Requests   Final     BOTTLES DRAWN AEROBIC AND ANAEROBIC Blood Culture adequate volume   Culture   Final    NO GROWTH 2 DAYS Performed at [facility], [address], [place], [place] [address]    Report Status PENDING  Incomplete  SARS CORONAVIRUS 2 (TAT 6-24 HRS) Nasopharyngeal Nasopharyngeal Swab     Status: None   Collection Time: 03/31/19  9:08 PM   Specimen: Nasopharyngeal Swab  Result Value Ref Range Status   SARS Coronavirus 2 NEGATIVE NEGATIVE Final    Comment: (NOTE) SARS-CoV-2 target nucleic acids are NOT DETECTED. The SARS-CoV-2 RNA is generally detectable in upper and lower respiratory specimens during the acute phase of infection. Negative results do not preclude SARS-CoV-2 infection, do not rule out co-infections with other pathogens, and should not be used as the sole basis for treatment or other patient management decisions. Negative results must be combined with clinical observations, patient history, and epidemiological information. The expected result is Negative. Fact Sheet for Patients: SugarRoll.be Fact Sheet for Healthcare Providers: https://www.woods-mathews.com/ This test is not yet approved or cleared by the [place] FDA and  has been authorized for detection and/or diagnosis of SARS-CoV-2 by FDA under an Emergency Use Authorization (EUA). This EUA will remain  in effect (meaning this test can be used) for the duration of the COVID-19 declaration under Section 56 4(b)(1) of the Act, 21 U.S.C. section 360bbb-3(b)(1), unless the authorization is terminated or revoked sooner. Performed at [facility], [place] [address], [place],  [address]          Radiology Studies: Ct Head Wo Contrast  Result Date: 03/31/2019 CLINICAL DATA:  Patient with altered mental status.  Colon cancer. EXAM: CT HEAD WITHOUT CONTRAST TECHNIQUE: Contiguous axial images were obtained from the base of the skull through the vertex without  intravenous contrast. COMPARISON:  Brain CT 01/06/2018. FINDINGS: Brain: Ventricles and sulci are prominent compatible with atrophy. Periventricular and subcortical white matter hypodensities compatible with chronic microvascular ischemic changes. No evidence for acute cortically based infarct, intracranial hemorrhage, mass lesion or mass-effect. Vascular: Unremarkable Skull: Intact. Sinuses/Orbits: Paranasal sinuses are well aerated. Mastoid air cells are unremarkable. Orbits are unremarkable. Other: None. IMPRESSION: No acute intracranial process. Atrophy and chronic microvascular ischemic changes. Electronically Signed   By: Lovey Newcomer M.D.   On: 03/31/2019 18:28   Mr Brain Wo Contrast  Result Date: 04/01/2019 CLINICAL  DATA:  Altered level of consciousness (LOC), unexplained altered mental status. EXAM: MRI HEAD WITHOUT CONTRAST TECHNIQUE: Multiplanar, multiecho pulse sequences of the brain and surrounding structures were obtained without intravenous contrast. COMPARISON:  Head CT 03/31/2019, brain MRI 03/03/2019. FINDINGS: Brain: There is mild to moderate motion degradation of multiple sequences, somewhat limiting evaluation. There is no convincing evidence of acute infarct. No midline shift or extra-axial fluid collection. A previously demonstrated subcentimeter hemorrhagic metastasis within the left posterior cerebellum, and 2 additional subcentimeter metastatic lesions within the right temporal lobe, were better appreciated on prior contrast-enhanced MRI brain 03/03/2019. Persistent small amount of vasogenic edema surrounding the temporal lobe lesions. Stable generalized parenchymal atrophy and chronic small vessel ischemic disease. Vascular: Flow voids maintained within the proximal large arterial vessels. Skull and upper cervical spine: No focal marrow lesion Sinuses/Orbits: Visualized orbits demonstrate no acute abnormality. Right mastoid effusion. IMPRESSION: 1. Motion degraded examination. 2. No  evidence of acute infarct. 3. Subcentimeter metastatic lesions within the left cerebellum and right temporal lobe (3 total) were better appreciated on prior contrast-enhanced brain MRI 03/03/2019. 4. Stable generalized parenchymal atrophy and chronic small vessel ischemic disease. 5. Right mastoid effusion. Electronically Signed   By: Kellie Simmering DO   On: 04/01/2019 07:59   Dg Chest Port 1 View  Result Date: 03/31/2019 CLINICAL DATA:  Altered mental status EXAM: PORTABLE CHEST 1 VIEW COMPARISON:  September 2020 FINDINGS: Right lung is clear. Left hilar mass is not well evaluated. Patchy increased density in the left upper lung. No pleural effusion. No pneumothorax. Normal heart size for portable technique. IMPRESSION: Patchy density in the left upper lung that may reflect atelectasis/consolidation, potentially postobstructive given hilar mass on prior imaging. Electronically Signed   By: Macy Mis M.D.   On: 03/31/2019 17:04        Scheduled Meds: . allopurinol  300 mg Oral Daily  . aspirin EC  81 mg Oral Daily  . atorvastatin  80 mg Oral q1800  . enoxaparin (LOVENOX) injection  60 mg Subcutaneous Q24H  . furosemide  20 mg Oral QPM  . furosemide  40 mg Oral Daily  . gabapentin  600 mg Oral TID  . insulin aspart  0-5 Units Subcutaneous QHS  . insulin aspart  0-6 Units Subcutaneous TID WC  . insulin detemir  40 Units Subcutaneous QHS  . levothyroxine  200 mcg Oral QAC breakfast  . nystatin   Topical BID  . pantoprazole  40 mg Oral BID  . polyethylene glycol  17 g Oral BID  . potassium chloride SA  20 mEq Oral BID  . senna  2 tablet Oral BID WC  . silver sulfADIAZINE  1 application Topical BID  . sucralfate  1 g Oral TID WC & HS  . topiramate  75 mg Oral QHS  . umeclidinium-vilanterol  1 puff Inhalation Daily   Continuous Infusions: . levofloxacin (LEVAQUIN) IV Stopped (04/01/19 0139)     LOS: 0 days    Time spent: 30 minutes    Pratik Darleen Crocker, DO [facility]  Pager [phone]  If 7PM-7AM, please contact night-coverage [email or web address] Password [email or web address] 04/02/2019, 12:45 PM

## 2019-04-02 NOTE — Plan of Care (Signed)
PT remains confused to time and situation.  Problem: Education: Goal: Knowledge of General Education information will improve Description: Including pain rating scale, medication(s)/side effects and non-pharmacologic comfort measures Outcome: Progressing   Problem: Health Behavior/Discharge Planning: Goal: Ability to manage health-related needs will improve Outcome: Progressing   Problem: Clinical Measurements: Goal: Ability to maintain clinical measurements within normal limits will improve Outcome: Progressing Goal: Will remain free from infection Outcome: Progressing Goal: Diagnostic test results will improve Outcome: Progressing Goal: Respiratory complications will improve Outcome: Progressing Goal: Cardiovascular complication will be avoided Outcome: Progressing   Problem: Activity: Goal: Risk for activity intolerance will decrease Outcome: Progressing   Problem: Nutrition: Goal: Adequate nutrition will be maintained Outcome: Progressing   Problem: Coping: Goal: Level of anxiety will decrease Outcome: Progressing   Problem: Elimination: Goal: Will not experience complications related to bowel motility Outcome: Progressing Goal: Will not experience complications related to urinary retention Outcome: Progressing   Problem: Pain Managment: Goal: General experience of comfort will improve Outcome: Progressing   Problem: Safety: Goal: Ability to remain free from injury will improve Outcome: Progressing   Problem: Skin Integrity: Goal: Risk for impaired skin integrity will decrease Outcome: Progressing

## 2019-04-02 NOTE — Progress Notes (Signed)
Pt moved to room 314 via wheelchair.

## 2019-04-03 DIAGNOSIS — G9340 Encephalopathy, unspecified: Secondary | ICD-10-CM

## 2019-04-03 LAB — GLUCOSE, CAPILLARY
Glucose-Capillary: 199 mg/dL — ABNORMAL HIGH (ref 70–99)
Glucose-Capillary: 258 mg/dL — ABNORMAL HIGH (ref 70–99)
Glucose-Capillary: 226 mg/dL — ABNORMAL HIGH (ref 70–99)

## 2019-04-03 LAB — PROCALCITONIN: Procalcitonin: 0.14 ng/mL

## 2019-04-03 LAB — LACTIC ACID, PLASMA: Lactic Acid, Venous: 0.8 mmol/L (ref 0.5–1.9)

## 2019-04-03 LAB — CBC
HCT: 36.2 % (ref 36.0–46.0)
Hemoglobin: 11.2 g/dL — ABNORMAL LOW (ref 12.0–15.0)
MCH: 30.9 pg (ref 26.0–34.0)
MCHC: 30.9 g/dL (ref 30.0–36.0)
MCV: 100 fL (ref 80.0–100.0)
Platelets: 212 10*3/uL (ref 150–400)
RBC: 3.62 MIL/uL — ABNORMAL LOW (ref 3.87–5.11)
RDW: 13.8 % (ref 11.5–15.5)
WBC: 5.5 10*3/uL (ref 4.0–10.5)
nRBC: 0 % (ref 0.0–0.2)

## 2019-04-03 LAB — BASIC METABOLIC PANEL
Anion gap: 10 (ref 5–15)
BUN: 21 mg/dL (ref 8–23)
CO2: 26 mmol/L (ref 22–32)
Calcium: 8.9 mg/dL (ref 8.9–10.3)
Chloride: 100 mmol/L (ref 98–111)
Creatinine, Ser: 1.48 mg/dL — ABNORMAL HIGH (ref 0.44–1.00)
GFR calc Af Amer: 41 mL/min — ABNORMAL LOW (ref >60)
GFR calc non Af Amer: 36 mL/min — ABNORMAL LOW (ref >60)
Glucose, Bld: 222 mg/dL — ABNORMAL HIGH (ref 70–99)
Potassium: 4.1 mmol/L (ref 3.5–5.1)
Sodium: 136 mmol/L (ref 135–145)

## 2019-04-03 MED ORDER — LEVOFLOXACIN 750 MG PO TABS: 750.0000 mg | ORAL_TABLET | ORAL | 0 refills | Status: AC

## 2019-04-03 NOTE — Progress Notes (Signed)
Pt is in good spirits this morning. She ate breakfast and tolerated taking all of her medications. Will continue to monitor the pt today.

## 2019-04-03 NOTE — Discharge Summary (Addendum)
Physician Discharge Summary  DANICKA HOURIHAN ZDG:[id number] DOB: 01-23-1951 DOA: 03/31/2019  PCP: [name], MD  Admit date: 03/31/2019  Discharge date: 04/03/2019  Admitted From:Home  Disposition:  Home  Recommendations for Outpatient Follow-up:  1. Follow up with PCP in 1-2 weeks 2. Follow-up with Dr. Burr Medico of oncology as scheduled or in the next 2 weeks 3. Continue on home medications as prior 4. Continue on Levaquin as prescribed to complete course of treatment for community-acquired pneumonia.  Patient is a high risk for readmission as she does have polypharmacy and tends to have nausea and vomiting with poor dietary regulation in the setting of ongoing chemotherapy/radiation.  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 Pt states slight dry cough. Pt denies fever, chills, cp, palp, sob beyond her baseline, abd pain, diarrhea, brbpr, dysuria. Pt is axoxo2 (person, place), pt knows her oncologist name and also that she doesn't want radiation. Pt perseverates on this.   11/20:Patient was admitted with what appears to be acute versus chronic encephalopathy. She appears to live at home by herself and I cannot get in touch with her daughter, but did speak with her cousin who was not aware that she was hospitalized.  11/21: Patient continues to have ongoing encephalopathy with confusion.  Will order PT and social worker evaluation as she does not appear to be safe for discharge to home.  Cannot get in touch  with daughter whom I have tried calling multiple times.  Continue on IV antibiotics for suspected pneumonia with procalcitonin noted to be elevated.  11/22: Patient has no further confusion or encephalopathy and this was noted yesterday evening with her daughter at bedside who states that patient is back to baseline.  Her kidney injury has resolved and her creatinine is back to baseline of 1.48.  It appears that her AKI and combination of polypharmacy and poor glucose control may have resulted in her encephalopathic state.  Her blood glucose levels have now stabilized on her typical home regimen and she has been encouraged to follow a carb modified diet and to watch her carbohydrate intake.  Additionally, she is tolerating her usual home medications to include pain medications as well as antianxiety.  Her creatinine levels have now normalized after IV fluid administration and she appears stable for discharge with close follow-up plan to her PCP as well as her oncologist.  No other acute events noted during the course of this admission.  She will require 5 more days of treatment on her Levaquin and this has been scheduled for every other day.  Discharge Diagnoses:  Active Problems:   DM type 2 (diabetes mellitus, type 2) (HCC)   HTN (hypertension), benign   ARF (acute renal failure) (HCC)   Hypothyroidism   Uncontrolled type 2 diabetes mellitus with stage 3 chronic kidney disease (HCC)   Altered mental status   Acute encephalopathy  Principal discharge diagnosis: Acute metabolic encephalopathy secondary to AKI with polypharmacy as well as community-acquired pneumonia.  Discharge Instructions  Discharge Instructions    Diet - low sodium heart healthy   Complete by: As directed  Increase activity slowly   Complete by: As directed      Allergies as of 04/03/2019      Reactions   Dilaudid [hydromorphone Hcl] Itching   Midodrine Hcl Swelling   After one dose had anaphylactic reaction, had to  call EMS.   Actifed Cold-allergy [chlorpheniramine-phenylephrine]    "I was sick and red and it didn't agree with me at all"   Doxycycline Nausea And Vomiting   Other Cough   Pt states she is allergic to ragweed and that she starts coughing and sneezing like crazy   Penicillins Hives, Itching   Tolerates Rocephin Has patient had a PCN reaction causing immediate rash, facial/tongue/throat swelling, SOB or lightheadedness with hypotension: Yes Has patient had a PCN reaction causing severe rash involving mucus membranes or skin necrosis: No Has patient had a PCN reaction that required hospitalization Yes Has patient had a PCN reaction occurring within the last 10 years: No If all of the above answers are "NO", then may proceed with Cephalosporin use.   Reglan [metoclopramide] Itching   Valium [diazepam] Itching   Vistaril [hydroxyzine Hcl] Itching      Medication List    STOP taking these medications   potassium chloride 10 MEQ CR capsule Commonly known as: MICRO-K     TAKE these medications   acetaminophen-codeine 300-30 MG tablet Commonly known as: TYLENOL #3 Take 1 tablet by mouth every 6 (six) hours as needed for moderate pain.   albuterol (2.5 MG/3ML) 0.083% nebulizer solution Commonly known as: PROVENTIL Take 2.5 mg by nebulization 2 (two) times daily as needed for wheezing or shortness of breath.   albuterol 108 (90 Base) MCG/ACT inhaler Commonly known as: ProAir HFA Inhale 2 puffs into the lungs every 4 (four) hours as needed for wheezing or shortness of breath. For shortness of breath   allopurinol 300 MG tablet Commonly known as: ZYLOPRIM Take 300 mg by mouth daily.   ALPRAZolam 1 MG tablet Commonly known as: XANAX Take 1 mg by mouth 3 (three) times daily as needed for anxiety or sleep.   aspirin EC 81 MG tablet Take 81 mg by mouth daily.   atorvastatin 80 MG tablet Commonly known as: LIPITOR Take 1 tablet (80 mg total) by mouth daily at 6 PM.   busPIRone  7.5 MG tablet Commonly known as: BUSPAR Take 7.5 mg by mouth 2 (two) times daily.   cholecalciferol 25 MCG (1000 UT) tablet Commonly known as: VITAMIN D3 Take 5,000 Units by mouth daily.   diphenhydrAMINE-zinc acetate cream Commonly known as: BENADRYL Apply 1 application topically 3 (three) times daily as needed for itching.   diphenoxylate-atropine 2.5-0.025 MG tablet Commonly known as: LOMOTIL Take 1 tablet by mouth 4 (four) times daily as needed for diarrhea or loose stools.   FISH OIL PO Take 1 capsule by mouth daily.   furosemide 40 MG tablet Commonly known as: LASIX Take 60 mg (1 1/2 tabs) in the morning and 40 mg (1 tab) in the afternoon What changed:   how much to take  how to take this  when to take this  additional instructions  Another medication with the same name was removed. Continue taking this medication, and follow the directions you see here.   gabapentin 600 MG tablet Commonly known as: NEURONTIN Take 600 mg by mouth 3 (three) times daily.   Gerhardt's butt cream Crea Apply 1 application topically 4 (four) times daily as needed for irritation.   hydrocortisone 2.5 % rectal cream Commonly  known as: ANUSOL-HC Place 1 application rectally 4 (four) times daily as needed for hemorrhoids.   insulin aspart 100 UNIT/ML injection Commonly known as: novoLOG Inject 1-18 Units into the skin 3 (three) times daily with meals.   insulin detemir 100 UNIT/ML injection Commonly known as: Levemir Inject 0.4 mLs (40 Units total) into the skin at bedtime.   insulin lispro 100 UNIT/ML injection Commonly known as: HUMALOG Inject 0-20 Units into the skin 3 (three) times daily with meals.   levofloxacin 750 MG tablet Commonly known as: Levaquin Take 1 tablet (750 mg total) by mouth every other day for 5 days.   levothyroxine 200 MCG tablet Commonly known as: SYNTHROID Take 1 tablet by mouth daily before breakfast.   nitroGLYCERIN 0.4 MG SL tablet Commonly  known as: NITROSTAT Place 0.4 mg under the tongue every 5 (five) minutes as needed for chest pain.   nystatin powder Commonly known as: MYCOSTATIN/NYSTOP Apply topically 2 (two) times daily.   ondansetron 8 MG disintegrating tablet Commonly known as: ZOFRAN-ODT TAKE 1 TABLET BY MOUTH EVERY 8 HOURS AS NEEDED FOR NAUSEA OR VOMITING.   ondansetron 8 MG tablet Commonly known as: ZOFRAN Take 1 tablet (8 mg total) by mouth every 8 (eight) hours as needed for nausea or vomiting.   oxyCODONE 5 MG immediate release tablet Commonly known as: Oxy IR/ROXICODONE Take 1 tablet (5 mg total) by mouth every 12 (twelve) hours as needed for severe pain.   oxyCODONE-acetaminophen 10-325 MG tablet Commonly known as: PERCOCET Take 1 tablet by mouth every 6 (six) hours as needed for pain.   OXYGEN Inhale 2 L into the lungs continuous.   pantoprazole 40 MG tablet Commonly known as: PROTONIX Take 1 tablet (40 mg total) by mouth 2 (two) times daily.   potassium chloride SA 20 MEQ tablet Commonly known as: KLOR-CON Take 1 tablet (20 mEq total) by mouth 2 (two) times daily.   prochlorperazine 10 MG tablet Commonly known as: COMPAZINE Take 1 tablet (10 mg total) by mouth every 6 (six) hours as needed for nausea or vomiting.   promethazine 12.5 MG tablet Commonly known as: PHENERGAN Take 1 tablet (12.5 mg total) by mouth every 6 (six) hours as needed for nausea or vomiting.   silver sulfADIAZINE 1 % cream Commonly known as: SILVADENE Apply 1 application topically 2 (two) times daily.   sucralfate 1 g tablet Commonly known as: Carafate Take 1 tablet (1 g total) by mouth 4 (four) times daily -  with meals and at bedtime. 5 min before meals for radiation induced esophagitis   SURE COMFORT INS SYR 1CC/28G 28G X 1/2" 1 ML Misc Generic drug: INS SYRINGE/NEEDLE 1CC/28G USE TO INJECT INSULIN UP TO 4 TIMES DAILY.   tiZANidine 4 MG tablet Commonly known as: ZANAFLEX Take 1 tablet (4 mg total) by  mouth every 8 (eight) hours as needed for muscle spasms.   topiramate 25 MG tablet Commonly known as: TOPAMAX Take 75 mg by mouth at bedtime.   umeclidinium-vilanterol 62.5-25 MCG/INH Aepb Commonly known as: ANORO ELLIPTA Inhale 1 puff into the lungs daily.      Follow-up Information    [name], MD Follow up in 1 week(s).   Specialty: Internal Medicine Contact information: [address] [place] [address] [phone]        Truitt Merle, MD Follow up in 2 week(s).   Specialties: Hematology, Oncology Contact information: [address] [name] [address] [phone]  Allergies  Allergen Reactions  . Dilaudid [Hydromorphone Hcl] Itching  . Midodrine Hcl Swelling    After one dose had anaphylactic reaction, had to call EMS.  Marland Kitchen Actifed Cold-Allergy [Chlorpheniramine-Phenylephrine]     "I was sick and red and it didn't agree with me at all"  . Doxycycline Nausea And Vomiting  . Other Cough    Pt states she is allergic to ragweed and that she starts coughing and sneezing like crazy  . Penicillins Hives and Itching    Tolerates Rocephin Has patient had a PCN reaction causing immediate rash, facial/tongue/throat swelling, SOB or lightheadedness with hypotension: Yes Has patient had a PCN reaction causing severe rash involving mucus membranes or skin necrosis: No Has patient had a PCN reaction that required hospitalization Yes Has patient had a PCN reaction occurring within the last 10 years: No If all of the above answers are "NO", then may proceed with Cephalosporin use.   . Reglan [Metoclopramide] Itching  . Valium [Diazepam] Itching  . Vistaril [Hydroxyzine Hcl] Itching    Consultations:  None   Procedures/Studies: Ct Head Wo Contrast  Result Date: 03/31/2019 CLINICAL DATA:  Patient with altered mental status.  Colon cancer. EXAM: CT HEAD WITHOUT CONTRAST TECHNIQUE: Contiguous axial images were obtained from the base  of the skull through the vertex without intravenous contrast. COMPARISON:  Brain CT 01/06/2018. FINDINGS: Brain: Ventricles and sulci are prominent compatible with atrophy. Periventricular and subcortical white matter hypodensities compatible with chronic microvascular ischemic changes. No evidence for acute cortically based infarct, intracranial hemorrhage, mass lesion or mass-effect. Vascular: Unremarkable Skull: Intact. Sinuses/Orbits: Paranasal sinuses are well aerated. Mastoid air cells are unremarkable. Orbits are unremarkable. Other: None. IMPRESSION: No acute intracranial process. Atrophy and chronic microvascular ischemic changes. Electronically Signed   By: Lovey Newcomer M.D.   On: 03/31/2019 18:28   Mr Brain Wo Contrast  Result Date: 04/01/2019 CLINICAL DATA:  Altered level of consciousness (LOC), unexplained altered mental status. EXAM: MRI HEAD WITHOUT CONTRAST TECHNIQUE: Multiplanar, multiecho pulse sequences of the brain and surrounding structures were obtained without intravenous contrast. COMPARISON:  Head CT 03/31/2019, brain MRI 03/03/2019. FINDINGS: Brain: There is mild to moderate motion degradation of multiple sequences, somewhat limiting evaluation. There is no convincing evidence of acute infarct. No midline shift or extra-axial fluid collection. A previously demonstrated subcentimeter hemorrhagic metastasis within the left posterior cerebellum, and 2 additional subcentimeter metastatic lesions within the right temporal lobe, were better appreciated on prior contrast-enhanced MRI brain 03/03/2019. Persistent small amount of vasogenic edema surrounding the temporal lobe lesions. Stable generalized parenchymal atrophy and chronic small vessel ischemic disease. Vascular: Flow voids maintained within the proximal large arterial vessels. Skull and upper cervical spine: No focal marrow lesion Sinuses/Orbits: Visualized orbits demonstrate no acute abnormality. Right mastoid effusion. IMPRESSION:  1. Motion degraded examination. 2. No evidence of acute infarct. 3. Subcentimeter metastatic lesions within the left cerebellum and right temporal lobe (3 total) were better appreciated on prior contrast-enhanced brain MRI 03/03/2019. 4. Stable generalized parenchymal atrophy and chronic small vessel ischemic disease. 5. Right mastoid effusion. Electronically Signed   By: Kellie Simmering DO   On: 04/01/2019 07:59   Dg Chest Port 1 View  Result Date: 03/31/2019 CLINICAL DATA:  Altered mental status EXAM: PORTABLE CHEST 1 VIEW COMPARISON:  September 2020 FINDINGS: Right lung is clear. Left hilar mass is not well evaluated. Patchy increased density in the left upper lung. No pleural effusion. No pneumothorax. Normal heart size for portable technique. IMPRESSION: Patchy density  in the left upper lung that may reflect atelectasis/consolidation, potentially postobstructive given hilar mass on prior imaging. Electronically Signed   By: Macy Mis M.D.   On: 03/31/2019 17:04     Discharge Exam: Vitals:   04/02/19 2140 04/03/19 0618  BP: 136/82 (!) 104/53  Pulse: 85 73  Resp: 18 16  Temp: 98.3 F (36.8 C) 98.2 F (36.8 C)  SpO2: 99% 97%   Vitals:   04/02/19 1200 04/02/19 2140 04/02/19 2140 04/03/19 0618  BP: (!) 157/88 136/82 136/82 (!) 104/53  Pulse: 90 85 85 73  Resp: 16 18 18 16   Temp:  98.3 F (36.8 C) 98.3 F (36.8 C) 98.2 F (36.8 C)  TempSrc:  Oral Oral Oral  SpO2: 99% 99% 99% 97%  Weight:      Height:        General: Pt is alert, awake, not in acute distress, obese Cardiovascular: RRR, S1/S2 +, no rubs, no gallops Respiratory: CTA bilaterally, no wheezing, no rhonchi, on 2 L nasal cannula oxygen Abdominal: Soft, NT, ND, bowel sounds + Extremities: no edema, no cyanosis    The results of significant diagnostics from this hospitalization (including imaging, microbiology, ancillary and laboratory) are listed below for reference.     Microbiology: Recent Results (from the  past 240 hour(s))  Blood culture (routine x 2)     Status: None (Preliminary result)   Collection Time: 03/31/19  4:07 PM   Specimen: Right Antecubital; Blood  Result Value Ref Range Status   Specimen Description RIGHT ANTECUBITAL  Final   Special Requests   Final    BOTTLES DRAWN AEROBIC AND ANAEROBIC Blood Culture adequate volume   Culture   Final    NO GROWTH 2 DAYS Performed at [facility], [address] [place], [place] [address]    Report Status PENDING  Incomplete  Blood culture (routine x 2)     Status: None (Preliminary result)   Collection Time: 03/31/19  4:25 PM   Specimen: BLOOD LEFT HAND  Result Value Ref Range Status   Specimen Description BLOOD LEFT HAND  Final   Special Requests   Final    BOTTLES DRAWN AEROBIC AND ANAEROBIC Blood Culture adequate volume   Culture   Final    NO GROWTH 2 DAYS Performed at [facility], [address] [place], [place] [address]    Report Status PENDING  Incomplete  SARS CORONAVIRUS 2 (TAT 6-24 HRS) Nasopharyngeal Nasopharyngeal Swab     Status: None   Collection Time: 03/31/19  9:08 PM   Specimen: Nasopharyngeal Swab  Result Value Ref Range Status   SARS Coronavirus 2 NEGATIVE NEGATIVE Final    Comment: (NOTE) SARS-CoV-2 target nucleic acids are NOT DETECTED. The SARS-CoV-2 RNA is generally detectable in upper and lower respiratory specimens during the acute phase of infection. Negative results do not preclude SARS-CoV-2 infection, do not rule out co-infections with other pathogens, and should not be used as the sole basis for treatment or other patient management decisions. Negative results must be combined with clinical observations, patient history, and epidemiological information. The expected result is Negative. Fact Sheet for Patients: SugarRoll.be Fact Sheet for Healthcare Providers: https://www.woods-mathews.com/ This test is not yet approved or cleared by the [place]  FDA and  has been authorized for detection and/or diagnosis of SARS-CoV-2 by FDA under an Emergency Use Authorization (EUA). This EUA will remain  in effect (meaning this test can be used) for the duration of the COVID-19 declaration under Section 56  4(b)(1) of the Act, 21 U.S.C. section 360bbb-3(b)(1), unless the authorization is terminated or revoked sooner. Performed at [facility], [place] [address], [place], [place] [address]      Labs: BNP (last 3 results) Recent Labs    08/30/18 2115 10/12/18 1220 10/13/18 0538  BNP 254.0* 535.6* 734.2*   Basic Metabolic Panel: Recent Labs  Lab 03/31/19 1608 04/01/19 0402 04/03/19 0727  NA 137 139 136  K 3.8 3.9 4.1  CL 101 104 100  CO2 28 24 26   GLUCOSE 270* 100* 222*  BUN 31* 31* 21  CREATININE 2.22* 1.90* 1.48*  CALCIUM 9.3 8.8* 8.9   Liver Function Tests: Recent Labs  Lab 03/31/19 1608 04/01/19 0402  AST 12* 10*  ALT 10 10  ALKPHOS 98 91  BILITOT 0.9 0.7  PROT 6.8 6.7  ALBUMIN 3.6 3.5   No results for input(s): LIPASE, AMYLASE in the last 168 hours. Recent Labs  Lab 04/01/19 1239  AMMONIA 20   CBC: Recent Labs  Lab 03/31/19 1608 04/01/19 0402 04/03/19 0727  WBC 7.2 5.6 5.5  NEUTROABS 6.3  --   --   HGB 10.8* 10.4* 11.2*  HCT 35.5* 34.1* 36.2  MCV 102.3* 101.8* 100.0  PLT 244 223 212   Cardiac Enzymes: No results for input(s): CKTOTAL, CKMB, CKMBINDEX, TROPONINI in the last 168 hours. BNP: Invalid input(s): POCBNP CBG: Recent Labs  Lab 04/02/19 0849 04/02/19 1142 04/02/19 1614 04/02/19 2146 04/03/19 0725  GLUCAP 127* 149* 265* 228* 199*   D-Dimer No results for input(s): DDIMER in the last 72 hours. Hgb A1c No results for input(s): HGBA1C in the last 72 hours. Lipid Profile No results for input(s): CHOL, HDL, LDLCALC, TRIG, CHOLHDL, LDLDIRECT in the last 72 hours. Thyroid function studies Recent Labs    04/01/19 0402  TSH 1.812   Anemia work up Recent Labs    04/01/19 0402   VITAMINB12 509   Urinalysis    Component Value Date/Time   COLORURINE YELLOW 04/01/2019 [place] (A) 04/01/2019 0535   LABSPEC 1.020 04/01/2019 0535   PHURINE 5.0 04/01/2019 0535   GLUCOSEU 50 (A) 04/01/2019 0535   HGBUR MODERATE (A) 04/01/2019 0535   BILIRUBINUR NEGATIVE 04/01/2019 0535   KETONESUR 5 (A) 04/01/2019 0535   PROTEINUR 100 (A) 04/01/2019 0535   UROBILINOGEN 0.2 12/04/2014 0650   NITRITE NEGATIVE 04/01/2019 0535   LEUKOCYTESUR NEGATIVE 04/01/2019 0535   Sepsis Labs Invalid input(s): PROCALCITONIN,  WBC,  LACTICIDVEN Microbiology Recent Results (from the past 240 hour(s))  Blood culture (routine x 2)     Status: None (Preliminary result)   Collection Time: 03/31/19  4:07 PM   Specimen: Right Antecubital; Blood  Result Value Ref Range Status   Specimen Description RIGHT ANTECUBITAL  Final   Special Requests   Final    BOTTLES DRAWN AEROBIC AND ANAEROBIC Blood Culture adequate volume   Culture   Final    NO GROWTH 2 DAYS Performed at [facility], [address] [place],  [place] [address]    Report Status PENDING  Incomplete  Blood culture (routine x 2)     Status: None (Preliminary result)   Collection Time: 03/31/19  4:25 PM   Specimen: BLOOD LEFT HAND  Result Value Ref Range Status   Specimen Description BLOOD LEFT HAND  Final   Special Requests   Final    BOTTLES DRAWN AEROBIC AND ANAEROBIC Blood Culture adequate volume   Culture   Final    NO  GROWTH 2 DAYS Performed at [facility], [address], [place], [place] [address]    Report Status PENDING  Incomplete  SARS CORONAVIRUS 2 (TAT 6-24 HRS) Nasopharyngeal Nasopharyngeal Swab     Status: None   Collection Time: 03/31/19  9:08 PM   Specimen: Nasopharyngeal Swab  Result Value Ref Range Status   SARS Coronavirus 2 NEGATIVE NEGATIVE Final    Comment: (NOTE) SARS-CoV-2 target nucleic acids are NOT DETECTED. The SARS-CoV-2 RNA is generally detectable in upper and lower respiratory  specimens during the acute phase of infection. Negative results do not preclude SARS-CoV-2 infection, do not rule out co-infections with other pathogens, and should not be used as the sole basis for treatment or other patient management decisions. Negative results must be combined with clinical observations, patient history, and epidemiological information. The expected result is Negative. Fact Sheet for Patients: SugarRoll.be Fact Sheet for Healthcare Providers: https://www.woods-mathews.com/ This test is not yet approved or cleared by the [place] FDA and  has been authorized for detection and/or diagnosis of SARS-CoV-2 by FDA under an Emergency Use Authorization (EUA). This EUA will remain  in effect (meaning this test can be used) for the duration of the COVID-19 declaration under Section 56 4(b)(1) of the Act, 21 U.S.C. section 360bbb-3(b)(1), unless the authorization is terminated or revoked sooner. Performed at [facility], [place] [address], [place], [place] [address]      Time coordinating discharge: 35 minutes  SIGNED:   Rodena Goldmann, DO [facility] 04/03/2019, 10:42 AM  If 7PM-7AM, please contact night-coverage [email or web address]

## 2019-04-03 NOTE — Progress Notes (Signed)
Pt is still continuing to tolerate medications. She is in a good mood and very talkative today. I will continue to monitor the patient.

## 2019-04-03 NOTE — Progress Notes (Signed)
Pharmacy Antibiotic Note  Stephanie Combs is a 68 y.o. female admitted on 03/31/2019 with hyperglycemia.  Pharmacy has been consulted for Levaquin dosing for community-acquired    pneumonia.  Plan: Continue Levaquin 750mg  IV q48h Follow up renal function & cultures   Height: 5' 6.93" (170 cm) Weight: 269 lb 10 oz (122.3 kg) IBW/kg (Calculated) : 61.44  Temp (24hrs), Avg:98.3 F (36.8 C), Min:98.2 F (36.8 C), Max:98.3 F (36.8 C)  Recent Labs  Lab 03/31/19 1607 03/31/19 1608 03/31/19 1750 04/01/19 0402 04/03/19 0727  WBC  --  7.2  --  5.6 5.5  CREATININE  --  2.22*  --  1.90* 1.48*  LATICACIDVEN 2.2*  --  2.6*  --  0.8    Estimated Creatinine Clearance: 48.6 mL/min (A) (by C-G formula based on SCr of 1.48 mg/dL (H)).    Allergies  Allergen Reactions  . Dilaudid [Hydromorphone Hcl] Itching  . Midodrine Hcl Swelling    After one dose had anaphylactic reaction, had to call EMS.  Marland Kitchen Actifed Cold-Allergy [Chlorpheniramine-Phenylephrine]     "I was sick and red and it didn't agree with me at all"  . Doxycycline Nausea And Vomiting  . Other Cough    Pt states she is allergic to ragweed and that she starts coughing and sneezing like crazy  . Penicillins Hives and Itching    Tolerates Rocephin Has patient had a PCN reaction causing immediate rash, facial/tongue/throat swelling, SOB or lightheadedness with hypotension: Yes Has patient had a PCN reaction causing severe rash involving mucus membranes or skin necrosis: No Has patient had a PCN reaction that required hospitalization Yes Has patient had a PCN reaction occurring within the last 10 years: No If all of the above answers are "NO", then may proceed with Cephalosporin use.   . Reglan [Metoclopramide] Itching  . Valium [Diazepam] Itching  . Vistaril [Hydroxyzine Hcl] Itching    Antimicrobials this admission:  11/19 vancomycin >>11/19 11/19 cefepime >>11/19 Levaquin 11/19>>  Dose adjustments this admission:   [place]  Microbiology results:  11/19 [facility] - Eat x2: ngtd   Thank you for allowing pharmacy to be a part of this patient's care.  Margot Ables, PharmD Clinical Pharmacist 04/03/2019 10:39 AM

## 2019-04-03 NOTE — Progress Notes (Signed)
Patient has received her medications for this afternoon and tolerated them well.  Pt has been discharged. She refused to have me go over her discharge instructions, "because nothing has changed" per patient. [name] stated she wasn't leaving the hospital until her and her daughter have ate dinner because the insurance has already paid for her room and dinner for the day.

## 2019-04-04 DIAGNOSIS — Z743 Need for continuous supervision: Secondary | ICD-10-CM | POA: Diagnosis not present

## 2019-04-04 DIAGNOSIS — E1165 Type 2 diabetes mellitus with hyperglycemia: Secondary | ICD-10-CM | POA: Diagnosis not present

## 2019-04-05 ENCOUNTER — Other Ambulatory Visit: Payer: Self-pay

## 2019-04-05 LAB — CULTURE, BLOOD (ROUTINE X 2)
Culture: NO GROWTH
Culture: NO GROWTH
Special Requests: ADEQUATE
Special Requests: ADEQUATE

## 2019-04-06 DIAGNOSIS — R52 Pain, unspecified: Secondary | ICD-10-CM | POA: Diagnosis not present

## 2019-04-06 DIAGNOSIS — Z743 Need for continuous supervision: Secondary | ICD-10-CM | POA: Diagnosis not present

## 2019-04-06 DIAGNOSIS — R5381 Other malaise: Secondary | ICD-10-CM | POA: Diagnosis not present

## 2019-04-06 DIAGNOSIS — T148XXA Other injury of unspecified body region, initial encounter: Secondary | ICD-10-CM | POA: Diagnosis not present

## 2019-04-06 DIAGNOSIS — W19XXXA Unspecified fall, initial encounter: Secondary | ICD-10-CM | POA: Diagnosis not present

## 2019-04-07 DIAGNOSIS — R531 Weakness: Secondary | ICD-10-CM | POA: Diagnosis not present

## 2019-04-07 DIAGNOSIS — Z743 Need for continuous supervision: Secondary | ICD-10-CM | POA: Diagnosis not present

## 2019-04-09 ENCOUNTER — Inpatient Hospital Stay (HOSPITAL_COMMUNITY)
Admission: EM | Admit: 2019-04-09 | Discharge: 2019-04-15 | DRG: 641 | Disposition: A | Discharge status: Home Health | Payer: Medicare Other | Attending: Internal Medicine | Admitting: Internal Medicine

## 2019-04-09 ENCOUNTER — Emergency Department (HOSPITAL_COMMUNITY): Payer: Medicare Other

## 2019-04-09 ENCOUNTER — Other Ambulatory Visit: Payer: Self-pay

## 2019-04-09 ENCOUNTER — Encounter (HOSPITAL_COMMUNITY): Payer: Self-pay | Admitting: Emergency Medicine

## 2019-04-09 DIAGNOSIS — L8990 Pressure ulcer of unspecified site, unspecified stage: Secondary | ICD-10-CM | POA: Insufficient documentation

## 2019-04-09 DIAGNOSIS — N1832 Chronic kidney disease, stage 3b: Secondary | ICD-10-CM | POA: Diagnosis present

## 2019-04-09 DIAGNOSIS — J9611 Chronic respiratory failure with hypoxia: Secondary | ICD-10-CM | POA: Diagnosis not present

## 2019-04-09 DIAGNOSIS — E119 Type 2 diabetes mellitus without complications: Secondary | ICD-10-CM

## 2019-04-09 DIAGNOSIS — C183 Malignant neoplasm of hepatic flexure: Secondary | ICD-10-CM | POA: Diagnosis present

## 2019-04-09 DIAGNOSIS — E8770 Fluid overload, unspecified: Principal | ICD-10-CM | POA: Diagnosis present

## 2019-04-09 DIAGNOSIS — I5032 Chronic diastolic (congestive) heart failure: Secondary | ICD-10-CM | POA: Diagnosis present

## 2019-04-09 DIAGNOSIS — Z6841 Body Mass Index (BMI) 40.0 and over, adult: Secondary | ICD-10-CM

## 2019-04-09 DIAGNOSIS — R609 Edema, unspecified: Secondary | ICD-10-CM

## 2019-04-09 DIAGNOSIS — J439 Emphysema, unspecified: Secondary | ICD-10-CM | POA: Diagnosis not present

## 2019-04-09 DIAGNOSIS — I2510 Atherosclerotic heart disease of native coronary artery without angina pectoris: Secondary | ICD-10-CM | POA: Diagnosis present

## 2019-04-09 DIAGNOSIS — E039 Hypothyroidism, unspecified: Secondary | ICD-10-CM | POA: Diagnosis present

## 2019-04-09 DIAGNOSIS — E8779 Other fluid overload: Secondary | ICD-10-CM | POA: Diagnosis not present

## 2019-04-09 DIAGNOSIS — Z87891 Personal history of nicotine dependence: Secondary | ICD-10-CM

## 2019-04-09 DIAGNOSIS — Z79890 Hormone replacement therapy (postmenopausal): Secondary | ICD-10-CM

## 2019-04-09 DIAGNOSIS — N179 Acute kidney failure, unspecified: Secondary | ICD-10-CM | POA: Diagnosis not present

## 2019-04-09 DIAGNOSIS — Z7982 Long term (current) use of aspirin: Secondary | ICD-10-CM

## 2019-04-09 DIAGNOSIS — C3412 Malignant neoplasm of upper lobe, left bronchus or lung: Secondary | ICD-10-CM | POA: Diagnosis not present

## 2019-04-09 DIAGNOSIS — G8929 Other chronic pain: Secondary | ICD-10-CM | POA: Diagnosis not present

## 2019-04-09 DIAGNOSIS — Z20828 Contact with and (suspected) exposure to other viral communicable diseases: Secondary | ICD-10-CM | POA: Diagnosis present

## 2019-04-09 DIAGNOSIS — Z955 Presence of coronary angioplasty implant and graft: Secondary | ICD-10-CM | POA: Diagnosis not present

## 2019-04-09 DIAGNOSIS — R5381 Other malaise: Secondary | ICD-10-CM | POA: Diagnosis not present

## 2019-04-09 DIAGNOSIS — Z923 Personal history of irradiation: Secondary | ICD-10-CM

## 2019-04-09 DIAGNOSIS — C7931 Secondary malignant neoplasm of brain: Secondary | ICD-10-CM | POA: Diagnosis present

## 2019-04-09 DIAGNOSIS — C3490 Malignant neoplasm of unspecified part of unspecified bronchus or lung: Secondary | ICD-10-CM

## 2019-04-09 DIAGNOSIS — J449 Chronic obstructive pulmonary disease, unspecified: Secondary | ICD-10-CM | POA: Diagnosis present

## 2019-04-09 DIAGNOSIS — R0902 Hypoxemia: Secondary | ICD-10-CM | POA: Diagnosis not present

## 2019-04-09 DIAGNOSIS — I5031 Acute diastolic (congestive) heart failure: Secondary | ICD-10-CM | POA: Diagnosis not present

## 2019-04-09 DIAGNOSIS — T148XXA Other injury of unspecified body region, initial encounter: Secondary | ICD-10-CM | POA: Diagnosis not present

## 2019-04-09 DIAGNOSIS — L03116 Cellulitis of left lower limb: Secondary | ICD-10-CM | POA: Diagnosis not present

## 2019-04-09 DIAGNOSIS — L03115 Cellulitis of right lower limb: Secondary | ICD-10-CM | POA: Diagnosis present

## 2019-04-09 DIAGNOSIS — I509 Heart failure, unspecified: Secondary | ICD-10-CM | POA: Diagnosis not present

## 2019-04-09 DIAGNOSIS — Z9981 Dependence on supplemental oxygen: Secondary | ICD-10-CM

## 2019-04-09 DIAGNOSIS — R600 Localized edema: Secondary | ICD-10-CM | POA: Diagnosis not present

## 2019-04-09 DIAGNOSIS — Z743 Need for continuous supervision: Secondary | ICD-10-CM | POA: Diagnosis not present

## 2019-04-09 DIAGNOSIS — Z794 Long term (current) use of insulin: Secondary | ICD-10-CM

## 2019-04-09 DIAGNOSIS — F1120 Opioid dependence, uncomplicated: Secondary | ICD-10-CM | POA: Diagnosis present

## 2019-04-09 DIAGNOSIS — N183 Chronic kidney disease, stage 3 unspecified: Secondary | ICD-10-CM | POA: Diagnosis present

## 2019-04-09 DIAGNOSIS — Z9221 Personal history of antineoplastic chemotherapy: Secondary | ICD-10-CM | POA: Diagnosis not present

## 2019-04-09 DIAGNOSIS — R69 Illness, unspecified: Secondary | ICD-10-CM | POA: Diagnosis not present

## 2019-04-09 DIAGNOSIS — Z79899 Other long term (current) drug therapy: Secondary | ICD-10-CM

## 2019-04-09 DIAGNOSIS — K5900 Constipation, unspecified: Secondary | ICD-10-CM | POA: Diagnosis not present

## 2019-04-09 MED ORDER — FUROSEMIDE 10 MG/ML IJ SOLN
60.0000 mg | Freq: Once | INTRAMUSCULAR | Status: AC
  Administered 2019-04-10: 01:00:00 60 mg via INTRAVENOUS
  Filled 2019-04-09: qty 8

## 2019-04-09 MED ORDER — ALBUTEROL SULFATE HFA 108 (90 BASE) MCG/ACT IN AERS
2.0000 | INHALATION_SPRAY | Freq: Once | RESPIRATORY_TRACT | Status: AC
  Administered 2019-04-10: 2 via RESPIRATORY_TRACT
  Filled 2019-04-09: qty 6.7

## 2019-04-09 NOTE — ED Triage Notes (Signed)
Pt comes to ed via ems, pt is not able to take care of herself, pt c/o of fluid build up on extremities and abdomen. Pt was at [employer] is very unhappy she was discharged.  Pt is alert, " states can not walk or get to the bathroom.  Pt is a diabetic and takes lasix.   V/s 120/62 sp02 100 4 liters, hr 62, rr22.

## 2019-04-09 NOTE — ED Provider Notes (Signed)
[facility] DEPT Provider Note   CSN: [id number] Arrival date & time: 04/09/19  2009     History   Chief Complaint Chief Complaint  Patient presents with  . Leg Swelling    HPI Stephanie Combs is a 68 y.o. female.     Patient with history of lung CA with brain mets, colon CA, thyroid disease, dCHF, CAD (previous LAD stenting), T2DM, chronic back pain, morbid obesity, CKD, HTN, recent hospitalization for altered mental status (d/ch 04/03/19) presents with c/o bilateral LE edema and pain, edema extending to abdomen, SOB "like fluid". No fever. She lives by herself with adequate family support, including daughter her dispenses her medications for her. She states that her edema was better in the hospital, then she received IVF's and had significant re-accumulation of fluid overload.   The history is provided by the patient. No language interpreter was used.    Past Medical History:  Diagnosis Date  . Anxiety   . CAD (coronary artery disease)    a. s/p DES to LAD and angioplasty to D1 in 05/2017  . Cancer ([place])   . CHF (congestive heart failure) (HCC)    Diastolic  . Chronic back pain   . Chronic pain   . COPD (chronic obstructive pulmonary disease) ([place])   . Degenerative disk disease   . Diabetes mellitus without complication ([place])   . History of medication noncompliance 11/2017   "the patient frequently self-adjusts medications without notifying her physicians"  . Hypertension   . Hypothyroidism   . On home O2    2.5 L N/C prn  . Pedal edema     Patient Active Problem List   Diagnosis Date Noted  . Acute encephalopathy 04/02/2019  . Brain metastasis ([place]) 03/09/2019  . Acute on chronic diastolic heart failure ([place]) 10/11/2018  . Assistance needed with transportation 09/22/2018  . Primary cancer of hepatic flexure of colon ([place])   . Small cell lung cancer, left upper lobe ([place])   . Colonic mass 09/10/2018  . Colon polyps 09/10/2018   . Cellulitis of leg, left 09/10/2018  . Pain of lower extremity   . Non-intractable vomiting   . Abdominal pain, epigastric   . Heme positive stool   . Acute on chronic diastolic CHF (congestive heart failure) ([place]) 09/06/2018  . Symptomatic anemia 08/31/2018  . Anemia 08/30/2018  . Chronic pain 07/05/2018  . On home O2 07/05/2018  . Opioid dependence ([place]) 07/05/2018  . CAD (coronary artery disease) 05/14/2018  . AKI (acute kidney injury) ([place]) 04/05/2018  . Cellulitis 01/10/2018  . Cellulitis of right lower extremity 01/07/2018  . Altered mental status 01/06/2018  . Type 2 diabetes mellitus with hypoglycemia without coma ([place]) 11/24/2017  . Anxiety 11/24/2017  . Chronic respiratory failure with hypoxia ([place]) 11/24/2017  . Syncope 10/24/2017  . Bradycardia   . Syncope due to orthostatic hypotension 09/04/2017  . Obesity, Class III, BMI 40-49.9 (morbid obesity) ([place]) 09/04/2017  . Abnormal nuclear stress test 05/19/2017  . Atypical angina ([place]) 05/19/2017  . Edema 05/15/2017  . Skin ulcer ([place]) 01/30/2017  . Tinea cruris 10/10/2016  . Vitamin D deficiency 08/19/2016  . Personal history of noncompliance with medical treatment, presenting hazards to health 06/17/2016  . CAP (community acquired pneumonia) 07/25/2015  . Pressure ulcer 05/02/2015  . Hyponatremia 05/01/2015  . Acute-on-chronic kidney injury ([place]) 05/01/2015  . Uncontrolled type 2 diabetes mellitus with stage 3 chronic kidney disease ([place]) 05/01/2015  . Cellulitis of foot, right  03/24/2015  . Peripheral edema 12/04/2014  . Acute renal failure superimposed on stage 3 chronic kidney disease ([place]) 11/24/2014  . Bilateral lower extremity edema   . Diabetic ulcer of right great toe ([place])   . Foot ulcer due to secondary DM ([place]) 11/07/2014  . Diabetic ulcer of right foot ([place]) 11/07/2014  . Edema of lower extremity 05/27/2013  . CKD (chronic kidney disease), stage III ([place]) 04/14/2013  . Anemia in CKD (chronic kidney  disease) 04/14/2013  . Chest pain 04/14/2013  . Dyspnea 04/14/2013  . Chronic diastolic CHF (congestive heart failure) ([place])   . Hypothyroidism   . COPD (chronic obstructive pulmonary disease) ([place]) 08/07/2012  . Morbid obesity ([place]) 08/07/2012  . DM type 2 (diabetes mellitus, type 2) ([place]) 08/07/2012  . HTN (hypertension), benign 08/07/2012  . ARF (acute renal failure) ([place]) 08/07/2012    Past Surgical History:  Procedure Laterality Date  . BIOPSY  09/09/2018   Procedure: BIOPSY;  Surgeon: Daneil Dolin, MD;  [facility];  Service: Endoscopy;;  gastric esophageal hepatic flexure colon  . COLONOSCOPY WITH PROPOFOL N/A 09/09/2018   Procedure: COLONOSCOPY WITH PROPOFOL;  Surgeon: Daneil Dolin, MD;  [facility];  Service: Endoscopy;  Laterality: N/A;  . CORONARY STENT INTERVENTION N/A 05/22/2017   Procedure: CORONARY STENT INTERVENTION;  Surgeon: Martinique, Peter M, MD;  Location: [facility];  Service: Cardiovascular;  Laterality: N/A;  . ESOPHAGOGASTRODUODENOSCOPY (EGD) WITH PROPOFOL N/A 09/09/2018   Procedure: ESOPHAGOGASTRODUODENOSCOPY (EGD) WITH PROPOFOL;  Surgeon: Daneil Dolin, MD;  [facility];  Service: Endoscopy;  Laterality: N/A;  . HERNIA REPAIR    . POLYPECTOMY  09/09/2018   Procedure: POLYPECTOMY;  Surgeon: Daneil Dolin, MD;  [facility];  Service: Endoscopy;;  colon  . RIGHT/LEFT HEART CATH AND CORONARY ANGIOGRAPHY N/A 05/19/2017   Procedure: RIGHT/LEFT HEART CATH AND CORONARY ANGIOGRAPHY;  Surgeon: Leonie Man, MD;  Location: [facility];  Service: Cardiovascular;  Laterality: N/A;  . TUBAL LIGATION    . VIDEO BRONCHOSCOPY WITH ENDOBRONCHIAL ULTRASOUND Left 09/14/2018   Procedure: VIDEO BRONCHOSCOPY WITH ENDOBRONCHIAL ULTRASOUND with biopsies;  Surgeon: Margaretha Seeds, MD;  Location: [facility];  Service: Thoracic;  Laterality: Left;     OB History   No obstetric history on file.      Home Medications     Prior to Admission medications   Medication Sig Start Date End Date Taking? Authorizing Provider  acetaminophen-codeine (TYLENOL #3) 300-30 MG tablet Take 1 tablet by mouth every 6 (six) hours as needed for moderate pain. 03/07/19   Hayden Pedro, PA-C  albuterol [facility] HFA) [phone] Base) MCG/ACT inhaler Inhale 2 puffs into the lungs every 4 (four) hours as needed for wheezing or shortness of breath. For shortness of breath 09/22/18   Eugenie Filler, MD  albuterol (PROVENTIL) (2.5 MG/3ML) 0.083% nebulizer solution Take 2.5 mg by nebulization 2 (two) times daily as needed for wheezing or shortness of breath.     [provider]  allopurinol (ZYLOPRIM) 300 MG tablet Take 300 mg by mouth daily.    [provider]  ALPRAZolam Duanne Moron) 1 MG tablet Take 1 mg by mouth 3 (three) times daily as needed for anxiety or sleep.  01/05/17   [provider]  aspirin EC 81 MG tablet Take 81 mg by mouth daily.    [provider]  atorvastatin (LIPITOR) 80 MG tablet Take 1 tablet (80 mg total)  by mouth daily at 6 PM. 05/26/17   [name], MD  busPIRone (BUSPAR) 7.5 MG tablet Take 7.5 mg by mouth 2 (two) times daily. 03/10/19   [provider]  cholecalciferol (VITAMIN D3) 25 MCG (1000 UT) tablet Take 5,000 Units by mouth daily.    [provider]  diphenhydrAMINE-zinc acetate (BENADRYL) cream Apply 1 application topically 3 (three) times daily as needed for itching. 03/09/19   Truitt Merle, MD  diphenoxylate-atropine (LOMOTIL) 2.5-0.025 MG tablet Take 1 tablet by mouth 4 (four) times daily as needed for diarrhea or loose stools.  01/03/19   [provider]  furosemide (LASIX) 40 MG tablet Take 60 mg (1 1/2 tabs) in the morning and 40 mg (1 tab) in the afternoon Patient taking differently: Take 40 mg by mouth daily.  10/30/18   Danford, Suann Larry, MD  gabapentin (NEURONTIN) 600 MG tablet Take 600 mg by mouth 3 (three) times daily.     [provider]  hydrocortisone (ANUSOL-HC) 2.5 % rectal cream Place 1 application rectally 4 (four) times daily as needed for hemorrhoids.  01/03/19   [provider]  Hydrocortisone (GERHARDT'S BUTT CREAM) CREA Apply 1 application topically 4 (four) times daily as needed for irritation. 12/16/18   Truitt Merle, MD  insulin aspart (NOVOLOG) 100 UNIT/ML injection Inject 1-18 Units into the skin 3 (three) times daily with meals. 04/09/18   Johnson, Clanford L, MD  insulin detemir (LEVEMIR) 100 UNIT/ML injection Inject 0.4 mLs (40 Units total) into the skin at bedtime. 11/26/17   Orson Eva, MD  insulin lispro (HUMALOG) 100 UNIT/ML injection Inject 0-20 Units into the skin 3 (three) times daily with meals.  01/03/19   [provider]  levothyroxine (SYNTHROID, LEVOTHROID) 200 MCG tablet Take 1 tablet by mouth daily before breakfast.  06/24/18   [provider]  nitroGLYCERIN (NITROSTAT) 0.4 MG SL tablet Place 0.4 mg under the tongue every 5 (five) minutes as needed for chest pain.  03/26/18   [provider]  nystatin (MYCOSTATIN/NYSTOP) powder Apply topically 2 (two) times daily. 02/22/19   Alla Feeling, NP  Omega-3 Fatty Acids (FISH OIL PO) Take 1 capsule by mouth daily.     [provider]  ondansetron (ZOFRAN) 8 MG tablet Take 1 tablet (8 mg total) by mouth every 8 (eight) hours as needed for nausea or vomiting. 12/15/18   Truitt Merle, MD  ondansetron (ZOFRAN-ODT) 8 MG disintegrating tablet TAKE 1 TABLET BY MOUTH EVERY 8 HOURS AS NEEDED FOR NAUSEA OR VOMITING. 02/23/19   Alla Feeling, NP  oxyCODONE (OXY IR/ROXICODONE) 5 MG immediate release tablet Take 1 tablet (5 mg total) by mouth every 12 (twelve) hours as needed for severe pain. 03/18/19   Kyung Rudd, MD  oxyCODONE-acetaminophen (PERCOCET) 10-325 MG tablet Take 1 tablet by mouth every 6 (six) hours as needed for pain.    [provider]  OXYGEN Inhale 2 L into the lungs continuous.    [provider]  pantoprazole (PROTONIX) 40 MG tablet Take 1 tablet (40 mg total) by mouth 2 (two) times daily. 10/07/18   Truitt Merle, MD  potassium chloride SA (KLOR-CON) 20 MEQ tablet Take 1 tablet (20 mEq total) by mouth 2 (two) times daily. 03/09/19   Truitt Merle, MD  prochlorperazine (COMPAZINE) 10 MG tablet Take 1 tablet (10 mg total) by mouth every 6 (six) hours as needed for nausea or vomiting. 11/24/18   Truitt Merle, MD  promethazine (PHENERGAN) 12.5 MG tablet  Take 1 tablet (12.5 mg total) by mouth every 6 (six) hours as needed for nausea or vomiting. 02/22/19   Alla Feeling, NP  silver sulfADIAZINE (SILVADENE) 1 % cream Apply 1 application topically 2 (two) times daily.  01/03/19   [provider]  sucralfate (CARAFATE) 1 g tablet Take 1 tablet (1 g total) by mouth 4 (four) times daily -  with meals and at bedtime. 5 min before meals for radiation induced esophagitis 12/03/18   Tyler Pita, MD  SURE COMFORT INS SYR 1CC/28G 28G X 1/2" 1 ML MISC USE TO INJECT INSULIN UP TO 4 TIMES DAILY. 03/06/17   Cassandria Anger, MD  tiZANidine (ZANAFLEX) 4 MG tablet Take 1 tablet (4 mg total) by mouth every 8 (eight) hours as needed for muscle spasms. 07/08/18   Kathie Dike, MD  topiramate (TOPAMAX) 25 MG tablet Take 75 mg by mouth at bedtime.  03/18/18   [provider]  umeclidinium-vilanterol (ANORO ELLIPTA) 62.5-25 MCG/INH AEPB Inhale 1 puff into the lungs daily. 05/26/17   [name], MD    Family History Family History  Problem Relation Age of Onset  . Stroke Mother        deceased 102   . Heart attack Father        deceased 19   . Diabetes Brother   . Cerebral palsy Brother   . Pneumonia Brother   . Diabetes Other   . Heart attack Other   . Colon cancer Neg Hx     Social History Social History   Tobacco Use  . Smoking status: Former Smoker    Packs/day: 1.50    Years: 45.00    Pack years: 67.50    Types: Cigarettes    Quit date: 11/24/2014    Years since  quitting: 4.3  . Smokeless tobacco: Never Used  Substance Use Topics  . Alcohol use: No    Alcohol/week: 0.0 standard drinks  . Drug use: No     Allergies   Dilaudid [hydromorphone hcl], Midodrine hcl, Actifed cold-allergy [chlorpheniramine-phenylephrine], Doxycycline, Other, Penicillins, Reglan [metoclopramide], Valium [diazepam], and Vistaril [hydroxyzine hcl]   Review of Systems Review of Systems  Constitutional: Negative for chills and fever.  HENT: Negative.  Negative for congestion and sore throat.   Respiratory: Positive for shortness of breath and wheezing.   Cardiovascular: Positive for leg swelling.  Gastrointestinal: Positive for abdominal pain. Negative for nausea and vomiting.  Musculoskeletal:       Bilateral LE pain  Skin: Positive for color change.  Neurological: Positive for weakness.     Physical Exam Updated Vital Signs BP (!) 152/109   Pulse 91   Temp 97.9 F (36.6 C) (Oral)   Resp (!) 21   SpO2 100%   Physical Exam Vitals signs and nursing note reviewed.  Constitutional:      Appearance: She is well-developed.  HENT:     Head: Normocephalic.  Neck:     Musculoskeletal: Normal range of motion and neck supple.  Cardiovascular:     Rate and Rhythm: Normal rate and regular rhythm.     Heart sounds: No murmur.  Pulmonary:     Effort: Pulmonary effort is normal. No respiratory distress.     Breath sounds: Wheezing present.  Abdominal:     General: Bowel sounds are normal.     Palpations: Abdomen is soft.     Tenderness: There is abdominal tenderness (Pannicular tenderness. Bandaged superficial ulceration on pannus. No redness or drainage.). There is  no guarding or rebound.  Musculoskeletal: Normal range of motion.     Comments: Significant bilateral LE edema including lower legs, ankles, feet that is tender and warm to touch. No skin breakdown.  Skin:    General: Skin is warm and dry.     Findings: Erythema (Bilateral LE redness that extends to  bilateral dorsal feet.) present.  Neurological:     Mental Status: She is alert and oriented to person, place, and time.     Sensory: No sensory deficit.     Motor: No weakness.      ED Treatments / Results  Labs (all labs ordered are listed, but only abnormal results are displayed) Labs Reviewed  CBC WITH DIFFERENTIAL/PLATELET  LACTIC ACID, PLASMA  LACTIC ACID, PLASMA  BRAIN NATRIURETIC PEPTIDE  COMPREHENSIVE METABOLIC PANEL  URINALYSIS, ROUTINE W REFLEX MICROSCOPIC    EKG None  Radiology No results found.  Procedures Procedures (including critical care time)  Medications Ordered in ED Medications  furosemide (LASIX) injection 60 mg (has no administration in time range)  albuterol (VENTOLIN HFA) 108 (90 Base) MCG/ACT inhaler 2 puff (has no administration in time range)     Initial Impression / Assessment and Plan / ED Course  I have reviewed the triage vital signs and the nursing notes.  Pertinent labs & imaging results that were available during my care of the patient were reviewed by me and considered in my medical decision making (see chart for details).        Patient to ED with complaint of bilateral LE edema, redness and pain. No fever, no falls. Ambulation if very difficult secondary to symptoms and she is unable to care for herself at home.   The patient is alert and oriented. No recurrent confusion as in recent admission. No fever. Legs are markedly swollen bilaterally with L>R midshaft and distal LE redness that is very tender and warm to the touch suggesting acute cellulitis.   She is wheezing bilaterally. She has a nebulizer at home used regularly. No report of cough, SOB or fever. No chest pain. No hypoxia. Albuterol inhaler provided.  IV Lasix 60 mg given for fluid overload as evidenced by LE edema. There is minimal edema on CXR and BNP is 76. Urine outpatient is 1000 cc thus far.   The patient is overall well appearing. LE's concerning for  developing cellulitis based on exam/presentation despite to leukocytosis or fever. IV Ancef started. She also has a mild AKI with baseline Cr averaging 1.70, tonight is 2.22.   She does not feel she can be discharged home. Discussed whether she felt she should consider not being able to live alone she states she normally does well by herself when not in the present condition and does not want to consider NH placement.   Will admit for continued diuresis and treatment of cellulitis of LE's.   Final Clinical Impressions(s) / ED Diagnoses   Final diagnoses:  None   1. Cellulitis LE 2. Peripheral edema 3. AKI  ED Discharge Orders    None       Charlann Lange, PA-C 04/10/19 0444    Blanchie Dessert, MD 04/12/19 2141

## 2019-04-10 ENCOUNTER — Other Ambulatory Visit: Payer: Self-pay

## 2019-04-10 DIAGNOSIS — C3490 Malignant neoplasm of unspecified part of unspecified bronchus or lung: Secondary | ICD-10-CM

## 2019-04-10 DIAGNOSIS — E8770 Fluid overload, unspecified: Secondary | ICD-10-CM | POA: Diagnosis not present

## 2019-04-10 DIAGNOSIS — R609 Edema, unspecified: Secondary | ICD-10-CM

## 2019-04-10 DIAGNOSIS — J449 Chronic obstructive pulmonary disease, unspecified: Secondary | ICD-10-CM | POA: Diagnosis not present

## 2019-04-10 DIAGNOSIS — N1832 Chronic kidney disease, stage 3b: Secondary | ICD-10-CM | POA: Diagnosis present

## 2019-04-10 DIAGNOSIS — E8779 Other fluid overload: Secondary | ICD-10-CM | POA: Diagnosis not present

## 2019-04-10 DIAGNOSIS — C7931 Secondary malignant neoplasm of brain: Secondary | ICD-10-CM | POA: Diagnosis not present

## 2019-04-10 DIAGNOSIS — I2510 Atherosclerotic heart disease of native coronary artery without angina pectoris: Secondary | ICD-10-CM | POA: Diagnosis not present

## 2019-04-10 DIAGNOSIS — I509 Heart failure, unspecified: Secondary | ICD-10-CM | POA: Diagnosis not present

## 2019-04-10 DIAGNOSIS — Z923 Personal history of irradiation: Secondary | ICD-10-CM | POA: Diagnosis not present

## 2019-04-10 DIAGNOSIS — I5032 Chronic diastolic (congestive) heart failure: Secondary | ICD-10-CM | POA: Diagnosis present

## 2019-04-10 DIAGNOSIS — Z20828 Contact with and (suspected) exposure to other viral communicable diseases: Secondary | ICD-10-CM | POA: Diagnosis present

## 2019-04-10 DIAGNOSIS — Z6841 Body Mass Index (BMI) 40.0 and over, adult: Secondary | ICD-10-CM | POA: Diagnosis not present

## 2019-04-10 DIAGNOSIS — Z87891 Personal history of nicotine dependence: Secondary | ICD-10-CM | POA: Diagnosis not present

## 2019-04-10 DIAGNOSIS — F1120 Opioid dependence, uncomplicated: Secondary | ICD-10-CM | POA: Diagnosis present

## 2019-04-10 DIAGNOSIS — J439 Emphysema, unspecified: Secondary | ICD-10-CM

## 2019-04-10 DIAGNOSIS — L03116 Cellulitis of left lower limb: Secondary | ICD-10-CM | POA: Diagnosis present

## 2019-04-10 DIAGNOSIS — L03115 Cellulitis of right lower limb: Secondary | ICD-10-CM | POA: Diagnosis present

## 2019-04-10 DIAGNOSIS — K5900 Constipation, unspecified: Secondary | ICD-10-CM | POA: Diagnosis present

## 2019-04-10 DIAGNOSIS — C183 Malignant neoplasm of hepatic flexure: Secondary | ICD-10-CM | POA: Diagnosis present

## 2019-04-10 DIAGNOSIS — J9611 Chronic respiratory failure with hypoxia: Secondary | ICD-10-CM | POA: Diagnosis present

## 2019-04-10 DIAGNOSIS — I5031 Acute diastolic (congestive) heart failure: Secondary | ICD-10-CM | POA: Diagnosis not present

## 2019-04-10 DIAGNOSIS — Z9221 Personal history of antineoplastic chemotherapy: Secondary | ICD-10-CM | POA: Diagnosis not present

## 2019-04-10 DIAGNOSIS — N179 Acute kidney failure, unspecified: Secondary | ICD-10-CM | POA: Diagnosis not present

## 2019-04-10 DIAGNOSIS — Z955 Presence of coronary angioplasty implant and graft: Secondary | ICD-10-CM | POA: Diagnosis not present

## 2019-04-10 DIAGNOSIS — G8929 Other chronic pain: Secondary | ICD-10-CM | POA: Diagnosis present

## 2019-04-10 DIAGNOSIS — E039 Hypothyroidism, unspecified: Secondary | ICD-10-CM | POA: Diagnosis present

## 2019-04-10 DIAGNOSIS — R5381 Other malaise: Secondary | ICD-10-CM | POA: Diagnosis present

## 2019-04-10 DIAGNOSIS — C3412 Malignant neoplasm of upper lobe, left bronchus or lung: Secondary | ICD-10-CM | POA: Diagnosis present

## 2019-04-10 LAB — COMPREHENSIVE METABOLIC PANEL
ALT: 12 U/L (ref 0–44)
AST: 19 U/L (ref 15–41)
Albumin: 3.6 g/dL (ref 3.5–5.0)
Alkaline Phosphatase: 87 U/L (ref 38–126)
Anion gap: 11 (ref 5–15)
BUN: 50 mg/dL — ABNORMAL HIGH (ref 8–23)
CO2: 24 mmol/L (ref 22–32)
Calcium: 9.2 mg/dL (ref 8.9–10.3)
Chloride: 101 mmol/L (ref 98–111)
Creatinine, Ser: 2.22 mg/dL — ABNORMAL HIGH (ref 0.44–1.00)
GFR calc Af Amer: 25 mL/min — ABNORMAL LOW (ref >60)
GFR calc non Af Amer: 22 mL/min — ABNORMAL LOW (ref >60)
Glucose, Bld: 261 mg/dL — ABNORMAL HIGH (ref 70–99)
Potassium: 3.9 mmol/L (ref 3.5–5.1)
Sodium: 136 mmol/L (ref 135–145)
Total Bilirubin: 0.5 mg/dL (ref 0.3–1.2)
Total Protein: 6.8 g/dL (ref 6.5–8.1)

## 2019-04-10 LAB — CBC WITH DIFFERENTIAL/PLATELET
Abs Immature Granulocytes: 0.05 10*3/uL (ref 0.00–0.07)
Basophils Absolute: 0.1 10*3/uL (ref 0.0–0.1)
Basophils Relative: 1 %
Eosinophils Absolute: 0.2 10*3/uL (ref 0.0–0.5)
Eosinophils Relative: 3 %
HCT: 31.1 % — ABNORMAL LOW (ref 36.0–46.0)
Hemoglobin: 9.5 g/dL — ABNORMAL LOW (ref 12.0–15.0)
Immature Granulocytes: 1 %
Lymphocytes Relative: 11 %
Lymphs Abs: 0.7 10*3/uL (ref 0.7–4.0)
MCH: 30.9 pg (ref 26.0–34.0)
MCHC: 30.5 g/dL (ref 30.0–36.0)
MCV: 101.3 fL — ABNORMAL HIGH (ref 80.0–100.0)
Monocytes Absolute: 0.5 10*3/uL (ref 0.1–1.0)
Monocytes Relative: 8 %
Neutro Abs: 4.7 10*3/uL (ref 1.7–7.7)
Neutrophils Relative %: 76 %
Platelets: 218 10*3/uL (ref 150–400)
RBC: 3.07 MIL/uL — ABNORMAL LOW (ref 3.87–5.11)
RDW: 14.5 % (ref 11.5–15.5)
WBC: 6.2 10*3/uL (ref 4.0–10.5)
nRBC: 0 % (ref 0.0–0.2)

## 2019-04-10 LAB — GLUCOSE, CAPILLARY
Glucose-Capillary: 357 mg/dL — ABNORMAL HIGH (ref 70–99)
Glucose-Capillary: 291 mg/dL — ABNORMAL HIGH (ref 70–99)
Glucose-Capillary: 252 mg/dL — ABNORMAL HIGH (ref 70–99)

## 2019-04-10 LAB — URINALYSIS, ROUTINE W REFLEX MICROSCOPIC
Bacteria, UA: NONE SEEN
Bilirubin Urine: NEGATIVE
Glucose, UA: 150 mg/dL — AB
Ketones, ur: NEGATIVE mg/dL
Leukocytes,Ua: NEGATIVE
Nitrite: NEGATIVE
Protein, ur: NEGATIVE mg/dL
Specific Gravity, Urine: 1.01 (ref 1.005–1.030)
pH: 5 (ref 5.0–8.0)

## 2019-04-10 LAB — SARS CORONAVIRUS 2 (TAT 6-24 HRS): SARS Coronavirus 2: NEGATIVE

## 2019-04-10 LAB — LACTIC ACID, PLASMA: Lactic Acid, Venous: 1.4 mmol/L (ref 0.5–1.9)

## 2019-04-10 LAB — BRAIN NATRIURETIC PEPTIDE: B Natriuretic Peptide: 76.9 pg/mL (ref 0.0–100.0)

## 2019-04-10 MED ORDER — TOPIRAMATE 25 MG PO TABS
75.0000 mg | ORAL_TABLET | Freq: Every day | ORAL | Status: DC
  Administered 2019-04-10 – 2019-04-14 (×5): 75 mg via ORAL
  Filled 2019-04-10 (×5): qty 3

## 2019-04-10 MED ORDER — ONDANSETRON HCL 4 MG PO TABS: 4.0000 mg | ORAL_TABLET | Freq: Four times a day (QID) | ORAL | Status: DC | PRN

## 2019-04-10 MED ORDER — SUCRALFATE 1 G PO TABS
1.0000 g | ORAL_TABLET | Freq: Once | ORAL | Status: AC
  Administered 2019-04-10: 01:00:00 1 g via ORAL
  Filled 2019-04-10: qty 1

## 2019-04-10 MED ORDER — LEVOTHYROXINE SODIUM 100 MCG PO TABS
200.0000 ug | ORAL_TABLET | Freq: Every day | ORAL | Status: DC
  Administered 2019-04-11 – 2019-04-15 (×5): 200 ug via ORAL
  Filled 2019-04-10 (×5): qty 2

## 2019-04-10 MED ORDER — OXYCODONE HCL 5 MG PO TABS
5.0000 mg | ORAL_TABLET | Freq: Four times a day (QID) | ORAL | Status: DC | PRN
  Administered 2019-04-10 – 2019-04-15 (×8): 5 mg via ORAL
  Filled 2019-04-10 (×9): qty 1

## 2019-04-10 MED ORDER — SENNA 8.6 MG PO TABS
1.0000 | ORAL_TABLET | Freq: Every day | ORAL | Status: DC
  Administered 2019-04-10 – 2019-04-15 (×6): 8.6 mg via ORAL
  Filled 2019-04-10 (×6): qty 1

## 2019-04-10 MED ORDER — UMECLIDINIUM-VILANTEROL 62.5-25 MCG/INH IN AEPB
1.0000 | INHALATION_SPRAY | Freq: Every day | RESPIRATORY_TRACT | Status: DC
  Administered 2019-04-11 – 2019-04-15 (×5): 1 via RESPIRATORY_TRACT
  Filled 2019-04-10: qty 14

## 2019-04-10 MED ORDER — OXYCODONE-ACETAMINOPHEN 5-325 MG PO TABS
1.0000 | ORAL_TABLET | Freq: Four times a day (QID) | ORAL | Status: DC | PRN
  Administered 2019-04-10 – 2019-04-14 (×7): 1 via ORAL
  Filled 2019-04-10 (×8): qty 1

## 2019-04-10 MED ORDER — ATORVASTATIN CALCIUM 40 MG PO TABS
80.0000 mg | ORAL_TABLET | Freq: Every day | ORAL | Status: DC
  Administered 2019-04-10 – 2019-04-14 (×5): 80 mg via ORAL
  Filled 2019-04-10 (×5): qty 2

## 2019-04-10 MED ORDER — ENOXAPARIN SODIUM 40 MG/0.4ML ~~LOC~~ SOLN
40.0000 mg | SUBCUTANEOUS | Status: DC
  Administered 2019-04-10 – 2019-04-15 (×6): 40 mg via SUBCUTANEOUS
  Filled 2019-04-10 (×6): qty 0.4

## 2019-04-10 MED ORDER — GABAPENTIN 300 MG PO CAPS
600.0000 mg | ORAL_CAPSULE | Freq: Three times a day (TID) | ORAL | Status: DC
  Administered 2019-04-10 – 2019-04-15 (×16): 600 mg via ORAL
  Filled 2019-04-10 (×5): qty 2
  Filled 2019-04-10: qty 6
  Filled 2019-04-10 (×3): qty 2
  Filled 2019-04-10 (×2): qty 6
  Filled 2019-04-10 (×5): qty 2

## 2019-04-10 MED ORDER — FUROSEMIDE 10 MG/ML IJ SOLN
40.0000 mg | Freq: Two times a day (BID) | INTRAMUSCULAR | Status: DC
  Administered 2019-04-10 – 2019-04-14 (×9): 40 mg via INTRAVENOUS
  Filled 2019-04-10 (×9): qty 4

## 2019-04-10 MED ORDER — SILVER SULFADIAZINE 1 % EX CREA
1.0000 "application " | TOPICAL_CREAM | Freq: Two times a day (BID) | CUTANEOUS | Status: DC
  Administered 2019-04-10 – 2019-04-15 (×11): 1 via TOPICAL
  Filled 2019-04-10: qty 50

## 2019-04-10 MED ORDER — INSULIN ASPART 100 UNIT/ML ~~LOC~~ SOLN
0.0000 [IU] | Freq: Every day | SUBCUTANEOUS | Status: DC
  Administered 2019-04-10: 22:00:00 3 [IU] via SUBCUTANEOUS
  Administered 2019-04-11: 22:00:00 4 [IU] via SUBCUTANEOUS
  Administered 2019-04-12: 22:00:00 3 [IU] via SUBCUTANEOUS
  Administered 2019-04-13: 5 [IU] via SUBCUTANEOUS
  Administered 2019-04-14: 3 [IU] via SUBCUTANEOUS

## 2019-04-10 MED ORDER — POLYETHYLENE GLYCOL 3350 17 G PO PACK
17.0000 g | PACK | Freq: Two times a day (BID) | ORAL | Status: DC
  Administered 2019-04-10 – 2019-04-15 (×10): 17 g via ORAL
  Filled 2019-04-10 (×11): qty 1

## 2019-04-10 MED ORDER — ASPIRIN EC 81 MG PO TBEC
81.0000 mg | DELAYED_RELEASE_TABLET | Freq: Every day | ORAL | Status: DC
  Administered 2019-04-10 – 2019-04-15 (×6): 81 mg via ORAL
  Filled 2019-04-10 (×6): qty 1

## 2019-04-10 MED ORDER — GERHARDT'S BUTT CREAM: 1.0000 "application " | TOPICAL_CREAM | Freq: Four times a day (QID) | CUTANEOUS | Status: DC | PRN

## 2019-04-10 MED ORDER — ACETAMINOPHEN 325 MG PO TABS: 650.0000 mg | ORAL_TABLET | Freq: Four times a day (QID) | ORAL | Status: DC | PRN

## 2019-04-10 MED ORDER — POTASSIUM CHLORIDE 10 MEQ/100ML IV SOLN
10.0000 meq | Freq: Once | INTRAVENOUS | Status: AC
  Administered 2019-04-10: 10 meq via INTRAVENOUS
  Filled 2019-04-10: qty 100

## 2019-04-10 MED ORDER — CEFAZOLIN SODIUM-DEXTROSE 1-4 GM/50ML-% IV SOLN
1.0000 g | Freq: Once | INTRAVENOUS | Status: AC
  Administered 2019-04-10: 06:00:00 1 g via INTRAVENOUS
  Filled 2019-04-10: qty 50

## 2019-04-10 MED ORDER — ALPRAZOLAM 1 MG PO TABS
1.0000 mg | ORAL_TABLET | Freq: Three times a day (TID) | ORAL | Status: DC | PRN
  Administered 2019-04-10 – 2019-04-15 (×7): 1 mg via ORAL
  Filled 2019-04-10 (×7): qty 1

## 2019-04-10 MED ORDER — ACETAMINOPHEN 650 MG RE SUPP: 650.0000 mg | Freq: Four times a day (QID) | RECTAL | Status: DC | PRN

## 2019-04-10 MED ORDER — OXYCODONE-ACETAMINOPHEN 10-325 MG PO TABS: 1.0000 | ORAL_TABLET | Freq: Four times a day (QID) | ORAL | Status: DC | PRN

## 2019-04-10 MED ORDER — ALLOPURINOL 300 MG PO TABS
300.0000 mg | ORAL_TABLET | Freq: Every day | ORAL | Status: DC
  Administered 2019-04-10 – 2019-04-15 (×6): 300 mg via ORAL
  Filled 2019-04-10 (×6): qty 1

## 2019-04-10 MED ORDER — ONDANSETRON HCL 4 MG/2ML IJ SOLN: 4.0000 mg | Freq: Four times a day (QID) | INTRAMUSCULAR | Status: DC | PRN

## 2019-04-10 MED ORDER — FLEET ENEMA 7-19 GM/118ML RE ENEM
1.0000 | ENEMA | Freq: Once | RECTAL | Status: AC
  Administered 2019-04-10: 1 via RECTAL
  Filled 2019-04-10: qty 1

## 2019-04-10 MED ORDER — INSULIN DETEMIR 100 UNIT/ML ~~LOC~~ SOLN
40.0000 [IU] | Freq: Every day | SUBCUTANEOUS | Status: DC
  Administered 2019-04-10 – 2019-04-12 (×3): 40 [IU] via SUBCUTANEOUS
  Filled 2019-04-10 (×4): qty 0.4

## 2019-04-10 MED ORDER — ALBUTEROL SULFATE (2.5 MG/3ML) 0.083% IN NEBU: 2.5000 mg | INHALATION_SOLUTION | RESPIRATORY_TRACT | Status: DC | PRN

## 2019-04-10 MED ORDER — INSULIN ASPART 100 UNIT/ML ~~LOC~~ SOLN
0.0000 [IU] | Freq: Three times a day (TID) | SUBCUTANEOUS | Status: DC
  Administered 2019-04-10: 15 [IU] via SUBCUTANEOUS
  Administered 2019-04-10 – 2019-04-11 (×2): 8 [IU] via SUBCUTANEOUS
  Administered 2019-04-11 (×2): 5 [IU] via SUBCUTANEOUS
  Administered 2019-04-12: 11 [IU] via SUBCUTANEOUS
  Administered 2019-04-12: 5 [IU] via SUBCUTANEOUS
  Administered 2019-04-12 – 2019-04-13 (×2): 11 [IU] via SUBCUTANEOUS

## 2019-04-10 MED ORDER — PANTOPRAZOLE SODIUM 40 MG PO TBEC
40.0000 mg | DELAYED_RELEASE_TABLET | Freq: Two times a day (BID) | ORAL | Status: DC
  Administered 2019-04-10 – 2019-04-15 (×11): 40 mg via ORAL
  Filled 2019-04-10 (×11): qty 1

## 2019-04-10 MED ORDER — BUSPIRONE HCL 5 MG PO TABS
7.5000 mg | ORAL_TABLET | Freq: Two times a day (BID) | ORAL | Status: DC
  Administered 2019-04-10 – 2019-04-15 (×10): 7.5 mg via ORAL
  Filled 2019-04-10 (×11): qty 2

## 2019-04-10 MED ORDER — SUCRALFATE 1 G PO TABS
1.0000 g | ORAL_TABLET | Freq: Three times a day (TID) | ORAL | Status: DC
  Administered 2019-04-10 – 2019-04-15 (×21): 1 g via ORAL
  Filled 2019-04-10 (×21): qty 1

## 2019-04-10 NOTE — H&P (Signed)
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  She was recently admitted [facility] and treated for metabolic encephalopathy. She presents here with fluid overload. After discharge she was not able to walk as the fluid on her legs started building. She reports pain her legs and butt because of her decreased mobility. She states she uses a walker at baseline, now that's much harder for her. She denies any worsening SOB. She denies any chest pain.  She states she has not had a BM in probably 1 week.   Review of Systems:  The patient denies anorexia, fever, weight loss,, vision loss, decreased hearing, hoarseness, chest pain, syncope, dyspnea on exertion, peripheral edema, balance deficits, hemoptysis, abdominal pain, melena, hematochezia, severe indigestion/heartburn, hematuria, incontinence, genital sores, muscle weakness, suspicious skin lesions, transient blindness, difficulty walking, depression, unusual weight change, abnormal bleeding, enlarged lymph nodes, angioedema, and breast masses.  Past Medical History: Past Medical History:  Diagnosis Date  . Anxiety   . CAD (coronary artery disease)    a. s/p DES to LAD and angioplasty to D1 in 05/2017  . Cancer ([place])   . CHF (congestive heart failure) (HCC)    Diastolic  . Chronic back pain   . Chronic pain   . COPD (chronic obstructive pulmonary disease) ([place])   . Degenerative disk disease   . Diabetes mellitus without complication ([place])   . History of medication noncompliance 11/2017   "the patient frequently self-adjusts medications without notifying her physicians"  . Hypertension   . Hypothyroidism   . On home O2    2.5 L N/C prn  . Pedal edema    Past Surgical History:  Procedure Laterality Date  . BIOPSY  09/09/2018   Procedure: BIOPSY;  Surgeon: Daneil Dolin, MD;  [facility];  Service: Endoscopy;;  gastric esophageal hepatic flexure colon  . COLONOSCOPY WITH PROPOFOL N/A 09/09/2018   Procedure: COLONOSCOPY WITH PROPOFOL;  Surgeon: Daneil Dolin, MD;  [facility];  Service: Endoscopy;  Laterality: N/A;  . CORONARY STENT INTERVENTION N/A 05/22/2017   Procedure: CORONARY STENT INTERVENTION;  Surgeon: Martinique, Peter M, MD;  Location: [place] [facility];  Service: Cardiovascular;  Laterality: N/A;  . ESOPHAGOGASTRODUODENOSCOPY (EGD) WITH PROPOFOL N/A 09/09/2018   Procedure: ESOPHAGOGASTRODUODENOSCOPY (EGD) WITH PROPOFOL;  Surgeon: Daneil Dolin, MD;  [facility];  Service: Endoscopy;  Laterality: N/A;  . HERNIA REPAIR    . POLYPECTOMY  09/09/2018   Procedure: POLYPECTOMY;  Surgeon: Daneil Dolin, MD;  [facility];  Service: Endoscopy;;  colon  . RIGHT/LEFT HEART CATH AND CORONARY ANGIOGRAPHY N/A 05/19/2017   Procedure: RIGHT/LEFT HEART CATH AND CORONARY ANGIOGRAPHY;  Surgeon: Leonie Man, MD;  Location: [place] [facility];  Service: Cardiovascular;  Laterality: N/A;  . TUBAL LIGATION    . VIDEO BRONCHOSCOPY WITH ENDOBRONCHIAL ULTRASOUND Left 09/14/2018   Procedure: VIDEO BRONCHOSCOPY WITH ENDOBRONCHIAL ULTRASOUND with biopsies;  Surgeon: Margaretha Seeds, MD;  Location: [place];  Service: Thoracic;  Laterality: Left;    Medications: Prior to Admission medications   Medication Sig Start Date End Date Taking? Authorizing Provider  albuterol (PROAIR HFA) 108 (90 Base) MCG/ACT inhaler Inhale 2 puffs into the lungs every 4 (four) hours as needed for wheezing or shortness of breath. For  shortness of breath 09/22/18  Yes Eugenie Filler, MD  albuterol (PROVENTIL) (2.5 MG/3ML) 0.083% nebulizer solution Take 2.5 mg by nebulization 2 (two) times daily as needed for wheezing or shortness of breath.    Yes [provider]  allopurinol (ZYLOPRIM) 300 MG tablet Take 300 mg by mouth daily.   Yes  [provider]  ALPRAZolam Duanne Moron) 1 MG tablet Take 1 mg by mouth 3 (three) times daily as needed for anxiety or sleep.  01/05/17  Yes [provider]  aspirin EC 81 MG tablet Take 81 mg by mouth daily.   Yes [provider]  atorvastatin (LIPITOR) 80 MG tablet Take 1 tablet (80 mg total) by mouth daily at 6 PM. 05/26/17  Yes [name], MD  busPIRone (BUSPAR) 7.5 MG tablet Take 7.5 mg by mouth 2 (two) times daily. 03/10/19  Yes [provider]  cholecalciferol (VITAMIN D3) 25 MCG (1000 UT) tablet Take 5,000 Units by mouth daily.   Yes [provider]  diphenhydrAMINE-zinc acetate (BENADRYL) cream Apply 1 application topically 3 (three) times daily as needed for itching. 03/09/19  Yes Truitt Merle, MD  diphenoxylate-atropine (LOMOTIL) 2.5-0.025 MG tablet Take 1 tablet by mouth 4 (four) times daily as needed for diarrhea or loose stools.  01/03/19  Yes [provider]  furosemide (LASIX) 40 MG tablet Take 60 mg (1 1/2 tabs) in the morning and 40 mg (1 tab) in the afternoon Patient taking differently: Take 40 mg by mouth daily.  10/30/18  Yes Danford, Suann Larry, MD  gabapentin (NEURONTIN) 600 MG tablet Take 600 mg by mouth 3 (three) times daily.    Yes [provider]  hydrocortisone (ANUSOL-HC) 2.5 % rectal cream Place 1 application rectally 4 (four) times daily as needed for hemorrhoids.  01/03/19  Yes [provider]  Hydrocortisone (GERHARDT'S BUTT CREAM) CREA Apply 1 application topically 4 (four) times daily as needed for irritation. 12/16/18  Yes Truitt Merle, MD  insulin aspart (NOVOLOG) 100 UNIT/ML injection Inject 1-18 Units into the skin 3 (three) times daily with meals. 04/09/18  Yes Johnson, Clanford L, MD  insulin detemir (LEVEMIR) 100 UNIT/ML injection Inject 0.4 mLs (40 Units total) into the skin at bedtime. 11/26/17  Yes Tat, Shanon Brow, MD  insulin lispro (HUMALOG) 100 UNIT/ML injection Inject 0-20 Units into the skin 3 (three)  times daily with meals.  01/03/19  Yes [provider]  levothyroxine (SYNTHROID, LEVOTHROID) 200 MCG tablet Take 1 tablet by mouth daily before breakfast.  06/24/18  Yes [provider]  oxyCODONE (OXY IR/ROXICODONE) 5 MG immediate release tablet Take 1 tablet (5 mg total) by mouth every 12 (twelve) hours as needed for severe pain. 03/18/19  Yes Kyung Rudd, MD  OXYGEN Inhale 2 L into the lungs continuous.   Yes [provider]  pantoprazole (PROTONIX) 40 MG tablet Take 1 tablet (40 mg total) by mouth 2 (two) times daily. 10/07/18  Yes Truitt Merle, MD  potassium chloride SA (KLOR-CON) 20 MEQ tablet Take 1 tablet (20 mEq total) by mouth 2 (two) times daily. 03/09/19  Yes Truitt Merle, MD  promethazine (PHENERGAN) 12.5 MG tablet Take 1 tablet (12.5 mg total) by mouth every 6 (six) hours as needed for nausea or vomiting. 02/22/19  Yes Alla Feeling, NP  silver sulfADIAZINE (SILVADENE) 1 % cream Apply 1 application topically 2 (two) times daily.  01/03/19  Yes [provider]  sucralfate (CARAFATE) 1 g tablet Take 1 tablet (1 g total) by mouth  4 (four) times daily -  with meals and at bedtime. 5 min before meals for radiation induced esophagitis 12/03/18  Yes Tyler Pita, MD  topiramate (TOPAMAX) 25 MG tablet Take 75 mg by mouth at bedtime.  03/18/18  Yes [provider]  acetaminophen-codeine (TYLENOL #3) 300-30 MG tablet Take 1 tablet by mouth every 6 (six) hours as needed for moderate pain. 03/07/19   Hayden Pedro, PA-C  nitroGLYCERIN (NITROSTAT) 0.4 MG SL tablet Place 0.4 mg under the tongue every 5 (five) minutes as needed for chest pain.  03/26/18   [provider]  nystatin (MYCOSTATIN/NYSTOP) powder Apply topically 2 (two) times daily. 02/22/19   Alla Feeling, NP  Omega-3 Fatty Acids (FISH OIL PO) Take 1 capsule by mouth daily.     [provider]  ondansetron (ZOFRAN) 8 MG tablet Take 1 tablet (8 mg total) by mouth every 8  (eight) hours as needed for nausea or vomiting. 12/15/18   Truitt Merle, MD  ondansetron (ZOFRAN-ODT) 8 MG disintegrating tablet TAKE 1 TABLET BY MOUTH EVERY 8 HOURS AS NEEDED FOR NAUSEA OR VOMITING. 02/23/19   Alla Feeling, NP  oxyCODONE-acetaminophen (PERCOCET) 10-325 MG tablet Take 1 tablet by mouth every 6 (six) hours as needed for pain.    [provider]  prochlorperazine (COMPAZINE) 10 MG tablet Take 1 tablet (10 mg total) by mouth every 6 (six) hours as needed for nausea or vomiting. 11/24/18   Truitt Merle, MD  SURE COMFORT INS SYR 1CC/28G 28G X 1/2" 1 ML MISC USE TO INJECT INSULIN UP TO 4 TIMES DAILY. 03/06/17   Cassandria Anger, MD  tiZANidine (ZANAFLEX) 4 MG tablet Take 1 tablet (4 mg total) by mouth every 8 (eight) hours as needed for muscle spasms. 07/08/18   Kathie Dike, MD  umeclidinium-vilanterol (ANORO ELLIPTA) 62.5-25 MCG/INH AEPB Inhale 1 puff into the lungs daily. 05/26/17   [name], MD    Allergies:   Allergies  Allergen Reactions  . Dilaudid [Hydromorphone Hcl] Itching  . Midodrine Hcl Swelling    After one dose had anaphylactic reaction, had to call EMS.  Marland Kitchen Actifed Cold-Allergy [Chlorpheniramine-Phenylephrine]     "I was sick and red and it didn't agree with me at all"  . Doxycycline Nausea And Vomiting  . Other Cough    Pt states she is allergic to ragweed and that she starts coughing and sneezing like crazy  . Penicillins Hives and Itching    Tolerates Rocephin Has patient had a PCN reaction causing immediate rash, facial/tongue/throat swelling, SOB or lightheadedness with hypotension: Yes Has patient had a PCN reaction causing severe rash involving mucus membranes or skin necrosis: No Has patient had a PCN reaction that required hospitalization Yes Has patient had a PCN reaction occurring within the last 10 years: No If all of the above answers are "NO", then may proceed with Cephalosporin use.   . Reglan [Metoclopramide] Itching  . Valium  [Diazepam] Itching  . Vistaril [Hydroxyzine Hcl] Itching    Social History:  reports that she quit smoking about 4 years ago. Her smoking use included cigarettes. She has a 67.50 pack-year smoking history. She has never used smokeless tobacco. She reports that she does not drink alcohol or use drugs.  Family History: Family History  Problem Relation Age of Onset  . Stroke Mother        deceased 73   . Heart attack Father        deceased 12   .  Diabetes Brother   . Cerebral palsy Brother   . Pneumonia Brother   . Diabetes Other   . Heart attack Other   . Colon cancer Neg Hx     Physical Exam: Vitals:   04/10/19 0000 04/10/19 0126 04/10/19 0215 04/10/19 0430  BP: (!) 173/82 (!) 166/60 (!) 171/74 (!) 178/59  Pulse: 83 85 91   Resp: 20 16 17 17   Temp:  98.7 F (37.1 C)    TempSrc:  Oral    SpO2: 95% 98% 100%     General:  Alert and oriented times three, morbidly obese, no acute distress Eyes: PERRLA, pink conjunctiva, no scleral icterus ENT: Moist oral mucosa, neck supple, no thyromegaly Lungs: clear to ascultation, no wheeze, no crackles, no use of accessory muscles Cardiovascular: regular rate and rhythm, no regurgitation, no gallops, no murmurs. No carotid bruits, no JVD Abdomen: soft, positive BS, non-tender, non-distended, no organomegaly, not an acute abdomen. Large panus, no evidence of infection under panus GU: not examined Neuro: CN II - XII grossly intact, sensation intact Musculoskeletal: strength 5/5 all extremities, no clubbing, >3+ B/L pitting edema and cellulitis, evidence of chronic fluid retension Skin: no rash, no subcutaneous crepitation, no decubitus Psych: appropriate patient   Labs on Admission:  Recent Labs    04/10/19 0018  NA 136  K 3.9  CL 101  CO2 24  GLUCOSE 261*  BUN 50*  CREATININE 2.22*  CALCIUM 9.2   Recent Labs    04/10/19 0018  AST 19  ALT 12  ALKPHOS 87  BILITOT 0.5  PROT 6.8  ALBUMIN 3.6   No results for input(s):  LIPASE, AMYLASE in the last 72 hours. Recent Labs    04/10/19 0018  WBC 6.2  NEUTROABS 4.7  HGB 9.5*  HCT 31.1*  MCV 101.3*  PLT 218   No results for input(s): CKTOTAL, CKMB, CKMBINDEX, TROPONINI in the last 72 hours. Invalid input(s): POCBNP No results for input(s): DDIMER in the last 72 hours. No results for input(s): HGBA1C in the last 72 hours. No results for input(s): CHOL, HDL, LDLCALC, TRIG, CHOLHDL, LDLDIRECT in the last 72 hours. No results for input(s): TSH, T4TOTAL, T3FREE, THYROIDAB in the last 72 hours.  Invalid input(s): FREET3 No results for input(s): VITAMINB12, FOLATE, FERRITIN, TIBC, IRON, RETICCTPCT in the last 72 hours.  Micro Results: Recent Results (from the past 240 hour(s))  Blood culture (routine x 2)     Status: None   Collection Time: 03/31/19  4:07 PM   Specimen: Right Antecubital; Blood  Result Value Ref Range Status   Specimen Description RIGHT ANTECUBITAL  Final   Special Requests   Final    BOTTLES DRAWN AEROBIC AND ANAEROBIC Blood Culture adequate volume   Culture   Final    NO GROWTH 5 DAYS Performed at [facility], [address] [place], [place] [address]    Report Status 04/05/2019 FINAL  Final  Blood culture (routine x 2)     Status: None   Collection Time: 03/31/19  4:25 PM   Specimen: BLOOD LEFT HAND  Result Value Ref Range Status   Specimen Description BLOOD LEFT HAND  Final   Special Requests   Final    BOTTLES DRAWN AEROBIC AND ANAEROBIC Blood Culture adequate volume   Culture   Final    NO GROWTH 5 DAYS Performed at [facility], [address] [place], [place] [address]    Report Status 04/05/2019 FINAL  Final  SARS CORONAVIRUS 2 (TAT 6-24  HRS) Nasopharyngeal Nasopharyngeal Swab     Status: None   Collection Time: 03/31/19  9:08 PM   Specimen: Nasopharyngeal Swab  Result Value Ref Range Status   SARS Coronavirus 2 NEGATIVE NEGATIVE Final    Comment: (NOTE) SARS-CoV-2 target nucleic acids are NOT DETECTED. The  SARS-CoV-2 RNA is generally detectable in upper and lower respiratory specimens during the acute phase of infection. Negative results do not preclude SARS-CoV-2 infection, do not rule out co-infections with other pathogens, and should not be used as the sole basis for treatment or other patient management decisions. Negative results must be combined with clinical observations, patient history, and epidemiological information. The expected result is Negative. Fact Sheet for Patients: SugarRoll.be Fact Sheet for Healthcare Providers: https://www.woods-mathews.com/ This test is not yet approved or cleared by the [place] FDA and  has been authorized for detection and/or diagnosis of SARS-CoV-2 by FDA under an Emergency Use Authorization (EUA). This EUA will remain  in effect (meaning this test can be used) for the duration of the COVID-19 declaration under Section 56 4(b)(1) of the Act, 21 U.S.C. section 360bbb-3(b)(1), unless the authorization is terminated or revoked sooner. Performed at [facility], [place] [address], [place], [place] [address]      Radiological Exams on Admission: Dg Chest Portable 1 View  Result Date: 04/10/2019 CLINICAL DATA:  CHF EXAM: PORTABLE CHEST 1 VIEW COMPARISON:  Radiograph 03/31/2019 FINDINGS: Increasing hazy interstitial opacities in the lung bases with mild cardiomegaly and indistinct pulmonary vascularity with few septal lines. Some atelectatic opacity is noted in the right lung base as well. No pneumothorax. No visible effusion. No acute osseous or soft tissue abnormality. IMPRESSION: Features suggest mild developing interstitial edema. Mild cardiomegaly. Electronically Signed   By: Lovena Le M.D.   On: 04/10/2019 00:11     Result status: Final result    ECHOCARDIOGRAM REPORT       Patient Name:   Stephanie Combs Date of Exam: 07/05/2018 Medical Rec #:  [id number]       Height:       66.0  in Accession #:    [id number]      Weight:       269.0 lb Date of Birth:  [birth year]       BSA:          2.27 m Patient Age:    35 years        BP:           145/68 mmHg Patient Gender: F               HR:           70 bpm. Exam Location:  [facility]    Procedure: 2D Echo  Indications:    Chest Pain 786.50 / R07.9   History:        Patient has prior history of Echocardiogram examinations, most                 recent 10/24/2017. COPD; Signs/Symptoms: Syncope; Risk Factors:                 Diabetes, Hypertension and Former Smoker. Morbid Obesity, opioid                 dependence, Respiratory failure, Bradycardia.   Sonographer:    Leavy Cella RDCS (AE) Referring Phys: [place]    1. The left ventricle has normal systolic function with an ejection fraction  of 60-65%. The cavity size was normal. There is moderately increased left ventricular wall thickness. Left ventricular diastolic Doppler parameters are consistent with impaired  relaxation. No evidence of left ventricular regional wall motion abnormalities.  2. The right ventricle has normal systolic function. The cavity was normal. There is no increase in right ventricular wall thickness. Right ventricular systolic pressure is moderately elevated with an estimated pressure of 46.8 mmHg.  3. The mitral valve is normal in structure. There is mild mitral annular calcification present.  4. The tricuspid valve is normal in structure.  5. The aortic valve was not well visualized.  6. No evidence of left ventricular regional wall motion abnormalities.  FINDINGS  Left Ventricle: The left ventricle has normal systolic function, with an ejection fraction of 60-65%. The cavity size was normal. There is moderately increased left ventricular wall thickness. Left ventricular diastolic Doppler parameters are consistent  with impaired relaxation No evidence of left ventricular regional wall motion  abnormalities.. Right Ventricle: The right ventricle has normal systolic function. The cavity was normal. There is no increase in right ventricular wall thickness. Right ventricular systolic pressure is moderately elevated with an estimated pressure of 46.8 mmHg. Left Atrium: left atrial size was normal in size Right Atrium: right atrial size was normal in size. Right atrial pressure is estimated at 3 mmHg. Interatrial Septum: No atrial level shunt detected by color flow Doppler. Pericardium: There is no evidence of pericardial effusion. Mitral Valve: The mitral valve is normal in structure. There is mild mitral annular calcification present. Mitral valve regurgitation is mild by color flow Doppler. Tricuspid Valve: The tricuspid valve is normal in structure. Tricuspid valve regurgitation is mild by color flow Doppler. Aortic Valve: The aortic valve was not well visualized Aortic valve regurgitation was not visualized by color flow Doppler. Pulmonic Valve: The pulmonic valve was not well visualized. Pulmonic valve regurgitation was not assessed by color flow Doppler. Venous: The inferior vena cava is normal in size with greater than 50% respiratory variability.       Assessment/Plan Present on Admission: . Fluid overload -Admit to med telemetry -Lasix scheduled every 12 -Strict I's and O's, daily weight -Oxygen to keep sats greater than 88% -Purwick ordered  . Acute renal failure superimposed on stage 3 chronic kidney disease (HCC) -Monitor closely as patient is on Lasix  B/L cellulitis -vanco and rocephin ordered -Monitor  Constipation -enema/suppository ordered -Ongoing bowel regimen ordered  . Small cell lung cancer, left upper lobe ([place]) . Brain metastasis ([place]) -Per oncology  . CAD (coronary artery disease)/CHF mild -Stable, home meds resumed  . COPD (chronic obstructive pulmonary disease) ([place]) . Chronic respiratory failure with hypoxia (HCC) -Oxygen,  nebulizers -Respiratory to evaluate and treat  . Hypothyroidism -Table, home meds resumed  . Morbid obesity ([place]) -aware  . Chronic pain -    Casimiro Lienhard 04/10/2019, 5:15 AM

## 2019-04-10 NOTE — Progress Notes (Signed)
Received patient from ED, VS obtained, telemetry applied, skin assessment performed with 2nd RN, oriented to unit, call light placed in reach

## 2019-04-10 NOTE — ED Notes (Signed)
ED TO INPATIENT HANDOFF REPORT  ED Nurse Name and Phone #: jon wled   S Name/Age/Gender Stephanie Combs 68 y.o. female Room/Bed: WA24/WA24  Code Status   Code Status: Prior  Home/SNF/Other Home  Patient oriented to: self, place, time and situation Is this baseline? Yes   Triage Complete: Triage complete  Chief Complaint Fluid Retention   Triage Note Pt comes to ed via ems, pt is not able to take care of herself, pt c/o of fluid build up on extremities and abdomen. Pt was at [facility] is very unhappy she was discharged.  Pt is alert, " states can not walk or get to the bathroom.  Pt is a diabetic and takes lasix.   V/s 120/62 sp02 100 4 liters, hr 62, rr22.     Allergies Allergies  Allergen Reactions  . Dilaudid [Hydromorphone Hcl] Itching  . Midodrine Hcl Swelling    After one dose had anaphylactic reaction, had to call EMS.  Marland Kitchen Actifed Cold-Allergy [Chlorpheniramine-Phenylephrine]     "I was sick and red and it didn't agree with me at all"  . Doxycycline Nausea And Vomiting  . Other Cough    Pt states she is allergic to ragweed and that she starts coughing and sneezing like crazy  . Penicillins Hives and Itching    Tolerates Rocephin Has patient had a PCN reaction causing immediate rash, facial/tongue/throat swelling, SOB or lightheadedness with hypotension: Yes Has patient had a PCN reaction causing severe rash involving mucus membranes or skin necrosis: No Has patient had a PCN reaction that required hospitalization Yes Has patient had a PCN reaction occurring within the last 10 years: No If all of the above answers are "NO", then may proceed with Cephalosporin use.   . Reglan [Metoclopramide] Itching  . Valium [Diazepam] Itching  . Vistaril [Hydroxyzine Hcl] Itching    Level of Care/Admitting Diagnosis ED Disposition    ED Disposition Condition Comment   Admit  Hospital Area: [facility] [[id number]]  Level of Care: Telemetry [5]  Admit to  tele based on following criteria: Other see comments  Comments: fluid overload, CHF  Covid Evaluation: Confirmed COVID Negative  Diagnosis: Fluid overload [132440]  Admitting Physician: [place], [place]  Attending Physician: Quintella Baton [[id number]]  Estimated length of stay: past midnight tomorrow  Certification:: I certify this patient will need inpatient services for at least 2 midnights  PT Class (Do Not Modify): Inpatient [101]  PT Acc Code (Do Not Modify): Private [1]       B Medical/Surgery History Past Medical History:  Diagnosis Date  . Anxiety   . CAD (coronary artery disease)    a. s/p DES to LAD and angioplasty to D1 in 05/2017  . Cancer ([place])   . CHF (congestive heart failure) (HCC)    Diastolic  . Chronic back pain   . Chronic pain   . COPD (chronic obstructive pulmonary disease) ([place])   . Degenerative disk disease   . Diabetes mellitus without complication ([place])   . History of medication noncompliance 11/2017   "the patient frequently self-adjusts medications without notifying her physicians"  . Hypertension   . Hypothyroidism   . On home O2    2.5 L N/C prn  . Pedal edema    Past Surgical History:  Procedure Laterality Date  . BIOPSY  09/09/2018   Procedure: BIOPSY;  Surgeon: Daneil Dolin, MD;  [facility];  Service: Endoscopy;;  gastric esophageal hepatic flexure colon  .  COLONOSCOPY WITH PROPOFOL N/A 09/09/2018   Procedure: COLONOSCOPY WITH PROPOFOL;  Surgeon: Daneil Dolin, MD;  [facility];  Service: Endoscopy;  Laterality: N/A;  . CORONARY STENT INTERVENTION N/A 05/22/2017   Procedure: CORONARY STENT INTERVENTION;  Surgeon: Martinique, Peter M, MD;  Location: [facility];  Service: Cardiovascular;  Laterality: N/A;  . ESOPHAGOGASTRODUODENOSCOPY (EGD) WITH PROPOFOL N/A 09/09/2018   Procedure: ESOPHAGOGASTRODUODENOSCOPY (EGD) WITH PROPOFOL;  Surgeon: Daneil Dolin, MD;  [facility];  Service: Endoscopy;   Laterality: N/A;  . HERNIA REPAIR    . POLYPECTOMY  09/09/2018   Procedure: POLYPECTOMY;  Surgeon: Daneil Dolin, MD;  [facility];  Service: Endoscopy;;  colon  . RIGHT/LEFT HEART CATH AND CORONARY ANGIOGRAPHY N/A 05/19/2017   Procedure: RIGHT/LEFT HEART CATH AND CORONARY ANGIOGRAPHY;  Surgeon: Leonie Man, MD;  Location: [facility];  Service: Cardiovascular;  Laterality: N/A;  . TUBAL LIGATION    . VIDEO BRONCHOSCOPY WITH ENDOBRONCHIAL ULTRASOUND Left 09/14/2018   Procedure: VIDEO BRONCHOSCOPY WITH ENDOBRONCHIAL ULTRASOUND with biopsies;  Surgeon: Margaretha Seeds, MD;  Location: [facility];  Service: Thoracic;  Laterality: Left;     A IV Location/Drains/Wounds Patient Lines/Drains/Airways Status   Active Line/Drains/Airways    Name:   Placement date:   Placement time:   Site:   Days:   Peripheral IV 03/31/19 Left Other (Comment)   03/31/19    1642    Other (Comment)   10   Peripheral IV 04/01/19 Right Forearm   04/01/19    1415    Forearm   9   Peripheral IV 04/10/19 Left;Anterior Forearm   04/10/19    0114    Forearm   less than 1   External Urinary Catheter   04/09/19    2236    -   1   Wound / Incision (Open or Dehisced) 10/13/18 Non-pressure wound Abdomen Anterior;Lower Open Wound with yellow bed under abdominal fold   10/13/18    2319    Abdomen   179   Wound / Incision (Open or Dehisced) 10/23/18 Incision - Open Buttocks Left;Medial abscess lanced 10/23/2018   10/23/18    1100    Buttocks   169          Intake/Output Last 24 hours  Intake/Output Summary (Last 24 hours) at 04/10/2019 0612 Last data filed at 04/10/2019 0535 Gross per 24 hour  Intake -  Output 2000 ml  Net -2000 ml    Labs/Imaging Results for orders placed or performed during the hospital encounter of 04/09/19 (from the past 48 hour(s))  CBC with Differential     Status: Abnormal   Collection Time: 04/10/19 12:18 AM  Result Value Ref Range   WBC 6.2 4.0 - 10.5 K/uL   RBC 3.07 (L)  3.87 - 5.11 MIL/uL   Hemoglobin 9.5 (L) 12.0 - 15.0 g/dL   HCT 31.1 (L) 36.0 - 46.0 %   MCV 101.3 (H) 80.0 - 100.0 fL   MCH 30.9 26.0 - 34.0 pg   MCHC 30.5 30.0 - 36.0 g/dL   RDW 14.5 11.5 - 15.5 %   Platelets 218 150 - 400 K/uL   nRBC 0.0 0.0 - 0.2 %   Neutrophils Relative % 76 %   Neutro Abs 4.7 1.7 - 7.7 K/uL   Lymphocytes Relative 11 %   Lymphs Abs 0.7 0.7 - 4.0 K/uL   Monocytes Relative 8 %   Monocytes Absolute 0.5 0.1 -  1.0 K/uL   Eosinophils Relative 3 %   Eosinophils Absolute 0.2 0.0 - 0.5 K/uL   Basophils Relative 1 %   Basophils Absolute 0.1 0.0 - 0.1 K/uL   Immature Granulocytes 1 %   Abs Immature Granulocytes 0.05 0.00 - 0.07 K/uL    Comment: Performed at [facility], [place] [address] [place], [place] [address]  Lactic acid, plasma     Status: None   Collection Time: 04/10/19 12:18 AM  Result Value Ref Range   Lactic Acid, Venous 1.4 0.5 - 1.9 mmol/L    Comment: Performed at [facility], [place] [address] [place], [place] [address]  Brain natriuretic peptide     Status: None   Collection Time: 04/10/19 12:18 AM  Result Value Ref Range   B Natriuretic Peptide 76.9 0.0 - 100.0 pg/mL    Comment: Performed at [facility], [place] [address] [place], [place] [address]  Comprehensive metabolic panel     Status: Abnormal   Collection Time: 04/10/19 12:18 AM  Result Value Ref Range   Sodium 136 135 - 145 mmol/L   Potassium 3.9 3.5 - 5.1 mmol/L   Chloride 101 98 - 111 mmol/L   CO2 24 22 - 32 mmol/L   Glucose, Bld 261 (H) 70 - 99 mg/dL   BUN 50 (H) 8 - 23 mg/dL   Creatinine, Ser 2.22 (H) 0.44 - 1.00 mg/dL   Calcium 9.2 8.9 - 10.3 mg/dL   Total Protein 6.8 6.5 - 8.1 g/dL   Albumin 3.6 3.5 - 5.0 g/dL   AST 19 15 - 41 U/L   ALT 12 0 - 44 U/L   Alkaline Phosphatase 87 38 - 126 U/L   Total Bilirubin 0.5 0.3 - 1.2 mg/dL   GFR calc non Af Amer 22 (L) >60 mL/min   GFR calc Af Amer 25 (L) >60 mL/min   Anion gap 11 5 -  15    Comment: Performed at [facility], [place] [address], [place], [place] [address]  Urinalysis, Routine w reflex microscopic     Status: Abnormal   Collection Time: 04/10/19 12:18 AM  Result Value Ref Range   Color, Urine STRAW (A) YELLOW   APPearance CLEAR CLEAR   Specific Gravity, Urine 1.010 1.005 - 1.030   pH 5.0 5.0 - 8.0   Glucose, UA 150 (A) NEGATIVE mg/dL   Hgb urine dipstick LARGE (A) NEGATIVE   Bilirubin Urine NEGATIVE NEGATIVE   Ketones, ur NEGATIVE NEGATIVE mg/dL   Protein, ur NEGATIVE NEGATIVE mg/dL   Nitrite NEGATIVE NEGATIVE   Leukocytes,Ua NEGATIVE NEGATIVE   RBC / HPF 6-10 0 - 5 RBC/hpf   WBC, UA 0-5 0 - 5 WBC/hpf   Bacteria, UA NONE SEEN NONE SEEN    Comment: Performed at [facility], [place] [address] [place],  [address]   Dg Chest Portable 1 View  Result Date: 04/10/2019 CLINICAL DATA:  CHF EXAM: PORTABLE CHEST 1 VIEW COMPARISON:  Radiograph 03/31/2019 FINDINGS: Increasing hazy interstitial opacities in the lung bases with mild cardiomegaly and indistinct pulmonary vascularity with few septal lines. Some atelectatic opacity is noted in the right lung base as well. No pneumothorax. No visible effusion. No acute osseous or soft tissue abnormality. IMPRESSION: Features suggest mild developing interstitial edema. Mild cardiomegaly. Electronically Signed   By: Lovena Le M.D.   On: 04/10/2019 00:11    Pending Labs Unresulted Labs (From admission, onward)    Start  Ordered   04/10/19 0214  SARS CORONAVIRUS 2 (TAT 6-24 HRS) Nasopharyngeal Nasopharyngeal Swab  (Asymptomatic/Tier 3)  Once,   STAT    Question Answer Comment  Is this test for diagnosis or screening Screening   Symptomatic for COVID-19 as defined by CDC No   Hospitalized for COVID-19 No   Admitted to ICU for COVID-19 No   Previously tested for COVID-19 Yes   Resident in a congregate (group) care setting No   Employed in healthcare setting No   Pregnant  No      04/10/19 0214          Vitals/Pain Today's Vitals   04/10/19 0215 04/10/19 0430 04/10/19 0515 04/10/19 0524  BP: (!) 171/74 (!) 178/59 (!) 176/67 (!) 176/67  Pulse: 91   91  Resp: 17 17 16 16   Temp:    98.6 F (37 C)  TempSrc:    Oral  SpO2: 100%   95%  PainSc:    0-No pain    Isolation Precautions No active isolations  Medications Medications  furosemide (LASIX) injection 60 mg (60 mg Intravenous Given 04/10/19 0126)  albuterol (VENTOLIN HFA) 108 (90 Base) MCG/ACT inhaler 2 puff (2 puffs Inhalation Given 04/10/19 0030)  sucralfate (CARAFATE) tablet 1 g (1 g Oral Given 04/10/19 0125)  ceFAZolin (ANCEF) IVPB 1 g/50 mL premix (0 g Intravenous Stopped 04/10/19 0612)    Mobility non-ambulatory     Focused Assessments Fluid retention / I &O    R Recommendations: See Admitting Provider Note  Report given to:   Additional Notes:

## 2019-04-10 NOTE — Progress Notes (Signed)
PROGRESS NOTE    Stephanie Combs  CHE:[id number] DOB: 28-May-1950 DOA: 04/09/2019 PCP: [name], MD    Brief Narrative: It was admitted earlier this morning concern was fluid overload With your extremity edema. She is feeling better but she is concerned about her potassium being low and she was having lots of leg cramps  Assessment & Plan:   Principal Problem:   Fluid overload Active Problems:   COPD (chronic obstructive pulmonary disease) (HCC)   Morbid obesity ([place])   DM type 2 (diabetes mellitus, type 2) (HCC)   Hypothyroidism   Acute renal failure superimposed on stage 3 chronic kidney disease (HCC)   Chronic respiratory failure with hypoxia (HCC)   CAD (coronary artery disease)   Chronic pain   Opioid dependence ([place])   Small cell lung cancer, left upper lobe (HCC)   Brain metastasis (HCC)   Lung cancer ([place])     Procedures: ( Antimicrobials:    Subjective: Leg cramps no other complaints. Has no chest pain or shortness of breath had a good breakfast  Objective: Vitals:   04/10/19 0515 04/10/19 0524 04/10/19 0600 04/10/19 0759  BP: (!) 176/67 (!) 176/67 (!) 177/71 (!) 147/63  Pulse:  91  92  Resp: 16 16 18 20   Temp:  98.6 F (37 C)  98.1 F (36.7 C)  TempSrc:  Oral  Oral  SpO2:  95%  100%  Weight:    126.2 kg  Height:    5\' 6"  (1.676 m)    Intake/Output Summary (Last 24 hours) at 04/10/2019 1212 Last data filed at 04/10/2019 1128 Gross per 24 hour  Intake -  Output 2700 ml  Net -2700 ml   Filed Weights   04/10/19 0759  Weight: 126.2 kg   Diuresis noted Examination:  General exam: Appears calm and comfortable  S post chemotherapy Respiratory system: Clear to auscultation. Respiratory effort normal. Cardiovascular system: S1 & S2 heard, RRR. No JVD, murmurs, rubs, gallops or clicks. No pedal edema. Gastrointestinal system: Abdomen is nondistended, soft and nontender. No organomegaly or masses felt. Normal bowel sounds heard. Central nervous  system: Alert and oriented. No focal neurological deficits. Extremities: 3+ edema and cellulitis are noted. She notes that it is less than when she first came in Skin: Ecchymotic rash on both lower extremities Psychiatry: Judgement and insight appear normal. Mood & affect appropriate.     Data Reviewed: I have personally reviewed following labs and imaging studies Numbers have been reviewed by the admitting physician her hemoglobin is 9.5. Her potassium was 3.9 but she has been diuresed and I feel that she could use some potassium supplementation CBC: Recent Labs  Lab 04/10/19 0018  WBC 6.2  NEUTROABS 4.7  HGB 9.5*  HCT 31.1*  MCV 101.3*  PLT 536   Basic Metabolic Panel: Recent Labs  Lab 04/10/19 0018  NA 136  K 3.9  CL 101  CO2 24  GLUCOSE 261*  BUN 50*  CREATININE 2.22*  CALCIUM 9.2   GFR: Estimated Creatinine Clearance: 32.5 mL/min (A) (by C-G formula based on SCr of 2.22 mg/dL (H)). Liver Function Tests: Recent Labs  Lab 04/10/19 0018  AST 19  ALT 12  ALKPHOS 87  BILITOT 0.5  PROT 6.8  ALBUMIN 3.6   No results for input(s): LIPASE, AMYLASE in the last 168 hours. No results for input(s): AMMONIA in the last 168 hours. Coagulation Profile: No results for input(s): INR, PROTIME in the last 168 hours. Cardiac Enzymes: No results for input(s):  CKTOTAL, CKMB, CKMBINDEX, TROPONINI in the last 168 hours. BNP (last 3 results) No results for input(s): PROBNP in the last 8760 hours. HbA1C: No results for input(s): HGBA1C in the last 72 hours. CBG: Recent Labs  Lab 04/03/19 1624 04/10/19 1136  GLUCAP 226* 357*   Lipid Profile: No results for input(s): CHOL, HDL, LDLCALC, TRIG, CHOLHDL, LDLDIRECT in the last 72 hours. Thyroid Function Tests: No results for input(s): TSH, T4TOTAL, FREET4, T3FREE, THYROIDAB in the last 72 hours. Anemia Panel: No results for input(s): VITAMINB12, FOLATE, FERRITIN, TIBC, IRON, RETICCTPCT in the last 72 hours. Sepsis Labs:  Recent Labs  Lab 04/10/19 0018  LATICACIDVEN 1.4    Recent Results (from the past 240 hour(s))  Blood culture (routine x 2)     Status: None   Collection Time: 03/31/19  4:07 PM   Specimen: Right Antecubital; Blood  Result Value Ref Range Status   Specimen Description RIGHT ANTECUBITAL  Final   Special Requests   Final    BOTTLES DRAWN AEROBIC AND ANAEROBIC Blood Culture adequate volume   Culture   Final    NO GROWTH 5 DAYS Performed at [facility], [address] [place], [place] [address]    Report Status 04/05/2019 FINAL  Final  Blood culture (routine x 2)     Status: None   Collection Time: 03/31/19  4:25 PM   Specimen: BLOOD LEFT HAND  Result Value Ref Range Status   Specimen Description BLOOD LEFT HAND  Final   Special Requests   Final    BOTTLES DRAWN AEROBIC AND ANAEROBIC Blood Culture adequate volume   Culture   Final    NO GROWTH 5 DAYS Performed at [facility], [address], [place], [place] [address]    Report Status 04/05/2019 FINAL  Final  SARS CORONAVIRUS 2 (TAT 6-24 HRS) Nasopharyngeal Nasopharyngeal Swab     Status: None   Collection Time: 03/31/19  9:08 PM   Specimen: Nasopharyngeal Swab  Result Value Ref Range Status   SARS Coronavirus 2 NEGATIVE NEGATIVE Final    Comment: (NOTE) SARS-CoV-2 target nucleic acids are NOT DETECTED. The SARS-CoV-2 RNA is generally detectable in upper and lower respiratory specimens during the acute phase of infection. Negative results do not preclude SARS-CoV-2 infection, do not rule out co-infections with other pathogens, and should not be used as the sole basis for treatment or other patient management decisions. Negative results must be combined with clinical observations, patient history, and epidemiological information. The expected result is Negative. Fact Sheet for Patients: SugarRoll.be Fact Sheet for Healthcare Providers: https://www.woods-mathews.com/ This test is  not yet approved or cleared by the [place] FDA and  has been authorized for detection and/or diagnosis of SARS-CoV-2 by FDA under an Emergency Use Authorization (EUA). This EUA will remain  in effect (meaning this test can be used) for the duration of the COVID-19 declaration under Section 56 4(b)(1) of the Act, 21 U.S.C. section 360bbb-3(b)(1), unless the authorization is terminated or revoked sooner. Performed at [facility], [place] [address], [place], [place] [address]          Radiology Studies: Dg Chest Portable 1 View  Result Date: 04/10/2019 CLINICAL DATA:  CHF EXAM: PORTABLE CHEST 1 VIEW COMPARISON:  Radiograph 03/31/2019 FINDINGS: Increasing hazy interstitial opacities in the lung bases with mild cardiomegaly and indistinct pulmonary vascularity with few septal lines. Some atelectatic opacity is noted in the right lung base as well. No pneumothorax. No visible effusion. No acute osseous or soft  tissue abnormality. IMPRESSION: Features suggest mild developing interstitial edema. Mild cardiomegaly. Electronically Signed   By: Lovena Le M.D.   On: 04/10/2019 00:11        Scheduled Meds: . allopurinol  300 mg Oral Daily  . aspirin EC  81 mg Oral Daily  . atorvastatin  80 mg Oral q1800  . busPIRone  7.5 mg Oral BID  . enoxaparin (LOVENOX) injection  40 mg Subcutaneous Q24H  . furosemide  40 mg Intravenous Q12H  . gabapentin  600 mg Oral TID  . insulin aspart  0-15 Units Subcutaneous TID WC  . insulin aspart  0-5 Units Subcutaneous QHS  . insulin detemir  40 Units Subcutaneous QHS  . [START ON 04/11/2019] levothyroxine  200 mcg Oral Q0600  . pantoprazole  40 mg Oral BID  . polyethylene glycol  17 g Oral BID  . senna  1 tablet Oral Daily  . silver sulfADIAZINE  1 application Topical BID  . sodium phosphate  1 enema Rectal Once  . sucralfate  1 g Oral TID WC & HS  . topiramate  75 mg Oral QHS  . umeclidinium-vilanterol  1 puff Inhalation Daily    Continuous Infusions:   LOS: 0 days    Time spent: 20 minutes    Pearlean Brownie, MD [facility] Pager [phone]  If 7PM-7AM, please contact night-coverage [email or web address] Password [email or web address] 04/10/2019, 12:12 PM

## 2019-04-11 ENCOUNTER — Telehealth: Payer: Self-pay | Admitting: Radiation Oncology

## 2019-04-11 ENCOUNTER — Encounter (HOSPITAL_COMMUNITY): Payer: Self-pay | Admitting: *Deleted

## 2019-04-11 ENCOUNTER — Inpatient Hospital Stay (HOSPITAL_COMMUNITY): Payer: Medicare Other

## 2019-04-11 DIAGNOSIS — I5031 Acute diastolic (congestive) heart failure: Secondary | ICD-10-CM

## 2019-04-11 LAB — GLUCOSE, CAPILLARY
Glucose-Capillary: 286 mg/dL — ABNORMAL HIGH (ref 70–99)
Glucose-Capillary: 237 mg/dL — ABNORMAL HIGH (ref 70–99)
Glucose-Capillary: 350 mg/dL — ABNORMAL HIGH (ref 70–99)

## 2019-04-11 LAB — CREATININE, SERUM
Creatinine, Ser: 1.77 mg/dL — ABNORMAL HIGH (ref 0.44–1.00)
GFR calc Af Amer: 33 mL/min — ABNORMAL LOW (ref >60)
GFR calc non Af Amer: 29 mL/min — ABNORMAL LOW (ref >60)

## 2019-04-11 LAB — CBC
HCT: 31.8 % — ABNORMAL LOW (ref 36.0–46.0)
Hemoglobin: 9.8 g/dL — ABNORMAL LOW (ref 12.0–15.0)
MCH: 31 pg (ref 26.0–34.0)
MCHC: 30.8 g/dL (ref 30.0–36.0)
MCV: 100.6 fL — ABNORMAL HIGH (ref 80.0–100.0)
Platelets: 202 10*3/uL (ref 150–400)
RBC: 3.16 MIL/uL — ABNORMAL LOW (ref 3.87–5.11)
RDW: 14.2 % (ref 11.5–15.5)
WBC: 5.6 10*3/uL (ref 4.0–10.5)
nRBC: 0 % (ref 0.0–0.2)

## 2019-04-11 LAB — ECHOCARDIOGRAM COMPLETE
Height: 66 in
Weight: 4423.31 oz

## 2019-04-11 LAB — HEMOGLOBIN A1C
Hgb A1c MFr Bld: 8.4 % — ABNORMAL HIGH (ref 4.8–5.6)
Mean Plasma Glucose: 194.38 mg/dL

## 2019-04-11 MED ORDER — BISACODYL 10 MG RE SUPP
10.0000 mg | Freq: Once | RECTAL | Status: AC
  Administered 2019-04-11: 14:00:00 10 mg via RECTAL
  Filled 2019-04-11: qty 1

## 2019-04-11 MED ORDER — CEFAZOLIN SODIUM-DEXTROSE 1-4 GM/50ML-% IV SOLN
1.0000 g | Freq: Three times a day (TID) | INTRAVENOUS | Status: DC
  Administered 2019-04-11 – 2019-04-14 (×10): 1 g via INTRAVENOUS
  Filled 2019-04-11 (×12): qty 50

## 2019-04-11 NOTE — Progress Notes (Addendum)
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  She was recently admitted at [facility] for metabolic encephalopathy.  At home she was not able to ambulate due to accumulation of fluid in her lungs.  Patient was admitted for the management of bilateral lower extremity cellulitis, edema and started on IV Lasix.  Also had constipation on presentation.  Assessment & Plan:   Principal Problem:   Fluid overload Active Problems:   COPD (chronic obstructive pulmonary disease) (HCC)   Morbid obesity (HCC)   DM type 2 (diabetes mellitus, type 2) (HCC)   Hypothyroidism   Acute renal failure superimposed on stage 3 chronic kidney disease (HCC)   Chronic respiratory failure with hypoxia (HCC)   CAD (coronary artery disease)   Chronic pain   Opioid dependence ([place])   Small cell lung cancer, left upper lobe (HCC)   Brain metastasis (HCC)   Lung cancer (HCC)    Fluid overload/bilateral lower extremity edema: She has history of diastolic CHF.  Echocardiogram done on 2/20 showed normal ejection fraction, impaired relaxation.  She has been started on IV Lasix with very good urine output.  Continue to monitor daily output, input, daily weight.  Currently on Lasix 40 mg IV twice a day which we will continue.  Edema is improving. I will check echocardiogram.  Bilateral lower extremity cellulitis: Most likely aggravated by lower extremity edema.  Continue cefazolin.  Constipation: Had a bowel movement yesterday after enema was given.  Continue aggressive bowel regimen  CKD stage IIIb: Baseline creatinine between 1.7-1.9.  Currently kidney function at baseline.  Diabetes type 2:  Continue current insulin regimen.  Continue to monitor CBGs.  Small cell lung cancer of left lung with brain metastasis: Follows with Dr. Burr Medico.  Coronary artery disease: Currently stable.  Denies any chest pain.  Continue current medicines  COPD: Currently stable.  Not on exacerbation.  Continue oxygen, nebulizations as needed.  On oxygen at 2 to 3 L/min at home.  Hypothyroidism: Continue Synthyroid  Morbid obesity: BMI of 44.6.  Deconditioning/debility: I will request for PT evaluation.           DVT prophylaxis: Lovenox Code Status: Full Family Communication: None present at the bedside Disposition Plan: Home in 1 to 2 days.   Consultants: None  Procedures: None  Antimicrobials:  Anti-infectives (From admission, onward)   Start     Dose/Rate Route Frequency Ordered Stop   04/10/19 0445  ceFAZolin (ANCEF) IVPB 1 g/50 mL premix     1 g 100 mL/hr over 30 Minutes Intravenous  Once 04/10/19 0433 04/10/19 0612      Subjective:  Patient seen and examined the bedside this morning.  Hemodynamically stable.  Looked comfortable during my evaluation.  Bilateral lower extremity mild improving.  She states she still wants to have  another bowel movement.  She had a big bowel movement yesterday.  Objective: Vitals:   04/10/19 1245 04/10/19 2148 04/11/19 0500 04/11/19 0749  BP: (!) 148/61 (!) 141/64 132/65   Pulse: 91 82 65   Resp: 18 18 18    Temp: 98.5 F (36.9 C) 98.9 F (37.2 C) 98.7 F (37.1 C)   TempSrc: Oral Oral Oral  SpO2: 100% 100% 100% 98%  Weight:   125.4 kg   Height:        Intake/Output Summary (Last 24 hours) at 04/11/2019 1257 Last data filed at 04/11/2019 1145 Gross per 24 hour  Intake 558.25 ml  Output 2250 ml  Net -1691.75 ml   Filed Weights   04/10/19 0759 04/11/19 0500  Weight: 126.2 kg 125.4 kg    Examination:  General exam: Appears calm and comfortable ,Not in distress, morbidly obese  HEENT:PERRL,Oral mucosa moist, Ear/Nose normal  on gross exam Respiratory system: Diminished air entry on the bases, no wheezes or crackles  Cardiovascular system: S1 & S2 heard, RRR. No JVD, murmurs, rubs, gallops or clicks. 2-3+ pedal edema. Gastrointestinal system: Abdomen is nondistended, soft and nontender. No organomegaly or masses felt. Normal bowel sounds heard. Central nervous system: Alert and oriented. No focal neurological deficits. Extremities: 2-3+ bilateral lower extremity edema, no clubbing ,no cyanosis Skin: No rashes, lesions or ulcers,no icterus ,no pallor   Data Reviewed: I have personally reviewed following labs and imaging studies  CBC: Recent Labs  Lab 04/10/19 0018 04/11/19 0459  WBC 6.2 5.6  NEUTROABS 4.7  --   HGB 9.5* 9.8*  HCT 31.1* 31.8*  MCV 101.3* 100.6*  PLT 218 536   Basic Metabolic Panel: Recent Labs  Lab 04/10/19 0018 04/11/19 0459  NA 136  --   K 3.9  --   CL 101  --   CO2 24  --   GLUCOSE 261*  --   BUN 50*  --   CREATININE 2.22* 1.77*  CALCIUM 9.2  --    GFR: Estimated Creatinine Clearance: 40.6 mL/min (A) (by C-G formula based on SCr of 1.77 mg/dL (H)). Liver Function Tests: Recent Labs  Lab 04/10/19 0018  AST 19  ALT 12  ALKPHOS 87  BILITOT 0.5  PROT 6.8  ALBUMIN 3.6   No results for input(s): LIPASE, AMYLASE in the last 168 hours. No results for input(s): AMMONIA in the last 168 hours. Coagulation Profile: No results for input(s): INR, PROTIME in the last 168 hours. Cardiac Enzymes: No results for input(s): CKTOTAL, CKMB, CKMBINDEX, TROPONINI in the last 168 hours. BNP (last 3 results) No results for input(s): PROBNP in the last 8760 hours. HbA1C: Recent Labs    04/11/19 0459  HGBA1C 8.4*   CBG: Recent Labs  Lab 04/10/19 1136 04/10/19 1656 04/10/19 2151 04/11/19 0739 04/11/19 1143  GLUCAP 357* 291* 252* 286* 237*   Lipid Profile: No results for input(s): CHOL, HDL, LDLCALC, TRIG, CHOLHDL, LDLDIRECT in the last 72 hours. Thyroid Function Tests: No  results for input(s): TSH, T4TOTAL, FREET4, T3FREE, THYROIDAB in the last 72 hours. Anemia Panel: No results for input(s): VITAMINB12, FOLATE, FERRITIN, TIBC, IRON, RETICCTPCT in the last 72 hours. Sepsis Labs: Recent Labs  Lab 04/10/19 0018  LATICACIDVEN 1.4    Recent Results (from the past 240 hour(s))  SARS CORONAVIRUS 2 (TAT 6-24 HRS) Nasopharyngeal Nasopharyngeal Swab     Status: None   Collection Time: 04/10/19  3:15 AM   Specimen: Nasopharyngeal Swab  Result Value Ref Range Status   SARS Coronavirus 2 NEGATIVE NEGATIVE Final    Comment: (NOTE) SARS-CoV-2 target nucleic acids are NOT DETECTED. The SARS-CoV-2 RNA is generally detectable in upper and lower respiratory specimens during the acute phase of infection. Negative results do not preclude SARS-CoV-2 infection, do not rule out co-infections with other pathogens, and should not be used as the sole basis for treatment or other patient  management decisions. Negative results must be combined with clinical observations, patient history, and epidemiological information. The expected result is Negative. Fact Sheet for Patients: SugarRoll.be Fact Sheet for Healthcare Providers: https://www.woods-mathews.com/ This test is not yet approved or cleared by the [place] FDA and  has been authorized for detection and/or diagnosis of SARS-CoV-2 by FDA under an Emergency Use Authorization (EUA). This EUA will remain  in effect (meaning this test can be used) for the duration of the COVID-19 declaration under Section 56 4(b)(1) of the Act, 21 U.S.C. section 360bbb-3(b)(1), unless the authorization is terminated or revoked sooner. Performed at [facility], [place] [address] [place], [place] [address]          Radiology Studies: Dg Chest Portable 1 View  Result Date: 04/10/2019 CLINICAL DATA:  CHF EXAM: PORTABLE CHEST 1 VIEW COMPARISON:  Radiograph 03/31/2019 FINDINGS:  Increasing hazy interstitial opacities in the lung bases with mild cardiomegaly and indistinct pulmonary vascularity with few septal lines. Some atelectatic opacity is noted in the right lung base as well. No pneumothorax. No visible effusion. No acute osseous or soft tissue abnormality. IMPRESSION: Features suggest mild developing interstitial edema. Mild cardiomegaly. Electronically Signed   By: Lovena Le M.D.   On: 04/10/2019 00:11        Scheduled Meds: . allopurinol  300 mg Oral Daily  . aspirin EC  81 mg Oral Daily  . atorvastatin  80 mg Oral q1800  . busPIRone  7.5 mg Oral BID  . enoxaparin (LOVENOX) injection  40 mg Subcutaneous Q24H  . furosemide  40 mg Intravenous Q12H  . gabapentin  600 mg Oral TID  . insulin aspart  0-15 Units Subcutaneous TID WC  . insulin aspart  0-5 Units Subcutaneous QHS  . insulin detemir  40 Units Subcutaneous QHS  . levothyroxine  200 mcg Oral Q0600  . pantoprazole  40 mg Oral BID  . polyethylene glycol  17 g Oral BID  . senna  1 tablet Oral Daily  . silver sulfADIAZINE  1 application Topical BID  . sucralfate  1 g Oral TID WC & HS  . topiramate  75 mg Oral QHS  . umeclidinium-vilanterol  1 puff Inhalation Daily   Continuous Infusions:   LOS: 1 day    Time spent: 35 mins.More than 50% of that time was spent in counseling and/or coordination of care.      Shelly Coss, MD [facility] Pager [phone]  If 7PM-7AM, please contact night-coverage [email or web address] Password [facility] 04/11/2019, 12:57 PM

## 2019-04-11 NOTE — Consult Note (Signed)
   [facility] CM Inpatient Consult   04/11/2019  NATOSHA BOU 1950/07/26 [id number]  Patient chart has been reviewed for readmissions less than 30 days and for extreme risk score, 55%, for unplanned readmissions.  Patient has been engaged with [facility] community RN for follow up needs.   Will continue to follow for progression and disposition plans.  Netta Cedars, MSN, [facility] Liaison Nurse Mobile Phone [phone]  Toll free office [phone]

## 2019-04-11 NOTE — Telephone Encounter (Signed)
-----   Message from Hayden Pedro, [place] sent at 04/11/2019  1:29 PM EST ----- Regarding: RE: Medication refill request There has been no data to show narcotics help any more than otc tylenol or excedrin. I would say she could try those, I dont think she needs dexamethasone since her disease was smaller than a centimeter. ----- Message ----- From: Heywood Footman, RN Sent: 04/06/2019   3:02 PM EST To: Kyung Rudd, MD, Hayden Pedro, PA-C Subject: Medication refill request                      Refill request for Tylenol 3 with codeine received via fax today from [facility]. Patient reports taking Tylenol 3 with codeine to manage headaches. Noted that patient was recently discharged from the hospital after an episode of the following:Acute metabolic encephalopathy secondary to AKI with polypharmacy as well as community-acquired pneumonia.  Small cell lung ca with brain mets. Completed radiation on 03/29/2019.    Sam

## 2019-04-11 NOTE — Telephone Encounter (Signed)
Returned fax refill request for Tylenol 3 to [facility]. Per Shona Simpson, PA-C refill is denied. Fax confirmation of delivery obtained.

## 2019-04-11 NOTE — Progress Notes (Signed)
Pharmacy Antibiotic Note  Stephanie Combs is a 68 y.o. female admitted on 04/09/2019 with cellulitis.  Pharmacy has been consulted for cefazolin dosing.  Pt has PCN allergy documented, but has tolerated cephalosporins in the past before. Cefazolin dose charted on 04/10/19 as well as earlier this year.   Today, 04/11/19  SCr 1.77, CrCl ~41 mL/min  Afebrile  Plan:  Cefazolin 1 g IV q8h  Follow renal function  Height: 5\' 6"  (167.6 cm) Weight: 276 lb 7.3 oz (125.4 kg) IBW/kg (Calculated) : 59.3  Temp (24hrs), Avg:98.8 F (37.1 C), Min:98.7 F (37.1 C), Max:98.9 F (37.2 C)  Recent Labs  Lab 04/10/19 0018 04/11/19 0459  WBC 6.2 5.6  CREATININE 2.22* 1.77*  LATICACIDVEN 1.4  --     Estimated Creatinine Clearance: 40.6 mL/min (A) (by C-G formula based on SCr of 1.77 mg/dL (H)).    Allergies  Allergen Reactions  . Dilaudid [Hydromorphone Hcl] Itching  . Midodrine Hcl Swelling    After one dose had anaphylactic reaction, had to call EMS.  Marland Kitchen Actifed Cold-Allergy [Chlorpheniramine-Phenylephrine]     "I was sick and red and it didn't agree with me at all"  . Doxycycline Nausea And Vomiting  . Other Cough    Pt states she is allergic to ragweed and that she starts coughing and sneezing like crazy  . Penicillins Hives and Itching    Tolerates Rocephin Has patient had a PCN reaction causing immediate rash, facial/tongue/throat swelling, SOB or lightheadedness with hypotension: Yes Has patient had a PCN reaction causing severe rash involving mucus membranes or skin necrosis: No Has patient had a PCN reaction that required hospitalization Yes Has patient had a PCN reaction occurring within the last 10 years: No If all of the above answers are "NO", then may proceed with Cephalosporin use.   . Reglan [Metoclopramide] Itching  . Valium [Diazepam] Itching  . Vistaril [Hydroxyzine Hcl] Itching    Antimicrobials this admission: cefazolin 11/30 >>   Dose adjustments this  admission:  Microbiology results: 11/29 SARS-2: Negative  Thank you for allowing pharmacy to be a part of this patient's care.  Lenis Noon, PharmD 04/11/2019 1:07 PM

## 2019-04-11 NOTE — TOC Initial Note (Signed)
Transition of Care [facility]) - Initial/Assessment Note    Patient Details  Name: Stephanie Combs MRN: [id number] Date of Birth: Jul 28, 1950  [facility] CM/SW Contact:    Dessa Phi, RN Phone Number: 04/11/2019, 2:35 PM  Clinical Narrative:  No d/c needs.                 Expected Discharge Plan: Home/Self Care Barriers to Discharge: Continued Medical Work up   Patient Goals and CMS Choice        Expected Discharge Plan and Services Expected Discharge Plan: Home/Self Care       Living arrangements for the past 2 months: Apartment                                      Prior Living Arrangements/Services Living arrangements for the past 2 months: Apartment Lives with:: Self Patient language and need for interpreter reviewed:: Yes Do you feel safe going back to the place where you live?: Yes      Need for Family Participation in Patient Care: No (Comment) Care giver support system in place?: Yes (comment)   Criminal Activity/Legal Involvement Pertinent to Current Situation/Hospitalization: No - Comment as needed  Activities of Daily Living Home Assistive Devices/Equipment: Walker (specify type) ADL Screening (condition at time of admission) Patient's cognitive ability adequate to safely complete daily activities?: No Is the patient deaf or have difficulty hearing?: No Does the patient have difficulty seeing, even when wearing glasses/contacts?: No Does the patient have difficulty concentrating, remembering, or making decisions?: Yes Patient able to express need for assistance with ADLs?: No Does the patient have difficulty dressing or bathing?: Yes Independently performs ADLs?: No Communication: Needs assistance Is this a change from baseline?: Pre-admission baseline Dressing (OT): Needs assistance Is this a change from baseline?: Pre-admission baseline Grooming: Needs assistance Is this a change from baseline?: Pre-admission baseline Feeding:  Independent Is this a change from baseline?: Pre-admission baseline Bathing: Needs assistance Is this a change from baseline?: Pre-admission baseline Toileting: Needs assistance Is this a change from baseline?: Pre-admission baseline In/Out Bed: Needs assistance Is this a change from baseline?: Pre-admission baseline Walks in Home: Needs assistance Is this a change from baseline?: Pre-admission baseline Does the patient have difficulty walking or climbing stairs?: Yes Weakness of Legs: Both Weakness of Arms/Hands: None  Permission Sought/Granted Permission sought to share information with : Case Manager Permission granted to share information with : Yes, Verbal Permission Granted              Emotional Assessment Appearance:: Appears stated age Attitude/Demeanor/Rapport: Gracious Affect (typically observed): Accepting Orientation: : Oriented to Self, Oriented to Place, Oriented to  Time, Oriented to Situation Alcohol / Substance Use: Not Applicable Psych Involvement: No (comment)  Admission diagnosis:  Peripheral edema [R60.9] Cellulitis of left lower extremity [L03.116] AKI (acute kidney injury) ([place]) [N17.9] Patient Active Problem List   Diagnosis Date Noted  . Fluid overload 04/10/2019  . Lung cancer ([place]) 04/10/2019  . Acute encephalopathy 04/02/2019  . Brain metastasis ([place]) 03/09/2019  . Acute on chronic diastolic heart failure ([place]) 10/11/2018  . Assistance needed with transportation 09/22/2018  . 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 Small cell lung cancer, left upper lobe ([place])   . Colonic mass 09/10/2018  . Colon polyps 09/10/2018  . Cellulitis of leg, left 09/10/2018  . Pain of lower extremity   .  Non-intractable vomiting   . Abdominal pain, epigastric   . Heme positive stool   . Acute on chronic diastolic CHF (congestive heart failure) ([place]) 09/06/2018  . Symptomatic anemia 08/31/2018  . Anemia 08/30/2018  . Chronic pain 07/05/2018  .  On home O2 07/05/2018  . Opioid dependence ([place]) 07/05/2018  . CAD (coronary artery disease) 05/14/2018  . AKI (acute kidney injury) ([place]) 04/05/2018  . Cellulitis 01/10/2018  . Cellulitis of right lower extremity 01/07/2018  . Altered mental status 01/06/2018  . Type 2 diabetes mellitus with hypoglycemia without coma ([place]) 11/24/2017  . Anxiety 11/24/2017  . Chronic respiratory failure with hypoxia ([place]) 11/24/2017  . Syncope 10/24/2017  . Bradycardia   . Syncope due to orthostatic hypotension 09/04/2017  . Obesity, Class III, BMI 40-49.9 (morbid obesity) ([place]) 09/04/2017  . Abnormal nuclear stress test 05/19/2017  . Atypical angina ([place]) 05/19/2017  . Edema 05/15/2017  . Skin ulcer ([place]) 01/30/2017  . Tinea cruris 10/10/2016  . Vitamin D deficiency 08/19/2016  . Personal history of noncompliance with medical treatment, presenting hazards to health 06/17/2016  . CAP (community acquired pneumonia) 07/25/2015  . Pressure ulcer 05/02/2015  . Hyponatremia 05/01/2015  . Acute-on-chronic kidney injury ([place]) 05/01/2015  . Uncontrolled type 2 diabetes mellitus with stage 3 chronic kidney disease ([place]) 05/01/2015  . Cellulitis of foot, right 03/24/2015  . Peripheral edema 12/04/2014  . Acute renal failure superimposed on stage 3 chronic kidney disease ([place]) 11/24/2014  . Bilateral lower extremity edema   . Diabetic ulcer of right great toe ([place])   . Foot ulcer due to secondary DM ([place]) 11/07/2014  . Diabetic ulcer of right foot ([place]) 11/07/2014  . Edema of lower extremity 05/27/2013  . CKD (chronic kidney disease), stage III ([place]) 04/14/2013  . Anemia in CKD (chronic kidney disease) 04/14/2013  . Chest pain 04/14/2013  . Dyspnea 04/14/2013  . Chronic diastolic CHF (congestive heart failure) ([place])   . Hypothyroidism   . COPD (chronic obstructive pulmonary disease) ([place]) 08/07/2012  . Morbid obesity ([place]) 08/07/2012  . DM type 2 (diabetes mellitus, type 2) ([place]) 08/07/2012  . HTN  (hypertension), benign 08/07/2012  . ARF (acute renal failure) ([place]) 08/07/2012   PCP:  [name], MD Pharmacy:   [facility] [place], [place] [address] [place] [address] Phone: [phone] Fax: [phone]  [place], [name] [address] Phone: [phone] Fax: [phone]     Social Determinants of Health (SDOH) Interventions    Readmission Risk Interventions Readmission Risk Prevention Plan 04/11/2019 10/13/2018 09/02/2018  Transportation Screening Complete Complete -  Medication Review (RN Care Manager) Complete Complete -  PCP or Specialist appointment within 3-5 days of discharge Complete (No Data) Complete  PCP/Specialist Appt Not Complete comments - Recent admit; remains acutely ill -  HRI or Home Care Consult Complete Complete -  SW Recovery Care/Counseling Consult Not Complete Complete -  SW Consult Not Complete Comments not appropriate - -  Palliative Care Screening Not Applicable Not Applicable -  [place] Not Applicable Not Applicable -  Some recent data might be hidden

## 2019-04-11 NOTE — Progress Notes (Signed)
  Echocardiogram 2D Echocardiogram has been performed.  Jennette Dubin 04/11/2019, 2:28 PM

## 2019-04-11 NOTE — TOC Initial Note (Signed)
Transition of Care [facility]) - Initial/Assessment Note    Patient Details  Name: Stephanie Combs MRN: [id number] Date of Birth: 07-16-1950  Transition of Care [facility]) CM/SW Contact:    Dessa Phi, RN Phone Number: 04/11/2019, 2:34 PM  Clinical Narrative:  PT cons-await recc.                 Expected Discharge Plan: Home/Self Care Barriers to Discharge: Continued Medical Work up   Patient Goals and CMS Choice        Expected Discharge Plan and Services Expected Discharge Plan: Home/Self Care       Living arrangements for the past 2 months: Apartment                                      Prior Living Arrangements/Services Living arrangements for the past 2 months: Apartment Lives with:: Self Patient language and need for interpreter reviewed:: Yes Do you feel safe going back to the place where you live?: Yes      Need for Family Participation in Patient Care: No (Comment) Care giver support system in place?: Yes (comment)   Criminal Activity/Legal Involvement Pertinent to Current Situation/Hospitalization: No - Comment as needed  Activities of Daily Living Home Assistive Devices/Equipment: Walker (specify type) ADL Screening (condition at time of admission) Patient's cognitive ability adequate to safely complete daily activities?: No Is the patient deaf or have difficulty hearing?: No Does the patient have difficulty seeing, even when wearing glasses/contacts?: No Does the patient have difficulty concentrating, remembering, or making decisions?: Yes Patient able to express need for assistance with ADLs?: No Does the patient have difficulty dressing or bathing?: Yes Independently performs ADLs?: No Communication: Needs assistance Is this a change from baseline?: Pre-admission baseline Dressing (OT): Needs assistance Is this a change from baseline?: Pre-admission baseline Grooming: Needs assistance Is this a change from baseline?: Pre-admission  baseline Feeding: Independent Is this a change from baseline?: Pre-admission baseline Bathing: Needs assistance Is this a change from baseline?: Pre-admission baseline Toileting: Needs assistance Is this a change from baseline?: Pre-admission baseline In/Out Bed: Needs assistance Is this a change from baseline?: Pre-admission baseline Walks in Home: Needs assistance Is this a change from baseline?: Pre-admission baseline Does the patient have difficulty walking or climbing stairs?: Yes Weakness of Legs: Both Weakness of Arms/Hands: None  Permission Sought/Granted Permission sought to share information with : Case Manager Permission granted to share information with : Yes, Verbal Permission Granted              Emotional Assessment Appearance:: Appears stated age Attitude/Demeanor/Rapport: Gracious Affect (typically observed): Accepting Orientation: : Oriented to Self, Oriented to Place, Oriented to  Time, Oriented to Situation Alcohol / Substance Use: Not Applicable Psych Involvement: No (comment)  Admission diagnosis:  Peripheral edema [R60.9] Cellulitis of left lower extremity [L03.116] AKI (acute kidney injury) ([place]) [N17.9] Patient Active Problem List   Diagnosis Date Noted  . Fluid overload 04/10/2019  . Lung cancer ([place]) 04/10/2019  . Acute encephalopathy 04/02/2019  . Brain metastasis ([place]) 03/09/2019  . Acute on chronic diastolic heart failure ([place]) 10/11/2018  . Assistance needed with transportation 09/22/2018  . Primary cancer of hepatic flexure of colon ([place])   . Small cell lung cancer, left upper lobe ([place])   . Colonic mass 09/10/2018  . Colon polyps 09/10/2018  . Cellulitis of leg, left 09/10/2018  . Pain of lower extremity   .  Non-intractable vomiting   . Abdominal pain, epigastric   . Heme positive stool   . Acute on chronic diastolic CHF (congestive heart failure) ([place]) 09/06/2018  . Symptomatic anemia 08/31/2018  . Anemia 08/30/2018  . Chronic  pain 07/05/2018  . On home O2 07/05/2018  . Opioid dependence ([place]) 07/05/2018  . CAD (coronary artery disease) 05/14/2018  . AKI (acute kidney injury) ([place]) 04/05/2018  . Cellulitis 01/10/2018  . Cellulitis of right lower extremity 01/07/2018  . Altered mental status 01/06/2018  . Type 2 diabetes mellitus with hypoglycemia without coma ([place]) 11/24/2017  . Anxiety 11/24/2017  . Chronic respiratory failure with hypoxia ([place]) 11/24/2017  . Syncope 10/24/2017  . Bradycardia   . Syncope due to orthostatic hypotension 09/04/2017  . Obesity, Class III, BMI 40-49.9 (morbid obesity) ([place]) 09/04/2017  . Abnormal nuclear stress test 05/19/2017  . Atypical angina ([place]) 05/19/2017  . Edema 05/15/2017  . Skin ulcer ([place]) 01/30/2017  . Tinea cruris 10/10/2016  . Vitamin D deficiency 08/19/2016  . Personal history of noncompliance with medical treatment, presenting hazards to health 06/17/2016  . CAP (community acquired pneumonia) 07/25/2015  . Pressure ulcer 05/02/2015  . Hyponatremia 05/01/2015  . Acute-on-chronic kidney injury ([place]) 05/01/2015  . Uncontrolled type 2 diabetes mellitus with stage 3 chronic kidney disease ([place]) 05/01/2015  . Cellulitis of foot, right 03/24/2015  . Peripheral edema 12/04/2014  . Acute renal failure superimposed on stage 3 chronic kidney disease ([place]) 11/24/2014  . Bilateral lower extremity edema   . Diabetic ulcer of right great toe ([place])   . Foot ulcer due to secondary DM ([place]) 11/07/2014  . Diabetic ulcer of right foot ([place]) 11/07/2014  . Edema of lower extremity 05/27/2013  . CKD (chronic kidney disease), stage III ([place]) 04/14/2013  . Anemia in CKD (chronic kidney disease) 04/14/2013  . Chest pain 04/14/2013  . Dyspnea 04/14/2013  . Chronic diastolic CHF (congestive heart failure) ([place])   . Hypothyroidism   . COPD (chronic obstructive pulmonary disease) ([place]) 08/07/2012  . Morbid obesity ([place]) 08/07/2012  . DM type 2 (diabetes mellitus, type 2) ([place])  08/07/2012  . HTN (hypertension), benign 08/07/2012  . ARF (acute renal failure) ([place]) 08/07/2012   PCP:  [name], MD Pharmacy:   [place], [place] [address] [place] [address] Phone: [phone] Fax: [phone]  [place], [name] - [name] [address] Phone: [phone] Fax: [phone]     Social Determinants of Health (SDOH) Interventions    Readmission Risk Interventions Readmission Risk Prevention Plan 04/11/2019 10/13/2018 09/02/2018  Transportation Screening Complete Complete -  Medication Review (RN Care Manager) Complete Complete -  PCP or Specialist appointment within 3-5 days of discharge Complete (No Data) Complete  PCP/Specialist Appt Not Complete comments - Recent admit; remains acutely ill -  HRI or Home Care Consult Complete Complete -  SW Recovery Care/Counseling Consult Not Complete Complete -  SW Consult Not Complete Comments not appropriate - -  Palliative Care Screening Not Applicable Not Applicable -  [place] Not Applicable Not Applicable -  Some recent data might be hidden

## 2019-04-12 ENCOUNTER — Telehealth: Payer: Self-pay

## 2019-04-12 DIAGNOSIS — L8990 Pressure ulcer of unspecified site, unspecified stage: Secondary | ICD-10-CM | POA: Insufficient documentation

## 2019-04-12 LAB — BASIC METABOLIC PANEL
Anion gap: 12 (ref 5–15)
BUN: 42 mg/dL — ABNORMAL HIGH (ref 8–23)
CO2: 28 mmol/L (ref 22–32)
Calcium: 8.6 mg/dL — ABNORMAL LOW (ref 8.9–10.3)
Chloride: 95 mmol/L — ABNORMAL LOW (ref 98–111)
Creatinine, Ser: 1.64 mg/dL — ABNORMAL HIGH (ref 0.44–1.00)
GFR calc Af Amer: 37 mL/min — ABNORMAL LOW (ref >60)
GFR calc non Af Amer: 32 mL/min — ABNORMAL LOW (ref >60)
Glucose, Bld: 400 mg/dL — ABNORMAL HIGH (ref 70–99)
Potassium: 4 mmol/L (ref 3.5–5.1)
Sodium: 135 mmol/L (ref 135–145)

## 2019-04-12 LAB — GLUCOSE, CAPILLARY
Glucose-Capillary: 228 mg/dL — ABNORMAL HIGH (ref 70–99)
Glucose-Capillary: 325 mg/dL — ABNORMAL HIGH (ref 70–99)
Glucose-Capillary: 324 mg/dL — ABNORMAL HIGH (ref 70–99)
Glucose-Capillary: 264 mg/dL — ABNORMAL HIGH (ref 70–99)
Glucose-Capillary: 287 mg/dL — ABNORMAL HIGH (ref 70–99)

## 2019-04-12 NOTE — Plan of Care (Signed)
  Problem: Clinical Measurements: Goal: Respiratory complications will improve Outcome: Progressing   Problem: Clinical Measurements: Goal: Cardiovascular complication will be avoided Outcome: Progressing   Problem: Nutrition: Goal: Adequate nutrition will be maintained Outcome: Progressing   Problem: Coping: Goal: Level of anxiety will decrease Outcome: Progressing   Problem: Elimination: Goal: Will not experience complications related to bowel motility Outcome: Progressing

## 2019-04-12 NOTE — Telephone Encounter (Signed)
Patient calls to let Dr. Burr Medico know that still is admitted to [facility] room 1114 for fluid overload.  She states she had so much fluid she was unable to walk.  I told her I would like her know.

## 2019-04-12 NOTE — Consult Note (Signed)
   [facility] CM Inpatient Consult   04/12/2019  Stephanie Combs August 24, 1950 [id number]   [facility] Follow up:  Patient screened for [facility] CM follow up services. Patient unplanned readmission risk score 60%, extreme.  Spoke with Ms. Fritzsche by telephone. HIPAA verified. Reviewed [facility] CM services with her and inquired about potential needs when she transitions home. Ms. Bolser states that she has transportation provided by her daughter. Denies need for medication assistance. States that she would like to have assistance with meals for a couple of weeks when she goes home.  Will notify community [facility] CM RN concerning patient request and make referral to [facility] social worker for follow up.  Of note, [facility] Care Management services does not replace or interfere with any services that are arranged by inpatient case management or social work.  Netta Cedars, MSN, [facility] Liaison Nurse Mobile Phone [phone]  Toll free office [phone]

## 2019-04-12 NOTE — Telephone Encounter (Signed)
Stephanie Combs,  Are you able to see her today or tomorrow? I will see her tomorrow. Thanks   Truitt Merle MD

## 2019-04-12 NOTE — Evaluation (Signed)
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  She was recently admitted at [facility] for metabolic encephalopathy  Clinical Impression  Pt admitted with above diagnosis.  Pt currently with functional limitations due to the deficits listed below (see PT Problem List). Pt will benefit from skilled PT to increase their independence and safety with mobility to allow discharge to the venue listed below.   Pt reports she has not mobilized well since d/c from [facility] (she states no one assisted her OOB during that admission despite her requests).  Pt assisted with standing and a few steps over to recliner.   Pt reports she plans to d/c home and has all DME.     Follow Up Recommendations Home health PT;Supervision/Assistance - 24 hour(pt states she will only d/c home)    Equipment Recommendations  None recommended by PT    Recommendations for Other Services       Precautions / Restrictions Precautions Precautions: Fall Precaution Comments: 2.5 L O2 baseline Restrictions Weight Bearing Restrictions: (P) No      Mobility  Bed Mobility Overal bed mobility: Needs Assistance Bed Mobility: Supine to Sit     Supine to sit: Min guard;HOB elevated     General bed mobility comments: increased time and effort however no physical assist required  Transfers Overall transfer level: Needs assistance Equipment used: Rolling walker (2 wheeled) Transfers: Sit to/from [facility] Sit to Stand: Min assist Stand pivot transfers: Min assist       General transfer comment: assist to rise and steady, cues for technique and hand placement; pt  felt too weak to ambulate however agreeable to sit up in recliner (remained on 2.5 L O2 )  Ambulation/Gait                Stairs            Wheelchair Mobility    Modified Rankin (Stroke Patients Only)       Balance Overall balance assessment: Needs assistance         Standing balance support: Bilateral upper extremity supported Standing balance-Leahy Scale: Poor Standing balance comment: reliant on UE support                             Pertinent Vitals/Pain Pain Assessment: Faces Faces Pain Scale: Hurts little more Pain Location: back Pain Descriptors / Indicators: Sore Pain Intervention(s): Repositioned    Home Living Family/patient expects to be discharged to:: Private residence Living Arrangements: Alone Available Help at Discharge: Personal care attendant;Family;Available PRN/intermittently(daughter works but assists as needed) Type of Home: Mobile home Home Access: Ramped entrance     [place]: One level Home Equipment: Environmental consultant - 2 wheels;Cane - single point;Shower seat;Bedside commode;Wheelchair - [employer] - 4 wheels Additional Comments: hs rollator but prefers RW for stability at this time    Prior Function Level of Independence: Needs assistance   Gait / Transfers Assistance Needed: reports she was just d/c from [facility] and has been too weak to ambulate around the house prior to this admission           Hand Dominance  Extremity/Trunk Assessment        Lower Extremity Assessment Lower Extremity Assessment: Generalized weakness       Communication   Communication: No difficulties  Cognition Arousal/Alertness: Awake/alert Behavior During Therapy: WFL for tasks assessed/performed Overall Cognitive Status: Within Functional Limits for tasks assessed                                        General Comments      Exercises     Assessment/Plan    PT Assessment Patient needs  continued PT services  PT Problem List Decreased strength;Decreased mobility;Decreased activity tolerance;Decreased balance;Decreased knowledge of use of DME       PT Treatment Interventions DME instruction;Therapeutic activities;Gait training;Therapeutic exercise;Patient/family education;Functional mobility training    PT Goals (Current goals can be found in the Care Plan section)  Acute Rehab PT Goals PT Goal Formulation: With patient Time For Goal Achievement: 04/26/19 Potential to Achieve Goals: Good    Frequency Min 3X/week   Barriers to discharge        Co-evaluation               AM-PAC PT "6 Clicks" Mobility  Outcome Measure Help needed turning from your back to your side while in a flat bed without using bedrails?: A Little Help needed moving from lying on your back to sitting on the side of a flat bed without using bedrails?: A Little Help needed moving to and from a bed to a chair (including a wheelchair)?: A Little Help needed standing up from a chair using your arms (e.g., wheelchair or bedside chair)?: A Little Help needed to walk in hospital room?: A Lot Help needed climbing 3-5 steps with a railing? : A Lot 6 Click Score: 16    End of Session Equipment Utilized During Treatment: Gait belt;Oxygen Activity Tolerance: Patient tolerated treatment well Patient left: in chair;with call bell/phone within reach Nurse Communication: Mobility status PT Visit Diagnosis: Difficulty in walking, not elsewhere classified (R26.2);Muscle weakness (generalized) (M62.81)    Time: 1610-9604 PT Time Calculation (min) (ACUTE ONLY): 34 min   Charges:   PT Evaluation $PT Eval Low Complexity: 1 Low PT Treatments $Therapeutic Activity: 8-22 mins      Carmelia Bake, PT, DPT Acute Rehabilitation Services Office: [phone] Pager: [phone]  Trena Platt 04/12/2019, 12:53 PM

## 2019-04-12 NOTE — Progress Notes (Signed)
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  She was recently admitted at [facility] for metabolic encephalopathy.  At home she was not able to ambulate due to accumulation of fluid in her lungs.  Patient was admitted for the management of bilateral lower extremity cellulitis, edema and started on IV Lasix and antibiotics.  Also had constipation on presentation.  Assessment & Plan:   Principal Problem:   Fluid overload Active Problems:   COPD (chronic obstructive pulmonary disease) (HCC)   Morbid obesity (HCC)   DM type 2 (diabetes mellitus, type 2) (HCC)   Hypothyroidism   Acute renal failure superimposed on stage 3 chronic kidney disease (HCC)   Chronic respiratory failure with hypoxia (HCC)   CAD (coronary artery disease)   Chronic pain   Opioid dependence ([place])   Small cell lung cancer, left upper lobe (HCC)   Brain metastasis (HCC)   Lung cancer (HCC)   Pressure injury of skin    Fluid overload/bilateral lower extremity edema: She has history of diastolic CHF.  Echocardiogram done on 2/20 showed normal ejection fraction, impaired relaxation.  She has been started on IV Lasix with very good urine output.  Continue to monitor daily output, input, daily weight.  Currently on Lasix 40 mg IV twice a day which we will continue.  Edema is improving. Echocardiogram showed ejection fraction more than 68%, grade 1 diastolic dysfunction.  Bilateral lower extremity cellulitis: Most likely aggravated by lower extremity edema.  Continue cefazolin.  Constipation: Continue aggressive bowel regimen.had a bowel movement before yesterday.  CKD stage IIIb: Baseline creatinine  between 1.7-1.9.  Currently kidney function at baseline.  Diabetes type 2: Continue current insulin regimen.  Continue to monitor CBGs.  Small cell lung cancer of left lung with brain metastasis: Follows with Dr. Burr Medico.  Coronary artery disease: Currently stable.  Denies any chest pain.  Continue current medicines  COPD: Currently stable.  Not on exacerbation.  Continue oxygen, nebulizations as needed.  On oxygen at 2 to 3 L/min at home.  Hypothyroidism: Continue Synthyroid  Morbid obesity: BMI of 44.6.  Deconditioning/debility:  PT evaluation done and recommended home health.           DVT prophylaxis: Lovenox Code Status: Full Family Communication: None present at the bedside Disposition Plan: Home likely tomorrow   Consultants: None  Procedures: None  Antimicrobials:  Anti-infectives (From admission, onward)   Start     Dose/Rate Route Frequency Ordered Stop   04/11/19 1400  ceFAZolin (ANCEF) IVPB 1 g/50 mL premix     1 g 100 mL/hr over 30 Minutes Intravenous Every 8 hours 04/11/19 1305     04/10/19 0445  ceFAZolin (ANCEF) IVPB 1 g/50 mL premix     1 g 100 mL/hr over 30 Minutes Intravenous  Once 04/10/19 0433 04/10/19 0612      Subjective:  Patient seen and examined at bedside this morning.  Hemodynamically stable.  Looks comfortable.  Not in any kind of distress.  Bilateral lower extremity edema and cellulitis improving with IV Lasix and antibiotics.  She had net  output of more than 2 L yesterday.  Continue diuresis for now.  Objective: Vitals:   04/11/19 2130  04/12/19 0233 04/12/19 0352 04/12/19 0719  BP: 134/64  132/63   Pulse: 85  79   Resp: 16  16   Temp: 98.9 F (37.2 C)  98.9 F (37.2 C)   TempSrc:      SpO2: 100%  100% 99%  Weight:  125.3 kg    Height:        Intake/Output Summary (Last 24 hours) at 04/12/2019 1334 Last data filed at 04/12/2019 1323 Gross per 24 hour  Intake 1738.6 ml  Output 3850 ml  Net -2111.4 ml   Filed Weights    04/10/19 0759 04/11/19 0500 04/12/19 0233  Weight: 126.2 kg 125.4 kg 125.3 kg    Examination:  General exam: Appears calm and comfortable ,Not in distress, morbidly obese  HEENT:PERRL,Oral mucosa moist, Ear/Nose normal on gross exam Respiratory system: Diminished air entry on the bases, no wheezes or crackles  Cardiovascular system: S1 & S2 heard, RRR. No JVD, murmurs, rubs, gallops or clicks. 2-3+ pedal edema. Gastrointestinal system: Abdomen is nondistended, soft and nontender. No organomegaly or masses felt. Normal bowel sounds heard. Central nervous system: Alert and oriented. No focal neurological deficits. Extremities: 2-3+ bilateral lower extremity edema, no clubbing ,no cyanosis Skin: No rashes, lesions or ulcers,no icterus ,no pallor   Data Reviewed: I have personally reviewed following labs and imaging studies  CBC: Recent Labs  Lab 04/10/19 0018 04/11/19 0459  WBC 6.2 5.6  NEUTROABS 4.7  --   HGB 9.5* 9.8*  HCT 31.1* 31.8*  MCV 101.3* 100.6*  PLT 218 962   Basic Metabolic Panel: Recent Labs  Lab 04/10/19 0018 04/11/19 0459 04/12/19 0435  NA 136  --  135  K 3.9  --  4.0  CL 101  --  95*  CO2 24  --  28  GLUCOSE 261*  --  400*  BUN 50*  --  42*  CREATININE 2.22* 1.77* 1.64*  CALCIUM 9.2  --  8.6*   GFR: Estimated Creatinine Clearance: 43.8 mL/min (A) (by C-G formula based on SCr of 1.64 mg/dL (H)). Liver Function Tests: Recent Labs  Lab 04/10/19 0018  AST 19  ALT 12  ALKPHOS 87  BILITOT 0.5  PROT 6.8  ALBUMIN 3.6   No results for input(s): LIPASE, AMYLASE in the last 168 hours. No results for input(s): AMMONIA in the last 168 hours. Coagulation Profile: No results for input(s): INR, PROTIME in the last 168 hours. Cardiac Enzymes: No results for input(s): CKTOTAL, CKMB, CKMBINDEX, TROPONINI in the last 168 hours. BNP (last 3 results) No results for input(s): PROBNP in the last 8760 hours. HbA1C: Recent Labs    04/11/19 0459  HGBA1C 8.4*    CBG: Recent Labs  Lab 04/11/19 0739 04/11/19 1143 04/11/19 2137 04/12/19 0744 04/12/19 1159  GLUCAP 286* 237* 350* 325* 324*   Lipid Profile: No results for input(s): CHOL, HDL, LDLCALC, TRIG, CHOLHDL, LDLDIRECT in the last 72 hours. Thyroid Function Tests: No results for input(s): TSH, T4TOTAL, FREET4, T3FREE, THYROIDAB in the last 72 hours. Anemia Panel: No results for input(s): VITAMINB12, FOLATE, FERRITIN, TIBC, IRON, RETICCTPCT in the last 72 hours. Sepsis Labs: Recent Labs  Lab 04/10/19 0018  LATICACIDVEN 1.4    Recent Results (from the past 240 hour(s))  SARS CORONAVIRUS 2 (TAT 6-24 HRS) Nasopharyngeal Nasopharyngeal Swab     Status: None   Collection Time: 04/10/19  3:15 AM   Specimen: Nasopharyngeal Swab  Result Value Ref Range Status   SARS Coronavirus 2 NEGATIVE NEGATIVE Final  Comment: (NOTE) SARS-CoV-2 target nucleic acids are NOT DETECTED. The SARS-CoV-2 RNA is generally detectable in upper and lower respiratory specimens during the acute phase of infection. Negative results do not preclude SARS-CoV-2 infection, do not rule out co-infections with other pathogens, and should not be used as the sole basis for treatment or other patient management decisions. Negative results must be combined with clinical observations, patient history, and epidemiological information. The expected result is Negative. Fact Sheet for Patients: SugarRoll.be Fact Sheet for Healthcare Providers: https://www.woods-mathews.com/ This test is not yet approved or cleared by the [place] FDA and  has been authorized for detection and/or diagnosis of SARS-CoV-2 by FDA under an Emergency Use Authorization (EUA). This EUA will remain  in effect (meaning this test can be used) for the duration of the COVID-19 declaration under Section 56 4(b)(1) of the Act, 21 U.S.C. section 360bbb-3(b)(1), unless the authorization is terminated or revoked  sooner. Performed at [facility], [place] [address], [place], [place] [address]          Radiology Studies: No results found.      Scheduled Meds: . allopurinol  300 mg Oral Daily  . aspirin EC  81 mg Oral Daily  . atorvastatin  80 mg Oral q1800  . busPIRone  7.5 mg Oral BID  . enoxaparin (LOVENOX) injection  40 mg Subcutaneous Q24H  . furosemide  40 mg Intravenous Q12H  . gabapentin  600 mg Oral TID  . insulin aspart  0-15 Units Subcutaneous TID WC  . insulin aspart  0-5 Units Subcutaneous QHS  . insulin detemir  40 Units Subcutaneous QHS  . levothyroxine  200 mcg Oral Q0600  . pantoprazole  40 mg Oral BID  . polyethylene glycol  17 g Oral BID  . senna  1 tablet Oral Daily  . silver sulfADIAZINE  1 application Topical BID  . sucralfate  1 g Oral TID WC & HS  . topiramate  75 mg Oral QHS  . umeclidinium-vilanterol  1 puff Inhalation Daily   Continuous Infusions: .  ceFAZolin (ANCEF) IV 1 g (04/12/19 0548)     LOS: 2 days    Time spent: 35 mins.More than 50% of that time was spent in counseling and/or coordination of care.      Shelly Coss, MD [facility] Pager [phone]  If 7PM-7AM, please contact night-coverage [email or web address] Password [email or web address] 04/12/2019, 1:34 PM

## 2019-04-12 NOTE — Progress Notes (Addendum)
HEMATOLOGY-ONCOLOGY PROGRESS NOTE  SUBJECTIVE: Stephanie Combs presented to the emergency room with lower extremity edema.  She had a recent admission to [facility] for metabolic encephalopathy.  At the time of discharge, she reported inability to walk secondary to fluid on her legs reaccumulating.  She was noted to have an elevated BUN and creatinine on admission at 50 and 2.22 respectively.  Chest x-ray showed mild developing interstitial edema and mild cardiomegaly.  She was admitted for diuresis and she was also noted to have bilateral cellulitis and was started on antibiotics.  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  The patient was last seen by medical oncology on 03/09/2019.  At that visit, she was notified that she had new brain metastases.  She was referred to radiation oncology and completed whole brain radiation on 03/29/2019.  She had her last cycle of chemotherapy on 12/15/2018.  Patient reports that her lower extremity edema is improving.  She tells me that she was unable to walk prior to admission secondary to the lower extremity swelling is now able to ambulate short distances.  Breathing is stable.  Remains on O2.  Still having intermittent headaches.  Denies abdominal pain, nausea, vomiting.  Oncology History Overview Note  Cancer Staging Primary cancer of hepatic flexure of colon [facility]) Staging form: Colon and Rectum, AJCC 8th Edition - Clinical stage from 09/09/2018: Stage Unknown (cTX, cN0, cM0) - Signed by Truitt Merle, MD on 09/28/2018  Small cell lung cancer, left upper lobe ([place]) Staging form: Lung, AJCC 8th Edition - Clinical stage from 09/14/2018: Stage IIIB (cT3, cN2, cM0) - Signed by Truitt Merle, MD on 09/28/2018     Primary cancer of hepatic flexure of colon [facility])   Initial Diagnosis   Malignant neoplasm of hepatic flexure ([place])   09/09/2018 Procedure   Coloscopy by Dr Gala Romney 09/09/18  IMPRESSION -Colon mass as described in the vicinity of the  hepatic flexure - status post biopsy and inking. Polyps associated with this tumor scattered all the way to the cecum were not removed; multiple splenic flexure and rectal polyps were removed as described above. Left-sided diverticulosis.  Upper endoscopy By Dr Gala Romney 09/09/18  IMPRESSION -Abnormal distal esophagus suspicious for Barrett's esophagus?biopsied. - Small hiatal hernia. - Erosive gastropathy. Biopsied. -Duodenal bulbar erosions   09/09/2018 Initial Biopsy   Diagnosis 09/09/18 1. Stomach, biopsy - GASTRIC ANTRAL MUCOSA WITH MILD NONSPECIFIC REACTIVE GASTROPATHY - GASTRIC OXYNTIC MUCOSA WITH PARIETAL CELL HYPERPLASIA AS CAN BE SEEN IN HYPERGASTRINEMIC STATES SUCH AS PROPER PATIENT IDENTIFICATION THERAPY. - WARTHIN STARRY STAIN IS NEGATIVE FOR HELICOBACTER PYLORI 2. Esophagus, biopsy - BARRETT'S ESOPHAGUS, NEGATIVE FOR DYSPLASIA 3. Colon, polyp(s), descending - TUBULAR ADENOMA - NEGATIVE FOR HIGH-GRADE DYSPLASIA OR MALIGNANCY 4. Colon, biopsy, hepatic flexure - ADENOCARCINOMA. SEE NOTE 5. Colon, polyp(s), splenic flexure - ADENOCARCINOMA. SEE NOTE - OTHER FRAGMENTS OF TUBULAR ADENOMA(S) WITH ONE SHOWING HIGH-GRADE DYSPLASIA 6. Rectum, polyp(s) - TUBULAR ADENOMA(S) - NEGATIVE FOR HIGH-GRADE DYSPLASIA OR MALIGNANCY   09/09/2018 Cancer Staging   Staging form: Colon and Rectum, AJCC 8th Edition - Clinical stage from 09/09/2018: Stage Unknown (cTX, cN0, cM0) - Signed by Truitt Merle, MD on 09/28/2018   09/11/2018 Imaging   CT CAP 09/11/18  IMPRESSION: 1. Central perihilar left upper lobe 5.4 cm lung mass, new since 01/27/2017 chest CT, compatible with malignancy, favor primary bronchogenic carcinoma. Postobstructive pneumonia/atelectasis in the lingula. 2. Mediastinal adenopathy in the left prevascular/AP window and subcarinal stations, compatible with metastatic nodes (more  likely originating from the left upper lobe lung mass). 3. Trace dependent left pleural effusion. 4.  Apple-core lesion in the ascending colon, compatible with known primary colonic neoplasm. Adjacent top-normal size right mesenteric lymph node is equivocal for locoregional nodal metastasis. No additional potential findings of metastatic disease in the abdomen or pelvis. 5. Chronic findings include: Aortic Atherosclerosis (ICD10-I70.0). Borderline mild cardiomegaly. One vessel coronary atherosclerosis. Dilated main pulmonary artery, suggesting pulmonary arterial hypertension. Left adrenal adenoma. Punctate bilateral nephrolithiasis. Small hiatal hernia.   09/20/2018 Imaging   Brain MRI 09/20/18  IMPRESSION: 1. Motion degraded examination without brain metastases identified. 2. Small skull metastases not excluded. 3. Moderate to severe chronic small vessel ischemic disease.   Small cell lung cancer, left upper lobe ([place])  09/11/2018 Imaging   CT CAP 09/11/18  IMPRESSION: 1. Central perihilar left upper lobe 5.4 cm lung mass, new since 01/27/2017 chest CT, compatible with malignancy, favor primary bronchogenic carcinoma. Postobstructive pneumonia/atelectasis in the lingula. 2. Mediastinal adenopathy in the left prevascular/AP window and subcarinal stations, compatible with metastatic nodes (more likely originating from the left upper lobe lung mass). 3. Trace dependent left pleural effusion. 4. Apple-core lesion in the ascending colon, compatible with known primary colonic neoplasm. Adjacent top-normal size right mesenteric lymph node is equivocal for locoregional nodal metastasis. No additional potential findings of metastatic disease in the abdomen or pelvis. 5. Chronic findings include: Aortic Atherosclerosis (ICD10-I70.0). Borderline mild cardiomegaly. One vessel coronary atherosclerosis. Dilated main pulmonary artery, suggesting pulmonary arterial hypertension. Left adrenal adenoma. Punctate bilateral nephrolithiasis. Small hiatal hernia.    09/14/2018 Initial Biopsy   Diagnosis  5/5.20 Lung, biopsy, LUL - SMALL CELL CARCINOMA.   09/14/2018 Cancer Staging   Staging form: Lung, AJCC 8th Edition - Clinical stage from 09/14/2018: Stage IIIB (cT3, cN2, cM0) - Signed by Truitt Merle, MD on 09/28/2018   09/15/2018 Initial Diagnosis   Small cell lung cancer, left upper lobe ([place])   09/18/2018 - 12/17/2018 Chemotherapy   Carboplatin and etoposide every 3 weeks for 4 cycles starting 09/18/18. Held after C2D1 due to COVID-19(+). She restarted with cycle 3 on 11/03/18. Completed 5 cycles on 12/17/18.  RT was concurrnet but started on 11/24/18.   09/20/2018 Imaging   Brain MRI 09/20/18  IMPRESSION: 1. Motion degraded examination without brain metastases identified. 2. Small skull metastases not excluded. 3. Moderate to severe chronic small vessel ischemic disease.    11/24/2018 - 01/06/2019 Radiation Therapy    RT was postponed to 11/24/18. Only concurrent with chemo for 2 cycles. Completed on 01/06/19   03/03/2019 Imaging   MRI Brain  IMPRESSION: Interval development of 3 metastatic deposits in the brain as above   Atrophy and moderate chronic microvascular ischemic changes in the white matter.      REVIEW OF SYSTEMS:   Constitutional: Denies fevers and chills Eyes: Denies blurriness of vision Respiratory: Reports her baseline shortness of breath Cardiovascular: Denies palpitation, chest discomfort Gastrointestinal:  Denies nausea, heartburn or change in bowel habits Skin: Denies abnormal skin rashes Lymphatics: Denies new lymphadenopathy or easy bruising Neurological:Denies numbness, tingling or new weaknesses Behavioral/Psych: Mood is stable, no new changes  Extremities: Reports swelling in the lower extremities has improved All other systems were reviewed with the patient and are negative.  I have reviewed the past medical history, past surgical history, social history and family history with the patient and they are unchanged from previous note.   PHYSICAL  EXAMINATION: ECOG PERFORMANCE STATUS: 3 - Symptomatic, >50% confined to bed  Vitals:  04/12/19 0352 04/12/19 0719  BP: 132/63   Pulse: 79   Resp: 16   Temp: 98.9 F (37.2 C)   SpO2: 100% 99%   Filed Weights   04/10/19 0759 04/11/19 0500 04/12/19 0233  Weight: 278 lb 3.5 oz (126.2 kg) 276 lb 7.3 oz (125.4 kg) 276 lb 3.8 oz (125.3 kg)    Intake/Output from previous day: 11/30 0701 - 12/01 0700 In: 1138.6 [P.O.:1080; IV Piggyback:58.6] Out: 3450 [Urine:3450]  GENERAL:alert, no distress and comfortable SKIN: skin color, texture, turgor are normal, no rashes or significant lesions EYES: normal, Conjunctiva are pink and non-injected, sclera clear OROPHARYNX: No thrush or mucositis LUNGS: Diminished breath sounds in the lung bases HEART: regular rate & rhythm and no murmurs and 2-3+ pedal edema ABDOMEN:abdomen soft, non-tender and normal bowel sounds Musculoskeletal:no cyanosis of digits and no clubbing  NEURO: alert & oriented x 3 with fluent speech, no focal motor/sensory deficits  LABORATORY DATA:  I have reviewed the data as listed CMP Latest Ref Rng & Units 04/12/2019 04/11/2019 04/10/2019  Glucose 70 - 99 mg/dL 400(H) - 261(H)  BUN 8 - 23 mg/dL 42(H) - 50(H)  Creatinine 0.44 - 1.00 mg/dL 1.64(H) 1.77(H) 2.22(H)  Sodium 135 - 145 mmol/L 135 - 136  Potassium 3.5 - 5.1 mmol/L 4.0 - 3.9  Chloride 98 - 111 mmol/L 95(L) - 101  CO2 22 - 32 mmol/L 28 - 24  Calcium 8.9 - 10.3 mg/dL 8.6(L) - 9.2  Total Protein 6.5 - 8.1 g/dL - - 6.8  Total Bilirubin 0.3 - 1.2 mg/dL - - 0.5  Alkaline Phos 38 - 126 U/L - - 87  AST 15 - 41 U/L - - 19  ALT 0 - 44 U/L - - 12    Lab Results  Component Value Date   WBC 5.6 04/11/2019   HGB 9.8 (L) 04/11/2019   HCT 31.8 (L) 04/11/2019   MCV 100.6 (H) 04/11/2019   PLT 202 04/11/2019   NEUTROABS 4.7 04/10/2019    Ct Head Wo Contrast  Result Date: 03/31/2019 CLINICAL DATA:  Patient with altered mental status.  Colon cancer. EXAM: CT HEAD  WITHOUT CONTRAST TECHNIQUE: Contiguous axial images were obtained from the base of the skull through the vertex without intravenous contrast. COMPARISON:  Brain CT 01/06/2018. FINDINGS: Brain: Ventricles and sulci are prominent compatible with atrophy. Periventricular and subcortical white matter hypodensities compatible with chronic microvascular ischemic changes. No evidence for acute cortically based infarct, intracranial hemorrhage, mass lesion or mass-effect. Vascular: Unremarkable Skull: Intact. Sinuses/Orbits: Paranasal sinuses are well aerated. Mastoid air cells are unremarkable. Orbits are unremarkable. Other: None. IMPRESSION: No acute intracranial process. Atrophy and chronic microvascular ischemic changes. Electronically Signed   By: Lovey Newcomer M.D.   On: 03/31/2019 18:28   Mr Brain Wo Contrast  Result Date: 04/01/2019 CLINICAL DATA:  Altered level of consciousness (LOC), unexplained altered mental status. EXAM: MRI HEAD WITHOUT CONTRAST TECHNIQUE: Multiplanar, multiecho pulse sequences of the brain and surrounding structures were obtained without intravenous contrast. COMPARISON:  Head CT 03/31/2019, brain MRI 03/03/2019. FINDINGS: Brain: There is mild to moderate motion degradation of multiple sequences, somewhat limiting evaluation. There is no convincing evidence of acute infarct. No midline shift or extra-axial fluid collection. A previously demonstrated subcentimeter hemorrhagic metastasis within the left posterior cerebellum, and 2 additional subcentimeter metastatic lesions within the right temporal lobe, were better appreciated on prior contrast-enhanced MRI brain 03/03/2019. Persistent small amount of vasogenic edema surrounding the temporal lobe lesions. Stable generalized parenchymal atrophy and  chronic small vessel ischemic disease. Vascular: Flow voids maintained within the proximal large arterial vessels. Skull and upper cervical spine: No focal marrow lesion Sinuses/Orbits:  Visualized orbits demonstrate no acute abnormality. Right mastoid effusion. IMPRESSION: 1. Motion degraded examination. 2. No evidence of acute infarct. 3. Subcentimeter metastatic lesions within the left cerebellum and right temporal lobe (3 total) were better appreciated on prior contrast-enhanced brain MRI 03/03/2019. 4. Stable generalized parenchymal atrophy and chronic small vessel ischemic disease. 5. Right mastoid effusion. Electronically Signed   By: Kellie Simmering DO   On: 04/01/2019 07:59   Dg Chest Portable 1 View  Result Date: 04/10/2019 CLINICAL DATA:  CHF EXAM: PORTABLE CHEST 1 VIEW COMPARISON:  Radiograph 03/31/2019 FINDINGS: Increasing hazy interstitial opacities in the lung bases with mild cardiomegaly and indistinct pulmonary vascularity with few septal lines. Some atelectatic opacity is noted in the right lung base as well. No pneumothorax. No visible effusion. No acute osseous or soft tissue abnormality. IMPRESSION: Features suggest mild developing interstitial edema. Mild cardiomegaly. Electronically Signed   By: Lovena Le M.D.   On: 04/10/2019 00:11   Dg Chest Port 1 View  Result Date: 03/31/2019 CLINICAL DATA:  Altered mental status EXAM: PORTABLE CHEST 1 VIEW COMPARISON:  September 2020 FINDINGS: Right lung is clear. Left hilar mass is not well evaluated. Patchy increased density in the left upper lung. No pleural effusion. No pneumothorax. Normal heart size for portable technique. IMPRESSION: Patchy density in the left upper lung that may reflect atelectasis/consolidation, potentially postobstructive given hilar mass on prior imaging. Electronically Signed   By: Macy Mis M.D.   On: 03/31/2019 17:04    ASSESSMENT AND PLAN: 1.  Small cell lung cancer with brain metastases 2.  Colorectal adenocarcinoma 3.  Fluid overload/bilateral lower extremity edema 4.  Bilateral lower extremity cellulitis 5.  CKD stage IIIb 6.  Diabetes mellitus 7.  COPD  -She is status post  concurrent chemo radiation for her small cell lung cancer.  The patient recently completed whole brain radiation.  Will need to have further discussion regarding systemic treatment for her small cell lung cancer. -The patient is not a surgical candidate for her colon cancer.  May consider low intensity chemo such as Xeloda or Irinotecan (which would be effective in small cell lung cancer as well). -Continue diuresis and IV antibiotics per hospitalist.   LOS: 2 days   Mikey Bussing, DNP, AGPCNP-BC, AOCNP 04/12/19  Addendum  I have seen the patient, examined her. I agree with the assessment and and plan and have edited the notes.   Fredricka feels much better overall, she is responding well to diuretics. She is alert, oriented, and has started getting out of bed. I have reviewed her recent lab. She has recently completed whole brain radiation, recently admitted at [facility] for encephalopathy which has clearly resolved. Continue diuretics per primary team. I previously discussed hospice with her and she was not ready. I will see her back in my clinic after hospital discharge, to decide what to do next about her metastatic small cell lung cancer and unresectable colon cancer, will likely continue supportive care.   Truitt Merle  04/13/2019

## 2019-04-13 DIAGNOSIS — C3490 Malignant neoplasm of unspecified part of unspecified bronchus or lung: Secondary | ICD-10-CM

## 2019-04-13 DIAGNOSIS — E8779 Other fluid overload: Secondary | ICD-10-CM

## 2019-04-13 DIAGNOSIS — C7931 Secondary malignant neoplasm of brain: Secondary | ICD-10-CM

## 2019-04-13 LAB — BASIC METABOLIC PANEL WITH GFR
Anion gap: 10 (ref 5–15)
BUN: 43 mg/dL — ABNORMAL HIGH (ref 8–23)
CO2: 31 mmol/L (ref 22–32)
Calcium: 8.4 mg/dL — ABNORMAL LOW (ref 8.9–10.3)
Chloride: 93 mmol/L — ABNORMAL LOW (ref 98–111)
Creatinine, Ser: 1.61 mg/dL — ABNORMAL HIGH (ref 0.44–1.00)
GFR calc Af Amer: 37 mL/min — ABNORMAL LOW
GFR calc non Af Amer: 32 mL/min — ABNORMAL LOW
Glucose, Bld: 405 mg/dL — ABNORMAL HIGH (ref 70–99)
Potassium: 4.3 mmol/L (ref 3.5–5.1)
Sodium: 134 mmol/L — ABNORMAL LOW (ref 135–145)

## 2019-04-13 LAB — GLUCOSE, CAPILLARY
Glucose-Capillary: 332 mg/dL — ABNORMAL HIGH (ref 70–99)
Glucose-Capillary: 343 mg/dL — ABNORMAL HIGH (ref 70–99)
Glucose-Capillary: 336 mg/dL — ABNORMAL HIGH (ref 70–99)
Glucose-Capillary: 338 mg/dL — ABNORMAL HIGH (ref 70–99)

## 2019-04-13 MED ORDER — INSULIN DETEMIR 100 UNIT/ML ~~LOC~~ SOLN
50.0000 [IU] | Freq: Every day | SUBCUTANEOUS | Status: DC
  Administered 2019-04-13 – 2019-04-14 (×2): 50 [IU] via SUBCUTANEOUS
  Filled 2019-04-13 (×3): qty 0.5

## 2019-04-13 MED ORDER — PRO-STAT SUGAR FREE PO LIQD
30.0000 mL | Freq: Two times a day (BID) | ORAL | Status: DC
  Administered 2019-04-13 – 2019-04-15 (×3): 30 mL via ORAL
  Filled 2019-04-13 (×5): qty 30

## 2019-04-13 MED ORDER — ENSURE MAX PROTEIN PO LIQD
11.0000 [oz_av] | Freq: Every day | ORAL | Status: DC
  Filled 2019-04-13 (×3): qty 330

## 2019-04-13 MED ORDER — INSULIN ASPART 100 UNIT/ML ~~LOC~~ SOLN
0.0000 [IU] | Freq: Three times a day (TID) | SUBCUTANEOUS | Status: DC
  Administered 2019-04-13 – 2019-04-14 (×4): 11 [IU] via SUBCUTANEOUS
  Administered 2019-04-14: 8 [IU] via SUBCUTANEOUS
  Administered 2019-04-15: 5 [IU] via SUBCUTANEOUS
  Administered 2019-04-15: 11 [IU] via SUBCUTANEOUS

## 2019-04-13 NOTE — TOC Progression Note (Signed)
Transition of Care [facility]) - Progression Note    Patient Details  Name: Stephanie Combs MRN: [id number] Date of Birth: [birth year]  Transition of Care [facility]) CM/SW Contact  Lanayah Gartley, Juliann Pulse, RN Phone Number: 04/13/2019, 11:31 AM  Clinical Narrative: [facility] Lemmings able to accept for HHPT-rep [facility] following for d/c & HHPT order.       Expected Discharge Plan: [place] Barriers to Discharge: Continued Medical Work up  Expected Discharge Plan and Services Expected Discharge Plan: [place]   Discharge Planning Services: CM Consult   Living arrangements for the past 2 months: Apartment                           HH Arranged: PT [place]: [place] Date [place]: 04/13/19 Time [place] Agency Contacted: 1130 Representative spoke with at [place]: [place] ([place]) Interventions    Readmission Risk Interventions Readmission Risk Prevention Plan 04/11/2019 10/13/2018 09/02/2018  Transportation Screening Complete Complete -  Medication Review [employer]) Complete Complete -  PCP or Specialist appointment within 3-5 days of discharge Complete (No Data) Complete  PCP/Specialist Appt Not Complete comments - Recent admit; remains acutely ill -  HRI or Home Care Consult Complete Complete -  SW Recovery Care/Counseling Consult Not Complete Complete -  SW Consult Not Complete Comments not appropriate - -  Palliative Care Screening Not Applicable Not Applicable -  [place] Not Applicable Not Applicable -  Some recent data might be hidden

## 2019-04-13 NOTE — Progress Notes (Signed)
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  She was recently admitted at [place] for metabolic encephalopathy.  At home she was not able to ambulate due to accumulation of fluid in her lungs.  Patient was admitted for the management of bilateral lower extremity cellulitis, edema and started on IV Lasix and antibiotics.  Also had constipation on presentation.  Assessment & Plan:   Principal Problem:   Fluid overload Active Problems:   COPD (chronic obstructive pulmonary disease) (HCC)   Morbid obesity (HCC)   DM type 2 (diabetes mellitus, type 2) (HCC)   Hypothyroidism   Acute renal failure superimposed on stage 3 chronic kidney disease (HCC)   Chronic respiratory failure with hypoxia (HCC)   CAD (coronary artery disease)   Chronic pain   Opioid dependence ([place])   Small cell lung cancer, left upper lobe (HCC)   Brain metastasis (HCC)   Lung cancer (HCC)   Pressure injury of skin    Fluid overload/bilateral lower extremity edema: She has history of diastolic CHF.  Echocardiogram done on 2/20 showed normal ejection fraction, impaired relaxation.  She has been started on IV Lasix with  good urine output.  Continue to monitor daily output, input, daily weight.  Currently on Lasix 40 mg IV twice a day which we will continue for tomday.  Edema is improving. Echocardiogram showed ejection fraction more than 18%, grade 1 diastolic dysfunction.  Bilateral lower extremity cellulitis: Most likely aggravated by lower extremity edema.  Continue cefazolin.  Constipation: Continue aggressive bowel regimen.had a bowel movement before yesterday.  CKD stage IIIb: Baseline  creatinine between 1.7-1.9.  Currently kidney function at baseline.  Diabetes type 2: Continue current insulin regimen.  Continue to monitor CBGs.  Small cell lung cancer of left lung with brain metastasis: Follows with Dr. Burr Medico.  Coronary artery disease: Currently stable.  Denies any chest pain.  Continue current medicines  COPD: Currently stable.  Not on exacerbation.  Continue oxygen, nebulizations as needed.  On oxygen at 2 to 3 L/min at home.  Hypothyroidism: Continue Synthyroid  Morbid obesity: BMI of 44.6.  Deconditioning/debility:  PT evaluation done and recommended home health.    Nutrition Problem: Increased nutrient needs Etiology: wound healing, acute illness      DVT prophylaxis: Lovenox Code Status: Full Family Communication: None present at the bedside Disposition Plan: Continue IV antibiotics and diuretics for today.  Discharge to home tomorrow.   Consultants: None  Procedures: None  Antimicrobials:  Anti-infectives (From admission, onward)   Start     Dose/Rate Route Frequency Ordered Stop   04/11/19 1400  ceFAZolin (ANCEF) IVPB 1 g/50 mL premix     1 g 100 mL/hr over 30 Minutes Intravenous Every 8 hours 04/11/19 1305     04/10/19 0445  ceFAZolin (ANCEF) IVPB 1 g/50 mL premix     1 g 100 mL/hr over 30 Minutes Intravenous  Once 04/10/19 0433 04/10/19 0612      Subjective:  Patient seen and examined at bedside this morning.  Hemodynamically stable.  Comfortable.  Feels better today.  Bilateral lower edema improving.  Objective: Vitals:   04/12/19 2134 04/13/19 0443 04/13/19 0642 04/13/19 0853  BP: (!) 125/48  Marland Kitchen)  103/50   Pulse: 87  88   Resp: 16  20   Temp: 98 F (36.7 C)  98.2 F (36.8 C)   TempSrc: Oral  Oral   SpO2: 100%  100% 91%  Weight:  124 kg    Height:        Intake/Output Summary (Last 24 hours) at 04/13/2019 1155 Last data filed at 04/13/2019 7564 Gross per 24 hour  Intake 1440.14 ml  Output 2700 ml  Net -1259.86 ml    Filed Weights   04/11/19 0500 04/12/19 0233 04/13/19 0443  Weight: 125.4 kg 125.3 kg 124 kg    Examination:  General exam: Appears calm and comfortable ,Not in distress, morbidly obese  HEENT:PERRL,Oral mucosa moist, Ear/Nose normal on gross exam Respiratory system: Diminished air entry on the bases, no wheezes or crackles  Cardiovascular system: S1 & S2 heard, RRR. No JVD, murmurs, rubs, gallops or clicks. 2-3+ pedal edema. Gastrointestinal system: Abdomen is nondistended, soft and nontender. No organomegaly or masses felt. Normal bowel sounds heard. Central nervous system: Alert and oriented. No focal neurological deficits. Extremities: 2-3+ bilateral lower extremity edema, no clubbing ,no cyanosis,improving cellulitis Skin: No rashes, lesions ,no icterus ,no pallor   Data Reviewed: I have personally reviewed following labs and imaging studies  CBC: Recent Labs  Lab 04/10/19 0018 04/11/19 0459  WBC 6.2 5.6  NEUTROABS 4.7  --   HGB 9.5* 9.8*  HCT 31.1* 31.8*  MCV 101.3* 100.6*  PLT 218 332   Basic Metabolic Panel: Recent Labs  Lab 04/10/19 0018 04/11/19 0459 04/12/19 0435 04/13/19 0457  NA 136  --  135 134*  K 3.9  --  4.0 4.3  CL 101  --  95* 93*  CO2 24  --  28 31  GLUCOSE 261*  --  400* 405*  BUN 50*  --  42* 43*  CREATININE 2.22* 1.77* 1.64* 1.61*  CALCIUM 9.2  --  8.6* 8.4*   GFR: Estimated Creatinine Clearance: 44.4 mL/min (A) (by C-G formula based on SCr of 1.61 mg/dL (H)). Liver Function Tests: Recent Labs  Lab 04/10/19 0018  AST 19  ALT 12  ALKPHOS 87  BILITOT 0.5  PROT 6.8  ALBUMIN 3.6   No results for input(s): LIPASE, AMYLASE in the last 168 hours. No results for input(s): AMMONIA in the last 168 hours. Coagulation Profile: No results for input(s): INR, PROTIME in the last 168 hours. Cardiac Enzymes: No results for input(s): CKTOTAL, CKMB, CKMBINDEX, TROPONINI in the last 168 hours. BNP (last 3 results) No results for input(s): PROBNP  in the last 8760 hours. HbA1C: Recent Labs    04/11/19 0459  HGBA1C 8.4*   CBG: Recent Labs  Lab 04/12/19 1159 04/12/19 1643 04/12/19 2137 04/13/19 0751 04/13/19 1152  GLUCAP 324* 264* 287* 332* 343*   Lipid Profile: No results for input(s): CHOL, HDL, LDLCALC, TRIG, CHOLHDL, LDLDIRECT in the last 72 hours. Thyroid Function Tests: No results for input(s): TSH, T4TOTAL, FREET4, T3FREE, THYROIDAB in the last 72 hours. Anemia Panel: No results for input(s): VITAMINB12, FOLATE, FERRITIN, TIBC, IRON, RETICCTPCT in the last 72 hours. Sepsis Labs: Recent Labs  Lab 04/10/19 0018  LATICACIDVEN 1.4    Recent Results (from the past 240 hour(s))  SARS CORONAVIRUS 2 (TAT 6-24 HRS) Nasopharyngeal Nasopharyngeal Swab     Status: None   Collection Time: 04/10/19  3:15 AM   Specimen: Nasopharyngeal Swab  Result Value Ref Range Status   SARS Coronavirus 2 NEGATIVE NEGATIVE Final  Comment: (NOTE) SARS-CoV-2 target nucleic acids are NOT DETECTED. The SARS-CoV-2 RNA is generally detectable in upper and lower respiratory specimens during the acute phase of infection. Negative results do not preclude SARS-CoV-2 infection, do not rule out co-infections with other pathogens, and should not be used as the sole basis for treatment or other patient management decisions. Negative results must be combined with clinical observations, patient history, and epidemiological information. The expected result is Negative. Fact Sheet for Patients: SugarRoll.be Fact Sheet for Healthcare Providers: https://www.woods-mathews.com/ This test is not yet approved or cleared by the [place] FDA and  has been authorized for detection and/or diagnosis of SARS-CoV-2 by FDA under an Emergency Use Authorization (EUA). This EUA will remain  in effect (meaning this test can be used) for the duration of the COVID-19 declaration under Section 56 4(b)(1) of the Act, 21  U.S.C. section 360bbb-3(b)(1), unless the authorization is terminated or revoked sooner. Performed at [facility], [place] [address], [place], [place] [address]          Radiology Studies: No results found.      Scheduled Meds: . allopurinol  300 mg Oral Daily  . aspirin EC  81 mg Oral Daily  . atorvastatin  80 mg Oral q1800  . busPIRone  7.5 mg Oral BID  . enoxaparin (LOVENOX) injection  40 mg Subcutaneous Q24H  . feeding supplement (PRO-STAT SUGAR FREE 64)  30 mL Oral BID  . furosemide  40 mg Intravenous Q12H  . gabapentin  600 mg Oral TID  . insulin aspart  0-15 Units Subcutaneous TID WC  . insulin aspart  0-5 Units Subcutaneous QHS  . insulin detemir  40 Units Subcutaneous QHS  . levothyroxine  200 mcg Oral Q0600  . pantoprazole  40 mg Oral BID  . polyethylene glycol  17 g Oral BID  . Ensure Max Protein  11 oz Oral Daily  . senna  1 tablet Oral Daily  . silver sulfADIAZINE  1 application Topical BID  . sucralfate  1 g Oral TID WC & HS  . topiramate  75 mg Oral QHS  . umeclidinium-vilanterol  1 puff Inhalation Daily   Continuous Infusions: .  ceFAZolin (ANCEF) IV Stopped (04/13/19 0516)     LOS: 3 days    Time spent: 35 mins.More than 50% of that time was spent in counseling and/or coordination of care.      Shelly Coss, MD [facility] Pager [phone]  If 7PM-7AM, please contact night-coverage [email or web address] Password [facility] 04/13/2019, 11:55 AM

## 2019-04-13 NOTE — Plan of Care (Signed)
  Problem: Education: Goal: Knowledge of General Education information will improve Description: Including pain rating scale, medication(s)/side effects and non-pharmacologic comfort measures Outcome: Progressing   Problem: Health Behavior/Discharge Planning: Goal: Ability to manage health-related needs will improve Outcome: Progressing   Problem: Clinical Measurements: Goal: Ability to maintain clinical measurements within normal limits will improve Outcome: Progressing Goal: Will remain free from infection Outcome: Progressing Goal: Diagnostic test results will improve Outcome: Progressing Goal: Respiratory complications will improve Outcome: Progressing Goal: Cardiovascular complication will be avoided Outcome: Progressing   Problem: Activity: Goal: Risk for activity intolerance will decrease Outcome: Progressing   Problem: Nutrition: Goal: Adequate nutrition will be maintained Outcome: Progressing   Problem: Coping: Goal: Level of anxiety will decrease Outcome: Progressing-keeping patient door open and frequent verbal contact help decrease anxiety   Problem: Elimination: Goal: Will not experience complications related to bowel motility Outcome: Progressing Goal: Will not experience complications related to urinary retention Outcome: Progressing   Problem: Pain Managment: Goal: General experience of comfort will improve Outcome: Progressing   Problem: Safety: Goal: Ability to remain free from injury will improve Outcome: Progressing   Problem: Skin Integrity: Goal: Risk for impaired skin integrity will decrease Outcome: Progressing

## 2019-04-13 NOTE — Progress Notes (Signed)
Patient has refused pro-stat as well as Ensure Max protein this shift.

## 2019-04-13 NOTE — Progress Notes (Signed)
Inpatient Diabetes Program Recommendations  AACE/ADA: New Consensus Statement on Inpatient Glycemic Control (2015)  Target Ranges:  Prepandial:   less than 140 mg/dL      Peak postprandial:   less than 180 mg/dL (1-2 hours)      Critically ill patients:  140 - 180 mg/dL   Lab Results  Component Value Date   GLUCAP 332 (H) 04/13/2019   HGBA1C 8.4 (H) 04/11/2019    Review of Glycemic Control  Diabetes history: Dm2 Outpatient Diabetes medications: Levemir 40 units QHS, Novolog 1-18 units tidwc Current orders for Inpatient glycemic control: Levemir 40 units QHS, Novolog 0-15 units tidwc and 0-5 units QHS  HgbA1C - 8.4%  Inpatient Diabetes Program Recommendations:     Increase Levemir to 50 units QHS Increase Novolog to 0-20 units tidwc and 0-5 units QHS  Will continue to follow closely.  Thank you. Lorenda Peck, RD, LDN, CDE Inpatient Diabetes Coordinator [phone]

## 2019-04-13 NOTE — Care Management Important Message (Signed)
Important Message  Patient Details IM Letter given to Dessa Phi RN to present to the Patient Name: Stephanie Combs MRN: [id number] Date of Birth: [birth year]   Medicare Important Message Given:  Yes     Kerin Salen 04/13/2019, 10:50 AM

## 2019-04-13 NOTE — Progress Notes (Signed)
Initial Nutrition Assessment  RD working remotely.   DOCUMENTATION CODES:   Morbid obesity  INTERVENTION:  - will order Ensure Max once/day, each supplement provides 150 kcal and 30 grams protein. - will order 30 mL Prostat BID, each supplement provides 100 kcal and 15 grams of protein.   NUTRITION DIAGNOSIS:   Increased nutrient needs related to wound healing, acute illness as evidenced by estimated needs.  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 She presented to the ED with BLE swelling and redness and constipation. She was recently admitted at [facility] for metabolic encephalopathy. At home she was not able to ambulate due to accumulation of fluid in her lungs. Patient was admitted for the management of BLE cellulitis, edema, and started on IV Lasix and antibiotics.  Patient has been eating 75-100% of meals since admission. Will order supplements to increase protein and micronutrient provision d/t wound. Per chart review, current weight is 273 lb. Weight had been stable (280-283 lb) from August-October. Will continue to monitor weight trends. Flow sheet documentation indicates moderate edema to BLE.   Per notes: - fluid overload/BLE edema--IV lasix ordered - BLE cellulitis - constipation - stage 3 CKD - type 2 DM - small cell lung cancer (L lung) with brain mets - debility/deconditioning--PT recommends Home Health    Labs reviewed; CBG: 332 mg/dl, Na: 134 mmol/l, Cl: 93 mmol/l, BUN: 43 mg/d, creatinine: 1.61 mg/dl, Ca: 8.4 mg/dl, GFR: 32 ml/min. Medications reviewed; 40 mg IV lasix BID, sliding scale novolog, 40 units levemir/day, 200 mcg oral  synthroid/day, 40 mg oral protonix BID, 1 packet miralax BID, 1 tablet senokot/day, 1 g carafate TID.    NUTRITION - FOCUSED PHYSICAL EXAM:  unable to complete at this time.   Diet Order:   Diet Order            Diet heart healthy/carb modified Room service appropriate? Yes; Fluid consistency: Thin  Diet effective now              EDUCATION NEEDS:   No education needs have been identified at this time  Skin:  Skin Assessment: Skin Integrity Issues: Skin Integrity Issues:: Stage II Stage II: perineum  Last BM:  11/29  Height:   Ht Readings from Last 1 Encounters:  04/10/19 5\' 6"  (1.676 m)    Weight:   Wt Readings from Last 1 Encounters:  04/13/19 124 kg    Ideal Body Weight:  59.1 kg  BMI:  Body mass index is 44.12 kg/m.  Estimated Nutritional Needs:   Kcal:  1860-2110 kcal  Protein:  100-110 grams  Fluid:  >/= 1.5 L/day     Jarome Matin, MS, RD, LDN, [facility] Inpatient Clinical Dietitian Pager # [phone] After hours/weekend pager # [phone]

## 2019-04-14 ENCOUNTER — Other Ambulatory Visit: Payer: Self-pay

## 2019-04-14 ENCOUNTER — Other Ambulatory Visit: Payer: Medicare Other

## 2019-04-14 LAB — CBC WITH DIFFERENTIAL/PLATELET
Abs Immature Granulocytes: 0.04 10*3/uL (ref 0.00–0.07)
Basophils Absolute: 0.1 10*3/uL (ref 0.0–0.1)
Basophils Relative: 1 %
Eosinophils Absolute: 0.2 10*3/uL (ref 0.0–0.5)
Eosinophils Relative: 3 %
HCT: 25.7 % — ABNORMAL LOW (ref 36.0–46.0)
Hemoglobin: 8 g/dL — ABNORMAL LOW (ref 12.0–15.0)
Immature Granulocytes: 1 %
Lymphocytes Relative: 11 %
Lymphs Abs: 0.7 10*3/uL (ref 0.7–4.0)
MCH: 30.7 pg (ref 26.0–34.0)
MCHC: 31.1 g/dL (ref 30.0–36.0)
MCV: 98.5 fL (ref 80.0–100.0)
Monocytes Absolute: 0.6 10*3/uL (ref 0.1–1.0)
Monocytes Relative: 9 %
Neutro Abs: 5.1 10*3/uL (ref 1.7–7.7)
Neutrophils Relative %: 75 %
Platelets: 175 10*3/uL (ref 150–400)
RBC: 2.61 MIL/uL — ABNORMAL LOW (ref 3.87–5.11)
RDW: 14.2 % (ref 11.5–15.5)
WBC: 6.7 10*3/uL (ref 4.0–10.5)
nRBC: 0 % (ref 0.0–0.2)

## 2019-04-14 LAB — BASIC METABOLIC PANEL
Anion gap: 12 (ref 5–15)
BUN: 52 mg/dL — ABNORMAL HIGH (ref 8–23)
CO2: 32 mmol/L (ref 22–32)
Calcium: 8.7 mg/dL — ABNORMAL LOW (ref 8.9–10.3)
Chloride: 88 mmol/L — ABNORMAL LOW (ref 98–111)
Creatinine, Ser: 1.42 mg/dL — ABNORMAL HIGH (ref 0.44–1.00)
GFR calc Af Amer: 44 mL/min — ABNORMAL LOW (ref >60)
GFR calc non Af Amer: 38 mL/min — ABNORMAL LOW (ref >60)
Glucose, Bld: 331 mg/dL — ABNORMAL HIGH (ref 70–99)
Potassium: 4.1 mmol/L (ref 3.5–5.1)
Sodium: 132 mmol/L — ABNORMAL LOW (ref 135–145)

## 2019-04-14 LAB — GLUCOSE, CAPILLARY
Glucose-Capillary: 321 mg/dL — ABNORMAL HIGH (ref 70–99)
Glucose-Capillary: 339 mg/dL — ABNORMAL HIGH (ref 70–99)
Glucose-Capillary: 290 mg/dL — ABNORMAL HIGH (ref 70–99)
Glucose-Capillary: 279 mg/dL — ABNORMAL HIGH (ref 70–99)

## 2019-04-14 MED ORDER — POLYETHYLENE GLYCOL 3350 17 G PO PACK: 17.0000 g | PACK | Freq: Two times a day (BID) | ORAL | 0 refills | Status: AC

## 2019-04-14 MED ORDER — DOXYCYCLINE MONOHYDRATE 100 MG PO TABS: 100.0000 mg | ORAL_TABLET | Freq: Two times a day (BID) | ORAL | 0 refills | Status: DC

## 2019-04-14 MED ORDER — FUROSEMIDE 40 MG PO TABS
40.0000 mg | ORAL_TABLET | Freq: Two times a day (BID) | ORAL | Status: DC
  Administered 2019-04-15: 08:00:00 40 mg via ORAL
  Filled 2019-04-14: qty 1

## 2019-04-14 MED ORDER — SENNA 8.6 MG PO TABS: 1.0000 | ORAL_TABLET | Freq: Every day | ORAL | 0 refills | Status: DC

## 2019-04-14 NOTE — Patient Outreach (Addendum)
[place] [facility]) Care Management  04/14/2019  Stephanie Combs [birth year] [id number]   Social work referral received from [place], [place], regarding meal assistance for patient upon hospital discharge.   Can submit referral to [employer] program but unable to do so until patient discharges.  In-basket message sent to [facility] to inquire about expected discharge date.  Addendum: Attempted to contact patient as it appears she is discharging today.  Left voicemail message.  Will attempt to reach her again tomorrow.    [name], BSW Social Worker [phone]

## 2019-04-14 NOTE — TOC Transition Note (Signed)
Transition of Care [facility]) - CM/SW Discharge Note   Patient Details  Name: Stephanie Combs MRN: [id number] Date of Birth: 11-02-1950  [facility] CM/SW Contact:  Dessa Phi, RN Phone Number: 04/14/2019, 10:48 AM   Clinical Narrative:   D/c home w/HHC, already has home 02 & travel tank. No further CM needs.    Final next level of care: [place] Barriers to Discharge: No Barriers Identified   Patient Goals and CMS Choice        Discharge Placement                       Discharge Plan and Services   Discharge Planning Services: CM Consult                      HH Arranged: PT HH Agency: [place] Date [place]: 04/13/19 Time HH Agency Contacted: 36 Representative spoke with at [place]: [place] Determinants of Health ([place]) Interventions     Readmission Risk Interventions Readmission Risk Prevention Plan 04/11/2019 10/13/2018 09/02/2018  Transportation Screening Complete Complete -  Medication Review Press photographer) Complete Complete -  PCP or Specialist appointment within 3-5 days of discharge Complete (No Data) Complete  PCP/Specialist Appt Not Complete comments - Recent admit; remains acutely ill -  HRI or Home Care Consult Complete Complete -  SW Recovery Care/Counseling Consult Not Complete Complete -  SW Consult Not Complete Comments not appropriate - -  Palliative Care Screening Not Applicable Not Applicable -  [place] Not Applicable Not Applicable -  Some recent data might be hidden

## 2019-04-14 NOTE — Progress Notes (Signed)
Stephanie Solo, MD notified this charge nurse regarding why this pt was not d/c. Per report pt, was lethargic and unsafe to be d/c home alone. This RN was not able to speak with the day shift nurse regarding this and there was no note placed in the chart. This RN went to pt room and spoke with pt. Pt was alert and oriented x4 and explained how her daughter had to pick up her husband from another hospital earlier today. Pt was adamant she will be "discharged in the morning before lunchtime". Pt was very firm with this and told this RN "that is how this will go". This RN tried to get in touch with pt daughter, Stephanie Combs, and left a voicemail for her to call back. No response yet. Will try again later to call daughter.

## 2019-04-14 NOTE — Progress Notes (Signed)
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  She was recently admitted at [place] for metabolic encephalopathy    PT Comments    Pt requesting up to [facility] upon PT arrival to room. Pt requires min-mod +2 for bed mobility, transfer out of bed, and very short distance ambulation at this time. Pt requires increased time to mobilize and has very decreased activity tolerance. Pt states her daughter is able to assist her at home with mobility as needed, and has a ramp and wheelchair for longer distances (I.e. from car to house). Pt plans to d/c home today.    Follow Up Recommendations  Home health PT;Supervision/Assistance - 24 hour(pt states she will only d/c home)     Equipment Recommendations  None recommended by PT    Recommendations for Other Services       Precautions / Restrictions Precautions Precautions: Fall Precaution Comments: 2.5 L O2 baseline Restrictions Weight Bearing Restrictions: No    Mobility  Bed Mobility Overal bed mobility: Needs Assistance Bed Mobility: Supine to Sit     Supine to sit: HOB elevated;Min assist     General bed mobility comments: min assist for trunk elevation, increased time to scoot to EOB with light assist with bed pads to perform.  Transfers Overall transfer level: Needs assistance Equipment used: Rolling walker (2 wheeled) Transfers: Sit to/from [facility] Sit to Stand: Mod assist;+2 safety/equipment;+2 physical assistance;From elevated surface Stand pivot transfers: Min assist       General transfer comment: Mod assist +2 for power up, trunk elevation, and steadying upon  standing. Min assist for stand pivot to [facility] for steadying, guiding pt onto [facility]. Sit to stand x2, once from bed and once from [facility].  Ambulation/Gait Ambulation/Gait assistance: Min assist;+2 safety/equipment Gait Distance (Feet): 5 Feet Assistive device: Rolling walker (2 wheeled) Gait Pattern/deviations: Step-through pattern;Decreased stride length;Shuffle;Trunk flexed Gait velocity: decr   General Gait Details: Min assist for steadying, guiding pt to recliner from [facility]. Pt fatigues quickly and required PT to pull recliner up to her due to fatiguing prior to reaching recliner.   Stairs             Wheelchair Mobility    Modified Rankin (Stroke Patients Only)       Balance Overall balance assessment: Needs assistance   Sitting balance-Leahy Scale: Fair     Standing balance support: Bilateral upper extremity supported Standing balance-Leahy Scale: Poor Standing balance comment: reliant on UE support                            Cognition Arousal/Alertness: Awake/alert Behavior During Therapy: WFL for tasks assessed/performed Overall Cognitive Status: Within Functional Limits for tasks assessed                                        Exercises General Exercises - Lower Extremity Long Arc Quad: AROM;Both;15 reps;Seated    General Comments        Pertinent Vitals/Pain Pain Assessment: Faces Faces Pain Scale: Hurts little more Pain Location: "everywhere" Pain Descriptors / Indicators:  Sore Pain Intervention(s): Limited activity within patient's tolerance;Monitored during session;Repositioned    Home Living                      Prior Function            PT Goals (current goals can now be found in the care plan section) Acute Rehab PT Goals PT Goal Formulation: With patient Time For Goal Achievement: 04/26/19 Potential to Achieve Goals: Good Progress towards PT goals: Progressing toward goals    Frequency    Min  3X/week      PT Plan Current plan remains appropriate    Co-evaluation              AM-PAC PT "6 Clicks" Mobility   Outcome Measure  Help needed turning from your back to your side while in a flat bed without using bedrails?: A Little Help needed moving from lying on your back to sitting on the side of a flat bed without using bedrails?: A Little Help needed moving to and from a bed to a chair (including a wheelchair)?: A Lot Help needed standing up from a chair using your arms (e.g., wheelchair or bedside chair)?: A Lot Help needed to walk in hospital room?: A Lot Help needed climbing 3-5 steps with a railing? : A Lot 6 Click Score: 14    End of Session Equipment Utilized During Treatment: Gait belt;Oxygen Activity Tolerance: Patient tolerated treatment well Patient left: in chair;with call bell/phone within reach;with chair alarm set Nurse Communication: Mobility status PT Visit Diagnosis: Difficulty in walking, not elsewhere classified (R26.2);Muscle weakness (generalized) (M62.81)     Time: 2505-3976 PT Time Calculation (min) (ACUTE ONLY): 32 min  Charges:  $Therapeutic Activity: 23-37 mins                     Tashaya Ancrum E, PT Acute Rehabilitation Services Pager [phone]  Office [phone]   Agam Davenport D Fynley Chrystal 04/14/2019, 12:07 PM

## 2019-04-14 NOTE — Discharge Summary (Addendum)
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  She was recently admitted at [facility] for metabolic encephalopathy.  At home she was not able to ambulate due to accumulation of fluid in her lungs.  Patient was admitted for the management of bilateral lower extremity cellulitis, edema and started on IV Lasix and antibiotics.  Also had constipation on presentation.  Her bilateral lower extremity edema and cellulitis gradually improved with IV antibiotics and IV Lasix.  She had good diuresis during this hospitalization.  She was also started on aggressive bowel regimen.  She was seen by physical therapy and recommended home health on discharge.  She is hemodynamically stable for discharge to home today.  Following problems were addressed during her hospitalization:  Fluid overload/bilateral lower extremity edema: She has history of diastolic CHF.  Echocardiogram done on 2/20 showed normal ejection fraction, impaired relaxation.  She has been started on IV Lasix with  good urine output.    Bilateral lower extremity edema significantly improved.  Continue Lasix at home regimen, 60 mg in the morning and 40 mg at night.  Continue potassium supplementation. Echocardiogram showed ejection fraction more than 32%, grade 1 diastolic dysfunction.  Bilateral lower extremity cellulitis: Most likely aggravated by lower extremity edema.  She was on cefazolin here.  Will change antibiotics to doxycycline on   discharge.  Constipation: Continue aggressive bowel regimen.  CKD stage IIIb: Baseline creatinine between 1.7-1.9.  Currently kidney function at baseline.  Diabetes type 2: Continue current insulin regimen.  Continue to monitor CBGs.  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 Burr Medico.  Status post concurrent chemoradiation for small cell lung cancer, status post whole brain radiation.  She is not a surgical candidate  for colon cancer.  She needs to follow-up with oncology as an outpatient.  Coronary artery disease: Currently stable.  Denies any chest pain.  Continue current medicines  COPD: Currently stable.  Not on exacerbation.  Continue oxygen, bronchodilators.  On oxygen at 2 to 3 L/min at home.  Hypothyroidism: Continue Synthyroid  Morbid obesity: BMI of 44.6.  Deconditioning/debility:  PT evaluation done and recommended home health.   Discharge Diagnoses:  Principal Problem:   Fluid overload Active Problems:   COPD (chronic obstructive pulmonary disease) (HCC)   Morbid obesity (HCC)   DM type 2 (diabetes mellitus, type 2) (HCC)   Hypothyroidism   Acute renal failure superimposed on stage 3 chronic kidney disease (HCC)   Chronic respiratory failure with hypoxia (HCC)   CAD (coronary artery disease)   Chronic pain   Opioid dependence ([place])   Small cell lung cancer, left upper lobe ([place])   Brain metastasis ([place])   Lung cancer ([place])   Pressure injury of skin    Discharge Instructions  Discharge Instructions    AMB Referral to [facility] Care Management   Complete by: As directed    Please refer to social worker. Member requests assistance with meals for a couple of weeks when she returns home.  Netta Cedars, MSN, [place]  Hospital Liaison Nurse Mobile Phone [phone]  Toll free office [phone]   Reason for consult: Post hospital follow up   Diagnoses of:  Cancer with Mets COPD/  Pneumonia     Expected date of contact: 1-3 days (reserved for hospital discharges)   Diet - low sodium heart healthy   Complete by: As directed    Discharge instructions   Complete by: As directed    1)Please take prescribed medications as instructed. 2)Follow up with your PCP in a week.  Do a BMP test during the follow-up. 3)Follow up with your oncologist as an outpatient.   Increase activity slowly   Complete by: As directed      Allergies as of 04/15/2019      Reactions   Dilaudid [hydromorphone Hcl] Itching   Midodrine Hcl Swelling   After one dose had anaphylactic reaction, had to call EMS.   Actifed Cold-allergy [chlorpheniramine-phenylephrine]    "I was sick and red and it didn't agree with me at all"   Doxycycline Nausea And Vomiting   Other Cough   Pt states she is allergic to ragweed and that she starts coughing and sneezing like crazy   Penicillins Hives, Itching   Tolerates Rocephin Has patient had a PCN reaction causing immediate rash, facial/tongue/throat swelling, SOB or lightheadedness with hypotension: Yes Has patient had a PCN reaction causing severe rash involving mucus membranes or skin necrosis: No Has patient had a PCN reaction that required hospitalization Yes Has patient had a PCN reaction occurring within the last 10 years: No If all of the above answers are "NO", then may proceed with Cephalosporin use.   Reglan [metoclopramide] Itching   Valium [diazepam] Itching   Vistaril [hydroxyzine Hcl] Itching      Medication List    STOP taking these medications   acetaminophen-codeine 300-30 MG tablet Commonly known as: TYLENOL #3   prochlorperazine 10 MG tablet Commonly known as: COMPAZINE   tiZANidine 4 MG tablet Commonly known as: ZANAFLEX     TAKE these medications   albuterol (2.5 MG/3ML) 0.083% nebulizer solution Commonly known as: PROVENTIL Take 2.5 mg by nebulization 2 (two) times daily as needed for wheezing or shortness of breath.    albuterol 108 (90 Base) MCG/ACT inhaler Commonly known as: ProAir HFA Inhale 2 puffs into the lungs every 4 (four) hours as needed for wheezing or shortness of breath. For shortness of breath   allopurinol 300 MG tablet Commonly known as: ZYLOPRIM Take 300 mg by mouth daily.   ALPRAZolam 1 MG tablet Commonly known as: XANAX Take 1 mg by mouth 3 (three) times daily as needed for anxiety or sleep.   aspirin EC 81 MG tablet Take 81 mg by mouth daily.   atorvastatin 80 MG tablet Commonly known as: LIPITOR Take 1 tablet (80 mg total) by mouth daily at 6 PM.   busPIRone 7.5 MG tablet Commonly known as: BUSPAR Take 7.5 mg by mouth 2 (two) times daily.   cephALEXin 500 MG capsule Commonly known as: KEFLEX Take 1 capsule (500 mg total) by mouth 3 (three) times daily for 4 days.   cholecalciferol 25 MCG (1000 UT) tablet Commonly known as: VITAMIN D3 Take 5,000 Units by mouth daily.   diphenhydrAMINE-zinc acetate cream Commonly known as: BENADRYL Apply 1 application topically 3 (three) times daily as needed for itching.   diphenoxylate-atropine 2.5-0.025 MG tablet Commonly known as: LOMOTIL Take 1 tablet by mouth 4 (four) times daily as needed for diarrhea or loose stools.  FISH OIL PO Take 1 capsule by mouth daily.   furosemide 40 MG tablet Commonly known as: LASIX Take 60 mg (1 1/2 tabs) in the morning and 40 mg (1 tab) in the afternoon What changed:   how much to take  how to take this  when to take this  additional instructions   gabapentin 600 MG tablet Commonly known as: NEURONTIN Take 600 mg by mouth 3 (three) times daily.   Gerhardt's butt cream Crea Apply 1 application topically 4 (four) times daily as needed for irritation.   hydrocortisone 2.5 % rectal cream Commonly known as: ANUSOL-HC Place 1 application rectally 4 (four) times daily as needed for hemorrhoids.   insulin aspart 100 UNIT/ML injection Commonly known as: novoLOG Inject 1-18 Units  into the skin 3 (three) times daily with meals.   insulin detemir 100 UNIT/ML injection Commonly known as: Levemir Inject 0.4 mLs (40 Units total) into the skin at bedtime.   insulin lispro 100 UNIT/ML injection Commonly known as: HUMALOG Inject 0-20 Units into the skin 3 (three) times daily with meals.   levothyroxine 200 MCG tablet Commonly known as: SYNTHROID Take 1 tablet by mouth daily before breakfast.   nitroGLYCERIN 0.4 MG SL tablet Commonly known as: NITROSTAT Place 0.4 mg under the tongue every 5 (five) minutes as needed for chest pain.   nystatin powder Commonly known as: MYCOSTATIN/NYSTOP Apply topically 2 (two) times daily.   ondansetron 8 MG disintegrating tablet Commonly known as: ZOFRAN-ODT TAKE 1 TABLET BY MOUTH EVERY 8 HOURS AS NEEDED FOR NAUSEA OR VOMITING.   ondansetron 8 MG tablet Commonly known as: ZOFRAN Take 1 tablet (8 mg total) by mouth every 8 (eight) hours as needed for nausea or vomiting.   oxyCODONE 5 MG immediate release tablet Commonly known as: Oxy IR/ROXICODONE Take 1 tablet (5 mg total) by mouth every 12 (twelve) hours as needed for severe pain.   oxyCODONE-acetaminophen 10-325 MG tablet Commonly known as: PERCOCET Take 1 tablet by mouth every 6 (six) hours as needed for pain.   OXYGEN Inhale 2 L into the lungs continuous.   pantoprazole 40 MG tablet Commonly known as: PROTONIX Take 1 tablet (40 mg total) by mouth 2 (two) times daily.   polyethylene glycol 17 g packet Commonly known as: MIRALAX / GLYCOLAX Take 17 g by mouth 2 (two) times daily.   potassium chloride SA 20 MEQ tablet Commonly known as: KLOR-CON Take 1 tablet (20 mEq total) by mouth 2 (two) times daily.   promethazine 12.5 MG tablet Commonly known as: PHENERGAN Take 1 tablet (12.5 mg total) by mouth every 6 (six) hours as needed for nausea or vomiting.   senna 8.6 MG Tabs tablet Commonly known as: SENOKOT Take 1 tablet (8.6 mg total) by mouth daily.   silver  sulfADIAZINE 1 % cream Commonly known as: SILVADENE Apply 1 application topically 2 (two) times daily.   sucralfate 1 g tablet Commonly known as: Carafate Take 1 tablet (1 g total) by mouth 4 (four) times daily -  with meals and at bedtime. 5 min before meals for radiation induced esophagitis   SURE COMFORT INS SYR 1CC/28G 28G X 1/2" 1 ML Misc Generic drug: INS SYRINGE/NEEDLE 1CC/28G USE TO INJECT INSULIN UP TO 4 TIMES DAILY.   topiramate 25 MG tablet Commonly known as: TOPAMAX Take 75 mg by mouth at bedtime.   umeclidinium-vilanterol 62.5-25 MCG/INH Aepb Commonly known as: ANORO ELLIPTA Inhale 1 puff into the lungs daily.  Follow-up Information    Care, [facility] Follow up.   Specialty: [place] Why: [facility] physical therapy Contact information: [place] [address] [phone]        [name], MD. Schedule an appointment as soon as possible for a visit in 1 week(s).   Specialty: Internal Medicine Contact information: [address] [place] [address] [phone]          Allergies  Allergen Reactions  . Dilaudid [Hydromorphone Hcl] Itching  . Midodrine Hcl Swelling    After one dose had anaphylactic reaction, had to call EMS.  Marland Kitchen Actifed Cold-Allergy [Chlorpheniramine-Phenylephrine]     "I was sick and red and it didn't agree with me at all"  . Doxycycline Nausea And Vomiting  . Other Cough    Pt states she is allergic to ragweed and that she starts coughing and sneezing like crazy  . Penicillins Hives and Itching    Tolerates Rocephin Has patient had a PCN reaction causing immediate rash, facial/tongue/throat swelling, SOB or lightheadedness with hypotension: Yes Has patient had a PCN reaction causing severe rash involving mucus membranes or skin necrosis: No Has patient had a PCN reaction that required hospitalization Yes Has patient had a PCN reaction occurring within the last 10  years: No If all of the above answers are "NO", then may proceed with Cephalosporin use.   . Reglan [Metoclopramide] Itching  . Valium [Diazepam] Itching  . Vistaril [Hydroxyzine Hcl] Itching    Consultations:  Oncology   Procedures/Studies: Ct Head Wo Contrast  Result Date: 03/31/2019 CLINICAL DATA:  Patient with altered mental status.  Colon cancer. EXAM: CT HEAD WITHOUT CONTRAST TECHNIQUE: Contiguous axial images were obtained from the base of the skull through the vertex without intravenous contrast. COMPARISON:  Brain CT 01/06/2018. FINDINGS: Brain: Ventricles and sulci are prominent compatible with atrophy. Periventricular and subcortical white matter hypodensities compatible with chronic microvascular ischemic changes. No evidence for acute cortically based infarct, intracranial hemorrhage, mass lesion or mass-effect. Vascular: Unremarkable Skull: Intact. Sinuses/Orbits: Paranasal sinuses are well aerated. Mastoid air cells are unremarkable. Orbits are unremarkable. Other: None. IMPRESSION: No acute intracranial process. Atrophy and chronic microvascular ischemic changes. Electronically Signed   By: Lovey Newcomer M.D.   On: 03/31/2019 18:28   Mr Brain Wo Contrast  Result Date: 04/01/2019 CLINICAL DATA:  Altered level of consciousness (LOC), unexplained altered mental status. EXAM: MRI HEAD WITHOUT CONTRAST TECHNIQUE: Multiplanar, multiecho pulse sequences of the brain and surrounding structures were obtained without intravenous contrast. COMPARISON:  Head CT 03/31/2019, brain MRI 03/03/2019. FINDINGS: Brain: There is mild to moderate motion degradation of multiple sequences, somewhat limiting evaluation. There is no convincing evidence of acute infarct. No midline shift or extra-axial fluid collection. A previously demonstrated subcentimeter hemorrhagic metastasis within the left posterior cerebellum, and 2 additional subcentimeter metastatic lesions within the right temporal lobe, were  better appreciated on prior contrast-enhanced MRI brain 03/03/2019. Persistent small amount of vasogenic edema surrounding the temporal lobe lesions. Stable generalized parenchymal atrophy and chronic small vessel ischemic disease. Vascular: Flow voids maintained within the proximal large arterial vessels. Skull and upper cervical spine: No focal marrow lesion Sinuses/Orbits: Visualized orbits demonstrate no acute abnormality. Right mastoid effusion. IMPRESSION: 1. Motion degraded examination. 2. No evidence of acute infarct. 3. Subcentimeter metastatic lesions within the left cerebellum and right temporal lobe (3 total) were better appreciated on prior contrast-enhanced brain MRI 03/03/2019. 4. Stable generalized parenchymal atrophy and chronic small  vessel ischemic disease. 5. Right mastoid effusion. Electronically Signed   By: Kellie Simmering DO   On: 04/01/2019 07:59   Dg Chest Portable 1 View  Result Date: 04/10/2019 CLINICAL DATA:  CHF EXAM: PORTABLE CHEST 1 VIEW COMPARISON:  Radiograph 03/31/2019 FINDINGS: Increasing hazy interstitial opacities in the lung bases with mild cardiomegaly and indistinct pulmonary vascularity with few septal lines. Some atelectatic opacity is noted in the right lung base as well. No pneumothorax. No visible effusion. No acute osseous or soft tissue abnormality. IMPRESSION: Features suggest mild developing interstitial edema. Mild cardiomegaly. Electronically Signed   By: Lovena Le M.D.   On: 04/10/2019 00:11   Dg Chest Port 1 View  Result Date: 03/31/2019 CLINICAL DATA:  Altered mental status EXAM: PORTABLE CHEST 1 VIEW COMPARISON:  September 2020 FINDINGS: Right lung is clear. Left hilar mass is not well evaluated. Patchy increased density in the left upper lung. No pleural effusion. No pneumothorax. Normal heart size for portable technique. IMPRESSION: Patchy density in the left upper lung that may reflect atelectasis/consolidation, potentially postobstructive given  hilar mass on prior imaging. Electronically Signed   By: Macy Mis M.D.   On: 03/31/2019 17:04      Subjective: Patient seen and examined at the bedside this morning.  Hemodynamically stable for discharge today  Discharge Exam: Vitals:   04/14/19 2132 04/15/19 0512  BP: (!) 137/49 129/65  Pulse: 73 87  Resp: 20 18  Temp: 98.5 F (36.9 C) 98.9 F (37.2 C)  SpO2: 99% 99%   Vitals:   04/14/19 1806 04/14/19 2132 04/15/19 0512 04/15/19 0514  BP: (!) 122/50 (!) 137/49 129/65   Pulse: 71 73 87   Resp: 16 20 18    Temp:  98.5 F (36.9 C) 98.9 F (37.2 C)   TempSrc:   Oral   SpO2: 100% 99% 99%   Weight:    125.5 kg  Height:        General: Pt is alert, awake, not in acute distress.  Obese Cardiovascular: RRR, S1/S2 +, no rubs, no gallops Respiratory: CTA bilaterally, no wheezing, no rhonchi Abdominal: Soft, NT, ND, bowel sounds + Extremities: Bilateral lower extremity edema, no cyanosis    The results of significant diagnostics from this hospitalization (including imaging, microbiology, ancillary and laboratory) are listed below for reference.     Microbiology: Recent Results (from the past 240 hour(s))  SARS CORONAVIRUS 2 (TAT 6-24 HRS) Nasopharyngeal Nasopharyngeal Swab     Status: None   Collection Time: 04/10/19  3:15 AM   Specimen: Nasopharyngeal Swab  Result Value Ref Range Status   SARS Coronavirus 2 NEGATIVE NEGATIVE Final    Comment: (NOTE) SARS-CoV-2 target nucleic acids are NOT DETECTED. The SARS-CoV-2 RNA is generally detectable in upper and lower respiratory specimens during the acute phase of infection. Negative results do not preclude SARS-CoV-2 infection, do not rule out co-infections with other pathogens, and should not be used as the sole basis for treatment or other patient management decisions. Negative results must be combined with clinical observations, patient history, and epidemiological information. The expected result is Negative. Fact  Sheet for Patients: SugarRoll.be Fact Sheet for Healthcare Providers: https://www.woods-mathews.com/ This test is not yet approved or cleared by the [place] FDA and  has been authorized for detection and/or diagnosis of SARS-CoV-2 by FDA under an Emergency Use Authorization (EUA). This EUA will remain  in effect (meaning this test can be used) for the duration of the COVID-19 declaration under Section 56 4(b)(1) of  the Act, 21 U.S.C. section 360bbb-3(b)(1), unless the authorization is terminated or revoked sooner. Performed at [facility], [place] [address] [place], [place] [address]      Labs: BNP (last 3 results) Recent Labs    10/12/18 1220 10/13/18 0538 04/10/19 0018  BNP 535.6* 473.4* 24.0   Basic Metabolic Panel: Recent Labs  Lab 04/10/19 0018 04/11/19 0459 04/12/19 0435 04/13/19 0457 04/14/19 0448  NA 136  --  135 134* 132*  K 3.9  --  4.0 4.3 4.1  CL 101  --  95* 93* 88*  CO2 24  --  28 31 32  GLUCOSE 261*  --  400* 405* 331*  BUN 50*  --  42* 43* 52*  CREATININE 2.22* 1.77* 1.64* 1.61* 1.42*  CALCIUM 9.2  --  8.6* 8.4* 8.7*   Liver Function Tests: Recent Labs  Lab 04/10/19 0018  AST 19  ALT 12  ALKPHOS 87  BILITOT 0.5  PROT 6.8  ALBUMIN 3.6   No results for input(s): LIPASE, AMYLASE in the last 168 hours. No results for input(s): AMMONIA in the last 168 hours. CBC: Recent Labs  Lab 04/10/19 0018 04/11/19 0459 04/14/19 0448  WBC 6.2 5.6 6.7  NEUTROABS 4.7  --  5.1  HGB 9.5* 9.8* 8.0*  HCT 31.1* 31.8* 25.7*  MCV 101.3* 100.6* 98.5  PLT 218 202 175   Cardiac Enzymes: No results for input(s): CKTOTAL, CKMB, CKMBINDEX, TROPONINI in the last 168 hours. BNP: Invalid input(s): POCBNP CBG: Recent Labs  Lab 04/14/19 0821 04/14/19 1213 04/14/19 1646 04/14/19 2129 04/15/19 0755  GLUCAP 321* 339* 290* 279* 223*   D-Dimer No results for input(s): DDIMER in the last 72 hours. Hgb A1c No  results for input(s): HGBA1C in the last 72 hours. Lipid Profile No results for input(s): CHOL, HDL, LDLCALC, TRIG, CHOLHDL, LDLDIRECT in the last 72 hours. Thyroid function studies No results for input(s): TSH, T4TOTAL, T3FREE, THYROIDAB in the last 72 hours.  Invalid input(s): FREET3 Anemia work up No results for input(s): VITAMINB12, FOLATE, FERRITIN, TIBC, IRON, RETICCTPCT in the last 72 hours. Urinalysis    Component Value Date/Time   COLORURINE STRAW (A) 04/10/2019 0018   APPEARANCEUR CLEAR 04/10/2019 0018   LABSPEC 1.010 04/10/2019 0018   PHURINE 5.0 04/10/2019 0018   GLUCOSEU 150 (A) 04/10/2019 0018   HGBUR LARGE (A) 04/10/2019 0018   BILIRUBINUR NEGATIVE 04/10/2019 0018   KETONESUR NEGATIVE 04/10/2019 0018   PROTEINUR NEGATIVE 04/10/2019 0018   UROBILINOGEN 0.2 12/04/2014 0650   NITRITE NEGATIVE 04/10/2019 0018   LEUKOCYTESUR NEGATIVE 04/10/2019 0018   Sepsis Labs Invalid input(s): PROCALCITONIN,  WBC,  LACTICIDVEN Microbiology Recent Results (from the past 240 hour(s))  SARS CORONAVIRUS 2 (TAT 6-24 HRS) Nasopharyngeal Nasopharyngeal Swab     Status: None   Collection Time: 04/10/19  3:15 AM   Specimen: Nasopharyngeal Swab  Result Value Ref Range Status   SARS Coronavirus 2 NEGATIVE NEGATIVE Final    Comment: (NOTE) SARS-CoV-2 target nucleic acids are NOT DETECTED. The SARS-CoV-2 RNA is generally detectable in upper and lower respiratory specimens during the acute phase of infection. Negative results do not preclude SARS-CoV-2 infection, do not rule out co-infections with other pathogens, and should not be used as the sole basis for treatment or other patient management decisions. Negative results must be combined with clinical observations, patient history, and epidemiological information. The expected result is Negative. Fact Sheet for Patients: SugarRoll.be Fact Sheet for Healthcare  Providers: https://www.woods-mathews.com/ This test is not  yet approved or cleared by the [place] and  has been authorized for detection and/or diagnosis of SARS-CoV-2 by FDA under an Emergency Use Authorization (EUA). This EUA will remain  in effect (meaning this test can be used) for the duration of the COVID-19 declaration under Section 56 4(b)(1) of the Act, 21 U.S.C. section 360bbb-3(b)(1), unless the authorization is terminated or revoked sooner. Performed at [facility], [place] [address], [place], [place] [address]     Please note: You were cared for by a hospitalist during your hospital stay. Once you are discharged, your primary care physician will handle any further medical issues. Please note that NO REFILLS for any discharge medications will be authorized once you are discharged, as it is imperative that you return to your primary care physician (or establish a relationship with a primary care physician if you do not have one) for your post hospital discharge needs so that they can reassess your need for medications and monitor your lab values.    Time coordinating discharge: 40 minutes  SIGNED:   Shelly Coss, MD  [facility] 04/15/2019, 10:45 AM Pager [phone]  If 7PM-7AM, please contact night-coverage [email or web address] Password [email or web address]

## 2019-04-14 NOTE — Progress Notes (Signed)
RN spoke with pt again regarding discharge. Pt stated "I am not leaving tonight. I am leaving in the morning before lunch time". Pt still alert and oriented. Daughter has not returned call. Tawanna Solo, MD notified of events. Will update with any changes.

## 2019-04-14 NOTE — Progress Notes (Signed)
I was notified at 6:15 pm by RN  that patient was lethargic so cudnt be discharged . Upon further clarification at around 8pm, I knew that patient was actually fine ,hemodynamically stable and was not willing to get discharged.The daughter also did not pick the phone when called to pick her up. The discharge order is in place. Patient should be discharged as soon as possible in AM.I will discontinue IV lasix and IV antibiotics.

## 2019-04-14 NOTE — Progress Notes (Signed)
Pharmacy Antibiotic Note  Stephanie Combs is a 68 y.o. female admitted on 04/09/2019 with lower extremity cellulitis.  Pharmacy has been consulted for cefazolin dosing.  Pt has PCN allergy documented, but has tolerated cephalosporins in the past. Currently day #4 of cefazolin.  Today, 04/14/19  SCr 1.42, CrCl ~51 mL/min  Afebrile  WBC WNL  Plan:  Continue cefazolin 1 g IV q8h  Follow renal function  Height: 5\' 6"  (167.6 cm) Weight: 282 lb 3 oz (128 kg) IBW/kg (Calculated) : 59.3  Temp (24hrs), Avg:98.6 F (37 C), Min:98.3 F (36.8 C), Max:98.8 F (37.1 C)  Recent Labs  Lab 04/10/19 0018 04/11/19 0459 04/12/19 0435 04/13/19 0457 04/14/19 0448  WBC 6.2 5.6  --   --  6.7  CREATININE 2.22* 1.77* 1.64* 1.61* 1.42*  LATICACIDVEN 1.4  --   --   --   --     Estimated Creatinine Clearance: 51.2 mL/min (A) (by C-G formula based on SCr of 1.42 mg/dL (H)).    Allergies  Allergen Reactions  . Dilaudid [Hydromorphone Hcl] Itching  . Midodrine Hcl Swelling    After one dose had anaphylactic reaction, had to call EMS.  Marland Kitchen Actifed Cold-Allergy [Chlorpheniramine-Phenylephrine]     "I was sick and red and it didn't agree with me at all"  . Doxycycline Nausea And Vomiting  . Other Cough    Pt states she is allergic to ragweed and that she starts coughing and sneezing like crazy  . Penicillins Hives and Itching    Tolerates Rocephin Has patient had a PCN reaction causing immediate rash, facial/tongue/throat swelling, SOB or lightheadedness with hypotension: Yes Has patient had a PCN reaction causing severe rash involving mucus membranes or skin necrosis: No Has patient had a PCN reaction that required hospitalization Yes Has patient had a PCN reaction occurring within the last 10 years: No If all of the above answers are "NO", then may proceed with Cephalosporin use.   . Reglan [Metoclopramide] Itching  . Valium [Diazepam] Itching  . Vistaril [Hydroxyzine Hcl] Itching     Antimicrobials this admission: cefazolin 11/30 >>   Dose adjustments this admission:  Microbiology results: 11/29 SARS-2: Negative  Thank you for allowing pharmacy to be a part of this patient's care.  Lenis Noon, PharmD 04/14/2019 7:14 AM

## 2019-04-15 ENCOUNTER — Ambulatory Visit: Payer: Self-pay

## 2019-04-15 LAB — GLUCOSE, CAPILLARY
Glucose-Capillary: 223 mg/dL — ABNORMAL HIGH (ref 70–99)
Glucose-Capillary: 315 mg/dL — ABNORMAL HIGH (ref 70–99)

## 2019-04-15 MED ORDER — POTASSIUM CHLORIDE CRYS ER 20 MEQ PO TBCR: 20.0000 meq | EXTENDED_RELEASE_TABLET | Freq: Two times a day (BID) | ORAL | 1 refills | Status: DC

## 2019-04-15 MED ORDER — CEPHALEXIN 500 MG PO CAPS: 500.0000 mg | ORAL_CAPSULE | Freq: Three times a day (TID) | ORAL | 0 refills | Status: AC

## 2019-04-15 NOTE — Progress Notes (Addendum)
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 She was recently admitted at [facility] for metabolic encephalopathy. At home she was not able to ambulate due to accumulation of fluid in her lungs. Patient was admitted for the management of bilateral lower extremity cellulitis, edema and started on IV Lasix and antibiotics. Also had constipation on presentation.  Her bilateral lower extremity edema and cellulitis gradually improved with IV antibiotics and IV Lasix.  She had good diuresis during this hospitalization.  She was also started on aggressive bowel regimen.  She was seen by physical therapy and recommended home health on discharge.  She is hemodynamically stable for discharge to home today  Assessment & Plan:   Principal Problem:   Fluid overload Active Problems:   COPD (chronic obstructive pulmonary disease) (HCC)   Morbid obesity (HCC)   DM type 2 (diabetes mellitus, type 2) (HCC)   Hypothyroidism   Acute renal failure superimposed on stage 3 chronic kidney disease (HCC)   Chronic respiratory failure with hypoxia (HCC)   CAD (coronary artery disease)   Chronic pain   Opioid dependence ([place])   Small cell lung cancer, left upper lobe (HCC)   Brain metastasis (HCC)   Lung cancer ([place])   Pressure injury of skin   Fluid overload/bilateral lower extremity edema: She has history of diastolic CHF. Echocardiogram done on 2/20 showed normal ejection fraction, impaired relaxation. She has been started on IV Lasix with good urine output.   Bilateral lower extremity edema significantly improved.  Continue Lasix at home regimen, 60 mg in the morning and 40 mg at night.   Continue potassium supplementation. Echocardiogram showed ejection fraction more than 82%, grade 1 diastolic dysfunction.  Bilateral lower extremity cellulitis: Most likely aggravated by lower extremity edema.  She was on cefazolin here.  Will change antibiotics to oral on  discharge.  Constipation:Continue aggressive bowel regimen.  CKD stage IIIb: Baseline creatinine between 1.7-1.9. Currently kidney function at baseline.  Diabetes type 2:Continue current insulin regimen. Continue to monitor CBGs.  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 Burr Medico.  Status post concurrent chemoradiation for small cell lung cancer, status post whole brain radiation.  She is not a surgical candidate  for colon cancer.  She needs to follow-up with oncology as an outpatient.  Coronary artery disease:Currently stable. Denies any chest pain. Continue current medicines  COPD: Currently stable. Not on exacerbation. Continue oxygen, bronchodilators. On oxygen at 2 to 3 L/min at home.  Hypothyroidism:Continue Synthyroid  Morbid obesity: BMI of 44.6.  Deconditioning/debility:PT evaluation done and recommended home health.   Nutrition Problem: Increased nutrient needs Etiology: wound healing, acute illness      DVT prophylaxis:Lovenox Code Status: Full Family Communication: Called daughter on phone on 04/15/19 Disposition Plan: Home today   Consultants: None  Procedures:None  Antimicrobials:  Anti-infectives (From admission, onward)   Start     Dose/Rate Route Frequency Ordered Stop   04/14/19 0000  doxycycline (ADOXA) 100 MG tablet     100 mg Oral 2 times daily 04/14/19 1038     04/11/19 1400  ceFAZolin (ANCEF) IVPB 1 g/50 mL premix  Status:  Discontinued  1 g 100 mL/hr over 30 Minutes Intravenous Every 8 hours 04/11/19 1305 04/14/19 2053   04/10/19 0445  ceFAZolin (ANCEF) IVPB 1 g/50 mL premix     1 g 100  mL/hr over 30 Minutes Intravenous  Once 04/10/19 0433 04/10/19 0612      Subjective: Patient seen and examined at the bedside this morning.  Medically stable for discharge today.  Objective: Vitals:   04/14/19 1806 04/14/19 2132 04/15/19 0512 04/15/19 0514  BP: (!) 122/50 (!) 137/49 129/65   Pulse: 71 73 87   Resp: 16 20 18    Temp:  98.5 F (36.9 C) 98.9 F (37.2 C)   TempSrc:   Oral   SpO2: 100% 99% 99%   Weight:    125.5 kg  Height:        Intake/Output Summary (Last 24 hours) at 04/15/2019 1038 Last data filed at 04/15/2019 0514 Gross per 24 hour  Intake 240 ml  Output 851 ml  Net -611 ml   Filed Weights   04/13/19 0443 04/14/19 0449 04/15/19 0514  Weight: 124 kg 128 kg 125.5 kg    Examination:  General exam: Appears calm and comfortable ,Not in distress,obese HEENT:PERRL,Oral mucosa moist, Ear/Nose normal on gross exam Respiratory system: Bilateral equal air entry, normal vesicular breath sounds, no wheezes or crackles  Cardiovascular system: S1 & S2 heard, RRR. No JVD, murmurs, rubs, gallops or clicks. No pedal edema.Chemo port Gastrointestinal system: Abdomen is nondistended, soft and nontender. No organomegaly or masses felt. Normal bowel sounds heard. Central nervous system: Alert and oriented. No focal neurological deficits. Extremities: Bilateral improving lower extremity edema, improving cellulitis, no clubbing ,no cyanosis   Data Reviewed: I have personally reviewed following labs and imaging studies  CBC: Recent Labs  Lab 04/10/19 0018 04/11/19 0459 04/14/19 0448  WBC 6.2 5.6 6.7  NEUTROABS 4.7  --  5.1  HGB 9.5* 9.8* 8.0*  HCT 31.1* 31.8* 25.7*  MCV 101.3* 100.6* 98.5  PLT 218 202 161   Basic Metabolic Panel: Recent Labs  Lab 04/10/19 0018 04/11/19 0459 04/12/19 0435 04/13/19 0457 04/14/19 0448  NA 136  --  135 134* 132*  K 3.9  --  4.0 4.3 4.1  CL 101  --  95* 93* 88*  CO2 24  --  28 31 32  GLUCOSE 261*  --  400* 405* 331*  BUN 50*   --  42* 43* 52*  CREATININE 2.22* 1.77* 1.64* 1.61* 1.42*  CALCIUM 9.2  --  8.6* 8.4* 8.7*   GFR: Estimated Creatinine Clearance: 50.6 mL/min (A) (by C-G formula based on SCr of 1.42 mg/dL (H)). Liver Function Tests: Recent Labs  Lab 04/10/19 0018  AST 19  ALT 12  ALKPHOS 87  BILITOT 0.5  PROT 6.8  ALBUMIN 3.6   No results for input(s): LIPASE, AMYLASE in the last 168 hours. No results for input(s): AMMONIA in the last 168 hours. Coagulation Profile: No results for input(s): INR, PROTIME in the last 168 hours. Cardiac Enzymes: No results for input(s): CKTOTAL, CKMB, CKMBINDEX, TROPONINI in the last 168 hours. BNP (last 3 results) No results for input(s): PROBNP in the last 8760 hours. HbA1C: No results for input(s): HGBA1C in the last 72 hours. CBG: Recent Labs  Lab 04/14/19 0821 04/14/19 1213 04/14/19 1646 04/14/19 2129 04/15/19 0755  GLUCAP 321* 339* 290* 279* 223*   Lipid Profile: No results for input(s): CHOL, HDL, LDLCALC, TRIG, CHOLHDL, LDLDIRECT in the last 72 hours. Thyroid Function Tests: No results for  input(s): TSH, T4TOTAL, FREET4, T3FREE, THYROIDAB in the last 72 hours. Anemia Panel: No results for input(s): VITAMINB12, FOLATE, FERRITIN, TIBC, IRON, RETICCTPCT in the last 72 hours. Sepsis Labs: Recent Labs  Lab 04/10/19 0018  LATICACIDVEN 1.4    Recent Results (from the past 240 hour(s))  SARS CORONAVIRUS 2 (TAT 6-24 HRS) Nasopharyngeal Nasopharyngeal Swab     Status: None   Collection Time: 04/10/19  3:15 AM   Specimen: Nasopharyngeal Swab  Result Value Ref Range Status   SARS Coronavirus 2 NEGATIVE NEGATIVE Final    Comment: (NOTE) SARS-CoV-2 target nucleic acids are NOT DETECTED. The SARS-CoV-2 RNA is generally detectable in upper and lower respiratory specimens during the acute phase of infection. Negative results do not preclude SARS-CoV-2 infection, do not rule out co-infections with other pathogens, and should not be used as the sole  basis for treatment or other patient management decisions. Negative results must be combined with clinical observations, patient history, and epidemiological information. The expected result is Negative. Fact Sheet for Patients: SugarRoll.be Fact Sheet for Healthcare Providers: https://www.woods-mathews.com/ This test is not yet approved or cleared by the [place] FDA and  has been authorized for detection and/or diagnosis of SARS-CoV-2 by FDA under an Emergency Use Authorization (EUA). This EUA will remain  in effect (meaning this test can be used) for the duration of the COVID-19 declaration under Section 56 4(b)(1) of the Act, 21 U.S.C. section 360bbb-3(b)(1), unless the authorization is terminated or revoked sooner. Performed at [facility], [place] [address], [place], [place] [address]          Radiology Studies: No results found.      Scheduled Meds: . allopurinol  300 mg Oral Daily  . aspirin EC  81 mg Oral Daily  . atorvastatin  80 mg Oral q1800  . busPIRone  7.5 mg Oral BID  . enoxaparin (LOVENOX) injection  40 mg Subcutaneous Q24H  . feeding supplement (PRO-STAT SUGAR FREE 64)  30 mL Oral BID  . furosemide  40 mg Oral BID  . gabapentin  600 mg Oral TID  . insulin aspart  0-20 Units Subcutaneous TID WC  . insulin aspart  0-5 Units Subcutaneous QHS  . insulin detemir  50 Units Subcutaneous QHS  . levothyroxine  200 mcg Oral Q0600  . pantoprazole  40 mg Oral BID  . polyethylene glycol  17 g Oral BID  . Ensure Max Protein  11 oz Oral Daily  . senna  1 tablet Oral Daily  . silver sulfADIAZINE  1 application Topical BID  . sucralfate  1 g Oral TID WC & HS  . topiramate  75 mg Oral QHS  . umeclidinium-vilanterol  1 puff Inhalation Daily   Continuous Infusions:   LOS: 5 days    Time spent: 25 mins.More than 50% of that time was spent in counseling and/or coordination of care.      Shelly Coss, MD  [facility] Pager [phone]  If 7PM-7AM, please contact night-coverage [email or web address] Password [email or web address] 04/15/2019, 10:38 AM

## 2019-04-15 NOTE — TOC Progression Note (Signed)
Transition of Care [facility]) - Progression Note    Patient Details  Name: Stephanie Combs MRN: [id number] Date of Birth: 1951-01-11  Transition of Care [facility]) CM/SW Contact  Darnita Woodrum, Juliann Pulse, RN Phone Number: 04/15/2019, 1:07 PM  Clinical Narrative:Received call from nurse  That patient needed 02 travel tank to leave the hospital. CM called [facility]-they will deliver but they do not know how long-when CM contacted nurse to inform of lenghty delivery time-nurse stated patient's dtr will go home & pick up travel tank to bring to hospital. CM contacted [place] to inform to cancel home 02 travel tank delivery. No further CM needs.       Expected Discharge Plan: [place] Barriers to Discharge: No Barriers Identified  Expected Discharge Plan and Services Expected Discharge Plan: [place]   Discharge Planning Services: CM Consult   Living arrangements for the past 2 months: Apartment Expected Discharge Date: 04/15/19                         HH Arranged: PT HH Agency: [place] Date [place]: 04/13/19 Time HH Agency Contacted: 1130 Representative spoke with at [place]: [place] ([place]) Interventions    Readmission Risk Interventions Readmission Risk Prevention Plan 04/11/2019 10/13/2018 09/02/2018  Transportation Screening Complete Complete -  Medication Review Press photographer) Complete Complete -  PCP or Specialist appointment within 3-5 days of discharge Complete (No Data) Complete  PCP/Specialist Appt Not Complete comments - Recent admit; remains acutely ill -  HRI or Home Care Consult Complete Complete -  SW Recovery Care/Counseling Consult Not Complete Complete -  SW Consult Not Complete Comments not appropriate - -  Palliative Care Screening Not Applicable Not Applicable -  [place] Not Applicable Not Applicable -  Some recent data might be  hidden

## 2019-04-16 DIAGNOSIS — R5381 Other malaise: Secondary | ICD-10-CM | POA: Diagnosis not present

## 2019-04-16 DIAGNOSIS — R69 Illness, unspecified: Secondary | ICD-10-CM | POA: Diagnosis not present

## 2019-04-18 ENCOUNTER — Ambulatory Visit: Payer: Medicare Other

## 2019-04-18 ENCOUNTER — Telehealth: Payer: Self-pay

## 2019-04-18 ENCOUNTER — Other Ambulatory Visit: Payer: Self-pay | Admitting: *Deleted

## 2019-04-18 ENCOUNTER — Encounter: Payer: Self-pay | Admitting: *Deleted

## 2019-04-18 NOTE — Telephone Encounter (Signed)
Patient called requesting a return call, attempted to contact the patient, no answer.

## 2019-04-18 NOTE — Telephone Encounter (Signed)
RN called patient to schedule palliative care visit.  Patient requests RN call her daughter Stephanie Combs to schedule appointment as she gets confused with days and times.  RN called patients daughter Stephanie Combs and requested call back to schedule visit time.

## 2019-04-18 NOTE — Telephone Encounter (Signed)
Patients daughter [name] returned RN's call.  Daughter in agreement with palliative care making home visit 04/20/19 at 9:30 AM.

## 2019-04-18 NOTE — Telephone Encounter (Signed)
Spoke with [facility] with [place] at length about patient's condition/needs.  Mobility is the biggest issue at this time I suggested send PT in first.  She agreed and will reach out to the patient today and call me back to let me know what the outcome is.

## 2019-04-18 NOTE — Patient Outreach (Signed)
[facility] [facility]) Care Management  04/18/2019  Stephanie Combs September 04, 1950 [id number]   CSW received a request from [employer], Social Work colleague, also with [place] Management, to follow-up with patient regarding meal services, in her absence.  CSW has made a few phone attempts to try and contact patient today, without success.  HIPAA compliant messages have been left for patient on voicemail and CSW is currently awaiting a return call.  CSW or Stephanie Combs will attempt to make another outreach attempt to patient within the next 3-4 business days, if a return call is not received from patient in the meantime.  CSW has already completed the [place] Meal Service Referral Form (Mom's Meals) for patient, but is unable to submit for processing until CSW is able to obtain additional information from patient.  This information includes meal plan selection, desired menu options, allergies and diet restrictions.   Stephanie Combs, BSW, MSW, LCSW  Licensed Education officer, environmental Health System  Mailing [place] [address], [place], [place] [address] Physical Address-[address], [place], [place] [address] Toll Free Main # [phone] Fax # [phone] Cell # [phone]  Office # [phone] [email or web address]

## 2019-04-19 DIAGNOSIS — Z743 Need for continuous supervision: Secondary | ICD-10-CM | POA: Diagnosis not present

## 2019-04-19 DIAGNOSIS — W19XXXA Unspecified fall, initial encounter: Secondary | ICD-10-CM | POA: Diagnosis not present

## 2019-04-19 DIAGNOSIS — I4891 Unspecified atrial fibrillation: Secondary | ICD-10-CM | POA: Diagnosis not present

## 2019-04-19 DIAGNOSIS — R0689 Other abnormalities of breathing: Secondary | ICD-10-CM | POA: Diagnosis not present

## 2019-04-20 ENCOUNTER — Other Ambulatory Visit: Payer: Self-pay | Admitting: *Deleted

## 2019-04-20 ENCOUNTER — Other Ambulatory Visit: Payer: Medicare Other

## 2019-04-20 ENCOUNTER — Other Ambulatory Visit: Payer: Self-pay

## 2019-04-20 DIAGNOSIS — R69 Illness, unspecified: Secondary | ICD-10-CM | POA: Diagnosis not present

## 2019-04-20 DIAGNOSIS — Z515 Encounter for palliative care: Secondary | ICD-10-CM

## 2019-04-20 DIAGNOSIS — R5381 Other malaise: Secondary | ICD-10-CM | POA: Diagnosis not present

## 2019-04-20 NOTE — Patient Outreach (Signed)
[facility]) Care Management  04/20/2019  VANNESSA GODOWN 13-Mar-1951 [id number]   CSW was able to converse with Neldon Labella, RNCM with [place] Management, to obtain medical information to complete the [facility] [employer] Service Referral Form (Mom's Meals application) and submit for processing.  Confirmation e-mail received from [email or web address].  Nat Christen, BSW, MSW, LCSW  Licensed Education officer, environmental Health System  Mailing [place] [address] [place], [place] [address] Physical Address-[address], [place], [place] [address] Toll Free Main # [phone] Fax # [phone] Cell # [phone]  Office # [phone] [email or web address]

## 2019-04-20 NOTE — Progress Notes (Signed)
COMMUNITY PALLIATIVE CARE SW NOTE  PATIENT NAME: Stephanie Combs DOB: Jan 21, 1951 MRN: [id number]  PRIMARY CARE PROVIDER: [name], MD  RESPONSIBLE PARTY:  Acct ID - Guarantor Home Phone Work Phone Relationship Acct Type  [id number] Stephanie, [phone]  Self P/F     [place], [place], [place] [address]     PLAN OF CARE and INTERVENTIONS:             1. GOALS OF CARE/ ADVANCE CARE PLANNING:  Patient is a DNR, form is in the home. HCPOA is Building services engineer. Goal is to walk with her walker, and improve.   2. SOCIAL/EMOTIONAL/SPIRITUAL ASSESSMENT/ INTERVENTIONS:  SW and RN met with patient and Stephanie Combs (patient's daughter). Patient and Stephanie Combs provided brief history and overview of care. Patient has had multiple hospitalizations in the past month. Patient reports pain in her back. Patient had an unwitnessed fall yesterday and is sore today. Patient's appetite is poor. Patient said she is sleeping well. Patient is a widow. Stephanie Combs is patient's only child, adopted her when she was young. 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 Patient is concerned with her increased needs with limited support. Team provided education on options for care - including SNF and home health. SW provided emotional support, used active and reflective listening, used solution focused techniques and assessed safety.  3. PATIENT/CAREGIVER EDUCATION/ COPING:  Patient was alert, engaged. Patient was tearful discussing her decline and dependence on others. Patient said she has a limited number of family and friends available to help, but reports that daughter is supportive and helps as much as she can. Daughter does work full-time. 4. PERSONAL EMERGENCY PLAN:  Patient/family will call 9-1-1 for emergencies. 5. COMMUNITY RESOURCES COORDINATION/ HEALTH CARE NAVIGATION:  Patient has follow-up on Friday for labs and meet with oncologist to discuss chemo options and plan. SW contacted PCP to request  referral for PT, RN and aide to [employer]. SW and RN discussed need for additional care, patient is open to SNF for rehabilitation and Stephanie Combs agrees that this would be most appropriate at this time. SW left VM for admission director at [facility]. SW also contacted Babs Sciara, [facility] RN to request assistance with health care navigation. 11:00AM - SW spoke with Larene Beach, admissions director at [facility] and she advised that patient should be evaluated by home health PT first and then admission could be considered based on this evaluation. 1:20PM Claiborne Billings, [facility] SW contacted SW to discuss and will contact Larene Beach to assist with supporting documentation. 3:00PM - SW contacted [name] to confirm referral was received, spoke with Devita and she reports that referral was declined due to staffing. SW notified [facility] team. 3:30PM - SW spoke with Magda Paganini at PCP, she is going to send referral to other home health agencies and request urgent PT evaluation. SW updated [place], [facility] SW.  6. FINANCIAL/LEGAL CONCERNS/INTERVENTIONS:  Patient has fixed income and Medicaid coverage.     SOCIAL HX:  Social History   Tobacco Use  . Smoking status: Former Smoker    Packs/day: 1.50    Years: 45.00    Pack years: 67.50    Types: Cigarettes    Quit date: 11/24/2014    Years since quitting: 4.4  . Smokeless tobacco: Never Used  Substance Use Topics  . Alcohol use: No    Alcohol/week: 0.0 standard drinks    CODE STATUS:   Code Status: Prior (DNR) ADVANCED DIRECTIVES:  Y MOST FORM COMPLETE:  No. HOSPICE EDUCATION PROVIDED: None.  PPS: Patient is dependent of ADLs. Patient is doing transfers only.  I spent 75 minutes with patient/family, from 9:30-10:45a providing education, support and consultation. Additional 60 minutes spent for care coordination.  Margaretmary Lombard, LCSW

## 2019-04-20 NOTE — Patient Outreach (Signed)
[place] [facility]) Care Management  04/20/2019  JAKAIYA NETHERLAND 1950-07-27 [id number]   CSW received call from [place] that patient is hoping to be placed at [facility] for short-term rehab. CSW noted that [facility] LCSW, Margaretmary Lombard (ph#: [phone]) had made a visit earlier today and CSW called to coordinate efforts with her. Whitney informed CSW that Dania Beach at [facility] (patient's first choice) is aware & needed an updated PT evaluation for [facility] approval before patient could be admitted. Whitney states that she asked patient's PCP for an order for [facility] and will follow-up with [facility] to see when they would be able to come out for PT evaluation. CSW left message for Larene Beach at [name] (ph#: [phone], cell#: [phone]) as well to offer assistance with [facility] approval/provide progress notes through [place]. CSW awaiting returned call.    Raynaldo Opitz, LCSW [facility]  Clinical Social Worker cell #: [phone]

## 2019-04-20 NOTE — Progress Notes (Signed)
PATIENT NAME: Stephanie Combs DOB: 1951/02/20 MRN: [id number]  PRIMARY CARE PROVIDER: [name], MD  RESPONSIBLE PARTY:  Acct ID - Guarantor Home Phone Work Phone Relationship Acct Type  [id number] Stephanie, [phone]  Self P/F     [address], [place], [place] [address]    PLAN OF CARE and INTERVENTIONS:               1.  GOALS OF CARE/ ADVANCE CARE PLANNING:  Patient wants to ambulate with her walker, get stronger and improve.                  2.  PATIENT/CAREGIVER EDUCATION:  Education on fall precautions, education on s/s of infection, home safety, reviewed meds.                 3.  DISEASE STATUS: SW and RN made scheduled home visit. Patient sitting in her recliner covered with blanket. Patients daughter Stephanie Combs in home with patient. Patient was discharged home last week. Patients daughter thought [facility] should have restarted.  Patient has not heard from home health agency. Social worker contacted  [facility] and [facility] informed social worker they had declined referral. Patient is unable to ambulate. Patient tearful throughout visit. Patient is not safe to be at home alone and daughter does not live in the home and continues to work.  Patient reports she slid to the floor yesterday and was in the floor for several hours. Patient states she wants to get stronger, be able to walk with walker and remain in her own home. Patient agreeable to going to rehab facility. Social worker reached out to [facility] and [facility].  Patient is scheduled to see oncologist again on Friday. Patient is unsure if she will be able to receive anymore chemo.  Patient is obese weighing 284 lb.  Patient also has abdominal edema and edema in lower extremities.  Patient receiving O2 via nasal cannula at 2 - 4 L per minute. Patient's past medical history includes hypothyroidism, hypertension, history of medication noncompliance, diabetes, COPD, degenerative disc disease, chronic  pain, CHF, CAD and anxiety. Patient has been hospitalized twice in the past three weeks. Patient reports she has back pain and feels sore today that she feels is related to lyin gin floor yesterday.  Patient has Oxycodone and Percocet to take for pain. Patients heat is not working and it is very cold in patients house. Patient covered with blankets and is shaking. Patient does have small heater in home. Patient states repairman is coming to work on heating unit today. Patient reports she can stand to pivot to bedside commode with assist.  Patient has called EMS service out to the home to assist her to get up to bedside commode multiple times. Nurse reviewed patient's medications with patient. Patient reports she has been taking meds however nurse is unsure if patient knows how to accurately take meds. Patient is adamant she takes meds correctly.  Patient appears anxious and does seem forgetful.  Patient reports she had not eaten for two days but did eat chicken and dumplings and pound cake yesterday. Nurse feels if patient is not placed in rehab patient will likely have to return to the hospital. Support provided to patient and daughter and both were encouraged to contact palliative care with questions or concerns. Patient and daughter informed palliative care team would be in touch as soon as team heard from rehab facility and [facility].  HISTORY OF PRESENT ILLNESS:    CODE STATUS: DNR  ADVANCED DIRECTIVES: Y MOST FORM: No PPS: 40%   PHYSICAL EXAM:   VITALS: Today's Vitals   04/20/19 1408  BP: (!) 148/62  Pulse: 66  Resp: 20  Temp: (!) 96.2 F (35.7 C)  TempSrc: Temporal  Weight: 284 lb (128.8 kg)  PainSc: 3   PainLoc: Back    LUNGS: decreased breath sounds CARDIAC: Cor RRR  EXTREMITIES: 3+ and non-pitting edema SKIN: Skin color, texture, turgor normal. No rashes or lesions  NEURO: positive for gait problems, memory problems, tremors and weakness       Stephanie Simmer, RN

## 2019-04-21 ENCOUNTER — Other Ambulatory Visit: Payer: Self-pay

## 2019-04-21 ENCOUNTER — Telehealth: Payer: Self-pay

## 2019-04-21 ENCOUNTER — Inpatient Hospital Stay: Payer: Medicare Other | Attending: Hematology | Admitting: Hematology

## 2019-04-21 DIAGNOSIS — I2510 Atherosclerotic heart disease of native coronary artery without angina pectoris: Secondary | ICD-10-CM | POA: Diagnosis not present

## 2019-04-21 DIAGNOSIS — C3412 Malignant neoplasm of upper lobe, left bronchus or lung: Secondary | ICD-10-CM | POA: Diagnosis not present

## 2019-04-21 DIAGNOSIS — R531 Weakness: Secondary | ICD-10-CM | POA: Diagnosis not present

## 2019-04-21 DIAGNOSIS — C183 Malignant neoplasm of hepatic flexure: Secondary | ICD-10-CM

## 2019-04-21 DIAGNOSIS — W19XXXA Unspecified fall, initial encounter: Secondary | ICD-10-CM | POA: Diagnosis not present

## 2019-04-21 MED ORDER — ALPRAZOLAM 1 MG PO TABS: 1.0000 mg | ORAL_TABLET | Freq: Three times a day (TID) | ORAL | 0 refills | Status: DC | PRN

## 2019-04-21 MED ORDER — OXYCODONE-ACETAMINOPHEN 10-325 MG PO TABS: 1.0000 | ORAL_TABLET | Freq: Four times a day (QID) | ORAL | 0 refills | Status: DC | PRN

## 2019-04-21 NOTE — Telephone Encounter (Signed)
SW received call from Magda Paganini, nurse at PCP office. Magda Paganini was calling to notify SW that none of the local home health agencies were able to accept the referral for PT at this time. SW notified palliative team. [facility] team is aware and discussing additional options.  SW also contacted NP with patient's pain clinic and discussed status. Patient is unable to get to clinic for appointment, NP to consider telehealth. SW advised that NP should contact [name] (patient's daughter) to coordinate. NP also plans to contact patient's oncologist.

## 2019-04-21 NOTE — Progress Notes (Signed)
[place]   [phone] [phone]   Clinic Follow up Note   Patient Care Team: [name], MD as PCP - General (Internal Medicine) Herminio Commons, MD as PCP - Cardiology (Cardiology) Neldon Labella, RN as [facility] Management Truitt Merle, MD as Consulting Physician (Hematology) Phillips Odor, MD as Consulting Physician (Neurology)   I connected with Stephanie Combs on 04/22/2019 at  3:00 PM EST by telephone visit and verified that I am speaking with the correct person using two identifiers.  I discussed the limitations, risks, security and privacy concerns of performing an evaluation and management service by telephone and the availability of in person appointments. I also discussed with the patient that there may be a patient responsible charge related to this service. The patient expressed understanding and agreed to proceed.   Other persons participating in the visit and their role in the encounter:  Her daughter Stephanie Combs   Patient's location:  Her home Provider's location:  My Office  CHIEF COMPLAINT: F/u Small cell lung cancerand colon cancer  SUMMARY OF ONCOLOGIC HISTORY: Oncology History Overview Note  Cancer Staging Primary cancer of hepatic flexure of colon ([place]) Staging form: Colon and Rectum, AJCC 8th Edition - Clinical stage from 09/09/2018: Stage Unknown (cTX, cN0, cM0) - Signed by Truitt Merle, MD on 09/28/2018  Small cell lung cancer, left upper lobe ([place]) Staging form: Lung, AJCC 8th Edition - Clinical stage from 09/14/2018: Stage IIIB (cT3, cN2, cM0) - Signed by Truitt Merle, MD on 09/28/2018     Primary cancer of hepatic flexure of colon [facility])   Initial Diagnosis   Malignant neoplasm of hepatic flexure ([facility])   09/09/2018 Procedure   Coloscopy by Dr Gala Romney 09/09/18  IMPRESSION -Colon mass as described in the vicinity of the hepatic flexure - status post biopsy and inking. Polyps associated with this tumor  scattered all the way to the cecum were not removed; multiple splenic flexure and rectal polyps were removed as described above. Left-sided diverticulosis.  Upper endoscopy By Dr Gala Romney 09/09/18  IMPRESSION -Abnormal distal esophagus suspicious for Barrett's esophagus?biopsied. - Small hiatal hernia. - Erosive gastropathy. Biopsied. -Duodenal bulbar erosions   09/09/2018 Initial Biopsy   Diagnosis 09/09/18 1. Stomach, biopsy - GASTRIC ANTRAL MUCOSA WITH MILD NONSPECIFIC REACTIVE GASTROPATHY - GASTRIC OXYNTIC MUCOSA WITH PARIETAL CELL HYPERPLASIA AS CAN BE SEEN IN HYPERGASTRINEMIC STATES SUCH AS PROPER PATIENT IDENTIFICATION THERAPY. - WARTHIN STARRY STAIN IS NEGATIVE FOR HELICOBACTER PYLORI 2. Esophagus, biopsy - BARRETT'S ESOPHAGUS, NEGATIVE FOR DYSPLASIA 3. Colon, polyp(s), descending - TUBULAR ADENOMA - NEGATIVE FOR HIGH-GRADE DYSPLASIA OR MALIGNANCY 4. Colon, biopsy, hepatic flexure - ADENOCARCINOMA. SEE NOTE 5. Colon, polyp(s), splenic flexure - ADENOCARCINOMA. SEE NOTE - OTHER FRAGMENTS OF TUBULAR ADENOMA(S) WITH ONE SHOWING HIGH-GRADE DYSPLASIA 6. Rectum, polyp(s) - TUBULAR ADENOMA(S) - NEGATIVE FOR HIGH-GRADE DYSPLASIA OR MALIGNANCY   09/09/2018 Cancer Staging   Staging form: Colon and Rectum, AJCC 8th Edition - Clinical stage from 09/09/2018: Stage Unknown (cTX, cN0, cM0) - Signed by Truitt Merle, MD on 09/28/2018   09/11/2018 Imaging   CT CAP 09/11/18  IMPRESSION: 1. Central perihilar left upper lobe 5.4 cm lung mass, new since 01/27/2017 chest CT, compatible with malignancy, favor primary bronchogenic carcinoma. Postobstructive pneumonia/atelectasis in the lingula. 2. Mediastinal adenopathy in the left prevascular/AP window and subcarinal stations, compatible with metastatic nodes (more likely originating from the left upper lobe lung mass). 3. Trace dependent left pleural effusion. 4. Apple-core lesion in the ascending colon, compatible  with known primary colonic  neoplasm. Adjacent top-normal size right mesenteric lymph node is equivocal for locoregional nodal metastasis. No additional potential findings of metastatic disease in the abdomen or pelvis. 5. Chronic findings include: Aortic Atherosclerosis (ICD10-I70.0). Borderline mild cardiomegaly. One vessel coronary atherosclerosis. Dilated main pulmonary artery, suggesting pulmonary arterial hypertension. Left adrenal adenoma. Punctate bilateral nephrolithiasis. Small hiatal hernia.   09/20/2018 Imaging   Brain MRI 09/20/18  IMPRESSION: 1. Motion degraded examination without brain metastases identified. 2. Small skull metastases not excluded. 3. Moderate to severe chronic small vessel ischemic disease.   Small cell lung cancer, left upper lobe ([place])  09/11/2018 Imaging   CT CAP 09/11/18  IMPRESSION: 1. Central perihilar left upper lobe 5.4 cm lung mass, new since 01/27/2017 chest CT, compatible with malignancy, favor primary bronchogenic carcinoma. Postobstructive pneumonia/atelectasis in the lingula. 2. Mediastinal adenopathy in the left prevascular/AP window and subcarinal stations, compatible with metastatic nodes (more likely originating from the left upper lobe lung mass). 3. Trace dependent left pleural effusion. 4. Apple-core lesion in the ascending colon, compatible with known primary colonic neoplasm. Adjacent top-normal size right mesenteric lymph node is equivocal for locoregional nodal metastasis. No additional potential findings of metastatic disease in the abdomen or pelvis. 5. Chronic findings include: Aortic Atherosclerosis (ICD10-I70.0). Borderline mild cardiomegaly. One vessel coronary atherosclerosis. Dilated main pulmonary artery, suggesting pulmonary arterial hypertension. Left adrenal adenoma. Punctate bilateral nephrolithiasis. Small hiatal hernia.    09/14/2018 Initial Biopsy   Diagnosis 5/5.20 Lung, biopsy, LUL - SMALL CELL CARCINOMA.   09/14/2018 Cancer Staging    Staging form: Lung, AJCC 8th Edition - Clinical stage from 09/14/2018: Stage IIIB (cT3, cN2, cM0) - Signed by Truitt Merle, MD on 09/28/2018   09/15/2018 Initial Diagnosis   Small cell lung cancer, left upper lobe ([place])   09/18/2018 - 12/17/2018 Chemotherapy   Carboplatin and etoposide every 3 weeks for 4 cycles starting 09/18/18. Held after C2D1 due to COVID-19(+). She restarted with cycle 3 on 11/03/18. Completed 5 cycles on 12/17/18.  RT was concurrnet but started on 11/24/18.   09/20/2018 Imaging   Brain MRI 09/20/18  IMPRESSION: 1. Motion degraded examination without brain metastases identified. 2. Small skull metastases not excluded. 3. Moderate to severe chronic small vessel ischemic disease.    11/24/2018 - 01/06/2019 Radiation Therapy    RT was postponed to 11/24/18. Only concurrent with chemo for 2 cycles. Completed on 01/06/19   03/03/2019 Imaging   MRI Brain  IMPRESSION: Interval development of 3 metastatic deposits in the brain as above   Atrophy and moderate chronic microvascular ischemic changes in the white matter.   03/16/2019 - 03/29/2019 Radiation Therapy   RT to Brain with Dr. Lisbeth Renshaw 03/16/19-03/29/19      CURRENT THERAPY:  Observation  INTERVAL HISTORY:  DARIS ARISTIZABAL is here for a follow up. Since last visit she has been hospitalized for altered mental status and her peripheral edema. She notes since her hospital discharge she is not doing well due to weakness of her legs. She was given a lot of IV Lasix in the hospital.  PT has not come to her home yet. She notes she has been needing a lot of help to move her around her home. She has been using EMT help when she has not family around. She notes she has not been reached out to about going to [facility] to help her recover.  She is interested in more treatment especially for her colon cancer.     REVIEW  OF SYSTEMS:   Constitutional: Denies fevers, chills or abnormal weight loss Eyes: Denies blurriness of vision  Ears, nose, mouth, throat, and face: Denies mucositis or sore throat Respiratory: Denies cough, dyspnea or wheezes Cardiovascular: Denies palpitation, chest discomfort (+) Significant lower extremity swelling Gastrointestinal:  Denies nausea, heartburn or change in bowel habits Skin: Denies abnormal skin rashes Lymphatics: Denies new lymphadenopathy or easy bruising Neurological: (+) Leg weaknesses with Minimal ambulation Behavioral/Psych: Mood is stable, no new changes  All other systems were reviewed with the patient and are negative.  MEDICAL HISTORY:  Past Medical History:  Diagnosis Date  . Anxiety   . CAD (coronary artery disease)    a. s/p DES to LAD and angioplasty to D1 in 05/2017  . Cancer ([place])   . CHF (congestive heart failure) ([facility])    Diastolic  . Chronic back pain   . Chronic pain   . COPD (chronic obstructive pulmonary disease) ([place])   . Degenerative disk disease   . Diabetes mellitus without complication ([place])   . History of medication noncompliance 11/2017   "the patient frequently self-adjusts medications without notifying her physicians"  . Hypertension   . Hypothyroidism   . On home O2    2.5 L N/C prn  . Pedal edema     SURGICAL HISTORY: Past Surgical History:  Procedure Laterality Date  . BIOPSY  09/09/2018   Procedure: BIOPSY;  Surgeon: Daneil Dolin, MD;  [facility];  Service: Endoscopy;;  gastric esophageal hepatic flexure colon  . COLONOSCOPY WITH PROPOFOL N/A 09/09/2018   Procedure: COLONOSCOPY WITH PROPOFOL;  Surgeon: Daneil Dolin, MD;  [facility];  Service: Endoscopy;  Laterality: N/A;  . CORONARY STENT INTERVENTION N/A 05/22/2017   Procedure: CORONARY STENT INTERVENTION;  Surgeon: Martinique, Peter M, MD;  Location: [facility];  Service: Cardiovascular;  Laterality: N/A;  . ESOPHAGOGASTRODUODENOSCOPY (EGD) WITH PROPOFOL N/A 09/09/2018   Procedure: ESOPHAGOGASTRODUODENOSCOPY (EGD) WITH PROPOFOL;  Surgeon:  Daneil Dolin, MD;  [facility];  Service: Endoscopy;  Laterality: N/A;  . HERNIA REPAIR    . POLYPECTOMY  09/09/2018   Procedure: POLYPECTOMY;  Surgeon: Daneil Dolin, MD;  [facility];  Service: Endoscopy;;  colon  . RIGHT/LEFT HEART CATH AND CORONARY ANGIOGRAPHY N/A 05/19/2017   Procedure: RIGHT/LEFT HEART CATH AND CORONARY ANGIOGRAPHY;  Surgeon: Leonie Man, MD;  Location: [place] [facility];  Service: Cardiovascular;  Laterality: N/A;  . TUBAL LIGATION    . VIDEO BRONCHOSCOPY WITH ENDOBRONCHIAL ULTRASOUND Left 09/14/2018   Procedure: VIDEO BRONCHOSCOPY WITH ENDOBRONCHIAL ULTRASOUND with biopsies;  Surgeon: Margaretha Seeds, MD;  Location: [facility];  Service: Thoracic;  Laterality: Left;    I have reviewed the social history and family history with the patient and they are unchanged from previous note.  ALLERGIES:  is allergic to dilaudid [hydromorphone hcl]; midodrine hcl; actifed cold-allergy [chlorpheniramine-phenylephrine]; doxycycline; other; penicillins; reglan [metoclopramide]; valium [diazepam]; and vistaril [hydroxyzine hcl].  MEDICATIONS:  Current Outpatient Medications  Medication Sig Dispense Refill  . albuterol (PROAIR HFA) 108 (90 Base) MCG/ACT inhaler Inhale 2 puffs into the lungs every 4 (four) hours as needed for wheezing or shortness of breath. For shortness of breath 18 g 0  . albuterol (PROVENTIL) (2.5 MG/3ML) 0.083% nebulizer solution Take 2.5 mg by nebulization 2 (two) times daily as needed for wheezing or shortness of breath.     . allopurinol (ZYLOPRIM) 300 MG tablet Take 300 mg by mouth daily.    Marland Kitchen  ALPRAZolam (XANAX) 1 MG tablet Take 1 tablet (1 mg total) by mouth 3 (three) times daily as needed for anxiety or sleep. 90 tablet 0  . aspirin EC 81 MG tablet Take 81 mg by mouth daily.    Marland Kitchen atorvastatin (LIPITOR) 80 MG tablet Take 1 tablet (80 mg total) by mouth daily at 6 PM. 30 tablet 0  . busPIRone (BUSPAR) 7.5 MG tablet Take 7.5 mg by  mouth 2 (two) times daily.    . cholecalciferol (VITAMIN D3) 25 MCG (1000 UT) tablet Take 5,000 Units by mouth daily.    . diphenhydrAMINE-zinc acetate (BENADRYL) cream Apply 1 application topically 3 (three) times daily as needed for itching. 28.4 g 2  . diphenoxylate-atropine (LOMOTIL) 2.5-0.025 MG tablet Take 1 tablet by mouth 4 (four) times daily as needed for diarrhea or loose stools.     . furosemide (LASIX) 40 MG tablet Take 60 mg (1 1/2 tabs) in the morning and 40 mg (1 tab) in the afternoon (Patient taking differently: Take 40 mg by mouth daily. ) 75 tablet 6  . gabapentin (NEURONTIN) 600 MG tablet Take 600 mg by mouth 3 (three) times daily.     . hydrocortisone (ANUSOL-HC) 2.5 % rectal cream Place 1 application rectally 4 (four) times daily as needed for hemorrhoids.     . Hydrocortisone (GERHARDT'S BUTT CREAM) CREA Apply 1 application topically 4 (four) times daily as needed for irritation. 1 each 3  . insulin aspart (NOVOLOG) 100 UNIT/ML injection Inject 1-18 Units into the skin 3 (three) times daily with meals.    . insulin detemir (LEVEMIR) 100 UNIT/ML injection Inject 0.4 mLs (40 Units total) into the skin at bedtime. 10 mL 0  . insulin lispro (HUMALOG) 100 UNIT/ML injection Inject 0-20 Units into the skin 3 (three) times daily with meals.     Marland Kitchen levothyroxine (SYNTHROID, LEVOTHROID) 200 MCG tablet Take 1 tablet by mouth daily before breakfast.     . nitroGLYCERIN (NITROSTAT) 0.4 MG SL tablet Place 0.4 mg under the tongue every 5 (five) minutes as needed for chest pain.     Marland Kitchen nystatin (MYCOSTATIN/NYSTOP) powder Apply topically 2 (two) times daily. 30 g 0  . Omega-3 Fatty Acids (FISH OIL PO) Take 1 capsule by mouth daily.     . ondansetron (ZOFRAN) 8 MG tablet Take 1 tablet (8 mg total) by mouth every 8 (eight) hours as needed for nausea or vomiting. 120 tablet 2  . ondansetron (ZOFRAN-ODT) 8 MG disintegrating tablet TAKE 1 TABLET BY MOUTH EVERY 8 HOURS AS NEEDED FOR NAUSEA OR VOMITING.  30 tablet 0  . oxyCODONE (OXY IR/ROXICODONE) 5 MG immediate release tablet Take 1 tablet (5 mg total) by mouth every 12 (twelve) hours as needed for severe pain. 30 tablet 0  . oxyCODONE-acetaminophen (PERCOCET) 10-325 MG tablet Take 1 tablet by mouth every 6 (six) hours as needed for pain. 90 tablet 0  . OXYGEN Inhale 2 L into the lungs continuous.    . pantoprazole (PROTONIX) 40 MG tablet Take 1 tablet (40 mg total) by mouth 2 (two) times daily. 180 tablet 3  . polyethylene glycol (MIRALAX / GLYCOLAX) 17 g packet Take 17 g by mouth 2 (two) times daily. 30 each 0  . potassium chloride SA (KLOR-CON) 20 MEQ tablet Take 1 tablet (20 mEq total) by mouth 2 (two) times daily. 60 tablet 1  . promethazine (PHENERGAN) 12.5 MG tablet Take 1 tablet (12.5 mg total) by mouth every 6 (six) hours as needed for  nausea or vomiting. 45 tablet 1  . senna (SENOKOT) 8.6 MG TABS tablet Take 1 tablet (8.6 mg total) by mouth daily. 120 tablet 0  . silver sulfADIAZINE (SILVADENE) 1 % cream Apply 1 application topically 2 (two) times daily.     . sucralfate (CARAFATE) 1 g tablet Take 1 tablet (1 g total) by mouth 4 (four) times daily -  with meals and at bedtime. 5 min before meals for radiation induced esophagitis 120 tablet 2  . SURE COMFORT INS SYR 1CC/28G 28G X 1/2" 1 ML MISC USE TO INJECT INSULIN UP TO 4 TIMES DAILY. 150 each 5  . topiramate (TOPAMAX) 25 MG tablet Take 75 mg by mouth at bedtime.     Marland Kitchen umeclidinium-vilanterol (ANORO ELLIPTA) 62.5-25 MCG/INH AEPB Inhale 1 puff into the lungs daily. 30 each 0   No current facility-administered medications for this visit.    PHYSICAL EXAMINATION: ECOG PERFORMANCE STATUS: 3 - Symptomatic, >50% confined to bed  No vitals taken today, Exam not performed today   LABORATORY DATA:  I have reviewed the data as listed CBC Latest Ref Rng & Units 04/14/2019 04/11/2019 04/10/2019  WBC 4.0 - 10.5 K/uL 6.7 5.6 6.2  Hemoglobin 12.0 - 15.0 g/dL 8.0(L) 9.8(L) 9.5(L)  Hematocrit  36.0 - 46.0 % 25.7(L) 31.8(L) 31.1(L)  Platelets 150 - 400 K/uL 175 202 218     CMP Latest Ref Rng & Units 04/14/2019 04/13/2019 04/12/2019  Glucose 70 - 99 mg/dL 331(H) 405(H) 400(H)  BUN 8 - 23 mg/dL 52(H) 43(H) 42(H)  Creatinine 0.44 - 1.00 mg/dL 1.42(H) 1.61(H) 1.64(H)  Sodium 135 - 145 mmol/L 132(L) 134(L) 135  Potassium 3.5 - 5.1 mmol/L 4.1 4.3 4.0  Chloride 98 - 111 mmol/L 88(L) 93(L) 95(L)  CO2 22 - 32 mmol/L 32 31 28  Calcium 8.9 - 10.3 mg/dL 8.7(L) 8.4(L) 8.6(L)  Total Protein 6.5 - 8.1 g/dL - - -  Total Bilirubin 0.3 - 1.2 mg/dL - - -  Alkaline Phos 38 - 126 U/L - - -  AST 15 - 41 U/L - - -  ALT 0 - 44 U/L - - -      RADIOGRAPHIC STUDIES: I have personally reviewed the radiological images as listed and agreed with the findings in the report. No results found.   ASSESSMENT & PLAN:  Stephanie Combs is a 68 y.o. female with   1. Small Cell Lung cancer, LUL, cT3N2M0, new brain metastasis in 02/2019 -She was diagnosedin 5/2020during her work up for colon cancer -She has completed concurrent chemo and radiation, with significant chemo dose reduction.She completed radiation therapy on 01/06/19.She overall tolerated both treatment moderately well.  -Unfortunately her MRI brain from 03/03/19 shows interval development of 3 metastatic deposits in the brain.unfortunately she has incurable metastatic disease now   -She underwent RT to brain with Dr. Lisbeth Renshaw in 03/2019. She is currently on observation.  -Her performance status has declined since brain radiation, she was recently hospitalized and has significant LE edema, leg weakness and minimal ambulation. I do not think she is a candidate for more chemotherapy at this time due to her poor PS.  -she lives alone with limited social support. I have referred her to palliative care service. I discussed if she is not on active treatment, she is eligible for hospice home care to gt more help at home. I recommend her to focus on quality of  life, I spoke with her daughter about this too  -She is very adamant about getting  better in order to continue treatment. I encouraged her to proceed with PT or rehab to aid in regaining strength. She is open to this.  -she is adamant to go to SNF. I spoke with palliative care SW Whitney today, she has been helping to arrange rehab facility.  She needs PT evaluation for qualification, unfortunately when he has not been able to find a physical therapist to evaluate her due to her non-compliant in the past and insurance coverage issue  -will continue palliative care at home   2. 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  -Unfortunately she is not a candidate for colon cancer surgery due to very high risk ofsurgical mortality.I discussed the incurablenature of her colon cancer without surgery. She did undergo ChemoRT treatment for her SLCL.  -She is not symptomatic from her colon cancer now.  Due to her poor performance status, I would not offer palliative chemotherapy at this point.  3. 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 -Last bloodtransfusionon 01/05/19. Continue monitoring  4. CAD, CHF, HTN, significant LE edema, CKD -OnLipitor, lasix, nitro -Continue to f/u with cardiologist. -For her Edema she is currently on alternating Lasix doseLasix '40mg'$  in the AM and '20mg'$  in the PM. -She was recently hospitalized in 03/2019 for her worsening LE. She was given IV Lasix. Since discharge she has not been able to ambulate much due to leg weakness. She plans to proceed with PT or Rehab soon.   5.DM,hypothyroidism, morbid obesity -On insulin novolog and long acting levemir. OnLevothyroxine.  6.Rheumatoidarthritis, chronic back pain  -She  use to receiveinjectionfor her back. On Percocet which she takes 4 times a day and Gabapentin. -Her pain medication has been managed by Dr. Phillips Odor in [place] but has been released due to her terminal cancer. I will take over management of her pain meds.  -She is currently on Percocet 10-'325mg'$  q6hours. I filled on 04/22/19   7. Depression and anxiety -She has been more depressed with diagnosis of 2 cancers -She is currently on Celebrex and Xanax TID. Her medication is no longer being managed by Dr. Merlene Laughter, I will take over. I filled her Xanax on 04/22/19.   8.Goal of care discussion, DNR/DNI -The patient understands the goal of care is palliative. -I recommend DNR/DNI,she has agreed to DRI/DNR.She has written will and has established her daughter as her POA.  9.History ofCOVID-19 -She was incidentally found to be positive for COVID-19on 10/11/2018, no respiratory symptoms or evidence of pneumonia,she hasGIsymptoms which could be relate -Her testing on 11/04/18 was positive again. -no COVID symptoms  10. Chest Congestion -She denied fever, chills and no neutropenia. Given her high blood sugar, she declined oral prednisone.  -I encouraged her to continue to use inhaler and oxygen as needed.    11. Pain management -Her pain specialist has stopped prescribing her Percocet and Xanax due to her recent developing of metastatic disease, I spoke with them yesterday. -I refilled her percocet and xanax yesterday, will continue manage her pain.    Plan: -Continue palliative care at home, I encourage her to consider hospice  -she wants to go to SNF, palliative care SW is helping her to get PT evaluation but has not been successful so far  -virtual F/u in 2 weeks  -I will refill her pain meds     No problem-specific Assessment &  Plan notes found for this encounter.   No orders of the defined types were placed in this encounter.  I discussed the assessment and  treatment plan with the patient. The patient was provided an opportunity to ask questions and all were answered. The patient agreed with the plan and demonstrated an understanding of the instructions.  The patient was advised to call back or seek an in-person evaluation if the symptoms worsen or if the condition fails to improve as anticipated.  I provided 25 minutes of non face-to-face telephone visit time during this encounter, and > 50% was spent counseling as documented under my assessment & plan.    Truitt Merle, MD 04/22/2019   I, Joslyn Devon, am acting as scribe for Truitt Merle, MD.   I have reviewed the above documentation for accuracy and completeness, and I agree with the above.

## 2019-04-22 ENCOUNTER — Inpatient Hospital Stay: Payer: Medicare Other

## 2019-04-22 ENCOUNTER — Telehealth: Payer: Self-pay

## 2019-04-22 ENCOUNTER — Ambulatory Visit: Payer: Self-pay | Admitting: *Deleted

## 2019-04-22 ENCOUNTER — Telehealth: Payer: Self-pay | Admitting: Hematology

## 2019-04-22 ENCOUNTER — Inpatient Hospital Stay: Payer: Medicare Other | Admitting: Hematology

## 2019-04-22 DIAGNOSIS — R69 Illness, unspecified: Secondary | ICD-10-CM | POA: Diagnosis not present

## 2019-04-22 DIAGNOSIS — R5381 Other malaise: Secondary | ICD-10-CM | POA: Diagnosis not present

## 2019-04-22 NOTE — Telephone Encounter (Signed)
No los per 12/10 

## 2019-04-22 NOTE — Telephone Encounter (Signed)
SW received a message from triage requesting for SW to call patient. SW contacted patient. Patient reports that she needs to go to a SNF for rehab. Patient said that she continues to have difficulty with weakness and getting up from her chair to the bedside commode. Patient reports that she is motivated but is waiting on PT evaluation. SW confirmed this but explained that there has been difficulty with finding an agency to accept the home health referral to complete the evaluation. SW has left another VM with [facility] to discuss. Patient expressed frustration and desire to go to a facility. SW updated daughter, [name]. [name] confirmed that she will help patient as much as she can this weekend. Patient said that she can also call "[facility]" for non-emergent assistance such as helping with safe transfers in the home. Patient states that the staff with "[facility]" told her to call them anytime. SW used active and reflective listening, validated concerns, discussed safety and encouraged patient to consider other care options in the short-term. SW discussed situation with Dr. Burr Medico. Dr. Burr Medico said patient is not a candidate with chemotherapy at this time and one of the main concerns is patient's weakness.  SW also discussed with Babs Sciara, [facility] RN. Felecia noted that [facility] team is working closely with insurance company and [facility] [employer] to discuss options. Team to follow-up with patient next week.

## 2019-04-23 ENCOUNTER — Encounter: Payer: Self-pay | Admitting: Hematology

## 2019-04-23 DIAGNOSIS — R69 Illness, unspecified: Secondary | ICD-10-CM | POA: Diagnosis not present

## 2019-04-23 DIAGNOSIS — R5381 Other malaise: Secondary | ICD-10-CM | POA: Diagnosis not present

## 2019-04-25 ENCOUNTER — Other Ambulatory Visit: Payer: Self-pay

## 2019-04-25 ENCOUNTER — Inpatient Hospital Stay (HOSPITAL_COMMUNITY)
Admission: EM | Admit: 2019-04-25 | Discharge: 2019-04-28 | DRG: 378 | Disposition: A | Discharge status: Skilled Nursing Facility | Payer: Medicare Other | Attending: Family Medicine | Admitting: Family Medicine

## 2019-04-25 ENCOUNTER — Encounter (HOSPITAL_COMMUNITY): Payer: Self-pay | Admitting: Emergency Medicine

## 2019-04-25 ENCOUNTER — Telehealth: Payer: Self-pay | Admitting: Hematology

## 2019-04-25 DIAGNOSIS — M549 Dorsalgia, unspecified: Secondary | ICD-10-CM | POA: Diagnosis present

## 2019-04-25 DIAGNOSIS — J9611 Chronic respiratory failure with hypoxia: Secondary | ICD-10-CM | POA: Diagnosis present

## 2019-04-25 DIAGNOSIS — R1084 Generalized abdominal pain: Secondary | ICD-10-CM | POA: Diagnosis not present

## 2019-04-25 DIAGNOSIS — I959 Hypotension, unspecified: Secondary | ICD-10-CM | POA: Diagnosis not present

## 2019-04-25 DIAGNOSIS — K922 Gastrointestinal hemorrhage, unspecified: Secondary | ICD-10-CM | POA: Diagnosis not present

## 2019-04-25 DIAGNOSIS — K921 Melena: Secondary | ICD-10-CM | POA: Diagnosis not present

## 2019-04-25 DIAGNOSIS — D62 Acute posthemorrhagic anemia: Secondary | ICD-10-CM | POA: Diagnosis not present

## 2019-04-25 DIAGNOSIS — R600 Localized edema: Secondary | ICD-10-CM | POA: Diagnosis not present

## 2019-04-25 DIAGNOSIS — Z794 Long term (current) use of insulin: Secondary | ICD-10-CM

## 2019-04-25 DIAGNOSIS — Z8249 Family history of ischemic heart disease and other diseases of the circulatory system: Secondary | ICD-10-CM

## 2019-04-25 DIAGNOSIS — C3490 Malignant neoplasm of unspecified part of unspecified bronchus or lung: Secondary | ICD-10-CM | POA: Diagnosis not present

## 2019-04-25 DIAGNOSIS — N1832 Chronic kidney disease, stage 3b: Secondary | ICD-10-CM | POA: Diagnosis not present

## 2019-04-25 DIAGNOSIS — Z9221 Personal history of antineoplastic chemotherapy: Secondary | ICD-10-CM

## 2019-04-25 DIAGNOSIS — Z20828 Contact with and (suspected) exposure to other viral communicable diseases: Secondary | ICD-10-CM | POA: Diagnosis present

## 2019-04-25 DIAGNOSIS — Z9114 Patient's other noncompliance with medication regimen: Secondary | ICD-10-CM | POA: Diagnosis not present

## 2019-04-25 DIAGNOSIS — C7800 Secondary malignant neoplasm of unspecified lung: Secondary | ICD-10-CM | POA: Diagnosis not present

## 2019-04-25 DIAGNOSIS — E039 Hypothyroidism, unspecified: Secondary | ICD-10-CM | POA: Diagnosis not present

## 2019-04-25 DIAGNOSIS — Z66 Do not resuscitate: Secondary | ICD-10-CM

## 2019-04-25 DIAGNOSIS — I5032 Chronic diastolic (congestive) heart failure: Secondary | ICD-10-CM | POA: Diagnosis present

## 2019-04-25 DIAGNOSIS — D500 Iron deficiency anemia secondary to blood loss (chronic): Secondary | ICD-10-CM | POA: Diagnosis present

## 2019-04-25 DIAGNOSIS — Z955 Presence of coronary angioplasty implant and graft: Secondary | ICD-10-CM | POA: Diagnosis not present

## 2019-04-25 DIAGNOSIS — G629 Polyneuropathy, unspecified: Secondary | ICD-10-CM | POA: Diagnosis not present

## 2019-04-25 DIAGNOSIS — M5489 Other dorsalgia: Secondary | ICD-10-CM | POA: Diagnosis not present

## 2019-04-25 DIAGNOSIS — C183 Malignant neoplasm of hepatic flexure: Secondary | ICD-10-CM | POA: Diagnosis present

## 2019-04-25 DIAGNOSIS — E1122 Type 2 diabetes mellitus with diabetic chronic kidney disease: Secondary | ICD-10-CM | POA: Diagnosis present

## 2019-04-25 DIAGNOSIS — E1140 Type 2 diabetes mellitus with diabetic neuropathy, unspecified: Secondary | ICD-10-CM | POA: Diagnosis present

## 2019-04-25 DIAGNOSIS — G8929 Other chronic pain: Secondary | ICD-10-CM | POA: Diagnosis not present

## 2019-04-25 DIAGNOSIS — Z7189 Other specified counseling: Secondary | ICD-10-CM | POA: Diagnosis not present

## 2019-04-25 DIAGNOSIS — C7931 Secondary malignant neoplasm of brain: Secondary | ICD-10-CM | POA: Diagnosis not present

## 2019-04-25 DIAGNOSIS — F419 Anxiety disorder, unspecified: Secondary | ICD-10-CM | POA: Diagnosis present

## 2019-04-25 DIAGNOSIS — Z6841 Body Mass Index (BMI) 40.0 and over, adult: Secondary | ICD-10-CM

## 2019-04-25 DIAGNOSIS — J449 Chronic obstructive pulmonary disease, unspecified: Secondary | ICD-10-CM | POA: Diagnosis not present

## 2019-04-25 DIAGNOSIS — Z515 Encounter for palliative care: Secondary | ICD-10-CM | POA: Diagnosis not present

## 2019-04-25 DIAGNOSIS — R601 Generalized edema: Secondary | ICD-10-CM | POA: Diagnosis not present

## 2019-04-25 DIAGNOSIS — C189 Malignant neoplasm of colon, unspecified: Secondary | ICD-10-CM | POA: Diagnosis not present

## 2019-04-25 DIAGNOSIS — K649 Unspecified hemorrhoids: Secondary | ICD-10-CM | POA: Diagnosis present

## 2019-04-25 DIAGNOSIS — Z79890 Hormone replacement therapy (postmenopausal): Secondary | ICD-10-CM

## 2019-04-25 DIAGNOSIS — Z79891 Long term (current) use of opiate analgesic: Secondary | ICD-10-CM

## 2019-04-25 DIAGNOSIS — C3412 Malignant neoplasm of upper lobe, left bronchus or lung: Secondary | ICD-10-CM | POA: Diagnosis not present

## 2019-04-25 DIAGNOSIS — C185 Malignant neoplasm of splenic flexure: Secondary | ICD-10-CM | POA: Diagnosis not present

## 2019-04-25 DIAGNOSIS — Z743 Need for continuous supervision: Secondary | ICD-10-CM | POA: Diagnosis not present

## 2019-04-25 DIAGNOSIS — I130 Hypertensive heart and chronic kidney disease with heart failure and stage 1 through stage 4 chronic kidney disease, or unspecified chronic kidney disease: Secondary | ICD-10-CM | POA: Diagnosis not present

## 2019-04-25 DIAGNOSIS — I2510 Atherosclerotic heart disease of native coronary artery without angina pectoris: Secondary | ICD-10-CM | POA: Diagnosis not present

## 2019-04-25 DIAGNOSIS — Z7982 Long term (current) use of aspirin: Secondary | ICD-10-CM

## 2019-04-25 DIAGNOSIS — J9610 Chronic respiratory failure, unspecified whether with hypoxia or hypercapnia: Secondary | ICD-10-CM | POA: Diagnosis not present

## 2019-04-25 DIAGNOSIS — L89320 Pressure ulcer of left buttock, unstageable: Secondary | ICD-10-CM | POA: Diagnosis not present

## 2019-04-25 DIAGNOSIS — Z823 Family history of stroke: Secondary | ICD-10-CM

## 2019-04-25 DIAGNOSIS — Z03818 Encounter for observation for suspected exposure to other biological agents ruled out: Secondary | ICD-10-CM | POA: Diagnosis not present

## 2019-04-25 DIAGNOSIS — Z79899 Other long term (current) drug therapy: Secondary | ICD-10-CM

## 2019-04-25 DIAGNOSIS — R112 Nausea with vomiting, unspecified: Secondary | ICD-10-CM

## 2019-04-25 DIAGNOSIS — Z9981 Dependence on supplemental oxygen: Secondary | ICD-10-CM

## 2019-04-25 DIAGNOSIS — I5030 Unspecified diastolic (congestive) heart failure: Secondary | ICD-10-CM | POA: Diagnosis not present

## 2019-04-25 DIAGNOSIS — Z833 Family history of diabetes mellitus: Secondary | ICD-10-CM

## 2019-04-25 DIAGNOSIS — E1165 Type 2 diabetes mellitus with hyperglycemia: Secondary | ICD-10-CM | POA: Diagnosis not present

## 2019-04-25 DIAGNOSIS — E119 Type 2 diabetes mellitus without complications: Secondary | ICD-10-CM | POA: Diagnosis not present

## 2019-04-25 DIAGNOSIS — N179 Acute kidney failure, unspecified: Secondary | ICD-10-CM | POA: Diagnosis not present

## 2019-04-25 DIAGNOSIS — Z923 Personal history of irradiation: Secondary | ICD-10-CM

## 2019-04-25 LAB — URINALYSIS, ROUTINE W REFLEX MICROSCOPIC
Bacteria, UA: NONE SEEN
Bilirubin Urine: NEGATIVE
Glucose, UA: >=500 mg/dL — AB
Ketones, ur: NEGATIVE mg/dL
Leukocytes,Ua: NEGATIVE
Nitrite: NEGATIVE
Protein, ur: NEGATIVE mg/dL
Specific Gravity, Urine: 1.01 (ref 1.005–1.030)
pH: 5 (ref 5.0–8.0)

## 2019-04-25 LAB — GLUCOSE, CAPILLARY
Glucose-Capillary: 310 mg/dL — ABNORMAL HIGH (ref 70–99)
Glucose-Capillary: 383 mg/dL — ABNORMAL HIGH (ref 70–99)

## 2019-04-25 LAB — BASIC METABOLIC PANEL
Anion gap: 10 (ref 5–15)
BUN: 69 mg/dL — ABNORMAL HIGH (ref 8–23)
CO2: 25 mmol/L (ref 22–32)
Calcium: 9 mg/dL (ref 8.9–10.3)
Chloride: 100 mmol/L (ref 98–111)
Creatinine, Ser: 2.39 mg/dL — ABNORMAL HIGH (ref 0.44–1.00)
GFR calc Af Amer: 23 mL/min — ABNORMAL LOW (ref >60)
GFR calc non Af Amer: 20 mL/min — ABNORMAL LOW (ref >60)
Glucose, Bld: 375 mg/dL — ABNORMAL HIGH (ref 70–99)
Potassium: 5.9 mmol/L — ABNORMAL HIGH (ref 3.5–5.1)
Sodium: 135 mmol/L (ref 135–145)

## 2019-04-25 LAB — CBC
HCT: 22.4 % — ABNORMAL LOW (ref 36.0–46.0)
Hemoglobin: 6.6 g/dL — CL (ref 12.0–15.0)
MCH: 30.7 pg (ref 26.0–34.0)
MCHC: 29.5 g/dL — ABNORMAL LOW (ref 30.0–36.0)
MCV: 104.2 fL — ABNORMAL HIGH (ref 80.0–100.0)
Platelets: 285 10*3/uL (ref 150–400)
RBC: 2.15 MIL/uL — ABNORMAL LOW (ref 3.87–5.11)
RDW: 15.6 % — ABNORMAL HIGH (ref 11.5–15.5)
WBC: 4.5 10*3/uL (ref 4.0–10.5)
nRBC: 0 % (ref 0.0–0.2)

## 2019-04-25 LAB — PREPARE RBC (CROSSMATCH)

## 2019-04-25 LAB — SARS CORONAVIRUS 2 (TAT 6-24 HRS): SARS Coronavirus 2: NEGATIVE

## 2019-04-25 LAB — POC OCCULT BLOOD, ED: Fecal Occult Bld: POSITIVE — AB

## 2019-04-25 MED ORDER — LACTATED RINGERS IV BOLUS
1000.0000 mL | Freq: Once | INTRAVENOUS | Status: AC
  Administered 2019-04-25: 1000 mL via INTRAVENOUS

## 2019-04-25 MED ORDER — ALBUTEROL SULFATE (2.5 MG/3ML) 0.083% IN NEBU: 3.0000 mL | INHALATION_SOLUTION | RESPIRATORY_TRACT | Status: DC | PRN

## 2019-04-25 MED ORDER — ALLOPURINOL 300 MG PO TABS
300.0000 mg | ORAL_TABLET | Freq: Every day | ORAL | Status: DC
  Administered 2019-04-25 – 2019-04-28 (×4): 300 mg via ORAL
  Filled 2019-04-25 (×4): qty 1

## 2019-04-25 MED ORDER — GABAPENTIN 300 MG PO CAPS
600.0000 mg | ORAL_CAPSULE | Freq: Three times a day (TID) | ORAL | Status: DC
  Administered 2019-04-25 – 2019-04-28 (×9): 600 mg via ORAL
  Filled 2019-04-25 (×9): qty 2

## 2019-04-25 MED ORDER — ALPRAZOLAM 1 MG PO TABS
1.0000 mg | ORAL_TABLET | Freq: Three times a day (TID) | ORAL | Status: DC | PRN
  Administered 2019-04-25 – 2019-04-28 (×7): 1 mg via ORAL
  Filled 2019-04-25 (×7): qty 1

## 2019-04-25 MED ORDER — OXYCODONE-ACETAMINOPHEN 5-325 MG PO TABS
2.0000 | ORAL_TABLET | Freq: Four times a day (QID) | ORAL | Status: DC | PRN
  Administered 2019-04-25 – 2019-04-28 (×7): 2 via ORAL
  Filled 2019-04-25 (×7): qty 2

## 2019-04-25 MED ORDER — TOPIRAMATE 25 MG PO TABS
75.0000 mg | ORAL_TABLET | Freq: Every day | ORAL | Status: DC
  Administered 2019-04-25 – 2019-04-27 (×3): 75 mg via ORAL
  Filled 2019-04-25 (×3): qty 3

## 2019-04-25 MED ORDER — ONDANSETRON HCL 4 MG PO TABS: 8.0000 mg | ORAL_TABLET | Freq: Three times a day (TID) | ORAL | Status: DC | PRN

## 2019-04-25 MED ORDER — SODIUM CHLORIDE 0.9 % IV SOLN: 10.0000 mL/h | Freq: Once | INTRAVENOUS | Status: DC

## 2019-04-25 MED ORDER — INSULIN DETEMIR 100 UNIT/ML ~~LOC~~ SOLN
30.0000 [IU] | Freq: Every day | SUBCUTANEOUS | Status: DC
  Administered 2019-04-25 – 2019-04-27 (×3): 30 [IU] via SUBCUTANEOUS
  Filled 2019-04-25 (×4): qty 0.3

## 2019-04-25 MED ORDER — UMECLIDINIUM-VILANTEROL 62.5-25 MCG/INH IN AEPB
1.0000 | INHALATION_SPRAY | Freq: Every day | RESPIRATORY_TRACT | Status: DC
  Administered 2019-04-26 – 2019-04-28 (×3): 1 via RESPIRATORY_TRACT
  Filled 2019-04-25: qty 14

## 2019-04-25 MED ORDER — SENNA 8.6 MG PO TABS
1.0000 | ORAL_TABLET | Freq: Every day | ORAL | Status: DC
  Administered 2019-04-25 – 2019-04-28 (×4): 8.6 mg via ORAL
  Filled 2019-04-25 (×4): qty 1

## 2019-04-25 MED ORDER — BUSPIRONE HCL 5 MG PO TABS
7.5000 mg | ORAL_TABLET | Freq: Two times a day (BID) | ORAL | Status: DC
  Administered 2019-04-25 – 2019-04-28 (×6): 7.5 mg via ORAL
  Filled 2019-04-25 (×6): qty 2

## 2019-04-25 MED ORDER — POLYETHYLENE GLYCOL 3350 17 G PO PACK
17.0000 g | PACK | Freq: Two times a day (BID) | ORAL | Status: DC
  Administered 2019-04-25 – 2019-04-28 (×5): 17 g via ORAL
  Filled 2019-04-25 (×5): qty 1

## 2019-04-25 MED ORDER — HYDROCORTISONE ACETATE 25 MG RE SUPP
25.0000 mg | Freq: Two times a day (BID) | RECTAL | Status: DC | PRN
  Administered 2019-04-25: 25 mg via RECTAL
  Filled 2019-04-25: qty 1

## 2019-04-25 MED ORDER — PANTOPRAZOLE SODIUM 40 MG PO TBEC
40.0000 mg | DELAYED_RELEASE_TABLET | Freq: Two times a day (BID) | ORAL | Status: DC
  Administered 2019-04-25 – 2019-04-28 (×6): 40 mg via ORAL
  Filled 2019-04-25 (×6): qty 1

## 2019-04-25 MED ORDER — LEVOTHYROXINE SODIUM 100 MCG PO TABS
200.0000 ug | ORAL_TABLET | Freq: Every day | ORAL | Status: DC
  Administered 2019-04-26 – 2019-04-28 (×3): 200 ug via ORAL
  Filled 2019-04-25 (×3): qty 2

## 2019-04-25 NOTE — Evaluation (Signed)
Physical Therapy Evaluation Patient Details Name: Stephanie Combs MRN: [id number] DOB: May 09, 1951 Today's Date: 04/25/2019   History of Present Illness  To the emergency room for evaluation of nursing home placement.  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  The patient has been getting progressively weaker.  Patient is currently not undergoing any chemotherapy.  Patient has been followed by palliative care, home health and they have been trying to get her placed in an inpatient rehab facility.  Patient had an office visit with her oncologist on December 10.  The patient and her current status is not a candidate for more chemotherapy.  Notes indicate that the patient requires a PT consult in order to qualify for skilled nursing facility placement.   Patient states she has been getting weaker over the last week.  She has had to call EMS several times to assist her with going to the commode and transferring around her house.  Patient has had significant lower extremity edema and is making it more difficult for her to walk.  She is becoming more forgetful.  She feels like she is getting more confused.    Clinical Impression  Patient has difficulty moving BLE during bed mobility secondary to swelling and weakness, limited to walking at bedside due to fatigue and fall risk.  Patient required assistance to move legs back onto bed and to reposition self in bed.  Patient will benefit from continued physical therapy in hospital and recommended venue below to increase strength, balance, endurance for safe ADLs and gait.    Follow Up Recommendations SNF    Equipment Recommendations  None recommended by PT    Recommendations for Other Services       Precautions / Restrictions Precautions Precautions: Fall Restrictions Weight Bearing Restrictions: No      Mobility  Bed  Mobility Overal bed mobility: Needs Assistance Bed Mobility: Supine to Sit;Sit to Supine     Supine to sit: Min assist;Mod assist Sit to supine: Mod assist   General bed mobility comments: slow labored movement  Transfers Overall transfer level: Needs assistance Equipment used: Rolling walker (2 wheeled) Transfers: Sit to/from [facility] Sit to Stand: Min assist Stand pivot transfers: Min assist;Mod assist          Ambulation/Gait Ambulation/Gait assistance: Min assist;Mod assist Gait Distance (Feet): 12 Feet Assistive device: Rolling walker (2 wheeled) Gait Pattern/deviations: Decreased step length - right;Decreased step length - left;Decreased stride length Gait velocity: decreased   General Gait Details: slow labored cadence without loss of balance, limited secondary to fatigue  Stairs            Wheelchair Mobility    Modified Rankin (Stroke Patients Only)       Balance Overall balance assessment: Needs assistance Sitting-balance support: No upper extremity supported;Feet supported Sitting balance-Leahy Scale: Fair     Standing balance support: Bilateral upper extremity supported;During functional activity Standing balance-Leahy Scale: Fair Standing balance comment: using RW                             Pertinent Vitals/Pain Pain Assessment: No/denies pain    Home Living Family/patient expects to be discharged to:: Private residence Living Arrangements: Alone Available Help at Discharge: Family;Personal care attendant;Available PRN/intermittently Type of Home: Mobile home Home Access: Ramped entrance     Home Layout: One level Home  Equipment: Gilford Rile - 2 wheels;Walker - 4 wheels;Bedside commode;Shower seat;Wheelchair - manual      Prior Function Level of Independence: Needs assistance   Gait / Transfers Assistance Needed: household ambulator with RW  ADL's / Homemaking Assistance Needed: assisted by family and  personal care attendant        Hand Dominance        Extremity/Trunk Assessment   Upper Extremity Assessment Upper Extremity Assessment: Generalized weakness    Lower Extremity Assessment Lower Extremity Assessment: Generalized weakness    Cervical / Trunk Assessment Cervical / Trunk Assessment: Kyphotic  Communication   Communication: No difficulties  Cognition Arousal/Alertness: Awake/alert Behavior During Therapy: WFL for tasks assessed/performed Overall Cognitive Status: Within Functional Limits for tasks assessed                                        General Comments      Exercises     Assessment/Plan    PT Assessment Patient needs continued PT services  PT Problem List Decreased strength;Decreased activity tolerance;Decreased balance;Decreased mobility       PT Treatment Interventions Gait training;Stair training;Functional mobility training;Therapeutic activities;Therapeutic exercise;Patient/family education    PT Goals (Current goals can be found in the Care Plan section)  Acute Rehab PT Goals Patient Stated Goal: return home after rehab PT Goal Formulation: With patient Time For Goal Achievement: 05/09/19 Potential to Achieve Goals: Good    Frequency Min 2X/week   Barriers to discharge        Co-evaluation               AM-PAC PT "6 Clicks" Mobility  Outcome Measure Help needed turning from your back to your side while in a flat bed without using bedrails?: A Lot Help needed moving from lying on your back to sitting on the side of a flat bed without using bedrails?: A Lot Help needed moving to and from a bed to a chair (including a wheelchair)?: A Little Help needed standing up from a chair using your arms (e.g., wheelchair or bedside chair)?: A Little Help needed to walk in hospital room?: A Lot Help needed climbing 3-5 steps with a railing? : A Lot 6 Click Score: 14    End of Session   Activity Tolerance: Patient  tolerated treatment well;Patient limited by fatigue Patient left: in bed;with call bell/phone within reach Nurse Communication: Mobility status PT Visit Diagnosis: Unsteadiness on feet (R26.81);Other abnormalities of gait and mobility (R26.89);Muscle weakness (generalized) (M62.81)    Time: 7654-6503 PT Time Calculation (min) (ACUTE ONLY): 30 min   Charges:   PT Evaluation $PT Eval Moderate Complexity: 1 Mod PT Treatments $Therapeutic Activity: 23-37 mins        2:25 PM, 04/25/19 Lonell Grandchild, MPT Physical Therapist with [facility] [phone] office [phone] mobile phone

## 2019-04-25 NOTE — Progress Notes (Signed)
Inpatient Diabetes Program Recommendations  AACE/ADA: New Consensus Statement on Inpatient Glycemic Control (2015)  Target Ranges:  Prepandial:   less than 140 mg/dL      Peak postprandial:   less than 180 mg/dL (1-2 hours)      Critically ill patients:  140 - 180 mg/dL   Lab Results  Component Value Date   GLUCAP 315 (H) 04/15/2019   HGBA1C 8.4 (H) 04/11/2019    Review of Glycemic Control  Diabetes history: DM 2 Outpatient Diabetes medications: Levemir 40 units qhs, Novolog 1-18 units tid Current orders for Inpatient glycemic control: Being Evaluated in ED  BUN/Creat: 69/2.39 Hgb 6.6 Glucose 375  Inpatient Diabetes Program Recommendations:    If in the plan of care for pt: -  Consider Levemir 20 units Q 24 hours -  Novolog 0-9 units tid + hs  Thanks,  Tama Headings RN, MSN, BC-ADM Inpatient Diabetes Coordinator Team Pager [phone] (8a-5p)

## 2019-04-25 NOTE — Plan of Care (Signed)
  Problem: Acute Rehab PT Goals(only PT should resolve) Goal: Pt Will Go Supine/Side To Sit Outcome: Progressing Flowsheets (Taken 04/25/2019 1427) Pt will go Supine/Side to Sit:  with min guard assist  with minimal assist Goal: Patient Will Transfer Sit To/From Stand Outcome: Progressing Flowsheets (Taken 04/25/2019 1427) Patient will transfer sit to/from stand: with min guard assist Goal: Pt Will Transfer Bed To Chair/Chair To Bed Outcome: Progressing Flowsheets (Taken 04/25/2019 1427) Pt will Transfer Bed to Chair/Chair to Bed: min guard assist Goal: Pt Will Ambulate Outcome: Progressing Flowsheets (Taken 04/25/2019 1427) Pt will Ambulate:  25 feet  with minimal assist  with rolling walker  with min guard assist   2:27 PM, 04/25/19 Lonell Grandchild, MPT Physical Therapist with [facility] [phone] office [phone] mobile phone

## 2019-04-25 NOTE — H&P (Addendum)
History and Physical    Stephanie Combs:[id number] DOB: 10-01-1950 DOA: 04/25/2019  PCP: Stephanie Gravel, MD  Patient coming from: home  I have personally briefly reviewed patient's old medical records in [place]  Chief Complaint: can't walk  HPI: Stephanie Combs is a 68 y.o. female with medical history significant of coronary artery disease, diabetes, CHF and metastatic small cell lung cancer with mets to the brain who presented with weakness and wanting to get rehab specifically at [facility].  Pt reported that she was able to walk until she got a big bag of fluid right before her last discharge from the hospital, which she said caused her to swell up and then she couldn't walk.  Swelling is present around her abdomen and both legs.  Pt reported being compliant with her diuretic at home.  Pt wanted to go to [facility] to get rehab.  Per ED notes, pt had been calling EMS multiple times per day to help move her and with her tolieting.   No fever, dyspnea, chest pain, abdominal pain, N/V/D.   ED Course: initial vitals: afebrile, HR 85, BP 122/51, sating 99% on room air.  Labs notable for normal WBC, Hgb 6.6, MCV 104.2, K+ 5.9, Cr 2.39 (up from 1.42 about a week ago), BUN 69, fecal occult pos, COVID neg.  Pt received 1L LR in the ED.  Assessment/Plan  # Acute on chronic anemia likely due to GI bleed # Hx of iron def --Hgb 6.6, MCV 104.2 on presentation (Hgb 9-11 in Nov 2020).  Worsening anemia likely due to GI blood loss since pt has known colon cancer, and fecal occult positive. --Recent Vit B12 and folate wnl, iron was def. --1u pRBC started in the ED. PLAN: --Transfuse to keep Hgb >=7 --Repeat iron profile  # AKI on CKD 3b --Cr 2.39 on presentation, up from 1.42 about a week ago.  May be due to over-diuresis at home.   --Received 1L LR in the ED --Trend Cr  # Weakness and inability to transfer --PT/OT for likely rehab placement  # BLE edema and anasarca --Lasix  at home regimen, 60 mg in the morning and 40 mg at night.   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 Burr Medico.  Status post concurrent chemoradiation for small cell lung cancer, status post whole brain radiation.  She is not a surgical candidate  for colon cancer.  --Not a candidate for further treatment --Palliative care already involved  # DM2 on insulin --continue home Levemir as reduced dose of 30u nightly  # Chronic pain on chronic opioids # Neuropathy --continue home opioids --continue home gabapentin --continue home bowel regimen  # Anxiety and depression --continue home Xanax and Buspar  # COPD, stable # Chronic hypoxic respiratory failure on 2-3L at home  --continue home suppl O2 --continue home Anoro  Hypothyroidism:Continue Synthyroid  Morbid obesity: BMI of 44.6.   DVT prophylaxis: SCD/Compression stockings Code Status: DNR  Family Communication: none  Disposition Plan: SNF rehab  Admission status: Inpatient   Review of Systems: As per HPI otherwise 10 point review of systems negative.   Past Medical History:  Diagnosis Date  . Anxiety   . CAD (coronary artery disease)    a. s/p DES to LAD and angioplasty to D1 in 05/2017  . Cancer ([place])   . CHF (congestive heart failure) ([place])  Diastolic  . Chronic back pain   . Chronic pain   . COPD (chronic obstructive pulmonary disease) ([place])   . Degenerative disk disease   . Diabetes mellitus without complication ([place])   . History of medication noncompliance 11/2017   "the patient frequently self-adjusts medications without notifying her physicians"  . Hypertension   . Hypothyroidism   . On home O2    2.5 L N/C prn  . Pedal edema     Past Surgical History:  Procedure Laterality Date  . BIOPSY  09/09/2018   Procedure: BIOPSY;  Surgeon: Daneil Dolin, MD;  [facility];  Service: Endoscopy;;   gastric esophageal hepatic flexure colon  . COLONOSCOPY WITH PROPOFOL N/A 09/09/2018   hepatic flexure colon mass s/p biopsy, with polyps associated with tumor scattered all the way to the cecum not removed, multiple splenic flexure and rectal polyps removed, left-sided diverticulosis, ?right hemicolectomy. Hepatic flexure adenocarcinoma and additional adenocarcinoma in small splenic flexure polyp. Concern for synchronous colon cancer. High risk for surgical resection.   . CORONARY STENT INTERVENTION N/A 05/22/2017   Procedure: CORONARY STENT INTERVENTION;  Surgeon: Martinique, Peter M, MD;  Location: [place] [facility];  Service: Cardiovascular;  Laterality: N/A;  . ESOPHAGOGASTRODUODENOSCOPY (EGD) WITH PROPOFOL N/A 09/09/2018   abnormal distal esophagus suspicious for Barrett's s/p biopsy, erosive gastropathy, duodenal bulbar erosions. +Barrett's  . HERNIA REPAIR    . POLYPECTOMY  09/09/2018   Procedure: POLYPECTOMY;  Surgeon: Daneil Dolin, MD;  [facility];  Service: Endoscopy;;  colon  . RIGHT/LEFT HEART CATH AND CORONARY ANGIOGRAPHY N/A 05/19/2017   Procedure: RIGHT/LEFT HEART CATH AND CORONARY ANGIOGRAPHY;  Surgeon: Leonie Man, MD;  Location: [place] [facility];  Service: Cardiovascular;  Laterality: N/A;  . TUBAL LIGATION    . VIDEO BRONCHOSCOPY WITH ENDOBRONCHIAL ULTRASOUND Left 09/14/2018   Procedure: VIDEO BRONCHOSCOPY WITH ENDOBRONCHIAL ULTRASOUND with biopsies;  Surgeon: Margaretha Seeds, MD;  Location: [facility];  Service: Thoracic;  Laterality: Left;     reports that she quit smoking about 4 years ago. Her smoking use included cigarettes. She has a 67.50 pack-year smoking history. She has never used smokeless tobacco. She reports that she does not drink alcohol or use drugs.  Allergies  Allergen Reactions  . Dilaudid [Hydromorphone Hcl] Itching  . Midodrine Hcl Swelling    After one dose had anaphylactic reaction, had to call EMS.  Marland Kitchen Actifed Cold-Allergy  [Chlorpheniramine-Phenylephrine]     "I was sick and red and it didn't agree with me at all"  . Doxycycline Nausea And Vomiting  . Other Cough    Pt states she is allergic to ragweed and that she starts coughing and sneezing like crazy  . Penicillins Hives and Itching    Tolerates Rocephin Has patient had a PCN reaction causing immediate rash, facial/tongue/throat swelling, SOB or lightheadedness with hypotension: Yes Has patient had a PCN reaction causing severe rash involving mucus membranes or skin necrosis: No Has patient had a PCN reaction that required hospitalization Yes Has patient had a PCN reaction occurring within the last 10 years: No If all of the above answers are "NO", then may proceed with Cephalosporin use.   . Reglan [Metoclopramide] Itching  . Valium [Diazepam] Itching  . Vistaril [Hydroxyzine Hcl] Itching    Family History  Problem Relation Age of Onset  . Stroke Mother        deceased 63   . Heart attack Father  deceased 81   . Diabetes Brother   . Cerebral palsy Brother   . Pneumonia Brother   . Diabetes Other   . Heart attack Other   . Colon cancer Neg Hx      Prior to Admission medications   Medication Sig Start Date End Date Taking? Authorizing Provider  albuterol (PROAIR HFA) 108 (90 Base) MCG/ACT inhaler Inhale 2 puffs into the lungs every 4 (four) hours as needed for wheezing or shortness of breath. For shortness of breath 09/22/18  Yes Eugenie Filler, MD  albuterol (PROVENTIL) (2.5 MG/3ML) 0.083% nebulizer solution Take 2.5 mg by nebulization 2 (two) times daily as needed for wheezing or shortness of breath.    Yes [provider]  allopurinol (ZYLOPRIM) 300 MG tablet Take 300 mg by mouth daily.   Yes [provider]  ALPRAZolam Duanne Moron) 1 MG tablet Take 1 tablet (1 mg total) by mouth 3 (three) times daily as needed for anxiety or sleep. 04/21/19  Yes Truitt Merle, MD  aspirin EC 81 MG tablet Take 81 mg by mouth daily.   Yes  [provider]  atorvastatin (LIPITOR) 80 MG tablet Take 1 tablet (80 mg total) by mouth daily at 6 PM. 05/26/17  Yes Stephanie Gravel, MD  busPIRone (BUSPAR) 7.5 MG tablet Take 7.5 mg by mouth 2 (two) times daily. 03/10/19  Yes [provider]  cholecalciferol (VITAMIN D3) 25 MCG (1000 UT) tablet Take 5,000 Units by mouth daily.   Yes [provider]  diphenhydrAMINE-zinc acetate (BENADRYL) cream Apply 1 application topically 3 (three) times daily as needed for itching. Patient taking differently: Apply 1 application topically 3 (three) times daily as needed for itching. Patient uses on her head for dry scalp. 03/09/19  Yes Truitt Merle, MD  diphenoxylate-atropine (LOMOTIL) 2.5-0.025 MG tablet Take 1 tablet by mouth 4 (four) times daily as needed for diarrhea or loose stools.  01/03/19  Yes [provider]  furosemide (LASIX) 40 MG tablet Take 60 mg (1 1/2 tabs) in the morning and 40 mg (1 tab) in the afternoon Patient taking differently: Take 40 mg by mouth 2 (two) times daily. 40mg  each AM and 20mg  each PM. 10/30/18  Yes Danford, Suann Larry, MD  gabapentin (NEURONTIN) 600 MG tablet Take 600 mg by mouth 3 (three) times daily.    Yes [provider]  hydrocortisone (ANUSOL-HC) 2.5 % rectal cream Place 1 application rectally 4 (four) times daily as needed for hemorrhoids.  01/03/19  Yes [provider]  Hydrocortisone (GERHARDT'S BUTT CREAM) CREA Apply 1 application topically 4 (four) times daily as needed for irritation. 12/16/18  Yes Truitt Merle, MD  insulin detemir (LEVEMIR) 100 UNIT/ML injection Inject 0.4 mLs (40 Units total) into the skin at bedtime. 11/26/17  Yes Tat, Shanon Brow, MD  insulin lispro (HUMALOG) 100 UNIT/ML injection Inject 0-20 Units into the skin 3 (three) times daily with meals. Per sliding scale 01/03/19  Yes [provider]  levothyroxine (SYNTHROID, LEVOTHROID) 200 MCG tablet Take 1 tablet by mouth daily before breakfast.  06/24/18   Yes [provider]  nitroGLYCERIN (NITROSTAT) 0.4 MG SL tablet Place 0.4 mg under the tongue every 5 (five) minutes as needed for chest pain.  03/26/18  Yes [provider]  nystatin (MYCOSTATIN/NYSTOP) powder Apply topically 2 (two) times daily. 02/22/19  Yes Alla Feeling, NP  Omega-3 Fatty Acids (FISH OIL PO) Take 1 capsule by mouth daily.    Yes [provider]  ondansetron (ZOFRAN-ODT) 8  MG disintegrating tablet TAKE 1 TABLET BY MOUTH EVERY 8 HOURS AS NEEDED FOR NAUSEA OR VOMITING. 02/23/19  Yes Alla Feeling, NP  oxyCODONE-acetaminophen (PERCOCET) 10-325 MG tablet Take 1 tablet by mouth every 6 (six) hours as needed for pain. 04/21/19  Yes Truitt Merle, MD  OXYGEN Inhale 2 L into the lungs continuous.   Yes [provider]  pantoprazole (PROTONIX) 40 MG tablet Take 1 tablet (40 mg total) by mouth 2 (two) times daily. 10/07/18  Yes Truitt Merle, MD  polyethylene glycol (MIRALAX / GLYCOLAX) 17 g packet Take 17 g by mouth 2 (two) times daily. 04/14/19  Yes Adhikari, Tamsen Meek, MD  potassium chloride SA (KLOR-CON) 20 MEQ tablet Take 1 tablet (20 mEq total) by mouth 2 (two) times daily. 04/15/19  Yes Shelly Coss, MD  promethazine (PHENERGAN) 12.5 MG tablet Take 1 tablet (12.5 mg total) by mouth every 6 (six) hours as needed for nausea or vomiting. 02/22/19  Yes Alla Feeling, NP  senna (SENOKOT) 8.6 MG TABS tablet Take 1 tablet (8.6 mg total) by mouth daily. Patient taking differently: Take 2 tablets by mouth 2 (two) times daily. 2 tabs each AM, 2 tabs HS 04/15/19  Yes Adhikari, Tamsen Meek, MD  silver sulfADIAZINE (SILVADENE) 1 % cream Apply 1 application topically 2 (two) times daily.  01/03/19  Yes [provider]  sucralfate (CARAFATE) 1 g tablet Take 1 tablet (1 g total) by mouth 4 (four) times daily -  with meals and at bedtime. 5 min before meals for radiation induced esophagitis 12/03/18  Yes Tyler Pita, MD  SURE COMFORT INS SYR 1CC/28G 28G X 1/2" 1 ML  MISC USE TO INJECT INSULIN UP TO 4 TIMES DAILY. 03/06/17  Yes Cassandria Anger, MD  topiramate (TOPAMAX) 25 MG tablet Take 75 mg by mouth at bedtime.  03/18/18  Yes [provider]  umeclidinium-vilanterol (ANORO ELLIPTA) 62.5-25 MCG/INH AEPB Inhale 1 puff into the lungs daily. 05/26/17  Yes Stephanie Gravel, MD  insulin aspart (NOVOLOG) 100 UNIT/ML injection Inject 1-18 Units into the skin 3 (three) times daily with meals. Patient taking differently: Inject 1-18 Units into the skin 3 (three) times daily with meals. Per sliding scale 04/09/18   Johnson, Clanford L, MD  ondansetron (ZOFRAN) 8 MG tablet Take 1 tablet (8 mg total) by mouth every 8 (eight) hours as needed for nausea or vomiting. Patient not taking: Reported on 04/25/2019 12/15/18   Truitt Merle, MD  oxyCODONE (OXY IR/ROXICODONE) 5 MG immediate release tablet Take 1 tablet (5 mg total) by mouth every 12 (twelve) hours as needed for severe pain. Patient not taking: Reported on 04/25/2019 03/18/19   Kyung Rudd, MD    Physical Exam: Vitals:   04/25/19 1700 04/25/19 1730 04/25/19 1931 04/25/19 2042  BP: 113/74 (!) 136/56  126/72  Pulse: 98   90  Resp: 12   17  Temp: 97.7 F (36.5 C) 98 F (36.7 C)  98.3 F (36.8 C)  TempSrc: Oral Oral  Oral  SpO2: 95%  95% 100%  Weight:      Height:        Vitals:   04/25/19 1700 04/25/19 1730 04/25/19 1931 04/25/19 2042  BP: 113/74 (!) 136/56  126/72  Pulse: 98   90  Resp: 12   17  Temp: 97.7 F (36.5 C) 98 F (36.7 C)  98.3 F (36.8 C)  TempSrc: Oral Oral  Oral  SpO2: 95%  95% 100%  Weight:      Height:  Constitutional: NAD, AAOx3, obese HEENT: conjunctivae and lids normal, EOMI CV: RRR no M,R,G. Distal pulses +2.  No cyanosis.   RESP: CTA B/L over anterior, normal respiratory effort  GI: +BS, NTND Extremities: 1-2+ pitting edema in BLE SKIN: warm, dry.  Erythema in both lower legs Neuro: II - XII grossly intact.  Sensation intact Psych: Labile mood and affect.      Labs on Admission: I have personally reviewed following labs and imaging studies  CBC: Recent Labs  Lab 04/25/19 0841  WBC 4.5  HGB 6.6*  HCT 22.4*  MCV 104.2*  PLT 630   Basic Metabolic Panel: Recent Labs  Lab 04/25/19 0841  NA 135  K 5.9*  CL 100  CO2 25  GLUCOSE 375*  BUN 69*  CREATININE 2.39*  CALCIUM 9.0   GFR: Estimated Creatinine Clearance: 27.7 mL/min (A) (by C-G formula based on SCr of 2.39 mg/dL (H)). Liver Function Tests: No results for input(s): AST, ALT, ALKPHOS, BILITOT, PROT, ALBUMIN in the last 168 hours. No results for input(s): LIPASE, AMYLASE in the last 168 hours. No results for input(s): AMMONIA in the last 168 hours. Coagulation Profile: No results for input(s): INR, PROTIME in the last 168 hours. Cardiac Enzymes: No results for input(s): CKTOTAL, CKMB, CKMBINDEX, TROPONINI in the last 168 hours. BNP (last 3 results) No results for input(s): PROBNP in the last 8760 hours. HbA1C: No results for input(s): HGBA1C in the last 72 hours. CBG: Recent Labs  Lab 04/25/19 1659 04/25/19 2224  GLUCAP 310* 383*   Lipid Profile: No results for input(s): CHOL, HDL, LDLCALC, TRIG, CHOLHDL, LDLDIRECT in the last 72 hours. Thyroid Function Tests: No results for input(s): TSH, T4TOTAL, FREET4, T3FREE, THYROIDAB in the last 72 hours. Anemia Panel: No results for input(s): VITAMINB12, FOLATE, FERRITIN, TIBC, IRON, RETICCTPCT in the last 72 hours. Urine analysis:    Component Value Date/Time   COLORURINE YELLOW 04/25/2019 0801   APPEARANCEUR CLEAR 04/25/2019 0801   LABSPEC 1.010 04/25/2019 0801   PHURINE 5.0 04/25/2019 0801   GLUCOSEU >=500 (A) 04/25/2019 0801   HGBUR MODERATE (A) 04/25/2019 0801   BILIRUBINUR NEGATIVE 04/25/2019 0801   KETONESUR NEGATIVE 04/25/2019 0801   PROTEINUR NEGATIVE 04/25/2019 0801   UROBILINOGEN 0.2 12/04/2014 0650   NITRITE NEGATIVE 04/25/2019 0801   LEUKOCYTESUR NEGATIVE 04/25/2019 0801    Radiological Exams on  Admission: No results found.   Enzo Bi MD [facility]  If 7PM-7AM, please contact night-coverage 04/26/2019, 12:26 AM

## 2019-04-25 NOTE — ED Notes (Signed)
CRITICAL VALUE ALERT  Critical Value: Hemoglobin 6.6  Date & Time Notied: 04/25/2019 0920  Provider Notified: Dr. Tomi Bamberger   Orders Received/Actions taken: None yet

## 2019-04-25 NOTE — Consult Note (Addendum)
Referring Provider: Dr. Dorie Rank  Primary Care Physician:  [name], MD Primary Gastroenterologist:  Dr. Gala Romney   Date of Admission: 04/25/2019 Date of Consultation: 04/25/2019  Reason for Consultation:  Acute on chronic anemia, goals of care  HPI:  Stephanie Combs is a 68 y.o. 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 She was also diagnosed in May 2020 during evaluation for colon cancer with small cell lung cancer, and new brain metastasis in Oct 2020. Radiation therapy to brain in Nov 2020. Not a candidate for further chemo due to poor performance status and recommending palliative care. Oncologist (Dr. Truitt Merle) has encouraged considering hospice. Patient presented to ED today due to worsening weakness and desire to be placed in a skilled nursing facility. Hgb 6.6 on admission. Previously 8 early December. Heme positive. GI consulted for further any further recommendations.  Initially, patient was drowsy and would not answer my questions. After much prodding and mention of palliative care, she immediately awoke and was intentional with responses. She denies abdominal pain, N/V. Has poor appetite but wants to eat now and is listing out food that she wants for today. No dysphagia. She states PT has seen her and she was able to walk around the room. Occasional low-volume hematochezia, which she attributes to hemorrhoids. Doesn't feel weak any longer. She is adamant that she wants to have chemo when able. She notes that EMS had to come to her house multiple times previously to help her with ambulation and moving from toilet. She notes they had a meeting at her house regarding her multiple calls. She came to the ED so that she can have placement with SNF. She does not want to pursue hospice. She states she is not dying any time soon. Her only complaint now is hunger  and UTI symptoms.   2 units PRBCs have been ordered but not completed yet. COVID remains in process.      Past Medical History:  Diagnosis Date  . Anxiety   . CAD (coronary artery disease)    a. s/p DES to LAD and angioplasty to D1 in 05/2017  . Cancer ([place])   . CHF (congestive heart failure) ([facility])    Diastolic  . Chronic back pain   . Chronic pain   . COPD (chronic obstructive pulmonary disease) ([place])   . Degenerative disk disease   . Diabetes mellitus without complication ([place])   . History of medication noncompliance 11/2017   "the patient frequently self-adjusts medications without notifying her physicians"  . Hypertension   . Hypothyroidism   . On home O2    2.5 L N/C prn  . Pedal edema     Past Surgical History:  Procedure Laterality Date  . BIOPSY  09/09/2018   Procedure: BIOPSY;  Surgeon: Daneil Dolin, MD;  [facility];  Service: Endoscopy;;  gastric esophageal hepatic flexure colon  . COLONOSCOPY WITH PROPOFOL N/A 09/09/2018   hepatic flexure colon mass s/p biopsy, with polyps associated with tumor scattered all the way to the cecum not removed, multiple splenic flexure and rectal polyps removed, left-sided diverticulosis, ?right hemicolectomy. Hepatic flexure adenocarcinoma and additional adenocarcinoma in small splenic flexure polyp. Concern for synchronous colon cancer. High risk for surgical resection.   . CORONARY STENT INTERVENTION N/A 05/22/2017   Procedure: CORONARY STENT INTERVENTION;  Surgeon: Martinique, Peter M, MD;  Location: [facility] INVASIVE CV  LAB;  Service: Cardiovascular;  Laterality: N/A;  . ESOPHAGOGASTRODUODENOSCOPY (EGD) WITH PROPOFOL N/A 09/09/2018   abnormal distal esophagus suspicious for Barrett's s/p biopsy, erosive gastropathy, duodenal bulbar erosions. +Barrett's  . HERNIA REPAIR    . POLYPECTOMY  09/09/2018   Procedure: POLYPECTOMY;  Surgeon: Daneil Dolin, MD;  [facility]: [facility];  Service: Endoscopy;;  colon  . RIGHT/LEFT  HEART CATH AND CORONARY ANGIOGRAPHY N/A 05/19/2017   Procedure: RIGHT/LEFT HEART CATH AND CORONARY ANGIOGRAPHY;  Surgeon: Leonie Man, MD;  Location: [place] CV LAB;  Service: Cardiovascular;  Laterality: N/A;  . TUBAL LIGATION    . VIDEO BRONCHOSCOPY WITH ENDOBRONCHIAL ULTRASOUND Left 09/14/2018   Procedure: VIDEO BRONCHOSCOPY WITH ENDOBRONCHIAL ULTRASOUND with biopsies;  Surgeon: Margaretha Seeds, MD;  Location: [place];  Service: Thoracic;  Laterality: Left;    Prior to Admission medications   Medication Sig Start Date End Date Taking? Authorizing Provider  albuterol (PROAIR HFA) 108 (90 Base) MCG/ACT inhaler Inhale 2 puffs into the lungs every 4 (four) hours as needed for wheezing or shortness of breath. For shortness of breath 09/22/18   Eugenie Filler, MD  albuterol (PROVENTIL) (2.5 MG/3ML) 0.083% nebulizer solution Take 2.5 mg by nebulization 2 (two) times daily as needed for wheezing or shortness of breath.     [provider]  allopurinol (ZYLOPRIM) 300 MG tablet Take 300 mg by mouth daily.    [provider]  ALPRAZolam Duanne Moron) 1 MG tablet Take 1 tablet (1 mg total) by mouth 3 (three) times daily as needed for anxiety or sleep. 04/21/19   Truitt Merle, MD  aspirin EC 81 MG tablet Take 81 mg by mouth daily.    [provider]  atorvastatin (LIPITOR) 80 MG tablet Take 1 tablet (80 mg total) by mouth daily at 6 PM. 05/26/17   [name], MD  busPIRone (BUSPAR) 7.5 MG tablet Take 7.5 mg by mouth 2 (two) times daily. 03/10/19   [provider]  cholecalciferol (VITAMIN D3) 25 MCG (1000 UT) tablet Take 5,000 Units by mouth daily.    [provider]  diphenhydrAMINE-zinc acetate (BENADRYL) cream Apply 1 application topically 3 (three) times daily as needed for itching. 03/09/19   Truitt Merle, MD  diphenoxylate-atropine (LOMOTIL) 2.5-0.025 MG tablet Take 1 tablet by mouth 4 (four) times daily as needed for diarrhea or loose stools.  01/03/19    [provider]  furosemide (LASIX) 40 MG tablet Take 60 mg (1 1/2 tabs) in the morning and 40 mg (1 tab) in the afternoon Patient taking differently: Take 40 mg by mouth daily.  10/30/18   Danford, Suann Larry, MD  gabapentin (NEURONTIN) 600 MG tablet Take 600 mg by mouth 3 (three) times daily.     [provider]  hydrocortisone (ANUSOL-HC) 2.5 % rectal cream Place 1 application rectally 4 (four) times daily as needed for hemorrhoids.  01/03/19   [provider]  Hydrocortisone (GERHARDT'S BUTT CREAM) CREA Apply 1 application topically 4 (four) times daily as needed for irritation. 12/16/18   Truitt Merle, MD  insulin aspart (NOVOLOG) 100 UNIT/ML injection Inject 1-18 Units into the skin 3 (three) times daily with meals. 04/09/18   Johnson, Clanford L, MD  insulin detemir (LEVEMIR) 100 UNIT/ML injection Inject 0.4 mLs (40 Units total) into the skin at bedtime. 11/26/17   Orson Eva, MD  insulin lispro (HUMALOG) 100 UNIT/ML injection Inject 0-20 Units into the skin 3 (three) times daily with meals.  01/03/19   [provider]  levothyroxine (SYNTHROID, LEVOTHROID) 200 MCG tablet Take 1 tablet by mouth daily before breakfast.  06/24/18   [provider]  nitroGLYCERIN (NITROSTAT) 0.4 MG SL tablet Place 0.4 mg under the tongue every 5 (five) minutes as needed for chest pain.  03/26/18   [provider]  nystatin (MYCOSTATIN/NYSTOP) powder Apply topically 2 (two) times daily. 02/22/19   Alla Feeling, NP  Omega-3 Fatty Acids (FISH OIL PO) Take 1 capsule by mouth daily.     [provider]  ondansetron (ZOFRAN) 8 MG tablet Take 1 tablet (8 mg total) by mouth every 8 (eight) hours as needed for nausea or vomiting. 12/15/18   Truitt Merle, MD  ondansetron (ZOFRAN-ODT) 8 MG disintegrating tablet TAKE 1 TABLET BY MOUTH EVERY 8 HOURS AS NEEDED FOR NAUSEA OR VOMITING. 02/23/19   Alla Feeling, NP  oxyCODONE (OXY IR/ROXICODONE) 5 MG immediate release tablet  Take 1 tablet (5 mg total) by mouth every 12 (twelve) hours as needed for severe pain. 03/18/19   Kyung Rudd, MD  oxyCODONE-acetaminophen (PERCOCET) 10-325 MG tablet Take 1 tablet by mouth every 6 (six) hours as needed for pain. 04/21/19   Truitt Merle, MD  OXYGEN Inhale 2 L into the lungs continuous.    [provider]  pantoprazole (PROTONIX) 40 MG tablet Take 1 tablet (40 mg total) by mouth 2 (two) times daily. 10/07/18   Truitt Merle, MD  polyethylene glycol (MIRALAX / GLYCOLAX) 17 g packet Take 17 g by mouth 2 (two) times daily. 04/14/19   Shelly Coss, MD  potassium chloride SA (KLOR-CON) 20 MEQ tablet Take 1 tablet (20 mEq total) by mouth 2 (two) times daily. 04/15/19   Shelly Coss, MD  promethazine (PHENERGAN) 12.5 MG tablet Take 1 tablet (12.5 mg total) by mouth every 6 (six) hours as needed for nausea or vomiting. 02/22/19   Alla Feeling, NP  senna (SENOKOT) 8.6 MG TABS tablet Take 1 tablet (8.6 mg total) by mouth daily. 04/15/19   Shelly Coss, MD  silver sulfADIAZINE (SILVADENE) 1 % cream Apply 1 application topically 2 (two) times daily.  01/03/19   [provider]  sucralfate (CARAFATE) 1 g tablet Take 1 tablet (1 g total) by mouth 4 (four) times daily -  with meals and at bedtime. 5 min before meals for radiation induced esophagitis 12/03/18   Tyler Pita, MD  SURE COMFORT INS SYR 1CC/28G 28G X 1/2" 1 ML MISC USE TO INJECT INSULIN UP TO 4 TIMES DAILY. 03/06/17   Cassandria Anger, MD  topiramate (TOPAMAX) 25 MG tablet Take 75 mg by mouth at bedtime.  03/18/18   [provider]  umeclidinium-vilanterol (ANORO ELLIPTA) 62.5-25 MCG/INH AEPB Inhale 1 puff into the lungs daily. 05/26/17   [name], MD    Current Facility-Administered Medications  Medication Dose Route Frequency Provider Last Rate Last Admin  . 0.9 %  sodium chloride infusion  10 mL/hr Intravenous Once Dorie Rank, MD   Stopped at 04/25/19 1329   Current Outpatient Medications   Medication Sig Dispense Refill  . albuterol (PROAIR HFA) 108 (90 Base) MCG/ACT inhaler Inhale 2 puffs into the lungs every 4 (four) hours as needed for wheezing or shortness of breath. For shortness of breath 18 g 0  . albuterol (PROVENTIL) (2.5 MG/3ML) 0.083% nebulizer solution Take 2.5 mg by nebulization 2 (two) times daily as needed for wheezing or shortness of breath.     . allopurinol (ZYLOPRIM) 300 MG tablet Take 300 mg  by mouth daily.    Marland Kitchen ALPRAZolam (XANAX) 1 MG tablet Take 1 tablet (1 mg total) by mouth 3 (three) times daily as needed for anxiety or sleep. 90 tablet 0  . aspirin EC 81 MG tablet Take 81 mg by mouth daily.    Marland Kitchen atorvastatin (LIPITOR) 80 MG tablet Take 1 tablet (80 mg total) by mouth daily at 6 PM. 30 tablet 0  . busPIRone (BUSPAR) 7.5 MG tablet Take 7.5 mg by mouth 2 (two) times daily.    . cholecalciferol (VITAMIN D3) 25 MCG (1000 UT) tablet Take 5,000 Units by mouth daily.    . diphenhydrAMINE-zinc acetate (BENADRYL) cream Apply 1 application topically 3 (three) times daily as needed for itching. 28.4 g 2  . diphenoxylate-atropine (LOMOTIL) 2.5-0.025 MG tablet Take 1 tablet by mouth 4 (four) times daily as needed for diarrhea or loose stools.     . furosemide (LASIX) 40 MG tablet Take 60 mg (1 1/2 tabs) in the morning and 40 mg (1 tab) in the afternoon (Patient taking differently: Take 40 mg by mouth daily. ) 75 tablet 6  . gabapentin (NEURONTIN) 600 MG tablet Take 600 mg by mouth 3 (three) times daily.     . hydrocortisone (ANUSOL-HC) 2.5 % rectal cream Place 1 application rectally 4 (four) times daily as needed for hemorrhoids.     . Hydrocortisone (GERHARDT'S BUTT CREAM) CREA Apply 1 application topically 4 (four) times daily as needed for irritation. 1 each 3  . insulin aspart (NOVOLOG) 100 UNIT/ML injection Inject 1-18 Units into the skin 3 (three) times daily with meals.    . insulin detemir (LEVEMIR) 100 UNIT/ML injection Inject 0.4 mLs (40 Units total) into the  skin at bedtime. 10 mL 0  . insulin lispro (HUMALOG) 100 UNIT/ML injection Inject 0-20 Units into the skin 3 (three) times daily with meals.     Marland Kitchen levothyroxine (SYNTHROID, LEVOTHROID) 200 MCG tablet Take 1 tablet by mouth daily before breakfast.     . nitroGLYCERIN (NITROSTAT) 0.4 MG SL tablet Place 0.4 mg under the tongue every 5 (five) minutes as needed for chest pain.     Marland Kitchen nystatin (MYCOSTATIN/NYSTOP) powder Apply topically 2 (two) times daily. 30 g 0  . Omega-3 Fatty Acids (FISH OIL PO) Take 1 capsule by mouth daily.     . ondansetron (ZOFRAN) 8 MG tablet Take 1 tablet (8 mg total) by mouth every 8 (eight) hours as needed for nausea or vomiting. 120 tablet 2  . ondansetron (ZOFRAN-ODT) 8 MG disintegrating tablet TAKE 1 TABLET BY MOUTH EVERY 8 HOURS AS NEEDED FOR NAUSEA OR VOMITING. 30 tablet 0  . oxyCODONE (OXY IR/ROXICODONE) 5 MG immediate release tablet Take 1 tablet (5 mg total) by mouth every 12 (twelve) hours as needed for severe pain. 30 tablet 0  . oxyCODONE-acetaminophen (PERCOCET) 10-325 MG tablet Take 1 tablet by mouth every 6 (six) hours as needed for pain. 90 tablet 0  . OXYGEN Inhale 2 L into the lungs continuous.    . pantoprazole (PROTONIX) 40 MG tablet Take 1 tablet (40 mg total) by mouth 2 (two) times daily. 180 tablet 3  . polyethylene glycol (MIRALAX / GLYCOLAX) 17 g packet Take 17 g by mouth 2 (two) times daily. 30 each 0  . potassium chloride SA (KLOR-CON) 20 MEQ tablet Take 1 tablet (20 mEq total) by mouth 2 (two) times daily. 60 tablet 1  . promethazine (PHENERGAN) 12.5 MG tablet Take 1 tablet (12.5 mg total) by mouth every  6 (six) hours as needed for nausea or vomiting. 45 tablet 1  . senna (SENOKOT) 8.6 MG TABS tablet Take 1 tablet (8.6 mg total) by mouth daily. 120 tablet 0  . silver sulfADIAZINE (SILVADENE) 1 % cream Apply 1 application topically 2 (two) times daily.     . sucralfate (CARAFATE) 1 g tablet Take 1 tablet (1 g total) by mouth 4 (four) times daily -   with meals and at bedtime. 5 min before meals for radiation induced esophagitis 120 tablet 2  . SURE COMFORT INS SYR 1CC/28G 28G X 1/2" 1 ML MISC USE TO INJECT INSULIN UP TO 4 TIMES DAILY. 150 each 5  . topiramate (TOPAMAX) 25 MG tablet Take 75 mg by mouth at bedtime.     Marland Kitchen umeclidinium-vilanterol (ANORO ELLIPTA) 62.5-25 MCG/INH AEPB Inhale 1 puff into the lungs daily. 30 each 0    Allergies as of 04/25/2019 - Review Complete 04/25/2019  Allergen Reaction Noted  . Dilaudid [hydromorphone hcl] Itching 02/25/2012  . Midodrine hcl Swelling 11/30/2017  . Actifed cold-allergy [chlorpheniramine-phenylephrine]  11/24/2014  . Doxycycline Nausea And Vomiting 03/24/2015  . Other Cough 11/24/2014  . Penicillins Hives and Itching 11/24/2014  . Reglan [metoclopramide] Itching 02/25/2012  . Valium [diazepam] Itching 02/25/2012  . Vistaril [hydroxyzine hcl] Itching 02/25/2012    Family History  Problem Relation Age of Onset  . Stroke Mother        deceased 74   . Heart attack Father        deceased 56   . Diabetes Brother   . Cerebral palsy Brother   . Pneumonia Brother   . Diabetes Other   . Heart attack Other   . Colon cancer Neg Hx     Social History   Socioeconomic History  . Marital status: Widowed    Spouse name: Not on file  . Number of children: 3  . Years of education: Not on file  . Highest education level: Not on file  Occupational History    Comment: retired from [employer], worked Psychologist, sport and exercise  Tobacco Use  . Smoking status: Former Smoker    Packs/day: 1.50    Years: 45.00    Pack years: 67.50    Types: Cigarettes    Quit date: 11/24/2014    Years since quitting: 4.4  . Smokeless tobacco: Never Used  Substance and Sexual Activity  . Alcohol use: No    Alcohol/week: 0.0 standard drinks  . Drug use: No  . Sexual activity: Not Currently    Birth control/protection: Surgical  Other Topics Concern  . Not on file  Social History Narrative   Has 3 adopted children.  Two live in [place].   Social Determinants of Health   Financial Resource Strain:   . Difficulty of Paying Living Expenses: Not on file  Food Insecurity: Unknown  . Worried About Charity fundraiser in the Last Year: Patient refused  . Ran Out of Food in the Last Year: Patient refused  Transportation Needs: Unknown  . Lack of Transportation (Medical): Patient refused  . Lack of Transportation (Non-Medical): Patient refused  Physical Activity: Unknown  . Days of Exercise per Week: Patient refused  . Minutes of Exercise per Session: Patient refused  Stress: Unknown  . Feeling of Stress : Patient refused  Social Connections: Unknown  . Frequency of Communication with Friends and Family: Patient refused  . Frequency of Social Gatherings with Friends and Family: Patient refused  . Attends Religious Services: Patient refused  .  Active Member of Clubs or Organizations: Patient refused  . Attends Archivist Meetings: Patient refused  . Marital Status: Patient refused  Intimate Partner Violence: Unknown  . Fear of Current or Ex-Partner: Patient refused  . Emotionally Abused: Patient refused  . Physically Abused: Patient refused  . Sexually Abused: Patient refused    Review of Systems: As mentioned in HPI  Physical Exam: Vital signs in last 24 hours: Temp:  [98.3 F (36.8 C)] 98.3 F (36.8 C) (12/14 0744) Pulse Rate:  [68-85] 69 (12/14 1329) Resp:  [14-20] 14 (12/14 1329) BP: (99-137)/(35-56) 137/56 (12/14 1329) SpO2:  [94 %-99 %] 96 % (12/14 1329) Weight:  [673 kg] 129 kg (12/14 0743)   General:   Alert,  Chronically ill-appearing. Anxious.  Head:  Normocephalic and atraumatic. Eyes:  Sclera clear, no icterus.   Lungs:  Mild expiratory wheeze bilaterally Heart:  S1 S2 present without murmurs Abdomen:  Soft, nontender and nondistended. Obese with large pannus extending to mid thigh Rectal:  Deferred  Extremities:  Marked bilateral lower extremity lymphedema, 2+ pitting  edema Neurologic:  Alert and  oriented x4 Psych:  Flat affect  Intake/Output from previous day: No intake/output data recorded. Intake/Output this shift: Total I/O In: -  Out: 2000 [Urine:2000]  Lab Results: Recent Labs    04/25/19 0841  WBC 4.5  HGB 6.6*  HCT 22.4*  PLT 285   BMET Recent Labs    04/25/19 0841  NA 135  K 5.9*  CL 100  CO2 25  GLUCOSE 375*  BUN 69*  CREATININE 2.39*  CALCIUM 9.0    Impression: 68 year old female with unfortunate diagnoses of synchronous colon cancer (hepatic flexure and splenic flexure adenocarcinoma) diagnosed during hospitalization in April 2020 and not a surgical candidate, with small cell lung cancer later diagnosed in May 2020 during evaluation for colon cancer and found to have new brain metastasis in Oct 2020, presenting with worsening functional status, fatigue, acute on chronic anemia with Hgb 6.6, and heme positive stool. Oncology has referred for palliative care services and feels she is not a candidate for chemotherapy treatment at this time due to functional status. However, patient is adamant about seeking skilled nursing rehab and wants to undergo chemotherapy.   Acute on chronic anemia with heme positive stool: multifactorial in setting of known malignancies. She notes chronic low-volume hematochezia. Not a surgical candidate. Recommend blood transfusion as needed as long as she is not pursuing hospice.   Declining renal function: with hyperkalemia. Management per attending.  Highly recommend palliative care consultation during this admission. Hospice would be appropriate in this scenario, but she is unwilling to discuss hospice at this time. No role for endoscopy at this time, and options are limited from a GI perspective. As of note, EGD is also on file from earlier this year. Recommend supportive care and palliative care involvement.   Plan: Agree with blood transfusion Recommend palliative consultation while  inpatient Would focus on quality of life at this point in time Continue with Oncology follow-up No role for endoscopy at this time Diet as tolerated    Annitta Needs, PhD, ANP-BC [facility]     LOS: 0 days    04/25/2019, 1:48 PM

## 2019-04-25 NOTE — NC FL2 (Signed)
[place] LEVEL OF CARE SCREENING TOOL     IDENTIFICATION  Patient Name: Stephanie Combs Birthdate: [birth year] Sex: female Admission Date (Current Location): 04/25/2019  [place] and [place] Number:  [facility] [id number] [place] and Address:  [facility] [address] [place]      Provider Number: [id number]  Attending Physician Name and Address:  Enzo Bi, MD  Relative Name and Phone Number:  Silvano Rusk PH:[phone]    Current Level of Care:   Recommended Level of Care: [place] Prior Approval Number:    Date Approved/Denied:   PASRR Number:    Discharge Plan: SNF    Current Diagnoses: Patient Active Problem List   Diagnosis Date Noted  . Anemia due to GI blood loss 04/25/2019  . Pressure injury of skin 04/12/2019  . Fluid overload 04/10/2019  . Lung cancer ([place]) 04/10/2019  . Acute encephalopathy 04/02/2019  . Brain metastasis ([place]) 03/09/2019  . Acute on chronic diastolic heart failure ([place]) 10/11/2018  . Assistance needed with transportation 09/22/2018  . Primary cancer of hepatic flexure of colon ([place])   . Small cell lung cancer, left upper lobe ([place])   . Colonic mass 09/10/2018  . Colon polyps 09/10/2018  . Cellulitis of leg, left 09/10/2018  . Pain of lower extremity   . Non-intractable vomiting   . Abdominal pain, epigastric   . Heme positive stool   . Acute on chronic diastolic CHF (congestive heart failure) ([place]) 09/06/2018  . Symptomatic anemia 08/31/2018  . Anemia 08/30/2018  . Chronic pain 07/05/2018  . On home O2 07/05/2018  . Opioid dependence ([place]) 07/05/2018  . CAD (coronary artery disease) 05/14/2018  . AKI (acute kidney injury) ([place]) 04/05/2018  . Cellulitis 01/10/2018  . Cellulitis of right lower extremity 01/07/2018  . Altered mental status 01/06/2018  . Type 2 diabetes mellitus with hypoglycemia without coma ([place]) 11/24/2017  . Anxiety 11/24/2017  . Chronic respiratory  failure with hypoxia ([place]) 11/24/2017  . Syncope 10/24/2017  . Bradycardia   . Syncope due to orthostatic hypotension 09/04/2017  . Obesity, Class III, BMI 40-49.9 (morbid obesity) ([place]) 09/04/2017  . Abnormal nuclear stress test 05/19/2017  . Atypical angina ([place]) 05/19/2017  . Edema 05/15/2017  . Skin ulcer ([place]) 01/30/2017  . Tinea cruris 10/10/2016  . Vitamin D deficiency 08/19/2016  . Personal history of noncompliance with medical treatment, presenting hazards to health 06/17/2016  . CAP (community acquired pneumonia) 07/25/2015  . Pressure ulcer 05/02/2015  . Hyponatremia 05/01/2015  . Acute-on-chronic kidney injury ([place]) 05/01/2015  . Uncontrolled type 2 diabetes mellitus with stage 3 chronic kidney disease ([place]) 05/01/2015  . Cellulitis of foot, right 03/24/2015  . Peripheral edema 12/04/2014  . Acute renal failure superimposed on stage 3 chronic kidney disease ([place]) 11/24/2014  . Bilateral lower extremity edema   . Diabetic ulcer of right great toe ([place])   . Foot ulcer due to secondary DM ([place]) 11/07/2014  . Diabetic ulcer of right foot ([place]) 11/07/2014  . Edema of lower extremity 05/27/2013  . CKD (chronic kidney disease), stage III ([place]) 04/14/2013  . Anemia in CKD (chronic kidney disease) 04/14/2013  . Chest pain 04/14/2013  . Dyspnea 04/14/2013  . Chronic diastolic CHF (congestive heart failure) ([place])   . Hypothyroidism   . COPD (chronic obstructive pulmonary disease) ([place]) 08/07/2012  . Morbid obesity ([place]) 08/07/2012  . DM type 2 (diabetes mellitus, type 2) ([place]) 08/07/2012  . HTN (hypertension), benign 08/07/2012  .  ARF (acute renal failure) ([place]) 08/07/2012    Orientation RESPIRATION BLADDER Height & Weight     Self, Time, Situation, Place  O2(2L) Continent Weight: 284 lb 6.3 oz (129 kg) Height:  5' (152.4 cm)  BEHAVIORAL SYMPTOMS/MOOD NEUROLOGICAL BOWEL NUTRITION STATUS      Continent    AMBULATORY STATUS COMMUNICATION OF NEEDS Skin   Extensive Assist  Verbally Normal                       Personal Care Assistance Level of Assistance  Bathing, Feeding, Dressing Bathing Assistance: Limited assistance Feeding assistance: Independent Dressing Assistance: Limited assistance     Functional Limitations Info  Sight, Hearing, Speech Sight Info: Adequate Hearing Info: Adequate Speech Info: Adequate    SPECIAL CARE FACTORS FREQUENCY  PT (By licensed PT), OT (By licensed OT)     PT Frequency: 3x weekly OT Frequency: 3x weekly            Contractures Contractures Info: Not present    Additional Factors Info  Code Status, Allergies, Psychotropic Code Status Info: DNR Allergies Info: Dilaudid (Hydromorphone Hcl) Midodrine Hcl Actifed Cold-allergy (Chlorpheniramine-phenylephrine) Doxycycline Other Penicillins Reglan (Metoclopramide) Valium (Diazepam)  Vistaril (Hydroxyzine Hcl) Psychotropic Info: busPIRone (BUSPAR) tablet 7.5 mg, ALPRAZolam (XANAX) tablet 1 mg  PRN         Current Medications (04/25/2019):  This is the current hospital active medication list Current Facility-Administered Medications  Medication Dose Route Frequency Provider Last Rate Last Admin  . 0.9 %  sodium chloride infusion  10 mL/hr Intravenous Once Enzo Bi, MD   Stopped at 04/25/19 1329  . albuterol (PROVENTIL) (2.5 MG/3ML) 0.083% nebulizer solution 3 mL  3 mL Inhalation Q4H PRN Enzo Bi, MD      . allopurinol (ZYLOPRIM) tablet 300 mg  300 mg Oral Daily Enzo Bi, MD      . ALPRAZolam Duanne Moron) tablet 1 mg  1 mg Oral TID PRN Enzo Bi, MD      . busPIRone (BUSPAR) tablet 7.5 mg  7.5 mg Oral BID Enzo Bi, MD      . gabapentin (NEURONTIN) capsule 600 mg  600 mg Oral TID Enzo Bi, MD      . insulin detemir (LEVEMIR) injection 30 Units  30 Units Subcutaneous QHS Enzo Bi, MD      . Derrill Memo ON 04/26/2019] levothyroxine (SYNTHROID) tablet 200 mcg  200 mcg Oral [id number] Enzo Bi, MD      . ondansetron [facility]) tablet 8 mg  8 mg Oral Q8H PRN Enzo Bi, MD       . oxyCODONE-acetaminophen (PERCOCET/ROXICET) 5-325 MG per tablet 2 tablet  2 tablet Oral Q6H PRN Enzo Bi, MD      . pantoprazole (PROTONIX) EC tablet 40 mg  40 mg Oral BID Enzo Bi, MD      . polyethylene glycol (MIRALAX / GLYCOLAX) packet 17 g  17 g Oral BID Enzo Bi, MD      . senna [facility]) tablet 8.6 mg  1 tablet Oral Daily Enzo Bi, MD      . topiramate (TOPAMAX) tablet 75 mg  75 mg Oral QHS Enzo Bi, MD      . umeclidinium-vilanterol (ANORO ELLIPTA) 62.5-25 MCG/INH 1 puff  1 puff Inhalation Daily Enzo Bi, MD         Discharge Medications: Please see discharge summary for a list of discharge medications.  Relevant Imaging Results:  Relevant Lab Results:   Additional Information SS# [id number]  [place]  Meda Coffee, LCSW

## 2019-04-25 NOTE — ED Provider Notes (Addendum)
[facility] Provider Note   CSN: [id number] Arrival date & time: 04/25/19  0732     History Chief Complaint  Patient presents with  . Failure To [place] is a 68 y.o. female.  HPI   To the emergency room for evaluation of nursing home placement.  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  The patient has been getting progressively weaker.  Patient is currently not undergoing any chemotherapy.  Patient has been followed by palliative care, home health and they have been trying to get her placed in an inpatient rehab facility.  Patient had an office visit with her oncologist on December 10.  The patient and her current status is not a candidate for more chemotherapy.  Notes indicate that the patient requires a PT consult in order to qualify for skilled nursing facility placement.   Patient states she has been getting weaker over the last week.  She has had to call EMS several times to assist her with going to the commode and transferring around her house.  Patient has had significant lower extremity edema and is making it more difficult for her to walk.  She is becoming more forgetful.  She feels like she is getting more confused.  No fevers or chills.  No vomiting or diarrhea.  Past Medical History:  Diagnosis Date  . Anxiety   . CAD (coronary artery disease)    a. s/p DES to LAD and angioplasty to D1 in 05/2017  . Cancer ([place])   . CHF (congestive heart failure) (HCC)    Diastolic  . Chronic back pain   . Chronic pain   . COPD (chronic obstructive pulmonary disease) ([place])   . Degenerative disk disease   . Diabetes mellitus without complication ([place])   . History of medication noncompliance 11/2017   "the patient frequently self-adjusts medications without notifying her physicians"  . Hypertension   . Hypothyroidism   .  On home O2    2.5 L N/C prn  . Pedal edema     Patient Active Problem List   Diagnosis Date Noted  . Pressure injury of skin 04/12/2019  . Fluid overload 04/10/2019  . Lung cancer ([place]) 04/10/2019  . Acute encephalopathy 04/02/2019  . Brain metastasis ([place]) 03/09/2019  . Acute on chronic diastolic heart failure ([place]) 10/11/2018  . Assistance needed with transportation 09/22/2018  . Primary cancer of hepatic flexure of colon ([place])   . Small cell lung cancer, left upper lobe ([place])   . Colonic mass 09/10/2018  . Colon polyps 09/10/2018  . Cellulitis of leg, left 09/10/2018  . Pain of lower extremity   . Non-intractable vomiting   . Abdominal pain, epigastric   . Heme positive stool   . Acute on chronic diastolic CHF (congestive heart failure) ([place]) 09/06/2018  . Symptomatic anemia 08/31/2018  . Anemia 08/30/2018  . Chronic pain 07/05/2018  . On home O2 07/05/2018  . Opioid dependence ([place]) 07/05/2018  . CAD (coronary artery disease) 05/14/2018  . AKI (acute kidney injury) ([place]) 04/05/2018  . Cellulitis 01/10/2018  . Cellulitis of right lower extremity 01/07/2018  . Altered mental status 01/06/2018  . Type 2 diabetes mellitus with hypoglycemia without coma ([place]) 11/24/2017  . Anxiety 11/24/2017  . Chronic respiratory failure with hypoxia ([place]) 11/24/2017  . Syncope 10/24/2017  . Bradycardia   . Syncope due  to orthostatic hypotension 09/04/2017  . Obesity, Class III, BMI 40-49.9 (morbid obesity) ([place]) 09/04/2017  . Abnormal nuclear stress test 05/19/2017  . Atypical angina ([place]) 05/19/2017  . Edema 05/15/2017  . Skin ulcer ([place]) 01/30/2017  . Tinea cruris 10/10/2016  . Vitamin D deficiency 08/19/2016  . Personal history of noncompliance with medical treatment, presenting hazards to health 06/17/2016  . CAP (community acquired pneumonia) 07/25/2015  . Pressure ulcer 05/02/2015  . Hyponatremia 05/01/2015  . Acute-on-chronic kidney injury ([place]) 05/01/2015  . Uncontrolled  type 2 diabetes mellitus with stage 3 chronic kidney disease ([place]) 05/01/2015  . Cellulitis of foot, right 03/24/2015  . Peripheral edema 12/04/2014  . Acute renal failure superimposed on stage 3 chronic kidney disease ([place]) 11/24/2014  . Bilateral lower extremity edema   . Diabetic ulcer of right great toe ([place])   . Foot ulcer due to secondary DM ([place]) 11/07/2014  . Diabetic ulcer of right foot ([place]) 11/07/2014  . Edema of lower extremity 05/27/2013  . CKD (chronic kidney disease), stage III ([place]) 04/14/2013  . Anemia in CKD (chronic kidney disease) 04/14/2013  . Chest pain 04/14/2013  . Dyspnea 04/14/2013  . Chronic diastolic CHF (congestive heart failure) ([place])   . Hypothyroidism   . COPD (chronic obstructive pulmonary disease) ([place]) 08/07/2012  . Morbid obesity ([place]) 08/07/2012  . DM type 2 (diabetes mellitus, type 2) ([place]) 08/07/2012  . HTN (hypertension), benign 08/07/2012  . ARF (acute renal failure) (Monte[place]) 08/07/2012    Past Surgical History:  Procedure Laterality Date  . BIOPSY  09/09/2018   Procedure: BIOPSY;  Surgeon: Daneil Dolin, MD;  [facility];  Service: Endoscopy;;  gastric esophageal hepatic flexure colon  . COLONOSCOPY WITH PROPOFOL N/A 09/09/2018   Procedure: COLONOSCOPY WITH PROPOFOL;  Surgeon: Daneil Dolin, MD;  [facility];  Service: Endoscopy;  Laterality: N/A;  . CORONARY STENT INTERVENTION N/A 05/22/2017   Procedure: CORONARY STENT INTERVENTION;  Surgeon: Martinique, Peter M, MD;  Location: [facility] CV LAB;  Service: Cardiovascular;  Laterality: N/A;  . ESOPHAGOGASTRODUODENOSCOPY (EGD) WITH PROPOFOL N/A 09/09/2018   Procedure: ESOPHAGOGASTRODUODENOSCOPY (EGD) WITH PROPOFOL;  Surgeon: Daneil Dolin, MD;  [facility];  Service: Endoscopy;  Laterality: N/A;  . HERNIA REPAIR    . POLYPECTOMY  09/09/2018   Procedure: POLYPECTOMY;  Surgeon: Daneil Dolin, MD;  [facility];  Service: Endoscopy;;  colon  .  RIGHT/LEFT HEART CATH AND CORONARY ANGIOGRAPHY N/A 05/19/2017   Procedure: RIGHT/LEFT HEART CATH AND CORONARY ANGIOGRAPHY;  Surgeon: Leonie Man, MD;  Location: [facility];  Service: Cardiovascular;  Laterality: N/A;  . TUBAL LIGATION    . VIDEO BRONCHOSCOPY WITH ENDOBRONCHIAL ULTRASOUND Left 09/14/2018   Procedure: VIDEO BRONCHOSCOPY WITH ENDOBRONCHIAL ULTRASOUND with biopsies;  Surgeon: Margaretha Seeds, MD;  Location: [facility];  Service: Thoracic;  Laterality: Left;     OB History   No obstetric history on file.     Family History  Problem Relation Age of Onset  . Stroke Mother        deceased 63   . Heart attack Father        deceased 84   . Diabetes Brother   . Cerebral palsy Brother   . Pneumonia Brother   . Diabetes Other   . Heart attack Other   . Colon cancer Neg Hx     Social History   Tobacco Use  . Smoking status: Former Smoker  Packs/day: 1.50    Years: 45.00    Pack years: 67.50    Types: Cigarettes    Quit date: 11/24/2014    Years since quitting: 4.4  . Smokeless tobacco: Never Used  Substance Use Topics  . Alcohol use: No    Alcohol/week: 0.0 standard drinks  . Drug use: No    Home Medications Prior to Admission medications   Medication Sig Start Date End Date Taking? Authorizing Provider  albuterol (PROAIR HFA) 108 (90 Base) MCG/ACT inhaler Inhale 2 puffs into the lungs every 4 (four) hours as needed for wheezing or shortness of breath. For shortness of breath 09/22/18   Eugenie Filler, MD  albuterol (PROVENTIL) (2.5 MG/3ML) 0.083% nebulizer solution Take 2.5 mg by nebulization 2 (two) times daily as needed for wheezing or shortness of breath.     [provider]  allopurinol (ZYLOPRIM) 300 MG tablet Take 300 mg by mouth daily.    [provider]  ALPRAZolam Duanne Moron) 1 MG tablet Take 1 tablet (1 mg total) by mouth 3 (three) times daily as needed for anxiety or sleep. 04/21/19   Truitt Merle, MD  aspirin EC 81 MG tablet Take  81 mg by mouth daily.    [provider]  atorvastatin (LIPITOR) 80 MG tablet Take 1 tablet (80 mg total) by mouth daily at 6 PM. 05/26/17   [name], MD  busPIRone (BUSPAR) 7.5 MG tablet Take 7.5 mg by mouth 2 (two) times daily. 03/10/19   [provider]  cholecalciferol (VITAMIN D3) 25 MCG (1000 UT) tablet Take 5,000 Units by mouth daily.    [provider]  diphenhydrAMINE-zinc acetate (BENADRYL) cream Apply 1 application topically 3 (three) times daily as needed for itching. 03/09/19   Truitt Merle, MD  diphenoxylate-atropine (LOMOTIL) 2.5-0.025 MG tablet Take 1 tablet by mouth 4 (four) times daily as needed for diarrhea or loose stools.  01/03/19   [provider]  furosemide (LASIX) 40 MG tablet Take 60 mg (1 1/2 tabs) in the morning and 40 mg (1 tab) in the afternoon Patient taking differently: Take 40 mg by mouth daily.  10/30/18   Danford, Suann Larry, MD  gabapentin (NEURONTIN) 600 MG tablet Take 600 mg by mouth 3 (three) times daily.     [provider]  hydrocortisone (ANUSOL-HC) 2.5 % rectal cream Place 1 application rectally 4 (four) times daily as needed for hemorrhoids.  01/03/19   [provider]  Hydrocortisone (GERHARDT'S BUTT CREAM) CREA Apply 1 application topically 4 (four) times daily as needed for irritation. 12/16/18   Truitt Merle, MD  insulin aspart (NOVOLOG) 100 UNIT/ML injection Inject 1-18 Units into the skin 3 (three) times daily with meals. 04/09/18   Johnson, Clanford L, MD  insulin detemir (LEVEMIR) 100 UNIT/ML injection Inject 0.4 mLs (40 Units total) into the skin at bedtime. 11/26/17   Orson Eva, MD  insulin lispro (HUMALOG) 100 UNIT/ML injection Inject 0-20 Units into the skin 3 (three) times daily with meals.  01/03/19   [provider]  levothyroxine (SYNTHROID, LEVOTHROID) 200 MCG tablet Take 1 tablet by mouth daily before breakfast.  06/24/18   [provider]  nitroGLYCERIN (NITROSTAT) 0.4 MG SL  tablet Place 0.4 mg under the tongue every 5 (five) minutes as needed for chest pain.  03/26/18   [provider]  nystatin (MYCOSTATIN/NYSTOP) powder Apply topically 2 (two) times daily. 02/22/19   Alla Feeling, NP  Omega-3 Fatty Acids (FISH OIL PO) Take 1 capsule  by mouth daily.     [provider]  ondansetron (ZOFRAN) 8 MG tablet Take 1 tablet (8 mg total) by mouth every 8 (eight) hours as needed for nausea or vomiting. 12/15/18   Truitt Merle, MD  ondansetron (ZOFRAN-ODT) 8 MG disintegrating tablet TAKE 1 TABLET BY MOUTH EVERY 8 HOURS AS NEEDED FOR NAUSEA OR VOMITING. 02/23/19   Alla Feeling, NP  oxyCODONE (OXY IR/ROXICODONE) 5 MG immediate release tablet Take 1 tablet (5 mg total) by mouth every 12 (twelve) hours as needed for severe pain. 03/18/19   Kyung Rudd, MD  oxyCODONE-acetaminophen (PERCOCET) 10-325 MG tablet Take 1 tablet by mouth every 6 (six) hours as needed for pain. 04/21/19   Truitt Merle, MD  OXYGEN Inhale 2 L into the lungs continuous.    [provider]  pantoprazole (PROTONIX) 40 MG tablet Take 1 tablet (40 mg total) by mouth 2 (two) times daily. 10/07/18   Truitt Merle, MD  polyethylene glycol (MIRALAX / GLYCOLAX) 17 g packet Take 17 g by mouth 2 (two) times daily. 04/14/19   Shelly Coss, MD  potassium chloride SA (KLOR-CON) 20 MEQ tablet Take 1 tablet (20 mEq total) by mouth 2 (two) times daily. 04/15/19   Shelly Coss, MD  promethazine (PHENERGAN) 12.5 MG tablet Take 1 tablet (12.5 mg total) by mouth every 6 (six) hours as needed for nausea or vomiting. 02/22/19   Alla Feeling, NP  senna (SENOKOT) 8.6 MG TABS tablet Take 1 tablet (8.6 mg total) by mouth daily. 04/15/19   Shelly Coss, MD  silver sulfADIAZINE (SILVADENE) 1 % cream Apply 1 application topically 2 (two) times daily.  01/03/19   [provider]  sucralfate (CARAFATE) 1 g tablet Take 1 tablet (1 g total) by mouth 4 (four) times daily -  with meals and at bedtime. 5 min before  meals for radiation induced esophagitis 12/03/18   Tyler Pita, MD  SURE COMFORT INS SYR 1CC/28G 28G X 1/2" 1 ML MISC USE TO INJECT INSULIN UP TO 4 TIMES DAILY. 03/06/17   Cassandria Anger, MD  topiramate (TOPAMAX) 25 MG tablet Take 75 mg by mouth at bedtime.  03/18/18   [provider]  umeclidinium-vilanterol (ANORO ELLIPTA) 62.5-25 MCG/INH AEPB Inhale 1 puff into the lungs daily. 05/26/17   [name], MD    Allergies    Dilaudid [hydromorphone hcl], Midodrine hcl, Actifed cold-allergy [chlorpheniramine-phenylephrine], Doxycycline, Other, Penicillins, Reglan [metoclopramide], Valium [diazepam], and Vistaril [hydroxyzine hcl]  Review of Systems   Review of Systems  All other systems reviewed and are negative.   Physical Exam Updated Vital Signs BP (!) 111/53   Pulse 85   Temp 98.3 F (36.8 C) (Oral)   Resp 18   Ht 1.524 m (5')   Wt 129 kg   SpO2 99%   BMI 55.54 kg/m   Physical Exam Vitals and nursing note reviewed.  Constitutional:      General: She is not in acute distress.    Appearance: She is well-developed. She is obese. She is ill-appearing.  HENT:     Head: Normocephalic and atraumatic.     Right Ear: External ear normal.     Left Ear: External ear normal.  Eyes:     General: No scleral icterus.       Right eye: No discharge.        Left eye: No discharge.     Conjunctiva/sclera: Conjunctivae normal.  Neck:     Trachea: No tracheal deviation.  Cardiovascular:  Rate and Rhythm: Normal rate and regular rhythm.  Pulmonary:     Effort: Pulmonary effort is normal. No respiratory distress.     Breath sounds: Normal breath sounds. No stridor. No wheezing or rales.  Abdominal:     General: Bowel sounds are normal. There is no distension.     Palpations: Abdomen is soft.     Tenderness: There is no abdominal tenderness. There is no guarding or rebound.  Musculoskeletal:        General: Swelling present. No tenderness.     Cervical back: Neck  supple.     Right lower leg: Edema present.     Left lower leg: Edema present.     Comments: Tense edema of the bilateral lower extremities up to the thighs, no erythema  Skin:    General: Skin is warm and dry.     Findings: No rash.  Neurological:     Mental Status: She is alert.     Cranial Nerves: No cranial nerve deficit (no facial droop, extraocular movements intact, no slurred speech).     Sensory: No sensory deficit.     Motor: Weakness present. No abnormal muscle tone or seizure activity.     Coordination: Coordination normal.     Comments: Patient is able to move her arms easily, she is able to move both feet but has difficulty if doing either leg off the bed, answers questions appropriately, no slurred speech  Psychiatric:        Speech: Speech is tangential.     ED Results / Procedures / Treatments   Labs (all labs ordered are listed, but only abnormal results are displayed) Labs Reviewed  CBC - Abnormal; Notable for the following components:      Result Value   RBC 2.15 (*)    Hemoglobin 6.6 (*)    HCT 22.4 (*)    MCV 104.2 (*)    MCHC 29.5 (*)    RDW 15.6 (*)    All other components within normal limits  BASIC METABOLIC PANEL - Abnormal; Notable for the following components:   Potassium 5.9 (*)    Glucose, Bld 375 (*)    BUN 69 (*)    Creatinine, Ser 2.39 (*)    GFR calc non Af Amer 20 (*)    GFR calc Af Amer 23 (*)    All other components within normal limits  POC OCCULT BLOOD, ED - Abnormal; Notable for the following components:   Fecal Occult Bld POSITIVE (*)    All other components within normal limits  SARS CORONAVIRUS 2 (TAT 6-24 HRS)  URINALYSIS, ROUTINE W REFLEX MICROSCOPIC  TYPE AND SCREEN  PREPARE RBC (CROSSMATCH)    EKG None  Radiology No results found.  Procedures .Critical Care Performed by: Dorie Rank, MD Authorized by: Dorie Rank, MD   Critical care provider statement:    Critical care time (minutes):  45   Critical care was  time spent personally by me on the following activities:  Discussions with consultants, evaluation of patient's response to treatment, examination of patient, ordering and performing treatments and interventions, ordering and review of laboratory studies, ordering and review of radiographic studies, pulse oximetry, re-evaluation of patient's condition, obtaining history from patient or surrogate and review of old charts   (including critical care time)  Medications Ordered in ED Medications  0.9 %  sodium chloride infusion (has no administration in time range)    ED Course  I have reviewed the triage vital signs  and the nursing notes.  Pertinent labs & imaging results that were available during my care of the patient were reviewed by me and considered in my medical decision making (see chart for details).  Clinical Course as of Apr 24 1012  Mon Apr 25, 2019  0918 Laboratory tests notable for worsening BUN and creatinine.  Potassium also elevated compared to last.  Blood sugar also elevated.   [QJ]  1941 Notified of decreased hgb.  Will guaic stool   [JK]  1007 Patient's labs are notable for a hemoglobin of 6.6.  Hemoccult stool is positive for blood   [JK]    Clinical Course User Index [JK] Dorie Rank, MD   MDM Rules/Calculators/A&P                       Patient presented to the ED for evaluation of worsening weakness.  Laboratory tests are notable for several new abnormalities.  Patient has evidence of acute kidney injury.  She also has anemia with guaiac positive stools.  Patient appears to be having a GI bleed.  Patient's overall prognosis is poor with her known metastatic cancer.  I have ordered blood transfusions.  I will consult the medical service for admission..  I will consult with gastroenterology.  Palliative care service and oncology will likely need to be involved.  Final Clinical Impression(s) / ED Diagnoses Final diagnoses:  Gastrointestinal hemorrhage, unspecified  gastrointestinal hemorrhage type      Dorie Rank, MD 04/25/19 1015  Discussed with GI, will consult on pt   Dorie Rank, MD 04/25/19 1047

## 2019-04-25 NOTE — ED Triage Notes (Signed)
Pt states she is unable to take care of herself at home. Has been calling ems up to 4 times per day to help her toilet. Crying at triage stating she needs to go to [facility].

## 2019-04-25 NOTE — Telephone Encounter (Signed)
Scheduled appt per 12/12 sch message - unable to reach pt . Left message with appt date and time .

## 2019-04-25 NOTE — ED Notes (Signed)
Have ordered pt a meal tray

## 2019-04-26 LAB — HEMOGLOBIN AND HEMATOCRIT, BLOOD
HCT: 24.9 % — ABNORMAL LOW (ref 36.0–46.0)
Hemoglobin: 7.4 g/dL — ABNORMAL LOW (ref 12.0–15.0)

## 2019-04-26 LAB — GLUCOSE, CAPILLARY
Glucose-Capillary: 283 mg/dL — ABNORMAL HIGH (ref 70–99)
Glucose-Capillary: 335 mg/dL — ABNORMAL HIGH (ref 70–99)
Glucose-Capillary: 368 mg/dL — ABNORMAL HIGH (ref 70–99)

## 2019-04-26 LAB — HEPATIC FUNCTION PANEL
ALT: 11 U/L (ref 0–44)
AST: 15 U/L (ref 15–41)
Albumin: 2.7 g/dL — ABNORMAL LOW (ref 3.5–5.0)
Alkaline Phosphatase: 77 U/L (ref 38–126)
Bilirubin, Direct: 0.1 mg/dL (ref 0.0–0.2)
Indirect Bilirubin: 0.5 mg/dL (ref 0.3–0.9)
Total Bilirubin: 0.6 mg/dL (ref 0.3–1.2)
Total Protein: 5.6 g/dL — ABNORMAL LOW (ref 6.5–8.1)

## 2019-04-26 LAB — IRON AND TIBC
Iron: 54 ug/dL (ref 28–170)
Saturation Ratios: 17 % (ref 10.4–31.8)
TIBC: 312 ug/dL (ref 250–450)
UIBC: 258 ug/dL

## 2019-04-26 LAB — BASIC METABOLIC PANEL
Anion gap: 9 (ref 5–15)
BUN: 53 mg/dL — ABNORMAL HIGH (ref 8–23)
CO2: 25 mmol/L (ref 22–32)
Calcium: 8.6 mg/dL — ABNORMAL LOW (ref 8.9–10.3)
Chloride: 101 mmol/L (ref 98–111)
Creatinine, Ser: 1.8 mg/dL — ABNORMAL HIGH (ref 0.44–1.00)
GFR calc Af Amer: 33 mL/min — ABNORMAL LOW (ref >60)
GFR calc non Af Amer: 28 mL/min — ABNORMAL LOW (ref >60)
Glucose, Bld: 346 mg/dL — ABNORMAL HIGH (ref 70–99)
Potassium: 5.2 mmol/L — ABNORMAL HIGH (ref 3.5–5.1)
Sodium: 135 mmol/L (ref 135–145)

## 2019-04-26 LAB — MAGNESIUM: Magnesium: 2 mg/dL (ref 1.7–2.4)

## 2019-04-26 MED ORDER — GERHARDT'S BUTT CREAM
1.0000 "application " | TOPICAL_CREAM | Freq: Four times a day (QID) | CUTANEOUS | Status: DC | PRN
  Administered 2019-04-27 (×3): 1 via TOPICAL
  Filled 2019-04-26: qty 1

## 2019-04-26 MED ORDER — ONDANSETRON HCL 4 MG/2ML IJ SOLN: 4.0000 mg | Freq: Four times a day (QID) | INTRAMUSCULAR | Status: DC | PRN

## 2019-04-26 MED ORDER — HYDROCORTISONE (PERIANAL) 2.5 % EX CREA
1.0000 "application " | TOPICAL_CREAM | Freq: Four times a day (QID) | CUTANEOUS | Status: DC | PRN
  Administered 2019-04-27 (×2): 1 via RECTAL
  Filled 2019-04-26: qty 28.35

## 2019-04-26 NOTE — Progress Notes (Signed)
PROGRESS NOTE    Stephanie Combs  WUJ:[id number] DOB: 04/23/1951 DOA: 04/25/2019 PCP: [name], MD    Assessment & Plan:   Active Problems:   Anemia due to GI blood loss   Stephanie Combs is a 68 y.o. 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   # Acute on chronic anemia likely due to GI bleed # Hx of iron def --Hgb 6.6, MCV 104.2 on presentation (Hgb 9-11 in Nov 2020).  Worsening anemia likely due to GI blood loss since pt has known colon cancer, and fecal occult positive. --Recent Vit B12 and folate wnl.  Iron was def 8 months ago, however, currently wnl. --1u pRBC started in the ED. PLAN: --Transfuse to keep Hgb >=7  # AKI on CKD 3b, improved --Cr 2.39 on presentation, up from 1.42 about a week ago.  May be due to over-diuresis at home.   --Received 1L LR in the ED --Cr improved to 1.8 today --Hold further MIVF  --Hold home lasix for now  # Weakness and inability to transfer --PT/OT for likely rehab placement  # BLE edema and anasarca --Lasix at home regimen, 60 mg in the morning and 40 mg at night.   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 Burr Medico.Status post concurrent chemoradiation for small cell lung cancer, status post whole brain radiation. She is not a surgical candidatefor colon cancer.  --Not a candidate for further treatment --Palliative care already involved  # DM2 on insulin --continue home Levemir as reduced dose of 30u nightly  # Chronic pain on chronic opioids # Neuropathy --continue home opioids --continue home gabapentin --continue home bowel regimen  # Anxiety and depression --continue home Xanax and Buspar  # COPD, stable # Chronic hypoxic  respiratory failure on 2-3L at home  --continue home suppl O2 --continue home Anoro  Hypothyroidism:Continue Synthyroid  Morbid obesity: BMI of 44.6.  Skin irritation between gluteal folds Hemorrhoids --Order Gerhardt's butt cream PRN, per pt's request --Anusol PRN, per pt's request   DVT prophylaxis: SCD/Compression stockings Code Status: DNR  Family Communication: none  Disposition Plan: SNF rehab when bed available   Subjective and Interval History:  Pt complained of pain and irritation in her perineum and vaginal area from contact with stools, and wanted the cream she got during her last hospitalization.  Pt also wanted IV zofran, Prilosec (already ordered protonix), and whatever she may think of later.  Pt was frustrated that she has not been discharged to [facility] already, so that she could start her PT, gets stronger, and return home before Christmas.  No fever, dyspnea, chest pain, abdominal pain, N/V, dysuria.   Objective: Vitals:   04/26/19 0645 04/26/19 1030 04/26/19 1955 04/26/19 2155  BP: (!) 124/59   (!) 136/49  Pulse: 92   99  Resp: 18   20  Temp: 99.1 F (37.3 C)   98.5 F (36.9 C)  TempSrc: Oral   Oral  SpO2: 99% 98% 92% 94%  Weight:      Height:        Intake/Output Summary (Last 24 hours) at 04/27/2019 9562 Last data filed at 04/26/2019 2156 Gross per 24 hour  Intake 240 ml  Output 100 ml  Net  140 ml   Filed Weights   04/25/19 0743 04/26/19 0609  Weight: 129 kg 132.6 kg    Examination:   Constitutional: NAD, AAOx3, obese HEENT: conjunctivae and lids normal, EOMI CV: RRR no M,R,G. Distal pulses +2.  No cyanosis.   RESP: CTA B/L over anterior, normal respiratory effort  GI: +BS, NTND Extremities: 1-2+ pitting edema in BLE SKIN: warm, dry.  Erythema in both lower legs Neuro: II - XII grossly intact.  Sensation intact Psych: Labile mood and affect.    Data Reviewed: I have personally reviewed following labs and imaging  studies  CBC: Recent Labs  Lab 04/25/19 0841 04/26/19 0034  WBC 4.5  --   HGB 6.6* 7.4*  HCT 22.4* 24.9*  MCV 104.2*  --   PLT 285  --    Basic Metabolic Panel: Recent Labs  Lab 04/25/19 0841 04/26/19 0608  NA 135 135  K 5.9* 5.2*  CL 100 101  CO2 25 25  GLUCOSE 375* 346*  BUN 69* 53*  CREATININE 2.39* 1.80*  CALCIUM 9.0 8.6*  MG  --  2.0   GFR: Estimated Creatinine Clearance: 37.4 mL/min (A) (by C-G formula based on SCr of 1.8 mg/dL (H)). Liver Function Tests: Recent Labs  Lab 04/26/19 0608  AST 15  ALT 11  ALKPHOS 77  BILITOT 0.6  PROT 5.6*  ALBUMIN 2.7*   No results for input(s): LIPASE, AMYLASE in the last 168 hours. No results for input(s): AMMONIA in the last 168 hours. Coagulation Profile: No results for input(s): INR, PROTIME in the last 168 hours. Cardiac Enzymes: No results for input(s): CKTOTAL, CKMB, CKMBINDEX, TROPONINI in the last 168 hours. BNP (last 3 results) No results for input(s): PROBNP in the last 8760 hours. HbA1C: No results for input(s): HGBA1C in the last 72 hours. CBG: Recent Labs  Lab 04/25/19 1659 04/25/19 2224 04/26/19 0749 04/26/19 1116 04/26/19 2233  GLUCAP 310* 383* 283* 335* 368*   Lipid Profile: No results for input(s): CHOL, HDL, LDLCALC, TRIG, CHOLHDL, LDLDIRECT in the last 72 hours. Thyroid Function Tests: No results for input(s): TSH, T4TOTAL, FREET4, T3FREE, THYROIDAB in the last 72 hours. Anemia Panel: Recent Labs    04/26/19 0608  TIBC 312  IRON 54   Sepsis Labs: No results for input(s): PROCALCITON, LATICACIDVEN in the last 168 hours.  Recent Results (from the past 240 hour(s))  SARS CORONAVIRUS 2 (TAT 6-24 HRS) Nasopharyngeal Nasopharyngeal Swab     Status: None   Collection Time: 04/25/19 10:08 AM   Specimen: Nasopharyngeal Swab  Result Value Ref Range Status   SARS Coronavirus 2 NEGATIVE NEGATIVE Final    Comment: (NOTE) SARS-CoV-2 target nucleic acids are NOT DETECTED. The SARS-CoV-2 RNA  is generally detectable in upper and lower respiratory specimens during the acute phase of infection. Negative results do not preclude SARS-CoV-2 infection, do not rule out co-infections with other pathogens, and should not be used as the sole basis for treatment or other patient management decisions. Negative results must be combined with clinical observations, patient history, and epidemiological information. The expected result is Negative. Fact Sheet for Patients: SugarRoll.be Fact Sheet for Healthcare Providers: https://www.woods-mathews.com/ This test is not yet approved or cleared by the [place] FDA and  has been authorized for detection and/or diagnosis of SARS-CoV-2 by FDA under an Emergency Use Authorization (EUA). This EUA will remain  in effect (meaning this test can be used) for the duration of the COVID-19 declaration under Section 56 4(b)(1) of the Act, 21  U.S.C. section 360bbb-3(b)(1), unless the authorization is terminated or revoked sooner. Performed at [facility], [place] [address] [place], [place] [address]       Radiology Studies: No results found.   Scheduled Meds: . allopurinol  300 mg Oral Daily  . busPIRone  7.5 mg Oral BID  . gabapentin  600 mg Oral TID  . insulin detemir  30 Units Subcutaneous QHS  . levothyroxine  200 mcg Oral Q0600  . pantoprazole  40 mg Oral BID  . polyethylene glycol  17 g Oral BID  . senna  1 tablet Oral Daily  . topiramate  75 mg Oral QHS  . umeclidinium-vilanterol  1 puff Inhalation Daily   Continuous Infusions: . sodium chloride Stopped (04/25/19 1329)     LOS: 2 days     Enzo Bi, MD [facility] If 7PM-7AM, please contact night-coverage 04/27/2019, 2:12 AM

## 2019-04-26 NOTE — Evaluation (Signed)
Occupational Therapy Evaluation Patient Details Name: Stephanie Combs MRN: [id number] DOB: 06/09/1950 Today's Date: 04/26/2019    History of Present Illness To the emergency room for evaluation of nursing home placement.  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  The patient has been getting progressively weaker.  Patient is currently not undergoing any chemotherapy.  Patient has been followed by palliative care, home health and they have been trying to get her placed in an inpatient rehab facility.  Patient had an office visit with her oncologist on December 10.  The patient and her current status is not a candidate for more chemotherapy.  Notes indicate that the patient requires a PT consult in order to qualify for skilled nursing facility placement.   Patient states she has been getting weaker over the last week.  She has had to call EMS several times to assist her with going to the commode and transferring around her house.  Patient has had significant lower extremity edema and is making it more difficult for her to walk.  She is becoming more forgetful.  She feels like she is getting more confused.   Clinical Impression   Pt agreeable to OT evaluation, asking for assistance to use restroom this am. Pt requiring min guard for transfer to commode and supervision for hygiene. Pt then completing hand hygiene seated in chair with set-up. Pt emotionally labile during session, going from happy to mad to crying and stating she wants rehab but wants to go home for Christmas. Per chart review pt has family and personal care attendant for assistance however during session pt reports she has "no one." Pt is limited in ADL completion due to weakness and poor activity tolerance, recommend SNF on discharge to improve safety and independence during functional tasks.     Follow Up  Recommendations  SNF    Equipment Recommendations  None recommended by OT       Precautions / Restrictions Precautions Precautions: Fall Restrictions Weight Bearing Restrictions: No      Mobility Bed Mobility Overal bed mobility: Needs Assistance Bed Mobility: Supine to Sit     Supine to sit: Min assist;Mod assist     General bed mobility comments: slow labored movement  Transfers Overall transfer level: Needs assistance Equipment used: Rolling walker (2 wheeled) Transfers: Sit to/from [facility] Sit to Stand: Min assist Stand pivot transfers: Min guard;Min assist                ADL either performed or assessed with clinical judgement   ADL Overall ADL's : Needs assistance/impaired     Grooming: Wash/dry hands;Set up;Sitting                   Toilet Transfer: Min guard;Ambulation;Regular Toilet;RW   Toileting- Clothing Manipulation and Hygiene: Supervision/safety;Sitting/lateral lean       Functional mobility during ADLs: Min guard;Rolling walker       Vision Baseline Vision/History: No visual deficits Patient Visual Report: No change from baseline Vision Assessment?: No apparent visual deficits            Pertinent Vitals/Pain Pain Assessment: Faces Faces Pain Scale: Hurts little more Pain Location: left hip/buttocks Pain Descriptors / Indicators: Burning;Sore Pain Intervention(s): Limited activity within patient's tolerance;Monitored during session;Repositioned;Patient requesting pain meds-RN notified     Hand Dominance Right   Extremity/Trunk Assessment Upper Extremity Assessment Upper Extremity Assessment: Generalized weakness  Lower Extremity Assessment Lower Extremity Assessment: Defer to PT evaluation   Cervical / Trunk Assessment Cervical / Trunk Assessment: Kyphotic   Communication Communication Communication: No difficulties   Cognition Arousal/Alertness: Awake/alert Behavior During Therapy: WFL  for tasks assessed/performed Overall Cognitive Status: Within Functional Limits for tasks assessed                                                Home Living Family/patient expects to be discharged to:: Private residence Living Arrangements: Alone Available Help at Discharge: Family;Available PRN/intermittently Type of Home: Mobile home Home Access: Ramped entrance     Home Layout: One level     Bathroom Shower/Tub: Teacher, early years/pre: Handicapped height     Home Equipment: Environmental consultant - 2 wheels;Walker - 4 wheels;Bedside commode;Shower seat;Wheelchair - manual   Additional Comments: hs rollator but prefers RW for stability at this time      Prior Functioning/Environment Level of Independence: Needs assistance  Gait / Transfers Assistance Needed: household ambulator with RW ADL's / Homemaking Assistance Needed: assisted by family and personal care attendant            OT Problem List: Decreased strength;Decreased activity tolerance;Impaired balance (sitting and/or standing);Impaired UE functional use       End of Session Equipment Utilized During Treatment: Gait belt;Rolling walker  Activity Tolerance: Patient tolerated treatment well Patient left: in chair;with call bell/phone within reach;with nursing/sitter in room  OT Visit Diagnosis: Muscle weakness (generalized) (M62.81)                Time: 4174-0814 OT Time Calculation (min): 26 min Charges:  OT General Charges $OT Visit: 1 Visit OT Evaluation $OT Eval Moderate Complexity: [place], OTR/L  [phone] 04/26/2019, 8:30 AM

## 2019-04-26 NOTE — TOC Progression Note (Signed)
Transition of Care [facility]) - Progression Note    Patient Details  Name: Stephanie Combs MRN: [id number] Date of Birth: Nov 13, 1950  Transition of Care [facility]) CM/SW Contact  Zebediah Beezley, Chauncey Reading, RN Phone Number: 04/26/2019, 11:56 AM  Clinical Narrative:   Discussed with Joni Fears, she has submitted for insurance authorization. PASSR done today and is pending.      Social Determinants of Health (SDOH) Interventions    Readmission Risk Interventions Readmission Risk Prevention Plan 04/11/2019 10/13/2018 09/02/2018  Transportation Screening Complete Complete -  Medication Review Press photographer) Complete Complete -  PCP or Specialist appointment within 3-5 days of discharge Complete (No Data) Complete  PCP/Specialist Appt Not Complete comments - Recent admit; remains acutely ill -  HRI or Home Care Consult Complete Complete -  SW Recovery Care/Counseling Consult Not Complete Complete -  SW Consult Not Complete Comments not appropriate - -  Palliative Care Screening Not Applicable Not Applicable -  [place] Not Applicable Not Applicable -  Some recent data might be hidden

## 2019-04-26 NOTE — Plan of Care (Signed)

## 2019-04-26 NOTE — Hospital Discharge Follow-Up (Signed)
Stephanie Combs is currently receiving in home  palliative care through [facility].  Patient has had conversation with her palliative care SW and is stating she needs SNF/rehab. Please contact me if I can be of any assistance in discharge planning.  Dimas Aguas, RN Clinical Nurse Liaison [facility] [phone] cell

## 2019-04-27 DIAGNOSIS — Z515 Encounter for palliative care: Secondary | ICD-10-CM

## 2019-04-27 DIAGNOSIS — Z7189 Other specified counseling: Secondary | ICD-10-CM

## 2019-04-27 DIAGNOSIS — Z66 Do not resuscitate: Secondary | ICD-10-CM

## 2019-04-27 DIAGNOSIS — C3490 Malignant neoplasm of unspecified part of unspecified bronchus or lung: Secondary | ICD-10-CM

## 2019-04-27 LAB — CBC
HCT: 22.2 % — ABNORMAL LOW (ref 36.0–46.0)
Hemoglobin: 6.7 g/dL — CL (ref 12.0–15.0)
MCH: 30.7 pg (ref 26.0–34.0)
MCHC: 30.2 g/dL (ref 30.0–36.0)
MCV: 101.8 fL — ABNORMAL HIGH (ref 80.0–100.0)
Platelets: 225 10*3/uL (ref 150–400)
RBC: 2.18 MIL/uL — ABNORMAL LOW (ref 3.87–5.11)
RDW: 17.2 % — ABNORMAL HIGH (ref 11.5–15.5)
WBC: 4.2 10*3/uL (ref 4.0–10.5)
nRBC: 0 % (ref 0.0–0.2)

## 2019-04-27 LAB — BASIC METABOLIC PANEL
Anion gap: 8 (ref 5–15)
BUN: 40 mg/dL — ABNORMAL HIGH (ref 8–23)
CO2: 28 mmol/L (ref 22–32)
Calcium: 8.6 mg/dL — ABNORMAL LOW (ref 8.9–10.3)
Chloride: 100 mmol/L (ref 98–111)
Creatinine, Ser: 1.5 mg/dL — ABNORMAL HIGH (ref 0.44–1.00)
GFR calc Af Amer: 41 mL/min — ABNORMAL LOW (ref >60)
GFR calc non Af Amer: 35 mL/min — ABNORMAL LOW (ref >60)
Glucose, Bld: 299 mg/dL — ABNORMAL HIGH (ref 70–99)
Potassium: 4.8 mmol/L (ref 3.5–5.1)
Sodium: 136 mmol/L (ref 135–145)

## 2019-04-27 LAB — GLUCOSE, CAPILLARY: Glucose-Capillary: 290 mg/dL — ABNORMAL HIGH (ref 70–99)

## 2019-04-27 LAB — PREPARE RBC (CROSSMATCH)

## 2019-04-27 LAB — MAGNESIUM: Magnesium: 1.9 mg/dL (ref 1.7–2.4)

## 2019-04-27 MED ORDER — FUROSEMIDE 10 MG/ML IJ SOLN
20.0000 mg | Freq: Once | INTRAMUSCULAR | Status: AC
  Administered 2019-04-27: 20 mg via INTRAVENOUS
  Filled 2019-04-27: qty 2

## 2019-04-27 MED ORDER — SODIUM CHLORIDE 0.9% IV SOLUTION: Freq: Once | INTRAVENOUS | Status: AC

## 2019-04-27 NOTE — Consult Note (Signed)
Consultation Note Date: 04/27/2019   Patient Name: BEAUX VERNE  DOB: 07/21/1950  MRN: [id number]  Age / Sex: 68 y.o., female  PCP: [name], MD Referring Physician: Roxan Hockey, MD  Reason for Consultation: Establishing goals of care  HPI/Patient Profile: 68 y.o. 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 .Admitted to hospital 11/19-11/22 d/t encephalopathy r/t acute kidney injury, polypharmacy and poor glucose control. Readmission 11/29-12/3 for severe peripheral edema, lower leg cellulitis and reporting not being able to care for herself- she was discharged home with home health. She has been receiving assistance at home from [facility].  Admitted now 04/25/2019 with weakness- requiring EMS to come help her to get to bedside toilet- requesting admission to SNF for rehab. Further workup revealed low Hgb requiring transfusion- GI consulted for likely GI bleed- felt to be multifactorial d/t malignancy, chemotherapy, and per patient hemorrhoids- no recommendations for further workup. Additionally she has acute on chronic kidney injury with Cr trending down with IV fluids. Per discussion with Dr. Burr Medico she is not candidate for further chemotherapy in her current state. Palliative medicine consulted for additional assistance with goals of care.   Clinical Assessment and Goals of Care: Met with [place] in her bed. She was awake and alert.  Caryn recounted her past medical history- telling me about her cancer journey.  She spent a great amount of time discussing the  care she has provided in the past for her brother and her husband- until they died.  Her brother lived at [facility] describes the care he received there until his end of life.  Harlee tells me she is feeling like she is receiving conflicting information. She is of the understanding that when she spoke with Dr. Burr Medico she was told that she is not able to receive chemo "for now"- but believes that she will get stronger at rehab and then will be able to receive more chemotherapy. When discussing goals of care- Yanett is DNR- she has strong faith feelings and believes that when it is her time "to go" that she is ready to go and see Jesus and the many family members who have died before her. She also believes that when she is getting close to end of life that God will tell her that it is time. Until then her primary Brinkley is to go to [facility] and receive PT in the hopes of getting strong enough to receive further treatment for her cancer.   Primary Decision Maker PATIENT    SUMMARY OF RECOMMENDATIONS -Per patient's request I called and discussed Amairani's case with Dr. Burr Medico to clarify if patient would be candidate for further chemotherapy treatments -Patient is not at a point  where she is ready for Hospice- she does not feel she is at end of life and has strong preference for going to [facility] to receive physical therapy -She believes her recent decline in function is due to being fluid overloaded during her last admission- she also is confused about what is causing her GI bleeding and if that can be stopped- she wishes to continue to pursue aggressive medical care to treat GI bleeding if possible, decrease fluid overload, and work with PT to increase her functional status and quality of life- as long as she is able -Recommend continue current plan of care- I will touch base again with patient tomorrow and review with her what answers I can find for her questions - I attempted to call her  daughter, received no answer    Code Status/Advance Care Planning:  DNR  Palliative Prophylaxis:   Frequent Pain Assessment  Prognosis:    Unable to determine  Discharge Planning: To Be Determined  Primary Diagnoses: Present on Admission: . Anemia due to GI blood loss   I have reviewed the medical record, interviewed the patient and family, and examined the patient. The following aspects are pertinent.  Past Medical History:  Diagnosis Date  . Anxiety   . CAD (coronary artery disease)    a. s/p DES to LAD and angioplasty to D1 in 05/2017  . Cancer ([place])   . CHF (congestive heart failure) ([facility])    Diastolic  . Chronic back pain   . Chronic pain   . COPD (chronic obstructive pulmonary disease) ([place])   . Degenerative disk disease   . Diabetes mellitus without complication ([place])   . History of medication noncompliance 11/2017   "the patient frequently self-adjusts medications without notifying her physicians"  . Hypertension   . Hypothyroidism   . On home O2    2.5 L N/C prn  . Pedal edema    Social History   Socioeconomic History  . Marital status: Widowed    Spouse name: Not on file  . Number of children: 3  . Years of education: Not on file  . Highest education level: Not on file  Occupational History    Comment: retired from [employer], worked Psychologist, sport and exercise  Tobacco Use  . Smoking status: Former Smoker    Packs/day: 1.50    Years: 45.00    Pack years: 67.50    Types: Cigarettes    Quit date: 11/24/2014    Years since quitting: 4.4  . Smokeless tobacco: Never Used  Substance and Sexual Activity  . Alcohol use: No    Alcohol/week: 0.0 standard drinks  . Drug use: No  . Sexual activity: Not Currently    Birth control/protection: Surgical  Other Topics Concern  . Not on file  Social History Narrative   Has 3 adopted children. Two live in [place].   Social Determinants of Health   Financial Resource Strain:   . Difficulty of Paying Living Expenses: Not  on file  Food Insecurity: Unknown  . Worried About Charity fundraiser in the Last Year: Patient refused  . Ran Out of Food in the Last Year: Patient refused  Transportation Needs: Unknown  . Lack of Transportation (Medical): Patient refused  . Lack of Transportation (Non-Medical): Patient refused  Physical Activity: Unknown  . Days of Exercise per Week: Patient refused  . Minutes of Exercise per Session: Patient refused  Stress: Unknown  . Feeling of Stress : Patient refused  Social Connections: Unknown  .  Frequency of Communication with Friends and Family: Patient refused  . Frequency of Social Gatherings with Friends and Family: Patient refused  . Attends Religious Services: Patient refused  . Active Member of Clubs or Organizations: Patient refused  . Attends Archivist Meetings: Patient refused  . Marital Status: Patient refused   Family History  Problem Relation Age of Onset  . Stroke Mother        deceased 54   . Heart attack Father        deceased 50   . Diabetes Brother   . Cerebral palsy Brother   . Pneumonia Brother   . Diabetes Other   . Heart attack Other   . Colon cancer Neg Hx    Scheduled Meds: . allopurinol  300 mg Oral Daily  . busPIRone  7.5 mg Oral BID  . gabapentin  600 mg Oral TID  . insulin detemir  30 Units Subcutaneous QHS  . levothyroxine  200 mcg Oral Q0600  . pantoprazole  40 mg Oral BID  . polyethylene glycol  17 g Oral BID  . senna  1 tablet Oral Daily  . topiramate  75 mg Oral QHS  . umeclidinium-vilanterol  1 puff Inhalation Daily   Continuous Infusions: . sodium chloride Stopped (04/25/19 1329)   PRN Meds:.albuterol, ALPRAZolam, Gerhardt's butt cream, hydrocortisone, hydrocortisone, ondansetron (ZOFRAN) IV, oxyCODONE-acetaminophen Medications Prior to Admission:  Prior to Admission medications   Medication Sig Start Date End Date Taking? Authorizing Provider  albuterol (PROAIR HFA) 108 (90 Base) MCG/ACT inhaler Inhale 2  puffs into the lungs every 4 (four) hours as needed for wheezing or shortness of breath. For shortness of breath 09/22/18  Yes Eugenie Filler, MD  albuterol (PROVENTIL) (2.5 MG/3ML) 0.083% nebulizer solution Take 2.5 mg by nebulization 2 (two) times daily as needed for wheezing or shortness of breath.    Yes [provider]  allopurinol (ZYLOPRIM) 300 MG tablet Take 300 mg by mouth daily.   Yes [provider]  ALPRAZolam Duanne Moron) 1 MG tablet Take 1 tablet (1 mg total) by mouth 3 (three) times daily as needed for anxiety or sleep. 04/21/19  Yes Truitt Merle, MD  aspirin EC 81 MG tablet Take 81 mg by mouth daily.   Yes [provider]  atorvastatin (LIPITOR) 80 MG tablet Take 1 tablet (80 mg total) by mouth daily at 6 PM. 05/26/17  Yes [name], MD  busPIRone (BUSPAR) 7.5 MG tablet Take 7.5 mg by mouth 2 (two) times daily. 03/10/19  Yes [provider]  cholecalciferol (VITAMIN D3) 25 MCG (1000 UT) tablet Take 5,000 Units by mouth daily.   Yes [provider]  diphenhydrAMINE-zinc acetate (BENADRYL) cream Apply 1 application topically 3 (three) times daily as needed for itching. Patient taking differently: Apply 1 application topically 3 (three) times daily as needed for itching. Patient uses on her head for dry scalp. 03/09/19  Yes Truitt Merle, MD  diphenoxylate-atropine (LOMOTIL) 2.5-0.025 MG tablet Take 1 tablet by mouth 4 (four) times daily as needed for diarrhea or loose stools.  01/03/19  Yes [provider]  furosemide (LASIX) 40 MG tablet Take 60 mg (1 1/2 tabs) in the morning and 40 mg (1 tab) in the afternoon Patient taking differently: Take 40 mg by mouth 2 (two) times daily. 49m each AM and 253meach PM. 10/30/18  Yes Danford, ChSuann LarryMD  gabapentin (NEURONTIN) 600 MG tablet Take 600 mg by mouth 3 (three) times daily.  Yes [provider]  hydrocortisone (ANUSOL-HC) 2.5 % rectal cream Place 1 application rectally 4 (four)  times daily as needed for hemorrhoids.  01/03/19  Yes [provider]  Hydrocortisone (GERHARDT'S BUTT CREAM) CREA Apply 1 application topically 4 (four) times daily as needed for irritation. 12/16/18  Yes Truitt Merle, MD  insulin detemir (LEVEMIR) 100 UNIT/ML injection Inject 0.4 mLs (40 Units total) into the skin at bedtime. 11/26/17  Yes Tat, Shanon Brow, MD  insulin lispro (HUMALOG) 100 UNIT/ML injection Inject 0-20 Units into the skin 3 (three) times daily with meals. Per sliding scale 01/03/19  Yes [provider]  levothyroxine (SYNTHROID, LEVOTHROID) 200 MCG tablet Take 1 tablet by mouth daily before breakfast.  06/24/18  Yes [provider]  nitroGLYCERIN (NITROSTAT) 0.4 MG SL tablet Place 0.4 mg under the tongue every 5 (five) minutes as needed for chest pain.  03/26/18  Yes [provider]  nystatin (MYCOSTATIN/NYSTOP) powder Apply topically 2 (two) times daily. 02/22/19  Yes Alla Feeling, NP  Omega-3 Fatty Acids (FISH OIL PO) Take 1 capsule by mouth daily.    Yes [provider]  ondansetron (ZOFRAN-ODT) 8 MG disintegrating tablet TAKE 1 TABLET BY MOUTH EVERY 8 HOURS AS NEEDED FOR NAUSEA OR VOMITING. 02/23/19  Yes Alla Feeling, NP  oxyCODONE-acetaminophen (PERCOCET) 10-325 MG tablet Take 1 tablet by mouth every 6 (six) hours as needed for pain. 04/21/19  Yes Truitt Merle, MD  OXYGEN Inhale 2 L into the lungs continuous.   Yes [provider]  pantoprazole (PROTONIX) 40 MG tablet Take 1 tablet (40 mg total) by mouth 2 (two) times daily. 10/07/18  Yes Truitt Merle, MD  polyethylene glycol (MIRALAX / GLYCOLAX) 17 g packet Take 17 g by mouth 2 (two) times daily. 04/14/19  Yes Adhikari, Tamsen Meek, MD  potassium chloride SA (KLOR-CON) 20 MEQ tablet Take 1 tablet (20 mEq total) by mouth 2 (two) times daily. 04/15/19  Yes Shelly Coss, MD  promethazine (PHENERGAN) 12.5 MG tablet Take 1 tablet (12.5 mg total) by mouth every 6 (six) hours as needed for nausea or  vomiting. 02/22/19  Yes Alla Feeling, NP  senna (SENOKOT) 8.6 MG TABS tablet Take 1 tablet (8.6 mg total) by mouth daily. Patient taking differently: Take 2 tablets by mouth 2 (two) times daily. 2 tabs each AM, 2 tabs HS 04/15/19  Yes Adhikari, Tamsen Meek, MD  silver sulfADIAZINE (SILVADENE) 1 % cream Apply 1 application topically 2 (two) times daily.  01/03/19  Yes [provider]  sucralfate (CARAFATE) 1 g tablet Take 1 tablet (1 g total) by mouth 4 (four) times daily -  with meals and at bedtime. 5 min before meals for radiation induced esophagitis 12/03/18  Yes Tyler Pita, MD  SURE COMFORT INS SYR 1CC/28G 28G X 1/2" 1 ML MISC USE TO INJECT INSULIN UP TO 4 TIMES DAILY. 03/06/17  Yes Cassandria Anger, MD  topiramate (TOPAMAX) 25 MG tablet Take 75 mg by mouth at bedtime.  03/18/18  Yes [provider]  umeclidinium-vilanterol (ANORO ELLIPTA) 62.5-25 MCG/INH AEPB Inhale 1 puff into the lungs daily. 05/26/17  Yes [name], MD  insulin aspart (NOVOLOG) 100 UNIT/ML injection Inject 1-18 Units into the skin 3 (three) times daily with meals. Patient taking differently: Inject 1-18 Units into the skin 3 (three) times daily with meals. Per sliding scale 04/09/18   Johnson, Clanford L, MD  ondansetron (ZOFRAN) 8 MG tablet Take 1 tablet (8 mg total) by mouth every 8 (eight)  hours as needed for nausea or vomiting. Patient not taking: Reported on 04/25/2019 12/15/18   Truitt Merle, MD  oxyCODONE (OXY IR/ROXICODONE) 5 MG immediate release tablet Take 1 tablet (5 mg total) by mouth every 12 (twelve) hours as needed for severe pain. Patient not taking: Reported on 04/25/2019 03/18/19   Kyung Rudd, MD   Allergies  Allergen Reactions  . Dilaudid [Hydromorphone Hcl] Itching  . Midodrine Hcl Swelling    After one dose had anaphylactic reaction, had to call EMS.  Marland Kitchen Actifed Cold-Allergy [Chlorpheniramine-Phenylephrine]     "I was sick and red and it didn't agree with me at all"  . Doxycycline  Nausea And Vomiting  . Other Cough    Pt states she is allergic to ragweed and that she starts coughing and sneezing like crazy  . Penicillins Hives and Itching    Tolerates Rocephin Has patient had a PCN reaction causing immediate rash, facial/tongue/throat swelling, SOB or lightheadedness with hypotension: Yes Has patient had a PCN reaction causing severe rash involving mucus membranes or skin necrosis: No Has patient had a PCN reaction that required hospitalization Yes Has patient had a PCN reaction occurring within the last 10 years: No If all of the above answers are "NO", then may proceed with Cephalosporin use.   . Reglan [Metoclopramide] Itching  . Valium [Diazepam] Itching  . Vistaril [Hydroxyzine Hcl] Itching   Review of Systems  Constitutional: Positive for activity change and fatigue.  Gastrointestinal: Positive for abdominal distention and anal bleeding.    Physical Exam Vitals and nursing note reviewed.  Cardiovascular:     Pulses: Normal pulses.  Pulmonary:     Effort: Pulmonary effort is normal.  Skin:    General: Skin is warm and dry.  Neurological:     Mental Status: She is alert.     Comments: Poor sight  Psychiatric:        Mood and Affect: Mood normal.        Behavior: Behavior normal.     Vital Signs: BP (!) 106/46 (BP Location: Left Arm)   Pulse 76   Temp 98.7 F (37.1 C) (Oral)   Resp 20   Ht 5' (1.524 m)   Wt 132.6 kg   SpO2 100%   BMI 57.09 kg/m  Pain Scale: 0-10   Pain Score: 10-Worst pain ever   SpO2: SpO2: 100 % O2 Device:SpO2: 100 % O2 Flow Rate: .O2 Flow Rate (L/min): 2 L/min  IO: Intake/output summary:   Intake/Output Summary (Last 24 hours) at 04/27/2019 1347 Last data filed at 04/27/2019 0900 Gross per 24 hour  Intake 890 ml  Output 800 ml  Net 90 ml    LBM: Last BM Date: 04/27/19 Baseline Weight: Weight: 129 kg Most recent weight: Weight: 132.6 kg     Palliative Assessment/Data: PPS: 30%     Thank you for  this consult. Palliative medicine will continue to follow and assist as needed.   Time In: 1250 Time Out: 1450 Time Total: 120 minutes Greater than 50%  of this time was spent counseling and coordinating care related to the above assessment and plan.  Signed by: Mariana Kaufman, AGNP-C Palliative Medicine    Please contact Palliative Medicine Team phone at [phone] for questions and concerns.  For individual provider: See Shea Evans

## 2019-04-27 NOTE — Progress Notes (Signed)
Physical Therapy Treatment Patient Details Name: Stephanie Combs MRN: [id number] DOB: Apr 10, 1951 Today's Date: 04/27/2019    History of Present Illness To the emergency room for evaluation of nursing home placement.  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  The patient has been getting progressively weaker.  Patient is currently not undergoing any chemotherapy.  Patient has been followed by palliative care, home health and they have been trying to get her placed in an inpatient rehab facility.  Patient had an office visit with her oncologist on December 10.  The patient and her current status is not a candidate for more chemotherapy.  Notes indicate that the patient requires a PT consult in order to qualify for skilled nursing facility placement.   Patient states she has been getting weaker over the last week.  She has had to call EMS several times to assist her with going to the commode and transferring around her house.  Patient has had significant lower extremity edema and is making it more difficult for her to walk.  She is becoming more forgetful.  She feels like she is getting more confused.    PT Comments    Pt sitting in chair, friendly and willing to participate with therapy.  Pt with need for restroom, ambulated with RW min A for bowel movement that did have some blood- RN aware.  Pt presents with increased endurance this session with ability to complete multiple sets of gait training from chair to door and back.  Does fatigue quickly required seated rest breaks.  Pt limited by pain in buttock, RN aware.  EOS pt requested to return to bed for pressure/pain relief.  Pt left in bed with call bell within reach and RN in room.  No reports of increased pain through session.   Follow Up Recommendations  SNF     Equipment Recommendations  None recommended by PT     Recommendations for Other Services       Precautions / Restrictions Precautions Precautions: Fall Restrictions Weight Bearing Restrictions: No    Mobility  Bed Mobility Overal bed mobility: Needs Assistance Bed Mobility: Sit to Supine       Sit to supine: Mod assist   General bed mobility comments: slow labored movement, needs assistance with LE into bed  Transfers Overall transfer level: Needs assistance Equipment used: Rolling walker (2 wheeled) Transfers: Sit to/from Stand Sit to Stand: Min assist         General transfer comment: Min assistance with cueing for handplacement with sit to stand.  Stable balance with standing use of RW.  Sit to stand x 4 through session.  Ambulation/Gait Ambulation/Gait assistance: Min assist Gait Distance (Feet): 12 Feet(4 sets to restroom then chair to door with rest breaks between) Assistive device: Rolling walker (2 wheeled)   Gait velocity: decreased   General Gait Details: slow labored cadence without loss of balance, limited secondary to fatigue   Stairs             Wheelchair Mobility    Modified Rankin (Stroke Patients Only)       Balance                                            Cognition Arousal/Alertness: Awake/alert Behavior During Therapy: [facility]  for tasks assessed/performed Overall Cognitive Status: Within Functional Limits for tasks assessed                                        Exercises General Exercises - Lower Extremity Long Arc Quad: AROM;Both;15 reps;Seated Toe Raises: Both;10 reps;Seated Heel Raises: Both;10 reps;Seated    General Comments        Pertinent Vitals/Pain Faces Pain Scale: Hurts worst Pain Location: left hip/buttocks; reports stiffness neck and back Pain Descriptors / Indicators: Burning;Sore Pain Intervention(s): Monitored during session;Limited activity within patient's tolerance;Repositioned;RN gave pain meds during session     Home Living                      Prior Function            PT Goals (current goals can now be found in the care plan section)      Frequency    Min 2X/week      PT Plan Current plan remains appropriate    Co-evaluation              AM-PAC PT "6 Clicks" Mobility   Outcome Measure  Help needed turning from your back to your side while in a flat bed without using bedrails?: A Lot Help needed moving from lying on your back to sitting on the side of a flat bed without using bedrails?: A Lot Help needed moving to and from a bed to a chair (including a wheelchair)?: A Little Help needed standing up from a chair using your arms (e.g., wheelchair or bedside chair)?: A Little Help needed to walk in hospital room?: A Lot Help needed climbing 3-5 steps with a railing? : A Lot 6 Click Score: 14    End of Session Equipment Utilized During Treatment: Gait belt Activity Tolerance: Patient tolerated treatment well;Patient limited by fatigue Patient left: in bed;with call bell/phone within reach Nurse Communication: Mobility status PT Visit Diagnosis: Unsteadiness on feet (R26.81);Other abnormalities of gait and mobility (R26.89);Muscle weakness (generalized) (M62.81)     Time: 7517-0017 PT Time Calculation (min) (ACUTE ONLY): 55 min  Charges:  $Therapeutic Exercise: 8-22 mins $Therapeutic Activity: 38-52 mins                    [address] LPTA; CBIS [phone]  Aldona Lento 04/27/2019, 12:49 PM

## 2019-04-27 NOTE — Progress Notes (Signed)
Critical lab called in. Hgb: 6.7 MD notified via Amion.

## 2019-04-27 NOTE — NC FL2 (Signed)
[place] LEVEL OF CARE SCREENING TOOL     IDENTIFICATION  Patient Name: Stephanie Combs Birthdate: 1950/11/15 Sex: female Admission Date (Current Location): 04/25/2019  [place] Number:  [place] [facility] [id number] [place] and Address:  [facility] [address], [place]      Provider Number: [id number]  Attending Physician Name and Address:  Roxan Hockey, MD  Relative Name and Phone Number:  Silvano Rusk PH:[phone]    Current Level of Care: Hospital Recommended Level of Care: [place] Prior Approval Number:    Date Approved/Denied:   PASRR Number:    Discharge Plan: SNF    Current Diagnoses: Patient Active Problem List   Diagnosis Date Noted  . Anemia due to GI blood loss 04/25/2019  . Pressure injury of skin 04/12/2019  . Fluid overload 04/10/2019  . Lung cancer ([place]) 04/10/2019  . Acute encephalopathy 04/02/2019  . Brain metastasis ([place]) 03/09/2019  . Acute on chronic diastolic heart failure ([place]) 10/11/2018  . Assistance needed with transportation 09/22/2018  . Primary cancer of hepatic flexure of colon ([place])   . Small cell lung cancer, left upper lobe ([place])   . Colonic mass 09/10/2018  . Colon polyps 09/10/2018  . Cellulitis of leg, left 09/10/2018  . Pain of lower extremity   . Non-intractable vomiting   . Abdominal pain, epigastric   . Heme positive stool   . Acute on chronic diastolic CHF (congestive heart failure) ([place]) 09/06/2018  . Symptomatic anemia 08/31/2018  . Anemia 08/30/2018  . Chronic pain 07/05/2018  . On home O2 07/05/2018  . Opioid dependence ([place]) 07/05/2018  . CAD (coronary artery disease) 05/14/2018  . AKI (acute kidney injury) ([place]) 04/05/2018  . Cellulitis 01/10/2018  . Cellulitis of right lower extremity 01/07/2018  . Altered mental status 01/06/2018  . Type 2 diabetes mellitus with hypoglycemia without coma ([place]) 11/24/2017  . Anxiety 11/24/2017  .  Chronic respiratory failure with hypoxia ([place]) 11/24/2017  . Syncope 10/24/2017  . Bradycardia   . Syncope due to orthostatic hypotension 09/04/2017  . Obesity, Class III, BMI 40-49.9 (morbid obesity) ([place]) 09/04/2017  . Abnormal nuclear stress test 05/19/2017  . Atypical angina ([place]) 05/19/2017  . Edema 05/15/2017  . Skin ulcer ([place]) 01/30/2017  . Tinea cruris 10/10/2016  . Vitamin D deficiency 08/19/2016  . Personal history of noncompliance with medical treatment, presenting hazards to health 06/17/2016  . CAP (community acquired pneumonia) 07/25/2015  . Pressure ulcer 05/02/2015  . Hyponatremia 05/01/2015  . Acute-on-chronic kidney injury ([place]) 05/01/2015  . Uncontrolled type 2 diabetes mellitus with stage 3 chronic kidney disease ([place]) 05/01/2015  . Cellulitis of foot, right 03/24/2015  . Peripheral edema 12/04/2014  . Acute renal failure superimposed on stage 3 chronic kidney disease ([place]) 11/24/2014  . Bilateral lower extremity edema   . Diabetic ulcer of right great toe ([place])   . Foot ulcer due to secondary DM ([place]) 11/07/2014  . Diabetic ulcer of right foot ([place]) 11/07/2014  . Edema of lower extremity 05/27/2013  . CKD (chronic kidney disease), stage III ([place]) 04/14/2013  . Anemia in CKD (chronic kidney disease) 04/14/2013  . Chest pain 04/14/2013  . Dyspnea 04/14/2013  . Chronic diastolic CHF (congestive heart failure) ([place])   . Hypothyroidism   . COPD (chronic obstructive pulmonary disease) ([place]) 08/07/2012  . Morbid obesity ([place]) 08/07/2012  . DM type 2 (diabetes mellitus, type 2) ([place]) 08/07/2012  . HTN (hypertension), benign 08/07/2012  . ARF (  acute renal failure) ([place]) 08/07/2012    Orientation RESPIRATION BLADDER Height & Weight     Self, Time, Situation, Place  O2(2L) Continent Weight: 132.6 kg Height:  5' (152.4 cm)  BEHAVIORAL SYMPTOMS/MOOD NEUROLOGICAL BOWEL NUTRITION STATUS      Continent    AMBULATORY STATUS COMMUNICATION OF NEEDS Skin   Extensive  Assist Verbally Normal                       Personal Care Assistance Level of Assistance  Bathing, Feeding, Dressing Bathing Assistance: Limited assistance Feeding assistance: Independent Dressing Assistance: Limited assistance     Functional Limitations Info  Sight, Hearing, Speech Sight Info: Adequate Hearing Info: Adequate Speech Info: Adequate    SPECIAL CARE FACTORS FREQUENCY  PT (By licensed PT), OT (By licensed OT)     PT Frequency: 5x/week OT Frequency: 3x/week            Contractures Contractures Info: Not present    Additional Factors Info  Code Status, Allergies, Psychotropic Code Status Info: DNR Allergies Info: Dilaudid (Hydromorphone Hcl) Midodrine Hcl Actifed Cold-allergy (Chlorpheniramine-phenylephrine) Doxycycline Other Penicillins Reglan (Metoclopramide) Valium (Diazepam)  Vistaril (Hydroxyzine Hcl) Psychotropic Info: busPIRone (BUSPAR) tablet 7.5 mg, ALPRAZolam (XANAX) tablet 1 mg  PRN         Current Medications (04/27/2019):  This is the current hospital active medication list Current Facility-Administered Medications  Medication Dose Route Frequency Provider Last Rate Last Admin  . 0.9 %  sodium chloride infusion  10 mL/hr Intravenous Once Enzo Bi, MD   Stopped at 04/25/19 1329  . albuterol (PROVENTIL) (2.5 MG/3ML) 0.083% nebulizer solution 3 mL  3 mL Inhalation Q4H PRN Enzo Bi, MD      . allopurinol (ZYLOPRIM) tablet 300 mg  300 mg Oral Daily Enzo Bi, MD   300 mg at 04/27/19 1100  . ALPRAZolam Duanne Moron) tablet 1 mg  1 mg Oral TID PRN Enzo Bi, MD   1 mg at 04/27/19 1100  . busPIRone (BUSPAR) tablet 7.5 mg  7.5 mg Oral BID Enzo Bi, MD   7.5 mg at 04/27/19 1100  . gabapentin (NEURONTIN) capsule 600 mg  600 mg Oral TID Enzo Bi, MD   600 mg at 04/27/19 1100  . Gerhardt's butt cream 1 application  1 application Topical QID PRN Enzo Bi, MD   1 application at 44/31/54 1204  . hydrocortisone (ANUSOL-HC) 2.5 % rectal cream 1 application   1 application Rectal QID PRN Enzo Bi, MD   1 application at 00/86/76 1204  . hydrocortisone (ANUSOL-HC) suppository 25 mg  25 mg Rectal BID PRN Gardiner Barefoot, NP   25 mg at 04/25/19 2349  . insulin detemir (LEVEMIR) injection 30 Units  30 Units Subcutaneous QHS Enzo Bi, MD   30 Units at 04/26/19 2229  . levothyroxine (SYNTHROID) tablet 200 mcg  200 mcg Oral Q0600 Enzo Bi, MD   200 mcg at 04/27/19 0533  . ondansetron (ZOFRAN) injection 4 mg  4 mg Intravenous Q6H PRN Enzo Bi, MD      . oxyCODONE-acetaminophen (PERCOCET/ROXICET) 5-325 MG per tablet 2 tablet  2 tablet Oral Q6H PRN Enzo Bi, MD   2 tablet at 04/27/19 1100  . pantoprazole (PROTONIX) EC tablet 40 mg  40 mg Oral BID Enzo Bi, MD   40 mg at 04/27/19 1100  . polyethylene glycol (MIRALAX / GLYCOLAX) packet 17 g  17 g Oral BID Enzo Bi, MD   17 g at 04/27/19 1100  .  senna (SENOKOT) tablet 8.6 mg  1 tablet Oral Daily Enzo Bi, MD   8.6 mg at 04/27/19 1100  . topiramate (TOPAMAX) tablet 75 mg  75 mg Oral QHS Enzo Bi, MD   75 mg at 04/26/19 2224  . umeclidinium-vilanterol (ANORO ELLIPTA) 62.5-25 MCG/INH 1 puff  1 puff Inhalation Daily Enzo Bi, MD   1 puff at 04/27/19 8299     Discharge Medications: Please see discharge summary for a list of discharge medications.  Relevant Imaging Results:  Relevant Lab Results:   Additional Information SS# [id number]  Alessandra Sawdey, Chauncey Reading, RN

## 2019-04-27 NOTE — Progress Notes (Signed)
Inpatient Diabetes Program Recommendations  AACE/ADA: New Consensus Statement on Inpatient Glycemic Control (2015)  Target Ranges:  Prepandial:   less than 140 mg/dL      Peak postprandial:   less than 180 mg/dL (1-2 hours)      Critically ill patients:  140 - 180 mg/dL   Lab Results  Component Value Date   GLUCAP 290 (H) 04/27/2019   HGBA1C 8.4 (H) 04/11/2019    Review of Glycemic Control Results for Stephanie Combs, Stephanie Combs (MRN [id number]) as of 04/27/2019 08:15  Ref. Range 04/25/2019 22:24 04/26/2019 07:49 04/26/2019 11:16 04/26/2019 22:33 04/27/2019 02:52  Glucose-Capillary Latest Ref Range: 70 - 99 mg/dL 383 (H) 283 (H) 335 (H) 368 (H) 290 (H)   Inpatient Diabetes Program Recommendations:   -Consider increase in Levemir to 20 units bid  Thank you, Nani Gasser. Oree Mirelez, RN, MSN, CDE  Diabetes Coordinator Inpatient Glycemic Control Team Team Pager [phone] (8am-5pm) 04/27/2019 8:19 AM

## 2019-04-27 NOTE — Progress Notes (Signed)
Oncology brief note   Patient is well-known to me, under my care for her metastatic lung cancer and colon cancer.  I am aware of her recent admission to [facility] for GI bleeding.  I have reviewed her chart. I called her and left a VM for her.   I recommend blood transfusion to keep hemoglobin above 8.0.  I recently had a virtual visit with Bonnita Nasuti, and I discussed about hospice.  She is not ready.  She would like to go to rehab. She has limited support at home, and it's getting more difficult for her to live alone.   I spoke with palliative care NP today.  I will be available for any questions, please call my cell [phone].  Truitt Merle MD 04/27/2019

## 2019-04-27 NOTE — Plan of Care (Signed)

## 2019-04-27 NOTE — Care Management Important Message (Signed)
Important Message  Patient Details  Name: Stephanie Combs MRN: [id number] Date of Birth: 07-03-1950   Medicare Important Message Given:  Yes(letter placed on chart, Raquel Sarna, RN will deliver to patient)     Tommy Medal 04/27/2019, 3:28 PM

## 2019-04-28 ENCOUNTER — Telehealth: Payer: Self-pay | Admitting: Radiation Oncology

## 2019-04-28 DIAGNOSIS — I5030 Unspecified diastolic (congestive) heart failure: Secondary | ICD-10-CM | POA: Diagnosis not present

## 2019-04-28 DIAGNOSIS — L89320 Pressure ulcer of left buttock, unstageable: Secondary | ICD-10-CM | POA: Diagnosis not present

## 2019-04-28 DIAGNOSIS — C189 Malignant neoplasm of colon, unspecified: Secondary | ICD-10-CM | POA: Diagnosis not present

## 2019-04-28 DIAGNOSIS — E119 Type 2 diabetes mellitus without complications: Secondary | ICD-10-CM | POA: Diagnosis not present

## 2019-04-28 DIAGNOSIS — D500 Iron deficiency anemia secondary to blood loss (chronic): Secondary | ICD-10-CM | POA: Diagnosis not present

## 2019-04-28 DIAGNOSIS — L89159 Pressure ulcer of sacral region, unspecified stage: Secondary | ICD-10-CM | POA: Diagnosis not present

## 2019-04-28 DIAGNOSIS — E785 Hyperlipidemia, unspecified: Secondary | ICD-10-CM | POA: Diagnosis not present

## 2019-04-28 DIAGNOSIS — J9610 Chronic respiratory failure, unspecified whether with hypoxia or hypercapnia: Secondary | ICD-10-CM | POA: Diagnosis not present

## 2019-04-28 DIAGNOSIS — R600 Localized edema: Secondary | ICD-10-CM | POA: Diagnosis not present

## 2019-04-28 DIAGNOSIS — E1140 Type 2 diabetes mellitus with diabetic neuropathy, unspecified: Secondary | ICD-10-CM | POA: Diagnosis not present

## 2019-04-28 DIAGNOSIS — D649 Anemia, unspecified: Secondary | ICD-10-CM | POA: Diagnosis not present

## 2019-04-28 DIAGNOSIS — J449 Chronic obstructive pulmonary disease, unspecified: Secondary | ICD-10-CM | POA: Diagnosis not present

## 2019-04-28 DIAGNOSIS — E039 Hypothyroidism, unspecified: Secondary | ICD-10-CM | POA: Diagnosis not present

## 2019-04-28 DIAGNOSIS — I130 Hypertensive heart and chronic kidney disease with heart failure and stage 1 through stage 4 chronic kidney disease, or unspecified chronic kidney disease: Secondary | ICD-10-CM | POA: Diagnosis not present

## 2019-04-28 DIAGNOSIS — R601 Generalized edema: Secondary | ICD-10-CM | POA: Diagnosis not present

## 2019-04-28 DIAGNOSIS — I2510 Atherosclerotic heart disease of native coronary artery without angina pectoris: Secondary | ICD-10-CM | POA: Diagnosis not present

## 2019-04-28 DIAGNOSIS — Z515 Encounter for palliative care: Secondary | ICD-10-CM | POA: Diagnosis not present

## 2019-04-28 DIAGNOSIS — C3412 Malignant neoplasm of upper lobe, left bronchus or lung: Secondary | ICD-10-CM | POA: Diagnosis not present

## 2019-04-28 DIAGNOSIS — N1832 Chronic kidney disease, stage 3b: Secondary | ICD-10-CM | POA: Diagnosis not present

## 2019-04-28 DIAGNOSIS — Z66 Do not resuscitate: Secondary | ICD-10-CM | POA: Diagnosis not present

## 2019-04-28 DIAGNOSIS — C7931 Secondary malignant neoplasm of brain: Secondary | ICD-10-CM | POA: Diagnosis not present

## 2019-04-28 DIAGNOSIS — G629 Polyneuropathy, unspecified: Secondary | ICD-10-CM | POA: Diagnosis not present

## 2019-04-28 DIAGNOSIS — C3490 Malignant neoplasm of unspecified part of unspecified bronchus or lung: Secondary | ICD-10-CM | POA: Diagnosis not present

## 2019-04-28 DIAGNOSIS — Z79899 Other long term (current) drug therapy: Secondary | ICD-10-CM | POA: Diagnosis not present

## 2019-04-28 DIAGNOSIS — Z7189 Other specified counseling: Secondary | ICD-10-CM | POA: Diagnosis not present

## 2019-04-28 DIAGNOSIS — G8929 Other chronic pain: Secondary | ICD-10-CM | POA: Diagnosis not present

## 2019-04-28 LAB — BASIC METABOLIC PANEL
Anion gap: 11 (ref 5–15)
BUN: 37 mg/dL — ABNORMAL HIGH (ref 8–23)
CO2: 27 mmol/L (ref 22–32)
Calcium: 8.8 mg/dL — ABNORMAL LOW (ref 8.9–10.3)
Chloride: 99 mmol/L (ref 98–111)
Creatinine, Ser: 1.4 mg/dL — ABNORMAL HIGH (ref 0.44–1.00)
GFR calc Af Amer: 44 mL/min — ABNORMAL LOW (ref >60)
GFR calc non Af Amer: 38 mL/min — ABNORMAL LOW (ref >60)
Glucose, Bld: 295 mg/dL — ABNORMAL HIGH (ref 70–99)
Potassium: 4.7 mmol/L (ref 3.5–5.1)
Sodium: 137 mmol/L (ref 135–145)

## 2019-04-28 LAB — CBC
HCT: 25.2 % — ABNORMAL LOW (ref 36.0–46.0)
HCT: 26.7 % — ABNORMAL LOW (ref 36.0–46.0)
Hemoglobin: 7.7 g/dL — ABNORMAL LOW (ref 12.0–15.0)
Hemoglobin: 8 g/dL — ABNORMAL LOW (ref 12.0–15.0)
MCH: 30.3 pg (ref 26.0–34.0)
MCH: 29.6 pg (ref 26.0–34.0)
MCHC: 30.6 g/dL (ref 30.0–36.0)
MCHC: 30 g/dL (ref 30.0–36.0)
MCV: 99.2 fL (ref 80.0–100.0)
MCV: 98.9 fL (ref 80.0–100.0)
Platelets: 218 10*3/uL (ref 150–400)
Platelets: 251 10*3/uL (ref 150–400)
RBC: 2.54 MIL/uL — ABNORMAL LOW (ref 3.87–5.11)
RBC: 2.7 MIL/uL — ABNORMAL LOW (ref 3.87–5.11)
RDW: 17.9 % — ABNORMAL HIGH (ref 11.5–15.5)
RDW: 17.7 % — ABNORMAL HIGH (ref 11.5–15.5)
WBC: 5.2 10*3/uL (ref 4.0–10.5)
WBC: 5.5 10*3/uL (ref 4.0–10.5)
nRBC: 0 % (ref 0.0–0.2)
nRBC: 0 % (ref 0.0–0.2)

## 2019-04-28 MED ORDER — FUROSEMIDE 40 MG PO TABS: 40.0000 mg | ORAL_TABLET | Freq: Every day | ORAL | 1 refills | Status: DC

## 2019-04-28 MED ORDER — ALPRAZOLAM 1 MG PO TABS: 1.0000 mg | ORAL_TABLET | Freq: Three times a day (TID) | ORAL | 0 refills | Status: DC | PRN

## 2019-04-28 MED ORDER — OXYCODONE-ACETAMINOPHEN 5-325 MG PO TABS: 1.0000 | ORAL_TABLET | Freq: Four times a day (QID) | ORAL | 0 refills | Status: DC | PRN

## 2019-04-28 MED ORDER — INSULIN DETEMIR 100 UNIT/ML ~~LOC~~ SOLN: 34.0000 [IU] | Freq: Every day | SUBCUTANEOUS | 0 refills | Status: DC

## 2019-04-28 MED ORDER — ONDANSETRON HCL 8 MG PO TABS: 8.0000 mg | ORAL_TABLET | Freq: Three times a day (TID) | ORAL | 2 refills | Status: AC | PRN

## 2019-04-28 MED ORDER — INSULIN ASPART 100 UNIT/ML ~~LOC~~ SOLN: 0.0000 [IU] | SUBCUTANEOUS | 11 refills | Status: DC

## 2019-04-28 MED ORDER — POTASSIUM CHLORIDE CRYS ER 20 MEQ PO TBCR: 20.0000 meq | EXTENDED_RELEASE_TABLET | Freq: Every day | ORAL | 1 refills | Status: DC

## 2019-04-28 MED ORDER — SENNA 8.6 MG PO TABS: 2.0000 | ORAL_TABLET | Freq: Two times a day (BID) | ORAL | 0 refills | Status: AC

## 2019-04-28 NOTE — TOC Transition Note (Signed)
Transition of Care [facility]) - CM/SW Discharge Note   Patient Details  Name: Stephanie Combs MRN: [id number] Date of Birth: 02-01-1951  [facility] CM/SW Contact:  Kestrel Mis, Chauncey Reading, RN Phone Number: 04/28/2019, 12:18 PM   Clinical Narrative:  Patient discharging today to [facility] today. EMS arranged for 2 pm, going to room 607B.   DC clinicals sent.  Marcene Brawn of [facility] notified of DC, will provide palliative service at [facility].     Final next level of care: Skilled Nursing Facility Barriers to Discharge: Barriers Resolved   Patient Goals and CMS Choice Patient states their goals for this hospitalization and ongoing recovery are:: go to SNF and return home CMS Medicare.gov Compare Post Acute Care list provided to:: Patient Choice offered to / list presented to : Patient  Discharge Placement              Patient chooses bed at: [facility] Patient to be transferred to facility by: EMS   Patient and family notified of of transfer: 04/28/19         Social Determinants of Health ([place]) Interventions     Readmission Risk Interventions Readmission Risk Prevention Plan 04/28/2019 04/11/2019 10/13/2018  Transportation Screening Complete Complete Complete  Medication Review Press photographer) Complete Complete Complete  PCP or Specialist appointment within 3-5 days of discharge Complete Complete (No Data)  PCP/Specialist Appt Not Complete comments - - Recent admit; remains acutely ill  HRI or Home Care Consult Complete Complete Complete  SW Recovery Care/Counseling Consult Complete Not Complete Complete  SW Consult Not Complete Comments - not appropriate -  Palliative Care Screening Complete Not Applicable Not Applicable  Skilled Nursing Facility Complete Not Applicable Not Applicable  Some recent data might be hidden

## 2019-04-28 NOTE — Telephone Encounter (Signed)
  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 Her treatment was delayed due to testing positive for coronavirus.  Narrative:  The patient was contacted today for routine follow-up. During treatment she did very well with radiotherapy and did not have significant desquamation. She has yet to proceed with treatment of her colon cancer. She was just discharged from the hospital today due to failure to thrive.   Impression/Plan: 1.  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 The patient tolerated radiotherapy to the brain. She will continue to be followed by Dr. Burr Medico, but it appears from her hospital notes she is not a candidate for further  therapy and she declined hospice care at this time but is discharging to a SNF. We will follow along with Dr. Ernestina Penna recommendations. If she improved significantly she would be followed in our brain oncology program, but we will see what she decides for her care.  3. 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 She is going to meet with Dr. Burr Medico on Thursday to determine next steps.     Carola Rhine, PAC

## 2019-04-28 NOTE — Discharge Instructions (Signed)
1) repeat CBC blood test on Monday, 05/02/2019 2) palliative care and hospice consult at [facility] rehab facility advised 3) follow-up with oncologist Dr. Burr Medico post discharge from rehab facility 4)Avoid ibuprofen/Advil/Aleve/Motrin/Goody Powders/Naproxen/BC powders/Meloxicam/Diclofenac/Indomethacin and other Nonsteroidal anti-inflammatory medications as these will make you more likely to bleed and can cause stomach ulcers, can also cause Kidney problems.

## 2019-04-28 NOTE — Discharge Summary (Signed)
Stephanie Combs, is a 68 y.o. female  DOB [birth year]  MRN [id number].  Admission date:  04/25/2019  Admitting Physician  Enzo Bi, MD  Discharge Date:  04/28/2019   Primary MD  [name], MD  Recommendations for primary care physician for things to follow:   1) repeat CBC blood test on Monday, 05/02/2019 2) palliative care and hospice consult at [facility] rehab facility advised 3) follow-up with oncologist Dr. Burr Medico post discharge from rehab facility 4)Avoid ibuprofen/Advil/Aleve/Motrin/Goody Powders/Naproxen/BC powders/Meloxicam/Diclofenac/Indomethacin and other Nonsteroidal anti-inflammatory medications as these will make you more likely to bleed and can cause stomach ulcers, can also cause Kidney problems.   Admission Diagnosis  Anemia due to GI blood loss [D50.0] Gastrointestinal hemorrhage, unspecified gastrointestinal hemorrhage type [K92.2]  Discharge Diagnosis  Anemia due to GI blood loss [D50.0] Gastrointestinal hemorrhage, unspecified gastrointestinal hemorrhage type [K92.2]    Active Problems:   Anemia due to GI blood loss   Goals of care, counseling/discussion   Advanced care planning/counseling discussion   Palliative care by specialist   Do not resuscitate      Past Medical History:  Diagnosis Date   Anxiety    CAD (coronary artery disease)    a. s/p DES to LAD and angioplasty to D1 in 05/2017   Cancer [facility])    CHF (congestive heart failure) (HCC)    Diastolic   Chronic back pain    Chronic pain    COPD (chronic obstructive pulmonary disease) (HCC)    Degenerative disk disease    Diabetes mellitus without complication ([place])    History of medication noncompliance 11/2017   "the patient frequently self-adjusts medications without notifying her physicians"   Hypertension    Hypothyroidism    On home O2    2.5 L N/C prn   Pedal edema     Past Surgical  History:  Procedure Laterality Date   BIOPSY  09/09/2018   Procedure: BIOPSY;  Surgeon: Daneil Dolin, MD;  [facility];  Service: Endoscopy;;  gastric esophageal hepatic flexure colon   COLONOSCOPY WITH PROPOFOL N/A 09/09/2018   hepatic flexure colon mass s/p biopsy, with polyps associated with tumor scattered all the way to the cecum not removed, multiple splenic flexure and rectal polyps removed, left-sided diverticulosis, ?right hemicolectomy. Hepatic flexure adenocarcinoma and additional adenocarcinoma in small splenic flexure polyp. Concern for synchronous colon cancer. High risk for surgical resection.    CORONARY STENT INTERVENTION N/A 05/22/2017   Procedure: CORONARY STENT INTERVENTION;  Surgeon: Martinique, Peter M, MD;  Location: [facility];  Service: Cardiovascular;  Laterality: N/A;   ESOPHAGOGASTRODUODENOSCOPY (EGD) WITH PROPOFOL N/A 09/09/2018   abnormal distal esophagus suspicious for Barrett's s/p biopsy, erosive gastropathy, duodenal bulbar erosions. +Barrett's   HERNIA REPAIR     POLYPECTOMY  09/09/2018   Procedure: POLYPECTOMY;  Surgeon: Daneil Dolin, MD;  [facility];  Service: Endoscopy;;  colon   RIGHT/LEFT HEART CATH AND CORONARY ANGIOGRAPHY N/A 05/19/2017   Procedure: RIGHT/LEFT HEART CATH AND CORONARY ANGIOGRAPHY;  Surgeon: Leonie Man, MD;  Location: [facility];  Service: Cardiovascular;  Laterality: N/A;   TUBAL LIGATION     VIDEO BRONCHOSCOPY WITH ENDOBRONCHIAL ULTRASOUND Left 09/14/2018   Procedure: VIDEO BRONCHOSCOPY WITH ENDOBRONCHIAL ULTRASOUND with biopsies;  Surgeon: Margaretha Seeds, MD;  Location: [facility];  Service: Thoracic;  Laterality: Left;       HPI  from the history and physical done on the day of admission:    - I have personally briefly reviewed patient's old medical records in [place]  Chief Complaint: can't walk  HPI: [name] is a 68 y.o. 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  Pt reported that she was able to walk until she got a big bag of fluid right before her last discharge from the hospital, which she said caused her to swell up and then she couldn't walk.  Swelling is present around her abdomen and both legs.  Pt reported being compliant with her diuretic at home.  Pt wanted to go to [facility] to get rehab.  Per ED notes, pt had been calling EMS multiple times per day to help move her and with her tolieting.   No fever, dyspnea, chest pain, abdominal pain, N/V/D.   ED Course: initial vitals: afebrile, HR 85, BP 122/51, sating 99% on room air.  Labs notable for normal WBC, Hgb 6.6, MCV 104.2, K+ 5.9, Cr 2.39 (up from 1.42 about a week ago), BUN 69, fecal occult pos, COVID neg.  Pt received 1L LR in the ED    Hospital Course:   - # Acute on chronic anemia likely due to GI bleed # Hx of iron def --Hgb 6.6, MCV 104.2 on presentation (Hgb 9-11 in Nov 2020).  Worsening anemia likely due to GI blood loss since pt has known colon cancer, and fecal occult positive. --Recent Vit B12 and folate wnl, iron was def. -Patient received a total of 2 units of packed red blood cells this admission, hemoglobin is up to 8.0 -Anticipate that hemoglobin will drop from time to time Isamah required transfusion from time to time repeat CBC at facility on Monday, 05/02/2019   # AKI on CKD 3b --Cr 2.39 on presentation, up from 1.42 about a week PTA. -Renal function has improved with hydration creatinine down to 1.40  -continue to avoid nephrotoxic agents  # Weakness and inability to transfer --PT eval appreciated, recommend SNF rehab  # BLE edema and anasarca --Lasix at home regimen, 60 mg in the morning and 40 mg at night.   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 Burr Medico.Status post concurrent chemoradiation for small cell lung cancer, status post whole brain radiation. She is not a surgical candidatefor colon cancer.  --Not a candidate for further treatment --- Patient's oncologist Dr. Burr Medico recommends palliative care and hospice involvement as there are no good treatment options at this time -Patient had conversations with palliative care team, patient is not ready to transition to full comfort measures or hospice at this time  # DM2 on insulin --Continue Lantus insulin along with sliding scale coverage  # Chronic pain on chronic opioids # Neuropathy --continue home opioids --continue  home gabapentin --continue home bowel regimen  # Anxiety and depression --continue home Xanax and Buspar  # COPD, stable # Chronic hypoxic respiratory failure on 2-3L at home  --continue home suppl O2 --continue home Anoro  Hypothyroidism:Continue Synthyroid  Morbid obesity: BMI of 44.6.  Discharge Condition: Stable, overall prognosis is grave given untreatable lung and colon malignancies  Follow UP--- oncologist Dr. Burr Medico post discharge from rehab facility   Consults obtained -phone consult with oncologist/palliative care consult  Diet and Activity recommendation:  As advised  Discharge Instructions    Discharge Instructions    Amb Referral to Palliative Care   Complete by: As directed    Call MD for:  difficulty breathing, headache or visual disturbances   Complete by: As directed    Call MD for:  persistant nausea and vomiting   Complete by: As directed    Call MD for:  severe uncontrolled pain   Complete by: As directed    Call MD for:  temperature >100.4   Complete by: As directed    Diet - low sodium heart healthy   Complete by: As directed    Diet Carb Modified   Complete by: As directed    Discharge  instructions   Complete by: As directed    1) repeat CBC blood test on Monday, 05/02/2019 2) palliative care and hospice consult at [facility] rehab facility advised 3) follow-up with oncologist Dr. Burr Medico post discharge from rehab facility 4)Avoid ibuprofen/Advil/Aleve/Motrin/Goody Powders/Naproxen/BC powders/Meloxicam/Diclofenac/Indomethacin and other Nonsteroidal anti-inflammatory medications as these will make you more likely to bleed and can cause stomach ulcers, can also cause Kidney problems.   Increase activity slowly   Complete by: As directed         Discharge Medications     Allergies as of 04/28/2019      Reactions   Dilaudid [hydromorphone Hcl] Itching   Midodrine Hcl Swelling   After one dose had anaphylactic reaction, had to call EMS.   Actifed Cold-allergy [chlorpheniramine-phenylephrine]    "I was sick and red and it didn't agree with me at all"   Doxycycline Nausea And Vomiting   Other Cough   Pt states she is allergic to ragweed and that she starts coughing and sneezing like crazy   Penicillins Hives, Itching   Tolerates Rocephin Has patient had a PCN reaction causing immediate rash, facial/tongue/throat swelling, SOB or lightheadedness with hypotension: Yes Has patient had a PCN reaction causing severe rash involving mucus membranes or skin necrosis: No Has patient had a PCN reaction that required hospitalization Yes Has patient had a PCN reaction occurring within the last 10 years: No If all of the above answers are "NO", then may proceed with Cephalosporin use.   Reglan [metoclopramide] Itching   Valium [diazepam] Itching   Vistaril [hydroxyzine Hcl] Itching      Medication List    STOP taking these medications   insulin lispro 100 UNIT/ML injection Commonly known as: HUMALOG   ondansetron 8 MG disintegrating tablet Commonly known as: ZOFRAN-ODT   oxyCODONE 5 MG immediate release tablet Commonly known as: Oxy IR/ROXICODONE     oxyCODONE-acetaminophen 10-325 MG tablet Commonly known as: PERCOCET Replaced by: oxyCODONE-acetaminophen 5-325 MG tablet     TAKE these medications   albuterol (2.5 MG/3ML) 0.083% nebulizer solution Commonly known as: PROVENTIL Take 2.5 mg by nebulization 2 (two) times daily as needed for wheezing or shortness of breath.   albuterol 108 (90 Base) MCG/ACT inhaler Commonly known as: ProAir HFA Inhale 2 puffs  into the lungs every 4 (four) hours as needed for wheezing or shortness of breath. For shortness of breath   allopurinol 300 MG tablet Commonly known as: ZYLOPRIM Take 300 mg by mouth daily.   ALPRAZolam 1 MG tablet Commonly known as: XANAX Take 1 tablet (1 mg total) by mouth 3 (three) times daily as needed for anxiety or sleep.   aspirin EC 81 MG tablet Take 81 mg by mouth daily.   atorvastatin 80 MG tablet Commonly known as: LIPITOR Take 1 tablet (80 mg total) by mouth daily at 6 PM.   busPIRone 7.5 MG tablet Commonly known as: BUSPAR Take 7.5 mg by mouth 2 (two) times daily.   cholecalciferol 25 MCG (1000 UT) tablet Commonly known as: VITAMIN D3 Take 5,000 Units by mouth daily.   diphenhydrAMINE-zinc acetate cream Commonly known as: BENADRYL Apply 1 application topically 3 (three) times daily as needed for itching. What changed: additional instructions   diphenoxylate-atropine 2.5-0.025 MG tablet Commonly known as: LOMOTIL Take 1 tablet by mouth 4 (four) times daily as needed for diarrhea or loose stools.   FISH OIL PO Take 1 capsule by mouth daily.   furosemide 40 MG tablet Commonly known as: LASIX Take 1 tablet (40 mg total) by mouth daily. What changed:   how much to take  how to take this  when to take this  additional instructions   gabapentin 600 MG tablet Commonly known as: NEURONTIN Take 600 mg by mouth 3 (three) times daily.   Gerhardt's butt cream Crea Apply 1 application topically 4 (four) times daily as needed for irritation.    hydrocortisone 2.5 % rectal cream Commonly known as: ANUSOL-HC Place 1 application rectally 4 (four) times daily as needed for hemorrhoids.   insulin aspart 100 UNIT/ML injection Commonly known as: novoLOG Inject 0-9 Units into the skin See admin instructions. insulin aspart (novoLOG) injection 0-9 Units 0-9 Units,  Subcutaneous, 3 times daily with meals, CBG < 70: Implement Hypoglycemia Standing Orders and refer to Hypoglycemia Standing Orders sidebar report  CBG 70 - 120: 0 units  CBG 121 - 150: 1 unit  CBG 151 - 200: 2 units  CBG 201 - 250: 3 units  CBG 251 - 300: 5 units  CBG 301 - 350: 7 units  CBG 351 - 400: 9 units CBG > 400: What changed:   how much to take  when to take this  additional instructions   insulin detemir 100 UNIT/ML injection Commonly known as: Levemir Inject 0.34 mLs (34 Units total) into the skin at bedtime. What changed: how much to take   levothyroxine 200 MCG tablet Commonly known as: SYNTHROID Take 1 tablet by mouth daily before breakfast.   nitroGLYCERIN 0.4 MG SL tablet Commonly known as: NITROSTAT Place 0.4 mg under the tongue every 5 (five) minutes as needed for chest pain.   nystatin powder Commonly known as: MYCOSTATIN/NYSTOP Apply topically 2 (two) times daily.   ondansetron 8 MG tablet Commonly known as: ZOFRAN Take 1 tablet (8 mg total) by mouth every 8 (eight) hours as needed for nausea or vomiting.   oxyCODONE-acetaminophen 5-325 MG tablet Commonly known as: PERCOCET/ROXICET Take 1 tablet by mouth every 6 (six) hours as needed for moderate pain or severe pain. Replaces: oxyCODONE-acetaminophen 10-325 MG tablet   OXYGEN Inhale 2 L into the lungs continuous.   pantoprazole 40 MG tablet Commonly known as: PROTONIX Take 1 tablet (40 mg total) by mouth 2 (two) times daily.   polyethylene glycol 17  g packet Commonly known as: MIRALAX / GLYCOLAX Take 17 g by mouth 2 (two) times daily.   potassium chloride SA 20 MEQ  tablet Commonly known as: KLOR-CON Take 1 tablet (20 mEq total) by mouth daily. What changed: when to take this   promethazine 12.5 MG tablet Commonly known as: PHENERGAN Take 1 tablet (12.5 mg total) by mouth every 6 (six) hours as needed for nausea or vomiting.   senna 8.6 MG Tabs tablet Commonly known as: SENOKOT Take 2 tablets (17.2 mg total) by mouth 2 (two) times daily. What changed:   how much to take  when to take this   silver sulfADIAZINE 1 % cream Commonly known as: SILVADENE Apply 1 application topically 2 (two) times daily.   sucralfate 1 g tablet Commonly known as: Carafate Take 1 tablet (1 g total) by mouth 4 (four) times daily -  with meals and at bedtime. 5 min before meals for radiation induced esophagitis   SURE COMFORT INS SYR 1CC/28G 28G X 1/2" 1 ML Misc Generic drug: INS SYRINGE/NEEDLE 1CC/28G USE TO INJECT INSULIN UP TO 4 TIMES DAILY.   topiramate 25 MG tablet Commonly known as: TOPAMAX Take 75 mg by mouth at bedtime.   umeclidinium-vilanterol 62.5-25 MCG/INH Aepb Commonly known as: ANORO ELLIPTA Inhale 1 puff into the lungs daily.       Major procedures and Radiology Reports - PLEASE review detailed and final reports for all details, in brief -   CT Head Wo Contrast  Result Date: 03/31/2019 CLINICAL DATA:  Patient with altered mental status.  Colon cancer. EXAM: CT HEAD WITHOUT CONTRAST TECHNIQUE: Contiguous axial images were obtained from the base of the skull through the vertex without intravenous contrast. COMPARISON:  Brain CT 01/06/2018. FINDINGS: Brain: Ventricles and sulci are prominent compatible with atrophy. Periventricular and subcortical white matter hypodensities compatible with chronic microvascular ischemic changes. No evidence for acute cortically based infarct, intracranial hemorrhage, mass lesion or mass-effect. Vascular: Unremarkable Skull: Intact. Sinuses/Orbits: Paranasal sinuses are well aerated. Mastoid air cells are  unremarkable. Orbits are unremarkable. Other: None. IMPRESSION: No acute intracranial process. Atrophy and chronic microvascular ischemic changes. Electronically Signed   By: Lovey Newcomer M.D.   On: 03/31/2019 18:28   MR BRAIN WO CONTRAST  Result Date: 04/01/2019 CLINICAL DATA:  Altered level of consciousness (LOC), unexplained altered mental status. EXAM: MRI HEAD WITHOUT CONTRAST TECHNIQUE: Multiplanar, multiecho pulse sequences of the brain and surrounding structures were obtained without intravenous contrast. COMPARISON:  Head CT 03/31/2019, brain MRI 03/03/2019. FINDINGS: Brain: There is mild to moderate motion degradation of multiple sequences, somewhat limiting evaluation. There is no convincing evidence of acute infarct. No midline shift or extra-axial fluid collection. A previously demonstrated subcentimeter hemorrhagic metastasis within the left posterior cerebellum, and 2 additional subcentimeter metastatic lesions within the right temporal lobe, were better appreciated on prior contrast-enhanced MRI brain 03/03/2019. Persistent small amount of vasogenic edema surrounding the temporal lobe lesions. Stable generalized parenchymal atrophy and chronic small vessel ischemic disease. Vascular: Flow voids maintained within the proximal large arterial vessels. Skull and upper cervical spine: No focal marrow lesion Sinuses/Orbits: Visualized orbits demonstrate no acute abnormality. Right mastoid effusion. IMPRESSION: 1. Motion degraded examination. 2. No evidence of acute infarct. 3. Subcentimeter metastatic lesions within the left cerebellum and right temporal lobe (3 total) were better appreciated on prior contrast-enhanced brain MRI 03/03/2019. 4. Stable generalized parenchymal atrophy and chronic small vessel ischemic disease. 5. Right mastoid effusion. Electronically Signed   By: Kellie Simmering  DO   On: 04/01/2019 07:59   DG Chest Portable 1 View  Result Date: 04/10/2019 CLINICAL DATA:  CHF EXAM:  PORTABLE CHEST 1 VIEW COMPARISON:  Radiograph 03/31/2019 FINDINGS: Increasing hazy interstitial opacities in the lung bases with mild cardiomegaly and indistinct pulmonary vascularity with few septal lines. Some atelectatic opacity is noted in the right lung base as well. No pneumothorax. No visible effusion. No acute osseous or soft tissue abnormality. IMPRESSION: Features suggest mild developing interstitial edema. Mild cardiomegaly. Electronically Signed   By: Lovena Le M.D.   On: 04/10/2019 00:11   DG Chest Port 1 View  Result Date: 03/31/2019 CLINICAL DATA:  Altered mental status EXAM: PORTABLE CHEST 1 VIEW COMPARISON:  September 2020 FINDINGS: Right lung is clear. Left hilar mass is not well evaluated. Patchy increased density in the left upper lung. No pleural effusion. No pneumothorax. Normal heart size for portable technique. IMPRESSION: Patchy density in the left upper lung that may reflect atelectasis/consolidation, potentially postobstructive given hilar mass on prior imaging. Electronically Signed   By: Macy Mis M.D.   On: 03/31/2019 17:04   ECHOCARDIOGRAM COMPLETE  Result Date: 04/11/2019   ECHOCARDIOGRAM REPORT   Patient Name:   TAKHIA SPOON Date of Exam: 04/11/2019 Medical Rec #:  [id number]       Height:       66.0 in Accession #:    [id number]      Weight:       276.5 lb Date of Birth:  1950/08/25       BSA:          2.29 m Patient Age:    56 years        BP:           177/71 mmHg Patient Gender: F               HR:           96 bpm. Exam Location:  Inpatient Procedure: 2D Echo Indications:    CHF-Acute Diastolic W97.98  History:        Patient has prior history of Echocardiogram examinations, most                 recent 07/05/2018. CAD, COPD; Risk Factors:Hypertension and                 Diabetes.  Sonographer:    Mikki Santee RDCS (AE) Referring Phys: [id number] AMRIT ADHIKARI IMPRESSIONS  1. Left ventricular ejection fraction, by visual estimation, is >75%. The left  ventricle has hyperdynamic function. There is mildly increased left ventricular hypertrophy.  2. Left ventricular diastolic parameters are consistent with Grade I diastolic dysfunction (impaired relaxation).  3. Global right ventricle has normal systolic function.The right ventricular size is normal. No increase in right ventricular wall thickness.  4. Left atrial size was normal.  5. Right atrial size was normal.  6. Mild mitral annular calcification.  7. The mitral valve is abnormal. No evidence of mitral valve regurgitation.  8. The tricuspid valve is normal in structure. Tricuspid valve regurgitation is trivial.  9. The aortic valve is tricuspid. Aortic valve regurgitation is trivial. Mild aortic valve sclerosis without stenosis. 10. The pulmonic valve was not well visualized. Pulmonic valve regurgitation is not visualized. 11. The inferior vena cava is normal in size with greater than 50% respiratory variability, suggesting right atrial pressure of 3 mmHg. FINDINGS  Left Ventricle: Left ventricular ejection fraction, by visual estimation, is >75%. The left ventricle has hyperdynamic  function. The left ventricular internal cavity size was the left ventricle is normal in size. There is mildly increased left ventricular hypertrophy. Left ventricular diastolic parameters are consistent with Grade I diastolic dysfunction (impaired relaxation). Right Ventricle: The right ventricular size is normal. No increase in right ventricular wall thickness. Global RV systolic function is has normal systolic function. Left Atrium: Left atrial size was normal in size. Right Atrium: Right atrial size was normal in size Pericardium: There is no evidence of pericardial effusion. Mitral Valve: The mitral valve is abnormal. There is mild thickening of the mitral valve leaflet(s). Mild mitral annular calcification. No evidence of mitral valve regurgitation. Tricuspid Valve: The tricuspid valve is normal in structure. Tricuspid valve  regurgitation is trivial. Aortic Valve: The aortic valve is tricuspid. Aortic valve regurgitation is trivial. Mild aortic valve sclerosis is present, with no evidence of aortic valve stenosis. Pulmonic Valve: The pulmonic valve was not well visualized. Pulmonic valve regurgitation is not visualized. Aorta: The aortic root is normal in size and structure. Venous: The inferior vena cava is normal in size with greater than 50% respiratory variability, suggesting right atrial pressure of 3 mmHg. IAS/Shunts: No atrial level shunt detected by color flow Doppler.  LEFT VENTRICLE PLAX 2D LVIDd:         4.00 cm  Diastology LVIDs:         2.88 cm  LV e' lateral:   7.51 cm/s LV PW:         1.44 cm  LV E/e' lateral: 10.6 LV IVS:        1.30 cm  LV e' medial:    4.90 cm/s LVOT diam:     2.00 cm  LV E/e' medial:  16.3 LV SV:         38 ml LV SV Index:   15.43 LVOT Area:     3.14 cm  RIGHT VENTRICLE RV S prime:     9.79 cm/s TAPSE (M-mode): 1.4 cm LEFT ATRIUM             Index       RIGHT ATRIUM          Index LA diam:        3.90 cm 1.70 cm/m  RA Area:     8.66 cm LA Vol (A2C):   47.5 ml 20.70 ml/m RA Volume:   17.60 ml 7.67 ml/m LA Vol (A4C):   44.6 ml 19.41 ml/m LA Biplane Vol: 58.3 ml 25.41 ml/m  AORTIC VALVE LVOT Vmax:   117.00 cm/s LVOT Vmean:  72.000 cm/s LVOT VTI:    0.268 m  AORTA Ao Root diam: 3.40 cm MITRAL VALVE                         TRICUSPID VALVE MV Area (PHT): 1.98 cm              TR Peak grad:   26.0 mmHg MV PHT:        111.36 msec           TR Vmax:        255.00 cm/s MV Decel Time: 384 msec MV E velocity: 79.80 cm/s  103 cm/s  SHUNTS MV A velocity: 126.00 cm/s 70.3 cm/s Systemic VTI:  0.27 m MV E/A ratio:  0.63        1.5       Systemic Diam: 2.00 cm  Dorris Carnes MD Electronically signed by Dorris Carnes MD Signature  Date/Time: 04/11/2019/5:32:59 PM    Final     Micro Results    Recent Results (from the past 240 hour(s))  SARS CORONAVIRUS 2 (TAT 6-24 HRS) Nasopharyngeal Nasopharyngeal Swab      Status: None   Collection Time: 04/25/19 10:08 AM   Specimen: Nasopharyngeal Swab  Result Value Ref Range Status   SARS Coronavirus 2 NEGATIVE NEGATIVE Final    Comment: (NOTE) SARS-CoV-2 target nucleic acids are NOT DETECTED. The SARS-CoV-2 RNA is generally detectable in upper and lower respiratory specimens during the acute phase of infection. Negative results do not preclude SARS-CoV-2 infection, do not rule out co-infections with other pathogens, and should not be used as the sole basis for treatment or other patient management decisions. Negative results must be combined with clinical observations, patient history, and epidemiological information. The expected result is Negative. Fact Sheet for Patients: SugarRoll.be Fact Sheet for Healthcare Providers: https://www.woods-mathews.com/ This test is not yet approved or cleared by the [place] FDA and  has been authorized for detection and/or diagnosis of SARS-CoV-2 by FDA under an Emergency Use Authorization (EUA). This EUA will remain  in effect (meaning this test can be used) for the duration of the COVID-19 declaration under Section 56 4(b)(1) of the Act, 21 U.S.C. section 360bbb-3(b)(1), unless the authorization is terminated or revoked sooner. Performed at [facility], [place] [address] [place], [place] [address]        Today   Subjective    Del Wiseman today has no new complaints -Eating and drinking okay  No Nausea, Vomiting or Diarrhea  No fever  Or chills           Patient has been seen and examined prior to discharge   Objective   Blood pressure (!) 120/54, pulse 96, temperature 97.9 F (36.6 C), temperature source Oral, resp. rate 19, height 5' (1.524 m), weight 132.6 kg, SpO2 98 %.   Intake/Output Summary (Last 24 hours) at 04/28/2019 1147 Last data filed at 04/28/2019 0100 Gross per 24 hour  Intake 1036 ml  Output 900 ml  Net 136 ml     Exam Gen:- Awake Alert, no acute distress , obese HEENT:- [place].AT, No sclera icterus Neck-Supple Neck,No JVD,.  Lungs-  CTAB , good air movement bilaterally  CV- S1, S2 normal, regular Abd-  +ve B.Sounds, Abd Soft, No tenderness, increased truncal adiposity Extremity/Skin:-Trace edema,   good pulses Psych-affect is appropriate, oriented x3 Neuro-generalized weakness, no new focal deficits, no tremors    Data Review   CBC w Diff:  Lab Results  Component Value Date   WBC 5.5 04/28/2019   HGB 8.0 (L) 04/28/2019   HGB 9.3 (L) 03/09/2019   HCT 26.7 (L) 04/28/2019   HCT 31.0 (L) 02/01/2017   PLT 251 04/28/2019   PLT 211 03/09/2019   LYMPHOPCT 11 04/14/2019   MONOPCT 9 04/14/2019   EOSPCT 3 04/14/2019   BASOPCT 1 04/14/2019    CMP:  Lab Results  Component Value Date   NA 137 04/28/2019   K 4.7 04/28/2019   CL 99 04/28/2019   CO2 27 04/28/2019   BUN 37 (H) 04/28/2019   BUN 28 (A) 11/11/2016   CREATININE 1.40 (H) 04/28/2019   CREATININE 1.44 (H) 03/09/2019   PROT 5.6 (L) 04/26/2019   ALBUMIN 2.7 (L) 04/26/2019   BILITOT 0.6 04/26/2019   BILITOT <0.2 (L) 03/09/2019   ALKPHOS 77 04/26/2019   AST 15 04/26/2019   AST 9 (L) 03/09/2019   ALT 11  04/26/2019   ALT 7 03/09/2019    Total Discharge time is about 33 minutes  Roxan Hockey M.D on 04/28/2019 at 11:47 AM  Go to [email or web address] -  for contact info  [facility] - Office  [phone]

## 2019-04-29 LAB — TYPE AND SCREEN
ABO/RH(D): A POS
Antibody Screen: NEGATIVE
Donor AG Type: NEGATIVE
Donor AG Type: NEGATIVE
Donor AG Type: NEGATIVE
Unit division: 0
Unit division: 0
Unit division: 0

## 2019-04-29 LAB — BPAM RBC
Blood Product Expiration Date: 202012292359
Blood Product Expiration Date: 202012292359
Blood Product Expiration Date: 202101232359
ISSUE DATE / TIME: 202012141700
ISSUE DATE / TIME: 202012162031
Unit Type and Rh: 6200
Unit Type and Rh: 6200
Unit Type and Rh: 6200

## 2019-05-03 DIAGNOSIS — C3490 Malignant neoplasm of unspecified part of unspecified bronchus or lung: Secondary | ICD-10-CM | POA: Diagnosis not present

## 2019-05-03 DIAGNOSIS — E119 Type 2 diabetes mellitus without complications: Secondary | ICD-10-CM | POA: Diagnosis not present

## 2019-05-03 DIAGNOSIS — G629 Polyneuropathy, unspecified: Secondary | ICD-10-CM | POA: Diagnosis not present

## 2019-05-03 DIAGNOSIS — J449 Chronic obstructive pulmonary disease, unspecified: Secondary | ICD-10-CM | POA: Diagnosis not present

## 2019-05-03 DIAGNOSIS — L89159 Pressure ulcer of sacral region, unspecified stage: Secondary | ICD-10-CM | POA: Diagnosis not present

## 2019-05-04 DIAGNOSIS — J449 Chronic obstructive pulmonary disease, unspecified: Secondary | ICD-10-CM | POA: Diagnosis not present

## 2019-05-04 DIAGNOSIS — G629 Polyneuropathy, unspecified: Secondary | ICD-10-CM | POA: Diagnosis not present

## 2019-05-04 DIAGNOSIS — L89159 Pressure ulcer of sacral region, unspecified stage: Secondary | ICD-10-CM | POA: Diagnosis not present

## 2019-05-04 DIAGNOSIS — E119 Type 2 diabetes mellitus without complications: Secondary | ICD-10-CM | POA: Diagnosis not present

## 2019-05-04 DIAGNOSIS — C3490 Malignant neoplasm of unspecified part of unspecified bronchus or lung: Secondary | ICD-10-CM | POA: Diagnosis not present

## 2019-05-06 DIAGNOSIS — R5381 Other malaise: Secondary | ICD-10-CM | POA: Diagnosis not present

## 2019-05-06 DIAGNOSIS — R69 Illness, unspecified: Secondary | ICD-10-CM | POA: Diagnosis not present

## 2019-05-09 DIAGNOSIS — T1490XA Injury, unspecified, initial encounter: Secondary | ICD-10-CM | POA: Diagnosis not present

## 2019-05-09 DIAGNOSIS — R5381 Other malaise: Secondary | ICD-10-CM | POA: Diagnosis not present

## 2019-05-09 DIAGNOSIS — W19XXXA Unspecified fall, initial encounter: Secondary | ICD-10-CM | POA: Diagnosis not present

## 2019-05-10 ENCOUNTER — Telehealth: Payer: Self-pay

## 2019-05-10 ENCOUNTER — Encounter: Payer: Self-pay | Admitting: Hematology

## 2019-05-10 ENCOUNTER — Telehealth: Payer: Self-pay | Admitting: *Deleted

## 2019-05-10 ENCOUNTER — Other Ambulatory Visit: Payer: Self-pay | Admitting: *Deleted

## 2019-05-10 ENCOUNTER — Inpatient Hospital Stay (HOSPITAL_BASED_OUTPATIENT_CLINIC_OR_DEPARTMENT_OTHER): Payer: Medicare Other | Admitting: Hematology

## 2019-05-10 DIAGNOSIS — D649 Anemia, unspecified: Secondary | ICD-10-CM

## 2019-05-10 DIAGNOSIS — R5381 Other malaise: Secondary | ICD-10-CM | POA: Diagnosis not present

## 2019-05-10 DIAGNOSIS — T1490XA Injury, unspecified, initial encounter: Secondary | ICD-10-CM | POA: Diagnosis not present

## 2019-05-10 DIAGNOSIS — E1165 Type 2 diabetes mellitus with hyperglycemia: Secondary | ICD-10-CM | POA: Diagnosis not present

## 2019-05-10 DIAGNOSIS — W19XXXA Unspecified fall, initial encounter: Secondary | ICD-10-CM | POA: Diagnosis not present

## 2019-05-10 MED ORDER — ALPRAZOLAM 1 MG PO TABS: 1.0000 mg | ORAL_TABLET | Freq: Three times a day (TID) | ORAL | 0 refills | Status: DC | PRN

## 2019-05-10 MED ORDER — TIZANIDINE HCL 4 MG PO TABS: 4.0000 mg | ORAL_TABLET | Freq: Three times a day (TID) | ORAL | 1 refills | Status: DC | PRN

## 2019-05-10 MED ORDER — OXYCODONE HCL 5 MG PO TABS: 5.0000 mg | ORAL_TABLET | Freq: Four times a day (QID) | ORAL | 0 refills | Status: DC | PRN

## 2019-05-10 NOTE — Telephone Encounter (Signed)
Telephone call to patients daughter [name] to schedule palliative care visit.  Daughter in agreement with palliative care team visiting on 05/10/19 at 10:00 AM.

## 2019-05-10 NOTE — Progress Notes (Signed)
[facility]   [phone] [phone]   Clinic Follow up Note   Patient Care Team: [name], MD as PCP - General (Internal Medicine) Herminio Commons, MD as PCP - Cardiology (Cardiology) Truitt Merle, MD as Consulting Physician (Hematology) Phillips Odor, MD as Consulting Physician (Neurology)   I connected with Stephanie Combs on 05/10/2019 at 11:40 AM EST by telephone visit and verified that I am speaking with the correct person using two identifiers.  I discussed the limitations, risks, security and privacy concerns of performing an evaluation and management service by telephone and the availability of in person appointments. I also discussed with the patient that there may be a patient responsible charge related to this service. The patient expressed understanding and agreed to proceed.   Patient's location:  Her home  Provider's location:  My Office   CHIEF COMPLAINT: F/u Small cell lung cancerand colon cancer  SUMMARY OF ONCOLOGIC HISTORY: Oncology History Overview Note  Cancer Staging Primary cancer of hepatic flexure of colon ([place]) Staging form: Colon and Rectum, AJCC 8th Edition - Clinical stage from 09/09/2018: Stage Unknown (cTX, cN0, cM0) - Signed by Truitt Merle, MD on 09/28/2018  Small cell lung cancer, left upper lobe ([place]) Staging form: Lung, AJCC 8th Edition - Clinical stage from 09/14/2018: Stage IIIB (cT3, cN2, cM0) - Signed by Truitt Merle, MD on 09/28/2018     Primary cancer of hepatic flexure of colon [facility])   Initial Diagnosis   Malignant neoplasm of hepatic flexure ([place])   09/09/2018 Procedure   Coloscopy by Dr Gala Romney 09/09/18  IMPRESSION -Colon mass as described in the vicinity of the hepatic flexure - status post biopsy and inking. Polyps associated with this tumor scattered all the way to the cecum were not removed; multiple splenic flexure and rectal polyps were removed as described above. Left-sided  diverticulosis.  Upper endoscopy By Dr Gala Romney 09/09/18  IMPRESSION -Abnormal distal esophagus suspicious for Barrett's esophagus?biopsied. - Small hiatal hernia. - Erosive gastropathy. Biopsied. -Duodenal bulbar erosions   09/09/2018 Initial Biopsy   Diagnosis 09/09/18 1. Stomach, biopsy - GASTRIC ANTRAL MUCOSA WITH MILD NONSPECIFIC REACTIVE GASTROPATHY - GASTRIC OXYNTIC MUCOSA WITH PARIETAL CELL HYPERPLASIA AS CAN BE SEEN IN HYPERGASTRINEMIC STATES SUCH AS PROPER PATIENT IDENTIFICATION THERAPY. - WARTHIN STARRY STAIN IS NEGATIVE FOR HELICOBACTER PYLORI 2. Esophagus, biopsy - BARRETT'S ESOPHAGUS, NEGATIVE FOR DYSPLASIA 3. Colon, polyp(s), descending - TUBULAR ADENOMA - NEGATIVE FOR HIGH-GRADE DYSPLASIA OR MALIGNANCY 4. Colon, biopsy, hepatic flexure - ADENOCARCINOMA. SEE NOTE 5. Colon, polyp(s), splenic flexure - ADENOCARCINOMA. SEE NOTE - OTHER FRAGMENTS OF TUBULAR ADENOMA(S) WITH ONE SHOWING HIGH-GRADE DYSPLASIA 6. Rectum, polyp(s) - TUBULAR ADENOMA(S) - NEGATIVE FOR HIGH-GRADE DYSPLASIA OR MALIGNANCY   09/09/2018 Cancer Staging   Staging form: Colon and Rectum, AJCC 8th Edition - Clinical stage from 09/09/2018: Stage Unknown (cTX, cN0, cM0) - Signed by Truitt Merle, MD on 09/28/2018   09/11/2018 Imaging   CT CAP 09/11/18  IMPRESSION: 1. Central perihilar left upper lobe 5.4 cm lung mass, new since 01/27/2017 chest CT, compatible with malignancy, favor primary bronchogenic carcinoma. Postobstructive pneumonia/atelectasis in the lingula. 2. Mediastinal adenopathy in the left prevascular/AP window and subcarinal stations, compatible with metastatic nodes (more likely originating from the left upper lobe lung mass). 3. Trace dependent left pleural effusion. 4. Apple-core lesion in the ascending colon, compatible with known primary colonic neoplasm. Adjacent top-normal size right mesenteric lymph node is equivocal for locoregional nodal metastasis. No additional potential findings of  metastatic disease  in the abdomen or pelvis. 5. Chronic findings include: Aortic Atherosclerosis (ICD10-I70.0). Borderline mild cardiomegaly. One vessel coronary atherosclerosis. Dilated main pulmonary artery, suggesting pulmonary arterial hypertension. Left adrenal adenoma. Punctate bilateral nephrolithiasis. Small hiatal hernia.   09/20/2018 Imaging   Brain MRI 09/20/18  IMPRESSION: 1. Motion degraded examination without brain metastases identified. 2. Small skull metastases not excluded. 3. Moderate to severe chronic small vessel ischemic disease.   Small cell lung cancer, left upper lobe ([place])  09/11/2018 Imaging   CT CAP 09/11/18  IMPRESSION: 1. Central perihilar left upper lobe 5.4 cm lung mass, new since 01/27/2017 chest CT, compatible with malignancy, favor primary bronchogenic carcinoma. Postobstructive pneumonia/atelectasis in the lingula. 2. Mediastinal adenopathy in the left prevascular/AP window and subcarinal stations, compatible with metastatic nodes (more likely originating from the left upper lobe lung mass). 3. Trace dependent left pleural effusion. 4. Apple-core lesion in the ascending colon, compatible with known primary colonic neoplasm. Adjacent top-normal size right mesenteric lymph node is equivocal for locoregional nodal metastasis. No additional potential findings of metastatic disease in the abdomen or pelvis. 5. Chronic findings include: Aortic Atherosclerosis (ICD10-I70.0). Borderline mild cardiomegaly. One vessel coronary atherosclerosis. Dilated main pulmonary artery, suggesting pulmonary arterial hypertension. Left adrenal adenoma. Punctate bilateral nephrolithiasis. Small hiatal hernia.    09/14/2018 Initial Biopsy   Diagnosis 5/5.20 Lung, biopsy, LUL - SMALL CELL CARCINOMA.   09/14/2018 Cancer Staging   Staging form: Lung, AJCC 8th Edition - Clinical stage from 09/14/2018: Stage IIIB (cT3, cN2, cM0) - Signed by Truitt Merle, MD on 09/28/2018    09/15/2018 Initial Diagnosis   Small cell lung cancer, left upper lobe ([place])   09/18/2018 - 12/17/2018 Chemotherapy   Carboplatin and etoposide every 3 weeks for 4 cycles starting 09/18/18. Held after C2D1 due to COVID-19(+). She restarted with cycle 3 on 11/03/18. Completed 5 cycles on 12/17/18.  RT was concurrnet but started on 11/24/18.   09/20/2018 Imaging   Brain MRI 09/20/18  IMPRESSION: 1. Motion degraded examination without brain metastases identified. 2. Small skull metastases not excluded. 3. Moderate to severe chronic small vessel ischemic disease.    11/24/2018 - 01/06/2019 Radiation Therapy    RT was postponed to 11/24/18. Only concurrent with chemo for 2 cycles. Completed on 01/06/19   03/03/2019 Imaging   MRI Brain  IMPRESSION: Interval development of 3 metastatic deposits in the brain as above   Atrophy and moderate chronic microvascular ischemic changes in the white matter.   03/16/2019 - 03/29/2019 Radiation Therapy   RT to Brain with Dr. Lisbeth Renshaw 03/16/19-03/29/19      CURRENT THERAPY:  Observation  INTERVAL HISTORY:  Stephanie Combs is here for a follow up.   She was in [facility] 2 weeks ago for her severe GI bleeding. She notes she had 4 BM around that times and no bleeding. She notes her bleeding was from her hemorrhoids. She notes she is currently at home. She feels she should have had Lasix push to help her LE edema and the weakness of her legs. She was in [facility] for 1 week after her hospitalization because she wanted to be home for Christmas. She wants to return now and plans to speak with Genevie Ann at 3pm today.   She notes she has bone soreness and toe nails are turning black. She still has significant weakness of the legs. She is trying to get family to help her at home for now until she can get her back to rehab center.  Rhae Lerner  a community EMT with department of health and human service for [employer] is checking on her today. She was  referred to her by EMS. [facility] organization was recommended as an option for her.    REVIEW OF SYSTEMS:   Constitutional: Denies fevers, chills or abnormal weight loss Eyes: Denies blurriness of vision Ears, nose, mouth, throat, and face: Denies mucositis or sore throat Respiratory: Denies cough, dyspnea or wheezes Cardiovascular: Denies palpitation, chest discomfort or lower extremity swelling Gastrointestinal:  Denies nausea, heartburn or change in bowel habits Skin: Denies abnormal skin rashes Lymphatics: Denies new lymphadenopathy or easy bruising Neurological:Denies numbness, tingling or new weaknesses Behavioral/Psych: Mood is stable, no new changes  All other systems were reviewed with the patient and are negative.  MEDICAL HISTORY:  Past Medical History:  Diagnosis Date  . Anxiety   . CAD (coronary artery disease)    a. s/p DES to LAD and angioplasty to D1 in 05/2017  . Cancer ([place])   . CHF (congestive heart failure) (H[facility])    Diastolic  . Chronic back pain   . Chronic pain   . COPD (chronic obstructive pulmonary disease) ([place])   . Degenerative disk disease   . Diabetes mellitus without complication ([place])   . History of medication noncompliance 11/2017   "the patient frequently self-adjusts medications without notifying her physicians"  . Hypertension   . Hypothyroidism   . On home O2    2.5 L N/C prn  . Pedal edema     SURGICAL HISTORY: Past Surgical History:  Procedure Laterality Date  . BIOPSY  09/09/2018   Procedure: BIOPSY;  Surgeon: Daneil Dolin, MD;  [facility];  Service: Endoscopy;;  gastric esophageal hepatic flexure colon  . COLONOSCOPY WITH PROPOFOL N/A 09/09/2018   hepatic flexure colon mass s/p biopsy, with polyps associated with tumor scattered all the way to the cecum not removed, multiple splenic flexure and rectal polyps removed, left-sided diverticulosis, ?right hemicolectomy. Hepatic flexure adenocarcinoma and additional  adenocarcinoma in small splenic flexure polyp. Concern for synchronous colon cancer. High risk for surgical resection.   . CORONARY STENT INTERVENTION N/A 05/22/2017   Procedure: CORONARY STENT INTERVENTION;  Surgeon: Martinique, Peter M, MD;  Location: [facility];  Service: Cardiovascular;  Laterality: N/A;  . ESOPHAGOGASTRODUODENOSCOPY (EGD) WITH PROPOFOL N/A 09/09/2018   abnormal distal esophagus suspicious for Barrett's s/p biopsy, erosive gastropathy, duodenal bulbar erosions. +Barrett's  . HERNIA REPAIR    . POLYPECTOMY  09/09/2018   Procedure: POLYPECTOMY;  Surgeon: Daneil Dolin, MD;  [facility];  Service: Endoscopy;;  colon  . RIGHT/LEFT HEART CATH AND CORONARY ANGIOGRAPHY N/A 05/19/2017   Procedure: RIGHT/LEFT HEART CATH AND CORONARY ANGIOGRAPHY;  Surgeon: Leonie Man, MD;  Location: [place] [facility];  Service: Cardiovascular;  Laterality: N/A;  . TUBAL LIGATION    . VIDEO BRONCHOSCOPY WITH ENDOBRONCHIAL ULTRASOUND Left 09/14/2018   Procedure: VIDEO BRONCHOSCOPY WITH ENDOBRONCHIAL ULTRASOUND with biopsies;  Surgeon: Margaretha Seeds, MD;  Location: [facility];  Service: Thoracic;  Laterality: Left;    I have reviewed the social history and family history with the patient and they are unchanged from previous note.  ALLERGIES:  is allergic to dilaudid [hydromorphone hcl]; midodrine hcl; actifed cold-allergy [chlorpheniramine-phenylephrine]; doxycycline; other; penicillins; reglan [metoclopramide]; valium [diazepam]; and vistaril [hydroxyzine hcl].  MEDICATIONS:  Current Outpatient Medications  Medication Sig Dispense Refill  . albuterol (PROAIR HFA) 108 (90 Base) MCG/ACT inhaler Inhale 2 puffs into the lungs every  4 (four) hours as needed for wheezing or shortness of breath. For shortness of breath 18 g 0  . albuterol (PROVENTIL) (2.5 MG/3ML) 0.083% nebulizer solution Take 2.5 mg by nebulization 2 (two) times daily as needed for wheezing or shortness of breath.     .  allopurinol (ZYLOPRIM) 300 MG tablet Take 300 mg by mouth daily.    Marland Kitchen ALPRAZolam (XANAX) 1 MG tablet Take 1 tablet (1 mg total) by mouth 3 (three) times daily as needed for anxiety or sleep. 60 tablet 0  . aspirin EC 81 MG tablet Take 81 mg by mouth daily.    Marland Kitchen atorvastatin (LIPITOR) 80 MG tablet Take 1 tablet (80 mg total) by mouth daily at 6 PM. 30 tablet 0  . busPIRone (BUSPAR) 7.5 MG tablet Take 7.5 mg by mouth 2 (two) times daily.    . cholecalciferol (VITAMIN D3) 25 MCG (1000 UT) tablet Take 5,000 Units by mouth daily.    . diphenhydrAMINE-zinc acetate (BENADRYL) cream Apply 1 application topically 3 (three) times daily as needed for itching. (Patient taking differently: Apply 1 application topically 3 (three) times daily as needed for itching. Patient uses on her head for dry scalp.) 28.4 g 2  . diphenoxylate-atropine (LOMOTIL) 2.5-0.025 MG tablet Take 1 tablet by mouth 4 (four) times daily as needed for diarrhea or loose stools.     . furosemide (LASIX) 40 MG tablet Take 1 tablet (40 mg total) by mouth daily. 30 tablet 1  . gabapentin (NEURONTIN) 600 MG tablet Take 600 mg by mouth 3 (three) times daily.     . hydrocortisone (ANUSOL-HC) 2.5 % rectal cream Place 1 application rectally 4 (four) times daily as needed for hemorrhoids.     . Hydrocortisone (GERHARDT'S BUTT CREAM) CREA Apply 1 application topically 4 (four) times daily as needed for irritation. 1 each 3  . insulin aspart (NOVOLOG) 100 UNIT/ML injection Inject 0-9 Units into the skin See admin instructions. insulin aspart (novoLOG) injection 0-9 Units 0-9 Units,  Subcutaneous, 3 times daily with meals, CBG < 70: Implement Hypoglycemia Standing Orders and refer to Hypoglycemia Standing Orders sidebar report  CBG 70 - 120: 0 units  CBG 121 - 150: 1 unit  CBG 151 - 200: 2 units  CBG 201 - 250: 3 units  CBG 251 - 300: 5 units  CBG 301 - 350: 7 units  CBG 351 - 400: 9 units CBG > 400: 10 mL 11  . insulin detemir (LEVEMIR) 100  UNIT/ML injection Inject 0.34 mLs (34 Units total) into the skin at bedtime. 10 mL 0  . levothyroxine (SYNTHROID, LEVOTHROID) 200 MCG tablet Take 1 tablet by mouth daily before breakfast.     . nitroGLYCERIN (NITROSTAT) 0.4 MG SL tablet Place 0.4 mg under the tongue every 5 (five) minutes as needed for chest pain.     Marland Kitchen nystatin (MYCOSTATIN/NYSTOP) powder Apply topically 2 (two) times daily. 30 g 0  . Omega-3 Fatty Acids (FISH OIL PO) Take 1 capsule by mouth daily.     . ondansetron (ZOFRAN) 8 MG tablet Take 1 tablet (8 mg total) by mouth every 8 (eight) hours as needed for nausea or vomiting. 15 tablet 2  . oxyCODONE (OXY IR/ROXICODONE) 5 MG immediate release tablet Take 1-2 tablets (5-10 mg total) by mouth every 6 (six) hours as needed for severe pain. 90 tablet 0  . oxyCODONE-acetaminophen (PERCOCET/ROXICET) 5-325 MG tablet Take 1 tablet by mouth every 6 (six) hours as needed for moderate pain or severe pain.  15 tablet 0  . OXYGEN Inhale 2 L into the lungs continuous.    . pantoprazole (PROTONIX) 40 MG tablet Take 1 tablet (40 mg total) by mouth 2 (two) times daily. 180 tablet 3  . polyethylene glycol (MIRALAX / GLYCOLAX) 17 g packet Take 17 g by mouth 2 (two) times daily. 30 each 0  . potassium chloride SA (KLOR-CON) 20 MEQ tablet Take 1 tablet (20 mEq total) by mouth daily. 30 tablet 1  . promethazine (PHENERGAN) 12.5 MG tablet Take 1 tablet (12.5 mg total) by mouth every 6 (six) hours as needed for nausea or vomiting. 45 tablet 1  . senna (SENOKOT) 8.6 MG TABS tablet Take 2 tablets (17.2 mg total) by mouth 2 (two) times daily. 120 tablet 0  . silver sulfADIAZINE (SILVADENE) 1 % cream Apply 1 application topically 2 (two) times daily.     . sucralfate (CARAFATE) 1 g tablet Take 1 tablet (1 g total) by mouth 4 (four) times daily -  with meals and at bedtime. 5 min before meals for radiation induced esophagitis 120 tablet 2  . SURE COMFORT INS SYR 1CC/28G 28G X 1/2" 1 ML MISC USE TO INJECT INSULIN  UP TO 4 TIMES DAILY. 150 each 5  . tiZANidine (ZANAFLEX) 4 MG tablet Take 1 tablet (4 mg total) by mouth every 8 (eight) hours as needed for muscle spasms. 30 tablet 1  . topiramate (TOPAMAX) 25 MG tablet Take 75 mg by mouth at bedtime.     Marland Kitchen umeclidinium-vilanterol (ANORO ELLIPTA) 62.5-25 MCG/INH AEPB Inhale 1 puff into the lungs daily. 30 each 0   No current facility-administered medications for this visit.    PHYSICAL EXAMINATION: ECOG PERFORMANCE STATUS: 4 - Bedbound  No vitals taken today, Exam not performed today   LABORATORY DATA:  I have reviewed the data as listed CBC Latest Ref Rng & Units 04/28/2019 04/28/2019 04/27/2019  WBC 4.0 - 10.5 K/uL 5.5 5.2 4.2  Hemoglobin 12.0 - 15.0 g/dL 8.0(L) 7.7(L) 6.7(LL)  Hematocrit 36.0 - 46.0 % 26.7(L) 25.2(L) 22.2(L)  Platelets 150 - 400 K/uL 251 218 225     CMP Latest Ref Rng & Units 04/28/2019 04/27/2019 04/26/2019  Glucose 70 - 99 mg/dL 295(H) 299(H) 346(H)  BUN 8 - 23 mg/dL 37(H) 40(H) 53(H)  Creatinine 0.44 - 1.00 mg/dL 1.40(H) 1.50(H) 1.80(H)  Sodium 135 - 145 mmol/L 137 136 135  Potassium 3.5 - 5.1 mmol/L 4.7 4.8 5.2(H)  Chloride 98 - 111 mmol/L 99 100 101  CO2 22 - 32 mmol/L '27 28 25  ' Calcium 8.9 - 10.3 mg/dL 8.8(L) 8.6(L) 8.6(L)  Total Protein 6.5 - 8.1 g/dL - - 5.6(L)  Total Bilirubin 0.3 - 1.2 mg/dL - - 0.6  Alkaline Phos 38 - 126 U/L - - 77  AST 15 - 41 U/L - - 15  ALT 0 - 44 U/L - - 11      RADIOGRAPHIC STUDIES: I have personally reviewed the radiological images as listed and agreed with the findings in the report. No results found.   ASSESSMENT & PLAN:  Stephanie Combs is a 68 y.o. female with   1. Small Cell Lung cancer, LUL, cT3N2M0, new brain metastasis in 02/2019 -She was diagnosedin 5/2020during her work up for colon cancer -She has completed concurrent chemo and radiation, with significant chemo dose reduction.She completed radiation therapy on 01/06/19.She overall tolerated both treatment  moderately well.  -Unfortunately her MRI brain from 03/03/19 shows interval development of 3 metastatic deposits in the  brain.unfortunately she has incurable metastatic disease now   -She underwent RT to brain with Dr. Lisbeth Renshaw in 03/2019. She is currently on observation.  -Her performance status has declined since brain radiation, she was recently hospitalized and has significant LE edema, leg weakness and minimal ambulation. I do not think she is a candidate for more chemotherapy at this time due to her poor PS.  -After recent hospitalization for GI bleeding she was able to go to [facility]. She was there for 1 week but wanted to go home for Christmas and left AMA. She now wants to return to rehab for more assistance but rehab will not take her back (my nurse called).  -I recommend restarting palliative care and again strongly encourage her to consider hospice home care. She agree with resume palliative care -I spoke with [facility] Rhae Lerner with integrated health care who was there to check on her today through department of health and human service for [facility]. She can suggest her social work case be opened to see what resources are available to help her.  -f/u in 2-3 weeks in office    2. 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  -Unfortunately she is not a candidate for colon cancer surgery due to very high risk ofsurgical mortality.I discussed the incurablenature of her colon cancer without surgery.  -She was recently hospitalized for lower GI bleeding, probably related to her colon cancer. -Due to her poor performance status, she is not a candidate for chemotherapy, will continue supportive care.  3. 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 -Last bloodtransfusionon 01/05/19. Continue monitoring -She was hospitalized on 04/25/19 for GI bleeding. She required blood transfusion on 04/29/19.   4. CAD, CHF, HTN, significant LE edema, CKD -OnLipitor, lasix, nitro. Continue to f/u with cardiologist. -For her Edema she is currently on alternating Lasix doseLasix 70m in the AM and 232min the PM. -She was recently hospitalized in 03/2019 for her worsening LE. She was given IV Lasix. Since discharge she has not been able to ambulate much due to leg weakness.  -[facility] and wants to return.   5.DM,hypothyroidism, morbid obesity -On insulin novolog and long acting levemir. OnLevothyroxine.  6.Rheumatoidarthritis, chronic back pain  -Her pain medication has been managed by Dr. [place] has been released due to her terminal cancer. I will take over management of her pain meds.  -She is currently on Percocet [id number]. I filled on 04/22/19   7. Depression and anxiety -She has been more depressed with diagnosis of 2 cancers -She is currently on Celebrex and Xanax TID. Her medication is no longer being managed by Dr. DooMerlene Laughter will take over. I filled her Xanax on 04/22/19.   8.Goal of care discussion, DNR/DNI -The patient understands the goal of care is palliative. -I recommend DNR/DNI,she has agreed to DRI/DNR.She has written will and has established her daughter as her POA.  9.History ofCOVID-19 -She was incidentally found to be positive for COVID-19on 10/11/2018, no respiratory symptoms or evidence of pneumonia,she hasGIsymptoms which could be relate -Her testing on 11/04/18 was positive again. -Negative on 03/31/19, 04/10/19, 04/25/19   10. Pain management  -Her pain specialist has stopped prescribing her Percocet and Xanax due to her recent developing of metastatic disease,  I spoke with them recently. -I  refilled oxycodone 5-50m Q6H prn, and xanax,  will continue manage her pain.    Plan: -resume palliative care at home, I encourage her to consider hospice  -she wants to go back to [facility] but they will not take her back since she left AMA -lab and f/u in 2-3 weeks    No problem-specific Assessment & Plan notes found for this encounter.   No orders of the defined types were placed in this encounter.  I discussed the assessment and treatment plan with the patient. The patient was provided an opportunity to ask questions and all were answered. The patient agreed with the plan and demonstrated an understanding of the instructions.  The patient was advised to call back or seek an in-person evaluation if the symptoms worsen or if the condition fails to improve as anticipated.  I provided 25 minutes of non face-to-face telephone visit time during this encounter, and > 50% was spent counseling as documented under my assessment & plan.    YTruitt Merle MD 05/10/2019   I, AJoslyn Devon am acting as scribe for YTruitt Merle MD.   I have reviewed the above documentation for accuracy and completeness, and I agree with the above.

## 2019-05-10 NOTE — Telephone Encounter (Signed)
[facility] notified that patient is back at home. Checked herself out of [facility]. They will not take her back.  [facility] was aware of the situation and will be following Stephanie Combs

## 2019-05-11 ENCOUNTER — Other Ambulatory Visit: Payer: Self-pay

## 2019-05-11 ENCOUNTER — Telehealth: Payer: Self-pay | Admitting: Hematology

## 2019-05-11 ENCOUNTER — Other Ambulatory Visit: Payer: Medicare Other

## 2019-05-11 DIAGNOSIS — R079 Chest pain, unspecified: Secondary | ICD-10-CM | POA: Diagnosis not present

## 2019-05-11 DIAGNOSIS — R000 Tachycardia, unspecified: Secondary | ICD-10-CM | POA: Diagnosis not present

## 2019-05-11 DIAGNOSIS — Z515 Encounter for palliative care: Secondary | ICD-10-CM

## 2019-05-11 DIAGNOSIS — R0789 Other chest pain: Secondary | ICD-10-CM | POA: Diagnosis not present

## 2019-05-11 NOTE — Telephone Encounter (Signed)
Scheduled appt per 12/29 los.  Spoke with pt and she is aware of the appt date and time.

## 2019-05-11 NOTE — Progress Notes (Signed)
COMMUNITY PALLIATIVE CARE SW NOTE  PATIENT NAME: Stephanie Combs DOB: 04-18-1951 MRN: [id number]  PRIMARY CARE PROVIDER: [name], MD  RESPONSIBLE PARTY:  Acct ID - Guarantor Home Phone Work Phone Relationship Acct Type  [id number] Stephanie [phone]  Self P/F     [place], [place], [place] [address]     PLAN OF CARE and INTERVENTIONS:             1. GOALS OF CARE/ ADVANCE CARE PLANNING:  Patient is a DNR, form is in the home. HCPOA is Building services engineer. Goal is to remain at home.   2. SOCIAL/EMOTIONAL/SPIRITUAL ASSESSMENT/ INTERVENTIONS:  SW and RN met with patient and Stephanie Combs (patient's daughter) in the home. Patient left [facility] AMA last week. Patient said she just wanted to be home for Christmas, but also noted that she did not feel she was improving at the facility. Patient does not seem as motivated this visit. Patient said her pain in her back and her arms, is at level 7 out of 10. Patient said the medications help. Patient's appetite is better, eating two small meals a day. Patient is sleeping well, other than getting up to go to the bathroom. Patient is no longer receiving any treatment and is not receiving any rehab. Discussed hospice services. SW provided emotional support to patient, used active and reflective listening, used solution focused techniques discussion goals and assessed safety. Team encouraged Stephanie Combs and patient to consider options 24-hour care, and discussed the need for patient to not be alone.  3. PATIENT/CAREGIVER EDUCATION/ COPING:  Patient was alert, engaged in conversation. Patient is tangential at times but can be redirected. Patient expressed her goal clearly is to remain at home. Patient was tearful discussion decline and potential referral to hospice. Patient has limited family and friend support. Daughter is supportive of patient's goals but does work full-time.  4. PERSONAL EMERGENCY PLAN:  Patient/family will call 9-1-1 for emergencies. 5. COMMUNITY  RESOURCES COORDINATION/ HEALTH CARE NAVIGATION:  Patient has no upcoming appointments. Stephanie Combs with [facility] visited patient yesterday to discuss care resources. SW updated Stephanie Combs on hospice referral. Team encouraged Stephanie Combs to help with care coordination as patient seems overwhelmed.  6. FINANCIAL/LEGAL CONCERNS/INTERVENTIONS:  Patient has fixed income and Medicaid.      SOCIAL HX:  Social History   Tobacco Use  . Smoking status: Former Smoker    Packs/day: 1.50    Years: 45.00    Pack years: 67.50    Types: Cigarettes    Quit date: 11/24/2014    Years since quitting: 4.4  . Smokeless tobacco: Never Used  Substance Use Topics  . Alcohol use: No    Alcohol/week: 0.0 standard drinks    CODE STATUS:   Code Status: Prior (DNR) ADVANCED DIRECTIVES: Y MOST FORM COMPLETE:  No. HOSPICE EDUCATION PROVIDED: Yes.  PPS: Patient is dependent of ADLs, but can feed herself. Patient is doing transfers only and was advised to have assistance. Patient is high fall risk.   Patient has had significant decline, patient is being referred to hospice services. RN to receive order from Dr. Burr Medico and medical director.   I spent 60 minutes with patient/family, from 10:00-11:00a providing education, support and consultation.   Stephanie Lombard, LCSW

## 2019-05-11 NOTE — Progress Notes (Signed)
PATIENT NAME: Stephanie Combs DOB: January 19, 1951 MRN: [id number]  PRIMARY CARE PROVIDER: [name], MD  RESPONSIBLE PARTY:  Acct ID - Guarantor Home Phone Work Phone Relationship Acct Type  [id number] GENESIS, [id number]  Self P/F     [place], [place], [place] [address]    PLAN OF CARE and INTERVENTIONS:               1.  GOALS OF CARE/ ADVANCE CARE PLANNING:  Patient wants to die at home. Patient is wanting to remain in her home.               2.  PATIENT/CAREGIVER EDUCATION:  Education on fall precautions, education on hospice care, reviewed meds, support                3.  DISEASE STATUS: SW and RN made joint home visit. Patient sitting in her recliner in living room, patients daughter [name] in home with patient. [name] picked patient up from rehab center on Wednesday. Patient left facility AMA. Patient has had to call EMS out to home 3 times since returning home, once for a fall and other times to assist her with getting to the bedside commode. Patient has been sleeping in recliner chair. Patients daughter continues to work and is not in the home around the clock. Home Health agencies have declined patients home health referrals. Patient's oncologist Dr Burr Medico has made recommendation for hospice.  Palliative team talks with patient and daughter about hospice services and patient is in agreement with hospice evaluation. 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 Patient received 10 rounds of radiation and Dr Lewayne Bunting  (nurse [name]) reports patient will no longer be receiving chemotherapy. Patient can ambulate at times once she gets on her feet however patient is shaky and weak. Patient reports she is eating better and daughter agrees with this. Patient reports having pain (generalized), worse in her back and has been taking oxycodone Q4 hours. Patient remains on O2 at 2 LPM. Nurse contacted medical director doctor Clifton James and  Doctor Clifton James in agreement with hospice referral. [name] and patient informed that referral intake would be calling to schedule time for admission nurse to make visit.  Nurse reviewed medications with patient and daughter. Patient and daughter encouraged to contact palliative care team with questions or concerns.     HISTORY OF PRESENT ILLNESS:  Patient is a 68 year old female.  Patient left [facility] facility on 05/04/19 AMA.  Patient agreeable to hospice services.  Patient will be referred to hospice.      CODE STATUS: DNR  ADVANCED DIRECTIVES: Y MOST FORM: No PPS: 30%   PHYSICAL EXAM:   VITALS: Today's Vitals   05/11/19 1034  BP: (!) 138/56  Pulse: 95  Resp: 18  Temp: 99.3 F (37.4 C)  TempSrc: Temporal  SpO2: 98%  Weight: 288 lb (130.6 kg)  Height: 5\' 5"  (1.651 m)  PainSc: 7   PainLoc: Generalized    LUNGS: decreased breath sounds CARDIAC: Cor irreg, irreg RRR  EXTREMITIES: 4+ and pitting edema SKIN: daughter reports patients   NEURO: positive for gait problems, memory problems and weakness       Nilda Simmer, RN

## 2019-05-12 ENCOUNTER — Inpatient Hospital Stay: Payer: Medicare Other | Admitting: Hematology

## 2019-05-15 DIAGNOSIS — R5381 Other malaise: Secondary | ICD-10-CM | POA: Diagnosis not present

## 2019-05-16 ENCOUNTER — Telehealth: Payer: Self-pay

## 2019-05-16 DIAGNOSIS — J449 Chronic obstructive pulmonary disease, unspecified: Secondary | ICD-10-CM | POA: Diagnosis not present

## 2019-05-16 NOTE — Telephone Encounter (Signed)
Faxed signed orders for Hospice to [facility] at [phone], sent to HIM for scan to chart.

## 2019-05-17 ENCOUNTER — Encounter (HOSPITAL_COMMUNITY): Payer: Self-pay | Admitting: General Practice

## 2019-05-17 DIAGNOSIS — R5381 Other malaise: Secondary | ICD-10-CM | POA: Diagnosis not present

## 2019-05-17 NOTE — Progress Notes (Signed)
[facility] CSW Progress Notes  Enrolled in [employer].  Will receive 7 meals/week delivered by [employer] for next 6 months.  Edwyna Shell, LCSW Clinical Social Worker Phone:  [phone] Cell:  [phone]

## 2019-05-18 ENCOUNTER — Other Ambulatory Visit: Payer: Self-pay

## 2019-05-18 ENCOUNTER — Encounter (HOSPITAL_COMMUNITY): Payer: Self-pay

## 2019-05-18 DIAGNOSIS — C3412 Malignant neoplasm of upper lobe, left bronchus or lung: Secondary | ICD-10-CM | POA: Diagnosis not present

## 2019-05-18 DIAGNOSIS — K5900 Constipation, unspecified: Secondary | ICD-10-CM | POA: Diagnosis not present

## 2019-05-18 DIAGNOSIS — I2510 Atherosclerotic heart disease of native coronary artery without angina pectoris: Secondary | ICD-10-CM | POA: Diagnosis present

## 2019-05-18 DIAGNOSIS — C3492 Malignant neoplasm of unspecified part of left bronchus or lung: Secondary | ICD-10-CM | POA: Diagnosis not present

## 2019-05-18 DIAGNOSIS — N183 Chronic kidney disease, stage 3 unspecified: Secondary | ICD-10-CM | POA: Diagnosis present

## 2019-05-18 DIAGNOSIS — R5381 Other malaise: Secondary | ICD-10-CM | POA: Diagnosis not present

## 2019-05-18 DIAGNOSIS — R531 Weakness: Secondary | ICD-10-CM | POA: Diagnosis not present

## 2019-05-18 DIAGNOSIS — M2550 Pain in unspecified joint: Secondary | ICD-10-CM | POA: Diagnosis not present

## 2019-05-18 DIAGNOSIS — N189 Chronic kidney disease, unspecified: Secondary | ICD-10-CM | POA: Diagnosis not present

## 2019-05-18 DIAGNOSIS — J181 Lobar pneumonia, unspecified organism: Secondary | ICD-10-CM | POA: Diagnosis not present

## 2019-05-18 DIAGNOSIS — Z20822 Contact with and (suspected) exposure to covid-19: Secondary | ICD-10-CM | POA: Diagnosis not present

## 2019-05-18 DIAGNOSIS — I130 Hypertensive heart and chronic kidney disease with heart failure and stage 1 through stage 4 chronic kidney disease, or unspecified chronic kidney disease: Secondary | ICD-10-CM | POA: Diagnosis not present

## 2019-05-18 DIAGNOSIS — Z86010 Personal history of colonic polyps: Secondary | ICD-10-CM

## 2019-05-18 DIAGNOSIS — M549 Dorsalgia, unspecified: Secondary | ICD-10-CM | POA: Diagnosis present

## 2019-05-18 DIAGNOSIS — E039 Hypothyroidism, unspecified: Secondary | ICD-10-CM | POA: Diagnosis not present

## 2019-05-18 DIAGNOSIS — G8929 Other chronic pain: Secondary | ICD-10-CM | POA: Diagnosis present

## 2019-05-18 DIAGNOSIS — I959 Hypotension, unspecified: Secondary | ICD-10-CM | POA: Diagnosis not present

## 2019-05-18 DIAGNOSIS — Z885 Allergy status to narcotic agent status: Secondary | ICD-10-CM

## 2019-05-18 DIAGNOSIS — Z515 Encounter for palliative care: Secondary | ICD-10-CM | POA: Diagnosis present

## 2019-05-18 DIAGNOSIS — R627 Adult failure to thrive: Secondary | ICD-10-CM | POA: Diagnosis not present

## 2019-05-18 DIAGNOSIS — C7931 Secondary malignant neoplasm of brain: Secondary | ICD-10-CM | POA: Diagnosis present

## 2019-05-18 DIAGNOSIS — J189 Pneumonia, unspecified organism: Secondary | ICD-10-CM | POA: Diagnosis not present

## 2019-05-18 DIAGNOSIS — Z66 Do not resuscitate: Secondary | ICD-10-CM | POA: Diagnosis present

## 2019-05-18 DIAGNOSIS — D649 Anemia, unspecified: Secondary | ICD-10-CM | POA: Diagnosis not present

## 2019-05-18 DIAGNOSIS — C189 Malignant neoplasm of colon, unspecified: Secondary | ICD-10-CM | POA: Diagnosis not present

## 2019-05-18 DIAGNOSIS — R509 Fever, unspecified: Secondary | ICD-10-CM | POA: Diagnosis not present

## 2019-05-18 DIAGNOSIS — D631 Anemia in chronic kidney disease: Secondary | ICD-10-CM | POA: Diagnosis not present

## 2019-05-18 DIAGNOSIS — J440 Chronic obstructive pulmonary disease with acute lower respiratory infection: Secondary | ICD-10-CM | POA: Diagnosis present

## 2019-05-18 DIAGNOSIS — C183 Malignant neoplasm of hepatic flexure: Secondary | ICD-10-CM | POA: Diagnosis not present

## 2019-05-18 DIAGNOSIS — Z6841 Body Mass Index (BMI) 40.0 and over, adult: Secondary | ICD-10-CM

## 2019-05-18 DIAGNOSIS — Z9114 Patient's other noncompliance with medication regimen: Secondary | ICD-10-CM

## 2019-05-18 DIAGNOSIS — E119 Type 2 diabetes mellitus without complications: Secondary | ICD-10-CM | POA: Diagnosis not present

## 2019-05-18 DIAGNOSIS — Z9981 Dependence on supplemental oxygen: Secondary | ICD-10-CM

## 2019-05-18 DIAGNOSIS — C19 Malignant neoplasm of rectosigmoid junction: Secondary | ICD-10-CM | POA: Diagnosis present

## 2019-05-18 DIAGNOSIS — M069 Rheumatoid arthritis, unspecified: Secondary | ICD-10-CM | POA: Diagnosis present

## 2019-05-18 DIAGNOSIS — Z87891 Personal history of nicotine dependence: Secondary | ICD-10-CM

## 2019-05-18 DIAGNOSIS — N179 Acute kidney failure, unspecified: Secondary | ICD-10-CM | POA: Diagnosis not present

## 2019-05-18 DIAGNOSIS — Z7401 Bed confinement status: Secondary | ICD-10-CM | POA: Diagnosis not present

## 2019-05-18 DIAGNOSIS — K219 Gastro-esophageal reflux disease without esophagitis: Secondary | ICD-10-CM | POA: Diagnosis not present

## 2019-05-18 DIAGNOSIS — Z79890 Hormone replacement therapy (postmenopausal): Secondary | ICD-10-CM

## 2019-05-18 DIAGNOSIS — D500 Iron deficiency anemia secondary to blood loss (chronic): Secondary | ICD-10-CM | POA: Diagnosis not present

## 2019-05-18 DIAGNOSIS — Z743 Need for continuous supervision: Secondary | ICD-10-CM | POA: Diagnosis not present

## 2019-05-18 DIAGNOSIS — E1122 Type 2 diabetes mellitus with diabetic chronic kidney disease: Secondary | ICD-10-CM | POA: Diagnosis present

## 2019-05-18 DIAGNOSIS — Z923 Personal history of irradiation: Secondary | ICD-10-CM

## 2019-05-18 DIAGNOSIS — R2689 Other abnormalities of gait and mobility: Secondary | ICD-10-CM | POA: Diagnosis not present

## 2019-05-18 DIAGNOSIS — M6281 Muscle weakness (generalized): Secondary | ICD-10-CM | POA: Diagnosis not present

## 2019-05-18 DIAGNOSIS — Z794 Long term (current) use of insulin: Secondary | ICD-10-CM

## 2019-05-18 DIAGNOSIS — Z888 Allergy status to other drugs, medicaments and biological substances status: Secondary | ICD-10-CM

## 2019-05-18 DIAGNOSIS — Z955 Presence of coronary angioplasty implant and graft: Secondary | ICD-10-CM

## 2019-05-18 DIAGNOSIS — F418 Other specified anxiety disorders: Secondary | ICD-10-CM | POA: Diagnosis not present

## 2019-05-18 DIAGNOSIS — Z7982 Long term (current) use of aspirin: Secondary | ICD-10-CM

## 2019-05-18 DIAGNOSIS — L89629 Pressure ulcer of left heel, unspecified stage: Secondary | ICD-10-CM | POA: Diagnosis not present

## 2019-05-18 DIAGNOSIS — R600 Localized edema: Secondary | ICD-10-CM | POA: Diagnosis not present

## 2019-05-18 DIAGNOSIS — I5032 Chronic diastolic (congestive) heart failure: Secondary | ICD-10-CM | POA: Diagnosis not present

## 2019-05-18 DIAGNOSIS — Z880 Allergy status to penicillin: Secondary | ICD-10-CM

## 2019-05-18 DIAGNOSIS — Z79899 Other long term (current) drug therapy: Secondary | ICD-10-CM

## 2019-05-18 NOTE — ED Notes (Signed)
Pt yelling out that she needs help in the lobby. Pt demanding to be seen at this time due to being a cancer patient. Pt encouraged to wait until a room is ready for her, and to not leave. Pt given warm blankets while waiting in the lobby, and updated on how long she has been waiting for and what she is waiting for. Pt notified a urine specimen is needed.

## 2019-05-18 NOTE — ED Triage Notes (Signed)
Per [facility]: Pt coming in c/o weakness and failure to thrive. Hospice care. Requested to be seen here.   127/50 73  99 4 L (baseline) 18 RR

## 2019-05-18 NOTE — ED Notes (Signed)
Pt told staff in lobby that she left her bag of phones and bag of phone cords in radiology. Pt reminded that she has not been to radiology while being seen in the ED this visit. Pt shown her bag of phones and phone cords in her personal bag with her, and assisted with using her phone.

## 2019-05-18 NOTE — ED Notes (Signed)
Pt provided with new oxygen tank while waiting in the lobby. Pt updated that she is still waiting to be seen and treated but we currently have no beds.

## 2019-05-19 ENCOUNTER — Inpatient Hospital Stay (HOSPITAL_COMMUNITY)
Admission: EM | Admit: 2019-05-19 | Discharge: 2019-05-24 | DRG: 291 | Disposition: A | Discharge status: Skilled Nursing Facility | Attending: Hematology | Admitting: Hematology

## 2019-05-19 ENCOUNTER — Inpatient Hospital Stay (HOSPITAL_COMMUNITY)

## 2019-05-19 ENCOUNTER — Encounter (HOSPITAL_COMMUNITY): Payer: Self-pay | Admitting: Hematology

## 2019-05-19 DIAGNOSIS — C19 Malignant neoplasm of rectosigmoid junction: Secondary | ICD-10-CM | POA: Diagnosis present

## 2019-05-19 DIAGNOSIS — J189 Pneumonia, unspecified organism: Secondary | ICD-10-CM | POA: Diagnosis present

## 2019-05-19 DIAGNOSIS — I5032 Chronic diastolic (congestive) heart failure: Secondary | ICD-10-CM

## 2019-05-19 DIAGNOSIS — C183 Malignant neoplasm of hepatic flexure: Secondary | ICD-10-CM | POA: Diagnosis present

## 2019-05-19 DIAGNOSIS — J440 Chronic obstructive pulmonary disease with acute lower respiratory infection: Secondary | ICD-10-CM | POA: Diagnosis present

## 2019-05-19 DIAGNOSIS — C7931 Secondary malignant neoplasm of brain: Secondary | ICD-10-CM | POA: Diagnosis present

## 2019-05-19 DIAGNOSIS — D500 Iron deficiency anemia secondary to blood loss (chronic): Secondary | ICD-10-CM

## 2019-05-19 DIAGNOSIS — I130 Hypertensive heart and chronic kidney disease with heart failure and stage 1 through stage 4 chronic kidney disease, or unspecified chronic kidney disease: Secondary | ICD-10-CM | POA: Diagnosis present

## 2019-05-19 DIAGNOSIS — C3412 Malignant neoplasm of upper lobe, left bronchus or lung: Secondary | ICD-10-CM | POA: Diagnosis present

## 2019-05-19 DIAGNOSIS — R600 Localized edema: Secondary | ICD-10-CM | POA: Diagnosis not present

## 2019-05-19 DIAGNOSIS — E039 Hypothyroidism, unspecified: Secondary | ICD-10-CM | POA: Diagnosis present

## 2019-05-19 DIAGNOSIS — Z66 Do not resuscitate: Secondary | ICD-10-CM | POA: Diagnosis present

## 2019-05-19 DIAGNOSIS — IMO0002 Reserved for concepts with insufficient information to code with codable children: Secondary | ICD-10-CM

## 2019-05-19 DIAGNOSIS — R509 Fever, unspecified: Secondary | ICD-10-CM

## 2019-05-19 DIAGNOSIS — E1122 Type 2 diabetes mellitus with diabetic chronic kidney disease: Secondary | ICD-10-CM

## 2019-05-19 DIAGNOSIS — E8779 Other fluid overload: Secondary | ICD-10-CM

## 2019-05-19 DIAGNOSIS — J449 Chronic obstructive pulmonary disease, unspecified: Secondary | ICD-10-CM

## 2019-05-19 DIAGNOSIS — G8929 Other chronic pain: Secondary | ICD-10-CM | POA: Diagnosis present

## 2019-05-19 DIAGNOSIS — L89629 Pressure ulcer of left heel, unspecified stage: Secondary | ICD-10-CM | POA: Diagnosis present

## 2019-05-19 DIAGNOSIS — D649 Anemia, unspecified: Secondary | ICD-10-CM | POA: Diagnosis present

## 2019-05-19 DIAGNOSIS — F418 Other specified anxiety disorders: Secondary | ICD-10-CM | POA: Diagnosis not present

## 2019-05-19 DIAGNOSIS — Z20822 Contact with and (suspected) exposure to covid-19: Secondary | ICD-10-CM | POA: Diagnosis present

## 2019-05-19 DIAGNOSIS — N183 Chronic kidney disease, stage 3 unspecified: Secondary | ICD-10-CM

## 2019-05-19 DIAGNOSIS — Z6841 Body Mass Index (BMI) 40.0 and over, adult: Secondary | ICD-10-CM | POA: Diagnosis not present

## 2019-05-19 DIAGNOSIS — N179 Acute kidney failure, unspecified: Secondary | ICD-10-CM | POA: Diagnosis present

## 2019-05-19 DIAGNOSIS — M069 Rheumatoid arthritis, unspecified: Secondary | ICD-10-CM | POA: Diagnosis present

## 2019-05-19 DIAGNOSIS — K5900 Constipation, unspecified: Secondary | ICD-10-CM | POA: Diagnosis present

## 2019-05-19 DIAGNOSIS — N189 Chronic kidney disease, unspecified: Secondary | ICD-10-CM | POA: Diagnosis not present

## 2019-05-19 DIAGNOSIS — R627 Adult failure to thrive: Secondary | ICD-10-CM | POA: Diagnosis present

## 2019-05-19 DIAGNOSIS — Z515 Encounter for palliative care: Secondary | ICD-10-CM | POA: Diagnosis present

## 2019-05-19 DIAGNOSIS — D631 Anemia in chronic kidney disease: Secondary | ICD-10-CM

## 2019-05-19 DIAGNOSIS — I2510 Atherosclerotic heart disease of native coronary artery without angina pectoris: Secondary | ICD-10-CM | POA: Diagnosis present

## 2019-05-19 DIAGNOSIS — M549 Dorsalgia, unspecified: Secondary | ICD-10-CM | POA: Diagnosis present

## 2019-05-19 DIAGNOSIS — K5903 Drug induced constipation: Secondary | ICD-10-CM

## 2019-05-19 LAB — COMPREHENSIVE METABOLIC PANEL
ALT: 22 U/L (ref 0–44)
AST: 32 U/L (ref 15–41)
Albumin: 3.3 g/dL — ABNORMAL LOW (ref 3.5–5.0)
Alkaline Phosphatase: 82 U/L (ref 38–126)
Anion gap: 12 (ref 5–15)
BUN: 31 mg/dL — ABNORMAL HIGH (ref 8–23)
CO2: 24 mmol/L (ref 22–32)
Calcium: 8.7 mg/dL — ABNORMAL LOW (ref 8.9–10.3)
Chloride: 103 mmol/L (ref 98–111)
Creatinine, Ser: 1.55 mg/dL — ABNORMAL HIGH (ref 0.44–1.00)
GFR calc Af Amer: 39 mL/min — ABNORMAL LOW (ref >60)
GFR calc non Af Amer: 34 mL/min — ABNORMAL LOW (ref >60)
Glucose, Bld: 233 mg/dL — ABNORMAL HIGH (ref 70–99)
Potassium: 4.7 mmol/L (ref 3.5–5.1)
Sodium: 139 mmol/L (ref 135–145)
Total Bilirubin: 0.9 mg/dL (ref 0.3–1.2)
Total Protein: 6.6 g/dL (ref 6.5–8.1)

## 2019-05-19 LAB — CREATININE, SERUM
Creatinine, Ser: 1.45 mg/dL — ABNORMAL HIGH (ref 0.44–1.00)
GFR calc Af Amer: 42 mL/min — ABNORMAL LOW (ref >60)
GFR calc non Af Amer: 37 mL/min — ABNORMAL LOW (ref >60)

## 2019-05-19 LAB — URINALYSIS, ROUTINE W REFLEX MICROSCOPIC
Bilirubin Urine: NEGATIVE
Glucose, UA: NEGATIVE mg/dL
Ketones, ur: 5 mg/dL — AB
Leukocytes,Ua: NEGATIVE
Nitrite: NEGATIVE
Protein, ur: NEGATIVE mg/dL
Specific Gravity, Urine: 1.013 (ref 1.005–1.030)
pH: 5 (ref 5.0–8.0)

## 2019-05-19 LAB — CBC WITH DIFFERENTIAL/PLATELET
Abs Immature Granulocytes: 0.03 10*3/uL (ref 0.00–0.07)
Basophils Absolute: 0.1 10*3/uL (ref 0.0–0.1)
Basophils Relative: 1 %
Eosinophils Absolute: 0.1 10*3/uL (ref 0.0–0.5)
Eosinophils Relative: 2 %
HCT: 25.4 % — ABNORMAL LOW (ref 36.0–46.0)
Hemoglobin: 7.1 g/dL — ABNORMAL LOW (ref 12.0–15.0)
Immature Granulocytes: 1 %
Lymphocytes Relative: 12 %
Lymphs Abs: 0.6 10*3/uL — ABNORMAL LOW (ref 0.7–4.0)
MCH: 28.6 pg (ref 26.0–34.0)
MCHC: 28 g/dL — ABNORMAL LOW (ref 30.0–36.0)
MCV: 102.4 fL — ABNORMAL HIGH (ref 80.0–100.0)
Monocytes Absolute: 0.6 10*3/uL (ref 0.1–1.0)
Monocytes Relative: 10 %
Neutro Abs: 4.1 10*3/uL (ref 1.7–7.7)
Neutrophils Relative %: 74 %
Platelets: 289 10*3/uL (ref 150–400)
RBC: 2.48 MIL/uL — ABNORMAL LOW (ref 3.87–5.11)
RDW: 16.9 % — ABNORMAL HIGH (ref 11.5–15.5)
WBC: 5.4 10*3/uL (ref 4.0–10.5)
nRBC: 0 % (ref 0.0–0.2)

## 2019-05-19 LAB — BRAIN NATRIURETIC PEPTIDE: B Natriuretic Peptide: 316 pg/mL — ABNORMAL HIGH (ref 0.0–100.0)

## 2019-05-19 LAB — PROTIME-INR
INR: 1 (ref 0.8–1.2)
Prothrombin Time: 13.4 seconds (ref 11.4–15.2)

## 2019-05-19 LAB — CBC
HCT: 25.2 % — ABNORMAL LOW (ref 36.0–46.0)
Hemoglobin: 7.2 g/dL — ABNORMAL LOW (ref 12.0–15.0)
MCH: 28.5 pg (ref 26.0–34.0)
MCHC: 28.6 g/dL — ABNORMAL LOW (ref 30.0–36.0)
MCV: 99.6 fL (ref 80.0–100.0)
Platelets: 340 10*3/uL (ref 150–400)
RBC: 2.53 MIL/uL — ABNORMAL LOW (ref 3.87–5.11)
RDW: 17 % — ABNORMAL HIGH (ref 11.5–15.5)
WBC: 4.9 10*3/uL (ref 4.0–10.5)
nRBC: 0 % (ref 0.0–0.2)

## 2019-05-19 LAB — CBG MONITORING, ED
Glucose-Capillary: 193 mg/dL — ABNORMAL HIGH (ref 70–99)
Glucose-Capillary: 194 mg/dL — ABNORMAL HIGH (ref 70–99)

## 2019-05-19 LAB — APTT: aPTT: 28 seconds (ref 24–36)

## 2019-05-19 LAB — PREPARE RBC (CROSSMATCH)

## 2019-05-19 LAB — SARS CORONAVIRUS 2 (TAT 6-24 HRS): SARS Coronavirus 2: NEGATIVE

## 2019-05-19 MED ORDER — ALBUTEROL SULFATE (2.5 MG/3ML) 0.083% IN NEBU: 2.5000 mg | INHALATION_SOLUTION | Freq: Two times a day (BID) | RESPIRATORY_TRACT | Status: DC | PRN

## 2019-05-19 MED ORDER — ALLOPURINOL 300 MG PO TABS
300.0000 mg | ORAL_TABLET | Freq: Every day | ORAL | Status: DC
  Administered 2019-05-19 – 2019-05-24 (×6): 300 mg via ORAL
  Filled 2019-05-19 (×6): qty 1

## 2019-05-19 MED ORDER — FUROSEMIDE 40 MG PO TABS: 40.0000 mg | ORAL_TABLET | Freq: Every day | ORAL | Status: DC

## 2019-05-19 MED ORDER — ALBUTEROL SULFATE HFA 108 (90 BASE) MCG/ACT IN AERS
2.0000 | INHALATION_SPRAY | Freq: Once | RESPIRATORY_TRACT | Status: AC
  Administered 2019-05-19: 2 via RESPIRATORY_TRACT
  Filled 2019-05-19: qty 6.7

## 2019-05-19 MED ORDER — INSULIN ASPART 100 UNIT/ML ~~LOC~~ SOLN
0.0000 [IU] | Freq: Three times a day (TID) | SUBCUTANEOUS | Status: DC
  Administered 2019-05-19: 2 [IU] via SUBCUTANEOUS
  Administered 2019-05-20 (×2): 1 [IU] via SUBCUTANEOUS
  Administered 2019-05-21 – 2019-05-22 (×4): 2 [IU] via SUBCUTANEOUS
  Administered 2019-05-22: 3 [IU] via SUBCUTANEOUS
  Administered 2019-05-23 (×2): 1 [IU] via SUBCUTANEOUS
  Administered 2019-05-24 (×2): 2 [IU] via SUBCUTANEOUS
  Filled 2019-05-19: qty 0.09

## 2019-05-19 MED ORDER — ALPRAZOLAM 1 MG PO TABS
1.0000 mg | ORAL_TABLET | Freq: Three times a day (TID) | ORAL | Status: DC | PRN
  Administered 2019-05-19 – 2019-05-24 (×10): 1 mg via ORAL
  Filled 2019-05-19 (×7): qty 1
  Filled 2019-05-19: qty 2
  Filled 2019-05-19 (×2): qty 1

## 2019-05-19 MED ORDER — LEVOTHYROXINE SODIUM 100 MCG PO TABS
200.0000 ug | ORAL_TABLET | Freq: Every day | ORAL | Status: DC
  Administered 2019-05-20 – 2019-05-24 (×5): 200 ug via ORAL
  Filled 2019-05-19 (×5): qty 2

## 2019-05-19 MED ORDER — ASPIRIN EC 81 MG PO TBEC
81.0000 mg | DELAYED_RELEASE_TABLET | Freq: Every day | ORAL | Status: DC
  Administered 2019-05-19 – 2019-05-24 (×6): 81 mg via ORAL
  Filled 2019-05-19 (×6): qty 1

## 2019-05-19 MED ORDER — SODIUM CHLORIDE 0.9% IV SOLUTION: Freq: Once | INTRAVENOUS | Status: DC

## 2019-05-19 MED ORDER — GABAPENTIN 300 MG PO CAPS
600.0000 mg | ORAL_CAPSULE | Freq: Three times a day (TID) | ORAL | Status: DC
  Administered 2019-05-19 – 2019-05-24 (×17): 600 mg via ORAL
  Filled 2019-05-19 (×16): qty 2

## 2019-05-19 MED ORDER — FUROSEMIDE 10 MG/ML IJ SOLN
20.0000 mg | Freq: Every day | INTRAMUSCULAR | Status: DC
  Administered 2019-05-20 – 2019-05-24 (×5): 20 mg via INTRAVENOUS
  Filled 2019-05-19 (×5): qty 2

## 2019-05-19 MED ORDER — DIPHENOXYLATE-ATROPINE 2.5-0.025 MG PO TABS: 1.0000 | ORAL_TABLET | Freq: Four times a day (QID) | ORAL | Status: DC | PRN

## 2019-05-19 MED ORDER — SODIUM CHLORIDE 0.9 % IV SOLN
2.0000 g | INTRAVENOUS | Status: AC
  Administered 2019-05-19 – 2019-05-22 (×4): 2 g via INTRAVENOUS
  Filled 2019-05-19 (×4): qty 2

## 2019-05-19 MED ORDER — BUSPIRONE HCL 5 MG PO TABS
7.5000 mg | ORAL_TABLET | Freq: Two times a day (BID) | ORAL | Status: DC
  Administered 2019-05-19 – 2019-05-24 (×11): 7.5 mg via ORAL
  Filled 2019-05-19 (×11): qty 2

## 2019-05-19 MED ORDER — VITAMIN D 25 MCG (1000 UNIT) PO TABS
5000.0000 [IU] | ORAL_TABLET | Freq: Every day | ORAL | Status: DC
  Administered 2019-05-19 – 2019-05-24 (×6): 5000 [IU] via ORAL
  Filled 2019-05-19 (×6): qty 5

## 2019-05-19 MED ORDER — INSULIN DETEMIR 100 UNIT/ML ~~LOC~~ SOLN
34.0000 [IU] | Freq: Every day | SUBCUTANEOUS | Status: DC
  Administered 2019-05-19 – 2019-05-24 (×6): 34 [IU] via SUBCUTANEOUS
  Filled 2019-05-19 (×6): qty 0.34

## 2019-05-19 MED ORDER — OMEGA-3-ACID ETHYL ESTERS 1 G PO CAPS
1.0000 g | ORAL_CAPSULE | Freq: Every day | ORAL | Status: DC
  Administered 2019-05-19 – 2019-05-23 (×5): 1 g via ORAL
  Filled 2019-05-19 (×6): qty 1

## 2019-05-19 MED ORDER — UMECLIDINIUM-VILANTEROL 62.5-25 MCG/INH IN AEPB
1.0000 | INHALATION_SPRAY | Freq: Every day | RESPIRATORY_TRACT | Status: DC
  Administered 2019-05-20 – 2019-05-24 (×5): 1 via RESPIRATORY_TRACT
  Filled 2019-05-19: qty 14

## 2019-05-19 MED ORDER — NITROGLYCERIN 0.4 MG SL SUBL: 0.4000 mg | SUBLINGUAL_TABLET | SUBLINGUAL | Status: DC | PRN

## 2019-05-19 MED ORDER — FUROSEMIDE 10 MG/ML IJ SOLN
60.0000 mg | Freq: Once | INTRAMUSCULAR | Status: AC
  Administered 2019-05-19: 60 mg via INTRAVENOUS
  Filled 2019-05-19: qty 6

## 2019-05-19 MED ORDER — ONDANSETRON HCL 8 MG PO TABS
8.0000 mg | ORAL_TABLET | Freq: Three times a day (TID) | ORAL | Status: DC | PRN
  Administered 2019-05-22: 8 mg via ORAL
  Filled 2019-05-19: qty 1

## 2019-05-19 MED ORDER — POLYETHYLENE GLYCOL 3350 17 G PO PACK
17.0000 g | PACK | Freq: Two times a day (BID) | ORAL | Status: DC
  Administered 2019-05-20 – 2019-05-24 (×6): 17 g via ORAL
  Filled 2019-05-19 (×7): qty 1

## 2019-05-19 MED ORDER — PROMETHAZINE HCL 25 MG PO TABS: 12.5000 mg | ORAL_TABLET | Freq: Four times a day (QID) | ORAL | Status: DC | PRN

## 2019-05-19 MED ORDER — SUCRALFATE 1 G PO TABS
1.0000 g | ORAL_TABLET | Freq: Three times a day (TID) | ORAL | Status: DC
  Administered 2019-05-19 – 2019-05-24 (×21): 1 g via ORAL
  Filled 2019-05-19 (×21): qty 1

## 2019-05-19 MED ORDER — ENOXAPARIN SODIUM 60 MG/0.6ML ~~LOC~~ SOLN
60.0000 mg | SUBCUTANEOUS | Status: DC
  Administered 2019-05-19 – 2019-05-24 (×6): 60 mg via SUBCUTANEOUS
  Filled 2019-05-19 (×6): qty 0.6

## 2019-05-19 MED ORDER — SENNA 8.6 MG PO TABS
2.0000 | ORAL_TABLET | Freq: Two times a day (BID) | ORAL | Status: DC
  Administered 2019-05-19 – 2019-05-24 (×9): 17.2 mg via ORAL
  Filled 2019-05-19 (×9): qty 2

## 2019-05-19 MED ORDER — OXYCODONE HCL 5 MG PO TABS
5.0000 mg | ORAL_TABLET | Freq: Four times a day (QID) | ORAL | Status: DC | PRN
  Administered 2019-05-19 – 2019-05-24 (×9): 10 mg via ORAL
  Filled 2019-05-19 (×10): qty 2

## 2019-05-19 MED ORDER — SODIUM CHLORIDE 0.9 % IV SOLN
500.0000 mg | INTRAVENOUS | Status: DC
  Administered 2019-05-19: 500 mg via INTRAVENOUS
  Filled 2019-05-19 (×2): qty 500

## 2019-05-19 MED ORDER — PANTOPRAZOLE SODIUM 40 MG PO TBEC
40.0000 mg | DELAYED_RELEASE_TABLET | Freq: Two times a day (BID) | ORAL | Status: DC
  Administered 2019-05-19 – 2019-05-24 (×11): 40 mg via ORAL
  Filled 2019-05-19 (×11): qty 1

## 2019-05-19 MED ORDER — TIZANIDINE HCL 4 MG PO TABS
4.0000 mg | ORAL_TABLET | Freq: Three times a day (TID) | ORAL | Status: DC | PRN
  Administered 2019-05-20: 4 mg via ORAL
  Filled 2019-05-19: qty 1

## 2019-05-19 MED ORDER — ATORVASTATIN CALCIUM 40 MG PO TABS
80.0000 mg | ORAL_TABLET | Freq: Every day | ORAL | Status: DC
  Administered 2019-05-19 – 2019-05-24 (×6): 80 mg via ORAL
  Filled 2019-05-19 (×6): qty 2

## 2019-05-19 MED ORDER — TOPIRAMATE 25 MG PO TABS
75.0000 mg | ORAL_TABLET | Freq: Every day | ORAL | Status: DC
  Administered 2019-05-19 – 2019-05-24 (×6): 75 mg via ORAL
  Filled 2019-05-19 (×6): qty 3

## 2019-05-19 NOTE — Progress Notes (Signed)
[facility] Documentation  Pt is an active pt with [facility]. Liaison will follow while in ED and when/if pt is admitted.  ACC CM voiced concern over pt's ability to care for herself at home stating, "She cannot get herself up from a chair or walk. She has called EMS 6-8 times a day to things that you dont call 911 for like to get her some soup or to help her get changed from a soiled pull up".  Please contact [facility] with any questions.   Thank you,  Freddie Breech, RN [facility] Liaison [phone]

## 2019-05-19 NOTE — ED Provider Notes (Signed)
Care assumed at shift change pending admission consult.  See previous provider note for full HPI, work-up and MDM.  Briefly, patient with history of lung cancer on palliative care, followed by Dr. Burr Medico.  Presenting to the ED with worsening weakness, peripheral edema and symptomatic anemia.  Hemoglobin 7.1.  Patient ordered 1 unit pRBC and IV Lasix.  Patient requiring admission for symptomatic anemia.    Dr. Burr Medico with oncology accepting admission.   Karson Reede, [place] N, PA-C 05/19/19 0752    Shanon Rosser, MD 05/19/19 2236

## 2019-05-19 NOTE — Progress Notes (Signed)
05/19/19  1630  Received a call from Dr Burr Medico stating patient should be able to transfer to the 6th floor because they have beds available. Informed MD that I had already ask the 6th floor and AC around 2pm today and we were still waiting. Called 6th floor and spoke with [facility]. Per Rollene Fare they will have a bed available in a little while pending a discharge.

## 2019-05-19 NOTE — ED Notes (Signed)
Blood bank says they have blood ready for this patient, informed Dakina,RN.

## 2019-05-19 NOTE — Progress Notes (Signed)
05/19/2019  1128  Notified Dr Burr Medico of patient's temp 100.6 oral. MD ask to repeat axillary temp. Repeat temp 100.1 axillary. MD aware of patient's temp. Per MD okay to transfuse blood.

## 2019-05-19 NOTE — ED Provider Notes (Signed)
[facility] DEPT Provider Note   CSN: [id number] Arrival date & time: 05/18/19  2030     History Chief Complaint  Patient presents with  . Weakness    Stephanie Combs is a 69 y.o. female.  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  Last admission was in mid-December for similar complaints, found to be anemic (6.6) with AKI (Cr 2.39). She was discharged to [facility] by patient preference and states she signed herself out 4 days after admission because she was not happy with her care.   The history is provided by the patient. No language interpreter was used.  Weakness Associated symptoms: shortness of breath   Associated symptoms: no fever, no nausea and no vomiting        Past Medical History:  Diagnosis Date  . Anxiety   . CAD (coronary artery disease)    a. s/p DES to LAD and angioplasty to D1 in 05/2017  . Cancer ([place])   . CHF (congestive heart failure) (HCC)    Diastolic  . Chronic back pain   . Chronic pain   . COPD (chronic obstructive pulmonary disease) ([place])   . Degenerative disk disease   . Diabetes mellitus without complication ([place])   . History of medication noncompliance 11/2017   "the patient frequently self-adjusts medications without notifying her physicians"  . Hypertension   . Hypothyroidism   . On home O2    2.5 L N/C prn  . Pedal edema     Patient Active Problem List   Diagnosis Date Noted  . Goals of care, counseling/discussion   . Advanced care planning/counseling discussion   . Palliative care by specialist   . Do not resuscitate   . Anemia due to GI blood loss 04/25/2019  .  Pressure injury of skin 04/12/2019  . Fluid overload 04/10/2019  . Lung cancer ([place]) 04/10/2019  . Acute encephalopathy 04/02/2019  . Brain metastasis ([place]) 03/09/2019  . Acute on chronic diastolic heart failure ([place]) 10/11/2018  . Assistance needed with transportation 09/22/2018  . Primary cancer of hepatic flexure of colon ([place])   . Small cell lung cancer, left upper lobe ([place])   . Colonic mass 09/10/2018  . Colon polyps 09/10/2018  . Cellulitis of leg, left 09/10/2018  . Pain of lower extremity   . Non-intractable vomiting   . Abdominal pain, epigastric   . Heme positive stool   . Acute on chronic diastolic CHF (congestive heart failure) ([place]) 09/06/2018  . Symptomatic anemia 08/31/2018  . Anemia 08/30/2018  . Chronic pain 07/05/2018  . On home O2 07/05/2018  . Opioid dependence ([place]) 07/05/2018  . CAD (coronary artery disease) 05/14/2018  . AKI (acute kidney injury) ([place]) 04/05/2018  . Cellulitis 01/10/2018  . Cellulitis of right lower extremity 01/07/2018  . Altered mental status 01/06/2018  . Type 2 diabetes mellitus with hypoglycemia without coma ([place]) 11/24/2017  . Anxiety 11/24/2017  . Chronic respiratory failure with hypoxia ([place]) 11/24/2017  . Syncope 10/24/2017  . Bradycardia   . Syncope due to orthostatic hypotension 09/04/2017  . Obesity, Class III, BMI 40-49.9 (morbid obesity) ([place]) 09/04/2017  . Abnormal nuclear stress test 05/19/2017  . Atypical angina ([place])  05/19/2017  . Edema 05/15/2017  . Skin ulcer ([place]) 01/30/2017  . Tinea cruris 10/10/2016  . Vitamin D deficiency 08/19/2016  . Personal history of noncompliance with medical treatment, presenting hazards to health 06/17/2016  . CAP (community acquired pneumonia) 07/25/2015  . Pressure ulcer 05/02/2015  . Hyponatremia 05/01/2015  . Acute-on-chronic kidney injury ([place]) 05/01/2015  . Uncontrolled type 2 diabetes mellitus with stage 3 chronic kidney disease ([place]) 05/01/2015  . Cellulitis of foot, right  03/24/2015  . Peripheral edema 12/04/2014  . Acute renal failure superimposed on stage 3 chronic kidney disease ([place]) 11/24/2014  . Bilateral lower extremity edema   . Diabetic ulcer of right great toe ([place])   . Foot ulcer due to secondary DM ([place]) 11/07/2014  . Diabetic ulcer of right foot ([place]) 11/07/2014  . Edema of lower extremity 05/27/2013  . CKD (chronic kidney disease), stage III ([place]) 04/14/2013  . Anemia in CKD (chronic kidney disease) 04/14/2013  . Chest pain 04/14/2013  . Dyspnea 04/14/2013  . Chronic diastolic CHF (congestive heart failure) ([place])   . Hypothyroidism   . COPD (chronic obstructive pulmonary disease) ([place]) 08/07/2012  . Morbid obesity ([place]) 08/07/2012  . DM type 2 (diabetes mellitus, type 2) ([place]) 08/07/2012  . HTN (hypertension), benign 08/07/2012  . ARF (acute renal failure) ([place]) 08/07/2012    Past Surgical History:  Procedure Laterality Date  . BIOPSY  09/09/2018   Procedure: BIOPSY;  Surgeon: Daneil Dolin, MD;  Location: [facility];  Service: Endoscopy;;  gastric esophageal hepatic flexure colon  . COLONOSCOPY WITH PROPOFOL N/A 09/09/2018   hepatic flexure colon mass s/p biopsy, with polyps associated with tumor scattered all the way to the cecum not removed, multiple splenic flexure and rectal polyps removed, left-sided diverticulosis, ?right hemicolectomy. Hepatic flexure adenocarcinoma and additional adenocarcinoma in small splenic flexure polyp. Concern for synchronous colon cancer. High risk for surgical resection.   . CORONARY STENT INTERVENTION N/A 05/22/2017   Procedure: CORONARY STENT INTERVENTION;  Surgeon: Martinique, Peter M, MD;  Location: [facility];  Service: Cardiovascular;  Laterality: N/A;  . ESOPHAGOGASTRODUODENOSCOPY (EGD) WITH PROPOFOL N/A 09/09/2018   abnormal distal esophagus suspicious for Barrett's s/p biopsy, erosive gastropathy, duodenal bulbar erosions. +Barrett's  . HERNIA REPAIR    . POLYPECTOMY  09/09/2018    Procedure: POLYPECTOMY;  Surgeon: Daneil Dolin, MD;  Location: [facility];  Service: Endoscopy;;  colon  . RIGHT/LEFT HEART CATH AND CORONARY ANGIOGRAPHY N/A 05/19/2017   Procedure: RIGHT/LEFT HEART CATH AND CORONARY ANGIOGRAPHY;  Surgeon: Leonie Man, MD;  Location: [facility];  Service: Cardiovascular;  Laterality: N/A;  . TUBAL LIGATION    . VIDEO BRONCHOSCOPY WITH ENDOBRONCHIAL ULTRASOUND Left 09/14/2018   Procedure: VIDEO BRONCHOSCOPY WITH ENDOBRONCHIAL ULTRASOUND with biopsies;  Surgeon: Margaretha Seeds, MD;  Location: [facility];  Service: Thoracic;  Laterality: Left;     OB History   No obstetric history on file.     Family History  Problem Relation Age of Onset  . Stroke Mother        deceased 49   . Heart attack Father        deceased 30   . Diabetes Brother   . Cerebral palsy Brother   . Pneumonia Brother   . Diabetes Other   . Heart attack Other   . Colon cancer Neg Hx     Social History   Tobacco Use  . Smoking status: Former Smoker    Packs/day: 1.50  Years: 45.00    Pack years: 67.50    Types: Cigarettes    Quit date: 11/24/2014    Years since quitting: 4.4  . Smokeless tobacco: Never Used  Substance Use Topics  . Alcohol use: No    Alcohol/week: 0.0 standard drinks  . Drug use: No    Home Medications Prior to Admission medications   Medication Sig Start Date End Date Taking? Authorizing Provider  ondansetron (ZOFRAN) 8 MG tablet Take 1 tablet (8 mg total) by mouth every 8 (eight) hours as needed for nausea or vomiting. 04/28/19  Yes Emokpae, Courage, MD  albuterol (PROAIR HFA) 108 (90 Base) MCG/ACT inhaler Inhale 2 puffs into the lungs every 4 (four) hours as needed for wheezing or shortness of breath. For shortness of breath 09/22/18   Eugenie Filler, MD  albuterol (PROVENTIL) (2.5 MG/3ML) 0.083% nebulizer solution Take 2.5 mg by nebulization 2 (two) times daily as needed for wheezing or shortness of breath.     [provider]  allopurinol (ZYLOPRIM) 300 MG tablet Take 300 mg by mouth daily.    [provider]  ALPRAZolam Duanne Moron) 1 MG tablet Take 1 tablet (1 mg total) by mouth 3 (three) times daily as needed for anxiety or sleep. 05/10/19   Truitt Merle, MD  aspirin EC 81 MG tablet Take 81 mg by mouth daily.    [provider]  atorvastatin (LIPITOR) 80 MG tablet Take 1 tablet (80 mg total) by mouth daily at 6 PM. 05/26/17   [name], MD  busPIRone (BUSPAR) 7.5 MG tablet Take 7.5 mg by mouth 2 (two) times daily. 03/10/19   [provider]  cholecalciferol (VITAMIN D3) 25 MCG (1000 UT) tablet Take 5,000 Units by mouth daily.    [provider]  diphenhydrAMINE-zinc acetate (BENADRYL) cream Apply 1 application topically 3 (three) times daily as needed for itching. Patient taking differently: Apply 1 application topically 3 (three) times daily as needed for itching. Patient uses on her head for dry scalp. 03/09/19   Truitt Merle, MD  diphenoxylate-atropine (LOMOTIL) 2.5-0.025 MG tablet Take 1 tablet by mouth 4 (four) times daily as needed for diarrhea or loose stools.  01/03/19   [provider]  furosemide (LASIX) 40 MG tablet Take 1 tablet (40 mg total) by mouth daily. 04/28/19   Roxan Hockey, MD  gabapentin (NEURONTIN) 600 MG tablet Take 600 mg by mouth 3 (three) times daily.     [provider]  hydrocortisone (ANUSOL-HC) 2.5 % rectal cream Place 1 application rectally 4 (four) times daily as needed for hemorrhoids.  01/03/19   [provider]  Hydrocortisone (GERHARDT'S BUTT CREAM) CREA Apply 1 application topically 4 (four) times daily as needed for irritation. 12/16/18   Truitt Merle, MD  insulin aspart (NOVOLOG) 100 UNIT/ML injection Inject 0-9 Units into the skin See admin instructions. insulin aspart (novoLOG) injection 0-9 Units 0-9 Units,  Subcutaneous, 3 times daily with meals, CBG < 70: Implement Hypoglycemia Standing Orders and refer to Hypoglycemia  Standing Orders sidebar report  CBG 70 - 120: 0 units  CBG 121 - 150: 1 unit  CBG 151 - 200: 2 units  CBG 201 - 250: 3 units  CBG 251 - 300: 5 units  CBG 301 - 350: 7 units  CBG 351 - 400: 9 units CBG > 400: 04/28/19   Emokpae, Courage, MD  insulin detemir (LEVEMIR) 100 UNIT/ML injection Inject 0.34 mLs (34 Units total) into the skin at bedtime. 04/28/19  Roxan Hockey, MD  levothyroxine (SYNTHROID, LEVOTHROID) 200 MCG tablet Take 1 tablet by mouth daily before breakfast.  06/24/18   [provider]  nitroGLYCERIN (NITROSTAT) 0.4 MG SL tablet Place 0.4 mg under the tongue every 5 (five) minutes as needed for chest pain.  03/26/18   [provider]  nystatin (MYCOSTATIN/NYSTOP) powder Apply topically 2 (two) times daily. 02/22/19   Alla Feeling, NP  Omega-3 Fatty Acids (FISH OIL PO) Take 1 capsule by mouth daily.     [provider]  oxyCODONE (OXY IR/ROXICODONE) 5 MG immediate release tablet Take 1-2 tablets (5-10 mg total) by mouth every 6 (six) hours as needed for severe pain. 05/10/19   Truitt Merle, MD  oxyCODONE-acetaminophen (PERCOCET/ROXICET) 5-325 MG tablet Take 1 tablet by mouth every 6 (six) hours as needed for moderate pain or severe pain. 04/28/19   Roxan Hockey, MD  OXYGEN Inhale 2 L into the lungs continuous.    [provider]  pantoprazole (PROTONIX) 40 MG tablet Take 1 tablet (40 mg total) by mouth 2 (two) times daily. 10/07/18   Truitt Merle, MD  polyethylene glycol (MIRALAX / GLYCOLAX) 17 g packet Take 17 g by mouth 2 (two) times daily. 04/14/19   Shelly Coss, MD  potassium chloride SA (KLOR-CON) 20 MEQ tablet Take 1 tablet (20 mEq total) by mouth daily. 04/28/19   Roxan Hockey, MD  promethazine (PHENERGAN) 12.5 MG tablet Take 1 tablet (12.5 mg total) by mouth every 6 (six) hours as needed for nausea or vomiting. 02/22/19   Alla Feeling, NP  senna (SENOKOT) 8.6 MG TABS tablet Take 2 tablets (17.2 mg total) by mouth 2 (two) times  daily. 04/28/19   Roxan Hockey, MD  silver sulfADIAZINE (SILVADENE) 1 % cream Apply 1 application topically 2 (two) times daily.  01/03/19   [provider]  sucralfate (CARAFATE) 1 g tablet Take 1 tablet (1 g total) by mouth 4 (four) times daily -  with meals and at bedtime. 5 min before meals for radiation induced esophagitis 12/03/18   Tyler Pita, MD  SURE COMFORT INS SYR 1CC/28G 28G X 1/2" 1 ML MISC USE TO INJECT INSULIN UP TO 4 TIMES DAILY. 03/06/17   Cassandria Anger, MD  tiZANidine (ZANAFLEX) 4 MG tablet Take 1 tablet (4 mg total) by mouth every 8 (eight) hours as needed for muscle spasms. 05/10/19   Truitt Merle, MD  topiramate (TOPAMAX) 25 MG tablet Take 75 mg by mouth at bedtime.  03/18/18   [provider]  umeclidinium-vilanterol (ANORO ELLIPTA) 62.5-25 MCG/INH AEPB Inhale 1 puff into the lungs daily. 05/26/17   [name], MD    Allergies    Dilaudid [hydromorphone hcl], Midodrine hcl, Actifed cold-allergy [chlorpheniramine-phenylephrine], Doxycycline, Other, Penicillins, Reglan [metoclopramide], Valium [diazepam], and Vistaril [hydroxyzine hcl]  Review of Systems   Review of Systems  Constitutional: Negative for chills and fever.  HENT: Negative.   Respiratory: Positive for shortness of breath.   Cardiovascular: Positive for leg swelling.  Gastrointestinal: Negative.  Negative for nausea and vomiting.  Genitourinary: Negative.   Musculoskeletal: Negative.   Skin: Negative.   Neurological: Positive for weakness.    Physical Exam Updated Vital Signs BP 139/61   Pulse 81   Temp 99 F (37.2 C) (Oral)   Resp 16   Ht 5\' 6"  (1.676 m)   Wt 130.6 kg   SpO2 100%   BMI 46.48 kg/m   Physical Exam Vitals and nursing note reviewed.  Constitutional:  Appearance: She is obese.  HENT:     Head: Atraumatic.     Mouth/Throat:     Mouth: Mucous membranes are moist.  Cardiovascular:     Rate and Rhythm: Normal rate and regular rhythm.     Heart  sounds: No murmur.  Pulmonary:     Effort: Pulmonary effort is normal.     Breath sounds: No wheezing, rhonchi or rales.  Chest:     Chest wall: No tenderness.  Abdominal:     Tenderness: There is abdominal tenderness (Generalized, diffuse tenderness).  Musculoskeletal:        General: Normal range of motion.     Cervical back: Normal range of motion and neck supple.     Right lower leg: Edema present.     Left lower leg: Edema present.     Comments: Significant bilateral LE edema without evidence cellulitis  Skin:    General: Skin is warm and dry.  Neurological:     General: No focal deficit present.     Mental Status: She is alert and oriented to person, place, and time.     ED Results / Procedures / Treatments   Labs (all labs ordered are listed, but only abnormal results are displayed) Labs Reviewed  CBC WITH DIFFERENTIAL/PLATELET - Abnormal; Notable for the following components:      Result Value   RBC 2.48 (*)    Hemoglobin 7.1 (*)    HCT 25.4 (*)    MCV 102.4 (*)    MCHC 28.0 (*)    RDW 16.9 (*)    Lymphs Abs 0.6 (*)    All other components within normal limits  COMPREHENSIVE METABOLIC PANEL - Abnormal; Notable for the following components:   Glucose, Bld 233 (*)    BUN 31 (*)    Creatinine, Ser 1.55 (*)    Calcium 8.7 (*)    Albumin 3.3 (*)    GFR calc non Af Amer 34 (*)    GFR calc Af Amer 39 (*)    All other components within normal limits  URINALYSIS, ROUTINE W REFLEX MICROSCOPIC  PROTIME-INR  APTT  BRAIN NATRIURETIC PEPTIDE  POC OCCULT BLOOD, ED  TYPE AND SCREEN  PREPARE RBC (CROSSMATCH)    EKG None  Radiology No results found.  Procedures Procedures (including critical care time)  Medications Ordered in ED Medications  0.9 %  sodium chloride infusion (Manually program via Guardrails IV Fluids) (has no administration in time range)  furosemide (LASIX) injection 60 mg (has no administration in time range)  albuterol (VENTOLIN HFA) 108 (90  Base) MCG/ACT inhaler 2 puff (has no administration in time range)    ED Course  I have reviewed the triage vital signs and the nursing notes.  Pertinent labs & imaging results that were available during my care of the patient were reviewed by me and considered in my medical decision making (see chart for details).    MDM Rules/Calculators/A&P                      Patient to ED with c/o generalized weakness, unable to care for her self at home. She is a vague historian and details of her concerns are hard to define.  Ultimately, she feels she could become stronger if she were placed in a rehab facility for 4-6 weeks.   Patient seen previously for similar complaints. She has been started on palliative care. Chart reviewed. She is followed by Dr. Burr Medico at the  [facility]. Per her note (12/16) Hospice has been discussed with the patient and patient "is not ready". She notes at that time she recommended transfusion to keep her hgb above 8.0.   Hgb today is 7.1. She is hemodynamically stable but reports weakness and feeling like she needs to be transfused. When asked about seeing any melena or rectal bleeding, the patient cannot say. Currently in the hallway. Hemoccult card will need to be collected. Type and cross ordered anticipating transfusion per oncology recommendation on previous charting. She is s/p chemoradiation, not currently on any treatments. IV lasix given here secondary to significant lower extremity edema.   Labs are pending. Expectation is that she is admitted, hemoglobin stabilized, and again d/ch to rehab facility to attempt strengthening prior to discharge home.   Patient care signed out to Martin[place] Robinson, PA-C, pending lab results, and disposition/admission.   Final Clinical Impression(s) / ED Diagnoses Final diagnoses:  None   1. Generalized weakness 2. Anemia 3. Peripheral edema  Rx / DC Orders ED Discharge Orders    None       Charlann Lange, PA-C 05/19/19  [id number]    Molpus, Jenny Reichmann, MD 05/19/19 [phone]

## 2019-05-19 NOTE — H&P (Addendum)
[place]  [phone] [phone]   MEDICAL ONCOLOGY  Admission history and physical  Reason for admission: Anemia and weakness  HPI: [name] is a 69 year old female who is well-known to our practice and has a diagnosis of small cell lung cancer with brain metastases and colon cancer.  The patient has not received any recent chemotherapy and is on observation.  He presented to the emergency room with increased weakness, fatigue, and anorexia.  She was noted to have a temperature of 100.2 on admission.  CBC showed a hemoglobin of 7.1, glucose 233, BUN 31, creatinine 1.55, calcium 8.7, albumin 3.3.  COVID-19 testing is currently pending.  She reports shortness of breath but is not having any cough.  She has not noticed any recent bleeding.  She reports diffuse pain as well as nausea.  She is not currently having any vomiting.  She is not reporting any headaches or vision changes.  Denies chest discomfort.  No constipation or diarrhea reported.  She also reports ongoing lower extremity edema which has not really responded to Lasix. The patient is being admitted for treatment of her anemia and failure to thrive.   Past Medical History:  Diagnosis Date  . Anxiety   . CAD (coronary artery disease)    a. s/p DES to LAD and angioplasty to D1 in 05/2017  . Cancer ([place])   . CHF (congestive heart failure) (HCC)    Diastolic  . Chronic back pain   . Chronic pain   . COPD (chronic obstructive pulmonary disease) ([place])   . Degenerative disk disease   . Diabetes mellitus without complication ([place])   . History of medication noncompliance 11/2017   "the patient frequently self-adjusts medications without notifying her physicians"  . Hypertension   . Hypothyroidism   . On home O2    2.5 L N/C prn  . Pedal edema   :  Past Surgical History:  Procedure Laterality Date  . BIOPSY  09/09/2018   Procedure: BIOPSY;  Surgeon: Daneil Dolin, MD;  [facility];  Service: Endoscopy;;  gastric esophageal hepatic flexure colon  . COLONOSCOPY WITH PROPOFOL N/A 09/09/2018   hepatic flexure colon mass s/p biopsy, with polyps associated with tumor scattered all the way to the cecum not removed, multiple splenic flexure and rectal polyps removed, left-sided diverticulosis, ?right hemicolectomy. Hepatic flexure adenocarcinoma and additional adenocarcinoma in small splenic flexure polyp. Concern for synchronous colon cancer. High risk for surgical resection.   . CORONARY STENT INTERVENTION N/A 05/22/2017   Procedure: CORONARY STENT INTERVENTION;  Surgeon: Martinique, Peter M, MD;  Location: [facility];  Service: Cardiovascular;  Laterality: N/A;  . ESOPHAGOGASTRODUODENOSCOPY (EGD) WITH PROPOFOL N/A 09/09/2018   abnormal distal esophagus suspicious for Barrett's s/p biopsy, erosive gastropathy, duodenal bulbar erosions. +Barrett's  . HERNIA REPAIR    . POLYPECTOMY  09/09/2018   Procedure: POLYPECTOMY;  Surgeon: Daneil Dolin, MD;  [facility];  Service: Endoscopy;;  colon  . RIGHT/LEFT HEART CATH AND CORONARY ANGIOGRAPHY N/A 05/19/2017   Procedure: RIGHT/LEFT HEART CATH AND CORONARY ANGIOGRAPHY;  Surgeon: Leonie Man, MD;  Location: [facility];  Service: Cardiovascular;  Laterality: N/A;  . TUBAL LIGATION    . VIDEO BRONCHOSCOPY WITH ENDOBRONCHIAL ULTRASOUND Left 09/14/2018   Procedure: VIDEO BRONCHOSCOPY WITH ENDOBRONCHIAL ULTRASOUND with biopsies;  Surgeon: Margaretha Seeds, MD;  Location: [facility];  Service: Thoracic;  Laterality: Left;  :  Current Facility-Administered Medications  Medication  Dose Route Frequency Provider Last Rate Last Admin  . 0.9 %  sodium chloride infusion (Manually program via Guardrails IV Fluids)   Intravenous Once Upstill, Shari, PA-C      . enoxaparin (LOVENOX) injection 60 mg  60 mg Subcutaneous Q24H Curcio, Kristin R, NP      . insulin aspart (novoLOG) injection 0-9 Units  0-9 Units Subcutaneous TID WC  Curcio, Roselie Awkward, NP       Current Outpatient Medications  Medication Sig Dispense Refill  . ALPRAZolam (XANAX) 1 MG tablet Take 1 tablet (1 mg total) by mouth 3 (three) times daily as needed for anxiety or sleep. 60 tablet 0  . furosemide (LASIX) 40 MG tablet Take 1 tablet (40 mg total) by mouth daily. 30 tablet 1  . insulin aspart (NOVOLOG) 100 UNIT/ML injection Inject 0-9 Units into the skin See admin instructions. insulin aspart (novoLOG) injection 0-9 Units 0-9 Units,  Subcutaneous, 3 times daily with meals, CBG < 70: Implement Hypoglycemia Standing Orders and refer to Hypoglycemia Standing Orders sidebar report  CBG 70 - 120: 0 units  CBG 121 - 150: 1 unit  CBG 151 - 200: 2 units  CBG 201 - 250: 3 units  CBG 251 - 300: 5 units  CBG 301 - 350: 7 units  CBG 351 - 400: 9 units CBG > 400: 10 mL 11  . insulin detemir (LEVEMIR) 100 UNIT/ML injection Inject 0.34 mLs (34 Units total) into the skin at bedtime. (Patient taking differently: Inject 40 Units into the skin at bedtime. ) 10 mL 0  . levothyroxine (SYNTHROID, LEVOTHROID) 200 MCG tablet Take 200 mcg by mouth daily before breakfast.     . nitroGLYCERIN (NITROSTAT) 0.4 MG SL tablet Place 0.4 mg under the tongue every 5 (five) minutes as needed for chest pain.     Marland Kitchen ondansetron (ZOFRAN) 8 MG tablet Take 1 tablet (8 mg total) by mouth every 8 (eight) hours as needed for nausea or vomiting. 15 tablet 2  . oxyCODONE (OXY IR/ROXICODONE) 5 MG immediate release tablet Take 1-2 tablets (5-10 mg total) by mouth every 6 (six) hours as needed for severe pain. 90 tablet 0  . oxyCODONE-acetaminophen (PERCOCET/ROXICET) 5-325 MG tablet Take 1 tablet by mouth every 6 (six) hours as needed for moderate pain or severe pain. 15 tablet 0  . OXYGEN Inhale 2 L into the lungs continuous.    . potassium chloride SA (KLOR-CON) 20 MEQ tablet Take 1 tablet (20 mEq total) by mouth daily. 30 tablet 1  . promethazine (PHENERGAN) 12.5 MG tablet Take 1 tablet (12.5 mg  total) by mouth every 6 (six) hours as needed for nausea or vomiting. 45 tablet 1  . tiZANidine (ZANAFLEX) 4 MG tablet Take 1 tablet (4 mg total) by mouth every 8 (eight) hours as needed for muscle spasms. 30 tablet 1  . topiramate (TOPAMAX) 25 MG tablet Take 75 mg by mouth at bedtime.     Marland Kitchen albuterol (PROAIR HFA) 108 (90 Base) MCG/ACT inhaler Inhale 2 puffs into the lungs every 4 (four) hours as needed for wheezing or shortness of breath. For shortness of breath 18 g 0  . albuterol (PROVENTIL) (2.5 MG/3ML) 0.083% nebulizer solution Take 2.5 mg by nebulization 2 (two) times daily as needed for wheezing or shortness of breath.     . allopurinol (ZYLOPRIM) 300 MG tablet Take 300 mg by mouth daily.    Marland Kitchen aspirin EC 81 MG tablet Take 81 mg by mouth daily.    Marland Kitchen  atorvastatin (LIPITOR) 80 MG tablet Take 1 tablet (80 mg total) by mouth daily at 6 PM. 30 tablet 0  . busPIRone (BUSPAR) 7.5 MG tablet Take 7.5 mg by mouth 2 (two) times daily.    . cholecalciferol (VITAMIN D3) 25 MCG (1000 UT) tablet Take 5,000 Units by mouth daily.    . diphenhydrAMINE-zinc acetate (BENADRYL) cream Apply 1 application topically 3 (three) times daily as needed for itching. (Patient taking differently: Apply 1 application topically 3 (three) times daily as needed for itching. Patient uses on her head for dry scalp.) 28.4 g 2  . diphenoxylate-atropine (LOMOTIL) 2.5-0.025 MG tablet Take 1 tablet by mouth 4 (four) times daily as needed for diarrhea or loose stools.     . gabapentin (NEURONTIN) 600 MG tablet Take 600 mg by mouth 3 (three) times daily.     . hydrocortisone (ANUSOL-HC) 2.5 % rectal cream Place 1 application rectally 4 (four) times daily as needed for hemorrhoids.     . Hydrocortisone (GERHARDT'S BUTT CREAM) CREA Apply 1 application topically 4 (four) times daily as needed for irritation. 1 each 3  . nystatin (MYCOSTATIN/NYSTOP) powder Apply topically 2 (two) times daily. 30 g 0  . Omega-3 Fatty Acids (FISH OIL PO) Take 1  capsule by mouth daily.     . pantoprazole (PROTONIX) 40 MG tablet Take 1 tablet (40 mg total) by mouth 2 (two) times daily. 180 tablet 3  . polyethylene glycol (MIRALAX / GLYCOLAX) 17 g packet Take 17 g by mouth 2 (two) times daily. 30 each 0  . senna (SENOKOT) 8.6 MG TABS tablet Take 2 tablets (17.2 mg total) by mouth 2 (two) times daily. 120 tablet 0  . silver sulfADIAZINE (SILVADENE) 1 % cream Apply 1 application topically 2 (two) times daily.     . sucralfate (CARAFATE) 1 g tablet Take 1 tablet (1 g total) by mouth 4 (four) times daily -  with meals and at bedtime. 5 min before meals for radiation induced esophagitis 120 tablet 2  . SURE COMFORT INS SYR 1CC/28G 28G X 1/2" 1 ML MISC USE TO INJECT INSULIN UP TO 4 TIMES DAILY. 150 each 5  . umeclidinium-vilanterol (ANORO ELLIPTA) 62.5-25 MCG/INH AEPB Inhale 1 puff into the lungs daily. 30 each 0     Allergies  Allergen Reactions  . Dilaudid [Hydromorphone Hcl] Itching  . Midodrine Hcl Swelling    After one dose had anaphylactic reaction, had to call EMS.  Marland Kitchen Actifed Cold-Allergy [Chlorpheniramine-Phenylephrine]     "I was sick and red and it didn't agree with me at all"  . Doxycycline Nausea And Vomiting  . Other Cough    Pt states she is allergic to ragweed and that she starts coughing and sneezing like crazy  . Penicillins Hives and Itching    Tolerates Rocephin Has patient had a PCN reaction causing immediate rash, facial/tongue/throat swelling, SOB or lightheadedness with hypotension: Yes Has patient had a PCN reaction causing severe rash involving mucus membranes or skin necrosis: No Has patient had a PCN reaction that required hospitalization Yes Has patient had a PCN reaction occurring within the last 10 years: No If all of the above answers are "NO", then may proceed with Cephalosporin use.   . Reglan [Metoclopramide] Itching  . Valium [Diazepam] Itching  . Vistaril [Hydroxyzine Hcl] Itching  :  Family History  Problem  Relation Age of Onset  . Stroke Mother        deceased 62   . Heart attack Father  deceased 89   . Diabetes Brother   . Cerebral palsy Brother   . Pneumonia Brother   . Diabetes Other   . Heart attack Other   . Colon cancer Neg Hx   :  Social History   Socioeconomic History  . Marital status: Widowed    Spouse name: Not on file  . Number of children: 3  . Years of education: Not on file  . Highest education level: Not on file  Occupational History    Comment: retired from [employer], worked Psychologist, sport and exercise  Tobacco Use  . Smoking status: Former Smoker    Packs/day: 1.50    Years: 45.00    Pack years: 67.50    Types: Cigarettes    Quit date: 11/24/2014    Years since quitting: 4.4  . Smokeless tobacco: Never Used  Substance and Sexual Activity  . Alcohol use: No    Alcohol/week: 0.0 standard drinks  . Drug use: No  . Sexual activity: Not Currently    Birth control/protection: Surgical  Other Topics Concern  . Not on file  Social History Narrative   Has 3 adopted children. Two live in [place].   Social Determinants of Health   Financial Resource Strain:   . Difficulty of Paying Living Expenses: Not on file  Food Insecurity: Unknown  . Worried About Charity fundraiser in the Last Year: Patient refused  . Ran Out of Food in the Last Year: Patient refused  Transportation Needs: Unknown  . Lack of Transportation (Medical): Patient refused  . Lack of Transportation (Non-Medical): Patient refused  Physical Activity: Unknown  . Days of Exercise per Week: Patient refused  . Minutes of Exercise per Session: Patient refused  Stress: Unknown  . Feeling of Stress : Patient refused  Social Connections: Unknown  . Frequency of Communication with Friends and Family: Patient refused  . Frequency of Social Gatherings with Friends and Family: Patient refused  . Attends Religious Services: Patient refused  . Active Member of Clubs or Organizations: Patient refused  . Attends  Archivist Meetings: Patient refused  . Marital Status: Patient refused  Intimate Partner Violence: Unknown  . Fear of Current or Ex-Partner: Patient refused  . Emotionally Abused: Patient refused  . Physically Abused: Patient refused  . Sexually Abused: Patient refused  :  Review of Systems: A comprehensive 14 point review of systems was negative except as noted in the HPI.  Exam: Patient Vitals for the past 24 hrs:  BP Temp Temp src Pulse Resp SpO2 Height Weight  05/19/19 0831 (!) 126/57 -- -- 97 20 100 % -- --  05/19/19 0439 139/61 -- -- 81 16 100 % -- --  05/19/19 0059 (!) 143/61 99 F (37.2 C) Oral 87 17 100 % -- --  05/18/19 2326 (!) 125/57 98.8 F (37.1 C) Oral 80 16 98 % -- --  05/18/19 2040 -- -- -- -- -- 99 % -- --  05/18/19 2039 (!) 132/50 100.2 F (37.9 C) Oral 70 20 100 % 5\' 6"  (1.676 m) 288 lb (130.6 kg)    General: Chronically ill-appearing female, no distress Eyes:  no scleral icterus.   ENT: Oropharynx is dry, there were no oropharyngeal lesions.   Neck was without thyromegaly.   Lymphatics:  Negative cervical, supraclavicular or axillary adenopathy.   Respiratory: lungs were clear bilaterally without wheezing or crackles.   Cardiovascular:  Regular rate and rhythm, S1/S2, without murmur, rub or gallop.  Significant bilateral lower extremity edema  without evidence of cellulitis. GI:  abdomen was soft, flat, nontender, nondistended, without organomegaly.   Musculoskeletal:  no spinal tenderness of palpation of vertebral spine.   Skin exam was without echymosis, petichae.   Neuro exam was nonfocal. Patient was alert and oriented.  Attention was good.   Language was appropriate.  Mood was normal without depression.  Speech was not pressured.  Thought content was not tangential.     Lab Results  Component Value Date   WBC 5.4 05/19/2019   HGB 7.1 (L) 05/19/2019   HCT 25.4 (L) 05/19/2019   PLT 289 05/19/2019   GLUCOSE 233 (H) 05/19/2019   CHOL 90  07/06/2018   TRIG 90 07/06/2018   HDL 35 (L) 07/06/2018   LDLCALC 37 07/06/2018   ALT 22 05/19/2019   AST 32 05/19/2019   NA 139 05/19/2019   K 4.7 05/19/2019   CL 103 05/19/2019   CREATININE 1.45 (H) 05/19/2019   BUN 31 (H) 05/19/2019   CO2 24 05/19/2019    No results found.   No results found.  Assessment and Plan:  1.  Anemia 2.  Failure to thrive 3.  Fever-probable left pneumonia  4.  Small cell lung cancer with brain metastases 5.  Colorectal cancer, untreated  6.  CKD 7.  Chronic lower extremity edema 8.  Diabetes mellitus 9.  Hypertension 10.  CHF 11.  CAD 15.  Rheumatoid arthritis and chronic back pain 13.  Depression and anxiety 14.  Goals of care  -2 units PRBCs have been ordered are pending transfusion.  Will check a daily CBC and transfuse to keep her hemoglobin above 8.  Stool for occult blood has been ordered and is pending. -The patient has generalized weakness and failure to thrive likely due to her underlying cancer.  The patient has expressed that she is not willing to give up.  Palliative care has been following her in the home and I have again recommended that she consider hospice.  She continues to be resistant to this idea. -The patient was noted to have a her upon presentation to the ER.  Her white blood cell count is normal.  UA is pending.  Will request a chest x-ray and blood cultures.  We will hold off on antibiotics for now until the UA and chest x-ray results are available to [place]. -Renal function is at baseline.  Will monitor. -CBGs have been ordered to before meals and at bedtime.  We will continue home insulin and sliding scale. -Continue home medications for hypertension, CHF, and CAD. -I have ordered oxycodone 5 to 10 mg every 6 hours as needed. -I have ordered her home medications for depression and anxiety. -Discussed CODE STATUS with the patient.  She confirmed that she wishes to be a DNR/DNI.  I have placed this order.  Mikey Bussing,  DNP, AGPCNP-BC, AOCNP  Addendum  I have seen the patient, examined her. I agree with the assessment and and plan and have edited the notes.   I have personally reviewed her CXR, she likely has left upper lobe pneumonia, she has fever and cough at home also, will start her on ceftriaxone and azithromycin. Continue supportive care, will give iv lasix daily for fluid overload. Will continue discuss goal of care with pt and her daughter. She is DNR.   Please call me [phone] if any questions.  Truitt Merle  05/19/2019

## 2019-05-20 DIAGNOSIS — D500 Iron deficiency anemia secondary to blood loss (chronic): Secondary | ICD-10-CM

## 2019-05-20 DIAGNOSIS — C183 Malignant neoplasm of hepatic flexure: Secondary | ICD-10-CM

## 2019-05-20 DIAGNOSIS — C3412 Malignant neoplasm of upper lobe, left bronchus or lung: Secondary | ICD-10-CM

## 2019-05-20 DIAGNOSIS — I5032 Chronic diastolic (congestive) heart failure: Secondary | ICD-10-CM

## 2019-05-20 DIAGNOSIS — J189 Pneumonia, unspecified organism: Secondary | ICD-10-CM

## 2019-05-20 LAB — GLUCOSE, CAPILLARY
Glucose-Capillary: 123 mg/dL — ABNORMAL HIGH (ref 70–99)
Glucose-Capillary: 124 mg/dL — ABNORMAL HIGH (ref 70–99)
Glucose-Capillary: 87 mg/dL (ref 70–99)
Glucose-Capillary: 95 mg/dL (ref 70–99)

## 2019-05-20 LAB — COMPREHENSIVE METABOLIC PANEL
ALT: 14 U/L (ref 0–44)
AST: 19 U/L (ref 15–41)
Albumin: 2.7 g/dL — ABNORMAL LOW (ref 3.5–5.0)
Alkaline Phosphatase: 64 U/L (ref 38–126)
Anion gap: 9 (ref 5–15)
BUN: 23 mg/dL (ref 8–23)
CO2: 26 mmol/L (ref 22–32)
Calcium: 8.4 mg/dL — ABNORMAL LOW (ref 8.9–10.3)
Chloride: 104 mmol/L (ref 98–111)
Creatinine, Ser: 1.15 mg/dL — ABNORMAL HIGH (ref 0.44–1.00)
GFR calc Af Amer: 56 mL/min — ABNORMAL LOW (ref >60)
GFR calc non Af Amer: 49 mL/min — ABNORMAL LOW (ref >60)
Glucose, Bld: 173 mg/dL — ABNORMAL HIGH (ref 70–99)
Potassium: 4.2 mmol/L (ref 3.5–5.1)
Sodium: 139 mmol/L (ref 135–145)
Total Bilirubin: 0.7 mg/dL (ref 0.3–1.2)
Total Protein: 5.2 g/dL — ABNORMAL LOW (ref 6.5–8.1)

## 2019-05-20 LAB — CBC
HCT: 26.6 % — ABNORMAL LOW (ref 36.0–46.0)
Hemoglobin: 8 g/dL — ABNORMAL LOW (ref 12.0–15.0)
MCH: 29.5 pg (ref 26.0–34.0)
MCHC: 30.1 g/dL (ref 30.0–36.0)
MCV: 98.2 fL (ref 80.0–100.0)
Platelets: 241 10*3/uL (ref 150–400)
RBC: 2.71 MIL/uL — ABNORMAL LOW (ref 3.87–5.11)
RDW: 17.7 % — ABNORMAL HIGH (ref 11.5–15.5)
WBC: 5.6 10*3/uL (ref 4.0–10.5)
nRBC: 0 % (ref 0.0–0.2)

## 2019-05-20 LAB — MRSA PCR SCREENING: MRSA by PCR: NEGATIVE

## 2019-05-20 LAB — PREPARE RBC (CROSSMATCH)

## 2019-05-20 MED ORDER — SODIUM CHLORIDE 0.9% FLUSH: 10.0000 mL | INTRAVENOUS | Status: DC | PRN

## 2019-05-20 MED ORDER — HEPARIN SOD (PORK) LOCK FLUSH 100 UNIT/ML IV SOLN: 500.0000 [IU] | Freq: Every day | INTRAVENOUS | Status: DC | PRN

## 2019-05-20 MED ORDER — HEPARIN SOD (PORK) LOCK FLUSH 100 UNIT/ML IV SOLN: 250.0000 [IU] | INTRAVENOUS | Status: DC | PRN

## 2019-05-20 MED ORDER — NYSTATIN 100000 UNIT/GM EX POWD
Freq: Two times a day (BID) | CUTANEOUS | Status: DC
  Administered 2019-05-20: 1 via TOPICAL
  Filled 2019-05-20: qty 15

## 2019-05-20 MED ORDER — FLEET ENEMA 7-19 GM/118ML RE ENEM
1.0000 | ENEMA | Freq: Once | RECTAL | Status: AC
  Administered 2019-05-20: 1 via RECTAL
  Filled 2019-05-20: qty 1

## 2019-05-20 MED ORDER — ENSURE MAX PROTEIN PO LIQD
11.0000 [oz_av] | Freq: Every day | ORAL | Status: DC
  Administered 2019-05-20 – 2019-05-21 (×2): 11 [oz_av] via ORAL
  Filled 2019-05-20 (×5): qty 330

## 2019-05-20 MED ORDER — ADULT MULTIVITAMIN W/MINERALS CH
1.0000 | ORAL_TABLET | Freq: Every day | ORAL | Status: DC
  Administered 2019-05-20 – 2019-05-23 (×4): 1 via ORAL
  Filled 2019-05-20 (×5): qty 1

## 2019-05-20 MED ORDER — ACETAMINOPHEN 325 MG PO TABS
650.0000 mg | ORAL_TABLET | Freq: Once | ORAL | Status: AC
  Administered 2019-05-20: 650 mg via ORAL
  Filled 2019-05-20: qty 2

## 2019-05-20 MED ORDER — ENSURE ENLIVE PO LIQD
237.0000 mL | Freq: Two times a day (BID) | ORAL | Status: DC
  Administered 2019-05-20 – 2019-05-23 (×6): 237 mL via ORAL

## 2019-05-20 MED ORDER — SODIUM CHLORIDE 0.9% FLUSH: 3.0000 mL | INTRAVENOUS | Status: DC | PRN

## 2019-05-20 MED ORDER — FUROSEMIDE 10 MG/ML IJ SOLN
10.0000 mg | Freq: Once | INTRAMUSCULAR | Status: AC
  Administered 2019-05-20: 10 mg via INTRAVENOUS
  Filled 2019-05-20: qty 2

## 2019-05-20 MED ORDER — DIPHENHYDRAMINE HCL 25 MG PO CAPS
25.0000 mg | ORAL_CAPSULE | Freq: Once | ORAL | Status: AC
  Administered 2019-05-20: 25 mg via ORAL
  Filled 2019-05-20: qty 1

## 2019-05-20 MED ORDER — SODIUM CHLORIDE 0.9% IV SOLUTION: 250.0000 mL | Freq: Once | INTRAVENOUS | Status: DC

## 2019-05-20 MED ORDER — AZITHROMYCIN 250 MG PO TABS
500.0000 mg | ORAL_TABLET | Freq: Every day | ORAL | Status: DC
  Administered 2019-05-20 – 2019-05-23 (×5): 500 mg via ORAL
  Filled 2019-05-20 (×5): qty 2

## 2019-05-20 MED ORDER — PRO-STAT SUGAR FREE PO LIQD
30.0000 mL | Freq: Every day | ORAL | Status: DC
  Administered 2019-05-20 – 2019-05-23 (×2): 30 mL via ORAL
  Filled 2019-05-20 (×2): qty 30

## 2019-05-20 NOTE — TOC Initial Note (Addendum)
Transition of Care [facility]) - Initial/Assessment Note    Patient Details  Name: Stephanie Combs MRN: [id number] Date of Birth: [birth year]  [facility]) CM/SW Contact:    Jabarie Pop, Marjie Skiff, RN Phone Number: 05/20/2019, 2:00 PM  Clinical Narrative:                  Pt from home with hospice services through [facility]. She wishes to dc home with [facility] at discharge. TOC will continue to follow along.  1500: This CM received phone call from Stephanie Combs. Stephanie Combs states that they are following her in the community for overuse of 911 services, calling 8-10 times a day for things like warming up her food. Stephanie Combs states that they will be contacting [facility] as well and continue to follow her in the community.     Activities of Daily Living Home Assistive Devices/Equipment: Environmental consultant (specify type), Eyeglasses, Shower chair with back, CBG Meter, Wheelchair, Other (Comment)(front wheeled walker, 4 wheeled walker, ramp entrance) ADL Screening (condition at time of admission) Patient's cognitive ability adequate to safely complete daily activities?: No Is the patient deaf or have difficulty hearing?: No Does the patient have difficulty seeing, even when wearing glasses/contacts?: No Does the patient have difficulty concentrating, remembering, or making decisions?: Yes Patient able to express need for assistance with ADLs?: Yes Does the patient have difficulty dressing or bathing?: Yes Independently performs ADLs?: No Communication: Independent Dressing (OT): Needs assistance Is this a change from baseline?: Change from baseline, expected to last >3 days Grooming: Needs assistance Is this a change from baseline?: Change from baseline, expected to last >3 days Feeding: Needs assistance Is this a change from baseline?: Change from baseline, expected to last >3 days Bathing: Needs assistance Is this a change from baseline?: Change from baseline, expected to last >3  days Toileting: Dependent Is this a change from baseline?: Change from baseline, expected to last >3days In/Out Bed: Dependent Is this a change from baseline?: Change from baseline, expected to last >3 days Walks in Home: Dependent Is this a change from baseline?: Change from baseline, expected to last >3 days Does the patient have difficulty walking or climbing stairs?: Yes(secondary to weakness) Weakness of Legs: Both Weakness of Arms/Hands: Both  Permission Sought/Granted                  Emotional Assessment              Admission diagnosis:  Morbid obesity ([place]) [E66.01] Anemia [D64.9] Secondary malignant neoplasm of brain ([place]) [C79.31] Fever [R50.9] Uncontrolled type 2 diabetes mellitus with stage 3 chronic kidney disease ([place]) [E11.22, E11.65, H41.74] Chronic diastolic CHF (congestive heart failure) ([place]) [I50.32] Chronic obstructive pulmonary disease, unspecified COPD type ([place]) [J44.9] Anemia in chronic kidney disease, unspecified CKD stage [N18.9, D63.1] Community acquired pneumonia, unspecified laterality [J18.9] Patient Active Problem List   Diagnosis Date Noted  . Goals of care, counseling/discussion   . Advanced care planning/counseling discussion   . Palliative care by specialist   . Do not resuscitate   . Anemia due to GI blood loss 04/25/2019  . Pressure injury of skin 04/12/2019  . Fluid overload 04/10/2019  . Lung cancer ([place]) 04/10/2019  . Acute encephalopathy 04/02/2019  . Brain metastasis ([place]) 03/09/2019  . Acute on chronic diastolic heart failure ([place]) 10/11/2018  . Assistance needed with transportation 09/22/2018  . Primary cancer of hepatic flexure of colon ([place])   . Small cell lung cancer, left upper lobe ([place])   .  Colonic mass 09/10/2018  . Colon polyps 09/10/2018  . Cellulitis of leg, left 09/10/2018  . Pain of lower extremity   . Non-intractable vomiting   . Abdominal pain, epigastric   . Heme positive stool   . Acute on  chronic diastolic CHF (congestive heart failure) ([place]) 09/06/2018  . Symptomatic anemia 08/31/2018  . Anemia 08/30/2018  . Chronic pain 07/05/2018  . On home O2 07/05/2018  . Opioid dependence ([place]) 07/05/2018  . CAD (coronary artery disease) 05/14/2018  . AKI (acute kidney injury) ([place]) 04/05/2018  . Cellulitis 01/10/2018  . Cellulitis of right lower extremity 01/07/2018  . Altered mental status 01/06/2018  . Type 2 diabetes mellitus with hypoglycemia without coma ([place]) 11/24/2017  . Anxiety 11/24/2017  . Chronic respiratory failure with hypoxia ([place]) 11/24/2017  . Syncope 10/24/2017  . Bradycardia   . Syncope due to orthostatic hypotension 09/04/2017  . Obesity, Class III, BMI 40-49.9 (morbid obesity) ([place]) 09/04/2017  . Abnormal nuclear stress test 05/19/2017  . Atypical angina ([place]) 05/19/2017  . Edema 05/15/2017  . Skin ulcer ([place]) 01/30/2017  . Tinea cruris 10/10/2016  . Vitamin D deficiency 08/19/2016  . Personal history of noncompliance with medical treatment, presenting hazards to health 06/17/2016  . CAP (community acquired pneumonia) 07/25/2015  . Pressure ulcer 05/02/2015  . Hyponatremia 05/01/2015  . Acute-on-chronic kidney injury ([place]) 05/01/2015  . Uncontrolled type 2 diabetes mellitus with stage 3 chronic kidney disease ([place]) 05/01/2015  . Cellulitis of foot, right 03/24/2015  . Peripheral edema 12/04/2014  . Acute renal failure superimposed on stage 3 chronic kidney disease ([place]) 11/24/2014  . Bilateral lower extremity edema   . Diabetic ulcer of right great toe ([place])   . Foot ulcer due to secondary DM ([place]) 11/07/2014  . Diabetic ulcer of right foot ([place]) 11/07/2014  . Edema of lower extremity 05/27/2013  . CKD (chronic kidney disease), stage III ([place]) 04/14/2013  . Anemia in CKD (chronic kidney disease) 04/14/2013  . Chest pain 04/14/2013  . Dyspnea 04/14/2013  . Chronic diastolic CHF (congestive heart failure) ([place])   . Hypothyroidism   . COPD (chronic  obstructive pulmonary disease) ([place]) 08/07/2012  . Morbid obesity ([place]) 08/07/2012  . DM type 2 (diabetes mellitus, type 2) ([place]) 08/07/2012  . HTN (hypertension), benign 08/07/2012  . ARF (acute renal failure) ([place]) 08/07/2012   PCP:  [name], MD Pharmacy:   [place], [place] [address] [place] [address] Phone: [phone] Fax: [phone]  [place], [name] [address] [name] [address] Phone: [phone] Fax: [phone]     Social Determinants of Health (SDOH) Interventions    Readmission Risk Interventions Readmission Risk Prevention Plan 04/28/2019 04/11/2019 10/13/2018  Transportation Screening Complete Complete Complete  Medication Review (RN Care Manager) Complete Complete Complete  PCP or Specialist appointment within 3-5 days of discharge Complete Complete (No Data)  PCP/Specialist Appt Not Complete comments - - Recent admit; remains acutely ill  HRI or Home Care Consult Complete Complete Complete  SW Recovery Care/Counseling Consult Complete Not Complete Complete  SW Consult Not Complete Comments - not appropriate -  Palliative Care Screening Complete Not Applicable Not Applicable  Skilled Nursing Facility Complete Not Applicable Not Applicable  Some recent data might be hidden

## 2019-05-20 NOTE — Progress Notes (Addendum)
HEMATOLOGY-ONCOLOGY PROGRESS NOTE  SUBJECTIVE: Reports that she feels better this morning.  Still having intermittent low-grade fevers.  Reports some abdominal cramping like she needs to move her bowels.  She denies headaches and vision changes.  Denies chest pain.  Reports that her shortness of breath is at baseline.  Denies nausea or vomiting.  Has not noticed any bleeding.  Oncology History Overview Note  Cancer Staging Primary cancer of hepatic flexure of colon [facility]) Staging form: Colon and Rectum, AJCC 8th Edition - Clinical stage from 09/09/2018: Stage Unknown (cTX, cN0, cM0) - Signed by Truitt Merle, MD on 09/28/2018  Small cell lung cancer, left upper lobe ([place]) Staging form: Lung, AJCC 8th Edition - Clinical stage from 09/14/2018: Stage IIIB (cT3, cN2, cM0) - Signed by Truitt Merle, MD on 09/28/2018     Primary cancer of hepatic flexure of colon [facility])   Initial Diagnosis   Malignant neoplasm of hepatic flexure ([place])   09/09/2018 Procedure   Coloscopy by Dr Gala Romney 09/09/18  IMPRESSION -Colon mass as described in the vicinity of the hepatic flexure - status post biopsy and inking. Polyps associated with this tumor scattered all the way to the cecum were not removed; multiple splenic flexure and rectal polyps were removed as described above. Left-sided diverticulosis.  Upper endoscopy By Dr Gala Romney 09/09/18  IMPRESSION -Abnormal distal esophagus suspicious for Barrett's esophagus?biopsied. - Small hiatal hernia. - Erosive gastropathy. Biopsied. -Duodenal bulbar erosions   09/09/2018 Initial Biopsy   Diagnosis 09/09/18 1. Stomach, biopsy - GASTRIC ANTRAL MUCOSA WITH MILD NONSPECIFIC REACTIVE GASTROPATHY - GASTRIC OXYNTIC MUCOSA WITH PARIETAL CELL HYPERPLASIA AS CAN BE SEEN IN HYPERGASTRINEMIC STATES SUCH AS PROPER PATIENT IDENTIFICATION THERAPY. - WARTHIN STARRY STAIN IS NEGATIVE FOR HELICOBACTER PYLORI 2. Esophagus, biopsy - BARRETT'S ESOPHAGUS, NEGATIVE FOR DYSPLASIA 3. Colon,  polyp(s), descending - TUBULAR ADENOMA - NEGATIVE FOR HIGH-GRADE DYSPLASIA OR MALIGNANCY 4. Colon, biopsy, hepatic flexure - ADENOCARCINOMA. SEE NOTE 5. Colon, polyp(s), splenic flexure - ADENOCARCINOMA. SEE NOTE - OTHER FRAGMENTS OF TUBULAR ADENOMA(S) WITH ONE SHOWING HIGH-GRADE DYSPLASIA 6. Rectum, polyp(s) - TUBULAR ADENOMA(S) - NEGATIVE FOR HIGH-GRADE DYSPLASIA OR MALIGNANCY   09/09/2018 Cancer Staging   Staging form: Colon and Rectum, AJCC 8th Edition - Clinical stage from 09/09/2018: Stage Unknown (cTX, cN0, cM0) - Signed by Truitt Merle, MD on 09/28/2018   09/11/2018 Imaging   CT CAP 09/11/18  IMPRESSION: 1. Central perihilar left upper lobe 5.4 cm lung mass, new since 01/27/2017 chest CT, compatible with malignancy, favor primary bronchogenic carcinoma. Postobstructive pneumonia/atelectasis in the lingula. 2. Mediastinal adenopathy in the left prevascular/AP window and subcarinal stations, compatible with metastatic nodes (more likely originating from the left upper lobe lung mass). 3. Trace dependent left pleural effusion. 4. Apple-core lesion in the ascending colon, compatible with known primary colonic neoplasm. Adjacent top-normal size right mesenteric lymph node is equivocal for locoregional nodal metastasis. No additional potential findings of metastatic disease in the abdomen or pelvis. 5. Chronic findings include: Aortic Atherosclerosis (ICD10-I70.0). Borderline mild cardiomegaly. One vessel coronary atherosclerosis. Dilated main pulmonary artery, suggesting pulmonary arterial hypertension. Left adrenal adenoma. Punctate bilateral nephrolithiasis. Small hiatal hernia.   09/20/2018 Imaging   Brain MRI 09/20/18  IMPRESSION: 1. Motion degraded examination without brain metastases identified. 2. Small skull metastases not excluded. 3. Moderate to severe chronic small vessel ischemic disease.   Small cell lung cancer, left upper lobe ([place])  09/11/2018 Imaging   CT CAP  09/11/18  IMPRESSION: 1. Central perihilar left upper lobe 5.4 cm lung mass, new  since 01/27/2017 chest CT, compatible with malignancy, favor primary bronchogenic carcinoma. Postobstructive pneumonia/atelectasis in the lingula. 2. Mediastinal adenopathy in the left prevascular/AP window and subcarinal stations, compatible with metastatic nodes (more likely originating from the left upper lobe lung mass). 3. Trace dependent left pleural effusion. 4. Apple-core lesion in the ascending colon, compatible with known primary colonic neoplasm. Adjacent top-normal size right mesenteric lymph node is equivocal for locoregional nodal metastasis. No additional potential findings of metastatic disease in the abdomen or pelvis. 5. Chronic findings include: Aortic Atherosclerosis (ICD10-I70.0). Borderline mild cardiomegaly. One vessel coronary atherosclerosis. Dilated main pulmonary artery, suggesting pulmonary arterial hypertension. Left adrenal adenoma. Punctate bilateral nephrolithiasis. Small hiatal hernia.    09/14/2018 Initial Biopsy   Diagnosis 5/5.20 Lung, biopsy, LUL - SMALL CELL CARCINOMA.   09/14/2018 Cancer Staging   Staging form: Lung, AJCC 8th Edition - Clinical stage from 09/14/2018: Stage IIIB (cT3, cN2, cM0) - Signed by Truitt Merle, MD on 09/28/2018   09/15/2018 Initial Diagnosis   Small cell lung cancer, left upper lobe ([place])   09/18/2018 - 12/17/2018 Chemotherapy   Carboplatin and etoposide every 3 weeks for 4 cycles starting 09/18/18. Held after C2D1 due to COVID-19(+). She restarted with cycle 3 on 11/03/18. Completed 5 cycles on 12/17/18.  RT was concurrnet but started on 11/24/18.   09/20/2018 Imaging   Brain MRI 09/20/18  IMPRESSION: 1. Motion degraded examination without brain metastases identified. 2. Small skull metastases not excluded. 3. Moderate to severe chronic small vessel ischemic disease.    11/24/2018 - 01/06/2019 Radiation Therapy    RT was postponed to 11/24/18. Only  concurrent with chemo for 2 cycles. Completed on 01/06/19   03/03/2019 Imaging   MRI Brain  IMPRESSION: Interval development of 3 metastatic deposits in the brain as above   Atrophy and moderate chronic microvascular ischemic changes in the white matter.   03/16/2019 - 03/29/2019 Radiation Therapy   RT to Brain with Dr. Lisbeth Renshaw 03/16/19-03/29/19      REVIEW OF SYSTEMS:   Noncontributory except as noted in the HPI.  I have reviewed the past medical history, past surgical history, social history and family history with the patient and they are unchanged from previous note.   PHYSICAL EXAMINATION: ECOG PERFORMANCE STATUS: 3 - Symptomatic, >50% confined to bed  Vitals:   05/19/19 2114 05/20/19 0621  BP: (!) 136/59 134/60  Pulse: (!) 101   Resp: 20 20  Temp: 99.9 F (37.7 C) 99.8 F (37.7 C)  SpO2: 93% 95%   Filed Weights   05/18/19 2039 05/19/19 1757  Weight: 288 lb (130.6 kg) 292 lb 4.8 oz (132.6 kg)    Intake/Output from previous day: 01/07 0701 - 01/08 0700 In: 1236.9 [I.V.:60; Blood:811.5; IV Piggyback:365.4] Out: 1425 [Urine:1425]  GENERAL: Chronically ill-appearing female, no distress EYES: normal, Conjunctiva are pink and non-injected, sclera clear OROPHARYNX:no exudate, no erythema and lips, buccal mucosa, and tongue normal  NECK: supple, thyroid normal size, non-tender, without nodularity LYMPH:  no palpable lymphadenopathy in the cervical, axillary or inguinal LUNGS: clear to auscultation and percussion with normal breathing effort HEART: regular rate & rhythm and no murmurs.  Significant bilateral lower extremity edema without evidence cellulitis. ABDOMEN:abdomen soft, non-tender and normal bowel sounds Musculoskeletal:no cyanosis of digits and no clubbing  NEURO: alert & oriented x 3 with fluent speech, no focal motor/sensory deficits  LABORATORY DATA:  I have reviewed the data as listed CMP Latest Ref Rng & Units 05/20/2019 05/19/2019 05/19/2019  Glucose 70 -  99 mg/dL  173(H) - 233(H)  BUN 8 - 23 mg/dL 23 - 31(H)  Creatinine 0.44 - 1.00 mg/dL 1.15(H) 1.45(H) 1.55(H)  Sodium 135 - 145 mmol/L 139 - 139  Potassium 3.5 - 5.1 mmol/L 4.2 - 4.7  Chloride 98 - 111 mmol/L 104 - 103  CO2 22 - 32 mmol/L 26 - 24  Calcium 8.9 - 10.3 mg/dL 8.4(L) - 8.7(L)  Total Protein 6.5 - 8.1 g/dL 5.2(L) - 6.6  Total Bilirubin 0.3 - 1.2 mg/dL 0.7 - 0.9  Alkaline Phos 38 - 126 U/L 64 - 82  AST 15 - 41 U/L 19 - 32  ALT 0 - 44 U/L 14 - 22    Lab Results  Component Value Date   WBC 5.6 05/20/2019   HGB 8.0 (L) 05/20/2019   HCT 26.6 (L) 05/20/2019   MCV 98.2 05/20/2019   PLT 241 05/20/2019   NEUTROABS 4.1 05/19/2019    DG Chest 2 View  Result Date: 05/19/2019 CLINICAL DATA:  Fever EXAM: CHEST - 2 VIEW COMPARISON:  04/09/2019 FINDINGS: Study is rotated. Cardiomegaly. Patchy opacity suspected in the left upper lobe peripherally. Right lung clear. No effusions or acute bony abnormality. IMPRESSION: Concern for patchy peripheral left upper lobe airspace opacity/pneumonia. Electronically Signed   By: Rolm Baptise M.D.   On: 05/19/2019 10:46    ASSESSMENT AND PLAN: 1.  Anemia 2.  Failure to thrive 3.  Fever-probable left pneumonia  4.  Small cell lung cancer with brain metastases 5.  Colorectal cancer, untreated  6.  CKD 7.  Chronic lower extremity edema 8.  Diabetes mellitus 9.  Hypertension 10.  CHF 11.  CAD 46.  Rheumatoid arthritis and chronic back pain 13.  Depression and anxiety 14.  Goals of care  -Hemoglobin today has improved to 8.0.  No transfusion is indicated.  Recommend PRBC transfusion for hemoglobin less than 7. -Still having intermittent low-grade fevers.  Blood cultures negative to date.  Continue IV antibiotics.  When she remains fever free for at least 24 hours, we can consider discharge. -The patient is currently on observation for her small cell lung cancer and colorectal cancer.  No additional chemotherapy or immunotherapy is  recommended. -Renal function remains at baseline.  Will monitor. -Continue CBGs AC at bedtime and will continue home insulin sliding scale. -Continue home medications for hypertension, CHF, CAD. -Continue oxycodone 5 to 10 mg every 6 hours as needed for pain. -Continue home medications for depression and anxiety. -Goals of care were again discussed with the patient.  She is now agreeable to hospice.  She is clearly stated to me today that she does not want to go to a skilled nursing facility but instead wants to go home with hospice.  Transition of care consult has been placed.  She is a DNR/DNI.    LOS: 1 day   Mikey Bussing, DNP, AGPCNP-BC, AOCNP 05/20/19   Addendum  Pt is more awake and alert today, back to her base line.   We discussed the goal of care in length again.  Due to her poor performance status and multiple comorbidities, she is not a candidate for chemotherapy.  I recommend her to restart hospice home care.  She agrees.  Her hemoglobin has improved to 8.0, due to the ongoing small GI bleeding, and she is going home with hospice in the next few days, I will give 1 unit of blood today.  Spoke with her daughter on the phone about above, she agrees with the plan.  If  she remains to be afebrile for more than 24 hours, okay to discharge over the weekend, with oral antibiotics, and home hospice. I will let my nurse to inform hospice service.   Truitt Merle  05/20/2019

## 2019-05-20 NOTE — Progress Notes (Signed)
Initial Nutrition Assessment  DOCUMENTATION CODES:   Morbid obesity  INTERVENTION:  - will order Ensure Enlive BID, each supplement provides 350 kcal and 20 grams of protein. - will order Ensure Max once/day, each supplement provides 150 kcal and 30 grams protein. - will order 30 mL Prostat once/day, each supplement provides 100 kcal and 15 grams of protein. - will order daily multivitamin with minerals.  - continue to encourage PO intakes.    NUTRITION DIAGNOSIS:   Increased nutrient needs related to catabolic illness, cancer and cancer related treatments as evidenced by estimated needs.  GOAL:   Patient will meet greater than or equal to 90% of their needs  MONITOR:   PO intake, Supplement acceptance, Labs, Weight trends  REASON FOR ASSESSMENT:   Consult Assessment of nutrition requirement/status  ASSESSMENT:   69 year old female who is well-known to our practice and has a diagnosis of small cell lung cancer with brain metastases and colon cancer.  The patient has not received any recent chemotherapy and is on observation.  He presented to the emergency room with increased weakness, fatigue, and anorexia.  She was noted to have a temperature of 100.2 on admission.  CBC showed a hemoglobin of 7.1, glucose 233, BUN 31, creatinine 1.55, calcium 8.7, albumin 3.3.  COVID-19 testing is currently pending.  She reports shortness of breath but is not having any cough.  She has not noticed any recent bleeding.  She reports diffuse pain as well as nausea.  She is not currently having any vomiting.  She is not reporting any headaches or vision changes.  Denies chest discomfort.  No constipation or diarrhea reported.  She also reports ongoing lower extremity edema which has not really responded to Lasix. The patient is being admitted for treatment of her anemia and failure to thrive.  No intakes documented since admission. She was assessed by this RD a month ago at which time she was eating  75-100% of meals. Patient reports that since that time she has had a slow, steady decrease in appetite and desire to eat. She has been experiencing intermittent nausea over the past 1-2 weeks, but has not had a time when she vomited.   Per chart review, current weight is 292 lb and weight has been stable or up over the past 4 months.  Per notes: - plan for patient to go home on hospice - Oncology following this admission and states no additional chemo or immunotherapy is recommended   Labs reviewed; CBGs: 123 and 124 mg/dl, creatinine: 1.15 mg/dl, Ca: 8.4 mg/dl, GFR: 49 ml/min. Medications reviewed; 5000 units cholecalciferol/day, 10 mg IV lasix x1 dose 1/8, 20 mg IV lasix/day, sliding scale novolog, 34 units levemir/day, 200 mcg oral synthroid/day, 1 g lovaza/day, 40 mg oral protonix BID, 1 packet miralax BID, 2 tablets senokot BID, 1 g carafate TID.      NUTRITION - FOCUSED PHYSICAL EXAM:  completed; no muscle or fat wasting, mild to moderate edema to BLE.   Diet Order:   Diet Order            Diet Carb Modified Fluid consistency: Thin; Room service appropriate? Yes  Diet effective now              EDUCATION NEEDS:   No education needs have been identified at this time  Skin:  Skin Assessment: Skin Integrity Issues: Skin Integrity Issues:: Stage II Stage II: bilateral thighs; perineum  Last BM:  PTA/unknown  Height:   Ht Readings from Last  1 Encounters:  05/19/19 5\' 6"  (1.676 m)    Weight:   Wt Readings from Last 1 Encounters:  05/19/19 132.6 kg    Ideal Body Weight:  59.1 kg  BMI:  Body mass index is 47.18 kg/m.  Estimated Nutritional Needs:   Kcal:  2300-2500 kcal  Protein:  115-130 grams  Fluid:  >/= 2.3L/day     Jarome Matin, MS, RD, LDN, [facility] Inpatient Clinical Dietitian Pager # [phone] After hours/weekend pager # [phone]

## 2019-05-20 NOTE — Progress Notes (Signed)
PHARMACIST - PHYSICIAN COMMUNICATION  CONCERNING: Antibiotic IV to Oral Route Change Policy  RECOMMENDATION: This patient is receiving azithromycin by the intravenous route.  Based on criteria approved by the Pharmacy and Therapeutics Committee, the antibiotic(s) is/are being converted to the equivalent oral dose form(s).   DESCRIPTION: These criteria include:  Patient being treated for a respiratory tract infection, urinary tract infection, cellulitis or clostridium difficile associated diarrhea if on metronidazole  The patient is not neutropenic and does not exhibit a GI malabsorption state  The patient is eating (either orally or via tube) and/or has been taking other orally administered medications for a least 24 hours  The patient is improving clinically and has a Tmax < 100.5  If you have questions about this conversion, please contact the Pharmacy Department  []  ( [phone] )  [place] []  ( [phone] )  [facility] []  ( [phone]  [place] []  ( [phone] )  [facility] [x]  ( [phone] )  [facility]  

## 2019-05-20 NOTE — Progress Notes (Signed)
Manufacturing engineer [facility]) Hospital Liaison GIP note.  This is a related and covered GIP admission oof 05/19/2019 with an ACC diagnosis of metastatic lung cancer per Dr. Gilford Rile. Patient was experiencing increase weakness and wanted to go to the hospital for evaluation and treatment. Patient was admitted for treatment of anemia an failure to thrive.   Patient is alert and oriented. Reports feeling better this morning.  Has received medicaiton for pain and anxiety.   VS: 99.8, 134/60, 98, 20, 95% on 1Lnc I/O: 1236.01/1424  Abnormal labs: Glucose 173, Creatinine 1.15, Calcium 8.4, Albumin 2.7, Total protein 5.2, GFR 49, RBC 2.71, Hgb 8.0, HCT 26.6  Epic imaging: IMPRESSION: Concern for patchy peripheral left upper lobe airspace opacity/pneumonia.  Continuous medications and PRN: Rocephin 2gm IVPB QD, Xanax 1mg  PRN TID @ 0954, Oxycodone 5-10mg  Q 6hrs PRN @ 3419  Per MD notes: Assessment and Plan:  1.  Anemia 2.  Failure to thrive 3.  Fever-probable left pneumonia  4.  Small cell lung cancer with brain metastases 5.  Colorectal cancer, untreated  6.  CKD 7.  Chronic lower extremity edema 8.  Diabetes mellitus 9.  Hypertension 10.  CHF 11.  CAD 76.  Rheumatoid arthritis and chronic back pain 13.  Depression and anxiety 14.  Goals of care  -2 units PRBCs have been ordered are pending transfusion.  Will check a daily CBC and transfuse to keep her hemoglobin above 8.  Stool for occult blood has been ordered and is pending. -The patient has generalized weakness and failure to thrive likely due to her underlying cancer.  The patient has expressed that she is not willing to give up.  Palliative care has been following her in the home and I have again recommended that she consider hospice.  She continues to be resistant to this idea. -The patient was noted to have a her upon presentation to the ER.  Her white blood cell count is normal.  UA is pending.  Will request a chest x-ray and blood  cultures.  We will hold off on antibiotics for now until the UA and chest x-ray results are available to [place]. -Renal function is at baseline.  Will monitor. -CBGs have been ordered to before meals and at bedtime.  We will continue home insulin and sliding scale. -Continue home medications for hypertension, CHF, and CAD. -I have ordered oxycodone 5 to 10 mg every 6 hours as needed. -I have ordered her home medications for depression and anxiety. -Discussed CODE STATUS with the patient.  She confirmed that she wishes to be a DNR/DNI.  I have placed this order.  Goals of Care: Patient remains a DNR. She wants to have symptoms managed and return home with hospice once improved.  Communication with PCG: Spoke with daughter [name] by phone.  Communication with IDG: team updated.  Please used [facility] if ambulance transport is required at discharge.   Please call with any hospice related questions.  Thank you, Farrel Gordon, RN, [place] [facility] Liaison [phone]

## 2019-05-21 DIAGNOSIS — R509 Fever, unspecified: Secondary | ICD-10-CM

## 2019-05-21 DIAGNOSIS — I130 Hypertensive heart and chronic kidney disease with heart failure and stage 1 through stage 4 chronic kidney disease, or unspecified chronic kidney disease: Principal | ICD-10-CM

## 2019-05-21 DIAGNOSIS — N189 Chronic kidney disease, unspecified: Secondary | ICD-10-CM

## 2019-05-21 DIAGNOSIS — E1122 Type 2 diabetes mellitus with diabetic chronic kidney disease: Secondary | ICD-10-CM

## 2019-05-21 DIAGNOSIS — C7931 Secondary malignant neoplasm of brain: Secondary | ICD-10-CM

## 2019-05-21 DIAGNOSIS — F418 Other specified anxiety disorders: Secondary | ICD-10-CM

## 2019-05-21 DIAGNOSIS — R600 Localized edema: Secondary | ICD-10-CM

## 2019-05-21 DIAGNOSIS — D649 Anemia, unspecified: Secondary | ICD-10-CM

## 2019-05-21 DIAGNOSIS — R627 Adult failure to thrive: Secondary | ICD-10-CM

## 2019-05-21 LAB — GLUCOSE, CAPILLARY
Glucose-Capillary: 116 mg/dL — ABNORMAL HIGH (ref 70–99)
Glucose-Capillary: 157 mg/dL — ABNORMAL HIGH (ref 70–99)
Glucose-Capillary: 183 mg/dL — ABNORMAL HIGH (ref 70–99)
Glucose-Capillary: 203 mg/dL — ABNORMAL HIGH (ref 70–99)

## 2019-05-21 LAB — COMPREHENSIVE METABOLIC PANEL
ALT: 12 U/L (ref 0–44)
AST: 17 U/L (ref 15–41)
Albumin: 2.5 g/dL — ABNORMAL LOW (ref 3.5–5.0)
Alkaline Phosphatase: 66 U/L (ref 38–126)
Anion gap: 10 (ref 5–15)
BUN: 18 mg/dL (ref 8–23)
CO2: 27 mmol/L (ref 22–32)
Calcium: 8.3 mg/dL — ABNORMAL LOW (ref 8.9–10.3)
Chloride: 103 mmol/L (ref 98–111)
Creatinine, Ser: 1.02 mg/dL — ABNORMAL HIGH (ref 0.44–1.00)
GFR calc Af Amer: >60 mL/min (ref >60)
GFR calc non Af Amer: 56 mL/min — ABNORMAL LOW (ref >60)
Glucose, Bld: 130 mg/dL — ABNORMAL HIGH (ref 70–99)
Potassium: 3.7 mmol/L (ref 3.5–5.1)
Sodium: 140 mmol/L (ref 135–145)
Total Bilirubin: 0.5 mg/dL (ref 0.3–1.2)
Total Protein: 5.2 g/dL — ABNORMAL LOW (ref 6.5–8.1)

## 2019-05-21 LAB — TYPE AND SCREEN
ABO/RH(D): A POS
Antibody Screen: NEGATIVE
Donor AG Type: NEGATIVE
Donor AG Type: NEGATIVE
Donor AG Type: NEGATIVE
Unit division: 0
Unit division: 0
Unit division: 0

## 2019-05-21 LAB — BPAM RBC
Blood Product Expiration Date: 202101292359
Blood Product Expiration Date: 202101292359
Blood Product Expiration Date: 202101292359
ISSUE DATE / TIME: 202101071131
ISSUE DATE / TIME: 202101071427
ISSUE DATE / TIME: 202101081753
Unit Type and Rh: 6200
Unit Type and Rh: 6200
Unit Type and Rh: 6200

## 2019-05-21 LAB — CBC
HCT: 31 % — ABNORMAL LOW (ref 36.0–46.0)
Hemoglobin: 9.4 g/dL — ABNORMAL LOW (ref 12.0–15.0)
MCH: 29.6 pg (ref 26.0–34.0)
MCHC: 30.3 g/dL (ref 30.0–36.0)
MCV: 97.5 fL (ref 80.0–100.0)
Platelets: 232 10*3/uL (ref 150–400)
RBC: 3.18 MIL/uL — ABNORMAL LOW (ref 3.87–5.11)
RDW: 17.1 % — ABNORMAL HIGH (ref 11.5–15.5)
WBC: 5.4 10*3/uL (ref 4.0–10.5)
nRBC: 0 % (ref 0.0–0.2)

## 2019-05-21 MED ORDER — SIMETHICONE 80 MG PO CHEW
80.0000 mg | CHEWABLE_TABLET | Freq: Three times a day (TID) | ORAL | Status: DC | PRN
  Administered 2019-05-21: 80 mg via ORAL
  Filled 2019-05-21: qty 1

## 2019-05-21 MED ORDER — BISACODYL 5 MG PO TBEC
5.0000 mg | DELAYED_RELEASE_TABLET | Freq: Once | ORAL | Status: AC
  Administered 2019-05-21: 5 mg via ORAL
  Filled 2019-05-21: qty 1

## 2019-05-21 NOTE — Progress Notes (Signed)
PT Cancellation Note  Patient Details Name: Stephanie Combs MRN: [id number] DOB: Oct 20, 1950   Cancelled Treatment:     PT order received but eval deferred at request of pt, "I just don't feel well, I have the chills".  RN aware, will follow in am.   Makyle Eslick 05/21/2019, 4:32 PM

## 2019-05-21 NOTE — Progress Notes (Signed)
IP PROGRESS NOTE  Subjective:   Stephanie Combs has enrolled in hospice care.  She is scheduled for discharge with hospice today, but she reports her family is unable to care for her at home.  She requests nursing facility placement. She has multiple complaints including generalized weakness and constipation  Objective: Vital signs in last 24 hours: Blood pressure (!) 145/78, pulse 86, temperature 99 F (37.2 C), temperature source Oral, resp. rate 16, height 5\' 6"  (1.676 m), weight 292 lb 4.8 oz (132.6 kg), SpO2 97 %.  Intake/Output from previous day: 01/08 0701 - 01/09 0700 In: -  Out: 850 [Urine:850]  Physical Exam:  HEENT: No thrush Lungs: Inspiratory/expiratory rhonchi at the left greater than right anterior chest, no respiratory distress Cardiac: Regular rate and rhythm Abdomen: No hepatosplenomegaly Extremities: Chronic stasis change at the lower legs bilaterally Skin: Erythema at the left heel   Lab Results: Recent Labs    05/20/19 0442 05/21/19 0532  WBC 5.6 5.4  HGB 8.0* 9.4*  HCT 26.6* 31.0*  PLT 241 232    BMET Recent Labs    05/20/19 0442 05/21/19 0532  NA 139 140  K 4.2 3.7  CL 104 103  CO2 26 27  GLUCOSE 173* 130*  BUN 23 18  CREATININE 1.15* 1.02*  CALCIUM 8.4* 8.3*     Medications: I have reviewed the patient's current medications.  Assessment/Plan:  1. Anemia 2. Failure to thrive 3. Fever-probable left pneumonia 4. Small cell lung cancer with brain metastases 5. Colorectal cancer, untreated 6. CKD 7. Chronic lower extremity edema 8. Diabetes mellitus 9. Hypertension 10. CHF 11. CAD 67. Rheumatoid arthritis and chronic back pain 13. Depression and anxiety 14. Goals of care  Stephanie Combs is admitted with failure to thrive and pneumonia.  She has a history of small cell lung cancer with brain metastases.  Stephanie Combs was scheduled to go home today with hospice care.  She reports her family is unable to care for her in  the home.  She request skilled nursing facility placement.  Hemoglobin is higher following a red cell transfusion yesterday  She continues to have low-grade fever, potentially related to pneumonia versus tumor fever.  We will consult social work to pursue nursing home placement with hospice.  We will consult PT/OT for recommendations regarding placement.  She is developing a pressure ulcer to left heel.  A heel pad will be placed.   LOS: 2 days   Betsy Coder, MD   05/21/2019, 10:50 AM

## 2019-05-21 NOTE — Progress Notes (Signed)
Manufacturing engineer [facility]) Hospital Liaison GIP note: Dirk Dress [phone]   This is a related and covered GIP admission of 05/19/2019 with an [place] diagnosis of metastatic lung cancer per Dr. Gilford Rile. Patient was experiencing increased weakness and wanted to go to the hospital for evaluation and treatment. Patient was admitted for treatment of anemia and failure to thrive.    Patient is alert and oriented. Requesting dc to a facility.    Vital signs in last 24 hours: Blood pressure (!) 145/78, pulse 86, temperature 99 F (37.2 C), temperature source Oral, resp. rate 16, height 5\' 6"  (1.676 m), weight 292 lb 4.8 oz (132.6 kg), SpO2 97 %.   Abnormal labs: Hgb 9.4, albumin 2.5, protein 5.2, Ca 8.3, Glucose 130   Epic imaging: IMPRESSION: Concern for patchy peripheral left upper lobe airspace opacity/pneumonia.    Per MD notes: Assessment/Plan:   1.  Anemia 2.  Failure to thrive 3.  Fever-probable left pneumonia  4.  Small cell lung cancer with brain metastases 5.  Colorectal cancer, untreated  6.  CKD 7.  Chronic lower extremity edema 8.  Diabetes mellitus 9.  Hypertension 10.  CHF 11.  CAD 42.  Rheumatoid arthritis and chronic back pain 13.  Depression and anxiety 14.  Goals of care   Ms. Pro is admitted with failure to thrive and pneumonia.  She has a history of small cell lung cancer with brain metastases.  Ms. Rabalais was scheduled to go home today with hospice care.  She reports her family is unable to care for her in the home.  She request skilled nursing facility placement.  Hemoglobin is higher following a red cell transfusion yesterday  She continues to have low-grade fever, potentially related to pneumonia versus tumor fever.  We will consult social work to pursue nursing home placement with hospice.  We will consult PT/OT for recommendations regarding placement.  She is developing a pressure ulcer to left heel.  A heel pad will be placed.   Goals of Care: Patient remains a  DNR. Now requesting SNF upon discharge.   Communication with PCG: Daughter UTR   Communication with IDG: team updated.   Please used [facility] if ambulance transport is required at discharge.    Please call with any hospice related questions.   Freddie Breech, RN [facility] Liaison [phone]

## 2019-05-22 LAB — COMPREHENSIVE METABOLIC PANEL
ALT: 12 U/L (ref 0–44)
AST: 14 U/L — ABNORMAL LOW (ref 15–41)
Albumin: 2.4 g/dL — ABNORMAL LOW (ref 3.5–5.0)
Alkaline Phosphatase: 69 U/L (ref 38–126)
Anion gap: 8 (ref 5–15)
BUN: 17 mg/dL (ref 8–23)
CO2: 28 mmol/L (ref 22–32)
Calcium: 8.2 mg/dL — ABNORMAL LOW (ref 8.9–10.3)
Chloride: 100 mmol/L (ref 98–111)
Creatinine, Ser: 0.91 mg/dL (ref 0.44–1.00)
GFR calc Af Amer: >60 mL/min (ref >60)
GFR calc non Af Amer: >60 mL/min (ref >60)
Glucose, Bld: 160 mg/dL — ABNORMAL HIGH (ref 70–99)
Potassium: 3.9 mmol/L (ref 3.5–5.1)
Sodium: 136 mmol/L (ref 135–145)
Total Bilirubin: 0.5 mg/dL (ref 0.3–1.2)
Total Protein: 5 g/dL — ABNORMAL LOW (ref 6.5–8.1)

## 2019-05-22 LAB — GLUCOSE, CAPILLARY
Glucose-Capillary: 165 mg/dL — ABNORMAL HIGH (ref 70–99)
Glucose-Capillary: 195 mg/dL — ABNORMAL HIGH (ref 70–99)
Glucose-Capillary: 201 mg/dL — ABNORMAL HIGH (ref 70–99)
Glucose-Capillary: 197 mg/dL — ABNORMAL HIGH (ref 70–99)

## 2019-05-22 LAB — URINE CULTURE: Culture: 40000 — AB

## 2019-05-22 LAB — CBC
HCT: 29.9 % — ABNORMAL LOW (ref 36.0–46.0)
Hemoglobin: 9.3 g/dL — ABNORMAL LOW (ref 12.0–15.0)
MCH: 29.6 pg (ref 26.0–34.0)
MCHC: 31.1 g/dL (ref 30.0–36.0)
MCV: 95.2 fL (ref 80.0–100.0)
Platelets: 226 10*3/uL (ref 150–400)
RBC: 3.14 MIL/uL — ABNORMAL LOW (ref 3.87–5.11)
RDW: 15.8 % — ABNORMAL HIGH (ref 11.5–15.5)
WBC: 5.6 10*3/uL (ref 4.0–10.5)
nRBC: 0 % (ref 0.0–0.2)

## 2019-05-22 NOTE — Evaluation (Signed)
Physical Therapy Evaluation Patient Details Name: Stephanie Combs MRN: [id number] DOB: 1951-02-18 Today's Date: 05/22/2019   History of Present Illness  Pt admitted 2* weakness, FTT and PNA  and with hx of COPD, DM, CHF, CAD, DDD, and Lung CA with brain mets.   Clinical Impression  Pt admitted as above and presenting with functional mobility limitations 2* significant deconditioning, generalized weakness, obesity, balance deficits, and very limited endurance.  Pt would benefit from follow up rehab at SNF level to maximize IND and safety.    Follow Up Recommendations SNF    Equipment Recommendations  None recommended by PT    Recommendations for Other Services OT consult     Precautions / Restrictions Precautions Precautions: Fall Restrictions Weight Bearing Restrictions: No      Mobility  Bed Mobility               General bed mobility comments: deferred with pt needing to urinate - Pure Wic in place  Transfers                    Ambulation/Gait                Stairs            Wheelchair Mobility    Modified Rankin (Stroke Patients Only)       Balance                                             Pertinent Vitals/Pain Pain Assessment: 0-10 Pain Score: 4  Pain Location: back Pain Descriptors / Indicators: Aching;Sore Pain Intervention(s): Limited activity within patient's tolerance;Monitored during session    Home Living Family/patient expects to be discharged to:: Skilled nursing facility Living Arrangements: Alone Available Help at Discharge: Family;Available PRN/intermittently Type of Home: Mobile home Home Access: Ramped entrance     Home Layout: One level Home Equipment: [place] - 2 wheels;Walker - 4 wheels;Bedside commode;Shower seat;Wheelchair - manual Additional Comments: hs rollator but prefers RW for stability at this time    Prior Function Level of Independence: Needs assistance   Gait /  Transfers Assistance Needed: Pt states she could ambulate limited distances with RW but had great difficulty getting up on her feet  ADL's / Homemaking Assistance Needed: Pt states family took turns coming to help her get to the bathroom        Hand Dominance   Dominant Hand: Right    Extremity/Trunk Assessment   Upper Extremity Assessment Upper Extremity Assessment: Generalized weakness    Lower Extremity Assessment Lower Extremity Assessment: Generalized weakness    Cervical / Trunk Assessment Cervical / Trunk Assessment: Kyphotic  Communication   Communication: No difficulties  Cognition Arousal/Alertness: Awake/alert Behavior During Therapy: WFL for tasks assessed/performed;Anxious Overall Cognitive Status: Within Functional Limits for tasks assessed                                        General Comments      Exercises     Assessment/Plan    PT Assessment Patient needs continued PT services  PT Problem List Decreased strength;Decreased activity tolerance;Decreased balance;Decreased mobility;Decreased knowledge of use of DME;Obesity;Pain       PT Treatment Interventions Gait training;Stair training;Functional mobility training;Therapeutic activities;Therapeutic exercise;Patient/family education  PT Goals (Current goals can be found in the Care Plan section)  Acute Rehab PT Goals Patient Stated Goal: SNF for rehab PT Goal Formulation: With patient Time For Goal Achievement: 05/09/19 Potential to Achieve Goals: Fair    Frequency Min 2X/week   Barriers to discharge Decreased caregiver support pt lives alone with only intermittent assist    Co-evaluation               AM-PAC PT "6 Clicks" Mobility  Outcome Measure Help needed turning from your back to your side while in a flat bed without using bedrails?: A Lot Help needed moving from lying on your back to sitting on the side of a flat bed without using bedrails?: A Lot Help  needed moving to and from a bed to a chair (including a wheelchair)?: A Lot Help needed standing up from a chair using your arms (e.g., wheelchair or bedside chair)?: A Lot Help needed to walk in hospital room?: A Lot Help needed climbing 3-5 steps with a railing? : Total 6 Click Score: 11    End of Session   Activity Tolerance: Patient limited by fatigue Patient left: in bed;with call bell/phone within reach Nurse Communication: Mobility status PT Visit Diagnosis: Unsteadiness on feet (R26.81);Other abnormalities of gait and mobility (R26.89);Muscle weakness (generalized) (M62.81)    Time: 6811-5726 PT Time Calculation (min) (ACUTE ONLY): 16 min   Charges:   PT Evaluation $PT Eval Low Complexity: 1 Low          [place] Pager [phone] Office [phone]   Xoe Hoe 05/22/2019, 1:41 PM

## 2019-05-22 NOTE — Progress Notes (Signed)
Manufacturing engineer [facility]) Hospital Liaison GIP note: Dirk Dress [phone]   This is a related and covered GIP admission of 05/19/2019 with an [place] diagnosis of metastatic lung cancer per Dr. Gilford Rile. Patient was experiencing increased weakness and wanted to go to the hospital for evaluation and treatment. Patient was admitted for treatment of anemia and failure to thrive.    Patient is alert and oriented. Requesting dc to a facility and currently awaiting bed offer.    Vital signs in last 24 hours: Blood pressure (!) 151/79, pulse 85, temperature 99.6 F (37.6 C), temperature source Oral, resp. rate 18, height 5\' 6"  (1.676 m), weight 292 lb 4.8 oz (132.6 kg), SpO2 95 %.   Abnormal labs: Hgb 9.3, albumin 2.4, protein 5.0, Ca 8.2, Glucose 160   Epic imaging: IMPRESSION: Concern for patchy peripheral left upper lobe airspace opacity/pneumonia.    Per MD notes: Assessment/Plan:   1.  Anemia 2.  Failure to thrive 3.  Fever-probable left pneumonia  4.  Small cell lung cancer with brain metastases 5.  Colorectal cancer, untreated  6.  CKD 7.  Chronic lower extremity edema 8.  Diabetes mellitus 9.  Hypertension 10.  CHF 11.  CAD 71.  Rheumatoid arthritis and chronic back pain 13.  Depression and anxiety 14.  Goals of care   Ms. Lubke is admitted with failure to thrive and pneumonia.  She has a history of small cell lung cancer with brain metastases.  Ms. Jaggers was scheduled to go home today with hospice care.  She reports her family is unable to care for her in the home.  She request skilled nursing facility placement.  Hemoglobin is higher following a red cell transfusion yesterday  She continues to have low-grade fever, potentially related to pneumonia versus tumor fever.  We will consult social work to pursue nursing home placement with hospice.  We will consult PT/OT for recommendations regarding placement.  She is developing a pressure ulcer to left heel.  A heel pad will be placed.    Goals of Care: Patient remains a DNR. Now requesting SNF upon discharge.   Communication with PCG: Daughter UTR again today   Communication with IDG: team updated.   Please used [facility] if ambulance transport is required at discharge.    Please call with any hospice related questions.   Freddie Breech, RN [facility] Liaison [phone]

## 2019-05-22 NOTE — Progress Notes (Signed)
Physical Therapy Treatment Patient Details Name: Stephanie Combs MRN: [id number] DOB: [birth year] Today's Date: 05/22/2019    History of Present Illness Pt admitted 2* weakness, FTT and PNA  and with hx of COPD, DM, CHF, CAD, DDD, and Lung CA with brain mets.     PT Comments    Pt requiring encouragement and increased time for all tasks and with c/o back pain, nausea and fatigue.  Pt stood x 5 with assist and use of RW to balance in standing.  Extended rest breaks between attempts and on final attempt, pt able to side step up side of bed before returning to supine and bed pan placed - [name] requested for room.  Follow Up Recommendations  SNF     Equipment Recommendations  None recommended by PT    Recommendations for Other Services OT consult     Precautions / Restrictions Precautions Precautions: Fall Restrictions Weight Bearing Restrictions: No    Mobility  Bed Mobility Overal bed mobility: Needs Assistance Bed Mobility: Supine to Sit;Sit to Supine     Supine to sit: Min assist;Mod assist;+2 for physical assistance;+2 for safety/equipment;HOB elevated Sit to supine: Mod assist;+2 for physical assistance;+2 for safety/equipment   General bed mobility comments: increased time with cues for sequence and physical assist to manage LEs and to control trunk  Transfers Overall transfer level: Needs assistance Equipment used: Rolling walker (2 wheeled) Transfers: Sit to/from Stand Sit to Stand: Min assist;Mod assist;+2 physical assistance;+2 safety/equipment         General transfer comment: sit<>stand x 5 with short tolerance for standing 2* c/o weakness, fatigue and dizziness.    Ambulation/Gait Ambulation/Gait assistance: Min assist;Mod assist;+2 physical assistance;+2 safety/equipment Gait Distance (Feet): 3 Feet Assistive device: Rolling walker (2 wheeled) Gait Pattern/deviations: Decreased step length - right;Step-to pattern;Decreased step length - left;Shuffle;Trunk  flexed Gait velocity: decreased   General Gait Details: Increased time; cues for sequence; Pt side-stepped up side of bed   Stairs             Wheelchair Mobility    Modified Rankin (Stroke Patients Only)       Balance Overall balance assessment: Needs assistance Sitting-balance support: No upper extremity supported;Feet supported Sitting balance-Leahy Scale: Good     Standing balance support: Bilateral upper extremity supported Standing balance-Leahy Scale: Poor Standing balance comment: using RW                            Cognition Arousal/Alertness: Awake/alert Behavior During Therapy: WFL for tasks assessed/performed;Anxious Overall Cognitive Status: Within Functional Limits for tasks assessed                                        Exercises      General Comments        Pertinent Vitals/Pain Pain Assessment: 0-10 Pain Score: 10-Worst pain ever Pain Location: back Pain Descriptors / Indicators: Aching;Burning;Sore Pain Intervention(s): Limited activity within patient's tolerance;Monitored during session;Patient requesting pain meds-RN notified;RN gave pain meds during session    Home Living Family/patient expects to be discharged to:: Skilled nursing facility Living Arrangements: Alone Available Help at Discharge: Family;Available PRN/intermittently Type of Home: Mobile home Home Access: Ramped entrance   Home Layout: One level Home Equipment: [place] - 2 wheels;Walker - 4 wheels;Bedside commode;Shower seat;Wheelchair - manual Additional Comments: hs rollator but prefers RW for stability at this  time    Prior Function Level of Independence: Needs assistance  Gait / Transfers Assistance Needed: Pt states she could ambulate limited distances with RW but had great difficulty getting up on her feet ADL's / Homemaking Assistance Needed: Pt states family took turns coming to help her get to the bathroom     PT Goals (current  goals can now be found in the care plan section) Acute Rehab PT Goals Patient Stated Goal: SNF for rehab PT Goal Formulation: With patient Time For Goal Achievement: 05/09/19 Potential to Achieve Goals: Fair Progress towards PT goals: Progressing toward goals    Frequency    Min 2X/week      PT Plan Current plan remains appropriate    Co-evaluation              AM-PAC PT "6 Clicks" Mobility   Outcome Measure  Help needed turning from your back to your side while in a flat bed without using bedrails?: A Lot Help needed moving from lying on your back to sitting on the side of a flat bed without using bedrails?: A Lot Help needed moving to and from a bed to a chair (including a wheelchair)?: A Lot Help needed standing up from a chair using your arms (e.g., wheelchair or bedside chair)?: A Lot Help needed to walk in hospital room?: A Lot Help needed climbing 3-5 steps with a railing? : Total 6 Click Score: 10    End of Session Equipment Utilized During Treatment: Gait belt Activity Tolerance: Patient limited by fatigue Patient left: in bed;with call bell/phone within reach Nurse Communication: Mobility status PT Visit Diagnosis: Unsteadiness on feet (R26.81);Other abnormalities of gait and mobility (R26.89);Muscle weakness (generalized) (M62.81)     Time: 0630-1601 PT Time Calculation (min) (ACUTE ONLY): 35 min  Charges:  $Gait Training: 8-22 mins $Therapeutic Activity: 8-22 mins                     [place] Pager [phone] Office [phone]    Debraann Livingstone 05/22/2019, 1:53 PM

## 2019-05-22 NOTE — Progress Notes (Addendum)
IP PROGRESS NOTE  Subjective:   Ms. Roedl complains of excessive flatus and nausea.  She is waiting on nursing home placement.  Objective: Vital signs in last 24 hours: Blood pressure (!) 151/79, pulse 85, temperature 99.6 F (37.6 C), temperature source Oral, resp. rate 18, height 5\' 6"  (1.676 m), weight 292 lb 4.8 oz (132.6 kg), SpO2 95 %.  Intake/Output from previous day: 01/09 0701 - 01/10 0700 In: 920 [P.O.:720; IV Piggyback:200] Out: 3000 [Urine:3000]  Physical Exam:  HEENT: No thrush Lungs: Inspiratory/expiratory rhonchi at the left anterior chest, no respiratory distress  Cardiac: Regular rate and rhythm Abdomen: No hepatosplenomegaly Extremities: Chronic stasis change at the lower legs bilaterally    Lab Results: Recent Labs    05/21/19 0532 05/22/19 0540  WBC 5.4 5.6  HGB 9.4* 9.3*  HCT 31.0* 29.9*  PLT 232 226    BMET Recent Labs    05/21/19 0532 05/22/19 0540  NA 140 136  K 3.7 3.9  CL 103 100  CO2 27 28  GLUCOSE 130* 160*  BUN 18 17  CREATININE 1.02* 0.91  CALCIUM 8.3* 8.2*     Medications: I have reviewed the patient's current medications.  Assessment/Plan:  1. Anemia 2. Failure to thrive 3. Fever-probable left pneumonia 4. Small cell lung cancer with brain metastases 5. Colorectal cancer, untreated 6. CKD 7. Chronic lower extremity edema 8. Diabetes mellitus 9. Hypertension 10. CHF 11. CAD 20. Rheumatoid arthritis and chronic back pain 13. Depression and anxiety 14. Goals of care  Ms. Winsor appears unchanged today.  She is waiting on nursing home placement with hospice.  She says that she will participate with physical therapy today. She continues to have a low-grade fever, potentially related to pneumonia or tumor fever. Can consider discontinuing some of her nonessential medications if the plan is to follow a comfort care approach.  LOS: 3 days   Betsy Coder, MD   05/22/2019, 10:11 AM

## 2019-05-23 DIAGNOSIS — C7931 Secondary malignant neoplasm of brain: Secondary | ICD-10-CM

## 2019-05-23 LAB — GLUCOSE, CAPILLARY
Glucose-Capillary: 118 mg/dL — ABNORMAL HIGH (ref 70–99)
Glucose-Capillary: 143 mg/dL — ABNORMAL HIGH (ref 70–99)
Glucose-Capillary: 147 mg/dL — ABNORMAL HIGH (ref 70–99)
Glucose-Capillary: 162 mg/dL — ABNORMAL HIGH (ref 70–99)

## 2019-05-23 NOTE — TOC Progression Note (Signed)
Transition of Care [facility]) - Progression Note    Patient Details  Name: Stephanie Combs MRN: [id number] Date of Birth: 05-06-1951  Transition of Care [facility]) CM/SW Contact  Carrington Mullenax, Marjie Skiff, RN Phone Number: 05/23/2019, 3:23 PM  Clinical Narrative:     Pt given bed offers and chose [facility]. [facility] alerted of need for bed tomorrow. Covid test to be done today in preparation for DC to [facility] tomorrow.  Readmission Risk Interventions Readmission Risk Prevention Plan 04/28/2019 04/11/2019 10/13/2018  Transportation Screening Complete Complete Complete  Medication Review Press photographer) Complete Complete Complete  PCP or Specialist appointment within 3-5 days of discharge Complete Complete (No Data)  PCP/Specialist Appt Not Complete comments - - Recent admit; remains acutely ill  HRI or Home Care Consult Complete Complete Complete  SW Recovery Care/Counseling Consult Complete Not Complete Complete  SW Consult Not Complete Comments - not appropriate -  Palliative Care Screening Complete Not Applicable Not Applicable  Skilled Nursing Facility Complete Not Applicable Not Applicable  Some recent data might be hidden

## 2019-05-23 NOTE — Progress Notes (Addendum)
HEMATOLOGY-ONCOLOGY PROGRESS NOTE  SUBJECTIVE: Drowsy this morning. Reports mild back pain. TMax 99.6. Reports constipation is improved. No new complaints this am.   Oncology History Overview Note  Cancer Staging Primary cancer of hepatic flexure of colon [facility]) Staging form: Colon and Rectum, AJCC 8th Edition - Clinical stage from 09/09/2018: Stage Unknown (cTX, cN0, cM0) - Signed by Truitt Merle, MD on 09/28/2018  Small cell lung cancer, left upper lobe ([place]) Staging form: Lung, AJCC 8th Edition - Clinical stage from 09/14/2018: Stage IIIB (cT3, cN2, cM0) - Signed by Truitt Merle, MD on 09/28/2018     Primary cancer of hepatic flexure of colon [facility])   Initial Diagnosis   Malignant neoplasm of hepatic flexure ([place])   09/09/2018 Procedure   Coloscopy by Dr Gala Romney 09/09/18  IMPRESSION -Colon mass as described in the vicinity of the hepatic flexure - status post biopsy and inking. Polyps associated with this tumor scattered all the way to the cecum were not removed; multiple splenic flexure and rectal polyps were removed as described above. Left-sided diverticulosis.  Upper endoscopy By Dr Gala Romney 09/09/18  IMPRESSION -Abnormal distal esophagus suspicious for Barrett's esophagus?biopsied. - Small hiatal hernia. - Erosive gastropathy. Biopsied. -Duodenal bulbar erosions   09/09/2018 Initial Biopsy   Diagnosis 09/09/18 1. Stomach, biopsy - GASTRIC ANTRAL MUCOSA WITH MILD NONSPECIFIC REACTIVE GASTROPATHY - GASTRIC OXYNTIC MUCOSA WITH PARIETAL CELL HYPERPLASIA AS CAN BE SEEN IN HYPERGASTRINEMIC STATES SUCH AS PROPER PATIENT IDENTIFICATION THERAPY. - WARTHIN STARRY STAIN IS NEGATIVE FOR HELICOBACTER PYLORI 2. Esophagus, biopsy - BARRETT'S ESOPHAGUS, NEGATIVE FOR DYSPLASIA 3. Colon, polyp(s), descending - TUBULAR ADENOMA - NEGATIVE FOR HIGH-GRADE DYSPLASIA OR MALIGNANCY 4. Colon, biopsy, hepatic flexure - ADENOCARCINOMA. SEE NOTE 5. Colon, polyp(s), splenic flexure - ADENOCARCINOMA. SEE NOTE -  OTHER FRAGMENTS OF TUBULAR ADENOMA(S) WITH ONE SHOWING HIGH-GRADE DYSPLASIA 6. Rectum, polyp(s) - TUBULAR ADENOMA(S) - NEGATIVE FOR HIGH-GRADE DYSPLASIA OR MALIGNANCY   09/09/2018 Cancer Staging   Staging form: Colon and Rectum, AJCC 8th Edition - Clinical stage from 09/09/2018: Stage Unknown (cTX, cN0, cM0) - Signed by Truitt Merle, MD on 09/28/2018   09/11/2018 Imaging   CT CAP 09/11/18  IMPRESSION: 1. Central perihilar left upper lobe 5.4 cm lung mass, new since 01/27/2017 chest CT, compatible with malignancy, favor primary bronchogenic carcinoma. Postobstructive pneumonia/atelectasis in the lingula. 2. Mediastinal adenopathy in the left prevascular/AP window and subcarinal stations, compatible with metastatic nodes (more likely originating from the left upper lobe lung mass). 3. Trace dependent left pleural effusion. 4. Apple-core lesion in the ascending colon, compatible with known primary colonic neoplasm. Adjacent top-normal size right mesenteric lymph node is equivocal for locoregional nodal metastasis. No additional potential findings of metastatic disease in the abdomen or pelvis. 5. Chronic findings include: Aortic Atherosclerosis (ICD10-I70.0). Borderline mild cardiomegaly. One vessel coronary atherosclerosis. Dilated main pulmonary artery, suggesting pulmonary arterial hypertension. Left adrenal adenoma. Punctate bilateral nephrolithiasis. Small hiatal hernia.   09/20/2018 Imaging   Brain MRI 09/20/18  IMPRESSION: 1. Motion degraded examination without brain metastases identified. 2. Small skull metastases not excluded. 3. Moderate to severe chronic small vessel ischemic disease.   Small cell lung cancer, left upper lobe ([place])  09/11/2018 Imaging   CT CAP 09/11/18  IMPRESSION: 1. Central perihilar left upper lobe 5.4 cm lung mass, new since 01/27/2017 chest CT, compatible with malignancy, favor primary bronchogenic carcinoma. Postobstructive pneumonia/atelectasis in  the lingula. 2. Mediastinal adenopathy in the left prevascular/AP window and subcarinal stations, compatible with metastatic nodes (more likely originating from the left upper  lobe lung mass). 3. Trace dependent left pleural effusion. 4. Apple-core lesion in the ascending colon, compatible with known primary colonic neoplasm. Adjacent top-normal size right mesenteric lymph node is equivocal for locoregional nodal metastasis. No additional potential findings of metastatic disease in the abdomen or pelvis. 5. Chronic findings include: Aortic Atherosclerosis (ICD10-I70.0). Borderline mild cardiomegaly. One vessel coronary atherosclerosis. Dilated main pulmonary artery, suggesting pulmonary arterial hypertension. Left adrenal adenoma. Punctate bilateral nephrolithiasis. Small hiatal hernia.    09/14/2018 Initial Biopsy   Diagnosis 5/5.20 Lung, biopsy, LUL - SMALL CELL CARCINOMA.   09/14/2018 Cancer Staging   Staging form: Lung, AJCC 8th Edition - Clinical stage from 09/14/2018: Stage IIIB (cT3, cN2, cM0) - Signed by Truitt Merle, MD on 09/28/2018   09/15/2018 Initial Diagnosis   Small cell lung cancer, left upper lobe ([place])   09/18/2018 - 12/17/2018 Chemotherapy   Carboplatin and etoposide every 3 weeks for 4 cycles starting 09/18/18. Held after C2D1 due to COVID-19(+). She restarted with cycle 3 on 11/03/18. Completed 5 cycles on 12/17/18.  RT was concurrnet but started on 11/24/18.   09/20/2018 Imaging   Brain MRI 09/20/18  IMPRESSION: 1. Motion degraded examination without brain metastases identified. 2. Small skull metastases not excluded. 3. Moderate to severe chronic small vessel ischemic disease.    11/24/2018 - 01/06/2019 Radiation Therapy    RT was postponed to 11/24/18. Only concurrent with chemo for 2 cycles. Completed on 01/06/19   03/03/2019 Imaging   MRI Brain  IMPRESSION: Interval development of 3 metastatic deposits in the brain as above   Atrophy and moderate chronic microvascular  ischemic changes in the white matter.   03/16/2019 - 03/29/2019 Radiation Therapy   RT to Brain with Dr. Lisbeth Renshaw 03/16/19-03/29/19      REVIEW OF SYSTEMS:   Noncontributory except as noted in the HPI.  I have reviewed the past medical history, past surgical history, social history and family history with the patient and they are unchanged from previous note.   PHYSICAL EXAMINATION: ECOG PERFORMANCE STATUS: 3 - Symptomatic, >50% confined to bed  Vitals:   05/23/19 0530 05/23/19 0854  BP: (!) 118/47   Pulse: 71   Resp: 17   Temp: 97.8 F (36.6 C)   SpO2: 100% 97%   Filed Weights   05/18/19 2039 05/19/19 1757  Weight: 288 lb (130.6 kg) 292 lb 4.8 oz (132.6 kg)    Intake/Output from previous day: 01/10 0701 - 01/11 0700 In: 0  Out: 800 [Urine:800]  GENERAL: Chronically ill-appearing female, no distress EYES: normal, Conjunctiva are pink and non-injected, sclera clear OROPHARYNX:no exudate, no erythema and lips, buccal mucosa, and tongue normal  NECK: supple, thyroid normal size, non-tender, without nodularity LYMPH:  no palpable lymphadenopathy in the cervical, axillary or inguinal LUNGS: Coarse BS in the anterior left lung field.  HEART: regular rate & rhythm and no murmurs.  Significant bilateral lower extremity edema without evidence cellulitis. ABDOMEN:abdomen soft, non-tender and normal bowel sounds Musculoskeletal:no cyanosis of digits and no clubbing  NEURO: alert & oriented x 3 with fluent speech, no focal motor/sensory deficits  LABORATORY DATA:  I have reviewed the data as listed CMP Latest Ref Rng & Units 05/22/2019 05/21/2019 05/20/2019  Glucose 70 - 99 mg/dL 160(H) 130(H) 173(H)  BUN 8 - 23 mg/dL 17 18 23   Creatinine 0.44 - 1.00 mg/dL 0.91 1.02(H) 1.15(H)  Sodium 135 - 145 mmol/L 136 140 139  Potassium 3.5 - 5.1 mmol/L 3.9 3.7 4.2  Chloride 98 - 111 mmol/L 100  103 104  CO2 22 - 32 mmol/L 28 27 26   Calcium 8.9 - 10.3 mg/dL 8.2(L) 8.3(L) 8.4(L)  Total Protein  6.5 - 8.1 g/dL 5.0(L) 5.2(L) 5.2(L)  Total Bilirubin 0.3 - 1.2 mg/dL 0.5 0.5 0.7  Alkaline Phos 38 - 126 U/L 69 66 64  AST 15 - 41 U/L 14(L) 17 19  ALT 0 - 44 U/L 12 12 14     Lab Results  Component Value Date   WBC 5.6 05/22/2019   HGB 9.3 (L) 05/22/2019   HCT 29.9 (L) 05/22/2019   MCV 95.2 05/22/2019   PLT 226 05/22/2019   NEUTROABS 4.1 05/19/2019    DG Chest 2 View  Result Date: 05/19/2019 CLINICAL DATA:  Fever EXAM: CHEST - 2 VIEW COMPARISON:  04/09/2019 FINDINGS: Study is rotated. Cardiomegaly. Patchy opacity suspected in the left upper lobe peripherally. Right lung clear. No effusions or acute bony abnormality. IMPRESSION: Concern for patchy peripheral left upper lobe airspace opacity/pneumonia. Electronically Signed   By: Rolm Baptise M.D.   On: 05/19/2019 10:46    ASSESSMENT AND PLAN: 1.  Anemia 2.  Failure to thrive 3.  Fever-probable left pneumonia  4.  Small cell lung cancer with brain metastases 5.  Colorectal cancer, untreated  6.  CKD 7.  Chronic lower extremity edema 8.  Diabetes mellitus 9.  Hypertension 10.  CHF 11.  CAD 64.  Rheumatoid arthritis and chronic back pain 13.  Depression and anxiety 14.  Goals of care  -The patient indicates that she does not have help inher home to go home with Hospice. Wants to to go to SNF.  Per case management, she cannot go to SNF and keep full hospice services.  Therefore, hospice services will be revoked for placement.  Case management in the process of attempting to find SNF bed for the patient. -Will continue oral Azithromycin for a total of 5 days.  -The patient is currently on observation for her small cell lung cancer and colorectal cancer.  No additional chemotherapy or immunotherapy is recommended. -Continue CBGs AC at bedtime and will continue home insulin sliding scale. -Continue home medications for hypertension, CHF, CAD. -Continue oxycodone 5 to 10 mg every 6 hours as needed for pain. -Continue home  medications for depression and anxiety. -Goals of care were again discussed with the patient.  Agreeable to discharge to SNF.  The patient is DNR/DNI.  Hopefully will be able to discharge her today or tomorrow if a bed is available.    LOS: 4 days   Mikey Bussing, DNP, AGPCNP-BC, AOCNP 05/23/19   Addendum  I have seen the patient, examined her. I agree with the assessment and and plan and have edited the notes.   Pt is clinically stable, waiting for SNF replacement. She does not feel comfortable to go home due to the very limited support at home and her increase need for care.  She has revoked her hospice service.    She has been afebrile, will complete 5-days antibiotics today. If SNF bed is available tomorrow, she will be discharged.  Continue supportive care.  We again discussed her goal of care is palliative, she is not a candidate for any more cancer treatment.  She is somewhat unrealistic about her prognosis, her anxiety and depression has been worse lately. I again encouraged her to discuss with Education officer, museum.   Truitt Merle  05/23/2019

## 2019-05-23 NOTE — Care Management Important Message (Signed)
Important Message  Patient Details IM Letter given to Marney Doctor RN Case Manager to present to the Patient Name: Stephanie Combs MRN: [id number] Date of Birth: 1950-08-08   Medicare Important Message Given:  Yes     Kerin Salen 05/23/2019, 11:03 AM

## 2019-05-23 NOTE — NC FL2 (Addendum)
[address] LEVEL OF CARE SCREENING TOOL     IDENTIFICATION  Patient Name: Stephanie Combs Birthdate: 11-Jul-1950 Sex: female Admission Date (Current Location): 05/19/2019  [facility] and [place] Number:  Herbalist and Address:  [facility],  [place], [place]      Provider Number: [id number]  Attending Physician Name and Address:  Truitt Merle, MD  Relative Name and Phone Number:  Silvano Rusk NI:[phone]    Current Level of Care: Hospital Recommended Level of Care: [place] Prior Approval Number:    Date Approved/Denied:   PASRR Number:  [id number]  Discharge Plan: SNF    Current Diagnoses: Patient Active Problem List   Diagnosis Date Noted  . Goals of care, counseling/discussion   . Advanced care planning/counseling discussion   . Palliative care by specialist   . Do not resuscitate   . Anemia due to GI blood loss 04/25/2019  . Pressure injury of skin 04/12/2019  . Fluid overload 04/10/2019  . Lung cancer ([place]) 04/10/2019  . Acute encephalopathy 04/02/2019  . Brain metastasis ([place]) 03/09/2019  . Acute on chronic diastolic heart failure ([place]) 10/11/2018  . Assistance needed with transportation 09/22/2018  . Primary cancer of hepatic flexure of colon ([place])   . Small cell lung cancer, left upper lobe ([place])   . Colonic mass 09/10/2018  . Colon polyps 09/10/2018  . Cellulitis of leg, left 09/10/2018  . Pain of lower extremity   . Non-intractable vomiting   . Abdominal pain, epigastric   . Heme positive stool   . Acute on chronic diastolic CHF (congestive heart failure) ([place]) 09/06/2018  . Symptomatic anemia 08/31/2018  . Anemia 08/30/2018  . Chronic pain 07/05/2018  . On home O2 07/05/2018  . Opioid dependence ([place]) 07/05/2018  . CAD (coronary artery disease) 05/14/2018  . AKI (acute kidney injury) ([place]) 04/05/2018  . Cellulitis 01/10/2018  . Cellulitis of right lower extremity 01/07/2018  .  Altered mental status 01/06/2018  . Type 2 diabetes mellitus with hypoglycemia without coma ([place]) 11/24/2017  . Anxiety 11/24/2017  . Chronic respiratory failure with hypoxia ([place]) 11/24/2017  . Syncope 10/24/2017  . Bradycardia   . Syncope due to orthostatic hypotension 09/04/2017  . Obesity, Class III, BMI 40-49.9 (morbid obesity) ([place]) 09/04/2017  . Abnormal nuclear stress test 05/19/2017  . Atypical angina ([employer]) 05/19/2017  . Edema 05/15/2017  . Skin ulcer ([place]) 01/30/2017  . Tinea cruris 10/10/2016  . Vitamin D deficiency 08/19/2016  . Personal history of noncompliance with medical treatment, presenting hazards to health 06/17/2016  . CAP (community acquired pneumonia) 07/25/2015  . Pressure ulcer 05/02/2015  . Hyponatremia 05/01/2015  . Acute-on-chronic kidney injury ([place]) 05/01/2015  . Uncontrolled type 2 diabetes mellitus with stage 3 chronic kidney disease ([place]) 05/01/2015  . Cellulitis of foot, right 03/24/2015  . Peripheral edema 12/04/2014  . Acute renal failure superimposed on stage 3 chronic kidney disease ([place]) 11/24/2014  . Bilateral lower extremity edema   . Diabetic ulcer of right great toe ([place])   . Foot ulcer due to secondary DM ([place]) 11/07/2014  . Diabetic ulcer of right foot ([place]) 11/07/2014  . Edema of lower extremity 05/27/2013  . CKD (chronic kidney disease), stage III ([place]) 04/14/2013  . Anemia in CKD (chronic kidney disease) 04/14/2013  . Chest pain 04/14/2013  . Dyspnea 04/14/2013  . Chronic diastolic CHF (congestive heart failure) ([place])   . Hypothyroidism   . COPD (chronic obstructive pulmonary disease) (  [place]) 08/07/2012  . Morbid obesity ([place]) 08/07/2012  . DM type 2 (diabetes mellitus, type 2) ([place]) 08/07/2012  . HTN (hypertension), benign 08/07/2012  . ARF (acute renal failure) ([place]) 08/07/2012    Orientation RESPIRATION BLADDER Height & Weight     Self, Time, Situation, Place    Incontinent Weight: 132.6 kg Height:  5\' 6"  (167.6 cm)   BEHAVIORAL SYMPTOMS/MOOD NEUROLOGICAL BOWEL NUTRITION STATUS      Continent    AMBULATORY STATUS COMMUNICATION OF NEEDS Skin   Extensive Assist Verbally Skin abrasions                       Personal Care Assistance Level of Assistance  Bathing, Feeding, Dressing Bathing Assistance: Limited assistance Feeding assistance: Independent Dressing Assistance: Limited assistance     Functional Limitations Info  Sight, Hearing, Speech Sight Info: Adequate Hearing Info: Adequate Speech Info: Adequate    SPECIAL CARE FACTORS FREQUENCY  PT (By licensed PT), OT (By licensed OT)     PT Frequency: 5x weekly OT Frequency: 5 x weekly            Contractures Contractures Info: Not present    Additional Factors Info  Code Status, Allergies, Psychotropic Code Status Info: DNR Allergies Info: Dilaudid, Midrodrine HCL, DOxycycline Other Penicillins, Reglan, Valium, VIstaril Psychotropic Info: Buspar, Xanax         Current Medications (05/23/2019):  This is the current hospital active medication list Current Facility-Administered Medications  Medication Dose Route Frequency Provider Last Rate Last Admin  . 0.9 %  sodium chloride infusion (Manually program via Guardrails IV Fluids)  250 mL Intravenous Once Truitt Merle, MD   Stopped at 05/20/19 1620  . albuterol (PROVENTIL) (2.5 MG/3ML) 0.083% nebulizer solution 2.5 mg  2.5 mg Nebulization BID PRN Maryanna Shape, NP      . allopurinol (ZYLOPRIM) tablet 300 mg  300 mg Oral Daily Mikey Bussing R, NP   300 mg at 05/22/19 1003  . ALPRAZolam Duanne Moron) tablet 1 mg  1 mg Oral TID PRN Maryanna Shape, NP   1 mg at 05/22/19 2208  . aspirin EC tablet 81 mg  81 mg Oral Daily Maryanna Shape, NP   81 mg at 05/22/19 1005  . atorvastatin (LIPITOR) tablet 80 mg  80 mg Oral q1800 Maryanna Shape, NP   80 mg at 05/22/19 1730  . azithromycin (ZITHROMAX) tablet 500 mg  500 mg Oral q1800 Eudelia Bunch, RPH   500 mg at 05/22/19 1730  . busPIRone  (BUSPAR) tablet 7.5 mg  7.5 mg Oral BID Mikey Bussing R, NP   7.5 mg at 05/22/19 2201  . cholecalciferol (VITAMIN D3) tablet 5,000 Units  5,000 Units Oral Daily Maryanna Shape, NP   5,000 Units at 05/22/19 1003  . diphenoxylate-atropine (LOMOTIL) 2.5-0.025 MG per tablet 1 tablet  1 tablet Oral QID PRN Mikey Bussing R, NP      . enoxaparin (LOVENOX) injection 60 mg  60 mg Subcutaneous Q24H Curcio, Kristin R, NP   60 mg at 05/22/19 1006  . feeding supplement (ENSURE ENLIVE) (ENSURE ENLIVE) liquid 237 mL  237 mL Oral BID BM Truitt Merle, MD   237 mL at 05/22/19 1006  . feeding supplement (PRO-STAT SUGAR FREE 64) liquid 30 mL  30 mL Oral Daily Truitt Merle, MD   30 mL at 05/20/19 1615  . furosemide (LASIX) injection 20 mg  20 mg Intravenous Daily Truitt Merle, MD   20 mg  at 05/22/19 1005  . gabapentin (NEURONTIN) capsule 600 mg  600 mg Oral TID Mikey Bussing R, NP   600 mg at 05/22/19 2201  . heparin lock flush 100 unit/mL  500 Units Intracatheter Daily PRN Truitt Merle, MD      . heparin lock flush 100 unit/mL  250 Units Intracatheter PRN Truitt Merle, MD      . insulin aspart (novoLOG) injection 0-9 Units  0-9 Units Subcutaneous TID WC Maryanna Shape, NP   3 Units at 05/22/19 1730  . insulin detemir (LEVEMIR) injection 34 Units  34 Units Subcutaneous QHS Maryanna Shape, NP   34 Units at 05/22/19 2202  . levothyroxine (SYNTHROID) tablet 200 mcg  200 mcg Oral Q0600 Maryanna Shape, NP   200 mcg at 05/23/19 5361  . multivitamin with minerals tablet 1 tablet  1 tablet Oral Daily Truitt Merle, MD   1 tablet at 05/22/19 1003  . nitroGLYCERIN (NITROSTAT) SL tablet 0.4 mg  0.4 mg Sublingual Q5 min PRN Maryanna Shape, NP      . nystatin (MYCOSTATIN/NYSTOP) topical powder   Topical BID Truitt Merle, MD   Given at 05/22/19 2202  . omega-3 acid ethyl esters (LOVAZA) capsule 1 g  1 g Oral Daily Curcio, Kristin R, NP   1 g at 05/22/19 1004  . ondansetron (ZOFRAN) tablet 8 mg  8 mg Oral Q8H PRN Maryanna Shape, NP    8 mg at 05/22/19 1100  . oxyCODONE (Oxy IR/ROXICODONE) immediate release tablet 5-10 mg  5-10 mg Oral Q6H PRN Maryanna Shape, NP   10 mg at 05/22/19 1100  . pantoprazole (PROTONIX) EC tablet 40 mg  40 mg Oral BID Mikey Bussing R, NP   40 mg at 05/22/19 2201  . polyethylene glycol (MIRALAX / GLYCOLAX) packet 17 g  17 g Oral BID Maryanna Shape, NP   Stopped at 05/22/19 2202  . promethazine (PHENERGAN) tablet 12.5 mg  12.5 mg Oral Q6H PRN Mikey Bussing R, NP      . protein supplement (ENSURE MAX) liquid  11 oz Oral Daily Truitt Merle, MD   11 oz at 05/21/19 2301  . senna (SENOKOT) tablet 17.2 mg  2 tablet Oral BID Mikey Bussing R, NP   17.2 mg at 05/22/19 2201  . simethicone (MYLICON) chewable tablet 80 mg  80 mg Oral TID PRN Ladell Pier, MD   80 mg at 05/21/19 2030  . sodium chloride flush (NS) 0.9 % injection 10 mL  10 mL Intracatheter PRN Truitt Merle, MD      . sodium chloride flush (NS) 0.9 % injection 3 mL  3 mL Intracatheter PRN Truitt Merle, MD      . sucralfate (CARAFATE) tablet 1 g  1 g Oral TID WC & HS Maryanna Shape, NP   1 g at 05/22/19 2201  . tiZANidine (ZANAFLEX) tablet 4 mg  4 mg Oral Q8H PRN Maryanna Shape, NP   4 mg at 05/20/19 1244  . topiramate (TOPAMAX) tablet 75 mg  75 mg Oral QHS Mikey Bussing R, NP   75 mg at 05/22/19 2201  . umeclidinium-vilanterol (ANORO ELLIPTA) 62.5-25 MCG/INH 1 puff  1 puff Inhalation Daily Maryanna Shape, NP   1 puff at 05/23/19 0854     Discharge Medications: Please see discharge summary for a list of discharge medications.  Relevant Imaging Results:  Relevant Lab Results:   Additional Information SS# [id number]  Marceline Napierala, Marjie Skiff, RN

## 2019-05-23 NOTE — TOC Progression Note (Signed)
Transition of Care [facility]) - Progression Note    Patient Details  Name: Stephanie Combs MRN: [id number] Date of Birth: Aug 25, 1950  Transition of [facility]) CM/SW Contact  Mathhew Buysse, Marjie Skiff, RN Phone Number: 05/23/2019, 9:52 AM  Clinical Narrative:    [facility] consult for SNF placement. This CM contacted [facility] liaison as pt can not go to SNF and keep full hospice services. Pt would have to revoke hospice to be sent to SNF for rehab. Acc liaison Georg Ruddle spoke with pt daughter this am and that is what they want to do. Pt will be revoking hospice and this CM will attempt to find SNF bed for pt.  Social Determinants of Health (SDOH) Interventions    Readmission Risk Interventions Readmission Risk Prevention Plan 04/28/2019 04/11/2019 10/13/2018  Transportation Screening Complete Complete Complete  Medication Review Press photographer) Complete Complete Complete  PCP or Specialist appointment within 3-5 days of discharge Complete Complete (No Data)  PCP/Specialist Appt Not Complete comments - - Recent admit; remains acutely ill  HRI or Home Care Consult Complete Complete Complete  SW Recovery Care/Counseling Consult Complete Not Complete Complete  SW Consult Not Complete Comments - not appropriate -  Palliative Care Screening Complete Not Applicable Not Applicable  Skilled Nursing Facility Complete Not Applicable Not Applicable  Some recent data might be hidden

## 2019-05-23 NOTE — Plan of Care (Signed)

## 2019-05-23 NOTE — Consult Note (Signed)
   [facility] CM Inpatient Consult   05/23/2019  AYUSHI PLA 09/25/1950 [id number]  Patient chart reviewed due to unplanned readmission risk score of 63% and readmission less than 30 days. Per chart review, Ms. Whittingham had been receiving hospice services and current disposition plan is for SNF. No THN CM needs.  Netta Cedars, MSN, RN [facility] Liaison Nurse Mobile Phone [phone]  Toll free office [phone]  .bs

## 2019-05-23 NOTE — Progress Notes (Addendum)
Manufacturing engineer [facility]) Hospital Liaison GIP note:  This is a related and covered GIP admission of 05/19/2019 with an [place] diagnosis of metastatic lung cancer per Dr. Gilford Rile. Patient was experiencing increased weakness and wanted to go to the hospital for evaluation and treatment. Patient was admitted for treatment of anemia and failure to thrive.   Patient is alert and oriented. Requesting dc to a facility and currently awaiting bed offer.   VS: 99.4, 124.49, 70, 18, 97% 2Lnc I/O: 840/900  No New Labs or imaging.   Medications: No Continuous medications. PRNs: Xanax 1mg  TID anxiety @ 1055, Oxycodone 5-10mg  QID pain @ 1055  Per Oncology MD notes: ASSESSMENT AND PLAN: 1. Anemia 2. Failure to thrive 3. Fever-probable left pneumonia 4. Small cell lung cancer with brain metastases 5. Colorectal cancer, untreated 6. CKD 7. Chronic lower extremity edema 8. Diabetes mellitus 9. Hypertension 10. CHF 11. CAD 23. Rheumatoid arthritis and chronic back pain 13. Depression and anxiety 14. Goals of care  -The patient indicates that she does not have help inher home to go home with Hospice. Wants to to go to SNF.   -Will continue oral Azithromycin for a total of 5 days.  -The patient is currently on observation for her small cell lung cancer and colorectal cancer.  No additional chemotherapy or immunotherapy is recommended. -Continue CBGs AC at bedtime and will continue home insulin sliding scale. -Continue home medications for hypertension, CHF, CAD. -Continue oxycodone 5 to 10 mg every 6 hours as needed for pain. -Continue home medications for depression and anxiety. -Goals of care were again discussed with the patient.  Agreeable to discharge to SNF.  The patient is DNR/DNI.  Hopefully will be able to discharge her today or tomorrow if a bed is available.  Goals of Care: Patient remains a DNR. Now requesting SNF upon discharge. Plan is for patient to discharge to  [place] tomorrow.   Communication with PCG: Spoke with daughter [name]. [name] states patient wants to go to rehab to increase her strength. States she is unable to stay at home unassisted in current condition. Discussed if patient goes to SNF for rehab, Medicare will not cover room and board charges while receiving rehabilitation. Patient is unable to self pay for room and board. Patient will revoke hospice benefit and be followed by [facility] palliative services.   Communication with IDG: team updated.  Please used [facility] if ambulance transport is required at discharge.   Please call with any hospice related questions.  Farrel Gordon, RN, [facility] Liaison [phone]

## 2019-05-24 DIAGNOSIS — E039 Hypothyroidism, unspecified: Secondary | ICD-10-CM | POA: Diagnosis not present

## 2019-05-24 DIAGNOSIS — R600 Localized edema: Secondary | ICD-10-CM | POA: Diagnosis not present

## 2019-05-24 DIAGNOSIS — Z794 Long term (current) use of insulin: Secondary | ICD-10-CM | POA: Diagnosis not present

## 2019-05-24 DIAGNOSIS — C183 Malignant neoplasm of hepatic flexure: Secondary | ICD-10-CM | POA: Diagnosis not present

## 2019-05-24 DIAGNOSIS — I5032 Chronic diastolic (congestive) heart failure: Secondary | ICD-10-CM | POA: Diagnosis not present

## 2019-05-24 DIAGNOSIS — C3412 Malignant neoplasm of upper lobe, left bronchus or lung: Secondary | ICD-10-CM | POA: Diagnosis not present

## 2019-05-24 DIAGNOSIS — E1122 Type 2 diabetes mellitus with diabetic chronic kidney disease: Secondary | ICD-10-CM | POA: Diagnosis not present

## 2019-05-24 DIAGNOSIS — R000 Tachycardia, unspecified: Secondary | ICD-10-CM | POA: Diagnosis not present

## 2019-05-24 DIAGNOSIS — N183 Chronic kidney disease, stage 3 unspecified: Secondary | ICD-10-CM | POA: Diagnosis not present

## 2019-05-24 DIAGNOSIS — R2689 Other abnormalities of gait and mobility: Secondary | ICD-10-CM | POA: Diagnosis not present

## 2019-05-24 DIAGNOSIS — Z743 Need for continuous supervision: Secondary | ICD-10-CM | POA: Diagnosis not present

## 2019-05-24 DIAGNOSIS — E119 Type 2 diabetes mellitus without complications: Secondary | ICD-10-CM | POA: Diagnosis not present

## 2019-05-24 DIAGNOSIS — I2510 Atherosclerotic heart disease of native coronary artery without angina pectoris: Secondary | ICD-10-CM | POA: Diagnosis not present

## 2019-05-24 DIAGNOSIS — I25118 Atherosclerotic heart disease of native coronary artery with other forms of angina pectoris: Secondary | ICD-10-CM | POA: Diagnosis not present

## 2019-05-24 DIAGNOSIS — I959 Hypotension, unspecified: Secondary | ICD-10-CM | POA: Diagnosis not present

## 2019-05-24 DIAGNOSIS — D500 Iron deficiency anemia secondary to blood loss (chronic): Secondary | ICD-10-CM | POA: Diagnosis not present

## 2019-05-24 DIAGNOSIS — Z7401 Bed confinement status: Secondary | ICD-10-CM | POA: Diagnosis not present

## 2019-05-24 DIAGNOSIS — C3490 Malignant neoplasm of unspecified part of unspecified bronchus or lung: Secondary | ICD-10-CM | POA: Diagnosis not present

## 2019-05-24 DIAGNOSIS — J449 Chronic obstructive pulmonary disease, unspecified: Secondary | ICD-10-CM | POA: Diagnosis not present

## 2019-05-24 DIAGNOSIS — D649 Anemia, unspecified: Secondary | ICD-10-CM | POA: Diagnosis not present

## 2019-05-24 DIAGNOSIS — F419 Anxiety disorder, unspecified: Secondary | ICD-10-CM | POA: Diagnosis not present

## 2019-05-24 DIAGNOSIS — L89313 Pressure ulcer of right buttock, stage 3: Secondary | ICD-10-CM | POA: Diagnosis not present

## 2019-05-24 DIAGNOSIS — N179 Acute kidney failure, unspecified: Secondary | ICD-10-CM | POA: Diagnosis not present

## 2019-05-24 DIAGNOSIS — J9611 Chronic respiratory failure with hypoxia: Secondary | ICD-10-CM | POA: Diagnosis not present

## 2019-05-24 DIAGNOSIS — C7931 Secondary malignant neoplasm of brain: Secondary | ICD-10-CM | POA: Diagnosis not present

## 2019-05-24 DIAGNOSIS — C189 Malignant neoplasm of colon, unspecified: Secondary | ICD-10-CM | POA: Diagnosis not present

## 2019-05-24 DIAGNOSIS — J181 Lobar pneumonia, unspecified organism: Secondary | ICD-10-CM | POA: Diagnosis not present

## 2019-05-24 DIAGNOSIS — J189 Pneumonia, unspecified organism: Secondary | ICD-10-CM | POA: Diagnosis not present

## 2019-05-24 DIAGNOSIS — K922 Gastrointestinal hemorrhage, unspecified: Secondary | ICD-10-CM | POA: Diagnosis not present

## 2019-05-24 DIAGNOSIS — K219 Gastro-esophageal reflux disease without esophagitis: Secondary | ICD-10-CM | POA: Diagnosis not present

## 2019-05-24 DIAGNOSIS — Z87891 Personal history of nicotine dependence: Secondary | ICD-10-CM | POA: Diagnosis not present

## 2019-05-24 DIAGNOSIS — R531 Weakness: Secondary | ICD-10-CM | POA: Diagnosis not present

## 2019-05-24 DIAGNOSIS — Z20822 Contact with and (suspected) exposure to covid-19: Secondary | ICD-10-CM | POA: Diagnosis not present

## 2019-05-24 DIAGNOSIS — E10628 Type 1 diabetes mellitus with other skin complications: Secondary | ICD-10-CM | POA: Diagnosis not present

## 2019-05-24 DIAGNOSIS — C3492 Malignant neoplasm of unspecified part of left bronchus or lung: Secondary | ICD-10-CM | POA: Diagnosis not present

## 2019-05-24 DIAGNOSIS — R195 Other fecal abnormalities: Secondary | ICD-10-CM | POA: Diagnosis not present

## 2019-05-24 DIAGNOSIS — R079 Chest pain, unspecified: Secondary | ICD-10-CM | POA: Diagnosis not present

## 2019-05-24 DIAGNOSIS — L89896 Pressure-induced deep tissue damage of other site: Secondary | ICD-10-CM | POA: Diagnosis not present

## 2019-05-24 DIAGNOSIS — C19 Malignant neoplasm of rectosigmoid junction: Secondary | ICD-10-CM | POA: Diagnosis not present

## 2019-05-24 DIAGNOSIS — L89303 Pressure ulcer of unspecified buttock, stage 3: Secondary | ICD-10-CM | POA: Diagnosis not present

## 2019-05-24 DIAGNOSIS — F1120 Opioid dependence, uncomplicated: Secondary | ICD-10-CM | POA: Diagnosis not present

## 2019-05-24 DIAGNOSIS — Z79899 Other long term (current) drug therapy: Secondary | ICD-10-CM | POA: Diagnosis not present

## 2019-05-24 DIAGNOSIS — R0789 Other chest pain: Secondary | ICD-10-CM | POA: Diagnosis not present

## 2019-05-24 DIAGNOSIS — I130 Hypertensive heart and chronic kidney disease with heart failure and stage 1 through stage 4 chronic kidney disease, or unspecified chronic kidney disease: Secondary | ICD-10-CM | POA: Diagnosis not present

## 2019-05-24 DIAGNOSIS — L03119 Cellulitis of unspecified part of limb: Secondary | ICD-10-CM | POA: Diagnosis not present

## 2019-05-24 DIAGNOSIS — E10622 Type 1 diabetes mellitus with other skin ulcer: Secondary | ICD-10-CM | POA: Diagnosis not present

## 2019-05-24 DIAGNOSIS — R627 Adult failure to thrive: Secondary | ICD-10-CM | POA: Diagnosis not present

## 2019-05-24 DIAGNOSIS — R7989 Other specified abnormal findings of blood chemistry: Secondary | ICD-10-CM | POA: Diagnosis not present

## 2019-05-24 DIAGNOSIS — M2550 Pain in unspecified joint: Secondary | ICD-10-CM | POA: Diagnosis not present

## 2019-05-24 DIAGNOSIS — M6281 Muscle weakness (generalized): Secondary | ICD-10-CM | POA: Diagnosis not present

## 2019-05-24 LAB — GLUCOSE, CAPILLARY
Glucose-Capillary: 103 mg/dL — ABNORMAL HIGH (ref 70–99)
Glucose-Capillary: 192 mg/dL — ABNORMAL HIGH (ref 70–99)
Glucose-Capillary: 165 mg/dL — ABNORMAL HIGH (ref 70–99)

## 2019-05-24 LAB — CULTURE, BLOOD (ROUTINE X 2)
Culture: NO GROWTH
Culture: NO GROWTH
Special Requests: ADEQUATE
Special Requests: ADEQUATE

## 2019-05-24 LAB — SARS CORONAVIRUS 2 (TAT 6-24 HRS): SARS Coronavirus 2: NEGATIVE

## 2019-05-24 MED ORDER — PRO-STAT SUGAR FREE PO LIQD: 30.0000 mL | Freq: Every day | ORAL | 0 refills | Status: AC

## 2019-05-24 MED ORDER — ADULT MULTIVITAMIN W/MINERALS CH: 1.0000 | ORAL_TABLET | Freq: Every day | ORAL | Status: AC

## 2019-05-24 MED ORDER — ENSURE ENLIVE PO LIQD: 237.0000 mL | Freq: Two times a day (BID) | ORAL | 12 refills | Status: AC

## 2019-05-24 MED ORDER — OXYCODONE HCL 5 MG PO TABS: 5.0000 mg | ORAL_TABLET | Freq: Four times a day (QID) | ORAL | 0 refills | Status: DC | PRN

## 2019-05-24 MED ORDER — ALPRAZOLAM 1 MG PO TABS: 1.0000 mg | ORAL_TABLET | Freq: Three times a day (TID) | ORAL | 0 refills | Status: DC | PRN

## 2019-05-24 MED ORDER — SIMETHICONE 80 MG PO CHEW: 80.0000 mg | CHEWABLE_TABLET | Freq: Three times a day (TID) | ORAL | 0 refills | Status: AC | PRN

## 2019-05-24 MED ORDER — INSULIN DETEMIR 100 UNIT/ML ~~LOC~~ SOLN: 34.0000 [IU] | Freq: Every day | SUBCUTANEOUS | 0 refills | Status: DC

## 2019-05-24 NOTE — TOC Transition Note (Signed)
Transition of Care [facility] - CM/SW Discharge Note   Patient Details  Name: Stephanie Combs MRN: [id number] Date of Birth: 1950/12/31  Transition of [facility]) CM/SW Contact:  Lynnell Catalan, RN Phone Number: 05/24/2019, 11:41 AM   Clinical Narrative:    Pt to dc to [facility] today (room A27-1). RN to call report to [phone]. [facility] scheduled for transport at 4pm. DNR and scripts placed on chart for transport.          Readmission Risk Interventions Readmission Risk Prevention Plan 04/28/2019 04/11/2019 10/13/2018  Transportation Screening Complete Complete Complete  Medication Review Press photographer) Complete Complete Complete  PCP or Specialist appointment within 3-5 days of discharge Complete Complete (No Data)  PCP/Specialist Appt Not Complete comments - - Recent admit; remains acutely ill  HRI or Home Care Consult Complete Complete Complete  SW Recovery Care/Counseling Consult Complete Not Complete Complete  SW Consult Not Complete Comments - not appropriate -  Palliative Care Screening Complete Not Applicable Not Applicable  Skilled Nursing Facility Complete Not Applicable Not Applicable  Some recent data might be hidden

## 2019-05-24 NOTE — Progress Notes (Signed)
Physical Therapy Treatment Patient Details Name: Stephanie Combs MRN: [id number] DOB: August 07, 1950 Today's Date: 05/24/2019    History of Present Illness Pt admitted 2* weakness, FTT and PNA  and with hx of COPD, DM, CHF, CAD, DDD, and Lung CA with brain mets.     PT Comments    Pt was able to take a few more steps today and some improvement in transfers.  Continues to need cues for sequencing and relaxation/breathing techniques with activity.  Requiring assist of 2.  Pt with limited tolerance to activity due to dizziness and fatigue and dyspnea.  Cont to advance as able.    Follow Up Recommendations  SNF     Equipment Recommendations  None recommended by PT    Recommendations for Other Services       Precautions / Restrictions Precautions Precautions: Fall    Mobility  Bed Mobility Overal bed mobility: Needs Assistance Bed Mobility: Supine to Sit;Sit to Supine     Supine to sit: Mod assist;+2 for physical assistance;HOB elevated Sit to supine: Mod assist;+2 for physical assistance;HOB elevated   General bed mobility comments: increased time with cues for sequence and physical assist to manage LEs and to control trunk  Transfers Overall transfer level: Needs assistance Equipment used: Rolling walker (2 wheeled) Transfers: Sit to/from Stand Sit to Stand: Min assist;+2 physical assistance;From elevated surface;Mod assist         General transfer comment: Sit to stand x 4 with short tolerance for standing secondary to c/o fatigue and dizziness;  min A x 2 from elevated bed; mod A x 2 from [facility]  Ambulation/Gait Ambulation/Gait assistance: Min assist;+2 safety/equipment Gait Distance (Feet): 3 Feet Assistive device: Rolling walker (2 wheeled) Gait Pattern/deviations: Decreased step length - right;Wide base of support;Trunk flexed Gait velocity: decreased   General Gait Details: increased tiem; cued for RW and safety; walked 3' forward (PT moved bed and placed BSC behind  pt)   Stairs             Wheelchair Mobility    Modified Rankin (Stroke Patients Only)       Balance Overall balance assessment: Needs assistance Sitting-balance support: No upper extremity supported;Feet supported Sitting balance-Leahy Scale: Good     Standing balance support: Bilateral upper extremity supported Standing balance-Leahy Scale: Fair Standing balance comment: using RW                            Cognition Arousal/Alertness: Awake/alert Behavior During Therapy: WFL for tasks assessed/performed;Anxious Overall Cognitive Status: Within Functional Limits for tasks assessed                                        Exercises      General Comments General comments (skin integrity, edema, etc.): O2 sats were 96% or > on 2 LPM O2;  HR 95 bpm rest and 115 bpm activity;  Pt with c/o "suffocating, can't catch breath" during recovery- sats stable and pt was able to maintain talking      Pertinent Vitals/Pain Pain Assessment: Faces Faces Pain Scale: Hurts a little bit Pain Location: back Pain Descriptors / Indicators: Aching;Sore Pain Intervention(s): Limited activity within patient's tolerance;Monitored during session;Repositioned    Home Living                      Prior Function  PT Goals (current goals can now be found in the care plan section) Progress towards PT goals: Progressing toward goals    Frequency    Min 2X/week      PT Plan Current plan remains appropriate    Co-evaluation              AM-PAC PT "6 Clicks" Mobility   Outcome Measure  Help needed turning from your back to your side while in a flat bed without using bedrails?: A Lot Help needed moving from lying on your back to sitting on the side of a flat bed without using bedrails?: A Lot Help needed moving to and from a bed to a chair (including a wheelchair)?: A Little Help needed standing up from a chair using your arms  (e.g., wheelchair or bedside chair)?: A Little Help needed to walk in hospital room?: A Little Help needed climbing 3-5 steps with a railing? : Total 6 Click Score: 14    End of Session Equipment Utilized During Treatment: Gait belt Activity Tolerance: Patient limited by fatigue Patient left: in bed;with call bell/phone within reach;with bed alarm set Nurse Communication: Mobility status PT Visit Diagnosis: Unsteadiness on feet (R26.81);Other abnormalities of gait and mobility (R26.89);Muscle weakness (generalized) (M62.81)     Time: 6195-0932 PT Time Calculation (min) (ACUTE ONLY): 33 min  Charges:  $Gait Training: 8-22 mins $Therapeutic Activity: 8-22 mins                     Maggie Font, PT Acute Rehab Services Pager [phone] [facility] [phone] [facility] [phone]    Karlton Lemon 05/24/2019, 12:56 PM

## 2019-05-24 NOTE — Discharge Summary (Addendum)
Discharge Summary  Patient ID: Stephanie Combs MRN: [id number] DOB/AGE: October 25, 1950 69 y.o.  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)   Small cell lung cancer, left upper lobe (HCC)   Secondary malignant neoplasm of brain [facility])   Discharged Condition: fair  Discharge Labs:   CBC    Component Value Date/Time   WBC 5.6 05/22/2019 0540   RBC 3.14 (L) 05/22/2019 0540   HGB 9.3 (L) 05/22/2019 0540   HGB 9.3 (L) 03/09/2019 1233   HCT 29.9 (L) 05/22/2019 0540   HCT 31.0 (L) 02/01/2017 0528   PLT 226 05/22/2019 0540   PLT 211 03/09/2019 1233   MCV 95.2 05/22/2019 0540   MCH 29.6 05/22/2019 0540   MCHC 31.1 05/22/2019 0540   RDW 15.8 (H) 05/22/2019 0540   LYMPHSABS 0.6 (L) 05/19/2019 0342   MONOABS 0.6 05/19/2019 0342   EOSABS 0.1 05/19/2019 0342   BASOSABS 0.1 05/19/2019 0342   CMP Latest Ref Rng & Units 05/22/2019 05/21/2019 05/20/2019  Glucose 70 - 99 mg/dL 160(H) 130(H) 173(H)  BUN 8 - 23 mg/dL 17 18 23   Creatinine 0.44 - 1.00 mg/dL 0.91 1.02(H) 1.15(H)  Sodium 135 - 145 mmol/L 136 140 139  Potassium 3.5 - 5.1 mmol/L 3.9 3.7 4.2  Chloride 98 - 111 mmol/L 100 103 104  CO2 22 - 32 mmol/L 28 27 26   Calcium 8.9 - 10.3 mg/dL 8.2(L) 8.3(L) 8.4(L)  Total Protein 6.5 - 8.1 g/dL 5.0(L) 5.2(L) 5.2(L)  Total Bilirubin 0.3 - 1.2 mg/dL 0.5 0.5 0.7  Alkaline Phos 38 - 126 U/L 69 66 64  AST 15 - 41 U/L 14(L) 17 19  ALT 0 - 44 U/L 12 12 14     Significant Diagnostic Studies: radiology: CXR: 05/19/2019: Concern for patchy peripheral left upper lobe airspace opacity/pneumonia.  Consults: None  Procedures: None  Disposition: Discharge disposition: 03-Skilled Nursing Facility      Allergies as of 05/24/2019      Reactions   Dilaudid [hydromorphone Hcl] Itching   Midodrine Hcl Swelling   After one dose had anaphylactic reaction, had to  call EMS.   Actifed Cold-allergy [chlorpheniramine-phenylephrine]    "I was sick and red and it didn't agree with me at all"   Doxycycline Nausea And Vomiting   Other Cough   Pt states she is allergic to ragweed and that she starts coughing and sneezing like crazy   Penicillins Hives, Itching   Tolerates Rocephin Has patient had a PCN reaction causing immediate rash, facial/tongue/throat swelling, SOB or lightheadedness with hypotension: Yes Has patient had a PCN reaction causing severe rash involving mucus membranes or skin necrosis: No Has patient had a PCN reaction that required hospitalization Yes Has patient had a PCN reaction occurring within the last 10 years: No If all of the above answers are "NO", then may proceed with Cephalosporin use.   Reglan [metoclopramide] Itching   Valium [diazepam] Itching   Vistaril [hydroxyzine Hcl] Itching      Medication List    STOP taking these medications   diphenoxylate-atropine 2.5-0.025 MG tablet Commonly known as: LOMOTIL   oxyCODONE-acetaminophen 5-325 MG tablet Commonly known as: PERCOCET/ROXICET     TAKE these medications   albuterol (2.5 MG/3ML) 0.083% nebulizer solution Commonly known as: PROVENTIL Take 2.5 mg by nebulization 2 (two) times daily as needed for wheezing or shortness of  breath.   albuterol 108 (90 Base) MCG/ACT inhaler Commonly known as: ProAir HFA Inhale 2 puffs into the lungs every 4 (four) hours as needed for wheezing or shortness of breath. For shortness of breath   allopurinol 300 MG tablet Commonly known as: ZYLOPRIM Take 300 mg by mouth daily.   ALPRAZolam 1 MG tablet Commonly known as: XANAX Take 1 tablet (1 mg total) by mouth 3 (three) times daily as needed for anxiety or sleep.   aspirin EC 81 MG tablet Take 81 mg by mouth daily.   atorvastatin 80 MG tablet Commonly known as: LIPITOR Take 1 tablet (80 mg total) by mouth daily at 6 PM.   busPIRone 7.5 MG tablet Commonly known as:  BUSPAR Take 7.5 mg by mouth 2 (two) times daily.   cholecalciferol 25 MCG (1000 UNIT) tablet Commonly known as: VITAMIN D3 Take 5,000 Units by mouth daily.   diphenhydrAMINE-zinc acetate cream Commonly known as: BENADRYL Apply 1 application topically 3 (three) times daily as needed for itching. What changed: additional instructions   feeding supplement (ENSURE ENLIVE) Liqd Take 237 mLs by mouth 2 (two) times daily between meals.   feeding supplement (PRO-STAT SUGAR FREE 64) Liqd Take 30 mLs by mouth daily.   FISH OIL PO Take 1 capsule by mouth daily.   furosemide 40 MG tablet Commonly known as: LASIX Take 1 tablet (40 mg total) by mouth daily.   gabapentin 600 MG tablet Commonly known as: NEURONTIN Take 600 mg by mouth 3 (three) times daily.   Gerhardt's butt cream Crea Apply 1 application topically 4 (four) times daily as needed for irritation.   hydrocortisone 2.5 % rectal cream Commonly known as: ANUSOL-HC Place 1 application rectally 4 (four) times daily as needed for hemorrhoids.   insulin aspart 100 UNIT/ML injection Commonly known as: novoLOG Inject 0-9 Units into the skin See admin instructions. insulin aspart (novoLOG) injection 0-9 Units 0-9 Units,  Subcutaneous, 3 times daily with meals, CBG < 70: Implement Hypoglycemia Standing Orders and refer to Hypoglycemia Standing Orders sidebar report  CBG 70 - 120: 0 units  CBG 121 - 150: 1 unit  CBG 151 - 200: 2 units  CBG 201 - 250: 3 units  CBG 251 - 300: 5 units  CBG 301 - 350: 7 units  CBG 351 - 400: 9 units CBG > 400:   insulin detemir 100 UNIT/ML injection Commonly known as: Levemir Inject 0.34 mLs (34 Units total) into the skin at bedtime. What changed: how much to take   levothyroxine 200 MCG tablet Commonly known as: SYNTHROID Take 200 mcg by mouth daily before breakfast.   multivitamin with minerals Tabs tablet Take 1 tablet by mouth daily. Start taking on: May 25, 2019   nitroGLYCERIN  0.4 MG SL tablet Commonly known as: NITROSTAT Place 0.4 mg under the tongue every 5 (five) minutes as needed for chest pain.   nystatin powder Commonly known as: MYCOSTATIN/NYSTOP Apply topically 2 (two) times daily.   ondansetron 8 MG tablet Commonly known as: ZOFRAN Take 1 tablet (8 mg total) by mouth every 8 (eight) hours as needed for nausea or vomiting.   oxyCODONE 5 MG immediate release tablet Commonly known as: Oxy IR/ROXICODONE Take 1-2 tablets (5-10 mg total) by mouth every 6 (six) hours as needed for severe pain.   OXYGEN Inhale 2 L into the lungs continuous.   pantoprazole 40 MG tablet Commonly known as: PROTONIX Take 1 tablet (40 mg total) by mouth 2 (two) times daily.  polyethylene glycol 17 g packet Commonly known as: MIRALAX / GLYCOLAX Take 17 g by mouth 2 (two) times daily. What changed:   when to take this  reasons to take this   potassium chloride SA 20 MEQ tablet Commonly known as: KLOR-CON Take 1 tablet (20 mEq total) by mouth daily.   promethazine 12.5 MG tablet Commonly known as: PHENERGAN Take 1 tablet (12.5 mg total) by mouth every 6 (six) hours as needed for nausea or vomiting.   senna 8.6 MG Tabs tablet Commonly known as: SENOKOT Take 2 tablets (17.2 mg total) by mouth 2 (two) times daily. What changed:   when to take this  reasons to take this   silver sulfADIAZINE 1 % cream Commonly known as: SILVADENE Apply 1 application topically 2 (two) times daily.   simethicone 80 MG chewable tablet Commonly known as: MYLICON Chew 1 tablet (80 mg total) by mouth 3 (three) times daily as needed for flatulence.   sucralfate 1 g tablet Commonly known as: Carafate Take 1 tablet (1 g total) by mouth 4 (four) times daily -  with meals and at bedtime. 5 min before meals for radiation induced esophagitis   SURE COMFORT INS SYR 1CC/28G 28G X 1/2" 1 ML Misc Generic drug: INS SYRINGE/NEEDLE 1CC/28G USE TO INJECT INSULIN UP TO 4 TIMES DAILY.    tiZANidine 4 MG tablet Commonly known as: Zanaflex Take 1 tablet (4 mg total) by mouth every 8 (eight) hours as needed for muscle spasms.   topiramate 25 MG tablet Commonly known as: TOPAMAX Take 75 mg by mouth at bedtime.   umeclidinium-vilanterol 62.5-25 MCG/INH Aepb Commonly known as: ANORO ELLIPTA Inhale 1 puff into the lungs daily.       Contact information for after-discharge care    [facility] .   Service: Skilled Nursing Contact information: [address] [place] [phone]              HPI:  Stephanie Combs is a 69 year old female who is well-known to our practice and has a diagnosis of small cell lung cancer with brain metastases and colon cancer.  The patient has not received any recent chemotherapy and is on observation.  He presented to the emergency room with increased weakness, fatigue, and anorexia.  She was noted to have a temperature of 100.2 on admission.  CBC showed a hemoglobin of 7.1, glucose 233, BUN 31, creatinine 1.55, calcium 8.7, albumin 3.3.  COVID-19 testing on admission was negative.  She reports shortness of breath but is not having any cough.  She has not noticed any recent bleeding.  She reports diffuse pain as well as nausea.  She is not currently having any vomiting.  She is not reporting any headaches or vision changes.  Denies chest discomfort.  No constipation or diarrhea reported.  She also reports ongoing lower extremity edema which has not really responded to Lasix. The patient was admitted for treatment of her anemia and failure to thrive.  Hospital Course:  Anemia: The patient had symptomatic anemia on admission.  She received a total of 3 units of PRBCs during her admission.  Hemoglobin improved to 9.3 on 05/22/2019.   Failure to thrive: This is likely due to her underlying cancer.  The plan was initially to send her home with hospice, but the family was unable to take  care of her at home.  She is planning to go to a skilled nursing facility for rehabilitation.  Palliative care will continue to follow her in the facility.  Fever secondary to pneumonia, resolved: Chest x-ray indicated the patient had a left upper lobe pneumonia.  She was given 5 days of ceftriaxone and azithromycin with resolution of her fevers and improvement in her breathing.  Continue outpatient inhalers and nebulizers.  Small cell lung cancer with brain metastases: The patient is not a candidate for any additional chemotherapy.  Continue observation only.  Colorectal cancer, untreated: The patient is not a candidate for any additional chemotherapy.  Continue observation only.  CKD:  The patient's renal function improved during her hospitalization.  We will continue her home dose of Lasix.  Chronic lower extremity edema: This remained stable during her hospitalization.  She had no signs of cellulitis.  She will continue the Lasix for her edema.  Diabetes mellitus: The patient's CBGs were moderately well controlled with Levemir 34 units at bedtime and sliding scale insulin.  She will continue these as an outpatient.  Hypertension: Continue home medications  CHF: Continue home medications  CAD: Tinea home medications  Rheumatoid arthritis and chronic back pain: The patient will continue on oxycodone 5 to 10 mg every 6 hours as needed for pain.  Depression anxiety: Continue BuSpar and Xanax.  Goals of care discussion: The patient is not a candidate for any additional chemotherapy.  We have recommended hospice which she agreed with.  However, the patient and her family would like her to go to a skilled facility for rehabilitation.  Additionally, there are issues with taking care of her in the home.  Palliative care will continue to follow her in the facility.  Would recommend referral to hospice once rehabilitation is complete.  The patient is a DNR/DNI.  Discharge Instructions     Complete patient signature process for consent form   Complete by: As directed    Diet Carb Modified   Complete by: As directed    Increase activity slowly   Complete by: As directed    Practitioner attestation of consent   Complete by: As directed    I, the ordering practitioner, attest that I have discussed with the patient the benefits, risks, side effects, alternatives, likelihood of achieving goals and potential problems during recovery for the procedure listed.   Procedure: Blood Product(s)      Signed: Mikey Bussing 05/24/2019, 10:29 AM   Addendum  Kyleah has completed 5 days antibiotics course yesterday and will be discharged to SNF today. She has revoked hospice. I will leave her f/u open for now, and she will call me when she is released from nursing home. Will continue supportive care.   Truitt Merle  05/24/2019

## 2019-05-25 DIAGNOSIS — E10628 Type 1 diabetes mellitus with other skin complications: Secondary | ICD-10-CM | POA: Diagnosis not present

## 2019-05-25 DIAGNOSIS — E10622 Type 1 diabetes mellitus with other skin ulcer: Secondary | ICD-10-CM | POA: Diagnosis not present

## 2019-05-25 DIAGNOSIS — L89303 Pressure ulcer of unspecified buttock, stage 3: Secondary | ICD-10-CM | POA: Diagnosis not present

## 2019-05-26 DIAGNOSIS — R627 Adult failure to thrive: Secondary | ICD-10-CM | POA: Diagnosis not present

## 2019-05-26 DIAGNOSIS — C3490 Malignant neoplasm of unspecified part of unspecified bronchus or lung: Secondary | ICD-10-CM | POA: Diagnosis not present

## 2019-05-26 DIAGNOSIS — J189 Pneumonia, unspecified organism: Secondary | ICD-10-CM | POA: Diagnosis not present

## 2019-05-26 DIAGNOSIS — D649 Anemia, unspecified: Secondary | ICD-10-CM | POA: Diagnosis not present

## 2019-05-27 ENCOUNTER — Telehealth: Payer: Self-pay

## 2019-05-27 ENCOUNTER — Inpatient Hospital Stay: Payer: Medicare Other | Admitting: Hematology

## 2019-05-27 ENCOUNTER — Inpatient Hospital Stay: Payer: Medicare Other

## 2019-05-27 NOTE — Telephone Encounter (Signed)
Faxed signed order back to [facility] stating patient went to SNF and revoked hospice benefits at this time. Sent to HIM for scan to chart.

## 2019-05-30 ENCOUNTER — Other Ambulatory Visit: Payer: Self-pay | Admitting: Hematology

## 2019-05-31 ENCOUNTER — Other Ambulatory Visit: Payer: Self-pay | Admitting: Nurse Practitioner

## 2019-05-31 DIAGNOSIS — C3490 Malignant neoplasm of unspecified part of unspecified bronchus or lung: Secondary | ICD-10-CM | POA: Diagnosis not present

## 2019-05-31 DIAGNOSIS — C19 Malignant neoplasm of rectosigmoid junction: Secondary | ICD-10-CM | POA: Diagnosis not present

## 2019-05-31 DIAGNOSIS — D649 Anemia, unspecified: Secondary | ICD-10-CM | POA: Diagnosis not present

## 2019-05-31 MED ORDER — OXYCODONE HCL 5 MG PO TABS: 5.0000 mg | ORAL_TABLET | Freq: Four times a day (QID) | ORAL | 0 refills | Status: DC | PRN

## 2019-05-31 NOTE — Telephone Encounter (Signed)
#   30 on 05/24/19 by Erasmo Downer.

## 2019-06-01 DIAGNOSIS — M6281 Muscle weakness (generalized): Secondary | ICD-10-CM | POA: Diagnosis not present

## 2019-06-01 DIAGNOSIS — R7989 Other specified abnormal findings of blood chemistry: Secondary | ICD-10-CM | POA: Diagnosis not present

## 2019-06-01 DIAGNOSIS — Z79899 Other long term (current) drug therapy: Secondary | ICD-10-CM | POA: Diagnosis not present

## 2019-06-01 DIAGNOSIS — L89896 Pressure-induced deep tissue damage of other site: Secondary | ICD-10-CM | POA: Diagnosis not present

## 2019-06-01 DIAGNOSIS — L89303 Pressure ulcer of unspecified buttock, stage 3: Secondary | ICD-10-CM | POA: Diagnosis not present

## 2019-06-01 DIAGNOSIS — E10622 Type 1 diabetes mellitus with other skin ulcer: Secondary | ICD-10-CM | POA: Diagnosis not present

## 2019-06-01 DIAGNOSIS — D649 Anemia, unspecified: Secondary | ICD-10-CM | POA: Diagnosis not present

## 2019-06-01 DIAGNOSIS — E119 Type 2 diabetes mellitus without complications: Secondary | ICD-10-CM | POA: Diagnosis not present

## 2019-06-02 ENCOUNTER — Telehealth: Payer: Self-pay

## 2019-06-02 DIAGNOSIS — R600 Localized edema: Secondary | ICD-10-CM | POA: Diagnosis not present

## 2019-06-02 DIAGNOSIS — C3490 Malignant neoplasm of unspecified part of unspecified bronchus or lung: Secondary | ICD-10-CM | POA: Diagnosis not present

## 2019-06-02 DIAGNOSIS — L03119 Cellulitis of unspecified part of limb: Secondary | ICD-10-CM | POA: Diagnosis not present

## 2019-06-02 DIAGNOSIS — C7931 Secondary malignant neoplasm of brain: Secondary | ICD-10-CM | POA: Diagnosis not present

## 2019-06-02 NOTE — Telephone Encounter (Signed)
Faxed signed orders back to [facility] in [place] at [phone], received confirmation, sent to HIM for scan to chart.

## 2019-06-08 DIAGNOSIS — L89313 Pressure ulcer of right buttock, stage 3: Secondary | ICD-10-CM | POA: Diagnosis not present

## 2019-06-08 DIAGNOSIS — L89303 Pressure ulcer of unspecified buttock, stage 3: Secondary | ICD-10-CM | POA: Diagnosis not present

## 2019-06-08 DIAGNOSIS — L89896 Pressure-induced deep tissue damage of other site: Secondary | ICD-10-CM | POA: Diagnosis not present

## 2019-06-08 DIAGNOSIS — E10622 Type 1 diabetes mellitus with other skin ulcer: Secondary | ICD-10-CM | POA: Diagnosis not present

## 2019-06-09 ENCOUNTER — Observation Stay (HOSPITAL_COMMUNITY)
Admission: EM | Admit: 2019-06-09 | Discharge: 2019-06-11 | Disposition: A | Discharge status: Home / Self Care | Payer: No Typology Code available for payment source | Attending: Internal Medicine | Admitting: Internal Medicine

## 2019-06-09 DIAGNOSIS — J9611 Chronic respiratory failure with hypoxia: Secondary | ICD-10-CM | POA: Diagnosis not present

## 2019-06-09 DIAGNOSIS — D500 Iron deficiency anemia secondary to blood loss (chronic): Secondary | ICD-10-CM | POA: Insufficient documentation

## 2019-06-09 DIAGNOSIS — I25118 Atherosclerotic heart disease of native coronary artery with other forms of angina pectoris: Secondary | ICD-10-CM | POA: Insufficient documentation

## 2019-06-09 DIAGNOSIS — R0789 Other chest pain: Secondary | ICD-10-CM | POA: Diagnosis present

## 2019-06-09 DIAGNOSIS — R195 Other fecal abnormalities: Secondary | ICD-10-CM | POA: Diagnosis not present

## 2019-06-09 DIAGNOSIS — N179 Acute kidney failure, unspecified: Secondary | ICD-10-CM | POA: Diagnosis not present

## 2019-06-09 DIAGNOSIS — N183 Chronic kidney disease, stage 3 unspecified: Secondary | ICD-10-CM | POA: Diagnosis not present

## 2019-06-09 DIAGNOSIS — E119 Type 2 diabetes mellitus without complications: Secondary | ICD-10-CM

## 2019-06-09 DIAGNOSIS — C7931 Secondary malignant neoplasm of brain: Secondary | ICD-10-CM | POA: Diagnosis not present

## 2019-06-09 DIAGNOSIS — Z79899 Other long term (current) drug therapy: Secondary | ICD-10-CM | POA: Diagnosis not present

## 2019-06-09 DIAGNOSIS — I5032 Chronic diastolic (congestive) heart failure: Secondary | ICD-10-CM | POA: Diagnosis not present

## 2019-06-09 DIAGNOSIS — I130 Hypertensive heart and chronic kidney disease with heart failure and stage 1 through stage 4 chronic kidney disease, or unspecified chronic kidney disease: Secondary | ICD-10-CM | POA: Diagnosis not present

## 2019-06-09 DIAGNOSIS — C3412 Malignant neoplasm of upper lobe, left bronchus or lung: Secondary | ICD-10-CM | POA: Diagnosis not present

## 2019-06-09 DIAGNOSIS — R079 Chest pain, unspecified: Secondary | ICD-10-CM

## 2019-06-09 DIAGNOSIS — D649 Anemia, unspecified: Secondary | ICD-10-CM | POA: Diagnosis present

## 2019-06-09 DIAGNOSIS — F1120 Opioid dependence, uncomplicated: Secondary | ICD-10-CM | POA: Insufficient documentation

## 2019-06-09 DIAGNOSIS — I10 Essential (primary) hypertension: Secondary | ICD-10-CM | POA: Diagnosis present

## 2019-06-09 DIAGNOSIS — Z87891 Personal history of nicotine dependence: Secondary | ICD-10-CM | POA: Insufficient documentation

## 2019-06-09 DIAGNOSIS — Z20822 Contact with and (suspected) exposure to covid-19: Secondary | ICD-10-CM | POA: Diagnosis not present

## 2019-06-09 DIAGNOSIS — E1122 Type 2 diabetes mellitus with diabetic chronic kidney disease: Secondary | ICD-10-CM | POA: Diagnosis not present

## 2019-06-09 DIAGNOSIS — F419 Anxiety disorder, unspecified: Secondary | ICD-10-CM | POA: Insufficient documentation

## 2019-06-09 DIAGNOSIS — J449 Chronic obstructive pulmonary disease, unspecified: Secondary | ICD-10-CM | POA: Diagnosis not present

## 2019-06-09 DIAGNOSIS — C3490 Malignant neoplasm of unspecified part of unspecified bronchus or lung: Secondary | ICD-10-CM | POA: Diagnosis not present

## 2019-06-09 DIAGNOSIS — Z794 Long term (current) use of insulin: Secondary | ICD-10-CM | POA: Diagnosis not present

## 2019-06-09 DIAGNOSIS — K922 Gastrointestinal hemorrhage, unspecified: Secondary | ICD-10-CM | POA: Insufficient documentation

## 2019-06-09 DIAGNOSIS — E039 Hypothyroidism, unspecified: Secondary | ICD-10-CM | POA: Insufficient documentation

## 2019-06-10 ENCOUNTER — Other Ambulatory Visit: Payer: Self-pay

## 2019-06-10 ENCOUNTER — Emergency Department (HOSPITAL_COMMUNITY): Payer: No Typology Code available for payment source

## 2019-06-10 ENCOUNTER — Encounter (HOSPITAL_COMMUNITY): Payer: Self-pay

## 2019-06-10 DIAGNOSIS — K922 Gastrointestinal hemorrhage, unspecified: Secondary | ICD-10-CM | POA: Diagnosis present

## 2019-06-10 DIAGNOSIS — D500 Iron deficiency anemia secondary to blood loss (chronic): Secondary | ICD-10-CM

## 2019-06-10 DIAGNOSIS — R079 Chest pain, unspecified: Secondary | ICD-10-CM | POA: Diagnosis not present

## 2019-06-10 LAB — GLUCOSE, CAPILLARY
Glucose-Capillary: 139 mg/dL — ABNORMAL HIGH (ref 70–99)
Glucose-Capillary: 225 mg/dL — ABNORMAL HIGH (ref 70–99)

## 2019-06-10 LAB — BASIC METABOLIC PANEL
Anion gap: 8 (ref 5–15)
Anion gap: 9 (ref 5–15)
BUN: 36 mg/dL — ABNORMAL HIGH (ref 8–23)
BUN: 37 mg/dL — ABNORMAL HIGH (ref 8–23)
CO2: 26 mmol/L (ref 22–32)
CO2: 25 mmol/L (ref 22–32)
Calcium: 8.5 mg/dL — ABNORMAL LOW (ref 8.9–10.3)
Calcium: 8.5 mg/dL — ABNORMAL LOW (ref 8.9–10.3)
Chloride: 101 mmol/L (ref 98–111)
Chloride: 100 mmol/L (ref 98–111)
Creatinine, Ser: 1.78 mg/dL — ABNORMAL HIGH (ref 0.44–1.00)
Creatinine, Ser: 1.78 mg/dL — ABNORMAL HIGH (ref 0.44–1.00)
GFR calc Af Amer: 33 mL/min — ABNORMAL LOW (ref >60)
GFR calc Af Amer: 33 mL/min — ABNORMAL LOW (ref >60)
GFR calc non Af Amer: 29 mL/min — ABNORMAL LOW (ref >60)
GFR calc non Af Amer: 29 mL/min — ABNORMAL LOW (ref >60)
Glucose, Bld: 214 mg/dL — ABNORMAL HIGH (ref 70–99)
Glucose, Bld: 182 mg/dL — ABNORMAL HIGH (ref 70–99)
Potassium: 3.9 mmol/L (ref 3.5–5.1)
Potassium: 3.9 mmol/L (ref 3.5–5.1)
Sodium: 135 mmol/L (ref 135–145)
Sodium: 134 mmol/L — ABNORMAL LOW (ref 135–145)

## 2019-06-10 LAB — CBC
HCT: 15.1 % — ABNORMAL LOW (ref 36.0–46.0)
Hemoglobin: 4.4 g/dL — CL (ref 12.0–15.0)
MCH: 30.1 pg (ref 26.0–34.0)
MCHC: 29.1 g/dL — ABNORMAL LOW (ref 30.0–36.0)
MCV: 103.4 fL — ABNORMAL HIGH (ref 80.0–100.0)
Platelets: 257 10*3/uL (ref 150–400)
RBC: 1.46 MIL/uL — ABNORMAL LOW (ref 3.87–5.11)
RDW: 17.5 % — ABNORMAL HIGH (ref 11.5–15.5)
WBC: 6.4 10*3/uL (ref 4.0–10.5)
nRBC: 0 % (ref 0.0–0.2)

## 2019-06-10 LAB — CBC WITH DIFFERENTIAL/PLATELET
Abs Immature Granulocytes: 0.02 10*3/uL (ref 0.00–0.07)
Basophils Absolute: 0 10*3/uL (ref 0.0–0.1)
Basophils Relative: 0 %
Eosinophils Absolute: 0.2 10*3/uL (ref 0.0–0.5)
Eosinophils Relative: 4 %
HCT: 14 % — ABNORMAL LOW (ref 36.0–46.0)
Hemoglobin: 4.1 g/dL — CL (ref 12.0–15.0)
Immature Granulocytes: 0 %
Lymphocytes Relative: 13 %
Lymphs Abs: 0.7 10*3/uL (ref 0.7–4.0)
MCH: 30.4 pg (ref 26.0–34.0)
MCHC: 29.3 g/dL — ABNORMAL LOW (ref 30.0–36.0)
MCV: 103.7 fL — ABNORMAL HIGH (ref 80.0–100.0)
Monocytes Absolute: 0.4 10*3/uL (ref 0.1–1.0)
Monocytes Relative: 8 %
Neutro Abs: 4.2 10*3/uL (ref 1.7–7.7)
Neutrophils Relative %: 75 %
Platelets: 250 10*3/uL (ref 150–400)
RBC: 1.35 MIL/uL — ABNORMAL LOW (ref 3.87–5.11)
RDW: 17.7 % — ABNORMAL HIGH (ref 11.5–15.5)
WBC: 5.6 10*3/uL (ref 4.0–10.5)
nRBC: 0 % (ref 0.0–0.2)

## 2019-06-10 LAB — CBG MONITORING, ED
Glucose-Capillary: 154 mg/dL — ABNORMAL HIGH (ref 70–99)
Glucose-Capillary: 134 mg/dL — ABNORMAL HIGH (ref 70–99)

## 2019-06-10 LAB — TROPONIN I (HIGH SENSITIVITY)
Troponin I (High Sensitivity): 10 ng/L (ref <18)
Troponin I (High Sensitivity): 62 ng/L — ABNORMAL HIGH (ref <18)

## 2019-06-10 LAB — POC OCCULT BLOOD, ED: Fecal Occult Bld: POSITIVE — AB

## 2019-06-10 LAB — RESPIRATORY PANEL BY RT PCR (FLU A&B, COVID)
Influenza A by PCR: NEGATIVE
Influenza B by PCR: NEGATIVE
SARS Coronavirus 2 by RT PCR: NEGATIVE

## 2019-06-10 LAB — PREPARE RBC (CROSSMATCH)

## 2019-06-10 MED ORDER — ALPRAZOLAM 1 MG PO TABS
1.0000 mg | ORAL_TABLET | Freq: Three times a day (TID) | ORAL | Status: DC | PRN
  Administered 2019-06-10 – 2019-06-11 (×2): 1 mg via ORAL
  Filled 2019-06-10 (×2): qty 1

## 2019-06-10 MED ORDER — SENNA 8.6 MG PO TABS
2.0000 | ORAL_TABLET | Freq: Two times a day (BID) | ORAL | Status: DC
  Administered 2019-06-10 – 2019-06-11 (×2): 17.2 mg via ORAL
  Filled 2019-06-10 (×9): qty 2

## 2019-06-10 MED ORDER — ALBUTEROL SULFATE HFA 108 (90 BASE) MCG/ACT IN AERS: 2.0000 | INHALATION_SPRAY | RESPIRATORY_TRACT | Status: DC | PRN

## 2019-06-10 MED ORDER — SODIUM CHLORIDE 0.9% IV SOLUTION: Freq: Once | INTRAVENOUS | Status: AC

## 2019-06-10 MED ORDER — UMECLIDINIUM-VILANTEROL 62.5-25 MCG/INH IN AEPB: 1.0000 | INHALATION_SPRAY | Freq: Every day | RESPIRATORY_TRACT | Status: DC

## 2019-06-10 MED ORDER — TIZANIDINE HCL 4 MG PO TABS
4.0000 mg | ORAL_TABLET | Freq: Three times a day (TID) | ORAL | Status: DC | PRN
  Administered 2019-06-11: 10:00:00 4 mg via ORAL
  Filled 2019-06-10 (×2): qty 1

## 2019-06-10 MED ORDER — SODIUM CHLORIDE 0.9% FLUSH: 3.0000 mL | INTRAVENOUS | Status: DC | PRN

## 2019-06-10 MED ORDER — SILVER SULFADIAZINE 1 % EX CREA
1.0000 "application " | TOPICAL_CREAM | Freq: Two times a day (BID) | CUTANEOUS | Status: DC
  Administered 2019-06-10: 1 via TOPICAL
  Filled 2019-06-10: qty 85

## 2019-06-10 MED ORDER — SODIUM CHLORIDE 0.9% FLUSH
3.0000 mL | Freq: Two times a day (BID) | INTRAVENOUS | Status: DC
  Administered 2019-06-10 – 2019-06-11 (×3): 3 mL via INTRAVENOUS

## 2019-06-10 MED ORDER — ACETAMINOPHEN 650 MG RE SUPP: 650.0000 mg | Freq: Four times a day (QID) | RECTAL | Status: DC | PRN

## 2019-06-10 MED ORDER — GABAPENTIN 300 MG PO CAPS
600.0000 mg | ORAL_CAPSULE | Freq: Three times a day (TID) | ORAL | Status: DC
  Administered 2019-06-10 – 2019-06-11 (×4): 600 mg via ORAL
  Filled 2019-06-10 (×4): qty 2

## 2019-06-10 MED ORDER — ALLOPURINOL 300 MG PO TABS
300.0000 mg | ORAL_TABLET | Freq: Every day | ORAL | Status: DC
  Administered 2019-06-10 – 2019-06-11 (×2): 300 mg via ORAL
  Filled 2019-06-10 (×6): qty 1

## 2019-06-10 MED ORDER — OMEGA-3-ACID ETHYL ESTERS 1 G PO CAPS
1.0000 g | ORAL_CAPSULE | Freq: Every day | ORAL | Status: DC
  Administered 2019-06-10 – 2019-06-11 (×2): 1 g via ORAL
  Filled 2019-06-10 (×6): qty 1

## 2019-06-10 MED ORDER — ACETAMINOPHEN 325 MG PO TABS
650.0000 mg | ORAL_TABLET | Freq: Four times a day (QID) | ORAL | Status: DC | PRN
  Filled 2019-06-10: qty 2

## 2019-06-10 MED ORDER — SIMETHICONE 80 MG PO CHEW: 80.0000 mg | CHEWABLE_TABLET | Freq: Three times a day (TID) | ORAL | Status: DC | PRN

## 2019-06-10 MED ORDER — SUCRALFATE 1 G PO TABS
1.0000 g | ORAL_TABLET | Freq: Three times a day (TID) | ORAL | Status: DC
  Administered 2019-06-10 – 2019-06-11 (×4): 1 g via ORAL
  Filled 2019-06-10 (×4): qty 1

## 2019-06-10 MED ORDER — TOPIRAMATE 25 MG PO TABS
75.0000 mg | ORAL_TABLET | Freq: Every day | ORAL | Status: DC
  Administered 2019-06-10: 22:00:00 75 mg via ORAL
  Filled 2019-06-10: qty 3

## 2019-06-10 MED ORDER — INSULIN ASPART 100 UNIT/ML ~~LOC~~ SOLN
0.0000 [IU] | Freq: Four times a day (QID) | SUBCUTANEOUS | Status: DC
  Administered 2019-06-10: 06:00:00 1 [IU] via SUBCUTANEOUS
  Administered 2019-06-11 (×2): 2 [IU] via SUBCUTANEOUS
  Filled 2019-06-10: qty 1

## 2019-06-10 MED ORDER — VITAMIN D 25 MCG (1000 UNIT) PO TABS
5000.0000 [IU] | ORAL_TABLET | Freq: Every day | ORAL | Status: DC
  Administered 2019-06-10 – 2019-06-11 (×2): 5000 [IU] via ORAL
  Filled 2019-06-10 (×2): qty 5

## 2019-06-10 MED ORDER — HYDROCORTISONE (PERIANAL) 2.5 % EX CREA
1.0000 "application " | TOPICAL_CREAM | Freq: Four times a day (QID) | CUTANEOUS | Status: DC | PRN
  Filled 2019-06-10: qty 28.35

## 2019-06-10 MED ORDER — UMECLIDINIUM-VILANTEROL 62.5-25 MCG/INH IN AEPB
INHALATION_SPRAY | RESPIRATORY_TRACT | Status: AC
  Administered 2019-06-10: 1 via RESPIRATORY_TRACT
  Filled 2019-06-10: qty 14

## 2019-06-10 MED ORDER — ALBUTEROL SULFATE (2.5 MG/3ML) 0.083% IN NEBU
2.5000 mg | INHALATION_SOLUTION | RESPIRATORY_TRACT | Status: DC | PRN
  Administered 2019-06-11: 08:00:00 2.5 mg via RESPIRATORY_TRACT
  Filled 2019-06-10: qty 3

## 2019-06-10 MED ORDER — ADULT MULTIVITAMIN W/MINERALS CH
1.0000 | ORAL_TABLET | Freq: Every day | ORAL | Status: DC
  Administered 2019-06-10 – 2019-06-11 (×2): 1 via ORAL
  Filled 2019-06-10 (×3): qty 1

## 2019-06-10 MED ORDER — LEVOTHYROXINE SODIUM 100 MCG PO TABS
200.0000 ug | ORAL_TABLET | Freq: Every day | ORAL | Status: DC
  Administered 2019-06-10 – 2019-06-11 (×2): 200 ug via ORAL
  Filled 2019-06-10: qty 2
  Filled 2019-06-10: qty 4
  Filled 2019-06-10: qty 2

## 2019-06-10 MED ORDER — POLYETHYLENE GLYCOL 3350 17 G PO PACK: 17.0000 g | PACK | Freq: Every day | ORAL | Status: DC | PRN

## 2019-06-10 MED ORDER — ENSURE ENLIVE PO LIQD
237.0000 mL | Freq: Two times a day (BID) | ORAL | Status: DC
  Filled 2019-06-10 (×8): qty 237

## 2019-06-10 MED ORDER — POTASSIUM CHLORIDE CRYS ER 20 MEQ PO TBCR
20.0000 meq | EXTENDED_RELEASE_TABLET | Freq: Every day | ORAL | Status: DC
  Administered 2019-06-10 – 2019-06-11 (×2): 20 meq via ORAL
  Filled 2019-06-10 (×3): qty 1

## 2019-06-10 MED ORDER — SODIUM CHLORIDE 0.9 % IV SOLN
10.0000 mL/h | Freq: Once | INTRAVENOUS | Status: AC
  Administered 2019-06-10: 02:00:00 10 mL/h via INTRAVENOUS

## 2019-06-10 MED ORDER — OXYCODONE HCL 5 MG PO TABS
5.0000 mg | ORAL_TABLET | ORAL | Status: DC | PRN
  Administered 2019-06-10 – 2019-06-11 (×2): 5 mg via ORAL
  Filled 2019-06-10 (×2): qty 1

## 2019-06-10 MED ORDER — BISACODYL 10 MG RE SUPP: 10.0000 mg | Freq: Every day | RECTAL | Status: DC | PRN

## 2019-06-10 MED ORDER — ATORVASTATIN CALCIUM 40 MG PO TABS
80.0000 mg | ORAL_TABLET | Freq: Every day | ORAL | Status: DC
  Administered 2019-06-10: 16:00:00 80 mg via ORAL
  Filled 2019-06-10: qty 2

## 2019-06-10 MED ORDER — ONDANSETRON HCL 4 MG/2ML IJ SOLN
4.0000 mg | Freq: Four times a day (QID) | INTRAMUSCULAR | Status: DC | PRN
  Filled 2019-06-10: qty 2

## 2019-06-10 MED ORDER — ONDANSETRON HCL 4 MG PO TABS: 4.0000 mg | ORAL_TABLET | Freq: Four times a day (QID) | ORAL | Status: DC | PRN

## 2019-06-10 MED ORDER — FUROSEMIDE 40 MG PO TABS
40.0000 mg | ORAL_TABLET | Freq: Every day | ORAL | Status: DC
  Administered 2019-06-10 – 2019-06-11 (×2): 40 mg via ORAL
  Filled 2019-06-10 (×3): qty 1

## 2019-06-10 MED ORDER — SODIUM CHLORIDE 0.9 % IV SOLN: 250.0000 mL | INTRAVENOUS | Status: DC | PRN

## 2019-06-10 MED ORDER — PRO-STAT SUGAR FREE PO LIQD
30.0000 mL | Freq: Every day | ORAL | Status: DC
  Filled 2019-06-10 (×6): qty 30

## 2019-06-10 MED ORDER — BUSPIRONE HCL 5 MG PO TABS
7.5000 mg | ORAL_TABLET | Freq: Two times a day (BID) | ORAL | Status: DC
  Administered 2019-06-10 – 2019-06-11 (×3): 7.5 mg via ORAL
  Filled 2019-06-10 (×3): qty 2

## 2019-06-10 NOTE — ED Notes (Signed)
Date and time results received: 06/10/19 0106  Test: Hgb Critical Value: 4.1  Name of Provider Notified: Roxanne Mins, MD

## 2019-06-10 NOTE — ED Triage Notes (Signed)
Pt arrived from [facility] c/o chest pain via [facility]. Pt was given NTG X 2 and 324mg  ASA prior to arrival in ED.Pt reports minimal relief from interventions. Chest pain came on suddenly and is on left chest.

## 2019-06-10 NOTE — ED Notes (Signed)
ED Provider at bedside. 

## 2019-06-10 NOTE — Progress Notes (Signed)
Per HPI: Stephanie Combs is a 69 y.o. female with medical history significant of coronary artery disease, diabetes, CHF and metastatic small cell lung cancerwith metsto the brain who was sent in from the nursing home because of chest pain.  Patient had relatively sudden onset chest pain while at rest radiating across her chest.  Was given nitroglycerin x2 as well as aspirin prior to arrival via EMS with no significant relief in symptoms.  Patient reported having slightly worsened shortness of breath from baseline as well as associated nausea.  No palpitations, fever, chills, cough, vomiting, diarrhea, abdominal pain.  No syncope, diaphoresis, seizures, focal deficits. ED Course:  Vital Signs reviewed on presentation, significant for temperature 99.1, heart rate 80, blood pressure 100/41, saturation 100% on 2 L nasal cannula. Labs reviewed, significant for sodium 135, potassium 3.9, BUN 36, creatinine 1.78, troponin X, WBC count 5.6, hemoglobin 4.1, hematocrit 14, platelets 250.  Fecal occult blood is positive. Imaging personally Reviewed, chest x-ray shows minimal left upper lobe opacity, improved from prior study EKG personally reviewed, shows sinus rhythm, no acute ST-T changes.  1/29: Patient was admitted with recurrent acute on chronic anemia in the setting of unresectable colon cancer as well as metastatic small cell lung cancer with mets to the brain.  She has been recommended palliative care and hospice in the future, but she currently declines these measures.  She was noted to have hemoglobin 4.1 and appears to have been ordered 3 units of PRBCs which are currently transfusing.  We will follow-up CBC and ensure that patient has improved and is near baseline prior to discharge.  She would like to follow-up with her oncologist soon and discuss further treatment options.  Total care time: 25 minutes.

## 2019-06-10 NOTE — H&P (Signed)
History and Physical    Patient Demographics:    MORENIKE CUFF XHB:[id number] DOB: 03-16-1951 DOA: 06/09/2019  PCP: [name], MD  Patient coming from: SNF  I have personally briefly reviewed patient's old medical records in [place]  Chief Complaint: chest pain   Assessment & Plan:     Assessment/Plan Active Problems:   COPD (chronic obstructive pulmonary disease) ([place])   Morbid obesity ([place])   DM type 2 (diabetes mellitus, type 2) ([place])   HTN (hypertension), benign   CKD (chronic kidney disease), stage III ([place])   Chronic diastolic CHF (congestive heart failure) (HCC)   Hypothyroidism   Anxiety   Chronic respiratory failure with hypoxia (HCC)   Opioid dependence ([place])   Heme positive stool   Small cell lung cancer, left upper lobe ([place])   Secondary malignant neoplasm of brain ([place])   Anemia due to GI blood loss     Principal Problem: Acute on chronic anemia likely due to GI bleed Worsening anemia likely due to GI blood loss since pt has known colon cancer, and fecal occult positive.  Hemoglobin is 4.1 on presentation.  Patient has had recurrent admissions for GI bleed and has had multiple blood transfusions in the past. -Conservative management due to history of unresectable colon cancer and terminal lung cancer. -We will transfuse as needed with a goal of around 9. -Patient will likely not be a candidate for any GI evaluation given history of terminal malignancy.  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 Burr Medico.Status post concurrent chemoradiation for small cell lung cancer, status post whole brain radiation. She is not a surgical candidatefor colon cancer. --Not a candidate for further treatment - Patient's oncologist Dr. Burr Medico recommended palliative care and hospice involvement as there are no good treatment options at this time.  -Patient is not ready to  transition to full comfort measures or hospice at this time.  DM2 on insulin -Sliding scale insulin coverage fingerstick monitoring -Hold long-acting insulin as patient is n.p.o.  BLE edema and anasarca --Lasix at home regimen, 60 mg in the morning and 40 mg at night.  Hypertension: Continue home medications  Chronic congestive heart failure preserved ejection fraction: Most recent echo in November 2020 shows EF of more than 75%, hyperdynamic function, mild LV hypertrophy, grade 1 diastolic dysfunction. Patient is euvolemic currently. -Continue Lasix -Monitor input, renal function, daily weights  CAD: Continue home medications  Chronic pain on chronic opioids with Neuropathy --continue home opioids --continue home gabapentin --continue home bowel regimen  Anxiety / Depression --continue home Xanax and Buspar  COPD, stable Chronic hypoxic respiratory failure on 2-3L at home  -continue home suppl O2 -continue home Anoro  Hypothyroidism: -Continue Synthyroid  Morbid obesity: BMI of 44.6.   DVT prophylaxis: SCDs Code Status:  Full code Family Communication: N/A  Disposition Plan: Admit as inpatient for acute on chronic anemia due to GI bleed for blood transfusion Consults called: N/A Admission status: Inpatient status    HPI:     HPI: Stephanie Combs is a 69 y.o. female with medical history significant of coronary artery disease, diabetes, CHF and metastatic small cell lung cancerwith metsto the brain who was sent in from the nursing home because of chest pain.  Patient had relatively sudden onset chest pain while at rest radiating across her chest.  Was given nitroglycerin x2 as well as aspirin prior to arrival via EMS with no  significant relief in symptoms.  Patient reported having slightly worsened shortness of breath from baseline as well as associated nausea.  No palpitations, fever, chills, cough, vomiting, diarrhea, abdominal pain.  No syncope,  diaphoresis, seizures, focal deficits. ED Course:  Vital Signs reviewed on presentation, significant for temperature 99.1, heart rate 80, blood pressure 100/41, saturation 100% on 2 L nasal cannula. Labs reviewed, significant for sodium 135, potassium 3.9, BUN 36, creatinine 1.78, troponin X, WBC count 5.6, hemoglobin 4.1, hematocrit 14, platelets 250.  Fecal occult blood is positive. Imaging personally Reviewed, chest x-ray shows minimal left upper lobe opacity, improved from prior study EKG personally reviewed, shows sinus rhythm, no acute ST-T changes.    Review of systems:    Review of Systems: As per HPI otherwise 10 point review of systems negative.  All other review of systems is negative except the ones noted above in the HPI.    Past Medical and Surgical History:  Reviewed by me  Past Medical History:  Diagnosis Date  . Anxiety   . CAD (coronary artery disease)    a. s/p DES to LAD and angioplasty to D1 in 05/2017  . Cancer ([place])   . CHF (congestive heart failure) (HCC)    Diastolic  . Chronic back pain   . Chronic pain   . COPD (chronic obstructive pulmonary disease) ([place])   . Degenerative disk disease   . Diabetes mellitus without complication ([place])   . History of medication noncompliance 11/2017   "the patient frequently self-adjusts medications without notifying her physicians"  . Hypertension   . Hypothyroidism   . On home O2    2.5 L N/C prn  . Pedal edema     Past Surgical History:  Procedure Laterality Date  . BIOPSY  09/09/2018   Procedure: BIOPSY;  Surgeon: Daneil Dolin, MD;  [facility];  Service: Endoscopy;;  gastric esophageal hepatic flexure colon  . COLONOSCOPY WITH PROPOFOL N/A 09/09/2018   hepatic flexure colon mass s/p biopsy, with polyps associated with tumor scattered all the way to the cecum not removed, multiple splenic flexure and rectal polyps removed, left-sided diverticulosis, ?right hemicolectomy. Hepatic flexure  adenocarcinoma and additional adenocarcinoma in small splenic flexure polyp. Concern for synchronous colon cancer. High risk for surgical resection.   . CORONARY STENT INTERVENTION N/A 05/22/2017   Procedure: CORONARY STENT INTERVENTION;  Surgeon: Martinique, Peter M, MD;  Location: [facility];  Service: Cardiovascular;  Laterality: N/A;  . ESOPHAGOGASTRODUODENOSCOPY (EGD) WITH PROPOFOL N/A 09/09/2018   abnormal distal esophagus suspicious for Barrett's s/p biopsy, erosive gastropathy, duodenal bulbar erosions. +Barrett's  . HERNIA REPAIR    . POLYPECTOMY  09/09/2018   Procedure: POLYPECTOMY;  Surgeon: Daneil Dolin, MD;  [facility];  Service: Endoscopy;;  colon  . RIGHT/LEFT HEART CATH AND CORONARY ANGIOGRAPHY N/A 05/19/2017   Procedure: RIGHT/LEFT HEART CATH AND CORONARY ANGIOGRAPHY;  Surgeon: Leonie Man, MD;  Location: [facility];  Service: Cardiovascular;  Laterality: N/A;  . TUBAL LIGATION    . VIDEO BRONCHOSCOPY WITH ENDOBRONCHIAL ULTRASOUND Left 09/14/2018   Procedure: VIDEO BRONCHOSCOPY WITH ENDOBRONCHIAL ULTRASOUND with biopsies;  Surgeon: Margaretha Seeds, MD;  Location: [facility];  Service: Thoracic;  Laterality: Left;     Social History:  Reviewed by me   reports that she quit smoking about 4 years ago. Her smoking use included cigarettes. She has a 67.50 pack-year smoking history. She has never used smokeless tobacco. She reports that she  does not drink alcohol or use drugs.  Allergies:    Allergies  Allergen Reactions  . Dilaudid [Hydromorphone Hcl] Itching  . Midodrine Hcl Swelling    After one dose had anaphylactic reaction, had to call EMS.  Marland Kitchen Actifed Cold-Allergy [Chlorpheniramine-Phenylephrine]     "I was sick and red and it didn't agree with me at all"  . Doxycycline Nausea And Vomiting  . Other Cough    Pt states she is allergic to ragweed and that she starts coughing and sneezing like crazy  . Penicillins Hives and Itching    Tolerates  Rocephin Has patient had a PCN reaction causing immediate rash, facial/tongue/throat swelling, SOB or lightheadedness with hypotension: Yes Has patient had a PCN reaction causing severe rash involving mucus membranes or skin necrosis: No Has patient had a PCN reaction that required hospitalization Yes Has patient had a PCN reaction occurring within the last 10 years: No If all of the above answers are "NO", then may proceed with Cephalosporin use.   . Reglan [Metoclopramide] Itching  . Valium [Diazepam] Itching  . Vistaril [Hydroxyzine Hcl] Itching    Family History :   Family History  Problem Relation Age of Onset  . Stroke Mother        deceased 85   . Heart attack Father        deceased 70   . Diabetes Brother   . Cerebral palsy Brother   . Pneumonia Brother   . Diabetes Other   . Heart attack Other   . Colon cancer Neg Hx    Family history reviewed, noted as above, not pertinent to current presentation.   Home Medications:    Prior to Admission medications   Medication Sig Start Date End Date Taking? Authorizing Provider  albuterol (PROAIR HFA) 108 (90 Base) MCG/ACT inhaler Inhale 2 puffs into the lungs every 4 (four) hours as needed for wheezing or shortness of breath. For shortness of breath 09/22/18   Eugenie Filler, MD  albuterol (PROVENTIL) (2.5 MG/3ML) 0.083% nebulizer solution Take 2.5 mg by nebulization 2 (two) times daily as needed for wheezing or shortness of breath.     [provider]  allopurinol (ZYLOPRIM) 300 MG tablet Take 300 mg by mouth daily.    [provider]  ALPRAZolam Duanne Moron) 1 MG tablet Take 1 tablet (1 mg total) by mouth 3 (three) times daily as needed for anxiety or sleep. 05/24/19   Maryanna Shape, NP  Amino Acids-Protein Hydrolys (FEEDING SUPPLEMENT, PRO-STAT SUGAR FREE 64,) LIQD Take 30 mLs by mouth daily. 05/24/19   Maryanna Shape, NP  aspirin EC 81 MG tablet Take 81 mg by mouth daily.    [provider]    atorvastatin (LIPITOR) 80 MG tablet Take 1 tablet (80 mg total) by mouth daily at 6 PM. 05/26/17   [name], MD  busPIRone (BUSPAR) 7.5 MG tablet Take 7.5 mg by mouth 2 (two) times daily. 03/10/19   [provider]  cholecalciferol (VITAMIN D3) 25 MCG (1000 UT) tablet Take 5,000 Units by mouth daily.    [provider]  diphenhydrAMINE-zinc acetate (BENADRYL) cream Apply 1 application topically 3 (three) times daily as needed for itching. Patient taking differently: Apply 1 application topically 3 (three) times daily as needed for itching. Patient uses on her head for dry scalp. 03/09/19   Truitt Merle, MD  feeding supplement, ENSURE ENLIVE, (ENSURE ENLIVE) LIQD Take 237 mLs by mouth 2 (two) times daily between meals. 05/24/19  Maryanna Shape, NP  furosemide (LASIX) 40 MG tablet Take 1 tablet (40 mg total) by mouth daily. 04/28/19   Roxan Hockey, MD  gabapentin (NEURONTIN) 600 MG tablet Take 600 mg by mouth 3 (three) times daily.     [provider]  hydrocortisone (ANUSOL-HC) 2.5 % rectal cream Place 1 application rectally 4 (four) times daily as needed for hemorrhoids.  01/03/19   [provider]  Hydrocortisone (GERHARDT'S BUTT CREAM) CREA Apply 1 application topically 4 (four) times daily as needed for irritation. 12/16/18   Truitt Merle, MD  insulin aspart (NOVOLOG) 100 UNIT/ML injection Inject 0-9 Units into the skin See admin instructions. insulin aspart (novoLOG) injection 0-9 Units 0-9 Units,  Subcutaneous, 3 times daily with meals, CBG < 70: Implement Hypoglycemia Standing Orders and refer to Hypoglycemia Standing Orders sidebar report  CBG 70 - 120: 0 units  CBG 121 - 150: 1 unit  CBG 151 - 200: 2 units  CBG 201 - 250: 3 units  CBG 251 - 300: 5 units  CBG 301 - 350: 7 units  CBG 351 - 400: 9 units CBG > 400: 04/28/19   Emokpae, Courage, MD  insulin detemir (LEVEMIR) 100 UNIT/ML injection Inject 0.34 mLs (34 Units total) into the skin at bedtime.  05/24/19   Maryanna Shape, NP  levothyroxine (SYNTHROID, LEVOTHROID) 200 MCG tablet Take 200 mcg by mouth daily before breakfast.  06/24/18   [provider]  Multiple Vitamin (MULTIVITAMIN WITH MINERALS) TABS tablet Take 1 tablet by mouth daily. 05/25/19   Maryanna Shape, NP  nitroGLYCERIN (NITROSTAT) 0.4 MG SL tablet Place 0.4 mg under the tongue every 5 (five) minutes as needed for chest pain.  03/26/18   [provider]  nystatin (MYCOSTATIN/NYSTOP) powder Apply topically 2 (two) times daily. 02/22/19   Alla Feeling, NP  Omega-3 Fatty Acids (FISH OIL PO) Take 1 capsule by mouth daily.     [provider]  ondansetron (ZOFRAN) 8 MG tablet Take 1 tablet (8 mg total) by mouth every 8 (eight) hours as needed for nausea or vomiting. 04/28/19   Denton Brick, Courage, MD  oxyCODONE (OXY IR/ROXICODONE) 5 MG immediate release tablet Take 1-2 tablets (5-10 mg total) by mouth every 6 (six) hours as needed for severe pain. 05/31/19   Alla Feeling, NP  OXYGEN Inhale 2 L into the lungs continuous.    [provider]  pantoprazole (PROTONIX) 40 MG tablet Take 1 tablet (40 mg total) by mouth 2 (two) times daily. 10/07/18   Truitt Merle, MD  polyethylene glycol (MIRALAX / GLYCOLAX) 17 g packet Take 17 g by mouth 2 (two) times daily. Patient taking differently: Take 17 g by mouth daily as needed for moderate constipation.  04/14/19   Shelly Coss, MD  potassium chloride SA (KLOR-CON) 20 MEQ tablet Take 1 tablet (20 mEq total) by mouth daily. 04/28/19   Roxan Hockey, MD  promethazine (PHENERGAN) 12.5 MG tablet Take 1 tablet (12.5 mg total) by mouth every 6 (six) hours as needed for nausea or vomiting. 02/22/19   Alla Feeling, NP  senna (SENOKOT) 8.6 MG TABS tablet Take 2 tablets (17.2 mg total) by mouth 2 (two) times daily. Patient taking differently: Take 2 tablets by mouth 2 (two) times daily as needed for moderate constipation.  04/28/19   Roxan Hockey, MD  silver  sulfADIAZINE (SILVADENE) 1 % cream Apply 1 application topically 2 (two) times daily.  01/03/19   [provider]  simethicone Gregary Cromer)  80 MG chewable tablet Chew 1 tablet (80 mg total) by mouth 3 (three) times daily as needed for flatulence. 05/24/19   Maryanna Shape, NP  sucralfate (CARAFATE) 1 g tablet Take 1 tablet (1 g total) by mouth 4 (four) times daily -  with meals and at bedtime. 5 min before meals for radiation induced esophagitis 12/03/18   Tyler Pita, MD  SURE COMFORT INS SYR 1CC/28G 28G X 1/2" 1 ML MISC USE TO INJECT INSULIN UP TO 4 TIMES DAILY. 03/06/17   Cassandria Anger, MD  tiZANidine (ZANAFLEX) 4 MG tablet Take 1 tablet (4 mg total) by mouth every 8 (eight) hours as needed for muscle spasms. 05/10/19   Truitt Merle, MD  topiramate (TOPAMAX) 25 MG tablet Take 75 mg by mouth at bedtime.  03/18/18   [provider]  umeclidinium-vilanterol (ANORO ELLIPTA) 62.5-25 MCG/INH AEPB Inhale 1 puff into the lungs daily. 05/26/17   [name], MD    Physical Exam:    Physical Exam: Vitals:   06/10/19 0005 06/10/19 0008 06/10/19 0030 06/10/19 0100  BP: (!) 109/53   (!) 96/45  Pulse: 91  86   Resp: (!) 21  17   Temp: 99.1 F (37.3 C)     TempSrc: Oral     SpO2: 100%  100%   Weight:  113.4 kg    Height:  5\' 6"  (1.676 m)      Constitutional: NAD, calm, comfortable Vitals:   06/10/19 0005 06/10/19 0008 06/10/19 0030 06/10/19 0100  BP: (!) 109/53   (!) 96/45  Pulse: 91  86   Resp: (!) 21  17   Temp: 99.1 F (37.3 C)     TempSrc: Oral     SpO2: 100%  100%   Weight:  113.4 kg    Height:  5\' 6"  (1.676 m)     Eyes: PERRL, lids and conjunctivae normal ENMT: Mucous membranes are dry.  Posterior pharynx clear of any exudate or lesions.Normal dentition.  Neck: normal, supple, no masses, no thyromegaly Respiratory: clear to auscultation bilaterally, no wheezing, no crackles. Normal respiratory effort. No accessory muscle use.  Cardiovascular: Regular rate and  rhythm, no murmurs / rubs / gallops.  Bilateral 3+ pedal edema. 2+ pedal pulses. No carotid bruits.  Abdomen: Generalized abdominal tenderness.  Musculoskeletal: no clubbing / cyanosis. No joint deformity upper and lower extremities. Good ROM, no contractures. Normal muscle tone.  Skin: no rashes, lesions, ulcers. No induration Neurologic: CN 2-12 grossly intact. Sensation intact, DTR normal.  Strength is 5 x 5 in both upper extremities, 3 x 5 in both lower extremities  psychiatric: Normal judgment and insight. Alert and oriented x 3. Normal mood.    Decubitus Ulcers: Not present on admission Catheters and tubes: None  Data Review:    Labs on Admission: I have personally reviewed following labs and imaging studies  CBC: Recent Labs  Lab 06/10/19 0029  WBC 5.6  NEUTROABS 4.2  HGB 4.1*  HCT 14.0*  MCV 103.7*  PLT 841   Basic Metabolic Panel: Recent Labs  Lab 06/10/19 0029  NA 135  K 3.9  CL 101  CO2 26  GLUCOSE 214*  BUN 36*  CREATININE 1.78*  CALCIUM 8.5*   GFR: Estimated Creatinine Clearance: 38.1 mL/min (A) (by C-G formula based on SCr of 1.78 mg/dL (H)). Liver Function Tests: No results for input(s): AST, ALT, ALKPHOS, BILITOT, PROT, ALBUMIN in the last 168 hours. No results for input(s): LIPASE, AMYLASE in the last 168  hours. No results for input(s): AMMONIA in the last 168 hours. Coagulation Profile: No results for input(s): INR, PROTIME in the last 168 hours. Cardiac Enzymes: No results for input(s): CKTOTAL, CKMB, CKMBINDEX, TROPONINI in the last 168 hours. BNP (last 3 results) No results for input(s): PROBNP in the last 8760 hours. HbA1C: No results for input(s): HGBA1C in the last 72 hours. CBG: No results for input(s): GLUCAP in the last 168 hours. Lipid Profile: No results for input(s): CHOL, HDL, LDLCALC, TRIG, CHOLHDL, LDLDIRECT in the last 72 hours. Thyroid Function Tests: No results for input(s): TSH, T4TOTAL, FREET4, T3FREE, THYROIDAB in the  last 72 hours. Anemia Panel: No results for input(s): VITAMINB12, FOLATE, FERRITIN, TIBC, IRON, RETICCTPCT in the last 72 hours. Urine analysis:    Component Value Date/Time   COLORURINE YELLOW 05/19/2019 0851   APPEARANCEUR CLEAR 05/19/2019 0851   LABSPEC 1.013 05/19/2019 0851   PHURINE 5.0 05/19/2019 0851   GLUCOSEU NEGATIVE 05/19/2019 0851   HGBUR LARGE (A) 05/19/2019 0851   BILIRUBINUR NEGATIVE 05/19/2019 0851   KETONESUR 5 (A) 05/19/2019 0851   PROTEINUR NEGATIVE 05/19/2019 0851   UROBILINOGEN 0.2 12/04/2014 0650   NITRITE NEGATIVE 05/19/2019 0851   LEUKOCYTESUR NEGATIVE 05/19/2019 0851     Imaging Results:      Radiological Exams on Admission: DG Chest Port 1 View  Result Date: 06/10/2019 CLINICAL DATA:  Chest pain EXAM: PORTABLE CHEST 1 VIEW COMPARISON:  05/19/2019 FINDINGS: Previously seen patchy opacity in the left upper lobe has improved with mild residual density. Right lung clear. Heart borderline in size. No effusions or acute bony abnormality. IMPRESSION: Minimal residual left upper lobe opacity, improving since prior study. Electronically Signed   By: Rolm Baptise M.D.   On: 06/10/2019 01:04      [facility] Ginette Otto MD [facility]  If 7PM-7AM, please contact night-coverage   06/10/2019, 1:34 AM

## 2019-06-10 NOTE — ED Provider Notes (Addendum)
[facility] EMERGENCY DEPARTMENT Provider Note   CSN: [id number] Arrival date & time: 06/09/19  2347   History Chief Complaint  Patient presents with  . Chest Pain    Stephanie Combs is a 69 y.o. female.  The history is provided by the patient.  Chest Pain She has a complicated history which includes hypertension, diabetes, diastolic heart failure, coronary artery disease status post stent placement, chronic kidney disease, metastatic lung cancer and was transferred here from a skilled nursing facility because of an episode of chest pain.  Patient describes a severe pain going across her chest without radiation.  There was slight worsening of her chronic dyspnea and there was nausea without vomiting.  She denies diaphoresis.  Nothing made the pain better, nothing made it worse.  EMS gave her aspirin and nitroglycerin with some improvement in her pain.  Past Medical History:  Diagnosis Date  . Anxiety   . CAD (coronary artery disease)    a. s/p DES to LAD and angioplasty to D1 in 05/2017  . Cancer ([place])   . CHF (congestive heart failure) (HCC)    Diastolic  . Chronic back pain   . Chronic pain   . COPD (chronic obstructive pulmonary disease) ([place])   . Degenerative disk disease   . Diabetes mellitus without complication ([place])   . History of medication noncompliance 11/2017   "the patient frequently self-adjusts medications without notifying her physicians"  . Hypertension   . Hypothyroidism   . On home O2    2.5 L N/C prn  . Pedal edema     Patient Active Problem List   Diagnosis Date Noted  . Goals of care, counseling/discussion   . Advanced care planning/counseling discussion   . Palliative care by specialist   . Do not resuscitate   . Anemia due to GI blood loss 04/25/2019  . Pressure injury of skin 04/12/2019  . Fluid overload 04/10/2019  . Lung cancer ([place]) 04/10/2019  . Acute encephalopathy 04/02/2019  . Secondary malignant neoplasm of brain ([place]) 03/09/2019    . Acute on chronic diastolic heart failure ([place]) 10/11/2018  . Assistance needed with transportation 09/22/2018  . Primary cancer of hepatic flexure of colon ([place])   . Small cell lung cancer, left upper lobe ([place])   . Colonic mass 09/10/2018  . Colon polyps 09/10/2018  . Cellulitis of leg, left 09/10/2018  . Pain of lower extremity   . Non-intractable vomiting   . Abdominal pain, epigastric   . Heme positive stool   . Acute on chronic diastolic CHF (congestive heart failure) ([place]) 09/06/2018  . Symptomatic anemia 08/31/2018  . Anemia 08/30/2018  . Chronic pain 07/05/2018  . On home O2 07/05/2018  . Opioid dependence ([place]) 07/05/2018  . CAD (coronary artery disease) 05/14/2018  . AKI (acute kidney injury) ([place]) 04/05/2018  . Cellulitis 01/10/2018  . Cellulitis of right lower extremity 01/07/2018  . Altered mental status 01/06/2018  . Type 2 diabetes mellitus with hypoglycemia without coma ([place]) 11/24/2017  . Anxiety 11/24/2017  . Chronic respiratory failure with hypoxia ([place]) 11/24/2017  . Syncope 10/24/2017  . Bradycardia   . Syncope due to orthostatic hypotension 09/04/2017  . Obesity, Class III, BMI 40-49.9 (morbid obesity) ([place]) 09/04/2017  . Abnormal nuclear stress test 05/19/2017  . Atypical angina ([place]) 05/19/2017  . Edema 05/15/2017  . Skin ulcer ([place]) 01/30/2017  . Tinea cruris 10/10/2016  . Vitamin D deficiency 08/19/2016  . Personal history of noncompliance with medical treatment, presenting  hazards to health 06/17/2016  . CAP (community acquired pneumonia) 07/25/2015  . Pressure ulcer 05/02/2015  . Hyponatremia 05/01/2015  . Acute-on-chronic kidney injury ([place]) 05/01/2015  . Uncontrolled type 2 diabetes mellitus with stage 3 chronic kidney disease ([place]) 05/01/2015  . Cellulitis of foot, right 03/24/2015  . Peripheral edema 12/04/2014  . Acute renal failure superimposed on stage 3 chronic kidney disease ([place]) 11/24/2014  . Bilateral lower extremity edema   .  Diabetic ulcer of right great toe ([place])   . Foot ulcer due to secondary DM ([place]) 11/07/2014  . Diabetic ulcer of right foot ([place]) 11/07/2014  . Edema of lower extremity 05/27/2013  . CKD (chronic kidney disease), stage III ([place]) 04/14/2013  . Anemia in CKD (chronic kidney disease) 04/14/2013  . Chest pain 04/14/2013  . Dyspnea 04/14/2013  . Chronic diastolic CHF (congestive heart failure) ([place])   . Hypothyroidism   . COPD (chronic obstructive pulmonary disease) ([place]) 08/07/2012  . Morbid obesity ([place]) 08/07/2012  . DM type 2 (diabetes mellitus, type 2) ([place]) 08/07/2012  . HTN (hypertension), benign 08/07/2012  . ARF (acute renal failure) ([place]) 08/07/2012    Past Surgical History:  Procedure Laterality Date  . BIOPSY  09/09/2018   Procedure: BIOPSY;  Surgeon: Daneil Dolin, MD;  [facility];  Service: Endoscopy;;  gastric esophageal hepatic flexure colon  . COLONOSCOPY WITH PROPOFOL N/A 09/09/2018   hepatic flexure colon mass s/p biopsy, with polyps associated with tumor scattered all the way to the cecum not removed, multiple splenic flexure and rectal polyps removed, left-sided diverticulosis, ?right hemicolectomy. Hepatic flexure adenocarcinoma and additional adenocarcinoma in small splenic flexure polyp. Concern for synchronous colon cancer. High risk for surgical resection.   . CORONARY STENT INTERVENTION N/A 05/22/2017   Procedure: CORONARY STENT INTERVENTION;  Surgeon: Martinique, Peter M, MD;  Location: [facility] [facility];  Service: Cardiovascular;  Laterality: N/A;  . ESOPHAGOGASTRODUODENOSCOPY (EGD) WITH PROPOFOL N/A 09/09/2018   abnormal distal esophagus suspicious for Barrett's s/p biopsy, erosive gastropathy, duodenal bulbar erosions. +Barrett's  . HERNIA REPAIR    . POLYPECTOMY  09/09/2018   Procedure: POLYPECTOMY;  Surgeon: Daneil Dolin, MD;  [facility];  Service: Endoscopy;;  colon  . RIGHT/LEFT HEART CATH AND CORONARY ANGIOGRAPHY N/A 05/19/2017    Procedure: RIGHT/LEFT HEART CATH AND CORONARY ANGIOGRAPHY;  Surgeon: Leonie Man, MD;  Location: [facility];  Service: Cardiovascular;  Laterality: N/A;  . TUBAL LIGATION    . VIDEO BRONCHOSCOPY WITH ENDOBRONCHIAL ULTRASOUND Left 09/14/2018   Procedure: VIDEO BRONCHOSCOPY WITH ENDOBRONCHIAL ULTRASOUND with biopsies;  Surgeon: Margaretha Seeds, MD;  Location: [facility];  Service: Thoracic;  Laterality: Left;     OB History   No obstetric history on file.     Family History  Problem Relation Age of Onset  . Stroke Mother        deceased 18   . Heart attack Father        deceased 74   . Diabetes Brother   . Cerebral palsy Brother   . Pneumonia Brother   . Diabetes Other   . Heart attack Other   . Colon cancer Neg Hx     Social History   Tobacco Use  . Smoking status: Former Smoker    Packs/day: 1.50    Years: 45.00    Pack years: 67.50    Types: Cigarettes    Quit date: 11/24/2014    Years since quitting: 4.5  . Smokeless  tobacco: Never Used  Substance Use Topics  . Alcohol use: No    Alcohol/week: 0.0 standard drinks  . Drug use: No    Home Medications Prior to Admission medications   Medication Sig Start Date End Date Taking? Authorizing Provider  albuterol (PROAIR HFA) 108 (90 Base) MCG/ACT inhaler Inhale 2 puffs into the lungs every 4 (four) hours as needed for wheezing or shortness of breath. For shortness of breath 09/22/18   Eugenie Filler, MD  albuterol (PROVENTIL) (2.5 MG/3ML) 0.083% nebulizer solution Take 2.5 mg by nebulization 2 (two) times daily as needed for wheezing or shortness of breath.     [provider]  allopurinol (ZYLOPRIM) 300 MG tablet Take 300 mg by mouth daily.    [provider]  ALPRAZolam Duanne Moron) 1 MG tablet Take 1 tablet (1 mg total) by mouth 3 (three) times daily as needed for anxiety or sleep. 05/24/19   Maryanna Shape, NP  Amino Acids-Protein Hydrolys (FEEDING SUPPLEMENT, PRO-STAT SUGAR FREE 64,) LIQD Take  30 mLs by mouth daily. 05/24/19   Maryanna Shape, NP  aspirin EC 81 MG tablet Take 81 mg by mouth daily.    [provider]  atorvastatin (LIPITOR) 80 MG tablet Take 1 tablet (80 mg total) by mouth daily at 6 PM. 05/26/17   [name], MD  busPIRone (BUSPAR) 7.5 MG tablet Take 7.5 mg by mouth 2 (two) times daily. 03/10/19   [provider]  cholecalciferol (VITAMIN D3) 25 MCG (1000 UT) tablet Take 5,000 Units by mouth daily.    [provider]  diphenhydrAMINE-zinc acetate (BENADRYL) cream Apply 1 application topically 3 (three) times daily as needed for itching. Patient taking differently: Apply 1 application topically 3 (three) times daily as needed for itching. Patient uses on her head for dry scalp. 03/09/19   Truitt Merle, MD  feeding supplement, ENSURE ENLIVE, (ENSURE ENLIVE) LIQD Take 237 mLs by mouth 2 (two) times daily between meals. 05/24/19   Maryanna Shape, NP  furosemide (LASIX) 40 MG tablet Take 1 tablet (40 mg total) by mouth daily. 04/28/19   Roxan Hockey, MD  gabapentin (NEURONTIN) 600 MG tablet Take 600 mg by mouth 3 (three) times daily.     [provider]  hydrocortisone (ANUSOL-HC) 2.5 % rectal cream Place 1 application rectally 4 (four) times daily as needed for hemorrhoids.  01/03/19   [provider]  Hydrocortisone (GERHARDT'S BUTT CREAM) CREA Apply 1 application topically 4 (four) times daily as needed for irritation. 12/16/18   Truitt Merle, MD  insulin aspart (NOVOLOG) 100 UNIT/ML injection Inject 0-9 Units into the skin See admin instructions. insulin aspart (novoLOG) injection 0-9 Units 0-9 Units,  Subcutaneous, 3 times daily with meals, CBG < 70: Implement Hypoglycemia Standing Orders and refer to Hypoglycemia Standing Orders sidebar report  CBG 70 - 120: 0 units  CBG 121 - 150: 1 unit  CBG 151 - 200: 2 units  CBG 201 - 250: 3 units  CBG 251 - 300: 5 units  CBG 301 - 350: 7 units  CBG 351 - 400: 9 units CBG > 400: 04/28/19    Emokpae, Courage, MD  insulin detemir (LEVEMIR) 100 UNIT/ML injection Inject 0.34 mLs (34 Units total) into the skin at bedtime. 05/24/19   Maryanna Shape, NP  levothyroxine (SYNTHROID, LEVOTHROID) 200 MCG tablet Take 200 mcg by mouth daily before breakfast.  06/24/18   [provider]  Multiple Vitamin (MULTIVITAMIN WITH MINERALS) TABS tablet Take 1 tablet  by mouth daily. 05/25/19   Maryanna Shape, NP  nitroGLYCERIN (NITROSTAT) 0.4 MG SL tablet Place 0.4 mg under the tongue every 5 (five) minutes as needed for chest pain.  03/26/18   [provider]  nystatin (MYCOSTATIN/NYSTOP) powder Apply topically 2 (two) times daily. 02/22/19   Alla Feeling, NP  Omega-3 Fatty Acids (FISH OIL PO) Take 1 capsule by mouth daily.     [provider]  ondansetron (ZOFRAN) 8 MG tablet Take 1 tablet (8 mg total) by mouth every 8 (eight) hours as needed for nausea or vomiting. 04/28/19   Denton Brick, Courage, MD  oxyCODONE (OXY IR/ROXICODONE) 5 MG immediate release tablet Take 1-2 tablets (5-10 mg total) by mouth every 6 (six) hours as needed for severe pain. 05/31/19   Alla Feeling, NP  OXYGEN Inhale 2 L into the lungs continuous.    [provider]  pantoprazole (PROTONIX) 40 MG tablet Take 1 tablet (40 mg total) by mouth 2 (two) times daily. 10/07/18   Truitt Merle, MD  polyethylene glycol (MIRALAX / GLYCOLAX) 17 g packet Take 17 g by mouth 2 (two) times daily. Patient taking differently: Take 17 g by mouth daily as needed for moderate constipation.  04/14/19   Shelly Coss, MD  potassium chloride SA (KLOR-CON) 20 MEQ tablet Take 1 tablet (20 mEq total) by mouth daily. 04/28/19   Roxan Hockey, MD  promethazine (PHENERGAN) 12.5 MG tablet Take 1 tablet (12.5 mg total) by mouth every 6 (six) hours as needed for nausea or vomiting. 02/22/19   Alla Feeling, NP  senna (SENOKOT) 8.6 MG TABS tablet Take 2 tablets (17.2 mg total) by mouth 2 (two) times daily. Patient taking  differently: Take 2 tablets by mouth 2 (two) times daily as needed for moderate constipation.  04/28/19   Roxan Hockey, MD  silver sulfADIAZINE (SILVADENE) 1 % cream Apply 1 application topically 2 (two) times daily.  01/03/19   [provider]  simethicone (MYLICON) 80 MG chewable tablet Chew 1 tablet (80 mg total) by mouth 3 (three) times daily as needed for flatulence. 05/24/19   Maryanna Shape, NP  sucralfate (CARAFATE) 1 g tablet Take 1 tablet (1 g total) by mouth 4 (four) times daily -  with meals and at bedtime. 5 min before meals for radiation induced esophagitis 12/03/18   Tyler Pita, MD  SURE COMFORT INS SYR 1CC/28G 28G X 1/2" 1 ML MISC USE TO INJECT INSULIN UP TO 4 TIMES DAILY. 03/06/17   Cassandria Anger, MD  tiZANidine (ZANAFLEX) 4 MG tablet Take 1 tablet (4 mg total) by mouth every 8 (eight) hours as needed for muscle spasms. 05/10/19   Truitt Merle, MD  topiramate (TOPAMAX) 25 MG tablet Take 75 mg by mouth at bedtime.  03/18/18   [provider]  umeclidinium-vilanterol (ANORO ELLIPTA) 62.5-25 MCG/INH AEPB Inhale 1 puff into the lungs daily. 05/26/17   [name], MD    Allergies    Dilaudid [hydromorphone hcl], Midodrine hcl, Actifed cold-allergy [chlorpheniramine-phenylephrine], Doxycycline, Other, Penicillins, Reglan [metoclopramide], Valium [diazepam], and Vistaril [hydroxyzine hcl]  Review of Systems   Review of Systems  Cardiovascular: Positive for chest pain.  All other systems reviewed and are negative.   Physical Exam Updated Vital Signs BP (!) 109/53 (BP Location: Left Arm)   Pulse 91   Temp 99.1 F (37.3 C) (Oral)   Resp (!) 21   Ht 5\' 6"  (1.676 m)   Wt 113.4 kg   SpO2 100%  BMI 40.35 kg/m   Physical Exam Vitals and nursing note reviewed.   69 year old female, resting comfortably and in no acute distress. Vital signs are significant for mildly elevated respiratory rate. Oxygen saturation is 100%, which is normal. Head is  normocephalic and atraumatic. PERRLA, EOMI. Oropharynx is clear. Neck is nontender and supple without adenopathy or JVD. Back is nontender and there is no CVA tenderness. Lungs are clear without rales, wheezes, or rhonchi. Chest is nontender. Heart has regular rate and rhythm without murmur. Abdomen is soft, flat, nontender without masses or hepatosplenomegaly and peristalsis is normoactive. Extremities have trace edema. Skin is warm and dry without rash. Neurologic: Mental status is normal, cranial nerves are intact, there are no motor or sensory deficits.  ED Results / Procedures / Treatments   Labs (all labs ordered are listed, but only abnormal results are displayed) Labs Reviewed  CBC WITH DIFFERENTIAL/PLATELET - Abnormal; Notable for the following components:      Result Value   RBC 1.35 (*)    Hemoglobin 4.1 (*)    HCT 14.0 (*)    MCV 103.7 (*)    MCHC 29.3 (*)    RDW 17.7 (*)    All other components within normal limits  BASIC METABOLIC PANEL - Abnormal; Notable for the following components:   Glucose, Bld 214 (*)    BUN 36 (*)    Creatinine, Ser 1.78 (*)    Calcium 8.5 (*)    GFR calc non Af Amer 29 (*)    GFR calc Af Amer 33 (*)    All other components within normal limits  POC OCCULT BLOOD, ED - Abnormal; Notable for the following components:   Fecal Occult Bld POSITIVE (*)    All other components within normal limits  RESPIRATORY PANEL BY RT PCR (FLU A&B, COVID)  PREPARE RBC (CROSSMATCH)  TYPE AND SCREEN  TROPONIN I (HIGH SENSITIVITY)    EKG EKG Interpretation  Date/Time:  Friday June 10 2019 00:03:39 EST Ventricular Rate:  89 PR Interval:    QRS Duration: 91 QT Interval:  369 QTC Calculation: 449 R Axis:   71 Text Interpretation: Sinus rhythm Premature atrial complexes Baseline wander in lead(s) V6 When compared with ECG of 03/31/2019, No significant change was found Confirmed by Delora Fuel ([id number]) on 06/10/2019 12:14:06 AM   Radiology DG Chest  Port 1 View  Result Date: 06/10/2019 CLINICAL DATA:  Chest pain EXAM: PORTABLE CHEST 1 VIEW COMPARISON:  05/19/2019 FINDINGS: Previously seen patchy opacity in the left upper lobe has improved with mild residual density. Right lung clear. Heart borderline in size. No effusions or acute bony abnormality. IMPRESSION: Minimal residual left upper lobe opacity, improving since prior study. Electronically Signed   By: Rolm Baptise M.D.   On: 06/10/2019 01:04    Procedures Procedures  CRITICAL CARE Performed by: Delora Fuel Total critical care time: 50 minutes Critical care time was exclusive of separately billable procedures and treating other patients. Critical care was necessary to treat or prevent imminent or life-threatening deterioration. Critical care was time spent personally by me on the following activities: development of treatment plan with patient and/or surrogate as well as nursing, discussions with consultants, evaluation of patient's response to treatment, examination of patient, obtaining history from patient or surrogate, ordering and performing treatments and interventions, ordering and review of laboratory studies, ordering and review of radiographic studies, pulse oximetry and re-evaluation of patient's condition.  Medications Ordered in ED Medications  0.9 %  sodium  chloride infusion (10 mL/hr Intravenous New Bag/Given 06/10/19 0134)    ED Course  I have reviewed the triage vital signs and the nursing notes.  Pertinent labs & imaging results that were available during my care of the patient were reviewed by me and considered in my medical decision making (see chart for details).  MDM Rules/Calculators/A&P Chest pain which has resolved.  Patient has a complicated medical history but did have stent placed in her LAD 2 years ago.  Further review of past records shows recent hospitalization for anemia requiring blood transfusion.  Oncologist noted patient has metastatic lung cancer  and not a candidate for further chemotherapy and hospice care was recommended although patient has declined hospice care.  She is DNR.  ECG shows no ST or T changes.  Will check chest x-ray and troponin levels.  Chest x-ray shows no acute change.  Labs show hemoglobin 4.1 with microcytic indices.  On January 10, hemoglobin was 9.3 following blood transfusions.  Creatinine has increased from 0.91 on January 10, currently 1.78.  Troponin is normal.  Stool is found to be Hemoccult positive.  RBC transfusion is ordered.  Case is discussed with Dr. Scherrie November of [facility] hospitalists, who agrees to admit the patient.  Final Clinical Impression(s) / ED Diagnoses Final diagnoses:  Symptomatic anemia  Nonspecific chest pain  Acute kidney injury (nontraumatic) (HCC)  Small cell lung cancer ([place])  Guaiac positive stools    Rx / DC Orders ED Discharge Orders    None       Delora Fuel, MD 51/70/01 [id number]    Delora Fuel, MD 49/67/59 [phone]

## 2019-06-10 NOTE — ED Notes (Signed)
Hospitalist at bedside 

## 2019-06-11 DIAGNOSIS — J449 Chronic obstructive pulmonary disease, unspecified: Secondary | ICD-10-CM | POA: Diagnosis not present

## 2019-06-11 DIAGNOSIS — C3412 Malignant neoplasm of upper lobe, left bronchus or lung: Secondary | ICD-10-CM | POA: Diagnosis not present

## 2019-06-11 DIAGNOSIS — M6281 Muscle weakness (generalized): Secondary | ICD-10-CM | POA: Diagnosis not present

## 2019-06-11 DIAGNOSIS — D649 Anemia, unspecified: Secondary | ICD-10-CM | POA: Diagnosis not present

## 2019-06-11 DIAGNOSIS — Z87891 Personal history of nicotine dependence: Secondary | ICD-10-CM | POA: Diagnosis not present

## 2019-06-11 DIAGNOSIS — K922 Gastrointestinal hemorrhage, unspecified: Secondary | ICD-10-CM | POA: Diagnosis not present

## 2019-06-11 DIAGNOSIS — N183 Chronic kidney disease, stage 3 unspecified: Secondary | ICD-10-CM | POA: Diagnosis not present

## 2019-06-11 DIAGNOSIS — R195 Other fecal abnormalities: Secondary | ICD-10-CM | POA: Diagnosis not present

## 2019-06-11 DIAGNOSIS — I25118 Atherosclerotic heart disease of native coronary artery with other forms of angina pectoris: Secondary | ICD-10-CM | POA: Diagnosis not present

## 2019-06-11 DIAGNOSIS — R319 Hematuria, unspecified: Secondary | ICD-10-CM | POA: Diagnosis not present

## 2019-06-11 DIAGNOSIS — Z20822 Contact with and (suspected) exposure to covid-19: Secondary | ICD-10-CM | POA: Diagnosis not present

## 2019-06-11 DIAGNOSIS — L89896 Pressure-induced deep tissue damage of other site: Secondary | ICD-10-CM | POA: Diagnosis not present

## 2019-06-11 DIAGNOSIS — D500 Iron deficiency anemia secondary to blood loss (chronic): Secondary | ICD-10-CM | POA: Diagnosis not present

## 2019-06-11 DIAGNOSIS — D62 Acute posthemorrhagic anemia: Secondary | ICD-10-CM | POA: Diagnosis not present

## 2019-06-11 DIAGNOSIS — C7931 Secondary malignant neoplasm of brain: Secondary | ICD-10-CM | POA: Diagnosis not present

## 2019-06-11 DIAGNOSIS — C3490 Malignant neoplasm of unspecified part of unspecified bronchus or lung: Secondary | ICD-10-CM | POA: Diagnosis not present

## 2019-06-11 DIAGNOSIS — J181 Lobar pneumonia, unspecified organism: Secondary | ICD-10-CM | POA: Diagnosis not present

## 2019-06-11 DIAGNOSIS — K219 Gastro-esophageal reflux disease without esophagitis: Secondary | ICD-10-CM | POA: Diagnosis not present

## 2019-06-11 DIAGNOSIS — Z743 Need for continuous supervision: Secondary | ICD-10-CM | POA: Diagnosis not present

## 2019-06-11 DIAGNOSIS — L89313 Pressure ulcer of right buttock, stage 3: Secondary | ICD-10-CM | POA: Diagnosis not present

## 2019-06-11 DIAGNOSIS — C3492 Malignant neoplasm of unspecified part of left bronchus or lung: Secondary | ICD-10-CM | POA: Diagnosis not present

## 2019-06-11 DIAGNOSIS — C19 Malignant neoplasm of rectosigmoid junction: Secondary | ICD-10-CM | POA: Diagnosis not present

## 2019-06-11 DIAGNOSIS — E109 Type 1 diabetes mellitus without complications: Secondary | ICD-10-CM | POA: Diagnosis not present

## 2019-06-11 DIAGNOSIS — R69 Illness, unspecified: Secondary | ICD-10-CM | POA: Diagnosis not present

## 2019-06-11 DIAGNOSIS — R5381 Other malaise: Secondary | ICD-10-CM | POA: Diagnosis not present

## 2019-06-11 DIAGNOSIS — L89329 Pressure ulcer of left buttock, unspecified stage: Secondary | ICD-10-CM | POA: Diagnosis not present

## 2019-06-11 DIAGNOSIS — C183 Malignant neoplasm of hepatic flexure: Secondary | ICD-10-CM | POA: Diagnosis not present

## 2019-06-11 DIAGNOSIS — Z7401 Bed confinement status: Secondary | ICD-10-CM | POA: Diagnosis not present

## 2019-06-11 DIAGNOSIS — E039 Hypothyroidism, unspecified: Secondary | ICD-10-CM | POA: Diagnosis not present

## 2019-06-11 DIAGNOSIS — I130 Hypertensive heart and chronic kidney disease with heart failure and stage 1 through stage 4 chronic kidney disease, or unspecified chronic kidney disease: Secondary | ICD-10-CM | POA: Diagnosis not present

## 2019-06-11 DIAGNOSIS — Z794 Long term (current) use of insulin: Secondary | ICD-10-CM | POA: Diagnosis not present

## 2019-06-11 DIAGNOSIS — E10622 Type 1 diabetes mellitus with other skin ulcer: Secondary | ICD-10-CM | POA: Diagnosis not present

## 2019-06-11 DIAGNOSIS — R29898 Other symptoms and signs involving the musculoskeletal system: Secondary | ICD-10-CM | POA: Diagnosis not present

## 2019-06-11 DIAGNOSIS — K625 Hemorrhage of anus and rectum: Secondary | ICD-10-CM | POA: Diagnosis not present

## 2019-06-11 DIAGNOSIS — E119 Type 2 diabetes mellitus without complications: Secondary | ICD-10-CM | POA: Diagnosis not present

## 2019-06-11 DIAGNOSIS — R404 Transient alteration of awareness: Secondary | ICD-10-CM | POA: Diagnosis not present

## 2019-06-11 DIAGNOSIS — N390 Urinary tract infection, site not specified: Secondary | ICD-10-CM | POA: Diagnosis not present

## 2019-06-11 DIAGNOSIS — Z79899 Other long term (current) drug therapy: Secondary | ICD-10-CM | POA: Diagnosis not present

## 2019-06-11 DIAGNOSIS — I5032 Chronic diastolic (congestive) heart failure: Secondary | ICD-10-CM | POA: Diagnosis not present

## 2019-06-11 DIAGNOSIS — I2510 Atherosclerotic heart disease of native coronary artery without angina pectoris: Secondary | ICD-10-CM | POA: Diagnosis not present

## 2019-06-11 DIAGNOSIS — R2689 Other abnormalities of gait and mobility: Secondary | ICD-10-CM | POA: Diagnosis not present

## 2019-06-11 DIAGNOSIS — J9611 Chronic respiratory failure with hypoxia: Secondary | ICD-10-CM | POA: Diagnosis not present

## 2019-06-11 DIAGNOSIS — C189 Malignant neoplasm of colon, unspecified: Secondary | ICD-10-CM | POA: Diagnosis not present

## 2019-06-11 DIAGNOSIS — R627 Adult failure to thrive: Secondary | ICD-10-CM | POA: Diagnosis not present

## 2019-06-11 DIAGNOSIS — L89322 Pressure ulcer of left buttock, stage 2: Secondary | ICD-10-CM | POA: Diagnosis not present

## 2019-06-11 DIAGNOSIS — R6889 Other general symptoms and signs: Secondary | ICD-10-CM | POA: Diagnosis not present

## 2019-06-11 DIAGNOSIS — E1122 Type 2 diabetes mellitus with diabetic chronic kidney disease: Secondary | ICD-10-CM | POA: Diagnosis not present

## 2019-06-11 LAB — CBC
HCT: 25.6 % — ABNORMAL LOW (ref 36.0–46.0)
Hemoglobin: 7.8 g/dL — ABNORMAL LOW (ref 12.0–15.0)
MCH: 30.1 pg (ref 26.0–34.0)
MCHC: 30.5 g/dL (ref 30.0–36.0)
MCV: 98.8 fL (ref 80.0–100.0)
Platelets: 246 10*3/uL (ref 150–400)
RBC: 2.59 MIL/uL — ABNORMAL LOW (ref 3.87–5.11)
RDW: 16.2 % — ABNORMAL HIGH (ref 11.5–15.5)
WBC: 5 10*3/uL (ref 4.0–10.5)
nRBC: 0 % (ref 0.0–0.2)

## 2019-06-11 LAB — BASIC METABOLIC PANEL
Anion gap: 10 (ref 5–15)
BUN: 33 mg/dL — ABNORMAL HIGH (ref 8–23)
CO2: 28 mmol/L (ref 22–32)
Calcium: 8.7 mg/dL — ABNORMAL LOW (ref 8.9–10.3)
Chloride: 98 mmol/L (ref 98–111)
Creatinine, Ser: 1.31 mg/dL — ABNORMAL HIGH (ref 0.44–1.00)
GFR calc Af Amer: 48 mL/min — ABNORMAL LOW (ref >60)
GFR calc non Af Amer: 41 mL/min — ABNORMAL LOW (ref >60)
Glucose, Bld: 284 mg/dL — ABNORMAL HIGH (ref 70–99)
Potassium: 3.8 mmol/L (ref 3.5–5.1)
Sodium: 136 mmol/L (ref 135–145)

## 2019-06-11 LAB — GLUCOSE, CAPILLARY: Glucose-Capillary: 300 mg/dL — ABNORMAL HIGH (ref 70–99)

## 2019-06-11 MED ORDER — BUSPIRONE HCL 7.5 MG PO TABS: 7.5000 mg | ORAL_TABLET | Freq: Two times a day (BID) | ORAL | 0 refills | Status: DC

## 2019-06-11 MED ORDER — ALPRAZOLAM 1 MG PO TABS: 1.0000 mg | ORAL_TABLET | Freq: Three times a day (TID) | ORAL | 0 refills | Status: AC | PRN

## 2019-06-11 MED ORDER — UMECLIDINIUM-VILANTEROL 62.5-25 MCG/INH IN AEPB
INHALATION_SPRAY | RESPIRATORY_TRACT | Status: AC
  Filled 2019-06-11: qty 14

## 2019-06-11 MED ORDER — OXYCODONE HCL 5 MG PO TABS: 5.0000 mg | ORAL_TABLET | Freq: Four times a day (QID) | ORAL | 0 refills | Status: AC | PRN

## 2019-06-11 MED ORDER — TROLAMINE SALICYLATE 10 % EX CREA: 1.0000 "application " | TOPICAL_CREAM | CUTANEOUS | 0 refills | Status: AC | PRN

## 2019-06-11 MED ORDER — TIZANIDINE HCL 4 MG PO TABS: 4.0000 mg | ORAL_TABLET | Freq: Three times a day (TID) | ORAL | 0 refills | Status: AC | PRN

## 2019-06-11 NOTE — Care Management Obs Status (Signed)
[place] NOTIFICATION   Patient Details  Name: Stephanie Combs MRN: [id number] Date of Birth: 04/08/1951   Medicare Observation Status Notification Given:  Yes    Clarkson Rosselli, Chauncey Reading, RN 06/11/2019, 12:07 PM

## 2019-06-11 NOTE — TOC Transition Note (Addendum)
Transition of Care [facility]) - CM/SW Discharge Note   Patient Details  Name: Stephanie Combs MRN: [id number] Date of Birth: 09-14-1950  [facility]) CM/SW Contact:  Tityana Pagan, Chauncey Reading, RN Phone Number: 06/11/2019, 9:58 AM   Clinical Narrative:   Patient ready to DC back to [facility]. DC clinicals loaded on Hub. Patient will go to A8-1. Bedside RN to call report to [phone].  Final next level of care: Skilled Nursing Facility Barriers to Discharge: Barriers Resolved Discharge Placement              Patient chooses bed at: Other - please specify in the comment section below:([facility]) Patient to be transferred to facility by: [facility]             Social Determinants of Health (SDOH) Interventions     Readmission Risk Interventions Readmission Risk Prevention Plan 04/28/2019 04/11/2019 10/13/2018  Transportation Screening Complete Complete Complete  Medication Review Press photographer) Complete Complete Complete  PCP or Specialist appointment within 3-5 days of discharge Complete Complete (No Data)  PCP/Specialist Appt Not Complete comments - - Recent admit; remains acutely ill  HRI or Home Care Consult Complete Complete Complete  SW Recovery Care/Counseling Consult Complete Not Complete Complete  SW Consult Not Complete Comments - not appropriate -  Palliative Care Screening Complete Not Applicable Not Applicable  Skilled Nursing Facility Complete Not Applicable Not Applicable  Some recent data might be hidden

## 2019-06-11 NOTE — Care Management CC44 (Signed)
Condition Code 44 Documentation Completed  Patient Details  Name: NYELLE WOLFSON MRN: [id number] Date of Birth: Aug 23, 1950   Condition Code 44 given:  Yes Patient signature on Condition Code 44 notice:  Yes Documentation of 2 MD's agreement:  Yes Code 44 added to claim:  Yes    Kimiya Brunelle, Chauncey Reading, RN 06/11/2019, 12:07 PM

## 2019-06-11 NOTE — Progress Notes (Signed)
Attempted to call report. Waited on hold for a while. EMS transport arrived. Case manager and facility secretary aware.

## 2019-06-11 NOTE — Discharge Summary (Signed)
Physician Discharge Summary  PANAYIOTA LARKIN ZCH:[id number] DOB: 06/04/1950 DOA: 06/09/2019  PCP: [name], MD  Admit date: 06/09/2019  Discharge date: 06/11/2019  Admitted From:SNF  Disposition:  SNF  Recommendations for Outpatient Follow-up:  1. Follow up with PCP in 1-2 weeks 2. Repeat CBC in 1 week to ensure stability of hemoglobin levels which was noted to be 7.8 on day of discharge 3. Follow-up with oncologist Dr. Burr Medico as scheduled 4. Continue prior medications  Home Health: None  Equipment/Devices: None  Discharge Condition: Stable  CODE STATUS: DNR  Diet recommendation: Heart Healthy  Brief/Interim Summary: Per HPI: Stephanie Gavina Norwoodis a 69 y.o.femalewith medical history significant ofcoronary artery disease, diabetes, CHF and metastatic small cell lung cancerwith metsto the brainwho was sent in from the nursing home because of chest pain. Patient had relatively sudden onset chest pain while at rest radiating across her chest. Was given nitroglycerin x2 as well as aspirin prior to arrival via EMS with no significant relief in symptoms. Patient reported having slightly worsened shortness of breath from baseline as well as associated nausea. No palpitations, fever, chills, cough, vomiting, diarrhea, abdominal pain. No syncope, diaphoresis, seizures, focal deficits. ED Course: Vital Signs reviewed on presentation, significant fortemperature 99.1, heart rate 80, blood pressure 100/41, saturation 100% on 2 L nasal cannula. Labs reviewed, significant forsodium 135, potassium 3.9, BUN 36, creatinine 1.78, troponin X, WBC count 5.6, hemoglobin 4.1, hematocrit 14, platelets 250. Fecal occult blood is positive. Imaging personally Reviewed,chest x-ray shows minimal left upper lobe opacity, improved from prior study EKG personally reviewed, showssinus rhythm, no acute ST-T changes.  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  She has been recommended palliative care and hospice in the future, but she currently declines these measures.  She was noted to have hemoglobin 4.1 and appears to have been ordered 3 units of PRBCs which are currently transfusing.  We will follow-up CBC and ensure that patient has improved and is near baseline prior to discharge.  She would like to follow-up with her oncologist soon and discuss further treatment options.  1/30: Patient has had improvement in hemoglobin levels from 4.1-7.8 this morning after 3 units of PRBCs.  She appears to be in stable condition and appropriate for discharge back to her skilled facility with repeat CBC recommended within the week.  She will follow up with her oncologist as scheduled as well.  No other acute events noted throughout the course of this admission.  Discharge Diagnoses:  Active Problems:   COPD (chronic obstructive pulmonary disease) (HCC)   Morbid obesity ([place])   DM type 2 (diabetes mellitus, type 2) (HCC)   HTN (hypertension), benign   CKD (chronic kidney disease), stage III (HCC)   Chronic diastolic CHF (congestive heart failure) (HCC)   Hypothyroidism   Anxiety   Chronic respiratory failure with hypoxia (HCC)   Opioid dependence (HCC)   Heme positive stool   Small cell lung cancer, left upper lobe (HCC)   Secondary malignant neoplasm of brain ([place])   Anemia due to GI blood loss   GI bleed  Principal discharge diagnosis: Acute on chronic anemia likely secondary to chronic GI bleed status post 3 unit PRBC transfusion.  Discharge Instructions  Discharge Instructions    Diet - low sodium heart healthy   Complete by: As directed    Increase activity slowly  Complete by: As directed      Allergies as of 06/11/2019      Reactions   Dilaudid [hydromorphone Hcl] Itching   Midodrine Hcl Swelling   After one dose had anaphylactic reaction, had  to call EMS.   Actifed Cold-allergy [chlorpheniramine-phenylephrine]    "I was sick and red and it didn't agree with me at all"   Doxycycline Nausea And Vomiting   Other Cough   Pt states she is allergic to ragweed and that she starts coughing and sneezing like crazy   Penicillins Hives, Itching   Tolerates Rocephin Has patient had a PCN reaction causing immediate rash, facial/tongue/throat swelling, SOB or lightheadedness with hypotension: Yes Has patient had a PCN reaction causing severe rash involving mucus membranes or skin necrosis: No Has patient had a PCN reaction that required hospitalization Yes Has patient had a PCN reaction occurring within the last 10 years: No If all of the above answers are "NO", then may proceed with Cephalosporin use.   Reglan [metoclopramide] Itching   Valium [diazepam] Itching   Vistaril [hydroxyzine Hcl] Itching      Medication List    TAKE these medications   albuterol (2.5 MG/3ML) 0.083% nebulizer solution Commonly known as: PROVENTIL Take 2.5 mg by nebulization 2 (two) times daily as needed for wheezing or shortness of breath.   albuterol 108 (90 Base) MCG/ACT inhaler Commonly known as: ProAir HFA Inhale 2 puffs into the lungs every 4 (four) hours as needed for wheezing or shortness of breath. For shortness of breath   allopurinol 300 MG tablet Commonly known as: ZYLOPRIM Take 300 mg by mouth daily.   ALPRAZolam 1 MG tablet Commonly known as: XANAX Take 1 tablet (1 mg total) by mouth 3 (three) times daily as needed for anxiety or sleep.   aspirin EC 81 MG tablet Take 81 mg by mouth daily.   atorvastatin 80 MG tablet Commonly known as: LIPITOR Take 1 tablet (80 mg total) by mouth daily at 6 PM.   busPIRone 7.5 MG tablet Commonly known as: BUSPAR Take 1 tablet (7.5 mg total) by mouth 2 (two) times daily.   cholecalciferol 25 MCG (1000 UNIT) tablet Commonly known as: VITAMIN D3 Take 5,000 Units by mouth daily.    diphenhydrAMINE-zinc acetate cream Commonly known as: BENADRYL Apply 1 application topically 3 (three) times daily as needed for itching. What changed: additional instructions   feeding supplement (ENSURE ENLIVE) Liqd Take 237 mLs by mouth 2 (two) times daily between meals.   feeding supplement (PRO-STAT SUGAR FREE 64) Liqd Take 30 mLs by mouth daily.   FISH OIL PO Take 1 capsule by mouth daily.   furosemide 40 MG tablet Commonly known as: LASIX Take 1 tablet (40 mg total) by mouth daily.   gabapentin 600 MG tablet Commonly known as: NEURONTIN Take 600 mg by mouth 3 (three) times daily.   Gerhardt's butt cream Crea Apply 1 application topically 4 (four) times daily as needed for irritation.   hydrocortisone 2.5 % rectal cream Commonly known as: ANUSOL-HC Place 1 application rectally 4 (four) times daily as needed for hemorrhoids.   insulin aspart 100 UNIT/ML injection Commonly known as: novoLOG Inject 0-9 Units into the skin See admin instructions. insulin aspart (novoLOG) injection 0-9 Units 0-9 Units,  Subcutaneous, 3 times daily with meals, CBG < 70: Implement Hypoglycemia Standing Orders and refer to Hypoglycemia Standing Orders sidebar report  CBG 70 - 120: 0 units  CBG 121 - 150: 1 unit  CBG 151 -  200: 2 units  CBG 201 - 250: 3 units  CBG 251 - 300: 5 units  CBG 301 - 350: 7 units  CBG 351 - 400: 9 units CBG > 400:   insulin detemir 100 UNIT/ML injection Commonly known as: Levemir Inject 0.34 mLs (34 Units total) into the skin at bedtime.   levothyroxine 200 MCG tablet Commonly known as: SYNTHROID Take 200 mcg by mouth daily before breakfast.   multivitamin with minerals Tabs tablet Take 1 tablet by mouth daily.   nitroGLYCERIN 0.4 MG SL tablet Commonly known as: NITROSTAT Place 0.4 mg under the tongue every 5 (five) minutes as needed for chest pain.   nystatin powder Commonly known as: MYCOSTATIN/NYSTOP Apply topically 2 (two) times daily.    ondansetron 8 MG tablet Commonly known as: ZOFRAN Take 1 tablet (8 mg total) by mouth every 8 (eight) hours as needed for nausea or vomiting.   oxyCODONE 5 MG immediate release tablet Commonly known as: Oxy IR/ROXICODONE Take 1-2 tablets (5-10 mg total) by mouth every 6 (six) hours as needed for severe pain.   OXYGEN Inhale 2 L into the lungs continuous.   pantoprazole 40 MG tablet Commonly known as: PROTONIX Take 1 tablet (40 mg total) by mouth 2 (two) times daily.   polyethylene glycol 17 g packet Commonly known as: MIRALAX / GLYCOLAX Take 17 g by mouth 2 (two) times daily. What changed:   when to take this  reasons to take this   potassium chloride SA 20 MEQ tablet Commonly known as: KLOR-CON Take 1 tablet (20 mEq total) by mouth daily.   promethazine 12.5 MG tablet Commonly known as: PHENERGAN Take 1 tablet (12.5 mg total) by mouth every 6 (six) hours as needed for nausea or vomiting.   senna 8.6 MG Tabs tablet Commonly known as: SENOKOT Take 2 tablets (17.2 mg total) by mouth 2 (two) times daily. What changed:   when to take this  reasons to take this   silver sulfADIAZINE 1 % cream Commonly known as: SILVADENE Apply 1 application topically 2 (two) times daily.   simethicone 80 MG chewable tablet Commonly known as: MYLICON Chew 1 tablet (80 mg total) by mouth 3 (three) times daily as needed for flatulence.   sucralfate 1 g tablet Commonly known as: Carafate Take 1 tablet (1 g total) by mouth 4 (four) times daily -  with meals and at bedtime. 5 min before meals for radiation induced esophagitis   SURE COMFORT INS SYR 1CC/28G 28G X 1/2" 1 ML Misc Generic drug: INS SYRINGE/NEEDLE 1CC/28G USE TO INJECT INSULIN UP TO 4 TIMES DAILY.   tiZANidine 4 MG tablet Commonly known as: Zanaflex Take 1 tablet (4 mg total) by mouth every 8 (eight) hours as needed for muscle spasms.   topiramate 25 MG tablet Commonly known as: TOPAMAX Take 75 mg by mouth at  bedtime.   trolamine salicylate 10 % cream Commonly known as: Aspercreme/Aloe Apply 1 application topically as needed for muscle pain.   umeclidinium-vilanterol 62.5-25 MCG/INH Aepb Commonly known as: ANORO ELLIPTA Inhale 1 puff into the lungs daily.      Follow-up Information    [name], MD Follow up in 1 week(s).   Specialty: Internal Medicine Contact information: [address] [place] [address] [phone]        Truitt Merle, MD Follow up in 1 week(s).   Specialties: Hematology, Oncology Contact information: [address] [name] [address] [phone]  Allergies  Allergen Reactions  . Dilaudid [Hydromorphone Hcl] Itching  . Midodrine Hcl Swelling    After one dose had anaphylactic reaction, had to call EMS.  Marland Kitchen Actifed Cold-Allergy [Chlorpheniramine-Phenylephrine]     "I was sick and red and it didn't agree with me at all"  . Doxycycline Nausea And Vomiting  . Other Cough    Pt states she is allergic to ragweed and that she starts coughing and sneezing like crazy  . Penicillins Hives and Itching    Tolerates Rocephin Has patient had a PCN reaction causing immediate rash, facial/tongue/throat swelling, SOB or lightheadedness with hypotension: Yes Has patient had a PCN reaction causing severe rash involving mucus membranes or skin necrosis: No Has patient had a PCN reaction that required hospitalization Yes Has patient had a PCN reaction occurring within the last 10 years: No If all of the above answers are "NO", then may proceed with Cephalosporin use.   . Reglan [Metoclopramide] Itching  . Valium [Diazepam] Itching  . Vistaril [Hydroxyzine Hcl] Itching    Consultations:  None   Procedures/Studies: DG Chest 2 View  Result Date: 05/19/2019 CLINICAL DATA:  Fever EXAM: CHEST - 2 VIEW COMPARISON:  04/09/2019 FINDINGS: Study is rotated. Cardiomegaly. Patchy opacity suspected in the left upper lobe peripherally.  Right lung clear. No effusions or acute bony abnormality. IMPRESSION: Concern for patchy peripheral left upper lobe airspace opacity/pneumonia. Electronically Signed   By: Rolm Baptise M.D.   On: 05/19/2019 10:46   DG Chest Port 1 View  Result Date: 06/10/2019 CLINICAL DATA:  Chest pain EXAM: PORTABLE CHEST 1 VIEW COMPARISON:  05/19/2019 FINDINGS: Previously seen patchy opacity in the left upper lobe has improved with mild residual density. Right lung clear. Heart borderline in size. No effusions or acute bony abnormality. IMPRESSION: Minimal residual left upper lobe opacity, improving since prior study. Electronically Signed   By: Rolm Baptise M.D.   On: 06/10/2019 01:04     Discharge Exam: Vitals:   06/11/19 0549 06/11/19 0805  BP: (!) 118/58   Pulse: 68   Resp: 18   Temp: 98.6 F (37 C)   SpO2: 100% 98%   Vitals:   06/11/19 0319 06/11/19 0355 06/11/19 0549 06/11/19 0805  BP: (!) 103/53 (!) 109/32 (!) 118/58   Pulse: 68 68 68   Resp: 17 16 18    Temp: 98.1 F (36.7 C) 98.8 F (37.1 C) 98.6 F (37 C)   TempSrc: Oral Oral Oral   SpO2: 100% 100% 100% 98%  Weight:      Height:        General: Pt is alert, awake, not in acute distress Cardiovascular: RRR, S1/S2 +, no rubs, no gallops Respiratory: CTA bilaterally, no wheezing, no rhonchi, currently on nasal cannula oxygen Abdominal: Soft, NT, ND, bowel sounds + Extremities: no edema, no cyanosis    The results of significant diagnostics from this hospitalization (including imaging, microbiology, ancillary and laboratory) are listed below for reference.     Microbiology: Recent Results (from the past 240 hour(s))  Respiratory Panel by RT PCR (Flu A&B, Covid) - Nasopharyngeal Swab     Status: None   Collection Time: 06/10/19  1:38 AM   Specimen: Nasopharyngeal Swab  Result Value Ref Range Status   SARS Coronavirus 2 by RT PCR NEGATIVE NEGATIVE Final    Comment: (NOTE) SARS-CoV-2 target nucleic acids are NOT DETECTED. The  SARS-CoV-2 RNA is generally detectable in upper respiratoy specimens during the acute phase of infection. The lowest concentration  of SARS-CoV-2 viral copies this assay can detect is 131 copies/mL. A negative result does not preclude SARS-Cov-2 infection and should not be used as the sole basis for treatment or other patient management decisions. A negative result may occur with  improper specimen collection/handling, submission of specimen other than nasopharyngeal swab, presence of viral mutation(s) within the areas targeted by this assay, and inadequate number of viral copies (<131 copies/mL). A negative result must be combined with clinical observations, patient history, and epidemiological information. The expected result is Negative. Fact Sheet for Patients:  PinkCheek.be Fact Sheet for Healthcare Providers:  GravelBags.it This test is not yet ap proved or cleared by the [place] FDA and  has been authorized for detection and/or diagnosis of SARS-CoV-2 by FDA under an Emergency Use Authorization (EUA). This EUA will remain  in effect (meaning this test can be used) for the duration of the COVID-19 declaration under Section 564(b)(1) of the Act, 21 U.S.C. section 360bbb-3(b)(1), unless the authorization is terminated or revoked sooner.    Influenza A by PCR NEGATIVE NEGATIVE Final   Influenza B by PCR NEGATIVE NEGATIVE Final    Comment: (NOTE) The Xpert Xpress SARS-CoV-2/FLU/RSV assay is intended as an aid in  the diagnosis of influenza from Nasopharyngeal swab specimens and  should not be used as a sole basis for treatment. Nasal washings and  aspirates are unacceptable for Xpert Xpress SARS-CoV-2/FLU/RSV  testing. Fact Sheet for Patients: PinkCheek.be Fact Sheet for Healthcare Providers: GravelBags.it This test is not yet approved or cleared by the [place] FDA and  has been authorized for detection and/or diagnosis of SARS-CoV-2 by  FDA under an Emergency Use Authorization (EUA). This EUA will remain  in effect (meaning this test can be used) for the duration of the  Covid-19 declaration under Section 564(b)(1) of the Act, 21  U.S.C. section 360bbb-3(b)(1), unless the authorization is  terminated or revoked. Performed at [facility], [address] [place], [place] [address]      Labs: BNP (last 3 results) Recent Labs    10/13/18 0538 04/10/19 0018 05/19/19 0640  BNP 473.4* 76.9 500.9*   Basic Metabolic Panel: Recent Labs  Lab 06/10/19 0029 06/10/19 0238 06/11/19 0629  NA 135 134* 136  K 3.9 3.9 3.8  CL 101 100 98  CO2 26 25 28   GLUCOSE 214* 182* 284*  BUN 36* 37* 33*  CREATININE 1.78* 1.78* 1.31*  CALCIUM 8.5* 8.5* 8.7*   Liver Function Tests: No results for input(s): AST, ALT, ALKPHOS, BILITOT, PROT, ALBUMIN in the last 168 hours. No results for input(s): LIPASE, AMYLASE in the last 168 hours. No results for input(s): AMMONIA in the last 168 hours. CBC: Recent Labs  Lab 06/10/19 0029 06/10/19 0238 06/11/19 0629  WBC 5.6 6.4 5.0  NEUTROABS 4.2  --   --   HGB 4.1* 4.4* 7.8*  HCT 14.0* 15.1* 25.6*  MCV 103.7* 103.4* 98.8  PLT 250 257 246   Cardiac Enzymes: No results for input(s): CKTOTAL, CKMB, CKMBINDEX, TROPONINI in the last 168 hours. BNP: Invalid input(s): POCBNP CBG: Recent Labs  Lab 06/10/19 0606 06/10/19 1236 06/10/19 1604 06/10/19 2102  GLUCAP 154* 134* 139* 225*   D-Dimer No results for input(s): DDIMER in the last 72 hours. Hgb A1c No results for input(s): HGBA1C in the last 72 hours. Lipid Profile No results for input(s): CHOL, HDL, LDLCALC, TRIG, CHOLHDL, LDLDIRECT in the last 72 hours. Thyroid function studies No results for input(s): TSH, T4TOTAL, T3FREE, THYROIDAB in  the last 72 hours.  Invalid input(s): FREET3 Anemia work up No results for input(s): VITAMINB12, FOLATE,  FERRITIN, TIBC, IRON, RETICCTPCT in the last 72 hours. Urinalysis    Component Value Date/Time   COLORURINE YELLOW 05/19/2019 [place] 05/19/2019 0851   LABSPEC 1.013 05/19/2019 0851   PHURINE 5.0 05/19/2019 0851   GLUCOSEU NEGATIVE 05/19/2019 0851   HGBUR LARGE (A) 05/19/2019 0851   BILIRUBINUR NEGATIVE 05/19/2019 0851   KETONESUR 5 (A) 05/19/2019 0851   PROTEINUR NEGATIVE 05/19/2019 0851   UROBILINOGEN 0.2 12/04/2014 0650   NITRITE NEGATIVE 05/19/2019 0851   LEUKOCYTESUR NEGATIVE 05/19/2019 0851   Sepsis Labs Invalid input(s): PROCALCITONIN,  WBC,  LACTICIDVEN Microbiology Recent Results (from the past 240 hour(s))  Respiratory Panel by RT PCR (Flu A&B, Covid) - Nasopharyngeal Swab     Status: None   Collection Time: 06/10/19  1:38 AM   Specimen: Nasopharyngeal Swab  Result Value Ref Range Status   SARS Coronavirus 2 by RT PCR NEGATIVE NEGATIVE Final    Comment: (NOTE) SARS-CoV-2 target nucleic acids are NOT DETECTED. The SARS-CoV-2 RNA is generally detectable in upper respiratoy specimens during the acute phase of infection. The lowest concentration of SARS-CoV-2 viral copies this assay can detect is 131 copies/mL. A negative result does not preclude SARS-Cov-2 infection and should not be used as the sole basis for treatment or other patient management decisions. A negative result may occur with  improper specimen collection/handling, submission of specimen other than nasopharyngeal swab, presence of viral mutation(s) within the areas targeted by this assay, and inadequate number of viral copies (<131 copies/mL). A negative result must be combined with clinical observations, patient history, and epidemiological information. The expected result is Negative. Fact Sheet for Patients:  PinkCheek.be Fact Sheet for Healthcare Providers:  GravelBags.it This test is not yet ap proved or cleared by the  [place] FDA and  has been authorized for detection and/or diagnosis of SARS-CoV-2 by FDA under an Emergency Use Authorization (EUA). This EUA will remain  in effect (meaning this test can be used) for the duration of the COVID-19 declaration under Section 564(b)(1) of the Act, 21 U.S.C. section 360bbb-3(b)(1), unless the authorization is terminated or revoked sooner.    Influenza A by PCR NEGATIVE NEGATIVE Final   Influenza B by PCR NEGATIVE NEGATIVE Final    Comment: (NOTE) The Xpert Xpress SARS-CoV-2/FLU/RSV assay is intended as an aid in  the diagnosis of influenza from Nasopharyngeal swab specimens and  should not be used as a sole basis for treatment. Nasal washings and  aspirates are unacceptable for Xpert Xpress SARS-CoV-2/FLU/RSV  testing. Fact Sheet for Patients: PinkCheek.be Fact Sheet for Healthcare Providers: GravelBags.it This test is not yet approved or cleared by the [place] FDA and  has been authorized for detection and/or diagnosis of SARS-CoV-2 by  FDA under an Emergency Use Authorization (EUA). This EUA will remain  in effect (meaning this test can be used) for the duration of the  Covid-19 declaration under Section 564(b)(1) of the Act, 21  U.S.C. section 360bbb-3(b)(1), unless the authorization is  terminated or revoked. Performed at [facility], [address], [place], [place] [address]      Time coordinating discharge: 35 minutes  SIGNED:   Rodena Goldmann, DO [facility] 06/11/2019, 9:30 AM  If 7PM-7AM, please contact night-coverage [email or web address]

## 2019-06-11 NOTE — Progress Notes (Signed)
UNMATCHED BLOOD PRODUCT NOTE  Compare the patient ID on the blood tag to the patient ID on the hospital armband and Blood Bank armband. Then confirm the unit number on the blood tag matches the unit number on the blood product.  If a discrepancy is discovered return the product to blood bank immediately.   Blood Product Type: Packed Red Cells Unit #: [id number] Product Code #: [id number]  Start Time:0340 Starting Rate:140ml/hr  Rate increase/decreased  (if applicable): 007     ml/hr  Rate changed time (if applicable): 1219   Stop Time: 0530   All Other Documentation should be documented within the Blood Admin Flowsheet per policy.

## 2019-06-11 NOTE — NC FL2 (Signed)
[place] LEVEL OF CARE SCREENING TOOL     IDENTIFICATION  Patient Name: Stephanie Combs Birthdate: Nov 21, 1950 Sex: female Admission Date (Current Location): 06/09/2019  [facility] and [place] Number:  [employer] and Address:  [place] [address], [place]      Provider Number: [id number]  Attending Physician Name and Address:  Rodena Goldmann, DO  Relative Name and Phone Number:       Current Level of Care: Hospital Recommended Level of Care: [place] Prior Approval Number:    Date Approved/Denied:   PASRR Number:    Discharge Plan: SNF    Current Diagnoses: Patient Active Problem List   Diagnosis Date Noted  . GI bleed 06/10/2019  . Goals of care, counseling/discussion   . Advanced care planning/counseling discussion   . Palliative care by specialist   . Do not resuscitate   . Anemia due to GI blood loss 04/25/2019  . Pressure injury of skin 04/12/2019  . Fluid overload 04/10/2019  . Lung cancer ([place]) 04/10/2019  . Acute encephalopathy 04/02/2019  . Secondary malignant neoplasm of brain ([place]) 03/09/2019  . Acute on chronic diastolic heart failure ([place]) 10/11/2018  . Assistance needed with transportation 09/22/2018  . 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 Small cell lung cancer, left upper lobe ([place])   . Colonic mass 09/10/2018  . Colon polyps 09/10/2018  . Cellulitis of leg, left 09/10/2018  . Pain of lower extremity   . Non-intractable vomiting   . Abdominal pain, epigastric   . Heme positive stool   . Acute on chronic diastolic CHF (congestive heart failure) ([place]) 09/06/2018  . Symptomatic anemia 08/31/2018  . Anemia 08/30/2018  . Chronic pain 07/05/2018  . On home O2 07/05/2018  . Opioid dependence ([place]) 07/05/2018  . CAD (coronary artery disease) 05/14/2018  . AKI (acute kidney injury) ([place]) 04/05/2018  . Cellulitis 01/10/2018  . Cellulitis of right lower extremity  01/07/2018  . Altered mental status 01/06/2018  . Type 2 diabetes mellitus with hypoglycemia without coma ([place]) 11/24/2017  . Anxiety 11/24/2017  . Chronic respiratory failure with hypoxia ([place]) 11/24/2017  . Syncope 10/24/2017  . Bradycardia   . Syncope due to orthostatic hypotension 09/04/2017  . Obesity, Class III, BMI 40-49.9 (morbid obesity) ([place]) 09/04/2017  . Abnormal nuclear stress test 05/19/2017  . Atypical angina ([place]) 05/19/2017  . Edema 05/15/2017  . Skin ulcer ([place]) 01/30/2017  . Tinea cruris 10/10/2016  . Vitamin D deficiency 08/19/2016  . Personal history of noncompliance with medical treatment, presenting hazards to health 06/17/2016  . CAP (community acquired pneumonia) 07/25/2015  . Pressure ulcer 05/02/2015  . Hyponatremia 05/01/2015  . Acute-on-chronic kidney injury ([place]) 05/01/2015  . Uncontrolled type 2 diabetes mellitus with stage 3 chronic kidney disease ([place]) 05/01/2015  . Cellulitis of foot, right 03/24/2015  . Peripheral edema 12/04/2014  . Acute renal failure superimposed on stage 3 chronic kidney disease ([place]) 11/24/2014  . Bilateral lower extremity edema   . Diabetic ulcer of right great toe ([place])   . Foot ulcer due to secondary DM ([place]) 11/07/2014  . Diabetic ulcer of right foot ([place]) 11/07/2014  . Edema of lower extremity 05/27/2013  . CKD (chronic kidney disease), stage III ([place]) 04/14/2013  . Anemia in CKD (chronic kidney disease) 04/14/2013  . Chest pain 04/14/2013  . Dyspnea 04/14/2013  . Chronic diastolic CHF (congestive heart failure) ([place])   . Hypothyroidism   .  COPD (chronic obstructive pulmonary disease) ([place]) 08/07/2012  . Morbid obesity ([place]) 08/07/2012  . DM type 2 (diabetes mellitus, type 2) ([place]) 08/07/2012  . HTN (hypertension), benign 08/07/2012  . ARF (acute renal failure) ([place]) 08/07/2012    Orientation RESPIRATION BLADDER Height & Weight     Self, Time, Situation, Place    Incontinent Weight: 113.4 kg Height:  5\' 6"   (167.6 cm)  BEHAVIORAL SYMPTOMS/MOOD NEUROLOGICAL BOWEL NUTRITION STATUS      Continent    AMBULATORY STATUS COMMUNICATION OF NEEDS Skin   Extensive Assist Verbally Normal                       Personal Care Assistance Level of Assistance  Bathing, Feeding, Dressing Bathing Assistance: Limited assistance Feeding assistance: Independent Dressing Assistance: Limited assistance     Functional Limitations Info  Sight, Hearing, Speech Sight Info: Adequate Hearing Info: Adequate Speech Info: Adequate    SPECIAL CARE FACTORS FREQUENCY  PT (By licensed PT)     PT Frequency: 5x/week OT Frequency: 3x/week            Contractures      Additional Factors Info  Code Status, Allergies, Psychotropic Code Status Info: DNR Allergies Info: Dilaudid, Midrodrine, Doxycycline, other-ragweed,  penicillins, regaln, valium, vistaril, actifed Psychotropic Info: buspar, xanax         Current Medications (06/11/2019):  This is the current hospital active medication list Current Facility-Administered Medications  Medication Dose Route Frequency Provider Last Rate Last Admin  . 0.9 %  sodium chloride infusion  250 mL Intravenous PRN Lynetta Mare, MD      . acetaminophen (TYLENOL) tablet 650 mg  650 mg Oral Q6H PRN Lynetta Mare, MD       Or  . acetaminophen (TYLENOL) suppository 650 mg  650 mg Rectal Q6H PRN Lynetta Mare, MD      . albuterol (PROVENTIL) (2.5 MG/3ML) 0.083% nebulizer solution 2.5 mg  2.5 mg Nebulization Q4H PRN Lynetta Mare, MD   2.5 mg at 06/11/19 0805  . allopurinol (ZYLOPRIM) tablet 300 mg  300 mg Oral Daily Lynetta Mare, MD   300 mg at 06/10/19 1629  . ALPRAZolam Duanne Moron) tablet 1 mg  1 mg Oral TID PRN Lynetta Mare, MD   1 mg at 06/10/19 1628  . atorvastatin (LIPITOR) tablet 80 mg  80 mg Oral q1800 Lynetta Mare, MD   80 mg at 06/10/19 1629  . bisacodyl (DULCOLAX) suppository 10 mg  10 mg Rectal Daily PRN Lynetta Mare, MD      .  busPIRone (BUSPAR) tablet 7.5 mg  7.5 mg Oral BID Lynetta Mare, MD   7.5 mg at 06/10/19 2135  . cholecalciferol (VITAMIN D3) tablet 5,000 Units  5,000 Units Oral Daily Lynetta Mare, MD   5,000 Units at 06/10/19 1629  . feeding supplement (ENSURE ENLIVE) (ENSURE ENLIVE) liquid 237 mL  237 mL Oral BID BM Lynetta Mare, MD      . feeding supplement (PRO-STAT SUGAR FREE 64) liquid 30 mL  30 mL Oral Daily Lynetta Mare, MD      . furosemide (LASIX) tablet 40 mg  40 mg Oral Daily Lynetta Mare, MD   40 mg at 06/10/19 1140  . gabapentin (NEURONTIN) capsule 600 mg  600 mg Oral TID Lynetta Mare, MD   600 mg at 06/10/19 2134  . hydrocortisone (ANUSOL-HC) 2.5 % rectal cream 1 application  1  application Rectal QID PRN Lynetta Mare, MD      . insulin aspart (novoLOG) injection 0-6 Units  0-6 Units Subcutaneous Q6H Lynetta Mare, MD   2 Units at 06/11/19 0007  . levothyroxine (SYNTHROID) tablet 200 mcg  200 mcg Oral QAC breakfast Lynetta Mare, MD   200 mcg at 06/11/19 0500  . multivitamin with minerals tablet 1 tablet  1 tablet Oral Daily Lynetta Mare, MD   1 tablet at 06/10/19 1140  . omega-3 acid ethyl esters (LOVAZA) capsule 1 g  1 g Oral Daily Lynetta Mare, MD   1 g at 06/10/19 1629  . ondansetron (ZOFRAN) tablet 4 mg  4 mg Oral Q6H PRN Lynetta Mare, MD       Or  . ondansetron [facility]) injection 4 mg  4 mg Intravenous Q6H PRN Lynetta Mare, MD      . oxyCODONE (Oxy IR/ROXICODONE) immediate release tablet 5 mg  5 mg Oral Q4H PRN Manuella Ghazi, Pratik D, DO   5 mg at 06/10/19 2138  . polyethylene glycol (MIRALAX / GLYCOLAX) packet 17 g  17 g Oral Daily PRN Lynetta Mare, MD      . potassium chloride SA (KLOR-CON) CR tablet 20 mEq  20 mEq Oral Daily Lynetta Mare, MD   20 mEq at 06/10/19 1141  . senna (SENOKOT) tablet 17.2 mg  2 tablet Oral BID Lynetta Mare, MD   17.2 mg at 06/10/19 2134  . silver sulfADIAZINE (SILVADENE) 1 % cream 1 application  1  application Topical BID Lynetta Mare, MD   1 application at 09/62/83 2135  . simethicone (MYLICON) chewable tablet 80 mg  80 mg Oral TID PRN Lynetta Mare, MD      . sodium chloride flush (NS) 0.9 % injection 3 mL  3 mL Intravenous Q12H Lynetta Mare, MD   3 mL at 06/10/19 2135  . sodium chloride flush (NS) 0.9 % injection 3 mL  3 mL Intravenous PRN Lynetta Mare, MD      . sucralfate (CARAFATE) tablet 1 g  1 g Oral TID WC & HS Lynetta Mare, MD   1 g at 06/10/19 2134  . tiZANidine (ZANAFLEX) tablet 4 mg  4 mg Oral Q8H PRN Lynetta Mare, MD      . topiramate (TOPAMAX) tablet 75 mg  75 mg Oral QHS Lynetta Mare, MD   75 mg at 06/10/19 2134  . umeclidinium-vilanterol (ANORO ELLIPTA) 62.5-25 MCG/INH 1 puff  1 puff Inhalation Daily Lynetta Mare, MD   1 puff at 06/10/19 1041     Discharge Medications: Please see discharge summary for a list of discharge medications.  Relevant Imaging Results:  Relevant Lab Results:   Additional Information    [place] Nunley, Chauncey Reading, RN

## 2019-06-12 LAB — TYPE AND SCREEN
ABO/RH(D): A POS
Antibody Screen: NEGATIVE
Donor AG Type: NEGATIVE
Donor AG Type: NEGATIVE
Donor AG Type: NEGATIVE
Unit division: 0
Unit division: 0
Unit division: 0

## 2019-06-12 LAB — BPAM RBC
Blood Product Expiration Date: 202102272359
Blood Product Expiration Date: 202103082359
Blood Product Expiration Date: 202103092359
ISSUE DATE / TIME: 202101290740
ISSUE DATE / TIME: 202101291540
ISSUE DATE / TIME: 202101300310
Unit Type and Rh: 5100
Unit Type and Rh: 5100
Unit Type and Rh: 5100

## 2019-06-13 ENCOUNTER — Telehealth: Payer: Self-pay

## 2019-06-13 LAB — GLUCOSE, CAPILLARY: Glucose-Capillary: 248 mg/dL — ABNORMAL HIGH (ref 70–99)

## 2019-06-13 NOTE — Telephone Encounter (Signed)
SW attempted to contact daughter, [name] to discuss palliative care support while patient is at [facility]. SW left VM with contact information.

## 2019-06-14 ENCOUNTER — Emergency Department (HOSPITAL_COMMUNITY)
Admission: EM | Admit: 2019-06-14 | Discharge: 2019-06-14 | Disposition: A | Discharge status: Skilled Nursing Facility | Payer: Medicare Other | Attending: Emergency Medicine | Admitting: Emergency Medicine

## 2019-06-14 ENCOUNTER — Encounter (HOSPITAL_COMMUNITY): Payer: Self-pay | Admitting: *Deleted

## 2019-06-14 DIAGNOSIS — N183 Chronic kidney disease, stage 3 unspecified: Secondary | ICD-10-CM | POA: Insufficient documentation

## 2019-06-14 DIAGNOSIS — K625 Hemorrhage of anus and rectum: Secondary | ICD-10-CM | POA: Diagnosis not present

## 2019-06-14 DIAGNOSIS — I5032 Chronic diastolic (congestive) heart failure: Secondary | ICD-10-CM | POA: Diagnosis not present

## 2019-06-14 DIAGNOSIS — Z79899 Other long term (current) drug therapy: Secondary | ICD-10-CM | POA: Diagnosis not present

## 2019-06-14 DIAGNOSIS — E1122 Type 2 diabetes mellitus with diabetic chronic kidney disease: Secondary | ICD-10-CM | POA: Insufficient documentation

## 2019-06-14 DIAGNOSIS — C189 Malignant neoplasm of colon, unspecified: Secondary | ICD-10-CM | POA: Insufficient documentation

## 2019-06-14 DIAGNOSIS — I130 Hypertensive heart and chronic kidney disease with heart failure and stage 1 through stage 4 chronic kidney disease, or unspecified chronic kidney disease: Secondary | ICD-10-CM | POA: Insufficient documentation

## 2019-06-14 DIAGNOSIS — Z794 Long term (current) use of insulin: Secondary | ICD-10-CM | POA: Diagnosis not present

## 2019-06-14 LAB — COMPREHENSIVE METABOLIC PANEL
ALT: 9 U/L (ref 0–44)
AST: 16 U/L (ref 15–41)
Albumin: 2.8 g/dL — ABNORMAL LOW (ref 3.5–5.0)
Alkaline Phosphatase: 70 U/L (ref 38–126)
Anion gap: 8 (ref 5–15)
BUN: 28 mg/dL — ABNORMAL HIGH (ref 8–23)
CO2: 30 mmol/L (ref 22–32)
Calcium: 8.9 mg/dL (ref 8.9–10.3)
Chloride: 100 mmol/L (ref 98–111)
Creatinine, Ser: 1.31 mg/dL — ABNORMAL HIGH (ref 0.44–1.00)
GFR calc Af Amer: 48 mL/min — ABNORMAL LOW (ref >60)
GFR calc non Af Amer: 41 mL/min — ABNORMAL LOW (ref >60)
Glucose, Bld: 170 mg/dL — ABNORMAL HIGH (ref 70–99)
Potassium: 3.9 mmol/L (ref 3.5–5.1)
Sodium: 138 mmol/L (ref 135–145)
Total Bilirubin: 0.4 mg/dL (ref 0.3–1.2)
Total Protein: 5.3 g/dL — ABNORMAL LOW (ref 6.5–8.1)

## 2019-06-14 LAB — CBC WITH DIFFERENTIAL/PLATELET
Abs Immature Granulocytes: 0.02 10*3/uL (ref 0.00–0.07)
Basophils Absolute: 0.1 10*3/uL (ref 0.0–0.1)
Basophils Relative: 1 %
Eosinophils Absolute: 0.2 10*3/uL (ref 0.0–0.5)
Eosinophils Relative: 4 %
HCT: 25.2 % — ABNORMAL LOW (ref 36.0–46.0)
Hemoglobin: 7.6 g/dL — ABNORMAL LOW (ref 12.0–15.0)
Immature Granulocytes: 0 %
Lymphocytes Relative: 10 %
Lymphs Abs: 0.6 10*3/uL — ABNORMAL LOW (ref 0.7–4.0)
MCH: 29.8 pg (ref 26.0–34.0)
MCHC: 30.2 g/dL (ref 30.0–36.0)
MCV: 98.8 fL (ref 80.0–100.0)
Monocytes Absolute: 0.3 10*3/uL (ref 0.1–1.0)
Monocytes Relative: 6 %
Neutro Abs: 4.7 10*3/uL (ref 1.7–7.7)
Neutrophils Relative %: 79 %
Platelets: 234 10*3/uL (ref 150–400)
RBC: 2.55 MIL/uL — ABNORMAL LOW (ref 3.87–5.11)
RDW: 16.4 % — ABNORMAL HIGH (ref 11.5–15.5)
WBC: 5.9 10*3/uL (ref 4.0–10.5)
nRBC: 0 % (ref 0.0–0.2)

## 2019-06-14 LAB — TYPE AND SCREEN
ABO/RH(D): A POS
Antibody Screen: NEGATIVE

## 2019-06-14 LAB — PROTIME-INR
INR: 0.9 (ref 0.8–1.2)
Prothrombin Time: 12.5 seconds (ref 11.4–15.2)

## 2019-06-14 LAB — APTT: aPTT: 24 seconds (ref 24–36)

## 2019-06-14 MED ORDER — SODIUM CHLORIDE 0.9 % IV SOLN: INTRAVENOUS | Status: DC

## 2019-06-14 MED ORDER — SODIUM CHLORIDE 0.9 % IV BOLUS
500.0000 mL | Freq: Once | INTRAVENOUS | Status: AC
  Administered 2019-06-14: 18:00:00 500 mL via INTRAVENOUS

## 2019-06-14 NOTE — ED Notes (Signed)
Attempted to call report to [facility], this RN on hold for 5 minutes with no answer, attempted to call back with no answer, Vivien Rota, RN aware

## 2019-06-14 NOTE — ED Notes (Signed)
This RN called for transportation to [facility], per [facility] the pt will need to be transported back via EMS

## 2019-06-14 NOTE — ED Provider Notes (Signed)
[facility] Provider Note   CSN: [id number] Arrival date & time: 06/14/19  1531     History Chief Complaint  Patient presents with  . Rectal Bleeding    Stephanie Combs is a 68 y.o. female.  HPI   Patient presented to the emergency room for evaluation of rectal bleeding.  Patient unfortunately has a complicated medical history.  She has a history of colon cancer that is not amenable to surgical intervention.  Patient is not undergoing any active chemotherapy as she is too weak for treatment.  According to the medical records from her last hospitalization when she was admitted on January 28, the patient has had acute on chronic anemia.  She was given a blood transfusion during her last hospitalization.  Palliative care was recommended but the patient is not interested at this time.  Patient was discharged back to the nursing facility on January 30.  Patient states she started having active rectal bleeding again at the nursing facility.  They sent her to the ED for further evaluation.  Past Medical History:  Diagnosis Date  . Anxiety   . CAD (coronary artery disease)    a. s/p DES to LAD and angioplasty to D1 in 05/2017  . Cancer ([place])   . CHF (congestive heart failure) (HCC)    Diastolic  . Chronic back pain   . Chronic pain   . COPD (chronic obstructive pulmonary disease) ([place])   . Degenerative disk disease   . Diabetes mellitus without complication ([place])   . History of medication noncompliance 11/2017   "the patient frequently self-adjusts medications without notifying her physicians"  . Hypertension   . Hypothyroidism   . On home O2    2.5 L N/C prn  . Pedal edema     Patient Active Problem List   Diagnosis Date Noted  . Acute on chronic anemia 06/11/2019  . GI bleed 06/10/2019  . Goals of care, counseling/discussion   . Advanced care planning/counseling discussion   . Palliative care by specialist   . Do not resuscitate   . Anemia due to GI  blood loss 04/25/2019  . Pressure injury of skin 04/12/2019  . Fluid overload 04/10/2019  . Lung cancer ([place]) 04/10/2019  . Acute encephalopathy 04/02/2019  . 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 Acute on chronic diastolic heart failure ([place]) 10/11/2018  . Assistance needed with transportation 09/22/2018  . Primary cancer of hepatic flexure of colon ([place])   . Small cell lung cancer, left upper lobe ([place])   . Colonic mass 09/10/2018  . Colon polyps 09/10/2018  . Cellulitis of leg, left 09/10/2018  . Pain of lower extremity   . Non-intractable vomiting   . Abdominal pain, epigastric   . Heme positive stool   . Acute on chronic diastolic CHF (congestive heart failure) ([place]) 09/06/2018  . Symptomatic anemia 08/31/2018  . Anemia 08/30/2018  . Chronic pain 07/05/2018  . On home O2 07/05/2018  . Opioid dependence ([place]) 07/05/2018  . CAD (coronary artery disease) 05/14/2018  . AKI (acute kidney injury) ([place]) 04/05/2018  . Cellulitis 01/10/2018  . Cellulitis of right lower extremity 01/07/2018  . Altered mental status 01/06/2018  . Type 2 diabetes mellitus with hypoglycemia without coma ([place]) 11/24/2017  . Anxiety 11/24/2017  . Chronic respiratory failure with hypoxia ([place]) 11/24/2017  . Syncope 10/24/2017  . Bradycardia   . Syncope due to orthostatic hypotension 09/04/2017  . Obesity, Class III, BMI 40-49.9 (morbid obesity) (  [place]) 09/04/2017  . Abnormal nuclear stress test 05/19/2017  . Atypical angina ([place]) 05/19/2017  . Edema 05/15/2017  . Skin ulcer ([place]) 01/30/2017  . Tinea cruris 10/10/2016  . Vitamin D deficiency 08/19/2016  . Personal history of noncompliance with medical treatment, presenting hazards to health 06/17/2016  . CAP (community acquired pneumonia) 07/25/2015  . Pressure ulcer 05/02/2015  . Hyponatremia 05/01/2015  . Acute-on-chronic kidney injury ([place]) 05/01/2015  . Uncontrolled type 2 diabetes mellitus with stage 3 chronic kidney disease  ([place]) 05/01/2015  . Cellulitis of foot, right 03/24/2015  . Peripheral edema 12/04/2014  . Acute renal failure superimposed on stage 3 chronic kidney disease ([place]) 11/24/2014  . Bilateral lower extremity edema   . Diabetic ulcer of right great toe ([place])   . Foot ulcer due to secondary DM ([place]) 11/07/2014  . Diabetic ulcer of right foot ([place]) 11/07/2014  . Edema of lower extremity 05/27/2013  . CKD (chronic kidney disease), stage III ([place]) 04/14/2013  . Anemia in CKD (chronic kidney disease) 04/14/2013  . Chest pain 04/14/2013  . Dyspnea 04/14/2013  . Chronic diastolic CHF (congestive heart failure) ([place])   . Hypothyroidism   . COPD (chronic obstructive pulmonary disease) ([place]) 08/07/2012  . Morbid obesity ([place]) 08/07/2012  . DM type 2 (diabetes mellitus, type 2) ([place]) 08/07/2012  . HTN (hypertension), benign 08/07/2012  . ARF (acute renal failure) ([place]) 08/07/2012    Past Surgical History:  Procedure Laterality Date  . BIOPSY  09/09/2018   Procedure: BIOPSY;  Surgeon: Daneil Dolin, MD;  [facility];  Service: Endoscopy;;  gastric esophageal hepatic flexure colon  . COLONOSCOPY WITH PROPOFOL N/A 09/09/2018   hepatic flexure colon mass s/p biopsy, with polyps associated with tumor scattered all the way to the cecum not removed, multiple splenic flexure and rectal polyps removed, left-sided diverticulosis, ?right hemicolectomy. Hepatic flexure adenocarcinoma and additional adenocarcinoma in small splenic flexure polyp. Concern for synchronous colon cancer. High risk for surgical resection.   . CORONARY STENT INTERVENTION N/A 05/22/2017   Procedure: CORONARY STENT INTERVENTION;  Surgeon: Martinique, Peter M, MD;  Location: [facility];  Service: Cardiovascular;  Laterality: N/A;  . ESOPHAGOGASTRODUODENOSCOPY (EGD) WITH PROPOFOL N/A 09/09/2018   abnormal distal esophagus suspicious for Barrett's s/p biopsy, erosive gastropathy, duodenal bulbar erosions. +Barrett's  . HERNIA  REPAIR    . POLYPECTOMY  09/09/2018   Procedure: POLYPECTOMY;  Surgeon: Daneil Dolin, MD;  [facility];  Service: Endoscopy;;  colon  . RIGHT/LEFT HEART CATH AND CORONARY ANGIOGRAPHY N/A 05/19/2017   Procedure: RIGHT/LEFT HEART CATH AND CORONARY ANGIOGRAPHY;  Surgeon: Leonie Man, MD;  Location: [facility];  Service: Cardiovascular;  Laterality: N/A;  . TUBAL LIGATION    . VIDEO BRONCHOSCOPY WITH ENDOBRONCHIAL ULTRASOUND Left 09/14/2018   Procedure: VIDEO BRONCHOSCOPY WITH ENDOBRONCHIAL ULTRASOUND with biopsies;  Surgeon: Margaretha Seeds, MD;  Location: [facility];  Service: Thoracic;  Laterality: Left;     OB History   No obstetric history on file.     Family History  Problem Relation Age of Onset  . Stroke Mother        deceased 29   . Heart attack Father        deceased 3   . Diabetes Brother   . Cerebral palsy Brother   . Pneumonia Brother   . Diabetes Other   . Heart attack Other   . Colon cancer Neg Hx     Social History  Tobacco Use  . Smoking status: Former Smoker    Packs/day: 1.50    Years: 45.00    Pack years: 67.50    Types: Cigarettes    Quit date: 11/24/2014    Years since quitting: 4.5  . Smokeless tobacco: Never Used  Substance Use Topics  . Alcohol use: No    Alcohol/week: 0.0 standard drinks  . Drug use: No    Home Medications Prior to Admission medications   Medication Sig Start Date End Date Taking? Authorizing Provider  albuterol (PROAIR HFA) 108 (90 Base) MCG/ACT inhaler Inhale 2 puffs into the lungs every 4 (four) hours as needed for wheezing or shortness of breath. For shortness of breath 09/22/18   Eugenie Filler, MD  albuterol (PROVENTIL) (2.5 MG/3ML) 0.083% nebulizer solution Take 2.5 mg by nebulization 2 (two) times daily as needed for wheezing or shortness of breath.     [provider]  allopurinol (ZYLOPRIM) 300 MG tablet Take 300 mg by mouth daily.    [provider]  ALPRAZolam Duanne Moron) 1 MG  tablet Take 1 tablet (1 mg total) by mouth 3 (three) times daily as needed for anxiety or sleep. 06/11/19   Manuella Ghazi, Pratik D, DO  Amino Acids-Protein Hydrolys (FEEDING SUPPLEMENT, PRO-STAT SUGAR FREE 64,) LIQD Take 30 mLs by mouth daily. 05/24/19   Maryanna Shape, NP  aspirin EC 81 MG tablet Take 81 mg by mouth daily.    [provider]  atorvastatin (LIPITOR) 80 MG tablet Take 1 tablet (80 mg total) by mouth daily at 6 PM. 05/26/17   [name], MD  bacitracin 500 UNIT/GM ointment Apply 1 application topically 3 (three) times daily.    [provider]  busPIRone (BUSPAR) 7.5 MG tablet Take 1 tablet (7.5 mg total) by mouth 2 (two) times daily. 06/11/19   Manuella Ghazi, Pratik D, DO  cholecalciferol (VITAMIN D3) 25 MCG (1000 UT) tablet Take 5,000 Units by mouth daily.    [provider]  feeding supplement, ENSURE ENLIVE, (ENSURE ENLIVE) LIQD Take 237 mLs by mouth 2 (two) times daily between meals. 05/24/19   Maryanna Shape, NP  furosemide (LASIX) 40 MG tablet Take 1 tablet (40 mg total) by mouth daily. 04/28/19   Roxan Hockey, MD  gabapentin (NEURONTIN) 600 MG tablet Take 600 mg by mouth 3 (three) times daily.     [provider]  hydrocortisone (ANUSOL-HC) 2.5 % rectal cream Place 1 application rectally 4 (four) times daily as needed for hemorrhoids.  01/03/19   [provider]  insulin aspart (NOVOLOG) 100 UNIT/ML injection Inject 0-9 Units into the skin See admin instructions. insulin aspart (novoLOG) injection 0-9 Units 0-9 Units,  Subcutaneous, 3 times daily with meals, CBG < 70: Implement Hypoglycemia Standing Orders and refer to Hypoglycemia Standing Orders sidebar report  CBG 70 - 120: 0 units  CBG 121 - 150: 1 unit  CBG 151 - 200: 2 units  CBG 201 - 250: 3 units  CBG 251 - 300: 5 units  CBG 301 - 350: 7 units  CBG 351 - 400: 9 units CBG > 400: Patient not taking: Reported on 06/11/2019 04/28/19   Roxan Hockey, MD  insulin detemir (LEVEMIR) 100  UNIT/ML injection Inject 0.34 mLs (34 Units total) into the skin at bedtime. Patient not taking: Reported on 06/11/2019 05/24/19   Maryanna Shape, NP  Insulin Glargine (BASAGLAR KWIKPEN) 100 UNIT/ML SOPN Inject 34 Units into the skin daily.     [provider]  insulin lispro (ADMELOG) 100 UNIT/ML injection Inject 1-9 Units into the skin See admin instructions. Inject as per sliding scale : If 121-150 = 1 unit, 151-200=2 units, 201-250=3 units, 251-300=5 units, 301-350=7 units, 351-400=9 units before meals and at bedtime.    [provider]  levothyroxine (SYNTHROID, LEVOTHROID) 200 MCG tablet Take 200 mcg by mouth daily before breakfast.  06/24/18   [provider]  Multiple Vitamin (MULTIVITAMIN WITH MINERALS) TABS tablet Take 1 tablet by mouth daily. 05/25/19   Maryanna Shape, NP  nitroGLYCERIN (NITROSTAT) 0.4 MG SL tablet Place 0.4 mg under the tongue every 5 (five) minutes as needed for chest pain.  03/26/18   [provider]  omeprazole (PRILOSEC) 20 MG capsule Take 20 mg by mouth daily.    [provider]  ondansetron (ZOFRAN) 8 MG tablet Take 1 tablet (8 mg total) by mouth every 8 (eight) hours as needed for nausea or vomiting. 04/28/19   Denton Brick, Courage, MD  oxyCODONE (OXY IR/ROXICODONE) 5 MG immediate release tablet Take 1-2 tablets (5-10 mg total) by mouth every 6 (six) hours as needed for severe pain. 06/11/19   Manuella Ghazi, Pratik D, DO  pantoprazole (PROTONIX) 40 MG tablet Take 1 tablet (40 mg total) by mouth 2 (two) times daily. Patient not taking: Reported on 06/11/2019 10/07/18   Truitt Merle, MD  polyethylene glycol (MIRALAX / GLYCOLAX) 17 g packet Take 17 g by mouth 2 (two) times daily. 04/14/19   Shelly Coss, MD  potassium chloride SA (KLOR-CON) 20 MEQ tablet Take 1 tablet (20 mEq total) by mouth daily. 04/28/19   Roxan Hockey, MD  promethazine (PHENERGAN) 12.5 MG tablet Take 1 tablet (12.5 mg total) by mouth every 6 (six) hours as needed  for nausea or vomiting. 02/22/19   Alla Feeling, NP  senna (SENOKOT) 8.6 MG TABS tablet Take 2 tablets (17.2 mg total) by mouth 2 (two) times daily. Patient taking differently: Take 2 tablets by mouth 2 (two) times daily as needed for moderate constipation.  04/28/19   Roxan Hockey, MD  simethicone (MYLICON) 80 MG chewable tablet Chew 1 tablet (80 mg total) by mouth 3 (three) times daily as needed for flatulence. 05/24/19   Maryanna Shape, NP  sucralfate (CARAFATE) 1 g tablet Take 1 tablet (1 g total) by mouth 4 (four) times daily -  with meals and at bedtime. 5 min before meals for radiation induced esophagitis Patient taking differently: Take 1 g by mouth 4 (four) times daily. 5 min before meals for radiation induced esophagitis 12/03/18   Tyler Pita, MD  sulfamethoxazole-trimethoprim (BACTRIM DS) 800-160 MG tablet Take 1 tablet by mouth 2 (two) times daily. 06/03/19   [provider]  tiZANidine (ZANAFLEX) 4 MG tablet Take 1 tablet (4 mg total) by mouth every 8 (eight) hours as needed for muscle spasms. 06/11/19   Manuella Ghazi, Pratik D, DO  topiramate (TOPAMAX) 25 MG tablet Take 75 mg by mouth at bedtime.  03/18/18   [provider]  triamcinolone cream (KENALOG) 0.1 %  05/19/19   [provider]  trolamine salicylate (ASPERCREME/ALOE) 10 % cream Apply 1 application topically as needed for muscle pain. 06/11/19   Manuella Ghazi, Pratik D, DO  umeclidinium-vilanterol (ANORO ELLIPTA) 62.5-25 MCG/INH AEPB Inhale 1 puff into the lungs daily. 05/26/17   [name], MD    Allergies    Dilaudid [hydromorphone hcl]; Midodrine hcl; Actifed cold-allergy [chlorpheniramine-phenylephrine]; Ambrosia artemisiifolia (ragweed) skin test; Antihistamines, chlorpheniramine-type; Doxycycline; Other; Penicillins; Phenylephrine; Reglan [metoclopramide]; Valium [diazepam]; and  Vistaril [hydroxyzine hcl]  Review of Systems   Review of Systems  All other systems reviewed and are negative.   Physical  Exam Updated Vital Signs BP (!) 101/53 (BP Location: Left Arm)   Temp 98.8 F (37.1 C) (Oral)   Resp 18   SpO2 100%   Physical Exam Vitals and nursing note reviewed.  Constitutional:      General: She is not in acute distress.    Appearance: She is well-developed.  HENT:     Head: Normocephalic and atraumatic.     Right Ear: External ear normal.     Left Ear: External ear normal.  Eyes:     General: No scleral icterus.       Right eye: No discharge.        Left eye: No discharge.     Conjunctiva/sclera: Conjunctivae normal.  Neck:     Trachea: No tracheal deviation.  Cardiovascular:     Rate and Rhythm: Normal rate and regular rhythm.  Pulmonary:     Effort: Pulmonary effort is normal. No respiratory distress.     Breath sounds: Normal breath sounds. No stridor. No wheezing or rales.  Abdominal:     General: Bowel sounds are normal. There is no distension.     Palpations: Abdomen is soft.     Tenderness: There is no abdominal tenderness. There is no guarding or rebound.  Genitourinary:    Comments: Brown stool with red change, no large clots of blood, no melena Musculoskeletal:        General: No tenderness.     Cervical back: Neck supple.     Right lower leg: Edema present.     Left lower leg: Edema present.  Skin:    General: Skin is warm and dry.     Coloration: Skin is pale.     Findings: No rash.  Neurological:     Mental Status: She is alert.     Cranial Nerves: No cranial nerve deficit (no facial droop, extraocular movements intact, no slurred speech).     Sensory: No sensory deficit.     Motor: Weakness present. No abnormal muscle tone or seizure activity.     Coordination: Coordination normal.     Comments: Generalized weakness, patient is able to move all extremities however, no facial droop     ED Results / Procedures / Treatments   Labs (all labs ordered are listed, but only abnormal results are displayed) Labs Reviewed  COMPREHENSIVE METABOLIC  PANEL - Abnormal; Notable for the following components:      Result Value   Glucose, Bld 170 (*)    BUN 28 (*)    Creatinine, Ser 1.31 (*)    Total Protein 5.3 (*)    Albumin 2.8 (*)    GFR calc non Af Amer 41 (*)    GFR calc Af Amer 48 (*)    All other components within normal limits  CBC WITH DIFFERENTIAL/PLATELET - Abnormal; Notable for the following components:   RBC 2.55 (*)    Hemoglobin 7.6 (*)    HCT 25.2 (*)    RDW 16.4 (*)    Lymphs Abs 0.6 (*)    All other components within normal limits  APTT  PROTIME-INR  TYPE AND SCREEN    EKG None  Radiology No results found.  Procedures Procedures (including critical care time)  Medications Ordered in ED Medications  sodium chloride 0.9 % bolus 500 mL (500 mLs Intravenous New Bag/Given 06/14/19 1733)  And  0.9 %  sodium chloride infusion (has no administration in time range)    ED Course  I have reviewed the triage vital signs and the nursing notes.  Pertinent labs & imaging results that were available during my care of the patient were reviewed by me and considered in my medical decision making (see chart for details).  Clinical Course as of Jun 13 1740  Tue Jun 14, 2019  1657 Labs reviewed hemoglobin stable at 7.6, similar to discharge value of 7.83 days ago   [JK]  7408 Metabolic panel without significant changes.   [JK]    Clinical Course User Index [JK] Dorie Rank, MD   MDM Rules/Calculators/A&P                      Patient has known unresectable colon cancer.  She is also not a candidate for chemotherapy.  Hospice and palliative care have been recommended.  Unfortunately patient does continue to have rectal bleeding associated with her known cancer and she is not a candidate for active interventions.  Patient will likely continue to have rectal bleeding.  I do not think there is an indication for abdomen admission to the hospital at this time as she does not require a blood transfusion.  Her hemoglobin is  stable compared to her last hemoglobin.  Plan on discharge back to the nursing facility.  Discussed monitoring and close outpatient follow-up. Final Clinical Impression(s) / ED Diagnoses Final diagnoses:  Rectal bleeding    Rx / DC Orders ED Discharge Orders    None       Dorie Rank, MD 06/14/19 1742

## 2019-06-14 NOTE — Discharge Instructions (Addendum)
Continue to monitor for persistent bleeding.  You will need to have your blood test rechecked in a few days.  Return to the ED for worsening symptoms.  Follow up with Dr  Burr Medico

## 2019-06-14 NOTE — ED Triage Notes (Signed)
Pt in via [facility] for eval for blood in stools, per report staff was unable to collect labs, pt A&O x4

## 2019-06-14 NOTE — ED Notes (Signed)
[facility] notified for transport back to [facility]

## 2019-06-15 DIAGNOSIS — M6281 Muscle weakness (generalized): Secondary | ICD-10-CM | POA: Diagnosis not present

## 2019-06-15 DIAGNOSIS — L89896 Pressure-induced deep tissue damage of other site: Secondary | ICD-10-CM | POA: Diagnosis not present

## 2019-06-15 DIAGNOSIS — L89313 Pressure ulcer of right buttock, stage 3: Secondary | ICD-10-CM | POA: Diagnosis not present

## 2019-06-15 DIAGNOSIS — E10622 Type 1 diabetes mellitus with other skin ulcer: Secondary | ICD-10-CM | POA: Diagnosis not present

## 2019-06-21 ENCOUNTER — Telehealth: Payer: Self-pay

## 2019-06-21 DIAGNOSIS — C3490 Malignant neoplasm of unspecified part of unspecified bronchus or lung: Secondary | ICD-10-CM | POA: Diagnosis not present

## 2019-06-21 DIAGNOSIS — C7931 Secondary malignant neoplasm of brain: Secondary | ICD-10-CM | POA: Diagnosis not present

## 2019-06-21 DIAGNOSIS — C19 Malignant neoplasm of rectosigmoid junction: Secondary | ICD-10-CM | POA: Diagnosis not present

## 2019-06-21 NOTE — Telephone Encounter (Signed)
SW attempted to contact daughter, [name] to discuss palliative care support while patient is at [facility]. SW left VM with contact information.

## 2019-06-22 DIAGNOSIS — L89322 Pressure ulcer of left buttock, stage 2: Secondary | ICD-10-CM | POA: Diagnosis not present

## 2019-06-22 DIAGNOSIS — L89329 Pressure ulcer of left buttock, unspecified stage: Secondary | ICD-10-CM | POA: Diagnosis not present

## 2019-06-22 DIAGNOSIS — L89313 Pressure ulcer of right buttock, stage 3: Secondary | ICD-10-CM | POA: Diagnosis not present

## 2019-06-22 DIAGNOSIS — L89896 Pressure-induced deep tissue damage of other site: Secondary | ICD-10-CM | POA: Diagnosis not present

## 2019-06-29 DIAGNOSIS — L89896 Pressure-induced deep tissue damage of other site: Secondary | ICD-10-CM | POA: Diagnosis not present

## 2019-06-29 DIAGNOSIS — L89329 Pressure ulcer of left buttock, unspecified stage: Secondary | ICD-10-CM | POA: Diagnosis not present

## 2019-06-29 DIAGNOSIS — L89322 Pressure ulcer of left buttock, stage 2: Secondary | ICD-10-CM | POA: Diagnosis not present

## 2019-06-29 DIAGNOSIS — L89313 Pressure ulcer of right buttock, stage 3: Secondary | ICD-10-CM | POA: Diagnosis not present

## 2019-07-05 ENCOUNTER — Other Ambulatory Visit: Payer: Self-pay

## 2019-07-05 ENCOUNTER — Other Ambulatory Visit: Payer: Medicare Other

## 2019-07-05 ENCOUNTER — Non-Acute Institutional Stay: Payer: Medicare Other

## 2019-07-05 DIAGNOSIS — Z515 Encounter for palliative care: Secondary | ICD-10-CM

## 2019-07-05 NOTE — Progress Notes (Signed)
PATIENT NAME: Stephanie Combs DOB: [birth year] MRN: [id number]  PRIMARY CARE PROVIDER: [name], MD  RESPONSIBLE PARTY:  Acct ID - Guarantor Home Phone Work Phone Relationship Acct Type  [id number] Hezzie [phone]  Self P/F     [address], [place], [place] [address]    PLAN OF CARE and INTERVENTIONS:               1.  GOALS OF CARE/ ADVANCE CARE PLANNING:  Remain at facility and be comfortable.               2.  PATIENT/CAREGIVER EDUCATION:  Education on disease progression, education on hospice verses palliative care, support               4. PERSONAL EMERGENCY PLAN:  Patient is a resident at [facility] and skilled facility.                 5.  DISEASE STATUS: SW and RN made joint visit to facility. Patient lying in bed crunching ice and drinking ice water. Patient alert to self. Patient does not know what month it is or where she is. 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  Patient also has colon cancer. Patient has had two hospitalizations in the last month one for rectal bleeding and once for anemia. Patient receiving O2 via nasal cannula at 3 liters per minute. Patients O2 SATs on 3 liters of oxygen currently 99%. Palliative care team spoke with [facility] discharge planner at facility. Patient is appropriate for hospice care. Clarene Critchley states patient will likely be switching to LTC in  the next short while. Patients heart rate slightly elevated at visit today currently 103. Patient is bed bound however discharge planner reports patient may be transferred to wheelchair 1-2 times per week and sit in her wheelchair 30 minutes.  Patient is total care and dependent for all ADL's other than she can feed herself. Patients daughter [name] was in agreement with palliative care resuming services at facility. Patient tearful at times during visit. Patient states she wants to go home however this is not an option as patient has no caregiver. Patient has developed sacral skin breakdown and  is receiving wound care at facility. Social worker updated Enid Skeens on patients status and that patient is hospice appropriate. Palliative care team left contact information with discharge planner Clarene Critchley. Facility staff encouraged to contact palliative care with questions or concerns.    HISTORY OF PRESENT ILLNESS: Patient is a 69 year old patient who is followed by palliative care.  Patient will be seen monthly ad PRN by palliative care team.   CODE STATUS: DNR  ADVANCED DIRECTIVES:Y MOST FORM: No PPS: 30%   PHYSICAL EXAM:   VITALS: Today's Vitals   07/05/19 1229  BP: (!) 118/54  Pulse: (!) 103  Resp: 18  Temp: (!) 97.4 F (36.3 C)  TempSrc: Temporal  SpO2: 99%  PainSc: 4     LUNGS: decreased breath sounds CARDIAC: Cor RRR  EXTREMITIES: Trace edema SKIN: stage II skin breakdown on sacral area  NEURO: positive for gait problems, memory problems and weakness       Nilda Simmer, RN

## 2019-07-05 NOTE — Progress Notes (Signed)
COMMUNITY PALLIATIVE CARE SW NOTE  PATIENT NAME: Stephanie Combs DOB: [birth year] MRN: [id number]  PRIMARY CARE PROVIDER: [name], MD  RESPONSIBLE PARTY:  Acct ID - Guarantor Home Phone Work Phone Relationship Acct Type  [id number] Hezzie [phone]  Self P/F     [address], [place], [place] [address]     PLAN OF CARE and INTERVENTIONS:             1. GOALS OF CARE/ ADVANCE CARE PLANNING:  Patient is DNR, form is at the facility. HCPOA is Building services engineer. Goal is to remain at facility. 2. SOCIAL/EMOTIONAL/SPIRITUAL ASSESSMENT/ INTERVENTIONS:  SW and RN met with patient at [facility]. Patient had one hospitalization at the end of January and one ED visit in early February. Patient reports pain in her lower back. Patient does have a wound on her lower back that staff said started at the hospital and they are addressing. Patient was laying in bed, chewing on ice. Patient is sleeping more. Patient is getting up to the wheelchair occasionally for very short periods. Patient is on 3L of 02. Patient was hard to direct in conversation and appeared tired. Patient was appreciative of visit. Team discussed care with Clarene Critchley, SW at facility. Clarene Critchley is helping with care plan and said patient will remain at facility in LTC following therapy services completion. SW left Gray a VM with contact information. SW provided emotional support, discussed care plan and used active and reflective listening.  3. PATIENT/CAREGIVER EDUCATION/ COPING:  Patient was talkative and engaged. Patient was oriented to self only. Patient was unable to tell team where she was or what month it was. Patient was tearful at times during visit. Patient said "I'm nervous all the time" but could not identify what she was nervous about. Team provided encouragement and validation. [name] is supportive is able to come for window visits at this time. 4. PERSONAL EMERGENCY PLAN:  Facility will follow protocol. 5. COMMUNITY RESOURCES  COORDINATION/ HEALTH CARE NAVIGATION:  Patient is receiving PT/OT at this time. Facility helps coordinate care with [employer] 6. FINANCIAL/LEGAL CONCERNS/INTERVENTIONS:  Patient has active Medicaid coverage.      SOCIAL HX:  Social History   Tobacco Use  . Smoking status: Former Smoker    Packs/day: 1.50    Years: 45.00    Pack years: 67.50    Types: Cigarettes    Quit date: 11/24/2014    Years since quitting: 4.6  . Smokeless tobacco: Never Used  Substance Use Topics  . Alcohol use: No    Alcohol/week: 0.0 standard drinks    CODE STATUS:   Code Status: Prior (DNR) ADVANCED DIRECTIVES: Y MOST FORM COMPLETE:  No. HOSPICE EDUCATION PROVIDED: Patient is hospice appropriate. Updated admin team of plan for patient to transition to LTC with hospice services once completed with therapy. [facility] fundraiser information left with [place], SW at facility.    PPS: Patient is mostly dependent of ADLs. Patient is able to feed herself.  I spent 45 minutes with patient/family, from 11:00-11:45a providing education, support and consultation.   Margaretmary Lombard, LCSW

## 2019-07-06 ENCOUNTER — Emergency Department (HOSPITAL_COMMUNITY): Payer: Medicare Other

## 2019-07-06 ENCOUNTER — Inpatient Hospital Stay (HOSPITAL_COMMUNITY)
Admission: EM | Admit: 2019-07-06 | Discharge: 2019-07-08 | DRG: 374 | Disposition: A | Discharge status: Hospice | Payer: Medicare Other | Source: Skilled Nursing Facility | Attending: Family Medicine | Admitting: Family Medicine

## 2019-07-06 ENCOUNTER — Other Ambulatory Visit: Payer: Self-pay

## 2019-07-06 ENCOUNTER — Encounter (HOSPITAL_COMMUNITY): Payer: Self-pay | Admitting: Emergency Medicine

## 2019-07-06 DIAGNOSIS — Z880 Allergy status to penicillin: Secondary | ICD-10-CM | POA: Diagnosis not present

## 2019-07-06 DIAGNOSIS — E039 Hypothyroidism, unspecified: Secondary | ICD-10-CM | POA: Diagnosis present

## 2019-07-06 DIAGNOSIS — J449 Chronic obstructive pulmonary disease, unspecified: Secondary | ICD-10-CM | POA: Diagnosis present

## 2019-07-06 DIAGNOSIS — E876 Hypokalemia: Secondary | ICD-10-CM | POA: Diagnosis not present

## 2019-07-06 DIAGNOSIS — E11649 Type 2 diabetes mellitus with hypoglycemia without coma: Secondary | ICD-10-CM | POA: Diagnosis present

## 2019-07-06 DIAGNOSIS — I959 Hypotension, unspecified: Secondary | ICD-10-CM | POA: Diagnosis not present

## 2019-07-06 DIAGNOSIS — J189 Pneumonia, unspecified organism: Secondary | ICD-10-CM | POA: Diagnosis not present

## 2019-07-06 DIAGNOSIS — Z66 Do not resuscitate: Secondary | ICD-10-CM | POA: Diagnosis present

## 2019-07-06 DIAGNOSIS — Z515 Encounter for palliative care: Secondary | ICD-10-CM

## 2019-07-06 DIAGNOSIS — K921 Melena: Secondary | ICD-10-CM | POA: Diagnosis not present

## 2019-07-06 DIAGNOSIS — Z7401 Bed confinement status: Secondary | ICD-10-CM | POA: Diagnosis not present

## 2019-07-06 DIAGNOSIS — Z20822 Contact with and (suspected) exposure to covid-19: Secondary | ICD-10-CM | POA: Diagnosis present

## 2019-07-06 DIAGNOSIS — M549 Dorsalgia, unspecified: Secondary | ICD-10-CM | POA: Diagnosis present

## 2019-07-06 DIAGNOSIS — D509 Iron deficiency anemia, unspecified: Secondary | ICD-10-CM | POA: Diagnosis not present

## 2019-07-06 DIAGNOSIS — G8929 Other chronic pain: Secondary | ICD-10-CM | POA: Diagnosis present

## 2019-07-06 DIAGNOSIS — Z794 Long term (current) use of insulin: Secondary | ICD-10-CM

## 2019-07-06 DIAGNOSIS — F418 Other specified anxiety disorders: Secondary | ICD-10-CM | POA: Diagnosis present

## 2019-07-06 DIAGNOSIS — C3412 Malignant neoplasm of upper lobe, left bronchus or lung: Secondary | ICD-10-CM | POA: Diagnosis present

## 2019-07-06 DIAGNOSIS — I2510 Atherosclerotic heart disease of native coronary artery without angina pectoris: Secondary | ICD-10-CM | POA: Diagnosis not present

## 2019-07-06 DIAGNOSIS — N183 Chronic kidney disease, stage 3 unspecified: Secondary | ICD-10-CM | POA: Diagnosis present

## 2019-07-06 DIAGNOSIS — C183 Malignant neoplasm of hepatic flexure: Secondary | ICD-10-CM | POA: Diagnosis present

## 2019-07-06 DIAGNOSIS — J440 Chronic obstructive pulmonary disease with acute lower respiratory infection: Secondary | ICD-10-CM | POA: Diagnosis not present

## 2019-07-06 DIAGNOSIS — E10622 Type 1 diabetes mellitus with other skin ulcer: Secondary | ICD-10-CM | POA: Diagnosis not present

## 2019-07-06 DIAGNOSIS — E875 Hyperkalemia: Secondary | ICD-10-CM | POA: Diagnosis present

## 2019-07-06 DIAGNOSIS — R319 Hematuria, unspecified: Secondary | ICD-10-CM | POA: Diagnosis not present

## 2019-07-06 DIAGNOSIS — E119 Type 2 diabetes mellitus without complications: Secondary | ICD-10-CM

## 2019-07-06 DIAGNOSIS — Z833 Family history of diabetes mellitus: Secondary | ICD-10-CM

## 2019-07-06 DIAGNOSIS — C19 Malignant neoplasm of rectosigmoid junction: Secondary | ICD-10-CM | POA: Diagnosis not present

## 2019-07-06 DIAGNOSIS — G9341 Metabolic encephalopathy: Secondary | ICD-10-CM | POA: Diagnosis not present

## 2019-07-06 DIAGNOSIS — K922 Gastrointestinal hemorrhage, unspecified: Secondary | ICD-10-CM | POA: Diagnosis not present

## 2019-07-06 DIAGNOSIS — I5032 Chronic diastolic (congestive) heart failure: Secondary | ICD-10-CM | POA: Diagnosis not present

## 2019-07-06 DIAGNOSIS — E860 Dehydration: Secondary | ICD-10-CM | POA: Diagnosis present

## 2019-07-06 DIAGNOSIS — D630 Anemia in neoplastic disease: Secondary | ICD-10-CM | POA: Diagnosis present

## 2019-07-06 DIAGNOSIS — Z79890 Hormone replacement therapy (postmenopausal): Secondary | ICD-10-CM

## 2019-07-06 DIAGNOSIS — Z87891 Personal history of nicotine dependence: Secondary | ICD-10-CM

## 2019-07-06 DIAGNOSIS — Z888 Allergy status to other drugs, medicaments and biological substances status: Secondary | ICD-10-CM

## 2019-07-06 DIAGNOSIS — N390 Urinary tract infection, site not specified: Secondary | ICD-10-CM | POA: Diagnosis not present

## 2019-07-06 DIAGNOSIS — J9611 Chronic respiratory failure with hypoxia: Secondary | ICD-10-CM | POA: Diagnosis present

## 2019-07-06 DIAGNOSIS — R7989 Other specified abnormal findings of blood chemistry: Secondary | ICD-10-CM | POA: Diagnosis not present

## 2019-07-06 DIAGNOSIS — L89313 Pressure ulcer of right buttock, stage 3: Secondary | ICD-10-CM | POA: Diagnosis not present

## 2019-07-06 DIAGNOSIS — J984 Other disorders of lung: Secondary | ICD-10-CM | POA: Diagnosis not present

## 2019-07-06 DIAGNOSIS — Z7189 Other specified counseling: Secondary | ICD-10-CM | POA: Diagnosis not present

## 2019-07-06 DIAGNOSIS — C7931 Secondary malignant neoplasm of brain: Secondary | ICD-10-CM | POA: Diagnosis present

## 2019-07-06 DIAGNOSIS — Z8249 Family history of ischemic heart disease and other diseases of the circulatory system: Secondary | ICD-10-CM

## 2019-07-06 DIAGNOSIS — M069 Rheumatoid arthritis, unspecified: Secondary | ICD-10-CM | POA: Diagnosis not present

## 2019-07-06 DIAGNOSIS — R410 Disorientation, unspecified: Secondary | ICD-10-CM | POA: Diagnosis not present

## 2019-07-06 DIAGNOSIS — Z9981 Dependence on supplemental oxygen: Secondary | ICD-10-CM

## 2019-07-06 DIAGNOSIS — Z79899 Other long term (current) drug therapy: Secondary | ICD-10-CM

## 2019-07-06 DIAGNOSIS — Z7982 Long term (current) use of aspirin: Secondary | ICD-10-CM

## 2019-07-06 DIAGNOSIS — I130 Hypertensive heart and chronic kidney disease with heart failure and stage 1 through stage 4 chronic kidney disease, or unspecified chronic kidney disease: Secondary | ICD-10-CM | POA: Diagnosis present

## 2019-07-06 DIAGNOSIS — M6281 Muscle weakness (generalized): Secondary | ICD-10-CM | POA: Diagnosis not present

## 2019-07-06 DIAGNOSIS — Z881 Allergy status to other antibiotic agents status: Secondary | ICD-10-CM | POA: Diagnosis not present

## 2019-07-06 DIAGNOSIS — D649 Anemia, unspecified: Secondary | ICD-10-CM | POA: Diagnosis not present

## 2019-07-06 DIAGNOSIS — D500 Iron deficiency anemia secondary to blood loss (chronic): Secondary | ICD-10-CM | POA: Diagnosis present

## 2019-07-06 DIAGNOSIS — R7889 Finding of other specified substances, not normally found in blood: Secondary | ICD-10-CM | POA: Diagnosis not present

## 2019-07-06 DIAGNOSIS — E1122 Type 2 diabetes mellitus with diabetic chronic kidney disease: Secondary | ICD-10-CM | POA: Diagnosis present

## 2019-07-06 DIAGNOSIS — Z955 Presence of coronary angioplasty implant and graft: Secondary | ICD-10-CM

## 2019-07-06 DIAGNOSIS — I4891 Unspecified atrial fibrillation: Secondary | ICD-10-CM | POA: Diagnosis not present

## 2019-07-06 DIAGNOSIS — I10 Essential (primary) hypertension: Secondary | ICD-10-CM | POA: Diagnosis present

## 2019-07-06 DIAGNOSIS — Z885 Allergy status to narcotic agent status: Secondary | ICD-10-CM

## 2019-07-06 DIAGNOSIS — Z823 Family history of stroke: Secondary | ICD-10-CM

## 2019-07-06 DIAGNOSIS — L89896 Pressure-induced deep tissue damage of other site: Secondary | ICD-10-CM | POA: Diagnosis not present

## 2019-07-06 DIAGNOSIS — F419 Anxiety disorder, unspecified: Secondary | ICD-10-CM | POA: Diagnosis present

## 2019-07-06 LAB — COMPREHENSIVE METABOLIC PANEL
ALT: 14 U/L (ref 0–44)
AST: 18 U/L (ref 15–41)
Albumin: 3.1 g/dL — ABNORMAL LOW (ref 3.5–5.0)
Alkaline Phosphatase: 95 U/L (ref 38–126)
Anion gap: 15 (ref 5–15)
BUN: 89 mg/dL — ABNORMAL HIGH (ref 8–23)
CO2: 23 mmol/L (ref 22–32)
Calcium: 9.4 mg/dL (ref 8.9–10.3)
Chloride: 107 mmol/L (ref 98–111)
Creatinine, Ser: 1.59 mg/dL — ABNORMAL HIGH (ref 0.44–1.00)
GFR calc Af Amer: 38 mL/min — ABNORMAL LOW (ref >60)
GFR calc non Af Amer: 33 mL/min — ABNORMAL LOW (ref >60)
Glucose, Bld: 241 mg/dL — ABNORMAL HIGH (ref 70–99)
Potassium: 2.5 mmol/L — CL (ref 3.5–5.1)
Sodium: 145 mmol/L (ref 135–145)
Total Bilirubin: 0.5 mg/dL (ref 0.3–1.2)
Total Protein: 6.6 g/dL (ref 6.5–8.1)

## 2019-07-06 LAB — CBC WITH DIFFERENTIAL/PLATELET
Abs Immature Granulocytes: 0.19 10*3/uL — ABNORMAL HIGH (ref 0.00–0.07)
Basophils Absolute: 0 10*3/uL (ref 0.0–0.1)
Basophils Relative: 0 %
Eosinophils Absolute: 0 10*3/uL (ref 0.0–0.5)
Eosinophils Relative: 0 %
HCT: 19.4 % — ABNORMAL LOW (ref 36.0–46.0)
Hemoglobin: 5.5 g/dL — CL (ref 12.0–15.0)
Immature Granulocytes: 1 %
Lymphocytes Relative: 6 %
Lymphs Abs: 0.7 10*3/uL (ref 0.7–4.0)
MCH: 27.5 pg (ref 26.0–34.0)
MCHC: 28.4 g/dL — ABNORMAL LOW (ref 30.0–36.0)
MCV: 97 fL (ref 80.0–100.0)
Monocytes Absolute: 0.6 10*3/uL (ref 0.1–1.0)
Monocytes Relative: 5 %
Neutro Abs: 12 10*3/uL — ABNORMAL HIGH (ref 1.7–7.7)
Neutrophils Relative %: 88 %
Platelets: 465 10*3/uL — ABNORMAL HIGH (ref 150–400)
RBC: 2 MIL/uL — ABNORMAL LOW (ref 3.87–5.11)
RDW: 16.9 % — ABNORMAL HIGH (ref 11.5–15.5)
WBC: 13.6 10*3/uL — ABNORMAL HIGH (ref 4.0–10.5)
nRBC: 0.8 % — ABNORMAL HIGH (ref 0.0–0.2)

## 2019-07-06 LAB — MAGNESIUM: Magnesium: 2.2 mg/dL (ref 1.7–2.4)

## 2019-07-06 LAB — BRAIN NATRIURETIC PEPTIDE: B Natriuretic Peptide: 230 pg/mL — ABNORMAL HIGH (ref 0.0–100.0)

## 2019-07-06 LAB — PREPARE RBC (CROSSMATCH)

## 2019-07-06 LAB — LIPASE, BLOOD: Lipase: 14 U/L (ref 11–51)

## 2019-07-06 MED ORDER — SODIUM CHLORIDE 0.9 % IV SOLN
2.0000 g | Freq: Two times a day (BID) | INTRAVENOUS | Status: DC
  Administered 2019-07-07 – 2019-07-08 (×3): 2 g via INTRAVENOUS
  Filled 2019-07-06 (×3): qty 2

## 2019-07-06 MED ORDER — SODIUM CHLORIDE 0.9 % IV SOLN: 10.0000 mL/h | Freq: Once | INTRAVENOUS | Status: DC

## 2019-07-06 MED ORDER — SODIUM CHLORIDE 0.9 % IV SOLN: INTRAVENOUS | Status: DC

## 2019-07-06 MED ORDER — POTASSIUM CHLORIDE CRYS ER 20 MEQ PO TBCR
40.0000 meq | EXTENDED_RELEASE_TABLET | Freq: Once | ORAL | Status: AC
  Administered 2019-07-06: 40 meq via ORAL
  Filled 2019-07-06: qty 2

## 2019-07-06 MED ORDER — POTASSIUM CHLORIDE 10 MEQ/100ML IV SOLN
10.0000 meq | Freq: Once | INTRAVENOUS | Status: AC
  Administered 2019-07-06: 10 meq via INTRAVENOUS
  Filled 2019-07-06: qty 100

## 2019-07-06 NOTE — H&P (Addendum)
History and Physical    Stephanie Combs JKD:[id number] DOB: 03-26-1951 DOA: 07/06/2019  PCP: [name], MD   Patient coming from: [facility]  Chief Complaint: Low Hemoglobin  HPI: Stephanie Combs is a 69 y.o. female with medical history significant for unresectable colon cancer, metastatic lung cancer, COPD with chronic respiratory failure, diastolic CHF.  Patient was brought to the ED from nursing home with reports of low hemoglobin of 5.5.  On my evaluation patient awake alert and confused.  She keeps saying she wants to stay in the hospital and she wants to go home.  She tells me she is confused all the time.  She says she has increasing difficulty breathing but this is unchanged, she reports always coughing, and always having dysuria.  Multiple recent hospitalizations-2 admissions in January for acute anemia.  Last hospitalization 1/28- 1/30, with hemoglobin of 4.1, transfused 3 units PRBC.  Discharge hemoglobin was 7.8.  ED Course: Tachycardic to 108, temperature 99.  Blood pressure 151/52.  O2 sats 95% on 3 L.  Hemoglobin 5.5.  Potassium 2.5.  Required 3 PRBCs ordered for transfusion.  Port chest xray- pneumonia or mass lesion/lesions.  Black tarry stool on rectal exam by EDP.  Hospitalist to admit for evaluation and management.  Review of Systems: As per HPI all other systems reviewed and negative.  Past Medical History:  Diagnosis Date  . Anxiety   . CAD (coronary artery disease)    a. s/p DES to LAD and angioplasty to D1 in 05/2017  . Cancer ([place])   . CHF (congestive heart failure) (HCC)    Diastolic  . Chronic back pain   . Chronic pain   . COPD (chronic obstructive pulmonary disease) ([place])   . Degenerative disk disease   . Diabetes mellitus without complication ([place])   . History of medication noncompliance 11/2017   "the patient frequently self-adjusts medications without notifying her physicians"  . Hypertension   . Hypothyroidism   . On home O2    2.5 L N/C prn    . Pedal edema     Past Surgical History:  Procedure Laterality Date  . BIOPSY  09/09/2018   Procedure: BIOPSY;  Surgeon: Daneil Dolin, MD;  [facility];  Service: Endoscopy;;  gastric esophageal hepatic flexure colon  . COLONOSCOPY WITH PROPOFOL N/A 09/09/2018   hepatic flexure colon mass s/p biopsy, with polyps associated with tumor scattered all the way to the cecum not removed, multiple splenic flexure and rectal polyps removed, left-sided diverticulosis, ?right hemicolectomy. Hepatic flexure adenocarcinoma and additional adenocarcinoma in small splenic flexure polyp. Concern for synchronous colon cancer. High risk for surgical resection.   . CORONARY STENT INTERVENTION N/A 05/22/2017   Procedure: CORONARY STENT INTERVENTION;  Surgeon: Martinique, Peter M, MD;  Location: [place] [place] [facility];  Service: Cardiovascular;  Laterality: N/A;  . ESOPHAGOGASTRODUODENOSCOPY (EGD) WITH PROPOFOL N/A 09/09/2018   abnormal distal esophagus suspicious for Barrett's s/p biopsy, erosive gastropathy, duodenal bulbar erosions. +Barrett's  . HERNIA REPAIR    . POLYPECTOMY  09/09/2018   Procedure: POLYPECTOMY;  Surgeon: Daneil Dolin, MD;  [facility];  Service: Endoscopy;;  colon  . RIGHT/LEFT HEART CATH AND CORONARY ANGIOGRAPHY N/A 05/19/2017   Procedure: RIGHT/LEFT HEART CATH AND CORONARY ANGIOGRAPHY;  Surgeon: Leonie Man, MD;  Location: [place] [facility];  Service: Cardiovascular;  Laterality: N/A;  . TUBAL LIGATION    . VIDEO BRONCHOSCOPY WITH ENDOBRONCHIAL ULTRASOUND Left 09/14/2018   Procedure: VIDEO BRONCHOSCOPY  WITH ENDOBRONCHIAL ULTRASOUND with biopsies;  Surgeon: Margaretha Seeds, MD;  Location: [facility];  Service: Thoracic;  Laterality: Left;     reports that she quit smoking about 4 years ago. Her smoking use included cigarettes. She has a 67.50 pack-year smoking history. She has never used smokeless tobacco. She reports that she does not drink alcohol or use  drugs.  Allergies  Allergen Reactions  . Dilaudid [Hydromorphone Hcl] Itching  . Midodrine Hcl Swelling    After one dose had anaphylactic reaction, had to call EMS.  Marland Kitchen Actifed Cold-Allergy [Chlorpheniramine-Phenylephrine]     "I was sick and red and it didn't agree with me at all"  . Ambrosia Artemisiifolia (Ragweed) Skin Test   . Antihistamines, Chlorpheniramine-Type   . Doxycycline Nausea And Vomiting  . Other Cough    Pt states she is allergic to ragweed and that she starts coughing and sneezing like crazy  . Penicillins Hives and Itching    Tolerates Rocephin Has patient had a PCN reaction causing immediate rash, facial/tongue/throat swelling, SOB or lightheadedness with hypotension: Yes Has patient had a PCN reaction causing severe rash involving mucus membranes or skin necrosis: No Has patient had a PCN reaction that required hospitalization Yes Has patient had a PCN reaction occurring within the last 10 years: No If all of the above answers are "NO", then may proceed with Cephalosporin use.   Marland Kitchen Phenylephrine   . Reglan [Metoclopramide] Itching  . Valium [Diazepam] Itching  . Vistaril [Hydroxyzine Hcl] Itching    Family History  Problem Relation Age of Onset  . Stroke Mother        deceased 62   . Heart attack Father        deceased 33   . Diabetes Brother   . Cerebral palsy Brother   . Pneumonia Brother   . Diabetes Other   . Heart attack Other   . Colon cancer Neg Hx     Prior to Admission medications   Medication Sig Start Date End Date Taking? Authorizing Provider  albuterol (PROAIR HFA) 108 (90 Base) MCG/ACT inhaler Inhale 2 puffs into the lungs every 4 (four) hours as needed for wheezing or shortness of breath. For shortness of breath 09/22/18   Eugenie Filler, MD  albuterol (PROVENTIL) (2.5 MG/3ML) 0.083% nebulizer solution Take 2.5 mg by nebulization 2 (two) times daily as needed for wheezing or shortness of breath.     [provider]   allopurinol (ZYLOPRIM) 300 MG tablet Take 300 mg by mouth daily.    [provider]  ALPRAZolam Duanne Moron) 1 MG tablet Take 1 tablet (1 mg total) by mouth 3 (three) times daily as needed for anxiety or sleep. 06/11/19   Manuella Ghazi, Pratik D, DO  Amino Acids-Protein Hydrolys (FEEDING SUPPLEMENT, PRO-STAT SUGAR FREE 64,) LIQD Take 30 mLs by mouth daily. 05/24/19   Maryanna Shape, NP  aspirin EC 81 MG tablet Take 81 mg by mouth daily.    [provider]  atorvastatin (LIPITOR) 80 MG tablet Take 1 tablet (80 mg total) by mouth daily at 6 PM. 05/26/17   [name], MD  bacitracin 500 UNIT/GM ointment Apply 1 application topically 3 (three) times daily.    [provider]  busPIRone (BUSPAR) 7.5 MG tablet Take 1 tablet (7.5 mg total) by mouth 2 (two) times daily. 06/11/19   Manuella Ghazi, Pratik D, DO  cholecalciferol (VITAMIN D3) 25 MCG (1000 UT) tablet Take 5,000 Units by mouth daily.    [provider]  feeding supplement, ENSURE ENLIVE, (ENSURE ENLIVE) LIQD Take 237 mLs by mouth 2 (two) times daily between meals. 05/24/19   Maryanna Shape, NP  furosemide (LASIX) 40 MG tablet Take 1 tablet (40 mg total) by mouth daily. 04/28/19   Roxan Hockey, MD  gabapentin (NEURONTIN) 600 MG tablet Take 600 mg by mouth 3 (three) times daily.     [provider]  hydrocortisone (ANUSOL-HC) 2.5 % rectal cream Place 1 application rectally 4 (four) times daily as needed for hemorrhoids.  01/03/19   [provider]  insulin aspart (NOVOLOG) 100 UNIT/ML injection Inject 0-9 Units into the skin See admin instructions. insulin aspart (novoLOG) injection 0-9 Units 0-9 Units,  Subcutaneous, 3 times daily with meals, CBG < 70: Implement Hypoglycemia Standing Orders and refer to Hypoglycemia Standing Orders sidebar report  CBG 70 - 120: 0 units  CBG 121 - 150: 1 unit  CBG 151 - 200: 2 units  CBG 201 - 250: 3 units  CBG 251 - 300: 5 units  CBG 301 - 350: 7 units  CBG 351 - 400: 9  units CBG > 400: Patient not taking: Reported on 06/11/2019 04/28/19   Roxan Hockey, MD  insulin detemir (LEVEMIR) 100 UNIT/ML injection Inject 0.34 mLs (34 Units total) into the skin at bedtime. Patient not taking: Reported on 06/11/2019 05/24/19   Maryanna Shape, NP  Insulin Glargine (BASAGLAR KWIKPEN) 100 UNIT/ML SOPN Inject 34 Units into the skin daily.     [provider]  insulin lispro (ADMELOG) 100 UNIT/ML injection Inject 1-9 Units into the skin See admin instructions. Inject as per sliding scale : If 121-150 = 1 unit, 151-200=2 units, 201-250=3 units, 251-300=5 units, 301-350=7 units, 351-400=9 units before meals and at bedtime.    [provider]  levothyroxine (SYNTHROID, LEVOTHROID) 200 MCG tablet Take 200 mcg by mouth daily before breakfast.  06/24/18   [provider]  Multiple Vitamin (MULTIVITAMIN WITH MINERALS) TABS tablet Take 1 tablet by mouth daily. 05/25/19   Maryanna Shape, NP  nitroGLYCERIN (NITROSTAT) 0.4 MG SL tablet Place 0.4 mg under the tongue every 5 (five) minutes as needed for chest pain.  03/26/18   [provider]  omeprazole (PRILOSEC) 20 MG capsule Take 20 mg by mouth daily.    [provider]  ondansetron (ZOFRAN) 8 MG tablet Take 1 tablet (8 mg total) by mouth every 8 (eight) hours as needed for nausea or vomiting. 04/28/19   Denton Brick, Courage, MD  oxyCODONE (OXY IR/ROXICODONE) 5 MG immediate release tablet Take 1-2 tablets (5-10 mg total) by mouth every 6 (six) hours as needed for severe pain. 06/11/19   Manuella Ghazi, Pratik D, DO  pantoprazole (PROTONIX) 40 MG tablet Take 1 tablet (40 mg total) by mouth 2 (two) times daily. Patient not taking: Reported on 06/11/2019 10/07/18   Truitt Merle, MD  polyethylene glycol (MIRALAX / GLYCOLAX) 17 g packet Take 17 g by mouth 2 (two) times daily. 04/14/19   Shelly Coss, MD  potassium chloride SA (KLOR-CON) 20 MEQ tablet Take 1 tablet (20 mEq total) by mouth daily. 04/28/19   Roxan Hockey, MD  promethazine (PHENERGAN) 12.5 MG tablet Take 1 tablet (12.5 mg total) by mouth every 6 (six) hours as needed for nausea or vomiting. 02/22/19   Alla Feeling, NP  senna (SENOKOT) 8.6 MG TABS tablet Take 2 tablets (17.2 mg total) by mouth 2 (two) times daily. Patient taking differently: Take 2 tablets by mouth 2 (two)  times daily as needed for moderate constipation.  04/28/19   Roxan Hockey, MD  simethicone (MYLICON) 80 MG chewable tablet Chew 1 tablet (80 mg total) by mouth 3 (three) times daily as needed for flatulence. 05/24/19   Maryanna Shape, NP  sucralfate (CARAFATE) 1 g tablet Take 1 tablet (1 g total) by mouth 4 (four) times daily -  with meals and at bedtime. 5 min before meals for radiation induced esophagitis Patient taking differently: Take 1 g by mouth 4 (four) times daily. 5 min before meals for radiation induced esophagitis 12/03/18   Tyler Pita, MD  sulfamethoxazole-trimethoprim (BACTRIM DS) 800-160 MG tablet Take 1 tablet by mouth 2 (two) times daily. 06/03/19   [provider]  tiZANidine (ZANAFLEX) 4 MG tablet Take 1 tablet (4 mg total) by mouth every 8 (eight) hours as needed for muscle spasms. 06/11/19   Manuella Ghazi, Pratik D, DO  topiramate (TOPAMAX) 25 MG tablet Take 75 mg by mouth at bedtime.  03/18/18   [provider]  triamcinolone cream (KENALOG) 0.1 %  05/19/19   [provider]  trolamine salicylate (ASPERCREME/ALOE) 10 % cream Apply 1 application topically as needed for muscle pain. 06/11/19   Manuella Ghazi, Pratik D, DO  umeclidinium-vilanterol (ANORO ELLIPTA) 62.5-25 MCG/INH AEPB Inhale 1 puff into the lungs daily. 05/26/17   [name], MD    Physical Exam: Vitals:   07/06/19 1828 07/06/19 1831  BP: (!) 151/52   Pulse: (!) 108   Resp: 18   Temp: 99 F (37.2 C)   TempSrc: Oral   SpO2: 95%   Weight:  112 kg  Height:  5\' 5"  (1.651 m)    Constitutional: NAD, calm, comfortable Vitals:   07/06/19 1828 07/06/19 1831  BP: (!)  151/52   Pulse: (!) 108   Resp: 18   Temp: 99 F (37.2 C)   TempSrc: Oral   SpO2: 95%   Weight:  112 kg  Height:  5\' 5"  (1.651 m)   Eyes: PERRL, lids and conjunctivae normal ENMT: Mucous membranes are moist.  Neck: normal, supple, no masses, no thyromegaly Respiratory:Normal respiratory effort. No accessory muscle use.  Cardiovascular: Tachycardic, regular rate and rhythm, No extremity edema. 2+ pedal pulses. No carotid bruits.  Abdomen: obese, no tenderness, no masses palpated. No hepatosplenomegaly. Bowel sounds positive.  Musculoskeletal: no clubbing / cyanosis. No joint deformity upper and lower extremities. Good ROM, no contractures. Normal muscle tone.  Skin: no rashes, lesions, ulcers. No induration Neurologic: Appears to be bedbound, with pressure/boots to bilateral lower extremity.  Moving upper extremities.  Exam limited by patient's confusion Psychiatric: Normal judgment and insight. Alert and oriented to person and place, tells me she is here because she is confused. Normal mood.   Labs on Admission: I have personally reviewed following labs and imaging studies  CBC: Recent Labs  Lab 07/06/19 1901  WBC 13.6*  NEUTROABS 12.0*  HGB 5.5*  HCT 19.4*  MCV 97.0  PLT 474*   Basic Metabolic Panel: Recent Labs  Lab 07/06/19 1901  NA 145  K 2.5*  CL 107  CO2 23  GLUCOSE 241*  BUN 89*  CREATININE 1.59*  CALCIUM 9.4   Liver Function Tests: Recent Labs  Lab 07/06/19 1901  AST 18  ALT 14  ALKPHOS 95  BILITOT 0.5  PROT 6.6  ALBUMIN 3.1*   Recent Labs  Lab 07/06/19 1901  LIPASE 14    Radiological Exams on Admission: DG Chest Port 1 View  Result Date: 07/06/2019  CLINICAL DATA:  Anemia. EXAM: PORTABLE CHEST 1 VIEW COMPARISON:  06/10/2019 FINDINGS: Patient rotated towards the right. Mild cardiomegaly. Mild patchy pulmonary densities in the mid and lower lungs that are nonspecific. This could represent patchy pneumonia. Mass lesion not excluded. Follow-up  when able. IMPRESSION: Patchy densities in both mid and lower lungs. This could represent pneumonia or mass lesion/lesions. Follow-up when able. Electronically Signed   By: Nelson Chimes M.D.   On: 07/06/2019 20:33    EKG: Independently reviewed.  Read as atrial fibrillation.  But P waves mostly present, though not always discernible, rhythm is regular.  Assessment/Plan Principal Problem:   Acute on chronic anemia Active Problems:   COPD (chronic obstructive pulmonary disease) (HCC)   DM type 2 (diabetes mellitus, type 2) (HCC)   HTN (hypertension), benign   CKD (chronic kidney disease), stage III (HCC)   Chronic diastolic CHF (congestive heart failure) (HCC)   Type 2 diabetes mellitus with hypoglycemia without coma (HCC)   Chronic respiratory failure with hypoxia (HCC)   Primary cancer of hepatic flexure of colon (HCC)   Small cell lung cancer, left upper lobe ([place])   Palliative care by specialist   Do not resuscitate   Recurring acute on chronic GI blood loss anemia-hemoglobin 5.5, tarry stools on rectal exam, in the setting of unresectable colon cancer 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  Per recent discharge notes, patient has declined palliative care and hospice services. -Transfuse 2 units PRBC -Follow-up CBC a.m.  Hyperkalemia-potassium 2.5, likely from diuretics Lasix 40 daily. - Replete -Check magnesium-2.2 -BMP a.m.  Metabolic encephalopathy- awake, alert but confused, unable to confirm respiratory or urinary symptoms. WBC 13.6. Port chest xray Pna vs Lung lesions. - Obtain UA, urine cultures - IV Cefepime for PNA. -CBC, BMP a.m. - 1/2 N/s 75cc/hr x 12 hrs  ?  Abnormal EKG- P waves mostly present not always discernible, rhythm is regular. In the setting of hypokalemia of 2.5, tachycardic with rate of 113.  Normal magnesium 2.2.  Doubt atrial fibrillation, no prior documentation, and regardless is not a candidate for anticoagulation.  Small cell lung  cancer with brain metastases: Chest x-ray showing pneumonia versus lung mass. The patient is not a candidate for any additional chemotherapy.  Continue observation only.  Colorectal cancer, untreated: The patient is not a candidate for any additional chemotherapy.  Continue observation only.  CKD: Stable, creatinine 1.59, baseline ~1.3.  Appears mildly dehydrated. -Hold home Lasix -IV fluids -BMP a.m.  Chronic lower extremity edema: This remained stable during her hospitalization.  She had no signs of cellulitis.  She will continue the Lasix for her edema.  Diabetes mellitus: Random glucose 241, recent A1c 8.4 - SSI- s - Resume homeinsulin  Hypertension: Continue home Lasix  CHF: Stable and compensated, appears clinically dehydrated.  Last echo 2020 EF greater than 75% with grade 1 diastolic dysfunction - Hold home Lasix  Rheumatoid arthritis and chronic back pain: Resume home oxycodone  Depression anxiety: Continue BuSpar, topiramate and Xanax.   DVT prophylaxis: SCds Code Status: DNR Family Communication: None at bedside Disposition Plan:  1- 2 days Consults called:None Admission status:  Obs., Tele    Bethena Roys MD [facility]  07/06/2019, 8:43 PM

## 2019-07-06 NOTE — ED Provider Notes (Signed)
[facility] EMERGENCY DEPARTMENT Provider Note   CSN: [id number] Arrival date & time: 07/06/19  1806     History Chief Complaint  Patient presents with   Abnormal Lab    Stephanie Combs is a 69 y.o. female.  Patient is a DNR.  Patient known to have unresectable colon cancer.  Also metastatic small cell lung CA.  Patient nonambulatory.  Past medical history significant for congestive heart failure diabetes COPD.  Patient sent in from [place] home brought in by EMS for hemoglobin of 5.  Patient was frequent evaluations.  Patient seen February 21 for rectal bleeding but was stable for discharge home.  Patient seen January 28 for symptomatic anemia and was admitted.  Patient seen January 7 for anemia and was admitted.  Patient seen December 14 for GI bleed and was admitted.  Patient appears to be confused.  She is alert.  Will follow some basic commands.  But does not seem to comprehend that she is even at the hospital.  And less your reminder.         Past Medical History:  Diagnosis Date   Anxiety    CAD (coronary artery disease)    a. s/p DES to LAD and angioplasty to D1 in 05/2017   Cancer [facility])    CHF (congestive heart failure) (HCC)    Diastolic   Chronic back pain    Chronic pain    COPD (chronic obstructive pulmonary disease) (HCC)    Degenerative disk disease    Diabetes mellitus without complication ([place])    History of medication noncompliance 11/2017   "the patient frequently self-adjusts medications without notifying her physicians"   Hypertension    Hypothyroidism    On home O2    2.5 L N/C prn   Pedal edema     Patient Active Problem List   Diagnosis Date Noted   Acute on chronic anemia 06/11/2019   GI bleed 06/10/2019   Goals of care, counseling/discussion    Advanced care planning/counseling discussion    Palliative care by specialist    Do not resuscitate    Anemia due to GI blood loss 04/25/2019   Pressure injury of  skin 04/12/2019   Fluid overload 04/10/2019   Lung cancer ([place]) 04/10/2019   Acute encephalopathy 04/02/2019   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   Acute on chronic diastolic heart failure ([place]) 10/11/2018   Assistance needed with transportation 09/22/2018   Primary cancer of hepatic flexure of colon ([place])    Small cell lung cancer, left upper lobe (HCC)    Colonic mass 09/10/2018   Colon polyps 09/10/2018   Cellulitis of leg, left 09/10/2018   Pain of lower extremity    Non-intractable vomiting    Abdominal pain, epigastric    Heme positive stool    Acute on chronic diastolic CHF (congestive heart failure) ([place]) 09/06/2018   Symptomatic anemia 08/31/2018   Anemia 08/30/2018   Chronic pain 07/05/2018   On home O2 07/05/2018   Opioid dependence ([place]) 07/05/2018   CAD (coronary artery disease) 05/14/2018   AKI (acute kidney injury) ([place]) 04/05/2018   Cellulitis 01/10/2018   Cellulitis of right lower extremity 01/07/2018   Altered mental status 01/06/2018   Type 2 diabetes mellitus with hypoglycemia without coma ([place]) 11/24/2017   Anxiety 11/24/2017   Chronic respiratory failure with hypoxia ([place]) 11/24/2017   Syncope 10/24/2017   Bradycardia    Syncope due to orthostatic hypotension 09/04/2017   Obesity, Class III,  BMI 40-49.9 (morbid obesity) ([place]) 09/04/2017   Abnormal nuclear stress test 05/19/2017   Atypical angina ([place]) 05/19/2017   Edema 05/15/2017   Skin ulcer ([place]) 01/30/2017   Tinea cruris 10/10/2016   Vitamin D deficiency 08/19/2016   Personal history of noncompliance with medical treatment, presenting hazards to health 06/17/2016   CAP (community acquired pneumonia) 07/25/2015   Pressure ulcer 05/02/2015   Hyponatremia 05/01/2015   Acute-on-chronic kidney injury ([place]) 05/01/2015   Uncontrolled type 2 diabetes mellitus with stage 3 chronic kidney disease ([place]) 05/01/2015   Cellulitis of foot, right  03/24/2015   Peripheral edema 12/04/2014   Acute renal failure superimposed on stage 3 chronic kidney disease ([place]) 11/24/2014   Bilateral lower extremity edema    Diabetic ulcer of right great toe (HCC)    Foot ulcer due to secondary DM ([place]) 11/07/2014   Diabetic ulcer of right foot ([place]) 11/07/2014   Edema of lower extremity 05/27/2013   CKD (chronic kidney disease), stage III ([place]) 04/14/2013   Anemia in CKD (chronic kidney disease) 04/14/2013   Chest pain 04/14/2013   Dyspnea 04/14/2013   Chronic diastolic CHF (congestive heart failure) (HCC)    Hypothyroidism    COPD (chronic obstructive pulmonary disease) ([place]) 08/07/2012   Morbid obesity ([place]) 08/07/2012   DM type 2 (diabetes mellitus, type 2) ([place]) 08/07/2012   HTN (hypertension), benign 08/07/2012   ARF (acute renal failure) ([place]) 08/07/2012    Past Surgical History:  Procedure Laterality Date   BIOPSY  09/09/2018   Procedure: BIOPSY;  Surgeon: Daneil Dolin, MD;  Location: [facility];  Service: Endoscopy;;  gastric esophageal hepatic flexure colon   COLONOSCOPY WITH PROPOFOL N/A 09/09/2018   hepatic flexure colon mass s/p biopsy, with polyps associated with tumor scattered all the way to the cecum not removed, multiple splenic flexure and rectal polyps removed, left-sided diverticulosis, ?right hemicolectomy. Hepatic flexure adenocarcinoma and additional adenocarcinoma in small splenic flexure polyp. Concern for synchronous colon cancer. High risk for surgical resection.    CORONARY STENT INTERVENTION N/A 05/22/2017   Procedure: CORONARY STENT INTERVENTION;  Surgeon: Martinique, Peter M, MD;  Location: [place] CV LAB;  Service: Cardiovascular;  Laterality: N/A;   ESOPHAGOGASTRODUODENOSCOPY (EGD) WITH PROPOFOL N/A 09/09/2018   abnormal distal esophagus suspicious for Barrett's s/p biopsy, erosive gastropathy, duodenal bulbar erosions. +Barrett's   HERNIA REPAIR     POLYPECTOMY  09/09/2018    Procedure: POLYPECTOMY;  Surgeon: Daneil Dolin, MD;  Location: [facility];  Service: Endoscopy;;  colon   RIGHT/LEFT HEART CATH AND CORONARY ANGIOGRAPHY N/A 05/19/2017   Procedure: RIGHT/LEFT HEART CATH AND CORONARY ANGIOGRAPHY;  Surgeon: Leonie Man, MD;  Location: [place] CV LAB;  Service: Cardiovascular;  Laterality: N/A;   TUBAL LIGATION     VIDEO BRONCHOSCOPY WITH ENDOBRONCHIAL ULTRASOUND Left 09/14/2018   Procedure: VIDEO BRONCHOSCOPY WITH ENDOBRONCHIAL ULTRASOUND with biopsies;  Surgeon: Margaretha Seeds, MD;  Location: [place];  Service: Thoracic;  Laterality: Left;     OB History   No obstetric history on file.     Family History  Problem Relation Age of Onset   Stroke Mother        deceased 39    Heart attack Father        deceased 74    Diabetes Brother    Cerebral palsy Brother    Pneumonia Brother    Diabetes Other    Heart attack Other    Colon cancer Neg Hx  Social History   Tobacco Use   Smoking status: Former Smoker    Packs/day: 1.50    Years: 45.00    Pack years: 67.50    Types: Cigarettes    Quit date: 11/24/2014    Years since quitting: 4.6   Smokeless tobacco: Never Used  Substance Use Topics   Alcohol use: No    Alcohol/week: 0.0 standard drinks   Drug use: No    Home Medications Prior to Admission medications   Medication Sig Start Date End Date Taking? Authorizing Provider  albuterol (PROAIR HFA) 108 (90 Base) MCG/ACT inhaler Inhale 2 puffs into the lungs every 4 (four) hours as needed for wheezing or shortness of breath. For shortness of breath 09/22/18   Eugenie Filler, MD  albuterol (PROVENTIL) (2.5 MG/3ML) 0.083% nebulizer solution Take 2.5 mg by nebulization 2 (two) times daily as needed for wheezing or shortness of breath.     [provider]  allopurinol (ZYLOPRIM) 300 MG tablet Take 300 mg by mouth daily.    [provider]  ALPRAZolam Duanne Moron) 1 MG tablet Take 1 tablet (1 mg total) by  mouth 3 (three) times daily as needed for anxiety or sleep. 06/11/19   Manuella Ghazi, Pratik D, DO  Amino Acids-Protein Hydrolys (FEEDING SUPPLEMENT, PRO-STAT SUGAR FREE 64,) LIQD Take 30 mLs by mouth daily. 05/24/19   Maryanna Shape, NP  aspirin EC 81 MG tablet Take 81 mg by mouth daily.    [provider]  atorvastatin (LIPITOR) 80 MG tablet Take 1 tablet (80 mg total) by mouth daily at 6 PM. 05/26/17   [name], MD  bacitracin 500 UNIT/GM ointment Apply 1 application topically 3 (three) times daily.    [provider]  busPIRone (BUSPAR) 7.5 MG tablet Take 1 tablet (7.5 mg total) by mouth 2 (two) times daily. 06/11/19   Manuella Ghazi, Pratik D, DO  cholecalciferol (VITAMIN D3) 25 MCG (1000 UT) tablet Take 5,000 Units by mouth daily.    [provider]  feeding supplement, ENSURE ENLIVE, (ENSURE ENLIVE) LIQD Take 237 mLs by mouth 2 (two) times daily between meals. 05/24/19   Maryanna Shape, NP  furosemide (LASIX) 40 MG tablet Take 1 tablet (40 mg total) by mouth daily. 04/28/19   Roxan Hockey, MD  gabapentin (NEURONTIN) 600 MG tablet Take 600 mg by mouth 3 (three) times daily.     [provider]  hydrocortisone (ANUSOL-HC) 2.5 % rectal cream Place 1 application rectally 4 (four) times daily as needed for hemorrhoids.  01/03/19   [provider]  insulin aspart (NOVOLOG) 100 UNIT/ML injection Inject 0-9 Units into the skin See admin instructions. insulin aspart (novoLOG) injection 0-9 Units 0-9 Units,  Subcutaneous, 3 times daily with meals, CBG < 70: Implement Hypoglycemia Standing Orders and refer to Hypoglycemia Standing Orders sidebar report  CBG 70 - 120: 0 units  CBG 121 - 150: 1 unit  CBG 151 - 200: 2 units  CBG 201 - 250: 3 units  CBG 251 - 300: 5 units  CBG 301 - 350: 7 units  CBG 351 - 400: 9 units CBG > 400: Patient not taking: Reported on 06/11/2019 04/28/19   Roxan Hockey, MD  insulin detemir (LEVEMIR) 100 UNIT/ML injection Inject 0.34 mLs (34  Units total) into the skin at bedtime. Patient not taking: Reported on 06/11/2019 05/24/19   Maryanna Shape, NP  Insulin Glargine (BASAGLAR KWIKPEN) 100 UNIT/ML SOPN Inject 34 Units into the skin daily.  [provider]  insulin lispro (ADMELOG) 100 UNIT/ML injection Inject 1-9 Units into the skin See admin instructions. Inject as per sliding scale : If 121-150 = 1 unit, 151-200=2 units, 201-250=3 units, 251-300=5 units, 301-350=7 units, 351-400=9 units before meals and at bedtime.    [provider]  levothyroxine (SYNTHROID, LEVOTHROID) 200 MCG tablet Take 200 mcg by mouth daily before breakfast.  06/24/18   [provider]  Multiple Vitamin (MULTIVITAMIN WITH MINERALS) TABS tablet Take 1 tablet by mouth daily. 05/25/19   Maryanna Shape, NP  nitroGLYCERIN (NITROSTAT) 0.4 MG SL tablet Place 0.4 mg under the tongue every 5 (five) minutes as needed for chest pain.  03/26/18   [provider]  omeprazole (PRILOSEC) 20 MG capsule Take 20 mg by mouth daily.    [provider]  ondansetron (ZOFRAN) 8 MG tablet Take 1 tablet (8 mg total) by mouth every 8 (eight) hours as needed for nausea or vomiting. 04/28/19   Denton Brick, Courage, MD  oxyCODONE (OXY IR/ROXICODONE) 5 MG immediate release tablet Take 1-2 tablets (5-10 mg total) by mouth every 6 (six) hours as needed for severe pain. 06/11/19   Manuella Ghazi, Pratik D, DO  pantoprazole (PROTONIX) 40 MG tablet Take 1 tablet (40 mg total) by mouth 2 (two) times daily. Patient not taking: Reported on 06/11/2019 10/07/18   Truitt Merle, MD  polyethylene glycol (MIRALAX / GLYCOLAX) 17 g packet Take 17 g by mouth 2 (two) times daily. 04/14/19   Shelly Coss, MD  potassium chloride SA (KLOR-CON) 20 MEQ tablet Take 1 tablet (20 mEq total) by mouth daily. 04/28/19   Roxan Hockey, MD  promethazine (PHENERGAN) 12.5 MG tablet Take 1 tablet (12.5 mg total) by mouth every 6 (six) hours as needed for nausea or vomiting. 02/22/19    Alla Feeling, NP  senna (SENOKOT) 8.6 MG TABS tablet Take 2 tablets (17.2 mg total) by mouth 2 (two) times daily. Patient taking differently: Take 2 tablets by mouth 2 (two) times daily as needed for moderate constipation.  04/28/19   Roxan Hockey, MD  simethicone (MYLICON) 80 MG chewable tablet Chew 1 tablet (80 mg total) by mouth 3 (three) times daily as needed for flatulence. 05/24/19   Maryanna Shape, NP  sucralfate (CARAFATE) 1 g tablet Take 1 tablet (1 g total) by mouth 4 (four) times daily -  with meals and at bedtime. 5 min before meals for radiation induced esophagitis Patient taking differently: Take 1 g by mouth 4 (four) times daily. 5 min before meals for radiation induced esophagitis 12/03/18   Tyler Pita, MD  sulfamethoxazole-trimethoprim (BACTRIM DS) 800-160 MG tablet Take 1 tablet by mouth 2 (two) times daily. 06/03/19   [provider]  tiZANidine (ZANAFLEX) 4 MG tablet Take 1 tablet (4 mg total) by mouth every 8 (eight) hours as needed for muscle spasms. 06/11/19   Manuella Ghazi, Pratik D, DO  topiramate (TOPAMAX) 25 MG tablet Take 75 mg by mouth at bedtime.  03/18/18   [provider]  triamcinolone cream (KENALOG) 0.1 %  05/19/19   [provider]  trolamine salicylate (ASPERCREME/ALOE) 10 % cream Apply 1 application topically as needed for muscle pain. 06/11/19   Manuella Ghazi, Pratik D, DO  umeclidinium-vilanterol (ANORO ELLIPTA) 62.5-25 MCG/INH AEPB Inhale 1 puff into the lungs daily. 05/26/17   [name], MD    Allergies    Dilaudid [hydromorphone hcl]; Midodrine hcl; Actifed cold-allergy [chlorpheniramine-phenylephrine]; Ambrosia artemisiifolia (ragweed) skin test; Antihistamines, chlorpheniramine-type; Doxycycline; Other; Penicillins; Phenylephrine; Reglan [  metoclopramide]; Valium [diazepam]; and Vistaril [hydroxyzine hcl]  Review of Systems   Review of Systems  Unable to perform ROS: Dementia    Physical Exam Updated Vital Signs BP (!) 151/52 (BP  Location: Left Arm)    Pulse (!) 108    Temp 99 F (37.2 C) (Oral)    Resp 18    Ht 1.651 m (5\' 5" )    Wt 112 kg    SpO2 95%    BMI 41.09 kg/m   Physical Exam Vitals and nursing note reviewed.  Constitutional:      General: She is not in acute distress.    Appearance: Normal appearance. She is well-developed.  HENT:     Head: Normocephalic and atraumatic.  Eyes:     Extraocular Movements: Extraocular movements intact.     Conjunctiva/sclera: Conjunctivae normal.     Pupils: Pupils are equal, round, and reactive to light.  Cardiovascular:     Rate and Rhythm: Normal rate and regular rhythm.     Heart sounds: No murmur.  Pulmonary:     Effort: Pulmonary effort is normal. No respiratory distress.     Breath sounds: Normal breath sounds.  Abdominal:     Palpations: Abdomen is soft.     Tenderness: There is no abdominal tenderness.  Genitourinary:    Rectum: Guaiac result positive.     Comments: Grossly melanotic. Musculoskeletal:     Cervical back: Neck supple.     Right lower leg: No edema.     Left lower leg: No edema.     Comments: Both feet are wrapped in pads.  Skin:    General: Skin is warm and dry.  Neurological:     Mental Status: She is alert.     Comments: Patient awake but very confused.  Patient able to communicate some follows commands.     ED Results / Procedures / Treatments   Labs (all labs ordered are listed, but only abnormal results are displayed) Labs Reviewed  CBC WITH DIFFERENTIAL/PLATELET - Abnormal; Notable for the following components:      Result Value   WBC 13.6 (*)    RBC 2.00 (*)    Hemoglobin 5.5 (*)    HCT 19.4 (*)    MCHC 28.4 (*)    RDW 16.9 (*)    Platelets 465 (*)    nRBC 0.8 (*)    Neutro Abs 12.0 (*)    Abs Immature Granulocytes 0.19 (*)    All other components within normal limits  COMPREHENSIVE METABOLIC PANEL - Abnormal; Notable for the following components:   Potassium 2.5 (*)    Glucose, Bld 241 (*)    BUN 89 (*)     Creatinine, Ser 1.59 (*)    Albumin 3.1 (*)    GFR calc non Af Amer 33 (*)    GFR calc Af Amer 38 (*)    All other components within normal limits  BRAIN NATRIURETIC PEPTIDE - Abnormal; Notable for the following components:   B Natriuretic Peptide 230.0 (*)    All other components within normal limits  SARS CORONAVIRUS 2 (TAT 6-24 HRS)  LIPASE, BLOOD  POC OCCULT BLOOD, ED  TYPE AND SCREEN  PREPARE RBC (CROSSMATCH)    EKG EKG Interpretation  Date/Time:  Wednesday July 06 2019 18:27:29 EST Ventricular Rate:  113 PR Interval:    QRS Duration: 99 QT Interval:  308 QTC Calculation: 439 R Axis:   79 Text Interpretation: Atrial fibrillation Low voltage, precordial leads Repol abnrm  suggests ischemia, diffuse leads Minimal ST elevation, lateral leads Interpretation limited secondary to artifact Confirmed by Fredia Sorrow [phone]) on 07/06/2019 6:34:15 PM   Radiology No results found.  Procedures Procedures (including critical care time)  CRITICAL CARE Performed by: Fredia Sorrow Total critical care time: 45 minutes Critical care time was exclusive of separately billable procedures and treating other patients. Critical care was necessary to treat or prevent imminent or life-threatening deterioration. Critical care was time spent personally by me on the following activities: development of treatment plan with patient and/or surrogate as well as nursing, discussions with consultants, evaluation of patient's response to treatment, examination of patient, obtaining history from patient or surrogate, ordering and performing treatments and interventions, ordering and review of laboratory studies, ordering and review of radiographic studies, pulse oximetry and re-evaluation of patient's condition.   Medications Ordered in ED Medications  0.9 %  sodium chloride infusion ( Intravenous New Bag/Given 07/06/19 1940)  0.9 %  sodium chloride infusion (has no administration in time range)    potassium chloride 10 mEq in 100 mL IVPB (has no administration in time range)  potassium chloride 10 mEq in 100 mL IVPB (has no administration in time range)  potassium chloride SA (KLOR-CON) CR tablet 40 mEq (has no administration in time range)    ED Course  I have reviewed the triage vital signs and the nursing notes.  Pertinent labs & imaging results that were available during my care of the patient were reviewed by me and considered in my medical decision making (see chart for details).    MDM Rules/Calculators/A&P                       Patient's hemoglobin 5.5 will mandate blood transfusion.  2 units ordered.  Also potassium critically low at 2.5.  Will give 2 rounds of IV potassium as well as some oral potassium.  Patient is a DNR.  Patient seems to have persistent GI blood loss.  Has melanotic stool.  Patient known to have unresectable colon CA.  Also has metastatic small cell lung cancer.  Patient will require admission for the low hemoglobin the melena and for the hypokalemia.  Renal function does have some renal insufficiency.  Final Clinical Impression(s) / ED Diagnoses Final diagnoses:  Symptomatic anemia  Hypokalemia  Gastrointestinal hemorrhage with melena    Rx / DC Orders ED Discharge Orders    None       Fredia Sorrow, MD 07/06/19 2029

## 2019-07-06 NOTE — ED Notes (Signed)
Date and time results received: 07/06/19 8:05 PM  (use smartphrase ".now" to insert current time)  Test: k Critical Value: 2.5  Name of Provider Notified: [email or web address]  Orders Received? Or Actions Taken?: none at this time awaiting orders

## 2019-07-06 NOTE — ED Triage Notes (Signed)
PT brought in by [facility] from [facility] due to low hemoglobin of a 5.

## 2019-07-06 NOTE — ED Notes (Signed)
CRITICAL VALUE ALERT  Critical Value:  Hgb 5.5  Date & Time Notied:  07/06/2019, 1928  Provider Notified: Dr. Rogene Houston  Orders Received/Actions taken: none at this time

## 2019-07-06 NOTE — Progress Notes (Signed)
Pharmacy Antibiotic Note  Stephanie Combs is a 69 y.o. female admitted on 07/06/2019 with anemia Hgb 5.5.  Pharmacy has been consulted for cefepime dosing for HAP.  Plan: Cefepime 2g IV q12h Monitor renal function and adjust as needed Follow up cultures and adjust/narrow as appropriate  Height: 5\' 5"  (165.1 cm) Weight: 246 lb 14.6 oz (112 kg) IBW/kg (Calculated) : 57  Temp (24hrs), Avg:99 F (37.2 C), Min:99 F (37.2 C), Max:99 F (37.2 C)  Recent Labs  Lab 07/06/19 1901  WBC 13.6*  CREATININE 1.59*    Estimated Creatinine Clearance: 41.6 mL/min (A) (by C-G formula based on SCr of 1.59 mg/dL (H)).    Allergies  Allergen Reactions  . Dilaudid [Hydromorphone Hcl] Itching  . Midodrine Hcl Swelling    After one dose had anaphylactic reaction, had to call EMS.  Marland Kitchen Actifed Cold-Allergy [Chlorpheniramine-Phenylephrine]     "I was sick and red and it didn't agree with me at all"  . Ambrosia Artemisiifolia (Ragweed) Skin Test   . Antihistamines, Chlorpheniramine-Type   . Doxycycline Nausea And Vomiting  . Other Cough    Pt states she is allergic to ragweed and that she starts coughing and sneezing like crazy  . Penicillins Hives and Itching    Tolerates Rocephin Has patient had a PCN reaction causing immediate rash, facial/tongue/throat swelling, SOB or lightheadedness with hypotension: Yes Has patient had a PCN reaction causing severe rash involving mucus membranes or skin necrosis: No Has patient had a PCN reaction that required hospitalization Yes Has patient had a PCN reaction occurring within the last 10 years: No If all of the above answers are "NO", then may proceed with Cephalosporin use.   Marland Kitchen Phenylephrine   . Reglan [Metoclopramide] Itching  . Valium [Diazepam] Itching  . Vistaril [Hydroxyzine Hcl] Itching    Antimicrobials this admission: 2/24 Cefepime >>  Dose adjustments this admission:  Microbiology results: 2/24 SARS CoV2: 2/24 UCx:  Thank you for  allowing pharmacy to be a part of this patient's care.  Peggyann Juba, PharmD, BCPS Pharmacy: [phone] 07/06/2019 10:14 PM

## 2019-07-07 DIAGNOSIS — Z66 Do not resuscitate: Secondary | ICD-10-CM | POA: Diagnosis present

## 2019-07-07 DIAGNOSIS — J440 Chronic obstructive pulmonary disease with acute lower respiratory infection: Secondary | ICD-10-CM | POA: Diagnosis present

## 2019-07-07 DIAGNOSIS — D500 Iron deficiency anemia secondary to blood loss (chronic): Secondary | ICD-10-CM | POA: Diagnosis present

## 2019-07-07 DIAGNOSIS — C19 Malignant neoplasm of rectosigmoid junction: Secondary | ICD-10-CM | POA: Diagnosis present

## 2019-07-07 DIAGNOSIS — M069 Rheumatoid arthritis, unspecified: Secondary | ICD-10-CM | POA: Diagnosis present

## 2019-07-07 DIAGNOSIS — Z515 Encounter for palliative care: Secondary | ICD-10-CM | POA: Diagnosis not present

## 2019-07-07 DIAGNOSIS — F418 Other specified anxiety disorders: Secondary | ICD-10-CM | POA: Diagnosis present

## 2019-07-07 DIAGNOSIS — G9341 Metabolic encephalopathy: Secondary | ICD-10-CM | POA: Diagnosis present

## 2019-07-07 DIAGNOSIS — C7931 Secondary malignant neoplasm of brain: Secondary | ICD-10-CM | POA: Diagnosis present

## 2019-07-07 DIAGNOSIS — M549 Dorsalgia, unspecified: Secondary | ICD-10-CM | POA: Diagnosis present

## 2019-07-07 DIAGNOSIS — C3412 Malignant neoplasm of upper lobe, left bronchus or lung: Secondary | ICD-10-CM

## 2019-07-07 DIAGNOSIS — Z888 Allergy status to other drugs, medicaments and biological substances status: Secondary | ICD-10-CM | POA: Diagnosis not present

## 2019-07-07 DIAGNOSIS — N390 Urinary tract infection, site not specified: Secondary | ICD-10-CM | POA: Diagnosis present

## 2019-07-07 DIAGNOSIS — J9611 Chronic respiratory failure with hypoxia: Secondary | ICD-10-CM

## 2019-07-07 DIAGNOSIS — K921 Melena: Secondary | ICD-10-CM

## 2019-07-07 DIAGNOSIS — Z885 Allergy status to narcotic agent status: Secondary | ICD-10-CM | POA: Diagnosis not present

## 2019-07-07 DIAGNOSIS — Z7189 Other specified counseling: Secondary | ICD-10-CM

## 2019-07-07 DIAGNOSIS — Z881 Allergy status to other antibiotic agents status: Secondary | ICD-10-CM | POA: Diagnosis not present

## 2019-07-07 DIAGNOSIS — I5032 Chronic diastolic (congestive) heart failure: Secondary | ICD-10-CM

## 2019-07-07 DIAGNOSIS — J189 Pneumonia, unspecified organism: Secondary | ICD-10-CM | POA: Diagnosis present

## 2019-07-07 DIAGNOSIS — J449 Chronic obstructive pulmonary disease, unspecified: Secondary | ICD-10-CM | POA: Diagnosis present

## 2019-07-07 DIAGNOSIS — I130 Hypertensive heart and chronic kidney disease with heart failure and stage 1 through stage 4 chronic kidney disease, or unspecified chronic kidney disease: Secondary | ICD-10-CM | POA: Diagnosis present

## 2019-07-07 DIAGNOSIS — D649 Anemia, unspecified: Secondary | ICD-10-CM | POA: Diagnosis not present

## 2019-07-07 DIAGNOSIS — E876 Hypokalemia: Secondary | ICD-10-CM | POA: Diagnosis present

## 2019-07-07 DIAGNOSIS — I2510 Atherosclerotic heart disease of native coronary artery without angina pectoris: Secondary | ICD-10-CM | POA: Diagnosis present

## 2019-07-07 DIAGNOSIS — Z20822 Contact with and (suspected) exposure to covid-19: Secondary | ICD-10-CM | POA: Diagnosis present

## 2019-07-07 DIAGNOSIS — Z880 Allergy status to penicillin: Secondary | ICD-10-CM | POA: Diagnosis not present

## 2019-07-07 DIAGNOSIS — G8929 Other chronic pain: Secondary | ICD-10-CM | POA: Diagnosis present

## 2019-07-07 DIAGNOSIS — C183 Malignant neoplasm of hepatic flexure: Secondary | ICD-10-CM

## 2019-07-07 LAB — CBC
HCT: 17 % — ABNORMAL LOW (ref 36.0–46.0)
HCT: 25.3 % — ABNORMAL LOW (ref 36.0–46.0)
Hemoglobin: 4.9 g/dL — CL (ref 12.0–15.0)
Hemoglobin: 7.6 g/dL — ABNORMAL LOW (ref 12.0–15.0)
MCH: 27.5 pg (ref 26.0–34.0)
MCH: 26.6 pg (ref 26.0–34.0)
MCHC: 28.8 g/dL — ABNORMAL LOW (ref 30.0–36.0)
MCHC: 30 g/dL (ref 30.0–36.0)
MCV: 95.5 fL (ref 80.0–100.0)
MCV: 88.5 fL (ref 80.0–100.0)
Platelets: 383 10*3/uL (ref 150–400)
Platelets: 303 10*3/uL (ref 150–400)
RBC: 1.78 MIL/uL — ABNORMAL LOW (ref 3.87–5.11)
RBC: 2.86 MIL/uL — ABNORMAL LOW (ref 3.87–5.11)
RDW: 16.7 % — ABNORMAL HIGH (ref 11.5–15.5)
RDW: 19.6 % — ABNORMAL HIGH (ref 11.5–15.5)
WBC: 10.8 10*3/uL — ABNORMAL HIGH (ref 4.0–10.5)
WBC: 9.8 10*3/uL (ref 4.0–10.5)
nRBC: 1.3 % — ABNORMAL HIGH (ref 0.0–0.2)
nRBC: 0.8 % — ABNORMAL HIGH (ref 0.0–0.2)

## 2019-07-07 LAB — BASIC METABOLIC PANEL
Anion gap: 14 (ref 5–15)
BUN: 90 mg/dL — ABNORMAL HIGH (ref 8–23)
CO2: 24 mmol/L (ref 22–32)
Calcium: 9.2 mg/dL (ref 8.9–10.3)
Chloride: 106 mmol/L (ref 98–111)
Creatinine, Ser: 1.57 mg/dL — ABNORMAL HIGH (ref 0.44–1.00)
GFR calc Af Amer: 39 mL/min — ABNORMAL LOW (ref >60)
GFR calc non Af Amer: 33 mL/min — ABNORMAL LOW (ref >60)
Glucose, Bld: 215 mg/dL — ABNORMAL HIGH (ref 70–99)
Potassium: 3.1 mmol/L — ABNORMAL LOW (ref 3.5–5.1)
Sodium: 144 mmol/L (ref 135–145)

## 2019-07-07 LAB — URINALYSIS, ROUTINE W REFLEX MICROSCOPIC
Bilirubin Urine: NEGATIVE
Glucose, UA: NEGATIVE mg/dL
Hgb urine dipstick: NEGATIVE
Ketones, ur: NEGATIVE mg/dL
Nitrite: POSITIVE — AB
Protein, ur: 30 mg/dL — AB
Specific Gravity, Urine: 1.015 (ref 1.005–1.030)
pH: 8 (ref 5.0–8.0)

## 2019-07-07 LAB — GLUCOSE, CAPILLARY
Glucose-Capillary: 191 mg/dL — ABNORMAL HIGH (ref 70–99)
Glucose-Capillary: 291 mg/dL — ABNORMAL HIGH (ref 70–99)

## 2019-07-07 LAB — SARS CORONAVIRUS 2 (TAT 6-24 HRS): SARS Coronavirus 2: NEGATIVE

## 2019-07-07 MED ORDER — BUSPIRONE HCL 5 MG PO TABS
7.5000 mg | ORAL_TABLET | Freq: Two times a day (BID) | ORAL | Status: DC
  Administered 2019-07-07 – 2019-07-08 (×4): 7.5 mg via ORAL
  Filled 2019-07-07 (×4): qty 2

## 2019-07-07 MED ORDER — POTASSIUM CHLORIDE CRYS ER 20 MEQ PO TBCR
20.0000 meq | EXTENDED_RELEASE_TABLET | Freq: Every day | ORAL | Status: DC
  Administered 2019-07-07: 20 meq via ORAL
  Filled 2019-07-07 (×2): qty 1

## 2019-07-07 MED ORDER — ATORVASTATIN CALCIUM 40 MG PO TABS: 80.0000 mg | ORAL_TABLET | Freq: Every day | ORAL | Status: DC

## 2019-07-07 MED ORDER — INSULIN GLARGINE 100 UNIT/ML ~~LOC~~ SOLN
34.0000 [IU] | Freq: Every day | SUBCUTANEOUS | Status: DC
  Administered 2019-07-07 (×2): 34 [IU] via SUBCUTANEOUS
  Filled 2019-07-07 (×5): qty 0.34

## 2019-07-07 MED ORDER — POTASSIUM CHLORIDE CRYS ER 20 MEQ PO TBCR
40.0000 meq | EXTENDED_RELEASE_TABLET | ORAL | Status: AC
  Administered 2019-07-07: 40 meq via ORAL
  Filled 2019-07-07: qty 2

## 2019-07-07 MED ORDER — SUCRALFATE 1 G PO TABS
1.0000 g | ORAL_TABLET | Freq: Three times a day (TID) | ORAL | Status: DC
  Administered 2019-07-07 – 2019-07-08 (×3): 1 g via ORAL
  Filled 2019-07-07 (×4): qty 1

## 2019-07-07 MED ORDER — ACETAMINOPHEN 650 MG RE SUPP: 650.0000 mg | Freq: Four times a day (QID) | RECTAL | Status: DC | PRN

## 2019-07-07 MED ORDER — ENSURE ENLIVE PO LIQD
237.0000 mL | Freq: Two times a day (BID) | ORAL | Status: DC
  Administered 2019-07-07 – 2019-07-08 (×3): 237 mL via ORAL

## 2019-07-07 MED ORDER — ALLOPURINOL 300 MG PO TABS
300.0000 mg | ORAL_TABLET | Freq: Every day | ORAL | Status: DC
  Administered 2019-07-07 – 2019-07-08 (×2): 300 mg via ORAL
  Filled 2019-07-07 (×2): qty 1

## 2019-07-07 MED ORDER — POTASSIUM CHLORIDE CRYS ER 20 MEQ PO TBCR
40.0000 meq | EXTENDED_RELEASE_TABLET | Freq: Once | ORAL | Status: AC
  Administered 2019-07-07: 40 meq via ORAL
  Filled 2019-07-07: qty 2

## 2019-07-07 MED ORDER — FUROSEMIDE 10 MG/ML IJ SOLN: 40.0000 mg | Freq: Once | INTRAMUSCULAR | Status: DC

## 2019-07-07 MED ORDER — ONDANSETRON HCL 4 MG/2ML IJ SOLN: 4.0000 mg | Freq: Four times a day (QID) | INTRAMUSCULAR | Status: DC | PRN

## 2019-07-07 MED ORDER — ONDANSETRON HCL 4 MG PO TABS
4.0000 mg | ORAL_TABLET | Freq: Four times a day (QID) | ORAL | Status: DC | PRN
  Filled 2019-07-07: qty 1

## 2019-07-07 MED ORDER — GABAPENTIN 300 MG PO CAPS
600.0000 mg | ORAL_CAPSULE | Freq: Three times a day (TID) | ORAL | Status: DC
  Administered 2019-07-07 – 2019-07-08 (×4): 600 mg via ORAL
  Filled 2019-07-07 (×4): qty 2

## 2019-07-07 MED ORDER — ALPRAZOLAM 1 MG PO TABS
1.0000 mg | ORAL_TABLET | Freq: Three times a day (TID) | ORAL | Status: DC | PRN
  Administered 2019-07-08: 1 mg via ORAL
  Filled 2019-07-07: qty 1

## 2019-07-07 MED ORDER — LEVOTHYROXINE SODIUM 100 MCG PO TABS
200.0000 ug | ORAL_TABLET | Freq: Every day | ORAL | Status: DC
  Administered 2019-07-07 – 2019-07-08 (×2): 200 ug via ORAL
  Filled 2019-07-07 (×2): qty 2

## 2019-07-07 MED ORDER — BASAGLAR KWIKPEN 100 UNIT/ML ~~LOC~~ SOPN: 34.0000 [IU] | PEN_INJECTOR | Freq: Every day | SUBCUTANEOUS | Status: DC

## 2019-07-07 MED ORDER — SENNA 8.6 MG PO TABS
2.0000 | ORAL_TABLET | Freq: Two times a day (BID) | ORAL | Status: DC
  Administered 2019-07-07 – 2019-07-08 (×3): 17.2 mg via ORAL
  Filled 2019-07-07 (×4): qty 2

## 2019-07-07 MED ORDER — SODIUM CHLORIDE 0.45 % IV SOLN: INTRAVENOUS | Status: AC

## 2019-07-07 MED ORDER — OXYCODONE HCL 5 MG PO TABS: 5.0000 mg | ORAL_TABLET | Freq: Four times a day (QID) | ORAL | Status: DC | PRN

## 2019-07-07 MED ORDER — ACETAMINOPHEN 325 MG PO TABS: 650.0000 mg | ORAL_TABLET | Freq: Four times a day (QID) | ORAL | Status: DC | PRN

## 2019-07-07 MED ORDER — PANTOPRAZOLE SODIUM 40 MG PO TBEC
40.0000 mg | DELAYED_RELEASE_TABLET | Freq: Two times a day (BID) | ORAL | Status: DC
  Administered 2019-07-07 – 2019-07-08 (×4): 40 mg via ORAL
  Filled 2019-07-07 (×4): qty 1

## 2019-07-07 MED ORDER — TOPIRAMATE 25 MG PO TABS
75.0000 mg | ORAL_TABLET | Freq: Every day | ORAL | Status: DC
  Administered 2019-07-07 (×2): 75 mg via ORAL
  Filled 2019-07-07 (×2): qty 3

## 2019-07-07 NOTE — TOC Initial Note (Signed)
Transition of Care [facility]) - Initial/Assessment Note    Patient Details  Name: Stephanie Combs MRN: [id number] Date of Birth: 1951-01-28  Transition of Care [facility]) CM/SW Contact:    Boneta Lucks, RN Phone Number: 07/07/2019, 11:59 AM  Clinical Narrative:    Patient admitted from [place]. Patient HGB 4.1. [name] on the phone with Caryl Pina RN and Wells Guiles RN to get permission for blood transfusion.  Patient declining with cancer. MD on the phone [name] to discuss, options for future planning of treatment, Palliative vs Hospice.  TOC to follow            Expected Discharge Plan: [place] Barriers to Discharge: Continued Medical Work up   Patient Goals and CMS Choice Patient states their goals for this hospitalization and ongoing recovery are:: to go back to SNF. CMS Medicare.gov Compare Post Acute Care list provided to:: Patient Represenative (must comment) Choice offered to / list presented to : Adult Children  Expected Discharge Plan and Services Expected Discharge Plan: [place]       Prior Living Arrangements/Services   Lives with:: Facility Resident          Need for Family Participation in Patient Care: Yes (Comment) Care giver support system in place?: Yes (comment)   Criminal Activity/Legal Involvement Pertinent to Current Situation/Hospitalization: No - Comment as needed  Activities of Daily Living Home Assistive Devices/Equipment: None ADL Screening (condition at time of admission) Patient's cognitive ability adequate to safely complete daily activities?: No Is the patient deaf or have difficulty hearing?: No Does the patient have difficulty seeing, even when wearing glasses/contacts?: No Does the patient have difficulty concentrating, remembering, or making decisions?: Yes Patient able to express need for assistance with ADLs?: Yes Does the patient have difficulty dressing or bathing?: Yes Independently performs ADLs?:  No Communication: Independent Dressing (OT): Independent Grooming: Independent Feeding: Independent Bathing: Appropriate for developmental age 31: Needs assistance Is this a change from baseline?: Pre-admission baseline In/Out Bed: Needs assistance Is this a change from baseline?: Pre-admission baseline Walks in Home: Needs assistance Is this a change from baseline?: Pre-admission baseline Does the patient have difficulty walking or climbing stairs?: Yes Weakness of Legs: Both Weakness of Arms/Hands: Both  Permission Sought/Granted      Share Information with NAME: [name]     Permission granted to share info w Relationship: Daughter     Emotional Assessment     Affect (typically observed): Unable to Assess Orientation: : Oriented to Self Alcohol / Substance Use: Not Applicable Psych Involvement: No (comment)  Admission diagnosis:  Hypokalemia [E87.6] Gastrointestinal hemorrhage with melena [K92.1] Symptomatic anemia [D64.9] Acute on chronic anemia [D64.9] Patient Active Problem List   Diagnosis Date Noted  . Acute on chronic anemia 06/11/2019  . GI bleed 06/10/2019  . Goals of care, counseling/discussion   . Advanced care planning/counseling discussion   . Palliative care by specialist   . Do not resuscitate   . Anemia due to GI blood loss 04/25/2019  . Pressure injury of skin 04/12/2019  . Fluid overload 04/10/2019  . Lung cancer ([place]) 04/10/2019  . Acute encephalopathy 04/02/2019  . Secondary malignant neoplasm of brain ([place]) 03/09/2019  . Acute on chronic diastolic heart failure ([place]) 10/11/2018  . Assistance needed with transportation 09/22/2018  . Primary cancer of hepatic flexure of colon ([place])   . Small cell lung cancer, left upper lobe ([place])   . Colonic mass 09/10/2018  . Colon polyps 09/10/2018  . Cellulitis of leg, left  09/10/2018  . Pain of lower extremity   . Non-intractable vomiting   . Abdominal pain, epigastric   . Heme positive stool    . Acute on chronic diastolic CHF (congestive heart failure) ([place]) 09/06/2018  . Symptomatic anemia 08/31/2018  . Anemia 08/30/2018  . Chronic pain 07/05/2018  . On home O2 07/05/2018  . Opioid dependence ([place]) 07/05/2018  . CAD (coronary artery disease) 05/14/2018  . AKI (acute kidney injury) ([place]) 04/05/2018  . Cellulitis 01/10/2018  . Cellulitis of right lower extremity 01/07/2018  . Altered mental status 01/06/2018  . Type 2 diabetes mellitus with hypoglycemia without coma ([place]) 11/24/2017  . Anxiety 11/24/2017  . Chronic respiratory failure with hypoxia ([place]) 11/24/2017  . Syncope 10/24/2017  . Bradycardia   . Syncope due to orthostatic hypotension 09/04/2017  . Obesity, Class III, BMI 40-49.9 (morbid obesity) ([place]) 09/04/2017  . Abnormal nuclear stress test 05/19/2017  . Atypical angina ([place]) 05/19/2017  . Edema 05/15/2017  . Skin ulcer ([place]) 01/30/2017  . Tinea cruris 10/10/2016  . Vitamin D deficiency 08/19/2016  . Personal history of noncompliance with medical treatment, presenting hazards to health 06/17/2016  . CAP (community acquired pneumonia) 07/25/2015  . Pressure ulcer 05/02/2015  . Hyponatremia 05/01/2015  . Acute-on-chronic kidney injury ([place]) 05/01/2015  . Uncontrolled type 2 diabetes mellitus with stage 3 chronic kidney disease ([place]) 05/01/2015  . Cellulitis of foot, right 03/24/2015  . Peripheral edema 12/04/2014  . Acute renal failure superimposed on stage 3 chronic kidney disease ([place]) 11/24/2014  . Bilateral lower extremity edema   . Diabetic ulcer of right great toe ([place])   . Foot ulcer due to secondary DM ([place]) 11/07/2014  . Diabetic ulcer of right foot ([place]) 11/07/2014  . Edema of lower extremity 05/27/2013  . CKD (chronic kidney disease), stage III ([place]) 04/14/2013  . Anemia in CKD (chronic kidney disease) 04/14/2013  . Chest pain 04/14/2013  . Dyspnea 04/14/2013  . Chronic diastolic CHF (congestive heart failure) ([place])   . Hypothyroidism   .  COPD (chronic obstructive pulmonary disease) ([place]) 08/07/2012  . Morbid obesity ([place]) 08/07/2012  . DM type 2 (diabetes mellitus, type 2) ([place]) 08/07/2012  . HTN (hypertension), benign 08/07/2012  . ARF (acute renal failure) ([place]) 08/07/2012   PCP:  [name], MD Pharmacy:   [facility], [place] [address] [place] [address] Phone: [phone] Fax: [phone]  [place], [name] - [name] [address] Phone: [phone] Fax: [phone]   Readmission Risk Interventions Readmission Risk Prevention Plan 04/28/2019 04/11/2019 10/13/2018  Transportation Screening Complete Complete Complete  Medication Review (RN Care Manager) Complete Complete Complete  PCP or Specialist appointment within 3-5 days of discharge Complete Complete (No Data)  PCP/Specialist Appt Not Complete comments - - Recent admit; remains acutely ill  HRI or Home Care Consult Complete Complete Complete  SW Recovery Care/Counseling Consult Complete Not Complete Complete  SW Consult Not Complete Comments - not appropriate -  Palliative Care Screening Complete Not Applicable Not Applicable  Skilled Nursing Facility Complete Not Applicable Not Applicable  Some recent data might be hidden

## 2019-07-07 NOTE — TOC Progression Note (Signed)
Transition of Care [facility]) - Progression Note    Patient Details  Name: Stephanie Combs MRN: [id number] Date of Birth: [birth year]  Transition [facility]) CM/SW Contact  Boneta Lucks, RN Phone Number: 07/07/2019, 4:54 PM  Clinical Narrative:   Palliative consult completed. Family electing for Hospice House.  CM spoke with daughter- [name] to confirm she wanted Hospice house, vs [place] with Hospice. Jackelyn Poling is out this afternoon, left a message with switch board at [facility] with the update that patient would go to [facility]. MD updated, need Covid test.  Referred to [place]. She is out of the office now, and will work on admission first in the morning.      Expected Discharge Plan: [place] Barriers to Discharge: Continued Medical Work up  Expected Discharge Plan and Services Expected Discharge Plan: [place]  Living arrangements for the past 2 months: [place]                   Readmission Risk Interventions Readmission Risk Prevention Plan 04/28/2019 04/11/2019 10/13/2018  Transportation Screening Complete Complete Complete  Medication Review Press photographer) Complete Complete Complete  PCP or Specialist appointment within 3-5 days of discharge Complete Complete (No Data)  PCP/Specialist Appt Not Complete comments - - Recent admit; remains acutely ill  HRI or Home Care Consult Complete Complete Complete  SW Recovery Care/Counseling Consult Complete Not Complete Complete  SW Consult Not Complete Comments - not appropriate -  Palliative Care Screening Complete Not Applicable Not Applicable  Skilled Nursing Facility Complete Not Applicable Not Applicable  Some recent data might be hidden

## 2019-07-07 NOTE — Progress Notes (Signed)
PMT consult received and chart reviewed. Discussed with Dr. Denton Brick. Patient is pleasantly confused and unable to participate in [place] discussion. No family at bedside Spoke with daughter, [name] via telephone to discuss [place]. Reviewed in detail events leading to her overall health decline including diagnoses, interventions, plan of care, and poor prognosis. Explained that oncology recommended hospice services mid-January. Explained that she is eligible for hospice facility with recurrent anemia requiring recurrent hospital admission for blood transfusions secondary to GI bleeding from unresectable colon cancer. Daughter confirms DNR/DNI code status. Daughter ready for transition to comfort focused care plan including transition to hospice facility for EOL care. Daughter understands hospice philosophy and no further blood transfusions or aggressive medical management. TOC team notified. Updated Dr. Denton Brick and RN.   Full palliative note to follow.   NO CHARGE  Ihor Dow, [place], FNP-C Palliative Medicine Team  Phone: [phone] Fax: [phone]

## 2019-07-07 NOTE — Progress Notes (Signed)
Patient Demographics:    Stephanie Combs, is a 69 y.o. female, DOB - 1951-03-04, XYB:[id number]  Admit date - 07/06/2019   Admitting Physician Ejiroghene Arlyce Dice, MD  Outpatient Primary MD for the patient is [name], MD  LOS - 0   Chief Complaint  Patient presents with  . Abnormal Lab        Subjective:    Stephanie Combs today has no fevers, no emesis,  No chest pain,   -Resting comfortably  Assessment  & Plan :    Principal Problem:   Acute on chronic anemia Active Problems:   COPD (chronic obstructive pulmonary disease) (HCC)   DM type 2 (diabetes mellitus, type 2) (HCC)   HTN (hypertension), benign   CKD (chronic kidney disease), stage III (HCC)   Chronic diastolic CHF (congestive heart failure) (HCC)   Type 2 diabetes mellitus with hypoglycemia without coma (HCC)   Chronic respiratory failure with hypoxia (HCC)   Primary cancer of hepatic flexure of colon (HCC)   Small cell lung cancer, left upper lobe ([place])   Palliative care by specialist   Do not resuscitate  Brief Summary:-  69 y.o. 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   patient has declined palliative care and hospice services. -Transfuse 2 units PRBC --Discussed with patient's daughter awaiting further palliative care conversation this admission  Hyperkalemia-potassium 2.5, likely from diuretics Lasix 40 daily. - Replete and recheck -magnesium-2.2 -BMP a.m.  Metabolic encephalopathy-may be related  to severe symptomatic anemia and pneumonia awake, alert but confused, unable to confirm respiratory or urinary symptoms.  -Leukocytosis noted,-Continue IV Cefepime for PNA.  Small cell lung cancer with brain metastases: Chest x-ray showing pneumonia versus lung mass. The patient is not a candidate for any additional chemotherapy. Further palliative care conversation pending  Colorectal cancer, untreated: The patient is not a candidate for any additional chemotherapy.   CKD: Stable, creatinine 1.59, baseline ~1.3.  Appears mildly dehydrated. -Hold home Lasix -IV fluids -BMP a.m.  Chronic lower extremity edema: -Usually gets Lasix, currently on hold  Diabetes mellitus: Random glucose 241, recent A1c 8.4 - SSI- s - Resume homeinsulin  Hypertension: Continue home Lasix  CHF: Stable and compensated, appears clinically dehydrated.  Last echo 2020 EF greater than 75% with grade 1 diastolic dysfunction - Hold home Lasix  Rheumatoid arthritis and chronic back pain: Resume home oxycodone  Depression anxiety: Continue BuSpar, topiramate and Xanax.  Social/Ethics--- palliative care consult appreciated Daughter request that no further transfusion after this admission -Patient to transition to palliative/hospice care at [facility] versus residential hospice -[facility] working with palliative care team and daughter   DVT prophylaxis: SCds Code Status: DNR Family Communication: None at bedside Disposition/Need for in-Hospital Stay- patient unable to be discharged at this time due to -Patient to transition to palliative/[facility] care at [facility] versus residential hospice  Code  Status : dnr  Consults  : Palliative  DVT Prophylaxis  :  SCDs   Lab Results  Component Value Date   PLT 383 07/07/2019    Inpatient Medications  Scheduled Meds: . allopurinol  300 mg Oral Daily  . busPIRone  7.5 mg Oral BID  . feeding supplement (ENSURE ENLIVE)  237 mL Oral BID BM  .  furosemide  40 mg Intravenous Once  . gabapentin  600 mg Oral TID  . insulin glargine  34 Units Subcutaneous QHS  . levothyroxine  200 mcg Oral QAC breakfast  . pantoprazole  40 mg Oral BID  . potassium chloride SA  20 mEq Oral Daily  . potassium chloride  40 mEq Oral Q3H  . senna  2 tablet Oral BID  . sucralfate  1 g Oral TID WC & HS  . topiramate  75 mg Oral QHS   Continuous Infusions: . sodium chloride    . ceFEPime (MAXIPIME) IV 2 g (07/07/19 0055)   PRN Meds:.acetaminophen **OR** acetaminophen, ALPRAZolam, ondansetron **OR** ondansetron (ZOFRAN) IV, oxyCODONE    Anti-infectives (From admission, onward)   Start     Dose/Rate Route Frequency Ordered Stop   07/06/19 2300  ceFEPIme (MAXIPIME) 2 g in sodium chloride 0.9 % 100 mL IVPB     2 g 200 mL/hr over 30 Minutes Intravenous Every 12 hours 07/06/19 2229          Objective:   Vitals:   07/07/19 1500 07/07/19 1606 07/07/19 1618 07/07/19 1838  BP: (!) 133/56 (!) 138/57  133/71  Pulse: 86 95 99 88  Resp: 16     Temp: 97.7 F (36.5 C) 98.5 F (36.9 C)  98.9 F (37.2 C)  TempSrc: Oral Axillary  Axillary  SpO2: 100%  100%   Weight:      Height:        Wt Readings from Last 3 Encounters:  07/06/19 104.1 kg  06/10/19 113.4 kg  05/19/19 132.6 kg     Intake/Output Summary (Last 24 hours) at 07/07/2019 1947 Last data filed at 07/07/2019 1700 Gross per 24 hour  Intake 1508.91 ml  Output 1351 ml  Net 157.91 ml     Physical Exam  Gen:- Awake Alert, resting comfortably HEENT:- [place].AT, No sclera icterus Neck-Supple Neck,No JVD,.  Lungs-  CTAB , fair symmetrical air movement CV- S1, S2 normal, regular  Abd-  +ve B.Sounds, Abd Soft, No tenderness,    Extremity/Skin:- No  edema, pedal pulses present  Psych-affect is appropriate.,  Intermittently confused Neuro-generalized weakness, no new focal deficits, no tremors   Data Review:   Micro Results Recent Results (from the past 240 hour(s))  SARS CORONAVIRUS 2  (TAT 6-24 HRS) Nasopharyngeal Nasopharyngeal Swab     Status: None   Collection Time: 07/06/19  7:33 PM   Specimen: Nasopharyngeal Swab  Result Value Ref Range Status   SARS Coronavirus 2 NEGATIVE NEGATIVE Final    Comment: (NOTE) SARS-CoV-2 target nucleic acids are NOT DETECTED. The SARS-CoV-2 RNA is generally detectable in upper and lower respiratory specimens during the acute phase of infection. Negative results do not preclude SARS-CoV-2 infection, do not rule out co-infections with other pathogens, and should not be used as the sole basis for treatment or other patient management decisions. Negative results must be combined with clinical observations, patient history, and epidemiological information. The expected result is Negative. Fact Sheet for Patients: SugarRoll.be Fact Sheet for Healthcare Providers: https://www.woods-mathews.com/ This test is not yet approved or cleared by the  [place] Architectural technologist and  has been authorized for detection and/or diagnosis of SARS-CoV-2 by FDA under an Print production planner (EUA). This EUA will remain  in effect (meaning this test can be used) for the duration of the COVID-19 declaration under Section 56 4(b)(1) of the Act, 21 U.S.C. section 360bbb-3(b)(1), unless the authorization is terminated or revoked sooner. Performed at [facility], [place] [address] [place], [place] [address]     Radiology Reports DG Chest [place] 1 View  Result Date: 07/06/2019 CLINICAL DATA:  Anemia. EXAM: PORTABLE CHEST 1 VIEW COMPARISON:  06/10/2019 FINDINGS: Patient rotated towards the right. Mild cardiomegaly. Mild patchy pulmonary densities in the mid and lower lungs that are nonspecific. This could represent patchy pneumonia. Mass lesion not excluded. Follow-up when able. IMPRESSION: Patchy densities in both mid and lower lungs. This could represent pneumonia or mass lesion/lesions. Follow-up when able.  Electronically Signed   By: Nelson Chimes M.D.   On: 07/06/2019 20:33   DG Chest Port 1 View  Result Date: 06/10/2019 CLINICAL DATA:  Chest pain EXAM: PORTABLE CHEST 1 VIEW COMPARISON:  05/19/2019 FINDINGS: Previously seen patchy opacity in the left upper lobe has improved with mild residual density. Right lung clear. Heart borderline in size. No effusions or acute bony abnormality. IMPRESSION: Minimal residual left upper lobe opacity, improving since prior study. Electronically Signed   By: Rolm Baptise M.D.   On: 06/10/2019 01:04     CBC Recent Labs  Lab 07/06/19 1901 07/07/19 0021  WBC 13.6* 10.8*  HGB 5.5* 4.9*  HCT 19.4* 17.0*  PLT 465* 383  MCV 97.0 95.5  MCH 27.5 27.5  MCHC 28.4* 28.8*  RDW 16.9* 16.7*  LYMPHSABS 0.7  --   MONOABS 0.6  --   EOSABS 0.0  --   BASOSABS 0.0  --     Chemistries  Recent Labs  Lab 07/06/19 1901 07/07/19 0021  NA 145 144  K 2.5* 3.1*  CL 107 106  CO2 23 24  GLUCOSE 241* 215*  BUN 89* 90*  CREATININE 1.59* 1.57*  CALCIUM 9.4 9.2  MG 2.2  --   AST 18  --   ALT 14  --   ALKPHOS 95  --   BILITOT 0.5  --    ------------------------------------------------------------------------------------------------------------------ No results for input(s): CHOL, HDL, LDLCALC, TRIG, CHOLHDL, LDLDIRECT in the last 72 hours.  Lab Results  Component Value Date   HGBA1C 8.4 (H) 04/11/2019   ------------------------------------------------------------------------------------------------------------------ No results for input(s): TSH, T4TOTAL, T3FREE, THYROIDAB in the last 72 hours.  Invalid input(s): FREET3 ------------------------------------------------------------------------------------------------------------------ No results for input(s): VITAMINB12, FOLATE, FERRITIN, TIBC, IRON, RETICCTPCT in the last 72 hours.  Coagulation profile No results for input(s): INR, PROTIME in the last 168 hours.  No results for input(s): DDIMER in the last 72  hours.  Cardiac Enzymes No results for input(s): CKMB, TROPONINI, MYOGLOBIN in the last 168 hours.  Invalid input(s): CK ------------------------------------------------------------------------------------------------------------------    Component Value Date/Time   BNP 230.0 (H) 07/06/2019 1901     Roxan Hockey M.D on 07/07/2019 at 7:47 PM  Go to [email or web address] - for contact info  [facility] - Office  [phone]

## 2019-07-07 NOTE — Consult Note (Signed)
Consultation Note Date: 07/07/19  Patient Name: Stephanie Combs  DOB: September 04, 1950  MRN: [id number]  Age / Sex: 69 y.o., female  PCP: [name], MD Referring Physician: Roxan Hockey, MD  Reason for Consultation: Establishing goals of care and Terminal Care  HPI/Patient Profile: 69 y.o. female  with past medical history of unresectable colon cancer, metastatic lung cancer to brain, COPD with chronic respiratory failure, diastolic CHF admitted on 2/56/3893 from SNF with hemoglobin of 5.5. Patient with two hospitalizations in January for acute anemia requiring blood transfusions. Oncologist recommended hospice services in January 2021. Palliative medicine consultation for goals of care.   Clinical Assessment and Goals of Care:  I have reviewed medical records, discussed with Dr. [place] Brick and RN, and assessed patient at bedside. She is awake but confused. Unable to participate in [place] discussion. No family at bedside. Spoke with daughter, [name] via telephone.   I introduced Palliative Medicine as specialized medical care for people living with serious illness. It focuses on providing relief from the symptoms and stress of a serious illness.  Patient was in the process of long-term care placement at [name], therefore hospice services was not yet initiated. She was receiving palliative services.   We discussed a brief life review of the patient and then focused on their current illness. Discussed events leading up to admission and course of hospitalization including diagnoses, interventions, plan of care. Reviewed previous oncology/radiation oncology notes and test results with daughter. Explained that Dr. Burr Medico recommended initiation of hospice services in January, explaining that [facility] is no longer a candidate for chemotherapy/radiation or surgical intervention with declining functional status and recurrent  admissions. Frankly and compassionately explained poor prognosis.   Attempted to elicit values and goals of care important to the patient and daughter. Advanced directives, concepts specific to code status, artifical feeding and hydration, and rehospitalization were considered and discussed. [name] confirms her mother's decision for DNR/DNI code status. Reviewed patient's living will that is scanned into EMR. Emmalise has clearly written she would not wish for life-prolonging interventions if terminally ill and incurable condition. Discussed life-prolonging nature of blood transfusions at this point, since there is no fix for her mother's underlying condition. Angel understands.   The difference between aggressive medical intervention and comfort care was considered in light of the patient's goals of care. Introduced hospice options and philosophy. Angel's main goal for her mother is "comfort." [name] is requesting hospice facility placement when discharged. [name] understands no further blood transfusions. Discussed symptom management medications.   Questions and concerns were addressed. Emotional support provided.    SUMMARY OF RECOMMENDATIONS    DNR/DNI, transition to comfort focused care plan.   Patient has documented living will in EMR. Reviewed. Daughter, Willeen Cass.   No further blood transfusions after completion of current transfusion today.   TOC team notified to arrange hospice facility transfer. Daughter understands diagnoses and poor prognosis.   Code Status/Advance Care Planning:  DNR  Symptom Management:   Per attending  Palliative Prophylaxis:   Aspiration, Delirium Protocol,  Frequent Pain Assessment, Oral Care and Turn Reposition  Psycho-social/Spiritual:   Desire for further Chaplaincy support:yes  Additional Recommendations: Caregiving  Support/Resources, Compassionate Wean Education and Education on Hospice  Prognosis:   Poor prognosis likely weeks with recurrent  anemia/GI bleeding secondary to unresectable colon cancer.   Discharge Planning: Hospice facility      Primary Diagnoses: Present on Admission: . Acute on chronic anemia . COPD (chronic obstructive pulmonary disease) ([place]) . HTN (hypertension), benign . Chronic respiratory failure with hypoxia ([place]) . CKD (chronic kidney disease), stage III ([place]) . Chronic diastolic CHF (congestive heart failure) ([place]) . Type 2 diabetes mellitus with hypoglycemia without coma ([place]) . Primary cancer of hepatic flexure of colon ([place]) . Small cell lung cancer, left upper lobe ([place]) . Do not resuscitate   I have reviewed the medical record, interviewed the patient and family, and examined the patient. The following aspects are pertinent.  Past Medical History:  Diagnosis Date  . Anxiety   . CAD (coronary artery disease)    a. s/p DES to LAD and angioplasty to D1 in 05/2017  . Cancer ([place])   . CHF (congestive heart failure) ([facility])    Diastolic  . Chronic back pain   . Chronic pain   . COPD (chronic obstructive pulmonary disease) ([place])   . Degenerative disk disease   . Diabetes mellitus without complication ([place])   . History of medication noncompliance 11/2017   "the patient frequently self-adjusts medications without notifying her physicians"  . Hypertension   . Hypothyroidism   . On home O2    2.5 L N/C prn  . Pedal edema    Social History   Socioeconomic History  . Marital status: Widowed    Spouse name: Not on file  . Number of children: 3  . Years of education: Not on file  . Highest education level: Not on file  Occupational History    Comment: retired from [employer], worked Psychologist, sport and exercise  Tobacco Use  . Smoking status: Former Smoker    Packs/day: 1.50    Years: 45.00    Pack years: 67.50    Types: Cigarettes    Quit date: 11/24/2014    Years since quitting: 4.6  . Smokeless tobacco: Never Used  Substance and Sexual Activity  . Alcohol use: No    Alcohol/week: 0.0 standard  drinks  . Drug use: No  . Sexual activity: Not Currently    Birth control/protection: Surgical  Other Topics Concern  . Not on file  Social History Narrative   Has 3 adopted children. Two live in [place].   Social Determinants of Health   Financial Resource Strain:   . Difficulty of Paying Living Expenses: Not on file  Food Insecurity: Unknown  . Worried About Charity fundraiser in the Last Year: Patient refused  . Ran Out of Food in the Last Year: Patient refused  Transportation Needs: Unknown  . Lack of Transportation (Medical): Patient refused  . Lack of Transportation (Non-Medical): Patient refused  Physical Activity: Unknown  . Days of Exercise per Week: Patient refused  . Minutes of Exercise per Session: Patient refused  Stress: Unknown  . Feeling of Stress : Patient refused  Social Connections: Unknown  . Frequency of Communication with Friends and Family: Patient refused  . Frequency of Social Gatherings with Friends and Family: Patient refused  . Attends Religious Services: Patient refused  . Active Member of Clubs or Organizations: Patient refused  . Attends Archivist Meetings:  Patient refused  . Marital Status: Patient refused   Family History  Problem Relation Age of Onset  . Stroke Mother        deceased 5   . Heart attack Father        deceased 46   . Diabetes Brother   . Cerebral palsy Brother   . Pneumonia Brother   . Diabetes Other   . Heart attack Other   . Colon cancer Neg Hx    Scheduled Meds: . allopurinol  300 mg Oral Daily  . busPIRone  7.5 mg Oral BID  . feeding supplement (ENSURE ENLIVE)  237 mL Oral BID BM  . furosemide  40 mg Intravenous Once  . gabapentin  600 mg Oral TID  . insulin glargine  34 Units Subcutaneous QHS  . levothyroxine  200 mcg Oral QAC breakfast  . pantoprazole  40 mg Oral BID  . potassium chloride SA  20 mEq Oral Daily  . senna  2 tablet Oral BID  . sucralfate  1 g Oral TID WC & HS  . topiramate  75 mg Oral  QHS   Continuous Infusions: . sodium chloride    . ceFEPime (MAXIPIME) IV 2 g (07/08/19 0927)   PRN Meds:.acetaminophen **OR** acetaminophen, ALPRAZolam, ondansetron **OR** ondansetron (ZOFRAN) IV, oxyCODONE Medications Prior to Admission:  Prior to Admission medications   Medication Sig Start Date End Date Taking? Authorizing Provider  albuterol (PROAIR HFA) 108 (90 Base) MCG/ACT inhaler Inhale 2 puffs into the lungs every 4 (four) hours as needed for wheezing or shortness of breath. For shortness of breath 09/22/18  Yes Eugenie Filler, MD  albuterol (PROVENTIL) (2.5 MG/3ML) 0.083% nebulizer solution Take 2.5 mg by nebulization 2 (two) times daily as needed for wheezing or shortness of breath.    Yes [provider]  allopurinol (ZYLOPRIM) 300 MG tablet Take 300 mg by mouth daily.   Yes [provider]  ALPRAZolam Duanne Moron) 1 MG tablet Take 1 tablet (1 mg total) by mouth 3 (three) times daily as needed for anxiety or sleep. Patient taking differently: Take 1 mg by mouth every 8 (eight) hours as needed for anxiety or sleep.  06/11/19  Yes Shah, Pratik D, DO  Amino Acids-Protein Hydrolys (FEEDING SUPPLEMENT, PRO-STAT SUGAR FREE 64,) LIQD Take 30 mLs by mouth daily. 05/24/19  Yes CurcioRoselie Awkward, NP  aspirin EC 81 MG tablet Take 81 mg by mouth daily.   Yes [provider]  atorvastatin (LIPITOR) 80 MG tablet Take 1 tablet (80 mg total) by mouth daily at 6 PM. 05/26/17  Yes [name], MD  busPIRone (BUSPAR) 7.5 MG tablet Take 1 tablet (7.5 mg total) by mouth 2 (two) times daily. 06/11/19  Yes Shah, Pratik D, DO  cholecalciferol (VITAMIN D3) 25 MCG (1000 UT) tablet Take 5,000 Units by mouth daily.   Yes [provider]  feeding supplement, ENSURE ENLIVE, (ENSURE ENLIVE) LIQD Take 237 mLs by mouth 2 (two) times daily between meals. 05/24/19  Yes Curcio, Roselie Awkward, NP  furosemide (LASIX) 40 MG tablet Take 1 tablet (40 mg total) by mouth daily. 04/28/19  Yes Emokpae,  Courage, MD  gabapentin (NEURONTIN) 600 MG tablet Take 600 mg by mouth 3 (three) times daily.    Yes [provider]  hydrocortisone (ANUSOL-HC) 2.5 % rectal cream Place 1 application rectally every 6 (six) hours as needed for hemorrhoids.  01/03/19  Yes [provider]  Insulin Glargine (BASAGLAR KWIKPEN) 100 UNIT/ML SOPN Inject 34 Units  into the skin at bedtime.    Yes [provider]  insulin lispro (ADMELOG) 100 UNIT/ML injection Inject 1-9 Units into the skin See admin instructions. Inject as per sliding scale : If 121-150 = 1 unit, 151-200=2 units, 201-250=3 units, 251-300=5 units, 301-350=7 units, 351-400=9 units before meals and at bedtime.   Yes [provider]  levothyroxine (SYNTHROID, LEVOTHROID) 200 MCG tablet Take 200 mcg by mouth daily before breakfast.  06/24/18  Yes [provider]  Multiple Vitamin (MULTIVITAMIN WITH MINERALS) TABS tablet Take 1 tablet by mouth daily. 05/25/19  Yes Curcio, Roselie Awkward, NP  nitroGLYCERIN (NITROSTAT) 0.4 MG SL tablet Place 0.4 mg under the tongue every 5 (five) minutes as needed for chest pain.  03/26/18  Yes [provider]  ondansetron (ZOFRAN) 8 MG tablet Take 1 tablet (8 mg total) by mouth every 8 (eight) hours as needed for nausea or vomiting. 04/28/19  Yes Emokpae, Courage, MD  oxyCODONE (OXY IR/ROXICODONE) 5 MG immediate release tablet Take 1-2 tablets (5-10 mg total) by mouth every 6 (six) hours as needed for severe pain. Patient taking differently: Take 10 mg by mouth every 6 (six) hours as needed for severe pain.  06/11/19  Yes Shah, Pratik D, DO  OXYGEN Inhale 2 L into the lungs continuous.   Yes [provider]  pantoprazole (PROTONIX) 40 MG tablet Take 1 tablet (40 mg total) by mouth 2 (two) times daily. 10/07/18  Yes Truitt Merle, MD  polyethylene glycol (MIRALAX / GLYCOLAX) 17 g packet Take 17 g by mouth 2 (two) times daily. 04/14/19  Yes Adhikari, Tamsen Meek, MD  potassium chloride SA  (KLOR-CON) 20 MEQ tablet Take 1 tablet (20 mEq total) by mouth daily. 04/28/19  Yes Emokpae, Courage, MD  promethazine (PHENERGAN) 12.5 MG tablet Take 1 tablet (12.5 mg total) by mouth every 6 (six) hours as needed for nausea or vomiting. 02/22/19  Yes Alla Feeling, NP  senna (SENOKOT) 8.6 MG TABS tablet Take 2 tablets (17.2 mg total) by mouth 2 (two) times daily. Patient taking differently: Take 2 tablets by mouth in the morning and at bedtime.  04/28/19  Yes Emokpae, Courage, MD  silver sulfADIAZINE (SILVADENE) 1 % cream Apply 1 application topically 2 (two) times daily.   Yes [provider]  simethicone (MYLICON) 80 MG chewable tablet Chew 1 tablet (80 mg total) by mouth 3 (three) times daily as needed for flatulence. 05/24/19  Yes Curcio, Roselie Awkward, NP  sucralfate (CARAFATE) 1 g tablet Take 1 tablet (1 g total) by mouth 4 (four) times daily -  with meals and at bedtime. 5 min before meals for radiation induced esophagitis Patient taking differently: Take 1 g by mouth 4 (four) times daily. 5 min before meals for radiation induced esophagitis 12/03/18  Yes Tyler Pita, MD  tiZANidine (ZANAFLEX) 4 MG tablet Take 1 tablet (4 mg total) by mouth every 8 (eight) hours as needed for muscle spasms. 06/11/19  Yes Shah, Pratik D, DO  topiramate (TOPAMAX) 25 MG tablet Take 75 mg by mouth at bedtime.  03/18/18  Yes [provider]  trolamine salicylate (ASPERCREME/ALOE) 10 % cream Apply 1 application topically as needed for muscle pain. Patient taking differently: Apply 1 application topically daily as needed for muscle pain. Apply to legs, arm, back, and neck 06/11/19  Yes Manuella Ghazi, Pratik D, DO   Allergies  Allergen Reactions  . Dilaudid [Hydromorphone Hcl] Itching  . Midodrine Hcl Swelling    After one dose had anaphylactic reaction,  had to call EMS.  Marland Kitchen Actifed Cold-Allergy [Chlorpheniramine-Phenylephrine]     "I was sick and red and it didn't agree with me at all"  . Ambrosia  Artemisiifolia (Ragweed) Skin Test   . Antihistamines, Chlorpheniramine-Type   . Doxycycline Nausea And Vomiting  . Other Cough    Pt states she is allergic to ragweed and that she starts coughing and sneezing like crazy  . Penicillins Hives and Itching    Tolerates Rocephin Has patient had a PCN reaction causing immediate rash, facial/tongue/throat swelling, SOB or lightheadedness with hypotension: Yes Has patient had a PCN reaction causing severe rash involving mucus membranes or skin necrosis: No Has patient had a PCN reaction that required hospitalization Yes Has patient had a PCN reaction occurring within the last 10 years: No If all of the above answers are "NO", then may proceed with Cephalosporin use.   Marland Kitchen Phenylephrine   . Reglan [Metoclopramide] Itching  . Valium [Diazepam] Itching  . Vistaril [Hydroxyzine Hcl] Itching   Review of Systems  Unable to perform ROS: Acuity of condition    Physical Exam Vitals and nursing note reviewed.  Constitutional:      Appearance: She is ill-appearing.  Pulmonary:     Effort: No tachypnea, accessory muscle usage or respiratory distress.  Abdominal:     Tenderness: There is no abdominal tenderness.  Skin:    General: Skin is warm and dry.     Coloration: Skin is pale.  Neurological:     Mental Status: She is easily aroused.     Comments: Awake, disoriented  Psychiatric:        Attention and Perception: She is inattentive.        Cognition and Memory: Cognition is impaired.    Vital Signs: BP 123/65 (BP Location: Right Arm)   Pulse 81   Temp 98.2 F (36.8 C) (Oral)   Resp 18   Ht 5\' 6"  (1.676 m)   Wt 104.1 kg   SpO2 99%   BMI 37.04 kg/m  Pain Scale: PAINAD       SpO2: SpO2: 99 % O2 Device:SpO2: 99 % O2 Flow Rate: .O2 Flow Rate (L/min): 3 L/min  IO: Intake/output summary:   Intake/Output Summary (Last 24 hours) at 07/08/2019 1022 Last data filed at 07/08/2019 0900 Gross per 24 hour  Intake 970 ml  Output 1000 ml    Net -30 ml    LBM: Last BM Date: 07/07/19 Baseline Weight: Weight: 112 kg Most recent weight: Weight: 104.1 kg     Palliative Assessment/Data: PPS 30%   Flowsheet Rows     Most Recent Value  Intake Tab  Referral Department  Hospitalist  Unit at Time of Referral  Med/Surg Unit  Palliative Care Primary Diagnosis  Cancer  Palliative Care Type  Return patient Palliative Care  Reason for referral  Clarify Goals of Care  Date first seen by Palliative Care  07/07/19  Clinical Assessment  Palliative Performance Scale Score  30%  Psychosocial & Spiritual Assessment  Palliative Care Outcomes  Patient/Family meeting held?  Yes  Who was at the meeting?  daughter via telephone  Palliative Care Outcomes  Clarified goals of care, Counseled regarding hospice, Provided end of life care assistance, Provided psychosocial or spiritual support, Transitioned to hospice, ACP counseling assistance      Time In: 1500 Time Out: 1615 Time Total: 54min Greater than 50%  of this time was spent counseling and coordinating care related to the above assessment and plan.  Signed  by:  Ihor Dow, DNP, FNP-C Palliative Medicine Team  Phone: [phone] Fax: [phone]   Please contact Palliative Medicine Team phone at [phone] for questions and concerns.  For individual provider: See Shea Evans

## 2019-07-07 NOTE — Plan of Care (Signed)

## 2019-07-08 LAB — GLUCOSE, CAPILLARY
Glucose-Capillary: 153 mg/dL — ABNORMAL HIGH (ref 70–99)
Glucose-Capillary: 272 mg/dL — ABNORMAL HIGH (ref 70–99)

## 2019-07-08 LAB — TYPE AND SCREEN
ABO/RH(D): A POS
Antibody Screen: NEGATIVE
Donor AG Type: NEGATIVE
Donor AG Type: NEGATIVE
Unit division: 0
Unit division: 0

## 2019-07-08 LAB — BPAM RBC
Blood Product Expiration Date: 202103232359
Blood Product Expiration Date: 202103272359
ISSUE DATE / TIME: 202102251214
ISSUE DATE / TIME: 202102251534
Unit Type and Rh: 6200
Unit Type and Rh: 6200

## 2019-07-08 MED ORDER — POTASSIUM CHLORIDE CRYS ER 20 MEQ PO TBCR
40.0000 meq | EXTENDED_RELEASE_TABLET | Freq: Once | ORAL | Status: AC
  Administered 2019-07-08: 09:00:00 40 meq via ORAL
  Filled 2019-07-08: qty 2

## 2019-07-08 MED ORDER — POTASSIUM CHLORIDE CRYS ER 20 MEQ PO TBCR: 20.0000 meq | EXTENDED_RELEASE_TABLET | Freq: Every day | ORAL | Status: DC

## 2019-07-08 MED ORDER — ACETAMINOPHEN 325 MG PO TABS: 650.0000 mg | ORAL_TABLET | Freq: Four times a day (QID) | ORAL | 0 refills | Status: AC | PRN

## 2019-07-08 NOTE — Discharge Instructions (Signed)
Transfer to residential hospice with complete comfort care

## 2019-07-08 NOTE — TOC Transition Note (Signed)
Transition of Care [facility]) - CM/SW Discharge Note   Patient Details  Name: Stephanie Combs MRN: [id number] Date of Birth: Feb 15, 1951  Transition of Care [facility]) CM/SW Contact:  Shade Flood, LCSW Phone Number: 07/08/2019, 12:36 PM   Clinical Narrative:     Pt is stable for dc today. There is a bed at the hospice residence and pt can transfer today. Spoke with pt's daughter who confirms plan for transfer to [facility].  Updated RN and MD. EMS form printed to the floor. RN to call report.  EMS arranged.   There are no other TOC needs for dc.  Final next level of care: [place] Barriers to Discharge: Barriers Resolved   Patient Goals and CMS Choice Patient states their goals for this hospitalization and ongoing recovery are:: to go to hospice house. CMS Medicare.gov Compare Post Acute Care list provided to:: Patient Represenative (must comment) Choice offered to / list presented to : Adult Children  Discharge Placement                       Discharge Plan and Services                                     Social Determinants of Health (SDOH) Interventions     Readmission Risk Interventions Readmission Risk Prevention Plan 07/08/2019 04/28/2019 04/11/2019  Transportation Screening Complete Complete Complete  Medication Review Press photographer) Complete Complete Complete  PCP or Specialist appointment within 3-5 days of discharge - Complete Complete  PCP/Specialist Appt Not Complete comments - - -  [place]RI or [place] - Complete Complete  SW Recovery Care/Counseling Consult Complete Complete Not Complete  SW Consult Not Complete Comments - - not appropriate  Palliative Care Screening Complete Complete Not Applicable  Skilled Nursing Facility - Complete Not Applicable  Some recent data might be hidden

## 2019-07-08 NOTE — Discharge Summary (Signed)
Stephanie Combs, is a 69 y.o. female  DOB 1950/08/14  MRN [id number].  Admission date:  07/06/2019  Admitting Physician  Bethena Roys, MD  Discharge Date:  07/08/2019   Primary MD  [name], MD  Recommendations for primary care physician for things to follow:    Transfer to residential hospice with complete comfort care  Admission Diagnosis  Hypokalemia [E87.6] Gastrointestinal hemorrhage with melena [K92.1] Symptomatic anemia [D64.9] Acute on chronic anemia [D64.9]   Discharge Diagnosis  Hypokalemia [E87.6] Gastrointestinal hemorrhage with melena [K92.1] Symptomatic anemia [D64.9] Acute on chronic anemia [D64.9]    Principal Problem:   Acute on chronic anemia Active Problems:   COPD (chronic obstructive pulmonary disease) ([place])   DM type 2 (diabetes mellitus, type 2) (HCC)   HTN (hypertension), benign   CKD (chronic kidney disease), stage III (HCC)   Chronic diastolic CHF (congestive heart failure) (HCC)   Type 2 diabetes mellitus with hypoglycemia without coma (HCC)   Chronic respiratory failure with hypoxia ([place])   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 Anxiety   . CAD (coronary artery disease)    a. s/p DES to LAD and angioplasty to D1 in 05/2017  . Cancer ([place])   . CHF (congestive heart failure) (HCC)    Diastolic  . Chronic back pain   . Chronic pain   . COPD (chronic obstructive pulmonary disease) ([place])   . Degenerative disk disease   . Diabetes mellitus without complication ([place])   . History of medication noncompliance 11/2017   "the patient frequently self-adjusts medications without notifying her physicians"  . Hypertension   . Hypothyroidism   . On home O2    2.5 L N/C prn  . Pedal edema     Past  Surgical History:  Procedure Laterality Date  . BIOPSY  09/09/2018   Procedure: BIOPSY;  Surgeon: Daneil Dolin, MD;  [facility];  Service: Endoscopy;;  gastric esophageal hepatic flexure colon  . COLONOSCOPY WITH PROPOFOL N/A 09/09/2018   hepatic flexure colon mass s/p biopsy, with polyps associated with tumor scattered all the way to the cecum not removed, multiple splenic flexure and rectal polyps removed, left-sided diverticulosis, ?right hemicolectomy. Hepatic flexure adenocarcinoma and additional adenocarcinoma in small splenic flexure polyp. Concern for synchronous colon cancer. High risk for surgical resection.   . CORONARY STENT INTERVENTION N/A 05/22/2017   Procedure: CORONARY STENT INTERVENTION;  Surgeon: Martinique, Peter M, MD;  Location: [facility];  Service: Cardiovascular;  Laterality: N/A;  . ESOPHAGOGASTRODUODENOSCOPY (EGD) WITH PROPOFOL N/A 09/09/2018   abnormal distal esophagus suspicious for Barrett's s/p biopsy, erosive gastropathy, duodenal bulbar erosions. +Barrett's  . HERNIA REPAIR    . POLYPECTOMY  09/09/2018   Procedure: POLYPECTOMY;  Surgeon: Daneil Dolin, MD;  [facility];  Service: Endoscopy;;  colon  . RIGHT/LEFT HEART CATH AND CORONARY ANGIOGRAPHY N/A 05/19/2017   Procedure: RIGHT/LEFT HEART CATH AND CORONARY ANGIOGRAPHY;  Surgeon: Leonie Man, MD;  Location: [facility];  Service: Cardiovascular;  Laterality: N/A;  . TUBAL LIGATION    . VIDEO BRONCHOSCOPY WITH ENDOBRONCHIAL ULTRASOUND Left 09/14/2018   Procedure: VIDEO BRONCHOSCOPY WITH ENDOBRONCHIAL ULTRASOUND with biopsies;  Surgeon: Margaretha Seeds, MD;  Location: [facility];  Service: Thoracic;  Laterality: Left;       HPI  from the history and physical done on the day of admission:     Chief Complaint: Low Hemoglobin  HPI: Stephanie Combs is a 69 y.o. female with medical history significant for unresectable colon cancer, metastatic lung cancer, COPD with chronic  respiratory failure, diastolic CHF.  Patient was brought to the ED from nursing home with reports of low hemoglobin of 5.5.  On my evaluation patient awake alert and confused.  She keeps saying she wants to stay in the hospital and she wants to go home.  She tells me she is confused all the time.  She says she has increasing difficulty breathing but this is unchanged, she reports always coughing, and always having dysuria.  Multiple recent hospitalizations-2 admissions in January for acute anemia.  Last hospitalization 1/28- 1/30, with hemoglobin of 4.1, transfused 3 units PRBC.  Discharge hemoglobin was 7.8.  ED Course: Tachycardic to 108, temperature 99.  Blood pressure 151/52.  O2 sats 95% on 3 L.  Hemoglobin 5.5.  Potassium 2.5.  Required 3 PRBCs ordered for transfusion.  Port chest xray- pneumonia or mass lesion/lesions.  Black tarry stool on rectal exam by EDP.  Hospitalist to admit for evaluation and management.  Review of Systems: As per HPI all other systems reviewed and negative    Hospital Course:       Brief Summary:- 69 y.o.femalewith medical history significant forunresectable colon cancer, metastatic lung cancer, COPD with chronic respiratory failure, diastolic CHF admitted with acute on chronic anemia requiring transfusion on 07/06/2019   A/p 1) acute on chronic anemia-- --hgb is up to 7.6 from 4.9 after transfusion of 2 units of packed cells 2 units of PRBC -Daughter requested no further transfusion  -patient with history of recurrent transfusions in the setting of unresectable metastatic colon malignancy as well as underlying 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 -See #1 above  5)Colorectal cancer, untreated: The patient is not a candidate for any additional chemotherapy.  -See #1 above   6)CKD:Stable, creatinine close to  baseline~-  Chronic lower extremity edema: This remained stable during her hospitalization. She had no signs of cellulitis. She will continue the Lasix for her edema.  1)OX0/RUE/AVWUJ (chronic diastolic dysfunction)-stable --palliative care input appreciated -Patient daughter requested transition to hospice/comfort care -Transfer to residential hospice with comfort care  Rheumatoid arthritis and chronic back pain: Resume opiates  Depression anxiety: Continue  topiramateand Xanax.  Social/Ethics--- palliative care consult appreciated Daughter request that no further transfusion after this admission -Patient to transition to palliative/hospice care at a residential hospice -[facility] working with palliative care team and daughter --palliative care input appreciated -Patient daughter requested transition to  hospice/comfort care -Transfer to residential hospice with comfort care  Discharge Condition: -Transfer to residential hospice  Follow UP  Contact information for after-discharge care    [place] .   Service: Inpatient Hospice Contact information: [address] [phone]              Diet and Activity recommendation:  As advised  Discharge Instructions    Discharge Instructions    Call MD for:  difficulty breathing, headache or visual disturbances   Complete by: As directed    Call MD for:  extreme fatigue   Complete by: As directed    Call MD for:  persistant dizziness or light-headedness   Complete by: As directed    Call MD for:  persistant nausea and vomiting   Complete by: As  directed    Call MD for:  severe uncontrolled pain   Complete by: As directed    Call MD for:  temperature >100.4   Complete by: As directed    Diet general   Complete by: As directed    Discharge instructions   Complete by: As directed    Transfer to residential hospice with complete comfort care   Increase activity slowly   Complete by: As directed         Discharge Medications     Allergies as of 07/08/2019      Reactions   Dilaudid [hydromorphone Hcl] Itching   Midodrine Hcl Swelling   After one dose had anaphylactic reaction, had to call EMS.   Actifed Cold-allergy [chlorpheniramine-phenylephrine]    "I was sick and red and it didn't agree with me at all"   Ambrosia Artemisiifolia (ragweed) Skin Test    Antihistamines, Chlorpheniramine-type    Doxycycline Nausea And Vomiting   Other Cough   Pt states she is allergic to ragweed and that she starts coughing and sneezing like crazy   Penicillins Hives, Itching   Tolerates Rocephin Has patient had a PCN reaction causing immediate rash, facial/tongue/throat swelling, SOB or lightheadedness with hypotension: Yes Has patient had a PCN reaction causing severe rash involving mucus membranes or skin necrosis: No Has patient had a PCN reaction that required hospitalization Yes Has patient had a PCN reaction occurring within the last 10 years: No If all of the above answers are "NO", then may proceed with Cephalosporin use.   Phenylephrine    Reglan [metoclopramide] Itching   Valium [diazepam] Itching   Vistaril [hydroxyzine Hcl] Itching      Medication List    STOP taking these medications   aspirin EC 81 MG tablet   atorvastatin 80 MG tablet Commonly known as: LIPITOR   busPIRone 7.5 MG tablet Commonly known as: BUSPAR   furosemide 40 MG tablet Commonly known as: LASIX   levothyroxine 200 MCG tablet Commonly known as: SYNTHROID   potassium chloride SA 20 MEQ tablet Commonly known as: KLOR-CON     TAKE these  medications   acetaminophen 325 MG tablet Commonly known as: TYLENOL Take 2 tablets (650 mg total) by mouth every 6 (six) hours as needed for mild pain (or Fever >/= 101).   Admelog 100 UNIT/ML injection Generic drug: insulin lispro Inject 1-9 Units into the skin See admin instructions. Inject as per sliding scale : If 121-150 = 1 unit, 151-200=2 units, 201-250=3 units, 251-300=5 units, 301-350=7 units, 351-400=9 units before meals and at bedtime.   albuterol (2.5 MG/3ML) 0.083% nebulizer solution Commonly known  as: PROVENTIL Take 2.5 mg by nebulization 2 (two) times daily as needed for wheezing or shortness of breath.   albuterol 108 (90 Base) MCG/ACT inhaler Commonly known as: ProAir HFA Inhale 2 puffs into the lungs every 4 (four) hours as needed for wheezing or shortness of breath. For shortness of breath   allopurinol 300 MG tablet Commonly known as: ZYLOPRIM Take 300 mg by mouth daily.   ALPRAZolam 1 MG tablet Commonly known as: XANAX Take 1 tablet (1 mg total) by mouth 3 (three) times daily as needed for anxiety or sleep. What changed: when to take this   Basaglar KwikPen 100 UNIT/ML Sopn Inject 34 Units into the skin at bedtime.   cholecalciferol 25 MCG (1000 UNIT) tablet Commonly known as: VITAMIN D3 Take 5,000 Units by mouth daily.   feeding supplement (ENSURE ENLIVE) Liqd Take 237 mLs by mouth 2 (two) times daily between meals.   feeding supplement (PRO-STAT SUGAR FREE 64) Liqd Take 30 mLs by mouth daily.   gabapentin 600 MG tablet Commonly known as: NEURONTIN Take 600 mg by mouth 3 (three) times daily.   hydrocortisone 2.5 % rectal cream Commonly known as: ANUSOL-HC Place 1 application rectally every 6 (six) hours as needed for hemorrhoids.   multivitamin with minerals Tabs tablet Take 1 tablet by mouth daily.   nitroGLYCERIN 0.4 MG SL tablet Commonly known as: NITROSTAT Place 0.4 mg under the tongue every 5 (five) minutes as needed for chest pain.     ondansetron 8 MG tablet Commonly known as: ZOFRAN Take 1 tablet (8 mg total) by mouth every 8 (eight) hours as needed for nausea or vomiting.   oxyCODONE 5 MG immediate release tablet Commonly known as: Oxy IR/ROXICODONE Take 1-2 tablets (5-10 mg total) by mouth every 6 (six) hours as needed for severe pain. What changed: how much to take   OXYGEN Inhale 2 L into the lungs continuous.   pantoprazole 40 MG tablet Commonly known as: PROTONIX Take 1 tablet (40 mg total) by mouth 2 (two) times daily.   polyethylene glycol 17 g packet Commonly known as: MIRALAX / GLYCOLAX Take 17 g by mouth 2 (two) times daily.   promethazine 12.5 MG tablet Commonly known as: PHENERGAN Take 1 tablet (12.5 mg total) by mouth every 6 (six) hours as needed for nausea or vomiting.   senna 8.6 MG Tabs tablet Commonly known as: SENOKOT Take 2 tablets (17.2 mg total) by mouth 2 (two) times daily. What changed: when to take this   silver sulfADIAZINE 1 % cream Commonly known as: SILVADENE Apply 1 application topically 2 (two) times daily.   simethicone 80 MG chewable tablet Commonly known as: MYLICON Chew 1 tablet (80 mg total) by mouth 3 (three) times daily as needed for flatulence.   sucralfate 1 g tablet Commonly known as: Carafate Take 1 tablet (1 g total) by mouth 4 (four) times daily -  with meals and at bedtime. 5 min before meals for radiation induced esophagitis What changed: when to take this   tiZANidine 4 MG tablet Commonly known as: Zanaflex Take 1 tablet (4 mg total) by mouth every 8 (eight) hours as needed for muscle spasms.   topiramate 25 MG tablet Commonly known as: TOPAMAX Take 75 mg by mouth at bedtime.   trolamine salicylate 10 % cream Commonly known as: Aspercreme/Aloe Apply 1 application topically as needed for muscle pain. What changed:   when to take this  additional instructions       Major  procedures and Radiology Reports - PLEASE review detailed and final  reports for all details, in brief -   DG Chest Port 1 View  Result Date: 07/06/2019 CLINICAL DATA:  Anemia. EXAM: PORTABLE CHEST 1 VIEW COMPARISON:  06/10/2019 FINDINGS: Patient rotated towards the right. Mild cardiomegaly. Mild patchy pulmonary densities in the mid and lower lungs that are nonspecific. This could represent patchy pneumonia. Mass lesion not excluded. Follow-up when able. IMPRESSION: Patchy densities in both mid and lower lungs. This could represent pneumonia or mass lesion/lesions. Follow-up when able. Electronically Signed   By: Nelson Chimes M.D.   On: 07/06/2019 20:33   DG Chest Port 1 View  Result Date: 06/10/2019 CLINICAL DATA:  Chest pain EXAM: PORTABLE CHEST 1 VIEW COMPARISON:  05/19/2019 FINDINGS: Previously seen patchy opacity in the left upper lobe has improved with mild residual density. Right lung clear. Heart borderline in size. No effusions or acute bony abnormality. IMPRESSION: Minimal residual left upper lobe opacity, improving since prior study. Electronically Signed   By: Rolm Baptise M.D.   On: 06/10/2019 01:04    Micro Results    Recent Results (from the past 240 hour(s))  SARS CORONAVIRUS 2 (TAT 6-24 HRS) Nasopharyngeal Nasopharyngeal Swab     Status: None   Collection Time: 07/06/19  7:33 PM   Specimen: Nasopharyngeal Swab  Result Value Ref Range Status   SARS Coronavirus 2 NEGATIVE NEGATIVE Final    Comment: (NOTE) SARS-CoV-2 target nucleic acids are NOT DETECTED. The SARS-CoV-2 RNA is generally detectable in upper and lower respiratory specimens during the acute phase of infection. Negative results do not preclude SARS-CoV-2 infection, do not rule out co-infections with other pathogens, and should not be used as the sole basis for treatment or other patient management decisions. Negative results must be combined with clinical observations, patient history, and epidemiological information. The expected result is Negative. Fact Sheet for  Patients: SugarRoll.be Fact Sheet for Healthcare Providers: https://www.woods-mathews.com/ This test is not yet approved or cleared by the [place] FDA and  has been authorized for detection and/or diagnosis of SARS-CoV-2 by FDA under an Emergency Use Authorization (EUA). This EUA will remain  in effect (meaning this test can be used) for the duration of the COVID-19 declaration under Section 56 4(b)(1) of the Act, 21 U.S.C. section 360bbb-3(b)(1), unless the authorization is terminated or revoked sooner. Performed at [facility], [place] [address] [place], [place] [address]   Culture, Urine     Status: Abnormal (Preliminary result)   Collection Time: 07/07/19  6:30 AM   Specimen: Urine, Clean Catch  Result Value Ref Range Status   Specimen Description   Final    URINE, CLEAN CATCH Performed at [facility], [address], [place], [place] [address]    Special Requests   Final    NONE Performed at [facility], [address] [place], [place] [address]    Culture (A)  Final    >=100,000 COLONIES/mL GRAM NEGATIVE RODS IDENTIFICATION AND SUSCEPTIBILITIES TO FOLLOW Performed at [facility], [place] [address], [place], [place] [address]    Report Status PENDING  Incomplete    Today   Subjective    Stephanie Combs today has no new complaints, resting comfortably          Patient has been seen and examined prior to discharge   Objective   Blood pressure 123/65, pulse 81, temperature 98.2 F (36.8 C), temperature source Oral, resp. rate 18, height 5\' 6"  (1.676  m), weight 104.1 kg, SpO2 99 %.   Intake/Output Summary (Last 24 hours) at 07/08/2019 1228 Last data filed at 07/08/2019 0900 Gross per 24 hour  Intake 720 ml  Output 1000 ml  Net -280 ml    Exam Gen:- Awake Alert, no acute distress  HEENT:- [place].AT, No sclera icterus Neck-Supple Neck,No JVD,.  Lungs-  CTAB , good air movement bilaterally  CV- S1, S2 normal,  regular Abd-  +ve B.Sounds, Abd Soft, No tenderness, no CVA tenderness Extremity/Skin:- No  edema,   good pulses Psych-affect is appropriate, oriented x3, episodes of confusion and disorientation persist from time to time Neuro-generalized weakness, no new focal deficits, no tremors    Data Review   CBC w Diff:  Lab Results  Component Value Date   WBC 9.8 07/07/2019   HGB 7.6 (L) 07/07/2019   HGB 9.3 (L) 03/09/2019   HCT 25.3 (L) 07/07/2019   HCT 31.0 (L) 02/01/2017   PLT 303 07/07/2019   PLT 211 03/09/2019   LYMPHOPCT 6 07/06/2019   MONOPCT 5 07/06/2019   EOSPCT 0 07/06/2019   BASOPCT 0 07/06/2019    CMP:  Lab Results  Component Value Date   NA 144 07/07/2019   K 3.1 (L) 07/07/2019   CL 106 07/07/2019   CO2 24 07/07/2019   BUN 90 (H) 07/07/2019   BUN 28 (A) 11/11/2016   CREATININE 1.57 (H) 07/07/2019   CREATININE 1.44 (H) 03/09/2019   PROT 6.6 07/06/2019   ALBUMIN 3.1 (L) 07/06/2019   BILITOT 0.5 07/06/2019   BILITOT <0.2 (L) 03/09/2019   ALKPHOS 95 07/06/2019   AST 18 07/06/2019   AST 9 (L) 03/09/2019   ALT 14 07/06/2019   ALT 7 03/09/2019  .   Total Discharge time is about 33 minutes  Roxan Hockey M.D on 07/08/2019 at 12:28 PM  Go to [email or web address] -  for contact info  [facility] - Office  [phone]

## 2019-07-09 LAB — URINE CULTURE: Culture: >=100000 — AB

## 2019-07-15 ENCOUNTER — Telehealth: Payer: Self-pay

## 2019-07-15 NOTE — Telephone Encounter (Signed)
SW contacted facility, confirmed patient had been transferred to inpatient hospice in [facility] for end-of-life/comfort care. SW notified palliative admin.

## 2019-08-11 DEATH — deceased

## 2021-01-05 NOTE — Telephone Encounter (Signed)
This encounter was created in error - please disregard.
